# Patient Record
Sex: Female | Born: 1950
Health system: Southern US, Community
[De-identification: ages and names within clinical notes are randomized; demographics above are authoritative.]

## PROBLEM LIST (undated history)

## (undated) DIAGNOSIS — C801 Malignant (primary) neoplasm, unspecified: Secondary | ICD-10-CM

## (undated) DIAGNOSIS — E119 Type 2 diabetes mellitus without complications: Secondary | ICD-10-CM

## (undated) DIAGNOSIS — J45909 Unspecified asthma, uncomplicated: Secondary | ICD-10-CM

## (undated) DIAGNOSIS — R002 Palpitations: Secondary | ICD-10-CM

## (undated) DIAGNOSIS — L309 Dermatitis, unspecified: Secondary | ICD-10-CM

## (undated) DIAGNOSIS — R0602 Shortness of breath: Secondary | ICD-10-CM

## (undated) DIAGNOSIS — M1990 Unspecified osteoarthritis, unspecified site: Secondary | ICD-10-CM

## (undated) DIAGNOSIS — C50919 Malignant neoplasm of unspecified site of unspecified female breast: Secondary | ICD-10-CM

## (undated) DIAGNOSIS — K219 Gastro-esophageal reflux disease without esophagitis: Secondary | ICD-10-CM

## (undated) DIAGNOSIS — M8580 Other specified disorders of bone density and structure, unspecified site: Secondary | ICD-10-CM

## (undated) DIAGNOSIS — Z9221 Personal history of antineoplastic chemotherapy: Secondary | ICD-10-CM

## (undated) DIAGNOSIS — E7800 Pure hypercholesterolemia, unspecified: Secondary | ICD-10-CM

## (undated) DIAGNOSIS — C7800 Secondary malignant neoplasm of unspecified lung: Secondary | ICD-10-CM

## (undated) HISTORY — DX: Type 2 diabetes mellitus without complications: E11.9

## (undated) HISTORY — DX: Malignant (primary) neoplasm, unspecified: C80.1

## (undated) HISTORY — PX: MASTECTOMY: SHX3

## (undated) HISTORY — DX: Unspecified osteoarthritis, unspecified site: M19.90

## (undated) HISTORY — DX: Other specified disorders of bone density and structure, unspecified site: M85.80

---

## 1994-07-29 HISTORY — PX: SPINE SURGERY: SHX786

## 2003-07-30 DIAGNOSIS — C801 Malignant (primary) neoplasm, unspecified: Secondary | ICD-10-CM

## 2003-07-30 HISTORY — DX: Malignant (primary) neoplasm, unspecified: C80.1

## 2003-07-30 HISTORY — PX: BREAST SURGERY: SHX581

## 2004-03-01 ENCOUNTER — Encounter: Admission: RE | Admit: 2004-03-01 | Discharge: 2004-03-01 | Payer: Self-pay | Admitting: Family Medicine

## 2004-03-01 ENCOUNTER — Encounter (INDEPENDENT_AMBULATORY_CARE_PROVIDER_SITE_OTHER): Payer: Self-pay | Admitting: *Deleted

## 2004-03-12 ENCOUNTER — Encounter (HOSPITAL_COMMUNITY): Admission: RE | Admit: 2004-03-12 | Discharge: 2004-04-03 | Payer: Self-pay | Admitting: General Surgery

## 2004-03-20 ENCOUNTER — Encounter: Admission: RE | Admit: 2004-03-20 | Discharge: 2004-03-20 | Payer: Self-pay | Admitting: General Surgery

## 2004-03-22 ENCOUNTER — Ambulatory Visit (HOSPITAL_BASED_OUTPATIENT_CLINIC_OR_DEPARTMENT_OTHER): Admission: RE | Admit: 2004-03-22 | Discharge: 2004-03-22 | Payer: Self-pay | Admitting: General Surgery

## 2004-03-22 ENCOUNTER — Ambulatory Visit (HOSPITAL_COMMUNITY): Admission: RE | Admit: 2004-03-22 | Discharge: 2004-03-22 | Payer: Self-pay | Admitting: General Surgery

## 2004-03-22 ENCOUNTER — Encounter (INDEPENDENT_AMBULATORY_CARE_PROVIDER_SITE_OTHER): Payer: Self-pay | Admitting: *Deleted

## 2004-04-11 ENCOUNTER — Encounter: Payer: Self-pay | Admitting: Cardiovascular Disease

## 2004-04-11 ENCOUNTER — Ambulatory Visit: Admission: RE | Admit: 2004-04-11 | Discharge: 2004-04-11 | Payer: Self-pay | Admitting: Oncology

## 2004-04-16 ENCOUNTER — Ambulatory Visit (HOSPITAL_COMMUNITY): Admission: RE | Admit: 2004-04-16 | Discharge: 2004-04-16 | Payer: Self-pay | Admitting: Oncology

## 2004-04-16 ENCOUNTER — Ambulatory Visit: Admission: RE | Admit: 2004-04-16 | Discharge: 2004-06-05 | Payer: Self-pay | Admitting: Radiation Oncology

## 2004-05-02 ENCOUNTER — Ambulatory Visit (HOSPITAL_COMMUNITY): Admission: RE | Admit: 2004-05-02 | Discharge: 2004-05-02 | Payer: Self-pay | Admitting: Family Medicine

## 2004-05-02 ENCOUNTER — Ambulatory Visit (HOSPITAL_COMMUNITY): Admission: RE | Admit: 2004-05-02 | Discharge: 2004-05-02 | Payer: Self-pay | Admitting: Oncology

## 2004-05-07 ENCOUNTER — Ambulatory Visit (HOSPITAL_BASED_OUTPATIENT_CLINIC_OR_DEPARTMENT_OTHER): Admission: RE | Admit: 2004-05-07 | Discharge: 2004-05-07 | Payer: Self-pay | Admitting: General Surgery

## 2004-05-07 ENCOUNTER — Encounter (INDEPENDENT_AMBULATORY_CARE_PROVIDER_SITE_OTHER): Payer: Self-pay | Admitting: Specialist

## 2004-05-07 ENCOUNTER — Ambulatory Visit (HOSPITAL_COMMUNITY): Admission: RE | Admit: 2004-05-07 | Discharge: 2004-05-07 | Payer: Self-pay | Admitting: General Surgery

## 2004-05-10 ENCOUNTER — Encounter: Admission: RE | Admit: 2004-05-10 | Discharge: 2004-05-10 | Payer: Self-pay | Admitting: General Surgery

## 2004-05-22 ENCOUNTER — Encounter: Admission: RE | Admit: 2004-05-22 | Discharge: 2004-06-13 | Payer: Self-pay | Admitting: General Surgery

## 2004-05-31 ENCOUNTER — Ambulatory Visit: Payer: Self-pay | Admitting: Oncology

## 2004-07-16 ENCOUNTER — Ambulatory Visit: Payer: Self-pay | Admitting: Oncology

## 2004-09-03 ENCOUNTER — Ambulatory Visit: Payer: Self-pay | Admitting: Oncology

## 2004-10-23 ENCOUNTER — Ambulatory Visit (HOSPITAL_BASED_OUTPATIENT_CLINIC_OR_DEPARTMENT_OTHER): Admission: RE | Admit: 2004-10-23 | Discharge: 2004-10-23 | Payer: Self-pay | Admitting: General Surgery

## 2005-01-03 ENCOUNTER — Ambulatory Visit: Payer: Self-pay | Admitting: Oncology

## 2005-03-05 ENCOUNTER — Encounter: Admission: RE | Admit: 2005-03-05 | Discharge: 2005-03-05 | Payer: Self-pay | Admitting: Oncology

## 2005-04-04 ENCOUNTER — Ambulatory Visit: Payer: Self-pay | Admitting: Oncology

## 2005-06-21 ENCOUNTER — Ambulatory Visit: Payer: Self-pay | Admitting: Oncology

## 2005-08-01 ENCOUNTER — Encounter: Admission: RE | Admit: 2005-08-01 | Discharge: 2005-08-01 | Payer: Self-pay | Admitting: Family Medicine

## 2005-10-02 IMAGING — CR DG CHEST 2V
2 series · 2 of 2 positions shown · non-contrast
Comparison: none

CLINICAL DATA: Pre-op respiratory exam prior to right breast surgery for breast carcinoma. 
 CHEST X-RAY: 
 The heart size and mediastinal contours are unremarkable.  The lungs are clear.  The visualized skeleton is unremarkable.

[view not recorded (1 of 2)]
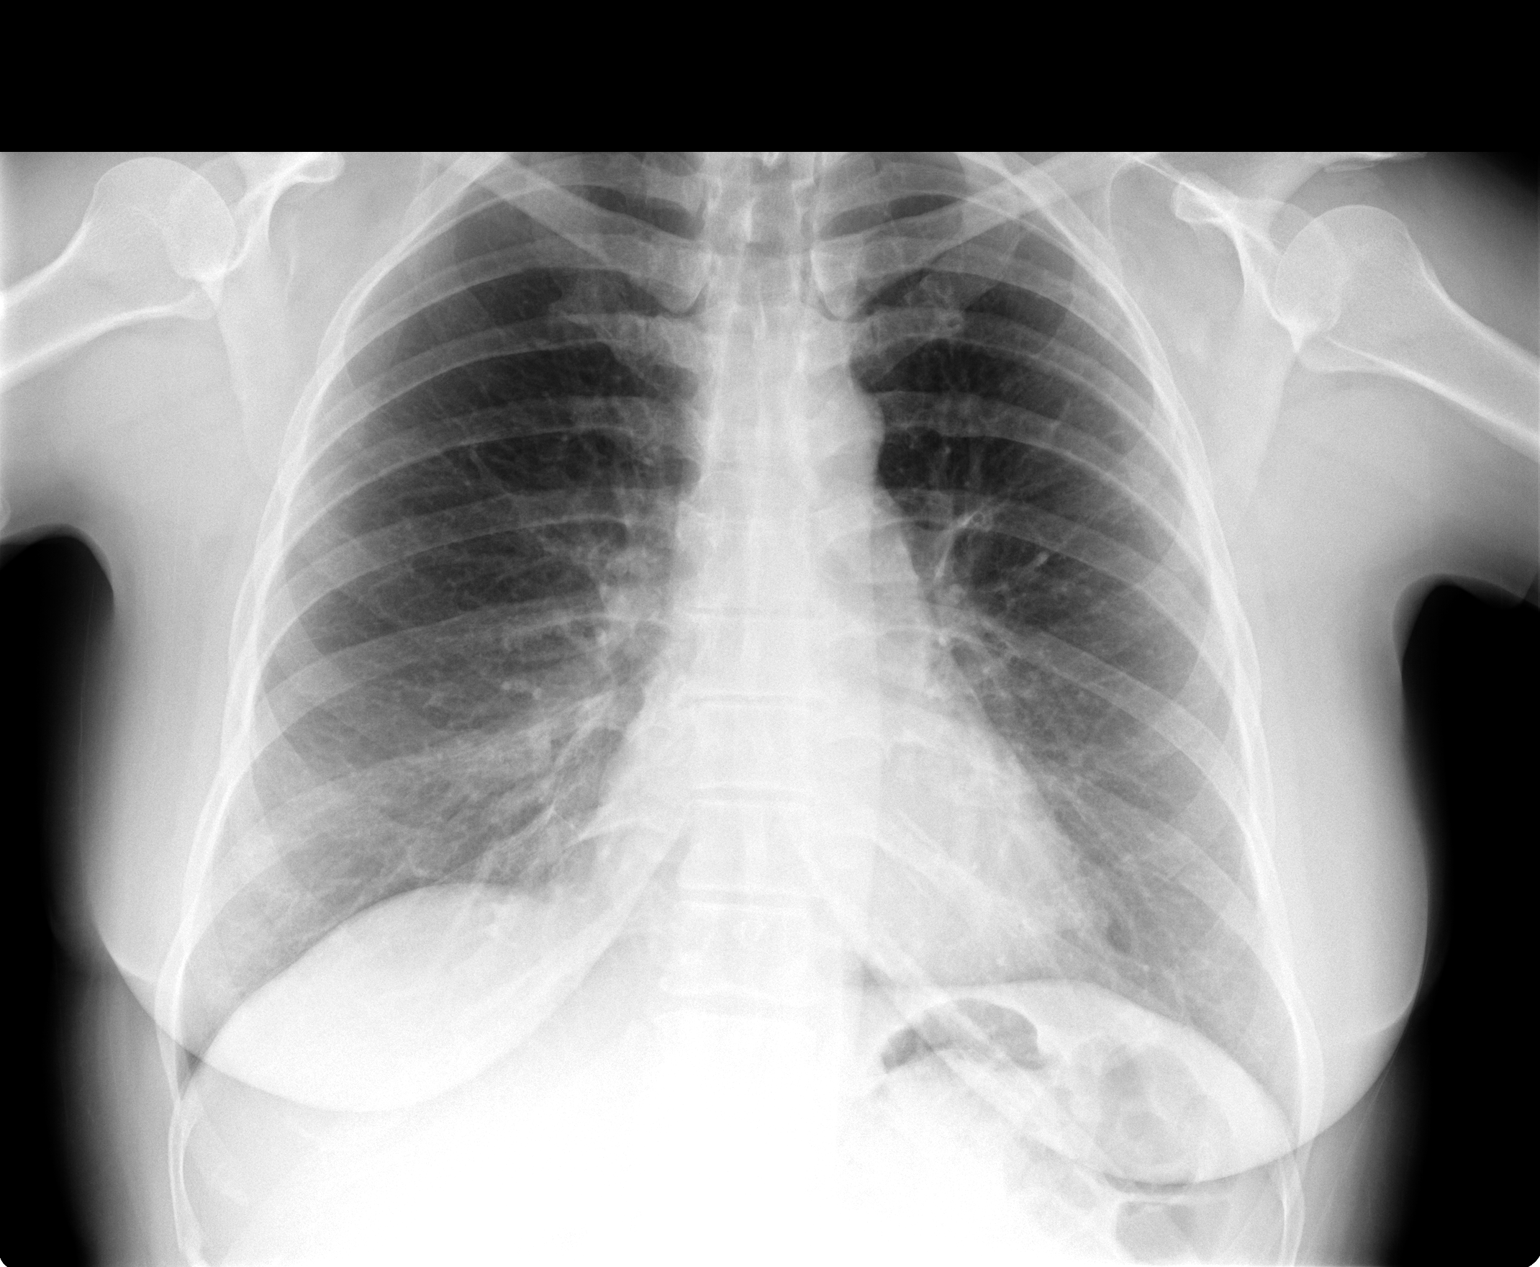

[view not recorded (2 of 2)]
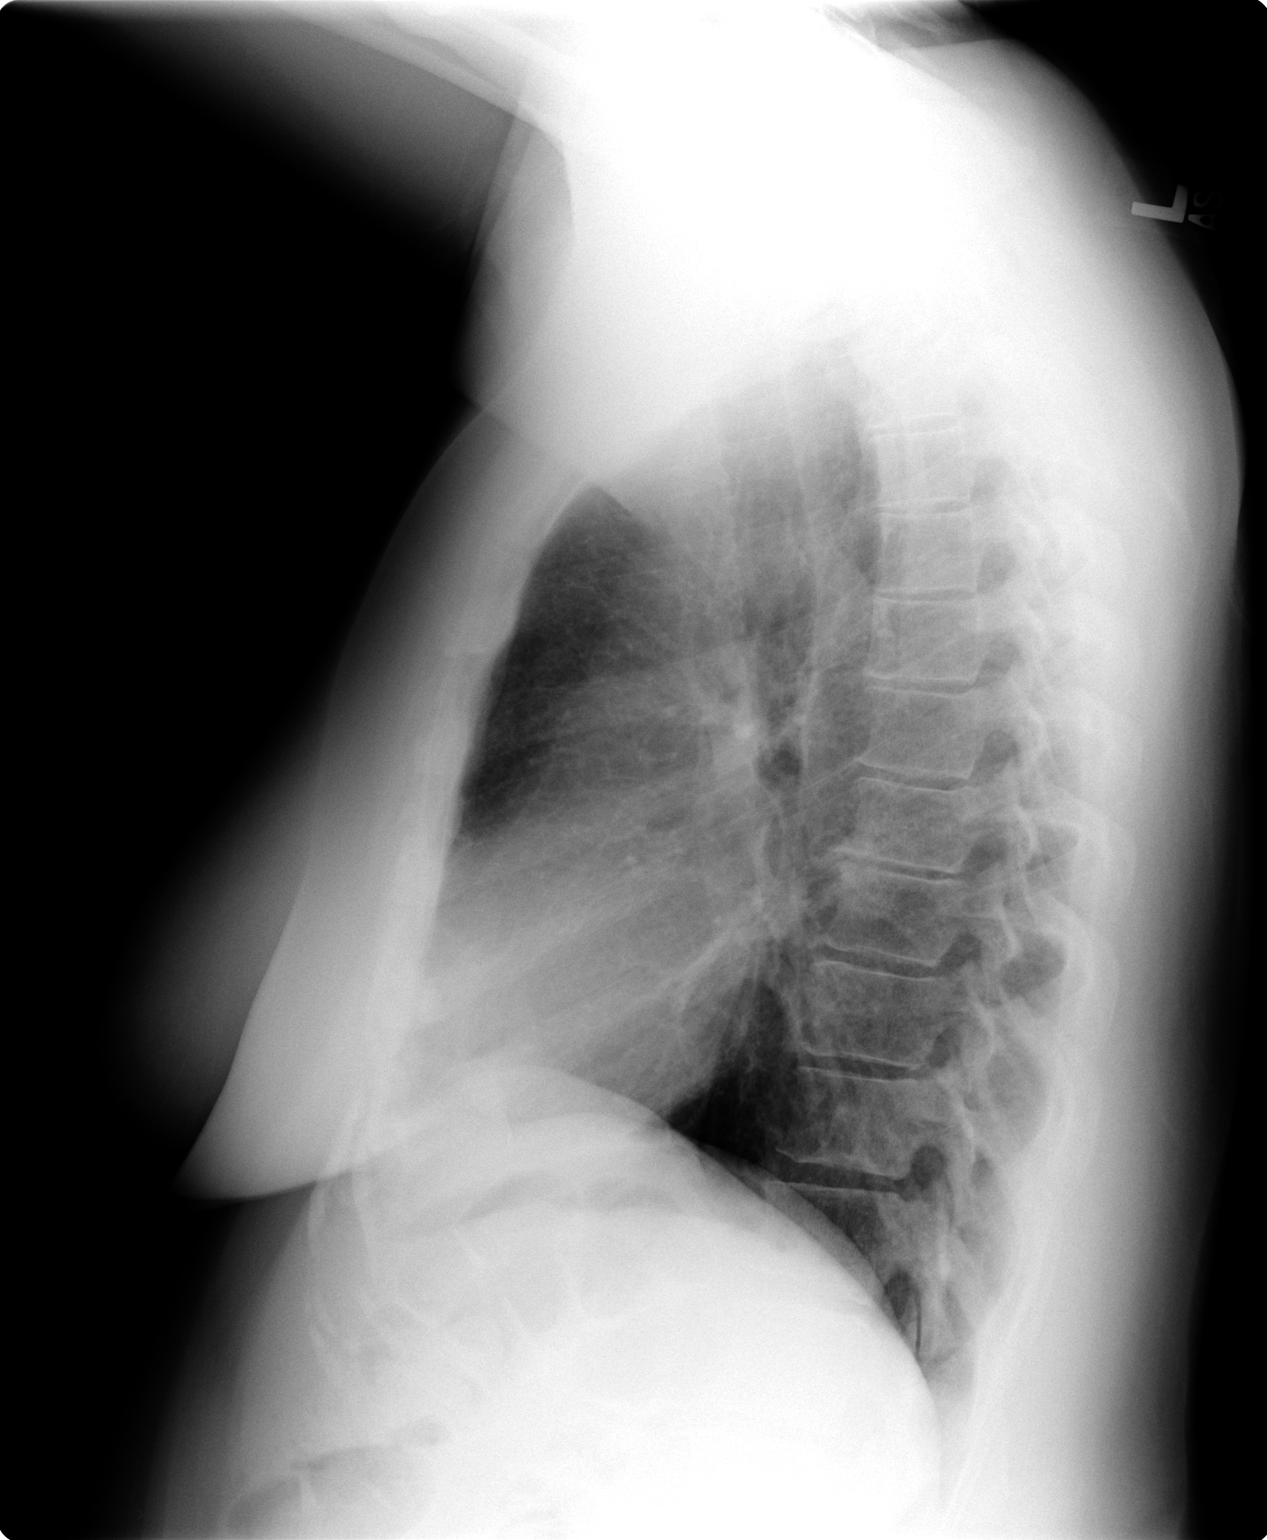

[2 of 2 positions shown; findings below may reference images not displayed]

IMPRESSION: No active lung disease.

## 2005-10-29 IMAGING — CT CT PELVIS W/ CM
1 of 4 series · 13 of 32 positions shown, 19 images · IV contrast (omnipaque)
Comparison: none

CLINICAL DATA: Breast carcinoma.  Staging. 
CT SCAN OF THE CHEST, ABDOMEN, AND PELVIS WITH CONTRAST
TECHNIQUE: Multidetector helical CT scanning obtained through the chest, abdomen, and pelvis following the administration of oral contrast and 150 cc of intravenous Omnipaque 300.  No immediate adverse reaction.

[Series 6: cap w/iv 5.0 b30f · axial · 0.74mm/px · z∈[-604,-84]mm · 13 of 122 slices shown, 19 images]
[im 9/122  soft-tissue]
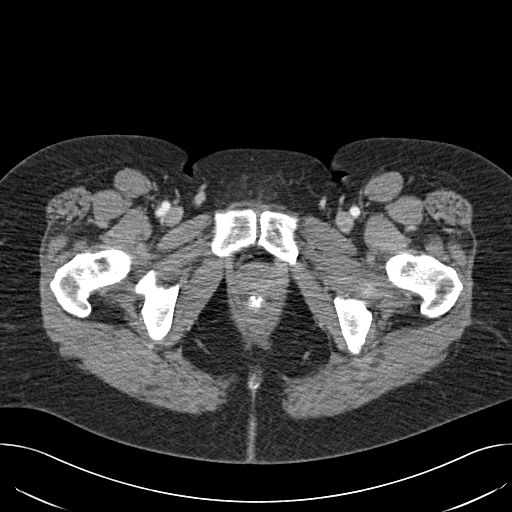
[im 9/122  bone]
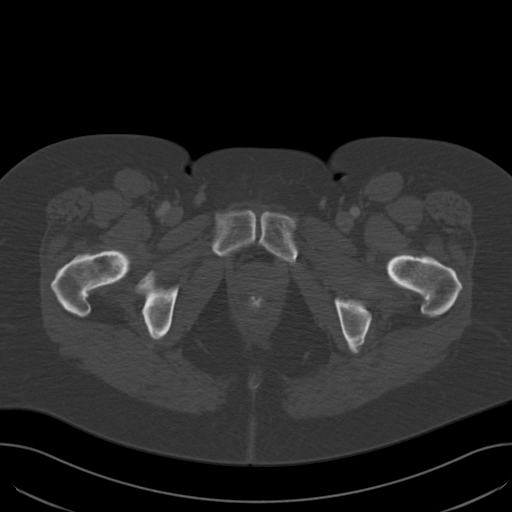
[im 17/122  soft-tissue]
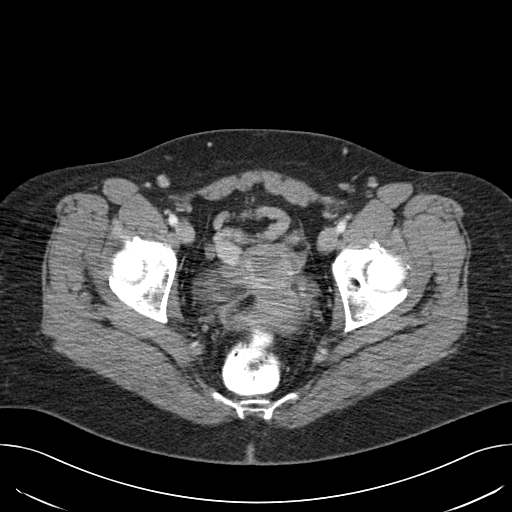
[im 25/122  soft-tissue]
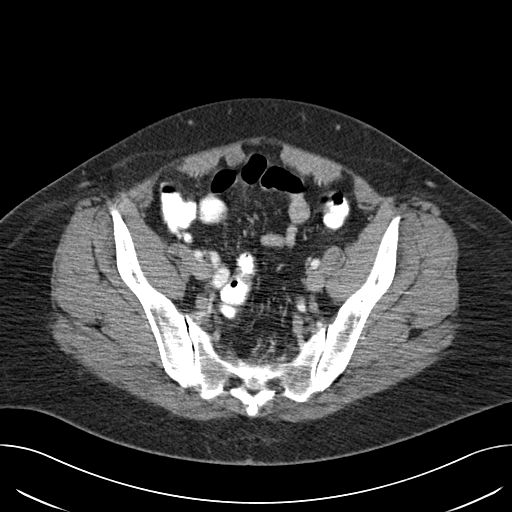
[im 33/122  soft-tissue]
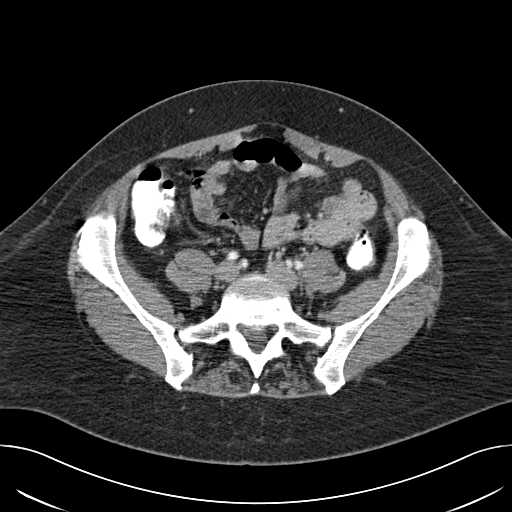
[im 41/122  soft-tissue]
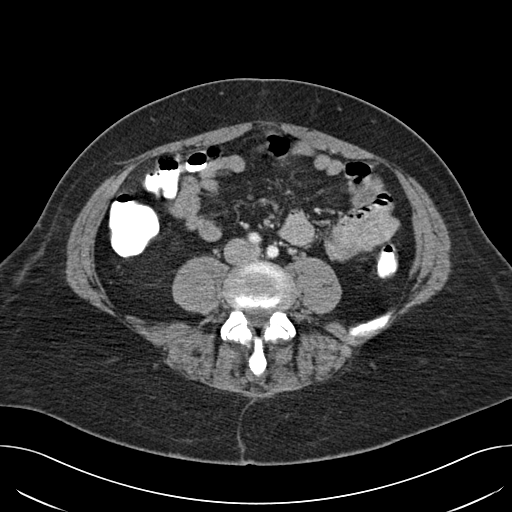
[im 49/122  soft-tissue]
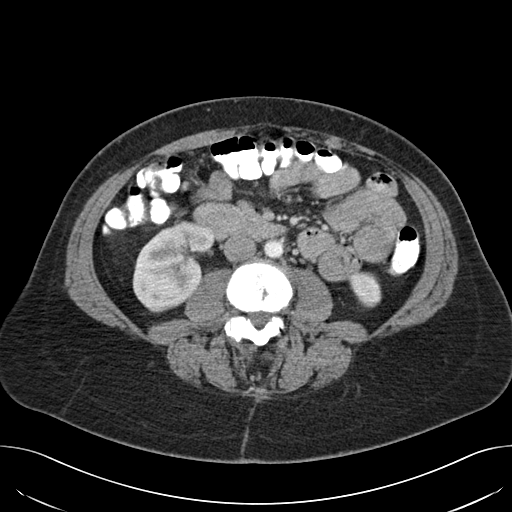
[im 65/122  soft-tissue]
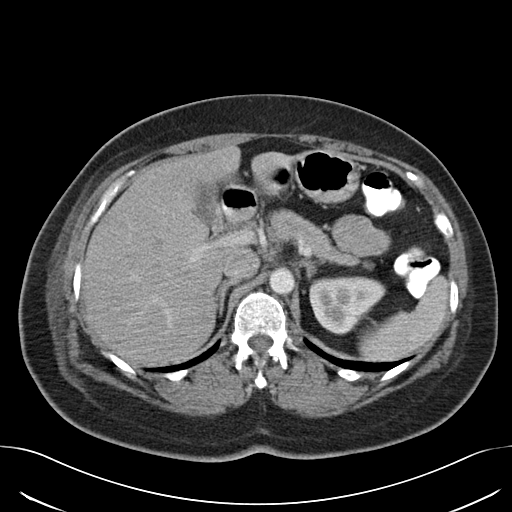
[im 73/122  soft-tissue]
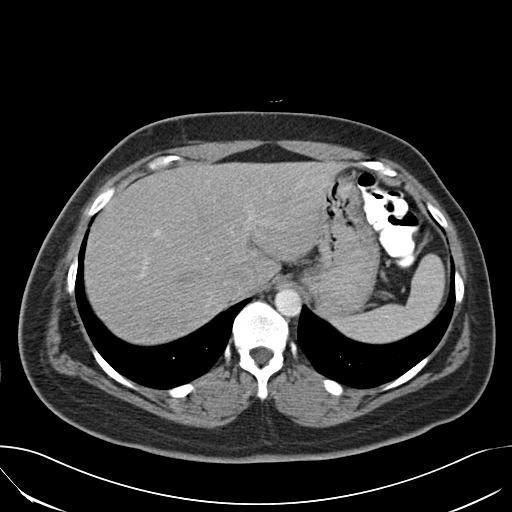
[im 81/122  soft-tissue]
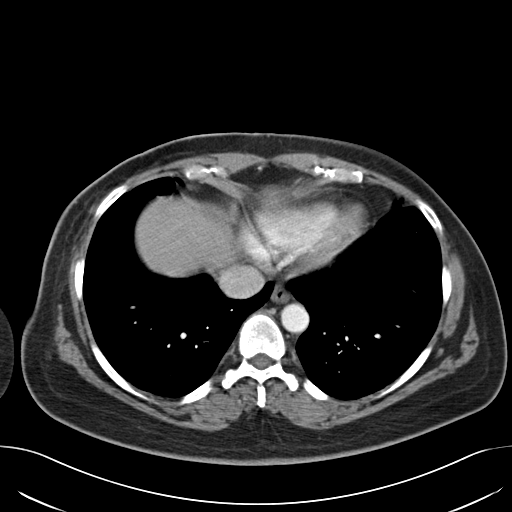
[im 81/122  bone]
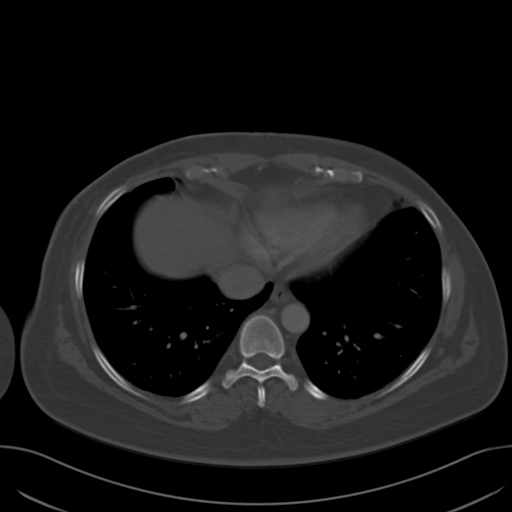
[im 89/122  soft-tissue]
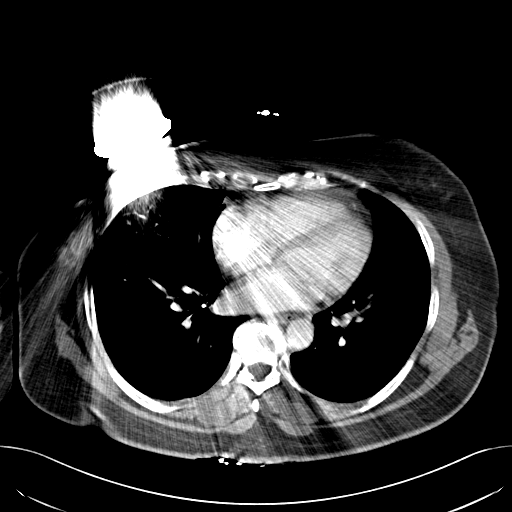
[im 89/122  lung]
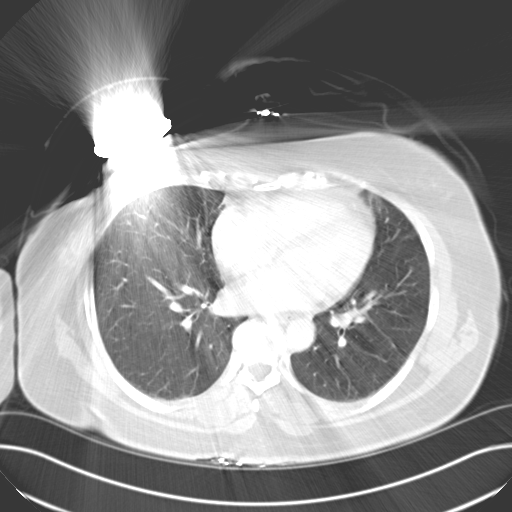
[im 97/122  soft-tissue]
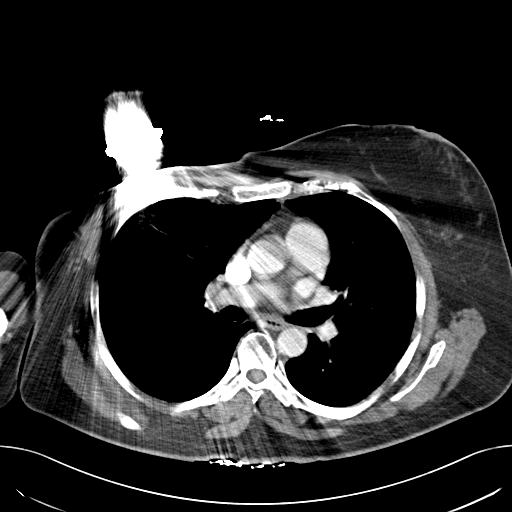
[im 97/122  lung]
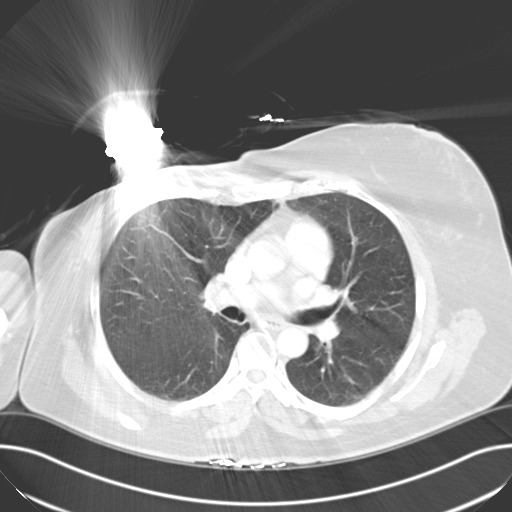
[im 105/122  soft-tissue]
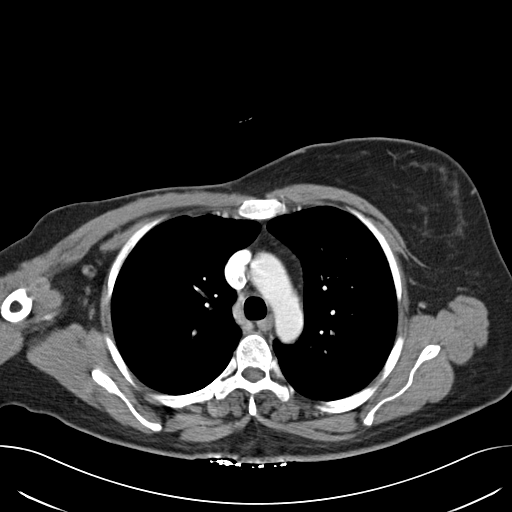
[im 105/122  lung]
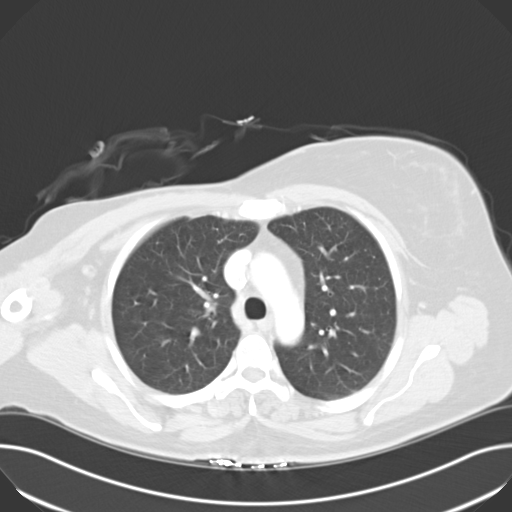
[im 113/122  soft-tissue]
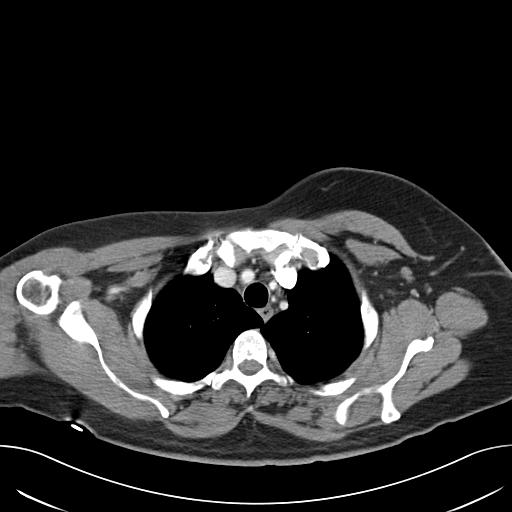
[im 113/122  lung]
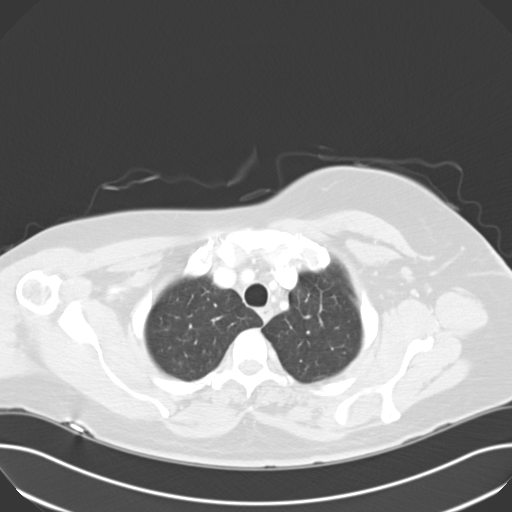

[13 of 32 positions shown; findings below may reference images not displayed]

FINDINGS: CT CHEST WITH CONTRAST 
The patient is status-post right mastectomy.  The heart and great vessels are unremarkable.  No evidence of pleural or pericardial effusions. No enlarged lymph nodes identified.  Postoperative changes in the right chest wall noted.  No focal bony lesion is identified.  The lungs are clear without evidence of pulmonary nodules or masses.  
IMPRESSION
Status-post right mastectomy.
No evidence of metastatic disease. 
CT ABDOMEN 
The liver, spleen, pancreas, kidneys, adrenal glands, gallbladder, and pancreas are unremarkable.  No evidence of free fluid, enlarged lymph nodes, or abdominal aortic aneurysm.  Visualized bowel is unremarkable.  
IMPRESSION
No acute abnormality.  Specifically, no evidence of metastatic disease. 
CT PELVIS 
The uterus is diminutive.   Adnexal regions are grossly unremarkable.  The visualized bowel and bladder are unremarkable as is the appendix.  No free fluid or enlarged lymph nodes.  Area of sclerosis within the anterior aspect of the left ileum noted.  
IMPRESSION
Nonspecific sclerosis within the anterior left iliac bone.  Consider follow up or further evaluation.
No acute abnomality.

## 2005-10-29 IMAGING — CT CT HEAD W/ CM
1 series · 15 of 26 positions shown, 19 images · IV contrast (omnipaque)
Comparison: none

CLINICAL DATA: Breast carcinoma.  Staging. 
CT SCAN OF THE CHEST, ABDOMEN, AND PELVIS WITH CONTRAST
TECHNIQUE: Multidetector helical CT scanning obtained through the chest, abdomen, and pelvis following the administration of oral contrast and 150 cc of intravenous Omnipaque 300.  No immediate adverse reaction.

[Series 2: head routi 5.0 h30s · axial · 0.43mm/px · z∈[-111,+4]mm · 15 of 26 slices shown, 19 images]
[im 2/26  brain]
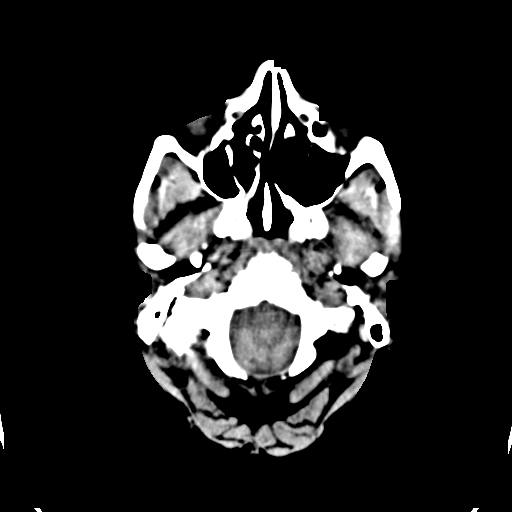
[im 2/26  bone]
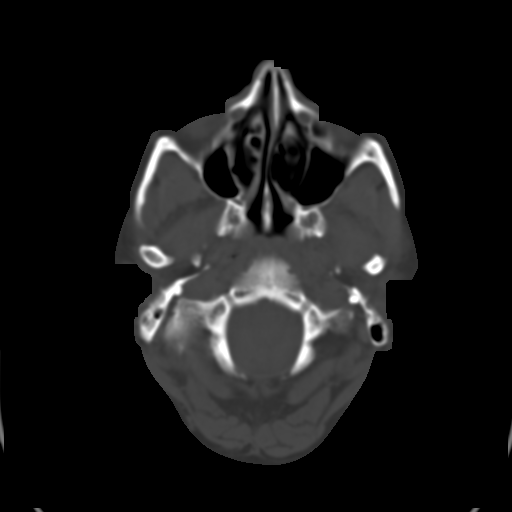
[im 4/26  brain]
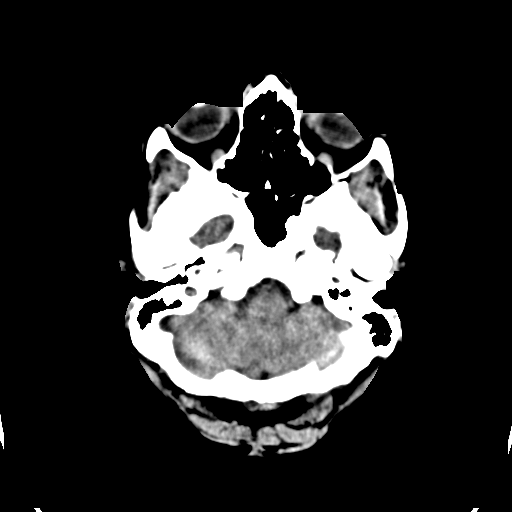
[im 6/26  brain]
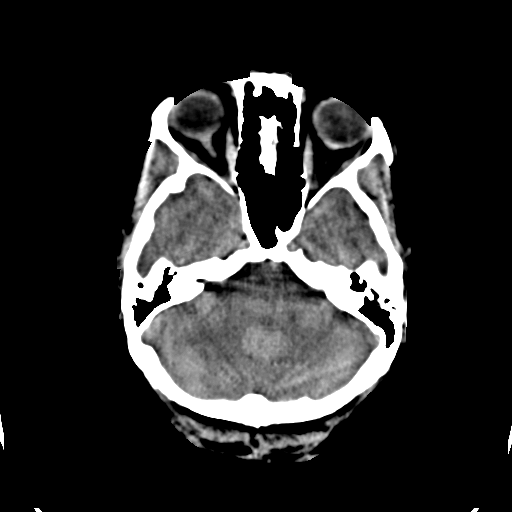
[im 7/26  brain]
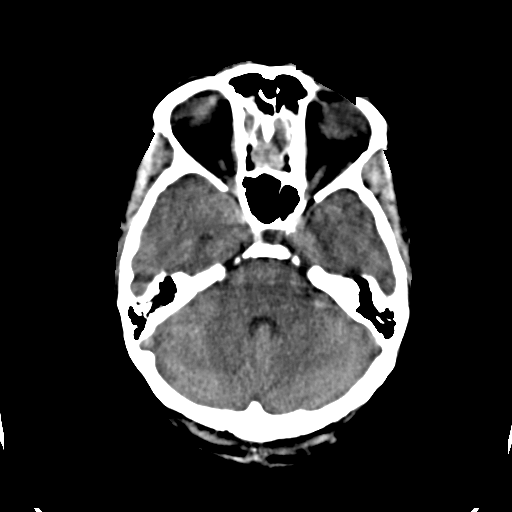
[im 9/26  brain]
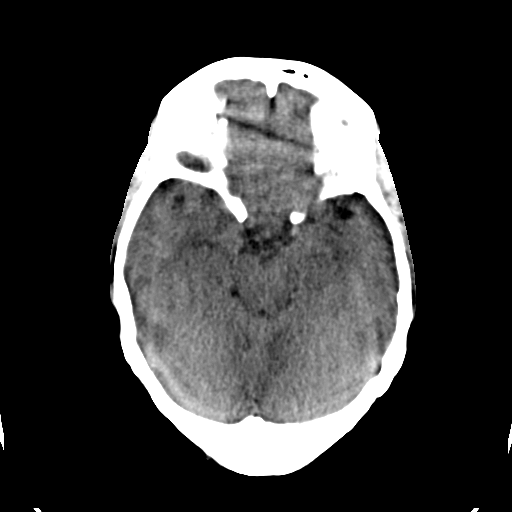
[im 9/26  bone]
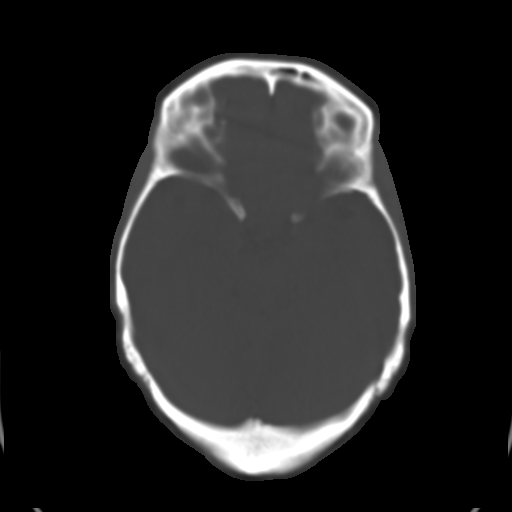
[im 10/26  brain]
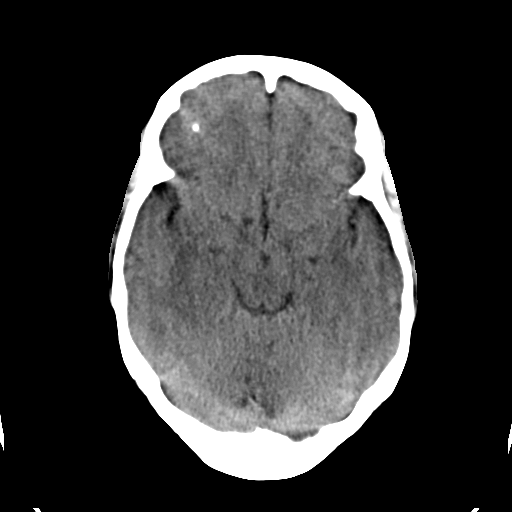
[im 12/26  brain]
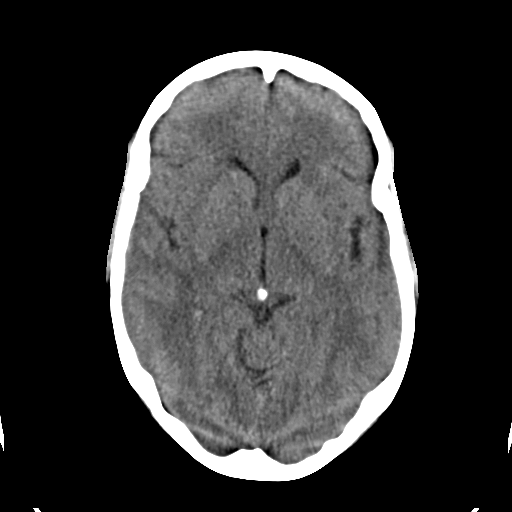
[im 14/26  brain]
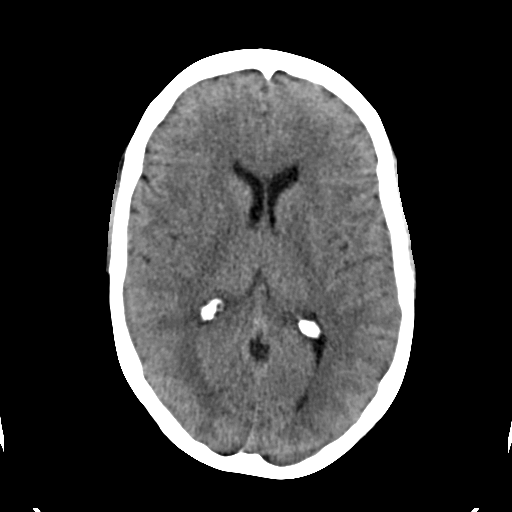
[im 15/26  brain]
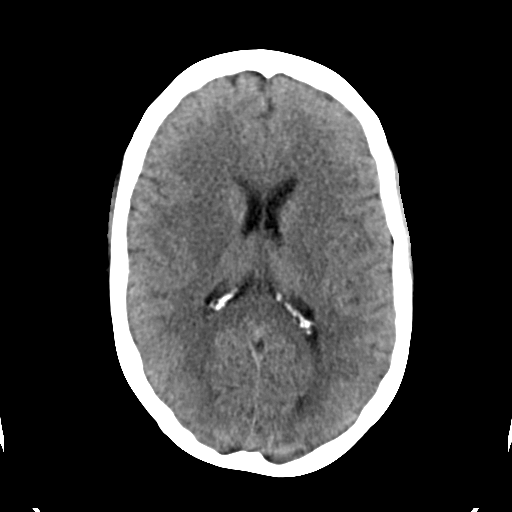
[im 15/26  bone]
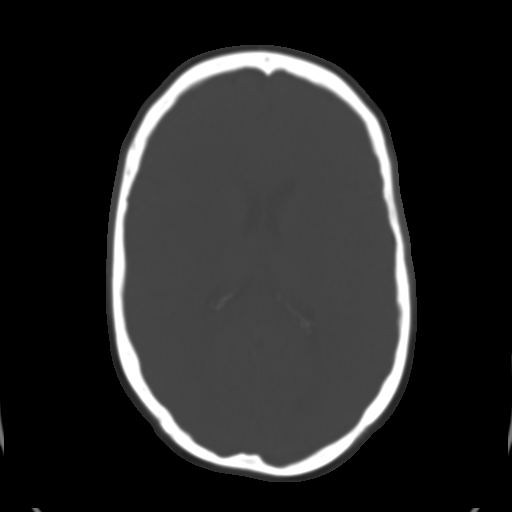
[im 17/26  brain]
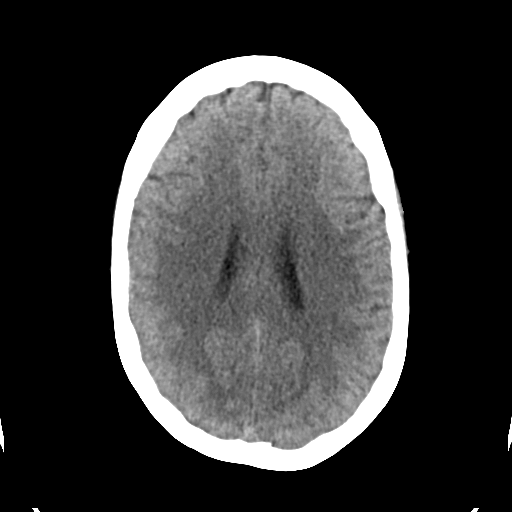
[im 18/26  brain]
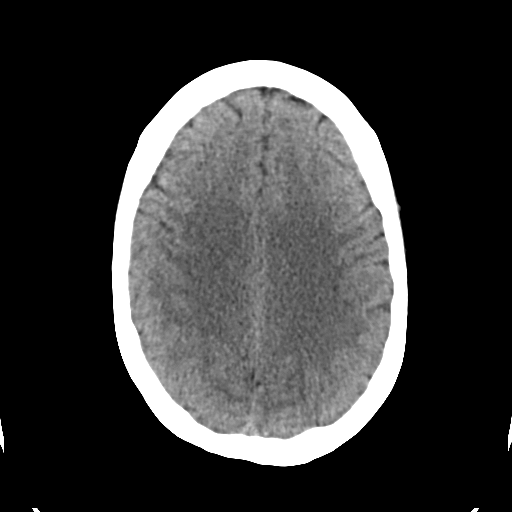
[im 20/26  brain]
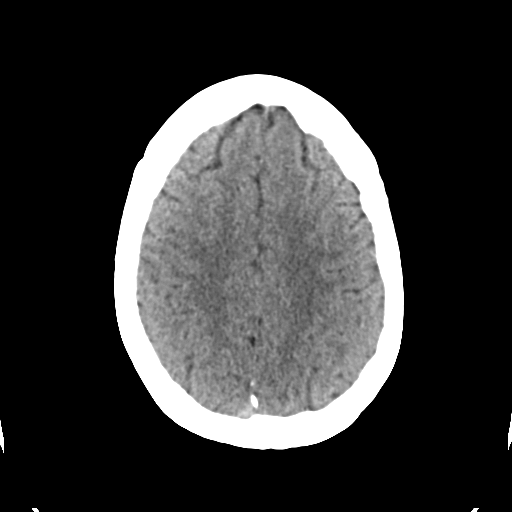
[im 22/26  brain]
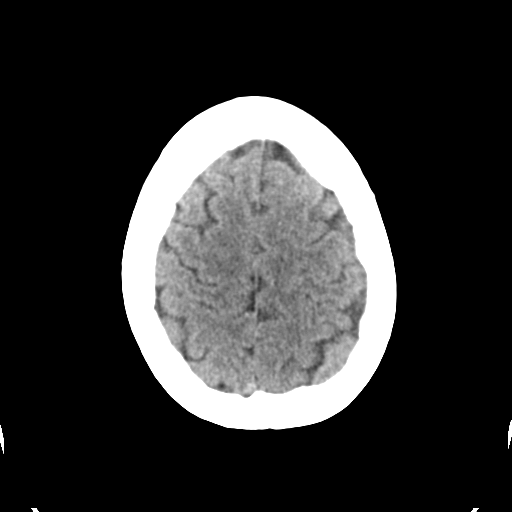
[im 22/26  bone]
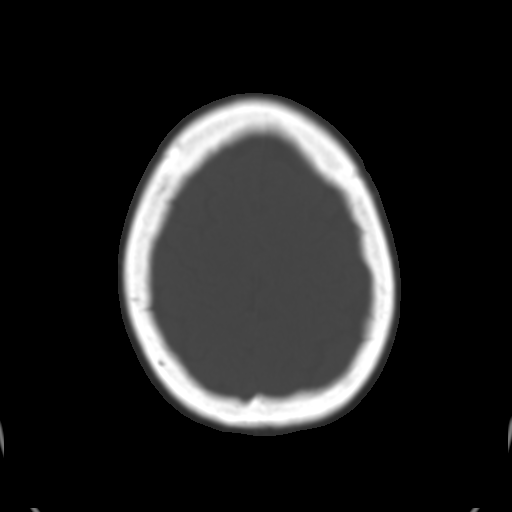
[im 23/26  brain]
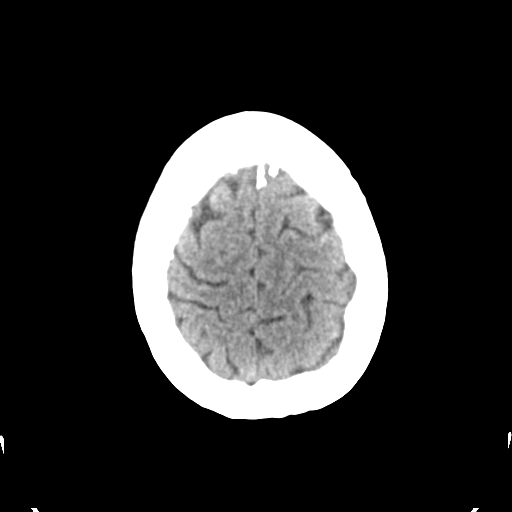
[im 25/26  brain]
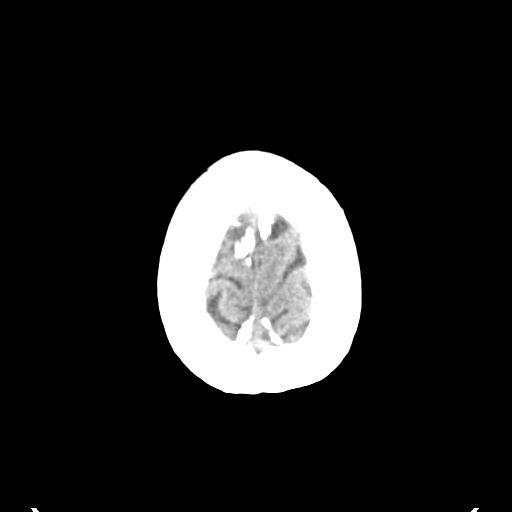

[15 of 26 positions shown; findings below may reference images not displayed]

FINDINGS: CT CHEST WITH CONTRAST 
The patient is status-post right mastectomy.  The heart and great vessels are unremarkable.  No evidence of pleural or pericardial effusions. No enlarged lymph nodes identified.  Postoperative changes in the right chest wall noted.  No focal bony lesion is identified.  The lungs are clear without evidence of pulmonary nodules or masses.  
IMPRESSION
Status-post right mastectomy.
No evidence of metastatic disease. 
CT ABDOMEN 
The liver, spleen, pancreas, kidneys, adrenal glands, gallbladder, and pancreas are unremarkable.  No evidence of free fluid, enlarged lymph nodes, or abdominal aortic aneurysm.  Visualized bowel is unremarkable.  
IMPRESSION
No acute abnormality.  Specifically, no evidence of metastatic disease. 
CT PELVIS 
The uterus is diminutive.   Adnexal regions are grossly unremarkable.  The visualized bowel and bladder are unremarkable as is the appendix.  No free fluid or enlarged lymph nodes.  Area of sclerosis within the anterior aspect of the left ileum noted.  
IMPRESSION
Nonspecific sclerosis within the anterior left iliac bone.  Consider follow up or further evaluation.
No acute abnomality.

## 2005-11-14 IMAGING — MR MR PELVIS W/O CM
4 of 5 series · 19 of 48 positions shown · non-contrast
Comparison: CT scan 04/16/2004, whole-body bone scan 04/16/2004.

CLINICAL DATA: Abnormal bone scan; history breast cancer

MRI PELVIS WITHOUT CONTRAST

[Series 2: T1 · axial · 4.0mm · 0.84mm/px · z∈[-114,+71]mm · 9 of 38 slices shown (1 of 2)]
[im 1/38]
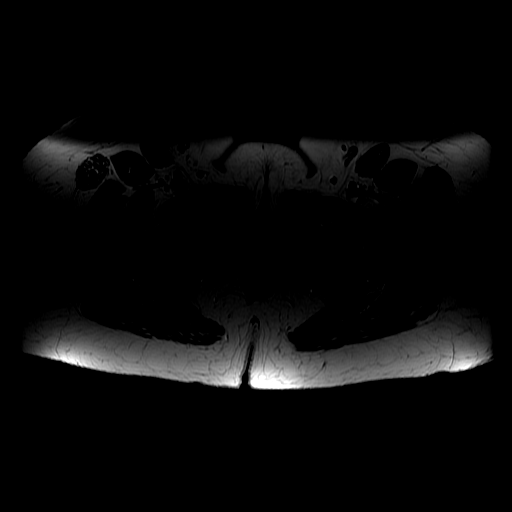
[im 7/38]
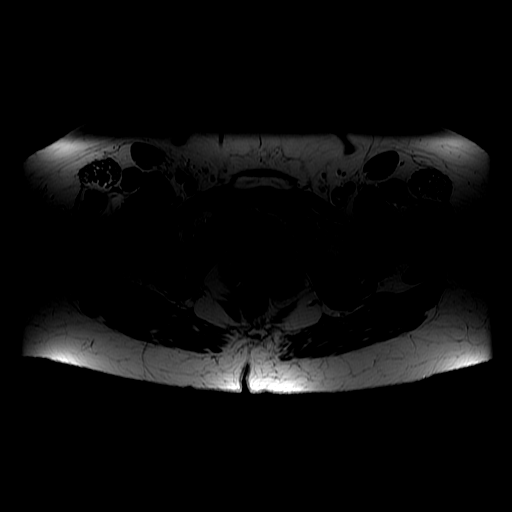
[im 11/38]
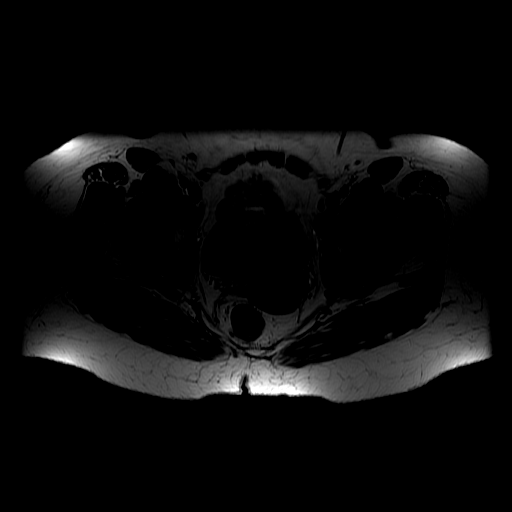
[im 17/38]
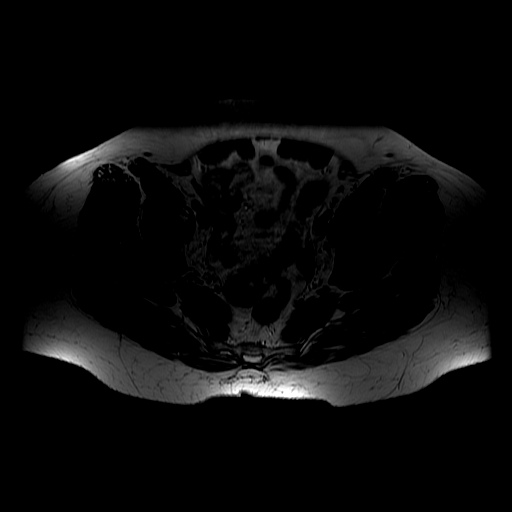
[im 21/38]
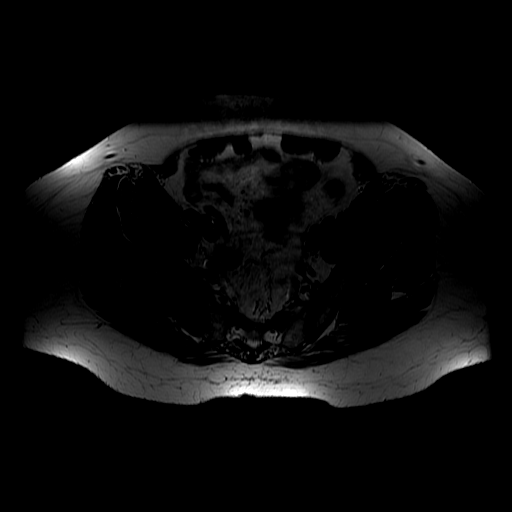
[im 27/38]
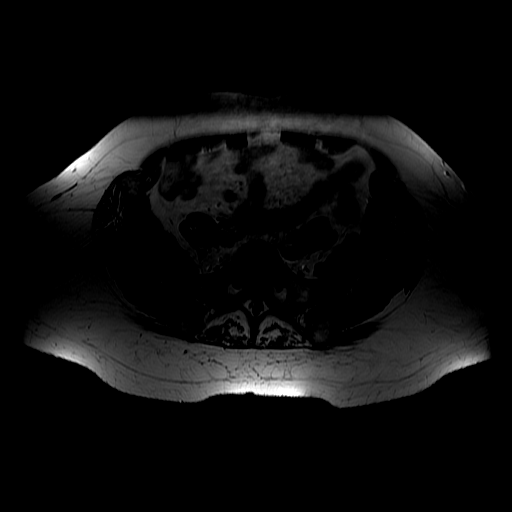
[im 31/38]
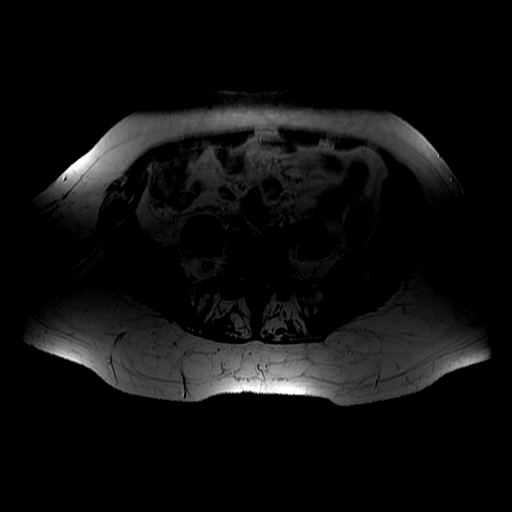
[im 34/38]
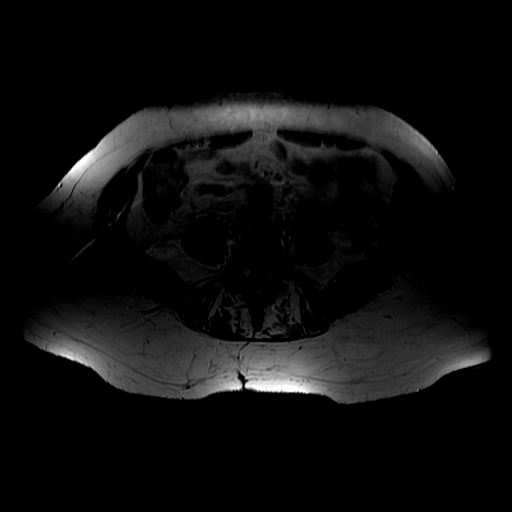
[im 38/38]
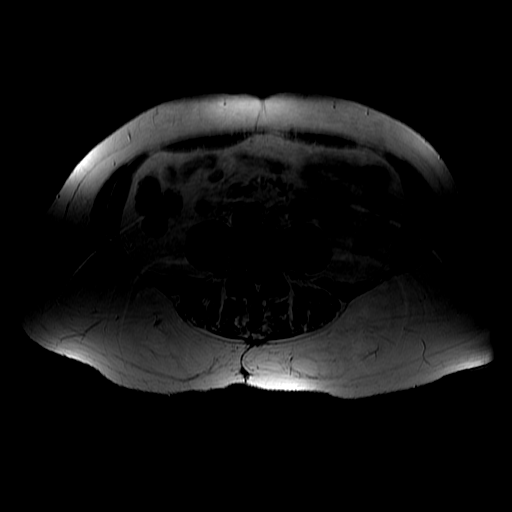

[Series 3: T1 · coronal · 4.5mm · 0.82mm/px · 4 of 30 slices shown (2 of 2)]
[im 1/30]
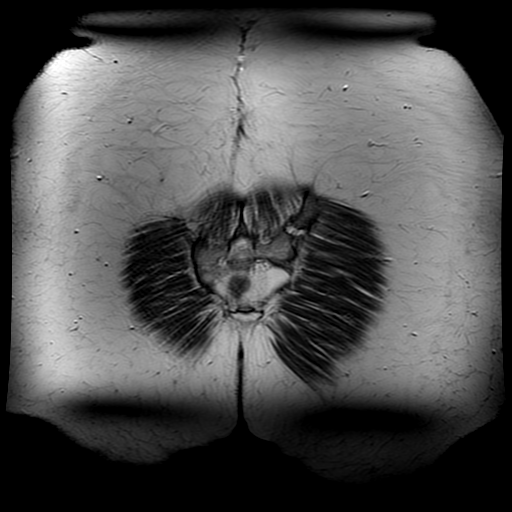
[im 4/30]
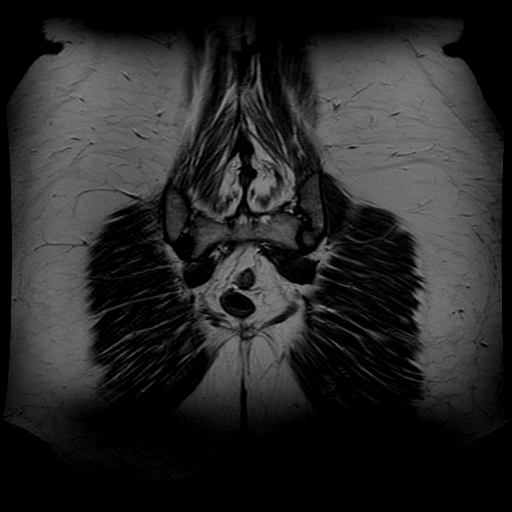
[im 15/30]
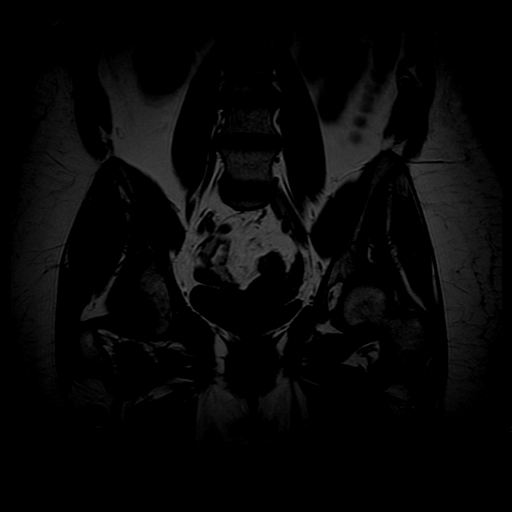
[im 26/30]
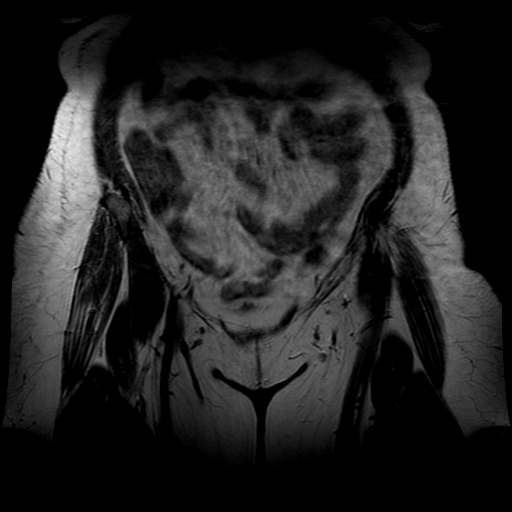

[Series 4: PD fat-sat · coronal · 4.0mm · 0.84mm/px · 3 of 30 slices shown]
[im 4/30]
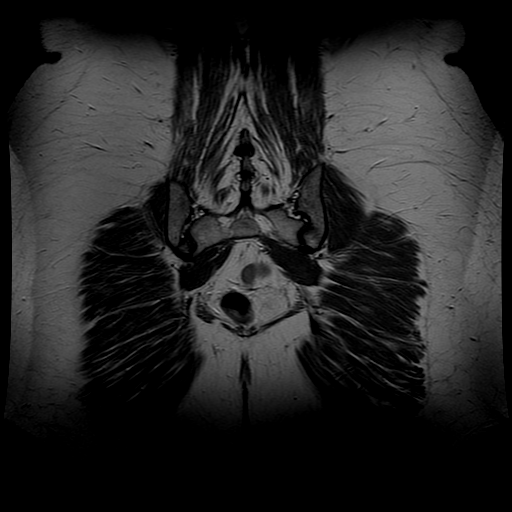
[im 15/30]
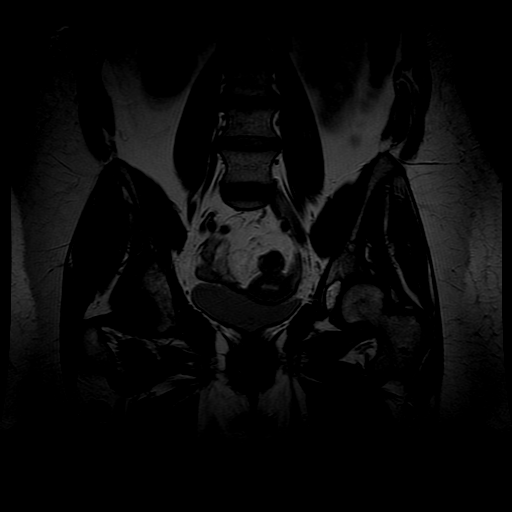
[im 26/30]
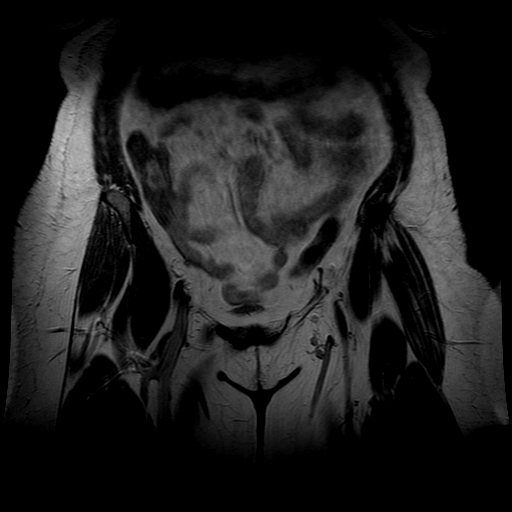

[Series 5: T2 fat-sat · coronal · 4.0mm · 0.84mm/px · 3 of 30 slices shown]
[im 4/30]
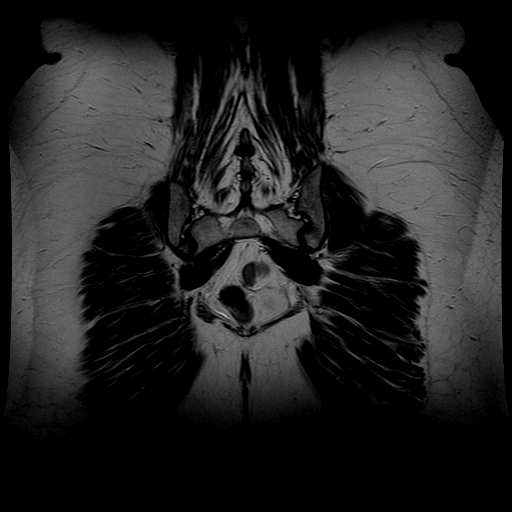
[im 15/30]
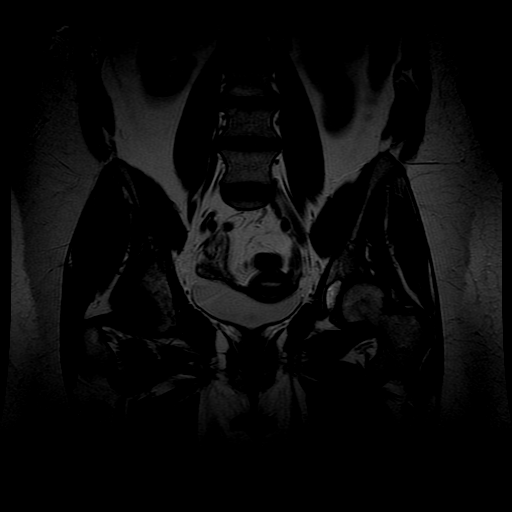
[im 26/30]
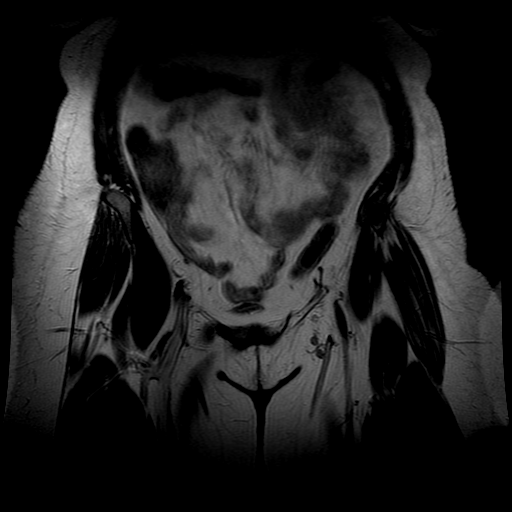

[19 of 48 positions shown; findings below may reference images not displayed]

FINDINGS: Unfortunately, the patient was in severe pain and could not finish the examination. She
did not receive contrast. T1, T2 and STIR imaging was performed. There is a focal area of low
signal intensity on all the pulse sequences in the left anterior-superior iliac spine area. This
corresponds to the lesion seen on the CT scan and bone scan. There is a central area of slight
lucency on the CT scan with surrounding sclerosis. I do not see any definite focus of increased
signal intensity on the STIR sequence. I think is it unlikely this is a metastatic focus but it is
possible. It may be an osteoid osteoma.

No significant intrapelvic findings are demonstrated. There is a rounded cyst associated with the
left ovary. There are degenerative type changes in the lumbar spine but no other bone lesions are
seen. 

IMPRESSION 

Limited MR examination of the pelvis because of the patient's pain. The lesion in the left
anterior/superior iliac spine area does not show increased STIR signal intensity. Is possible it
could be a sclerotic metastasis. Benign osteoid osteoma would be another possibility. Short-term
followup MR Soguel be helpful to document stability.

## 2005-11-19 IMAGING — CR DG CHEST 1V PORT
1 series · 1 of 1 positions shown · non-contrast
Comparison: none

CLINICAL DATA: Port-A-Cath placement.
 CHEST PORTABLE ONE VIEW
 AP view of the chest made with the portable apparatus on 05/07/04 compared with the prior study of 03/20/04.  Since the previous study, postoperative change is noted related to the right breast including axillary surgical clips.  No change is noted in the lung fields.  The Port-A-Cath has been inserted from the left subclavian area, with the tip in the superior vena cava, thought to be in satisfactory position.  No evidence of pneumothorax is noted.  
 IMPRESSION
 Port-A-Cath thought to be in satisfactory position and postoperative changes noted related to the right breast and without other significant change.

[view not recorded]
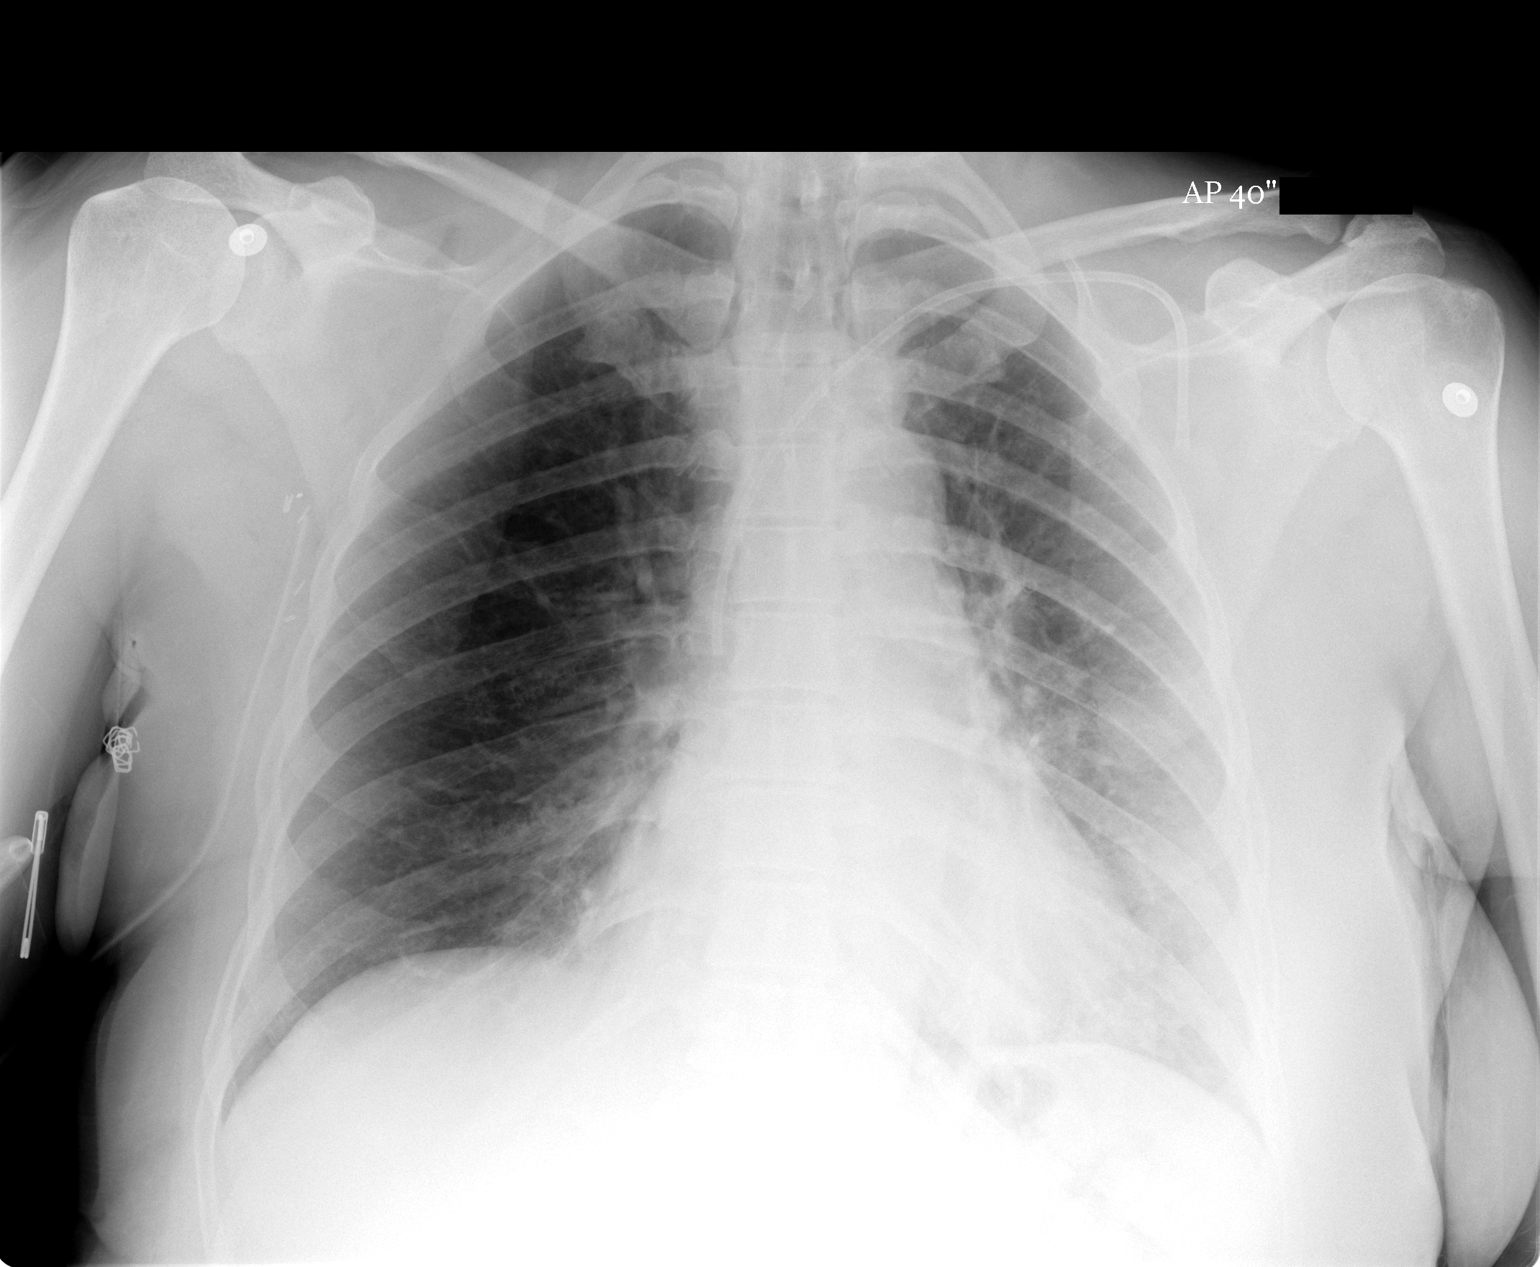

[1 of 1 positions shown; findings below may reference images not displayed]

## 2005-11-22 IMAGING — CR DG CHEST 2V
2 series · 2 of 2 positions shown · non-contrast
Comparison: none

CLINICAL DATA: Short of breath, 05/07/04 insertion Port-A-Cath, right mastectomy [DATE], former smoker. 
CHEST, TWO VIEWS: 
Comparison is made with prior [REDACTED] portable chest x-ray 05/07/04 with stable position of left subclavian Port-A-Cath ending in the superior vena cava.  Comparison is also made with [REDACTED] chest CT 04/16/04 with stable right post mastectomy and axillary lymph node resection.   prior [REDACTED] chest x-ray 03/20/04 lungs remain clear.  Heart size remains normal.  Mediastinum, hila, pleura, and osseous structures are otherwise stable and unremarkable.

[view not recorded (1 of 2)]
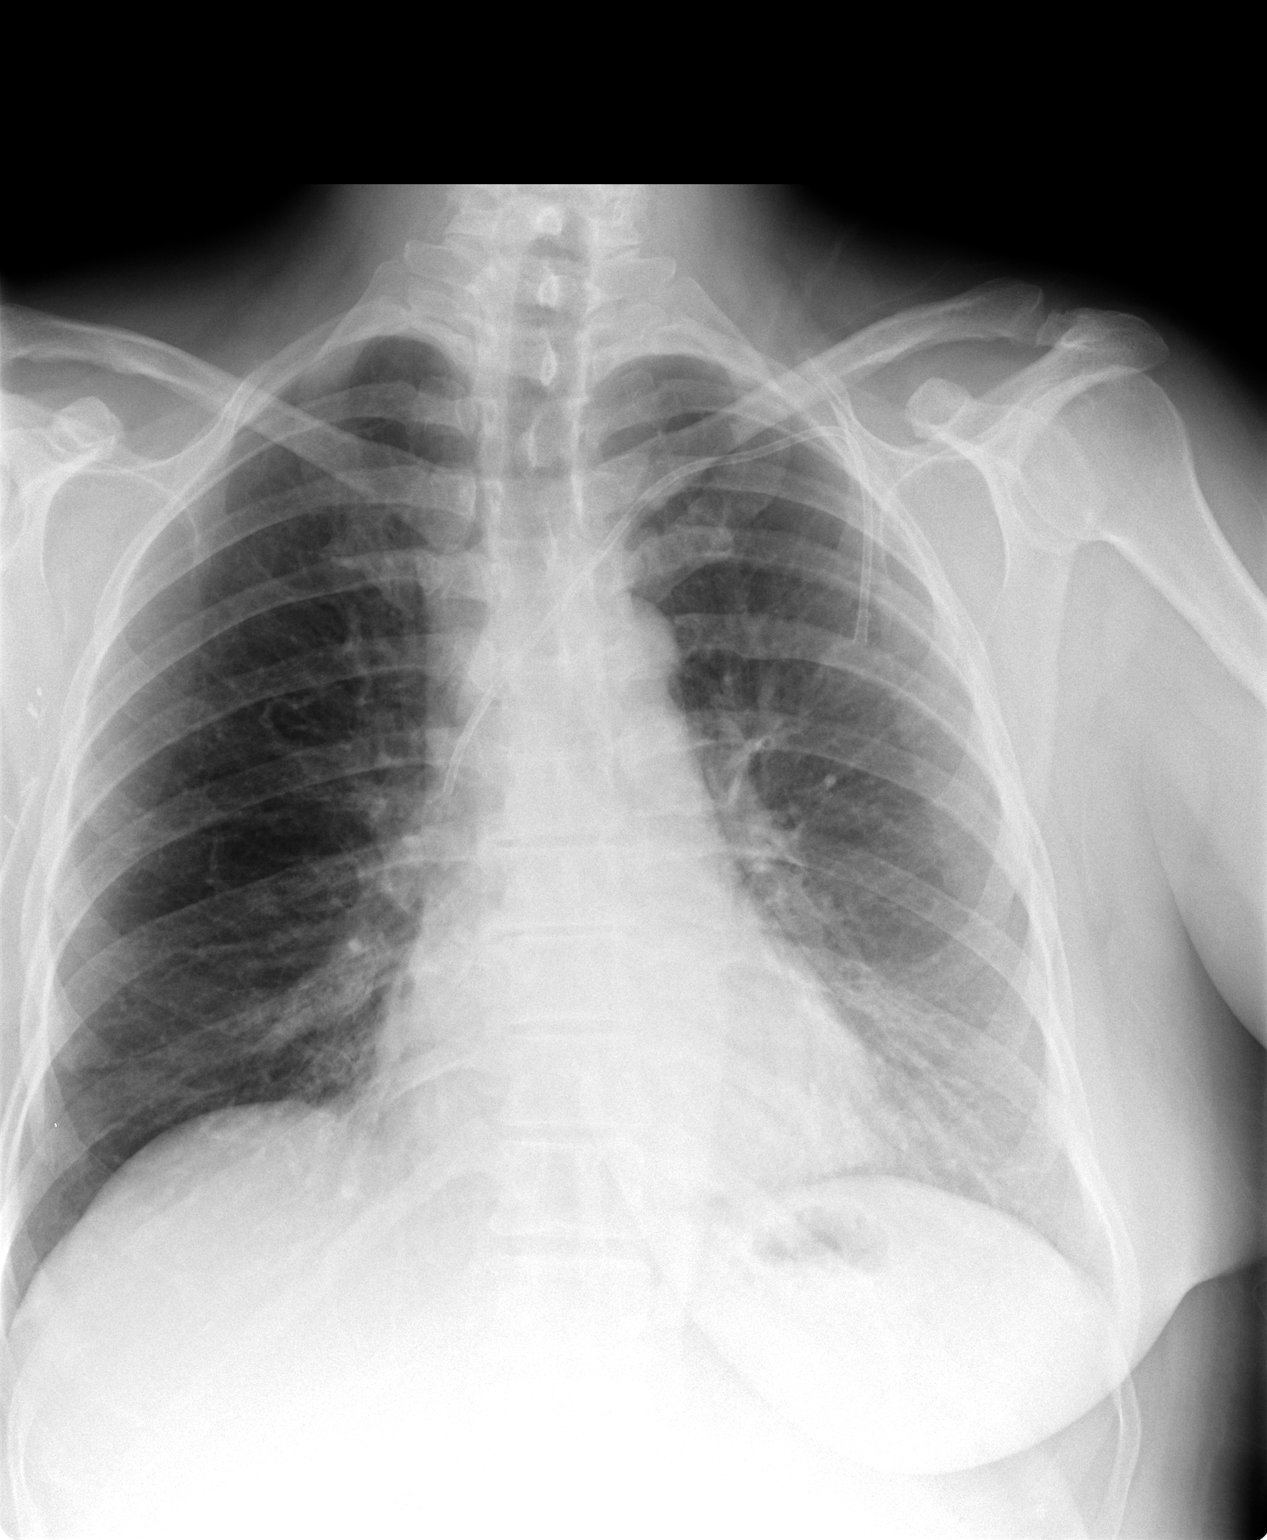

[view not recorded (2 of 2)]
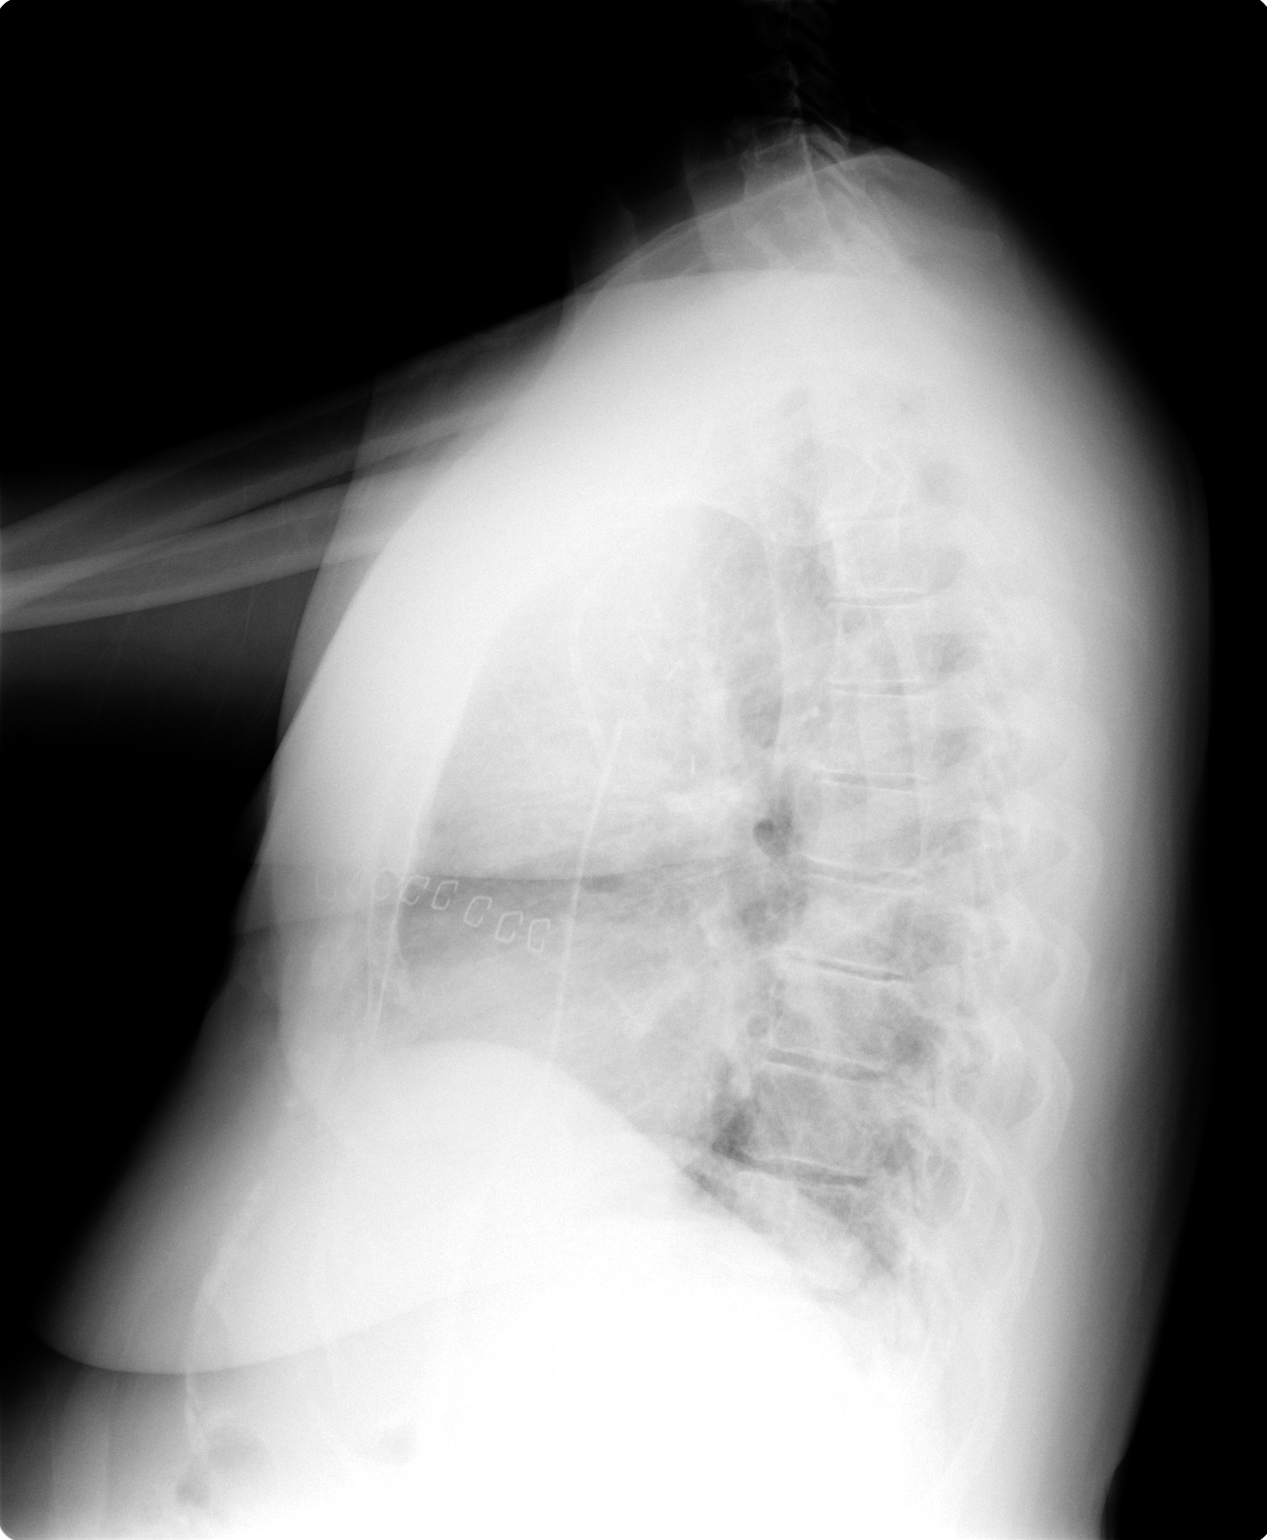

[2 of 2 positions shown; findings below may reference images not displayed]

IMPRESSION: Since prior studies: 
1.  Satisfactory position Port-A-Cath with right mastectomy. 
2.  No active disease.

## 2006-03-06 ENCOUNTER — Encounter: Admission: RE | Admit: 2006-03-06 | Discharge: 2006-03-06 | Payer: Self-pay | Admitting: Oncology

## 2007-02-13 IMAGING — CT CT ABDOMEN W/O CM
1 series · 15 of 32 positions shown, 19 images · non-contrast
Comparison: [REDACTED] abdominal CT on 04/16/04.
COMPARISON: [REDACTED] pelvic CT on 04/16/04.

CLINICAL DATA: Right mastectomy for breast cancer.  Left groin flank pain, low grade fever, hematuria. 
 ABDOMEN CT WITHOUT CONTRAST ? 08/01/05:
TECHNIQUE: Multidetector CT imaging of the abdomen was performed following the standard protocol without IV contrast.
TECHNIQUE: Multidetector CT imaging of the pelvis was performed following the standard protocol without IV contrast.

[Series 2: renal stone · axial · 0.78mm/px · z∈[-378,-68]mm · 15 of 70 slices shown, 19 images]
[im 5/70  soft-tissue]
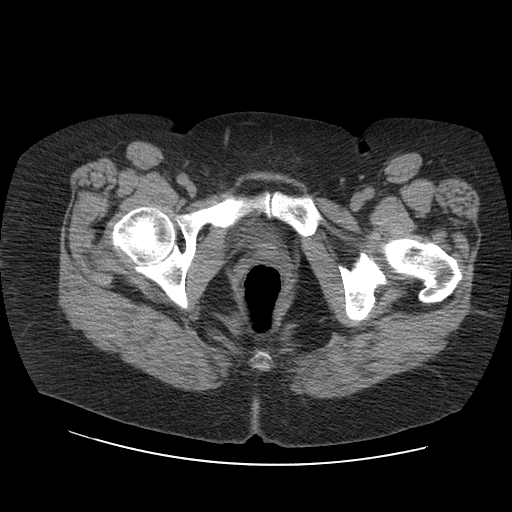
[im 5/70  bone]
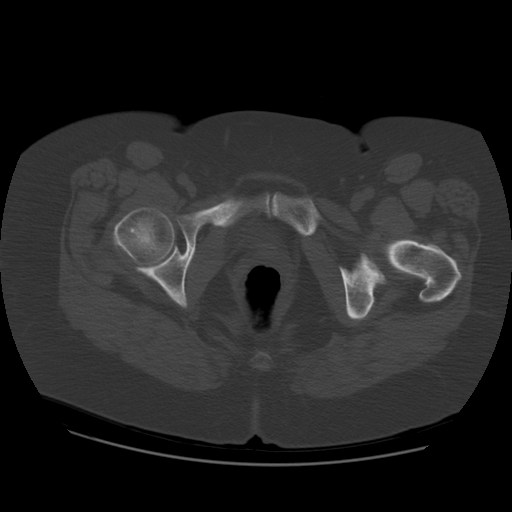
[im 9/70  soft-tissue]
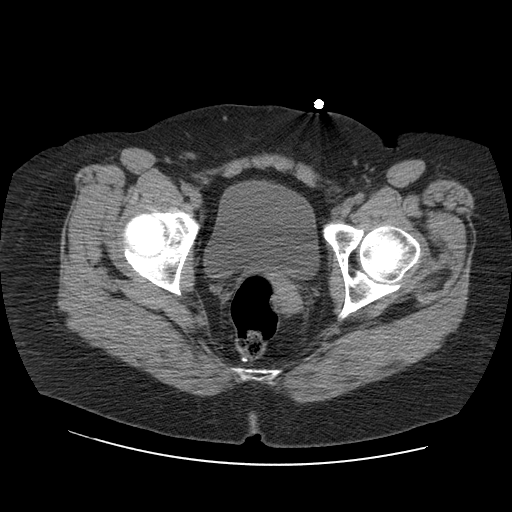
[im 14/70  soft-tissue]
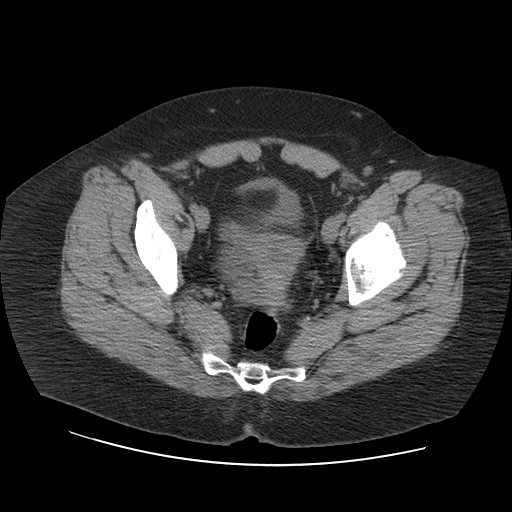
[im 21/70  soft-tissue]
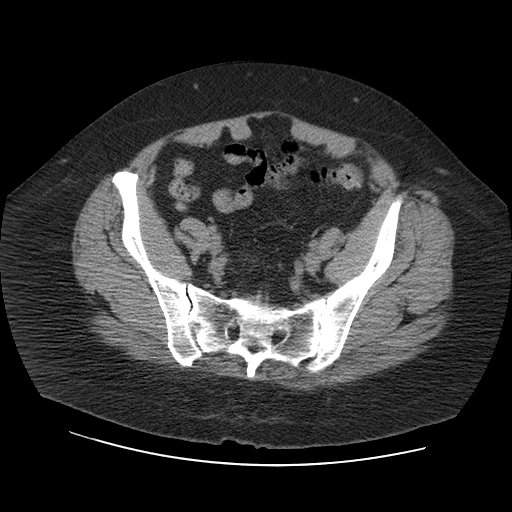
[im 25/70  soft-tissue]
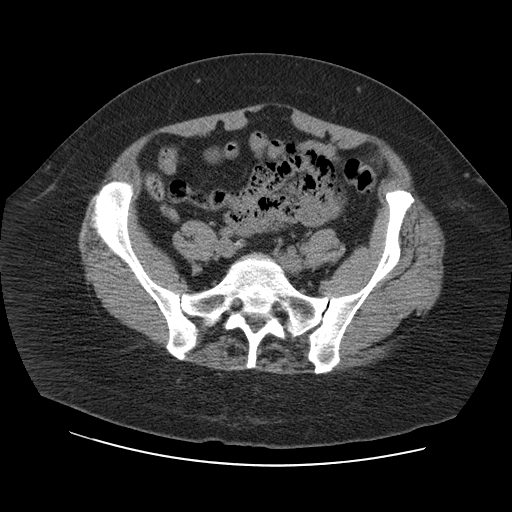
[im 29/70  soft-tissue]
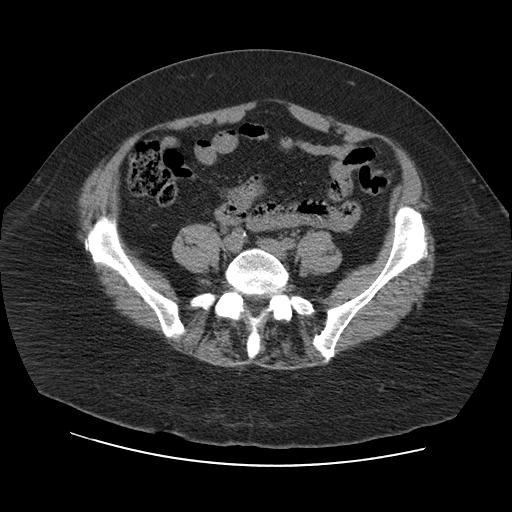
[im 36/70  soft-tissue]
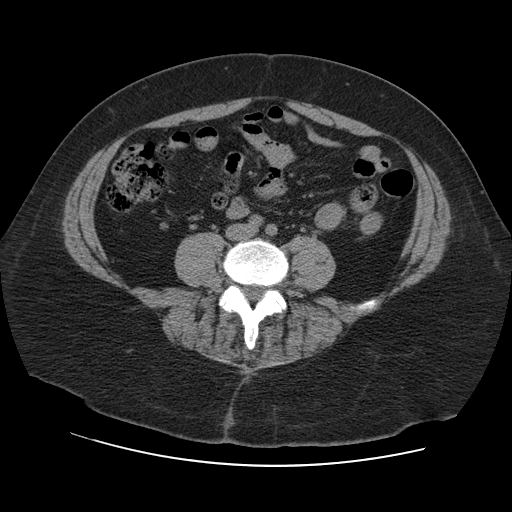
[im 41/70  soft-tissue]
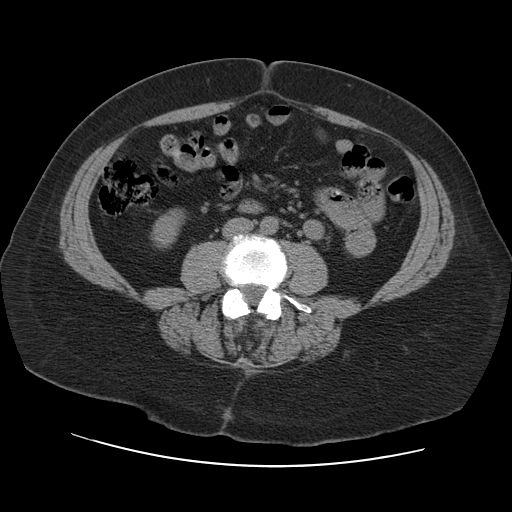
[im 45/70  soft-tissue]
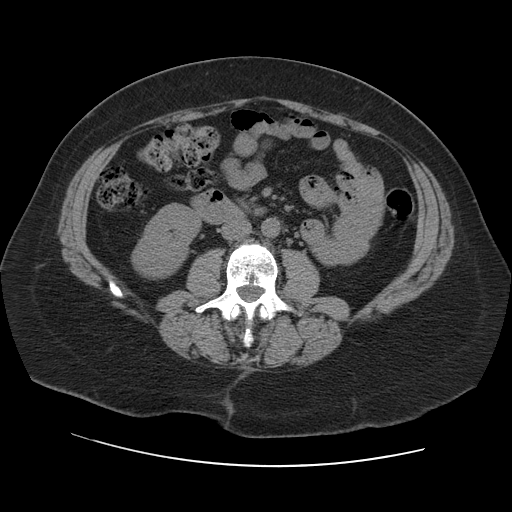
[im 45/70  bone]
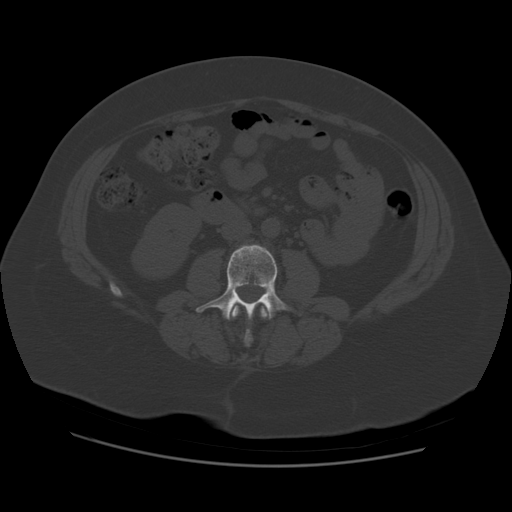
[im 49/70  soft-tissue]
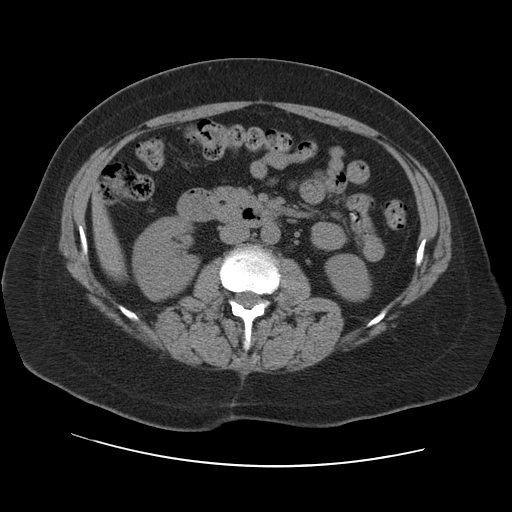
[im 56/70  soft-tissue]
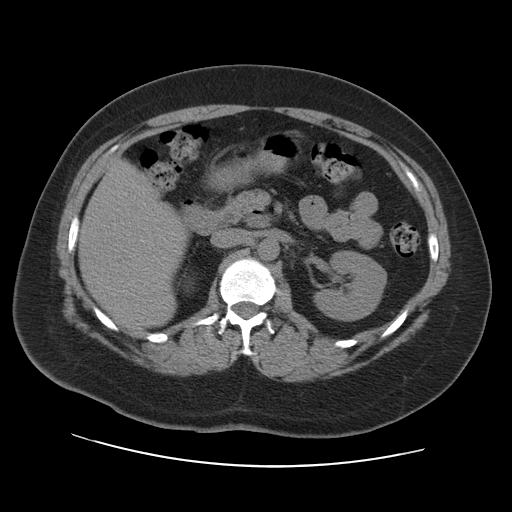
[im 61/70  soft-tissue]
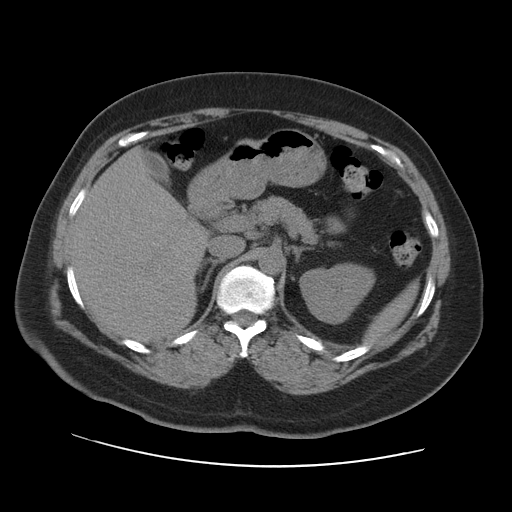
[im 61/70  lung]
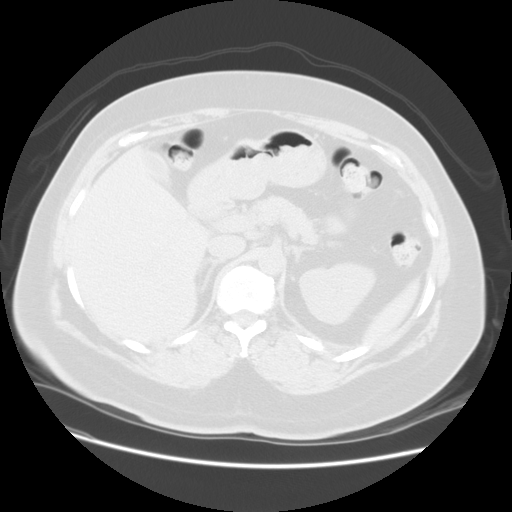
[im 63/70  lung]
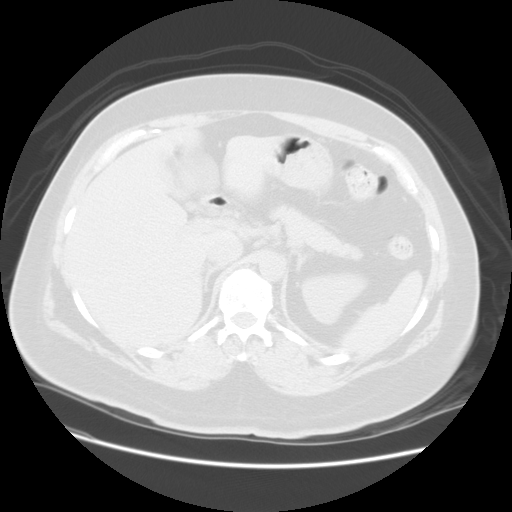
[im 65/70  soft-tissue]
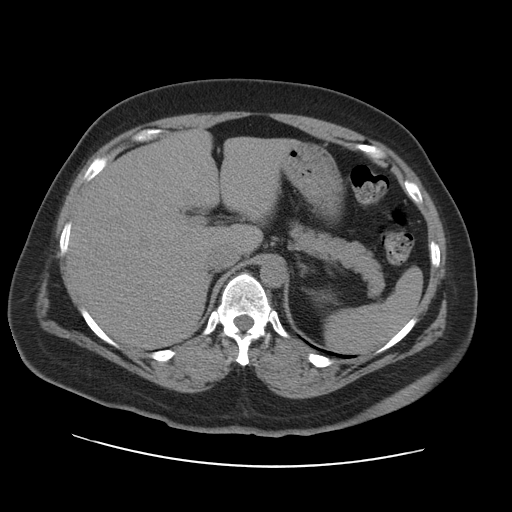
[im 65/70  lung]
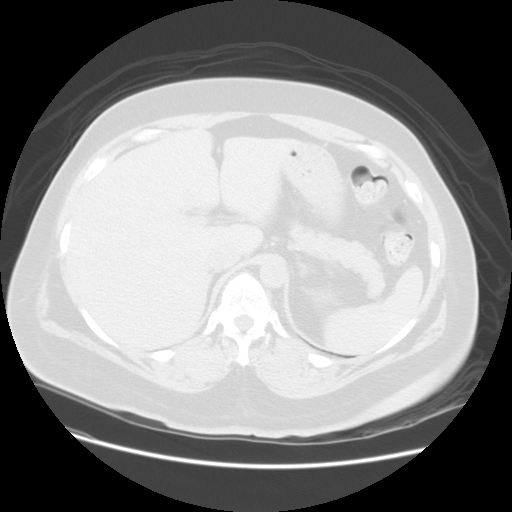
[im 67/70  lung]
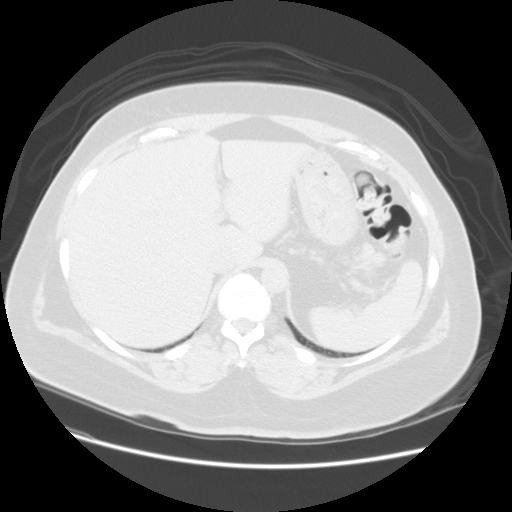

[15 of 32 positions shown; findings below may reference images not displayed]

FINDINGS: There is no evidence of renal calculi or hydronephrosis.  There is no evidence of perinephric fluid or acute inflammatory changes. 
 The other abdominal parenchymal organs are unremarkable in appearance on this non-contrast study.  No abnormal soft tissue masses are identified, and there is no evidence of inflammatory process or abnormal fluid collections. 
 Congenital malrotation right kidney is stable.  No change in likely benign 8 mm left iliac crest low density with sharp zone transition sclerotic margin is seen (image 45) with no progressive bone lesion noted.
IMPRESSION: 1.  Stable, likely benign focus superior left iliac bone. 
 2.  Otherwise negative. 
 PELVIS CT WITHOUT CONTRAST ? 08/01/05:
FINDINGS: There is no evidence of ureteral calculi or dilatation.  There is no evidence of pelvic mass.  No inflammatory process or abnormal fluid collections are identified.
IMPRESSION: Negative non-contrast pelvis CT.

## 2007-04-02 ENCOUNTER — Encounter: Admission: RE | Admit: 2007-04-02 | Discharge: 2007-04-02 | Payer: Self-pay | Admitting: Oncology

## 2007-09-18 IMAGING — MG MM SCREENING MAMMOGRAM LEFT
2 series · 2 of 2 positions shown · non-contrast
Comparison: none

DG SCREENING MAMMOGRAM LEFT
CC and MLO view(s) were taken of the left breast.

LEFT SCREENING MAMMOGRAM:
There is a fibroglandular pattern. Status post right mastectomy.  No masses or malignant type 
calcifications are identified.  Compared with prior studies.

[L CC]
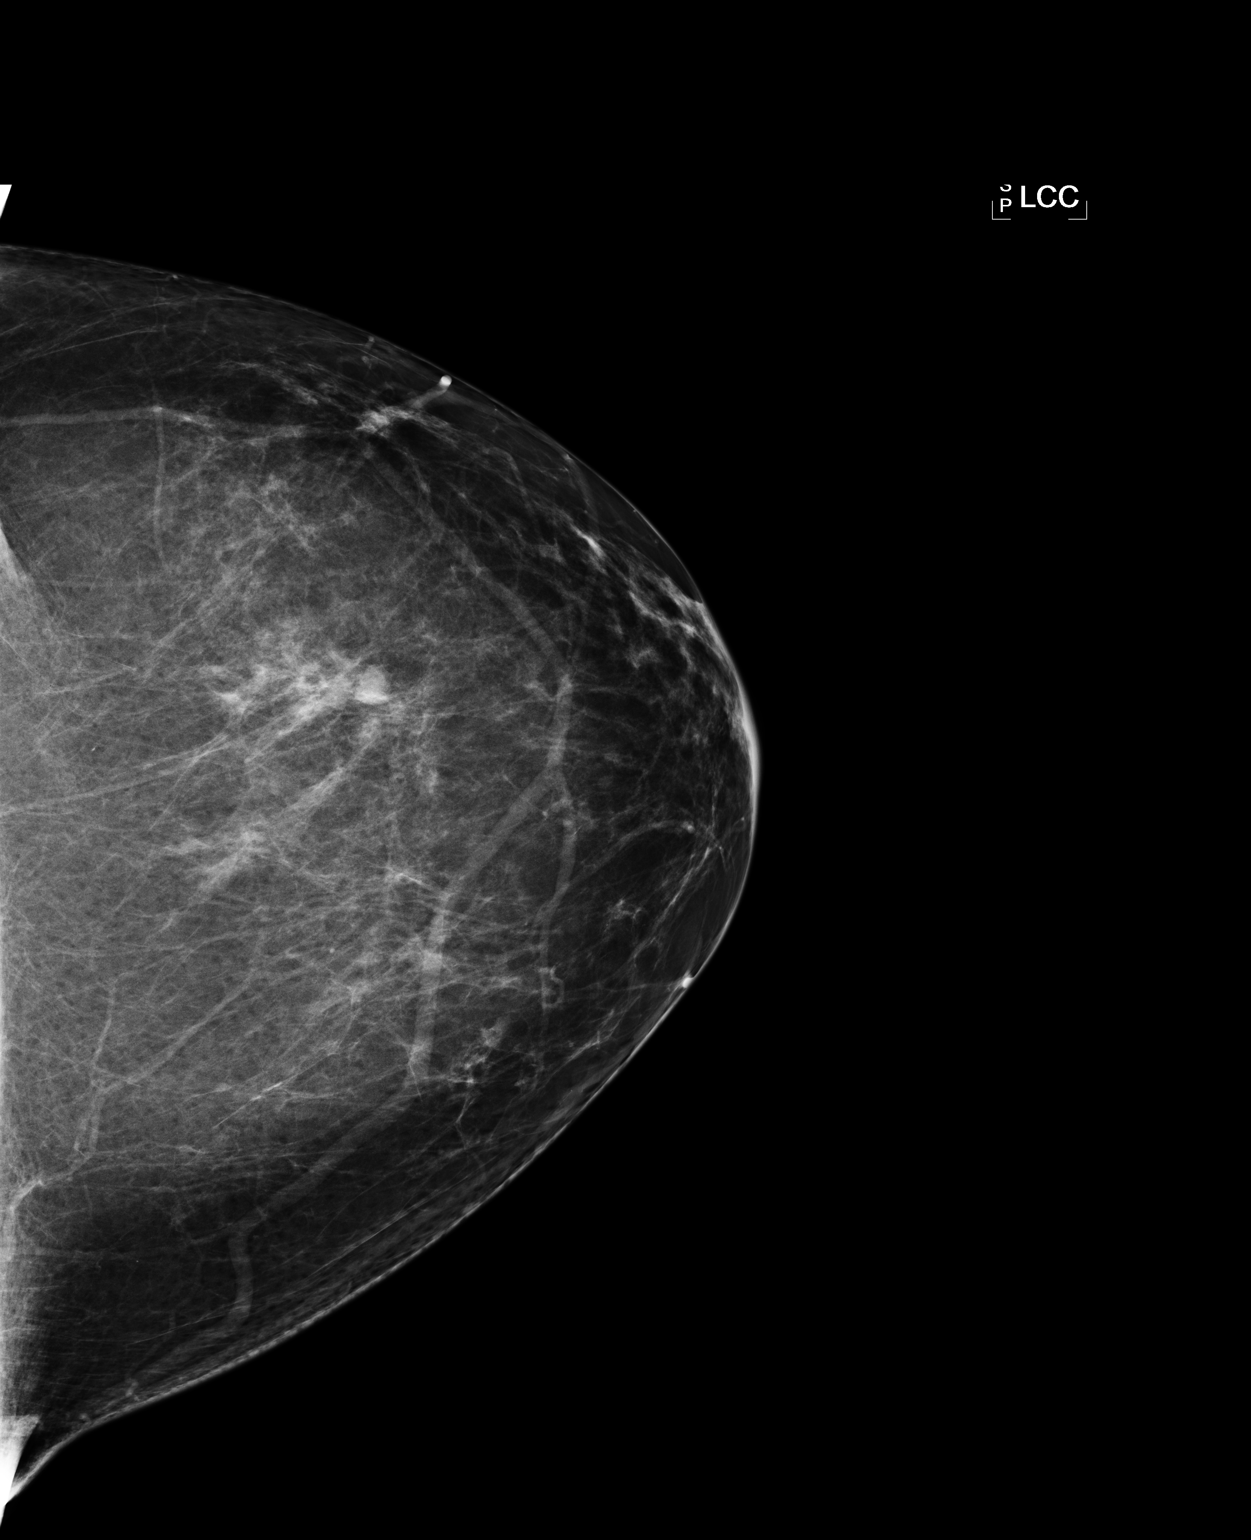

[L MLO]
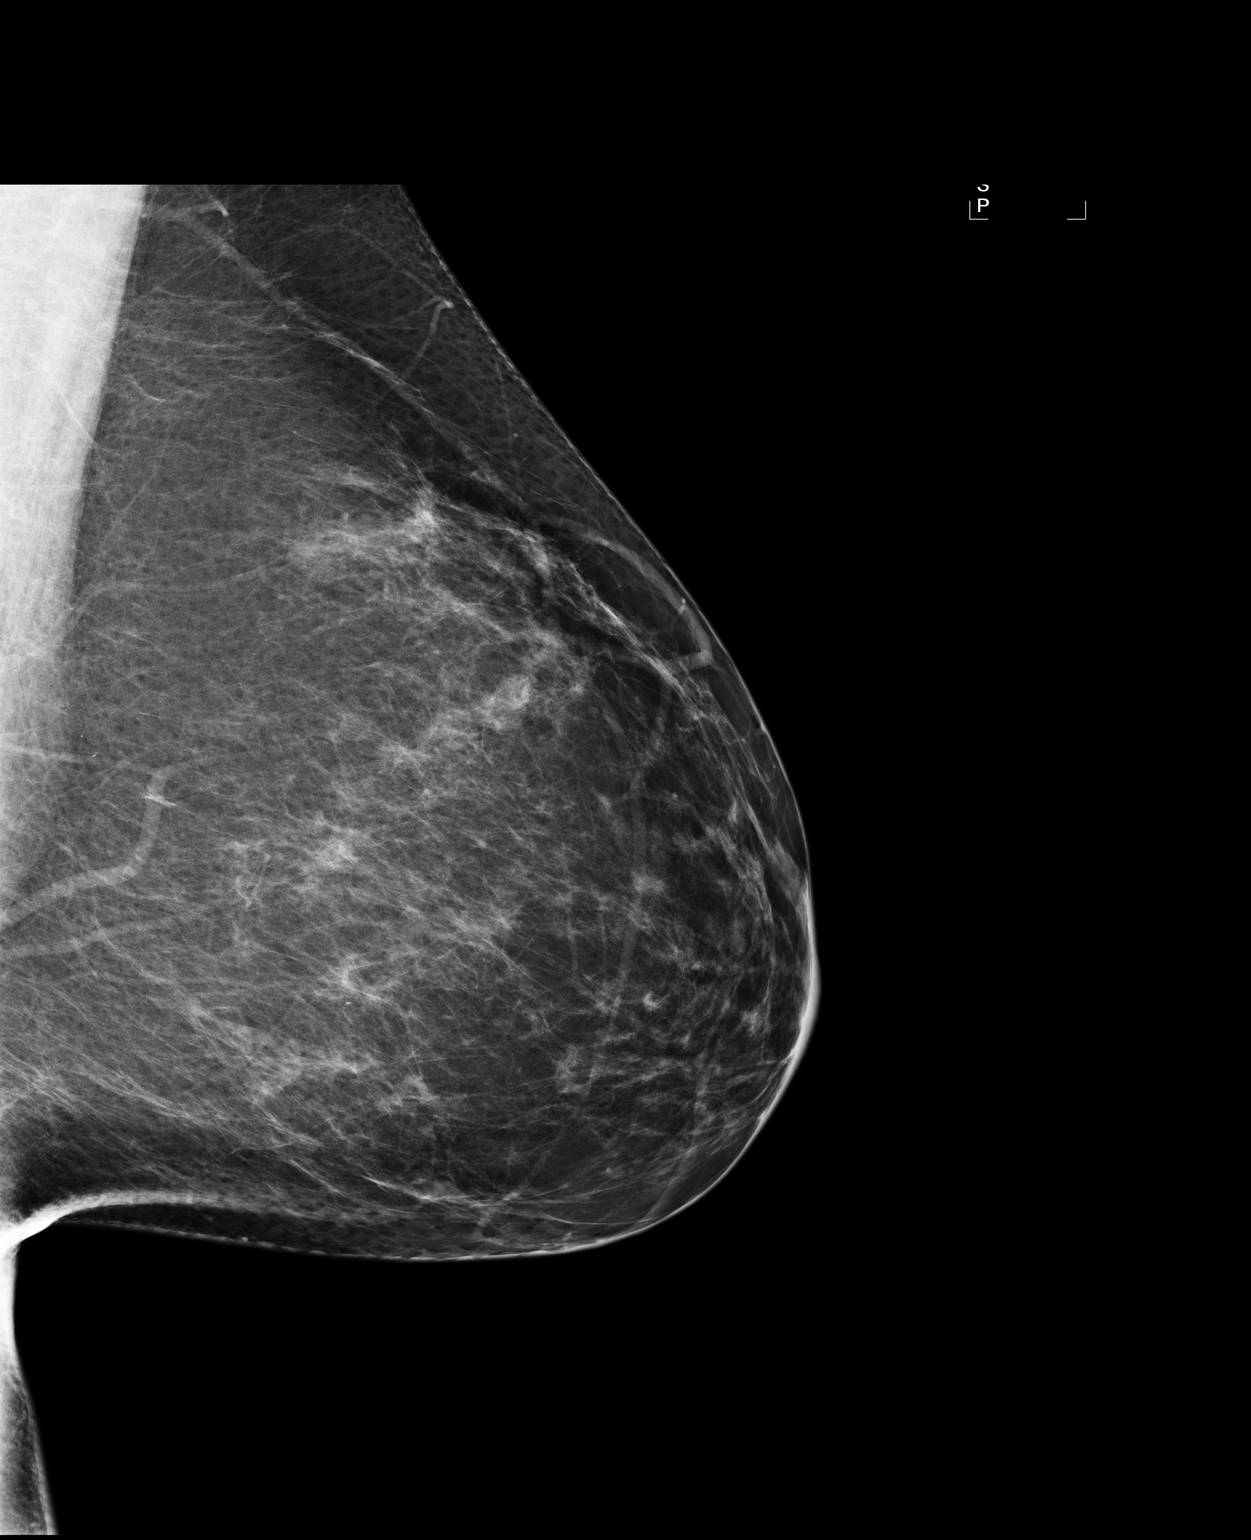

[2 of 2 positions shown; findings below may reference images not displayed]

IMPRESSION: No specific mammographic evidence of malignancy.  Next screening mammogram is recommended in one 
year.  Consider screening MRI every 1-2 years given the patient's high risk status for breast 
cancer.

ASSESSMENT: Negative - BI-RADS 1

Screening mammogram in 1 year.
ANALYZED BY COMPUTER AIDED DETECTION. , THIS PROCEDURE WAS A DIGITAL MAMMOGRAM.

## 2008-10-14 IMAGING — MG MM SCREENING MAMMOGRAM LEFT
2 series · 2 of 2 positions shown · non-contrast
Comparison: none

DG SCREENING MAMMOGRAM LEFT
CC and MLO view(s) were taken of the left breast.

DIGITAL LEFT SCREENING MAMMOGRAM WITH CAD:
There are scattered fibroglandular densities.  No masses or malignant type calcifications are 
identified.  Compared with prior studies.

[L CC]
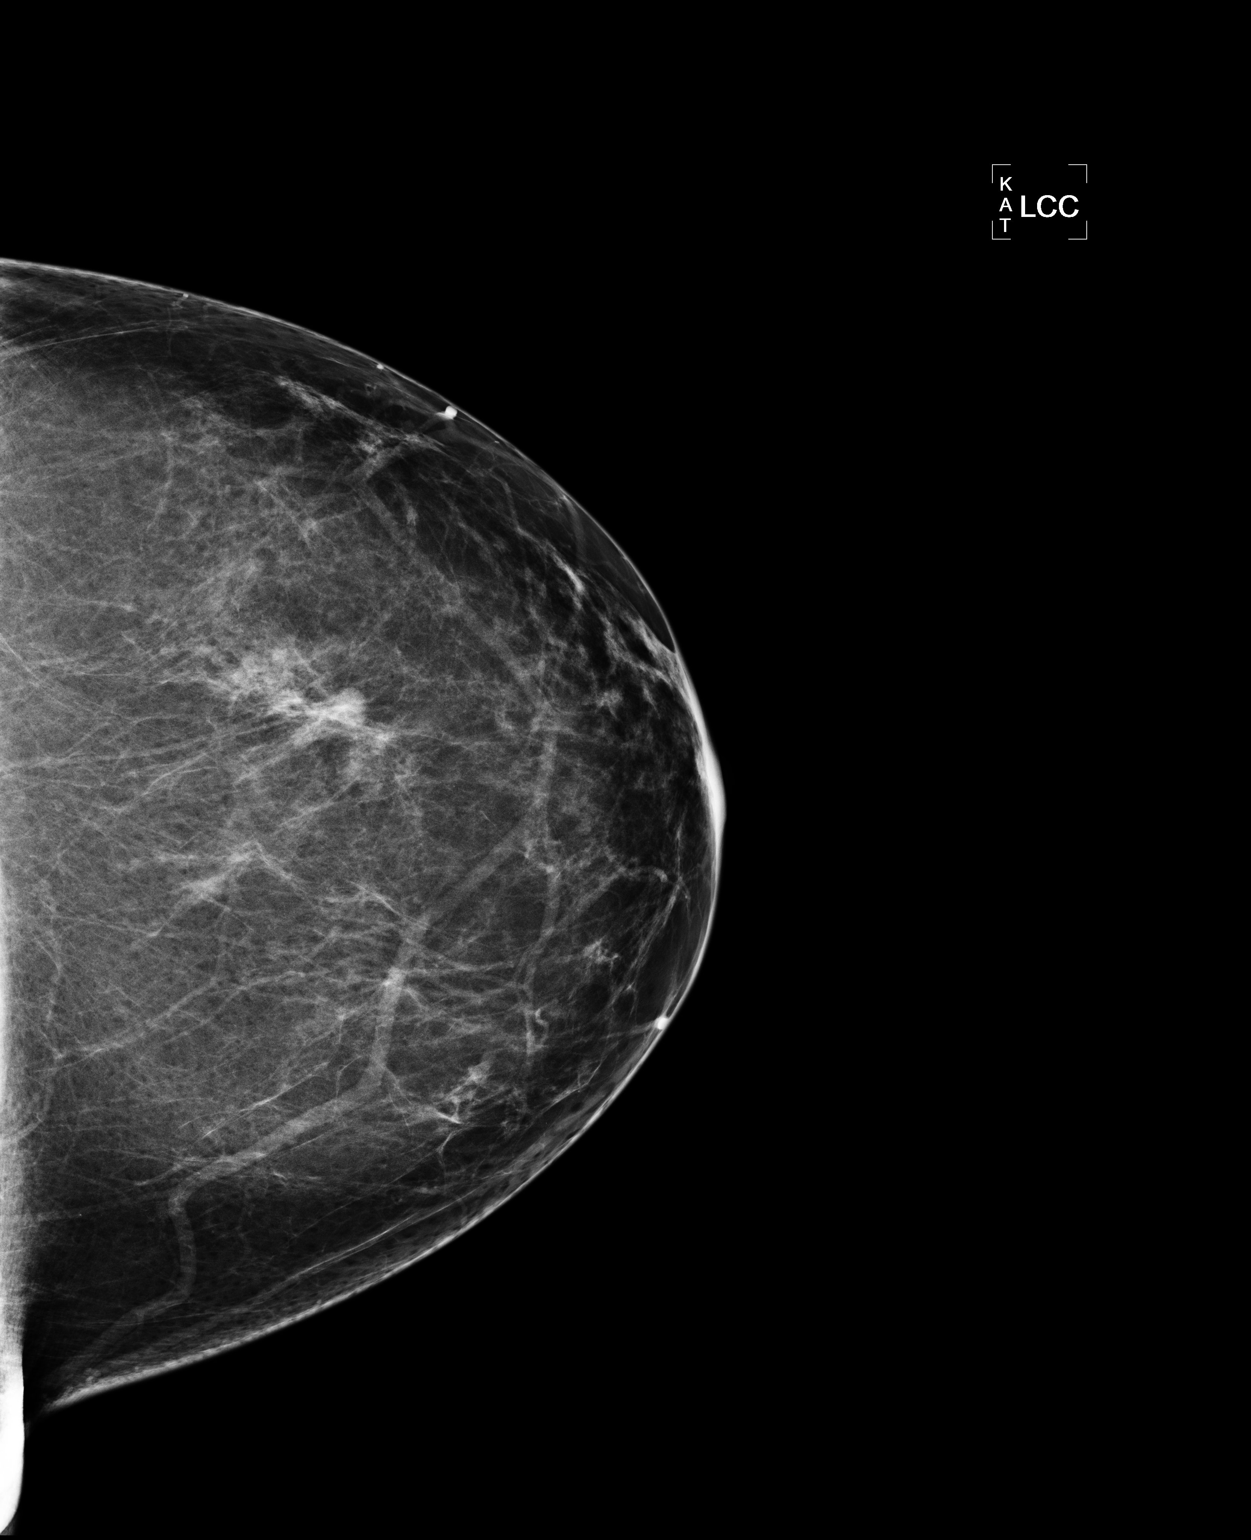

[L MLO]
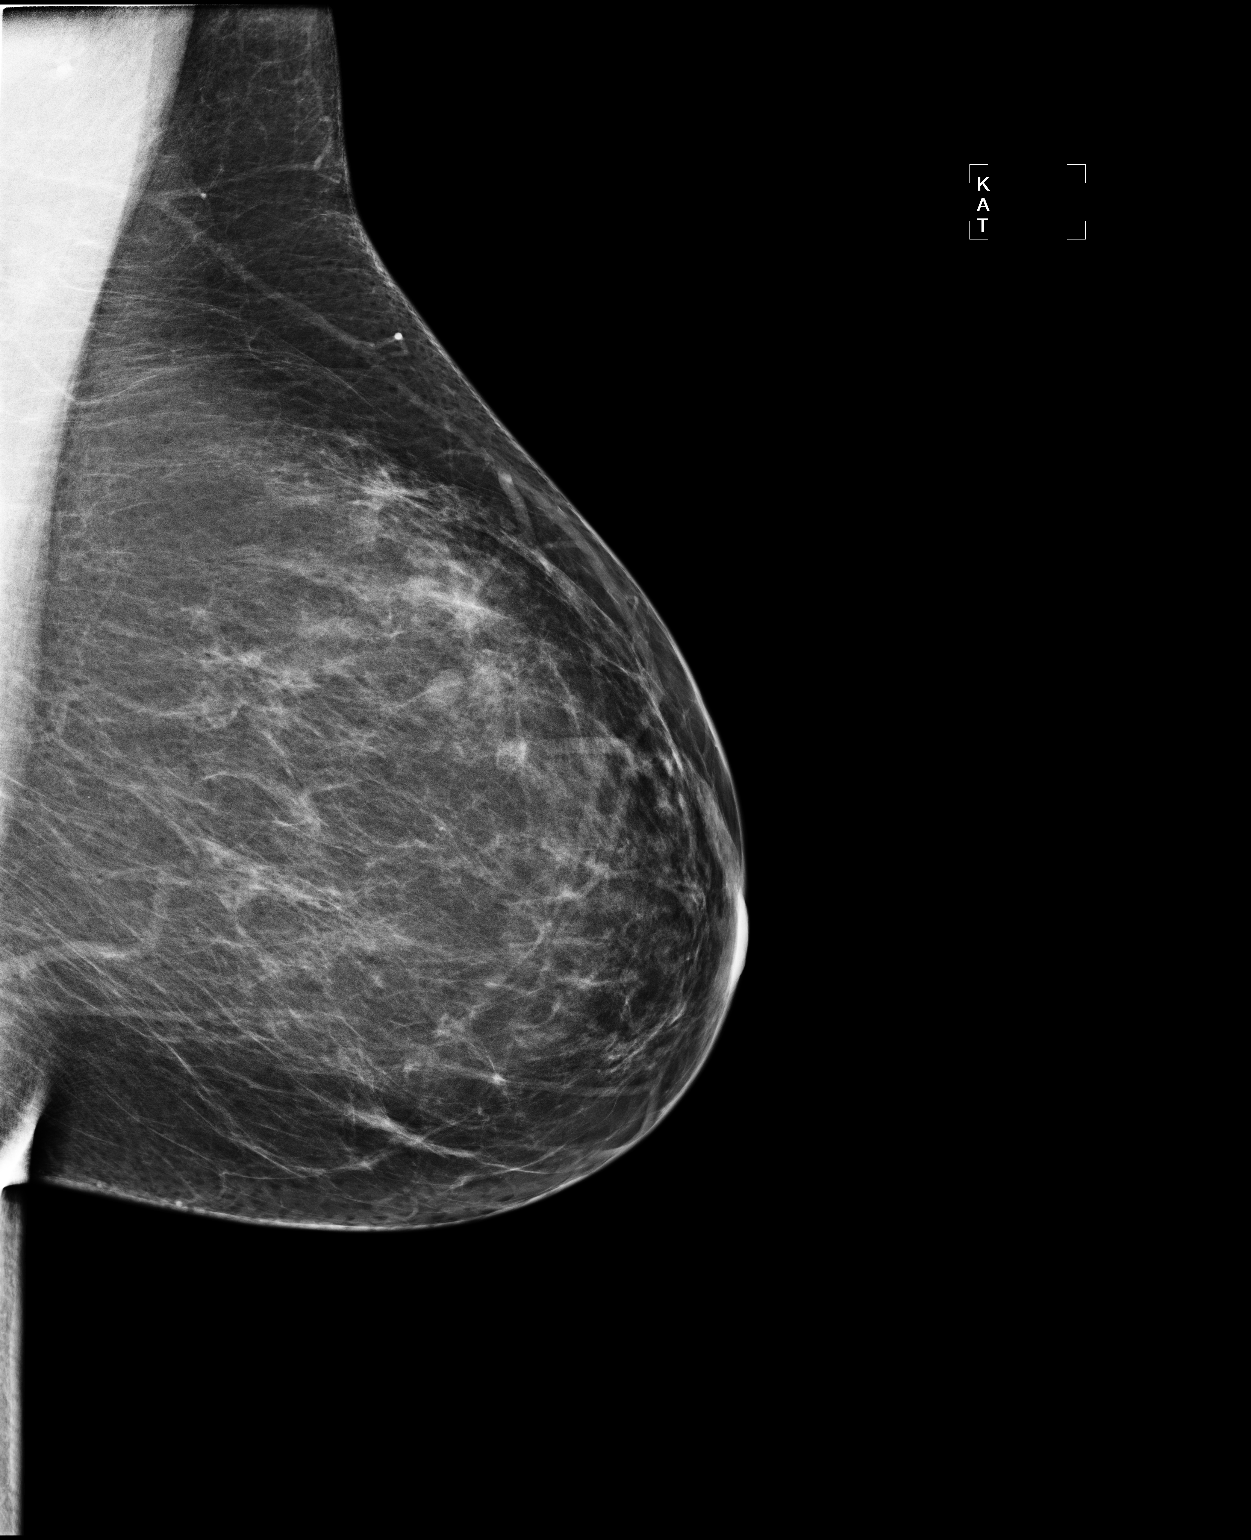

[2 of 2 positions shown; findings below may reference images not displayed]

IMPRESSION: No specific mammographic evidence of malignancy.  Next screening mammogram is recommended in one 
year.

ASSESSMENT: Negative - BI-RADS 1

Screening mammogram in 1 year.
ANALYZED BY COMPUTER AIDED DETECTION. , THIS PROCEDURE WAS A DIGITAL MAMMOGRAM.

## 2010-08-08 ENCOUNTER — Encounter
Admission: RE | Admit: 2010-08-08 | Discharge: 2010-08-08 | Payer: Self-pay | Source: Home / Self Care | Attending: Family Medicine | Admitting: Family Medicine

## 2010-08-09 ENCOUNTER — Encounter
Admission: RE | Admit: 2010-08-09 | Discharge: 2010-08-09 | Payer: Self-pay | Source: Home / Self Care | Attending: Family Medicine | Admitting: Family Medicine

## 2010-08-16 ENCOUNTER — Ambulatory Visit: Payer: Self-pay | Admitting: Oncology

## 2010-08-17 DIAGNOSIS — M1612 Unilateral primary osteoarthritis, left hip: Secondary | ICD-10-CM | POA: Insufficient documentation

## 2010-08-20 ENCOUNTER — Ambulatory Visit: Admit: 2010-08-20 | Payer: Self-pay | Admitting: Internal Medicine

## 2010-08-20 ENCOUNTER — Ambulatory Visit
Admission: RE | Admit: 2010-08-20 | Discharge: 2010-08-20 | Payer: Self-pay | Source: Home / Self Care | Attending: Internal Medicine | Admitting: Internal Medicine

## 2010-08-20 DIAGNOSIS — J45909 Unspecified asthma, uncomplicated: Secondary | ICD-10-CM | POA: Insufficient documentation

## 2010-08-20 DIAGNOSIS — J984 Other disorders of lung: Secondary | ICD-10-CM | POA: Insufficient documentation

## 2010-08-29 ENCOUNTER — Encounter: Payer: Self-pay | Admitting: Family Medicine

## 2010-08-30 NOTE — Assessment & Plan Note (Signed)
Summary: Pulmnary consultation/ chronic cough/ ? asthma, RLL SPN   Visit Type:  Initial Consult Copy to:  Dr. Shaune Pollack Primary Provider/Referring Provider:  Dr. Shaune Pollack  CC:  Cough and pulmonary nodule.  History of Present Illness: 60 yowf quit smoking 2010 with no resp symptoms at that cough but developed  intermittent  cough shortly thereafter and had it continuously since June 2011 with abn CT chest 08/09/10 therefore referred to pulmonary Clinic by Dr Shaune Pollack  August 20, 2010  1st pulmonary office eval recurrent episodes of cough since quit smoking but persistent since June 2011 cough is constant 24 hours per day to point of gagging, no vomiting and no excess mucus,  may be better after supper,better p ventolin,  assoc with occ sore throat, dysphagia,  but no itching, sneezing,  nasal congestion or excess secretions,  fever, chills, sweats, unintended wt loss, pleuritic or exertional cp, hempoptysis, change in activity tolerance  orthopnea pnd or leg swelling. Pt also denies any obvious fluctuation in symptoms with weather or environmental change or other alleviating or aggravating factors.       Allergies (verified): 1)  * Asa  Past History:  Past Medical History: OSTEOARTHRITIS, HIP, LEFT (ICD-715.95) BREAST CANCER, RIGHT (ICD-174.9)     - dx 2005 Stage 1 with Pos ER receptors, s/p chemo completed Feb 2006 and tamoxifen 2009 Asthma    - HFA 50% p coaching August 20, 2010  RLL Nodule       - CT chest 08/09/10 Post RLL x 11 mm with mildly enlarged med nodes      - PET scheduled for 08/22/10 > postponed   Past Surgical History: Back surgery 1996 Breast surgery 2005  Family History: Breast CA- Mother Heart dz- Father Asthma- Nephew  Social History: Former Smoker.  Quit in 2009.  1 ppd x 30 yrs [employer] Single No children  Review of Systems       The patient complains of shortness of breath with activity, non-productive cough, chest pain,  indigestion, difficulty swallowing, sore throat, hand/feet swelling, and joint stiffness or pain.  The patient denies shortness of breath at rest, productive cough, coughing up blood, irregular heartbeats, acid heartburn, loss of appetite, weight change, abdominal pain, tooth/dental problems, headaches, nasal congestion/difficulty breathing through nose, sneezing, itching, ear ache, anxiety, depression, rash, change in color of mucus, and fever.    Vital Signs:  Patient profile:   60 year old female Height:      65 inches Weight:      222.25 pounds BMI:     37.12 O2 Sat:      95 % on Room air Temp:     97.8 degrees F oral Pulse rate:   93 / minute BP sitting:   124 / 78  (left arm) Cuff size:   large  Vitals Entered By: Vernie Murders (August 20, 2010 12:10 PM)  O2 Flow:  Room air  Physical Exam  Additional Exam:  somber mod obese wf nad wt 222 August 20, 2010 HEENT: nl dentition, turbinates, and orophanx. Nl external ear canals without cough reflex NECK :  without JVD/Nodes/TM/ nl carotid upstrokes bilaterally LUNGS: no acc muscle use, clear to A and P bilaterally without cough on insp or exp maneuvers CV:  RRR  no s3 or murmur or increase in P2, no edema   ABD:  soft and nontender with nl excursion in the supine position. No bruits or organomegaly, bowel sounds nl MS:  warm without deformities,  calf tenderness, cyanosis or clubbing SKIN: warm and dry without lesions   NEURO:  alert, approp, no deficits     Impression & Recommendations:  Problem # 1:  ASTHMA (ICD-493.90) Cough may or may not be related to asthma or the abn ct but since she's convinced that ventolin helps worth trying dulera 100 2 puffs first thing  in am and 2 puffs again in pm about 12 hours later   I spent extra time with the patient today explaining optimal mdi  technique.  This improved from  25-50% p coaching  The most common causes of chronic cough in immunocompetent adults include: upper airway cough  syndrome (UACS), previously referred to as postnasal drip syndrome,  caused by variety of rhinosinus conditions; (2) asthma; (3) GERD; (4) chronic bronchitis from cigarette smoking or other inhaled environmental irritants; (5) nonasthmatic eosinophilic bronchitis; and (6) bronchiectasis. These conditions, singly or in combination, have accounted for up to 94% of the causes of chronic cough in prospective studies.   most pts with chronic cough reflux as a secondary event which can exac asthma and further coughing therefore rx reviewed. See instructions for specific recommendations   Problem # 2:  PULMONARY NODULE (ICD-518.89)  Barely above the radar screen for PET scanning and just because it wasn't detected in 2005 doesn't mean it wasn't there depending on the slices.  next step is PET then if strongly pos, T surgery eval, if neg, follow conservatively.  Discussed in detail all the  indications, usual  risks and alternatives  relative to the benefits with patient who agrees to proceed with f/u the cough first in a month, then regroup re limited ct at 3 months vs proceeding with PET as she has no medical insurance and the cost/benefit of early dx is not in favor of an aggressive surgical approach, even if this turns out to be a neoplasm.  Orders: Misc. Referral (Misc. Ref) New Patient Level IV (16109)  Patient Instructions: 1)  Try dulera 100 2 puffs first thing  in am and 2 puffs again in pm about 12 hours later  2)  Work on inhaler technique:  relax and blow all the way out then take a nice smooth deep breath back in, triggering the inhaler at same time you start breathing in  3)  Prilosec before bfast and pepcid 20 mg at bedtime as long as coughing ( reflux is to cough what oxygen is to fire)  4)  GERD (REFLUX)  is a common cause of respiratory symptoms. It commonly presents without heartburn and can be treated with medication, but also with lifestyle changes including avoidance of late meals,  excessive alcohol, smoking cessation, and avoid fatty foods, chocolate, peppermint, colas, red wine, and acidic juices such as orange juice. NO MINT OR MENTHOL PRODUCTS SO NO COUGH DROPS  5)  USE SUGARLESS CANDY INSTEAD (jolley ranchers)  6)  NO OIL BASED VITAMINS  7)  Please schedule a follow-up appointment in 4 weeks, sooner if needed and I will call in the meantime with recommendations re your nodule after I have a change to review all the scans 8)  late add: discussed findings re abn ct with pt, wants to wait and do f/u limited ct of chest

## 2010-10-30 ENCOUNTER — Encounter: Payer: Self-pay | Admitting: Oncology

## 2010-12-14 NOTE — Op Note (Signed)
[name], Barbara Parks                ACCOUNT NO.:  [id number]   MEDICAL RECORD NO.:  [id number]          PATIENT TYPE:  AMB   LOCATION:  DSC                          FACILITY:  [facility]   PHYSICIAN:  Rose Phi. Maple Hudson, M.D.   DATE OF BIRTH:  1951-06-07   DATE OF PROCEDURE:  10/23/2004  DATE OF DISCHARGE:                                 OPERATIVE REPORT   PREOPERATIVE DIAGNOSIS:  History of carcinoma of the breast.   POSTOPERATIVE DIAGNOSIS:  History of carcinoma of the breast.   OPERATION:  Removal of port-A-Cath.   SURGEON:  Rose Phi. Maple Hudson, M.D.   ANESTHESIA:  Local.   OPERATIVE PROCEDURE:  Patient placed on the operating table and the left  upper chest prepped and draped in the usual fashion.  After obtaining local  anesthesia with 1% Xylocaine with adrenaline, a transverse incision was made  and the port exposed.  I was able to grasp the catheter and remove it from  the vein and then lift up on it with some traction and identify the two  sutures holding it in place.  They were divided, and the catheter and the  port were removed.  There was no bleeding.  We then closed it with a  subcuticular 4-0 Monocryl and Steri-Strips.  Dressing applied.  The patient  was then allowed to go home.      PRY/MEDQ  D:  10/23/2004  T:  10/23/2004  Job:  [id number]

## 2010-12-14 NOTE — Op Note (Signed)
NAME:  Barbara Parks, Barbara Parks                          ACCOUNT NO.:  [id number]   MEDICAL RECORD NO.:  [id number]                   PATIENT TYPE:  AMB   LOCATION:  DSC                                  FACILITY:  [facility]   PHYSICIAN:  Rose Phi. Maple Hudson, M.D.                DATE OF BIRTH:  Sep 15, 1950   DATE OF PROCEDURE:  03/22/2004  DATE OF DISCHARGE:                                 OPERATIVE REPORT   PREOPERATIVE DIAGNOSIS:  Stage I carcinoma of the right breast.   POSTOPERATIVE DIAGNOSIS:  Stage I carcinoma of the right breast.   OPERATION:  Blue dye injection right total mastectomy with sentinel lymph  node biopsy.   SURGEON:  Dr. Francina Ames.   ASSISTANT:  Dr. Violeta Gelinas.   ANESTHESIA:  General.   OPERATIVE PROCEDURE:  Prior to coming to the operating room 1 mCi of  technetium sulfur colloid had been injected intradermally around the areola.   After suitable general anesthesia was induced, the patient was placed in the  supine position with the arms extended on the armboard.  Then 5 mL of a  mixture of 2 mL of methylene blue and 3 mL of injectable saline was injected  in the subareolar tissue and the breast gently massaged for 3 minutes.  We  then prepped and draped the breast and axilla.   Scanning the axilla there was clearly a hot spot and a short transverse  right axillary incision was made with dissection through the subcutaneous  tissue to the clavipectoral fascia.  Just deep to the fascia were two blue  and hot lymph nodes which we removed and a cold normal looking node adjacent  to it.  These were submitted to the pathologist for evaluation.   While that was being done, transverse elliptical incisions were outlined  above the mastectomy and incisions made and the flaps dissected in a  standard fashion using the cautery, going superiorly to near the clavicle  and medially to the sternum and inferiorly to the rectus fascia and  laterally to the latissimus dorsi muscle.  We  then removed the breast by  dissecting from medial to lateral, using the cautery, incorporating the  pectoralis fascia.  At the margin in the pectoralis major muscle we  retracted it and exposed a very low axilla which was excised.   At this time the pathologist informed us that the sentinel nodes were  negative for metastatic disease.  That then completed the procedure.  Two 19  Blake drains were inserted and brought out through separate stab wounds.  I  then closed both incisions with staples.  I then injected 30 mL of 0.25%  Marcaine into the mastectomy space and we are going to allow that to sit  there for 5 minutes before we charged the bulbs on the Simpson drains.   Dressings were applied and the patient transferred to recovery room in  satisfactory condition having tolerated the procedure well.                                               Rose Phi. Maple Hudson, M.D.    PRY/MEDQ  D:  03/22/2004  T:  03/22/2004  Job:  [id number]

## 2010-12-14 NOTE — Op Note (Signed)
NAMEWILLODEAN, LEVEN                ACCOUNT NO.:  [id number]   MEDICAL RECORD NO.:  [id number]          PATIENT TYPE:  AMB   LOCATION:  DSC                          FACILITY:  [facility]   PHYSICIAN:  Rose Phi. Maple Hudson, M.D.   DATE OF BIRTH:  27-Dec-1950   DATE OF PROCEDURE:  05/07/2004  DATE OF DISCHARGE:                                 OPERATIVE REPORT   PREOPERATIVE DIAGNOSIS:  Stage II carcinoma of the right breast.   POSTOPERATIVE DIAGNOSIS:  Stage II carcinoma of the right breast.   OPERATION:  1.  Right axillary lymph node dissection.  2.  Port-A-Cath insertion.   SURGEON:  Rose Phi. Maple Hudson, M.D.   ANESTHESIA:  General.   OPERATIVE PROCEDURE:  After suitable general endotracheal anesthesia was  induced, the patient was placed in a supine position with the arm extended  on the arm board.  The right chest and axilla were then prepped and draped  in usual fashion.  The old sentinel node incision was then incised and  extended medially and laterally and then we dissected down to where we could  identify the pectoralis major muscle.  With the proper exposure, we  dissected along the major muscle and exposed the minor, and then the  clavipectoral fascia overlying the vein.  We then dissected out all the  tissue inferior to the vein and from beneath the pectoralis minor muscle  with division of the cutaneous nerves and vessels and identification and  preservation of the long thoracic, and thoracodorsal nerves.  Following the  completion of this axillary dissection, we had good hemostasis.  This was a  level I and level II dissection.  A 19-French Blake drain was inserted and  brought out through a separate stab wound.  Subcutaneous tissue was closed  with 3-0 Vicryl and the skin was staples.   We then reprepped and re-draped the left upper chest with arms down by the  sides and a roll between the shoulders.   A left subclavian puncture was carried out without difficulty and the  guidewire inserted and proper positioning confirmed by fluoroscopy.  An  incision was made on the anterior chest wall and a pocket developed for the  implantable port.  I tunnel between the puncture site and the new pocket and  passed the catheter through there and connected to the [employer], which  was then anchored in the pocket with two 2-0 Prolene sutures.  The dilator  and peel-away sheath were then passed over the wire and the wire was removed  along with the dilator and the properly trimmed catheter to go to the 4th  interspace was passed through the peel-away sheath, and then it was removed.   Fluoroscopy was again used to confirm there was no kinking in the system and  that the catheter tip was in the superior vena cava.  I then aspirated and  fully heparinized the system.  The incision was closed with 3-0 Vicryl in  subcuticular 4-0 Monocryl and Steri-Strips.  Dressing was then applied and  the patient transferred to the recovery room in  satisfactory condition,  having tolerated the procedure well.      Pete   PRY/MEDQ  D:  05/07/2004  T:  05/08/2004  Job:  [id number]

## 2011-08-26 ENCOUNTER — Inpatient Hospital Stay (HOSPITAL_COMMUNITY)
Admission: EM | Admit: 2011-08-26 | Discharge: 2011-08-27 | DRG: 194 | Disposition: A | Discharge status: Home / Self Care | Payer: Self-pay | Attending: Internal Medicine | Admitting: Internal Medicine

## 2011-08-26 ENCOUNTER — Encounter (HOSPITAL_COMMUNITY): Payer: Self-pay

## 2011-08-26 ENCOUNTER — Emergency Department (HOSPITAL_COMMUNITY): Payer: Self-pay

## 2011-08-26 ENCOUNTER — Other Ambulatory Visit: Payer: Self-pay

## 2011-08-26 DIAGNOSIS — J984 Other disorders of lung: Secondary | ICD-10-CM

## 2011-08-26 DIAGNOSIS — Z79899 Other long term (current) drug therapy: Secondary | ICD-10-CM

## 2011-08-26 DIAGNOSIS — J45909 Unspecified asthma, uncomplicated: Secondary | ICD-10-CM

## 2011-08-26 DIAGNOSIS — K219 Gastro-esophageal reflux disease without esophagitis: Secondary | ICD-10-CM | POA: Diagnosis present

## 2011-08-26 DIAGNOSIS — J45901 Unspecified asthma with (acute) exacerbation: Secondary | ICD-10-CM | POA: Diagnosis present

## 2011-08-26 DIAGNOSIS — IMO0002 Reserved for concepts with insufficient information to code with codable children: Secondary | ICD-10-CM

## 2011-08-26 DIAGNOSIS — R0600 Dyspnea, unspecified: Secondary | ICD-10-CM

## 2011-08-26 DIAGNOSIS — J189 Pneumonia, unspecified organism: Principal | ICD-10-CM

## 2011-08-26 DIAGNOSIS — Z23 Encounter for immunization: Secondary | ICD-10-CM

## 2011-08-26 DIAGNOSIS — M1610 Unilateral primary osteoarthritis, unspecified hip: Secondary | ICD-10-CM

## 2011-08-26 DIAGNOSIS — M169 Osteoarthritis of hip, unspecified: Secondary | ICD-10-CM

## 2011-08-26 DIAGNOSIS — Z853 Personal history of malignant neoplasm of breast: Secondary | ICD-10-CM

## 2011-08-26 DIAGNOSIS — J159 Unspecified bacterial pneumonia: Secondary | ICD-10-CM

## 2011-08-26 LAB — BASIC METABOLIC PANEL
Calcium: 10.1 mg/dL (ref 8.4–10.5)
Creatinine, Ser: 0.77 mg/dL (ref 0.50–1.10)
GFR calc non Af Amer: 89 mL/min — ABNORMAL LOW (ref >90)
Sodium: 136 mEq/L (ref 135–145)

## 2011-08-26 LAB — CBC
HCT: 40.3 % (ref 36.0–46.0)
MCH: 29.3 pg (ref 26.0–34.0)
MCHC: 32.8 g/dL (ref 30.0–36.0)
MCV: 89.4 fL (ref 78.0–100.0)
Platelets: 281 10*3/uL (ref 150–400)
RDW: 13.6 % (ref 11.5–15.5)
WBC: 12.2 10*3/uL — ABNORMAL HIGH (ref 4.0–10.5)

## 2011-08-26 LAB — DIFFERENTIAL
Basophils Absolute: 0.1 10*3/uL (ref 0.0–0.1)
Basophils Relative: 0 % (ref 0–1)
Eosinophils Absolute: 0 10*3/uL (ref 0.0–0.7)
Eosinophils Relative: 0 % (ref 0–5)
Monocytes Absolute: 1.1 10*3/uL — ABNORMAL HIGH (ref 0.1–1.0)

## 2011-08-26 LAB — PRO B NATRIURETIC PEPTIDE: Pro B Natriuretic peptide (BNP): 74 pg/mL (ref 0–125)

## 2011-08-26 LAB — D-DIMER, QUANTITATIVE: D-Dimer, Quant: 2.3 ug/mL-FEU — ABNORMAL HIGH (ref 0.00–0.48)

## 2011-08-26 MED ORDER — ACETAMINOPHEN 650 MG RE SUPP: 650.0000 mg | Freq: Four times a day (QID) | RECTAL | Status: DC | PRN

## 2011-08-26 MED ORDER — BIOTENE DRY MOUTH MT LIQD
15.0000 mL | Freq: Two times a day (BID) | OROMUCOSAL | Status: DC
  Administered 2011-08-26 – 2011-08-27 (×2): 15 mL via OROMUCOSAL

## 2011-08-26 MED ORDER — ALBUTEROL SULFATE HFA 108 (90 BASE) MCG/ACT IN AERS: 2.0000 | INHALATION_SPRAY | Freq: Four times a day (QID) | RESPIRATORY_TRACT | Status: DC | PRN

## 2011-08-26 MED ORDER — HEPARIN SODIUM (PORCINE) 5000 UNIT/ML IJ SOLN
5000.0000 [IU] | Freq: Three times a day (TID) | INTRAMUSCULAR | Status: DC
  Administered 2011-08-26 – 2011-08-27 (×2): 5000 [IU] via SUBCUTANEOUS
  Filled 2011-08-26 (×7): qty 1

## 2011-08-26 MED ORDER — DEXTROSE 5 % IV SOLN
1.0000 g | Freq: Once | INTRAVENOUS | Status: AC
  Administered 2011-08-26: 1 g via INTRAVENOUS
  Filled 2011-08-26 (×3): qty 10

## 2011-08-26 MED ORDER — SODIUM CHLORIDE 0.9 % IV SOLN: INTRAVENOUS | Status: DC

## 2011-08-26 MED ORDER — ONDANSETRON HCL 4 MG/2ML IJ SOLN: 4.0000 mg | Freq: Three times a day (TID) | INTRAMUSCULAR | Status: AC | PRN

## 2011-08-26 MED ORDER — ALBUTEROL SULFATE (5 MG/ML) 0.5% IN NEBU
2.5000 mg | INHALATION_SOLUTION | RESPIRATORY_TRACT | Status: AC | PRN
  Administered 2011-08-27: 2.5 mg via RESPIRATORY_TRACT
  Filled 2011-08-26: qty 0.5

## 2011-08-26 MED ORDER — GUAIFENESIN ER 600 MG PO TB12
1200.0000 mg | ORAL_TABLET | Freq: Two times a day (BID) | ORAL | Status: DC
  Administered 2011-08-26 – 2011-08-27 (×2): 1200 mg via ORAL
  Filled 2011-08-26 (×2): qty 2

## 2011-08-26 MED ORDER — DEXTROSE 5 % IV SOLN
1.0000 g | INTRAVENOUS | Status: DC
  Filled 2011-08-26: qty 10

## 2011-08-26 MED ORDER — ACETAMINOPHEN 325 MG PO TABS
650.0000 mg | ORAL_TABLET | Freq: Four times a day (QID) | ORAL | Status: DC | PRN
  Administered 2011-08-26: 650 mg via ORAL
  Filled 2011-08-26: qty 2

## 2011-08-26 MED ORDER — PREDNISONE 20 MG PO TABS
40.0000 mg | ORAL_TABLET | Freq: Every day | ORAL | Status: DC
  Administered 2011-08-26 – 2011-08-27 (×2): 40 mg via ORAL
  Filled 2011-08-26 (×2): qty 2

## 2011-08-26 MED ORDER — ALBUTEROL SULFATE (5 MG/ML) 0.5% IN NEBU: 2.5000 mg | INHALATION_SOLUTION | RESPIRATORY_TRACT | Status: DC | PRN

## 2011-08-26 MED ORDER — AZITHROMYCIN 500 MG IV SOLR
500.0000 mg | INTRAVENOUS | Status: DC
  Administered 2011-08-26: 500 mg via INTRAVENOUS
  Filled 2011-08-26 (×2): qty 500

## 2011-08-26 MED ORDER — ALBUTEROL SULFATE (5 MG/ML) 0.5% IN NEBU
2.5000 mg | INHALATION_SOLUTION | Freq: Once | RESPIRATORY_TRACT | Status: AC
  Administered 2011-08-26: 2.5 mg via RESPIRATORY_TRACT
  Filled 2011-08-26: qty 0.5

## 2011-08-26 MED ORDER — INFLUENZA VIRUS VACC SPLIT PF IM SUSP
0.5000 mL | INTRAMUSCULAR | Status: DC
Start: 2011-08-27 — End: 2011-08-27
  Filled 2011-08-26: qty 0.5

## 2011-08-26 MED ORDER — BENZONATATE 100 MG PO CAPS
100.0000 mg | ORAL_CAPSULE | Freq: Three times a day (TID) | ORAL | Status: DC | PRN
  Administered 2011-08-27: 100 mg via ORAL
  Filled 2011-08-26: qty 1

## 2011-08-26 MED ORDER — IPRATROPIUM BROMIDE 0.02 % IN SOLN
0.5000 mg | Freq: Once | RESPIRATORY_TRACT | Status: AC
  Administered 2011-08-26: 0.5 mg via RESPIRATORY_TRACT
  Filled 2011-08-26: qty 2.5

## 2011-08-26 NOTE — ED Notes (Signed)
Complain of pressure in chest and sob since last night

## 2011-08-26 NOTE — H&P (Signed)
Barbara Parks MRN: [id number] DOB/AGE: 29-Jan-1951 61 y.o. Primary Care Physician:GATES,DONNA RUTH, MD, MD Admit date: 08/26/2011 Chief Complaint: Cough, wheezing. HPI: This very pleasant 61 year old lady presents with the above symptoms for the last 3-4 days. She is known to have asthma. She quit smoking several years ago. She has had no fever with these symptoms. The cough is largely nonproductive. She came to the emergency room and after series of nebulizers she feels somewhat better but not quite her normal self. She normally only takes a albuterol inhaler as needed.    Past medical history: 1. Asthma. 2. History of breast cancer status post breast surgery. 3. Gastroesophageal reflux disease.      Family history: Noncontributory.  Social history: She has never been married. She lives alone. She quit smoking several years ago. She does not drink alcohol. 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  Allergies:  Allergies  Allergen Reactions  . Aspirin     REACTION: upset stomach  . Latex Other (See Comments)    Blistering and skin peels off  . Nsaids Nausea And Vomiting    Extreme nausea and vomiting    Medications Prior to Admission  Medication Dose Route Frequency Provider Last Rate Last Dose  . acetaminophen (TYLENOL) tablet 650 mg  650 mg Oral Q6H PRN Nimish Normajean Glasgow, MD       Or  . acetaminophen (TYLENOL) suppository 650 mg  650 mg Rectal Q6H PRN Nimish C Gosrani, MD      . albuterol (PROVENTIL HFA;VENTOLIN HFA) 108 (90 BASE) MCG/ACT inhaler 2 puff  2 puff Inhalation Q6H PRN Nimish C Gosrani, MD      . albuterol (PROVENTIL) (5 MG/ML) 0.5% nebulizer solution 2.5 mg  2.5 mg Nebulization Once Dione Booze, MD   2.5 mg at 08/26/11 1331  . albuterol (PROVENTIL) (5 MG/ML) 0.5% nebulizer solution 2.5 mg  2.5 mg Nebulization Q2H PRN Nimish C Gosrani, MD      . azithromycin (ZITHROMAX) 500 mg in dextrose 5 % 250 mL IVPB  500 mg Intravenous Q24H Dione Booze, MD 250 mL/hr at  08/26/11 1657 500 mg at 08/26/11 1657  . cefTRIAXone (ROCEPHIN) 1 g in dextrose 5 % 50 mL IVPB  1 g Intravenous Once Dione Booze, MD   1 g at 08/26/11 1657  . cefTRIAXone (ROCEPHIN) 1 g in dextrose 5 % 50 mL IVPB  1 g Intravenous Q24H Nimish C Gosrani, MD      . heparin injection 5,000 Units  5,000 Units Subcutaneous Q8H Nimish C Gosrani, MD      . ipratropium (ATROVENT) nebulizer solution 0.5 mg  0.5 mg Nebulization Once Dione Booze, MD   0.5 mg at 08/26/11 1332  . predniSONE (DELTASONE) tablet 40 mg  40 mg Oral Q breakfast Nimish Normajean Glasgow, MD       No current outpatient prescriptions on file as of 08/26/2011.       YQM:VHQIO from the symptoms mentioned above,there are no other symptoms referable to all systems reviewed.  Physical Exam: Blood pressure 129/66, pulse 88, temperature 98.1 F (36.7 C), temperature source Oral, resp. rate 22, height 5\' 5"  (1.651 m), weight 92.987 kg (205 lb), SpO2 100.00%. She looks systemically well. There is no increased work of breathing. There is no peripheral or central cyanosis. Lung fields are relatively clear with a few scattered areas of wheezing. There is no crackles or bronchial breathing. Heart sounds are present and normal without murmurs. She does not have resting tachycardia. Abdomen  is soft and nontender with no evidence of hepatosplenomegaly. Neurologically,, she is alert and orientated without any focal signs.    Basename 08/26/11 1333  WBC 12.2*  NEUTROABS 9.9*  HGB 13.2  HCT 40.3  MCV 89.4  PLT 281    Basename 08/26/11 1333  NA 136  K 4.1  CL 101  CO2 23  GLUCOSE 101*  BUN 12  CREATININE 0.77  CALCIUM 10.1  MG --         Dg Chest 2 View  08/26/2011  *RADIOLOGY REPORT*  Clinical Data: 61 year old female with cough and shortness of breath.  CHEST - 2 VIEW  Comparison: 08/08/2010  Findings: New airspace disease in the right lower lung is identified likely representing pneumonia. Upper limits normal heart size again  identified. There may be a tiny right pleural effusion present. There is no evidence of pneumothorax or left pleural effusion. Right mastectomy and axillary surgery again noted.  IMPRESSION: New right lower lung airspace disease likely representing pneumonia.  Radiographic follow up to resolution is recommended to exclude other processes. Question tiny right pleural effusion.  Original Report Authenticated By: Rosendo Gros, M.D.   Impression: 1. Right lower lobe pneumonia. 2. Asthma exacerbation.     Plan: 1. Admit to regular medical floor. 2. Intravenous antibiotics. 3. Oral steroids. If she remains stable, consider discharge home tomorrow.      Wilson Singer Pager [phone]  08/26/2011, 5:20 PM

## 2011-08-26 NOTE — ED Notes (Signed)
Pt eating meal tray, awaiting bed assignment

## 2011-08-26 NOTE — ED Notes (Signed)
Pt resting in bed, voices no complaints

## 2011-08-26 NOTE — ED Notes (Signed)
Peanut butter crackers and water given to pt

## 2011-08-26 NOTE — ED Notes (Signed)
Breathing treatment completed, pt tolerated well

## 2011-08-26 NOTE — ED Notes (Signed)
Stated she is feeling much better, with o2 in place

## 2011-08-26 NOTE — ED Notes (Signed)
Pt brought to er for eval of cp/chest pressure, that started today.  Also sob, hob elevated.  o2 in place at 2l by .  resp even and unlabored.  Rates pain at 7/10 on scale.  Dr.glick to see pt.

## 2011-08-26 NOTE — ED Notes (Signed)
Pt returned from xray, stated she became winded after moving over to table in xray, o2 placed at 2l, sats 96%, vss.  Hob elevated.

## 2011-08-26 NOTE — ED Notes (Signed)
Entered room to transport pt to floor, c/o iv to left ac hurting, site d/c and restarted. To left wrist

## 2011-08-26 NOTE — ED Provider Notes (Signed)
History    Scribed for Barbara Booze, MD, the patient was seen in room APA04/APA04. This chart was scribed by [name].   CSN: [id number]  Arrival date & time 08/26/11  1243   First MD Initiated Contact with Patient 08/26/11 1309      No chief complaint on file.   (Consider location/radiation/quality/duration/timing/severity/associated sxs/prior treatment) Patient is a 61 y.o. female presenting with chest pain and shortness of breath. The history is provided by the patient. No language interpreter was used.  Chest Pain Episode onset: about 3 days ago. Chest pain occurs constantly. The pain is associated with breathing. At its most intense, the pain is at 8/10. The pain is currently at 8/10. The quality of the pain is described as pressure-like. Exacerbated by: lying flat. Primary symptoms include shortness of breath and cough. Pertinent negatives for primary symptoms include no fever, no nausea and no vomiting.  Pertinent negatives for associated symptoms include no diaphoresis. Associated symptoms comments: No diarrhea. Past medical history comments: asthma    Shortness of Breath  The problem has been gradually worsening. Exacerbated by: extertion  Associated symptoms include chest pain, cough and shortness of breath. Pertinent negatives include no fever. Past medical history comments: asthma.    Patient reports pain is somewhat like asthma episodes.  Shortness of breath is somewhat better Patient also with productive cough with clear sputum.  Patient states she experiences nausea with excessive coughing.  Patient used inhaler without relief.  Patient ran out of inhaler medication.  Patient also noticed swelling in her legs yesterday.    PCP [name], MD, MD   Past Medical History  Diagnosis Date  . Asthma     Past Surgical History  Procedure Date  . Breast surgery     No family history on file.  History  Substance Use Topics  . Smoking status: Former Games developer    . Smokeless tobacco: Not on file  . Alcohol Use: No    OB History    Grav Para Term Preterm Abortions TAB SAB Ect Mult Living                  Review of Systems  Constitutional: Negative for fever and diaphoresis.  Respiratory: Positive for cough and shortness of breath.   Cardiovascular: Positive for chest pain.  Gastrointestinal: Negative for nausea, vomiting and diarrhea.  All other systems reviewed and are negative.    Allergies  Aspirin; Latex; and Nsaids  Home Medications   Current Outpatient Rx  Name Route Sig Dispense Refill  . ALBUTEROL SULFATE HFA 108 (90 BASE) MCG/ACT IN AERS Inhalation Inhale 2 puffs into the lungs every 6 (six) hours as needed. For shortness of breath      BP 121/70  Pulse 98  Temp(Src) 98.1 F (36.7 C) (Oral)  Resp 22  Ht 5\' 5"  (1.651 m)  Wt 205 lb (92.987 kg)  BMI 34.11 kg/m2  SpO2 96%  Physical Exam  Nursing note and vitals reviewed. Constitutional: She is oriented to person, place, and time. She appears well-developed.  HENT:  Head: Normocephalic.  Eyes: Conjunctivae and EOM are normal.  Neck: Neck supple.  Cardiovascular: Normal rate, regular rhythm and normal heart sounds.   No murmur heard. Pulmonary/Chest: No respiratory distress.       Prolonged exhalation phase, slight wheeze with forced exhalation    Abdominal: Soft. There is no tenderness.  Musculoskeletal: Normal range of motion. She exhibits no edema.       1+ pitting  edema in bilateral lower extremities   Neurological: She is alert and oriented to person, place, and time.  Skin: Skin is warm and dry. No rash noted.  Psychiatric: She has a normal mood and affect. Her behavior is normal.    ED Course  Procedures (including critical care time)   DIAGNOSTIC STUDIES: Oxygen Saturation is 96% on room air, normal by my interpretation.    EKG  Date: 08/26/2011  Rate: 103  Rhythm: sinus tachycardia  QRS Axis: normal  Intervals: normal  ST/T Wave  abnormalities: normal  Conduction Disutrbances:none  Narrative Interpretation: Low QRS voltage ECG. When compared with ECG of 03/20/2004, QRS voltage has decreased.  Old EKG Reviewed: changes noted   COORDINATION OF CARE: 1:16 PM  Physical exam complete.  Will order CXR, labs, cardiac markers, and breathing treatment.   1:30 PM  Proventil and Atrovent.     LABS / RADIOLOGY:   Labs Reviewed  CBC - Abnormal; Notable for the following:    WBC 12.2 (*)    All other components within normal limits  DIFFERENTIAL - Abnormal; Notable for the following:    Neutrophils Relative 81 (*)    Neutro Abs 9.9 (*)    Lymphocytes Relative 9 (*)    Monocytes Absolute 1.1 (*)    All other components within normal limits  BASIC METABOLIC PANEL - Abnormal; Notable for the following:    Glucose, Bld 101 (*)    GFR calc non Af Amer 89 (*)    All other components within normal limits  D-DIMER, QUANTITATIVE - Abnormal; Notable for the following:    D-Dimer, Quant 2.30 (*)    All other components within normal limits  PRO B NATRIURETIC PEPTIDE   Results for orders placed during the hospital encounter of 08/26/11  CBC      Component Value Range   WBC 12.2 (*) 4.0 - 10.5 (K/uL)   RBC 4.51  3.87 - 5.11 (MIL/uL)   Hemoglobin 13.2  12.0 - 15.0 (g/dL)   HCT 16.1  09.6 - 04.5 (%)   MCV 89.4  78.0 - 100.0 (fL)   MCH 29.3  26.0 - 34.0 (pg)   MCHC 32.8  30.0 - 36.0 (g/dL)   RDW 40.9  81.1 - 91.4 (%)   Platelets 281  150 - 400 (K/uL)  DIFFERENTIAL      Component Value Range   Neutrophils Relative 81 (*) 43 - 77 (%)   Neutro Abs 9.9 (*) 1.7 - 7.7 (K/uL)   Lymphocytes Relative 9 (*) 12 - 46 (%)   Lymphs Abs 1.1  0.7 - 4.0 (K/uL)   Monocytes Relative 9  3 - 12 (%)   Monocytes Absolute 1.1 (*) 0.1 - 1.0 (K/uL)   Eosinophils Relative 0  0 - 5 (%)   Eosinophils Absolute 0.0  0.0 - 0.7 (K/uL)   Basophils Relative 0  0 - 1 (%)   Basophils Absolute 0.1  0.0 - 0.1 (K/uL)  BASIC METABOLIC PANEL      Component  Value Range   Sodium 136  135 - 145 (mEq/L)   Potassium 4.1  3.5 - 5.1 (mEq/L)   Chloride 101  96 - 112 (mEq/L)   CO2 23  19 - 32 (mEq/L)   Glucose, Bld 101 (*) 70 - 99 (mg/dL)   BUN 12  6 - 23 (mg/dL)   Creatinine, Ser 7.82  0.50 - 1.10 (mg/dL)   Calcium 95.6  8.4 - 10.5 (mg/dL)   GFR calc non Af  Amer 89 (*) >90 (mL/min)   GFR calc Af Amer >90  >90 (mL/min)  PRO B NATRIURETIC PEPTIDE      Component Value Range   Pro B Natriuretic peptide (BNP) 74.0  0 - 125 (pg/mL)  D-DIMER, QUANTITATIVE      Component Value Range   D-Dimer, Quant 2.30 (*) 0.00 - 0.48 (ug/mL-FEU)    Dg Chest 2 View  08/26/2011  *RADIOLOGY REPORT*  Clinical Data: 61 year old female with cough and shortness of breath.  CHEST - 2 VIEW  Comparison: 08/08/2010  Findings: New airspace disease in the right lower lung is identified likely representing pneumonia. Upper limits normal heart size again identified. There may be a tiny right pleural effusion present. There is no evidence of pneumothorax or left pleural effusion. Right mastectomy and axillary surgery again noted.  IMPRESSION: New right lower lung airspace disease likely representing pneumonia.  Radiographic follow up to resolution is recommended to exclude other processes. Question tiny right pleural effusion.  Original Report Authenticated By: Rosendo Gros, M.D.     She was given an albuterol nebulizer treatment with no subjective improvement. X-ray is consistent with pneumonia and she is given Rocephin and Zithromax intravenously. Case is discussed with Dr. Karilyn Cota who agrees to admit the patient.    MDM   Acute dyspnea with peripheral edema, need to consider congestive heart failure. Also need to consider pneumonia and pulmonary embolism.     MEDICATIONS GIVEN IN THE E.D. Scheduled Meds:    . albuterol  2.5 mg Nebulization Once  . ipratropium  0.5 mg Nebulization Once   Continuous Infusions:      IMPRESSION: 1. Community acquired bacterial  pneumonia   2. Dyspnea       I personally performed the services described in this documentation, which was scribed in my presence. The recorded information has been reviewed and considered.   Scribe             Barbara Booze, MD 08/26/11 [phone]

## 2011-08-26 NOTE — ED Notes (Signed)
Report called, pt eating dinner meal and awaiting meds from ac.

## 2011-08-26 NOTE — ED Provider Notes (Signed)
Date: 08/26/2011  Rate: 103  Rhythm: sinus tachycardia  QRS Axis: normal  Intervals: normal  ST/T Wave abnormalities: normal  Conduction Disutrbances:none  Narrative Interpretation: Low QRS voltage ECG. When compared with ECG of 03/20/2004, QRS voltage has decreased.  Old EKG Reviewed: changes noted    Dione Booze, MD 08/26/11 1318

## 2011-08-27 LAB — CBC
Hemoglobin: 12.2 g/dL (ref 12.0–15.0)
MCH: 28.8 pg (ref 26.0–34.0)
MCV: 88.7 fL (ref 78.0–100.0)
Platelets: 279 10*3/uL (ref 150–400)
RBC: 4.24 MIL/uL (ref 3.87–5.11)
WBC: 12.1 10*3/uL — ABNORMAL HIGH (ref 4.0–10.5)

## 2011-08-27 LAB — COMPREHENSIVE METABOLIC PANEL
AST: 28 U/L (ref 0–37)
Albumin: 3.4 g/dL — ABNORMAL LOW (ref 3.5–5.2)
BUN: 11 mg/dL (ref 6–23)
Calcium: 9.2 mg/dL (ref 8.4–10.5)
Creatinine, Ser: 0.67 mg/dL (ref 0.50–1.10)
GFR calc non Af Amer: >90 mL/min (ref >90)

## 2011-08-27 MED ORDER — PREDNISONE 20 MG PO TABS: ORAL_TABLET | ORAL | Status: DC

## 2011-08-27 MED ORDER — SODIUM CHLORIDE 0.9 % IJ SOLN
INTRAMUSCULAR | Status: AC
  Administered 2011-08-27: 3 mL
  Filled 2011-08-27: qty 3

## 2011-08-27 MED ORDER — AZITHROMYCIN 500 MG PO TABS: 500.0000 mg | ORAL_TABLET | Freq: Every day | ORAL | Status: AC

## 2011-08-27 MED ORDER — GUAIFENESIN ER 600 MG PO TB12: 1200.0000 mg | ORAL_TABLET | Freq: Two times a day (BID) | ORAL | Status: DC

## 2011-08-27 NOTE — Plan of Care (Signed)
Problem: Discharge Progression Outcomes Goal: Vaccine documented on D/C instructions Outcome: Completed/Met Date Met:  08/27/11 Patient did not receive flu vaccine allergic to latex.

## 2011-08-27 NOTE — Progress Notes (Signed)
Patient's room air sat 92% this am.

## 2011-08-27 NOTE — Progress Notes (Signed)
Patient discharged with instructions,given on medications,and follow up visits,patient verbalized understanding.Prescriptions,and care notes given on medications.No C/O pain or discomfort noted.Accompanied by staff to an awaiting vehicle.

## 2011-08-27 NOTE — Discharge Summary (Signed)
Physician Discharge Summary  Patient ID: OZETTA FLATLEY MRN: [id number] DOB/AGE: 1950/09/16 61 y.o. Primary Care Physician:GATES,DONNA RUTH, MD, MD Admit date: 08/26/2011 Discharge date: 08/27/2011    Discharge Diagnoses:  1. Right lower lobe community acquired pneumonia. 2. Asthma.   Medication List  As of 08/27/2011  9:57 AM   TAKE these medications         albuterol 108 (90 BASE) MCG/ACT inhaler   Commonly known as: PROVENTIL HFA;VENTOLIN HFA   Inhale 2 puffs into the lungs every 6 (six) hours as needed. For shortness of breath      azithromycin 500 MG tablet   Commonly known as: ZITHROMAX   Take 1 tablet (500 mg total) by mouth daily.      guaiFENesin 600 MG 12 hr tablet   Commonly known as: MUCINEX   Take 2 tablets (1,200 mg total) by mouth 2 (two) times daily.      predniSONE 20 MG tablet   Commonly known as: DELTASONE   Take 2 tablets daily for 3 days, then 1 tablet daily for 3 days, then half tablet daily for 3 days, then STOP.            Discharged Condition: Stable and improved.    Consults: None.  Significant Diagnostic Studies: Dg Chest 2 View  08/26/2011  *RADIOLOGY REPORT*  Clinical Data: 61 year old female with cough and shortness of breath.  CHEST - 2 VIEW  Comparison: 08/08/2010  Findings: New airspace disease in the right lower lung is identified likely representing pneumonia. Upper limits normal heart size again identified. There may be a tiny right pleural effusion present. There is no evidence of pneumothorax or left pleural effusion. Right mastectomy and axillary surgery again noted.  IMPRESSION: New right lower lung airspace disease likely representing pneumonia.  Radiographic follow up to resolution is recommended to exclude other processes. Question tiny right pleural effusion.  Original Report Authenticated By: Rosendo Gros, M.D.    Lab Results: Basic Metabolic Panel:  Basename 08/27/11 0536 08/26/11 1333  NA 136 136  K 3.9 4.1  CL 101  101  CO2 24 23  GLUCOSE 146* 101*  BUN 11 12  CREATININE 0.67 0.77  CALCIUM 9.2 10.1  MG -- --  PHOS -- --   Liver Function Tests:  [facility] 08/27/11 0536  AST 28  ALT 54*  ALKPHOS 157*  BILITOT 0.4  PROT 7.1  ALBUMIN 3.4*     CBC:  Basename 08/27/11 0536 08/26/11 1333  WBC 12.1* 12.2*  NEUTROABS -- 9.9*  HGB 12.2 13.2  HCT 37.6 40.3  MCV 88.7 89.4  PLT 279 281    No results found for this or any previous visit (from the past 240 hour(s)).   Hospital Course: This very pleasant 61 year old lady was admitted with symptoms of cough and wheezing. The cough was largely nonproductive. She had no fever. The symptoms have been present for 3-4 days prior to hospitalization. She is known to have asthma which was also exacerbated. She also has a previous history of breast cancer. She was admitted to the hospital and started on intravenous antibiotics and oral steroids. She has done well overnight and this morning feels better. She is saturating 92% on room air. She has gotten her appetite back. Chest x-ray showed right lower lobe pneumonia and  Discharge Exam: Blood pressure 105/72, pulse 95, temperature 97.9 F (36.6 C), temperature source Oral, resp. rate 20, height 5\' 5"  (1.651 m), weight 92.987 kg (205 lb), SpO2 92.00%. She  looks systemically well. She is not toxic or septic. There is no increased work of breathing. There is no peripheral or central cyanosis. Lung fields show scattered bilateral wheezing. There is no bronchial breathing. There are no crackles. Heart sounds are present and normal without murmurs. She is alert and orientated without any focal neurological signs.  Disposition: Home. She will have a tapering course of prednisone and a further 5 day course of Zithromax.  She will followup with her primary care physician and should have a repeat chest x-ray in 4-6 weeks' time to make sure the pneumonia has cleared radiologically.   Discharge Orders    Future Orders  Please Complete By Expires   Diet - low sodium heart healthy      Increase activity slowly         Follow-up Information    Follow up with [name], MD. Schedule an appointment as soon as possible for a visit in 1 week.         SignedWilson Singer Pager [phone]  08/27/2011, 9:57 AM

## 2011-09-03 ENCOUNTER — Other Ambulatory Visit: Payer: Self-pay | Admitting: Family Medicine

## 2011-09-03 ENCOUNTER — Other Ambulatory Visit: Payer: Self-pay

## 2011-09-03 DIAGNOSIS — R0602 Shortness of breath: Secondary | ICD-10-CM

## 2011-09-06 ENCOUNTER — Ambulatory Visit
Admission: RE | Admit: 2011-09-06 | Discharge: 2011-09-06 | Disposition: A | Discharge status: Home / Self Care | Payer: No Typology Code available for payment source | Source: Ambulatory Visit | Attending: Family Medicine | Admitting: Family Medicine

## 2011-09-06 DIAGNOSIS — R0602 Shortness of breath: Secondary | ICD-10-CM

## 2011-09-10 ENCOUNTER — Other Ambulatory Visit (HOSPITAL_COMMUNITY): Payer: Self-pay | Admitting: *Deleted

## 2011-09-10 ENCOUNTER — Encounter (HOSPITAL_COMMUNITY): Payer: Self-pay | Admitting: *Deleted

## 2011-09-10 ENCOUNTER — Institutional Professional Consult (permissible substitution) (INDEPENDENT_AMBULATORY_CARE_PROVIDER_SITE_OTHER): Payer: Self-pay | Admitting: Surgery

## 2011-09-10 ENCOUNTER — Other Ambulatory Visit: Payer: Self-pay

## 2011-09-10 ENCOUNTER — Encounter: Payer: Self-pay | Admitting: *Deleted

## 2011-09-10 ENCOUNTER — Encounter: Payer: Self-pay | Admitting: Surgery

## 2011-09-10 VITALS — BP 131/87 | HR 110 | Resp 20 | Ht 65.0 in | Wt 214.0 lb

## 2011-09-10 DIAGNOSIS — J90 Pleural effusion, not elsewhere classified: Secondary | ICD-10-CM

## 2011-09-10 DIAGNOSIS — I313 Pericardial effusion (noninflammatory): Secondary | ICD-10-CM

## 2011-09-10 DIAGNOSIS — I309 Acute pericarditis, unspecified: Secondary | ICD-10-CM

## 2011-09-10 DIAGNOSIS — I319 Disease of pericardium, unspecified: Secondary | ICD-10-CM

## 2011-09-10 DIAGNOSIS — C801 Malignant (primary) neoplasm, unspecified: Secondary | ICD-10-CM | POA: Insufficient documentation

## 2011-09-10 DIAGNOSIS — R918 Other nonspecific abnormal finding of lung field: Secondary | ICD-10-CM

## 2011-09-10 MED ORDER — DEXTROSE 5 % IV SOLN
1.5000 g | INTRAVENOUS | Status: DC
  Filled 2011-09-10: qty 1.5

## 2011-09-10 MED ORDER — DEXTROSE 5 % IV SOLN
1.5000 g | INTRAVENOUS | Status: AC
  Administered 2011-09-11: 1.5 g via INTRAVENOUS
  Filled 2011-09-10: qty 1.5

## 2011-09-10 NOTE — Progress Notes (Signed)
                  [address]            [place] [address]          [phone]      PCP is GATES,DONNA RUTH, MD, MD  Referring Provider is Gates, Donna Ruth, MD  Chief Complaint   Patient presents with   .  Lung Lesion     Referral from Dr Gates for surgical eval on Lung nodules, Large pericardial effusion and Right pleural effusion, Chest CT, 09/06/2011   HPI:  The patient is a 61-year-old prior smoker with a history of right breast cancer diagnosed in 2005 that was treated with right mastectomy, postoperative chemotherapy, and tamoxifen. She had been followed by Dr. Peter Rubin but has not seen him in a few years. She was seen by Dr. Michael Wert in January 2012 for recurrent episodes of cough. A CT scan the chest at that time showed an 11 mm right lower lobe pulmonary nodule as well as some mild mediastinal adenopathy. A PET scan was recommended by radiology by Dr. Wert did not feel it was indicated at that time. She has had no further followup until she recently presented with possible pneumonia and was hospitalized from 08/26/2011 to 08/27/2011. She was treated with antibiotics and prednisone without resolution of her cough or shortness of breath. She began having fluid retention in her legs and continued to have some pressure sensation in her upper abdomen and lower chest. She subsequently had a CT scan the chest done on 09/06/2011 which showed a large pericardial effusion that was relatively dense. There is also a large right pleural effusion with extensive volume loss and consolidation and atelectasis in the right middle and some atelectasis in the right lower lobe. There was a peripheral pulmonary nodule in the right lower lobe that has increased in size to 12 x 11 mm. There is a new 11 x 9 mm nodule slightly more superiorly in the right lower lobe adjacent to the major fissure. There was narrowing of the right middle lobe bronchus centrally with an abnormal soft tissue  density in the inferior aspect of the right hilum. There were calcified lymph nodes in the subcarinal and precarinal region there were unchanged from the prior exam.  Past Medical History   Diagnosis  Date   .  Asthma    .  Shortness of breath    .  Cancer  2005     breast/chemo R mastectomy   .  Headache      OTC Prilosec   .  Arthritis      left hip   .  Contact dermatitis      from a bandaid that had Latex    Past Surgical History   Procedure  Date   .  Spine surgery  1996   .  Breast surgery  2005     right    Family History   Problem  Relation  Age of Onset   .  Cancer  Mother       breast    .  Heart disease  Father    .  Anesthesia problems  Neg Hx    Social History  History   Substance Use Topics   .  Smoking status:  Former Smoker     Quit date:  09/10/2007   .  Smokeless tobacco:  Not on file   .    Alcohol Use:  No    Current Outpatient Prescriptions   Medication  Sig  Dispense  Refill   .  albuterol (PROVENTIL HFA;VENTOLIN HFA) 108 (90 BASE) MCG/ACT inhaler  Inhale 2 puffs into the lungs every 6 (six) hours as needed. For shortness of breath     .  omeprazole (PRILOSEC) 20 MG capsule  Take 20 mg by mouth daily.      No current facility-administered medications for this visit.    Facility-Administered Medications Ordered in Other Visits   Medication  Dose  Route  Frequency  Provider  Last Rate  Last Dose   .  cefUROXime (ZINACEF) 1.5 g in dextrose 5 % 50 mL IVPB  1.5 g  Intravenous  60 min Pre-Op  Kyrstal Monterrosa K Herndon Grill, MD     .  DISCONTD: cefUROXime (ZINACEF) 1.5 g in dextrose 5 % 50 mL IVPB  1.5 g  Intravenous  60 min Pre-Op  Enedina Pair K Kimble Delaurentis, MD      Allergies   Allergen  Reactions   .  Aspirin      REACTION: upset stomach   .  Latex  Other (See Comments)     Blistering and skin peels off   .  Nsaids  Nausea And Vomiting     Extreme nausea and vomiting   Review of Systems:  General: She denies fever and chills. She has had some weight gain. Appetite has  been stable. She does report fatigue.  Eyes: Negative  ENT: Negative  Endocrine: She denies diabetes and hypothyroidism.  Cardiac: She does report chest tightness and pressure as well as orthopnea and dyspnea with exertion. She has had bilateral lower extremity edema for the past 2-3 weeks. She denies palpitations.  Respiratory: She reports cough but no sputum production. She's had no hemoptysis.  GI: She reports significant reflux symptoms. She denies any melena or bright red blood per rectum.  GU: She denies dysuria and hematuria.  Vascular: She denies claudication and phlebitis.  Neurological: She does report some dizziness. She denies any focal weakness or numbness. She denies headaches.  Musculoskeletal: She does report arthralgias particularly involving her left hip which requires replacement. She reports lower back pain.  Psychiatric: Negative  Hematological: She denies any easy bleeding or bleeding history  BP 131/87  Pulse 110  Resp 20  Ht 5' 5" (1.651 m)  Wt 214 lb (97.07 kg)  BMI 35.61 kg/m2  SpO2 95%  Physical Exam:  She is a well-developed white female in no distress.  HEENT: Normocephalic and atraumatic. Pupils are equal reactive to light and accommodation. Extraocular muscles are intact. Oropharynx is clear.  Neck: Carotid pulses are palpable bilaterally. There are no bruits. There is no adenopathy or thyromegaly.  Lungs: Decreased breath sounds over the right lower lobe.  Heart: Regular rate and rhythm with normal S1 and S2. There is no murmur, rub, or gallop.  Chest: There is a well-healed right mastectomy scar. There are no palpable masses.  Abdomen: Bowel sounds are present. There are no palpable masses or organomegaly.  Extremities: There is moderate bilateral lower extremity edema to the thighs.  Neurological: Alert and oriented x3. Motor and sensory exams are grossly normal.  Diagnostic Tests:  CT CHEST WITHOUT CONTRAST  Technique: Multidetector CT imaging of the  chest was performed  following the standard protocol without IV contrast.  Comparison: Chest x-ray dated 08/25/1998 02/13 and chest CT dated  08/09/2010  Findings: The patient has a large pericardial effusion which is    relatively dense. This could represent hemorrhage or a  proteinaceous effusion. There is an enlarged peripheral pulmonary  nodule in the right lower lobe. This was present on the prior  study of 08/09/2010 but is now 12 x 11 x 11 millimeters. There is  a new 11 x 9 x 5 mm nodule slightly more superiorly in the right  lower lobe adjacent to the major fissure.  There is extensive volume loss and consolidation and atelectasis in  the right middle lobe. There is narrowing of the right middle lobe  bronchus centrally with abnormal soft tissue in the inferior aspect  of the right hilum which may represent infiltrating tumor.  The patient has calcified lymph nodes in the subcarinal and pre  carinal region which are essentially unchanged since the prior  exam.  Large right pleural effusion. The heart size is normal. No acute  osseous abnormality. Visualized portion of the upper abdomen is  normal. Left lung is clear. The patient has had a right mastectomy.  No adenopathy in the axilla.  IMPRESSION:  1. Two nodules in the right lower lobe worrisome for malignancy.  Abnormal soft tissue density in the inferior aspect of the right  hilum narrowing the right lower lobe and right middle lobe bronchi  worrisome for tumor.  2. Large pericardial effusion. I suspect this is a malignant  pericardial effusion.  3. Large right pleural effusion.  Report called to Dr. Westermann by Dr. Maxwell on the 09/06/2011.  Original Report Authenticated By: JAMES H. MAXWELL, M.D.  Impression:  She has 2 right pulmonary nodules and evidence of right middle lobe bronchial obstruction as well as large right pleural and pericardial effusions. There is also significant mediastinal adenopathy and soft  tissue density around the right hilum. This could be metastatic breast cancer or lung cancer. I think the pericardial effusion is significant given the development of lower extremity edema and worsening shortness of breath. I think the best treatment option is to drain the pericardial effusion by subxiphoid pericardial window which will allow cytologic examination of the fluid and pericardial biopsy as well as drainage of the large effusion. I would also plan to drain the right pleural effusion using a Pleurx catheter which will allow cytologic examination of the fluid and ongoing management of the right effusion if it turns out to be malignant. I will plan to perform bronchoscopy while she is under anesthesia to examine the right middle lobe bronchus and biopsy any abnormal areas. She may need a PET scan and MRI of the brain but these do not need to be done prior to the above procedure. I discussed the operative procedure with the patient and her friend who is a nurse including alternatives, benefits and risks; including but not limited to bleeding, infection, recurrence of the effusions, and the possible need for further diagnositic or therapeutic procedures depending on the results. Sharis I Sobczyk understands and agrees to proceed. We will schedule surgery for tomorrow.  Plan:  We will plan to perform flexible bronchoscopy, drainage of the right pleural effusion using a Pleurx catheter, and subxiphoid pericardial window tomorrow morning in the operating room.     

## 2011-09-10 NOTE — Progress Notes (Signed)
I called pt back to see where she had a 2 D Echo done 09/09/11- pt states it was at [facility] on [address]- no results found.  Pt does not have a current cardiologist.

## 2011-09-11 ENCOUNTER — Inpatient Hospital Stay (HOSPITAL_COMMUNITY)
Admission: RE | Admit: 2011-09-11 | Discharge: 2011-09-17 | DRG: 238 | Disposition: A | Discharge status: Home Health | Payer: Medicaid Other | Source: Ambulatory Visit | Attending: Surgery | Admitting: Surgery

## 2011-09-11 ENCOUNTER — Encounter (HOSPITAL_COMMUNITY)
Admission: RE | Disposition: A | Discharge status: Home Health | Payer: Self-pay | Source: Ambulatory Visit | Attending: Surgery

## 2011-09-11 ENCOUNTER — Ambulatory Visit (HOSPITAL_COMMUNITY): Payer: Medicaid Other | Admitting: *Deleted

## 2011-09-11 ENCOUNTER — Other Ambulatory Visit: Payer: Self-pay | Admitting: Surgery

## 2011-09-11 ENCOUNTER — Encounter (HOSPITAL_COMMUNITY): Payer: Self-pay | Admitting: *Deleted

## 2011-09-11 ENCOUNTER — Ambulatory Visit (HOSPITAL_COMMUNITY): Payer: Medicaid Other

## 2011-09-11 DIAGNOSIS — I309 Acute pericarditis, unspecified: Secondary | ICD-10-CM

## 2011-09-11 DIAGNOSIS — Z853 Personal history of malignant neoplasm of breast: Secondary | ICD-10-CM

## 2011-09-11 DIAGNOSIS — R000 Tachycardia, unspecified: Secondary | ICD-10-CM | POA: Diagnosis not present

## 2011-09-11 DIAGNOSIS — Z886 Allergy status to analgesic agent status: Secondary | ICD-10-CM

## 2011-09-11 DIAGNOSIS — R51 Headache: Secondary | ICD-10-CM | POA: Diagnosis present

## 2011-09-11 DIAGNOSIS — Z9010 Acquired absence of unspecified breast and nipple: Secondary | ICD-10-CM

## 2011-09-11 DIAGNOSIS — I318 Other specified diseases of pericardium: Principal | ICD-10-CM | POA: Diagnosis present

## 2011-09-11 DIAGNOSIS — J90 Pleural effusion, not elsewhere classified: Secondary | ICD-10-CM

## 2011-09-11 DIAGNOSIS — Z87891 Personal history of nicotine dependence: Secondary | ICD-10-CM

## 2011-09-11 DIAGNOSIS — J910 Malignant pleural effusion: Secondary | ICD-10-CM | POA: Diagnosis present

## 2011-09-11 DIAGNOSIS — Z91040 Latex allergy status: Secondary | ICD-10-CM

## 2011-09-11 DIAGNOSIS — I519 Heart disease, unspecified: Secondary | ICD-10-CM | POA: Diagnosis not present

## 2011-09-11 HISTORY — DX: Shortness of breath: R06.02

## 2011-09-11 HISTORY — PX: VIDEO BRONCHOSCOPY: SHX5072

## 2011-09-11 HISTORY — PX: PERICARDIAL WINDOW: SHX2213

## 2011-09-11 HISTORY — PX: CHEST TUBE INSERTION: SHX231

## 2011-09-11 LAB — CBC
HCT: 38.8 % (ref 36.0–46.0)
Hemoglobin: 12.3 g/dL (ref 12.0–15.0)
MCH: 28.4 pg (ref 26.0–34.0)
MCHC: 31.7 g/dL (ref 30.0–36.0)
MCV: 89.6 fL (ref 78.0–100.0)

## 2011-09-11 LAB — URINALYSIS, ROUTINE W REFLEX MICROSCOPIC
Glucose, UA: NEGATIVE mg/dL
Ketones, ur: 15 mg/dL — AB
Leukocytes, UA: NEGATIVE
pH: 6.5 (ref 5.0–8.0)

## 2011-09-11 LAB — URINE MICROSCOPIC-ADD ON

## 2011-09-11 LAB — BLOOD GAS, ARTERIAL
Bicarbonate: 26.1 mEq/L — ABNORMAL HIGH (ref 20.0–24.0)
O2 Saturation: 94.2 %
Patient temperature: 98.6
TCO2: 27.3 mmol/L (ref 0–100)

## 2011-09-11 LAB — COMPREHENSIVE METABOLIC PANEL
Alkaline Phosphatase: 136 U/L — ABNORMAL HIGH (ref 39–117)
BUN: 11 mg/dL (ref 6–23)
Calcium: 9.9 mg/dL (ref 8.4–10.5)
GFR calc Af Amer: 85 mL/min — ABNORMAL LOW (ref >90)
Glucose, Bld: 107 mg/dL — ABNORMAL HIGH (ref 70–99)
Total Protein: 7.1 g/dL (ref 6.0–8.3)

## 2011-09-11 LAB — PROTIME-INR: INR: 1.09 (ref 0.00–1.49)

## 2011-09-11 LAB — SURGICAL PCR SCREEN: MRSA, PCR: NEGATIVE

## 2011-09-11 SURGERY — BRONCHOSCOPY, VIDEO-ASSISTED
Anesthesia: General | Site: Chest | Laterality: Right | Wound class: Clean Contaminated

## 2011-09-11 MED ORDER — KCL IN DEXTROSE-NACL 20-5-0.45 MEQ/L-%-% IV SOLN
INTRAVENOUS | Status: DC
  Administered 2011-09-11 – 2011-09-13 (×3): via INTRAVENOUS
  Filled 2011-09-11 (×5): qty 1000

## 2011-09-11 MED ORDER — LACTATED RINGERS IV SOLN
INTRAVENOUS | Status: DC | PRN
  Administered 2011-09-11: 13:00:00 via INTRAVENOUS

## 2011-09-11 MED ORDER — 0.9 % SODIUM CHLORIDE (POUR BTL) OPTIME
TOPICAL | Status: DC | PRN
  Administered 2011-09-11: 2000 mL

## 2011-09-11 MED ORDER — PHENYLEPHRINE HCL 10 MG/ML IJ SOLN
INTRAMUSCULAR | Status: DC | PRN
  Administered 2011-09-11: 40 ug via INTRAVENOUS
  Administered 2011-09-11: 80 ug via INTRAVENOUS

## 2011-09-11 MED ORDER — VECURONIUM BROMIDE 10 MG IV SOLR
INTRAVENOUS | Status: DC | PRN
  Administered 2011-09-11 (×2): 1 mg via INTRAVENOUS
  Administered 2011-09-11: 2 mg via INTRAVENOUS

## 2011-09-11 MED ORDER — MORPHINE SULFATE (PF) 1 MG/ML IV SOLN
INTRAVENOUS | Status: AC
  Administered 2011-09-12: 11.25 mg via INTRAVENOUS
  Filled 2011-09-11: qty 25

## 2011-09-11 MED ORDER — OXYCODONE HCL 5 MG PO TABS
5.0000 mg | ORAL_TABLET | ORAL | Status: DC | PRN
  Administered 2011-09-12 – 2011-09-13 (×4): 5 mg via ORAL
  Administered 2011-09-15 – 2011-09-16 (×2): 10 mg via ORAL
  Administered 2011-09-16 (×2): 5 mg via ORAL
  Administered 2011-09-17: 10 mg via ORAL
  Filled 2011-09-11 (×2): qty 1
  Filled 2011-09-11: qty 2
  Filled 2011-09-11: qty 1
  Filled 2011-09-11 (×2): qty 2
  Filled 2011-09-11: qty 1
  Filled 2011-09-11: qty 2
  Filled 2011-09-11: qty 1

## 2011-09-11 MED ORDER — ALBUTEROL SULFATE HFA 108 (90 BASE) MCG/ACT IN AERS
2.0000 | INHALATION_SPRAY | Freq: Four times a day (QID) | RESPIRATORY_TRACT | Status: DC | PRN
  Filled 2011-09-11: qty 6.7

## 2011-09-11 MED ORDER — MORPHINE SULFATE 2 MG/ML IJ SOLN: 2.0000 mg | INTRAMUSCULAR | Status: DC | PRN

## 2011-09-11 MED ORDER — PROPOFOL 10 MG/ML IV EMUL
INTRAVENOUS | Status: DC | PRN
  Administered 2011-09-11: 200 mg via INTRAVENOUS

## 2011-09-11 MED ORDER — ONDANSETRON HCL 4 MG/2ML IJ SOLN: 4.0000 mg | Freq: Once | INTRAMUSCULAR | Status: DC | PRN

## 2011-09-11 MED ORDER — NEOSTIGMINE METHYLSULFATE 1 MG/ML IJ SOLN
INTRAMUSCULAR | Status: DC | PRN
  Administered 2011-09-11: 5 mg via INTRAVENOUS

## 2011-09-11 MED ORDER — MUPIROCIN 2 % EX OINT
TOPICAL_OINTMENT | CUTANEOUS | Status: AC
  Administered 2011-09-11: 1 via NASAL
  Filled 2011-09-11: qty 22

## 2011-09-11 MED ORDER — DIPHENHYDRAMINE HCL 12.5 MG/5ML PO ELIX
12.5000 mg | ORAL_SOLUTION | Freq: Four times a day (QID) | ORAL | Status: DC | PRN
  Filled 2011-09-11: qty 5

## 2011-09-11 MED ORDER — PANTOPRAZOLE SODIUM 40 MG PO TBEC
40.0000 mg | DELAYED_RELEASE_TABLET | Freq: Every day | ORAL | Status: DC
  Administered 2011-09-12 – 2011-09-17 (×6): 40 mg via ORAL
  Filled 2011-09-11 (×5): qty 1

## 2011-09-11 MED ORDER — ROCURONIUM BROMIDE 100 MG/10ML IV SOLN
INTRAVENOUS | Status: DC | PRN
  Administered 2011-09-11: 50 mg via INTRAVENOUS

## 2011-09-11 MED ORDER — LIDOCAINE HCL (CARDIAC) 20 MG/ML IV SOLN
INTRAVENOUS | Status: DC | PRN
  Administered 2011-09-11: 100 mg via INTRAVENOUS

## 2011-09-11 MED ORDER — ONDANSETRON HCL 4 MG/2ML IJ SOLN
4.0000 mg | Freq: Four times a day (QID) | INTRAMUSCULAR | Status: DC | PRN
  Administered 2011-09-11: 4 mg via INTRAVENOUS
  Filled 2011-09-11: qty 2

## 2011-09-11 MED ORDER — DIPHENHYDRAMINE HCL 50 MG/ML IJ SOLN: 12.5000 mg | Freq: Four times a day (QID) | INTRAMUSCULAR | Status: DC | PRN

## 2011-09-11 MED ORDER — SODIUM CHLORIDE 0.9 % IJ SOLN: 9.0000 mL | INTRAMUSCULAR | Status: DC | PRN

## 2011-09-11 MED ORDER — EPHEDRINE SULFATE 50 MG/ML IJ SOLN
INTRAMUSCULAR | Status: DC | PRN
  Administered 2011-09-11: 10 mg via INTRAVENOUS

## 2011-09-11 MED ORDER — PHENYLEPHRINE HCL 10 MG/ML IJ SOLN
10.0000 mg | INTRAVENOUS | Status: DC | PRN
  Administered 2011-09-11: 10 ug/min via INTRAVENOUS

## 2011-09-11 MED ORDER — LACTATED RINGERS IV SOLN
INTRAVENOUS | Status: DC | PRN
  Administered 2011-09-11: 11:00:00 via INTRAVENOUS

## 2011-09-11 MED ORDER — LACTATED RINGERS IV SOLN
INTRAVENOUS | Status: DC
  Administered 2011-09-11: 11:00:00 via INTRAVENOUS

## 2011-09-11 MED ORDER — ONDANSETRON HCL 4 MG/2ML IJ SOLN: 4.0000 mg | Freq: Four times a day (QID) | INTRAMUSCULAR | Status: DC | PRN

## 2011-09-11 MED ORDER — SODIUM CHLORIDE 0.9 % IJ SOLN
3.0000 mL | Freq: Two times a day (BID) | INTRAMUSCULAR | Status: DC
  Administered 2011-09-11 – 2011-09-13 (×5): 3 mL via INTRAVENOUS

## 2011-09-11 MED ORDER — PROMETHAZINE HCL 25 MG/ML IJ SOLN: 12.5000 mg | Freq: Four times a day (QID) | INTRAMUSCULAR | Status: DC | PRN

## 2011-09-11 MED ORDER — SODIUM CHLORIDE 0.9 % IV SOLN: 250.0000 mL | INTRAVENOUS | Status: DC | PRN

## 2011-09-11 MED ORDER — MORPHINE SULFATE (PF) 1 MG/ML IV SOLN
INTRAVENOUS | Status: DC
  Administered 2011-09-11: 21 mg via INTRAVENOUS
  Administered 2011-09-11 (×2): via INTRAVENOUS
  Administered 2011-09-12: 6 mg via INTRAVENOUS
  Administered 2011-09-12: 13.19 mg via INTRAVENOUS
  Administered 2011-09-12: 16.5 mg via INTRAVENOUS
  Administered 2011-09-12 (×2): via INTRAVENOUS
  Administered 2011-09-12: 16.26 mg via INTRAVENOUS
  Filled 2011-09-11 (×2): qty 25

## 2011-09-11 MED ORDER — 0.9 % SODIUM CHLORIDE (POUR BTL) OPTIME
TOPICAL | Status: DC | PRN
  Administered 2011-09-11: 1000 mL

## 2011-09-11 MED ORDER — HYDROMORPHONE HCL PF 1 MG/ML IJ SOLN
0.2500 mg | INTRAMUSCULAR | Status: DC | PRN
  Administered 2011-09-11 (×2): 0.5 mg via INTRAVENOUS

## 2011-09-11 MED ORDER — ACETAMINOPHEN 650 MG RE SUPP: 650.0000 mg | RECTAL | Status: DC | PRN

## 2011-09-11 MED ORDER — GLYCOPYRROLATE 0.2 MG/ML IJ SOLN
INTRAMUSCULAR | Status: DC | PRN
  Administered 2011-09-11: .8 mg via INTRAVENOUS

## 2011-09-11 MED ORDER — NALOXONE HCL 0.4 MG/ML IJ SOLN: 0.4000 mg | INTRAMUSCULAR | Status: DC | PRN

## 2011-09-11 MED ORDER — SODIUM CHLORIDE 0.9 % IJ SOLN: 3.0000 mL | INTRAMUSCULAR | Status: DC | PRN

## 2011-09-11 MED ORDER — ACETAMINOPHEN 325 MG PO TABS
650.0000 mg | ORAL_TABLET | ORAL | Status: DC | PRN
  Administered 2011-09-12: 650 mg via ORAL
  Filled 2011-09-11: qty 2

## 2011-09-11 MED ORDER — ONDANSETRON HCL 4 MG/2ML IJ SOLN
INTRAMUSCULAR | Status: DC | PRN
  Administered 2011-09-11 (×2): 4 mg via INTRAVENOUS

## 2011-09-11 MED ORDER — FENTANYL CITRATE 0.05 MG/ML IJ SOLN
INTRAMUSCULAR | Status: DC | PRN
  Administered 2011-09-11: 50 ug via INTRAVENOUS
  Administered 2011-09-11: 100 ug via INTRAVENOUS
  Administered 2011-09-11 (×2): 50 ug via INTRAVENOUS

## 2011-09-11 SURGICAL SUPPLY — 69 items
ATTRACTOMAT 16X20 MAGNETIC DRP (DRAPES) ×4 IMPLANT
BRUSH CYTOL CELLEBRITY 1.5X140 (MISCELLANEOUS) ×4 IMPLANT
CANISTER SUCTION 2500CC (MISCELLANEOUS) ×4 IMPLANT
CATH THORACIC 28FR (CATHETERS) IMPLANT
CATH THORACIC 28FR RT ANG (CATHETERS) IMPLANT
CATH THORACIC 36FR (CATHETERS) IMPLANT
CATH THORACIC 36FR RT ANG (CATHETERS) ×4 IMPLANT
CLOTH BEACON ORANGE TIMEOUT ST (SAFETY) ×4 IMPLANT
CONT SPEC 4OZ CLIKSEAL STRL BL (MISCELLANEOUS) ×12 IMPLANT
COTTONBALL LRG STERILE PKG (GAUZE/BANDAGES/DRESSINGS) IMPLANT
COVER SURGICAL LIGHT HANDLE (MISCELLANEOUS) ×8 IMPLANT
COVER TABLE BACK 60X90 (DRAPES) ×4 IMPLANT
DERMABOND ADVANCED (GAUZE/BANDAGES/DRESSINGS) ×2
DERMABOND ADVANCED .7 DNX12 (GAUZE/BANDAGES/DRESSINGS) ×6 IMPLANT
DRAIN CHANNEL 28F RND 3/8 FF (WOUND CARE) IMPLANT
DRAPE C-ARM 42X72 X-RAY (DRAPES) ×4 IMPLANT
DRAPE LAPAROSCOPIC ABDOMINAL (DRAPES) ×4 IMPLANT
DRAPE PROXIMA HALF (DRAPES) ×4 IMPLANT
ELECT REM PT RETURN 9FT ADLT (ELECTROSURGICAL) ×4
ELECTRODE REM PT RTRN 9FT ADLT (ELECTROSURGICAL) ×3 IMPLANT
FORCEPS BIOP RJ4 1.8 (CUTTING FORCEPS) ×4 IMPLANT
GLOVE BIOGEL PI IND STRL 6 (GLOVE) ×6 IMPLANT
GLOVE BIOGEL PI IND STRL 6.5 (GLOVE) ×6 IMPLANT
GLOVE BIOGEL PI INDICATOR 6 (GLOVE) ×2
GLOVE BIOGEL PI INDICATOR 6.5 (GLOVE) ×2
GLOVE EUDERMIC 7 POWDERFREE (GLOVE) IMPLANT
GLOVE SURG SS PI 7.0 STRL IVOR (GLOVE) ×8 IMPLANT
GOWN PREVENTION PLUS XLARGE (GOWN DISPOSABLE) ×4 IMPLANT
GOWN STRL NON-REIN LRG LVL3 (GOWN DISPOSABLE) ×8 IMPLANT
HEMOSTAT POWDER SURGIFOAM 1G (HEMOSTASIS) IMPLANT
KIT BASIN OR (CUSTOM PROCEDURE TRAY) ×4 IMPLANT
KIT PLEURX DRAIN CATH 15.5FR (DRAIN) ×4 IMPLANT
KIT ROOM TURNOVER OR (KITS) ×4 IMPLANT
MARKER SKIN DUAL TIP RULER LAB (MISCELLANEOUS) ×4 IMPLANT
NEEDLE 22X1 1/2 (OR ONLY) (NEEDLE) IMPLANT
NEEDLE BIOPSY TRANSBRONCH 21G (NEEDLE) IMPLANT
NS IRRIG 1000ML POUR BTL (IV SOLUTION) ×12 IMPLANT
OIL SILICONE PENTAX (PARTS (SERVICE/REPAIRS)) ×4 IMPLANT
PACK CHEST (CUSTOM PROCEDURE TRAY) ×4 IMPLANT
PACK GENERAL/GYN (CUSTOM PROCEDURE TRAY) IMPLANT
PAD ARMBOARD 7.5X6 YLW CONV (MISCELLANEOUS) ×8 IMPLANT
PAD ELECT DEFIB RADIOL ZOLL (MISCELLANEOUS) ×4 IMPLANT
SET DRAINAGE LINE (MISCELLANEOUS) IMPLANT
SPONGE GAUZE 4X4 12PLY (GAUZE/BANDAGES/DRESSINGS) ×4 IMPLANT
SUT ETHILON 3 0 FSL (SUTURE) ×4 IMPLANT
SUT SILK  1 MH (SUTURE) ×1
SUT SILK 1 MH (SUTURE) ×3 IMPLANT
SUT VIC AB 1 CTX 18 (SUTURE) ×4 IMPLANT
SUT VIC AB 2-0 CT1 27 (SUTURE) ×1
SUT VIC AB 2-0 CT1 TAPERPNT 27 (SUTURE) ×3 IMPLANT
SUT VIC AB 3-0 X1 27 (SUTURE) ×4 IMPLANT
SWAB COLLECTION DEVICE MRSA (MISCELLANEOUS) IMPLANT
SYR 20ML ECCENTRIC (SYRINGE) ×4 IMPLANT
SYR 50ML SLIP (SYRINGE) IMPLANT
SYR 5ML LUER SLIP (SYRINGE) IMPLANT
SYR CONTROL 10ML LL (SYRINGE) IMPLANT
SYRINGE 10CC LL (SYRINGE) IMPLANT
SYSTEM SAHARA CHEST DRAIN ATS (WOUND CARE) ×4 IMPLANT
TAPE CLOTH SURG 4X10 WHT LF (GAUZE/BANDAGES/DRESSINGS) ×4 IMPLANT
TOWEL OR 17X24 6PK STRL BLUE (TOWEL DISPOSABLE) ×4 IMPLANT
TOWEL OR 17X26 10 PK STRL BLUE (TOWEL DISPOSABLE) ×4 IMPLANT
TRAP SPECIMEN MUCOUS 40CC (MISCELLANEOUS) ×8 IMPLANT
TRAY FOLEY CATH 14FRSI W/METER (CATHETERS) ×4 IMPLANT
TUBE ANAEROBIC SPECIMEN COL (MISCELLANEOUS) IMPLANT
TUBE CONNECTING 12X1/4 (SUCTIONS) ×4 IMPLANT
VALVE BIOPSY  SINGLE USE (MISCELLANEOUS)
VALVE BIOPSY SINGLE USE (MISCELLANEOUS) IMPLANT
VALVE SUCTION BRONCHIO DISP (MISCELLANEOUS) IMPLANT
WATER STERILE IRR 1000ML POUR (IV SOLUTION) ×8 IMPLANT

## 2011-09-11 NOTE — Preoperative (Signed)
Beta Blockers   Reason not to administer Beta Blockers:Not Applicable 

## 2011-09-11 NOTE — Significant Event (Signed)
Received patient to 2305 from PACU. Pt with chest tube and Right pleural drainage catheter that is not in used. Chest tube marked and per report from PACU, output was 140, whereas output at admission to SICU is 20cc. Therefore total output thus far from chest tube is 160cc. Will monitor. Juwann Sherk, Charity fundraiser.

## 2011-09-11 NOTE — OR Nursing (Signed)
Start of bronchoscopy 1306; end at 1324. Start of pericardial window 1340. Start of insertion of pleurex catheter 1400; end at 1417.

## 2011-09-11 NOTE — Progress Notes (Signed)
Filed Vitals:   09/11/11 2000 09/11/11 2038 09/11/11 2100 09/11/11 2200  BP: 107/63  103/61 99/60  Pulse: 101  102 102  Temp:      TempSrc:      Resp: 9 12 8 9   Height:      Weight:      SpO2: 96% 93% 95% 97%   CT output low Urine output good

## 2011-09-11 NOTE — Interval H&P Note (Signed)
History and Physical Interval Note:  09/11/2011 11:16 AM  [name]  has presented today for surgery, with the diagnosis of LARGE PERICARDIAL EFFUSION & (R) PLEURAL EFFUSION  The various methods of treatment have been discussed with the patient and family. After consideration of risks, benefits and other options for treatment, the patient has consented to  Procedure(s) (LRB): VIDEO BRONCHOSCOPY (N/A) INSERTION PLEURAL DRAINAGE CATHETER (Right) PERICARDIAL WINDOW (N/A) as a surgical intervention .  The patients' history has been reviewed, patient examined, no change in status, stable for surgery.  I have reviewed the patients' chart and labs.  Questions were answered to the patient's satisfaction.     Barbara Parks

## 2011-09-11 NOTE — Anesthesia Preprocedure Evaluation (Addendum)
Anesthesia Evaluation  Patient identified by MRN, date of birth, ID band Patient awake    Reviewed: Allergy & Precautions, H&P , NPO status , Patient's Chart, lab work & pertinent test results  Airway Mallampati: II TM Distance: <3 FB Neck ROM: Full    Dental  (+) Teeth Intact and Dental Advisory Given   Pulmonary shortness of breath, at rest and lying, asthma ,          Cardiovascular regular Normal    Neuro/Psych  Headaches,    GI/Hepatic   Endo/Other    Renal/GU      Musculoskeletal  (+) Arthritis -,   Abdominal   Peds  Hematology   Anesthesia Other Findings   Reproductive/Obstetrics                         Anesthesia Physical Anesthesia Plan  ASA: III  Anesthesia Plan: General ETT   Post-op Pain Management:    Induction: Intravenous  Airway Management Planned: Oral ETT  Additional Equipment: Arterial line and CVP  Intra-op Plan:   Post-operative Plan: Extubation in OR  Informed Consent: I have reviewed the patients History and Physical, chart, labs and discussed the procedure including the risks, benefits and alternatives for the proposed anesthesia with the patient or authorized representative who has indicated his/her understanding and acceptance.     Plan Discussed with: Anesthesiologist, CRNA and Surgeon  Anesthesia Plan Comments:         Anesthesia Quick Evaluation

## 2011-09-11 NOTE — Brief Op Note (Addendum)
09/11/2011  2:11 PM  PATIENT:  Barbara Parks  61 y.o. female  PRE-OPERATIVE DIAGNOSIS:  Large pericardial and right pleural effusion, h/o breast cancer  POST-OPERATIVE DIAGNOSIS:  Large pericardial and right pleural effusion, h/o breast cancer  PROCEDURE:  Procedure(s): VIDEO BRONCHOSCOPY with biopsies, brushings of RML, bronchus intermedius DRAINAGE OF RIGHT PLEURAL EFFUSION, INSERTION RIGHT PLEURAL DRAINAGE CATHETER SUBXIPHOID PERICARDIAL WINDOW  SURGEON:  Surgeon(s): Alleen Borne, MD  ASSISTANT: Coral Ceo, PA-C  ANESTHESIA:   general  SPECIMEN:  Source of Specimen:  pericardium, pleural fluid, pericardial fluid  DISPOSITION OF SPECIMEN:  Pathology  DRAINS: 36 Fr CT  PATIENT CONDITION:  PACU - hemodynamically stable.  FINDINGS: 700 ml bloody pericardial fluid and 700 ml serous pleural fluid drained

## 2011-09-11 NOTE — Anesthesia Postprocedure Evaluation (Signed)
  Anesthesia Post-op Note  Patient: Barbara Parks  Procedure(s) Performed: Procedure(s) (LRB): VIDEO BRONCHOSCOPY (N/A) INSERTION PLEURAL DRAINAGE CATHETER (Right) PERICARDIAL WINDOW (N/A)  Patient Location: PACU  Anesthesia Type: General  Level of Consciousness: awake, oriented, sedated and patient cooperative  Airway and Oxygen Therapy: Patient Spontanous Breathing and Patient connected to face mask oxygen  Post-op Pain: moderate  Post-op Assessment: Post-op Vital signs reviewed, Patient's Cardiovascular Status Stable, Respiratory Function Stable, Patent Airway, No signs of Nausea or vomiting and Pain level controlled  Post-op Vital Signs: stable  Complications: No apparent anesthesia complications

## 2011-09-11 NOTE — OR Nursing (Signed)
Beige colored underwear removed prior to positioning and placed in biohazard bad with patient label. Will send with patient post-op.

## 2011-09-11 NOTE — H&P (Signed)
[address]            [name] [address]          [phone]      PCP is [name], MD, MD  Referring Provider is [name], MD  Chief Complaint   Patient presents with   .  Lung Lesion     Referral from Dr Kevan Ny for surgical eval on Lung nodules, Large pericardial effusion and Right pleural effusion, Chest CT, 09/06/2011   HPI:  The patient is a 61 year old prior smoker with a history of right breast cancer diagnosed in 2005 that was treated with right mastectomy, postoperative chemotherapy, and tamoxifen. She had been followed by Dr. Pierce Crane but has not seen him in a few years. She was seen by Dr. Sandrea Hughs in January 2012 for recurrent episodes of cough. A CT scan the chest at that time showed an 11 mm right lower lobe pulmonary nodule as well as some mild mediastinal adenopathy. A PET scan was recommended by radiology by Dr. Sherene Sires did not feel it was indicated at that time. She has had no further followup until she recently presented with possible pneumonia and was hospitalized from 08/26/2011 to 08/27/2011. She was treated with antibiotics and prednisone without resolution of her cough or shortness of breath. She began having fluid retention in her legs and continued to have some pressure sensation in her upper abdomen and lower chest. She subsequently had a CT scan the chest done on 09/06/2011 which showed a large pericardial effusion that was relatively dense. There is also a large right pleural effusion with extensive volume loss and consolidation and atelectasis in the right middle and some atelectasis in the right lower lobe. There was a peripheral pulmonary nodule in the right lower lobe that has increased in size to 12 x 11 mm. There is a new 11 x 9 mm nodule slightly more superiorly in the right lower lobe adjacent to the major fissure. There was narrowing of the right middle lobe bronchus centrally with an abnormal soft tissue  density in the inferior aspect of the right hilum. There were calcified lymph nodes in the subcarinal and precarinal region there were unchanged from the prior exam.  Past Medical History   Diagnosis  Date   .  Asthma    .  Shortness of breath    .  Cancer  2005     breast/chemo R mastectomy   .  Headache      OTC Prilosec   .  Arthritis      left hip   .  Contact dermatitis      from a bandaid that had Latex    Past Surgical History   Procedure  Date   .  Spine surgery  1996   .  Breast surgery  2005     right    Family History   Problem  Relation  Age of Onset   .  Cancer  Mother       breast    .  Heart disease  Father    .  Anesthesia problems  Neg Hx    Social History  History   Substance Use Topics   .  Smoking status:  Former Smoker     Quit date:  09/10/2007   .  Smokeless tobacco:  Not on file   .  Alcohol Use:  No    Current Outpatient Prescriptions   Medication  Sig  Dispense  Refill   .  albuterol (PROVENTIL HFA;VENTOLIN HFA) 108 (90 BASE) MCG/ACT inhaler  Inhale 2 puffs into the lungs every 6 (six) hours as needed. For shortness of breath     .  omeprazole (PRILOSEC) 20 MG capsule  Take 20 mg by mouth daily.      No current facility-administered medications for this visit.    Facility-Administered Medications Ordered in Other Visits   Medication  Dose  Route  Frequency  Provider  Last Rate  Last Dose   .  cefUROXime (ZINACEF) 1.5 g in dextrose 5 % 50 mL IVPB  1.5 g  Intravenous  60 min Pre-Op  Alleen Borne, MD     .  DISCONTD: cefUROXime (ZINACEF) 1.5 g in dextrose 5 % 50 mL IVPB  1.5 g  Intravenous  60 min Pre-Op  Alleen Borne, MD      Allergies   Allergen  Reactions   .  Aspirin      REACTION: upset stomach   .  Latex  Other (See Comments)     Blistering and skin peels off   .  Nsaids  Nausea And Vomiting     Extreme nausea and vomiting   Review of Systems:  General: She denies fever and chills. She has had some weight gain. Appetite has  been stable. She does report fatigue.  Eyes: Negative  ENT: Negative  Endocrine: She denies diabetes and hypothyroidism.  Cardiac: She does report chest tightness and pressure as well as orthopnea and dyspnea with exertion. She has had bilateral lower extremity edema for the past 2-3 weeks. She denies palpitations.  Respiratory: She reports cough but no sputum production. She's had no hemoptysis.  GI: She reports significant reflux symptoms. She denies any melena or bright red blood per rectum.  GU: She denies dysuria and hematuria.  Vascular: She denies claudication and phlebitis.  Neurological: She does report some dizziness. She denies any focal weakness or numbness. She denies headaches.  Musculoskeletal: She does report arthralgias particularly involving her left hip which requires replacement. She reports lower back pain.  Psychiatric: Negative  Hematological: She denies any easy bleeding or bleeding history  BP 131/87  Pulse 110  Resp 20  Ht 5\' 5"  (1.651 m)  Wt 214 lb (97.07 kg)  BMI 35.61 kg/m2  SpO2 95%  Physical Exam:  She is a well-developed white female in no distress.  HEENT: Normocephalic and atraumatic. Pupils are equal reactive to light and accommodation. Extraocular muscles are intact. Oropharynx is clear.  Neck: Carotid pulses are palpable bilaterally. There are no bruits. There is no adenopathy or thyromegaly.  Lungs: Decreased breath sounds over the right lower lobe.  Heart: Regular rate and rhythm with normal S1 and S2. There is no murmur, rub, or gallop.  Chest: There is a well-healed right mastectomy scar. There are no palpable masses.  Abdomen: Bowel sounds are present. There are no palpable masses or organomegaly.  Extremities: There is moderate bilateral lower extremity edema to the thighs.  Neurological: Alert and oriented x3. Motor and sensory exams are grossly normal.  Diagnostic Tests:  CT CHEST WITHOUT CONTRAST  Technique: Multidetector CT imaging of the  chest was performed  following the standard protocol without IV contrast.  Comparison: Chest x-ray dated 08/25/1998 02/13 and chest CT dated  08/09/2010  Findings: The patient has a large pericardial effusion which is  relatively dense. This could represent hemorrhage or a  proteinaceous effusion. There is an enlarged peripheral pulmonary  nodule in the right lower lobe. This was present on the prior  study of 08/09/2010 but is now 12 x 11 x 11 millimeters. There is  a new 11 x 9 x 5 mm nodule slightly more superiorly in the right  lower lobe adjacent to the major fissure.  There is extensive volume loss and consolidation and atelectasis in  the right middle lobe. There is narrowing of the right middle lobe  bronchus centrally with abnormal soft tissue in the inferior aspect  of the right hilum which may represent infiltrating tumor.  The patient has calcified lymph nodes in the subcarinal and pre  carinal region which are essentially unchanged since the prior  exam.  Large right pleural effusion. The heart size is normal. No acute  osseous abnormality. Visualized portion of the upper abdomen is  normal. Left lung is clear. The patient has had a right mastectomy.  No adenopathy in the axilla.  IMPRESSION:  1. Two nodules in the right lower lobe worrisome for malignancy.  Abnormal soft tissue density in the inferior aspect of the right  hilum narrowing the right lower lobe and right middle lobe bronchi  worrisome for tumor.  2. Large pericardial effusion. I suspect this is a malignant  pericardial effusion.  3. Large right pleural effusion.  Report called to Dr. Gerri Spore by Dr. Jena Gauss on the 09/06/2011.  Original Report Authenticated By: Gwynn Burly, M.D.  Impression:  She has 2 right pulmonary nodules and evidence of right middle lobe bronchial obstruction as well as large right pleural and pericardial effusions. There is also significant mediastinal adenopathy and soft  tissue density around the right hilum. This could be metastatic breast cancer or lung cancer. I think the pericardial effusion is significant given the development of lower extremity edema and worsening shortness of breath. I think the best treatment option is to drain the pericardial effusion by subxiphoid pericardial window which will allow cytologic examination of the fluid and pericardial biopsy as well as drainage of the large effusion. I would also plan to drain the right pleural effusion using a Pleurx catheter which will allow cytologic examination of the fluid and ongoing management of the right effusion if it turns out to be malignant. I will plan to perform bronchoscopy while she is under anesthesia to examine the right middle lobe bronchus and biopsy any abnormal areas. She may need a PET scan and MRI of the brain but these do not need to be done prior to the above procedure. I discussed the operative procedure with the patient and her friend who is a nurse including alternatives, benefits and risks; including but not limited to bleeding, infection, recurrence of the effusions, and the possible need for further diagnositic or therapeutic procedures depending on the results. Barbara Parks understands and agrees to proceed. We will schedule surgery for tomorrow.  Plan:  We will plan to perform flexible bronchoscopy, drainage of the right pleural effusion using a Pleurx catheter, and subxiphoid pericardial window tomorrow morning in the operating room.

## 2011-09-11 NOTE — Transfer of Care (Signed)
Immediate Anesthesia Transfer of Care Note  Patient: Barbara Parks  Procedure(s) Performed: Procedure(s) (LRB): VIDEO BRONCHOSCOPY (N/A) INSERTION PLEURAL DRAINAGE CATHETER (Right) PERICARDIAL WINDOW (N/A)  Patient Location: PACU  Anesthesia Type: General  Level of Consciousness: sedated  Airway & Oxygen Therapy: Patient Spontanous Breathing and Patient connected to face mask oxygen  Post-op Assessment: Report given to PACU RN and Post -op Vital signs reviewed and stable  Post vital signs: Reviewed and stable  Complications: No apparent anesthesia complications

## 2011-09-11 NOTE — Anesthesia Procedure Notes (Signed)
Procedure Name: Intubation Date/Time: 09/11/2011 11:52 AM Performed by: Malachi Pro Pre-anesthesia Checklist: Patient identified, Emergency Drugs available, Suction available, Patient being monitored and Timeout performed Patient Re-evaluated:Patient Re-evaluated prior to inductionOxygen Delivery Method: Circle System Utilized Preoxygenation: Pre-oxygenation with 100% oxygen Intubation Type: Combination inhalational/ intravenous induction Ventilation: Mask ventilation without difficulty Laryngoscope Size: Mac and 3 Grade View: Grade II Tube size: 8.5 mm Number of attempts: 1 Airway Equipment and Method: stylet Placement Confirmation: ETT inserted through vocal cords under direct vision,  CO2 detector and breath sounds checked- equal and bilateral Secured at: 23 cm Tube secured with: Tape Dental Injury: Teeth and Oropharynx as per pre-operative assessment

## 2011-09-12 ENCOUNTER — Inpatient Hospital Stay (HOSPITAL_COMMUNITY): Payer: Medicaid Other

## 2011-09-12 ENCOUNTER — Encounter: Payer: Self-pay | Admitting: Thoracic Surgery (Cardiothoracic Vascular Surgery)

## 2011-09-12 ENCOUNTER — Other Ambulatory Visit: Payer: Self-pay | Admitting: Surgery

## 2011-09-12 ENCOUNTER — Encounter (HOSPITAL_COMMUNITY): Payer: Self-pay | Admitting: Surgery

## 2011-09-12 LAB — BASIC METABOLIC PANEL
BUN: 8 mg/dL (ref 6–23)
Calcium: 8.5 mg/dL (ref 8.4–10.5)
Creatinine, Ser: 0.66 mg/dL (ref 0.50–1.10)
GFR calc Af Amer: >90 mL/min (ref >90)
GFR calc non Af Amer: >90 mL/min (ref >90)
Potassium: 4.4 mEq/L (ref 3.5–5.1)

## 2011-09-12 LAB — CBC
Hemoglobin: 12.3 g/dL (ref 12.0–15.0)
MCH: 29.1 pg (ref 26.0–34.0)
MCHC: 32.5 g/dL (ref 30.0–36.0)
Platelets: 245 10*3/uL (ref 150–400)
RDW: 13.9 % (ref 11.5–15.5)

## 2011-09-12 MED ORDER — MORPHINE SULFATE (PF) 1 MG/ML IV SOLN
INTRAVENOUS | Status: AC
  Filled 2011-09-12: qty 25

## 2011-09-12 MED ORDER — SODIUM CHLORIDE 0.9 % IJ SOLN: 9.0000 mL | INTRAMUSCULAR | Status: DC | PRN

## 2011-09-12 MED ORDER — DIPHENHYDRAMINE HCL 12.5 MG/5ML PO ELIX
12.5000 mg | ORAL_SOLUTION | Freq: Four times a day (QID) | ORAL | Status: DC | PRN
  Administered 2011-09-12 – 2011-09-13 (×2): 12.5 mg via ORAL
  Filled 2011-09-12: qty 5

## 2011-09-12 MED ORDER — MORPHINE SULFATE (PF) 1 MG/ML IV SOLN
INTRAVENOUS | Status: DC
  Administered 2011-09-12: 21:00:00 via INTRAVENOUS
  Administered 2011-09-12: 1.5 mg via INTRAVENOUS
  Administered 2011-09-12: 6.99 mg via INTRAVENOUS
  Administered 2011-09-13: 06:00:00 via INTRAVENOUS
  Administered 2011-09-13: 12 mg via INTRAVENOUS
  Administered 2011-09-13: 7 mg via INTRAVENOUS
  Administered 2011-09-13: 16:00:00 via INTRAVENOUS
  Administered 2011-09-13: 4 mg via INTRAVENOUS
  Administered 2011-09-13: 14 mg via INTRAVENOUS
  Administered 2011-09-13: 7 mg via INTRAVENOUS
  Administered 2011-09-13: 18.87 mg via INTRAVENOUS
  Administered 2011-09-14: 10 mg via INTRAVENOUS
  Administered 2011-09-14: 0.398 mg via INTRAVENOUS
  Administered 2011-09-14: 4.98 mg via INTRAVENOUS
  Administered 2011-09-14: 6 mg via INTRAVENOUS
  Filled 2011-09-12: qty 25

## 2011-09-12 MED ORDER — DIPHENHYDRAMINE HCL 50 MG/ML IJ SOLN
12.5000 mg | Freq: Four times a day (QID) | INTRAMUSCULAR | Status: DC | PRN
  Administered 2011-09-14: 12.5 mg via INTRAVENOUS
  Filled 2011-09-12: qty 1

## 2011-09-12 MED ORDER — ONDANSETRON HCL 4 MG/2ML IJ SOLN: 4.0000 mg | Freq: Four times a day (QID) | INTRAMUSCULAR | Status: DC | PRN

## 2011-09-12 MED ORDER — NALOXONE HCL 0.4 MG/ML IJ SOLN: 0.4000 mg | INTRAMUSCULAR | Status: DC | PRN

## 2011-09-12 NOTE — Progress Notes (Signed)
Patient ID: Barbara Parks, female   DOB: 1951/04/30, 61 y.o.   MRN: [id number] Patient examined and record reviewed.Hemodynamics stable,labs satisfactory.Patient had stable day.Continue current care.Urine output improving Barbara Parks,Barbara Parks 09/12/2011

## 2011-09-12 NOTE — Progress Notes (Signed)
1 Day Post-Op Procedure(s) (LRB): VIDEO BRONCHOSCOPY (N/A) INSERTION PLEURAL DRAINAGE CATHETER (Right) PERICARDIAL WINDOW (N/A) Subjective: Pain under better control  Objective: Vital signs in last 24 hours: Temp:  [97 F (36.1 C)-99.4 F (37.4 C)] 97.9 F (36.6 C) (02/14 0749) Pulse Rate:  [101-110] 109  (02/14 1000) Cardiac Rhythm:  [-] Sinus tachycardia (02/14 1000) Resp:  [7-24] 18  (02/14 1000) BP: (92-145)/(50-77) 113/66 mmHg (02/14 1000) SpO2:  [93 %-98 %] 94 % (02/14 1000) Arterial Line BP: (111-150)/(58-73) 121/58 mmHg (02/13 2200) Weight:  [92.08 kg (203 lb)] 92.08 kg (203 lb) (02/14 0600)  Hemodynamic parameters for last 24 hours:    Intake/Output from previous day: 02/13 0701 - 02/14 0700 In: 3854.8 [P.O.:600; I.V.:3254.8] Out: 3135 [Urine:2225; Chest Tube:260] Intake/Output this shift: Total I/O In: 556.5 [P.O.:240; I.V.:316.5] Out: 595 [Urine:95; Chest Tube:500]  General appearance: alert and cooperative Heart: regular rate and rhythm, S1, S2 normal, no murmur, click, rub or gallop Lungs: diminished breath sounds bibasilar  Lab Results:  Basename 09/12/11 0355 09/11/11 0838  WBC 19.3* 14.1*  HGB 12.3 12.3  HCT 37.8 38.8  PLT 245 259   BMET:  Basename 09/12/11 0355 09/11/11 0838  NA 133* 138  K 4.4 4.0  CL 99 101  CO2 29 28  GLUCOSE 169* 107*  BUN 8 11  CREATININE 0.66 0.85  CALCIUM 8.5 9.9    PT/INR:  Basename 09/11/11 0838  LABPROT 14.3  INR 1.09   ABG    Component Value Date/Time   PHART 7.445* 09/11/2011 0838   HCO3 26.1* 09/11/2011 0838   TCO2 27.3 09/11/2011 0838   O2SAT 94.2 09/11/2011 0838   CBG (last 3)  No results found for this basename: GLUCAP:3 in the last 72 hours  Assessment/Plan: S/P Procedure(s) (LRB): VIDEO BRONCHOSCOPY (N/A) INSERTION PLEURAL DRAINAGE CATHETER (Right) PERICARDIAL WINDOW (N/A) Continue pericardial tube to suction today.  Drainage is low so may be able to remove in next day or two. Drain right  pleurex daily.  500 cc today.  Followup on pathology and cytology Mobilize, IS   LOS: 1 day    Rain Friedt K 09/12/2011

## 2011-09-12 NOTE — Progress Notes (Signed)
UR Completed.  Ashwika Freels Jane [phone] 09/12/2011  

## 2011-09-13 ENCOUNTER — Inpatient Hospital Stay (HOSPITAL_COMMUNITY): Payer: Medicaid Other

## 2011-09-13 LAB — BASIC METABOLIC PANEL
CO2: 30 mEq/L (ref 19–32)
Chloride: 98 mEq/L (ref 96–112)
GFR calc Af Amer: >90 mL/min (ref >90)
Potassium: 4.2 mEq/L (ref 3.5–5.1)
Sodium: 133 mEq/L — ABNORMAL LOW (ref 135–145)

## 2011-09-13 LAB — CBC
Platelets: 227 10*3/uL (ref 150–400)
RBC: 4.18 MIL/uL (ref 3.87–5.11)
RDW: 13.9 % (ref 11.5–15.5)
WBC: 18.5 10*3/uL — ABNORMAL HIGH (ref 4.0–10.5)

## 2011-09-13 MED ORDER — MORPHINE SULFATE (PF) 1 MG/ML IV SOLN
INTRAVENOUS | Status: AC
  Filled 2011-09-13: qty 25

## 2011-09-13 NOTE — Progress Notes (Signed)
   CARDIOTHORACIC SURGERY PROGRESS NOTE   R2 Days Post-Op Procedure(s) (LRB): VIDEO BRONCHOSCOPY (N/A) INSERTION PLEURAL DRAINAGE CATHETER (Right) PERICARDIAL WINDOW (N/A)  Subjective: Feels okay. Mild soreness.  Objective: Vital signs: BP Readings from Last 1 Encounters:  09/13/11 104/60   Pulse Readings from Last 1 Encounters:  09/13/11 109   Resp Readings from Last 1 Encounters:  09/13/11 15   Temp Readings from Last 1 Encounters:  09/13/11 97.9 F (36.6 C) Oral    Hemodynamics:    Physical Exam:  Rhythm:   sinus  Breath sounds: clear  Heart sounds:  RRR  Incisions:  Dressings dry  Abdomen:  soft  Extremities:  warm   Intake/Output from previous day: 02/14 0701 - 02/15 0700 In: 2554.4 [P.O.:1080; I.V.:1474.4] Out: 3935 [Urine:3055; Chest Tube:880] Intake/Output this shift: Total I/O In: 752.9 [P.O.:380; I.V.:372.9] Out: 730 [Urine:710; Chest Tube:20]  Lab Results:  Basename 09/13/11 0440 09/12/11 0355  WBC 18.5* 19.3*  HGB 11.9* 12.3  HCT 37.7 37.8  PLT 227 245   BMET:  Basename 09/13/11 0440 09/12/11 0355  NA 133* 133*  K 4.2 4.4  CL 98 99  CO2 30 29  GLUCOSE 112* 169*  BUN 5* 8  CREATININE 0.68 0.66  CALCIUM 8.5 8.5    CBG (last 3)  No results found for this basename: GLUCAP:3 in the last 72 hours ABG    Component Value Date/Time   PHART 7.445* 09/11/2011 0838   HCO3 26.1* 09/11/2011 0838   TCO2 27.3 09/11/2011 0838   O2SAT 94.2 09/11/2011 0838   CXR: Somewhat improved  Assessment/Plan: S/P Procedure(s) (LRB): VIDEO BRONCHOSCOPY (N/A) INSERTION PLEURAL DRAINAGE CATHETER (Right) PERICARDIAL WINDOW (N/A)  Stable POD2 Minimal output from pericardial tube, will d/c Mobilize Transfer 2000 Continue to drain PleurX daily  OWEN,CLARENCE H 09/13/2011 3:13 PM

## 2011-09-13 NOTE — Significant Event (Signed)
Pt transferred to 2020. VS stable prior and during the transfer. Traveled via wheelchair, with portable telemetry. Receiving RN at bedside. All belongings taken to new room. Per patient, states she will call family regarding the transfer. Jontue Crumpacker, Charity fundraiser.

## 2011-09-13 NOTE — Op Note (Signed)
Barbara Parks, HANEY                ACCOUNT NO.:  [id number]  MEDICAL RECORD NO.:  [id number]  LOCATION:  2305                         FACILITY:  [facility]  PHYSICIAN:  Evelene Croon, M.D.     DATE OF BIRTH:  1951/01/31  DATE OF PROCEDURE:  09/11/2011 DATE OF DISCHARGE:                              OPERATIVE REPORT   PREOPERATIVE DIAGNOSES:  Large pericardial effusion, large right pleural effusion, and possible right middle lobe bronchial obstruction noted by CT scan.  POSTOPERATIVE DIAGNOSES:  Large pericardial effusion, large right pleural effusion, and possible right middle lobe bronchial obstruction noted by CT scan.  PROCEDURES: 1. Flexible fiberoptic bronchoscopy. 2. Subxiphoid pericardial window. 3. Insertion of right PleurX catheter.  ATTENDING SURGEON:  Evelene Croon, MD  ASSISTANT:  Coral Ceo, PA-C  ANESTHESIA:  General endotracheal.  CLINICAL HISTORY:  This patient is a 61 year old prior smoker, who also had history of breast cancer diagnosed in 2005 and was treated with right mastectomy, postoperative chemotherapy, and tamoxifen.  The patient had been diagnosed with an 11-mm right lower lobe pulmonary nodule about 1 year ago as well as some mild mediastinal adenopathy. She was seen by Dr. Sandrea Hughs at that time.  PET scan was recommended, but that was not performed because Dr. Sherene Sires did not feel it was indicated at that time.  She had no further followup until she recently presented with possible pneumonia requiring hospitalization briefly.  She did not have resolution of her cough and shortness of breath, and began having fluid retention in her legs and some pressure sensation in her chest, and subsequently had a CT scan of the chest done on September 06, 2011, which showed a large pericardial effusion that was relatively dense.  There was also a large right pleural effusion with extensive volume loss and consolidation of atelectasis in the right middle lobe, and  some atelectasis in the right lower lobe.  There was question of right middle lobe bronchial obstruction noted on the scan. There was also a peripheral pulmonary nodule noted in the right lower lobe that was increased in size to about 12 x 11 mm and a new 11 x 9 mm nodule slightly more superiorly in the right lower lobe adjacent to the major fissure.  After review of these findings and examination, the patient was felt that the above procedure was indicated for relief of her symptoms as well as for diagnosis of possible metastatic cancer.  I discussed the operative procedure with the patient and her friend.  We discussed alternatives, benefits, and risks including, but not limited to bleeding, blood transfusion, infection, injury to the heart and lungs, and she understood and agreed to proceed.  OPERATIVE PROCEDURE:  The patient was seen in the preoperative holding area and proper patient, proper operative side, proper operation were confirmed with the patient and the chart after reviewing her scans again.  The right side of the chest was signed by me.  The patient was taken back to the operating room, placed on the table supine position. After induction of general endotracheal anesthesia, Foley catheter was placed in bladder using sterile technique.  Lower extremity pneumatic compression devices were used.  Then, a  roll was placed longitudinally under the right side of the back to slightly raised right side of the chest.  First, we performed flexible fiberoptic bronchoscopy.  The distal trachea appeared normal.  The carina was sharp.  The left bronchial tree had normal segmental anatomy and no evidence of extrinsic compression or endobronchial lesions.  At the crux was advanced down the right mainstem bronchus, it was noted that the membranous portion of the right mainstem bronchus was displaced forward by some extrinsic compression.  The right upper lobe had normal segmental anatomy  and there were no endobronchial lesions seen.  Beginning in the bronchus intermedius, there appeared to be thickening and friability and irregularity of the bronchial mucosa.  This extended down into the beginning of the lower lobe bronchus.  The right middle lobe bronchial orifice was markedly compressed and I had difficulty getting into it. There were no endobronchial lesions seen, but again the bronchial mucosa appeared very friable and indurated.  Then I took several biopsies of the bronchus intermedius as well as the right middle lobe and this was followed by brushings of the right middle lobe.  There was complete hemostasis.  The bronchoscope was then withdrawn from the patient.  Then the chest and abdomen were prepped with Betadine soap and solution, draped in usual sterile manner.  We proceeded with subxiphoid pericardial window.  A vertical midline incision was made centered over the xiphoid process and carried down through subcutaneous tissues with electrocautery.  The midline fascia was divided to expose the subxiphoid space.  Dissection was continued down to the pericardium, which appeared somewhat thickened.  The pericardium was opened anteriorly and there was immediate release of 700 mL of frankly bloody pericardial fluid under some pressure.  This was completely evacuated.  A 3-cm round piece of the anterior pericardium was excised using electrocautery and this was sent to Pathology for examination.  Then a 36-French right-angled chest tube was brought through a separate stab incision and positioned in the posterior pericardium.  The midline fascia was then reapproximated with interrupted #1 Vicryl sutures.  The subcutaneous tissue was closed with continuous 2-0 Vicryl and the skin with 3-0 Vicryl subcuticular closure. Sponge, needle, and instrument counts were correct according to the scrub nurse at this point.  Then attention was turned to the right PleurX catheter.  A  small stab incision was made in the midaxillary line at approximately the eight intercostal space.  The right pleural space was then entered using the needle and catheter, and there was immediate return of yellow serous fluid.  Guidewire was advanced into the right pleural space and the catheter and needle were removed.  Then the PleurX catheter was cut to the appropriate length.  Another stab incision was made around the anterior axillary line about 10 cm anterior to the chest entry site. The PleurX catheter was then tunneled from the skin exit site to the chest entry site, so that the cuff was lined just inside the skin entry site.  Then, over the guidewire the introducer sheath was inserted.  The guidewire and introducer were removed and the PleurX catheter inserted through the peel-away sheath and it was removed.  The catheter was then sutured to the skin with a 3-0 nylon suture.  It was connected to vacuum bottle suction and 700 mL of yellow serous fluid was removed.  This was sent for cytologic examination as was the pericardial fluid.  Then the chest entry site was closed with a single 3-0 Vicryl  subcuticular skin stitch, and Dermabond was placed over this incision.  Sponge, needle, and instrument counts were again correct according to the scrub nurse. Sterile dressing was placed around the PleurX catheter and it was coiled beneath the dressing.  The patient tolerated the procedure well and was then awakened, extubated, and transferred to the post anesthesia care unit in a satisfactory and stable condition.     Evelene Croon, M.D.     BB/MEDQ  D:  09/12/2011  T:  09/13/2011  Job:  [id number]

## 2011-09-13 NOTE — Significant Event (Signed)
New Morphine sygrine hung in PCA, per policy. Wasted about 3.5cc morphine left over in sink and flushed. Waste with Tillie Fantasia, RN. Ayodele Sangalang, Charity fundraiser.

## 2011-09-13 NOTE — Progress Notes (Signed)
Pt arrived to 2020 and settled into room and call bell within reach. No complaints from pt. Will continue to monitor.

## 2011-09-14 MED ORDER — ALPRAZOLAM 0.5 MG PO TABS: 0.5000 mg | ORAL_TABLET | Freq: Three times a day (TID) | ORAL | Status: DC | PRN

## 2011-09-14 MED ORDER — OXYCODONE-ACETAMINOPHEN 5-325 MG PO TABS
2.0000 | ORAL_TABLET | ORAL | Status: DC | PRN
  Administered 2011-09-14: 2 via ORAL
  Administered 2011-09-15: 1 via ORAL
  Administered 2011-09-15: 2 via ORAL
  Filled 2011-09-14 (×2): qty 2
  Filled 2011-09-14: qty 1

## 2011-09-14 NOTE — Progress Notes (Signed)
Pt reported that she recently received the pathology report per MD and would like to speak to a chaplain. Chaplain on-call paged and will speak with pt in room. Emotional support given. Harlow Asa

## 2011-09-14 NOTE — Progress Notes (Signed)
Drained right pleurx catheter per order and per protocol. Pt experienced pain with end of procedure. Still on PCA, but will discontinue one hour after po prn pain med given. 250cc serosanguinous fluid returned. Pt with slight dry cough after procedure. Will continue to monitor. Harlow Asa

## 2011-09-14 NOTE — Progress Notes (Signed)
Chaplain Note:  Chaplain responded to page from pt nurse.  Chaplain introduced Barbara Parks to pt, who invited chaplain into room.  Pt was sitting up in a recliner next to bed.  Pt stated that she had just received a difficult diagnosis and wished to pray and to talk.  Chaplain provided spiritual comfort, support, scripture, and prayer for pt.  Pt expressed appreciation for chaplain support.  Chaplain will follow up if requested.   09/14/11 1500  Clinical Encounter Type  Visited With Patient  Visit Type Spiritual support;Initial  Referral From Nurse  Spiritual Encounters  Spiritual Needs Emotional;Prayer  Stress Factors  Patient Stress Factors Health changes;Major life changes  Family Stress Factors Major life changes     Verdie Shire, chaplain resident [phone]

## 2011-09-14 NOTE — Progress Notes (Addendum)
[address]            [place] [address]          [phone]     3 Days Post-Op Procedure(s) (LRB): VIDEO BRONCHOSCOPY (N/A) INSERTION PLEURAL DRAINAGE CATHETER (Right) PERICARDIAL WINDOW (N/A)  Subjective: Breathing stable.  Some cough, clear sputum. Still sore and using PCA.  Objective: Vital signs in last 24 hours: Patient Vitals for the past 24 hrs:  BP Temp Temp src Pulse Resp SpO2 Height Weight  09/14/11 0439 102/62 mmHg 98.4 F (36.9 C) Oral 113  17  95 % - -  09/13/11 2045 110/70 mmHg 98 F (36.7 C) Oral 118  18  93 % - -  09/13/11 1812 118/78 mmHg 98.8 F (37.1 C) Oral 120  18  94 % 5\' 5"  (1.651 m) 97.4 kg (214 lb 11.7 oz)  09/13/11 1754 - - - - 16  96 % - -  09/13/11 1700 114/62 mmHg - - 115  12  94 % - -  09/13/11 1600 119/73 mmHg - - 115  19  95 % - -  09/13/11 1549 - 98.1 F (36.7 C) Oral - - - - -  09/13/11 1537 - - - - 16  94 % - -  09/13/11 1500 114/65 mmHg - - 112  21  95 % - -  09/13/11 1400 106/57 mmHg - - 117  19  92 % - -  09/13/11 1300 104/60 mmHg - - 109  15  93 % - -  09/13/11 1220 - 97.9 F (36.6 C) Oral - - - - -  09/13/11 1200 107/76 mmHg - - 107  13  93 % - -  09/13/11 1159 - - - - 12  96 % - -  09/13/11 1110 105/64 mmHg - - 109  10  96 % - -  09/13/11 1056 - - - 110  14  95 % - -   Current Weight  09/13/11 97.4 kg (214 lb 11.7 oz)     Intake/Output from previous day: 02/15 0701 - 02/16 0700 In: 1190.4 [P.O.:700; I.V.:490.4] Out: 1900 [Urine:1860; Chest Tube:40]    PHYSICAL EXAM:  Heart: tachy 110s Lungs: crackle on R Wound: clean and dry   Lab Results: CBC: Basename 09/13/11 0440 09/12/11 0355  WBC 18.5* 19.3*  HGB 11.9* 12.3  HCT 37.7 37.8  PLT 227 245   BMET:  Basename 09/13/11 0440 09/12/11 0355  NA 133* 133*  K 4.2 4.4  CL 98 99  CO2 30 29  GLUCOSE 112* 169*  BUN 5* 8  CREATININE 0.68 0.66  CALCIUM 8.5 8.5    PT/INR: No results found for this basename: LABPROT,INR in  the last 72 hours  Path- bronchial/pericardial bx + metastatic breast Ca Pericardial and pleural fluid- atypical cells, suspicious for malignancy  Assessment/Plan: S/P Procedure(s) (LRB): VIDEO BRONCHOSCOPY (N/A) INSERTION PLEURAL DRAINAGE CATHETER (Right) PERICARDIAL WINDOW (N/A) Malignant pleural/pericardial effusions- will continue to drain PleuRx daily.   Followed by Dr. Donnie Coffin for h/o breast ca.  Will probably need to have oncology follow-up. Watch HR. May need to add low dose BB if BP allows. Leukocytosis, no fever.  Monitor.  WBC trending down.   LOS: 3 days    COLLINS,GINA H 09/14/2011   I have seen and examined the patient and agree with the assessment and plan as outlined.  I  discussed path results with Ms Dyches.  Will ask Dr Donnie Coffin to see her to discuss treatment options.  Rease Swinson H 09/14/2011 1:14 PM

## 2011-09-15 LAB — CBC
MCH: 28.7 pg (ref 26.0–34.0)
MCHC: 32.7 g/dL (ref 30.0–36.0)
MCV: 87.8 fL (ref 78.0–100.0)
Platelets: 291 10*3/uL (ref 150–400)
RBC: 4.35 MIL/uL (ref 3.87–5.11)
RDW: 13.9 % (ref 11.5–15.5)

## 2011-09-15 MED ORDER — OXYCODONE HCL 5 MG PO TABS: 5.0000 mg | ORAL_TABLET | ORAL | Status: AC | PRN

## 2011-09-15 MED ORDER — METOPROLOL TARTRATE 12.5 MG HALF TABLET
12.5000 mg | ORAL_TABLET | Freq: Two times a day (BID) | ORAL | Status: DC
  Administered 2011-09-15 (×2): 12.5 mg via ORAL
  Filled 2011-09-15 (×4): qty 1

## 2011-09-15 MED ORDER — METOPROLOL TARTRATE 12.5 MG HALF TABLET: 12.5000 mg | ORAL_TABLET | Freq: Two times a day (BID) | ORAL | Status: DC

## 2011-09-15 NOTE — Progress Notes (Signed)
Drained right pleurx catheter per order and protocol. Pre medicated prior to draining catheter. Pt. Seemed to tolerate better today as opposed to yesterday.  Obtained 250 cc serosanguinous fluid. Will continue to monitor patient. Linea Calles, Chrystine Oiler 09/15/2011

## 2011-09-15 NOTE — Discharge Summary (Addendum)
[address]            [name] [address]          [phone]         Discharge Summary  Name: Barbara Parks DOB: 1951/03/08 61 y.o. MRN: [id number]  Admission Date: 09/11/2011 Discharge Date:    Admitting Diagnosis: Large pericardial effusion Large right pleural effusion  Discharge Diagnosis:  Large metastatic pericardial effusion Large metastatic right pleural effusion History of right breast cancer, status post right mastectomy, chemotherapy, and tamoxifen therapy Arthritis Prior history of tobacco use   Procedures: Procedure(s): VIDEO BRONCHOSCOPY INSERTION RIGHT PLEURx  DRAINAGE CATHETER SUBXIPHOID PERICARDIAL WINDOW on 09/11/2011   HPI:  The patient is a 61 y.o. female with a history of breast cancer diagnosed in 2005 and was treated with right mastectomy, postoperative chemotherapy, and tamoxifen. The patient had been diagnosed with an 11-mm right lower lobe pulmonary nodule about 1 year ago as well as some mild mediastinal adenopathy. She was seen by Dr. Sandrea Hughs at that time. PET scan was recommended, but that was not performed because Dr. Sherene Sires did not feel it was indicated at that time. She had no further followup until she recently presented with possible pneumonia requiring hospitalization briefly. She did not have resolution of her cough and shortness of breath, and began having fluid retention in her legs and some pressure sensation in her chest, and subsequently had a CT scan of the chest done on September 06, 2011, which showed a large pericardial effusion that was relatively dense. There was also a large right pleural effusion with extensive volume loss and consolidation of atelectasis in the right middle lobe, and some atelectasis in the right lower lobe. There was question of right middle lobe bronchial obstruction noted on the scan. There was also a peripheral pulmonary nodule noted in the right lower lobe that was  increased in size to about 12 x 11 mm and a new 11 x 9 mm  nodule slightly more superiorly in the right lower lobe adjacent to the  major fissure. She was referred to Dr. Evelene Croon for surgical evaluation.  After review of these findings and examination, the  patient was felt that the above procedure was indicated for relief of  her symptoms as well as for diagnosis of possible metastatic cancer. All risks, benefits and alternatives of surgery were explained in detail, and the patient agreed to proceed.     Hospital Course:  The patient was admitted to [facility] on 09/11/2011. The patient was taken to the operating room and underwent the above procedure.    The postoperative course has been notable for tachycardia. Her heart rate is in the 120s and she's been started on low-dose of Lopressor. Her Pleurx catheter has been drained daily and is putting out around 250 mL's of serous fluid. Both her bronchial and pericardial biopsies were consistent with metastatic breast cancer, and pericardial and  pleural fluid both showed atypical cells suspicious for malignancy. Oncology has been consulted for discussion of treatment options. She continues to require one to 2 L of supplemental oxygen and we are monitoring for the need for home O2. We anticipate discharge home within the next 24-48 hours provided she remains stable.   Recent vital signs:  Filed Vitals:   09/15/11 0422  BP: 118/75  Pulse: 120  Temp: 98.8 F (37.1 C)  Resp: 19    Recent laboratory studies:  CBC: Basename 09/15/11 0525 09/13/11 0440  WBC 14.8* 18.5*  HGB 12.5 11.9*  HCT 38.2 37.7  PLT 291 227   BMET:  Basename 09/13/11 0440  NA 133*  K 4.2  CL 98  CO2 30  GLUCOSE 112*  BUN 5*  CREATININE 0.68  CALCIUM 8.5    PT/INR: No results found for this basename: LABPROT,INR in the last 72 hours  Discharge Medications:   Medication List  As of 09/17/2011  8:05 AM   TAKE these medications         albuterol 108 (90  BASE) MCG/ACT inhaler   Commonly known as: PROVENTIL HFA;VENTOLIN HFA   Inhale 2 puffs into the lungs every 6 (six) hours as needed. For shortness of breath      metoprolol tartrate 25 MG tablet   Commonly known as: LOPRESSOR   Take 1 tablet (25 mg total) by mouth 2 (two) times daily.      omeprazole 20 MG capsule   Commonly known as: PRILOSEC   Take 20 mg by mouth daily.      oxyCODONE 5 MG immediate release tablet   Commonly known as: Oxy IR/ROXICODONE   Take 1-2 tablets (5-10 mg total) by mouth every 4 (four) hours as needed for pain.           Discharge Instructions:  The patient is to refrain from driving, heavy lifting or strenuous activity.  May shower daily and clean incisions with soap and water.  May resume regular diet. PleuRx catheter will be drained daily by [facility].    Follow-up Information    Follow up with Pierce Crane, MD on 09/19/2011. (12:45 pm)    Contact information:   [address] [place] [address] [phone]       Follow up with Alleen Borne, MD. (Office will call you with an appointment)    Contact information:   [address] [place] [address] [phone]          Adella Hare 09/15/2011, 12:07 PM   Addendum- patient has appointment with Dr Pierce Crane on Thursday Feb 21st at 12:45pm

## 2011-09-15 NOTE — Progress Notes (Signed)
PCA morphine 1mg /ml syringe removed from PCA with 5 mg in syringe. Morphine wasted with RN witness, [name] RN. Barbara Parks, Barbara Parks

## 2011-09-15 NOTE — Progress Notes (Signed)
Morphine PCA 1mg /ml taken out of pump...was already off. 5mg  left in syringe, wasted in sharps box. Witnessed by [employer]. Harlow Asa

## 2011-09-15 NOTE — Progress Notes (Addendum)
                    [address]            [place] [address]          [phone]     4 Days Post-Op Procedure(s) (LRB): VIDEO BRONCHOSCOPY (N/A) INSERTION PLEURAL DRAINAGE CATHETER (Right) PERICARDIAL WINDOW (N/A)  Subjective: SOB with exertion, some nonproductive cough.  Pain controlled.  Objective: Vital signs in last 24 hours: Patient Vitals for the past 24 hrs:  BP Temp Temp src Pulse Resp SpO2  09/15/11 0422 118/75 mmHg 98.8 F (37.1 C) Oral 120  19  95 %  09/14/11 2011 127/79 mmHg 97.6 F (36.4 C) Oral 127  19  93 %  09/14/11 1334 118/68 mmHg 97.3 F (36.3 C) Oral 115  18  97 %   Current Weight  09/13/11 97.4 kg (214 lb 11.7 oz)     Intake/Output from previous day: 02/16 0701 - 02/17 0700 In: 720 [P.O.:720] Out: 550 [Urine:300; Chest Tube:250]    PHYSICAL EXAM:  Heart: RRR, tachy 110-120s Lungs: crackles, exp wheezes on R Wound: clean and dry   Lab Results: CBC: Basename 09/15/11 0525 09/13/11 0440  WBC 14.8* 18.5*  HGB 12.5 11.9*  HCT 38.2 37.7  PLT 291 227   BMET:  Basename 09/13/11 0440  NA 133*  K 4.2  CL 98  CO2 30  GLUCOSE 112*  BUN 5*  CREATININE 0.68  CALCIUM 8.5    PT/INR: No results found for this basename: LABPROT,INR in the last 72 hours   Assessment/Plan: S/P Procedure(s) (LRB): VIDEO BRONCHOSCOPY (N/A) INSERTION PLEURAL DRAINAGE CATHETER (Right) PERICARDIAL WINDOW (N/A) PleuRx output 250 cc yesterday.  Continue daily drainage. Tachycardic- start low dose BB Pulm- wean O2 as tolerated.  May need home O2. Leukocytosis- WBC down.  Monitor. Oncology-spoke with Dr. Darrold Span.  She requests that we contact Dr. Donnie Coffin on Monday for input on further treatment. ?home 1-2 days if remains stable.   LOS: 4 days    COLLINS,GINA H 09/15/2011   I have seen and examined the patient and agree with the assessment and plan as outlined.  Probably can go home tomorrow or Tuesday once arrangements in place for home  PleurX management and follow up arranged with Dr Lynford Citizen H 09/15/2011 2:05 PM

## 2011-09-15 NOTE — Discharge Instructions (Signed)
Clean incisions daily with soap and water. Resume regular diet. Increase activity as tolerated. No heavy lifting. Home health nurse will be arranged to assist with drainage of Pleurx catheter.

## 2011-09-16 MED ORDER — METOPROLOL TARTRATE 25 MG PO TABS
25.0000 mg | ORAL_TABLET | Freq: Two times a day (BID) | ORAL | Status: DC
  Administered 2011-09-16 – 2011-09-17 (×3): 25 mg via ORAL
  Filled 2011-09-16 (×4): qty 1

## 2011-09-16 NOTE — Progress Notes (Signed)
Pleurex catheter drained 150 cc's of sangiouness fluid after premedicating with 5 mg of oxycodone.  Patient tolerated fairly well, requested remaining dose of oyxcodone.  Would recommend premedicating with 10 mg prior to next drainage. Baird Lyons 2:13 PM

## 2011-09-16 NOTE — Progress Notes (Signed)
Patient's resting O2 Sat's, after > one hour on room air remained 94-5%.  Patient ambulated 'the circle' on room air and sat's continued at 95-96%.  Patient complained of feeling SOB with activity. Baird Lyons 4:59 PM

## 2011-09-16 NOTE — Progress Notes (Signed)
   CARE MANAGEMENT NOTE 09/16/2011  Patient:  Barbara Parks, Barbara Parks   Account Number:  [id number]  Date Initiated:  09/12/2011  Documentation initiated by:  [facility]  Subjective/Objective Assessment:   bronch, pericardial window,Pleurix.  Alone - has friends and neighbors.     Action/Plan:   PTA, PT INDEPENDENT, LIVES ALONE.   Anticipated DC Date:  09/17/2011   Anticipated DC Plan:  HOME W HOME HEALTH SERVICES      DC Planning Services  CM consult      [facility] Choice  HOME HEALTH   Choice offered to / List presented to:  C-1 Patient        HH arranged  HH-1 RN      [facility] agency  [facility]   Status of service:  In process, will continue to follow Medicare Important Message given?   (If response is "NO", the following Medicare IM given date fields will be blank) Date Medicare IM given:   Date Additional Medicare IM given:    Discharge Disposition:  HOME W HOME HEALTH SERVICES  Per UR Regulation:  Reviewed for med. necessity/level of care/duration of stay  Comments:  09/16/11 Barbara Speights,RN,BSN 1235 PT STATES FAMILY IS COMING IN FROM OUT OF STATE TO ASSIST AT HOME(WILL BE LATER IN THE WEEK.) REFERRAL TO [facility] FOR HHRN.  WILL HAVE BEDSIDE RN CHECK RA SAT, AS PT IS LIKELY TO NEED HOME O2 AT DC.  WILL COMPLETE PLEURX DRAINAGE KIT ORDER FORMS AND FAX TO [facility] PER PROTOCOL. Phone #[phone]   09/13/11 Barbara Alviar,RN,BSN 1545 MET WITH PT TO DISCUSS DC PLANS.  PT LIVES ALONE, BUT PLANS TO DC HOME WITH NEIGHBOR.  NEIGHBOR IS GOING OUT OF TOWN ON WED EVENING, BUT PT STATES FAMILY IS COMING IN FROM [place] LATER IN THE WEEK.  PT WILL NEED HHRN FOR PLEURX DRAINAGE, POSSIBLE HOME O2 AT DC.  WILL FOLLOW UP ON MONDAY. Phone #[phone]

## 2011-09-16 NOTE — Progress Notes (Signed)
                   [address]            [place] [address]          [phone]     5 Days Post-Op  Procedure(s) (LRB): VIDEO BRONCHOSCOPY (N/A) INSERTION PLEURAL DRAINAGE CATHETER (Right) PERICARDIAL WINDOW (N/A) Subjective: Appears comfortable on O2 at 1 liter  Objective  Telemetry sinus tach  Temp:  [97.3 F (36.3 C)-97.7 F (36.5 C)] 97.6 F (36.4 C) (02/18 0428) Pulse Rate:  [105-111] 105  (02/18 0428) Resp:  [18-19] 19  (02/18 0428) BP: (110-131)/(75-81) 110/75 mmHg (02/18 0428) SpO2:  [95 %-98 %] 95 % (02/18 0428)   Intake/Output Summary (Last 24 hours) at 09/16/11 0804 Last data filed at 09/15/11 1757  Gross per 24 hour  Intake    480 ml  Output      0 ml  Net    480 ml       General appearance: alert, cooperative and no distress Heart: regular rate and rhythm Lungs: clear to auscultation bilaterally Abdomen: soft, non-tender; bowel sounds normal; no masses,  no organomegaly Wound: incision healing well  Lab Results: No results found for this basename: NA:2,K:2,CL:2,CO2:2,GLUCOSE:2,BUN:2,CREATININE:2,CALCIUM:2,MG:2,PHOS:2 in the last 72 hours No results found for this basename: AST:2,ALT:2,ALKPHOS:2,BILITOT:2,PROT:2,ALBUMIN:2 in the last 72 hours No results found for this basename: LIPASE:2,AMYLASE:2 in the last 72 hours  Basename 09/15/11 0525  WBC 14.8*  NEUTROABS --  HGB 12.5  HCT 38.2  MCV 87.8  PLT 291   No results found for this basename: CKTOTAL:4,CKMB:4,TROPONINI:4 in the last 72 hours No components found with this basename: POCBNP:3 No results found for this basename: DDIMER in the last 72 hours No results found for this basename: HGBA1C in the last 72 hours No results found for this basename: CHOL,HDL,LDLCALC,TRIG,CHOLHDL in the last 72 hours No results found for this basename: TSH,T4TOTAL,FREET3,T3FREE,THYROIDAB in the last 72 hours No results found for this basename: VITAMINB12,FOLATE,FERRITIN,TIBC,IRON,RETICCTPCT in  the last 72 hours  Medications: Scheduled    . metoprolol tartrate  12.5 mg Oral BID  . pantoprazole  40 mg Oral Q1200     Radiology/Studies:  No results found.  INR: Will add last result for INR, ABG once components are confirmed Will add last 4 CBG results once components are confirmed  Assessment/Plan: S/P Procedure(s) (LRB): VIDEO BRONCHOSCOPY (N/A) INSERTION PLEURAL DRAINAGE CATHETER (Right) PERICARDIAL WINDOW (N/A) 1. Appears to  need home O2 2. Leukocytosis improved  3. Will need to contact Dr Donnie Coffin for f/u 4. Increase beta blocker 5. Home when all arrangements made   LOS: 5 days    Amparo Donalson E 2/18/20138:04 AM

## 2011-09-17 MED ORDER — METOPROLOL TARTRATE 25 MG PO TABS: 25.0000 mg | ORAL_TABLET | Freq: Two times a day (BID) | ORAL | Status: DC

## 2011-09-17 NOTE — Progress Notes (Signed)
Pleurex catheter drained 100 cc's of sangiouness fluid after premedicating with 10 mg of oxycodone. Pt tolerated procedure well. Will continue to monitor.  Martyn Malay, RN

## 2011-09-17 NOTE — Progress Notes (Signed)
                    [address]            [place] [address]          [phone]     6 Days Post-Op Procedure(s) (LRB): VIDEO BRONCHOSCOPY (N/A) INSERTION PLEURAL DRAINAGE CATHETER (Right) PERICARDIAL WINDOW (N/A)  Subjective: Breathing stable, no complaints this am. Sats stable off O2.  Objective: Vital signs in last 24 hours: Patient Vitals for the past 24 hrs:  BP Temp Temp src Pulse Resp SpO2  09/17/11 0451 102/71 mmHg 98.2 F (36.8 C) Oral 94  18  94 %  09/16/11 2052 123/86 mmHg 97.2 F (36.2 C) Oral 109  18  96 %  09/16/11 1329 103/71 mmHg 98.1 F (36.7 C) Oral 98  18  98 %   Current Weight  09/13/11 97.4 kg (214 lb 11.7 oz)     Intake/Output from previous day: 02/18 0701 - 02/19 0700 In: 600 [P.O.:600] Out: 300 [Urine:300]    PHYSICAL EXAM:  Heart: RRR Lungs: crackles in R base Wound: clean and dry PleuRx drainage= 150 ml yesterday  Lab Results: CBC: Basename 09/15/11 0525  WBC 14.8*  HGB 12.5  HCT 38.2  PLT 291   BMET: No results found for this basename: NA:2,K:2,CL:2,CO2:2,GLUCOSE:2,BUN:2,CREATININE:2,CALCIUM:2 in the last 72 hours  PT/INR: No results found for this basename: LABPROT,INR in the last 72 hours   Assessment/Plan: S/P Procedure(s) (LRB): VIDEO BRONCHOSCOPY (N/A) INSERTION PLEURAL DRAINAGE CATHETER (Right) PERICARDIAL WINDOW (N/A) PleuRx drainage stable, decreasing. Continue daily drainage for now. Sats stable off O2.  It does not appear she will need home O2.' HR improved after increase in bb. Plan discharge today.   LOS: 6 days    Liliahna Cudd H 09/17/2011

## 2011-09-17 NOTE — Progress Notes (Signed)
Pt. Discharged 09/17/2011  1:28 PM Discharge instructions reviewed with patient/family. Patient/family verbalized understanding. All Rx's given. Questions answered as needed. Pt. Discharged to home with family/self. Taken off unit via W/C. Barbara Parks

## 2011-09-20 NOTE — Progress Notes (Signed)
   CARE MANAGEMENT NOTE 09/20/2011  Patient:  Barbara Parks, Barbara Parks   Account Number:  [id number]  Date Initiated:  09/12/2011  Documentation initiated by:  [facility] : [facility]  Subjective/Objective Assessment:   bronch, pericardial window,Pleurix.  Alone - has friends and neighbors.     Action/Plan:   PTA, PT INDEPENDENT, LIVES ALONE.   Anticipated DC Date:  09/17/2011   Anticipated DC Plan:  HOME W HOME HEALTH SERVICES      DC Planning Services  CM consult      [facility] Choice  HOME HEALTH   Choice offered to / List presented to:  C-1 Patient   DME arranged  OXYGEN      DME agency  [facility]     [place] Day arranged  HH-1 RN      [facility] agency  [facility]   Status of service:  Completed, signed off Medicare Important Message given?   (If response is "NO", the following Medicare IM given date fields will be blank) Date Medicare IM given:   Date Additional Medicare IM given:    Discharge Disposition:  HOME W HOME HEALTH SERVICES  Per UR Regulation:  Reviewed for med. necessity/level of care/duration of stay  Comments:  09/17/11 Lacresha Fusilier,RN,BSN PT FOR DISCHARGE HOME TODAY.  SATS STABLE OFF OXYGEN, DOES NOT APPEAR SHE WILL NEED HOME O2.  .  [facility] NURSE TO FOLLOW UP WITH PT ON 09/18/11.  09/16/11 Budd Freiermuth,RN,BSN 1235 PT STATES FAMILY IS COMING IN FROM OUT OF STATE TO ASSIST AT HOME(WILL BE LATER IN THE WEEK.) REFERRAL TO [facility] FOR HHRN.  WILL HAVE BEDSIDE RN CHECK RA SAT, AS PT IS LIKELY TO NEED HOME O2 AT DC.  WILL COMPLETE PLEURX DRAINAGE KIT ORDER FORMS AND FAX TO [facility] PER PROTOCOL.  09/13/11 Shanette Tamargo,RN,BSN 1545 MET WITH PT TO DISCUSS DC PLANS.  PT LIVES ALONE, BUT PLANS TO DC HOME WITH NEIGHBOR.  NEIGHBOR IS GOING OUT OF TOWN ON WED EVENING, BUT PT STATES FAMILY IS COMING IN FROM [place] LATER IN THE WEEK.  PT WILL NEED HHRN FOR PLEURX DRAINAGE, POSSIBLE HOME O2 AT DC.  WILL FOLLOW UP ON MONDAY.

## 2011-09-23 ENCOUNTER — Other Ambulatory Visit: Payer: Self-pay | Admitting: *Deleted

## 2011-09-23 ENCOUNTER — Ambulatory Visit (HOSPITAL_BASED_OUTPATIENT_CLINIC_OR_DEPARTMENT_OTHER): Payer: No Typology Code available for payment source

## 2011-09-23 ENCOUNTER — Other Ambulatory Visit: Payer: Self-pay | Admitting: Oncology

## 2011-09-23 ENCOUNTER — Ambulatory Visit (HOSPITAL_BASED_OUTPATIENT_CLINIC_OR_DEPARTMENT_OTHER): Payer: No Typology Code available for payment source | Admitting: Oncology

## 2011-09-23 VITALS — BP 125/71 | HR 101 | Temp 98.8°F | Ht 65.0 in | Wt 193.7 lb

## 2011-09-23 DIAGNOSIS — C801 Malignant (primary) neoplasm, unspecified: Secondary | ICD-10-CM

## 2011-09-23 DIAGNOSIS — C50919 Malignant neoplasm of unspecified site of unspecified female breast: Secondary | ICD-10-CM

## 2011-09-23 LAB — CBC WITH DIFFERENTIAL/PLATELET
BASO%: 0.1 % (ref 0.0–2.0)
EOS%: 3 % (ref 0.0–7.0)
HCT: 43.6 % (ref 34.8–46.6)
MCH: 28 pg (ref 25.1–34.0)
MCHC: 32.6 g/dL (ref 31.5–36.0)
NEUT%: 71.9 % (ref 38.4–76.8)
RBC: 5.07 10*6/uL (ref 3.70–5.45)
WBC: 10.8 10*3/uL — ABNORMAL HIGH (ref 3.9–10.3)
lymph#: 1.8 10*3/uL (ref 0.9–3.3)

## 2011-09-23 LAB — COMPREHENSIVE METABOLIC PANEL
ALT: 32 U/L (ref 0–35)
AST: 26 U/L (ref 0–37)
Albumin: 3.4 g/dL — ABNORMAL LOW (ref 3.5–5.2)
Alkaline Phosphatase: 106 U/L (ref 39–117)
BUN: 8 mg/dL (ref 6–23)
Creatinine, Ser: 1.05 mg/dL (ref 0.50–1.10)
Potassium: 4.1 mEq/L (ref 3.5–5.3)

## 2011-09-23 LAB — LACTATE DEHYDROGENASE: LDH: 180 U/L (ref 94–250)

## 2011-09-23 NOTE — Progress Notes (Signed)
Referral MD Dr Lupita Leash gates  Reason for Referral: Recurrent breast cancer    No chief complaint on file. : This is a 61 year old woman who I previously followed for her earliest stage breast cancer in 2005. She was last seen in our office in 2006. Because of difficulties with insurance she has not had a recent mammogram. She doesn't have shortness of breath on exertion going back a year. She was seen by the pulmonary service and a CT scan showed some minor mediastinal adenopathy as well as a borderline low nodule. At this time the PET scan was not performed. At positive weeks ago the patient began to experience more shortness of breath. She was initially told she had pneumonia. When she started developing lower extremity edema she had a CT scan of the chest which enlarged pleural effusion on the right side as well as a pericardial effusion. She was has been referred to the hospital and seen by the cardiothoracic surgeons who placed Pleurx cath on the right side as well as performing a pericardial window. This was performed on 09/11/2011. The fluid that was hormone receptor positive HER-2 was not obtained as it was insufficient tissue and the findings were consistent with mammary primary. A CT scan performed did show 2 of nodules that were suspicious for metastatic cancer as well as collapse   Past Medical History  Diagnosis Date  . Asthma   . Shortness of breath   . Cancer 2005    breast/chemo R mastectomy, treated with tamoxifen for ~4 yrs  . Headache     OTC Prilosec  . Arthritis     left hip  . Contact dermatitis     from a bandaid that had Latex   :  Past Surgical History  Procedure Date  . Spine surgery 1996  . Breast surgery 2005    right  . Video bronchoscopy 09/11/2011    Procedure: VIDEO BRONCHOSCOPY;  Surgeon: Alleen Borne, MD;  Location: [facility] OR;  Service: Thoracic;  Laterality: N/A;  . Chest tube insertion 09/11/2011    Procedure: INSERTION PLEURAL DRAINAGE CATHETER;  Surgeon:  Alleen Borne, MD;  Location: [facility] OR;  Service: Thoracic;  Laterality: Right;  . Pericardial window 09/11/2011    Procedure: PERICARDIAL WINDOW;  Surgeon: Alleen Borne, MD;  Location: [facility] OR;  Service: Thoracic;  Laterality: N/A;  :  Current outpatient prescriptions:albuterol (PROVENTIL HFA;VENTOLIN HFA) 108 (90 BASE) MCG/ACT inhaler, Inhale 2 puffs into the lungs every 6 (six) hours as needed. For shortness of breath, Disp: , Rfl: ;  metoprolol tartrate (LOPRESSOR) 25 MG tablet, Take 12.5 mg by mouth 2 (two) times daily., Disp: , Rfl: ;  omeprazole (PRILOSEC) 20 MG capsule, Take 20 mg by mouth daily., Disp: , Rfl:  oxyCODONE (OXY IR/ROXICODONE) 5 MG immediate release tablet, Take 1-2 tablets (5-10 mg total) by mouth every 4 (four) hours as needed for pain., Disp: 40 tablet, Rfl: 0:    :  Allergies  Allergen Reactions  . Aspirin     REACTION: upset stomach  . Latex Other (See Comments)    Blistering and skin peels off  . Nsaids Nausea And Vomiting    Extreme nausea and vomiting  :  Family History  Problem Relation Age of Onset  . Cancer Mother, @ age 92, 2 maternal aunts with breast cancer; 1 cousin with breast cancer age51, sister age 35, recently diagnoswed with breast cancer./ apparently cousin has had genetics which were negative,.     breast  .  Heart disease Father   . Anesthesia problems Neg Hx   :  History   Social History  . Marital Status: Single    Spouse Name: N/A    Number of Children: 0  . Years of Education: N/A   Occupational History  . Not on file.   Social History Main Topics  . Smoking status: Former Smoker    Quit date: 09/10/2007  . Smokeless tobacco: Not on file  . Alcohol Use: No  . Drug Use: No  . Sexually Active: Not on file   Other Topics Concern  . Not on file   Social History Narrative  . 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  Exam:  @[facility]  General appearance: alert, cooperative and appears stated age Throat: lips, mucosa, and tongue normal; teeth and gums normal Resp: Decreased energy right base, increased dullness to percussion right base. Left lung relatively clear. Chest wall: no tenderness, Pleurx catheter exit site right lower base. Breasts: normal appearance, no masses or tenderness, Right chest wall exam unremarkable left breast is diffusely fibrocystic no discrete masses. Both axilla negative. Cardio: regular rate and rhythm, S1, S2 normal, no murmur, click, rub or gallop and normal apical impulse GI: soft, non-tender; bowel sounds normal; no masses,  no organomegaly Extremities: extremities normal, atraumatic, no cyanosis or edema Skin: Skin color, texture, turgor normal. No rashes or lesions Lymph nodes: Cervical, supraclavicular, and axillary nodes normal. Neurologic: Grossly normal   Basename 09/23/11 1658  WBC 10.8*  HGB 14.2  HCT 43.6  PLT 414*    Basename 09/23/11 1658  NA 133*  K 4.1  CL 96  CO2 28  GLUCOSE 109*  BUN 8  CREATININE 1.05  CALCIUM 10.8*    Blood smear review: n/a  Pathology:as above  Dg Chest 2 View  09/11/2011  *RADIOLOGY REPORT*  Clinical Data: preop surgery  CHEST - 2 VIEW  Comparison: 09/06/2011  Findings: Heart size is normal.  There is a moderate-sized right pleural effusion.  Atelectasis within the right base is identified and appears unchanged from previous study.  The left lung is clear.  IMPRESSION:  1.  No change in the right pleural effusion and right base atelectasis.  Original Report Authenticated By: Rosealee Albee, M.D.   Dg Chest 2 View  08/26/2011  *RADIOLOGY REPORT*  Clinical Data: 61 year old female with cough and shortness of breath.  CHEST - 2 VIEW  Comparison: 08/08/2010  Findings: New airspace disease in the right lower lung is identified likely representing pneumonia. Upper limits normal heart size again identified.  There may be a tiny right pleural effusion present. There is no evidence of pneumothorax or left pleural effusion. Right mastectomy and axillary surgery again noted.  IMPRESSION: New right lower lung airspace disease likely representing pneumonia.  Radiographic follow up to resolution is recommended to exclude other processes. Question tiny right pleural effusion.  Original Report Authenticated By: Rosendo Gros, M.D.   Ct Chest Wo Contrast  09/06/2011  *RADIOLOGY REPORT*  Clinical Data: Shortness of breath.  Recent pneumonia.  786.05.  CT CHEST WITHOUT CONTRAST  Technique:  Multidetector CT imaging of the chest was performed following the standard protocol without IV contrast.  Comparison: Chest x-ray dated 08/25/1998 02/13 and chest CT dated 08/09/2010  Findings: The patient has a large pericardial effusion which is relatively dense.  This could represent hemorrhage or  a proteinaceous effusion.  There is an enlarged peripheral pulmonary nodule in the right lower lobe.  This was present on the prior study of 08/09/2010 but is now 12 x 11 x 11 millimeters.  There is a new 11 x 9 x 5 mm nodule slightly more superiorly in the right lower lobe adjacent to the major fissure.  There is extensive volume loss and consolidation and atelectasis in the right middle lobe.  There is narrowing of the right middle lobe bronchus centrally with abnormal soft tissue in the inferior aspect of the right hilum which may represent infiltrating tumor.  The patient has calcified lymph nodes in the subcarinal and pre carinal region which are essentially unchanged since the prior exam.  Large right pleural effusion. The heart size is normal.  No acute osseous abnormality.  Visualized portion of the upper abdomen is normal. Left lung is clear. The patient has had a right mastectomy. No adenopathy in the axilla.  IMPRESSION: 1.  Two nodules in the right lower lobe worrisome for malignancy. Abnormal soft tissue density in the inferior aspect  of the right hilum narrowing the right lower lobe and right middle lobe bronchi worrisome for tumor. 2.  Large pericardial effusion.  I suspect this is a malignant pericardial effusion. 3.  Large right pleural effusion.  Report called to Dr. Gerri Spore by Dr. Jena Gauss on the 09/06/2011.  Original Report Authenticated By: Gwynn Burly, M.D.   Dg Chest Port 1 View  09/13/2011  *RADIOLOGY REPORT*  Clinical Data: Right pleurex catheter placed, pericardial window  PORTABLE CHEST - 1 VIEW  Comparison: Portable chest x-ray of 09/12/2011  Findings: Aeration of the lung bases has improved.  A right pleurex catheter is present.  There is basilar atelectasis remaining.  A left chest tube is present.  A venous sheath remains in the left innominate vein.  No pneumothorax is seen.  Cardiomegaly is stable.  IMPRESSION: Somewhat improved aeration.  Pleurex catheter remains in the right lung base.  Original Report Authenticated By: Juline Patch, M.D.   Dg Chest Port 1 View  09/12/2011  *RADIOLOGY REPORT*  Clinical Data: History of video bronchoscopy with insertion of pleural drainage catheter and pericardial window.  PORTABLE CHEST - 1 VIEW  Comparison: 09/11/2011  Findings: Stable position of the chest drains.  There is no evidence for a pneumothorax.  Stable hazy densities at the right lung base could represent areas of volume loss and residual pleural fluid/thickening.  There is a left jugular central venous catheter still in place.  The trachea is midline.  Heart size is grossly stable.  IMPRESSION: Stable position of the chest drains.  No evidence for a large pneumothorax.  Persistent pleural and parenchymal densities at the right lung base.  Original Report Authenticated By: Richarda Overlie, M.D.   Dg Chest Port 1 View  09/11/2011  *RADIOLOGY REPORT*  Clinical Data: Postop.  PORTABLE CHEST - 1 VIEW  Comparison: 09/11/2011 and CT chest 09/06/2011.  Findings: Trachea is midline. Cardiopericardial silhouette is slightly  more prominent after pericardial window procedure.  Left IJ catheter sheath is in place. Right chest tube is in place. Bibasilar air space disease with interval decrease in size of right pleural effusion.  No pneumothorax.  IMPRESSION:  1.  Interval pericardial window with drain in place. 2.  Decrease in size of right pleural effusion with right chest tube in place.  No pneumothorax. 3.  Bibasilar air space disease.  Original Report Authenticated By: Reyes Ivan, M.D.  Assessment and Plan:  Pleasant 21-year-old woman with history of ER positive breast cancer diagnosed 1995 status post mastectomy, status post tamoxifen therapy. She now returns with cytology and biopsy proven hormone receptor positive breast cancer involving pleura on the right side, and pericardium. She is here with his sister in followup. We spent time today discussing the natural history of breast cancer. I explained the significance of having stage IV disease. We also discussed potential therapies which would include hormonal therapy primarily and also chemotherapy. We discussed using a aromatase inhibitor therapy in an attempt to keep her fluid control accumulation under control. We'll also does use the possible enrollment in a study comparing hormonal therapy with hormonal therapy benefits were. Staging scans including PET and MRI the brain. Will have flu he have our research nurses be in touch with the patient and her staging scans are done hopefully get her on therapy fairly soon.    70 minutes was spent with this patient, at that time and patient-related counseling  Pierce Crane M.D. [facility]

## 2011-09-24 ENCOUNTER — Telehealth: Payer: Self-pay | Admitting: *Deleted

## 2011-09-24 ENCOUNTER — Other Ambulatory Visit: Payer: Self-pay | Admitting: *Deleted

## 2011-09-24 ENCOUNTER — Ambulatory Visit: Payer: Self-pay | Admitting: Oncology

## 2011-09-24 DIAGNOSIS — R05 Cough: Secondary | ICD-10-CM

## 2011-09-24 DIAGNOSIS — R059 Cough, unspecified: Secondary | ICD-10-CM

## 2011-09-24 MED ORDER — HYDROCOD POLST-CHLORPHEN POLST 10-8 MG/5ML PO LQCR: 5.0000 mL | Freq: Two times a day (BID) | ORAL | Status: DC

## 2011-09-24 NOTE — Telephone Encounter (Signed)
[facility] called to say Barbara Parks had a dry cough.  She had seen Dr. Donnie Coffin and c/o same , but was referred to [place] for management. Tussionex syrup was ordered.

## 2011-09-24 NOTE — Telephone Encounter (Signed)
gave patient appointment for mri brain and pet scan 09-2011 printed out calendar and gave to the patient

## 2011-09-25 ENCOUNTER — Encounter: Payer: Self-pay | Admitting: *Deleted

## 2011-09-25 ENCOUNTER — Other Ambulatory Visit: Payer: Self-pay | Admitting: Thoracic Surgery (Cardiothoracic Vascular Surgery)

## 2011-09-25 DIAGNOSIS — I309 Acute pericarditis, unspecified: Secondary | ICD-10-CM

## 2011-09-30 ENCOUNTER — Ambulatory Visit
Admission: RE | Admit: 2011-09-30 | Discharge: 2011-09-30 | Disposition: A | Discharge status: Home / Self Care | Payer: No Typology Code available for payment source | Source: Ambulatory Visit | Attending: Thoracic Surgery (Cardiothoracic Vascular Surgery) | Admitting: Thoracic Surgery (Cardiothoracic Vascular Surgery)

## 2011-09-30 ENCOUNTER — Ambulatory Visit (INDEPENDENT_AMBULATORY_CARE_PROVIDER_SITE_OTHER): Payer: Self-pay | Admitting: Surgical

## 2011-09-30 VITALS — BP 115/75 | HR 96 | Resp 18 | Ht 65.0 in | Wt 193.0 lb

## 2011-09-30 DIAGNOSIS — J90 Pleural effusion, not elsewhere classified: Secondary | ICD-10-CM

## 2011-09-30 DIAGNOSIS — Z9889 Other specified postprocedural states: Secondary | ICD-10-CM

## 2011-09-30 DIAGNOSIS — I313 Pericardial effusion (noninflammatory): Secondary | ICD-10-CM

## 2011-09-30 DIAGNOSIS — I319 Disease of pericardium, unspecified: Secondary | ICD-10-CM

## 2011-09-30 DIAGNOSIS — Z0279 Encounter for issue of other medical certificate: Secondary | ICD-10-CM

## 2011-09-30 DIAGNOSIS — I309 Acute pericarditis, unspecified: Secondary | ICD-10-CM

## 2011-09-30 NOTE — Patient Instructions (Signed)
Have home nurse drain Pleurx catheter twice weekly and record results.

## 2011-09-30 NOTE — Progress Notes (Signed)
  HPI: Patient returns for routine postoperative follow-up having undergone subxiphoid pericardial window and placement of a right Pleurx catheter on 09/11/2011. The patient's early postoperative recovery while in the hospital was notable for a diagnosis of malignant effusion secondary to breast carcinoma. Since hospital discharge the patient reports she has followed up with Dr. Pierce Crane and they are planning oncology followup including a new trial for therapy. Currently the drainage scheduled for the Pleurx is every other day and they have been getting less than 100 cc. She does feel symptomatic relief after the drainage.   Current Outpatient Prescriptions  Medication Sig Dispense Refill  . albuterol (PROVENTIL HFA;VENTOLIN HFA) 108 (90 BASE) MCG/ACT inhaler Inhale 2 puffs into the lungs every 6 (six) hours as needed. For shortness of breath      . chlorpheniramine-HYDROcodone (TUSSIONEX PENNKINETIC ER) 10-8 MG/5ML LQCR Take 5 mLs by mouth every 12 (twelve) hours.  140 mL  0  . metoprolol tartrate (LOPRESSOR) 25 MG tablet Take 12.5 mg by mouth 1 day or 1 dose.       Marland Kitchen omeprazole (PRILOSEC) 20 MG capsule Take 20 mg by mouth daily.      Marland Kitchen oxyCODONE-acetaminophen (PERCOCET) 5-325 MG per tablet Take 1 tablet by mouth every 6 (six) hours as needed.        Physical Exam: Physical Exam  Constitutional: She appears well-developed and well-nourished. No distress.  Cardiovascular: Normal rate, regular rhythm and normal heart sounds.  Exam reveals no friction rub.   No murmur heard. Pulmonary/Chest: Breath sounds normal.  Abdominal: Soft.  incisions well healed without signs of infection  Diagnostic Tests: Chest x-ray was obtained on today's date and shows no significant effusions or infiltrates. There is no evidence of congestive failure.  Impression: Malignant breast cancer status post subxiphoid pericardial window and placement of right Pleurx catheter.  Plan: Change the drainage scheduled  to twice weekly at this point and followup in 3 weeks with a chest x-ray.

## 2011-10-01 ENCOUNTER — Other Ambulatory Visit: Payer: Self-pay | Admitting: *Deleted

## 2011-10-01 DIAGNOSIS — C801 Malignant (primary) neoplasm, unspecified: Secondary | ICD-10-CM

## 2011-10-03 ENCOUNTER — Other Ambulatory Visit (HOSPITAL_COMMUNITY): Payer: Self-pay

## 2011-10-03 ENCOUNTER — Ambulatory Visit (HOSPITAL_COMMUNITY)
Admission: RE | Admit: 2011-10-03 | Discharge: 2011-10-03 | Disposition: A | Discharge status: Home / Self Care | Payer: Medicaid Other | Source: Ambulatory Visit | Attending: Oncology | Admitting: Oncology

## 2011-10-03 DIAGNOSIS — C50919 Malignant neoplasm of unspecified site of unspecified female breast: Secondary | ICD-10-CM | POA: Insufficient documentation

## 2011-10-03 DIAGNOSIS — G9389 Other specified disorders of brain: Secondary | ICD-10-CM | POA: Insufficient documentation

## 2011-10-03 MED ORDER — TECHNETIUM TC 99M MEDRONATE IV KIT
25.0000 | PACK | Freq: Once | INTRAVENOUS | Status: AC | PRN
  Administered 2011-10-03: 25 via INTRAVENOUS

## 2011-10-03 MED ORDER — GADOBENATE DIMEGLUMINE 529 MG/ML IV SOLN
17.0000 mL | Freq: Once | INTRAVENOUS | Status: AC | PRN
  Administered 2011-10-03: 17 mL via INTRAVENOUS

## 2011-10-03 MED ORDER — IOHEXOL 300 MG/ML  SOLN
100.0000 mL | Freq: Once | INTRAMUSCULAR | Status: AC | PRN
  Administered 2011-10-03: 100 mL via INTRAVENOUS

## 2011-10-07 ENCOUNTER — Other Ambulatory Visit (HOSPITAL_BASED_OUTPATIENT_CLINIC_OR_DEPARTMENT_OTHER): Payer: No Typology Code available for payment source | Admitting: Lab

## 2011-10-07 ENCOUNTER — Ambulatory Visit (HOSPITAL_BASED_OUTPATIENT_CLINIC_OR_DEPARTMENT_OTHER): Payer: Self-pay | Admitting: Oncology

## 2011-10-07 ENCOUNTER — Other Ambulatory Visit: Payer: Self-pay

## 2011-10-07 ENCOUNTER — Encounter: Payer: Self-pay | Admitting: *Deleted

## 2011-10-07 VITALS — BP 118/74 | HR 96 | Temp 98.2°F | Ht 65.0 in | Wt 192.4 lb

## 2011-10-07 DIAGNOSIS — J910 Malignant pleural effusion: Secondary | ICD-10-CM

## 2011-10-07 DIAGNOSIS — C801 Malignant (primary) neoplasm, unspecified: Secondary | ICD-10-CM

## 2011-10-07 DIAGNOSIS — C50919 Malignant neoplasm of unspecified site of unspecified female breast: Secondary | ICD-10-CM

## 2011-10-07 DIAGNOSIS — N390 Urinary tract infection, site not specified: Secondary | ICD-10-CM

## 2011-10-07 LAB — URINALYSIS, MICROSCOPIC - CHCC
Bilirubin (Urine): NEGATIVE
Glucose: NEGATIVE g/dL
Ketones: NEGATIVE mg/dL
Leukocyte Esterase: NEGATIVE
Nitrite: NEGATIVE
Protein: NEGATIVE mg/dL
Specific Gravity, Urine: 1.025 (ref 1.003–1.035)
pH: 5 (ref 4.6–8.0)

## 2011-10-07 LAB — CBC WITH DIFFERENTIAL/PLATELET
Basophils Absolute: 0 10*3/uL (ref 0.0–0.1)
Eosinophils Absolute: 0.2 10*3/uL (ref 0.0–0.5)
HGB: 14.5 g/dL (ref 11.6–15.9)
MCV: 85.4 fL (ref 79.5–101.0)
MONO#: 0.6 10*3/uL (ref 0.1–0.9)
MONO%: 6.4 % (ref 0.0–14.0)
NEUT#: 8.1 10*3/uL — ABNORMAL HIGH (ref 1.5–6.5)
RBC: 5.12 10*6/uL (ref 3.70–5.45)
RDW: 13.9 % (ref 11.2–14.5)
WBC: 10.1 10*3/uL (ref 3.9–10.3)
lymph#: 1.1 10*3/uL (ref 0.9–3.3)

## 2011-10-07 LAB — COMPREHENSIVE METABOLIC PANEL
Albumin: 3.6 g/dL (ref 3.5–5.2)
Alkaline Phosphatase: 107 U/L (ref 39–117)
BUN: 18 mg/dL (ref 6–23)
Calcium: 10.2 mg/dL (ref 8.4–10.5)
Chloride: 100 mEq/L (ref 96–112)
Glucose, Bld: 100 mg/dL — ABNORMAL HIGH (ref 70–99)
Potassium: 4.5 mEq/L (ref 3.5–5.3)
Sodium: 136 mEq/L (ref 135–145)
Total Protein: 7.6 g/dL (ref 6.0–8.3)

## 2011-10-07 LAB — PHOSPHORUS: Phosphorus: 3.5 mg/dL (ref 2.3–4.6)

## 2011-10-07 LAB — PROTIME-INR
INR: 1 — ABNORMAL LOW (ref 2.00–3.50)
Protime: 12 s (ref 10.6–13.4)

## 2011-10-07 LAB — CK: Total CK: 34 U/L (ref 7–177)

## 2011-10-07 LAB — URIC ACID: Uric Acid, Serum: 5.4 mg/dL (ref 2.4–7.0)

## 2011-10-07 LAB — BILIRUBIN, DIRECT: Bilirubin, Direct: 0.1 mg/dL (ref 0.0–0.3)

## 2011-10-07 LAB — APTT: aPTT: 33 s (ref 24–37)

## 2011-10-07 LAB — LIPID PANEL: LDL Cholesterol: 113 mg/dL — ABNORMAL HIGH (ref 0–99)

## 2011-10-08 ENCOUNTER — Other Ambulatory Visit: Payer: Self-pay | Admitting: *Deleted

## 2011-10-08 ENCOUNTER — Encounter: Payer: Self-pay | Admitting: Oncology

## 2011-10-08 ENCOUNTER — Telehealth: Payer: Self-pay | Admitting: Oncology

## 2011-10-08 DIAGNOSIS — C801 Malignant (primary) neoplasm, unspecified: Secondary | ICD-10-CM

## 2011-10-08 NOTE — Telephone Encounter (Signed)
Spoke with patient she will be coming by to see Barbara Parks counselor one day this week to go over financial application process.

## 2011-10-08 NOTE — Progress Notes (Signed)
Referral MD Dr Lupita Leash gates  Reason for Referral: Recurrent breast cancer    No chief complaint on file. : This is a 61 year old woman who I previously followed for her early  stage breast cancer in 2005. She was last seen in our office in 2006. Because of difficulties with insurance she has not had a recent mammogram. She  Has  shortness of breath on exertion going back a year. She was seen by the pulmonary service and a CT scan showed some minor mediastinal adenopathy as well as a borderline lower lobe nodule. At this time the PET scan was not performed. 6  weeks ago the patient began to experience more shortness of breath. She was initially told she had pneumonia. When she started developing lower extremity edema she had a CT scan of the chest which enlarged pleural effusion on the right side as well as a pericardial effusion. She was has been referred to the hospital and seen by the cardiothoracic surgeons who placed Pleurx cath on the right side as well as performing a pericardial window. This was performed on 09/11/2011. The fluid that was hormone receptor positive HER-2 was not obtained as it was insufficient tissue and the findings were consistent with mammary primary. A CT scan performed did show 2 of nodules that were suspicious for metastatic cancer as well as lt lower lobe collapse   Past Medical History  Diagnosis Date  . Asthma   . Shortness of breath   . Cancer 2005    breast/chemo R mastectomy, treated with tamoxifen for ~4 yrs  . Headache     OTC Prilosec  . Arthritis     left hip  . Contact dermatitis     from a bandaid that had Latex   :  Past Surgical History  Procedure Date  . Spine surgery 1996  . Breast surgery 2005    right  . Video bronchoscopy 09/11/2011    Procedure: VIDEO BRONCHOSCOPY;  Surgeon: Alleen Borne, MD;  Location[facility] OR;  Service: Thoracic;  Laterality: N/A;  . Chest tube insertion 09/11/2011    Procedure: INSERTION PLEURAL DRAINAGE CATHETER;   Surgeon: Alleen Borne, MD;  Location: [facility] OR;  Service: Thoracic;  Laterality: Right;  . Pericardial window 09/11/2011    Procedure: PERICARDIAL WINDOW;  Surgeon: Alleen Borne, MD;  Location: [facility] OR;  Service: Thoracic;  Laterality: N/A;  :  Current outpatient prescriptions:albuterol (PROVENTIL HFA;VENTOLIN HFA) 108 (90 BASE) MCG/ACT inhaler, Inhale 2 puffs into the lungs every 6 (six) hours as needed. For shortness of breath, Disp: , Rfl: ;  metoprolol tartrate (LOPRESSOR) 25 MG tablet, Take 12.5 mg by mouth 2 (two) times daily., Disp: , Rfl: ;  omeprazole (PRILOSEC) 20 MG capsule, Take 20 mg by mouth daily., Disp: , Rfl:  oxyCODONE (OXY IR/ROXICODONE) 5 MG immediate release tablet, Take 1-2 tablets (5-10 mg total) by mouth every 4 (four) hours as needed for pain., Disp: 40 tablet, Rfl: 0:    :  Allergies  Allergen Reactions  . Aspirin     REACTION: upset stomach  . Latex Other (See Comments)    Blistering and skin peels off  . Nsaids Nausea And Vomiting    Extreme nausea and vomiting  :  Family History  Problem Relation Age of Onset  . Cancer Mother, @ age 56, 2 maternal aunts with breast cancer; 1 cousin with breast cancer age51, sister age 44, recently diagnoswed with breast cancer./ apparently cousin has had genetics which were  negative,.     breast  . Heart disease Father   . Anesthesia problems Neg Hx   :  History   Social History  . Marital Status: Single    Spouse Name: N/A    Number of Children: 0  . Years of Education: N/A   Occupational History  . Not on file.   Social History Main Topics  . Smoking status: Former Smoker    Quit date: 09/10/2007  . Smokeless tobacco: Not on file  . Alcohol Use: No  . Drug Use: No  . Sexually Active: Not on file   Other Topics Concern  . Not on file   Social History Narrative  . 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 Her activities  are somewhat limited by this .   Exam:  bp 118/74, p-96; T-98.2;  ecog 1 General appearance: alert, cooperative and appears stated age Throat: lips, mucosa, and tongue normal; teeth and gums normal Resp: Decreased energy right base,rt lung- clear,  Left lung relatively clear. Chest wall: no tenderness, Pleurx catheter exit site right lower base. Breasts: normal appearance, no masses or tenderness, Right chest wall exam unremarkable left breast is diffusely fibrocystic no discrete masses. Both axilla negative. Cardio: regular rate and rhythm, S1, S2 normal, no murmur, click, rub or gallop and normal apical impulse GI: soft, non-tender; bowel sounds normal; no masses,  no organomegaly Extremities: extremities normal, atraumatic, no cyanosis or edema Skin: Skin color, texture, turgor normal. No rashes or lesions Lymph nodes: Cervical, supraclavicular, and axillary nodes normal. Neurologic: Grossly normal   Basename 10/07/11 0906  WBC 10.1  HGB 14.5  HCT 43.8  PLT 283    Basename 10/07/11 0906  NA 136  K 4.5  CL 100  CO2 29  GLUCOSE 100*  BUN 18  CREATININE 0.93  CALCIUM 10.2   EKG- no clinically significant changes Blood smear review: n/a  Pathology:as above  Dg Chest 2 View  09/11/2011  *RADIOLOGY REPORT*  Clinical Data: preop surgery  CHEST - 2 VIEW  Comparison: 09/06/2011  Findings: Heart size is normal.  There is a moderate-sized right pleural effusion.  Atelectasis within the right base is identified and appears unchanged from previous study.  The left lung is clear.  IMPRESSION:  1.  No change in the right pleural effusion and right base atelectasis.  Original Report Authenticated By: Rosealee Albee, M.D.   Dg Chest 2 View  08/26/2011  *RADIOLOGY REPORT*  Clinical Data: 61 year old female with cough and shortness of breath.  CHEST - 2 VIEW   Comparison: 08/08/2010  Findings: New airspace disease in the right lower lung is identified likely representing pneumonia. Upper limits normal heart size again identified. There may be a tiny right pleural effusion present. There is no evidence of pneumothorax or left pleural effusion. Right mastectomy and axillary surgery again noted.  IMPRESSION: New right lower lung airspace disease likely representing pneumonia.  Radiographic follow up to resolution is recommended to exclude other processes. Question tiny right pleural effusion.  Original Report Authenticated By: Rosendo Gros, M.D.   Ct Chest Wo Contrast  09/06/2011  *RADIOLOGY REPORT*  Clinical Data: Shortness of breath.  Recent pneumonia.  786.05.  CT CHEST WITHOUT CONTRAST  Technique:  Multidetector CT imaging of the chest was  performed following the standard protocol without IV contrast.  Comparison: Chest x-ray dated 08/25/1998 02/13 and chest CT dated 08/09/2010  Findings: The patient has a large pericardial effusion which is relatively dense.  This could represent hemorrhage or a proteinaceous effusion.  There is an enlarged peripheral pulmonary nodule in the right lower lobe.  This was present on the prior study of 08/09/2010 but is now 12 x 11 x 11 millimeters.  There is a new 11 x 9 x 5 mm nodule slightly more superiorly in the right lower lobe adjacent to the major fissure.  There is extensive volume loss and consolidation and atelectasis in the right middle lobe.  There is narrowing of the right middle lobe bronchus centrally with abnormal soft tissue in the inferior aspect of the right hilum which may represent infiltrating tumor.  The patient has calcified lymph nodes in the subcarinal and pre carinal region which are essentially unchanged since the prior exam.  Large right pleural effusion. The heart size is normal.  No acute osseous abnormality.  Visualized portion of the upper abdomen is normal. Left lung is clear. The patient has had a  right mastectomy. No adenopathy in the axilla.  IMPRESSION: 1.  Two nodules in the right lower lobe worrisome for malignancy. Abnormal soft tissue density in the inferior aspect of the right hilum narrowing the right lower lobe and right middle lobe bronchi worrisome for tumor. 2.  Large pericardial effusion.  I suspect this is a malignant pericardial effusion. 3.  Large right pleural effusion.  Report called to Dr. Gerri Spore by Dr. Jena Gauss on the 09/06/2011.  Original Report Authenticated By: Gwynn Burly, M.D.   Dg Chest Port 1 View  09/13/2011  *RADIOLOGY REPORT*  Clinical Data: Right pleurex catheter placed, pericardial window  PORTABLE CHEST - 1 VIEW  Comparison: Portable chest x-ray of 09/12/2011  Findings: Aeration of the lung bases has improved.  A right pleurex catheter is present.  There is basilar atelectasis remaining.  A left chest tube is present.  A venous sheath remains in the left innominate vein.  No pneumothorax is seen.  Cardiomegaly is stable.  IMPRESSION: Somewhat improved aeration.  Pleurex catheter remains in the right lung base.  Original Report Authenticated By: Juline Patch, M.D.   Dg Chest Port 1 View  09/12/2011  *RADIOLOGY REPORT*  Clinical Data: History of video bronchoscopy with insertion of pleural drainage catheter and pericardial window.  PORTABLE CHEST - 1 VIEW  Comparison: 09/11/2011  Findings: Stable position of the chest drains.  There is no evidence for a pneumothorax.  Stable hazy densities at the right lung base could represent areas of volume loss and residual pleural fluid/thickening.  There is a left jugular central venous catheter still in place.  The trachea is midline.  Heart size is grossly stable.  IMPRESSION: Stable position of the chest drains.  No evidence for a large pneumothorax.  Persistent pleural and parenchymal densities at the right lung base.  Original Report Authenticated By: Richarda Overlie, M.D.   Dg Chest Port 1 View  09/11/2011  *RADIOLOGY  REPORT*  Clinical Data: Postop.  PORTABLE CHEST - 1 VIEW  Comparison: 09/11/2011 and CT chest 09/06/2011.  Findings: Trachea is midline. Cardiopericardial silhouette is slightly more prominent after pericardial window procedure.  Left IJ catheter sheath is in place. Right chest tube is in place. Bibasilar air space disease with interval decrease in size of right pleural effusion.  No pneumothorax.  IMPRESSION:  1.  Interval  pericardial window with drain in place. 2.  Decrease in size of right pleural effusion with right chest tube in place.  No pneumothorax. 3.  Bibasilar air space disease.  Original Report Authenticated By: Reyes Ivan, M.D.    Assessment and Plan:  Pleasant 78-year-old woman with history of ER positive breast cancer diagnosed 1995 status post mastectomy, status post tamoxifen therapy. She now returns with cytology and biopsy proven hormone receptor positive breast cancer involving pleura on the right side, and pericardium. She is doing much better. She continues to have some cough on exertion as well some dyspnea on exertion. I have checked her O2 saturation at rest and it is 98% and drops to 95-97% with exertion. We reviewed various her scan results. An MRI of the brain shows 2 tiny areas of enhancement measuring about 3-4 mm which are indeterminate. The other scans show nodules measuring 1.1 cm a right lower lobe 1.2 cm in the subcarinal area. The scans also showed resolved pericardial effusion borderline enlarged mediastinal right hilar lymph nodes. The bone scan shows some sclerosis about the 11th rib. She has agreed to enrollment in the[place]olero 4 study and she will receive Femara with affinitor.We discussed side effects of affinitor.   70 minutes was spent with this patient, at that time and patient-related counseling  Pierce Crane M.D. [facility]

## 2011-10-08 NOTE — Progress Notes (Signed)
Put short term and long term disability papers on nurse' s desk.

## 2011-10-09 ENCOUNTER — Other Ambulatory Visit: Payer: Self-pay | Admitting: *Deleted

## 2011-10-09 ENCOUNTER — Encounter: Payer: Self-pay | Admitting: Oncology

## 2011-10-09 ENCOUNTER — Ambulatory Visit: Payer: Self-pay | Admitting: Oncology

## 2011-10-09 DIAGNOSIS — C50919 Malignant neoplasm of unspecified site of unspecified female breast: Secondary | ICD-10-CM

## 2011-10-09 NOTE — Progress Notes (Signed)
Faxed disability papers to [employer] @ [phone] and [facility] @ [phone], put originals in the registration desk.

## 2011-10-10 ENCOUNTER — Ambulatory Visit (HOSPITAL_BASED_OUTPATIENT_CLINIC_OR_DEPARTMENT_OTHER): Payer: No Typology Code available for payment source

## 2011-10-10 ENCOUNTER — Encounter: Payer: Self-pay | Admitting: *Deleted

## 2011-10-10 ENCOUNTER — Telehealth: Payer: Self-pay | Admitting: *Deleted

## 2011-10-10 ENCOUNTER — Ambulatory Visit (HOSPITAL_BASED_OUTPATIENT_CLINIC_OR_DEPARTMENT_OTHER): Payer: No Typology Code available for payment source | Admitting: Physician Assistant

## 2011-10-10 ENCOUNTER — Other Ambulatory Visit: Payer: Self-pay | Admitting: *Deleted

## 2011-10-10 ENCOUNTER — Other Ambulatory Visit: Payer: Self-pay | Admitting: Oncology

## 2011-10-10 VITALS — BP 107/69 | HR 94 | Temp 98.0°F | Ht 65.0 in | Wt 192.2 lb

## 2011-10-10 DIAGNOSIS — R300 Dysuria: Secondary | ICD-10-CM

## 2011-10-10 DIAGNOSIS — C50911 Malignant neoplasm of unspecified site of right female breast: Secondary | ICD-10-CM | POA: Insufficient documentation

## 2011-10-10 DIAGNOSIS — C801 Malignant (primary) neoplasm, unspecified: Secondary | ICD-10-CM

## 2011-10-10 DIAGNOSIS — C50919 Malignant neoplasm of unspecified site of unspecified female breast: Secondary | ICD-10-CM

## 2011-10-10 DIAGNOSIS — Z006 Encounter for examination for normal comparison and control in clinical research program: Secondary | ICD-10-CM

## 2011-10-10 DIAGNOSIS — C7800 Secondary malignant neoplasm of unspecified lung: Secondary | ICD-10-CM

## 2011-10-10 DIAGNOSIS — N390 Urinary tract infection, site not specified: Secondary | ICD-10-CM

## 2011-10-10 LAB — URINALYSIS, MICROSCOPIC - CHCC
Glucose: NEGATIVE g/dL
Ketones: NEGATIVE mg/dL
Specific Gravity, Urine: 1.025 (ref 1.003–1.035)

## 2011-10-10 MED ORDER — LETROZOLE 2.5 MG PO TABS: 2.5000 mg | ORAL_TABLET | Freq: Every day | ORAL | Status: DC

## 2011-10-10 MED ORDER — INV-EVEROLIMUS (RAD0001) 5MG TABLET NOVARTIS CRAD001Y24135: 2.0000 | ORAL_TABLET | Freq: Every day | ORAL | Status: DC

## 2011-10-10 NOTE — Progress Notes (Signed)
Per CS, Rx for septra DS called to pt pharmacy [facility] in [place]. Have notified pt

## 2011-10-10 NOTE — Progress Notes (Signed)
Hematology and Oncology Follow Up Visit  Barbara Parks [id number] May 22, 1951 61 y.o. 10/10/2011    HPI: Barbara Parks is a 61 year old [name] with newly diagnosed metastatic ER positive breast carcinoma with involvement of the right pleura and pericardium. She is due to enroll in the [place] for study, for which she will receive letrozole 2.5 mg per day and Afinitor 10 mg per day. Due for day 1 today.  Interim History:   Barbara Parks is seen today prior to initiating day 1 of the BOLERO-4  Study. She continues to have a dry nonproductive cough felt to be secondary to her pulmonary involvement. Pleurx catheter is continuing to put out pleural fluid, she is having a change/drained every 4-5 days. She utilizes Tussionex for cough suppression, but admits is not all that effective. She denies any fevers, chills or night sweats. She denies any nausea, emesis, diarrhea or constipation issues at this point in time. She denies any baseline mouth sensitivity or soreness. She does describe some urinary frequency and hesitancy. She denies any frank suprapubic pressure. She denies any new diffuse bone pain.  A detailed review of systems is otherwise noncontributory as noted below.  Review of Systems: Constitutional:  no weight loss, fever, night sweats and fatigued Eyes: uses glasses ENT: no complaints Cardiovascular: no chest pain or dyspnea on exertion Respiratory: positive for - cough Neurological: no TIA or stroke symptoms Dermatological: negative Gastrointestinal: no abdominal pain, change in bowel habits, or black or bloody stools Genito-Urinary: positive for - dysuria and urinary frequency/urgency Hematological and Lymphatic: negative Breast: negative Musculoskeletal: negative Remaining ROS negative.    Medications:   I have reviewed the patient's current medications.  Current Outpatient Prescriptions  Medication Sig Dispense Refill  . sulfamethoxazole-trimethoprim  (BACTRIM DS) 800-160 MG per tablet Take 1 tablet by mouth 2 (two) times daily.      Marland Kitchen albuterol (PROVENTIL HFA;VENTOLIN HFA) 108 (90 BASE) MCG/ACT inhaler Inhale 2 puffs into the lungs every 6 (six) hours as needed. For shortness of breath      . chlorpheniramine-HYDROcodone (TUSSIONEX PENNKINETIC ER) 10-8 MG/5ML LQCR Take 5 mLs by mouth every 12 (twelve) hours.  140 mL  0  . Investigational everolimus (RAD001) 5 MG tablet Novartis [id number] Take 2 tablets by mouth daily. Take with a glass of water.  70 tablet  0  . letrozole (FEMARA) 2.5 MG tablet Take 1 tablet (2.5 mg total) by mouth daily.  30 tablet  0  . metoprolol tartrate (LOPRESSOR) 25 MG tablet Take 12.5 mg by mouth 1 day or 1 dose.       Marland Kitchen omeprazole (PRILOSEC) 20 MG capsule Take 20 mg by mouth daily.      Marland Kitchen oxyCODONE-acetaminophen (PERCOCET) 5-325 MG per tablet Take 1 tablet by mouth every 6 (six) hours as needed.        Allergies:  Allergies  Allergen Reactions  . Aspirin     REACTION: upset stomach  . Latex Other (See Comments)    Blistering and skin peels off  . Nsaids Nausea And Vomiting    Extreme nausea and vomiting    Physical Exam: Filed Vitals:   10/10/11 1004  BP: 107/69  Pulse: 94  Temp: 98 F (36.7 C)    Body mass index is 31.98 kg/(m^2). Weight: 192 lbs. HEENT:  Sclerae anicteric, conjunctivae pink.  Oropharynx clear.  No mucositis or candidiasis.   Nodes:  No cervical, supraclavicular, or axillary lymphadenopathy palpated.  Breast Exam: Deferred.  Lungs:  Clear to auscultation bilaterally.  No crackles, rhonchi, or wheezes.   Heart:  Regular rate and rhythm.   Abdomen:  Soft, nontender.  Positive bowel sounds.  No organomegaly or masses palpated.   Musculoskeletal:  No focal spinal tenderness to palpation.  Extremities:  Benign.  No peripheral edema or cyanosis.   Skin:  Benign.   Neuro:  Nonfocal, alert and oriented x 3.  ECOG: 0  Lab Results: Lab Results  Component Value Date   WBC 10.1  10/07/2011   HGB 14.5 10/07/2011   HCT 43.8 10/07/2011   MCV 85.4 10/07/2011   PLT 283 10/07/2011   NEUTROABS 8.1* 10/07/2011     Chemistry      Component Value Date/Time   NA 136 10/07/2011 0906   K 4.5 10/07/2011 0906   CL 100 10/07/2011 0906   CO2 29 10/07/2011 0906   BUN 18 10/07/2011 0906   CREATININE 0.93 10/07/2011 0906      Component Value Date/Time   CALCIUM 10.2 10/07/2011 0906   ALKPHOS 107 10/07/2011 0906   AST 20 10/07/2011 0906   ALT 20 10/07/2011 0906   BILITOT 0.4 10/07/2011 0906      Lab Results  Component Value Date   LABCA2 106* 09/23/2011  Results for Barbara Parks, Barbara Parks I (MRN [id number]) as of 10/10/2011 16:24  Ref. Range 10/10/2011 11:49  Glucose Latest Range: 70-99 mg/dL Negative  RBC Count Latest Range: 0-2  3-6  Specific Gravity, Urine Latest Range: 1.005-1.030  1.025  pH Latest Range: 4.6-8.0  6.0  Bilirubin (Urine) Latest Range: Negative  Negative  Protein Latest Range: NEGATIVE mg/dL < 30  Nitrite Latest Range: NEGATIVE  Negative  Leukocyte Esterase Latest Range: Negative  Moderate  Blood Latest Range: Negative  Moderate  WBC, UA Latest Range: 0-2  21-50  Mucus, UA Latest Range: Negative- Small  Moderate  Bacteria, UA Latest Range: Negative- Trace  Many  Epithelial Cells Latest Range: Negative- Few  Few     Assessment:  Barbara Parks is a 61 year old [name] with newly diagnosed metastatic ER positive breast carcinoma with involvement of the right pleura and pericardium. She is due to enroll in the [place] for study, for which she will receive letrozole 2.5 mg per day and Afinitor 10 mg per day. Due for day 1 today. 2. Urinary tract infection.   Case has been reviewed with Dr. Pierce Crane, who also spoke with patient.   Plan:  Tamelia will initiate Femara 2.5 mg by mouth daily in combination with Afinitor 10 mg per day per study today as scheduled. Given her evidence of a UTI, we will also initiate Septra DS one by mouth twice a day for 7  days. I will see her in 2 weeks and 4 week intervals with CBCs being obtained. She knows to watch for any mucositis type symptoms that should occur in to contact us expediently if this should arise. She'll also contact us if for any reason her cough should worsen.   This plan was reviewed with the patient, who voices understanding and agreement.  She knows to call with any changes or problems.    William Laske T, PA-C 10/10/2011

## 2011-10-10 NOTE — Telephone Encounter (Signed)
gave patient appointment for 10-23-2011 and 11-07-2011 printed out calendar and gave to the patient sent patient back to the lab for urine specmin

## 2011-10-10 NOTE — Progress Notes (Signed)
10/10/11 at 11:57am- Cycle 1, day 1 study notes- The pt was in the clinic this am for her cycle 1, day 1 on the [employer] [id number] study.  The pt had her fasting biomarkers labs drawn predose.  She had 3 sets of sitting vital signs taken.  She was seen by Debbora Presto, Dr. Renelda Loma PA, today for day 1 physical exam.  The pt denied any bladder issues on 10/07/11 at her screening visit.  However, it was noted that her urinalysis revealed large amount of blood.  Thayer Ohm asked the pt if she had any bladder symptoms.  The pt said that she developed some hesistancy of urine yesterday.  Thayer Ohm had the pt go to the lab again for a repeat urinalysis.  The research nurse informed the PA that if she had an UTI then we would need to check for any drug interactions.  Thayer Ohm said she would Cipro for a documented UTI.  Cipro is considered a "moderate" inhibitor of CYP3A.  Therefore, no dose modification will be necessary.  The pt was examined and denied any other new problems.  She still has her ongoing intermittent cough.  The pt then took her everolimus (10mg ) and her letrozole (2.5mg ) at the same time in the clinic this am at 11:30am.  The pt ate a lite snack before she took her pills.  She was reminded that she needs to eat a lite snack consistently before taking her pills every morning.  The pt was also instructed to record any missed doses on her calendar.  The research nurse reviewed her current concomitant medications and their start dates.  The pt said that she was diagnosed with asthma in December 2009.  She said she was prescribed albuterol at that time as well.  She stated she started prilosec in January 2012.  She also stated that she has been on percocet since February 2013.  She said that she also has a history of osteoarthritis in the left hip which was diagnosed in July 2008.  She said that she uses a cane for stability due to the hip.  Dr. Donnie Coffin spoke to the patient and he encouraged her to call for any problems.   The pt will be seen in 2 weeks.    10/10/11 at 14:27- The PA stated the pt's urine revealed an UTI.  Dr. Donnie Coffin prescribed Septra 1 pill bid for 7 days.  The research nurse called our pharmacist and asked if there were any potential interactions with everolimus and Septra.  He confirmed that Septra does not use the CYP3A pathway.  Therefore, the pt will be treated with Septra.  The pt had symptoms prior to her first dose of study drugs.    10/11/11 at 11:00am- The research nurse called the pt to check on her status.  She said that she is tolerating the study drugs well without any problems.  She confirmed that she began her Septra yesterday, 10/10/11.  She said that today she has developed some mild lower back pain which she associates with her confirmed UTI.  Rn explained to the pt that the research was entering her data in the database.  Rn said that I needed an actual end date to her tamoxifen.  She said that it was on 07/28/08.  She confirmed that she began 20 mg of tamoxifen on 10/12/04.  She said she stopped her tamoxifen because she had taken it for over 3 years and due to cost reasons.  The pt stated that she  was prescribed Prilosec for a diagnosis of reflux on 08/20/10 by Dr. Sherene Sires.  She states her spine surgery was in Sep. 1996.  She said she had 3 ruptured discs that had to be repaired.  She also said her Latex allergy was diagnosed in June 2003.

## 2011-10-11 ENCOUNTER — Encounter: Payer: Self-pay | Admitting: *Deleted

## 2011-10-12 LAB — URINE CULTURE

## 2011-10-15 ENCOUNTER — Encounter: Payer: Self-pay | Admitting: Oncology

## 2011-10-15 ENCOUNTER — Other Ambulatory Visit: Payer: Self-pay | Admitting: Surgery

## 2011-10-15 ENCOUNTER — Telehealth: Payer: Self-pay | Admitting: *Deleted

## 2011-10-15 DIAGNOSIS — J90 Pleural effusion, not elsewhere classified: Secondary | ICD-10-CM

## 2011-10-15 NOTE — Progress Notes (Signed)
Faxed clinical information to [facility] attn: Deitra Mayo @ [phone].

## 2011-10-15 NOTE — Telephone Encounter (Signed)
Called pt. And let her know that Dr. Donnie Coffin would like her to use a baking soda (1/2 to 1 tsp) and salt (1/8 tsp) mouth rinse in one cup warm water 3-4 times a day.  Followed by a plain water rinse.  She understands and will try this.

## 2011-10-15 NOTE — Telephone Encounter (Signed)
10/15/11- at 2:11pm- Rn received a call from the pt stating she developed a mouth ulcer (under her tongue) in the evening of 10/14/11.  She called the research nurse on 10/15/11 to report the "stomatitis".  She said from a pain standpoint, her ulcer is a "2" on a score of 1-10.  She said that the ulcer has not affected her eating or drinking.  She knew that according to her consent she was supposed to notify Dr. Donnie Coffin.  Unfortunately, our site does not have the treatment allocation cards to randomize the pt to the stomatitis sub-study.  The study team was contacted, and Bubba Hales instructed our site to "treat the pt who developed mouth ulcers per the standard of care at your site".  Research nurse spoke directly with Dr. Donnie Coffin and his nurse, William Hamburger about this patient situation.  Dr. Donnie Coffin prescribed the pt a mouth rinse.  Jan will call the pt at home with the instructions.  Rn called the pt back and explained why she will not take part in the stomatitis study.  She verbalized understanding.  She said that she is overall tolerating her treatment well.  She reported that she developed some mild joint pain, hot flashes, and fatigue on Sunday, 10/13/11. She said that the joint pain and hot flashes resolved on 10/14/11.  She said the mild fatigue is ongoing.  She also reports that her coughing is much less since she started treatment.  She said that she still gets short of breath, but she thinks her pulmonary status has improved since starting her study drugs.  Dr. Donnie Coffin was notified of the pt's response to treatment.  The pt will be formally evaluated next week on 10/23/11.

## 2011-10-21 ENCOUNTER — Telehealth: Payer: Self-pay | Admitting: *Deleted

## 2011-10-21 NOTE — Telephone Encounter (Signed)
Message on collaborative voicemail from [facility] who is processing a claim for this patient.  Request a start and end date for Tamoxifen 10 mg bid for this patient.  Searched [facility] records.  Dr. Renelda Loma 10-12-1904 office note mentions will start Tamoxifen.  Prescription refill assessments show a script was given to pt. 10-15-2004 with a year supply of refills.  Called [facility] at [address] And they do not have access to refills this far back in their computer.  Patient believes she took tamoxifen through about 2009.  Asked who she received refills from and says possibly our office provided refills.  Last seen April 12, 2005 until Feb. 25, 2013 f/u.    Message left on voicemail for [facility] with Corinda Gubler of a start date in March 17th or March 20th 2006.  End date was perhaps march 2007 or 2009 but no certain end date as patient was a  No show for multiple appoinments due to insurance problems".

## 2011-10-22 ENCOUNTER — Encounter: Payer: Self-pay | Admitting: Surgery

## 2011-10-22 ENCOUNTER — Ambulatory Visit (INDEPENDENT_AMBULATORY_CARE_PROVIDER_SITE_OTHER): Payer: Self-pay | Admitting: Surgery

## 2011-10-22 ENCOUNTER — Other Ambulatory Visit: Payer: Self-pay | Admitting: *Deleted

## 2011-10-22 ENCOUNTER — Ambulatory Visit
Admission: RE | Admit: 2011-10-22 | Discharge: 2011-10-22 | Disposition: A | Discharge status: Home / Self Care | Payer: Self-pay | Source: Ambulatory Visit | Attending: Surgery | Admitting: Surgery

## 2011-10-22 VITALS — BP 136/84 | HR 100 | Resp 16 | Ht 65.0 in | Wt 214.0 lb

## 2011-10-22 DIAGNOSIS — J910 Malignant pleural effusion: Secondary | ICD-10-CM

## 2011-10-22 DIAGNOSIS — J90 Pleural effusion, not elsewhere classified: Secondary | ICD-10-CM

## 2011-10-22 DIAGNOSIS — C50919 Malignant neoplasm of unspecified site of unspecified female breast: Secondary | ICD-10-CM

## 2011-10-22 DIAGNOSIS — Z09 Encounter for follow-up examination after completed treatment for conditions other than malignant neoplasm: Secondary | ICD-10-CM

## 2011-10-22 DIAGNOSIS — I313 Pericardial effusion (noninflammatory): Secondary | ICD-10-CM

## 2011-10-22 DIAGNOSIS — I319 Disease of pericardium, unspecified: Secondary | ICD-10-CM

## 2011-10-22 NOTE — Progress Notes (Signed)
                   [address]            [place] Parks [address]          [phone]     HPI:  Patient returns for routine postoperative follow-up having undergone subxiphoid pericardial window and insertion of a right Pleurx catheter on 09/11/2011. Barbara Parks is a 61 year old [name] with newly diagnosed metastatic ER positive breast carcinoma with involvement of the right pleura and pericardium.  The patient's early postoperative recovery while in the hospital was notable for an uncomplicated postoperative course. Since hospital discharge the patient reports she has felt better than she has in a long time. She denies any cough or shortness of breath. She is on an oral chemotherapy trial. The Pleurx catheter is being drained every 4 days and has been putting out about 75 cc over that period.   Current Outpatient Prescriptions  Medication Sig Dispense Refill  . albuterol (PROVENTIL HFA;VENTOLIN HFA) 108 (90 BASE) MCG/ACT inhaler Inhale 2 puffs into the lungs every 6 (six) hours as needed. For shortness of breath      . chlorpheniramine-HYDROcodone (TUSSIONEX PENNKINETIC ER) 10-8 MG/5ML LQCR Take 5 mLs by mouth every 12 (twelve) hours.  140 mL  0  . Investigational everolimus (RAD001) 5 MG tablet [employer] [id number] Take 2 tablets by mouth daily. Take with a glass of water.  70 tablet  0  . letrozole (FEMARA) 2.5 MG tablet Take 1 tablet (2.5 mg total) by mouth daily.  30 tablet  0  . metoprolol tartrate (LOPRESSOR) 25 MG tablet Take 12.5 mg by mouth 1 day or 1 dose.       Marland Kitchen omeprazole (PRILOSEC) 20 MG capsule Take 20 mg by mouth daily.        Physical Exam: BP 136/84  Pulse 100  Resp 16  Ht 5\' 5"  (1.651 m)  Wt 214 lb (97.07 kg)  BMI 35.61 kg/m2  SpO2 97% She looks well. Lung exam is clear. Cardiac exam shows regular rate and rhythm with normal heart sounds. The subxiphoid incision is healing well.  Diagnostic Tests:  CHEST - 2 VIEW     Comparison: Chest x-ray 09/30/2011.   Findings: The Pleurx drainage catheter is in stable position.  No definite residual pleural effusion.  Minimal basilar atelectasis or scarring.  Stable surgical changes from right mastectomy.  The left lung is clear.   IMPRESSION: Stable position of the Pleurx drainage catheter without residual pleural effusion.     Impression:  Overall she is doing well following her surgery. I would recommend keeping the Pleurx catheter in for now since it is still draining some fluid, although only 75 cc over 4 days. I would expect this drainage to completely cease over the next few weeks.  Plan:  I plan to see her back in 3 weeks. I asked her to contact me if the Pleurx catheter stops draining any fluid in that period of time.

## 2011-10-23 ENCOUNTER — Encounter: Payer: Self-pay | Admitting: *Deleted

## 2011-10-23 ENCOUNTER — Encounter: Payer: Self-pay | Admitting: Physician Assistant

## 2011-10-23 ENCOUNTER — Other Ambulatory Visit (HOSPITAL_BASED_OUTPATIENT_CLINIC_OR_DEPARTMENT_OTHER): Payer: No Typology Code available for payment source | Admitting: Lab

## 2011-10-23 ENCOUNTER — Ambulatory Visit (HOSPITAL_BASED_OUTPATIENT_CLINIC_OR_DEPARTMENT_OTHER): Payer: No Typology Code available for payment source | Admitting: Physician Assistant

## 2011-10-23 VITALS — BP 121/76 | HR 94 | Temp 98.3°F | Ht 65.0 in | Wt 191.7 lb

## 2011-10-23 DIAGNOSIS — C50919 Malignant neoplasm of unspecified site of unspecified female breast: Secondary | ICD-10-CM

## 2011-10-23 DIAGNOSIS — N390 Urinary tract infection, site not specified: Secondary | ICD-10-CM

## 2011-10-23 DIAGNOSIS — C779 Secondary and unspecified malignant neoplasm of lymph node, unspecified: Secondary | ICD-10-CM

## 2011-10-23 DIAGNOSIS — C782 Secondary malignant neoplasm of pleura: Secondary | ICD-10-CM

## 2011-10-23 LAB — COMPREHENSIVE METABOLIC PANEL
ALT: 28 U/L (ref 0–35)
CO2: 26 mEq/L (ref 19–32)
Calcium: 9.1 mg/dL (ref 8.4–10.5)
Chloride: 106 mEq/L (ref 96–112)
Creatinine, Ser: 0.74 mg/dL (ref 0.50–1.10)
Glucose, Bld: 142 mg/dL — ABNORMAL HIGH (ref 70–99)
Sodium: 138 mEq/L (ref 135–145)
Total Bilirubin: 0.2 mg/dL — ABNORMAL LOW (ref 0.3–1.2)
Total Protein: 6.9 g/dL (ref 6.0–8.3)

## 2011-10-23 LAB — CBC WITH DIFFERENTIAL/PLATELET
BASO%: 1 % (ref 0.0–2.0)
Eosinophils Absolute: 0.2 10*3/uL (ref 0.0–0.5)
HGB: 14.5 g/dL (ref 11.6–15.9)
LYMPH%: 22.2 % (ref 14.0–49.7)
MCH: 27.6 pg (ref 25.1–34.0)
MCHC: 32.9 g/dL (ref 31.5–36.0)
MCV: 83.9 fL (ref 79.5–101.0)
MONO#: 0.4 10*3/uL (ref 0.1–0.9)
NEUT%: 66 % (ref 38.4–76.8)
RBC: 5.24 10*6/uL (ref 3.70–5.45)
lymph#: 1.1 10*3/uL (ref 0.9–3.3)

## 2011-10-23 LAB — URINALYSIS, MICROSCOPIC - CHCC
Ketones: NEGATIVE mg/dL
Leukocyte Esterase: NEGATIVE
Protein: <30 mg/dL
pH: 5 (ref 4.6–8.0)

## 2011-10-23 LAB — LACTATE DEHYDROGENASE: LDH: 177 U/L (ref 94–250)

## 2011-10-23 LAB — CANCER ANTIGEN 27.29: CA 27.29: 117 U/mL — ABNORMAL HIGH (ref 0–39)

## 2011-10-23 NOTE — Progress Notes (Signed)
Hematology and Oncology Follow Up Visit  Barbara Parks [id number] March 05, 1951 61 y.o. 10/23/2011    HPI: Barbara Parks is a 61 year old [name] with newly diagnosed metastatic ER positive breast carcinoma with involvement of the right pleura and pericardium. Pleurx catheter is still in place, followed by Dr. Laneta Simmers. She is participating in the [facility] study, for which she ia taking letrozole 2.5 mg per day and Afinitor 10 mg per day.  2. Urinary tract infection-recent completion Septra DS 1 by mouth twice a day x7 days.  Interim History:   Barbara Parks is seen today in in followup for tolerance assessment pertaining to letrozole 2.5 mg per day given in combination with Afinitor 10 mg per day per the BOLERO-4  study. She is actually feeling quite well at this time. Her cough has pretty much completely abated, she does note that of a cough though right before she needs to drain her Pleurx catheter which occurs about every 3-4 days, though the frequency seems to be extending. She did experience some myalgias and arthralgias correlating with development of a mild sore on 10/14/2011. The myalgias and arthralgias were self-limiting after a couple of days, they did seem to drain her bit pale. She is continuing to have hot flashes, though overall fatigue has improved significantly. The mucositis has also completely abated after utilizing a baking soda/salt/warm water mouth rinses. She denies any increasing shortness of breath or chest pain. She did have an episode of nausea occurring early in the morning on 10/20/2011, he did awaken her, she did not actually have emesis, and the symptoms were gone upon awakening the next morning. She has had no recurrence since. She denies any bleeding or bruising symptoms. She states "I feel better that I have been over a year".  A detailed review of systems is otherwise noncontributory as noted below.  Review of Systems: Constitutional: No fevers or  chills, fatigue has improved significantly, she does have hot flashes fairly consistently but are not affecting her ADLs. Eyes: uses glasses ENT: 1 episode of mucositis which has since abated. Cardiovascular: no chest pain or dyspnea on exertion Respiratory: Cough has pretty much abated except when her Pleurx catheter needs to be drained, occurring every 3-4 days. Neurological: no TIA or stroke symptoms Dermatological: negative Gastrointestinal: no abdominal pain, change in bowel habits, or black or bloody stools Genito-Urinary: positive for - dysuria and urinary frequency/urgency Hematological and Lymphatic: negative Breast: negative Musculoskeletal: negative Remaining ROS negative.    Medications:   I have reviewed the patient's current medications.  Current Outpatient Prescriptions  Medication Sig Dispense Refill  . albuterol (PROVENTIL HFA;VENTOLIN HFA) 108 (90 BASE) MCG/ACT inhaler Inhale 2 puffs into the lungs every 6 (six) hours as needed. For shortness of breath      . Investigational everolimus (RAD001) 5 MG tablet [employer]is [id number] Take 2 tablets by mouth daily. Take with a glass of water.  70 tablet  0  . letrozole (FEMARA) 2.5 MG tablet Take 1 tablet (2.5 mg total) by mouth daily.  30 tablet  0  . metoprolol tartrate (LOPRESSOR) 25 MG tablet Take 12.5 mg by mouth 1 day or 1 dose.       Marland Kitchen omeprazole (PRILOSEC) 20 MG capsule Take 20 mg by mouth daily.      . chlorpheniramine-HYDROcodone (TUSSIONEX PENNKINETIC ER) 10-8 MG/5ML LQCR Take 5 mLs by mouth every 12 (twelve) hours.  140 mL  0    Allergies:  Allergies  Allergen Reactions  .  Aspirin     REACTION: upset stomach  . Latex Other (See Comments)    Blistering and skin peels off  . Nsaids Nausea And Vomiting    Extreme nausea and vomiting    Physical Exam: Filed Vitals:   10/23/11 1308  BP: 121/76  Pulse: 94  Temp: 98.3 F (36.8 C)    Body mass index is 31.90 kg/(m^2). Weight: 191 lbs. HEENT:  Sclerae  anicteric, conjunctivae pink.  Oropharynx clear.  No mucositis or candidiasis.   Nodes:  No cervical, supraclavicular, or axillary lymphadenopathy palpated.  Breast Exam: Deferred.  Lungs:  Clear to auscultation bilaterally.  No crackles, rhonchi, or wheezes.   Heart:  Regular rate and rhythm.    ECOG: 0  Lab Results: Lab Results  Component Value Date   WBC 5.1 10/23/2011   HGB 14.5 10/23/2011   HCT 44.0 10/23/2011   MCV 83.9 10/23/2011   PLT 167 10/23/2011   NEUTROABS 3.4 10/23/2011     Chemistry      Component Value Date/Time   NA 136 10/07/2011 0906   K 4.5 10/07/2011 0906   CL 100 10/07/2011 0906   CO2 29 10/07/2011 0906   BUN 18 10/07/2011 0906   CREATININE 0.93 10/07/2011 0906      Component Value Date/Time   CALCIUM 10.2 10/07/2011 0906   ALKPHOS 107 10/07/2011 0906   AST 20 10/07/2011 0906   ALT 20 10/07/2011 0906   BILITOT 0.4 10/07/2011 0906      Lab Results  Component Value Date   LABCA2 106* 09/23/2011    Assessment:  Barbara Parks is a 61 year old [name] with newly diagnosed metastatic ER positive breast carcinoma with involvement of the right pleura and pericardium. Pleurx catheter is still in place, followed by Dr. Laneta Simmers. She is participating in the [place] study, for which she ia taking letrozole 2.5 mg per day and Afinitor 10 mg per day.  2. Urinary tract infection-recent completion Septra DS 1 by mouth twice a day x7 days.   Case has been reviewed with Dr. Pierce Crane.   Plan:  Barbara Parks will continue on Femara 2.5 mg by mouth daily in combination with Afinitor 10 mg per day per study today as scheduled. We will see her back in 2 weeks' time for reassessment to include repeat CBC serum chemistry and urinalysis. She'll continue to utilize the baking soda/salt warm water combination if any mucositis symptoms should recur, she was also instructed to contact us if the symptoms resurface.  This plan was reviewed with the patient, who voices  understanding and agreement.  She knows to call with any changes or problems.    Barbara Parks T, PA-C 10/23/2011

## 2011-10-23 NOTE — Progress Notes (Signed)
10/23/11- at 2:20pm - The pt was into the cancer center this afternoon for a follow-up visit after starting her study drugs on 10/10/11.  The pt reports that she feels the best that she has felt "in a year".  She is shopping and performing all of her usual activities without any difficulty.  ECOG=0.  She states that she still has an ongoing cough, but it is much improved over the past 2 weeks.  She states that she had some mild nausea on 10/20/11.  She denied any episodes of vomiting.  She said the nausea resolved on Monday, 10/21/11.  She specifically denies any diarrhea and constipation.  She states she stopped her Bactrim antibiotic on 10/17/11.  Her repeat urinalysis still shows some bacteria and blood.  The pt was seen and examined today by Dr. Renelda Loma PA, Debbora Presto.  The pt has not had any medication changes.  The pt brought her study drugs with her.  The pt went over how she is taking the study drugs with the research nurse.  The pt is taking her study drugs as prescribed.  The pt stated that her back pain resolved on 10/14/11.  She denies any stomatitis/oral mucositis problems at present.  She said her oral mucositis resolved on 10/18/11.  She said that she is having ongoing mild hot flashes.  She said that she does have some mild fatigue if she over-exerts herself.  The pt is aware of her cycle 2 appointment on 11/07/11.

## 2011-11-01 ENCOUNTER — Telehealth: Payer: Self-pay | Admitting: *Deleted

## 2011-11-01 DIAGNOSIS — C50919 Malignant neoplasm of unspecified site of unspecified female breast: Secondary | ICD-10-CM

## 2011-11-01 MED ORDER — PROCHLORPERAZINE MALEATE 10 MG PO TABS: 10.0000 mg | ORAL_TABLET | Freq: Four times a day (QID) | ORAL | Status: DC | PRN

## 2011-11-01 NOTE — Telephone Encounter (Signed)
Received call from pt reporting that she is having some nausea with the research pill she is taking & wants to know if there is anything OTC that she can take.  Reported that there probably is not anything that is really helpful OTC & told her we would discuss with her MD.  She also reports diarrhea; @ 3 stools/d & wanted to know if it was OK to take lomotil.  She was given approval for this.  She can be reached at [phone] & OK to leave a message.  Her pharmacy is [facility] in [place], [address]  Note to MD.

## 2011-11-05 ENCOUNTER — Telehealth: Payer: Self-pay | Admitting: *Deleted

## 2011-11-05 NOTE — Telephone Encounter (Signed)
11/05/11- at 11:06am - Rn called the pt to remind her to bring in all of her medications on Thursday.  The pt was reminded that she must fast for her labs.  She was asked how she was feeling with regards to her nausea and diarrhea which was reported on 11/01/11.  She said she began the compazine on 11/01/11 which relieved her nausea.  She said that she was only instructed to change her diet with regards to her diarrhea.  She said that her diarrhea worsened on 11/02/11 with between 4-6 stools - grade 2 diarrhea.  She said that it eventually stopped.  She reports that she is feeling fine this am.  She said that she drank Gatorade on Saturday to help replenish her lost volume related to her diarrhea.  The pt was told that she can take Immodium in the future at the earliest onset (e.g., 4mg  orally followed by 2mg  orally every 2 hours until resolution of diarrhea).  The pt verbalized understanding.  Rn spoke to the Triage nurse, Sabino Snipes, about the pt call.  The triage nurse said that she told the pt to take Immodium as needed.  Rn encouraged the pt to call for any problems or concerns.

## 2011-11-07 ENCOUNTER — Encounter: Payer: Self-pay | Admitting: Physician Assistant

## 2011-11-07 ENCOUNTER — Telehealth: Payer: Self-pay | Admitting: *Deleted

## 2011-11-07 ENCOUNTER — Other Ambulatory Visit (HOSPITAL_BASED_OUTPATIENT_CLINIC_OR_DEPARTMENT_OTHER): Payer: Self-pay | Admitting: Lab

## 2011-11-07 ENCOUNTER — Other Ambulatory Visit: Payer: Self-pay | Admitting: Physician Assistant

## 2011-11-07 ENCOUNTER — Ambulatory Visit (HOSPITAL_BASED_OUTPATIENT_CLINIC_OR_DEPARTMENT_OTHER): Payer: Self-pay | Admitting: Physician Assistant

## 2011-11-07 ENCOUNTER — Encounter: Payer: Self-pay | Admitting: *Deleted

## 2011-11-07 DIAGNOSIS — N390 Urinary tract infection, site not specified: Secondary | ICD-10-CM

## 2011-11-07 DIAGNOSIS — C50919 Malignant neoplasm of unspecified site of unspecified female breast: Secondary | ICD-10-CM

## 2011-11-07 DIAGNOSIS — C800 Disseminated malignant neoplasm, unspecified: Secondary | ICD-10-CM

## 2011-11-07 DIAGNOSIS — R319 Hematuria, unspecified: Secondary | ICD-10-CM

## 2011-11-07 LAB — URINALYSIS, MICROSCOPIC - CHCC
Nitrite: NEGATIVE
Protein: <30 mg/dL
Specific Gravity, Urine: 1.03 (ref 1.003–1.035)
pH: 6 (ref 4.6–8.0)

## 2011-11-07 LAB — COMPREHENSIVE METABOLIC PANEL
Alkaline Phosphatase: 157 U/L — ABNORMAL HIGH (ref 39–117)
Glucose, Bld: 113 mg/dL — ABNORMAL HIGH (ref 70–99)
Sodium: 139 mEq/L (ref 135–145)
Total Bilirubin: 0.2 mg/dL — ABNORMAL LOW (ref 0.3–1.2)
Total Protein: 7.8 g/dL (ref 6.0–8.3)

## 2011-11-07 LAB — CBC WITH DIFFERENTIAL/PLATELET
Basophils Absolute: 0.1 10*3/uL (ref 0.0–0.1)
EOS%: 1.4 % (ref 0.0–7.0)
HGB: 13.9 g/dL (ref 11.6–15.9)
MCH: 27.5 pg (ref 25.1–34.0)
MCHC: 33.8 g/dL (ref 31.5–36.0)
MCV: 81.5 fL (ref 79.5–101.0)
MONO%: 11.7 % (ref 0.0–14.0)
RBC: 5.04 10*6/uL (ref 3.70–5.45)
RDW: 13.7 % (ref 11.2–14.5)

## 2011-11-07 LAB — LIPID PANEL
Cholesterol: 276 mg/dL — ABNORMAL HIGH (ref 0–200)
Triglycerides: 305 mg/dL — ABNORMAL HIGH (ref <150)
VLDL: 61 mg/dL — ABNORMAL HIGH (ref 0–40)

## 2011-11-07 LAB — CK: Total CK: 76 U/L (ref 7–177)

## 2011-11-07 LAB — PHOSPHORUS: Phosphorus: 3.2 mg/dL (ref 2.3–4.6)

## 2011-11-07 NOTE — Progress Notes (Signed)
11/07/11 at 1:04pm- The pt was into the cancer center today for her cycle 2 assessments.  She confirmed that she had been fasting overnight upon arrival to the lab.  Her study required labs were drawn as well as her biomarker research labs.  The pt's cbc/diff was within normal limits.  However, her stat cmet revealed a grade 2 ALT value.  Dr. Donnie Coffin suspects either the everolimus or letrozole for the increase in her ALT value.  She also had a repeat, unscheduled urinalysis to follow up on her previously reported UTI.  Her urinalysis revealed a "moderate" amount of blood and "few" bacteria.  The physician has requested a culture to be performed on the urine sample.  The pt was seated for 5 minutes before her vitals were taken.  The pt then had 3 BP and pulse readings 1-2 minutes apart.  The pt was seen and examined today by both Dr. Donnie Coffin and Debbora Presto, PA.  The pt reports a moderate headache today - headache-grade 2.  The PA felt that the headache could be related to the pt being outside so much yesterday with the pollen counts.   She also reports a bilateral rash on her lower extremities which is suspected to be related to everolimus.  She denies any itching or pain associated with the rash.  Rash- grade 1.  She reports some muscular soreness around her chest that is probably related to her fishing and mowing her grass yesterday.  The pt also reported some mild palpitations yesterday evening (palpitations- grade 1).  The PA ordered an EKG, and it had no noted changes from her baseline EKG.  She also states her diarrhea resolved on 11/06/11 after she began Immodium on 11/05/11.  She returned her cycle 1 box of study drug.  She stated that she did not miss any doses of her everolimus and her letrozole.  The pt had taken 56 doses tablets of everolimus.  She also had 2 letrozole tablets left out of her 30 tablet bottle of letrozole.  She has been 100% compliant for cycle 1.  The pt states that she still has some mild  fatigue, but she felt good yesterday and was able to do many physical activities.  ECOG=0.  The protocol states that the dose of study drug should be "interrupted until resolution to grade <1 and to restart at the same dose".  Table 6-11.  The research nurse spoke to [employer] about the interruption.  The site was advised to follow the protocol.  Dr. Donnie Coffin was in agreement to hold for 1 week.  A repeat Cmet will be performed next week, and hopefully the pt will resume her treatment.  The pt is aware that scans will be performed at the end of this cycle.  The CRA advised the staff that only the cmet will need to be re-drawned next week.  The pt's cycle 2 study drug box of everolimus was not dispensed to the pt.  The pt was told to hold both the everolimus and letrozole.    11/08/11 at 1:18pm- Research nurse called the pt to check on her status.  She states her headache is resolved today.  She said that her chest soreness is much improved.  She states that she knows that she just "over did it on Wednesday".  She denies anymore episodes of palpitations.  She was informed that her cholesterol and her triglycerides were increased from baseline.  Rn said that Dr. Renelda Loma nurse may  call her in a new prescription medication to help lower these lipid values.  She verbalized understanding.  She said that she would also watch her diet and eat more fruits and vegetables.  She said that she did experience some insomnia last night.  Grade 1 insomnia.  She is aware of her appointment on 11/14/11.

## 2011-11-07 NOTE — Progress Notes (Signed)
Hematology and Oncology Follow Up Visit  Barbara Parks [id number] [birth year] 61 y.o. 11/07/2011    HPI: Barbara Parks is a 61 year old [name] with newly diagnosed metastatic ER positive breast carcinoma with involvement of the right pleura and pericardium. Pleurx catheter is still in place, followed by Dr. Laneta Simmers. She is participating in the [place] study, for which she ia taking letrozole 2.5 mg per day and Afinitor 10 mg per day.  2. Urinary tract infection-recent completion Septra DS 1 by mouth twice a day x7 days.  Interim History:   Barbara Parks is seen today in in followup for tolerance assessment pertaining to letrozole 2.5 mg per day given in combination with Afinitor 10 mg per day per the BOLERO-4  study. Since her last office visit on 10/23/2011, she's had a bit of a rough or time. She admits to episodes of nausea and grade 2 diarrhea symptoms which initiated on 10/31/2011 and progressed until yesterday. She is also appreciated a bit of a rash of her lower extremities which also started on 10/31/2011. It is not pruritic, she states they are not painful. Yesterday, she felt "great", mowed her lawn, and then went fishing. Yesterday evening, she was experiencing some episodes of "chest tightness" and notes that she can actually reproduce some soreness even today. She did not have any increasing cough. She did have what almost sounds like palpitations which academy awake at night". It lasted a couple of hours and has since dissipated. It has not recurred today. Upon awakening this morning, she also has described a fleeting but shooting left-sided headache, no nausea associated with no vision change. He is currently on present. No episodes of neuropathy. Her Pleurx catheter still in place, she straining about 50 cc every 4 days. She has had no recurrent mucositis. A detailed review of systems is otherwise noncontributory as noted below.  Review of Systems: Constitutional: No  fevers or chills, fatigue has improved significantly, she does have hot flashes fairly consistently but are not affecting her ADLs. Eyes: uses glasses ENT: 1 episode of mucositis which has since abated. Cardiovascular: As above. Respiratory: Cough has pretty much abated except when her Pleurx catheter needs to be drained, occurring every 3-4 days. Neurological: no TIA or stroke symptoms Dermatological: negative Gastrointestinal:As above. Genito-Urinary: No complaints. Hematological and Lymphatic: negative Breast: negative Musculoskeletal: negative Remaining ROS negative.    Medications:   I have reviewed the patient's current medications.  Current Outpatient Prescriptions  Medication Sig Dispense Refill  . albuterol (PROVENTIL HFA;VENTOLIN HFA) 108 (90 BASE) MCG/ACT inhaler Inhale 2 puffs into the lungs every 6 (six) hours as needed. For shortness of breath      . chlorpheniramine-HYDROcodone (TUSSIONEX PENNKINETIC ER) 10-8 MG/5ML LQCR Take 5 mLs by mouth every 12 (twelve) hours.  140 mL  0  . Investigational everolimus (RAD001) 5 MG tablet Novartis [id number] Take 2 tablets by mouth daily. Take with a glass of water.  70 tablet  0  . letrozole (FEMARA) 2.5 MG tablet Take 1 tablet (2.5 mg total) by mouth daily.  30 tablet  0  . metoprolol tartrate (LOPRESSOR) 25 MG tablet Take 12.5 mg by mouth 1 day or 1 dose.       Marland Kitchen omeprazole (PRILOSEC) 20 MG capsule Take 20 mg by mouth daily.      . prochlorperazine (COMPAZINE) 10 MG tablet Take 1 tablet (10 mg total) by mouth every 6 (six) hours as needed.  30 tablet  1    Allergies:  Allergies  Allergen Reactions  . Aspirin     REACTION: upset stomach  . Latex Other (See Comments)    Blistering and skin peels off  . Nsaids Nausea And Vomiting    Extreme nausea and vomiting    Physical Exam: Filed Vitals:   11/07/11 1105  BP: 121/84  Pulse: 93  Temp: 97.9 F (36.6 C)    Body mass index is 32.95 kg/(m^2). Weight: 187  lbs. HEENT:  Sclerae anicteric, conjunctivae pink.  Oropharynx clear.  No mucositis or candidiasis.  No evidence of thyromegaly. Nodes:  No cervical, supraclavicular, or axillary lymphadenopathy palpated. Breast Exam: Deferred.  Lungs:  Clear to auscultation bilaterally.  No crackles, rhonchi, or wheezes.   Heart:  Regular rate and rhythm, no murmurs rubs gallops or clicks appreciated. ABD: Soft, nontender without organomegaly. Normal to slightly hyperactive bowel sounds appreciated no distention. EXT: Free of pedal edema, cyanosis, or ecchymoses. NEURO: Alert and oriented x3 without any focal deficits.  ECOG: 0  Lab Results: Lab Results  Component Value Date   WBC 7.5 11/07/2011   HGB 13.9 11/07/2011   HCT 41.1 11/07/2011   MCV 81.5 11/07/2011   PLT 240 11/07/2011   NEUTROABS 5.3 11/07/2011     Chemistry      Component Value Date/Time   NA 139 11/07/2011 1026   K 3.7 11/07/2011 1026   CL 104 11/07/2011 1026   CO2 23 11/07/2011 1026   BUN 13 11/07/2011 1026   CREATININE 0.74 11/07/2011 1026      Component Value Date/Time   CALCIUM 9.6 11/07/2011 1026   ALKPHOS 157* 11/07/2011 1026   AST 54* 11/07/2011 1026   ALT 129* 11/07/2011 1026   BILITOT 0.2* 11/07/2011 1026      Lab Results  Component Value Date   LABCA2 117* 10/23/2011   12 lead EKG: 11/07/11- No change from baseline.  Assessment:  Barbara Parks is a 61 year old [name] with newly diagnosed metastatic ER positive breast carcinoma with involvement of the right pleura and pericardium. Pleurx catheter is still in place, followed by Dr. Laneta Simmers. She is participating in the [place] study, for which she ia taking letrozole 2.5 mg per day and Afinitor 10 mg per day. 2. History of prior urinary tract infection now with evidence to suggest recurrent UTI culture is pending. 3. Grade 1 nonblanching/nonpruritic rash of the lower extremities. 4. Grade 2 headache, though currently not symptomatic. 5. History of grade 2  diarrhea which has resolved after use of Imodium. 6. Grade 2 elevation of ALT  Case has been reviewed with Dr. Pierce Crane.   Plan:  We will hold letrozole/Afinitor per protocol guidelines. We'll plan on rechecking [place] in one week's time to include appropriate lab studies for assessment of her ALT. She does not does hesitate seeking medical attention if she should have difficulty suggesting a cardiac event. This plan was reviewed with the patient, who voices understanding and agreement.  She knows to call with any changes or problems.    Aleksey Newbern T, PA-C 11/07/2011

## 2011-11-07 NOTE — Telephone Encounter (Signed)
gave patient appointment for 11-14-2011 printed out calendar and gave to the patient

## 2011-11-08 ENCOUNTER — Encounter: Payer: Self-pay | Admitting: *Deleted

## 2011-11-08 LAB — URINE CULTURE

## 2011-11-08 NOTE — Progress Notes (Signed)
11/08/11 at 9:58am- The pt's lipid panel from 11/07/11 revealed a grade 1 cholesterol and a grade 2 hypertriglyceridemia.   Protocol section 6.2.2.2 states the following:  Grade 2 or higher hypercholesterolemia (>300 mg/dL or 2.13 mmol/L) or grade 2 hypertriglyceridemia or higher (>2.5x upper normal limit) should be treated with a 3-hyroxy- 3-methyl-glutaryl (HMG)-CoA reductase inhibitor (e.g., atorvastatin, pravastatin, fluvastatin) or appropriate triglyceride-lowering medication, in addition to diet.  Rn communicated the pt's grade 2 triglyceride result of 305 with Dr. Donnie Coffin and also reviewed with him the section 6.2.2.2 of the protocol regarding the management of hyperlipidemia.  He will consider initiating medication therapy.

## 2011-11-12 ENCOUNTER — Telehealth: Payer: Self-pay | Admitting: *Deleted

## 2011-11-12 ENCOUNTER — Other Ambulatory Visit: Payer: Self-pay | Admitting: *Deleted

## 2011-11-12 ENCOUNTER — Ambulatory Visit: Payer: Self-pay | Admitting: Surgery

## 2011-11-12 DIAGNOSIS — C50919 Malignant neoplasm of unspecified site of unspecified female breast: Secondary | ICD-10-CM

## 2011-11-12 NOTE — Telephone Encounter (Signed)
per the research nurse changed the patient lab time to 11:45am on 11-14-2011

## 2011-11-14 ENCOUNTER — Encounter: Payer: Self-pay | Admitting: Physician Assistant

## 2011-11-14 ENCOUNTER — Telehealth: Payer: Self-pay | Admitting: Oncology

## 2011-11-14 ENCOUNTER — Telehealth: Payer: Self-pay | Admitting: *Deleted

## 2011-11-14 ENCOUNTER — Encounter: Payer: Self-pay | Admitting: *Deleted

## 2011-11-14 ENCOUNTER — Other Ambulatory Visit: Payer: Self-pay | Admitting: Lab

## 2011-11-14 ENCOUNTER — Other Ambulatory Visit: Payer: Self-pay | Admitting: Oncology

## 2011-11-14 ENCOUNTER — Ambulatory Visit (HOSPITAL_BASED_OUTPATIENT_CLINIC_OR_DEPARTMENT_OTHER): Payer: Self-pay | Admitting: Physician Assistant

## 2011-11-14 VITALS — BP 104/69 | HR 108 | Temp 98.3°F | Ht 63.3 in | Wt 198.4 lb

## 2011-11-14 DIAGNOSIS — E785 Hyperlipidemia, unspecified: Secondary | ICD-10-CM

## 2011-11-14 DIAGNOSIS — R319 Hematuria, unspecified: Secondary | ICD-10-CM

## 2011-11-14 DIAGNOSIS — C50919 Malignant neoplasm of unspecified site of unspecified female breast: Secondary | ICD-10-CM

## 2011-11-14 LAB — COMPREHENSIVE METABOLIC PANEL
Albumin: 3.3 g/dL — ABNORMAL LOW (ref 3.5–5.2)
CO2: 27 mEq/L (ref 19–32)
Calcium: 9.8 mg/dL (ref 8.4–10.5)
Glucose, Bld: 95 mg/dL (ref 70–99)
Sodium: 140 mEq/L (ref 135–145)
Total Bilirubin: 0.2 mg/dL — ABNORMAL LOW (ref 0.3–1.2)
Total Protein: 7.1 g/dL (ref 6.0–8.3)

## 2011-11-14 LAB — URINALYSIS, MICROSCOPIC - CHCC
Bilirubin (Urine): NEGATIVE
Glucose: NEGATIVE g/dL
Leukocyte Esterase: NEGATIVE
WBC, UA: NEGATIVE (ref 0–2)

## 2011-11-14 LAB — CBC WITH DIFFERENTIAL/PLATELET
Basophils Absolute: 0.1 10*3/uL (ref 0.0–0.1)
Eosinophils Absolute: 0.1 10*3/uL (ref 0.0–0.5)
HGB: 12.7 g/dL (ref 11.6–15.9)
MCV: 81.9 fL (ref 79.5–101.0)
MONO%: 9.4 % (ref 0.0–14.0)
NEUT#: 6.9 10*3/uL — ABNORMAL HIGH (ref 1.5–6.5)
Platelets: 288 10*3/uL (ref 145–400)
RDW: 14.6 % — ABNORMAL HIGH (ref 11.2–14.5)

## 2011-11-14 LAB — LIPID PANEL
LDL Cholesterol: 160 mg/dL — ABNORMAL HIGH (ref 0–99)
VLDL: 62 mg/dL — ABNORMAL HIGH (ref 0–40)

## 2011-11-14 MED ORDER — INV-EVEROLIMUS (RAD0001) 5MG TABLET NOVARTIS CRAD001Y24135: 2.0000 | ORAL_TABLET | Freq: Every day | ORAL | Status: DC

## 2011-11-14 MED ORDER — ATORVASTATIN CALCIUM 10 MG PO TABS: 10.0000 mg | ORAL_TABLET | Freq: Every day | ORAL | Status: DC

## 2011-11-14 NOTE — Progress Notes (Signed)
11/14/11- at 2:03pm - Bolero 4, cycle 2, day 8 study notes- The pt was into the cancer center this afternoon for her repeat labs/physical exam to assess for toxicities.  The pt's cbc/diff was within normal limits.  Her chemistries were reviewed, and her ALT was back to a grade 1.  All of her other chemistry abnormal labs were all grade 1 toxicities.  The pt denies any headache today.  The pt does report some edema to lower legs.  Grade 1 The Debbora Presto, [place], reviewed her urinalysis.  She felt that the pt does not have a lingering UTI.  The pt does have some hematuria that is grade 1.  This has been present since baseline, but it was originally felt to be related to her UTI.  The PA stated the pt may be "passing some stones".  The pt denies any urinary symptoms at present.  The pt is performing all of her usual activities.  ECOG=0.  The pt was dispensed her cycle 2 kit of everolimus and was instructed to not take the everolimus or the letrozole now.  The pt was told that her lipid panel is pending, and we need to evaluate her triglycerides before resuming her study drugs.  The pt will be contacted tomorrow am about the status of her lipid panel.  The pt was given a prescription for Lipitor 10mg  by Debbora Presto, PA as suggested by the protocol.  Dr. Donnie Coffin is agreement with the pt continuing her treatment tomorrow if her triglycerides are down to grade 1.  The pt was given her post cycle 2 appt for her restaging scans.  She was also given her cycle 3 appts.  The pt knows to call for any concerns.  The pt also reported some mild forgetfulness- grade 1.  The pt continues to have some hot flashes, mild fatigue and occasional insomnia.    11/14/11- at 4:20pm- Research nurse reviewed the pt's lipid panel with Dr. Donnie Coffin.  The pt's triglycerides remain a grade 2 toxicity.  Per the protocol, the pt is to continue holding her study drugs until it is reduced to a grade 1 toxicity.  Dr. Donnie Coffin is not in agreement with the  protocol.  He feels the pt should be restarted on her study treatment so that her cancer can be treated.   Rn sent an email to Aretha Parrot, Expert Clinical Manager at [employer].  Thurston Hole replied that she would need to discuss this matter with the Medical Monitor.  Dr. Donnie Coffin said that if the study wants to continue to hold her treatment for her triglyceride elevation, then he may withdraw the pt from the study.    11/15/11 at 9:01am- Rn received email confirmation from Aretha Parrot from [employer] that the Wellsite geologist agrees with Dr. Donnie Coffin.  Thurston Hole directed our site to "allow this patient to restart treatment".   Rn informed both Dr. Donnie Coffin and Debbora Presto, PA that the pt will restart her treatment today.  The nurse called the pt and left a message asking the pt to return the nurse's call regarding her treatment.  Rn will await pt's return call.    11/15/11 at 11:00am- The pt returned the nurse's call.  The pt was instructed to begin taking her study drugs today.  The pt was advised to take two 5 mg tablets of the everolimus with one letrozole 2.5 mg tablet at the same time.  The pt was instructed to take the tablets with a full glass  of water and consistently with food or without food.  The pt said that she always takes her pills with food at the same time together.  The pt was urged to call the research nurse if she develops any mouth sores/irritation.  The pt verbalized understanding.  The pt said that she got her Lipitor filled and will start her dose today.

## 2011-11-14 NOTE — Progress Notes (Signed)
Hematology and Oncology Follow Up Visit  Barbara Parks [id number] 1951-07-28 61 y.o. 11/14/2011    HPI: Mrs. Ballew is a 61 year old [name] with newly diagnosed metastatic ER positive breast carcinoma with involvement of the right pleura and pericardium. Pleurx catheter is still in place, followed by Dr. Laneta Simmers. She is participating in the [place] study, for which she had been taking letrozole 2.5 mg per day and Afinitor 10 mg per day, on hold since 11/07/11  2. History of prior urinary tract infection, resolved,     3. Grade 1 nonblanching/nonpruritic rash of the lower Extremities.  4. Grade 2 headache, though currently not symptomatic.  5. History of grade 2 diarrhea which has resolved after use of Imodium.  6. Grade 2 elevation of ALT.  7. Grade 1 elevation of trigylerides.  Interim History:   Mrs. Emile is seen today in in followup for tolerance assessment after holding letrozole 2.5 mg per day given in combination with Afinitor 10 mg per day per the BOLERO-4  study. Since her last office visit on 4/11/3, she's feeling better.  She has headaches, she's had no nausea, emesis, no diarrhea whatsoever. She denies any bleeding or bruising symptoms. She has had no recurrent shortness of breath, cough, or chest pain. She does note though, that she's had some "edema" start of her lower extremities, and interestingly, her Pleurx catheter has put out more pleural fluid i.e. it used to put out between 50-55 cc every 4 days, but most recently, 130 cc.  A detailed review of systems is otherwise noncontributory as noted below.  Review of Systems: Constitutional: No fevers or chills, fatigue has improved significantly, she does have hot flashes fairly consistently but are not affecting her ADLs. Eyes: uses glasses ENT: 1 episode of mucositis which has since abated. Cardiovascular: As above. Respiratory: Cough has pretty much abated except when her Pleurx catheter needs to be  drained, occurring every 3-4 days. Neurological: no TIA or stroke symptoms Dermatological: negative Gastrointestinal:As above. Genito-Urinary: No complaints. Hematological and Lymphatic: negative Breast: negative Musculoskeletal: negative Remaining ROS negative.    Medications:   I have reviewed the patient's current medications.  Current Outpatient Prescriptions  Medication Sig Dispense Refill  . albuterol (PROVENTIL HFA;VENTOLIN HFA) 108 (90 BASE) MCG/ACT inhaler Inhale 2 puffs into the lungs every 6 (six) hours as needed. For shortness of breath      . chlorpheniramine-HYDROcodone (TUSSIONEX PENNKINETIC ER) 10-8 MG/5ML LQCR Take 5 mLs by mouth every 12 (twelve) hours.  140 mL  0  . Investigational everolimus (RAD001) 5 MG tablet Novartis [id number] Take 2 tablets by mouth daily. Take with a glass of water.  70 tablet  0  . letrozole (FEMARA) 2.5 MG tablet Take 1 tablet (2.5 mg total) by mouth daily.  30 tablet  0  . metoprolol tartrate (LOPRESSOR) 25 MG tablet Take 12.5 mg by mouth 1 day or 1 dose.       Marland Kitchen omeprazole (PRILOSEC) 20 MG capsule Take 20 mg by mouth daily.      . prochlorperazine (COMPAZINE) 10 MG tablet Take 1 tablet (10 mg total) by mouth every 6 (six) hours as needed.  30 tablet  1    Allergies:  Allergies  Allergen Reactions  . Aspirin     REACTION: upset stomach  . Latex Other (See Comments)    Blistering and skin peels off  . Nsaids Nausea And Vomiting    Extreme nausea and vomiting    Physical Exam: Ceasar Mons  Vitals:   11/14/11 1312  BP: 104/69  Pulse: 108  Temp: 98.3 F (36.8 C)    Body mass index is 34.81 kg/(m^2). Weight: 198 lbs. HEENT:  Sclerae anicteric, conjunctivae pink.  Oropharynx clear.  No mucositis or candidiasis.  No evidence of thyromegaly. Nodes:  No cervical, supraclavicular, or axillary lymphadenopathy palpated. Breast Exam: Deferred.  Lungs:  Clear to auscultation bilaterally.  No crackles, rhonchi, or wheezes.   Heart:   Regular rate and rhythm, no murmurs rubs gallops or clicks appreciated. ABD: Soft, nontender without organomegaly. Normal to slightly hyperactive bowel sounds appreciated no distention. EXT:  Evidence of grade 1 slightly pitting edema bilaterally lower extremities. No cyanosis, or ecchymoses. Rash is still present. NEURO: Alert and oriented x3 without any focal deficits.  ECOG: 0  Lab Results: Lab Results  Component Value Date   WBC 9.0 11/14/2011   HGB 12.7 11/14/2011   HCT 39.1 11/14/2011   MCV 81.9 11/14/2011   PLT 288 11/14/2011   NEUTROABS 6.9* 11/14/2011     Chemistry      Component Value Date/Time   NA 140 11/14/2011 1104   K 3.9 11/14/2011 1104   CL 104 11/14/2011 1104   CO2 27 11/14/2011 1104   BUN 13 11/14/2011 1104   CREATININE 0.74 11/14/2011 1104      Component Value Date/Time   CALCIUM 9.8 11/14/2011 1104   ALKPHOS 125* 11/14/2011 1104   AST 39* 11/14/2011 1104   ALT 70* 11/14/2011 1104   BILITOT 0.2* 11/14/2011 1104      Lab Results  Component Value Date   LABCA2 117* 10/23/2011   12 lead EKG: 11/07/11- No change from baseline.  Assessment:  Mrs. Venturini is a 61 year old [name] with newly diagnosed metastatic ER positive breast carcinoma with involvement of the right pleura and pericardium. Pleurx catheter is still in place, followed by Dr. Laneta Simmers. She is participating in the [place] study, for which she had been taking letrozole 2.5 mg per day and Afinitor 10 mg per day, on hold since 11/07/11  2. History of prior urinary tract infection, resolved,     2.a Grade 1 hematuria.  3. Grade 1 nonblanching/nonpruritic rash of the lower extremities, persistent.  4. Grade 2 headache, though currently not symptomatic.  5. History of grade 2 diarrhea which has resolved after use of Imodium.  6. Grade 2 elevation of ALT, now at grade 1  7. Grade 1 elevation of trigylerides, pending results today, will initiate Lipitor 10mg  po daily.  Case has been  reviewed with Dr. Pierce Crane.   Plan:  Janell will resume Afinitor at 10mg  po daily, and letrozole 2.5mg  po daily.  Prescription for Lipitor 10mg  po daily given.  Shareece is scheduled for repeat CT scans on 12/03/11, we will plan followup on 12/05/11 for test results, treatment decisions.  Appropriate labs per protocol to also be drawn. This plan was reviewed with the patient, who voices understanding and agreement.  She knows to call with any changes or problems.    Aubryn Spinola T, PA-C 11/14/2011

## 2011-11-14 NOTE — Telephone Encounter (Signed)
gve the pt her may 2013 appt calendar along with the ct scan appt with instructions. °

## 2011-11-14 NOTE — Telephone Encounter (Signed)
per orders from 11-14-2011 made patient appointment for 12-03-2011 arrival time 9:30am

## 2011-11-15 ENCOUNTER — Encounter: Payer: Self-pay | Admitting: Surgery

## 2011-11-15 ENCOUNTER — Ambulatory Visit (INDEPENDENT_AMBULATORY_CARE_PROVIDER_SITE_OTHER): Payer: Self-pay | Admitting: Surgery

## 2011-11-15 VITALS — BP 129/82 | HR 96 | Resp 16 | Ht 65.0 in | Wt 200.0 lb

## 2011-11-15 DIAGNOSIS — I319 Disease of pericardium, unspecified: Secondary | ICD-10-CM

## 2011-11-15 DIAGNOSIS — J910 Malignant pleural effusion: Secondary | ICD-10-CM

## 2011-11-15 DIAGNOSIS — Z9889 Other specified postprocedural states: Secondary | ICD-10-CM

## 2011-11-15 NOTE — Progress Notes (Signed)
HPI:  Patient returns for routine postoperative follow-up having undergone subxiphoid pericardial window and insertion of a right Pleurx catheter on 09/11/2011. Barbara Parks is a 61 year old Barbara Parks woman with newly diagnosed metastatic ER positive breast carcinoma with involvement of the right pleura and pericardium.  The pleurex  catheteri s being drained every fourth day by home health nursing and is draining about 50-70 cc every 4 days. Her next drainage is scheduled for Sunday. She said that she has a lot of pain with drainage.  Current Outpatient Prescriptions  Medication Sig Dispense Refill  . albuterol (PROVENTIL HFA;VENTOLIN HFA) 108 (90 BASE) MCG/ACT inhaler Inhale 2 puffs into the lungs every 6 (six) hours as needed. For shortness of breath      . atorvastatin (LIPITOR) 10 MG tablet Take 1 tablet (10 mg total) by mouth daily.  30 tablet  3  . chlorpheniramine-HYDROcodone (TUSSIONEX PENNKINETIC ER) 10-8 MG/5ML LQCR Take 5 mLs by mouth every 12 (twelve) hours.  140 mL  0  . Investigational everolimus (RAD001) 5 MG tablet Novartis [id number] Take 2 tablets by mouth daily. Take with a glass of water.  70 tablet  0  . Investigational everolimus (RAD001) 5 MG tablet Novartis [id number] Take 2 tablets by mouth daily. Take with a glass of water.  70 tablet  0  . letrozole (FEMARA) 2.5 MG tablet Take 1 tablet (2.5 mg total) by mouth daily.  30 tablet  0  . metoprolol tartrate (LOPRESSOR) 25 MG tablet Take 12.5 mg by mouth 1 day or 1 dose.       Marland Kitchen omeprazole (PRILOSEC) 20 MG capsule Take 20 mg by mouth daily.      . prochlorperazine (COMPAZINE) 10 MG tablet Take 10 mg by mouth every 6 (six) hours as needed.         Physical Exam: BP 129/82  Pulse 96  Resp 16  Ht 5\' 5"  (1.651 m)  Wt 200 lb (90.719 kg)  BMI 33.28 kg/m2  SpO2 98% She looks well. Lungs clear. Her epigastric incision is healing well.  Diagnostic Tests: None  Impression: She has  fairly low drainage  from the Pleurx catheter and it is just about time to remove it. She said that her oncologist felt that it should stay in until there is no drainage at all. The catheter is draining the same amount every 4 days for the past month and drainage may not decrease much more than this. I am concerned that if the catheter stays in too long she may develop a catheter related infection. I'll plan to see her back in about 3 weeks and we'll need to make a decision about removing the catheter or else consider instilling some talc solution to effect complete pleurodesis.  Plan: She will return to see me in 3 weeks. She is scheduled to have a CT scan of the chest and abdomen on May 7 and I will see her shortly after that.

## 2011-12-03 ENCOUNTER — Ambulatory Visit (HOSPITAL_COMMUNITY)
Admission: RE | Admit: 2011-12-03 | Discharge: 2011-12-03 | Disposition: A | Discharge status: Home / Self Care | Payer: Self-pay | Source: Ambulatory Visit | Attending: Oncology | Admitting: Oncology

## 2011-12-03 ENCOUNTER — Other Ambulatory Visit (HOSPITAL_COMMUNITY): Payer: Self-pay

## 2011-12-03 ENCOUNTER — Encounter (HOSPITAL_COMMUNITY): Payer: Self-pay

## 2011-12-03 DIAGNOSIS — Z9221 Personal history of antineoplastic chemotherapy: Secondary | ICD-10-CM | POA: Insufficient documentation

## 2011-12-03 DIAGNOSIS — C50919 Malignant neoplasm of unspecified site of unspecified female breast: Secondary | ICD-10-CM | POA: Insufficient documentation

## 2011-12-03 DIAGNOSIS — R05 Cough: Secondary | ICD-10-CM | POA: Insufficient documentation

## 2011-12-03 DIAGNOSIS — K7689 Other specified diseases of liver: Secondary | ICD-10-CM | POA: Insufficient documentation

## 2011-12-03 DIAGNOSIS — I517 Cardiomegaly: Secondary | ICD-10-CM | POA: Insufficient documentation

## 2011-12-03 DIAGNOSIS — C7800 Secondary malignant neoplasm of unspecified lung: Secondary | ICD-10-CM | POA: Insufficient documentation

## 2011-12-03 DIAGNOSIS — R0602 Shortness of breath: Secondary | ICD-10-CM | POA: Insufficient documentation

## 2011-12-03 DIAGNOSIS — K449 Diaphragmatic hernia without obstruction or gangrene: Secondary | ICD-10-CM | POA: Insufficient documentation

## 2011-12-03 DIAGNOSIS — J90 Pleural effusion, not elsewhere classified: Secondary | ICD-10-CM | POA: Insufficient documentation

## 2011-12-03 DIAGNOSIS — R059 Cough, unspecified: Secondary | ICD-10-CM | POA: Insufficient documentation

## 2011-12-03 DIAGNOSIS — M899 Disorder of bone, unspecified: Secondary | ICD-10-CM | POA: Insufficient documentation

## 2011-12-03 HISTORY — DX: Secondary malignant neoplasm of unspecified lung: C78.00

## 2011-12-03 MED ORDER — IOHEXOL 300 MG/ML  SOLN
100.0000 mL | Freq: Once | INTRAMUSCULAR | Status: AC | PRN
  Administered 2011-12-03: 100 mL via INTRAVENOUS

## 2011-12-05 ENCOUNTER — Other Ambulatory Visit (HOSPITAL_BASED_OUTPATIENT_CLINIC_OR_DEPARTMENT_OTHER): Payer: Self-pay | Admitting: Lab

## 2011-12-05 ENCOUNTER — Encounter: Payer: Self-pay | Admitting: *Deleted

## 2011-12-05 ENCOUNTER — Telehealth: Payer: Self-pay | Admitting: *Deleted

## 2011-12-05 ENCOUNTER — Encounter: Payer: Self-pay | Admitting: Physician Assistant

## 2011-12-05 ENCOUNTER — Ambulatory Visit (HOSPITAL_BASED_OUTPATIENT_CLINIC_OR_DEPARTMENT_OTHER): Payer: Self-pay | Admitting: Physician Assistant

## 2011-12-05 VITALS — BP 105/78 | HR 89 | Temp 97.7°F | Ht 65.0 in | Wt 193.6 lb

## 2011-12-05 DIAGNOSIS — C782 Secondary malignant neoplasm of pleura: Secondary | ICD-10-CM

## 2011-12-05 DIAGNOSIS — C50919 Malignant neoplasm of unspecified site of unspecified female breast: Secondary | ICD-10-CM

## 2011-12-05 DIAGNOSIS — C779 Secondary and unspecified malignant neoplasm of lymph node, unspecified: Secondary | ICD-10-CM

## 2011-12-05 DIAGNOSIS — R21 Rash and other nonspecific skin eruption: Secondary | ICD-10-CM

## 2011-12-05 LAB — LIPID PANEL
LDL Cholesterol: 95 mg/dL (ref 0–99)
Triglycerides: 394 mg/dL — ABNORMAL HIGH (ref <150)

## 2011-12-05 LAB — COMPREHENSIVE METABOLIC PANEL
ALT: 37 U/L — ABNORMAL HIGH (ref 0–35)
BUN: 12 mg/dL (ref 6–23)
CO2: 26 mEq/L (ref 19–32)
Calcium: 9.5 mg/dL (ref 8.4–10.5)
Chloride: 104 mEq/L (ref 96–112)
Creatinine, Ser: 0.85 mg/dL (ref 0.50–1.10)
Glucose, Bld: 121 mg/dL — ABNORMAL HIGH (ref 70–99)

## 2011-12-05 LAB — CBC WITH DIFFERENTIAL/PLATELET
Basophils Absolute: 0.1 10*3/uL (ref 0.0–0.1)
Eosinophils Absolute: 0.2 10*3/uL (ref 0.0–0.5)
HCT: 41.7 % (ref 34.8–46.6)
LYMPH%: 10.9 % — ABNORMAL LOW (ref 14.0–49.7)
MCV: 80 fL (ref 79.5–101.0)
MONO#: 0.6 10*3/uL (ref 0.1–0.9)
NEUT#: 7.8 10*3/uL — ABNORMAL HIGH (ref 1.5–6.5)
NEUT%: 80.9 % — ABNORMAL HIGH (ref 38.4–76.8)
Platelets: 242 10*3/uL (ref 145–400)
WBC: 9.7 10*3/uL (ref 3.9–10.3)

## 2011-12-05 LAB — BILIRUBIN, DIRECT: Bilirubin, Direct: 0.1 mg/dL (ref 0.0–0.3)

## 2011-12-05 LAB — CK: Total CK: 71 U/L (ref 7–177)

## 2011-12-05 LAB — RESEARCH LABS

## 2011-12-05 MED ORDER — INV-EVEROLIMUS (RAD0001) 5MG TABLET NOVARTIS CRAD001Y24135: 2.0000 | ORAL_TABLET | Freq: Every day | ORAL | Status: DC

## 2011-12-05 NOTE — Telephone Encounter (Signed)
per timing that the patient needs to be fasting for the testing per the research nurse moved patient to 01-03-2012 starting at 10:15am 

## 2011-12-05 NOTE — Telephone Encounter (Signed)
gave patient appointment for 01-02-2012 starting at 1:15pm with labs printed out  calendar and gave to the patient

## 2011-12-05 NOTE — Progress Notes (Signed)
12/05/11 at 1:25pm- The pt was into the cancer center this am for her cycle 3 assessments.  She confirmed that she was fasting all night and this morning before her study required labs were drawn.  Her biomarkers were also obtained for this timepoint.  She returned her study drug box for drug accountability purposes.  She was dispensed 70 tablets of everolimus on 11/14/11.  She was advised to resume her dosing on 11/15/11.  She confirmed that she took her 2 pills of everolimus everyday from 11/15/11 through 12/04/11 ( 20 days).  Her study drug box confirmed that she took 40 tablets (20 days x 2 doses/day).  She returned 30 unopened blister packs of everolimus.  The pharmacist and the research nurse confirmed that the pt has been 100% compliant in taking her study drug.  She also confirmed that she has taken her letrozole (1 tablet) everyday from 11/15/11 through 12/04/11.  The pt was seen and examined by Debbora Presto, PA today.  Dr. Donnie Coffin reviewed her post cycle 2 scans.  The pt was informed that she has had a response to her treatment.  She is agreement to proceed with her next cycle.  The pt denies any new adverse events.  She reports that she is tolerating her study drugs well.  The pt said that she is doing all of her normal activities.  She plans to mow her huge yard today.  ECOG=0.  She reports the following AE's as ongoing:  fatigue, hot flashes, rash to lower extremities, and mild memory impairment.   She reports that her insomnia lasted only 1 night, and it resolved by 11/08/11.  The PA examined her lower extremities and felt that the pt's edema-limbs is resolved.  She specifically denies any vomiting, diarrhea, numbness and tingling, and mouth sores.  She states that she still has some dyspnea on exertion (reported at baseline).  She states she had some mild nausea (no emesis) this am, but her nausea has resolved.  The pt's weight is stable and her vitals were obtained after she had been seated for 5 minutes.  The  pt then had 3 consecutive sets of vitals taken 1-2 minutes apart.  Her vitals were within normal limits.  The research nurse also reviewed the pt's current medication list with the pt for accuracy.  The pt denied any medication changes.  The pt confimed that she has been taking her Lipitor daily.  The pt's lipid panel is pending at this time.  The PA reviewed the pt's cbc/diff and her stat Cmet.  No dose modifications are indicated at this time.  The pt was given her cycle 3 study drug box for self administration.  She was instructed to continue her 10 mg of everolimus dosing per day (2 tablets).  The pt confirmed that she has enough letrozole for her next cycle.   The pt is aware of her next appointments on 01/02/11.    12/06/11 at 10:31am- The pt's lipid panel is back and the pt's hypertriglyceridemia is ongoing.  The research nurse called the pt to let her know about her results and to discuss her July delayed cycle 5 appointments.  Will await pt's return call.    12/06/11 at 2:27pm - The research nurse spoke to the pt regarding her cycle 5 appointments.  The pt said that she could not have the scans on the same day as her lab/MD appt (cycle 5, day 1 assessments).  She said the oral contrast gives her loose  stools, and she needs to have the scans and then go straight home.  She said that she willing to come in on the next day for her cycle 5, day 1 on 02/04/12.  The pt was informed of her lipid panel results also.  The pt denies any new adverse events.  She said that she weeded her garden this morning, and she will run her errands this  afternoon.   Rn reminded the pt to call for any concerns.  The pt is aware of her cycle 4 appointments on 01/02/12.

## 2011-12-05 NOTE — Progress Notes (Signed)
Hematology and Oncology Follow Up Visit  Barbara Parks [id number] 08/24/1950 61 y.o. 12/05/2011    HPI: Barbara Parks is a 61 year old Barbara Parks woman with newly diagnosed metastatic ER positive breast carcinoma with involvement of the right pleura and pericardium. Pleurx catheter is still in place, followed by Dr. Laneta Simmers. She is participating in the [place] study, for which she had been taking letrozole 2.5 mg per day and Afinitor 10 mg per day.  2. History of prior urinary tract infection, resolved,     3. Grade 1 nonblanching/nonpruritic rash of the lower Extremities.  4. Grade 2 headache, though currently not symptomatic.  5. History of grade 2 diarrhea which has resolved after use of Imodium.  6. Grade 2 elevation of ALT.  7. Grade 1 elevation of trigylerides.  Interim History:   Barbara Parks is seen today in in followup for tolerance assessment of letrozole 2.5 mg per day given in combination with Afinitor 10 mg per day per the [facility]-4  study. Since her last office visit on 11/14/11, she's has been feeling great, except that she overdid yesterday, he had some very mild low-grade nausea without emesis this morning that last about 2 hours.  She has headaches, she's had no nausea, emesis, no diarrhea whatsoever. She denies any bleeding or bruising symptoms.  She denies any neuropathy symptoms. She has had no recurrent shortness of breath, cough, or chest pain. Her pedal edema has resolved. Her Pleurx catheter is continuing to decrease in the amount of fluid volume, and she states that she may have it removed on 12/10/2011.  A detailed review of systems is otherwise noncontributory as noted below.  Review of Systems: Constitutional: No fevers or chills, fatigue has improved significantly, she does have hot flashes fairly consistently but are not affecting her ADLs. Eyes: uses glasses ENT: 1 episode of mucositis which has since abated. Cardiovascular: As above. Respiratory:  Cough has pretty much abated except when her Pleurx catheter needs to be drained, occurring every 3-4 days. Neurological: no TIA or stroke symptoms Dermatological: negative Gastrointestinal:As above. Genito-Urinary: No complaints. Hematological and Lymphatic: negative Breast: negative Musculoskeletal: negative Remaining ROS negative.    Medications:   I have reviewed the patient's current medications.  Current Outpatient Prescriptions  Medication Sig Dispense Refill  . albuterol (PROVENTIL HFA;VENTOLIN HFA) 108 (90 BASE) MCG/ACT inhaler Inhale 2 puffs into the lungs every 6 (six) hours as needed. For shortness of breath      . atorvastatin (LIPITOR) 10 MG tablet Take 1 tablet (10 mg total) by mouth daily.  30 tablet  3  . chlorpheniramine-HYDROcodone (TUSSIONEX PENNKINETIC ER) 10-8 MG/5ML LQCR Take 5 mLs by mouth every 12 (twelve) hours.  140 mL  0  . Investigational everolimus (RAD001) 5 MG tablet Novartis [id number] Take 2 tablets by mouth daily. Take with a glass of water.  70 tablet  0  . Investigational everolimus (RAD001) 5 MG tablet Novartis [id number] Take 2 tablets by mouth daily. Take with a glass of water.  70 tablet  0  . letrozole (FEMARA) 2.5 MG tablet Take 1 tablet (2.5 mg total) by mouth daily.  30 tablet  0  . metoprolol tartrate (LOPRESSOR) 25 MG tablet Take 12.5 mg by mouth 1 day or 1 dose.       Marland Kitchen omeprazole (PRILOSEC) 20 MG capsule Take 20 mg by mouth daily.      . prochlorperazine (COMPAZINE) 10 MG tablet Take 10 mg by mouth every 6 (six) hours as needed.  Allergies:  Allergies  Allergen Reactions  . Aspirin     REACTION: upset stomach  . Latex Other (See Comments)    Blistering and skin peels off  . Nsaids Nausea And Vomiting    Extreme nausea and vomiting    Physical Exam: Filed Vitals:   12/05/11 1110  BP: 105/78  Pulse: 89  Temp:     Body mass index is 32.22 kg/(m^2). Weight: 193 lbs. HEENT:  Sclerae anicteric, conjunctivae pink.   Oropharynx clear.  No mucositis or candidiasis.  No evidence of thyromegaly. Nodes:  No cervical, supraclavicular, or axillary lymphadenopathy palpated. Breast Exam: Deferred.  Lungs:  Clear to auscultation bilaterally.  No crackles, rhonchi, or wheezes.   Heart:  Regular rate and rhythm, no murmurs rubs gallops or clicks appreciated. ABD: Soft, nontender without organomegaly. Normal to slightly hyperactive bowel sounds appreciated no distention. EXT:  No cyanosis, or ecchymoses. Rash has pretty much resolved. No evidence of pedal edema. NEURO: Alert and oriented x3 without any focal deficits.  ECOG: 0  Lab Results: Lab Results  Component Value Date   WBC 9.7 12/05/2011   HGB 13.6 12/05/2011   HCT 41.7 12/05/2011   MCV 80.0 12/05/2011   PLT 242 12/05/2011   NEUTROABS 7.8* 12/05/2011     Chemistry      Component Value Date/Time   NA 139 12/05/2011 1053   K 3.9 12/05/2011 1053   CL 104 12/05/2011 1053   CO2 26 12/05/2011 1053   BUN 12 12/05/2011 1053   CREATININE 0.85 12/05/2011 1053      Component Value Date/Time   CALCIUM 9.5 12/05/2011 1053   ALKPHOS 148* 12/05/2011 1053   AST 27 12/05/2011 1053   ALT 37* 12/05/2011 1053   BILITOT 0.2* 12/05/2011 1053      Lab Results  Component Value Date   LABCA2 117* 10/23/2011   Radiological data: CT CHEST, ABDOMEN AND PELVIS WITH CONTRAST  Technique: Contiguous axial images of the chest abdomen and pelvis were obtained after IV contrast administration.  Contrast: 100 ml Omnipaque-300  Comparison: Plain film chest of 10/22/2011. Prior CTs of  10/03/2011.  CT CHEST RECIST 1.0 target lesions:  - 7 mm right lower lobe lung nodule on image 37. 1.1 cm on the  prior.  - 1.1 cm subcarinal lymph node on image 28. 1.2 cm on the prior.  - Right hilar lymph node at 1.0 cm on image 26. 1.2 cm on the  prior.  Non measurable lesion: Similar small right pleural effusion.  Findings: Lung windows demonstrate similar mild soft tissue  thickening along the bronchus  intermedius and right lower lobe  lobar bronchus. Mild mass effect upon the right lower lobe  bronchus on image 27, without collapse. This appearance is not  significantly changed. Volume loss in the right middle lobe and right lower lobe. A superior segment right lower lobe lung nodule measures 4 mm on image 28 versus 6 mm on the prior.  Soft tissue windows demonstrate no supraclavicular adenopathy.  Right mastectomy and axillary nodal dissection. No axillary  adenopathy. Mild cardiomegaly, without pericardial effusion. No  left pleural effusion. No central pulmonary embolism, on this non-  dedicated study. A right paratracheal node measures 4 mm on image 13 versus 6 mm on the prior. No left hilar adenopathy. Tiny  hiatal hernia. No internal mammary adenopathy. Right-sided  pleurex catheter remains in place. Subtle sclerosis and irregularity of the left posteromedial 11th rib on image 46 of series 2. This is similar  to on the prior exam, but new since 08/09/2010 study.  IMPRESSION:  1. Response to therapy of pulmonary nodules and decreased size of presumably metastatic thoracic nodes.  2. No new or progressive disease.  3. Similar small right pleural effusion.  4. Similar nonspecific mild bronchial wall thickening to the right  lung base.  5. Similar area of sclerosis and irregularity involving the left  posterior 11th rib. Correlate with prior trauma within this area.  Metastatic disease cannot be excluded.  CT ABDOMEN AND PELVIS  Findings: Mild hepatic steatosis. No focal liver lesion. Normal  spleen, stomach. A transverse duodenal diverticulum on image 65. Normal pancreas, gallbladder, biliary tract, right adrenal gland.  Minimal left adrenal nodularity is present on 08/09/2010. May be  slightly increased since the 2005 exam. Image 59 today.  Normal kidneys. No retroperitoneal or retrocrural adenopathy.  Normal colon, appendix, and terminal ileum. No evidence of omental or peritoneal  disease. Normal small bowel without abdominal ascites.  No pelvic adenopathy. Normal urinary bladder and uterus. No  adnexal mass. No significant free fluid. Degenerative changes of  the left hip.  IMPRESSION:  1. No acute process or evidence of metastatic disease in the  abdomen or pelvis.  2. Subtle left adrenal nodularity. This warrants followup  attention.  Original Report Authenticated By: Consuello Bossier, M.D.  Assessment:  Mrs. Belzer is a 61 year old Barbara Parks woman with newly diagnosed metastatic ER positive breast carcinoma with involvement of the right pleura and pericardium. Pleurx catheter is still in place, followed by Dr. Laneta Simmers. She is participating in the [place] study, for which she had been taking letrozole 2.5 mg per day and Afinitor 10 mg per day with evidence of a 20% decrease in tumor burden per recent restaging studies.  2. History of prior urinary tract infection, resolved,     2.a Grade 1 hematuria.  3. Grade 1 nonblanching/nonpruritic rash of the lower extremities, persistent.  4. Grade 2 headache, though currently not symptomatic.  5. History of grade 2 diarrhea which has resolved after use of Imodium.  6. Grade 2 elevation of ALT, resolved.  7. Grade 1 elevation of trigylerides, pending results today, will continue Lipitor 10mg  po daily.  Case to be reviewed with Dr. Pierce Crane.   Plan:  Almyra will continue Afinitor at 10mg  po daily, letrozole 2.5mg  po daily, and Lipitor 10mg  po daily.  Latecia will return on 01/02/2012 for followup, with appropriate protocol base labs prior.  This plan was reviewed with the patient, who voices understanding and agreement.  She knows to call with any changes or problems.    Osmond Steckman T, PA-C 12/05/2011

## 2011-12-05 NOTE — Telephone Encounter (Signed)
per timing that the patient needs to be fasting for the testing per the research nurse moved patient to 01-03-2012 starting at 10:15am

## 2011-12-06 ENCOUNTER — Other Ambulatory Visit: Payer: Self-pay | Admitting: *Deleted

## 2011-12-06 DIAGNOSIS — C50919 Malignant neoplasm of unspecified site of unspecified female breast: Secondary | ICD-10-CM

## 2011-12-10 ENCOUNTER — Encounter: Payer: Self-pay | Admitting: Surgery

## 2011-12-12 ENCOUNTER — Telehealth: Payer: Self-pay | Admitting: *Deleted

## 2011-12-12 NOTE — Telephone Encounter (Signed)
gave patient appointments for 02-03-2012 ct scan arrival time is 11:15am

## 2011-12-17 ENCOUNTER — Encounter: Payer: Self-pay | Admitting: Surgery

## 2011-12-17 ENCOUNTER — Ambulatory Visit (INDEPENDENT_AMBULATORY_CARE_PROVIDER_SITE_OTHER): Payer: Self-pay | Admitting: Surgery

## 2011-12-17 VITALS — BP 144/83 | HR 106 | Resp 20 | Ht 65.0 in | Wt 198.0 lb

## 2011-12-17 DIAGNOSIS — Z9889 Other specified postprocedural states: Secondary | ICD-10-CM

## 2011-12-17 DIAGNOSIS — I319 Disease of pericardium, unspecified: Secondary | ICD-10-CM

## 2011-12-17 DIAGNOSIS — J910 Malignant pleural effusion: Secondary | ICD-10-CM

## 2011-12-17 NOTE — Progress Notes (Signed)
                  [address]            [place] [address]          [phone]      HPI:  Patient returns for follow-up having undergone subxiphoid pericardial window and insertion of a right Pleurx catheter on 09/11/2011 for newly diagnosed metastatic ER positive breast carcinoma with involvement of the right pleura and pericardium. Since I last saw her the Pleurx catheter is draining about 50-75 cc of fluid every 4 days. A recent CT scan the chest showed minimal residual pleural effusion present. The metastases appear to have responded to the chemotherapy.  Current Outpatient Prescriptions   Medication  Sig  Dispense  Refill   .  albuterol (PROVENTIL HFA;VENTOLIN HFA) 108 (90 BASE) MCG/ACT inhaler  Inhale 2 puffs into the lungs every 6 (six) hours as needed. For shortness of breath     .  atorvastatin (LIPITOR) 10 MG tablet  Take 1 tablet (10 mg total) by mouth daily.  30 tablet  3   .  Investigational everolimus (RAD001) 5 MG tablet Novartis [id number]  Take 2 tablets by mouth daily. Take with a glass of water.  70 tablet  0   .  Investigational everolimus (RAD001) 5 MG tablet Novartis [id number]  Take 2 tablets by mouth daily. Take with a glass of water.  70 tablet  0   .  Investigational everolimus (RAD001) 5 MG tablet Novartis [id number]  Take 2 tablets by mouth daily. Take with a glass of water.  70 tablet  0   .  letrozole (FEMARA) 2.5 MG tablet  Take 1 tablet (2.5 mg total) by mouth daily.  30 tablet  0   .  metoprolol tartrate (LOPRESSOR) 25 MG tablet  Take 12.5 mg by mouth 1 day or 1 dose.     .  omeprazole (PRILOSEC) 20 MG capsule  Take 20 mg by mouth as needed.     .  prochlorperazine (COMPAZINE) 10 MG tablet  Take 10 mg by mouth every 6 (six) hours as needed.      Physical Exam:  BP 144/83  Pulse 106  Resp 20  Ht 5' 5" (1.651 m)  Wt 198 lb (89.812 kg)  BMI 32.95 kg/m2  SpO2 96%  She looks well.  Lung exam is clear.  The Pleurx catheter is in  place. There is mild erythema around the site.  Diagnostic Tests:  Clinical Data: Breast cancer with lung metastasis. Restaging  after chemotherapy. Cough. Short of breath with exertion.  CT CHEST, ABDOMEN AND PELVIS WITH CONTRAST  Technique: Contiguous axial images of the chest abdomen and pelvis  were obtained after IV contrast administration.  Contrast: 100 ml Omnipaque-300  Comparison: Plain film chest of 10/22/2011. Prior CTs of  10/03/2011.  CT CHEST  RECIST 1.0 target lesions:  - 7 mm right lower lobe lung nodule on image 37. 1.1 cm on the  prior.  - 1.1 cm subcarinal lymph node on image 28. 1.2 cm on the prior.  - Right hilar lymph node at 1.0 cm on image 26. 1.2 cm on the  prior.  Non measurable lesion: Similar small right pleural effusion.  Findings: Lung windows demonstrate similar mild soft tissue  thickening along the bronchus intermedius and right lower lobe  lobar bronchus. Mild mass effect upon the right lower lobe  bronchus on image 27, without collapse.   This appearance is not  significantly changed.  Volume loss in the right middle lobe and right lower lobe.  A superior segment right lower lobe lung nodule measures 4 mm on  image 28 versus 6 mm on the prior.  Soft tissue windows demonstrate no supraclavicular adenopathy.  Right mastectomy and axillary nodal dissection. No axillary  adenopathy. Mild cardiomegaly, without pericardial effusion. No  left pleural effusion. No central pulmonary embolism, on this non-  dedicated study. A right paratracheal node measures 4 mm on image  13 versus 6 mm on the prior. No left hilar adenopathy. Tiny  hiatal hernia. No internal mammary adenopathy. Right-sided  pleurex catheter remains in place.  Subtle sclerosis and irregularity of the left posteromedial 11th  rib on image 46 of series 2. This is similar to on the prior exam,  but new since 08/09/2010 study.  IMPRESSION:  1. Response to therapy of pulmonary nodules and  decreased size of  presumably metastatic thoracic nodes.  2. No new or progressive disease.  3. Similar small right pleural effusion.  4. Similar nonspecific mild bronchial wall thickening to the right  lung base.  5. Similar area of sclerosis and irregularity involving the left  posterior 11th rib. Correlate with prior trauma within this area.  Metastatic disease cannot be excluded.  CT ABDOMEN AND PELVIS  Findings: Mild hepatic steatosis. No focal liver lesion. Normal  spleen, stomach. A transverse duodenal diverticulum on image 65.  Normal pancreas, gallbladder, biliary tract, right adrenal gland.  Minimal left adrenal nodularity is present on 08/09/2010. May be  slightly increased since the 2005 exam. Image 59 today.  Normal kidneys. No retroperitoneal or retrocrural adenopathy.  Normal colon, appendix, and terminal ileum. No evidence of omental  or peritoneal disease. Normal small bowel without abdominal  ascites.  No pelvic adenopathy. Normal urinary bladder and uterus. No  adnexal mass. No significant free fluid. Degenerative changes of  the left hip.  IMPRESSION:  1. No acute process or evidence of metastatic disease in the  abdomen or pelvis.  2. Subtle left adrenal nodularity. This warrants followup  attention.  Original Report Authenticated By: KYLE D. TALBOT, M.D.  Impression:  I think the Pleurx catheter can be removed at this point.  Plan:  Will schedule her for removal in short stay this Thursday.   

## 2011-12-18 ENCOUNTER — Other Ambulatory Visit: Payer: Self-pay

## 2011-12-18 ENCOUNTER — Encounter (HOSPITAL_COMMUNITY): Payer: Self-pay | Admitting: Pharmacy Technician

## 2011-12-18 DIAGNOSIS — J90 Pleural effusion, not elsewhere classified: Secondary | ICD-10-CM

## 2011-12-19 ENCOUNTER — Encounter (HOSPITAL_COMMUNITY)
Admission: RE | Disposition: A | Discharge status: Home / Self Care | Payer: Self-pay | Source: Ambulatory Visit | Attending: Surgery

## 2011-12-19 ENCOUNTER — Ambulatory Visit (HOSPITAL_COMMUNITY)
Admission: RE | Admit: 2011-12-19 | Discharge: 2011-12-19 | Disposition: A | Discharge status: Home / Self Care | Payer: Self-pay | Source: Ambulatory Visit | Attending: Surgery | Admitting: Surgery

## 2011-12-19 ENCOUNTER — Encounter (HOSPITAL_COMMUNITY): Payer: Self-pay | Admitting: *Deleted

## 2011-12-19 DIAGNOSIS — J90 Pleural effusion, not elsewhere classified: Secondary | ICD-10-CM | POA: Insufficient documentation

## 2011-12-19 HISTORY — PX: REMOVAL OF PLEURAL DRAINAGE CATHETER: SHX5080

## 2011-12-19 SURGERY — REMOVAL, CLOSED DRAINAGE CATHETER SYSTEM, PLEURAL
Anesthesia: LOCAL | Site: Chest | Laterality: Right | Wound class: Clean Contaminated

## 2011-12-19 MED ORDER — LIDOCAINE HCL (PF) 1 % IJ SOLN
INTRAMUSCULAR | Status: AC
  Filled 2011-12-19: qty 5

## 2011-12-19 NOTE — Brief Op Note (Signed)
12/19/2011  6:44 PM  PATIENT:  Barbara Parks  61 y.o. female  PRE-OPERATIVE DIAGNOSIS:  Malignant PLEURAL EFFUSION  POST-OPERATIVE DIAGNOSIS:  Same  PROCEDURE:  Procedure(s) (LRB): REMOVAL OF PLEURAL DRAINAGE CATHETER (Right)  SURGEON:  Surgeon(s) and Role:    * Alleen Borne, MD - Primary  PHYSICIAN ASSISTANT: none  ASSISTANTS: none   ANESTHESIA:   local  EBL:     BLOOD ADMINISTERED:none  DRAINS: none   LOCAL MEDICATIONS USED:  XYLOCAINE   SPECIMEN:  No Specimen  DISPOSITION OF SPECIMEN:  N/A  COUNTS:  YES  Discharged to home.

## 2011-12-19 NOTE — Discharge Instructions (Signed)
To remove dsg later today.

## 2011-12-20 ENCOUNTER — Encounter (HOSPITAL_COMMUNITY): Payer: Self-pay

## 2011-12-20 NOTE — Op Note (Signed)
Barbara Parks, Barbara Parks                ACCOUNT NO.:  [id number]  MEDICAL RECORD NO.:  [id number]  LOCATION:  [facility]                         FACILITY:  [facility]  PHYSICIAN:  Evelene Croon, M.D.     DATE OF BIRTH:  10-Apr-1951  DATE OF PROCEDURE:  12/19/2011 DATE OF DISCHARGE:  12/19/2011                              OPERATIVE REPORT   PREOPERATIVE DIAGNOSIS:  Malignant right pleural effusion with PleurX catheter in place.  POSTOPERATIVE DIAGNOSIS:  Malignant right pleural effusion with PleurX catheter in place.  PROCEDURE:  Removal of right pleural catheter.  SURGEON:  Evelene Croon, MD  ANESTHESIA:  1% Xylocaine.  CLINICAL HISTORY:  This patient is a 61 year old woman who has metastatic breast cancer and underwent insertion of a right PleurX catheter from malignant right pleural effusion as well as subxiphoid pericardial window for malignant pericardial effusion by me several months ago.  She has been undergoing chemotherapy.  The drainage from the PleurX catheter has recently decreased to about 50-75 mL every 4 days.  She had a followup CT scan that showed response of her tumor to chemotherapy.  There was no significant right pleural effusion present with PleurX catheter in place.  It was felt that it was time to remove the catheter.  I discussed the procedure with the patient including alternatives, benefits, and risks including but not limited to bleeding, infection, and recurrence of the effusion.  She understood all this and agreed to proceed.  The patient was seen in short-stay section A.  Her consent form was signed.  Proper patient and proper operation were confirmed with the patient and after reviewing her chart.  The right-sided chest was prepped with Betadine solution and draped in usual sterile manner.  Time- out was taken.  Proper patient and proper operation were confirmed with nursing staff.  Then, 1% lidocaine local anesthesia was used to anesthetize around the catheter  exit site.  Then, with gentle traction on the catheter, a hemostat was used to separate the cuff from the subcutaneous tissue.  The catheter was very adherent to the subcutaneous tissue since that had been in for a long time.  I had to instill further 1% lidocaine.  It was somewhat uncomfortable for the patient.  After completely spreading the subcutaneous tissue away from the cuff, I was able to remove the entire catheter.  There was no bleeding.  A fasting gauze dressings followed by dry sterile gauze 4x4 dressing was applied over the exit site.  The patient tolerated the procedure well and was discharged home.    Evelene Croon, M.D.    BB/MEDQ  D:  12/19/2011  T:  12/20/2011  Job:  [id number]

## 2011-12-24 ENCOUNTER — Encounter (HOSPITAL_COMMUNITY): Payer: Self-pay | Admitting: Surgery

## 2011-12-31 NOTE — H&P (Signed)
[address]            Barbara Parks [address]          [phone]      HPI:  Patient returns for follow-up having undergone subxiphoid pericardial window and insertion of a right Pleurx catheter on 09/11/2011 for newly diagnosed metastatic ER positive breast carcinoma with involvement of the right pleura and pericardium. Since I last saw her the Pleurx catheter is draining about 50-75 cc of fluid every 4 days. A recent CT scan the chest showed minimal residual pleural effusion present. The metastases appear to have responded to the chemotherapy.  Current Outpatient Prescriptions   Medication  Sig  Dispense  Refill   .  albuterol (PROVENTIL HFA;VENTOLIN HFA) 108 (90 BASE) MCG/ACT inhaler  Inhale 2 puffs into the lungs every 6 (six) hours as needed. For shortness of breath     .  atorvastatin (LIPITOR) 10 MG tablet  Take 1 tablet (10 mg total) by mouth daily.  30 tablet  3   .  Investigational everolimus (RAD001) 5 MG tablet Novartis [id number]  Take 2 tablets by mouth daily. Take with a glass of water.  70 tablet  0   .  Investigational everolimus (RAD001) 5 MG tablet Novartis [id number]  Take 2 tablets by mouth daily. Take with a glass of water.  70 tablet  0   .  Investigational everolimus (RAD001) 5 MG tablet Novartis [id number]  Take 2 tablets by mouth daily. Take with a glass of water.  70 tablet  0   .  letrozole (FEMARA) 2.5 MG tablet  Take 1 tablet (2.5 mg total) by mouth daily.  30 tablet  0   .  metoprolol tartrate (LOPRESSOR) 25 MG tablet  Take 12.5 mg by mouth 1 day or 1 dose.     Marland Kitchen  omeprazole (PRILOSEC) 20 MG capsule  Take 20 mg by mouth as needed.     .  prochlorperazine (COMPAZINE) 10 MG tablet  Take 10 mg by mouth every 6 (six) hours as needed.      Physical Exam:  BP 144/83  Pulse 106  Resp 20  Ht 5\' 5"  (1.651 m)  Wt 198 lb (89.812 kg)  BMI 32.95 kg/m2  SpO2 96%  She looks well.  Lung exam is clear.  The Pleurx catheter is in  place. There is mild erythema around the site.  Diagnostic Tests:  Clinical Data: Breast cancer with lung metastasis. Restaging  after chemotherapy. Cough. Short of breath with exertion.  CT CHEST, ABDOMEN AND PELVIS WITH CONTRAST  Technique: Contiguous axial images of the chest abdomen and pelvis  were obtained after IV contrast administration.  Contrast: 100 ml Omnipaque-300  Comparison: Plain film chest of 10/22/2011. Prior CTs of  10/03/2011.  CT CHEST  RECIST 1.0 target lesions:  - 7 mm right lower lobe lung nodule on image 37. 1.1 cm on the  prior.  - 1.1 cm subcarinal lymph node on image 28. 1.2 cm on the prior.  - Right hilar lymph node at 1.0 cm on image 26. 1.2 cm on the  prior.  Non measurable lesion: Similar small right pleural effusion.  Findings: Lung windows demonstrate similar mild soft tissue  thickening along the bronchus intermedius and right lower lobe  lobar bronchus. Mild mass effect upon the right lower lobe  bronchus on image 27, without collapse.  This appearance is not  significantly changed.  Volume loss in the right middle lobe and right lower lobe.  A superior segment right lower lobe lung nodule measures 4 mm on  image 28 versus 6 mm on the prior.  Soft tissue windows demonstrate no supraclavicular adenopathy.  Right mastectomy and axillary nodal dissection. No axillary  adenopathy. Mild cardiomegaly, without pericardial effusion. No  left pleural effusion. No central pulmonary embolism, on this non-  dedicated study. A right paratracheal node measures 4 mm on image  13 versus 6 mm on the prior. No left hilar adenopathy. Tiny  hiatal hernia. No internal mammary adenopathy. Right-sided  pleurex catheter remains in place.  Subtle sclerosis and irregularity of the left posteromedial 11th  rib on image 46 of series 2. This is similar to on the prior exam,  but new since 08/09/2010 study.  IMPRESSION:  1. Response to therapy of pulmonary nodules and  decreased size of  presumably metastatic thoracic nodes.  2. No new or progressive disease.  3. Similar small right pleural effusion.  4. Similar nonspecific mild bronchial wall thickening to the right  lung base.  5. Similar area of sclerosis and irregularity involving the left  posterior 11th rib. Correlate with prior trauma within this area.  Metastatic disease cannot be excluded.  CT ABDOMEN AND PELVIS  Findings: Mild hepatic steatosis. No focal liver lesion. Normal  spleen, stomach. A transverse duodenal diverticulum on image 65.  Normal pancreas, gallbladder, biliary tract, right adrenal gland.  Minimal left adrenal nodularity is present on 08/09/2010. May be  slightly increased since the 2005 exam. Image 59 today.  Normal kidneys. No retroperitoneal or retrocrural adenopathy.  Normal colon, appendix, and terminal ileum. No evidence of omental  or peritoneal disease. Normal small bowel without abdominal  ascites.  No pelvic adenopathy. Normal urinary bladder and uterus. No  adnexal mass. No significant free fluid. Degenerative changes of  the left hip.  IMPRESSION:  1. No acute process or evidence of metastatic disease in the  abdomen or pelvis.  2. Subtle left adrenal nodularity. This warrants followup  attention.  Original Report Authenticated By: Consuello Bossier, M.D.  Impression:  I think the Pleurx catheter can be removed at this point.  Plan:  Will schedule her for removal in short stay this Thursday.

## 2012-01-01 ENCOUNTER — Telehealth: Payer: Self-pay | Admitting: *Deleted

## 2012-01-01 ENCOUNTER — Other Ambulatory Visit: Payer: Self-pay | Admitting: *Deleted

## 2012-01-01 DIAGNOSIS — C50919 Malignant neoplasm of unspecified site of unspecified female breast: Secondary | ICD-10-CM

## 2012-01-01 NOTE — Telephone Encounter (Signed)
spoke with from the research nurse and she confirmed the date and time according the [facility] the research nurse patient is aware of the date and times of her appointment on 01-02-2012 11:15am labs and 1:45pm with christine schere

## 2012-01-01 NOTE — Telephone Encounter (Signed)
spoke with dr.rubin on 01-01-2012 because he schedule is booked solid the research patient can be placed on Carmela Rima schedule for 01-02-2012 because patient Barbara Parks needs to be seen after her pet scan on 01-06-2012

## 2012-01-02 ENCOUNTER — Other Ambulatory Visit (HOSPITAL_BASED_OUTPATIENT_CLINIC_OR_DEPARTMENT_OTHER): Payer: 59 | Admitting: Lab

## 2012-01-02 ENCOUNTER — Other Ambulatory Visit: Payer: Self-pay | Admitting: Oncology

## 2012-01-02 ENCOUNTER — Encounter: Payer: Self-pay | Admitting: *Deleted

## 2012-01-02 ENCOUNTER — Other Ambulatory Visit: Payer: Self-pay | Admitting: *Deleted

## 2012-01-02 ENCOUNTER — Other Ambulatory Visit: Payer: Self-pay | Admitting: Lab

## 2012-01-02 ENCOUNTER — Ambulatory Visit: Payer: Self-pay | Admitting: Physician Assistant

## 2012-01-02 ENCOUNTER — Encounter: Payer: Self-pay | Admitting: Physician Assistant

## 2012-01-02 ENCOUNTER — Ambulatory Visit (HOSPITAL_BASED_OUTPATIENT_CLINIC_OR_DEPARTMENT_OTHER): Payer: 59 | Admitting: Physician Assistant

## 2012-01-02 VITALS — BP 110/77 | HR 99 | Temp 98.0°F | Ht 65.0 in | Wt 197.2 lb

## 2012-01-02 DIAGNOSIS — Z170 Estrogen receptor positive status [ER+]: Secondary | ICD-10-CM

## 2012-01-02 DIAGNOSIS — C50919 Malignant neoplasm of unspecified site of unspecified female breast: Secondary | ICD-10-CM

## 2012-01-02 DIAGNOSIS — C782 Secondary malignant neoplasm of pleura: Secondary | ICD-10-CM

## 2012-01-02 LAB — COMPREHENSIVE METABOLIC PANEL
ALT: 77 U/L — ABNORMAL HIGH (ref 0–35)
AST: 52 U/L — ABNORMAL HIGH (ref 0–37)
Albumin: 3.4 g/dL — ABNORMAL LOW (ref 3.5–5.2)
Calcium: 9.4 mg/dL (ref 8.4–10.5)
Chloride: 104 mEq/L (ref 96–112)
Potassium: 3.7 mEq/L (ref 3.5–5.3)

## 2012-01-02 LAB — CBC WITH DIFFERENTIAL/PLATELET
BASO%: 0.6 % (ref 0.0–2.0)
HCT: 38.2 % (ref 34.8–46.6)
MCHC: 32.8 g/dL (ref 31.5–36.0)
MONO#: 0.7 10*3/uL (ref 0.1–0.9)
NEUT#: 6 10*3/uL (ref 1.5–6.5)
NEUT%: 72.8 % (ref 38.4–76.8)
RBC: 4.94 10*6/uL (ref 3.70–5.45)
WBC: 8.3 10*3/uL (ref 3.9–10.3)
lymph#: 1.3 10*3/uL (ref 0.9–3.3)
nRBC: 0 % (ref 0–0)

## 2012-01-02 LAB — LIPID PANEL

## 2012-01-02 LAB — BILIRUBIN, DIRECT: Bilirubin, Direct: 0.1 mg/dL (ref 0.0–0.3)

## 2012-01-02 MED ORDER — LETROZOLE 2.5 MG PO TABS: 2.5000 mg | ORAL_TABLET | Freq: Every day | ORAL | Status: DC

## 2012-01-02 MED ORDER — INV-EVEROLIMUS (RAD0001) 5MG TABLET NOVARTIS CRAD001Y24135: 2.0000 | ORAL_TABLET | Freq: Every day | ORAL | Status: DC

## 2012-01-02 NOTE — Progress Notes (Signed)
01/02/12 at 11:51am- [place] 4/ Cycle 4, day 1 study notes-  The pt was into the cancer center this am for her fasting labs.  She confirmed that she has been fasting at least 9 hours before her lab draw this am.  The pt said that she forgot her study drug pills.  She said that she had them on the kitchen table "all ready" and just walked out the door without them.  The pt said that it is a 40 minute drive here, and she simply could not make an extra trip homr today.  She said that she would return the study drug tomorrow.  The research nurse agreed that the pt could return them later.  The pt was given her July appts.  The pt confirmed that she had her pleurix catheter removed recently.  The pt said that she wanted to eat after her labs were drawn, and then she will return to the cancer center for her 1:45pm physical exam with Debbora Presto, PA.    01/02/12 at 2:35pm - The pt was back to the clinic to see Dr. Renelda Loma PA for her history and physical exam.  The pt said she had a nice lunch.  The pt also reports a great appetite.  She states she has had intermittent mild nausea since Tuesday, 12/31/11.  She has take compazine as needed for this mild nausea.  She also reports 1 episode of grade 1 diarrhea on Monday, 12/30/11.  She said she took an Immodium and the diarrhea resolved by 12/31/11.  She states she still has the following adverse events:  fatigue, hot flashes,and memory impairment.  She states her headaches have resolved.  Her lower extremity rash has now resolved.  Her lipid panel is still pending at this time.  The pt reports that she is continuing to take Lipitor.  The pt states she is no longer taking the following medications:  albuterol and oxycodon -acetaminophen.  The pt reports she is continuing to take the following medications:  Tussionex, Lopressor, Prilosec, Compazine and Immodium.  She denies any mouth sores.  The pt states she is feeling great and doing a lot of bass fishing.  She is performing all of  her usual activities.  ECOG=0.  The pt was seen and examined by Debbora Presto, PA today.  Her BP was taken after the pt was seated for 5 minutes.  Her BP was taken at 3 times in 1-2 minute intervals.  The pt's weight is stable.  The pt will return her cycle 3 study drug everolimus drug kit tomorrow for drug accountability purposes.  The pt was given 3 sample bottles of letrozole for self administration.  She was also dispensed her cycle 4 everolimus drug kit.  The pt was instructed to keep taking 2 pills of everolimus and 1 letrozole daily.  The pt verbalized understanding.    01/03/12 at 9:38am- The pt returned her cycle 3 everolimus kit along with her letrozole bottle (13 pills remaining).  The pt reported that she took her study drugs everyday from 12/05/11 through 01/02/12 (29 days in total).  The pt returned 12 everolimus pills (unopened).  Therefore, the pt has been 100% compliant with her study drug, everolimus (70- (29x2)=12). The study drugs were all taken to the pharmacy for drug accountability and storage/disposal.  The pt confirmed that she will begin her cycle 4 everolimus kit today.  The pt's lipid panel is back, and the pt still has an elevated triglyceride level (ongoing  grade 2 toxicity).  The research nurse will discuss with Dr. Donnie Coffin.    01/03/12 at 10:43am- Rn spoke to Dr. Donnie Coffin, Trudie Buckler (monitor), and Isidoro Donning (study coordinator) regarding the pt's increasing triglycerides.  Dr. Donnie Coffin said that he wanted to add Lopid 600 mg bid for her.  The research nurse contacted Jill Side in the pharmacy to ensure that there is no drug interaction with everolimus and Lopid. The research nurse also left a message with the pt at home to call the nurse so that the nurse can explain why this new medication is being added.  Will await Colleen's and pt's return call.    01/03/12 at 2:23pm- The pharmacy and Dr. Donnie Coffin both stated the pt must stop taking her Lipitor when she starts the Lopid.  The pt was contacted,  and she understands about the increased risk of rhabdomyolosis if she takes the 2 drugs together.  She states that she has not taken any Lipitor today.  She states she will pick up the new prescription on Saturday and start taking it as prescribed.  The pt knows that her triglycerides are elevated and this new medication is to lower her triglycerides.    01/06/12 at 10:31am - The pt called the research nurse this am to let her that she started her Lopid on 01/05/12.  She said that she got the prescription late on Saturday and just wanted to start it on Sunday.  The pt also confirmed that she is not taking Lipitor.  She said that her actual last dose of Lipitor was on 01/02/12.  She denies any problems or concerns.  The pt is aware of her future appts.

## 2012-01-02 NOTE — Progress Notes (Signed)
Hematology and Oncology Follow Up Visit  Barbara Parks [id number] 10-30-1950 61 y.o. 01/02/2012    HPI: Barbara Parks is a 61 year old [name] with newly diagnosed metastatic ER positive breast carcinoma with involvement of the right pleura and pericardium. Pleurx catheter is still in place, followed by Dr. Laneta Simmers. She is participating in the [place] study, for which she had been taking letrozole 2.5 mg per day and Afinitor 10 mg per day.  Due to initiate day 1 cycle 4 today.  2. History of prior urinary tract infection, resolved,     3. Grade 1 nonblanching/nonpruritic rash of the lower extremities.  4. Grade 2 headache, though currently not symptomatic.  5. History of grade 2 diarrhea which has resolved after use of Imodium.  6. Grade 2 elevation of ALT.  7. Grade 1 elevation of trigylerides.  Interim History:   Barbara Parks is seen today in in followup for tolerance assessment of letrozole 2.5 mg per day given in combination with Afinitor 10 mg per day per the BOLERO-4  study. She is due to initiate day 1 cycle 4 today. Since her last office visit on 12/05/11, she's has been feeling great, and underwent her Pleurx catheter removal by Dr. Laneta Simmers on 12/19/2011.  She had some very mild low-grade nausea since 12/31/2011, without emesis.  She has no headaches recently.  She did experience diarrhea from 12/30/2011 into early 12/31/2011, and Imodium resolve the symptomatology. She denies any bleeding or bruising symptoms.  She denies any neuropathy symptoms. She has had no recurrent shortness of breath, cough, or chest pain. Her pedal edema has resolved.  A detailed review of systems is otherwise noncontributory as noted below.  Review of Systems: Constitutional: No fevers or chills, fatigue has improved significantly, she does have hot flashes fairly consistently but are not affecting her ADLs. Eyes: uses glasses ENT: 1 episode of mucositis which has since  abated. Cardiovascular: As above. Respiratory: Cough has pretty much abated except when her Pleurx catheter needs to be drained, occurring every 3-4 days. Neurological: no TIA or stroke symptoms Dermatological: negative Gastrointestinal:As above. Genito-Urinary: No complaints. Hematological and Lymphatic: negative Breast: negative Musculoskeletal: negative Remaining ROS negative.    Medications:   I have reviewed the patient's current medications.  Current Outpatient Prescriptions  Medication Sig Dispense Refill  . atorvastatin (LIPITOR) 10 MG tablet Take 10 mg by mouth daily.      . Hydrocortisone (CORTIZONE-10 EX) Apply 1 application topically 2 (two) times daily as needed. For eczema on hands      . ibuprofen (ADVIL,MOTRIN) 200 MG tablet Take 400 mg by mouth every 6 (six) hours as needed. For pain      . Investigational everolimus (RAD001) 5 MG tablet Novartis [id number] Take 2 tablets by mouth daily. Take with a glass of water.      Marland Kitchen letrozole (FEMARA) 2.5 MG tablet Take 2.5 mg by mouth daily.      . metoprolol tartrate (LOPRESSOR) 25 MG tablet Take 12.5 mg by mouth daily.       Marland Kitchen omeprazole (PRILOSEC) 20 MG capsule Take 20 mg by mouth daily as needed. For reflux      . prochlorperazine (COMPAZINE) 10 MG tablet Take 10 mg by mouth every 6 (six) hours as needed. For nausea        Allergies:  Allergies  Allergen Reactions  . Aspirin     REACTION: upset stomach  . Latex Other (See Comments)    Blistering and skin peels  off  . Nsaids Nausea And Vomiting    Extreme nausea and vomiting    Physical Exam: Filed Vitals:   01/02/12 1353  BP: 110/77  Pulse: 99  Temp:     Body mass index is 32.82 kg/(m^2). Weight: 197 lbs. HEENT:  Sclerae anicteric, conjunctivae pink.  Oropharynx clear.  No mucositis or candidiasis.  No evidence of thyromegaly. Nodes:  No cervical, supraclavicular, or axillary lymphadenopathy palpated. Breast Exam: Deferred.  Lungs:  Clear to  auscultation bilaterally.  No crackles, rhonchi, or wheezes.   Heart:  Regular rate and rhythm, no murmurs rubs gallops or clicks appreciated. ABD: Soft, nontender without organomegaly. Normal to slightly hyperactive bowel sounds appreciated no distention. EXT:  No cyanosis, or ecchymoses. No rash is evident. No evidence of pedal edema. NEURO: Alert and oriented x3 without any focal deficits.  ECOG: 0  Lab Results: Lab Results  Component Value Date   WBC 8.3 01/02/2012   HGB 12.5 01/02/2012   HCT 38.2 01/02/2012   MCV 77.2* 01/02/2012   PLT 249 01/02/2012   NEUTROABS 6.0 01/02/2012     Chemistry      Component Value Date/Time   NA 138 01/02/2012 1114   K 3.7 01/02/2012 1114   CL 104 01/02/2012 1114   CO2 23 01/02/2012 1114   BUN 12 01/02/2012 1114   CREATININE 0.74 01/02/2012 1114      Component Value Date/Time   CALCIUM 9.4 01/02/2012 1114   ALKPHOS 182* 01/02/2012 1114   AST 52* 01/02/2012 1114   ALT 77* 01/02/2012 1114   BILITOT 0.3 01/02/2012 1114      Lab Results  Component Value Date   LABCA2 117* 10/23/2011   Radiological data: CT CHEST, ABDOMEN AND PELVIS WITH CONTRAST  Technique: Contiguous axial images of the chest abdomen and pelvis were obtained after IV contrast administration.  Contrast: 100 ml Omnipaque-300  Comparison: Plain film chest of 10/22/2011. Prior CTs of  10/03/2011.  CT CHEST RECIST 1.0 target lesions:  - 7 mm right lower lobe lung nodule on image 37. 1.1 cm on the  prior.  - 1.1 cm subcarinal lymph node on image 28. 1.2 cm on the prior.  - Right hilar lymph node at 1.0 cm on image 26. 1.2 cm on the  prior.  Non measurable lesion: Similar small right pleural effusion.  Findings: Lung windows demonstrate similar mild soft tissue  thickening along the bronchus intermedius and right lower lobe lobar bronchus. Mild mass effect upon the right lower lobe bronchus on image 27, without collapse. This appearance is not significantly changed. Volume loss in the right middle lobe  and right lower lobe. A superior segment right lower lobe lung nodule measures 4 mm on image 28 versus 6 mm on the prior. Soft tissue windows demonstrate no supraclavicular adenopathy. Right mastectomy and axillary nodal dissection. No axillary adenopathy. Mild cardiomegaly, without pericardial effusion. No left pleural effusion. No central pulmonary embolism, on this non- dedicated study. A right paratracheal node measures 4 mm on image 13 versus 6 mm on the prior. No left hilar adenopathy. Tiny hiatal hernia. No internal mammary adenopathy. Right-sided pleurex catheter remains in place. Subtle sclerosis and irregularity of the left posteromedial 11th rib on image 46 of series 2. This is similar to on the prior exam, but new since 08/09/2010 study.  IMPRESSION:  1. Response to therapy of pulmonary nodules and decreased size of presumably metastatic thoracic nodes.  2. No new or progressive disease.  3. Similar small right  pleural effusion.  4. Similar nonspecific mild bronchial wall thickening to the right lung base.  5. Similar area of sclerosis and irregularity involving the left  posterior 11th rib. Correlate with prior trauma within this area.  Metastatic disease cannot be excluded.  CT ABDOMEN AND PELVIS  Findings: Mild hepatic steatosis. No focal liver lesion. Normal  spleen, stomach. A transverse duodenal diverticulum on image 65. Normal pancreas, gallbladder, biliary tract, right adrenal gland. Minimal left adrenal nodularity is present on 08/09/2010. May be slightly increased since the 2005 exam. Image 59 today. Normal kidneys. No retroperitoneal or retrocrural adenopathy. Normal colon, appendix, and terminal ileum. No evidence of omental or peritoneal disease. Normal small bowel without abdominal ascites. No pelvic adenopathy. Normal urinary bladder and uterus. No adnexal mass. No significant free fluid. Degenerative changes of the left hip.  IMPRESSION:  1. No acute process or evidence of  metastatic disease in the  abdomen or pelvis.  2. Subtle left adrenal nodularity. This warrants followup  attention.  Original Report Authenticated By: Consuello Bossier, M.D.  Assessment:  Barbara Parks is a 61 year old [name] with newly diagnosed metastatic ER positive breast carcinoma with involvement of the right pleura and pericardium. Pleurx catheter is still in place, followed by Dr. Laneta Simmers. She is participating in the [place] study, for which she had been taking letrozole 2.5 mg per day and Afinitor 10 mg per day with evidence of a 20% decrease in tumor burden per recent restaging studies.  Due to initiate day 1 cycle 4 today.  2. History of prior urinary tract infection, resolved,     2.a Grade 1 hematuria.  3. Grade 1 nonblanching/nonpruritic rash of the lower extremities, resolved.  4. History of grade 2 headache, though currently not symptomatic.  5. History of grade 1 diarrhea which has resolved after use of Imodium.  6. Grade 1 elevation of SGOT/SGPT  7. Grade 1 elevation of trigylerides, pending results today, will continue Lipitor 10mg  po daily.  Case reviewed with Dr. Pierce Crane.   Plan:  Shylah will continue Afinitor at 10mg  po daily, letrozole 2.5mg  po daily, and Lipitor 10mg  po daily.  Dore will return on 01/29/2012 for followup, with appropriate protocol base labs prior, and restaging studies to assess radiographic response. This plan was reviewed with the patient, who voices understanding and agreement.  She knows to call with any changes or problems.    Teron Blais T, PA-C 01/02/2012

## 2012-01-03 ENCOUNTER — Other Ambulatory Visit: Payer: Self-pay | Admitting: Lab

## 2012-01-03 ENCOUNTER — Other Ambulatory Visit: Payer: Self-pay | Admitting: Oncology

## 2012-01-03 ENCOUNTER — Ambulatory Visit: Payer: Self-pay | Admitting: Physician Assistant

## 2012-01-03 MED ORDER — GEMFIBROZIL 600 MG PO TABS: 600.0000 mg | ORAL_TABLET | Freq: Two times a day (BID) | ORAL | Status: DC

## 2012-01-03 NOTE — Progress Notes (Signed)
Addended by: Pierce Crane on: 01/03/2012 12:22 PM   Modules accepted: Orders

## 2012-01-28 ENCOUNTER — Ambulatory Visit (HOSPITAL_COMMUNITY)
Admission: RE | Admit: 2012-01-28 | Discharge: 2012-01-28 | Disposition: A | Discharge status: Home / Self Care | Payer: Self-pay | Source: Ambulatory Visit | Attending: Oncology | Admitting: Oncology

## 2012-01-28 DIAGNOSIS — R918 Other nonspecific abnormal finding of lung field: Secondary | ICD-10-CM | POA: Insufficient documentation

## 2012-01-28 DIAGNOSIS — I517 Cardiomegaly: Secondary | ICD-10-CM | POA: Insufficient documentation

## 2012-01-28 DIAGNOSIS — C50919 Malignant neoplasm of unspecified site of unspecified female breast: Secondary | ICD-10-CM | POA: Insufficient documentation

## 2012-01-28 DIAGNOSIS — J90 Pleural effusion, not elsewhere classified: Secondary | ICD-10-CM | POA: Insufficient documentation

## 2012-01-28 DIAGNOSIS — K7689 Other specified diseases of liver: Secondary | ICD-10-CM | POA: Insufficient documentation

## 2012-01-28 MED ORDER — IOHEXOL 300 MG/ML  SOLN
100.0000 mL | Freq: Once | INTRAMUSCULAR | Status: AC | PRN
  Administered 2012-01-28: 100 mL via INTRAVENOUS

## 2012-01-29 ENCOUNTER — Other Ambulatory Visit: Payer: Self-pay | Admitting: *Deleted

## 2012-01-29 ENCOUNTER — Ambulatory Visit (HOSPITAL_BASED_OUTPATIENT_CLINIC_OR_DEPARTMENT_OTHER): Payer: Self-pay | Admitting: Physician Assistant

## 2012-01-29 ENCOUNTER — Encounter: Payer: Self-pay | Admitting: *Deleted

## 2012-01-29 ENCOUNTER — Telehealth: Payer: Self-pay | Admitting: *Deleted

## 2012-01-29 ENCOUNTER — Other Ambulatory Visit (HOSPITAL_BASED_OUTPATIENT_CLINIC_OR_DEPARTMENT_OTHER): Payer: Self-pay | Admitting: Lab

## 2012-01-29 VITALS — BP 121/80 | HR 87 | Temp 97.9°F | Ht 65.0 in | Wt 191.7 lb

## 2012-01-29 DIAGNOSIS — C50919 Malignant neoplasm of unspecified site of unspecified female breast: Secondary | ICD-10-CM

## 2012-01-29 DIAGNOSIS — C782 Secondary malignant neoplasm of pleura: Secondary | ICD-10-CM

## 2012-01-29 DIAGNOSIS — Z170 Estrogen receptor positive status [ER+]: Secondary | ICD-10-CM

## 2012-01-29 DIAGNOSIS — C779 Secondary and unspecified malignant neoplasm of lymph node, unspecified: Secondary | ICD-10-CM

## 2012-01-29 LAB — LIPID PANEL
HDL: 55 mg/dL (ref >39)
LDL Cholesterol: 124 mg/dL — ABNORMAL HIGH (ref 0–99)
Triglycerides: 181 mg/dL — ABNORMAL HIGH (ref <150)

## 2012-01-29 LAB — CBC WITH DIFFERENTIAL/PLATELET
BASO%: 0.8 % (ref 0.0–2.0)
Basophils Absolute: 0.1 10*3/uL (ref 0.0–0.1)
Eosinophils Absolute: 0.2 10*3/uL (ref 0.0–0.5)
HCT: 35.9 % (ref 34.8–46.6)
HGB: 12 g/dL (ref 11.6–15.9)
LYMPH%: 18.2 % (ref 14.0–49.7)
MCHC: 33.3 g/dL (ref 31.5–36.0)
MONO#: 0.7 10*3/uL (ref 0.1–0.9)
NEUT%: 65.9 % (ref 38.4–76.8)
Platelets: 303 10*3/uL (ref 145–400)
WBC: 5.9 10*3/uL (ref 3.9–10.3)
lymph#: 1.1 10*3/uL (ref 0.9–3.3)

## 2012-01-29 LAB — COMPREHENSIVE METABOLIC PANEL
ALT: 47 U/L — ABNORMAL HIGH (ref 0–35)
BUN: 13 mg/dL (ref 6–23)
CO2: 19 mEq/L (ref 19–32)
Calcium: 10.2 mg/dL (ref 8.4–10.5)
Chloride: 107 mEq/L (ref 96–112)
Creatinine, Ser: 0.76 mg/dL (ref 0.50–1.10)
Glucose, Bld: 109 mg/dL — ABNORMAL HIGH (ref 70–99)
Total Bilirubin: 0.3 mg/dL (ref 0.3–1.2)

## 2012-01-29 LAB — CK: Total CK: 85 U/L (ref 7–177)

## 2012-01-29 LAB — BILIRUBIN, DIRECT: Bilirubin, Direct: 0.1 mg/dL (ref 0.0–0.3)

## 2012-01-29 MED ORDER — INV-EVEROLIMUS (RAD0001) 5MG TABLET NOVARTIS CRAD001Y24135: 2.0000 | ORAL_TABLET | Freq: Every day | ORAL | Status: DC

## 2012-01-29 NOTE — Progress Notes (Deleted)
Hematology and Oncology Follow Up Visit  Barbara Parks [id number] [birth year] 61 y.o. 01/29/2012    HPI: Barbara Parks is a 61 year old [name] with newly diagnosed metastatic ER positive breast carcinoma with involvement of the right pleura and pericardium. Pleurx catheter is still in place, followed by Dr. Laneta Simmers. She is participating in the [place] study, for which she had been taking letrozole 2.5 mg per day and Afinitor 10 mg per day.  Due to initiate day 1 cycle 5 today.  2. History of prior urinary tract infection, resolved,     3. Grade 1 nonblanching/nonpruritic rash of the lower extremities.  4. Grade 2 headache, though currently not symptomatic.  5. History of grade 2 diarrhea which has resolved after use of Imodium.  6. Grade 2 elevation of ALT.  7. Grade 2 elevation of trigylerides.  Interim History:   Barbara Parks is seen today with her sister in accompaniment for followup for tolerance assessment of letrozole 2.5 mg per day given in combination with Afinitor 10 mg per day per the BOLERO-4  study. She is due to initiate day 1 cycle 5 today.   She continues to experience some very mild low-grade nausea intermittently without emesis.  She has no headaches recently.  She does experience intermittent diarrhea that Imodium resolves. She denies any bleeding or bruising symptoms.  She denies any neuropathy symptoms. She has had no recurrent shortness of breath, cough, or chest pain. Her pedal edema has resolved.  A detailed review of systems is otherwise noncontributory as noted below.  Review of Systems: Constitutional: No fevers or chills, fatigue has improved significantly, she does have hot flashes fairly consistently but are not affecting her ADLs. Eyes: uses glasses ENT: 1 episode of mucositis which has since abated. Cardiovascular: As above. Respiratory: Cough has pretty much abated except when her Pleurx catheter needs to be drained, occurring every 3-4  days. Neurological: no TIA or stroke symptoms Dermatological: negative Gastrointestinal:As above. Genito-Urinary: No complaints. Hematological and Lymphatic: negative Breast: negative Musculoskeletal: negative Remaining ROS negative.    Medications:   I have reviewed the patient's current medications.  Current Outpatient Prescriptions  Medication Sig Dispense Refill  . atorvastatin (LIPITOR) 10 MG tablet Take 10 mg by mouth daily.      Marland Kitchen gemfibrozil (LOPID) 600 MG tablet Take 600 mg by mouth 2 (two) times daily before a meal.      . gemfibrozil (LOPID) 600 MG tablet Take 1 tablet (600 mg total) by mouth 2 (two) times daily before a meal.  60 tablet  4  . Hydrocortisone (CORTIZONE-10 EX) Apply 1 application topically 2 (two) times daily as needed. For eczema on hands      . ibuprofen (ADVIL,MOTRIN) 200 MG tablet Take 400 mg by mouth every 6 (six) hours as needed. For pain      . Investigational everolimus (RAD001) 5 MG tablet Novartis [id number] Take 2 tablets by mouth daily. Take with a glass of water.      . Investigational everolimus (RAD001) 5 MG tablet [employer]is [id number] Take 2 tablets by mouth daily. Take with a glass of water.  70 tablet  0  . letrozole (FEMARA) 2.5 MG tablet Take 2.5 mg by mouth daily.      Marland Kitchen letrozole (FEMARA) 2.5 MG tablet Take 1 tablet (2.5 mg total) by mouth daily.  30 tablet  0  . metoprolol tartrate (LOPRESSOR) 25 MG tablet Take 12.5 mg by mouth daily.       Marland Kitchen  omeprazole (PRILOSEC) 20 MG capsule Take 20 mg by mouth daily as needed. For reflux      . prochlorperazine (COMPAZINE) 10 MG tablet Take 10 mg by mouth every 6 (six) hours as needed. For nausea        Allergies:  Allergies  Allergen Reactions  . Aspirin     REACTION: upset stomach  . Latex Other (See Comments)    Blistering and skin peels off  . Nsaids Nausea And Vomiting    Extreme nausea and vomiting    Physical Exam: Filed Vitals:   01/29/12 1135  BP: 121/80  Pulse:   Temp:      Body mass index is 31.90 kg/(m^2). Weight: 191 lbs. HEENT:  Sclerae anicteric, conjunctivae pink.  Oropharynx clear.  No mucositis or candidiasis.  No evidence of thyromegaly. Nodes:  No cervical, supraclavicular, or axillary lymphadenopathy palpated. Breast Exam: Deferred.  Lungs:  Clear to auscultation bilaterally.  No crackles, rhonchi, or wheezes.   Heart:  Regular rate and rhythm, no murmurs rubs gallops or clicks appreciated. ABD: Soft, nontender without organomegaly. Normal to slightly hyperactive bowel sounds appreciated no distention. EXT:  No cyanosis, or ecchymoses. No rash is evident. No evidence of pedal edema. NEURO: Alert and oriented x3 without any focal deficits.  ECOG: 0  Lab Results: Lab Results  Component Value Date   WBC 5.9 01/29/2012   HGB 12.0 01/29/2012   HCT 35.9 01/29/2012   MCV 79.6 01/29/2012   PLT 303 01/29/2012   NEUTROABS 3.9 01/29/2012     Chemistry      Component Value Date/Time   NA 138 01/02/2012 1114   K 3.7 01/02/2012 1114   CL 104 01/02/2012 1114   CO2 23 01/02/2012 1114   BUN 12 01/02/2012 1114   CREATININE 0.74 01/02/2012 1114      Component Value Date/Time   CALCIUM 9.4 01/02/2012 1114   ALKPHOS 182* 01/02/2012 1114   AST 52* 01/02/2012 1114   ALT 77* 01/02/2012 1114   BILITOT 0.3 01/02/2012 1114      Lab Results  Component Value Date   LABCA2 117* 10/23/2011   Radiological data: 01/28/12 CT CHEST, ABDOMEN AND PELVIS WITH CONTRAST  Technique: Contiguous axial images of the chest abdomen and pelvis were obtained after IV contrast administration.  Contrast: 100 ml Omnipaque-300  Comparison: 12/03/2011  CT CHEST  Findings:  RECIST 1.0 target lesions:  - 6 mm right lower lobe lung nodule on image 39 versus 7 mm on the prior.  - 1.1 cm subcarinal lymph node on image 30, which is unchanged.  Non measurable lesion: Similar small right pleural effusion.  Lung windows demonstrate similar bronchial wall thickening to the right lower lobe, mild.  Mild volume  loss right lung base which is similar.  Right lower lobe lung nodule which measures 6 mm on image 39 versus 7 mm on the prior.  Perifissural nodule in superior segment right lower lobe is similar  at 4 mm on image 30. The left lung remains clear.  Soft tissue windows demonstrate no supraclavicular adenopathy.  Right mastectomy and axillary nodal dissection. No axillary  adenopathy.  Mild cardiomegaly. Small right pleural effusion which is similar.  No left pleural effusion. No central pulmonary embolism, on this  non-dedicated study. Subcarinal node which measures 1.1 cm on  image 30 and is unchanged. No hilar adenopathy. No internal  mammary adenopathy. Similar sclerosis and irregularity involving  the posterior 11th left rib on image 49 of series 2.  IMPRESSION:  1. Overall similar appearance of the chest since the prior exam.  2. Small right-sided pulmonary nodules and upper normal-size  subcarinal node are similar.  3. Similar small right pleural effusion.  4. Similar sclerosis and irregularity involving the posterior left  eleventh rib. Favor post-traumatic.  CT ABDOMEN AND PELVIS  Findings: Moderate hepatic steatosis. Suspect an area of mildly  altered perfusion in the right lobe of the liver on image 52.  Likely similar on the prior. Focal steatosis adjacent the falciform  ligament. No suspicious liver lesion. Normal spleen, stomach.  Transverse duodenal diverticulum. Normal pancreas. Minimal soft  tissue density at the gallbladder fundus which measures 7 mm on  image 65. Possibly present on the prior. No evidence of acute  cholecystitis. No biliary ductal dilatation.  Normal right adrenal gland with minimal left adrenal nodularity,  unchanged. Normal kidneys. No retroperitoneal or retrocrural  adenopathy. Normal colon, appendix, and terminal ileum. Normal  small bowel without abdominal ascites.  No evidence of omental or peritoneal disease. No pelvic  adenopathy. Normal  urinary bladder and uterus. No adnexal mass.  No significant free fluid. Left hip osteoarthritis.  Subtle cortical based sclerotic focus is identified within the  right iliac wing. This measures 8 mm on image 88 and is unchanged back to 10/03/2011. Not definitely present on 08/01/2005. Degenerative disc disease at L3-L4  IMPRESSION:  1. No extraosseous metastatic disease within the abdomen or  pelvis.  2. Hepatic steatosis. Probable area of altered perfusion in the  right lobe of the liver. This warrants followup attention.  3. 7 mm soft tissue density in the gallbladder fundus.  Considerations include a polyp or a mobile stone. Focal  adenomyomatosis could look similar.  4. Subtle 8 mm focus of sclerosis within the right iliac wing.  Cannot exclude osseous metastasis. Recommend attention on follow- up.  Original Report Authenticated By: Consuello Bossier, M.D.  Assessment:  Barbara Parks is a 61 year old [name] with newly diagnosed metastatic ER positive breast carcinoma with involvement of the right pleura and pericardium. Pleurx catheter is still in place, followed by Dr. Laneta Simmers. She is participating in the [place] study, for which she had been taking letrozole 2.5 mg per day and Afinitor 10 mg per day with evidence of a 23% decrease in tumor burden per recent restaging studies.  Due to initiate day 1 cycle 5 today.  2. History of prior urinary tract infection, resolved,     2.a Grade 1 hematuria.  3. Grade 1 nonblanching/nonpruritic rash of the lower extremities, resolved.  4. History of grade 2 headache, though currently not symptomatic.  5. History of grade 1 diarrhea which has resolved after use of Imodium.  6. Grade 1 elevation of SGOT/SGPT  7. Grade 2 elevation of trigylerides, pending results today, will continue Lopid daily.  Case reviewed with Dr. Pierce Crane.   Plan:  Barbara Parks will continue Afinitor at 10mg  po daily, letrozole 2.5mg  po daily, and Lipitor  10mg  po daily.  Barbara Parks will return on 02/27/2012 for followup, with appropriate protocol base labs prior. This plan was reviewed with the patient, who voices understanding and agreement.  She knows to call with any changes or problems.   Dasiah Hooley T, PA-C 01/29/2012

## 2012-01-29 NOTE — Progress Notes (Signed)
01/29/12 at 1:27pm- [facility] 4- cycle 5, day 1 study notes- The pt was into the cancer center this morning for her cycle 5, day 1 assessments.  The pt returned her cycle 4 everolimus drug kit for the drug accountability check.  The pt confirmed that she took the study drugs (everolimus and letrozole) from 01/03/12 through 01/28/12 (26 days in total).  The pt came in a day earlier due to the July 4th holiday.  The pt returned 18 unopened everolimus pills.  Therefore, the pt has been 100% compliant with the study drug, everolimus (70 (26x2)=18).  The drug accountability was also confirmed by [employer] , pharmacist.  She had her fasting labs drawn.  The pt's last set of biomarkers were drawn for research purposes.  The pt was seen and examined today by Debbora Presto, PA.  The PA and Dr. Donnie Coffin reviewed the labs (cbc/diff and stat cmet).  The pt only had grade 1 toxicities (glucose, alkaline phosphatase, and AST/ALT).  No dose modifications were required.  The pt's other labs are pending.  The pt states she has been feeling very good lately.  Her sister has been visiting  with her for the past 2 weeks.  She denies any fatigue, and she states she has been very busy with fishing and "mining for gems".  ECOG=0.  She denies any breathing problems, cough, and dyspnea.  She also denies any fevers and any pain.  She does report intermittent mild nausea which does not affect her eating.  She also reports some mild diarrhea (grade 1) which she proactively takes Immodium as needed.  Her lower extremity rash has not re-occurred. She states she is taking her Lopid as prescribed.  She also reports eating less "fatty foods" this month to help lower her triglycerides.  She also states she has been exercising more because she is no longer feeling tired.  The pt has had a noted 5 lb weight loss over the past month.  She denies report ongoing hot flashes and some mild memory impairment.   The only new adverse event reported by the pt is some  mild arthritic pain in her hands. The PA felt that this new "arthritis-like" pain in her hands may be related to her study drugs.  The pt was dispensed her cycle 5 everolimus drug kit for self administration.  The pt confirmed that she has enough letrozole on hand for the next 2 months.  The pt was told that she is to continue taking her study drugs as prescribed for this cycle.  The pt was informed that the research staff will contact her by cell phone if there needs to be any dose modifications made once all of her labs are resulted.  The pt is leaving after this office visit with her sister to travel to [place] for her brother's 50th wedding anniversary (7 hour drive).  The research nurse reviewed her current medications with the pt.  She denies any new medications or changes to her medications.  The pt was given her future appointments for 02/27/12.  The pt had no questions/concerns for the research nurse.       01/29/12 at 4:40pm - Rn received the pt's lipid panel and the rest of her labs this afternoon.  The pt's triglycerides are down to 181 (previously were 497).  Her cholesterol is stable at 215.  Dr. Donnie Coffin reviewed her labs and was pleased with her results.  The research nurse called the pt on her cell phone to  inform her that her Lopid was working and that her triglycerides were below 200.   The pt was thankful to hear that her medication was working.  The pt was advised to continue taking her study drugs as prescribed.  The pt verbalized understanding.

## 2012-01-29 NOTE — Telephone Encounter (Signed)
Gave patient appointment for 02-27-2012 starting at 11:30am with labs seeing the md at 1:30pm confirmed with the research nurse that the date and times would be ok

## 2012-02-03 ENCOUNTER — Other Ambulatory Visit (HOSPITAL_COMMUNITY): Payer: Self-pay

## 2012-02-04 ENCOUNTER — Other Ambulatory Visit: Payer: Self-pay | Admitting: Lab

## 2012-02-04 ENCOUNTER — Ambulatory Visit: Payer: Self-pay | Admitting: Physician Assistant

## 2012-02-04 NOTE — Progress Notes (Signed)
Hematology and Oncology Follow Up Visit  Barbara Parks [id number] [birth year] 61 y.o. 01/29/12    HPI: Barbara Parks is a 61 year old [name] with newly diagnosed metastatic ER positive breast carcinoma with involvement of the right pleura and pericardium. She is participating in the [place] study, for which she had been taking letrozole 2.5 mg per day and Afinitor 10 mg per day.  Due to initiate day 1 cycle 5 today.  2. History of prior urinary tract infection, resolved,     3. Grade 1 nonblanching/nonpruritic rash of the lower extremities.  4. Grade 2 headache, though currently not symptomatic.  5. History of grade 2 diarrhea which has resolved after use of Imodium.  6. Grade 2 elevation of ALT.  7. Grade 2 elevation of trigylerides.  Interim History:   Barbara Parks is seen today with her sister in accompaniment for followup for tolerance assessment of letrozole 2.5 mg per day given in combination with Afinitor 10 mg per day per the BOLERO-4  study. She is due to initiate day 1 cycle 5 today.   She continues to experience some very mild low-grade nausea intermittently without emesis.  She has no headaches recently.  She does experience intermittent diarrhea that Imodium resolves. She denies any bleeding or bruising symptoms.  She denies any neuropathy symptoms. She has had no recurrent shortness of breath, cough, or chest pain. Her pedal edema has resolved.  A detailed review of systems is otherwise noncontributory as noted below.  Review of Systems: Constitutional: No fevers or chills, fatigue has improved significantly, she does have hot flashes fairly consistently but are not affecting her ADLs. Eyes: uses glasses ENT: 1 episode of mucositis which has since abated. Cardiovascular: As above. Respiratory: Cough has abated. Neurological: no TIA or stroke symptoms Dermatological: negative Gastrointestinal:As above. Genito-Urinary: No complaints. Hematological and  Lymphatic: negative Breast: negative Musculoskeletal: negative Remaining ROS negative.    Medications:   I have reviewed the patient's current medications.  Current Outpatient Prescriptions  Medication Sig Dispense Refill  . atorvastatin (LIPITOR) 10 MG tablet Take 10 mg by mouth daily.      Marland Kitchen gemfibrozil (LOPID) 600 MG tablet Take 600 mg by mouth 2 (two) times daily before a meal.      . gemfibrozil (LOPID) 600 MG tablet Take 1 tablet (600 mg total) by mouth 2 (two) times daily before a meal.  60 tablet  4  . Hydrocortisone (CORTIZONE-10 EX) Apply 1 application topically 2 (two) times daily as needed. For eczema on hands      . ibuprofen (ADVIL,MOTRIN) 200 MG tablet Take 400 mg by mouth every 6 (six) hours as needed. For pain      . Investigational everolimus (RAD001) 5 MG tablet Novartis [id number] Take 2 tablets by mouth daily. Take with a glass of water.      . Investigational everolimus (RAD001) 5 MG tablet [employer]is [id number] Take 2 tablets by mouth daily. Take with a glass of water.  70 tablet  0  . Investigational everolimus (RAD001) 5 MG tablet Novartis [id number] Take 2 tablets by mouth daily. Take with a glass of water.  70 tablet  0  . letrozole (FEMARA) 2.5 MG tablet Take 2.5 mg by mouth daily.      Marland Kitchen letrozole (FEMARA) 2.5 MG tablet Take 1 tablet (2.5 mg total) by mouth daily.  30 tablet  0  . metoprolol tartrate (LOPRESSOR) 25 MG tablet Take 12.5 mg by mouth daily.       Marland Kitchen  omeprazole (PRILOSEC) 20 MG capsule Take 20 mg by mouth daily as needed. For reflux      . prochlorperazine (COMPAZINE) 10 MG tablet Take 10 mg by mouth every 6 (six) hours as needed. For nausea        Allergies:  Allergies  Allergen Reactions  . Aspirin     REACTION: upset stomach  . Latex Other (See Comments)    Blistering and skin peels off  . Nsaids Nausea And Vomiting    Extreme nausea and vomiting    Physical Exam: Filed Vitals:   01/29/12 1135  BP: 121/80  Pulse:   Temp:      Body mass index is 31.90 kg/(m^2). Weight: 191 lbs. HEENT:  Sclerae anicteric, conjunctivae pink.  Oropharynx clear.  No mucositis or candidiasis.  No evidence of thyromegaly. Nodes:  No cervical, supraclavicular, or axillary lymphadenopathy palpated. Breast Exam: Deferred.  Lungs:  Clear to auscultation bilaterally.  No crackles, rhonchi, or wheezes.   Heart:  Regular rate and rhythm, no murmurs rubs gallops or clicks appreciated. ABD: Soft, nontender without organomegaly. Normal to slightly hyperactive bowel sounds appreciated no distention. EXT:  No cyanosis, or ecchymoses. No rash is evident. No evidence of pedal edema. NEURO: Alert and oriented x3 without any focal deficits.  ECOG: 0  Lab Results: Lab Results  Component Value Date   WBC 5.9 01/29/2012   HGB 12.0 01/29/2012   HCT 35.9 01/29/2012   MCV 79.6 01/29/2012   PLT 303 01/29/2012   NEUTROABS 3.9 01/29/2012     Chemistry      Component Value Date/Time   NA 138 01/29/2012 1110   K 3.7 01/29/2012 1110   CL 107 01/29/2012 1110   CO2 19 01/29/2012 1110   BUN 13 01/29/2012 1110   CREATININE 0.76 01/29/2012 1110      Component Value Date/Time   CALCIUM 10.2 01/29/2012 1110   ALKPHOS 164* 01/29/2012 1110   AST 47* 01/29/2012 1110   ALT 47* 01/29/2012 1110   BILITOT 0.3 01/29/2012 1110      Lab Results  Component Value Date   LABCA2 117* 10/23/2011   Radiological data: 01/28/12 CT CHEST, ABDOMEN AND PELVIS WITH CONTRAST  Technique: Contiguous axial images of the chest abdomen and pelvis were obtained after IV contrast administration.  Contrast: 100 ml Omnipaque-300  Comparison: 12/03/2011  CT CHEST  Findings:  RECIST 1.0 target lesions:  - 6 mm right lower lobe lung nodule on image 39 versus 7 mm on the prior.  - 1.1 cm subcarinal lymph node on image 30, which is unchanged.  Non measurable lesion: Similar small right pleural effusion.  Lung windows demonstrate similar bronchial wall thickening to the right lower lobe, mild.  Mild volume  loss right lung base which is similar.  Right lower lobe lung nodule which measures 6 mm on image 39 versus 7 mm on the prior.  Perifissural nodule in superior segment right lower lobe is similar  at 4 mm on image 30. The left lung remains clear.  Soft tissue windows demonstrate no supraclavicular adenopathy.  Right mastectomy and axillary nodal dissection. No axillary  adenopathy.  Mild cardiomegaly. Small right pleural effusion which is similar.  No left pleural effusion. No central pulmonary embolism, on this  non-dedicated study. Subcarinal node which measures 1.1 cm on  image 30 and is unchanged. No hilar adenopathy. No internal  mammary adenopathy. Similar sclerosis and irregularity involving  the posterior 11th left rib on image 49 of series 2.  IMPRESSION:  1. Overall similar appearance of the chest since the prior exam.  2. Small right-sided pulmonary nodules and upper normal-size  subcarinal node are similar.  3. Similar small right pleural effusion.  4. Similar sclerosis and irregularity involving the posterior left  eleventh rib. Favor post-traumatic.  CT ABDOMEN AND PELVIS  Findings: Moderate hepatic steatosis. Suspect an area of mildly  altered perfusion in the right lobe of the liver on image 52.  Likely similar on the prior. Focal steatosis adjacent the falciform  ligament. No suspicious liver lesion. Normal spleen, stomach.  Transverse duodenal diverticulum. Normal pancreas. Minimal soft  tissue density at the gallbladder fundus which measures 7 mm on  image 65. Possibly present on the prior. No evidence of acute  cholecystitis. No biliary ductal dilatation.  Normal right adrenal gland with minimal left adrenal nodularity,  unchanged. Normal kidneys. No retroperitoneal or retrocrural  adenopathy. Normal colon, appendix, and terminal ileum. Normal  small bowel without abdominal ascites.  No evidence of omental or peritoneal disease. No pelvic  adenopathy. Normal  urinary bladder and uterus. No adnexal mass.  No significant free fluid. Left hip osteoarthritis.  Subtle cortical based sclerotic focus is identified within the  right iliac wing. This measures 8 mm on image 88 and is unchanged back to 10/03/2011. Not definitely present on 08/01/2005. Degenerative disc disease at L3-L4  IMPRESSION:  1. No extraosseous metastatic disease within the abdomen or  pelvis.  2. Hepatic steatosis. Probable area of altered perfusion in the  right lobe of the liver. This warrants followup attention.  3. 7 mm soft tissue density in the gallbladder fundus.  Considerations include a polyp or a mobile stone. Focal  adenomyomatosis could look similar.  4. Subtle 8 mm focus of sclerosis within the right iliac wing.  Cannot exclude osseous metastasis. Recommend attention on follow- up.  Original Report Authenticated By: Consuello Bossier, M.D.  Assessment:  Barbara Parks is a 61 year old [name] with newly diagnosed metastatic ER positive breast carcinoma with involvement of the right pleura and pericardium. She is participating in the [place] study, for which she had been taking letrozole 2.5 mg per day and Afinitor 10 mg per day with evidence of a 23% decrease in tumor burden per recent restaging studies.  Due to initiate day 1 cycle 5 today.  2. History of prior urinary tract infection, resolved,     2.a Grade 1 hematuria.  3. Grade 1 nonblanching/nonpruritic rash of the lower extremities, resolved.  4. History of grade 2 headache, though currently not symptomatic.  5. History of grade 1 diarrhea which has resolved after use of Imodium.  6. Grade 1 elevation of SGOT/SGPT  7. Grade 2 elevation of trigylerides, pending results today, will continue Lopid daily.  Case reviewed with Dr. Pierce Crane.   Plan:  Flornce will continue Afinitor at 10mg  po daily, letrozole 2.5mg  po daily, and Lopid daily.  Atianna will return on 02/27/2012 for followup, with  appropriate protocol base labs prior. This plan was reviewed with the patient, who voices understanding and agreement.  She knows to call with any changes or problems.   Amada Kingfisher, PA-C 01/29/12

## 2012-02-11 ENCOUNTER — Ambulatory Visit: Payer: Self-pay | Admitting: Physician Assistant

## 2012-02-11 ENCOUNTER — Other Ambulatory Visit: Payer: Self-pay | Admitting: Lab

## 2012-02-20 IMAGING — CR DG CHEST 2V
2 series · 2 of 2 positions shown · non-contrast
Comparison: 05/10/2004

CLINICAL DATA: Right chest pain.  History of breast cancer

CHEST - 2 VIEW

[view not recorded (1 of 2)]
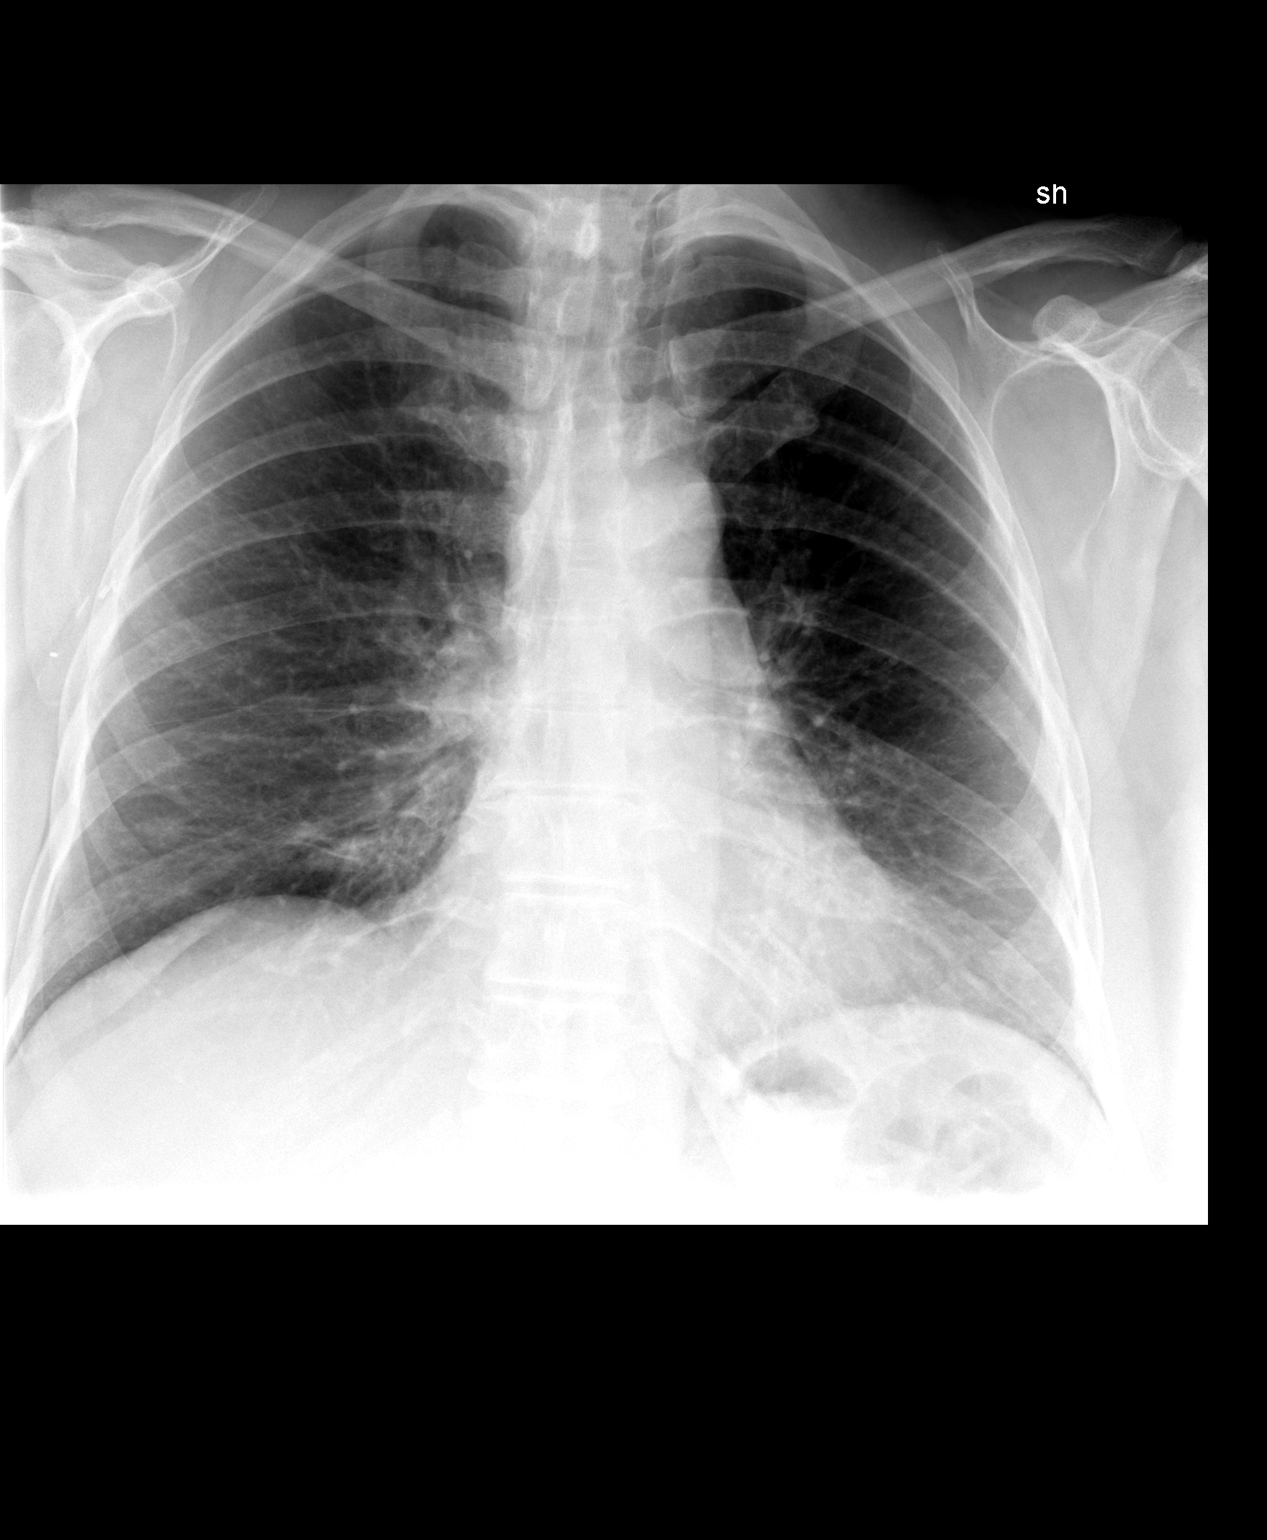

[view not recorded (2 of 2)]
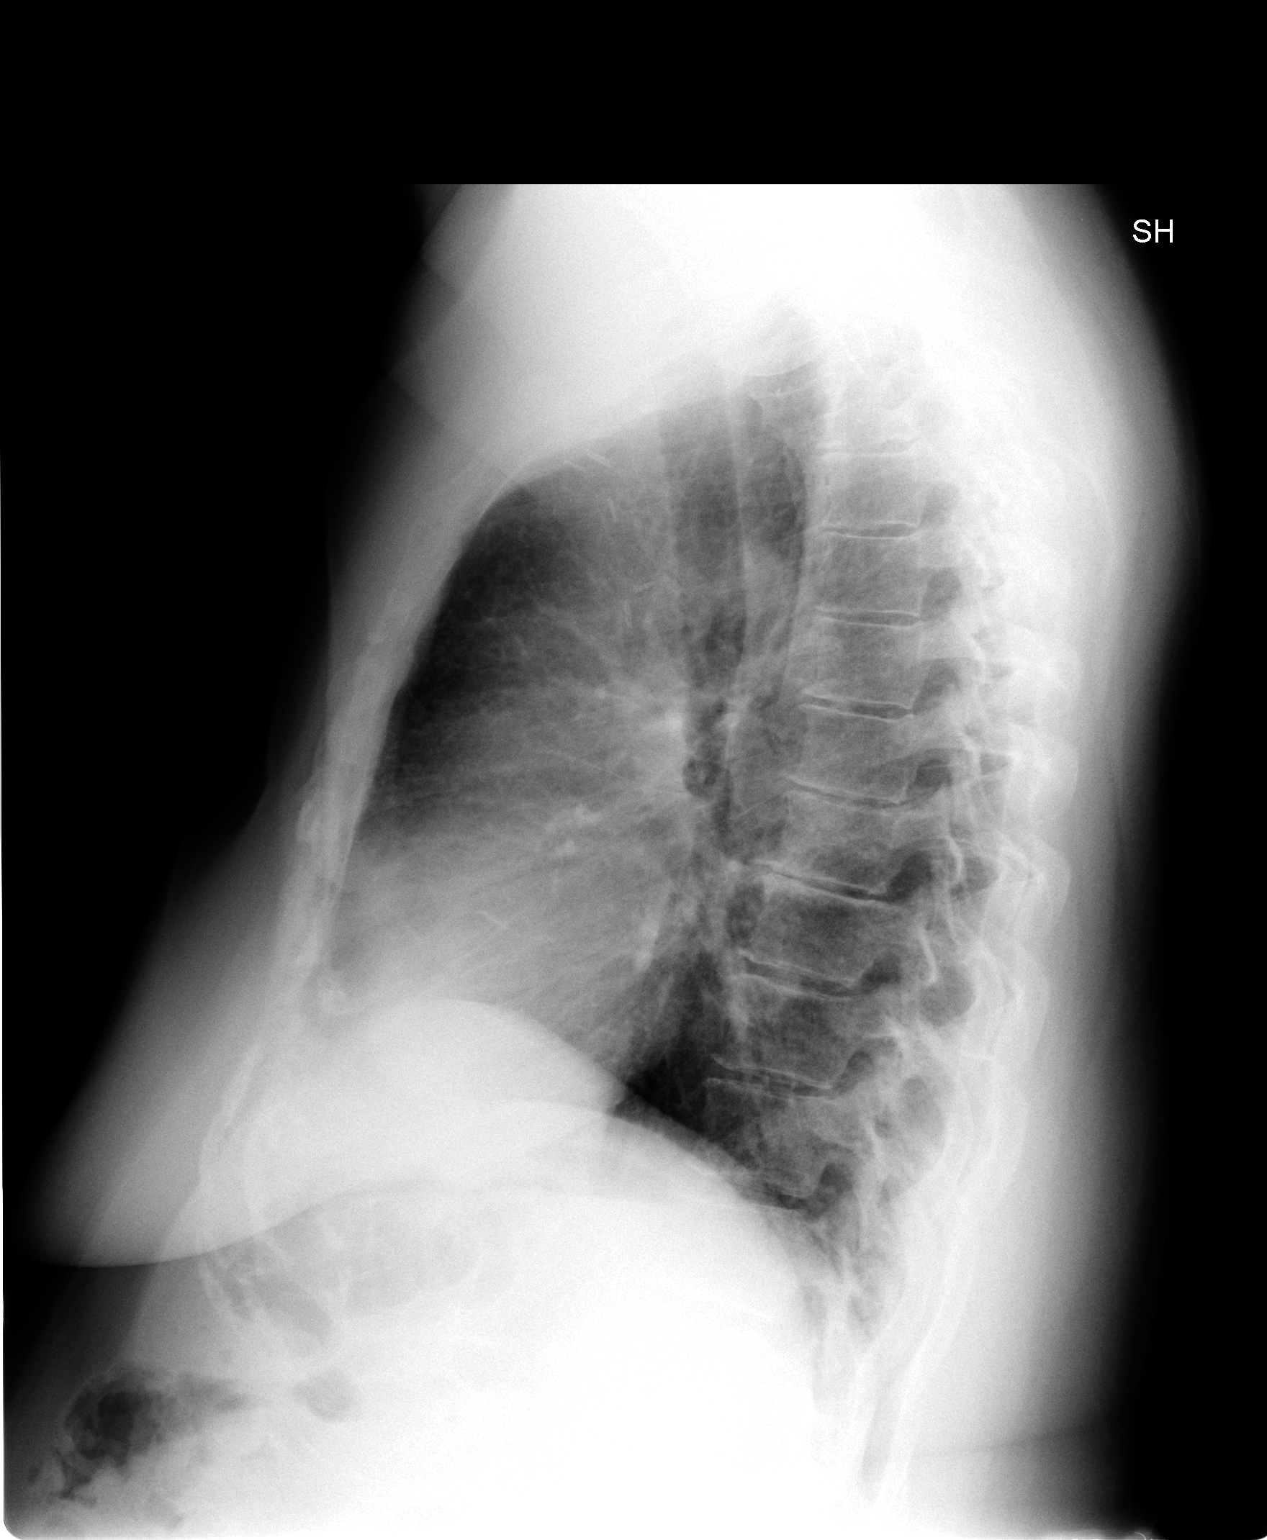

[2 of 2 positions shown; findings below may reference images not displayed]

FINDINGS: Heart and mediastinal contours normal.  Lungs clear
except for an ovoid nodule that projects over the right lung base,
measuring about 13.6 mm in long axis.  I cannot say this was
present on prior studies.  This could possibly be the right nipple
shadow, although it appears as if the patient has had a right
mastectomy with axillary node dissection.  Recommend repeat PA
chest x-ray with the right nipple marked, if there is still a right
nipple.  If not, recommend CT of the chest with contrast.  No
osseous lesions or pleural fluid.
IMPRESSION: 1.  Possible right lower lobe nodule - this could be a nipple
shadow, if there is still a right nipple clinically.  See above
discussion.
2.  Otherwise unremarkable.
3.  Prior right mastectomy.

## 2012-02-21 IMAGING — CT CT CHEST W/ CM
2 of 3 series · 15 of 36 positions shown, 18 images · IV contrast (omnipaque)
Comparison: Chest x-ray 08/08/2010chest CT 04/16/2004

CLINICAL DATA: Lung nodules seen on chest x-ray.Breast cancer.
Prior mastectomy and chemotherapy.

CT CHEST WITH CONTRAST
TECHNIQUE: Multidetector CT imaging of the chest was performed
following the standard protocol during bolus administration of
intravenous contrast.
Contrast: 75 ml Omnipaque 300 IV.

[Series 2: routine chest · axial · 0.70mm/px · z∈[-284,-24]mm · 12 of 62 slices shown, 15 images]
[im 5/62  mediastinal]
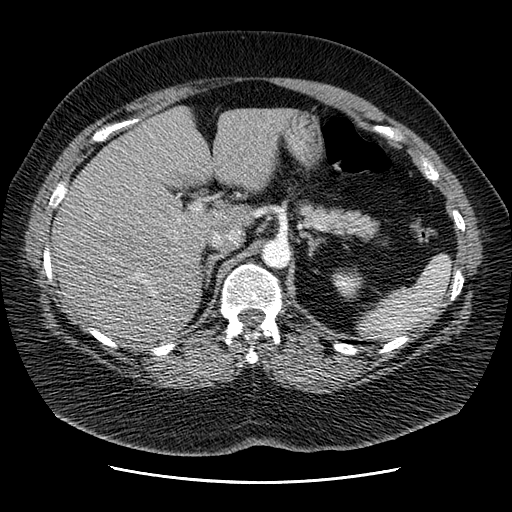
[im 5/62  lung]
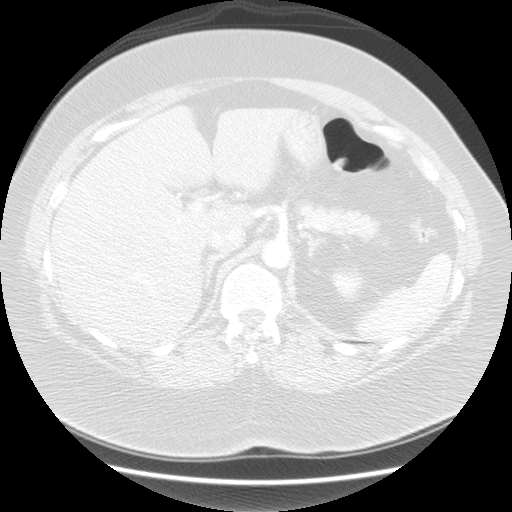
[im 10/62  lung]
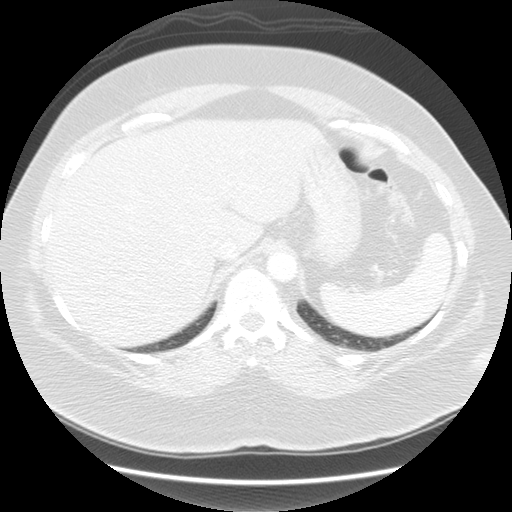
[im 14/62  lung]
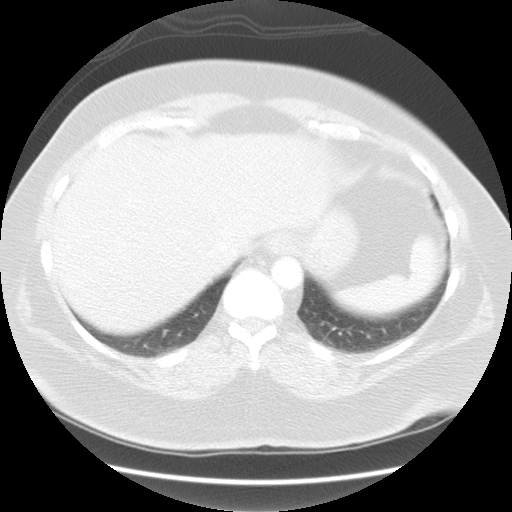
[im 19/62  lung]
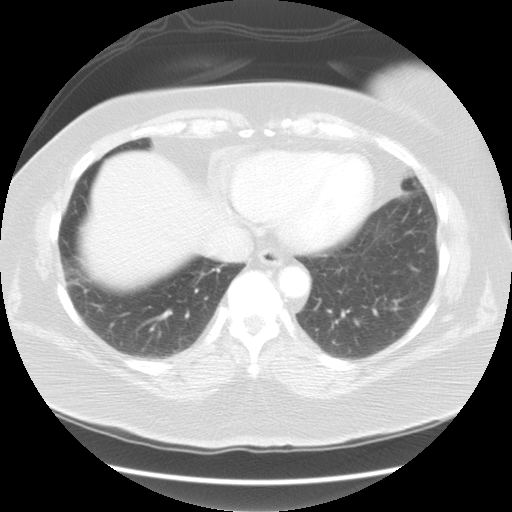
[im 23/62  mediastinal]
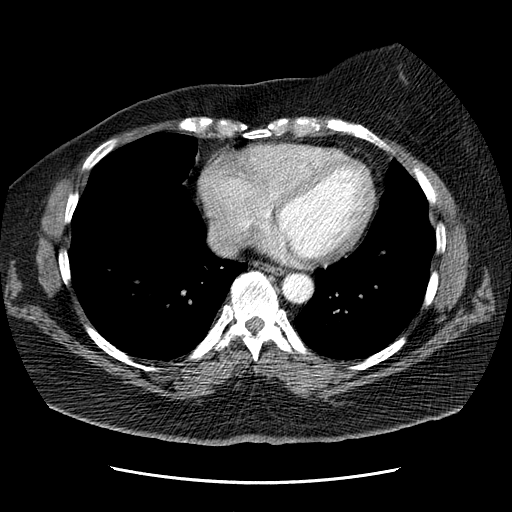
[im 23/62  lung]
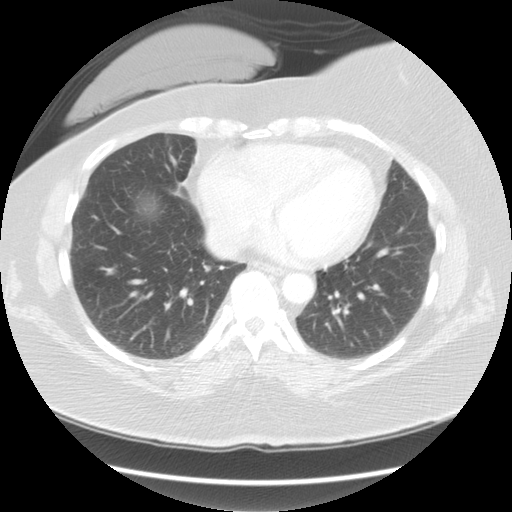
[im 28/62  lung]
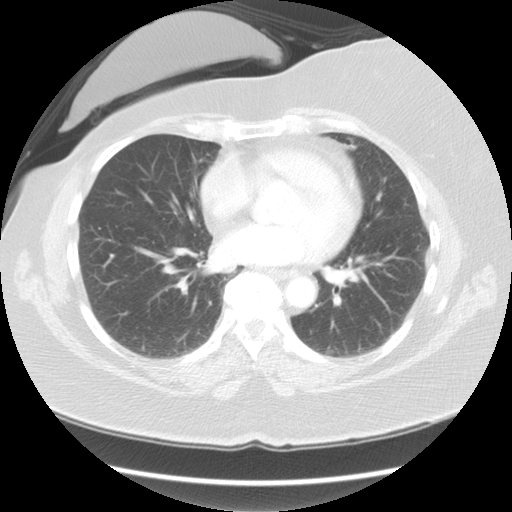
[im 34/62  lung]
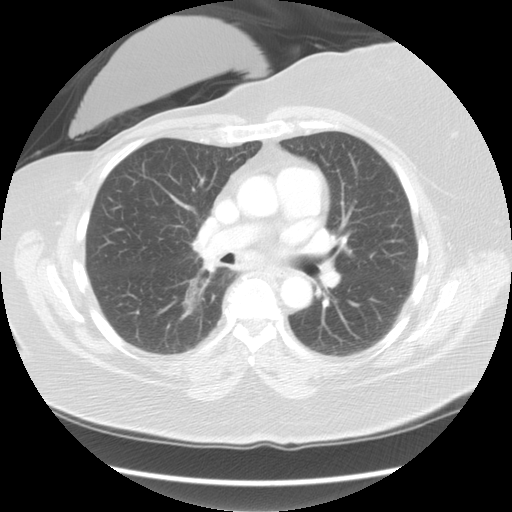
[im 39/62  lung]
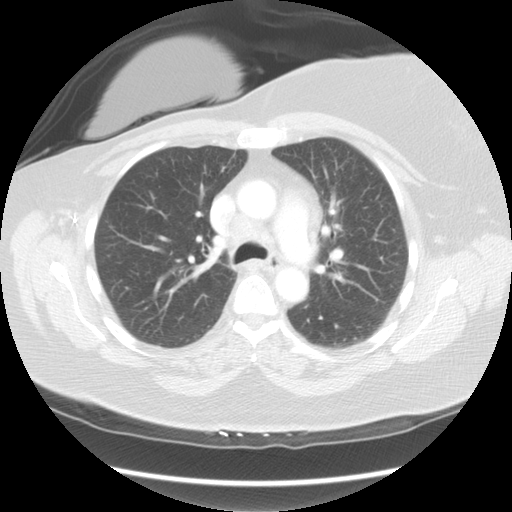
[im 43/62  mediastinal]
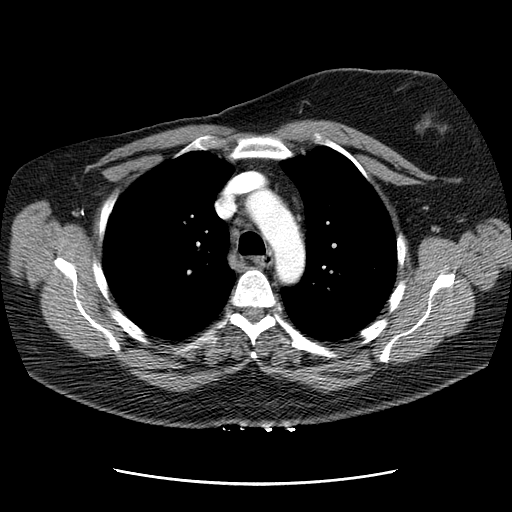
[im 43/62  lung]
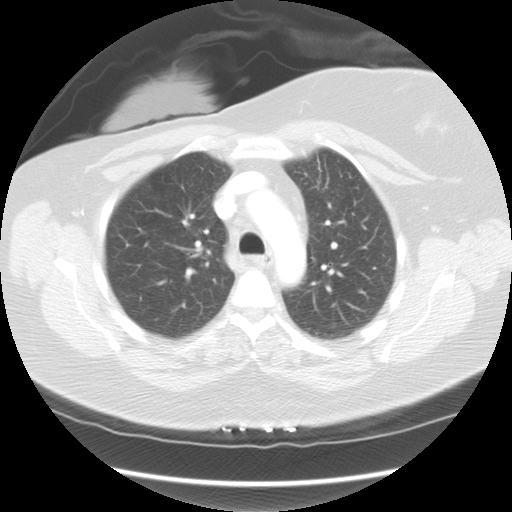
[im 48/62  lung]
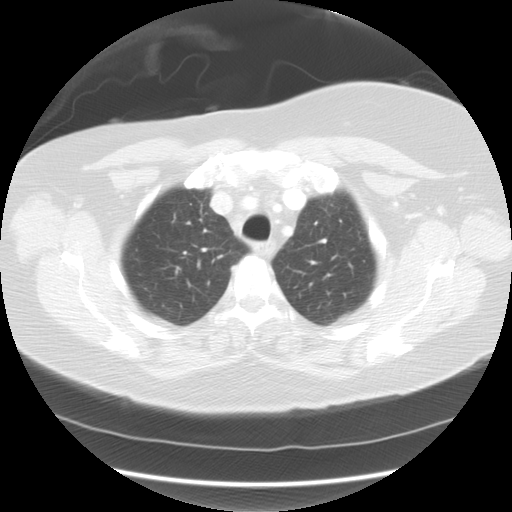
[im 52/62  lung]
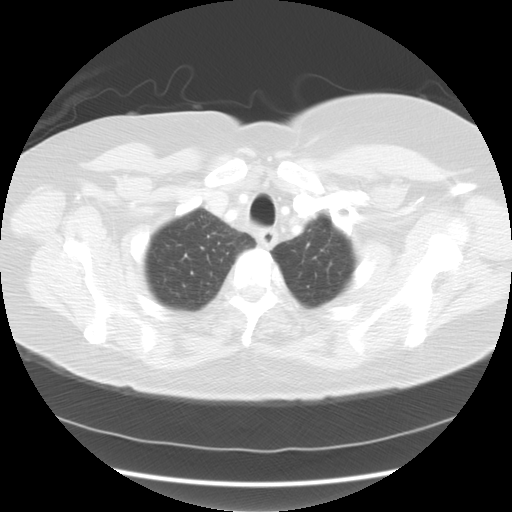
[im 57/62  lung]
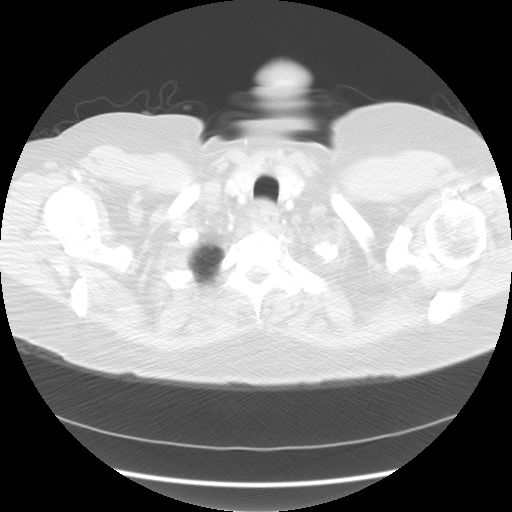

[Series 400: cor · coronal · 0.70mm/px · 3 of 109 slices shown]
[im 22/109  lung]
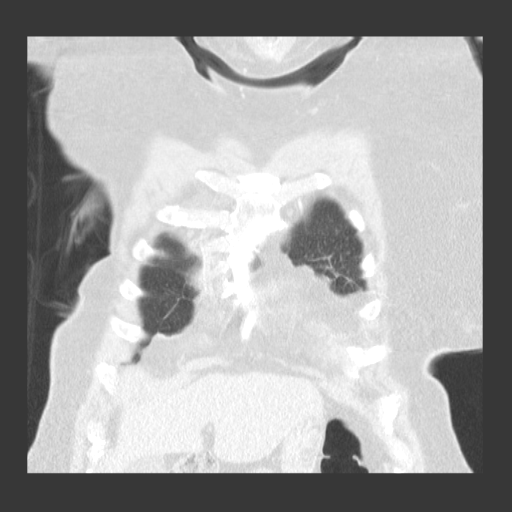
[im 44/109  lung]
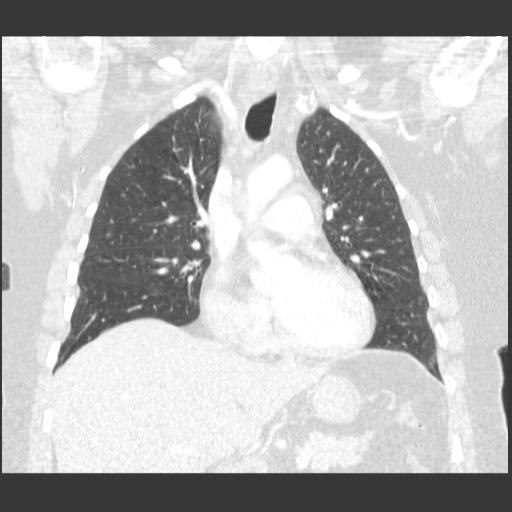
[im 65/109  lung]
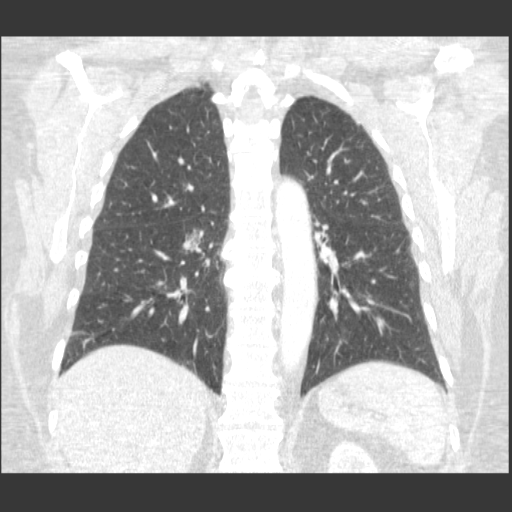

[15 of 36 positions shown; findings below may reference images not displayed]

FINDINGS: Changes of right mastectomy and right axillary lymph node
dissection noted.  11 mm nodule is noted in the right lower lobe
corresponding to the density seen on chest x-ray.  This is
noncalcified and well circumscribed.  There is also mild
mediastinal adenopathy, new since prior study
pretracheal/precarinal lymph node measures 20 x 14 mm.  No hilar
adenopathy or axillary adenopathy bilaterally.  Area of ill-defined
soft tissue is noted in the superior mediastinum to the right of
the upper esophagus.  This could reflect an enlarged lymph node
although this is not well defined.  This measures approximately 18
x 13 mm on image 7.  Other small pretracheal and paratracheal lymph
nodes are noted.

No additional pulmonary nodules or focal airspace opacities.  No
pleural effusions.

Heart is normal size. Aorta is normal caliber. Imaging into the
upper abdomen shows no acute findings.  No acute bony abnormality.
IMPRESSION: 11 mm well circumscribed rounded nodule in the posterior right
lower lobe corresponding to the abnormality on chest x-ray.  There
also mildly enlarged mediastinal lymph nodes.  These findings are
new since 6559.  Given the history of prior breast cancer,
metastases cannot be excluded.  I would recommend further
evaluation with PET CT.

## 2012-02-27 ENCOUNTER — Other Ambulatory Visit (HOSPITAL_BASED_OUTPATIENT_CLINIC_OR_DEPARTMENT_OTHER): Payer: Medicaid Other | Admitting: Lab

## 2012-02-27 ENCOUNTER — Telehealth: Payer: Self-pay | Admitting: *Deleted

## 2012-02-27 ENCOUNTER — Encounter: Payer: Self-pay | Admitting: *Deleted

## 2012-02-27 ENCOUNTER — Ambulatory Visit (HOSPITAL_BASED_OUTPATIENT_CLINIC_OR_DEPARTMENT_OTHER): Payer: Medicaid Other | Admitting: Family

## 2012-02-27 ENCOUNTER — Telehealth: Payer: Self-pay | Admitting: Oncology

## 2012-02-27 ENCOUNTER — Other Ambulatory Visit: Payer: Self-pay | Admitting: *Deleted

## 2012-02-27 ENCOUNTER — Encounter: Payer: Self-pay | Admitting: Family

## 2012-02-27 VITALS — BP 109/73 | HR 94 | Temp 98.0°F | Ht 65.0 in | Wt 189.7 lb

## 2012-02-27 DIAGNOSIS — C50919 Malignant neoplasm of unspecified site of unspecified female breast: Secondary | ICD-10-CM

## 2012-02-27 DIAGNOSIS — Z170 Estrogen receptor positive status [ER+]: Secondary | ICD-10-CM

## 2012-02-27 DIAGNOSIS — C782 Secondary malignant neoplasm of pleura: Secondary | ICD-10-CM

## 2012-02-27 LAB — CBC WITH DIFFERENTIAL/PLATELET
Basophils Absolute: 0.1 10*3/uL (ref 0.0–0.1)
Eosinophils Absolute: 0.2 10*3/uL (ref 0.0–0.5)
HGB: 12.9 g/dL (ref 11.6–15.9)
LYMPH%: 15.3 % (ref 14.0–49.7)
MCV: 78.9 fL — ABNORMAL LOW (ref 79.5–101.0)
MONO#: 0.6 10*3/uL (ref 0.1–0.9)
NEUT#: 4.4 10*3/uL (ref 1.5–6.5)
Platelets: 309 10*3/uL (ref 145–400)
RBC: 5.02 10*6/uL (ref 3.70–5.45)
RDW: 15.1 % — ABNORMAL HIGH (ref 11.2–14.5)
WBC: 6.2 10*3/uL (ref 3.9–10.3)

## 2012-02-27 LAB — COMPREHENSIVE METABOLIC PANEL
Albumin: 3.8 g/dL (ref 3.5–5.2)
BUN: 14 mg/dL (ref 6–23)
CO2: 22 mEq/L (ref 19–32)
Glucose, Bld: 106 mg/dL — ABNORMAL HIGH (ref 70–99)
Potassium: 4 mEq/L (ref 3.5–5.3)
Sodium: 138 mEq/L (ref 135–145)
Total Protein: 7.9 g/dL (ref 6.0–8.3)

## 2012-02-27 LAB — CK: Total CK: 71 U/L (ref 7–177)

## 2012-02-27 LAB — LIPID PANEL: Cholesterol: 210 mg/dL — ABNORMAL HIGH (ref 0–200)

## 2012-02-27 LAB — BILIRUBIN, DIRECT: Bilirubin, Direct: <0.1 mg/dL (ref 0.0–0.3)

## 2012-02-27 LAB — URIC ACID: Uric Acid, Serum: 3.5 mg/dL (ref 2.4–7.0)

## 2012-02-27 LAB — PHOSPHORUS: Phosphorus: 3.1 mg/dL (ref 2.3–4.6)

## 2012-02-27 MED ORDER — LETROZOLE 2.5 MG PO TABS: 2.5000 mg | ORAL_TABLET | Freq: Every day | ORAL | Status: DC

## 2012-02-27 MED ORDER — INV-EVEROLIMUS (RAD0001) 5MG TABLET NOVARTIS CRAD001Y24135: 2.0000 | ORAL_TABLET | Freq: Every day | ORAL | Status: DC

## 2012-02-27 NOTE — Progress Notes (Signed)
02/27/12 at 11:50am - The pt was into the cancer center this am for her fasting study-required labs.  The pt also returned her cycle 5 study drug kit (everolimus).  The pt confirmed that she took both the everolimus and the letrozole daily from 01/29/12 through 02/26/12.  The pt took 58 pills and returned 12 pills (29 daysx2 doses=58 pills taken / 70 pills dispensed - 58 taken pills = 12 remaining pills).  The pt is 100% compliant in her daily dosing of her study drugs.  The everolimus study drug kit was taken to the pharmacy for the drug accountability verification by the pharmacist.  The pt will return to the clinic in the afternoon for her physical exam with Dr. Donnie Coffin.    02/27/12 at 2:53pm- The pt was back into the clinic this afternoon for her physical exam and to receive her new everolimus box for cycle 6.  The pt was seen and examined by Colman Cater, NP.  Dr. Donnie Coffin had met the pt earlier in the morning.  He was tending to an emergency during her afternoon appointment.  The pt's labs were all reviewed with her.  Her labs were all within normal limits.  The pt's fasting lipid panel is still pending.  The pt confirmed that she is still taking her Lopid daily.  She denies any other medication changes.  The pt continues to have intermittent nausea which is not affecting her eating.  She specifically denies any mouth sores.  She is continuing to have some intermittent diarrhea (mainly in the am) which she is utilizing Immodium prn.  She has started taking the Lopid later in the day and her diarrhea has gotten some better.  She also complains of the following adverse events as ongoing:  Hot flashes, memory impairment, and arthritis in her hands.  She denies any headaches.  She states that she has noticed some vision changes mainly her left eye.  The pt describes her left eye vision as "cloudy".  The pt states she was told in the past that she has a cataract in the left eye, but it would not need surgery for "many  years".  The pt is wondering if the cataract has worsened.  The pt was advised to see her eye doctor for further evaluation.  The pt denies the vision changes are limiting any instrumental ADL's.  Blurred vision- grade 1.  The pt was dispensed her new cycle 6 everolimus drug kit for self administration.  The pt was instructed to continue taking her 2 pills (total dose of 10mg ) of everolimus daily along her 1 letrozole pill (dose of 2.5mg ).   The pt verbalized understanding.  The pt was given her appointments for cycle 7 and her CT appointment for August.  The pt will be called tomorrow with her lipid panel results.  02/27/12 at 4:30pm - The pt's lipid panel came back this afternoon.  The pt's results were stable.  The research nurse called and informed her that her lipid panel was stable.

## 2012-02-27 NOTE — Telephone Encounter (Signed)
gv pt appt schedule for August including ct for 8/27. PR out 8/29 and per [facility] in research ok to schedule pt to see NR.

## 2012-02-27 NOTE — Progress Notes (Signed)
Hematology and Oncology Follow Up Visit  Barbara Parks [id number] 1950-10-19 61 y.o. 01/29/12    HPI: 61 year old [name] with newly diagnosed metastatic ER positive breast carcinoma with involvement of the right pleura and pericardium. She is participating in the [place] study, for which she is taking letrozole 2.5 mg per day and Afinitor 10 mg per day.  Has completed cycle 5. Has had mild-moderate elevation of ALT and mild-moderate elevation of trigylerides.  Interim History:   Barbara Parks is seen today with her research nurse for followup and tolerance check of letrozole 2.5 mg per day given in combination with Afinitor 10 mg per day per the BOLERO-4  study. She is due to initiate day 1 cycle 6 today.   Chief complaint is arthralgias in the hands, left greater than right. Has left hip pain, chronic arthritis. No headache, report "cloudiness" in the left eye, has known cataract. She will see assessment by opthalmology. No blurred vision. No cough or shortness of breath. No abdominal pain or new bone pain. Bowel and bladder function are normal for the last 2 days. Had intermittent diarrhea which she has isolated as a result of the Lopid. A change in dose timing has alleviated the diarrhea. Appetite is good, with adequate fluid intake.  She denies any bleeding or bruising symptoms.  She denies any neuropathy symptoms. No upper or lower extremity diarrhea. Hot flashes have improved, having 1-2 daily. Remainder of the 10 point  review of systems is negative.   Medications:   I have reviewed the patient's current medications.    Allergies:  Allergies  Allergen Reactions  . Aspirin     REACTION: upset stomach  . Latex Other (See Comments)    Blistering and skin peels off  . Nsaids Nausea And Vomiting    Extreme nausea and vomiting    Physical Exam: Filed Vitals:   02/27/12 1326  BP: 109/73  Pulse: 94  Temp: 98 F (36.7 C)    Body mass index is 31.57  kg/(m^2). Weight: 191 lbs. HEENT:  Sclerae anicteric, conjunctivae pink.  Oropharynx clear.  No mucositis or candidiasis.  No evidence of thyromegaly. Nodes:  No cervical, supraclavicular, or axillary lymphadenopathy palpated. Breast Exam: BREAST EXAM: In the supine position, with the right arm over the head, the right breast is surgically absent. Scar is flat, no nodularity or skin nodules. No redness of the skin. No right axillary adenopathy. With the left arm over the head, the left nipple is everted. No periareolar edema or nipple discharge. No mass in any quadrant or subareolar region, prominent inframammary fold which extends to the nipple base. No redness of the skin. No left axillary adenopathy.   Lungs:  Clear to auscultation bilaterally, diminished in the right base. No crackles, rhonchi, or wheezes.   Heart:  Regular rate and rhythm, no murmurs rubs gallops or clicks appreciated. ABD: Soft, nontender without organomegaly. No distention. EXT:  No cyanosis, or ecchymoses. No rash is evident. No evidence of pedal edema. NEURO: Alert and oriented x3 without any focal deficits.  ECOG: 0  Lab Results: Lab Results  Component Value Date   WBC 6.2 02/27/2012   HGB 12.9 02/27/2012   HCT 39.6 02/27/2012   MCV 78.9* 02/27/2012   PLT 309 02/27/2012   NEUTROABS 4.4 02/27/2012     Chemistry      Component Value Date/Time   NA 138 02/27/2012 1047   K 4.0 02/27/2012 1047   CL 105 02/27/2012 1047  CO2 22 02/27/2012 1047   BUN 14 02/27/2012 1047   CREATININE 0.76 02/27/2012 1047      Component Value Date/Time   CALCIUM 10.0 02/27/2012 1047   ALKPHOS 149* 02/27/2012 1047   AST 42* 02/27/2012 1047   ALT 42* 02/27/2012 1047   BILITOT 0.2* 02/27/2012 1047       Assessment: 61 year old [name] with  1. Metastatic ER positive breast carcinoma with involvement of the right pleura and pericardium. On Afinitor and Letrozole with good tolerance.  2. Diarrhea, resolved.  3. Grade 1 elevation of  SGOT/SGPT 4. Grade 2 elevation of trigylerides 5. Stable disease. No recurrence of pleural effusion.   PLAN:  1. Continue Lopid daily. 2. CT chest abdomen pelvis with contrast per protocol before next visit.  3. Return 8/29 for visit with Barbara Parks with lab prior.  4. Continue Afinitor at 10mg  po daily, letrozole 2.5mg  po daily    Case reviewed with Dr. Pierce Crane and he will sign the treatment plan for protocol.

## 2012-02-27 NOTE — Patient Instructions (Signed)
Per protocol and research nurse.

## 2012-02-27 NOTE — Telephone Encounter (Signed)
Gave patient appointment for 03-26-2012 with the lab on the same day per [facility] research nurse patient needs to labs on the same day she see the NP per [facility] the research nurse she will contact the patient and let the patient know about the change in the patient lab appointment

## 2012-03-06 ENCOUNTER — Telehealth: Payer: Self-pay | Admitting: *Deleted

## 2012-03-06 NOTE — Telephone Encounter (Signed)
03/06/12 at 4:28pm - The pt called the research nurse late in the afternoon on 03/05/12.  The pt stated she just returned from the eye doctor.  She said that her left cataract has "progressed unusually rapid" per the physician. She also added that she now has a right cataract.  The nurse emailed the physician with the above information.  The nurse discussed the patient's status with Dr. Donnie Coffin in person this am.  He said that he was uncertain if the progression of her left cataract and the formation of her right eye cataract was related to her study drugs.  He advised the research nurse to contact Dr. Katherina Right, [employer] Medical Director of [employer], with this new pt information.  The research nurse discussed this pt situation with Isidoro Donning, study coordinator, and Marilu Favre, Teacher, English as a foreign language.  The research nurse sent emails to Trudie Buckler, study CRA, [place] [facility] [employer], Bubba Hales, expert clinical manager, and Dr. Niel Hummer Ringeisen about the situation. The research nurse also followed the emails with voice messages to [facility] and [employer] [place].   The research nurse also spoke to the pt on the phone today about her doctor's visit on 03/05/12.  The pt said that she was seen by Dr. Doretha Imus at the [facility] in [place], [place].  She said that he felt that most eye surgeons would recommend cataract surgery, but she did not want any surgical interventions now.  She was given a prescription to help with her distance vision in the meantime. The research nurse contacted the [facility] vision center and requested the pt's notes from this visit.  Rn was told that this information will be faxed to the [facility] on Monday, August 12th.  Rn will await further direction from the [employer] study team regarding.

## 2012-03-10 ENCOUNTER — Telehealth: Payer: Self-pay | Admitting: Oncology

## 2012-03-10 NOTE — Telephone Encounter (Signed)
Moved 8/29 appt to 8/28 due to NR out of office. D/t/NR ok per email from NR/Nikki. lmonvm for pt re change w/new d/t and mailed schedule.

## 2012-03-24 ENCOUNTER — Other Ambulatory Visit: Payer: Self-pay | Admitting: Lab

## 2012-03-24 ENCOUNTER — Ambulatory Visit (HOSPITAL_COMMUNITY)
Admission: RE | Admit: 2012-03-24 | Discharge: 2012-03-24 | Disposition: A | Discharge status: Home / Self Care | Payer: Medicaid Other | Source: Ambulatory Visit | Attending: Family | Admitting: Family

## 2012-03-24 DIAGNOSIS — C50919 Malignant neoplasm of unspecified site of unspecified female breast: Secondary | ICD-10-CM | POA: Insufficient documentation

## 2012-03-24 DIAGNOSIS — I700 Atherosclerosis of aorta: Secondary | ICD-10-CM | POA: Insufficient documentation

## 2012-03-24 DIAGNOSIS — R0602 Shortness of breath: Secondary | ICD-10-CM | POA: Insufficient documentation

## 2012-03-24 DIAGNOSIS — K7689 Other specified diseases of liver: Secondary | ICD-10-CM | POA: Insufficient documentation

## 2012-03-24 DIAGNOSIS — J984 Other disorders of lung: Secondary | ICD-10-CM | POA: Insufficient documentation

## 2012-03-24 DIAGNOSIS — J90 Pleural effusion, not elsewhere classified: Secondary | ICD-10-CM | POA: Insufficient documentation

## 2012-03-24 DIAGNOSIS — K5730 Diverticulosis of large intestine without perforation or abscess without bleeding: Secondary | ICD-10-CM | POA: Insufficient documentation

## 2012-03-24 DIAGNOSIS — M948X9 Other specified disorders of cartilage, unspecified sites: Secondary | ICD-10-CM | POA: Insufficient documentation

## 2012-03-24 MED ORDER — IOHEXOL 300 MG/ML  SOLN
100.0000 mL | Freq: Once | INTRAMUSCULAR | Status: AC | PRN
  Administered 2012-03-24: 100 mL via INTRAVENOUS

## 2012-03-25 ENCOUNTER — Encounter: Payer: Self-pay | Admitting: Family

## 2012-03-25 ENCOUNTER — Ambulatory Visit (HOSPITAL_BASED_OUTPATIENT_CLINIC_OR_DEPARTMENT_OTHER): Payer: Medicaid Other | Admitting: Family

## 2012-03-25 ENCOUNTER — Other Ambulatory Visit: Payer: Self-pay | Admitting: *Deleted

## 2012-03-25 ENCOUNTER — Other Ambulatory Visit (HOSPITAL_BASED_OUTPATIENT_CLINIC_OR_DEPARTMENT_OTHER): Payer: Medicaid Other | Admitting: Lab

## 2012-03-25 ENCOUNTER — Encounter: Payer: Self-pay | Admitting: *Deleted

## 2012-03-25 VITALS — BP 119/80 | HR 97 | Temp 98.0°F | Resp 20 | Ht 65.0 in | Wt 188.2 lb

## 2012-03-25 DIAGNOSIS — C50919 Malignant neoplasm of unspecified site of unspecified female breast: Secondary | ICD-10-CM

## 2012-03-25 DIAGNOSIS — C782 Secondary malignant neoplasm of pleura: Secondary | ICD-10-CM

## 2012-03-25 DIAGNOSIS — Z170 Estrogen receptor positive status [ER+]: Secondary | ICD-10-CM

## 2012-03-25 LAB — CBC WITH DIFFERENTIAL/PLATELET
Basophils Absolute: 0 10*3/uL (ref 0.0–0.1)
EOS%: 2.6 % (ref 0.0–7.0)
Eosinophils Absolute: 0.1 10*3/uL (ref 0.0–0.5)
HGB: 13.8 g/dL (ref 11.6–15.9)
MCH: 26.1 pg (ref 25.1–34.0)
MONO#: 0.6 10*3/uL (ref 0.1–0.9)
NEUT#: 3.8 10*3/uL (ref 1.5–6.5)
RDW: 14.4 % (ref 11.2–14.5)
WBC: 5.6 10*3/uL (ref 3.9–10.3)
lymph#: 1.1 10*3/uL (ref 0.9–3.3)

## 2012-03-25 LAB — COMPREHENSIVE METABOLIC PANEL (CC13)
ALT: 31 U/L (ref 0–55)
Albumin: 3.4 g/dL — ABNORMAL LOW (ref 3.5–5.0)
CO2: 21 mEq/L — ABNORMAL LOW (ref 22–29)
Glucose: 117 mg/dl — ABNORMAL HIGH (ref 70–99)
Sodium: 139 mEq/L (ref 136–145)
Total Bilirubin: 0.2 mg/dL (ref 0.20–1.20)

## 2012-03-25 LAB — LIPID PANEL
Cholesterol: 245 mg/dL — ABNORMAL HIGH (ref 0–200)
HDL: 45 mg/dL (ref >39)

## 2012-03-25 LAB — BILIRUBIN, DIRECT: Bilirubin, Direct: <0.1 mg/dL (ref 0.0–0.3)

## 2012-03-25 LAB — CK: Total CK: 74 U/L (ref 7–177)

## 2012-03-25 MED ORDER — INV-EVEROLIMUS (RAD0001) 5MG TABLET NOVARTIS CRAD001Y24135: 2.0000 | ORAL_TABLET | Freq: Every day | ORAL | Status: DC

## 2012-03-25 NOTE — Patient Instructions (Signed)
Return 9/26 to see Dr. Donnie Coffin with lab prior.

## 2012-03-25 NOTE — Progress Notes (Signed)
03/25/12 at 3:35pm- Bolero 4 - cycle 7, day 1 assessments- The pt was into the office this am for her cycle 7, day 1 assessments.  The pt confirmed that she had been fasting prior to her labs this am.  The pt returned her everolimus cycle 6 kit.  The pt confirmed that she took her pills from 02/27/12 through 03/24/12 (27 days).  She confirmed that she took 2 pills of everolimus daily (27x2=54 doses).  The pt returned 16 unopened blister packs today.  She was dispensed 70 pills on 02/27/12 (70-54=16 pills).  Therefore, the pt was 100% compliant.  The pt confirmed that she is also taking her letrozole daily along with the everolimus.  The pt was seen and examined this am by Colman Cater, NP.  The pt is performing all of her usual activities.  She is also planning to drive to 8 hours to visit her family on Friday.  ECOG =0.  The pt's weight was obtained (shoeless) and her 3 sets of BP's were taken after the pt was seated for 5 minutes.  The nurse reviewed the pt's current medications.  She states she has begun taking ibuprofen  (2 tabs =400mg ) prn)for her hand arthralgia on 02/28/12.  She also reports the following AE's as ongoing:  hot flashes, memory impairment, cataracts (both eyes), nausea, diarrhea, and arthritis in hands.  The pt stated she developed the following mild AE's on 03/15/12: fatigue, headache, and lightheadedness.  The pt was told to remain at her current doses of study drugs.  The pt will be seen on 04/21/12 for cycle 8.  The pt was dispensed a new everolimus kit for cycle 7 for self administration.    03/25/12 at 4:05pm- The pt's triglycerides were noted to be increased from a grade 1 to a grade 2 toxicity.  Dr. Donnie Coffin was made aware of the situation.  He asked our board-certified pharmacist to suggest other drug therapies to reduce her triglycerides.  The nurse spoke to the pt by phone.  The pt stated that she did not take her Lopid as prescribed last week.  She said she took only 1 dose a "couple of days  last week".  The pt was strongly encouraged to take her Lopid as prescribed.  She said that she will ensure that she takes it twice daily.  The pharmacist said most of the triglyceride-lowering medications cause some GI distress.  The pt takes Prilosec as suggested by Dr. Donnie Coffin.  The pt said that she took 2 doses of Lopid yesterday without difficulty.  The pt was given her appointment dates/times for 04/21/12.  The pt denied any new adverse events.

## 2012-03-25 NOTE — Progress Notes (Signed)
Hematology and Oncology Follow Up Visit  Barbara Parks [id number] [birth year] 61 y.o. 01/29/12    HPI: 61 year old Barbara Parks woman with newly diagnosed metastatic ER positive breast carcinoma with involvement of the right pleura and pericardium. She is participating in the [place] study, for which she is taking letrozole 2.5 mg per day and Afinitor 10 mg per day.  Has completed cycle 5. Has had mild-moderate elevation of ALT and mild-moderate elevation of trigylerides.  Interim History:   Barbara Parks is seen today with her research nurse for followup and tolerance check of letrozole 2.5 mg per day given in combination with Afinitor 10 mg per day per the BOLERO-4  study. She is due to initiate day 1 cycle 7 today.   Chief complaint is arthralgias in the hands, left greater than right. Takes ibuprofen daily. Has left hip pain, chronic arthritis. Occasional "nagging" headache, cataracts are progressing per opthalmology exam since last visit. No cough or shortness of breath. No abdominal pain or new bone pain. Bowel and bladder function are normal, uses Immodium for occasional loose stools . Appetite is good, with adequate fluid intake.  No bleeding or bruising symptoms.  No neuropathy symptoms. No upper or lower extremity edema. Hot flashes are stable, having 2-3 daily. Had a bout of lightheadedness and fatigue last week with nausea, she feels it was a "stomach virus".  Remainder of the 10 point  review of systems is negative.   Medications:   I have reviewed the patient's current medications.    Allergies:  Allergies  Allergen Reactions  . Aspirin     REACTION: upset stomach  . Latex Other (See Comments)    Blistering and skin peels off  . Nsaids Nausea And Vomiting    Extreme nausea and vomiting    Physical Exam: Filed Vitals:   03/25/12 0953  BP: 119/80  Pulse: 97  Temp:   Resp:     Body mass index is 31.32 kg/(m^2). Weight: 191 lbs. HEENT:  Sclerae anicteric,  conjunctivae pink.  Oropharynx clear.  No mucositis or candidiasis.   Nodes:  No cervica or supraclavicular lymphadenopathy. Breast Exam: Performed at last visit.  Lungs:  Clear to auscultation bilaterally, diminished in the right base. No crackles, rhonchi, or wheezes.   Heart:  Regular rate and rhythm, no murmurs rubs gallops or clicks appreciated. ABD: Soft, nontender without organomegaly. No distention. EXT:  No cyanosis, or ecchymoses. No rash is evident. No evidence of pedal edema. NEURO: Alert and oriented x 3 without any focal deficits.  ECOG: 0  Lab Results: Lab Results  Component Value Date   WBC 5.6 03/25/2012   HGB 13.8 03/25/2012   HCT 40.9 03/25/2012   MCV 77.4* 03/25/2012   PLT 273 03/25/2012   NEUTROABS 3.8 03/25/2012     Chemistry      Component Value Date/Time   NA 138 02/27/2012 1047   K 4.0 02/27/2012 1047   CL 105 02/27/2012 1047   CO2 22 02/27/2012 1047   BUN 14 02/27/2012 1047   CREATININE 0.76 02/27/2012 1047      Component Value Date/Time   CALCIUM 10.0 02/27/2012 1047   ALKPHOS 149* 02/27/2012 1047   AST 42* 02/27/2012 1047   ALT 42* 02/27/2012 1047   BILITOT 0.2* 02/27/2012 1047       Assessment: 61 year old [name] with  1. Metastatic ER positive breast carcinoma with involvement of the right pleura and pericardium. On Afinitor and Letrozole with good tolerance.  2. Arthralgias, bilateral hands. 3. Grade 1 elevation of SGOT/SGPT on previous CMET. Today's CMET pending.  4. Grade 2 elevation of trigylerides 5. Stable disease on CT done 8/27. Has now achieved 30% partial response. No progression of stable right pleural effusion.   PLAN:  1. Continue Lopid daily. 2. Return 9/26 for visit with Dr. Donnie Coffin with lab prior.  3. Will check with pharmacy about possible Voltaren gel for arthralgias.  4. Continue Afinitor at 10mg  po daily, letrozole 2.5mg  po daily  Dr. Pierce Crane will sign the treatment plan for protocol.

## 2012-03-26 ENCOUNTER — Other Ambulatory Visit: Payer: Self-pay | Admitting: Lab

## 2012-03-26 ENCOUNTER — Ambulatory Visit: Payer: Self-pay | Admitting: Family

## 2012-03-27 ENCOUNTER — Telehealth: Payer: Self-pay | Admitting: *Deleted

## 2012-03-27 NOTE — Telephone Encounter (Signed)
Per schedule the patient has been placed on md schedule for 04-21-2012 labs and md md will be on vacation on the 04-23-2012

## 2012-04-08 ENCOUNTER — Encounter: Payer: Self-pay | Admitting: *Deleted

## 2012-04-08 NOTE — Progress Notes (Signed)
04/08/12 @ 14:00, BOLERO 4, Letrozole Supply:  Barbara Parks called to says she only has 5 letrozole tablets left and her appointment is not until 04/21/12.  Obtained a 30 day supply of sample Femara from the pharmacy and gave it to the patient. Alerted her to the fact that this bottle of drug expires on 04/27/12, but it will be adequate for her to complete the cycle.  She says that she has a medication card that enables her to obtain the letrozole for $24.00 per month.  Will explore patient assistance for her, since the pharmacist communicated it is doubtful that we will receive more samples.

## 2012-04-21 ENCOUNTER — Other Ambulatory Visit (HOSPITAL_BASED_OUTPATIENT_CLINIC_OR_DEPARTMENT_OTHER): Payer: Medicaid Other | Admitting: Lab

## 2012-04-21 ENCOUNTER — Encounter: Payer: Self-pay | Admitting: *Deleted

## 2012-04-21 ENCOUNTER — Ambulatory Visit (HOSPITAL_BASED_OUTPATIENT_CLINIC_OR_DEPARTMENT_OTHER): Payer: Medicaid Other | Admitting: Oncology

## 2012-04-21 ENCOUNTER — Telehealth: Payer: Self-pay | Admitting: *Deleted

## 2012-04-21 VITALS — BP 122/81 | HR 109 | Temp 97.8°F | Resp 20 | Ht 65.0 in | Wt 186.4 lb

## 2012-04-21 DIAGNOSIS — C778 Secondary and unspecified malignant neoplasm of lymph nodes of multiple regions: Secondary | ICD-10-CM

## 2012-04-21 DIAGNOSIS — M2550 Pain in unspecified joint: Secondary | ICD-10-CM

## 2012-04-21 DIAGNOSIS — Z170 Estrogen receptor positive status [ER+]: Secondary | ICD-10-CM

## 2012-04-21 DIAGNOSIS — C50919 Malignant neoplasm of unspecified site of unspecified female breast: Secondary | ICD-10-CM

## 2012-04-21 DIAGNOSIS — C50419 Malignant neoplasm of upper-outer quadrant of unspecified female breast: Secondary | ICD-10-CM

## 2012-04-21 LAB — LIPID PANEL
HDL: 48 mg/dL (ref >39)
LDL Cholesterol: 140 mg/dL — ABNORMAL HIGH (ref 0–99)
Total CHOL/HDL Ratio: 4.9 Ratio

## 2012-04-21 LAB — CBC WITH DIFFERENTIAL/PLATELET
Basophils Absolute: 0 10*3/uL (ref 0.0–0.1)
EOS%: 2 % (ref 0.0–7.0)
Eosinophils Absolute: 0.1 10*3/uL (ref 0.0–0.5)
HCT: 41.7 % (ref 34.8–46.6)
HGB: 13.9 g/dL (ref 11.6–15.9)
LYMPH%: 14.2 % (ref 14.0–49.7)
MCH: 26.1 pg (ref 25.1–34.0)
MCV: 78.2 fL — ABNORMAL LOW (ref 79.5–101.0)
MONO%: 10.5 % (ref 0.0–14.0)
NEUT#: 5.1 10*3/uL (ref 1.5–6.5)
NEUT%: 72.6 % (ref 38.4–76.8)
Platelets: 300 10*3/uL (ref 145–400)
RDW: 14.1 % (ref 11.2–14.5)

## 2012-04-21 LAB — COMPREHENSIVE METABOLIC PANEL (CC13)
AST: 34 U/L (ref 5–34)
Albumin: 3.5 g/dL (ref 3.5–5.0)
Alkaline Phosphatase: 147 U/L (ref 40–150)
BUN: 17 mg/dL (ref 7.0–26.0)
Creatinine: 0.9 mg/dL (ref 0.6–1.1)
Glucose: 136 mg/dl — ABNORMAL HIGH (ref 70–99)
Potassium: 4.1 mEq/L (ref 3.5–5.1)

## 2012-04-21 LAB — CK: Total CK: 87 U/L (ref 7–177)

## 2012-04-21 LAB — PHOSPHORUS: Phosphorus: 3.5 mg/dL (ref 2.3–4.6)

## 2012-04-21 MED ORDER — LETROZOLE 2.5 MG PO TABS: 2.5000 mg | ORAL_TABLET | Freq: Every day | ORAL | Status: DC

## 2012-04-21 NOTE — Progress Notes (Signed)
04/21/12 at 10:12am- The pt was into the cancer center this am for her cycle 8 assessments.  She confirmed that she was fasting prior to her labs being drawn this am.  The pt's vitals and her weight (shoeless) were obtained today.  Her BP was taken 3 times after the pt was seated for 5 minutes.  The pt was seen and examined this am by Dr. Donnie Coffin. Dr. Donnie Coffin reviewed her labs, and he reassured her that her lab values were all within acceptable ranges.  He stated that all of her lab abnormals were "not clinically significant".  The pt states she still has some mild fatigue, but she is able to perform all of her usual activities.  She states she washed all of her windows yesterday and cleaned her car (inside and outside).  ECOG=0.  The research nurse reviewed all of the pt's current concomitant medications with the pt.  The pt states she is taking the following medications:  Lopressor, prilosec, compazine, immodium, Lopid, and ibuprofen.  The pt said that she is not taking the Tussinex anymore.  She also started slow iron fe-  1 tab daily on 03/25/12 (advised by Colman Cater, NP).  Dr. Donnie Coffin instructed the pt to stop taking this supplement today because she has no diagnosis/indication that she needs supplemental iron.  The pt reports the following adverse events as ongoing/intermittent;  hot flashes, memory impairment, nausea, diarrhea, arthritis in hands,and bilateral cataracts.  The pt said that her lightheadedness/dizziness has resolved.  The pt returned her cycle 7 everolimus study drug kit with 16 unopened blister packs.  She confirmed that she took 2 tabs of everolimus from 03/25/12 through 04/20/12 (27 days x 2 =54).  The pt returned 54 opened blister packs of everolimus.  Therefore, the pt has taken her everolimus as prescribed and is 100% compliant.  She was given a new cycle 8 everolimus kit.  The pt was instructed to continue taking her 2 tabs of everolimus everyday with her 1 tab of letrozole.  The pt verbalized  understanding.  Dr. Donnie Coffin was asked to call in the pt's letrozole to [facility] in [place].  The pt denies any mouth sores/stomatitis.  The pt's lipid panel is pending.  The pt confirmed that she took her Lopid as prescribed.  The pt gave Dr. Renelda Loma nurse some insurance papers that he needs to complete for her policy.  The pt is aware that she will need scans for disease assessment prior to cycle 9.  Her next appointments for October 2013 were given to the pt.    04/22/12 at 10:41am - The lipid panel results were available this am. The pt's triglycerides were down to 246 from 436.  The research nurse called the pt and informed her that taking her Lopid as prescribed definitely keeps her triglycerides down to an acceptable level.

## 2012-04-21 NOTE — Telephone Encounter (Signed)
GAVE PATIENT APPOINTMENT FOR 05-19-2012 CT SCAN (PATIENT REQUESTED TO DRINK THE WATER BASE CONTRAST)  05-21-2012 STARTING AT 10:30AM  PATIENT AWARE OF ALL THE APPOINTMENTS

## 2012-04-21 NOTE — Progress Notes (Signed)
Hematology and Oncology Follow Up Visit  Barbara Parks [id number] 09-Jul-1951 61 y.o. 01/29/12    HPI: 61 year old Barbara Parks woman with newly diagnosed metastatic ER positive breast carcinoma with involvement of the right pleura and pericardium. She is participating in the [place] study, for which she is taking letrozole 2.5 mg per day and Afinitor 10 mg per day.  Has completed cycle 7. Has had mild-moderate elevation of ALT and mild-moderate elevation of trigylerides.  Interim History:   Barbara Parks is seen today with her research nurse for followup and tolerance check of letrozole 2.5 mg per day given in combination with Afinitor 10 mg per day per the BOLERO-4  study. She is due to initiate day 1 cycle 8 today.   Chief complaint is arthralgias in the hands, left greater than right. Takes ibuprofen daily. Has left hip pain, chronic arthritis. Occasional "nagging" headache, cataracts are stable per opthalmology exam since last visit. No cough or shortness of breath. No abdominal pain or new bone pain. Bowel and bladder function are normal, uses Immodium for occasional loose stools . Appetite is good, with adequate fluid intake.  No bleeding or bruising symptoms.  No neuropathy symptoms. No upper or lower extremity edema. Hot flashes are stable, having 2-3 daily. She has been taking Lopid without major side effects. She is taking it with food the mornings, as well as the evening. Medications:   I have reviewed the patient's current medications.    Allergies:  Allergies  Allergen Reactions  . Aspirin     REACTION: upset stomach  . Latex Other (See Comments)    Blistering and skin peels off  . Nsaids Nausea And Vomiting    Extreme nausea and vomiting    Physical Exam:  Filed Vitals:   04/21/12 0848  BP: 122/81  Pulse:   Temp:   Resp:     Body mass index is 31.02 kg/(m^2). Weight: 191 lbs. HEENT:  Sclerae anicteric, conjunctivae pink.  Oropharynx clear.  No mucositis or  candidiasis.   Nodes:  No cervica or supraclavicular lymphadenopathy. Breast Exam: Performed at last visit.  Lungs:  Clear to auscultation bilaterally, diminished in the right base. No crackles, rhonchi, or wheezes.   Heart:  Regular rate and rhythm, no murmurs rubs gallops or clicks appreciated. ABD: Soft, nontender without organomegaly. No distention. EXT:  No cyanosis, or ecchymoses. No rash is evident. No evidence of pedal edema. NEURO: Alert and oriented x 3 without any focal deficits.  ECOG: 0  Lab Results: Lab Results  Component Value Date   WBC 7.0 04/21/2012   HGB 13.9 04/21/2012   HCT 41.7 04/21/2012   MCV 78.2* 04/21/2012   PLT 300 04/21/2012   NEUTROABS 5.1 04/21/2012     Chemistry      Component Value Date/Time   NA 140 04/21/2012 0826   NA 138 02/27/2012 1047   K 4.1 04/21/2012 0826   K 4.0 02/27/2012 1047   CL 108* 04/21/2012 0826   CL 105 02/27/2012 1047   CO2 20* 04/21/2012 0826   CO2 22 02/27/2012 1047   BUN 17.0 04/21/2012 0826   BUN 14 02/27/2012 1047   CREATININE 0.9 04/21/2012 0826   CREATININE 0.76 02/27/2012 1047      Component Value Date/Time   CALCIUM 10.5* 04/21/2012 0826   CALCIUM 10.0 02/27/2012 1047   ALKPHOS 147 04/21/2012 0826   ALKPHOS 149* 02/27/2012 1047   AST 34 04/21/2012 0826   AST 42* 02/27/2012 1047   ALT  39 04/21/2012 0826   ALT 42* 02/27/2012 1047   BILITOT 0.30 04/21/2012 0826   BILITOT 0.2* 02/27/2012 1047       Assessment: 61 year old [name] with  1. Metastatic ER positive breast carcinoma with involvement of the right pleura and pericardium. On Afinitor and Letrozole with good tolerance.  2. Arthralgias, bilateral hands.,  3. Grade 1 elevation of SGOT/SGPT on previous CMET. Today's CMET pending is improved 4. Grade 2 elevation of trigylerides 5. Stable disease on CT done 8/27. Has now achieved 30% partial response. No progression of stable right pleural effusion.   PLAN:  1. Continue Lopid bid 2. Return 10/24 Recommended  And  baths, chondroitin and glucosamine 4. Continue Afinitor at 10mg  po daily, letrozole 2.5mg  po daily 5. she'll have scans on 1022

## 2012-04-22 MED ORDER — LETROZOLE 2.5 MG PO TABS: 2.5000 mg | ORAL_TABLET | Freq: Every day | ORAL | Status: DC

## 2012-04-22 MED ORDER — INV-EVEROLIMUS (RAD0001) 5MG TABLET NOVARTIS CRAD001Y24135: 2.0000 | ORAL_TABLET | Freq: Every day | ORAL | Status: DC

## 2012-04-27 ENCOUNTER — Encounter: Payer: Self-pay | Admitting: Oncology

## 2012-04-27 NOTE — Progress Notes (Signed)
Faxed disability paper to [facility] @ [phone].

## 2012-05-19 ENCOUNTER — Ambulatory Visit (HOSPITAL_COMMUNITY)
Admission: RE | Admit: 2012-05-19 | Discharge: 2012-05-19 | Disposition: A | Discharge status: Home / Self Care | Payer: Self-pay | Source: Ambulatory Visit | Attending: Oncology | Admitting: Oncology

## 2012-05-19 DIAGNOSIS — R05 Cough: Secondary | ICD-10-CM | POA: Insufficient documentation

## 2012-05-19 DIAGNOSIS — R059 Cough, unspecified: Secondary | ICD-10-CM | POA: Insufficient documentation

## 2012-05-19 DIAGNOSIS — C50919 Malignant neoplasm of unspecified site of unspecified female breast: Secondary | ICD-10-CM | POA: Insufficient documentation

## 2012-05-19 DIAGNOSIS — C7800 Secondary malignant neoplasm of unspecified lung: Secondary | ICD-10-CM | POA: Insufficient documentation

## 2012-05-19 DIAGNOSIS — K7689 Other specified diseases of liver: Secondary | ICD-10-CM | POA: Insufficient documentation

## 2012-05-19 DIAGNOSIS — Z9221 Personal history of antineoplastic chemotherapy: Secondary | ICD-10-CM | POA: Insufficient documentation

## 2012-05-19 DIAGNOSIS — R0602 Shortness of breath: Secondary | ICD-10-CM | POA: Insufficient documentation

## 2012-05-19 MED ORDER — IOHEXOL 300 MG/ML  SOLN
100.0000 mL | Freq: Once | INTRAMUSCULAR | Status: AC | PRN
  Administered 2012-05-19: 100 mL via INTRAVENOUS

## 2012-05-21 ENCOUNTER — Other Ambulatory Visit (HOSPITAL_BASED_OUTPATIENT_CLINIC_OR_DEPARTMENT_OTHER): Payer: Medicaid Other | Admitting: Lab

## 2012-05-21 ENCOUNTER — Ambulatory Visit (HOSPITAL_BASED_OUTPATIENT_CLINIC_OR_DEPARTMENT_OTHER): Payer: Medicaid Other | Admitting: Oncology

## 2012-05-21 ENCOUNTER — Encounter: Payer: Self-pay | Admitting: *Deleted

## 2012-05-21 ENCOUNTER — Telehealth: Payer: Self-pay | Admitting: *Deleted

## 2012-05-21 ENCOUNTER — Other Ambulatory Visit: Payer: Self-pay | Admitting: *Deleted

## 2012-05-21 ENCOUNTER — Ambulatory Visit: Payer: Medicaid Other | Admitting: Oncology

## 2012-05-21 ENCOUNTER — Other Ambulatory Visit: Payer: Medicaid Other | Admitting: Lab

## 2012-05-21 VITALS — BP 118/83 | HR 108 | Temp 97.6°F | Resp 20 | Ht 65.0 in | Wt 186.6 lb

## 2012-05-21 DIAGNOSIS — Z170 Estrogen receptor positive status [ER+]: Secondary | ICD-10-CM

## 2012-05-21 DIAGNOSIS — C782 Secondary malignant neoplasm of pleura: Secondary | ICD-10-CM

## 2012-05-21 DIAGNOSIS — C50919 Malignant neoplasm of unspecified site of unspecified female breast: Secondary | ICD-10-CM

## 2012-05-21 DIAGNOSIS — R911 Solitary pulmonary nodule: Secondary | ICD-10-CM

## 2012-05-21 DIAGNOSIS — C50419 Malignant neoplasm of upper-outer quadrant of unspecified female breast: Secondary | ICD-10-CM

## 2012-05-21 LAB — COMPREHENSIVE METABOLIC PANEL (CC13)
ALT: 45 U/L (ref 0–55)
AST: 40 U/L — ABNORMAL HIGH (ref 5–34)
Albumin: 3.7 g/dL (ref 3.5–5.0)
Alkaline Phosphatase: 164 U/L — ABNORMAL HIGH (ref 40–150)
Potassium: 4 mEq/L (ref 3.5–5.1)
Sodium: 139 mEq/L (ref 136–145)
Total Protein: 8.2 g/dL (ref 6.4–8.3)

## 2012-05-21 LAB — CK: Total CK: 79 U/L (ref 7–177)

## 2012-05-21 LAB — CBC WITH DIFFERENTIAL/PLATELET
BASO%: 0.6 % (ref 0.0–2.0)
EOS%: 2.1 % (ref 0.0–7.0)
HCT: 40.9 % (ref 34.8–46.6)
LYMPH%: 14.3 % (ref 14.0–49.7)
MCH: 26.6 pg (ref 25.1–34.0)
MCHC: 34 g/dL (ref 31.5–36.0)
NEUT%: 70.5 % (ref 38.4–76.8)
Platelets: 288 10*3/uL (ref 145–400)
RBC: 5.23 10*6/uL (ref 3.70–5.45)

## 2012-05-21 LAB — LACTATE DEHYDROGENASE (CC13): LDH: 230 U/L — ABNORMAL HIGH (ref 125–220)

## 2012-05-21 MED ORDER — INV-EVEROLIMUS (RAD0001) 5MG TABLET NOVARTIS CRAD001Y24135: 2.0000 | ORAL_TABLET | Freq: Every day | ORAL | Status: DC

## 2012-05-21 NOTE — Progress Notes (Signed)
05/21/12 at 12:05pm - [employer] 4 cycle 9, day 1 study notes.  The pt was into the cancer center this am for her cycle 9, day 1 assessments.  She confirmed that she was fasting prior to her labs being drawn this am.  The pt also returned her cycle 8 everolimus study drug kit.  She confirmed that she took everolimus from 04/21/12 through 05/20/12 (30 days x 2 doses = 60 doses).  The patient returned 10 unopened blister packs (70 pills dispensed - 60 pills= 10 returned).  Therefore, the pt was 100% compliant with her study drug.  The pt also confirmed that she has taken letrozole 2.5mg  daily.  The pt's weight and vitals were obtained.  The pt's BP's were taken after the pt was seated for 5 minutes.  Dr. Renelda Loma nurse reviewed the pt's current medications.  The research nurse also had the pt review her [employer] concomitant medication drug log.  The pt confirmed that she is still taking the following medications:  Lopressor, Prilosec, compazine, immodium, Lopid, and ibuprofen.  The pt was seen and examined by Dr. Donnie Coffin today.  The pt states she is performing all of her usual activities.  ECOG=0.   She reports that she is tolerating her study drugs "reasonably well".  She does report the following AE's as ongoing today:  hot flashes, memory impairment, intermittent nausea and diarrhea, arthritis in hands, bilateral cataracts, intermittent headache, and fatigue. The pt complained of some mild knee pain today.  The pt states that her knee pain is probably related to her arthritis and the "cooler weather".  The pt has a history of osteoarthritis.   Dr. Donnie Coffin reviewed the pt's CT scans with her.  The pt was told that she is still responding to her treatment.  She remains at a partial response.  Dr. Donnie Coffin also reviewed her lab values and her lab abnormals and stated that there were no clinically significant lab values to report.  The pt was dispensed her cycle 9 everolimus study drug kit for self administration.  The pt  was instructed to continue taking her everolimus 10 mg (2 tabs) daily with her letrozole 2.5 mg daily.  The pt is aware of her cycle 10 appointments in November.  The research nurse took the pt's disability forms to [employer] in care management to complete.  The pt had no questions and concerns about continuing her treatment.  The pt's lipid panel results are pending.   05/22/12 at 9:09am The pt's lipid panel results revealed that her cholesterol and triglycerides are stable.  The pt confirmed that she takes her Lopid "religiously".

## 2012-05-21 NOTE — Telephone Encounter (Signed)
Gave patient appointment for 06-18-2012 starting at 9:15am

## 2012-05-21 NOTE — Progress Notes (Signed)
Hematology and Oncology Follow Up Visit  ELLAROSE BRANDI [id number] [birth year] 61 y.o. 01/29/12    HPI: 61 year old [name] with newly diagnosed metastatic ER positive breast carcinoma with involvement of the right pleura and pericardium. She is participating in the [place] study, for which she is taking letrozole 2.5 mg per day and Afinitor 10 mg per day.  Has completed cycle 8. Has had mild-moderate elevation of ALT and mild-moderate elevation of trigylerides. She's also developed cataracts in may need to have a left fracture.  Interim History:   Mrs. Gavilanes is seen today with her research nurse for followup and tolerance check of letrozole 2.5 mg per day given in combination with Afinitor 10 mg per day per the BOLERO-4  study. She is due to initiate day 1 cycle 9 today.   In Gen. she's doing well. She has no respiratory complaints. ECoG performance status 0. As noted above she does need some cataract surgery. She is complaining of some knee pain. Aside from that she is doing well. Medications:   I have reviewed the patient's current medications.    Allergies:  Allergies  Allergen Reactions  . Aspirin     REACTION: upset stomach  . Latex Other (See Comments)    Blistering and skin peels off  . Nsaids Nausea And Vomiting    Extreme nausea and vomiting    Physical Exam:  Filed Vitals:   05/21/12 1049  BP: 118/83  Pulse:   Temp:   Resp:     Body mass index is 31.05 kg/(m^2). Weight: 191 lbs. HEENT:  Sclerae anicteric, conjunctivae pink.  Oropharynx clear.  No mucositis or candidiasis.   Nodes:  No cervica or supraclavicular lymphadenopathy. Breast Exam: Right chest wall exam unremarkable left breast, both axilla normal Lungs:  Clear to auscultation bilaterally, diminished in the right base. No crackles, rhonchi, or wheezes.   Heart:  Regular rate and rhythm, no murmurs rubs gallops or clicks appreciated. ABD: Soft, nontender without organomegaly. No  distention. EXT:  No cyanosis, or ecchymoses. No rash is evident. No evidence of pedal edema. NEURO: Alert and oriented x 3 without any focal deficits.  ECOG: 0  Lab Results: Lab Results  Component Value Date   WBC 6.6 05/21/2012   HGB 13.9 05/21/2012   HCT 40.9 05/21/2012   MCV 78.3* 05/21/2012   PLT 288 05/21/2012   NEUTROABS 4.7 05/21/2012     Chemistry      Component Value Date/Time   NA 139 05/21/2012 1018   NA 138 02/27/2012 1047   K 4.0 05/21/2012 1018   K 4.0 02/27/2012 1047   CL 109* 05/21/2012 1018   CL 105 02/27/2012 1047   CO2 19* 05/21/2012 1018   CO2 22 02/27/2012 1047   BUN 17.0 05/21/2012 1018   BUN 14 02/27/2012 1047   CREATININE 0.7 05/21/2012 1018   CREATININE 0.76 02/27/2012 1047      Component Value Date/Time   CALCIUM 10.6* 05/21/2012 1018   CALCIUM 10.0 02/27/2012 1047   ALKPHOS 164* 05/21/2012 1018   ALKPHOS 149* 02/27/2012 1047   AST 40* 05/21/2012 1018   AST 42* 02/27/2012 1047   ALT 45 05/21/2012 1018   ALT 42* 02/27/2012 1047   BILITOT 0.30 05/21/2012 1018   BILITOT 0.2* 02/27/2012 1047       Assessment: 61 year old [name] with  1. Metastatic ER positive breast carcinoma with involvement of the right pleura and pericardium. On Afinitor and Letrozole with good tolerance.  2. Arthralgias, bilateral hands.,  3. mild elevation in AST 4. Grade 2 elevation of trigylerides 5. CT scan performed 1022 shows ongoing response in right lower lobe nodule, subcarinal and right hilar adenopathy. PLAN:  1. Continue Lopid bid 2. Return 06/18/2012   Pierce Crane M.D. FRCP C.

## 2012-05-22 LAB — LIPID PANEL
LDL Cholesterol: 130 mg/dL — ABNORMAL HIGH (ref 0–99)
Total CHOL/HDL Ratio: 4.9 Ratio
VLDL: 51 mg/dL — ABNORMAL HIGH (ref 0–40)

## 2012-05-25 ENCOUNTER — Encounter: Payer: Self-pay | Admitting: Oncology

## 2012-05-25 NOTE — Progress Notes (Signed)
Put disability form on nurse's desk. °

## 2012-05-27 ENCOUNTER — Encounter: Payer: Self-pay | Admitting: Oncology

## 2012-05-27 NOTE — Progress Notes (Signed)
Mailed disability paper to [facility] @ [address] [place] [address].

## 2012-06-18 ENCOUNTER — Ambulatory Visit (HOSPITAL_BASED_OUTPATIENT_CLINIC_OR_DEPARTMENT_OTHER): Payer: Medicaid Other | Admitting: Oncology

## 2012-06-18 ENCOUNTER — Other Ambulatory Visit: Payer: Self-pay | Admitting: *Deleted

## 2012-06-18 ENCOUNTER — Encounter: Payer: Self-pay | Admitting: *Deleted

## 2012-06-18 ENCOUNTER — Other Ambulatory Visit (HOSPITAL_BASED_OUTPATIENT_CLINIC_OR_DEPARTMENT_OTHER): Payer: Medicaid Other | Admitting: Lab

## 2012-06-18 VITALS — BP 109/73 | HR 93 | Temp 97.6°F | Resp 20 | Ht 65.0 in | Wt 187.9 lb

## 2012-06-18 DIAGNOSIS — C782 Secondary malignant neoplasm of pleura: Secondary | ICD-10-CM

## 2012-06-18 DIAGNOSIS — C50919 Malignant neoplasm of unspecified site of unspecified female breast: Secondary | ICD-10-CM

## 2012-06-18 DIAGNOSIS — C779 Secondary and unspecified malignant neoplasm of lymph node, unspecified: Secondary | ICD-10-CM

## 2012-06-18 LAB — LIPID PANEL: LDL Cholesterol: 116 mg/dL — ABNORMAL HIGH (ref 0–99)

## 2012-06-18 LAB — COMPREHENSIVE METABOLIC PANEL (CC13)
Albumin: 3.4 g/dL — ABNORMAL LOW (ref 3.5–5.0)
Alkaline Phosphatase: 146 U/L (ref 40–150)
BUN: 19 mg/dL (ref 7.0–26.0)
Calcium: 10.2 mg/dL (ref 8.4–10.4)
Chloride: 111 mEq/L — ABNORMAL HIGH (ref 98–107)
Glucose: 126 mg/dl — ABNORMAL HIGH (ref 70–99)
Potassium: 4.1 mEq/L (ref 3.5–5.1)

## 2012-06-18 LAB — CBC WITH DIFFERENTIAL/PLATELET
Basophils Absolute: 0.1 10*3/uL (ref 0.0–0.1)
Eosinophils Absolute: 0.2 10*3/uL (ref 0.0–0.5)
HCT: 39.6 % (ref 34.8–46.6)
HGB: 13.5 g/dL (ref 11.6–15.9)
MCV: 78.1 fL — ABNORMAL LOW (ref 79.5–101.0)
MONO%: 12.8 % (ref 0.0–14.0)
NEUT#: 4.1 10*3/uL (ref 1.5–6.5)
RDW: 14.4 % (ref 11.2–14.5)

## 2012-06-18 LAB — BILIRUBIN, DIRECT: Bilirubin, Direct: <0.1 mg/dL (ref 0.0–0.3)

## 2012-06-18 LAB — PHOSPHORUS: Phosphorus: 3.3 mg/dL (ref 2.3–4.6)

## 2012-06-18 MED ORDER — METOPROLOL TARTRATE 25 MG PO TABS: 12.5000 mg | ORAL_TABLET | Freq: Every day | ORAL | Status: DC

## 2012-06-18 NOTE — Patient Instructions (Addendum)
Vitamin d3 2000 units per day

## 2012-06-18 NOTE — Progress Notes (Signed)
06/18/12 at 12:29pm - [employer]/Bolero 4 - Cycle 10, day 1 study notes- The pt was into the cancer center this morning for her cycle 10 assessments.  She confirmed that she was fasting overnight and this morning prior to her arrival for her labs.  She also was seated for 5 minutes before her vitals (BP x 3) were taken.  The pt's weight was obtained, and her weight is stable.  The pt was seen and examined this morning by Dr. Donnie Coffin.  He said that her lungs are clear, and he did not find any new abnormal findings.  He said he was pleased with her overall status, and he wanted her to continue on the study.  The pt was in agreement to continue taking her study drugs as prescribed.  She returned her cycle 9 everolimus drug kit with 14 remaining, unopened blister packs.   She confirmed that she has taken her everolimus (2 tabs) and letrozole (1 tab) daily from 05/21/12 through 06/17/12 (28 days x 2 tablets=56 pills).  The pt was dispensed 70 pills, and she returned 14 unopened blister packs of everolimus (70-14 = 56 tablets taken).  Therefore, the pt was 100% compliant with her study drug.  Dr. Donnie Coffin reviewed her labs, and he felt that her lab abnormals were not clinically significant.  He did advise the pt to start taking vitamin D3 as a supplement since her vitamin D was noted to be low last month.  The research nurse reviewed the pt's ongoing AE's and concomitant medications.  The pt stated she still has the following ongoing AE's: hot flashes, memory impairment, nausea, diarrhea, arthritis in hands, bilateral cataracts, and fatigue. She denies any ongoing headaches.  She denies any new adverse events.  The pt and the research nurse reviewed her [employer] concomitant medication log.  The pt confirmed that she is taking the following medications:  Lopressor, Prilosec, compazine, immodium, Lopid, and ibuprofen.  The pt said that she would go by the store, pick up her vitamin D,  and begin dosing today.  The research nurse  will add this new supplement on her concomitant medication log.  The pt states she went shopping with a friend for 7 hours last week. She is also working in her yard and performing all of her usual activities.  ECOG=0.  She said that if she "over exerts herself" then she usually feels bad the following day with nausea, diarrhea and fatigue.  The pt is aware that her next set of scans will be done before cycle 11.  The pt was dispensed her cycle 10 everolimus study drug kit for self administration.  The pt confirmed that she has enough letrozole for cycle 10.  The pt was instructed to continue her everolimus dosing (2 tabs) along with letrozole (1 tab) daily.     06/18/12 at 1:53pm- The pt's lipid panel results came back, and the nurse called the pt to let her know that her values are stable.  The pt was also instructed to avoid [place] oranges, star fruit, and grapefruit and their juices due to possible drug interactions.   The pt is aware of her CT scans scheduled for 07/14/12.

## 2012-06-18 NOTE — Progress Notes (Signed)
Hematology and Oncology Follow Up Visit  Barbara Parks [id number] 1951-05-26 61 y.o. 01/29/12    HPI: 61 year old [name] with newly diagnosed metastatic ER positive breast carcinoma with involvement of the right pleura and pericardium. She is participating in the [place] study, for which she is taking letrozole 2.5 mg per day and Afinitor 10 mg per day.  Has completed cycle 9. Has had mild-moderate elevation of ALT and mild-moderate elevation of trigylerides, she is compliant with her lopid. She's also developed cataracts in may need to have a left lens replacement..  Interim History:   Barbara Parks is seen today with her research nurse for followup and tolerance check of letrozole 2.5 mg per day given in combination with Afinitor 10 mg per day per the BOLERO-4  study. She is due to initiate day 1 cycle 10 today.   In Gen. she's doing well. She has no respiratory complaints. ECoG performance status 0. As noted above she does need some cataract surgery. She is complaining of some knee pain. Aside from that she is doing well. Medications:   I have reviewed the patient's current medications.    Allergies:  Allergies  Allergen Reactions  . Aspirin     REACTION: upset stomach  . Latex Other (See Comments)    Blistering and skin peels off  . Nsaids Nausea And Vomiting    Extreme nausea and vomiting    Physical Exam:  Filed Vitals:   06/18/12 1006  BP: 109/73  Pulse: 93  Temp:   Resp:     Body mass index is 31.27 kg/(m^2). Weight: 191 lbs. HEENT:  Sclerae anicteric, conjunctivae pink.  Oropharynx clear.  No mucositis or candidiasis.   Nodes:  No cervica or supraclavicular lymphadenopathy. Breast Exam: Right chest wall exam unremarkable left breast, both axilla normal Lungs:  Clear to auscultation bilaterally, diminished in the right base. No crackles, rhonchi, or wheezes.   Heart:  Regular rate and rhythm, no murmurs rubs gallops or clicks appreciated. ABD:  Soft, nontender without organomegaly. No distention. EXT:  No cyanosis, or ecchymoses. No rash is evident. No evidence of pedal edema. NEURO: Alert and oriented x 3 without any focal deficits.  ECOG: 0  Lab Results: Lab Results  Component Value Date   WBC 6.2 06/18/2012   HGB 13.5 06/18/2012   HCT 39.6 06/18/2012   MCV 78.1* 06/18/2012   PLT 293 06/18/2012   NEUTROABS 4.1 06/18/2012     Chemistry      Component Value Date/Time   NA 140 06/18/2012 0920   NA 138 02/27/2012 1047   K 4.1 06/18/2012 0920   K 4.0 02/27/2012 1047   CL 111* 06/18/2012 0920   CL 105 02/27/2012 1047   CO2 21* 06/18/2012 0920   CO2 22 02/27/2012 1047   BUN 19.0 06/18/2012 0920   BUN 14 02/27/2012 1047   CREATININE 0.8 06/18/2012 0920   CREATININE 0.76 02/27/2012 1047      Component Value Date/Time   CALCIUM 10.2 06/18/2012 0920   CALCIUM 10.0 02/27/2012 1047   ALKPHOS 146 06/18/2012 0920   ALKPHOS 149* 02/27/2012 1047   AST 31 06/18/2012 0920   AST 42* 02/27/2012 1047   ALT 39 06/18/2012 0920   ALT 42* 02/27/2012 1047   BILITOT 0.29 06/18/2012 0920   BILITOT 0.2* 02/27/2012 1047       Assessment: 61 year old [name] with  1. Metastatic ER positive breast carcinoma with involvement of the right pleura and pericardium.  On Afinitor and Letrozole with good tolerance.  She will return in  46month and have scans bewfore she comes back.    Pierce Crane M.D. FRCP C.

## 2012-06-19 MED ORDER — INV-EVEROLIMUS (RAD0001) 5MG TABLET NOVARTIS CRAD001Y24135: 2.0000 | ORAL_TABLET | Freq: Every day | ORAL | Status: DC

## 2012-06-30 MED ORDER — GEMFIBROZIL 600 MG PO TABS: 600.0000 mg | ORAL_TABLET | Freq: Two times a day (BID) | ORAL | Status: DC

## 2012-06-30 NOTE — Addendum Note (Signed)
Addended by: Pierce Crane on: 06/30/2012 02:59 PM   Modules accepted: Orders

## 2012-07-09 ENCOUNTER — Telehealth: Payer: Self-pay | Admitting: Oncology

## 2012-07-09 NOTE — Telephone Encounter (Signed)
Error

## 2012-07-14 ENCOUNTER — Ambulatory Visit (HOSPITAL_COMMUNITY)
Admission: RE | Admit: 2012-07-14 | Discharge: 2012-07-14 | Disposition: A | Discharge status: Home / Self Care | Payer: Self-pay | Source: Ambulatory Visit | Attending: Oncology | Admitting: Oncology

## 2012-07-14 DIAGNOSIS — Z79899 Other long term (current) drug therapy: Secondary | ICD-10-CM | POA: Insufficient documentation

## 2012-07-14 DIAGNOSIS — M949 Disorder of cartilage, unspecified: Secondary | ICD-10-CM | POA: Insufficient documentation

## 2012-07-14 DIAGNOSIS — M169 Osteoarthritis of hip, unspecified: Secondary | ICD-10-CM | POA: Insufficient documentation

## 2012-07-14 DIAGNOSIS — M899 Disorder of bone, unspecified: Secondary | ICD-10-CM | POA: Insufficient documentation

## 2012-07-14 DIAGNOSIS — K5730 Diverticulosis of large intestine without perforation or abscess without bleeding: Secondary | ICD-10-CM | POA: Insufficient documentation

## 2012-07-14 DIAGNOSIS — M1610 Unilateral primary osteoarthritis, unspecified hip: Secondary | ICD-10-CM | POA: Insufficient documentation

## 2012-07-14 DIAGNOSIS — J984 Other disorders of lung: Secondary | ICD-10-CM | POA: Insufficient documentation

## 2012-07-14 DIAGNOSIS — I517 Cardiomegaly: Secondary | ICD-10-CM | POA: Insufficient documentation

## 2012-07-14 DIAGNOSIS — J438 Other emphysema: Secondary | ICD-10-CM | POA: Insufficient documentation

## 2012-07-14 DIAGNOSIS — C7800 Secondary malignant neoplasm of unspecified lung: Secondary | ICD-10-CM | POA: Insufficient documentation

## 2012-07-14 DIAGNOSIS — C50919 Malignant neoplasm of unspecified site of unspecified female breast: Secondary | ICD-10-CM | POA: Insufficient documentation

## 2012-07-14 DIAGNOSIS — K7689 Other specified diseases of liver: Secondary | ICD-10-CM | POA: Insufficient documentation

## 2012-07-14 DIAGNOSIS — Z9010 Acquired absence of unspecified breast and nipple: Secondary | ICD-10-CM | POA: Insufficient documentation

## 2012-07-14 MED ORDER — IOHEXOL 300 MG/ML  SOLN
100.0000 mL | Freq: Once | INTRAMUSCULAR | Status: AC | PRN
  Administered 2012-07-14: 100 mL via INTRAVENOUS

## 2012-07-16 ENCOUNTER — Other Ambulatory Visit (HOSPITAL_BASED_OUTPATIENT_CLINIC_OR_DEPARTMENT_OTHER): Payer: Self-pay | Admitting: Lab

## 2012-07-16 ENCOUNTER — Ambulatory Visit (HOSPITAL_BASED_OUTPATIENT_CLINIC_OR_DEPARTMENT_OTHER): Payer: Self-pay | Admitting: Oncology

## 2012-07-16 ENCOUNTER — Encounter: Payer: Self-pay | Admitting: *Deleted

## 2012-07-16 VITALS — BP 123/82 | HR 92 | Temp 97.9°F | Resp 20 | Ht 65.0 in | Wt 187.2 lb

## 2012-07-16 DIAGNOSIS — C50919 Malignant neoplasm of unspecified site of unspecified female breast: Secondary | ICD-10-CM

## 2012-07-16 DIAGNOSIS — C50419 Malignant neoplasm of upper-outer quadrant of unspecified female breast: Secondary | ICD-10-CM

## 2012-07-16 DIAGNOSIS — C779 Secondary and unspecified malignant neoplasm of lymph node, unspecified: Secondary | ICD-10-CM

## 2012-07-16 DIAGNOSIS — C782 Secondary malignant neoplasm of pleura: Secondary | ICD-10-CM

## 2012-07-16 DIAGNOSIS — J90 Pleural effusion, not elsewhere classified: Secondary | ICD-10-CM

## 2012-07-16 LAB — COMPREHENSIVE METABOLIC PANEL
ALT: 33 U/L (ref 0–35)
AST: 30 U/L (ref 0–37)
Albumin: 3.7 g/dL (ref 3.5–5.2)
Alkaline Phosphatase: 142 U/L — ABNORMAL HIGH (ref 39–117)
Potassium: 4 mEq/L (ref 3.5–5.3)
Sodium: 136 mEq/L (ref 135–145)
Total Bilirubin: 0.2 mg/dL — ABNORMAL LOW (ref 0.3–1.2)
Total Protein: 8 g/dL (ref 6.0–8.3)

## 2012-07-16 LAB — BILIRUBIN, DIRECT: Bilirubin, Direct: <0.1 mg/dL (ref 0.0–0.3)

## 2012-07-16 LAB — CBC WITH DIFFERENTIAL/PLATELET
Basophils Absolute: 0.1 10*3/uL (ref 0.0–0.1)
HCT: 41.1 % (ref 34.8–46.6)
HGB: 14.1 g/dL (ref 11.6–15.9)
MONO#: 0.9 10*3/uL (ref 0.1–0.9)
NEUT#: 3.7 10*3/uL (ref 1.5–6.5)
NEUT%: 61.8 % (ref 38.4–76.8)
WBC: 5.9 10*3/uL (ref 3.9–10.3)
lymph#: 1 10*3/uL (ref 0.9–3.3)

## 2012-07-16 LAB — LIPID PANEL
HDL: 50 mg/dL (ref >39)
LDL Cholesterol: 140 mg/dL — ABNORMAL HIGH (ref 0–99)
Total CHOL/HDL Ratio: 4.8 Ratio

## 2012-07-16 MED ORDER — INV-EVEROLIMUS (RAD0001) 5MG TABLET NOVARTIS CRAD001Y24135: 2.0000 | ORAL_TABLET | Freq: Every day | ORAL | Status: DC

## 2012-07-16 NOTE — Progress Notes (Signed)
07/16/12 at 11:49am - [employer] 4 - cycle 11, day 1 study notes.  The pt was into the cancer center this am for her cycle 11 assessments.  She confimed that she has been fasting since 9pm on 07/15/12.  She had her study required labs drawn today.  The pt returned her cycle 10 everolimus study drug kit.  She returned 14 unopened blister packs.  She confirmed that she took her pills (2 pills) everyday from 06/18/12 through 07/15/12 (28 days).  She is 100% compliant in her daily dosing of everolimus (70 dispensed - 56 doses = 14 pills).  The pt also confirmed that she has taken her letrozole 2.5 mg pill everyday along with her everolimus. The pt states she is tolerating her study drugs well.  She reports the following AE's as ongoing:  memory impairment, hot flashes, intermittent nausea and diarrhea, bilateral cataracts, mild fatigue, and arthritis in her hands.  She states she has developed some new mild sinus congestion symptoms (sinus pain/pressure between eyes and post-nasal drip).  Dr. Donnie Coffin does not feel that her sinus problems are related to her study treatment.  The pt said that she may need a humidifier for better air quaility.  The pt was seen and examined today by Dr. Donnie Coffin.  He reviewed her scans from Tuesday 07/14/12.  He states that he does not see any definitive signs of progressive disease.  He states that she is stable in his opinion, and she should continue on with her study drugs with no adjustments.  She denies any worsening respiratory issues.  The pt is able to do all of her normal activities, but she states she has to rest and "pace herself".  ECOG=0.  The pt was informed that Dr. Donnie Coffin will no longer be her physician as of 07/28/12.  She will be assigned a new physician at the [facility].  The pt verbalized understanding.  The research nurse also reviewed her current concomitant medications.  The pt reports the following medications as ongoing:  Lopressor, Prilosec, compazine, immodium, Lopid, ibuprofen,  and vitamin D.  The pt's next appt for cycle 12 will be due around 08/13/12.  The research nurse will ensure that the pt is assigned a new physician.  Her labs were reviewed by Dr. Donnie Coffin and no abnormal labs were clinically significant requiring intervention.

## 2012-07-16 NOTE — Progress Notes (Signed)
Hematology and Oncology Follow Up Visit  ARNETIA BRONK [id number] 03-22-1951 61 y.o. 01/29/12    HPI: 61 year old [name] with newly diagnosed metastatic ER positive breast carcinoma with involvement of the right pleura and pericardium. She is participating in the [place] study, for which she is taking letrozole 2.5 mg per day and Afinitor 10 mg per day.  Has completed cycle 10 . Has had mild-moderate elevation of ALT and mild-moderate elevation of trigylerides, she is compliant with her lopid. She's also developed cataracts in may need to have a left lens replacement..  Interim History:   Mrs. Gatti is seen today with her research nurse for followup and tolerance check of letrozole 2.5 mg per day given in combination with Afinitor 10 mg per day per the BOLERO-4  study. She is due to initiate day 1 cycle 11 today.   In Gen. she's doing well. She has no respiratory complaints, apart from no productive cough. She has some mild soboe, which she relates to cold. No mouth sores.  ECoG performance status 0. As noted above she does need some cataract surgery. She is complaining of some knee pain. Aside from that she is doing well. Medications:   I have reviewed the patient's current medications.    Allergies:  Allergies  Allergen Reactions  . Aspirin     REACTION: upset stomach  . Latex Other (See Comments)    Blistering and skin peels off  . Nsaids Nausea And Vomiting    Extreme nausea and vomiting    Physical Exam:  Filed Vitals:   07/16/12 0956  BP: 123/82  Pulse:   Temp:   Resp:     Body mass index is 31.15 kg/(m^2). Weight: 191 lbs. HEENT:  Sclerae anicteric, conjunctivae pink.  Oropharynx clear.  No mucositis or candidiasis.   Nodes:  No cervica or supraclavicular lymphadenopathy. Breast Exam: Right chest wall exam unremarkable left breast, both axilla normal Lungs:  Clear to auscultation bilaterally, diminished in the right base. No crackles, rhonchi, or  wheezes.   Heart:  Regular rate and rhythm, no murmurs rubs gallops or clicks appreciated. ABD: Soft, nontender without organomegaly. No distention. EXT:  No cyanosis, or ecchymoses. No rash is evident. No evidence of pedal edema. NEURO: Alert and oriented x 3 without any focal deficits.  ECOG: 0  Lab Results: Lab Results  Component Value Date   WBC 5.9 07/16/2012   HGB 14.1 07/16/2012   HCT 41.1 07/16/2012   MCV 78.2* 07/16/2012   PLT 260 07/16/2012   NEUTROABS 3.7 07/16/2012     Chemistry      Component Value Date/Time   NA 136 07/16/2012 0910   NA 140 06/18/2012 0920   K 4.0 07/16/2012 0910   K 4.1 06/18/2012 0920   CL 103 07/16/2012 0910   CL 111* 06/18/2012 0920   CO2 21 07/16/2012 0910   CO2 21* 06/18/2012 0920   BUN 16 07/16/2012 0910   BUN 19.0 06/18/2012 0920   CREATININE 0.75 07/16/2012 0910   CREATININE 0.8 06/18/2012 0920      Component Value Date/Time   CALCIUM 10.1 07/16/2012 0910   CALCIUM 10.2 06/18/2012 0920   ALKPHOS 142* 07/16/2012 0910   ALKPHOS 146 06/18/2012 0920   AST 30 07/16/2012 0910   AST 31 06/18/2012 0920   ALT 33 07/16/2012 0910   ALT 39 06/18/2012 0920   BILITOT 0.2* 07/16/2012 0910   BILITOT 0.29 06/18/2012 0920     SCANS Target Lesions:  1. right lower lobe lung nodule - 6 mm (image 40/series 5)  unchanged.  2. Subcarinal nodal tissue at 9 mm on image 30/series 2. 8 mm on  the prior.  3. Right hilar nodal tissue at 8 mm on image 28/series 2. 6 mm on  the prior.  Non Target lesions:  1. right-sided pleural effusion - trace and similar.  2. Superior segment right lower lobe subpleural nodularity.  Similar on image 30/series 5.     Assessment: 61 year old [name] with  1. Metastatic ER positive breast carcinoma with involvement of the right pleura and pericardium. On Afinitor and Letrozole with good tolerance. There is an insignificant amount of progression , possibl, on the recent scan. This is not  sufficient to change treatment. She will return in  95month and have scans in 2 months co mes back.    Pierce Crane M.D. FRCP C.

## 2012-07-18 ENCOUNTER — Telehealth: Payer: Self-pay | Admitting: *Deleted

## 2012-07-18 NOTE — Telephone Encounter (Signed)
Left message for pt to return my call on Monday about her upcoming appt. 

## 2012-07-24 ENCOUNTER — Telehealth: Payer: Self-pay | Admitting: *Deleted

## 2012-07-24 ENCOUNTER — Telehealth: Payer: Self-pay | Admitting: Oncology

## 2012-07-24 NOTE — Telephone Encounter (Signed)
Barbara Parks called stating that this pt is a fasting lab study pt and need to be seen in the am.  Gave 8am appt to [facility] for same day.

## 2012-07-24 NOTE — Telephone Encounter (Signed)
I left a message to give her a new appt with Dr. Welton Flakes.

## 2012-08-10 ENCOUNTER — Telehealth: Payer: Self-pay | Admitting: Emergency Medicine

## 2012-08-10 ENCOUNTER — Other Ambulatory Visit: Payer: Self-pay | Admitting: *Deleted

## 2012-08-10 DIAGNOSIS — C50919 Malignant neoplasm of unspecified site of unspecified female breast: Secondary | ICD-10-CM

## 2012-08-10 NOTE — Telephone Encounter (Signed)
Left message on voicemail with new appointment date and times for 1/17 at 8:00 and 8:30 for labs and Dr Welton Flakes.

## 2012-08-14 ENCOUNTER — Telehealth: Payer: Self-pay | Admitting: *Deleted

## 2012-08-14 ENCOUNTER — Encounter: Payer: Self-pay | Admitting: Oncology

## 2012-08-14 ENCOUNTER — Ambulatory Visit: Payer: Self-pay | Admitting: Oncology

## 2012-08-14 ENCOUNTER — Other Ambulatory Visit: Payer: Self-pay | Admitting: Lab

## 2012-08-14 ENCOUNTER — Other Ambulatory Visit (HOSPITAL_BASED_OUTPATIENT_CLINIC_OR_DEPARTMENT_OTHER): Payer: Medicaid Other | Admitting: Lab

## 2012-08-14 ENCOUNTER — Ambulatory Visit (HOSPITAL_BASED_OUTPATIENT_CLINIC_OR_DEPARTMENT_OTHER): Payer: Medicaid Other | Admitting: Oncology

## 2012-08-14 ENCOUNTER — Encounter: Payer: Self-pay | Admitting: *Deleted

## 2012-08-14 VITALS — BP 118/79 | HR 97 | Temp 97.7°F | Resp 20 | Ht 65.0 in | Wt 187.0 lb

## 2012-08-14 DIAGNOSIS — J910 Malignant pleural effusion: Secondary | ICD-10-CM

## 2012-08-14 DIAGNOSIS — C7800 Secondary malignant neoplasm of unspecified lung: Secondary | ICD-10-CM

## 2012-08-14 DIAGNOSIS — I313 Pericardial effusion (noninflammatory): Secondary | ICD-10-CM

## 2012-08-14 DIAGNOSIS — C50419 Malignant neoplasm of upper-outer quadrant of unspecified female breast: Secondary | ICD-10-CM

## 2012-08-14 DIAGNOSIS — C782 Secondary malignant neoplasm of pleura: Secondary | ICD-10-CM

## 2012-08-14 DIAGNOSIS — C50919 Malignant neoplasm of unspecified site of unspecified female breast: Secondary | ICD-10-CM

## 2012-08-14 DIAGNOSIS — J90 Pleural effusion, not elsewhere classified: Secondary | ICD-10-CM

## 2012-08-14 LAB — COMPREHENSIVE METABOLIC PANEL (CC13)
ALT: 38 U/L (ref 0–55)
AST: 37 U/L — ABNORMAL HIGH (ref 5–34)
Albumin: 3.5 g/dL (ref 3.5–5.0)
Alkaline Phosphatase: 155 U/L — ABNORMAL HIGH (ref 40–150)
Glucose: 134 mg/dl — ABNORMAL HIGH (ref 70–99)
Potassium: 4.2 mEq/L (ref 3.5–5.1)
Sodium: 139 mEq/L (ref 136–145)
Total Protein: 8.5 g/dL — ABNORMAL HIGH (ref 6.4–8.3)

## 2012-08-14 LAB — CBC WITH DIFFERENTIAL/PLATELET
BASO%: 1.1 % (ref 0.0–2.0)
Basophils Absolute: 0.1 10*3/uL (ref 0.0–0.1)
HCT: 42.8 % (ref 34.8–46.6)
LYMPH%: 16 % (ref 14.0–49.7)
MCH: 26.1 pg (ref 25.1–34.0)
MCHC: 33.6 g/dL (ref 31.5–36.0)
MONO#: 0.9 10*3/uL (ref 0.1–0.9)
NEUT%: 66.9 % (ref 38.4–76.8)
Platelets: 270 10*3/uL (ref 145–400)

## 2012-08-14 LAB — LIPID PANEL
HDL: 50 mg/dL (ref >39)
LDL Cholesterol: 107 mg/dL — ABNORMAL HIGH (ref 0–99)
VLDL: 68 mg/dL — ABNORMAL HIGH (ref 0–40)

## 2012-08-14 LAB — BILIRUBIN, DIRECT: Bilirubin, Direct: <0.1 mg/dL (ref 0.0–0.3)

## 2012-08-14 MED ORDER — INV-EVEROLIMUS (RAD0001) 5MG TABLET NOVARTIS CRAD001Y24135: 2.0000 | ORAL_TABLET | Freq: Every day | ORAL | Status: DC

## 2012-08-14 NOTE — Telephone Encounter (Signed)
Printed out calendar and informed the patient that the date and time is 09-11-2012 starting at 9:00am

## 2012-08-14 NOTE — Patient Instructions (Addendum)
Continue cycle 12 of letrozole/affinitor   MRI of brain for headaches  CT scans on 09/08/12 for evauation of response to therapy  I will see you back on 09/10/12

## 2012-08-14 NOTE — Progress Notes (Signed)
08/14/12 at 9:36am - [employer], Bolero 4 cycle 12, day 1 study notes- The pt was into the cancer center this am for her cycle 12 assessments.  She confirmed that she has been fasting overnight and this am for her study required labs.  She returned her cycle 11 study drug kit.  She confirmed that she took 2 pills of everolimus from 07/16/12 through 08/13/12 (29 days x 2 doses = 58 doses).  The pt returned 12 opened blister packs of everolimus and 58 blister packs were opened.  Therefore, the pt has been 100% compliant in her everolimus dosing (70-58 =12).  The pt also confirmed that she has taken her letrozole 2.5 mg pill everyday along with her everolimus.  The pt's vitals were taken after the pt was seated for 5 minutes today (3 sets of BP and pulse were obtained).  The pt's weight is stable, and she reports a good appetite.  The pt states she is "moderately" fatigue lately (grade 2).  She states she is still able to run her errands and cook.  ECOG=1.  She also reports the following AE's as ongoing:  hot flashes, memory impairment, intermittent nausea and diarrhea, arthritis in her hands, bilateral cataracts, sinus pain, and post-nasal drip.  She states she developed a "sharp, shocking pain in her head" on Sunday, 08/09/12 (headache-grade 2).  Dr. Welton Flakes was made aware of this new pain, and the MD ordered a MRI of the brain for follow up.  The pt also reports some stable hip pain (reported at baseline).  The nurse reviewed the pt's concomitant medications log with the pt.  The pt states she is no longer taking her Prilosec anymore.  She reports the following medications as ongoing:  lopressor, compazine, immodium, lopid, ibuprofen, and vitamin D3.  The pt was seen and examined today by Dr. Welton Flakes for the first time since Dr. Donnie Coffin is no longer at the [facility] as of 07/28/12.  The pt's labs were reviewed by Dr. Welton Flakes and no significant clinical labs were noted that required intervention.  The MD was in agreement with continuing  the pt's current treatment plan with no modifications.  The pt was dispensed her new cycle 12 everolimus study drug kit for self administration.  The pt is aware of her cycle 13 appointments.    08/19/12 at 2:00pm - The pt was notified today of her fasting lipid panel results.  The pt's cholesterol value is stable, but her triglycerides have increased from 240 to 341.  The pt said she would watch her diet closely.  The pt confirmed that she is taking her Lopid daily.  The pt also reported to the research nurse that her brain MRI will be done on 08/21/12.   The pt was encouraged to call Dr. Welton Flakes if she develops any problems.  The pt verbalized understanding.

## 2012-08-18 ENCOUNTER — Other Ambulatory Visit: Payer: Self-pay | Admitting: *Deleted

## 2012-08-18 DIAGNOSIS — C50919 Malignant neoplasm of unspecified site of unspecified female breast: Secondary | ICD-10-CM

## 2012-08-19 ENCOUNTER — Encounter: Payer: Self-pay | Admitting: Oncology

## 2012-08-19 NOTE — Progress Notes (Signed)
Left message to call me back. I need to verify if she has medicaid. The last card she showed had 02/2012 date. Darlena verified none since Oct 2013? We need to know what type of insurance does she have??? If not possible self pay.

## 2012-08-20 ENCOUNTER — Encounter: Payer: Self-pay | Admitting: Oncology

## 2012-08-21 ENCOUNTER — Ambulatory Visit (HOSPITAL_COMMUNITY)
Admission: RE | Admit: 2012-08-21 | Discharge: 2012-08-21 | Disposition: A | Discharge status: Home / Self Care | Payer: Medicaid Other | Source: Ambulatory Visit | Attending: Oncology | Admitting: Oncology

## 2012-08-21 ENCOUNTER — Other Ambulatory Visit: Payer: Self-pay | Admitting: Oncology

## 2012-08-21 DIAGNOSIS — R51 Headache: Secondary | ICD-10-CM | POA: Insufficient documentation

## 2012-08-21 DIAGNOSIS — J90 Pleural effusion, not elsewhere classified: Secondary | ICD-10-CM

## 2012-08-21 DIAGNOSIS — I313 Pericardial effusion (noninflammatory): Secondary | ICD-10-CM

## 2012-08-21 DIAGNOSIS — C50919 Malignant neoplasm of unspecified site of unspecified female breast: Secondary | ICD-10-CM

## 2012-08-21 DIAGNOSIS — Q283 Other malformations of cerebral vessels: Secondary | ICD-10-CM | POA: Insufficient documentation

## 2012-08-21 MED ORDER — GADOBENATE DIMEGLUMINE 529 MG/ML IV SOLN
17.0000 mL | Freq: Once | INTRAVENOUS | Status: AC | PRN
  Administered 2012-08-21: 17 mL via INTRAVENOUS

## 2012-08-23 NOTE — Progress Notes (Signed)
OFFICE PROGRESS NOTE  CC  [name], MD [address] [place] [address]  DIAGNOSIS: 62 year old female with metastatic breast cancer with bone metastasis and lung metastasis.  PRIOR THERAPY:  #1 patient originally presented when she was 62 years old with early stage breast cancer in 2005. At that time she underwent a lumpectomy with axillary lymph node dissection. She was followed by Dr. Pierce Crane and Theron Arista young. Her last visit to Dr. Renelda Loma office was in 2006. Because of difficulties with insurance she had not been receiving mammograms.  #2 in 2012 patient began having shortness of breath on exertion. She was seen by pulmonary service and a CT scan showed some minor mediastinal adenopathy as well as borderline lower lobe nodule. PET scan was not performed. October 2012 patient began to experience more shortness of breath. Initially she was told that she had pneumonia. However subsequently she developed lower extremity edema. CT of the chest was obtained that showed a large pleural effusion in the right side as well as pericardial effusion. She was seen by cardiothoracic surgery placed in 09/11/2011. The cytology revealed that patient indeed have recurrent breast cancer that was hormone receptor positive HER-2/neu was not obtained as it was insufficient tissue. She subsequently had a CT scan performed that showed 2 nodules in the lung suspicious for metastatic disease as well as left lower lobe collapse. She had a biopsy of the lungs performed on 09/11/2011 the tumor was estrogen receptor +91% progesterone receptor +100% proliferation marker Ki-67 35%.  #3 patient was seen by Dr. Pierce Crane and he recommended that she be enrolled on the BOLERO-4 clinical trial utilizing letrozole 2.5 mg per day and affinitor10 mg per day. She initiated this on 10/10/2011.she has had a significant response to this lower extremity edema has reduced significantly in size she is no longer short  of breath.  CURRENT THERAPY:BOLERO -4 with everolimus 10 mg and letrozole 2.5 mg daily. Patient will begin cycle 12 day 1 today. ( 28 day cycles)  INTERVAL HISTORY: Barbara Parks 62 y.o. female returns for followup visit prior to cycle 12. Overall she is doing well she is without any significant complaints she denies any fevers chills night sweats headaches shortness of breath chest pains palpitations no myalgias and arthralgias. She has not had any nausea or vomiting no dominant pain she'll patient does get sores in her mouth. Remainder of the 10 point review of systems is negative care  MEDICAL HISTORY: Past Medical History  Diagnosis Date  . Asthma   . Shortness of breath   . Headache     OTC Prilosec  . Arthritis     left hip  . Contact dermatitis     from a bandaid that had Latex   . breast ca 2005    breast/chemo R mastectomy  . Metastasis to lung dx'd 08/2011    ALLERGIES:  is allergic to aspirin; latex; and nsaids.  MEDICATIONS:  Current Outpatient Prescriptions  Medication Sig Dispense Refill  . gemfibrozil (LOPID) 600 MG tablet Take 1 tablet (600 mg total) by mouth 2 (two) times daily before a meal.  90 tablet  3  . ibuprofen (ADVIL,MOTRIN) 200 MG tablet Take 400 mg by mouth every 6 (six) hours as needed. For pain      . Investigational everolimus (RAD001) 5 MG tablet [employer] [id number] Take 2 tablets by mouth daily. Take with a glass of water.      Marland Kitchen letrozole (FEMARA) 2.5 MG tablet Take 1  tablet (2.5 mg total) by mouth daily.  30 tablet  0  . metoprolol tartrate (LOPRESSOR) 25 MG tablet Take 0.5 tablets (12.5 mg total) by mouth daily.  30 tablet  4  . prochlorperazine (COMPAZINE) 10 MG tablet Take 10 mg by mouth every 6 (six) hours as needed. For nausea      . Investigational everolimus (RAD001) 5 MG tablet [employer] [id number] Take 2 tablets by mouth daily. Take with a glass of water.  70 tablet  0    SURGICAL HISTORY:  Past Surgical History  Procedure Date    . Spine surgery 1996  . Breast surgery 2005    right  . Video bronchoscopy 09/11/2011    Procedure: VIDEO BRONCHOSCOPY;  Surgeon: Alleen Borne, MD;  Location: [facility] OR;  Service: Thoracic;  Laterality: N/A;  . Chest tube insertion 09/11/2011    Procedure: INSERTION PLEURAL DRAINAGE CATHETER;  Surgeon: Alleen Borne, MD;  Location: [facility] OR;  Service: Thoracic;  Laterality: Right;  . Pericardial window 09/11/2011    Procedure: PERICARDIAL WINDOW;  Surgeon: Alleen Borne, MD;  Location: [facility] OR;  Service: Thoracic;  Laterality: N/A;  . Removal of pleural drainage catheter 12/19/2011    Procedure: REMOVAL OF PLEURAL DRAINAGE CATHETER;  Surgeon: Alleen Borne, MD;  Location: [facility] OR;  Service: Thoracic;  Laterality: Right;  TO BE DONE IN MINOR ROOM, SHORT STAY    REVIEW OF SYSTEMS:  Pertinent items are noted in HPI.   HEALTH MAINTENANCE:  PHYSICAL EXAMINATION: Blood pressure 118/79, pulse 97, temperature 97.7 F (36.5 C), resp. rate 20, height 5\' 5"  (1.651 m), weight 187 lb (84.823 kg). Body mass index is 31.12 kg/(m^2). ECOG PERFORMANCE STATUS: 1 - Symptomatic but completely ambulatory   General appearance: alert, cooperative and appears stated age Lymph nodes: Cervical, supraclavicular, and axillary nodes normal. Resp: clear to auscultation bilaterally Back: symmetric, no curvature. ROM normal. No CVA tenderness. Cardio: regular rate and rhythm GI: soft, non-tender; bowel sounds normal; no masses,  no organomegaly Extremities: extremities normal, atraumatic, no cyanosis or edema Neurologic: Grossly normal   LABORATORY DATA: Lab Results  Component Value Date   WBC 6.7 08/14/2012   HGB 14.4 08/14/2012   HCT 42.8 08/14/2012   MCV 77.7* 08/14/2012   PLT 270 08/14/2012      Chemistry      Component Value Date/Time   NA 139 08/14/2012 0803   NA 136 07/16/2012 0910   K 4.2 08/14/2012 0803   K 4.0 07/16/2012 0910   CL 108* 08/14/2012 0803   CL 103 07/16/2012 0910   CO2 19* 08/14/2012 0803    CO2 21 07/16/2012 0910   BUN 19.0 08/14/2012 0803   BUN 16 07/16/2012 0910   CREATININE 0.9 08/14/2012 0803   CREATININE 0.75 07/16/2012 0910      Component Value Date/Time   CALCIUM 10.4 08/14/2012 0803   CALCIUM 10.1 07/16/2012 0910   ALKPHOS 155* 08/14/2012 0803   ALKPHOS 142* 07/16/2012 0910   AST 37* 08/14/2012 0803   AST 30 07/16/2012 0910   ALT 38 08/14/2012 0803   ALT 33 07/16/2012 0910   BILITOT 0.31 08/14/2012 0803   BILITOT 0.2* 07/16/2012 0910       RADIOGRAPHIC STUDIES:  Mr Laqueta Jean HY Contrast  08/21/2012  *RADIOLOGY REPORT*  Clinical Data: Stage IV breast cancer.  Headaches.  MRI HEAD WITHOUT AND WITH CONTRAST  Technique:  Multiplanar, multiecho pulse sequences of the brain and surrounding structures were obtained according to standard protocol  without and with intravenous contrast  Contrast: 17mL MULTIHANCE GADOBENATE DIMEGLUMINE 529 MG/ML IV SOLN  Comparison: MRI 10/03/2011  Findings: Previously identified areas of  enhancement in the left cerebellum have resolved.  This may be due to treated metastatic disease.  No enhancing metastatic deposits are seen on today's study.  Developmental venous anomaly left parietal lobe over the convexity is unchanged.  Ventricle size is normal.  No mass effect or edema is present in the brain.  Cerebellar tonsils remain slightly low lying without definite Chiari malformation.  Paranasal sinuses show mild mucosal thickening.  IMPRESSION: Enhancing lesions  left cerebellum have resolved and may be treated metastatic disease.  Currently no metastatic deposits are identified in the brain.  Developmental venous anomaly left frontal lobe, unchanged.   Original Report Authenticated By: Janeece Riggers, M.D.     ASSESSMENT: 62 year old female with  #1 original diagnosis of breast cancer in 2005. She was treated with a lumpectomy followed by radiation. She apparently did go on antiestrogen therapy. Thereafter she was lost to follow up due to lack of  finances and loss of insurance.    2 patient was seen by Dr. Donnie Coffin in March 2013 with metastatic recurrent breast cancer that was ER positive PR positive with lung metastasis malignant pleural effusion and malignant pericardial effusion.she underwent pericardial window as well as Pleurx catheter placement.  #3 she was then begun on BOLERO 4 clinical trial consisting of everolimus 10 mg and letrozole 2.5 mg given on a daily basis on a 28 day cycle. So far she has completed 11 cycles. She is here for cycle number 12.   PLAN:   #1 patient will proceed with cycle 12 of her chemotherapy consisting of everolimus and letrozole.  #2 she will have restaging scans performed on 09/09/2011 consisting of CT of the chest abdomen and pelvis prior to start of cycle 3 of her treatment.   All questions were answered. The patient knows to call the clinic with any problems, questions or concerns. We can certainly see the patient much sooner if necessary.  I spent 40 minutes counseling the patient face to face. The total time spent in the appointment was 60 minutes.    Drue Second, MD Medical/Oncology [facility] [phone] (beeper) [phone] (Office)  08/23/2012, 2:07 PM

## 2012-08-26 NOTE — Progress Notes (Signed)
[facility] today.  Letter mailed to patient.  

## 2012-08-28 ENCOUNTER — Encounter: Payer: Self-pay | Admitting: Oncology

## 2012-08-28 NOTE — Progress Notes (Signed)
Called patient and left message. I need to know if she has insurance and if not he needs to do a new application per medicaid.Or if she has gotten any new insurance when she comes we need that new information.

## 2012-08-28 NOTE — Progress Notes (Signed)
Called [employer] Social services to verify if patient has medicaid. Brook Dulin called me back to advise this patient has been inactive since 10/30/2011 and she will need to apply again. I will make sure the patient is aware.

## 2012-09-08 ENCOUNTER — Ambulatory Visit (HOSPITAL_COMMUNITY)
Admission: RE | Admit: 2012-09-08 | Discharge: 2012-09-08 | Disposition: A | Discharge status: Home / Self Care | Payer: Self-pay | Source: Ambulatory Visit | Attending: Oncology | Admitting: Oncology

## 2012-09-08 ENCOUNTER — Encounter (HOSPITAL_COMMUNITY): Payer: Self-pay

## 2012-09-08 DIAGNOSIS — I313 Pericardial effusion (noninflammatory): Secondary | ICD-10-CM

## 2012-09-08 DIAGNOSIS — M899 Disorder of bone, unspecified: Secondary | ICD-10-CM | POA: Insufficient documentation

## 2012-09-08 DIAGNOSIS — K7689 Other specified diseases of liver: Secondary | ICD-10-CM | POA: Insufficient documentation

## 2012-09-08 DIAGNOSIS — Z79899 Other long term (current) drug therapy: Secondary | ICD-10-CM | POA: Insufficient documentation

## 2012-09-08 DIAGNOSIS — C50919 Malignant neoplasm of unspecified site of unspecified female breast: Secondary | ICD-10-CM | POA: Insufficient documentation

## 2012-09-08 DIAGNOSIS — I700 Atherosclerosis of aorta: Secondary | ICD-10-CM | POA: Insufficient documentation

## 2012-09-08 DIAGNOSIS — Z9010 Acquired absence of unspecified breast and nipple: Secondary | ICD-10-CM | POA: Insufficient documentation

## 2012-09-08 DIAGNOSIS — J90 Pleural effusion, not elsewhere classified: Secondary | ICD-10-CM | POA: Insufficient documentation

## 2012-09-08 MED ORDER — IOHEXOL 300 MG/ML  SOLN
100.0000 mL | Freq: Once | INTRAMUSCULAR | Status: AC | PRN
  Administered 2012-09-08: 100 mL via INTRAVENOUS

## 2012-09-08 MED ORDER — IOHEXOL 300 MG/ML  SOLN
50.0000 mL | Freq: Once | INTRAMUSCULAR | Status: AC | PRN
  Administered 2012-09-08: 50 mL via ORAL

## 2012-09-10 ENCOUNTER — Other Ambulatory Visit: Payer: Self-pay | Admitting: *Deleted

## 2012-09-10 ENCOUNTER — Telehealth: Payer: Self-pay | Admitting: *Deleted

## 2012-09-10 NOTE — Telephone Encounter (Signed)
Called pt at home and left message on voice mail re:  Lab and f/u appts will be rescheduled from 09/11/12 to either Mon or Tues of next week  Since pt is on active research study.

## 2012-09-11 ENCOUNTER — Ambulatory Visit: Payer: Medicaid Other | Admitting: Oncology

## 2012-09-11 ENCOUNTER — Other Ambulatory Visit: Payer: Medicaid Other | Admitting: Lab

## 2012-09-14 ENCOUNTER — Ambulatory Visit: Payer: Medicaid Other | Admitting: Oncology

## 2012-09-14 ENCOUNTER — Other Ambulatory Visit: Payer: Medicaid Other | Admitting: Lab

## 2012-09-14 ENCOUNTER — Encounter: Payer: Self-pay | Admitting: Adult Health

## 2012-09-14 ENCOUNTER — Ambulatory Visit (HOSPITAL_BASED_OUTPATIENT_CLINIC_OR_DEPARTMENT_OTHER): Payer: Medicaid Other | Admitting: Adult Health

## 2012-09-14 ENCOUNTER — Other Ambulatory Visit (HOSPITAL_BASED_OUTPATIENT_CLINIC_OR_DEPARTMENT_OTHER): Payer: Medicaid Other | Admitting: Lab

## 2012-09-14 ENCOUNTER — Encounter: Payer: Self-pay | Admitting: *Deleted

## 2012-09-14 VITALS — BP 121/83 | HR 106 | Temp 97.5°F | Resp 20 | Ht 65.0 in | Wt 190.9 lb

## 2012-09-14 DIAGNOSIS — C7951 Secondary malignant neoplasm of bone: Secondary | ICD-10-CM

## 2012-09-14 DIAGNOSIS — C50419 Malignant neoplasm of upper-outer quadrant of unspecified female breast: Secondary | ICD-10-CM

## 2012-09-14 DIAGNOSIS — J90 Pleural effusion, not elsewhere classified: Secondary | ICD-10-CM

## 2012-09-14 DIAGNOSIS — C50919 Malignant neoplasm of unspecified site of unspecified female breast: Secondary | ICD-10-CM

## 2012-09-14 DIAGNOSIS — I313 Pericardial effusion (noninflammatory): Secondary | ICD-10-CM

## 2012-09-14 DIAGNOSIS — C7800 Secondary malignant neoplasm of unspecified lung: Secondary | ICD-10-CM

## 2012-09-14 DIAGNOSIS — J910 Malignant pleural effusion: Secondary | ICD-10-CM

## 2012-09-14 DIAGNOSIS — C7952 Secondary malignant neoplasm of bone marrow: Secondary | ICD-10-CM

## 2012-09-14 LAB — CANCER ANTIGEN 27.29: CA 27.29: 58 U/mL — ABNORMAL HIGH (ref 0–39)

## 2012-09-14 LAB — CBC WITH DIFFERENTIAL/PLATELET
BASO%: 0.9 % (ref 0.0–2.0)
Eosinophils Absolute: 0.2 10*3/uL (ref 0.0–0.5)
MCHC: 34.6 g/dL (ref 31.5–36.0)
MONO#: 0.7 10*3/uL (ref 0.1–0.9)
NEUT#: 4.6 10*3/uL (ref 1.5–6.5)
RBC: 5.33 10*6/uL (ref 3.70–5.45)
WBC: 6.4 10*3/uL (ref 3.9–10.3)
lymph#: 0.9 10*3/uL (ref 0.9–3.3)

## 2012-09-14 LAB — COMPREHENSIVE METABOLIC PANEL (CC13)
Albumin: 3.4 g/dL — ABNORMAL LOW (ref 3.5–5.0)
Alkaline Phosphatase: 162 U/L — ABNORMAL HIGH (ref 40–150)
CO2: 22 mEq/L (ref 22–29)
Calcium: 10.3 mg/dL (ref 8.4–10.4)
Chloride: 108 mEq/L — ABNORMAL HIGH (ref 98–107)
Glucose: 129 mg/dl — ABNORMAL HIGH (ref 70–99)
Potassium: 4.1 mEq/L (ref 3.5–5.1)
Sodium: 140 mEq/L (ref 136–145)
Total Protein: 8 g/dL (ref 6.4–8.3)

## 2012-09-14 LAB — LIPID PANEL: Total CHOL/HDL Ratio: 4.9 Ratio

## 2012-09-14 LAB — BILIRUBIN, DIRECT: Bilirubin, Direct: <0.1 mg/dL (ref 0.0–0.3)

## 2012-09-14 LAB — CK: Total CK: 82 U/L (ref 7–177)

## 2012-09-14 LAB — PHOSPHORUS: Phosphorus: 2.9 mg/dL (ref 2.3–4.6)

## 2012-09-14 MED ORDER — INV-EVEROLIMUS (RAD0001) 5MG TABLET NOVARTIS CRAD001Y24135: 2.0000 | ORAL_TABLET | Freq: Every day | ORAL | Status: DC
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
## 2012-09-14 NOTE — Progress Notes (Signed)
OFFICE PROGRESS NOTE  CC  Barbara Espy, MD [address] [place] [address]  DIAGNOSIS: 62 year old female with metastatic breast cancer with bone metastasis and lung metastasis.  PRIOR THERAPY:  #1 patient originally presented when she was 62 years old with early stage breast cancer in 2005. At that time she underwent a lumpectomy with axillary lymph node dissection. She was followed by Dr. Pierce Crane and Theron Arista young. Her last visit to Dr. Renelda Loma office was in 2006. Because of difficulties with insurance she had not been receiving mammograms.  #2 in 2012 patient began having shortness of breath on exertion. She was seen by pulmonary service and a CT scan showed some minor mediastinal adenopathy as well as borderline lower lobe nodule. PET scan was not performed. October 2012 patient began to experience more shortness of breath. Initially she was told that she had pneumonia. However subsequently she developed lower extremity edema. CT of the chest was obtained that showed a large pleural effusion in the right side as well as pericardial effusion. She was seen by cardiothoracic surgery placed in 09/11/2011. The cytology revealed that patient indeed have recurrent breast cancer that was hormone receptor positive HER-2/neu was not obtained as it was insufficient tissue. She subsequently had a CT scan performed that showed 2 nodules in the lung suspicious for metastatic disease as well as left lower lobe collapse. She had a biopsy of the lungs performed on 09/11/2011 the tumor was estrogen receptor +91% progesterone receptor +100% proliferation marker Ki-67 35%.  #3 patient was seen by Dr. Pierce Crane and he recommended that she be enrolled on the BOLERO-4 clinical trial utilizing letrozole 2.5 mg per day and affinitor10 mg per day. She initiated this on 10/10/2011.she has had a significant response to this lower extremity edema has reduced significantly in size she is no longer short  of breath.  CURRENT THERAPY:BOLERO -4 with everolimus 10 mg and letrozole 2.5 mg daily. Patient will begin cycle 13 day 1 today. ( 28 day cycles)  INTERVAL HISTORY: Barbara KIRKS 62 y.o. female returns for followup visit prior to cycle 13.  Her main complaints include fatigue, and the arthritis in her fingers.  The hot flashes, forgetfulness, nausea, diarrhea, and cataracts are stable.  Her headaches have resolved.  She is taking the Everolimus and Letrozole daily and doing well.  Otherwise a 10 point ROS is neg.   MEDICAL HISTORY: Past Medical History  Diagnosis Date  . Asthma   . Shortness of breath   . Headache     OTC Prilosec  . Arthritis     left hip  . Contact dermatitis     from a bandaid that had Latex   . breast ca 2005    breast/chemo R mastectomy  . Metastasis to lung dx'd 08/2011    ALLERGIES:  is allergic to aspirin; latex; and nsaids.  MEDICATIONS:  Current Outpatient Prescriptions  Medication Sig Dispense Refill  . cholecalciferol (VITAMIN D) 1000 UNITS tablet Take 2,000 Units by mouth daily.       Marland Kitchen gemfibrozil (LOPID) 600 MG tablet Take 1 tablet (600 mg total) by mouth 2 (two) times daily before a meal.  90 tablet  3  . ibuprofen (ADVIL,MOTRIN) 200 MG tablet Take 400 mg by mouth every 6 (six) hours as needed. For pain      . Investigational everolimus (RAD001) 5 MG tablet [employer]art[employer] [id number] Take 2 tablets by mouth daily. Take with a glass of water.      Marland Kitchen  Investigational everolimus (RAD001) 5 MG tablet Novartis [id number] Take 2 tablets by mouth daily. Take with a glass of water.  70 tablet  0  . letrozole (FEMARA) 2.5 MG tablet Take 1 tablet (2.5 mg total) by mouth daily.  30 tablet  0  . loperamide (IMODIUM) 2 MG capsule Take 2 mg by mouth 4 (four) times daily as needed for diarrhea or loose stools.      . metoprolol tartrate (LOPRESSOR) 25 MG tablet Take 0.5 tablets (12.5 mg total) by mouth daily.  30 tablet  4  . prochlorperazine (COMPAZINE) 10 MG  tablet Take 10 mg by mouth every 6 (six) hours as needed. For nausea       No current facility-administered medications for this visit.    SURGICAL HISTORY:  Past Surgical History  Procedure Laterality Date  . Spine surgery  1996  . Breast surgery  2005    right  . Video bronchoscopy  09/11/2011    Procedure: VIDEO BRONCHOSCOPY;  Surgeon: Alleen Borne, MD;  Location: [facility] OR;  Service: Thoracic;  Laterality: N/A;  . Chest tube insertion  09/11/2011    Procedure: INSERTION PLEURAL DRAINAGE CATHETER;  Surgeon: Alleen Borne, MD;  Location: [facility] OR;  Service: Thoracic;  Laterality: Right;  . Pericardial window  09/11/2011    Procedure: PERICARDIAL WINDOW;  Surgeon: Alleen Borne, MD;  Location: [facility] OR;  Service: Thoracic;  Laterality: N/A;  . Removal of pleural drainage catheter  12/19/2011    Procedure: REMOVAL OF PLEURAL DRAINAGE CATHETER;  Surgeon: Alleen Borne, MD;  Location: [facility] OR;  Service: Thoracic;  Laterality: Right;  TO BE DONE IN MINOR ROOM, SHORT STAY    REVIEW OF SYSTEMS:  General: fatigue (+), night sweats (-), fever (-), pain (-) Lymph: palpable nodes (-) HEENT: vision changes (-), mucositis (-), gum bleeding (-), epistaxis (-) Cardiovascular: chest pain (-), palpitations (-) Pulmonary: shortness of breath (-), dyspnea on exertion (-), cough (-), hemoptysis (-) GI:  Early satiety (-), melena (-), dysphagia (-), nausea/vomiting (-), diarrhea (-) GU: dysuria (-), hematuria (-), incontinence (-) Musculoskeletal: joint swelling (-), joint pain (+), back pain (-) Neuro: weakness (-), numbness (-), headache (-), confusion (-) Skin: Rash (-), lesions (-), dryness (-) Psych: depression (-), suicidal/homicidal ideation (-), feeling of hopelessness (-)   PHYSICAL EXAMINATION: Blood pressure 121/83, pulse 106, temperature 97.5 F (36.4 C), temperature source Oral, resp. rate 20, height 5\' 5"  (1.651 m), weight 190 lb 14.4 oz (86.592 kg). Body mass index is 31.77 kg/(m^2). General:  Patient is a well appearing female in no acute distress HEENT: PERRLA, sclerae anicteric no conjunctival pallor, MMM Neck: supple, no palpable adenopathy Lungs: clear to auscultation bilaterally, no wheezes, rhonchi, or rales Cardiovascular: regular rate rhythm, S1, S2, no murmurs, rubs or gallops Abdomen: Soft, non-tender, non-distended, normoactive bowel sounds, no HSM Extremities: warm and well perfused, no clubbing, cyanosis, or edema Skin: No rashes or lesions Neuro: Non-focal ECOG PERFORMANCE STATUS: 1 - Symptomatic but completely ambulatory    LABORATORY DATA: Lab Results  Component Value Date   WBC 6.4 09/14/2012   HGB 14.2 09/14/2012   HCT 41.2 09/14/2012   MCV 77.3* 09/14/2012   PLT 307 09/14/2012      Chemistry      Component Value Date/Time   NA 140 09/14/2012 0945   NA 136 07/16/2012 0910   K 4.1 09/14/2012 0945   K 4.0 07/16/2012 0910   CL 108* 09/14/2012 0945   CL 103 07/16/2012 0910  CO2 22 09/14/2012 0945   CO2 21 07/16/2012 0910   BUN 18.2 09/14/2012 0945   BUN 16 07/16/2012 0910   CREATININE 1.0 09/14/2012 0945   CREATININE 0.75 07/16/2012 0910      Component Value Date/Time   CALCIUM 10.3 09/14/2012 0945   CALCIUM 10.1 07/16/2012 0910   ALKPHOS 162* 09/14/2012 0945   ALKPHOS 142* 07/16/2012 0910   AST 29 09/14/2012 0945   AST 30 07/16/2012 0910   ALT 37 09/14/2012 0945   ALT 33 07/16/2012 0910   BILITOT 0.28 09/14/2012 0945   BILITOT 0.2* 07/16/2012 0910       RADIOGRAPHIC STUDIES:  Mr Laqueta Jean ZO Contrast  08/21/2012  *RADIOLOGY REPORT*  Clinical Data: Stage IV breast cancer.  Headaches.  MRI HEAD WITHOUT AND WITH CONTRAST  Technique:  Multiplanar, multiecho pulse sequences of the brain and surrounding structures were obtained according to standard protocol without and with intravenous contrast  Contrast: 17mL MULTIHANCE GADOBENATE DIMEGLUMINE 529 MG/ML IV SOLN  Comparison: MRI 10/03/2011  Findings: Previously identified areas of  enhancement in the left  cerebellum have resolved.  This may be due to treated metastatic disease.  No enhancing metastatic deposits are seen on today's study.  Developmental venous anomaly left parietal lobe over the convexity is unchanged.  Ventricle size is normal.  No mass effect or edema is present in the brain.  Cerebellar tonsils remain slightly low lying without definite Chiari malformation.  Paranasal sinuses show mild mucosal thickening.  IMPRESSION: Enhancing lesions  left cerebellum have resolved and may be treated metastatic disease.  Currently no metastatic deposits are identified in the brain.  Developmental venous anomaly left frontal lobe, unchanged.   Original Report Authenticated By: Janeece Riggers, M.D.     ASSESSMENT: 62 year old female with  #1 original diagnosis of breast cancer in 2005. She was treated with a lumpectomy followed by radiation. She apparently did go on antiestrogen therapy. Thereafter she was lost to follow up due to lack of finances and loss of insurance.    2 patient was seen by Dr. Donnie Coffin in March 2013 with metastatic recurrent breast cancer that was ER positive PR positive with lung metastasis malignant pleural effusion and malignant pericardial effusion.she underwent pericardial window as well as Pleurx catheter placement.  #3 she was then begun on BOLERO 4 clinical trial consisting of everolimus 10 mg and letrozole 2.5 mg given on a daily basis on a 28 day cycle. So far she has completed 12 cycles. She is here for cycle number 13.   PLAN:   #1 patient will proceed with cycle 13 of her chemotherapy consisting of everolimus and letrozole.  #2 I reviewed her scans with her, which are again consistent with a partial response.    All questions were answered. The patient knows to call the clinic with any problems, questions or concerns. We can certainly see the patient much sooner if necessary.  I spent 25 minutes counseling the patient face to face. The total time spent in the  appointment was 25 minutes.  Cherie Ouch Lyn Hollingshead, NP Medical Oncology [facility] Phone: [phone]   09/14/2012, 3:51 PM

## 2012-09-14 NOTE — Patient Instructions (Addendum)
Doing well.  See you back in one month.  Please call us if you have any questions or concerns.

## 2012-09-15 ENCOUNTER — Other Ambulatory Visit: Payer: Self-pay | Admitting: *Deleted

## 2012-09-15 ENCOUNTER — Telehealth: Payer: Self-pay | Admitting: Oncology

## 2012-09-15 DIAGNOSIS — C50919 Malignant neoplasm of unspecified site of unspecified female breast: Secondary | ICD-10-CM

## 2012-09-15 NOTE — Progress Notes (Signed)
u

## 2012-09-15 NOTE — Telephone Encounter (Signed)
Lvm advising next appt 3/10 @ 10.15am. Asked pt in vm to pick up a new calendar when she comes in then.

## 2012-09-18 ENCOUNTER — Encounter: Payer: Self-pay | Admitting: Adult Health

## 2012-09-18 NOTE — Progress Notes (Signed)
This encounter was created in error - please disregard.

## 2012-10-05 ENCOUNTER — Other Ambulatory Visit: Payer: Self-pay | Admitting: *Deleted

## 2012-10-05 ENCOUNTER — Encounter: Payer: Self-pay | Admitting: Oncology

## 2012-10-05 ENCOUNTER — Other Ambulatory Visit (HOSPITAL_BASED_OUTPATIENT_CLINIC_OR_DEPARTMENT_OTHER): Payer: Medicaid Other | Admitting: Lab

## 2012-10-05 ENCOUNTER — Encounter: Payer: Self-pay | Admitting: *Deleted

## 2012-10-05 ENCOUNTER — Telehealth: Payer: Self-pay | Admitting: Oncology

## 2012-10-05 ENCOUNTER — Ambulatory Visit (HOSPITAL_BASED_OUTPATIENT_CLINIC_OR_DEPARTMENT_OTHER): Payer: Medicaid Other | Admitting: Oncology

## 2012-10-05 VITALS — BP 116/79 | HR 91 | Temp 97.5°F | Resp 18 | Wt 187.1 lb

## 2012-10-05 DIAGNOSIS — C7800 Secondary malignant neoplasm of unspecified lung: Secondary | ICD-10-CM

## 2012-10-05 DIAGNOSIS — C50919 Malignant neoplasm of unspecified site of unspecified female breast: Secondary | ICD-10-CM

## 2012-10-05 DIAGNOSIS — I313 Pericardial effusion (noninflammatory): Secondary | ICD-10-CM

## 2012-10-05 DIAGNOSIS — J90 Pleural effusion, not elsewhere classified: Secondary | ICD-10-CM

## 2012-10-05 DIAGNOSIS — C50419 Malignant neoplasm of upper-outer quadrant of unspecified female breast: Secondary | ICD-10-CM

## 2012-10-05 DIAGNOSIS — I319 Disease of pericardium, unspecified: Secondary | ICD-10-CM

## 2012-10-05 DIAGNOSIS — J910 Malignant pleural effusion: Secondary | ICD-10-CM

## 2012-10-05 LAB — BILIRUBIN, DIRECT: Bilirubin, Direct: <0.1 mg/dL (ref 0.0–0.3)

## 2012-10-05 LAB — PHOSPHORUS: Phosphorus: 3 mg/dL (ref 2.3–4.6)

## 2012-10-05 LAB — LIPID PANEL: HDL: 47 mg/dL (ref >39)

## 2012-10-05 LAB — COMPREHENSIVE METABOLIC PANEL (CC13)
ALT: 47 U/L (ref 0–55)
AST: 35 U/L — ABNORMAL HIGH (ref 5–34)
Albumin: 3.4 g/dL — ABNORMAL LOW (ref 3.5–5.0)
Calcium: 10.2 mg/dL (ref 8.4–10.4)
Chloride: 110 mEq/L — ABNORMAL HIGH (ref 98–107)
Potassium: 4.2 mEq/L (ref 3.5–5.1)

## 2012-10-05 LAB — CBC WITH DIFFERENTIAL/PLATELET
BASO%: 1.2 % (ref 0.0–2.0)
LYMPH%: 18.2 % (ref 14.0–49.7)
MCHC: 34.1 g/dL (ref 31.5–36.0)
MONO#: 0.8 10*3/uL (ref 0.1–0.9)
MONO%: 12.3 % (ref 0.0–14.0)
Platelets: 252 10*3/uL (ref 145–400)
RBC: 5.31 10*6/uL (ref 3.70–5.45)
WBC: 6.6 10*3/uL (ref 3.9–10.3)

## 2012-10-05 LAB — CK: Total CK: 85 U/L (ref 7–177)

## 2012-10-05 MED ORDER — INV-EVEROLIMUS (RAD0001) 5MG TABLET NOVARTIS CRAD001Y24135: 2.0000 | ORAL_TABLET | Freq: Every day | ORAL | Status: DC

## 2012-10-05 NOTE — Progress Notes (Signed)
10/05/12 at 12:03pm [employer] 4 study notes for cycle 14, day 1- The pt was into the cancer center this am for her cycle 14 assessments.  She confirmed that she was fasting on arrival to the cancer center.  She had her required labs drawn, and Dr. Welton Flakes reviewed them.  Dr. Welton Flakes said that her labs were "good", and her lab abnormals were not clinically significant.  The pt's vitals were taken along with her weight.  The pt's 3 BP's were taken, and the mean BP of 120/80 (rounded value) will be entered in the [facility].  The pt returned her cycle 13 everolimus study drug box.  The pt confirmed that she took her everolimus as prescribed from 09/15/12 through 10/04/12 (20 days).  She also verified that she has taken her letrozole 2.5 mg daily along with her everolimus.  The pt returned 30 unopened blister packs of everolumus (70 dispensed- 40 everolimus pills taken=30 remaining everolimus pills).  Therefore, the pt was 100% compliant in her study drug administration.  The pt reports that she still has fatigue, but she is still able to do most of her usual activities.  She said that she recently bought a golf cart so that she and her dog can ride all around her big yard.  She still buys her groceries and mows her own yard.   ECOG=1. The pt denies any medication changes since her last office visit.  The research nurse reviewed the pt's concomitant medication log with the patient.  No changes were made to the log today.  The pt still reports the following adverse events as ongoing:  hot flashes, memory impairment, nausea, diarrhea, arthritis in hands, post-nasal drip, and bilateral cataracts.  Dr. Welton Flakes examined the pt today and reviewed with the patient the status of her disease by going over her most recent 2014 scans.  The pt was given a copy of her next appointments in April 2014.  The pt was dispensed a new box of everolimus for self administration.  The pt had no question/concerns for the physician and the research nurse.    10/06/12 at 9:12am - The pt's lipid panel was resulted this am.  The pt's triglycerides are finally down to a grade 1 toxicity.  The pt was notified that her lipid panel was improved.    The pt is aware of her new appointments in April 2014.  The pt knows to call the research nurse or Dr. Welton Flakes for any new adverse events or concerns.

## 2012-10-05 NOTE — Progress Notes (Signed)
OFFICE PROGRESS NOTE  CC  Barbara Espy, MD [address] [place] [address]  DIAGNOSIS: 62 year old female with metastatic breast cancer with bone metastasis and lung metastasis.  PRIOR THERAPY:  #1 patient originally presented when she was 62 years old with early stage breast cancer in 2005. At that time she underwent a lumpectomy with axillary lymph node dissection. She was followed by Dr. Pierce Crane and Theron Arista young. Her last visit to Dr. Renelda Loma office was in 2006. Because of difficulties with insurance she had not been receiving mammograms.  #2 in 2012 patient began having shortness of breath on exertion. She was seen by pulmonary service and a CT scan showed some minor mediastinal adenopathy as well as borderline lower lobe nodule. PET scan was not performed. October 2012 patient began to experience more shortness of breath. Initially she was told that she had pneumonia. However subsequently she developed lower extremity edema. CT of the chest was obtained that showed a large pleural effusion in the right side as well as pericardial effusion. She was seen by cardiothoracic surgery placed in 09/11/2011. The cytology revealed that patient indeed have recurrent breast cancer that was hormone receptor positive HER-2/neu was not obtained as it was insufficient tissue. She subsequently had a CT scan performed that showed 2 nodules in the lung suspicious for metastatic disease as well as left lower lobe collapse. She had a biopsy of the lungs performed on 09/11/2011 the tumor was estrogen receptor +91% progesterone receptor +100% proliferation marker Ki-67 35%.  #3 patient was seen by Dr. Pierce Crane and he recommended that she be enrolled on the BOLERO-4 clinical trial utilizing letrozole 2.5 mg per day and affinitor10 mg per day. She initiated this on 10/10/2011.she has had a significant response to this lower extremity edema has reduced significantly in size she is no longer short  of breath.  CURRENT THERAPY:BOLERO -4 with everolimus 10 mg and letrozole 2.5 mg daily. Patient will begin cycle 14 day 1 today. ( 28 day cycles)  INTERVAL HISTORY: Barbara Parks 62 y.o. female returns for followup visit prior to cycle 14.  Her main complaints include fatigue, and the arthritis in her fingers.  The hot flashes, forgetfulness, nausea, diarrhea, and cataracts are stable.  Her headaches have resolved.  She is taking the Everolimus and Letrozole daily and doing well.  Otherwise a 10 point ROS is neg.   MEDICAL HISTORY: Past Medical History  Diagnosis Date  . Asthma   . Shortness of breath   . Headache     OTC Prilosec  . Arthritis     left hip  . Contact dermatitis     from a bandaid that had Latex   . breast ca 2005    breast/chemo R mastectomy  . Metastasis to lung dx'd 08/2011    ALLERGIES:  is allergic to aspirin; latex; and nsaids.  MEDICATIONS:  Current Outpatient Prescriptions  Medication Sig Dispense Refill  . cholecalciferol (VITAMIN D) 1000 UNITS tablet Take 2,000 Units by mouth daily.       Marland Kitchen gemfibrozil (LOPID) 600 MG tablet Take 1 tablet (600 mg total) by mouth 2 (two) times daily before a meal.  90 tablet  3  . Investigational everolimus (RAD001) 5 MG tablet Novartis [id number] Take 2 tablets by mouth daily. Take with a glass of water.  70 tablet  0  . letrozole (FEMARA) 2.5 MG tablet Take 1 tablet (2.5 mg total) by mouth daily.  30 tablet  0  .  metoprolol tartrate (LOPRESSOR) 25 MG tablet Take 0.5 tablets (12.5 mg total) by mouth daily.  30 tablet  4  . ibuprofen (ADVIL,MOTRIN) 200 MG tablet Take 400 mg by mouth every 6 (six) hours as needed. For pain      . loperamide (IMODIUM) 2 MG capsule Take 2 mg by mouth 4 (four) times daily as needed for diarrhea or loose stools.      . prochlorperazine (COMPAZINE) 10 MG tablet Take 10 mg by mouth every 6 (six) hours as needed. For nausea       No current facility-administered medications for this visit.     SURGICAL HISTORY:  Past Surgical History  Procedure Laterality Date  . Spine surgery  1996  . Breast surgery  2005    right  . Video bronchoscopy  09/11/2011    Procedure: VIDEO BRONCHOSCOPY;  Surgeon: Alleen Borne, MD;  Location: [facility] OR;  Service: Thoracic;  Laterality: N/A;  . Chest tube insertion  09/11/2011    Procedure: INSERTION PLEURAL DRAINAGE CATHETER;  Surgeon: Alleen Borne, MD;  Location: [facility] OR;  Service: Thoracic;  Laterality: Right;  . Pericardial window  09/11/2011    Procedure: PERICARDIAL WINDOW;  Surgeon: Alleen Borne, MD;  Location: [facility] OR;  Service: Thoracic;  Laterality: N/A;  . Removal of pleural drainage catheter  12/19/2011    Procedure: REMOVAL OF PLEURAL DRAINAGE CATHETER;  Surgeon: Alleen Borne, MD;  Location: [facility] OR;  Service: Thoracic;  Laterality: Right;  TO BE DONE IN MINOR ROOM, SHORT STAY    REVIEW OF SYSTEMS:  General: fatigue (+), night sweats (-), fever (-), pain (-) Lymph: palpable nodes (-) HEENT: vision changes (-), mucositis (-), gum bleeding (-), epistaxis (-) Cardiovascular: chest pain (-), palpitations (-) Pulmonary: shortness of breath (-), dyspnea on exertion (-), cough (-), hemoptysis (-) GI:  Early satiety (-), melena (-), dysphagia (-), nausea/vomiting (-), diarrhea (-) GU: dysuria (-), hematuria (-), incontinence (-) Musculoskeletal: joint swelling (-), joint pain (+), back pain (-) Neuro: weakness (-), numbness (-), headache (-), confusion (-) Skin: Rash (-), lesions (-), dryness (-) Psych: depression (-), suicidal/homicidal ideation (-), feeling of hopelessness (-)   PHYSICAL EXAMINATION: Blood pressure 116/79, pulse 91, temperature 97.5 F (36.4 C), temperature source Oral, resp. rate 18, weight 187 lb 1 oz (84.851 kg). Body mass index is 31.13 kg/(m^2). General: Patient is a well appearing female in no acute distress HEENT: PERRLA, sclerae anicteric no conjunctival pallor, MMM Neck: supple, no palpable adenopathy Lungs:  clear to auscultation bilaterally, no wheezes, rhonchi, or rales Cardiovascular: regular rate rhythm, S1, S2, no murmurs, rubs or gallops Abdomen: Soft, non-tender, non-distended, normoactive bowel sounds, no HSM Extremities: warm and well perfused, no clubbing, cyanosis, or edema Skin: No rashes or lesions Neuro: Non-focal ECOG PERFORMANCE STATUS: 1 - Symptomatic but completely ambulatory    LABORATORY DATA: Lab Results  Component Value Date   WBC 6.6 10/05/2012   HGB 13.9 10/05/2012   HCT 40.8 10/05/2012   MCV 76.9* 10/05/2012   PLT 252 10/05/2012      Chemistry      Component Value Date/Time   NA 140 09/14/2012 0945   NA 136 07/16/2012 0910   K 4.1 09/14/2012 0945   K 4.0 07/16/2012 0910   CL 108* 09/14/2012 0945   CL 103 07/16/2012 0910   CO2 22 09/14/2012 0945   CO2 21 07/16/2012 0910   BUN 18.2 09/14/2012 0945   BUN 16 07/16/2012 0910   CREATININE 1.0 09/14/2012  0945   CREATININE 0.75 07/16/2012 0910      Component Value Date/Time   CALCIUM 10.3 09/14/2012 0945   CALCIUM 10.1 07/16/2012 0910   ALKPHOS 162* 09/14/2012 0945   ALKPHOS 142* 07/16/2012 0910   AST 29 09/14/2012 0945   AST 30 07/16/2012 0910   ALT 37 09/14/2012 0945   ALT 33 07/16/2012 0910   BILITOT 0.28 09/14/2012 0945   BILITOT 0.2* 07/16/2012 0910       RADIOGRAPHIC STUDIES:  Mr Laqueta Jean YN Contrast  08/21/2012  *RADIOLOGY REPORT*  Clinical Data: Stage IV breast cancer.  Headaches.  MRI HEAD WITHOUT AND WITH CONTRAST  Technique:  Multiplanar, multiecho pulse sequences of the brain and surrounding structures were obtained according to standard protocol without and with intravenous contrast  Contrast: 17mL MULTIHANCE GADOBENATE DIMEGLUMINE 529 MG/ML IV SOLN  Comparison: MRI 10/03/2011  Findings: Previously identified areas of  enhancement in the left cerebellum have resolved.  This may be due to treated metastatic disease.  No enhancing metastatic deposits are seen on today's study.  Developmental venous anomaly  left parietal lobe over the convexity is unchanged.  Ventricle size is normal.  No mass effect or edema is present in the brain.  Cerebellar tonsils remain slightly low lying without definite Chiari malformation.  Paranasal sinuses show mild mucosal thickening.  IMPRESSION: Enhancing lesions  left cerebellum have resolved and may be treated metastatic disease.  Currently no metastatic deposits are identified in the brain.  Developmental venous anomaly left frontal lobe, unchanged.   Original Report Authenticated By: Janeece Riggers, M.D.     ASSESSMENT: 62 year old female with  #1 original diagnosis of breast cancer in 2005. She was treated with a lumpectomy followed by radiation. She apparently did go on antiestrogen therapy. Thereafter she was lost to follow up due to lack of finances and loss of insurance.    2 patient was seen by Dr. Donnie Coffin in March 2013 with metastatic recurrent breast cancer that was ER positive PR positive with lung metastasis malignant pleural effusion and malignant pericardial effusion.she underwent pericardial window as well as Pleurx catheter placement.  #3 she was then begun on BOLERO 4 clinical trial consisting of everolimus 10 mg and letrozole 2.5 mg given on a daily basis on a 28 day cycle. So far she has completed 12 cycles. She is here for cycle number 13.  #4 I have discussed the patient's scans with her in detail. We discussed CT of the chest abdomen pelvis as well as brain MRI. She understands and concurs with our plan of care.   PLAN:   #1 patient will proceed with cycle 14 of her chemotherapy consisting of everolimus and letrozole.  #2 patient will have her next set of scans on 11/03/2012 and then see [place] in followup.  All questions were answered. The patient knows to call the clinic with any problems, questions or concerns. We can certainly see the patient much sooner if necessary.  I spent 25 minutes counseling the patient face to face. The total time spent in  the appointment was 25 minutes.  Drue Second, MD Medical/Oncology [facility] [phone] (beeper) [phone] (Office)  10/05/2012, 10:53 AM

## 2012-10-05 NOTE — Telephone Encounter (Signed)
gv pt appt schedule for April. Per 3/10 pof f/u as scheduled w/ML. Pt scans already on schedule for 4/8. Per pt she will go 2hrs early to drink water based prep.

## 2012-10-05 NOTE — Patient Instructions (Addendum)
Continue cycle 14 of everolimus/letrozole  CT scans on 11/03/12  I will see you back on 11/05/12

## 2012-10-06 ENCOUNTER — Encounter: Payer: Self-pay | Admitting: Oncology

## 2012-10-06 NOTE — Progress Notes (Signed)
Put disability form on nurse's desk. °

## 2012-10-12 ENCOUNTER — Encounter: Payer: Self-pay | Admitting: Oncology

## 2012-10-12 NOTE — Progress Notes (Signed)
Faxed disability form to [employer] @ [phone].

## 2012-10-26 ENCOUNTER — Telehealth: Payer: Self-pay | Admitting: Medical Oncology

## 2012-10-26 NOTE — Telephone Encounter (Signed)
Pt called reporting to have "oozing sores in and between nose and upper lip," states painful and upper lip swollen. Reviewed with MD and Per Dr Welton Flakes, patient to go to urgent care for treatment. Patient states she isn't sure where an local urgent care is. I did a Programmer, multimedia for pt and informed her of one near [employer], pt states she does not feel comfortable going in to an urgent care. Patient encouraged to get evaluated soon so as to start treatment for sores. Patient stated she will call her PCP for an appt in morn. Patient encouraged to call office with further questions or concerns.

## 2012-10-26 NOTE — Telephone Encounter (Signed)
10/26/12 at 5:00pm - The pt called today with complaints of nasal sores and lip sores.  The pt said that they started on 10/24/12 and got more numerous on Sunday 10/25/12.  She said that she did not think they were related to her study drugs.  The research nurse forwarded the pt's call to Dr. Milta Deiters nurse, Hale Drone.  The research nurse and Hale Drone discussed the pt's complaints.  Dr. Milta Deiters nurse called the pt to discuss her symptoms further.  The pt is able to do all of her usual activities.  The nurse was going to discuss with Dr. Welton Flakes.

## 2012-10-29 ENCOUNTER — Encounter: Payer: Self-pay | Admitting: *Deleted

## 2012-10-29 NOTE — Progress Notes (Signed)
10/29/12 at 11:55am - [employer] Bolero 4 - The pt was advised to seek medical treatment on 10/26/12 by Dr. Milta Deiters nurse.  Per telephone note from 10/26/12, the pt was supposed to contact PCP and get an appointment for evaluation of her nasal/lip sores.  The research nurse followed up with the pt on 10/27/12 to check on her status.  The pt was not available by phone, and the research nurse left the pt a voice mail inquiring about her sores.  The patient did not return the nurse's call on 10/27/12.  On Wednesday, 10/28/12, the research nurse called the patient again to inquire if she had been evaluated for her sores.  Rn received a voice mail from the patient late on Wednesday afternoon.  The pt said that her brother and his wife arrived early from [place], and she had not sought medical treatment for her sores due to their arrival.  She said the "sores were not oozing now", and she planned to make an appointment to see her PCP on Friday when her family left.  The research nurse will continue to monitor the pt.    10/29/12 at 3:44pm - The research nurse called pt to discuss her nasal/lip sores.  The pt was not at home.  The research nurse left a message for the pt asking her to return the nurse's call.    10/29/12 at 3:58pm- The pt called back and stated the "sores" have greatly improved.  She said that she used vaseline on the sores with relief.  She said that since the sores are healing then she will not call for appointment with her PCP.  She said that she was going outside to mow her grass now.  The nurse strongly encouraged the pt to wear a hat and protect her face from the sun.  She stated she always wears a hat.  The nurse encouraged the pt to call the cancer center if her sores persist and worsen, and she will be seen by one of our providers.  The pt verbalized understanding.  The pt was aware of her scheduled scans for next week.    11/03/12 at 12pm- The research nurse went to the radiology department to see the pt.   The pt's nose/lip lesions have resolved.  The pt states she is doing well.  She states her mother and youngest brother is coming for a visit soon.  The pt is aware of her office visit on 11/05/12.

## 2012-11-03 ENCOUNTER — Ambulatory Visit (HOSPITAL_COMMUNITY)
Admission: RE | Admit: 2012-11-03 | Discharge: 2012-11-03 | Disposition: A | Discharge status: Home / Self Care | Payer: Self-pay | Source: Ambulatory Visit | Attending: Oncology | Admitting: Oncology

## 2012-11-03 ENCOUNTER — Ambulatory Visit (HOSPITAL_COMMUNITY): Payer: Self-pay

## 2012-11-03 DIAGNOSIS — M5137 Other intervertebral disc degeneration, lumbosacral region: Secondary | ICD-10-CM | POA: Insufficient documentation

## 2012-11-03 DIAGNOSIS — M899 Disorder of bone, unspecified: Secondary | ICD-10-CM | POA: Insufficient documentation

## 2012-11-03 DIAGNOSIS — M51379 Other intervertebral disc degeneration, lumbosacral region without mention of lumbar back pain or lower extremity pain: Secondary | ICD-10-CM | POA: Insufficient documentation

## 2012-11-03 DIAGNOSIS — I700 Atherosclerosis of aorta: Secondary | ICD-10-CM | POA: Insufficient documentation

## 2012-11-03 DIAGNOSIS — C50919 Malignant neoplasm of unspecified site of unspecified female breast: Secondary | ICD-10-CM | POA: Insufficient documentation

## 2012-11-03 DIAGNOSIS — I708 Atherosclerosis of other arteries: Secondary | ICD-10-CM | POA: Insufficient documentation

## 2012-11-03 DIAGNOSIS — Z9010 Acquired absence of unspecified breast and nipple: Secondary | ICD-10-CM | POA: Insufficient documentation

## 2012-11-03 DIAGNOSIS — J984 Other disorders of lung: Secondary | ICD-10-CM | POA: Insufficient documentation

## 2012-11-03 DIAGNOSIS — M949 Disorder of cartilage, unspecified: Secondary | ICD-10-CM | POA: Insufficient documentation

## 2012-11-03 DIAGNOSIS — R911 Solitary pulmonary nodule: Secondary | ICD-10-CM | POA: Insufficient documentation

## 2012-11-03 DIAGNOSIS — K824 Cholesterolosis of gallbladder: Secondary | ICD-10-CM | POA: Insufficient documentation

## 2012-11-03 DIAGNOSIS — K7689 Other specified diseases of liver: Secondary | ICD-10-CM | POA: Insufficient documentation

## 2012-11-03 DIAGNOSIS — J90 Pleural effusion, not elsewhere classified: Secondary | ICD-10-CM | POA: Insufficient documentation

## 2012-11-03 MED ORDER — IOHEXOL 300 MG/ML  SOLN
50.0000 mL | Freq: Once | INTRAMUSCULAR | Status: AC | PRN
  Administered 2012-11-03: 50 mL via ORAL

## 2012-11-03 MED ORDER — IOHEXOL 300 MG/ML  SOLN
100.0000 mL | Freq: Once | INTRAMUSCULAR | Status: AC | PRN
  Administered 2012-11-03: 100 mL via INTRAVENOUS

## 2012-11-05 ENCOUNTER — Encounter: Payer: Self-pay | Admitting: Oncology

## 2012-11-05 ENCOUNTER — Ambulatory Visit (HOSPITAL_BASED_OUTPATIENT_CLINIC_OR_DEPARTMENT_OTHER): Payer: Self-pay | Admitting: Oncology

## 2012-11-05 ENCOUNTER — Other Ambulatory Visit: Payer: Self-pay | Admitting: *Deleted

## 2012-11-05 ENCOUNTER — Other Ambulatory Visit: Payer: Self-pay

## 2012-11-05 ENCOUNTER — Encounter: Payer: Self-pay | Admitting: *Deleted

## 2012-11-05 ENCOUNTER — Telehealth: Payer: Self-pay | Admitting: Oncology

## 2012-11-05 VITALS — BP 127/82 | HR 94 | Temp 97.8°F | Resp 20 | Ht 65.0 in | Wt 186.5 lb

## 2012-11-05 DIAGNOSIS — C50919 Malignant neoplasm of unspecified site of unspecified female breast: Secondary | ICD-10-CM

## 2012-11-05 LAB — COMPREHENSIVE METABOLIC PANEL (CC13)
ALT: 33 U/L (ref 0–55)
AST: 32 U/L (ref 5–34)
Alkaline Phosphatase: 177 U/L — ABNORMAL HIGH (ref 40–150)
BUN: 16.5 mg/dL (ref 7.0–26.0)
Calcium: 10.4 mg/dL (ref 8.4–10.4)
Creatinine: 0.9 mg/dL (ref 0.6–1.1)
Potassium: 4.1 mEq/L (ref 3.5–5.1)
Sodium: 140 mEq/L (ref 136–145)
Total Bilirubin: 0.31 mg/dL (ref 0.20–1.20)

## 2012-11-05 LAB — CBC WITH DIFFERENTIAL/PLATELET
BASO%: 0.7 % (ref 0.0–2.0)
HCT: 42.5 % (ref 34.8–46.6)
MCHC: 32.5 g/dL (ref 31.5–36.0)
MONO#: 0.5 10*3/uL (ref 0.1–0.9)
NEUT#: 4.9 10*3/uL (ref 1.5–6.5)
RBC: 5.34 10*6/uL (ref 3.70–5.45)
WBC: 6.7 10*3/uL (ref 3.9–10.3)
lymph#: 1 10*3/uL (ref 0.9–3.3)

## 2012-11-05 LAB — LIPID PANEL
HDL: 48 mg/dL (ref >39)
Triglycerides: 308 mg/dL — ABNORMAL HIGH (ref <150)

## 2012-11-05 LAB — PHOSPHORUS: Phosphorus: 3.2 mg/dL (ref 2.3–4.6)

## 2012-11-05 MED ORDER — INV-EVEROLIMUS (RAD0001) 5MG TABLET NOVARTIS CRAD001Y24135: 2.0000 | ORAL_TABLET | Freq: Every day | ORAL | Status: DC

## 2012-11-05 NOTE — Telephone Encounter (Signed)
, °

## 2012-11-05 NOTE — Patient Instructions (Signed)
Proceed with cycle 15 of treatment  Doppler of left leg to rule out blood clot

## 2012-11-05 NOTE — Progress Notes (Signed)
OFFICE PROGRESS NOTE  CC  [name], MD [address] [place] [address]  DIAGNOSIS: 62 year old female with metastatic breast cancer with bone metastasis and lung metastasis.  PRIOR THERAPY:  #1 patient originally presented when she was 62 years old with early stage breast cancer in 2005. At that time she underwent a lumpectomy with axillary lymph node dissection. She was followed by Dr. Pierce Crane and Theron Arista young. Her last visit to Dr. Renelda Loma office was in 2006. Because of difficulties with insurance she had not been receiving mammograms.  #2 in 2012 patient began having shortness of breath on exertion. She was seen by pulmonary service and a CT scan showed some minor mediastinal adenopathy as well as borderline lower lobe nodule. PET scan was not performed. October 2012 patient began to experience more shortness of breath. Initially she was told that she had pneumonia. However subsequently she developed lower extremity edema. CT of the chest was obtained that showed a large pleural effusion in the right side as well as pericardial effusion. She was seen by cardiothoracic surgery placed in 09/11/2011. The cytology revealed that patient indeed have recurrent breast cancer that was hormone receptor positive HER-2/neu was not obtained as it was insufficient tissue. She subsequently had a CT scan performed that showed 2 nodules in the lung suspicious for metastatic disease as well as left lower lobe collapse. She had a biopsy of the lungs performed on 09/11/2011 the tumor was estrogen receptor +91% progesterone receptor +100% proliferation marker Ki-67 35%.  #3 patient was seen by Dr. Pierce Crane and he recommended that she be enrolled on the BOLERO-4 clinical trial utilizing letrozole 2.5 mg per day and affinitor10 mg per day. She initiated this on 10/10/2011.she has had a significant response to this lower extremity edema has reduced significantly in size she is no longer short  of breath.  CURRENT THERAPY:BOLERO -4 with everolimus 10 mg and letrozole 2.5 mg daily. Patient will begin cycle 15 day 1 today. ( 28 day cycles)  INTERVAL HISTORY: Barbara Parks 62 y.o. female returns for followup visit prior to cycle 15.  Her main complaints include fatigue, and the arthritis in her fingers.  The hot flashes, forgetfulness, nausea, diarrhea, and cataracts are stable.  Her headaches have resolved.  She is taking the Everolimus and Letrozole daily and doing well. Patient is complaining of swelling in the left ankle and hands this may be due to the letrozole. However especially in the lower extremity I would like to do a Doppler ultrasound I will order this. Otherwise a 10 point ROS is neg.   MEDICAL HISTORY: Past Medical History  Diagnosis Date  . Asthma   . Shortness of breath   . Headache     OTC Prilosec  . Arthritis     left hip  . Contact dermatitis     from a bandaid that had Latex   . breast ca 2005    breast/chemo R mastectomy  . Metastasis to lung dx'd 08/2011    ALLERGIES:  is allergic to aspirin; latex; and nsaids.  MEDICATIONS:  Current Outpatient Prescriptions  Medication Sig Dispense Refill  . cholecalciferol (VITAMIN D) 1000 UNITS tablet Take 2,000 Units by mouth daily.       Marland Kitchen gemfibrozil (LOPID) 600 MG tablet Take 1 tablet (600 mg total) by mouth 2 (two) times daily before a meal.  90 tablet  3  . ibuprofen (ADVIL,MOTRIN) 200 MG tablet Take 400 mg by mouth every 6 (six)  hours as needed. For pain      . Investigational everolimus (RAD001) 5 MG tablet [employer] [id number] Take 2 tablets by mouth daily. Take with a glass of water.  70 tablet  0  . Investigational everolimus (RAD001) 5 MG tablet [employer] [id number] Take 2 tablets by mouth daily. Take with a glass of water.  70 tablet  0  . Investigational everolimus (RAD001) 5 MG tablet [employer] [id number] Take 2 tablets by mouth daily. Take with a glass of water.  70 tablet  0  . letrozole  (FEMARA) 2.5 MG tablet Take 1 tablet (2.5 mg total) by mouth daily.  30 tablet  0  . loperamide (IMODIUM) 2 MG capsule Take 2 mg by mouth 4 (four) times daily as needed for diarrhea or loose stools.      . metoprolol tartrate (LOPRESSOR) 25 MG tablet Take 0.5 tablets (12.5 mg total) by mouth daily.  30 tablet  4  . prochlorperazine (COMPAZINE) 10 MG tablet Take 10 mg by mouth every 6 (six) hours as needed. For nausea       No current facility-administered medications for this visit.    SURGICAL HISTORY:  Past Surgical History  Procedure Laterality Date  . Spine surgery  1996  . Breast surgery  2005    right  . Video bronchoscopy  09/11/2011    Procedure: VIDEO BRONCHOSCOPY;  Surgeon: Alleen Borne, MD;  Location: [facility] OR;  Service: Thoracic;  Laterality: N/A;  . Chest tube insertion  09/11/2011    Procedure: INSERTION PLEURAL DRAINAGE CATHETER;  Surgeon: Alleen Borne, MD;  Location: [facility] OR;  Service: Thoracic;  Laterality: Right;  . Pericardial window  09/11/2011    Procedure: PERICARDIAL WINDOW;  Surgeon: Alleen Borne, MD;  Location: [facility] OR;  Service: Thoracic;  Laterality: N/A;  . Removal of pleural drainage catheter  12/19/2011    Procedure: REMOVAL OF PLEURAL DRAINAGE CATHETER;  Surgeon: Alleen Borne, MD;  Location: [facility] OR;  Service: Thoracic;  Laterality: Right;  TO BE DONE IN MINOR ROOM, SHORT STAY    REVIEW OF SYSTEMS:  General: fatigue (+), night sweats (-), fever (-), pain (-) Lymph: palpable nodes (-) HEENT: vision changes (-), mucositis (-), gum bleeding (-), epistaxis (-) Cardiovascular: chest pain (-), palpitations (-) Pulmonary: shortness of breath (-), dyspnea on exertion (-), cough (-), hemoptysis (-) GI:  Early satiety (-), melena (-), dysphagia (-), nausea/vomiting (-), diarrhea (-) GU: dysuria (-), hematuria (-), incontinence (-) Musculoskeletal: joint swelling (-), joint pain (+), back pain (-) Neuro: weakness (-), numbness (-), headache (-), confusion (-) Skin:  Rash (-), lesions (-), dryness (-) Psych: depression (-), suicidal/homicidal ideation (-), feeling of hopelessness (-)   PHYSICAL EXAMINATION: Blood pressure 127/82, pulse 94, temperature 97.8 F (36.6 C), temperature source Oral, resp. rate 20, height 5\' 5"  (1.651 m), weight 186 lb 8 oz (84.596 kg). Body mass index is 31.04 kg/(m^2). General: Patient is a well appearing female in no acute distress HEENT: PERRLA, sclerae anicteric no conjunctival pallor, MMM Neck: supple, no palpable adenopathy Lungs: clear to auscultation bilaterally, no wheezes, rhonchi, or rales Cardiovascular: regular rate rhythm, S1, S2, no murmurs, rubs or gallops Abdomen: Soft, non-tender, non-distended, normoactive bowel sounds, no HSM Extremities: warm and well perfused, no clubbing, cyanosis, or edema Skin: No rashes or lesions Neuro: Non-focal ECOG PERFORMANCE STATUS: 1 - Symptomatic but completely ambulatory    LABORATORY DATA: Lab Results  Component Value Date   WBC 6.7 11/05/2012   HGB 13.8 11/05/2012  HCT 42.5 11/05/2012   MCV 79.6 11/05/2012   PLT 332 11/05/2012      Chemistry      Component Value Date/Time   NA 140 11/05/2012 1024   NA 136 07/16/2012 0910   K 4.1 11/05/2012 1024   K 4.0 07/16/2012 0910   CL 108* 11/05/2012 1024   CL 103 07/16/2012 0910   CO2 22 11/05/2012 1024   CO2 21 07/16/2012 0910   BUN 16.5 11/05/2012 1024   BUN 16 07/16/2012 0910   CREATININE 0.9 11/05/2012 1024   CREATININE 0.75 07/16/2012 0910      Component Value Date/Time   CALCIUM 10.4 11/05/2012 1024   CALCIUM 10.1 07/16/2012 0910   ALKPHOS 177* 11/05/2012 1024   ALKPHOS 142* 07/16/2012 0910   AST 32 11/05/2012 1024   AST 30 07/16/2012 0910   ALT 33 11/05/2012 1024   ALT 33 07/16/2012 0910   BILITOT 0.31 11/05/2012 1024   BILITOT 0.2* 07/16/2012 0910       RADIOGRAPHIC STUDIES:  Mr Laqueta Jean ZO Contrast  08/21/2012  *RADIOLOGY REPORT*  Clinical Data: Stage IV breast cancer.  Headaches.  MRI HEAD WITHOUT AND  WITH CONTRAST  Technique:  Multiplanar, multiecho pulse sequences of the brain and surrounding structures were obtained according to standard protocol without and with intravenous contrast  Contrast: 17mL MULTIHANCE GADOBENATE DIMEGLUMINE 529 MG/ML IV SOLN  Comparison: MRI 10/03/2011  Findings: Previously identified areas of  enhancement in the left cerebellum have resolved.  This may be due to treated metastatic disease.  No enhancing metastatic deposits are seen on today's study.  Developmental venous anomaly left parietal lobe over the convexity is unchanged.  Ventricle size is normal.  No mass effect or edema is present in the brain.  Cerebellar tonsils remain slightly low lying without definite Chiari malformation.  Paranasal sinuses show mild mucosal thickening.  IMPRESSION: Enhancing lesions  left cerebellum have resolved and may be treated metastatic disease.  Currently no metastatic deposits are identified in the brain.  Developmental venous anomaly left frontal lobe, unchanged.   Original Report Authenticated By: Janeece Riggers, M.D.     ASSESSMENT: 61 year old female with  #1 original diagnosis of breast cancer in 2005. She was treated with a lumpectomy followed by radiation. She apparently did go on antiestrogen therapy. Thereafter she was lost to follow up due to lack of finances and loss of insurance.    2 patient was seen by Dr. Donnie Coffin in March 2013 with metastatic recurrent breast cancer that was ER positive PR positive with lung metastasis malignant pleural effusion and malignant pericardial effusion.she underwent pericardial window as well as Pleurx catheter placement.  #3 she was then begun on BOLERO 4 clinical trial consisting of everolimus 10 mg and letrozole 2.5 mg given on a daily basis on a 28 day cycle. So far she has completed 12 cycles. She is here for cycle number 13.  #4 I have discussed the patient's scans with her in detail. We discussed CT of the chest abdomen pelvis as well as  brain MRI. She understands and concurs with our plan of care.  #5 patient had CT scans performed on 11/03/2012. This showed a subtle but partially sclerotic lesion of the left sacrum possibly just healing bone. Per reading. Also there is a stable density in the right iliac crest. The scan to me does not show progressive disease  #6 possible ankle swelling on the left side DVT versus to a side effect from letrozole. I will order  a Doppler ultrasound.   PLAN:   #1 patient will proceed with cycle 15 of her chemotherapy consisting of everolimus and letrozole.  #2 She will return in 4 weeks' time for followup  All questions were answered. The patient knows to call the clinic with any problems, questions or concerns. We can certainly see the patient much sooner if necessary.  I spent 25 minutes counseling the patient face to face. The total time spent in the appointment was 25 minutes.  Drue Second, MD [facility] [phone] (beeper) [phone] (Office)  11/05/2012, 6:44 PM

## 2012-11-05 NOTE — Progress Notes (Signed)
11/05/12 at 4:21pm - [employer] 4 cycle 15, day 1 study notes.  The pt was into the cancer center this am.  She confirmed that she has been fasting overnight and this morning for her blood draw.  The pt returned her cycle 14 study drug kit with 8 unopened blister packs of everolimus.  She confirmed that she took 2 doses of everolimus (10mg  total daily dose) from 10/05/12 through 11/04/12 (31 days x 2 doses= 62).  The pt was dispensed 70 blister packs of everolimus on 10/05/12 and she took 62 pills.  Therefore, she is 100% compliant with her study drug.  The pt's labs were reviewed by Dr. Welton Flakes, and her MD did not feel her lab abnormals were clinically significant.  The pt's weight and vitals were taken this am.  Her 3 BP's were taken, and the mean BP will be reported in the [facility].  The pt reports that she has ongoing fatigue, but she is still able to do all of her usual errands.  She said that she works in her 1 acre yard, but she has to take frequent breaks.  ECOG =1.  The pt denies any new medications changes.  She does report using psoriasin on her nasal/lip sores.  The research nurse will update the pt's medication log with this new medication.  She said that she started the medicine on 10/26/12 and stopped it on 10/29/12(1 application BID).  The pt states that her left foot began to swell about 2 weeks ago on 10/22/12  (lower extremity edema-grade 2).  Dr. Welton Flakes saw and examined the pt.  Dr. Welton Flakes wanted the pt to have a doppler.  The pt reports the following adverse events as ongoing:  hot flashes, memory impairment, intermittent nausea, intermittent diarrhea, arthritis in her hands, bilateral cataracts, and fatigue.  The pt's restaging scans were reviewed by Dr. Welton Flakes.  Dr. Welton Flakes signed the pt's RECIST worksheet.  Dr. Welton Flakes felt the pt was doing well on her current treatment.  She said that the scans show no definite evidence of progression. Dr. Welton Flakes advised the pt to continue on her current regimen of treatment with no  dose modifications.  Dr. Ova Freshwater completed an addendum on her CT report regarding the "sacrum sclerotic lesion" that was mentioned in the report.  He stated that the "increased conspicuity of the lesion could simply be a function of interval effective therapy causing sclerosis, rather than necessarily being indicative of true progression since 10/03/2011".  The pt is aware of her future appointments.    11/09/12 at 9:47am - The pt did not have a DVT.

## 2012-11-06 ENCOUNTER — Ambulatory Visit (HOSPITAL_COMMUNITY)
Admission: RE | Admit: 2012-11-06 | Discharge: 2012-11-06 | Disposition: A | Discharge status: Home / Self Care | Payer: Self-pay | Source: Ambulatory Visit | Attending: Oncology | Admitting: Oncology

## 2012-11-06 ENCOUNTER — Telehealth: Payer: Self-pay | Admitting: Oncology

## 2012-11-06 DIAGNOSIS — M7989 Other specified soft tissue disorders: Secondary | ICD-10-CM | POA: Insufficient documentation

## 2012-11-06 DIAGNOSIS — Z79899 Other long term (current) drug therapy: Secondary | ICD-10-CM | POA: Insufficient documentation

## 2012-11-06 DIAGNOSIS — C50919 Malignant neoplasm of unspecified site of unspecified female breast: Secondary | ICD-10-CM

## 2012-11-06 NOTE — Telephone Encounter (Signed)
, °

## 2012-11-06 NOTE — Progress Notes (Signed)
VASCULAR LAB PRELIMINARY  PRELIMINARY  PRELIMINARY  PRELIMINARY  Left lower extremity venous duplex completed.    Preliminary report:  Left:  No evidence of DVT, superficial thrombosis, or Baker's cyst.  Barbara Parks, RVT 11/06/2012, 4:23 PM

## 2012-12-03 ENCOUNTER — Other Ambulatory Visit (HOSPITAL_BASED_OUTPATIENT_CLINIC_OR_DEPARTMENT_OTHER): Payer: Self-pay | Admitting: Lab

## 2012-12-03 ENCOUNTER — Telehealth: Payer: Self-pay | Admitting: *Deleted

## 2012-12-03 ENCOUNTER — Other Ambulatory Visit: Payer: Self-pay | Admitting: *Deleted

## 2012-12-03 ENCOUNTER — Ambulatory Visit (HOSPITAL_BASED_OUTPATIENT_CLINIC_OR_DEPARTMENT_OTHER): Payer: Self-pay | Admitting: Oncology

## 2012-12-03 ENCOUNTER — Encounter: Payer: Self-pay | Admitting: Oncology

## 2012-12-03 ENCOUNTER — Encounter: Payer: Self-pay | Admitting: *Deleted

## 2012-12-03 VITALS — BP 108/75 | HR 109 | Temp 97.8°F | Resp 20 | Ht 65.0 in | Wt 183.6 lb

## 2012-12-03 DIAGNOSIS — C50919 Malignant neoplasm of unspecified site of unspecified female breast: Secondary | ICD-10-CM

## 2012-12-03 DIAGNOSIS — C50419 Malignant neoplasm of upper-outer quadrant of unspecified female breast: Secondary | ICD-10-CM

## 2012-12-03 DIAGNOSIS — C7800 Secondary malignant neoplasm of unspecified lung: Secondary | ICD-10-CM

## 2012-12-03 DIAGNOSIS — B353 Tinea pedis: Secondary | ICD-10-CM

## 2012-12-03 DIAGNOSIS — C7951 Secondary malignant neoplasm of bone: Secondary | ICD-10-CM

## 2012-12-03 LAB — COMPREHENSIVE METABOLIC PANEL (CC13)
Alkaline Phosphatase: 164 U/L — ABNORMAL HIGH (ref 40–150)
CO2: 18 mEq/L — ABNORMAL LOW (ref 22–29)
Creatinine: 0.9 mg/dL (ref 0.6–1.1)
Glucose: 130 mg/dl — ABNORMAL HIGH (ref 70–99)
Total Bilirubin: 0.32 mg/dL (ref 0.20–1.20)

## 2012-12-03 LAB — CBC WITH DIFFERENTIAL/PLATELET
BASO%: 0.6 % (ref 0.0–2.0)
Basophils Absolute: 0 10*3/uL (ref 0.0–0.1)
EOS%: 2.5 % (ref 0.0–7.0)
HCT: 42.6 % (ref 34.8–46.6)
HGB: 13.8 g/dL (ref 11.6–15.9)
LYMPH%: 16.3 % (ref 14.0–49.7)
MCH: 26.2 pg (ref 25.1–34.0)
MCHC: 32.4 g/dL (ref 31.5–36.0)
MONO#: 0.7 10*3/uL (ref 0.1–0.9)
NEUT%: 70.2 % (ref 38.4–76.8)
Platelets: 253 10*3/uL (ref 145–400)
lymph#: 1.1 10*3/uL (ref 0.9–3.3)

## 2012-12-03 LAB — URIC ACID (CC13): Uric Acid, Serum: 3.5 mg/dl (ref 2.6–7.4)

## 2012-12-03 LAB — LIPID PANEL
HDL: 46 mg/dL (ref >39)
Total CHOL/HDL Ratio: 4.4 Ratio
Triglycerides: 266 mg/dL — ABNORMAL HIGH (ref <150)

## 2012-12-03 LAB — BILIRUBIN, DIRECT: Bilirubin, Direct: 0.1 mg/dL (ref 0.0–0.3)

## 2012-12-03 LAB — CK: Total CK: 76 U/L (ref 7–177)

## 2012-12-03 MED ORDER — GEMFIBROZIL 600 MG PO TABS: 600.0000 mg | ORAL_TABLET | Freq: Two times a day (BID) | ORAL | Status: DC

## 2012-12-03 MED ORDER — LETROZOLE 2.5 MG PO TABS: 2.5000 mg | ORAL_TABLET | Freq: Every day | ORAL | Status: DC

## 2012-12-03 MED ORDER — INV-EVEROLIMUS (RAD0001) 5MG TABLET NOVARTIS CRAD001Y24135: 2.0000 | ORAL_TABLET | Freq: Every day | ORAL | Status: DC

## 2012-12-03 NOTE — Patient Instructions (Signed)
Doing well , continue present therapy  Refer to podiatry  I will see you back in June

## 2012-12-03 NOTE — Progress Notes (Signed)
12/03/12 at 11:04am - [employer], Bolero 4 cycle 16, day 1 study notes- The pt was into the cancer center this morning for her cycle 16, day 1 assessments.  The pt confirmed that she has been fasting overnight and this morning for her required labs.  The pt returned her cycle 15 everolimus study drug box.  The pt verified that she took her everolimus 5mg  twice daily from 11/05/12 through 12/02/12 (28 days).  The pt also verified that she has taken letrozole daily with the everolimus.  The pt's drug box had 14 unopened blister packs of everolimus.  There was 56 opened blister packs of everolimus.  Therefore, the pt was 100% complaint with her study drug, everolimus.  The pt's weight and vitals were taken prior to her md physical examination. The pt's BP was taken 3 times, and the mean BP (118/78) will be recorded on the [facility] page for study purposes.  The pt's current medications were reviewed with the pt.  The pt states she has recently started using Lotrimin cream for her bilateral athlete's foot on 11/25/12.  She reports she is still taking the following medications:  lopressor daily, compazine as needed, immodium as needed, lopid daily, ibuprofen as needed, and vitamin D3 daily.  The pt's concomitant medication log will be updated, and Lotrimin will be added as a new concomitant medication.  The pt was seen and examined today by Dr. Welton Flakes.  The pt states she is able to do most of her normal activities, but she still can't do strenuous activities.  ECOG =1.  Dr. Welton Flakes reviewed her lab work, and she felt the pt should continue on her current course of treatment without any dose modifications.   Dr. Welton Flakes also felt that her lab abnormals were not clinically significant.  The pt's lipid panel is still pending.  Dr. Welton Flakes examined the pt's feet.  The pt stated her athlete's foot began on 11/24/12 after a day of fishing with her family.  The physician made a referral to a podiatrist for further evaluation.  Dr. Welton Flakes did not feel that  her athlete's foot was related to the pt's study drugs.  The pt reports the following adverse events as ongoing:  hot flashes, memory impairment, intermittent nausea and diarrhea, arthritis in her hands (felt to be related to letrozole), stable bilateral cataracts, lower extremity edema and fatigue.  The pt denies any post-nasal drip.  The pt was given her cycle 17 appointments for 12/31/12.  The pt will have scans performed on 12/29/12.  The pt knows to call the research nurse for any concerns.    12/03/12 at 2:40pm - The pt's lipid panel was finalized and the values are all stable.

## 2012-12-03 NOTE — Telephone Encounter (Signed)
appts made and printed. Pt is aware that cs will call her w/ appt for her CT...td

## 2012-12-03 NOTE — Progress Notes (Signed)
OFFICE PROGRESS NOTE  CC  [name], MD [address] [place] [address]  DIAGNOSIS: 62 year old female with metastatic breast cancer with bone metastasis and lung metastasis.  PRIOR THERAPY:  #1 patient originally presented when she was 62 years old with early stage breast cancer in 2005. At that time she underwent a lumpectomy with axillary lymph node dissection. She was followed by Dr. Pierce Crane and Theron Arista young. Her last visit to Dr. Renelda Loma office was in 2006. Because of difficulties with insurance she had not been receiving mammograms.  #2 in 2012 patient began having shortness of breath on exertion. She was seen by pulmonary service and a CT scan showed some minor mediastinal adenopathy as well as borderline lower lobe nodule. PET scan was not performed. October 2012 patient began to experience more shortness of breath. Initially she was told that she had pneumonia. However subsequently she developed lower extremity edema. CT of the chest was obtained that showed a large pleural effusion in the right side as well as pericardial effusion. She was seen by cardiothoracic surgery placed in 09/11/2011. The cytology revealed that patient indeed have recurrent breast cancer that was hormone receptor positive HER-2/neu was not obtained as it was insufficient tissue. She subsequently had a CT scan performed that showed 2 nodules in the lung suspicious for metastatic disease as well as left lower lobe collapse. She had a biopsy of the lungs performed on 09/11/2011 the tumor was estrogen receptor +91% progesterone receptor +100% proliferation marker Ki-67 35%.  #3 patient was seen by Dr. Pierce Crane and he recommended that she be enrolled on the BOLERO-4 clinical trial utilizing letrozole 2.5 mg per day and affinitor10 mg per day. She initiated this on 10/10/2011.she has had a significant response to this lower extremity edema has reduced significantly in size she is no longer short  of breath.  CURRENT THERAPY:BOLERO -4 with everolimus 10 mg and letrozole 2.5 mg daily. Patient will begin cycle 15 day 1 today. ( 28 day cycles)  INTERVAL HISTORY: Barbara Parks 62 y.o. female returns for followup visit prior to cycle 15.  Her main complaints include fatigue, and the arthritis in her fingers.  The hot flashes, forgetfulness, nausea, diarrhea, and cataracts are stable.  Her headaches have resolved.  She is taking the Everolimus and Letrozole daily and doing well. Patient is complaining of swelling in the left ankle and hands this may be due to the letrozole. However especially in the lower extremity I would like to do a Doppler ultrasound I will order this. Otherwise a 10 point ROS is neg.   MEDICAL HISTORY: Past Medical History  Diagnosis Date  . Asthma   . Shortness of breath   . Headache     OTC Prilosec  . Arthritis     left hip  . Contact dermatitis     from a bandaid that had Latex   . breast ca 2005    breast/chemo R mastectomy  . Metastasis to lung dx'd 08/2011    ALLERGIES:  is allergic to aspirin; latex; and nsaids.  MEDICATIONS:  Current Outpatient Prescriptions  Medication Sig Dispense Refill  . cholecalciferol (VITAMIN D) 1000 UNITS tablet Take 2,000 Units by mouth daily.       . clotrimazole (LOTRIMIN) 1 % cream Apply topically 3 (three) times daily. To both feet      . gemfibrozil (LOPID) 600 MG tablet Take 1 tablet (600 mg total) by mouth 2 (two) times daily before a meal.  90 tablet  3  . ibuprofen (ADVIL,MOTRIN) 200 MG tablet Take 400 mg by mouth every 6 (six) hours as needed. For pain      . Investigational everolimus (RAD001) 5 MG tablet [employer] [id number] Take 2 tablets by mouth daily. Take with a glass of water.  70 tablet  0  . letrozole (FEMARA) 2.5 MG tablet Take 1 tablet (2.5 mg total) by mouth daily.  30 tablet  0  . loperamide (IMODIUM) 2 MG capsule Take 2 mg by mouth 4 (four) times daily as needed for diarrhea or loose stools.       . metoprolol tartrate (LOPRESSOR) 25 MG tablet Take 0.5 tablets (12.5 mg total) by mouth daily.  30 tablet  4  . prochlorperazine (COMPAZINE) 10 MG tablet Take 10 mg by mouth every 6 (six) hours as needed. For nausea       No current facility-administered medications for this visit.    SURGICAL HISTORY:  Past Surgical History  Procedure Laterality Date  . Spine surgery  1996  . Breast surgery  2005    right  . Video bronchoscopy  09/11/2011    Procedure: VIDEO BRONCHOSCOPY;  Surgeon: Alleen Borne, MD;  Location: [facility] OR;  Service: Thoracic;  Laterality: N/A;  . Chest tube insertion  09/11/2011    Procedure: INSERTION PLEURAL DRAINAGE CATHETER;  Surgeon: Alleen Borne, MD;  Location: [facility] OR;  Service: Thoracic;  Laterality: Right;  . Pericardial window  09/11/2011    Procedure: PERICARDIAL WINDOW;  Surgeon: Alleen Borne, MD;  Location: [facility] OR;  Service: Thoracic;  Laterality: N/A;  . Removal of pleural drainage catheter  12/19/2011    Procedure: REMOVAL OF PLEURAL DRAINAGE CATHETER;  Surgeon: Alleen Borne, MD;  Location: [facility] OR;  Service: Thoracic;  Laterality: Right;  TO BE DONE IN MINOR ROOM, SHORT STAY    REVIEW OF SYSTEMS:  General: fatigue (+), night sweats (-), fever (-), pain (-) Lymph: palpable nodes (-) HEENT: vision changes (-), mucositis (-), gum bleeding (-), epistaxis (-) Cardiovascular: chest pain (-), palpitations (-) Pulmonary: shortness of breath (-), dyspnea on exertion (-), cough (-), hemoptysis (-) GI:  Early satiety (-), melena (-), dysphagia (-), nausea/vomiting (-), diarrhea (-) GU: dysuria (-), hematuria (-), incontinence (-) Musculoskeletal: joint swelling (-), joint pain (+), back pain (-) Neuro: weakness (-), numbness (-), headache (-), confusion (-) Skin: Rash (-), lesions (-), dryness (-) Psych: depression (-), suicidal/homicidal ideation (-), feeling of hopelessness (-)   PHYSICAL EXAMINATION: Blood pressure 108/75, pulse 109, temperature 97.8 F (36.6  C), temperature source Oral, resp. rate 20, height 5\' 5"  (1.651 m), weight 183 lb 9.6 oz (83.28 kg). Body mass index is 30.55 kg/(m^2). General: Patient is a well appearing female in no acute distress HEENT: PERRLA, sclerae anicteric no conjunctival pallor, MMM Neck: supple, no palpable adenopathy Lungs: clear to auscultation bilaterally, no wheezes, rhonchi, or rales Cardiovascular: regular rate rhythm, S1, S2, no murmurs, rubs or gallops Abdomen: Soft, non-tender, non-distended, normoactive bowel sounds, no HSM Extremities: warm and well perfused, no clubbing, cyanosis, or edema Skin: No rashes or lesions Neuro: Non-focal ECOG PERFORMANCE STATUS: 1 - Symptomatic but completely ambulatory    LABORATORY DATA: Lab Results  Component Value Date   WBC 6.5 12/03/2012   HGB 13.8 12/03/2012   HCT 42.6 12/03/2012   MCV 80.8 12/03/2012   PLT 253 12/03/2012      Chemistry      Component Value Date/Time   NA 142 12/03/2012 0854  NA 136 07/16/2012 0910   K 3.8 12/03/2012 0854   K 4.0 07/16/2012 0910   CL 109* 12/03/2012 0854   CL 103 07/16/2012 0910   CO2 18* 12/03/2012 0854   CO2 21 07/16/2012 0910   BUN 21.0 12/03/2012 0854   BUN 16 07/16/2012 0910   CREATININE 0.9 12/03/2012 0854   CREATININE 0.75 07/16/2012 0910      Component Value Date/Time   CALCIUM 10.1 12/03/2012 0854   CALCIUM 10.1 07/16/2012 0910   ALKPHOS 164* 12/03/2012 0854   ALKPHOS 142* 07/16/2012 0910   AST 28 12/03/2012 0854   AST 30 07/16/2012 0910   ALT 27 12/03/2012 0854   ALT 33 07/16/2012 0910   BILITOT 0.32 12/03/2012 0854   BILITOT 0.2* 07/16/2012 0910       RADIOGRAPHIC STUDIES:  Mr Laqueta Jean ZO Contrast  08/21/2012  *RADIOLOGY REPORT*  Clinical Data: Stage IV breast cancer.  Headaches.  MRI HEAD WITHOUT AND WITH CONTRAST  Technique:  Multiplanar, multiecho pulse sequences of the brain and surrounding structures were obtained according to standard protocol without and with intravenous contrast  Contrast: 17mL MULTIHANCE  GADOBENATE DIMEGLUMINE 529 MG/ML IV SOLN  Comparison: MRI 10/03/2011  Findings: Previously identified areas of  enhancement in the left cerebellum have resolved.  This may be due to treated metastatic disease.  No enhancing metastatic deposits are seen on today's study.  Developmental venous anomaly left parietal lobe over the convexity is unchanged.  Ventricle size is normal.  No mass effect or edema is present in the brain.  Cerebellar tonsils remain slightly low lying without definite Chiari malformation.  Paranasal sinuses show mild mucosal thickening.  IMPRESSION: Enhancing lesions  left cerebellum have resolved and may be treated metastatic disease.  Currently no metastatic deposits are identified in the brain.  Developmental venous anomaly left frontal lobe, unchanged.   Original Report Authenticated By: Janeece Riggers, M.D.     ASSESSMENT: 62 year old female with  #1 original diagnosis of breast cancer in 2005. She was treated with a lumpectomy followed by radiation. She apparently did go on antiestrogen therapy. Thereafter she was lost to follow up due to lack of finances and loss of insurance.    2 patient was seen by Dr. Donnie Coffin in March 2013 with metastatic recurrent breast cancer that was ER positive PR positive with lung metastasis malignant pleural effusion and malignant pericardial effusion.she underwent pericardial window as well as Pleurx catheter placement.  #3 she was then begun on BOLERO 4 clinical trial consisting of everolimus 10 mg and letrozole 2.5 mg given on a daily basis on a 28 day cycle. So far she has completed 12 cycles. She is here for cycle number 13.  #4 I have discussed the patient's scans with her in detail. We discussed CT of the chest abdomen pelvis as well as brain MRI. She understands and concurs with our plan of care.  #5 patient had CT scans performed on 11/03/2012. This showed a subtle but partially sclerotic lesion of the left sacrum possibly just healing bone.  Per reading. Also there is a stable density in the right iliac crest. The scan to me does not show progressive disease  #6 Athlete's foot     PLAN:   #1 patient will proceed with cycle 16 of her chemotherapy consisting of everolimus and letrozole.  #2 refer to podiatry  #2 She will return in 4 weeks' time for followup  All questions were answered. The patient knows to call the clinic with  any problems, questions or concerns. We can certainly see the patient much sooner if necessary.  I spent 25 minutes counseling the patient face to face. The total time spent in the appointment was 25 minutes.  Drue Second, MD Medical/Oncology [facility] [phone] (beeper) [phone] (Office)  12/03/2012, 9:46 AM

## 2012-12-10 ENCOUNTER — Encounter: Payer: Self-pay | Admitting: *Deleted

## 2012-12-10 ENCOUNTER — Telehealth: Payer: Self-pay | Admitting: Oncology

## 2012-12-10 NOTE — Progress Notes (Signed)
12/10/12.  The pt was referred by Dr. Welton Flakes to a podiatrist for her athlete's foot.  The pt has medicaid, and she did not have a referral from her PCP to see a podiatrist.  Therefore, the pt decided to see her primary care physician, Dr. Shaune Pollack. The research nurse received a call from Dr. Shaune Pollack regarding the pt's new prescribed medications.  The MD stated the pt had informed her that she is on a research study and that any new prescriptions need to be reviewed for possible drug interactions with everolimus.  Dr. Kevan Ny said the pt has a confirmed diagnosis of athlete's foot (grade 2 skin infection).  She wants the pt to take Lamisil orally for 30 days along with econazole (topically)  The research nurse contacted our pharmacist, Anola Gurney, to have her check for any possible drug interactions with everolimus and lamisil.  The pharmacist stated that lamisil was a weak/moderate inducer of CYP3A.  The protocol was searched for lamisil (terbinafine), and it was not listed as a prohibited medication.  The research nurse contacted the study monitor, Reine Just, about any known drug interaction with Lamisil.  He forwarded the research nurse's email to his team, and Angus Palms responded that it was okay to use Lamisil with caution.  She also confirmed that no dose modifications were needed at this time.  The study team was relieved that her foot rash was not related to her study drug.  Dr. Kevan Ny was called and told that it was okay for her prescribe this medication.  Dr. Kevan Ny also faxed her dictated note.  The research nurse took the note to Dr. Welton Flakes for review.  Dr. Kevan Ny requested Dr. Milta Deiters latest office notes.  The MD said that she has not been receiving any office notes from the [facility] since we went on [facility].  The research nurse contacted Selena Batten in medical records about Dr. Judithann Sauger lack of office notes.  Selena Batten said that she would fax Dr. Kevan Ny the most recent office notes for her review.

## 2012-12-10 NOTE — Telephone Encounter (Signed)
Fax office notes to Dr.Donna [facility] office.

## 2012-12-29 ENCOUNTER — Encounter (HOSPITAL_COMMUNITY): Payer: Self-pay

## 2012-12-29 ENCOUNTER — Ambulatory Visit (HOSPITAL_COMMUNITY)
Admission: RE | Admit: 2012-12-29 | Discharge: 2012-12-29 | Disposition: A | Discharge status: Home / Self Care | Payer: Self-pay | Source: Ambulatory Visit | Attending: Oncology | Admitting: Oncology

## 2012-12-29 DIAGNOSIS — K5710 Diverticulosis of small intestine without perforation or abscess without bleeding: Secondary | ICD-10-CM | POA: Insufficient documentation

## 2012-12-29 DIAGNOSIS — K5900 Constipation, unspecified: Secondary | ICD-10-CM | POA: Insufficient documentation

## 2012-12-29 DIAGNOSIS — K7689 Other specified diseases of liver: Secondary | ICD-10-CM | POA: Insufficient documentation

## 2012-12-29 DIAGNOSIS — M899 Disorder of bone, unspecified: Secondary | ICD-10-CM | POA: Insufficient documentation

## 2012-12-29 DIAGNOSIS — C50919 Malignant neoplasm of unspecified site of unspecified female breast: Secondary | ICD-10-CM | POA: Insufficient documentation

## 2012-12-29 MED ORDER — IOHEXOL 300 MG/ML  SOLN
50.0000 mL | Freq: Once | INTRAMUSCULAR | Status: AC | PRN
  Administered 2012-12-29: 50 mL via ORAL

## 2012-12-29 MED ORDER — IOHEXOL 300 MG/ML  SOLN
100.0000 mL | Freq: Once | INTRAMUSCULAR | Status: AC | PRN
  Administered 2012-12-29: 100 mL via INTRAVENOUS

## 2012-12-31 ENCOUNTER — Encounter: Payer: Self-pay | Admitting: Oncology

## 2012-12-31 ENCOUNTER — Other Ambulatory Visit (HOSPITAL_BASED_OUTPATIENT_CLINIC_OR_DEPARTMENT_OTHER): Payer: Self-pay | Admitting: Lab

## 2012-12-31 ENCOUNTER — Encounter: Payer: Self-pay | Admitting: *Deleted

## 2012-12-31 ENCOUNTER — Ambulatory Visit (HOSPITAL_BASED_OUTPATIENT_CLINIC_OR_DEPARTMENT_OTHER): Payer: Self-pay | Admitting: Oncology

## 2012-12-31 ENCOUNTER — Telehealth: Payer: Self-pay | Admitting: Oncology

## 2012-12-31 ENCOUNTER — Other Ambulatory Visit: Payer: Self-pay | Admitting: *Deleted

## 2012-12-31 VITALS — BP 114/78 | HR 116 | Temp 97.5°F | Resp 20 | Ht 65.0 in | Wt 186.0 lb

## 2012-12-31 DIAGNOSIS — J90 Pleural effusion, not elsewhere classified: Secondary | ICD-10-CM

## 2012-12-31 DIAGNOSIS — C7951 Secondary malignant neoplasm of bone: Secondary | ICD-10-CM

## 2012-12-31 DIAGNOSIS — C7952 Secondary malignant neoplasm of bone marrow: Secondary | ICD-10-CM

## 2012-12-31 DIAGNOSIS — C50919 Malignant neoplasm of unspecified site of unspecified female breast: Secondary | ICD-10-CM

## 2012-12-31 DIAGNOSIS — C7800 Secondary malignant neoplasm of unspecified lung: Secondary | ICD-10-CM

## 2012-12-31 DIAGNOSIS — C50419 Malignant neoplasm of upper-outer quadrant of unspecified female breast: Secondary | ICD-10-CM

## 2012-12-31 DIAGNOSIS — I313 Pericardial effusion (noninflammatory): Secondary | ICD-10-CM

## 2012-12-31 DIAGNOSIS — R5381 Other malaise: Secondary | ICD-10-CM

## 2012-12-31 LAB — CBC WITH DIFFERENTIAL/PLATELET
Basophils Absolute: 0 10*3/uL (ref 0.0–0.1)
Eosinophils Absolute: 0.2 10*3/uL (ref 0.0–0.5)
HCT: 41.3 % (ref 34.8–46.6)
LYMPH%: 15.5 % (ref 14.0–49.7)
MCV: 80 fL (ref 79.5–101.0)
MONO#: 0.8 10*3/uL (ref 0.1–0.9)
NEUT#: 6.4 10*3/uL (ref 1.5–6.5)
NEUT%: 72.3 % (ref 38.4–76.8)
Platelets: 325 10*3/uL (ref 145–400)
WBC: 8.8 10*3/uL (ref 3.9–10.3)

## 2012-12-31 LAB — COMPREHENSIVE METABOLIC PANEL (CC13)
Albumin: 3.4 g/dL — ABNORMAL LOW (ref 3.5–5.0)
Alkaline Phosphatase: 187 U/L — ABNORMAL HIGH (ref 40–150)
BUN: 17.3 mg/dL (ref 7.0–26.0)
Creatinine: 0.9 mg/dL (ref 0.6–1.1)
Glucose: 127 mg/dl — ABNORMAL HIGH (ref 70–99)
Potassium: 4.5 mEq/L (ref 3.5–5.1)
Total Bilirubin: 0.32 mg/dL (ref 0.20–1.20)

## 2012-12-31 LAB — BILIRUBIN, DIRECT: Bilirubin, Direct: <0.1 mg/dL (ref 0.0–0.3)

## 2012-12-31 LAB — LIPID PANEL
Cholesterol: 201 mg/dL — ABNORMAL HIGH (ref 0–200)
HDL: 57 mg/dL (ref >39)
Total CHOL/HDL Ratio: 3.5 Ratio
Triglycerides: 212 mg/dL — ABNORMAL HIGH (ref <150)

## 2012-12-31 MED ORDER — INV-EVEROLIMUS (RAD0001) 5MG TABLET NOVARTIS CRAD001Y24135: 2.0000 | ORAL_TABLET | Freq: Every day | ORAL | Status: DC

## 2012-12-31 NOTE — Progress Notes (Signed)
12/31/12 at 1:39pm - Cycle 17, day 1 study notes - The pt was into cancer center this am for her cycle 17, day 1 assessments.  She confirmed that she was fasting overnight and this morning for her research labs.  The pt verified that she took her study drugs (everolimus and letrozole) everyday from 12/03/12  through 12/30/12 (28 days).  She returned her cycle 16 everolimus drug box with 14 remaining unopened blister packs of everolimus.  The pt took 2 doses of everolimus daily for 28 days.  Therefore, she took 56 pills.  She was dispensed 70 pills on 12/03/12 (70-56=14).  Therefore, the pt was 100% compliant with her study drug, everolimus.  The pt's vitals were taken.  Her BP was taken 3 times, and the mean will be recorded in the [facility] for study purposes.   The pt's labs were reviewed by Dr. Welton Flakes, and the MD did not feel that her lab abnormals were clinically significant.  The pt reports that she has been doing well with no new adverse events.  She states that she has been very worried about her dog who was recently diagnosed with cancer.  The pt was given emotional support.  The pt reports that she is able to do most of her usual activities.  ECOG=1.  She states that her "athlete's foot" has almost resolved.  She said that she is still using her prescribed medication for the athletes foot.  She reports that she started her oral lamisil and the topical econazole on 12/13/12.  She said that she stopped the lotrimin on 12/09/12.  She denies any other medication changes.  She reports the following adverse events as ongoing:  hot flashes, memory impairment, nausea, diarrhea, arthritis in hands, bilateral cataracts, fatigue, and ankle edema.  The pt's restaging scans were reviewed by Dr. Welton Flakes.  She felt that the pt's scans revealed "stable" disease.  The [employer] team was notified on 12/30/12 that the pt actually met "progression" based solely on the RECIST numbers.  They felt that the investigator needed to make the decision based  on the clinical assessment and radiological assessment.  Dr. Welton Flakes wanted the pt to continue on her current treatment until there is a definite progression.  The pt was dispensed her cycle 17 everolimus drug box for self administration.  The pt was instructed to continue taking her everolimus (2 pills - 10 mg daily dose) and her letrozole daily.  The pt verbalized understanding.  She is aware of her cycle 18 appointments.

## 2013-01-07 ENCOUNTER — Other Ambulatory Visit: Payer: Self-pay | Admitting: Emergency Medicine

## 2013-01-07 MED ORDER — GEMFIBROZIL 600 MG PO TABS: 600.0000 mg | ORAL_TABLET | Freq: Two times a day (BID) | ORAL | Status: DC

## 2013-01-24 NOTE — Progress Notes (Signed)
OFFICE PROGRESS NOTE  CC  Barbara Espy, MD [address] [place] [address]  DIAGNOSIS: 62 year old female with metastatic breast cancer with bone metastasis and lung metastasis.  PRIOR THERAPY:  #1 patient originally presented when she was 62 years old with early stage breast cancer in 2005. At that time she underwent a lumpectomy with axillary lymph node dissection. She was followed by Dr. Pierce Crane and Theron Arista young. Her last visit to Dr. Renelda Loma office was in 2006. Because of difficulties with insurance she had not been receiving mammograms.  #2 in 2012 patient began having shortness of breath on exertion. She was seen by pulmonary service and a CT scan showed some minor mediastinal adenopathy as well as borderline lower lobe nodule. PET scan was not performed. October 2012 patient began to experience more shortness of breath. Initially she was told that she had pneumonia. However subsequently she developed lower extremity edema. CT of the chest was obtained that showed a large pleural effusion in the right side as well as pericardial effusion. She was seen by cardiothoracic surgery placed in 09/11/2011. The cytology revealed that patient indeed have recurrent breast cancer that was hormone receptor positive HER-2/neu was not obtained as it was insufficient tissue. She subsequently had a CT scan performed that showed 2 nodules in the lung suspicious for metastatic disease as well as left lower lobe collapse. She had a biopsy of the lungs performed on 09/11/2011 the tumor was estrogen receptor +91% progesterone receptor +100% proliferation marker Ki-67 35%.  #3 patient was seen by Dr. Pierce Crane and he recommended that she be enrolled on the BOLERO-4 clinical trial utilizing letrozole 2.5 mg per day and affinitor10 mg per day. She initiated this on 10/10/2011.she has had a significant response to this lower extremity edema has reduced significantly in size she is no longer short  of breath.  CURRENT THERAPY:BOLERO -4 with everolimus 10 mg and letrozole 2.5 mg daily. Patient will begin cycle 17 day 1 today. ( 28 day cycles)  INTERVAL HISTORY: Barbara Parks 62 y.o. female returns for followup visit prior to cycle 17.  Her main complaints include fatigue, and the arthritis in her fingers.  The hot flashes, forgetfulness, nausea, diarrhea, and cataracts are stable.  Her headaches have resolved.  She is taking the Everolimus and Letrozole daily and doing well. Patient is complaining of swelling in the left ankle and hands this may be due to the letrozole. However especially in the lower extremity I would like to do a Doppler ultrasound I will order this. Otherwise a 10 point ROS is neg.   MEDICAL HISTORY: Past Medical History  Diagnosis Date  . Asthma   . Shortness of breath   . Headache(784.0)     OTC Prilosec  . Arthritis     left hip  . Contact dermatitis     from a bandaid that had Latex   . breast ca 2005    breast/chemo R mastectomy  . Metastasis to lung dx'd 08/2011    ALLERGIES:  is allergic to aspirin; latex; and nsaids.  MEDICATIONS:  Current Outpatient Prescriptions  Medication Sig Dispense Refill  . cholecalciferol (VITAMIN D) 1000 UNITS tablet Take 2,000 Units by mouth daily.       Marland Kitchen econazole nitrate 1 % cream Apply 1 application topically 2 (two) times daily.      Marland Kitchen ibuprofen (ADVIL,MOTRIN) 200 MG tablet Take 400 mg by mouth every 6 (six) hours as needed. For pain      .  Investigational everolimus (RAD001) 5 MG tablet [employer] [id number] Take 2 tablets by mouth daily. Take with a glass of water.  70 tablet  0  . letrozole (FEMARA) 2.5 MG tablet Take 1 tablet (2.5 mg total) by mouth daily.  90 tablet  6  . loperamide (IMODIUM) 2 MG capsule Take 2 mg by mouth 4 (four) times daily as needed for diarrhea or loose stools.      . metoprolol tartrate (LOPRESSOR) 25 MG tablet Take 0.5 tablets (12.5 mg total) by mouth daily.  30 tablet  4  .  prochlorperazine (COMPAZINE) 10 MG tablet Take 10 mg by mouth every 6 (six) hours as needed. For nausea      . terbinafine (LAMISIL) 250 MG tablet Take 250 mg by mouth daily. Patient to take for 30 days      . gemfibrozil (LOPID) 600 MG tablet Take 1 tablet (600 mg total) by mouth 2 (two) times daily before a meal.  60 tablet  1  . Investigational everolimus (RAD001) 5 MG tablet [employer] [id number] Take 2 tablets by mouth daily. Take with a glass of water.  70 tablet  0   No current facility-administered medications for this visit.    SURGICAL HISTORY:  Past Surgical History  Procedure Laterality Date  . Spine surgery  1996  . Breast surgery  2005    right  . Video bronchoscopy  09/11/2011    Procedure: VIDEO BRONCHOSCOPY;  Surgeon: Alleen Borne, MD;  Location: [facility] OR;  Service: Thoracic;  Laterality: N/A;  . Chest tube insertion  09/11/2011    Procedure: INSERTION PLEURAL DRAINAGE CATHETER;  Surgeon: Alleen Borne, MD;  Location: [facility] OR;  Service: Thoracic;  Laterality: Right;  . Pericardial window  09/11/2011    Procedure: PERICARDIAL WINDOW;  Surgeon: Alleen Borne, MD;  Location: [facility] OR;  Service: Thoracic;  Laterality: N/A;  . Removal of pleural drainage catheter  12/19/2011    Procedure: REMOVAL OF PLEURAL DRAINAGE CATHETER;  Surgeon: Alleen Borne, MD;  Location: [facility] OR;  Service: Thoracic;  Laterality: Right;  TO BE DONE IN MINOR ROOM, SHORT STAY    REVIEW OF SYSTEMS:  General: fatigue (+), night sweats (-), fever (-), pain (-) Lymph: palpable nodes (-) HEENT: vision changes (-), mucositis (-), gum bleeding (-), epistaxis (-) Cardiovascular: chest pain (-), palpitations (-) Pulmonary: shortness of breath (-), dyspnea on exertion (-), cough (-), hemoptysis (-) GI:  Early satiety (-), melena (-), dysphagia (-), nausea/vomiting (-), diarrhea (-) GU: dysuria (-), hematuria (-), incontinence (-) Musculoskeletal: joint swelling (-), joint pain (+), back pain (-) Neuro: weakness (-),  numbness (-), headache (-), confusion (-) Skin: Rash (-), lesions (-), dryness (-) Psych: depression (-), suicidal/homicidal ideation (-), feeling of hopelessness (-)   PHYSICAL EXAMINATION: Blood pressure 114/78, pulse 116, temperature 97.5 F (36.4 C), temperature source Oral, resp. rate 20, height 5\' 5"  (1.651 m), weight 186 lb (84.369 kg). Body mass index is 30.95 kg/(m^2). General: Patient is a well appearing female in no acute distress HEENT: PERRLA, sclerae anicteric no conjunctival pallor, MMM Neck: supple, no palpable adenopathy Lungs: clear to auscultation bilaterally, no wheezes, rhonchi, or rales Cardiovascular: regular rate rhythm, S1, S2, no murmurs, rubs or gallops Abdomen: Soft, non-tender, non-distended, normoactive bowel sounds, no HSM Extremities: warm and well perfused, no clubbing, cyanosis, or edema Skin: No rashes or lesions Neuro: Non-focal ECOG PERFORMANCE STATUS: 1 - Symptomatic but completely ambulatory    LABORATORY DATA: Lab Results  Component Value Date  WBC 8.8 12/31/2012   HGB 13.5 12/31/2012   HCT 41.3 12/31/2012   MCV 80.0 12/31/2012   PLT 325 12/31/2012      Chemistry      Component Value Date/Time   NA 144 12/31/2012 0940   NA 136 07/16/2012 0910   K 4.5 12/31/2012 0940   K 4.0 07/16/2012 0910   CL 109* 12/31/2012 0940   CL 103 07/16/2012 0910   CO2 24 12/31/2012 0940   CO2 21 07/16/2012 0910   BUN 17.3 12/31/2012 0940   BUN 16 07/16/2012 0910   CREATININE 0.9 12/31/2012 0940   CREATININE 0.75 07/16/2012 0910      Component Value Date/Time   CALCIUM 10.3 12/31/2012 0940   CALCIUM 10.1 07/16/2012 0910   ALKPHOS 187* 12/31/2012 0940   ALKPHOS 142* 07/16/2012 0910   AST 29 12/31/2012 0940   AST 30 07/16/2012 0910   ALT 29 12/31/2012 0940   ALT 33 07/16/2012 0910   BILITOT 0.32 12/31/2012 0940   BILITOT 0.2* 07/16/2012 0910       RADIOGRAPHIC STUDIES: CT CHEST, ABDOMEN AND PELVIS WITH CONTRAST  Technique: Contiguous axial images of the chest abdomen  and pelvis  were obtained after IV contrast administration.  Contrast: 100 ml Omnipaque-300  Comparison: 11/03/2012  CT CHEST  RECIST protocol 1.0  Target Lesions:  1.Right lower lobe lung "nodule" 5 mm on image 28/series 5 versus 6  mm on the prior.  2. Subcarinal lymph node 9 mm short axis on image 28/series 2  versus 8 mm on the prior.  3. Right hilar node 9 mm on image 26/series 2 versus similar on  the prior exam (when remeasured).  Non Target lesions:  1. Findings suspicious for osseous metastasis. Similar.  Findings: Lung windows demonstrate mild scarring at the right lung  base. Clear left lung.  Soft tissue windows demonstrate no supraclavicular adenopathy.  Right mastectomy and axillary nodal dissection. No axillary  adenopathy. Tortuous descending thoracic aorta. Heart size upper  normal, without pericardial effusion. Trace right-sided pleural  thickening is unchanged. No central pulmonary embolism, on this non-  dedicated study. No mediastinal or hilar adenopathy. No internal  mammary adenopathy.  A sclerotic lesion involving the left posterior 11th rib is similar  on image 45/series 2. This is absent on 08/09/2010.  IMPRESSION:  1. Similar eleventh posterior left rib sclerosis, suspicious for  osseous metastasis.  2. No evidence of extra osseous metastasis within the chest.  CT ABDOMEN AND PELVIS  Findings: Moderate to marked hepatic steatosis. Similar vague  hyperenhancement the right lobe liver on image 52/series 2, likely  perfusion anomaly. There is sparing of steatosis adjacent the  gallbladder. Normal spleen, stomach. Duodenal diverticulum  involves the transverse segment. Normal pancreas, gallbladder,  biliary tract, right gland, kidneys. Minimal left adrenal  nodularity is unchanged. No retroperitoneal or retrocrural  adenopathy.  Normal colon, appendix, and terminal ileum. Normal small bowel  without abdominal ascites.  No evidence of omental or  peritoneal disease.  No pelvic adenopathy. Normal urinary bladder and uterus. No  adnexal mass. No significant free fluid.  Advanced left hip osteoarthritis. Vague sclerosis involving the  left iliac crest is unchanged. A well-defined sclerotic lesion in  the right iliac crest is similar. Subtle sclerosis in the anterior  left sacrum on image 94 is similar.  IMPRESSION:  1. Similar subtle osseous sclerosis, for which metastatic disease  cannot be excluded.  2. No extra osseous metastasis within the abdomen/pelvis.  3. Moderate to  marked hepatic steatosis.      ASSESSMENT: 62 year old female with  #1 original diagnosis of breast cancer in 2005. She was treated with a lumpectomy followed by radiation. She apparently did go on antiestrogen therapy. Thereafter she was lost to follow up due to lack of finances and loss of insurance.    2 patient was seen by Dr. Donnie Coffin in March 2013 with metastatic recurrent breast cancer that was ER positive PR positive with lung metastasis malignant pleural effusion and malignant pericardial effusion.she underwent pericardial window as well as Pleurx catheter placement.  #3 she was then begun on BOLERO 4 clinical trial consisting of everolimus 10 mg and letrozole 2.5 mg given on a daily basis on a 28 day cycle. So far she has completed 12 cycles. She is here for cycle number 13.   #4patient had CT scans performed on 12/29/2012.the scan does not show any progressive disease and clinically patient seems to be doing quite nicely.    PLAN:   #1 patient will proceed with cycle 17 of her chemotherapy consisting of everolimus and letrozole.  #2 She will return in 4 weeks' time for followup  All questions were answered. The patient knows to call the clinic with any problems, questions or concerns. We can certainly see the patient much sooner if necessary.  I spent 25 minutes counseling the patient face to face. The total time spent in the appointment was 25  minutes.  Drue Second, MD Medical/Oncology [facility] [phone] (beeper) [phone] (Office)

## 2013-01-28 ENCOUNTER — Other Ambulatory Visit: Payer: Self-pay | Admitting: *Deleted

## 2013-01-28 ENCOUNTER — Encounter: Payer: Self-pay | Admitting: Oncology

## 2013-01-28 ENCOUNTER — Encounter: Payer: Self-pay | Admitting: *Deleted

## 2013-01-28 ENCOUNTER — Telehealth: Payer: Self-pay | Admitting: Oncology

## 2013-01-28 ENCOUNTER — Other Ambulatory Visit (HOSPITAL_BASED_OUTPATIENT_CLINIC_OR_DEPARTMENT_OTHER): Payer: Self-pay | Admitting: Lab

## 2013-01-28 ENCOUNTER — Ambulatory Visit (HOSPITAL_BASED_OUTPATIENT_CLINIC_OR_DEPARTMENT_OTHER): Payer: Self-pay | Admitting: Oncology

## 2013-01-28 VITALS — BP 122/84 | HR 106 | Temp 97.6°F | Resp 20 | Ht 65.0 in | Wt 183.8 lb

## 2013-01-28 DIAGNOSIS — C7952 Secondary malignant neoplasm of bone marrow: Secondary | ICD-10-CM

## 2013-01-28 DIAGNOSIS — I313 Pericardial effusion (noninflammatory): Secondary | ICD-10-CM

## 2013-01-28 DIAGNOSIS — C50919 Malignant neoplasm of unspecified site of unspecified female breast: Secondary | ICD-10-CM

## 2013-01-28 DIAGNOSIS — J90 Pleural effusion, not elsewhere classified: Secondary | ICD-10-CM

## 2013-01-28 DIAGNOSIS — C7800 Secondary malignant neoplasm of unspecified lung: Secondary | ICD-10-CM

## 2013-01-28 DIAGNOSIS — C50419 Malignant neoplasm of upper-outer quadrant of unspecified female breast: Secondary | ICD-10-CM

## 2013-01-28 DIAGNOSIS — C7951 Secondary malignant neoplasm of bone: Secondary | ICD-10-CM

## 2013-01-28 LAB — COMPREHENSIVE METABOLIC PANEL (CC13)
Albumin: 3.6 g/dL (ref 3.5–5.0)
BUN: 18.4 mg/dL (ref 7.0–26.0)
Calcium: 10.4 mg/dL (ref 8.4–10.4)
Chloride: 106 mEq/L (ref 98–109)
Glucose: 119 mg/dl (ref 70–140)
Potassium: 4.1 mEq/L (ref 3.5–5.1)

## 2013-01-28 LAB — CBC WITH DIFFERENTIAL/PLATELET
Eosinophils Absolute: 0.2 10*3/uL (ref 0.0–0.5)
LYMPH%: 15.4 % (ref 14.0–49.7)
MONO#: 0.7 10*3/uL (ref 0.1–0.9)
NEUT#: 5.8 10*3/uL (ref 1.5–6.5)
Platelets: 325 10*3/uL (ref 145–400)
RBC: 5.46 10*6/uL — ABNORMAL HIGH (ref 3.70–5.45)
WBC: 7.9 10*3/uL (ref 3.9–10.3)
nRBC: 0 % (ref 0–0)

## 2013-01-28 LAB — CK: Total CK: 69 U/L (ref 7–177)

## 2013-01-28 LAB — LIPID PANEL
Cholesterol: 234 mg/dL — ABNORMAL HIGH (ref 0–200)
Triglycerides: 248 mg/dL — ABNORMAL HIGH (ref <150)

## 2013-01-28 MED ORDER — INV-EVEROLIMUS (RAD0001) 5MG TABLET NOVARTIS CRAD001Y24135: 2.0000 | ORAL_TABLET | Freq: Every day | ORAL | Status: DC

## 2013-01-28 NOTE — Progress Notes (Signed)
01/28/13 at 11:43am - [employer] Bolero 4- cycle 18, day 1 study notes- The pt is into the clinic this morning for her cycle 18 study assessments.  The pt confirmed that she was fasting overnight upon arrival to the cancer center this am for her blood work.  She returned her cycle 17 everolimus drug box.  The pt had taken 56 pills and 14 pills were returned.  The pt confirmed that she has taken her everolimus along with her letrozole daily from 12/31/12 through 01/27/13 (28 days).  Therefore, the pt has been 100% compliant with her study drug (70 dispensed - 56 taken = 14 returned ).  The pt's vitals were taken today.  Her mean BP was calculated to be 124/81.  Her weight was stable.  The pt was seen and examined today by Dr. Welton Flakes.  Dr. Welton Flakes reviewed her lab values, and the MD felt that her lab abnormals were not "clinically significant".  The pt denied any new adverse events.  The pt specifically denies any mouth sores.  The pt reports the following AE's as ongoing:  hot flashes, memory impairment, intermittent nausea and diarrhea, arthritis in hands, bilateral cataracts, fatigue, intermittent lower extremity edema (no edema noted today), and athlete's foot (bilateral).  The MD was pleased with the pt's overall status today, and the MD wanted the pt to remain on her current regimen without any modifications.  The pt was dispensed her next everolimus study drug box for cycle 18.  The pt was instructed to continue taking 2 pills of everolimus daily along with her letrozole 2.5mg  pill.  The pt confirmed that she has enough letrozole for the next 3 months.  The pt's concomitant medication log was reviewed with the pt.  The pt confirmed that the following medications are ongoing;  Lopressor, compazine prn, immodium prn, lopid, ibuprofen, vitamin D3, and econazole cream.  The pt reported that she discontinued her Lamisil on 01/15/13.  The pt reports some limitations in her activities,but she is able to perform most of her usual  activities.  ECOG=1.  The pt is aware of her cycle 19 appointments.  She is also aware that restaging scans will be done prior to her next cycle.

## 2013-01-28 NOTE — Progress Notes (Signed)
OFFICE PROGRESS NOTE  CC  [name], MD [address] [place] [address]  DIAGNOSIS: 62 year old female with metastatic breast cancer with bone metastasis and lung metastasis.  PRIOR THERAPY:  #1 patient originally presented when she was 62 years old with early stage breast cancer in 2005. At that time she underwent a lumpectomy with axillary lymph node dissection. She was followed by Dr. Pierce Crane and Theron Arista young. Her last visit to Dr. Renelda Loma office was in 2006. Because of difficulties with insurance she had not been receiving mammograms.  #2 in 2012 patient began having shortness of breath on exertion. She was seen by pulmonary service and a CT scan showed some minor mediastinal adenopathy as well as borderline lower lobe nodule. PET scan was not performed. October 2012 patient began to experience more shortness of breath. Initially she was told that she had pneumonia. However subsequently she developed lower extremity edema. CT of the chest was obtained that showed a large pleural effusion in the right side as well as pericardial effusion. She was seen by cardiothoracic surgery placed in 09/11/2011. The cytology revealed that patient indeed have recurrent breast cancer that was hormone receptor positive HER-2/neu was not obtained as it was insufficient tissue. She subsequently had a CT scan performed that showed 2 nodules in the lung suspicious for metastatic disease as well as left lower lobe collapse. She had a biopsy of the lungs performed on 09/11/2011 the tumor was estrogen receptor +91% progesterone receptor +100% proliferation marker Ki-67 35%.  #3 patient was seen by Dr. Pierce Crane and he recommended that she be enrolled on the BOLERO-4 clinical trial utilizing letrozole 2.5 mg per day and affinitor10 mg per day. She initiated this on 10/10/2011.she has had a significant response to this lower extremity edema has reduced significantly in size she is no longer short  of breath.  CURRENT THERAPY:BOLERO -4 with everolimus 10 mg and letrozole 2.5 mg daily. Patient will begin cycle 18 day 1 today. ( 28 day cycles)  INTERVAL HISTORY: Barbara Parks 62 y.o. female returns for followup visit prior to cycle 18.  Her main complaints include fatigue, and the arthritis in her fingers.  The hot flashes, forgetfulness, nausea, diarrhea, and cataracts are stable.  Her headaches have resolved.  She is taking the Everolimus and Letrozole daily and doing well. Patient is complaining of swelling in the left ankle and hands this may be due to the letrozole. However especially in the lower extremity I would like to do a Doppler ultrasound I will order this. Otherwise a 10 point ROS is neg.   MEDICAL HISTORY: Past Medical History  Diagnosis Date  . Asthma   . Shortness of breath   . Headache(784.0)     OTC Prilosec  . Arthritis     left hip  . Contact dermatitis     from a bandaid that had Latex   . breast ca 2005    breast/chemo R mastectomy  . Metastasis to lung dx'd 08/2011    ALLERGIES:  is allergic to aspirin; latex; and nsaids.  MEDICATIONS:  Current Outpatient Prescriptions  Medication Sig Dispense Refill  . cholecalciferol (VITAMIN D) 1000 UNITS tablet Take 2,000 Units by mouth daily.       Marland Kitchen econazole nitrate 1 % cream Apply 1 application topically 2 (two) times daily.      Marland Kitchen gemfibrozil (LOPID) 600 MG tablet Take 1 tablet (600 mg total) by mouth 2 (two) times daily before a meal.  60 tablet  1  . ibuprofen (ADVIL,MOTRIN) 200 MG tablet Take 400 mg by mouth every 6 (six) hours as needed. For pain      . Investigational everolimus (RAD001) 5 MG tablet [employer] [id number] Take 2 tablets by mouth daily. Take with a glass of water.  70 tablet  0  . letrozole (FEMARA) 2.5 MG tablet Take 1 tablet (2.5 mg total) by mouth daily.  90 tablet  6  . loperamide (IMODIUM) 2 MG capsule Take 2 mg by mouth 4 (four) times daily as needed for diarrhea or loose stools.       . metoprolol tartrate (LOPRESSOR) 25 MG tablet Take 0.5 tablets (12.5 mg total) by mouth daily.  30 tablet  4  . prochlorperazine (COMPAZINE) 10 MG tablet Take 10 mg by mouth every 6 (six) hours as needed. For nausea       No current facility-administered medications for this visit.    SURGICAL HISTORY:  Past Surgical History  Procedure Laterality Date  . Spine surgery  1996  . Breast surgery  2005    right  . Video bronchoscopy  09/11/2011    Procedure: VIDEO BRONCHOSCOPY;  Surgeon: Alleen Borne, MD;  Location: [facility] OR;  Service: Thoracic;  Laterality: N/A;  . Chest tube insertion  09/11/2011    Procedure: INSERTION PLEURAL DRAINAGE CATHETER;  Surgeon: Alleen Borne, MD;  Location: [facility] OR;  Service: Thoracic;  Laterality: Right;  . Pericardial window  09/11/2011    Procedure: PERICARDIAL WINDOW;  Surgeon: Alleen Borne, MD;  Location: [facility] OR;  Service: Thoracic;  Laterality: N/A;  . Removal of pleural drainage catheter  12/19/2011    Procedure: REMOVAL OF PLEURAL DRAINAGE CATHETER;  Surgeon: Alleen Borne, MD;  Location: [facility] OR;  Service: Thoracic;  Laterality: Right;  TO BE DONE IN MINOR ROOM, SHORT STAY    REVIEW OF SYSTEMS:  General: fatigue (+), night sweats (-), fever (-), pain (-) Lymph: palpable nodes (-) HEENT: vision changes (-), mucositis (-), gum bleeding (-), epistaxis (-) Cardiovascular: chest pain (-), palpitations (-) Pulmonary: shortness of breath (-), dyspnea on exertion (-), cough (-), hemoptysis (-) GI:  Early satiety (-), melena (-), dysphagia (-), nausea/vomiting (-), diarrhea (-) GU: dysuria (-), hematuria (-), incontinence (-) Musculoskeletal: joint swelling (-), joint pain (+), back pain (-) Neuro: weakness (-), numbness (-), headache (-), confusion (-) Skin: Rash (-), lesions (-), dryness (-) Psych: depression (-), suicidal/homicidal ideation (-), feeling of hopelessness (-)   PHYSICAL EXAMINATION: Blood pressure 122/84, pulse 106, temperature 97.6 F (36.4  C), temperature source Oral, resp. rate 20, height 5\' 5"  (1.651 m), weight 183 lb 12.8 oz (83.371 kg). Body mass index is 30.59 kg/(m^2). General: Patient is a well appearing female in no acute distress HEENT: PERRLA, sclerae anicteric no conjunctival pallor, MMM Neck: supple, no palpable adenopathy Lungs: clear to auscultation bilaterally, no wheezes, rhonchi, or rales Cardiovascular: regular rate rhythm, S1, S2, no murmurs, rubs or gallops Abdomen: Soft, non-tender, non-distended, normoactive bowel sounds, no HSM Extremities: warm and well perfused, no clubbing, cyanosis, or edema Skin: No rashes or lesions Neuro: Non-focal ECOG PERFORMANCE STATUS: 1 - Symptomatic but completely ambulatory    LABORATORY DATA: Lab Results  Component Value Date   WBC 7.9 01/28/2013   HGB 14.4 01/28/2013   HCT 43.6 01/28/2013   MCV 79.9 01/28/2013   PLT 325 01/28/2013      Chemistry      Component Value Date/Time   NA 139 01/28/2013 1015  NA 136 07/16/2012 0910   K 4.1 01/28/2013 1015   K 4.0 07/16/2012 0910   CL 109* 12/31/2012 0940   CL 103 07/16/2012 0910   CO2 23 01/28/2013 1015   CO2 21 07/16/2012 0910   BUN 18.4 01/28/2013 1015   BUN 16 07/16/2012 0910   CREATININE 0.8 01/28/2013 1015   CREATININE 0.75 07/16/2012 0910      Component Value Date/Time   CALCIUM 10.4 01/28/2013 1015   CALCIUM 10.1 07/16/2012 0910   ALKPHOS 191* 01/28/2013 1015   ALKPHOS 142* 07/16/2012 0910   AST 34 01/28/2013 1015   AST 30 07/16/2012 0910   ALT 43 01/28/2013 1015   ALT 33 07/16/2012 0910   BILITOT 0.28 01/28/2013 1015   BILITOT 0.2* 07/16/2012 0910       RADIOGRAPHIC STUDIES: CT CHEST, ABDOMEN AND PELVIS WITH CONTRAST  Technique: Contiguous axial images of the chest abdomen and pelvis  were obtained after IV contrast administration.  Contrast: 100 ml Omnipaque-300  Comparison: 11/03/2012  CT CHEST  RECIST protocol 1.0  Target Lesions:  1.Right lower lobe lung "nodule" 5 mm on image 28/series 5 versus 6  mm on the  prior.  2. Subcarinal lymph node 9 mm short axis on image 28/series 2  versus 8 mm on the prior.  3. Right hilar node 9 mm on image 26/series 2 versus similar on  the prior exam (when remeasured).  Non Target lesions:  1. Findings suspicious for osseous metastasis. Similar.  Findings: Lung windows demonstrate mild scarring at the right lung  base. Clear left lung.  Soft tissue windows demonstrate no supraclavicular adenopathy.  Right mastectomy and axillary nodal dissection. No axillary  adenopathy. Tortuous descending thoracic aorta. Heart size upper  normal, without pericardial effusion. Trace right-sided pleural  thickening is unchanged. No central pulmonary embolism, on this non-  dedicated study. No mediastinal or hilar adenopathy. No internal  mammary adenopathy.  A sclerotic lesion involving the left posterior 11th rib is similar  on image 45/series 2. This is absent on 08/09/2010.  IMPRESSION:  1. Similar eleventh posterior left rib sclerosis, suspicious for  osseous metastasis.  2. No evidence of extra osseous metastasis within the chest.  CT ABDOMEN AND PELVIS  Findings: Moderate to marked hepatic steatosis. Similar vague  hyperenhancement the right lobe liver on image 52/series 2, likely  perfusion anomaly. There is sparing of steatosis adjacent the  gallbladder. Normal spleen, stomach. Duodenal diverticulum  involves the transverse segment. Normal pancreas, gallbladder,  biliary tract, right gland, kidneys. Minimal left adrenal  nodularity is unchanged. No retroperitoneal or retrocrural  adenopathy.  Normal colon, appendix, and terminal ileum. Normal small bowel  without abdominal ascites.  No evidence of omental or peritoneal disease.  No pelvic adenopathy. Normal urinary bladder and uterus. No  adnexal mass. No significant free fluid.  Advanced left hip osteoarthritis. Vague sclerosis involving the  left iliac crest is unchanged. A well-defined sclerotic lesion in   the right iliac crest is similar. Subtle sclerosis in the anterior  left sacrum on image 94 is similar.  IMPRESSION:  1. Similar subtle osseous sclerosis, for which metastatic disease  cannot be excluded.  2. No extra osseous metastasis within the abdomen/pelvis.  3. Moderate to marked hepatic steatosis.      ASSESSMENT: 62 year old female with  #1 original diagnosis of breast cancer in 2005. She was treated with a lumpectomy followed by radiation. She apparently did go on antiestrogen therapy. Thereafter she was lost to follow up due  to lack of finances and loss of insurance.    2 patient was seen by Dr. Donnie Coffin in March 2013 with metastatic recurrent breast cancer that was ER positive PR positive with lung metastasis malignant pleural effusion and malignant pericardial effusion.she underwent pericardial window as well as Pleurx catheter placement.  #3 she was then begun on BOLERO 4 clinical trial consisting of everolimus 10 mg and letrozole 2.5 mg given on a daily basis on a 28 day cycle. So far she has completed 17 cycles. She is here for cycle number 18.   #4patient had CT scans performed on 12/29/2012.the scan does not show any progressive disease and clinically patient seems to be doing quite nicely. She will have her next scan on 02/23/2013.    PLAN:   #1 patient will proceed with cycle 18 of her chemotherapy consisting of everolimus and letrozole.  #2 She will return in 4 weeks' time for followup  All questions were answered. The patient knows to call the clinic with any problems, questions or concerns. We can certainly see the patient much sooner if necessary.  I spent 25 minutes counseling the patient face to face. The total time spent in the appointment was 25 minutes.  Drue Second, MD Medical/Oncology [facility] [phone] (beeper) [phone] (Office)

## 2013-01-28 NOTE — Telephone Encounter (Signed)
, °

## 2013-01-28 NOTE — Patient Instructions (Addendum)
#  1 you are doing extremely well clinically you have no evidence of progressive disease.  #2 we will proceed with cycle #18 of letrozole 2.5 mg daily with everolimus 10 mg daily.  #3 prior to your next visit on 02/23/2013 we will order CT scans of the chest abdomen and pelvis along with labs per protocol for BOLERO-4  clinical trial  #4 I will plan on seeing you back on 02/25/2013.

## 2013-02-23 ENCOUNTER — Encounter (HOSPITAL_COMMUNITY): Payer: Self-pay

## 2013-02-23 ENCOUNTER — Ambulatory Visit (HOSPITAL_COMMUNITY): Payer: Self-pay

## 2013-02-23 ENCOUNTER — Ambulatory Visit (HOSPITAL_COMMUNITY)
Admission: RE | Admit: 2013-02-23 | Discharge: 2013-02-23 | Disposition: A | Discharge status: Home / Self Care | Payer: Self-pay | Source: Ambulatory Visit | Attending: Oncology | Admitting: Oncology

## 2013-02-23 DIAGNOSIS — R911 Solitary pulmonary nodule: Secondary | ICD-10-CM | POA: Insufficient documentation

## 2013-02-23 DIAGNOSIS — Z9010 Acquired absence of unspecified breast and nipple: Secondary | ICD-10-CM | POA: Insufficient documentation

## 2013-02-23 DIAGNOSIS — C50919 Malignant neoplasm of unspecified site of unspecified female breast: Secondary | ICD-10-CM | POA: Insufficient documentation

## 2013-02-23 DIAGNOSIS — I700 Atherosclerosis of aorta: Secondary | ICD-10-CM | POA: Insufficient documentation

## 2013-02-23 DIAGNOSIS — M1610 Unilateral primary osteoarthritis, unspecified hip: Secondary | ICD-10-CM | POA: Insufficient documentation

## 2013-02-23 DIAGNOSIS — K7689 Other specified diseases of liver: Secondary | ICD-10-CM | POA: Insufficient documentation

## 2013-02-23 DIAGNOSIS — C7952 Secondary malignant neoplasm of bone marrow: Secondary | ICD-10-CM | POA: Insufficient documentation

## 2013-02-23 DIAGNOSIS — C7951 Secondary malignant neoplasm of bone: Secondary | ICD-10-CM | POA: Insufficient documentation

## 2013-02-23 DIAGNOSIS — K5730 Diverticulosis of large intestine without perforation or abscess without bleeding: Secondary | ICD-10-CM | POA: Insufficient documentation

## 2013-02-23 DIAGNOSIS — M169 Osteoarthritis of hip, unspecified: Secondary | ICD-10-CM | POA: Insufficient documentation

## 2013-02-23 MED ORDER — IOHEXOL 300 MG/ML  SOLN
50.0000 mL | Freq: Once | INTRAMUSCULAR | Status: AC | PRN
  Administered 2013-02-23: 50 mL via ORAL

## 2013-02-23 MED ORDER — IOHEXOL 300 MG/ML  SOLN
100.0000 mL | Freq: Once | INTRAMUSCULAR | Status: AC | PRN
  Administered 2013-02-23: 100 mL via INTRAVENOUS

## 2013-02-25 ENCOUNTER — Other Ambulatory Visit (HOSPITAL_BASED_OUTPATIENT_CLINIC_OR_DEPARTMENT_OTHER): Payer: Self-pay | Admitting: Lab

## 2013-02-25 ENCOUNTER — Ambulatory Visit: Payer: Self-pay | Admitting: Oncology

## 2013-02-25 ENCOUNTER — Other Ambulatory Visit: Payer: Self-pay | Admitting: Lab

## 2013-02-25 ENCOUNTER — Telehealth: Payer: Self-pay | Admitting: *Deleted

## 2013-02-25 ENCOUNTER — Telehealth: Payer: Self-pay | Admitting: Oncology

## 2013-02-25 ENCOUNTER — Encounter: Payer: Self-pay | Admitting: *Deleted

## 2013-02-25 DIAGNOSIS — C50919 Malignant neoplasm of unspecified site of unspecified female breast: Secondary | ICD-10-CM

## 2013-02-25 LAB — CBC WITH DIFFERENTIAL/PLATELET
Eosinophils Absolute: 0.2 10*3/uL (ref 0.0–0.5)
MONO#: 0.8 10*3/uL (ref 0.1–0.9)
MONO%: 11.3 % (ref 0.0–14.0)
NEUT#: 4.8 10*3/uL (ref 1.5–6.5)
RBC: 5.32 10*6/uL (ref 3.70–5.45)
RDW: 14.1 % (ref 11.2–14.5)
WBC: 6.8 10*3/uL (ref 3.9–10.3)

## 2013-02-25 LAB — COMPREHENSIVE METABOLIC PANEL (CC13)
ALT: 23 U/L (ref 0–55)
Albumin: 3.4 g/dL — ABNORMAL LOW (ref 3.5–5.0)
Alkaline Phosphatase: 182 U/L — ABNORMAL HIGH (ref 40–150)
CO2: 22 mEq/L (ref 22–29)
Glucose: 124 mg/dl (ref 70–140)
Potassium: 4.2 mEq/L (ref 3.5–5.1)
Sodium: 141 mEq/L (ref 136–145)
Total Protein: 8.1 g/dL (ref 6.4–8.3)

## 2013-02-25 LAB — LIPID PANEL
Cholesterol: 233 mg/dL — ABNORMAL HIGH (ref 0–200)
LDL Cholesterol: 128 mg/dL — ABNORMAL HIGH (ref 0–99)
VLDL: 58 mg/dL — ABNORMAL HIGH (ref 0–40)

## 2013-02-25 LAB — PHOSPHORUS: Phosphorus: 3.2 mg/dL (ref 2.3–4.6)

## 2013-02-25 LAB — CK: Total CK: 79 U/L (ref 7–177)

## 2013-02-25 NOTE — Progress Notes (Unsigned)
02/25/13 @ 4:45 pm, [employer] [id number], Cycle 19:  Barbara Parks came into the [facility] this morning to have fasting labs drawn per protocol.  Around 11:00am her car broke down and she had to have it towed back home to [place].  She called the scheduler, Barbara Parks, to let her know that she would not be here for her 12:45 appointment.  The appointment was rescheduled for Monday March 01, 2013.  I spoke with Barbara Parks and Dr. Milta Deiters nurse to explain that Monday would be out of window for the visit per the protocol.  Dr. Welton Flakes agreed that the patient could be seen by Barbara Bras, NP tomorrow.  I attempted to call [name] multiple times to see if she could come tomorrow and left two voicemail messages.  As of this writing I have not heard from her.  Will continue to attempt to reach her tomorrow morning. Spoke with Barbara Parks, the study monitor, and he said that if she is unable to come in tomorrow due to lack of transportation, the only thing out of window would be the physical examination, and that the visit date would be today, February 25, 2013.

## 2013-02-25 NOTE — Telephone Encounter (Signed)
, °

## 2013-02-25 NOTE — Telephone Encounter (Signed)
Pt called to rs her appt. gv appt for 03/01/13 @ 2:30pm. Pt is aware...td

## 2013-03-01 ENCOUNTER — Encounter: Payer: Self-pay | Admitting: Oncology

## 2013-03-01 ENCOUNTER — Ambulatory Visit (HOSPITAL_BASED_OUTPATIENT_CLINIC_OR_DEPARTMENT_OTHER): Payer: Self-pay | Admitting: Oncology

## 2013-03-01 ENCOUNTER — Telehealth: Payer: Self-pay | Admitting: Oncology

## 2013-03-01 ENCOUNTER — Encounter: Payer: Self-pay | Admitting: *Deleted

## 2013-03-01 ENCOUNTER — Other Ambulatory Visit: Payer: Self-pay | Admitting: *Deleted

## 2013-03-01 VITALS — BP 119/75 | HR 108 | Temp 97.9°F | Resp 20 | Ht 65.0 in | Wt 185.1 lb

## 2013-03-01 DIAGNOSIS — M1610 Unilateral primary osteoarthritis, unspecified hip: Secondary | ICD-10-CM

## 2013-03-01 DIAGNOSIS — R609 Edema, unspecified: Secondary | ICD-10-CM

## 2013-03-01 DIAGNOSIS — C50919 Malignant neoplasm of unspecified site of unspecified female breast: Secondary | ICD-10-CM

## 2013-03-01 DIAGNOSIS — M169 Osteoarthritis of hip, unspecified: Secondary | ICD-10-CM

## 2013-03-01 DIAGNOSIS — C7800 Secondary malignant neoplasm of unspecified lung: Secondary | ICD-10-CM

## 2013-03-01 DIAGNOSIS — C50419 Malignant neoplasm of upper-outer quadrant of unspecified female breast: Secondary | ICD-10-CM

## 2013-03-01 DIAGNOSIS — C7951 Secondary malignant neoplasm of bone: Secondary | ICD-10-CM

## 2013-03-01 DIAGNOSIS — M13849 Other specified arthritis, unspecified hand: Secondary | ICD-10-CM

## 2013-03-01 MED ORDER — INV-EVEROLIMUS (RAD0001) 5MG TABLET NOVARTIS CRAD001Y24135: 2.0000 | ORAL_TABLET | Freq: Every day | ORAL | Status: DC

## 2013-03-01 NOTE — Patient Instructions (Addendum)
Continue affinitor and letrozole  I will see you back in 8/28

## 2013-03-01 NOTE — Progress Notes (Signed)
03/01/13 at 4:10pm- [employer] 4, cycle 19, day 1 study notes- The pt's labs were obtained on 02/25/13. The pt's car broke down after her lab appointment on 02/25/13, and she missed her physical exam appointment on 02/25/13.  The pt was into the cancer center this afternoon for her physical exam and her drug exchange visit.  The pt returned her cycle 18 everolimus drug box with 4 remaining pills inside.  The pt confirmed that she took her everolimus (2 pills) daily from 01/28/13 through today, 03/01/13 (33 days).  The pt's drug box revealed 66 opened blister packs of everolimus.  Therefore, the pt has been 100% compliant in her study drug administration.  She also confirmed that she has taken her letrozole 2.5mg  dose daily along with her everolimus.  The pt was seen and examined today by Dr. Welton Flakes.  Dr. Welton Flakes reviewed her recent CT scans from 02/23/13, and the MD felt that her disease status is stable.  Dr. Welton Flakes signed the RECIST worksheet.  She stated that the scans show no evidence of progressive disease.  Dr. Welton Flakes wanted the pt to continue on her current dosing with no dose modifications.  The pt was very happy with her scan results, and she said that this treatment is "controlling my cancer".  The pt stated the only new adverse event is some mild bilateral knee pain that she feels is related to her letrozole.  The pt reports the following adverse events as ongoing:  hot flashes, memory impairment, intermittent nausea, intermittent diarrhea, arthritis in her hands, bilateral cataracts, and fatigue.  The pt states that her lower extremity edema and her bilateral athlete's foot has resolved.  The pt also denies any new medications.  She reports that she stopped using her econazole cream on 02/09/13.  She reports the following medications as ongoing:  Lopressor, compazine prn, immodium prn, lopid, ibuprofen prn, and vitamin D3.  The pt was dispensed her cycle 19 everolimus drug box today.  The pt was also given her new  appointments for cycle 20.  The pt states she is not able to do strenuous work, but she states she is able to perform most of ADL's.  ECOG=1  The pt had no questions/concerns about her treatment.

## 2013-03-01 NOTE — Telephone Encounter (Signed)
, °

## 2013-03-07 NOTE — Progress Notes (Signed)
OFFICE PROGRESS NOTE  CC  Barbara Espy, MD [address] [place] [address]  DIAGNOSIS: 62 year old female with metastatic breast cancer with bone metastasis and lung metastasis.  PRIOR THERAPY:  #1 patient originally presented when she was 62 years old with early stage breast cancer in 2005. At that time she underwent a lumpectomy with axillary lymph node dissection. She was followed by Dr. Pierce Crane and Theron Arista young. Her last visit to Dr. Renelda Loma office was in 2006. Because of difficulties with insurance she had not been receiving mammograms.  #2 in 2012 patient began having shortness of breath on exertion. She was seen by pulmonary service and a CT scan showed some minor mediastinal adenopathy as well as borderline lower lobe nodule. PET scan was not performed. October 2012 patient began to experience more shortness of breath. Initially she was told that she had pneumonia. However subsequently she developed lower extremity edema. CT of the chest was obtained that showed a large pleural effusion in the right side as well as pericardial effusion. She was seen by cardiothoracic surgery placed in 09/11/2011. The cytology revealed that patient indeed have recurrent breast cancer that was hormone receptor positive HER-2/neu was not obtained as it was insufficient tissue. She subsequently had a CT scan performed that showed 2 nodules in the lung suspicious for metastatic disease as well as left lower lobe collapse. She had a biopsy of the lungs performed on 09/11/2011 the tumor was estrogen receptor +91% progesterone receptor +100% proliferation marker Ki-67 35%.  #3 patient was seen by Dr. Pierce Crane and he recommended that she be enrolled on the BOLERO-4 clinical trial utilizing letrozole 2.5 mg per day and affinitor10 mg per day. She initiated this on 10/10/2011.she has had a significant response to this lower extremity edema has reduced significantly in size she is no longer short  of breath.  CURRENT THERAPY:BOLERO -4 with everolimus 10 mg and letrozole 2.5 mg daily. Patient will begin cycle 19 day 1 today. ( 28 day cycles)  INTERVAL HISTORY: Barbara Parks 62 y.o. female returns for followup visit prior to cycle 19.  Her main complaints include fatigue, and the arthritis in her fingers.  The hot flashes, forgetfulness, nausea, diarrhea, and cataracts are stable.  Her headaches have resolved.  She is taking the Everolimus and Letrozole daily and doing well. Patient is complaining of swelling in the left ankle and hands this may be due to the letrozole. However especially in the lower extremity I would like to do a Doppler ultrasound I will order this. Otherwise a 10 point ROS is neg.   MEDICAL HISTORY: Past Medical History  Diagnosis Date  . Asthma   . Shortness of breath   . Headache(784.0)     OTC Prilosec  . Arthritis     left hip  . Contact dermatitis     from a bandaid that had Latex   . breast ca 2005    breast/chemo R mastectomy  . Metastasis to lung dx'd 08/2011    ALLERGIES:  is allergic to aspirin; latex; and nsaids.  MEDICATIONS:  Current Outpatient Prescriptions  Medication Sig Dispense Refill  . cholecalciferol (VITAMIN D) 1000 UNITS tablet Take 2,000 Units by mouth daily.       Marland Kitchen gemfibrozil (LOPID) 600 MG tablet Take 1 tablet (600 mg total) by mouth 2 (two) times daily before a meal.  60 tablet  1  . ibuprofen (ADVIL,MOTRIN) 200 MG tablet Take 400 mg by mouth every 6 (six)  hours as needed. For pain      . Investigational everolimus (RAD001) 5 MG tablet [employer] [id number] Take 2 tablets by mouth daily. Take with a glass of water.  70 tablet  0  . Investigational everolimus (RAD001) 5 MG tablet [employer] [id number] Take 2 tablets by mouth daily. Take with a glass of water.  70 tablet  0  . letrozole (FEMARA) 2.5 MG tablet Take 1 tablet (2.5 mg total) by mouth daily.  90 tablet  6  . loperamide (IMODIUM) 2 MG capsule Take 2 mg by mouth 4  (four) times daily as needed for diarrhea or loose stools.      . metoprolol tartrate (LOPRESSOR) 25 MG tablet Take 0.5 tablets (12.5 mg total) by mouth daily.  30 tablet  4  . prochlorperazine (COMPAZINE) 10 MG tablet Take 10 mg by mouth every 6 (six) hours as needed. For nausea      . econazole nitrate 1 % cream Apply 1 application topically 2 (two) times daily.      . Investigational everolimus (RAD001) 5 MG tablet [employer] [id number] Take 2 tablets by mouth daily. Take with a glass of water.  70 tablet  0   No current facility-administered medications for this visit.    SURGICAL HISTORY:  Past Surgical History  Procedure Laterality Date  . Spine surgery  1996  . Breast surgery  2005    right  . Video bronchoscopy  09/11/2011    Procedure: VIDEO BRONCHOSCOPY;  Surgeon: Alleen Borne, MD;  Location: [facility] OR;  Service: Thoracic;  Laterality: N/A;  . Chest tube insertion  09/11/2011    Procedure: INSERTION PLEURAL DRAINAGE CATHETER;  Surgeon: Alleen Borne, MD;  Location: [facility] OR;  Service: Thoracic;  Laterality: Right;  . Pericardial window  09/11/2011    Procedure: PERICARDIAL WINDOW;  Surgeon: Alleen Borne, MD;  Location: [facility] OR;  Service: Thoracic;  Laterality: N/A;  . Removal of pleural drainage catheter  12/19/2011    Procedure: REMOVAL OF PLEURAL DRAINAGE CATHETER;  Surgeon: Alleen Borne, MD;  Location: [facility] OR;  Service: Thoracic;  Laterality: Right;  TO BE DONE IN MINOR ROOM, SHORT STAY    REVIEW OF SYSTEMS:  General: fatigue (+), night sweats (-), fever (-), pain (-) Lymph: palpable nodes (-) HEENT: vision changes (-), mucositis (-), gum bleeding (-), epistaxis (-) Cardiovascular: chest pain (-), palpitations (-) Pulmonary: shortness of breath (-), dyspnea on exertion (-), cough (-), hemoptysis (-) GI:  Early satiety (-), melena (-), dysphagia (-), nausea/vomiting (-), diarrhea (-) GU: dysuria (-), hematuria (-), incontinence (-) Musculoskeletal: joint swelling (-), joint pain  (+), back pain (-) Neuro: weakness (-), numbness (-), headache (-), confusion (-) Skin: Rash (-), lesions (-), dryness (-) Psych: depression (-), suicidal/homicidal ideation (-), feeling of hopelessness (-)   PHYSICAL EXAMINATION: Blood pressure 119/75, pulse 108, temperature 97.9 F (36.6 C), temperature source Oral, resp. rate 20, height 5\' 5"  (1.651 m), weight 185 lb 1.6 oz (83.961 kg). Body mass index is 30.8 kg/(m^2). General: Patient is a well appearing female in no acute distress HEENT: PERRLA, sclerae anicteric no conjunctival pallor, MMM Neck: supple, no palpable adenopathy Lungs: clear to auscultation bilaterally, no wheezes, rhonchi, or rales Cardiovascular: regular rate rhythm, S1, S2, no murmurs, rubs or gallops Abdomen: Soft, non-tender, non-distended, normoactive bowel sounds, no HSM Extremities: warm and well perfused, no clubbing, cyanosis, or edema Skin: No rashes or lesions Neuro: Non-focal ECOG PERFORMANCE STATUS: 1 - Symptomatic but completely ambulatory  LABORATORY DATA: Lab Results  Component Value Date   WBC 6.8 02/25/2013   HGB 13.8 02/25/2013   HCT 41.7 02/25/2013   MCV 78.5* 02/25/2013   PLT 280 02/25/2013      Chemistry      Component Value Date/Time   NA 141 02/25/2013 0936   NA 136 07/16/2012 0910   K 4.2 02/25/2013 0936   K 4.0 07/16/2012 0910   CL 109* 12/31/2012 0940   CL 103 07/16/2012 0910   CO2 22 02/25/2013 0936   CO2 21 07/16/2012 0910   BUN 15.8 02/25/2013 0936   BUN 16 07/16/2012 0910   CREATININE 0.9 02/25/2013 0936   CREATININE 0.75 07/16/2012 0910      Component Value Date/Time   CALCIUM 10.3 02/25/2013 0936   CALCIUM 10.1 07/16/2012 0910   ALKPHOS 182* 02/25/2013 0936   ALKPHOS 142* 07/16/2012 0910   AST 25 02/25/2013 0936   AST 30 07/16/2012 0910   ALT 23 02/25/2013 0936   ALT 33 07/16/2012 0910   BILITOT 0.23 02/25/2013 0936   BILITOT 0.2* 07/16/2012 0910       RADIOGRAPHIC STUDIES: CT CHEST, ABDOMEN AND PELVIS WITH  CONTRAST  Technique: Contiguous axial images of the chest abdomen and pelvis  were obtained after IV contrast administration.  Contrast: 100 ml Omnipaque-300  Comparison: 11/03/2012  CT CHEST  RECIST protocol 1.0  Target Lesions:  1.Right lower lobe lung "nodule" 5 mm on image 28/series 5 versus 6  mm on the prior.  2. Subcarinal lymph node 9 mm short axis on image 28/series 2  versus 8 mm on the prior.  3. Right hilar node 9 mm on image 26/series 2 versus similar on  the prior exam (when remeasured).  Non Target lesions:  1. Findings suspicious for osseous metastasis. Similar.  Findings: Lung windows demonstrate mild scarring at the right lung  base. Clear left lung.  Soft tissue windows demonstrate no supraclavicular adenopathy.  Right mastectomy and axillary nodal dissection. No axillary  adenopathy. Tortuous descending thoracic aorta. Heart size upper  normal, without pericardial effusion. Trace right-sided pleural  thickening is unchanged. No central pulmonary embolism, on this non-  dedicated study. No mediastinal or hilar adenopathy. No internal  mammary adenopathy.  A sclerotic lesion involving the left posterior 11th rib is similar  on image 45/series 2. This is absent on 08/09/2010.  IMPRESSION:  1. Similar eleventh posterior left rib sclerosis, suspicious for  osseous metastasis.  2. No evidence of extra osseous metastasis within the chest.  CT ABDOMEN AND PELVIS  Findings: Moderate to marked hepatic steatosis. Similar vague  hyperenhancement the right lobe liver on image 52/series 2, likely  perfusion anomaly. There is sparing of steatosis adjacent the  gallbladder. Normal spleen, stomach. Duodenal diverticulum  involves the transverse segment. Normal pancreas, gallbladder,  biliary tract, right gland, kidneys. Minimal left adrenal  nodularity is unchanged. No retroperitoneal or retrocrural  adenopathy.  Normal colon, appendix, and terminal ileum. Normal small  bowel  without abdominal ascites.  No evidence of omental or peritoneal disease.  No pelvic adenopathy. Normal urinary bladder and uterus. No  adnexal mass. No significant free fluid.  Advanced left hip osteoarthritis. Vague sclerosis involving the  left iliac crest is unchanged. A well-defined sclerotic lesion in  the right iliac crest is similar. Subtle sclerosis in the anterior  left sacrum on image 94 is similar.  IMPRESSION:  1. Similar subtle osseous sclerosis, for which metastatic disease  cannot be excluded.  2. No  extra osseous metastasis within the abdomen/pelvis.  3. Moderate to marked hepatic steatosis.      ASSESSMENT: 62 year old female with  #1 original diagnosis of breast cancer in 2005. She was treated with a lumpectomy followed by radiation. She apparently did go on antiestrogen therapy. Thereafter she was lost to follow up due to lack of finances and loss of insurance.    2 patient was seen by Dr. Donnie Coffin in March 2013 with metastatic recurrent breast cancer that was ER positive PR positive with lung metastasis malignant pleural effusion and malignant pericardial effusion.she underwent pericardial window as well as Pleurx catheter placement.  #3 she was then begun on BOLERO 4 clinical trial consisting of everolimus 10 mg and letrozole 2.5 mg given on a daily basis on a 28 day cycle. So far she has completed 18 cycles. She is here for cycle number 19.   #4patient had CT scans performed on 12/29/2012.the scan does not show any progressive disease and clinically patient seems to be doing quite nicely.   #5 CT scan scan on 02/23/2013 reveal stable disease.    PLAN:   #1 patient will proceed with cycle 19 of her chemotherapy consisting of everolimus and letrozole.  #2 She will return in 4 weeks' time for followup (03/25/13)  All questions were answered. The patient knows to call the clinic with any problems, questions or concerns. We can certainly see the patient  much sooner if necessary.  I spent 25 minutes counseling the patient face to face. The total time spent in the appointment was 25 minutes.  Drue Second, MD Medical/[facility] [phone] (beeper) [phone] (Office)

## 2013-03-09 IMAGING — CR DG CHEST 2V
2 series · 2 of 2 positions shown · non-contrast
Comparison: 08/08/2010

CLINICAL DATA: 60-year-old female with cough and shortness of
breath.

CHEST - 2 VIEW

[view not recorded (1 of 2)]
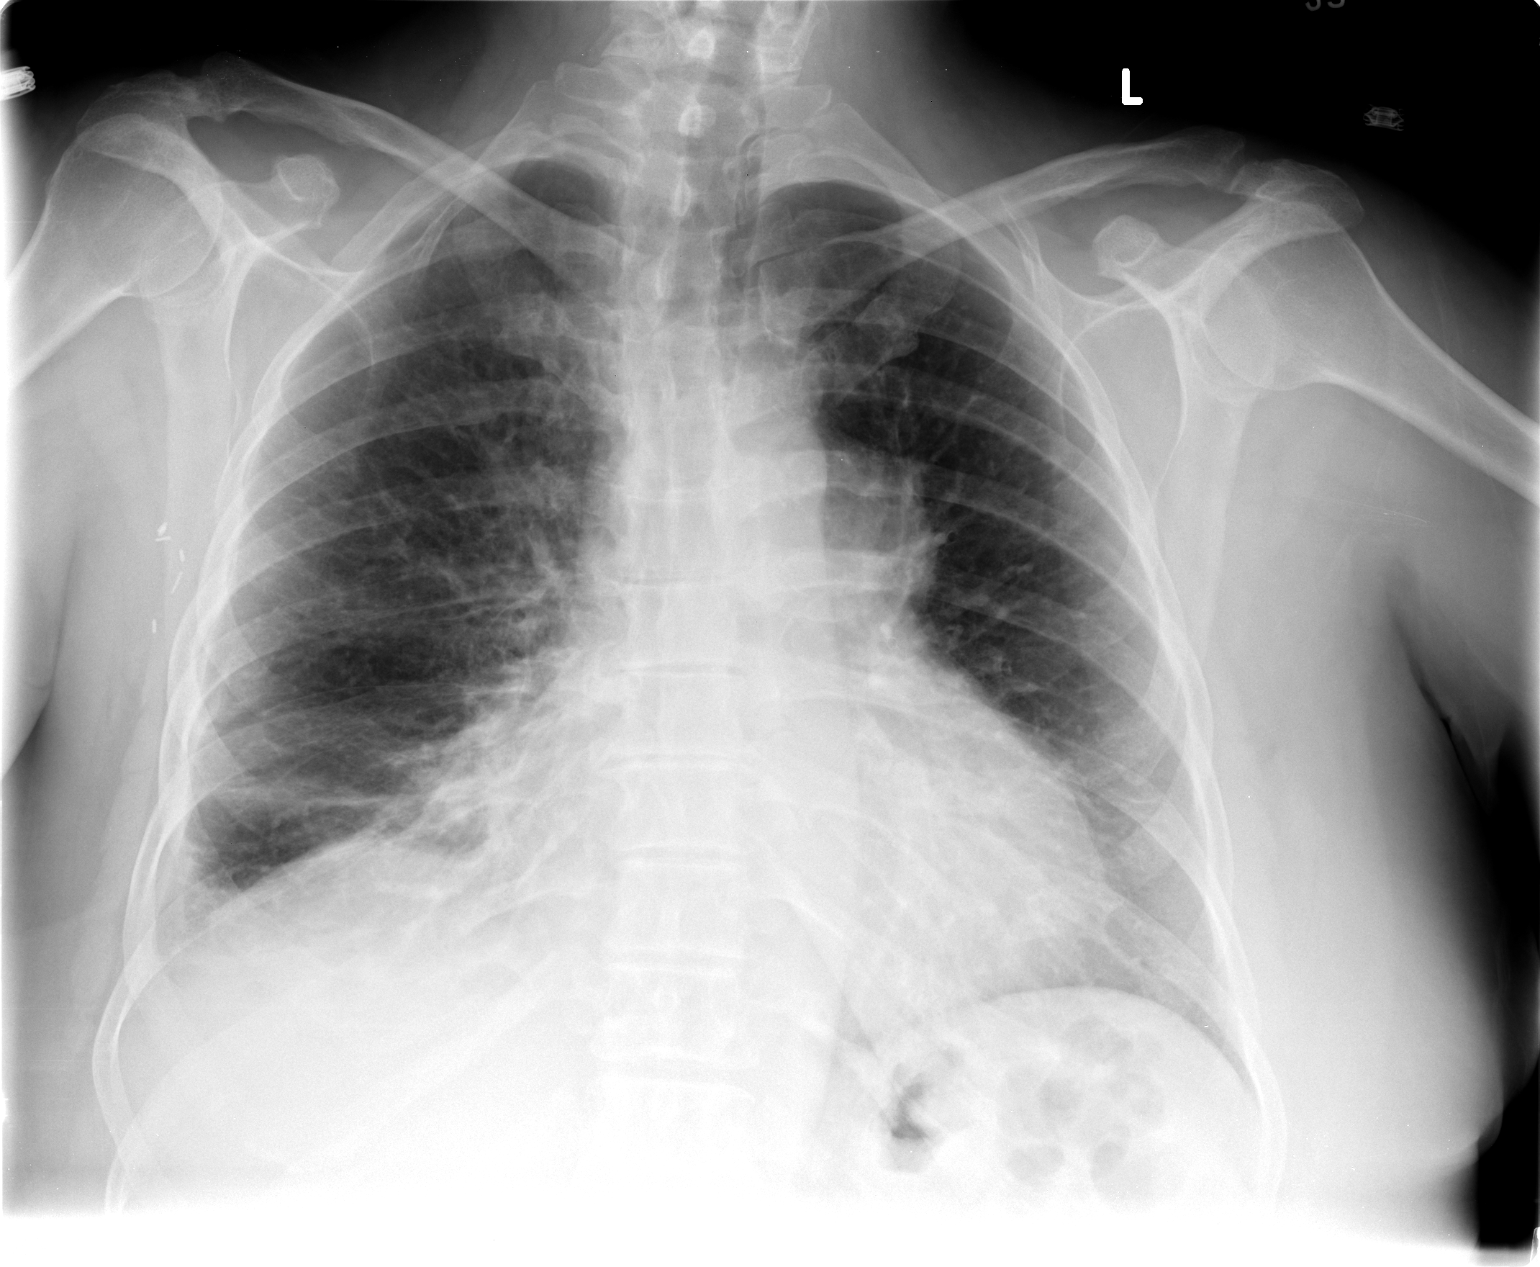

[view not recorded (2 of 2)]
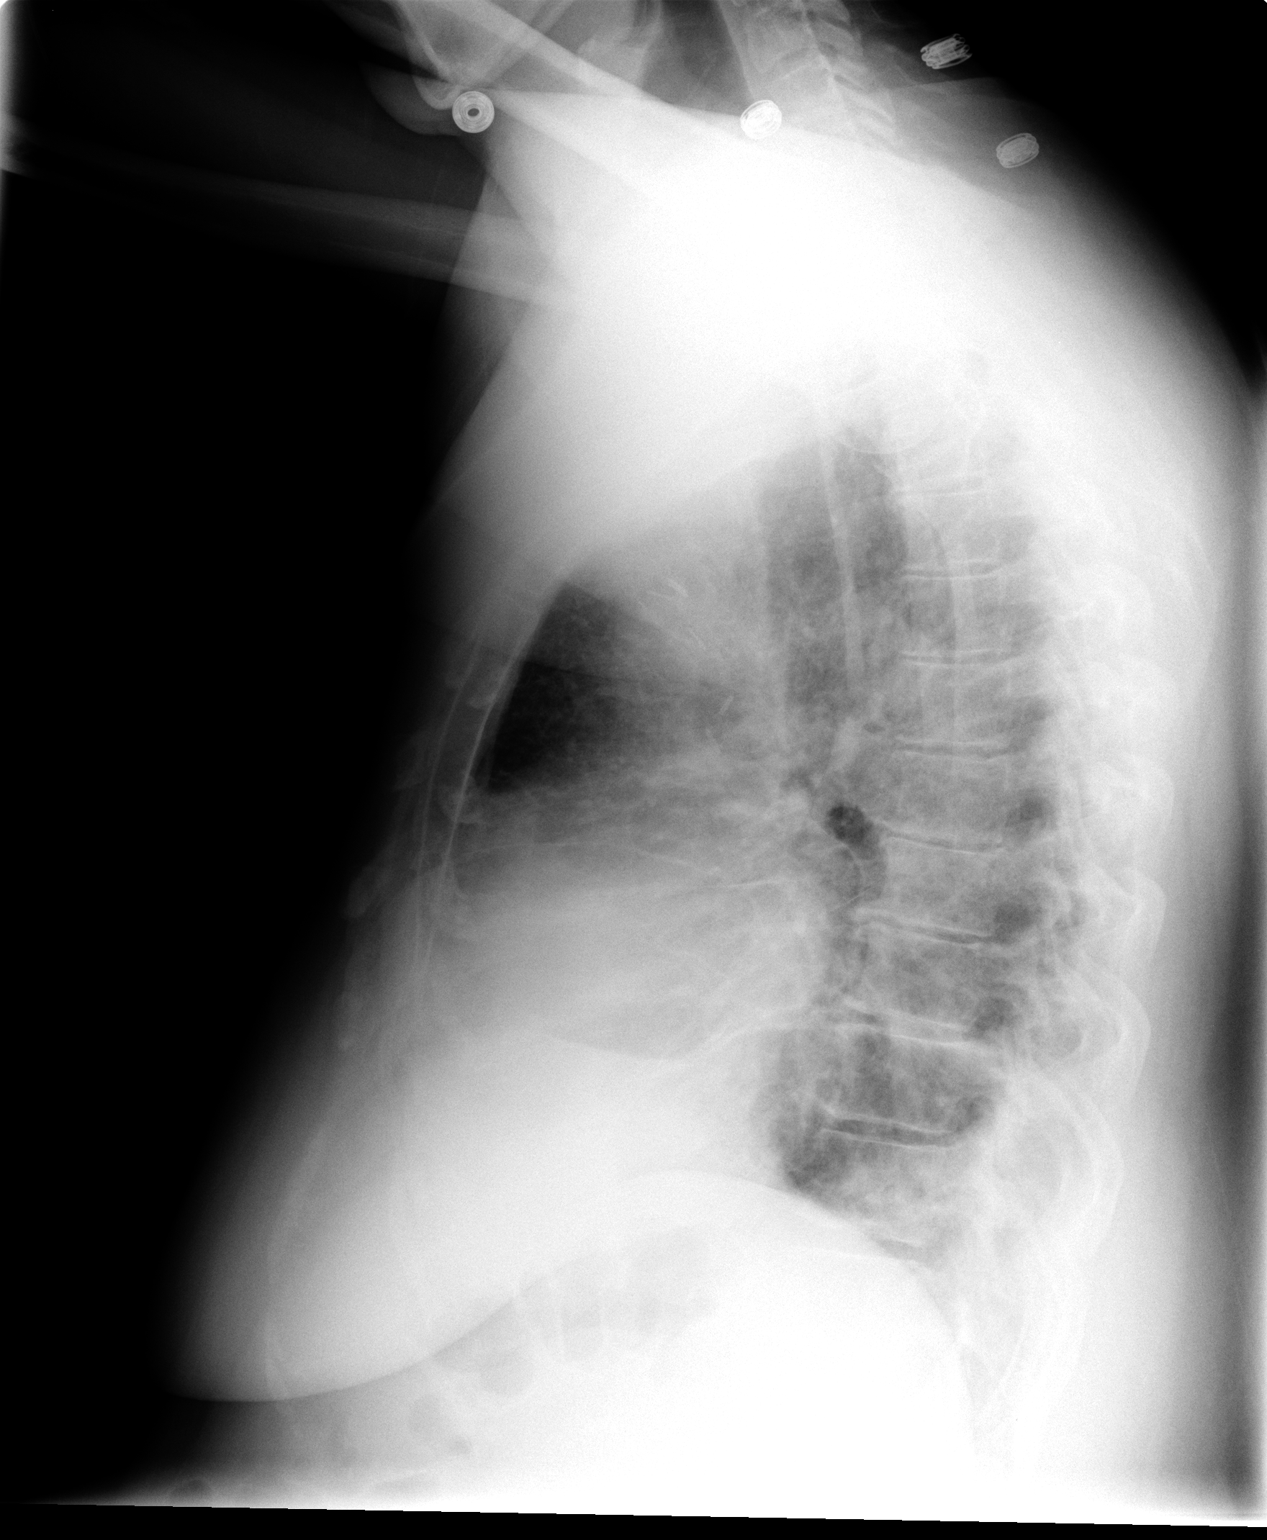

[2 of 2 positions shown; findings below may reference images not displayed]

FINDINGS: New airspace disease in the right lower lung is
identified likely representing pneumonia.
Upper limits normal heart size again identified.
There may be a tiny right pleural effusion present.
There is no evidence of pneumothorax or left pleural effusion.
Right mastectomy and axillary surgery again noted.
IMPRESSION: New right lower lung airspace disease likely representing
pneumonia.  Radiographic follow up to resolution is recommended to
exclude other processes. Question tiny right pleural effusion.

## 2013-03-20 IMAGING — CT CT CHEST W/O CM
4 series · 18 of 30 positions shown, 19 images · non-contrast
Comparison: Chest x-ray dated 08/25/1998 [DATE] and chest CT dated
08/09/2010

CLINICAL DATA: Shortness of breath.  Recent pneumonia.  786.05.

CT CHEST WITHOUT CONTRAST
TECHNIQUE: Multidetector CT imaging of the chest was performed
following the standard protocol without IV contrast.

[Series 2: chest w/o · axial · non-contrast · 0.70mm/px · z∈[-204,-104]mm · 3 of 62 slices shown]
[im 21/62  lung]
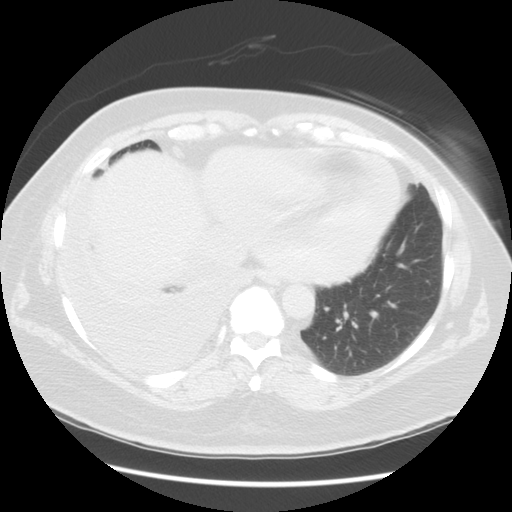
[im 33/62  lung]
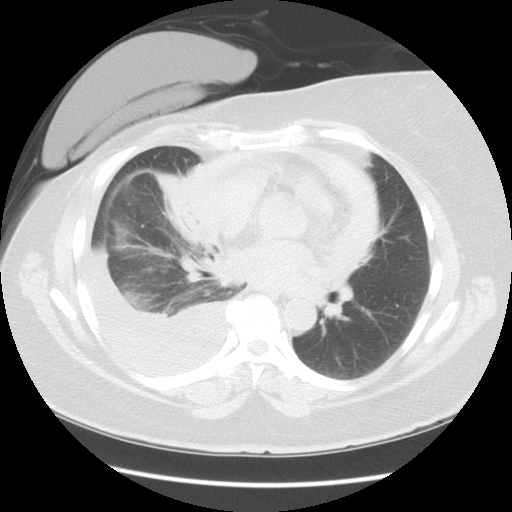
[im 41/62  lung]
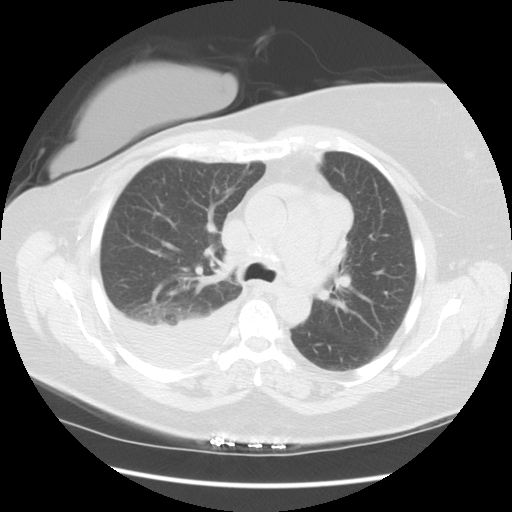

[Series 3: lung windows · axial · 0.70mm/px · z∈[-229,-79]mm · 4 of 62 slices shown, 5 images]
[im 16/62  mediastinal]
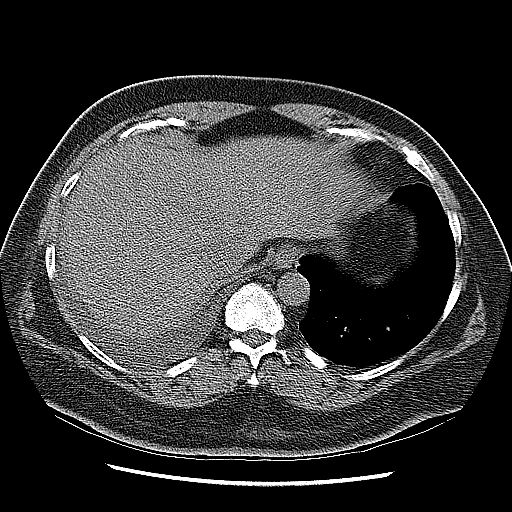
[im 16/62  lung]
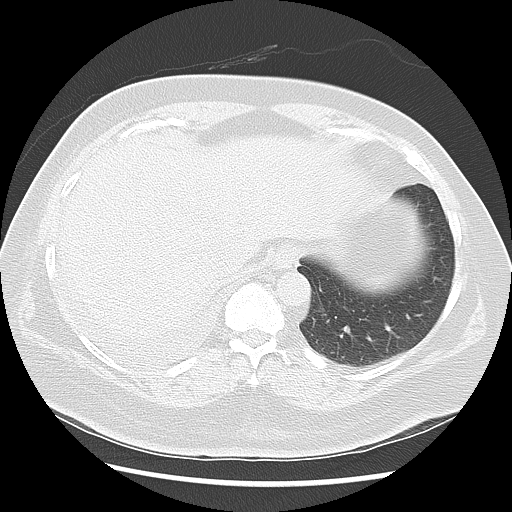
[im 31/62  lung]
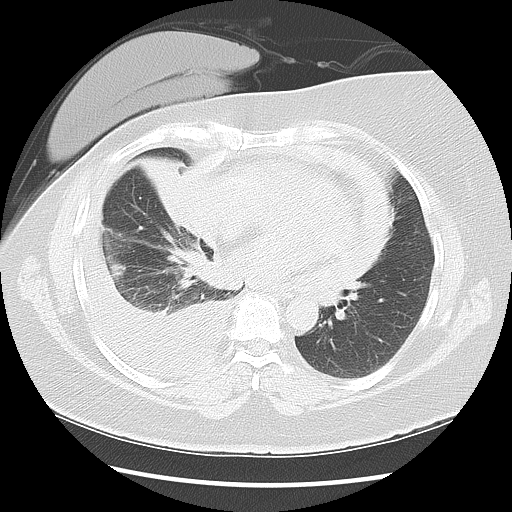
[im 33/62  lung]
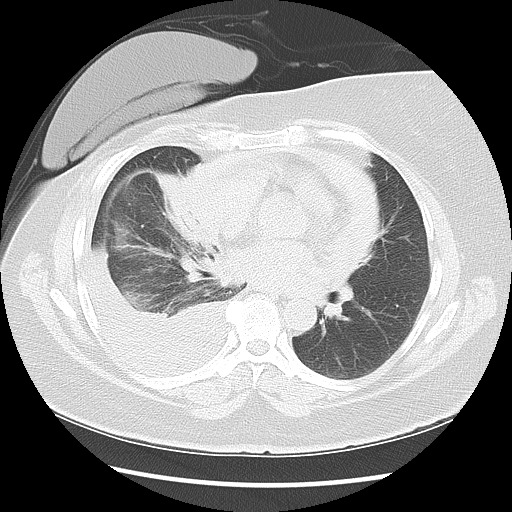
[im 46/62  lung]
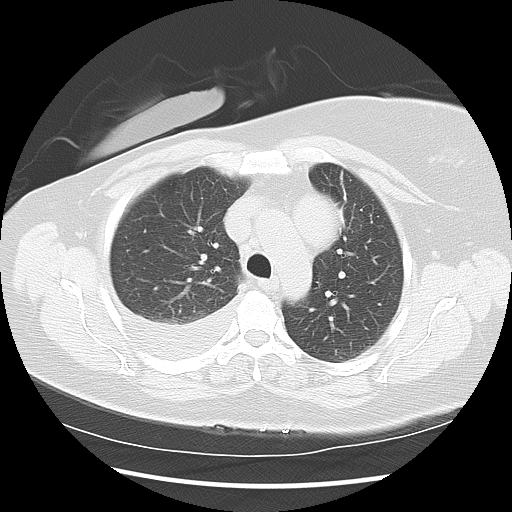

[Series 400: sag · sagittal · 0.70mm/px · 8 of 143 slices shown]
[im 15/143  lung]
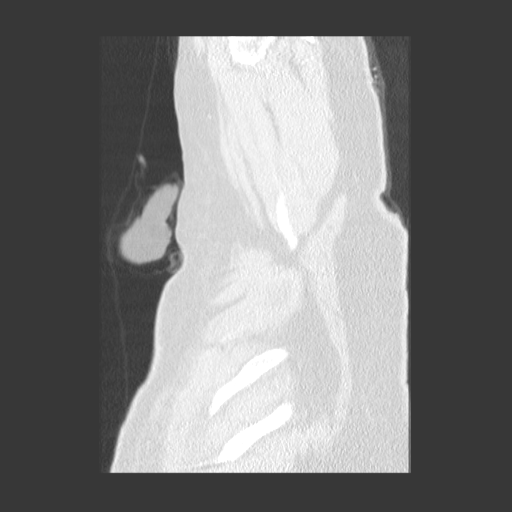
[im 29/143  lung]
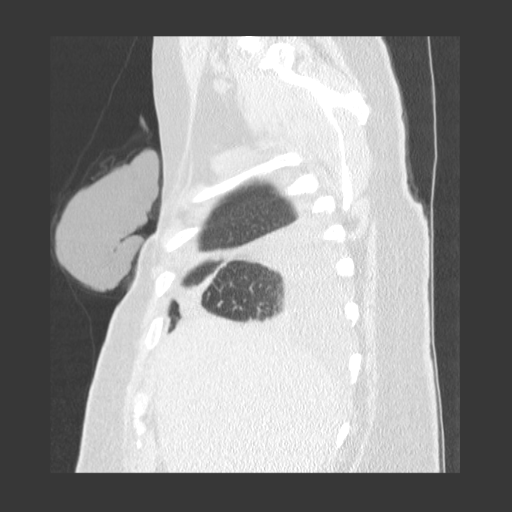
[im 43/143  lung]
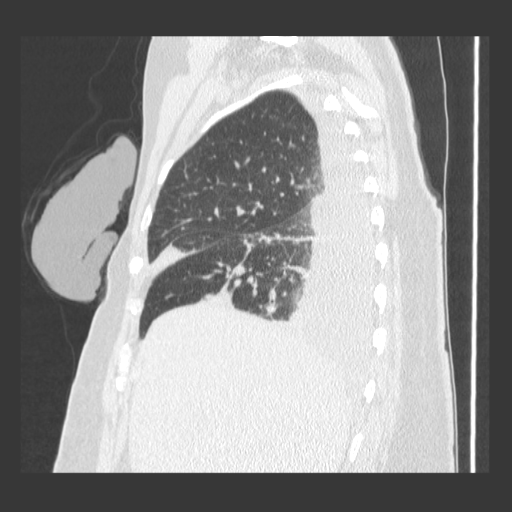
[im 57/143  lung]
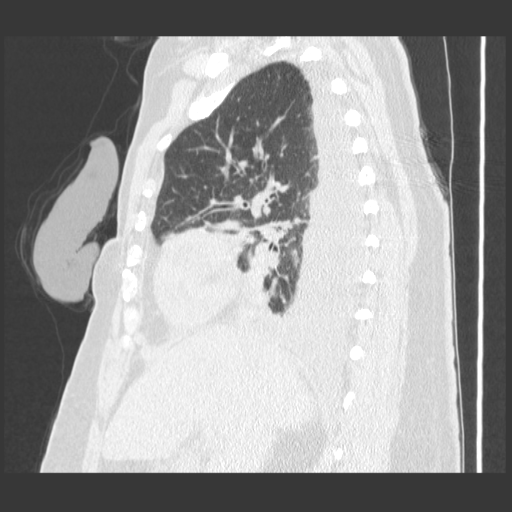
[im 86/143  lung]
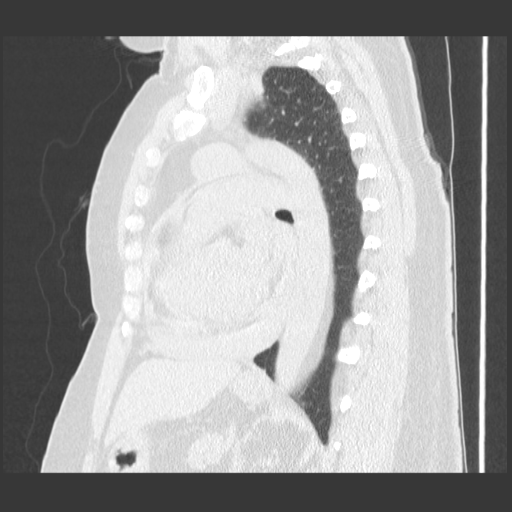
[im 100/143  lung]
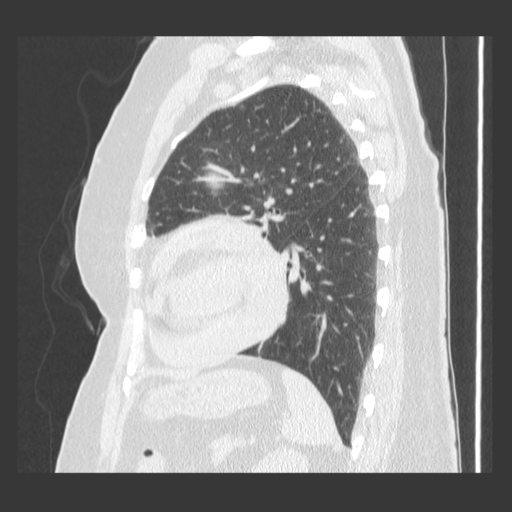
[im 114/143  lung]
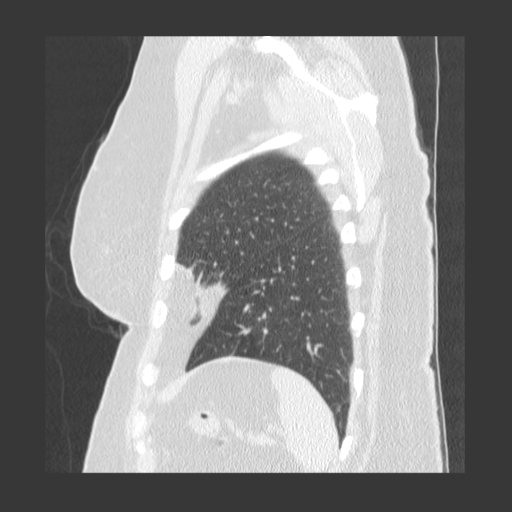
[im 128/143  lung]
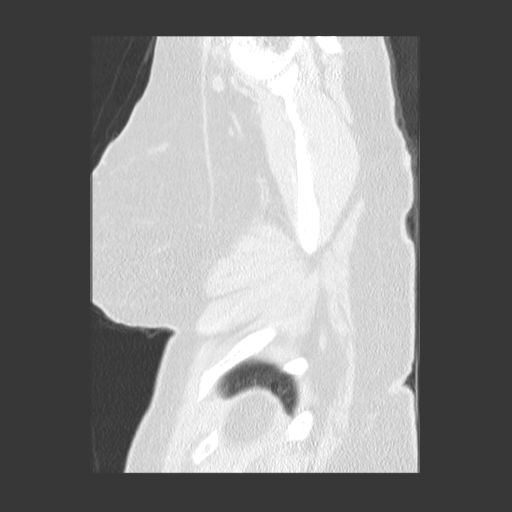

[Series 401: cor · coronal · 0.70mm/px · 3 of 108 slices shown]
[im 16/108  lung]
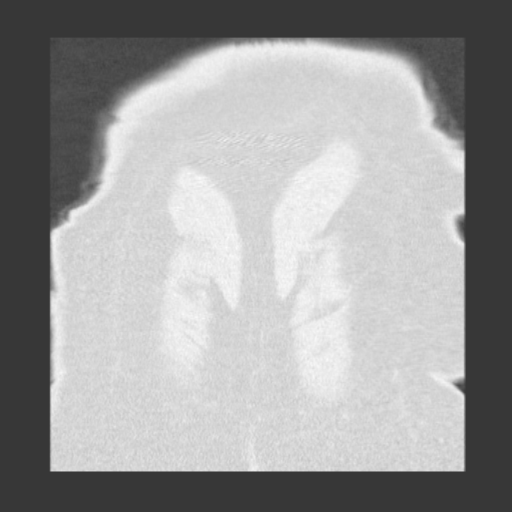
[im 31/108  lung]
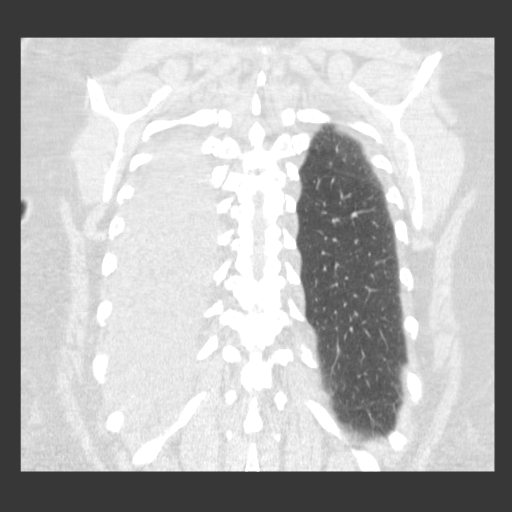
[im 46/108  lung]
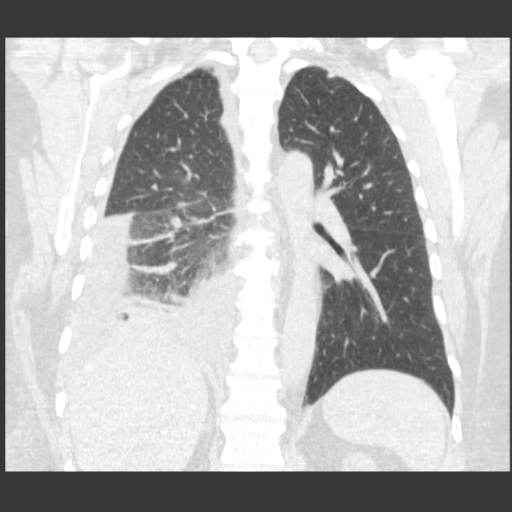

[18 of 30 positions shown; findings below may reference images not displayed]

FINDINGS: The patient has a large pericardial effusion which is
relatively dense.  This could represent hemorrhage or a
proteinaceous effusion.  There is an enlarged peripheral pulmonary
nodule in the right lower lobe.  This was present on the prior
study of 08/09/2010 but is now 12 x 11 x 11 millimeters.  There is
a new 11 x 9 x 5 mm nodule slightly more superiorly in the right
lower lobe adjacent to the major fissure.

There is extensive volume loss and consolidation and atelectasis in
the right middle lobe.  There is narrowing of the right middle lobe
bronchus centrally with abnormal soft tissue in the inferior aspect
of the right hilum which may represent infiltrating tumor.

The patient has calcified lymph nodes in the subcarinal and pre
carinal region which are essentially unchanged since the prior
exam.

Large right pleural effusion. The heart size is normal.  No acute
osseous abnormality.  Visualized portion of the upper abdomen is
normal. Left lung is clear. The patient has had a right mastectomy.
No adenopathy in the axilla.
IMPRESSION: 1.  Two nodules in the right lower lobe worrisome for malignancy.
Abnormal soft tissue density in the inferior aspect of the right
hilum narrowing the right lower lobe and right middle lobe bronchi
worrisome for tumor.
2.  Large pericardial effusion.  I suspect this is a malignant
pericardial effusion.
3.  Large right pleural effusion.

Report called to Dr. Santos Nicolas by Dr. Jinwei on the 09/06/2011.

## 2013-03-25 ENCOUNTER — Encounter: Payer: Self-pay | Admitting: Oncology

## 2013-03-25 ENCOUNTER — Other Ambulatory Visit (HOSPITAL_BASED_OUTPATIENT_CLINIC_OR_DEPARTMENT_OTHER): Payer: Self-pay | Admitting: Lab

## 2013-03-25 ENCOUNTER — Telehealth: Payer: Self-pay | Admitting: *Deleted

## 2013-03-25 ENCOUNTER — Ambulatory Visit: Payer: Self-pay | Admitting: Adult Health

## 2013-03-25 ENCOUNTER — Other Ambulatory Visit: Payer: Self-pay | Admitting: *Deleted

## 2013-03-25 ENCOUNTER — Encounter: Payer: Self-pay | Admitting: *Deleted

## 2013-03-25 ENCOUNTER — Ambulatory Visit (HOSPITAL_BASED_OUTPATIENT_CLINIC_OR_DEPARTMENT_OTHER): Payer: Self-pay | Admitting: Oncology

## 2013-03-25 VITALS — BP 124/83 | HR 105 | Temp 98.0°F | Resp 20 | Ht 65.0 in | Wt 183.4 lb

## 2013-03-25 DIAGNOSIS — C50419 Malignant neoplasm of upper-outer quadrant of unspecified female breast: Secondary | ICD-10-CM

## 2013-03-25 DIAGNOSIS — C50919 Malignant neoplasm of unspecified site of unspecified female breast: Secondary | ICD-10-CM

## 2013-03-25 DIAGNOSIS — C7800 Secondary malignant neoplasm of unspecified lung: Secondary | ICD-10-CM

## 2013-03-25 LAB — CBC WITH DIFFERENTIAL/PLATELET
BASO%: 0.6 % (ref 0.0–2.0)
Eosinophils Absolute: 0.1 10*3/uL (ref 0.0–0.5)
HCT: 44.5 % (ref 34.8–46.6)
LYMPH%: 15.1 % (ref 14.0–49.7)
MCHC: 33 g/dL (ref 31.5–36.0)
MONO#: 0.8 10*3/uL (ref 0.1–0.9)
NEUT#: 4.4 10*3/uL (ref 1.5–6.5)
NEUT%: 69.6 % (ref 38.4–76.8)
Platelets: 235 10*3/uL (ref 145–400)
WBC: 6.3 10*3/uL (ref 3.9–10.3)
lymph#: 1 10*3/uL (ref 0.9–3.3)
nRBC: 0 % (ref 0–0)

## 2013-03-25 LAB — COMPREHENSIVE METABOLIC PANEL (CC13)
Alkaline Phosphatase: 195 U/L — ABNORMAL HIGH (ref 40–150)
CO2: 19 mEq/L — ABNORMAL LOW (ref 22–29)
Chloride: 110 mEq/L — ABNORMAL HIGH (ref 98–109)
Creatinine: 1 mg/dL (ref 0.6–1.1)
Potassium: 3.9 mEq/L (ref 3.5–5.1)
Total Bilirubin: 0.34 mg/dL (ref 0.20–1.20)
Total Protein: 8.1 g/dL (ref 6.4–8.3)

## 2013-03-25 LAB — BILIRUBIN, DIRECT: Bilirubin, Direct: <0.1 mg/dL (ref 0.0–0.3)

## 2013-03-25 LAB — LIPID PANEL
HDL: 51 mg/dL (ref >39)
LDL Cholesterol: 117 mg/dL — ABNORMAL HIGH (ref 0–99)
Total CHOL/HDL Ratio: 4.1 Ratio
Triglycerides: 204 mg/dL — ABNORMAL HIGH (ref <150)
VLDL: 41 mg/dL — ABNORMAL HIGH (ref 0–40)

## 2013-03-25 IMAGING — CR DG CHEST 2V
2 series · 2 of 2 positions shown · non-contrast
Comparison: 09/06/2011

CLINICAL DATA: preop surgery

CHEST - 2 VIEW

[view not recorded (1 of 2)]
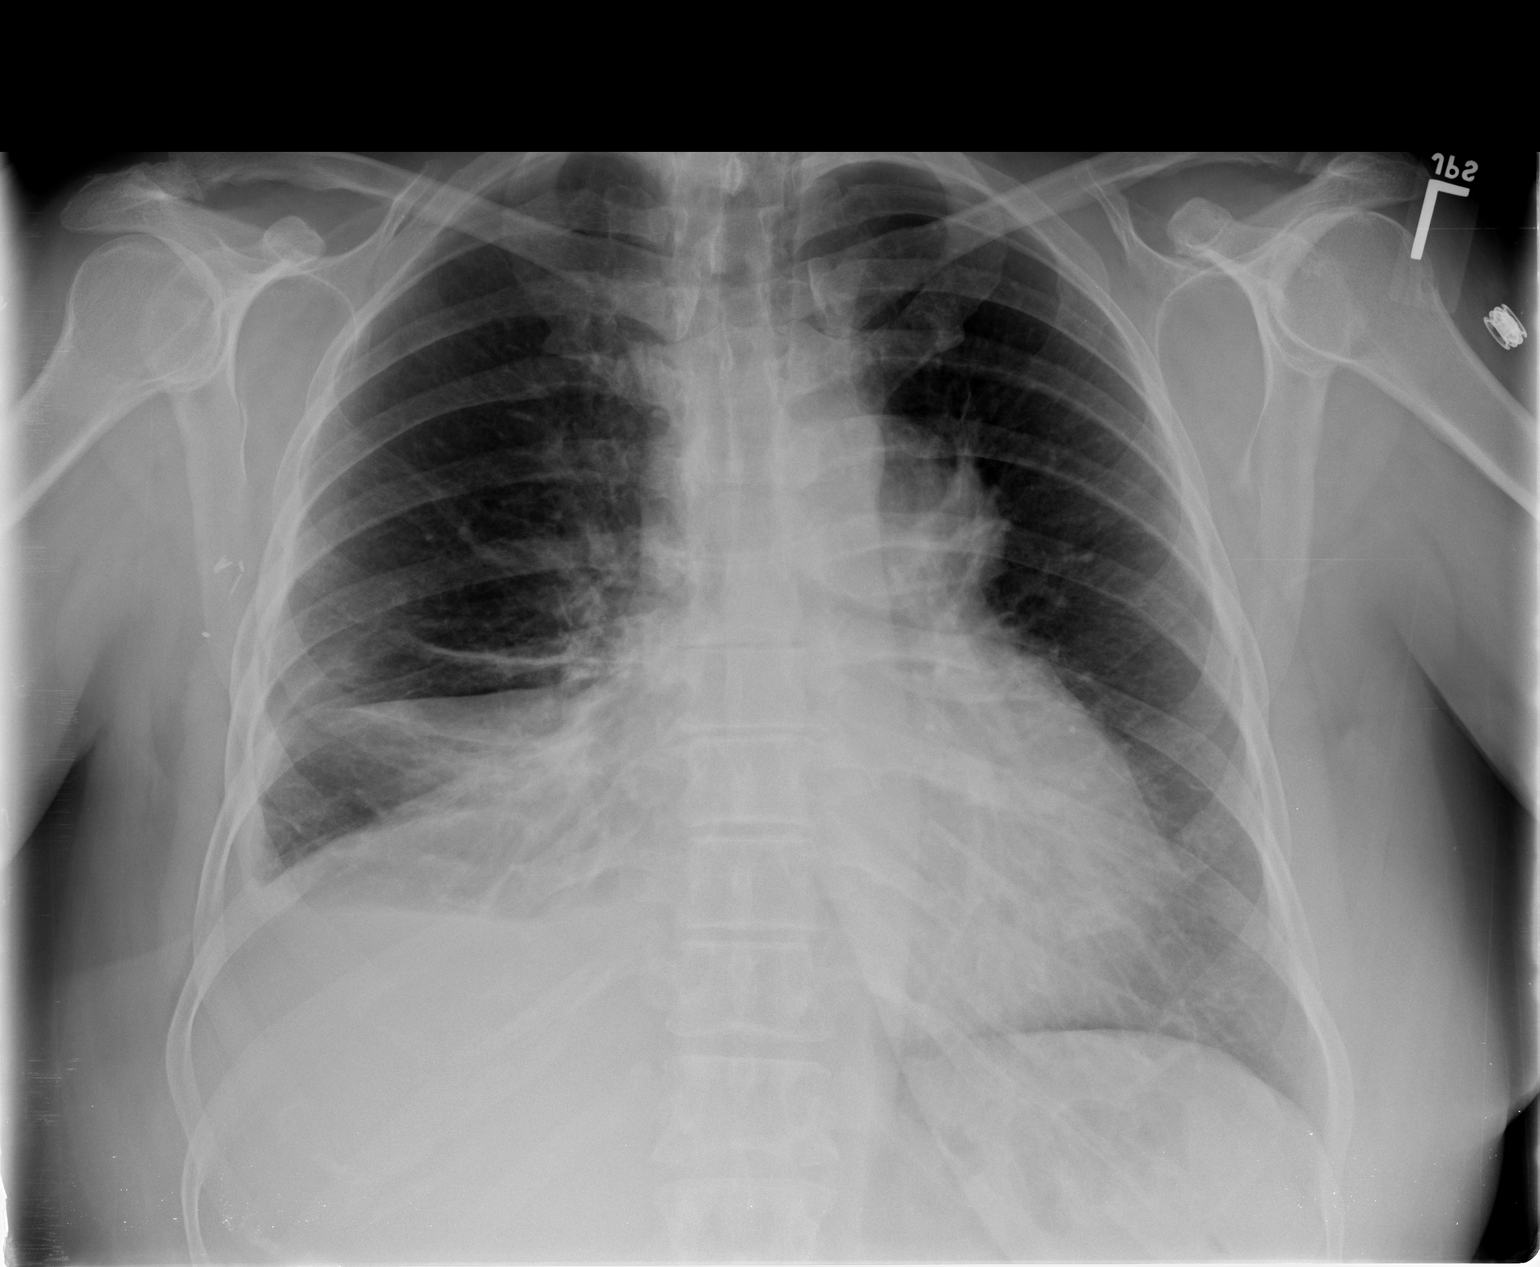

[view not recorded (2 of 2)]
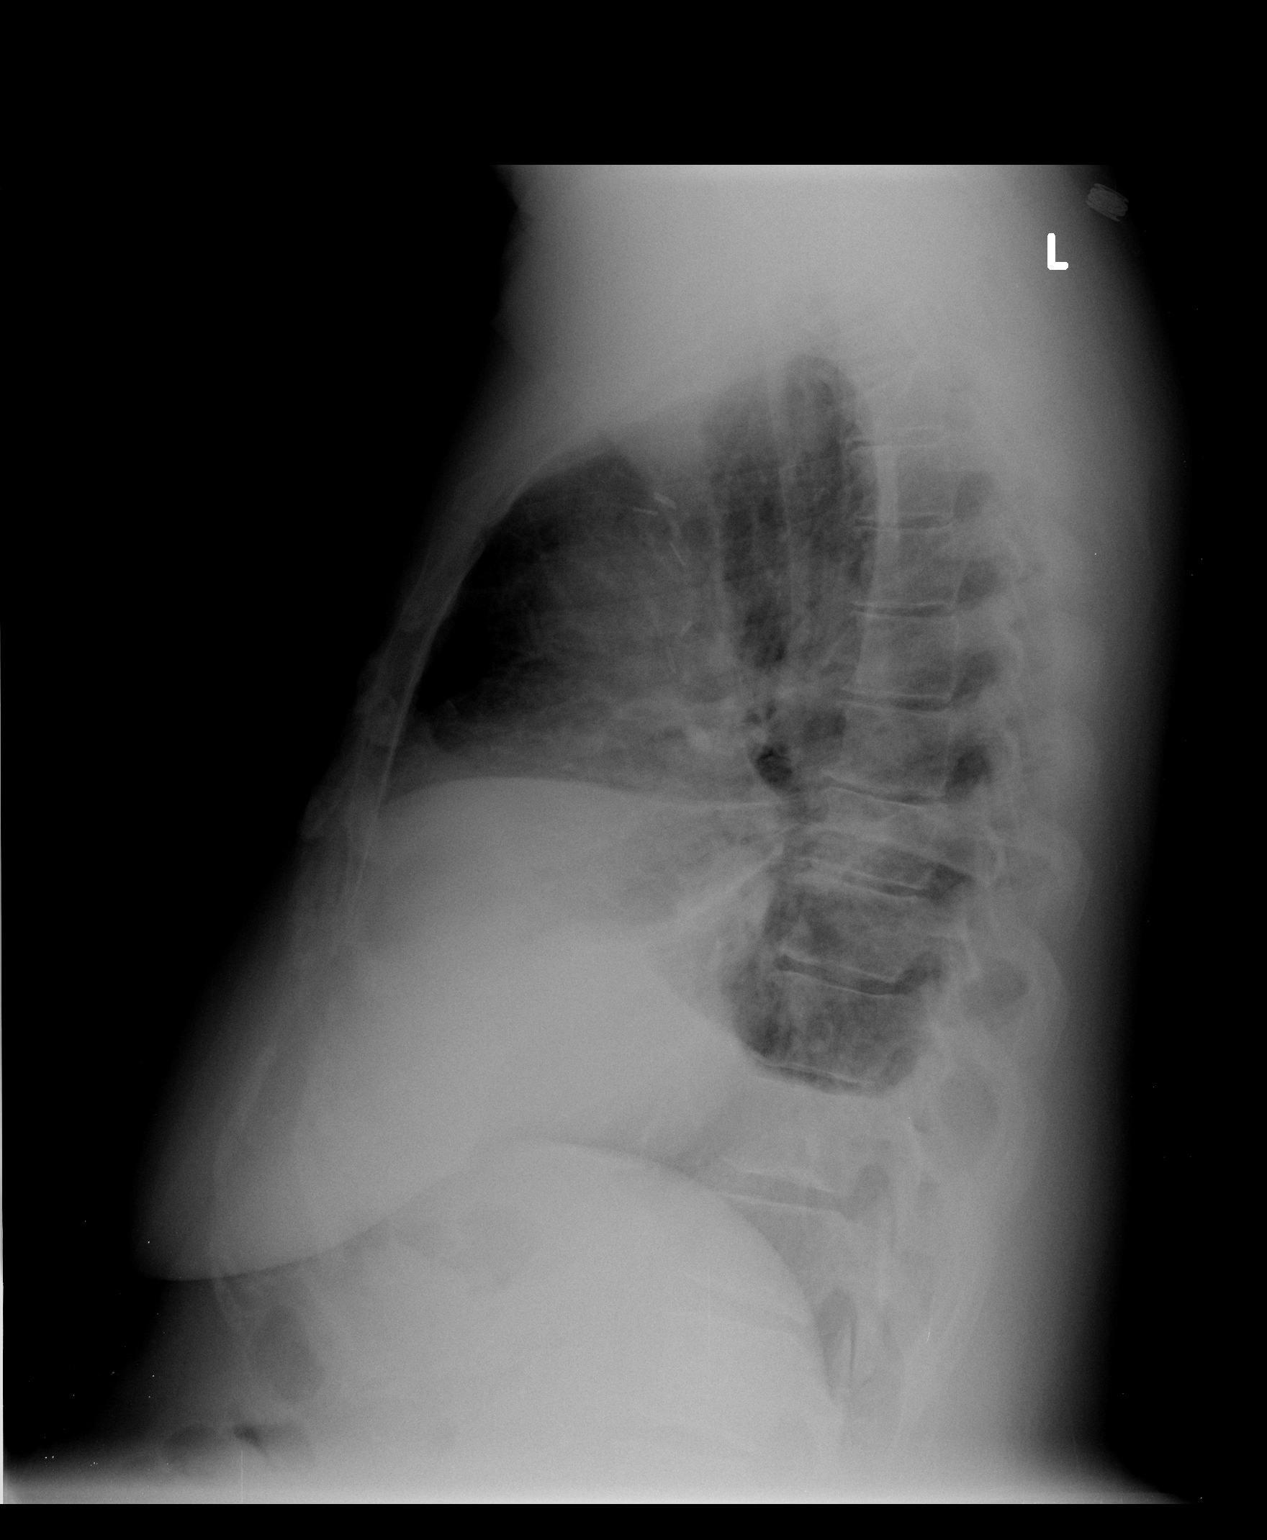

[2 of 2 positions shown; findings below may reference images not displayed]

FINDINGS: Heart size is normal.

There is a moderate-sized right pleural effusion.

Atelectasis within the right base is identified and appears
unchanged from previous study.  The left lung is clear.
IMPRESSION: 1.  No change in the right pleural effusion and right base
atelectasis.

## 2013-03-25 IMAGING — CR DG CHEST 1V PORT
1 series · 1 of 1 positions shown · non-contrast
Comparison: 09/11/2011 and CT chest 09/06/2011.

CLINICAL DATA: Postop.

PORTABLE CHEST - 1 VIEW

[AP]
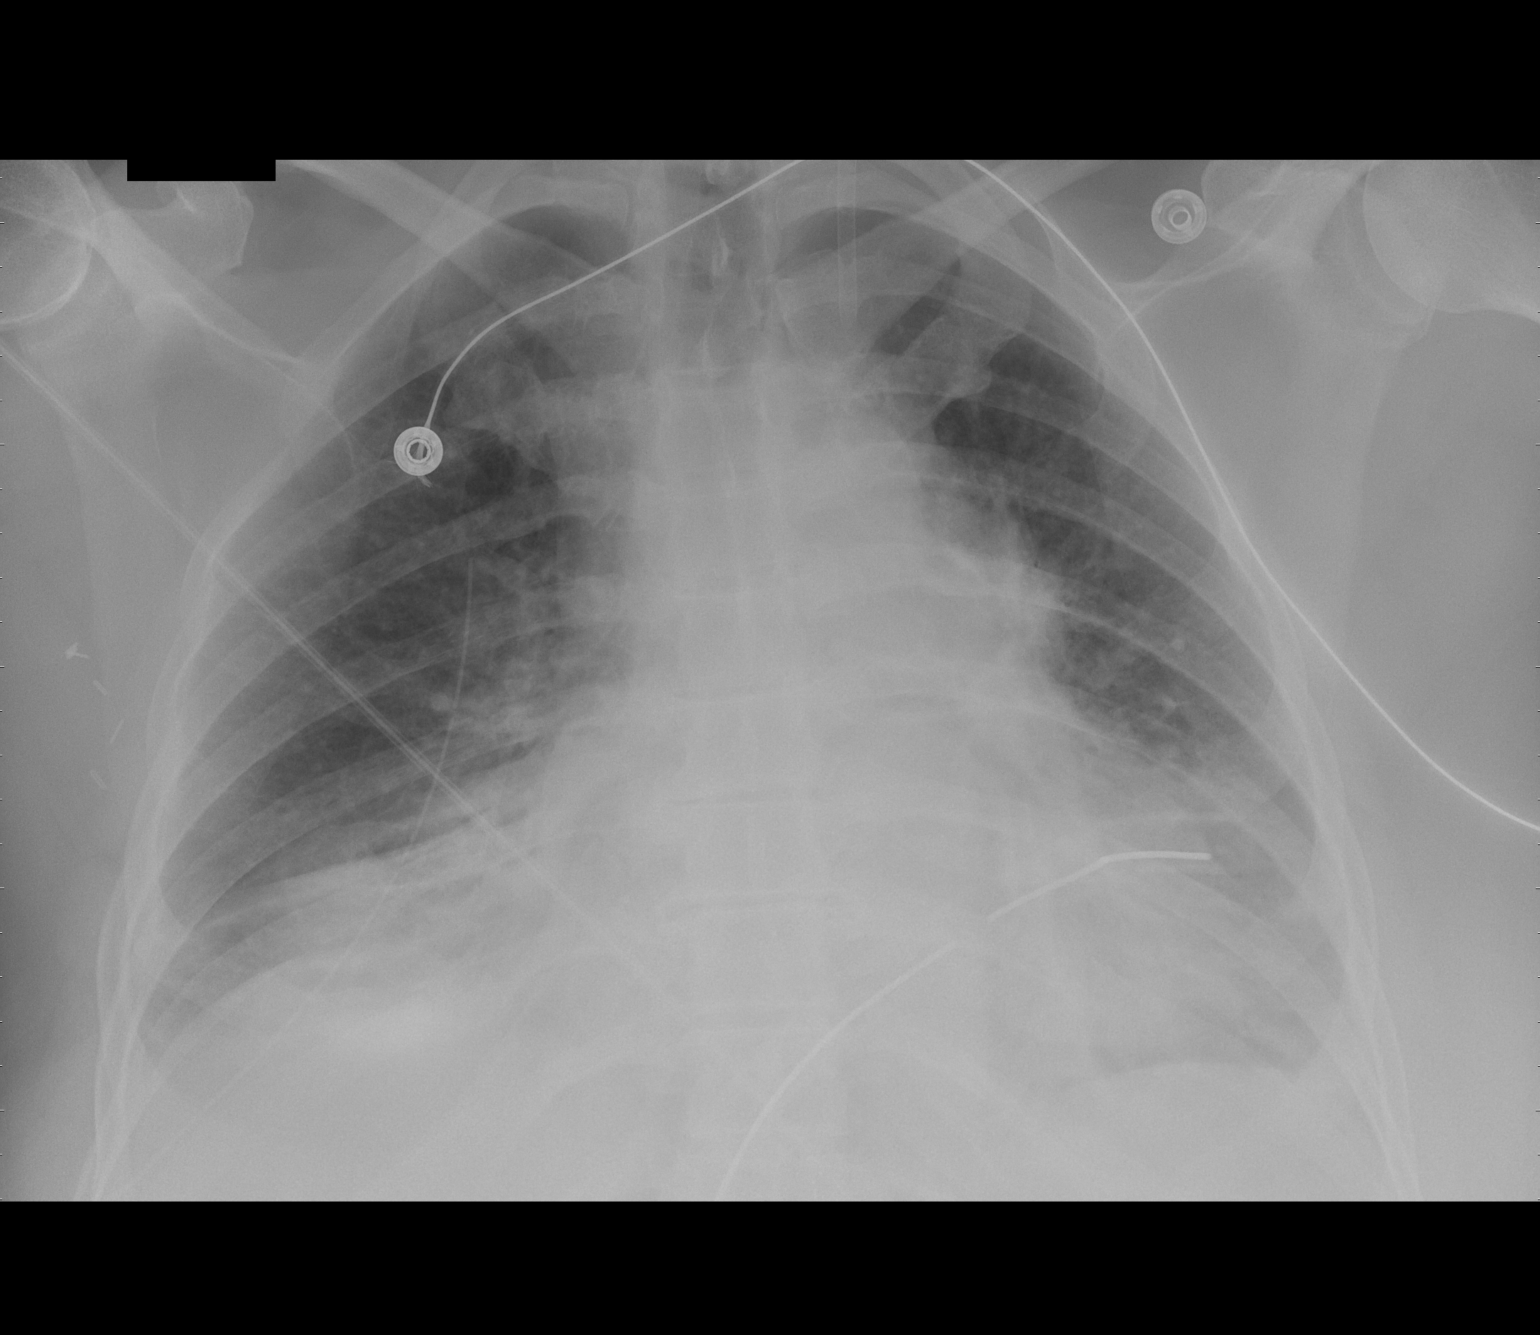

[1 of 1 positions shown; findings below may reference images not displayed]

FINDINGS: Trachea is midline. Cardiopericardial silhouette is
slightly more prominent after pericardial window procedure.  Left
IJ catheter sheath is in place. Right chest tube is in place.
Bibasilar air space disease with interval decrease in size of right
pleural effusion.  No pneumothorax.
IMPRESSION: 1.  Interval pericardial window with drain in place.
2.  Decrease in size of right pleural effusion with right chest
tube in place.  No pneumothorax.
3.  Bibasilar air space disease.

## 2013-03-25 MED ORDER — INV-EVEROLIMUS (RAD0001) 5MG TABLET NOVARTIS CRAD001Y24135: 2.0000 | ORAL_TABLET | Freq: Every day | ORAL | Status: DC

## 2013-03-25 NOTE — Telephone Encounter (Signed)
Pt is aware that i emailed KK for a time slot on 9/25. Pt is also aware that cs will call her w/ appts for her CT's. Pt know that i will call her when kk responds...td

## 2013-03-25 NOTE — Progress Notes (Signed)
OFFICE PROGRESS NOTE  CC  [name], MD [address] [place] [address]  DIAGNOSIS: 62 year old female with metastatic breast cancer with bone metastasis and lung metastasis.  PRIOR THERAPY:  #1 patient originally presented when she was 62 years old with early stage breast cancer in 2005. At that time she underwent a lumpectomy with axillary lymph node dissection. She was followed by Dr. Pierce Crane and Theron Arista young. Her last visit to Dr. Renelda Loma office was in 2006. Because of difficulties with insurance she had not been receiving mammograms.  #2 in 2012 patient began having shortness of breath on exertion. She was seen by pulmonary service and a CT scan showed some minor mediastinal adenopathy as well as borderline lower lobe nodule. PET scan was not performed. October 2012 patient began to experience more shortness of breath. Initially she was told that she had pneumonia. However subsequently she developed lower extremity edema. CT of the chest was obtained that showed a large pleural effusion in the right side as well as pericardial effusion. She was seen by cardiothoracic surgery placed in 09/11/2011. The cytology revealed that patient indeed have recurrent breast cancer that was hormone receptor positive HER-2/neu was not obtained as it was insufficient tissue. She subsequently had a CT scan performed that showed 2 nodules in the lung suspicious for metastatic disease as well as left lower lobe collapse. She had a biopsy of the lungs performed on 09/11/2011 the tumor was estrogen receptor +91% progesterone receptor +100% proliferation marker Ki-67 35%.  #3 patient was seen by Dr. Pierce Crane and he recommended that she be enrolled on the BOLERO-4 clinical trial utilizing letrozole 2.5 mg per day and affinitor10 mg per day. She initiated this on 10/10/2011.she has had a significant response to this lower extremity edema has reduced significantly in size she is no longer short  of breath.  CURRENT THERAPY:BOLERO -4 with everolimus 10 mg and letrozole 2.5 mg daily. Patient will begin cycle 20 day 1 today. ( 28 day cycles)  INTERVAL HISTORY: Barbara Parks 62 y.o. female returns for followup visit prior to cycle 20.  Her main complaints include fatigue, and the arthritis in her fingers.  The hot flashes, forgetfulness, nausea, diarrhea, and cataracts are stable.  Her headaches have resolved.  She is taking the Everolimus and Letrozole daily and doing well. Patient is complaining of swelling in the left ankle and hands this may be due to the letrozole. However especially in the lower extremity I would like to do a Doppler ultrasound I will order this. Otherwise a 10 point ROS is neg.   MEDICAL HISTORY: Past Medical History  Diagnosis Date  . Asthma   . Shortness of breath   . Headache(784.0)     OTC Prilosec  . Arthritis     left hip  . Contact dermatitis     from a bandaid that had Latex   . breast ca 2005    breast/chemo R mastectomy  . Metastasis to lung dx'd 08/2011    ALLERGIES:  is allergic to aspirin; latex; and nsaids.  MEDICATIONS:  Current Outpatient Prescriptions  Medication Sig Dispense Refill  . cholecalciferol (VITAMIN D) 1000 UNITS tablet Take 2,000 Units by mouth daily.       Marland Kitchen gemfibrozil (LOPID) 600 MG tablet Take 1 tablet (600 mg total) by mouth 2 (two) times daily before a meal.  60 tablet  1  . ibuprofen (ADVIL,MOTRIN) 200 MG tablet Take 400 mg by mouth every 6 (six)  hours as needed. For pain      . Investigational everolimus (RAD001) 5 MG tablet Nov[employer]is [id number] Take 2 tablets by mouth daily. Take with a glass of water.  70 tablet  0  . letrozole (FEMARA) 2.5 MG tablet Take 1 tablet (2.5 mg total) by mouth daily.  90 tablet  6  . loperamide (IMODIUM) 2 MG capsule Take 2 mg by mouth 4 (four) times daily as needed for diarrhea or loose stools.      . metoprolol tartrate (LOPRESSOR) 25 MG tablet Take 0.5 tablets (12.5 mg total) by  mouth daily.  30 tablet  4  . prochlorperazine (COMPAZINE) 10 MG tablet Take 10 mg by mouth every 6 (six) hours as needed. For nausea       No current facility-administered medications for this visit.    SURGICAL HISTORY:  Past Surgical History  Procedure Laterality Date  . Spine surgery  1996  . Breast surgery  2005    right  . Video bronchoscopy  09/11/2011    Procedure: VIDEO BRONCHOSCOPY;  Surgeon: Alleen Borne, MD;  Location: [facility] OR;  Service: Thoracic;  Laterality: N/A;  . Chest tube insertion  09/11/2011    Procedure: INSERTION PLEURAL DRAINAGE CATHETER;  Surgeon: Alleen Borne, MD;  Location: [facility] OR;  Service: Thoracic;  Laterality: Right;  . Pericardial window  09/11/2011    Procedure: PERICARDIAL WINDOW;  Surgeon: Alleen Borne, MD;  Location: [facility] OR;  Service: Thoracic;  Laterality: N/A;  . Removal of pleural drainage catheter  12/19/2011    Procedure: REMOVAL OF PLEURAL DRAINAGE CATHETER;  Surgeon: Alleen Borne, MD;  Location: [facility] OR;  Service: Thoracic;  Laterality: Right;  TO BE DONE IN MINOR ROOM, SHORT STAY    REVIEW OF SYSTEMS:  General: fatigue (+), night sweats (-), fever (-), pain (-) Lymph: palpable nodes (-) HEENT: vision changes (-), mucositis (-), gum bleeding (-), epistaxis (-) Cardiovascular: chest pain (-), palpitations (-) Pulmonary: shortness of breath (-), dyspnea on exertion (-), cough (-), hemoptysis (-) GI:  Early satiety (-), melena (-), dysphagia (-), nausea/vomiting (-), diarrhea (-) GU: dysuria (-), hematuria (-), incontinence (-) Musculoskeletal: joint swelling (-), joint pain (+), back pain (-) Neuro: weakness (-), numbness (-), headache (-), confusion (-) Skin: Rash (-), lesions (-), dryness (-) Psych: depression (-), suicidal/homicidal ideation (-), feeling of hopelessness (-)   PHYSICAL EXAMINATION: Blood pressure 124/83, pulse 105, temperature 98 F (36.7 C), temperature source Oral, resp. rate 20, height 5\' 5"  (1.651 m), weight 183 lb 6.4  oz (83.19 kg). Body mass index is 30.52 kg/(m^2). General: Patient is a well appearing female in no acute distress HEENT: PERRLA, sclerae anicteric no conjunctival pallor, MMM Neck: supple, no palpable adenopathy Lungs: clear to auscultation bilaterally, no wheezes, rhonchi, or rales Cardiovascular: regular rate rhythm, S1, S2, no murmurs, rubs or gallops Abdomen: Soft, non-tender, non-distended, normoactive bowel sounds, no HSM Extremities: warm and well perfused, no clubbing, cyanosis, or edema Skin: No rashes or lesions Neuro: Non-focal ECOG PERFORMANCE STATUS: 1 - Symptomatic but completely ambulatory    LABORATORY DATA: Lab Results  Component Value Date   WBC 6.3 03/25/2013   HGB 14.7 03/25/2013   HCT 44.5 03/25/2013   MCV 79.5 03/25/2013   PLT 235 03/25/2013      Chemistry      Component Value Date/Time   NA 142 03/25/2013 0933   NA 136 07/16/2012 0910   K 3.9 03/25/2013 0933   K 4.0 07/16/2012 0910  CL 109* 12/31/2012 0940   CL 103 07/16/2012 0910   CO2 19* 03/25/2013 0933   CO2 21 07/16/2012 0910   BUN 17.2 03/25/2013 0933   BUN 16 07/16/2012 0910   CREATININE 1.0 03/25/2013 0933   CREATININE 0.75 07/16/2012 0910      Component Value Date/Time   CALCIUM 10.2 03/25/2013 0933   CALCIUM 10.1 07/16/2012 0910   ALKPHOS 195* 03/25/2013 0933   ALKPHOS 142* 07/16/2012 0910   AST 31 03/25/2013 0933   AST 30 07/16/2012 0910   ALT 36 03/25/2013 0933   ALT 33 07/16/2012 0910   BILITOT 0.34 03/25/2013 0933   BILITOT 0.2* 07/16/2012 0910       RADIOGRAPHIC STUDIES: CT CHEST, ABDOMEN AND PELVIS WITH CONTRAST  Technique: Contiguous axial images of the chest abdomen and pelvis  were obtained after IV contrast administration.  Contrast: 100 ml Omnipaque-300  Comparison: 11/03/2012  CT CHEST  RECIST protocol 1.0  Target Lesions:  1.Right lower lobe lung "nodule" 5 mm on image 28/series 5 versus 6  mm on the prior.  2. Subcarinal lymph node 9 mm short axis on image 28/series 2   versus 8 mm on the prior.  3. Right hilar node 9 mm on image 26/series 2 versus similar on  the prior exam (when remeasured).  Non Target lesions:  1. Findings suspicious for osseous metastasis. Similar.  Findings: Lung windows demonstrate mild scarring at the right lung  base. Clear left lung.  Soft tissue windows demonstrate no supraclavicular adenopathy.  Right mastectomy and axillary nodal dissection. No axillary  adenopathy. Tortuous descending thoracic aorta. Heart size upper  normal, without pericardial effusion. Trace right-sided pleural  thickening is unchanged. No central pulmonary embolism, on this non-  dedicated study. No mediastinal or hilar adenopathy. No internal  mammary adenopathy.  A sclerotic lesion involving the left posterior 11th rib is similar  on image 45/series 2. This is absent on 08/09/2010.  IMPRESSION:  1. Similar eleventh posterior left rib sclerosis, suspicious for  osseous metastasis.  2. No evidence of extra osseous metastasis within the chest.  CT ABDOMEN AND PELVIS  Findings: Moderate to marked hepatic steatosis. Similar vague  hyperenhancement the right lobe liver on image 52/series 2, likely  perfusion anomaly. There is sparing of steatosis adjacent the  gallbladder. Normal spleen, stomach. Duodenal diverticulum  involves the transverse segment. Normal pancreas, gallbladder,  biliary tract, right gland, kidneys. Minimal left adrenal  nodularity is unchanged. No retroperitoneal or retrocrural  adenopathy.  Normal colon, appendix, and terminal ileum. Normal small bowel  without abdominal ascites.  No evidence of omental or peritoneal disease.  No pelvic adenopathy. Normal urinary bladder and uterus. No  adnexal mass. No significant free fluid.  Advanced left hip osteoarthritis. Vague sclerosis involving the  left iliac crest is unchanged. A well-defined sclerotic lesion in  the right iliac crest is similar. Subtle sclerosis in the anterior   left sacrum on image 94 is similar.  IMPRESSION:  1. Similar subtle osseous sclerosis, for which metastatic disease  cannot be excluded.  2. No extra osseous metastasis within the abdomen/pelvis.  3. Moderate to marked hepatic steatosis.      ASSESSMENT: 62 year old female with  #1 original diagnosis of breast cancer in 2005. She was treated with a lumpectomy followed by radiation. She apparently did go on antiestrogen therapy. Thereafter she was lost to follow up due to lack of finances and loss of insurance.    2 patient was seen by Dr. Donnie Coffin  in March 2013 with metastatic recurrent breast cancer that was ER positive PR positive with lung metastasis malignant pleural effusion and malignant pericardial effusion.she underwent pericardial window as well as Pleurx catheter placement.  #3 she was then begun on BOLERO 4 clinical trial consisting of everolimus 10 mg and letrozole 2.5 mg given on a daily basis on a 28 day cycle. So far she has completed 18 cycles. She is here for cycle number 19.   #4patient had CT scans performed on 12/29/2012.the scan does not show any progressive disease and clinically patient seems to be doing quite nicely.   #5 CT scan scan on 02/23/2013 reveal stable disease.    PLAN:   #1 patient will proceed with cycle 20 of her chemotherapy consisting of everolimus and letrozole.  #2 She will return in 4 weeks' time for followup (04/17/13  #3 follow up scans on 04/17/13  All questions were answered. The patient knows to call the clinic with any problems, questions or concerns. We can certainly see the patient much sooner if necessary.  I spent 25 minutes counseling the patient face to face. The total time spent in the appointment was 25 minutes.  Drue Second, MD Medical/Oncology [facility] [phone] (beeper) [phone] (Office)

## 2013-03-25 NOTE — Progress Notes (Signed)
03/25/13 at 2:28pm- [employer] 4- cycle 20, day 1 study notes- The pt was into the cancer center today for her study assessments.  She confirmed that she was fasting upon arrival to the cancer center.  She stated that she last ate at 6:30pm on 03/24/13.  She had her labs drawn for the study.  Dr. Welton Flakes reviewed the pt's lab values with her and she stated that her lab abnormals were not clinically significant.  The pt returned her cycle 19 everolimus drug kit for the drug accountability check.  The pt reported that she took her everolimus 10mg  ( 2 pills daily) from 03/02/13 through 03/24/13 (23 days).  The pt was dispensed 70 pills and she returned 24 unopened blister packs of everolimus.  Therefore, the pt took 46 pills (46 opened blister packs).  The pt is 100% compliant with her study drug (70-46=24).  The pt was thanked for her compliance with her study drug.  The pt also confirmed that she has taken her letrozole 2.5mg  daily along with everolimus from 03/02/13 through 03/24/13.  The pt reports a good activity level.  She works in her yard.  ECOG=1.  The pt was seen and examined by Dr. Welton Flakes.  Dr. Welton Flakes felt that the pt was tolerating her study drug well with no new adverse events.  The pt reports the following adverse events as ongoing:   Hot flashes, memory impairment, intermittent nausea and diarrhea, bilateral joint pain (hands, knees), bilateral cataracts, and fatigue.  She specifically denies any new medicines or changes to her medication dosages.  The research nurse reviewed each medicine with the pt for confirmation.  She reports the following medications as ongoing:  Lopressor, compazine prn, immodium prn, Lopid, ibuprofen prn, and vitamin D3.  The pt was dispensed her cycle 20 everolimus drug kit and instructed to keep taking her everolimus 2 pills daily along with her letrozole.  The pt verbalized understanding.  The pt was informed that she will need CT scans prior to her next cycle.

## 2013-03-26 ENCOUNTER — Telehealth: Payer: Self-pay | Admitting: *Deleted

## 2013-03-26 IMAGING — CR DG CHEST 1V PORT
1 series · 1 of 1 positions shown · non-contrast
Comparison: 09/11/2011

CLINICAL DATA: History of video bronchoscopy with insertion of
pleural drainage catheter and pericardial window.

PORTABLE CHEST - 1 VIEW

[AP]
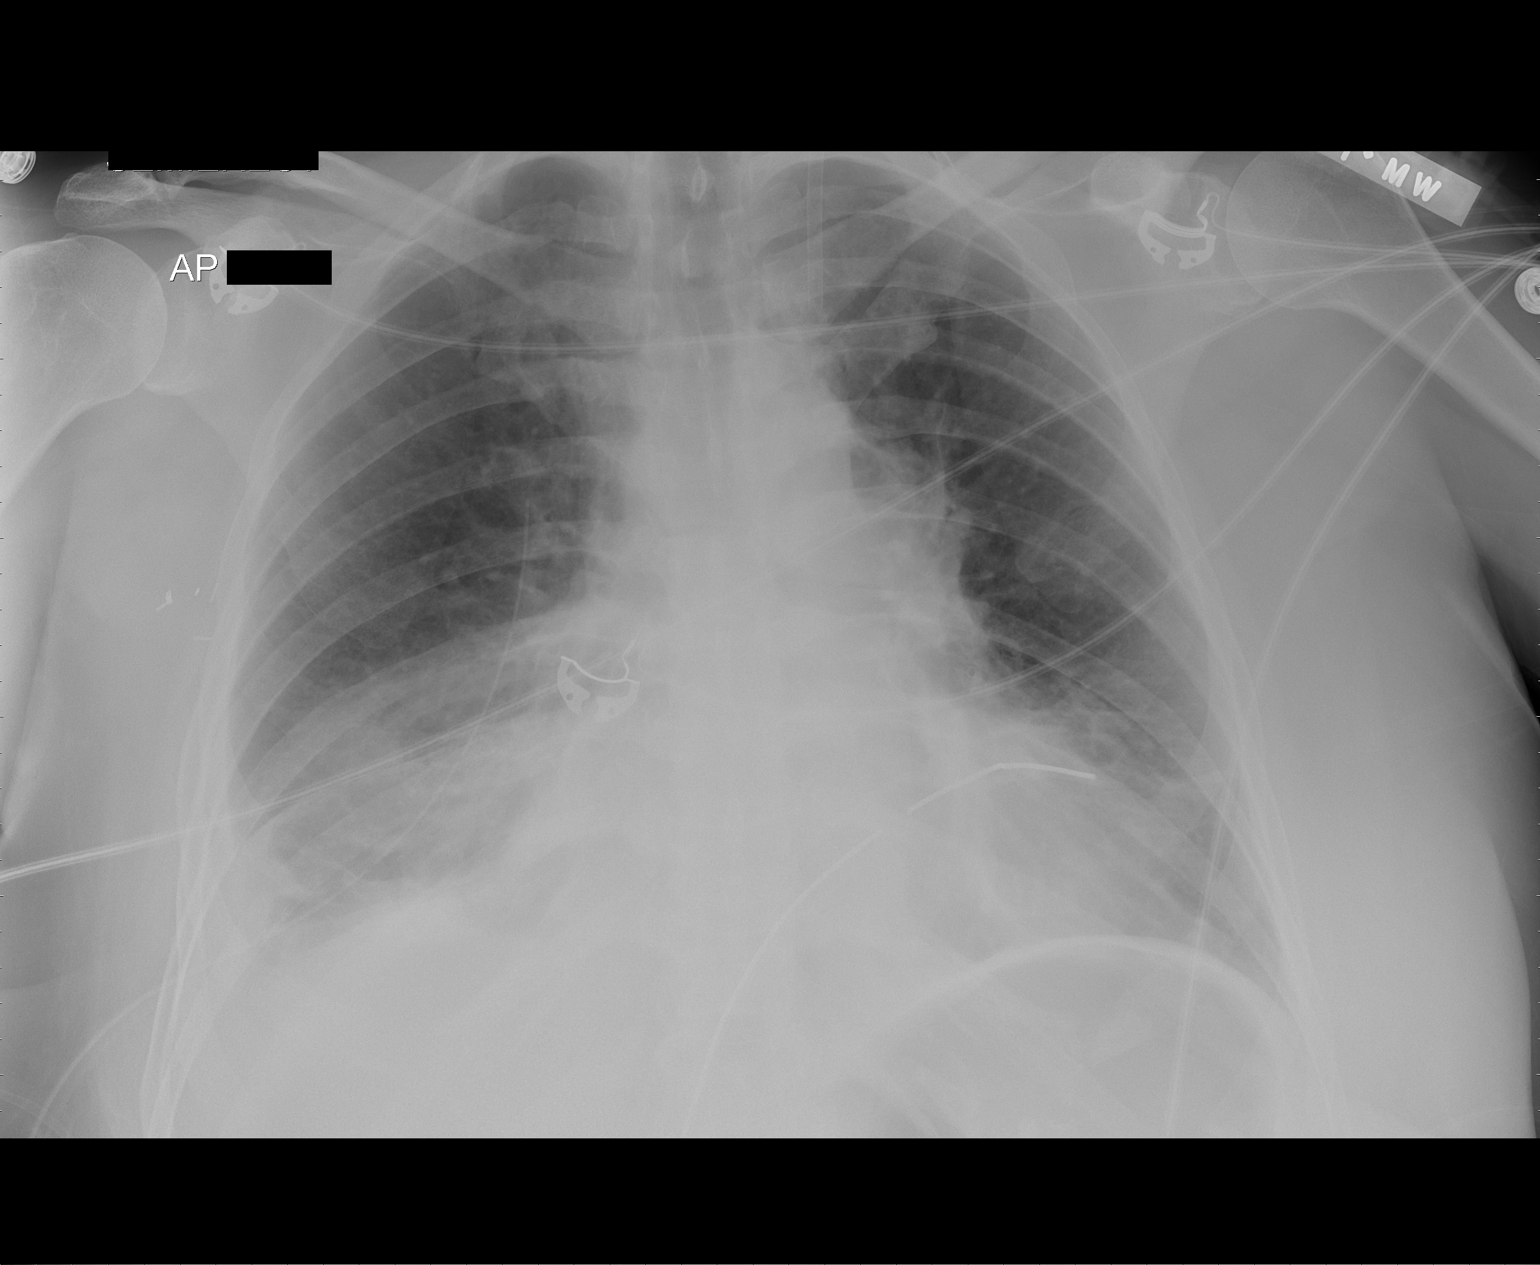

[1 of 1 positions shown; findings below may reference images not displayed]

FINDINGS: Stable position of the chest drains.  There is no
evidence for a pneumothorax.  Stable hazy densities at the right
lung base could represent areas of volume loss and residual pleural
fluid/thickening.  There is a left jugular central venous catheter
still in place.  The trachea is midline.  Heart size is grossly
stable.
IMPRESSION: Stable position of the chest drains.  No evidence for a large
pneumothorax.

Persistent pleural and parenchymal densities at the right lung
base.

## 2013-03-26 NOTE — Telephone Encounter (Signed)
Lm gv appt for 9/25 w/ labs@ 8am and ov @8 :30am....td

## 2013-03-27 IMAGING — CR DG CHEST 1V PORT
1 series · 1 of 1 positions shown · non-contrast
Comparison: Portable chest x-ray of 09/12/2011

CLINICAL DATA: Right pleurex catheter placed, pericardial window

PORTABLE CHEST - 1 VIEW

[view not recorded]
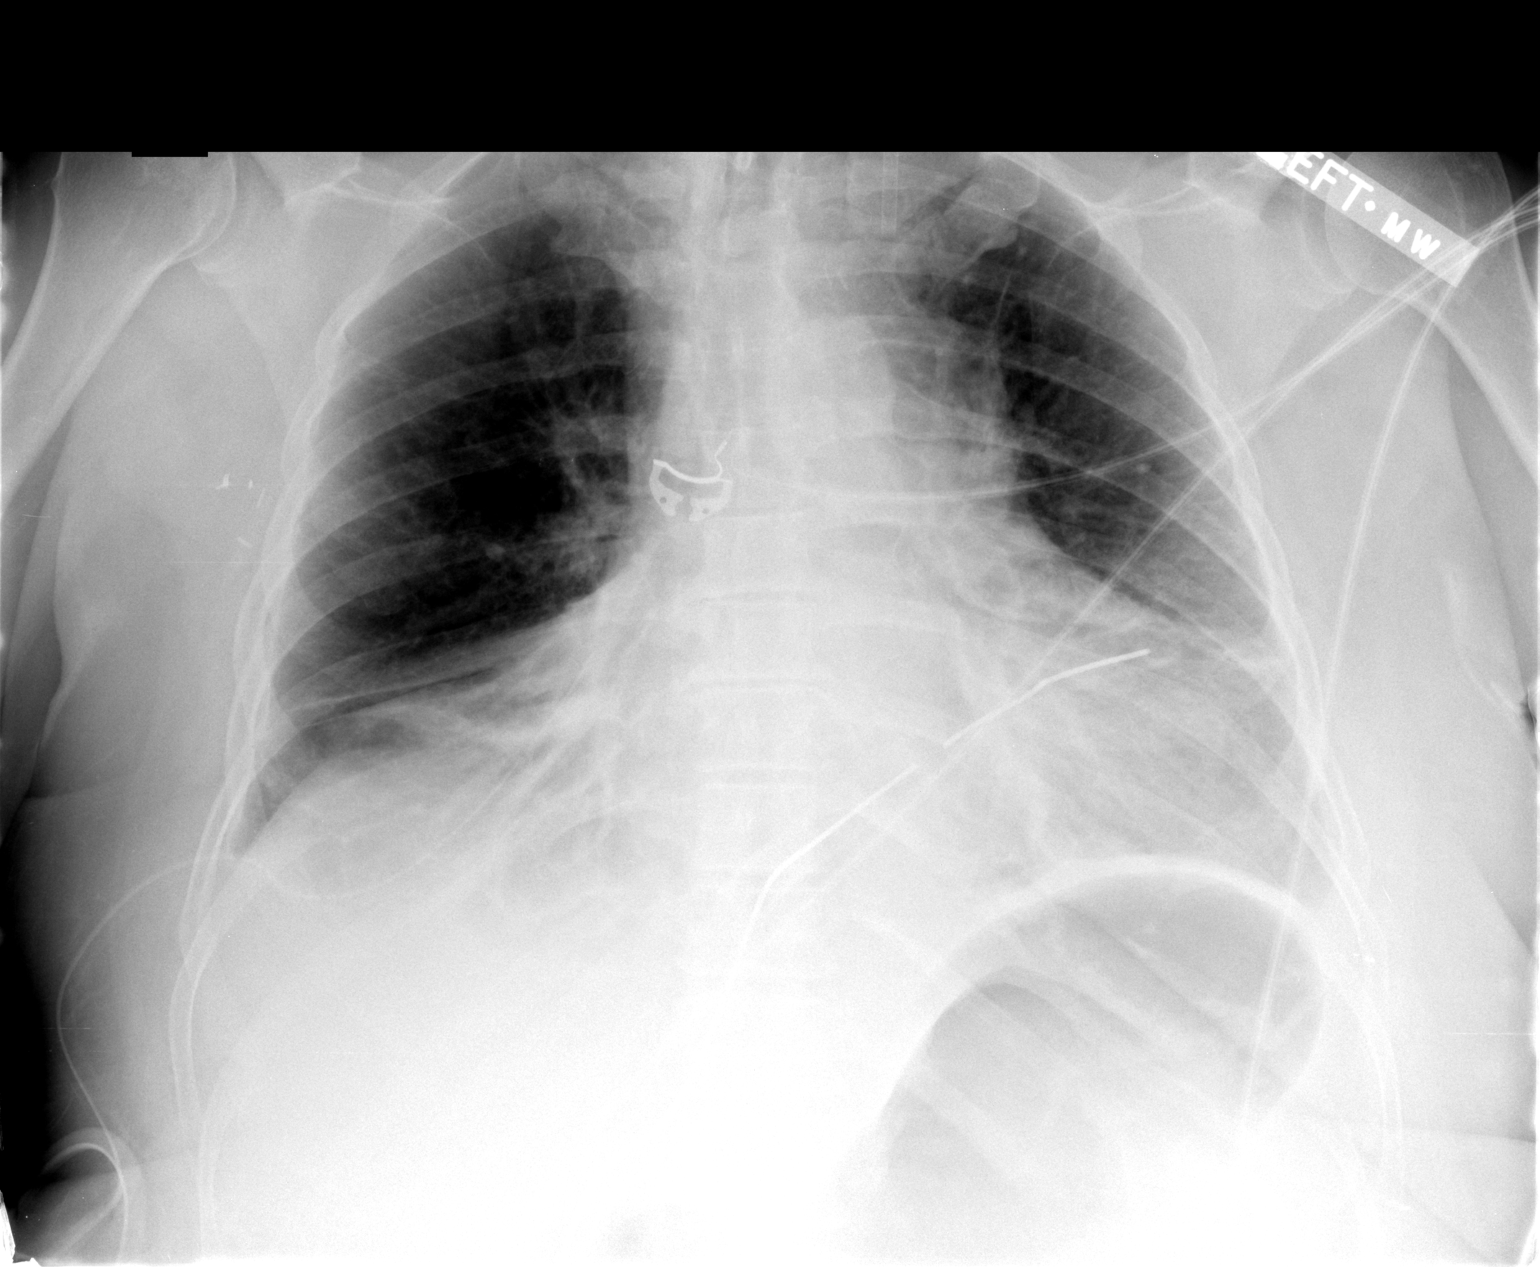

[1 of 1 positions shown; findings below may reference images not displayed]

FINDINGS: Aeration of the lung bases has improved.  A right pleurex
catheter is present.  There is basilar atelectasis remaining.  A
left chest tube is present.  A venous sheath remains in the left
innominate vein.  No pneumothorax is seen.  Cardiomegaly is stable.
IMPRESSION: Somewhat improved aeration.  Pleurex catheter remains in the right
lung base.

## 2013-04-13 IMAGING — CR DG CHEST 2V
2 series · 2 of 2 positions shown · non-contrast
Comparison: 09/13/2011 and previous

CLINICAL DATA: Acute pericarditis.

CHEST - 2 VIEW

[w chest pa]
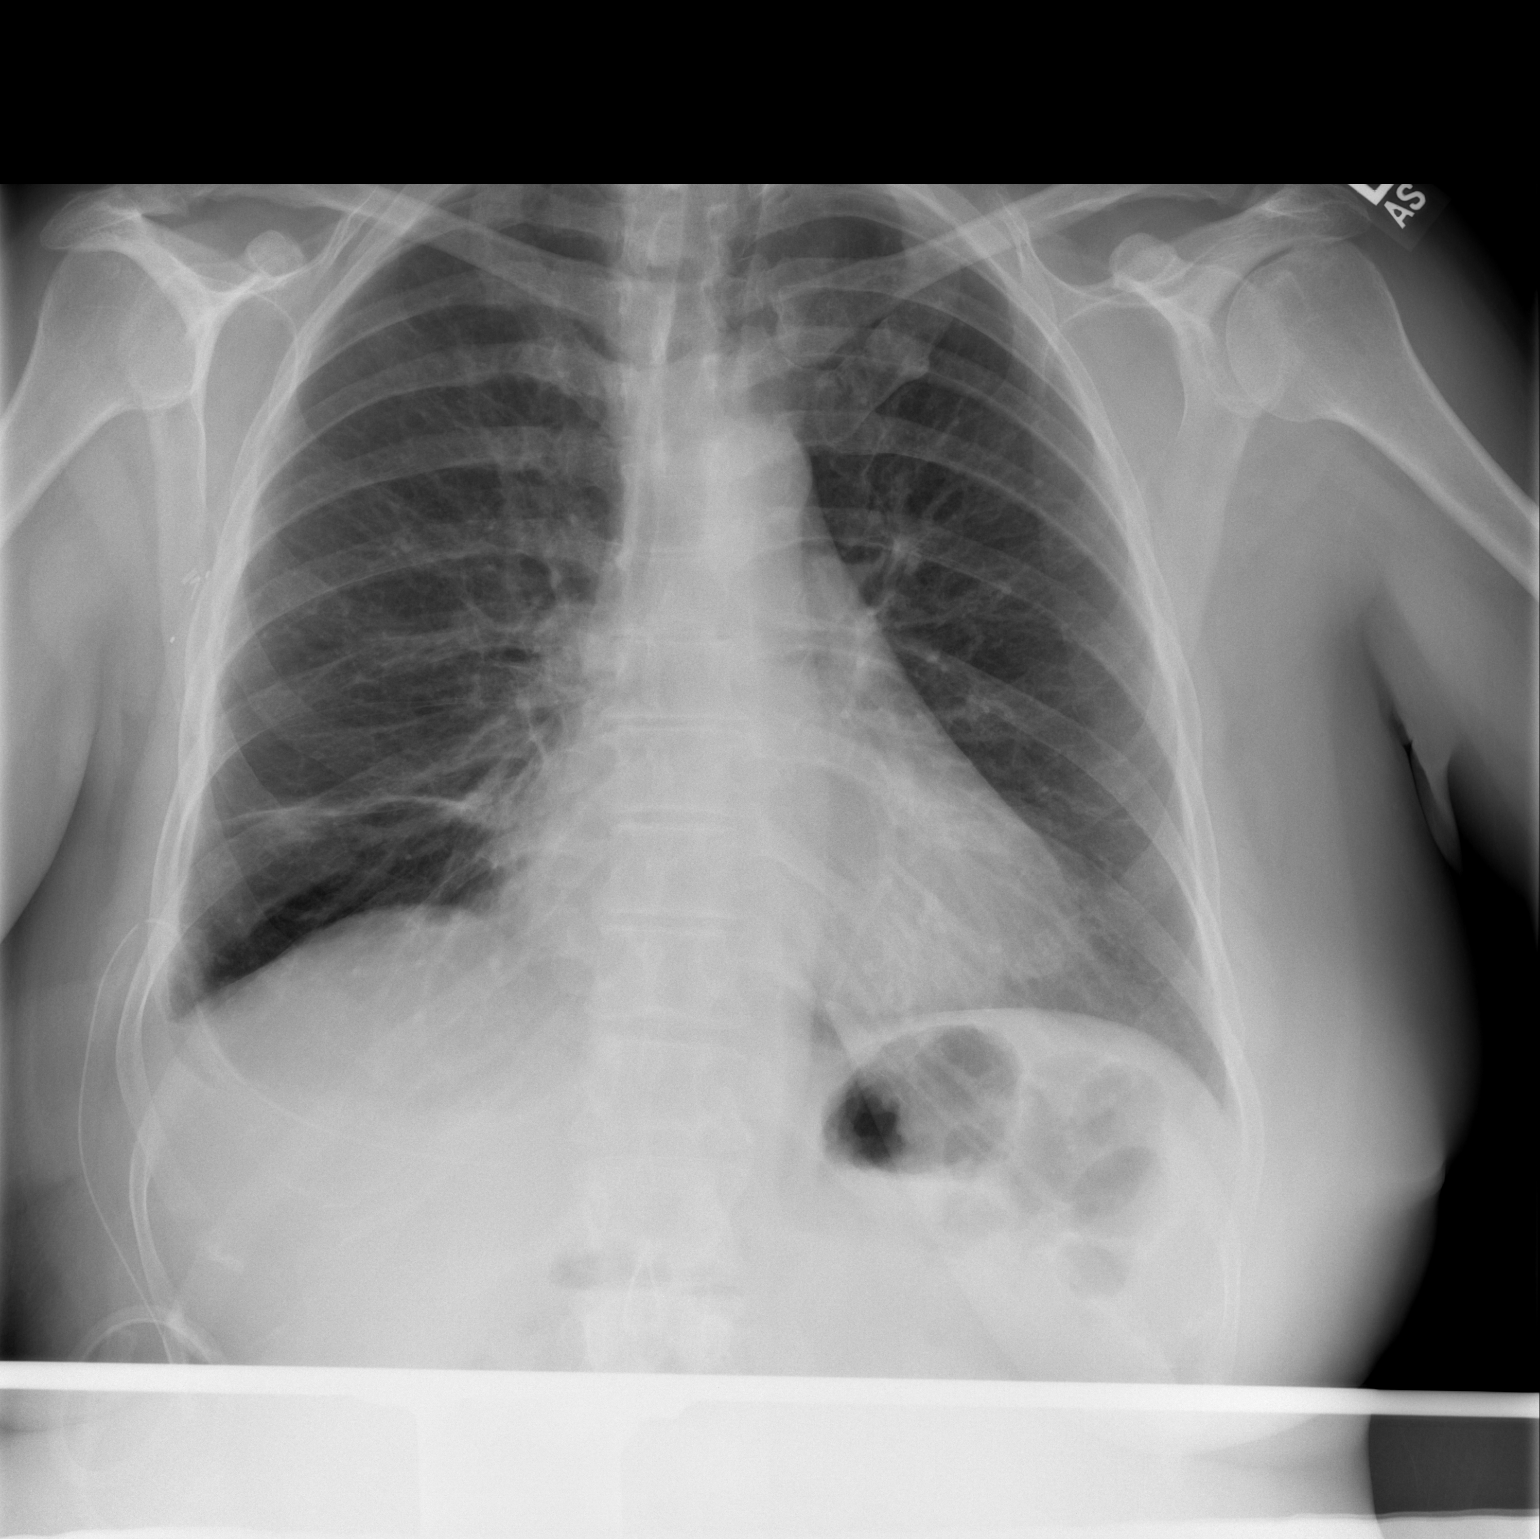

[w chest lat]
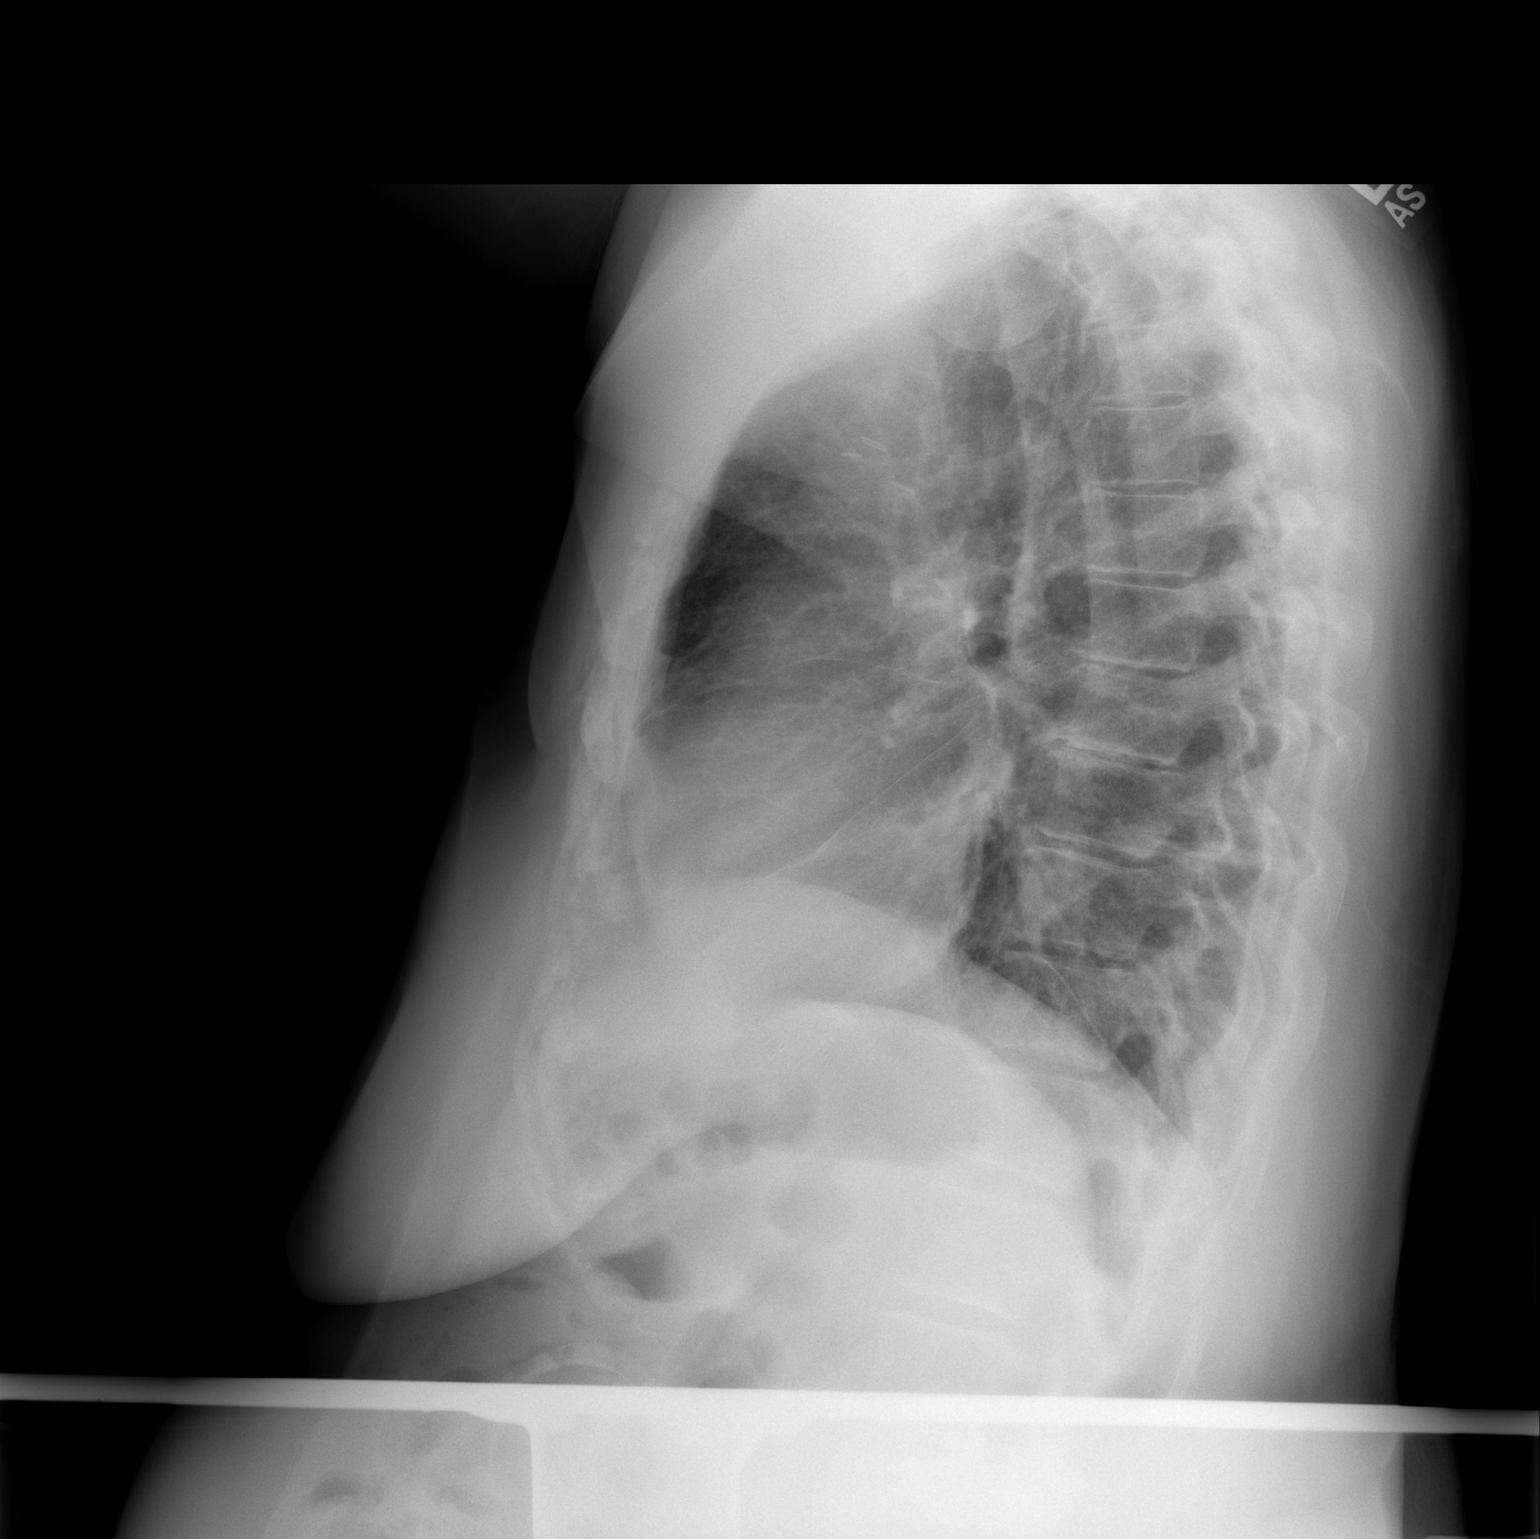

[2 of 2 positions shown; findings below may reference images not displayed]

FINDINGS: As chest tube in place on the right.  No pneumothorax.
Minor pleural blunting.  Minor linear atelectasis at the right
base.  Left lung is clear.  Heart size is normal.
IMPRESSION: Chest tube in place at the right base.  Minimal pleural blunting
and minimal right base atelectasis.  No pneumothorax.

## 2013-04-16 IMAGING — MR MR HEAD WO/W CM
7 of 11 series · 27 of 48 positions shown · IV contrast (multihance)
Comparison: None.

CLINICAL DATA: Malignant neoplasm of the breast.  174.9.  Staging
protocol.

MRI HEAD WITHOUT AND WITH CONTRAST
TECHNIQUE: Multiplanar, multiecho pulse sequences of the brain and
surrounding structures were obtained according to standard protocol
without and with intravenous contrast
Contrast: 17mL MULTIHANCE GADOBENATE DIMEGLUMINE 529 MG/ML IV SOLN

[Series 4: T2 · axial · 5.0mm · 0.43mm/px · z∈[-44,+105]mm · 3 of 24 slices shown]
[im 1/24]
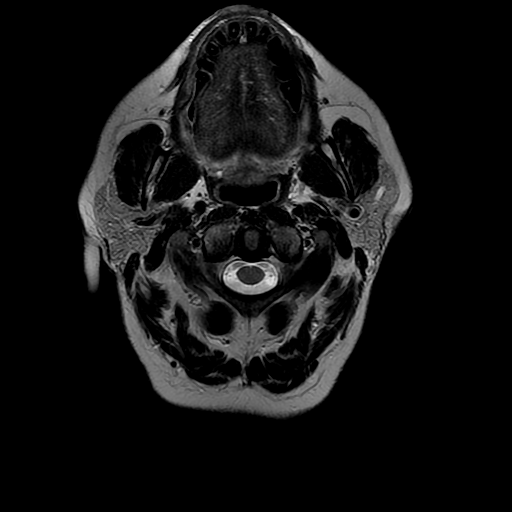
[im 12/24]
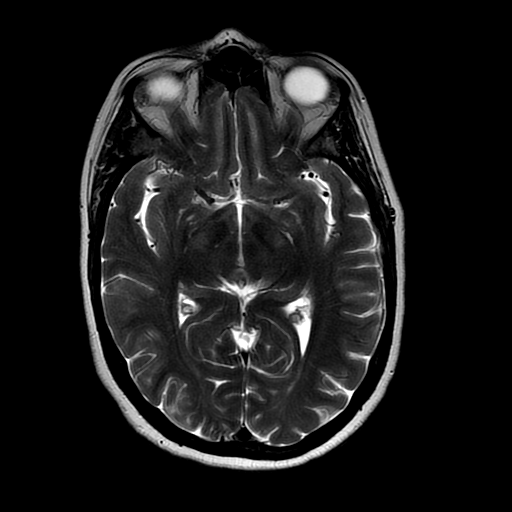
[im 24/24]
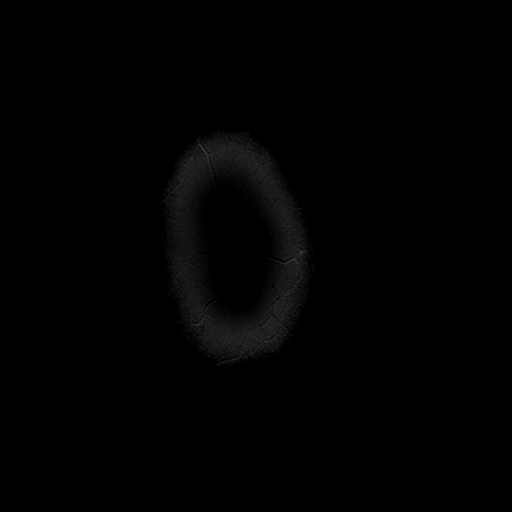

[Series 5: DWI · axial · 5.0mm · 1.09mm/px · z∈[-36,+109]mm · 8 of 60 slices shown (1 of 2)]
[im 1/60]
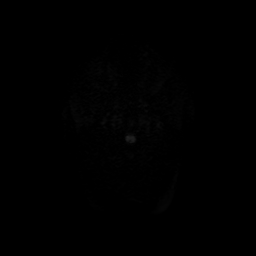
[im 9/60]
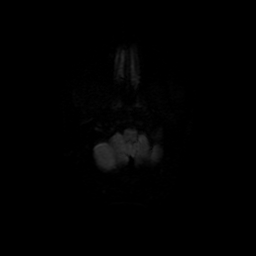
[im 17/60]
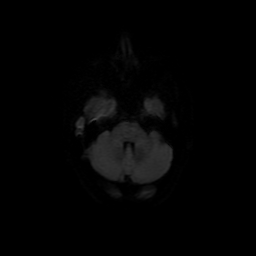
[im 26/60]
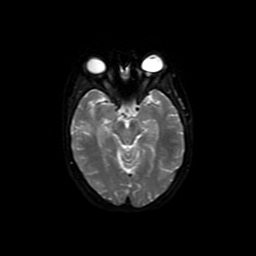
[im 34/60]
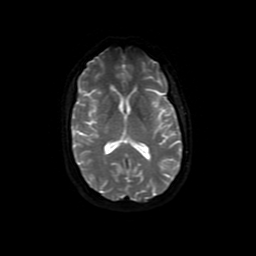
[im 43/60]
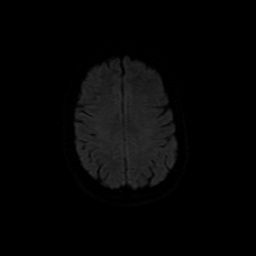
[im 51/60]
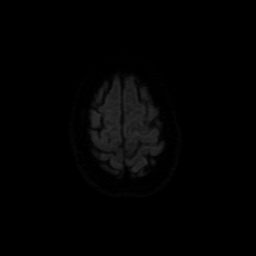
[im 60/60]
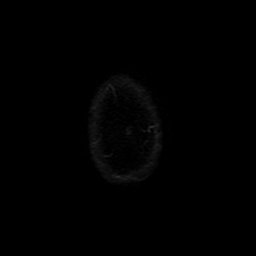

[Series 6: FLAIR · axial · 5.0mm · 0.43mm/px · z∈[-42,+104]mm · 3 of 22 slices shown]
[im 1/22]
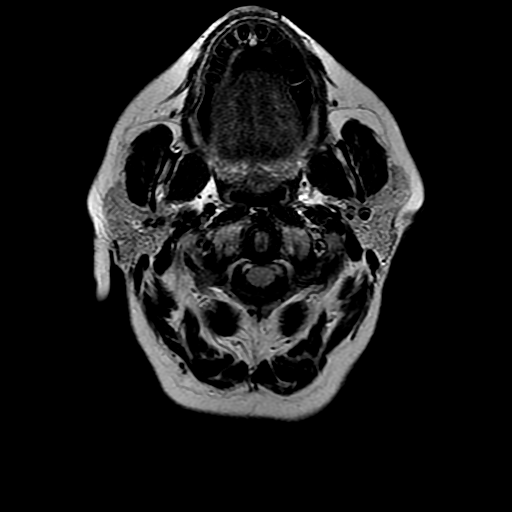
[im 11/22]
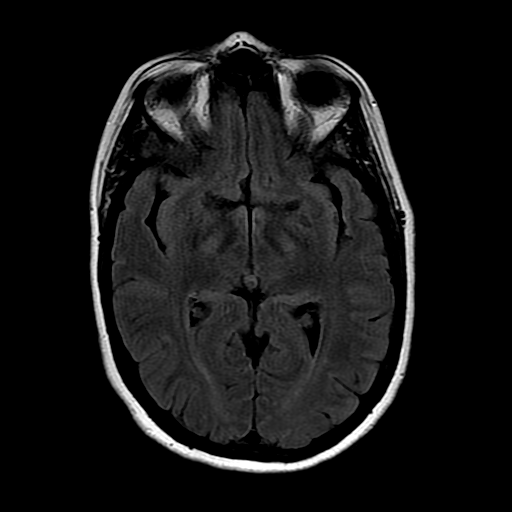
[im 22/22]
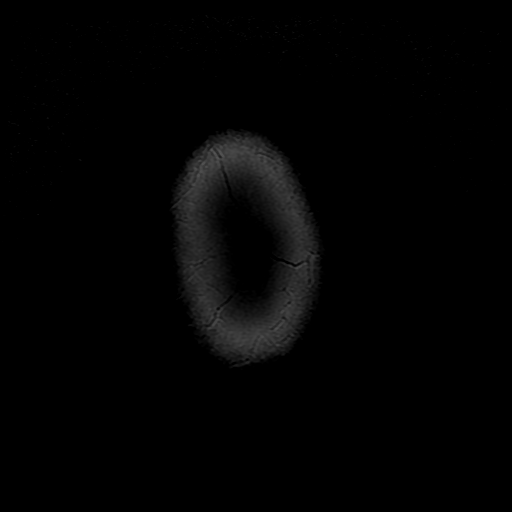

[Series 9: T2 post-contrast · coronal · 5.0mm · 0.45mm/px · 3 of 26 slices shown]
[im 1/26]
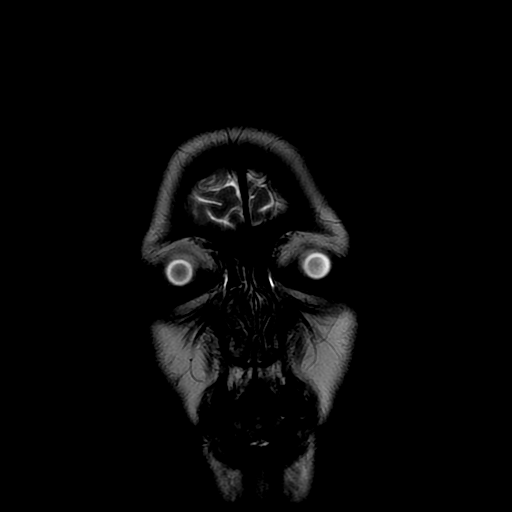
[im 13/26]
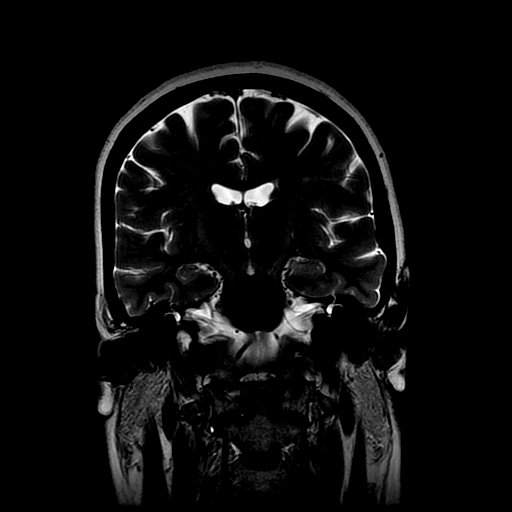
[im 26/26]
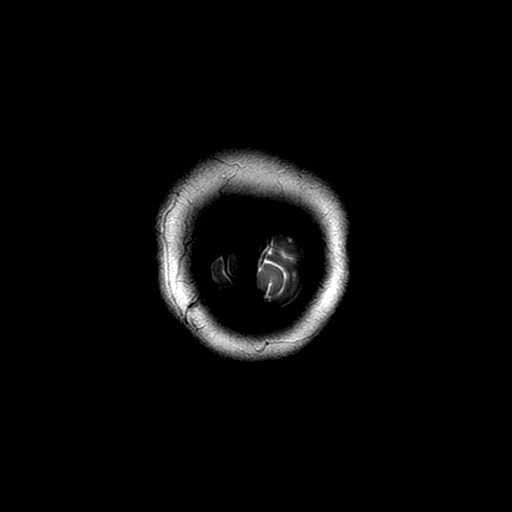

[Series 11: T1 post-contrast · coronal · 5.0mm · 0.45mm/px · 3 of 26 slices shown (1 of 2)]
[im 1/26]
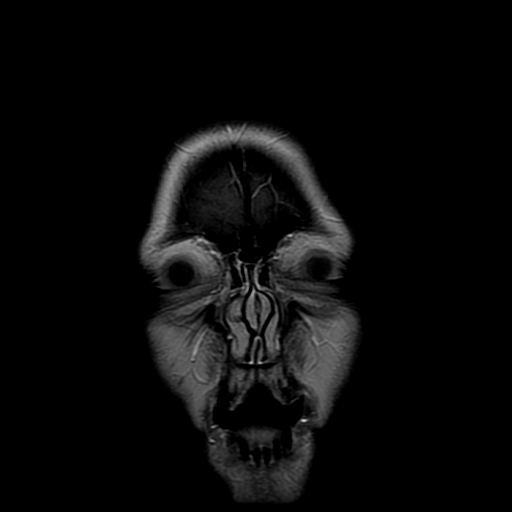
[im 13/26]
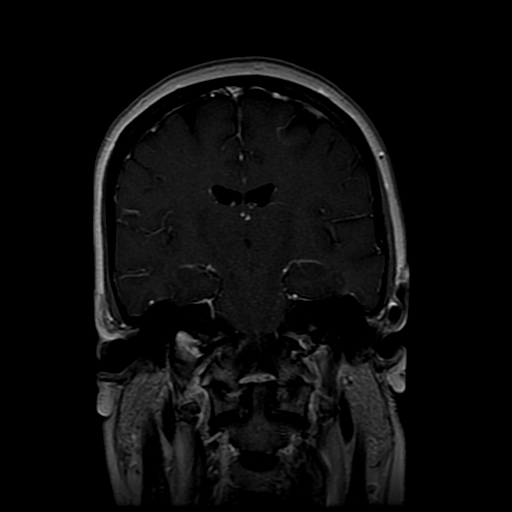
[im 26/26]
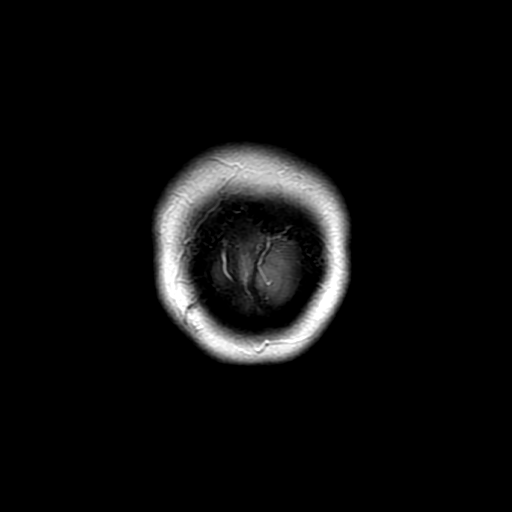

[Series 12: T1 post-contrast · sagittal · 5.0mm · 0.47mm/px · 3 of 22 slices shown (2 of 2)]
[im 1/22]
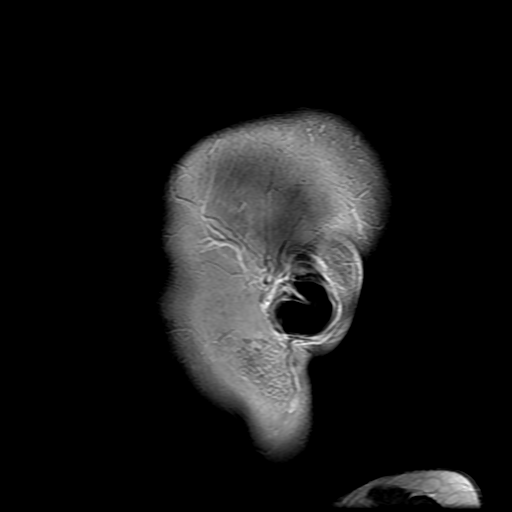
[im 11/22]
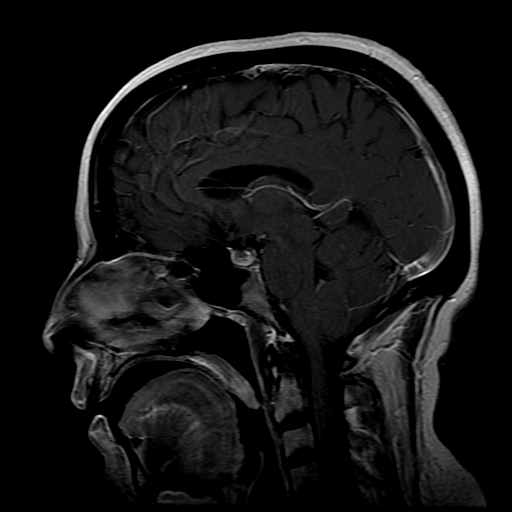
[im 22/22]
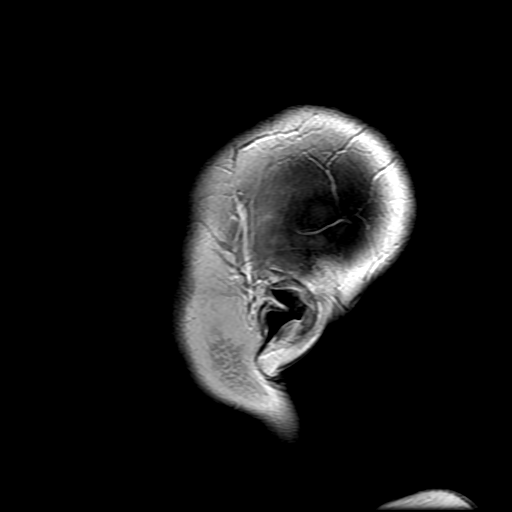

[Series 500: DWI · axial · 5.0mm · 1.09mm/px · z∈[-36,+109]mm · 4 of 30 slices shown (2 of 2)]
[im 1/30]
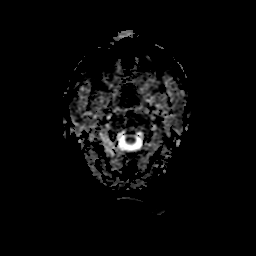
[im 10/30]
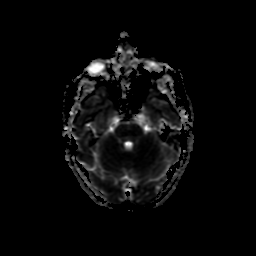
[im 20/30]
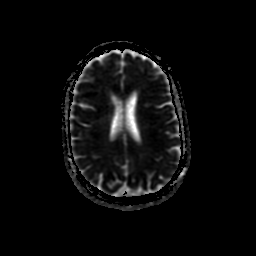
[im 30/30]
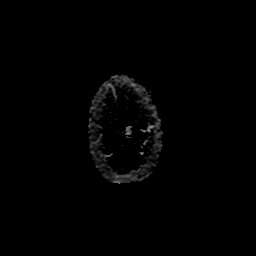

[27 of 48 positions shown; findings below may reference images not displayed]

FINDINGS: The postcontrast images demonstrate a 3.5 mm lesion in
the superior vermis as measured on an image 11 of series 12, in the
sagittal plane.  An additional area of enhancement is present in
the left cerebellum measuring approximately 5 mm on image 14 of
series 10 in the axial plane.

A linear area of enhancement in the anterior left frontal lobe
superiorly is compatible with a benign venous angioma.  No other
focal parenchymal enhancement is present.

No acute infarct or hemorrhage is present.  The ventricles are
normal size.  No significant extra-axial fluid collection is
evident.  Scattered subcortical white matter T2 and FLAIR
hyperintensities are slightly advanced for age.

The cerebellar tonsils extend slightly below the foramen magnum.
There remain rounded.  This does not meet criteria for a Chiari
malformation.

Flow is present in the major intracranial arteries.  The globes and
orbits are intact.  The paranasal sinuses and mastoid air cells are
clear.
IMPRESSION: 1.  Two small areas of enhancement within the cerebellum, one on
the left and one in the superior vermis raise concern for possible
metastatic disease.  No comparison MRI is available.  Recommend
close attention to both areas.
2.  Mild cerebellar tonsillar ectopia without a significant Chiari
malformation.
3.  Subcortical white matter disease is slightly advanced for age.
The finding is nonspecific but can be seen in the setting of
chronic microvascular ischemia, a demyelinating process such as
multiple sclerosis, vasculitis, complicated migraine headaches, or
as the sequelae of a prior infectious or inflammatory process.

## 2013-04-16 IMAGING — CT CT CHEST W/ CM
2 of 5 series · 15 of 46 positions shown, 17 images · IV contrast ([ID] [id number])
Comparison: Plain film chest of 09/30/2011.

CLINICAL DATA: Staging of breast cancer.  Diagnosed in 3551 and
9009.  Chemotherapy completed in 1338.  Right mastectomy.  Cough.
Shortness of breath.  No abdominal pelvic complaints.  Ex-smoker.
Right-sided pleural fluid demonstrating malignancy.  Recent
pericardial window and pleurex catheter placement.

CT CHEST, ABDOMEN AND PELVIS WITH CONTRAST
TECHNIQUE: Contiguous axial images of the chest abdomen and pelvis
were obtained after IV contrast administration.
Contrast: 100  ml Vmnipaque-XZZ

[Series 2: cap with st · axial · 0.79mm/px · z∈[-572,-37]mm · 12 of 123 slices shown, 14 images]
[im 8/123  soft-tissue]
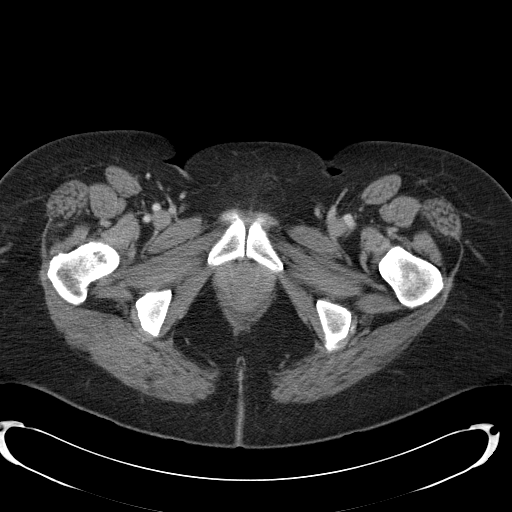
[im 8/123  bone]
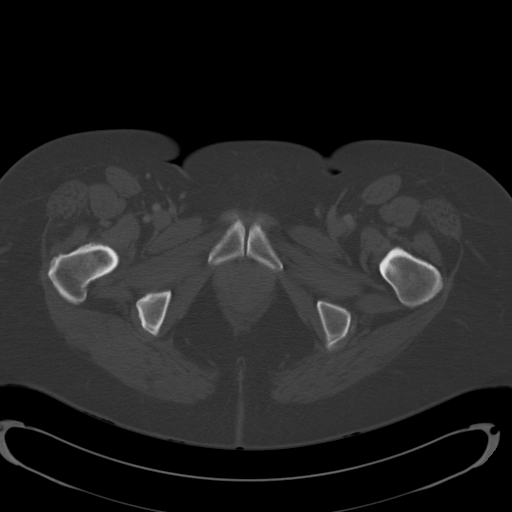
[im 22/123  soft-tissue]
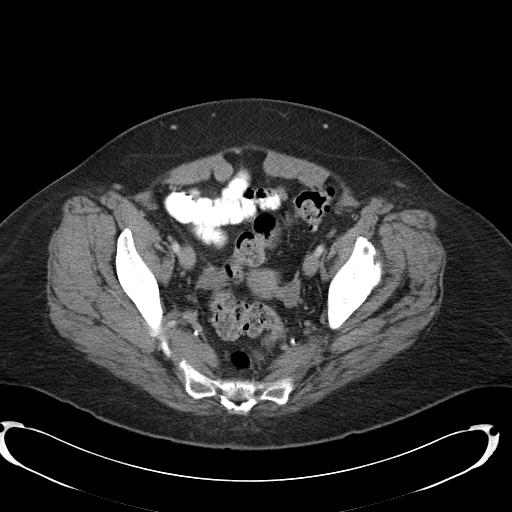
[im 29/123  soft-tissue]
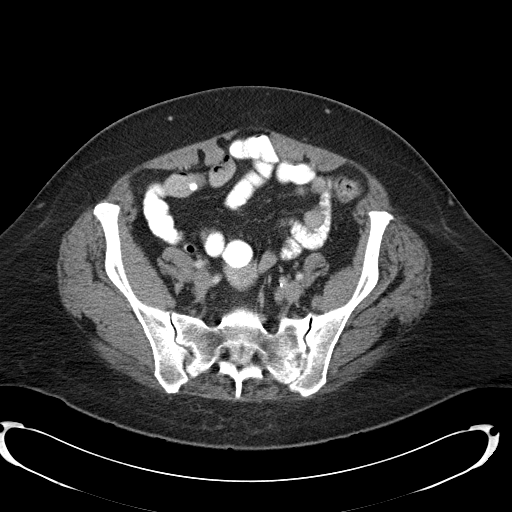
[im 36/123  soft-tissue]
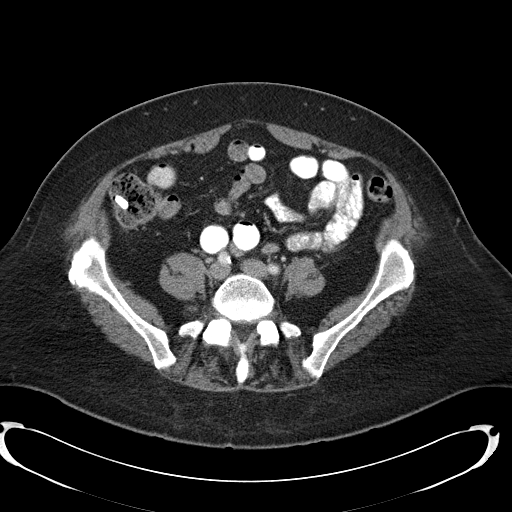
[im 51/123  soft-tissue]
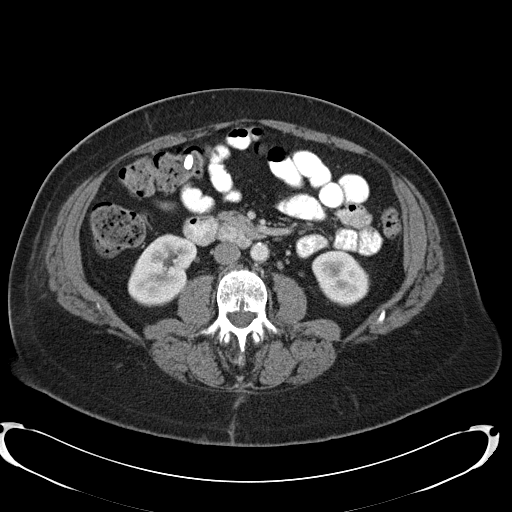
[im 58/123  soft-tissue]
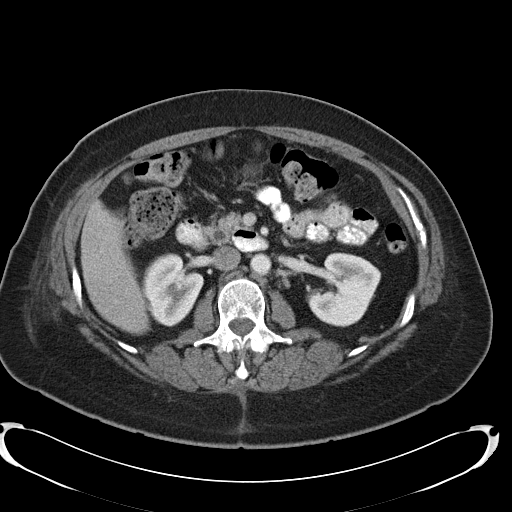
[im 65/123  soft-tissue]
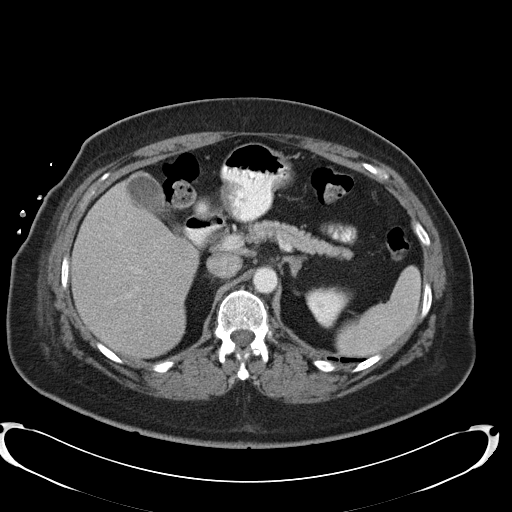
[im 79/123  soft-tissue]
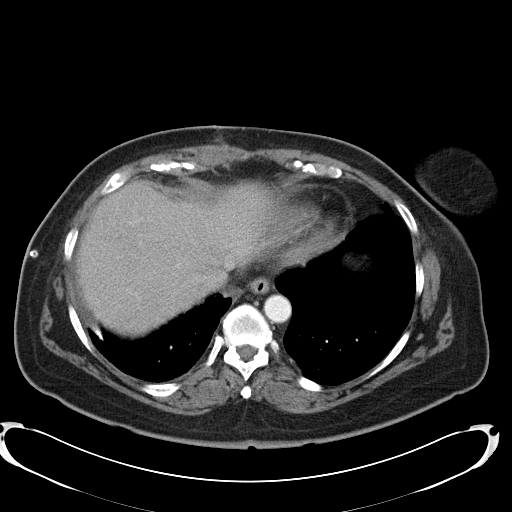
[im 87/123  soft-tissue]
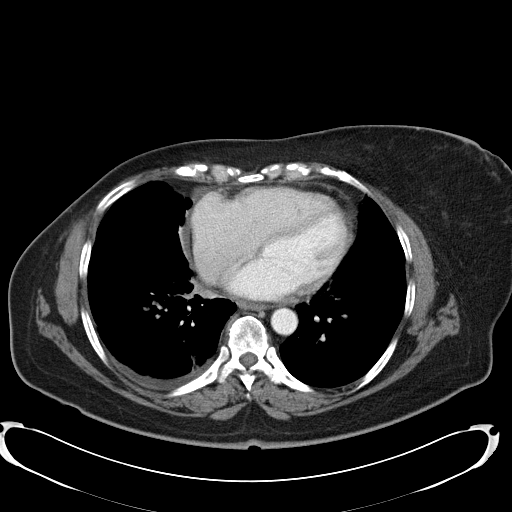
[im 87/123  bone]
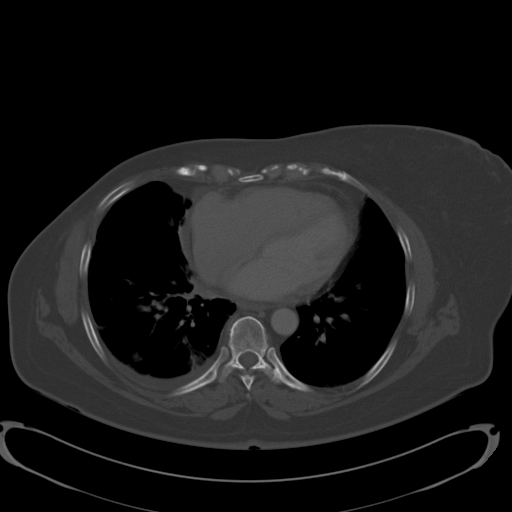
[im 94/123  soft-tissue]
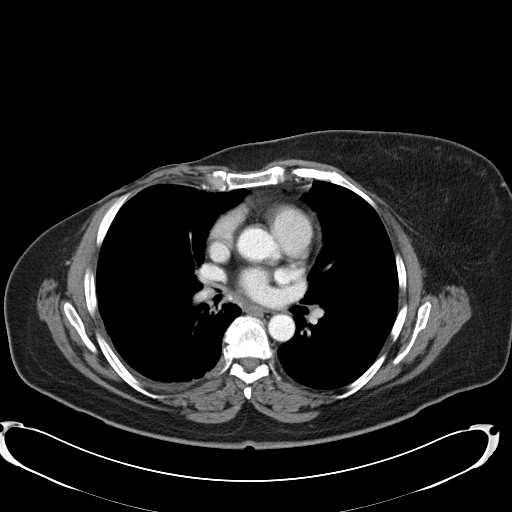
[im 108/123  soft-tissue]
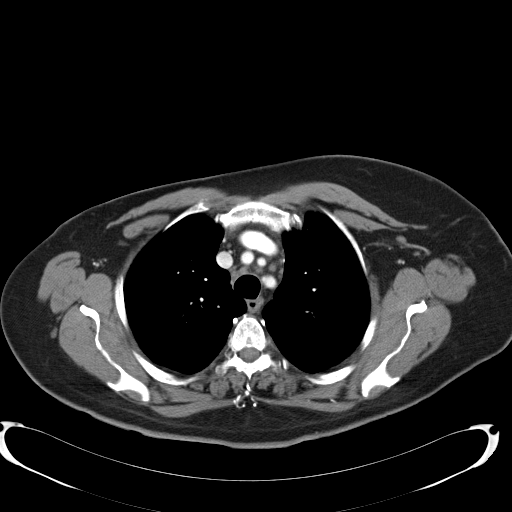
[im 115/123  soft-tissue]
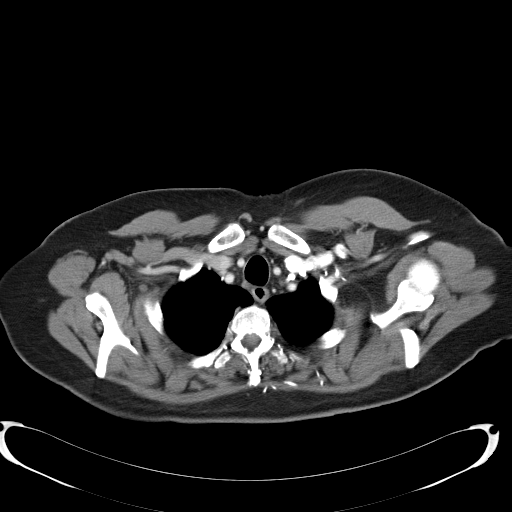

[Series 602: <mpr thick range> · coronal · 1.20mm/px · 3 of 110 slices shown]
[im 37/110  soft-tissue]
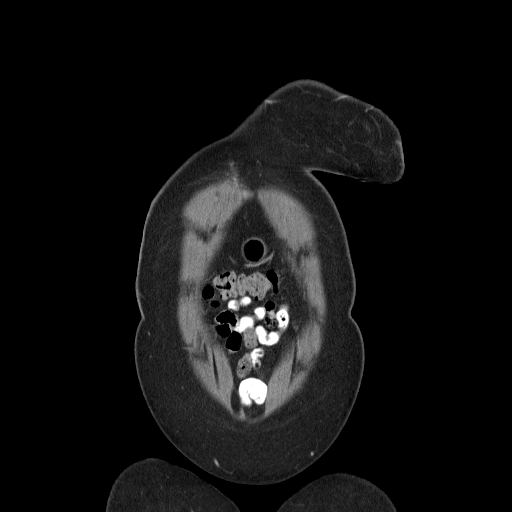
[im 49/110  soft-tissue]
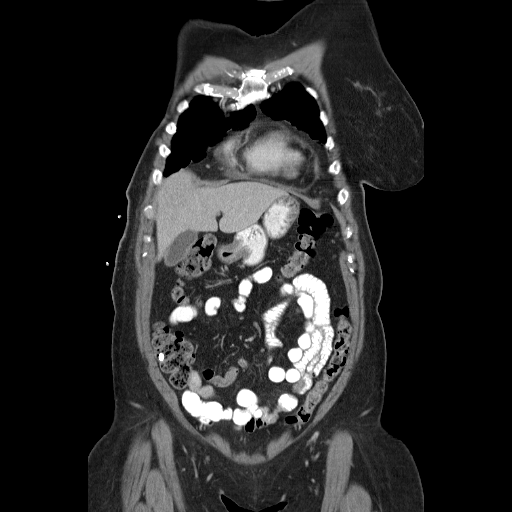
[im 61/110  soft-tissue]
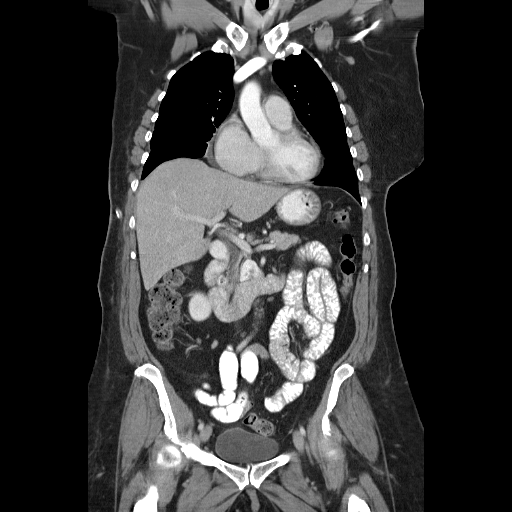

[15 of 46 positions shown; findings below may reference images not displayed]

Chest CT of 09/06/2011
and 04/16/2004.  Prior abdominal pelvic CT of 04/16/2004.

CT CHEST

RECIST 1.0 target lesions:
- 1.1 cm right lower lobe lung nodule, image 36.
- 1.2 cm subcarinal lymph node on image 29.
- 1.2 cm right hilar lymph node on image 27.

Non measurable lesions:  Small right pleural effusion.
FINDINGS: Lung windows demonstrate subtle mass effect upon the
bronchus intermedius and right lower lobe bronchus posteriorly,
including on images 26 and 28.  Concurrent soft tissue thickening
along the right lower lobe segmental bronchi, including on image
34.  This is similar to slightly improved since the prior exam.
Airway is narrowed but remains patent.

Re-expansion of the right lower lobe.
Index right lower lobe lung nodule measures 1.1 cm on image 36 of
series 5.  This is likely the same nodule measured 1.3 cm on the
prior exam (displaced by pericardial fluid on that exam).
Superior segment right lower lobe 6 mm nodule on image 28 is
unchanged. Clear left lung. Both nodules are new since 3551.

Soft tissue windows demonstrate right axillary nodal dissection and
mastectomy.  Stable small left axillary node.  No axillary or
subpectoral adenopathy.  Small left supraclavicular nodes.  Mild
cardiomegaly with resolved pericardial effusion.  Minimal
pericardial thickening remains.

The right-sided pleural effusion is markedly decreased, status post
pleurex catheter placement.  No well-defined pleural nodularity or
mass is seen.

Numerous small middle and anterior mediastinal lymph nodes are
identified.  None meets CT size criteria for pathologic
enlargement. These are enlarged from 3551.  Subcarinal nodal tissue
measures 1.2 cm, upper normal. New since 3551.  Calcification just
cephalad this subcarinal nodal tissue, suggesting old granulomatous
disease.

Right hilar node measures 1.2 cm, upper normal.  No left hilar
adenopathy.  No internal mammary adenopathy.
IMPRESSION: 1.  New right-sided lung nodules, consistent with metastatic
disease.
2.  Decreased right-sided pleural effusion, status post pleurex
catheter placement.
3.  Resolved pericardial effusion.
4.  New borderline enlarged mediastinal and right hilar lymph nodes
since 3551.  Cannot exclude nodal metastasis.
5.  Redemonstration of soft tissue thickening along the right
endobronchial tree.  Suspicious for infiltrative metastasis.
Recommend attention on follow-up.

CT ABDOMEN AND PELVIS
FINDINGS: RECIST 1.0 target lesions:  None within the abdomen or
pelvis.  An area of hyperenhancement within the liver is likely a
perfusion anomaly.

Wedge shaped area of hyperenhancement within the right lobe of the
liver on image 48, measuring 1.3 cm.  Favored to represent a
perfusion anomaly.  Focal steatosis adjacent the portal vein in the
posterior aspect of segment 4.

Normal spleen, stomach, pancreas, gallbladder, biliary tract,
adrenal glands, kidneys.  Small retroperitoneal nodes. No
retroperitoneal or retrocrural adenopathy.

Normal colon, appendix, and terminal ileum.  Normal small bowel
without abdominal ascites.

No evidence of omental or peritoneal disease.  No pelvic
adenopathy.    Normal urinary bladder and uterus.  No adnexal mass.
No significant free fluid.  Advanced left hip osteoarthritis. No
acute osseous abnormality.  Degenerative disc disease at L3-L4.
Vague.  left iliac sclerosis is similar to 3551 and benign.
IMPRESSION: 1.  Wedge-shaped area of hyperenhancement in the right lobe of the
liver, favored to represent a perfusion anomaly.
2.  Otherwise, no evidence of metastatic disease within the abdomen
or pelvis.
3.  Advanced left hip osteoarthritis.

## 2013-04-20 ENCOUNTER — Ambulatory Visit (HOSPITAL_COMMUNITY)
Admission: RE | Admit: 2013-04-20 | Discharge: 2013-04-20 | Disposition: A | Discharge status: Home / Self Care | Payer: Medicare Other | Source: Ambulatory Visit | Attending: Oncology | Admitting: Oncology

## 2013-04-20 ENCOUNTER — Encounter (HOSPITAL_COMMUNITY): Payer: Self-pay

## 2013-04-20 DIAGNOSIS — C50919 Malignant neoplasm of unspecified site of unspecified female breast: Secondary | ICD-10-CM | POA: Insufficient documentation

## 2013-04-20 DIAGNOSIS — C7951 Secondary malignant neoplasm of bone: Secondary | ICD-10-CM | POA: Insufficient documentation

## 2013-04-20 DIAGNOSIS — R911 Solitary pulmonary nodule: Secondary | ICD-10-CM | POA: Insufficient documentation

## 2013-04-20 DIAGNOSIS — Z79899 Other long term (current) drug therapy: Secondary | ICD-10-CM | POA: Insufficient documentation

## 2013-04-20 DIAGNOSIS — Z9010 Acquired absence of unspecified breast and nipple: Secondary | ICD-10-CM | POA: Insufficient documentation

## 2013-04-20 DIAGNOSIS — K5710 Diverticulosis of small intestine without perforation or abscess without bleeding: Secondary | ICD-10-CM | POA: Insufficient documentation

## 2013-04-20 DIAGNOSIS — M1610 Unilateral primary osteoarthritis, unspecified hip: Secondary | ICD-10-CM | POA: Insufficient documentation

## 2013-04-20 DIAGNOSIS — M169 Osteoarthritis of hip, unspecified: Secondary | ICD-10-CM | POA: Insufficient documentation

## 2013-04-20 DIAGNOSIS — K5730 Diverticulosis of large intestine without perforation or abscess without bleeding: Secondary | ICD-10-CM | POA: Insufficient documentation

## 2013-04-20 DIAGNOSIS — K7689 Other specified diseases of liver: Secondary | ICD-10-CM | POA: Insufficient documentation

## 2013-04-20 MED ORDER — IOHEXOL 300 MG/ML  SOLN
100.0000 mL | Freq: Once | INTRAMUSCULAR | Status: AC | PRN
  Administered 2013-04-20: 100 mL via INTRAVENOUS

## 2013-04-20 MED ORDER — IOHEXOL 300 MG/ML  SOLN
50.0000 mL | Freq: Once | INTRAMUSCULAR | Status: AC | PRN
  Administered 2013-04-20: 50 mL via ORAL

## 2013-04-22 ENCOUNTER — Telehealth: Payer: Self-pay | Admitting: Oncology

## 2013-04-22 ENCOUNTER — Encounter: Payer: Self-pay | Admitting: *Deleted

## 2013-04-22 ENCOUNTER — Inpatient Hospital Stay (HOSPITAL_COMMUNITY)
Admission: RE | Admit: 2013-04-22 | Discharge: 2013-04-22 | Disposition: A | Discharge status: Home / Self Care | Payer: Self-pay | Source: Ambulatory Visit | Attending: Oncology | Admitting: Oncology

## 2013-04-22 ENCOUNTER — Other Ambulatory Visit: Payer: Self-pay | Admitting: *Deleted

## 2013-04-22 ENCOUNTER — Ambulatory Visit (HOSPITAL_BASED_OUTPATIENT_CLINIC_OR_DEPARTMENT_OTHER): Payer: Self-pay | Admitting: Oncology

## 2013-04-22 ENCOUNTER — Other Ambulatory Visit: Payer: Self-pay | Admitting: Lab

## 2013-04-22 ENCOUNTER — Other Ambulatory Visit (HOSPITAL_BASED_OUTPATIENT_CLINIC_OR_DEPARTMENT_OTHER): Payer: Self-pay | Admitting: Lab

## 2013-04-22 VITALS — BP 124/83 | HR 91 | Temp 97.8°F | Resp 18 | Ht 65.0 in | Wt 183.0 lb

## 2013-04-22 DIAGNOSIS — C7951 Secondary malignant neoplasm of bone: Secondary | ICD-10-CM

## 2013-04-22 DIAGNOSIS — C7800 Secondary malignant neoplasm of unspecified lung: Secondary | ICD-10-CM

## 2013-04-22 DIAGNOSIS — C50919 Malignant neoplasm of unspecified site of unspecified female breast: Secondary | ICD-10-CM

## 2013-04-22 DIAGNOSIS — C50419 Malignant neoplasm of upper-outer quadrant of unspecified female breast: Secondary | ICD-10-CM

## 2013-04-22 DIAGNOSIS — J910 Malignant pleural effusion: Secondary | ICD-10-CM

## 2013-04-22 DIAGNOSIS — C50911 Malignant neoplasm of unspecified site of right female breast: Secondary | ICD-10-CM

## 2013-04-22 LAB — CBC WITH DIFFERENTIAL/PLATELET
Basophils Absolute: 0.1 10*3/uL (ref 0.0–0.1)
Eosinophils Absolute: 0.2 10*3/uL (ref 0.0–0.5)
HCT: 39.7 % (ref 34.8–46.6)
HGB: 13.3 g/dL (ref 11.6–15.9)
LYMPH%: 13.1 % — ABNORMAL LOW (ref 14.0–49.7)
MONO#: 0.9 10*3/uL (ref 0.1–0.9)
NEUT#: 5.4 10*3/uL (ref 1.5–6.5)
NEUT%: 71.3 % (ref 38.4–76.8)
Platelets: 324 10*3/uL (ref 145–400)
WBC: 7.6 10*3/uL (ref 3.9–10.3)

## 2013-04-22 LAB — PHOSPHORUS: Phosphorus: 3.6 mg/dL (ref 2.3–4.6)

## 2013-04-22 LAB — COMPREHENSIVE METABOLIC PANEL (CC13)
Alkaline Phosphatase: 186 U/L — ABNORMAL HIGH (ref 40–150)
BUN: 18 mg/dL (ref 7.0–26.0)
CO2: 23 mEq/L (ref 22–29)
Creatinine: 0.9 mg/dL (ref 0.6–1.1)
Glucose: 139 mg/dl (ref 70–140)
Total Bilirubin: 0.3 mg/dL (ref 0.20–1.20)
Total Protein: 8.1 g/dL (ref 6.4–8.3)

## 2013-04-22 LAB — BILIRUBIN, DIRECT: Bilirubin, Direct: 0.1 mg/dL (ref 0.0–0.3)

## 2013-04-22 LAB — LIPID PANEL
LDL Cholesterol: 137 mg/dL — ABNORMAL HIGH (ref 0–99)
Triglycerides: 296 mg/dL — ABNORMAL HIGH (ref <150)
VLDL: 59 mg/dL — ABNORMAL HIGH (ref 0–40)

## 2013-04-22 MED ORDER — INV-EVEROLIMUS (RAD0001) 5MG TABLET NOVARTIS CRAD001Y24135: 2.0000 | ORAL_TABLET | Freq: Every day | ORAL | Status: DC

## 2013-04-22 NOTE — Progress Notes (Signed)
04/22/13 at 9:48am - Bolero 4 cycle 21, day 1 study notes- The pt was into the cancer center this am for her cycle 21 assessments.  The pt confirmed that she was fasting upon arrival to the cancer center for her am labs.  She states that she has not had anything to eat or drink since last pm.  The pt returned her cycle 20 study drug everolimus box.  The pt confirmed that she took her study drug from 03/25/13 through 04/21/13 ( 28 days) along with her letrozole daily.  The pt's study drug box revealed that she took 56 pills and there were 14 unopened blister packs of everolimus.  The pt had a 100% compliance which was confirmed also by our pharmacy department.  The pt was seen and examined today by Dr. Welton Flakes.  Dr. Welton Flakes reviewed her CT scans from 04/20/13, and she states the pt has no evidence of progression. Dr. Welton Flakes also reviewed the pt's labs and states that the pt's lab abnormals are not clinically significant.  The pt's concomitant medications were reviewed with the pt and the pt denies any new medications and any changes to her current medications.  The pt states she is still experiencing the following adverse events:  Bilateral knee pain, moderate fatigue, stable bilateral cataracts, intermittent nausea and diarrhea, arthritis in hands, memory impairment, and hot flashes.  The pt recently went home to visit her mother and sister in [place].  She drove there and back home.  ECOG =1.  The pt was given her cycle 21 everolimus drug box.  The pt reports she has enough letrozole at home. Dr. Welton Flakes advised the pt to remain on her current treatment with no modifications.  The pt is aware of her cycle 22 appointments.

## 2013-04-22 NOTE — Progress Notes (Signed)
OFFICE PROGRESS NOTE  CC  Barbara Espy, MD [address] [place] [address]  DIAGNOSIS: 62 year old female with metastatic breast cancer with bone metastasis and lung metastasis.  PRIOR THERAPY:  #1 patient originally presented when she was 62 years old with early stage breast cancer in 2005. At that time she underwent a lumpectomy with axillary lymph node dissection. She was followed by Dr. Pierce Crane and Theron Arista young. Her last visit to Dr. Renelda Loma office was in 2006. Because of difficulties with insurance she had not been receiving mammograms.  #2 in 2012 patient began having shortness of breath on exertion. She was seen by pulmonary service and a CT scan showed some minor mediastinal adenopathy as well as borderline lower lobe nodule. PET scan was not performed. October 2012 patient began to experience more shortness of breath. Initially she was told that she had pneumonia. However subsequently she developed lower extremity edema. CT of the chest was obtained that showed a large pleural effusion in the right side as well as pericardial effusion. She was seen by cardiothoracic surgery placed in 09/11/2011. The cytology revealed that patient indeed have recurrent breast cancer that was hormone receptor positive HER-2/neu was not obtained as it was insufficient tissue. She subsequently had a CT scan performed that showed 2 nodules in the lung suspicious for metastatic disease as well as left lower lobe collapse. She had a biopsy of the lungs performed on 09/11/2011 the tumor was estrogen receptor +91% progesterone receptor +100% proliferation marker Ki-67 35%.  #3 patient was seen by Dr. Pierce Crane and he recommended that she be enrolled on the BOLERO-4 clinical trial utilizing letrozole 2.5 mg per day and affinitor10 mg per day. She initiated this on 10/10/2011.she has had a significant response to this lower extremity edema has reduced significantly in size she is no longer short  of breath.  CURRENT THERAPY:BOLERO -4 with everolimus 10 mg and letrozole 2.5 mg daily. Patient will begin cycle 21 day 1 today. ( 28 day cycles)  INTERVAL HISTORY: QUIN MCPHERSON 62 y.o. female returns for followup visit prior to cycle 21  Her main complaints include fatigue, and the arthritis in her fingers.  The hot flashes, forgetfulness, nausea, diarrhea, and cataracts are stable.  Her headaches have resolved.  She is taking the Everolimus and Letrozole daily and doing well. Patient is complaining of swelling in the left ankle and hands this may be due to the letrozole. However especially in the lower extremity I would like to do a Doppler ultrasound I will order this. Otherwise a 10 point ROS is neg.   MEDICAL HISTORY: Past Medical History  Diagnosis Date  . Asthma   . Shortness of breath   . Headache(784.0)     OTC Prilosec  . Arthritis     left hip  . Contact dermatitis     from a bandaid that had Latex   . breast ca 2005    breast/chemo R mastectomy  . Metastasis to lung dx'd 08/2011    ALLERGIES:  is allergic to aspirin; latex; and nsaids.  MEDICATIONS:  Current Outpatient Prescriptions  Medication Sig Dispense Refill  . cholecalciferol (VITAMIN D) 1000 UNITS tablet Take 2,000 Units by mouth daily.       Marland Kitchen gemfibrozil (LOPID) 600 MG tablet Take 1 tablet (600 mg total) by mouth 2 (two) times daily before a meal.  60 tablet  1  . ibuprofen (ADVIL,MOTRIN) 200 MG tablet Take 400 mg by mouth every 6 (six)  hours as needed. For pain      . Investigational everolimus (RAD001) 5 MG tablet Nov[employer]is [id number] Take 2 tablets by mouth daily. Take with a glass of water.  70 tablet  0  . Investigational everolimus (RAD001) 5 MG tablet Novartis [id number] Take 2 tablets by mouth daily. Take with a glass of water.  70 tablet  0  . letrozole (FEMARA) 2.5 MG tablet Take 1 tablet (2.5 mg total) by mouth daily.  90 tablet  6  . loperamide (IMODIUM) 2 MG capsule Take 2 mg by mouth 4  (four) times daily as needed for diarrhea or loose stools.      . metoprolol tartrate (LOPRESSOR) 25 MG tablet Take 0.5 tablets (12.5 mg total) by mouth daily.  30 tablet  4  . prochlorperazine (COMPAZINE) 10 MG tablet Take 10 mg by mouth every 6 (six) hours as needed. For nausea       No current facility-administered medications for this visit.    SURGICAL HISTORY:  Past Surgical History  Procedure Laterality Date  . Spine surgery  1996  . Breast surgery  2005    right  . Video bronchoscopy  09/11/2011    Procedure: VIDEO BRONCHOSCOPY;  Surgeon: Alleen Borne, MD;  Location: [facility] OR;  Service: Thoracic;  Laterality: N/A;  . Chest tube insertion  09/11/2011    Procedure: INSERTION PLEURAL DRAINAGE CATHETER;  Surgeon: Alleen Borne, MD;  Location: [facility] OR;  Service: Thoracic;  Laterality: Right;  . Pericardial window  09/11/2011    Procedure: PERICARDIAL WINDOW;  Surgeon: Alleen Borne, MD;  Location: [facility] OR;  Service: Thoracic;  Laterality: N/A;  . Removal of pleural drainage catheter  12/19/2011    Procedure: REMOVAL OF PLEURAL DRAINAGE CATHETER;  Surgeon: Alleen Borne, MD;  Location: [facility] OR;  Service: Thoracic;  Laterality: Right;  TO BE DONE IN MINOR ROOM, SHORT STAY    REVIEW OF SYSTEMS:  General: fatigue (+), night sweats (-), fever (-), pain (-) Lymph: palpable nodes (-) HEENT: vision changes (-), mucositis (-), gum bleeding (-), epistaxis (-) Cardiovascular: chest pain (-), palpitations (-) Pulmonary: shortness of breath (-), dyspnea on exertion (-), cough (-), hemoptysis (-) GI:  Early satiety (-), melena (-), dysphagia (-), nausea/vomiting (-), diarrhea (-) GU: dysuria (-), hematuria (-), incontinence (-) Musculoskeletal: joint swelling (-), joint pain (+), back pain (-) Neuro: weakness (-), numbness (-), headache (-), confusion (-) Skin: Rash (-), lesions (-), dryness (-) Psych: depression (-), suicidal/homicidal ideation (-), feeling of hopelessness (-)   PHYSICAL  EXAMINATION: Blood pressure 124/83, pulse 91, temperature 97.8 F (36.6 C), temperature source Oral, resp. rate 18, height 5\' 5"  (1.651 m), weight 183 lb (83.008 kg). Body mass index is 30.45 kg/(m^2). General: Patient is a well appearing female in no acute distress HEENT: PERRLA, sclerae anicteric no conjunctival pallor, MMM Neck: supple, no palpable adenopathy Lungs: clear to auscultation bilaterally, no wheezes, rhonchi, or rales Cardiovascular: regular rate rhythm, S1, S2, no murmurs, rubs or gallops Abdomen: Soft, non-tender, non-distended, normoactive bowel sounds, no HSM Extremities: warm and well perfused, no clubbing, cyanosis, or edema Skin: No rashes or lesions Neuro: Non-focal ECOG PERFORMANCE STATUS: 1 - Symptomatic but completely ambulatory    LABORATORY DATA: Lab Results  Component Value Date   WBC 7.6 04/22/2013   HGB 13.3 04/22/2013   HCT 39.7 04/22/2013   MCV 78.1* 04/22/2013   PLT 324 04/22/2013      Chemistry      Component Value Date/Time  NA 141 04/22/2013 0817   NA 136 07/16/2012 0910   K 4.4 04/22/2013 0817   K 4.0 07/16/2012 0910   CL 109* 12/31/2012 0940   CL 103 07/16/2012 0910   CO2 23 04/22/2013 0817   CO2 21 07/16/2012 0910   BUN 18.0 04/22/2013 0817   BUN 16 07/16/2012 0910   CREATININE 0.9 04/22/2013 0817   CREATININE 0.75 07/16/2012 0910      Component Value Date/Time   CALCIUM 10.6* 04/22/2013 0817   CALCIUM 10.1 07/16/2012 0910   ALKPHOS 186* 04/22/2013 0817   ALKPHOS 142* 07/16/2012 0910   AST 25 04/22/2013 0817   AST 30 07/16/2012 0910   ALT 22 04/22/2013 0817   ALT 33 07/16/2012 0910   BILITOT 0.30 04/22/2013 0817   BILITOT 0.2* 07/16/2012 0910       RADIOGRAPHIC STUDIES: CT CHEST, ABDOMEN AND PELVIS WITH CONTRAST  Technique: Contiguous axial images of the chest abdomen and pelvis  were obtained after IV contrast administration.  Contrast: 100 ml Omnipaque-300  Comparison: 11/03/2012  CT CHEST  RECIST protocol 1.0  Target Lesions:   1.Right lower lobe lung "nodule" 5 mm on image 28/series 5 versus 6  mm on the prior.  2. Subcarinal lymph node 9 mm short axis on image 28/series 2  versus 8 mm on the prior.  3. Right hilar node 9 mm on image 26/series 2 versus similar on  the prior exam (when remeasured).  Non Target lesions:  1. Findings suspicious for osseous metastasis. Similar.  Findings: Lung windows demonstrate mild scarring at the right lung  base. Clear left lung.  Soft tissue windows demonstrate no supraclavicular adenopathy.  Right mastectomy and axillary nodal dissection. No axillary  adenopathy. Tortuous descending thoracic aorta. Heart size upper  normal, without pericardial effusion. Trace right-sided pleural  thickening is unchanged. No central pulmonary embolism, on this non-  dedicated study. No mediastinal or hilar adenopathy. No internal  mammary adenopathy.  A sclerotic lesion involving the left posterior 11th rib is similar  on image 45/series 2. This is absent on 08/09/2010.  IMPRESSION:  1. Similar eleventh posterior left rib sclerosis, suspicious for  osseous metastasis.  2. No evidence of extra osseous metastasis within the chest.  CT ABDOMEN AND PELVIS  Findings: Moderate to marked hepatic steatosis. Similar vague  hyperenhancement the right lobe liver on image 52/series 2, likely  perfusion anomaly. There is sparing of steatosis adjacent the  gallbladder. Normal spleen, stomach. Duodenal diverticulum  involves the transverse segment. Normal pancreas, gallbladder,  biliary tract, right gland, kidneys. Minimal left adrenal  nodularity is unchanged. No retroperitoneal or retrocrural  adenopathy.  Normal colon, appendix, and terminal ileum. Normal small bowel  without abdominal ascites.  No evidence of omental or peritoneal disease.  No pelvic adenopathy. Normal urinary bladder and uterus. No  adnexal mass. No significant free fluid.  Advanced left hip osteoarthritis. Vague sclerosis  involving the  left iliac crest is unchanged. A well-defined sclerotic lesion in  the right iliac crest is similar. Subtle sclerosis in the anterior  left sacrum on image 94 is similar.  IMPRESSION:  1. Similar subtle osseous sclerosis, for which metastatic disease  cannot be excluded.  2. No extra osseous metastasis within the abdomen/pelvis.  3. Moderate to marked hepatic steatosis.      ASSESSMENT: 62 year old female with  #1 original diagnosis of breast cancer in 2005. She was treated with a lumpectomy followed by radiation. She apparently did go on antiestrogen therapy. Thereafter she  was lost to follow up due to lack of finances and loss of insurance.    2 patient was seen by Dr. Donnie Coffin in March 2013 with metastatic recurrent breast cancer that was ER positive PR positive with lung metastasis malignant pleural effusion and malignant pericardial effusion.she underwent pericardial window as well as Pleurx catheter placement.  #3 she was then begun on BOLERO 4 clinical trial consisting of everolimus 10 mg and letrozole 2.5 mg given on a daily basis on a 28 day cycle. So far she has completed 18 cycles. She is here for cycle number 19.   #4patient had CT scans performed on 12/29/2012.the scan does not show any progressive disease and clinically patient seems to be doing quite nicely.   #5 CT scan scan on 02/23/2013 reveal stable disease.    PLAN:   #1 patient will proceed with cycle 21 of her chemotherapy consisting of everolimus and letrozole.  #2 patient will return in 4 weeks' time for followup and she will have scans.  All questions were answered. The patient knows to call the clinic with any problems, questions or concerns. We can certainly see the patient much sooner if necessary.  I spent 25 minutes counseling the patient face to face. The total time spent in the appointment was 25 minutes.  Drue Second, MD Medical/Oncology [facility] [phone]  (beeper) [phone] (Office)

## 2013-05-05 IMAGING — CR DG CHEST 2V
2 series · 2 of 2 positions shown · non-contrast
Comparison: Chest x-ray 09/30/2011.

CLINICAL DATA: Follow up pleural effusion.

CHEST - 2 VIEW

[w chest pa]
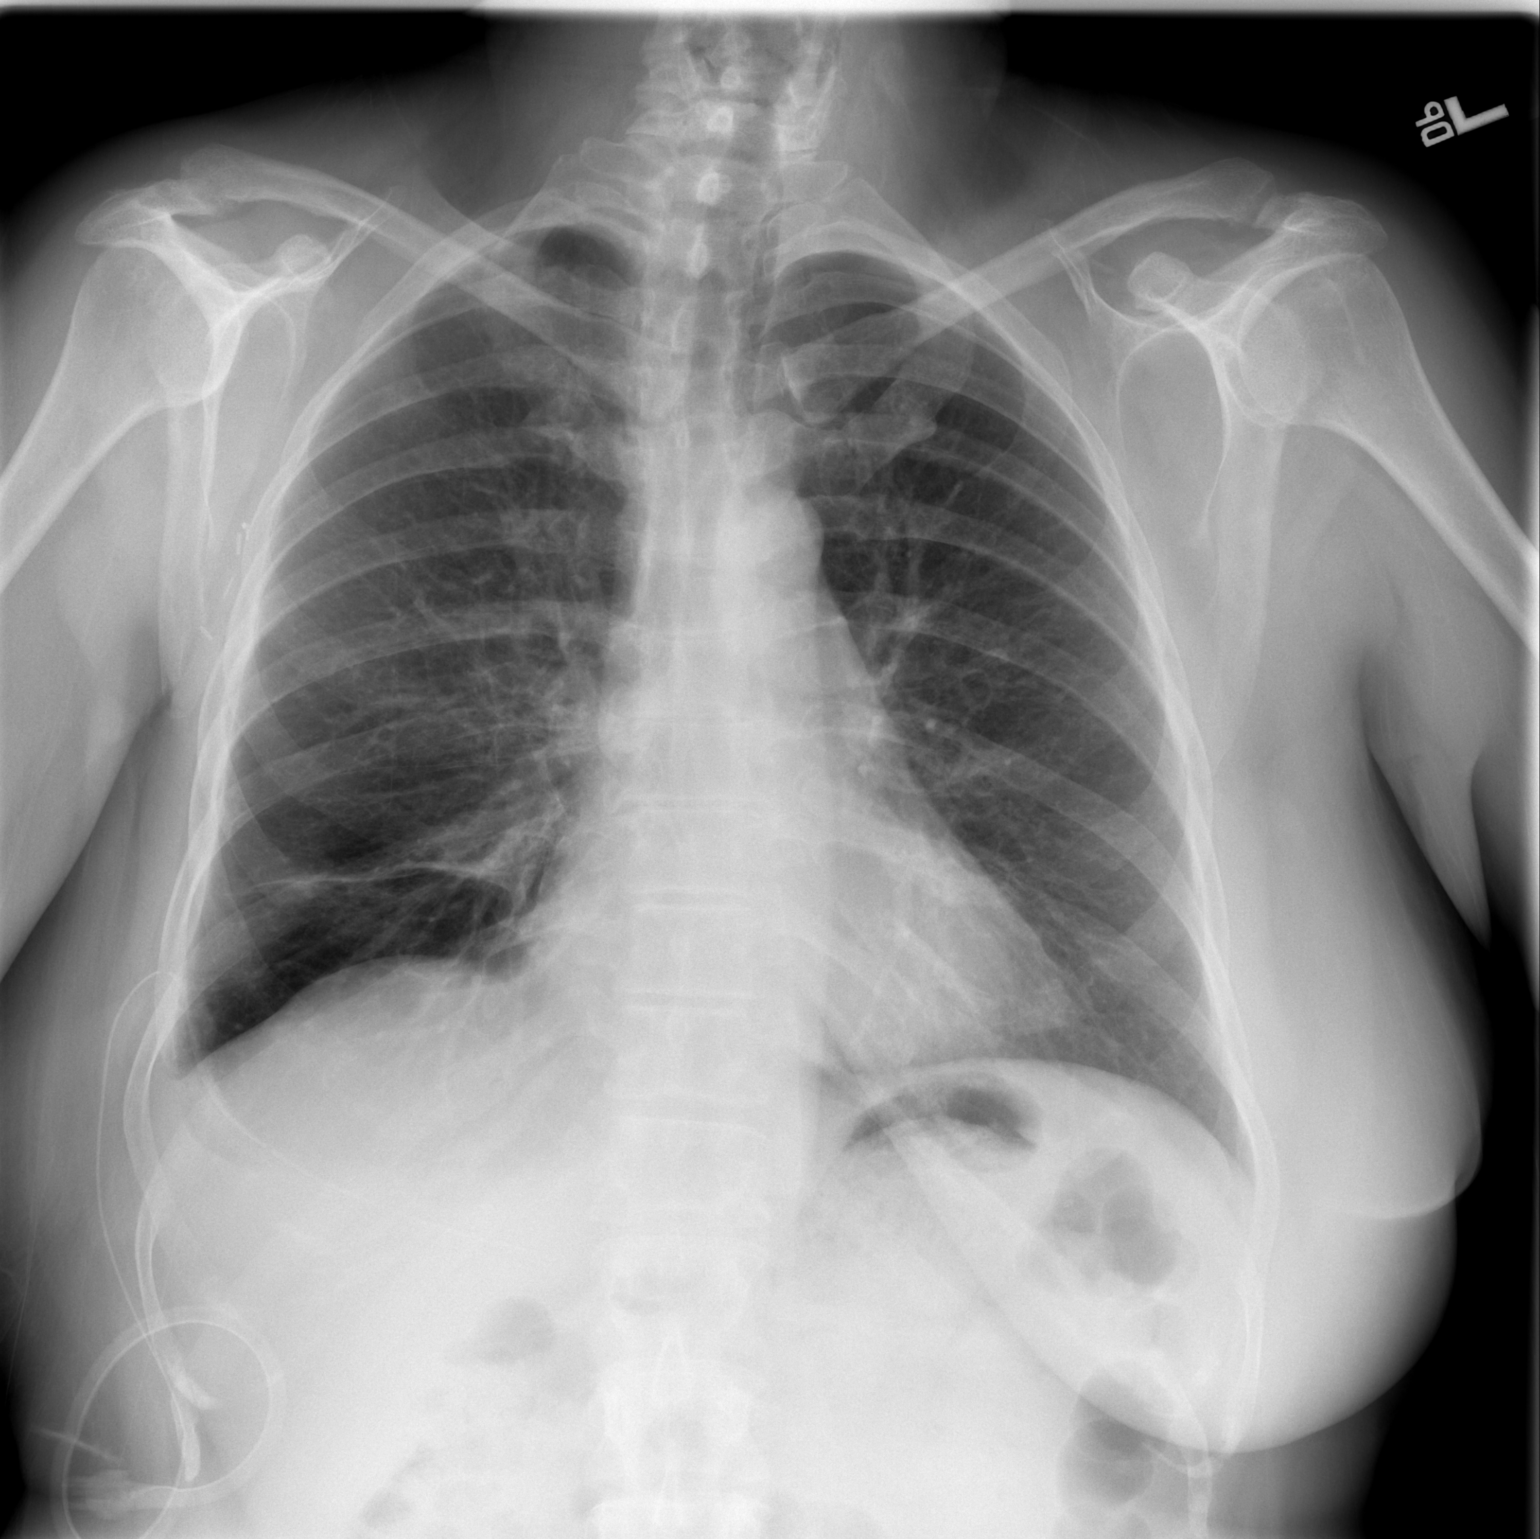

[w chest lat]
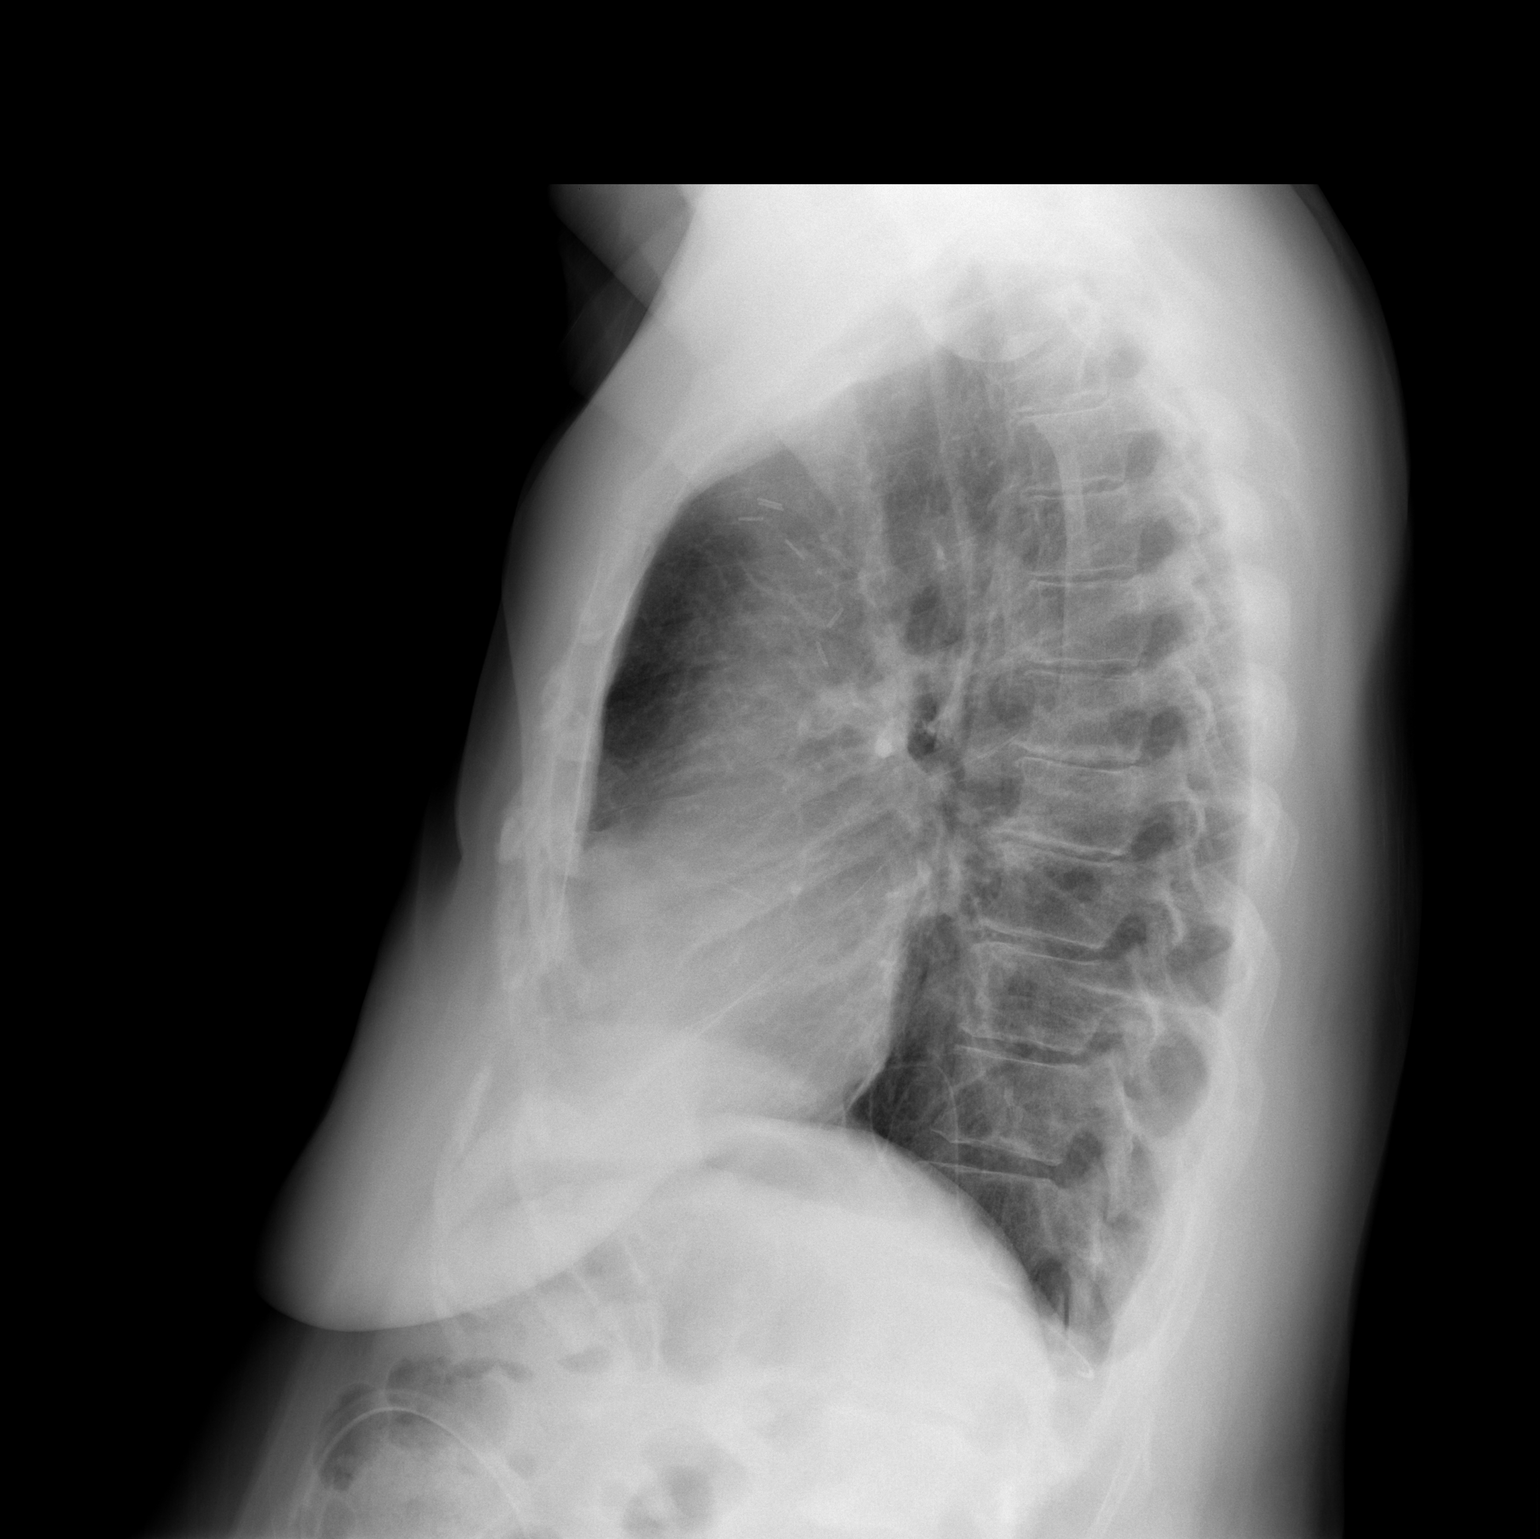

[2 of 2 positions shown; findings below may reference images not displayed]

FINDINGS: The Pleurx drainage catheter is in stable position.  No
definite residual pleural effusion.  Minimal basilar atelectasis or
scarring.  Stable surgical changes from right mastectomy.  The left
lung is clear.
IMPRESSION: Stable position of the Pleurx drainage catheter without residual
pleural effusion.

## 2013-05-19 ENCOUNTER — Other Ambulatory Visit: Payer: Self-pay | Admitting: *Deleted

## 2013-05-19 DIAGNOSIS — C50919 Malignant neoplasm of unspecified site of unspecified female breast: Secondary | ICD-10-CM

## 2013-05-20 ENCOUNTER — Telehealth: Payer: Self-pay | Admitting: Oncology

## 2013-05-20 ENCOUNTER — Ambulatory Visit (HOSPITAL_COMMUNITY)
Admission: RE | Admit: 2013-05-20 | Discharge: 2013-05-20 | Disposition: A | Discharge status: Home / Self Care | Payer: Medicare Other | Source: Ambulatory Visit | Attending: Oncology | Admitting: Oncology

## 2013-05-20 ENCOUNTER — Other Ambulatory Visit (HOSPITAL_BASED_OUTPATIENT_CLINIC_OR_DEPARTMENT_OTHER): Payer: Self-pay | Admitting: Lab

## 2013-05-20 ENCOUNTER — Encounter: Payer: Self-pay | Admitting: Oncology

## 2013-05-20 ENCOUNTER — Ambulatory Visit (HOSPITAL_BASED_OUTPATIENT_CLINIC_OR_DEPARTMENT_OTHER): Payer: Self-pay | Admitting: Oncology

## 2013-05-20 ENCOUNTER — Encounter: Payer: Self-pay | Admitting: *Deleted

## 2013-05-20 VITALS — BP 119/82 | HR 103 | Temp 98.0°F | Resp 18 | Ht 65.0 in | Wt 184.0 lb

## 2013-05-20 DIAGNOSIS — C50919 Malignant neoplasm of unspecified site of unspecified female breast: Secondary | ICD-10-CM

## 2013-05-20 DIAGNOSIS — C7800 Secondary malignant neoplasm of unspecified lung: Secondary | ICD-10-CM

## 2013-05-20 DIAGNOSIS — R062 Wheezing: Secondary | ICD-10-CM | POA: Insufficient documentation

## 2013-05-20 DIAGNOSIS — C7951 Secondary malignant neoplasm of bone: Secondary | ICD-10-CM

## 2013-05-20 DIAGNOSIS — J189 Pneumonia, unspecified organism: Secondary | ICD-10-CM

## 2013-05-20 DIAGNOSIS — C50419 Malignant neoplasm of upper-outer quadrant of unspecified female breast: Secondary | ICD-10-CM

## 2013-05-20 DIAGNOSIS — I313 Pericardial effusion (noninflammatory): Secondary | ICD-10-CM

## 2013-05-20 DIAGNOSIS — R05 Cough: Secondary | ICD-10-CM | POA: Insufficient documentation

## 2013-05-20 DIAGNOSIS — J90 Pleural effusion, not elsewhere classified: Secondary | ICD-10-CM

## 2013-05-20 DIAGNOSIS — J910 Malignant pleural effusion: Secondary | ICD-10-CM

## 2013-05-20 DIAGNOSIS — R0602 Shortness of breath: Secondary | ICD-10-CM

## 2013-05-20 DIAGNOSIS — R059 Cough, unspecified: Secondary | ICD-10-CM | POA: Insufficient documentation

## 2013-05-20 LAB — CBC WITH DIFFERENTIAL/PLATELET
BASO%: 0.8 % (ref 0.0–2.0)
EOS%: 1.8 % (ref 0.0–7.0)
HCT: 38.9 % (ref 34.8–46.6)
MCH: 26.2 pg (ref 25.1–34.0)
MCHC: 34.2 g/dL (ref 31.5–36.0)
MCV: 76.6 fL — ABNORMAL LOW (ref 79.5–101.0)
MONO%: 10 % (ref 0.0–14.0)
NEUT#: 7.3 10*3/uL — ABNORMAL HIGH (ref 1.5–6.5)
RBC: 5.08 10*6/uL (ref 3.70–5.45)
RDW: 14.3 % (ref 11.2–14.5)
lymph#: 1.1 10*3/uL (ref 0.9–3.3)

## 2013-05-20 LAB — COMPREHENSIVE METABOLIC PANEL (CC13)
ALT: 23 U/L (ref 0–55)
AST: 26 U/L (ref 5–34)
Albumin: 3.3 g/dL — ABNORMAL LOW (ref 3.5–5.0)
Alkaline Phosphatase: 171 U/L — ABNORMAL HIGH (ref 40–150)
BUN: 12 mg/dL (ref 7.0–26.0)
Calcium: 10.3 mg/dL (ref 8.4–10.4)
Chloride: 109 mEq/L (ref 98–109)
Potassium: 4.1 mEq/L (ref 3.5–5.1)
Sodium: 142 mEq/L (ref 136–145)
Total Bilirubin: 0.25 mg/dL (ref 0.20–1.20)
Total Protein: 8.1 g/dL (ref 6.4–8.3)

## 2013-05-20 LAB — LIPID PANEL
Cholesterol: 241 mg/dL — ABNORMAL HIGH (ref 0–200)
HDL: 49 mg/dL (ref >39)
LDL Cholesterol: 121 mg/dL — ABNORMAL HIGH (ref 0–99)
Total CHOL/HDL Ratio: 4.9 Ratio
VLDL: 71 mg/dL — ABNORMAL HIGH (ref 0–40)

## 2013-05-20 LAB — PHOSPHORUS: Phosphorus: 3 mg/dL (ref 2.3–4.6)

## 2013-05-20 LAB — CK: Total CK: 70 U/L (ref 7–177)

## 2013-05-20 LAB — URIC ACID (CC13): Uric Acid, Serum: 3.9 mg/dl (ref 2.6–7.4)

## 2013-05-20 MED ORDER — INV-EVEROLIMUS (RAD0001) 5MG TABLET NOVARTIS CRAD001Y24135: 2.0000 | ORAL_TABLET | Freq: Every day | ORAL | Status: DC

## 2013-05-20 MED ORDER — FLUTICASONE PROPIONATE HFA 110 MCG/ACT IN AERO: 1.0000 | INHALATION_SPRAY | Freq: Two times a day (BID) | RESPIRATORY_TRACT | Status: DC

## 2013-05-20 MED ORDER — AZITHROMYCIN 250 MG PO TABS: ORAL_TABLET | ORAL | Status: DC

## 2013-05-20 NOTE — Progress Notes (Signed)
OFFICE PROGRESS NOTE  CC  Barbara Espy, MD [address] [place] [address]  DIAGNOSIS: 62 year old female with metastatic breast cancer with bone metastasis and lung metastasis.  PRIOR THERAPY:  #1 patient originally presented when she was 62 years old with early stage breast cancer in 2005. At that time she underwent a lumpectomy with axillary lymph node dissection. She was followed by Dr. Pierce Crane and Theron Arista young. Her last visit to Dr. Renelda Loma office was in 2006. Because of difficulties with insurance she had not been receiving mammograms.  #2 in 2012 patient began having shortness of breath on exertion. She was seen by pulmonary service and a CT scan showed some minor mediastinal adenopathy as well as borderline lower lobe nodule. PET scan was not performed. October 2012 patient began to experience more shortness of breath. Initially she was told that she had pneumonia. However subsequently she developed lower extremity edema. CT of the chest was obtained that showed a large pleural effusion in the right side as well as pericardial effusion. She was seen by cardiothoracic surgery placed in 09/11/2011. The cytology revealed that patient indeed have recurrent breast cancer that was hormone receptor positive HER-2/neu was not obtained as it was insufficient tissue. She subsequently had a CT scan performed that showed 2 nodules in the lung suspicious for metastatic disease as well as left lower lobe collapse. She had a biopsy of the lungs performed on 09/11/2011 the tumor was estrogen receptor +91% progesterone receptor +100% proliferation marker Ki-67 35%.  #3 patient was seen by Dr. Pierce Crane and he recommended that she be enrolled on the BOLERO-4 clinical trial utilizing letrozole 2.5 mg per day and affinitor10 mg per day. She initiated this on 10/10/2011.she has had a significant response to this lower extremity edema has reduced significantly in size she is no  longer short of breath.  CURRENT THERAPY:BOLERO -4 with everolimus 10 mg and letrozole 2.5 mg daily. Patient will begin cycle 22 day 1 today. ( 28 day cycles)  INTERVAL HISTORY: MIANNA Parks 62 y.o. female returns for followup visit prior to cycle 22  Her main complaints include fatigue, and the arthritis in her fingers.  The hot flashes, forgetfulness, nausea, diarrhea, and cataracts are stable.  Her headaches have resolved.  She is taking the Everolimus and Letrozole daily and doing well. She has had upper respiratory tract infection initially starting as a head cold with sinus congestion now she states that it has settled i nto her lungs. On exam she does have wheezing. She is coughing yellow sputum. She has currently no fevers or chills. She did experience them earlier in the week. Remainder of the 10 point review of systems is negative. MEDICAL HISTORY: Past Medical History  Diagnosis Date  . Asthma   . Shortness of breath   . Headache(784.0)     OTC Prilosec  . Arthritis     left hip  . Contact dermatitis     from a bandaid that had Latex   . breast ca 2005    breast/chemo R mastectomy  . Metastasis to lung dx'd 08/2011    ALLERGIES:  is allergic to aspirin; latex; and nsaids.  MEDICATIONS:  Current Outpatient Prescriptions  Medication Sig Dispense Refill  . cholecalciferol (VITAMIN D) 1000 UNITS tablet Take 2,000 Units by mouth daily.       Marland Kitchen gemfibrozil (LOPID) 600 MG tablet Take 1 tablet (600 mg total) by mouth 2 (two) times daily before a meal.  60 tablet  1  . ibuprofen (ADVIL,MOTRIN) 200 MG tablet Take 400 mg by mouth every 6 (six) hours as needed. For pain      . Investigational everolimus (RAD001) 5 MG tablet Novartis [id number] Take 2 tablets by mouth daily. Take with a glass of water.  70 tablet  0  . letrozole (FEMARA) 2.5 MG tablet Take 1 tablet (2.5 mg total) by mouth daily.  90 tablet  6  . loperamide (IMODIUM) 2 MG capsule Take 2 mg by mouth 4 (four) times  daily as needed for diarrhea or loose stools.      . metoprolol tartrate (LOPRESSOR) 25 MG tablet Take 0.5 tablets (12.5 mg total) by mouth daily.  30 tablet  4  . prochlorperazine (COMPAZINE) 10 MG tablet Take 10 mg by mouth every 6 (six) hours as needed. For nausea       No current facility-administered medications for this visit.    SURGICAL HISTORY:  Past Surgical History  Procedure Laterality Date  . Spine surgery  1996  . Breast surgery  2005    right  . Video bronchoscopy  09/11/2011    Procedure: VIDEO BRONCHOSCOPY;  Surgeon: Alleen Borne, MD;  Location: [facility] OR;  Service: Thoracic;  Laterality: N/A;  . Chest tube insertion  09/11/2011    Procedure: INSERTION PLEURAL DRAINAGE CATHETER;  Surgeon: Alleen Borne, MD;  Location: [facility] OR;  Service: Thoracic;  Laterality: Right;  . Pericardial window  09/11/2011    Procedure: PERICARDIAL WINDOW;  Surgeon: Alleen Borne, MD;  Location: [facility] OR;  Service: Thoracic;  Laterality: N/A;  . Removal of pleural drainage catheter  12/19/2011    Procedure: REMOVAL OF PLEURAL DRAINAGE CATHETER;  Surgeon: Alleen Borne, MD;  Location: [facility] OR;  Service: Thoracic;  Laterality: Right;  TO BE DONE IN MINOR ROOM, SHORT STAY    REVIEW OF SYSTEMS:  General: fatigue (+), night sweats (-), fever (-), pain (-) Lymph: palpable nodes (-) HEENT: vision changes (-), mucositis (-), gum bleeding (-), epistaxis (-) Cardiovascular: chest pain (-), palpitations (-) Pulmonary: shortness of breath (-), dyspnea on exertion (-), cough (-), hemoptysis (-) GI:  Early satiety (-), melena (-), dysphagia (-), nausea/vomiting (-), diarrhea (-) GU: dysuria (-), hematuria (-), incontinence (-) Musculoskeletal: joint swelling (-), joint pain (+), back pain (-) Neuro: weakness (-), numbness (-), headache (-), confusion (-) Skin: Rash (-), lesions (-), dryness (-) Psych: depression (-), suicidal/homicidal ideation (-), feeling of hopelessness (-)   PHYSICAL EXAMINATION: Blood  pressure 119/82, pulse 103, temperature 98 F (36.7 C), temperature source Oral, resp. rate 18, height 5\' 5"  (1.651 m), weight 184 lb (83.462 kg), SpO2 96.00%. Body mass index is 30.62 kg/(m^2). General: Patient is a well appearing female in no acute distress HEENT: PERRLA, sclerae anicteric no conjunctival pallor, MMM Neck: supple, no palpable adenopathy Lungs: Bilateral wheezes no rales or rhonchi Cardiovascular: regular rate rhythm, S1, S2, no murmurs, rubs or gallops Abdomen: Soft, non-tender, non-distended, normoactive bowel sounds, no HSM Extremities: warm and well perfused, no clubbing, cyanosis, or edema Skin: No rashes or lesions Neuro: Non-focal ECOG PERFORMANCE STATUS: 1 - Symptomatic but completely ambulatory    LABORATORY DATA: Lab Results  Component Value Date   WBC 9.7 05/20/2013   HGB 13.3 05/20/2013   HCT 38.9 05/20/2013   MCV 76.6* 05/20/2013   PLT 361 05/20/2013      Chemistry      Component Value Date/Time   NA 142 05/20/2013 1041   NA  136 07/16/2012 0910   K 4.1 05/20/2013 1041   K 4.0 07/16/2012 0910   CL 109* 12/31/2012 0940   CL 103 07/16/2012 0910   CO2 23 05/20/2013 1041   CO2 21 07/16/2012 0910   BUN 12.0 05/20/2013 1041   BUN 16 07/16/2012 0910   CREATININE 0.9 05/20/2013 1041   CREATININE 0.75 07/16/2012 0910      Component Value Date/Time   CALCIUM 10.3 05/20/2013 1041   CALCIUM 10.1 07/16/2012 0910   ALKPHOS 171* 05/20/2013 1041   ALKPHOS 142* 07/16/2012 0910   AST 26 05/20/2013 1041   AST 30 07/16/2012 0910   ALT 23 05/20/2013 1041   ALT 33 07/16/2012 0910   BILITOT 0.25 05/20/2013 1041   BILITOT 0.2* 07/16/2012 0910       RADIOGRAPHIC STUDIES: CT CHEST, ABDOMEN AND PELVIS WITH CONTRAST  Technique: Contiguous axial images of the chest abdomen and pelvis  were obtained after IV contrast administration.  Contrast: 100 ml Omnipaque-300  Comparison: 11/03/2012  CT CHEST  RECIST protocol 1.0  Target Lesions:  1.Right lower lobe  lung "nodule" 5 mm on image 28/series 5 versus 6  mm on the prior.  2. Subcarinal lymph node 9 mm short axis on image 28/series 2  versus 8 mm on the prior.  3. Right hilar node 9 mm on image 26/series 2 versus similar on  the prior exam (when remeasured).  Non Target lesions:  1. Findings suspicious for osseous metastasis. Similar.  Findings: Lung windows demonstrate mild scarring at the right lung  base. Clear left lung.  Soft tissue windows demonstrate no supraclavicular adenopathy.  Right mastectomy and axillary nodal dissection. No axillary  adenopathy. Tortuous descending thoracic aorta. Heart size upper  normal, without pericardial effusion. Trace right-sided pleural  thickening is unchanged. No central pulmonary embolism, on this non-  dedicated study. No mediastinal or hilar adenopathy. No internal  mammary adenopathy.  A sclerotic lesion involving the left posterior 11th rib is similar  on image 45/series 2. This is absent on 08/09/2010.  IMPRESSION:  1. Similar eleventh posterior left rib sclerosis, suspicious for  osseous metastasis.  2. No evidence of extra osseous metastasis within the chest.  CT ABDOMEN AND PELVIS  Findings: Moderate to marked hepatic steatosis. Similar vague  hyperenhancement the right lobe liver on image 52/series 2, likely  perfusion anomaly. There is sparing of steatosis adjacent the  gallbladder. Normal spleen, stomach. Duodenal diverticulum  involves the transverse segment. Normal pancreas, gallbladder,  biliary tract, right gland, kidneys. Minimal left adrenal  nodularity is unchanged. No retroperitoneal or retrocrural  adenopathy.  Normal colon, appendix, and terminal ileum. Normal small bowel  without abdominal ascites.  No evidence of omental or peritoneal disease.  No pelvic adenopathy. Normal urinary bladder and uterus. No  adnexal mass. No significant free fluid.  Advanced left hip osteoarthritis. Vague sclerosis involving the  left  iliac crest is unchanged. A well-defined sclerotic lesion in  the right iliac crest is similar. Subtle sclerosis in the anterior  left sacrum on image 94 is similar.  IMPRESSION:  1. Similar subtle osseous sclerosis, for which metastatic disease  cannot be excluded.  2. No extra osseous metastasis within the abdomen/pelvis.  3. Moderate to marked hepatic steatosis.      ASSESSMENT: 62 year old female with  #1 original diagnosis of breast cancer in 2005. She was treated with a lumpectomy followed by radiation. She apparently did go on antiestrogen therapy. Thereafter she was lost to follow up due to  lack of finances and loss of insurance.    2 patient was seen by Dr. Donnie Coffin in March 2013 with metastatic recurrent breast cancer that was ER positive PR positive with lung metastasis malignant pleural effusion and malignant pericardial effusion.she underwent pericardial window as well as Pleurx catheter placement.  #3 she was then begun on BOLERO 4 clinical trial consisting of everolimus 10 mg and letrozole 2.5 mg given on a daily basis on a 28 day cycle. So far she has completed 18 cycles. She is here for cycle number 19.   #4patient had CT scans performed on 12/29/2012.the scan does not show any progressive disease and clinically patient seems to be doing quite nicely.   #5 CT scan scan on 02/23/2013 reveal stable disease.   #6 patient went cold/bronchitis   PLAN:   #1 patient will proceed with cycle 22 of her chemotherapy consisting of everolimus and letrozole.  #2 Symptomatic management for shortness of breath and cough  #3We will obtain a chest x-ray today  #4Use saline nose drops for congestion  #5Flovent inhaler for wheezing use as directed  #6 Begin azithromycin as directed  #7 patient will return in 4 weeks' time for followup and she will have scans.  All questions were answered. The patient knows to call the clinic with any problems, questions or concerns. We can  certainly see the patient much sooner if necessary.  I spent 25 minutes counseling the patient face to face. The total time spent in the appointment was 25 minutes.  Drue Second, MD Medical/Oncology [facility] [phone] (beeper) [phone] (Office)

## 2013-05-20 NOTE — Telephone Encounter (Signed)
, °

## 2013-05-20 NOTE — Progress Notes (Signed)
05/20/13 at 2:58pm - Cycle 22, day 1 study notes - The pt was into the cancer center this am for her cycle 22, day 1 assessments.  The pt confirmed that she was fasting upon arrival to the cancer center for her required labs.  The pt returned her study drug kit for the compliance check.  The pt confirmed that she took her study drugs (everolimus and letrozole) from 04/22/13 through 05/19/13 (28 days).  The pt's everolimus kit revealed the pt took 56 pills, and she returned 14 pills.  The pt was dispensed 70 pills - 56 taken = 14returned.  The pt is 100% compliant with her study drug.   The pt reports symptoms of a  respiratory infection.  The pt stated that she developed some coughing on Tuesday, 05/18/13.  The pt said that she feels like it is "bronchitis".  Dr. Welton Flakes examined the pt, and she felt the pt had some "wheezing".  The research nurse and Dr. Welton Flakes reviewed the protocol about pneumonitis.  Dr. Welton Flakes ordered a chest x-ray to rule out any cardio-pulmonary process.  The pt's chest x-ray was negative.  The MD prescribed a 5 day course of antibiotics for the pt.  The pt's O2 saturation was normal 96%.  The pt denies that her coughing is affecting her ADL's.  She specifically denies any chest pain, and the pt is afebrile.  The pt was prescribed an antibiotic, azithromycin.  This antibiotic is not on the list of prohibited inducers and or inhibitors.   The pt was called after her appointment and told to continue taking her 2 everolimus pills daily since her prescribed antibiotic does not interact with her everolimus.  The pt stated that she was unable to afford the inhaler that Dr. Welton Flakes prescribed. Dr. Welton Flakes was notified that she could not fill this prescription.  The pt said that she will start her antibiotic tomorrow.  The pt was strongly advised to call Dr. Welton Flakes if her respiratory symptoms worsen.  The pt verbalized understanding.  The pt's labs were reviewed by Dr. Welton Flakes, and the pt's lab abnormals were not  clinically significant.  The pt's concomitant medications were reviewed with the pt.  The pt reports that she is still taking the following medications:  Lopressor, compazine, immodium, lopid, ibuprofen, vitamin D3.  She reports that she began Robutussin DM and Mucinex on 05/18/13.   She also reports the following AE's as ongoing:  Hot flashes, memory impairment, intermittent nausea and diarrhea, arthritis in hands (bilateral), cataracts (bilateral), fatigue, and knee pain (bilateral).  The pt's ECOG =1.  The pt was dispensed her everolimus drug kit for self administration.  The pt was given her cycle 23 appointments and CT appointments.

## 2013-05-20 NOTE — Patient Instructions (Signed)
Symptomatic management for shortness of breath and cough  We will obtain a chest x-ray today  Use saline nose drops for congestion  Flovent inhaler for wheezing use as directed  Begin azithromycin as directed  We'll see you back in one month's time for followup

## 2013-05-27 ENCOUNTER — Other Ambulatory Visit: Payer: Self-pay | Admitting: *Deleted

## 2013-05-27 DIAGNOSIS — C50919 Malignant neoplasm of unspecified site of unspecified female breast: Secondary | ICD-10-CM

## 2013-05-31 ENCOUNTER — Telehealth: Payer: Self-pay | Admitting: Oncology

## 2013-05-31 NOTE — Telephone Encounter (Signed)
LVMM adv pt Dec apts made and cal mailed shh

## 2013-06-03 ENCOUNTER — Other Ambulatory Visit: Payer: Self-pay

## 2013-06-15 ENCOUNTER — Ambulatory Visit (HOSPITAL_COMMUNITY): Admission: RE | Admit: 2013-06-15 | Payer: Self-pay | Source: Ambulatory Visit

## 2013-06-15 ENCOUNTER — Ambulatory Visit (HOSPITAL_COMMUNITY)
Admission: RE | Admit: 2013-06-15 | Discharge: 2013-06-15 | Disposition: A | Discharge status: Home / Self Care | Payer: Medicare Other | Source: Ambulatory Visit | Attending: Oncology | Admitting: Oncology

## 2013-06-15 ENCOUNTER — Encounter (HOSPITAL_COMMUNITY): Payer: Self-pay

## 2013-06-15 DIAGNOSIS — C7951 Secondary malignant neoplasm of bone: Secondary | ICD-10-CM | POA: Insufficient documentation

## 2013-06-15 DIAGNOSIS — C50919 Malignant neoplasm of unspecified site of unspecified female breast: Secondary | ICD-10-CM | POA: Insufficient documentation

## 2013-06-15 DIAGNOSIS — I700 Atherosclerosis of aorta: Secondary | ICD-10-CM | POA: Insufficient documentation

## 2013-06-15 DIAGNOSIS — R911 Solitary pulmonary nodule: Secondary | ICD-10-CM | POA: Insufficient documentation

## 2013-06-15 DIAGNOSIS — M47814 Spondylosis without myelopathy or radiculopathy, thoracic region: Secondary | ICD-10-CM | POA: Insufficient documentation

## 2013-06-15 DIAGNOSIS — K7689 Other specified diseases of liver: Secondary | ICD-10-CM | POA: Insufficient documentation

## 2013-06-15 DIAGNOSIS — Z9010 Acquired absence of unspecified breast and nipple: Secondary | ICD-10-CM | POA: Insufficient documentation

## 2013-06-15 MED ORDER — IOHEXOL 300 MG/ML  SOLN
50.0000 mL | Freq: Once | INTRAMUSCULAR | Status: AC | PRN
  Administered 2013-06-15: 50 mL via ORAL

## 2013-06-15 MED ORDER — IOHEXOL 300 MG/ML  SOLN
100.0000 mL | Freq: Once | INTRAMUSCULAR | Status: AC | PRN
  Administered 2013-06-15: 100 mL via INTRAVENOUS

## 2013-06-16 IMAGING — CT CT CHEST W/ CM
2 of 4 series · 16 of 46 positions shown, 18 images · IV contrast (agent unspecified)
Comparison: Plain film chest of 10/22/2011.

CLINICAL DATA: Breast cancer with lung metastasis.  Restaging
after chemotherapy.  Cough.  Short of breath with exertion.

CT CHEST, ABDOMEN AND PELVIS WITH CONTRAST
TECHNIQUE: Contiguous axial images of the chest abdomen and pelvis
were obtained after IV contrast administration.
Contrast: 100  ml Dmnipaque-X99

[Series 2: cap with st · axial · 0.73mm/px · z∈[-756,-206]mm · 13 of 122 slices shown, 15 images]
[im 6/122  soft-tissue]
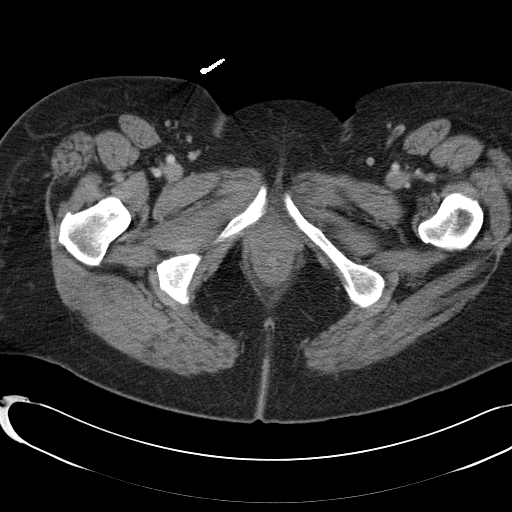
[im 6/122  bone]
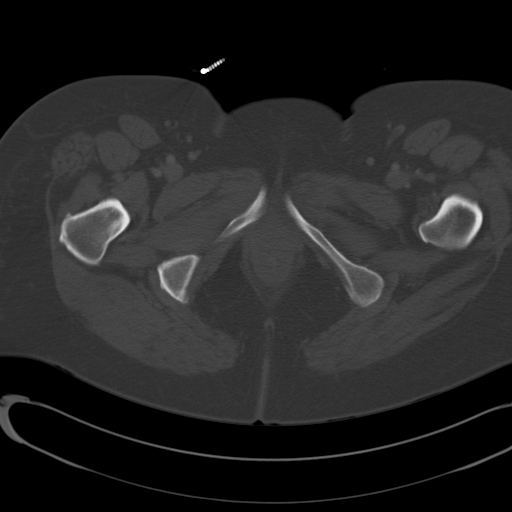
[im 16/122  soft-tissue]
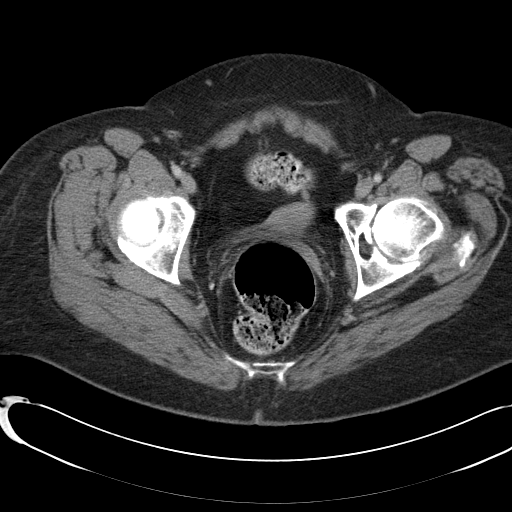
[im 26/122  soft-tissue]
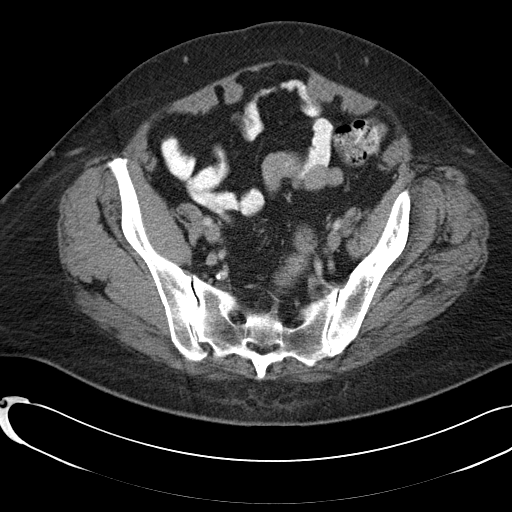
[im 36/122  soft-tissue]
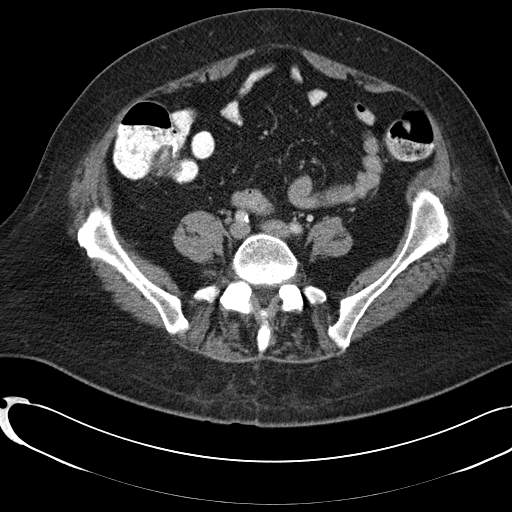
[im 41/122  soft-tissue]
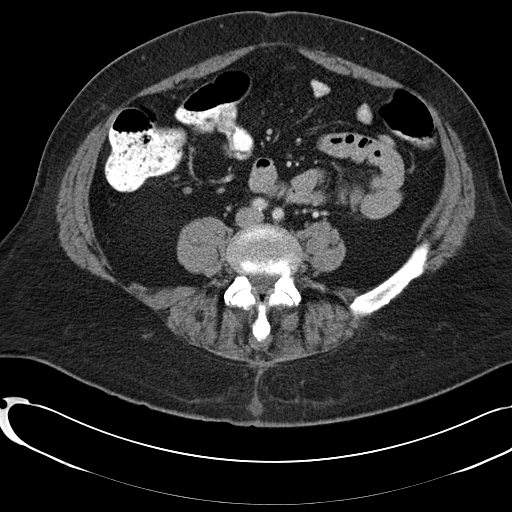
[im 51/122  soft-tissue]
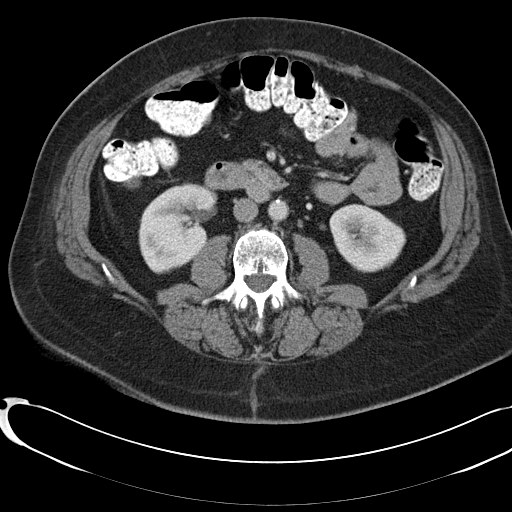
[im 61/122  soft-tissue]
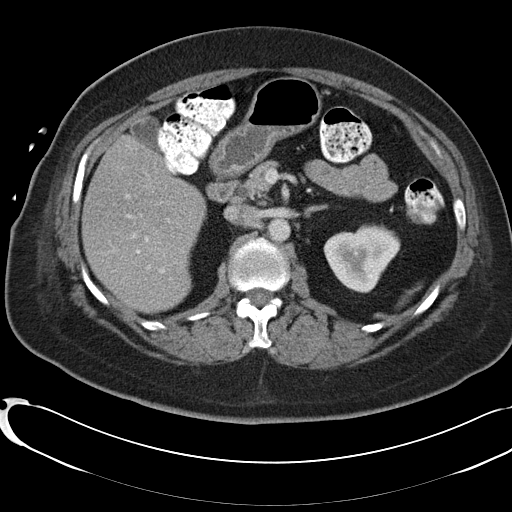
[im 71/122  soft-tissue]
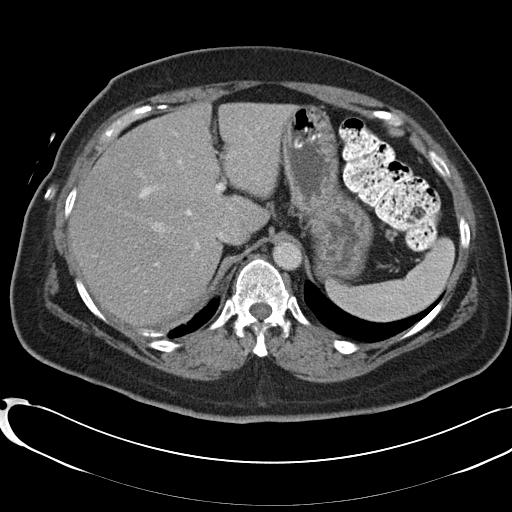
[im 81/122  soft-tissue]
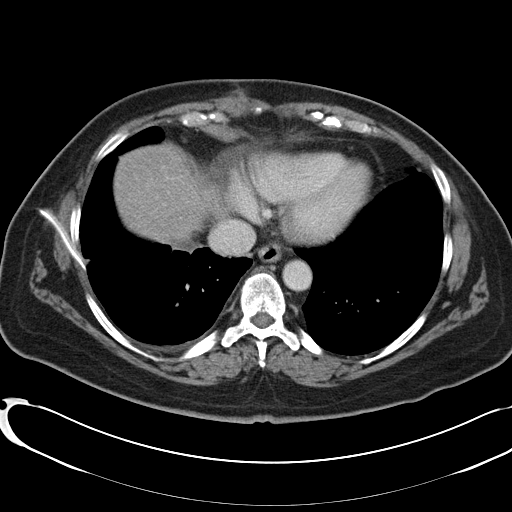
[im 81/122  bone]
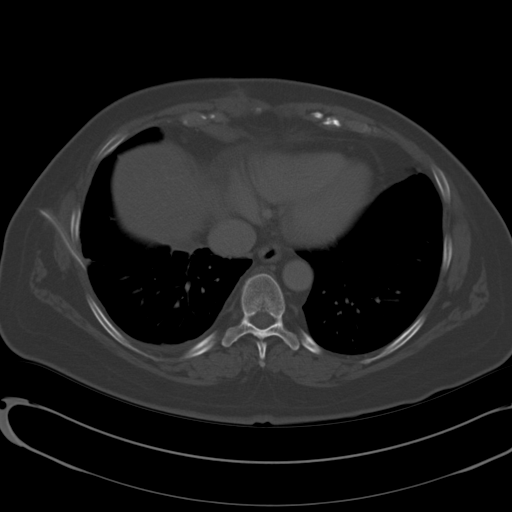
[im 86/122  soft-tissue]
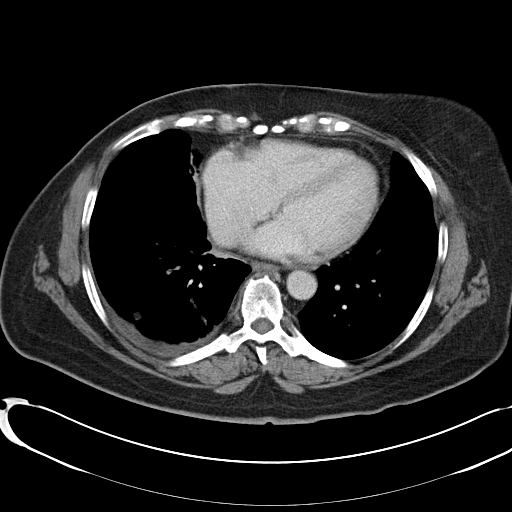
[im 96/122  soft-tissue]
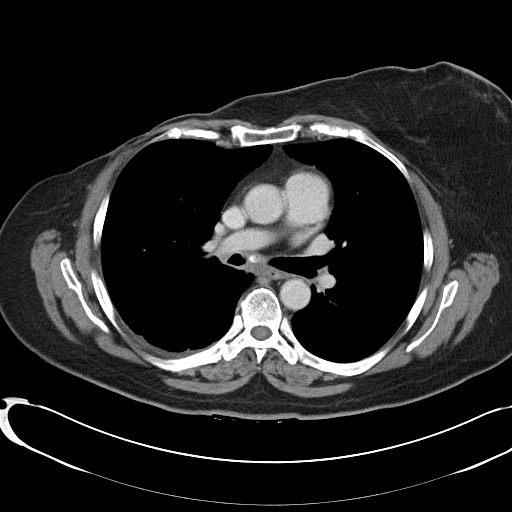
[im 106/122  soft-tissue]
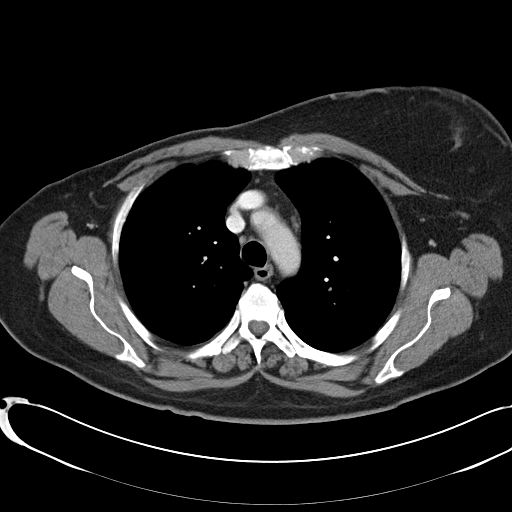
[im 116/122  soft-tissue]
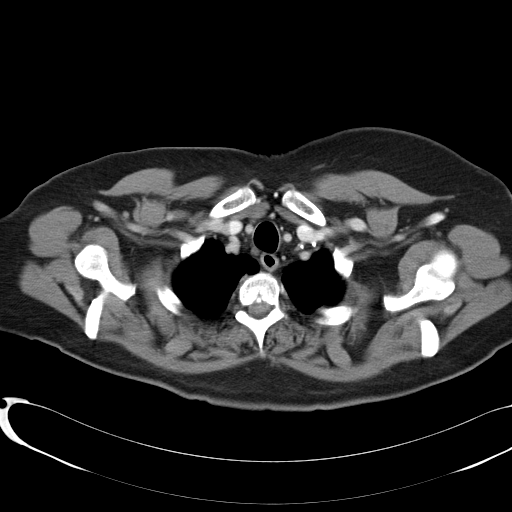

[Series 602: <mpr thick range> · coronal · 1.19mm/px · 3 of 87 slices shown]
[im 29/87  soft-tissue]
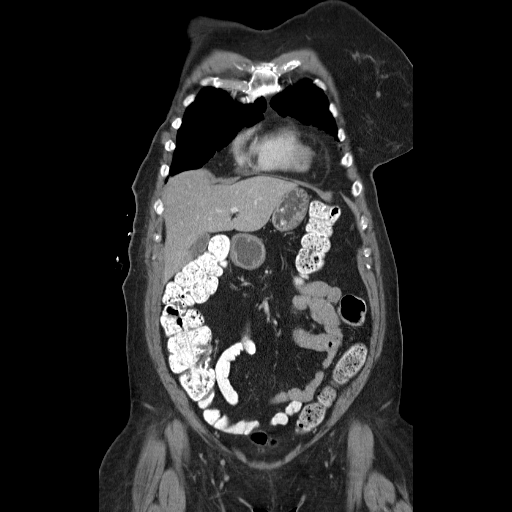
[im 39/87  soft-tissue]
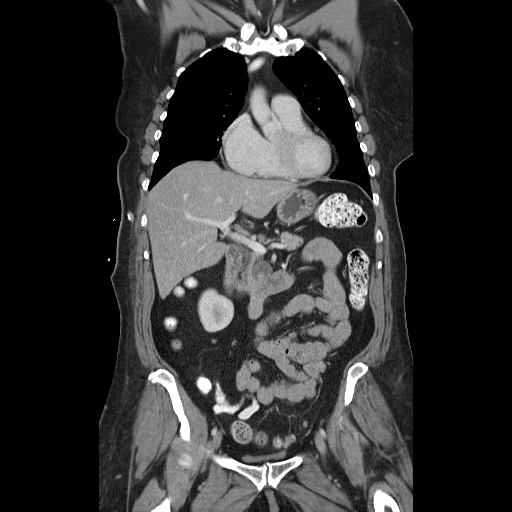
[im 48/87  soft-tissue]
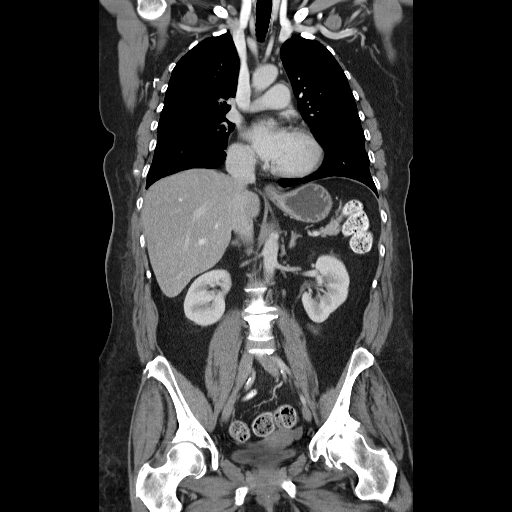

[16 of 46 positions shown; findings below may reference images not displayed]

Prior CTs of
10/03/2011.

CT CHEST

RECIST 1.0 target lesions:
- 7 mm right lower lobe lung nodule on image 37.  1.1 cm on the
prior.
- 1.1 cm subcarinal lymph node on image 28.  1.2 cm on the prior.
- Right hilar lymph node at 1.0 cm on image 26.  1.2 cm on the
prior.

Non measurable lesion:  Similar small right pleural effusion.
FINDINGS: Lung windows demonstrate similar mild soft tissue
thickening along the bronchus intermedius and right lower lobe
lobar bronchus.  Mild mass effect upon the right lower lobe
bronchus on image 27, without collapse.  This appearance is not
significantly changed.

Volume loss in the right middle lobe and right lower lobe.
A superior segment right lower lobe lung nodule measures 4 mm on
image 28 versus 6 mm on the prior.

Soft tissue windows demonstrate no supraclavicular adenopathy.
Right mastectomy and axillary nodal dissection.  No axillary
adenopathy.  Mild cardiomegaly, without pericardial effusion.  No
left pleural effusion. No central pulmonary embolism, on this non-
dedicated study.  A right paratracheal node measures 4 mm on image
13 versus 6 mm on the prior.  No left hilar adenopathy.  Tiny
hiatal hernia.  No internal mammary adenopathy.  Right-sided
pleurex catheter remains in place.

Subtle sclerosis and irregularity of the left posteromedial 11th
rib on image 46 of series 2.  This is similar to on the prior exam,
but new since 08/09/2010 study.
IMPRESSION: 1.  Response to therapy of pulmonary nodules and decreased size of
presumably metastatic thoracic nodes.
2.  No new or progressive disease.
3.  Similar small right pleural effusion.

4.  Similar nonspecific mild bronchial wall thickening to the right
lung base.
5. Similar area of sclerosis and irregularity involving the left
posterior 11th rib.  Correlate with prior trauma within this area.
Metastatic disease cannot be excluded.

CT ABDOMEN AND PELVIS
FINDINGS: Mild hepatic steatosis.  No focal liver lesion.  Normal
spleen, stomach.  A transverse duodenal diverticulum on image 65.
Normal pancreas, gallbladder, biliary tract, right adrenal gland.
Minimal left adrenal nodularity is present on 08/09/2010.  May be
slightly increased since the 6668 exam.  Image 59 today.

Normal kidneys. No retroperitoneal or retrocrural adenopathy.

Normal colon, appendix, and terminal ileum.  No evidence of omental
or peritoneal disease.  Normal small bowel without abdominal
ascites.

  No pelvic adenopathy.    Normal urinary bladder and uterus.  No
adnexal mass.  No significant free fluid.  Degenerative changes of
the left hip.
IMPRESSION: 1. No acute process or evidence of metastatic disease in the
abdomen or pelvis.
2.  Subtle left adrenal nodularity.  This warrants followup
attention.

## 2013-06-17 ENCOUNTER — Encounter: Payer: Self-pay | Admitting: Adult Health

## 2013-06-17 ENCOUNTER — Telehealth: Payer: Self-pay | Admitting: *Deleted

## 2013-06-17 ENCOUNTER — Ambulatory Visit (HOSPITAL_BASED_OUTPATIENT_CLINIC_OR_DEPARTMENT_OTHER): Payer: Self-pay | Admitting: Adult Health

## 2013-06-17 ENCOUNTER — Encounter: Payer: Self-pay | Admitting: *Deleted

## 2013-06-17 ENCOUNTER — Other Ambulatory Visit (HOSPITAL_BASED_OUTPATIENT_CLINIC_OR_DEPARTMENT_OTHER): Payer: Self-pay | Admitting: Lab

## 2013-06-17 VITALS — BP 112/77 | HR 99 | Temp 98.2°F | Resp 18 | Ht 65.0 in | Wt 184.0 lb

## 2013-06-17 DIAGNOSIS — C50919 Malignant neoplasm of unspecified site of unspecified female breast: Secondary | ICD-10-CM

## 2013-06-17 DIAGNOSIS — C7951 Secondary malignant neoplasm of bone: Secondary | ICD-10-CM

## 2013-06-17 DIAGNOSIS — C50419 Malignant neoplasm of upper-outer quadrant of unspecified female breast: Secondary | ICD-10-CM

## 2013-06-17 DIAGNOSIS — C7800 Secondary malignant neoplasm of unspecified lung: Secondary | ICD-10-CM

## 2013-06-17 LAB — COMPREHENSIVE METABOLIC PANEL (CC13)
ALT: 27 U/L (ref 0–55)
Alkaline Phosphatase: 175 U/L — ABNORMAL HIGH (ref 40–150)
Anion Gap: 12 mEq/L — ABNORMAL HIGH (ref 3–11)
BUN: 18.2 mg/dL (ref 7.0–26.0)
CO2: 21 mEq/L — ABNORMAL LOW (ref 22–29)
Calcium: 10.3 mg/dL (ref 8.4–10.4)
Chloride: 110 mEq/L — ABNORMAL HIGH (ref 98–109)
Creatinine: 0.9 mg/dL (ref 0.6–1.1)
Glucose: 153 mg/dl — ABNORMAL HIGH (ref 70–140)
Total Bilirubin: 0.23 mg/dL (ref 0.20–1.20)

## 2013-06-17 LAB — CK: Total CK: 79 U/L (ref 7–177)

## 2013-06-17 LAB — CBC WITH DIFFERENTIAL/PLATELET
Basophils Absolute: 0.1 10*3/uL (ref 0.0–0.1)
Eosinophils Absolute: 0.1 10*3/uL (ref 0.0–0.5)
HCT: 40.3 % (ref 34.8–46.6)
HGB: 13.4 g/dL (ref 11.6–15.9)
LYMPH%: 12.9 % — ABNORMAL LOW (ref 14.0–49.7)
MONO#: 0.7 10*3/uL (ref 0.1–0.9)
NEUT#: 4.8 10*3/uL (ref 1.5–6.5)
NEUT%: 72.8 % (ref 38.4–76.8)
Platelets: 294 10*3/uL (ref 145–400)
WBC: 6.6 10*3/uL (ref 3.9–10.3)
lymph#: 0.8 10*3/uL — ABNORMAL LOW (ref 0.9–3.3)

## 2013-06-17 LAB — LIPID PANEL
Cholesterol: 240 mg/dL — ABNORMAL HIGH (ref 0–200)
HDL: 47 mg/dL (ref >39)
LDL Cholesterol: 138 mg/dL — ABNORMAL HIGH (ref 0–99)
Total CHOL/HDL Ratio: 5.1 Ratio
Triglycerides: 277 mg/dL — ABNORMAL HIGH (ref <150)

## 2013-06-17 LAB — BILIRUBIN, DIRECT: Bilirubin, Direct: <0.1 mg/dL (ref 0.0–0.3)

## 2013-06-17 LAB — URIC ACID (CC13): Uric Acid, Serum: 3.3 mg/dl (ref 2.6–7.4)

## 2013-06-17 MED ORDER — INV-EVEROLIMUS (RAD0001) 5MG TABLET NOVARTIS CRAD001Y24135: 2.0000 | ORAL_TABLET | Freq: Every day | ORAL | Status: DC

## 2013-06-17 NOTE — Progress Notes (Addendum)
OFFICE PROGRESS NOTE  CC  Barbara Espy, MD [address] [place] [address]  DIAGNOSIS: 62 year old female with metastatic breast cancer with bone metastasis and lung metastasis.  PRIOR THERAPY:  #1 patient originally presented when she was 62 years old with early stage breast cancer in 2005. At that time she underwent a lumpectomy with axillary lymph node dissection. She was followed by Dr. Pierce Crane and Theron Arista young. Her last visit to Dr. Renelda Loma office was in 2006. Because of difficulties with insurance she had not been receiving mammograms.  #2 in 2012 patient began having shortness of breath on exertion. She was seen by pulmonary service and a CT scan showed some minor mediastinal adenopathy as well as borderline lower lobe nodule. PET scan was not performed. October 2012 patient began to experience more shortness of breath. Initially she was told that she had pneumonia. However subsequently she developed lower extremity edema. CT of the chest was obtained that showed a large pleural effusion in the right side as well as pericardial effusion. She was seen by cardiothoracic surgery placed in 09/11/2011. The cytology revealed that patient indeed have recurrent breast cancer that was hormone receptor positive HER-2/neu was not obtained as it was insufficient tissue. She subsequently had a CT scan performed that showed 2 nodules in the lung suspicious for metastatic disease as well as left lower lobe collapse. She had a biopsy of the lungs performed on 09/11/2011 the tumor was estrogen receptor +91% progesterone receptor +100% proliferation marker Ki-67 35%.  #3 patient was seen by Dr. Pierce Crane and he recommended that she be enrolled on the BOLERO-4 clinical trial utilizing letrozole 2.5 mg per day and affinitor10 mg per day. She initiated this on 10/10/2011.she has had a significant response to this lower extremity edema has reduced significantly in size she is no  longer short of breath.  CURRENT THERAPY:BOLERO -4 with everolimus 10 mg and letrozole 2.5 mg daily. Patient will begin cycle 23 day 1 today. ( 28 day cycles)  INTERVAL HISTORY: Barbara Parks 62 y.o. female returns for followup visit prior to cycle 23.  She is stable.  Her fatigue, arthritis, hot flashes, forgetfulness, nausea, diarrhea, and cataracts remain stable.  At her last appointment she had a mild cough and nasal drainage.  This has resolved since then.  She had CT scans performed on 06/15/13 which are stable.  Otherwise, a 10 point ROS is negative.    MEDICAL HISTORY: Past Medical History  Diagnosis Date  . Asthma   . Shortness of breath   . Headache(784.0)     OTC Prilosec  . Arthritis     left hip  . Contact dermatitis     from a bandaid that had Latex   . breast ca 2005    breast/chemo R mastectomy  . Metastasis to lung dx'd 08/2011    ALLERGIES:  is allergic to aspirin; latex; and nsaids.  MEDICATIONS:  Current Outpatient Prescriptions  Medication Sig Dispense Refill  . cholecalciferol (VITAMIN D) 1000 UNITS tablet Take 2,000 Units by mouth daily.       Marland Kitchen gemfibrozil (LOPID) 600 MG tablet Take 1 tablet (600 mg total) by mouth 2 (two) times daily before a meal.  60 tablet  1  . ibuprofen (ADVIL,MOTRIN) 200 MG tablet Take 400 mg by mouth every 6 (six) hours as needed. For pain      . Investigational everolimus (RAD001) 5 MG tablet Novartis [id number] Take 2 tablets by mouth daily.  Take with a glass of water.  70 tablet  0  . letrozole (FEMARA) 2.5 MG tablet Take 1 tablet (2.5 mg total) by mouth daily.  90 tablet  6  . loperamide (IMODIUM) 2 MG capsule Take 2 mg by mouth 4 (four) times daily as needed for diarrhea or loose stools.      . metoprolol tartrate (LOPRESSOR) 25 MG tablet Take 0.5 tablets (12.5 mg total) by mouth daily.  30 tablet  4  . prochlorperazine (COMPAZINE) 10 MG tablet Take 10 mg by mouth every 6 (six) hours as needed. For nausea      .  Investigational everolimus (RAD001) 5 MG tablet Novartis [id number] Take 2 tablets by mouth daily. Take with a glass of water.  70 tablet  0   No current facility-administered medications for this visit.    SURGICAL HISTORY:  Past Surgical History  Procedure Laterality Date  . Spine surgery  1996  . Breast surgery  2005    right  . Video bronchoscopy  09/11/2011    Procedure: VIDEO BRONCHOSCOPY;  Surgeon: Alleen Borne, MD;  Location: [facility] OR;  Service: Thoracic;  Laterality: N/A;  . Chest tube insertion  09/11/2011    Procedure: INSERTION PLEURAL DRAINAGE CATHETER;  Surgeon: Alleen Borne, MD;  Location: [facility] OR;  Service: Thoracic;  Laterality: Right;  . Pericardial window  09/11/2011    Procedure: PERICARDIAL WINDOW;  Surgeon: Alleen Borne, MD;  Location: [facility] OR;  Service: Thoracic;  Laterality: N/A;  . Removal of pleural drainage catheter  12/19/2011    Procedure: REMOVAL OF PLEURAL DRAINAGE CATHETER;  Surgeon: Alleen Borne, MD;  Location: [facility] OR;  Service: Thoracic;  Laterality: Right;  TO BE DONE IN MINOR ROOM, SHORT STAY    REVIEW OF SYSTEMS:  A 10 point review of systems was conducted and is otherwise negative except for what is noted above.      PHYSICAL EXAMINATION: Blood pressure 112/77, pulse 99, temperature 98.2 F (36.8 C), temperature source Oral, resp. rate 18, height 5\' 5"  (1.651 m), weight 184 lb (83.462 kg), SpO2 97.00%. Body mass index is 30.62 kg/(m^2). General: Patient is a well appearing female in no acute distress HEENT: PERRLA, sclerae anicteric no conjunctival pallor, MMM Neck: supple, no palpable adenopathy Lungs: Bilateral wheezes no rales or rhonchi Cardiovascular: regular rate rhythm, S1, S2, no murmurs, rubs or gallops Abdomen: Soft, non-tender, non-distended, normoactive bowel sounds, no HSM Extremities: warm and well perfused, no clubbing, cyanosis, or edema Skin: No rashes or lesions Neuro: Non-focal Breasts: bilateral breasts no masses or  lesions, no nodularity, benign exam ECOG PERFORMANCE STATUS: 1 - Symptomatic but completely ambulatory    LABORATORY DATA: Lab Results  Component Value Date   WBC 6.6 06/17/2013   HGB 13.4 06/17/2013   HCT 40.3 06/17/2013   MCV 78.3* 06/17/2013   PLT 294 06/17/2013      Chemistry      Component Value Date/Time   NA 143 06/17/2013 0854   NA 136 07/16/2012 0910   K 4.1 06/17/2013 0854   K 4.0 07/16/2012 0910   CL 109* 12/31/2012 0940   CL 103 07/16/2012 0910   CO2 21* 06/17/2013 0854   CO2 21 07/16/2012 0910   BUN 18.2 06/17/2013 0854   BUN 16 07/16/2012 0910   CREATININE 0.9 06/17/2013 0854   CREATININE 0.75 07/16/2012 0910      Component Value Date/Time   CALCIUM 10.3 06/17/2013 0854   CALCIUM 10.1 07/16/2012 0910  ALKPHOS 175* 06/17/2013 0854   ALKPHOS 142* 07/16/2012 0910   AST 25 06/17/2013 0854   AST 30 07/16/2012 0910   ALT 27 06/17/2013 0854   ALT 33 07/16/2012 0910   BILITOT 0.23 06/17/2013 0854   BILITOT 0.2* 07/16/2012 0910       RADIOGRAPHIC STUDIES: CT CHEST, ABDOMEN AND PELVIS WITH CONTRAST  Technique: Contiguous axial images of the chest abdomen and pelvis  were obtained after IV contrast administration.  Contrast: 100 ml Omnipaque-300  Comparison: 11/03/2012  CT CHEST  RECIST protocol 1.0  Target Lesions:  1.Right lower lobe lung "nodule" 5 mm on image 28/series 5 versus 6  mm on the prior.  2. Subcarinal lymph node 9 mm short axis on image 28/series 2  versus 8 mm on the prior.  3. Right hilar node 9 mm on image 26/series 2 versus similar on  the prior exam (when remeasured).  Non Target lesions:  1. Findings suspicious for osseous metastasis. Similar.  Findings: Lung windows demonstrate mild scarring at the right lung  base. Clear left lung.  Soft tissue windows demonstrate no supraclavicular adenopathy.  Right mastectomy and axillary nodal dissection. No axillary  adenopathy. Tortuous descending thoracic aorta. Heart size upper   normal, without pericardial effusion. Trace right-sided pleural  thickening is unchanged. No central pulmonary embolism, on this non-  dedicated study. No mediastinal or hilar adenopathy. No internal  mammary adenopathy.  A sclerotic lesion involving the left posterior 11th rib is similar  on image 45/series 2. This is absent on 08/09/2010.  IMPRESSION:  1. Similar eleventh posterior left rib sclerosis, suspicious for  osseous metastasis.  2. No evidence of extra osseous metastasis within the chest.  CT ABDOMEN AND PELVIS  Findings: Moderate to marked hepatic steatosis. Similar vague  hyperenhancement the right lobe liver on image 52/series 2, likely  perfusion anomaly. There is sparing of steatosis adjacent the  gallbladder. Normal spleen, stomach. Duodenal diverticulum  involves the transverse segment. Normal pancreas, gallbladder,  biliary tract, right gland, kidneys. Minimal left adrenal  nodularity is unchanged. No retroperitoneal or retrocrural  adenopathy.  Normal colon, appendix, and terminal ileum. Normal small bowel  without abdominal ascites.  No evidence of omental or peritoneal disease.  No pelvic adenopathy. Normal urinary bladder and uterus. No  adnexal mass. No significant free fluid.  Advanced left hip osteoarthritis. Vague sclerosis involving the  left iliac crest is unchanged. A well-defined sclerotic lesion in  the right iliac crest is similar. Subtle sclerosis in the anterior  left sacrum on image 94 is similar.  IMPRESSION:  1. Similar subtle osseous sclerosis, for which metastatic disease  cannot be excluded.  2. No extra osseous metastasis within the abdomen/pelvis.  3. Moderate to marked hepatic steatosis.      ASSESSMENT: 61 year old female with  #1 original diagnosis of breast cancer in 2005. She was treated with a lumpectomy followed by radiation. She apparently did go on antiestrogen therapy. Thereafter she was lost to follow up due to lack of  finances and loss of insurance.    2 patient was seen by Dr. Donnie Coffin in March 2013 with metastatic recurrent breast cancer that was ER positive PR positive with lung metastasis malignant pleural effusion and malignant pericardial effusion.she underwent pericardial window as well as Pleurx catheter placement.  #3 she was then begun on BOLERO 4 clinical trial consisting of everolimus 10 mg and letrozole 2.5 mg given on a daily basis on a 28 day cycle. So far she has completed  18 cycles. She is here for cycle number 19.   #4patient had CT scans performed on 12/29/2012.the scan does not show any progressive disease and clinically patient seems to be doing quite nicely.   #5 CT scan scan on 02/23/2013, and 06/15/13 reveals stable disease.   PLAN:   #1 patient will proceed with cycle 23 of her chemotherapy consisting of everolimus and letrozole.  She is tolerating this therapy well.  I reviewed her CT scans revealing stable disease and this was communicated with the patient.  I also reviewed ehr labs with her in detail.    #2 patient will return on 07/15/13  for lab monitoring and an appointment.    All questions were answered. The patient knows to call the clinic with any problems, questions or concerns. We can certainly see the patient much sooner if necessary.  I spent 25 minutes counseling the patient face to face. The total time spent in the appointment was 30 minutes.  Illa Level, NP Medical Oncology [facility] [phone]  ATTENDING'S ATTESTATION:  I personally reviewed patient's chart, examined patient myself, formulated the treatment plan as followed.    Overall patient is doing fantastic. She's been tolerating the affiniotor and Aromasin on clinical trauma very well. She has stable disease.patient will proceed with next cycle of treatment.  Drue Second, MD Medical/Oncology [facility] [phone] (beeper) [phone]  (Office)  07/04/2013, 10:37 PM

## 2013-06-17 NOTE — Telephone Encounter (Signed)
Pt requested to change her labs on 07/15/13 to 11am. She will come back on that day to see kk @ 3:15pm...td

## 2013-06-17 NOTE — Progress Notes (Signed)
06/17/13 at 9:50am - Cycle 23, day 1 study notes - The pt was into the cancer center this morning for her cycle 23 assessments.  The pt confirmed that she was fasting upon arrival to the cancer center for her required labs.  The pt returned her study drug (everolimus) kit for the compliance check.  The pt confirmed that she took her study drugs (everolimus and letrozole) from 05/20/13 through 06/16/13 (28 days).  The pt's everolimus kit revealed the pt took 56 pills, and she returned 14 pills.  The pt was dispensed 70 pills - 56 taken= 14 returned.  The pt is 100% compliant with her study drug.  The pt states that her upper respiratory infection has completed resolved.  The pt denies any new or worsening adverse events.  The pt was seen and examined today by Rexanne Mano, NP and Dr. Welton Flakes.  The pt's scans were reviewed by Dr. Welton Flakes, and Dr. Welton Flakes states the pt's disease is "stable" and she has no signs of progressive disease.  Dr. Welton Flakes signed the RECIST 1.0 worksheet and confirmed her PR status.   The pt reports the following AE's as ongoing:  hot flashes, memory impairment, intermittent diarrhea and nausea, bilateral cataracts, fatigue, and bilateral knee pain.  The pt's labs were reviewed with the pt.  The pt's lab abnormals were not felt to be "clinically significant" by Rexanne Mano, NP.  The pt was told that her glucose was 153 today which is higher that normal.  She was told by Dr. Welton Flakes to watch her "sugar and carb intake".  Dr. Welton Flakes was told that she might need to initiate medication if her glucose increases to greater than 160 fasting.  The pt was dispensed her next everolimus study drug kit today.  The pt was informed to continue taking her study drugs as prescribed.  The pt verbalized understanding.  The pt's lipid panel is pending.  The pt is aware of her next appointment times.  The pt's concomitant medications were reviewed with the pt.  She confirmed that she stopped her antibiotic on 05/26/13.     06/17/13 at 3:10pm - The pt's lipid panel is stable.  The research nurse informed Dr. Welton Flakes that she is able to order a bone scan anytime during the study. Dr. Welton Flakes said that she did not want a bone scan now. The physician states the pt is doing well with her current treatment, and her disease is very stable at present.

## 2013-06-21 ENCOUNTER — Telehealth: Payer: Self-pay | Admitting: *Deleted

## 2013-06-21 NOTE — Telephone Encounter (Signed)
06/21/13 at 3:53pm - The pt called and stated that she broke her tooth and was seen by a dentist today.  She said her dentist wanted her to take an antibiotic for 10 days before she completed any work on the tooth.  The pt wanted to notify the research nurse about the antibiotic for any potential drug reaction with her everolimus.  The research nurse checked with the pharmacy, and they verified that there are no interactions with everolimus and amoxicillin.  The pt was informed that she is free to begin her antibiotic as prescribed.  The pt stated that she was prescribed 500 mg TID x 10 days.  The pt started her antibiotic today.  The pt was thanked for notifying the nurse of her new medication.

## 2013-07-14 ENCOUNTER — Other Ambulatory Visit: Payer: Self-pay | Admitting: *Deleted

## 2013-07-14 DIAGNOSIS — C50919 Malignant neoplasm of unspecified site of unspecified female breast: Secondary | ICD-10-CM

## 2013-07-15 ENCOUNTER — Telehealth: Payer: Self-pay | Admitting: *Deleted

## 2013-07-15 ENCOUNTER — Encounter: Payer: Self-pay | Admitting: *Deleted

## 2013-07-15 ENCOUNTER — Other Ambulatory Visit: Payer: Self-pay | Admitting: Lab

## 2013-07-15 ENCOUNTER — Ambulatory Visit (HOSPITAL_BASED_OUTPATIENT_CLINIC_OR_DEPARTMENT_OTHER): Payer: Self-pay | Admitting: Oncology

## 2013-07-15 ENCOUNTER — Other Ambulatory Visit (HOSPITAL_BASED_OUTPATIENT_CLINIC_OR_DEPARTMENT_OTHER): Payer: Self-pay

## 2013-07-15 VITALS — BP 126/76 | HR 102 | Temp 97.6°F | Resp 20 | Ht 65.0 in | Wt 184.8 lb

## 2013-07-15 DIAGNOSIS — C50919 Malignant neoplasm of unspecified site of unspecified female breast: Secondary | ICD-10-CM

## 2013-07-15 DIAGNOSIS — C7951 Secondary malignant neoplasm of bone: Secondary | ICD-10-CM

## 2013-07-15 DIAGNOSIS — C7800 Secondary malignant neoplasm of unspecified lung: Secondary | ICD-10-CM

## 2013-07-15 DIAGNOSIS — M8580 Other specified disorders of bone density and structure, unspecified site: Secondary | ICD-10-CM

## 2013-07-15 LAB — COMPREHENSIVE METABOLIC PANEL (CC13)
ALT: 33 U/L (ref 0–55)
AST: 35 U/L — ABNORMAL HIGH (ref 5–34)
Albumin: 3.7 g/dL (ref 3.5–5.0)
Alkaline Phosphatase: 195 U/L — ABNORMAL HIGH (ref 40–150)
BUN: 20.5 mg/dL (ref 7.0–26.0)
CO2: 23 mEq/L (ref 22–29)
Calcium: 10.5 mg/dL — ABNORMAL HIGH (ref 8.4–10.4)
Creatinine: 1 mg/dL (ref 0.6–1.1)
Potassium: 4.1 mEq/L (ref 3.5–5.1)
Sodium: 142 mEq/L (ref 136–145)
Total Protein: 8.4 g/dL — ABNORMAL HIGH (ref 6.4–8.3)

## 2013-07-15 LAB — LIPID PANEL
Cholesterol: 247 mg/dL — ABNORMAL HIGH (ref 0–200)
Total CHOL/HDL Ratio: 5.4 Ratio
Triglycerides: 255 mg/dL — ABNORMAL HIGH (ref <150)
VLDL: 51 mg/dL — ABNORMAL HIGH (ref 0–40)

## 2013-07-15 LAB — CBC WITH DIFFERENTIAL/PLATELET
BASO%: 0.4 % (ref 0.0–2.0)
Basophils Absolute: 0 10*3/uL (ref 0.0–0.1)
EOS%: 1.8 % (ref 0.0–7.0)
HGB: 13.9 g/dL (ref 11.6–15.9)
MCH: 26 pg (ref 25.1–34.0)
MCHC: 32.9 g/dL (ref 31.5–36.0)
MCV: 79 fL — ABNORMAL LOW (ref 79.5–101.0)
MONO%: 9.4 % (ref 0.0–14.0)
NEUT#: 5.2 10*3/uL (ref 1.5–6.5)
RBC: 5.34 10*6/uL (ref 3.70–5.45)
RDW: 14.2 % (ref 11.2–14.5)

## 2013-07-15 LAB — CK: Total CK: 88 U/L (ref 7–177)

## 2013-07-15 LAB — URIC ACID (CC13): Uric Acid, Serum: 3.4 mg/dl (ref 2.6–7.4)

## 2013-07-15 MED ORDER — INV-EVEROLIMUS (RAD0001) 5MG TABLET NOVARTIS CRAD001Y24135: 2.0000 | ORAL_TABLET | Freq: Every day | ORAL | Status: DC

## 2013-07-15 NOTE — Progress Notes (Signed)
07/15/13 at 4:53pm - Bolero 4 - cycle 24, day 1 study notes- The pt was into the cancer center this am for her fasting labs and cycle 24 assessments.  The pt confirmed that she was fasting overnight and this am for her required study labs.  The pt returned her cycle 23 everolimus study box.  The pt confirmed that she took her everolimus as prescribed with her letrozole daily from 06/17/13 through 07/14/13 (28 days). The pt's everolimus kit revealed the pt took 56 pills, and she returned 14 pills.  The pt was dispensed 70 pills -56 taken =14 returned.   The pt is 100% compliant with her study drug.  The pt's labs were reviewed by Dr. Welton Flakes, and there were no clinically significant abnormal lab valules noted.  The pt denies any new adverse events or any worsening adverse events.  The pt was seen and examined today by Dr. Welton Flakes.  The pt reports the following AE's as ongoing:  hot flashes, memory impairment, intermittent diarrhea and nausea, bilateral cataracts, fatigue, and bilateral knee pain.  The pt's glucose was lower today.  Dr. Welton Flakes stated the pt should continue on her same medication regimen of everolimus and letrozole.  The pt's lipid panel is pending.  The pt's concomitant medications were reviewed with the patient.  She denies any new medications.  The pt is aware of her future appointments.  The pt's ECOG=1.  The pt is driving 9 hours to see her family next week.    07/15/13 at 5:15pm - The pt's lipid panel is stable.

## 2013-07-15 NOTE — Progress Notes (Signed)
OFFICE PROGRESS NOTE  CC  Barbara Espy, MD [address] [place] [address]  DIAGNOSIS: 62 year old female with metastatic breast cancer with bone metastasis and lung metastasis.  PRIOR THERAPY:  #1 patient originally presented when she was 62 years old with early stage breast cancer in 2005. At that time she underwent a lumpectomy with axillary lymph node dissection. She was followed by Dr. Pierce Crane and Theron Arista young. Her last visit to Dr. Renelda Loma office was in 2006. Because of difficulties with insurance she had not been receiving mammograms.  #2 in 2012 patient began having shortness of breath on exertion. She was seen by pulmonary service and a CT scan showed some minor mediastinal adenopathy as well as borderline lower lobe nodule. PET scan was not performed. October 2012 patient began to experience more shortness of breath. Initially she was told that she had pneumonia. However subsequently she developed lower extremity edema. CT of the chest was obtained that showed a large pleural effusion in the right side as well as pericardial effusion. She was seen by cardiothoracic surgery placed in 09/11/2011. The cytology revealed that patient indeed have recurrent breast cancer that was hormone receptor positive HER-2/neu was not obtained as it was insufficient tissue. She subsequently had a CT scan performed that showed 2 nodules in the lung suspicious for metastatic disease as well as left lower lobe collapse. She had a biopsy of the lungs performed on 09/11/2011 the tumor was estrogen receptor +91% progesterone receptor +100% proliferation marker Ki-67 35%.  #3 patient was seen by Dr. Pierce Crane and he recommended that she be enrolled on the BOLERO-4 clinical trial utilizing letrozole 2.5 mg per day and affinitor10 mg per day. She initiated this on 10/10/2011.she has had a significant response to this lower extremity edema has reduced significantly in size she is no  longer short of breath.  CURRENT THERAPY:BOLERO -4 with everolimus 10 mg and letrozole 2.5 mg daily. Patient will begin cycle 23 day 1 today. ( 28 day cycles)  INTERVAL HISTORY: Barbara Parks 62 y.o. female returns for followup visit prior to cycle 24.  She is stable.  Her fatigue, arthritis, hot flashes, forgetfulness, nausea, diarrhea, and cataracts remain stable.  At her last appointment she had a mild cough and nasal drainage.  This has resolved since then.  She had CT scans performed on 06/15/13 which are stable.  Otherwise, a 10 point ROS is negative.    MEDICAL HISTORY: Past Medical History  Diagnosis Date  . Asthma   . Shortness of breath   . Headache(784.0)     OTC Prilosec  . Arthritis     left hip  . Contact dermatitis     from a bandaid that had Latex   . breast ca 2005    breast/chemo R mastectomy  . Metastasis to lung dx'd 08/2011    ALLERGIES:  is allergic to aspirin; latex; and nsaids.  MEDICATIONS:  Current Outpatient Prescriptions  Medication Sig Dispense Refill  . cholecalciferol (VITAMIN D) 1000 UNITS tablet Take 2,000 Units by mouth daily.       Marland Kitchen gemfibrozil (LOPID) 600 MG tablet Take 1 tablet (600 mg total) by mouth 2 (two) times daily before a meal.  60 tablet  1  . ibuprofen (ADVIL,MOTRIN) 200 MG tablet Take 400 mg by mouth every 6 (six) hours as needed. For pain      . Investigational everolimus (RAD001) 5 MG tablet [employer] [id number] Take 2 tablets by mouth daily.  Take with a glass of water.  70 tablet  0  . letrozole (FEMARA) 2.5 MG tablet Take 1 tablet (2.5 mg total) by mouth daily.  90 tablet  6  . metoprolol tartrate (LOPRESSOR) 25 MG tablet Take 0.5 tablets (12.5 mg total) by mouth daily.  30 tablet  4  . Investigational everolimus (RAD001) 5 MG tablet [employer] [id number] Take 2 tablets by mouth daily. Take with a glass of water.  70 tablet  0  . loperamide (IMODIUM) 2 MG capsule Take 2 mg by mouth 4 (four) times daily as needed for diarrhea  or loose stools.      . prochlorperazine (COMPAZINE) 10 MG tablet Take 10 mg by mouth every 6 (six) hours as needed. For nausea       No current facility-administered medications for this visit.    SURGICAL HISTORY:  Past Surgical History  Procedure Laterality Date  . Spine surgery  1996  . Breast surgery  2005    right  . Video bronchoscopy  09/11/2011    Procedure: VIDEO BRONCHOSCOPY;  Surgeon: Alleen Borne, MD;  Location: [facility] OR;  Service: Thoracic;  Laterality: N/A;  . Chest tube insertion  09/11/2011    Procedure: INSERTION PLEURAL DRAINAGE CATHETER;  Surgeon: Alleen Borne, MD;  Location: [facility] OR;  Service: Thoracic;  Laterality: Right;  . Pericardial window  09/11/2011    Procedure: PERICARDIAL WINDOW;  Surgeon: Alleen Borne, MD;  Location: [facility] OR;  Service: Thoracic;  Laterality: N/A;  . Removal of pleural drainage catheter  12/19/2011    Procedure: REMOVAL OF PLEURAL DRAINAGE CATHETER;  Surgeon: Alleen Borne, MD;  Location: [facility] OR;  Service: Thoracic;  Laterality: Right;  TO BE DONE IN MINOR ROOM, SHORT STAY    REVIEW OF SYSTEMS:  A 10 point review of systems was conducted and is otherwise negative except for what is noted above.      PHYSICAL EXAMINATION: Blood pressure 126/76, pulse 102, temperature 97.6 F (36.4 C), temperature source Oral, resp. rate 20, height 5\' 5"  (1.651 m), weight 184 lb 12.8 oz (83.825 kg). Body mass index is 30.75 kg/(m^2). General: Patient is a well appearing female in no acute distress HEENT: PERRLA, sclerae anicteric no conjunctival pallor, MMM Neck: supple, no palpable adenopathy Lungs: Bilateral wheezes no rales or rhonchi Cardiovascular: regular rate rhythm, S1, S2, no murmurs, rubs or gallops Abdomen: Soft, non-tender, non-distended, normoactive bowel sounds, no HSM Extremities: warm and well perfused, no clubbing, cyanosis, or edema Skin: No rashes or lesions Neuro: Non-focal Breasts: bilateral breasts no masses or lesions, no  nodularity, benign exam ECOG PERFORMANCE STATUS: 1 - Symptomatic but completely ambulatory    LABORATORY DATA: Lab Results  Component Value Date   WBC 7.1 07/15/2013   HGB 13.9 07/15/2013   HCT 42.2 07/15/2013   MCV 79.0* 07/15/2013   PLT 277 07/15/2013      Chemistry      Component Value Date/Time   NA 142 07/15/2013 1109   NA 136 07/16/2012 0910   K 4.1 07/15/2013 1109   K 4.0 07/16/2012 0910   CL 109* 12/31/2012 0940   CL 103 07/16/2012 0910   CO2 23 07/15/2013 1109   CO2 21 07/16/2012 0910   BUN 20.5 07/15/2013 1109   BUN 16 07/16/2012 0910   CREATININE 1.0 07/15/2013 1109   CREATININE 0.75 07/16/2012 0910      Component Value Date/Time   CALCIUM 10.5* 07/15/2013 1109   CALCIUM 10.1 07/16/2012 0910  ALKPHOS 195* 07/15/2013 1109   ALKPHOS 142* 07/16/2012 0910   AST 35* 07/15/2013 1109   AST 30 07/16/2012 0910   ALT 33 07/15/2013 1109   ALT 33 07/16/2012 0910   BILITOT 0.29 07/15/2013 1109   BILITOT 0.2* 07/16/2012 0910       RADIOGRAPHIC STUDIES: CT CHEST, ABDOMEN AND PELVIS WITH CONTRAST  Technique: Contiguous axial images of the chest abdomen and pelvis  were obtained after IV contrast administration.  Contrast: 100 ml Omnipaque-300  Comparison: 11/03/2012  CT CHEST  RECIST protocol 1.0  Target Lesions:  1.Right lower lobe lung "nodule" 5 mm on image 28/series 5 versus 6  mm on the prior.  2. Subcarinal lymph node 9 mm short axis on image 28/series 2  versus 8 mm on the prior.  3. Right hilar node 9 mm on image 26/series 2 versus similar on  the prior exam (when remeasured).  Non Target lesions:  1. Findings suspicious for osseous metastasis. Similar.  Findings: Lung windows demonstrate mild scarring at the right lung  base. Clear left lung.  Soft tissue windows demonstrate no supraclavicular adenopathy.  Right mastectomy and axillary nodal dissection. No axillary  adenopathy. Tortuous descending thoracic aorta. Heart size upper  normal, without  pericardial effusion. Trace right-sided pleural  thickening is unchanged. No central pulmonary embolism, on this non-  dedicated study. No mediastinal or hilar adenopathy. No internal  mammary adenopathy.  A sclerotic lesion involving the left posterior 11th rib is similar  on image 45/series 2. This is absent on 08/09/2010.  IMPRESSION:  1. Similar eleventh posterior left rib sclerosis, suspicious for  osseous metastasis.  2. No evidence of extra osseous metastasis within the chest.  CT ABDOMEN AND PELVIS  Findings: Moderate to marked hepatic steatosis. Similar vague  hyperenhancement the right lobe liver on image 52/series 2, likely  perfusion anomaly. There is sparing of steatosis adjacent the  gallbladder. Normal spleen, stomach. Duodenal diverticulum  involves the transverse segment. Normal pancreas, gallbladder,  biliary tract, right gland, kidneys. Minimal left adrenal  nodularity is unchanged. No retroperitoneal or retrocrural  adenopathy.  Normal colon, appendix, and terminal ileum. Normal small bowel  without abdominal ascites.  No evidence of omental or peritoneal disease.  No pelvic adenopathy. Normal urinary bladder and uterus. No  adnexal mass. No significant free fluid.  Advanced left hip osteoarthritis. Vague sclerosis involving the  left iliac crest is unchanged. A well-defined sclerotic lesion in  the right iliac crest is similar. Subtle sclerosis in the anterior  left sacrum on image 94 is similar.  IMPRESSION:  1. Similar subtle osseous sclerosis, for which metastatic disease  cannot be excluded.  2. No extra osseous metastasis within the abdomen/pelvis.  3. Moderate to marked hepatic steatosis.      ASSESSMENT: 62 year old female with  #1 original diagnosis of breast cancer in 2005. She was treated with a lumpectomy followed by radiation. She apparently did go on antiestrogen therapy. Thereafter she was lost to follow up due to lack of finances and loss of  insurance.    2 patient was seen by Dr. Donnie Coffin in March 2013 with metastatic recurrent breast cancer that was ER positive PR positive with lung metastasis malignant pleural effusion and malignant pericardial effusion.she underwent pericardial window as well as Pleurx catheter placement.  #3 she was then begun on BOLERO 4 clinical trial consisting of everolimus 10 mg and letrozole 2.5 mg given on a daily basis on a 28 day cycle. So far she has completed  18 cycles. She is here for cycle number 19.   #4patient had CT scans performed on 12/29/2012.the scan does not show any progressive disease and clinically patient seems to be doing quite nicely.   #5 CT scan scan on 02/23/2013, and 06/15/13 reveals stable disease.   PLAN:   #1 patient will proceed with cycle 24 of her chemotherapy consisting of everolimus and letrozole.  She is tolerating this therapy well.  I reviewed her CT scans revealing stable disease and this was communicated with the patient.  I also reviewed ehr labs with her in detail.    #2 patient will return in 4 weeks' time for followup as well CAT scans and blood work.  #3 patient is on letrozole. We discussed getting a bone density scan and this will be ordered.  All questions were answered. The patient knows to call the clinic with any problems, questions or concerns. We can certainly see the patient much sooner if necessary.  I spent 25 minutes counseling the patient face to face. The total time spent in the appointment was 30 minutes.  Drue Second, MD Medical/Oncology [facility] [phone] (beeper) [phone] (Office)  07/15/2013, 6:02 PM

## 2013-07-15 NOTE — Telephone Encounter (Signed)
appts made and printed. Pt request to due her labs early due to fasting on 08/12/13 and will come back for her appts at a later time....td

## 2013-07-20 ENCOUNTER — Telehealth: Payer: Self-pay | Admitting: Oncology

## 2013-07-20 NOTE — Telephone Encounter (Signed)
, °

## 2013-08-09 ENCOUNTER — Telehealth: Payer: Self-pay | Admitting: *Deleted

## 2013-08-09 NOTE — Telephone Encounter (Signed)
Rec'd voicemail forwarded from research nurse, Lexine Baton, that patient is sick and needs to re-schedule her appts. Called and spoke to patient, she feels she is having sinus/nasal infection. Low grade temp at 99.0 yesterday, but none today.  Instructed patient to call primary care for further treatment, she states she is ok and will be taking care of this at home with OTC meds.

## 2013-08-10 ENCOUNTER — Ambulatory Visit (HOSPITAL_COMMUNITY): Admission: RE | Admit: 2013-08-10 | Payer: Self-pay | Source: Ambulatory Visit

## 2013-08-10 ENCOUNTER — Ambulatory Visit (HOSPITAL_COMMUNITY): Payer: Self-pay

## 2013-08-11 ENCOUNTER — Ambulatory Visit (HOSPITAL_COMMUNITY)
Admission: RE | Admit: 2013-08-11 | Discharge: 2013-08-11 | Disposition: A | Discharge status: Home / Self Care | Payer: Medicare Other | Source: Ambulatory Visit | Attending: Oncology | Admitting: Oncology

## 2013-08-11 ENCOUNTER — Encounter (HOSPITAL_COMMUNITY): Payer: Self-pay

## 2013-08-11 DIAGNOSIS — K7689 Other specified diseases of liver: Secondary | ICD-10-CM | POA: Insufficient documentation

## 2013-08-11 DIAGNOSIS — Z9010 Acquired absence of unspecified breast and nipple: Secondary | ICD-10-CM | POA: Insufficient documentation

## 2013-08-11 DIAGNOSIS — M169 Osteoarthritis of hip, unspecified: Secondary | ICD-10-CM | POA: Insufficient documentation

## 2013-08-11 DIAGNOSIS — C7952 Secondary malignant neoplasm of bone marrow: Secondary | ICD-10-CM

## 2013-08-11 DIAGNOSIS — M8580 Other specified disorders of bone density and structure, unspecified site: Secondary | ICD-10-CM

## 2013-08-11 DIAGNOSIS — C50919 Malignant neoplasm of unspecified site of unspecified female breast: Secondary | ICD-10-CM | POA: Insufficient documentation

## 2013-08-11 DIAGNOSIS — J984 Other disorders of lung: Secondary | ICD-10-CM | POA: Insufficient documentation

## 2013-08-11 DIAGNOSIS — C7951 Secondary malignant neoplasm of bone: Secondary | ICD-10-CM | POA: Insufficient documentation

## 2013-08-11 DIAGNOSIS — K829 Disease of gallbladder, unspecified: Secondary | ICD-10-CM | POA: Insufficient documentation

## 2013-08-11 DIAGNOSIS — M1610 Unilateral primary osteoarthritis, unspecified hip: Secondary | ICD-10-CM | POA: Insufficient documentation

## 2013-08-11 IMAGING — CT CT ABD-PELV W/ CM
2 of 4 series · 15 of 46 positions shown, 17 images · IV contrast (OMNIPAQUE)
Comparison: 12/03/2011

CT CHEST

***ADDENDUM*** CREATED: 01/28/2012 [DATE]

Additional RECIST 1.0 measurement:
-Right hilar lymph node of 1.0 cm on image 28.  1.0 cm on the
prior.
CLINICAL DATA: Breast cancer diagnosed in 9666 and 1097.  Oral
chemotherapy in progress.  Right mastectomy.  Cough.  Shortness of
breath.
CT CHEST, ABDOMEN AND PELVIS WITH CONTRAST
TECHNIQUE: Contiguous axial images of the chest abdomen and pelvis
were obtained after IV contrast administration.
Contrast: 100  ml Lmnipaque-E44

[Series 2: cap with st · axial · 0.73mm/px · z∈[-537,+13]mm · 12 of 125 slices shown, 14 images]
[im 10/125  soft-tissue]
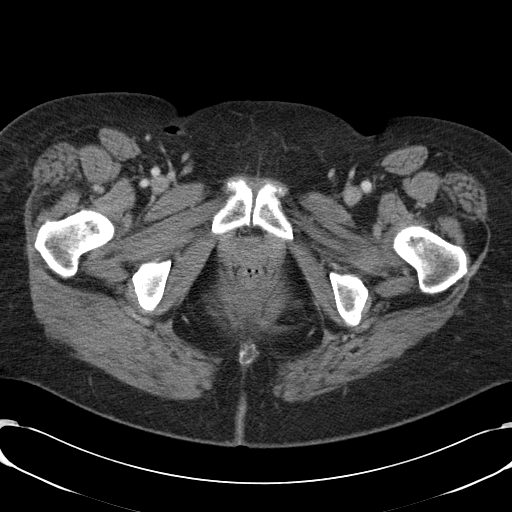
[im 10/125  bone]
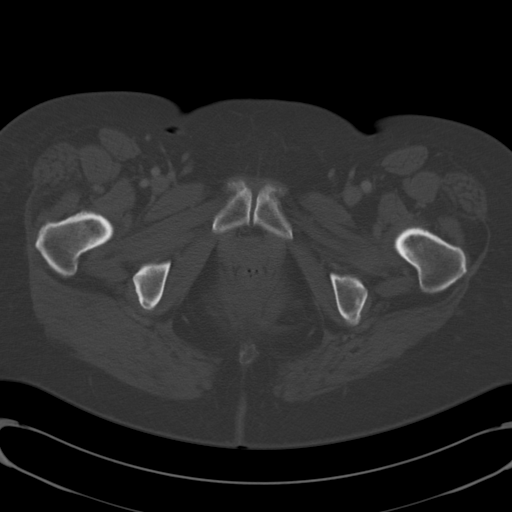
[im 20/125  soft-tissue]
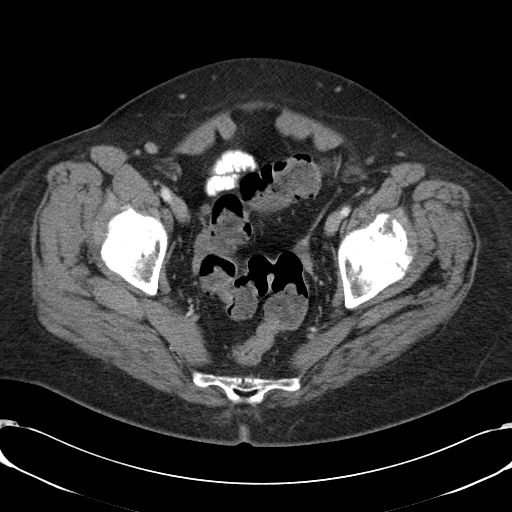
[im 30/125  soft-tissue]
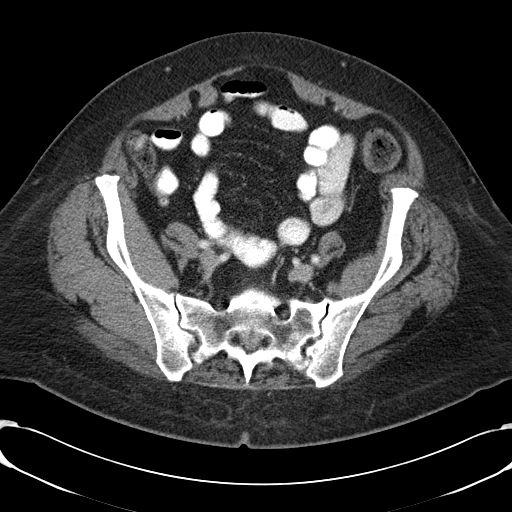
[im 40/125  soft-tissue]
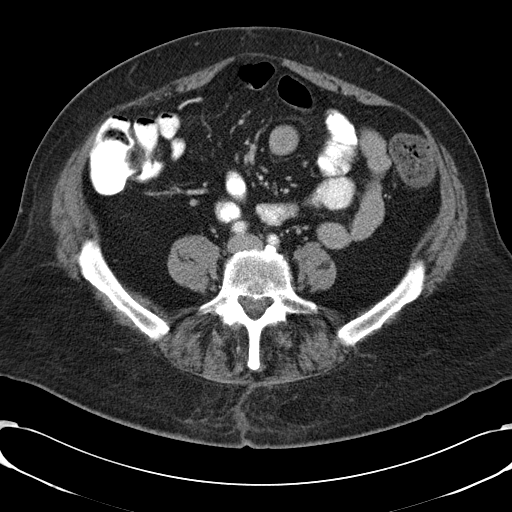
[im 50/125  soft-tissue]
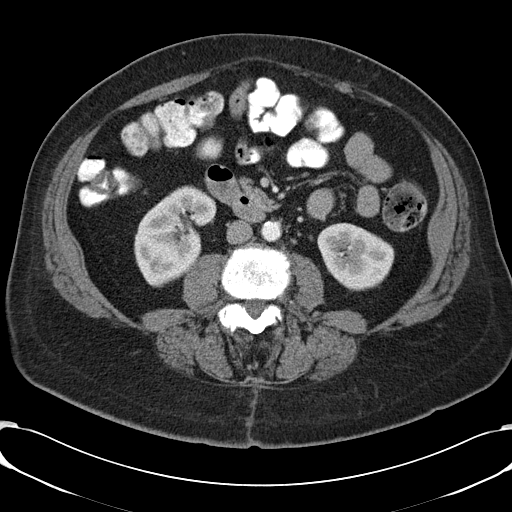
[im 60/125  soft-tissue]
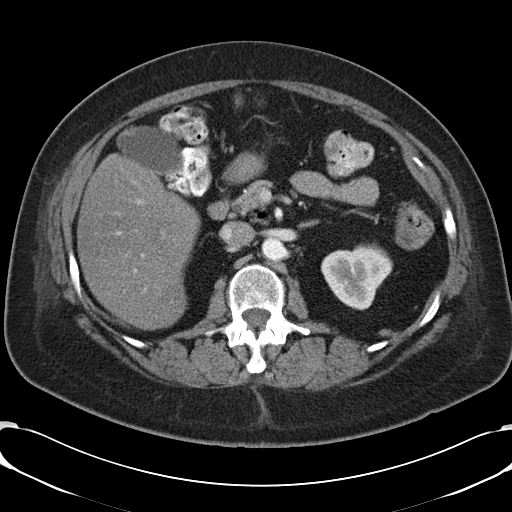
[im 70/125  soft-tissue]
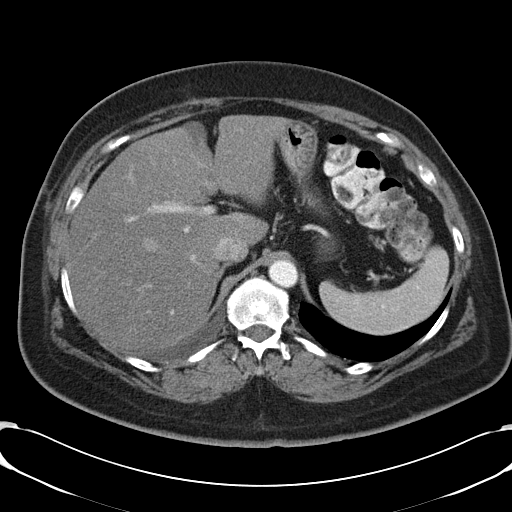
[im 80/125  soft-tissue]
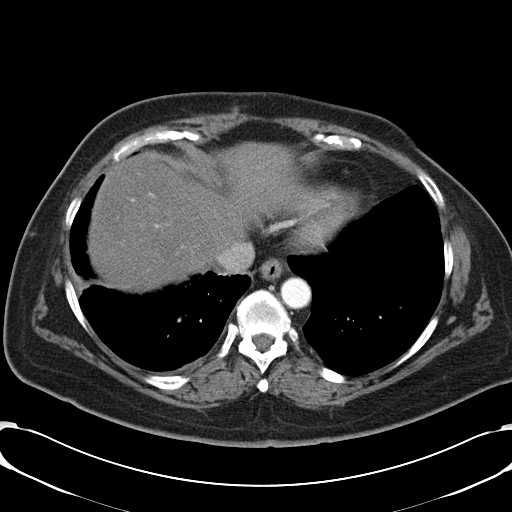
[im 90/125  soft-tissue]
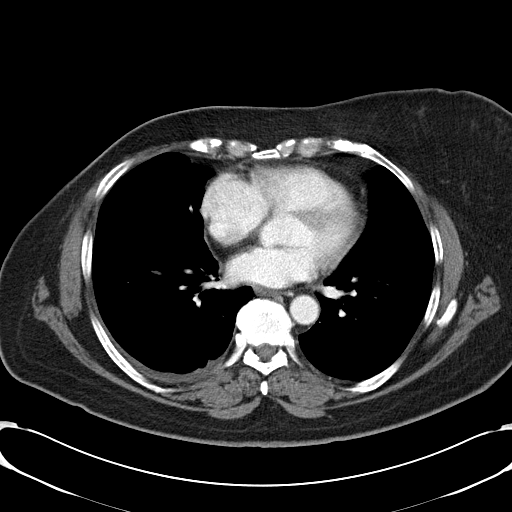
[im 90/125  bone]
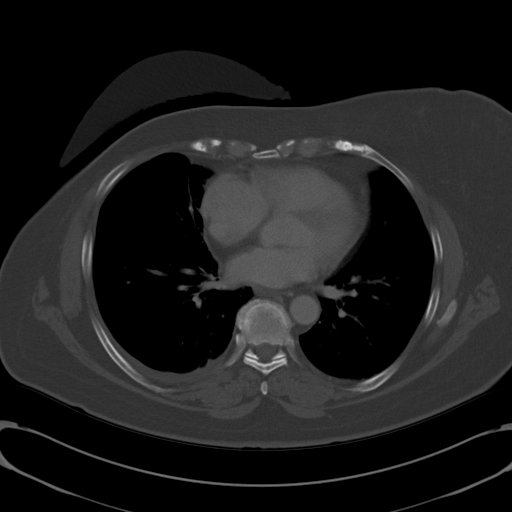
[im 100/125  soft-tissue]
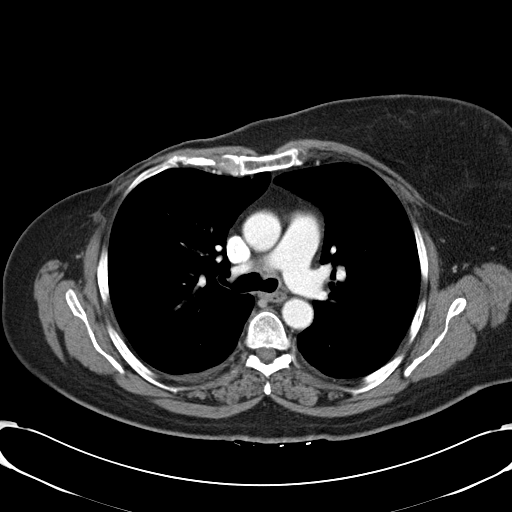
[im 110/125  soft-tissue]
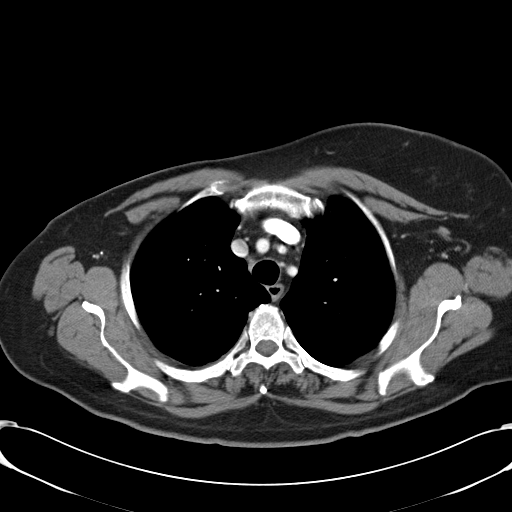
[im 120/125  soft-tissue]
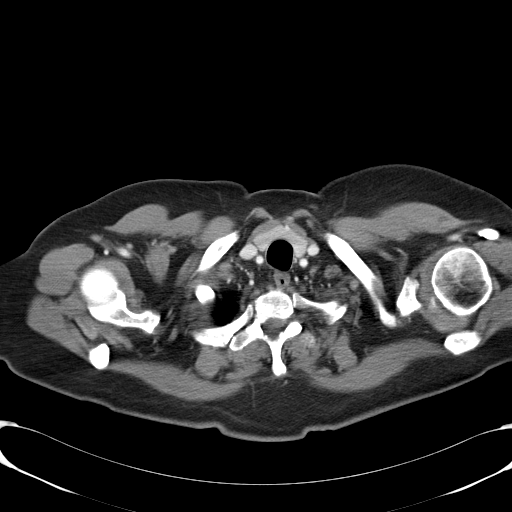

[Series 602: <mpr thick range> · coronal · 1.22mm/px · 3 of 98 slices shown]
[im 33/98  soft-tissue]
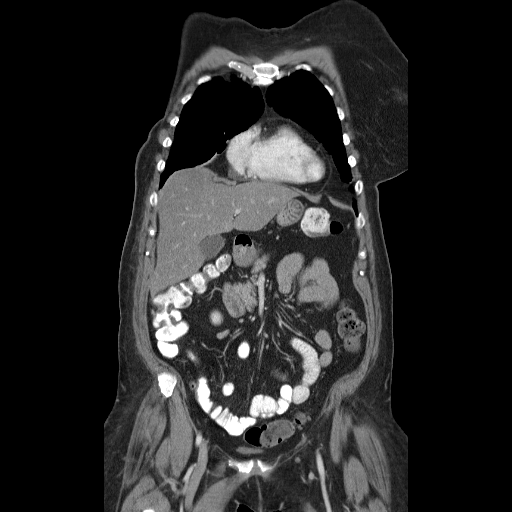
[im 44/98  soft-tissue]
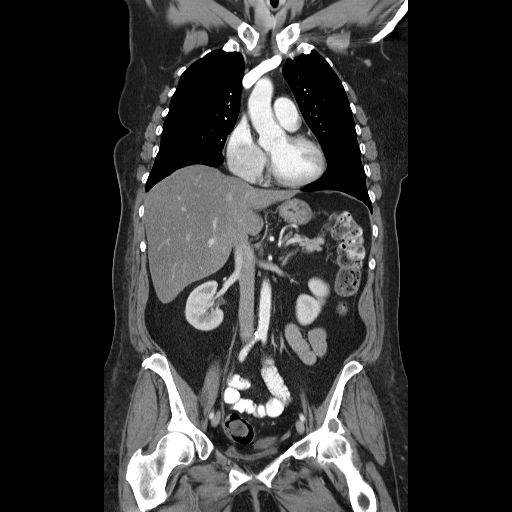
[im 54/98  soft-tissue]
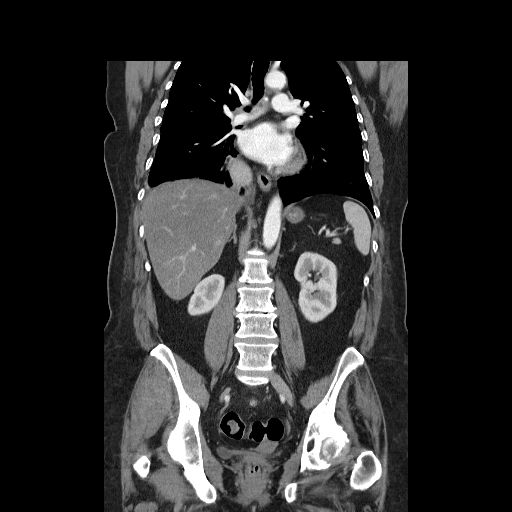

[15 of 46 positions shown; findings below may reference images not displayed]

FINDINGS: RECIST 1.0 target lesions:
- 6 mm right lower lobe lung nodule on image 39 versus 7 mm on the
prior.
- 1.1 cm subcarinal lymph node on image 30, which is unchanged.

Non measurable lesion:  Similar small right pleural effusion.

Lung windows demonstrate similar bronchial wall thickening to the
right lower lobe, mild.

Mild volume loss right lung base which is similar.

Right lower lobe lung nodule which measures 6 mm on image 39 versus
7 mm on the prior.
Perifissural nodule in superior segment right lower lobe is similar
at 4 mm on image 30.  The left lung remains clear.

Soft tissue windows demonstrate no supraclavicular adenopathy.
Right mastectomy and axillary nodal dissection.  No axillary
adenopathy.

Mild cardiomegaly.  Small right pleural effusion which is similar.
No left pleural effusion. No central pulmonary embolism, on this
non-dedicated study.  Subcarinal node which measures 1.1 cm on
image 30 and is unchanged.  No hilar adenopathy.  No internal
mammary adenopathy.   Similar sclerosis and irregularity involving
the posterior 11th left rib on image 49 of series 2.
IMPRESSION: 1.  Overall similar appearance of the chest since the prior exam.
2.  Small right-sided pulmonary nodules and upper normal-size
subcarinal node are similar.
3.  Similar small right pleural effusion.

4.  Similar sclerosis and irregularity involving the posterior left
eleventh rib.  Favor post-traumatic.

CT ABDOMEN AND PELVIS
FINDINGS: Moderate hepatic steatosis.  Suspect an area of mildly
altered perfusion in the right lobe of the liver on image 52.
Likely similar on the prior. Focal steatosis adjacent the falciform
ligament.  No suspicious liver lesion.  Normal spleen, stomach.
Transverse duodenal diverticulum.  Normal pancreas.  Minimal soft
tissue density at the gallbladder fundus which measures 7 mm on
image 65.  Possibly present on the prior.  No evidence of acute
cholecystitis.  No biliary ductal dilatation.

Normal right adrenal gland with minimal left adrenal nodularity,
unchanged.  Normal kidneys. No retroperitoneal or retrocrural
adenopathy. Normal colon, appendix, and terminal ileum.  Normal
small bowel without abdominal ascites.

  No evidence of omental or peritoneal disease.  No pelvic
adenopathy.    Normal urinary bladder and uterus.  No adnexal mass.
No significant free fluid.  Left hip osteoarthritis.

Subtle cortical based sclerotic focus is identified within the
right iliac wing.  This measures 8 mm on image 88 and is unchanged
back to 10/03/2011.  Not definitely present on 08/01/2005.

Degenerative disc disease at L3-L4
IMPRESSION: 1.  No extraosseous metastatic disease within the abdomen or
pelvis.
2.  Hepatic steatosis.  Probable area of altered perfusion in the
right lobe of the liver.  This warrants followup attention.
3.  7 mm soft tissue density in the gallbladder fundus.
Considerations include a polyp or a mobile stone.  Focal
adenomyomatosis could look similar.
4.  Subtle 8 mm focus of sclerosis within the right iliac wing.
Cannot exclude osseous metastasis. Recommend attention on follow-
up.

## 2013-08-11 MED ORDER — IOHEXOL 300 MG/ML  SOLN
50.0000 mL | Freq: Once | INTRAMUSCULAR | Status: AC | PRN
  Administered 2013-08-11: 50 mL via ORAL

## 2013-08-11 MED ORDER — IOHEXOL 300 MG/ML  SOLN
100.0000 mL | Freq: Once | INTRAMUSCULAR | Status: AC | PRN
  Administered 2013-08-11: 100 mL via INTRAVENOUS

## 2013-08-12 ENCOUNTER — Ambulatory Visit (HOSPITAL_BASED_OUTPATIENT_CLINIC_OR_DEPARTMENT_OTHER): Payer: Self-pay | Admitting: Adult Health

## 2013-08-12 ENCOUNTER — Encounter: Payer: Self-pay | Admitting: *Deleted

## 2013-08-12 ENCOUNTER — Telehealth: Payer: Self-pay | Admitting: *Deleted

## 2013-08-12 ENCOUNTER — Other Ambulatory Visit (HOSPITAL_BASED_OUTPATIENT_CLINIC_OR_DEPARTMENT_OTHER): Payer: Self-pay

## 2013-08-12 ENCOUNTER — Other Ambulatory Visit: Payer: Self-pay | Admitting: *Deleted

## 2013-08-12 ENCOUNTER — Encounter: Payer: Self-pay | Admitting: Adult Health

## 2013-08-12 VITALS — BP 115/79 | HR 96 | Temp 97.8°F | Resp 18 | Ht 65.0 in | Wt 185.0 lb

## 2013-08-12 DIAGNOSIS — R5383 Other fatigue: Secondary | ICD-10-CM

## 2013-08-12 DIAGNOSIS — C7951 Secondary malignant neoplasm of bone: Secondary | ICD-10-CM

## 2013-08-12 DIAGNOSIS — C7952 Secondary malignant neoplasm of bone marrow: Secondary | ICD-10-CM

## 2013-08-12 DIAGNOSIS — C50919 Malignant neoplasm of unspecified site of unspecified female breast: Secondary | ICD-10-CM

## 2013-08-12 DIAGNOSIS — J910 Malignant pleural effusion: Secondary | ICD-10-CM

## 2013-08-12 DIAGNOSIS — R5381 Other malaise: Secondary | ICD-10-CM

## 2013-08-12 DIAGNOSIS — M899 Disorder of bone, unspecified: Secondary | ICD-10-CM

## 2013-08-12 DIAGNOSIS — M949 Disorder of cartilage, unspecified: Secondary | ICD-10-CM

## 2013-08-12 DIAGNOSIS — C7800 Secondary malignant neoplasm of unspecified lung: Secondary | ICD-10-CM

## 2013-08-12 LAB — CBC WITH DIFFERENTIAL/PLATELET
BASO%: 0.9 % (ref 0.0–2.0)
Basophils Absolute: 0.1 10*3/uL (ref 0.0–0.1)
EOS ABS: 0.2 10*3/uL (ref 0.0–0.5)
EOS%: 1.7 % (ref 0.0–7.0)
HEMATOCRIT: 40.5 % (ref 34.8–46.6)
HGB: 13.5 g/dL (ref 11.6–15.9)
LYMPH#: 1.1 10*3/uL (ref 0.9–3.3)
LYMPH%: 11.5 % — AB (ref 14.0–49.7)
MCH: 25.9 pg (ref 25.1–34.0)
MCHC: 33.4 g/dL (ref 31.5–36.0)
MCV: 77.8 fL — ABNORMAL LOW (ref 79.5–101.0)
MONO#: 0.8 10*3/uL (ref 0.1–0.9)
MONO%: 8.7 % (ref 0.0–14.0)
NEUT%: 77.2 % — AB (ref 38.4–76.8)
NEUTROS ABS: 7.3 10*3/uL — AB (ref 1.5–6.5)
PLATELETS: 329 10*3/uL (ref 145–400)
RBC: 5.21 10*6/uL (ref 3.70–5.45)
RDW: 13.9 % (ref 11.2–14.5)
WBC: 9.5 10*3/uL (ref 3.9–10.3)

## 2013-08-12 LAB — COMPREHENSIVE METABOLIC PANEL (CC13)
ALT: 32 U/L (ref 0–55)
ANION GAP: 11 meq/L (ref 3–11)
AST: 26 U/L (ref 5–34)
Albumin: 3.3 g/dL — ABNORMAL LOW (ref 3.5–5.0)
Alkaline Phosphatase: 218 U/L — ABNORMAL HIGH (ref 40–150)
BILIRUBIN TOTAL: 0.26 mg/dL (ref 0.20–1.20)
BUN: 13.3 mg/dL (ref 7.0–26.0)
CALCIUM: 10 mg/dL (ref 8.4–10.4)
CHLORIDE: 107 meq/L (ref 98–109)
CO2: 22 meq/L (ref 22–29)
Creatinine: 0.9 mg/dL (ref 0.6–1.1)
GLUCOSE: 146 mg/dL — AB (ref 70–140)
Potassium: 4.2 mEq/L (ref 3.5–5.1)
Sodium: 140 mEq/L (ref 136–145)
Total Protein: 8 g/dL (ref 6.4–8.3)

## 2013-08-12 LAB — URIC ACID (CC13): Uric Acid, Serum: 3.3 mg/dl (ref 2.6–7.4)

## 2013-08-12 LAB — LIPID PANEL
Cholesterol: 210 mg/dL — ABNORMAL HIGH (ref 0–200)
HDL: 47 mg/dL (ref >39)
LDL Cholesterol: 101 mg/dL — ABNORMAL HIGH (ref 0–99)
Total CHOL/HDL Ratio: 4.5 Ratio
Triglycerides: 311 mg/dL — ABNORMAL HIGH (ref <150)
VLDL: 62 mg/dL — ABNORMAL HIGH (ref 0–40)

## 2013-08-12 LAB — BILIRUBIN, DIRECT

## 2013-08-12 LAB — CK: CK TOTAL: 67 U/L (ref 7–177)

## 2013-08-12 LAB — PHOSPHORUS: PHOSPHORUS: 3 mg/dL (ref 2.3–4.6)

## 2013-08-12 MED ORDER — INV-EVEROLIMUS (RAD0001) 5MG TABLET NOVARTIS CRAD001Y24135: 2.0000 | ORAL_TABLET | Freq: Every day | ORAL | Status: DC

## 2013-08-12 NOTE — Progress Notes (Signed)
08/12/13 at 3:00pm - [employer], Bolero 4 - cycle 25, day 1 study notes.  The pt was into the cancer center this morning for her fasting labs and her cycle 25 assessments.  The pt confirmed that she had been fasting overnight and this am.  She returned her cycle 24 everolimus drug box.  The pt stated that she took her everolimus and her letrozole daily as prescribed from 07/15/13 through 08/11/13 ( 28 days).  The pt's drug box revealed 56 taken pills and 14 remaining pills.  The pt was dispensed 70 - 56 taken= 14 remaining pills.  Therefore, the pt was 100% compliant with her study drug.  The research nurse took the everolimus box to the pharmacy for the drug accountability check.  The pt was seen and examined today by [name] Alar, NP.  The pt denies any new adverse events.  She reports developing a viral infection/flu-like symptoms (grade 2) on 08/06/13.  She started taking OTC Robitusson took 10 ml (BID prn) and Dayquil cold and flu medicine 1 caplet (TID prn) on 08/06/13.  The pt's concomitant medications were reviewed with the patient.  The OTC Robitussin and Dayquil were her only new medications.  She reports a great appetite.  She reports the following AE's as ongoing:  hot flashes, memory impairment, nausea, diarrhea, arthritis in hands, bilateral cataracts, fatigue, and knee pain. The pt's most recent CT scans were discussed with the pt.  The pt was informed that her scans do not reveal any progressive disease.  The pt is maintaining her "partial response".  The pt's labs were reviewed by the NP,  and the pt's lab abnormals were considered "not clinically significant".   The pt is able to perform most of usual activities.  She is limited in strenuous activity,  ECOG =1.  Dr. Humphrey Rolls reviewed the pt's scans and stated that she wanted the pt to continue on her current treatment.  The pt was dispensed her new everolimus drug box for cycle 25.  The pt is aware of her cycle 26 appointments with Dr. Humphrey Rolls.

## 2013-08-12 NOTE — Progress Notes (Signed)
OFFICE PROGRESS NOTE  CC  Barbara Smolder, MD [address] [place] [address]  DIAGNOSIS: 63 year old female with metastatic breast cancer with bone metastasis and lung metastasis.  PRIOR THERAPY:  #1 patient originally presented when she was 63 years old with early stage breast cancer in 2005. At that time she underwent a lumpectomy with axillary lymph node dissection. She was followed by Dr. Eston Esters and Collier Salina young. Her last visit to Dr. Julien Girt office was in 2006. Because of difficulties with insurance she had not been receiving mammograms.  #2 in 2012 patient began having shortness of breath on exertion. She was seen by pulmonary service and a CT scan showed some minor mediastinal adenopathy as well as borderline lower lobe nodule. PET scan was not performed. October 2012 patient began to experience more shortness of breath. Initially she was told that she had pneumonia. However subsequently she developed lower extremity edema. CT of the chest was obtained that showed a large pleural effusion in the right side as well as pericardial effusion. She was seen by cardiothoracic surgery placed in 09/11/2011. The cytology revealed that patient indeed have recurrent breast cancer that was hormone receptor positive HER-2/neu was not obtained as it was insufficient tissue. She subsequently had a CT scan performed that showed 2 nodules in the lung suspicious for metastatic disease as well as left lower lobe collapse. She had a biopsy of the lungs performed on 09/11/2011 the tumor was estrogen receptor +91% progesterone receptor +100% proliferation marker Ki-67 35%.  #3 patient was seen by Dr. Eston Esters and he recommended that she be enrolled on the BOLERO-4 clinical trial utilizing letrozole 2.5 mg per day and affinitor10 mg per day. She initiated this on 10/10/2011.she has had a significant response to this lower extremity edema has reduced significantly in size she is no  longer short of breath.  CURRENT THERAPY:BOLERO -4 with everolimus 10 mg and letrozole 2.5 mg daily. Patient will begin cycle 25 day 1 today. ( 28 day cycles)  INTERVAL HISTORY: Barbara Parks 63 y.o. female returns for followup visit prior to cycle 25.  She is doing well today. She has had a recent upper respiratory infection that is resolving, and it was worst on Thursday through Monday.  She is doing better today.  She is fatigued.  She does have some joint aches, hot flashes at night, that are toelrable.  She denies hot flashes, skin changes, headaches, new pain, or any further concerns.  Otherwise, a 10 point ROS is neg.   MEDICAL HISTORY: Past Medical History  Diagnosis Date  . Asthma   . Shortness of breath   . Headache(784.0)     OTC Prilosec  . Arthritis     left hip  . Contact dermatitis     from a bandaid that had Latex   . breast ca 2005    breast/chemo R mastectomy  . Metastasis to lung dx'd 08/2011    ALLERGIES:  is allergic to aspirin; latex; and nsaids.  MEDICATIONS:  Current Outpatient Prescriptions  Medication Sig Dispense Refill  . cholecalciferol (VITAMIN D) 1000 UNITS tablet Take 2,000 Units by mouth daily.       Marland Kitchen DM-Phenylephrine-Acetaminophen (VICKS DAYQUIL COLD & FLU PO) Take by mouth. Started on Aug 06, 2013. Taking as directed      . gemfibrozil (LOPID) 600 MG tablet Take 1 tablet (600 mg total) by mouth 2 (two) times daily before a meal.  60 tablet  1  .  ibuprofen (ADVIL,MOTRIN) 200 MG tablet Take 400 mg by mouth every 6 (six) hours as needed. For pain      . Investigational everolimus (RAD001) 5 MG tablet Novartis [id number] Take 2 tablets by mouth daily. Take with a glass of water.  70 tablet  0  . letrozole (FEMARA) 2.5 MG tablet Take 1 tablet (2.5 mg total) by mouth daily.  90 tablet  6  . loperamide (IMODIUM) 2 MG capsule Take 2 mg by mouth 4 (four) times daily as needed for diarrhea or loose stools.      . metoprolol tartrate (LOPRESSOR) 25 MG  tablet Take 0.5 tablets (12.5 mg total) by mouth daily.  30 tablet  4  . prochlorperazine (COMPAZINE) 10 MG tablet Take 10 mg by mouth every 6 (six) hours as needed. For nausea       No current facility-administered medications for this visit.    SURGICAL HISTORY:  Past Surgical History  Procedure Laterality Date  . Spine surgery  1996  . Breast surgery  2005    right  . Video bronchoscopy  09/11/2011    Procedure: VIDEO BRONCHOSCOPY;  Surgeon: Gaye Pollack, MD;  Location: [facility];  Service: Thoracic;  Laterality: N/A;  . Chest tube insertion  09/11/2011    Procedure: INSERTION PLEURAL DRAINAGE CATHETER;  Surgeon: Gaye Pollack, MD;  Location: [facility];  Service: Thoracic;  Laterality: Right;  . Pericardial window  09/11/2011    Procedure: PERICARDIAL WINDOW;  Surgeon: Gaye Pollack, MD;  Location: [facility];  Service: Thoracic;  Laterality: N/A;  . Removal of pleural drainage catheter  12/19/2011    Procedure: REMOVAL OF PLEURAL DRAINAGE CATHETER;  Surgeon: Gaye Pollack, MD;  Location: [facility];  Service: Thoracic;  Laterality: Right;  TO BE DONE IN MINOR ROOM, SHORT STAY    REVIEW OF SYSTEMS:  A 10 point review of systems was conducted and is otherwise negative except for what is noted above.    PHYSICAL EXAMINATION: Blood pressure 115/79, pulse 96, temperature 97.8 F (36.6 C), temperature source Oral, resp. rate 18, height 5' 5"  (1.651 m), weight 185 lb (83.915 kg). Body mass index is 30.79 kg/(m^2). GENERAL: Patient is a well appearing female in no acute distress HEENT:  Sclerae anicteric.  Oropharynx clear and moist. No ulcerations or evidence of oropharyngeal candidiasis. Neck is supple.  NODES:  No cervical, supraclavicular, or axillary lymphadenopathy palpated.  BREAST EXAM:  Deferred. LUNGS:  Clear to auscultation bilaterally.  No wheezes or rhonchi. HEART:  Regular rate and rhythm. No murmur appreciated. ABDOMEN:  Soft, nontender.  Positive, normoactive bowel sounds. No organomegaly  palpated. MSK:  No focal spinal tenderness to palpation. Full range of motion bilaterally in the upper extremities. EXTREMITIES:  No peripheral edema.   SKIN:  Clear with no obvious rashes or skin changes. No nail dyscrasia. NEURO:  Nonfocal. Well oriented.  Appropriate affect. ECOG PERFORMANCE STATUS: 1 - Symptomatic but completely ambulatory    LABORATORY DATA: Lab Results  Component Value Date   WBC 9.5 08/12/2013   HGB 13.5 08/12/2013   HCT 40.5 08/12/2013   MCV 77.8* 08/12/2013   PLT 329 08/12/2013      Chemistry      Component Value Date/Time   NA 140 08/12/2013 1057   NA 136 07/16/2012 0910   K 4.2 08/12/2013 1057   K 4.0 07/16/2012 0910   CL 109* 12/31/2012 0940   CL 103 07/16/2012 0910   CO2 22 08/12/2013 1057  CO2 21 07/16/2012 0910   BUN 13.3 08/12/2013 1057   BUN 16 07/16/2012 0910   CREATININE 0.9 08/12/2013 1057   CREATININE 0.75 07/16/2012 0910      Component Value Date/Time   CALCIUM 10.0 08/12/2013 1057   CALCIUM 10.1 07/16/2012 0910   ALKPHOS 218* 08/12/2013 1057   ALKPHOS 142* 07/16/2012 0910   AST 26 08/12/2013 1057   AST 30 07/16/2012 0910   ALT 32 08/12/2013 1057   ALT 33 07/16/2012 0910   BILITOT 0.26 08/12/2013 1057   BILITOT 0.2* 07/16/2012 0910       RADIOGRAPHIC STUDIES: CT CHEST, ABDOMEN AND PELVIS WITH CONTRAST  Technique: Contiguous axial images of the chest abdomen and pelvis  were obtained after IV contrast administration.  Contrast: 100 ml Omnipaque-300  Comparison: 11/03/2012  CT CHEST  RECIST protocol 1.0  Target Lesions:  1.Right lower lobe lung "nodule" 5 mm on image 28/series 5 versus 6  mm on the prior.  2. Subcarinal lymph node 9 mm short axis on image 28/series 2  versus 8 mm on the prior.  3. Right hilar node 9 mm on image 26/series 2 versus similar on  the prior exam (when remeasured).  Non Target lesions:  1. Findings suspicious for osseous metastasis. Similar.  Findings: Lung windows demonstrate mild scarring at the right  lung  base. Clear left lung.  Soft tissue windows demonstrate no supraclavicular adenopathy.  Right mastectomy and axillary nodal dissection. No axillary  adenopathy. Tortuous descending thoracic aorta. Heart size upper  normal, without pericardial effusion. Trace right-sided pleural  thickening is unchanged. No central pulmonary embolism, on this non-  dedicated study. No mediastinal or hilar adenopathy. No internal  mammary adenopathy.  A sclerotic lesion involving the left posterior 11th rib is similar  on image 45/series 2. This is absent on 08/09/2010.  IMPRESSION:  1. Similar eleventh posterior left rib sclerosis, suspicious for  osseous metastasis.  2. No evidence of extra osseous metastasis within the chest.  CT ABDOMEN AND PELVIS  Findings: Moderate to marked hepatic steatosis. Similar vague  hyperenhancement the right lobe liver on image 52/series 2, likely  perfusion anomaly. There is sparing of steatosis adjacent the  gallbladder. Normal spleen, stomach. Duodenal diverticulum  involves the transverse segment. Normal pancreas, gallbladder,  biliary tract, right gland, kidneys. Minimal left adrenal  nodularity is unchanged. No retroperitoneal or retrocrural  adenopathy.  Normal colon, appendix, and terminal ileum. Normal small bowel  without abdominal ascites.  No evidence of omental or peritoneal disease.  No pelvic adenopathy. Normal urinary bladder and uterus. No  adnexal mass. No significant free fluid.  Advanced left hip osteoarthritis. Vague sclerosis involving the  left iliac crest is unchanged. A well-defined sclerotic lesion in  the right iliac crest is similar. Subtle sclerosis in the anterior  left sacrum on image 94 is similar.  IMPRESSION:  1. Similar subtle osseous sclerosis, for which metastatic disease  cannot be excluded.  2. No extra osseous metastasis within the abdomen/pelvis.  3. Moderate to marked hepatic steatosis.   ASSESSMENT: 63 year old  female with  #1 original diagnosis of breast cancer in 2005. She was treated with a lumpectomy followed by radiation. She apparently did go on antiestrogen therapy. Thereafter she was lost to follow up due to lack of finances and loss of insurance.    2 patient was seen by Dr. Truddie Coco in March 2013 with metastatic recurrent breast cancer that was ER positive PR positive with lung metastasis malignant pleural effusion and  malignant pericardial effusion.  She underwent pericardial window as well as Pleurx catheter placement.  #3 she was then begun on BOLERO 4 clinical trial consisting of everolimus 10 mg and letrozole 2.5 mg given on a daily basis on a 28 day cycle. So far she has completed 24 cycles. She is here for cycle number 25.  #4patient had CT scans performed on 08/11/13, one lung  Nodule was stable, the other ones were decreased.  PLAN:   #1 Patient is doing well today.  I reviewed her labs with her.  She will proceed with cycle 25 of the BOLERO 4 clinical trial consisting of Everolimus and Letrozole daily.    #2 I reviewed her CT scans with her in detail.  Her disease has shrank even more.  She was relieved and excited with this news.    #3  I reviewed her bone density results with her.  She will take Calcium, Vitamin D and perform weight bearing exercises for mild osteopenia in one hip.    #4 She will return in one month for labs and evaluation.    All questions were answered. The patient knows to call the clinic with any problems, questions or concerns. We can certainly see the patient much sooner if necessary.  I spent 25 minutes counseling the patient face to face. The total time spent in the appointment was 30 minutes.  Minette Headland, [place] [phone] 08/12/2013, 1:55 PM

## 2013-08-12 NOTE — Telephone Encounter (Signed)
appts made and printed...td 

## 2013-08-17 ENCOUNTER — Other Ambulatory Visit: Payer: Self-pay

## 2013-09-03 ENCOUNTER — Other Ambulatory Visit: Payer: Self-pay | Admitting: *Deleted

## 2013-09-03 DIAGNOSIS — C50919 Malignant neoplasm of unspecified site of unspecified female breast: Secondary | ICD-10-CM

## 2013-09-09 ENCOUNTER — Other Ambulatory Visit: Payer: Self-pay

## 2013-09-09 ENCOUNTER — Other Ambulatory Visit (HOSPITAL_BASED_OUTPATIENT_CLINIC_OR_DEPARTMENT_OTHER): Payer: Self-pay

## 2013-09-09 ENCOUNTER — Encounter: Payer: Self-pay | Admitting: *Deleted

## 2013-09-09 ENCOUNTER — Ambulatory Visit (HOSPITAL_BASED_OUTPATIENT_CLINIC_OR_DEPARTMENT_OTHER): Payer: Self-pay | Admitting: Oncology

## 2013-09-09 ENCOUNTER — Encounter: Payer: Self-pay | Admitting: Oncology

## 2013-09-09 ENCOUNTER — Ambulatory Visit: Payer: Self-pay | Admitting: Adult Health

## 2013-09-09 VITALS — BP 120/85 | HR 98 | Temp 97.7°F | Resp 20 | Ht 65.0 in | Wt 184.7 lb

## 2013-09-09 DIAGNOSIS — C7951 Secondary malignant neoplasm of bone: Secondary | ICD-10-CM

## 2013-09-09 DIAGNOSIS — C7800 Secondary malignant neoplasm of unspecified lung: Secondary | ICD-10-CM

## 2013-09-09 DIAGNOSIS — C50919 Malignant neoplasm of unspecified site of unspecified female breast: Secondary | ICD-10-CM

## 2013-09-09 DIAGNOSIS — Z170 Estrogen receptor positive status [ER+]: Secondary | ICD-10-CM

## 2013-09-09 DIAGNOSIS — C7952 Secondary malignant neoplasm of bone marrow: Secondary | ICD-10-CM

## 2013-09-09 DIAGNOSIS — M949 Disorder of cartilage, unspecified: Secondary | ICD-10-CM

## 2013-09-09 DIAGNOSIS — M899 Disorder of bone, unspecified: Secondary | ICD-10-CM

## 2013-09-09 DIAGNOSIS — M8580 Other specified disorders of bone density and structure, unspecified site: Secondary | ICD-10-CM

## 2013-09-09 DIAGNOSIS — C50419 Malignant neoplasm of upper-outer quadrant of unspecified female breast: Secondary | ICD-10-CM

## 2013-09-09 HISTORY — DX: Other specified disorders of bone density and structure, unspecified site: M85.80

## 2013-09-09 LAB — LIPID PANEL
Cholesterol: 230 mg/dL — ABNORMAL HIGH (ref 0–200)
HDL: 49 mg/dL (ref >39)
LDL Cholesterol: 134 mg/dL — ABNORMAL HIGH (ref 0–99)
Total CHOL/HDL Ratio: 4.7 Ratio
Triglycerides: 234 mg/dL — ABNORMAL HIGH (ref <150)
VLDL: 47 mg/dL — ABNORMAL HIGH (ref 0–40)

## 2013-09-09 LAB — CBC WITH DIFFERENTIAL/PLATELET
BASO%: 0.6 % (ref 0.0–2.0)
BASOS ABS: 0 10*3/uL (ref 0.0–0.1)
EOS%: 1.8 % (ref 0.0–7.0)
Eosinophils Absolute: 0.1 10*3/uL (ref 0.0–0.5)
HEMATOCRIT: 42.5 % (ref 34.8–46.6)
HEMOGLOBIN: 13.9 g/dL (ref 11.6–15.9)
LYMPH#: 0.8 10*3/uL — AB (ref 0.9–3.3)
LYMPH%: 11.8 % — ABNORMAL LOW (ref 14.0–49.7)
MCH: 25.9 pg (ref 25.1–34.0)
MCHC: 32.7 g/dL (ref 31.5–36.0)
MCV: 79.1 fL — ABNORMAL LOW (ref 79.5–101.0)
MONO#: 0.8 10*3/uL (ref 0.1–0.9)
MONO%: 11.2 % (ref 0.0–14.0)
NEUT#: 5 10*3/uL (ref 1.5–6.5)
NEUT%: 74.6 % (ref 38.4–76.8)
Platelets: 288 10*3/uL (ref 145–400)
RBC: 5.37 10*6/uL (ref 3.70–5.45)
RDW: 14.4 % (ref 11.2–14.5)
WBC: 6.7 10*3/uL (ref 3.9–10.3)
nRBC: 0 % (ref 0–0)

## 2013-09-09 LAB — CK: Total CK: 83 U/L (ref 7–177)

## 2013-09-09 LAB — COMPREHENSIVE METABOLIC PANEL (CC13)
ALBUMIN: 3.7 g/dL (ref 3.5–5.0)
ALK PHOS: 194 U/L — AB (ref 40–150)
ALT: 34 U/L (ref 0–55)
AST: 33 U/L (ref 5–34)
Anion Gap: 11 mEq/L (ref 3–11)
BUN: 17.9 mg/dL (ref 7.0–26.0)
CALCIUM: 10.3 mg/dL (ref 8.4–10.4)
CHLORIDE: 108 meq/L (ref 98–109)
CO2: 20 meq/L — AB (ref 22–29)
Creatinine: 0.9 mg/dL (ref 0.6–1.1)
GLUCOSE: 148 mg/dL — AB (ref 70–140)
POTASSIUM: 3.9 meq/L (ref 3.5–5.1)
Sodium: 140 mEq/L (ref 136–145)
Total Bilirubin: 0.29 mg/dL (ref 0.20–1.20)
Total Protein: 8 g/dL (ref 6.4–8.3)

## 2013-09-09 LAB — URIC ACID (CC13): URIC ACID, SERUM: 3.6 mg/dL (ref 2.6–7.4)

## 2013-09-09 LAB — PHOSPHORUS: PHOSPHORUS: 3.2 mg/dL (ref 2.3–4.6)

## 2013-09-09 LAB — BILIRUBIN, DIRECT

## 2013-09-09 MED ORDER — INV-EVEROLIMUS (RAD0001) 5MG TABLET NOVARTIS CRAD001Y24135: 2.0000 | ORAL_TABLET | Freq: Every day | ORAL | Status: DC

## 2013-09-09 NOTE — Progress Notes (Signed)
OFFICE PROGRESS NOTE  CC  Barbara Smolder, MD [address] [place] [address]  DIAGNOSIS: 63 year old female with metastatic breast cancer with bone metastasis and lung metastasis.  PRIOR THERAPY:  #1 patient originally presented when she was 63 years old with early stage breast cancer in 2005. At that time she underwent a lumpectomy with axillary lymph node dissection. She was followed by Dr. Eston Esters and Collier Salina young. Her last visit to Dr. Julien Girt office was in 2006. Because of difficulties with insurance she had not been receiving mammograms.  #2 in 2012 patient began having shortness of breath on exertion. She was seen by pulmonary service and a CT scan showed some minor mediastinal adenopathy as well as borderline lower lobe nodule. PET scan was not performed. October 2012 patient began to experience more shortness of breath. Initially she was told that she had pneumonia. However subsequently she developed lower extremity edema. CT of the chest was obtained that showed a large pleural effusion in the right side as well as pericardial effusion. She was seen by cardiothoracic surgery placed in 09/11/2011. The cytology revealed that patient indeed have recurrent breast cancer that was hormone receptor positive HER-2/neu was not obtained as it was insufficient tissue. She subsequently had a CT scan performed that showed 2 nodules in the lung suspicious for metastatic disease as well as left lower lobe collapse. She had a biopsy of the lungs performed on 09/11/2011 the tumor was estrogen receptor +91% progesterone receptor +100% proliferation marker Ki-67 35%.  #3 patient was seen by Dr. Eston Esters and he recommended that she be enrolled on the BOLERO-4 clinical trial utilizing letrozole 2.5 mg per day and affinitor10 mg per day. She initiated this on 10/10/2011.she has had a significant response to this lower extremity edema has reduced significantly in size she is no  longer short of breath.  CURRENT THERAPY:BOLERO -4 with everolimus 10 mg and letrozole 2.5 mg daily. Patient will begin cycle 26 day 1 today. ( 28 day cycles)  INTERVAL HISTORY: Barbara Parks 63 y.o. female returns for followup visit prior to cycle 26.  She is doing well today.   She is doing better today.  She is fatigued.  She does have some joint aches, hot flashes at night, that are toelrable.  She denies hot flashes, skin changes, headaches, new pain, or any further concerns. I reviewed patient's bone density scan with her. She does have some osteopenia. Her CT scan also did reveal sclerotic lesions in the ribs as well as the thorax. Consistent with healing metastasis. I do think she would be a good candidate for either xgeva or zometa. However she is gong to have some dental work done and I have recommended to hold off on these agents until after. This is primarily tdue to  possibility of developing 01J.  Otherwise, a 10 point ROS is neg.   MEDICAL HISTORY: Past Medical History  Diagnosis Date  . Asthma   . Shortness of breath   . Headache(784.0)     OTC Prilosec  . Arthritis     left hip  . Contact dermatitis     from a bandaid that had Latex   . breast ca 2005    breast/chemo R mastectomy  . Metastasis to lung dx'd 08/2011    ALLERGIES:  is allergic to aspirin; latex; and nsaids.  MEDICATIONS:  Current Outpatient Prescriptions  Medication Sig Dispense Refill  . cholecalciferol (VITAMIN D) 1000 UNITS tablet Take 2,000  Units by mouth daily.       Marland Kitchen gemfibrozil (LOPID) 600 MG tablet Take 1 tablet (600 mg total) by mouth 2 (two) times daily before a meal.  60 tablet  1  . ibuprofen (ADVIL,MOTRIN) 200 MG tablet Take 400 mg by mouth every 6 (six) hours as needed. For pain      . Investigational everolimus (RAD001) 5 MG tablet Novartis [id number] Take 2 tablets by mouth daily. Take with a glass of water.  70 tablet  0  . Investigational everolimus (RAD001) 5 MG tablet Novartis  [id number] Take 2 tablets by mouth daily. Take with a glass of water.  70 tablet  0  . letrozole (FEMARA) 2.5 MG tablet Take 1 tablet (2.5 mg total) by mouth daily.  90 tablet  6  . loperamide (IMODIUM) 2 MG capsule Take 2 mg by mouth 4 (four) times daily as needed for diarrhea or loose stools.      . metoprolol tartrate (LOPRESSOR) 25 MG tablet Take 0.5 tablets (12.5 mg total) by mouth daily.  30 tablet  4  . prochlorperazine (COMPAZINE) 10 MG tablet Take 10 mg by mouth every 6 (six) hours as needed. For nausea      . DM-Phenylephrine-Acetaminophen (VICKS DAYQUIL COLD & FLU PO) Take by mouth. Started on Aug 06, 2013. Taking as directed       No current facility-administered medications for this visit.    SURGICAL HISTORY:  Past Surgical History  Procedure Laterality Date  . Spine surgery  1996  . Breast surgery  2005    right  . Video bronchoscopy  09/11/2011    Procedure: VIDEO BRONCHOSCOPY;  Surgeon: Gaye Pollack, MD;  Location: [facility];  Service: Thoracic;  Laterality: N/A;  . Chest tube insertion  09/11/2011    Procedure: INSERTION PLEURAL DRAINAGE CATHETER;  Surgeon: Gaye Pollack, MD;  Location: [facility];  Service: Thoracic;  Laterality: Right;  . Pericardial window  09/11/2011    Procedure: PERICARDIAL WINDOW;  Surgeon: Gaye Pollack, MD;  Location: [facility];  Service: Thoracic;  Laterality: N/A;  . Removal of pleural drainage catheter  12/19/2011    Procedure: REMOVAL OF PLEURAL DRAINAGE CATHETER;  Surgeon: Gaye Pollack, MD;  Location: [facility];  Service: Thoracic;  Laterality: Right;  TO BE DONE IN MINOR ROOM, SHORT STAY    REVIEW OF SYSTEMS:  A 10 point review of systems was conducted and is otherwise negative except for what is noted above.    PHYSICAL EXAMINATION: Blood pressure 120/85, pulse 98, temperature 97.7 F (36.5 C), temperature source Oral, resp. rate 20, height 5' 5"  (1.651 m), weight 184 lb 11.2 oz (83.779 kg). Body mass index is 30.74 kg/(m^2). GENERAL: Patient is a  well appearing female in no acute distress HEENT:  Sclerae anicteric.  Oropharynx clear and moist. No ulcerations or evidence of oropharyngeal candidiasis. Neck is supple.  NODES:  No cervical, supraclavicular, or axillary lymphadenopathy palpated.  BREAST EXAM:  Deferred. LUNGS:  Clear to auscultation bilaterally.  No wheezes or rhonchi. HEART:  Regular rate and rhythm. No murmur appreciated. ABDOMEN:  Soft, nontender.  Positive, normoactive bowel sounds. No organomegaly palpated. MSK:  No focal spinal tenderness to palpation. Full range of motion bilaterally in the upper extremities. EXTREMITIES:  No peripheral edema.   SKIN:  Clear with no obvious rashes or skin changes. No nail dyscrasia. NEURO:  Nonfocal. Well oriented.  Appropriate affect. ECOG PERFORMANCE STATUS: 1 - Symptomatic but completely ambulatory  LABORATORY DATA: Lab Results  Component Value Date   WBC 6.7 09/09/2013   HGB 13.9 09/09/2013   HCT 42.5 09/09/2013   MCV 79.1* 09/09/2013   PLT 288 09/09/2013      Chemistry      Component Value Date/Time   NA 140 09/09/2013 0857   NA 136 07/16/2012 0910   K 3.9 09/09/2013 0857   K 4.0 07/16/2012 0910   CL 109* 12/31/2012 0940   CL 103 07/16/2012 0910   CO2 20* 09/09/2013 0857   CO2 21 07/16/2012 0910   BUN 17.9 09/09/2013 0857   BUN 16 07/16/2012 0910   CREATININE 0.9 09/09/2013 0857   CREATININE 0.75 07/16/2012 0910      Component Value Date/Time   CALCIUM 10.3 09/09/2013 0857   CALCIUM 10.1 07/16/2012 0910   ALKPHOS 194* 09/09/2013 0857   ALKPHOS 142* 07/16/2012 0910   AST 33 09/09/2013 0857   AST 30 07/16/2012 0910   ALT 34 09/09/2013 0857   ALT 33 07/16/2012 0910   BILITOT 0.29 09/09/2013 0857   BILITOT 0.2* 07/16/2012 0910       RADIOGRAPHIC STUDIES: CT CHEST, ABDOMEN AND PELVIS WITH CONTRAST  Technique: Contiguous axial images of the chest abdomen and pelvis  were obtained after IV contrast administration.  Contrast: 100 ml Omnipaque-300  Comparison:  11/03/2012  CT CHEST  RECIST protocol 1.0  Target Lesions:  1.Right lower lobe lung "nodule" 5 mm on image 28/series 5 versus 6  mm on the prior.  2. Subcarinal lymph node 9 mm short axis on image 28/series 2  versus 8 mm on the prior.  3. Right hilar node 9 mm on image 26/series 2 versus similar on  the prior exam (when remeasured).  Non Target lesions:  1. Findings suspicious for osseous metastasis. Similar.  Findings: Lung windows demonstrate mild scarring at the right lung  base. Clear left lung.  Soft tissue windows demonstrate no supraclavicular adenopathy.  Right mastectomy and axillary nodal dissection. No axillary  adenopathy. Tortuous descending thoracic aorta. Heart size upper  normal, without pericardial effusion. Trace right-sided pleural  thickening is unchanged. No central pulmonary embolism, on this non-  dedicated study. No mediastinal or hilar adenopathy. No internal  mammary adenopathy.  A sclerotic lesion involving the left posterior 11th rib is similar  on image 45/series 2. This is absent on 08/09/2010.  IMPRESSION:  1. Similar eleventh posterior left rib sclerosis, suspicious for  osseous metastasis.  2. No evidence of extra osseous metastasis within the chest.  CT ABDOMEN AND PELVIS  Findings: Moderate to marked hepatic steatosis. Similar vague  hyperenhancement the right lobe liver on image 52/series 2, likely  perfusion anomaly. There is sparing of steatosis adjacent the  gallbladder. Normal spleen, stomach. Duodenal diverticulum  involves the transverse segment. Normal pancreas, gallbladder,  biliary tract, right gland, kidneys. Minimal left adrenal  nodularity is unchanged. No retroperitoneal or retrocrural  adenopathy.  Normal colon, appendix, and terminal ileum. Normal small bowel  without abdominal ascites.  No evidence of omental or peritoneal disease.  No pelvic adenopathy. Normal urinary bladder and uterus. No  adnexal mass. No significant  free fluid.  Advanced left hip osteoarthritis. Vague sclerosis involving the  left iliac crest is unchanged. A well-defined sclerotic lesion in  the right iliac crest is similar. Subtle sclerosis in the anterior  left sacrum on image 94 is similar.  IMPRESSION:  1. Similar subtle osseous sclerosis, for which metastatic disease  cannot be excluded.  2. No  extra osseous metastasis within the abdomen/pelvis.  3. Moderate to marked hepatic steatosis.   ASSESSMENT/PLAN: 63 year old female with  #1 original diagnosis of breast cancer in 2005. She was treated with a lumpectomy followed by radiation. She apparently did go on antiestrogen therapy. Thereafter she was lost to follow up due to lack of finances and loss of insurance.    2 patient was seen by Dr. Truddie Coco in March 2013 with metastatic recurrent breast cancer that was ER positive PR positive with lung metastasis malignant pleural effusion and malignant pericardial effusion.  She underwent pericardial window as well as Pleurx catheter placement.  #3 she was then begun on BOLERO 4 clinical trial consisting of everolimus 10 mg and letrozole 2.5 mg given on a daily basis on a 28 day cycle. So far she has completed 24 cycles. She is here for cycle number 25.  #4patient had CT scans performed on 08/11/13, one lung  Nodule was stable, the other ones were decreased.  #5 Osteopenia/sclerotic bone lesions: we discussed the use of either Xgeva or zometa. However she is having dental work performed and we will wait to initiate this until she has had a dental clearance  All questions were answered. The patient knows to call the clinic with any problems, questions or concerns. We can certainly see the patient much sooner if necessary.  I spent 25 minutes counseling the patient face to face. The total time spent in the appointment was 30 minutes.  Marcy Panning, MD Medical/Oncology [facility] [phone] (beeper) [phone]  (Office)  09/09/2013, 9:46 AM

## 2013-09-09 NOTE — Progress Notes (Signed)
09/09/13 at 10:07am - Bolero 4 - cycle 26, day 1 study notes- The pt was into the cancer center this morning for her cycle 26 assessments.  The pt returned her cycle 25 everolimus drug box with 14 remaining pills inside.  The pt confirmed that she took her everolimus (10 mg) and letrozole (2.5mg )  as prescribed everyday from 08/12/13 through 09/08/13 (28 days).  The pt was dispensed 70 pills - 56 (pills taken) =14 remaining.  Therefore, the pt is 100 % complaint in her study drug administration for cycle 25.  The pt's everolimus was taken to the pharmacy for the drug accountability check and storage.  The pt confirmed that she had fasted overnight and this morning upon arrival to the cancer center. The pt's labs were drawn via venipuncture.  Dr. Humphrey Rolls reviewed the pt's labs, and she felt that the pt's lab abnormals were "not clinically significant".  The pt was seen and examined today by Dr. Humphrey Rolls.  The pt stated that her "flu symptoms" resolved on 08/30/13.  She said that she stopped taking her Robitussin and Dayquil on 08/30/13.  The research nurse reviewed the pt's concomitant medications.  The pt confirmed that she is taking the following medications:  Lopressor, compazine, Immodium, Lopid, ibuprofen, and Vitamin D3.  The pt said that she will have some dental work in mid-March.  She said that she would notify the research staff if she has to take any antibiotics for her dental procedure.  The pt stated that she took antibiotics in the fall for a dental procedure for 7 days.  The pt denies any new adverse events.  She reports the following AE's as ongoing:  hot flashes, memory impairment, intermittent diarrhea and nausea, arthritis in her hands, bilateral cataracts, moderate fatigue, and bilateral knee pain.  The pt was given her CT scan appointment in March along with her March and April appointments.  The pt is performing most of her usual activities.  The pt said that she has to take breaks often is she is doing  errands.  ECOG=1.  The pt's vitals and weight are stable.  Dr. Humphrey Rolls advised the pt to continue taking her study drugs (everolimus and letrozole) as prescribed for cycle 26.  The research nurse dispensed the pt's cycle 26 everolimus drug box to the pt for self administration.  The pt confirmed that she has enough letrozole at home for cycle 26.

## 2013-09-13 ENCOUNTER — Encounter: Payer: Self-pay | Admitting: Oncology

## 2013-09-13 NOTE — Progress Notes (Signed)
Faxed disability form to [facility] @ [phone]

## 2013-09-16 ENCOUNTER — Telehealth: Payer: Self-pay | Admitting: *Deleted

## 2013-09-16 NOTE — Telephone Encounter (Signed)
09/16/13 at 2:43pm - The research nurse called the pt and confirmed for study purposes that the pt took Robitussin DM (combination of guaifenesin and dextromethorphan) from 08/06/13 to 08/30/13 for her flu-like symptoms.

## 2013-10-05 ENCOUNTER — Encounter (HOSPITAL_COMMUNITY): Payer: Self-pay

## 2013-10-05 ENCOUNTER — Other Ambulatory Visit: Payer: Self-pay

## 2013-10-05 ENCOUNTER — Encounter: Payer: Self-pay | Admitting: *Deleted

## 2013-10-05 ENCOUNTER — Ambulatory Visit (HOSPITAL_COMMUNITY)
Admission: RE | Admit: 2013-10-05 | Discharge: 2013-10-05 | Disposition: A | Discharge status: Home / Self Care | Payer: Medicaid Other | Source: Ambulatory Visit | Attending: Oncology | Admitting: Oncology

## 2013-10-05 ENCOUNTER — Ambulatory Visit: Payer: Self-pay | Admitting: Adult Health

## 2013-10-05 DIAGNOSIS — C7949 Secondary malignant neoplasm of other parts of nervous system: Secondary | ICD-10-CM

## 2013-10-05 DIAGNOSIS — C7952 Secondary malignant neoplasm of bone marrow: Secondary | ICD-10-CM

## 2013-10-05 DIAGNOSIS — C7800 Secondary malignant neoplasm of unspecified lung: Secondary | ICD-10-CM | POA: Insufficient documentation

## 2013-10-05 DIAGNOSIS — C50919 Malignant neoplasm of unspecified site of unspecified female breast: Secondary | ICD-10-CM | POA: Diagnosis present

## 2013-10-05 DIAGNOSIS — C7931 Secondary malignant neoplasm of brain: Secondary | ICD-10-CM | POA: Diagnosis not present

## 2013-10-05 DIAGNOSIS — C7951 Secondary malignant neoplasm of bone: Secondary | ICD-10-CM | POA: Diagnosis not present

## 2013-10-05 DIAGNOSIS — Z9010 Acquired absence of unspecified breast and nipple: Secondary | ICD-10-CM | POA: Insufficient documentation

## 2013-10-05 MED ORDER — IOHEXOL 300 MG/ML  SOLN
50.0000 mL | Freq: Once | INTRAMUSCULAR | Status: AC | PRN
  Administered 2013-10-05: 50 mL via ORAL

## 2013-10-05 MED ORDER — IOHEXOL 300 MG/ML  SOLN
100.0000 mL | Freq: Once | INTRAMUSCULAR | Status: AC | PRN
  Administered 2013-10-05: 100 mL via INTRAVENOUS

## 2013-10-05 NOTE — Progress Notes (Signed)
10/05/13 at 3:48am - RECIST 1.0 Discussion with Dr. Maryland Pink.   Research nurse reviewed the pt's CT scans today.  The research nurse noted that Dr. Maryland Pink stated that the "right lower lobe lung nodule measured 6 mm and was unchanged".  The pt's January 2015 scan stated the "right lower lobe nodule no longer visualized on today's exam" as read by Dr. Kris Hartmann.  The research nurse called and spoke to Dr. Vernell Barrier about the pt's scans done today.  He stated that he did go back to the January 2015 CT scan as well as the pt's prior 7 CT scans to fully evaluate this "right lower lobe lung nodule".  He said that this "nodule" was in fact present on the January 2015 scan with a measurement of 6 mm.  He stated that this pt has no evidence of disease progression radiographically.   Therefore, the research nurse will amend the RECIST table and add the "right lower lobe lung nodule" with a measurement of 6 mm on the pt's 08/11/13 CT scan.  The pt remains at a partial response.  The pt will be seen and evaluated clinically by Dr. Humphrey Rolls on 10/07/13.

## 2013-10-07 ENCOUNTER — Other Ambulatory Visit: Payer: Self-pay | Admitting: *Deleted

## 2013-10-07 ENCOUNTER — Telehealth: Payer: Self-pay | Admitting: Oncology

## 2013-10-07 ENCOUNTER — Ambulatory Visit (HOSPITAL_BASED_OUTPATIENT_CLINIC_OR_DEPARTMENT_OTHER): Payer: Self-pay | Admitting: Oncology

## 2013-10-07 ENCOUNTER — Encounter: Payer: Self-pay | Admitting: *Deleted

## 2013-10-07 ENCOUNTER — Encounter: Payer: Self-pay | Admitting: Oncology

## 2013-10-07 ENCOUNTER — Other Ambulatory Visit (HOSPITAL_BASED_OUTPATIENT_CLINIC_OR_DEPARTMENT_OTHER): Payer: Self-pay

## 2013-10-07 VITALS — BP 112/83 | HR 97 | Temp 97.9°F | Resp 18 | Ht 65.0 in | Wt 183.0 lb

## 2013-10-07 DIAGNOSIS — C50919 Malignant neoplasm of unspecified site of unspecified female breast: Secondary | ICD-10-CM

## 2013-10-07 DIAGNOSIS — C7951 Secondary malignant neoplasm of bone: Secondary | ICD-10-CM

## 2013-10-07 DIAGNOSIS — C7800 Secondary malignant neoplasm of unspecified lung: Secondary | ICD-10-CM

## 2013-10-07 DIAGNOSIS — R5381 Other malaise: Secondary | ICD-10-CM

## 2013-10-07 DIAGNOSIS — R5383 Other fatigue: Secondary | ICD-10-CM

## 2013-10-07 DIAGNOSIS — C7952 Secondary malignant neoplasm of bone marrow: Secondary | ICD-10-CM

## 2013-10-07 DIAGNOSIS — M8580 Other specified disorders of bone density and structure, unspecified site: Secondary | ICD-10-CM

## 2013-10-07 LAB — COMPREHENSIVE METABOLIC PANEL (CC13)
ALBUMIN: 3.7 g/dL (ref 3.5–5.0)
ALK PHOS: 228 U/L — AB (ref 40–150)
ALT: 41 U/L (ref 0–55)
AST: 38 U/L — ABNORMAL HIGH (ref 5–34)
Anion Gap: 13 mEq/L — ABNORMAL HIGH (ref 3–11)
BUN: 16.6 mg/dL (ref 7.0–26.0)
CALCIUM: 10.6 mg/dL — AB (ref 8.4–10.4)
CO2: 20 mEq/L — ABNORMAL LOW (ref 22–29)
Chloride: 107 mEq/L (ref 98–109)
Creatinine: 1 mg/dL (ref 0.6–1.1)
GLUCOSE: 159 mg/dL — AB (ref 70–140)
POTASSIUM: 4.1 meq/L (ref 3.5–5.1)
Sodium: 141 mEq/L (ref 136–145)
Total Bilirubin: 0.36 mg/dL (ref 0.20–1.20)
Total Protein: 8.4 g/dL — ABNORMAL HIGH (ref 6.4–8.3)

## 2013-10-07 LAB — LIPID PANEL
Cholesterol: 247 mg/dL — ABNORMAL HIGH (ref 0–200)
HDL: 52 mg/dL (ref >39)
LDL CALC: 131 mg/dL — AB (ref 0–99)
TRIGLYCERIDES: 318 mg/dL — AB (ref <150)
Total CHOL/HDL Ratio: 4.8 Ratio
VLDL: 64 mg/dL — ABNORMAL HIGH (ref 0–40)

## 2013-10-07 LAB — CBC WITH DIFFERENTIAL/PLATELET
BASO%: 1 % (ref 0.0–2.0)
BASOS ABS: 0.1 10*3/uL (ref 0.0–0.1)
EOS%: 2 % (ref 0.0–7.0)
Eosinophils Absolute: 0.1 10*3/uL (ref 0.0–0.5)
HEMATOCRIT: 42.1 % (ref 34.8–46.6)
HEMOGLOBIN: 14.3 g/dL (ref 11.6–15.9)
LYMPH%: 13.3 % — ABNORMAL LOW (ref 14.0–49.7)
MCH: 26.3 pg (ref 25.1–34.0)
MCHC: 33.9 g/dL (ref 31.5–36.0)
MCV: 77.4 fL — AB (ref 79.5–101.0)
MONO#: 0.9 10*3/uL (ref 0.1–0.9)
MONO%: 12.2 % (ref 0.0–14.0)
NEUT%: 71.5 % (ref 38.4–76.8)
NEUTROS ABS: 5.1 10*3/uL (ref 1.5–6.5)
PLATELETS: 284 10*3/uL (ref 145–400)
RBC: 5.44 10*6/uL (ref 3.70–5.45)
RDW: 14.2 % (ref 11.2–14.5)
WBC: 7.1 10*3/uL (ref 3.9–10.3)
lymph#: 0.9 10*3/uL (ref 0.9–3.3)

## 2013-10-07 LAB — PHOSPHORUS: PHOSPHORUS: 3.3 mg/dL (ref 2.3–4.6)

## 2013-10-07 LAB — BILIRUBIN, DIRECT

## 2013-10-07 LAB — URIC ACID (CC13): URIC ACID, SERUM: 3.8 mg/dL (ref 2.6–7.4)

## 2013-10-07 LAB — CK: Total CK: 77 U/L (ref 7–177)

## 2013-10-07 MED ORDER — INV-EVEROLIMUS (RAD0001) 5MG TABLET NOVARTIS CRAD001Y24135: 2.0000 | ORAL_TABLET | Freq: Every day | ORAL | Status: DC

## 2013-10-07 NOTE — Telephone Encounter (Signed)
, °

## 2013-10-07 NOTE — Progress Notes (Signed)
10/07/13 at 10:54am - [employer] Bolero 4 - cycle 27, day 1 study notes - The pt was into the cancer center this morning for her cycle 27 assessments.  She confirmed that she had been fasting overnight and this morning prior to her lab draw.  The pt returned her cycle 26 everolimus drug kit.  The pt stated that she took her everolimus (10 mg) and letrozole (2.5 mg) daily as prescribed from 09/09/13 through 10/06/13 (28 days).  The pt's everolimus drug box had 14 remaining pills inside.  The pt was dispensed 70 pills - 56 taken (28 days x 2 doses = 56 pills) = 14 returned.  Therefore, the pt was 100% compliant with her study drug, everolimus for cycle 26.  The pt's everolimus was taken to the pharmacy for the drug accountability check and storage.  The pt was seen and examined this morning by Dr. Humphrey Rolls.  Dr. Humphrey Rolls reviewed her labs and felt that her "lab abnormals" were "not clinically significant".  Dr. Humphrey Rolls reviewed the pt's CT scans and she stated that the pt's scans show no evidence of disease progression.  She stated that the pt needs to continue with her current treatment.  The research nurse discussed the pt's target lesions with Dr. Humphrey Rolls.  It was explained that on the 08/11/13 scans, the radiologist could not provide a measurement for the target lesion #1 ( RLL lung nodule).  However, Dr. Maryland Pink verified on 10/05/13 that the lesion was indeed present on the previous scan.  The lesion appears stable with no change in measurement.  Dr. Humphrey Rolls stated that agrees with Dr. Maryland Pink that the lesion was present in January 2015.  She confirmed that the pt has maintained her partial response.  The MD signed the pt's RECIST tables for verification.  The pt reports that her only new symptom is some "lightheadedness/dizziness" that started last week on 09/27/13.  The pt said that she found out last week that her friend of over 9 years have been diagnosed with "stage 4 breast cancer".  The pt said that she took the news very hard  and believes that this contributed to her lightheadedness last week.  Dr. Humphrey Rolls agreed with the pt that it was "stress" related to her friend's diagnosis.  The pt reports the following AE's as ongoing:  Hot flashes, memory impairment, nausea, diarrhea, arthritis in her hands, bilateral cataracts, fatigue, and bilateral knee pain.  The pt denies any new concomitant medications.  She confirmed that she remains on the following medications:  Lopressor, compazine, immodium, lopid, ibuprofen, and vitamin D3. The pt's weight and vitals were stable.  She is performing all of her usual activities with some limitations.  ECOG=1.   The pt was delighted to hear that her scans were "good".  The pt stated that she cannot believe that it has been 2 years since starting her treatment.  The pt was thanked for her continued support of this clinical trial.  The pt is aware of her future appointments in April.  The pt was dispensed her everolimus drug box for self administration for cycle 27.  The pt confirmed that she has enough letrozole at home for this cycle.

## 2013-10-08 NOTE — Progress Notes (Signed)
OFFICE PROGRESS NOTE  CC  Barbara Smolder, MD [address] [place] [address]  DIAGNOSIS: 63 year old female with metastatic breast cancer with bone metastasis and lung metastasis.  PRIOR THERAPY:  #1 patient originally presented when she was 63 years old with early stage breast cancer in 2005. At that time she underwent a lumpectomy with axillary lymph node dissection. She was followed by Dr. Eston Esters and Collier Salina young. Her last visit to Dr. Julien Girt office was in 2006. Because of difficulties with insurance she had not been receiving mammograms.  #2 in 2012 patient began having shortness of breath on exertion. She was seen by pulmonary service and a CT scan showed some minor mediastinal adenopathy as well as borderline lower lobe nodule. PET scan was not performed. October 2012 patient began to experience more shortness of breath. Initially she was told that she had pneumonia. However subsequently she developed lower extremity edema. CT of the chest was obtained that showed a large pleural effusion in the right side as well as pericardial effusion. She was seen by cardiothoracic surgery placed in 09/11/2011. The cytology revealed that patient indeed have recurrent breast cancer that was hormone receptor positive HER-2/neu was not obtained as it was insufficient tissue. She subsequently had a CT scan performed that showed 2 nodules in the lung suspicious for metastatic disease as well as left lower lobe collapse. She had a biopsy of the lungs performed on 09/11/2011 the tumor was estrogen receptor +91% progesterone receptor +100% proliferation marker Ki-67 35%.  #3 patient was seen by Dr. Eston Esters and he recommended that she be enrolled on the BOLERO-4 clinical trial utilizing letrozole 2.5 mg per day and affinitor10 mg per day. She initiated this on 10/10/2011.she has had a significant response to this lower extremity edema has reduced significantly in size she is no  longer short of breath.  CURRENT THERAPY:BOLERO -4 with everolimus 10 mg and letrozole 2.5 mg daily. Patient will begin cycle 27 day 1 today. ( 28 day cycles)  INTERVAL HISTORY: Barbara Parks 63 y.o. female returns for followup visit prior to cycle 27.  She is doing well today.   She is doing better today.  She is fatigued.  She does have some joint aches, hot flashes at night, that are toelrable.  She denies hot flashes, skin changes, headaches, new pain, or any further concerns. I reviewed patient's bone density scan with her. She does have some osteopenia. Her CT scan also did reveal sclerotic lesions in the ribs as well as the thorax. Consistent with healing metastasis. I do think she would be a good candidate for either xgeva or zometa. However she is gong to have some dental work done and I have recommended to hold off on these agents until after. This is primarily tdue to  possibility of developing 01J.  Otherwise, a 10 point ROS is neg.   MEDICAL HISTORY: Past Medical History  Diagnosis Date  . Asthma   . Shortness of breath   . Headache(784.0)     OTC Prilosec  . Arthritis     left hip  . Contact dermatitis     from a bandaid that had Latex   . breast ca 2005    breast/chemo R mastectomy  . Metastasis to lung dx'd 08/2011  . Osteopenia due to cancer therapy 09/09/2013    ALLERGIES:  is allergic to aspirin; latex; and nsaids.  MEDICATIONS:  Current Outpatient Prescriptions  Medication Sig Dispense Refill  .  cholecalciferol (VITAMIN D) 1000 UNITS tablet Take 2,000 Units by mouth daily.       Marland Kitchen gemfibrozil (LOPID) 600 MG tablet Take 1 tablet (600 mg total) by mouth 2 (two) times daily before a meal.  60 tablet  1  . ibuprofen (ADVIL,MOTRIN) 200 MG tablet Take 400 mg by mouth every 6 (six) hours as needed. For pain      . Investigational everolimus (RAD001) 5 MG tablet [employer] [id number] Take 2 tablets by mouth daily. Take with a glass of water.  70 tablet  0  . letrozole  (FEMARA) 2.5 MG tablet Take 1 tablet (2.5 mg total) by mouth daily.  90 tablet  6  . loperamide (IMODIUM) 2 MG capsule Take 2 mg by mouth 4 (four) times daily as needed for diarrhea or loose stools.      . metoprolol tartrate (LOPRESSOR) 25 MG tablet Take 0.5 tablets (12.5 mg total) by mouth daily.  30 tablet  4  . prochlorperazine (COMPAZINE) 10 MG tablet Take 10 mg by mouth every 6 (six) hours as needed. For nausea      . DM-Phenylephrine-Acetaminophen (VICKS DAYQUIL COLD & FLU PO) Take by mouth. Started on Aug 06, 2013. Taking as directed      . Investigational everolimus (RAD001) 5 MG tablet [employer] [id number] Take 2 tablets by mouth daily. Take with a glass of water.  70 tablet  0  . Investigational everolimus (RAD001) 5 MG tablet [employer] [id number] Take 2 tablets by mouth daily. Take with a glass of water.  70 tablet  0  . Investigational everolimus (RAD001) 5 MG tablet [employer] [id number] Take 2 tablets by mouth daily. Take with a glass of water.  70 tablet  0   No current facility-administered medications for this visit.    SURGICAL HISTORY:  Past Surgical History  Procedure Laterality Date  . Spine surgery  1996  . Breast surgery  2005    right  . Video bronchoscopy  09/11/2011    Procedure: VIDEO BRONCHOSCOPY;  Surgeon: Gaye Pollack, MD;  Location: [place];  Service: Thoracic;  Laterality: N/A;  . Chest tube insertion  09/11/2011    Procedure: INSERTION PLEURAL DRAINAGE CATHETER;  Surgeon: Gaye Pollack, MD;  Location: [place];  Service: Thoracic;  Laterality: Right;  . Pericardial window  09/11/2011    Procedure: PERICARDIAL WINDOW;  Surgeon: Gaye Pollack, MD;  Location: [place];  Service: Thoracic;  Laterality: N/A;  . Removal of pleural drainage catheter  12/19/2011    Procedure: REMOVAL OF PLEURAL DRAINAGE CATHETER;  Surgeon: Gaye Pollack, MD;  Location: [place];  Service: Thoracic;  Laterality: Right;  TO BE DONE IN MINOR ROOM, SHORT STAY    REVIEW OF SYSTEMS:  A 10  point review of systems was conducted and is otherwise negative except for what is noted above.    PHYSICAL EXAMINATION: Blood pressure 112/83, pulse 97, temperature 97.9 F (36.6 C), temperature source Oral, resp. rate 18, height 5' 5"  (1.651 m), weight 183 lb (83.008 kg). Body mass index is 30.45 kg/(m^2). GENERAL: Patient is a well appearing female in no acute distress HEENT:  Sclerae anicteric.  Oropharynx clear and moist. No ulcerations or evidence of oropharyngeal candidiasis. Neck is supple.  NODES:  No cervical, supraclavicular, or axillary lymphadenopathy palpated.  BREAST EXAM:  Deferred. LUNGS:  Clear to auscultation bilaterally.  No wheezes or rhonchi. HEART:  Regular rate and rhythm. No murmur appreciated. ABDOMEN:  Soft, nontender.  Positive, normoactive bowel sounds.  No organomegaly palpated. MSK:  No focal spinal tenderness to palpation. Full range of motion bilaterally in the upper extremities. EXTREMITIES:  No peripheral edema.   SKIN:  Clear with no obvious rashes or skin changes. No nail dyscrasia. NEURO:  Nonfocal. Well oriented.  Appropriate affect. ECOG PERFORMANCE STATUS: 1 - Symptomatic but completely ambulatory    LABORATORY DATA: Lab Results  Component Value Date   WBC 7.1 10/07/2013   HGB 14.3 10/07/2013   HCT 42.1 10/07/2013   MCV 77.4* 10/07/2013   PLT 284 10/07/2013      Chemistry      Component Value Date/Time   NA 141 10/07/2013 0849   NA 136 07/16/2012 0910   K 4.1 10/07/2013 0849   K 4.0 07/16/2012 0910   CL 109* 12/31/2012 0940   CL 103 07/16/2012 0910   CO2 20* 10/07/2013 0849   CO2 21 07/16/2012 0910   BUN 16.6 10/07/2013 0849   BUN 16 07/16/2012 0910   CREATININE 1.0 10/07/2013 0849   CREATININE 0.75 07/16/2012 0910      Component Value Date/Time   CALCIUM 10.6* 10/07/2013 0849   CALCIUM 10.1 07/16/2012 0910   ALKPHOS 228* 10/07/2013 0849   ALKPHOS 142* 07/16/2012 0910   AST 38* 10/07/2013 0849   AST 30 07/16/2012 0910   ALT 41 10/07/2013  0849   ALT 33 07/16/2012 0910   BILITOT 0.36 10/07/2013 0849   BILITOT 0.2* 07/16/2012 0910       RADIOGRAPHIC STUDIES: CT CHEST, ABDOMEN AND PELVIS WITH CONTRAST  Technique: Contiguous axial images of the chest abdomen and pelvis  were obtained after IV contrast administration.  Contrast: 100 ml Omnipaque-300  Comparison: 11/03/2012  CT CHEST  RECIST protocol 1.0  Target Lesions:  1.Right lower lobe lung "nodule" 5 mm on image 28/series 5 versus 6  mm on the prior.  2. Subcarinal lymph node 9 mm short axis on image 28/series 2  versus 8 mm on the prior.  3. Right hilar node 9 mm on image 26/series 2 versus similar on  the prior exam (when remeasured).  Non Target lesions:  1. Findings suspicious for osseous metastasis. Similar.  Findings: Lung windows demonstrate mild scarring at the right lung  base. Clear left lung.  Soft tissue windows demonstrate no supraclavicular adenopathy.  Right mastectomy and axillary nodal dissection. No axillary  adenopathy. Tortuous descending thoracic aorta. Heart size upper  normal, without pericardial effusion. Trace right-sided pleural  thickening is unchanged. No central pulmonary embolism, on this non-  dedicated study. No mediastinal or hilar adenopathy. No internal  mammary adenopathy.  A sclerotic lesion involving the left posterior 11th rib is similar  on image 45/series 2. This is absent on 08/09/2010.  IMPRESSION:  1. Similar eleventh posterior left rib sclerosis, suspicious for  osseous metastasis.  2. No evidence of extra osseous metastasis within the chest.  CT ABDOMEN AND PELVIS  Findings: Moderate to marked hepatic steatosis. Similar vague  hyperenhancement the right lobe liver on image 52/series 2, likely  perfusion anomaly. There is sparing of steatosis adjacent the  gallbladder. Normal spleen, stomach. Duodenal diverticulum  involves the transverse segment. Normal pancreas, gallbladder,  biliary tract, right gland,  kidneys. Minimal left adrenal  nodularity is unchanged. No retroperitoneal or retrocrural  adenopathy.  Normal colon, appendix, and terminal ileum. Normal small bowel  without abdominal ascites.  No evidence of omental or peritoneal disease.  No pelvic adenopathy. Normal urinary bladder and uterus. No  adnexal mass. No significant free  fluid.  Advanced left hip osteoarthritis. Vague sclerosis involving the  left iliac crest is unchanged. A well-defined sclerotic lesion in  the right iliac crest is similar. Subtle sclerosis in the anterior  left sacrum on image 94 is similar.  IMPRESSION:  1. Similar subtle osseous sclerosis, for which metastatic disease  cannot be excluded.  2. No extra osseous metastasis within the abdomen/pelvis.  3. Moderate to marked hepatic steatosis.   ASSESSMENT/PLAN: 63 year old female with  #1 original diagnosis of breast cancer in 2005. She was treated with a lumpectomy followed by radiation. She apparently did go on antiestrogen therapy. Thereafter she was lost to follow up due to lack of finances and loss of insurance.    2 patient was seen by Dr. Truddie Coco in March 2013 with metastatic recurrent breast cancer that was ER positive PR positive with lung metastasis malignant pleural effusion and malignant pericardial effusion.  She underwent pericardial window as well as Pleurx catheter placement.  #3 she was then begun on BOLERO 4 clinical trial consisting of everolimus 10 mg and letrozole 2.5 mg given on a daily basis on a 28 day cycle. So far she has completed 26 cycles. She is here for cycle number 27.  #4patient had CT scans performed on 08/11/13, one lung  Nodule was stable, the other ones were decreased.  #5 Osteopenia/sclerotic bone lesions: we discussed the use of either Xgeva or zometa. However she is having dental work performed and we will wait to initiate this until she has had a dental clearance  All questions were answered. The patient knows to call  the clinic with any problems, questions or concerns. We can certainly see the patient much sooner if necessary.  I spent 25 minutes counseling the patient face to face. The total time spent in the appointment was 30 minutes.  Marcy Panning, MD Medical/Oncology [facility] [phone] (beeper) [phone] (Office)  10/08/2013, 7:07 AM

## 2013-11-04 ENCOUNTER — Telehealth: Payer: Self-pay | Admitting: Adult Health

## 2013-11-04 ENCOUNTER — Other Ambulatory Visit (HOSPITAL_BASED_OUTPATIENT_CLINIC_OR_DEPARTMENT_OTHER): Payer: Self-pay

## 2013-11-04 ENCOUNTER — Encounter: Payer: Self-pay | Admitting: *Deleted

## 2013-11-04 ENCOUNTER — Other Ambulatory Visit: Payer: Self-pay | Admitting: *Deleted

## 2013-11-04 ENCOUNTER — Ambulatory Visit (HOSPITAL_BASED_OUTPATIENT_CLINIC_OR_DEPARTMENT_OTHER): Payer: Self-pay | Admitting: Adult Health

## 2013-11-04 VITALS — BP 124/84 | HR 98 | Temp 97.9°F | Resp 20 | Ht 65.0 in | Wt 180.8 lb

## 2013-11-04 DIAGNOSIS — R7309 Other abnormal glucose: Secondary | ICD-10-CM

## 2013-11-04 DIAGNOSIS — C7952 Secondary malignant neoplasm of bone marrow: Secondary | ICD-10-CM

## 2013-11-04 DIAGNOSIS — C50919 Malignant neoplasm of unspecified site of unspecified female breast: Secondary | ICD-10-CM

## 2013-11-04 DIAGNOSIS — C50419 Malignant neoplasm of upper-outer quadrant of unspecified female breast: Secondary | ICD-10-CM

## 2013-11-04 DIAGNOSIS — C7800 Secondary malignant neoplasm of unspecified lung: Secondary | ICD-10-CM

## 2013-11-04 DIAGNOSIS — C7951 Secondary malignant neoplasm of bone: Secondary | ICD-10-CM

## 2013-11-04 DIAGNOSIS — R739 Hyperglycemia, unspecified: Secondary | ICD-10-CM

## 2013-11-04 LAB — COMPREHENSIVE METABOLIC PANEL (CC13)
ALBUMIN: 3.6 g/dL (ref 3.5–5.0)
ALT: 31 U/L (ref 0–55)
ANION GAP: 10 meq/L (ref 3–11)
AST: 35 U/L — AB (ref 5–34)
Alkaline Phosphatase: 225 U/L — ABNORMAL HIGH (ref 40–150)
BUN: 20.1 mg/dL (ref 7.0–26.0)
CALCIUM: 10.3 mg/dL (ref 8.4–10.4)
CO2: 21 meq/L — AB (ref 22–29)
Chloride: 109 mEq/L (ref 98–109)
Creatinine: 0.9 mg/dL (ref 0.6–1.1)
Glucose: 161 mg/dl — ABNORMAL HIGH (ref 70–140)
POTASSIUM: 4 meq/L (ref 3.5–5.1)
Sodium: 141 mEq/L (ref 136–145)
Total Bilirubin: 0.3 mg/dL (ref 0.20–1.20)
Total Protein: 8.1 g/dL (ref 6.4–8.3)

## 2013-11-04 LAB — CBC WITH DIFFERENTIAL/PLATELET
BASO%: 1 % (ref 0.0–2.0)
Basophils Absolute: 0.1 10*3/uL (ref 0.0–0.1)
EOS ABS: 0.2 10*3/uL (ref 0.0–0.5)
EOS%: 2.3 % (ref 0.0–7.0)
HEMATOCRIT: 41.6 % (ref 34.8–46.6)
HGB: 13.8 g/dL (ref 11.6–15.9)
LYMPH%: 12.6 % — ABNORMAL LOW (ref 14.0–49.7)
MCH: 25.8 pg (ref 25.1–34.0)
MCHC: 33.2 g/dL (ref 31.5–36.0)
MCV: 77.7 fL — ABNORMAL LOW (ref 79.5–101.0)
MONO#: 0.9 10*3/uL (ref 0.1–0.9)
MONO%: 11.1 % (ref 0.0–14.0)
NEUT%: 73 % (ref 38.4–76.8)
NEUTROS ABS: 5.7 10*3/uL (ref 1.5–6.5)
Platelets: 297 10*3/uL (ref 145–400)
RBC: 5.36 10*6/uL (ref 3.70–5.45)
RDW: 14.3 % (ref 11.2–14.5)
WBC: 7.8 10*3/uL (ref 3.9–10.3)
lymph#: 1 10*3/uL (ref 0.9–3.3)

## 2013-11-04 LAB — URIC ACID (CC13): URIC ACID, SERUM: 3.4 mg/dL (ref 2.6–7.4)

## 2013-11-04 LAB — CK: CK TOTAL: 65 U/L (ref 7–177)

## 2013-11-04 LAB — LIPID PANEL
CHOLESTEROL: 238 mg/dL — AB (ref 0–200)
HDL: 50 mg/dL (ref >39)
LDL Cholesterol: 132 mg/dL — ABNORMAL HIGH (ref 0–99)
Total CHOL/HDL Ratio: 4.8 Ratio
Triglycerides: 281 mg/dL — ABNORMAL HIGH (ref <150)
VLDL: 56 mg/dL — AB (ref 0–40)

## 2013-11-04 LAB — PHOSPHORUS: Phosphorus: 3.1 mg/dL (ref 2.3–4.6)

## 2013-11-04 LAB — BILIRUBIN, DIRECT

## 2013-11-04 MED ORDER — INV-EVEROLIMUS (RAD0001) 5MG TABLET NOVARTIS CRAD001Y24135: 2.0000 | ORAL_TABLET | Freq: Every day | ORAL | Status: DC

## 2013-11-04 NOTE — Telephone Encounter (Signed)
, °

## 2013-11-04 NOTE — Progress Notes (Signed)
11/04/13 at 10:36am - [employer], Bolero 4 - cycle 28, day 1 study notes- The pt was into the cancer center this am for her fasting labs and cycle 28 assessments.  The pt confirmed that she was fasting upon arrival to the cancer center for her labs.  The pt returned her cycle 27 everolimus study drug box.  She confirmed that she took her everolimus as prescribed (10 mg) everyday along with her letrozole (2.5mg ) from 10/07/13 through 11/03/13 (28 days).  The pt's everolimus drug box had 14 remaining unopened blister packs.  The pt was dispensed 70 pills -56 taken (28 days x 2 doses=56 pills= 14 returned.  Therefore, the pt is 100% compliant with her study drug, everolimus for cycle 27.  The pt's everolimus was taken to the pharmacy for the drug accountability check and storage.  The pt was seen and examined this morning by Charlestine Massed, NP (Dr. Laurelyn Sickle advanced practice provider).  The NP reviewed her labs.  She noted that her "fasting glucose" is increasing (hyperglycemia -grade 2).  The pt was advised to modify her diet.  The pt was given some dietary guidelines to follow.  The NP also ordered a hemoglobin A1c for the pt to monitor her glucose levels.  No dose reductions are required for a grade 2 hyperglycemia per stated in the protocol.  The NP felt that the pt's hyperglycemia was related to her study drug, everolimus.  The NP felt that the pt's other lab abnormals were "not clinically significant".  The pt states that her left hip is "acting up".  The pt reports that her left hip is always painful due to her advanced osteoarthritis, but she denies any worsening grade from baseline.  The pt latest CT results confirmed the "degenerative changes of the left hip and lower lumbar spine".   The pt denies any dizziness.  She denies any new medications.  She confirmed that she is still taking the following medications:  Lopressor, compazine, immodium, lopid, ibuprofen, and vitamin D3.  Her medication list was reviewed with  the pt and no changes were reported.  The pt states the following AE's as ongoing:  Hot flashes, memory impairment, intermittent nausea and diarrhea, arthritis in hands, bilateral cataracts, fatigue, and bilateral knee pain.  The pt's weight and vitals were stable.  She is performing all of her usual activities with some limitations.  The pt mows and weeds her yard, but she stops frequently for "rest breaks".  The pt is aware of her appointments in May and June.  The pt was dispensed her everolimus drug box for self administrationfor cycle 28.

## 2013-11-04 NOTE — Patient Instructions (Signed)
Diabetes Meal Planning Guide The diabetes meal planning guide is a tool to help you plan your meals and snacks. It is important for people with diabetes to manage their blood glucose (sugar) levels. Choosing the right foods and the right amounts throughout your day will help control your blood glucose. Eating right can even help you improve your blood pressure and reach or maintain a healthy weight. CARBOHYDRATE COUNTING MADE EASY When you eat carbohydrates, they turn to sugar. This raises your blood glucose level. Counting carbohydrates can help you control this level so you feel better. When you plan your meals by counting carbohydrates, you can have more flexibility in what you eat and balance your medicine with your food intake. Carbohydrate counting simply means adding up the total amount of carbohydrate grams in your meals and snacks. Try to eat about the same amount at each meal. Foods with carbohydrates are listed below. Each portion below is 1 carbohydrate serving or 15 grams of carbohydrates. Ask your dietician how many grams of carbohydrates you should eat at each meal or snack. Grains and Starches  1 slice bread.   English muffin or hotdog/hamburger bun.   cup cold cereal (unsweetened).   cup cooked pasta or rice.   cup starchy vegetables (corn, potatoes, peas, beans, winter squash).  1 tortilla (6 inches).   bagel.  1 waffle or pancake (size of a CD).   cup cooked cereal.  4 to 6 small crackers. *Whole grain is recommended. Fruit  1 cup fresh unsweetened berries, melon, papaya, pineapple.  1 small fresh fruit.   banana or mango.   cup fruit juice (4 oz unsweetened).   cup canned fruit in natural juice or water.  2 tbs dried fruit.  12 to 15 grapes or cherries. Milk and Yogurt  1 cup fat-free or 1% milk.  1 cup soy milk.  6 oz light yogurt with sugar-free sweetener.  6 oz low-fat soy yogurt.  6 oz plain yogurt. Vegetables  1 cup raw or  cup  cooked is counted as 0 carbohydrates or a "free" food.  If you eat 3 or more servings at 1 meal, count them as 1 carbohydrate serving. Other Carbohydrates   oz chips or pretzels.   cup ice cream or frozen yogurt.   cup sherbet or sorbet.  2 inch square cake, no frosting.  1 tbs honey, sugar, jam, jelly, or syrup.  2 small cookies.  3 squares of graham crackers.  3 cups popcorn.  6 crackers.  1 cup broth-based soup.  Count 1 cup casserole or other mixed foods as 2 carbohydrate servings.  Foods with less than 20 calories in a serving may be counted as 0 carbohydrates or a "free" food. You may want to purchase a book or computer software that lists the carbohydrate gram counts of different foods. In addition, the nutrition facts panel on the labels of the foods you eat are a good source of this information. The label will tell you how big the serving size is and the total number of carbohydrate grams you will be eating per serving. Divide this number by 15 to obtain the number of carbohydrate servings in a portion. Remember, 1 carbohydrate serving equals 15 grams of carbohydrate. SERVING SIZES Measuring foods and serving sizes helps you make sure you are getting the right amount of food. The list below tells how big or small some common serving sizes are.  1 oz.........4 stacked dice.  3 oz.........Deck of cards.  1 tsp........Tip   of little finger.  1 tbs......Marland KitchenMarland KitchenThumb.  2 tbs.......Marland KitchenGolf ball.   cup......Marland KitchenHalf of a fist.  1 cup.......Marland KitchenA fist. SAMPLE DIABETES MEAL PLAN Below is a sample meal plan that includes foods from the grain and starches, dairy, vegetable, fruit, and meat groups. A dietician can individualize a meal plan to fit your calorie needs and tell you the number of servings needed from each food group. However, controlling the total amount of carbohydrates in your meal or snack is more important than making sure you include all of the food groups at every  meal. You may interchange carbohydrate containing foods (dairy, starches, and fruits). The meal plan below is an example of a 2000 calorie diet using carbohydrate counting. This meal plan has 17 carbohydrate servings. Breakfast  1 cup oatmeal (2 carb servings).   cup light yogurt (1 carb serving).  1 cup blueberries (1 carb serving).   cup almonds. Snack  1 large apple (2 carb servings).  1 low-fat string cheese stick. Lunch  Chicken breast salad.  1 cup spinach.   cup chopped tomatoes.  2 oz chicken breast, sliced.  2 tbs low-fat [place] dressing.  12 whole-wheat crackers (2 carb servings).  12 to 15 grapes (1 carb serving).  1 cup low-fat milk (1 carb serving). Snack  1 cup carrots.   cup hummus (1 carb serving). Dinner  3 oz broiled salmon.  1 cup brown rice (3 carb servings). Snack  1  cups steamed broccoli (1 carb serving) drizzled with 1 tsp olive oil and lemon juice.  1 cup light pudding (2 carb servings). DIABETES MEAL PLANNING WORKSHEET Your dietician can use this worksheet to help you decide how many servings of foods and what types of foods are right for you.  BREAKFAST Food Group and Servings / Carb Servings Grain/Starches __________________________________ Dairy __________________________________________ Vegetable ______________________________________ Fruit ___________________________________________ Meat __________________________________________ Fat ____________________________________________ LUNCH Food Group and Servings / Carb Servings Grain/Starches ___________________________________ Dairy ___________________________________________ Fruit ____________________________________________ Meat ___________________________________________ Fat _____________________________________________ [place] Food Group and Servings / Carb Servings Grain/Starches ___________________________________ Dairy  ___________________________________________ Fruit ____________________________________________ Meat ___________________________________________ Fat _____________________________________________ SNACKS Food Group and Servings / Carb Servings Grain/Starches ___________________________________ Dairy ___________________________________________ Vegetable _______________________________________ Fruit ____________________________________________ Meat ___________________________________________ Fat _____________________________________________ DAILY TOTALS Starches _________________________ Vegetable ________________________ Fruit ____________________________ Dairy ____________________________ Meat ____________________________ Fat ______________________________ Document Released: 04/11/2005 Document Revised: 10/07/2011 Document Reviewed: 02/20/2009 [employer] Patient Information 2014 [employer] Diets for Diabetes, Food Labeling Look at food labels to help you decide how much of a product you can eat. You will want to check the amount of total carbohydrate in a serving to see how the food fits into your meal plan. In the list of ingredients, the ingredient present in the largest amount by weight must be listed first, followed by the other ingredients in descending order. STANDARD OF IDENTITY Most products have a list of ingredients. However, foods that the Food and Drug Administration (FDA) has given a standard of identity do not need a list of ingredients. A standard of identity means that a food must contain certain ingredients if it is called a particular name. Examples are mayonnaise, peanut butter, ketchup, jelly, and cheese. LABELING TERMS There are many terms found on food labels. Some of these terms have specific definitions. Some terms are regulated by the FDA, and the FDA has clearly specified how they can be used. Others are not regulated or well-defined and can be misleading and  confusing. SPECIFICALLY DEFINED TERMS Nutritive Sweetener.  A sweetener that contains calories,such as table sugar or  honey. Nonnutritive Sweetener.  A sweetener with few or no calories,such as saccharin, aspartame, sucralose, and cyclamate. LABELING TERMS REGULATED BY THE FDA Free.  The product contains only a tiny or small amount of fat, cholesterol, sodium, sugar, or calories. For example, a "fat-free" product will contain less than 0.5 g of fat per serving. Low.  A food described as "low" in fat, saturated fat, cholesterol, sodium, or calories could be eaten fairly often without exceeding dietary guidelines. For example, "low in fat" means no more than 3 g of fat per serving. Lean.  "Lean" and "extra lean" are U.[place]. Department of Agriculture Scientist, research (physical sciences)) terms for use on meat and poultry products. "Lean" means the product contains less than 10 g of fat, 4 g of saturated fat, and 95 mg of cholesterol per serving. "Lean" is not as low in fat as a product labeled "low." Extra Lean.  "Extra lean" means the product contains less than 5 g of fat, 2 g of saturated fat, and 95 mg of cholesterol per serving. While "extra lean" has less fat than "lean," it is still higher in fat than a product labeled "low." Reduced, Less, Fewer.  A diet product that contains 25% less of a nutrient or calories than the regular version. For example, hot dogs might be labeled "25% less fat than our regular hot dogs." Light/Lite.  A diet product that contains  fewer calories or  the fat of the original. For example, "light in sodium" means a product with  the usual sodium. More.  One serving contains at least 10% more of the daily value of a vitamin, mineral, or fiber than usual. Good Source Of.  One serving contains 10% to 19% of the daily value for a particular vitamin, mineral, or fiber. Excellent Source Of.  One serving contains 20% or more of the daily value for a particular nutrient. Other terms used might  be "high in" or "rich in." Enriched or Fortified.  The product contains added vitamins, minerals, or protein. Nutrition labeling must be used on enriched or fortified foods. Imitation.  The product has been altered so that it is lower in protein, vitamins, or minerals than the usual food,such as imitation peanut butter. Total Fat.  The number listed is the total of all fat found in a serving of the product. Under total fat, food labels must list saturated fat and trans fat, which are associated with raising bad cholesterol and an increased risk of heart blood vessel disease. Saturated Fat.  Mainly fats from animal-based sources. Some examples are red meat, cheese, cream, whole milk, and coconut oil. Trans Fat.  Found in some fried snack foods, packaged foods, and fried restaurant foods. It is recommended you eat as close to 0 g of trans fat as possible, since it raises bad cholesterol and lowers good cholesterol. Polyunsaturated and Monounsaturated Fats.  More healthful fats. These fats are from plant sources. Total Carbohydrate.  The number of carbohydrate grams in a serving of the product. Under total carbohydrate are listed the other carbohydrate sources, such as dietary fiber and sugars. Dietary Fiber.  A carbohydrate from plant sources. Sugars.  Sugars listed on the label contain all naturally occurring sugars as well as added sugars. LABELING TERMS NOT REGULATED BY THE FDA Sugarless.  Table sugar (sucrose) has not been added. However, the manufacturer may use another form of sugar in place of sucrose to sweeten the product. For example, sugar alcohols are used to sweeten foods. Sugar alcohols are a form  of sugar but are not table sugar. If a product contains sugar alcohols in place of sucrose, it can still be labeled "sugarless." Low Salt, Salt-Free, Unsalted, No Salt, No Salt Added, Without Added Salt.  Food that is usually processed with salt has been made without salt.  However, the food may contain sodium-containing additives, such as preservatives, leavening agents, or flavorings. Natural.  This term has no legal meaning. Organic.  Foods that are certified as organic have been inspected and approved by the USDA to ensure they are produced without pesticides, fertilizers containing synthetic ingredients, bioengineering, or ionizing radiation. Document Released: 07/18/2003 Document Revised: 10/07/2011 Document Reviewed: 02/02/2009 [facility] Patient Information 2014 [place], [place].

## 2013-11-04 NOTE — Progress Notes (Signed)
Hematology and Oncology Follow Up Visit  Barbara Parks [id number] April 15, 1951 63 y.o. 11/05/2013 9:01 AM     Principle Diagnosis:Barbara Parks 63 y.o. female with metastatic breast cancer to the lung and bone.   Prior Therapy:  #1 patient originally presented when she was 63 years old with early stage breast cancer in 2005. At that time she underwent a lumpectomy with axillary lymph node dissection. She was followed by Dr. Eston Esters and Collier Salina young. Her last visit to Dr. Julien Girt office was in 2006. Because of difficulties with insurance she had not been receiving mammograms.   #2 in 2012 patient began having shortness of breath on exertion. She was seen by pulmonary service and a CT scan showed some minor mediastinal adenopathy as well as borderline lower lobe nodule. PET scan was not performed. October 2012 patient began to experience more shortness of breath. Initially she was told that she had pneumonia. However subsequently she developed lower extremity edema. CT of the chest was obtained that showed a large pleural effusion in the right side as well as pericardial effusion. She was seen by cardiothoracic surgery placed in 09/11/2011. The cytology revealed that patient indeed have recurrent breast cancer that was hormone receptor positive HER-2/neu was not obtained as it was insufficient tissue. She subsequently had a CT scan performed that showed 2 nodules in the lung suspicious for metastatic disease as well as left lower lobe collapse. She had a biopsy of the lungs performed on 09/11/2011 the tumor was estrogen receptor +91% progesterone receptor +100% proliferation marker Ki-67 35%.   #3 patient was seen by Dr. Eston Esters and he recommended that she be enrolled on the BOLERO-4 clinical trial utilizing letrozole 2.5 mg per day and affinitor10 mg per day. She initiated this on 10/10/2011.she has had a significant response to this lower extremity edema has reduced significantly in size she  is no longer short of breath.  Current therapy:  BOLERO -4 with everolimus 10 mg and letrozole 2.5 mg daily. Patient will begin cycle 28 day 1 today. ( 28 day cycles)  Interim History: Barbara Parks 62 y.o. female with metastatic breast cancer here for her monthly evaluation on the BOLERO-4 clinical trial.  She is doing moderately well today.  She is having pain in her left hip that goes down her leg and extends into the knee.  She has had previous lower back surgery and degenerative arthritis of the left hip.  She is otherwise doing well today and denies fevers, chills, nausea, vomiting, constipation, diarrhea, numbness, or any further cocnerns.  At her last appointment Dr. Humphrey Rolls had recommended bisphosphanate therapy. She has an appointment with the dentist tomorrow and will likely have dental work before her next appointment with Dr. Humphrey Rolls in May.  She last had scans in March, 2015 and is scheduled for repeat scans in May, 2015.    Medications:  Current Outpatient Prescriptions  Medication Sig Dispense Refill  . Investigational everolimus (RAD001) 5 MG tablet [employer] [id number] Take 2 tablets by mouth daily. Take with a glass of water.  70 tablet  0  . letrozole (FEMARA) 2.5 MG tablet Take 1 tablet (2.5 mg total) by mouth daily.  90 tablet  6  . loperamide (IMODIUM) 2 MG capsule Take 2 mg by mouth 4 (four) times daily as needed for diarrhea or loose stools.      . metoprolol tartrate (LOPRESSOR) 25 MG tablet Take 0.5 tablets (12.5 mg total) by mouth daily.  30 tablet  4  . prochlorperazine (COMPAZINE) 10 MG tablet Take 10 mg by mouth every 6 (six) hours as needed. For nausea      . cholecalciferol (VITAMIN D) 1000 UNITS tablet Take 2,000 Units by mouth daily.       Marland Kitchen gemfibrozil (LOPID) 600 MG tablet Take 1 tablet (600 mg total) by mouth 2 (two) times daily before a meal.  60 tablet  1  . ibuprofen (ADVIL,MOTRIN) 200 MG tablet Take 400 mg by mouth every 6 (six) hours as needed. For pain      .  Investigational everolimus (RAD001) 5 MG tablet [employer] [id number] Take 2 tablets by mouth daily. Take with a glass of water.  70 tablet  0  . Investigational everolimus (RAD001) 5 MG tablet [employer] [id number] Take 2 tablets by mouth daily. Take with a glass of water.  70 tablet  0   No current facility-administered medications for this visit.     Allergies:  Allergies  Allergen Reactions  . Aspirin     REACTION: upset stomach  . Latex Other (See Comments)    Blistering and skin peels off  . Nsaids Nausea And Vomiting    Extreme nausea and vomiting    Medical History: Past Medical History  Diagnosis Date  . Asthma   . Shortness of breath   . Headache(784.0)     OTC Prilosec  . Arthritis     left hip  . Contact dermatitis     from a bandaid that had Latex   . breast ca 2005    breast/chemo R mastectomy  . Metastasis to lung dx'd 08/2011  . Osteopenia due to cancer therapy 09/09/2013    Surgical History:  Past Surgical History  Procedure Laterality Date  . Spine surgery  1996  . Breast surgery  2005    right  . Video bronchoscopy  09/11/2011    Procedure: VIDEO BRONCHOSCOPY;  Surgeon: Gaye Pollack, MD;  Location: [facility];  Service: Thoracic;  Laterality: N/A;  . Chest tube insertion  09/11/2011    Procedure: INSERTION PLEURAL DRAINAGE CATHETER;  Surgeon: Gaye Pollack, MD;  Location: [facility];  Service: Thoracic;  Laterality: Right;  . Pericardial window  09/11/2011    Procedure: PERICARDIAL WINDOW;  Surgeon: Gaye Pollack, MD;  Location: [facility];  Service: Thoracic;  Laterality: N/A;  . Removal of pleural drainage catheter  12/19/2011    Procedure: REMOVAL OF PLEURAL DRAINAGE CATHETER;  Surgeon: Gaye Pollack, MD;  Location: [facility];  Service: Thoracic;  Laterality: Right;  TO BE DONE IN MINOR ROOM, SHORT STAY     Review of Systems: A 10 point review of systems was conducted and is otherwise negative except for what is noted above.     Physical Exam: Blood  pressure 124/84, pulse 98, temperature 97.9 F (36.6 C), temperature source Oral, resp. rate 20, height 5' 5"  (1.651 m), weight 180 lb 12.8 oz (82.01 kg). GENERAL: Patient is a well appearing female in no acute distress HEENT:  Sclerae anicteric.  Oropharynx clear and moist. No ulcerations or evidence of oropharyngeal candidiasis. Neck is supple.  NODES:  No cervical, supraclavicular, or axillary lymphadenopathy palpated.  BREAST EXAM:  Deferred. LUNGS:  Clear to auscultation bilaterally.  No wheezes or rhonchi. HEART:  Regular rate and rhythm. No murmur appreciated. ABDOMEN:  Soft, nontender.  Positive, normoactive bowel sounds. No organomegaly palpated. MSK:  No focal spinal tenderness to palpation. Full range of motion bilaterally in the upper extremities. EXTREMITIES:  No peripheral edema.   SKIN:  Clear with no obvious rashes or skin changes. No nail dyscrasia. NEURO:  Nonfocal. Well oriented.  Appropriate affect. ECOG PERFORMANCE STATUS: 1 - Symptomatic but completely ambulatory   Lab Results: Lab Results  Component Value Date   WBC 7.8 11/04/2013   HGB 13.8 11/04/2013   HCT 41.6 11/04/2013   MCV 77.7* 11/04/2013   PLT 297 11/04/2013     Chemistry      Component Value Date/Time   NA 141 11/04/2013 0850   NA 136 07/16/2012 0910   K 4.0 11/04/2013 0850   K 4.0 07/16/2012 0910   CL 109* 12/31/2012 0940   CL 103 07/16/2012 0910   CO2 21* 11/04/2013 0850   CO2 21 07/16/2012 0910   BUN 20.1 11/04/2013 0850   BUN 16 07/16/2012 0910   CREATININE 0.9 11/04/2013 0850   CREATININE 0.75 07/16/2012 0910      Component Value Date/Time   CALCIUM 10.3 11/04/2013 0850   CALCIUM 10.1 07/16/2012 0910   ALKPHOS 225* 11/04/2013 0850   ALKPHOS 142* 07/16/2012 0910   AST 35* 11/04/2013 0850   AST 30 07/16/2012 0910   ALT 31 11/04/2013 0850   ALT 33 07/16/2012 0910   BILITOT 0.30 11/04/2013 0850   BILITOT 0.2* 07/16/2012 0910       Radiological Studies: CLINICAL DATA  Right breast cancer with brain, lung,  and rib metastases. Oral  chemotherapy ongoing.  EXAM  CT CHEST, ABDOMEN, AND PELVIS WITH CONTRAST  TECHNIQUE  Multidetector CT imaging of the chest, abdomen and pelvis was  performed following the standard protocol during bolus  administration of intravenous contrast.  CONTRAST  189m OMNIPAQUE IOHEXOL 300 MG/ML SOLN  COMPARISON  08/11/2013  RECIST 1.0  Target Lesions:  1. Right lower lobe lung nodule measures 6 mm (series 5/image 40),  unchanged  2. Subcarinal lymph node measures 7 mm short axis (series 2/image  20), previously 9 mm  3. Right hilar lymph node measures 7 mm short axis (series 2/image  28), unchanged  Non-target Lesions:  1. Small right pleural effusion has resolved  2. Superior segment right lower lobe lung nodule is not visualized  FINDINGS  CT CHEST FINDINGS  Faint subpleural nodularity measuring 2-3 mm in the lateral right  upper lobe (series 5/ image 11), likely post  infectious/inflammatory. Right lower lobe scarring with associated 6  mm subpleural nodule (series 5/ image 40), unchanged.  No new/suspicious pulmonary nodules. No pleural effusion or  pneumothorax.  Visualized thyroid is unremarkable.  Mild cardiomegaly. No pericardial effusion.  6 mm short axis right supraclavicular node (series 2/image 5),  unchanged. No suspicious mediastinal, hilar, or axillary  lymphadenopathy.  Status post right mastectomy with right axillary lymph node  dissection.  Mild degenerative changes of the thoracic spine.  CT ABDOMEN AND PELVIS FINDINGS  Liver is notable for hepatic steatosis. Linear hypervascular lesion  along the posterior right hepatic dome (series 2/image 54),  unchanged, possibly reflecting a perfusion anomaly or focal fatty  sparing. Additional geographic hyperenhancement in the medial  segment left hepatic lobe, adjacent to the gallbladder fossa (series  2/image 59), also favored to reflect altered perfusion and/or focal  fatty sparing.   Spleen, pancreas, and adrenal glands are within normal limits.  Stable nodularity at the gallbladder fundus (series 2/image 66),  favored to reflect gallbladder adenomyomatosis.  Kidneys are within normal limits. No hydronephrosis.  No evidence of bowel obstruction. Normal appendix.  Atherosclerotic calcifications of the abdominal aorta  and branch  vessels.  No abdominopelvic ascites.  No suspicious abdominopelvic lymphadenopathy.  Uterus and bilateral ovaries are unremarkable.  Bladder is within normal limits.  Scattered faint sclerotic lesions in the pelvis, most prominently in  the left iliac wing (series 2/ image 94) and left sacrum (series 2/  image 97), unchanged. Degenerative changes of the left hip and lower  lumbar spine.  IMPRESSION  Status post right mastectomy and right axillary lymph node  dissection.  Stable sclerotic osseous metastases in the pelvis, as described  above.  Otherwise, no evidence of metastatic disease in the chest, abdomen,  and pelvis.  Stable 6 mm short axis right supraclavicular node. Attention on  follow-up suggested.  RECIST 1.0 measurements as above.  SIGNATURE  Electronically Signed  By: Julian Hy M.D.  On: 10/05/2013 15:20   Assessment and Plan: LIELLE VANDERVORT 63 y.o. female with  1. Metastatic breast cancer to the lung and bone.  See previous history above.  The patient is on the BOLERO-4 clinical trial with Everolimus 88m daily and Letrozole 2.556mdaily on a 28 day cycle.  She continues to tolerate this medication very well and is doing well.  Her last scans in March, 2015 were stable to slightly improved.  She will proceed with cycle 28 day 1 today.  She has CT scans due on 11/30/13 and f/u with Dr. KhHumphrey Rollscheduled on 12/02/13.  I reviewed her labs in detail.  She does have some mild hyperglycemia, discussed below.    2. Osteopenia/sclerotic bone lesions.  Patient has discussed bisphosphanate therapy with Dr. KhHumphrey Rolls She will undergo  dental work this month and therapy will be re-evaluated at her next appointment with Dr. KhHumphrey Rollsn 12/02/13.  3. Hyperglycemia.  The patient has a fasting blood sugar of 161 today.  In looking back at her previous blood sugars, they have been slowly creeping up over the past few months from 146 to now 161.  We discussed lifestyle modifications today including a diabetic diet and exercise.  I will order a hemoglobin A1C to be drawn at her next lab appointment.    The patient will return on 11/30/13 for CT scans and on 12/02/13 for labs and evaluation by Dr. KhHumphrey Rolls She knows to call [facility] the interim for any questions or concerns.  We can certainly see her sooner if needed.    I spent 25 minutes counseling the patient face to face.  The total time spent in the appointment was 30 minutes.  LiMinette HeadlandNPLa Port[place]3([phone]/04/2014 9:01 AM

## 2013-11-05 ENCOUNTER — Encounter: Payer: Self-pay | Admitting: Adult Health

## 2013-11-24 ENCOUNTER — Telehealth: Payer: Self-pay | Admitting: Oncology

## 2013-11-24 NOTE — Telephone Encounter (Signed)
kk out pt to see lc 5/7. lmonvm for pt and mailed schedule.

## 2013-11-30 ENCOUNTER — Other Ambulatory Visit: Payer: Self-pay

## 2013-11-30 ENCOUNTER — Ambulatory Visit (HOSPITAL_COMMUNITY)
Admission: RE | Admit: 2013-11-30 | Discharge: 2013-11-30 | Disposition: A | Discharge status: Home / Self Care | Payer: Medicaid Other | Source: Ambulatory Visit | Attending: Oncology | Admitting: Oncology

## 2013-11-30 ENCOUNTER — Encounter (HOSPITAL_COMMUNITY): Payer: Self-pay

## 2013-11-30 DIAGNOSIS — Z79899 Other long term (current) drug therapy: Secondary | ICD-10-CM | POA: Diagnosis not present

## 2013-11-30 DIAGNOSIS — Z9010 Acquired absence of unspecified breast and nipple: Secondary | ICD-10-CM | POA: Insufficient documentation

## 2013-11-30 DIAGNOSIS — C50919 Malignant neoplasm of unspecified site of unspecified female breast: Secondary | ICD-10-CM | POA: Insufficient documentation

## 2013-11-30 MED ORDER — IOHEXOL 300 MG/ML  SOLN
100.0000 mL | Freq: Once | INTRAMUSCULAR | Status: AC | PRN
  Administered 2013-11-30: 100 mL via INTRAVENOUS

## 2013-11-30 MED ORDER — IOHEXOL 300 MG/ML  SOLN
50.0000 mL | Freq: Once | INTRAMUSCULAR | Status: AC | PRN
  Administered 2013-11-30: 50 mL via ORAL

## 2013-12-01 IMAGING — CT CT ABD-PELV W/ CM
2 of 5 series · 15 of 46 positions shown, 17 images · IV contrast (OMNIPAQUE)
Comparison: 03/24/2012

CLINICAL DATA: Breast cancer, lung metastases, status post cycle 8
of chemotherapy, shortness of breath, cough

CT CHEST, ABDOMEN AND PELVIS WITH CONTRAST
TECHNIQUE: Multidetector CT imaging of the chest, abdomen and
pelvis was performed following the standard protocol during bolus
administration of intravenous contrast.
Contrast: 100mL OMNIPAQUE IOHEXOL 300 MG/ML  SOLN

[Series 2: cap with st · axial · 0.91mm/px · z∈[-590,-50]mm · 12 of 124 slices shown, 14 images]
[im 8/124  soft-tissue]
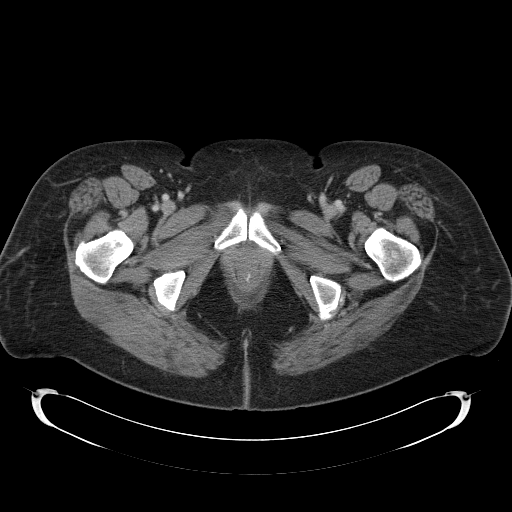
[im 8/124  bone]
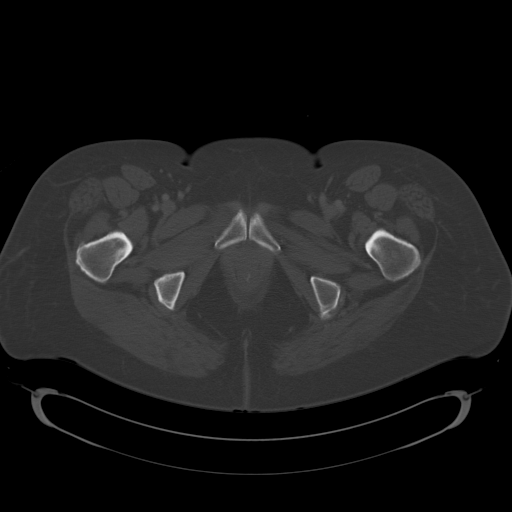
[im 22/124  soft-tissue]
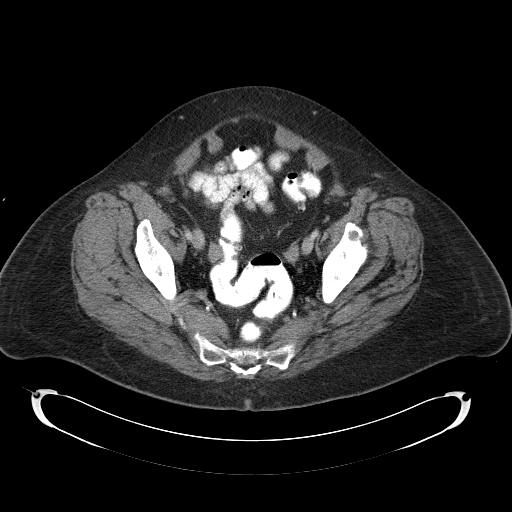
[im 29/124  soft-tissue]
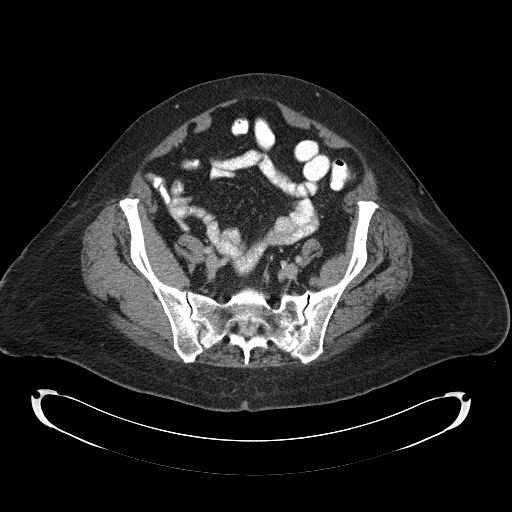
[im 37/124  soft-tissue]
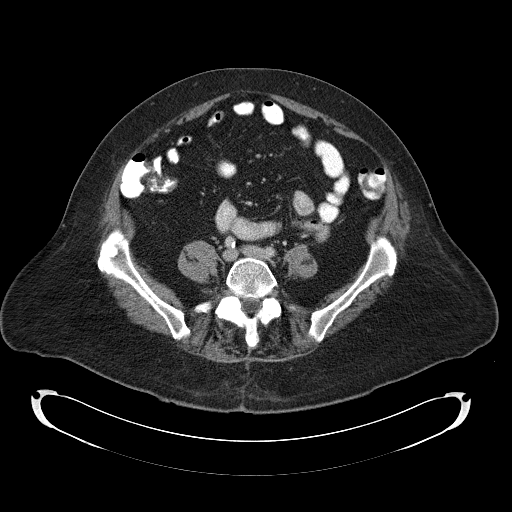
[im 51/124  soft-tissue]
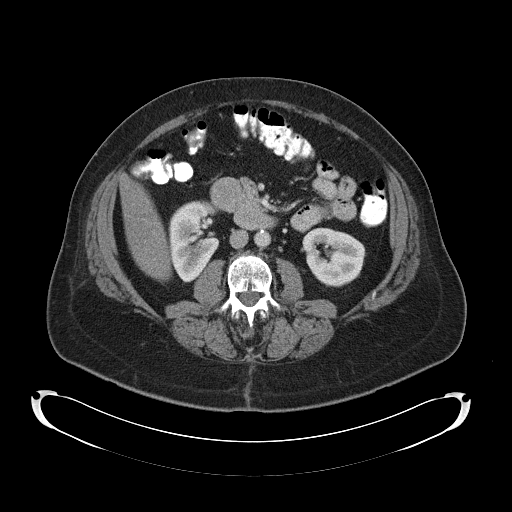
[im 58/124  soft-tissue]
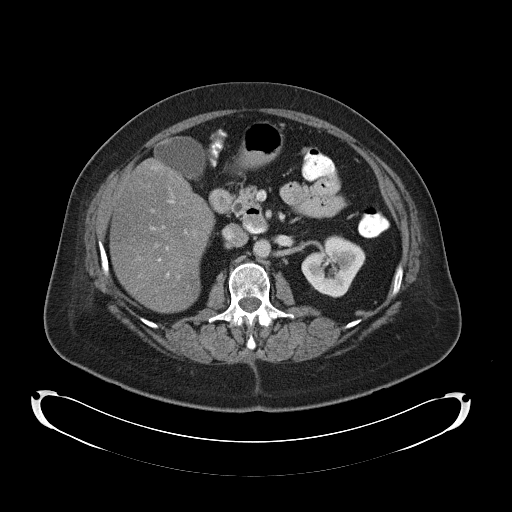
[im 66/124  soft-tissue]
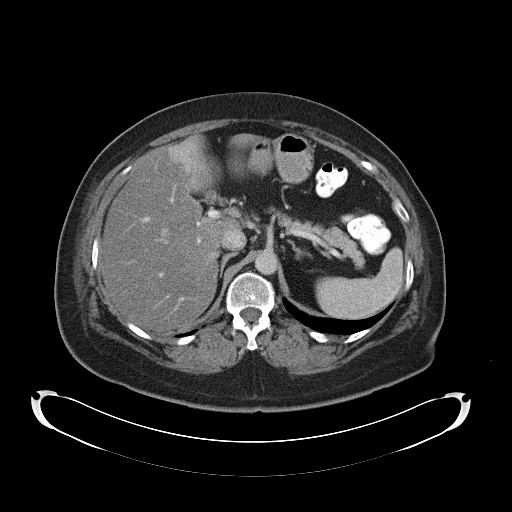
[im 80/124  soft-tissue]
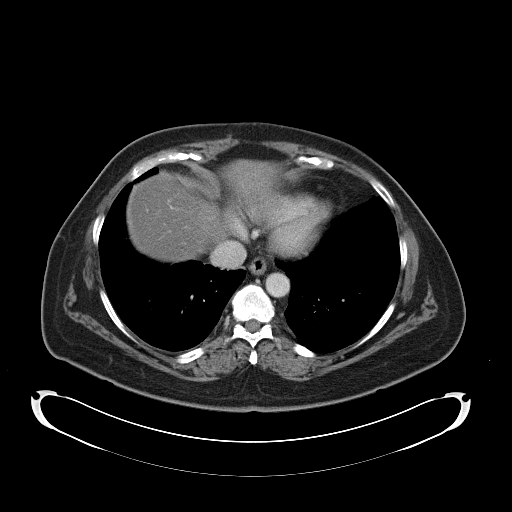
[im 87/124  soft-tissue]
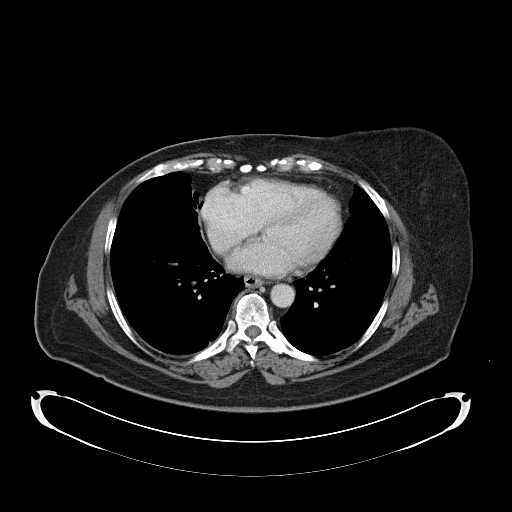
[im 87/124  bone]
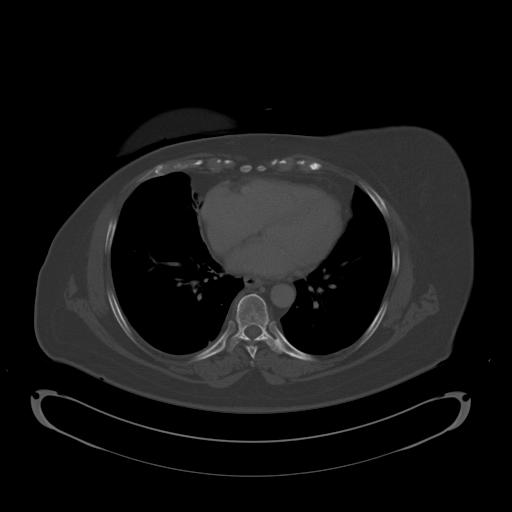
[im 95/124  soft-tissue]
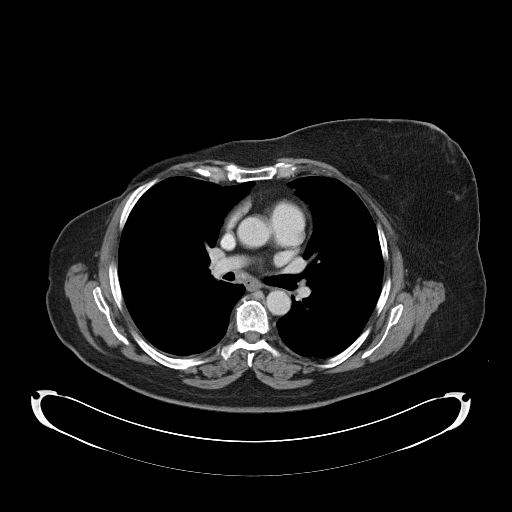
[im 109/124  soft-tissue]
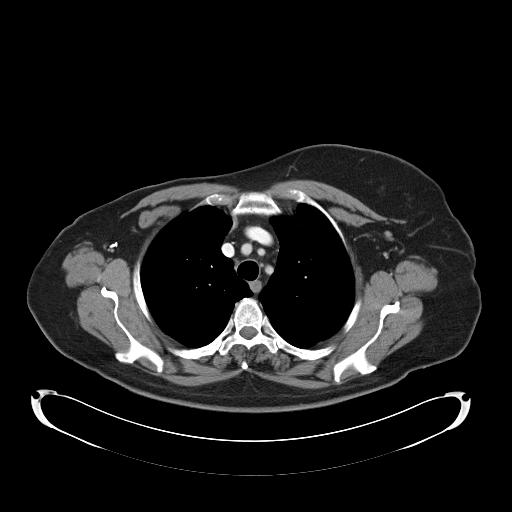
[im 116/124  soft-tissue]
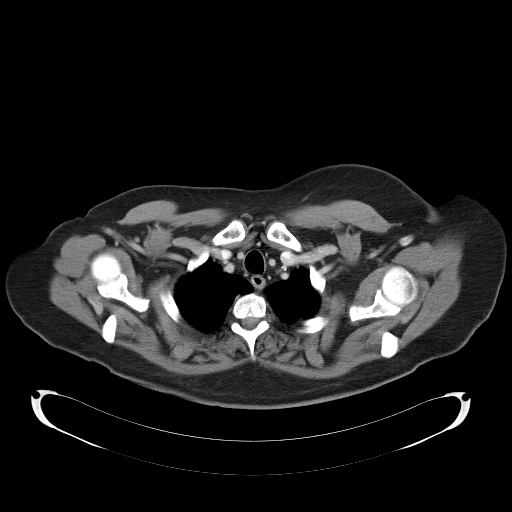

[Series 602: <mpr thick range> · coronal · 1.21mm/px · 3 of 94 slices shown]
[im 32/94  soft-tissue]
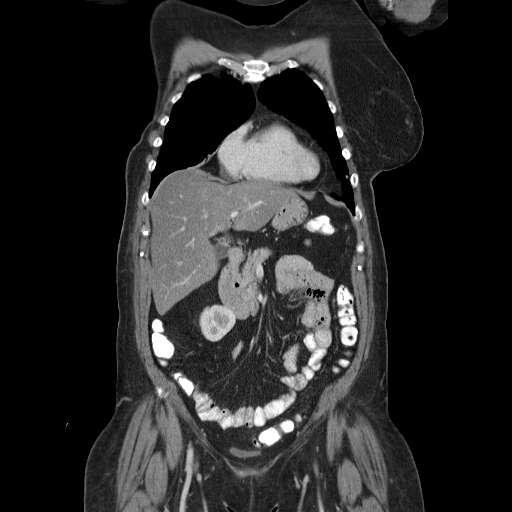
[im 42/94  soft-tissue]
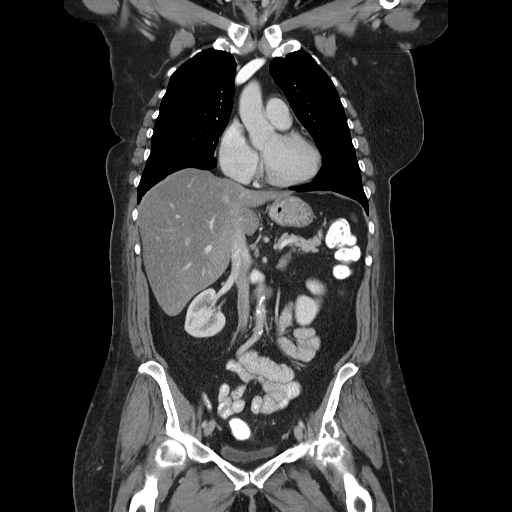
[im 52/94  soft-tissue]
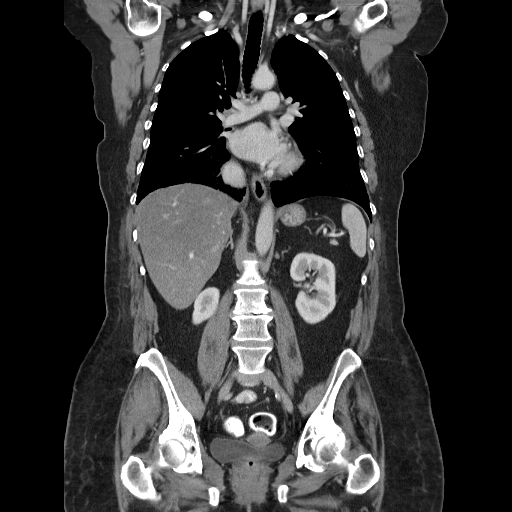

[15 of 46 positions shown; findings below may reference images not displayed]

---

RECIST

Target Lesions:

1. RLL lung nodule - 6 mm (series 4/image 40), unchanged

2. Subcarinal lymph node - 8 mm (series 2/image 30), unchanged

3. Right hilar lymph node - 6 mm (series 2/image 28), previously 10
mm

Non-target Lesions:

1. Small right pleural effusion - trace pleural fluid, improved

2. Superior segment RLL lung nodule - present/stable (series
4/image 30)

---

CT CHEST
FINDINGS: 6 mm right lower lobe nodule (series 4/image 40).
Additional mild nodularity in the right lung is unchanged (series
4/images 29, 30, and 39).  Linear scarring/atelectasis in the right
lower lobe. No pleural effusion or pneumothorax.

Visualized thyroid is unremarkable.

The heart is normal in size.  No pericardial effusion.
Atherosclerotic calcifications of the aortic arch.

Small calcified mediastinal/subcarinal nodes.  Previously described
right hilar node now measures approximately 6 mm (series 2/image
28).  Partially calcified subcarinal node now measures 8 mm (series
2/image 30).  No suspicious axillary lymphadenopathy.

Status post right mastectomy with right axillary lymph node
dissection.

Stable sclerotic lesion involving the left posterior medial 11th
rib (series 2/image 48).  Degenerative changes of the thoracic
spine.
IMPRESSION: No evidence of new/progressive metastatic disease in the chest.

Stable right lung nodularity measuring up to 6 mm, unchanged.
Stable small mediastinal/right hilar nodes, some of which are
partially calcified.  Stable sclerotic left 11th rib lesion.

RECIST 1.0 as above.

CT ABDOMEN AND PELVIS
FINDINGS: Severe hepatic steatosis.  Increased perfusion in the
gallbladder fossa is favored to reflect fatty sparing/perfusion
anomaly (series 2/image 59).  Additional increase perfusion in the
posterior segment right hepatic lobe (series 2/image 54),
unchanged.

Spleen, pancreas, and adrenal gland are within normal limits.  Mild
stable nodularity of the left adrenal gland (series 2/image 61).

Gallbladder is notable for probable fundal adenomyosis (series
2/image 9).  Common duct measures up to 6 mm.

Kidneys are within normal limits.  No hydronephrosis.

No evidence of bowel obstruction.  Normal appendix.

Atherosclerotic calcifications of the abdominal aorta and branch
vessels.

No abdominopelvic ascites.

No suspicious abdominopelvic lymphadenopathy.  Uterus and bilateral
ovaries are unremarkable.

Bladder is within normal limits.

Stable 7 mm sclerotic lesion in the right iliac crest (series
2/image 88).  Degenerative changes of the left hip and lumbar
spine.
IMPRESSION: No evidence of new/progressive metastatic disease in the
abdomen/pelvis.

Stable sclerotic right iliac lesion.

Hepatic steatosis with suspected perfusion anomalies/fatty sparing
in the gallbladder fossa and right hepatic lobe.

RECIST 1.0 as above.

## 2013-12-02 ENCOUNTER — Ambulatory Visit (HOSPITAL_BASED_OUTPATIENT_CLINIC_OR_DEPARTMENT_OTHER): Payer: Self-pay | Admitting: Adult Health

## 2013-12-02 ENCOUNTER — Encounter: Payer: Self-pay | Admitting: *Deleted

## 2013-12-02 ENCOUNTER — Telehealth: Payer: Self-pay | Admitting: Adult Health

## 2013-12-02 ENCOUNTER — Other Ambulatory Visit (HOSPITAL_BASED_OUTPATIENT_CLINIC_OR_DEPARTMENT_OTHER): Payer: Self-pay

## 2013-12-02 ENCOUNTER — Other Ambulatory Visit: Payer: Self-pay | Admitting: *Deleted

## 2013-12-02 ENCOUNTER — Encounter: Payer: Self-pay | Admitting: Adult Health

## 2013-12-02 VITALS — BP 120/78 | HR 94 | Temp 97.8°F | Resp 20 | Ht 65.0 in | Wt 181.2 lb

## 2013-12-02 DIAGNOSIS — M949 Disorder of cartilage, unspecified: Secondary | ICD-10-CM

## 2013-12-02 DIAGNOSIS — C50919 Malignant neoplasm of unspecified site of unspecified female breast: Secondary | ICD-10-CM

## 2013-12-02 DIAGNOSIS — C7800 Secondary malignant neoplasm of unspecified lung: Secondary | ICD-10-CM

## 2013-12-02 DIAGNOSIS — C50419 Malignant neoplasm of upper-outer quadrant of unspecified female breast: Secondary | ICD-10-CM

## 2013-12-02 DIAGNOSIS — R739 Hyperglycemia, unspecified: Secondary | ICD-10-CM

## 2013-12-02 DIAGNOSIS — C7952 Secondary malignant neoplasm of bone marrow: Secondary | ICD-10-CM

## 2013-12-02 DIAGNOSIS — C7951 Secondary malignant neoplasm of bone: Secondary | ICD-10-CM

## 2013-12-02 DIAGNOSIS — M899 Disorder of bone, unspecified: Secondary | ICD-10-CM

## 2013-12-02 LAB — BILIRUBIN, DIRECT

## 2013-12-02 LAB — CK: CK TOTAL: 67 U/L (ref 7–177)

## 2013-12-02 LAB — COMPREHENSIVE METABOLIC PANEL (CC13)
ALT: 46 U/L (ref 0–55)
AST: 42 U/L — AB (ref 5–34)
Albumin: 3.3 g/dL — ABNORMAL LOW (ref 3.5–5.0)
Alkaline Phosphatase: 221 U/L — ABNORMAL HIGH (ref 40–150)
Anion Gap: 11 mEq/L (ref 3–11)
BUN: 15.1 mg/dL (ref 7.0–26.0)
CO2: 19 mEq/L — ABNORMAL LOW (ref 22–29)
Calcium: 10.2 mg/dL (ref 8.4–10.4)
Chloride: 112 mEq/L — ABNORMAL HIGH (ref 98–109)
Creatinine: 0.9 mg/dL (ref 0.6–1.1)
Glucose: 154 mg/dl — ABNORMAL HIGH (ref 70–140)
POTASSIUM: 4 meq/L (ref 3.5–5.1)
Sodium: 142 mEq/L (ref 136–145)
TOTAL PROTEIN: 7.6 g/dL (ref 6.4–8.3)
Total Bilirubin: 0.25 mg/dL (ref 0.20–1.20)

## 2013-12-02 LAB — CBC WITH DIFFERENTIAL/PLATELET
BASO%: 0.8 % (ref 0.0–2.0)
Basophils Absolute: 0.1 10*3/uL (ref 0.0–0.1)
EOS%: 2.8 % (ref 0.0–7.0)
Eosinophils Absolute: 0.2 10*3/uL (ref 0.0–0.5)
HCT: 40.1 % (ref 34.8–46.6)
HEMOGLOBIN: 13.4 g/dL (ref 11.6–15.9)
LYMPH#: 1 10*3/uL (ref 0.9–3.3)
LYMPH%: 14.7 % (ref 14.0–49.7)
MCH: 25.9 pg (ref 25.1–34.0)
MCHC: 33.4 g/dL (ref 31.5–36.0)
MCV: 77.5 fL — ABNORMAL LOW (ref 79.5–101.0)
MONO#: 0.8 10*3/uL (ref 0.1–0.9)
MONO%: 11.9 % (ref 0.0–14.0)
NEUT#: 4.8 10*3/uL (ref 1.5–6.5)
NEUT%: 69.8 % (ref 38.4–76.8)
Platelets: 293 10*3/uL (ref 145–400)
RBC: 5.18 10*6/uL (ref 3.70–5.45)
RDW: 14.3 % (ref 11.2–14.5)
WBC: 6.8 10*3/uL (ref 3.9–10.3)

## 2013-12-02 LAB — LIPID PANEL
CHOL/HDL RATIO: 5.5 ratio
Cholesterol: 226 mg/dL — ABNORMAL HIGH (ref 0–200)
HDL: 41 mg/dL (ref >39)
LDL CALC: 124 mg/dL — AB (ref 0–99)
Triglycerides: 303 mg/dL — ABNORMAL HIGH (ref <150)
VLDL: 61 mg/dL — ABNORMAL HIGH (ref 0–40)

## 2013-12-02 LAB — HEMOGLOBIN A1C
HEMOGLOBIN A1C: 8.1 % — AB (ref <5.7)
Mean Plasma Glucose: 186 mg/dL — ABNORMAL HIGH (ref <117)

## 2013-12-02 LAB — PHOSPHORUS: Phosphorus: 2.9 mg/dL (ref 2.3–4.6)

## 2013-12-02 LAB — URIC ACID (CC13): Uric Acid, Serum: 3.2 mg/dl (ref 2.6–7.4)

## 2013-12-02 MED ORDER — INV-EVEROLIMUS (RAD0001) 5MG TABLET NOVARTIS CRAD001Y24135: 2.0000 | ORAL_TABLET | Freq: Every day | ORAL | Status: DC

## 2013-12-02 NOTE — Telephone Encounter (Signed)
per pof to sch appt/gave copy of sch to pt

## 2013-12-02 NOTE — Progress Notes (Signed)
12/02/13 at 10:47am - [employer], Bolero 4 - cycle 29, day 1 study notes- The pt was into the cancer center this am for her fasting labs and cycle 29 assessments.  The pt confirmed that she was fasting upon arrival to the cancer center for her labs.  The pt returned her cycle 28 everolimus study drug box.  She confirmed that she took her everolimus as prescribed (10 mg) everyday along with her letrozole (2.5 mg) from 11/04/13 through 12/01/13 (28 days).  The pt's everolimus drug box had 16 remaining, unopened blister packs. The pt was dispensed 70 pills - 54 pills (taken, opened blister packs) = 16 returned.   The research nurse asked the pt that she usually returns 14 pills, but today she returned 16.  The nurse asked the pt is she had missed a day.  The pt said that she thought she took her pills everyday, but she must have "missed a day sometime".   The pt was unsure as to the date of her missed pills.  The pt was very concerned about her drug accountability.  The research nurse reassured the pt that she is very compliant and missing her pills 1 day will not affect her participation.  The pt's everolimus drug box was taken to the pharmacy for the final drug accountability check and storage.  The pt was seen and examined this morning by Charlestine Massed, NP (Dr. Laurelyn Sickle advanced practice provider).  The NP reviewed the pt's labs, and she felt the pt's lab abnormals were "not clinically significant".  The pt's glucose value was down to 154 (161 last visit).  The pt said that she has been exercising more and eating a more balanced diet.  The pt will continue to work on her diet and exercise.  The pt said that she has realized that if her dog sits on her lap then her hip hurts more.  The pt denies any new or worsening AE's.  She confirmed the following AE's as ongoing:  hot flashes, memory impairment, intermittent nausea and diarrhea, arthritis in her hands, bilateral cataracts, fatigue, and knee pain.  The pt's concomitant  medications were reviewed with the pt.  The pt denied any new medications or changes to her current medications.  The pt vitals and weight are stable.  She is performing all of her usual activities with some limitations.  ECOG =1.  The pt is aware of her June appointments.  The pt was dispensed her everolimus drug box for self administration for cycle 29.

## 2013-12-02 NOTE — Progress Notes (Signed)
Hematology and Oncology Follow Up Visit  Barbara Parks [id number] [birth year] 63 y.o. 12/02/2013 8:46 AM     Principle Diagnosis:Barbara Parks 63 y.o. female with metastatic breast cancer to the lung and bone.   Prior Therapy:  #1 patient originally presented when she was 63 years old with early stage breast cancer in 2005. At that time she underwent a lumpectomy with axillary lymph node dissection. She was followed by Dr. Eston Esters and Collier Salina young. Her last visit to Dr. Julien Girt office was in 2006. Because of difficulties with insurance she had not been receiving mammograms.   #2 in 2012 patient began having shortness of breath on exertion. She was seen by pulmonary service and a CT scan showed some minor mediastinal adenopathy as well as borderline lower lobe nodule. PET scan was not performed. October 2012 patient began to experience more shortness of breath. Initially she was told that she had pneumonia. However subsequently she developed lower extremity edema. CT of the chest was obtained that showed a large pleural effusion in the right side as well as pericardial effusion. She was seen by cardiothoracic surgery placed in 09/11/2011. The cytology revealed that patient indeed have recurrent breast cancer that was hormone receptor positive HER-2/neu was not obtained as it was insufficient tissue. She subsequently had a CT scan performed that showed 2 nodules in the lung suspicious for metastatic disease as well as left lower lobe collapse. She had a biopsy of the lungs performed on 09/11/2011 the tumor was estrogen receptor +91% progesterone receptor +100% proliferation marker Ki-67 35%.   #3 patient was seen by Dr. Eston Esters and he recommended that she be enrolled on the BOLERO-4 clinical trial utilizing letrozole 2.5 mg per day and affinitor10 mg per day. She initiated this on 10/10/2011.  she has had a significant response to this.  Current therapy:  BOLERO -4 with everolimus 10 mg and  letrozole 2.5 mg daily. Patient will begin cycle 29 day 1 today. ( 28 day cycles)  Interim History: Barbara Parks 63 y.o. female with metastatic breast cancer here for her monthly evaluation on the BOLERO-4 clinical trial.  She is doing moderately well today. The pain in her left hip that goes down her knee is much improved.  She has figured out that it has come from her dog lying on her lap.  He hasn't done it since and it is resolved.  She is otherwise doing well today.  She has about two hot flashes per day and these are tolerable.  She has had a mildly elevated blood glucose and has started the lifestyle modifications we discussed.  She is following a diabetic diet and tells me that she only had to make a few changes in the way she eats.  She denies fevers, chills, nausea, vomiting, constipation, diarrhea, any new pain, joint aches, dryness, or any further concerns.     Medications:  Current Outpatient Prescriptions  Medication Sig Dispense Refill  . cholecalciferol (VITAMIN D) 1000 UNITS tablet Take 2,000 Units by mouth daily.       Marland Kitchen gemfibrozil (LOPID) 600 MG tablet Take 1 tablet (600 mg total) by mouth 2 (two) times daily before a meal.  60 tablet  1  . ibuprofen (ADVIL,MOTRIN) 200 MG tablet Take 400 mg by mouth every 6 (six) hours as needed. For pain      . Investigational everolimus (RAD001) 5 MG tablet [employer] [id number] Take 2 tablets by mouth daily. Take with a glass of water.  70 tablet  0  . Investigational everolimus (RAD001) 5 MG tablet [employer] [id number] Take 2 tablets by mouth daily. Take with a glass of water.  70 tablet  0  . Investigational everolimus (RAD001) 5 MG tablet [employer] [id number] Take 2 tablets by mouth daily. Take with a glass of water.  70 tablet  0  . letrozole (FEMARA) 2.5 MG tablet Take 1 tablet (2.5 mg total) by mouth daily.  90 tablet  6  . loperamide (IMODIUM) 2 MG capsule Take 2 mg by mouth 4 (four) times daily as needed for diarrhea or loose  stools.      . metoprolol tartrate (LOPRESSOR) 25 MG tablet Take 0.5 tablets (12.5 mg total) by mouth daily.  30 tablet  4  . prochlorperazine (COMPAZINE) 10 MG tablet Take 10 mg by mouth every 6 (six) hours as needed. For nausea       No current facility-administered medications for this visit.     Allergies:  Allergies  Allergen Reactions  . Aspirin     REACTION: upset stomach  . Latex Other (See Comments)    Blistering and skin peels off  . Nsaids Nausea And Vomiting    Extreme nausea and vomiting    Medical History: Past Medical History  Diagnosis Date  . Asthma   . Shortness of breath   . Headache(784.0)     OTC Prilosec  . Arthritis     left hip  . Contact dermatitis     from a bandaid that had Latex   . breast ca 2005    breast/chemo R mastectomy  . Metastasis to lung dx'd 08/2011  . Osteopenia due to cancer therapy 09/09/2013    Surgical History:  Past Surgical History  Procedure Laterality Date  . Spine surgery  1996  . Breast surgery  2005    right  . Video bronchoscopy  09/11/2011    Procedure: VIDEO BRONCHOSCOPY;  Surgeon: Gaye Pollack, MD;  Location: [facility];  Service: Thoracic;  Laterality: N/A;  . Chest tube insertion  09/11/2011    Procedure: INSERTION PLEURAL DRAINAGE CATHETER;  Surgeon: Gaye Pollack, MD;  Location: [facility];  Service: Thoracic;  Laterality: Right;  . Pericardial window  09/11/2011    Procedure: PERICARDIAL WINDOW;  Surgeon: Gaye Pollack, MD;  Location: [facility];  Service: Thoracic;  Laterality: N/A;  . Removal of pleural drainage catheter  12/19/2011    Procedure: REMOVAL OF PLEURAL DRAINAGE CATHETER;  Surgeon: Gaye Pollack, MD;  Location: [facility];  Service: Thoracic;  Laterality: Right;  TO BE DONE IN MINOR ROOM, SHORT STAY     Review of Systems: A 10 point review of systems was conducted and is otherwise negative except for what is noted above.     Physical Exam: Blood pressure 120/78, pulse 94, temperature 97.8 F (36.6 C),  temperature source Oral, resp. rate 20, height 5' 5"  (1.651 m), weight 181 lb 3.2 oz (82.192 kg). GENERAL: Patient is a well appearing female in no acute distress HEENT:  Sclerae anicteric.  Oropharynx clear and moist. No ulcerations or evidence of oropharyngeal candidiasis. Neck is supple.  NODES:  No cervical, supraclavicular, or axillary lymphadenopathy palpated.  BREAST EXAM:  Deferred. LUNGS:  Clear to auscultation bilaterally.  No wheezes or rhonchi. HEART:  Regular rate and rhythm. No murmur appreciated. ABDOMEN:  Soft, nontender.  Positive, normoactive bowel sounds. No organomegaly palpated. MSK:  No focal spinal tenderness to palpation. Full range of motion bilaterally in the  upper extremities. EXTREMITIES:  No peripheral edema.   SKIN:  Clear with no obvious rashes or skin changes. No nail dyscrasia. NEURO:  Nonfocal. Well oriented.  Appropriate affect. ECOG PERFORMANCE STATUS: 1 - Symptomatic but completely ambulatory   Lab Results: Lab Results  Component Value Date   WBC 6.8 12/02/2013   HGB 13.4 12/02/2013   HCT 40.1 12/02/2013   MCV 77.5* 12/02/2013   PLT 293 12/02/2013     Chemistry      Component Value Date/Time   NA 141 11/04/2013 0850   NA 136 07/16/2012 0910   K 4.0 11/04/2013 0850   K 4.0 07/16/2012 0910   CL 109* 12/31/2012 0940   CL 103 07/16/2012 0910   CO2 21* 11/04/2013 0850   CO2 21 07/16/2012 0910   BUN 20.1 11/04/2013 0850   BUN 16 07/16/2012 0910   CREATININE 0.9 11/04/2013 0850   CREATININE 0.75 07/16/2012 0910      Component Value Date/Time   CALCIUM 10.3 11/04/2013 0850   CALCIUM 10.1 07/16/2012 0910   ALKPHOS 225* 11/04/2013 0850   ALKPHOS 142* 07/16/2012 0910   AST 35* 11/04/2013 0850   AST 30 07/16/2012 0910   ALT 31 11/04/2013 0850   ALT 33 07/16/2012 0910   BILITOT 0.30 11/04/2013 0850   BILITOT 0.2* 07/16/2012 0910       Radiological Studies: CLINICAL DATA: Recurrent right breast cancer, status post right  mastectomy, intravenous chemotherapy complete,  oral chemotherapy  ongoing  EXAM:  CT CHEST, ABDOMEN, AND PELVIS WITH CONTRAST  TECHNIQUE:  Multidetector CT imaging of the chest, abdomen and pelvis was  performed following the standard protocol during bolus  administration of intravenous contrast.  CONTRAST: 138m OMNIPAQUE IOHEXOL 300 MG/ML SOLN  COMPARISON: 10/05/2013  RECIST 1.0  Target Lesions:  1. Right lower lobe lung nodule measures 6 mm (series 5/ image 40),  unchanged  2. Subcarinal lymph node measures 7 mm short axis (series 2/image  29), unchanged  3. Right hilar lymph node measures 5 mm short axis (series 2/ image  27), previously 7 mm  Non-target Lesions:  1. Small right pleural effusion has resolved  2. Superior segment right lower lobe lung nodule is not visualized  FINDINGS:  CT CHEST FINDINGS  Mild right lower lobe scarring with associated 6 mm subpleural  nodule (series 5/ image 40), unchanged. Faint subpleural nodularity  in the lateral right lung apex (series 5/image 8), likely post  infectious/inflammatory. No new/suspicious pulmonary nodules. No  pleural effusion or pneumothorax.  Visualized thyroid is unremarkable.  The heart is top-normal in size. No pericardial effusion. Mild  atherosclerotic calcifications of the aortic arch.  Small thoracic lymph nodes, within normal limits, including:  --6 mm short axis right supraclavicular node (series 2/ image 3),  unchanged  --7 mm short axis subcarinal node (series 2/ image 29), partially  calcified, unchanged  --5 mm short axis right hilar node (series 2/ image 27), previously  7 mm  Status post right mastectomy with right axillary lymph node  dissection.  Degenerative changes of the thoracic spine.  CT ABDOMEN AND PELVIS FINDINGS  Liver is notable for moderate hepatic steatosis. Ovoid hypervascular  lesion in the posterior segment right hepatic dome (series 2/image  24), possibly reflecting a perfusion anomaly or focal fatty sparing,  unchanged. Focal  fatty sparing along the gallbladder fossa.  Spleen, pancreas,, and adrenal glands are within normal limits.  Suspected gallbladder adenomyomatosis. No intrahepatic or  extrahepatic ductal dilatation.  Kidneys are within  normal limits. No hydronephrosis.  No evidence of bowel obstruction. Normal appendix. Colonic  diverticulosis, without associated inflammatory changes.  Atherosclerotic calcifications of the abdominal aorta and branch  vessels.  No abdominopelvic ascites.  No suspicious abdominopelvic lymphadenopathy.  Uterus and bilateral ovaries are unremarkable.  Bladder is within normal limits.  Degenerative changes involving the lumbar spine, most prominent at  L3-4. Degenerative changes involving the left hip.  Suspected sclerotic metastases involving the left iliac wing (series  2/image 22), left iliac bone/ acetabulum (series 2/image 98), and  left sacrum (series 2/image 25), unchanged.  IMPRESSION:  Status post right mastectomy and right axillary lymph node  dissection.  Stable sclerotic osseous metastases in the pelvis, as described  above.  No evidence of new/progressive metastatic disease in the chest,  abdomen, or pelvis.  RECIST 1.0 measurements as above.  Electronically Signed  By: Julian Hy M.D.  On: 11/30/2013 16:06  Assessment and Plan: NICOLA HEINEMANN 64 y.o. female with  1. Metastatic breast cancer to the lung and bone.  See previous history above.  The patient is on the BOLERO-4 clinical trial with Everolimus 29m daily and Letrozole 2.565mdaily on a 28 day cycle.  She continues to tolerate this medication very well and is doing well.  I reviewed her scans above with her along with NiDoristine JohnsRN from research.  They are stable to slightly improved.  She will proceed with cycle 29 of the BOLERO-4 trial.    2. Osteopenia/sclerotic bone lesions.  Patient has discussed bisphosphanate therapy.  She has been evaluated by a dentist and has been evaluated by an  oral surgeon for a tooth that may need to be removed.  We will await this before initiating therapy.    3. Hyperglycemia.  The patient has a fasting blood sugar of 154 today. We discussed her hyperglycemia.  She has changed to a diabetic diet.  She does have longstanding problems with her hip, so we discussed water aerobics and swimming as an exercise that she would be able to do that would be easier on her joints.    The patient will return on 12/30/13 for labs and evaluation.  She knows to call usKorean the interim for any questions or concerns.  We can certainly see her sooner if needed.    I spent 25 minutes counseling the patient face to face.  The total time spent in the appointment was 30 minutes.  LiMinette [phone]/01/2014 8:46 AM

## 2013-12-14 ENCOUNTER — Other Ambulatory Visit: Payer: Self-pay | Admitting: Oncology

## 2013-12-29 ENCOUNTER — Telehealth: Payer: Self-pay | Admitting: Oncology

## 2013-12-29 ENCOUNTER — Other Ambulatory Visit: Payer: Self-pay | Admitting: *Deleted

## 2013-12-29 DIAGNOSIS — C50919 Malignant neoplasm of unspecified site of unspecified female breast: Secondary | ICD-10-CM

## 2013-12-29 NOTE — Telephone Encounter (Signed)
Pt to pick up her schedule tomorrow when she comes in for her appt.

## 2013-12-30 ENCOUNTER — Encounter: Payer: Self-pay | Admitting: Adult Health

## 2013-12-30 ENCOUNTER — Encounter: Payer: Self-pay | Admitting: Oncology

## 2013-12-30 ENCOUNTER — Other Ambulatory Visit (HOSPITAL_BASED_OUTPATIENT_CLINIC_OR_DEPARTMENT_OTHER): Payer: Self-pay

## 2013-12-30 ENCOUNTER — Encounter: Payer: Self-pay | Admitting: *Deleted

## 2013-12-30 ENCOUNTER — Other Ambulatory Visit: Payer: Self-pay | Admitting: Oncology

## 2013-12-30 ENCOUNTER — Ambulatory Visit (HOSPITAL_BASED_OUTPATIENT_CLINIC_OR_DEPARTMENT_OTHER): Payer: Self-pay | Admitting: Adult Health

## 2013-12-30 VITALS — BP 117/78 | HR 96 | Temp 98.1°F | Resp 18 | Ht 65.0 in | Wt 177.1 lb

## 2013-12-30 DIAGNOSIS — C50919 Malignant neoplasm of unspecified site of unspecified female breast: Secondary | ICD-10-CM

## 2013-12-30 DIAGNOSIS — M25559 Pain in unspecified hip: Secondary | ICD-10-CM

## 2013-12-30 DIAGNOSIS — C7800 Secondary malignant neoplasm of unspecified lung: Secondary | ICD-10-CM

## 2013-12-30 DIAGNOSIS — C7952 Secondary malignant neoplasm of bone marrow: Secondary | ICD-10-CM

## 2013-12-30 DIAGNOSIS — C7951 Secondary malignant neoplasm of bone: Secondary | ICD-10-CM

## 2013-12-30 DIAGNOSIS — M949 Disorder of cartilage, unspecified: Secondary | ICD-10-CM

## 2013-12-30 DIAGNOSIS — C50419 Malignant neoplasm of upper-outer quadrant of unspecified female breast: Secondary | ICD-10-CM

## 2013-12-30 DIAGNOSIS — M899 Disorder of bone, unspecified: Secondary | ICD-10-CM

## 2013-12-30 LAB — CBC WITH DIFFERENTIAL/PLATELET
BASO%: 0.7 % (ref 0.0–2.0)
Basophils Absolute: 0.1 10*3/uL (ref 0.0–0.1)
EOS%: 2.2 % (ref 0.0–7.0)
Eosinophils Absolute: 0.2 10*3/uL (ref 0.0–0.5)
HCT: 42.3 % (ref 34.8–46.6)
HGB: 14.2 g/dL (ref 11.6–15.9)
LYMPH#: 0.9 10*3/uL (ref 0.9–3.3)
LYMPH%: 11.3 % — ABNORMAL LOW (ref 14.0–49.7)
MCH: 26.3 pg (ref 25.1–34.0)
MCHC: 33.5 g/dL (ref 31.5–36.0)
MCV: 78.7 fL — ABNORMAL LOW (ref 79.5–101.0)
MONO#: 0.8 10*3/uL (ref 0.1–0.9)
MONO%: 9.9 % (ref 0.0–14.0)
NEUT#: 6.1 10*3/uL (ref 1.5–6.5)
NEUT%: 75.9 % (ref 38.4–76.8)
Platelets: 278 10*3/uL (ref 145–400)
RBC: 5.38 10*6/uL (ref 3.70–5.45)
RDW: 13.9 % (ref 11.2–14.5)
WBC: 8 10*3/uL (ref 3.9–10.3)

## 2013-12-30 LAB — COMPREHENSIVE METABOLIC PANEL (CC13)
ALK PHOS: 243 U/L — AB (ref 40–150)
ALT: 44 U/L (ref 0–55)
AST: 40 U/L — AB (ref 5–34)
Albumin: 3.6 g/dL (ref 3.5–5.0)
Anion Gap: 15 mEq/L — ABNORMAL HIGH (ref 3–11)
BUN: 18 mg/dL (ref 7.0–26.0)
CALCIUM: 10.2 mg/dL (ref 8.4–10.4)
CHLORIDE: 110 meq/L — AB (ref 98–109)
CO2: 17 mEq/L — ABNORMAL LOW (ref 22–29)
Creatinine: 0.9 mg/dL (ref 0.6–1.1)
Glucose: 144 mg/dl — ABNORMAL HIGH (ref 70–140)
POTASSIUM: 3.7 meq/L (ref 3.5–5.1)
Sodium: 142 mEq/L (ref 136–145)
Total Bilirubin: 0.34 mg/dL (ref 0.20–1.20)
Total Protein: 8.2 g/dL (ref 6.4–8.3)

## 2013-12-30 LAB — LIPID PANEL
Cholesterol: 244 mg/dL — ABNORMAL HIGH (ref 0–200)
HDL: 49 mg/dL (ref >39)
LDL CALC: 145 mg/dL — AB (ref 0–99)
TRIGLYCERIDES: 249 mg/dL — AB (ref <150)
Total CHOL/HDL Ratio: 5 Ratio
VLDL: 50 mg/dL — ABNORMAL HIGH (ref 0–40)

## 2013-12-30 LAB — URIC ACID (CC13): Uric Acid, Serum: 3.2 mg/dl (ref 2.6–7.4)

## 2013-12-30 LAB — CK: Total CK: 65 U/L (ref 7–177)

## 2013-12-30 LAB — BILIRUBIN, DIRECT

## 2013-12-30 LAB — PHOSPHORUS: Phosphorus: 3.1 mg/dL (ref 2.3–4.6)

## 2013-12-30 MED ORDER — INV-EVEROLIMUS (RAD0001) 5MG TABLET NOVARTIS CRAD001Y24135: 2.0000 | ORAL_TABLET | Freq: Every day | ORAL | Status: DC

## 2013-12-30 NOTE — Progress Notes (Signed)
12/30/13 at 11:41am - [employer], Bolero 4 cycle 30, day 1 study notes- The pt was into the cancer center this am for her fasting labs and cycle 30 assessments.  The pt confirmed that she was fasting upon arrival to the cancer center for her labs.  The pt returned her cycle 29 everolimus study drug box. She confirmed that she took her everolimus (10mg ) everyday along with her letrozole (2.5mg ) from 12/02/13 through 12/29/13 (28 days).  The pt's everolimus drug box had 14 remaining (unopened) blister packs.  The pt was dispensed 70 pills - 56 taken (28 days x 2 doses= 56 opened blister packs) = 14 returned.  Therefore, the pt is 100% compliant with her study drug, everolimus, for cycle 29.  The pt's everolimus was taken to the pharmacy for the drug accountability check and storage.  The pt was seen and examined today by Charlestine Massed, NP.  The pt was informed that Dr. Humphrey Rolls will not returned to her practice.  The pt was told that Dr. Jana Hakim will be her physician.  The pt was saddened that she will have a new physician, but she was grateful that her research nurse has remained constant during her care.  The NP reviewed the pt's labs, and she felt that the pt's "lab abnormals" were not clinically significant.  The NP congratulated the pt for losing weight and making some dietary changes.  However, the pt's fasting glucose still remains elevated (hyperglycemia ongoing).  The pt was encouraged to keep trying to modify her diet and lose weight.  The pt was told that Dr. Jana Hakim might add a new medication, metformin, to help lower her fasting glucose in the near future.  The pt said that she did not want to take anymore medications.  The pt was informed that her hemoglobin A1C was elevated.  The pt stated that she would take any medication that was prescribed.  The pt said that her hip pain is stable.  She said that she is discouraging her dog from lying on her legs which increases her hip pain.  The pt's hip pain is related  to her advanced osteoarthritis which was reported at baseline.  The pt states the following AE's are ongoing:  hot flashes, memory impairment, intermittent nausea and diarrhea, arthritis in hands, bilateral cataracts, fatigue, and bilateral knee pain. The pt said that yogurt has lessened her intermittent nausea and diarrhea.  The pt's vitals are stable.  The pt's current concomitant medications were reviewed with the pt.  The pt denies any new medications.  She states the following medications as ongoing: lopressor, compazine prn, immodium prn, lopid, ibuprofen prn, and Vitamin D3.  She is performing all of her usual activities.  She reports that she has to take frequent rest breaks.  ECOG=1.  The pt is aware of her appointments for June and July.  The pt was dispensed her everolimus drug box for self administration for cycle 30.

## 2013-12-30 NOTE — Progress Notes (Signed)
Checked in pt with no insurance.  Pt has applied for Medicaid is awaiting approval.  I gave pt a financial application to apply for assistance thru the hospital if Medicaid denied her.  She also stated she can apply for Medicare on January 26, 2014.

## 2013-12-30 NOTE — Patient Instructions (Signed)
Metformin tablets What is this medicine? METFORMIN (met FOR min) is used to treat type 2 diabetes. It helps to control blood sugar. Treatment is combined with diet and exercise. This medicine can be used alone or with other medicines for diabetes. This medicine may be used for other purposes; ask your health care provider or pharmacist if you have questions. COMMON BRAND NAME(S): Glucophage What should I tell my health care provider before I take this medicine? They need to know if you have any of these conditions: -anemia -frequently drink alcohol-containing beverages -become easily dehydrated -heart attack -heart failure that is treated with medications -kidney disease -liver disease -polycystic ovary syndrome -serious infection or injury -vomiting -an unusual or allergic reaction to metformin, other medicines, foods, dyes, or preservatives -pregnant or trying to get pregnant -breast-feeding How should I use this medicine? Take this medicine by mouth. Take it with meals. Swallow the tablets with a drink of water. Follow the directions on the prescription label. Take your medicine at regular intervals. Do not take your medicine more often than directed. Talk to your pediatrician regarding the use of this medicine in children. While this drug may be prescribed for children as young as 10 years of age for selected conditions, precautions do apply. Overdosage: If you think you have taken too much of this medicine contact a poison control center or emergency room at once. NOTE: This medicine is only for you. Do not share this medicine with others. What if I miss a dose? If you miss a dose, take it as soon as you can. If it is almost time for your next dose, take only that dose. Do not take double or extra doses. What may interact with this medicine? Do not take this medicine with any of the following medications: -dofetilide -gatifloxacin -certain contrast medicines given before X-rays,  CT scans, MRI, or other procedures This medicine may also interact with the following medications: -digoxin -diuretics -female hormones, like estrogens or progestins and birth control pills -isoniazid -medicines for blood pressure, heart disease, irregular heart beat -morphine -nicotinic acid -phenothiazines like chlorpromazine, mesoridazine, prochlorperazine, thioridazine -phenytoin -procainamide -quinidine -quinine -ranitidine -steroid medicines like prednisone or cortisone -stimulant medicines for attention disorders, weight loss, or to stay awake -thyroid medicines -trimethoprim -vancomycin This list may not describe all possible interactions. Give your health care provider a list of all the medicines, herbs, non-prescription drugs, or dietary supplements you use. Also tell them if you smoke, drink alcohol, or use illegal drugs. Some items may interact with your medicine. What should I watch for while using this medicine? Visit your doctor or health care professional for regular checks on your progress. A test called the HbA1C (A1C) will be monitored. This is a simple blood test. It measures your blood sugar control over the last 2 to 3 months. You will receive this test every 3 to 6 months. Learn how to check your blood sugar. Learn the symptoms of low and high blood sugar and how to manage them. Always carry a quick-source of sugar with you in case you have symptoms of low blood sugar. Examples include hard sugar candy or glucose tablets. Make sure others know that you can choke if you eat or drink when you develop serious symptoms of low blood sugar, such as seizures or unconsciousness. They must get medical help at once. Tell your doctor or health care professional if you have high blood sugar. You might need to change the dose of your medicine. If you are   sick or exercising more than usual, you might need to change the dose of your medicine. Do not skip meals. Ask your doctor or  health care professional if you should avoid alcohol. Many nonprescription cough and cold products contain sugar or alcohol. These can affect blood sugar. This medicine may cause ovulation in premenopausal women who do not have regular monthly periods. This may increase your chances of becoming pregnant. You should not take this medicine if you become pregnant or think you may be pregnant. Talk with your doctor or health care professional about your birth control options while taking this medicine. Contact your doctor or health care professional right away if think you are pregnant. If you are going to need surgery, a MRI, CT scan, or other procedure, tell your doctor that you are taking this medicine. You may need to stop taking this medicine before the procedure. Wear a medical ID bracelet or chain, and carry a card that describes your disease and details of your medicine and dosage times. What side effects may I notice from receiving this medicine? Side effects that you should report to your doctor or health care professional as soon as possible: -allergic reactions like skin rash, itching or hives, swelling of the face, lips, or tongue -breathing problems -feeling faint or lightheaded, falls -muscle aches or pains -signs and symptoms of low blood sugar such as feeling anxious, confusion, dizziness, increased hunger, unusually weak or tired, sweating, shakiness, cold, irritable, headache, blurred vision, fast heartbeat, loss of consciousness -slow or irregular heartbeat -unusual stomach pain or discomfort -unusually tired or weak Side effects that usually do not require medical attention (report to your doctor or health care professional if they continue or are bothersome): -diarrhea -headache -heartburn -metallic taste in mouth -nausea -stomach gas, upset This list may not describe all possible side effects. Call your doctor for medical advice about side effects. You may report side effects  to FDA at 1-800-FDA-1088. Where should I keep my medicine? Keep out of the reach of children. Store at room temperature between 15 and 30 degrees C (59 and 86 degrees F). Protect from moisture and light. Throw away any unused medicine after the expiration date. NOTE: This sheet is a summary. It may not cover all possible information. If you have questions about this medicine, talk to your doctor, pharmacist, or health care provider.  2014, [employer] Standard. (2012-10-27 16:03:44)  

## 2013-12-30 NOTE — Progress Notes (Signed)
Hematology and Oncology Follow Up Visit  Barbara Parks [id number] February 15, 1951 63 y.o. 01/03/2014 2:22 PM     Principal Diagnosis:Barbara Parks 63 y.o. female with metastatic breast cancer to the lung and bone.   Prior Therapy:  #1 patient originally presented when she was 63 years old with early stage breast cancer in 2005. At that time she underwent a lumpectomy with axillary lymph node dissection. She was followed by Dr. Eston Esters and Collier Salina young. Her last visit to Dr. Julien Girt office was in 2006. Because of difficulties with insurance she had not been receiving mammograms.   #2 in 2012 patient began having shortness of breath on exertion. She was seen by pulmonary service and a CT scan showed some minor mediastinal adenopathy as well as borderline lower lobe nodule. PET scan was not performed. October 2012 patient began to experience more shortness of breath. Initially she was told that she had pneumonia. However subsequently she developed lower extremity edema. CT of the chest was obtained that showed a large pleural effusion in the right side as well as pericardial effusion. She was seen by cardiothoracic surgery placed in 09/11/2011. The cytology revealed that patient indeed have recurrent breast cancer that was hormone receptor positive HER-2/neu was not obtained as it was insufficient tissue. She subsequently had a CT scan performed that showed 2 nodules in the lung suspicious for metastatic disease as well as left lower lobe collapse. She had a biopsy of the lungs performed on 09/11/2011 the tumor was estrogen receptor +91% progesterone receptor +100% proliferation marker Ki-67 35%.   #3 patient was seen by Dr. Eston Esters and he recommended that she be enrolled on the BOLERO-4 clinical trial utilizing letrozole 2.5 mg per day and affinitor10 mg per day. She initiated this on 10/10/2011.  she has had a significant response to this.  Current therapy:  BOLERO -4 with everolimus 10 mg and  letrozole 2.5 mg daily. Patient will begin cycle 30 day 1 today. ( 28 day cycles)  Interim History: Barbara Parks 64 y.o. female with metastatic breast cancer here for her monthly evaluation on the BOLERO-4 clinical trial.  She is doing moderately well today. She continues to have mild pain in her hip that is controlled.  Her dog is not lying in her lap and her pain has improved slightly.  She does have mild diarrhea, and she has been eating increased amounts of yogurt and the diarrhea has improved.  She has been doing her lifestyle modifications, and she is exercising when possible.  She has lost 4 pounds since her last appointment.    Medications:  Current Outpatient Prescriptions  Medication Sig Dispense Refill  . cholecalciferol (VITAMIN D) 1000 UNITS tablet Take 2,000 Units by mouth daily.       Marland Kitchen gemfibrozil (LOPID) 600 MG tablet TAKE ONE TABLET BY MOUTH TWICE DAILY BEFORE  A  MEAL  90 tablet  0  . Investigational everolimus (RAD001) 5 MG tablet Novartis [id number] Take 2 tablets by mouth daily. Take with a glass of water.  70 tablet  0  . letrozole (FEMARA) 2.5 MG tablet TAKE ONE TABLET BY MOUTH ONCE DAILY  90 tablet  0  . metoprolol tartrate (LOPRESSOR) 25 MG tablet Take 0.5 tablets (12.5 mg total) by mouth daily.  30 tablet  4  . ibuprofen (ADVIL,MOTRIN) 200 MG tablet Take 400 mg by mouth every 6 (six) hours as needed. For pain      . Investigational everolimus (RAD001) 5 MG  tablet Novartis [id number] Take 2 tablets by mouth daily. Take with a glass of water.  70 tablet  0  . loperamide (IMODIUM) 2 MG capsule Take 2 mg by mouth 4 (four) times daily as needed for diarrhea or loose stools.      . prochlorperazine (COMPAZINE) 10 MG tablet Take 10 mg by mouth every 6 (six) hours as needed. For nausea       No current facility-administered medications for this visit.     Allergies:  Allergies  Allergen Reactions  . Aspirin     REACTION: upset stomach  . Latex Other (See Comments)     Blistering and skin peels off  . Nsaids Nausea And Vomiting    Extreme nausea and vomiting    Medical History: Past Medical History  Diagnosis Date  . Asthma   . Shortness of breath   . Headache(784.0)     OTC Prilosec  . Arthritis     left hip  . Contact dermatitis     from a bandaid that had Latex   . breast ca 2005    breast/chemo R mastectomy  . Metastasis to lung dx'd 08/2011  . Osteopenia due to cancer therapy 09/09/2013    Surgical History:  Past Surgical History  Procedure Laterality Date  . Spine surgery  1996  . Breast surgery  2005    right  . Video bronchoscopy  09/11/2011    Procedure: VIDEO BRONCHOSCOPY;  Surgeon: Gaye Pollack, MD;  Location: [place];  Service: Thoracic;  Laterality: N/A;  . Chest tube insertion  09/11/2011    Procedure: INSERTION PLEURAL DRAINAGE CATHETER;  Surgeon: Gaye Pollack, MD;  Location: [place];  Service: Thoracic;  Laterality: Right;  . Pericardial window  09/11/2011    Procedure: PERICARDIAL WINDOW;  Surgeon: Gaye Pollack, MD;  Location: [place];  Service: Thoracic;  Laterality: N/A;  . Removal of pleural drainage catheter  12/19/2011    Procedure: REMOVAL OF PLEURAL DRAINAGE CATHETER;  Surgeon: Gaye Pollack, MD;  Location: [place];  Service: Thoracic;  Laterality: Right;  TO BE DONE IN MINOR ROOM, SHORT STAY     Review of Systems: A 10 point review of systems was conducted and is otherwise negative except for what is noted above.     Physical Exam: Blood pressure 117/78, pulse 96, temperature 98.1 F (36.7 C), temperature source Oral, resp. rate 18, height 5' 5"  (1.651 m), weight 177 lb 1.6 oz (80.332 kg). GENERAL: Patient is a well appearing female in no acute distress HEENT:  Sclerae anicteric.  Oropharynx clear and moist. No ulcerations or evidence of oropharyngeal candidiasis. Neck is supple.  NODES:  No cervical, supraclavicular, or axillary lymphadenopathy palpated.  BREAST EXAM:  Deferred. LUNGS:  Clear to auscultation  bilaterally.  No wheezes or rhonchi. HEART:  Regular rate and rhythm. No murmur appreciated. ABDOMEN:  Soft, nontender.  Positive, normoactive bowel sounds. No organomegaly palpated. MSK:  No focal spinal tenderness to palpation. Full range of motion bilaterally in the upper extremities. EXTREMITIES:  No peripheral edema.   SKIN:  Clear with no obvious rashes or skin changes. No nail dyscrasia. NEURO:  Nonfocal. Well oriented.  Appropriate affect. ECOG PERFORMANCE STATUS: 1 - Symptomatic but completely ambulatory   Lab Results: Lab Results  Component Value Date   WBC 8.0 12/30/2013   HGB 14.2 12/30/2013   HCT 42.3 12/30/2013   MCV 78.7* 12/30/2013   PLT 278 12/30/2013     Chemistry  Component Value Date/Time   NA 142 12/30/2013 0903   NA 136 07/16/2012 0910   K 3.7 12/30/2013 0903   K 4.0 07/16/2012 0910   CL 109* 12/31/2012 0940   CL 103 07/16/2012 0910   CO2 17* 12/30/2013 0903   CO2 21 07/16/2012 0910   BUN 18.0 12/30/2013 0903   BUN 16 07/16/2012 0910   CREATININE 0.9 12/30/2013 0903   CREATININE 0.75 07/16/2012 0910      Component Value Date/Time   CALCIUM 10.2 12/30/2013 0903   CALCIUM 10.1 07/16/2012 0910   ALKPHOS 243* 12/30/2013 0903   ALKPHOS 142* 07/16/2012 0910   AST 40* 12/30/2013 0903   AST 30 07/16/2012 0910   ALT 44 12/30/2013 0903   ALT 33 07/16/2012 0910   BILITOT 0.34 12/30/2013 0903   BILITOT 0.2* 07/16/2012 0910       Radiological Studies: CLINICAL DATA: Recurrent right breast cancer, status post right  mastectomy, intravenous chemotherapy complete, oral chemotherapy  ongoing  EXAM:  CT CHEST, ABDOMEN, AND PELVIS WITH CONTRAST  TECHNIQUE:  Multidetector CT imaging of the chest, abdomen and pelvis was  performed following the standard protocol during bolus  administration of intravenous contrast.  CONTRAST: 172m OMNIPAQUE IOHEXOL 300 MG/ML SOLN  COMPARISON: 10/05/2013  RECIST 1.0  Target Lesions:  1. Right lower lobe lung nodule measures 6 mm (series 5/ image  40),  unchanged  2. Subcarinal lymph node measures 7 mm short axis (series 2/image  29), unchanged  3. Right hilar lymph node measures 5 mm short axis (series 2/ image  27), previously 7 mm  Non-target Lesions:  1. Small right pleural effusion has resolved  2. Superior segment right lower lobe lung nodule is not visualized  FINDINGS:  CT CHEST FINDINGS  Mild right lower lobe scarring with associated 6 mm subpleural  nodule (series 5/ image 40), unchanged. Faint subpleural nodularity  in the lateral right lung apex (series 5/image 8), likely post  infectious/inflammatory. No new/suspicious pulmonary nodules. No  pleural effusion or pneumothorax.  Visualized thyroid is unremarkable.  The heart is top-normal in size. No pericardial effusion. Mild  atherosclerotic calcifications of the aortic arch.  Small thoracic lymph nodes, within normal limits, including:  --6 mm short axis right supraclavicular node (series 2/ image 3),  unchanged  --7 mm short axis subcarinal node (series 2/ image 29), partially  calcified, unchanged  --5 mm short axis right hilar node (series 2/ image 27), previously  7 mm  Status post right mastectomy with right axillary lymph node  dissection.  Degenerative changes of the thoracic spine.  CT ABDOMEN AND PELVIS FINDINGS  Liver is notable for moderate hepatic steatosis. Ovoid hypervascular  lesion in the posterior segment right hepatic dome (series 2/image  24), possibly reflecting a perfusion anomaly or focal fatty sparing,  unchanged. Focal fatty sparing along the gallbladder fossa.  Spleen, pancreas,, and adrenal glands are within normal limits.  Suspected gallbladder adenomyomatosis. No intrahepatic or  extrahepatic ductal dilatation.  Kidneys are within normal limits. No hydronephrosis.  No evidence of bowel obstruction. Normal appendix. Colonic  diverticulosis, without associated inflammatory changes.  Atherosclerotic calcifications of the abdominal  aorta and branch  vessels.  No abdominopelvic ascites.  No suspicious abdominopelvic lymphadenopathy.  Uterus and bilateral ovaries are unremarkable.  Bladder is within normal limits.  Degenerative changes involving the lumbar spine, most prominent at  L3-4. Degenerative changes involving the left hip.  Suspected sclerotic metastases involving the left iliac wing (series  2/image 22), left  iliac bone/ acetabulum (series 2/image 98), and  left sacrum (series 2/image 25), unchanged.  IMPRESSION:  Status post right mastectomy and right axillary lymph node  dissection.  Stable sclerotic osseous metastases in the pelvis, as described  above.  No evidence of new/progressive metastatic disease in the chest,  abdomen, or pelvis.  RECIST 1.0 measurements as above.  Electronically Signed  By: Julian Hy M.D.  On: 11/30/2013 16:06  Assessment and Plan: Barbara Parks 63 y.o. female with  1. Metastatic breast cancer to the lung and bone.  See previous history above.  The patient is on the BOLERO-4 clinical trial with Everolimus 71m daily and Letrozole 2.547mdaily on a 28 day cycle.  She continues to tolerate this medication very well and is doing well.  I reviewed her scans above with her along with NiDoristine JohnsRN from research.  They are stable to slightly improved.  She will proceed with cycle 30 of the BOLERO-4 trial.    2. Osteopenia/sclerotic bone lesions.  Patient has discussed bisphosphanate therapy.  She has been evaluated by a dentist and has been evaluated by an oral surgeon for a tooth that may need to be removed.  We will await this before initiating therapy.    3. Hyperglycemia.  The patient has a fasting blood sugar of 144 today. We discussed her hyperglycemia.  She has changed to a diabetic diet.  She does have longstanding problems with her hip, so we discussed water aerobics and swimming as an exercise that she would be able to do that would be easier on her joints.  She  will continue lifestyle modifications.  A hemoglobin A1C was 8.1, after 3 months of lifestyle modifications if her A1C is not controlled any better, I will start Metformin.    The patient will return on 01/27/14 for labs and evaluation.  She knows to call usKorean the interim for any questions or concerns.  We can certainly see her sooner if needed.    I spent 25 minutes counseling the patient face to face.  The total time spent in the appointment was 30 minutes.  LiMinette [phone]/02/2014 2:22 PM

## 2014-01-11 ENCOUNTER — Encounter (HOSPITAL_COMMUNITY): Payer: Self-pay | Admitting: Pharmacy Technician

## 2014-01-13 NOTE — Patient Instructions (Signed)
Your procedure is scheduled on:  01/18/14  Report to [facility] at 10:00 AM.  Call this number if you have problems the morning of surgery: [phone]   Remember:   Do not eat food or drink liquids after midnight.   Take these medicines the morning of surgery with A SIP OF WATER: Metoprolol   Do not wear jewelry, make-up or nail polish.  Do not wear lotions, powders, or perfumes. You may wear deodorant.  Do not bring valuables to the hospital.  [facility] is not responsible for any belongings or valuables.               Contacts, dentures or bridgework may not be worn into surgery.               Patients discharged the day of surgery will not be allowed to drive home.   Special Instructions: Start using your eye drops prior to surgery as directed by your eye doctor.   Please read over the following fact sheets that you were given: Anesthesia Post-op Instructions and Care and Recovery After Surgery     Cataract Surgery  A cataract is a clouding of the lens of the eye. When a lens becomes cloudy, vision is reduced based on the degree and nature of the clouding. Surgery may be needed to improve vision. Surgery removes the cloudy lens and usually replaces it with a substitute lens (intraocular lens, IOL). LET YOUR EYE DOCTOR KNOW ABOUT:  Allergies to food or medicine.  Medicines taken including herbs, eyedrops, over-the-counter medicines, and creams.  Use of steroids (by mouth or creams).  Previous problems with anesthetics or numbing medicine.  History of bleeding problems or blood clots.  Previous surgery.  Other health problems, including diabetes and kidney problems.  Possibility of pregnancy, if this applies. RISKS AND COMPLICATIONS  Infection.  Inflammation of the eyeball (endophthalmitis) that can spread to both eyes (sympathetic ophthalmia).  Poor wound healing.  If an IOL is inserted, it can later fall out of proper position. This is very uncommon.  Clouding  of the part of your eye that holds an IOL in place. This is called an "after-cataract." These are uncommon, but easily treated. BEFORE THE PROCEDURE  Do not eat or drink anything except small amounts of water for 8 to 12 before your surgery, or as directed by your caregiver.  Unless you are told otherwise, continue any eyedrops you have been prescribed.  Talk to your primary caregiver about all other medicines that you take (both prescription and non-prescription). In some cases, you may need to stop or change medicines near the time of your surgery. This is most important if you are taking blood-thinning medicine.Do not stop medicines unless you are told to do so.  Arrange for someone to drive you to and from the procedure.  Do not put contact lenses in either eye on the day of your surgery. PROCEDURE There is more than one method for safely removing a cataract. Your doctor can explain the differences and help determine which is best for you. Phacoemulsification surgery is the most common form of cataract surgery.  An injection is given behind the eye or eyedrops are given to make this a painless procedure.  A small cut (incision) is made on the edge of the clear, dome-shaped surface that covers the front of the eye (cornea).  A tiny probe is painlessly inserted into the eye. This device gives off ultrasound waves that soften and break up the  cloudy center of the lens. This makes it easier for the cloudy lens to be removed by suction.  An IOL may be implanted.  The normal lens of the eye is covered by a clear capsule. Part of that capsule is intentionally left in the eye to support the IOL.  Your surgeon may or may not use stitches to close the incision. There are other forms of cataract surgery that require a larger incision and stiches to close the eye. This approach is taken in cases where the doctor feels that the cataract cannot be easily removed using phacoemulsification. AFTER  THE PROCEDURE  When an IOL is implanted, it does not need care. It becomes a permanent part of your eye and cannot be seen or felt.  Your doctor will schedule follow-up exams to check on your progress.  Review your other medicines with your doctor to see which can be resumed after surgery.  Use eyedrops or take medicine as prescribed by your doctor. Document Released: 07/04/2011 Document Revised: 10/07/2011 Document Reviewed: 07/04/2011 [facility] Patient Information 2013 [place].    PATIENT INSTRUCTIONS POST-ANESTHESIA  IMMEDIATELY FOLLOWING SURGERY:  Do not drive or operate machinery for the first twenty four hours after surgery.  Do not make any important decisions for twenty four hours after surgery or while taking narcotic pain medications or sedatives.  If you develop intractable nausea and vomiting or a severe headache please notify your doctor immediately.  FOLLOW-UP:  Please make an appointment with your surgeon as instructed. You do not need to follow up with anesthesia unless specifically instructed to do so.  WOUND CARE INSTRUCTIONS (if applicable):  Keep a dry clean dressing on the anesthesia/puncture wound site if there is drainage.  Once the wound has quit draining you may leave it open to air.  Generally you should leave the bandage intact for twenty four hours unless there is drainage.  If the epidural site drains for more than 36-48 hours please call the anesthesia department.  QUESTIONS?:  Please feel free to call your physician or the hospital operator if you have any questions, and they will be happy to assist you.

## 2014-01-14 ENCOUNTER — Encounter (HOSPITAL_COMMUNITY)
Admission: RE | Admit: 2014-01-14 | Discharge: 2014-01-14 | Disposition: A | Discharge status: Home / Self Care | Payer: Medicaid Other | Source: Ambulatory Visit | Attending: Ophthalmology | Admitting: Ophthalmology

## 2014-01-14 ENCOUNTER — Encounter (HOSPITAL_COMMUNITY): Payer: Self-pay

## 2014-01-14 ENCOUNTER — Other Ambulatory Visit: Payer: Self-pay

## 2014-01-14 DIAGNOSIS — Z01810 Encounter for preprocedural cardiovascular examination: Secondary | ICD-10-CM | POA: Insufficient documentation

## 2014-01-14 HISTORY — DX: Pure hypercholesterolemia, unspecified: E78.00

## 2014-01-17 ENCOUNTER — Encounter: Payer: Self-pay | Admitting: *Deleted

## 2014-01-17 DIAGNOSIS — C50919 Malignant neoplasm of unspecified site of unspecified female breast: Secondary | ICD-10-CM

## 2014-01-17 NOTE — Progress Notes (Signed)
01/17/14 at 4:47pm - The pt called the research nurse to inform her of her scheduled cataract surgery for 01/18/14.  The pt gave the nurse 2 new concomitant medications that she started on 01/16/14 in preparation for her surgery.  The pt's 2 new medications are the following:  prolensa 1 drop left eye qd and ofloxacin 1 drop BID for left eye.  The research nurse consulted the pharmacy department for any drug interactions with her everolimus.  Lattie Haw, pharmacist, stated there were no interactions between her new eyedrops and her everolimus.  The research nurse then contacted the site monitor, Deniece Ree, regarding the pt's surgery.  Since he was traveling, he advised site to contact [facility].  Dr. Cecile Hearing Ringeisen responded late in the afternoon.  The research nurse has asked our PI to decide if her everolimus needs to be held.  Awaiting response.   01/18/14 at 9:29am - Dr. Beryle Beams read Dr. Cecile Hearing Ringeisen email.  Dr. Beryle Beams stated that he feels that there is no reason to interrupt her drug.  The research nurse called the pt and left a message telling her to remain on her study drugs with no interruption.

## 2014-01-18 ENCOUNTER — Ambulatory Visit (HOSPITAL_COMMUNITY)
Admission: RE | Admit: 2014-01-18 | Discharge: 2014-01-18 | Disposition: A | Discharge status: Home / Self Care | Payer: Medicaid Other | Source: Ambulatory Visit | Attending: Ophthalmology | Admitting: Ophthalmology

## 2014-01-18 ENCOUNTER — Encounter (HOSPITAL_COMMUNITY): Payer: Medicaid Other | Admitting: Anesthesiology

## 2014-01-18 ENCOUNTER — Ambulatory Visit (HOSPITAL_COMMUNITY): Payer: Medicaid Other | Admitting: Anesthesiology

## 2014-01-18 ENCOUNTER — Encounter (HOSPITAL_COMMUNITY): Payer: Self-pay | Admitting: *Deleted

## 2014-01-18 ENCOUNTER — Encounter (HOSPITAL_COMMUNITY)
Admission: RE | Disposition: A | Discharge status: Home / Self Care | Payer: Self-pay | Source: Ambulatory Visit | Attending: Ophthalmology

## 2014-01-18 DIAGNOSIS — Z7982 Long term (current) use of aspirin: Secondary | ICD-10-CM | POA: Insufficient documentation

## 2014-01-18 DIAGNOSIS — H2510 Age-related nuclear cataract, unspecified eye: Secondary | ICD-10-CM | POA: Insufficient documentation

## 2014-01-18 DIAGNOSIS — Z87891 Personal history of nicotine dependence: Secondary | ICD-10-CM | POA: Insufficient documentation

## 2014-01-18 HISTORY — PX: CATARACT EXTRACTION W/PHACO: SHX586

## 2014-01-18 SURGERY — PHACOEMULSIFICATION, CATARACT, WITH IOL INSERTION
Anesthesia: Monitor Anesthesia Care | Site: Eye | Laterality: Left

## 2014-01-18 MED ORDER — FENTANYL CITRATE 0.05 MG/ML IJ SOLN
25.0000 ug | INTRAMUSCULAR | Status: AC
  Administered 2014-01-18: 25 ug via INTRAVENOUS

## 2014-01-18 MED ORDER — TETRACAINE 0.5 % OP SOLN OPTIME - NO CHARGE
OPHTHALMIC | Status: DC | PRN
  Administered 2014-01-18: 2 [drp] via OPHTHALMIC

## 2014-01-18 MED ORDER — TETRACAINE HCL 0.5 % OP SOLN
OPHTHALMIC | Status: AC
  Filled 2014-01-18: qty 2

## 2014-01-18 MED ORDER — EPINEPHRINE HCL 1 MG/ML IJ SOLN
INTRAMUSCULAR | Status: AC
  Filled 2014-01-18: qty 1

## 2014-01-18 MED ORDER — FENTANYL CITRATE 0.05 MG/ML IJ SOLN
INTRAMUSCULAR | Status: AC
  Filled 2014-01-18: qty 2

## 2014-01-18 MED ORDER — TETRACAINE HCL 0.5 % OP SOLN
1.0000 [drp] | OPHTHALMIC | Status: AC
  Administered 2014-01-18 (×3): 1 [drp] via OPHTHALMIC

## 2014-01-18 MED ORDER — CYCLOPENTOLATE-PHENYLEPHRINE OP SOLN OPTIME - NO CHARGE
OPHTHALMIC | Status: AC
  Filled 2014-01-18: qty 2

## 2014-01-18 MED ORDER — KETOROLAC TROMETHAMINE 0.5 % OP SOLN
OPHTHALMIC | Status: AC
  Filled 2014-01-18: qty 5

## 2014-01-18 MED ORDER — CYCLOPENTOLATE-PHENYLEPHRINE 0.2-1 % OP SOLN
1.0000 [drp] | OPHTHALMIC | Status: AC
  Administered 2014-01-18 (×3): 1 [drp] via OPHTHALMIC

## 2014-01-18 MED ORDER — TRYPAN BLUE 0.06 % OP SOLN
OPHTHALMIC | Status: AC
  Filled 2014-01-18: qty 0.5

## 2014-01-18 MED ORDER — PROVISC 10 MG/ML IO SOLN
INTRAOCULAR | Status: DC | PRN
  Administered 2014-01-18: 0.85 mL via INTRAOCULAR

## 2014-01-18 MED ORDER — MIDAZOLAM HCL 2 MG/2ML IJ SOLN
INTRAMUSCULAR | Status: AC
  Filled 2014-01-18: qty 2

## 2014-01-18 MED ORDER — PHENYLEPHRINE HCL 2.5 % OP SOLN
1.0000 [drp] | OPHTHALMIC | Status: AC
  Administered 2014-01-18 (×3): 1 [drp] via OPHTHALMIC

## 2014-01-18 MED ORDER — KETOROLAC TROMETHAMINE 0.5 % OP SOLN
1.0000 [drp] | OPHTHALMIC | Status: AC
  Administered 2014-01-18 (×3): 1 [drp] via OPHTHALMIC

## 2014-01-18 MED ORDER — MIDAZOLAM HCL 2 MG/2ML IJ SOLN
1.0000 mg | INTRAMUSCULAR | Status: DC | PRN
  Administered 2014-01-18: 2 mg via INTRAVENOUS

## 2014-01-18 MED ORDER — EPINEPHRINE HCL 1 MG/ML IJ SOLN
INTRAOCULAR | Status: DC | PRN
  Administered 2014-01-18: 12:00:00

## 2014-01-18 MED ORDER — LACTATED RINGERS IV SOLN
INTRAVENOUS | Status: DC
  Administered 2014-01-18: 11:00:00 via INTRAVENOUS

## 2014-01-18 MED ORDER — PHENYLEPHRINE HCL 2.5 % OP SOLN
OPHTHALMIC | Status: AC
  Filled 2014-01-18: qty 15

## 2014-01-18 MED ORDER — TRYPAN BLUE 0.06 % OP SOLN
OPHTHALMIC | Status: DC | PRN
  Administered 2014-01-18: .25 mL via INTRAOCULAR

## 2014-01-18 MED ORDER — BSS IO SOLN
INTRAOCULAR | Status: DC | PRN
  Administered 2014-01-18: 15 mL via INTRAOCULAR

## 2014-01-18 SURGICAL SUPPLY — 28 items
CAPSULAR TENSION RING-AMO (OPHTHALMIC RELATED) IMPLANT
CLOTH BEACON ORANGE TIMEOUT ST (SAFETY) ×3 IMPLANT
EYE SHIELD UNIVERSAL CLEAR (GAUZE/BANDAGES/DRESSINGS) ×3 IMPLANT
GLOVE BIO SURGEON STRL SZ 6.5 (GLOVE) IMPLANT
GLOVE BIO SURGEONS STRL SZ 6.5 (GLOVE)
GLOVE BIOGEL PI IND STRL 6.5 (GLOVE) ×1 IMPLANT
GLOVE BIOGEL PI IND STRL 7.5 (GLOVE) ×2 IMPLANT
GLOVE BIOGEL PI INDICATOR 6.5 (GLOVE) ×2
GLOVE BIOGEL PI INDICATOR 7.5 (GLOVE) ×4
GLOVE ECLIPSE 6.5 STRL STRAW (GLOVE) IMPLANT
GLOVE ECLIPSE 7.0 STRL STRAW (GLOVE) IMPLANT
GLOVE EXAM NITRILE LRG STRL (GLOVE) IMPLANT
GLOVE EXAM NITRILE MD LF STRL (GLOVE) IMPLANT
GLOVE SKINSENSE NS SZ6.5 (GLOVE)
GLOVE SKINSENSE STRL SZ6.5 (GLOVE) IMPLANT
HEALON 5 0.6 ML (INTRAOCULAR LENS) IMPLANT
KIT VITRECTOMY (OPHTHALMIC RELATED) IMPLANT
PAD ARMBOARD 7.5X6 YLW CONV (MISCELLANEOUS) ×3 IMPLANT
PROC W NO LENS (INTRAOCULAR LENS)
PROC W SPEC LENS (INTRAOCULAR LENS)
PROCESS W NO LENS (INTRAOCULAR LENS) IMPLANT
PROCESS W SPEC LENS (INTRAOCULAR LENS) IMPLANT
RING MALYGIN (MISCELLANEOUS) IMPLANT
SIGHTPATH CAT PROC W REG LENS (Ophthalmic Related) ×3 IMPLANT
TAPE SURG TRANSPORE 1 IN (GAUZE/BANDAGES/DRESSINGS) ×1 IMPLANT
TAPE SURGICAL TRANSPORE 1 IN (GAUZE/BANDAGES/DRESSINGS) ×2
VISCOELASTIC ADDITIONAL (OPHTHALMIC RELATED) IMPLANT
WATER STERILE IRR 250ML POUR (IV SOLUTION) ×3 IMPLANT

## 2014-01-18 NOTE — Anesthesia Preprocedure Evaluation (Signed)
Anesthesia Evaluation  Patient identified by MRN, date of birth, ID band Patient awake    Reviewed: Allergy & Precautions, H&P , NPO status , Patient's Chart, lab work & pertinent test results  Airway Mallampati: II TM Distance: <3 FB Neck ROM: Full    Dental  (+) Teeth Intact, Dental Advisory Given   Pulmonary shortness of breath, at rest and lying, asthma , former smoker,  Lung mets  breath sounds clear to auscultation        Cardiovascular negative cardio ROS  Rhythm:regular Rate:Normal     Neuro/Psych  Headaches,    GI/Hepatic   Endo/Other    Renal/GU      Musculoskeletal  (+) Arthritis -,   Abdominal   Peds  Hematology   Anesthesia Other Findings   Reproductive/Obstetrics                           Anesthesia Physical Anesthesia Plan  ASA: IV  Anesthesia Plan: MAC   Post-op Pain Management:    Induction: Intravenous  Airway Management Planned: Nasal Cannula  Additional Equipment:   Intra-op Plan:   Post-operative Plan:   Informed Consent: I have reviewed the patients History and Physical, chart, labs and discussed the procedure including the risks, benefits and alternatives for the proposed anesthesia with the patient or authorized representative who has indicated his/her understanding and acceptance.     Plan Discussed with:   Anesthesia Plan Comments:         Anesthesia Quick Evaluation

## 2014-01-18 NOTE — Discharge Instructions (Signed)
Cataract Surgery °Care After °Refer to this sheet in the next few weeks. These instructions provide you with information on caring for yourself after your procedure. Your caregiver may also give you more specific instructions. Your treatment has been planned according to current medical practices, but problems sometimes occur. Call your caregiver if you have any problems or questions after your procedure.  °HOME CARE INSTRUCTIONS  °· Avoid strenuous activities as directed by your caregiver. °· Ask your caregiver when you can resume driving. °· Use eyedrops or other medicines to help healing and control pressure inside your eye as directed by your caregiver. °· Only take over-the-counter or prescription medicines for pain, discomfort, or fever as directed by your caregiver. °· Do not to touch or rub your eyes. °· You may be instructed to use a protective shield during the first few days and nights after surgery. If not, wear sunglasses to protect your eyes. This is to protect the eye from pressure or from being accidentally bumped. °· Keep the area around your eye clean and dry. Avoid swimming or allowing water to hit you directly in the face while showering. Keep soap and shampoo out of your eyes. °· Do not bend or lift heavy objects. Bending increases pressure in the eye. You can walk, climb stairs, and do light household chores. °· Do not put a contact lens into the eye that had surgery until your caregiver says it is okay to do so. °· Ask your doctor when you can return to work. This will depend on the kind of work that you do. If you work in a dusty environment, you may be advised to wear protective eyewear for a period of time. °· Ask your caregiver when it will be safe to engage in sexual activity. °· Continue with your regular eye exams as directed by your caregiver. °What to expect: °· It is normal to feel itching and mild discomfort for a few days after cataract surgery. Some fluid discharge is also common,  and your eye may be sensitive to light and touch. °· After 1 to 2 days, even moderate discomfort should disappear. In most cases, healing will take about 6 weeks. °· If you received an intraocular lens (IOL), you may notice that colors are very bright or have a blue tinge. Also, if you have been in bright sunlight, everything may appear reddish for a few hours. If you see these color tinges, it is because your lens is clear and no longer cloudy. Within a few months after receiving an IOL, these extra colors should go away. When you have healed, you will probably need new glasses. °SEEK MEDICAL CARE IF:  °· You have increased bruising around your eye. °· You have discomfort not helped by medicine. °SEEK IMMEDIATE MEDICAL CARE IF:  °· You have a  fever. °· You have a worsening or sudden vision loss. °· You have redness, swelling, or increasing pain in the eye. °· You have a thick discharge from the eye that had surgery. °MAKE SURE YOU: °· Understand these instructions. °· Will watch your condition. °· Will get help right away if you are not doing well or get worse. °Document Released: 02/01/2005 Document Revised: 10/07/2011 Document Reviewed: 03/08/2011 °[employer]® Patient Information ©2015 [employer]. This information is not intended to replace advice given to you by your health care provider. Make sure you discuss any questions you have with your health care provider. ° °

## 2014-01-18 NOTE — Transfer of Care (Signed)
Immediate Anesthesia Transfer of Care Note  Patient: Barbara Parks  Procedure(s) Performed: Procedure(s) with comments: CATARACT EXTRACTION PHACO AND INTRAOCULAR LENS PLACEMENT (IOC) (Left) - CDE 15.79  Patient Location: Short Stay  Anesthesia Type:MAC  Level of Consciousness: awake, alert , oriented and patient cooperative  Airway & Oxygen Therapy: Patient Spontanous Breathing  Post-op Assessment: Report given to PACU RN, Post -op Vital signs reviewed and stable and Patient moving all extremities  Post vital signs: Reviewed and stable  Complications: No apparent anesthesia complications

## 2014-01-18 NOTE — H&P (Signed)
The patient was re examined and there is no change in the patients condition since the original H and P. 

## 2014-01-18 NOTE — Op Note (Signed)
Patient brought to the operating room and prepped and draped in the usual manner.  Lid speculum inserted in left eye.  Stab incision made at the twelve o'clock position. Trypan Blue instilled and removed.  Provisc instilled in the anterior chamber.   A 2.4 mm. Stab incision was made temporally.  An anterior capsulotomy was done with a bent 25 gauge needle.  The nucleus was hydrodissected.  The Phaco tip was inserted in the anterior chamber and the nucleus was emulsified.  CDE was 15.79.  The cortical material was then removed with the I and A tip.  Posterior capsule was the polished.  The anterior chamber was deepened with Provisc.  A 18.5 Diopter Rayner 570C IOL was then inserted in the capsular bag.  Provisc was then removed with the I and A tip.  The wound was then hydrated.  Patient sent to the Recovery Room in good condition with follow up in my office.  Preoperative Diagnosis:  Nuclear Cataract OS Postoperative Diagnosis:  Same Procedure name: Kelman Phacoemulsification OS with IOL

## 2014-01-18 NOTE — Anesthesia Postprocedure Evaluation (Signed)
  Anesthesia Post-op Note  Patient: Barbara Parks  Procedure(s) Performed: Procedure(s) with comments: CATARACT EXTRACTION PHACO AND INTRAOCULAR LENS PLACEMENT (IOC) (Left) - CDE 15.79  Patient Location: Short Stay  Anesthesia Type:MAC  Level of Consciousness: awake, alert , oriented and patient cooperative  Airway and Oxygen Therapy: Patient Spontanous Breathing  Post-op Pain: none  Post-op Assessment: Post-op Vital signs reviewed, Patient's Cardiovascular Status Stable, Respiratory Function Stable, Patent Airway and Pain level controlled  Post-op Vital Signs: Reviewed and stable  Last Vitals:  Filed Vitals:   01/18/14 1120  BP: 123/65  Temp:   Resp: 16    Complications: No apparent anesthesia complications

## 2014-01-19 ENCOUNTER — Encounter (HOSPITAL_COMMUNITY): Payer: Self-pay | Admitting: Ophthalmology

## 2014-01-24 ENCOUNTER — Telehealth: Payer: Self-pay | Admitting: Oncology

## 2014-01-24 ENCOUNTER — Other Ambulatory Visit: Payer: Self-pay | Admitting: *Deleted

## 2014-01-24 DIAGNOSIS — C50919 Malignant neoplasm of unspecified site of unspecified female breast: Secondary | ICD-10-CM

## 2014-01-24 NOTE — Telephone Encounter (Signed)
, °

## 2014-01-25 ENCOUNTER — Encounter (HOSPITAL_COMMUNITY): Payer: Self-pay

## 2014-01-25 ENCOUNTER — Ambulatory Visit (HOSPITAL_COMMUNITY)
Admission: RE | Admit: 2014-01-25 | Discharge: 2014-01-25 | Disposition: A | Discharge status: Home / Self Care | Payer: Medicaid Other | Source: Ambulatory Visit | Attending: Adult Health | Admitting: Adult Health

## 2014-01-25 DIAGNOSIS — C7800 Secondary malignant neoplasm of unspecified lung: Secondary | ICD-10-CM | POA: Diagnosis not present

## 2014-01-25 DIAGNOSIS — C7931 Secondary malignant neoplasm of brain: Secondary | ICD-10-CM | POA: Diagnosis not present

## 2014-01-25 DIAGNOSIS — I700 Atherosclerosis of aorta: Secondary | ICD-10-CM | POA: Insufficient documentation

## 2014-01-25 DIAGNOSIS — I889 Nonspecific lymphadenitis, unspecified: Secondary | ICD-10-CM | POA: Diagnosis not present

## 2014-01-25 DIAGNOSIS — C50919 Malignant neoplasm of unspecified site of unspecified female breast: Secondary | ICD-10-CM | POA: Insufficient documentation

## 2014-01-25 DIAGNOSIS — Z9010 Acquired absence of unspecified breast and nipple: Secondary | ICD-10-CM | POA: Diagnosis not present

## 2014-01-25 DIAGNOSIS — C7952 Secondary malignant neoplasm of bone marrow: Secondary | ICD-10-CM

## 2014-01-25 DIAGNOSIS — J984 Other disorders of lung: Secondary | ICD-10-CM | POA: Diagnosis not present

## 2014-01-25 DIAGNOSIS — Z9221 Personal history of antineoplastic chemotherapy: Secondary | ICD-10-CM | POA: Insufficient documentation

## 2014-01-25 DIAGNOSIS — M1610 Unilateral primary osteoarthritis, unspecified hip: Secondary | ICD-10-CM | POA: Diagnosis not present

## 2014-01-25 DIAGNOSIS — C7949 Secondary malignant neoplasm of other parts of nervous system: Secondary | ICD-10-CM

## 2014-01-25 DIAGNOSIS — C7951 Secondary malignant neoplasm of bone: Secondary | ICD-10-CM | POA: Diagnosis not present

## 2014-01-25 DIAGNOSIS — M169 Osteoarthritis of hip, unspecified: Secondary | ICD-10-CM | POA: Insufficient documentation

## 2014-01-25 MED ORDER — IOHEXOL 300 MG/ML  SOLN
50.0000 mL | Freq: Once | INTRAMUSCULAR | Status: AC | PRN
  Administered 2014-01-25: 50 mL via ORAL

## 2014-01-25 MED ORDER — IOHEXOL 300 MG/ML  SOLN
100.0000 mL | Freq: Once | INTRAMUSCULAR | Status: AC | PRN
  Administered 2014-01-25: 100 mL via INTRAVENOUS

## 2014-01-26 ENCOUNTER — Encounter (HOSPITAL_COMMUNITY): Payer: Self-pay | Admitting: Pharmacy Technician

## 2014-01-27 ENCOUNTER — Other Ambulatory Visit: Payer: Self-pay | Admitting: *Deleted

## 2014-01-27 ENCOUNTER — Encounter: Payer: Self-pay | Admitting: Adult Health

## 2014-01-27 ENCOUNTER — Ambulatory Visit (HOSPITAL_BASED_OUTPATIENT_CLINIC_OR_DEPARTMENT_OTHER): Payer: Medicare Other | Admitting: Adult Health

## 2014-01-27 ENCOUNTER — Other Ambulatory Visit (HOSPITAL_BASED_OUTPATIENT_CLINIC_OR_DEPARTMENT_OTHER): Payer: Medicare Other

## 2014-01-27 ENCOUNTER — Encounter: Payer: Self-pay | Admitting: *Deleted

## 2014-01-27 VITALS — BP 125/82 | HR 93 | Temp 97.8°F | Resp 18 | Ht 65.0 in | Wt 174.3 lb

## 2014-01-27 DIAGNOSIS — C7800 Secondary malignant neoplasm of unspecified lung: Secondary | ICD-10-CM

## 2014-01-27 DIAGNOSIS — C7951 Secondary malignant neoplasm of bone: Secondary | ICD-10-CM | POA: Diagnosis not present

## 2014-01-27 DIAGNOSIS — E7800 Pure hypercholesterolemia, unspecified: Secondary | ICD-10-CM | POA: Diagnosis not present

## 2014-01-27 DIAGNOSIS — C50419 Malignant neoplasm of upper-outer quadrant of unspecified female breast: Secondary | ICD-10-CM

## 2014-01-27 DIAGNOSIS — C7952 Secondary malignant neoplasm of bone marrow: Secondary | ICD-10-CM

## 2014-01-27 DIAGNOSIS — C50919 Malignant neoplasm of unspecified site of unspecified female breast: Secondary | ICD-10-CM | POA: Diagnosis not present

## 2014-01-27 LAB — CBC WITH DIFFERENTIAL/PLATELET
BASO%: 0.6 % (ref 0.0–2.0)
BASOS ABS: 0 10*3/uL (ref 0.0–0.1)
EOS ABS: 0.2 10*3/uL (ref 0.0–0.5)
EOS%: 2.6 % (ref 0.0–7.0)
HEMATOCRIT: 41.7 % (ref 34.8–46.6)
HGB: 13.7 g/dL (ref 11.6–15.9)
LYMPH%: 15 % (ref 14.0–49.7)
MCH: 26.3 pg (ref 25.1–34.0)
MCHC: 32.9 g/dL (ref 31.5–36.0)
MCV: 80 fL (ref 79.5–101.0)
MONO#: 0.6 10*3/uL (ref 0.1–0.9)
MONO%: 9.4 % (ref 0.0–14.0)
NEUT%: 72.4 % (ref 38.4–76.8)
NEUTROS ABS: 4.5 10*3/uL (ref 1.5–6.5)
Platelets: 294 10*3/uL (ref 145–400)
RBC: 5.21 10*6/uL (ref 3.70–5.45)
RDW: 14 % (ref 11.2–14.5)
WBC: 6.2 10*3/uL (ref 3.9–10.3)
lymph#: 0.9 10*3/uL (ref 0.9–3.3)

## 2014-01-27 LAB — COMPREHENSIVE METABOLIC PANEL (CC13)
ALK PHOS: 265 U/L — AB (ref 40–150)
ALT: 44 U/L (ref 0–55)
ANION GAP: 12 meq/L — AB (ref 3–11)
AST: 44 U/L — ABNORMAL HIGH (ref 5–34)
Albumin: 3.5 g/dL (ref 3.5–5.0)
BILIRUBIN TOTAL: 0.33 mg/dL (ref 0.20–1.20)
BUN: 17 mg/dL (ref 7.0–26.0)
CO2: 19 meq/L — AB (ref 22–29)
CREATININE: 0.9 mg/dL (ref 0.6–1.1)
Calcium: 10.3 mg/dL (ref 8.4–10.4)
Chloride: 109 mEq/L (ref 98–109)
GLUCOSE: 150 mg/dL — AB (ref 70–140)
Potassium: 4 mEq/L (ref 3.5–5.1)
Sodium: 140 mEq/L (ref 136–145)
TOTAL PROTEIN: 7.9 g/dL (ref 6.4–8.3)

## 2014-01-27 LAB — LIPID PANEL
CHOLESTEROL: 246 mg/dL — AB (ref 0–200)
HDL: 48 mg/dL (ref >39)
LDL Cholesterol: 140 mg/dL — ABNORMAL HIGH (ref 0–99)
TRIGLYCERIDES: 289 mg/dL — AB (ref <150)
Total CHOL/HDL Ratio: 5.1 Ratio
VLDL: 58 mg/dL — ABNORMAL HIGH (ref 0–40)

## 2014-01-27 LAB — PHOSPHORUS: PHOSPHORUS: 3.3 mg/dL (ref 2.3–4.6)

## 2014-01-27 LAB — URIC ACID (CC13): Uric Acid, Serum: 3.5 mg/dl (ref 2.6–7.4)

## 2014-01-27 LAB — BILIRUBIN, DIRECT: Bilirubin, Direct: 0.2 mg/dL (ref 0.0–0.3)

## 2014-01-27 LAB — CK: Total CK: 72 U/L (ref 7–177)

## 2014-01-27 NOTE — Progress Notes (Signed)
01/27/14 at 10:16am - [employer] Bolero 4 - cycle 31, day 1 study notes - The pt was into the cancer center this am for her fasting labs and cycle 31 assessments.  The pt confirmed that she was fasting upon arrival to the cancer center for her labs.  The pt returned her cycle 30 everolimus study drug box.  She confirmed that she took her everolimus (10 mg) everyday along with her letrozole (2.5 mg) from 12/30/13 to 01/26/14 (28 days).  The pt's everolimus drug box had 14 remaining (unopened) blister packs.  The pt was dispensed 70 pills - 56 taken (28 days x 2 doses= 56 opened blister packs) = 14 returned.  Therefore, the pt is 100% compliant with her study drug, everolimus, for cycle 30.  The pt's everolimus was taken to the pharmacy for the drug accountability check and storage.  The pt was seen and examined today by Charlestine Massed, NP.  The NP reviewed her labs, and she felt that the pt's "lab abnormals" were not clinically significant.  The pt has lost more weight and reports she is trying to make the right choices with her diet.  However, the pt's glucose is still elevated (hyperglycemia ongoing).  The pt states she doesn't want to take any medications to lower her glucose.  The pt's glucose level today is stable.  Dr. Jana Hakim will see the pt at her next visit and will review her most recent hemoglobin A1C.  The pt was encouraged to keep trying to lose weight and watch her diet.  The pt states her hip pain is stable.  She reports "some days it hurts more than others" depending on her daily activities.  The radiologist noted that she has "advanced left hip osteoarthritis".  The pt states that she has done well recovering from her left cataract surgery.  She states her vision is much improved.  She is continuing to use her left eyedrops (Prolensa and Ofloxacin).  The pt is scheduled to have her right eye cataract surgery on 02/08/14.  She states she will discontinue her left eyedrops on 02/01/14 and start her right  eyedrops on 02/06/14 in preparation for her surgery on 02/08/14.  The pt states that she has experienced some mild "bloating" since 01/18/14 (date of her cataract surgery).  She states she has some mild arthralgia on 01/25/14 that quickly resolved on 01/26/14.  She states that the following AE's are ongoing:  hot flashes, memory impairment, intermittent nausea and diarrhea, arthritis in hands, right eye cataract, fatigue, and bilateral knee pain.  The pt states she is continuing to eat her yogurt daily to help her digestion.  The pt's vitals are stable.  The pt's current medications were reviewed with the pt.  She reports the following medications as ongoing:  Lopressor, compazine prn, immodium prn, lopid, ibuprofen prn, and vitamin D3.  She is performing all of her usual activities with some limitations.  ECOG =1.  The pt was given her appts for July and August.  The pt was dispensed her everolimus drug box for self administration for cycle 31.  The pt's lipid panel is pending.    01/27/14 at 3:54pm - The pt's lipid panel is stable.

## 2014-01-27 NOTE — Progress Notes (Signed)
Hematology and Oncology Follow Up Visit  Barbara Parks [id number] July 05, 1951 63 y.o. 01/29/2014 9:00 AM     Principal Diagnosis:Barbara Parks 63 y.o. female with metastatic breast cancer to the lung and bone.   Prior Therapy:  #1 patient originally presented when she was 63 years old with early stage breast cancer in 2005. At that time she underwent a lumpectomy with axillary lymph node dissection. She was followed by Dr. Eston Parks and Barbara Parks. Her last visit to Dr. Julien Parks office was in 2006. Because of difficulties with insurance she had not been receiving mammograms.   #2 in 2012 patient began having shortness of breath on exertion. She was seen by pulmonary service and a CT scan showed some minor mediastinal adenopathy as well as borderline lower lobe nodule. PET scan was not performed. October 2012 patient began to experience more shortness of breath. Initially she was told that she had pneumonia. However subsequently she developed lower extremity edema. CT of the chest was obtained that showed a large pleural effusion in the right side as well as pericardial effusion. She was seen by cardiothoracic surgery placed in 09/11/2011. The cytology revealed that patient indeed have recurrent breast cancer that was hormone receptor positive HER-2/neu was not obtained as it was insufficient tissue. She subsequently had a CT scan performed that showed 2 nodules in the lung suspicious for metastatic disease as well as left lower lobe collapse. She had a biopsy of the lungs performed on 09/11/2011 the tumor was estrogen receptor +91% progesterone receptor +100% proliferation marker Ki-67 35%.   #3 patient was seen by Dr. Eston Parks and he recommended that she be enrolled on the BOLERO-4 clinical trial utilizing letrozole 2.5 mg per day and affinitor10 mg per day. She initiated this on 10/10/2011.  she has had a significant response to this.  Current therapy:  BOLERO -4 with everolimus 10 mg and  letrozole 2.5 mg daily. Patient will begin cycle 31 day 1 today. ( 28 day cycles)  Interim History: Barbara Parks 63 y.o. female with metastatic breast cancer here for her monthly evaluation on the BOLERO-4 clinical trial. Barbara Parks is doing moderately well today.  She does have some bloating and did have recent left eye cataract surgery.  Her right eye will be done on 02/01/14.  She tolerated this procedure well.  She is now able to drive because her vision has improved since surgery.  She has been bloated since cataract surgery and undergoing anesthesia.  She did have pain in her left hip that was severe last night.  She says her hamstring was tightened and she does have a h/o osteoarthritis in that hip that is a chronic problem for her.  She did have scans done on 01/25/14 and would like to review those results today as well.  She is taking the Everolimus and Letrozole as prescribed and continues to tolerate this well.  She did have some joint aches this past Tuesday night, that were relieved the next morning when she woke up after sleeping, and have since resolved.  She denies fevers, chills, nausea, vomiting, constipation, numbness/tingling, mouth ulcers, diarrhea, skin changes, or any further concerns.   Medications:  Current Outpatient Prescriptions  Medication Sig Dispense Refill  . Bromfenac Sodium (PROLENSA) 0.07 % SOLN Apply 1 drop to eye every morning. Pt instills 1 drop to left eye daily.      . cholecalciferol (VITAMIN D) 1000 UNITS tablet Take 2,000 Units by mouth daily.       Marland Kitchen  gemfibrozil (LOPID) 600 MG tablet TAKE ONE TABLET BY MOUTH TWICE DAILY BEFORE  A  MEAL  90 tablet  0  . ibuprofen (ADVIL,MOTRIN) 200 MG tablet Take 400 mg by mouth every 6 (six) hours as needed. For pain      . Investigational everolimus (RAD001) 5 MG tablet Novartis [id number] Take 2 tablets by mouth daily. Take with a glass of water.  70 tablet  0  . letrozole (FEMARA) 2.5 MG tablet TAKE ONE TABLET BY MOUTH  ONCE DAILY  90 tablet  0  . metoprolol tartrate (LOPRESSOR) 25 MG tablet Take 0.5 tablets (12.5 mg total) by mouth daily.  30 tablet  4  . loperamide (IMODIUM) 2 MG capsule Take 2 mg by mouth 4 (four) times daily as needed for diarrhea or loose stools.       No current facility-administered medications for this visit.     Allergies:  Allergies  Allergen Reactions  . Aspirin     REACTION: upset stomach  . Latex Other (See Comments)    Blistering and skin peels off  . Nsaids Nausea And Vomiting    Extreme nausea and vomiting    Medical History: Past Medical History  Diagnosis Date  . Asthma   . Shortness of breath   . Arthritis     left hip  . Contact dermatitis     from a bandaid that had Latex   . breast ca 2005    breast/chemo R mastectomy  . Metastasis to lung dx'd 08/2011  . Osteopenia due to cancer therapy 09/09/2013  . Hypercholesteremia     Surgical History:  Past Surgical History  Procedure Laterality Date  . Spine surgery  1996  . Breast surgery  2005    right  . Video bronchoscopy  09/11/2011    Procedure: VIDEO BRONCHOSCOPY;  Surgeon: Gaye Pollack, MD;  Location: [place];  Service: Thoracic;  Laterality: N/A;  . Chest tube insertion  09/11/2011    Procedure: INSERTION PLEURAL DRAINAGE CATHETER;  Surgeon: Gaye Pollack, MD;  Location: [facility];  Service: Thoracic;  Laterality: Right;  . Pericardial window  09/11/2011    Procedure: PERICARDIAL WINDOW;  Surgeon: Gaye Pollack, MD;  Location: [facility];  Service: Thoracic;  Laterality: N/A;  . Removal of pleural drainage catheter  12/19/2011    Procedure: REMOVAL OF PLEURAL DRAINAGE CATHETER;  Surgeon: Gaye Pollack, MD;  Location: [facility];  Service: Thoracic;  Laterality: Right;  TO BE DONE IN MINOR ROOM, SHORT STAY  . Cataract extraction w/phaco Left 01/18/2014    Procedure: CATARACT EXTRACTION PHACO AND INTRAOCULAR LENS PLACEMENT (IOC);  Surgeon: Elta Guadeloupe T. Gershon Crane, MD;  Location: [facility];  Service: Ophthalmology;   Laterality: Left;  CDE 15.79     Review of Systems: A 10 point review of systems was conducted and is otherwise negative except for what is noted above.     Physical Exam: Blood pressure 125/82, pulse 93, temperature 97.8 F (36.6 C), temperature source Oral, resp. rate 18, height _0  (1.651 m), weight 174 lb 4.8 oz (79.062 kg). GENERAL: Patient is a well appearing female in no acute distress HEENT:  Sclerae anicteric.  Oropharynx clear and moist. No ulcerations or evidence of oropharyngeal candidiasis. Neck is supple.  NODES:  No cervical, supraclavicular, or axillary lymphadenopathy palpated.  BREAST EXAM:  Deferred. LUNGS:  Clear to auscultation bilaterally.  No wheezes or rhonchi. HEART:  Regular rate and rhythm. No murmur appreciated. ABDOMEN:  Soft, nontender.  Positive, normoactive bowel sounds. No organomegaly palpated. MSK:  No focal spinal tenderness to palpation. Full range of motion bilaterally in the upper extremities. EXTREMITIES:  No peripheral edema.   SKIN:  Clear with no obvious rashes or skin changes. No nail dyscrasia. NEURO:  Nonfocal. Well oriented.  Appropriate affect. ECOG PERFORMANCE STATUS: 1 - Symptomatic but completely ambulatory   Lab Results: Lab Results  Component Value Date   WBC 6.2 01/27/2014   HGB 13.7 01/27/2014   HCT 41.7 01/27/2014   MCV 80.0 01/27/2014   PLT 294 01/27/2014     Chemistry      Component Value Date/Time   NA 140 01/27/2014 0837   NA 136 07/16/2012 0910   K 4.0 01/27/2014 0837   K 4.0 07/16/2012 0910   CL 109* 12/31/2012 0940   CL 103 07/16/2012 0910   CO2 19* 01/27/2014 0837   CO2 21 07/16/2012 0910   BUN 17.0 01/27/2014 0837   BUN 16 07/16/2012 0910   CREATININE 0.9 01/27/2014 0837   CREATININE 0.75 07/16/2012 0910      Component Value Date/Time   CALCIUM 10.3 01/27/2014 0837   CALCIUM 10.1 07/16/2012 0910   ALKPHOS 265* 01/27/2014 0837   ALKPHOS 142* 07/16/2012 0910   AST 44* 01/27/2014 0837   AST 30 07/16/2012 0910   ALT 44  01/27/2014 0837   ALT 33 07/16/2012 0910   BILITOT 0.33 01/27/2014 0837   BILITOT 0.2* 07/16/2012 0910       Radiological Studies: CLINICAL DATA: Right breast cancer with metastatic disease to the  lung, brain and bone. Restaging post oral chemotherapy.  EXAM:  CT CHEST, ABDOMEN, AND PELVIS WITH CONTRAST  TECHNIQUE:  Multidetector CT imaging of the chest, abdomen and pelvis was  performed following the standard protocol during bolus  administration of intravenous contrast.  CONTRAST: 50m OMNIPAQUE IOHEXOL 300 MG/ML SOLN, 104mOMNIPAQUE  IOHEXOL 300 MG/ML SOLN  COMPARISON: Prior examinations 11/30/2013 and 10/05/2013.  FINDINGS:  RECIST 1.1  Target Lesions:  1. Right lower lobe pulmonary nodule measures 6 mm on image 38,  unchanged.  2. Partially calcified subcarinal lymph node measures 7 mm on image  28, unchanged.  3. Right hilar lymph node measures 4 mm on image 26, previously 5  mm.  Non-target Lesions:  1. Non measurable.  2.  CT CHEST FINDINGS  There are stable postsurgical changes status post right mastectomy  and axillary node dissection. There are no enlarged axillary,  internal mammary, mediastinal or hilar lymph nodes.  The heart size is stable. There is stable mild atherosclerosis. No  pleural or pericardial effusion is present.  The lungs appear stable with linear right lower lobe scarring and a  6 mm subpleural nodule on image 38 (noted above). No new or  enlarging nodules are demonstrated.  CT ABDOMEN AND PELVIS FINDINGS  The liver has a stable appearance with steatosis and probable focal  sparing around the gallbladder and posteriorly in the right hepatic  lobe (image 53). No new or enlarging liver lesions are identified.  Nodularity gallbladder wall appears stable.  The spleen, pancreas, adrenal glands and kidneys appear normal.  The stomach, small bowel, appendix and colon demonstrate no  significant findings. There is stable aortoiliac atherosclerosis.   There is no adenopathy or ascites. The bladder, uterus and ovaries  appear unchanged.  There are stable sclerotic lesions within the left iliac bone and  sacrum, probably partially treated metastases. Advanced left hip  osteoarthritis again noted.  IMPRESSION:  1.  No residual measurable extra osseous metastatic disease within  the chest, abdomen or pelvis.  2. Stable sclerotic lesions within the left iliac bone and sacrum.  3. No evidence of local recurrence.  4. Stable heterogeneous hepatic steatosis.  Electronically Signed  By: [name] M.D.  On: 01/25/2014 14:05   Assessment and Plan: BINDI KLOMP 63 y.o. female with  1. Metastatic breast cancer to the lung and bone.  See previous history above.  The patient is on the BOLERO-4 clinical trial with Everolimus 51m daily and Letrozole 2.5109mdaily on a 28 day cycle.  She is tolerating the medication well and is pleased with her results. I reviewed her scans above with her along with NiDoristine JohnsRN from research.  They are stable to slightly improved.  She will proceed with cycle 31 of the BOLERO-4 trial.    2. Osteopenia/sclerotic bone lesions.  Patient has discussed bisphosphanate therapy.  She has been evaluated by a dentist and has been evaluated by an oral surgeon for a tooth that may need to be removed.  We will await this before initiating therapy.  She is planning on following up with the oral surgeon after her cataracts are fixed.    3. Hyperglycemia.  The patient has a fasting blood sugar of 150 today. We discussed her hyperglycemia.  She has changed to a diabetic diet and is exercising. She will continue lifestyle modifications and she has lost some weight.  A hemoglobin A1C was 8.1 in May, 2015, after 3 months of lifestyle modifications if her A1C is not controlled any better, we will start Metformin.  We will check the hemoglobin A1C at her next appointment.      The patient will return on 02/24/14 for labs and evaluation.   She knows to call usKorean the interim for any questions or concerns.  We can certainly see her sooner if needed.    I spent 25 minutes counseling the patient face to face.  The total time spent in the appointment was 30 minutes.  LiMinette [phone]/10/2013 9:00 AM

## 2014-01-31 ENCOUNTER — Other Ambulatory Visit: Payer: Self-pay

## 2014-01-31 ENCOUNTER — Other Ambulatory Visit: Payer: Self-pay | Admitting: *Deleted

## 2014-01-31 ENCOUNTER — Other Ambulatory Visit: Payer: Self-pay | Admitting: Adult Health

## 2014-02-01 ENCOUNTER — Encounter (HOSPITAL_COMMUNITY)
Admission: RE | Admit: 2014-02-01 | Discharge: 2014-02-01 | Disposition: A | Discharge status: Home / Self Care | Payer: Medicaid Other | Source: Ambulatory Visit | Attending: Ophthalmology | Admitting: Ophthalmology

## 2014-02-01 MED ORDER — FENTANYL CITRATE 0.05 MG/ML IJ SOLN: 25.0000 ug | INTRAMUSCULAR | Status: DC | PRN

## 2014-02-01 MED ORDER — ONDANSETRON HCL 4 MG/2ML IJ SOLN: 4.0000 mg | Freq: Once | INTRAMUSCULAR | Status: AC | PRN

## 2014-02-02 ENCOUNTER — Encounter: Payer: Self-pay | Admitting: Oncology

## 2014-02-03 ENCOUNTER — Encounter: Payer: Self-pay | Admitting: Cardiology

## 2014-02-03 ENCOUNTER — Encounter (HOSPITAL_COMMUNITY): Payer: Self-pay

## 2014-02-03 MED ORDER — INV-EVEROLIMUS (RAD0001) 5MG TABLET NOVARTIS CRAD001Y24135: 2.0000 | ORAL_TABLET | Freq: Every day | ORAL | Status: DC

## 2014-02-07 MED ORDER — PHENYLEPHRINE HCL 2.5 % OP SOLN
OPHTHALMIC | Status: AC
  Filled 2014-02-07: qty 15

## 2014-02-07 MED ORDER — CYCLOPENTOLATE-PHENYLEPHRINE OP SOLN OPTIME - NO CHARGE
OPHTHALMIC | Status: AC
  Filled 2014-02-07: qty 2

## 2014-02-07 MED ORDER — KETOROLAC TROMETHAMINE 0.5 % OP SOLN
OPHTHALMIC | Status: AC
  Filled 2014-02-07: qty 5

## 2014-02-07 MED ORDER — TETRACAINE HCL 0.5 % OP SOLN
OPHTHALMIC | Status: AC
  Filled 2014-02-07: qty 2

## 2014-02-08 ENCOUNTER — Encounter (HOSPITAL_COMMUNITY): Payer: Medicare Other | Admitting: Anesthesiology

## 2014-02-08 ENCOUNTER — Encounter (HOSPITAL_COMMUNITY)
Admission: RE | Disposition: A | Discharge status: Home / Self Care | Payer: Self-pay | Source: Ambulatory Visit | Attending: Ophthalmology

## 2014-02-08 ENCOUNTER — Encounter (HOSPITAL_COMMUNITY): Payer: Self-pay | Admitting: *Deleted

## 2014-02-08 ENCOUNTER — Ambulatory Visit (HOSPITAL_COMMUNITY)
Admission: RE | Admit: 2014-02-08 | Discharge: 2014-02-08 | Disposition: A | Discharge status: Home / Self Care | Payer: Medicare Other | Source: Ambulatory Visit | Attending: Ophthalmology | Admitting: Ophthalmology

## 2014-02-08 ENCOUNTER — Ambulatory Visit (HOSPITAL_COMMUNITY): Payer: Medicare Other | Admitting: Anesthesiology

## 2014-02-08 DIAGNOSIS — Z7982 Long term (current) use of aspirin: Secondary | ICD-10-CM | POA: Diagnosis not present

## 2014-02-08 DIAGNOSIS — E7800 Pure hypercholesterolemia, unspecified: Secondary | ICD-10-CM | POA: Diagnosis not present

## 2014-02-08 DIAGNOSIS — Z87891 Personal history of nicotine dependence: Secondary | ICD-10-CM | POA: Diagnosis not present

## 2014-02-08 DIAGNOSIS — H2510 Age-related nuclear cataract, unspecified eye: Secondary | ICD-10-CM | POA: Diagnosis not present

## 2014-02-08 DIAGNOSIS — H26019 Infantile and juvenile cortical, lamellar, or zonular cataract, unspecified eye: Secondary | ICD-10-CM | POA: Diagnosis not present

## 2014-02-08 DIAGNOSIS — IMO0002 Reserved for concepts with insufficient information to code with codable children: Secondary | ICD-10-CM | POA: Diagnosis not present

## 2014-02-08 DIAGNOSIS — H269 Unspecified cataract: Secondary | ICD-10-CM | POA: Diagnosis not present

## 2014-02-08 HISTORY — PX: CATARACT EXTRACTION W/PHACO: SHX586

## 2014-02-08 SURGERY — PHACOEMULSIFICATION, CATARACT, WITH IOL INSERTION
Anesthesia: Monitor Anesthesia Care | Site: Eye | Laterality: Right

## 2014-02-08 MED ORDER — KETOROLAC TROMETHAMINE 0.5 % OP SOLN
1.0000 [drp] | OPHTHALMIC | Status: AC
  Administered 2014-02-08 (×3): 1 [drp] via OPHTHALMIC

## 2014-02-08 MED ORDER — MIDAZOLAM HCL 2 MG/2ML IJ SOLN
1.0000 mg | INTRAMUSCULAR | Status: DC | PRN
  Administered 2014-02-08: 2 mg via INTRAVENOUS

## 2014-02-08 MED ORDER — PROVISC 10 MG/ML IO SOLN
INTRAOCULAR | Status: DC | PRN
  Administered 2014-02-08: 0.85 mL via INTRAOCULAR

## 2014-02-08 MED ORDER — MIDAZOLAM HCL 2 MG/2ML IJ SOLN
INTRAMUSCULAR | Status: AC
  Filled 2014-02-08: qty 2

## 2014-02-08 MED ORDER — FENTANYL CITRATE 0.05 MG/ML IJ SOLN: 25.0000 ug | INTRAMUSCULAR | Status: DC | PRN

## 2014-02-08 MED ORDER — BSS IO SOLN
INTRAOCULAR | Status: DC | PRN
  Administered 2014-02-08: 15 mL via INTRAOCULAR

## 2014-02-08 MED ORDER — CYCLOPENTOLATE-PHENYLEPHRINE 0.2-1 % OP SOLN
1.0000 [drp] | OPHTHALMIC | Status: AC
  Administered 2014-02-08 (×3): 1 [drp] via OPHTHALMIC

## 2014-02-08 MED ORDER — EPINEPHRINE HCL 1 MG/ML IJ SOLN
INTRAOCULAR | Status: DC | PRN
  Administered 2014-02-08: 09:00:00

## 2014-02-08 MED ORDER — FENTANYL CITRATE 0.05 MG/ML IJ SOLN
25.0000 ug | INTRAMUSCULAR | Status: AC
  Administered 2014-02-08 (×2): 25 ug via INTRAVENOUS

## 2014-02-08 MED ORDER — LACTATED RINGERS IV SOLN
INTRAVENOUS | Status: DC
  Administered 2014-02-08: 1000 mL via INTRAVENOUS

## 2014-02-08 MED ORDER — TETRACAINE HCL 0.5 % OP SOLN
1.0000 [drp] | OPHTHALMIC | Status: AC
  Administered 2014-02-08 (×3): 1 [drp] via OPHTHALMIC

## 2014-02-08 MED ORDER — TETRACAINE 0.5 % OP SOLN OPTIME - NO CHARGE
OPHTHALMIC | Status: DC | PRN
  Administered 2014-02-08: 2 [drp] via OPHTHALMIC

## 2014-02-08 MED ORDER — FENTANYL CITRATE 0.05 MG/ML IJ SOLN
INTRAMUSCULAR | Status: AC
  Filled 2014-02-08: qty 2

## 2014-02-08 MED ORDER — ONDANSETRON HCL 4 MG/2ML IJ SOLN: 4.0000 mg | Freq: Once | INTRAMUSCULAR | Status: AC | PRN

## 2014-02-08 MED ORDER — EPINEPHRINE HCL 1 MG/ML IJ SOLN
INTRAMUSCULAR | Status: AC
Start: 2014-02-08 — End: 2014-02-08
  Filled 2014-02-08: qty 1

## 2014-02-08 MED ORDER — PHENYLEPHRINE HCL 2.5 % OP SOLN
1.0000 [drp] | OPHTHALMIC | Status: AC
  Administered 2014-02-08 (×3): 1 [drp] via OPHTHALMIC

## 2014-02-08 SURGICAL SUPPLY — 28 items
CAPSULAR TENSION RING-AMO (OPHTHALMIC RELATED) IMPLANT
CLOTH BEACON ORANGE TIMEOUT ST (SAFETY) ×3 IMPLANT
EYE SHIELD UNIVERSAL CLEAR (GAUZE/BANDAGES/DRESSINGS) ×3 IMPLANT
GLOVE BIO SURGEON STRL SZ 6.5 (GLOVE) IMPLANT
GLOVE BIO SURGEONS STRL SZ 6.5 (GLOVE)
GLOVE BIOGEL PI IND STRL 7.0 (GLOVE) ×2 IMPLANT
GLOVE BIOGEL PI IND STRL 7.5 (GLOVE) ×1 IMPLANT
GLOVE BIOGEL PI INDICATOR 7.0 (GLOVE) ×4
GLOVE BIOGEL PI INDICATOR 7.5 (GLOVE) ×2
GLOVE ECLIPSE 6.5 STRL STRAW (GLOVE) ×3 IMPLANT
GLOVE ECLIPSE 7.0 STRL STRAW (GLOVE) IMPLANT
GLOVE EXAM NITRILE LRG STRL (GLOVE) IMPLANT
GLOVE EXAM NITRILE MD LF STRL (GLOVE) IMPLANT
GLOVE SKINSENSE NS SZ6.5 (GLOVE)
GLOVE SKINSENSE STRL SZ6.5 (GLOVE) IMPLANT
HEALON 5 0.6 ML (INTRAOCULAR LENS) IMPLANT
KIT VITRECTOMY (OPHTHALMIC RELATED) IMPLANT
PAD ARMBOARD 7.5X6 YLW CONV (MISCELLANEOUS) ×3 IMPLANT
PROC W NO LENS (INTRAOCULAR LENS)
PROC W SPEC LENS (INTRAOCULAR LENS)
PROCESS W NO LENS (INTRAOCULAR LENS) IMPLANT
PROCESS W SPEC LENS (INTRAOCULAR LENS) IMPLANT
RING MALYGIN (MISCELLANEOUS) IMPLANT
SIGHTPATH CAT PROC W REG LENS (Ophthalmic Related) ×3 IMPLANT
TAPE SURG TRANSPORE 1 IN (GAUZE/BANDAGES/DRESSINGS) ×1 IMPLANT
TAPE SURGICAL TRANSPORE 1 IN (GAUZE/BANDAGES/DRESSINGS) ×2
VISCOELASTIC ADDITIONAL (OPHTHALMIC RELATED) IMPLANT
WATER STERILE IRR 250ML POUR (IV SOLUTION) ×3 IMPLANT

## 2014-02-08 NOTE — Anesthesia Procedure Notes (Signed)
Procedure Name: MAC Date/Time: 02/08/2014 8:38 AM Performed by: Vista Deck Pre-anesthesia Checklist: Patient identified, Emergency Drugs available, Suction available, Timeout performed and Patient being monitored Patient Re-evaluated:Patient Re-evaluated prior to inductionOxygen Delivery Method: Nasal Cannula

## 2014-02-08 NOTE — H&P (Signed)
The patient was re examined and there is no change in the patients condition since the original H and P. 

## 2014-02-08 NOTE — Transfer of Care (Signed)
Immediate Anesthesia Transfer of Care Note  Patient: Barbara Parks  Procedure(s) Performed: Procedure(s) (LRB): CATARACT EXTRACTION PHACO AND INTRAOCULAR LENS PLACEMENT (IOC) (Right)  Patient Location: Shortstay  Anesthesia Type: MAC  Level of Consciousness: awake  Airway & Oxygen Therapy: Patient Spontanous Breathing   Post-op Assessment: Report given to PACU RN, Post -op Vital signs reviewed and stable and Patient moving all extremities  Post vital signs: Reviewed and stable  Complications: No apparent anesthesia complications

## 2014-02-08 NOTE — Discharge Instructions (Signed)
BRUCHY MIKEL  02/08/2014           [facility] Instructions [facility] [address]-      1. Avoid closing eyes tightly. One often closes the eye tightly when laughing, talking, sneezing, coughing or if they feel irritated. At these times, you should be careful not to close your eyes tightly.  2. Instill eye drops as instructed. To instill drops in your eye, open it, look up and have someone gently pull the lower lid down and instill a couple of drops inside the lower lid.  3. Do not touch upper lid.  4. Take Advil or Tylenol for pain.  5. You may use either eye for near work, such as reading or sewing and you may watch television.  6. You may have your hair done at the beauty parlor at any time.  7. Wear dark glasses with or without your own glasses if you are in bright light.  8. Call our office at [phone] or [phone] if you have sharp pain in your eye or unusual symptoms.  9. Do not be concerned because vision in the operative eye is not good. It will not be good, no matter how successful the operation, until you get a special lens for it. Your old glasses will not be suited to the new eye that was operated on and you will not be ready for a new lens for about a month.  10. Follow up at the [facility] office.    I have received a copy of the above instructions and will follow them.

## 2014-02-08 NOTE — Anesthesia Postprocedure Evaluation (Signed)
  Anesthesia Post-op Note  Patient: Barbara Parks  Procedure(s) Performed: Procedure(s) (LRB): CATARACT EXTRACTION PHACO AND INTRAOCULAR LENS PLACEMENT (IOC) (Right)  Patient Location:  Short Stay  Anesthesia Type: MAC  Level of Consciousness: awake  Airway and Oxygen Therapy: Patient Spontanous Breathing  Post-op Pain: none  Post-op Assessment: Post-op Vital signs reviewed, Patient's Cardiovascular Status Stable, Respiratory Function Stable, Patent Airway, No signs of Nausea or vomiting and Pain level controlled  Post-op Vital Signs: Reviewed and stable  Complications: No apparent anesthesia complications

## 2014-02-08 NOTE — Op Note (Signed)
Patient brought to the operating room and prepped and draped in the usual manner.  Lid speculum inserted in right eye.  Stab incision made at the twelve o'clock position.  Provisc instilled in the anterior chamber.   A 2.4 mm. Stab incision was made temporally.  An anterior capsulotomy was done with a bent 25 gauge needle.  The nucleus was hydrodissected.  The Phaco tip was inserted in the anterior chamber and the nucleus was emulsified.  CDE was 4.41.  The cortical material was then removed with the I and A tip.  Posterior capsule was the polished.  The anterior chamber was deepened with Provisc.  A 21.5 Alcon [id number]  IOL was then inserted in the capsular bag.  Provisc was then removed with the I and A tip.  The wound was then hydrated.  Patient sent to the Recovery Room in good condition with follow up in my office.  Preoperative Diagnosis:  Nuclear Cataract OD Postoperative Diagnosis:  Same Procedure name: Kelman Phacoemulsification OD with IOL

## 2014-02-08 NOTE — Anesthesia Preprocedure Evaluation (Signed)
Anesthesia Evaluation  Patient identified by MRN, date of birth, ID band Patient awake    Reviewed: Allergy & Precautions, H&P , NPO status , Patient's Chart, lab work & pertinent test results  Airway Mallampati: II TM Distance: <3 FB Neck ROM: Full    Dental  (+) Teeth Intact, Dental Advisory Given   Pulmonary shortness of breath, at rest and lying, asthma , former smoker,  Lung mets  breath sounds clear to auscultation        Cardiovascular negative cardio ROS  Rhythm:regular Rate:Normal     Neuro/Psych  Headaches,    GI/Hepatic   Endo/Other    Renal/GU      Musculoskeletal  (+) Arthritis -,   Abdominal   Peds  Hematology   Anesthesia Other Findings   Reproductive/Obstetrics                           Anesthesia Physical Anesthesia Plan  ASA: IV  Anesthesia Plan: MAC   Post-op Pain Management:    Induction: Intravenous  Airway Management Planned: Nasal Cannula  Additional Equipment:   Intra-op Plan:   Post-operative Plan:   Informed Consent: I have reviewed the patients History and Physical, chart, labs and discussed the procedure including the risks, benefits and alternatives for the proposed anesthesia with the patient or authorized representative who has indicated his/her understanding and acceptance.     Plan Discussed with:   Anesthesia Plan Comments:         Anesthesia Quick Evaluation

## 2014-02-09 ENCOUNTER — Encounter (HOSPITAL_COMMUNITY): Payer: Self-pay | Admitting: Ophthalmology

## 2014-02-22 ENCOUNTER — Other Ambulatory Visit: Payer: Self-pay

## 2014-02-22 ENCOUNTER — Telehealth: Payer: Self-pay | Admitting: Oncology

## 2014-02-22 ENCOUNTER — Other Ambulatory Visit: Payer: Self-pay | Admitting: *Deleted

## 2014-02-22 DIAGNOSIS — C50919 Malignant neoplasm of unspecified site of unspecified female breast: Secondary | ICD-10-CM

## 2014-02-22 NOTE — Telephone Encounter (Signed)
per pof & Terri-8:30 am 7/29 cleared w/GM-sch appts per pof CX 7/30 appts-have note on file for 7/29-pt aware of appts & labs

## 2014-02-22 NOTE — Progress Notes (Signed)
Through Barbara Parks - pt mother actively dying - pt need to travel ASAP and would like to change her appt to 7/29.  Appt changed - pt advised new d/t and voiced understanding.  POF entered and note given to scheduling.

## 2014-02-23 ENCOUNTER — Telehealth: Payer: Self-pay | Admitting: Oncology

## 2014-02-23 ENCOUNTER — Other Ambulatory Visit (HOSPITAL_BASED_OUTPATIENT_CLINIC_OR_DEPARTMENT_OTHER): Payer: Medicare Other

## 2014-02-23 ENCOUNTER — Other Ambulatory Visit: Payer: Self-pay | Admitting: Oncology

## 2014-02-23 ENCOUNTER — Other Ambulatory Visit: Payer: Self-pay | Admitting: *Deleted

## 2014-02-23 ENCOUNTER — Encounter: Payer: Self-pay | Admitting: Oncology

## 2014-02-23 ENCOUNTER — Encounter: Payer: Self-pay | Admitting: *Deleted

## 2014-02-23 ENCOUNTER — Ambulatory Visit (HOSPITAL_BASED_OUTPATIENT_CLINIC_OR_DEPARTMENT_OTHER): Payer: Medicare Other | Admitting: Oncology

## 2014-02-23 VITALS — BP 112/77 | HR 94 | Temp 98.1°F | Resp 18 | Ht 65.0 in | Wt 173.9 lb

## 2014-02-23 DIAGNOSIS — C50919 Malignant neoplasm of unspecified site of unspecified female breast: Secondary | ICD-10-CM

## 2014-02-23 DIAGNOSIS — Z1231 Encounter for screening mammogram for malignant neoplasm of breast: Secondary | ICD-10-CM

## 2014-02-23 DIAGNOSIS — Z9011 Acquired absence of right breast and nipple: Secondary | ICD-10-CM

## 2014-02-23 DIAGNOSIS — C50419 Malignant neoplasm of upper-outer quadrant of unspecified female breast: Secondary | ICD-10-CM

## 2014-02-23 DIAGNOSIS — Z170 Estrogen receptor positive status [ER+]: Secondary | ICD-10-CM

## 2014-02-23 DIAGNOSIS — C7951 Secondary malignant neoplasm of bone: Secondary | ICD-10-CM

## 2014-02-23 DIAGNOSIS — C787 Secondary malignant neoplasm of liver and intrahepatic bile duct: Secondary | ICD-10-CM

## 2014-02-23 DIAGNOSIS — C7952 Secondary malignant neoplasm of bone marrow: Secondary | ICD-10-CM

## 2014-02-23 DIAGNOSIS — F909 Attention-deficit hyperactivity disorder, unspecified type: Secondary | ICD-10-CM | POA: Insufficient documentation

## 2014-02-23 LAB — CBC WITH DIFFERENTIAL/PLATELET
BASO%: 1.5 % (ref 0.0–2.0)
Basophils Absolute: 0.1 10*3/uL (ref 0.0–0.1)
EOS ABS: 0.1 10*3/uL (ref 0.0–0.5)
EOS%: 1.7 % (ref 0.0–7.0)
HCT: 40.6 % (ref 34.8–46.6)
HGB: 13.6 g/dL (ref 11.6–15.9)
LYMPH%: 11.8 % — AB (ref 14.0–49.7)
MCH: 26.2 pg (ref 25.1–34.0)
MCHC: 33.4 g/dL (ref 31.5–36.0)
MCV: 78.3 fL — ABNORMAL LOW (ref 79.5–101.0)
MONO#: 0.8 10*3/uL (ref 0.1–0.9)
MONO%: 9.8 % (ref 0.0–14.0)
NEUT%: 75.2 % (ref 38.4–76.8)
NEUTROS ABS: 6.4 10*3/uL (ref 1.5–6.5)
PLATELETS: 309 10*3/uL (ref 145–400)
RBC: 5.19 10*6/uL (ref 3.70–5.45)
RDW: 13.8 % (ref 11.2–14.5)
WBC: 8.5 10*3/uL (ref 3.9–10.3)
lymph#: 1 10*3/uL (ref 0.9–3.3)

## 2014-02-23 LAB — LIPID PANEL
Cholesterol: 240 mg/dL — ABNORMAL HIGH (ref 0–200)
HDL: 48 mg/dL (ref >39)
LDL CALC: 139 mg/dL — AB (ref 0–99)
TRIGLYCERIDES: 265 mg/dL — AB (ref <150)
Total CHOL/HDL Ratio: 5 Ratio
VLDL: 53 mg/dL — ABNORMAL HIGH (ref 0–40)

## 2014-02-23 LAB — COMPREHENSIVE METABOLIC PANEL (CC13)
ALK PHOS: 237 U/L — AB (ref 40–150)
ALT: 33 U/L (ref 0–55)
AST: 32 U/L (ref 5–34)
Albumin: 3.5 g/dL (ref 3.5–5.0)
Anion Gap: 13 mEq/L — ABNORMAL HIGH (ref 3–11)
BUN: 19.3 mg/dL (ref 7.0–26.0)
CO2: 21 mEq/L — ABNORMAL LOW (ref 22–29)
Calcium: 10.1 mg/dL (ref 8.4–10.4)
Chloride: 108 mEq/L (ref 98–109)
Creatinine: 0.9 mg/dL (ref 0.6–1.1)
GLUCOSE: 150 mg/dL — AB (ref 70–140)
Potassium: 3.9 mEq/L (ref 3.5–5.1)
Sodium: 142 mEq/L (ref 136–145)
TOTAL PROTEIN: 7.9 g/dL (ref 6.4–8.3)
Total Bilirubin: 0.33 mg/dL (ref 0.20–1.20)

## 2014-02-23 LAB — PHOSPHORUS: Phosphorus: 3.2 mg/dL (ref 2.3–4.6)

## 2014-02-23 LAB — HEMOGLOBIN A1C
Hgb A1c MFr Bld: 8.1 % — ABNORMAL HIGH (ref <5.7)
Mean Plasma Glucose: 186 mg/dL — ABNORMAL HIGH (ref <117)

## 2014-02-23 LAB — CK: CK TOTAL: 72 U/L (ref 7–177)

## 2014-02-23 LAB — URIC ACID (CC13): Uric Acid, Serum: 3.5 mg/dl (ref 2.6–7.4)

## 2014-02-23 LAB — BILIRUBIN, DIRECT

## 2014-02-23 MED ORDER — LETROZOLE 2.5 MG PO TABS: 2.5000 mg | ORAL_TABLET | Freq: Every day | ORAL | Status: DC

## 2014-02-23 MED ORDER — INV-EVEROLIMUS (RAD0001) 5MG TABLET NOVARTIS CRAD001Y24135: 2.0000 | ORAL_TABLET | Freq: Every day | ORAL | Status: DC

## 2014-02-23 MED ORDER — GEMFIBROZIL 600 MG PO TABS: 600.0000 mg | ORAL_TABLET | Freq: Two times a day (BID) | ORAL | Status: DC

## 2014-02-23 NOTE — Telephone Encounter (Signed)
Medical records faxed to Dr. Mitzie Na @ [phone].

## 2014-02-23 NOTE — Progress Notes (Signed)
[place]  [phone] [phone]     ID: Barbara Parks DOB: June 11, 1951  MR#: [id number]  SJG#:[id number]  Patient Care Team: Marjorie Smolder, MD as PCP - General (Family Medicine) Candee Furbish, MD (Cardiology) Eston Esters, MD (Hematology and Oncology) Gaye Pollack, MD (Cardiothoracic Surgery)  OTHER MD: Dr Erroll Luna- Merlene Laughter, DDS  CHIEF COMPLAINT: metastatic breast cancer, on BOLERO study  CURRENT TREATMENT: letrozole + everolimus  BREAST CANCER HISTORY: From Dr. Collier [name]'s original intake note 04/13/2004:  "This woman has been in good health all of her life. She recently moved from [place] to work here.  She palpated a mass at about the 12 o'clock position in mid-July. She has not noticed any nipple retraction or skin changes.  She was seen by her primary care doctor who subsequently referred her for a mammogram.  Mammogram was performed on 03/01/04. This demonstrated a spiculated 2 cm mass at the 12 o'clock position in the right breast.  Upper outer quadrant of the left breast shows some distortion.   Physical exam at that time showed a firm, nontender nodule at the 12 o'clock position in the right breast, 5 cm from the nipple.  Ultrasound of this area showed a hypoechoic ill-defined mass, measuring 2.2 x 1.3 x 1.4 cm.  Physical exam of the left breast showed general vague thickening, upper outer quadrant of the left breast with a discrete palpable mass. The ultrasound performed showed a single hypoechoic ill-defined nodule at the 1 o'clock position, measuring 7 x 5 x 6 mm.  She had biopsies of both lesions on 03/02/03.  Needle core biopsy of the lesion on the right breast revealed invasive mammary carcinoma.  Needle core biopsy of the left breast showed a complex fibroadenoma.  Prognostic panel of the lesion on the right breast showed it to be ER positive at 73%, PR positive at 90% and proliferative index 9%, HER-2 was 1+.  Patient was  referred to Dr. Annamaria Boots, who performed a simple mastectomy with sentinel lymph node evaluation on 03/22/04.  Final pathology showed this to be an invasive ductal carcinoma  with lobular features, measuring 2.2 cm, grade 2 of 3.  Margins negative for carcinoma.  Invasive ductal carcinoma was extended to involve deep dermis of the nipple.  Lymphovascular invasion was identified.  Total of 4 sentinel lymph nodes were evaluated.  Touch imprints at the time of the OR was felt to be negative.  Subsequent evaluation showed a 5 mm focus of metastatic carcinoma in one of the four lymph nodes on microscopic after sectioning.  There was extracapsular extension  of one lymph node as well. The remaining three lymph nodes were all negative."  The patient's subsequent history is detailed below.  INTERVAL HISTORY: Barbara Parks returns today for followup of her metastatic breast cancer. Our research nurse Raoul Pitch was also present. The interval history is generally very stable. She continues on the [place] trial. Remarkably, her mother died just yesterday. The patient will be going to the funeral later today in [place]. Also since her last visit here Barbara Parks underwent bilateral cataract surgery, with very good results. Tajae is establishing herself on my service  REVIEW OF SYSTEMS: Barbara Parks is tolerating the Effexor or with no obvious side effects. She was having some stomach upset but that has resolved with the addition of daily your work. Because she had originally resolved within a week of starting the medication and she had some mouth sores originally but those also resolved  after a couple of months and have not recurred. She is tolerating the letrozole well, except for mild arthralgias involving both hands, the right hand more than the left. She has some hot flashes, vaginal dryness is not a major issue. Aside from these problems, she continues to have significant left hip pain, unchanged from baseline. She is working with her  dentist, Dr Merlene Laughter, in [place] and is being referred to an oral surgeon to get clearance before starting bisphosphonates. Aside from these issues, a detailed review of systems today was entirely stable  PAST MEDICAL HISTORY: Past Medical History  Diagnosis Date  . Asthma   . Shortness of breath   . Arthritis     left hip  . Contact dermatitis     from a bandaid that had Latex   . breast ca 2005    breast/chemo R mastectomy  . Metastasis to lung dx'd 08/2011  . Osteopenia due to cancer therapy 09/09/2013  . Hypercholesteremia   . ADHD (attention deficit hyperactivity disorder)     PAST SURGICAL HISTORY: Past Surgical History  Procedure Laterality Date  . Spine surgery  1996  . Breast surgery  2005    right  . Video bronchoscopy  09/11/2011    Procedure: VIDEO BRONCHOSCOPY;  Surgeon: Gaye Pollack, MD;  Location: [facility];  Service: Thoracic;  Laterality: N/A;  . Chest tube insertion  09/11/2011    Procedure: INSERTION PLEURAL DRAINAGE CATHETER;  Surgeon: Gaye Pollack, MD;  Location: [facility];  Service: Thoracic;  Laterality: Right;  . Pericardial window  09/11/2011    Procedure: PERICARDIAL WINDOW;  Surgeon: Gaye Pollack, MD;  Location: [facility];  Service: Thoracic;  Laterality: N/A;  . Removal of pleural drainage catheter  12/19/2011    Procedure: REMOVAL OF PLEURAL DRAINAGE CATHETER;  Surgeon: Gaye Pollack, MD;  Location: [facility];  Service: Thoracic;  Laterality: Right;  TO BE DONE IN MINOR ROOM, SHORT STAY  . Cataract extraction w/phaco Left 01/18/2014    Procedure: CATARACT EXTRACTION PHACO AND INTRAOCULAR LENS PLACEMENT (IOC);  Surgeon: Elta Guadeloupe T. Gershon Crane, MD;  Location: [facility];  Service: Ophthalmology;  Laterality: Left;  CDE 15.79  . Cataract extraction w/phaco Right 02/08/2014    Procedure: CATARACT EXTRACTION PHACO AND INTRAOCULAR LENS PLACEMENT (IOC);  Surgeon: Elta Guadeloupe T. Gershon Crane, MD;  Location: [facility];  Service: Ophthalmology;  Laterality: Right;  CDE 4.41    FAMILY  HISTORY Family History  Problem Relation Age of Onset  . Cancer Mother     breast  . Heart disease Father   . Anesthesia problems Neg Hx    the patient's mother was diagnosed with breast cancer at age 69, she died age 11 from congestive heart failure..The patient's father died from heart disease at age 24.  She has one sister alive & well.  Two brothers alive & well, one with diabetes. The patient's sister was also diagnosed with breast cancer, and was tested for the BRCA gene, and was negative. The patient herself has not been tested. There is no history of ovarian cancer in the family  GYNECOLOGIC HISTORY:  No LMP recorded. Patient is postmenopausal. Menarche age 60, the patient is GX P0. She stopped having periods with chemotherapy in 2005. 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 She was a Archivist. She is single, lives alone with her Shitzu-poodle Sammie.. Family is  all in the [place] area.     ADVANCED DIRECTIVES: Not in place. At the 02/23/2014 visit the patient was given the appropriate documents to complete and notarize at her discretion. She tells me she is planning to name her sister, Barbara Parks, as healthcare power of attorney. [name] can be reached at [phone]   HEALTH MAINTENANCE: History  Substance Use Topics  . Smoking status: Former Smoker -- 1.50 packs/day for 30 years    Types: Cigarettes    Quit date: 09/10/2007  . Smokeless tobacco: Not on file  . Alcohol Use: No     Colonoscopy:  PAP:  Bone density: 08/11/2013, was normal  Lipid panel:  Allergies  Allergen Reactions  . Aspirin     REACTION: upset stomach  . Latex Other (See Comments)    Blistering and skin peels off  . Nsaids Nausea And Vomiting    Extreme nausea and vomiting    Current Outpatient Prescriptions  Medication Sig Dispense Refill  . Bromfenac Sodium  (PROLENSA) 0.07 % SOLN Apply 1 drop to eye every morning. Pt instills 1 drop to left eye daily.      . cholecalciferol (VITAMIN D) 1000 UNITS tablet Take 2,000 Units by mouth daily.       Marland Kitchen gemfibrozil (LOPID) 600 MG tablet Take 1 tablet (600 mg total) by mouth 2 (two) times daily before a meal.  90 tablet  0  . ibuprofen (ADVIL,MOTRIN) 200 MG tablet Take 400 mg by mouth every 6 (six) hours as needed. For pain      . Investigational everolimus (RAD001) 5 MG tablet Novartis [id number] Take 2 tablets by mouth daily. Take with a glass of water.  70 tablet  0  . Investigational everolimus (RAD001) 5 MG tablet Novartis [id number] Take 2 tablets by mouth daily. Take with a glass of water.  70 tablet  0  . letrozole (FEMARA) 2.5 MG tablet Take 1 tablet (2.5 mg total) by mouth daily.  90 tablet  4  . loperamide (IMODIUM) 2 MG capsule Take 2 mg by mouth 4 (four) times daily as needed for diarrhea or loose stools.      . metoprolol tartrate (LOPRESSOR) 25 MG tablet Take 0.5 tablets (12.5 mg total) by mouth daily.  30 tablet  4   No current facility-administered medications for this visit.    OBJECTIVE: Middle-aged white woman in no acute distress Filed Vitals:   02/23/14 0901  BP: 112/77  Pulse:   Temp:   Resp:      Body mass index is 28.94 kg/(m^2).    ECOG FS:1 - Symptomatic but completely ambulatory  Ocular: Sclerae unicteric, EOMs intact Ear-nose-throat: Oropharynx clear and moist Lymphatic: No cervical or supraclavicular adenopathy Lungs no rales or rhonchi, good excursion bilaterally Heart regular rate and rhythm, no murmur appreciated Abd soft, nontender, positive bowel sounds MSK no focal spinal tenderness, no joint edema Neuro: non-focal, well-oriented, appropriate affect Breasts: The right breast is status post mastectomy. There is no evidence of local recurrence. The right axilla is benign. Left breast is unremarkable   LAB RESULTS:  CMP     Component Value Date/Time   NA  140 01/27/2014 0837   NA 136 07/16/2012 0910   K 4.0 01/27/2014 0837   K 4.0 07/16/2012 0910   CL 109* 12/31/2012 0940   CL 103 07/16/2012 0910   CO2 19* 01/27/2014 0837   CO2 21 07/16/2012 0910   GLUCOSE 150* 01/27/2014 0837   GLUCOSE 127* 12/31/2012 0940  GLUCOSE 140* 07/16/2012 0910   BUN 17.0 01/27/2014 0837   BUN 16 07/16/2012 0910   CREATININE 0.9 01/27/2014 0837   CREATININE 0.75 07/16/2012 0910   CALCIUM 10.3 01/27/2014 0837   CALCIUM 10.1 07/16/2012 0910   PROT 7.9 01/27/2014 0837   PROT 8.0 07/16/2012 0910   ALBUMIN 3.5 01/27/2014 0837   ALBUMIN 3.7 07/16/2012 0910   AST 44* 01/27/2014 0837   AST 30 07/16/2012 0910   ALT 44 01/27/2014 0837   ALT 33 07/16/2012 0910   ALKPHOS 265* 01/27/2014 0837   ALKPHOS 142* 07/16/2012 0910   BILITOT 0.33 01/27/2014 0837   BILITOT 0.2* 07/16/2012 0910   GFRNONAA >90 09/13/2011 0440   GFRAA >90 09/13/2011 0440    I No results found for this basename: SPEP,  UPEP,   kappa and lambda light chains    Lab Results  Component Value Date   WBC 6.2 01/27/2014   NEUTROABS 4.5 01/27/2014   HGB 13.7 01/27/2014   HCT 41.7 01/27/2014   MCV 80.0 01/27/2014   PLT 294 01/27/2014      Chemistry      Component Value Date/Time   NA 140 01/27/2014 0837   NA 136 07/16/2012 0910   K 4.0 01/27/2014 0837   K 4.0 07/16/2012 0910   CL 109* 12/31/2012 0940   CL 103 07/16/2012 0910   CO2 19* 01/27/2014 0837   CO2 21 07/16/2012 0910   BUN 17.0 01/27/2014 0837   BUN 16 07/16/2012 0910   CREATININE 0.9 01/27/2014 0837   CREATININE 0.75 07/16/2012 0910      Component Value Date/Time   CALCIUM 10.3 01/27/2014 0837   CALCIUM 10.1 07/16/2012 0910   ALKPHOS 265* 01/27/2014 0837   ALKPHOS 142* 07/16/2012 0910   AST 44* 01/27/2014 0837   AST 30 07/16/2012 0910   ALT 44 01/27/2014 0837   ALT 33 07/16/2012 0910   BILITOT 0.33 01/27/2014 0837   BILITOT 0.2* 07/16/2012 0910       Lab Results  Component Value Date   LABCA2 58* 09/14/2012    No components found with this basename: [id number]    No  results found for this basename: INR,  in the last 168 hours  Urinalysis    Component Value Date/Time   COLORURINE YELLOW 09/11/2011 [place] 09/11/2011 0819   LABSPEC 1.030 11/14/2011 1101   LABSPEC 1.013 09/11/2011 0819   PHURINE 6.5 09/11/2011 0819   GLUCOSEU NEGATIVE 09/11/2011 0819   HGBUR SMALL* 09/11/2011 0819   BILIRUBINUR NEGATIVE 09/11/2011 0819   KETONESUR 15* 09/11/2011 0819   PROTEINUR NEGATIVE 09/11/2011 0819   UROBILINOGEN 0.2 09/11/2011 0819   NITRITE NEGATIVE 09/11/2011 0819   LEUKOCYTESUR NEGATIVE 09/11/2011 0819    STUDIES: Ct Chest W Contrast  01/25/2014   CLINICAL DATA:  Right breast cancer with metastatic disease to the lung, brain and bone. Restaging post oral chemotherapy.  EXAM: CT CHEST, ABDOMEN, AND PELVIS WITH CONTRAST  TECHNIQUE: Multidetector CT imaging of the chest, abdomen and pelvis was performed following the standard protocol during bolus administration of intravenous contrast.  CONTRAST:  75m OMNIPAQUE IOHEXOL 300 MG/ML SOLN, 1081mOMNIPAQUE IOHEXOL 300 MG/ML SOLN  COMPARISON:  Prior examinations 11/30/2013 and 10/05/2013.  FINDINGS: RECIST 1.1  Target Lesions:  1. Right lower lobe pulmonary nodule measures 6 mm on image 38, unchanged. 2. Partially calcified subcarinal lymph node measures 7 mm on image 28, unchanged.  3. Right hilar lymph node measures 4 mm on image 26, previously  5 mm. Non-target Lesions:  1. Non measurable. 2.  CT CHEST FINDINGS  There are stable postsurgical changes status post right mastectomy and axillary node dissection. There are no enlarged axillary, internal mammary, mediastinal or hilar lymph nodes.  The heart size is stable. There is stable mild atherosclerosis. No pleural or pericardial effusion is present.  The lungs appear stable with linear right lower lobe scarring and a 6 mm subpleural nodule on image 38 (noted above). No new or enlarging nodules are demonstrated.  CT ABDOMEN AND PELVIS FINDINGS  The liver has a stable  appearance with steatosis and probable focal sparing around the gallbladder and posteriorly in the right hepatic lobe (image 53). No new or enlarging liver lesions are identified. Nodularity gallbladder wall appears stable.  The spleen, pancreas, adrenal glands and kidneys appear normal.  The stomach, small bowel, appendix and colon demonstrate no significant findings. There is stable aortoiliac atherosclerosis. There is no adenopathy or ascites. The bladder, uterus and ovaries appear unchanged.  There are stable sclerotic lesions within the left iliac bone and sacrum, probably partially treated metastases. Advanced left hip osteoarthritis again noted.  IMPRESSION: 1. No residual measurable extra osseous metastatic disease within the chest, abdomen or pelvis. 2. Stable sclerotic lesions within the left iliac bone and sacrum. 3. No evidence of local recurrence. 4. Stable heterogeneous hepatic steatosis.   Electronically Signed   By: [name] M.D.   On: 01/25/2014 14:05   Ct Abdomen Pelvis W Contrast  01/25/2014   CLINICAL DATA:  Right breast cancer with metastatic disease to the lung, brain and bone. Restaging post oral chemotherapy.  EXAM: CT CHEST, ABDOMEN, AND PELVIS WITH CONTRAST  TECHNIQUE: Multidetector CT imaging of the chest, abdomen and pelvis was performed following the standard protocol during bolus administration of intravenous contrast.  CONTRAST:  35m OMNIPAQUE IOHEXOL 300 MG/ML SOLN, 1054mOMNIPAQUE IOHEXOL 300 MG/ML SOLN  COMPARISON:  Prior examinations 11/30/2013 and 10/05/2013.  FINDINGS: RECIST 1.1  Target Lesions:  1. Right lower lobe pulmonary nodule measures 6 mm on image 38, unchanged. 2. Partially calcified subcarinal lymph node measures 7 mm on image 28, unchanged.  3. Right hilar lymph node measures 4 mm on image 26, previously 5 mm. Non-target Lesions:  1. Non measurable. 2.  CT CHEST FINDINGS  There are stable postsurgical changes status post right mastectomy and axillary node  dissection. There are no enlarged axillary, internal mammary, mediastinal or hilar lymph nodes.  The heart size is stable. There is stable mild atherosclerosis. No pleural or pericardial effusion is present.  The lungs appear stable with linear right lower lobe scarring and a 6 mm subpleural nodule on image 38 (noted above). No new or enlarging nodules are demonstrated.  CT ABDOMEN AND PELVIS FINDINGS  The liver has a stable appearance with steatosis and probable focal sparing around the gallbladder and posteriorly in the right hepatic lobe (image 53). No new or enlarging liver lesions are identified. Nodularity gallbladder wall appears stable.  The spleen, pancreas, adrenal glands and kidneys appear normal.  The stomach, small bowel, appendix and colon demonstrate no significant findings. There is stable aortoiliac atherosclerosis. There is no adenopathy or ascites. The bladder, uterus and ovaries appear unchanged.  There are stable sclerotic lesions within the left iliac bone and sacrum, probably partially treated metastases. Advanced left hip osteoarthritis again noted.  IMPRESSION: 1. No residual measurable extra osseous metastatic disease within the chest, abdomen or pelvis. 2. Stable sclerotic lesions within the left iliac bone and  sacrum. 3. No evidence of local recurrence. 4. Stable heterogeneous hepatic steatosis.   Electronically Signed   By: [name] M.D.   On: 01/25/2014 14:05    ASSESSMENT: 63 y.o. [place], [place] woman with stage IV breast cancer, on BOLERO-4 trial  (1) status post right mastectomy and sentinel lymph node sampling 03/22/2004 for a pT2 pN1, stage IIB invasive ductal carcinoma with lobular features, grade 2, estrogen receptor and progesterone receptor positive, HER-2 negative, with an MIB-1 of 9% ([id number] and [id number])  (2) addition all right axillary lymph node sampling 05/07/2004 showed 2 benign lymph nodes (4 lymph nodes previously removed, so total was one positive lymph node  out of 6; [id number])  (3) the patient was evaluated by radiation oncology; no postmastectomy radiation recommended  (4) adjuvant chemotherapy with dose dense doxorubicin and cyclophosphamide x4 cycles (first cycle delayed one week) followed by dose dense paclitaxel x4 was completed 09/18/2004  (5) tamoxifen started March 2006, discontinued 2009  METASTATIC DISEASE: (6) presenting with a large pericardial effusion, large right pleural effusion and possible right middle lobe bronchial obstruction February 2013, status post pericardial window placement, fiberoptic bronchoscopy and right Pleurx placement 09/11/2011,, with biopsy of the bronchus intermedius and pericardial positive for metastatic breast cancer, estrogen receptor 91% positive with moderate staining intensity, progesterone receptor 100% positive with strong staining intensity, with an MIB-1 of 35%, and no HER-2 amplification, the signals ratio being 1.37 ([id number])  (7) enrolled in BOLERO-4 trial 10/10/2011, receiving letrozole and everolimus  (a) two small areas of enhancement in the cerebellum noted by brain MRI 10/03/2011 were no longer apparent on repeat MRI 08/21/2012  (b) sclerotic lesions in left iliac bone and sacrum have not been biopsied; stable  (c) RLL lung nodule, R hilar and subcarinal lymph nodes: stable  (9) additional problems:  (a) hepatic steatosis  (b) COPD/ emphysema  (c) advanced L hip osteoarthritis  (d) aortoiliac atherosclerosis  (e) dental evaluation pending w possible dental extractions    PLAN: We spent approximately one hour going over Ronetta's situation in detail. The patient's was diagnosed with metastatic disease almost 2-1/2 years ago, and is having a very good continuing response to her antiestrogen plus targeted therapy. She is tolerating the treatment quite well.  Accordingly, as far as breast cancer is concerned, the plan is to continue letrozole and everolimus indefinitely, so long as there  is no evidence of disease response. We are continuing scans and visits as per the study requirements.  Barbara Parks has not had a mammogram in some time. I am scheduling her for that. I think she up to consider hip replacement. I am referring her to Dr. Gaynelle Arabian to discuss that further. She tells me her dentist from [place] he has referred her to an Chief Financial Officer. As soon as she has any teeth pulled Dabney pulling only get clearance from the oral surgeon we will be able to start bisphosphonates, which currently would be zolendronate given every 3 months.  The patient has a good understanding of the overall plan. She agrees with it. She knows the goal of treatment in her case is control. She will call with any problems that may develop before her next visit here.  Chauncey Cruel, MD   02/23/2014 9:06 AM

## 2014-02-23 NOTE — Progress Notes (Signed)
02/23/14 @ 9:00 am, Novartis [id number], BOLERO 4, Cycle 32:  Barbara Parks into the [facility] for labs, to see Dr. Jana Hakim and to obtain everolimus per protocol. She confirmed that she has been fasting for today's labs. She returned the Cycle 31 kit with 16 remaining tabs in the kit.  She reported taking both the everolimus 10 mg and letrozole 2.5 mg every day of the cycle, and the count confirms no missed doses.  Dr. Jana Hakim decided he wanted see Barbara Parks before she had labs drawn, so he did.  The CNA assessed the patient's vital  signs seated with her feet together after she had been seated for five minutes.  She took the BP three times, 1 minute between, as requested. Spoke with patient prior to MD assessment about her ongoing adverse events.  She continues to have hot flashes (no change of grade), some memory impairment (no change in grade), nausea off and on but none in the last month or so, she says she has not had any diarrhea in months since she started eating yogurt every day.  She reported no change in the arthritis in her hands (no change in grade), she continues to have fatigue (no change in grade) and says that her left hip pain is worse.  She denies having any bloating since starting the yogurt daily.  She completed the Prolensa and Oflaxcin eye drops in the left eye on 02/08/14.  She started the same drugs in her right eye on 02/06/14 and completed them on 02/22/14. Her dental work is still pending due to having bilateral cataract surgery.  She has a referral to an oral surgeon from her dentist. The pharmacy dispensed a new kit of everolimus today containing 70 tablets 5 mg each.  Barbara Parks agreed to continue to take two tabs (53m) per day by mouth as well as commercial letrezole 2.5 mg by mouth daily.   Provided her with calendars of her appointments for August and September.  She had her blood collected for laboratory studies after her MD appointment. 02/23/14 @ 3:30pm:  All lab results back.   Reviewed and found no worsening of any values.

## 2014-02-24 ENCOUNTER — Encounter: Payer: Self-pay | Admitting: *Deleted

## 2014-02-24 ENCOUNTER — Ambulatory Visit: Payer: Self-pay | Admitting: Oncology

## 2014-02-24 ENCOUNTER — Other Ambulatory Visit: Payer: Self-pay

## 2014-03-04 ENCOUNTER — Telehealth: Payer: Self-pay | Admitting: Oncology

## 2014-03-04 NOTE — Telephone Encounter (Signed)
, °

## 2014-03-05 IMAGING — MR MR HEAD WO/W CM
7 of 11 series · 27 of 48 positions shown · IV contrast (Yes)
Comparison: MRI 10/03/2011

CLINICAL DATA: Stage IV breast cancer.  Headaches.

MRI HEAD WITHOUT AND WITH CONTRAST
TECHNIQUE: Multiplanar, multiecho pulse sequences of the brain and
surrounding structures were obtained according to standard protocol
without and with intravenous contrast
Contrast: 17mL MULTIHANCE GADOBENATE DIMEGLUMINE 529 MG/ML IV SOLN

[Series 4: DWI · axial · 5.0mm · 1.09mm/px · z∈[-85,+59]mm · 8 of 60 slices shown (1 of 2)]
[im 1/60]
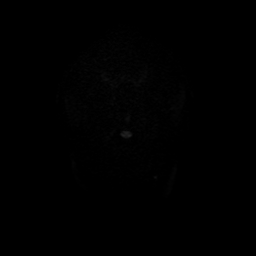
[im 9/60]
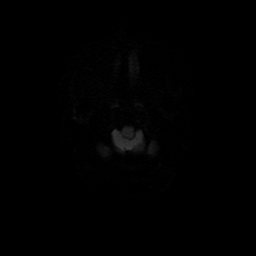
[im 17/60]
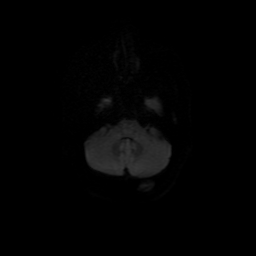
[im 26/60]
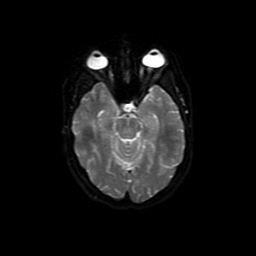
[im 34/60]
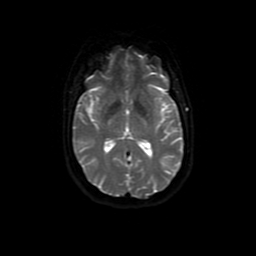
[im 43/60]
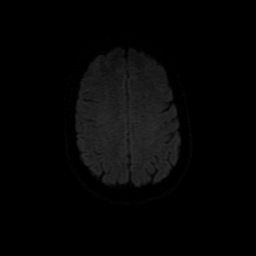
[im 51/60]
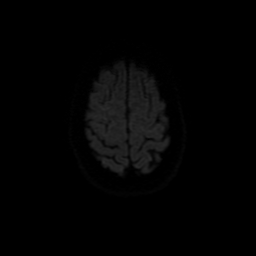
[im 60/60]
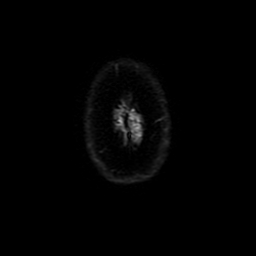

[Series 5: T2 · axial · 5.0mm · 0.43mm/px · z∈[-82,+67]mm · 3 of 24 slices shown]
[im 1/24]
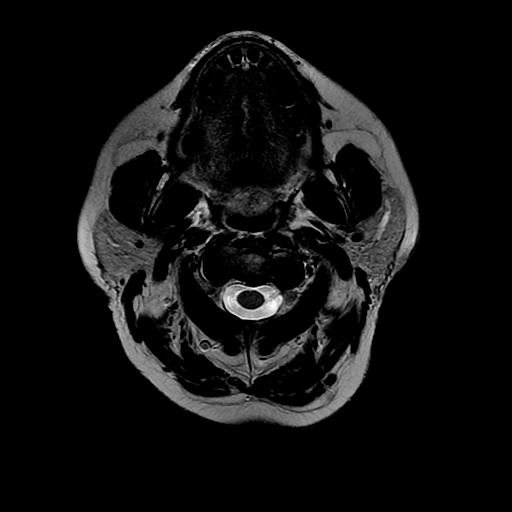
[im 12/24]
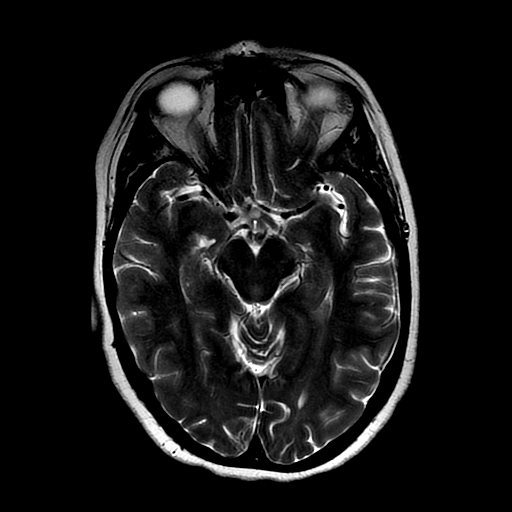
[im 24/24]
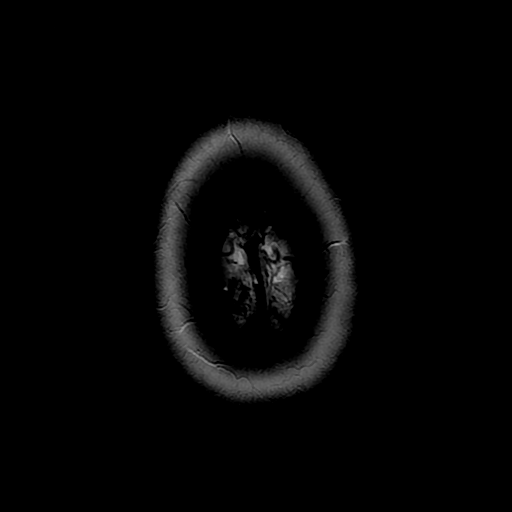

[Series 6: FLAIR · axial · 5.0mm · 0.43mm/px · z∈[-87,+73]mm · 3 of 24 slices shown]
[im 1/24]
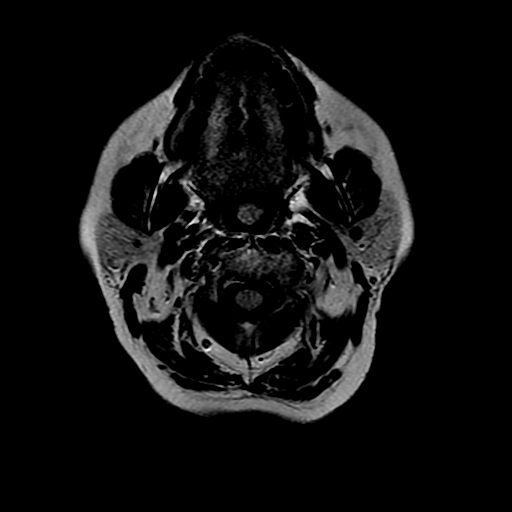
[im 12/24]
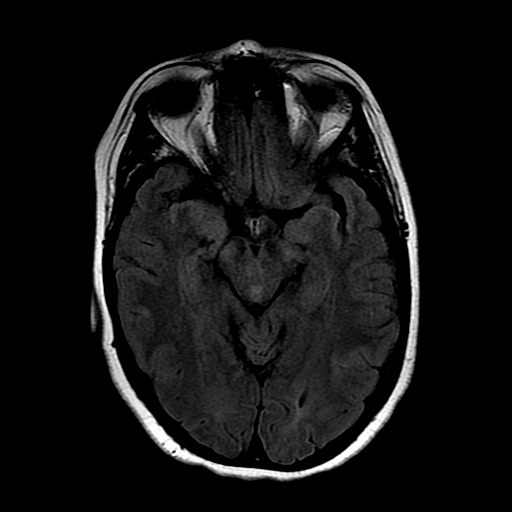
[im 24/24]
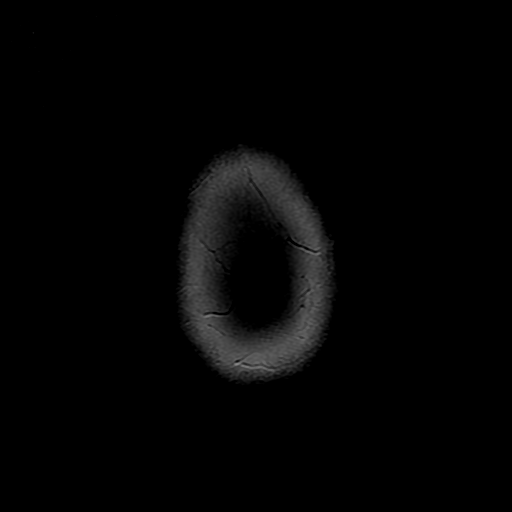

[Series 9: T2 post-contrast · coronal · 5.0mm · 0.45mm/px · 3 of 24 slices shown]
[im 1/24]
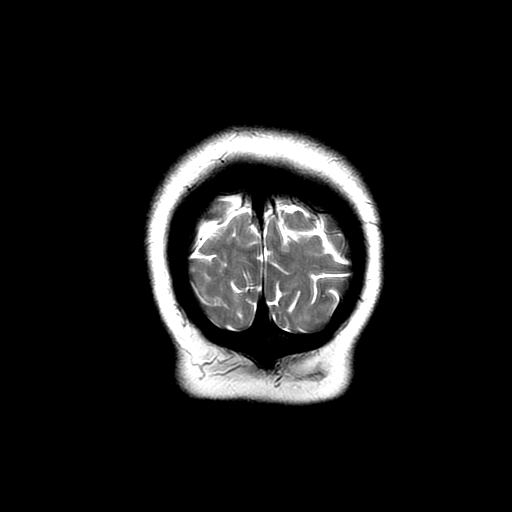
[im 12/24]
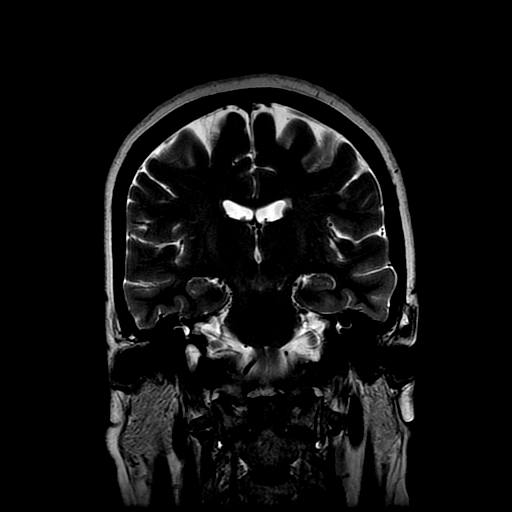
[im 24/24]
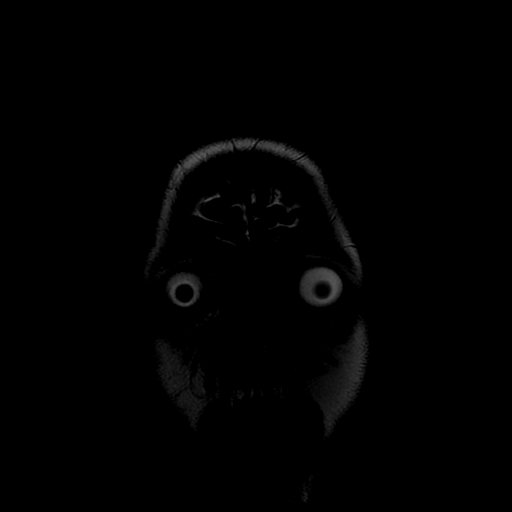

[Series 11: T1 post-contrast · coronal · 5.0mm · 0.45mm/px · 3 of 24 slices shown (1 of 2)]
[im 1/24]
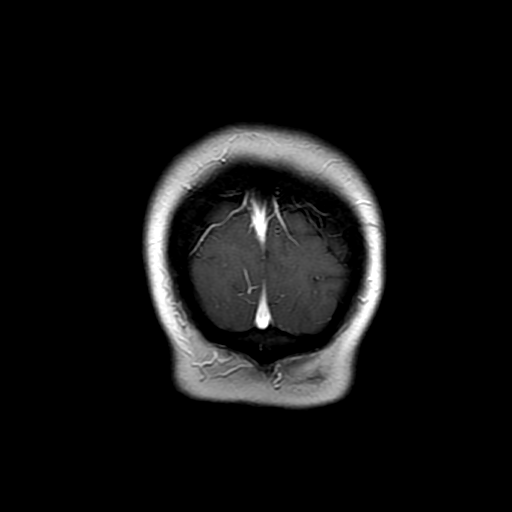
[im 12/24]
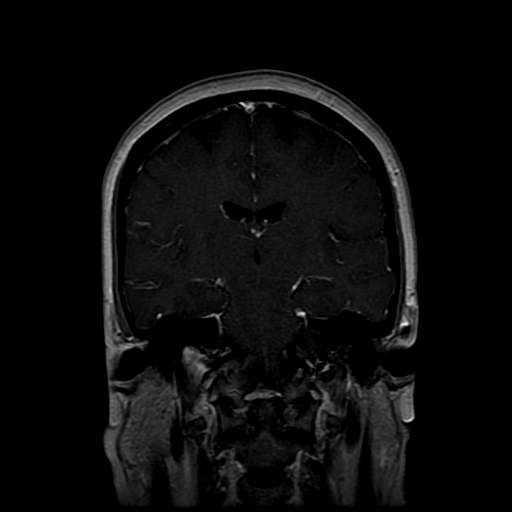
[im 24/24]
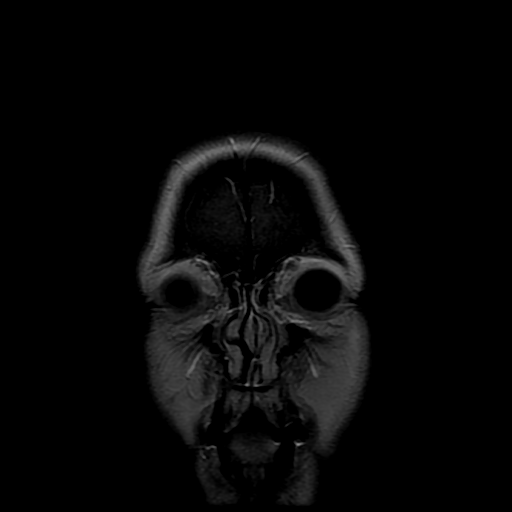

[Series 12: T1 post-contrast · sagittal · 5.0mm · 0.47mm/px · 3 of 24 slices shown (2 of 2)]
[im 1/24]
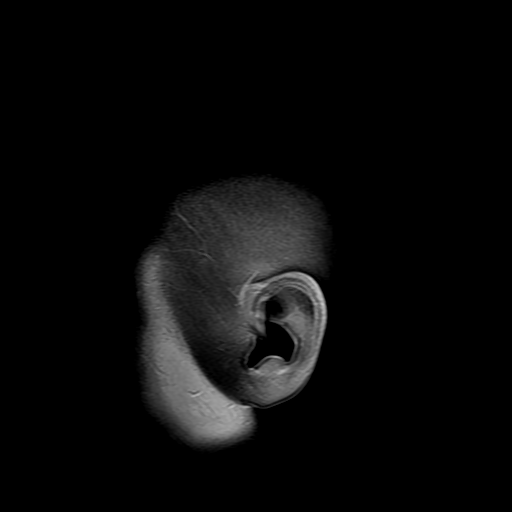
[im 12/24]
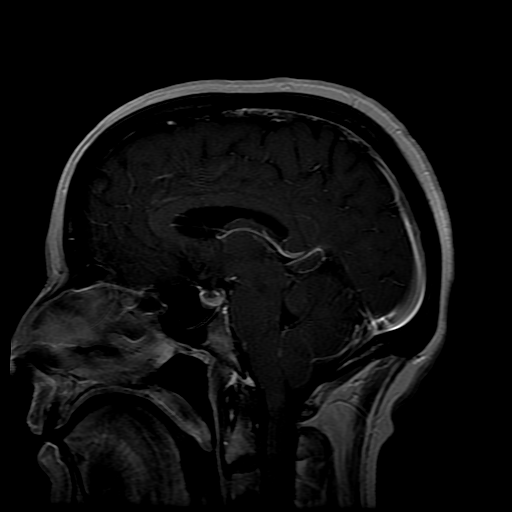
[im 24/24]
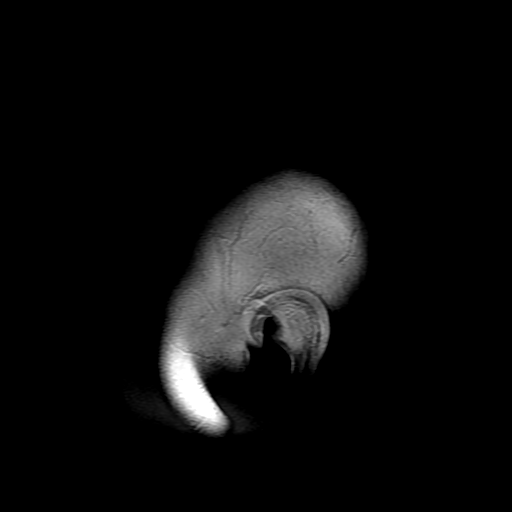

[Series 400: DWI · axial · 5.0mm · 1.09mm/px · z∈[-85,+59]mm · 4 of 30 slices shown (2 of 2)]
[im 1/30]
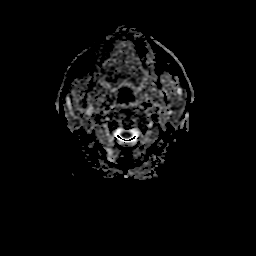
[im 10/30]
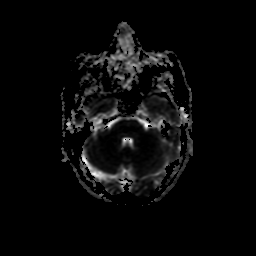
[im 20/30]
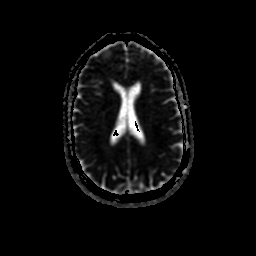
[im 30/30]
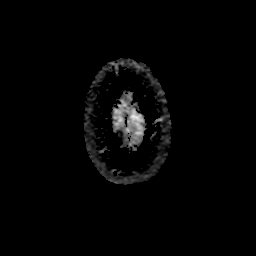

[27 of 48 positions shown; findings below may reference images not displayed]

FINDINGS: Previously identified areas of  enhancement in the left
cerebellum have resolved.  This may be due to treated metastatic
disease.

No enhancing metastatic deposits are seen on today's study.

Developmental venous anomaly left parietal lobe over the convexity
is unchanged.

Ventricle size is normal.  No mass effect or edema is present in
the brain.  Cerebellar tonsils remain slightly low lying without
definite Chiari malformation.  Paranasal sinuses show mild mucosal
thickening.
IMPRESSION: Enhancing lesions  left cerebellum have resolved and may be treated
metastatic disease.  Currently no metastatic deposits are
identified in the brain.

Developmental venous anomaly left frontal lobe, unchanged.

## 2014-03-17 ENCOUNTER — Ambulatory Visit
Admission: RE | Admit: 2014-03-17 | Discharge: 2014-03-17 | Disposition: A | Discharge status: Home / Self Care | Payer: Medicare Other | Source: Ambulatory Visit | Attending: Oncology | Admitting: Oncology

## 2014-03-17 DIAGNOSIS — Z9011 Acquired absence of right breast and nipple: Secondary | ICD-10-CM

## 2014-03-17 DIAGNOSIS — Z1231 Encounter for screening mammogram for malignant neoplasm of breast: Secondary | ICD-10-CM | POA: Diagnosis not present

## 2014-03-22 ENCOUNTER — Encounter (HOSPITAL_COMMUNITY): Payer: Self-pay

## 2014-03-22 ENCOUNTER — Ambulatory Visit (HOSPITAL_COMMUNITY)
Admission: RE | Admit: 2014-03-22 | Discharge: 2014-03-22 | Disposition: A | Discharge status: Home / Self Care | Payer: Medicare Other | Source: Ambulatory Visit | Attending: Oncology | Admitting: Oncology

## 2014-03-22 DIAGNOSIS — C50919 Malignant neoplasm of unspecified site of unspecified female breast: Secondary | ICD-10-CM

## 2014-03-22 MED ORDER — IOHEXOL 300 MG/ML  SOLN
50.0000 mL | Freq: Once | INTRAMUSCULAR | Status: AC | PRN
  Administered 2014-03-22: 50 mL via ORAL

## 2014-03-22 MED ORDER — IOHEXOL 300 MG/ML  SOLN
100.0000 mL | Freq: Once | INTRAMUSCULAR | Status: AC | PRN
  Administered 2014-03-22: 100 mL via INTRAVENOUS

## 2014-03-23 IMAGING — CT CT ABD-PELV W/ CM
2 of 5 series · 15 of 46 positions shown, 17 images · IV contrast (OMNIPAQUE)
Comparison: 07/14/2012

CLINICAL DATA: Stage IV breast cancer on chemotherapy, status post
right mastectomy, evaluate for response

CT CHEST, ABDOMEN AND PELVIS WITH CONTRAST
TECHNIQUE: Multidetector CT imaging of the chest, abdomen and
pelvis was performed following the standard protocol during bolus
administration of intravenous contrast.
Contrast: 100mL OMNIPAQUE IOHEXOL 300 MG/ML  SOLN

[Series 2: cap with st · axial · 0.77mm/px · z∈[-580,-60]mm · 12 of 120 slices shown, 14 images]
[im 8/120  soft-tissue]
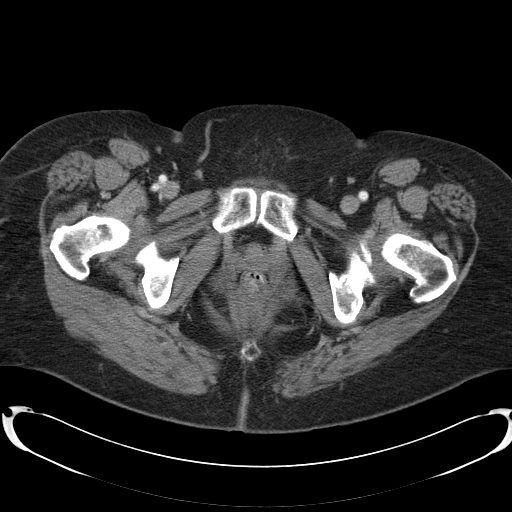
[im 8/120  bone]
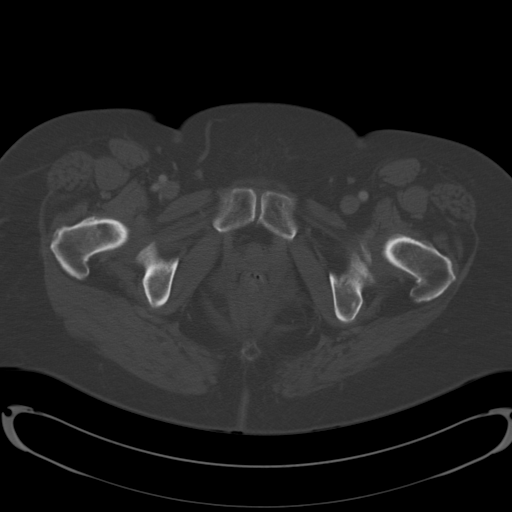
[im 15/120  soft-tissue]
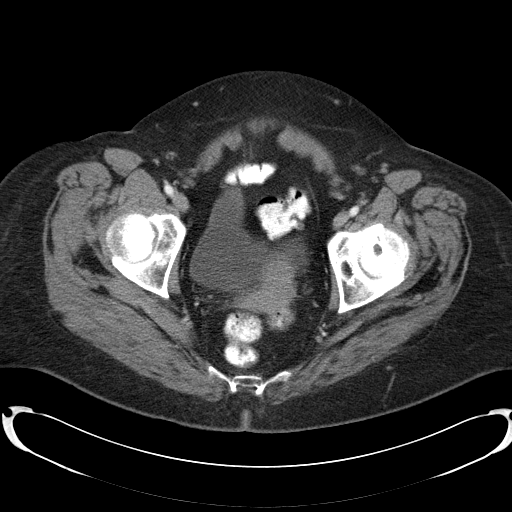
[im 30/120  soft-tissue]
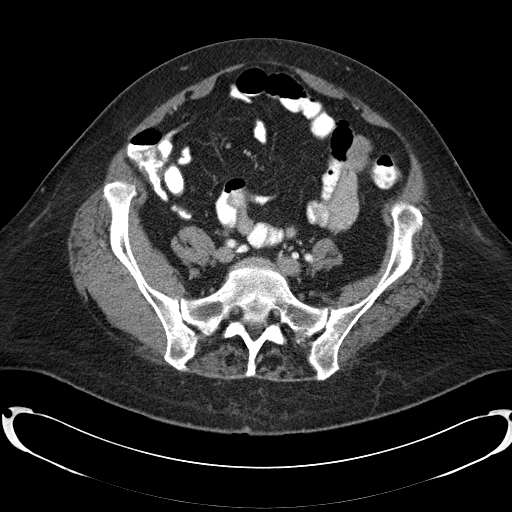
[im 38/120  soft-tissue]
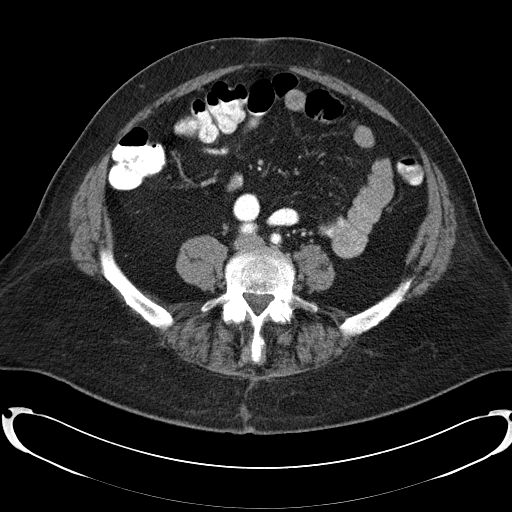
[im 45/120  soft-tissue]
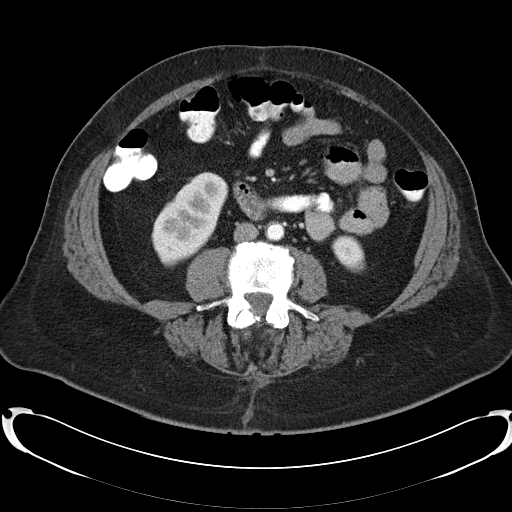
[im 53/120  soft-tissue]
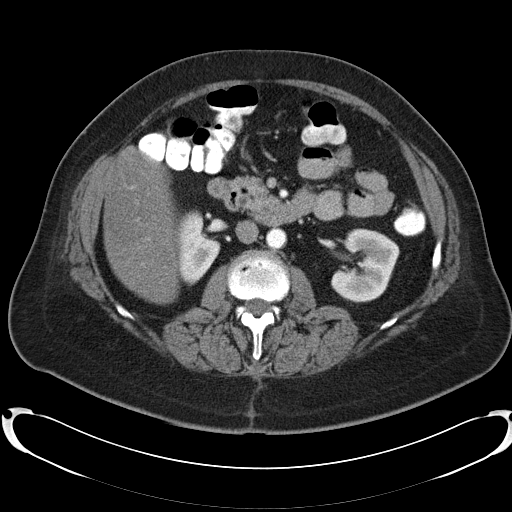
[im 67/120  soft-tissue]
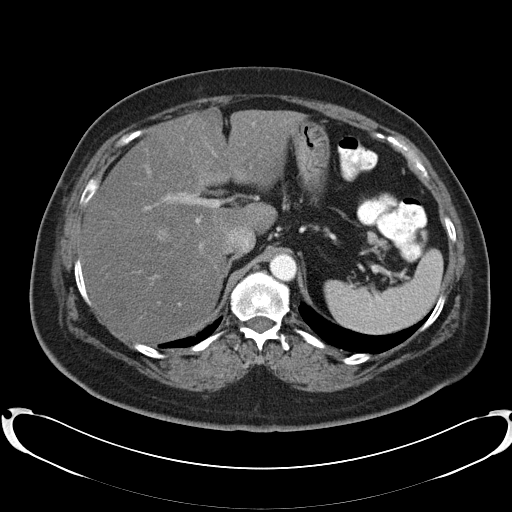
[im 75/120  soft-tissue]
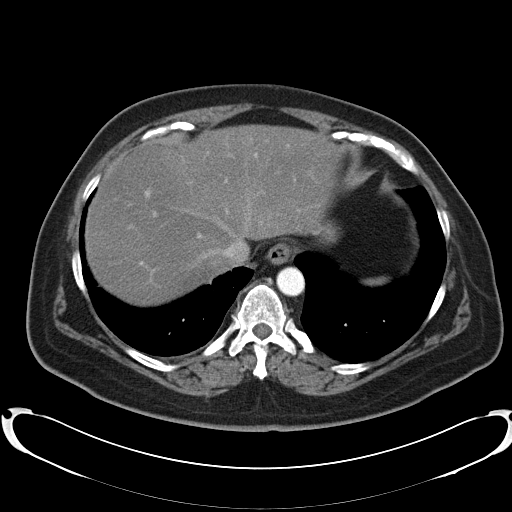
[im 82/120  soft-tissue]
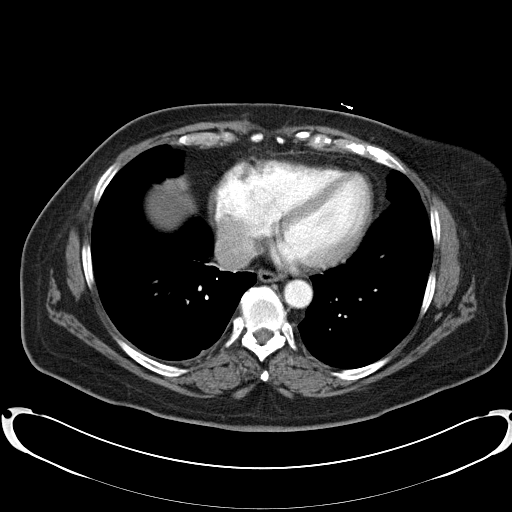
[im 82/120  bone]
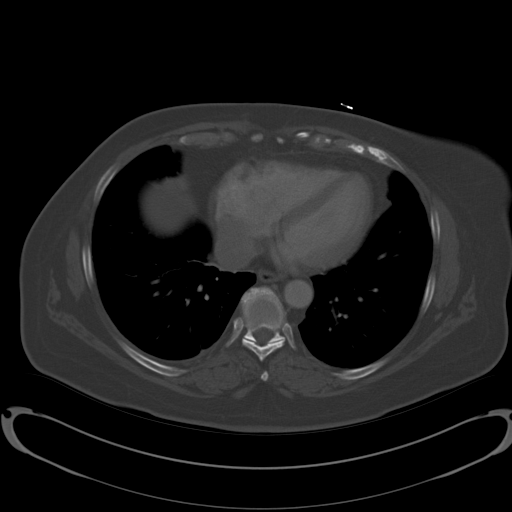
[im 90/120  soft-tissue]
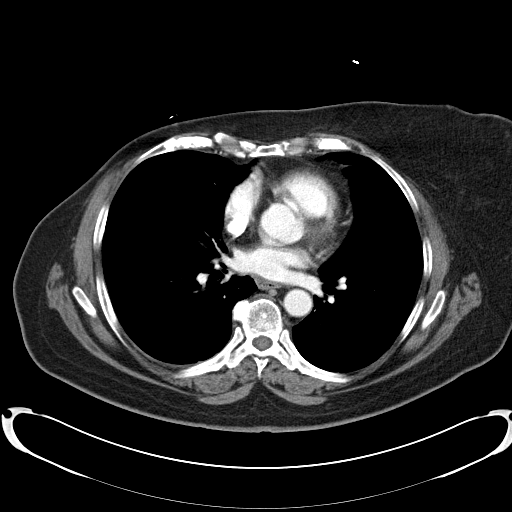
[im 105/120  soft-tissue]
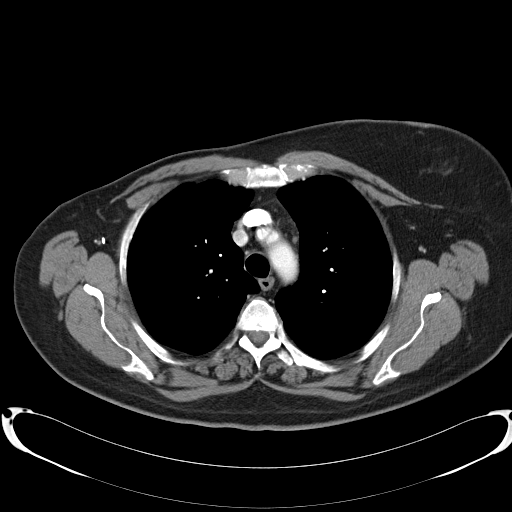
[im 112/120  soft-tissue]
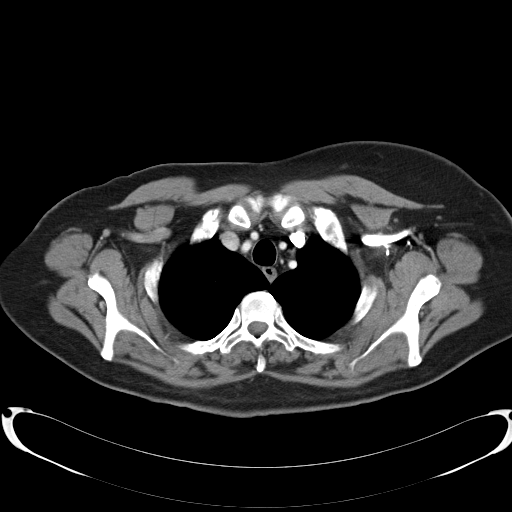

[Series 602: <mpr thick range> · coronal · 1.17mm/px · 3 of 98 slices shown]
[im 33/98  soft-tissue]
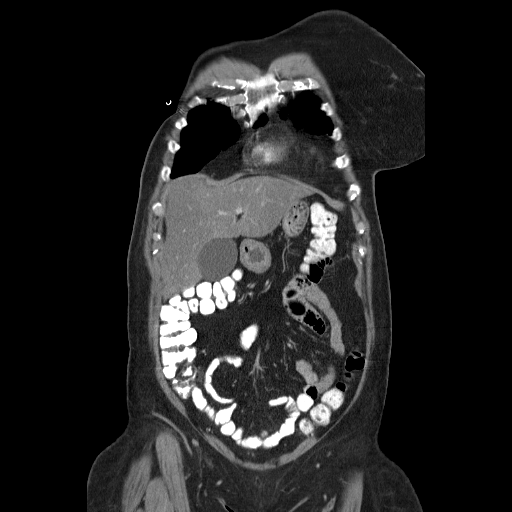
[im 44/98  soft-tissue]
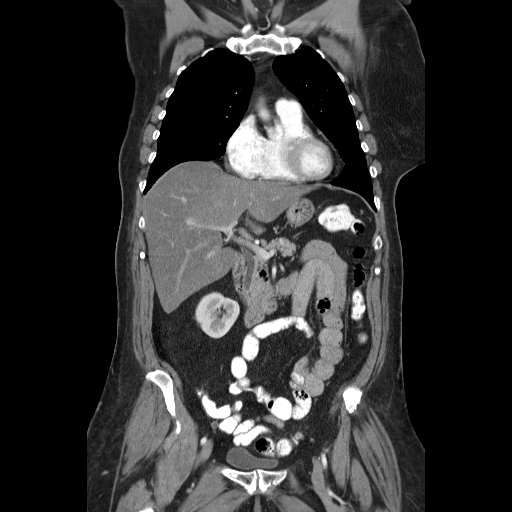
[im 54/98  soft-tissue]
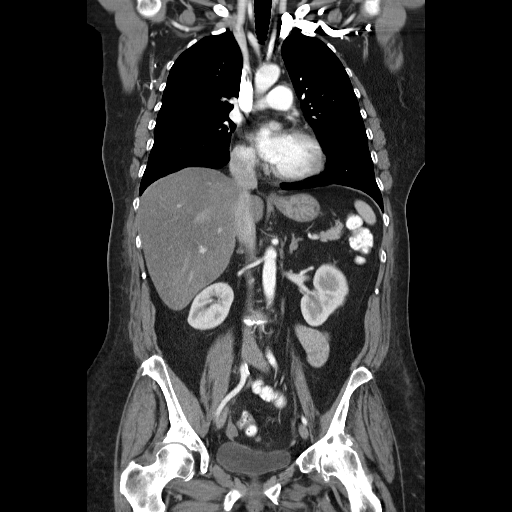

[15 of 46 positions shown; findings below may reference images not displayed]

---

RECIST

Target Lesions:

1. Right lower lobe nodule measures 6 mm (series 4/image 37),
unchanged

2. Subcarinal node tissue 7 mm (series 2/image 28), previously 9 mm

3. Right hilar nodal tissue measures 6 mm (series 2/image 25),
previously 8 mm

Non-target Lesions:

1. Right pleural effusion, minimal/trace, unchanged

2. Superior segment right lower lobe nodularity is unchanged
(series 4/image 27)

---

CT CHEST
FINDINGS: Status post right mastectomy.

Minimal residual nodularity in the right lower lobe (series
4/images 27 and 37), unchanged.  No new/suspicious pulmonary
nodules.  Minimal/trace residual right pleural fluid/thickening
(series 2/image 36).  No pneumothorax.

Visualized thyroid is unremarkable.

The heart is normal in size.  No pericardial effusion.

Partially calcified subcarinal node (series 2/image 26).  Minimal
residual soft tissue in the right hilar and subcarinal regions
(series 2/images 25 and 28), grossly unchanged. No suspicious
mediastinal, hilar, or axillary lymphadenopathy.

Status post right mastectomy with right axillary lymph node
dissection.

Degenerative changes of the thoracic spine.  Stable sclerotic
lesion in the left posteromedial 11th rib (series 2/image 46).
IMPRESSION: Status post right mastectomy and right axillary lymph node
dissection.

No evidence of new or progressive disease in the chest.

Stable sclerotic lesion in the left posteromedial 11th rib.

RECIST 1.0 measurements as above.

CT ABDOMEN AND PELVIS
FINDINGS: Geographic hepatic steatosis. Suspected perfusion
anomaly along the right hepatic dome (series 2/image 50).  No
suspicious hepatic lesions.

Spleen, pancreas, and adrenal glands are within normal limits.

Suspected 9 x 11 mm polyp in the gallbladder fundus (series 2/image
66), less likely focal adenomyomatosis, previously 6 x 9 mm in
8001.

Kidneys are within normal limits

No evidence of bowel obstruction.  Normal appendix.

Atherosclerotic calcifications of the abdominal aorta and branch
vessels.

No abdominopelvic ascites.

No suspicious abdominopelvic lymphadenopathy.

Uterus and bilateral ovaries are unremarkable.

Bladder is within normal limits.

Mild degenerative changes of the lumbar spine.  Stable sclerotic
lesion in the right iliac wing (series 2/image 86).  Stable benign
sclerosis in the left iliac wing (series 2/image 94).
IMPRESSION: No evidence of new or progress disease in the abdomen or pelvis.

Stable sclerotic lesion in the right iliac wing.

RECIST 1.0 measurements as above.

Suspected 9 x 11 mm polyp in the gallbladder fundus, less likely
focal adenomyomatosis, previously 6 x 9 mm in 8001.  Given very
slow growth, attention on follow-up is likely sufficient, although
at this size elective surgical consultation could be considered.

## 2014-03-24 ENCOUNTER — Other Ambulatory Visit (HOSPITAL_BASED_OUTPATIENT_CLINIC_OR_DEPARTMENT_OTHER): Payer: Medicare Other

## 2014-03-24 ENCOUNTER — Telehealth: Payer: Self-pay | Admitting: Oncology

## 2014-03-24 ENCOUNTER — Other Ambulatory Visit: Payer: Self-pay | Admitting: *Deleted

## 2014-03-24 ENCOUNTER — Ambulatory Visit (HOSPITAL_BASED_OUTPATIENT_CLINIC_OR_DEPARTMENT_OTHER): Payer: Medicare Other | Admitting: Oncology

## 2014-03-24 ENCOUNTER — Encounter: Payer: Self-pay | Admitting: *Deleted

## 2014-03-24 VITALS — BP 119/70 | HR 92 | Temp 98.2°F | Resp 18 | Ht 65.0 in | Wt 170.9 lb

## 2014-03-24 DIAGNOSIS — C7951 Secondary malignant neoplasm of bone: Secondary | ICD-10-CM | POA: Diagnosis not present

## 2014-03-24 DIAGNOSIS — C7952 Secondary malignant neoplasm of bone marrow: Secondary | ICD-10-CM

## 2014-03-24 DIAGNOSIS — C7800 Secondary malignant neoplasm of unspecified lung: Secondary | ICD-10-CM

## 2014-03-24 DIAGNOSIS — M949 Disorder of cartilage, unspecified: Secondary | ICD-10-CM

## 2014-03-24 DIAGNOSIS — M8580 Other specified disorders of bone density and structure, unspecified site: Secondary | ICD-10-CM

## 2014-03-24 DIAGNOSIS — R5381 Other malaise: Secondary | ICD-10-CM

## 2014-03-24 DIAGNOSIS — M899 Disorder of bone, unspecified: Secondary | ICD-10-CM

## 2014-03-24 DIAGNOSIS — C50919 Malignant neoplasm of unspecified site of unspecified female breast: Secondary | ICD-10-CM

## 2014-03-24 DIAGNOSIS — K7689 Other specified diseases of liver: Secondary | ICD-10-CM

## 2014-03-24 DIAGNOSIS — C50419 Malignant neoplasm of upper-outer quadrant of unspecified female breast: Secondary | ICD-10-CM

## 2014-03-24 DIAGNOSIS — J984 Other disorders of lung: Secondary | ICD-10-CM

## 2014-03-24 DIAGNOSIS — C801 Malignant (primary) neoplasm, unspecified: Secondary | ICD-10-CM | POA: Diagnosis not present

## 2014-03-24 DIAGNOSIS — E7800 Pure hypercholesterolemia, unspecified: Secondary | ICD-10-CM | POA: Diagnosis not present

## 2014-03-24 DIAGNOSIS — J449 Chronic obstructive pulmonary disease, unspecified: Secondary | ICD-10-CM

## 2014-03-24 DIAGNOSIS — M25559 Pain in unspecified hip: Secondary | ICD-10-CM

## 2014-03-24 DIAGNOSIS — R5383 Other fatigue: Secondary | ICD-10-CM

## 2014-03-24 LAB — COMPREHENSIVE METABOLIC PANEL (CC13)
ALT: 31 U/L (ref 0–55)
ANION GAP: 11 meq/L (ref 3–11)
AST: 34 U/L (ref 5–34)
Albumin: 3.5 g/dL (ref 3.5–5.0)
Alkaline Phosphatase: 223 U/L — ABNORMAL HIGH (ref 40–150)
BILIRUBIN TOTAL: 0.35 mg/dL (ref 0.20–1.20)
BUN: 15.1 mg/dL (ref 7.0–26.0)
CO2: 19 meq/L — AB (ref 22–29)
CREATININE: 0.9 mg/dL (ref 0.6–1.1)
Calcium: 10.1 mg/dL (ref 8.4–10.4)
Chloride: 111 mEq/L — ABNORMAL HIGH (ref 98–109)
Glucose: 134 mg/dl (ref 70–140)
Potassium: 4 mEq/L (ref 3.5–5.1)
Sodium: 141 mEq/L (ref 136–145)
Total Protein: 8 g/dL (ref 6.4–8.3)

## 2014-03-24 LAB — CBC WITH DIFFERENTIAL/PLATELET
BASO%: 0.8 % (ref 0.0–2.0)
BASOS ABS: 0.1 10*3/uL (ref 0.0–0.1)
EOS ABS: 0.2 10*3/uL (ref 0.0–0.5)
EOS%: 1.9 % (ref 0.0–7.0)
HEMATOCRIT: 42.7 % (ref 34.8–46.6)
HEMOGLOBIN: 14 g/dL (ref 11.6–15.9)
LYMPH%: 10.3 % — AB (ref 14.0–49.7)
MCH: 25.8 pg (ref 25.1–34.0)
MCHC: 32.7 g/dL (ref 31.5–36.0)
MCV: 78.7 fL — ABNORMAL LOW (ref 79.5–101.0)
MONO#: 1 10*3/uL — AB (ref 0.1–0.9)
MONO%: 10.3 % (ref 0.0–14.0)
NEUT%: 76.7 % (ref 38.4–76.8)
NEUTROS ABS: 7.2 10*3/uL — AB (ref 1.5–6.5)
PLATELETS: 285 10*3/uL (ref 145–400)
RBC: 5.42 10*6/uL (ref 3.70–5.45)
RDW: 14.2 % (ref 11.2–14.5)
WBC: 9.3 10*3/uL (ref 3.9–10.3)
lymph#: 1 10*3/uL (ref 0.9–3.3)

## 2014-03-24 LAB — PHOSPHORUS: PHOSPHORUS: 3.2 mg/dL (ref 2.3–4.6)

## 2014-03-24 LAB — CK: Total CK: 77 U/L (ref 7–177)

## 2014-03-24 LAB — URIC ACID (CC13): URIC ACID, SERUM: 3.6 mg/dL (ref 2.6–7.4)

## 2014-03-24 LAB — LIPID PANEL
Cholesterol: 224 mg/dL — ABNORMAL HIGH (ref 0–200)
HDL: 48 mg/dL (ref >39)
LDL CALC: 138 mg/dL — AB (ref 0–99)
TRIGLYCERIDES: 191 mg/dL — AB (ref <150)
Total CHOL/HDL Ratio: 4.7 Ratio
VLDL: 38 mg/dL (ref 0–40)

## 2014-03-24 LAB — BILIRUBIN, DIRECT

## 2014-03-24 MED ORDER — OMEPRAZOLE 40 MG PO CPDR: 40.0000 mg | DELAYED_RELEASE_CAPSULE | Freq: Every day | ORAL | Status: DC

## 2014-03-24 MED ORDER — INV-EVEROLIMUS (RAD0001) 5MG TABLET NOVARTIS CRAD001Y24135: 2.0000 | ORAL_TABLET | Freq: Every day | ORAL | Status: DC

## 2014-03-24 NOTE — Progress Notes (Signed)
[place]  [phone] [phone]     ID: Barbara Parks DOB: 06/11/1951  MR#: [id number]  JJK#:[id number]  Patient Care Team: Marjorie Smolder, MD as PCP - General (Family Medicine) Candee Furbish, MD (Cardiology) Eston Esters, MD (Hematology and Oncology) Gaye Pollack, MD (Cardiothoracic Surgery)  OTHER MD: Dr Erroll Luna- Merlene Laughter, DDS  CHIEF COMPLAINT: metastatic breast cancer, on BOLERO study  CURRENT TREATMENT: letrozole + everolimus  BREAST CANCER HISTORY: From Dr. Collier Salina Parks's original intake note 04/13/2004:  "This woman has been in good health all of her life. She recently moved from [place] to work here.  She palpated a mass at about the 12 o'clock position in mid-July. She has not noticed any nipple retraction or skin changes.  She was seen by her primary care doctor who subsequently referred her for a mammogram.  Mammogram was performed on 03/01/04. This demonstrated a spiculated 2 cm mass at the 12 o'clock position in the right breast.  Upper outer quadrant of the left breast shows some distortion.   Physical exam at that time showed a firm, nontender nodule at the 12 o'clock position in the right breast, 5 cm from the nipple.  Ultrasound of this area showed a hypoechoic ill-defined mass, measuring 2.2 x 1.3 x 1.4 cm.  Physical exam of the left breast showed general vague thickening, upper outer quadrant of the left breast with a discrete palpable mass. The ultrasound performed showed a single hypoechoic ill-defined nodule at the 1 o'clock position, measuring 7 x 5 x 6 mm.  She had biopsies of both lesions on 03/02/03.  Needle core biopsy of the lesion on the right breast revealed invasive mammary carcinoma.  Needle core biopsy of the left breast showed a complex fibroadenoma.  Prognostic panel of the lesion on the right breast showed it to be ER positive at 73%, PR positive at 90% and proliferative index 9%, HER-2 was 1+.  Patient was  referred to Dr. Annamaria Boots, who performed a simple mastectomy with sentinel lymph node evaluation on 03/22/04.  Final pathology showed this to be an invasive ductal carcinoma  with lobular features, measuring 2.2 cm, grade 2 of 3.  Margins negative for carcinoma.  Invasive ductal carcinoma was extended to involve deep dermis of the nipple.  Lymphovascular invasion was identified.  Total of 4 sentinel lymph nodes were evaluated.  Touch imprints at the time of the OR was felt to be negative.  Subsequent evaluation showed a 5 mm focus of metastatic carcinoma in one of the four lymph nodes on microscopic after sectioning.  There was extracapsular extension  of one lymph node as well. The remaining three lymph nodes were all negative."  The patient's subsequent history is detailed below.  INTERVAL HISTORY: Barbara Parks returns today for followup of her metastatic breast cancer. Our research nurse Francene Castle was also present.  She continues on the [place] trial, currently cycle 33 (!). Barbara Parks receives everolimus and letrozole. She is tolerating these well, and specifically has had no mouth lesions or cough. She denies significant hot flashes, vaginal dryness, or arthralgias myalgias secondary to the letrozole. The only side effect she attributes to her everolimus is fatigued, which can be significant, but is very variable. She knows "when I wake up" whether she is going to have a difficult day or not. Despite that she is doing all her activities of daily living including currently handling the complicated problems following her mother's death recently.  REVIEW OF SYSTEMS: Barbara Parks has  developed increasing pain in her left hip area radiating down her left leg. This is not a new problem, but it is a bit worse. She tells me she had surgery to her lower back in [place] many years ago. She has not seen a neurosurgeon orthopedist here for that problem. She denies headaches, visual changes, nausea, vomiting, dizziness, or gait  imbalance except for the fact that because of her left hip issues she is very careful about falls. Because she is taking ibuprofen she has a little bit of urinary frequency, which he says "is what always happens" when she takes that medication. She denies gastritis symptoms. There has been no black or tarry stools and no blood in her stool. A detailed review of systems today was otherwise noncontributory  PAST MEDICAL HISTORY: Past Medical History  Diagnosis Date  . Asthma   . Shortness of breath   . Arthritis     left hip  . Contact dermatitis     from a bandaid that had Latex   . breast ca 2005    breast/chemo R mastectomy  . Metastasis to lung dx'd 08/2011  . Osteopenia due to cancer therapy 09/09/2013  . Hypercholesteremia   . ADHD (attention deficit hyperactivity disorder)     PAST SURGICAL HISTORY: Past Surgical History  Procedure Laterality Date  . Spine surgery  1996  . Breast surgery  2005    right  . Video bronchoscopy  09/11/2011    Procedure: VIDEO BRONCHOSCOPY;  Surgeon: Gaye Pollack, MD;  Location: [facility];  Service: Thoracic;  Laterality: N/A;  . Chest tube insertion  09/11/2011    Procedure: INSERTION PLEURAL DRAINAGE CATHETER;  Surgeon: Gaye Pollack, MD;  Location: [facility];  Service: Thoracic;  Laterality: Right;  . Pericardial window  09/11/2011    Procedure: PERICARDIAL WINDOW;  Surgeon: Gaye Pollack, MD;  Location: [facility];  Service: Thoracic;  Laterality: N/A;  . Removal of pleural drainage catheter  12/19/2011    Procedure: REMOVAL OF PLEURAL DRAINAGE CATHETER;  Surgeon: Gaye Pollack, MD;  Location: [facility];  Service: Thoracic;  Laterality: Right;  TO BE DONE IN MINOR ROOM, SHORT STAY  . Cataract extraction w/phaco Left 01/18/2014    Procedure: CATARACT EXTRACTION PHACO AND INTRAOCULAR LENS PLACEMENT (IOC);  Surgeon: Elta Guadeloupe T. Gershon Crane, MD;  Location: [facility];  Service: Ophthalmology;  Laterality: Left;  CDE 15.79  . Cataract extraction w/phaco Right 02/08/2014     Procedure: CATARACT EXTRACTION PHACO AND INTRAOCULAR LENS PLACEMENT (IOC);  Surgeon: Elta Guadeloupe T. Gershon Crane, MD;  Location: [facility];  Service: Ophthalmology;  Laterality: Right;  CDE 4.41    FAMILY HISTORY Family History  Problem Relation Age of Onset  . Cancer Mother     breast  . Heart disease Father   . Anesthesia problems Neg Hx    the patient's mother was diagnosed with breast cancer at age 68, she died age 13 from congestive heart failure..The patient's father died from heart disease at age 15.  She has one sister alive & well.  Two brothers alive & well, one with diabetes. The patient's sister was also diagnosed with breast cancer, and was tested for the BRCA gene, and was negative. The patient herself has not been tested. There is no history of ovarian cancer in the family  GYNECOLOGIC HISTORY:  No LMP recorded. Patient is postmenopausal. Menarche age 75, the patient is GX P0. She stopped having periods with chemotherapy in 2005. 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 She was a Archivist. She is single, lives alone with her Shitzu-poodle Sammie.. Family is all in the [place] area.     ADVANCED DIRECTIVES: Not in place. At the 02/23/2014 visit the patient was given the appropriate documents to complete and notarize at her discretion. She tells me she is planning to name her sister, Billie Ruddy, as healthcare power of attorney. [name] can be reached at [phone]   HEALTH MAINTENANCE: History  Substance Use Topics  . Smoking status: Former Smoker -- 1.50 packs/day for 30 years    Types: Cigarettes    Quit date: 09/10/2007  . Smokeless tobacco: Not on file  . Alcohol Use: No     Colonoscopy:  PAP:  Bone density: 08/11/2013, showed osteopenia  Lipid panel:  Allergies  Allergen Reactions  . Aspirin     REACTION: upset stomach  . Latex Other (See Comments)     Blistering and skin peels off  . Nsaids Nausea And Vomiting    Extreme nausea and vomiting    Current Outpatient Prescriptions  Medication Sig Dispense Refill  . Bromfenac Sodium (PROLENSA) 0.07 % SOLN Apply 1 drop to eye every morning. Pt instills 1 drop to left eye daily.      . cholecalciferol (VITAMIN D) 1000 UNITS tablet Take 2,000 Units by mouth daily.       Marland Kitchen gemfibrozil (LOPID) 600 MG tablet Take 1 tablet (600 mg total) by mouth 2 (two) times daily before a meal.  90 tablet  0  . ibuprofen (ADVIL,MOTRIN) 200 MG tablet Take 400 mg by mouth every 6 (six) hours as needed. For pain      . Investigational everolimus (RAD001) 5 MG tablet Novartis [id number] Take 2 tablets by mouth daily. Take with a glass of water.  70 tablet  0  . letrozole (FEMARA) 2.5 MG tablet Take 1 tablet (2.5 mg total) by mouth daily.  90 tablet  4  . loperamide (IMODIUM) 2 MG capsule Take 2 mg by mouth 4 (four) times daily as needed for diarrhea or loose stools.      . metoprolol tartrate (LOPRESSOR) 25 MG tablet Take 0.5 tablets (12.5 mg total) by mouth daily.  30 tablet  4   No current facility-administered medications for this visit.    OBJECTIVE: Middle-aged white woman who appears stated age 30 Vitals:   03/24/14 0934  BP: 146/67  Pulse: 92  Temp: 98.2 F (36.8 C)  Resp: 18     Body mass index is 28.44 kg/(m^2).    ECOG FS:1 - Symptomatic but completely ambulatory  Ocular: Sclerae unicteric, pupils round and equal Ear-nose-throat: Oropharynx clear and moist, no lesions noted Lymphatic: No cervical or supraclavicular adenopathy Lungs no rales or rhonchi, good excursion bilaterally Heart regular rate and rhythm, no murmur appreciated Abd soft, nontender, positive bowel sounds, no masses palpated MSK no focal spinal tenderness, no upper extremity lymphedema Neuro: non-focal, well-oriented, appropriate affect Breasts: The right breast is status post mastectomy. There is no evidence of chest  wall recurrence. The right axilla is benign. Left breast is unremarkable   LAB RESULTS:  CMP     Component Value Date/Time   NA 142 02/23/2014 0901   NA 136 07/16/2012 0910   K 3.9 02/23/2014 0901   K 4.0 07/16/2012 0910   CL 109* 12/31/2012 0940   CL 103 07/16/2012 0910  CO2 21* 02/23/2014 0901   CO2 21 07/16/2012 0910   GLUCOSE 150* 02/23/2014 0901   GLUCOSE 127* 12/31/2012 0940   GLUCOSE 140* 07/16/2012 0910   BUN 19.3 02/23/2014 0901   BUN 16 07/16/2012 0910   CREATININE 0.9 02/23/2014 0901   CREATININE 0.75 07/16/2012 0910   CALCIUM 10.1 02/23/2014 0901   CALCIUM 10.1 07/16/2012 0910   PROT 7.9 02/23/2014 0901   PROT 8.0 07/16/2012 0910   ALBUMIN 3.5 02/23/2014 0901   ALBUMIN 3.7 07/16/2012 0910   AST 32 02/23/2014 0901   AST 30 07/16/2012 0910   ALT 33 02/23/2014 0901   ALT 33 07/16/2012 0910   ALKPHOS 237* 02/23/2014 0901   ALKPHOS 142* 07/16/2012 0910   BILITOT 0.33 02/23/2014 0901   BILITOT 0.2* 07/16/2012 0910   GFRNONAA >90 09/13/2011 0440   GFRAA >90 09/13/2011 0440    I No results found for this basename: SPEP,  UPEP,   kappa and lambda light chains    Lab Results  Component Value Date   WBC 9.3 03/24/2014   NEUTROABS 7.2* 03/24/2014   HGB 14.0 03/24/2014   HCT 42.7 03/24/2014   MCV 78.7* 03/24/2014   PLT 285 03/24/2014      Chemistry      Component Value Date/Time   NA 142 02/23/2014 0901   NA 136 07/16/2012 0910   K 3.9 02/23/2014 0901   K 4.0 07/16/2012 0910   CL 109* 12/31/2012 0940   CL 103 07/16/2012 0910   CO2 21* 02/23/2014 0901   CO2 21 07/16/2012 0910   BUN 19.3 02/23/2014 0901   BUN 16 07/16/2012 0910   CREATININE 0.9 02/23/2014 0901   CREATININE 0.75 07/16/2012 0910      Component Value Date/Time   CALCIUM 10.1 02/23/2014 0901   CALCIUM 10.1 07/16/2012 0910   ALKPHOS 237* 02/23/2014 0901   ALKPHOS 142* 07/16/2012 0910   AST 32 02/23/2014 0901   AST 30 07/16/2012 0910   ALT 33 02/23/2014 0901   ALT 33 07/16/2012 0910   BILITOT 0.33 02/23/2014 0901     BILITOT 0.2* 07/16/2012 0910       Lab Results  Component Value Date   LABCA2 58* 09/14/2012    No components found with this basename: [id number]    No results found for this basename: INR,  in the last 168 hours  Urinalysis    Component Value Date/Time   COLORURINE YELLOW 09/11/2011 [place] 09/11/2011 0819   LABSPEC 1.030 11/14/2011 1101   LABSPEC 1.013 09/11/2011 0819   PHURINE 6.5 09/11/2011 0819   GLUCOSEU NEGATIVE 09/11/2011 0819   HGBUR SMALL* 09/11/2011 [place] 09/11/2011 0819   KETONESUR 15* 09/11/2011 0819   PROTEINUR NEGATIVE 09/11/2011 0819   UROBILINOGEN 0.2 09/11/2011 0819   NITRITE NEGATIVE 09/11/2011 0819   LEUKOCYTESUR NEGATIVE 09/11/2011 0819    STUDIES: Ct Chest W Contrast  03/22/2014   ADDENDUM REPORT: 03/22/2014 14:29  ADDENDUM: Target lesions:  1. Right lower lobe pulmonary nodule measures 6 mm on image number 41. It measures 6 mm on the prior study.  2. Partially calcified subcarinal lymph node measures 6 mm on image number 28. This is unchanged.  3. Right hilar lymph node on image number 27 measures 5 mm. It previously measured 4 mm.   Electronically Signed   By: Kalman Jewels M.D.   On: 03/22/2014 14:29   03/22/2014   CLINICAL DATA:  Breast cancer.  EXAM: CT CHEST, ABDOMEN,  AND PELVIS WITH CONTRAST  TECHNIQUE: Multidetector CT imaging of the chest, abdomen and pelvis was performed following the standard protocol during bolus administration of intravenous contrast.  CONTRAST:  144m OMNIPAQUE IOHEXOL 300 MG/ML  SOLN  COMPARISON:  01/25/2014  FINDINGS: Recist 1.0  Target lesions:  1. Right lower lobe pulmonary nodule measures 6.5 mm on image number 41. It measured 6.3 mm on the prior study. 2. Partially calcified subcarinal lymph node measures 6.5 mm on image number 28. This is unchanged. 3. Right hilar lymph node on image number 27 measures 5 mm. It previously measured 4.5 mm. Non target lesions:  Non measurable.  CT CHEST  FINDINGS  Stable surgical changes from a right mastectomy. No chest wall masses, supraclavicular or axillary lymphadenopathy. The left breasts is grossly normal. A small nodule in the mid breast is unchanged. The bony thorax is intact. No destructive bone lesions or spinal canal compromise. Stable degenerative changes involving the spine.  The heart is normal and stable in size. No pericardial effusion. No mediastinal or hilar mass or adenopathy. Small scattered lymph nodes are unchanged. The esophagus is grossly normal. The aorta is normal in caliber. No dissection.  Examination of the lung parenchyma demonstrates a stable 6.5 mm nodule in the right lower lobe. No new or worrisome pulmonary lesions. No acute pulmonary findings. No pleural effusion.  CT ABDOMEN AND PELVIS FINDINGS  Diffuse fatty infiltration of the liver but no focal hepatic lesions or intrahepatic biliary dilatation. Stable areas of focal fatty sparing. The gallbladder is stable. There is a small nodule along the anterior wall. This is most likely a polyp or focal adenomyoma. No common bile duct dilatation. The pancreas is normal and stable. The spleen is normal in size. No focal lesions. There is a stable left adrenal gland nodule. The right adrenal gland is normal. Both kidneys are unremarkable.  The stomach, duodenum, small bowel and colon are unremarkable. No inflammatory changes, mass lesions or obstructive findings. No mesenteric or retroperitoneal mass or adenopathy. The aorta and branch vessels are patent. The major venous structures are patent.  The uterus and ovaries are normal. The bladder is normal. No pelvic mass, adenopathy or free pelvic fluid collections. No inguinal mass or adenopathy.  Examination the bony structures demonstrates advanced degenerative changes involving the left hip. No acute or worrisome bone findings. Stable faintly sclerotic lesion involving the left sacrum.  IMPRESSION: 1. Stable CT appearance of the chest,  abdomen and pelvis. No findings for recurrent or metastatic disease. 2. Stable sclerotic lesion in the left sacrum. 3. Stable heterogeneous hepatic steatosis.  Electronically Signed: By: MKalman JewelsM.D. On: 03/22/2014 12:10   Ct Abdomen Pelvis W Contrast  03/22/2014   ADDENDUM REPORT: 03/22/2014 14:29  ADDENDUM: Target lesions:  1. Right lower lobe pulmonary nodule measures 6 mm on image number 41. It measures 6 mm on the prior study.  2. Partially calcified subcarinal lymph node measures 6 mm on image number 28. This is unchanged.  3. Right hilar lymph node on image number 27 measures 5 mm. It previously measured 4 mm.   Electronically Signed   By: MKalman JewelsM.D.   On: 03/22/2014 14:29   03/22/2014   CLINICAL DATA:  Breast cancer.  EXAM: CT CHEST, ABDOMEN, AND PELVIS WITH CONTRAST  TECHNIQUE: Multidetector CT imaging of the chest, abdomen and pelvis was performed following the standard protocol during bolus administration of intravenous contrast.  CONTRAST:  1027mOMNIPAQUE IOHEXOL 300 MG/ML  SOLN  COMPARISON:  01/25/2014  FINDINGS: Recist 1.0  Target lesions:  1. Right lower lobe pulmonary nodule measures 6.5 mm on image number 41. It measured 6.3 mm on the prior study. 2. Partially calcified subcarinal lymph node measures 6.5 mm on image number 28. This is unchanged. 3. Right hilar lymph node on image number 27 measures 5 mm. It previously measured 4.5 mm. Non target lesions:  Non measurable.  CT CHEST FINDINGS  Stable surgical changes from a right mastectomy. No chest wall masses, supraclavicular or axillary lymphadenopathy. The left breasts is grossly normal. A small nodule in the mid breast is unchanged. The bony thorax is intact. No destructive bone lesions or spinal canal compromise. Stable degenerative changes involving the spine.  The heart is normal and stable in size. No pericardial effusion. No mediastinal or hilar mass or adenopathy. Small scattered lymph nodes are unchanged. The  esophagus is grossly normal. The aorta is normal in caliber. No dissection.  Examination of the lung parenchyma demonstrates a stable 6.5 mm nodule in the right lower lobe. No new or worrisome pulmonary lesions. No acute pulmonary findings. No pleural effusion.  CT ABDOMEN AND PELVIS FINDINGS  Diffuse fatty infiltration of the liver but no focal hepatic lesions or intrahepatic biliary dilatation. Stable areas of focal fatty sparing. The gallbladder is stable. There is a small nodule along the anterior wall. This is most likely a polyp or focal adenomyoma. No common bile duct dilatation. The pancreas is normal and stable. The spleen is normal in size. No focal lesions. There is a stable left adrenal gland nodule. The right adrenal gland is normal. Both kidneys are unremarkable.  The stomach, duodenum, small bowel and colon are unremarkable. No inflammatory changes, mass lesions or obstructive findings. No mesenteric or retroperitoneal mass or adenopathy. The aorta and branch vessels are patent. The major venous structures are patent.  The uterus and ovaries are normal. The bladder is normal. No pelvic mass, adenopathy or free pelvic fluid collections. No inguinal mass or adenopathy.  Examination the bony structures demonstrates advanced degenerative changes involving the left hip. No acute or worrisome bone findings. Stable faintly sclerotic lesion involving the left sacrum.  IMPRESSION: 1. Stable CT appearance of the chest, abdomen and pelvis. No findings for recurrent or metastatic disease. 2. Stable sclerotic lesion in the left sacrum. 3. Stable heterogeneous hepatic steatosis.  Electronically Signed: By: Kalman Jewels M.D. On: 03/22/2014 12:10   Mm Digital Screening Unilat L  03/17/2014   CLINICAL DATA:  Screening.  EXAM: DIGITAL SCREENING UNILATERAL LEFT MAMMOGRAM WITH CAD  COMPARISON:  Previous exam(s)  ACR Breast Density Category b: There are scattered areas of fibroglandular density.  FINDINGS: The  patient has had a right mastectomy. There are no findings suspicious for malignancy. Images were processed with CAD.  IMPRESSION: No mammographic evidence of malignancy. A result letter of this screening mammogram will be mailed directly to the patient.  RECOMMENDATION: Screening mammogram in one year. (Code:SM-B-01Y)  BI-RADS CATEGORY  1: Negative.   Electronically Signed   By: Hassan Rowan M.D.   On: 03/17/2014 16:40   ASSESSMENT: 63 y.o. [place], [place] woman with stage IV breast cancer, on BOLERO-4 trial  (1) status post right mastectomy and sentinel lymph node sampling 03/22/2004 for a pT2 pN1, stage IIB invasive ductal carcinoma with lobular features, grade 2, estrogen receptor and progesterone receptor positive, HER-2 negative, with an MIB-1 of 9% ([id number] and [id number])  (2) addition all right axillary lymph node sampling 05/07/2004 showed 2 benign lymph  nodes (4 lymph nodes previously removed, so total was one positive lymph node out of 6; [phone])  (3) the patient was evaluated by radiation oncology; no postmastectomy radiation recommended  (4) adjuvant chemotherapy with dose dense doxorubicin and cyclophosphamide x4 cycles (first cycle delayed one week) followed by dose dense paclitaxel x4 was completed 09/18/2004  (5) tamoxifen started March 2006, discontinued 2009  METASTATIC DISEASE: (6) presenting with a large pericardial effusion, large right pleural effusion and possible right middle lobe bronchial obstruction February 2013, status post pericardial window placement, fiberoptic bronchoscopy and right Pleurx placement 09/11/2011,, with biopsy of the bronchus intermedius and pericardial positive for metastatic breast cancer, estrogen receptor 91% positive with moderate staining intensity, progesterone receptor 100% positive with strong staining intensity, with an MIB-1 of 35%, and no HER-2 amplification, the signals ratio being 1.37 ([id number])  (7) enrolled in BOLERO-4 trial 10/10/2011,  receiving letrozole and everolimus  (a) two small areas of enhancement in the cerebellum noted by brain MRI 10/03/2011 were no longer apparent on repeat MRI 08/21/2012  (b) sclerotic lesions in left iliac bone and sacrum have not been biopsied; stable; plan is to start zolendronate after patient updates her dental care (extraction planned)  (c) RLL lung nodule, R hilar and subcarinal lymph nodes: stable  (9) additional problems:  (a) hepatic steatosis  (b) COPD/ emphysema  (c) advanced L hip osteoarthritis  (d) aortoiliac atherosclerosis  (e) dental evaluation pending w possible dental extractions  (f) possible thalassemia trait    PLAN: Rowene has a long history of left hip pain and left sciatica, status post remote surgery in [place]. However she is having more symptoms in that area. She does have sclerotic bone lesions, which have never been biopsied. I think it would be prudent to obtain an MRI of the lumbar spine and this is being arranged.  In the interim she is treating herself with ibuprofen. I have put in an order for Prilosec 40 mg for her to take daily so she does not developed gastritis or Frank ulcers from the nonsteroidals. Her creatinine is holding up nicely.  She has not had a brain MRI since January 2014. We are following up on that also before her next visit here. She will call us for results of both the brain and the lumbar spine MRIs, so she does not have to wait until the next visit to get results.  We again discussed zolendronate. She is so busy working on her mother's S. date that she doesn't have time to get the tooth pulled that she needs to pull. It isn't hurting her, of course. I urged her to put the dental care further up in her list so that we can get zolendronate started. I would like to give her at least 2 years worth of treatment, with dosing every 6 months.  Otherwise she will return in September for her next followup visit. I am delighted that she remains  stable and is tolerating her treatments so well. She will call with any problems that may develop before her next visit here. The patient has a good understanding of the overall plan. She agrees with it. She knows the goal of treatment in her case is control. She will call with any problems that may develop before her next visit here.  Chauncey Cruel, MD   03/24/2014 9:37 AM

## 2014-03-24 NOTE — Progress Notes (Signed)
03/24/14 at 11:35am - cycle 33, day 1 study notes- The pt was into the cancer center this am for her fasting labs and cycle 33 assessments.  The pt confirmed that she was fasting upon arrival to the cancer center for her labs.  The pt returned her cycle 32 everolimus study drug box.  She confirmed that she took her everolimus (10 mg ) everyday along with letrozole (2.5 mg) from 02/23/14 - 03/23/14 (29 days).  The pt's everolimus drug box had 12 remaining (unopened) blister packs.  The pt was dispensed 70 pills - 58 doses (29 days x 2 doses) = 12 returned.  Therefore, the pt is 100% compliant with her study drug, everolimus, for cycle 32.  The pt's everolimus was taken to the pharmacy for the drug accountability check and storage.  The pt was seen and examined today by Dr. Jana Hakim.  Dr. Jana Hakim reviewed her labs, and he felt that her abnormal lab values were "not clinically significant".  The pt continues to lose some weight and watch her sugar intake.  Her glucose was WNL today.  The vitals were stable, and her mean BP was obtained for study purposes.  The pt's main complaint is her worsening back pain.  She had discs problems at baseline, but she states it is bothering her more now.  Dr. Jana Hakim felt that it was prudent to obtain a MRI of the spine as well as a MRI of her brain.  The pt was in agreement with these scans.  The pt states her eyes are "great" following her bilateral cataract surgeries.  She reports the following AE's as ongoing:  hot flashes, memory impairment, intermittent nausea, arthritis in hands, fatigue, and bilateral knee pain.  The pt denies any new medications.  She reports the following concomitant medications as ongoing:  Lopressor, compazine prn, immodium prn, lopid, ibuprofen prn, and vitamin D3.  She is performing all of her usual activities with some limitations.  ECOG=1.  The pt was dispensed her everolimus drug box for self administration for cycle 33.  The pt's lipid panel is  pending.  The pt is aware of her September appointments.    03/24/14 at 4pm - The pt was called and notified that her lipid panel is stable.  The pt was given her October appointments.

## 2014-03-31 ENCOUNTER — Other Ambulatory Visit: Payer: Self-pay | Admitting: Oncology

## 2014-04-01 ENCOUNTER — Ambulatory Visit (HOSPITAL_COMMUNITY)
Admission: RE | Admit: 2014-04-01 | Discharge: 2014-04-01 | Disposition: A | Discharge status: Home / Self Care | Payer: Medicare Other | Source: Ambulatory Visit | Attending: Diagnostic Radiology | Admitting: Diagnostic Radiology

## 2014-04-01 ENCOUNTER — Other Ambulatory Visit: Payer: Self-pay | Admitting: Oncology

## 2014-04-01 ENCOUNTER — Ambulatory Visit (HOSPITAL_COMMUNITY)
Admission: RE | Admit: 2014-04-01 | Discharge: 2014-04-01 | Disposition: A | Discharge status: Home / Self Care | Payer: Medicare Other | Source: Ambulatory Visit | Attending: Oncology | Admitting: Oncology

## 2014-04-01 DIAGNOSIS — Z853 Personal history of malignant neoplasm of breast: Secondary | ICD-10-CM | POA: Diagnosis not present

## 2014-04-01 DIAGNOSIS — M47817 Spondylosis without myelopathy or radiculopathy, lumbosacral region: Secondary | ICD-10-CM | POA: Insufficient documentation

## 2014-04-01 DIAGNOSIS — M5137 Other intervertebral disc degeneration, lumbosacral region: Secondary | ICD-10-CM | POA: Insufficient documentation

## 2014-04-01 DIAGNOSIS — C7951 Secondary malignant neoplasm of bone: Secondary | ICD-10-CM | POA: Diagnosis not present

## 2014-04-01 DIAGNOSIS — M545 Low back pain, unspecified: Secondary | ICD-10-CM | POA: Diagnosis present

## 2014-04-01 DIAGNOSIS — M51379 Other intervertebral disc degeneration, lumbosacral region without mention of lumbar back pain or lower extremity pain: Secondary | ICD-10-CM | POA: Insufficient documentation

## 2014-04-01 DIAGNOSIS — J984 Other disorders of lung: Secondary | ICD-10-CM

## 2014-04-01 DIAGNOSIS — C7952 Secondary malignant neoplasm of bone marrow: Secondary | ICD-10-CM

## 2014-04-01 DIAGNOSIS — M8580 Other specified disorders of bone density and structure, unspecified site: Secondary | ICD-10-CM

## 2014-04-01 DIAGNOSIS — C50919 Malignant neoplasm of unspecified site of unspecified female breast: Secondary | ICD-10-CM | POA: Diagnosis not present

## 2014-04-01 DIAGNOSIS — M899 Disorder of bone, unspecified: Secondary | ICD-10-CM | POA: Diagnosis not present

## 2014-04-01 DIAGNOSIS — M949 Disorder of cartilage, unspecified: Secondary | ICD-10-CM

## 2014-04-01 DIAGNOSIS — I6789 Other cerebrovascular disease: Secondary | ICD-10-CM | POA: Diagnosis not present

## 2014-04-01 MED ORDER — GADOBENATE DIMEGLUMINE 529 MG/ML IV SOLN
16.0000 mL | Freq: Once | INTRAVENOUS | Status: AC | PRN
  Administered 2014-04-01: 16 mL via INTRAVENOUS

## 2014-04-14 ENCOUNTER — Other Ambulatory Visit: Payer: Self-pay | Admitting: Oncology

## 2014-04-21 ENCOUNTER — Encounter: Payer: Self-pay | Admitting: *Deleted

## 2014-04-21 ENCOUNTER — Telehealth: Payer: Self-pay | Admitting: Oncology

## 2014-04-21 ENCOUNTER — Other Ambulatory Visit: Payer: Self-pay | Admitting: *Deleted

## 2014-04-21 ENCOUNTER — Encounter: Payer: Self-pay | Admitting: Nurse Practitioner

## 2014-04-21 ENCOUNTER — Other Ambulatory Visit: Payer: Self-pay | Admitting: Oncology

## 2014-04-21 ENCOUNTER — Other Ambulatory Visit (HOSPITAL_BASED_OUTPATIENT_CLINIC_OR_DEPARTMENT_OTHER): Payer: Medicare Other

## 2014-04-21 ENCOUNTER — Ambulatory Visit (HOSPITAL_BASED_OUTPATIENT_CLINIC_OR_DEPARTMENT_OTHER): Payer: Medicare Other | Admitting: Nurse Practitioner

## 2014-04-21 VITALS — BP 115/72 | HR 76 | Temp 98.3°F | Resp 18 | Ht 65.0 in | Wt 171.4 lb

## 2014-04-21 DIAGNOSIS — C50919 Malignant neoplasm of unspecified site of unspecified female breast: Secondary | ICD-10-CM

## 2014-04-21 DIAGNOSIS — R5383 Other fatigue: Secondary | ICD-10-CM | POA: Diagnosis not present

## 2014-04-21 DIAGNOSIS — R7309 Other abnormal glucose: Secondary | ICD-10-CM | POA: Diagnosis not present

## 2014-04-21 DIAGNOSIS — M1610 Unilateral primary osteoarthritis, unspecified hip: Secondary | ICD-10-CM | POA: Diagnosis not present

## 2014-04-21 DIAGNOSIS — C50419 Malignant neoplasm of upper-outer quadrant of unspecified female breast: Secondary | ICD-10-CM

## 2014-04-21 DIAGNOSIS — M8580 Other specified disorders of bone density and structure, unspecified site: Secondary | ICD-10-CM

## 2014-04-21 DIAGNOSIS — M899 Disorder of bone, unspecified: Secondary | ICD-10-CM

## 2014-04-21 DIAGNOSIS — M169 Osteoarthritis of hip, unspecified: Secondary | ICD-10-CM

## 2014-04-21 DIAGNOSIS — R5381 Other malaise: Secondary | ICD-10-CM | POA: Diagnosis not present

## 2014-04-21 DIAGNOSIS — M949 Disorder of cartilage, unspecified: Secondary | ICD-10-CM | POA: Diagnosis not present

## 2014-04-21 DIAGNOSIS — R319 Hematuria, unspecified: Secondary | ICD-10-CM | POA: Diagnosis not present

## 2014-04-21 LAB — CBC WITH DIFFERENTIAL/PLATELET
BASO%: 0.9 % (ref 0.0–2.0)
Basophils Absolute: 0.1 10*3/uL (ref 0.0–0.1)
EOS%: 1.9 % (ref 0.0–7.0)
Eosinophils Absolute: 0.1 10*3/uL (ref 0.0–0.5)
HEMATOCRIT: 40.1 % (ref 34.8–46.6)
HGB: 13.3 g/dL (ref 11.6–15.9)
LYMPH#: 1 10*3/uL (ref 0.9–3.3)
LYMPH%: 14.2 % (ref 14.0–49.7)
MCH: 26.2 pg (ref 25.1–34.0)
MCHC: 33.2 g/dL (ref 31.5–36.0)
MCV: 79.1 fL — ABNORMAL LOW (ref 79.5–101.0)
MONO#: 0.8 10*3/uL (ref 0.1–0.9)
MONO%: 11.5 % (ref 0.0–14.0)
NEUT#: 5 10*3/uL (ref 1.5–6.5)
NEUT%: 71.5 % (ref 38.4–76.8)
NRBC: 0 % (ref 0–0)
Platelets: 300 10*3/uL (ref 145–400)
RBC: 5.07 10*6/uL (ref 3.70–5.45)
RDW: 14.4 % (ref 11.2–14.5)
WBC: 7 10*3/uL (ref 3.9–10.3)

## 2014-04-21 LAB — BILIRUBIN, DIRECT

## 2014-04-21 LAB — COMPREHENSIVE METABOLIC PANEL (CC13)
ALBUMIN: 3.4 g/dL — AB (ref 3.5–5.0)
ALT: 23 U/L (ref 0–55)
AST: 28 U/L (ref 5–34)
Alkaline Phosphatase: 237 U/L — ABNORMAL HIGH (ref 40–150)
Anion Gap: 11 mEq/L (ref 3–11)
BUN: 19.6 mg/dL (ref 7.0–26.0)
CHLORIDE: 111 meq/L — AB (ref 98–109)
CO2: 20 meq/L — AB (ref 22–29)
CREATININE: 1 mg/dL (ref 0.6–1.1)
Calcium: 10.1 mg/dL (ref 8.4–10.4)
Glucose: 145 mg/dl — ABNORMAL HIGH (ref 70–140)
POTASSIUM: 3.8 meq/L (ref 3.5–5.1)
Sodium: 141 mEq/L (ref 136–145)
TOTAL PROTEIN: 7.9 g/dL (ref 6.4–8.3)
Total Bilirubin: 0.34 mg/dL (ref 0.20–1.20)

## 2014-04-21 LAB — LIPID PANEL
Cholesterol: 220 mg/dL — ABNORMAL HIGH (ref 0–200)
HDL: 47 mg/dL (ref >39)
LDL CALC: 128 mg/dL — AB (ref 0–99)
Total CHOL/HDL Ratio: 4.7 Ratio
Triglycerides: 223 mg/dL — ABNORMAL HIGH (ref <150)
VLDL: 45 mg/dL — AB (ref 0–40)

## 2014-04-21 LAB — CK: Total CK: 80 U/L (ref 7–177)

## 2014-04-21 LAB — URIC ACID (CC13): URIC ACID, SERUM: 3.7 mg/dL (ref 2.6–7.4)

## 2014-04-21 LAB — PHOSPHORUS: Phosphorus: 3.3 mg/dL (ref 2.3–4.6)

## 2014-04-21 NOTE — Telephone Encounter (Signed)
, °

## 2014-04-21 NOTE — Progress Notes (Signed)
04/21/14 at 10:35am - [employer] - [facility], Cycle 34, day 1 study notes - The pt was into the cancer center this am for her fasting labs and cycle 34 assessments. The pt confirmed that she was fasting upon arrival to the cancer center for her labs. The pt returned her cycle 33 everolimus study drug box. She confirmed that she took her everolimus (10 mg) every day (except on 04/15/14) along with letrozole (2.5 mg) from 03/24/14 - 04/20/14 (28 days). The pt said that she went to go take her study drugs on 04/15/14, but she simply got side-tracked every time she attempted to take them on that day.  The pt was very apologetic for missing a day of her study drugs.  The pt's everolimus drug box had 14 remaining (unopened) blister packs. The pt was dispensed 70 pills - 56 doses (27 days x 2 doses) = 14 returned. Therefore, the pt is 96% compliant with her study drug, everolimus, for cycle 33. The pt's everolimus was taken to the pharmacy for the drug accountability check and storage. The pt was seen and examined today by Dr. Virgie Dad advanced practice provider, Susanne Borders.   Susanne Borders, NP reviewed the pt's labs, and she felt that her abnormal lab values were "not clinically significant". The pt continues to lose some weight and watches her sugar intake. Her glucose was elevated at 145 today. It was decided to obtain another hemoglobin A1C at her next visit to monitor her hyperglycemia.  The pt's vitals were stable, and her mean BP was obtained for study purposes. The pt denies any new adverse events.  She reports the following AE's as ongoing: hot flashes, memory impairment, intermittent nausea, arthritis in hands, fatigue, back pain, and bilateral knee pain. The pt denies any new medications. She reports the following concomitant medications as ongoing: Lopressor, compazine prn, immodium prn, lopid, ibuprofen prn, and vitamin D3. She is performing all of her usual activities with some limitations. ECOG=1. The pt  said that she washed her car inside and outside yesterday.  The pt was dispensed her everolimus drug boxes (2 boxes now) for self administration for cycle 34. The pt's lipid panel is pending. The pt is aware of her October appointments.  T  04/21/14 at 3:00p  - The pt's lipid panel is stable.

## 2014-04-21 NOTE — Progress Notes (Signed)
[place]  [phone] [phone]     ID: Barbara Parks DOB: [birth year]  MR#: [id number]  FIE#:[id number]  Patient Care Team: Marjorie Smolder, MD as PCP - General (Family Medicine) Candee Furbish, MD (Cardiology) Eston Esters, MD (Hematology and Oncology) Gaye Pollack, MD (Cardiothoracic Surgery)  OTHER MD: Dr Erroll Luna- Merlene Laughter, DDS  CHIEF COMPLAINT: metastatic breast cancer, on BOLERO study  CURRENT TREATMENT: letrozole + everolimus  BREAST CANCER HISTORY: From Dr. Collier [name]'s original intake note 04/13/2004:  "This woman has been in good health all of her life. She recently moved from [place] to work here.  She palpated a mass at about the 12 o'clock position in mid-July. She has not noticed any nipple retraction or skin changes.  She was seen by her primary care doctor who subsequently referred her for a mammogram.  Mammogram was performed on 03/01/04. This demonstrated a spiculated 2 cm mass at the 12 o'clock position in the right breast.  Upper outer quadrant of the left breast shows some distortion.   Physical exam at that time showed a firm, nontender nodule at the 12 o'clock position in the right breast, 5 cm from the nipple.  Ultrasound of this area showed a hypoechoic ill-defined mass, measuring 2.2 x 1.3 x 1.4 cm.  Physical exam of the left breast showed general vague thickening, upper outer quadrant of the left breast with a discrete palpable mass. The ultrasound performed showed a single hypoechoic ill-defined nodule at the 1 o'clock position, measuring 7 x 5 x 6 mm.  She had biopsies of both lesions on 03/02/03.  Needle core biopsy of the lesion on the right breast revealed invasive mammary carcinoma.  Needle core biopsy of the left breast showed a complex fibroadenoma.  Prognostic panel of the lesion on the right breast showed it to be ER positive at 73%, PR positive at 90% and proliferative index 9%, HER-2 was 1+.  Patient was  referred to Dr. Annamaria Boots, who performed a simple mastectomy with sentinel lymph node evaluation on 03/22/04.  Final pathology showed this to be an invasive ductal carcinoma  with lobular features, measuring 2.2 cm, grade 2 of 3.  Margins negative for carcinoma.  Invasive ductal carcinoma was extended to involve deep dermis of the nipple.  Lymphovascular invasion was identified.  Total of 4 sentinel lymph nodes were evaluated.  Touch imprints at the time of the OR was felt to be negative.  Subsequent evaluation showed a 5 mm focus of metastatic carcinoma in one of the four lymph nodes on microscopic after sectioning.  There was extracapsular extension  of one lymph node as well. The remaining three lymph nodes were all negative."  The patient's subsequent history is detailed below.  INTERVAL HISTORY: Barbara Parks returns to clinic today for follow up of her metastatic breast cancer, accompanied by our research nurse [name]. She continues on the Duncan trial, currently on cycle 34. This consists of everolimus and letrozole. She is tolerating both drugs very well. She frequent hot flashes, but she manages these on her own with cooling techniques such as a fan. She is also often fatigued. She denies mouth sores, rash, cough, vaginal dryness or arthralgias.   REVIEW OF SYSTEMS: Barbara Parks denies fevers, chills, or changes in bowel or bladder habits. She is occasionally nauseous, but this is managed well by compazine PRN. She has not shortness of breath, cough, or palpitations. Her left hip pain continues. She is taking ibuprofen PRN for this pain,  but not daily. While she denies gastritis symptoms, I reminded her to take Prilosec daily to protect her stomach. A detailed review of systems today is otherwise noncontributory.  PAST MEDICAL HISTORY: Past Medical History  Diagnosis Date  . Asthma   . Shortness of breath   . Arthritis     left hip  . Contact dermatitis     from a bandaid that had Latex   . breast  ca 2005    breast/chemo R mastectomy  . Metastasis to lung dx'd 08/2011  . Osteopenia due to cancer therapy 09/09/2013  . Hypercholesteremia   . ADHD (attention deficit hyperactivity disorder)     PAST SURGICAL HISTORY: Past Surgical History  Procedure Laterality Date  . Spine surgery  1996  . Breast surgery  2005    right  . Video bronchoscopy  09/11/2011    Procedure: VIDEO BRONCHOSCOPY;  Surgeon: Gaye Pollack, MD;  Location: [facility];  Service: Thoracic;  Laterality: N/A;  . Chest tube insertion  09/11/2011    Procedure: INSERTION PLEURAL DRAINAGE CATHETER;  Surgeon: Gaye Pollack, MD;  Location: [place];  Service: Thoracic;  Laterality: Right;  . Pericardial window  09/11/2011    Procedure: PERICARDIAL WINDOW;  Surgeon: Gaye Pollack, MD;  Location: [place] [facility];  Service: Thoracic;  Laterality: N/A;  . Removal of pleural drainage catheter  12/19/2011    Procedure: REMOVAL OF PLEURAL DRAINAGE CATHETER;  Surgeon: Gaye Pollack, MD;  Location: [facility];  Service: Thoracic;  Laterality: Right;  TO BE DONE IN MINOR ROOM, SHORT STAY  . Cataract extraction w/phaco Left 01/18/2014    Procedure: CATARACT EXTRACTION PHACO AND INTRAOCULAR LENS PLACEMENT (IOC);  Surgeon: Elta Guadeloupe T. Gershon Crane, MD;  Location: [facility];  Service: Ophthalmology;  Laterality: Left;  CDE 15.79  . Cataract extraction w/phaco Right 02/08/2014    Procedure: CATARACT EXTRACTION PHACO AND INTRAOCULAR LENS PLACEMENT (IOC);  Surgeon: Elta Guadeloupe T. Gershon Crane, MD;  Location: [facility];  Service: Ophthalmology;  Laterality: Right;  CDE 4.41    FAMILY HISTORY Family History  Problem Relation Age of Onset  . Cancer Mother     breast  . Heart disease Father   . Anesthesia problems Neg Hx    the patient's mother was diagnosed with breast cancer at age 18, she died age 64 from congestive heart failure..The patient's father died from heart disease at age 38.  She has one sister alive & well.  Two brothers alive & well, one with diabetes. The patient's sister was  also diagnosed with breast cancer, and was tested for the BRCA gene, and was negative. The patient herself has not been tested. There is no history of ovarian cancer in the family  GYNECOLOGIC HISTORY:  No LMP recorded. Patient is postmenopausal. Menarche age 81, the patient is GX P0. She stopped having periods with chemotherapy in 2005. 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 She was a Archivist. She is single, lives alone with her Shitzu-poodle Sammie.. Family is all in the [place] area.     ADVANCED DIRECTIVES: Not in place. At the 02/23/2014 visit the patient was given the appropriate documents to complete and notarize at her discretion. She tells me she is planning to name her sister, Billie Ruddy, as healthcare power of attorney. [name] can be reached at [phone]   HEALTH MAINTENANCE: History  Substance Use Topics  .  Smoking status: Former Smoker -- 1.50 packs/day for 30 years    Types: Cigarettes    Quit date: 09/10/2007  . Smokeless tobacco: Not on file  . Alcohol Use: No     Colonoscopy:  PAP:  Bone density: 08/11/2013, showed osteopenia  Lipid panel:  Allergies  Allergen Reactions  . Aspirin     REACTION: upset stomach  . Latex Other (See Comments)    Blistering and skin peels off  . Nsaids Nausea And Vomiting    Extreme nausea and vomiting    Current Outpatient Prescriptions  Medication Sig Dispense Refill  . cholecalciferol (VITAMIN D) 1000 UNITS tablet Take 2,000 Units by mouth daily.       Marland Kitchen gemfibrozil (LOPID) 600 MG tablet Take 1 tablet (600 mg total) by mouth 2 (two) times daily before a meal.  90 tablet  0  . ibuprofen (ADVIL,MOTRIN) 200 MG tablet Take 400 mg by mouth every 6 (six) hours as needed. For pain      . Investigational everolimus (RAD001) 5 MG tablet Novartis [id number] Take 2 tablets by mouth daily. Take with a glass  of water.  70 tablet  0  . letrozole (FEMARA) 2.5 MG tablet Take 1 tablet (2.5 mg total) by mouth daily.  90 tablet  4  . loperamide (IMODIUM) 2 MG capsule Take 2 mg by mouth 4 (four) times daily as needed for diarrhea or loose stools.      . metoprolol tartrate (LOPRESSOR) 25 MG tablet Take 0.5 tablets (12.5 mg total) by mouth daily.  30 tablet  4  . omeprazole (PRILOSEC) 40 MG capsule Take 1 capsule (40 mg total) by mouth daily.  90 capsule  4  . Bromfenac Sodium (PROLENSA) 0.07 % SOLN Apply 1 drop to eye every morning. Pt instills 1 drop to left eye daily.       No current facility-administered medications for this visit.    OBJECTIVE: Middle-aged white woman who appears stated age 59 Vitals:   04/21/14 0906  BP: 115/72  Pulse:   Temp:   Resp:      Body mass index is 28.52 kg/(m^2).    ECOG FS:1 - Symptomatic but completely ambulatory  Skin: warm, dry  HEENT: sclerae anicteric, conjunctivae pink, oropharynx clear. No thrush or mucositis.  Lymph Nodes: No cervical or supraclavicular lymphadenopathy  Lungs: clear to auscultation bilaterally, no rales, wheezes, or rhonci  Heart: regular rate and rhythm  Abdomen: round, soft, non tender, positive bowel sounds  Musculoskeletal: No focal spinal tenderness, no peripheral edema  Neuro: non focal, well oriented, positive affect  Breasts: deferred  LAB RESULTS:  CMP     Component Value Date/Time   NA 141 04/21/2014 0815   NA 136 07/16/2012 0910   K 3.8 04/21/2014 0815   K 4.0 07/16/2012 0910   CL 109* 12/31/2012 0940   CL 103 07/16/2012 0910   CO2 20* 04/21/2014 0815   CO2 21 07/16/2012 0910   GLUCOSE 145* 04/21/2014 0815   GLUCOSE 127* 12/31/2012 0940   GLUCOSE 140* 07/16/2012 0910   BUN 19.6 04/21/2014 0815   BUN 16 07/16/2012 0910   CREATININE 1.0 04/21/2014 0815   CREATININE 0.75 07/16/2012 0910   CALCIUM 10.1 04/21/2014 0815   CALCIUM 10.1 07/16/2012 0910   PROT 7.9 04/21/2014 0815   PROT 8.0 07/16/2012 0910   ALBUMIN 3.4*  04/21/2014 0815   ALBUMIN 3.7 07/16/2012 0910   AST 28 04/21/2014 0815   AST 30 07/16/2012 0910   ALT  23 04/21/2014 0815   ALT 33 07/16/2012 0910   ALKPHOS 237* 04/21/2014 0815   ALKPHOS 142* 07/16/2012 0910   BILITOT 0.34 04/21/2014 0815   BILITOT 0.2* 07/16/2012 0910   GFRNONAA >90 09/13/2011 0440   GFRAA >90 09/13/2011 0440    I No results found for this basename: SPEP,  UPEP,   kappa and lambda light chains    Lab Results  Component Value Date   WBC 7.0 04/21/2014   NEUTROABS 5.0 04/21/2014   HGB 13.3 04/21/2014   HCT 40.1 04/21/2014   MCV 79.1* 04/21/2014   PLT 300 04/21/2014      Chemistry      Component Value Date/Time   NA 141 04/21/2014 0815   NA 136 07/16/2012 0910   K 3.8 04/21/2014 0815   K 4.0 07/16/2012 0910   CL 109* 12/31/2012 0940   CL 103 07/16/2012 0910   CO2 20* 04/21/2014 0815   CO2 21 07/16/2012 0910   BUN 19.6 04/21/2014 0815   BUN 16 07/16/2012 0910   CREATININE 1.0 04/21/2014 0815   CREATININE 0.75 07/16/2012 0910      Component Value Date/Time   CALCIUM 10.1 04/21/2014 0815   CALCIUM 10.1 07/16/2012 0910   ALKPHOS 237* 04/21/2014 0815   ALKPHOS 142* 07/16/2012 0910   AST 28 04/21/2014 0815   AST 30 07/16/2012 0910   ALT 23 04/21/2014 0815   ALT 33 07/16/2012 0910   BILITOT 0.34 04/21/2014 0815   BILITOT 0.2* 07/16/2012 0910       Lab Results  Component Value Date   LABCA2 58* 09/14/2012    No components found with this basename: [id number]    No results found for this basename: INR,  in the last 168 hours  Urinalysis    Component Value Date/Time   COLORURINE YELLOW 09/11/2011 [place] 09/11/2011 0819   LABSPEC 1.030 11/14/2011 1101   LABSPEC 1.013 09/11/2011 0819   PHURINE 6.5 09/11/2011 0819   GLUCOSEU NEGATIVE 09/11/2011 0819   HGBUR SMALL* 09/11/2011 [place] 09/11/2011 0819   KETONESUR 15* 09/11/2011 0819   PROTEINUR NEGATIVE 09/11/2011 0819   UROBILINOGEN 0.2 09/11/2011 0819   NITRITE NEGATIVE 09/11/2011  0819   LEUKOCYTESUR NEGATIVE 09/11/2011 0819    STUDIES: .Ct Chest W Contrast  03/22/2014   ADDENDUM REPORT: 03/22/2014 14:29  ADDENDUM: Target lesions:  1. Right lower lobe pulmonary nodule measures 6 mm on image number 41. It measures 6 mm on the prior study.  2. Partially calcified subcarinal lymph node measures 6 mm on image number 28. This is unchanged.  3. Right hilar lymph node on image number 27 measures 5 mm. It previously measured 4 mm.   Electronically Signed   By: Kalman Jewels M.D.   On: 03/22/2014 14:29   03/22/2014   CLINICAL DATA:  Breast cancer.  EXAM: CT CHEST, ABDOMEN, AND PELVIS WITH CONTRAST  TECHNIQUE: Multidetector CT imaging of the chest, abdomen and pelvis was performed following the standard protocol during bolus administration of intravenous contrast.  CONTRAST:  125m OMNIPAQUE IOHEXOL 300 MG/ML  SOLN  COMPARISON:  01/25/2014  FINDINGS: Recist 1.0  Target lesions:  1. Right lower lobe pulmonary nodule measures 6.5 mm on image number 41. It measured 6.3 mm on the prior study. 2. Partially calcified subcarinal lymph node measures 6.5 mm on image number 28. This is unchanged. 3. Right hilar lymph node on image number 27 measures 5 mm. It previously measured 4.5 mm. Non  target lesions:  Non measurable.  CT CHEST FINDINGS  Stable surgical changes from a right mastectomy. No chest wall masses, supraclavicular or axillary lymphadenopathy. The left breasts is grossly normal. A small nodule in the mid breast is unchanged. The bony thorax is intact. No destructive bone lesions or spinal canal compromise. Stable degenerative changes involving the spine.  The heart is normal and stable in size. No pericardial effusion. No mediastinal or hilar mass or adenopathy. Small scattered lymph nodes are unchanged. The esophagus is grossly normal. The aorta is normal in caliber. No dissection.  Examination of the lung parenchyma demonstrates a stable 6.5 mm nodule in the right lower lobe. No new or  worrisome pulmonary lesions. No acute pulmonary findings. No pleural effusion.  CT ABDOMEN AND PELVIS FINDINGS  Diffuse fatty infiltration of the liver but no focal hepatic lesions or intrahepatic biliary dilatation. Stable areas of focal fatty sparing. The gallbladder is stable. There is a small nodule along the anterior wall. This is most likely a polyp or focal adenomyoma. No common bile duct dilatation. The pancreas is normal and stable. The spleen is normal in size. No focal lesions. There is a stable left adrenal gland nodule. The right adrenal gland is normal. Both kidneys are unremarkable.  The stomach, duodenum, small bowel and colon are unremarkable. No inflammatory changes, mass lesions or obstructive findings. No mesenteric or retroperitoneal mass or adenopathy. The aorta and branch vessels are patent. The major venous structures are patent.  The uterus and ovaries are normal. The bladder is normal. No pelvic mass, adenopathy or free pelvic fluid collections. No inguinal mass or adenopathy.  Examination the bony structures demonstrates advanced degenerative changes involving the left hip. No acute or worrisome bone findings. Stable faintly sclerotic lesion involving the left sacrum.  IMPRESSION: 1. Stable CT appearance of the chest, abdomen and pelvis. No findings for recurrent or metastatic disease. 2. Stable sclerotic lesion in the left sacrum. 3. Stable heterogeneous hepatic steatosis.  Electronically Signed: By: Loralie Champagne M.D. On: 03/22/2014 12:10   Mr Laqueta Jean WH Contrast  04/02/2014   CLINICAL DATA:  History of breast cancer. Follow-up. No new symptoms.  EXAM: MRI HEAD WITHOUT AND WITH CONTRAST  TECHNIQUE: Multiplanar, multiecho pulse sequences of the brain and surrounding structures were obtained without and with intravenous contrast.  CONTRAST:  107mL MULTIHANCE GADOBENATE DIMEGLUMINE 529 MG/ML IV SOLN  COMPARISON:  08/21/2012 and 10/03/2011 brain MR.  FINDINGS: No acute infarct.  No  intracranial hemorrhage.  No intracranial mass or bony destructive lesion to suggest presence of intracranial metastatic disease.  Developmental venous anomaly left frontal lobe incidentally noted and unchanged.  Minimal change in scattered punctate and patchy white matter type changes which most likely are related to result of small vessel disease. Other causes of white matter type changes felt to be secondary less likely considerations.  Mild cerebellar tonsil ectopia unchanged.  No hydrocephalus.  Major intracranial vascular structures are patent.  Post lens replacement otherwise orbital structures unremarkable.  IMPRESSION: No evidence of intracranial metastatic disease.  Left frontal developmental venous anomaly incidentally noted and unchanged.  Slight progression of small vessel disease type changes.  Mild cerebellar tonsil ectopia stable in appearance.   Electronically Signed   By: Bridgett Larsson M.D.   On: 04/02/2014 10:02   Mr Lumbar Spine W Wo Contrast  04/02/2014   CLINICAL DATA:  Left leg pain. History of disc protrusion. History of breast cancer. History of back surgery.  EXAM: MRI LUMBAR SPINE  WITHOUT AND WITH CONTRAST  TECHNIQUE: Multiplanar and multiecho pulse sequences of the lumbar spine were obtained without and with intravenous contrast.  CONTRAST:  16 cc MultiHance  COMPARISON:  CT scan of 03/22/2014  FINDINGS: The lowest lumbar type non-rib-bearing vertebra is labeled as L5. The conus medullaris appears normal. Conus level: Upper L2 level.  Enhancing T2 hyperintense left-sided lesion at the S2 level, 2.0 cm, favors bony metastatic lesion.  Type 2 degenerative endplate findings noted at L3-4 with associated loss of intervertebral disc height.  No vertebral subluxation is observed.  Additional findings at individual levels are as follows:  L1-2:  Unremarkable.  L2-3: No impingement. Mild disc bulge. Shallow left lateral recess disc protrusion.  L3-4: No impingement. Posterior decompression. Disc  bulge with intervertebral and facet spurring.  L4-5: Borderline left subarticular lateral recess stenosis due to disc bulge and facet arthropathy. Borderline right foraminal stenosis due to same.  L5-S1: Mild bilateral subarticular lateral recess stenosis along with mild right and borderline left foraminal stenosis due to facet arthropathy, intervertebral spurring, and right inferior foraminal and lateral recess disc protrusion.  IMPRESSION: 1. Lumbar spondylosis and degenerative disc disease, causing mild impingement at L5-S1 and borderline impingement at L4-5, as noted above. 2. Stable appearance of the 2 cm metastatic lesion in the left side of the sacrum at the S2 level.   Electronically Signed   By: Sherryl Barters M.D.   On: 04/02/2014 10:18   Ct Abdomen Pelvis W Contrast  03/22/2014   ADDENDUM REPORT: 03/22/2014 14:29  ADDENDUM: Target lesions:  1. Right lower lobe pulmonary nodule measures 6 mm on image number 41. It measures 6 mm on the prior study.  2. Partially calcified subcarinal lymph node measures 6 mm on image number 28. This is unchanged.  3. Right hilar lymph node on image number 27 measures 5 mm. It previously measured 4 mm.   Electronically Signed   By: Kalman Jewels M.D.   On: 03/22/2014 14:29   03/22/2014   CLINICAL DATA:  Breast cancer.  EXAM: CT CHEST, ABDOMEN, AND PELVIS WITH CONTRAST  TECHNIQUE: Multidetector CT imaging of the chest, abdomen and pelvis was performed following the standard protocol during bolus administration of intravenous contrast.  CONTRAST:  165m OMNIPAQUE IOHEXOL 300 MG/ML  SOLN  COMPARISON:  01/25/2014  FINDINGS: Recist 1.0  Target lesions:  1. Right lower lobe pulmonary nodule measures 6.5 mm on image number 41. It measured 6.3 mm on the prior study. 2. Partially calcified subcarinal lymph node measures 6.5 mm on image number 28. This is unchanged. 3. Right hilar lymph node on image number 27 measures 5 mm. It previously measured 4.5 mm. Non target lesions:   Non measurable.  CT CHEST FINDINGS  Stable surgical changes from a right mastectomy. No chest wall masses, supraclavicular or axillary lymphadenopathy. The left breasts is grossly normal. A small nodule in the mid breast is unchanged. The bony thorax is intact. No destructive bone lesions or spinal canal compromise. Stable degenerative changes involving the spine.  The heart is normal and stable in size. No pericardial effusion. No mediastinal or hilar mass or adenopathy. Small scattered lymph nodes are unchanged. The esophagus is grossly normal. The aorta is normal in caliber. No dissection.  Examination of the lung parenchyma demonstrates a stable 6.5 mm nodule in the right lower lobe. No new or worrisome pulmonary lesions. No acute pulmonary findings. No pleural effusion.  CT ABDOMEN AND PELVIS FINDINGS  Diffuse fatty infiltration of the liver but  no focal hepatic lesions or intrahepatic biliary dilatation. Stable areas of focal fatty sparing. The gallbladder is stable. There is a small nodule along the anterior wall. This is most likely a polyp or focal adenomyoma. No common bile duct dilatation. The pancreas is normal and stable. The spleen is normal in size. No focal lesions. There is a stable left adrenal gland nodule. The right adrenal gland is normal. Both kidneys are unremarkable.  The stomach, duodenum, small bowel and colon are unremarkable. No inflammatory changes, mass lesions or obstructive findings. No mesenteric or retroperitoneal mass or adenopathy. The aorta and branch vessels are patent. The major venous structures are patent.  The uterus and ovaries are normal. The bladder is normal. No pelvic mass, adenopathy or free pelvic fluid collections. No inguinal mass or adenopathy.  Examination the bony structures demonstrates advanced degenerative changes involving the left hip. No acute or worrisome bone findings. Stable faintly sclerotic lesion involving the left sacrum.  IMPRESSION: 1. Stable CT  appearance of the chest, abdomen and pelvis. No findings for recurrent or metastatic disease. 2. Stable sclerotic lesion in the left sacrum. 3. Stable heterogeneous hepatic steatosis.  Electronically Signed: By: Kalman Jewels M.D. On: 03/22/2014 12:10     ASSESSMENT: 63 y.o. Barbara Parks, Barbara Parks woman with stage IV breast cancer, on BOLERO-4 trial  (1) status post right mastectomy and sentinel lymph node sampling 03/22/2004 for a pT2 pN1, stage IIB invasive ductal carcinoma with lobular features, grade 2, estrogen receptor and progesterone receptor positive, HER-2 negative, with an MIB-1 of 9% ([id number] and [id number])  (2) addition all right axillary lymph node sampling 05/07/2004 showed 2 benign lymph nodes (4 lymph nodes previously removed, so total was one positive lymph node out of 6; [id number])  (3) the patient was evaluated by radiation oncology; no postmastectomy radiation recommended  (4) adjuvant chemotherapy with dose dense doxorubicin and cyclophosphamide x4 cycles (first cycle delayed one week) followed by dose dense paclitaxel x4 was completed 09/18/2004  (5) tamoxifen started March 2006, discontinued 2009  METASTATIC DISEASE: (6) presenting with a large pericardial effusion, large right pleural effusion and possible right middle lobe bronchial obstruction February 2013, status post pericardial window placement, fiberoptic bronchoscopy and right Pleurx placement 09/11/2011,, with biopsy of the bronchus intermedius and pericardial positive for metastatic breast cancer, estrogen receptor 91% positive with moderate staining intensity, progesterone receptor 100% positive with strong staining intensity, with an MIB-1 of 35%, and no HER-2 amplification, the signals ratio being 1.37 ([id number])  (7) enrolled in BOLERO-4 trial 10/10/2011, receiving letrozole and everolimus  (a) two small areas of enhancement in the cerebellum noted by brain MRI 10/03/2011 were no longer apparent on repeat MRI  08/21/2012  (b) sclerotic lesions in left iliac bone and sacrum have not been biopsied; stable; plan is to start zolendronate after patient updates her dental care (extraction planned)  (c) RLL lung nodule, R hilar and subcarinal lymph nodes: stable  (9) additional problems:  (a) hepatic steatosis  (b) COPD/ emphysema  (c) advanced L hip osteoarthritis  (d) aortoiliac atherosclerosis  (e) dental evaluation pending w possible dental extractions  (f) possible thalassemia trait   PLAN: Barbara Parks's recent lumbar MRI showed that the lesion on her left sacrum is stable. The MRI of the brain showed neo evidence of metastatic disease. She is tolerating the letrozole and everolimus well, and will continue this regimen. The uric acid, CMET, and CBC were reviewed in detail and were stable. The rest of the labs were unavailable to view  at this time. Her glucose today was 145, and she states that she has lost weight and has been diligent with her diet. Her [place] in July was 8.1. We will recheck this level next month. If there is no improvement, we will consider starting her on metformin.   Again we discussed her need for dental care. She knows she needs at least one tooth pulled, but that her dentist recommends she has full dental surgery for this procedure. She is unable to afford that at this time, but knows she needs it done before we start zolendronate. I will refer her to social work so that they might refer her to one of the low cost surgical clinics at [facility].  Barbara Parks will return next month for more labs and a follow up visit with Dr. Jana Hakim. She will have repeat CT scans of the abdomen, pelvis, and chest performed 2 days before that visit. She understands and agrees with this plan. She knows the goal of treatment in her case is control. She has been encouraged to call with any issues that might arise before her next visit here.    Marcelino Duster, NP   04/21/2014 9:13 AM

## 2014-04-22 ENCOUNTER — Other Ambulatory Visit: Payer: Self-pay | Admitting: Oncology

## 2014-04-25 ENCOUNTER — Other Ambulatory Visit: Payer: Self-pay | Admitting: Oncology

## 2014-04-25 MED ORDER — INV-EVEROLIMUS (RAD0001) 5MG TABLET NOVARTIS CRAD001Y24135: 2.0000 | ORAL_TABLET | Freq: Every day | ORAL | Status: DC

## 2014-05-03 ENCOUNTER — Other Ambulatory Visit: Payer: Self-pay | Admitting: Oncology

## 2014-05-06 ENCOUNTER — Other Ambulatory Visit: Payer: Self-pay | Admitting: *Deleted

## 2014-05-17 ENCOUNTER — Other Ambulatory Visit: Payer: Self-pay | Admitting: *Deleted

## 2014-05-17 ENCOUNTER — Encounter: Payer: Self-pay | Admitting: *Deleted

## 2014-05-17 ENCOUNTER — Telehealth: Payer: Self-pay | Admitting: Oncology

## 2014-05-17 ENCOUNTER — Other Ambulatory Visit: Payer: Medicare Other

## 2014-05-17 ENCOUNTER — Ambulatory Visit (HOSPITAL_COMMUNITY)
Admission: RE | Admit: 2014-05-17 | Discharge: 2014-05-17 | Disposition: A | Discharge status: Home / Self Care | Payer: Medicare Other | Source: Ambulatory Visit | Attending: Oncology | Admitting: Oncology

## 2014-05-17 ENCOUNTER — Encounter (HOSPITAL_COMMUNITY): Payer: Self-pay

## 2014-05-17 DIAGNOSIS — C7931 Secondary malignant neoplasm of brain: Secondary | ICD-10-CM | POA: Diagnosis present

## 2014-05-17 DIAGNOSIS — C50919 Malignant neoplasm of unspecified site of unspecified female breast: Secondary | ICD-10-CM

## 2014-05-17 DIAGNOSIS — C50911 Malignant neoplasm of unspecified site of right female breast: Secondary | ICD-10-CM | POA: Diagnosis not present

## 2014-05-17 DIAGNOSIS — Z853 Personal history of malignant neoplasm of breast: Secondary | ICD-10-CM | POA: Diagnosis not present

## 2014-05-17 DIAGNOSIS — C7800 Secondary malignant neoplasm of unspecified lung: Principal | ICD-10-CM

## 2014-05-17 DIAGNOSIS — K760 Fatty (change of) liver, not elsewhere classified: Secondary | ICD-10-CM | POA: Diagnosis not present

## 2014-05-17 MED ORDER — IOHEXOL 300 MG/ML  SOLN
100.0000 mL | Freq: Once | INTRAMUSCULAR | Status: AC | PRN
  Administered 2014-05-17: 100 mL via INTRAVENOUS

## 2014-05-17 MED ORDER — IOHEXOL 300 MG/ML  SOLN
50.0000 mL | Freq: Once | INTRAMUSCULAR | Status: AC | PRN
  Administered 2014-05-17: 50 mL via ORAL

## 2014-05-17 NOTE — Progress Notes (Unsigned)
05/17/14 at 10:31am - The pt was into the radiology department today for her CT scans.  The research nurse met with the pt and discussed the new informed consent form (Version 4, dated Augus 10, 2015).  The pt was given a copy of the consent form and told to read over the consent form before her next scheduled visit on 05/19/14.  The research nurse will meet again with the pt and answer any questions about the new consent form at this visit.  The pt thanked the nurse for giving her the consent form in advance.  The pt was thanked for her continued support of this clinical trial.

## 2014-05-18 IMAGING — CT CT CHEST W/ CM
2 of 5 series · 15 of 46 positions shown, 17 images · IV contrast (omnipaque)
Comparison: None.

***ADDENDUM*** CREATED: 11/05/2012 [DATE]

A request by the clinical research nurse was made with regard to
the chronicity of the left sacral lesion.  In my opinion, this
lesion is highly subtle and only readily recognizable in retrospect
and using non-standard targeted re-windowing, on prior exams
including 10/03/2011.  On the exam from 11/03/2012, this lesion is
more conspicuous than on prior exams, appearing more sclerotic with
respect to the background marrow density, although similar overall
size compared back through 10/03/2011.  This lesion is not visible
on the CT pelvis exam from 08/01/2005. I continue to recommend bone
scan in order to determine whether this may represent a metastatic
lesion versus a benign lesion.
The increased conspicuity of the lesion could simply be a function
of interval effective therapy causing sclerosis, rather than
necessarily being indicative of true "progression" since
10/03/2011.
CLINICAL DATA: Breast cancer.  RECIST 1.0 protocol.
CT CHEST, ABDOMEN AND PELVIS WITH CONTRAST
TECHNIQUE: Multidetector CT imaging of the chest, abdomen and
pelvis was performed following the standard protocol during bolus
administration of intravenous contrast.
Contrast: 50mL OMNIPAQUE IOHEXOL 300 MG/ML  SOLN, 100mL OMNIPAQUE
IOHEXOL 300 MG/ML  SOLN

[Series 2: cap with st · axial · 0.73mm/px · z∈[-578,-42]mm · 12 of 123 slices shown, 14 images]
[im 8/123  soft-tissue]
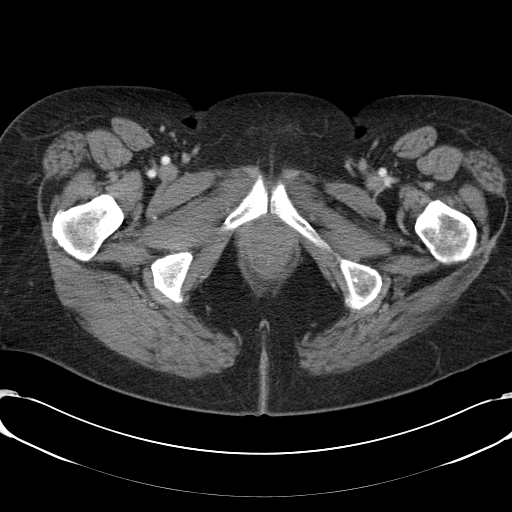
[im 8/123  bone]
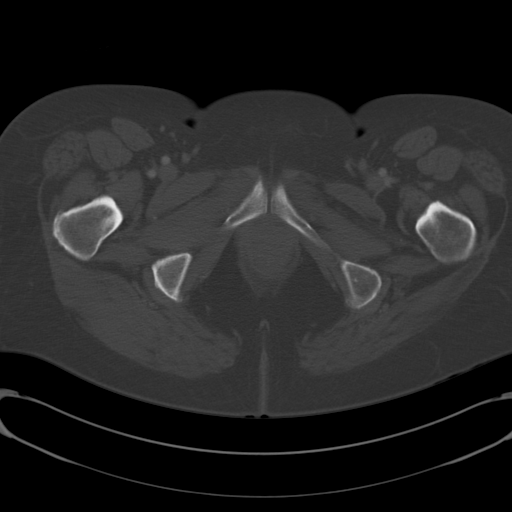
[im 22/123  soft-tissue]
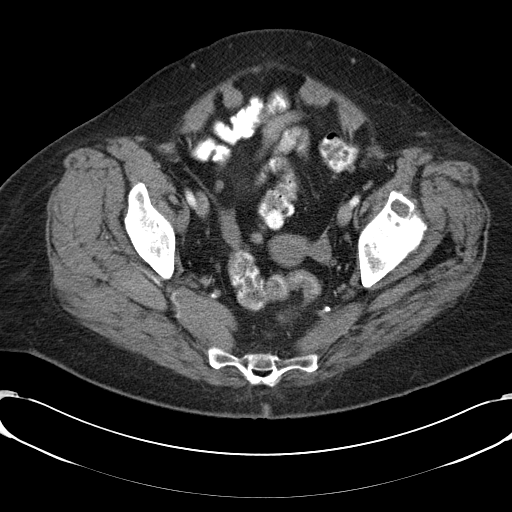
[im 29/123  soft-tissue]
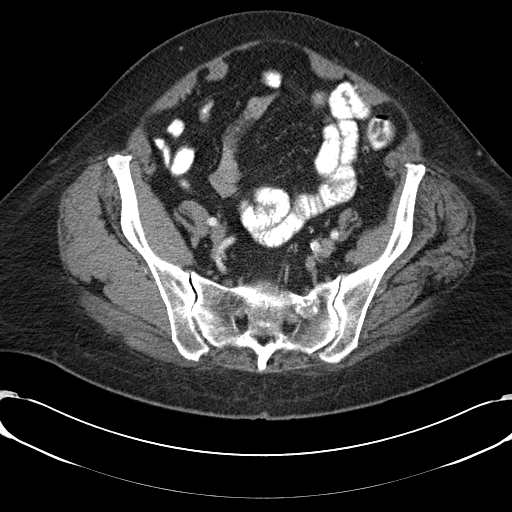
[im 36/123  soft-tissue]
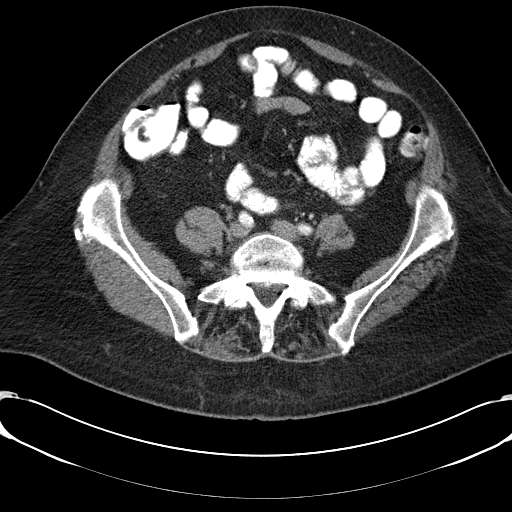
[im 51/123  soft-tissue]
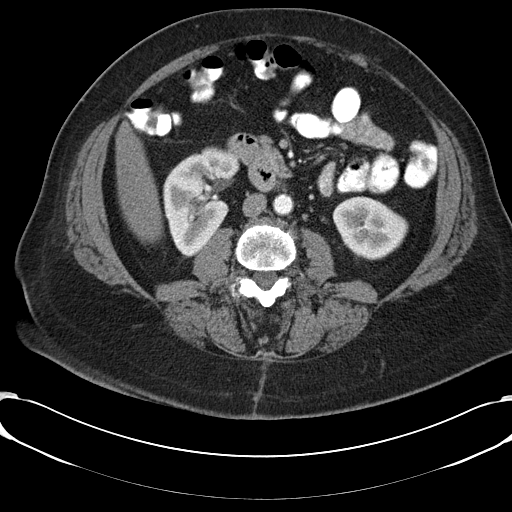
[im 58/123  soft-tissue]
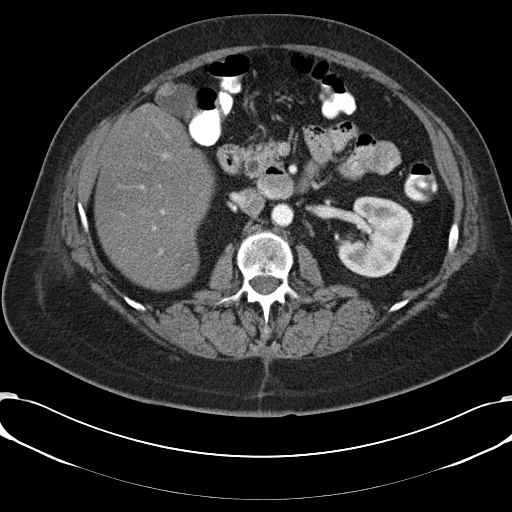
[im 65/123  soft-tissue]
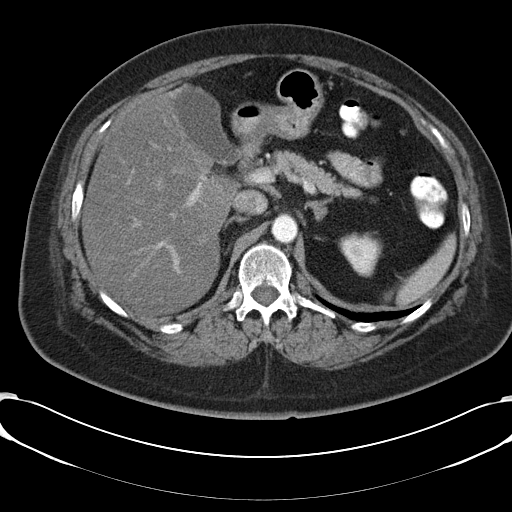
[im 79/123  soft-tissue]
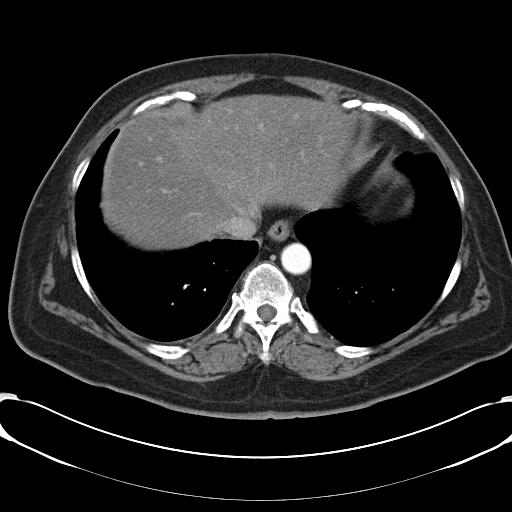
[im 87/123  soft-tissue]
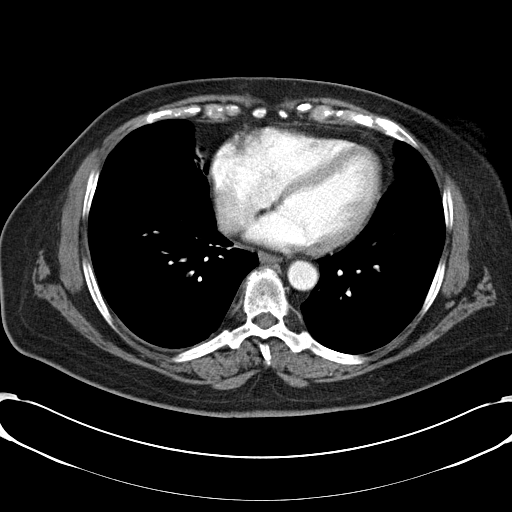
[im 87/123  bone]
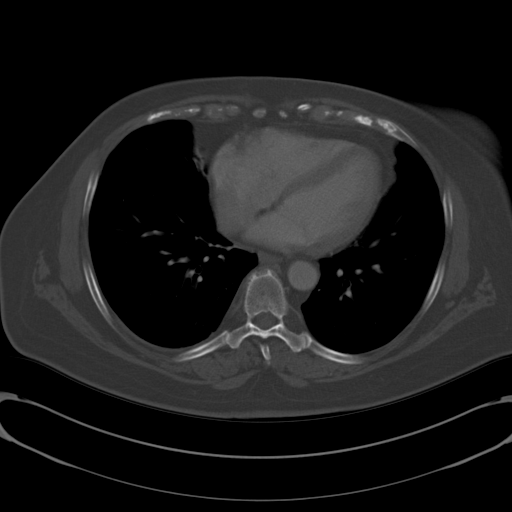
[im 94/123  soft-tissue]
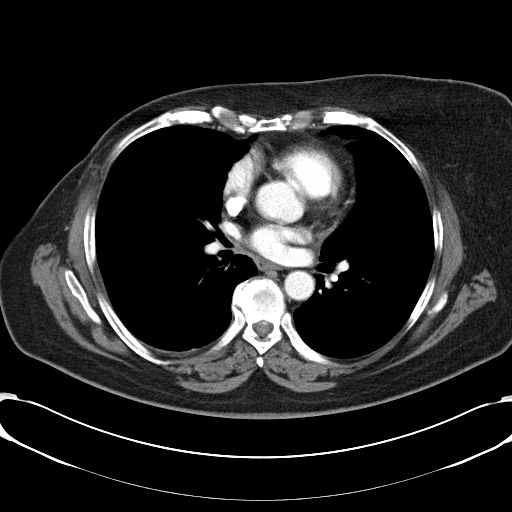
[im 108/123  soft-tissue]
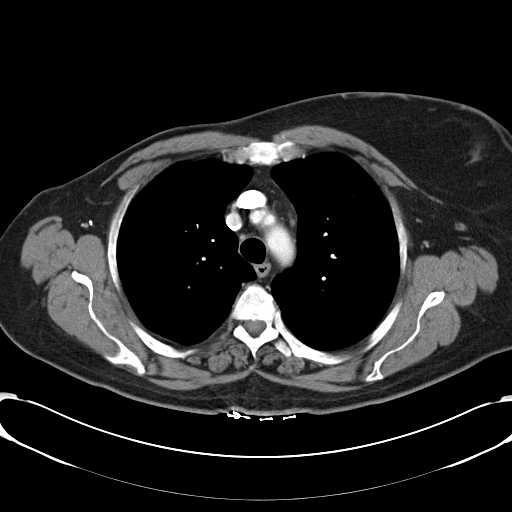
[im 115/123  soft-tissue]
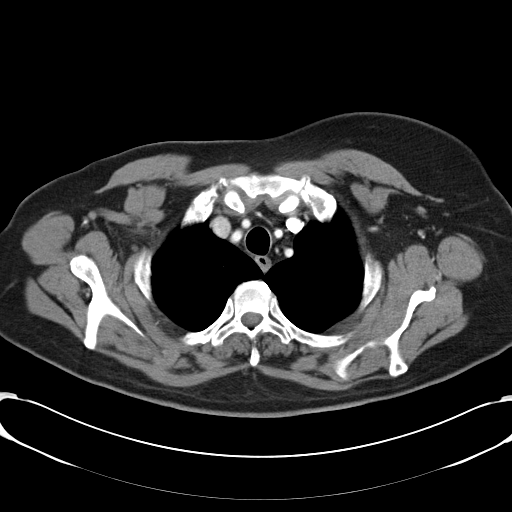

[Series 602: <mpr thick range> · coronal · 1.20mm/px · 3 of 89 slices shown]
[im 30/89  soft-tissue]
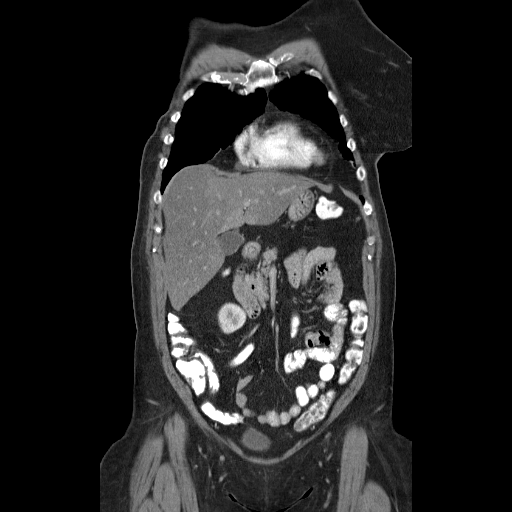
[im 40/89  soft-tissue]
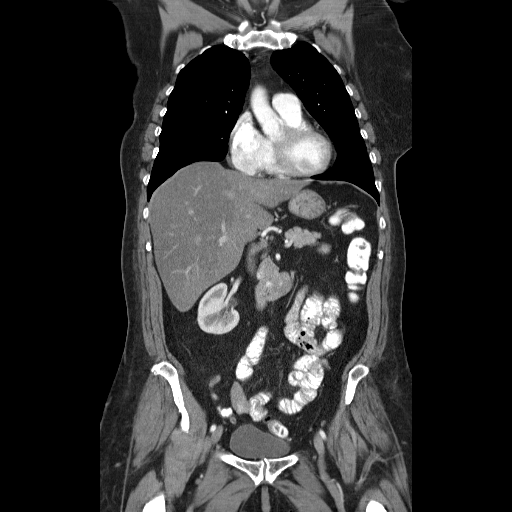
[im 49/89  soft-tissue]
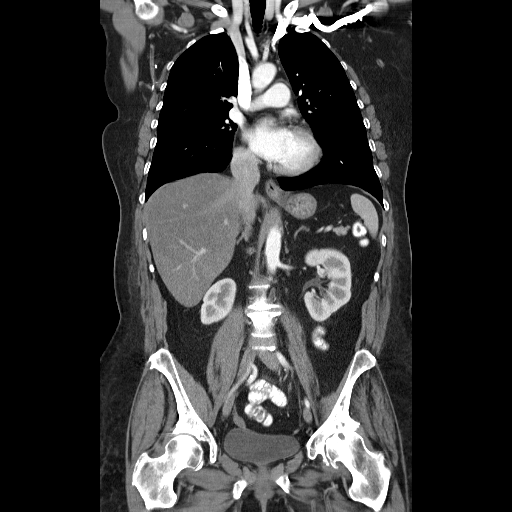

[15 of 46 positions shown; findings below may reference images not displayed]

RECIST protocol

Target Lesions:

1. Right lower lobe lung nodule, image 29 of series [DATE] cm.
2.  Subcarinal lymph node, image 28 of series [DATE] cm short axis.
3.  Right hilar lymph node, image 26 of series [DATE] cm short
axis.

Non Target lesions:

1. Right pleural effusion:  Resolved
2.  Superior segment left lower lobe nodularity:  Resolved

CT CHEST
FINDINGS: Right mastectomy noted.  No pathologic adenopathy in the
chest.  Mild scarring in the superior segment right lower lobe.  No
new nodule.  Sclerosis in the left posteromedial eleventh rib.
IMPRESSION: 1. Right pleural effusion and prior nodularity in the superior
segment left lower lobe are no longer present.  No specific
findings of recurrent malignancy.  Stable appearance of sclerosis
in the left posterior 11th rib and stable appearance of 0.6 cm
nodule along a band of scarring in the right lower lobe as reported
in the Recist 1.0 table above.

CT ABDOMEN AND PELVIS
FINDINGS: Diffuse hepatic steatosis noted with possible linear
fatty sparing in the right hepatic lobe and along the gallbladder
fossa, similar to prior.  Stable fundal gallbladder polyp.  The
spleen, pancreas, and adrenal glands appear unremarkable. The
kidneys appear unremarkable, as do the proximal ureters.

No pathologic retroperitoneal or porta hepatis adenopathy is
identified.

Mild aortoiliac atherosclerotic calcification is present.

No pathologic pelvic adenopathy is identified.

The uterus and adnexa appear unremarkable.

Urinary bladder unremarkable.  Arthropathy of the left hip likely
has a significant erosive component.  This appears stable.

Faint sclerotic lesion in the left iliac wing appears to be
increasing over the past year.  Focal sharply defined sclerotic
lesion of the right iliac crest, no change.

Degenerative disc disease noted at L3-4.
IMPRESSION: 1.  There is a subtle but partially sclerotic lesion of the left
sacrum shown on image 95 of series 2 which is increased over the
past year since the patient's prior bone scan.  Consider whole-body
bone scan for further characterization.
2.  Mixed erosive and sclerotic lesion as in the left acetabulum
and left femoral head, likely due to the erosive arthropathy.
3.  Stable densely sclerotic lesion of the right iliac crests.
4.  Atherosclerosis.
5.  Diffuse hepatic steatosis, likely with some small areas of
fatty sparing.
6.  Stable gallbladder fundus polyp.

## 2014-05-19 ENCOUNTER — Other Ambulatory Visit: Payer: Self-pay | Admitting: Oncology

## 2014-05-19 ENCOUNTER — Encounter: Payer: Self-pay | Admitting: Nurse Practitioner

## 2014-05-19 ENCOUNTER — Ambulatory Visit (HOSPITAL_BASED_OUTPATIENT_CLINIC_OR_DEPARTMENT_OTHER): Payer: Medicare Other | Admitting: Nurse Practitioner

## 2014-05-19 ENCOUNTER — Other Ambulatory Visit (HOSPITAL_BASED_OUTPATIENT_CLINIC_OR_DEPARTMENT_OTHER): Payer: Medicare Other

## 2014-05-19 ENCOUNTER — Encounter: Payer: Self-pay | Admitting: *Deleted

## 2014-05-19 VITALS — BP 123/70 | HR 82 | Temp 97.7°F | Resp 18 | Ht 65.0 in | Wt 172.3 lb

## 2014-05-19 DIAGNOSIS — R739 Hyperglycemia, unspecified: Secondary | ICD-10-CM | POA: Diagnosis not present

## 2014-05-19 DIAGNOSIS — C7951 Secondary malignant neoplasm of bone: Secondary | ICD-10-CM

## 2014-05-19 DIAGNOSIS — I700 Atherosclerosis of aorta: Secondary | ICD-10-CM | POA: Diagnosis not present

## 2014-05-19 DIAGNOSIS — C50412 Malignant neoplasm of upper-outer quadrant of left female breast: Secondary | ICD-10-CM

## 2014-05-19 DIAGNOSIS — C50911 Malignant neoplasm of unspecified site of right female breast: Secondary | ICD-10-CM

## 2014-05-19 DIAGNOSIS — K760 Fatty (change of) liver, not elsewhere classified: Secondary | ICD-10-CM | POA: Diagnosis not present

## 2014-05-19 DIAGNOSIS — C7800 Secondary malignant neoplasm of unspecified lung: Secondary | ICD-10-CM | POA: Diagnosis not present

## 2014-05-19 DIAGNOSIS — C7931 Secondary malignant neoplasm of brain: Secondary | ICD-10-CM

## 2014-05-19 DIAGNOSIS — C7801 Secondary malignant neoplasm of right lung: Secondary | ICD-10-CM | POA: Diagnosis not present

## 2014-05-19 DIAGNOSIS — Z170 Estrogen receptor positive status [ER+]: Secondary | ICD-10-CM

## 2014-05-19 DIAGNOSIS — C50919 Malignant neoplasm of unspecified site of unspecified female breast: Secondary | ICD-10-CM | POA: Diagnosis not present

## 2014-05-19 DIAGNOSIS — R7309 Other abnormal glucose: Secondary | ICD-10-CM | POA: Diagnosis not present

## 2014-05-19 LAB — CBC WITH DIFFERENTIAL/PLATELET
BASO%: 0.9 % (ref 0.0–2.0)
BASOS ABS: 0.1 10*3/uL (ref 0.0–0.1)
EOS ABS: 0.1 10*3/uL (ref 0.0–0.5)
EOS%: 1.7 % (ref 0.0–7.0)
HEMATOCRIT: 41.3 % (ref 34.8–46.6)
HEMOGLOBIN: 13.6 g/dL (ref 11.6–15.9)
LYMPH#: 1 10*3/uL (ref 0.9–3.3)
LYMPH%: 14.9 % (ref 14.0–49.7)
MCH: 26.2 pg (ref 25.1–34.0)
MCHC: 32.9 g/dL (ref 31.5–36.0)
MCV: 79.4 fL — ABNORMAL LOW (ref 79.5–101.0)
MONO#: 0.8 10*3/uL (ref 0.1–0.9)
MONO%: 11.4 % (ref 0.0–14.0)
NEUT#: 4.7 10*3/uL (ref 1.5–6.5)
NEUT%: 71.1 % (ref 38.4–76.8)
Platelets: 290 10*3/uL (ref 145–400)
RBC: 5.2 10*6/uL (ref 3.70–5.45)
RDW: 14.1 % (ref 11.2–14.5)
WBC: 6.6 10*3/uL (ref 3.9–10.3)
nRBC: 0 % (ref 0–0)

## 2014-05-19 LAB — COMPREHENSIVE METABOLIC PANEL (CC13)
ALT: 28 U/L (ref 0–55)
AST: 26 U/L (ref 5–34)
Albumin: 3.4 g/dL — ABNORMAL LOW (ref 3.5–5.0)
Alkaline Phosphatase: 204 U/L — ABNORMAL HIGH (ref 40–150)
Anion Gap: 11 mEq/L (ref 3–11)
BUN: 19.2 mg/dL (ref 7.0–26.0)
CALCIUM: 10.3 mg/dL (ref 8.4–10.4)
CHLORIDE: 108 meq/L (ref 98–109)
CO2: 20 meq/L — AB (ref 22–29)
Creatinine: 1 mg/dL (ref 0.6–1.1)
GLUCOSE: 141 mg/dL — AB (ref 70–140)
Potassium: 4 mEq/L (ref 3.5–5.1)
SODIUM: 140 meq/L (ref 136–145)
Total Bilirubin: 0.3 mg/dL (ref 0.20–1.20)
Total Protein: 7.6 g/dL (ref 6.4–8.3)

## 2014-05-19 LAB — PHOSPHORUS: Phosphorus: 3.3 mg/dL (ref 2.3–4.6)

## 2014-05-19 LAB — CK: CK TOTAL: 71 U/L (ref 7–177)

## 2014-05-19 LAB — BILIRUBIN, DIRECT

## 2014-05-19 LAB — URIC ACID (CC13): URIC ACID, SERUM: 3.9 mg/dL (ref 2.6–7.4)

## 2014-05-19 MED ORDER — ONDANSETRON HCL 8 MG PO TABS: 8.0000 mg | ORAL_TABLET | Freq: Two times a day (BID) | ORAL | Status: DC | PRN

## 2014-05-19 MED ORDER — PROCHLORPERAZINE MALEATE 10 MG PO TABS: 10.0000 mg | ORAL_TABLET | Freq: Four times a day (QID) | ORAL | Status: DC | PRN

## 2014-05-19 MED ORDER — LORAZEPAM 0.5 MG PO TABS: 0.5000 mg | ORAL_TABLET | Freq: Four times a day (QID) | ORAL | Status: DC | PRN

## 2014-05-19 MED ORDER — INV-EVEROLIMUS (RAD0001) 5MG TABLET NOVARTIS CRAD001Y24135: 2.0000 | ORAL_TABLET | Freq: Every day | ORAL | Status: DC

## 2014-05-19 MED ORDER — PROCHLORPERAZINE 25 MG RE SUPP: 25.0000 mg | Freq: Two times a day (BID) | RECTAL | Status: DC | PRN

## 2014-05-19 MED ORDER — LETROZOLE 2.5 MG PO TABS: 2.5000 mg | ORAL_TABLET | Freq: Every day | ORAL | Status: DC

## 2014-05-19 NOTE — Progress Notes (Signed)
05/19/14 at 10:45am - [employer] - Bolero 4, Cycle 35, day 1 study notes - The pt was into the cancer center this am for her fasting labs and cycle 35 assessments. The pt confirmed that she was fasting upon arrival to the cancer center for her labs. The pt stated that she read over the new consent form that she was given on 05/17/14 by the research nurse.  The pt said that she had no questions or concerns about continuing her study participation.  The pt then signed the consent form.  The pt was given a copy of her signed consent form for her records.  The pt returned her cycle 34 everolimus study drug boxes. She confirmed that she took her everolimus (10 mg) every day along with letrozole (2.5 mg) from 04/21/14 - 05/18/14 (28 days). The pt's everolimus drug boxes had 0 remaining (unopened) blister packs. The pt was dispensed 56 pills - 56 doses taken (28 days x 2 doses) = 0 returned. Therefore, the pt is 100% compliant with her study drug, everolimus, for cycle 34. The pt's everolimus was taken to the pharmacy for the drug accountability check and storage. The pt was seen and examined today by Dr. Virgie Dad advanced practice provider, Susanne Borders. Susanne Borders, NP reviewed the pt's labs, and she felt that her abnormal lab values were "not clinically significant". The pt continues to watch her sugar intake. Her glucose was elevated at 141 today. It was decided to obtain another hemoglobin A1C at her next visit to monitor her hyperglycemia. The pt's vitals were stable, and her mean BP was obtained for study purposes.  The NP reviewed the pt's scans and she stated that her scans were "stable".  The pt's disease response is a "partial response" for study reporting purposes. The pt was in agreement to continue her current treatment plan.  The pt denies any new adverse events. She reports the following AE's as ongoing: hot flashes, memory impairment, intermittent nausea, arthritis in hands, fatigue, back pain, and  bilateral knee pain. The pt denies any new medications. She reports the following concomitant medications as ongoing: Lopressor, compazine prn, immodium prn, lopid, ibuprofen prn, and vitamin D3. She is performing all of her usual activities with some limitations. ECOG=1. The pt said that she is going home today and work in her yard all afternoon. The pt was dispensed her everolimus drug boxes (2 boxes now) for self administration for cycle 35. The pt's lipid panel is pending. The pt is aware of her November appointments.   05/20/14 at 11:00am - The pt's lipid panel is stable.

## 2014-05-19 NOTE — Progress Notes (Signed)
[facility]  [phone] [phone]) [phone]     ID: Barbara Parks DOB: Dec 06, 1950  MR#: [id number]  URK#:[id number]  Patient Care Team: Barbara Smolder, MD as PCP - General (Family Medicine) Candee Furbish, MD (Cardiology) Eston Esters, MD (Hematology and Oncology) Gaye Pollack, MD (Cardiothoracic Surgery)  OTHER MD: Dr Erroll Luna- Merlene Laughter, DDS  CHIEF COMPLAINT: metastatic breast cancer, on BOLERO study  CURRENT TREATMENT: letrozole + everolimus  BREAST CANCER HISTORY: From Dr. Collier Salina Parks's original intake note 04/13/2004:  "This woman has been in good health all of her life. She recently moved from [place] to work here.  She palpated a mass at about the 12 o'clock position in mid-July. She has not noticed any nipple retraction or skin changes.  She was seen by her primary care doctor who subsequently referred her for a mammogram.  Mammogram was performed on 03/01/04. This demonstrated a spiculated 2 cm mass at the 12 o'clock position in the right breast.  Upper outer quadrant of the left breast shows some distortion.   Physical exam at that time showed a firm, nontender nodule at the 12 o'clock position in the right breast, 5 cm from the nipple.  Ultrasound of this area showed a hypoechoic ill-defined mass, measuring 2.2 x 1.3 x 1.4 cm.  Physical exam of the left breast showed general vague thickening, upper outer quadrant of the left breast with a discrete palpable mass. The ultrasound performed showed a single hypoechoic ill-defined nodule at the 1 o'clock position, measuring 7 x 5 x 6 mm.  She had biopsies of both lesions on 03/02/03.  Needle core biopsy of the lesion on the right breast revealed invasive mammary carcinoma.  Needle core biopsy of the left breast showed a complex fibroadenoma.  Prognostic panel of the lesion on the right breast showed it to be ER positive at 73%, PR positive at 90% and proliferative index 9%, HER-2 was 1+.  Patient was  referred to Dr. Annamaria Boots, who performed a simple mastectomy with sentinel lymph node evaluation on 03/22/04.  Final pathology showed this to be an invasive ductal carcinoma  with lobular features, measuring 2.2 cm, grade 2 of 3.  Margins negative for carcinoma.  Invasive ductal carcinoma was extended to involve deep dermis of the nipple.  Lymphovascular invasion was identified.  Total of 4 sentinel lymph nodes were evaluated.  Touch imprints at the time of the OR was felt to be negative.  Subsequent evaluation showed a 5 mm focus of metastatic carcinoma in one of the four lymph nodes on microscopic after sectioning.  There was extracapsular extension  of one lymph node as well. The remaining three lymph nodes were all negative."  The patient's subsequent history is detailed below.  INTERVAL HISTORY: Barbara Parks returns to clinic today for follow up of her metastatic breast cancer, accompanied by our research nurse [name]. She continues on the [place] trial, currently on cycle 35. This consists of everolimus and letrozole. She continues to tolerate both drugs well. She is managing the hot flashes on her own and continues to have bilateral knee pain in addition to the left hip and lower back pains that seem to be worsening. Her bilateral hands also ache daily. She takes ibuprofen 41m PRN for these bony pains, but not every day. She denies mouth sores, rash, cough, or vaginal dryness.   REVIEW OF SYSTEMS: LKaaliyahdenies fevers, chills, or changes in bowel or bladder habits. She takes compazine PRN for intermittent nausea when  she feels it necessary, which is not often. She is short of breath with exertion, but denies chest pain, cough, or palpitations. She has had some memory impairment since the start of this study, per the patient. Often times she will walk into a room and forget why she was headed there a few times in a row. These symptoms are no worse than they were on the onsets. She has been watching her  diet and walking frequently and has seen nearly a 15lb weight loss since January of this year. A detailed review of systems is otherwise noncontributory.  PAST MEDICAL HISTORY: Past Medical History  Diagnosis Date  . Asthma   . Shortness of breath   . Arthritis     left hip  . Contact dermatitis     from a bandaid that had Latex   . Osteopenia due to cancer therapy 09/09/2013  . Hypercholesteremia   . ADHD (attention deficit hyperactivity disorder)   . breast ca 2005    breast/chemo R mastectomy  . Metastasis to lung dx'd 08/2011    PAST SURGICAL HISTORY: Past Surgical History  Procedure Laterality Date  . Spine surgery  1996  . Breast surgery  2005    right  . Video bronchoscopy  09/11/2011    Procedure: VIDEO BRONCHOSCOPY;  Surgeon: Gaye Pollack, MD;  Location: [facility];  Service: Thoracic;  Laterality: N/A;  . Chest tube insertion  09/11/2011    Procedure: INSERTION PLEURAL DRAINAGE CATHETER;  Surgeon: Gaye Pollack, MD;  Location: [facility];  Service: Thoracic;  Laterality: Right;  . Pericardial window  09/11/2011    Procedure: PERICARDIAL WINDOW;  Surgeon: Gaye Pollack, MD;  Location: [facility];  Service: Thoracic;  Laterality: N/A;  . Removal of pleural drainage catheter  12/19/2011    Procedure: REMOVAL OF PLEURAL DRAINAGE CATHETER;  Surgeon: Gaye Pollack, MD;  Location: [facility];  Service: Thoracic;  Laterality: Right;  TO BE DONE IN MINOR ROOM, SHORT STAY  . Cataract extraction w/phaco Left 01/18/2014    Procedure: CATARACT EXTRACTION PHACO AND INTRAOCULAR LENS PLACEMENT (IOC);  Surgeon: Elta Guadeloupe T. Gershon Crane, MD;  Location: [facility];  Service: Ophthalmology;  Laterality: Left;  CDE 15.79  . Cataract extraction w/phaco Right 02/08/2014    Procedure: CATARACT EXTRACTION PHACO AND INTRAOCULAR LENS PLACEMENT (IOC);  Surgeon: Elta Guadeloupe T. Gershon Crane, MD;  Location: [facility];  Service: Ophthalmology;  Laterality: Right;  CDE 4.41    FAMILY HISTORY Family History  Problem Relation Age of Onset  . Cancer  Mother     breast  . Heart disease Father   . Anesthesia problems Neg Hx    the patient's mother was diagnosed with breast cancer at age 43, she died age 61 from congestive heart failure..The patient's father died from heart disease at age 63.  She has one sister alive & well.  Two brothers alive & well, one with diabetes. The patient's sister was also diagnosed with breast cancer, and was tested for the BRCA gene, and was negative. The patient herself has not been tested. There is no history of ovarian cancer in the family  GYNECOLOGIC HISTORY:  No LMP recorded. Patient is postmenopausal. Menarche age 65, the patient is GX P0. She stopped having periods with chemotherapy in 2005. 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 She was a Archivist. She is single, lives alone  with her Shitzu-poodle Sammie.. Family is all in the [place] area.     ADVANCED DIRECTIVES: Not in place. At the 02/23/2014 visit the patient was given the appropriate documents to complete and notarize at her discretion. She tells me she is planning to name her sister, Billie Ruddy, as healthcare power of attorney. [name] can be reached at [phone]   HEALTH MAINTENANCE: History  Substance Use Topics  . Smoking status: Former Smoker -- 1.50 packs/day for 30 years    Types: Cigarettes    Quit date: 09/10/2007  . Smokeless tobacco: Not on file  . Alcohol Use: No     Colonoscopy:  PAP:  Bone density: 08/11/2013, showed osteopenia  Lipid panel:  Allergies  Allergen Reactions  . Aspirin     REACTION: upset stomach  . Latex Other (See Comments)    Blistering and skin peels off  . Nsaids Nausea And Vomiting    Extreme nausea and vomiting    Current Outpatient Prescriptions  Medication Sig Dispense Refill  . Bromfenac Sodium (PROLENSA) 0.07 % SOLN Apply 1 drop to eye every morning. Pt instills  1 drop to left eye daily.      . cholecalciferol (VITAMIN D) 1000 UNITS tablet Take 2,000 Units by mouth daily.       Marland Kitchen gemfibrozil (LOPID) 600 MG tablet TAKE ONE TABLET BY MOUTH TWICE DAILY BEFORE MEAL  90 tablet  0  . ibuprofen (ADVIL,MOTRIN) 200 MG tablet Take 400 mg by mouth every 6 (six) hours as needed. For pain      . Investigational everolimus (RAD001) 5 MG tablet Novartis [id number] Take 2 tablets by mouth daily. Take with a glass of water.  70 tablet  0  . letrozole (FEMARA) 2.5 MG tablet Take 1 tablet (2.5 mg total) by mouth daily.  90 tablet  4  . loperamide (IMODIUM) 2 MG capsule Take 2 mg by mouth 4 (four) times daily as needed for diarrhea or loose stools.      . metoprolol tartrate (LOPRESSOR) 25 MG tablet Take 0.5 tablets (12.5 mg total) by mouth daily.  30 tablet  4  . omeprazole (PRILOSEC) 40 MG capsule Take 1 capsule (40 mg total) by mouth daily.  90 capsule  4   No current facility-administered medications for this visit.    OBJECTIVE: Middle-aged white woman who appears stated age 57 Vitals:   05/19/14 0932  BP: 167/87  Temp:      Body mass index is 28.67 kg/(m^2).    ECOG FS:1 - Symptomatic but completely ambulatory  Skin: warm, dry  HEENT: sclerae anicteric, conjunctivae pink, oropharynx clear. No thrush or mucositis.  Lymph Nodes: No cervical or supraclavicular lymphadenopathy  Lungs: clear to auscultation bilaterally, no rales, wheezes, or rhonci  Heart: regular rate and rhythm  Abdomen: round, soft, non tender, positive bowel sounds  Musculoskeletal: No focal spinal tenderness, no peripheral edema  Neuro: non focal, well oriented, positive affect  Breasts: deferred  LAB RESULTS:  CMP     Component Value Date/Time   NA 141 04/21/2014 0815   NA 136 07/16/2012 0910   K 3.8 04/21/2014 0815   K 4.0 07/16/2012 0910   CL 109* 12/31/2012 0940   CL 103 07/16/2012 0910   CO2 20* 04/21/2014 0815   CO2 21 07/16/2012 0910   GLUCOSE 145* 04/21/2014 0815    GLUCOSE 127* 12/31/2012 0940   GLUCOSE 140* 07/16/2012 0910   BUN 19.6 04/21/2014 0815   BUN 16 07/16/2012 0910   CREATININE  1.0 04/21/2014 0815   CREATININE 0.75 07/16/2012 0910   CALCIUM 10.1 04/21/2014 0815   CALCIUM 10.1 07/16/2012 0910   PROT 7.9 04/21/2014 0815   PROT 8.0 07/16/2012 0910   ALBUMIN 3.4* 04/21/2014 0815   ALBUMIN 3.7 07/16/2012 0910   AST 28 04/21/2014 0815   AST 30 07/16/2012 0910   ALT 23 04/21/2014 0815   ALT 33 07/16/2012 0910   ALKPHOS 237* 04/21/2014 0815   ALKPHOS 142* 07/16/2012 0910   BILITOT 0.34 04/21/2014 0815   BILITOT 0.2* 07/16/2012 0910   GFRNONAA >90 09/13/2011 0440   GFRAA >90 09/13/2011 0440    I No results found for this basename: SPEP,  UPEP,   kappa and lambda light chains    Lab Results  Component Value Date   WBC 7.0 04/21/2014   NEUTROABS 5.0 04/21/2014   HGB 13.3 04/21/2014   HCT 40.1 04/21/2014   MCV 79.1* 04/21/2014   PLT 300 04/21/2014      Chemistry      Component Value Date/Time   NA 141 04/21/2014 0815   NA 136 07/16/2012 0910   K 3.8 04/21/2014 0815   K 4.0 07/16/2012 0910   CL 109* 12/31/2012 0940   CL 103 07/16/2012 0910   CO2 20* 04/21/2014 0815   CO2 21 07/16/2012 0910   BUN 19.6 04/21/2014 0815   BUN 16 07/16/2012 0910   CREATININE 1.0 04/21/2014 0815   CREATININE 0.75 07/16/2012 0910      Component Value Date/Time   CALCIUM 10.1 04/21/2014 0815   CALCIUM 10.1 07/16/2012 0910   ALKPHOS 237* 04/21/2014 0815   ALKPHOS 142* 07/16/2012 0910   AST 28 04/21/2014 0815   AST 30 07/16/2012 0910   ALT 23 04/21/2014 0815   ALT 33 07/16/2012 0910   BILITOT 0.34 04/21/2014 0815   BILITOT 0.2* 07/16/2012 0910       Lab Results  Component Value Date   LABCA2 58* 09/14/2012    No components found with this basename: [id number]    No results found for this basename: INR,  in the last 168 hours  Urinalysis    Component Value Date/Time   COLORURINE YELLOW 09/11/2011 [place] 09/11/2011 0819   LABSPEC 1.030  11/14/2011 1101   LABSPEC 1.013 09/11/2011 0819   PHURINE 6.5 09/11/2011 0819   GLUCOSEU NEGATIVE 09/11/2011 0819   HGBUR SMALL* 09/11/2011 [place] 09/11/2011 0819   KETONESUR 15* 09/11/2011 0819   PROTEINUR NEGATIVE 09/11/2011 0819   UROBILINOGEN 0.2 09/11/2011 0819   NITRITE NEGATIVE 09/11/2011 0819   LEUKOCYTESUR NEGATIVE 09/11/2011 0819    STUDIES: .Ct Chest W Contrast  03/22/2014   ADDENDUM REPORT: 03/22/2014 14:29  ADDENDUM: Target lesions:  1. Right lower lobe pulmonary nodule measures 6 mm on image number 41. It measures 6 mm on the prior study.  2. Partially calcified subcarinal lymph node measures 6 mm on image number 28. This is unchanged.  3. Right hilar lymph node on image number 27 measures 5 mm. It previously measured 4 mm.   Electronically Signed   By: Kalman Jewels M.D.   On: 03/22/2014 14:29   03/22/2014   CLINICAL DATA:  Breast cancer.  EXAM: CT CHEST, ABDOMEN, AND PELVIS WITH CONTRAST  TECHNIQUE: Multidetector CT imaging of the chest, abdomen and pelvis was performed following the standard protocol during bolus administration of intravenous contrast.  CONTRAST:  176m OMNIPAQUE IOHEXOL 300 MG/ML  SOLN  COMPARISON:  01/25/2014  FINDINGS: Recist  1.0  Target lesions:  1. Right lower lobe pulmonary nodule measures 6.5 mm on image number 41. It measured 6.3 mm on the prior study. 2. Partially calcified subcarinal lymph node measures 6.5 mm on image number 28. This is unchanged. 3. Right hilar lymph node on image number 27 measures 5 mm. It previously measured 4.5 mm. Non target lesions:  Non measurable.  CT CHEST FINDINGS  Stable surgical changes from a right mastectomy. No chest wall masses, supraclavicular or axillary lymphadenopathy. The left breasts is grossly normal. A small nodule in the mid breast is unchanged. The bony thorax is intact. No destructive bone lesions or spinal canal compromise. Stable degenerative changes involving the spine.  The heart is normal and  stable in size. No pericardial effusion. No mediastinal or hilar mass or adenopathy. Small scattered lymph nodes are unchanged. The esophagus is grossly normal. The aorta is normal in caliber. No dissection.  Examination of the lung parenchyma demonstrates a stable 6.5 mm nodule in the right lower lobe. No new or worrisome pulmonary lesions. No acute pulmonary findings. No pleural effusion.  CT ABDOMEN AND PELVIS FINDINGS  Diffuse fatty infiltration of the liver but no focal hepatic lesions or intrahepatic biliary dilatation. Stable areas of focal fatty sparing. The gallbladder is stable. There is a small nodule along the anterior wall. This is most likely a polyp or focal adenomyoma. No common bile duct dilatation. The pancreas is normal and stable. The spleen is normal in size. No focal lesions. There is a stable left adrenal gland nodule. The right adrenal gland is normal. Both kidneys are unremarkable.  The stomach, duodenum, small bowel and colon are unremarkable. No inflammatory changes, mass lesions or obstructive findings. No mesenteric or retroperitoneal mass or adenopathy. The aorta and branch vessels are patent. The major venous structures are patent.  The uterus and ovaries are normal. The bladder is normal. No pelvic mass, adenopathy or free pelvic fluid collections. No inguinal mass or adenopathy.  Examination the bony structures demonstrates advanced degenerative changes involving the left hip. No acute or worrisome bone findings. Stable faintly sclerotic lesion involving the left sacrum.  IMPRESSION: 1. Stable CT appearance of the chest, abdomen and pelvis. No findings for recurrent or metastatic disease. 2. Stable sclerotic lesion in the left sacrum. 3. Stable heterogeneous hepatic steatosis.  Electronically Signed: By: Kalman Jewels M.D. On: 03/22/2014 12:10   Mr Jeri Cos TD Contrast  04/02/2014   CLINICAL DATA:  History of breast cancer. Follow-up. No new symptoms.  EXAM: MRI HEAD WITHOUT AND  WITH CONTRAST  TECHNIQUE: Multiplanar, multiecho pulse sequences of the brain and surrounding structures were obtained without and with intravenous contrast.  CONTRAST:  42m MULTIHANCE GADOBENATE DIMEGLUMINE 529 MG/ML IV SOLN  COMPARISON:  08/21/2012 and 10/03/2011 brain MR.  FINDINGS: No acute infarct.  No intracranial hemorrhage.  No intracranial mass or bony destructive lesion to suggest presence of intracranial metastatic disease.  Developmental venous anomaly left frontal lobe incidentally noted and unchanged.  Minimal change in scattered punctate and patchy white matter type changes which most likely are related to result of small vessel disease. Other causes of white matter type changes felt to be secondary less likely considerations.  Mild cerebellar tonsil ectopia unchanged.  No hydrocephalus.  Major intracranial vascular structures are patent.  Post lens replacement otherwise orbital structures unremarkable.  IMPRESSION: No evidence of intracranial metastatic disease.  Left frontal developmental venous anomaly incidentally noted and unchanged.  Slight progression of small vessel disease type  changes.  Mild cerebellar tonsil ectopia stable in appearance.   Electronically Signed   By: Chauncey Cruel M.D.   On: 04/02/2014 10:02   Mr Lumbar Spine W Wo Contrast  04/02/2014   CLINICAL DATA:  Left leg pain. History of disc protrusion. History of breast cancer. History of back surgery.  EXAM: MRI LUMBAR SPINE WITHOUT AND WITH CONTRAST  TECHNIQUE: Multiplanar and multiecho pulse sequences of the lumbar spine were obtained without and with intravenous contrast.  CONTRAST:  16 cc MultiHance  COMPARISON:  CT scan of 03/22/2014  FINDINGS: The lowest lumbar type non-rib-bearing vertebra is labeled as L5. The conus medullaris appears normal. Conus level: Upper L2 level.  Enhancing T2 hyperintense left-sided lesion at the S2 level, 2.0 cm, favors bony metastatic lesion.  Type 2 degenerative endplate findings noted at  L3-4 with associated loss of intervertebral disc height.  No vertebral subluxation is observed.  Additional findings at individual levels are as follows:  L1-2:  Unremarkable.  L2-3: No impingement. Mild disc bulge. Shallow left lateral recess disc protrusion.  L3-4: No impingement. Posterior decompression. Disc bulge with intervertebral and facet spurring.  L4-5: Borderline left subarticular lateral recess stenosis due to disc bulge and facet arthropathy. Borderline right foraminal stenosis due to same.  L5-S1: Mild bilateral subarticular lateral recess stenosis along with mild right and borderline left foraminal stenosis due to facet arthropathy, intervertebral spurring, and right inferior foraminal and lateral recess disc protrusion.  IMPRESSION: 1. Lumbar spondylosis and degenerative disc disease, causing mild impingement at L5-S1 and borderline impingement at L4-5, as noted above. 2. Stable appearance of the 2 cm metastatic lesion in the left side of the sacrum at the S2 level.   Electronically Signed   By: Sherryl Barters M.D.   On: 04/02/2014 10:18   Ct Abdomen Pelvis W Contrast  03/22/2014   ADDENDUM REPORT: 03/22/2014 14:29  ADDENDUM: Target lesions:  1. Right lower lobe pulmonary nodule measures 6 mm on image number 41. It measures 6 mm on the prior study.  2. Partially calcified subcarinal lymph node measures 6 mm on image number 28. This is unchanged.  3. Right hilar lymph node on image number 27 measures 5 mm. It previously measured 4 mm.   Electronically Signed   By: Kalman Jewels M.D.   On: 03/22/2014 14:29   03/22/2014   CLINICAL DATA:  Breast cancer.  EXAM: CT CHEST, ABDOMEN, AND PELVIS WITH CONTRAST  TECHNIQUE: Multidetector CT imaging of the chest, abdomen and pelvis was performed following the standard protocol during bolus administration of intravenous contrast.  CONTRAST:  117m OMNIPAQUE IOHEXOL 300 MG/ML  SOLN  COMPARISON:  01/25/2014  FINDINGS: Recist 1.0  Target lesions:  1. Right  lower lobe pulmonary nodule measures 6.5 mm on image number 41. It measured 6.3 mm on the prior study. 2. Partially calcified subcarinal lymph node measures 6.5 mm on image number 28. This is unchanged. 3. Right hilar lymph node on image number 27 measures 5 mm. It previously measured 4.5 mm. Non target lesions:  Non measurable.  CT CHEST FINDINGS  Stable surgical changes from a right mastectomy. No chest wall masses, supraclavicular or axillary lymphadenopathy. The left breasts is grossly normal. A small nodule in the mid breast is unchanged. The bony thorax is intact. No destructive bone lesions or spinal canal compromise. Stable degenerative changes involving the spine.  The heart is normal and stable in size. No pericardial effusion. No mediastinal or hilar mass or adenopathy. Small scattered lymph  nodes are unchanged. The esophagus is grossly normal. The aorta is normal in caliber. No dissection.  Examination of the lung parenchyma demonstrates a stable 6.5 mm nodule in the right lower lobe. No new or worrisome pulmonary lesions. No acute pulmonary findings. No pleural effusion.  CT ABDOMEN AND PELVIS FINDINGS  Diffuse fatty infiltration of the liver but no focal hepatic lesions or intrahepatic biliary dilatation. Stable areas of focal fatty sparing. The gallbladder is stable. There is a small nodule along the anterior wall. This is most likely a polyp or focal adenomyoma. No common bile duct dilatation. The pancreas is normal and stable. The spleen is normal in size. No focal lesions. There is a stable left adrenal gland nodule. The right adrenal gland is normal. Both kidneys are unremarkable.  The stomach, duodenum, small bowel and colon are unremarkable. No inflammatory changes, mass lesions or obstructive findings. No mesenteric or retroperitoneal mass or adenopathy. The aorta and branch vessels are patent. The major venous structures are patent.  The uterus and ovaries are normal. The bladder is normal. No  pelvic mass, adenopathy or free pelvic fluid collections. No inguinal mass or adenopathy.  Examination the bony structures demonstrates advanced degenerative changes involving the left hip. No acute or worrisome bone findings. Stable faintly sclerotic lesion involving the left sacrum.  IMPRESSION: 1. Stable CT appearance of the chest, abdomen and pelvis. No findings for recurrent or metastatic disease. 2. Stable sclerotic lesion in the left sacrum. 3. Stable heterogeneous hepatic steatosis.  Electronically Signed: By: Kalman Jewels M.D. On: 03/22/2014 12:10     ASSESSMENT: 63 y.o. [place], [place] woman with stage IV breast cancer, on BOLERO-4 trial  (1) status post right mastectomy and sentinel lymph node sampling 03/22/2004 for a pT2 pN1, stage IIB invasive ductal carcinoma with lobular features, grade 2, estrogen receptor and progesterone receptor positive, HER-2 negative, with an MIB-1 of 9% ([id number] and [id number])  (2) addition all right axillary lymph node sampling 05/07/2004 showed 2 benign lymph nodes (4 lymph nodes previously removed, so total was one positive lymph node out of 6; [id number])  (3) the patient was evaluated by radiation oncology; no postmastectomy radiation recommended  (4) adjuvant chemotherapy with dose dense doxorubicin and cyclophosphamide x4 cycles (first cycle delayed one week) followed by dose dense paclitaxel x4 was completed 09/18/2004  (5) tamoxifen started March 2006, discontinued 2009  METASTATIC DISEASE: (6) presenting with a large pericardial effusion, large right pleural effusion and possible right middle lobe bronchial obstruction February 2013, status post pericardial window placement, fiberoptic bronchoscopy and right Pleurx placement 09/11/2011,, with biopsy of the bronchus intermedius and pericardial positive for metastatic breast cancer, estrogen receptor 91% positive with moderate staining intensity, progesterone receptor 100% positive with strong staining  intensity, with an MIB-1 of 35%, and no HER-2 amplification, the signals ratio being 1.37 ([id number])  (7) enrolled in BOLERO-4 trial 10/10/2011, receiving letrozole and everolimus  (a) two small areas of enhancement in the cerebellum noted by brain MRI 10/03/2011 were no longer apparent on repeat MRI 08/21/2012  (b) sclerotic lesions in left iliac bone and sacrum have not been biopsied; stable; plan is to start zolendronate after patient updates her dental care (extraction planned)  (c) RLL lung nodule, R hilar and subcarinal lymph nodes: stable  (9) additional problems:  (a) hepatic steatosis  (b) COPD/ emphysema  (c) advanced L hip osteoarthritis  (d) aortoiliac atherosclerosis  (e) dental evaluation pending w possible dental extractions  (f) possible thalassemia trait   PLAN:  Ladeidra continues to do well. The CBC, CMET, and Uric Acid labs were reviewed in detail and were completely stable. The other labs were not yet available to view. I am having a hemoglobin A1c added on to the vials that were drawn today. On 02/23/14 her A1c was 8.1. The CT scans of her chest, abdomen, and pelvis were stable and showed no new progression of disease. She will continue with cycle 35 of the BOLERO-4 trial as ordered. Betty's next scans will be performed on 07/12/14.  Deyja will return next month for labs and an office visit and the start of cycle 36. She understands and agrees with this plan. She knows the goal of treatment in her case is control. She has been encouraged to call with any issues that might arise before her next visit here.   Marcelino Duster, NP   05/19/2014 9:33 AM

## 2014-05-20 ENCOUNTER — Other Ambulatory Visit: Payer: Self-pay | Admitting: *Deleted

## 2014-05-20 LAB — LIPID PANEL
Cholesterol: 219 mg/dL — ABNORMAL HIGH (ref 0–200)
HDL: 48 mg/dL (ref >39)
LDL Cholesterol: 125 mg/dL — ABNORMAL HIGH (ref 0–99)
Total CHOL/HDL Ratio: 4.6 Ratio
Triglycerides: 229 mg/dL — ABNORMAL HIGH (ref <150)
VLDL: 46 mg/dL — ABNORMAL HIGH (ref 0–40)

## 2014-05-20 LAB — HEMOGLOBIN A1C
Hgb A1c MFr Bld: 7.6 % — ABNORMAL HIGH (ref <5.7)
MEAN PLASMA GLUCOSE: 171 mg/dL — AB (ref <117)

## 2014-06-13 ENCOUNTER — Other Ambulatory Visit: Payer: Self-pay | Admitting: Oncology

## 2014-06-16 ENCOUNTER — Other Ambulatory Visit (HOSPITAL_BASED_OUTPATIENT_CLINIC_OR_DEPARTMENT_OTHER): Payer: Medicare Other

## 2014-06-16 ENCOUNTER — Ambulatory Visit (HOSPITAL_BASED_OUTPATIENT_CLINIC_OR_DEPARTMENT_OTHER): Payer: Medicare Other | Admitting: Nurse Practitioner

## 2014-06-16 ENCOUNTER — Telehealth: Payer: Self-pay | Admitting: Oncology

## 2014-06-16 ENCOUNTER — Encounter: Payer: Self-pay | Admitting: *Deleted

## 2014-06-16 ENCOUNTER — Other Ambulatory Visit: Payer: Self-pay | Admitting: Oncology

## 2014-06-16 ENCOUNTER — Other Ambulatory Visit: Payer: Self-pay | Admitting: *Deleted

## 2014-06-16 ENCOUNTER — Encounter: Payer: Self-pay | Admitting: Nurse Practitioner

## 2014-06-16 VITALS — BP 126/65 | HR 87 | Temp 97.7°F | Resp 18 | Ht 65.0 in | Wt 169.8 lb

## 2014-06-16 DIAGNOSIS — J449 Chronic obstructive pulmonary disease, unspecified: Secondary | ICD-10-CM | POA: Diagnosis not present

## 2014-06-16 DIAGNOSIS — C7800 Secondary malignant neoplasm of unspecified lung: Principal | ICD-10-CM

## 2014-06-16 DIAGNOSIS — C7951 Secondary malignant neoplasm of bone: Secondary | ICD-10-CM | POA: Diagnosis not present

## 2014-06-16 DIAGNOSIS — C50919 Malignant neoplasm of unspecified site of unspecified female breast: Secondary | ICD-10-CM

## 2014-06-16 DIAGNOSIS — C50412 Malignant neoplasm of upper-outer quadrant of left female breast: Secondary | ICD-10-CM

## 2014-06-16 DIAGNOSIS — R809 Proteinuria, unspecified: Secondary | ICD-10-CM

## 2014-06-16 DIAGNOSIS — N200 Calculus of kidney: Secondary | ICD-10-CM

## 2014-06-16 DIAGNOSIS — C50911 Malignant neoplasm of unspecified site of right female breast: Secondary | ICD-10-CM

## 2014-06-16 DIAGNOSIS — K760 Fatty (change of) liver, not elsewhere classified: Secondary | ICD-10-CM | POA: Diagnosis not present

## 2014-06-16 DIAGNOSIS — R319 Hematuria, unspecified: Secondary | ICD-10-CM

## 2014-06-16 DIAGNOSIS — N269 Renal sclerosis, unspecified: Secondary | ICD-10-CM

## 2014-06-16 DIAGNOSIS — C7931 Secondary malignant neoplasm of brain: Secondary | ICD-10-CM | POA: Diagnosis not present

## 2014-06-16 DIAGNOSIS — R300 Dysuria: Secondary | ICD-10-CM

## 2014-06-16 DIAGNOSIS — I700 Atherosclerosis of aorta: Secondary | ICD-10-CM | POA: Diagnosis not present

## 2014-06-16 LAB — PHOSPHORUS: Phosphorus: 3.9 mg/dL (ref 2.3–4.6)

## 2014-06-16 LAB — CBC WITH DIFFERENTIAL/PLATELET
BASO%: 0.5 % (ref 0.0–2.0)
BASOS ABS: 0 10*3/uL (ref 0.0–0.1)
EOS ABS: 0.1 10*3/uL (ref 0.0–0.5)
EOS%: 2.1 % (ref 0.0–7.0)
HCT: 40.2 % (ref 34.8–46.6)
HEMOGLOBIN: 13.2 g/dL (ref 11.6–15.9)
LYMPH#: 1.1 10*3/uL (ref 0.9–3.3)
LYMPH%: 17.6 % (ref 14.0–49.7)
MCH: 26 pg (ref 25.1–34.0)
MCHC: 32.8 g/dL (ref 31.5–36.0)
MCV: 79.1 fL — ABNORMAL LOW (ref 79.5–101.0)
MONO#: 0.7 10*3/uL (ref 0.1–0.9)
MONO%: 10.7 % (ref 0.0–14.0)
NEUT#: 4.2 10*3/uL (ref 1.5–6.5)
NEUT%: 69.1 % (ref 38.4–76.8)
PLATELETS: 229 10*3/uL (ref 145–400)
RBC: 5.08 10*6/uL (ref 3.70–5.45)
RDW: 13.8 % (ref 11.2–14.5)
WBC: 6.1 10*3/uL (ref 3.9–10.3)

## 2014-06-16 LAB — LIPID PANEL
CHOLESTEROL: 243 mg/dL — AB (ref 0–200)
HDL: 50 mg/dL (ref >39)
LDL Cholesterol: 139 mg/dL — ABNORMAL HIGH (ref 0–99)
Total CHOL/HDL Ratio: 4.9 Ratio
Triglycerides: 272 mg/dL — ABNORMAL HIGH (ref <150)
VLDL: 54 mg/dL — AB (ref 0–40)

## 2014-06-16 LAB — URINALYSIS, MICROSCOPIC - CHCC
BILIRUBIN (URINE): NEGATIVE
GLUCOSE UR CHCC: NEGATIVE mg/dL
Ketones: NEGATIVE mg/dL
LEUKOCYTE ESTERASE: NEGATIVE
NITRITE: NEGATIVE
Protein: 30 mg/dL
Specific Gravity, Urine: 1.03 (ref 1.003–1.035)
Urobilinogen, UR: 0.2 mg/dL (ref 0.2–1)
pH: 6 (ref 4.6–8.0)

## 2014-06-16 LAB — COMPREHENSIVE METABOLIC PANEL (CC13)
ALT: 23 U/L (ref 0–55)
ANION GAP: 12 meq/L — AB (ref 3–11)
AST: 26 U/L (ref 5–34)
Albumin: 3.6 g/dL (ref 3.5–5.0)
Alkaline Phosphatase: 226 U/L — ABNORMAL HIGH (ref 40–150)
BUN: 25.9 mg/dL (ref 7.0–26.0)
CALCIUM: 10.5 mg/dL — AB (ref 8.4–10.4)
CHLORIDE: 108 meq/L (ref 98–109)
CO2: 20 meq/L — AB (ref 22–29)
Creatinine: 0.9 mg/dL (ref 0.6–1.1)
GLUCOSE: 135 mg/dL (ref 70–140)
Potassium: 4.1 mEq/L (ref 3.5–5.1)
Sodium: 139 mEq/L (ref 136–145)
Total Bilirubin: 0.32 mg/dL (ref 0.20–1.20)
Total Protein: 7.8 g/dL (ref 6.4–8.3)

## 2014-06-16 LAB — URIC ACID (CC13): Uric Acid, Serum: 4 mg/dl (ref 2.6–7.4)

## 2014-06-16 LAB — BILIRUBIN, DIRECT

## 2014-06-16 LAB — CK: CK TOTAL: 58 U/L (ref 7–177)

## 2014-06-16 MED ORDER — OMEPRAZOLE 40 MG PO CPDR: 40.0000 mg | DELAYED_RELEASE_CAPSULE | Freq: Every day | ORAL | Status: DC

## 2014-06-16 MED ORDER — INV-EVEROLIMUS (RAD0001) 5MG TABLET NOVARTIS CRAD001Y24135: 2.0000 | ORAL_TABLET | Freq: Every day | ORAL | Status: DC

## 2014-06-16 NOTE — Progress Notes (Signed)
06/16/14 at 10:20am - [employer] - Bolero 4, Cycle 36, day 1 study notes - The pt was into the cancer center this am for her fasting labs and cycle 36 assessments. The pt confirmed that she was fasting upon arrival to the cancer center for her labs. The pt returned her cycle 35 everolimus study drug boxes. She confirmed that she took her everolimus (10 mg) every day along with letrozole (2.5 mg) from 05/19/14 - 06/15/14 (28 days). The pt's everolimus drug boxes had 0 remaining (unopened) blister packs. The pt was dispensed 56 pills - 56 doses taken (28 days x 2 doses) = 0 returned. Therefore, the pt is 100% compliant with her study drug, everolimus, for cycle 35. The pt's everolimus was taken to the pharmacy for the drug accountability check and storage. The pt was seen and examined today by Dr. Virgie Dad advanced practice provider, Susanne Borders. Susanne Borders, NP reviewed the pt's labs, and she felt that her abnormal lab values were "not clinically significant". The pt continues to watch her sugar intake. Her glucose was normal at 135 today. The pt's hemoglobin A1C was down to 7.6 from 8.1.  The pt was encouraged to continue to lose weight and make dietary modifications.  The pt's vitals were stable, and her mean BP was obtained for study purposes. The pt reports some increased urinary symptoms (dysuria, urgency, etc)- grade 2 that started 3 days ago on 06/13/14 . She reports the following AE's as ongoing: hot flashes, memory impairment, intermittent nausea, arthritis in hands, fatigue, back pain, and bilateral knee pain. The pt denies any new medications. She reports the following concomitant medications as ongoing: Lopressor, compazine prn, immodium prn, lopid, ibuprofen prn, and vitamin D3. She is performing all of her usual activities with some limitations. ECOG=1. The pt was dispensed her everolimus drug boxes (2 boxes now) for self administration for cycle 36. The pt's lipid panel is pending. The pt is  aware of her December appointments.   06/17/14 at 12pm - The pt's lipid panel was stable.  The research nurse discussed with the pt about her upcoming CT's next week to rule out any kidney stones.  The pt verbalized understanding.

## 2014-06-16 NOTE — Telephone Encounter (Signed)
lvm for pt regarding to [facility] adn Jan appts....mailed pt appt sched.avs adn letter

## 2014-06-16 NOTE — Progress Notes (Signed)
[facility]  Telephone:(336) [phone] Fax:(336) [phone]     ID: Barbara Parks DOB: 09/20/1950  MR#: [id number]  MHD#:[id number]  Patient Care Team: Marjorie Smolder, MD as PCP - General (Family Medicine) Candee Furbish, MD (Cardiology) Eston Esters, MD (Hematology and Oncology) Gaye Pollack, MD (Cardiothoracic Surgery)  OTHER MD: Dr Erroll Luna- Merlene Laughter, DDS  CHIEF COMPLAINT: metastatic breast cancer, on BOLERO study  CURRENT TREATMENT: letrozole + everolimus  BREAST CANCER HISTORY: From Dr. Collier Salina Parks's original intake note 04/13/2004:  "This woman has been in good health all of her life. She recently moved from [place] to work here.  She palpated a mass at about the 12 o'clock position in mid-July. She has not noticed any nipple retraction or skin changes.  She was seen by her primary care doctor who subsequently referred her for a mammogram.  Mammogram was performed on 03/01/04. This demonstrated a spiculated 2 cm mass at the 12 o'clock position in the right breast.  Upper outer quadrant of the left breast shows some distortion.   Physical exam at that time showed a firm, nontender nodule at the 12 o'clock position in the right breast, 5 cm from the nipple.  Ultrasound of this area showed a hypoechoic ill-defined mass, measuring 2.2 x 1.3 x 1.4 cm.  Physical exam of the left breast showed general vague thickening, upper outer quadrant of the left breast with a discrete palpable mass. The ultrasound performed showed a single hypoechoic ill-defined nodule at the 1 o'clock position, measuring 7 x 5 x 6 mm.  She had biopsies of both lesions on 03/02/03.  Needle core biopsy of the lesion on the right breast revealed invasive mammary carcinoma.  Needle core biopsy of the left breast showed a complex fibroadenoma.  Prognostic panel of the lesion on the right breast showed it to be ER positive at 73%, PR positive at 90% and proliferative index 9%, HER-2 was 1+.  Patient was  referred to Dr. Annamaria Parks, who performed a simple mastectomy with sentinel lymph node evaluation on 03/22/04.  Final pathology showed this to be an invasive ductal carcinoma  with lobular features, measuring 2.2 cm, grade 2 of 3.  Margins negative for carcinoma.  Invasive ductal carcinoma was extended to involve deep dermis of the nipple.  Lymphovascular invasion was identified.  Total of 4 sentinel lymph nodes were evaluated.  Touch imprints at the time of the OR was felt to be negative.  Subsequent evaluation showed a 5 mm focus of metastatic carcinoma in one of the four lymph nodes on microscopic after sectioning.  There was extracapsular extension  of one lymph node as well. The remaining three lymph nodes were all negative."  The patient's subsequent history is detailed below.  INTERVAL HISTORY: Barbara Parks returns to clinic today for follow up of her metastatic breast cancer, accompanied by our research nurse [name]. She continues on the [place] trial, currently on cycle 36. This consists of everolimus and letrozole. She continues to tolerate both drugs well. She has regular hot flashes, but she manages these on her own. She has chronic knee, left hip, and lower back pain that worsens at times. She also has bilateral hand cramps and aches daily. She takes ibuprofen 456m PRN, but tries not to use this drug often because of gastric irritation. She takes tums frequently to reduce the stomach acid. Otherwise she denies mouth sores, rashes, cough, or vaginal changes. She continues to watch her diet and has lost 15lbs. Her only  new complaint today is dysuria, frequency, and spasm like pain to her left groin x 3 days.   REVIEW OF SYSTEMS: Barbara Parks denies fevers, chills, or changes in bowel habits. She takes compazine PRN for intermittent nausea when she feels it necessary, which is not often. She is short of breath with exertion, but denies chest pain, cough, or palpitations. She has had some memory impairment  since the start of this study, per the patient. Often times she will walk into a room and forget why she was headed there a few times in a row. These symptoms are no worse than they were at the onset. A detailed review of systems is otherwise noncontributory.  PAST MEDICAL HISTORY: Past Medical History  Diagnosis Date  . Asthma   . Shortness of breath   . Arthritis     left hip  . Contact dermatitis     from a bandaid that had Latex   . Osteopenia due to cancer therapy 09/09/2013  . Hypercholesteremia   . ADHD (attention deficit hyperactivity disorder)   . breast ca 2005    breast/chemo R mastectomy  . Metastasis to lung dx'd 08/2011    PAST SURGICAL HISTORY: Past Surgical History  Procedure Laterality Date  . Spine surgery  1996  . Breast surgery  2005    right  . Video bronchoscopy  09/11/2011    Procedure: VIDEO BRONCHOSCOPY;  Surgeon: Gaye Pollack, MD;  Location: [facility];  Service: Thoracic;  Laterality: N/A;  . Chest tube insertion  09/11/2011    Procedure: INSERTION PLEURAL DRAINAGE CATHETER;  Surgeon: Gaye Pollack, MD;  Location: [facility];  Service: Thoracic;  Laterality: Right;  . Pericardial window  09/11/2011    Procedure: PERICARDIAL WINDOW;  Surgeon: Gaye Pollack, MD;  Location: [facility];  Service: Thoracic;  Laterality: N/A;  . Removal of pleural drainage catheter  12/19/2011    Procedure: REMOVAL OF PLEURAL DRAINAGE CATHETER;  Surgeon: Gaye Pollack, MD;  Location: [facility];  Service: Thoracic;  Laterality: Right;  TO BE DONE IN MINOR ROOM, SHORT STAY  . Cataract extraction w/phaco Left 01/18/2014    Procedure: CATARACT EXTRACTION PHACO AND INTRAOCULAR LENS PLACEMENT (IOC);  Surgeon: Elta Guadeloupe T. Gershon Crane, MD;  Location: [facility];  Service: Ophthalmology;  Laterality: Left;  CDE 15.79  . Cataract extraction w/phaco Right 02/08/2014    Procedure: CATARACT EXTRACTION PHACO AND INTRAOCULAR LENS PLACEMENT (IOC);  Surgeon: Elta Guadeloupe T. Gershon Crane, MD;  Location: [facility];  Service: Ophthalmology;   Laterality: Right;  CDE 4.41    FAMILY HISTORY Family History  Problem Relation Age of Onset  . Cancer Mother     breast  . Heart disease Father   . Anesthesia problems Neg Hx    the patient's mother was diagnosed with breast cancer at age 60, she died age 61 from congestive heart failure..The patient's father died from heart disease at age 58.  She has one sister alive & well.  Two brothers alive & well, one with diabetes. The patient's sister was also diagnosed with breast cancer, and was tested for the BRCA gene, and was negative. The patient herself has not been tested. There is no history of ovarian cancer in the family  GYNECOLOGIC HISTORY:  No LMP recorded. Patient is postmenopausal. Menarche age 59, the patient is GX P0. She stopped having periods with chemotherapy in 2005. 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 She was a Archivist. She is single, lives alone with her Shitzu-poodle Barbara Parks.. Family is all in the [place] area.     ADVANCED DIRECTIVES: Not in place. At the 02/23/2014 visit the patient was given the appropriate documents to complete and notarize at her discretion. She tells me she is planning to name her sister, Billie Ruddy, as healthcare power of attorney. [name] can be reached at [phone]   HEALTH MAINTENANCE: History  Substance Use Topics  . Smoking status: Former Smoker -- 1.50 packs/day for 30 years    Types: Cigarettes    Quit date: 09/10/2007  . Smokeless tobacco: Not on file  . Alcohol Use: No     Colonoscopy:  PAP:  Bone density: 08/11/2013, showed osteopenia  Lipid panel:  Allergies  Allergen Reactions  . Aspirin     REACTION: upset stomach  . Latex Other (See Comments)    Blistering and skin peels off  . Nsaids Nausea And Vomiting    Extreme nausea and vomiting    Current Outpatient Prescriptions  Medication  Sig Dispense Refill  . cholecalciferol (VITAMIN D) 1000 UNITS tablet Take 2,000 Units by mouth daily.     Marland Kitchen gemfibrozil (LOPID) 600 MG tablet TAKE ONE TABLET BY MOUTH TWICE DAILY BEFORE A  MEAL 90 tablet 0  . ibuprofen (ADVIL,MOTRIN) 200 MG tablet Take 400 mg by mouth every 6 (six) hours as needed. For pain    . Investigational everolimus (RAD001) 5 MG tablet [employer] [id number] Take 2 tablets by mouth daily. Take with a glass of water. 70 tablet 0  . Investigational everolimus (RAD001) 5 MG tablet [employer] [id number] Take 2 tablets by mouth daily. Take with a glass of water. 70 tablet 0  . Investigational everolimus (RAD001) 5 MG tablet [employer] [id number] Take 2 tablets by mouth daily. Take with a glass of water. 70 tablet 0  . letrozole (FEMARA) 2.5 MG tablet Take 1 tablet (2.5 mg total) by mouth daily. 90 tablet 4  . letrozole (FEMARA) 2.5 MG tablet Take 1 tablet (2.5 mg total) by mouth daily. 30 tablet 0  . loperamide (IMODIUM) 2 MG capsule Take 2 mg by mouth 4 (four) times daily as needed for diarrhea or loose stools.    Marland Kitchen LORazepam (ATIVAN) 0.5 MG tablet Take 1 tablet (0.5 mg total) by mouth every 6 (six) hours as needed (Nausea or vomiting). 30 tablet 0  . metoprolol tartrate (LOPRESSOR) 25 MG tablet Take 0.5 tablets (12.5 mg total) by mouth daily. 30 tablet 4  . omeprazole (PRILOSEC) 40 MG capsule Take 1 capsule (40 mg total) by mouth daily. 90 capsule 4  . ondansetron (ZOFRAN) 8 MG tablet Take 1 tablet (8 mg total) by mouth 2 (two) times daily as needed (Nausea or vomiting). 30 tablet 1  . prochlorperazine (COMPAZINE) 10 MG tablet Take 1 tablet (10 mg total) by mouth every 6 (six) hours as needed (Nausea or vomiting). 30 tablet 1  . prochlorperazine (COMPAZINE) 25 MG suppository Place 1 suppository (25 mg total) rectally every 12 (twelve) hours as needed for nausea. 12 suppository 3   No current facility-administered medications for this visit.    OBJECTIVE: Middle-aged white  woman who appears stated age 22 Vitals:   06/16/14 0922  BP: 126/65  Pulse:   Temp:   Resp:      Body mass index is 28.26 kg/(m^2).    ECOG FS:1 - Symptomatic but completely ambulatory   Sclerae unicteric, pupils equal  and reactive Oropharynx clear and moist-- no thrush No cervical or supraclavicular adenopathy Lungs no rales or rhonchi Heart regular rate and rhythm Abd soft, nontender, positive bowel sounds MSK no focal spinal tenderness, no upper extremity lymphedema Neuro: nonfocal, well oriented, appropriate affect Breasts: right breast status post mastectomy. No evidence of local recurrence. Right axilla benign. Left breast unremarkable.   LAB RESULTS:  CMP     Component Value Date/Time   NA 139 06/16/2014 0858   NA 136 07/16/2012 0910   K 4.1 06/16/2014 0858   K 4.0 07/16/2012 0910   CL 109* 12/31/2012 0940   CL 103 07/16/2012 0910   CO2 20* 06/16/2014 0858   CO2 21 07/16/2012 0910   GLUCOSE 135 06/16/2014 0858   GLUCOSE 127* 12/31/2012 0940   GLUCOSE 140* 07/16/2012 0910   BUN 25.9 06/16/2014 0858   BUN 16 07/16/2012 0910   CREATININE 0.9 06/16/2014 0858   CREATININE 0.75 07/16/2012 0910   CALCIUM 10.5* 06/16/2014 0858   CALCIUM 10.1 07/16/2012 0910   PROT 7.8 06/16/2014 0858   PROT 8.0 07/16/2012 0910   ALBUMIN 3.6 06/16/2014 0858   ALBUMIN 3.7 07/16/2012 0910   AST 26 06/16/2014 0858   AST 30 07/16/2012 0910   ALT 23 06/16/2014 0858   ALT 33 07/16/2012 0910   ALKPHOS 226* 06/16/2014 0858   ALKPHOS 142* 07/16/2012 0910   BILITOT 0.32 06/16/2014 0858   BILITOT 0.2* 07/16/2012 0910   GFRNONAA >90 09/13/2011 0440   GFRAA >90 09/13/2011 0440    I No results found for: SPEP  Lab Results  Component Value Date   WBC 6.1 06/16/2014   NEUTROABS 4.2 06/16/2014   HGB 13.2 06/16/2014   HCT 40.2 06/16/2014   MCV 79.1* 06/16/2014   PLT 229 06/16/2014      Chemistry      Component Value Date/Time   NA 139 06/16/2014 0858   NA 136 07/16/2012 0910     K 4.1 06/16/2014 0858   K 4.0 07/16/2012 0910   CL 109* 12/31/2012 0940   CL 103 07/16/2012 0910   CO2 20* 06/16/2014 0858   CO2 21 07/16/2012 0910   BUN 25.9 06/16/2014 0858   BUN 16 07/16/2012 0910   CREATININE 0.9 06/16/2014 0858   CREATININE 0.75 07/16/2012 0910      Component Value Date/Time   CALCIUM 10.5* 06/16/2014 0858   CALCIUM 10.1 07/16/2012 0910   ALKPHOS 226* 06/16/2014 0858   ALKPHOS 142* 07/16/2012 0910   AST 26 06/16/2014 0858   AST 30 07/16/2012 0910   ALT 23 06/16/2014 0858   ALT 33 07/16/2012 0910   BILITOT 0.32 06/16/2014 0858   BILITOT 0.2* 07/16/2012 0910       Lab Results  Component Value Date   LABCA2 58* 09/14/2012    No components found for: VAPOL410  No results for input(s): INR in the last 168 hours.  Urinalysis    Component Value Date/Time   COLORURINE YELLOW 09/11/2011 0819   APPEARANCEUR CLEAR 09/11/2011 0819   LABSPEC 1.030 11/14/2011 1101   LABSPEC 1.013 09/11/2011 0819   PHURINE 6.5 09/11/2011 0819   GLUCOSEU NEGATIVE 09/11/2011 0819   HGBUR SMALL* 09/11/2011 0819   BILIRUBINUR NEGATIVE 09/11/2011 0819   KETONESUR 15* 09/11/2011 0819   PROTEINUR NEGATIVE 09/11/2011 0819   UROBILINOGEN 0.2 09/11/2011 0819   NITRITE NEGATIVE 09/11/2011 0819   LEUKOCYTESUR NEGATIVE 09/11/2011 0819    STUDIES: Ct Chest W Contrast  05/17/2014   CLINICAL DATA:  Right  breast cancer with lung metastasis and brain metastasis. Oral chemotherapy ongoing. Subsequent treatment evaluation. RECIST protocol.  EXAM: CT CHEST, ABDOMEN, AND PELVIS WITH CONTRAST  TECHNIQUE: Multidetector CT imaging of the chest, abdomen and pelvis was performed following the standard protocol during bolus administration of intravenous contrast.  CONTRAST:  59m OMNIPAQUE IOHEXOL 300 MG/ML SOLN, 1068mOMNIPAQUE IOHEXOL 300 MG/ML SOLN  COMPARISON:  CT 03/22/2014  RECIST 1.1  Target Lesions:  1. Right lower lobe pulmonary nodule measures 6 mm (image 40, series 4) unchanged from 6  mm. 2. Partially calcified subcarinal lymph node measures 6 mm (image 28 series 2) unchanged from 6 mm. 3. Right hilar lymph node measures 8 mm (image 28, series 2) compared to 5 mm  Non-target Lesions  1. None  FINDINGS: CT CHEST FINDINGS  Right mastectomy anatomy. Right lymphadenectomy clips noted. No axillary or supraclavicular lymphadenopathy. No mediastinal or hilar lymphadenopathy. No pericardial fluid.  No new or suspicious pulmonary nodules.  CT ABDOMEN AND PELVIS FINDINGS  Hepatobiliary: Diffuse low-attenuation of the liver. No enhancing hepatic lesion. No biliary duct dilatation.  Pancreas: The discretion at Pancreas is normal. No duct dilatation. No pancreatic inflammation.  Spleen: Normal spleen.  Adrenals/Urinary Tract: Adrenal glands are normal. Kidneys are normal.  Stomach/Bowel: Stomach, small bowel, appendix, cecum normal. The colon and rectosigmoid colon are normal.  Vascular/Lymphatic: Insert clear retro  Reproductive: Uterus ovaries are normal.  No free fluid the pelvis.  Other: No omental or peritoneal disease.  Musculoskeletal: Stable sclerotic lesion in the left sacral ala on image 96. Degenerative changes of the hips are noted.  IMPRESSION: Chest Impression:  1. No evidence of breast cancer recurrence or metastasis within the thorax. 2. Post right mastectomy.  Abdomen / Pelvis Impression:  1. Stable exam of the abdomen and pelvis 2. Hepatic steatosis. 3. Stable sclerotic lesion in the sacrum   Electronically Signed   By: StSuzy Bouchard.D.   On: 05/17/2014 15:15   Ct Abdomen Pelvis W Contrast  05/17/2014   CLINICAL DATA:  Right breast cancer with lung metastasis and brain metastasis. Oral chemotherapy ongoing. Subsequent treatment evaluation. RECIST protocol.  EXAM: CT CHEST, ABDOMEN, AND PELVIS WITH CONTRAST  TECHNIQUE: Multidetector CT imaging of the chest, abdomen and pelvis was performed following the standard protocol during bolus administration of intravenous contrast.  CONTRAST:   5072mMNIPAQUE IOHEXOL 300 MG/ML SOLN, 100m1mNIPAQUE IOHEXOL 300 MG/ML SOLN  COMPARISON:  CT 03/22/2014  RECIST 1.1  Target Lesions:  1. Right lower lobe pulmonary nodule measures 6 mm (image 40, series 4) unchanged from 6 mm. 2. Partially calcified subcarinal lymph node measures 6 mm (image 28 series 2) unchanged from 6 mm. 3. Right hilar lymph node measures 8 mm (image 28, series 2) compared to 5 mm  Non-target Lesions  1. None  FINDINGS: CT CHEST FINDINGS  Right mastectomy anatomy. Right lymphadenectomy clips noted. No axillary or supraclavicular lymphadenopathy. No mediastinal or hilar lymphadenopathy. No pericardial fluid.  No new or suspicious pulmonary nodules.  CT ABDOMEN AND PELVIS FINDINGS  Hepatobiliary: Diffuse low-attenuation of the liver. No enhancing hepatic lesion. No biliary duct dilatation.  Pancreas: The discretion at Pancreas is normal. No duct dilatation. No pancreatic inflammation.  Spleen: Normal spleen.  Adrenals/Urinary Tract: Adrenal glands are normal. Kidneys are normal.  Stomach/Bowel: Stomach, small bowel, appendix, cecum normal. The colon and rectosigmoid colon are normal.  Vascular/Lymphatic: Insert clear retro  Reproductive: Uterus ovaries are normal.  No free fluid the pelvis.  Other: No omental or  peritoneal disease.  Musculoskeletal: Stable sclerotic lesion in the left sacral ala on image 96. Degenerative changes of the hips are noted.  IMPRESSION: Chest Impression:  1. No evidence of breast cancer recurrence or metastasis within the thorax. 2. Post right mastectomy.  Abdomen / Pelvis Impression:  1. Stable exam of the abdomen and pelvis 2. Hepatic steatosis. 3. Stable sclerotic lesion in the sacrum   Electronically Signed   By: Suzy Bouchard M.D.   On: 05/17/2014 15:15      ASSESSMENT: 63 y.o. Barbara Parks, Barbara Parks woman with stage IV breast cancer, on BOLERO-4 trial  (1) status post right mastectomy and sentinel lymph node sampling 03/22/2004 for a pT2 pN1, stage IIB invasive  ductal carcinoma with lobular features, grade 2, estrogen receptor and progesterone receptor positive, HER-2 negative, with an MIB-1 of 9% ([id number] and [id number])  (2) addition all right axillary lymph node sampling 05/07/2004 showed 2 benign lymph nodes (4 lymph nodes previously removed, so total was one positive lymph node out of 6; [id number])  (3) the patient was evaluated by radiation oncology; no postmastectomy radiation recommended  (4) adjuvant chemotherapy with dose dense doxorubicin and cyclophosphamide x4 cycles (first cycle delayed one week) followed by dose dense paclitaxel x4 was completed 09/18/2004  (5) tamoxifen started March 2006, discontinued 2009  METASTATIC DISEASE: (6) presenting with a large pericardial effusion, large right pleural effusion and possible right middle lobe bronchial obstruction February 2013, status post pericardial window placement, fiberoptic bronchoscopy and right Pleurx placement 09/11/2011,, with biopsy of the bronchus intermedius and pericardial positive for metastatic breast cancer, estrogen receptor 91% positive with moderate staining intensity, progesterone receptor 100% positive with strong staining intensity, with an MIB-1 of 35%, and no HER-2 amplification, the signals ratio being 1.37 ([id number])  (7) enrolled in BOLERO-4 trial 10/10/2011, receiving letrozole and everolimus  (a) two small areas of enhancement in the cerebellum noted by brain MRI 10/03/2011 were no longer apparent on repeat MRI 08/21/2012  (b) sclerotic lesions in left iliac bone and sacrum have not been biopsied; stable; plan is to start zolendronate after patient updates her dental care (extraction planned)  (c) RLL lung nodule, R hilar and subcarinal lymph nodes: stable  (9) additional problems:  (a) hepatic steatosis  (b) COPD/ emphysema  (c) advanced L hip osteoarthritis  (d) aortoiliac atherosclerosis  (e) dental evaluation pending w possible dental extractions  (f)  possible thalassemia trait   PLAN: Trinitee is doing well today. The labs were reviewed in detail and her calcium level is elevated. I asked her to discontinue any calcium supplements, including her use of TUMS for acid reflux/indigestion. I am starting her on omeprazole 59m daily which is cleared for use while on the BOLERO protocol per pharmacy. She will continue with cycle 36 as scheduled.   Ashlyn left a sample for a urinalysis. These results show proteinuria and hematuria as well as granular casts. This suggests an acute kidney injury and possible kidney stones. I am sending her for a CT of the abdomen/pelvis to check for stones and referring her to a urologist. She should follow up with her PCP regarding the proteinnuria and elevated A1c.   LBettyjaneis scheduled for her routine chest CT on 12/15 and will follow up with Dr. MJana Hakimon 12/17. She understands and agrees with this plan. She knows the goal of treatment in her case is control. She has been encouraged to call with any issues that might arise before her next visit here.   FFrutoso Chase  Easton Fetty L, NP   06/16/2014 10:57 AM

## 2014-06-17 ENCOUNTER — Telehealth: Payer: Self-pay | Admitting: Oncology

## 2014-06-17 LAB — URINE CULTURE

## 2014-06-17 NOTE — Telephone Encounter (Signed)
Nyra Capes at [facility] urology and she will contact pt with appt once records recieved.Marland KitchenMarland Kitchen

## 2014-06-17 NOTE — Telephone Encounter (Signed)
Faxed pt medical records to [facility] [phone] °

## 2014-06-22 ENCOUNTER — Ambulatory Visit (HOSPITAL_COMMUNITY)
Admission: RE | Admit: 2014-06-22 | Discharge: 2014-06-22 | Disposition: A | Discharge status: Home / Self Care | Payer: Medicare Other | Source: Ambulatory Visit | Attending: Nurse Practitioner | Admitting: Nurse Practitioner

## 2014-06-22 DIAGNOSIS — J984 Other disorders of lung: Secondary | ICD-10-CM | POA: Diagnosis not present

## 2014-06-22 DIAGNOSIS — R809 Proteinuria, unspecified: Secondary | ICD-10-CM | POA: Diagnosis present

## 2014-06-22 DIAGNOSIS — I700 Atherosclerosis of aorta: Secondary | ICD-10-CM | POA: Diagnosis not present

## 2014-06-22 DIAGNOSIS — R319 Hematuria, unspecified: Secondary | ICD-10-CM | POA: Diagnosis not present

## 2014-06-22 DIAGNOSIS — K5730 Diverticulosis of large intestine without perforation or abscess without bleeding: Secondary | ICD-10-CM | POA: Diagnosis not present

## 2014-06-22 DIAGNOSIS — M5136 Other intervertebral disc degeneration, lumbar region: Secondary | ICD-10-CM | POA: Insufficient documentation

## 2014-06-22 DIAGNOSIS — K7689 Other specified diseases of liver: Secondary | ICD-10-CM | POA: Diagnosis not present

## 2014-06-22 DIAGNOSIS — M12852 Other specific arthropathies, not elsewhere classified, left hip: Secondary | ICD-10-CM | POA: Diagnosis not present

## 2014-06-22 DIAGNOSIS — N8184 Pelvic muscle wasting: Secondary | ICD-10-CM | POA: Insufficient documentation

## 2014-06-22 DIAGNOSIS — K760 Fatty (change of) liver, not elsewhere classified: Secondary | ICD-10-CM | POA: Diagnosis not present

## 2014-06-22 DIAGNOSIS — R109 Unspecified abdominal pain: Secondary | ICD-10-CM | POA: Insufficient documentation

## 2014-06-22 DIAGNOSIS — M899 Disorder of bone, unspecified: Secondary | ICD-10-CM | POA: Insufficient documentation

## 2014-06-22 DIAGNOSIS — R808 Other proteinuria: Secondary | ICD-10-CM | POA: Diagnosis not present

## 2014-06-22 DIAGNOSIS — N200 Calculus of kidney: Secondary | ICD-10-CM

## 2014-06-28 DIAGNOSIS — Z961 Presence of intraocular lens: Secondary | ICD-10-CM | POA: Diagnosis not present

## 2014-07-05 ENCOUNTER — Other Ambulatory Visit: Payer: Self-pay | Admitting: Nurse Practitioner

## 2014-07-05 DIAGNOSIS — R809 Proteinuria, unspecified: Secondary | ICD-10-CM

## 2014-07-12 ENCOUNTER — Ambulatory Visit (HOSPITAL_COMMUNITY)
Admission: RE | Admit: 2014-07-12 | Discharge: 2014-07-12 | Disposition: A | Discharge status: Home / Self Care | Payer: Medicare Other | Source: Ambulatory Visit | Attending: Oncology | Admitting: Oncology

## 2014-07-12 ENCOUNTER — Encounter (HOSPITAL_COMMUNITY): Payer: Self-pay

## 2014-07-12 DIAGNOSIS — C7801 Secondary malignant neoplasm of right lung: Secondary | ICD-10-CM | POA: Insufficient documentation

## 2014-07-12 DIAGNOSIS — C7981 Secondary malignant neoplasm of breast: Secondary | ICD-10-CM | POA: Diagnosis not present

## 2014-07-12 DIAGNOSIS — C50911 Malignant neoplasm of unspecified site of right female breast: Secondary | ICD-10-CM

## 2014-07-12 DIAGNOSIS — C7800 Secondary malignant neoplasm of unspecified lung: Secondary | ICD-10-CM

## 2014-07-12 DIAGNOSIS — K760 Fatty (change of) liver, not elsewhere classified: Secondary | ICD-10-CM | POA: Diagnosis not present

## 2014-07-12 MED ORDER — IOHEXOL 300 MG/ML  SOLN
100.0000 mL | Freq: Once | INTRAMUSCULAR | Status: AC | PRN
  Administered 2014-07-12: 100 mL via INTRAVENOUS

## 2014-07-12 MED ORDER — IOHEXOL 300 MG/ML  SOLN
50.0000 mL | Freq: Once | INTRAMUSCULAR | Status: AC | PRN
  Administered 2014-07-12: 50 mL via ORAL

## 2014-07-13 IMAGING — CT CT CHEST W/ CM
2 of 5 series · 16 of 46 positions shown, 18 images · IV contrast (OMNIPAQUE)
Comparison: 11/03/2012

CLINICAL DATA: Breast cancer diagnosed in 1889 and [address]39.  Stage
IV.  Chemotherapy in progress.  Right mastectomy.  Constipation.

CT CHEST, ABDOMEN AND PELVIS WITH CONTRAST
TECHNIQUE: Contiguous axial images of the chest abdomen and pelvis
were obtained after IV contrast administration.
Contrast: 100  ml [id number]

[Series 2: cap with st · axial · 0.79mm/px · z∈[-578,-53]mm · 13 of 121 slices shown, 15 images]
[im 8/121  soft-tissue]
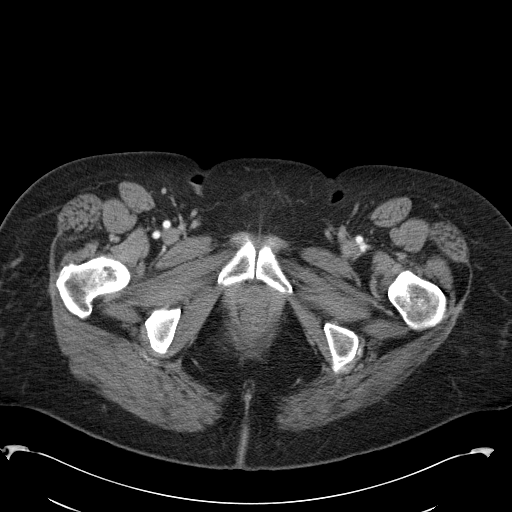
[im 8/121  bone]
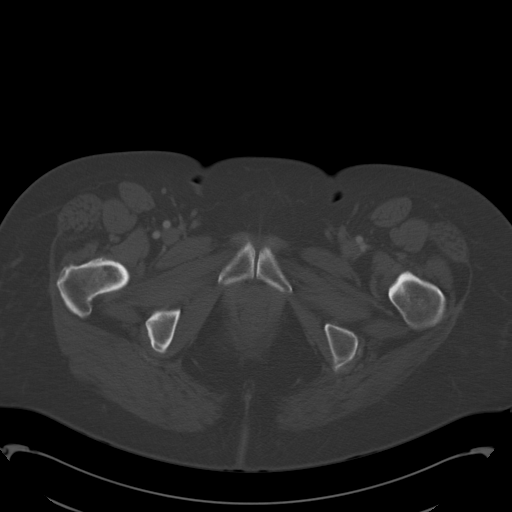
[im 15/121  soft-tissue]
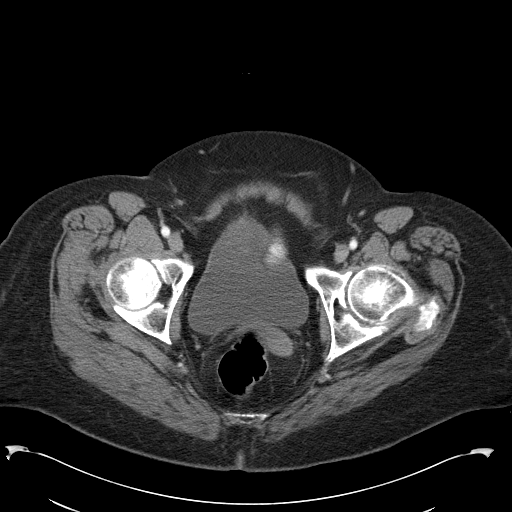
[im 29/121  soft-tissue]
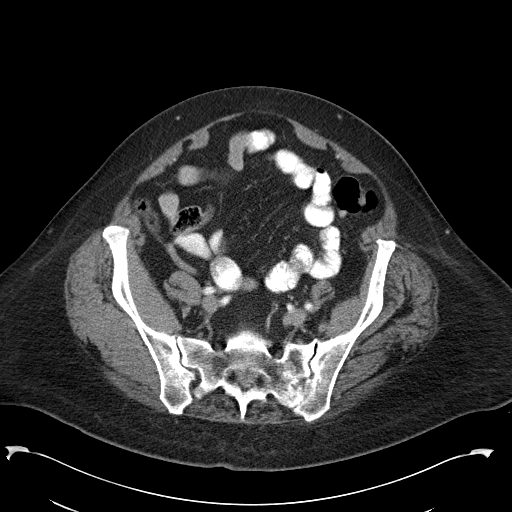
[im 36/121  soft-tissue]
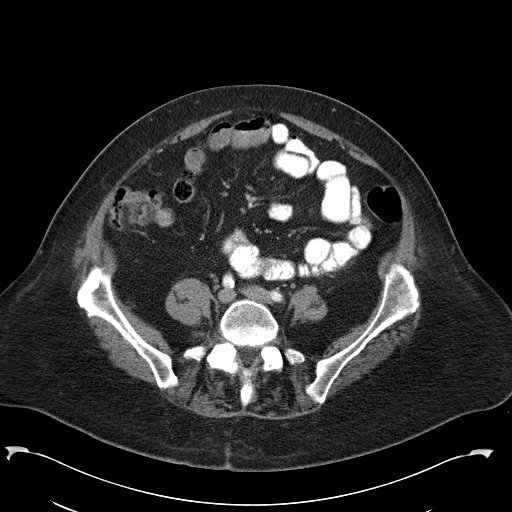
[im 43/121  soft-tissue]
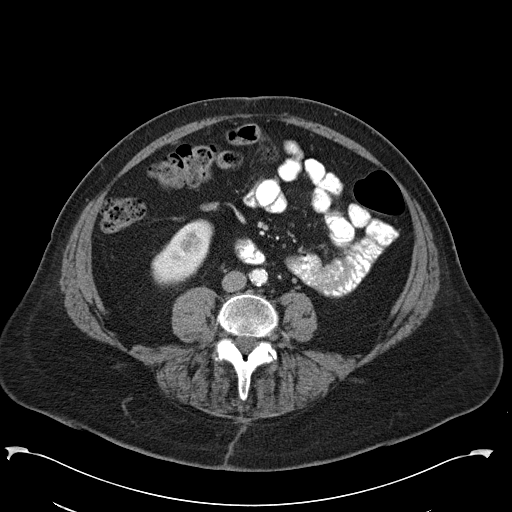
[im 50/121  soft-tissue]
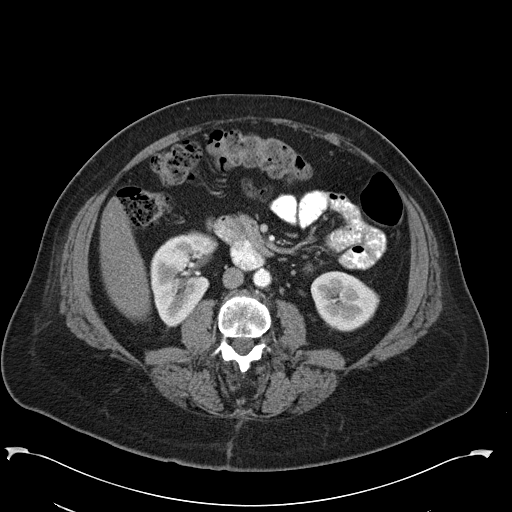
[im 64/121  soft-tissue]
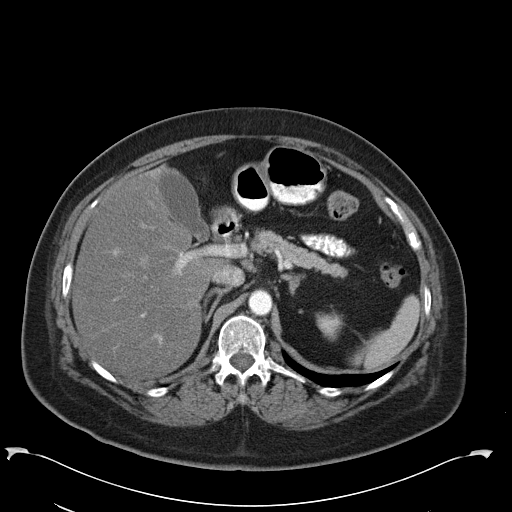
[im 71/121  soft-tissue]
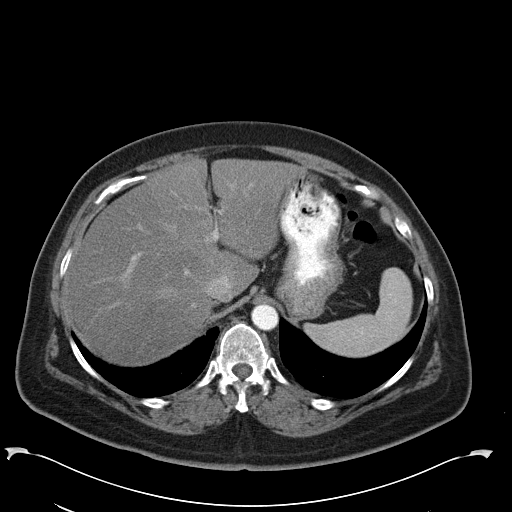
[im 78/121  soft-tissue]
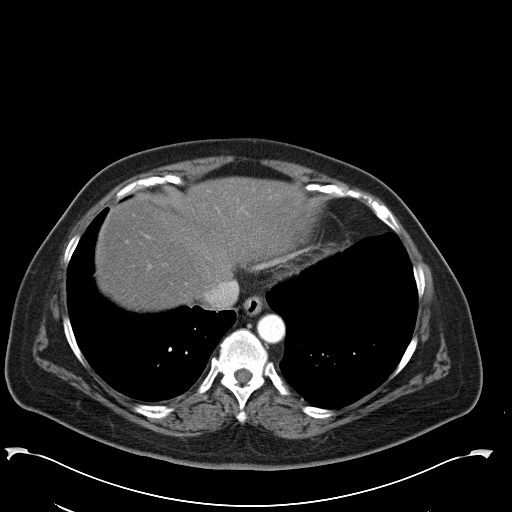
[im 78/121  bone]
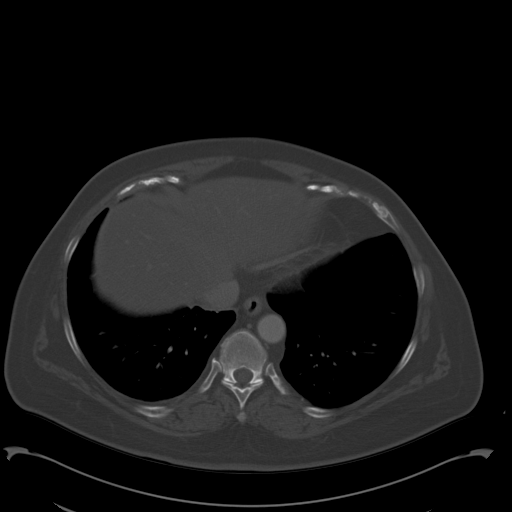
[im 85/121  soft-tissue]
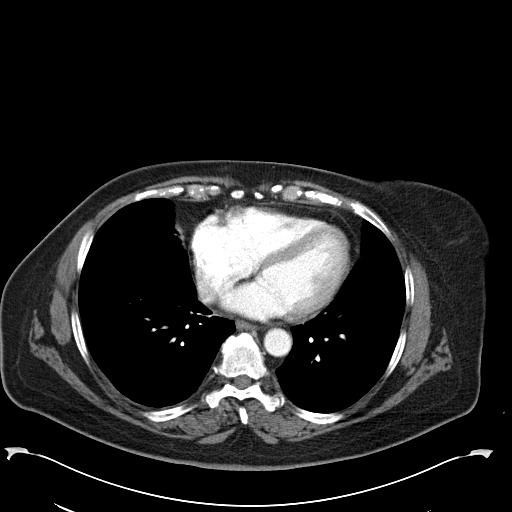
[im 92/121  soft-tissue]
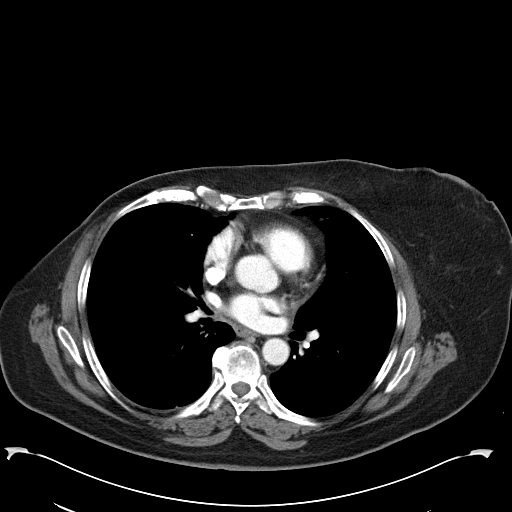
[im 106/121  soft-tissue]
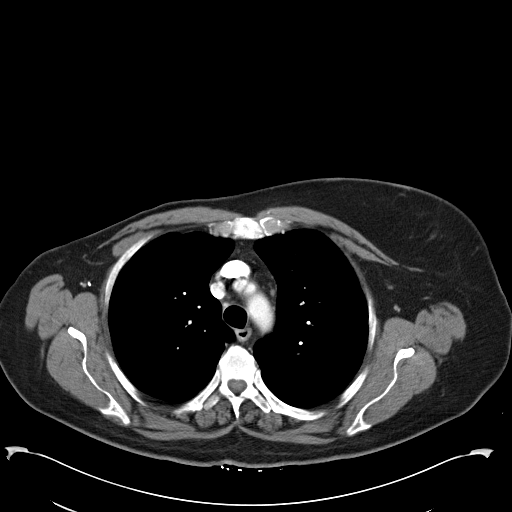
[im 113/121  soft-tissue]
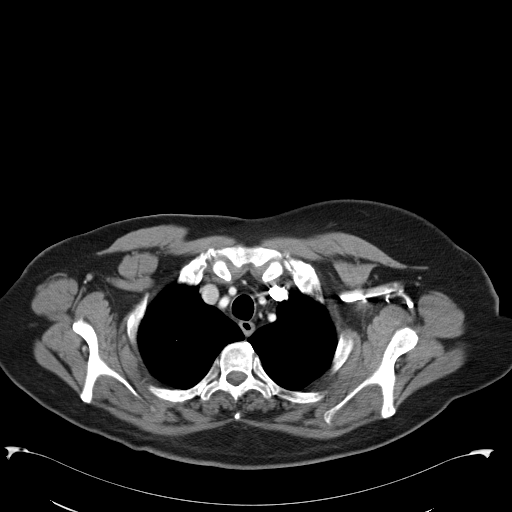

[Series 602: <mpr thick range> · coronal · 1.17mm/px · 3 of 100 slices shown]
[im 34/100  soft-tissue]
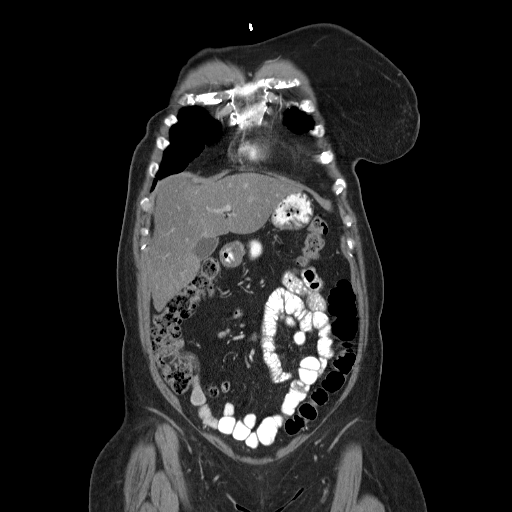
[im 45/100  soft-tissue]
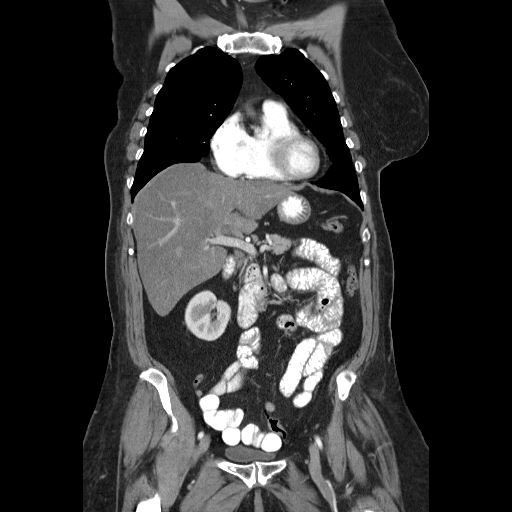
[im 56/100  soft-tissue]
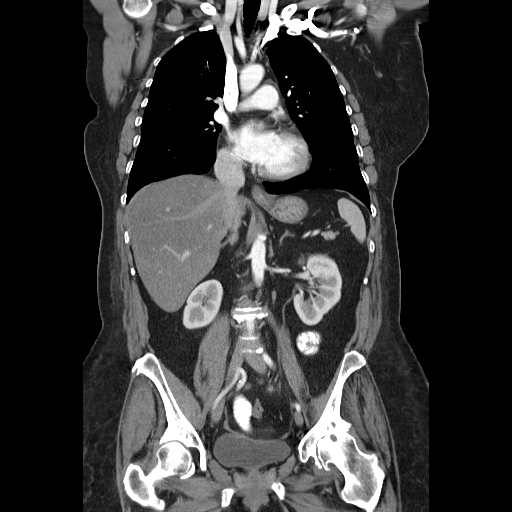

[16 of 46 positions shown; findings below may reference images not displayed]

CT CHEST

RECIST protocol

Target Lesions:

1.Right lower lobe lung "nodule" 5 mm on image 28/series 5 versus 6
mm on the prior.
2.  Subcarinal lymph node 9 mm short axis on image 28/series 2
versus 8 mm on the prior.
3.  Right hilar node 9 mm on image 26/series 2 versus similar on
the prior exam (when remeasured).

Non Target lesions:

1. Findings suspicious for osseous metastasis.  Similar.
FINDINGS: Lung windows demonstrate mild scarring at the right lung
base.  Clear left lung.

Soft tissue windows demonstrate no supraclavicular adenopathy.
Right mastectomy and axillary nodal dissection. No axillary
adenopathy.  Tortuous descending thoracic aorta.  Heart size upper
normal, without pericardial effusion.  Trace right-sided pleural
thickening is unchanged. No central pulmonary embolism, on this non-
dedicated study.  No mediastinal or hilar adenopathy.  No internal
mammary adenopathy.

A sclerotic lesion involving the left posterior 11th rib is similar
on image 45/series 2.  This is absent on 08/09/2010.
IMPRESSION: 1.  Similar eleventh posterior left rib sclerosis, suspicious for
osseous metastasis.
2.  No evidence of extra osseous metastasis within the chest.

CT ABDOMEN AND PELVIS
FINDINGS: Moderate to marked hepatic steatosis.  Similar vague
hyperenhancement the right lobe liver on image 52/series 2, likely
perfusion anomaly.  There is sparing of steatosis adjacent the
gallbladder.  Normal spleen, stomach.  Duodenal diverticulum
involves the transverse segment.  Normal pancreas, gallbladder,
biliary tract, right gland, kidneys.  Minimal left adrenal
nodularity is unchanged. No retroperitoneal or retrocrural
adenopathy.

Normal colon, appendix, and terminal ileum.  Normal small bowel
without abdominal ascites.

  No evidence of omental or peritoneal disease.

No pelvic adenopathy.    Normal urinary bladder and uterus.  No
adnexal mass.  No significant free fluid.

Advanced left hip osteoarthritis.  Vague sclerosis involving the
left iliac crest is unchanged.  A well-defined sclerotic lesion in
the right iliac crest is similar. Subtle sclerosis in the anterior
left sacrum on image 94 is similar.
IMPRESSION: 1.  Similar subtle osseous sclerosis, for which metastatic disease
cannot be excluded.
2.  No extra osseous metastasis within the abdomen/pelvis.
3.  Moderate to marked hepatic steatosis.

## 2014-07-14 ENCOUNTER — Ambulatory Visit (HOSPITAL_BASED_OUTPATIENT_CLINIC_OR_DEPARTMENT_OTHER): Payer: Medicare Other | Admitting: Oncology

## 2014-07-14 ENCOUNTER — Encounter: Payer: Self-pay | Admitting: *Deleted

## 2014-07-14 ENCOUNTER — Telehealth: Payer: Self-pay | Admitting: Oncology

## 2014-07-14 ENCOUNTER — Other Ambulatory Visit: Payer: Self-pay | Admitting: *Deleted

## 2014-07-14 ENCOUNTER — Other Ambulatory Visit (HOSPITAL_BASED_OUTPATIENT_CLINIC_OR_DEPARTMENT_OTHER): Payer: Medicare Other

## 2014-07-14 VITALS — BP 116/70 | HR 86 | Temp 97.5°F | Resp 18 | Ht 65.0 in | Wt 171.9 lb

## 2014-07-14 DIAGNOSIS — C50919 Malignant neoplasm of unspecified site of unspecified female breast: Secondary | ICD-10-CM

## 2014-07-14 DIAGNOSIS — C7800 Secondary malignant neoplasm of unspecified lung: Secondary | ICD-10-CM | POA: Diagnosis not present

## 2014-07-14 DIAGNOSIS — C7931 Secondary malignant neoplasm of brain: Secondary | ICD-10-CM

## 2014-07-14 DIAGNOSIS — C50412 Malignant neoplasm of upper-outer quadrant of left female breast: Secondary | ICD-10-CM | POA: Diagnosis not present

## 2014-07-14 DIAGNOSIS — R809 Proteinuria, unspecified: Secondary | ICD-10-CM | POA: Diagnosis not present

## 2014-07-14 DIAGNOSIS — C7951 Secondary malignant neoplasm of bone: Secondary | ICD-10-CM

## 2014-07-14 DIAGNOSIS — Z006 Encounter for examination for normal comparison and control in clinical research program: Secondary | ICD-10-CM

## 2014-07-14 DIAGNOSIS — M899 Disorder of bone, unspecified: Secondary | ICD-10-CM

## 2014-07-14 DIAGNOSIS — M8580 Other specified disorders of bone density and structure, unspecified site: Secondary | ICD-10-CM

## 2014-07-14 DIAGNOSIS — C50911 Malignant neoplasm of unspecified site of right female breast: Secondary | ICD-10-CM

## 2014-07-14 DIAGNOSIS — I700 Atherosclerosis of aorta: Secondary | ICD-10-CM | POA: Diagnosis not present

## 2014-07-14 LAB — CBC WITH DIFFERENTIAL/PLATELET
BASO%: 0.8 % (ref 0.0–2.0)
Basophils Absolute: 0.1 10e3/uL (ref 0.0–0.1)
EOS%: 1.7 % (ref 0.0–7.0)
Eosinophils Absolute: 0.1 10e3/uL (ref 0.0–0.5)
HCT: 41.7 % (ref 34.8–46.6)
HGB: 13.8 g/dL (ref 11.6–15.9)
LYMPH%: 15.6 % (ref 14.0–49.7)
MCH: 25.9 pg (ref 25.1–34.0)
MCHC: 33.1 g/dL (ref 31.5–36.0)
MCV: 78.2 fL — ABNORMAL LOW (ref 79.5–101.0)
MONO#: 0.7 10e3/uL (ref 0.1–0.9)
MONO%: 11.2 % (ref 0.0–14.0)
NEUT#: 4.6 10e3/uL (ref 1.5–6.5)
NEUT%: 70.7 % (ref 38.4–76.8)
Platelets: 287 10e3/uL (ref 145–400)
RBC: 5.33 10e6/uL (ref 3.70–5.45)
RDW: 13.8 % (ref 11.2–14.5)
WBC: 6.5 10e3/uL (ref 3.9–10.3)
lymph#: 1 10e3/uL (ref 0.9–3.3)

## 2014-07-14 LAB — PHOSPHORUS: Phosphorus: 3.8 mg/dL (ref 2.3–4.6)

## 2014-07-14 LAB — BILIRUBIN, DIRECT

## 2014-07-14 LAB — URINALYSIS, MICROSCOPIC - CHCC
Bilirubin (Urine): NEGATIVE
Glucose: NEGATIVE mg/dL
Ketones: NEGATIVE mg/dL
Leukocyte Esterase: NEGATIVE
Nitrite: NEGATIVE
Protein: 30 mg/dL
Specific Gravity, Urine: 1.025 (ref 1.003–1.035)
Urobilinogen, UR: 0.2 mg/dL (ref 0.2–1)
pH: 6 (ref 4.6–8.0)

## 2014-07-14 LAB — COMPREHENSIVE METABOLIC PANEL (CC13)
ALT: 24 U/L (ref 0–55)
AST: 24 U/L (ref 5–34)
Albumin: 3.6 g/dL (ref 3.5–5.0)
Alkaline Phosphatase: 202 U/L — ABNORMAL HIGH (ref 40–150)
Anion Gap: 13 meq/L — ABNORMAL HIGH (ref 3–11)
BUN: 24.2 mg/dL (ref 7.0–26.0)
CO2: 21 meq/L — ABNORMAL LOW (ref 22–29)
Calcium: 10.2 mg/dL (ref 8.4–10.4)
Chloride: 106 meq/L (ref 98–109)
Creatinine: 1.1 mg/dL (ref 0.6–1.1)
EGFR: 54 ml/min/1.73 m2 — ABNORMAL LOW
Glucose: 143 mg/dL — ABNORMAL HIGH (ref 70–140)
Potassium: 4.2 meq/L (ref 3.5–5.1)
Sodium: 139 meq/L (ref 136–145)
Total Bilirubin: 0.43 mg/dL (ref 0.20–1.20)
Total Protein: 8 g/dL (ref 6.4–8.3)

## 2014-07-14 LAB — LIPID PANEL
Cholesterol: 281 mg/dL — ABNORMAL HIGH (ref 0–200)
HDL: 52 mg/dL
LDL Cholesterol: 167 mg/dL — ABNORMAL HIGH (ref 0–99)
Total CHOL/HDL Ratio: 5.4 ratio
Triglycerides: 310 mg/dL — ABNORMAL HIGH
VLDL: 62 mg/dL — ABNORMAL HIGH (ref 0–40)

## 2014-07-14 LAB — CK: Total CK: 77 U/L (ref 7–177)

## 2014-07-14 LAB — URIC ACID (CC13): Uric Acid, Serum: 3.6 mg/dL (ref 2.6–7.4)

## 2014-07-14 MED ORDER — INV-EVEROLIMUS (RAD0001) 5MG TABLET NOVARTIS CRAD001Y24135: 2.0000 | ORAL_TABLET | Freq: Every day | ORAL | Status: DC

## 2014-07-14 NOTE — Progress Notes (Signed)
07/14/14 at 10:59am - [employer] Bolero 4 - cycle 37, day 1 study notes - The pt was into the cancer center this am for her fasting labs and cycle 37, day 1  assessments. The pt confirmed that she was fasting upon arrival to the cancer center for her labs. The pt returned her cycle 36 everolimus study drug boxes. She confirmed that she took her everolimus (10 mg) every day along with letrozole (2.5 mg) from 06/16/14 - 07/13/14 (28 days). The pt's everolimus drug boxes had 0 remaining (unopened) blister packs. The pt was dispensed 56 pills - 56 doses taken (28 days x 2 doses) = 0 returned. Therefore, the pt is 100% compliant with her study drug, everolimus, for cycle 36. The pt's everolimus was taken to the pharmacy for the drug accountability check and storage. The pt was seen and examined today by Dr. Jana Hakim.  Dr. Jana Hakim reviewed the pt's labs today, and he felt that her abnormal lab values were "not clinically significant". The pt continues to watch her sugar intake. Her glucose was elevated at 143 today.  The pt's vitals were stable, and her mean BP was obtained for study purposes. Dr. Jana Hakim reviewed the pt's scans, and he stated that her scans are "good," and there is no definitive evidence to suggest "progression" . Dr. Jana Hakim is aware that the pt's RECIST measurements point to "progressive disease" because there is a 20% increase in her measurements from her nadir in June 2015.  The [employer] team was notified that Dr. Jana Hakim wants to continue treating the pt on her current treatment.  Dr. Valeta Harms, Medical Monitor, agreed to allow the pt to remain on her current treatment and get a scan in 4 weeks for confirmation.  Dr. Jana Hakim ordered the scan in 4 weeks, and has asked the Radiologist to use smaller cuts to precisely measure the "right hilar lymph node" ( the target lesion in question of progression).   The pt reviewed her scans with Dr. Jana Hakim, and she was in agreement to continue her current  treatment plan. The pt denies any new adverse events. She reports her dysuria has resolved.  She reports the following AE's as ongoing: hot flashes, memory impairment, intermittent nausea, arthritis in hands, fatigue, back pain, and bilateral knee pain. The pt denies any new medications. She reports the following concomitant medications as ongoing: Lopressor, compazine prn, immodium prn, lopid, ibuprofen prn, and vitamin D3. She is performing all of her usual activities with some limitations. ECOG=1. The pt said she is traveling to see her family in [place]. The pt was dispensed her everolimus drug boxes (2 boxes now) for self administration for cycle 37. The pt's lipid panel is pending. The pt is aware of her January appointments.   07/14/14 at 1:54pm - The pt's lipid panel is stable.

## 2014-07-14 NOTE — Telephone Encounter (Signed)
, °

## 2014-07-14 NOTE — Progress Notes (Signed)
[facility]  [phone] [phone]     ID: Barbara Parks DOB: 1950-10-08  MR#: [id number]  NTZ#:[id number]  Patient Care Team: Barbara Smolder, MD as PCP - General (Family Medicine) Barbara Furbish, MD (Cardiology) Barbara Esters, MD (Hematology and Oncology) Barbara Pollack, MD (Cardiothoracic Surgery)  OTHER MD: Dr Barbara Parks- Barbara Parks, DDS  CHIEF COMPLAINT: metastatic breast cancer, on BOLERO study  CURRENT TREATMENT: letrozole + everolimus  BREAST CANCER HISTORY: From Dr. Collier Salina Parks's original intake note 04/13/2004:  "This woman has been in good health all of her life. She recently moved from [place] to work here.  She palpated a mass at about the 12 o'clock position in mid-July. She has not noticed any nipple retraction or skin changes.  She was seen by her primary care doctor who subsequently referred her for a mammogram.  Mammogram was performed on 03/01/04. This demonstrated a spiculated 2 cm mass at the 12 o'clock position in the right breast.  Upper outer quadrant of the left breast shows some distortion.   Physical exam at that time showed a firm, nontender nodule at the 12 o'clock position in the right breast, 5 cm from the nipple.  Ultrasound of this area showed a hypoechoic ill-defined mass, measuring 2.2 x 1.3 x 1.4 cm.  Physical exam of the left breast showed general vague thickening, upper outer quadrant of the left breast with a discrete palpable mass. The ultrasound performed showed a single hypoechoic ill-defined nodule at the 1 o'clock position, measuring 7 x 5 x 6 mm.  She had biopsies of both lesions on 03/02/03.  Needle core biopsy of the lesion on the right breast revealed invasive mammary carcinoma.  Needle core biopsy of the left breast showed a complex fibroadenoma.  Prognostic panel of the lesion on the right breast showed it to be ER positive at 73%, PR positive at 90% and proliferative index 9%, HER-2 was 1+.  Patient was  referred to Dr. Annamaria Boots, who performed a simple mastectomy with sentinel lymph node evaluation on 03/22/04.  Final pathology showed this to be an invasive ductal carcinoma  with lobular features, measuring 2.2 cm, grade 2 of 3.  Margins negative for carcinoma.  Invasive ductal carcinoma was extended to involve deep dermis of the nipple.  Lymphovascular invasion was identified.  Total of 4 sentinel lymph nodes were evaluated.  Touch imprints at the time of the OR was felt to be negative.  Subsequent evaluation showed a 5 mm focus of metastatic carcinoma in one of the four lymph nodes on microscopic after sectioning.  There was extracapsular extension  of one lymph node as well. The remaining three lymph nodes were all negative."  The patient's subsequent history is detailed below.  INTERVAL HISTORY: Barbara Parks returns to clinic today for follow up of her metastatic breast cancer, accompanied by our research nurse Barbara Parks. Barbara Parks on the BOLERO-4 trial, currently on cycle 37.she is tolerating the everolimus and letrozole well, with no significant hot flashes, vaginal dryness, or arthralgia/myalgia problems. She had a bone density earlier this year which was unremarkable. Since her last visit here, she had a CT scan which is essentially stable. At high right hilar lymph node was measured at 9 mm, versus 8 mm previously.  REVIEW OF SYSTEMS: A detailed review of systems today was otherwise entirely stable.  PAST MEDICAL HISTORY: Past Medical History  Diagnosis Date  . Asthma   . Shortness of breath   . Arthritis  left hip  . Contact dermatitis     from a bandaid that had Latex   . Osteopenia due to cancer therapy 09/09/2013  . Hypercholesteremia   . ADHD (attention deficit hyperactivity disorder)   . breast ca 2005    breast/chemo R mastectomy  . Metastasis to lung dx'd 08/2011    PAST SURGICAL HISTORY: Past Surgical History  Procedure Laterality Date  . Spine surgery  1996  . Breast surgery   2005    right  . Video bronchoscopy  09/11/2011    Procedure: VIDEO BRONCHOSCOPY;  Surgeon: Barbara Pollack, MD;  Location: [facility];  Service: Thoracic;  Laterality: N/A;  . Chest tube insertion  09/11/2011    Procedure: INSERTION PLEURAL DRAINAGE CATHETER;  Surgeon: Barbara Pollack, MD;  Location: [facility];  Service: Thoracic;  Laterality: Right;  . Pericardial window  09/11/2011    Procedure: PERICARDIAL WINDOW;  Surgeon: Barbara Pollack, MD;  Location: [facility];  Service: Thoracic;  Laterality: N/A;  . Removal of pleural drainage catheter  12/19/2011    Procedure: REMOVAL OF PLEURAL DRAINAGE CATHETER;  Surgeon: Barbara Pollack, MD;  Location: [facility];  Service: Thoracic;  Laterality: Right;  TO BE DONE IN MINOR ROOM, SHORT STAY  . Cataract extraction w/phaco Left 01/18/2014    Procedure: CATARACT EXTRACTION PHACO AND INTRAOCULAR LENS PLACEMENT (IOC);  Surgeon: Barbara Guadeloupe T. Gershon Crane, MD;  Location: [facility];  Service: Ophthalmology;  Laterality: Left;  CDE 15.79  . Cataract extraction w/phaco Right 02/08/2014    Procedure: CATARACT EXTRACTION PHACO AND INTRAOCULAR LENS PLACEMENT (IOC);  Surgeon: Barbara Guadeloupe T. Gershon Crane, MD;  Location: [facility];  Service: Ophthalmology;  Laterality: Right;  CDE 4.41    FAMILY HISTORY Family History  Problem Relation Age of Onset  . Cancer Mother     breast  . Heart disease Father   . Anesthesia problems Neg Hx    the patient's mother was diagnosed with breast cancer at age 20, she died age 85 from congestive heart failure..The patient's father died from heart disease at age 58.  She has one sister alive & well.  Two brothers alive & well, one with diabetes. The patient's sister was also diagnosed with breast cancer, and was tested for the BRCA gene, and was negative. The patient herself has not been tested. There is no history of ovarian cancer in the family  GYNECOLOGIC HISTORY:  No LMP recorded. Patient is postmenopausal. Menarche age 10, the patient is GX P0. She stopped having periods  with chemotherapy in 2005. 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 She was a Archivist. She is single, lives alone with her Shitzu-poodle Sammie.. Family is all in the [place] area.     ADVANCED DIRECTIVES: Not in place. At the 02/23/2014 visit the patient was given the appropriate documents to complete and notarize at her discretion. She tells me she is planning to name her sister, Billie Ruddy, as healthcare power of attorney. [name] can be reached at [phone]   HEALTH MAINTENANCE: History  Substance Use Topics  . Smoking status: Former Smoker -- 1.50 packs/day for 30 years    Types: Cigarettes    Quit date: 09/10/2007  . Smokeless tobacco: Not on file  . Alcohol Use: No     Colonoscopy:  PAP:  Bone density: 08/11/2013, showed osteopenia  Lipid panel:  Allergies  Allergen Reactions  .  Aspirin     REACTION: upset stomach  . Latex Other (See Comments)    Blistering and skin peels off  . Nsaids Nausea And Vomiting    Extreme nausea and vomiting    Current Outpatient Prescriptions  Medication Sig Dispense Refill  . cholecalciferol (VITAMIN D) 1000 UNITS tablet Take 2,000 Units by mouth daily.     Marland Kitchen gemfibrozil (LOPID) 600 MG tablet TAKE ONE TABLET BY MOUTH TWICE DAILY BEFORE A  MEAL 90 tablet 0  . ibuprofen (ADVIL,MOTRIN) 200 MG tablet Take 400 mg by mouth every 6 (six) hours as needed. For pain    . Investigational everolimus (RAD001) 5 MG tablet Novartis [id number] Take 2 tablets by mouth daily. Take with a glass of water. 70 tablet 0  . Investigational everolimus (RAD001) 5 MG tablet Novartis [id number] Take 2 tablets by mouth daily. Take with a glass of water. 70 tablet 0  . Investigational everolimus (RAD001) 5 MG tablet Novartis [id number] Take 2 tablets by mouth daily. Take with a glass of water. 70 tablet 0  . Investigational  everolimus (RAD001) 5 MG tablet Novartis [id number] Take 2 tablets by mouth daily. Take with a glass of water. 70 tablet 0  . letrozole (FEMARA) 2.5 MG tablet Take 1 tablet (2.5 mg total) by mouth daily. 90 tablet 4  . letrozole (FEMARA) 2.5 MG tablet Take 1 tablet (2.5 mg total) by mouth daily. 30 tablet 0  . loperamide (IMODIUM) 2 MG capsule Take 2 mg by mouth 4 (four) times daily as needed for diarrhea or loose stools.    Marland Kitchen LORazepam (ATIVAN) 0.5 MG tablet Take 1 tablet (0.5 mg total) by mouth every 6 (six) hours as needed (Nausea or vomiting). 30 tablet 0  . metoprolol tartrate (LOPRESSOR) 25 MG tablet Take 0.5 tablets (12.5 mg total) by mouth daily. 30 tablet 4  . omeprazole (PRILOSEC) 40 MG capsule Take 1 capsule (40 mg total) by mouth daily. 90 capsule 3  . ondansetron (ZOFRAN) 8 MG tablet Take 1 tablet (8 mg total) by mouth 2 (two) times daily as needed (Nausea or vomiting). 30 tablet 1  . prochlorperazine (COMPAZINE) 10 MG tablet Take 1 tablet (10 mg total) by mouth every 6 (six) hours as needed (Nausea or vomiting). 30 tablet 1  . prochlorperazine (COMPAZINE) 25 MG suppository Place 1 suppository (25 mg total) rectally every 12 (twelve) hours as needed for nausea. 12 suppository 3   No current facility-administered medications for this visit.    OBJECTIVE: Middle-aged white woman who walks with a cane  Filed Vitals:   07/14/14 0942  BP: 116/70  Pulse:   Temp:   Resp:      Body mass index is 28.61 kg/(m^2).    ECOG FS:1 - Symptomatic but completely ambulatory   Sclerae unicteric, EOMs intact Oropharynx clear, slightly dry No cervical or supraclavicular adenopathy Lungs no rales or rhonchi Heart regular rate and rhythm Abd soft, nontender, positive bowel sounds MSK no focal spinal tenderness, no upper extremity lymphedema Neuro: nonfocal, well oriented, appropriate affect Breasts: the right breast is status post mastectomy. There is no evidence of chest wall recurrence. The  right axilla is benign per the left breast is unremarkable.  LAB RESULTS:  CMP     Component Value Date/Time   NA 139 07/14/2014 0917   NA 136 07/16/2012 0910   K 4.2 07/14/2014 0917   K 4.0 07/16/2012 0910   CL 109* 12/31/2012 0940   CL 103 07/16/2012 0910   CO2  21* 07/14/2014 0917   CO2 21 07/16/2012 0910   GLUCOSE 143* 07/14/2014 0917   GLUCOSE 127* 12/31/2012 0940   GLUCOSE 140* 07/16/2012 0910   BUN 24.2 07/14/2014 0917   BUN 16 07/16/2012 0910   CREATININE 1.1 07/14/2014 0917   CREATININE 0.75 07/16/2012 0910   CALCIUM 10.2 07/14/2014 0917   CALCIUM 10.1 07/16/2012 0910   PROT 8.0 07/14/2014 0917   PROT 8.0 07/16/2012 0910   ALBUMIN 3.6 07/14/2014 0917   ALBUMIN 3.7 07/16/2012 0910   AST 24 07/14/2014 0917   AST 30 07/16/2012 0910   ALT 24 07/14/2014 0917   ALT 33 07/16/2012 0910   ALKPHOS 202* 07/14/2014 0917   ALKPHOS 142* 07/16/2012 0910   BILITOT 0.43 07/14/2014 0917   BILITOT 0.2* 07/16/2012 0910   GFRNONAA >90 09/13/2011 0440   GFRAA >90 09/13/2011 0440    I No results found for: SPEP  Lab Results  Component Value Date   WBC 6.5 07/14/2014   NEUTROABS 4.6 07/14/2014   HGB 13.8 07/14/2014   HCT 41.7 07/14/2014   MCV 78.2* 07/14/2014   PLT 287 07/14/2014      Chemistry      Component Value Date/Time   NA 139 07/14/2014 0917   NA 136 07/16/2012 0910   K 4.2 07/14/2014 0917   K 4.0 07/16/2012 0910   CL 109* 12/31/2012 0940   CL 103 07/16/2012 0910   CO2 21* 07/14/2014 0917   CO2 21 07/16/2012 0910   BUN 24.2 07/14/2014 0917   BUN 16 07/16/2012 0910   CREATININE 1.1 07/14/2014 0917   CREATININE 0.75 07/16/2012 0910      Component Value Date/Time   CALCIUM 10.2 07/14/2014 0917   CALCIUM 10.1 07/16/2012 0910   ALKPHOS 202* 07/14/2014 0917   ALKPHOS 142* 07/16/2012 0910   AST 24 07/14/2014 0917   AST 30 07/16/2012 0910   ALT 24 07/14/2014 0917   ALT 33 07/16/2012 0910   BILITOT 0.43 07/14/2014 0917   BILITOT 0.2* 07/16/2012 0910        Lab Results  Component Value Date   LABCA2 58* 09/14/2012    No components found for: [id number]  No results for input(s): INR in the last 168 hours.  Urinalysis    Component Value Date/Time   COLORURINE YELLOW 09/11/2011 [place] 09/11/2011 0819   LABSPEC 1.025 07/14/2014 0913   LABSPEC 1.013 09/11/2011 0819   PHURINE 6.5 09/11/2011 0819   GLUCOSEU Negative 07/14/2014 0913   GLUCOSEU NEGATIVE 09/11/2011 0819   HGBUR SMALL* 09/11/2011 0819   BILIRUBINUR NEGATIVE 09/11/2011 0819   KETONESUR 15* 09/11/2011 0819   PROTEINUR NEGATIVE 09/11/2011 0819   UROBILINOGEN 0.2 07/14/2014 0913   UROBILINOGEN 0.2 09/11/2011 0819   NITRITE NEGATIVE 09/11/2011 0819   LEUKOCYTESUR NEGATIVE 09/11/2011 0819    STUDIES: Ct Abdomen Pelvis Wo Contrast  06/22/2014   CLINICAL DATA:  Left flank pain with hematuria and proteinuria. History of breast cancer, chemotherapy ongoing. Initial encounter.  EXAM: CT ABDOMEN AND PELVIS WITHOUT CONTRAST  TECHNIQUE: Multidetector CT imaging of the abdomen and pelvis was performed following the standard protocol without IV contrast.  COMPARISON:  Abdominal pelvic CT 05/17/2014.  FINDINGS: Lower chest: There is stable mild subpleural scarring in the right middle lobe. No suspicious pulmonary nodules are demonstrated. There is no pleural effusion. Prior right mastectomy with breast prosthesis noted.  Hepatobiliary: Severe mildly heterogeneous hepatic steatosis is again noted. No focal lesions are apparent on non contrast imaging. No  evidence of gallstones or gallbladder wall thickening. There is mild biliary dilatation to 8 mm. This appears similar to the prior examination.  Pancreas: The pancreas is atrophied. There is no evidence of pancreatic mass or pancreatic ductal dilatation.  Spleen: Normal in size without focal abnormality.  Adrenals/Urinary Tract: Both adrenal glands appear normal.The kidneys appear normal without evidence of urinary tract  calculus or hydronephrosis. No bladder abnormalities are seen.  Stomach/Bowel: No evidence of bowel wall thickening, distention or surrounding inflammatory change.There is prominent ingested material within the stomach. Proximal duodenal diverticuli appear stable. The appendix appears normal. There is mild colonic diverticulosis.  Vascular/Lymphatic: There are no enlarged abdominal or pelvic lymph nodes. Mild aortoiliac atherosclerosis appears stable.  Reproductive: No evidence of pelvic mass or inflammatory process. The uterus and ovaries appear unchanged.  Other: No evidence of abdominal wall mass or hernia.  Musculoskeletal: No acute or significant osseous findings. Sclerotic lesion in the left sacral ala is unchanged. There is stable degenerative disc disease at L3-4 and advanced asymmetric left hip arthropathy. There is associated left pelvic muscular atrophy.  IMPRESSION: 1. No evidence of urinary tract calculus or hydronephrosis. 2. No evidence of metastatic disease. 3. Stable heterogeneous hepatic steatosis, mild extrahepatic biliary dilatation, proximal duodenal diverticula and degenerative changes.   Electronically Signed   By: [name] M.D.   On: 06/22/2014 13:43   Ct Chest W Contrast  07/12/2014   CLINICAL DATA:  Metastatic right breast cancer. Diagnosed 2005 with recurrence in 2013.  EXAM: CT CHEST, ABDOMEN, AND PELVIS WITH CONTRAST  TECHNIQUE: Multidetector CT imaging of the chest, abdomen and pelvis was performed following the standard protocol during bolus administration of intravenous contrast.  CONTRAST:  154m OMNIPAQUE IOHEXOL 300 MG/ML  SOLN  COMPARISON:  04/2014.  [facility] study dated 06/22/2014.  FINDINGS: RECIST 1.0  Target Lesions:  1. Right lower lobe pulmonary nodule which is unchanged on image 40. 6 mm today versus 6 mm on the prior. 2. Partially calcified subcarinal node which is unchanged at 6 mm on image 29. 3. Right hilar node which measures 9 mm on image 29 versus 8 mm on the  prior. Non-target Lesions:  None  CT CHEST FINDINGS  Lungs/Pleura: Subpleural right lower lobe pulmonary nodule which measures 6 mm on image 40 of series 4. Similar to on the prior. Clear left lung. Trace right pleural fluid or thickening, similar.  Heart/Mediastinum: No supraclavicular adenopathy. Right mastectomy and axillary node dissection. No axillary adenopathy. Normal heart size, without pericardial effusion. No central pulmonary embolism, on this non-dedicated study. No mediastinal or hilar adenopathy.  CT ABDOMEN AND PELVIS FINDINGS  Hepatobiliary: Moderate hepatic steatosis. Non masslike relative hyperattenuation in the right lobe on image 55 is similar and may represent sparing of steatosis. There is also sparing adjacent the gallbladder. Normal gallbladder, without biliary ductal dilatation.  Pancreas: Mild pancreatic atrophy  Spleen: Normal  Adrenals/Urinary tract: Normal adrenal glands. Normal kidneys, without hydronephrosis. Normal urinary bladder.  Stomach/Bowel: Normal stomach, without wall thickening. Transverse duodenal diverticulum. Normal colon, appendix, and terminal ileum. Otherwise normal small bowel.  Vascular/Lymphatic: Aortic and branch vessel atherosclerosis. No abdominopelvic adenopathy.  Reproductive: Normal uterus and adnexa.  Other:  No significant free fluid.  Musculoskeletal: Left hip osteoarthritis which is advanced. Left sacral sclerotic lesion is not significantly changed. Vague area of left iliac wing sclerosis is also similar.  Eleventh left rib posterior medial sclerosis is not significantly changed. This may be posttraumatic.  IMPRESSION: CT CHEST IMPRESSION  1. No extraosseous  metastasis within the chest. 2. Right mastectomy and axillary node dissection.  CT ABDOMEN AND PELVIS IMPRESSION  1. No extraosseous metastasis within the abdomen or pelvis. 2. Sclerotic lesions within the left sacrum and iliac wing are similar and remains suspicious for osseous metastasis. 3.  Heterogeneous hepatic steatosis.   Electronically Signed   By: Abigail Miyamoto M.D.   On: 07/12/2014 14:19   Ct Abdomen Pelvis W Contrast  07/12/2014   CLINICAL DATA:  Metastatic right breast cancer. Diagnosed 2005 with recurrence in 2013.  EXAM: CT CHEST, ABDOMEN, AND PELVIS WITH CONTRAST  TECHNIQUE: Multidetector CT imaging of the chest, abdomen and pelvis was performed following the standard protocol during bolus administration of intravenous contrast.  CONTRAST:  162m OMNIPAQUE IOHEXOL 300 MG/ML  SOLN  COMPARISON:  04/2014.  [facility] study dated 06/22/2014.  FINDINGS: RECIST 1.0  Target Lesions:  1. Right lower lobe pulmonary nodule which is unchanged on image 40. 6 mm today versus 6 mm on the prior. 2. Partially calcified subcarinal node which is unchanged at 6 mm on image 29. 3. Right hilar node which measures 9 mm on image 29 versus 8 mm on the prior. Non-target Lesions:  None  CT CHEST FINDINGS  Lungs/Pleura: Subpleural right lower lobe pulmonary nodule which measures 6 mm on image 40 of series 4. Similar to on the prior. Clear left lung. Trace right pleural fluid or thickening, similar.  Heart/Mediastinum: No supraclavicular adenopathy. Right mastectomy and axillary node dissection. No axillary adenopathy. Normal heart size, without pericardial effusion. No central pulmonary embolism, on this non-dedicated study. No mediastinal or hilar adenopathy.  CT ABDOMEN AND PELVIS FINDINGS  Hepatobiliary: Moderate hepatic steatosis. Non masslike relative hyperattenuation in the right lobe on image 55 is similar and may represent sparing of steatosis. There is also sparing adjacent the gallbladder. Normal gallbladder, without biliary ductal dilatation.  Pancreas: Mild pancreatic atrophy  Spleen: Normal  Adrenals/Urinary tract: Normal adrenal glands. Normal kidneys, without hydronephrosis. Normal urinary bladder.  Stomach/Bowel: Normal stomach, without wall thickening. Transverse duodenal diverticulum. Normal colon,  appendix, and terminal ileum. Otherwise normal small bowel.  Vascular/Lymphatic: Aortic and branch vessel atherosclerosis. No abdominopelvic adenopathy.  Reproductive: Normal uterus and adnexa.  Other:  No significant free fluid.  Musculoskeletal: Left hip osteoarthritis which is advanced. Left sacral sclerotic lesion is not significantly changed. Vague area of left iliac wing sclerosis is also similar.  Eleventh left rib posterior medial sclerosis is not significantly changed. This may be posttraumatic.  IMPRESSION: CT CHEST IMPRESSION  1. No extraosseous metastasis within the chest. 2. Right mastectomy and axillary node dissection.  CT ABDOMEN AND PELVIS IMPRESSION  1. No extraosseous metastasis within the abdomen or pelvis. 2. Sclerotic lesions within the left sacrum and iliac wing are similar and remains suspicious for osseous metastasis. 3. Heterogeneous hepatic steatosis.   Electronically Signed   By: KAbigail MiyamotoM.D.   On: 07/12/2014 14:19      ASSESSMENT: 63y.o. [place], [place] woman with stage IV breast cancer, on BOLERO-4 trial  (1) status post right mastectomy and sentinel lymph node sampling 03/22/2004 for a pT2 pN1, stage IIB invasive ductal carcinoma with lobular features, grade 2, estrogen receptor and progesterone receptor positive, HER-2 negative, with an MIB-1 of 9% (([id number] [id number]  (2) addition all right axillary lymph node sampling 05/07/2004 showed 2 benign lymph nodes (4 lymph nodes previously removed, so total was one positive lymph node out of 6; [id number]  (3) the patient was evaluated by radiation oncology; no  postmastectomy radiation recommended  (4) adjuvant chemotherapy with dose dense doxorubicin and cyclophosphamide x4 cycles (first cycle delayed one week) followed by dose dense paclitaxel x4 was completed 09/18/2004  (5) tamoxifen started March 2006, discontinued 2009  METASTATIC DISEASE: (6) presenting with a large pericardial effusion, large right pleural  effusion and possible right middle lobe bronchial obstruction February 2013, status post pericardial window placement, fiberoptic bronchoscopy and right Pleurx placement 09/11/2011,, with biopsy of the bronchus intermedius and pericardial positive for metastatic breast cancer, estrogen receptor 91% positive with moderate staining intensity, progesterone receptor 100% positive with strong staining intensity, with an MIB-1 of 35%, and no HER-2 amplification, the signals ratio being 1.37 ([id number])  (7) enrolled in BOLERO-4 trial 10/10/2011, receiving letrozole and everolimus  (a) two small areas of enhancement in the cerebellum noted by brain MRI 10/03/2011 were no longer apparent on repeat MRI 08/21/2012-- most recent brain MRI 04/01/2014 showed no evidence of intracranial metastatic disease  (b) sclerotic lesions in left iliac bone and sacrum have not been biopsied; stable; plan is to start zolendronate after patient updates her dental care (extraction planned)  (c) RLL lung nodule, stable (rescanned 07/12/2014) R hilar and subcarinal lymph nodes: Essentially stable  (9) additional problems:  (a) hepatic steatosis  (b) COPD/ emphysema  (c) advanced L hip osteoarthritis  (d) aortoiliac atherosclerosis  (e) dental evaluation pending w possible dental extractions  (f) possible thalassemia trait  (10) Bone density concerns: DEXA scan 08/11/2013 was normal   PLAN: Sondra is tolerating her treatment well and my reading of her recent scan is that she is stable. There is sufficient "Judd Lien" in CT scan readings that a change of a millimeter here and there cannot be read as progression. Instead we would be wanting to see a significant change in size or a steady progression in volume. We have discussed this with the study chair and they are in agreement but did suggest that we repeat a CT scan in a month. I am going to request fine cuts through the area in question to see we can get a more definitive  determination.  Otherwise we are continuing the letrozole and everolimus as before. She will see mein mid January, right after the CT scan, to discuss results. In the meantime, she will be driving up [place] to visit family. She knows to call for any problems that may develop before her next visit here.  Chauncey Cruel, MD   07/15/2014 4:49 PM

## 2014-07-15 NOTE — Addendum Note (Signed)
Addended by: Laureen Abrahams on: 07/15/2014 05:19 PM   Modules accepted: Orders, Medications

## 2014-08-08 ENCOUNTER — Other Ambulatory Visit: Payer: Self-pay | Admitting: Oncology

## 2014-08-08 DIAGNOSIS — R312 Other microscopic hematuria: Secondary | ICD-10-CM | POA: Diagnosis not present

## 2014-08-10 ENCOUNTER — Other Ambulatory Visit: Payer: Self-pay | Admitting: Oncology

## 2014-08-11 ENCOUNTER — Other Ambulatory Visit: Payer: Medicare Other

## 2014-08-11 ENCOUNTER — Encounter: Payer: Self-pay | Admitting: *Deleted

## 2014-08-11 ENCOUNTER — Encounter (HOSPITAL_COMMUNITY): Payer: Self-pay

## 2014-08-11 ENCOUNTER — Other Ambulatory Visit: Payer: Self-pay | Admitting: *Deleted

## 2014-08-11 ENCOUNTER — Ambulatory Visit: Payer: Medicare Other | Admitting: Nurse Practitioner

## 2014-08-11 ENCOUNTER — Other Ambulatory Visit: Payer: Self-pay | Admitting: Oncology

## 2014-08-11 ENCOUNTER — Ambulatory Visit (HOSPITAL_COMMUNITY)
Admission: RE | Admit: 2014-08-11 | Discharge: 2014-08-11 | Disposition: A | Discharge status: Home / Self Care | Payer: Medicare Other | Source: Ambulatory Visit | Attending: Oncology | Admitting: Oncology

## 2014-08-11 DIAGNOSIS — M899 Disorder of bone, unspecified: Secondary | ICD-10-CM | POA: Insufficient documentation

## 2014-08-11 DIAGNOSIS — R911 Solitary pulmonary nodule: Secondary | ICD-10-CM | POA: Diagnosis not present

## 2014-08-11 DIAGNOSIS — C50919 Malignant neoplasm of unspecified site of unspecified female breast: Secondary | ICD-10-CM

## 2014-08-11 DIAGNOSIS — C7800 Secondary malignant neoplasm of unspecified lung: Secondary | ICD-10-CM | POA: Diagnosis not present

## 2014-08-11 DIAGNOSIS — C50911 Malignant neoplasm of unspecified site of right female breast: Secondary | ICD-10-CM

## 2014-08-11 DIAGNOSIS — M8580 Other specified disorders of bone density and structure, unspecified site: Secondary | ICD-10-CM

## 2014-08-11 MED ORDER — IOHEXOL 300 MG/ML  SOLN
80.0000 mL | Freq: Once | INTRAMUSCULAR | Status: AC | PRN
  Administered 2014-08-11: 80 mL via INTRAVENOUS

## 2014-08-11 MED ORDER — INV-EVEROLIMUS (RAD0001) 5MG TABLET NOVARTIS CRAD001Y24135: 2.0000 | ORAL_TABLET | Freq: Every day | ORAL | Status: DC

## 2014-08-11 MED ORDER — GEMFIBROZIL 600 MG PO TABS: ORAL_TABLET | ORAL | Status: DC

## 2014-08-11 NOTE — Progress Notes (Signed)
08/11/14 at 2:05pm - [employer], cycle 38 day 0 study notes - The pt was into the clinic today for her CT chest (confirmation CT required by the study to assess for evidence of progression) and for her study drug. The pt is usually seen every 4 weeks for her H/P.  Dr. Jana Hakim was not available to see the pt today.  Therefore, the pt would have been 1 day short of her study drug.  It was decided to dispense her study drug today for cycle 38 1 day early.  The pt will be seen by Dr. Jana Hakim tomorrow for her labs and H/P.  Dr. Jana Hakim will also review her CT chest for evidence of progression.  The pt returned her cycle 37 everolimus study drug boxes. She confirmed that she took her everolimus (10 mg) every day along with letrozole (2.5 mg) from 07/14/14 - 08/10/14 (28 days). The pt's everolimus drug boxes had 0 remaining (unopened) blister packs. The pt was dispensed 56 pills - 56 doses taken (28 days x 2 doses) = 0 returned. Therefore, the pt is 100% compliant with her study drug, everolimus, for cycle 37. The pt's everolimus was taken to the pharmacy for the drug accountability check and storage.  The pt was dispensed her next 2 everolimus study drug boxes for self administration.  The pt requested that her lab appt be moved to 10am since she has to fast prior to her labs.    08/12/14 at 2:46pm - [employer], Bolero 4, cycle 38, day 1 study notes- The pt was into the cancer center this am for her fasting labs and cycle 38, day 1 assessments. The pt confirmed that she was fasting upon arrival to the cancer center for her labs. The pt was seen and examined today by Dr. Jana Hakim. Dr. Jana Hakim reviewed the pt's labs today, and he felt that her abnormal lab values were "not clinically significant".  Her glucose was within normal limits today. The pt's vitals were stable, and her mean BP was obtained for study purposes. Dr. Jana Hakim reviewed the pt's scans, and he stated that her scans are " still good," and there  is no definitive evidence to suggest "progression" . Dr. Jana Hakim is aware that the pt's RECIST measurements point to "progressive disease" because there is a 20% increase in her measurements from her nadir in June 2015. The [employer] team was notified that Dr. Jana Hakim wants to continue treating the pt on her current treatment. Dr. Valeta Harms, Medical Monitor, agreed to allow the pt to remain on her current treatment and re-evaluate her disease response at her next scheduled scan in 4 weeks.   The pt was in agreement to get her scans done in 4 weeks and then get back on her regular every 8 week scan schedule. The pt reviewed her scans with Dr. Jana Hakim, and she was in agreement to continue her current treatment plan. The pt denies any new adverse events. She reports the following AE's as ongoing: hot flashes, memory impairment, intermittent nausea, arthritis in hands, fatigue, back pain, and bilateral knee pain. The pt denies any new medications. She reports the following concomitant medications as ongoing: Lopressor, compazine prn, immodium prn, lopid, ibuprofen prn, and vitamin D3. She is performing all of her usual activities with some limitations. ECOG=1. The pt said she walks around [employer] for exercise because the temperatures as so cold to walk outdoors now. The pt's lipid panel is pending. The pt is aware of her February and March appointments.  08/15/14 at 9:31am - The pt's lipid panel is stable.

## 2014-08-12 ENCOUNTER — Other Ambulatory Visit (HOSPITAL_BASED_OUTPATIENT_CLINIC_OR_DEPARTMENT_OTHER): Payer: Medicare Other

## 2014-08-12 ENCOUNTER — Telehealth: Payer: Self-pay | Admitting: Oncology

## 2014-08-12 ENCOUNTER — Other Ambulatory Visit: Payer: Self-pay | Admitting: *Deleted

## 2014-08-12 ENCOUNTER — Other Ambulatory Visit: Payer: Medicare Other

## 2014-08-12 ENCOUNTER — Ambulatory Visit (HOSPITAL_BASED_OUTPATIENT_CLINIC_OR_DEPARTMENT_OTHER): Payer: Medicare Other | Admitting: Oncology

## 2014-08-12 ENCOUNTER — Other Ambulatory Visit (HOSPITAL_COMMUNITY)
Admission: RE | Admit: 2014-08-12 | Discharge: 2014-08-12 | Disposition: A | Discharge status: Home / Self Care | Payer: Medicare Other | Source: Ambulatory Visit | Attending: Oncology | Admitting: Oncology

## 2014-08-12 VITALS — BP 108/58 | HR 86 | Temp 98.3°F | Resp 18 | Ht 65.0 in | Wt 170.2 lb

## 2014-08-12 DIAGNOSIS — C7951 Secondary malignant neoplasm of bone: Secondary | ICD-10-CM | POA: Diagnosis not present

## 2014-08-12 DIAGNOSIS — C50919 Malignant neoplasm of unspecified site of unspecified female breast: Secondary | ICD-10-CM

## 2014-08-12 DIAGNOSIS — C7800 Secondary malignant neoplasm of unspecified lung: Principal | ICD-10-CM

## 2014-08-12 DIAGNOSIS — C7931 Secondary malignant neoplasm of brain: Secondary | ICD-10-CM

## 2014-08-12 DIAGNOSIS — C7801 Secondary malignant neoplasm of right lung: Secondary | ICD-10-CM

## 2014-08-12 DIAGNOSIS — C50412 Malignant neoplasm of upper-outer quadrant of left female breast: Secondary | ICD-10-CM

## 2014-08-12 DIAGNOSIS — Z170 Estrogen receptor positive status [ER+]: Secondary | ICD-10-CM | POA: Diagnosis not present

## 2014-08-12 DIAGNOSIS — C50911 Malignant neoplasm of unspecified site of right female breast: Secondary | ICD-10-CM

## 2014-08-12 LAB — CK: CK TOTAL: 63 U/L (ref 7–177)

## 2014-08-12 LAB — COMPREHENSIVE METABOLIC PANEL (CC13)
ALBUMIN: 3.6 g/dL (ref 3.5–5.0)
ALT: 20 U/L (ref 0–55)
AST: 22 U/L (ref 5–34)
Alkaline Phosphatase: 199 U/L — ABNORMAL HIGH (ref 40–150)
Anion Gap: 11 mEq/L (ref 3–11)
BUN: 21.8 mg/dL (ref 7.0–26.0)
CO2: 17 mEq/L — ABNORMAL LOW (ref 22–29)
Calcium: 9.8 mg/dL (ref 8.4–10.4)
Chloride: 110 mEq/L — ABNORMAL HIGH (ref 98–109)
Creatinine: 1 mg/dL (ref 0.6–1.1)
EGFR: 63 mL/min/{1.73_m2} — AB (ref >90)
Glucose: 132 mg/dl (ref 70–140)
Potassium: 3.9 mEq/L (ref 3.5–5.1)
Sodium: 138 mEq/L (ref 136–145)
TOTAL PROTEIN: 7.9 g/dL (ref 6.4–8.3)
Total Bilirubin: 0.27 mg/dL (ref 0.20–1.20)

## 2014-08-12 LAB — CBC WITH DIFFERENTIAL/PLATELET
BASO%: 0.6 % (ref 0.0–2.0)
BASOS ABS: 0 10*3/uL (ref 0.0–0.1)
EOS ABS: 0.1 10*3/uL (ref 0.0–0.5)
EOS%: 1.9 % (ref 0.0–7.0)
HCT: 41.2 % (ref 34.8–46.6)
HGB: 13.5 g/dL (ref 11.6–15.9)
LYMPH%: 18.5 % (ref 14.0–49.7)
MCH: 25.7 pg (ref 25.1–34.0)
MCHC: 32.8 g/dL (ref 31.5–36.0)
MCV: 78.5 fL — ABNORMAL LOW (ref 79.5–101.0)
MONO#: 0.6 10*3/uL (ref 0.1–0.9)
MONO%: 10.4 % (ref 0.0–14.0)
NEUT#: 4.2 10*3/uL (ref 1.5–6.5)
NEUT%: 68.6 % (ref 38.4–76.8)
NRBC: 0 % (ref 0–0)
Platelets: 360 10*3/uL (ref 145–400)
RBC: 5.25 10*6/uL (ref 3.70–5.45)
RDW: 14.2 % (ref 11.2–14.5)
WBC: 6.2 10*3/uL (ref 3.9–10.3)
lymph#: 1.1 10*3/uL (ref 0.9–3.3)

## 2014-08-12 LAB — PHOSPHORUS: PHOSPHORUS: 3 mg/dL (ref 2.3–4.6)

## 2014-08-12 LAB — LIPID PANEL
Cholesterol: 229 mg/dL — ABNORMAL HIGH (ref 0–200)
HDL: 43 mg/dL (ref >39)
LDL CALC: 127 mg/dL — AB (ref 0–99)
TRIGLYCERIDES: 294 mg/dL — AB (ref <150)
Total CHOL/HDL Ratio: 5.3 Ratio
VLDL: 59 mg/dL — ABNORMAL HIGH (ref 0–40)

## 2014-08-12 LAB — BILIRUBIN, DIRECT: BILIRUBIN DIRECT: 0.1 mg/dL (ref 0.0–0.3)

## 2014-08-12 LAB — URIC ACID (CC13): Uric Acid, Serum: 3.7 mg/dl (ref 2.6–7.4)

## 2014-08-12 NOTE — Progress Notes (Signed)
[facility]  [phone] Fax:([phone]) [phone]     ID: Barbara Parks DOB: [birth year]  MR#: [id number]  MBE#:[id number]  Patient Care Team: Marjorie Smolder, MD as PCP - General (Family Medicine) Candee Furbish, MD (Cardiology) Eston Esters, MD (Hematology and Oncology) Gaye Pollack, MD (Cardiothoracic Surgery)  OTHER MD: Dr Erroll Luna- Merlene Laughter, DDS  CHIEF COMPLAINT: metastatic breast cancer, on BOLERO study  CURRENT TREATMENT: letrozole + everolimus  BREAST CANCER HISTORY: From Dr. Collier Barbara Parks's original intake note 04/13/2004:  "This woman has been in good health all of her life. She recently moved from [place] to work here.  She palpated a mass at about the 12 o'clock position in mid-July. She has not noticed any nipple retraction or skin changes.  She was seen by her primary care doctor who subsequently referred her for a mammogram.  Mammogram was performed on 03/01/04. This demonstrated a spiculated 2 cm mass at the 12 o'clock position in the right breast.  Upper outer quadrant of the left breast shows some distortion.   Physical exam at that time showed a firm, nontender nodule at the 12 o'clock position in the right breast, 5 cm from the nipple.  Ultrasound of this area showed a hypoechoic ill-defined mass, measuring 2.2 x 1.3 x 1.4 cm.  Physical exam of the left breast showed general vague thickening, upper outer quadrant of the left breast with a discrete palpable mass. The ultrasound performed showed a single hypoechoic ill-defined nodule at the 1 o'clock position, measuring 7 x 5 x 6 mm.  She had biopsies of both lesions on 03/02/03.  Needle core biopsy of the lesion on the right breast revealed invasive mammary carcinoma.  Needle core biopsy of the left breast showed a complex fibroadenoma.  Prognostic panel of the lesion on the right breast showed it to be ER positive at 73%, PR positive at 90% and proliferative index 9%, HER-2 was 1+.  Patient was  referred to Dr. Annamaria Boots, who performed a simple mastectomy with sentinel lymph node evaluation on 03/22/04.  Final pathology showed this to be an invasive ductal carcinoma  with lobular features, measuring 2.2 cm, grade 2 of 3.  Margins negative for carcinoma.  Invasive ductal carcinoma was extended to involve deep dermis of the nipple.  Lymphovascular invasion was identified.  Total of 4 sentinel lymph nodes were evaluated.  Touch imprints at the time of the OR was felt to be negative.  Subsequent evaluation showed a 5 mm focus of metastatic carcinoma in one of the four lymph nodes on microscopic after sectioning.  There was extracapsular extension  of one lymph node as well. The remaining three lymph nodes were all negative."  The patient's subsequent history is detailed below.  INTERVAL HISTORY: [name] returns today for follow up of her metastatic breast cancer her research nurse Ms. Lilla Shook is also present. Vester enjoyed the holidays, which is spent with family up [place]. She gained 3 pounds and then managed to work them off. She continues on exemestane and letrozole. She denies significant problems with hot flashes, vaginal dryness, arthralgias, or myalgias. She has not had any mouth sores or sore throat. There is no pleurisy or worsening shortness of breath. She is enrolled in the [place] trial. because her last CT scan suggested disease progression, although very minimally, a repeat CT scan was just obtained. This shows absolutely no change as compared to a month ago.  REVIEW OF SYSTEMS: Alencia continues to have left hip pain. This  is aggravated by cold weather. It is not more intense or persistent than before. She denies chest pain or pressure. She has a minimal dry cough, which is unchanged. A detailed review of systems today was otherwise noncontributory A detailed review of systems today was otherwise entirely stable.  PAST MEDICAL HISTORY: Past Medical History  Diagnosis Date  . Asthma   .  Shortness of breath   . Arthritis     left hip  . Contact dermatitis     from a bandaid that had Latex   . Osteopenia due to cancer therapy 09/09/2013  . Hypercholesteremia   . ADHD (attention deficit hyperactivity disorder)   . breast ca 2005    breast/chemo R mastectomy  . Metastasis to lung dx'd 08/2011    PAST SURGICAL HISTORY: Past Surgical History  Procedure Laterality Date  . Spine surgery  1996  . Breast surgery  2005    right  . Video bronchoscopy  09/11/2011    Procedure: VIDEO BRONCHOSCOPY;  Surgeon: Gaye Pollack, MD;  Location: [facility];  Service: Thoracic;  Laterality: N/A;  . Chest tube insertion  09/11/2011    Procedure: INSERTION PLEURAL DRAINAGE CATHETER;  Surgeon: Gaye Pollack, MD;  Location: [facility];  Service: Thoracic;  Laterality: Right;  . Pericardial window  09/11/2011    Procedure: PERICARDIAL WINDOW;  Surgeon: Gaye Pollack, MD;  Location: [facility];  Service: Thoracic;  Laterality: N/A;  . Removal of pleural drainage catheter  12/19/2011    Procedure: REMOVAL OF PLEURAL DRAINAGE CATHETER;  Surgeon: Gaye Pollack, MD;  Location: [facility];  Service: Thoracic;  Laterality: Right;  TO BE DONE IN MINOR ROOM, SHORT STAY  . Cataract extraction w/phaco Left 01/18/2014    Procedure: CATARACT EXTRACTION PHACO AND INTRAOCULAR LENS PLACEMENT (IOC);  Surgeon: Elta Guadeloupe T. Gershon Crane, MD;  Location: [facility];  Service: Ophthalmology;  Laterality: Left;  CDE 15.79  . Cataract extraction w/phaco Right 02/08/2014    Procedure: CATARACT EXTRACTION PHACO AND INTRAOCULAR LENS PLACEMENT (IOC);  Surgeon: Elta Guadeloupe T. Gershon Crane, MD;  Location: [facility];  Service: Ophthalmology;  Laterality: Right;  CDE 4.41    FAMILY HISTORY Family History  Problem Relation Age of Onset  . Cancer Mother     breast  . Heart disease Father   . Anesthesia problems Neg Hx    the patient's mother was diagnosed with breast cancer at age 64, she died age 64 from congestive heart failure..The patient's father died from heart  disease at age 87.  She has one sister alive & well.  Two brothers alive & well, one with diabetes. The patient's sister was also diagnosed with breast cancer, and was tested for the BRCA gene, and was negative. The patient herself has not been tested. There is no history of ovarian cancer in the family  GYNECOLOGIC HISTORY:  No LMP recorded. Patient is postmenopausal. Menarche age 16, the patient is GX P0. She stopped having periods with chemotherapy in 2005. 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 She was a Archivist. She is single, lives alone with her Shitzu-poodle Sammie.. Family is all in the [place] area.     ADVANCED DIRECTIVES: Not in place. At the 02/23/2014 visit the patient was given the appropriate documents to complete and notarize at her discretion. She tells me she is planning to name her sister, Billie Ruddy, as  healthcare power of attorney. [name] can be reached at [phone]   HEALTH MAINTENANCE: History  Substance Use Topics  . Smoking status: Former Smoker -- 1.50 packs/day for 30 years    Types: Cigarettes    Quit date: 09/10/2007  . Smokeless tobacco: Not on file  . Alcohol Use: No     Colonoscopy:  PAP:  Bone density: 08/11/2013, showed osteopenia  Lipid panel:  Allergies  Allergen Reactions  . Aspirin     REACTION: upset stomach  . Latex Other (See Comments)    Blistering and skin peels off  . Nsaids Nausea And Vomiting    Extreme nausea and vomiting    Current Outpatient Prescriptions  Medication Sig Dispense Refill  . cholecalciferol (VITAMIN D) 1000 UNITS tablet Take 2,000 Units by mouth daily.     Marland Kitchen gemfibrozil (LOPID) 600 MG tablet TAKE ONE TABLET BY MOUTH TWICE DAILY BEFORE A  MEAL 60 tablet 2  . ibuprofen (ADVIL,MOTRIN) 200 MG tablet Take 400 mg by mouth every 6 (six) hours as needed. For pain    . Investigational  everolimus (RAD001) 5 MG tablet [employer] [id number] Take 2 tablets by mouth daily. Take with a glass of water. 70 tablet 0  . Investigational everolimus (RAD001) 5 MG tablet [employer] [id number] Take 2 tablets by mouth daily. Take with a glass of water. 70 tablet 0  . Investigational everolimus (RAD001) 5 MG tablet [employer] [id number] Take 2 tablets by mouth daily. Take with a glass of water. 70 tablet 0  . Investigational everolimus (RAD001) 5 MG tablet [employer] [id number] Take 2 tablets by mouth daily. Take with a glass of water. 70 tablet 0  . Investigational everolimus (RAD001) 5 MG tablet [employer] [id number] Take 2 tablets by mouth daily. Take with a glass of water. 70 tablet 0  . letrozole (FEMARA) 2.5 MG tablet Take 1 tablet (2.5 mg total) by mouth daily. 30 tablet 0  . loperamide (IMODIUM) 2 MG capsule Take 2 mg by mouth 4 (four) times daily as needed for diarrhea or loose stools.    Marland Kitchen LORazepam (ATIVAN) 0.5 MG tablet Take 1 tablet (0.5 mg total) by mouth every 6 (six) hours as needed (Nausea or vomiting). 30 tablet 0  . metoprolol tartrate (LOPRESSOR) 25 MG tablet Take 0.5 tablets (12.5 mg total) by mouth daily. 30 tablet 4  . omeprazole (PRILOSEC) 40 MG capsule Take 1 capsule (40 mg total) by mouth daily. 90 capsule 3  . prochlorperazine (COMPAZINE) 10 MG tablet Take 1 tablet (10 mg total) by mouth every 6 (six) hours as needed (Nausea or vomiting). 30 tablet 1   No current facility-administered medications for this visit.    OBJECTIVE: Middle-aged white woman in no acute distress Filed Vitals:   08/12/14 1146  BP: 108/58  Pulse: 86  Temp:   Resp:      Body mass index is 28.32 kg/(m^2).    ECOG FS:1 - Symptomatic but completely ambulatory   Sclerae unicteric, pupils round and equal Oropharynx clear, dentition in good repair No cervical or supraclavicular adenopathy Lungs no rales or rhonchi, no wheezes appreciated Heart regular rate and rhythm Abd soft,  nontender, positive bowel sounds MSK no focal spinal tenderness, no upper extremity lymphedema Neuro: nonfocal, well oriented, positive affect Breasts: Deferred  LAB RESULTS:  CMP     Component Value Date/Time   NA 138 08/12/2014 1038   NA 136 07/16/2012 0910   K 3.9 08/12/2014 1038   K 4.0 07/16/2012 0910   CL 109*  12/31/2012 0940   CL 103 07/16/2012 0910   CO2 17* 08/12/2014 1038   CO2 21 07/16/2012 0910   GLUCOSE 132 08/12/2014 1038   GLUCOSE 127* 12/31/2012 0940   GLUCOSE 140* 07/16/2012 0910   BUN 21.8 08/12/2014 1038   BUN 16 07/16/2012 0910   CREATININE 1.0 08/12/2014 1038   CREATININE 0.75 07/16/2012 0910   CALCIUM 9.8 08/12/2014 1038   CALCIUM 10.1 07/16/2012 0910   PROT 7.9 08/12/2014 1038   PROT 8.0 07/16/2012 0910   ALBUMIN 3.6 08/12/2014 1038   ALBUMIN 3.7 07/16/2012 0910   AST 22 08/12/2014 1038   AST 30 07/16/2012 0910   ALT 20 08/12/2014 1038   ALT 33 07/16/2012 0910   ALKPHOS 199* 08/12/2014 1038   ALKPHOS 142* 07/16/2012 0910   BILITOT 0.27 08/12/2014 1038   BILITOT 0.2* 07/16/2012 0910   GFRNONAA >90 09/13/2011 0440   GFRAA >90 09/13/2011 0440    I No results found for: SPEP  Lab Results  Component Value Date   WBC 6.2 08/12/2014   NEUTROABS 4.2 08/12/2014   HGB 13.5 08/12/2014   HCT 41.2 08/12/2014   MCV 78.5* 08/12/2014   PLT 360 08/12/2014      Chemistry      Component Value Date/Time   NA 138 08/12/2014 1038   NA 136 07/16/2012 0910   K 3.9 08/12/2014 1038   K 4.0 07/16/2012 0910   CL 109* 12/31/2012 0940   CL 103 07/16/2012 0910   CO2 17* 08/12/2014 1038   CO2 21 07/16/2012 0910   BUN 21.8 08/12/2014 1038   BUN 16 07/16/2012 0910   CREATININE 1.0 08/12/2014 1038   CREATININE 0.75 07/16/2012 0910      Component Value Date/Time   CALCIUM 9.8 08/12/2014 1038   CALCIUM 10.1 07/16/2012 0910   ALKPHOS 199* 08/12/2014 1038   ALKPHOS 142* 07/16/2012 0910   AST 22 08/12/2014 1038   AST 30 07/16/2012 0910   ALT 20 08/12/2014  1038   ALT 33 07/16/2012 0910   BILITOT 0.27 08/12/2014 1038   BILITOT 0.2* 07/16/2012 0910       Lab Results  Component Value Date   LABCA2 58* 09/14/2012    No components found for: [id number]  No results for input(s): INR in the last 168 hours.  Urinalysis    Component Value Date/Time   COLORURINE YELLOW 09/11/2011 0819   APPEARANCEUR CLEAR 09/11/2011 0819   LABSPEC 1.025 07/14/2014 0913   LABSPEC 1.013 09/11/2011 0819   PHURINE 6.5 09/11/2011 0819   GLUCOSEU Negative 07/14/2014 0913   GLUCOSEU NEGATIVE 09/11/2011 0819   HGBUR SMALL* 09/11/2011 0819   BILIRUBINUR NEGATIVE 09/11/2011 0819   KETONESUR 15* 09/11/2011 0819   PROTEINUR NEGATIVE 09/11/2011 0819   UROBILINOGEN 0.2 07/14/2014 0913   UROBILINOGEN 0.2 09/11/2011 0819   NITRITE NEGATIVE 09/11/2011 0819   LEUKOCYTESUR NEGATIVE 09/11/2011 0819    STUDIES: Ct Chest W Contrast  08/11/2014   CLINICAL DATA:  Patient with history of metastatic breast cancer.  EXAM: CT CHEST WITH CONTRAST  TECHNIQUE: Multidetector CT imaging of the chest was performed during intravenous contrast administration.  CONTRAST:  36mL OMNIPAQUE IOHEXOL 300 MG/ML  SOLN  COMPARISON:  CT 07/12/2014  FINDINGS: RECIST 1.1  Target Lesions:  1. Right lower lobe pulmonary nodule measuring 6 mm (image 43; series 5), previously 6 mm. 2. Partially calcified subcarinal lymph node measuring 6 mm (image 31; series 2, previously 6 mm. 3. Right hilar lymph node measuring 9 mm (image 212;  series 3), previously 9 mm. Non-target Lesions:  1. None CT chest:  Visualized thyroid is unremarkable. Postsurgical change compatible with right mastectomy. Normal heart size. No pericardial effusion. Aorta and main pulmonary artery normal in caliber. Unchanged 9 mm right hilar lymph node. Additionally 6 mm partially calcified subcarinal lymph node is unchanged.  Central airways are patent. Stable subpleural 6 mm right lower lobe pulmonary nodule (image 43; series 5). Left lung is  clear. Trace right pleural fluid versus thickening.  Visualization of the upper abdomen demonstrates unchanged non mass like hyper attenuation within the right hepatic lobe, this is stable from prior. Unchanged minimal nodularity left adrenal gland. No aggressive or acute appearing osseous lesions.  IMPRESSION: Right mastectomy and axillary lymph node dissection.  Stable right lower lobe pulmonary nodule.   Electronically Signed   By: Annia Belt M.D.   On: 08/11/2014 15:18      ASSESSMENT: 64 y.o. Barbara Parks, [place] woman with stage IV breast cancer, on BOLERO-4 trial  (1) status post right mastectomy and sentinel lymph node sampling 03/22/2004 for a pT2 pN1, stage IIB invasive ductal carcinoma with lobular features, grade 2, estrogen receptor and progesterone receptor positive, HER-2 negative, with an MIB-1 of 9% ([id number] and [id number])  (2) addition all right axillary lymph node sampling 05/07/2004 showed 2 benign lymph nodes (4 lymph nodes previously removed, so total was one positive lymph node out of 6; [id number])  (3) the patient was evaluated by radiation oncology; no postmastectomy radiation recommended  (4) adjuvant chemotherapy with dose dense doxorubicin and cyclophosphamide x4 cycles (first cycle delayed one week) followed by dose dense paclitaxel x4 was completed 09/18/2004  (5) tamoxifen started March 2006, discontinued 2009  METASTATIC DISEASE: (6) presenting with a large pericardial effusion, large right pleural effusion and possible right middle lobe bronchial obstruction February 2013, status post pericardial window placement, fiberoptic bronchoscopy and right Pleurx placement 09/11/2011, with biopsy of the bronchus intermedius and pericardium positive for metastatic breast cancer, estrogen receptor 91% positive with moderate staining intensity, progesterone receptor 100% positive with strong staining intensity, with an MIB-1 of 35%, and no HER-2 amplification, the signals ratio being 1.37  ([id number])  (7) enrolled in BOLERO-4 trial 10/10/2011, receiving letrozole and everolimus  (a) two small areas of enhancement in the cerebellum noted by brain MRI 10/03/2011 were no longer apparent on repeat MRI 08/21/2012-- most recent brain MRI 04/01/2014 showed no evidence of intracranial metastatic disease  (b) sclerotic lesions in left iliac bone and sacrum have not been biopsied; stable; plan is to start zolendronate after patient updates her dental care (extraction planned)  (c) RLL lung nodule, stable (rescanned 07/12/2014 and 08/11/2014) R hilar and subcarinal lymph nodes: stable  (9) additional problems:  (a) hepatic steatosis  (b) COPD/ emphysema/ asthma  (c) advanced L hip osteoarthritis  (d) aortoiliac atherosclerosis  (e) dental evaluation pending w possible dental extractions  (f) possible thalassemia trait  (10) Bone density concerns: DEXA scan 08/11/2013 was normal   PLAN: Sylvan is is clinically very stable and the repeat CT scan shows no progression. The concern, of course, is that when one is following such small volume disease, a 1 or 2 mm change can be interpreted as a 20% change. We will need to continue to follow this very closely. Per protocol we are going to repeat the scan in another 4 weeks.  For now, however, we are continuing letrozole and everolimus, which she is tolerating well. Tamsin has a good understanding of the overall plan.  She agrees with it. She will call with any problems that may develop before her next visit here.  Chauncey Cruel, MD   08/12/2014 12:48 PM

## 2014-08-12 NOTE — Telephone Encounter (Signed)
Refill approved by Dr. Jana Hakim. Sent by collaborative nurse

## 2014-08-12 NOTE — Telephone Encounter (Signed)
, °

## 2014-08-13 DIAGNOSIS — Z170 Estrogen receptor positive status [ER+]: Secondary | ICD-10-CM

## 2014-08-13 DIAGNOSIS — C50411 Malignant neoplasm of upper-outer quadrant of right female breast: Secondary | ICD-10-CM | POA: Insufficient documentation

## 2014-08-13 DIAGNOSIS — I700 Atherosclerosis of aorta: Secondary | ICD-10-CM | POA: Insufficient documentation

## 2014-08-14 ENCOUNTER — Encounter: Payer: Self-pay | Admitting: Oncology

## 2014-09-07 ENCOUNTER — Encounter (HOSPITAL_COMMUNITY): Payer: Self-pay

## 2014-09-07 ENCOUNTER — Other Ambulatory Visit: Payer: Self-pay | Admitting: *Deleted

## 2014-09-07 ENCOUNTER — Ambulatory Visit (HOSPITAL_COMMUNITY)
Admission: RE | Admit: 2014-09-07 | Discharge: 2014-09-07 | Disposition: A | Discharge status: Home / Self Care | Payer: Medicare Other | Source: Ambulatory Visit | Attending: Oncology | Admitting: Oncology

## 2014-09-07 DIAGNOSIS — Z79899 Other long term (current) drug therapy: Secondary | ICD-10-CM | POA: Insufficient documentation

## 2014-09-07 DIAGNOSIS — C50911 Malignant neoplasm of unspecified site of right female breast: Secondary | ICD-10-CM | POA: Insufficient documentation

## 2014-09-07 DIAGNOSIS — Z08 Encounter for follow-up examination after completed treatment for malignant neoplasm: Secondary | ICD-10-CM | POA: Insufficient documentation

## 2014-09-07 DIAGNOSIS — C7931 Secondary malignant neoplasm of brain: Secondary | ICD-10-CM | POA: Insufficient documentation

## 2014-09-07 DIAGNOSIS — C7951 Secondary malignant neoplasm of bone: Secondary | ICD-10-CM | POA: Insufficient documentation

## 2014-09-07 DIAGNOSIS — K760 Fatty (change of) liver, not elsewhere classified: Secondary | ICD-10-CM | POA: Insufficient documentation

## 2014-09-07 DIAGNOSIS — C50919 Malignant neoplasm of unspecified site of unspecified female breast: Secondary | ICD-10-CM

## 2014-09-07 DIAGNOSIS — Z9011 Acquired absence of right breast and nipple: Secondary | ICD-10-CM | POA: Insufficient documentation

## 2014-09-07 DIAGNOSIS — C7800 Secondary malignant neoplasm of unspecified lung: Secondary | ICD-10-CM | POA: Insufficient documentation

## 2014-09-07 IMAGING — CT CT CHEST W/ CM
2 of 4 series · 16 of 46 positions shown, 18 images · IV contrast (OMNIPAQUE)
Comparison: 12/29/12.

CLINICAL DATA: Breast cancer

CT CHEST, ABDOMEN AND PELVIS WITH CONTRAST
TECHNIQUE: Multidetector CT imaging of the chest, abdomen and
pelvis was performed following the standard protocol during bolus
administration of intravenous contrast.
Contrast: 50mL OMNIPAQUE IOHEXOL 300 MG/ML  SOLN, 100mL OMNIPAQUE
IOHEXOL 300 MG/ML  SOLN

[Series 2: cap with st · axial · 0.73mm/px · z∈[-663,-88]mm · 13 of 125 slices shown, 15 images]
[im 5/125  soft-tissue]
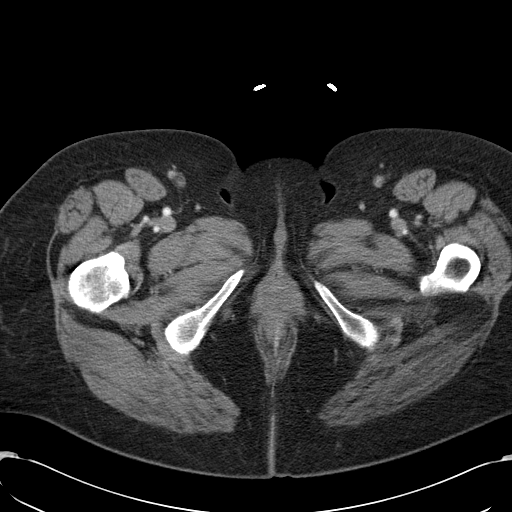
[im 5/125  bone]
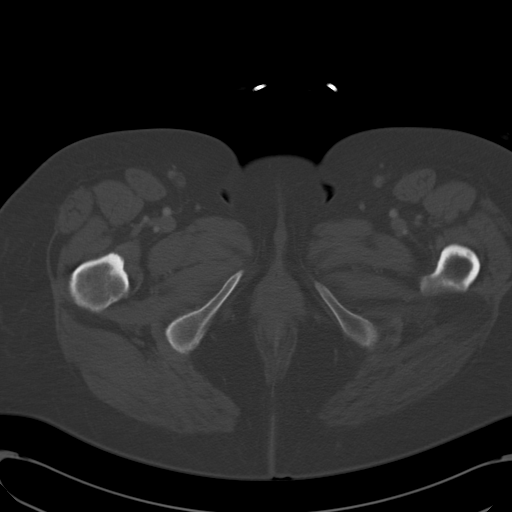
[im 15/125  soft-tissue]
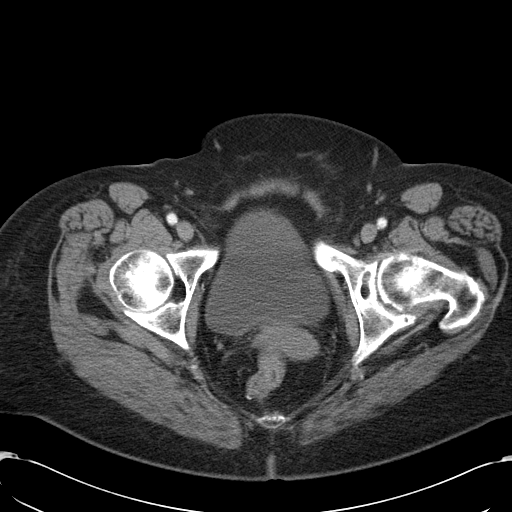
[im 25/125  soft-tissue]
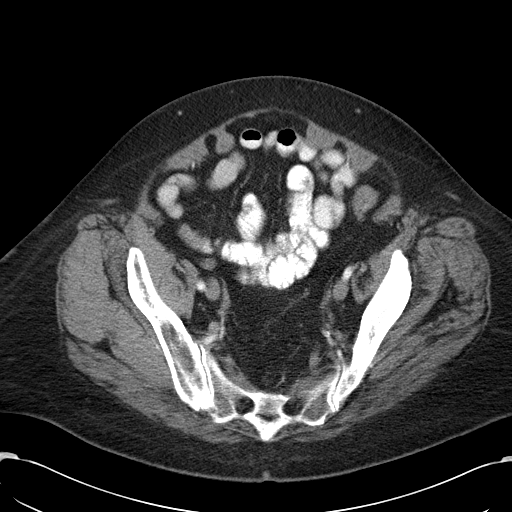
[im 35/125  soft-tissue]
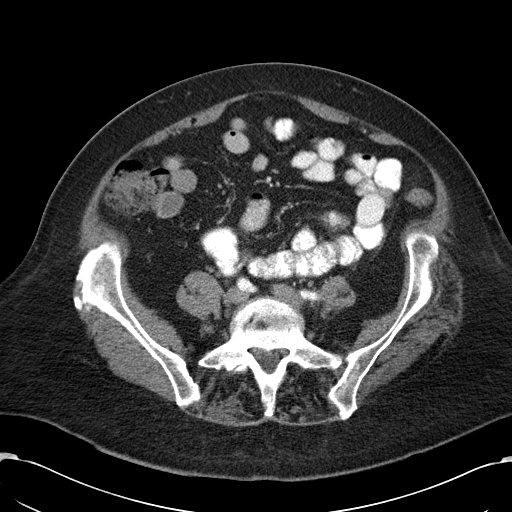
[im 45/125  soft-tissue]
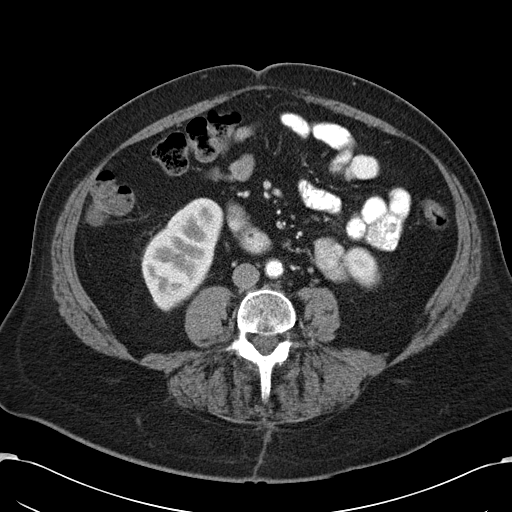
[im 55/125  soft-tissue]
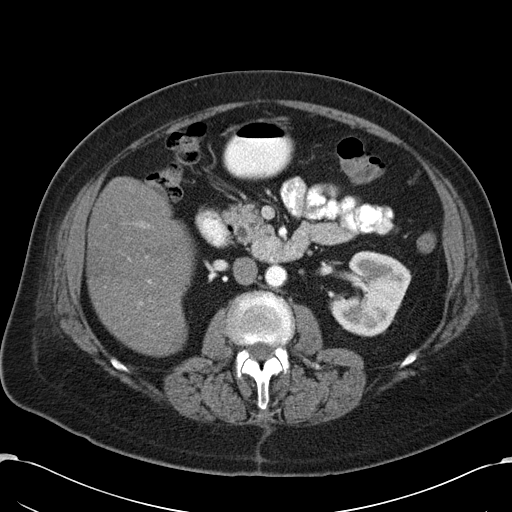
[im 65/125  soft-tissue]
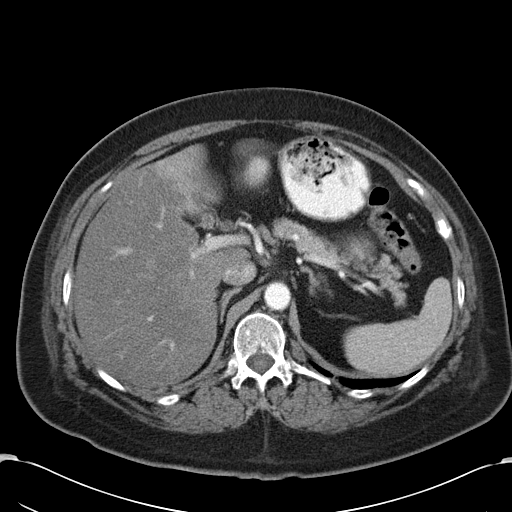
[im 70/125  soft-tissue]
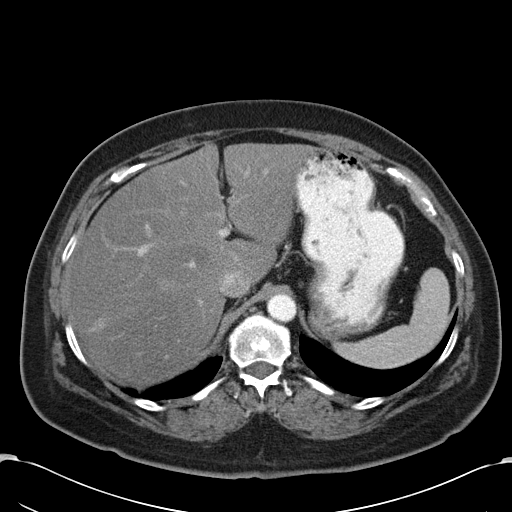
[im 80/125  soft-tissue]
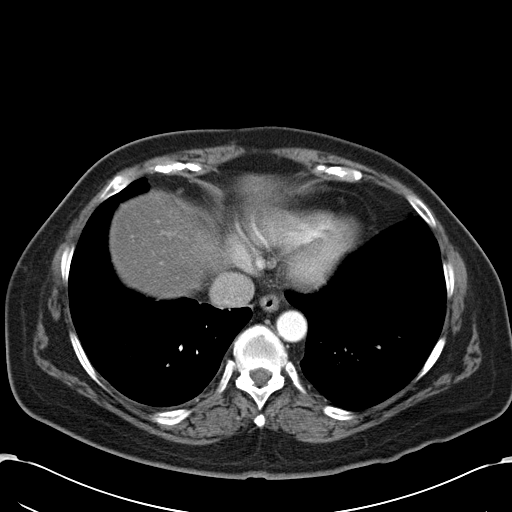
[im 80/125  bone]
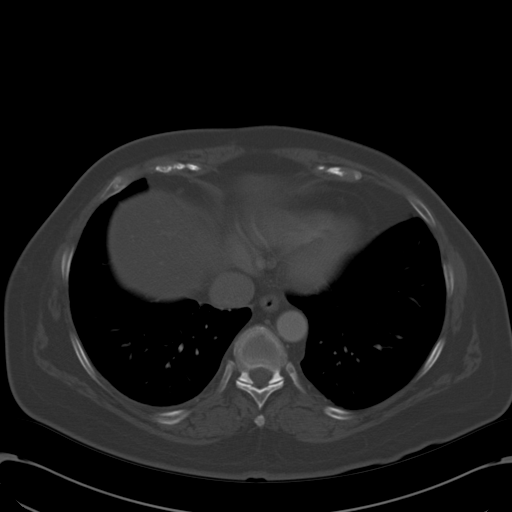
[im 90/125  soft-tissue]
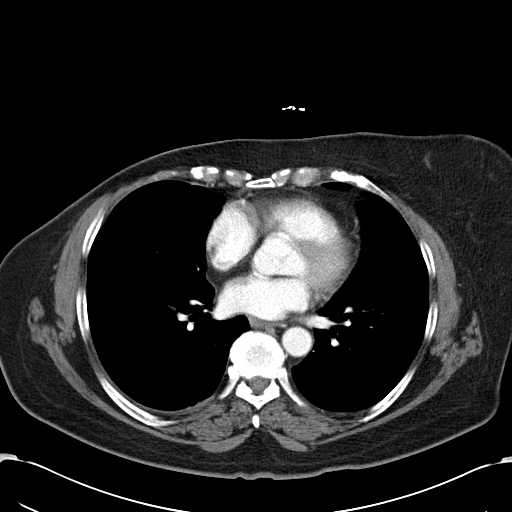
[im 100/125  soft-tissue]
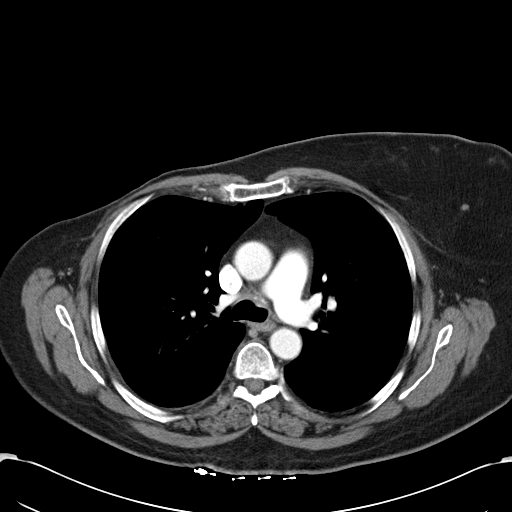
[im 110/125  soft-tissue]
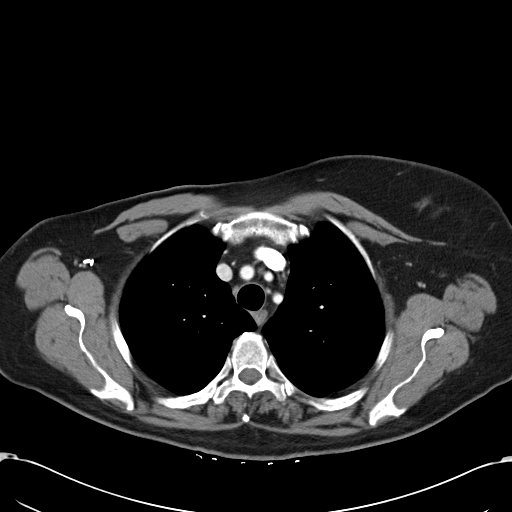
[im 120/125  soft-tissue]
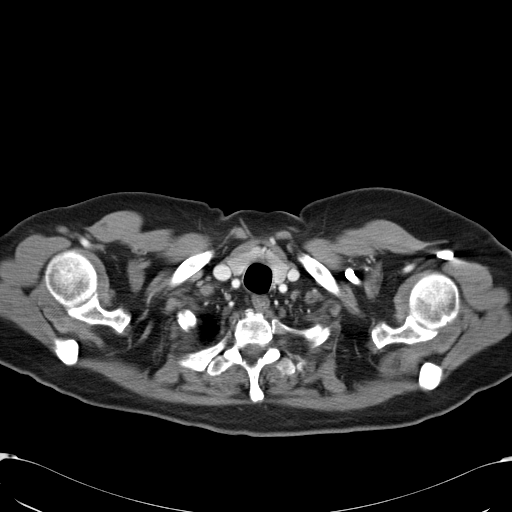

[Series 602: <mpr thick range> · coronal · 1.22mm/px · 3 of 106 slices shown]
[im 36/106  soft-tissue]
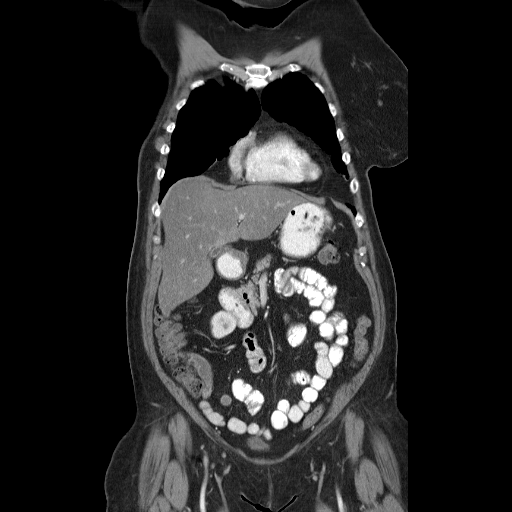
[im 47/106  soft-tissue]
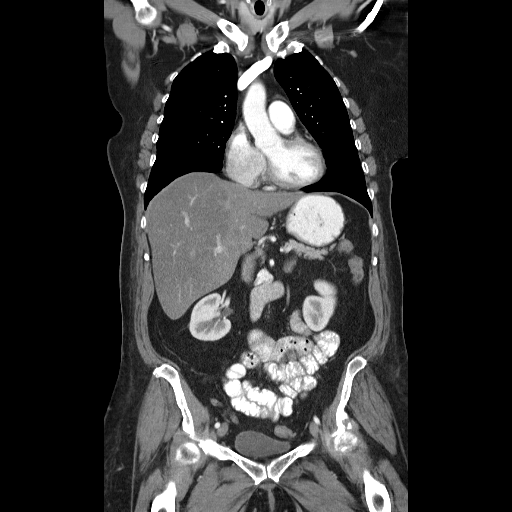
[im 59/106  soft-tissue]
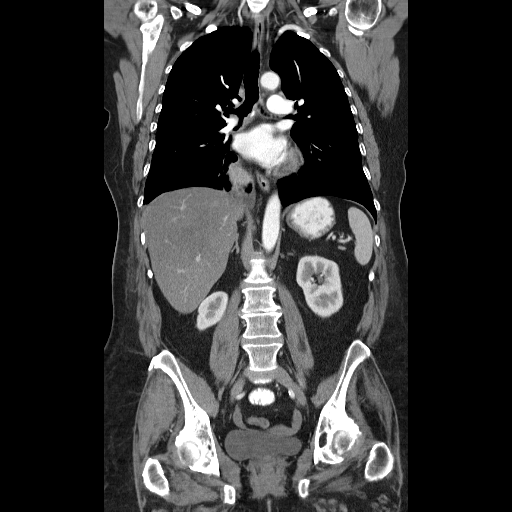

[16 of 46 positions shown; findings below may reference images not displayed]

RECIST protocol

Target Lesions:

1.Right lower lobe lung "nodule" 5 mm on image 30/series 5 versus
5mm on the prior.
2. Subcarinal lymph node 8 mm short axis on image 31/series 2
versus 9 mm on the prior.
3. Right hilar node 8mm on image 29/series 2 versus 9mm on
the prior exam.

Non Target lesions:  Findings suspicious for osseous metastasis.
Similar to previous exam.

CT CHEST
FINDINGS: There is no pleural effusion identified.  Stable 5 mm
nodule within the superior segment of the right lower lobe, image
30/series 5.  This no new or enlarging pulmonary nodules or masses
noted.

The heart size appears normal.  No pericardial effusion.  No
mediastinal or hilar adenopathy identified.

There is no axillary or supraclavicular adenopathy.  Previous right
mastectomy.

Review of the visualized osseous structures shows a focal area of
sclerotic metastasis involving the left posterior 11th rib, image
48/series 2.  This is similar to previous exam.  No new or
enlarging bone lesions identified.
IMPRESSION: 1.  Stable exam.
2.  Similar appearance of 1th posterior left rib metastasis.

CT ABDOMEN AND PELVIS
FINDINGS: Mild diffuse fatty infiltration throughout the liver is
identified.  Focal area of hyperattenuation within the lateral
right hepatic lobe is unchanged from previous exam, image 56/series
2.  The gallbladder is normal.  No biliary dilatation.  Normal
appearance of the pancreas.  The spleen is unremarkable.  The
adrenal glands are both normal.  Normal appearance of the right
kidney.  The left kidney is normal.  Urinary bladder is
unremarkable.  Uterus and adnexal structures are negative.

Calcified atherosclerotic disease affects the abdominal aorta and
its branches.  There is no pelvic or inguinal adenopathy
identified.

No free fluid or fluid collections within the abdomen or pelvis.
The stomach is normal.  The small bowel loops are unremarkable.
The appendix is visualized and appears normal.  Normal appearance
of the proximal colon.  There is multiple distal colonic
diverticula without acute inflammation.

Review of the visualized osseous structures is significant for
advanced changes of osteoarthritis involving the left hip. Subtle
area of asymmetric sclerosis involving the left sacral wing appears
similar to previous exam, image number 96/series 2.  Asymmetric
sclerosis involving the left iliac wing is also noted and is
unchanged from previous exam.
IMPRESSION: 1.  Similar appearance of bone metastasis involving the left side
of pelvis.
2.  Hepatic steatosis

## 2014-09-07 MED ORDER — IOHEXOL 300 MG/ML  SOLN
50.0000 mL | Freq: Once | INTRAMUSCULAR | Status: AC | PRN
  Administered 2014-09-07: 50 mL via ORAL

## 2014-09-07 MED ORDER — IOHEXOL 300 MG/ML  SOLN
100.0000 mL | Freq: Once | INTRAMUSCULAR | Status: AC | PRN
  Administered 2014-09-07: 100 mL via INTRAVENOUS

## 2014-09-08 ENCOUNTER — Other Ambulatory Visit: Payer: Self-pay | Admitting: *Deleted

## 2014-09-08 ENCOUNTER — Other Ambulatory Visit (HOSPITAL_BASED_OUTPATIENT_CLINIC_OR_DEPARTMENT_OTHER): Payer: Medicare Other

## 2014-09-08 ENCOUNTER — Other Ambulatory Visit: Payer: Self-pay | Admitting: Oncology

## 2014-09-08 ENCOUNTER — Other Ambulatory Visit (HOSPITAL_COMMUNITY)
Admission: RE | Admit: 2014-09-08 | Discharge: 2014-09-08 | Disposition: A | Discharge status: Home / Self Care | Payer: Medicare Other | Source: Ambulatory Visit | Attending: Oncology | Admitting: Oncology

## 2014-09-08 ENCOUNTER — Ambulatory Visit (HOSPITAL_BASED_OUTPATIENT_CLINIC_OR_DEPARTMENT_OTHER): Payer: Medicare Other | Admitting: Nurse Practitioner

## 2014-09-08 ENCOUNTER — Other Ambulatory Visit (HOSPITAL_BASED_OUTPATIENT_CLINIC_OR_DEPARTMENT_OTHER): Payer: Medicare Other | Admitting: *Deleted

## 2014-09-08 ENCOUNTER — Encounter: Payer: Self-pay | Admitting: Nurse Practitioner

## 2014-09-08 ENCOUNTER — Encounter: Payer: Self-pay | Admitting: *Deleted

## 2014-09-08 VITALS — BP 118/71 | HR 85 | Temp 97.3°F | Resp 18 | Ht 65.0 in | Wt 172.8 lb

## 2014-09-08 DIAGNOSIS — C7800 Secondary malignant neoplasm of unspecified lung: Principal | ICD-10-CM

## 2014-09-08 DIAGNOSIS — R300 Dysuria: Secondary | ICD-10-CM

## 2014-09-08 DIAGNOSIS — C50911 Malignant neoplasm of unspecified site of right female breast: Secondary | ICD-10-CM

## 2014-09-08 DIAGNOSIS — R319 Hematuria, unspecified: Secondary | ICD-10-CM

## 2014-09-08 DIAGNOSIS — Z006 Encounter for examination for normal comparison and control in clinical research program: Secondary | ICD-10-CM | POA: Diagnosis not present

## 2014-09-08 DIAGNOSIS — C50919 Malignant neoplasm of unspecified site of unspecified female breast: Secondary | ICD-10-CM | POA: Insufficient documentation

## 2014-09-08 DIAGNOSIS — R310 Gross hematuria: Secondary | ICD-10-CM | POA: Diagnosis not present

## 2014-09-08 DIAGNOSIS — C7801 Secondary malignant neoplasm of right lung: Secondary | ICD-10-CM

## 2014-09-08 LAB — COMPREHENSIVE METABOLIC PANEL (CC13)
ALBUMIN: 3.6 g/dL (ref 3.5–5.0)
ALK PHOS: 208 U/L — AB (ref 40–150)
ALT: 23 U/L (ref 0–55)
ANION GAP: 12 meq/L — AB (ref 3–11)
AST: 28 U/L (ref 5–34)
BUN: 17.1 mg/dL (ref 7.0–26.0)
CO2: 18 meq/L — AB (ref 22–29)
Calcium: 9.9 mg/dL (ref 8.4–10.4)
Chloride: 109 mEq/L (ref 98–109)
Creatinine: 0.9 mg/dL (ref 0.6–1.1)
EGFR: 67 mL/min/{1.73_m2} — AB (ref >90)
GLUCOSE: 151 mg/dL — AB (ref 70–140)
POTASSIUM: 3.9 meq/L (ref 3.5–5.1)
Sodium: 139 mEq/L (ref 136–145)
Total Bilirubin: 0.29 mg/dL (ref 0.20–1.20)
Total Protein: 7.9 g/dL (ref 6.4–8.3)

## 2014-09-08 LAB — CBC WITH DIFFERENTIAL/PLATELET
BASO%: 1 % (ref 0.0–2.0)
BASOS ABS: 0.1 10*3/uL (ref 0.0–0.1)
EOS%: 1.6 % (ref 0.0–7.0)
Eosinophils Absolute: 0.1 10*3/uL (ref 0.0–0.5)
HEMATOCRIT: 41.1 % (ref 34.8–46.6)
HGB: 13.8 g/dL (ref 11.6–15.9)
LYMPH%: 16.8 % (ref 14.0–49.7)
MCH: 26.4 pg (ref 25.1–34.0)
MCHC: 33.6 g/dL (ref 31.5–36.0)
MCV: 78.6 fL — ABNORMAL LOW (ref 79.5–101.0)
MONO#: 0.8 10*3/uL (ref 0.1–0.9)
MONO%: 13.2 % (ref 0.0–14.0)
NEUT#: 4.1 10*3/uL (ref 1.5–6.5)
NEUT%: 67.4 % (ref 38.4–76.8)
Platelets: 269 10*3/uL (ref 145–400)
RBC: 5.23 10*6/uL (ref 3.70–5.45)
RDW: 14.2 % (ref 11.2–14.5)
WBC: 6.1 10*3/uL (ref 3.9–10.3)
lymph#: 1 10*3/uL (ref 0.9–3.3)
nRBC: 0 % (ref 0–0)

## 2014-09-08 LAB — URINALYSIS, MICROSCOPIC - CHCC
Bilirubin (Urine): NEGATIVE
Glucose: NEGATIVE mg/dL
KETONES: NEGATIVE mg/dL
Leukocyte Esterase: NEGATIVE
Nitrite: NEGATIVE
PH: 6 (ref 4.6–8.0)
PROTEIN: 30 mg/dL
SPECIFIC GRAVITY, URINE: 1.02 (ref 1.003–1.035)
Urobilinogen, UR: 0.2 mg/dL (ref 0.2–1)

## 2014-09-08 LAB — LIPID PANEL
Cholesterol: 254 mg/dL — ABNORMAL HIGH (ref 0–200)
HDL: 51 mg/dL (ref >39)
LDL Cholesterol: 128 mg/dL — ABNORMAL HIGH (ref 0–99)
Total CHOL/HDL Ratio: 5 Ratio
Triglycerides: 375 mg/dL — ABNORMAL HIGH (ref <150)
VLDL: 75 mg/dL — ABNORMAL HIGH (ref 0–40)

## 2014-09-08 LAB — PHOSPHORUS: Phosphorus: 3.2 mg/dL (ref 2.3–4.6)

## 2014-09-08 LAB — URIC ACID (CC13): Uric Acid, Serum: 3.2 mg/dl (ref 2.6–7.4)

## 2014-09-08 LAB — CK: CK TOTAL: 63 U/L (ref 7–177)

## 2014-09-08 LAB — BILIRUBIN, DIRECT

## 2014-09-08 MED ORDER — INV-EVEROLIMUS (RAD0001) 5MG TABLET NOVARTIS CRAD001Y24135: 2.0000 | ORAL_TABLET | Freq: Every day | ORAL | Status: DC

## 2014-09-08 NOTE — Progress Notes (Signed)
09/08/14 at 10:04am- [employer]/ [facility] - cycle 39, day 1 study notes- The pt was into the cancer center this am for her fasting labs and cycle 39, day 1assessments. The pt confirmed that she was fasting upon arrival to the cancer center for her labs. The pt was seen and examined today by Dr. Virgie Dad NP, Susanne Borders. Heather reviewed the pt's labs today, and she felt that her abnormal lab values were "not clinically significant". Her glucose was slightly elevated today at 151. The pt's vitals and weight were stable, and her mean BP was obtained for study purposes. The NP reviewed the pt's scans, and the NP stated that her scans are " unchanged".Dr. Jana Hakim reviewed the RECIST table with the research nurse. The pt was pleased to hear that she can remain on her current treatment.  The pt reports seeing some blood in her urine on 09/07/14 with some dysuria.  The pt asked if she could have her urine checked for an infection. The pt said that she is seeing her urologist again on 09/15/14.  She reports the following AE's as ongoing: hot flashes, memory impairment, intermittent nausea, arthritis in hands, fatigue, back pain, and bilateral knee pain. She states she is getting over a "upper respiratory cold".  She said her "cold symptoms" started on 09/01/14, and she did not take anything for the cold.  The pt denies any new medications. She reports the following concomitant medications as ongoing: Lopressor, compazine prn, immodium prn, lopid, ibuprofen prn, and vitamin D3. She is performing all of her usual activities with some limitations. ECOG=1. The pt said that she overall feels good, but she is "heart-broken" that the [place] lost the [employer].  The pt's lipid panel is pending.  The pt returned her cycle 38 everolimus study drug boxes. She confirmed that she took her everolimus (10 mg) every day along with letrozole (2.5 mg) from 08/11/14 - 09/07/14 (28 days). The pt's everolimus drug boxes had 0  remaining (unopened) blister packs. The pt was dispensed 56 pills - 56 doses taken (28 days x 2 doses) = 0 returned. Therefore, the pt is 100% compliant with her study drug, everolimus, for cycle 38. The pt's everolimus was taken to the pharmacy for the drug accountability check and storage. The pt was dispensed her cycle 39 everolimus study drug boxes for self administration. The pt is aware of her March and April appointments.   Brion Aliment RN, BSN Clinical Research Nurse 09/08/2014 11:47 AM   09/08/14 at 2:29pm- The pt's lipid panel is stable.   Brion Aliment RN, BSN Clinical Research Nurse 09/08/2014 2:29 PM

## 2014-09-08 NOTE — Progress Notes (Signed)
[place]  Telephone:(336) [phone] Fax:(336) [phone]     ID: OVELLA MANYGOATS DOB: 13-Nov-1950  MR#: [id number]  HDQ#:[id number]  Patient Care Team: Marjorie Smolder, MD as PCP - General (Family Medicine) Candee Furbish, MD (Cardiology) Eston Esters, MD (Hematology and Oncology) Gaye Pollack, MD (Cardiothoracic Surgery)  OTHER MD: Dr Erroll Luna- Merlene Laughter, DDS  CHIEF COMPLAINT: metastatic breast cancer, on BOLERO study  CURRENT TREATMENT: letrozole + everolimus  BREAST CANCER HISTORY: From Dr. Collier [name]'s original intake note 04/13/2004:  "This woman has been in good health all of her life. She recently moved from [place] to work here.  She palpated a mass at about the 12 o'clock position in mid-July. She has not noticed any nipple retraction or skin changes.  She was seen by her primary care doctor who subsequently referred her for a mammogram.  Mammogram was performed on 03/01/04. This demonstrated a spiculated 2 cm mass at the 12 o'clock position in the right breast.  Upper outer quadrant of the left breast shows some distortion.   Physical exam at that time showed a firm, nontender nodule at the 12 o'clock position in the right breast, 5 cm from the nipple.  Ultrasound of this area showed a hypoechoic ill-defined mass, measuring 2.2 x 1.3 x 1.4 cm.  Physical exam of the left breast showed general vague thickening, upper outer quadrant of the left breast with a discrete palpable mass. The ultrasound performed showed a single hypoechoic ill-defined nodule at the 1 o'clock position, measuring 7 x 5 x 6 mm.  She had biopsies of both lesions on 03/02/03.  Needle core biopsy of the lesion on the right breast revealed invasive mammary carcinoma.  Needle core biopsy of the left breast showed a complex fibroadenoma.  Prognostic panel of the lesion on the right breast showed it to be ER positive at 73%, PR positive at 90% and proliferative index 9%, HER-2 was 1+.  Patient was  referred to Dr. Annamaria Boots, who performed a simple mastectomy with sentinel lymph node evaluation on 03/22/04.  Final pathology showed this to be an invasive ductal carcinoma  with lobular features, measuring 2.2 cm, grade 2 of 3.  Margins negative for carcinoma.  Invasive ductal carcinoma was extended to involve deep dermis of the nipple.  Lymphovascular invasion was identified.  Total of 4 sentinel lymph nodes were evaluated.  Touch imprints at the time of the OR was felt to be negative.  Subsequent evaluation showed a 5 mm focus of metastatic carcinoma in one of the four lymph nodes on microscopic after sectioning.  There was extracapsular extension  of one lymph node as well. The remaining three lymph nodes were all negative."  The patient's subsequent history is detailed below.  INTERVAL HISTORY: Zaide returns today for follow up of her metastatic breast cancer, accompanied by research nurse [name]. She continues on the [place] trial, currently on cycle 39. This consists of everolimus and letrozole. She tolerates both drugs well. She has hot flashes mainly at night, but denies vaginal changes. She also has chronic knee and left hip pain. The interval history is remarkable for gross hematuria for the first time yesterday as well as increase back pain and dysuria. A urinalysis in November showed proteinuria and microscopic hematuria and she has been followed up by [facility]. She claims a cystoscopy was suggested at the time.  She had a return visit with them scheduled 2 weeks ago, but inclement weather pushed this visit out to  next week.   REVIEW OF SYSTEMS: Kassidee denies fevers, chills, or changes in bowel habit. She has some mild nausea associated with the occurrence of this dysuria and back pain. She is getting over an upper respiratory infection and still has some sniffles and a slight cough. She is eating and drinking well. She denies headaches or dizziness. A detailed review of systems is  otherwise stable.  PAST MEDICAL HISTORY: Past Medical History  Diagnosis Date  . Asthma   . Shortness of breath   . Arthritis     left hip  . Contact dermatitis     from a bandaid that had Latex   . Osteopenia due to cancer therapy 09/09/2013  . Hypercholesteremia   . ADHD (attention deficit hyperactivity disorder)   . breast ca 2005    breast/chemo R mastectomy  . Metastasis to lung dx'd 08/2011    PAST SURGICAL HISTORY: Past Surgical History  Procedure Laterality Date  . Spine surgery  1996  . Breast surgery  2005    right  . Video bronchoscopy  09/11/2011    Procedure: VIDEO BRONCHOSCOPY;  Surgeon: Gaye Pollack, MD;  Location: [place];  Service: Thoracic;  Laterality: N/A;  . Chest tube insertion  09/11/2011    Procedure: INSERTION PLEURAL DRAINAGE CATHETER;  Surgeon: Gaye Pollack, MD;  Location: [place];  Service: Thoracic;  Laterality: Right;  . Pericardial window  09/11/2011    Procedure: PERICARDIAL WINDOW;  Surgeon: Gaye Pollack, MD;  Location: [facility];  Service: Thoracic;  Laterality: N/A;  . Removal of pleural drainage catheter  12/19/2011    Procedure: REMOVAL OF PLEURAL DRAINAGE CATHETER;  Surgeon: Gaye Pollack, MD;  Location: [place];  Service: Thoracic;  Laterality: Right;  TO BE DONE IN MINOR ROOM, SHORT STAY  . Cataract extraction w/phaco Left 01/18/2014    Procedure: CATARACT EXTRACTION PHACO AND INTRAOCULAR LENS PLACEMENT (IOC);  Surgeon: Elta Guadeloupe T. Gershon Crane, MD;  Location: [facility];  Service: Ophthalmology;  Laterality: Left;  CDE 15.79  . Cataract extraction w/phaco Right 02/08/2014    Procedure: CATARACT EXTRACTION PHACO AND INTRAOCULAR LENS PLACEMENT (IOC);  Surgeon: Elta Guadeloupe T. Gershon Crane, MD;  Location: [facility];  Service: Ophthalmology;  Laterality: Right;  CDE 4.41    FAMILY HISTORY Family History  Problem Relation Age of Onset  . Cancer Mother     breast  . Heart disease Father   . Anesthesia problems Neg Hx    the patient's mother was diagnosed with breast cancer at  age 75, she died age 58 from congestive heart failure..The patient's father died from heart disease at age 24.  She has one sister alive & well.  Two brothers alive & well, one with diabetes. The patient's sister was also diagnosed with breast cancer, and was tested for the BRCA gene, and was negative. The patient herself has not been tested. There is no history of ovarian cancer in the family  GYNECOLOGIC HISTORY:  No LMP recorded. Patient is postmenopausal. Menarche age 57, the patient is GX P0. She stopped having periods with chemotherapy in 2005. 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 She was a Archivist. She is single, lives alone with her Shitzu-poodle Sammie.. Family is all in the [place] area.     ADVANCED DIRECTIVES: Not in place. At the 02/23/2014 visit the patient was given the appropriate documents to  complete and notarize at her discretion. She tells me she is planning to name her sister, Billie Ruddy, as healthcare power of attorney. [name] can be reached at [phone]   HEALTH MAINTENANCE: History  Substance Use Topics  . Smoking status: Former Smoker -- 1.50 packs/day for 30 years    Types: Cigarettes    Quit date: 09/10/2007  . Smokeless tobacco: Not on file  . Alcohol Use: No     Colonoscopy:  PAP:  Bone density: 08/11/2013, showed osteopenia  Lipid panel:  Allergies  Allergen Reactions  . Aspirin     REACTION: upset stomach  . Latex Other (See Comments)    Blistering and skin peels off  . Nsaids Nausea And Vomiting    Extreme nausea and vomiting    Current Outpatient Prescriptions  Medication Sig Dispense Refill  . cholecalciferol (VITAMIN D) 1000 UNITS tablet Take 2,000 Units by mouth daily.     Marland Kitchen gemfibrozil (LOPID) 600 MG tablet TAKE ONE TABLET BY MOUTH TWICE DAILY BEFORE A  MEAL 60 tablet 2  . ibuprofen (ADVIL,MOTRIN) 200 MG  tablet Take 400 mg by mouth every 6 (six) hours as needed. For pain    . Investigational everolimus (RAD001) 5 MG tablet Novartis [id number] Take 2 tablets by mouth daily. Take with a glass of water. 70 tablet 0  . letrozole (FEMARA) 2.5 MG tablet Take 1 tablet (2.5 mg total) by mouth daily. 30 tablet 0  . loperamide (IMODIUM) 2 MG capsule Take 2 mg by mouth 4 (four) times daily as needed for diarrhea or loose stools.    Marland Kitchen LORazepam (ATIVAN) 0.5 MG tablet Take 1 tablet (0.5 mg total) by mouth every 6 (six) hours as needed (Nausea or vomiting). 30 tablet 0  . metoprolol tartrate (LOPRESSOR) 25 MG tablet Take 0.5 tablets (12.5 mg total) by mouth daily. 30 tablet 4  . omeprazole (PRILOSEC) 40 MG capsule Take 1 capsule (40 mg total) by mouth daily. 90 capsule 3  . prochlorperazine (COMPAZINE) 10 MG tablet Take 1 tablet (10 mg total) by mouth every 6 (six) hours as needed (Nausea or vomiting). 30 tablet 1   No current facility-administered medications for this visit.    OBJECTIVE: Middle-aged white woman in no acute distress. Ambulated into office with cane Filed Vitals:   09/08/14 0909  BP: 118/71  Pulse: 85  Temp:   Resp: 18     Body mass index is 28.76 kg/(m^2).    ECOG FS:1 - Symptomatic but completely ambulatory  Skin: warm, dry  HEENT: sclerae anicteric, conjunctivae pink, oropharynx clear. No thrush or mucositis.  Lymph Nodes: No cervical or supraclavicular lymphadenopathy  Lungs: clear to auscultation bilaterally, no rales, wheezes, or rhonci  Heart: regular rate and rhythm  Abdomen: round, soft, non tender, positive bowel sounds  Musculoskeletal: No focal spinal tenderness, no peripheral edema  Neuro: non focal, well oriented, positive affect  Breasts: deferred  LAB RESULTS:  CMP     Component Value Date/Time   NA 138 08/12/2014 1038   NA 136 07/16/2012 0910   K 3.9 08/12/2014 1038   K 4.0 07/16/2012 0910   CL 109* 12/31/2012 0940   CL 103 07/16/2012 0910   CO2 17*  08/12/2014 1038   CO2 21 07/16/2012 0910   GLUCOSE 132 08/12/2014 1038   GLUCOSE 127* 12/31/2012 0940   GLUCOSE 140* 07/16/2012 0910   BUN 21.8 08/12/2014 1038   BUN 16 07/16/2012 0910   CREATININE 1.0 08/12/2014 1038   CREATININE 0.75 07/16/2012  0910   CALCIUM 9.8 08/12/2014 1038   CALCIUM 10.1 07/16/2012 0910   PROT 7.9 08/12/2014 1038   PROT 8.0 07/16/2012 0910   ALBUMIN 3.6 08/12/2014 1038   ALBUMIN 3.7 07/16/2012 0910   AST 22 08/12/2014 1038   AST 30 07/16/2012 0910   ALT 20 08/12/2014 1038   ALT 33 07/16/2012 0910   ALKPHOS 199* 08/12/2014 1038   ALKPHOS 142* 07/16/2012 0910   BILITOT 0.27 08/12/2014 1038   BILITOT 0.2* 07/16/2012 0910   GFRNONAA >90 09/13/2011 0440   GFRAA >90 09/13/2011 0440    I No results found for: SPEP  Lab Results  Component Value Date   WBC 6.1 09/08/2014   NEUTROABS 4.1 09/08/2014   HGB 13.8 09/08/2014   HCT 41.1 09/08/2014   MCV 78.6* 09/08/2014   PLT 269 09/08/2014      Chemistry      Component Value Date/Time   NA 138 08/12/2014 1038   NA 136 07/16/2012 0910   K 3.9 08/12/2014 1038   K 4.0 07/16/2012 0910   CL 109* 12/31/2012 0940   CL 103 07/16/2012 0910   CO2 17* 08/12/2014 1038   CO2 21 07/16/2012 0910   BUN 21.8 08/12/2014 1038   BUN 16 07/16/2012 0910   CREATININE 1.0 08/12/2014 1038   CREATININE 0.75 07/16/2012 0910      Component Value Date/Time   CALCIUM 9.8 08/12/2014 1038   CALCIUM 10.1 07/16/2012 0910   ALKPHOS 199* 08/12/2014 1038   ALKPHOS 142* 07/16/2012 0910   AST 22 08/12/2014 1038   AST 30 07/16/2012 0910   ALT 20 08/12/2014 1038   ALT 33 07/16/2012 0910   BILITOT 0.27 08/12/2014 1038   BILITOT 0.2* 07/16/2012 0910       Lab Results  Component Value Date   LABCA2 58* 09/14/2012    No components found for: [id number]  No results for input(s): INR in the last 168 hours.  Urinalysis    Component Value Date/Time   COLORURINE YELLOW 09/11/2011 [place] 09/11/2011 0819    LABSPEC 1.025 07/14/2014 0913   LABSPEC 1.013 09/11/2011 0819   PHURINE 6.5 09/11/2011 0819   GLUCOSEU Negative 07/14/2014 0913   GLUCOSEU NEGATIVE 09/11/2011 0819   HGBUR SMALL* 09/11/2011 0819   BILIRUBINUR NEGATIVE 09/11/2011 0819   KETONESUR 15* 09/11/2011 0819   PROTEINUR NEGATIVE 09/11/2011 0819   UROBILINOGEN 0.2 07/14/2014 0913   UROBILINOGEN 0.2 09/11/2011 0819   NITRITE NEGATIVE 09/11/2011 0819   LEUKOCYTESUR NEGATIVE 09/11/2011 0819    STUDIES: Ct Chest W Contrast  09/07/2014   CLINICAL DATA:  Restaging of right breast cancer diagnosed in 2005 in 2013. Right mastectomy. Ongoing oral chemotherapy. Left spine surgery. Metastasis to brain and ribs.  EXAM: CT CHEST, ABDOMEN, AND PELVIS WITH CONTRAST  TECHNIQUE: Multidetector CT imaging of the chest, abdomen and pelvis was performed following the standard protocol during bolus administration of intravenous contrast.  CONTRAST:  15m OMNIPAQUE IOHEXOL 300 MG/ML SOLN, 1049mOMNIPAQUE IOHEXOL 300 MG/ML SOLN  COMPARISON:  Chest CT of 08/11/2014. Chest, abdomen, and pelvic CTs of 07/12/2014.  FINDINGS: RECIST 1.0  Target Lesions:  1. Right lower lobe pulmonary nodule which measures 6 mm on image 43, unchanged from 6 mm on the prior. 2. Normal size subcarinal node at 6 mm on image 31, unchanged. 3. Normal size right hilar node which measures 8 mm on image 30 and is unchanged. Non-target Lesions:  None  CT CHEST FINDINGS  Mediastinum/Nodes: No supraclavicular adenopathy.  right mastectomy and axillary node dissection. No axillary adenopathy. Mild cardiomegaly, without pericardial effusion. No central pulmonary embolism, on this non-dedicated study. No mediastinal or hilar adenopathy. No internal mammary adenopathy.  Lungs/Pleura: Probable secretions in the right-sided the trachea on image 13 and more inferiorly within the dependent trachea.  Mild volume loss and probable scarring at the right lung base. 6 mm right lower lobe nodule is unchanged on  image 43, as above.  Trace right pleural fluid or thickening, similar.  Musculoskeletal: No acute osseous abnormality.  CT ABDOMEN PELVIS FINDINGS  Hepatobiliary: Moderate hepatic steatosis. Vague right liver lobe subcapsular hyperenhancement image 58 is similar and likely related to altered perfusion. Fat sparing is identified adjacent to the gallbladder. Normal gallbladder, without biliary ductal dilatation. There is a periampullary duodenal diverticulum.  Pancreas: Mild pancreatic atrophy.  No duct dilatation.  Spleen: Normal  Adrenals/Urinary Tract: Normal adrenal glands. Normal kidneys, without hydronephrosis. Normal urinary bladder.  Stomach/Bowel: Normal stomach, without wall thickening. Scattered colonic diverticula. High Normal terminal ileum and appendix. Normal small bowel.  Vascular/Lymphatic: Aortic and branch vessel atherosclerosis. No abdominopelvic adenopathy.  Reproductive: Normal uterus and adnexa.  Other: No evidence of omental or peritoneal disease. No abdominal pelvic fluid.  Musculoskeletal: Advanced left hip osteoarthritis. Vague sclerosis involving the left iliac wing is unchanged, including on image 96 of series 2. There is also similar sclerosis involving the left side of the sacrum. Degenerative disc disease involves L3-4 and L5-S1. Similar medial eleventh left rib sclerosis which may be posttraumatic.  IMPRESSION: CT CHEST IMPRESSION  1. Right mastectomy and axillary node dissection. No evidence of metastatic disease or acute process. 2. Trace right pleural thickening or fluid, similar.  CT ABDOMEN AND PELVIS IMPRESSION  1. Similar osseous metastasis involving the left hemipelvis. 2. No extraosseous metastasis within the abdomen or pelvis. 3. Hepatic steatosis.   Electronically Signed   By: Abigail Miyamoto M.D.   On: 09/07/2014 12:11   Ct Chest W Contrast  08/11/2014   CLINICAL DATA:  Patient with history of metastatic breast cancer.  EXAM: CT CHEST WITH CONTRAST  TECHNIQUE: Multidetector  CT imaging of the chest was performed during intravenous contrast administration.  CONTRAST:  82m OMNIPAQUE IOHEXOL 300 MG/ML  SOLN  COMPARISON:  CT 07/12/2014  FINDINGS: RECIST 1.1  Target Lesions:  1. Right lower lobe pulmonary nodule measuring 6 mm (image 43; series 5), previously 6 mm. 2. Partially calcified subcarinal lymph node measuring 6 mm (image 31; series 2, previously 6 mm. 3. Right hilar lymph node measuring 9 mm (image 212; series 3), previously 9 mm. Non-target Lesions:  1. None CT chest:  Visualized thyroid is unremarkable. Postsurgical change compatible with right mastectomy. Normal heart size. No pericardial effusion. Aorta and main pulmonary artery normal in caliber. Unchanged 9 mm right hilar lymph node. Additionally 6 mm partially calcified subcarinal lymph node is unchanged.  Central airways are patent. Stable subpleural 6 mm right lower lobe pulmonary nodule (image 43; series 5). Left lung is clear. Trace right pleural fluid versus thickening.  Visualization of the upper abdomen demonstrates unchanged non mass like hyper attenuation within the right hepatic lobe, this is stable from prior. Unchanged minimal nodularity left adrenal gland. No aggressive or acute appearing osseous lesions.  IMPRESSION: Right mastectomy and axillary lymph node dissection.  Stable right lower lobe pulmonary nodule.   Electronically Signed   By: DLovey NewcomerM.D.   On: 08/11/2014 15:18   Ct Abdomen Pelvis W Contrast  09/07/2014   CLINICAL  DATA:  Restaging of right breast cancer diagnosed in 2005 in 2013. Right mastectomy. Ongoing oral chemotherapy. Left spine surgery. Metastasis to brain and ribs.  EXAM: CT CHEST, ABDOMEN, AND PELVIS WITH CONTRAST  TECHNIQUE: Multidetector CT imaging of the chest, abdomen and pelvis was performed following the standard protocol during bolus administration of intravenous contrast.  CONTRAST:  48m OMNIPAQUE IOHEXOL 300 MG/ML SOLN, 1090mOMNIPAQUE IOHEXOL 300 MG/ML SOLN   COMPARISON:  Chest CT of 08/11/2014. Chest, abdomen, and pelvic CTs of 07/12/2014.  FINDINGS: RECIST 1.0  Target Lesions:  1. Right lower lobe pulmonary nodule which measures 6 mm on image 43, unchanged from 6 mm on the prior. 2. Normal size subcarinal node at 6 mm on image 31, unchanged. 3. Normal size right hilar node which measures 8 mm on image 30 and is unchanged. Non-target Lesions:  None  CT CHEST FINDINGS  Mediastinum/Nodes: No supraclavicular adenopathy. right mastectomy and axillary node dissection. No axillary adenopathy. Mild cardiomegaly, without pericardial effusion. No central pulmonary embolism, on this non-dedicated study. No mediastinal or hilar adenopathy. No internal mammary adenopathy.  Lungs/Pleura: Probable secretions in the right-sided the trachea on image 13 and more inferiorly within the dependent trachea.  Mild volume loss and probable scarring at the right lung base. 6 mm right lower lobe nodule is unchanged on image 43, as above.  Trace right pleural fluid or thickening, similar.  Musculoskeletal: No acute osseous abnormality.  CT ABDOMEN PELVIS FINDINGS  Hepatobiliary: Moderate hepatic steatosis. Vague right liver lobe subcapsular hyperenhancement image 58 is similar and likely related to altered perfusion. Fat sparing is identified adjacent to the gallbladder. Normal gallbladder, without biliary ductal dilatation. There is a periampullary duodenal diverticulum.  Pancreas: Mild pancreatic atrophy.  No duct dilatation.  Spleen: Normal  Adrenals/Urinary Tract: Normal adrenal glands. Normal kidneys, without hydronephrosis. Normal urinary bladder.  Stomach/Bowel: Normal stomach, without wall thickening. Scattered colonic diverticula. High Normal terminal ileum and appendix. Normal small bowel.  Vascular/Lymphatic: Aortic and branch vessel atherosclerosis. No abdominopelvic adenopathy.  Reproductive: Normal uterus and adnexa.  Other: No evidence of omental or peritoneal disease. No abdominal  pelvic fluid.  Musculoskeletal: Advanced left hip osteoarthritis. Vague sclerosis involving the left iliac wing is unchanged, including on image 96 of series 2. There is also similar sclerosis involving the left side of the sacrum. Degenerative disc disease involves L3-4 and L5-S1. Similar medial eleventh left rib sclerosis which may be posttraumatic.  IMPRESSION: CT CHEST IMPRESSION  1. Right mastectomy and axillary node dissection. No evidence of metastatic disease or acute process. 2. Trace right pleural thickening or fluid, similar.  CT ABDOMEN AND PELVIS IMPRESSION  1. Similar osseous metastasis involving the left hemipelvis. 2. No extraosseous metastasis within the abdomen or pelvis. 3. Hepatic steatosis.   Electronically Signed   By: KyAbigail Miyamoto.D.   On: 09/07/2014 12:11      ASSESSMENT: [age].o. [place], [place] woman with stage IV breast cancer, on BOLERO-4 trial  (1) status post right mastectomy and sentinel lymph node sampling 03/22/2004 for a pT2 pN1, stage IIB invasive ductal carcinoma with lobular features, grade 2, estrogen receptor and progesterone receptor positive, HER-2 negative, with an MIB-1 of 9% ([id number] (2) addition all right axillary lymph node sampling 05/07/2004 showed 2 benign lymph nodes (4 lymph nodes previously removed, so total was one positive lymph node out of 6; [id number] (3) the patient was evaluated by radiation oncology; no postmastectomy radiation recommended  (4) adjuvant chemotherapy with dose dense  doxorubicin and cyclophosphamide x4 cycles (first cycle delayed one week) followed by dose dense paclitaxel x4 was completed 09/18/2004  (5) tamoxifen started March 2006, discontinued 2009  METASTATIC DISEASE: (6) presenting with a large pericardial effusion, large right pleural effusion and possible right middle lobe bronchial obstruction February 2013, status post pericardial window placement, fiberoptic bronchoscopy and right Pleurx placement  09/11/2011, with biopsy of the bronchus intermedius and pericardium positive for metastatic breast cancer, estrogen receptor 91% positive with moderate staining intensity, progesterone receptor 100% positive with strong staining intensity, with an MIB-1 of 35%, and no HER-2 amplification, the signals ratio being 1.37 ([id number])  (7) enrolled in BOLERO-4 trial 10/10/2011, receiving letrozole and everolimus  (a) two small areas of enhancement in the cerebellum noted by brain MRI 10/03/2011 were no longer apparent on repeat MRI 08/21/2012-- most recent brain MRI 04/01/2014 showed no evidence of intracranial metastatic disease  (b) sclerotic lesions in left iliac bone and sacrum have not been biopsied; stable; plan is to start zolendronate after patient updates her dental care (extraction planned)  (c) RLL lung nodule, stable (rescanned 07/12/2014 and 08/11/2014) R hilar and subcarinal lymph nodes: stable  (9) additional problems:  (a) hepatic steatosis  (b) COPD/ emphysema/ asthma  (c) advanced L hip osteoarthritis  (d) aortoiliac atherosclerosis  (e) dental evaluation pending w possible dental extractions  (f) possible thalassemia trait  (10) Bone density concerns: DEXA scan 08/11/2013 was normal   PLAN: Rilynne is doing well today. The CBC, CMET, and Uric Acid were reviewed and were entirely stable. The rest of the labs have not resulted yet. I have placed orders for a urinalysis to be performed today, given the patient's complaint of dysuria and gross hematuria. She will follow up with [facility] next Thursday.   Honor will continue on letrozole and everolimus. She had repeat scans performed yesterday and the results were "unchanged" from the scans performed in January.   Lavender will return in 4 weeks for labs and a follow up visit. She understands and agrees with this plan. She knows the goal of treatment in her case is control. She has been encouraged to call with any issues that might  arise before her next visit here.   Marcelino Duster, NP   09/08/2014 9:27 AM

## 2014-09-09 ENCOUNTER — Telehealth: Payer: Self-pay | Admitting: *Deleted

## 2014-09-09 NOTE — Telephone Encounter (Signed)
09/09/14 at 2:15pm - The research nurse received a call from the pt wanting to know about her urinalysis.  The research nurse called Barbara Borders, NP about the pt's urine test.  Barbara Parks said to notify the pt that there was a "large" amount of blood in the urine sample, but everything else including the protein was stable.  Barbara Parks said to let the pt know that she should follow up with her urologist next week at her scheduled appt on 09/15/14.  The research nurse discussed this with the pt.  The pt verbalized understanding.  She confirmed that she would see her urologist, Barbara Parks on 09/15/14.  The pt denied any worsening urinary symptoms.  The research nurse will follow up with the pt next week.

## 2014-09-11 LAB — URINE CULTURE

## 2014-09-13 ENCOUNTER — Telehealth: Payer: Self-pay | Admitting: *Deleted

## 2014-09-13 ENCOUNTER — Other Ambulatory Visit: Payer: Self-pay | Admitting: Oncology

## 2014-09-13 MED ORDER — NITROFURANTOIN MONOHYD MACRO 100 MG PO CAPS: 100.0000 mg | ORAL_CAPSULE | Freq: Two times a day (BID) | ORAL | Status: DC

## 2014-09-13 NOTE — Telephone Encounter (Signed)
This RN attempted to contact pt per MD request regarding need to initiate nitrofurantoin for urine culture positive for E-Coli.  Prescription sent to pt's documented pharmacy as well as this RN left message on identified VM of patient per above.  Requested return call to verify pt received message and understands to start on ABX therapy.

## 2014-09-14 ENCOUNTER — Telehealth: Payer: Self-pay | Admitting: *Deleted

## 2014-09-14 NOTE — Telephone Encounter (Signed)
09/14/14 at 9:44am - [facility] follow up note- The pt called the research nurse to inform her that Dr. Virgie Dad nurse, Marlon Pel, called her and told her to start taking the antibiotic, nitrofurantoin, for her urine culture positive for E-coli.  The pt wanted to verify that this antibiotic was acceptable to take with her everolimus.  The research nurse thanked the pt for contacting her before initiating the new medication.  Nitrofurantoin was not listed as a prohibited medication in the protocol.  The research nurse also spoke to [facility], pharmacist, about any interaction with nitrofurantoin and everolimus.  He checked and confirmed that there is "no interaction" between the 2 drugs.  The research nurse called the pt and told her that it was okay for her to start taking the nitrofurantoin today.  The pt said that she will begin the new medication today.  The pt also stated that she is scheduled to see her urologist tomorrow.   Brion Aliment RN, BSN, CCRP Clinical Research Nurse 09/14/2014 9:51 AM

## 2014-09-15 DIAGNOSIS — R312 Other microscopic hematuria: Secondary | ICD-10-CM | POA: Diagnosis not present

## 2014-10-05 ENCOUNTER — Other Ambulatory Visit: Payer: Self-pay | Admitting: *Deleted

## 2014-10-05 DIAGNOSIS — C50911 Malignant neoplasm of unspecified site of right female breast: Secondary | ICD-10-CM

## 2014-10-05 DIAGNOSIS — C7800 Secondary malignant neoplasm of unspecified lung: Principal | ICD-10-CM

## 2014-10-06 ENCOUNTER — Encounter: Payer: Self-pay | Admitting: *Deleted

## 2014-10-06 ENCOUNTER — Other Ambulatory Visit: Payer: Self-pay | Admitting: *Deleted

## 2014-10-06 ENCOUNTER — Encounter: Payer: Self-pay | Admitting: Nurse Practitioner

## 2014-10-06 ENCOUNTER — Other Ambulatory Visit (HOSPITAL_COMMUNITY)
Admission: RE | Admit: 2014-10-06 | Discharge: 2014-10-06 | Disposition: A | Discharge status: Home / Self Care | Payer: Medicare Other | Source: Ambulatory Visit | Attending: Oncology | Admitting: Oncology

## 2014-10-06 ENCOUNTER — Other Ambulatory Visit (HOSPITAL_BASED_OUTPATIENT_CLINIC_OR_DEPARTMENT_OTHER): Payer: Medicare Other

## 2014-10-06 ENCOUNTER — Ambulatory Visit (HOSPITAL_BASED_OUTPATIENT_CLINIC_OR_DEPARTMENT_OTHER): Payer: Medicare Other | Admitting: Nurse Practitioner

## 2014-10-06 ENCOUNTER — Other Ambulatory Visit: Payer: Self-pay | Admitting: Hematology and Oncology

## 2014-10-06 VITALS — BP 129/71 | HR 84 | Temp 97.5°F | Resp 18 | Ht 65.0 in | Wt 173.5 lb

## 2014-10-06 DIAGNOSIS — C7801 Secondary malignant neoplasm of right lung: Secondary | ICD-10-CM

## 2014-10-06 DIAGNOSIS — Z006 Encounter for examination for normal comparison and control in clinical research program: Secondary | ICD-10-CM

## 2014-10-06 DIAGNOSIS — C50911 Malignant neoplasm of unspecified site of right female breast: Secondary | ICD-10-CM

## 2014-10-06 DIAGNOSIS — C50919 Malignant neoplasm of unspecified site of unspecified female breast: Secondary | ICD-10-CM

## 2014-10-06 DIAGNOSIS — C7800 Secondary malignant neoplasm of unspecified lung: Principal | ICD-10-CM

## 2014-10-06 LAB — CBC WITH DIFFERENTIAL/PLATELET
BASO%: 0.5 % (ref 0.0–2.0)
Basophils Absolute: 0 10*3/uL (ref 0.0–0.1)
EOS ABS: 0.1 10*3/uL (ref 0.0–0.5)
EOS%: 1.8 % (ref 0.0–7.0)
HEMATOCRIT: 43.2 % (ref 34.8–46.6)
HGB: 14.2 g/dL (ref 11.6–15.9)
LYMPH#: 1.1 10*3/uL (ref 0.9–3.3)
LYMPH%: 13.7 % — ABNORMAL LOW (ref 14.0–49.7)
MCH: 26.4 pg (ref 25.1–34.0)
MCHC: 32.9 g/dL (ref 31.5–36.0)
MCV: 80.4 fL (ref 79.5–101.0)
MONO#: 0.8 10*3/uL (ref 0.1–0.9)
MONO%: 10.1 % (ref 0.0–14.0)
NEUT#: 5.7 10*3/uL (ref 1.5–6.5)
NEUT%: 73.9 % (ref 38.4–76.8)
Platelets: 288 10*3/uL (ref 145–400)
RBC: 5.37 10*6/uL (ref 3.70–5.45)
RDW: 14.6 % — AB (ref 11.2–14.5)
WBC: 7.7 10*3/uL (ref 3.9–10.3)

## 2014-10-06 LAB — BILIRUBIN, DIRECT: Bilirubin, Direct: <0.1 mg/dL (ref 0.0–0.5)

## 2014-10-06 LAB — COMPREHENSIVE METABOLIC PANEL (CC13)
ALBUMIN: 3.6 g/dL (ref 3.5–5.0)
ALT: 29 U/L (ref 0–55)
ANION GAP: 11 meq/L (ref 3–11)
AST: 30 U/L (ref 5–34)
Alkaline Phosphatase: 222 U/L — ABNORMAL HIGH (ref 40–150)
BILIRUBIN TOTAL: 0.3 mg/dL (ref 0.20–1.20)
BUN: 18.6 mg/dL (ref 7.0–26.0)
CO2: 21 mEq/L — ABNORMAL LOW (ref 22–29)
CREATININE: 0.9 mg/dL (ref 0.6–1.1)
Calcium: 10.1 mg/dL (ref 8.4–10.4)
Chloride: 107 mEq/L (ref 98–109)
EGFR: 66 mL/min/{1.73_m2} — AB (ref >90)
Glucose: 145 mg/dl — ABNORMAL HIGH (ref 70–140)
POTASSIUM: 4.4 meq/L (ref 3.5–5.1)
SODIUM: 140 meq/L (ref 136–145)
Total Protein: 8 g/dL (ref 6.4–8.3)

## 2014-10-06 LAB — LIPID PANEL
CHOL/HDL RATIO: 5.7 ratio
Cholesterol: 260 mg/dL — ABNORMAL HIGH (ref 0–200)
HDL: 46 mg/dL (ref >=46)
LDL CALC: 135 mg/dL — AB (ref 0–99)
Triglycerides: 393 mg/dL — ABNORMAL HIGH (ref <150)
VLDL: 79 mg/dL — ABNORMAL HIGH (ref 0–40)

## 2014-10-06 LAB — URIC ACID (CC13): Uric Acid, Serum: 3.3 mg/dl (ref 2.6–7.4)

## 2014-10-06 LAB — PHOSPHORUS: PHOSPHORUS: 3.1 mg/dL (ref 2.3–4.6)

## 2014-10-06 LAB — CK: CK TOTAL: 89 U/L (ref 7–177)

## 2014-10-06 MED ORDER — INV-EVEROLIMUS (RAD0001) 5MG TABLET NOVARTIS CRAD001Y24135: 2.0000 | ORAL_TABLET | Freq: Every day | ORAL | Status: DC

## 2014-10-06 NOTE — Progress Notes (Signed)
10/06/14 at 3:05pm- [employer]/ [facility] - cycle 40 day 1 study notes- The pt was into the cancer center this am for her fasting labs and cycle 40, day 1assessments. The pt confirmed that she was fasting upon arrival to the cancer center for her labs. The pt was seen and examined today by Dr. Virgie Dad NP, Gentry Fitz. Heather reviewed the pt's labs today, and she felt that her abnormal lab values were "not clinically significant". Her glucose was slightly elevated today at 145. The pt's vitals and weight were stable, and her mean BP was obtained for study purposes. The pt denied any urinary symptoms today.  She specifically denied dysuria and gross hematuria.  The pt said that she was seen by her urologist on 09/15/14.She said that her urologist told her that her bladder was "beautiful".  He also could not determine the source of her past hematuria. She states that she stopped her antibiotic on 09/21/14.   She reports the following AE's as ongoing: hot flashes, memory impairment, intermittent nausea, arthritis in hands, fatigue, back pain, and bilateral knee pain. She states that her "upper respiratory cold" resolved on 09/15/14. The pt denies any new medications. She reports the following concomitant medications as ongoing: Lopressor, compazine prn, immodium prn, lopid, ibuprofen prn, and vitamin D3. She is performing all of her usual activities with some limitations. ECOG=1. The pt said that she overall feels good, and she is really enjoying the great weather and being outside. The pt's lipid panel is pending. The pt returned her cycle 39 everolimus study drug boxes. She confirmed that she took her everolimus (10 mg) every day along with letrozole (2.5 mg) from 09/08/14 - 10/05/14 (28 days). The pt's everolimus drug boxes had 0 remaining (unopened) blister packs. The pt was dispensed 56 pills - 56 doses taken (28 days x 2 doses) = 0 returned. Therefore, the pt is 100% compliant with her study drug,  everolimus, for cycle 39. The pt's everolimus was taken to the pharmacy for the drug accountability check and storage. The pt was dispensed her cycle 40 everolimus study drug boxes for self administration. The pt is aware of her April and May appointments.   10/06/14 at 3:19pm - The pt's lipid panel is stable.

## 2014-10-06 NOTE — Progress Notes (Signed)
[facility]  Telephone:([phone] Fax:([phone]) [phone]     ID: TONIETTE DEVERA DOB: 1963-05-10  MR#: [id number]  KMM#:[id number]  Patient Care Team: Darcus Austin, MD as PCP - General (Family Medicine) Jerline Pain, MD (Cardiology) Eston Esters, MD (Hematology and Oncology) Gaye Pollack, MD (Cardiothoracic Surgery)  OTHER MD: Dr Erroll Luna- Merlene Laughter, DDS  CHIEF COMPLAINT: metastatic breast cancer, on BOLERO study  CURRENT TREATMENT: letrozole + everolimus  BREAST CANCER HISTORY: From Dr. Collier [name]'s original intake note 04/13/2004:  "This woman has been in good health all of her life. She recently moved from [place] to work here.  She palpated a mass at about the 12 o'clock position in mid-July. She has not noticed any nipple retraction or skin changes.  She was seen by her primary care doctor who subsequently referred her for a mammogram.  Mammogram was performed on 03/01/04. This demonstrated a spiculated 2 cm mass at the 12 o'clock position in the right breast.  Upper outer quadrant of the left breast shows some distortion.   Physical exam at that time showed a firm, nontender nodule at the 12 o'clock position in the right breast, 5 cm from the nipple.  Ultrasound of this area showed a hypoechoic ill-defined mass, measuring 2.2 x 1.3 x 1.4 cm.  Physical exam of the left breast showed general vague thickening, upper outer quadrant of the left breast with a discrete palpable mass. The ultrasound performed showed a single hypoechoic ill-defined nodule at the 1 o'clock position, measuring 7 x 5 x 6 mm.  She had biopsies of both lesions on 03/02/03.  Needle core biopsy of the lesion on the right breast revealed invasive mammary carcinoma.  Needle core biopsy of the left breast showed a complex fibroadenoma.  Prognostic panel of the lesion on the right breast showed it to be ER positive at 73%, PR positive at 90% and proliferative index 9%, HER-2 was 1+.  Patient was referred  to Dr. Annamaria Boots, who performed a simple mastectomy with sentinel lymph node evaluation on 03/22/04.  Final pathology showed this to be an invasive ductal carcinoma  with lobular features, measuring 2.2 cm, grade 2 of 3.  Margins negative for carcinoma.  Invasive ductal carcinoma was extended to involve deep dermis of the nipple.  Lymphovascular invasion was identified.  Total of 4 sentinel lymph nodes were evaluated.  Touch imprints at the time of the OR was felt to be negative.  Subsequent evaluation showed a 5 mm focus of metastatic carcinoma in one of the four lymph nodes on microscopic after sectioning.  There was extracapsular extension  of one lymph node as well. The remaining three lymph nodes were all negative."  The patient's subsequent history is detailed below.  INTERVAL HISTORY: Shawnell returns today for follow up of her metastatic breast cancer , accompanied by her research nurse [name]. She continues on the [place] trial, currently on cycle 40. This consists of everolimus and letrozole. She tolerates both drugs well. She has hot flashes mainly at night, but denies vaginal changes. Since her last visit, she was treated with nitrofurantoin for a mild e.coli colonization that turned symptomatic. She was evaluated by [facility] and was told she had a "beautiful bladder" and that the microhematuria could not be traced to malignant causes. She has since had no urinary changes or symptoms.  REVIEW OF SYSTEMS: Enolia denies fevers, chills, or changes in bowel habits. She was queezy a bit this morning, but she thinks this  is because she had to fast for her labs. She denies shortness of breath, chest pain, cough, or palpitations. Her energy level is good during the day and she sleeps well at night. She hasn't been as strict with her diet over the course of the winter, but states that "salad weather" is approaching and plans to do better. She denies headaches, dizziness, or vision changes. A  detailed review of systems is otherwise stable.  PAST MEDICAL HISTORY: Past Medical History  Diagnosis Date  . Asthma   . Shortness of breath   . Arthritis     left hip  . Contact dermatitis     from a bandaid that had Latex   . Osteopenia due to cancer therapy 09/09/2013  . Hypercholesteremia   . ADHD (attention deficit hyperactivity disorder)   . breast ca 2005    breast/chemo R mastectomy  . Metastasis to lung dx'd 08/2011    PAST SURGICAL HISTORY: Past Surgical History  Procedure Laterality Date  . Spine surgery  1996  . Breast surgery  2005    right  . Video bronchoscopy  09/11/2011    Procedure: VIDEO BRONCHOSCOPY;  Surgeon: Gaye Pollack, MD;  Location: [facility];  Service: Thoracic;  Laterality: N/A;  . Chest tube insertion  09/11/2011    Procedure: INSERTION PLEURAL DRAINAGE CATHETER;  Surgeon: Gaye Pollack, MD;  Location: [facility];  Service: Thoracic;  Laterality: Right;  . Pericardial window  09/11/2011    Procedure: PERICARDIAL WINDOW;  Surgeon: Gaye Pollack, MD;  Location: [facility];  Service: Thoracic;  Laterality: N/A;  . Removal of pleural drainage catheter  12/19/2011    Procedure: REMOVAL OF PLEURAL DRAINAGE CATHETER;  Surgeon: Gaye Pollack, MD;  Location: [facility];  Service: Thoracic;  Laterality: Right;  TO BE DONE IN MINOR ROOM, SHORT STAY  . Cataract extraction w/phaco Left 01/18/2014    Procedure: CATARACT EXTRACTION PHACO AND INTRAOCULAR LENS PLACEMENT (IOC);  Surgeon: Elta Guadeloupe T. Gershon Crane, MD;  Location: [facility];  Service: Ophthalmology;  Laterality: Left;  CDE 15.79  . Cataract extraction w/phaco Right 02/08/2014    Procedure: CATARACT EXTRACTION PHACO AND INTRAOCULAR LENS PLACEMENT (IOC);  Surgeon: Elta Guadeloupe T. Gershon Crane, MD;  Location: [facility];  Service: Ophthalmology;  Laterality: Right;  CDE 4.41    FAMILY HISTORY Family History  Problem Relation Age of Onset  . Cancer Mother     breast  . Heart disease Father   . Anesthesia problems Neg Hx    the patient's mother was  diagnosed with breast cancer at age 86, she died age 53 from congestive heart failure..The patient's father died from heart disease at age 56.  She has one sister alive & well.  Two brothers alive & well, one with diabetes. The patient's sister was also diagnosed with breast cancer, and was tested for the BRCA gene, and was negative. The patient herself has not been tested. There is no history of ovarian cancer in the family  GYNECOLOGIC HISTORY:  No LMP recorded. Patient is postmenopausal. Menarche age 60, the patient is GX P0. She stopped having periods with chemotherapy in 2005. 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 She was a Archivist. She is single, lives alone with her Shitzu-poodle Sammie.. Family is all in the [place] area.     ADVANCED DIRECTIVES: Not in place. At the 02/23/2014 visit the  patient was given the appropriate documents to complete and notarize at her discretion. She tells me she is planning to name her sister, Billie Ruddy, as healthcare power of attorney. [name] can be reached at [phone]   HEALTH MAINTENANCE: History  Substance Use Topics  . Smoking status: Former Smoker -- 1.50 packs/day for 30 years    Types: Cigarettes    Quit date: 09/10/2007  . Smokeless tobacco: Not on file  . Alcohol Use: No     Colonoscopy:  PAP:  Bone density: 08/11/2013, showed osteopenia  Lipid panel:  Allergies  Allergen Reactions  . Aspirin     REACTION: upset stomach  . Latex Other (See Comments)    Blistering and skin peels off  . Nsaids Nausea And Vomiting    Extreme nausea and vomiting    Current Outpatient Prescriptions  Medication Sig Dispense Refill  . cholecalciferol (VITAMIN D) 1000 UNITS tablet Take 2,000 Units by mouth daily.     Marland Kitchen gemfibrozil (LOPID) 600 MG tablet TAKE ONE TABLET BY MOUTH TWICE DAILY BEFORE A  MEAL 60 tablet 2  .  Investigational everolimus (RAD001) 5 MG tablet Novartis [id number] Take 2 tablets by mouth daily. Take with a glass of water. 70 tablet 0  . Investigational everolimus (RAD001) 5 MG tablet Novartis [id number] Take 2 tablets by mouth daily. Take with a glass of water. 70 tablet 0  . letrozole (FEMARA) 2.5 MG tablet Take 1 tablet (2.5 mg total) by mouth daily. 30 tablet 0  . loperamide (IMODIUM) 2 MG capsule Take 2 mg by mouth 4 (four) times daily as needed for diarrhea or loose stools.    Marland Kitchen LORazepam (ATIVAN) 0.5 MG tablet Take 1 tablet (0.5 mg total) by mouth every 6 (six) hours as needed (Nausea or vomiting). 30 tablet 0  . metoprolol tartrate (LOPRESSOR) 25 MG tablet Take 0.5 tablets (12.5 mg total) by mouth daily. 30 tablet 4  . omeprazole (PRILOSEC) 40 MG capsule Take 1 capsule (40 mg total) by mouth daily. 90 capsule 3  . prochlorperazine (COMPAZINE) 10 MG tablet Take 1 tablet (10 mg total) by mouth every 6 (six) hours as needed (Nausea or vomiting). 30 tablet 1   No current facility-administered medications for this visit.    OBJECTIVE: Middle-aged white woman in no acute distress using cane to ambulate Filed Vitals:   10/06/14 0937  BP: 129/71  Pulse:   Temp:   Resp:      Body mass index is 28.87 kg/(m^2).    ECOG FS:1 - Symptomatic but completely ambulatory  Skin: warm, dry  HEENT: sclerae anicteric, conjunctivae pink, oropharynx clear. No thrush or mucositis.  Lymph Nodes: No cervical or supraclavicular lymphadenopathy  Lungs: clear to auscultation bilaterally, no rales, wheezes, or rhonci  Heart: regular rate and rhythm  Abdomen: round, soft, non tender, positive bowel sounds  Musculoskeletal: No focal spinal tenderness, no peripheral edema  Neuro: non focal, well oriented, positive affect  Breasts: deferred  LAB RESULTS:  CMP     Component Value Date/Time   NA 140 10/06/2014 0906   NA 136 07/16/2012 0910   K 4.4 10/06/2014 0906   K 4.0 07/16/2012 0910   CL  109* 12/31/2012 0940   CL 103 07/16/2012 0910   CO2 21* 10/06/2014 0906   CO2 21 07/16/2012 0910   GLUCOSE 145* 10/06/2014 0906   GLUCOSE 127* 12/31/2012 0940   GLUCOSE 140* 07/16/2012 0910   BUN 18.6 10/06/2014 0906   BUN 16 07/16/2012 0910  CREATININE 0.9 10/06/2014 0906   CREATININE 0.75 07/16/2012 0910   CALCIUM 10.1 10/06/2014 0906   CALCIUM 10.1 07/16/2012 0910   PROT 8.0 10/06/2014 0906   PROT 8.0 07/16/2012 0910   ALBUMIN 3.6 10/06/2014 0906   ALBUMIN 3.7 07/16/2012 0910   AST 30 10/06/2014 0906   AST 30 07/16/2012 0910   ALT 29 10/06/2014 0906   ALT 33 07/16/2012 0910   ALKPHOS 222* 10/06/2014 0906   ALKPHOS 142* 07/16/2012 0910   BILITOT 0.30 10/06/2014 0906   BILITOT 0.2* 07/16/2012 0910   GFRNONAA >90 09/13/2011 0440   GFRAA >90 09/13/2011 0440    I No results found for: SPEP  Lab Results  Component Value Date   WBC 7.7 10/06/2014   NEUTROABS 5.7 10/06/2014   HGB 14.2 10/06/2014   HCT 43.2 10/06/2014   MCV 80.4 10/06/2014   PLT 288 10/06/2014      Chemistry      Component Value Date/Time   NA 140 10/06/2014 0906   NA 136 07/16/2012 0910   K 4.4 10/06/2014 0906   K 4.0 07/16/2012 0910   CL 109* 12/31/2012 0940   CL 103 07/16/2012 0910   CO2 21* 10/06/2014 0906   CO2 21 07/16/2012 0910   BUN 18.6 10/06/2014 0906   BUN 16 07/16/2012 0910   CREATININE 0.9 10/06/2014 0906   CREATININE 0.75 07/16/2012 0910      Component Value Date/Time   CALCIUM 10.1 10/06/2014 0906   CALCIUM 10.1 07/16/2012 0910   ALKPHOS 222* 10/06/2014 0906   ALKPHOS 142* 07/16/2012 0910   AST 30 10/06/2014 0906   AST 30 07/16/2012 0910   ALT 29 10/06/2014 0906   ALT 33 07/16/2012 0910   BILITOT 0.30 10/06/2014 0906   BILITOT 0.2* 07/16/2012 0910       Lab Results  Component Value Date   LABCA2 58* 09/14/2012    No components found for: [id number]  No results for input(s): INR in the last 168 hours.  Urinalysis    Component Value Date/Time   COLORURINE  YELLOW 09/11/2011 [place] 09/11/2011 0819   LABSPEC 1.020 09/08/2014 0956   LABSPEC 1.013 09/11/2011 0819   PHURINE 6.5 09/11/2011 0819   GLUCOSEU Negative 09/08/2014 0956   GLUCOSEU NEGATIVE 09/11/2011 0819   HGBUR SMALL* 09/11/2011 0819   BILIRUBINUR NEGATIVE 09/11/2011 0819   KETONESUR 15* 09/11/2011 0819   PROTEINUR NEGATIVE 09/11/2011 0819   UROBILINOGEN 0.2 09/08/2014 0956   UROBILINOGEN 0.2 09/11/2011 0819   NITRITE Negative 09/08/2014 0956   NITRITE NEGATIVE 09/11/2011 0819   LEUKOCYTESUR NEGATIVE 09/11/2011 0819    STUDIES: Ct Chest W Contrast  09/07/2014   CLINICAL DATA:  Restaging of right breast cancer diagnosed in 2005 in 2013. Right mastectomy. Ongoing oral chemotherapy. Left spine surgery. Metastasis to brain and ribs.  EXAM: CT CHEST, ABDOMEN, AND PELVIS WITH CONTRAST  TECHNIQUE: Multidetector CT imaging of the chest, abdomen and pelvis was performed following the standard protocol during bolus administration of intravenous contrast.  CONTRAST:  28m OMNIPAQUE IOHEXOL 300 MG/ML SOLN, 1045mOMNIPAQUE IOHEXOL 300 MG/ML SOLN  COMPARISON:  Chest CT of 08/11/2014. Chest, abdomen, and pelvic CTs of 07/12/2014.  FINDINGS: RECIST 1.0  Target Lesions:  1. Right lower lobe pulmonary nodule which measures 6 mm on image 43, unchanged from 6 mm on the prior. 2. Normal size subcarinal node at 6 mm on image 31, unchanged. 3. Normal size right hilar node which measures 8 mm on image 30 and  is unchanged. Non-target Lesions:  None  CT CHEST FINDINGS  Mediastinum/Nodes: No supraclavicular adenopathy. right mastectomy and axillary node dissection. No axillary adenopathy. Mild cardiomegaly, without pericardial effusion. No central pulmonary embolism, on this non-dedicated study. No mediastinal or hilar adenopathy. No internal mammary adenopathy.  Lungs/Pleura: Probable secretions in the right-sided the trachea on image 13 and more inferiorly within the dependent trachea.  Mild volume  loss and probable scarring at the right lung base. 6 mm right lower lobe nodule is unchanged on image 43, as above.  Trace right pleural fluid or thickening, similar.  Musculoskeletal: No acute osseous abnormality.  CT ABDOMEN PELVIS FINDINGS  Hepatobiliary: Moderate hepatic steatosis. Vague right liver lobe subcapsular hyperenhancement image 58 is similar and likely related to altered perfusion. Fat sparing is identified adjacent to the gallbladder. Normal gallbladder, without biliary ductal dilatation. There is a periampullary duodenal diverticulum.  Pancreas: Mild pancreatic atrophy.  No duct dilatation.  Spleen: Normal  Adrenals/Urinary Tract: Normal adrenal glands. Normal kidneys, without hydronephrosis. Normal urinary bladder.  Stomach/Bowel: Normal stomach, without wall thickening. Scattered colonic diverticula. High Normal terminal ileum and appendix. Normal small bowel.  Vascular/Lymphatic: Aortic and branch vessel atherosclerosis. No abdominopelvic adenopathy.  Reproductive: Normal uterus and adnexa.  Other: No evidence of omental or peritoneal disease. No abdominal pelvic fluid.  Musculoskeletal: Advanced left hip osteoarthritis. Vague sclerosis involving the left iliac wing is unchanged, including on image 96 of series 2. There is also similar sclerosis involving the left side of the sacrum. Degenerative disc disease involves L3-4 and L5-S1. Similar medial eleventh left rib sclerosis which may be posttraumatic.  IMPRESSION: CT CHEST IMPRESSION  1. Right mastectomy and axillary node dissection. No evidence of metastatic disease or acute process. 2. Trace right pleural thickening or fluid, similar.  CT ABDOMEN AND PELVIS IMPRESSION  1. Similar osseous metastasis involving the left hemipelvis. 2. No extraosseous metastasis within the abdomen or pelvis. 3. Hepatic steatosis.   Electronically Signed   By: Abigail Miyamoto M.D.   On: 09/07/2014 12:11   Ct Abdomen Pelvis W Contrast  09/07/2014   CLINICAL DATA:   Restaging of right breast cancer diagnosed in 2005 in 2013. Right mastectomy. Ongoing oral chemotherapy. Left spine surgery. Metastasis to brain and ribs.  EXAM: CT CHEST, ABDOMEN, AND PELVIS WITH CONTRAST  TECHNIQUE: Multidetector CT imaging of the chest, abdomen and pelvis was performed following the standard protocol during bolus administration of intravenous contrast.  CONTRAST:  29m OMNIPAQUE IOHEXOL 300 MG/ML SOLN, 1062mOMNIPAQUE IOHEXOL 300 MG/ML SOLN  COMPARISON:  Chest CT of 08/11/2014. Chest, abdomen, and pelvic CTs of 07/12/2014.  FINDINGS: RECIST 1.0  Target Lesions:  1. Right lower lobe pulmonary nodule which measures 6 mm on image 43, unchanged from 6 mm on the prior. 2. Normal size subcarinal node at 6 mm on image 31, unchanged. 3. Normal size right hilar node which measures 8 mm on image 30 and is unchanged. Non-target Lesions:  None  CT CHEST FINDINGS  Mediastinum/Nodes: No supraclavicular adenopathy. right mastectomy and axillary node dissection. No axillary adenopathy. Mild cardiomegaly, without pericardial effusion. No central pulmonary embolism, on this non-dedicated study. No mediastinal or hilar adenopathy. No internal mammary adenopathy.  Lungs/Pleura: Probable secretions in the right-sided the trachea on image 13 and more inferiorly within the dependent trachea.  Mild volume loss and probable scarring at the right lung base. 6 mm right lower lobe nodule is unchanged on image 43, as above.  Trace right pleural fluid or thickening, similar.  Musculoskeletal:  No acute osseous abnormality.  CT ABDOMEN PELVIS FINDINGS  Hepatobiliary: Moderate hepatic steatosis. Vague right liver lobe subcapsular hyperenhancement image 58 is similar and likely related to altered perfusion. Fat sparing is identified adjacent to the gallbladder. Normal gallbladder, without biliary ductal dilatation. There is a periampullary duodenal diverticulum.  Pancreas: Mild pancreatic atrophy.  No duct dilatation.  Spleen:  Normal  Adrenals/Urinary Tract: Normal adrenal glands. Normal kidneys, without hydronephrosis. Normal urinary bladder.  Stomach/Bowel: Normal stomach, without wall thickening. Scattered colonic diverticula. High Normal terminal ileum and appendix. Normal small bowel.  Vascular/Lymphatic: Aortic and branch vessel atherosclerosis. No abdominopelvic adenopathy.  Reproductive: Normal uterus and adnexa.  Other: No evidence of omental or peritoneal disease. No abdominal pelvic fluid.  Musculoskeletal: Advanced left hip osteoarthritis. Vague sclerosis involving the left iliac wing is unchanged, including on image 96 of series 2. There is also similar sclerosis involving the left side of the sacrum. Degenerative disc disease involves L3-4 and L5-S1. Similar medial eleventh left rib sclerosis which may be posttraumatic.  IMPRESSION: CT CHEST IMPRESSION  1. Right mastectomy and axillary node dissection. No evidence of metastatic disease or acute process. 2. Trace right pleural thickening or fluid, similar.  CT ABDOMEN AND PELVIS IMPRESSION  1. Similar osseous metastasis involving the left hemipelvis. 2. No extraosseous metastasis within the abdomen or pelvis. 3. Hepatic steatosis.   Electronically Signed   By: Abigail Miyamoto M.D.   On: 09/07/2014 12:11      ASSESSMENT: 64 y.o. [place], [place] woman with stage IV breast cancer, on BOLERO-4 trial  (1) status post right mastectomy and sentinel lymph node sampling 03/22/2004 for a pT2 pN1, stage IIB invasive ductal carcinoma with lobular features, grade 2, estrogen receptor and progesterone receptor positive, HER-2 negative, with an MIB-1 of 9% ([id number] and [id number])  (2) addition all right axillary lymph node sampling 05/07/2004 showed 2 benign lymph nodes (4 lymph nodes previously removed, so total was one positive lymph node out of 6; [id number])  (3) the patient was evaluated by radiation oncology; no postmastectomy radiation recommended  (4) adjuvant chemotherapy with  dose dense doxorubicin and cyclophosphamide x4 cycles (first cycle delayed one week) followed by dose dense paclitaxel x4 was completed 09/18/2004  (5) tamoxifen started March 2006, discontinued 2009  METASTATIC DISEASE: (6) presenting with a large pericardial effusion, large right pleural effusion and possible right middle lobe bronchial obstruction February 2013, status post pericardial window placement, fiberoptic bronchoscopy and right Pleurx placement 09/11/2011, with biopsy of the bronchus intermedius and pericardium positive for metastatic breast cancer, estrogen receptor 91% positive with moderate staining intensity, progesterone receptor 100% positive with strong staining intensity, with an MIB-1 of 35%, and no HER-2 amplification, the signals ratio being 1.37 ([id number])  (7) enrolled in BOLERO-4 trial 10/10/2011, receiving letrozole and everolimus  (a) two small areas of enhancement in the cerebellum noted by brain MRI 10/03/2011 were no longer apparent on repeat MRI 08/21/2012-- most recent brain MRI 04/01/2014 showed no evidence of intracranial metastatic disease  (b) sclerotic lesions in left iliac bone and sacrum have not been biopsied; stable; plan is to start zolendronate after patient updates her dental care (extraction planned)  (c) RLL lung nodule, stable (rescanned 07/12/2014 and 08/11/2014) R hilar and subcarinal lymph nodes: stable  (9) additional problems:  (a) hepatic steatosis  (b) COPD/ emphysema/ asthma  (c) advanced L hip osteoarthritis  (d) aortoiliac atherosclerosis  (e) dental evaluation pending w possible dental extractions  (f) possible thalassemia trait  (10) Bone density concerns:  DEXA scan 08/11/2013 was normal   PLAN: Karrigan continues to manage treatment well. Besides the hot flashes she is completely asymptomatic. She declines offers for pharmaceutical intervention, such as gabapentin, for this complaint. The CBC, CMET, and uric acid were reviewed in  detail and were entirely stable. The rest of the labs were not yet available for review.  Sephira will continue on the letrozole and everolimus daily as ordered. She will return in 4 weeks for labs and a follow up visit. Prior to this visit, she will have a repeat CT of the chest, abdomen, and pelvis performed. She understands and agrees with this plan. She knows the goal of treatment in her case is control. She has been encouraged to call with any issues that might arise before her next visit here.    Laurie Panda, NP   10/06/2014 9:51 AM

## 2014-11-02 ENCOUNTER — Ambulatory Visit (HOSPITAL_COMMUNITY)
Admission: RE | Admit: 2014-11-02 | Discharge: 2014-11-02 | Disposition: A | Discharge status: Home / Self Care | Payer: Medicare Other | Source: Ambulatory Visit | Attending: Oncology | Admitting: Oncology

## 2014-11-02 ENCOUNTER — Ambulatory Visit (HOSPITAL_COMMUNITY): Payer: Medicare Other

## 2014-11-02 ENCOUNTER — Encounter (HOSPITAL_COMMUNITY): Payer: Self-pay

## 2014-11-02 ENCOUNTER — Other Ambulatory Visit: Payer: Medicare Other

## 2014-11-02 DIAGNOSIS — R0602 Shortness of breath: Secondary | ICD-10-CM | POA: Diagnosis not present

## 2014-11-02 DIAGNOSIS — R911 Solitary pulmonary nodule: Secondary | ICD-10-CM | POA: Diagnosis not present

## 2014-11-02 DIAGNOSIS — K760 Fatty (change of) liver, not elsewhere classified: Secondary | ICD-10-CM | POA: Insufficient documentation

## 2014-11-02 DIAGNOSIS — C50911 Malignant neoplasm of unspecified site of right female breast: Secondary | ICD-10-CM | POA: Diagnosis not present

## 2014-11-02 DIAGNOSIS — R079 Chest pain, unspecified: Secondary | ICD-10-CM | POA: Diagnosis not present

## 2014-11-02 DIAGNOSIS — C7800 Secondary malignant neoplasm of unspecified lung: Secondary | ICD-10-CM

## 2014-11-02 DIAGNOSIS — C7951 Secondary malignant neoplasm of bone: Secondary | ICD-10-CM | POA: Diagnosis not present

## 2014-11-02 DIAGNOSIS — Z9011 Acquired absence of right breast and nipple: Secondary | ICD-10-CM | POA: Insufficient documentation

## 2014-11-02 DIAGNOSIS — Z853 Personal history of malignant neoplasm of breast: Secondary | ICD-10-CM | POA: Insufficient documentation

## 2014-11-02 DIAGNOSIS — C7801 Secondary malignant neoplasm of right lung: Secondary | ICD-10-CM | POA: Insufficient documentation

## 2014-11-02 IMAGING — CT CT CHEST W/ CM
2 of 5 series · 16 of 46 positions shown, 18 images · IV contrast (OMNIPAQUE)
Comparison: 02/23/2013

CLINICAL DATA: Followup of breast cancer. Ongoing oral
chemotherapy.

EXAM:
CT CHEST, ABDOMEN, AND PELVIS WITH CONTRAST
TECHNIQUE: Multidetector CT imaging of the chest, abdomen and pelvis was
performed following the standard protocol during bolus
administration of intravenous contrast.
CONTRAST:  50mL OMNIPAQUE IOHEXOL 300 MG/ML SOLN, 100mL OMNIPAQUE
IOHEXOL 300 MG/ML SOLN

[Series 2: cap with st · axial · 0.86mm/px · z∈[-602,-56]mm · 13 of 125 slices shown, 15 images]
[im 8/125  soft-tissue]
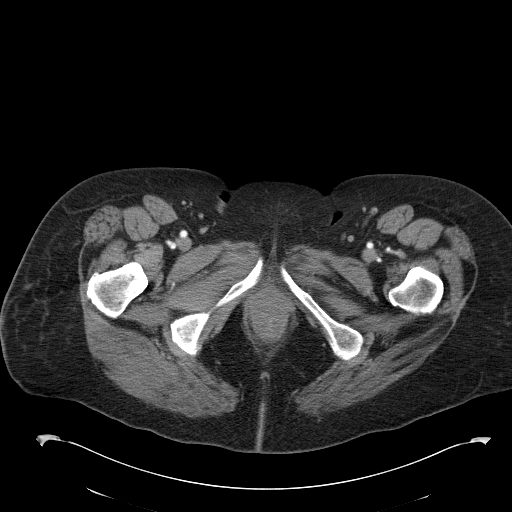
[im 8/125  bone]
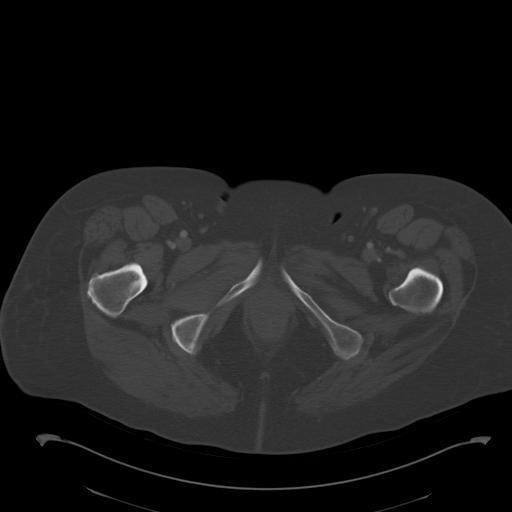
[im 15/125  soft-tissue]
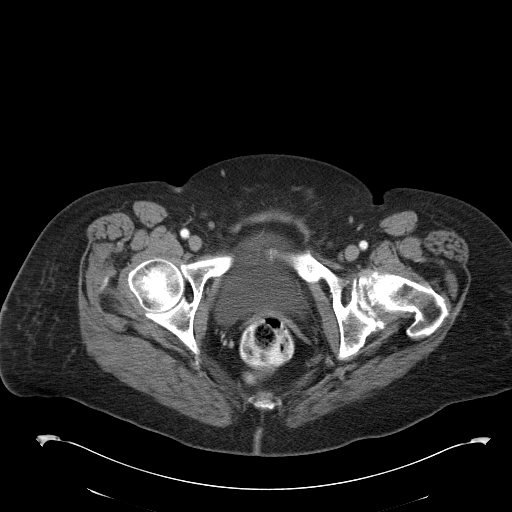
[im 30/125  soft-tissue]
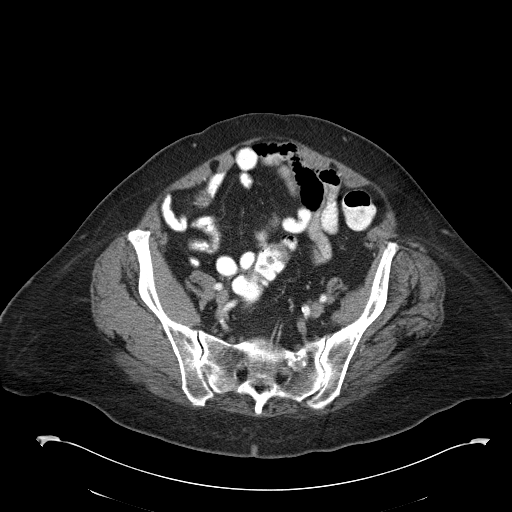
[im 37/125  soft-tissue]
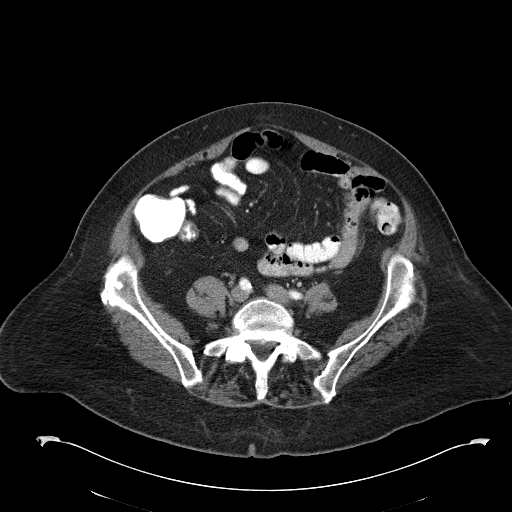
[im 44/125  soft-tissue]
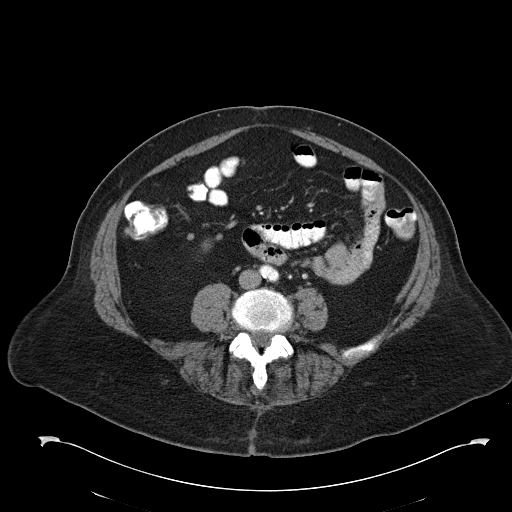
[im 52/125  soft-tissue]
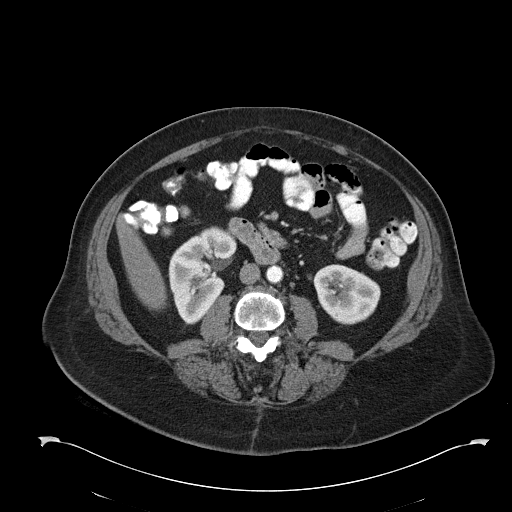
[im 66/125  soft-tissue]
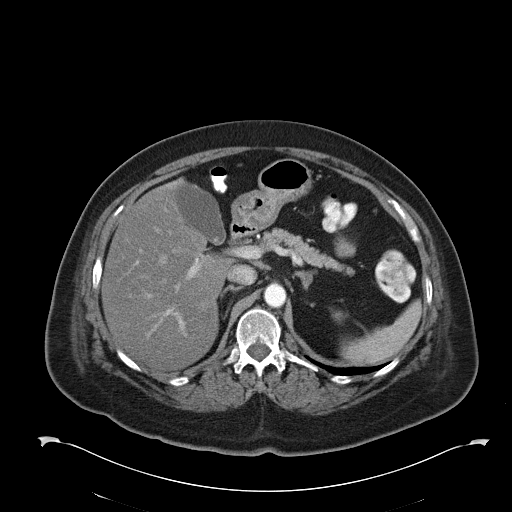
[im 73/125  soft-tissue]
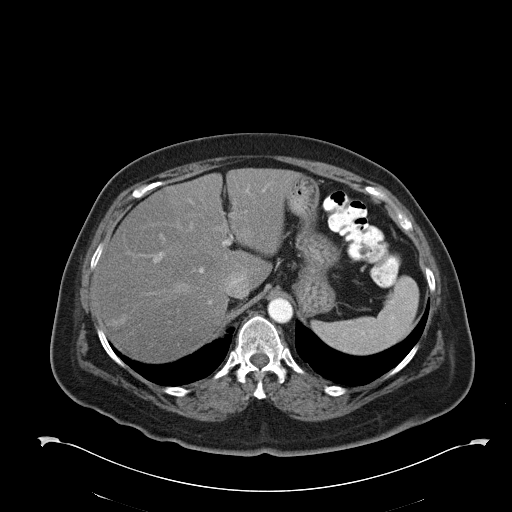
[im 81/125  soft-tissue]
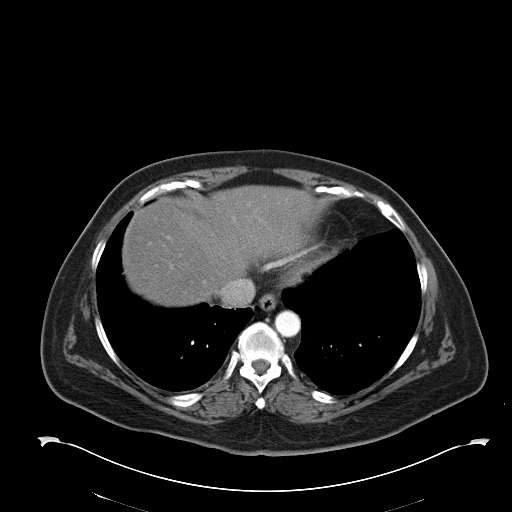
[im 81/125  bone]
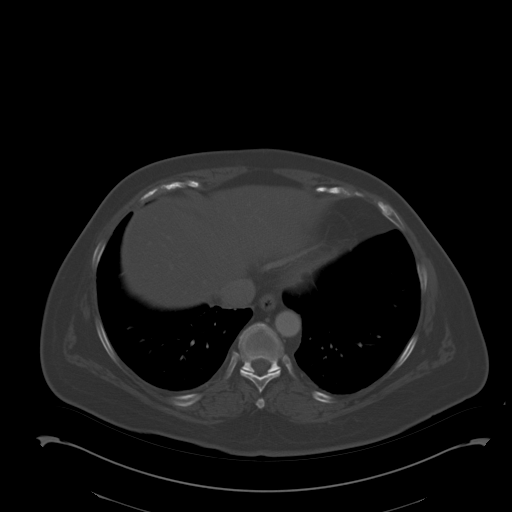
[im 88/125  soft-tissue]
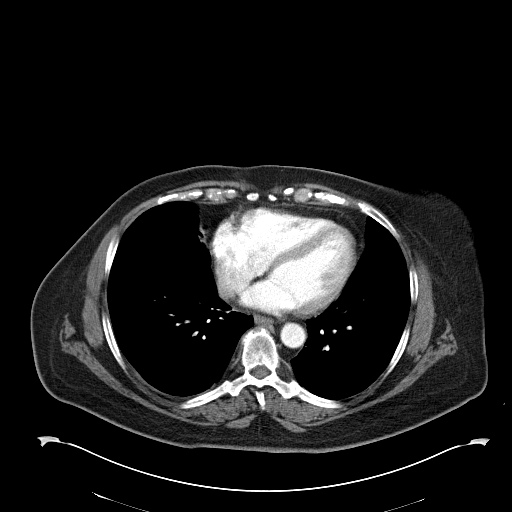
[im 95/125  soft-tissue]
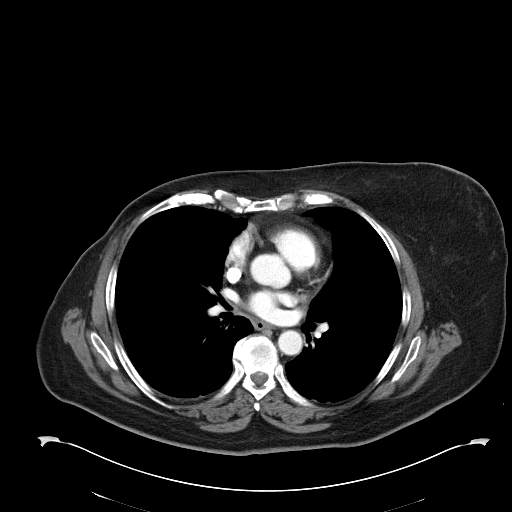
[im 110/125  soft-tissue]
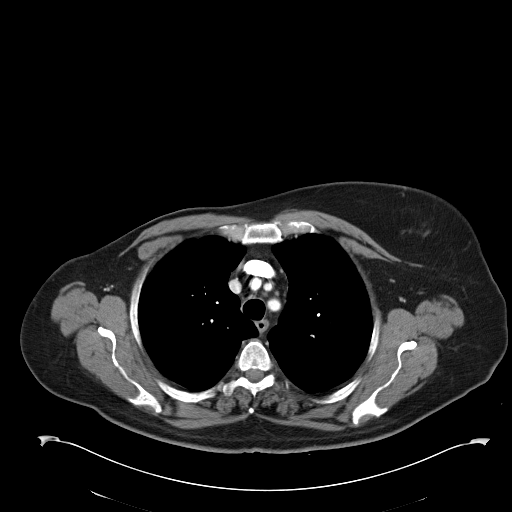
[im 117/125  soft-tissue]
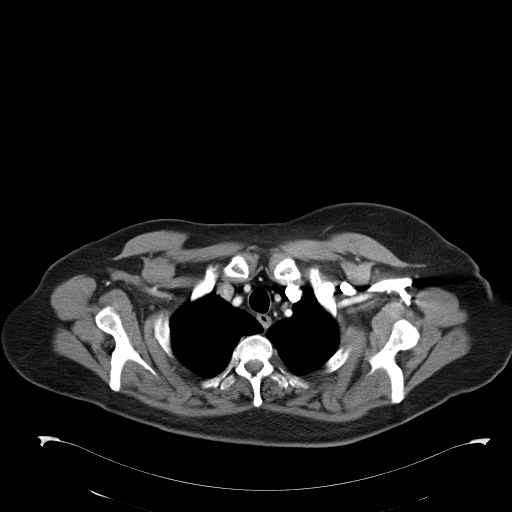

[Series 602: <mpr thick range> · coronal · 1.22mm/px · 3 of 87 slices shown]
[im 29/87  soft-tissue]
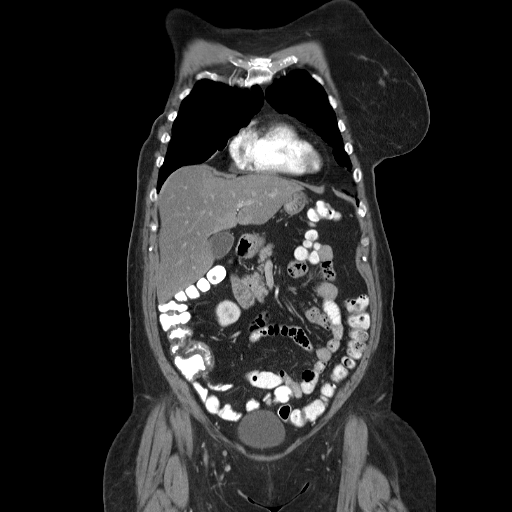
[im 39/87  soft-tissue]
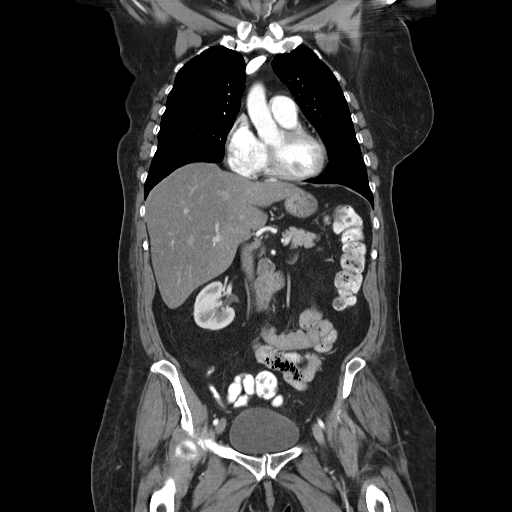
[im 48/87  soft-tissue]
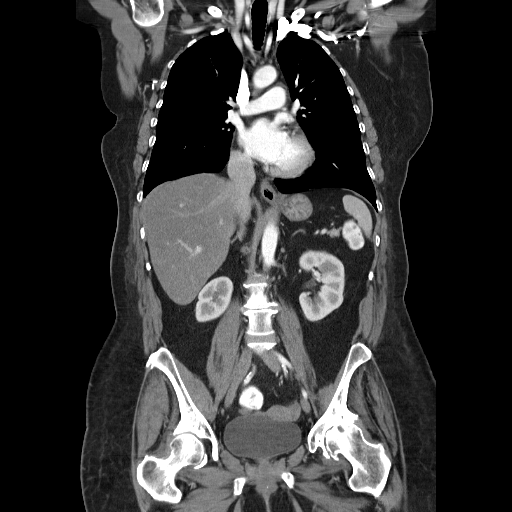

[16 of 46 positions shown; findings below may reference images not displayed]

FINDINGS: ---

RECIST

Target Lesions:

1. Right lower lobe "nodule" at 5 mm on image 29/ series 4 versus 5
mm on the prior.
2. Subcarinal node of 9 mm on image 29/ series 2 versus 8 mm on the
prior exam.
3. 8 mm right hilar node on image 27/ series 2 unchanged.
Non-target Lesions:

Similar osseous metastasis.

---

CT CHEST FINDINGS

Lungs/Pleura: Mild scarring at the right lung base. No pleural
fluid.

Heart/Mediastinum: No supraclavicular adenopathy. Right axillary
dissection and mastectomy. No axillary adenopathy. Heart size upper
normal, without pericardial effusion. Mild lipomatous hypertrophy of
the interatrial septum. No central pulmonary embolism, on this
non-dedicated study. No mediastinal or hilar adenopathy. Similar
osseous metastasis.

CT ABDOMEN AND PELVIS FINDINGS

Moderate hepatic steatosis. Vague hyperenhancement in the right lobe
of the liver which is unchanged on image 53/series 2 and
nonspecific.

Normal spleen, stomach. Transverse duodenal diverticulum. Normal
pancreas. Suspect a gallstone on image 60/ series 2. Soft tissue
fullness at the gallbladder fundus measures 1.1 cm on image 66 and
is favored to be similar. No evidence of acute cholecystitis or
biliary ductal dilatation.

Similar mild left adrenal nodularity. Normal right adrenal gland.
Normal kidneys. No retroperitoneal or retrocrural adenopathy.
Scattered colonic diverticula. Normal terminal ileum and appendix.
Normal small bowel without abdominal ascites. No evidence of omental
or peritoneal disease. No pelvic adenopathy. Normal urinary bladder
and uterus. No adnexal mass. No significant free fluid.

Bone windows demonstrate osteoarthritis of the left hip. Sclerosis
involving the medial left 11th rib on image 47/series 2. Subtle
sclerosis in the anterior right 2nd rib. Both similar. Subtle
sclerosis involving the left side of the sacrum on image 95/ series
2 and left iliac wing on image 94/series 2.
IMPRESSION: CT CHEST IMPRESSION

1. No evidence of extraosseous metastasis within the chest.
2. Similar osseous metastasis.

CT ABDOMEN AND PELVIS IMPRESSION

1. No evidence of extraosseous metastatic disease within the abdomen
or pelvis.
2. Hepatic steatosis. Similar vague hyper enhancement in the right
lobe of the liver. Favored to represent a perfusion anomaly.
3. Similar subtle left pelvic sclerosis, suspicious for osseous
metastasis.
4. Probable cholelithiasis and gallbladder adenomyomatosis.

## 2014-11-02 MED ORDER — IOHEXOL 300 MG/ML  SOLN
100.0000 mL | Freq: Once | INTRAMUSCULAR | Status: AC | PRN
  Administered 2014-11-02: 100 mL via INTRAVENOUS

## 2014-11-02 MED ORDER — IOHEXOL 300 MG/ML  SOLN
50.0000 mL | Freq: Once | INTRAMUSCULAR | Status: AC | PRN
  Administered 2014-11-02: 50 mL via ORAL

## 2014-11-03 ENCOUNTER — Other Ambulatory Visit: Payer: Self-pay | Admitting: *Deleted

## 2014-11-03 ENCOUNTER — Other Ambulatory Visit: Payer: Medicare Other

## 2014-11-03 ENCOUNTER — Other Ambulatory Visit (HOSPITAL_COMMUNITY)
Admission: RE | Admit: 2014-11-03 | Discharge: 2014-11-03 | Disposition: A | Discharge status: Home / Self Care | Payer: Medicare Other | Source: Ambulatory Visit | Attending: Oncology | Admitting: Oncology

## 2014-11-03 ENCOUNTER — Ambulatory Visit (HOSPITAL_BASED_OUTPATIENT_CLINIC_OR_DEPARTMENT_OTHER): Payer: Medicare Other | Admitting: Oncology

## 2014-11-03 ENCOUNTER — Other Ambulatory Visit (HOSPITAL_BASED_OUTPATIENT_CLINIC_OR_DEPARTMENT_OTHER): Payer: Medicare Other

## 2014-11-03 ENCOUNTER — Encounter: Payer: Self-pay | Admitting: Oncology

## 2014-11-03 ENCOUNTER — Encounter: Payer: Self-pay | Admitting: *Deleted

## 2014-11-03 VITALS — BP 121/78 | HR 71 | Temp 97.4°F | Resp 18 | Ht 65.0 in | Wt 173.2 lb

## 2014-11-03 DIAGNOSIS — C7800 Secondary malignant neoplasm of unspecified lung: Principal | ICD-10-CM

## 2014-11-03 DIAGNOSIS — C50919 Malignant neoplasm of unspecified site of unspecified female breast: Secondary | ICD-10-CM | POA: Insufficient documentation

## 2014-11-03 DIAGNOSIS — C50911 Malignant neoplasm of unspecified site of right female breast: Secondary | ICD-10-CM | POA: Diagnosis not present

## 2014-11-03 DIAGNOSIS — C7801 Secondary malignant neoplasm of right lung: Secondary | ICD-10-CM

## 2014-11-03 DIAGNOSIS — Z006 Encounter for examination for normal comparison and control in clinical research program: Secondary | ICD-10-CM | POA: Diagnosis not present

## 2014-11-03 DIAGNOSIS — J45909 Unspecified asthma, uncomplicated: Secondary | ICD-10-CM

## 2014-11-03 DIAGNOSIS — I3131 Malignant pericardial effusion in diseases classified elsewhere: Secondary | ICD-10-CM

## 2014-11-03 DIAGNOSIS — Z79811 Long term (current) use of aromatase inhibitors: Secondary | ICD-10-CM

## 2014-11-03 DIAGNOSIS — I313 Pericardial effusion (noninflammatory): Secondary | ICD-10-CM

## 2014-11-03 DIAGNOSIS — C801 Malignant (primary) neoplasm, unspecified: Secondary | ICD-10-CM

## 2014-11-03 DIAGNOSIS — M8580 Other specified disorders of bone density and structure, unspecified site: Secondary | ICD-10-CM

## 2014-11-03 LAB — LIPID PANEL
Cholesterol: 223 mg/dL — ABNORMAL HIGH (ref 0–200)
HDL: 42 mg/dL — ABNORMAL LOW (ref >=46)
LDL Cholesterol: 129 mg/dL — ABNORMAL HIGH (ref 0–99)
TRIGLYCERIDES: 258 mg/dL — AB (ref <150)
Total CHOL/HDL Ratio: 5.3 Ratio
VLDL: 52 mg/dL — AB (ref 0–40)

## 2014-11-03 LAB — PHOSPHORUS: Phosphorus: 3.2 mg/dL (ref 2.3–4.6)

## 2014-11-03 LAB — CBC WITH DIFFERENTIAL/PLATELET
BASO%: 0.7 % (ref 0.0–2.0)
BASOS ABS: 0 10*3/uL (ref 0.0–0.1)
EOS ABS: 0.1 10*3/uL (ref 0.0–0.5)
EOS%: 2 % (ref 0.0–7.0)
HEMATOCRIT: 41.3 % (ref 34.8–46.6)
HGB: 13.3 g/dL (ref 11.6–15.9)
LYMPH%: 21 % (ref 14.0–49.7)
MCH: 26 pg (ref 25.1–34.0)
MCHC: 32.2 g/dL (ref 31.5–36.0)
MCV: 80.8 fL (ref 79.5–101.0)
MONO#: 0.7 10*3/uL (ref 0.1–0.9)
MONO%: 12.8 % (ref 0.0–14.0)
NEUT%: 63.5 % (ref 38.4–76.8)
NEUTROS ABS: 3.4 10*3/uL (ref 1.5–6.5)
NRBC: 0 % (ref 0–0)
Platelets: 200 10*3/uL (ref 145–400)
RBC: 5.11 10*6/uL (ref 3.70–5.45)
RDW: 14.3 % (ref 11.2–14.5)
WBC: 5.4 10*3/uL (ref 3.9–10.3)
lymph#: 1.1 10*3/uL (ref 0.9–3.3)

## 2014-11-03 LAB — COMPREHENSIVE METABOLIC PANEL (CC13)
ALT: 28 U/L (ref 0–55)
ANION GAP: 11 meq/L (ref 3–11)
AST: 29 U/L (ref 5–34)
Albumin: 3.4 g/dL — ABNORMAL LOW (ref 3.5–5.0)
Alkaline Phosphatase: 237 U/L — ABNORMAL HIGH (ref 40–150)
BILIRUBIN TOTAL: 0.25 mg/dL (ref 0.20–1.20)
BUN: 18.2 mg/dL (ref 7.0–26.0)
CO2: 18 mEq/L — ABNORMAL LOW (ref 22–29)
CREATININE: 0.9 mg/dL (ref 0.6–1.1)
Calcium: 9.5 mg/dL (ref 8.4–10.4)
Chloride: 110 mEq/L — ABNORMAL HIGH (ref 98–109)
EGFR: 71 mL/min/{1.73_m2} — ABNORMAL LOW (ref >90)
Glucose: 141 mg/dl — ABNORMAL HIGH (ref 70–140)
Potassium: 4.1 mEq/L (ref 3.5–5.1)
SODIUM: 139 meq/L (ref 136–145)
Total Protein: 7.6 g/dL (ref 6.4–8.3)

## 2014-11-03 LAB — BILIRUBIN, DIRECT: Bilirubin, Direct: <0.1 mg/dL (ref 0.0–0.5)

## 2014-11-03 LAB — CK: Total CK: 60 U/L (ref 7–177)

## 2014-11-03 LAB — URIC ACID (CC13): URIC ACID, SERUM: 3.1 mg/dL (ref 2.6–7.4)

## 2014-11-03 NOTE — Progress Notes (Signed)
11/03/14 at 2:04pm- [employer]/ [facility] - cycle 41, day 1 study notes- The pt was into the cancer center this am for her fasting labs and cycle 41, day 1assessments. The pt confirmed that she was fasting upon arrival to the cancer center for her labs. The pt was seen and examined today by Dr. Jana Hakim. He reviewed the pt's labs today, and he felt that her abnormal lab values were "not clinically significant". Her glucose was slightly elevated today at 141. The pt's vitals and weight were stable, and her mean BP was obtained for study purposes. Dr. Jana Hakim reviewed the pt's scans, and he stated that her scans looked "great". He reviewed and signed the RECIST table with the research nurse. The pt was pleased to hear that her scan shows no evidence of progression. The pt said that she developed viral symptoms (aches/pain, nausea, diarrhea, and decreased appetite) on 10/29/14.   Dr. Jana Hakim said that all of her symptoms are related to a "viral illness", not her study drug.  She reports the following AE's as ongoing: hot flashes, memory impairment, intermittent nausea, arthritis in hands, fatigue, back pain, and bilateral knee pain. The pt denies any new medications. She reports the following concomitant medications as ongoing: Lopressor, compazine prn, immodium prn, lopid, ibuprofen prn, vitamin D3, and prilosec. She is performing all of her usual activities with some limitations. ECOG=1. The pt said that she overall feels good, but she has decided to have a hip replacement on her left hip.  The pt said that she is finally "mentally and physically prepared for the surgery". The pt has had osteoarthritis involving this hip since screening.  The research nurse will contact the study about the pt's upcoming, elective surgery.  The pt's lipid panel is pending. The pt returned her cycle 40 everolimus study drug boxes. She confirmed that she took her everolimus (10 mg) every day along with letrozole (2.5 mg) from  10/06/14 - 11/02/14 (28 days). The pt's everolimus drug boxes had 0 remaining (unopened) blister packs. The pt was dispensed 56 pills - 56 doses taken (28 days x 2 doses) = 0 returned. Therefore, the pt is 100% compliant with her study drug, everolimus, for cycle 40. The pt's everolimus was taken to the pharmacy for the drug accountability check and storage. The pt was dispensed her cycle 41 everolimus study drug boxes for self administration. The pt is aware of her May appointments.   11/03/14 at 3:44pm - The pt's lipid panel is stable.   Brion Aliment RN, BSN, CCRP  Clinical Research Nurse 11/03/2014 3:44 PM

## 2014-11-03 NOTE — Progress Notes (Signed)
[facility]  Telephone:([phone]) [phone] Fax:(336) [phone]     ID: Barbara Parks DOB: 02-27-1951  MR#: [id number]  CZY#:[id number]  Patient Care Team: Darcus Austin, MD as PCP - General (Family Medicine) Jerline Pain, MD (Cardiology) Gaye Pollack, MD (Cardiothoracic Surgery) Chauncey Cruel, MD as Consulting Physician (Oncology)  OTHER MD: Dr Erroll Luna- Merlene Laughter, DDS  CHIEF COMPLAINT: metastatic breast cancer, on BOLERO study  CURRENT TREATMENT: letrozole + everolimus  BREAST CANCER HISTORY: From Dr. Collier [name]'s original intake note 04/13/2004:  "This woman has been in good health all of her life. She recently moved from [place] to work here.  She palpated a mass at about the 12 o'clock position in mid-July. She has not noticed any nipple retraction or skin changes.  She was seen by her primary care doctor who subsequently referred her for a mammogram.  Mammogram was performed on 03/01/04. This demonstrated a spiculated 2 cm mass at the 12 o'clock position in the right breast.  Upper outer quadrant of the left breast shows some distortion.   Physical exam at that time showed a firm, nontender nodule at the 12 o'clock position in the right breast, 5 cm from the nipple.  Ultrasound of this area showed a hypoechoic ill-defined mass, measuring 2.2 x 1.3 x 1.4 cm.  Physical exam of the left breast showed general vague thickening, upper outer quadrant of the left breast with a discrete palpable mass. The ultrasound performed showed a single hypoechoic ill-defined nodule at the 1 o'clock position, measuring 7 x 5 x 6 mm.  She had biopsies of both lesions on 03/02/03.  Needle core biopsy of the lesion on the right breast revealed invasive mammary carcinoma.  Needle core biopsy of the left breast showed a complex fibroadenoma.  Prognostic panel of the lesion on the right breast showed it to be ER positive at 73%, PR positive at 90% and proliferative index 9%, HER-2 was 1+.  Patient  was referred to Dr. Annamaria Boots, who performed a simple mastectomy with sentinel lymph node evaluation on 03/22/04.  Final pathology showed this to be an invasive ductal carcinoma  with lobular features, measuring 2.2 cm, grade 2 of 3.  Margins negative for carcinoma.  Invasive ductal carcinoma was extended to involve deep dermis of the nipple.  Lymphovascular invasion was identified.  Total of 4 sentinel lymph nodes were evaluated.  Touch imprints at the time of the OR was felt to be negative.  Subsequent evaluation showed a 5 mm focus of metastatic carcinoma in one of the four lymph nodes on microscopic after sectioning.  There was extracapsular extension  of one lymph node as well. The remaining three lymph nodes were all negative."  The patient's subsequent history is detailed below.  INTERVAL HISTORY: Barbara Parks returns today for follow up of her metastatic breast cancer , accompanied by her research nurse [name]. [name] continues to participate in the [place] trial, currently on day 1 cycle 41. She continues on everolimus and letrozole. She has had no mucositis or pneumonitis symptoms. She has not had the arthralgias or myalgias that many patients can experience on letrozole. As far as the treatment is concerned she really has had no adverse events.  REVIEW OF SYSTEMS: She started feeling "bad" about 5 days ago. She did not have fever, but did have some aches and pains and then some diarrhea. This is most likely a viral illness. She does have fairly chronic back pain and significant pain involving the left  hip. She is considering having a left hip replacement sometime this summer. Her sister will be coming to town to help out. She is able to walk up stairs without stopping but does get short of breath. Her appetite is poor and she has had some nausea in the last few days but this is unusual for her and again it's likely due to the intercurrent viral illness. She does have some hot flashes. A detailed review  of systems today was otherwise stable  PAST MEDICAL HISTORY: Past Medical History  Diagnosis Date  . Asthma   . Shortness of breath   . Arthritis     left hip  . Contact dermatitis     from a bandaid that had Latex   . Osteopenia due to cancer therapy 09/09/2013  . Hypercholesteremia   . ADHD (attention deficit hyperactivity disorder)   . breast ca 2005    breast/chemo R mastectomy  . Metastasis to lung dx'd 08/2011    PAST SURGICAL HISTORY: Past Surgical History  Procedure Laterality Date  . Spine surgery  1996  . Breast surgery  2005    right  . Video bronchoscopy  09/11/2011    Procedure: VIDEO BRONCHOSCOPY;  Surgeon: Gaye Pollack, MD;  Location: [facility];  Service: Thoracic;  Laterality: N/A;  . Chest tube insertion  09/11/2011    Procedure: INSERTION PLEURAL DRAINAGE CATHETER;  Surgeon: Gaye Pollack, MD;  Location: [facility];  Service: Thoracic;  Laterality: Right;  . Pericardial window  09/11/2011    Procedure: PERICARDIAL WINDOW;  Surgeon: Gaye Pollack, MD;  Location: [facility];  Service: Thoracic;  Laterality: N/A;  . Removal of pleural drainage catheter  12/19/2011    Procedure: REMOVAL OF PLEURAL DRAINAGE CATHETER;  Surgeon: Gaye Pollack, MD;  Location: [facility];  Service: Thoracic;  Laterality: Right;  TO BE DONE IN MINOR ROOM, SHORT STAY  . Cataract extraction w/phaco Left 01/18/2014    Procedure: CATARACT EXTRACTION PHACO AND INTRAOCULAR LENS PLACEMENT (IOC);  Surgeon: Elta Guadeloupe T. Gershon Crane, MD;  Location: [facility];  Service: Ophthalmology;  Laterality: Left;  CDE 15.79  . Cataract extraction w/phaco Right 02/08/2014    Procedure: CATARACT EXTRACTION PHACO AND INTRAOCULAR LENS PLACEMENT (IOC);  Surgeon: Elta Guadeloupe T. Gershon Crane, MD;  Location: [facility];  Service: Ophthalmology;  Laterality: Right;  CDE 4.41    FAMILY HISTORY Family History  Problem Relation Age of Onset  . Cancer Mother     breast  . Heart disease Father   . Anesthesia problems Neg Hx    the patient's mother was diagnosed  with breast cancer at age 60, she died age 77 from congestive heart failure..The patient's father died from heart disease at age 73.  She has one sister alive & well.  Two brothers alive & well, one with diabetes. The patient's sister was also diagnosed with breast cancer, and was tested for the BRCA gene, and was negative. The patient herself has not been tested. There is no history of ovarian cancer in the family  GYNECOLOGIC HISTORY:  No LMP recorded. Patient is postmenopausal. Menarche age 76, the patient is GX P0. She stopped having periods with chemotherapy in 2005. 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 She was a Archivist. She is single, lives alone with her Shitzu-poodle Sammie.. Family is all in the [place] area.     ADVANCED  DIRECTIVES: Not in place. At the 02/23/2014 visit the patient was given the appropriate documents to complete and notarize at her discretion. She tells me she is planning to name her sister, Billie Ruddy, as healthcare power of attorney. [name] can be reached at [phone]   HEALTH MAINTENANCE: History  Substance Use Topics  . Smoking status: Former Smoker -- 1.50 packs/day for 30 years    Types: Cigarettes    Quit date: 09/10/2007  . Smokeless tobacco: Not on file  . Alcohol Use: No     Colonoscopy:  PAP:  Bone density: 08/11/2013, showed osteopenia  Lipid panel:  Allergies  Allergen Reactions  . Aspirin     REACTION: upset stomach  . Latex Other (See Comments)    Blistering and skin peels off  . Nsaids Nausea And Vomiting    Extreme nausea and vomiting    Current Outpatient Prescriptions  Medication Sig Dispense Refill  . cholecalciferol (VITAMIN D) 1000 UNITS tablet Take 2,000 Units by mouth daily.     Marland Kitchen gemfibrozil (LOPID) 600 MG tablet TAKE ONE TABLET BY MOUTH TWICE DAILY BEFORE A  MEAL 60 tablet 2  .  Investigational everolimus (RAD001) 5 MG tablet Novartis [id number] Take 2 tablets by mouth daily. Take with a glass of water. 70 tablet 0  . letrozole (FEMARA) 2.5 MG tablet Take 1 tablet (2.5 mg total) by mouth daily. 30 tablet 0  . loperamide (IMODIUM) 2 MG capsule Take 2 mg by mouth 4 (four) times daily as needed for diarrhea or loose stools.    Marland Kitchen LORazepam (ATIVAN) 0.5 MG tablet Take 1 tablet (0.5 mg total) by mouth every 6 (six) hours as needed (Nausea or vomiting). 30 tablet 0  . metoprolol tartrate (LOPRESSOR) 25 MG tablet Take 0.5 tablets (12.5 mg total) by mouth daily. 30 tablet 4  . omeprazole (PRILOSEC) 40 MG capsule Take 1 capsule (40 mg total) by mouth daily. 90 capsule 3  . prochlorperazine (COMPAZINE) 10 MG tablet Take 1 tablet (10 mg total) by mouth every 6 (six) hours as needed (Nausea or vomiting). 30 tablet 1   No current facility-administered medications for this visit.    OBJECTIVE: Middle-aged white woman who appears stated age 47 Vitals:   11/03/14 1006  BP: 121/78  Pulse:   Temp:   Resp:      Body mass index is 28.82 kg/(m^2).    ECOG FS:1 - Symptomatic but completely ambulatory  Sclerae unicteric, pupils equal and reactive Oropharynx clear and moist-- no thrush or other lesions No cervical or supraclavicular adenopathy Lungs no rales or rhonchi Heart regular rate and rhythm Abd soft, nontender, positive bowel sounds MSK no focal spinal tenderness, normal straight leg raising on the right, but only to 30 on the left secondary to pain. Neuro: nonfocal, well oriented, appropriate affect Breasts: The right breast is status post mastectomy. There is no evidence of local recurrence. The right axilla is benign. The left breast is unremarkable.    LAB RESULTS:  CMP     Component Value Date/Time   NA 139 11/03/2014 0938   NA 136 07/16/2012 0910   K 4.1 11/03/2014 0938   K 4.0 07/16/2012 0910   CL 109* 12/31/2012 0940   CL 103 07/16/2012 0910   CO2  18* 11/03/2014 0938   CO2 21 07/16/2012 0910   GLUCOSE 141* 11/03/2014 0938   GLUCOSE 127* 12/31/2012 0940   GLUCOSE 140* 07/16/2012 0910   BUN 18.2 11/03/2014 0938   BUN 16 07/16/2012 0910  CREATININE 0.9 11/03/2014 0938   CREATININE 0.75 07/16/2012 0910   CALCIUM 9.5 11/03/2014 0938   CALCIUM 10.1 07/16/2012 0910   PROT 7.6 11/03/2014 0938   PROT 8.0 07/16/2012 0910   ALBUMIN 3.4* 11/03/2014 0938   ALBUMIN 3.7 07/16/2012 0910   AST 29 11/03/2014 0938   AST 30 07/16/2012 0910   ALT 28 11/03/2014 0938   ALT 33 07/16/2012 0910   ALKPHOS 237* 11/03/2014 0938   ALKPHOS 142* 07/16/2012 0910   BILITOT 0.25 11/03/2014 0938   BILITOT 0.2* 07/16/2012 0910   GFRNONAA >90 09/13/2011 0440   GFRAA >90 09/13/2011 0440    I No results found for: SPEP  Lab Results  Component Value Date   WBC 5.4 11/03/2014   NEUTROABS 3.4 11/03/2014   HGB 13.3 11/03/2014   HCT 41.3 11/03/2014   MCV 80.8 11/03/2014   PLT 200 11/03/2014      Chemistry      Component Value Date/Time   NA 139 11/03/2014 0938   NA 136 07/16/2012 0910   K 4.1 11/03/2014 0938   K 4.0 07/16/2012 0910   CL 109* 12/31/2012 0940   CL 103 07/16/2012 0910   CO2 18* 11/03/2014 0938   CO2 21 07/16/2012 0910   BUN 18.2 11/03/2014 0938   BUN 16 07/16/2012 0910   CREATININE 0.9 11/03/2014 0938   CREATININE 0.75 07/16/2012 0910      Component Value Date/Time   CALCIUM 9.5 11/03/2014 0938   CALCIUM 10.1 07/16/2012 0910   ALKPHOS 237* 11/03/2014 0938   ALKPHOS 142* 07/16/2012 0910   AST 29 11/03/2014 0938   AST 30 07/16/2012 0910   ALT 28 11/03/2014 0938   ALT 33 07/16/2012 0910   BILITOT 0.25 11/03/2014 0938   BILITOT 0.2* 07/16/2012 0910       Lab Results  Component Value Date   LABCA2 58* 09/14/2012    No components found for: [id number]  No results for input(s): INR in the last 168 hours.  Urinalysis    Component Value Date/Time   COLORURINE YELLOW 09/11/2011 0819   APPEARANCEUR CLEAR 09/11/2011  0819   LABSPEC 1.020 09/08/2014 0956   LABSPEC 1.013 09/11/2011 0819   PHURINE 6.5 09/11/2011 0819   GLUCOSEU Negative 09/08/2014 0956   GLUCOSEU NEGATIVE 09/11/2011 0819   HGBUR SMALL* 09/11/2011 0819   BILIRUBINUR NEGATIVE 09/11/2011 0819   KETONESUR 15* 09/11/2011 0819   PROTEINUR NEGATIVE 09/11/2011 0819   UROBILINOGEN 0.2 09/08/2014 0956   UROBILINOGEN 0.2 09/11/2011 0819   NITRITE Negative 09/08/2014 0956   NITRITE NEGATIVE 09/11/2011 0819   LEUKOCYTESUR NEGATIVE 09/11/2011 0819    STUDIES: Ct Chest W Contrast  11/02/2014   CLINICAL DATA:  History of right mastectomy for breast cancer. Mild chest pain and chronic shortness of breath, subsequent encounter.  EXAM: CT CHEST, ABDOMEN, AND PELVIS WITH CONTRAST  TECHNIQUE: Multidetector CT imaging of the chest, abdomen and pelvis was performed following the standard protocol during bolus administration of intravenous contrast.  CONTRAST:  161m OMNIPAQUE IOHEXOL 300 MG/ML  SOLN  COMPARISON:  09/07/2014.  FINDINGS: RECIST 1.1  Target Lesions:  1. Right lower lobe pulmonary nodule, 6 mm (series 4, image 41), stable. 2. Normal size subcarinal lymph node, 6 mm (series 2, image 28), stable. 3. Normal size right hilar lymph node, 8 mm (series 2, image 28). Non-target Lesions:  None.  CT CHEST FINDINGS  Mediastinum/Nodes: No pathologically enlarged mediastinal, hilar or axillary lymph nodes. Surgical clips in the right axilla. Right mastectomy. Heart  size normal. No pericardial effusion.  Lungs/Pleura: Pleural parenchymal scarring at the posterior base of the right hemi thorax. Linear scarring in the left lower lobe. Lungs are otherwise clear. No pleural fluid. Airway is unremarkable.  Musculoskeletal: No worrisome lytic or sclerotic lesions. Degenerative changes are seen in the spine.  CT ABDOMEN AND PELVIS FINDINGS  Hepatobiliary: Liver is decreased in attenuation diffusely. Blush of contrast in the right hepatic lobe is again seen and may represent a  flash fill hemangioma. Liver and gallbladder are otherwise unremarkable. No biliary ductal dilatation.  Pancreas: Negative.  Spleen: Negative.  Adrenals/Urinary Tract: Right adrenal gland is unremarkable. Slight nodular thickening of the left adrenal gland, unchanged. Adrenal glands and kidneys are unremarkable. Ureters are decompressed. Bladder is unremarkable.  Stomach/Bowel: Stomach, small bowel, appendix and colon are unremarkable.  Vascular/Lymphatic: Atherosclerotic calcification of the arterial vasculature without abdominal aortic aneurysm. No pathologically enlarged lymph nodes.  Reproductive: Uterus and ovaries are visualized.  Other: No free fluid.  Mesenteries and peritoneum are unremarkable.  Musculoskeletal: Sclerosis in the left sacrum and left iliac wing (images 95 and 93, respectively), as before. Advanced degenerative changes in the left hip.  IMPRESSION: 1. No evidence of disease progression. Osseous metastatic disease is stable. 2. Fatty liver.   Electronically Signed   By: Lorin Picket M.D.   On: 11/02/2014 17:07   Ct Abdomen Pelvis W Contrast  11/02/2014   CLINICAL DATA:  History of right mastectomy for breast cancer. Mild chest pain and chronic shortness of breath, subsequent encounter.  EXAM: CT CHEST, ABDOMEN, AND PELVIS WITH CONTRAST  TECHNIQUE: Multidetector CT imaging of the chest, abdomen and pelvis was performed following the standard protocol during bolus administration of intravenous contrast.  CONTRAST:  128m OMNIPAQUE IOHEXOL 300 MG/ML  SOLN  COMPARISON:  09/07/2014.  FINDINGS: RECIST 1.1  Target Lesions:  1. Right lower lobe pulmonary nodule, 6 mm (series 4, image 41), stable. 2. Normal size subcarinal lymph node, 6 mm (series 2, image 28), stable. 3. Normal size right hilar lymph node, 8 mm (series 2, image 28). Non-target Lesions:  None.  CT CHEST FINDINGS  Mediastinum/Nodes: No pathologically enlarged mediastinal, hilar or axillary lymph nodes. Surgical clips in the right  axilla. Right mastectomy. Heart size normal. No pericardial effusion.  Lungs/Pleura: Pleural parenchymal scarring at the posterior base of the right hemi thorax. Linear scarring in the left lower lobe. Lungs are otherwise clear. No pleural fluid. Airway is unremarkable.  Musculoskeletal: No worrisome lytic or sclerotic lesions. Degenerative changes are seen in the spine.  CT ABDOMEN AND PELVIS FINDINGS  Hepatobiliary: Liver is decreased in attenuation diffusely. Blush of contrast in the right hepatic lobe is again seen and may represent a flash fill hemangioma. Liver and gallbladder are otherwise unremarkable. No biliary ductal dilatation.  Pancreas: Negative.  Spleen: Negative.  Adrenals/Urinary Tract: Right adrenal gland is unremarkable. Slight nodular thickening of the left adrenal gland, unchanged. Adrenal glands and kidneys are unremarkable. Ureters are decompressed. Bladder is unremarkable.  Stomach/Bowel: Stomach, small bowel, appendix and colon are unremarkable.  Vascular/Lymphatic: Atherosclerotic calcification of the arterial vasculature without abdominal aortic aneurysm. No pathologically enlarged lymph nodes.  Reproductive: Uterus and ovaries are visualized.  Other: No free fluid.  Mesenteries and peritoneum are unremarkable.  Musculoskeletal: Sclerosis in the left sacrum and left iliac wing (images 95 and 93, respectively), as before. Advanced degenerative changes in the left hip.  IMPRESSION: 1. No evidence of disease progression. Osseous metastatic disease is stable. 2. Fatty liver.  Electronically Signed   By: Lorin Picket M.D.   On: 11/02/2014 17:07      ASSESSMENT: 64 y.o. [place], Barbara Parks woman with stage IV breast cancer, on BOLERO-4 trial  (1) status post right mastectomy and sentinel lymph node sampling 03/22/2004 for a pT2 pN1, stage IIB invasive ductal carcinoma with lobular features, grade 2, estrogen receptor and progesterone receptor positive, HER-2 negative, with an MIB-1 of 9%  ([id number] and [id number])  (2) addition all right axillary lymph node sampling 05/07/2004 showed 2 benign lymph nodes (4 lymph nodes previously removed, so total was one positive lymph node out of 6; [id number])  (3) the patient was evaluated by radiation oncology; no postmastectomy radiation recommended  (4) adjuvant chemotherapy with dose dense doxorubicin and cyclophosphamide x4 cycles (first cycle delayed one week) followed by dose dense paclitaxel x4 was completed 09/18/2004  (5) tamoxifen started March 2006, discontinued 2009  METASTATIC DISEASE: (6) presenting with a large pericardial effusion, large right pleural effusion and possible right middle lobe bronchial obstruction February 2013, status post pericardial window placement, fiberoptic bronchoscopy and right Pleurx placement 09/11/2011, with biopsy of the bronchus intermedius and pericardium positive for metastatic breast cancer, estrogen receptor 91% positive with moderate staining intensity, progesterone receptor 100% positive with strong staining intensity, with an MIB-1 of 35%, and no HER-2 amplification, the signals ratio being 1.37 ([id number])  (7) enrolled in BOLERO-4 trial 10/10/2011, receiving letrozole and everolimus  (a) two small areas of enhancement in the cerebellum noted by brain MRI 10/03/2011 were no longer apparent on repeat MRI 08/21/2012-- most recent brain MRI 04/01/2014 showed no evidence of intracranial metastatic disease  (b) sclerotic lesions in left iliac bone and sacrum have not been biopsied; stable; plan is to start zolendronate after patient updates her dental care (extraction planned)  (c) RLL lung nodule, stable (rescanned 07/12/2014 and 08/11/2014) R hilar and subcarinal lymph nodes: stable  (9) additional problems:  (a) hepatic steatosis  (b) COPD/ emphysema/ asthma  (c) advanced L hip osteoarthritis  (d) aortoiliac atherosclerosis  (e) dental evaluation pending w possible dental extractions  (f)  possible thalassemia trait  (10) Bone density concerns: DEXA scan 08/11/2013 was normal   PLAN: Jennyfer is very stable as far as her breast cancer is concerned. The CT scans are very encouraging. She is tolerating the everolimus and letrozole essentially symptom free. In particular we do not see any evidence of mucositis or pneumonitis. She has not had the significant problems with arthralgias or myalgias that some patients can experience on the letrozole area  She does have some viral symptoms. Hopefully these will blow over over the next few days. In the meantime she will hydrate herself well  She is thinking about having her left hip replaced. We are placing a referral to Dr. Reynaldo Minium now so she can planned that sometime this summer at his and her discretion. Her sister will be coming to town to give her hand during recovery  We will continue to see Kalee on a monthly basis as per the [place] study. She knows to call for any problems that may develop before her next visit here.   Chauncey Cruel, MD   11/03/2014 10:47 AM

## 2014-11-04 ENCOUNTER — Encounter: Payer: Self-pay | Admitting: *Deleted

## 2014-11-04 ENCOUNTER — Telehealth: Payer: Self-pay | Admitting: Oncology

## 2014-11-04 DIAGNOSIS — C50911 Malignant neoplasm of unspecified site of right female breast: Secondary | ICD-10-CM

## 2014-11-04 DIAGNOSIS — C7800 Secondary malignant neoplasm of unspecified lung: Principal | ICD-10-CM

## 2014-11-04 MED ORDER — INV-EVEROLIMUS (RAD0001) 5MG TABLET NOVARTIS CRAD001Y24135: 2.0000 | ORAL_TABLET | Freq: Every day | ORAL | Status: DC

## 2014-11-04 NOTE — Telephone Encounter (Signed)
cld pt and gave pt info to contact orth office for appt

## 2014-11-08 ENCOUNTER — Other Ambulatory Visit: Payer: Self-pay | Admitting: Oncology

## 2014-11-21 ENCOUNTER — Ambulatory Visit
Admission: RE | Admit: 2014-11-21 | Discharge: 2014-11-21 | Disposition: A | Discharge status: Home / Self Care | Payer: Medicare Other | Source: Ambulatory Visit | Attending: Family Medicine | Admitting: Family Medicine

## 2014-11-21 ENCOUNTER — Other Ambulatory Visit: Payer: Self-pay | Admitting: Family Medicine

## 2014-11-21 DIAGNOSIS — Z853 Personal history of malignant neoplasm of breast: Secondary | ICD-10-CM

## 2014-11-21 DIAGNOSIS — M5137 Other intervertebral disc degeneration, lumbosacral region: Secondary | ICD-10-CM | POA: Diagnosis not present

## 2014-11-21 DIAGNOSIS — R52 Pain, unspecified: Secondary | ICD-10-CM

## 2014-11-21 DIAGNOSIS — M549 Dorsalgia, unspecified: Secondary | ICD-10-CM | POA: Diagnosis not present

## 2014-11-21 DIAGNOSIS — M5134 Other intervertebral disc degeneration, thoracic region: Secondary | ICD-10-CM | POA: Diagnosis not present

## 2014-11-21 DIAGNOSIS — M47816 Spondylosis without myelopathy or radiculopathy, lumbar region: Secondary | ICD-10-CM | POA: Diagnosis not present

## 2014-11-21 DIAGNOSIS — R110 Nausea: Secondary | ICD-10-CM | POA: Diagnosis not present

## 2014-11-21 DIAGNOSIS — R0781 Pleurodynia: Secondary | ICD-10-CM | POA: Diagnosis not present

## 2014-11-21 DIAGNOSIS — M4183 Other forms of scoliosis, cervicothoracic region: Secondary | ICD-10-CM | POA: Diagnosis not present

## 2014-12-01 ENCOUNTER — Ambulatory Visit: Payer: Medicare Other | Admitting: Oncology

## 2014-12-01 ENCOUNTER — Other Ambulatory Visit (HOSPITAL_COMMUNITY)
Admission: RE | Admit: 2014-12-01 | Discharge: 2014-12-01 | Disposition: A | Discharge status: Home / Self Care | Payer: Medicare Other | Source: Ambulatory Visit | Attending: Oncology | Admitting: Oncology

## 2014-12-01 ENCOUNTER — Other Ambulatory Visit: Payer: Self-pay | Admitting: *Deleted

## 2014-12-01 ENCOUNTER — Telehealth: Payer: Self-pay | Admitting: Nurse Practitioner

## 2014-12-01 ENCOUNTER — Other Ambulatory Visit: Payer: Medicare Other

## 2014-12-01 ENCOUNTER — Other Ambulatory Visit: Payer: Self-pay | Admitting: Oncology

## 2014-12-01 ENCOUNTER — Ambulatory Visit (HOSPITAL_BASED_OUTPATIENT_CLINIC_OR_DEPARTMENT_OTHER): Payer: Medicare Other | Admitting: Nurse Practitioner

## 2014-12-01 ENCOUNTER — Encounter: Payer: Self-pay | Admitting: Nurse Practitioner

## 2014-12-01 ENCOUNTER — Encounter: Payer: Self-pay | Admitting: *Deleted

## 2014-12-01 ENCOUNTER — Other Ambulatory Visit (HOSPITAL_BASED_OUTPATIENT_CLINIC_OR_DEPARTMENT_OTHER): Payer: Medicare Other

## 2014-12-01 VITALS — BP 123/60 | HR 90 | Temp 97.9°F | Resp 18 | Ht 65.0 in | Wt 171.5 lb

## 2014-12-01 DIAGNOSIS — C50911 Malignant neoplasm of unspecified site of right female breast: Secondary | ICD-10-CM

## 2014-12-01 DIAGNOSIS — C7800 Secondary malignant neoplasm of unspecified lung: Principal | ICD-10-CM

## 2014-12-01 DIAGNOSIS — C7801 Secondary malignant neoplasm of right lung: Secondary | ICD-10-CM

## 2014-12-01 DIAGNOSIS — Z006 Encounter for examination for normal comparison and control in clinical research program: Secondary | ICD-10-CM | POA: Diagnosis not present

## 2014-12-01 LAB — COMPREHENSIVE METABOLIC PANEL (CC13)
ALBUMIN: 3.5 g/dL (ref 3.5–5.0)
ALT: 36 U/L (ref 0–55)
AST: 33 U/L (ref 5–34)
Alkaline Phosphatase: 228 U/L — ABNORMAL HIGH (ref 40–150)
Anion Gap: 13 mEq/L — ABNORMAL HIGH (ref 3–11)
BUN: 18.9 mg/dL (ref 7.0–26.0)
CHLORIDE: 109 meq/L (ref 98–109)
CO2: 19 mEq/L — ABNORMAL LOW (ref 22–29)
Calcium: 10.1 mg/dL (ref 8.4–10.4)
Creatinine: 0.9 mg/dL (ref 0.6–1.1)
EGFR: 64 mL/min/{1.73_m2} — ABNORMAL LOW (ref >90)
Glucose: 153 mg/dl — ABNORMAL HIGH (ref 70–140)
POTASSIUM: 4.4 meq/L (ref 3.5–5.1)
SODIUM: 141 meq/L (ref 136–145)
Total Bilirubin: 0.29 mg/dL (ref 0.20–1.20)
Total Protein: 7.8 g/dL (ref 6.4–8.3)

## 2014-12-01 LAB — CBC WITH DIFFERENTIAL/PLATELET
BASO%: 0.5 % (ref 0.0–2.0)
Basophils Absolute: 0 10*3/uL (ref 0.0–0.1)
EOS%: 1.6 % (ref 0.0–7.0)
Eosinophils Absolute: 0.1 10*3/uL (ref 0.0–0.5)
HCT: 39.5 % (ref 34.8–46.6)
HGB: 13.3 g/dL (ref 11.6–15.9)
LYMPH#: 1.1 10*3/uL (ref 0.9–3.3)
LYMPH%: 14.2 % (ref 14.0–49.7)
MCH: 26.8 pg (ref 25.1–34.0)
MCHC: 33.7 g/dL (ref 31.5–36.0)
MCV: 79.5 fL (ref 79.5–101.0)
MONO#: 0.8 10*3/uL (ref 0.1–0.9)
MONO%: 10.7 % (ref 0.0–14.0)
NEUT#: 5.4 10*3/uL (ref 1.5–6.5)
NEUT%: 73 % (ref 38.4–76.8)
Platelets: 259 10*3/uL (ref 145–400)
RBC: 4.97 10*6/uL (ref 3.70–5.45)
RDW: 14.1 % (ref 11.2–14.5)
WBC: 7.4 10*3/uL (ref 3.9–10.3)

## 2014-12-01 LAB — LIPID PANEL
CHOLESTEROL: 244 mg/dL — AB (ref 0–200)
HDL: 48 mg/dL (ref >=46)
LDL Cholesterol: 139 mg/dL — ABNORMAL HIGH (ref 0–99)
Total CHOL/HDL Ratio: 5.1 Ratio
Triglycerides: 284 mg/dL — ABNORMAL HIGH (ref <150)
VLDL: 57 mg/dL — ABNORMAL HIGH (ref 0–40)

## 2014-12-01 LAB — PHOSPHORUS: PHOSPHORUS: 3.1 mg/dL (ref 2.5–4.6)

## 2014-12-01 LAB — CK: CK TOTAL: 75 U/L (ref 38–234)

## 2014-12-01 LAB — URIC ACID (CC13): URIC ACID, SERUM: 3.4 mg/dL (ref 2.6–7.4)

## 2014-12-01 LAB — BILIRUBIN, DIRECT: Bilirubin, Direct: <0.1 mg/dL — ABNORMAL LOW (ref 0.1–0.5)

## 2014-12-01 MED ORDER — INV-EVEROLIMUS (RAD0001) 5MG TABLET NOVARTIS CRAD001Y24135: 2.0000 | ORAL_TABLET | Freq: Every day | ORAL | Status: DC

## 2014-12-01 NOTE — Progress Notes (Signed)
Barbara Parks  [phone]) [phone] Fax:(336) [phone]     ID: Barbara Parks DOB: [birth year]  MR#: [id number]  NID#:[id number]  Patient Care Team: Darcus Austin, MD as PCP - General (Family Medicine) Jerline Pain, MD (Cardiology) Gaye Pollack, MD (Cardiothoracic Surgery) Chauncey Cruel, MD as Consulting Physician (Oncology)  OTHER MD: Dr Erroll Luna- Merlene Laughter, DDS  CHIEF COMPLAINT: metastatic breast cancer, on BOLERO study  CURRENT TREATMENT: letrozole + everolimus  BREAST CANCER HISTORY: From Dr. Collier [name]'s original intake note 04/13/2004:  "This woman has been in good health all of her life. She recently moved from [place] to work here.  She palpated a mass at about the 12 o'clock position in mid-July. She has not noticed any nipple retraction or skin changes.  She was seen by her primary care doctor who subsequently referred her for a mammogram.  Mammogram was performed on 03/01/04. This demonstrated a spiculated 2 cm mass at the 12 o'clock position in the right breast.  Upper outer quadrant of the left breast shows some distortion.   Physical exam at that time showed a firm, nontender nodule at the 12 o'clock position in the right breast, 5 cm from the nipple.  Ultrasound of this area showed a hypoechoic ill-defined mass, measuring 2.2 x 1.3 x 1.4 cm.  Physical exam of the left breast showed general vague thickening, upper outer quadrant of the left breast with a discrete palpable mass. The ultrasound performed showed a single hypoechoic ill-defined nodule at the 1 o'clock position, measuring 7 x 5 x 6 mm.  She had biopsies of both lesions on 03/02/03.  Needle core biopsy of the lesion on the right breast revealed invasive mammary carcinoma.  Needle core biopsy of the left breast showed a complex fibroadenoma.  Prognostic panel of the lesion on the right breast showed it to be ER positive at 73%, PR positive at 90% and proliferative index 9%, HER-2 was 1+.  Patient  was referred to Dr. Annamaria Boots, who performed a simple mastectomy with sentinel lymph node evaluation on 03/22/04.  Final pathology showed this to be an invasive ductal carcinoma  with lobular features, measuring 2.2 cm, grade 2 of 3.  Margins negative for carcinoma.  Invasive ductal carcinoma was extended to involve deep dermis of the nipple.  Lymphovascular invasion was identified.  Total of 4 sentinel lymph nodes were evaluated.  Touch imprints at the time of the OR was felt to be negative.  Subsequent evaluation showed a 5 mm focus of metastatic carcinoma in one of the four lymph nodes on microscopic after sectioning.  There was extracapsular extension  of one lymph node as well. The remaining three lymph nodes were all negative."  The patient's subsequent history is detailed below.  INTERVAL HISTORY: Agape returns today for follow up of her metastatic breast cancer , accompanied by her research nurse [name]. Barbara Parks continues to participate in the [place] trial, currently on cycle 42. She continues on everolimus and letrozole. She tolerates both drug well with few complaints. She has mild hot flashes at night, but no vaginal changes. She also denies mucositis or pneumonitis symptoms. Zyliah did have exacerbated right lower back and right rib pain. Her PCP ordered plain spine and right rib films, that showed degenerative changes to her thoracic spine, and mild spondylosis of her lumbar spine. She started on tylenol arthritis daily and this has not been a problem since.   REVIEW OF SYSTEMS: [name] denies fevers, chills, or changes in  bowel or bladder habits. She had some mild nausea after taking ibuprofen on an empty stomach once. She continue to have left hip discomfort and wants a hip replacement this summer, but she is having difficulty getting into Dr. Peri Maris schedule. Her appetite is improved. She is sleeping well. She denies shortness of breath, chest pain, cough, or palpitations. She has no  headaches, dizziness, or vision changes, but does have some mild hearing loss. A detailed review of systems is otherwise stable.  PAST MEDICAL HISTORY: Past Medical History  Diagnosis Date  . Asthma   . Shortness of breath   . Arthritis     left hip  . Contact dermatitis     from a bandaid that had Latex   . Osteopenia due to cancer therapy 09/09/2013  . Hypercholesteremia   . ADHD (attention deficit hyperactivity disorder)   . breast ca 2005    breast/chemo R mastectomy  . Metastasis to lung dx'd 08/2011    PAST SURGICAL HISTORY: Past Surgical History  Procedure Laterality Date  . Spine surgery  1996  . Breast surgery  2005    right  . Video bronchoscopy  09/11/2011    Procedure: VIDEO BRONCHOSCOPY;  Surgeon: Gaye Pollack, MD;  Location: [facility];  Service: Thoracic;  Laterality: N/A;  . Chest tube insertion  09/11/2011    Procedure: INSERTION PLEURAL DRAINAGE CATHETER;  Surgeon: Gaye Pollack, MD;  Location: [facility];  Service: Thoracic;  Laterality: Right;  . Pericardial window  09/11/2011    Procedure: PERICARDIAL WINDOW;  Surgeon: Gaye Pollack, MD;  Location: [facility];  Service: Thoracic;  Laterality: N/A;  . Removal of pleural drainage catheter  12/19/2011    Procedure: REMOVAL OF PLEURAL DRAINAGE CATHETER;  Surgeon: Gaye Pollack, MD;  Location: [facility];  Service: Thoracic;  Laterality: Right;  TO BE DONE IN MINOR ROOM, SHORT STAY  . Cataract extraction w/phaco Left 01/18/2014    Procedure: CATARACT EXTRACTION PHACO AND INTRAOCULAR LENS PLACEMENT (IOC);  Surgeon: Elta Guadeloupe T. Gershon Crane, MD;  Location: [facility];  Service: Ophthalmology;  Laterality: Left;  CDE 15.79  . Cataract extraction w/phaco Right 02/08/2014    Procedure: CATARACT EXTRACTION PHACO AND INTRAOCULAR LENS PLACEMENT (IOC);  Surgeon: Elta Guadeloupe T. Gershon Crane, MD;  Location: [facility];  Service: Ophthalmology;  Laterality: Right;  CDE 4.41    FAMILY HISTORY Family History  Problem Relation Age of Onset  . Cancer Mother     breast  .  Heart disease Father   . Anesthesia problems Neg Hx    the patient's mother was diagnosed with breast cancer at age 109, she died age 26 from congestive heart failure..The patient's father died from heart disease at age 35.  She has one sister alive & well.  Two brothers alive & well, one with diabetes. The patient's sister was also diagnosed with breast cancer, and was tested for the BRCA gene, and was negative. The patient herself has not been tested. There is no history of ovarian cancer in the family  GYNECOLOGIC HISTORY:  No LMP recorded. Patient is postmenopausal. Menarche age 55, the patient is GX P0. She stopped having periods with chemotherapy in 2005. 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 She was a Archivist. She is single, lives alone with her Shitzu-poodle Sammie.. Family is all in the [place] area.     ADVANCED DIRECTIVES:  Not in place. At the 02/23/2014 visit the patient was given the appropriate documents to complete and notarize at her discretion. She tells me she is planning to name her sister, Billie Ruddy, as healthcare power of attorney. [name] can be reached at [phone]   HEALTH MAINTENANCE: History  Substance Use Topics  . Smoking status: Former Smoker -- 1.50 packs/day for 30 years    Types: Cigarettes    Quit date: 09/10/2007  . Smokeless tobacco: Not on file  . Alcohol Use: No     Colonoscopy:  PAP:  Bone density: 08/11/2013, showed osteopenia  Lipid panel:  Allergies  Allergen Reactions  . Aspirin     REACTION: upset stomach  . Latex Other (See Comments)    Blistering and skin peels off  . Nsaids Nausea And Vomiting    Extreme nausea and vomiting    Current Outpatient Prescriptions  Medication Sig Dispense Refill  . cholecalciferol (VITAMIN D) 1000 UNITS tablet Take 2,000 Units by mouth daily.     Marland Kitchen gemfibrozil (LOPID) 600  MG tablet TAKE ONE TABLET BY MOUTH TWICE DAILY BEFORE A MEAL 60 tablet 0  . Investigational everolimus (RAD001) 5 MG tablet Novartis [id number] Take 2 tablets by mouth daily. Take with a glass of water. 70 tablet 0  . Investigational everolimus (RAD001) 5 MG tablet Novartis [id number] Take 2 tablets by mouth daily. Take with a glass of water. 70 tablet 0  . letrozole (FEMARA) 2.5 MG tablet Take 1 tablet (2.5 mg total) by mouth daily. 30 tablet 0  . loperamide (IMODIUM) 2 MG capsule Take 2 mg by mouth 4 (four) times daily as needed for diarrhea or loose stools.    . metoprolol tartrate (LOPRESSOR) 25 MG tablet Take 0.5 tablets (12.5 mg total) by mouth daily. 30 tablet 4  . omeprazole (PRILOSEC) 40 MG capsule Take 1 capsule (40 mg total) by mouth daily. 90 capsule 3  . prochlorperazine (COMPAZINE) 10 MG tablet Take 1 tablet (10 mg total) by mouth every 6 (six) hours as needed (Nausea or vomiting). 30 tablet 1  . LORazepam (ATIVAN) 0.5 MG tablet Take 1 tablet (0.5 mg total) by mouth every 6 (six) hours as needed (Nausea or vomiting). (Patient not taking: Reported on 12/01/2014) 30 tablet 0   No current facility-administered medications for this visit.    OBJECTIVE: Middle-aged white woman who appears stated age 7 Vitals:   12/01/14 0906  BP: 123/60  Pulse:   Temp:   Resp:      Body mass index is 28.54 kg/(m^2).    ECOG FS:1 - Symptomatic but completely ambulatory  Skin: warm, dry  HEENT: sclerae anicteric, conjunctivae pink, oropharynx clear. No thrush or mucositis.  Lymph Nodes: No cervical or supraclavicular lymphadenopathy  Lungs: clear to auscultation bilaterally, no rales, wheezes, or rhonci  Heart: regular rate and rhythm  Abdomen: round, soft, non tender, positive bowel sounds  Musculoskeletal: No focal spinal tenderness, no peripheral edema  Neuro: non focal, well oriented, positive affect  Breasts: deferred  LAB RESULTS:  CMP     Component Value Date/Time   NA 141  12/01/2014 0840   NA 136 07/16/2012 0910   K 4.4 12/01/2014 0840   K 4.0 07/16/2012 0910   CL 109* 12/31/2012 0940   CL 103 07/16/2012 0910   CO2 19* 12/01/2014 0840   CO2 21 07/16/2012 0910   GLUCOSE 153* 12/01/2014 0840   GLUCOSE 127* 12/31/2012 0940   GLUCOSE 140* 07/16/2012 0910   BUN 18.9 12/01/2014  0840   BUN 16 07/16/2012 0910   CREATININE 0.9 12/01/2014 0840   CREATININE 0.75 07/16/2012 0910   CALCIUM 10.1 12/01/2014 0840   CALCIUM 10.1 07/16/2012 0910   PROT 7.8 12/01/2014 0840   PROT 8.0 07/16/2012 0910   ALBUMIN 3.5 12/01/2014 0840   ALBUMIN 3.7 07/16/2012 0910   AST 33 12/01/2014 0840   AST 30 07/16/2012 0910   ALT 36 12/01/2014 0840   ALT 33 07/16/2012 0910   ALKPHOS 228* 12/01/2014 0840   ALKPHOS 142* 07/16/2012 0910   BILITOT 0.29 12/01/2014 0840   BILITOT 0.2* 07/16/2012 0910   GFRNONAA >90 09/13/2011 0440   GFRAA >90 09/13/2011 0440    I No results found for: SPEP  Lab Results  Component Value Date   WBC 7.4 12/01/2014   NEUTROABS 5.4 12/01/2014   HGB 13.3 12/01/2014   HCT 39.5 12/01/2014   MCV 79.5 12/01/2014   PLT 259 12/01/2014      Chemistry      Component Value Date/Time   NA 141 12/01/2014 0840   NA 136 07/16/2012 0910   K 4.4 12/01/2014 0840   K 4.0 07/16/2012 0910   CL 109* 12/31/2012 0940   CL 103 07/16/2012 0910   CO2 19* 12/01/2014 0840   CO2 21 07/16/2012 0910   BUN 18.9 12/01/2014 0840   BUN 16 07/16/2012 0910   CREATININE 0.9 12/01/2014 0840   CREATININE 0.75 07/16/2012 0910      Component Value Date/Time   CALCIUM 10.1 12/01/2014 0840   CALCIUM 10.1 07/16/2012 0910   ALKPHOS 228* 12/01/2014 0840   ALKPHOS 142* 07/16/2012 0910   AST 33 12/01/2014 0840   AST 30 07/16/2012 0910   ALT 36 12/01/2014 0840   ALT 33 07/16/2012 0910   BILITOT 0.29 12/01/2014 0840   BILITOT 0.2* 07/16/2012 0910       Lab Results  Component Value Date   LABCA2 58* 09/14/2012    No components found for: [id number]  No results for  input(s): INR in the last 168 hours.  Urinalysis    Component Value Date/Time   COLORURINE YELLOW 09/11/2011 [place] 09/11/2011 0819   LABSPEC 1.020 09/08/2014 0956   LABSPEC 1.013 09/11/2011 0819   PHURINE 6.0 09/08/2014 0956   PHURINE 6.5 09/11/2011 0819   GLUCOSEU Negative 09/08/2014 0956   GLUCOSEU NEGATIVE 09/11/2011 0819   HGBUR Large 09/08/2014 0956   HGBUR SMALL* 09/11/2011 0819   BILIRUBINUR Negative 09/08/2014 0956   BILIRUBINUR NEGATIVE 09/11/2011 0819   KETONESUR Negative 09/08/2014 0956   KETONESUR 15* 09/11/2011 0819   PROTEINUR 30 09/08/2014 0956   PROTEINUR NEGATIVE 09/11/2011 0819   UROBILINOGEN 0.2 09/08/2014 0956   UROBILINOGEN 0.2 09/11/2011 0819   NITRITE Negative 09/08/2014 0956   NITRITE NEGATIVE 09/11/2011 0819   LEUKOCYTESUR Negative 09/08/2014 0956   LEUKOCYTESUR NEGATIVE 09/11/2011 0819    STUDIES: Dg Ribs Unilateral W/chest Right  11/22/2014   CLINICAL DATA:  Low back and right lower rib pain for 1 week. No known injury. Initial encounter.  EXAM: RIGHT RIBS AND CHEST - 3+ VIEW  COMPARISON:  CT chest 11/01/2004  FINDINGS: The patient is status post right mastectomy and axillary dissection. The lungs are clear. No pneumothorax pleural effusion. Heart size is normal. No fracture or focal bony lesion is identified.  IMPRESSION: No acute abnormality or finding to explain the patient's symptoms.  Status post right mastectomy and axillary dissection.   Electronically Signed   By: Inge Rise  M.D.   On: 11/22/2014 08:33   Dg Thoracic Spine W/swimmers  11/22/2014   CLINICAL DATA:  Right-sided low back pain and right rib pain for the past week without history of trauma, history of breast malignancy status post mastectomy.  EXAM: THORACIC SPINE - 2 VIEW + SWIMMERS  COMPARISON:  Coronal and sagittal reconstructed images from a chest CT scan dated November 02, 2014  FINDINGS: The thoracic vertebral bodies are preserved in height. There is very  gentle S-shaped cervico thoracic scoliosis. There is mild degenerative disc space narrowing in the mid and lower thoracic spine. There are large right lateral endplate osteophytes from T7 through T10. The pedicles are intact. There are no abnormal paravertebral soft tissue densities.  IMPRESSION: There are mild degenerative changes of the thoracic spine. There is no acute bony abnormality.   Electronically Signed   By: David  Martinique M.D.   On: 11/22/2014 08:34   Dg Lumbar Spine Complete  11/22/2014   CLINICAL DATA:  Right-sided lumbar pain 1 week.  No trauma.  EXAM: LUMBAR SPINE - COMPLETE 4+ VIEW  COMPARISON:  CT 11/02/2014 and MRI 04/01/2014  FINDINGS: Vertebral body alignment and heights are within normal. There is mild spondylosis throughout the lumbar spine to include moderate facet arthropathy throughout the lumbar spine. There is significant disc space narrowing at the L3-4 level with minimal disc space narrowing at the L1-2, L2-3 and L5-S1 levels. No compression fracture or subluxation.  IMPRESSION: Mild spondylosis of the lumbar spine to include moderate facet arthropathy. Multilevel disc disease worse at the L3-4 level.   Electronically Signed   By: Marin Olp M.D.   On: 11/22/2014 08:34   Ct Chest W Contrast  11/02/2014   CLINICAL DATA:  History of right mastectomy for breast cancer. Mild chest pain and chronic shortness of breath, subsequent encounter.  EXAM: CT CHEST, ABDOMEN, AND PELVIS WITH CONTRAST  TECHNIQUE: Multidetector CT imaging of the chest, abdomen and pelvis was performed following the standard protocol during bolus administration of intravenous contrast.  CONTRAST:  158m OMNIPAQUE IOHEXOL 300 MG/ML  SOLN  COMPARISON:  09/07/2014.  FINDINGS: RECIST 1.1  Target Lesions:  1. Right lower lobe pulmonary nodule, 6 mm (series 4, image 41), stable. 2. Normal size subcarinal lymph node, 6 mm (series 2, image 28), stable. 3. Normal size right hilar lymph node, 8 mm (series 2, image 28).  Non-target Lesions:  None.  CT CHEST FINDINGS  Mediastinum/Nodes: No pathologically enlarged mediastinal, hilar or axillary lymph nodes. Surgical clips in the right axilla. Right mastectomy. Heart size normal. No pericardial effusion.  Lungs/Pleura: Pleural parenchymal scarring at the posterior base of the right hemi thorax. Linear scarring in the left lower lobe. Lungs are otherwise clear. No pleural fluid. Airway is unremarkable.  Musculoskeletal: No worrisome lytic or sclerotic lesions. Degenerative changes are seen in the spine.  CT ABDOMEN AND PELVIS FINDINGS  Hepatobiliary: Liver is decreased in attenuation diffusely. Blush of contrast in the right hepatic lobe is again seen and may represent a flash fill hemangioma. Liver and gallbladder are otherwise unremarkable. No biliary ductal dilatation.  Pancreas: Negative.  Spleen: Negative.  Adrenals/Urinary Tract: Right adrenal gland is unremarkable. Slight nodular thickening of the left adrenal gland, unchanged. Adrenal glands and kidneys are unremarkable. Ureters are decompressed. Bladder is unremarkable.  Stomach/Bowel: Stomach, small bowel, appendix and colon are unremarkable.  Vascular/Lymphatic: Atherosclerotic calcification of the arterial vasculature without abdominal aortic aneurysm. No pathologically enlarged lymph nodes.  Reproductive: Uterus and ovaries are visualized.  Other: No free fluid.  Mesenteries and peritoneum are unremarkable.  Musculoskeletal: Sclerosis in the left sacrum and left iliac wing (images 95 and 93, respectively), as before. Advanced degenerative changes in the left hip.  IMPRESSION: 1. No evidence of disease progression. Osseous metastatic disease is stable. 2. Fatty liver.   Electronically Signed   By: Lorin Picket M.D.   On: 11/02/2014 17:07   Ct Abdomen Pelvis W Contrast  11/02/2014   CLINICAL DATA:  History of right mastectomy for breast cancer. Mild chest pain and chronic shortness of breath, subsequent encounter.  EXAM:  CT CHEST, ABDOMEN, AND PELVIS WITH CONTRAST  TECHNIQUE: Multidetector CT imaging of the chest, abdomen and pelvis was performed following the standard protocol during bolus administration of intravenous contrast.  CONTRAST:  164mL OMNIPAQUE IOHEXOL 300 MG/ML  SOLN  COMPARISON:  09/07/2014.  FINDINGS: RECIST 1.1  Target Lesions:  1. Right lower lobe pulmonary nodule, 6 mm (series 4, image 41), stable. 2. Normal size subcarinal lymph node, 6 mm (series 2, image 28), stable. 3. Normal size right hilar lymph node, 8 mm (series 2, image 28). Non-target Lesions:  None.  CT CHEST FINDINGS  Mediastinum/Nodes: No pathologically enlarged mediastinal, hilar or axillary lymph nodes. Surgical clips in the right axilla. Right mastectomy. Heart size normal. No pericardial effusion.  Lungs/Pleura: Pleural parenchymal scarring at the posterior base of the right hemi thorax. Linear scarring in the left lower lobe. Lungs are otherwise clear. No pleural fluid. Airway is unremarkable.  Musculoskeletal: No worrisome lytic or sclerotic lesions. Degenerative changes are seen in the spine.  CT ABDOMEN AND PELVIS FINDINGS  Hepatobiliary: Liver is decreased in attenuation diffusely. Blush of contrast in the right hepatic lobe is again seen and may represent a flash fill hemangioma. Liver and gallbladder are otherwise unremarkable. No biliary ductal dilatation.  Pancreas: Negative.  Spleen: Negative.  Adrenals/Urinary Tract: Right adrenal gland is unremarkable. Slight nodular thickening of the left adrenal gland, unchanged. Adrenal glands and kidneys are unremarkable. Ureters are decompressed. Bladder is unremarkable.  Stomach/Bowel: Stomach, small bowel, appendix and colon are unremarkable.  Vascular/Lymphatic: Atherosclerotic calcification of the arterial vasculature without abdominal aortic aneurysm. No pathologically enlarged lymph nodes.  Reproductive: Uterus and ovaries are visualized.  Other: No free fluid.  Mesenteries and peritoneum  are unremarkable.  Musculoskeletal: Sclerosis in the left sacrum and left iliac wing (images 95 and 93, respectively), as before. Advanced degenerative changes in the left hip.  IMPRESSION: 1. No evidence of disease progression. Osseous metastatic disease is stable. 2. Fatty liver.   Electronically Signed   By: Lorin Picket M.D.   On: 11/02/2014 17:07      ASSESSMENT: 64 y.o. [place],  woman with stage IV breast cancer, on BOLERO-4 trial  (1) status post right mastectomy and sentinel lymph node sampling 03/22/2004 for a pT2 pN1, stage IIB invasive ductal carcinoma with lobular features, grade 2, estrogen receptor and progesterone receptor positive, HER-2 negative, with an MIB-1 of 9% ([id number] and [id number])  (2) addition all right axillary lymph node sampling 05/07/2004 showed 2 benign lymph nodes (4 lymph nodes previously removed, so total was one positive lymph node out of 6; [id number])  (3) the patient was evaluated by radiation oncology; no postmastectomy radiation recommended  (4) adjuvant chemotherapy with dose dense doxorubicin and cyclophosphamide x4 cycles (first cycle delayed one week) followed by dose dense paclitaxel x4 was completed 09/18/2004  (5) tamoxifen started March 2006, discontinued 2009  METASTATIC DISEASE: (6) presenting with a large pericardial effusion,  large right pleural effusion and possible right middle lobe bronchial obstruction February 2013, status post pericardial window placement, fiberoptic bronchoscopy and right Pleurx placement 09/11/2011, with biopsy of the bronchus intermedius and pericardium positive for metastatic breast cancer, estrogen receptor 91% positive with moderate staining intensity, progesterone receptor 100% positive with strong staining intensity, with an MIB-1 of 35%, and no HER-2 amplification, the signals ratio being 1.37 ([id number])  (7) enrolled in BOLERO-4 trial 10/10/2011, receiving letrozole and everolimus  (a) two small areas of  enhancement in the cerebellum noted by brain MRI 10/03/2011 were no longer apparent on repeat MRI 08/21/2012-- most recent brain MRI 04/01/2014 showed no evidence of intracranial metastatic disease  (b) sclerotic lesions in left iliac bone and sacrum have not been biopsied; stable; plan is to start zolendronate after patient updates her dental care (extraction planned)  (c) RLL lung nodule, stable (rescanned 07/12/2014 and 08/11/2014) R hilar and subcarinal lymph nodes: stable  (9) additional problems:  (a) hepatic steatosis  (b) COPD/ emphysema/ asthma  (c) advanced L hip osteoarthritis  (d) aortoiliac atherosclerosis  (e) dental evaluation pending w possible dental extractions  (f) possible thalassemia trait  (10) Bone density concerns: DEXA scan 08/11/2013 was normal   PLAN: Harshita is doing well today. The CBC, CMET, and uric acid were reviewed in detail and were entirely stable. She will continue with cycle 42 of letrozole and everolimus per the BOLERO-4 protocol.   She will continue the tylenol arthritis PRN for her right back pain.  Aymar will return in 4 weeks for labs and a follow up visit. Prior to this visit she will have a repeat chest CT. She understands and agrees with this plan. She knows the goal of treatment in her case is control. She has been encouraged to call with any issues that might arise before her next visit here.    Laurie Panda, NP   12/01/2014 9:30 AM

## 2014-12-01 NOTE — Progress Notes (Signed)
12/01/14 at 9:48am - [employer] - cycle 42, day 1 study notes- The pt was into the cancer center this am for her fasting labs and cycle 42, day 1assessments. The pt confirmed that she was fasting upon arrival to the cancer center for her labs. The pt was seen and examined today by Dr. Virgie Dad NP, Gentry Fitz. She reviewed the pt's labs today, and she felt that her abnormal lab values were "not clinically significant". Her glucose was slightly elevated today at 153. The pt's vitals and weight were stable, and her mean BP was obtained for study purposes. The pt said that her viral symptoms (aches/pain, nausea, diarrhea, and decreased appetite) resolved on 11/04/14. She was seen last week by her PCP for back/ rib pain.  She was told that her scans were negative, but she does have arthritis in her back.  She reports that she started Tylenol Arthritis on 11/21/14 with good relief.  The pt said that her rib pain has resolved.   She reports the following AE's as ongoing: hot flashes, memory impairment, intermittent nausea, arthritis in hands, fatigue, back pain, and bilateral knee pain. The pt stated that she took Amoxicillin 500 mg TID starting 11/07/14 for 10 days for dental prophylaxis reasons.   She reports the following concomitant medications as ongoing: Lopressor, compazine prn, immodium prn, lopid, ibuprofen prn, vitamin D3, and prilosec. She is performing all of her usual activities with some limitations. ECOG=1. The pt said that she overall feels good today.   She has not definite date for her hip surgery yet. The pt said that she has attempted several times to set up an appt with the orthopedic doctor.  The pt has had osteoarthritis involving this hip since screening. The pt's lipid panel is pending. The pt returned her cycle 41 everolimus study drug boxes. She confirmed that she took her everolimus (10 mg) every day along with letrozole (2.5 mg) from 11/03/14 - 11/30/14 (28 days). The pt's  everolimus drug boxes had 0 remaining (unopened) blister packs. The pt was dispensed 56 pills - 56 doses taken (28 days x 2 doses) = 0 returned. Therefore, the pt is 100% compliant with her study drug, everolimus, for cycle 41. The pt's everolimus was taken to the pharmacy for the drug accountability check and storage. The pt was dispensed her cycle 42 everolimus study drug boxes for self administration. The pt is aware of her May and June appointments.   12/01/14 at 3:28pm - The pt's lipid panel is stable.

## 2014-12-01 NOTE — Telephone Encounter (Signed)
Gave avs & calendar for May & June °

## 2014-12-02 IMAGING — CR DG CHEST 2V
2 series · 2 of 2 positions shown · non-contrast
Comparison: October 22, 2011.

CLINICAL DATA: Cough and wheezing.

EXAM:
CHEST  2 VIEW

[w chest pa]
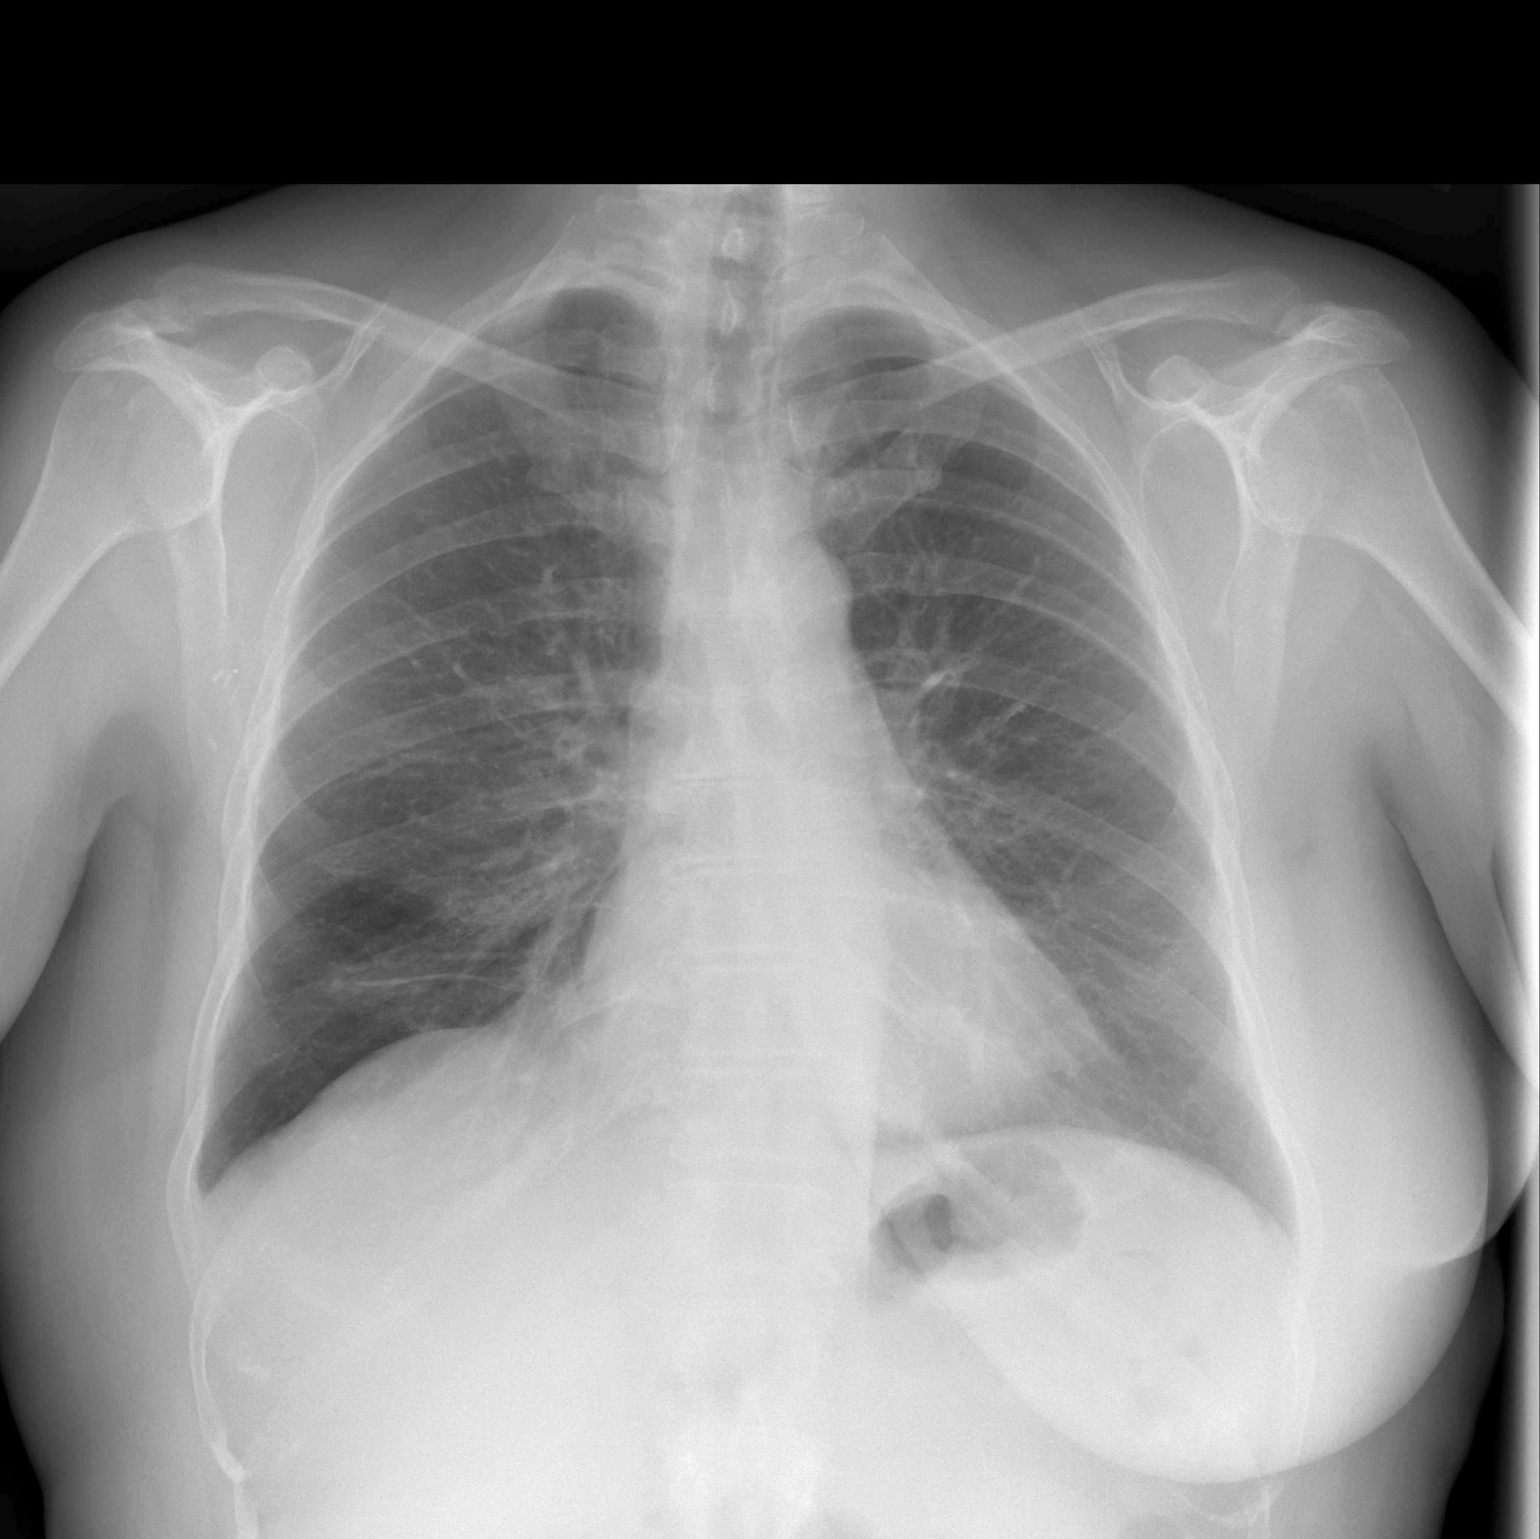

[w chest lat]
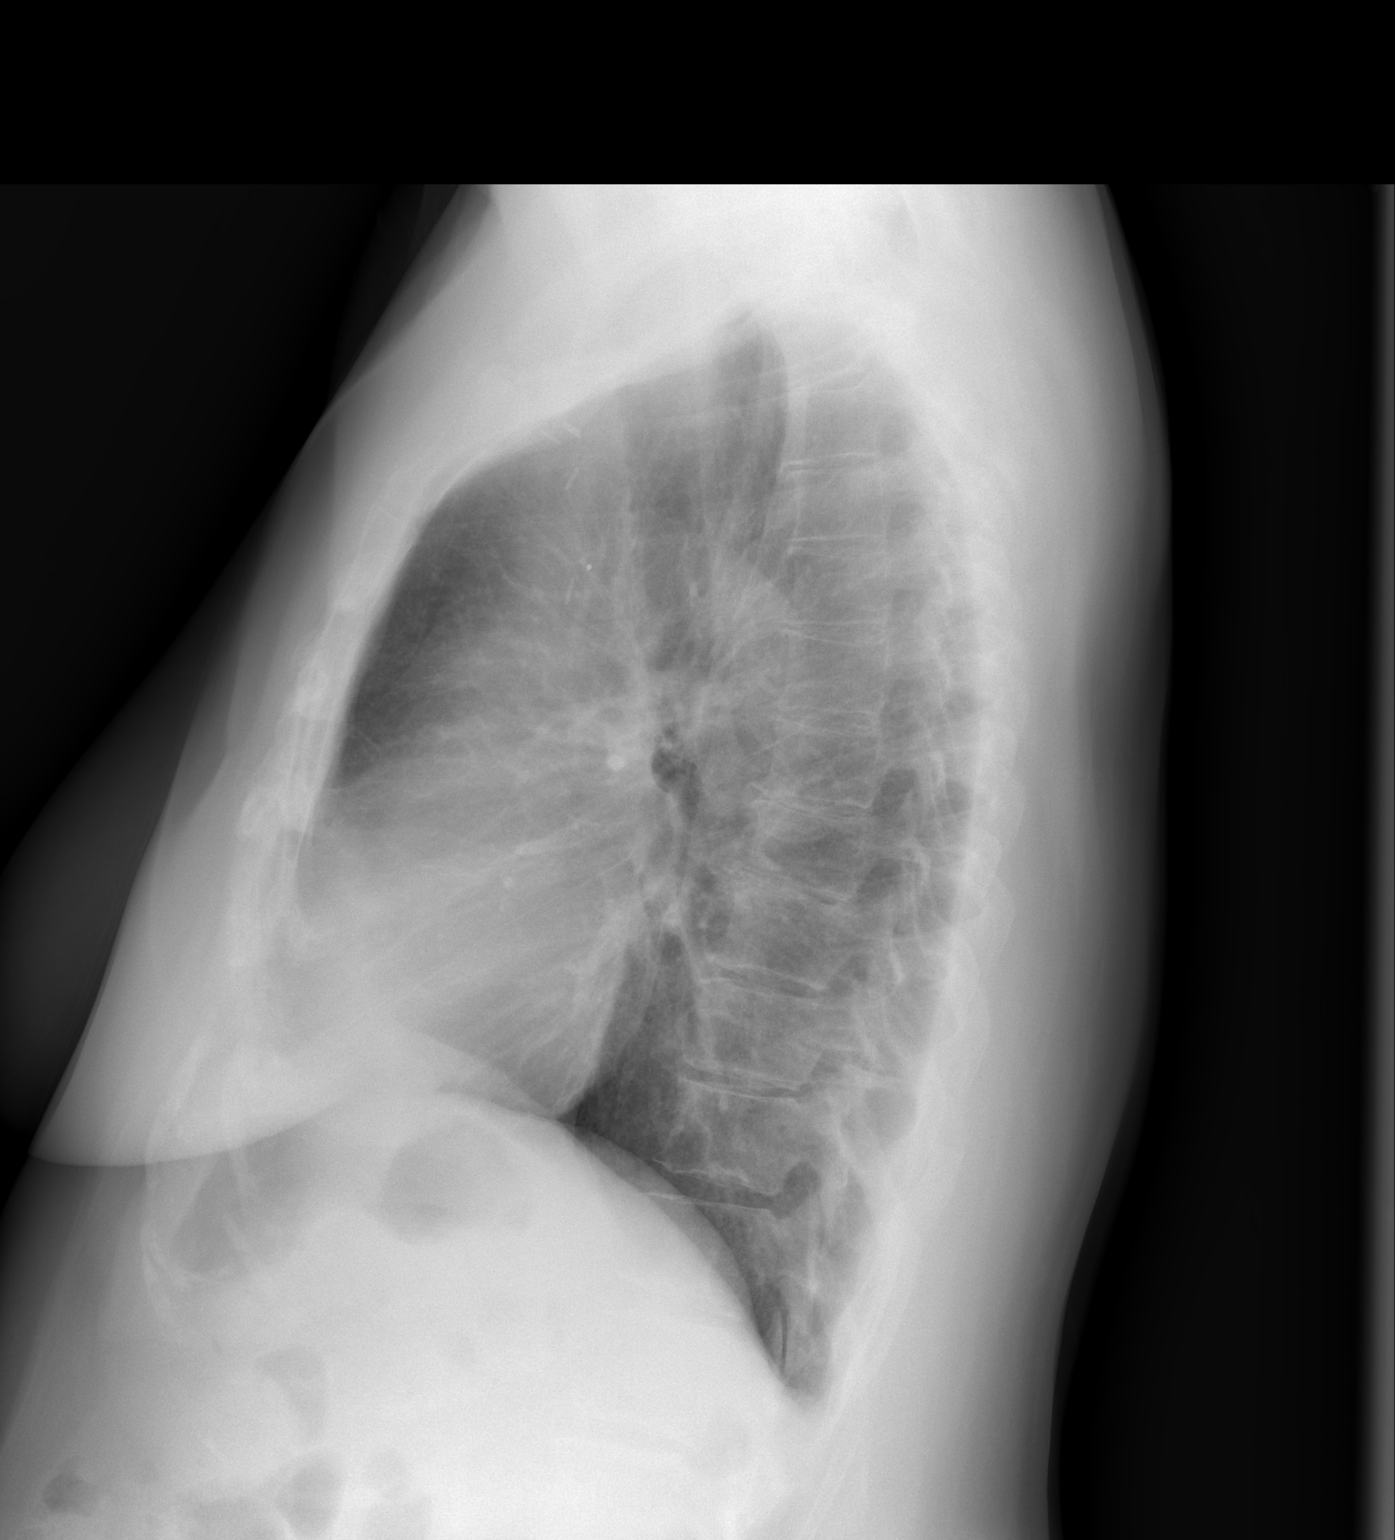

[2 of 2 positions shown; findings below may reference images not displayed]

FINDINGS: The heart size and mediastinal contours are within normal limits.
Left lung is clear. Stable linear scarring seen in right lung base.
No acute pulmonary disease is noted. The visualized skeletal
structures are unremarkable.
IMPRESSION: No active cardiopulmonary disease.

## 2014-12-10 ENCOUNTER — Other Ambulatory Visit: Payer: Self-pay | Admitting: Oncology

## 2014-12-27 ENCOUNTER — Ambulatory Visit (HOSPITAL_COMMUNITY)
Admission: RE | Admit: 2014-12-27 | Discharge: 2014-12-27 | Disposition: A | Discharge status: Home / Self Care | Payer: Medicare Other | Source: Ambulatory Visit | Attending: Oncology | Admitting: Oncology

## 2014-12-27 ENCOUNTER — Encounter: Payer: Self-pay | Admitting: *Deleted

## 2014-12-27 ENCOUNTER — Encounter (HOSPITAL_COMMUNITY): Payer: Self-pay

## 2014-12-27 ENCOUNTER — Encounter: Payer: Self-pay | Admitting: Oncology

## 2014-12-27 DIAGNOSIS — Z853 Personal history of malignant neoplasm of breast: Secondary | ICD-10-CM | POA: Insufficient documentation

## 2014-12-27 DIAGNOSIS — C7801 Secondary malignant neoplasm of right lung: Secondary | ICD-10-CM | POA: Diagnosis not present

## 2014-12-27 DIAGNOSIS — C7800 Secondary malignant neoplasm of unspecified lung: Principal | ICD-10-CM

## 2014-12-27 DIAGNOSIS — Z9011 Acquired absence of right breast and nipple: Secondary | ICD-10-CM | POA: Insufficient documentation

## 2014-12-27 DIAGNOSIS — C50911 Malignant neoplasm of unspecified site of right female breast: Secondary | ICD-10-CM

## 2014-12-27 DIAGNOSIS — K760 Fatty (change of) liver, not elsewhere classified: Secondary | ICD-10-CM | POA: Diagnosis not present

## 2014-12-27 DIAGNOSIS — R911 Solitary pulmonary nodule: Secondary | ICD-10-CM | POA: Diagnosis not present

## 2014-12-27 MED ORDER — IOHEXOL 300 MG/ML  SOLN
100.0000 mL | Freq: Once | INTRAMUSCULAR | Status: AC | PRN
  Administered 2014-12-27: 100 mL via INTRAVENOUS

## 2014-12-27 MED ORDER — IOHEXOL 300 MG/ML  SOLN
50.0000 mL | Freq: Once | INTRAMUSCULAR | Status: AC | PRN
  Administered 2014-12-27: 50 mL via ORAL

## 2014-12-28 ENCOUNTER — Telehealth: Payer: Self-pay

## 2014-12-28 ENCOUNTER — Other Ambulatory Visit: Payer: Self-pay | Admitting: *Deleted

## 2014-12-28 ENCOUNTER — Telehealth: Payer: Self-pay | Admitting: Oncology

## 2014-12-28 DIAGNOSIS — C7800 Secondary malignant neoplasm of unspecified lung: Principal | ICD-10-CM

## 2014-12-28 DIAGNOSIS — C50911 Malignant neoplasm of unspecified site of right female breast: Secondary | ICD-10-CM

## 2014-12-28 IMAGING — CT CT ABD-PELV W/ CM
1 of 2 series · 14 of 33 positions shown, 18 images · IV contrast (OMNIPAQUE)
Comparison: None.

CLINICAL DATA: Breast cancer followup

EXAM:
CT CHEST, ABDOMEN, AND PELVIS WITH CONTRAST
TECHNIQUE: Multidetector CT imaging of the chest, abdomen and pelvis was
performed following the standard protocol during bolus
administration of intravenous contrast.
CONTRAST:  50mL OMNIPAQUE IOHEXOL 300 MG/ML SOLN, 100mL OMNIPAQUE
IOHEXOL 300 MG/ML SOLN

[Series 2: cap with st · axial · 0.68mm/px · z∈[-573,-13]mm · 14 of 124 slices shown, 18 images]
[im 6/124  mediastinal]
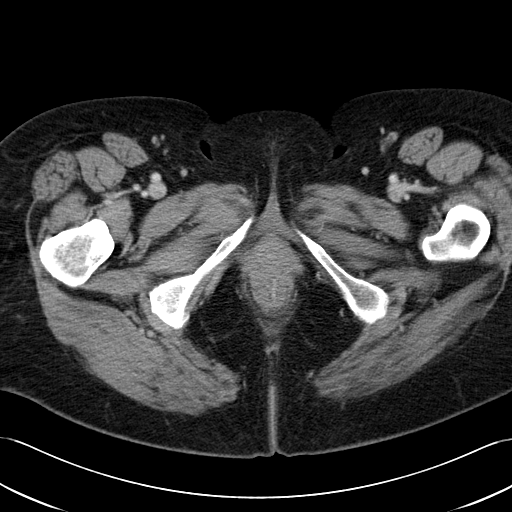
[im 6/124  lung]
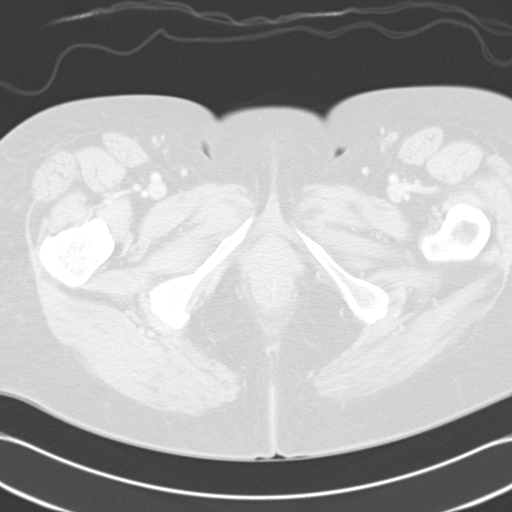
[im 18/124  lung]
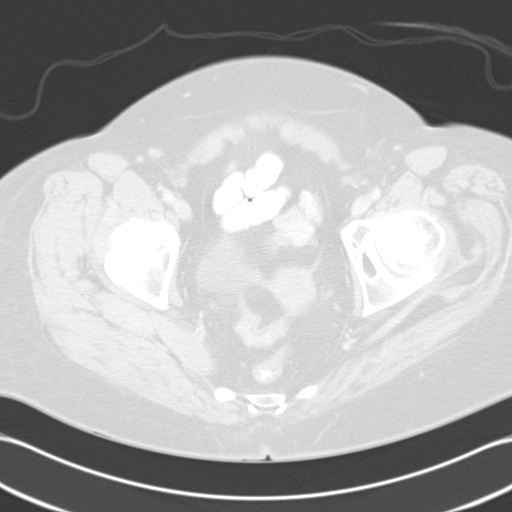
[im 30/124  lung]
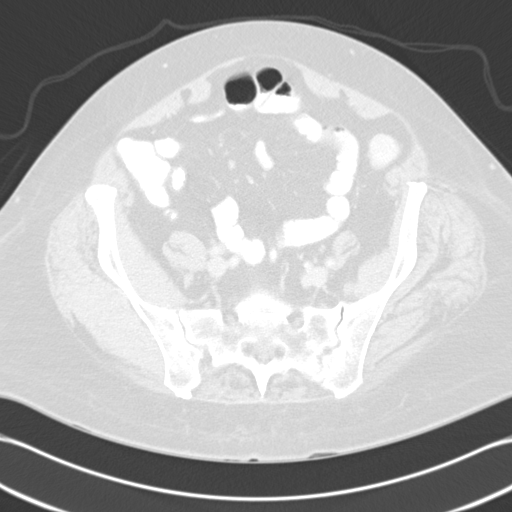
[im 36/124  lung]
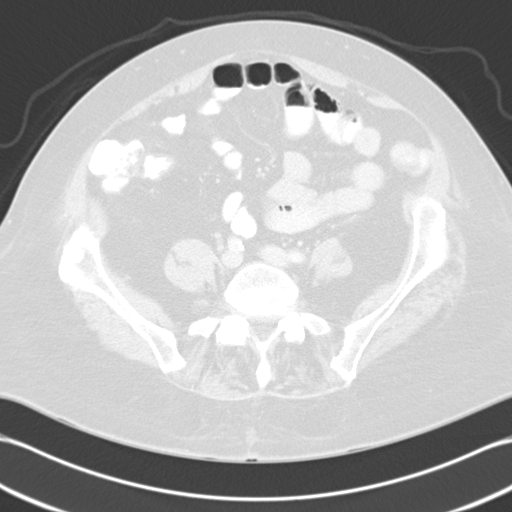
[im 42/124  mediastinal]
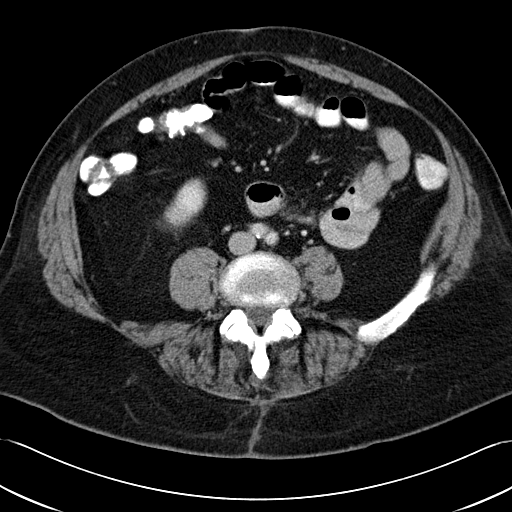
[im 42/124  lung]
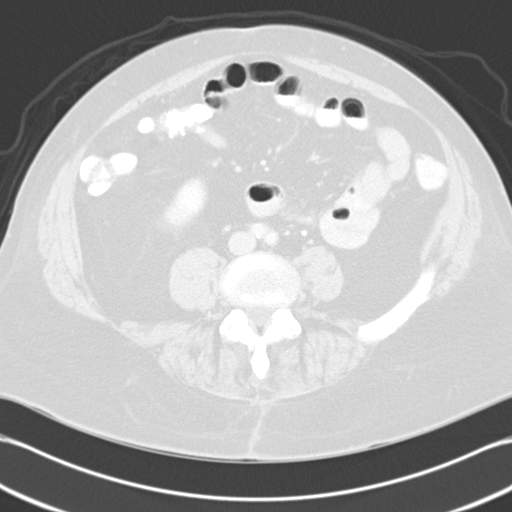
[im 53/124  lung]
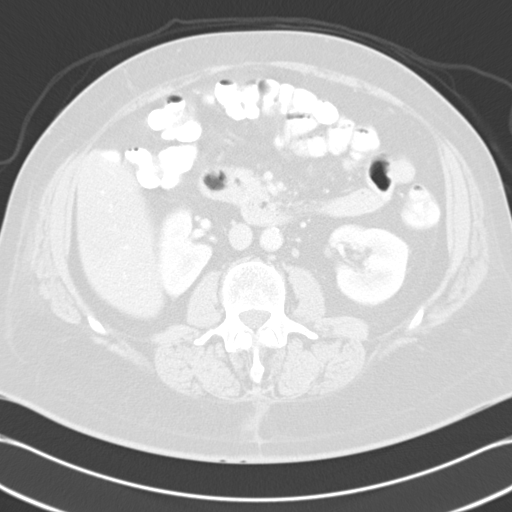
[im 61/124  lung]
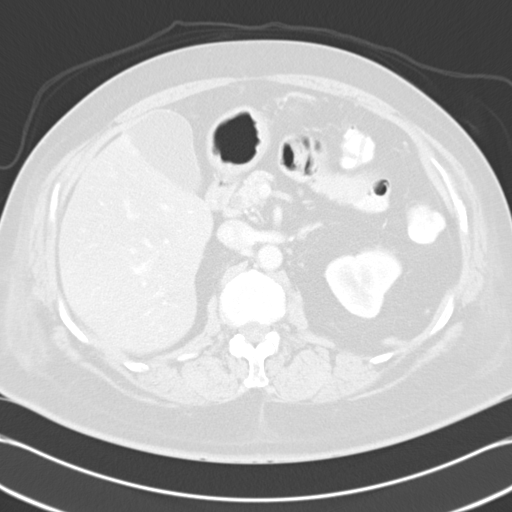
[im 62/124  lung]
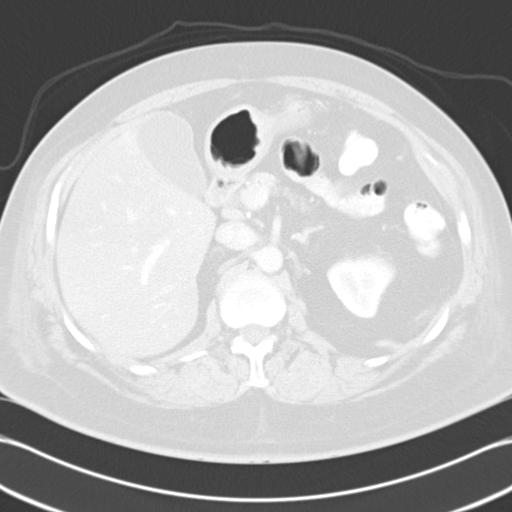
[im 71/124  mediastinal]
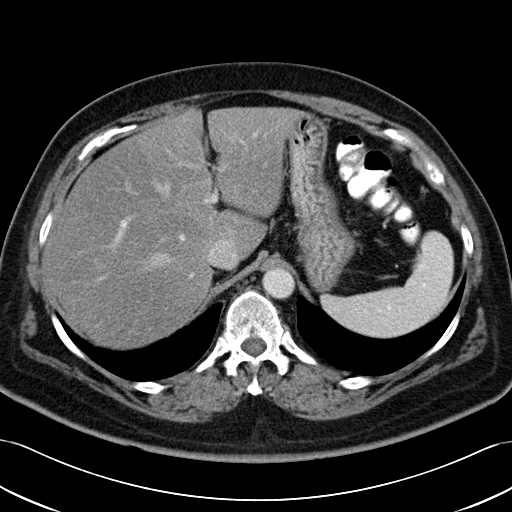
[im 71/124  lung]
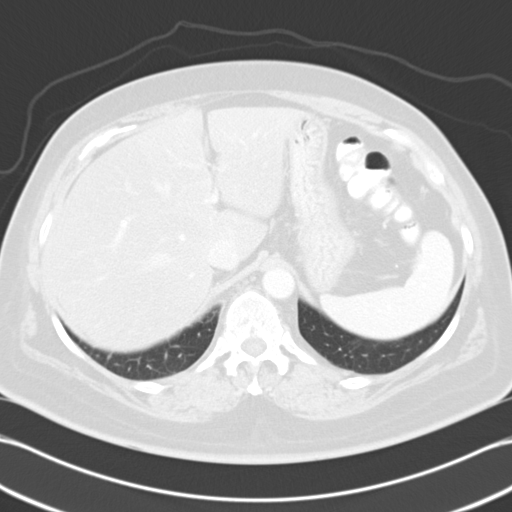
[im 83/124  lung]
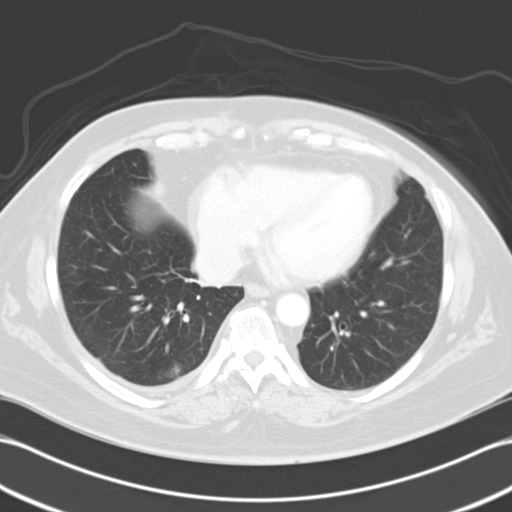
[im 93/124  lung]
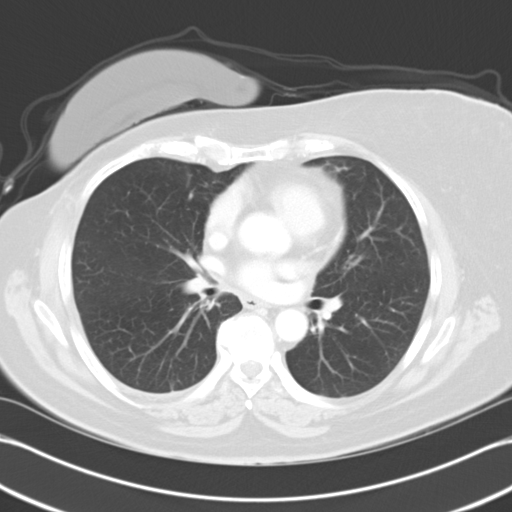
[im 94/124  lung]
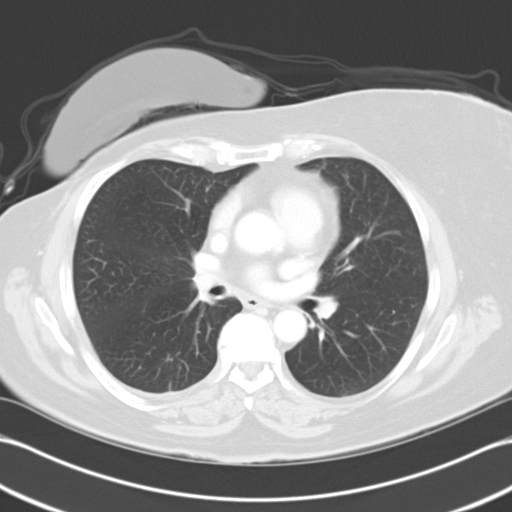
[im 106/124  mediastinal]
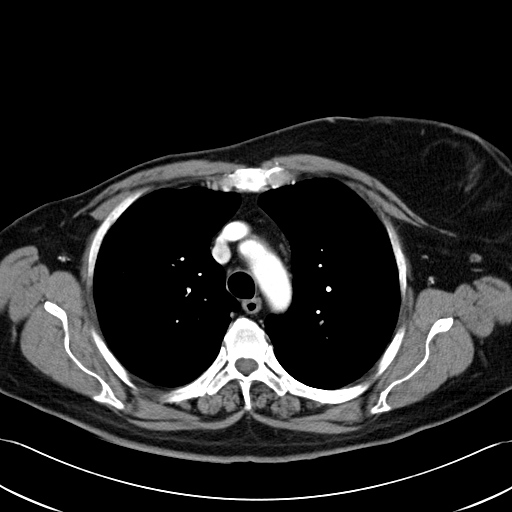
[im 106/124  lung]
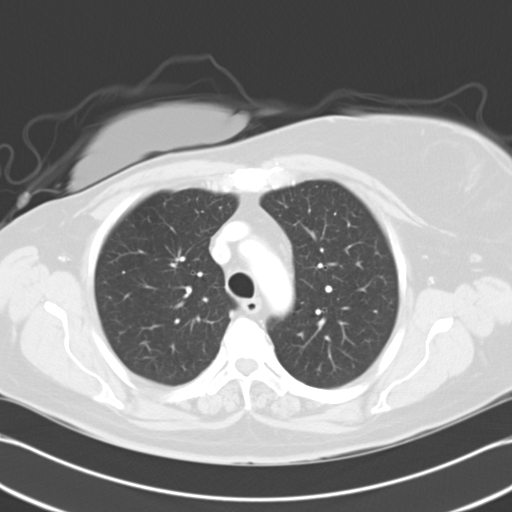
[im 118/124  lung]
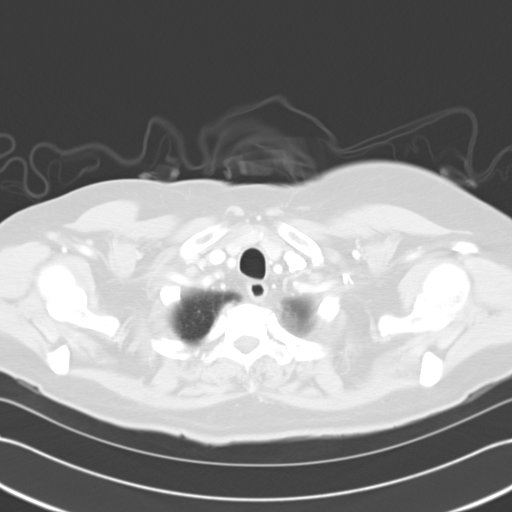

[14 of 33 positions shown; findings below may reference images not displayed]

FINDINGS: RECIST

Target Lesions:

1. Right lower lobe "nodule" at 5 mm on image 30/ series 5 versus 5
mm on the prior.
2. Subcarinal node of 10 mm on image 30/ series 2 versus 9 mm on the
prior exam.
3. 7 mm right hilar node on image 28/ series 2 versus 8 mm on the
prior exam.

Non-target Lesions:

CT CHEST FINDINGS

No pleural effusion identified. No airspace consolidation or
atelectasis noted. 5 mm right lower lobe nodule is stable from
previous examination, image 30/ series 5. No new or enlarging
pulmonary nodules or mass is identified.

The heart size appears normal. There is no pericardial effusion.
Sub- carinal lymph node measures 1 cm, image 30/ series 2.
Previously 9 mm. Right hilar lymph node measures 0.7 cm, image 28/
series 2. Previously 0.8 cm. No new or enlarging mediastinal or
hilar lymph nodes identified.

No axillary or supraclavicular lymph nodes. Previous right
mastectomy.

Review of the visualized osseous structures shows mild thoracic
spondylosis. Stable metastasis stress set stable sclerotic
metastasis involving the left 11th rib, image 48/series 2. Sclerotic
metastasis is involving the right 2nd rib is also stable, image
12/series 2.

CT ABDOMEN AND PELVIS FINDINGS

There is mild diffuse low attenuation throughout the liver
parenchyma consistent with fatty infiltration. Stable, vague area of
hyper enhancement within the right hepatic lobe, image 55/series 2.
Enhancing area of soft tissue fullness involving the gallbladder
fundus measures 1.1 cm, image 67/ series 2. This is stable from
previous exam. Small gallstone is stable. No secondary signs of
acute cholecystitis. There is no significant biliary dilatation. The
pancreas is normal. Normal appearance of the spleen.

The adrenal glands are both normal. Normal appearance of the right
kidney. The left kidney is normal. Normal appearance of the urinary
bladder. The uterus and adnexal structures are unremarkable.

Calcified atherosclerotic disease affects the abdominal aorta. No
aneurysm. No pelvic or inguinal adenopathy identified.

The stomach is normal. The small bowel loops have a normal course
and caliber. The appendix is visualized. Normal appearance of the
colon.

Review of the bone windows again demonstrates multi focal bone
metastases. Sclerotic lesion within the left sacral follow of
measures 1.9 cm, image 96/series 2. Previously 1.6 cm. Asymmetric
sclerosis involving the left iliac wing is similar to previous exam,
image 93/series 2.
IMPRESSION: 1. No acute findings. No evidence for extra osseous metastasis
within the chest.
2. Similar osseous metastasis.
3. No evidence of extraosseous metastasis within the abdomen or
pelvis.
4. Hepatic steatosis.
5. Probable gallstone and gallbladder adenomyomatosis.

## 2014-12-28 NOTE — Telephone Encounter (Signed)
Lft msg for pt confirming labs/ov per 05/31 POF, pt is to p/u schedule at next visit 06/02 and advised pt that Radiology will call her to set up CT scan's and that she will need to be fasting the day she see's Dr. Jana Hakim on 07/27.... KJ

## 2014-12-28 NOTE — Telephone Encounter (Signed)
[facility] - Calling with scan results.  "They were good".  Patient to call clinic if she has questions or needs further detail.

## 2014-12-29 ENCOUNTER — Encounter: Payer: Self-pay | Admitting: Nurse Practitioner

## 2014-12-29 ENCOUNTER — Other Ambulatory Visit (HOSPITAL_COMMUNITY)
Admission: RE | Admit: 2014-12-29 | Discharge: 2014-12-29 | Disposition: A | Discharge status: Home / Self Care | Payer: Medicare Other | Source: Ambulatory Visit | Attending: Oncology | Admitting: Oncology

## 2014-12-29 ENCOUNTER — Ambulatory Visit (HOSPITAL_BASED_OUTPATIENT_CLINIC_OR_DEPARTMENT_OTHER): Payer: Medicare Other | Admitting: Nurse Practitioner

## 2014-12-29 ENCOUNTER — Other Ambulatory Visit (HOSPITAL_BASED_OUTPATIENT_CLINIC_OR_DEPARTMENT_OTHER): Payer: Medicare Other

## 2014-12-29 ENCOUNTER — Other Ambulatory Visit: Payer: Self-pay | Admitting: *Deleted

## 2014-12-29 VITALS — BP 124/62 | HR 86 | Temp 97.7°F | Resp 18 | Ht 65.0 in | Wt 169.7 lb

## 2014-12-29 DIAGNOSIS — C7801 Secondary malignant neoplasm of right lung: Secondary | ICD-10-CM

## 2014-12-29 DIAGNOSIS — Z006 Encounter for examination for normal comparison and control in clinical research program: Secondary | ICD-10-CM

## 2014-12-29 DIAGNOSIS — Z79811 Long term (current) use of aromatase inhibitors: Secondary | ICD-10-CM

## 2014-12-29 DIAGNOSIS — C801 Malignant (primary) neoplasm, unspecified: Secondary | ICD-10-CM | POA: Diagnosis not present

## 2014-12-29 DIAGNOSIS — C50911 Malignant neoplasm of unspecified site of right female breast: Secondary | ICD-10-CM

## 2014-12-29 DIAGNOSIS — C7800 Secondary malignant neoplasm of unspecified lung: Principal | ICD-10-CM

## 2014-12-29 LAB — LIPID PANEL
CHOL/HDL RATIO: 5.1 ratio
Cholesterol: 239 mg/dL — ABNORMAL HIGH (ref 0–200)
HDL: 47 mg/dL (ref >=46)
LDL CALC: 138 mg/dL — AB (ref 0–99)
TRIGLYCERIDES: 270 mg/dL — AB (ref <150)
VLDL: 54 mg/dL — ABNORMAL HIGH (ref 0–40)

## 2014-12-29 LAB — COMPREHENSIVE METABOLIC PANEL (CC13)
ALK PHOS: 239 U/L — AB (ref 40–150)
ALT: 29 U/L (ref 0–55)
AST: 32 U/L (ref 5–34)
Albumin: 3.3 g/dL — ABNORMAL LOW (ref 3.5–5.0)
Anion Gap: 10 mEq/L (ref 3–11)
BUN: 16.1 mg/dL (ref 7.0–26.0)
CHLORIDE: 109 meq/L (ref 98–109)
CO2: 20 meq/L — AB (ref 22–29)
CREATININE: 0.9 mg/dL (ref 0.6–1.1)
Calcium: 10 mg/dL (ref 8.4–10.4)
EGFR: 69 mL/min/{1.73_m2} — ABNORMAL LOW (ref >90)
Glucose: 139 mg/dl (ref 70–140)
POTASSIUM: 3.7 meq/L (ref 3.5–5.1)
Sodium: 139 mEq/L (ref 136–145)
Total Bilirubin: 0.38 mg/dL (ref 0.20–1.20)
Total Protein: 7.7 g/dL (ref 6.4–8.3)

## 2014-12-29 LAB — URIC ACID (CC13): Uric Acid, Serum: 3.3 mg/dl (ref 2.6–7.4)

## 2014-12-29 LAB — CBC WITH DIFFERENTIAL/PLATELET
BASO%: 0.6 % (ref 0.0–2.0)
Basophils Absolute: 0 10*3/uL (ref 0.0–0.1)
EOS%: 2.3 % (ref 0.0–7.0)
Eosinophils Absolute: 0.2 10*3/uL (ref 0.0–0.5)
HCT: 39.8 % (ref 34.8–46.6)
HGB: 13.3 g/dL (ref 11.6–15.9)
LYMPH#: 1 10*3/uL (ref 0.9–3.3)
LYMPH%: 13 % — ABNORMAL LOW (ref 14.0–49.7)
MCH: 26 pg (ref 25.1–34.0)
MCHC: 33.5 g/dL (ref 31.5–36.0)
MCV: 77.7 fL — AB (ref 79.5–101.0)
MONO#: 0.9 10*3/uL (ref 0.1–0.9)
MONO%: 12 % (ref 0.0–14.0)
NEUT#: 5.5 10*3/uL (ref 1.5–6.5)
NEUT%: 72.1 % (ref 38.4–76.8)
Platelets: 283 10*3/uL (ref 145–400)
RBC: 5.12 10*6/uL (ref 3.70–5.45)
RDW: 14.1 % (ref 11.2–14.5)
WBC: 7.7 10*3/uL (ref 3.9–10.3)

## 2014-12-29 LAB — CK: CK TOTAL: 57 U/L (ref 38–234)

## 2014-12-29 LAB — PHOSPHORUS: PHOSPHORUS: 2.9 mg/dL (ref 2.5–4.6)

## 2014-12-29 LAB — BILIRUBIN, DIRECT

## 2014-12-29 MED ORDER — PROCHLORPERAZINE MALEATE 10 MG PO TABS: 10.0000 mg | ORAL_TABLET | Freq: Four times a day (QID) | ORAL | Status: DC | PRN

## 2014-12-29 MED ORDER — INV-EVEROLIMUS (RAD0001) 5MG TABLET NOVARTIS CRAD001Y24135: 2.0000 | ORAL_TABLET | Freq: Every day | ORAL | Status: DC

## 2014-12-29 NOTE — Progress Notes (Signed)
12/29/14 at 10:45am - [employer] 4, cycle 43 study notes - The pt was into the cancer center this am for her fasting labs and cycle 43, day 1assessments. The pt confirmed that she was fasting upon arrival to the cancer center for her labs. The pt was seen and examined today by Dr. Virgie Dad NP, Gentry Fitz. She reviewed the pt's labs today, and she felt that her abnormal lab values were "not clinically significant". Her glucose was normal at 139. The pt's vitals and weight were stable, and her mean BP was obtained for study purposes. The pt reports that she is trying to lose weight by eating less and moving more. She reports that she is continuing to take Tylenol Arthritis twice a week with good relief for her back pain. She reports the following AE's as ongoing: hot flashes, memory impairment, intermittent nausea, fatigue, back pain, and bilateral knee pain. The pt stated that her arthritis in her hands have resolved. She reports the following concomitant medications as ongoing: Lopressor, compazine prn, immodium prn, lopid, ibuprofen prn, vitamin D3, and prilosec. She is performing all of her usual activities with some limitations. ECOG=1. The pt said that she overall feels good today, and she states that she has been mowing and weeding her big yard. The pt also said that she looks forward to playing golf in the near future.  She has no definite date for her hip surgery yet. The pt said that she has attempted several times to set up an appt with the orthopedic doctor, but she has had a difficult time with the call center. The pt has had osteoarthritis involving the left hip since screening. The pt's lipid panel is pending. The pt returned her cycle 42 everolimus study drug boxes. She confirmed that she took her everolimus (10 mg) every day along with letrozole (2.5 mg) from 12/01/14 - 12/28/14 (28 days). The pt said that she must have missed a dose since her box had 2 pills left.  The pt  said that she cannot remember missing any doses.  The pt's everolimus drug boxes had 2 remaining (unopened) blister packs. The pt was dispensed 56 pills - 54 doses taken = 2 returned. Therefore, the pt is 96% compliant with her study drug, everolimus, for cycle 42. The pt's everolimus was taken to the pharmacy for the drug accountability check and storage. The pt was dispensed her cycle 43 everolimus study drug boxes for self administration. The pt is aware of her June and July appointments. Of note, the pt has a history of mild hearing loss since screening.  The pt does not wear a hearing aid.  The pt attributes her hearing loss to her years in the [employer] especially in "combat".    12/30/14 at 9:00am  - The pt's lipid panel is stable.

## 2014-12-29 NOTE — Progress Notes (Signed)
[facility]  [phone] Fax:([phone]) [phone]     ID: KIELEE CARE DOB: December 29, 1950  MR#: [id number]  XTG#:[id number]  Patient Care Team: Darcus Austin, MD as PCP - General (Family Medicine) Jerline Pain, MD (Cardiology) Gaye Pollack, MD (Cardiothoracic Surgery) Chauncey Cruel, MD as Consulting Physician (Oncology)  OTHER MD: Dr Erroll Luna- Merlene Laughter, DDS  CHIEF COMPLAINT: metastatic breast cancer, on BOLERO study  CURRENT TREATMENT: letrozole + everolimus  BREAST CANCER HISTORY: From Dr. Collier [name]'s original intake note 04/13/2004:  "This woman has been in good health all of her life. She recently moved from [place] to work here.  She palpated a mass at about the 12 o'clock position in mid-July. She has not noticed any nipple retraction or skin changes.  She was seen by her primary care doctor who subsequently referred her for a mammogram.  Mammogram was performed on 03/01/04. This demonstrated a spiculated 2 cm mass at the 12 o'clock position in the right breast.  Upper outer quadrant of the left breast shows some distortion.   Physical exam at that time showed a firm, nontender nodule at the 12 o'clock position in the right breast, 5 cm from the nipple.  Ultrasound of this area showed a hypoechoic ill-defined mass, measuring 2.2 x 1.3 x 1.4 cm.  Physical exam of the left breast showed general vague thickening, upper outer quadrant of the left breast with a discrete palpable mass. The ultrasound performed showed a single hypoechoic ill-defined nodule at the 1 o'clock position, measuring 7 x 5 x 6 mm.  She had biopsies of both lesions on 03/02/03.  Needle core biopsy of the lesion on the right breast revealed invasive mammary carcinoma.  Needle core biopsy of the left breast showed a complex fibroadenoma.  Prognostic panel of the lesion on the right breast showed it to be ER positive at 73%, PR positive at 90% and proliferative index 9%, HER-2 was 1+.  Patient  was referred to Dr. Annamaria Boots, who performed a simple mastectomy with sentinel lymph node evaluation on 03/22/04.  Final pathology showed this to be an invasive ductal carcinoma  with lobular features, measuring 2.2 cm, grade 2 of 3.  Margins negative for carcinoma.  Invasive ductal carcinoma was extended to involve deep dermis of the nipple.  Lymphovascular invasion was identified.  Total of 4 sentinel lymph nodes were evaluated.  Touch imprints at the time of the OR was felt to be negative.  Subsequent evaluation showed a 5 mm focus of metastatic carcinoma in one of the four lymph nodes on microscopic after sectioning.  There was extracapsular extension  of one lymph node as well. The remaining three lymph nodes were all negative."  The patient's subsequent history is detailed below.  INTERVAL HISTORY: Meerab returns today for follow up of her metastatic breast cancer , accompanied by her research nurse [name]. Birtie continues to participate in the [place] trial, currently on cycle 43. She continues on everolimus and letrozole. She tolerates both drug well with few complaints. She has mild hot flashes at night, but no vaginal changes. She also denies mucositis or pneumonitis symptoms.  REVIEW OF SYSTEMS: Mardelle denies fevers or chills. She has been moving her bowels well lately. She has mild nausea with big meals, but otherwise eats well. She has been able to be more active in her yard and garden and has lost a few more pounds. She is rightfully proud of herself. She continues to have difficulty getting into  Dr. Peri Maris schedule to discuss a left hip replacement. Fortunately the movement has helped her hip some. She is rarely taking tylenol arthritis now. She denies shortness of breath, chest pain, cough, or palpitations. She has no headaches, dizziness, or vision changes, but does have some mild hearing loss. A detailed review of systems is otherwise stable.  PAST MEDICAL HISTORY: Past Medical  History  Diagnosis Date  . Asthma   . Shortness of breath   . Arthritis     left hip  . Contact dermatitis     from a bandaid that had Latex   . Osteopenia due to cancer therapy 09/09/2013  . Hypercholesteremia   . ADHD (attention deficit hyperactivity disorder)   . breast ca 2005    breast/chemo R mastectomy  . Metastasis to lung dx'd 08/2011    PAST SURGICAL HISTORY: Past Surgical History  Procedure Laterality Date  . Spine surgery  1996  . Breast surgery  2005    right  . Video bronchoscopy  09/11/2011    Procedure: VIDEO BRONCHOSCOPY;  Surgeon: Gaye Pollack, MD;  Location: [facility];  Service: Thoracic;  Laterality: N/A;  . Chest tube insertion  09/11/2011    Procedure: INSERTION PLEURAL DRAINAGE CATHETER;  Surgeon: Gaye Pollack, MD;  Location: [facility];  Service: Thoracic;  Laterality: Right;  . Pericardial window  09/11/2011    Procedure: PERICARDIAL WINDOW;  Surgeon: Gaye Pollack, MD;  Location: [facility];  Service: Thoracic;  Laterality: N/A;  . Removal of pleural drainage catheter  12/19/2011    Procedure: REMOVAL OF PLEURAL DRAINAGE CATHETER;  Surgeon: Gaye Pollack, MD;  Location: [facility];  Service: Thoracic;  Laterality: Right;  TO BE DONE IN MINOR ROOM, SHORT STAY  . Cataract extraction w/phaco Left 01/18/2014    Procedure: CATARACT EXTRACTION PHACO AND INTRAOCULAR LENS PLACEMENT (IOC);  Surgeon: Elta Guadeloupe T. Gershon Crane, MD;  Location: [facility];  Service: Ophthalmology;  Laterality: Left;  CDE 15.79  . Cataract extraction w/phaco Right 02/08/2014    Procedure: CATARACT EXTRACTION PHACO AND INTRAOCULAR LENS PLACEMENT (IOC);  Surgeon: Elta Guadeloupe T. Gershon Crane, MD;  Location: [facility];  Service: Ophthalmology;  Laterality: Right;  CDE 4.41    FAMILY HISTORY Family History  Problem Relation Age of Onset  . Cancer Mother     breast  . Heart disease Father   . Anesthesia problems Neg Hx    the patient's mother was diagnosed with breast cancer at age 39, she died age 12 from congestive heart  failure..The patient's father died from heart disease at age 85.  She has one sister alive & well.  Two brothers alive & well, one with diabetes. The patient's sister was also diagnosed with breast cancer, and was tested for the BRCA gene, and was negative. The patient herself has not been tested. There is no history of ovarian cancer in the family  GYNECOLOGIC HISTORY:  No LMP recorded. Patient is postmenopausal. Menarche age 72, the patient is GX P0. She stopped having periods with chemotherapy in 2005. 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 She was a Archivist. She is single, lives alone with her Shitzu-poodle Sammie.. Family is all in the [place] area.     ADVANCED DIRECTIVES: Not in place. At the 02/23/2014 visit the patient was given the appropriate documents to complete and notarize at her discretion. She tells me she  is planning to name her sister, Billie Ruddy, as healthcare power of attorney. [name] can be reached at [phone]   HEALTH MAINTENANCE: History  Substance Use Topics  . Smoking status: Former Smoker -- 1.50 packs/day for 30 years    Types: Cigarettes    Quit date: 09/10/2007  . Smokeless tobacco: Not on file  . Alcohol Use: No     Colonoscopy:  PAP:  Bone density: 08/11/2013, showed osteopenia  Lipid panel:  Allergies  Allergen Reactions  . Aspirin     REACTION: upset stomach  . Latex Other (See Comments)    Blistering and skin peels off  . Nsaids Nausea And Vomiting    Extreme nausea and vomiting    Current Outpatient Prescriptions  Medication Sig Dispense Refill  . cholecalciferol (VITAMIN D) 1000 UNITS tablet Take 2,000 Units by mouth daily.     Marland Kitchen gemfibrozil (LOPID) 600 MG tablet TAKE ONE TABLET BY MOUTH TWICE DAILY BEFORE A MEAL 60 tablet 3  . Investigational everolimus (RAD001) 5 MG tablet Novartis [id number] Take 2  tablets by mouth daily. Take with a glass of water. 70 tablet 0  . letrozole (FEMARA) 2.5 MG tablet Take 1 tablet (2.5 mg total) by mouth daily. 30 tablet 0  . metoprolol tartrate (LOPRESSOR) 25 MG tablet Take 0.5 tablets (12.5 mg total) by mouth daily. 30 tablet 4  . omeprazole (PRILOSEC) 40 MG capsule Take 1 capsule (40 mg total) by mouth daily. 90 capsule 3  . loperamide (IMODIUM) 2 MG capsule Take 2 mg by mouth 4 (four) times daily as needed for diarrhea or loose stools.    . prochlorperazine (COMPAZINE) 10 MG tablet Take 1 tablet (10 mg total) by mouth every 6 (six) hours as needed (Nausea or vomiting). (Patient not taking: Reported on 12/29/2014) 30 tablet 1   No current facility-administered medications for this visit.    OBJECTIVE: Middle-aged white woman who appears stated age 64 Vitals:   12/29/14 0851  BP: 124/62  Pulse:   Temp:   Resp:      Body mass index is 28.24 kg/(m^2).    ECOG FS:1 - Symptomatic but completely ambulatory  Skin: warm, dry  HEENT: sclerae anicteric, conjunctivae pink, oropharynx clear. No thrush or mucositis.  Lymph Nodes: No cervical or supraclavicular lymphadenopathy  Lungs: clear to auscultation bilaterally, no rales, wheezes, or rhonci  Heart: regular rate and rhythm  Abdomen: round, soft, non tender, positive bowel sounds  Musculoskeletal: No focal spinal tenderness, no peripheral edema  Neuro: non focal, well oriented, positive affect  Breasts: deferred  LAB RESULTS:  CMP     Component Value Date/Time   NA 139 12/29/2014 0828   NA 136 07/16/2012 0910   K 3.7 12/29/2014 0828   K 4.0 07/16/2012 0910   CL 109* 12/31/2012 0940   CL 103 07/16/2012 0910   CO2 20* 12/29/2014 0828   CO2 21 07/16/2012 0910   GLUCOSE 139 12/29/2014 0828   GLUCOSE 127* 12/31/2012 0940   GLUCOSE 140* 07/16/2012 0910   BUN 16.1 12/29/2014 0828   BUN 16 07/16/2012 0910   CREATININE 0.9 12/29/2014 0828   CREATININE 0.75 07/16/2012 0910   CALCIUM 10.0  12/29/2014 0828   CALCIUM 10.1 07/16/2012 0910   PROT 7.7 12/29/2014 0828   PROT 8.0 07/16/2012 0910   ALBUMIN 3.3* 12/29/2014 0828   ALBUMIN 3.7 07/16/2012 0910   AST 32 12/29/2014 0828   AST 30 07/16/2012 0910   ALT 29 12/29/2014 0828   ALT 33  07/16/2012 0910   ALKPHOS 239* 12/29/2014 0828   ALKPHOS 142* 07/16/2012 0910   BILITOT 0.38 12/29/2014 0828   BILITOT 0.2* 07/16/2012 0910   GFRNONAA >90 09/13/2011 0440   GFRAA >90 09/13/2011 0440    I No results found for: SPEP  Lab Results  Component Value Date   WBC 7.7 12/29/2014   NEUTROABS 5.5 12/29/2014   HGB 13.3 12/29/2014   HCT 39.8 12/29/2014   MCV 77.7* 12/29/2014   PLT 283 12/29/2014      Chemistry      Component Value Date/Time   NA 139 12/29/2014 0828   NA 136 07/16/2012 0910   K 3.7 12/29/2014 0828   K 4.0 07/16/2012 0910   CL 109* 12/31/2012 0940   CL 103 07/16/2012 0910   CO2 20* 12/29/2014 0828   CO2 21 07/16/2012 0910   BUN 16.1 12/29/2014 0828   BUN 16 07/16/2012 0910   CREATININE 0.9 12/29/2014 0828   CREATININE 0.75 07/16/2012 0910      Component Value Date/Time   CALCIUM 10.0 12/29/2014 0828   CALCIUM 10.1 07/16/2012 0910   ALKPHOS 239* 12/29/2014 0828   ALKPHOS 142* 07/16/2012 0910   AST 32 12/29/2014 0828   AST 30 07/16/2012 0910   ALT 29 12/29/2014 0828   ALT 33 07/16/2012 0910   BILITOT 0.38 12/29/2014 0828   BILITOT 0.2* 07/16/2012 0910       Lab Results  Component Value Date   LABCA2 58* 09/14/2012    No components found for: [id number]  No results for input(s): INR in the last 168 hours.  Urinalysis    Component Value Date/Time   COLORURINE YELLOW 09/11/2011 0819   APPEARANCEUR CLEAR 09/11/2011 0819   LABSPEC 1.020 09/08/2014 0956   LABSPEC 1.013 09/11/2011 0819   PHURINE 6.0 09/08/2014 0956   PHURINE 6.5 09/11/2011 0819   GLUCOSEU Negative 09/08/2014 0956   GLUCOSEU NEGATIVE 09/11/2011 0819   HGBUR Large 09/08/2014 0956   HGBUR SMALL* 09/11/2011 0819    BILIRUBINUR Negative 09/08/2014 0956   BILIRUBINUR NEGATIVE 09/11/2011 0819   KETONESUR Negative 09/08/2014 0956   KETONESUR 15* 09/11/2011 0819   PROTEINUR 30 09/08/2014 0956   PROTEINUR NEGATIVE 09/11/2011 0819   UROBILINOGEN 0.2 09/08/2014 0956   UROBILINOGEN 0.2 09/11/2011 0819   NITRITE Negative 09/08/2014 0956   NITRITE NEGATIVE 09/11/2011 0819   LEUKOCYTESUR Negative 09/08/2014 0956   LEUKOCYTESUR NEGATIVE 09/11/2011 0819    STUDIES: Ct Chest W Contrast  12/27/2014   CLINICAL DATA:  History of right mastectomy for breast cancer. Recist protocol.  EXAM: CT CHEST, ABDOMEN AND PELVIS WITH CONTRAST  TECHNIQUE: Multidetector CT imaging of the chest, abdomen and pelvis was performed using the standard protocol following bolus administration of intravenous contrast.  CONTRAST:  175m OMNIPAQUE IOHEXOL 300 MG/ML  SOLN  COMPARISON:  11/21/2014  FINDINGS: Recist 1.1  Target lesions:  1. Vague semi-solid pulmonary nodule in the right lower lobe on image number 38 measures 6 mm. No change. 2. Subcarinal calcified lymph node on image number 27 measures 6 mm. No change. 3. Right hilar lymph node on image number 27 measures 8 mm. No change. Non target lesions:  None.  Chest:  Stable surgical changes from a right mastectomy. The left breast appears normal. No mass or adenopathy. No supraclavicular or axillary adenopathy.  The heart is normal in size. No pericardial effusion. Small mediastinal and hilar lymph nodes as above. No new adenopathy. The aorta is normal and stable. The esophagus is normal.  Stable vague 6 mm right lower lobe pulmonary nodule as above. No new pulmonary lesions. No acute pulmonary findings. No pleural effusion.  Abdomen/pelvis:  Diffuse fatty infiltration of the liver is again demonstrated with a probable small vascular shunt noted in segment 8. No worrisome hepatic lesions to suggest metastatic disease. Focal fatty sparing near the gallbladder fossa is noted. The gallbladder  demonstrates a focal area of slight nodularity which could represent focal adenoma I Oma. Possible gallstones. No common bile duct dilatation.  The pancreas demonstrates chronic atrophy but no acute findings.  The spleen is normal in size.  No focal lesions.  The adrenal glands and kidneys are stable. There is a stable small adenoma associated with the left adrenal gland unchanged since 2014. No worrisome renal lesions or hydronephrosis.  The stomach, duodenum, small bowel and colon are unremarkable. No inflammatory changes, mass lesions or obstructive findings. The appendix is slightly prominent but this is a stable finding. No mass or inflammation. The terminal ileum is normal.  The aorta demonstrates stable atherosclerotic calcifications. The branch vessels are patent. The major venous structures are patent. No mesenteric or retroperitoneal mass or adenopathy. Stable periportal and celiac axis lymph nodes.  The uterus and ovaries are unremarkable. No pelvic mass or adenopathy. The bladder appears normal. No inguinal mass or adenopathy.  Overall stable CT appearance of the osseous structures. Advanced degenerative changes involving the left hip are again noted. There are few stable small scattered sclerotic lesions (left iliac bone, left sacrum) likely treated metastatic disease.  IMPRESSION: Stable CT appearance of the chest, abdomen and pelvis. No findings for progressive metastatic disease.   Electronically Signed   By: Marijo Sanes M.D.   On: 12/27/2014 16:44   Ct Abdomen Pelvis W Contrast  12/27/2014   CLINICAL DATA:  History of right mastectomy for breast cancer. Recist protocol.  EXAM: CT CHEST, ABDOMEN AND PELVIS WITH CONTRAST  TECHNIQUE: Multidetector CT imaging of the chest, abdomen and pelvis was performed using the standard protocol following bolus administration of intravenous contrast.  CONTRAST:  14m OMNIPAQUE IOHEXOL 300 MG/ML  SOLN  COMPARISON:  11/21/2014  FINDINGS: Recist 1.1  Target  lesions:  1. Vague semi-solid pulmonary nodule in the right lower lobe on image number 38 measures 6 mm. No change. 2. Subcarinal calcified lymph node on image number 27 measures 6 mm. No change. 3. Right hilar lymph node on image number 27 measures 8 mm. No change. Non target lesions:  None.  Chest:  Stable surgical changes from a right mastectomy. The left breast appears normal. No mass or adenopathy. No supraclavicular or axillary adenopathy.  The heart is normal in size. No pericardial effusion. Small mediastinal and hilar lymph nodes as above. No new adenopathy. The aorta is normal and stable. The esophagus is normal.  Stable vague 6 mm right lower lobe pulmonary nodule as above. No new pulmonary lesions. No acute pulmonary findings. No pleural effusion.  Abdomen/pelvis:  Diffuse fatty infiltration of the liver is again demonstrated with a probable small vascular shunt noted in segment 8. No worrisome hepatic lesions to suggest metastatic disease. Focal fatty sparing near the gallbladder fossa is noted. The gallbladder demonstrates a focal area of slight nodularity which could represent focal adenoma I Oma. Possible gallstones. No common bile duct dilatation.  The pancreas demonstrates chronic atrophy but no acute findings.  The spleen is normal in size.  No focal lesions.  The adrenal glands and kidneys are stable. There is a stable small adenoma  associated with the left adrenal gland unchanged since 2014. No worrisome renal lesions or hydronephrosis.  The stomach, duodenum, small bowel and colon are unremarkable. No inflammatory changes, mass lesions or obstructive findings. The appendix is slightly prominent but this is a stable finding. No mass or inflammation. The terminal ileum is normal.  The aorta demonstrates stable atherosclerotic calcifications. The branch vessels are patent. The major venous structures are patent. No mesenteric or retroperitoneal mass or adenopathy. Stable periportal and celiac axis  lymph nodes.  The uterus and ovaries are unremarkable. No pelvic mass or adenopathy. The bladder appears normal. No inguinal mass or adenopathy.  Overall stable CT appearance of the osseous structures. Advanced degenerative changes involving the left hip are again noted. There are few stable small scattered sclerotic lesions (left iliac bone, left sacrum) likely treated metastatic disease.  IMPRESSION: Stable CT appearance of the chest, abdomen and pelvis. No findings for progressive metastatic disease.   Electronically Signed   By: Marijo Sanes M.D.   On: 12/27/2014 16:44      ASSESSMENT: 64 y.o. [place], [place] woman with stage IV breast cancer, on BOLERO-4 trial  (1) status post right mastectomy and sentinel lymph node sampling 03/22/2004 for a pT2 pN1, stage IIB invasive ductal carcinoma with lobular features, grade 2, estrogen receptor and progesterone receptor positive, HER-2 negative, with an MIB-1 of 9% ([id number])  (2) addition all right axillary lymph node sampling 05/07/2004 showed 2 benign lymph nodes (4 lymph nodes previously removed, so total was one positive lymph node out of 6; [id number])  (3) the patient was evaluated by radiation oncology; no postmastectomy radiation recommended  (4) adjuvant chemotherapy with dose dense doxorubicin and cyclophosphamide x4 cycles (first cycle delayed one week) followed by dose dense paclitaxel x4 was completed 09/18/2004  (5) tamoxifen started March 2006, discontinued 2009  METASTATIC DISEASE: (6) presenting with a large pericardial effusion, large right pleural effusion and possible right middle lobe bronchial obstruction February 2013, status post pericardial window placement, fiberoptic bronchoscopy and right Pleurx placement 09/11/2011, with biopsy of the bronchus intermedius and pericardium positive for metastatic breast cancer, estrogen receptor 91% positive with moderate staining intensity, progesterone receptor 100% positive with  strong staining intensity, with an MIB-1 of 35%, and no HER-2 amplification, the signals ratio being 1.37 ([id number])  (7) enrolled in BOLERO-4 trial 10/10/2011, receiving letrozole and everolimus  (a) two small areas of enhancement in the cerebellum noted by brain MRI 10/03/2011 were no longer apparent on repeat MRI 08/21/2012-- most recent brain MRI 04/01/2014 showed no evidence of intracranial metastatic disease  (b) sclerotic lesions in left iliac bone and sacrum have not been biopsied; stable; plan is to start zolendronate after patient updates her dental care (extraction planned)  (c) RLL lung nodule, stable (rescanned 07/12/2014 and 08/11/2014) R hilar and subcarinal lymph nodes: stable  (9) additional problems:  (a) hepatic steatosis  (b) COPD/ emphysema/ asthma  (c) advanced L hip osteoarthritis  (d) aortoiliac atherosclerosis  (e) dental evaluation pending w possible dental extractions  (f) possible thalassemia trait  (10) Bone density concerns: DEXA scan 08/11/2013 was normal   PLAN: I reviewed the scans with [name], and they again showed completely stable disease with no evidence of progression. She is tolerating the letrozole and everolimus well and will continue on the study as planned. The labs were reviewed in detail and were stable as well.  Manda will return in 4 weeks for labs and a follow up visit. She understands and agrees  with this plan. She knows the goal of treatment in her case is control. She has been encouraged to call with any issues that might arise before her next visit here.    Laurie Panda, NP   12/29/2014 9:13 AM

## 2015-01-26 ENCOUNTER — Telehealth: Payer: Self-pay

## 2015-01-26 ENCOUNTER — Other Ambulatory Visit: Payer: Self-pay | Admitting: *Deleted

## 2015-01-26 ENCOUNTER — Encounter: Payer: Self-pay | Admitting: *Deleted

## 2015-01-26 ENCOUNTER — Ambulatory Visit (HOSPITAL_BASED_OUTPATIENT_CLINIC_OR_DEPARTMENT_OTHER): Payer: Medicare Other | Admitting: Oncology

## 2015-01-26 ENCOUNTER — Other Ambulatory Visit (HOSPITAL_BASED_OUTPATIENT_CLINIC_OR_DEPARTMENT_OTHER): Payer: Medicare Other

## 2015-01-26 ENCOUNTER — Telehealth: Payer: Self-pay | Admitting: Oncology

## 2015-01-26 ENCOUNTER — Other Ambulatory Visit (HOSPITAL_COMMUNITY)
Admission: RE | Admit: 2015-01-26 | Discharge: 2015-01-26 | Disposition: A | Discharge status: Home / Self Care | Payer: Medicare Other | Source: Ambulatory Visit | Attending: Oncology | Admitting: Oncology

## 2015-01-26 VITALS — BP 115/65 | HR 81 | Temp 98.0°F | Resp 18 | Ht 65.0 in | Wt 173.0 lb

## 2015-01-26 DIAGNOSIS — C7801 Secondary malignant neoplasm of right lung: Secondary | ICD-10-CM

## 2015-01-26 DIAGNOSIS — C7800 Secondary malignant neoplasm of unspecified lung: Principal | ICD-10-CM

## 2015-01-26 DIAGNOSIS — C50911 Malignant neoplasm of unspecified site of right female breast: Secondary | ICD-10-CM

## 2015-01-26 DIAGNOSIS — I313 Pericardial effusion (noninflammatory): Secondary | ICD-10-CM

## 2015-01-26 DIAGNOSIS — Z006 Encounter for examination for normal comparison and control in clinical research program: Secondary | ICD-10-CM

## 2015-01-26 DIAGNOSIS — I3131 Malignant pericardial effusion in diseases classified elsewhere: Secondary | ICD-10-CM

## 2015-01-26 DIAGNOSIS — C801 Malignant (primary) neoplasm, unspecified: Secondary | ICD-10-CM

## 2015-01-26 LAB — CBC WITH DIFFERENTIAL/PLATELET
BASO%: 0.6 % (ref 0.0–2.0)
Basophils Absolute: 0 10*3/uL (ref 0.0–0.1)
EOS%: 2.7 % (ref 0.0–7.0)
Eosinophils Absolute: 0.2 10*3/uL (ref 0.0–0.5)
HCT: 41 % (ref 34.8–46.6)
HGB: 13.5 g/dL (ref 11.6–15.9)
LYMPH#: 1.1 10*3/uL (ref 0.9–3.3)
LYMPH%: 15.6 % (ref 14.0–49.7)
MCH: 26.7 pg (ref 25.1–34.0)
MCHC: 32.9 g/dL (ref 31.5–36.0)
MCV: 81 fL (ref 79.5–101.0)
MONO#: 0.7 10*3/uL (ref 0.1–0.9)
MONO%: 10.5 % (ref 0.0–14.0)
NEUT#: 4.8 10*3/uL (ref 1.5–6.5)
NEUT%: 70.6 % (ref 38.4–76.8)
PLATELETS: 268 10*3/uL (ref 145–400)
RBC: 5.06 10*6/uL (ref 3.70–5.45)
RDW: 14.4 % (ref 11.2–14.5)
WBC: 6.8 10*3/uL (ref 3.9–10.3)

## 2015-01-26 LAB — BILIRUBIN, DIRECT

## 2015-01-26 LAB — LIPID PANEL
Cholesterol: 234 mg/dL — ABNORMAL HIGH (ref 0–200)
HDL: 49 mg/dL (ref >=46)
LDL Cholesterol: 130 mg/dL — ABNORMAL HIGH (ref 0–99)
TRIGLYCERIDES: 277 mg/dL — AB (ref <150)
Total CHOL/HDL Ratio: 4.8 Ratio
VLDL: 55 mg/dL — AB (ref 0–40)

## 2015-01-26 LAB — COMPREHENSIVE METABOLIC PANEL (CC13)
ALK PHOS: 205 U/L — AB (ref 40–150)
ALT: 27 U/L (ref 0–55)
ANION GAP: 11 meq/L (ref 3–11)
AST: 28 U/L (ref 5–34)
Albumin: 3.5 g/dL (ref 3.5–5.0)
BUN: 16.4 mg/dL (ref 7.0–26.0)
CO2: 19 mEq/L — ABNORMAL LOW (ref 22–29)
Calcium: 10.1 mg/dL (ref 8.4–10.4)
Chloride: 111 mEq/L — ABNORMAL HIGH (ref 98–109)
Creatinine: 0.9 mg/dL (ref 0.6–1.1)
EGFR: 68 mL/min/{1.73_m2} — AB (ref >90)
Glucose: 139 mg/dl (ref 70–140)
Potassium: 4 mEq/L (ref 3.5–5.1)
Sodium: 140 mEq/L (ref 136–145)
TOTAL PROTEIN: 7.8 g/dL (ref 6.4–8.3)
Total Bilirubin: 0.35 mg/dL (ref 0.20–1.20)

## 2015-01-26 LAB — CK: Total CK: 63 U/L (ref 38–234)

## 2015-01-26 LAB — URIC ACID (CC13): Uric Acid, Serum: 3.2 mg/dl (ref 2.6–7.4)

## 2015-01-26 LAB — PHOSPHORUS: PHOSPHORUS: 3.1 mg/dL (ref 2.5–4.6)

## 2015-01-26 MED ORDER — INV-EVEROLIMUS (RAD0001) 5MG TABLET NOVARTIS CRAD001Y24135: 2.0000 | ORAL_TABLET | Freq: Every day | ORAL | Status: DC

## 2015-01-26 MED ORDER — OMEPRAZOLE 40 MG PO CPDR: 40.0000 mg | DELAYED_RELEASE_CAPSULE | Freq: Every day | ORAL | Status: DC | PRN

## 2015-01-26 NOTE — Progress Notes (Signed)
[place]  [phone]) [phone] [phone]) [phone]     ID: Barbara Parks DOB: 09/07/1950  MR#: [id number]  UQJ#:[id number]  Patient Care Team: Darcus Austin, MD as PCP - General (Family Medicine) Jerline Pain, MD (Cardiology) Gaye Pollack, MD (Cardiothoracic Surgery) Chauncey Cruel, MD as Consulting Physician (Oncology)  OTHER MD: Dr Erroll Luna- Merlene Laughter, DDS; Gaynelle Arabian MD  CHIEF COMPLAINT: metastatic breast cancer, on BOLERO study  CURRENT TREATMENT: letrozole + everolimus  BREAST CANCER HISTORY: From Dr. Collier [name]'s original intake note 04/13/2004:  "This woman has been in good health all of her life. She recently moved from [place] to work here.  She palpated a mass at about the 12 o'clock position in mid-July. She has not noticed any nipple retraction or skin changes.  She was seen by her primary care doctor who subsequently referred her for a mammogram.  Mammogram was performed on 03/01/04. This demonstrated a spiculated 2 cm mass at the 12 o'clock position in the right breast.  Upper outer quadrant of the left breast shows some distortion.   Physical exam at that time showed a firm, nontender nodule at the 12 o'clock position in the right breast, 5 cm from the nipple.  Ultrasound of this area showed a hypoechoic ill-defined mass, measuring 2.2 x 1.3 x 1.4 cm.  Physical exam of the left breast showed general vague thickening, upper outer quadrant of the left breast with a discrete palpable mass. The ultrasound performed showed a single hypoechoic ill-defined nodule at the 1 o'clock position, measuring 7 x 5 x 6 mm.  She had biopsies of both lesions on 03/02/03.  Needle core biopsy of the lesion on the right breast revealed invasive mammary carcinoma.  Needle core biopsy of the left breast showed a complex fibroadenoma.  Prognostic panel of the lesion on the right breast showed it to be ER positive at 73%, PR positive at 90% and proliferative index 9%,  HER-2 was 1+.  Patient was referred to Dr. Annamaria Boots, who performed a simple mastectomy with sentinel lymph node evaluation on 03/22/04.  Final pathology showed this to be an invasive ductal carcinoma  with lobular features, measuring 2.2 cm, grade 2 of 3.  Margins negative for carcinoma.  Invasive ductal carcinoma was extended to involve deep dermis of the nipple.  Lymphovascular invasion was identified.  Total of 4 sentinel lymph nodes were evaluated.  Touch imprints at the time of the OR was felt to be negative.  Subsequent evaluation showed a 5 mm focus of metastatic carcinoma in one of the four lymph nodes on microscopic after sectioning.  There was extracapsular extension  of one lymph node as well. The remaining three lymph nodes were all negative."  The patient's subsequent history is detailed below.  INTERVAL HISTORY: Barbara Parks returns today for follow up of her metastatic breast cancer , accompanied by her research nurse [name]. Barbara Parks continues to participate in the [place] trial, currently on cycle 44--she was patient 0001 on this trial. She tolerates the letrozole well. She does some some hot flashes, especially at night. She does not one a try gabapentin because "is not a real problem". She has some vaginal dryness, and again she is not interested in any interventions. She used to have some stiffness in her joints and was unable to fully close her grip, but that has improved and now her joints feel fine. As far as the everolimus is concerned she had some mouth sores the first 2 or  4 months but those have resolved. She doesn't have a cough. She is obtaining that medication through the study, which is essentially closed at this point, but continuing to provide everolimus to patient's like her.  REVIEW OF SYSTEMS: She just returned from a trip to [place] and [place], which was very active. She is hoping to get her left hip done so she can go back to playing golf. A detailed review of systems today  was otherwise unremarkable  PAST MEDICAL HISTORY: Past Medical History  Diagnosis Date  . Asthma   . Shortness of breath   . Arthritis     left hip  . Contact dermatitis     from a bandaid that had Latex   . Osteopenia due to cancer therapy 09/09/2013  . Hypercholesteremia   . ADHD (attention deficit hyperactivity disorder)   . breast ca 2005    breast/chemo R mastectomy  . Metastasis to lung dx'd 08/2011    PAST SURGICAL HISTORY: Past Surgical History  Procedure Laterality Date  . Spine surgery  1996  . Breast surgery  2005    right  . Video bronchoscopy  09/11/2011    Procedure: VIDEO BRONCHOSCOPY;  Surgeon: Gaye Pollack, MD;  Location: [facility];  Service: Thoracic;  Laterality: N/A;  . Chest tube insertion  09/11/2011    Procedure: INSERTION PLEURAL DRAINAGE CATHETER;  Surgeon: Gaye Pollack, MD;  Location: [facility];  Service: Thoracic;  Laterality: Right;  . Pericardial window  09/11/2011    Procedure: PERICARDIAL WINDOW;  Surgeon: Gaye Pollack, MD;  Location: [facility];  Service: Thoracic;  Laterality: N/A;  . Removal of pleural drainage catheter  12/19/2011    Procedure: REMOVAL OF PLEURAL DRAINAGE CATHETER;  Surgeon: Gaye Pollack, MD;  Location: [place];  Service: Thoracic;  Laterality: Right;  TO BE DONE IN MINOR ROOM, SHORT STAY  . Cataract extraction w/phaco Left 01/18/2014    Procedure: CATARACT EXTRACTION PHACO AND INTRAOCULAR LENS PLACEMENT (IOC);  Surgeon: Elta Guadeloupe T. Gershon Crane, MD;  Location: [facility];  Service: Ophthalmology;  Laterality: Left;  CDE 15.79  . Cataract extraction w/phaco Right 02/08/2014    Procedure: CATARACT EXTRACTION PHACO AND INTRAOCULAR LENS PLACEMENT (IOC);  Surgeon: Elta Guadeloupe T. Gershon Crane, MD;  Location: [facility];  Service: Ophthalmology;  Laterality: Right;  CDE 4.41    FAMILY HISTORY Family History  Problem Relation Age of Onset  . Cancer Mother     breast  . Heart disease Father   . Anesthesia problems Neg Hx    the patient's mother was diagnosed with breast  cancer at age 64, she died age 70 from congestive heart failure..The patient's father died from heart disease at age 52.  She has one sister alive & well.  Two brothers alive & well, one with diabetes. The patient's sister was also diagnosed with breast cancer, and was tested for the BRCA gene, and was negative. The patient herself has not been tested. There is no history of ovarian cancer in the family  GYNECOLOGIC HISTORY:  No LMP recorded. Patient is postmenopausal. Menarche age 22, the patient is GX P0. She stopped having periods with chemotherapy in 2005. 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 She was a Archivist. She is single, lives alone with her Shitzu-poodle Sammie.. Family is all in the [place] area.     ADVANCED DIRECTIVES: Not in place.  At the 02/23/2014 visit the patient was given the appropriate documents to complete and notarize at her discretion. She tells me she is planning to name her sister, Billie Ruddy, as healthcare power of attorney. [name] can be reached at [phone]   HEALTH MAINTENANCE: History  Substance Use Topics  . Smoking status: Former Smoker -- 1.50 packs/day for 30 years    Types: Cigarettes    Quit date: 09/10/2007  . Smokeless tobacco: Not on file  . Alcohol Use: No     Colonoscopy:  PAP:  Bone density: 08/11/2013, showed osteopenia  Lipid panel:  Allergies  Allergen Reactions  . Aspirin     REACTION: upset stomach  . Latex Other (See Comments)    Blistering and skin peels off  . Nsaids Nausea And Vomiting    Extreme nausea and vomiting    Current Outpatient Prescriptions  Medication Sig Dispense Refill  . cholecalciferol (VITAMIN D) 1000 UNITS tablet Take 2,000 Units by mouth daily.     Marland Kitchen gemfibrozil (LOPID) 600 MG tablet TAKE ONE TABLET BY MOUTH TWICE DAILY BEFORE A MEAL 60 tablet 3  . Investigational everolimus  (RAD001) 5 MG tablet Novartis [id number] Take 2 tablets by mouth daily. Take with a glass of water. 70 tablet 0  . Investigational everolimus (RAD001) 5 MG tablet Novartis [id number] Take 2 tablets by mouth daily. Take with a glass of water. 70 tablet 0  . letrozole (FEMARA) 2.5 MG tablet Take 1 tablet (2.5 mg total) by mouth daily. 30 tablet 0  . loperamide (IMODIUM) 2 MG capsule Take 2 mg by mouth 4 (four) times daily as needed for diarrhea or loose stools.    . metoprolol tartrate (LOPRESSOR) 25 MG tablet Take 0.5 tablets (12.5 mg total) by mouth daily. 30 tablet 4  . omeprazole (PRILOSEC) 40 MG capsule Take 1 capsule (40 mg total) by mouth daily. 90 capsule 3  . prochlorperazine (COMPAZINE) 10 MG tablet Take 1 tablet (10 mg total) by mouth every 6 (six) hours as needed (Nausea or vomiting). 30 tablet 1   No current facility-administered medications for this visit.    OBJECTIVE: Middle-aged white woman in no acute distress Filed Vitals:   01/26/15 0818  BP: 124/63  Pulse: 81  Temp:   Resp:      Body mass index is 28.79 kg/(m^2).    ECOG FS:1 - Symptomatic but completely ambulatory  Sclerae unicteric, pupils round and equal Oropharynx clear and moist-- no thrush or other lesions No cervical or supraclavicular adenopathy Lungs no rales or rhonchi Heart regular rate and rhythm Abd soft, nontender, positive bowel sounds MSK no focal spinal tenderness, no upper extremity lymphedema; walks with a limp because of the left hip discomfort Neuro: nonfocal, well oriented, appropriate affect Breasts: The right breast is status post mastectomy. Is no evidence of chest wall recurrence. The right axilla is benign. The left breast is unremarkable.    LAB RESULTS:  CMP     Component Value Date/Time   NA 139 12/29/2014 0828   NA 136 07/16/2012 0910   K 3.7 12/29/2014 0828   K 4.0 07/16/2012 0910   CL 109* 12/31/2012 0940   CL 103 07/16/2012 0910   CO2 20* 12/29/2014 0828   CO2 21  07/16/2012 0910   GLUCOSE 139 12/29/2014 0828   GLUCOSE 127* 12/31/2012 0940   GLUCOSE 140* 07/16/2012 0910   BUN 16.1 12/29/2014 0828   BUN 16 07/16/2012 0910   CREATININE 0.9 12/29/2014 0828   CREATININE 0.75  07/16/2012 0910   CALCIUM 10.0 12/29/2014 0828   CALCIUM 10.1 07/16/2012 0910   PROT 7.7 12/29/2014 0828   PROT 8.0 07/16/2012 0910   ALBUMIN 3.3* 12/29/2014 0828   ALBUMIN 3.7 07/16/2012 0910   AST 32 12/29/2014 0828   AST 30 07/16/2012 0910   ALT 29 12/29/2014 0828   ALT 33 07/16/2012 0910   ALKPHOS 239* 12/29/2014 0828   ALKPHOS 142* 07/16/2012 0910   BILITOT 0.38 12/29/2014 0828   BILITOT 0.2* 07/16/2012 0910   GFRNONAA >90 09/13/2011 0440   GFRAA >90 09/13/2011 0440    I No results found for: SPEP  Lab Results  Component Value Date   WBC 6.8 01/26/2015   NEUTROABS 4.8 01/26/2015   HGB 13.5 01/26/2015   HCT 41.0 01/26/2015   MCV 81.0 01/26/2015   PLT 268 01/26/2015      Chemistry      Component Value Date/Time   NA 139 12/29/2014 0828   NA 136 07/16/2012 0910   K 3.7 12/29/2014 0828   K 4.0 07/16/2012 0910   CL 109* 12/31/2012 0940   CL 103 07/16/2012 0910   CO2 20* 12/29/2014 0828   CO2 21 07/16/2012 0910   BUN 16.1 12/29/2014 0828   BUN 16 07/16/2012 0910   CREATININE 0.9 12/29/2014 0828   CREATININE 0.75 07/16/2012 0910      Component Value Date/Time   CALCIUM 10.0 12/29/2014 0828   CALCIUM 10.1 07/16/2012 0910   ALKPHOS 239* 12/29/2014 0828   ALKPHOS 142* 07/16/2012 0910   AST 32 12/29/2014 0828   AST 30 07/16/2012 0910   ALT 29 12/29/2014 0828   ALT 33 07/16/2012 0910   BILITOT 0.38 12/29/2014 0828   BILITOT 0.2* 07/16/2012 0910       Lab Results  Component Value Date   LABCA2 58* 09/14/2012    No components found for: [id number]  No results for input(s): INR in the last 168 hours.  Urinalysis    Component Value Date/Time   COLORURINE YELLOW 09/11/2011 0819   APPEARANCEUR CLEAR 09/11/2011 0819   LABSPEC 1.020 09/08/2014  0956   LABSPEC 1.013 09/11/2011 0819   PHURINE 6.0 09/08/2014 0956   PHURINE 6.5 09/11/2011 0819   GLUCOSEU Negative 09/08/2014 0956   GLUCOSEU NEGATIVE 09/11/2011 0819   HGBUR Large 09/08/2014 0956   HGBUR SMALL* 09/11/2011 0819   BILIRUBINUR Negative 09/08/2014 0956   BILIRUBINUR NEGATIVE 09/11/2011 0819   KETONESUR Negative 09/08/2014 0956   KETONESUR 15* 09/11/2011 0819   PROTEINUR 30 09/08/2014 0956   PROTEINUR NEGATIVE 09/11/2011 0819   UROBILINOGEN 0.2 09/08/2014 0956   UROBILINOGEN 0.2 09/11/2011 0819   NITRITE Negative 09/08/2014 0956   NITRITE NEGATIVE 09/11/2011 0819   LEUKOCYTESUR Negative 09/08/2014 0956   LEUKOCYTESUR NEGATIVE 09/11/2011 0819    STUDIES: Ct Chest W Contrast  12/27/2014   CLINICAL DATA:  History of right mastectomy for breast cancer. Recist protocol.  EXAM: CT CHEST, ABDOMEN AND PELVIS WITH CONTRAST  TECHNIQUE: Multidetector CT imaging of the chest, abdomen and pelvis was performed using the standard protocol following bolus administration of intravenous contrast.  CONTRAST:  131m OMNIPAQUE IOHEXOL 300 MG/ML  SOLN  COMPARISON:  11/21/2014  FINDINGS: Recist 1.1  Target lesions:  1. Vague semi-solid pulmonary nodule in the right lower lobe on image number 38 measures 6 mm. No change. 2. Subcarinal calcified lymph node on image number 27 measures 6 mm. No change. 3. Right hilar lymph node on image number 27 measures 8 mm. No change.  Non target lesions:  None.  Chest:  Stable surgical changes from a right mastectomy. The left breast appears normal. No mass or adenopathy. No supraclavicular or axillary adenopathy.  The heart is normal in size. No pericardial effusion. Small mediastinal and hilar lymph nodes as above. No new adenopathy. The aorta is normal and stable. The esophagus is normal.  Stable vague 6 mm right lower lobe pulmonary nodule as above. No new pulmonary lesions. No acute pulmonary findings. No pleural effusion.  Abdomen/pelvis:  Diffuse fatty  infiltration of the liver is again demonstrated with a probable small vascular shunt noted in segment 8. No worrisome hepatic lesions to suggest metastatic disease. Focal fatty sparing near the gallbladder fossa is noted. The gallbladder demonstrates a focal area of slight nodularity which could represent focal adenoma I Oma. Possible gallstones. No common bile duct dilatation.  The pancreas demonstrates chronic atrophy but no acute findings.  The spleen is normal in size.  No focal lesions.  The adrenal glands and kidneys are stable. There is a stable small adenoma associated with the left adrenal gland unchanged since 2014. No worrisome renal lesions or hydronephrosis.  The stomach, duodenum, small bowel and colon are unremarkable. No inflammatory changes, mass lesions or obstructive findings. The appendix is slightly prominent but this is a stable finding. No mass or inflammation. The terminal ileum is normal.  The aorta demonstrates stable atherosclerotic calcifications. The branch vessels are patent. The major venous structures are patent. No mesenteric or retroperitoneal mass or adenopathy. Stable periportal and celiac axis lymph nodes.  The uterus and ovaries are unremarkable. No pelvic mass or adenopathy. The bladder appears normal. No inguinal mass or adenopathy.  Overall stable CT appearance of the osseous structures. Advanced degenerative changes involving the left hip are again noted. There are few stable small scattered sclerotic lesions (left iliac bone, left sacrum) likely treated metastatic disease.  IMPRESSION: Stable CT appearance of the chest, abdomen and pelvis. No findings for progressive metastatic disease.   Electronically Signed   By: Marijo Sanes M.D.   On: 12/27/2014 16:44   Ct Abdomen Pelvis W Contrast  12/27/2014   CLINICAL DATA:  History of right mastectomy for breast cancer. Recist protocol.  EXAM: CT CHEST, ABDOMEN AND PELVIS WITH CONTRAST  TECHNIQUE: Multidetector CT imaging of  the chest, abdomen and pelvis was performed using the standard protocol following bolus administration of intravenous contrast.  CONTRAST:  136m OMNIPAQUE IOHEXOL 300 MG/ML  SOLN  COMPARISON:  11/21/2014  FINDINGS: Recist 1.1  Target lesions:  1. Vague semi-solid pulmonary nodule in the right lower lobe on image number 38 measures 6 mm. No change. 2. Subcarinal calcified lymph node on image number 27 measures 6 mm. No change. 3. Right hilar lymph node on image number 27 measures 8 mm. No change. Non target lesions:  None.  Chest:  Stable surgical changes from a right mastectomy. The left breast appears normal. No mass or adenopathy. No supraclavicular or axillary adenopathy.  The heart is normal in size. No pericardial effusion. Small mediastinal and hilar lymph nodes as above. No new adenopathy. The aorta is normal and stable. The esophagus is normal.  Stable vague 6 mm right lower lobe pulmonary nodule as above. No new pulmonary lesions. No acute pulmonary findings. No pleural effusion.  Abdomen/pelvis:  Diffuse fatty infiltration of the liver is again demonstrated with a probable small vascular shunt noted in segment 8. No worrisome hepatic lesions to suggest metastatic disease. Focal fatty sparing near the gallbladder  fossa is noted. The gallbladder demonstrates a focal area of slight nodularity which could represent focal adenoma I Oma. Possible gallstones. No common bile duct dilatation.  The pancreas demonstrates chronic atrophy but no acute findings.  The spleen is normal in size.  No focal lesions.  The adrenal glands and kidneys are stable. There is a stable small adenoma associated with the left adrenal gland unchanged since 2014. No worrisome renal lesions or hydronephrosis.  The stomach, duodenum, small bowel and colon are unremarkable. No inflammatory changes, mass lesions or obstructive findings. The appendix is slightly prominent but this is a stable finding. No mass or inflammation. The terminal  ileum is normal.  The aorta demonstrates stable atherosclerotic calcifications. The branch vessels are patent. The major venous structures are patent. No mesenteric or retroperitoneal mass or adenopathy. Stable periportal and celiac axis lymph nodes.  The uterus and ovaries are unremarkable. No pelvic mass or adenopathy. The bladder appears normal. No inguinal mass or adenopathy.  Overall stable CT appearance of the osseous structures. Advanced degenerative changes involving the left hip are again noted. There are few stable small scattered sclerotic lesions (left iliac bone, left sacrum) likely treated metastatic disease.  IMPRESSION: Stable CT appearance of the chest, abdomen and pelvis. No findings for progressive metastatic disease.   Electronically Signed   By: Marijo Sanes M.D.   On: 12/27/2014 16:44      ASSESSMENT: 64 y.o. Barbara Parks, Barbara Parks woman with stage IV breast cancer, on BOLERO-4 trial  (1) status post right mastectomy and sentinel lymph node sampling 03/22/2004 for a pT2 pN1, stage IIB invasive ductal carcinoma with lobular features, grade 2, estrogen receptor and progesterone receptor positive, HER-2 negative, with an MIB-1 of 9% ([id number] and [id number])  (2) addition all right axillary lymph node sampling 05/07/2004 showed 2 benign lymph nodes (4 lymph nodes previously removed, so total was one positive lymph node out of 6; [id number])  (3) the patient was evaluated by radiation oncology; no postmastectomy radiation recommended  (4) adjuvant chemotherapy with dose dense doxorubicin and cyclophosphamide x4 cycles (first cycle delayed one week) followed by dose dense paclitaxel x4 was completed 09/18/2004  (5) tamoxifen started March 2006, discontinued 2009  METASTATIC DISEASE: (6) presenting with a large pericardial effusion, large right pleural effusion and possible right middle lobe bronchial obstruction February 2013, status post pericardial window placement, fiberoptic bronchoscopy and  right Pleurx placement 09/11/2011, with biopsy of the bronchus intermedius and pericardium positive for metastatic breast cancer, estrogen receptor 91% positive with moderate staining intensity, progesterone receptor 100% positive with strong staining intensity, with an MIB-1 of 35%, and no HER-2 amplification, the signals ratio being 1.37 ([id number])  (7) enrolled in BOLERO-4 trial 10/10/2011, receiving letrozole and everolimus  (a) two small areas of enhancement in the cerebellum noted by brain MRI 10/03/2011 were no longer apparent on repeat MRI 08/21/2012-- most recent brain MRI 04/01/2014 showed no evidence of intracranial metastatic disease  (b) sclerotic lesions in left iliac bone and sacrum have not been biopsied; stable; plan is to start zolendronate after patient updates her dental care (extraction planned)  (c) RLL lung nodule, stable (rescanned 07/12/2014 and 08/11/2014) R hilar and subcarinal lymph nodes: stable  (9) additional problems:  (a) hepatic steatosis  (b) COPD/ emphysema/ asthma  (c) advanced L hip osteoarthritis  (d) aortoiliac atherosclerosis  (e) dental evaluation pending w possible dental extractions  (f) possible thalassemia trait  (10) Bone density concerns: DEXA scan 08/11/2013 was normal   PLAN: Barbara Parks is  doing terrific on her current medications, with no evidence of disease progression. She is already scheduled for a repeat CT scan in late July, and she will see me to start cycle 45 shortly after that.   She has been unable to arrange for an orthopedic visit. I am sending Dr.Aluisio a note asking for his help. Even if the appointment can't be in the immediate future, just so she has a date to start discussing hip replacement, she would be satisfied.   She is also working with her dentist. There is some extractions and implants pending. Accordingly we are continuing to hold the zolendronate for now.   Barbara Parks knows to call for any problems that may develop before  the next visit here.   Chauncey Cruel, MD   01/26/2015 9:02 AM

## 2015-01-26 NOTE — Addendum Note (Signed)
Addended by: Prentiss Bells on: 01/26/2015 01:15 PM   Modules accepted: Orders

## 2015-01-26 NOTE — Telephone Encounter (Signed)
Labs/ov added per 06/30 POF, pt is to pick up schedule at next visit... KJ, will also mail out schedule

## 2015-01-26 NOTE — Telephone Encounter (Signed)
Office notes dtd 01/26/15 faxed to Dr. Mitzie Na office.  Sent to scan.

## 2015-01-26 NOTE — Addendum Note (Signed)
Addended by: Prentiss Bells on: 01/26/2015 03:02 PM   Modules accepted: Orders

## 2015-01-26 NOTE — Progress Notes (Signed)
01/26/15 at 11:05am - [employer], Bolero 4, cycle 44, day 1 study notes- The pt was into the cancer center this am for her fasting labs and cycle 44, day 1assessments. The pt confirmed that she was fasting upon arrival to the cancer center for her labs. The pt was seen and examined today by Dr. Jana Hakim. He reviewed the pt's labs today, and he felt that her abnormal lab values were "not clinically significant". Her glucose was normal at 139. The pt's vitals and weight were stable, and her mean BP was obtained for study purposes. The pt reports that she gained some weight because her family "made her eat too much" during her recent visit home. She reports that she is continuing to take Tylenol Arthritis twice a week with good relief for her back pain. She reports the following AE's as ongoing: hot flashes, memory impairment, intermittent nausea, fatigue, back pain, and bilateral knee pain. She reports the following concomitant medications as ongoing: Lopressor, compazine prn, immodium prn, lopid, ibuprofen prn, vitamin D3, and prilosec. She is performing all of her usual activities with some limitations. ECOG=1. The pt said that she overall feels good today, and she states that she has been mowing and weeding her big yard. The pt said that she has attempted to contact Dr. Anne Fu office for a consult.  She has no definite date for her hip surgery yet. Dr. Jana Hakim said that he would send Dr. Raphael Gibney a message requesting a consult visit.  The pt has had osteoarthritis involving the left hip since screening. The pt said that she is still having ongoing dental work done, and her dentist was against the pt taking zolendronate.  The pt's lipid panel is pending. The pt returned her cycle 43 everolimus study drug boxes. She confirmed that she took her everolimus (10 mg) every day along with letrozole (2.5 mg) from 12/01/14 - 12/28/14 (28 days). The pt said that she must have missed a dose since her box had 2  pills left. The pt confirmed that she did not miss any doses of everolimus or letrozole. The pt's everolimus drug boxes had 0 remaining (unopened) blister packs. The pt was dispensed 56 pills - 56 doses taken = 0 returned. Therefore, the pt is 100% compliant with her study drug, everolimus, for cycle 43. The pt's everolimus was taken to the pharmacy for the drug accountability check and storage. The pt was dispensed her cycle 44 everolimus study drug boxes for self administration. The pt is aware of her July appointments. Brion Aliment RN, BSN, CCRP Clinical Research Nurse 01/26/2015 11:15 AM   01/26/15 at 3:02pm- The pt's lipid panel is stable.   Brion Aliment RN, BSN, CCRP Clinical Research Nurse 01/26/2015 3:02 PM

## 2015-02-03 ENCOUNTER — Encounter: Payer: Self-pay | Admitting: *Deleted

## 2015-02-03 DIAGNOSIS — M1612 Unilateral primary osteoarthritis, left hip: Secondary | ICD-10-CM | POA: Diagnosis not present

## 2015-02-03 NOTE — Progress Notes (Signed)
02/03/15 at 9:44am - Upon closer review of the progress note dated 01/26/15, it was determined that the date range of study drug was incorrect.  According to the pt, the pt took her study drug from 12/29/14 - 01/25/15 (28 days).  The pt did not miss any doses of study drug for cycle 43.  The pt was 100% compliant with her study drug for cycle 43.   Brion Aliment RN, BSN, CCRP Clinical Research Nurse 02/03/2015 9:54 AM

## 2015-02-17 ENCOUNTER — Other Ambulatory Visit: Payer: Self-pay

## 2015-02-17 DIAGNOSIS — Z1231 Encounter for screening mammogram for malignant neoplasm of breast: Secondary | ICD-10-CM

## 2015-02-21 ENCOUNTER — Ambulatory Visit (HOSPITAL_COMMUNITY)
Admission: RE | Admit: 2015-02-21 | Discharge: 2015-02-21 | Disposition: A | Discharge status: Home / Self Care | Payer: Medicare Other | Source: Ambulatory Visit | Attending: Oncology | Admitting: Oncology

## 2015-02-21 ENCOUNTER — Encounter (HOSPITAL_COMMUNITY): Payer: Self-pay

## 2015-02-21 DIAGNOSIS — R918 Other nonspecific abnormal finding of lung field: Secondary | ICD-10-CM | POA: Insufficient documentation

## 2015-02-21 DIAGNOSIS — Z79899 Other long term (current) drug therapy: Secondary | ICD-10-CM | POA: Diagnosis not present

## 2015-02-21 DIAGNOSIS — Z08 Encounter for follow-up examination after completed treatment for malignant neoplasm: Secondary | ICD-10-CM | POA: Insufficient documentation

## 2015-02-21 DIAGNOSIS — C50911 Malignant neoplasm of unspecified site of right female breast: Secondary | ICD-10-CM | POA: Insufficient documentation

## 2015-02-21 DIAGNOSIS — Z9011 Acquired absence of right breast and nipple: Secondary | ICD-10-CM | POA: Insufficient documentation

## 2015-02-21 DIAGNOSIS — C7801 Secondary malignant neoplasm of right lung: Secondary | ICD-10-CM | POA: Diagnosis not present

## 2015-02-21 DIAGNOSIS — C7951 Secondary malignant neoplasm of bone: Secondary | ICD-10-CM | POA: Insufficient documentation

## 2015-02-21 DIAGNOSIS — C7800 Secondary malignant neoplasm of unspecified lung: Secondary | ICD-10-CM | POA: Diagnosis not present

## 2015-02-21 MED ORDER — IOHEXOL 300 MG/ML  SOLN
100.0000 mL | Freq: Once | INTRAMUSCULAR | Status: AC | PRN
  Administered 2015-02-21: 100 mL via INTRAVENOUS

## 2015-02-21 MED ORDER — IOHEXOL 300 MG/ML  SOLN
50.0000 mL | Freq: Once | INTRAMUSCULAR | Status: AC | PRN
  Administered 2015-02-21: 50 mL via ORAL

## 2015-02-22 ENCOUNTER — Ambulatory Visit (HOSPITAL_BASED_OUTPATIENT_CLINIC_OR_DEPARTMENT_OTHER): Payer: Medicare Other | Admitting: Oncology

## 2015-02-22 ENCOUNTER — Encounter: Payer: Self-pay | Admitting: *Deleted

## 2015-02-22 ENCOUNTER — Other Ambulatory Visit (HOSPITAL_BASED_OUTPATIENT_CLINIC_OR_DEPARTMENT_OTHER): Payer: Medicare Other

## 2015-02-22 ENCOUNTER — Other Ambulatory Visit (HOSPITAL_COMMUNITY)
Admission: RE | Admit: 2015-02-22 | Discharge: 2015-02-22 | Disposition: A | Discharge status: Home / Self Care | Payer: Medicare Other | Source: Ambulatory Visit | Attending: Oncology | Admitting: Oncology

## 2015-02-22 ENCOUNTER — Other Ambulatory Visit: Payer: Self-pay | Admitting: *Deleted

## 2015-02-22 ENCOUNTER — Encounter: Payer: Self-pay | Admitting: Oncology

## 2015-02-22 VITALS — BP 117/63 | HR 91 | Temp 97.9°F | Resp 18 | Ht 65.0 in | Wt 168.0 lb

## 2015-02-22 DIAGNOSIS — C50911 Malignant neoplasm of unspecified site of right female breast: Secondary | ICD-10-CM

## 2015-02-22 DIAGNOSIS — C7801 Secondary malignant neoplasm of right lung: Secondary | ICD-10-CM | POA: Diagnosis not present

## 2015-02-22 DIAGNOSIS — Z006 Encounter for examination for normal comparison and control in clinical research program: Secondary | ICD-10-CM | POA: Diagnosis not present

## 2015-02-22 DIAGNOSIS — I3131 Malignant pericardial effusion in diseases classified elsewhere: Secondary | ICD-10-CM

## 2015-02-22 DIAGNOSIS — C7800 Secondary malignant neoplasm of unspecified lung: Secondary | ICD-10-CM

## 2015-02-22 DIAGNOSIS — C801 Malignant (primary) neoplasm, unspecified: Secondary | ICD-10-CM

## 2015-02-22 DIAGNOSIS — I313 Pericardial effusion (noninflammatory): Secondary | ICD-10-CM

## 2015-02-22 LAB — LIPID PANEL
CHOL/HDL RATIO: 5.3 ratio — AB (ref <=5.0)
CHOLESTEROL: 270 mg/dL — AB (ref 125–200)
HDL: 51 mg/dL (ref >=46)
LDL CALC: 157 mg/dL — AB (ref <130)
Triglycerides: 309 mg/dL — ABNORMAL HIGH (ref <150)
VLDL: 62 mg/dL — ABNORMAL HIGH (ref <30)

## 2015-02-22 LAB — CBC WITH DIFFERENTIAL/PLATELET
BASO%: 1.4 % (ref 0.0–2.0)
Basophils Absolute: 0.1 10*3/uL (ref 0.0–0.1)
EOS%: 2.1 % (ref 0.0–7.0)
Eosinophils Absolute: 0.2 10*3/uL (ref 0.0–0.5)
HCT: 41.6 % (ref 34.8–46.6)
HGB: 14.2 g/dL (ref 11.6–15.9)
LYMPH#: 0.9 10*3/uL (ref 0.9–3.3)
LYMPH%: 12.4 % — AB (ref 14.0–49.7)
MCH: 26.7 pg (ref 25.1–34.0)
MCHC: 34.2 g/dL (ref 31.5–36.0)
MCV: 78.1 fL — ABNORMAL LOW (ref 79.5–101.0)
MONO#: 0.8 10*3/uL (ref 0.1–0.9)
MONO%: 10.8 % (ref 0.0–14.0)
NEUT#: 5.5 10*3/uL (ref 1.5–6.5)
NEUT%: 73.3 % (ref 38.4–76.8)
PLATELETS: 309 10*3/uL (ref 145–400)
RBC: 5.32 10*6/uL (ref 3.70–5.45)
RDW: 14.1 % (ref 11.2–14.5)
WBC: 7.4 10*3/uL (ref 3.9–10.3)

## 2015-02-22 LAB — COMPREHENSIVE METABOLIC PANEL (CC13)
ALBUMIN: 3.7 g/dL (ref 3.5–5.0)
ALT: 35 U/L (ref 0–55)
AST: 41 U/L — ABNORMAL HIGH (ref 5–34)
Alkaline Phosphatase: 266 U/L — ABNORMAL HIGH (ref 40–150)
Anion Gap: 10 mEq/L (ref 3–11)
BUN: 15.2 mg/dL (ref 7.0–26.0)
CO2: 22 meq/L (ref 22–29)
CREATININE: 1 mg/dL (ref 0.6–1.1)
Calcium: 10.7 mg/dL — ABNORMAL HIGH (ref 8.4–10.4)
Chloride: 107 mEq/L (ref 98–109)
EGFR: 59 mL/min/{1.73_m2} — ABNORMAL LOW (ref >90)
Glucose: 151 mg/dl — ABNORMAL HIGH (ref 70–140)
POTASSIUM: 4.3 meq/L (ref 3.5–5.1)
SODIUM: 140 meq/L (ref 136–145)
Total Bilirubin: 0.31 mg/dL (ref 0.20–1.20)
Total Protein: 8.4 g/dL — ABNORMAL HIGH (ref 6.4–8.3)

## 2015-02-22 LAB — BILIRUBIN, DIRECT

## 2015-02-22 LAB — URIC ACID (CC13): Uric Acid, Serum: 3.4 mg/dl (ref 2.6–7.4)

## 2015-02-22 LAB — PHOSPHORUS: Phosphorus: 3.1 mg/dL (ref 2.5–4.6)

## 2015-02-22 LAB — CK: Total CK: 51 U/L (ref 38–234)

## 2015-02-22 NOTE — Progress Notes (Signed)
02/22/15 at 11:39am -[employer] Bolero 4 - cycle 45, day 1 study notes- The pt was into the cancer center this am for her fasting labs and cycle 45, day 1assessments. The pt confirmed that she was fasting upon arrival to the cancer center for her labs. The pt was seen and examined today by Dr. Jana Hakim. He reviewed the pt's labs today, and he felt that her abnormal lab values were "not clinically significant". Her glucose was slightly elevated at 151. The pt's vitals and weight were stable, and her mean BP was obtained for study purposes. The pt reports that she is trying to lose some weight before her upcoming hip replacement surgery in November. Dr. Jana Hakim reviewed her CT scans from 02/21/15.  He confirmed that she has maintained a "partial response".  He said that he is very pleased with her status, and he wishes to continue her study treatment without any modifications.  She reports the following AE's as ongoing: hot flashes, memory impairment, intermittent nausea, fatigue, back pain, and bilateral knee pain. She reports the following concomitant medications as ongoing: Lopressor, compazine prn, immodium prn, lopid, ibuprofen prn, vitamin D, and prilosec. She is performing all of her usual activities with some limitations. ECOG=1. The pt said that she overall feels good today, and she states that she has been mowing and weeding her big yard.The pt said that she finally saw Dr. Wynelle Link, and her surgery is scheduled for 06/07/15.  The pt has had osteoarthritis involving the left hip since screening. The research nurse notified the study monitor regarding her upcoming surgery.  Dr. Jana Hakim said that he wants the pt to remain on her study drug without any interruption before, during, and after her surgery.  The pt was also in agreement to remain on her eveolimus and letrozole continuously.    The pt said that she is still having ongoing dental work done on 03/08/15.   The pt's lipid panel is pending. The  pt returned her cycle 44 everolimus study drug boxes. She confirmed that she took her everolimus (10 mg) every day along with letrozole (2.5 mg) from 01/26/15 - 02/21/15 (27 days). The pt's everolimus drug boxes had 2 remaining (unopened) blister packs. The pt was dispensed 56 pills - 54 (27 days x 2 pills= 54). Therefore, the pt is 100% compliant with her study drug, everolimus, for cycle 44. The pt's everolimus was taken to the pharmacy for the drug accountability check and storage. The pt was dispensed her cycle 45 everolimus study drug boxes for self administration. The pt is aware of her August appointments. Brion Aliment RN, BSN Clinical Research Nurse 02/22/2015 11:53 AM   02/27/15 at 11:14am - The pt's lipid panel is stable.   Brion Aliment RN, BSN Clinical Research Nurse 02/27/2015 11:15 AM

## 2015-02-22 NOTE — Progress Notes (Signed)
[facility]  [phone] [phone]     ID: Barbara Parks DOB: 08/17/1950  MR#: [id number]  CSN#:[id number]  Patient Care Team: Donna Gates, MD as PCP - General (Family Medicine) Mark C Skains, MD (Cardiology) Bryan K Bartle, MD (Cardiothoracic Surgery) Gustav C Magrinat, MD as Consulting Physician (Oncology) OTHER MD: Dr Sandra Barlow- [place], DDS; Frank Aluisio MD  CHIEF COMPLAINT: metastatic breast cancer, on BOLERO study  CURRENT TREATMENT: letrozole + everolimus  BREAST CANCER HISTORY: From Dr. Peter Rubin's original intake note 04/13/2004:  "This woman has been in good health all of her life. She recently moved from [place] to work here.  She palpated a mass at about the 12 o'clock position in mid-July. She has not noticed any nipple retraction or skin changes.  She was seen by her primary care doctor who subsequently referred her for a mammogram.  Mammogram was performed on 03/01/04. This demonstrated a spiculated 2 cm mass at the 12 o'clock position in the right breast.  Upper outer quadrant of the left breast shows some distortion.   Physical exam at that time showed a firm, nontender nodule at the 12 o'clock position in the right breast, 5 cm from the nipple.  Ultrasound of this area showed a hypoechoic ill-defined mass, measuring 2.2 x 1.3 x 1.4 cm.  Physical exam of the left breast showed general vague thickening, upper outer quadrant of the left breast with a discrete palpable mass. The ultrasound performed showed a single hypoechoic ill-defined nodule at the 1 o'clock position, measuring 7 x 5 x 6 mm.  She had biopsies of both lesions on 03/02/03.  Needle core biopsy of the lesion on the right breast revealed invasive mammary carcinoma.  Needle core biopsy of the left breast showed a complex fibroadenoma.  Prognostic panel of the lesion on the right breast showed it to be ER positive at 73%, PR positive at 90% and proliferative index 9%, HER-2  was 1+.  Patient was referred to Dr. Young, who performed a simple mastectomy with sentinel lymph node evaluation on 03/22/04.  Final pathology showed this to be an invasive ductal carcinoma  with lobular features, measuring 2.2 cm, grade 2 of 3.  Margins negative for carcinoma.  Invasive ductal carcinoma was extended to involve deep dermis of the nipple.  Lymphovascular invasion was identified.  Total of 4 sentinel lymph nodes were evaluated.  Touch imprints at the time of the OR was felt to be negative.  Subsequent evaluation showed a 5 mm focus of metastatic carcinoma in one of the four lymph nodes on microscopic after sectioning.  There was extracapsular extension  of one lymph node as well. The remaining three lymph nodes were all negative."  The patient's subsequent history is detailed below.  INTERVAL HISTORY: Barbara Parks returns today for follow up of her metastatic breast cancer, accompanied by her research nurse Nikki Eldreth. Today is day 1 cycle 45 in the BOLERO-4 trial4--she was patient 0001 on this trial. She is on letrozole with mild nighttime hot flashes, no problems with vaginal dryness, and no arthralgias/myalgias other than of course her significant hip problem which finally will be taking care of 06/07/2015, with hip replacement planned under Frank Aluisio. As far as the everolimus goes the only side effect she reports is diarrhea. This occurs once or twice a week; she will have 2 or 3 loose bowel movements those mornings and at the third one she will take an Imodium which resolves the issue. Specifically she   denies mouth sores, cough, or unusual fatigue. There has been no bleeding or bruising.  REVIEW OF SYSTEMS: A detailed review of systems today was otherwise noncontributory  PAST MEDICAL HISTORY: Past Medical History  Diagnosis Date  . Asthma   . Shortness of breath   . Arthritis     left hip  . Contact dermatitis     from a bandaid that had Latex   . Osteopenia due to cancer  therapy 09/09/2013  . Hypercholesteremia   . ADHD (attention deficit hyperactivity disorder)   . breast ca 2005    breast/chemo R mastectomy  . Metastasis to lung dx'd 08/2011    PAST SURGICAL HISTORY: Past Surgical History  Procedure Laterality Date  . Spine surgery  1996  . Breast surgery  2005    right  . Video bronchoscopy  09/11/2011    Procedure: VIDEO BRONCHOSCOPY;  Surgeon: Gaye Pollack, MD;  Location: [place];  Service: Thoracic;  Laterality: N/A;  . Chest tube insertion  09/11/2011    Procedure: INSERTION PLEURAL DRAINAGE CATHETER;  Surgeon: Gaye Pollack, MD;  Location: [place];  Service: Thoracic;  Laterality: Right;  . Pericardial window  09/11/2011    Procedure: PERICARDIAL WINDOW;  Surgeon: Gaye Pollack, MD;  Location: [place];  Service: Thoracic;  Laterality: N/A;  . Removal of pleural drainage catheter  12/19/2011    Procedure: REMOVAL OF PLEURAL DRAINAGE CATHETER;  Surgeon: Gaye Pollack, MD;  Location: [place];  Service: Thoracic;  Laterality: Right;  TO BE DONE IN MINOR ROOM, SHORT STAY  . Cataract extraction w/phaco Left 01/18/2014    Procedure: CATARACT EXTRACTION PHACO AND INTRAOCULAR LENS PLACEMENT (IOC);  Surgeon: Elta Guadeloupe T. Gershon Crane, MD;  Location: [facility];  Service: Ophthalmology;  Laterality: Left;  CDE 15.79  . Cataract extraction w/phaco Right 02/08/2014    Procedure: CATARACT EXTRACTION PHACO AND INTRAOCULAR LENS PLACEMENT (IOC);  Surgeon: Elta Guadeloupe T. Gershon Crane, MD;  Location: [facility];  Service: Ophthalmology;  Laterality: Right;  CDE 4.41    FAMILY HISTORY Family History  Problem Relation Age of Onset  . Cancer Mother     breast  . Heart disease Father   . Anesthesia problems Neg Hx    the patient's mother was diagnosed with breast cancer at age 37, she died age 78 from congestive heart failure..The patient's father died from heart disease at age 63.  She has one sister alive & well.  Two brothers alive & well, one with diabetes. The patient's sister was also diagnosed  with breast cancer, and was tested for the BRCA gene, and was negative. The patient herself has not been tested. There is no history of ovarian cancer in the family  GYNECOLOGIC HISTORY:  No LMP recorded. Patient is postmenopausal. Menarche age 37, the patient is GX P0. She stopped having periods with chemotherapy in 2005. 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 She was a Archivist. She is single, lives alone with her Shitzu-poodle Sammie.. Family is all in the [place] area.     ADVANCED DIRECTIVES: Not in place. At the 02/23/2014 visit the patient was given the appropriate documents to complete and notarize at her discretion. She tells me she is planning to name her sister, Barbara Parks, as healthcare power of attorney. [name] can be reached at [phone]   HEALTH MAINTENANCE: History  Substance Use Topics  .  Smoking status: Former Smoker -- 1.50 packs/day for 30 years    Types: Cigarettes    Quit date: 09/10/2007  . Smokeless tobacco: Not on file  . Alcohol Use: No     Colonoscopy:  PAP:  Bone density: 08/11/2013, showed osteopenia  Lipid panel:  Allergies  Allergen Reactions  . Aspirin     REACTION: upset stomach  . Latex Other (See Comments)    Blistering and skin peels off  . Nsaids Nausea And Vomiting    Extreme nausea and vomiting    Current Outpatient Prescriptions  Medication Sig Dispense Refill  . cholecalciferol (VITAMIN D) 1000 UNITS tablet Take 2,000 Units by mouth daily.     . gemfibrozil (LOPID) 600 MG tablet TAKE ONE TABLET BY MOUTH TWICE DAILY BEFORE A MEAL 60 tablet 3  . Investigational everolimus (RAD001) 5 MG tablet Novartis [id number] Take 2 tablets by mouth daily. Take with a glass of water. 70 tablet 0  . Investigational everolimus (RAD001) 5 MG tablet Novartis [id number] Take 2 tablets by mouth daily. Take with a glass of  water. 70 tablet 0  . letrozole (FEMARA) 2.5 MG tablet Take 1 tablet (2.5 mg total) by mouth daily. 30 tablet 0  . loperamide (IMODIUM) 2 MG capsule Take 2 mg by mouth 4 (four) times daily as needed for diarrhea or loose stools.    . metoprolol tartrate (LOPRESSOR) 25 MG tablet Take 0.5 tablets (12.5 mg total) by mouth daily. 30 tablet 4  . omeprazole (PRILOSEC) 40 MG capsule Take 1 capsule (40 mg total) by mouth daily as needed. 90 capsule 3  . prochlorperazine (COMPAZINE) 10 MG tablet Take 1 tablet (10 mg total) by mouth every 6 (six) hours as needed (Nausea or vomiting). 30 tablet 1   No current facility-administered medications for this visit.    OBJECTIVE: Middle-aged white woman who appears stated age Filed Vitals:   02/22/15 0917  BP: 117/63  Pulse:   Temp:   Resp:      Body mass index is 27.96 kg/(m^2).    ECOG FS:1 - Symptomatic but completely ambulatory  Sclerae unicteric, EOMs intact Oropharynx clear, dentition in good repair No cervical or supraclavicular adenopathy Lungs no rales or rhonchi Heart regular rate and rhythm Abd soft, nontender, positive bowel sounds MSK no focal spinal tenderness, no upper extremity lymphedema, walks with a cane with slight limp Neuro: nonfocal, well oriented, appropriate affect Breasts: The right breast is status post mastectomy; there is no evidence of chest wall recurrence. The right axilla is benign. Left breast is unremarkable.  LAB RESULTS:  CMP     Component Value Date/Time   NA 140 01/26/2015 0839   NA 136 07/16/2012 0910   K 4.0 01/26/2015 0839   K 4.0 07/16/2012 0910   CL 109* 12/31/2012 0940   CL 103 07/16/2012 0910   CO2 19* 01/26/2015 0839   CO2 21 07/16/2012 0910   GLUCOSE 139 01/26/2015 0839   GLUCOSE 127* 12/31/2012 0940   GLUCOSE 140* 07/16/2012 0910   BUN 16.4 01/26/2015 0839   BUN 16 07/16/2012 0910   CREATININE 0.9 01/26/2015 0839   CREATININE 0.75 07/16/2012 0910   CALCIUM 10.1 01/26/2015 0839   CALCIUM  10.1 07/16/2012 0910   PROT 7.8 01/26/2015 0839   PROT 8.0 07/16/2012 0910   ALBUMIN 3.5 01/26/2015 0839   ALBUMIN 3.7 07/16/2012 0910   AST 28 01/26/2015 0839   AST 30 07/16/2012 0910   ALT 27 01/26/2015 0839   ALT 33   07/16/2012 0910   ALKPHOS 205* 01/26/2015 0839   ALKPHOS 142* 07/16/2012 0910   BILITOT 0.35 01/26/2015 0839   BILITOT 0.2* 07/16/2012 0910   GFRNONAA >90 09/13/2011 0440   GFRAA >90 09/13/2011 0440    I No results found for: SPEP  Lab Results  Component Value Date   WBC 7.4 02/22/2015   NEUTROABS 5.5 02/22/2015   HGB 14.2 02/22/2015   HCT 41.6 02/22/2015   MCV 78.1* 02/22/2015   PLT 309 02/22/2015      Chemistry      Component Value Date/Time   NA 140 01/26/2015 0839   NA 136 07/16/2012 0910   K 4.0 01/26/2015 0839   K 4.0 07/16/2012 0910   CL 109* 12/31/2012 0940   CL 103 07/16/2012 0910   CO2 19* 01/26/2015 0839   CO2 21 07/16/2012 0910   BUN 16.4 01/26/2015 0839   BUN 16 07/16/2012 0910   CREATININE 0.9 01/26/2015 0839   CREATININE 0.75 07/16/2012 0910      Component Value Date/Time   CALCIUM 10.1 01/26/2015 0839   CALCIUM 10.1 07/16/2012 0910   ALKPHOS 205* 01/26/2015 0839   ALKPHOS 142* 07/16/2012 0910   AST 28 01/26/2015 0839   AST 30 07/16/2012 0910   ALT 27 01/26/2015 0839   ALT 33 07/16/2012 0910   BILITOT 0.35 01/26/2015 0839   BILITOT 0.2* 07/16/2012 0910       Lab Results  Component Value Date   LABCA2 58* 09/14/2012    No components found for: LABCA125  No results for input(s): INR in the last 168 hours.  Urinalysis    Component Value Date/Time   COLORURINE YELLOW 09/11/2011 0819   APPEARANCEUR CLEAR 09/11/2011 0819   LABSPEC 1.020 09/08/2014 0956   LABSPEC 1.013 09/11/2011 0819   PHURINE 6.0 09/08/2014 0956   PHURINE 6.5 09/11/2011 0819   GLUCOSEU Negative 09/08/2014 0956   GLUCOSEU NEGATIVE 09/11/2011 0819   HGBUR Large 09/08/2014 0956   HGBUR SMALL* 09/11/2011 0819   BILIRUBINUR Negative 09/08/2014 0956    BILIRUBINUR NEGATIVE 09/11/2011 0819   KETONESUR Negative 09/08/2014 0956   KETONESUR 15* 09/11/2011 0819   PROTEINUR 30 09/08/2014 0956   PROTEINUR NEGATIVE 09/11/2011 0819   UROBILINOGEN 0.2 09/08/2014 0956   UROBILINOGEN 0.2 09/11/2011 0819   NITRITE Negative 09/08/2014 0956   NITRITE NEGATIVE 09/11/2011 0819   LEUKOCYTESUR Negative 09/08/2014 0956   LEUKOCYTESUR NEGATIVE 09/11/2011 0819    STUDIES: Ct Chest W Contrast  02/21/2015   CLINICAL DATA:  64-year-old female presenting for restaging of right breast cancer diagnosed in 2005 status post right mastectomy, with lung metastases diagnosed in 2013, with ongoing oral chemotherapy.  EXAM: CT CHEST, ABDOMEN, AND PELVIS WITH CONTRAST  TECHNIQUE: Multidetector CT imaging of the chest, abdomen and pelvis was performed following the standard protocol during bolus administration of intravenous contrast.  CONTRAST:  100mL OMNIPAQUE IOHEXOL 300 MG/ML  SOLN  COMPARISON:  Most recent CT of the chest, abdomen and pelvis from 12/27/2014.  FINDINGS: RECIST  Target Lesions:  1. Right lower lobe lung nodule:  0.6 cm, previously 0.6 cm 2. Subcarinal lymph node: 0.6 cm, previously 0.6 cm, with stable coarse internal calcifications 3. Right hilar lymph node:  0.7 cm, previously 0.8 cm Non-target Lesions:  1. Small right pleural effusion: Resolved (no current right pleural effusion) 2. Superior segment right lower lobe lung nodule: Resolved (the superior segment right lower lobe lung nodule described on the baseline 10/03/2011 chest CT study is not apparent on the   current scan, as on the prior chest CT)  CT CHEST FINDINGS  Mediastinum/Nodes: Normal heart size, with no pericardial fluid or thickening. Great vessels are normal in course and calibre. No central pulmonary emboli. Normal visualized thyroid. Normal esophagus. Right axillary surgical clips. No axillary lymphadenopathy. No mediastinal or hilar lymphadenopathy by size criteria. Coarse calcifications within  the subcentimeter subcarinal node, unchanged, likely from prior granulomatous disease.  Lungs/Pleura: No pleural effusion. No pneumothorax. Peripheral right upper lobe 2 mm pulmonary nodule (series 4/image 22), not appreciably changed back to 10/03/2011, probably benign. Stable 0.6 cm right lower lobe pulmonary nodule (4/41). Stable mild scarring in the dependent right lower lobe. No new significant pulmonary nodules. No new focal lung consolidation.  Musculoskeletal: Status post right mastectomy. Mild degenerative changes in the thoracic spine, with no suspicious focal osseous lesions in the chest.  CT ABDOMEN AND PELVIS FINDINGS  Hepatobiliary: There is mildly heterogeneous diffuse hepatic steatosis. Re- demonstrated is an ill defined 1.3 cm hyperenhancing focus in the right liver lobe (series 2/image 55), which is nearly imperceptible on the delayed sequence, and is unchanged back to 10/03/2011, most in keeping with a benign transient perfusional phenomenon. Otherwise normal liver, with no liver mass. Focal thickening at the gallbladder fundus is unchanged since 02/18/2012, and is in keeping with focal adenomyomatosis. Otherwise normal gallbladder, with no radiopaque cholelithiasis. No biliary ductal dilatation.  Pancreas: Normal.  Spleen: Normal.  Adrenals/Urinary Tract: Normal right adrenal. Left adrenal 1.0 cm nodule (2/61) with indeterminate CT density, unchanged in size since 10/03/2011, probably benign. Normal kidneys with no hydronephrosis and no renal mass. Minimally distended and grossly normal urinary bladder.  Stomach/Bowel: Grossly normal stomach. Normal caliber small bowel with no small bowel wall thickening. Normal appendix. Mild sigmoid diverticulosis.  Vascular/Lymphatic: No abdominal or pelvic lymphadenopathy. Atherosclerotic nonaneurysmal thoracic aorta.  Reproductive: Grossly normal uterus.  No adnexal abnormality.  Other: No pneumoperitoneum, ascites or focal fluid collection.   Musculoskeletal: Moderate degenerative changes in the lumbar spine. Asymmetric prominent left hip osteoarthritis. An indistinct sclerotic focus in the anterior left iliac bone (2/93) is unchanged back to 04/16/2004 and likely benign. Non expansile sclerotic osseous lesion in the left sacrum (2/96), which is unchanged in the interval and is increased in density compared to 10/03/11, in keeping with a treated metastasis. No new suspicious focal osseous lesions.  IMPRESSION: 1. Two stable subcentimeter right pulmonary nodules. 2. Stable sclerotic left sacral osseous metastasis. 3. Otherwise no evidence of metastatic disease in the chest, abdomen or pelvis. 4. RECIST findings as above.   Electronically Signed   By: Ilona Sorrel M.D.   On: 02/21/2015 15:10   Ct Abdomen Pelvis W Contrast  02/21/2015   CLINICAL DATA:  64 year old female presenting for restaging of right breast cancer diagnosed in 2005 status post right mastectomy, with lung metastases diagnosed in 2013, with ongoing oral chemotherapy.  EXAM: CT CHEST, ABDOMEN, AND PELVIS WITH CONTRAST  TECHNIQUE: Multidetector CT imaging of the chest, abdomen and pelvis was performed following the standard protocol during bolus administration of intravenous contrast.  CONTRAST:  145m OMNIPAQUE IOHEXOL 300 MG/ML  SOLN  COMPARISON:  Most recent CT of the chest, abdomen and pelvis from 12/27/2014.  FINDINGS: RECIST  Target Lesions:  1. Right lower lobe lung nodule:  0.6 cm, previously 0.6 cm 2. Subcarinal lymph node: 0.6 cm, previously 0.6 cm, with stable coarse internal calcifications 3. Right hilar lymph node:  0.7 cm, previously 0.8 cm Non-target Lesions:  1. Small right pleural effusion: Resolved (no  current right pleural effusion) 2. Superior segment right lower lobe lung nodule: Resolved (the superior segment right lower lobe lung nodule described on the baseline 10/03/2011 chest CT study is not apparent on the current scan, as on the prior chest CT)  CT CHEST FINDINGS   Mediastinum/Nodes: Normal heart size, with no pericardial fluid or thickening. Great vessels are normal in course and calibre. No central pulmonary emboli. Normal visualized thyroid. Normal esophagus. Right axillary surgical clips. No axillary lymphadenopathy. No mediastinal or hilar lymphadenopathy by size criteria. Coarse calcifications within the subcentimeter subcarinal node, unchanged, likely from prior granulomatous disease.  Lungs/Pleura: No pleural effusion. No pneumothorax. Peripheral right upper lobe 2 mm pulmonary nodule (series 4/image 22), not appreciably changed back to 10/03/2011, probably benign. Stable 0.6 cm right lower lobe pulmonary nodule (4/41). Stable mild scarring in the dependent right lower lobe. No new significant pulmonary nodules. No new focal lung consolidation.  Musculoskeletal: Status post right mastectomy. Mild degenerative changes in the thoracic spine, with no suspicious focal osseous lesions in the chest.  CT ABDOMEN AND PELVIS FINDINGS  Hepatobiliary: There is mildly heterogeneous diffuse hepatic steatosis. Re- demonstrated is an ill defined 1.3 cm hyperenhancing focus in the right liver lobe (series 2/image 55), which is nearly imperceptible on the delayed sequence, and is unchanged back to 10/03/2011, most in keeping with a benign transient perfusional phenomenon. Otherwise normal liver, with no liver mass. Focal thickening at the gallbladder fundus is unchanged since 02/18/2012, and is in keeping with focal adenomyomatosis. Otherwise normal gallbladder, with no radiopaque cholelithiasis. No biliary ductal dilatation.  Pancreas: Normal.  Spleen: Normal.  Adrenals/Urinary Tract: Normal right adrenal. Left adrenal 1.0 cm nodule (2/61) with indeterminate CT density, unchanged in size since 10/03/2011, probably benign. Normal kidneys with no hydronephrosis and no renal mass. Minimally distended and grossly normal urinary bladder.  Stomach/Bowel: Grossly normal stomach. Normal  caliber small bowel with no small bowel wall thickening. Normal appendix. Mild sigmoid diverticulosis.  Vascular/Lymphatic: No abdominal or pelvic lymphadenopathy. Atherosclerotic nonaneurysmal thoracic aorta.  Reproductive: Grossly normal uterus.  No adnexal abnormality.  Other: No pneumoperitoneum, ascites or focal fluid collection.  Musculoskeletal: Moderate degenerative changes in the lumbar spine. Asymmetric prominent left hip osteoarthritis. An indistinct sclerotic focus in the anterior left iliac bone (2/93) is unchanged back to 04/16/2004 and likely benign. Non expansile sclerotic osseous lesion in the left sacrum (2/96), which is unchanged in the interval and is increased in density compared to 10/03/11, in keeping with a treated metastasis. No new suspicious focal osseous lesions.  IMPRESSION: 1. Two stable subcentimeter right pulmonary nodules. 2. Stable sclerotic left sacral osseous metastasis. 3. Otherwise no evidence of metastatic disease in the chest, abdomen or pelvis. 4. RECIST findings as above.   Electronically Signed   By: Jason A Poff M.D.   On: 02/21/2015 15:10      ASSESSMENT: 64 y.o. Parks, Barbara woman with stage IV breast cancer, on BOLERO-4 trial  (1) status post right mastectomy and sentinel lymph node sampling 03/22/2004 for a pT2 pN1, stage IIB invasive ductal carcinoma with lobular features, grade 2, estrogen receptor and progesterone receptor positive, HER-2 negative, with an MIB-1 of 9% ([id number] and [id number])  (2) addition all right axillary lymph node sampling 05/07/2004 showed 2 benign lymph nodes (4 lymph nodes previously removed, so total was one positive lymph node out of 6; [id number])  (3) the patient was evaluated by radiation oncology; no postmastectomy radiation recommended  (4) adjuvant chemotherapy with dose dense doxorubicin and cyclophosphamide x4   cycles (first cycle delayed one week) followed by dose dense paclitaxel x4 was completed 09/18/2004  (5) tamoxifen  started March 2006, discontinued 2009  METASTATIC DISEASE: (6) presenting with a large pericardial effusion, large right pleural effusion and possible right middle lobe bronchial obstruction February 2013, status post pericardial window placement, fiberoptic bronchoscopy and right Pleurx placement 09/11/2011, with biopsy of the bronchus intermedius and pericardium positive for metastatic breast cancer, estrogen receptor 91% positive with moderate staining intensity, progesterone receptor 100% positive with strong staining intensity, with an MIB-1 of 35%, and no HER-2 amplification, the signals ratio being 1.37 ([id number])  (7) enrolled in BOLERO-4 trial 10/10/2011, receiving letrozole and everolimus  (a) two small areas of enhancement in the cerebellum noted by brain MRI 10/03/2011 were no longer apparent on repeat MRI 08/21/2012-- most recent brain MRI 04/01/2014 showed no evidence of intracranial metastatic disease  (b) sclerotic lesions in left iliac bone and sacrum have not been biopsied; stable; plan is to start zolendronate after patient updates her dental care (extraction planned)  (c) RLL lung nodule, stable (rescanned 07/12/2014 and 08/11/2014) R hilar and subcarinal lymph nodes: stable  (9) additional problems:  (a) hepatic steatosis  (b) COPD/ emphysema/ asthma  (c) advanced L hip osteoarthritis  (d) aortoiliac atherosclerosis  (e) dental evaluation pending w possible dental extractions  (f) possible thalassemia trait  (10) Bone density concerns: DEXA scan 08/11/2013 was normal   PLAN: Jay Schlichter to do remarkably well and we are making no changes in her treatment. More specifically she is going to continue the everolimus right through her surgery, which is scheduled for November 9.  As far as the diarrhea she has once or twice a week, when there is due to diet or the everolimus is unclear to me but she manages it very well taking an occasional Imodium.  She will see her  dentist again 03/08/2015. We are waiting until her dental work completely finish this before considering zolendronate. She is also due for repeat mammography late August of this year.  She will see [place] again as per protocol. She knows to call for any problems that may develop before that visit. Chauncey Cruel, MD   02/22/2015 9:24 AM

## 2015-02-23 ENCOUNTER — Ambulatory Visit: Payer: Medicare Other | Admitting: Nurse Practitioner

## 2015-02-23 ENCOUNTER — Other Ambulatory Visit: Payer: Medicare Other

## 2015-02-23 IMAGING — CT CT CHEST W/ CM
2 of 4 series · 16 of 46 positions shown, 18 images · IV contrast (OMNIPAQUE)
Comparison: 06/15/2013

CLINICAL DATA: Followup metastatic breast carcinoma. Restaging.
Recist 1.0 protocol.

EXAM:
CT CHEST, ABDOMEN, AND PELVIS WITH CONTRAST
TECHNIQUE: Multidetector CT imaging of the chest, abdomen and pelvis was
performed following the standard protocol during bolus
administration of intravenous contrast.
CONTRAST:  100mL OMNIPAQUE IOHEXOL 300 MG/ML  SOLN

[Series 2: cap with st · axial · 0.73mm/px · z∈[-508,+62]mm · 13 of 124 slices shown, 15 images]
[im 5/124  soft-tissue]
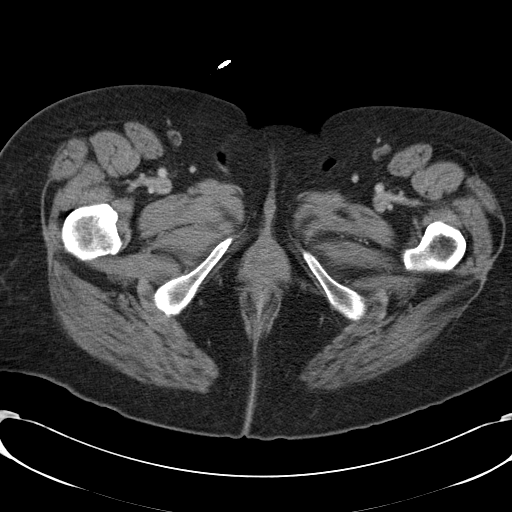
[im 5/124  bone]
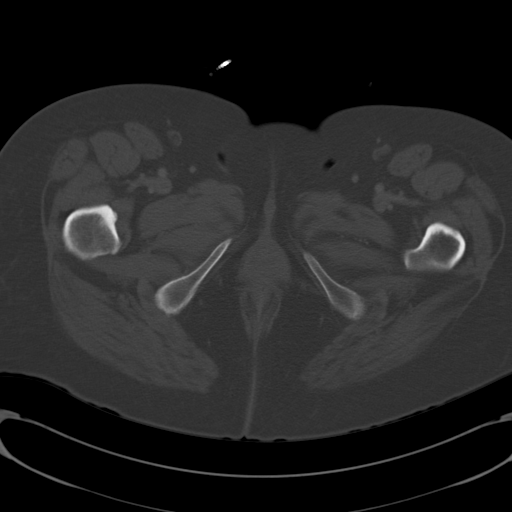
[im 15/124  soft-tissue]
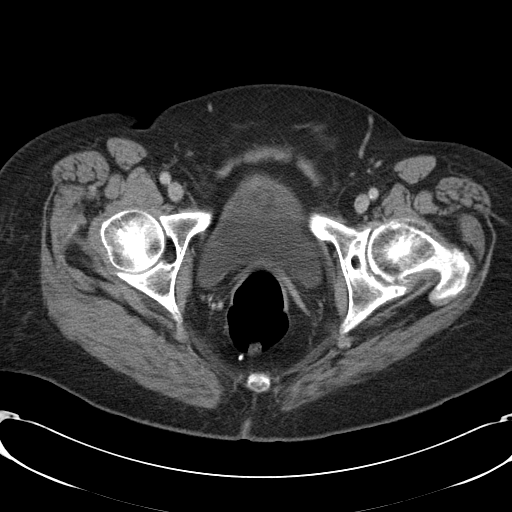
[im 25/124  soft-tissue]
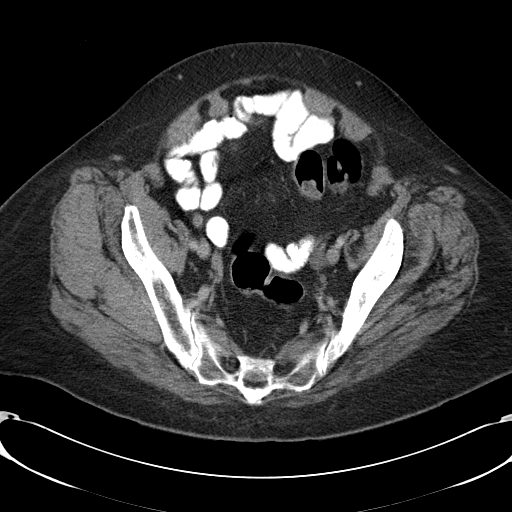
[im 35/124  soft-tissue]
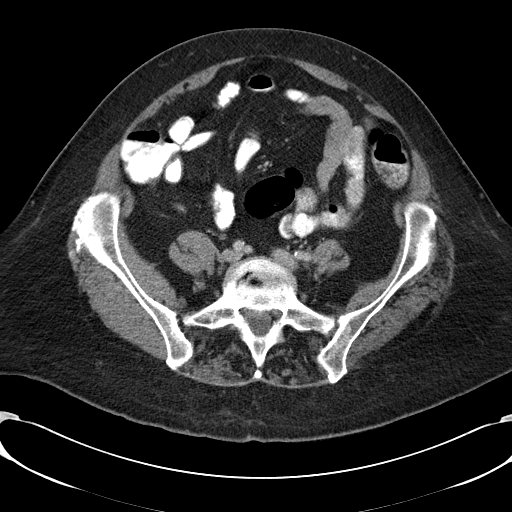
[im 45/124  soft-tissue]
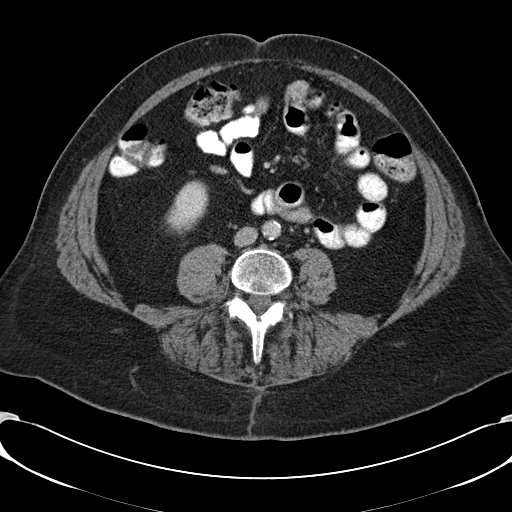
[im 55/124  soft-tissue]
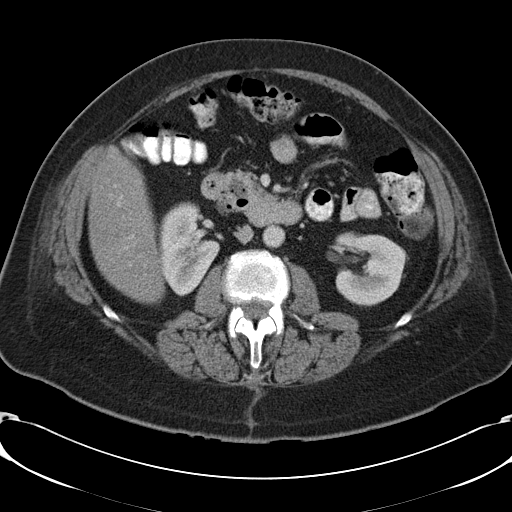
[im 64/124  soft-tissue]
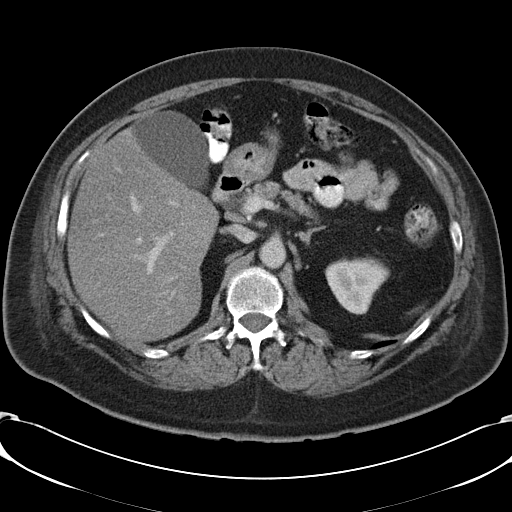
[im 69/124  soft-tissue]
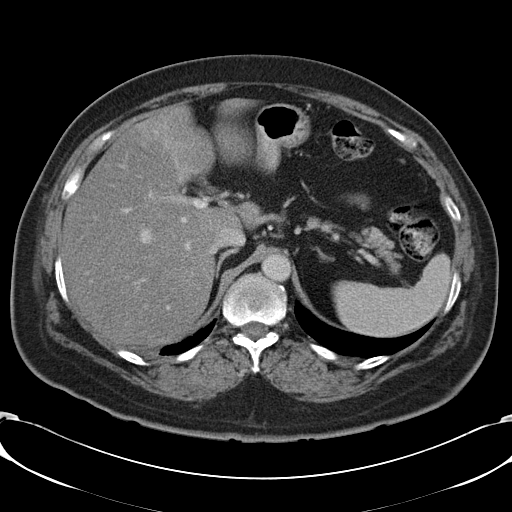
[im 79/124  soft-tissue]
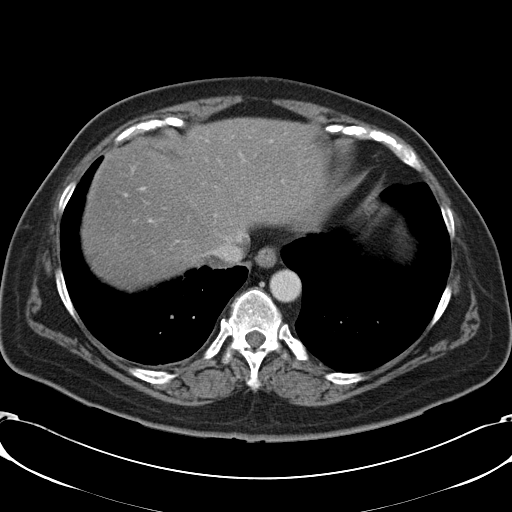
[im 79/124  bone]
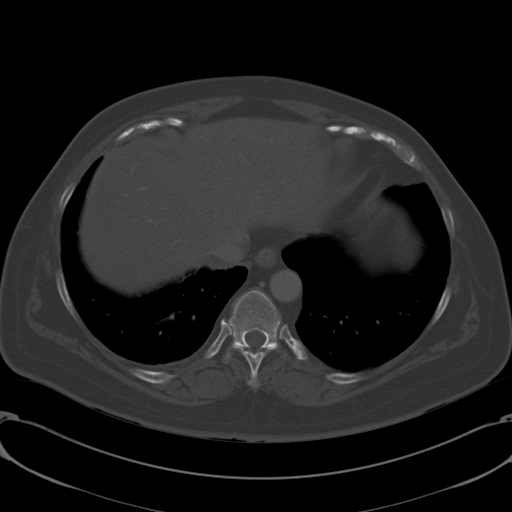
[im 89/124  soft-tissue]
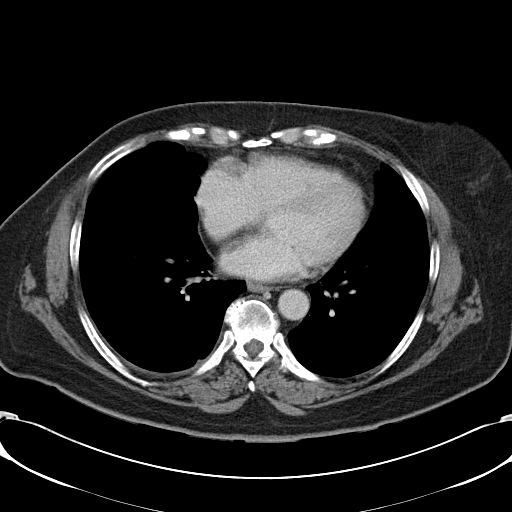
[im 99/124  soft-tissue]
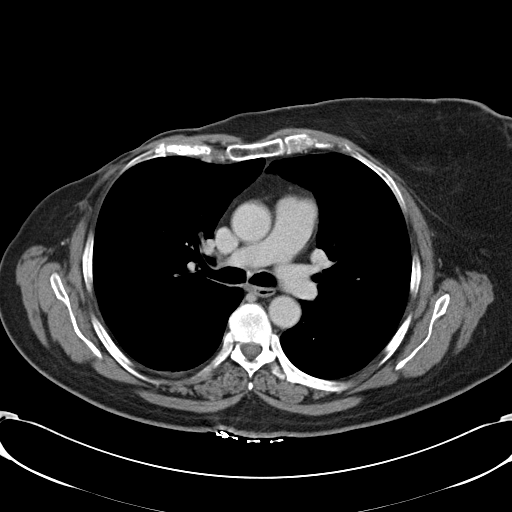
[im 109/124  soft-tissue]
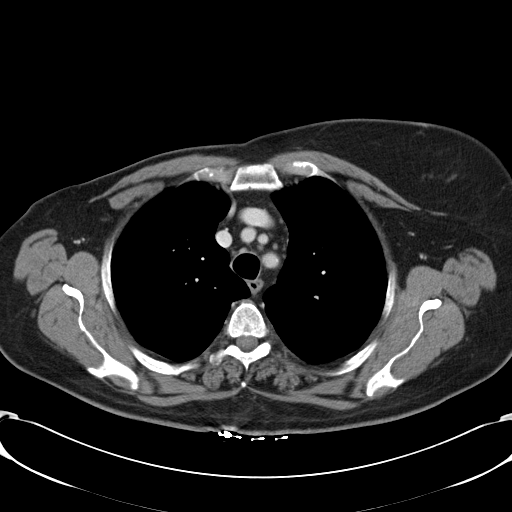
[im 119/124  soft-tissue]
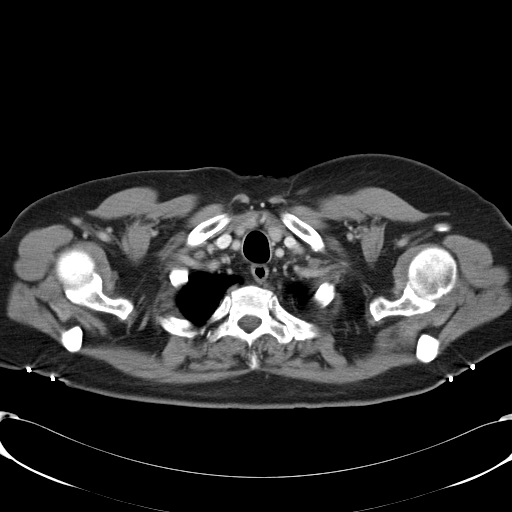

[Series 602: <mpr thick range> · coronal · 1.20mm/px · 3 of 116 slices shown]
[im 39/116  soft-tissue]
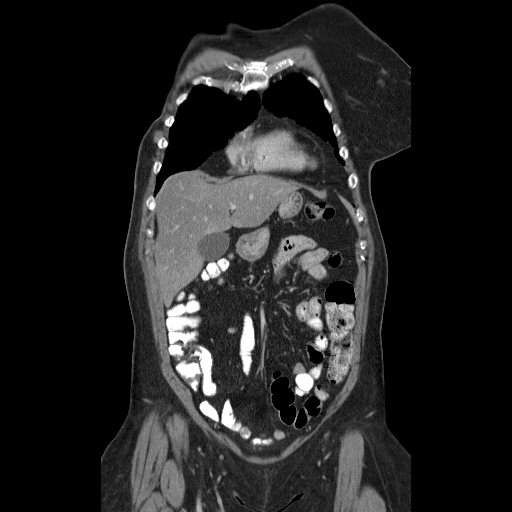
[im 52/116  soft-tissue]
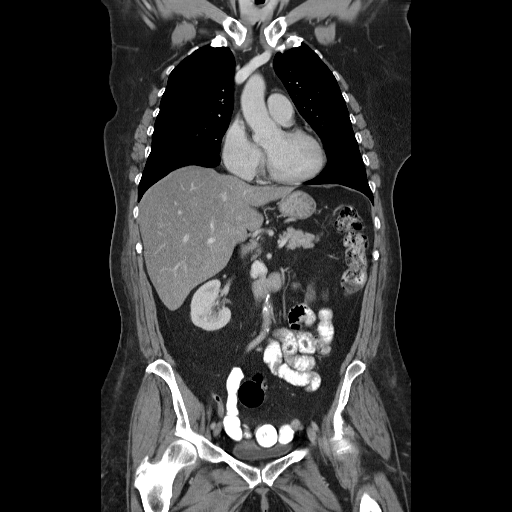
[im 64/116  soft-tissue]
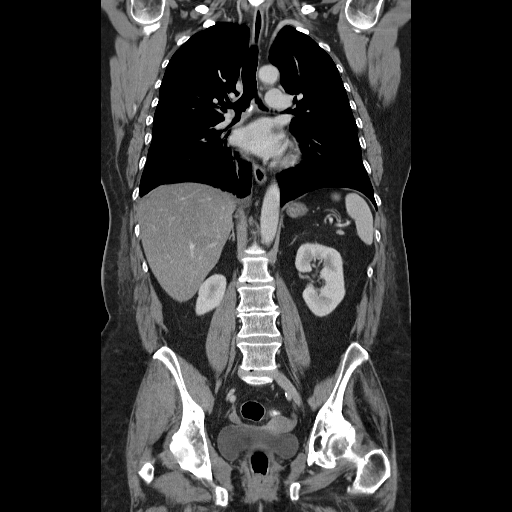

[16 of 46 positions shown; findings below may reference images not displayed]

FINDINGS: RECIST

Target Lesions:

1. Right lower lobe nodule no longer visualized on today's exam
2. 9 mm subcarinal lymph node on image 29 compared to 10 mm on prior
exam
3. 7 mm right hilar lymph node on image 27 which is unchanged
compared to prior exam
Non-target Lesions:    None

CT CHEST FINDINGS

Previous right mastectomy again noted. No evidence of axillary
lymphadenopathy. No evidence of hilar or mediastinal
lymphadenopathy. A partially calcified subcarinal mediastinal lymph
node is seen measuring 9 mm on image 29 compared to 10 mm
previously. A 7 mm right hilar lymph node is seen on image 27 which
is unchanged compared to 7 mm on previous study.

Mild scarring and posterior right lower lobe is unchanged.
Previously noted 5 mm pulmonary nodule in the right lower lobe is no
longer visualized. No other suspicious pulmonary nodules or masses
are identified.

No evidence of pleural or pericardial effusion. Sclerotic metastases
in the lower thoracic spine, left posterior tenth rib, and right
lateral second rib remains stable.

CT ABDOMEN AND PELVIS FINDINGS

Hepatic steatosis again demonstrated, however no liver masses are
identified. 10 mm polypoid lesion in the fundus of the gallbladder
on image 64 is unchanged. Gallbladder is otherwise unremarkable in
appearance there is no evidence of biliary ductal dilatation.

The pancreas, adrenal glands, spleen, and kidneys are normal in
appearance. No evidence of hydronephrosis.

No soft tissue masses or lymphadenopathy identified within the
abdomen or pelvis. Uterus and adnexal regions are unremarkable in
appearance. No evidence of inflammatory process or abnormal fluid
collections. No evidence of bowel wall thickening or dilatation.

Small sclerotic bone lesions in right iliac crest, left iliac wing,
right pubis, and left ischial tuberosity remain stable. Severe left
hip joint osteoarthritis again noted.
IMPRESSION: Stable bone metastases. No evidence of soft tissue metastatic
disease within the chest, abdomen, or pelvis.

Stable hepatic steatosis and probable 10 mm gallbladder polyp in the
fundus. Continued attention recommended on followup CTs.

## 2015-02-27 MED ORDER — INV-EVEROLIMUS (RAD0001) 5MG TABLET NOVARTIS CRAD001Y24135: 2.0000 | ORAL_TABLET | Freq: Every day | ORAL | Status: DC

## 2015-03-22 ENCOUNTER — Ambulatory Visit
Admission: RE | Admit: 2015-03-22 | Discharge: 2015-03-22 | Disposition: A | Discharge status: Home / Self Care | Payer: Medicare Other | Source: Ambulatory Visit

## 2015-03-22 ENCOUNTER — Other Ambulatory Visit: Payer: Self-pay | Admitting: Oncology

## 2015-03-22 ENCOUNTER — Other Ambulatory Visit: Payer: Self-pay | Admitting: *Deleted

## 2015-03-22 ENCOUNTER — Ambulatory Visit (HOSPITAL_BASED_OUTPATIENT_CLINIC_OR_DEPARTMENT_OTHER): Payer: Medicare Other | Admitting: Nurse Practitioner

## 2015-03-22 ENCOUNTER — Other Ambulatory Visit (HOSPITAL_BASED_OUTPATIENT_CLINIC_OR_DEPARTMENT_OTHER): Payer: Medicare Other

## 2015-03-22 ENCOUNTER — Encounter: Payer: Self-pay | Admitting: Nurse Practitioner

## 2015-03-22 ENCOUNTER — Other Ambulatory Visit (HOSPITAL_COMMUNITY)
Admission: RE | Admit: 2015-03-22 | Discharge: 2015-03-22 | Disposition: A | Discharge status: Home / Self Care | Payer: Medicare Other | Source: Ambulatory Visit | Attending: Oncology | Admitting: Oncology

## 2015-03-22 ENCOUNTER — Encounter: Payer: Self-pay | Admitting: *Deleted

## 2015-03-22 VITALS — BP 125/67 | HR 85 | Temp 97.5°F | Resp 18 | Ht 65.0 in | Wt 171.7 lb

## 2015-03-22 DIAGNOSIS — C7801 Secondary malignant neoplasm of right lung: Secondary | ICD-10-CM | POA: Diagnosis not present

## 2015-03-22 DIAGNOSIS — C50911 Malignant neoplasm of unspecified site of right female breast: Secondary | ICD-10-CM

## 2015-03-22 DIAGNOSIS — Z1231 Encounter for screening mammogram for malignant neoplasm of breast: Secondary | ICD-10-CM | POA: Diagnosis not present

## 2015-03-22 DIAGNOSIS — C7800 Secondary malignant neoplasm of unspecified lung: Principal | ICD-10-CM

## 2015-03-22 DIAGNOSIS — C50919 Malignant neoplasm of unspecified site of unspecified female breast: Secondary | ICD-10-CM | POA: Insufficient documentation

## 2015-03-22 DIAGNOSIS — Z006 Encounter for examination for normal comparison and control in clinical research program: Secondary | ICD-10-CM

## 2015-03-22 DIAGNOSIS — E782 Mixed hyperlipidemia: Secondary | ICD-10-CM | POA: Diagnosis not present

## 2015-03-22 LAB — COMPREHENSIVE METABOLIC PANEL (CC13)
ALT: 32 U/L (ref 0–55)
ANION GAP: 11 meq/L (ref 3–11)
AST: 37 U/L — ABNORMAL HIGH (ref 5–34)
Albumin: 3.3 g/dL — ABNORMAL LOW (ref 3.5–5.0)
Alkaline Phosphatase: 251 U/L — ABNORMAL HIGH (ref 40–150)
BILIRUBIN TOTAL: 0.27 mg/dL (ref 0.20–1.20)
BUN: 18.6 mg/dL (ref 7.0–26.0)
CO2: 18 mEq/L — ABNORMAL LOW (ref 22–29)
CREATININE: 0.9 mg/dL (ref 0.6–1.1)
Calcium: 9.7 mg/dL (ref 8.4–10.4)
Chloride: 110 mEq/L — ABNORMAL HIGH (ref 98–109)
EGFR: 67 mL/min/{1.73_m2} — ABNORMAL LOW (ref >90)
Glucose: 133 mg/dl (ref 70–140)
Potassium: 3.8 mEq/L (ref 3.5–5.1)
Sodium: 139 mEq/L (ref 136–145)
TOTAL PROTEIN: 7.2 g/dL (ref 6.4–8.3)

## 2015-03-22 LAB — URIC ACID (CC13): Uric Acid, Serum: 2.9 mg/dl (ref 2.6–7.4)

## 2015-03-22 LAB — CK: Total CK: 80 U/L (ref 38–234)

## 2015-03-22 LAB — LIPID PANEL
Cholesterol: 234 mg/dL — ABNORMAL HIGH (ref 125–200)
HDL: 46 mg/dL (ref >=46)
LDL CALC: 148 mg/dL — AB (ref <130)
Total CHOL/HDL Ratio: 5.1 Ratio — ABNORMAL HIGH (ref <=5.0)
Triglycerides: 200 mg/dL — ABNORMAL HIGH (ref <150)
VLDL: 40 mg/dL — ABNORMAL HIGH (ref <30)

## 2015-03-22 LAB — CBC WITH DIFFERENTIAL/PLATELET
BASO%: 0.6 % (ref 0.0–2.0)
Basophils Absolute: 0 10*3/uL (ref 0.0–0.1)
EOS%: 1.6 % (ref 0.0–7.0)
Eosinophils Absolute: 0.1 10*3/uL (ref 0.0–0.5)
HEMATOCRIT: 38.1 % (ref 34.8–46.6)
HEMOGLOBIN: 12.8 g/dL (ref 11.6–15.9)
LYMPH%: 17.5 % (ref 14.0–49.7)
MCH: 26.7 pg (ref 25.1–34.0)
MCHC: 33.6 g/dL (ref 31.5–36.0)
MCV: 79.5 fL (ref 79.5–101.0)
MONO#: 0.8 10*3/uL (ref 0.1–0.9)
MONO%: 11.9 % (ref 0.0–14.0)
NEUT%: 68.4 % (ref 38.4–76.8)
NEUTROS ABS: 4.6 10*3/uL (ref 1.5–6.5)
PLATELETS: 280 10*3/uL (ref 145–400)
RBC: 4.79 10*6/uL (ref 3.70–5.45)
RDW: 14.2 % (ref 11.2–14.5)
WBC: 6.8 10*3/uL (ref 3.9–10.3)
lymph#: 1.2 10*3/uL (ref 0.9–3.3)

## 2015-03-22 LAB — PHOSPHORUS: PHOSPHORUS: 2.7 mg/dL (ref 2.5–4.6)

## 2015-03-22 LAB — BILIRUBIN, DIRECT

## 2015-03-22 MED ORDER — INV-EVEROLIMUS (RAD0001) 5MG TABLET NOVARTIS CRAD001Y24135: 2.0000 | ORAL_TABLET | Freq: Every day | ORAL | Status: DC

## 2015-03-22 NOTE — Progress Notes (Signed)
[facility]  Telephone:([phone]) [phone] Fax:(336) [phone]     ID: Barbara Parks DOB: 07/04/1951  MR#: [id number]  ZWC#:[id number]  Patient Care Team: Darcus Austin, MD as PCP - General (Family Medicine) Jerline Pain, MD (Cardiology) Gaye Pollack, MD (Cardiothoracic Surgery) Chauncey Cruel, MD as Consulting Physician (Oncology) OTHER MD: Dr Erroll Luna- Merlene Laughter, DDS; Gaynelle Arabian MD  CHIEF COMPLAINT: metastatic breast cancer, on BOLERO study  CURRENT TREATMENT: letrozole + everolimus  BREAST CANCER HISTORY: From Dr. Collier [name]'s original intake note 04/13/2004:  "This woman has been in good health all of her life. She recently moved from [place] to work here.  She palpated a mass at about the 12 o'clock position in mid-July. She has not noticed any nipple retraction or skin changes.  She was seen by her primary care doctor who subsequently referred her for a mammogram.  Mammogram was performed on 03/01/04. This demonstrated a spiculated 2 cm mass at the 12 o'clock position in the right breast.  Upper outer quadrant of the left breast shows some distortion.   Physical exam at that time showed a firm, nontender nodule at the 12 o'clock position in the right breast, 5 cm from the nipple.  Ultrasound of this area showed a hypoechoic ill-defined mass, measuring 2.2 x 1.3 x 1.4 cm.  Physical exam of the left breast showed general vague thickening, upper outer quadrant of the left breast with a discrete palpable mass. The ultrasound performed showed a single hypoechoic ill-defined nodule at the 1 o'clock position, measuring 7 x 5 x 6 mm.  She had biopsies of both lesions on 03/02/03.  Needle core biopsy of the lesion on the right breast revealed invasive mammary carcinoma.  Needle core biopsy of the left breast showed a complex fibroadenoma.  Prognostic panel of the lesion on the right breast showed it to be ER positive at 73%, PR positive at 90% and proliferative index 9%, HER-2  was 1+.  Patient was referred to Dr. Annamaria Boots, who performed a simple mastectomy with sentinel lymph node evaluation on 03/22/04.  Final pathology showed this to be an invasive ductal carcinoma  with lobular features, measuring 2.2 cm, grade 2 of 3.  Margins negative for carcinoma.  Invasive ductal carcinoma was extended to involve deep dermis of the nipple.  Lymphovascular invasion was identified.  Total of 4 sentinel lymph nodes were evaluated.  Touch imprints at the time of the OR was felt to be negative.  Subsequent evaluation showed a 5 mm focus of metastatic carcinoma in one of the four lymph nodes on microscopic after sectioning.  There was extracapsular extension  of one lymph node as well. The remaining three lymph nodes were all negative."  The patient's subsequent history is detailed below.  INTERVAL HISTORY: Barbara Parks returns today for follow up of her metastatic breast cancer , accompanied by her research nurse [name]. Barbara Parks continues to participate in the [place] trial, currently on cycle 46. She continues on everolimus and letrozole. She tolerates both drug well with few complaints. She has mild hot flashes at night, but no vaginal changes. She also denies mucositis or pneumonitis symptoms.  REVIEW OF SYSTEMS: Last week Barbara Parks thinks she caught a "stomach bug" she had tremendous nausea for 6 days, that was finally relieved when she started taking her compazine. She never actually vomited. She denies fevers or chills. The diarrhea she described at her last visit has resolved itself, and she is no longer having to take imodium.  She has recently been to the dentist for 2 fillings and returns in October for a follow up visit. She has continued chronic lower back and left hip pain. She anticipates a hip replacement by Dr. Maureen Ralphs in early November. She denies shortness of breath, chest pain, cough, or palpitations. She has no headaches, dizziness, or vision changes, but does have some mild hearing  loss. A detailed review of systems is otherwise stable. A detailed review of systems today was otherwise noncontributory  PAST MEDICAL HISTORY: Past Medical History  Diagnosis Date  . Asthma   . Shortness of breath   . Arthritis     left hip  . Contact dermatitis     from a bandaid that had Latex   . Osteopenia due to cancer therapy 09/09/2013  . Hypercholesteremia   . ADHD (attention deficit hyperactivity disorder)   . breast ca 2005    breast/chemo R mastectomy  . Metastasis to lung dx'd 08/2011    PAST SURGICAL HISTORY: Past Surgical History  Procedure Laterality Date  . Spine surgery  1996  . Breast surgery  2005    right  . Video bronchoscopy  09/11/2011    Procedure: VIDEO BRONCHOSCOPY;  Surgeon: Gaye Pollack, MD;  Location: [facility];  Service: Thoracic;  Laterality: N/A;  . Chest tube insertion  09/11/2011    Procedure: INSERTION PLEURAL DRAINAGE CATHETER;  Surgeon: Gaye Pollack, MD;  Location: [facility];  Service: Thoracic;  Laterality: Right;  . Pericardial window  09/11/2011    Procedure: PERICARDIAL WINDOW;  Surgeon: Gaye Pollack, MD;  Location: [facility];  Service: Thoracic;  Laterality: N/A;  . Removal of pleural drainage catheter  12/19/2011    Procedure: REMOVAL OF PLEURAL DRAINAGE CATHETER;  Surgeon: Gaye Pollack, MD;  Location: [facility];  Service: Thoracic;  Laterality: Right;  TO BE DONE IN MINOR ROOM, SHORT STAY  . Cataract extraction w/phaco Left 01/18/2014    Procedure: CATARACT EXTRACTION PHACO AND INTRAOCULAR LENS PLACEMENT (IOC);  Surgeon: Elta Guadeloupe T. Gershon Crane, MD;  Location: [facility];  Service: Ophthalmology;  Laterality: Left;  CDE 15.79  . Cataract extraction w/phaco Right 02/08/2014    Procedure: CATARACT EXTRACTION PHACO AND INTRAOCULAR LENS PLACEMENT (IOC);  Surgeon: Elta Guadeloupe T. Gershon Crane, MD;  Location: [facility];  Service: Ophthalmology;  Laterality: Right;  CDE 4.41    FAMILY HISTORY Family History  Problem Relation Age of Onset  . Cancer Mother     breast  . Heart  disease Father   . Anesthesia problems Neg Hx    the patient's mother was diagnosed with breast cancer at age 108, she died age 23 from congestive heart failure..The patient's father died from heart disease at age 18.  She has one sister alive & well.  Two brothers alive & well, one with diabetes. The patient's sister was also diagnosed with breast cancer, and was tested for the BRCA gene, and was negative. The patient herself has not been tested. There is no history of ovarian cancer in the family  GYNECOLOGIC HISTORY:  No LMP recorded. Patient is postmenopausal. Menarche age 8, the patient is GX P0. She stopped having periods with chemotherapy in 2005. 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 She was a Archivist. She is single, lives alone with her Shitzu-poodle Sammie.. Family is all in the [place] area.     ADVANCED DIRECTIVES:  Not in place. At the 02/23/2014 visit the patient was given the appropriate documents to complete and notarize at her discretion. She tells me she is planning to name her sister, Billie Ruddy, as healthcare power of attorney. [name] can be reached at [phone]   HEALTH MAINTENANCE: Social History  Substance Use Topics  . Smoking status: Former Smoker -- 1.50 packs/day for 30 years    Types: Cigarettes    Quit date: 09/10/2007  . Smokeless tobacco: Not on file  . Alcohol Use: No     Colonoscopy:  PAP:  Bone density: 08/11/2013, showed osteopenia  Lipid panel:  Allergies  Allergen Reactions  . Aspirin     REACTION: upset stomach  . Latex Other (See Comments)    Blistering and skin peels off  . Nsaids Nausea And Vomiting    Extreme nausea and vomiting    Current Outpatient Prescriptions  Medication Sig Dispense Refill  . gemfibrozil (LOPID) 600 MG tablet TAKE ONE TABLET BY MOUTH TWICE DAILY BEFORE A MEAL 60 tablet 3  .  Investigational everolimus (RAD001) 5 MG tablet Novartis [id number] Take 2 tablets by mouth daily. Take with a glass of water. 70 tablet 0  . letrozole (FEMARA) 2.5 MG tablet Take 1 tablet (2.5 mg total) by mouth daily. 30 tablet 0  . metoprolol tartrate (LOPRESSOR) 25 MG tablet Take 0.5 tablets (12.5 mg total) by mouth daily. 30 tablet 4  . omeprazole (PRILOSEC) 40 MG capsule Take 1 capsule (40 mg total) by mouth daily as needed. 90 capsule 3  . prochlorperazine (COMPAZINE) 10 MG tablet Take 1 tablet (10 mg total) by mouth every 6 (six) hours as needed (Nausea or vomiting). 30 tablet 1  . cholecalciferol (VITAMIN D) 1000 UNITS tablet Take 2,000 Units by mouth daily.     . Investigational everolimus (RAD001) 5 MG tablet Novartis [id number] Take 2 tablets by mouth daily. Take with a glass of water. 70 tablet 0  . loperamide (IMODIUM) 2 MG capsule Take 2 mg by mouth 4 (four) times daily as needed for diarrhea or loose stools.     No current facility-administered medications for this visit.    OBJECTIVE: Middle-aged white woman who appears stated age 2 Vitals:   03/22/15 0851  BP: 125/67  Pulse:   Temp:   Resp:      Body mass index is 28.57 kg/(m^2).    ECOG FS:1 - Symptomatic but completely ambulatory Skin: warm, dry  HEENT: sclerae anicteric, conjunctivae pink, oropharynx clear. No thrush or mucositis.  Lymph Nodes: No cervical or supraclavicular lymphadenopathy  Lungs: clear to auscultation bilaterally, no rales, wheezes, or rhonci  Heart: regular rate and rhythm  Abdomen: round, soft, non tender, positive bowel sounds  Musculoskeletal: No focal spinal tenderness, no peripheral edema, ambulates with cane Neuro: non focal, well oriented, positive affect  Breasts: deferred  LAB RESULTS:  CMP     Component Value Date/Time   NA 139 03/22/2015 0831   NA 136 07/16/2012 0910   K 3.8 03/22/2015 0831   K 4.0 07/16/2012 0910   CL 109* 12/31/2012 0940   CL 103 07/16/2012 0910     CO2 18* 03/22/2015 0831   CO2 21 07/16/2012 0910   GLUCOSE 133 03/22/2015 0831   GLUCOSE 127* 12/31/2012 0940   GLUCOSE 140* 07/16/2012 0910   BUN 18.6 03/22/2015 0831   BUN 16 07/16/2012 0910   CREATININE 0.9 03/22/2015 0831   CREATININE 0.75 07/16/2012 0910   CALCIUM 9.7 03/22/2015 0831   CALCIUM  10.1 07/16/2012 0910   PROT 7.2 03/22/2015 0831   PROT 8.0 07/16/2012 0910   ALBUMIN 3.3* 03/22/2015 0831   ALBUMIN 3.7 07/16/2012 0910   AST 37* 03/22/2015 0831   AST 30 07/16/2012 0910   ALT 32 03/22/2015 0831   ALT 33 07/16/2012 0910   ALKPHOS 251* 03/22/2015 0831   ALKPHOS 142* 07/16/2012 0910   BILITOT 0.27 03/22/2015 0831   BILITOT 0.2* 07/16/2012 0910   GFRNONAA >90 09/13/2011 0440   GFRAA >90 09/13/2011 0440    I No results found for: SPEP  Lab Results  Component Value Date   WBC 6.8 03/22/2015   NEUTROABS 4.6 03/22/2015   HGB 12.8 03/22/2015   HCT 38.1 03/22/2015   MCV 79.5 03/22/2015   PLT 280 03/22/2015      Chemistry      Component Value Date/Time   NA 139 03/22/2015 0831   NA 136 07/16/2012 0910   K 3.8 03/22/2015 0831   K 4.0 07/16/2012 0910   CL 109* 12/31/2012 0940   CL 103 07/16/2012 0910   CO2 18* 03/22/2015 0831   CO2 21 07/16/2012 0910   BUN 18.6 03/22/2015 0831   BUN 16 07/16/2012 0910   CREATININE 0.9 03/22/2015 0831   CREATININE 0.75 07/16/2012 0910      Component Value Date/Time   CALCIUM 9.7 03/22/2015 0831   CALCIUM 10.1 07/16/2012 0910   ALKPHOS 251* 03/22/2015 0831   ALKPHOS 142* 07/16/2012 0910   AST 37* 03/22/2015 0831   AST 30 07/16/2012 0910   ALT 32 03/22/2015 0831   ALT 33 07/16/2012 0910   BILITOT 0.27 03/22/2015 0831   BILITOT 0.2* 07/16/2012 0910       Lab Results  Component Value Date   LABCA2 58* 09/14/2012    No components found for: [id number]  No results for input(s): INR in the last 168 hours.  Urinalysis    Component Value Date/Time   COLORURINE YELLOW 09/11/2011 [place]  09/11/2011 0819   LABSPEC 1.020 09/08/2014 0956   LABSPEC 1.013 09/11/2011 0819   PHURINE 6.0 09/08/2014 0956   PHURINE 6.5 09/11/2011 0819   GLUCOSEU Negative 09/08/2014 0956   GLUCOSEU NEGATIVE 09/11/2011 0819   HGBUR Large 09/08/2014 0956   HGBUR SMALL* 09/11/2011 0819   BILIRUBINUR Negative 09/08/2014 0956   BILIRUBINUR NEGATIVE 09/11/2011 0819   KETONESUR Negative 09/08/2014 0956   KETONESUR 15* 09/11/2011 0819   PROTEINUR 30 09/08/2014 0956   PROTEINUR NEGATIVE 09/11/2011 0819   UROBILINOGEN 0.2 09/08/2014 0956   UROBILINOGEN 0.2 09/11/2011 0819   NITRITE Negative 09/08/2014 0956   NITRITE NEGATIVE 09/11/2011 0819   LEUKOCYTESUR Negative 09/08/2014 0956   LEUKOCYTESUR NEGATIVE 09/11/2011 0819    STUDIES: Ct Chest W Contrast  02/21/2015   CLINICAL DATA:  64 year old female presenting for restaging of right breast cancer diagnosed in 2005 status post right mastectomy, with lung metastases diagnosed in 2013, with ongoing oral chemotherapy.  EXAM: CT CHEST, ABDOMEN, AND PELVIS WITH CONTRAST  TECHNIQUE: Multidetector CT imaging of the chest, abdomen and pelvis was performed following the standard protocol during bolus administration of intravenous contrast.  CONTRAST:  191m OMNIPAQUE IOHEXOL 300 MG/ML  SOLN  COMPARISON:  Most recent CT of the chest, abdomen and pelvis from 12/27/2014.  FINDINGS: RECIST  Target Lesions:  1. Right lower lobe lung nodule:  0.6 cm, previously 0.6 cm 2. Subcarinal lymph node: 0.6 cm, previously 0.6 cm, with stable coarse internal calcifications 3. Right hilar lymph node:  0.7 cm, previously 0.8 cm Non-target Lesions:  1. Small right pleural effusion: Resolved (no current right pleural effusion) 2. Superior segment right lower lobe lung nodule: Resolved (the superior segment right lower lobe lung nodule described on the baseline 10/03/2011 chest CT study is not apparent on the current scan, as on the prior chest CT)  CT CHEST FINDINGS  Mediastinum/Nodes: Normal  heart size, with no pericardial fluid or thickening. Great vessels are normal in course and calibre. No central pulmonary emboli. Normal visualized thyroid. Normal esophagus. Right axillary surgical clips. No axillary lymphadenopathy. No mediastinal or hilar lymphadenopathy by size criteria. Coarse calcifications within the subcentimeter subcarinal node, unchanged, likely from prior granulomatous disease.  Lungs/Pleura: No pleural effusion. No pneumothorax. Peripheral right upper lobe 2 mm pulmonary nodule (series 4/image 22), not appreciably changed back to 10/03/2011, probably benign. Stable 0.6 cm right lower lobe pulmonary nodule (4/41). Stable mild scarring in the dependent right lower lobe. No new significant pulmonary nodules. No new focal lung consolidation.  Musculoskeletal: Status post right mastectomy. Mild degenerative changes in the thoracic spine, with no suspicious focal osseous lesions in the chest.  CT ABDOMEN AND PELVIS FINDINGS  Hepatobiliary: There is mildly heterogeneous diffuse hepatic steatosis. Re- demonstrated is an ill defined 1.3 cm hyperenhancing focus in the right liver lobe (series 2/image 55), which is nearly imperceptible on the delayed sequence, and is unchanged back to 10/03/2011, most in keeping with a benign transient perfusional phenomenon. Otherwise normal liver, with no liver mass. Focal thickening at the gallbladder fundus is unchanged since 02/18/2012, and is in keeping with focal adenomyomatosis. Otherwise normal gallbladder, with no radiopaque cholelithiasis. No biliary ductal dilatation.  Pancreas: Normal.  Spleen: Normal.  Adrenals/Urinary Tract: Normal right adrenal. Left adrenal 1.0 cm nodule (2/61) with indeterminate CT density, unchanged in size since 10/03/2011, probably benign. Normal kidneys with no hydronephrosis and no renal mass. Minimally distended and grossly normal urinary bladder.  Stomach/Bowel: Grossly normal stomach. Normal caliber small bowel with no  small bowel wall thickening. Normal appendix. Mild sigmoid diverticulosis.  Vascular/Lymphatic: No abdominal or pelvic lymphadenopathy. Atherosclerotic nonaneurysmal thoracic aorta.  Reproductive: Grossly normal uterus.  No adnexal abnormality.  Other: No pneumoperitoneum, ascites or focal fluid collection.  Musculoskeletal: Moderate degenerative changes in the lumbar spine. Asymmetric prominent left hip osteoarthritis. An indistinct sclerotic focus in the anterior left iliac bone (2/93) is unchanged back to 04/16/2004 and likely benign. Non expansile sclerotic osseous lesion in the left sacrum (2/96), which is unchanged in the interval and is increased in density compared to 10/03/11, in keeping with a treated metastasis. No new suspicious focal osseous lesions.  IMPRESSION: 1. Two stable subcentimeter right pulmonary nodules. 2. Stable sclerotic left sacral osseous metastasis. 3. Otherwise no evidence of metastatic disease in the chest, abdomen or pelvis. 4. RECIST findings as above.   Electronically Signed   By: Ilona Sorrel M.D.   On: 02/21/2015 15:10   Ct Abdomen Pelvis W Contrast  02/21/2015   CLINICAL DATA:  64 year old female presenting for restaging of right breast cancer diagnosed in 2005 status post right mastectomy, with lung metastases diagnosed in 2013, with ongoing oral chemotherapy.  EXAM: CT CHEST, ABDOMEN, AND PELVIS WITH CONTRAST  TECHNIQUE: Multidetector CT imaging of the chest, abdomen and pelvis was performed following the standard protocol during bolus administration of intravenous contrast.  CONTRAST:  11m OMNIPAQUE IOHEXOL 300 MG/ML  SOLN  COMPARISON:  Most recent CT of the chest, abdomen and pelvis from 12/27/2014.  FINDINGS: RECIST  Target  Lesions:  1. Right lower lobe lung nodule:  0.6 cm, previously 0.6 cm 2. Subcarinal lymph node: 0.6 cm, previously 0.6 cm, with stable coarse internal calcifications 3. Right hilar lymph node:  0.7 cm, previously 0.8 cm Non-target Lesions:  1. Small  right pleural effusion: Resolved (no current right pleural effusion) 2. Superior segment right lower lobe lung nodule: Resolved (the superior segment right lower lobe lung nodule described on the baseline 10/03/2011 chest CT study is not apparent on the current scan, as on the prior chest CT)  CT CHEST FINDINGS  Mediastinum/Nodes: Normal heart size, with no pericardial fluid or thickening. Great vessels are normal in course and calibre. No central pulmonary emboli. Normal visualized thyroid. Normal esophagus. Right axillary surgical clips. No axillary lymphadenopathy. No mediastinal or hilar lymphadenopathy by size criteria. Coarse calcifications within the subcentimeter subcarinal node, unchanged, likely from prior granulomatous disease.  Lungs/Pleura: No pleural effusion. No pneumothorax. Peripheral right upper lobe 2 mm pulmonary nodule (series 4/image 22), not appreciably changed back to 10/03/2011, probably benign. Stable 0.6 cm right lower lobe pulmonary nodule (4/41). Stable mild scarring in the dependent right lower lobe. No new significant pulmonary nodules. No new focal lung consolidation.  Musculoskeletal: Status post right mastectomy. Mild degenerative changes in the thoracic spine, with no suspicious focal osseous lesions in the chest.  CT ABDOMEN AND PELVIS FINDINGS  Hepatobiliary: There is mildly heterogeneous diffuse hepatic steatosis. Re- demonstrated is an ill defined 1.3 cm hyperenhancing focus in the right liver lobe (series 2/image 55), which is nearly imperceptible on the delayed sequence, and is unchanged back to 10/03/2011, most in keeping with a benign transient perfusional phenomenon. Otherwise normal liver, with no liver mass. Focal thickening at the gallbladder fundus is unchanged since 02/18/2012, and is in keeping with focal adenomyomatosis. Otherwise normal gallbladder, with no radiopaque cholelithiasis. No biliary ductal dilatation.  Pancreas: Normal.  Spleen: Normal.   Adrenals/Urinary Tract: Normal right adrenal. Left adrenal 1.0 cm nodule (2/61) with indeterminate CT density, unchanged in size since 10/03/2011, probably benign. Normal kidneys with no hydronephrosis and no renal mass. Minimally distended and grossly normal urinary bladder.  Stomach/Bowel: Grossly normal stomach. Normal caliber small bowel with no small bowel wall thickening. Normal appendix. Mild sigmoid diverticulosis.  Vascular/Lymphatic: No abdominal or pelvic lymphadenopathy. Atherosclerotic nonaneurysmal thoracic aorta.  Reproductive: Grossly normal uterus.  No adnexal abnormality.  Other: No pneumoperitoneum, ascites or focal fluid collection.  Musculoskeletal: Moderate degenerative changes in the lumbar spine. Asymmetric prominent left hip osteoarthritis. An indistinct sclerotic focus in the anterior left iliac bone (2/93) is unchanged back to 04/16/2004 and likely benign. Non expansile sclerotic osseous lesion in the left sacrum (2/96), which is unchanged in the interval and is increased in density compared to 10/03/11, in keeping with a treated metastasis. No new suspicious focal osseous lesions.  IMPRESSION: 1. Two stable subcentimeter right pulmonary nodules. 2. Stable sclerotic left sacral osseous metastasis. 3. Otherwise no evidence of metastatic disease in the chest, abdomen or pelvis. 4. RECIST findings as above.   Electronically Signed   By: Ilona Sorrel M.D.   On: 02/21/2015 15:10      ASSESSMENT: 64 y.o. [place], [place] woman with stage IV breast cancer, on BOLERO-4 trial  (1) status post right mastectomy and sentinel lymph node sampling 03/22/2004 for a pT2 pN1, stage IIB invasive ductal carcinoma with lobular features, grade 2, estrogen receptor and progesterone receptor positive, HER-2 negative, with an MIB-1 of 9% ([id number] and [id number])  (2) addition all right axillary  lymph node sampling 05/07/2004 showed 2 benign lymph nodes (4 lymph nodes previously removed, so total was one positive  lymph node out of 6; [id number])  (3) the patient was evaluated by radiation oncology; no postmastectomy radiation recommended  (4) adjuvant chemotherapy with dose dense doxorubicin and cyclophosphamide x4 cycles (first cycle delayed one week) followed by dose dense paclitaxel x4 was completed 09/18/2004  (5) tamoxifen started March 2006, discontinued 2009  METASTATIC DISEASE: (6) presenting with a large pericardial effusion, large right pleural effusion and possible right middle lobe bronchial obstruction February 2013, status post pericardial window placement, fiberoptic bronchoscopy and right Pleurx placement 09/11/2011, with biopsy of the bronchus intermedius and pericardium positive for metastatic breast cancer, estrogen receptor 91% positive with moderate staining intensity, progesterone receptor 100% positive with strong staining intensity, with an MIB-1 of 35%, and no HER-2 amplification, the signals ratio being 1.37 ([id number])  (7) enrolled in BOLERO-4 trial 10/10/2011, receiving letrozole and everolimus  (a) two small areas of enhancement in the cerebellum noted by brain MRI 10/03/2011 were no longer apparent on repeat MRI 08/21/2012-- most recent brain MRI 04/01/2014 showed no evidence of intracranial metastatic disease  (b) sclerotic lesions in left iliac bone and sacrum have not been biopsied; stable; plan is to start zolendronate after patient updates her dental care (extraction planned)  (c) RLL lung nodule, stable (rescanned 07/12/2014 and 08/11/2014) R hilar and subcarinal lymph nodes: stable  (9) additional problems:  (a) hepatic steatosis  (b) COPD/ emphysema/ asthma  (c) advanced L hip osteoarthritis  (d) aortoiliac atherosclerosis  (e) dental evaluation pending w possible dental extractions  (f) possible thalassemia trait  (10) Bone density concerns: DEXA scan 08/11/2013 was normal   PLAN: Alaya continues to tolerate treatment well. The CBC and CMET were reviewed and  showed stable results. Her alk phos and AST continue to be elevated but are mildly decreased since last month. We await the results of her lipid panel as her triglycerides has been trending upwards. If it elevated more so today, I will consult Dr. Jana Hakim to see if he recommends any intervention. She is currently on 685m lopid daily. And the patient is taking this drug as directed before meals.   LTesneemwill return in 4 weeks for labs and a follow up visit. Prior to this visit she will have repeat scans. She understands and agrees with this plan. She knows the goal of treatment in her case is control. She has been encouraged to call with any issues that might arise before her next visit here.    HLaurie Panda NP   03/22/2015 9:56 AM

## 2015-03-22 NOTE — Progress Notes (Signed)
03/22/15 at 10:44am- [employer]/ Bolero 4- cycle 46, day 1 study notes- The pt was into the cancer center this am for her fasting labs and cycle 46, day 1assessments. The pt confirmed that she was fasting upon arrival to the cancer center for her labs. The pt was seen and examined today by Dr. Virgie Dad NP, Gentry Fitz. She reviewed the pt's labs today, and she felt that her abnormal lab values were "not clinically significant". The pt's vitals and weight were stable, and her mean BP was obtained for study purposes.The pt reports the following AE's as ongoing: hot flashes, memory impairment, intermittent nausea, fatigue, back pain, and bilateral knee pain. The pt said that she had a "GI virus" last week that began on 03/15/15 and is now resolved.  She reports the following concomitant medications as ongoing: Lopressor, compazine prn, immodium prn, lopid, ibuprofen prn, vitamin D, and prilosec. She is performing all of her usual activities with some limitations. ECOG=1. The pt said that she overall feels good today, and she is looking forward to traveling to her niece's wedding this weekend.  Her planned hip replacement surgery is scheduled for 06/07/15. The pt has had osteoarthritis involving the left hip since screening. The pt's planned surgery is not considered a SAE per the study team. The pt said that her next dental work is scheduled for October.  The pt's lipid panel is pending. The pt returned her cycle 45 everolimus study drug boxes. She confirmed that she took her everolimus (10 mg) every day along with letrozole (2.5 mg) from 02/22/15 - 03/21/15 (28 days). The pt's everolimus drug boxes had 0 remaining (unopened) blister packs. The pt was dispensed 56 pills - 56 (28 days x 2 pills= 56). Therefore, the pt is 100% compliant with her study drug, everolimus, for cycle 45. The pt's everolimus was taken to the pharmacy for the drug accountability check and storage. The pt was dispensed her cycle 46  everolimus study drug boxes for self administration. The pt is aware of her September appointments.The pt is having her mammogram today.  Brion Aliment RN, BSN Clinical Research Nurse 03/22/2015 10:52 AM   03/22/15 at 2:07pm- The pt's lipid results are back, and the results are stable.  The pt's triglycerides have decreased down to 200 from 309.   Brion Aliment RN, BSN, CCRP Clinical Research Nurse 03/22/2015 2:08 PM

## 2015-03-23 ENCOUNTER — Ambulatory Visit: Payer: Medicare Other | Admitting: Nurse Practitioner

## 2015-03-23 ENCOUNTER — Other Ambulatory Visit: Payer: Medicare Other

## 2015-04-10 ENCOUNTER — Other Ambulatory Visit: Payer: Self-pay | Admitting: Oncology

## 2015-04-10 ENCOUNTER — Other Ambulatory Visit: Payer: Self-pay | Admitting: *Deleted

## 2015-04-10 ENCOUNTER — Encounter: Payer: Self-pay | Admitting: Oncology

## 2015-04-10 DIAGNOSIS — C50911 Malignant neoplasm of unspecified site of right female breast: Secondary | ICD-10-CM

## 2015-04-10 DIAGNOSIS — C7800 Secondary malignant neoplasm of unspecified lung: Principal | ICD-10-CM

## 2015-04-19 ENCOUNTER — Encounter (HOSPITAL_COMMUNITY): Payer: Self-pay

## 2015-04-19 ENCOUNTER — Encounter: Payer: Self-pay | Admitting: *Deleted

## 2015-04-19 ENCOUNTER — Ambulatory Visit (HOSPITAL_COMMUNITY)
Admission: RE | Admit: 2015-04-19 | Discharge: 2015-04-19 | Disposition: A | Discharge status: Home / Self Care | Payer: Medicare Other | Source: Ambulatory Visit | Attending: Oncology | Admitting: Oncology

## 2015-04-19 DIAGNOSIS — M1612 Unilateral primary osteoarthritis, left hip: Secondary | ICD-10-CM | POA: Diagnosis not present

## 2015-04-19 DIAGNOSIS — C7800 Secondary malignant neoplasm of unspecified lung: Principal | ICD-10-CM

## 2015-04-19 DIAGNOSIS — R918 Other nonspecific abnormal finding of lung field: Secondary | ICD-10-CM | POA: Insufficient documentation

## 2015-04-19 DIAGNOSIS — J90 Pleural effusion, not elsewhere classified: Secondary | ICD-10-CM | POA: Insufficient documentation

## 2015-04-19 DIAGNOSIS — C7951 Secondary malignant neoplasm of bone: Secondary | ICD-10-CM | POA: Insufficient documentation

## 2015-04-19 DIAGNOSIS — K760 Fatty (change of) liver, not elsewhere classified: Secondary | ICD-10-CM | POA: Diagnosis not present

## 2015-04-19 DIAGNOSIS — C50911 Malignant neoplasm of unspecified site of right female breast: Secondary | ICD-10-CM

## 2015-04-19 IMAGING — CT CT ABD-PELV W/ CM
2 of 5 series · 16 of 46 positions shown, 18 images · IV contrast (OMNIPAQUE)
Comparison: none

[Series 2: cap with st · axial · 0.71mm/px · z∈[-597,-62]mm · 13 of 123 slices shown, 15 images]
[im 8/123  soft-tissue]
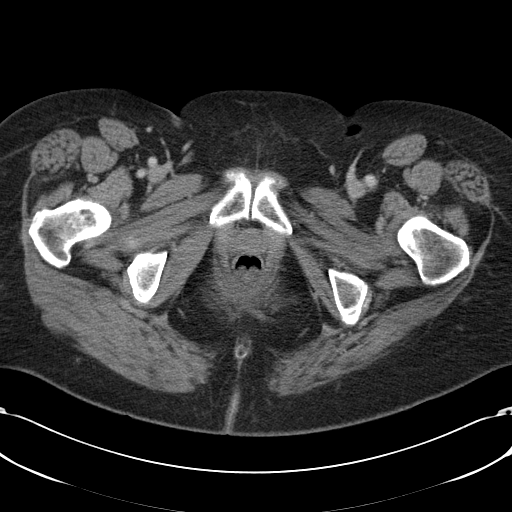
[im 8/123  bone]
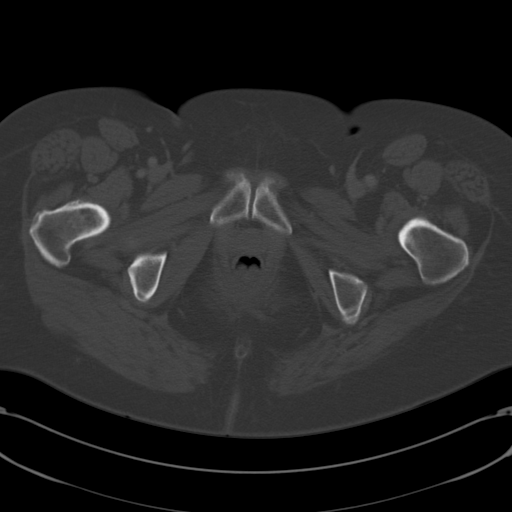
[im 15/123  soft-tissue]
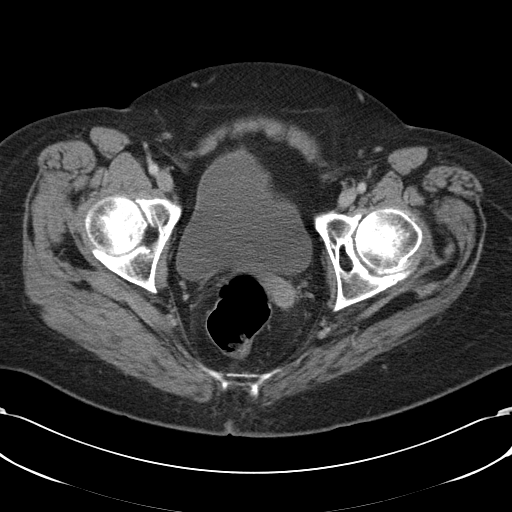
[im 29/123  soft-tissue]
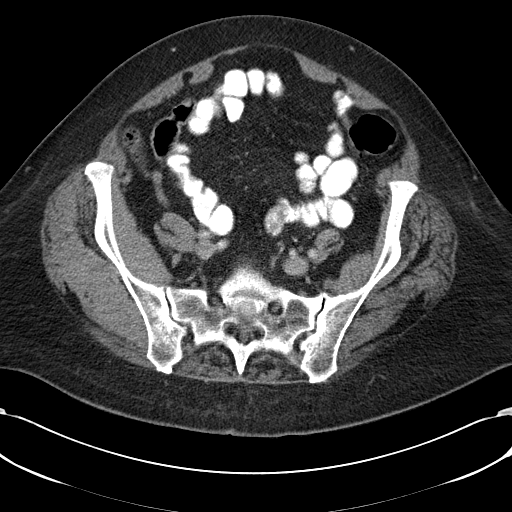
[im 36/123  soft-tissue]
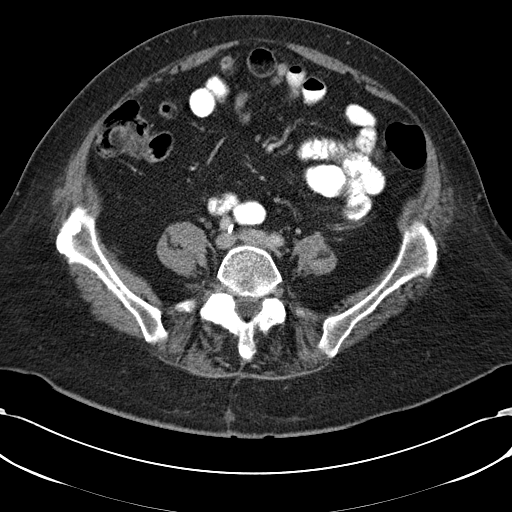
[im 44/123  soft-tissue]
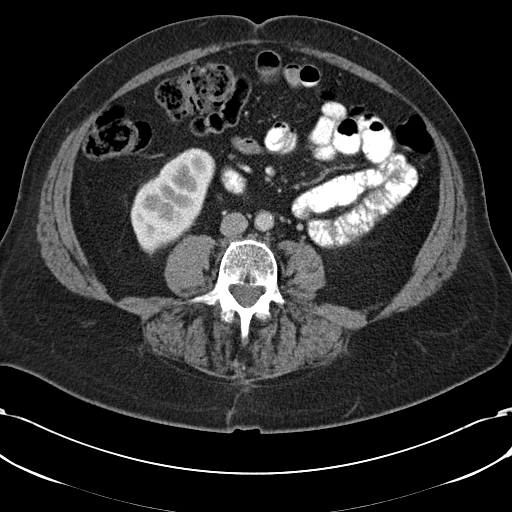
[im 51/123  soft-tissue]
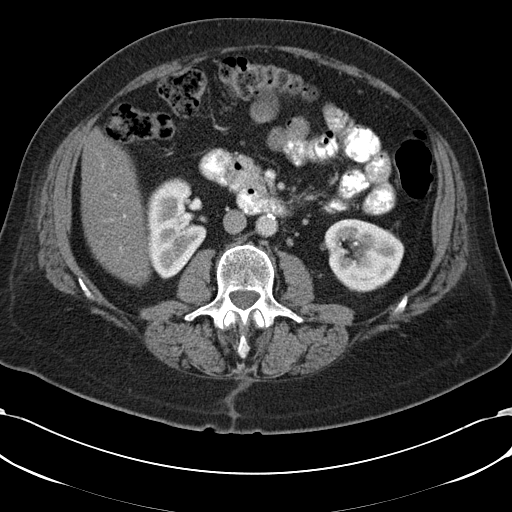
[im 65/123  soft-tissue]
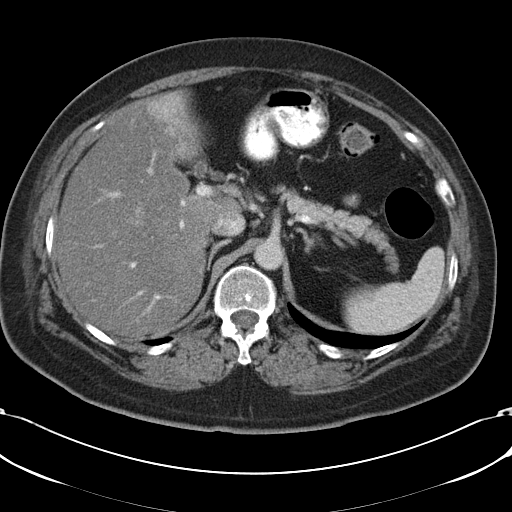
[im 72/123  soft-tissue]
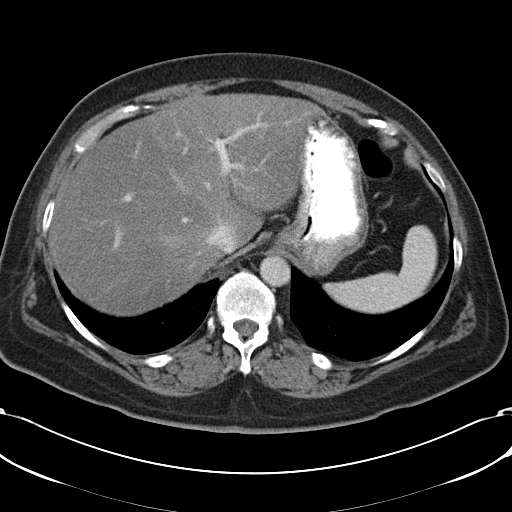
[im 79/123  soft-tissue]
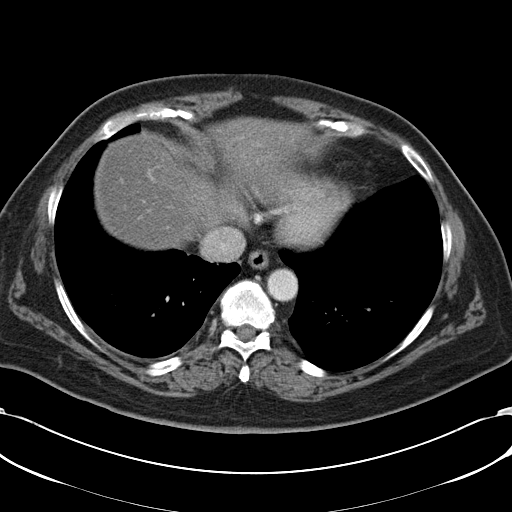
[im 79/123  bone]
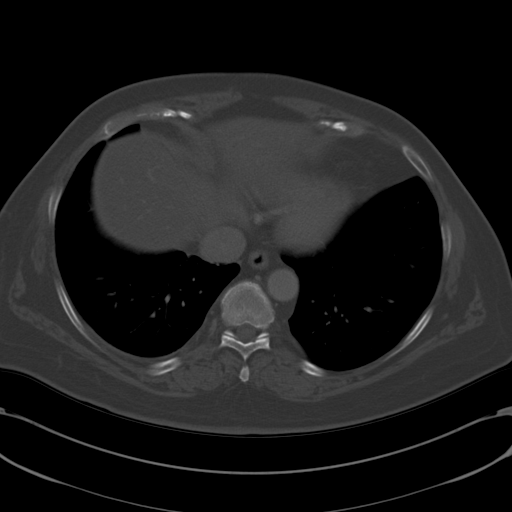
[im 87/123  soft-tissue]
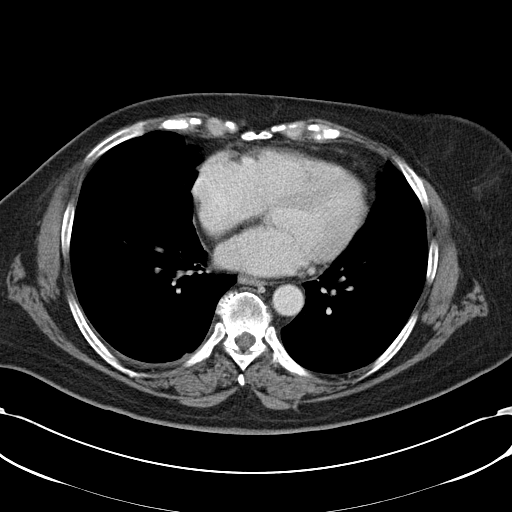
[im 94/123  soft-tissue]
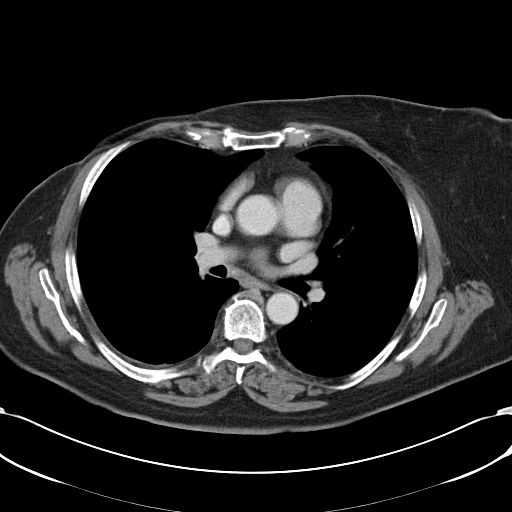
[im 108/123  soft-tissue]
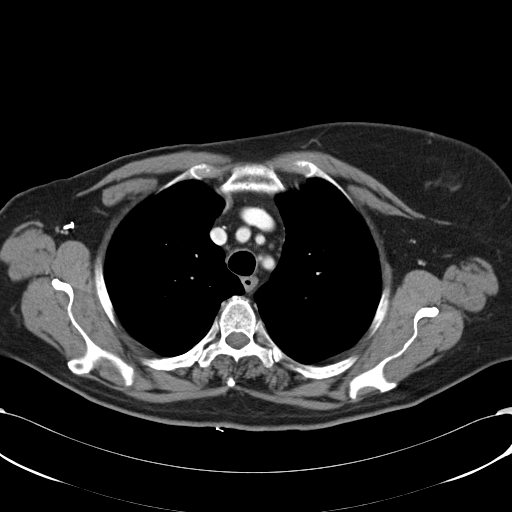
[im 115/123  soft-tissue]
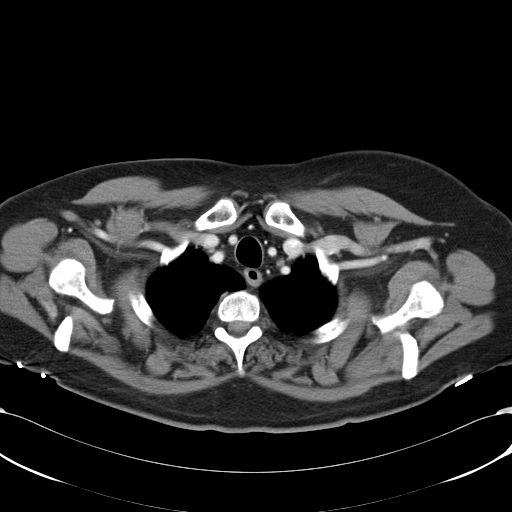

[Series 602: <mpr thick range> · coronal · 1.20mm/px · 3 of 103 slices shown]
[im 35/103  soft-tissue]
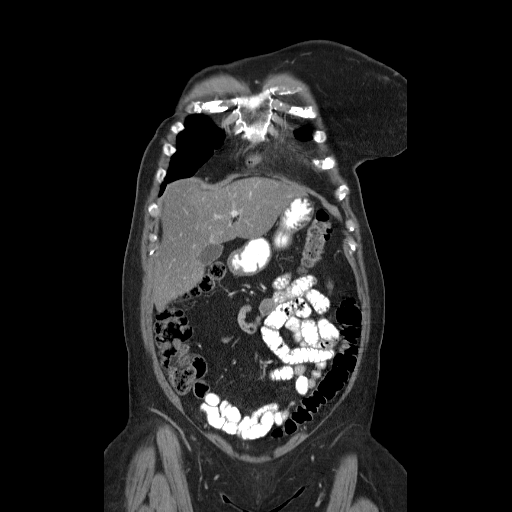
[im 46/103  soft-tissue]
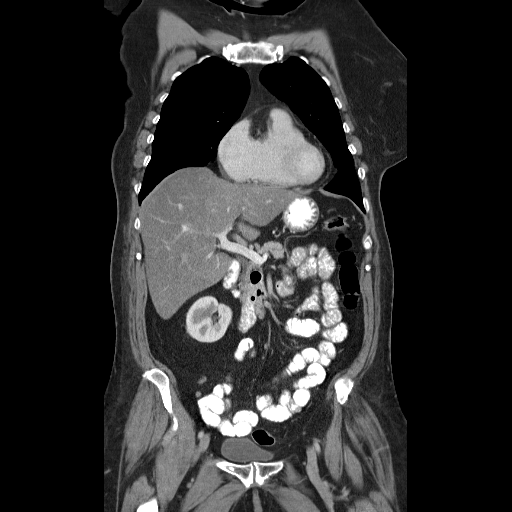
[im 57/103  soft-tissue]
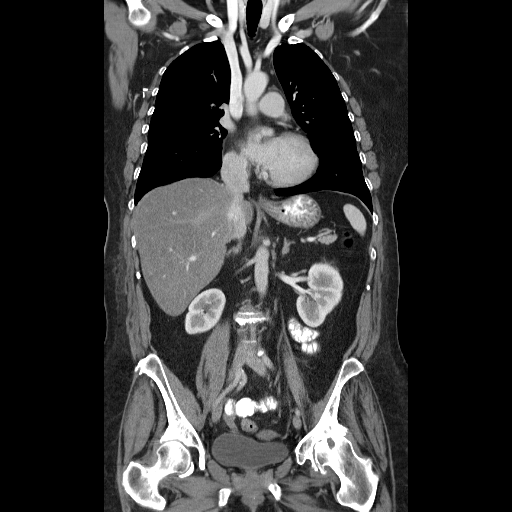

[16 of 46 positions shown; findings below may reference images not displayed]

CLINICAL DATA
Right breast cancer with brain, lung, and rib metastases. Oral
chemotherapy ongoing.

EXAM
CT CHEST, ABDOMEN, AND PELVIS WITH CONTRAST

TECHNIQUE
Multidetector CT imaging of the chest, abdomen and pelvis was
performed following the standard protocol during bolus
administration of intravenous contrast.

CONTRAST
100mL OMNIPAQUE IOHEXOL 300 MG/ML  SOLN

COMPARISON
08/11/2013

RECIST

Target Lesions:

1. Right lower lobe lung nodule measures 6 mm (series 5/image 40),
unchanged
2. Subcarinal lymph node measures 7 mm short axis (series 2/image
20), previously 9 mm
3. Right hilar lymph node measures 7 mm short axis (series 2/image
28), unchanged
Non-target Lesions:

1. Small right pleural effusion has resolved
2. Superior segment right lower lobe lung nodule is not visualized

FINDINGS
CT CHEST FINDINGS

Faint subpleural nodularity measuring 2-3 mm in the lateral right
upper lobe (series 5/ image 11), likely post
infectious/inflammatory. Right lower lobe scarring with associated 6
mm subpleural nodule (series 5/ image 40), unchanged.

No new/suspicious pulmonary nodules. No pleural effusion or
pneumothorax.

Visualized thyroid is unremarkable.

Mild cardiomegaly.  No pericardial effusion.

6 mm short axis right supraclavicular node (series 2/image 5),
unchanged. No suspicious mediastinal, hilar, or axillary
lymphadenopathy.

Status post right mastectomy with right axillary lymph node
dissection.

Mild degenerative changes of the thoracic spine.

CT ABDOMEN AND PELVIS FINDINGS

Liver is notable for hepatic steatosis. Linear hypervascular lesion
along the posterior right hepatic dome (series 2/image 54),
unchanged, possibly reflecting a perfusion anomaly or focal fatty
sparing. Additional geographic hyperenhancement in the medial
segment left hepatic lobe, adjacent to the gallbladder fossa (series
2/image 59), also favored to reflect altered perfusion and/or focal
fatty sparing.

Spleen, pancreas, and adrenal glands are within normal limits.

Stable nodularity at the gallbladder fundus (series 2/image 66),
favored to reflect gallbladder adenomyomatosis.

Kidneys are within normal limits.  No hydronephrosis.

No evidence of bowel obstruction.  Normal appendix.

Atherosclerotic calcifications of the abdominal aorta and branch
vessels.

No abdominopelvic ascites.

No suspicious abdominopelvic lymphadenopathy.

Uterus and bilateral ovaries are unremarkable.

Bladder is within normal limits.

Scattered faint sclerotic lesions in the pelvis, most prominently in
the left iliac wing (series 2/ image 94) and left sacrum (series 2/
image 97), unchanged. Degenerative changes of the left hip and lower
lumbar spine.

IMPRESSION
Status post right mastectomy and right axillary lymph node
dissection.

Stable sclerotic osseous metastases in the pelvis, as described
above.

Otherwise, no evidence of metastatic disease in the chest, abdomen,
and pelvis.

Stable 6 mm short axis right supraclavicular node. Attention on
follow-up suggested.

RECIST 1.0 measurements as above.

SIGNATURE

## 2015-04-19 MED ORDER — IOHEXOL 300 MG/ML  SOLN
100.0000 mL | Freq: Once | INTRAMUSCULAR | Status: AC | PRN
  Administered 2015-04-19: 100 mL via INTRAVENOUS

## 2015-04-19 MED ORDER — IOHEXOL 300 MG/ML  SOLN
50.0000 mL | Freq: Once | INTRAMUSCULAR | Status: AC | PRN
  Administered 2015-04-19: 50 mL via ORAL

## 2015-04-19 MED ORDER — INV-EVEROLIMUS (RAD0001) 5MG TABLET NOVARTIS CRAD001Y24135: 2.0000 | ORAL_TABLET | Freq: Every day | ORAL | Status: DC

## 2015-04-19 NOTE — Progress Notes (Signed)
04/19/15 at 2:07pm - [employer]/[facility] 4 - cycle 47, day 0 study notes.- The pt was in radiology this am for her restaging scans.  The research nurse met with the pt briefly in the radiology lobby to obtain her cycle 46 everolimus drug box kits.  The pt confirmed that she took her everolimus pills (10 mg) everyday along with her letrozole pill (2.5 mg) from 03/22/15 through 04/18/15 (28 days).  The pt returned 0 pills.  She was 100% compliant with her study drug for cycle 46.  The pt was dispensed her new everolimus drug boxes (3) for self administration.  The pt was instructed to continue taking them as prescribed.  The pt's cycle 47 will be 29 days. Therefore, the study team advised the site to dispense 3 boxes so that she does not run out of study drug before her next visit.  The pt was informed why she is receiving 3 boxes today.  The pt's cycle 75 labs and MD appointment is scheduled for tomorrow.   The pt was thanked for her compliance.  The pt states she is still tolerating her study drug well with no new adverse events to report today.  The pt and nurse discussed the pt's upcoming surgery on 06/07/15 and her scheduled cycle 49 visit which is duefon 06/15/15 (a week following her surgery).   Brion Aliment RN, BSN, CCRP Clinical Research Nurse 04/19/2015 2:16 PM   04/20/15 at 10:47am - [employer] Bolero 4 - cycle 47, day 1 study notes- The pt was into the cancer center this am for her fasting labs and cycle 47, day 1assessments. The pt confirmed that she was fasting upon arrival to the cancer center for her labs. The pt was seen and examined today by Dr. Virgie Dad NP, Gentry Fitz. She reviewed the pt's labs, and she felt that her abnormal lab values were "not clinically significant". The pt's vitals and weight were stable, and her mean BP was obtained for study purposes. Dr. Jana Hakim and his NP reviewed the pt's CT scans from 04/19/15. He confirmed that her target and non-target disease is stable.  He  acknowledged the "new 3 mm indeterminate right upper lobe pulmonary nodule'.  He feels that this is not definitive evidence of progression.   He said that overall he is very pleased with her status, and he wishes to continue her study treatment without any modifications. He wants to monitor her new lesion in 8 weeks.  The research nurse has forwarded this information to the study team for review.  She reports the following AE's as ongoing: hot flashes, memory impairment, intermittent nausea/diarrhea, fatigue, back pain, and bilateral knee pain. She denies any new medications.  She reports the following concomitant medications as ongoing: Lopressor, compazine prn, immodium prn, lopid, ibuprofen prn, vitamin D, and prilosec. She is performing all of her usual activities with some limitations. ECOG=1.  The pt's lipid panel is pending. Brion Aliment RN, BSN, CCRP Clinical Research Nurse 04/20/2015 4:24 PM   04/20/15 at 4:25pm - The pt's lipid panel is stable.  Awaiting confirmation from study team about pt's status on the trial with regards to her latest scans.    Brion Aliment RN, BSN, CCRP Clinical Research Nurse 04/20/2015 4:26 PM   04/21/15 at 1:20pm- The clinical team from [employer] responded and was in agreement with Dr. Jana Hakim to allow the pt to continue on her current treatment.  It was suggested that this "new nodule" be watched closely for evidence of  progression.  Dr. Jana Hakim was forwarded the email from Evon Slack, clinical research scientist from [employer], stating the pt could remain on the study if the investigator does not believe this is evidence of progression.  The pt's next scan is due in mid-November.   Brion Aliment RN, BSN, CCRP Clinical Research Nurse 04/21/2015 1:23 PM

## 2015-04-20 ENCOUNTER — Other Ambulatory Visit (HOSPITAL_BASED_OUTPATIENT_CLINIC_OR_DEPARTMENT_OTHER): Payer: Medicare Other

## 2015-04-20 ENCOUNTER — Encounter: Payer: Self-pay | Admitting: Nurse Practitioner

## 2015-04-20 ENCOUNTER — Telehealth: Payer: Self-pay | Admitting: Nurse Practitioner

## 2015-04-20 ENCOUNTER — Other Ambulatory Visit (HOSPITAL_COMMUNITY)
Admission: RE | Admit: 2015-04-20 | Discharge: 2015-04-20 | Disposition: A | Discharge status: Home / Self Care | Payer: Medicare Other | Source: Ambulatory Visit | Attending: Oncology | Admitting: Oncology

## 2015-04-20 ENCOUNTER — Ambulatory Visit (HOSPITAL_BASED_OUTPATIENT_CLINIC_OR_DEPARTMENT_OTHER): Payer: Medicare Other | Admitting: Nurse Practitioner

## 2015-04-20 VITALS — BP 122/63 | HR 86 | Temp 98.0°F | Resp 18 | Ht 65.0 in | Wt 169.3 lb

## 2015-04-20 DIAGNOSIS — Z006 Encounter for examination for normal comparison and control in clinical research program: Secondary | ICD-10-CM

## 2015-04-20 DIAGNOSIS — C50911 Malignant neoplasm of unspecified site of right female breast: Secondary | ICD-10-CM | POA: Diagnosis not present

## 2015-04-20 DIAGNOSIS — M8580 Other specified disorders of bone density and structure, unspecified site: Secondary | ICD-10-CM

## 2015-04-20 DIAGNOSIS — C7800 Secondary malignant neoplasm of unspecified lung: Principal | ICD-10-CM

## 2015-04-20 DIAGNOSIS — C7801 Secondary malignant neoplasm of right lung: Secondary | ICD-10-CM | POA: Diagnosis not present

## 2015-04-20 DIAGNOSIS — E2839 Other primary ovarian failure: Secondary | ICD-10-CM | POA: Diagnosis not present

## 2015-04-20 DIAGNOSIS — C50919 Malignant neoplasm of unspecified site of unspecified female breast: Secondary | ICD-10-CM | POA: Diagnosis not present

## 2015-04-20 LAB — COMPREHENSIVE METABOLIC PANEL (CC13)
ALBUMIN: 3.5 g/dL (ref 3.5–5.0)
ALK PHOS: 276 U/L — AB (ref 40–150)
ALT: 36 U/L (ref 0–55)
AST: 39 U/L — ABNORMAL HIGH (ref 5–34)
Anion Gap: 10 mEq/L (ref 3–11)
BILIRUBIN TOTAL: 0.31 mg/dL (ref 0.20–1.20)
BUN: 14.3 mg/dL (ref 7.0–26.0)
CALCIUM: 10.1 mg/dL (ref 8.4–10.4)
CO2: 23 mEq/L (ref 22–29)
CREATININE: 1 mg/dL (ref 0.6–1.1)
Chloride: 108 mEq/L (ref 98–109)
EGFR: 61 mL/min/{1.73_m2} — ABNORMAL LOW (ref >90)
GLUCOSE: 134 mg/dL (ref 70–140)
POTASSIUM: 4 meq/L (ref 3.5–5.1)
SODIUM: 141 meq/L (ref 136–145)
Total Protein: 8.2 g/dL (ref 6.4–8.3)

## 2015-04-20 LAB — CBC WITH DIFFERENTIAL/PLATELET
BASO%: 0.3 % (ref 0.0–2.0)
BASOS ABS: 0 10*3/uL (ref 0.0–0.1)
EOS ABS: 0.2 10*3/uL (ref 0.0–0.5)
EOS%: 2.1 % (ref 0.0–7.0)
HCT: 41.7 % (ref 34.8–46.6)
HEMOGLOBIN: 13.9 g/dL (ref 11.6–15.9)
LYMPH#: 1 10*3/uL (ref 0.9–3.3)
LYMPH%: 11.8 % — ABNORMAL LOW (ref 14.0–49.7)
MCH: 26.2 pg (ref 25.1–34.0)
MCHC: 33.4 g/dL (ref 31.5–36.0)
MCV: 78.5 fL — ABNORMAL LOW (ref 79.5–101.0)
MONO#: 0.8 10*3/uL (ref 0.1–0.9)
MONO%: 9.7 % (ref 0.0–14.0)
NEUT#: 6.3 10*3/uL (ref 1.5–6.5)
NEUT%: 76.1 % (ref 38.4–76.8)
Platelets: 325 10*3/uL (ref 145–400)
RBC: 5.31 10*6/uL (ref 3.70–5.45)
RDW: 13.9 % (ref 11.2–14.5)
WBC: 8.3 10*3/uL (ref 3.9–10.3)

## 2015-04-20 LAB — PHOSPHORUS: PHOSPHORUS: 3 mg/dL (ref 2.5–4.6)

## 2015-04-20 LAB — BILIRUBIN, DIRECT: Bilirubin, Direct: <0.1 mg/dL — ABNORMAL LOW (ref 0.1–0.5)

## 2015-04-20 LAB — LIPID PANEL
CHOL/HDL RATIO: 4.8 ratio (ref <=5.0)
CHOLESTEROL: 227 mg/dL — AB (ref 125–200)
HDL: 47 mg/dL (ref >=46)
LDL Cholesterol: 133 mg/dL — ABNORMAL HIGH (ref <130)
Triglycerides: 237 mg/dL — ABNORMAL HIGH (ref <150)
VLDL: 47 mg/dL — ABNORMAL HIGH (ref <30)

## 2015-04-20 LAB — URIC ACID (CC13): Uric Acid, Serum: 3.1 mg/dL (ref 2.6–7.4)

## 2015-04-20 LAB — CK: Total CK: 58 U/L (ref 38–234)

## 2015-04-20 NOTE — Progress Notes (Signed)
[place]  [phone] [phone]     ID: Barbara Parks DOB: 06-03-1951  MR#: [id number]  MLJ#:[id number]  Patient Care Team: Darcus Austin, MD as PCP - General (Family Medicine) Jerline Pain, MD (Cardiology) Gaye Pollack, MD (Cardiothoracic Surgery) Chauncey Cruel, MD as Consulting Physician (Oncology) OTHER MD: Dr Erroll Luna- Merlene Laughter, DDS; Gaynelle Arabian MD  CHIEF COMPLAINT: metastatic breast cancer, on BOLERO study  CURRENT TREATMENT: letrozole + everolimus  BREAST CANCER HISTORY: From Dr. Collier [name]'s original intake note 04/13/2004:  "This woman has been in good health all of her life. She recently moved from [place] to work here.  She palpated a mass at about the 12 o'clock position in mid-July. She has not noticed any nipple retraction or skin changes.  She was seen by her primary care doctor who subsequently referred her for a mammogram.  Mammogram was performed on 03/01/04. This demonstrated a spiculated 2 cm mass at the 12 o'clock position in the right breast.  Upper outer quadrant of the left breast shows some distortion.   Physical exam at that time showed a firm, nontender nodule at the 12 o'clock position in the right breast, 5 cm from the nipple.  Ultrasound of this area showed a hypoechoic ill-defined mass, measuring 2.2 x 1.3 x 1.4 cm.  Physical exam of the left breast showed general vague thickening, upper outer quadrant of the left breast with a discrete palpable mass. The ultrasound performed showed a single hypoechoic ill-defined nodule at the 1 o'clock position, measuring 7 x 5 x 6 mm.  She had biopsies of both lesions on 03/02/03.  Needle core biopsy of the lesion on the right breast revealed invasive mammary carcinoma.  Needle core biopsy of the left breast showed a complex fibroadenoma.  Prognostic panel of the lesion on the right breast showed it to be ER positive at 73%, PR positive at 90% and proliferative index 9%, HER-2  was 1+.  Patient was referred to Dr. Annamaria Boots, who performed a simple mastectomy with sentinel lymph node evaluation on 03/22/04.  Final pathology showed this to be an invasive ductal carcinoma  with lobular features, measuring 2.2 cm, grade 2 of 3.  Margins negative for carcinoma.  Invasive ductal carcinoma was extended to involve deep dermis of the nipple.  Lymphovascular invasion was identified.  Total of 4 sentinel lymph nodes were evaluated.  Touch imprints at the time of the OR was felt to be negative.  Subsequent evaluation showed a 5 mm focus of metastatic carcinoma in one of the four lymph nodes on microscopic after sectioning.  There was extracapsular extension  of one lymph node as well. The remaining three lymph nodes were all negative."  The patient's subsequent history is detailed below.  INTERVAL HISTORY: Barbara Parks returns today for follow up of her metastatic breast cancer , accompanied by her research nurse [name]. Barbara Parks continues to participate in the [place] trial, currently on cycle 47. She continues on everolimus and letrozole. She tolerates both drugs well with few complaints. She has mild hot flashes at night, but no vaginal changes. She also denies mucositis or pneumonitis symptoms.  REVIEW OF SYSTEMS: Barbara Parks denies fevers or chills. She is noticing as time goes on that some foods tend not to "agree" with the everolimus she believes. She had to use 2 doses of compazine during our last visit, and had 1 episode of diarrhea due to something she ate. Otherwise her appetite is good and she is  maintaining her weight +/- 3lb. She has shortness of breath with exertion, but denies chest pain, cough, or palpitations. She has no headaches, dizziness, or vision changes, but does have some mild hearing loss. She has continued chronic lower back and left hip pain, but takes very little pain meds for this. She anticipates a hip replacement by Dr. Maureen Ralphs in early November. A detailed review of systems  is otherwise stable.  PAST MEDICAL HISTORY: Past Medical History  Diagnosis Date  . Asthma   . Shortness of breath   . Arthritis     left hip  . Contact dermatitis     from a bandaid that had Latex   . Osteopenia due to cancer therapy 09/09/2013  . Hypercholesteremia   . ADHD (attention deficit hyperactivity disorder)   . breast ca 2005    breast/chemo R mastectomy  . Metastasis to lung dx'd 08/2011    PAST SURGICAL HISTORY: Past Surgical History  Procedure Laterality Date  . Spine surgery  1996  . Breast surgery  2005    right  . Video bronchoscopy  09/11/2011    Procedure: VIDEO BRONCHOSCOPY;  Surgeon: Gaye Pollack, MD;  Location: [facility];  Service: Thoracic;  Laterality: N/A;  . Chest tube insertion  09/11/2011    Procedure: INSERTION PLEURAL DRAINAGE CATHETER;  Surgeon: Gaye Pollack, MD;  Location: [facility];  Service: Thoracic;  Laterality: Right;  . Pericardial window  09/11/2011    Procedure: PERICARDIAL WINDOW;  Surgeon: Gaye Pollack, MD;  Location: [facility];  Service: Thoracic;  Laterality: N/A;  . Removal of pleural drainage catheter  12/19/2011    Procedure: REMOVAL OF PLEURAL DRAINAGE CATHETER;  Surgeon: Gaye Pollack, MD;  Location: [facility];  Service: Thoracic;  Laterality: Right;  TO BE DONE IN MINOR ROOM, SHORT STAY  . Cataract extraction w/phaco Left 01/18/2014    Procedure: CATARACT EXTRACTION PHACO AND INTRAOCULAR LENS PLACEMENT (IOC);  Surgeon: Elta Guadeloupe T. Gershon Crane, MD;  Location: [facility];  Service: Ophthalmology;  Laterality: Left;  CDE 15.79  . Cataract extraction w/phaco Right 02/08/2014    Procedure: CATARACT EXTRACTION PHACO AND INTRAOCULAR LENS PLACEMENT (IOC);  Surgeon: Elta Guadeloupe T. Gershon Crane, MD;  Location: [facility];  Service: Ophthalmology;  Laterality: Right;  CDE 4.41    FAMILY HISTORY Family History  Problem Relation Age of Onset  . Cancer Mother     breast  . Heart disease Father   . Anesthesia problems Neg Hx    the patient's mother was diagnosed with breast cancer  at age 48, she died age 67 from congestive heart failure..The patient's father died from heart disease at age 46.  She has one sister alive & well.  Two brothers alive & well, one with diabetes. The patient's sister was also diagnosed with breast cancer, and was tested for the BRCA gene, and was negative. The patient herself has not been tested. There is no history of ovarian cancer in the family  GYNECOLOGIC HISTORY:  No LMP recorded. Patient is postmenopausal. Menarche age 13, the patient is GX P0. She stopped having periods with chemotherapy in 2005. 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 She was a Archivist. She is single, lives alone with her Shitzu-poodle Sammie.. Family is all in the [place] area.     ADVANCED DIRECTIVES: Not in place. At the 02/23/2014 visit the patient was given  the appropriate documents to complete and notarize at her discretion. She tells me she is planning to name her sister, Billie Ruddy, as healthcare power of attorney. [name] can be reached at [phone]   HEALTH MAINTENANCE: Social History  Substance Use Topics  . Smoking status: Former Smoker -- 1.50 packs/day for 30 years    Types: Cigarettes    Quit date: 09/10/2007  . Smokeless tobacco: Not on file  . Alcohol Use: No     Colonoscopy:  PAP:  Bone density: 08/11/2013, showed osteopenia  Lipid panel:  Allergies  Allergen Reactions  . Aspirin     REACTION: upset stomach  . Latex Other (See Comments)    Blistering and skin peels off  . Nsaids Nausea And Vomiting    Extreme nausea and vomiting    Current Outpatient Prescriptions  Medication Sig Dispense Refill  . gemfibrozil (LOPID) 600 MG tablet TAKE ONE TABLET BY MOUTH TWICE DAILY BEFORE A MEAL 60 tablet 0  . Investigational everolimus (RAD001) 5 MG tablet Novartis [id number] Take 2 tablets by mouth daily. Take with  a glass of water. 70 tablet 0  . letrozole (FEMARA) 2.5 MG tablet TAKE ONE TABLET BY MOUTH ONCE DAILY 90 tablet 0  . loperamide (IMODIUM) 2 MG capsule Take 2 mg by mouth 4 (four) times daily as needed for diarrhea or loose stools.    . metoprolol tartrate (LOPRESSOR) 25 MG tablet Take 0.5 tablets (12.5 mg total) by mouth daily. 30 tablet 4  . omeprazole (PRILOSEC) 40 MG capsule Take 1 capsule (40 mg total) by mouth daily as needed. 90 capsule 3  . prochlorperazine (COMPAZINE) 10 MG tablet Take 1 tablet (10 mg total) by mouth every 6 (six) hours as needed (Nausea or vomiting). 30 tablet 1  . cholecalciferol (VITAMIN D) 1000 UNITS tablet Take 2,000 Units by mouth daily.      No current facility-administered medications for this visit.    OBJECTIVE: Middle-aged white woman who appears stated age 64 Vitals:   04/20/15 0855  BP: 122/63  Pulse:   Temp:   Resp:      Body mass index is 28.17 kg/(m^2).    ECOG FS:1 - Symptomatic but completely ambulatory  Sclerae unicteric, pupils round and equal Oropharynx clear and moist-- no thrush or other lesions No cervical or supraclavicular adenopathy Lungs no rales or rhonchi Heart regular rate and rhythm Abd soft, nontender, positive bowel sounds MSK no focal spinal tenderness, no upper extremity lymphedema, ambulates with cane Neuro: nonfocal, well oriented, appropriate affect Breasts: deferred  LAB RESULTS:  CMP     Component Value Date/Time   NA 141 04/20/2015 0830   NA 136 07/16/2012 0910   K 4.0 04/20/2015 0830   K 4.0 07/16/2012 0910   CL 109* 12/31/2012 0940   CL 103 07/16/2012 0910   CO2 23 04/20/2015 0830   CO2 21 07/16/2012 0910   GLUCOSE 134 04/20/2015 0830   GLUCOSE 127* 12/31/2012 0940   GLUCOSE 140* 07/16/2012 0910   BUN 14.3 04/20/2015 0830   BUN 16 07/16/2012 0910   CREATININE 1.0 04/20/2015 0830   CREATININE 0.75 07/16/2012 0910   CALCIUM 10.1 04/20/2015 0830   CALCIUM 10.1 07/16/2012 0910   PROT 8.2 04/20/2015  0830   PROT 8.0 07/16/2012 0910   ALBUMIN 3.5 04/20/2015 0830   ALBUMIN 3.7 07/16/2012 0910   AST 39* 04/20/2015 0830   AST 30 07/16/2012 0910   ALT 36 04/20/2015 0830   ALT 33 07/16/2012 0910  ALKPHOS 276* 04/20/2015 0830   ALKPHOS 142* 07/16/2012 0910   BILITOT 0.31 04/20/2015 0830   BILITOT 0.2* 07/16/2012 0910   GFRNONAA >90 09/13/2011 0440   GFRAA >90 09/13/2011 0440    I No results found for: SPEP  Lab Results  Component Value Date   WBC 8.3 04/20/2015   NEUTROABS 6.3 04/20/2015   HGB 13.9 04/20/2015   HCT 41.7 04/20/2015   MCV 78.5* 04/20/2015   PLT 325 04/20/2015      Chemistry      Component Value Date/Time   NA 141 04/20/2015 0830   NA 136 07/16/2012 0910   K 4.0 04/20/2015 0830   K 4.0 07/16/2012 0910   CL 109* 12/31/2012 0940   CL 103 07/16/2012 0910   CO2 23 04/20/2015 0830   CO2 21 07/16/2012 0910   BUN 14.3 04/20/2015 0830   BUN 16 07/16/2012 0910   CREATININE 1.0 04/20/2015 0830   CREATININE 0.75 07/16/2012 0910      Component Value Date/Time   CALCIUM 10.1 04/20/2015 0830   CALCIUM 10.1 07/16/2012 0910   ALKPHOS 276* 04/20/2015 0830   ALKPHOS 142* 07/16/2012 0910   AST 39* 04/20/2015 0830   AST 30 07/16/2012 0910   ALT 36 04/20/2015 0830   ALT 33 07/16/2012 0910   BILITOT 0.31 04/20/2015 0830   BILITOT 0.2* 07/16/2012 0910       Lab Results  Component Value Date   LABCA2 58* 09/14/2012    No components found for: [id number]  No results for input(s): INR in the last 168 hours.  Urinalysis    Component Value Date/Time   COLORURINE YELLOW 09/11/2011 0819   APPEARANCEUR CLEAR 09/11/2011 0819   LABSPEC 1.020 09/08/2014 0956   LABSPEC 1.013 09/11/2011 0819   PHURINE 6.0 09/08/2014 0956   PHURINE 6.5 09/11/2011 0819   GLUCOSEU Negative 09/08/2014 0956   GLUCOSEU NEGATIVE 09/11/2011 0819   HGBUR Large 09/08/2014 0956   HGBUR SMALL* 09/11/2011 0819   BILIRUBINUR Negative 09/08/2014 0956   BILIRUBINUR NEGATIVE 09/11/2011 0819    KETONESUR Negative 09/08/2014 0956   KETONESUR 15* 09/11/2011 0819   PROTEINUR 30 09/08/2014 0956   PROTEINUR NEGATIVE 09/11/2011 0819   UROBILINOGEN 0.2 09/08/2014 0956   UROBILINOGEN 0.2 09/11/2011 0819   NITRITE Negative 09/08/2014 0956   NITRITE NEGATIVE 09/11/2011 0819   LEUKOCYTESUR Negative 09/08/2014 0956   LEUKOCYTESUR NEGATIVE 09/11/2011 0819    STUDIES: Ct Chest W Contrast  04/19/2015   CLINICAL DATA:  64 year old female with right breast cancer diagnosed in 2005 metastatic to the lungs diagnosed in 2013 on investigational drug chemotherapy trial. Restaging.  EXAM: CT CHEST, ABDOMEN, AND PELVIS WITH CONTRAST  TECHNIQUE: Multidetector CT imaging of the chest, abdomen and pelvis was performed following the standard protocol during bolus administration of intravenous contrast.  CONTRAST:  57m OMNIPAQUE IOHEXOL 300 MG/ML SOLN, 1072mOMNIPAQUE IOHEXOL 300 MG/ML SOLN  COMPARISON:  02/21/2015 CT of the chest, abdomen and pelvis.  FINDINGS: RECIST 1.0  Target Lesions:  1. Right lower lobe lung nodule (series 5/image 38): 0.6 cm, previously 0.6 cm 2. Subcarinal lymph node: 0.6 cm, previously 0.6 cm with stable coarse internal calcifications 3. Right hilar lymph node:  0.8 cm, previously 0.7 cm Non-target Lesions:  1. Small right pleural effusion: New trace layering right pleural effusion, previously resolved 2. Superior segment right lower lobe lung nodule: Resolved (the superior segment right lower lobe lung nodule described on the baseline 10/03/2011 chest CT study is not apparent on the current  scan, as on the prior chest CT)  CT CHEST FINDINGS  Mediastinum/Nodes: Normal heart size. No pericardial fluid/thickening. Atherosclerotic nonaneurysmal thoracic aorta. No central pulmonary emboli. Subcentimeter hypodense nodule in the right thyroid lobe, likely unchanged, better visualized on today's scan. Normal esophagus. Right axillary surgical clips are again noted. No pathologically enlarged  axillary, mediastinal or hilar lymph nodes. Stable coarsely calcified 0.6 cm subcarinal node. Right hilar 0.8 cm top normal size node (2/27), previously 0.7 cm, not appreciably changed.  Lungs/Pleura: New trace layering right pleural effusion. No left pleural effusion. No pneumothorax. There is a new 3 mm right upper lobe solid pulmonary nodule (series 5/ image 21). There is a peripheral right upper lobe 2 mm pulmonary nodule (5/20), stable since 10/03/2011 and probably benign. There is a 0.6 cm peripheral right lower lobe pulmonary nodule (5/30), unchanged, with associated stable surrounding bandlike peripheral reticulation in the right lower lobe. No additional new significant pulmonary nodules or acute consolidative airspace disease. Subsegmental atelectasis in the basilar left lower lobe.  Musculoskeletal: Mild degenerative changes in the thoracic spine. No aggressive appearing focal osseous lesions. Status post right mastectomy.  CT ABDOMEN AND PELVIS FINDINGS  Hepatobiliary: Diffuse hepatic steatosis. Stable 1.3 cm hyperenhancing right liver lobe focus (series 2/ image 52), which is occult on the delayed sequence and stable since 10/03/2011, most in keeping with a benign transient perfusional phenomena. No new liver lesions. Stable focal fundal adenomyomatosis in the otherwise normal gallbladder, with no radiopaque cholelithiasis. No biliary ductal dilatation.  Pancreas: Normal, with no mass or duct dilation.  Spleen: Normal size spleen with no splenic mass.  Adrenals/Urinary Tract: Normal right adrenal. Left adrenal 1.0 cm nodule (2/61), unchanged since 10/03/2011, in keeping with a benign adenoma. Normal kidneys, with no hydronephrosis. Normal caliber ureters. Normal urinary bladder.  Stomach/Bowel: Collapsed and grossly normal stomach. Normal caliber small bowel, with no small bowel wall thickening. Normal appendix. Normal large bowel.  Vascular/Lymphatic: Atherosclerotic nonaneurysmal abdominal aorta.  Patent portal, splenic and renal veins. No abdominopelvic lymphadenopathy.  Reproductive: Normal anteverted uterus.  No adnexal mass.  Other: No pneumoperitoneum, ascites or focal fluid collection.  Musculoskeletal: Asymmetric prominent osteoarthritis in the left hip joint, unchanged. Moderate degenerative changes in the lumbar spine. Stable small sclerotic lesion in the left sacrum (2/94). Stable small sclerotic anterior left iliac bone focus (2/91), stable since 2005 and likely benign. No new aggressive appearing focal osseous lesions.  IMPRESSION: 1. New indeterminate 3 mm right upper lobe pulmonary nodule, metastasis not excluded. Two additional subcentimeter right pulmonary nodules are stable. Recommend attention on follow-up chest CT in 3 months. 2. New trace layering right pleural effusion. 3. Stable sclerotic left sacral osseous metastasis. 4. Otherwise no potential sites of metastatic disease in the chest, abdomen or pelvis. 5. Diffuse hepatic steatosis. 6. RECIST findings as above.   Electronically Signed   By: Ilona Sorrel M.D.   On: 04/19/2015 17:19   Ct Abdomen Pelvis W Contrast  04/19/2015   CLINICAL DATA:  64 year old female with right breast cancer diagnosed in 2005 metastatic to the lungs diagnosed in 2013 on investigational drug chemotherapy trial. Restaging.  EXAM: CT CHEST, ABDOMEN, AND PELVIS WITH CONTRAST  TECHNIQUE: Multidetector CT imaging of the chest, abdomen and pelvis was performed following the standard protocol during bolus administration of intravenous contrast.  CONTRAST:  60m OMNIPAQUE IOHEXOL 300 MG/ML SOLN, 1091mOMNIPAQUE IOHEXOL 300 MG/ML SOLN  COMPARISON:  02/21/2015 CT of the chest, abdomen and pelvis.  FINDINGS: RECIST 1.0  Target Lesions:  1. Right lower lobe lung nodule (series 5/image 38): 0.6 cm, previously 0.6 cm 2. Subcarinal lymph node: 0.6 cm, previously 0.6 cm with stable coarse internal calcifications 3. Right hilar lymph node:  0.8 cm, previously 0.7 cm  Non-target Lesions:  1. Small right pleural effusion: New trace layering right pleural effusion, previously resolved 2. Superior segment right lower lobe lung nodule: Resolved (the superior segment right lower lobe lung nodule described on the baseline 10/03/2011 chest CT study is not apparent on the current scan, as on the prior chest CT)  CT CHEST FINDINGS  Mediastinum/Nodes: Normal heart size. No pericardial fluid/thickening. Atherosclerotic nonaneurysmal thoracic aorta. No central pulmonary emboli. Subcentimeter hypodense nodule in the right thyroid lobe, likely unchanged, better visualized on today's scan. Normal esophagus. Right axillary surgical clips are again noted. No pathologically enlarged axillary, mediastinal or hilar lymph nodes. Stable coarsely calcified 0.6 cm subcarinal node. Right hilar 0.8 cm top normal size node (2/27), previously 0.7 cm, not appreciably changed.  Lungs/Pleura: New trace layering right pleural effusion. No left pleural effusion. No pneumothorax. There is a new 3 mm right upper lobe solid pulmonary nodule (series 5/ image 21). There is a peripheral right upper lobe 2 mm pulmonary nodule (5/20), stable since 10/03/2011 and probably benign. There is a 0.6 cm peripheral right lower lobe pulmonary nodule (5/30), unchanged, with associated stable surrounding bandlike peripheral reticulation in the right lower lobe. No additional new significant pulmonary nodules or acute consolidative airspace disease. Subsegmental atelectasis in the basilar left lower lobe.  Musculoskeletal: Mild degenerative changes in the thoracic spine. No aggressive appearing focal osseous lesions. Status post right mastectomy.  CT ABDOMEN AND PELVIS FINDINGS  Hepatobiliary: Diffuse hepatic steatosis. Stable 1.3 cm hyperenhancing right liver lobe focus (series 2/ image 52), which is occult on the delayed sequence and stable since 10/03/2011, most in keeping with a benign transient perfusional phenomena. No new  liver lesions. Stable focal fundal adenomyomatosis in the otherwise normal gallbladder, with no radiopaque cholelithiasis. No biliary ductal dilatation.  Pancreas: Normal, with no mass or duct dilation.  Spleen: Normal size spleen with no splenic mass.  Adrenals/Urinary Tract: Normal right adrenal. Left adrenal 1.0 cm nodule (2/61), unchanged since 10/03/2011, in keeping with a benign adenoma. Normal kidneys, with no hydronephrosis. Normal caliber ureters. Normal urinary bladder.  Stomach/Bowel: Collapsed and grossly normal stomach. Normal caliber small bowel, with no small bowel wall thickening. Normal appendix. Normal large bowel.  Vascular/Lymphatic: Atherosclerotic nonaneurysmal abdominal aorta. Patent portal, splenic and renal veins. No abdominopelvic lymphadenopathy.  Reproductive: Normal anteverted uterus.  No adnexal mass.  Other: No pneumoperitoneum, ascites or focal fluid collection.  Musculoskeletal: Asymmetric prominent osteoarthritis in the left hip joint, unchanged. Moderate degenerative changes in the lumbar spine. Stable small sclerotic lesion in the left sacrum (2/94). Stable small sclerotic anterior left iliac bone focus (2/91), stable since 2005 and likely benign. No new aggressive appearing focal osseous lesions.  IMPRESSION: 1. New indeterminate 3 mm right upper lobe pulmonary nodule, metastasis not excluded. Two additional subcentimeter right pulmonary nodules are stable. Recommend attention on follow-up chest CT in 3 months. 2. New trace layering right pleural effusion. 3. Stable sclerotic left sacral osseous metastasis. 4. Otherwise no potential sites of metastatic disease in the chest, abdomen or pelvis. 5. Diffuse hepatic steatosis. 6. RECIST findings as above.   Electronically Signed   By: Ilona Sorrel M.D.   On: 04/19/2015 17:19   Mm Digital Screening Unilat L  03/22/2015   CLINICAL DATA:  Screening.  EXAM: DIGITAL SCREENING UNILATERAL LEFT MAMMOGRAM WITH CAD  COMPARISON:  Previous  exam(s).  ACR Breast Density Category b: There are scattered areas of fibroglandular density.  FINDINGS: There are no findings suspicious for malignancy. Images were processed with CAD.  IMPRESSION: No mammographic evidence of malignancy. A result letter of this screening mammogram will be mailed directly to the patient.  RECOMMENDATION: Screening mammogram in one year. (Code:[id number]-B-01Y)  BI-RADS CATEGORY  1: Negative.   Electronically Signed   By: Lajean Manes M.D.   On: 03/22/2015 13:28      ASSESSMENT: 64 y.o. Barbara Parks,  woman with stage IV breast cancer, on BOLERO-4 trial  (1) status post right mastectomy and sentinel lymph node sampling 03/22/2004 for a pT2 pN1, stage IIB invasive ductal carcinoma with lobular features, grade 2, estrogen receptor and progesterone receptor positive, HER-2 negative, with an MIB-1 of 9% ([id number] and [id number])  (2) addition all right axillary lymph node sampling 05/07/2004 showed 2 benign lymph nodes (4 lymph nodes previously removed, so total was one positive lymph node out of 6; [id number])  (3) the patient was evaluated by radiation oncology; no postmastectomy radiation recommended  (4) adjuvant chemotherapy with dose dense doxorubicin and cyclophosphamide x4 cycles (first cycle delayed one week) followed by dose dense paclitaxel x4 was completed 09/18/2004  (5) tamoxifen started March 2006, discontinued 2009  METASTATIC DISEASE: (6) presenting with a large pericardial effusion, large right pleural effusion and possible right middle lobe bronchial obstruction February 2013, status post pericardial window placement, fiberoptic bronchoscopy and right Pleurx placement 09/11/2011, with biopsy of the bronchus intermedius and pericardium positive for metastatic breast cancer, estrogen receptor 91% positive with moderate staining intensity, progesterone receptor 100% positive with strong staining intensity, with an MIB-1 of 35%, and no HER-2 amplification, the signals  ratio being 1.37 ([id number])  (7) enrolled in BOLERO-4 trial 10/10/2011, receiving letrozole and everolimus  (a) two small areas of enhancement in the cerebellum noted by brain MRI 10/03/2011 were no longer apparent on repeat MRI 08/21/2012-- most recent brain MRI 04/01/2014 showed no evidence of intracranial metastatic disease  (b) sclerotic lesions in left iliac bone and sacrum have not been biopsied; stable; plan is to start zolendronate after patient updates her dental care (extraction planned)  (c) RLL lung nodule, stable (rescanned 07/12/2014 and 08/11/2014) R hilar and subcarinal lymph nodes: stable  (9) additional problems:  (a) hepatic steatosis  (b) COPD/ emphysema/ asthma  (c) advanced L hip osteoarthritis  (d) aortoiliac atherosclerosis  (e) dental evaluation pending w possible dental extractions  (f) possible thalassemia trait  (10) Bone density concerns: DEXA scan 08/11/2013 was normal   PLAN: I reviewed the CT scans with Barbara Parks, and again they show stable disease and hepatic steatosis. A new finding included an "indeterminate" 74m right upper lobe pulmonary nodule. Her other 2 pulmonary nodules were unchanged. A repeat chest CT in 3 months was recommended, but of course because of the BOLERO-4 trial, she will have scans every 8 weeks anyway. She will continue on the letrozole and everolimus as prescribed.  We briefly discussed her vitamin D supplement, which she has not taken all summer because she has been fairly active outside. She will resume this supplement now that we are heading into Fall due to her history of osteopenia. She will be due for a repeat bone density scan this upcoming January, and I have placed orders for this during our visit.   [place] return in 4 weeks for labs and a follow up  visit. She understands and agrees with this plan. She knows the goal of treatment in her case is control. She has been encouraged to call with any issues that might arise before  her next visit here.    Laurie Panda, NP   04/20/2015 9:23 AM

## 2015-04-20 NOTE — Telephone Encounter (Signed)
Appointments made and avs printed for patient °

## 2015-04-21 ENCOUNTER — Other Ambulatory Visit: Payer: Self-pay | Admitting: *Deleted

## 2015-04-21 DIAGNOSIS — C7800 Secondary malignant neoplasm of unspecified lung: Principal | ICD-10-CM

## 2015-04-21 DIAGNOSIS — C50911 Malignant neoplasm of unspecified site of right female breast: Secondary | ICD-10-CM

## 2015-05-03 DIAGNOSIS — Z01810 Encounter for preprocedural cardiovascular examination: Secondary | ICD-10-CM | POA: Diagnosis not present

## 2015-05-13 ENCOUNTER — Other Ambulatory Visit: Payer: Self-pay | Admitting: Oncology

## 2015-05-18 ENCOUNTER — Encounter: Payer: Self-pay | Admitting: Nurse Practitioner

## 2015-05-18 ENCOUNTER — Ambulatory Visit (HOSPITAL_BASED_OUTPATIENT_CLINIC_OR_DEPARTMENT_OTHER): Payer: Medicare Other | Admitting: Nurse Practitioner

## 2015-05-18 ENCOUNTER — Other Ambulatory Visit: Payer: Self-pay | Admitting: *Deleted

## 2015-05-18 ENCOUNTER — Other Ambulatory Visit (HOSPITAL_BASED_OUTPATIENT_CLINIC_OR_DEPARTMENT_OTHER): Payer: Medicare Other

## 2015-05-18 ENCOUNTER — Encounter: Payer: Self-pay | Admitting: *Deleted

## 2015-05-18 ENCOUNTER — Other Ambulatory Visit: Payer: Self-pay | Admitting: Oncology

## 2015-05-18 ENCOUNTER — Other Ambulatory Visit (HOSPITAL_COMMUNITY)
Admission: RE | Admit: 2015-05-18 | Discharge: 2015-05-18 | Disposition: A | Discharge status: Home / Self Care | Payer: Medicare Other | Source: Ambulatory Visit | Attending: Oncology | Admitting: Oncology

## 2015-05-18 ENCOUNTER — Telehealth: Payer: Self-pay | Admitting: Oncology

## 2015-05-18 VITALS — BP 119/57 | HR 95 | Temp 97.8°F | Resp 18 | Ht 65.0 in | Wt 168.2 lb

## 2015-05-18 DIAGNOSIS — Z9221 Personal history of antineoplastic chemotherapy: Secondary | ICD-10-CM | POA: Diagnosis not present

## 2015-05-18 DIAGNOSIS — C50911 Malignant neoplasm of unspecified site of right female breast: Secondary | ICD-10-CM

## 2015-05-18 DIAGNOSIS — C7989 Secondary malignant neoplasm of other specified sites: Secondary | ICD-10-CM

## 2015-05-18 DIAGNOSIS — C7800 Secondary malignant neoplasm of unspecified lung: Secondary | ICD-10-CM

## 2015-05-18 DIAGNOSIS — C773 Secondary and unspecified malignant neoplasm of axilla and upper limb lymph nodes: Secondary | ICD-10-CM | POA: Diagnosis not present

## 2015-05-18 DIAGNOSIS — C50811 Malignant neoplasm of overlapping sites of right female breast: Secondary | ICD-10-CM

## 2015-05-18 DIAGNOSIS — Z79811 Long term (current) use of aromatase inhibitors: Secondary | ICD-10-CM | POA: Diagnosis not present

## 2015-05-18 DIAGNOSIS — Z006 Encounter for examination for normal comparison and control in clinical research program: Secondary | ICD-10-CM

## 2015-05-18 DIAGNOSIS — M1612 Unilateral primary osteoarthritis, left hip: Secondary | ICD-10-CM

## 2015-05-18 LAB — CBC WITH DIFFERENTIAL/PLATELET
BASO%: 0.9 % (ref 0.0–2.0)
BASOS ABS: 0.1 10*3/uL (ref 0.0–0.1)
EOS ABS: 0.1 10*3/uL (ref 0.0–0.5)
EOS%: 1.7 % (ref 0.0–7.0)
HCT: 40.7 % (ref 34.8–46.6)
HGB: 13.4 g/dL (ref 11.6–15.9)
LYMPH%: 10.3 % — ABNORMAL LOW (ref 14.0–49.7)
MCH: 25.9 pg (ref 25.1–34.0)
MCHC: 32.9 g/dL (ref 31.5–36.0)
MCV: 78.6 fL — AB (ref 79.5–101.0)
MONO#: 0.8 10*3/uL (ref 0.1–0.9)
MONO%: 10.1 % (ref 0.0–14.0)
NEUT#: 6.3 10*3/uL (ref 1.5–6.5)
NEUT%: 77 % — AB (ref 38.4–76.8)
Platelets: 264 10*3/uL (ref 145–400)
RBC: 5.18 10*6/uL (ref 3.70–5.45)
RDW: 13.7 % (ref 11.2–14.5)
WBC: 8.1 10*3/uL (ref 3.9–10.3)
lymph#: 0.8 10*3/uL — ABNORMAL LOW (ref 0.9–3.3)

## 2015-05-18 LAB — COMPREHENSIVE METABOLIC PANEL (CC13)
ALK PHOS: 244 U/L — AB (ref 40–150)
ALT: 32 U/L (ref 0–55)
AST: 40 U/L — AB (ref 5–34)
Albumin: 3.4 g/dL — ABNORMAL LOW (ref 3.5–5.0)
Anion Gap: 8 mEq/L (ref 3–11)
BUN: 18.3 mg/dL (ref 7.0–26.0)
CALCIUM: 10.1 mg/dL (ref 8.4–10.4)
CHLORIDE: 110 meq/L — AB (ref 98–109)
CO2: 22 mEq/L (ref 22–29)
Creatinine: 1 mg/dL (ref 0.6–1.1)
EGFR: 62 mL/min/{1.73_m2} — AB (ref >90)
GLUCOSE: 128 mg/dL (ref 70–140)
POTASSIUM: 3.8 meq/L (ref 3.5–5.1)
SODIUM: 140 meq/L (ref 136–145)
Total Bilirubin: <0.3 mg/dL (ref 0.20–1.20)
Total Protein: 7.7 g/dL (ref 6.4–8.3)

## 2015-05-18 LAB — LIPID PANEL
CHOL/HDL RATIO: 6 ratio — AB (ref <=5.0)
CHOLESTEROL: 256 mg/dL — AB (ref 125–200)
HDL: 43 mg/dL — AB (ref >=46)
LDL Cholesterol: 170 mg/dL — ABNORMAL HIGH (ref <130)
Triglycerides: 217 mg/dL — ABNORMAL HIGH (ref <150)
VLDL: 43 mg/dL — ABNORMAL HIGH (ref <30)

## 2015-05-18 LAB — URIC ACID (CC13): Uric Acid, Serum: 3.5 mg/dl (ref 2.6–7.4)

## 2015-05-18 LAB — BILIRUBIN, DIRECT

## 2015-05-18 LAB — CK: Total CK: 87 U/L (ref 38–234)

## 2015-05-18 LAB — PHOSPHORUS: PHOSPHORUS: 2.6 mg/dL (ref 2.5–4.6)

## 2015-05-18 MED ORDER — INV-EVEROLIMUS (RAD0001) 5MG TABLET NOVARTIS CRAD001Y24135: 2.0000 | ORAL_TABLET | Freq: Every day | ORAL | Status: DC

## 2015-05-18 MED ORDER — METOPROLOL TARTRATE 25 MG PO TABS: 12.5000 mg | ORAL_TABLET | Freq: Every day | ORAL | Status: DC

## 2015-05-18 NOTE — Progress Notes (Signed)
05/18/15 at 3:18pm- [employer]/ Bolero 4- cycle 48, day 1 study notes- The pt was into the cancer center this am for her fasting labs and cycle 48, day 1assessments. The pt confirmed that she was fasting upon arrival to the cancer center for her labs. The pt was seen and examined today by Dr. Virgie Dad NP, Gentry Fitz. She reviewed the pt's labs today, and she felt that her abnormal lab values were "not clinically significant". The pt's vitals and weight were stable, and her mean BP was obtained for study purposes.The pt reports the following AE's as ongoing: hot flashes, memory impairment, intermittent nausea, fatigue, back pain, and bilateral knee pain. The pt denies any new AE's. She reports the following concomitant medications as ongoing: Lopressor, compazine prn, immodium prn, lopid, ibuprofen prn, and prilosec. She stopped her vitamin D on 05/13/15 due to her upcoming surgery.  She is performing all of her usual activities with some limitations. ECOG=1. The pt said that she overall feels great and is looking forward to her surgery next month.  Her planned hip replacement surgery is scheduled for 06/07/15. The pt was instructed to remain on her study drug without interruptions except for the day of her surgery due to her NPO status on the day of her surgery.  Dr. Jana Hakim stated that he does not want the pt to stop her study drug prior to her surgery.  The pt's surgery is scheduled for a late afternoon case.  The pt was informed to resume her everolimus and letrozole the day after her surgery.  The pt verbalized understanding.  The research nurse will notify the pharmacy staff at [facility] that the pt is enrolled on a clinical trial, and the nurse will ask the pharmacy team to be aware of any potential interactions with her everolimus.  The pt has had osteoarthritis involving the left hip since screening. The pt's planned surgery is not considered a SAE per the study team (per email from Evon Slack,  [employer], on 02/22/15).  The pt's lipid panel is pending. The pt returned her cycle 47 everolimus study drug boxes. She confirmed that she took her everolimus (10 mg) every day along with letrozole (2.5 mg) from 04/19/15 - 05/17/15 (29 days). The pt's everolimus drug boxes had 26 remaining (unopened) blister packs. The pt was dispensed 84 pills - 58 taken (29 days x 2 pills= 58) =26 pills. Therefore, the pt is 100% compliant with her study drug, everolimus, for cycle 47. The pt's everolimus was taken to the pharmacy for the drug accountability check and storage. The pt was dispensed her cycle 48 everolimus study drug boxes for self administration. The pt is aware of her November appointments.   Brion Aliment RN, BSN, CCRP Clinical Research Nurse 05/18/2015 3:28 PM   05/19/15 at 10:35am - The pt's lipid panel was stable.   Brion Aliment RN, BSN, CCRP  Clinical Research Nurse 05/19/2015 10:35 AM

## 2015-05-18 NOTE — Telephone Encounter (Signed)
Called and left a message with 12/15 appointment

## 2015-05-18 NOTE — Progress Notes (Signed)
[place]  [phone] [phone]     ID: KYMORAH KORF DOB: 05/08/1951  MR#: [id number]  AJO#:[id number]  Patient Care Team: Darcus Austin, MD as PCP - General (Family Medicine) Jerline Pain, MD (Cardiology) Gaye Pollack, MD (Cardiothoracic Surgery) Chauncey Cruel, MD as Consulting Physician (Oncology) OTHER MD: Dr Erroll Luna- Merlene Laughter, DDS; Gaynelle Arabian MD  CHIEF COMPLAINT: metastatic breast cancer, on BOLERO study  CURRENT TREATMENT: letrozole + everolimus  BREAST CANCER HISTORY: From Dr. Collier [name]'s original intake note 04/13/2004:  "This woman has been in good health all of her life. She recently moved from [place] to work here.  She palpated a mass at about the 12 o'clock position in mid-July. She has not noticed any nipple retraction or skin changes.  She was seen by her primary care doctor who subsequently referred her for a mammogram.  Mammogram was performed on 03/01/04. This demonstrated a spiculated 2 cm mass at the 12 o'clock position in the right breast.  Upper outer quadrant of the left breast shows some distortion.   Physical exam at that time showed a firm, nontender nodule at the 12 o'clock position in the right breast, 5 cm from the nipple.  Ultrasound of this area showed a hypoechoic ill-defined mass, measuring 2.2 x 1.3 x 1.4 cm.  Physical exam of the left breast showed general vague thickening, upper outer quadrant of the left breast with a discrete palpable mass. The ultrasound performed showed a single hypoechoic ill-defined nodule at the 1 o'clock position, measuring 7 x 5 x 6 mm.  She had biopsies of both lesions on 03/02/03.  Needle core biopsy of the lesion on the right breast revealed invasive mammary carcinoma.  Needle core biopsy of the left breast showed a complex fibroadenoma.  Prognostic panel of the lesion on the right breast showed it to be ER positive at 73%, PR positive at 90% and proliferative index 9%, HER-2  was 1+.  Patient was referred to Dr. Annamaria Boots, who performed a simple mastectomy with sentinel lymph node evaluation on 03/22/04.  Final pathology showed this to be an invasive ductal carcinoma  with lobular features, measuring 2.2 cm, grade 2 of 3.  Margins negative for carcinoma.  Invasive ductal carcinoma was extended to involve deep dermis of the nipple.  Lymphovascular invasion was identified.  Total of 4 sentinel lymph nodes were evaluated.  Touch imprints at the time of the OR was felt to be negative.  Subsequent evaluation showed a 5 mm focus of metastatic carcinoma in one of the four lymph nodes on microscopic after sectioning.  There was extracapsular extension  of one lymph node as well. The remaining three lymph nodes were all negative."  The patient's subsequent history is detailed below.  INTERVAL HISTORY: Pia returns today for follow up of her metastatic breast cancer , accompanied by her research nurse [name]. Onalee continues to participate in the [employer] trial, currently on cycle 48. She continues on everolimus and letrozole. She offers no new complaints today. She has no mucositis or pneumonitis. She has mild hot flashes, but denies vaginal changes. She has a long standing history of left hip and knee pain, but anticipates a left ceramic hip placement by Dr. Maureen Ralphs early next month.  REVIEW OF SYSTEMS: Sherrice denies fevers, chills, nausea, vomiting or changes in bowel habits. She has lost 3lbs in the past 2 months. She denies shortness of breath beyond moments of exertion, chest pain, cough, or palpitations. She  has no headaches, dizziness, or vision changes. Her hearing loss is stable.    PAST MEDICAL HISTORY: Past Medical History  Diagnosis Date  . Asthma   . Shortness of breath   . Arthritis     left hip  . Contact dermatitis     from a bandaid that had Latex   . Osteopenia due to cancer therapy 09/09/2013  . Hypercholesteremia   . ADHD (attention deficit hyperactivity  disorder)   . breast ca 2005    breast/chemo R mastectomy  . Metastasis to lung dx'd 08/2011    PAST SURGICAL HISTORY: Past Surgical History  Procedure Laterality Date  . Spine surgery  1996  . Breast surgery  2005    right  . Video bronchoscopy  09/11/2011    Procedure: VIDEO BRONCHOSCOPY;  Surgeon: Gaye Pollack, MD;  Location: [facility];  Service: Thoracic;  Laterality: N/A;  . Chest tube insertion  09/11/2011    Procedure: INSERTION PLEURAL DRAINAGE CATHETER;  Surgeon: Gaye Pollack, MD;  Location: [facility];  Service: Thoracic;  Laterality: Right;  . Pericardial window  09/11/2011    Procedure: PERICARDIAL WINDOW;  Surgeon: Gaye Pollack, MD;  Location: [facility];  Service: Thoracic;  Laterality: N/A;  . Removal of pleural drainage catheter  12/19/2011    Procedure: REMOVAL OF PLEURAL DRAINAGE CATHETER;  Surgeon: Gaye Pollack, MD;  Location: [facility];  Service: Thoracic;  Laterality: Right;  TO BE DONE IN MINOR ROOM, SHORT STAY  . Cataract extraction w/phaco Left 01/18/2014    Procedure: CATARACT EXTRACTION PHACO AND INTRAOCULAR LENS PLACEMENT (IOC);  Surgeon: Elta Guadeloupe T. Gershon Crane, MD;  Location: [facility];  Service: Ophthalmology;  Laterality: Left;  CDE 15.79  . Cataract extraction w/phaco Right 02/08/2014    Procedure: CATARACT EXTRACTION PHACO AND INTRAOCULAR LENS PLACEMENT (IOC);  Surgeon: Elta Guadeloupe T. Gershon Crane, MD;  Location: [facility];  Service: Ophthalmology;  Laterality: Right;  CDE 4.41    FAMILY HISTORY Family History  Problem Relation Age of Onset  . Cancer Mother     breast  . Heart disease Father   . Anesthesia problems Neg Hx    the patient's mother was diagnosed with breast cancer at age 62, she died age 72 from congestive heart failure..The patient's father died from heart disease at age 55.  She has one sister alive & well.  Two brothers alive & well, one with diabetes. The patient's sister was also diagnosed with breast cancer, and was tested for the BRCA gene, and was negative. The patient  herself has not been tested. There is no history of ovarian cancer in the family  GYNECOLOGIC HISTORY:  No LMP recorded. Patient is postmenopausal. Menarche age 63, the patient is GX P0. She stopped having periods with chemotherapy in 2005. 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 She was a Archivist. She is single, lives alone with her Shitzu-poodle Sammie.. Family is all in the [place] area.     ADVANCED DIRECTIVES: Not in place. At the 02/23/2014 visit the patient was given the appropriate documents to complete and notarize at her discretion. She tells me she is planning to name her sister, Billie Ruddy, as healthcare power of attorney. [name] can be reached at [phone]   HEALTH MAINTENANCE: Social History  Substance Use Topics  . Smoking status: Former Smoker -- 1.50 packs/day for 30 years  Types: Cigarettes    Quit date: 09/10/2007  . Smokeless tobacco: Not on file  . Alcohol Use: No     Colonoscopy:  PAP:  Bone density: 08/11/2013, showed osteopenia  Lipid panel:  Allergies  Allergen Reactions  . Aspirin     REACTION: upset stomach  . Latex Other (See Comments)    Blistering and skin peels off  . Nsaids Nausea And Vomiting    Extreme nausea and vomiting    Current Outpatient Prescriptions  Medication Sig Dispense Refill  . gemfibrozil (LOPID) 600 MG tablet TAKE ONE TABLET BY MOUTH TWICE DAILY BEFORE A MEAL 60 tablet 0  . Investigational everolimus (RAD001) 5 MG tablet Novartis [id number] Take 2 tablets by mouth daily. Take with a glass of water. 70 tablet 0  . letrozole (FEMARA) 2.5 MG tablet TAKE ONE TABLET BY MOUTH ONCE DAILY 90 tablet 0  . omeprazole (PRILOSEC) 40 MG capsule Take 1 capsule (40 mg total) by mouth daily as needed. 90 capsule 3  . cholecalciferol (VITAMIN D) 1000 UNITS tablet Take 2,000 Units by mouth daily. Pt stopped  this supplement on Oct 15th, b/c of upcoming surgery    . Investigational everolimus (RAD001) 5 MG tablet Novartis [id number] Take 2 tablets by mouth daily. Take with a glass of water. 70 tablet 0  . loperamide (IMODIUM) 2 MG capsule Take 2 mg by mouth 4 (four) times daily as needed for diarrhea or loose stools.    . metoprolol tartrate (LOPRESSOR) 25 MG tablet Take 0.5 tablets (12.5 mg total) by mouth daily. 30 tablet 4  . prochlorperazine (COMPAZINE) 10 MG tablet Take 1 tablet (10 mg total) by mouth every 6 (six) hours as needed (Nausea or vomiting). (Patient not taking: Reported on 05/18/2015) 30 tablet 1   No current facility-administered medications for this visit.    OBJECTIVE: Middle-aged white woman who appears stated age 83 Vitals:   05/18/15 1120  BP: 119/57  Pulse:   Temp:   Resp:      Body mass index is 27.99 kg/(m^2).    ECOG FS:1 - Symptomatic but completely ambulatory  Skin: warm, dry  HEENT: sclerae anicteric, conjunctivae pink, oropharynx clear. No thrush or mucositis.  Lymph Nodes: No cervical or supraclavicular lymphadenopathy  Lungs: clear to auscultation bilaterally, no rales, wheezes, or rhonci  Heart: regular rate and rhythm  Abdomen: round, soft, non tender, positive bowel sounds  Musculoskeletal: No focal spinal tenderness, no peripheral edema, gait altered limited by left hip pain, ambulating with cane  Neuro: non focal, well oriented, positive affect  Breasts: right breast status post mastectomy. No evidence of recurrent disease. Right axilla benign. Left breast unremarkable.   LAB RESULTS:  CMP     Component Value Date/Time   NA 140 05/18/2015 1100   NA 136 07/16/2012 0910   K 3.8 05/18/2015 1100   K 4.0 07/16/2012 0910   CL 109* 12/31/2012 0940   CL 103 07/16/2012 0910   CO2 22 05/18/2015 1100   CO2 21 07/16/2012 0910   GLUCOSE 128 05/18/2015 1100   GLUCOSE 127* 12/31/2012 0940   GLUCOSE 140* 07/16/2012 0910   BUN 18.3 05/18/2015 1100    BUN 16 07/16/2012 0910   CREATININE 1.0 05/18/2015 1100   CREATININE 0.75 07/16/2012 0910   CALCIUM 10.1 05/18/2015 1100   CALCIUM 10.1 07/16/2012 0910   PROT 7.7 05/18/2015 1100   PROT 8.0 07/16/2012 0910   ALBUMIN 3.4* 05/18/2015 1100   ALBUMIN 3.7 07/16/2012 0910   AST 40*  05/18/2015 1100   AST 30 07/16/2012 0910   ALT 32 05/18/2015 1100   ALT 33 07/16/2012 0910   ALKPHOS 244* 05/18/2015 1100   ALKPHOS 142* 07/16/2012 0910   BILITOT <0.30 05/18/2015 1100   BILITOT 0.2* 07/16/2012 0910   GFRNONAA >90 09/13/2011 0440   GFRAA >90 09/13/2011 0440    I No results found for: SPEP  Lab Results  Component Value Date   WBC 8.1 05/18/2015   NEUTROABS 6.3 05/18/2015   HGB 13.4 05/18/2015   HCT 40.7 05/18/2015   MCV 78.6* 05/18/2015   PLT 264 05/18/2015      Chemistry      Component Value Date/Time   NA 140 05/18/2015 1100   NA 136 07/16/2012 0910   K 3.8 05/18/2015 1100   K 4.0 07/16/2012 0910   CL 109* 12/31/2012 0940   CL 103 07/16/2012 0910   CO2 22 05/18/2015 1100   CO2 21 07/16/2012 0910   BUN 18.3 05/18/2015 1100   BUN 16 07/16/2012 0910   CREATININE 1.0 05/18/2015 1100   CREATININE 0.75 07/16/2012 0910      Component Value Date/Time   CALCIUM 10.1 05/18/2015 1100   CALCIUM 10.1 07/16/2012 0910   ALKPHOS 244* 05/18/2015 1100   ALKPHOS 142* 07/16/2012 0910   AST 40* 05/18/2015 1100   AST 30 07/16/2012 0910   ALT 32 05/18/2015 1100   ALT 33 07/16/2012 0910   BILITOT <0.30 05/18/2015 1100   BILITOT 0.2* 07/16/2012 0910       Lab Results  Component Value Date   LABCA2 58* 09/14/2012    No components found for: [id number]  No results for input(s): INR in the last 168 hours.  Urinalysis    Component Value Date/Time   COLORURINE YELLOW 09/11/2011 0819   APPEARANCEUR CLEAR 09/11/2011 0819   LABSPEC 1.020 09/08/2014 0956   LABSPEC 1.013 09/11/2011 0819   PHURINE 6.0 09/08/2014 0956   PHURINE 6.5 09/11/2011 0819   GLUCOSEU Negative 09/08/2014 0956     GLUCOSEU NEGATIVE 09/11/2011 0819   HGBUR Large 09/08/2014 0956   HGBUR SMALL* 09/11/2011 0819   BILIRUBINUR Negative 09/08/2014 0956   BILIRUBINUR NEGATIVE 09/11/2011 0819   KETONESUR Negative 09/08/2014 0956   KETONESUR 15* 09/11/2011 0819   PROTEINUR 30 09/08/2014 0956   PROTEINUR NEGATIVE 09/11/2011 0819   UROBILINOGEN 0.2 09/08/2014 0956   UROBILINOGEN 0.2 09/11/2011 0819   NITRITE Negative 09/08/2014 0956   NITRITE NEGATIVE 09/11/2011 0819   LEUKOCYTESUR Negative 09/08/2014 0956   LEUKOCYTESUR NEGATIVE 09/11/2011 0819    STUDIES: Ct Chest W Contrast  04/19/2015  CLINICAL DATA:  64 year old female with right breast cancer diagnosed in 2005 metastatic to the lungs diagnosed in 2013 on investigational drug chemotherapy trial. Restaging. EXAM: CT CHEST, ABDOMEN, AND PELVIS WITH CONTRAST TECHNIQUE: Multidetector CT imaging of the chest, abdomen and pelvis was performed following the standard protocol during bolus administration of intravenous contrast. CONTRAST:  19m OMNIPAQUE IOHEXOL 300 MG/ML SOLN, 1017mOMNIPAQUE IOHEXOL 300 MG/ML SOLN COMPARISON:  02/21/2015 CT of the chest, abdomen and pelvis. FINDINGS: RECIST 1.0 Target Lesions: 1. Right lower lobe lung nodule (series 5/image 38): 0.6 cm, previously 0.6 cm 2. Subcarinal lymph node: 0.6 cm, previously 0.6 cm with stable coarse internal calcifications 3. Right hilar lymph node:  0.8 cm, previously 0.7 cm Non-target Lesions: 1. Small right pleural effusion: New trace layering right pleural effusion, previously resolved 2. Superior segment right lower lobe lung nodule: Resolved (the superior segment right lower lobe lung nodule  described on the baseline 10/03/2011 chest CT study is not apparent on the current scan, as on the prior chest CT) CT CHEST FINDINGS Mediastinum/Nodes: Normal heart size. No pericardial fluid/thickening. Atherosclerotic nonaneurysmal thoracic aorta. No central pulmonary emboli. Subcentimeter hypodense nodule in the  right thyroid lobe, likely unchanged, better visualized on today's scan. Normal esophagus. Right axillary surgical clips are again noted. No pathologically enlarged axillary, mediastinal or hilar lymph nodes. Stable coarsely calcified 0.6 cm subcarinal node. Right hilar 0.8 cm top normal size node (2/27), previously 0.7 cm, not appreciably changed. Lungs/Pleura: New trace layering right pleural effusion. No left pleural effusion. No pneumothorax. There is a new 3 mm right upper lobe solid pulmonary nodule (series 5/ image 21). There is a peripheral right upper lobe 2 mm pulmonary nodule (5/20), stable since 10/03/2011 and probably benign. There is a 0.6 cm peripheral right lower lobe pulmonary nodule (5/30), unchanged, with associated stable surrounding bandlike peripheral reticulation in the right lower lobe. No additional new significant pulmonary nodules or acute consolidative airspace disease. Subsegmental atelectasis in the basilar left lower lobe. Musculoskeletal: Mild degenerative changes in the thoracic spine. No aggressive appearing focal osseous lesions. Status post right mastectomy. CT ABDOMEN AND PELVIS FINDINGS Hepatobiliary: Diffuse hepatic steatosis. Stable 1.3 cm hyperenhancing right liver lobe focus (series 2/ image 52), which is occult on the delayed sequence and stable since 10/03/2011, most in keeping with a benign transient perfusional phenomena. No new liver lesions. Stable focal fundal adenomyomatosis in the otherwise normal gallbladder, with no radiopaque cholelithiasis. No biliary ductal dilatation. Pancreas: Normal, with no mass or duct dilation. Spleen: Normal size spleen with no splenic mass. Adrenals/Urinary Tract: Normal right adrenal. Left adrenal 1.0 cm nodule (2/61), unchanged since 10/03/2011, in keeping with a benign adenoma. Normal kidneys, with no hydronephrosis. Normal caliber ureters. Normal urinary bladder. Stomach/Bowel: Collapsed and grossly normal stomach. Normal caliber  small bowel, with no small bowel wall thickening. Normal appendix. Normal large bowel. Vascular/Lymphatic: Atherosclerotic nonaneurysmal abdominal aorta. Patent portal, splenic and renal veins. No abdominopelvic lymphadenopathy. Reproductive: Normal anteverted uterus.  No adnexal mass. Other: No pneumoperitoneum, ascites or focal fluid collection. Musculoskeletal: Asymmetric prominent osteoarthritis in the left hip joint, unchanged. Moderate degenerative changes in the lumbar spine. Stable small sclerotic lesion in the left sacrum (2/94). Stable small sclerotic anterior left iliac bone focus (2/91), stable since 2005 and likely benign. No new aggressive appearing focal osseous lesions. IMPRESSION: 1. New indeterminate 3 mm right upper lobe pulmonary nodule, metastasis not excluded. Two additional subcentimeter right pulmonary nodules are stable. Recommend attention on follow-up chest CT in 3 months. 2. New trace layering right pleural effusion. 3. Stable sclerotic left sacral osseous metastasis. 4. Otherwise no potential sites of metastatic disease in the chest, abdomen or pelvis. 5. Diffuse hepatic steatosis. 6. RECIST findings as above. Electronically Signed   By: Ilona Sorrel M.D.   On: 04/19/2015 17:19   Ct Abdomen Pelvis W Contrast  04/19/2015  CLINICAL DATA:  64 year old female with right breast cancer diagnosed in 2005 metastatic to the lungs diagnosed in 2013 on investigational drug chemotherapy trial. Restaging. EXAM: CT CHEST, ABDOMEN, AND PELVIS WITH CONTRAST TECHNIQUE: Multidetector CT imaging of the chest, abdomen and pelvis was performed following the standard protocol during bolus administration of intravenous contrast. CONTRAST:  36m OMNIPAQUE IOHEXOL 300 MG/ML SOLN, 1059mOMNIPAQUE IOHEXOL 300 MG/ML SOLN COMPARISON:  02/21/2015 CT of the chest, abdomen and pelvis. FINDINGS: RECIST 1.0 Target Lesions: 1. Right lower lobe lung nodule (series 5/image 38): 0.6 cm,  previously 0.6 cm 2. Subcarinal  lymph node: 0.6 cm, previously 0.6 cm with stable coarse internal calcifications 3. Right hilar lymph node:  0.8 cm, previously 0.7 cm Non-target Lesions: 1. Small right pleural effusion: New trace layering right pleural effusion, previously resolved 2. Superior segment right lower lobe lung nodule: Resolved (the superior segment right lower lobe lung nodule described on the baseline 10/03/2011 chest CT study is not apparent on the current scan, as on the prior chest CT) CT CHEST FINDINGS Mediastinum/Nodes: Normal heart size. No pericardial fluid/thickening. Atherosclerotic nonaneurysmal thoracic aorta. No central pulmonary emboli. Subcentimeter hypodense nodule in the right thyroid lobe, likely unchanged, better visualized on today's scan. Normal esophagus. Right axillary surgical clips are again noted. No pathologically enlarged axillary, mediastinal or hilar lymph nodes. Stable coarsely calcified 0.6 cm subcarinal node. Right hilar 0.8 cm top normal size node (2/27), previously 0.7 cm, not appreciably changed. Lungs/Pleura: New trace layering right pleural effusion. No left pleural effusion. No pneumothorax. There is a new 3 mm right upper lobe solid pulmonary nodule (series 5/ image 21). There is a peripheral right upper lobe 2 mm pulmonary nodule (5/20), stable since 10/03/2011 and probably benign. There is a 0.6 cm peripheral right lower lobe pulmonary nodule (5/30), unchanged, with associated stable surrounding bandlike peripheral reticulation in the right lower lobe. No additional new significant pulmonary nodules or acute consolidative airspace disease. Subsegmental atelectasis in the basilar left lower lobe. Musculoskeletal: Mild degenerative changes in the thoracic spine. No aggressive appearing focal osseous lesions. Status post right mastectomy. CT ABDOMEN AND PELVIS FINDINGS Hepatobiliary: Diffuse hepatic steatosis. Stable 1.3 cm hyperenhancing right liver lobe focus (series 2/ image 52), which is  occult on the delayed sequence and stable since 10/03/2011, most in keeping with a benign transient perfusional phenomena. No new liver lesions. Stable focal fundal adenomyomatosis in the otherwise normal gallbladder, with no radiopaque cholelithiasis. No biliary ductal dilatation. Pancreas: Normal, with no mass or duct dilation. Spleen: Normal size spleen with no splenic mass. Adrenals/Urinary Tract: Normal right adrenal. Left adrenal 1.0 cm nodule (2/61), unchanged since 10/03/2011, in keeping with a benign adenoma. Normal kidneys, with no hydronephrosis. Normal caliber ureters. Normal urinary bladder. Stomach/Bowel: Collapsed and grossly normal stomach. Normal caliber small bowel, with no small bowel wall thickening. Normal appendix. Normal large bowel. Vascular/Lymphatic: Atherosclerotic nonaneurysmal abdominal aorta. Patent portal, splenic and renal veins. No abdominopelvic lymphadenopathy. Reproductive: Normal anteverted uterus.  No adnexal mass. Other: No pneumoperitoneum, ascites or focal fluid collection. Musculoskeletal: Asymmetric prominent osteoarthritis in the left hip joint, unchanged. Moderate degenerative changes in the lumbar spine. Stable small sclerotic lesion in the left sacrum (2/94). Stable small sclerotic anterior left iliac bone focus (2/91), stable since 2005 and likely benign. No new aggressive appearing focal osseous lesions. IMPRESSION: 1. New indeterminate 3 mm right upper lobe pulmonary nodule, metastasis not excluded. Two additional subcentimeter right pulmonary nodules are stable. Recommend attention on follow-up chest CT in 3 months. 2. New trace layering right pleural effusion. 3. Stable sclerotic left sacral osseous metastasis. 4. Otherwise no potential sites of metastatic disease in the chest, abdomen or pelvis. 5. Diffuse hepatic steatosis. 6. RECIST findings as above. Electronically Signed   By: Ilona Sorrel M.D.   On: 04/19/2015 17:19      ASSESSMENT: 64 y.o. [place], [place]  woman with stage IV breast cancer, on BOLERO-4 trial  (1) status post right mastectomy and sentinel lymph node sampling 03/22/2004 for a pT2 pN1, stage IIB invasive ductal carcinoma with lobular features, grade  2, estrogen receptor and progesterone receptor positive, HER-2 negative, with an MIB-1 of 9% ([id number] and [id number])  (2) addition all right axillary lymph node sampling 05/07/2004 showed 2 benign lymph nodes (4 lymph nodes previously removed, so total was one positive lymph node out of 6; [phone])  (3) the patient was evaluated by radiation oncology; no postmastectomy radiation recommended  (4) adjuvant chemotherapy with dose dense doxorubicin and cyclophosphamide x4 cycles (first cycle delayed one week) followed by dose dense paclitaxel x4 was completed 09/18/2004  (5) tamoxifen started March 2006, discontinued 2009  METASTATIC DISEASE: (6) presenting with a large pericardial effusion, large right pleural effusion and possible right middle lobe bronchial obstruction February 2013, status post pericardial window placement, fiberoptic bronchoscopy and right Pleurx placement 09/11/2011, with biopsy of the bronchus intermedius and pericardium positive for metastatic breast cancer, estrogen receptor 91% positive with moderate staining intensity, progesterone receptor 100% positive with strong staining intensity, with an MIB-1 of 35%, and no HER-2 amplification, the signals ratio being 1.37 ([id number])  (7) enrolled in BOLERO-4 trial 10/10/2011, receiving letrozole and everolimus  (a) two small areas of enhancement in the cerebellum noted by brain MRI 10/03/2011 were no longer apparent on repeat MRI 08/21/2012-- most recent brain MRI 04/01/2014 showed no evidence of intracranial metastatic disease  (b) sclerotic lesions in left iliac bone and sacrum have not been biopsied; stable; plan is to start zolendronate after patient updates her dental care (extraction planned)  (c) RLL lung nodule,  stable (rescanned 07/12/2014 and 08/11/2014) R hilar and subcarinal lymph nodes: stable  (9) additional problems:  (a) hepatic steatosis  (b) COPD/ emphysema/ asthma  (c) advanced L hip osteoarthritis  (d) aortoiliac atherosclerosis  (e) dental evaluation pending w possible dental extractions  (f) possible thalassemia trait  (10) Bone density concerns: DEXA scan 08/11/2013 was normal   PLAN: Ocie is looks and feels well today. The labs were reviewed in detail and were stable. She will continue on the letrozole and everolimus as prescribed by the BOLERO-4 trial.   We discussed the hip surgery, and she believes the deterioration is in no way related to the research study. Prior to being diagnosed with breast cancer, she was aware that her left hip would need to be replaced, but this got put on the "back burner" as news of her cancer arose. She does not feel that either drug has caused an acceleration of her hip pain or condition. This is an elective surgery and she is quite frankly looking forward to it.  [place] is to have repeat CT scans just 6 days after surgery, but she says Dr. Maureen Ralphs believes that she will be fairly mobile and able to complete this exam by this time.  Lakayla will return in 4 weeks for labs and a follow up visit to review the scans. She understands and agrees with this plan. She knows the goal of treatment in her case is control. She has been encouraged to call with any issues that might arise before her next visit here.    Laurie Panda, NP   05/18/2015 1:32 PM

## 2015-05-23 ENCOUNTER — Ambulatory Visit: Payer: Self-pay | Admitting: Orthopedic Surgery

## 2015-05-23 NOTE — Progress Notes (Signed)
Preoperative surgical orders have been place into the Epic hospital system for Barbara Parks on 05/23/2015, 5:19 PM  by Mickel Crow for surgery on 06-07-15.  Preop Total Hip - Anterior Approach orders including IV Tylenol, and IV Decadron as long as there are no contraindications to the above medications. Arlee Muslim, PA-C

## 2015-05-26 ENCOUNTER — Encounter (HOSPITAL_COMMUNITY): Payer: Self-pay

## 2015-05-26 NOTE — Patient Instructions (Addendum)
YOUR PROCEDURE IS SCHEDULED ON :  06/07/15  REPORT TO [facility] MAIN ENTRANCE FOLLOW SIGNS TO EAST ELEVATOR - GO TO 3rd FLOOR CHECK IN AT 3 EAST NURSES STATION (SHORT STAY) AT:  1:15 PM  CALL THIS NUMBER IF YOU HAVE PROBLEMS THE MORNING OF SURGERY [phone]  REMEMBER:ONLY 1 PER PERSON MAY GO TO SHORT STAY WITH YOU TO GET READY THE MORNING OF YOUR SURGERY  DO NOT EAT FOOD  AFTER MIDNIGHT  MAY HAVE CLEAR LIQUIDS UNTIL 9:15 AM  TAKE THESE MEDICINES THE MORNING OF SURGERY: METOPROLOL / [name] Allowed                                                                     Foods Excluded  Coffee and tea, regular and decaf                             liquids that you cannot  Plain Jell-O in any flavor                                             see through such as: Fruit ices (not with fruit pulp)                                     milk, soups, orange juice  Iced Popsicles                                                 All solid food Carbonated beverages, regular and diet                                    Cranberry, grape and apple juices Sports drinks like Gatorade Lightly seasoned clear broth or consume(fat free) Sugar, honey syrup _____________________________________________________________________    YOU MAY NOT HAVE ANY METAL ON YOUR BODY INCLUDING HAIR PINS AND PIERCING'S. DO NOT WEAR JEWELRY, MAKEUP, LOTIONS, POWDERS OR PERFUMES. DO NOT WEAR NAIL POLISH. DO NOT SHAVE 48 HRS PRIOR TO SURGERY. MEN MAY SHAVE FACE AND NECK.  DO NOT [name]. [name] IS NOT RESPONSIBLE FOR VALUABLES.  CONTACTS, DENTURES OR PARTIALS MAY NOT BE WORN TO SURGERY. LEAVE SUITCASE IN CAR. CAN BE BROUGHT TO ROOM AFTER SURGERY.  PATIENTS DISCHARGED THE DAY OF SURGERY WILL NOT BE ALLOWED TO DRIVE HOME.  PLEASE READ OVER THE FOLLOWING INSTRUCTION  SHEETS _________________________________________________________________________________                                          [name] - PREPARING FOR SURGERY  Before surgery, you can play an important role.  Because skin is not sterile, your  skin needs to be as free of germs as possible.  You can reduce the number of germs on your skin by washing with CHG (chlorahexidine gluconate) soap before surgery.  CHG is an antiseptic cleaner which kills germs and bonds with the skin to continue killing germs even after washing. Please DO NOT use if you have an allergy to CHG or antibacterial soaps.  If your skin becomes reddened/irritated stop using the CHG and inform your nurse when you arrive at Short Stay. Do not shave (including legs and underarms) for at least 48 hours prior to the first CHG shower.  You may shave your face. Please follow these instructions carefully:   1.  Shower with CHG Soap the night before surgery and the  morning of Surgery.   2.  If you choose to wash your hair, wash your hair first as usual with your  normal  Shampoo.   3.  After you shampoo, rinse your hair and body thoroughly to remove the  shampoo.                                         4.  Use CHG as you would any other liquid soap.  You can apply chg directly  to the skin and wash . Gently wash with scrungie or clean wascloth    5.  Apply the CHG Soap to your body ONLY FROM THE NECK DOWN.   Do not use on open                           Wound or open sores. Avoid contact with eyes, ears mouth and genitals (private parts).                        Genitals (private parts) with your normal soap.              6.  Wash thoroughly, paying special attention to the area where your surgery  will be performed.   7.  Thoroughly rinse your body with warm water from the neck down.   8.  DO NOT shower/wash with your normal soap after using and rinsing off  the CHG Soap .                9.  Pat yourself dry with a clean  towel.             10.  Wear clean night clothes to bed after shower             11.  Place clean sheets on your bed the night of your first shower and do not  sleep with pets.  Day of Surgery : Do not apply any lotions/deodorants the morning of surgery.  Please wear clean clothes to the hospital/surgery center.  FAILURE TO FOLLOW THESE INSTRUCTIONS MAY RESULT IN THE CANCELLATION OF YOUR SURGERY    PATIENT SIGNATURE_________________________________  ______________________________________________________________________     Barbara Parks  An incentive spirometer is a tool that can help keep your lungs clear and active. This tool measures how well you are filling your lungs with each breath. Taking long deep breaths may help reverse or decrease the chance of developing breathing (pulmonary) problems (especially infection) following:  A long period of time when you are unable to move or be active. BEFORE THE PROCEDURE  If the spirometer includes an indicator to show your best effort, your nurse or respiratory therapist will set it to a desired goal.  If possible, sit up straight or lean slightly forward. Try not to slouch.  Hold the incentive spirometer in an upright position. INSTRUCTIONS FOR USE   Sit on the edge of your bed if possible, or sit up as far as you can in bed or on a chair.  Hold the incentive spirometer in an upright position.  Breathe out normally.  Place the mouthpiece in your mouth and seal your lips tightly around it.  Breathe in slowly and as deeply as possible, raising the piston or the ball toward the top of the column.  Hold your breath for 3-5 seconds or for as long as possible. Allow the piston or ball to fall to the bottom of the column.  Remove the mouthpiece from your mouth and breathe out normally.  Rest for a few seconds and repeat Steps 1 through 7 at least 10 times every 1-2 hours when you are awake. Take your time and take a few  normal breaths between deep breaths.  The spirometer may include an indicator to show your best effort. Use the indicator as a goal to work toward during each repetition.  After each set of 10 deep breaths, practice coughing to be sure your lungs are clear. If you have an incision (the cut made at the time of surgery), support your incision when coughing by placing a pillow or rolled up towels firmly against it. Once you are able to get out of bed, walk around indoors and cough well. You may stop using the incentive spirometer when instructed by your caregiver.  RISKS AND COMPLICATIONS  Take your time so you do not get dizzy or light-headed.  If you are in pain, you may need to take or ask for pain medication before doing incentive spirometry. It is harder to take a deep breath if you are having pain. AFTER USE  Rest and breathe slowly and easily.  It can be helpful to keep track of a log of your progress. Your caregiver can provide you with a simple table to help with this. If you are using the spirometer at home, follow these instructions: [place] IF:   You are having difficultly using the spirometer.  You have trouble using the spirometer as often as instructed.  Your pain medication is not giving enough relief while using the spirometer.  You develop fever of 100.5 F (38.1 C) or higher. SEEK IMMEDIATE MEDICAL CARE IF:   You cough up bloody sputum that had not been present before.  You develop fever of 102 F (38.9 C) or greater.  You develop worsening pain at or near the incision site. MAKE SURE YOU:   Understand these instructions.  Will watch your condition.  Will get help right away if you are not doing well or get worse. Document Released: 11/25/2006 Document Revised: 10/07/2011 Document Reviewed: 01/26/2007 ExitCare Patient Information 2014 ExitCare, [place].   ________________________________________________________________________  WHAT IS A BLOOD  TRANSFUSION? Blood Transfusion Information  A transfusion is the replacement of blood or some of its parts. Blood is made up of multiple cells which provide different functions.  Red blood cells carry oxygen and are used for blood loss replacement.  White blood cells fight against infection.  Platelets control bleeding.  Plasma helps clot blood.  Other blood products are available for specialized needs, such as hemophilia or other clotting  disorders. BEFORE THE TRANSFUSION  Who gives blood for transfusions?   Healthy volunteers who are fully evaluated to make sure their blood is safe. This is blood bank blood. Transfusion therapy is the safest it has ever been in the practice of medicine. Before blood is taken from a donor, a complete history is taken to make sure that person has no history of diseases nor engages in risky social behavior (examples are intravenous drug use or sexual activity with multiple partners). The donor's travel history is screened to minimize risk of transmitting infections, such as malaria. The donated blood is tested for signs of infectious diseases, such as HIV and hepatitis. The blood is then tested to be sure it is compatible with you in order to minimize the chance of a transfusion reaction. If you or a relative donates blood, this is often done in anticipation of surgery and is not appropriate for emergency situations. It takes many days to process the donated blood. RISKS AND COMPLICATIONS Although transfusion therapy is very safe and saves many lives, the main dangers of transfusion include:   Getting an infectious disease.  Developing a transfusion reaction. This is an allergic reaction to something in the blood you were given. Every precaution is taken to prevent this. The decision to have a blood transfusion has been considered carefully by your caregiver before blood is given. Blood is not given unless the benefits outweigh the risks. AFTER THE  TRANSFUSION  Right after receiving a blood transfusion, you will usually feel much better and more energetic. This is especially true if your red blood cells have gotten low (anemic). The transfusion raises the level of the red blood cells which carry oxygen, and this usually causes an energy increase.  The nurse administering the transfusion will monitor you carefully for complications. HOME CARE INSTRUCTIONS  No special instructions are needed after a transfusion. You may find your energy is better. Speak with your caregiver about any limitations on activity for underlying diseases you may have. SEEK MEDICAL CARE IF:   Your condition is not improving after your transfusion.  You develop redness or irritation at the intravenous (IV) site. SEEK IMMEDIATE MEDICAL CARE IF:  Any of the following symptoms occur over the next 12 hours:  Shaking chills.  You have a temperature by mouth above 102 F (38.9 C), not controlled by medicine.  Chest, back, or muscle pain.  People around you feel you are not acting correctly or are confused.  Shortness of breath or difficulty breathing.  Dizziness and fainting.  You get a rash or develop hives.  You have a decrease in urine output.  Your urine turns a dark color or changes to pink, red, or brown. Any of the following symptoms occur over the next 10 days:  You have a temperature by mouth above 102 F (38.9 C), not controlled by medicine.  Shortness of breath.  Weakness after normal activity.  The white part of the eye turns yellow (jaundice).  You have a decrease in the amount of urine or are urinating less often.  Your urine turns a dark color or changes to pink, red, or brown. Document Released: 07/12/2000 Document Revised: 10/07/2011 Document Reviewed: 02/29/2008 [facility] Patient Information 2014 [place], [place].  _______________________________________________________________________

## 2015-05-29 ENCOUNTER — Encounter (HOSPITAL_COMMUNITY): Payer: Self-pay

## 2015-05-29 ENCOUNTER — Encounter (HOSPITAL_COMMUNITY)
Admission: RE | Admit: 2015-05-29 | Discharge: 2015-05-29 | Disposition: A | Discharge status: Home / Self Care | Payer: Medicare Other | Source: Ambulatory Visit | Attending: Orthopedic Surgery | Admitting: Orthopedic Surgery

## 2015-05-29 DIAGNOSIS — Z7901 Long term (current) use of anticoagulants: Secondary | ICD-10-CM | POA: Diagnosis not present

## 2015-05-29 DIAGNOSIS — Z01818 Encounter for other preprocedural examination: Secondary | ICD-10-CM | POA: Insufficient documentation

## 2015-05-29 DIAGNOSIS — M1612 Unilateral primary osteoarthritis, left hip: Secondary | ICD-10-CM | POA: Insufficient documentation

## 2015-05-29 HISTORY — DX: Gastro-esophageal reflux disease without esophagitis: K21.9

## 2015-05-29 LAB — COMPREHENSIVE METABOLIC PANEL
ALT: 39 U/L (ref 14–54)
ANION GAP: 8 (ref 5–15)
AST: 51 U/L — ABNORMAL HIGH (ref 15–41)
Albumin: 3.6 g/dL (ref 3.5–5.0)
Alkaline Phosphatase: 203 U/L — ABNORMAL HIGH (ref 38–126)
BUN: 17 mg/dL (ref 6–20)
CALCIUM: 9.3 mg/dL (ref 8.9–10.3)
CHLORIDE: 109 mmol/L (ref 101–111)
CO2: 22 mmol/L (ref 22–32)
CREATININE: 1.01 mg/dL — AB (ref 0.44–1.00)
GFR, EST NON AFRICAN AMERICAN: 58 mL/min — AB (ref >60)
Glucose, Bld: 177 mg/dL — ABNORMAL HIGH (ref 65–99)
Potassium: 4.2 mmol/L (ref 3.5–5.1)
SODIUM: 139 mmol/L (ref 135–145)
Total Bilirubin: 0.3 mg/dL (ref 0.3–1.2)
Total Protein: 7.4 g/dL (ref 6.5–8.1)

## 2015-05-29 LAB — ABO/RH: ABO/RH(D): A POS

## 2015-05-29 LAB — URINALYSIS, ROUTINE W REFLEX MICROSCOPIC
Bilirubin Urine: NEGATIVE
GLUCOSE, UA: NEGATIVE mg/dL
KETONES UR: NEGATIVE mg/dL
LEUKOCYTES UA: NEGATIVE
NITRITE: NEGATIVE
PH: 5.5 (ref 5.0–8.0)
Protein, ur: 30 mg/dL — AB
SPECIFIC GRAVITY, URINE: 1.019 (ref 1.005–1.030)
Urobilinogen, UA: 1 mg/dL (ref 0.0–1.0)

## 2015-05-29 LAB — SURGICAL PCR SCREEN
MRSA, PCR: NEGATIVE
STAPHYLOCOCCUS AUREUS: POSITIVE — AB

## 2015-05-29 LAB — CBC
HCT: 38.5 % (ref 36.0–46.0)
Hemoglobin: 12.6 g/dL (ref 12.0–15.0)
MCH: 26.3 pg (ref 26.0–34.0)
MCHC: 32.7 g/dL (ref 30.0–36.0)
MCV: 80.4 fL (ref 78.0–100.0)
PLATELETS: 305 10*3/uL (ref 150–400)
RBC: 4.79 MIL/uL (ref 3.87–5.11)
RDW: 13.8 % (ref 11.5–15.5)
WBC: 8.7 10*3/uL (ref 4.0–10.5)

## 2015-05-29 LAB — URINE MICROSCOPIC-ADD ON

## 2015-05-29 LAB — PROTIME-INR
INR: 1.14 (ref 0.00–1.49)
PROTHROMBIN TIME: 14.8 s (ref 11.6–15.2)

## 2015-05-29 LAB — APTT: aPTT: 39 seconds — ABNORMAL HIGH (ref 24–37)

## 2015-05-30 NOTE — Progress Notes (Signed)
Ptt / UA / PCR faxed to [place] called to [facility] notified

## 2015-06-06 ENCOUNTER — Ambulatory Visit: Payer: Self-pay | Admitting: Orthopedic Surgery

## 2015-06-06 NOTE — H&P (Signed)
Barbara Parks DOB: July 01, 1951 Single / Language: Barbara Parks / Race: White Female Date of Admission: 06/07/2015 CC:  Left Hip Pain History of Present Illness The patient is a 65 year old female who comes in  for a preoperative History and Physical. The patient is scheduled for a left total hip arthroplasty (anterior) to be performed by Dr. Dione Plover. Aluisio, MD at [facility] on 06-07-2015. The patient is a 64 year old female who presents today for follow up of their hip. The patient is being followed for their left hip pain, osteoarthritis and labral injury (labral tear dx 2008). They are 8 year(s) out. Symptoms reported include: pain, pain at night, aching, stiffness, catching, popping, grinding, giving way, pain with weightbearing, difficulty ambulating and difficulty arising from chair. and report their pain level to be 5 / 10 (to 7). The following medication has been used for pain control: Tylenol (prn). The patient presents today following CT scan (from Dr. Jana Hakim (Oncologist)- (L) hip). The patient has reported improvement of their symptoms with: rest. Barbara Parks comes in for evaluation of the left hip. She has had problems dating back to 2005 when she first noticed a problem while undergoing cheomtherapy for breast cancer. The hip has gotten progressively worse since 2008 with pain and decreasing mobility. The pain has gotten worse and she has required a cane for the past 4 years. She has noticed more difficulty with transferring in and out of her car, going up and down steps, and kneeling. She has developed some balance issues due to the hip now and finds that she is stumbling more and more. Pain is now radiating down into the knee. Pain is in the groing, back through into the buttock and down the thigh. She also has back pain with a prior history of a discetomy but no fusion. She has not tried any I-A hip injections and is not interested in this treatment. She has experienced  decrease ROM of her hip with more difficulty getting shoes and socks on. She uses Tylenol Arthrits sicne she could not tolerate the Advil. She has known arthitis in the hip and comes in today to dicuss surgical intervention. Unfortunately, her left hip has gotten progressively worse over time. It is at a point now where it is hurting her at all times. It is limiting what she can and cannot do. She has pain in her groin, radiating down her anterior thigh to her knee. She has lost a lot of motion. Right hip does not bother her. She is ready to proceed with surgery on the left hip. They have been treated conservatively in the past for the above stated problem and despite conservative measures, they continue to have progressive pain and severe functional limitations and dysfunction. They have failed non-operative management including home exercise, medications, and injections. It is felt that they would benefit from undergoing total joint replacement. Risks and benefits of the procedure have been discussed with the patient and they elect to proceed with surgery. There are no active contraindications to surgery such as ongoing infection or rapidly progressive neurological disease.  Problem List/Past Medical Primary osteoarthritis of left hip (M16.12) Chronic Pain Asthma Osteoarthritis Gastroesophageal Reflux Disease Breast Cancer Chondromalacia, patella (M22.40)10/29/2005 Degeneration, lumbar/lumbosacral disc (722.52)01/05/2007 Menopause Measles Mumps Scarlet Fever Childhood  Allergies Latex Exam Gloves *MEDICAL DEVICES AND SUPPLIES* ANTI-INFLAMMATORIES04/04/2006 GI buring and upset Aspirin (Salicylates) GI burning and upset  Family History Diabetes Mellitus mother and brother Cancer mother and grandfather fathers side Heart Disease  father Osteoarthritis sister Heart disease in female family member before age 67  Social History Drug/Alcohol Rehab (Currently)  no Alcohol use never consumed alcohol Children 0 Illicit drug use no Drug/Alcohol Rehab (Previously) no Exercise Exercises rarely Current work status working full time Marital status single Number of flights of stairs before winded less than 1 Pain Contract no Tobacco / smoke exposure no Living situation live alone Tobacco use former smoker; smoke(d) 1 pack(s) per day Advance Directives Living Will, [place] with Sister's help  Medication History Lopid (600MG  Tablet, Oral) Active. Letrozole (2.5MG  Tablet, Oral) Active. PriLOSEC OTC (20MG  Tablet DR, Oral) Active. Lopressor (Oral) Specific dose unknown - Active. Everlimous Active. (Cancer Study Drug)  Past Surgical History Mastectomy Date: 02/2004. right Breast Biopsy bilateral Spinal Surgery Date: 1997. Tonsillectomy Lymph Node Resection Date: 04/2004.   Review of Systems General Not Present- Chills, Fatigue, Fever, Memory Loss, Night Sweats, Weight Gain and Weight Loss. Skin Not Present- Eczema, Hives, Itching, Lesions and Rash. HEENT Present- Hearing Loss. Not Present- Dentures, Double Vision, Headache, Tinnitus and Visual Loss. Respiratory Present- Shortness of breath with exertion. Not Present- Allergies, Chronic Cough, Coughing up blood and Shortness of breath at rest. Cardiovascular Not Present- Chest Pain, Difficulty Breathing Lying Down, Murmur, Palpitations, Racing/skipping heartbeats and Swelling. Gastrointestinal Present- Diarrhea. Not Present- Abdominal Pain, Bloody Stool, Constipation, Difficulty Swallowing, Heartburn, Jaundice, Loss of appetitie, Nausea and Vomiting. Female Genitourinary Present- Urinating at Night. Not Present- Blood in Urine, Discharge, Flank Pain, Incontinence, Painful Urination, Urgency, Urinary frequency, Urinary Retention and Weak urinary stream. Musculoskeletal Present- Back Pain, Joint Pain and Morning Stiffness. Not Present- Joint  Swelling, Muscle Pain, Muscle Weakness and Spasms. Neurological Not Present- Blackout spells, Difficulty with balance, Dizziness, Paralysis, Tremor and Weakness. Psychiatric Not Present- Insomnia.  Vitals Weight: 169 lb Height: 65in Weight was reported by patient. Height was reported by patient. Body Surface Area: 1.84 m Body Mass Index: 28.12 kg/m  BP: 134/68 (Sitting, Right Arm, Standard)  Physical Exam General Mental Status -Alert, cooperative and good historian. General Appearance-pleasant, Not in acute distress. Orientation-Oriented X3. Build & Nutrition-Well nourished and Well developed.  Head and Neck Head-normocephalic, atraumatic . Neck Global Assessment - supple, no bruit auscultated on the right, no bruit auscultated on the left.  Eye Pupil - Bilateral-Regular and Round. Motion - Bilateral-EOMI.  Chest and Lung Exam Auscultation Breath sounds - clear at anterior chest wall and clear at posterior chest wall. Adventitious sounds - No Adventitious sounds.  Cardiovascular Auscultation Rhythm - Regular rate and rhythm. Heart Sounds - S1 WNL and S2 WNL. Murmurs & Other Heart Sounds - Auscultation of the heart reveals - No Murmurs.  Abdomen Palpation/Percussion Tenderness - Abdomen is non-tender to palpation. Rigidity (guarding) - Abdomen is soft. Auscultation Auscultation of the abdomen reveals - Bowel sounds normal.  Female Genitourinary Note: Not done, not pertinent to present illness   Musculoskeletal Note: Very pleasant, well developed female, alert and oriented, in no apparent distress. Her right hip has normal range of motion without discomfort. The left hip can be flexed to about 100, minimal internal rotation, no external rotation, and about 20 degrees of abduction. Knee exam is unremarkable. Pulse, sensation, and motor are intact.  RADIOGRAPHS Her radiographs AP pelvis and lateral of the left hip show that she has bone on bone  arthritis of the hip with subchondral cystic formation.  Assessment & Plan Primary osteoarthritis of left hip (M16.12) Note:Surgical Plans: Left Total Hip Replacement - Anterior Approach  Disposition: Home  PCP: Dr. Darcus Austin  Topical TXA - Breast Cancer  Anesthesia Issues: None  Comments: AVOID RIGHT ARM FOR IV'S AND BP'S  Signed electronically by Joelene Millin, III PA-C

## 2015-06-07 ENCOUNTER — Inpatient Hospital Stay (HOSPITAL_COMMUNITY): Payer: Medicare Other

## 2015-06-07 ENCOUNTER — Inpatient Hospital Stay (HOSPITAL_COMMUNITY): Payer: Medicare Other | Admitting: Certified Registered Nurse Anesthetist

## 2015-06-07 ENCOUNTER — Encounter (HOSPITAL_COMMUNITY): Payer: Self-pay | Admitting: *Deleted

## 2015-06-07 ENCOUNTER — Inpatient Hospital Stay (HOSPITAL_COMMUNITY)
Admission: RE | Admit: 2015-06-07 | Discharge: 2015-06-08 | DRG: 470 | Disposition: A | Discharge status: Home Health | Payer: Medicare Other | Source: Ambulatory Visit | Attending: Orthopedic Surgery | Admitting: Orthopedic Surgery

## 2015-06-07 ENCOUNTER — Encounter (HOSPITAL_COMMUNITY)
Admission: RE | Disposition: A | Discharge status: Home Health | Payer: Self-pay | Source: Ambulatory Visit | Attending: Orthopedic Surgery

## 2015-06-07 DIAGNOSIS — M25552 Pain in left hip: Secondary | ICD-10-CM

## 2015-06-07 DIAGNOSIS — K219 Gastro-esophageal reflux disease without esophagitis: Secondary | ICD-10-CM | POA: Diagnosis not present

## 2015-06-07 DIAGNOSIS — Z886 Allergy status to analgesic agent status: Secondary | ICD-10-CM | POA: Diagnosis not present

## 2015-06-07 DIAGNOSIS — I739 Peripheral vascular disease, unspecified: Secondary | ICD-10-CM | POA: Diagnosis not present

## 2015-06-07 DIAGNOSIS — Z01812 Encounter for preprocedural laboratory examination: Secondary | ICD-10-CM

## 2015-06-07 DIAGNOSIS — Z79899 Other long term (current) drug therapy: Secondary | ICD-10-CM

## 2015-06-07 DIAGNOSIS — Z96649 Presence of unspecified artificial hip joint: Secondary | ICD-10-CM

## 2015-06-07 DIAGNOSIS — Z471 Aftercare following joint replacement surgery: Secondary | ICD-10-CM | POA: Diagnosis not present

## 2015-06-07 DIAGNOSIS — M8580 Other specified disorders of bone density and structure, unspecified site: Secondary | ICD-10-CM | POA: Diagnosis present

## 2015-06-07 DIAGNOSIS — M169 Osteoarthritis of hip, unspecified: Secondary | ICD-10-CM | POA: Diagnosis not present

## 2015-06-07 DIAGNOSIS — Z87891 Personal history of nicotine dependence: Secondary | ICD-10-CM

## 2015-06-07 DIAGNOSIS — Z96641 Presence of right artificial hip joint: Secondary | ICD-10-CM | POA: Diagnosis not present

## 2015-06-07 DIAGNOSIS — E7800 Pure hypercholesterolemia, unspecified: Secondary | ICD-10-CM | POA: Diagnosis present

## 2015-06-07 DIAGNOSIS — Z853 Personal history of malignant neoplasm of breast: Secondary | ICD-10-CM

## 2015-06-07 DIAGNOSIS — I10 Essential (primary) hypertension: Secondary | ICD-10-CM | POA: Diagnosis not present

## 2015-06-07 DIAGNOSIS — M1612 Unilateral primary osteoarthritis, left hip: Principal | ICD-10-CM | POA: Diagnosis present

## 2015-06-07 HISTORY — PX: TOTAL HIP ARTHROPLASTY: SHX124

## 2015-06-07 LAB — TYPE AND SCREEN
ABO/RH(D): A POS
Antibody Screen: NEGATIVE

## 2015-06-07 SURGERY — ARTHROPLASTY, HIP, TOTAL, ANTERIOR APPROACH
Anesthesia: General | Site: Hip | Laterality: Left

## 2015-06-07 MED ORDER — BUPIVACAINE HCL (PF) 0.25 % IJ SOLN
INTRAMUSCULAR | Status: AC
  Filled 2015-06-07: qty 30

## 2015-06-07 MED ORDER — ONDANSETRON HCL 4 MG/2ML IJ SOLN
INTRAMUSCULAR | Status: AC
  Filled 2015-06-07: qty 2

## 2015-06-07 MED ORDER — CEFAZOLIN SODIUM-DEXTROSE 2-3 GM-% IV SOLR
2.0000 g | INTRAVENOUS | Status: AC
  Administered 2015-06-07: 2 g via INTRAVENOUS

## 2015-06-07 MED ORDER — DOCUSATE SODIUM 100 MG PO CAPS
100.0000 mg | ORAL_CAPSULE | Freq: Two times a day (BID) | ORAL | Status: DC
  Administered 2015-06-07 – 2015-06-08 (×2): 100 mg via ORAL

## 2015-06-07 MED ORDER — PROCHLORPERAZINE MALEATE 10 MG PO TABS
10.0000 mg | ORAL_TABLET | Freq: Four times a day (QID) | ORAL | Status: DC | PRN
  Filled 2015-06-07: qty 1

## 2015-06-07 MED ORDER — ACETAMINOPHEN 10 MG/ML IV SOLN
1000.0000 mg | Freq: Once | INTRAVENOUS | Status: AC
  Administered 2015-06-07: 1000 mg via INTRAVENOUS

## 2015-06-07 MED ORDER — PHENYLEPHRINE 40 MCG/ML (10ML) SYRINGE FOR IV PUSH (FOR BLOOD PRESSURE SUPPORT)
PREFILLED_SYRINGE | INTRAVENOUS | Status: AC
  Filled 2015-06-07: qty 10

## 2015-06-07 MED ORDER — SODIUM CHLORIDE 0.9 % IV SOLN: INTRAVENOUS | Status: DC

## 2015-06-07 MED ORDER — FENTANYL CITRATE (PF) 100 MCG/2ML IJ SOLN
INTRAMUSCULAR | Status: DC | PRN
  Administered 2015-06-07 (×3): 100 ug via INTRAVENOUS
  Administered 2015-06-07: 50 ug via INTRAVENOUS

## 2015-06-07 MED ORDER — BISACODYL 10 MG RE SUPP: 10.0000 mg | Freq: Every day | RECTAL | Status: DC | PRN

## 2015-06-07 MED ORDER — DEXAMETHASONE SODIUM PHOSPHATE 10 MG/ML IJ SOLN
INTRAMUSCULAR | Status: AC
Start: 2015-06-07 — End: 2015-06-07
  Filled 2015-06-07: qty 1

## 2015-06-07 MED ORDER — PHENOL 1.4 % MT LIQD: 1.0000 | OROMUCOSAL | Status: DC | PRN

## 2015-06-07 MED ORDER — MIDAZOLAM HCL 2 MG/2ML IJ SOLN
INTRAMUSCULAR | Status: AC
  Filled 2015-06-07: qty 4

## 2015-06-07 MED ORDER — PROPOFOL 10 MG/ML IV BOLUS
INTRAVENOUS | Status: AC
  Filled 2015-06-07: qty 20

## 2015-06-07 MED ORDER — LACTATED RINGERS IV SOLN
INTRAVENOUS | Status: DC
  Administered 2015-06-07: 17:00:00 via INTRAVENOUS
  Administered 2015-06-07: 1000 mL via INTRAVENOUS

## 2015-06-07 MED ORDER — HYDROMORPHONE HCL 1 MG/ML IJ SOLN: 0.2500 mg | INTRAMUSCULAR | Status: DC | PRN

## 2015-06-07 MED ORDER — DIPHENHYDRAMINE HCL 12.5 MG/5ML PO ELIX: 12.5000 mg | ORAL_SOLUTION | ORAL | Status: DC | PRN

## 2015-06-07 MED ORDER — ONDANSETRON HCL 4 MG/2ML IJ SOLN
4.0000 mg | Freq: Four times a day (QID) | INTRAMUSCULAR | Status: DC | PRN
  Administered 2015-06-07: 4 mg via INTRAVENOUS

## 2015-06-07 MED ORDER — TRANEXAMIC ACID 1000 MG/10ML IV SOLN
2000.0000 mg | Freq: Once | INTRAVENOUS | Status: DC
  Filled 2015-06-07: qty 20

## 2015-06-07 MED ORDER — HYDROMORPHONE HCL 2 MG/ML IJ SOLN
INTRAMUSCULAR | Status: AC
  Filled 2015-06-07: qty 1

## 2015-06-07 MED ORDER — BUPIVACAINE HCL (PF) 0.25 % IJ SOLN
INTRAMUSCULAR | Status: DC | PRN
Start: 2015-06-07 — End: 2015-06-07
  Administered 2015-06-07: 30 mL

## 2015-06-07 MED ORDER — LABETALOL HCL 5 MG/ML IV SOLN
INTRAVENOUS | Status: AC
  Filled 2015-06-07: qty 4

## 2015-06-07 MED ORDER — LOPERAMIDE HCL 2 MG PO CAPS: 2.0000 mg | ORAL_CAPSULE | Freq: Four times a day (QID) | ORAL | Status: DC | PRN

## 2015-06-07 MED ORDER — OXYCODONE HCL 5 MG PO TABS: 5.0000 mg | ORAL_TABLET | Freq: Once | ORAL | Status: DC | PRN

## 2015-06-07 MED ORDER — GEMFIBROZIL 600 MG PO TABS
600.0000 mg | ORAL_TABLET | Freq: Two times a day (BID) | ORAL | Status: DC
  Administered 2015-06-08: 600 mg via ORAL
  Filled 2015-06-07 (×5): qty 1

## 2015-06-07 MED ORDER — PHENYLEPHRINE HCL 10 MG/ML IJ SOLN
INTRAMUSCULAR | Status: DC | PRN
  Administered 2015-06-07: 40 ug via INTRAVENOUS

## 2015-06-07 MED ORDER — OXYCODONE HCL 5 MG PO TABS
5.0000 mg | ORAL_TABLET | ORAL | Status: DC | PRN
  Administered 2015-06-07 – 2015-06-08 (×6): 5 mg via ORAL
  Filled 2015-06-07 (×3): qty 1
  Filled 2015-06-07: qty 2
  Filled 2015-06-07 (×2): qty 1

## 2015-06-07 MED ORDER — ACETAMINOPHEN 650 MG RE SUPP: 650.0000 mg | Freq: Four times a day (QID) | RECTAL | Status: DC | PRN

## 2015-06-07 MED ORDER — RIVAROXABAN 10 MG PO TABS
10.0000 mg | ORAL_TABLET | Freq: Every day | ORAL | Status: DC
  Administered 2015-06-08: 10 mg via ORAL
  Filled 2015-06-07 (×3): qty 1

## 2015-06-07 MED ORDER — CHLORHEXIDINE GLUCONATE 4 % EX LIQD: 60.0000 mL | Freq: Once | CUTANEOUS | Status: DC

## 2015-06-07 MED ORDER — BUPIVACAINE HCL (PF) 0.5 % IJ SOLN
INTRAMUSCULAR | Status: AC
  Filled 2015-06-07: qty 30

## 2015-06-07 MED ORDER — INV-EVEROLIMUS (RAD0001) 5MG TABLET NOVARTIS CRAD001Y24135: 5.0000 mg | ORAL_TABLET | Freq: Every day | ORAL | Status: DC

## 2015-06-07 MED ORDER — POLYETHYLENE GLYCOL 3350 17 G PO PACK: 17.0000 g | PACK | Freq: Every day | ORAL | Status: DC | PRN

## 2015-06-07 MED ORDER — ACETAMINOPHEN 500 MG PO TABS
1000.0000 mg | ORAL_TABLET | Freq: Four times a day (QID) | ORAL | Status: DC
  Administered 2015-06-07 – 2015-06-08 (×3): 1000 mg via ORAL
  Filled 2015-06-07 (×4): qty 2

## 2015-06-07 MED ORDER — METHOCARBAMOL 500 MG PO TABS
500.0000 mg | ORAL_TABLET | Freq: Four times a day (QID) | ORAL | Status: DC | PRN
  Administered 2015-06-08: 500 mg via ORAL
  Filled 2015-06-07 (×2): qty 1

## 2015-06-07 MED ORDER — ROCURONIUM BROMIDE 100 MG/10ML IV SOLN
INTRAVENOUS | Status: AC
Start: 2015-06-07 — End: 2015-06-07
  Filled 2015-06-07: qty 1

## 2015-06-07 MED ORDER — METOPROLOL TARTRATE 12.5 MG HALF TABLET
12.5000 mg | ORAL_TABLET | Freq: Every day | ORAL | Status: DC
  Administered 2015-06-08: 12.5 mg via ORAL
  Filled 2015-06-07 (×2): qty 1

## 2015-06-07 MED ORDER — HYDROMORPHONE HCL 1 MG/ML IJ SOLN
INTRAMUSCULAR | Status: DC | PRN
  Administered 2015-06-07 (×2): 1 mg via INTRAVENOUS

## 2015-06-07 MED ORDER — CEFAZOLIN SODIUM-DEXTROSE 2-3 GM-% IV SOLR
INTRAVENOUS | Status: AC
  Filled 2015-06-07: qty 50

## 2015-06-07 MED ORDER — PROMETHAZINE HCL 25 MG/ML IJ SOLN: 6.2500 mg | INTRAMUSCULAR | Status: DC | PRN

## 2015-06-07 MED ORDER — ONDANSETRON HCL 4 MG PO TABS: 4.0000 mg | ORAL_TABLET | Freq: Four times a day (QID) | ORAL | Status: DC | PRN

## 2015-06-07 MED ORDER — LIDOCAINE HCL (CARDIAC) 20 MG/ML IV SOLN
INTRAVENOUS | Status: AC
  Filled 2015-06-07: qty 5

## 2015-06-07 MED ORDER — METOCLOPRAMIDE HCL 10 MG PO TABS: 5.0000 mg | ORAL_TABLET | Freq: Three times a day (TID) | ORAL | Status: DC | PRN

## 2015-06-07 MED ORDER — ACETAMINOPHEN 325 MG PO TABS: 650.0000 mg | ORAL_TABLET | Freq: Four times a day (QID) | ORAL | Status: DC | PRN

## 2015-06-07 MED ORDER — LIDOCAINE HCL (CARDIAC) 20 MG/ML IV SOLN
INTRAVENOUS | Status: DC | PRN
  Administered 2015-06-07: 100 mg via INTRAVENOUS

## 2015-06-07 MED ORDER — SUGAMMADEX SODIUM 200 MG/2ML IV SOLN
INTRAVENOUS | Status: DC | PRN
  Administered 2015-06-07: 200 mg via INTRAVENOUS

## 2015-06-07 MED ORDER — MENTHOL 3 MG MT LOZG: 1.0000 | LOZENGE | OROMUCOSAL | Status: DC | PRN

## 2015-06-07 MED ORDER — SODIUM CHLORIDE 0.9 % IV SOLN
INTRAVENOUS | Status: DC
  Administered 2015-06-07: 21:00:00 via INTRAVENOUS

## 2015-06-07 MED ORDER — LETROZOLE 2.5 MG PO TABS
2.5000 mg | ORAL_TABLET | Freq: Every day | ORAL | Status: DC
  Filled 2015-06-07 (×3): qty 1

## 2015-06-07 MED ORDER — FLEET ENEMA 7-19 GM/118ML RE ENEM: 1.0000 | ENEMA | Freq: Once | RECTAL | Status: DC | PRN

## 2015-06-07 MED ORDER — TRANEXAMIC ACID 1000 MG/10ML IV SOLN
2000.0000 mg | INTRAVENOUS | Status: DC | PRN
  Administered 2015-06-07: 2000 mg via TOPICAL

## 2015-06-07 MED ORDER — PANTOPRAZOLE SODIUM 40 MG PO TBEC
80.0000 mg | DELAYED_RELEASE_TABLET | Freq: Every day | ORAL | Status: DC
  Administered 2015-06-08: 80 mg via ORAL
  Filled 2015-06-07 (×2): qty 2

## 2015-06-07 MED ORDER — ROCURONIUM BROMIDE 100 MG/10ML IV SOLN
INTRAVENOUS | Status: DC | PRN
  Administered 2015-06-07: 50 mg via INTRAVENOUS

## 2015-06-07 MED ORDER — PROPOFOL 10 MG/ML IV BOLUS
INTRAVENOUS | Status: DC | PRN
  Administered 2015-06-07: 200 mg via INTRAVENOUS

## 2015-06-07 MED ORDER — MIDAZOLAM HCL 5 MG/5ML IJ SOLN
INTRAMUSCULAR | Status: DC | PRN
  Administered 2015-06-07: 2 mg via INTRAVENOUS

## 2015-06-07 MED ORDER — CEFAZOLIN SODIUM-DEXTROSE 2-3 GM-% IV SOLR
2.0000 g | Freq: Four times a day (QID) | INTRAVENOUS | Status: AC
  Administered 2015-06-07 – 2015-06-08 (×2): 2 g via INTRAVENOUS
  Filled 2015-06-07 (×2): qty 50

## 2015-06-07 MED ORDER — LABETALOL HCL 5 MG/ML IV SOLN
INTRAVENOUS | Status: DC | PRN
  Administered 2015-06-07 (×2): 2.5 mg via INTRAVENOUS

## 2015-06-07 MED ORDER — SUGAMMADEX SODIUM 200 MG/2ML IV SOLN
INTRAVENOUS | Status: AC
  Filled 2015-06-07: qty 2

## 2015-06-07 MED ORDER — ONDANSETRON HCL 4 MG/2ML IJ SOLN
INTRAMUSCULAR | Status: DC | PRN
  Administered 2015-06-07: 4 mg via INTRAVENOUS

## 2015-06-07 MED ORDER — DEXAMETHASONE SODIUM PHOSPHATE 10 MG/ML IJ SOLN
10.0000 mg | Freq: Once | INTRAMUSCULAR | Status: AC
  Administered 2015-06-07: 10 mg via INTRAVENOUS

## 2015-06-07 MED ORDER — TRAMADOL HCL 50 MG PO TABS
50.0000 mg | ORAL_TABLET | Freq: Four times a day (QID) | ORAL | Status: DC | PRN
Start: 2015-06-07 — End: 2015-06-08

## 2015-06-07 MED ORDER — DEXAMETHASONE SODIUM PHOSPHATE 10 MG/ML IJ SOLN
10.0000 mg | Freq: Once | INTRAMUSCULAR | Status: AC
  Administered 2015-06-08: 10 mg via INTRAVENOUS
  Filled 2015-06-07: qty 1

## 2015-06-07 MED ORDER — MORPHINE SULFATE (PF) 2 MG/ML IV SOLN
1.0000 mg | INTRAVENOUS | Status: DC | PRN
Start: 2015-06-07 — End: 2015-06-08
  Administered 2015-06-07: 1 mg via INTRAVENOUS
  Filled 2015-06-07: qty 1

## 2015-06-07 MED ORDER — OXYCODONE HCL 5 MG/5ML PO SOLN
5.0000 mg | Freq: Once | ORAL | Status: DC | PRN
  Filled 2015-06-07: qty 5

## 2015-06-07 MED ORDER — INV-EVEROLIMUS (RAD0001) 5MG TABLET NOVARTIS CRAD001Y24135: 10.0000 mg | ORAL_TABLET | Freq: Every day | ORAL | Status: DC

## 2015-06-07 MED ORDER — ONDANSETRON HCL 4 MG/2ML IJ SOLN
INTRAMUSCULAR | Status: AC
Start: 2015-06-07 — End: 2015-06-07
  Filled 2015-06-07: qty 2

## 2015-06-07 MED ORDER — METHOCARBAMOL 1000 MG/10ML IJ SOLN
500.0000 mg | Freq: Four times a day (QID) | INTRAVENOUS | Status: DC | PRN
  Filled 2015-06-07: qty 5

## 2015-06-07 MED ORDER — FENTANYL CITRATE (PF) 100 MCG/2ML IJ SOLN
INTRAMUSCULAR | Status: AC
  Filled 2015-06-07: qty 4

## 2015-06-07 MED ORDER — ACETAMINOPHEN 10 MG/ML IV SOLN
INTRAVENOUS | Status: AC
  Filled 2015-06-07: qty 100

## 2015-06-07 MED ORDER — FENTANYL CITRATE (PF) 250 MCG/5ML IJ SOLN
INTRAMUSCULAR | Status: AC
  Filled 2015-06-07: qty 25

## 2015-06-07 MED ORDER — METOCLOPRAMIDE HCL 5 MG/ML IJ SOLN
5.0000 mg | Freq: Three times a day (TID) | INTRAMUSCULAR | Status: DC | PRN
Start: 2015-06-07 — End: 2015-06-08

## 2015-06-07 SURGICAL SUPPLY — 38 items
BAG DECANTER FOR FLEXI CONT (MISCELLANEOUS) ×3 IMPLANT
BAG ZIPLOCK 12X15 (MISCELLANEOUS) IMPLANT
BLADE SAG 18X100X1.27 (BLADE) ×3 IMPLANT
CAPT HIP TOTAL 2 ×3 IMPLANT
CLOSURE WOUND 1/2 X4 (GAUZE/BANDAGES/DRESSINGS) ×1
CLOTH BEACON ORANGE TIMEOUT ST (SAFETY) ×3 IMPLANT
COVER PERINEAL POST (MISCELLANEOUS) ×3 IMPLANT
DECANTER SPIKE VIAL GLASS SM (MISCELLANEOUS) ×3 IMPLANT
DRAPE STERI IOBAN 125X83 (DRAPES) ×3 IMPLANT
DRAPE U-SHAPE 47X51 STRL (DRAPES) ×6 IMPLANT
DRSG ADAPTIC 3X8 NADH LF (GAUZE/BANDAGES/DRESSINGS) ×3 IMPLANT
DRSG MEPILEX BORDER 4X4 (GAUZE/BANDAGES/DRESSINGS) ×3 IMPLANT
DRSG MEPILEX BORDER 4X8 (GAUZE/BANDAGES/DRESSINGS) ×3 IMPLANT
DURAPREP 26ML APPLICATOR (WOUND CARE) ×3 IMPLANT
ELECT REM PT RETURN 9FT ADLT (ELECTROSURGICAL) ×3
ELECTRODE REM PT RTRN 9FT ADLT (ELECTROSURGICAL) ×1 IMPLANT
EVACUATOR 1/8 PVC DRAIN (DRAIN) ×3 IMPLANT
GLOVE BIO SURGEON STRL SZ7.5 (GLOVE) IMPLANT
GLOVE BIO SURGEON STRL SZ8 (GLOVE) IMPLANT
GLOVE BIOGEL PI IND STRL 8 (GLOVE) ×2 IMPLANT
GLOVE BIOGEL PI INDICATOR 8 (GLOVE) ×4
GLOVE SURG SS PI 8.0 STRL IVOR (GLOVE) ×6 IMPLANT
GOWN STRL REUS W/TWL LRG LVL3 (GOWN DISPOSABLE) ×3 IMPLANT
GOWN STRL REUS W/TWL XL LVL3 (GOWN DISPOSABLE) ×3 IMPLANT
NDL SAFETY ECLIPSE 18X1.5 (NEEDLE) ×1 IMPLANT
NEEDLE HYPO 18GX1.5 SHARP (NEEDLE) ×2
PACK ANTERIOR HIP CUSTOM (KITS) ×3 IMPLANT
STRIP CLOSURE SKIN 1/2X4 (GAUZE/BANDAGES/DRESSINGS) ×2 IMPLANT
SUT ETHIBOND NAB CT1 #1 30IN (SUTURE) ×3 IMPLANT
SUT MNCRL AB 4-0 PS2 18 (SUTURE) ×3 IMPLANT
SUT VIC AB 2-0 CT1 27 (SUTURE) ×6
SUT VIC AB 2-0 CT1 TAPERPNT 27 (SUTURE) ×3 IMPLANT
SUT VLOC 180 0 24IN GS25 (SUTURE) ×3 IMPLANT
SYR 30ML LL (SYRINGE) ×3 IMPLANT
SYR 50ML LL SCALE MARK (SYRINGE) ×3 IMPLANT
TRAY FOLEY W/METER SILVER 14FR (SET/KITS/TRAYS/PACK) IMPLANT
TRAY FOLEY W/METER SILVER 16FR (SET/KITS/TRAYS/PACK) IMPLANT
YANKAUER SUCT BULB TIP 10FT TU (MISCELLANEOUS) ×3 IMPLANT

## 2015-06-07 NOTE — Transfer of Care (Signed)
Immediate Anesthesia Transfer of Care Note  Patient: Barbara Parks  Procedure(s) Performed: Procedure(s): LEFT TOTAL HIP ARTHROPLASTY ANTERIOR APPROACH (Left)  Patient Location: PACU  Anesthesia Type:General  Level of Consciousness: awake, alert  and oriented  Airway & Oxygen Therapy: Patient Spontanous Breathing and Patient connected to face mask oxygen  Post-op Assessment: Report given to RN and Post -op Vital signs reviewed and stable  Post vital signs: Reviewed and stable  Last Vitals:  Filed Vitals:   06/07/15 1319  BP: 145/76  Pulse: 92  Temp: 36.7 C  Resp: 18    Complications: No apparent anesthesia complications

## 2015-06-07 NOTE — Op Note (Signed)
OPERATIVE REPORT  PREOPERATIVE DIAGNOSIS: Osteoarthritis of the Left hip.   POSTOPERATIVE DIAGNOSIS: Osteoarthritis of the Left  hip.   PROCEDURE: Left total hip arthroplasty, anterior approach.   SURGEON: Gaynelle Arabian, MD   ASSISTANT: Arlee Muslim, PA-C  ANESTHESIA:  General  ESTIMATED BLOOD LOSS:-300 ml   DRAINS: Hemovac x1.   COMPLICATIONS: None   CONDITION: PACU - hemodynamically stable.   BRIEF CLINICAL NOTE: Barbara Parks is a 64 y.o. female who has advanced end-  stage arthritis of her Left  hip with progressively worsening pain and  dysfunction.The patient has failed nonoperative management and presents for  total hip arthroplasty.   PROCEDURE IN DETAIL: After successful administration of spinal  anesthetic, the traction boots for the [facility] bed were placed on both  feet and the patient was placed onto the [facility] bed, boots placed into the leg  holders. The Left hip was then isolated from the perineum with plastic  drapes and prepped and draped in the usual sterile fashion. ASIS and  greater trochanter were marked and a oblique incision was made, starting  at about 1 cm lateral and 2 cm distal to the ASIS and coursing towards  the anterior cortex of the femur. The skin was cut with a 10 blade  through subcutaneous tissue to the level of the fascia overlying the  tensor fascia lata muscle. The fascia was then incised in line with the  incision at the junction of the anterior third and posterior 2/3rd. The  muscle was teased off the fascia and then the interval between the TFL  and the rectus was developed. The Hohmann retractor was then placed at  the top of the femoral neck over the capsule. The vessels overlying the  capsule were cauterized and the fat on top of the capsule was removed.  A Hohmann retractor was then placed anterior underneath the rectus  femoris to give exposure to the entire anterior capsule. A T-shaped  capsulotomy was performed. The  edges were tagged and the femoral head  was identified.       Osteophytes are removed off the superior acetabulum.  The femoral neck was then cut in situ with an oscillating saw. Traction  was then applied to the left lower extremity utilizing the [facility]  traction. The femoral head was then removed. Retractors were placed  around the acetabulum and then circumferential removal of the labrum was  performed. Osteophytes were also removed. Reaming starts at 45 mm to  medialize and  Increased in 2 mm increments to 49 mm. We reamed in  approximately 40 degrees of abduction, 20 degrees anteversion. A 50 mm  pinnacle acetabular shell was then impacted in anatomic position under  fluoroscopic guidance with excellent purchase. We did not need to place  any additional dome screws. A 32 mm neutral + 4 marathon liner was then  placed into the acetabular shell.       The femoral lift was then placed along the lateral aspect of the femur  just distal to the vastus ridge. The leg was  externally rotated and capsule  was stripped off the inferior aspect of the femoral neck down to the  level of the lesser trochanter, this was done with electrocautery. The femur was lifted after this was performed. The  leg was then placed and extended in adducted position to essentially delivering the femur. We also removed the capsule superiorly and the  piriformis from the piriformis  fossa to gain excellent exposure of the  proximal femur. Rongeur was used to remove some cancellous bone to get  into the lateral portion of the proximal femur for placement of the  initial starter reamer. The starter broaches was placed  the starter broach  and was shown to go down the center of the canal. Broaching  with the  Corail system was then performed starting at size 8, coursing  Up to size 11. A size 11 had excellent torsional and rotational  and axial stability. The trial standard offset neck was then placed  with a 32 + 1 trial  head. The hip was then reduced. We confirmed that  the stem was in the canal both on AP and lateral x-rays. It also has excellent sizing. The hip was reduced with outstanding stability through full extension, full external rotation,  and then flexion in adduction internal rotation. AP pelvis was taken  and the leg lengths were measured and found to be exactly equal. Hip  was then dislocated again and the femoral head and neck removed. The  femoral broach was removed. Size 11 Corail stem with a standard offset  neck was then impacted into the femur following native anteversion. Has  excellent purchase in the canal. Excellent torsional and rotational and  axial stability. It is confirmed to be in the canal on AP and lateral  fluoroscopic views. The 32 + 1 ceramic head was placed and the hip  reduced with outstanding stability. Again AP pelvis was taken and it  confirmed that the leg lengths were equal. The wound was then copiously  irrigated with saline solution and the capsule reattached and repaired  with Ethibond suture. 30 ml of .25% Bupivicaine injected into the capsule and into the edge of the tensor fascia lata as well as subcutaneous tissue. The fascia overlying the tensor fascia lata was  then closed with a running #1 V-Loc. Subcu was closed with interrupted  2-0 Vicryl and subcuticular running 4-0 Monocryl. Incision was cleaned  and dried. Steri-Strips and a bulky sterile dressing applied. Hemovac  drain was hooked to suction and then he was awakened and transported to  recovery in stable condition.        Please note that a surgical assistant was a medical necessity for this procedure to perform it in a safe and expeditious manner. Assistant was necessary to provide appropriate retraction of vital neurovascular structures and to prevent femoral fracture and allow for anatomic placement of the prosthesis.  Gaynelle Arabian, M.D.

## 2015-06-07 NOTE — Interval H&P Note (Signed)
History and Physical Interval Note:  06/07/2015 3:47 PM  Barbara Parks  has presented today for surgery, with the diagnosis of OA LEFT HIP  The various methods of treatment have been discussed with the patient and family. After consideration of risks, benefits and other options for treatment, the patient has consented to  Procedure(s): LEFT TOTAL HIP ARTHROPLASTY ANTERIOR APPROACH (Left) as a surgical intervention .  The patient's history has been reviewed, patient examined, no change in status, stable for surgery.  I have reviewed the patient's chart and labs.  Questions were answered to the patient's satisfaction.     Gearlean Alf

## 2015-06-07 NOTE — H&P (View-Only) (Signed)
Barbara Parks DOB: Aug 30, 1950 Single / Language: Cleophus Molt / Race: White Female Date of Admission: 06/07/2015 CC:  Left Hip Pain History of Present Illness The patient is a 64 year old female who comes in  for a preoperative History and Physical. The patient is scheduled for a left total hip arthroplasty (anterior) to be performed by Dr. Dione Plover. Aluisio, MD at [facility] on 06-07-2015. The patient is a 64 year old female who presents today for follow up of their hip. The patient is being followed for their left hip pain, osteoarthritis and labral injury (labral tear dx 2008). They are 8 year(s) out. Symptoms reported include: pain, pain at night, aching, stiffness, catching, popping, grinding, giving way, pain with weightbearing, difficulty ambulating and difficulty arising from chair. and report their pain level to be 5 / 10 (to 7). The following medication has been used for pain control: Tylenol (prn). The patient presents today following CT scan (from Dr. Jana Hakim (Oncologist)- (L) hip). The patient has reported improvement of their symptoms with: rest. Barbara Parks comes in for evaluation of the left hip. She has had problems dating back to 2005 when she first noticed a problem while undergoing cheomtherapy for breast cancer. The hip has gotten progressively worse since 2008 with pain and decreasing mobility. The pain has gotten worse and she has required a cane for the past 4 years. She has noticed more difficulty with transferring in and out of her car, going up and down steps, and kneeling. She has developed some balance issues due to the hip now and finds that she is stumbling more and more. Pain is now radiating down into the knee. Pain is in the groing, back through into the buttock and down the thigh. She also has back pain with a prior history of a discetomy but no fusion. She has not tried any I-A hip injections and is not interested in this treatment. She has experienced  decrease ROM of her hip with more difficulty getting shoes and socks on. She uses Tylenol Arthrits sicne she could not tolerate the Advil. She has known arthitis in the hip and comes in today to dicuss surgical intervention. Unfortunately, her left hip has gotten progressively worse over time. It is at a point now where it is hurting her at all times. It is limiting what she can and cannot do. She has pain in her groin, radiating down her anterior thigh to her knee. She has lost a lot of motion. Right hip does not bother her. She is ready to proceed with surgery on the left hip. They have been treated conservatively in the past for the above stated problem and despite conservative measures, they continue to have progressive pain and severe functional limitations and dysfunction. They have failed non-operative management including home exercise, medications, and injections. It is felt that they would benefit from undergoing total joint replacement. Risks and benefits of the procedure have been discussed with the patient and they elect to proceed with surgery. There are no active contraindications to surgery such as ongoing infection or rapidly progressive neurological disease.  Problem List/Past Medical Primary osteoarthritis of left hip (M16.12) Chronic Pain Asthma Osteoarthritis Gastroesophageal Reflux Disease Breast Cancer Chondromalacia, patella (M22.40)10/29/2005 Degeneration, lumbar/lumbosacral disc (722.52)01/05/2007 Menopause Measles Mumps Scarlet Fever Childhood  Allergies Latex Exam Gloves *MEDICAL DEVICES AND SUPPLIES* ANTI-INFLAMMATORIES04/04/2006 GI buring and upset Aspirin (Salicylates) GI burning and upset  Family History Diabetes Mellitus mother and brother Cancer mother and grandfather fathers side Heart Disease  father Osteoarthritis sister Heart disease in female family member before age 39  Social History Drug/Alcohol Rehab (Currently)  no Alcohol use never consumed alcohol Children 0 Illicit drug use no Drug/Alcohol Rehab (Previously) no Exercise Exercises rarely Current work status working full time Marital status single Number of flights of stairs before winded less than 1 Pain Contract no Tobacco / smoke exposure no Living situation live alone Tobacco use former smoker; smoke(d) 1 pack(s) per day Advance Directives Living Will, [place] with Sister's help  Medication History Lopid (600MG  Tablet, Oral) Active. Letrozole (2.5MG  Tablet, Oral) Active. PriLOSEC OTC (20MG  Tablet DR, Oral) Active. Lopressor (Oral) Specific dose unknown - Active. Everlimous Active. (Cancer Study Drug)  Past Surgical History Mastectomy Date: 02/2004. right Breast Biopsy bilateral Spinal Surgery Date: 1997. Tonsillectomy Lymph Node Resection Date: 04/2004.   Review of Systems General Not Present- Chills, Fatigue, Fever, Memory Loss, Night Sweats, Weight Gain and Weight Loss. Skin Not Present- Eczema, Hives, Itching, Lesions and Rash. HEENT Present- Hearing Loss. Not Present- Dentures, Double Vision, Headache, Tinnitus and Visual Loss. Respiratory Present- Shortness of breath with exertion. Not Present- Allergies, Chronic Cough, Coughing up blood and Shortness of breath at rest. Cardiovascular Not Present- Chest Pain, Difficulty Breathing Lying Down, Murmur, Palpitations, Racing/skipping heartbeats and Swelling. Gastrointestinal Present- Diarrhea. Not Present- Abdominal Pain, Bloody Stool, Constipation, Difficulty Swallowing, Heartburn, Jaundice, Loss of appetitie, Nausea and Vomiting. Female Genitourinary Present- Urinating at Night. Not Present- Blood in Urine, Discharge, Flank Pain, Incontinence, Painful Urination, Urgency, Urinary frequency, Urinary Retention and Weak urinary stream. Musculoskeletal Present- Back Pain, Joint Pain and Morning Stiffness. Not Present- Joint  Swelling, Muscle Pain, Muscle Weakness and Spasms. Neurological Not Present- Blackout spells, Difficulty with balance, Dizziness, Paralysis, Tremor and Weakness. Psychiatric Not Present- Insomnia.  Vitals Weight: 169 lb Height: 65in Weight was reported by patient. Height was reported by patient. Body Surface Area: 1.84 m Body Mass Index: 28.12 kg/m  BP: 134/68 (Sitting, Right Arm, Standard)  Physical Exam General Mental Status -Alert, cooperative and good historian. General Appearance-pleasant, Not in acute distress. Orientation-Oriented X3. Build & Nutrition-Well nourished and Well developed.  Head and Neck Head-normocephalic, atraumatic . Neck Global Assessment - supple, no bruit auscultated on the right, no bruit auscultated on the left.  Eye Pupil - Bilateral-Regular and Round. Motion - Bilateral-EOMI.  Chest and Lung Exam Auscultation Breath sounds - clear at anterior chest wall and clear at posterior chest wall. Adventitious sounds - No Adventitious sounds.  Cardiovascular Auscultation Rhythm - Regular rate and rhythm. Heart Sounds - S1 WNL and S2 WNL. Murmurs & Other Heart Sounds - Auscultation of the heart reveals - No Murmurs.  Abdomen Palpation/Percussion Tenderness - Abdomen is non-tender to palpation. Rigidity (guarding) - Abdomen is soft. Auscultation Auscultation of the abdomen reveals - Bowel sounds normal.  Female Genitourinary Note: Not done, not pertinent to present illness   Musculoskeletal Note: Very pleasant, well developed female, alert and oriented, in no apparent distress. Her right hip has normal range of motion without discomfort. The left hip can be flexed to about 100, minimal internal rotation, no external rotation, and about 20 degrees of abduction. Knee exam is unremarkable. Pulse, sensation, and motor are intact.  RADIOGRAPHS Her radiographs AP pelvis and lateral of the left hip show that she has bone on bone  arthritis of the hip with subchondral cystic formation.  Assessment & Plan Primary osteoarthritis of left hip (M16.12) Note:Surgical Plans: Left Total Hip Replacement - Anterior Approach  Disposition: Home  PCP: Dr. Darcus Austin  Topical TXA - Breast Cancer  Anesthesia Issues: None  Comments: AVOID RIGHT ARM FOR IV'S AND BP'S  Signed electronically by Joelene Millin, III PA-C

## 2015-06-07 NOTE — Anesthesia Postprocedure Evaluation (Signed)
  Anesthesia Post-op Note  Patient: Barbara Parks  Procedure(s) Performed: Procedure(s): LEFT TOTAL HIP ARTHROPLASTY ANTERIOR APPROACH (Left)  Patient Location: PACU  Anesthesia Type:General  Level of Consciousness: awake and alert   Airway and Oxygen Therapy: Patient Spontanous Breathing  Post-op Pain: mild  Post-op Assessment: Patient's Cardiovascular Status Stable              Post-op Vital Signs: Reviewed  Last Vitals:  Filed Vitals:   06/07/15 1833  BP: 151/71  Pulse: 90  Temp: 36.3 C  Resp: 16    Complications: No apparent anesthesia complications

## 2015-06-07 NOTE — Anesthesia Preprocedure Evaluation (Addendum)
Anesthesia Evaluation  Patient identified by MRN, date of birth, ID band Patient awake    Reviewed: Allergy & Precautions, NPO status , Patient's Chart, lab work & pertinent test results, reviewed documented beta blocker date and time   Airway Mallampati: II  TM Distance: >3 FB Neck ROM: Full    Dental   Pulmonary shortness of breath, asthma , former smoker,  Metastatic breast CA to lung   breath sounds clear to auscultation       Cardiovascular hypertension, Pt. on home beta blockers + Peripheral Vascular Disease   Rhythm:Regular Rate:Normal     Neuro/Psych negative neurological ROS     GI/Hepatic Neg liver ROS, GERD  ,  Endo/Other  negative endocrine ROS  Renal/GU negative Renal ROS     Musculoskeletal  (+) Arthritis ,   Abdominal   Peds  Hematology negative hematology ROS (+)   Anesthesia Other Findings   Reproductive/Obstetrics                            Lab Results  Component Value Date   WBC 8.7 05/29/2015   HGB 12.6 05/29/2015   HCT 38.5 05/29/2015   MCV 80.4 05/29/2015   PLT 305 05/29/2015   Lab Results  Component Value Date   CREATININE 1.01* 05/29/2015   BUN 17 05/29/2015   NA 139 05/29/2015   K 4.2 05/29/2015   CL 109 05/29/2015   CO2 22 05/29/2015   Lab Results  Component Value Date   INR 1.14 05/29/2015   INR 1.00* 10/07/2011   INR 1.09 09/11/2011   PROTIME 12.0 10/07/2011    Anesthesia Physical Anesthesia Plan  ASA: III  Anesthesia Plan: General   Post-op Pain Management:    Induction: Intravenous  Airway Management Planned: Oral ETT  Additional Equipment:   Intra-op Plan:   Post-operative Plan: Extubation in OR  Informed Consent: I have reviewed the patients History and Physical, chart, labs and discussed the procedure including the risks, benefits and alternatives for the proposed anesthesia with the patient or authorized representative who  has indicated his/her understanding and acceptance.   Dental advisory given  Plan Discussed with: CRNA  Anesthesia Plan Comments:        Anesthesia Quick Evaluation

## 2015-06-07 NOTE — Anesthesia Procedure Notes (Signed)
Procedure Name: Intubation Date/Time: 06/07/2015 3:53 PM Performed by: Maxwell Caul Pre-anesthesia Checklist: Patient identified, Emergency Drugs available, Suction available and Patient being monitored Patient Re-evaluated:Patient Re-evaluated prior to inductionOxygen Delivery Method: Circle System Utilized Preoxygenation: Pre-oxygenation with 100% oxygen Intubation Type: IV induction Ventilation: Mask ventilation without difficulty Laryngoscope Size: Mac and 4 Grade View: Grade I Tube type: Oral Tube size: 7.5 mm Number of attempts: 1 Airway Equipment and Method: Stylet Placement Confirmation: ETT inserted through vocal cords under direct vision,  positive ETCO2 and breath sounds checked- equal and bilateral Secured at: 21 cm Tube secured with: Tape Dental Injury: Teeth and Oropharynx as per pre-operative assessment

## 2015-06-08 ENCOUNTER — Encounter (HOSPITAL_COMMUNITY): Payer: Self-pay | Admitting: Orthopedic Surgery

## 2015-06-08 ENCOUNTER — Other Ambulatory Visit: Payer: Self-pay | Admitting: *Deleted

## 2015-06-08 LAB — CBC
HEMATOCRIT: 33 % — AB (ref 36.0–46.0)
HEMOGLOBIN: 10.6 g/dL — AB (ref 12.0–15.0)
MCH: 25.3 pg — AB (ref 26.0–34.0)
MCHC: 32.1 g/dL (ref 30.0–36.0)
MCV: 78.8 fL (ref 78.0–100.0)
Platelets: 285 10*3/uL (ref 150–400)
RBC: 4.19 MIL/uL (ref 3.87–5.11)
RDW: 13.6 % (ref 11.5–15.5)
WBC: 10.2 10*3/uL (ref 4.0–10.5)

## 2015-06-08 LAB — BASIC METABOLIC PANEL
Anion gap: 10 (ref 5–15)
BUN: 17 mg/dL (ref 6–20)
CALCIUM: 8.7 mg/dL — AB (ref 8.9–10.3)
CHLORIDE: 100 mmol/L — AB (ref 101–111)
CO2: 21 mmol/L — ABNORMAL LOW (ref 22–32)
CREATININE: 1.07 mg/dL — AB (ref 0.44–1.00)
GFR calc Af Amer: >60 mL/min (ref >60)
GFR calc non Af Amer: 54 mL/min — ABNORMAL LOW (ref >60)
GLUCOSE: 339 mg/dL — AB (ref 65–99)
Potassium: 4 mmol/L (ref 3.5–5.1)
Sodium: 131 mmol/L — ABNORMAL LOW (ref 135–145)

## 2015-06-08 MED ORDER — RIVAROXABAN 10 MG PO TABS: 10.0000 mg | ORAL_TABLET | Freq: Every day | ORAL | Status: DC

## 2015-06-08 MED ORDER — METHOCARBAMOL 500 MG PO TABS: 500.0000 mg | ORAL_TABLET | Freq: Four times a day (QID) | ORAL | Status: DC | PRN

## 2015-06-08 MED ORDER — OXYCODONE HCL 5 MG PO TABS: 5.0000 mg | ORAL_TABLET | ORAL | Status: DC | PRN

## 2015-06-08 MED ORDER — TRAMADOL HCL 50 MG PO TABS: 50.0000 mg | ORAL_TABLET | Freq: Four times a day (QID) | ORAL | Status: DC | PRN

## 2015-06-08 NOTE — Progress Notes (Signed)
Patient has allergy to Aspirin  DPerkins PA paged and orders received for discharge teaching. Patient is not to take Aspirin after xarelto is finished.  Bethann Punches RN

## 2015-06-08 NOTE — Discharge Instructions (Addendum)
° °Dr. Frank Aluisio °Total Joint Specialist °[facility] °[address] °[place], [place] [address] °[phone] ° °ANTERIOR APPROACH TOTAL HIP REPLACEMENT POSTOPERATIVE DIRECTIONS ° ° °Hip Rehabilitation, Guidelines Following Surgery  °The results of a hip operation are greatly improved after range of motion and muscle strengthening exercises. Follow all safety measures which are given to protect your hip. If any of these exercises cause increased pain or swelling in your joint, decrease the amount until you are comfortable again. Then slowly increase the exercises. Call your caregiver if you have problems or questions.  ° °HOME CARE INSTRUCTIONS  °Remove items at home which could result in a fall. This includes throw rugs or furniture in walking pathways.  °· ICE to the affected hip every three hours for 30 minutes at a time and then as needed for pain and swelling.  Continue to use ice on the hip for pain and swelling from surgery. You may notice swelling that will progress down to the foot and ankle.  This is normal after surgery.  Elevate the leg when you are not up walking on it.   °· Continue to use the breathing machine which will help keep your temperature down.  It is common for your temperature to cycle up and down following surgery, especially at night when you are not up moving around and exerting yourself.  The breathing machine keeps your lungs expanded and your temperature down. ° ° °DIET °You may resume your previous home diet once your are discharged from the hospital. ° °DRESSING / WOUND CARE / SHOWERING °You may shower 3 days after surgery, but keep the wounds dry during showering.  You may use an occlusive plastic wrap (Press'n Seal for example), NO SOAKING/SUBMERGING IN THE BATHTUB.  If the bandage gets wet, change with a clean dry gauze.  If the incision gets wet, pat the wound dry with a clean towel. °You may start showering once you are discharged home but do not  submerge the incision under water. Just pat the incision dry and apply a dry gauze dressing on daily. °Change the surgical dressing daily and reapply a dry dressing each time. ° °ACTIVITY °Walk with your walker as instructed. °Use walker as long as suggested by your caregivers. °Avoid periods of inactivity such as sitting longer than an hour when not asleep. This helps prevent blood clots.  °You may resume a sexual relationship in one month or when given the OK by your doctor.  °You may return to work once you are cleared by your doctor.  °Do not drive a car for 6 weeks or until released by you surgeon.  °Do not drive while taking narcotics. ° °WEIGHT BEARING °Weight bearing as tolerated with assist device (walker, cane, etc) as directed, use it as long as suggested by your surgeon or therapist, typically at least 4-6 weeks. ° °POSTOPERATIVE CONSTIPATION PROTOCOL °Constipation - defined medically as fewer than three stools per week and severe constipation as less than one stool per week. ° °One of the most common issues patients have following surgery is constipation.  Even if you have a regular bowel pattern at home, your normal regimen is likely to be disrupted due to multiple reasons following surgery.  Combination of anesthesia, postoperative narcotics, change in appetite and fluid intake all can affect your bowels.  In order to avoid complications following surgery, here are some recommendations in order to help you during your recovery period. ° °Colace (docusate) - Pick up an over-the-counter   form of Colace or another stool softener and take twice a day as long as you are requiring postoperative pain medications.  Take with a full glass of water daily.  If you experience loose stools or diarrhea, hold the colace until you stool forms back up.  If your symptoms do not get better within 1 week or if they get worse, check with your doctor. ° °Dulcolax (bisacodyl) - Pick up over-the-counter and take as directed  by the product packaging as needed to assist with the movement of your bowels.  Take with a full glass of water.  Use this product as needed if not relieved by Colace only.  ° °MiraLax (polyethylene glycol) - Pick up over-the-counter to have on hand.  MiraLax is a solution that will increase the amount of water in your bowels to assist with bowel movements.  Take as directed and can mix with a glass of water, juice, soda, coffee, or tea.  Take if you go more than two days without a movement. °Do not use MiraLax more than once per day. Call your doctor if you are still constipated or irregular after using this medication for 7 days in a row. ° °If you continue to have problems with postoperative constipation, please contact the office for further assistance and recommendations.  If you experience "the worst abdominal pain ever" or develop nausea or vomiting, please contact the office immediatly for further recommendations for treatment. ° °ITCHING ° If you experience itching with your medications, try taking only a single pain pill, or even half a pain pill at a time.  You can also use Benadryl over the counter for itching or also to help with sleep.  ° °TED HOSE STOCKINGS °Wear the elastic stockings on both legs for three weeks following surgery during the day but you may remove then at night for sleeping. ° °MEDICATIONS °See your medication summary on the “After Visit Summary” that the nursing staff will review with you prior to discharge.  You may have some home medications which will be placed on hold until you complete the course of blood thinner medication.  It is important for you to complete the blood thinner medication as prescribed by your surgeon.  Continue your approved medications as instructed at time of discharge. ° °PRECAUTIONS °If you experience chest pain or shortness of breath - call 911 immediately for transfer to the hospital emergency department.  °If you develop a fever greater that 101 F,  purulent drainage from wound, increased redness or drainage from wound, foul odor from the wound/dressing, or calf pain - CONTACT YOUR SURGEON.   °                                                °FOLLOW-UP APPOINTMENTS °Make sure you keep all of your appointments after your operation with your surgeon and caregivers. You should call the office at the above phone number and make an appointment for approximately two weeks after the date of your surgery or on the date instructed by your surgeon outlined in the "After Visit Summary". ° °RANGE OF MOTION AND STRENGTHENING EXERCISES  °These exercises are designed to help you keep full movement of your hip joint. Follow your caregiver's or physical therapist's instructions. Perform all exercises about fifteen times, three times per day or as directed. Exercise both hips, even if you   have had only one joint replacement. These exercises can be done on a training (exercise) mat, on the floor, on a table or on a bed. Use whatever works the best and is most comfortable for you. Use music or television while you are exercising so that the exercises are a pleasant break in your day. This will make your life better with the exercises acting as a break in routine you can look forward to.  Lying on your back, slowly slide your foot toward your buttocks, raising your knee up off the floor. Then slowly slide your foot back down until your leg is straight again.  Lying on your back spread your legs as far apart as you can without causing discomfort.  Lying on your side, raise your upper leg and foot straight up from the floor as far as is comfortable. Slowly lower the leg and repeat.  Lying on your back, tighten up the muscle in the front of your thigh (quadriceps muscles). You can do this by keeping your leg straight and trying to raise your heel off the floor. This helps strengthen the largest muscle supporting your knee.  Lying on your back, tighten up the muscles of your  buttocks both with the legs straight and with the knee bent at a comfortable angle while keeping your heel on the floor.   IF YOU ARE TRANSFERRED TO A SKILLED REHAB FACILITY If the patient is transferred to a skilled rehab facility following release from the hospital, a list of the current medications will be sent to the facility for the patient to continue.  When discharged from the skilled rehab facility, please have the facility set up the patient's [place] prior to being released. Also, the skilled facility will be responsible for providing the patient with their medications at time of release from the facility to include their pain medication, the muscle relaxants, and their blood thinner medication. If the patient is still at the rehab facility at time of the two week follow up appointment, the skilled rehab facility will also need to assist the patient in arranging follow up appointment in our office and any transportation needs.  MAKE SURE YOU:  Understand these instructions.  Get help right away if you are not doing well or get worse.    Pick up stool softner and laxative for home use following surgery while on pain medications. Do not submerge incision under water. Please use good hand washing techniques while changing dressing each day. May shower starting three days after surgery. Please use a clean towel to pat the incision dry following showers. Continue to use ice for pain and swelling after surgery. Do not use any lotions or creams on the incision until instructed by your surgeon.   Information on my medicine - XARELTO (Rivaroxaban)  This medication education was reviewed with me or my healthcare representative as part of my discharge preparation.    Why was Xarelto prescribed for you? Xarelto was prescribed for you to reduce the risk of blood clots forming after orthopedic surgery. The medical term for these abnormal blood clots is venous  thromboembolism (VTE).  What do you need to know about xarelto ? Take your Xarelto ONCE DAILY at the same time every day. You may take it either with or without food.  If you have difficulty swallowing the tablet whole, you may crush it and mix in applesauce just prior to taking your dose.  Take Xarelto exactly as prescribed by your doctor  and DO NOT stop taking Xarelto without talking to the doctor who prescribed the medication.  Stopping without other VTE prevention medication to take the place of Xarelto may increase your risk of developing a clot.  After discharge, you should have regular check-up appointments with your healthcare provider that is prescribing your Xarelto.    What do you do if you miss a dose? If you miss a dose, take it as soon as you remember on the same day then continue your regularly scheduled once daily regimen the next day. Do not take two doses of Xarelto on the same day.   Important Safety Information A possible side effect of Xarelto is bleeding. You should call your healthcare provider right away if you experience any of the following: ? Bleeding from an injury or your nose that does not stop. ? Unusual colored urine (red or dark brown) or unusual colored stools (red or black). ? Unusual bruising for unknown reasons. ? A serious fall or if you hit your head (even if there is no bleeding).  Some medicines may interact with Xarelto and might increase your risk of bleeding while on Xarelto. To help avoid this, consult your healthcare provider or pharmacist prior to using any new prescription or non-prescription medications, including herbals, vitamins, non-steroidal anti-inflammatory drugs (NSAIDs) and supplements.  This website has more information on Xarelto: [email or web address]     Take Xarelto for two and a half more weeks, then discontinue Xarelto.

## 2015-06-08 NOTE — Care Management Note (Signed)
Case Management Note  Patient Details  Name: Barbara Parks MRN: [id number] Date of Birth: Sep 26, 1950  Subjective/Objective:                  LEFT TOTAL HIP ARTHROPLASTY ANTERIOR APPROACH (Left) Action/Plan: Discharge planning Expected Discharge Date:  06/08/15               Expected Discharge Plan:  [place]  In-House Referral:     Discharge planning Services  CM Consult  Post Acute Care Choice:  Home Health Choice offered to:  Patient  DME Arranged:  Walker rolling DME Agency:  [facility] Arranged:  PT [facility] Agency:  [facility]  Status of Service:  Completed, signed off  Medicare Important Message Given:    Date Medicare IM Given:    Medicare IM give by:    Date Additional Medicare IM Given:    Additional Medicare Important Message give by:     If discussed at [place] of Stay Meetings, dates discussed:    Additional Comments: CM met with pt in room to offer choice of home health agency.  Pt chooses [facility] to render HHPT.  Referral given to [employer], Tim.  CM called [facility] DME rep, Lecretia to please deliver the rolling walker to room prior to discharge.  No other CM needs were communicated. Dellie Catholic, RN 06/08/2015, 1:15 PM

## 2015-06-08 NOTE — Progress Notes (Signed)
   Subjective: 1 Day Post-Op Procedure(s) (LRB): LEFT TOTAL HIP ARTHROPLASTY ANTERIOR APPROACH (Left) Patient reports pain as mild.   Patient seen in rounds with Dr. Wynelle Link. Patient is well, and has had no acute complaints or problems Patient is ready to go home following therapy today.  Objective: Vital signs in last 24 hours: Temp:  [97.4 F (36.3 C)-98.2 F (36.8 C)] 97.8 F (36.6 C) (11/10 0443) Pulse Rate:  [82-98] 98 (11/10 0443) Resp:  [10-18] 16 (11/10 0443) BP: (111-169)/(54-110) 118/54 mmHg (11/10 0443) SpO2:  [97 %-100 %] 99 % (11/10 0443) Weight:  [76.658 kg (169 lb)] 76.658 kg (169 lb) (11/09 1354)  Intake/Output from previous day:  Intake/Output Summary (Last 24 hours) at 06/08/15 1002 Last data filed at 06/08/15 0800  Gross per 24 hour  Intake   2675 ml  Output   1592 ml  Net   1083 ml    Intake/Output this shift: Total I/O In: 360 [P.O.:360] Out: -   Labs:  Recent Labs  06/08/15 0526  HGB 10.6*    Recent Labs  06/08/15 0526  WBC 10.2  RBC 4.19  HCT 33.0*  PLT 285    Recent Labs  06/08/15 0526  NA 131*  K 4.0  CL 100*  CO2 21*  BUN 17  CREATININE 1.07*  GLUCOSE 339*  CALCIUM 8.7*   No results for input(s): LABPT, INR in the last 72 hours.  EXAM: General - Patient is Alert, Appropriate and Oriented Extremity - Neurovascular intact Sensation intact distally Dorsiflexion/Plantar flexion intact Incision - clean, dry, no drainage Motor Function - intact, moving foot and toes well on exam.   Assessment/Plan: 1 Day Post-Op Procedure(s) (LRB): LEFT TOTAL HIP ARTHROPLASTY ANTERIOR APPROACH (Left) Procedure(s) (LRB): LEFT TOTAL HIP ARTHROPLASTY ANTERIOR APPROACH (Left) Past Medical History  Diagnosis Date  . Shortness of breath   . Arthritis     left hip  . Osteopenia due to cancer therapy 09/09/2013  . Hypercholesteremia   . breast ca 2005    breast/chemo R mastectomy  . Metastasis to lung ([place]) dx'd 08/2011  . GERD  (gastroesophageal reflux disease)    Principal Problem:   Osteoarthritis of left hip Active Problems:   OA (osteoarthritis) of hip  Estimated body mass index is 28.99 kg/(m^2) as calculated from the following:   Height as of this encounter: 5\' 4"  (1.626 m).   Weight as of this encounter: 76.658 kg (169 lb). Up with therapy Discharge home with home health Diet - Cardiac diet Follow up - in 2 weeks Activity - WBAT Disposition - Home Condition Upon Discharge - Good D/C Meds - See DC Summary DVT Prophylaxis - Xarelto  Barbara Muslim, PA-C Orthopaedic Surgery 06/08/2015, 10:02 AM

## 2015-06-08 NOTE — Progress Notes (Signed)
Physical Therapy Treatment Patient Details Name: Barbara Parks MRN: [id number] DOB: 1950/09/27 Today's Date: 06/08/2015    History of Present Illness 64 yo female s/p LEFT TOTAL HIP ARTHROPLASTY ANTERIOR APPROACH (no precautions)     PT Comments    Pt progressing well with mobility and eager for dc home.  Reviewed stairs and car transfers.  Follow Up Recommendations  Home health PT     Equipment Recommendations  Rolling walker with 5" wheels    Recommendations for Other Services OT consult     Precautions / Restrictions Precautions Precautions: Fall Restrictions Weight Bearing Restrictions: No LLE Weight Bearing: Weight bearing as tolerated    Mobility  Bed Mobility Overal bed mobility: Needs Assistance Bed Mobility: Sit to Supine     Supine to sit: Min guard Sit to supine: Min guard   General bed mobility comments: Pt OOB with nursing  Transfers Overall transfer level: Needs assistance Equipment used: Rolling walker (2 wheeled) Transfers: Sit to/from Stand Sit to Stand: Min guard         General transfer comment: min cues for LE management and use of UEs to self assist  Ambulation/Gait Ambulation/Gait assistance: Min guard;Supervision Ambulation Distance (Feet): 300 Feet Assistive device: Rolling walker (2 wheeled) Gait Pattern/deviations: Step-to pattern;Step-through pattern;Decreased step length - right;Decreased step length - left;Shuffle;Trunk flexed     General Gait Details: cues for sequence, posture and position from RW   Stairs Stairs: Yes Stairs assistance: Min assist Stair Management: One rail Left;Step to pattern;Forwards;With cane Number of Stairs: 4 General stair comments: min cues for sequence and foot/cane placement  Wheelchair Mobility    Modified Rankin (Stroke Patients Only)       Balance Overall balance assessment: Modified Independent;No apparent balance deficits (not formally assessed)                                   Cognition Arousal/Alertness: Awake/alert Behavior During Therapy: WFL for tasks assessed/performed Overall Cognitive Status: Within Functional Limits for tasks assessed                      Exercises Total Joint Exercises Ankle Circles/Pumps: AROM;Both;15 reps;Supine Quad Sets: AROM;Both;10 reps;Supine Heel Slides: AAROM;Left;20 reps;Supine Hip ABduction/ADduction: AAROM;Left;15 reps;Supine    General Comments        Pertinent Vitals/Pain Pain Assessment: 0-10 Pain Score: 2  Pain Location: L hip Pain Descriptors / Indicators: Aching Pain Intervention(s): Limited activity within patient's tolerance;Monitored during session;Premedicated before session;Ice applied    Home Living Family/patient expects to be discharged to:: Private residence Living Arrangements: Alone Available Help at Discharge: Family;Available 24 hours/day Type of Home: House Home Access: Stairs to enter Entrance Stairs-Rails: Left Home Layout: One level Home Equipment: Cane - single point      Prior Function Level of Independence: Independent;Independent with assistive device(s)          PT Goals (current goals can now be found in the care plan section) Acute Rehab PT Goals Patient Stated Goal: go home today PT Goal Formulation: With patient Potential to Achieve Goals: Good Progress towards PT goals: Progressing toward goals    Frequency  7X/week    PT Plan Current plan remains appropriate    Co-evaluation             End of Session Equipment Utilized During Treatment: Gait belt Activity Tolerance: Patient tolerated treatment well Patient left: in bed;with call bell/phone within  reach     Time: 1346-1413 PT Time Calculation (min) (ACUTE ONLY): 27 min  Charges:  $Gait Training: 8-22 mins $Therapeutic Exercise: 8-22 mins $Therapeutic Activity: 8-22 mins                    G Codes:      Jerrett Baldinger 06-14-15, 3:38 PM

## 2015-06-08 NOTE — Progress Notes (Signed)
Discharge instructions given to patient. Instructed patient not to take Aspirin after finishing Xarelto as instructed per [email or web address] [email or web address]    D [employer]

## 2015-06-08 NOTE — Evaluation (Signed)
Physical Therapy Evaluation Patient Details Name: Barbara Parks MRN: [id number] DOB: 1951/06/19 Today's Date: 06/08/2015   History of Present Illness  64 yo female s/p LEFT TOTAL HIP ARTHROPLASTY ANTERIOR APPROACH (no precautions)   Clinical Impression  Pt s/p L THR presents with decreased L LE strength/ROM and post op pain limiting functional mobility.  Pt should progress to dc home with family assist and HHPT follow up.    Follow Up Recommendations Home health PT    Equipment Recommendations  Rolling walker with 5" wheels    Recommendations for Other Services OT consult     Precautions / Restrictions Precautions Precautions: Fall Restrictions Weight Bearing Restrictions: No LLE Weight Bearing: Weight bearing as tolerated      Mobility  Bed Mobility               General bed mobility comments: Pt OOB with nursing  Transfers Overall transfer level: Needs assistance Equipment used: Rolling walker (2 wheeled) Transfers: Sit to/from Stand Sit to Stand: Min guard         General transfer comment: cues for LE management and use of UEs to self assist  Ambulation/Gait Ambulation/Gait assistance: Min assist;Min guard Ambulation Distance (Feet): 123 Feet Assistive device: Rolling walker (2 wheeled) Gait Pattern/deviations: Step-to pattern;Step-through pattern;Decreased step length - right;Decreased step length - left;Shuffle;Trunk flexed     General Gait Details: cues for sequence, posture and position from [employer]            Wheelchair Mobility    Modified Rankin (Stroke Patients Only)       Balance Overall balance assessment: Modified Independent;No apparent balance deficits (not formally assessed)                                           Pertinent Vitals/Pain Pain Assessment: 0-10 Pain Score: 2  Pain Location: L hip Pain Descriptors / Indicators: Aching;Sore Pain Intervention(s): Limited activity within patient's  tolerance;Monitored during session;Premedicated before session;Ice applied    Home Living Family/patient expects to be discharged to:: Private residence Living Arrangements: Alone Available Help at Discharge: Family;Available 24 hours/day Type of Home: House Home Access: Stairs to enter Entrance Stairs-Rails: Left Entrance Stairs-Number of Steps: front-3; side-4 (wider and not as steep as front steps) Home Layout: One level Home Equipment: Cane - single point      Prior Function Level of Independence: Independent;Independent with assistive device(s)               Hand Dominance   Dominant Hand: Right    Extremity/Trunk Assessment   Upper Extremity Assessment: Overall WFL for tasks assessed           Lower Extremity Assessment: LLE deficits/detail   LLE Deficits / Details: strength at hip 3-/5 with AAROM at hip to 85 flex and 15 abd  Cervical / Trunk Assessment: Normal  Communication   Communication: No difficulties  Cognition Arousal/Alertness: Awake/alert Behavior During Therapy: WFL for tasks assessed/performed Overall Cognitive Status: Within Functional Limits for tasks assessed                      General Comments      Exercises Total Joint Exercises Ankle Circles/Pumps: AROM;Both;15 reps;Supine Quad Sets: AROM;Both;10 reps;Supine Heel Slides: AAROM;Left;20 reps;Supine Hip ABduction/ADduction: AAROM;Left;15 reps;Supine      Assessment/Plan    PT Assessment Patient needs continued PT services  PT Diagnosis Difficulty walking   PT Problem List Decreased strength;Decreased range of motion;Decreased activity tolerance;Decreased mobility;Decreased knowledge of use of DME;Pain  PT Treatment Interventions DME instruction;Gait training;Stair training;Functional mobility training;Therapeutic activities;Therapeutic exercise;Patient/family education   PT Goals (Current goals can be found in the Care Plan section) Acute Rehab PT Goals Patient  Stated Goal: go home today PT Goal Formulation: With patient Potential to Achieve Goals: Good    Frequency 7X/week   Barriers to discharge        Co-evaluation               End of Session Equipment Utilized During Treatment: Gait belt Activity Tolerance: Patient tolerated treatment well Patient left: in chair;with call bell/phone within reach Nurse Communication: Mobility status         Time: [id number] PT Time Calculation (min) (ACUTE ONLY): 28 min   Charges:   PT Evaluation $Initial PT Evaluation Tier I: 1 Procedure PT Treatments $Therapeutic Exercise: 8-22 mins   PT G Codes:        [name] Jun 15, 2015, 3:33 PM

## 2015-06-08 NOTE — Progress Notes (Signed)
Utilization review completed.  

## 2015-06-08 NOTE — Discharge Summary (Signed)
Physician Discharge Summary   Patient ID: Barbara Parks MRN: [id number] DOB/AGE: 64-Jul-1952 64 y.o.  Admit date: 06/07/2015 Discharge date: 06-08-2015  Primary Diagnosis:  Osteoarthritis of the Left hip.   Admission Diagnoses:  Past Medical History  Diagnosis Date  . Shortness of breath   . Arthritis     left hip  . Osteopenia due to cancer therapy 09/09/2013  . Hypercholesteremia   . breast ca 2005    breast/chemo R mastectomy  . Metastasis to lung ([place]) dx'd 08/2011  . GERD (gastroesophageal reflux disease)    Discharge Diagnoses:   Principal Problem:   Osteoarthritis of left hip Active Problems:   OA (osteoarthritis) of hip  Estimated body mass index is 28.99 kg/(m^2) as calculated from the following:   Height as of this encounter: 5' 4"  (1.626 m).   Weight as of this encounter: 76.658 kg (169 lb).  Procedure(s) (LRB): LEFT TOTAL HIP ARTHROPLASTY ANTERIOR APPROACH (Left)   Consults: None  HPI: Barbara Parks is a 64 y.o. female who has advanced end-  stage arthritis of her Left hip with progressively worsening pain and  dysfunction.The patient has failed nonoperative management and presents for  total hip arthroplasty.   Laboratory Data: Admission on 06/07/2015  Component Date Value Ref Range Status  . WBC 06/08/2015 10.2  4.0 - 10.5 K/uL Final  . RBC 06/08/2015 4.19  3.87 - 5.11 MIL/uL Final  . Hemoglobin 06/08/2015 10.6* 12.0 - 15.0 g/dL Final  . HCT 06/08/2015 33.0* 36.0 - 46.0 % Final  . MCV 06/08/2015 78.8  78.0 - 100.0 fL Final  . MCH 06/08/2015 25.3* 26.0 - 34.0 pg Final  . MCHC 06/08/2015 32.1  30.0 - 36.0 g/dL Final  . RDW 06/08/2015 13.6  11.5 - 15.5 % Final  . Platelets 06/08/2015 285  150 - 400 K/uL Final  . Sodium 06/08/2015 131* 135 - 145 mmol/L Final  . Potassium 06/08/2015 4.0  3.5 - 5.1 mmol/L Final  . Chloride 06/08/2015 100* 101 - 111 mmol/L Final  . CO2 06/08/2015 21* 22 - 32 mmol/L Final  . Glucose, Bld 06/08/2015 339* 65 - 99 mg/dL  Final  . BUN 06/08/2015 17  6 - 20 mg/dL Final  . Creatinine, Ser 06/08/2015 1.07* 0.44 - 1.00 mg/dL Final  . Calcium 06/08/2015 8.7* 8.9 - 10.3 mg/dL Final  . GFR calc non Af Amer 06/08/2015 54* >60 mL/min Final  . GFR calc Af Amer 06/08/2015 >60  >60 mL/min Final   Comment: (NOTE) The eGFR has been calculated using the CKD EPI equation. This calculation has not been validated in all clinical situations. eGFR's persistently <60 mL/min signify possible Chronic Kidney Disease.   Georgiann Hahn gap 06/08/2015 10  5 - 15 Final  Hospital Outpatient Visit on 05/29/2015  Component Date Value Ref Range Status  . aPTT 05/29/2015 39* 24 - 37 seconds Final   Comment:        IF BASELINE aPTT IS ELEVATED, SUGGEST PATIENT RISK ASSESSMENT BE USED TO DETERMINE APPROPRIATE ANTICOAGULANT THERAPY.   . WBC 05/29/2015 8.7  4.0 - 10.5 K/uL Final  . RBC 05/29/2015 4.79  3.87 - 5.11 MIL/uL Final  . Hemoglobin 05/29/2015 12.6  12.0 - 15.0 g/dL Final  . HCT 05/29/2015 38.5  36.0 - 46.0 % Final  . MCV 05/29/2015 80.4  78.0 - 100.0 fL Final  . MCH 05/29/2015 26.3  26.0 - 34.0 pg Final  . MCHC 05/29/2015 32.7  30.0 - 36.0 g/dL Final  .  RDW 05/29/2015 13.8  11.5 - 15.5 % Final  . Platelets 05/29/2015 305  150 - 400 K/uL Final  . Sodium 05/29/2015 139  135 - 145 mmol/L Final  . Potassium 05/29/2015 4.2  3.5 - 5.1 mmol/L Final  . Chloride 05/29/2015 109  101 - 111 mmol/L Final  . CO2 05/29/2015 22  22 - 32 mmol/L Final  . Glucose, Bld 05/29/2015 177* 65 - 99 mg/dL Final  . BUN 05/29/2015 17  6 - 20 mg/dL Final  . Creatinine, Ser 05/29/2015 1.01* 0.44 - 1.00 mg/dL Final  . Calcium 05/29/2015 9.3  8.9 - 10.3 mg/dL Final  . Total Protein 05/29/2015 7.4  6.5 - 8.1 g/dL Final  . Albumin 05/29/2015 3.6  3.5 - 5.0 g/dL Final  . AST 05/29/2015 51* 15 - 41 U/L Final  . ALT 05/29/2015 39  14 - 54 U/L Final  . Alkaline Phosphatase 05/29/2015 203* 38 - 126 U/L Final  . Total Bilirubin 05/29/2015 0.3  0.3 - 1.2 mg/dL Final   . GFR calc non Af Amer 05/29/2015 58* >60 mL/min Final  . GFR calc Af Amer 05/29/2015 >60  >60 mL/min Final   Comment: (NOTE) The eGFR has been calculated using the CKD EPI equation. This calculation has not been validated in all clinical situations. eGFR's persistently <60 mL/min signify possible Chronic Kidney Disease.   . Anion gap 05/29/2015 8  5 - 15 Final  . Prothrombin Time 05/29/2015 14.8  11.6 - 15.2 seconds Final  . INR 05/29/2015 1.14  0.00 - 1.49 Final  . ABO/RH(D) 05/29/2015 A POS   Final  . Antibody Screen 05/29/2015 NEG   Final  . Sample Expiration 05/29/2015 06/10/2015   Final  . Extend sample reason 05/29/2015 NO TRANSFUSIONS OR PREGNANCY IN THE PAST 3 MONTHS   Final  . Color, Urine 05/29/2015 YELLOW  YELLOW Final  . APPearance 05/29/2015 CLEAR  CLEAR Final  . Specific Gravity, Urine 05/29/2015 1.019  1.005 - 1.030 Final  . pH 05/29/2015 5.5  5.0 - 8.0 Final  . Glucose, UA 05/29/2015 NEGATIVE  NEGATIVE mg/dL Final  . Hgb urine dipstick 05/29/2015 MODERATE* NEGATIVE Final  . Bilirubin Urine 05/29/2015 NEGATIVE  NEGATIVE Final  . Ketones, ur 05/29/2015 NEGATIVE  NEGATIVE mg/dL Final  . Protein, ur 05/29/2015 30* NEGATIVE mg/dL Final  . Urobilinogen, UA 05/29/2015 1.0  0.0 - 1.0 mg/dL Final  . Nitrite 05/29/2015 NEGATIVE  NEGATIVE Final  . Leukocytes, UA 05/29/2015 NEGATIVE  NEGATIVE Final  . MRSA, PCR 05/29/2015 NEGATIVE  NEGATIVE Final  . Staphylococcus aureus 05/29/2015 POSITIVE* NEGATIVE Final   Comment:        The Xpert SA Assay (FDA approved for NASAL specimens in patients over 3 years of age), is one component of a comprehensive surveillance program.  Test performance has been validated by [facility] for patients greater than or equal to 24 year old. It is not intended to diagnose infection nor to guide or monitor treatment.   . Squamous Epithelial / LPF 05/29/2015 RARE  RARE Final  . WBC, UA 05/29/2015 0-2  <3 WBC/hpf Final  . RBC / HPF 05/29/2015  11-20  <3 RBC/hpf Final  . ABO/RH(D) 05/29/2015 A POS   Final  Appointment on 05/18/2015  Component Date Value Ref Range Status  . WBC 05/18/2015 8.1  3.9 - 10.3 10e3/uL Final  . NEUT# 05/18/2015 6.3  1.5 - 6.5 10e3/uL Final  . HGB 05/18/2015 13.4  11.6 - 15.9 g/dL Final  . HCT  05/18/2015 40.7  34.8 - 46.6 % Final  . Platelets 05/18/2015 264  145 - 400 10e3/uL Final  . MCV 05/18/2015 78.6* 79.5 - 101.0 fL Final  . MCH 05/18/2015 25.9  25.1 - 34.0 pg Final  . MCHC 05/18/2015 32.9  31.5 - 36.0 g/dL Final  . RBC 05/18/2015 5.18  3.70 - 5.45 10e6/uL Final  . RDW 05/18/2015 13.7  11.2 - 14.5 % Final  . lymph# 05/18/2015 0.8* 0.9 - 3.3 10e3/uL Final  . MONO# 05/18/2015 0.8  0.1 - 0.9 10e3/uL Final  . Eosinophils Absolute 05/18/2015 0.1  0.0 - 0.5 10e3/uL Final  . Basophils Absolute 05/18/2015 0.1  0.0 - 0.1 10e3/uL Final  . NEUT% 05/18/2015 77.0* 38.4 - 76.8 % Final  . LYMPH% 05/18/2015 10.3* 14.0 - 49.7 % Final  . MONO% 05/18/2015 10.1  0.0 - 14.0 % Final  . EOS% 05/18/2015 1.7  0.0 - 7.0 % Final  . BASO% 05/18/2015 0.9  0.0 - 2.0 % Final  . Sodium 05/18/2015 140  136 - 145 mEq/L Final  . Potassium 05/18/2015 3.8  3.5 - 5.1 mEq/L Final  . Chloride 05/18/2015 110* 98 - 109 mEq/L Final  . CO2 05/18/2015 22  22 - 29 mEq/L Final  . Glucose 05/18/2015 128  70 - 140 mg/dl Final   Glucose reference range is for nonfasting patients. Fasting glucose reference range is 70- 100.  Marland Kitchen BUN 05/18/2015 18.3  7.0 - 26.0 mg/dL Final  . Creatinine 05/18/2015 1.0  0.6 - 1.1 mg/dL Final  . Total Bilirubin 05/18/2015 <0.30  0.20 - 1.20 mg/dL Final  . Alkaline Phosphatase 05/18/2015 244* 40 - 150 U/L Final  . AST 05/18/2015 40* 5 - 34 U/L Final  . ALT 05/18/2015 32  0 - 55 U/L Final  . Total Protein 05/18/2015 7.7  6.4 - 8.3 g/dL Final  . Albumin 05/18/2015 3.4* 3.5 - 5.0 g/dL Final  . Calcium 05/18/2015 10.1  8.4 - 10.4 mg/dL Final  . Anion Gap 05/18/2015 8  3 - 11 mEq/L Final  . EGFR 05/18/2015 62* >90  ml/min/1.73 m2 Final   eGFR is calculated using the CKD-EPI Creatinine Equation (2009)  . Cholesterol 05/18/2015 256* 125 - 200 mg/dL Final  . Triglycerides 05/18/2015 217* <150 mg/dL Final  . HDL 05/18/2015 43* >=46 mg/dL Final  . Total CHOL/HDL Ratio 05/18/2015 6.0* <=5.0 Ratio Final  . VLDL 05/18/2015 43* <30 mg/dL Final  . LDL Cholesterol 05/18/2015 170* <130 mg/dL Final   Comment:  Total Cholesterol/HDL Ratio:CHD Risk                       Coronary Heart Disease Risk Table                                       Men       Women         1/2 Average Risk              3.4        3.3             Average Risk              5.0        4.4           2X Average Risk  9.6        7.1          3X Average Risk             23.4       11.0Use the calculated Patient Ratio above and the CHD Risk table to determine the patient's CHD Risk.   . Uric Acid, Serum 05/18/2015 3.5  2.6 - 7.4 mg/dl Final  Hospital Outpatient Visit on 05/18/2015  Component Date Value Ref Range Status  . Bilirubin, Direct 05/18/2015 <0.1* 0.1 - 0.5 mg/dL Final  . Total CK 05/18/2015 87  38 - 234 U/L Final  . Phosphorus 05/18/2015 2.6  2.5 - 4.6 mg/dL Final  Hospital Outpatient Visit on 04/20/2015  Component Date Value Ref Range Status  . Phosphorus 04/20/2015 3.0  2.5 - 4.6 mg/dL Final  . Total CK 04/20/2015 58  38 - 234 U/L Final  . Bilirubin, Direct 04/20/2015 <0.1* 0.1 - 0.5 mg/dL Final  Appointment on 04/20/2015  Component Date Value Ref Range Status  . WBC 04/20/2015 8.3  3.9 - 10.3 10e3/uL Final  . NEUT# 04/20/2015 6.3  1.5 - 6.5 10e3/uL Final  . HGB 04/20/2015 13.9  11.6 - 15.9 g/dL Final  . HCT 04/20/2015 41.7  34.8 - 46.6 % Final  . Platelets 04/20/2015 325  145 - 400 10e3/uL Final  . MCV 04/20/2015 78.5* 79.5 - 101.0 fL Final  . MCH 04/20/2015 26.2  25.1 - 34.0 pg Final  . MCHC 04/20/2015 33.4  31.5 - 36.0 g/dL Final  . RBC 04/20/2015 5.31  3.70 - 5.45 10e6/uL Final  . RDW 04/20/2015 13.9  11.2 - 14.5 %  Final  . lymph# 04/20/2015 1.0  0.9 - 3.3 10e3/uL Final  . MONO# 04/20/2015 0.8  0.1 - 0.9 10e3/uL Final  . Eosinophils Absolute 04/20/2015 0.2  0.0 - 0.5 10e3/uL Final  . Basophils Absolute 04/20/2015 0.0  0.0 - 0.1 10e3/uL Final  . NEUT% 04/20/2015 76.1  38.4 - 76.8 % Final  . LYMPH% 04/20/2015 11.8* 14.0 - 49.7 % Final  . MONO% 04/20/2015 9.7  0.0 - 14.0 % Final  . EOS% 04/20/2015 2.1  0.0 - 7.0 % Final  . BASO% 04/20/2015 0.3  0.0 - 2.0 % Final  . Sodium 04/20/2015 141  136 - 145 mEq/L Final  . Potassium 04/20/2015 4.0  3.5 - 5.1 mEq/L Final  . Chloride 04/20/2015 108  98 - 109 mEq/L Final  . CO2 04/20/2015 23  22 - 29 mEq/L Final  . Glucose 04/20/2015 134  70 - 140 mg/dl Final   Glucose reference range is for nonfasting patients. Fasting glucose reference range is 70- 100.  Marland Kitchen BUN 04/20/2015 14.3  7.0 - 26.0 mg/dL Final  . Creatinine 04/20/2015 1.0  0.6 - 1.1 mg/dL Final  . Total Bilirubin 04/20/2015 0.31  0.20 - 1.20 mg/dL Final  . Alkaline Phosphatase 04/20/2015 276* 40 - 150 U/L Final  . AST 04/20/2015 39* 5 - 34 U/L Final  . ALT 04/20/2015 36  0 - 55 U/L Final  . Total Protein 04/20/2015 8.2  6.4 - 8.3 g/dL Final  . Albumin 04/20/2015 3.5  3.5 - 5.0 g/dL Final  . Calcium 04/20/2015 10.1  8.4 - 10.4 mg/dL Final  . Anion Gap 04/20/2015 10  3 - 11 mEq/L Final  . EGFR 04/20/2015 61* >90 ml/min/1.73 m2 Final   eGFR is calculated using the CKD-EPI Creatinine Equation (2009)  . Cholesterol 04/20/2015 227* 125 - 200 mg/dL Final  .  Triglycerides 04/20/2015 237* <150 mg/dL Final  . HDL 04/20/2015 47  >=46 mg/dL Final  . Total CHOL/HDL Ratio 04/20/2015 4.8  <=5.0 Ratio Final  . VLDL 04/20/2015 47* <30 mg/dL Final  . LDL Cholesterol 04/20/2015 133* <130 mg/dL Final   Comment:  Total Cholesterol/HDL Ratio:CHD Risk                       Coronary Heart Disease Risk Table                                       Men       Women         1/2 Average Risk              3.4        3.3              Average Risk              5.0        4.4           2X Average Risk              9.6        7.1          3X Average Risk             23.4       11.0Use the calculated Patient Ratio above and the CHD Risk table to determine the patient's CHD Risk.   . Uric Acid, Serum 04/20/2015 3.1  2.6 - 7.4 mg/dl Final     X-Rays:Dg Pelvis Portable  06/07/2015  CLINICAL DATA:  Status post left hip replacement EXAM: PORTABLE PELVIS 1-2 VIEWS COMPARISON:  None. FINDINGS: A left hip prosthesis seen. No acute bony abnormality is noted. No dislocation is noted. A surgical drain is noted. No gross soft tissue abnormality is seen. IMPRESSION: Status post left hip replacement Electronically Signed   By: Inez Catalina M.D.   On: 06/07/2015 17:46   Dg C-arm 1-60 Min-no Report  06/07/2015  CLINICAL DATA: hip C-ARM 1-60 MINUTES Fluoroscopy was utilized by the requesting physician.  No radiographic interpretation.    EKG: Orders placed or performed in visit on 01/14/14  . EKG 12-Lead     Hospital Course: Patient was admitted to [facility] and taken to the OR and underwent the above state procedure without complications.  Patient tolerated the procedure well and was later transferred to the recovery room and then to the orthopaedic floor for postoperative care.  They were given PO and IV analgesics for pain control following their surgery.  They were given 24 hours of postoperative antibiotics of  Anti-infectives    Start     Dose/Rate Route Frequency Ordered Stop   06/08/15 0600  ceFAZolin (ANCEF) IVPB 2 g/50 mL premix     2 g 100 mL/hr over 30 Minutes Intravenous On call to O.R. 06/07/15 1319 06/07/15 1558   06/07/15 2200  ceFAZolin (ANCEF) IVPB 2 g/50 mL premix     2 g 100 mL/hr over 30 Minutes Intravenous Every 6 hours 06/07/15 1841 06/08/15 0522     and started on DVT prophylaxis in the form of Xarelto.   PT and OT were ordered for total hip protocol.  The patient was allowed to be WBAT with therapy.  Discharge planning was consulted to  help with postop disposition and equipment needs.  Patient had a decent night on the evening of surgery.  They started to get up OOB with therapy on day one.  Hemovac drain was pulled without difficulty. Patient was seen in rounds on day one by Dr. Wynelle Link and was ready to go home.  Discharge home with home health Diet - Cardiac diet Follow up - in 2 weeks Activity - WBAT Disposition - Home Condition Upon Discharge - Good D/C Meds - See DC Summary DVT Prophylaxis - Xarelto  Discharge Instructions    Call MD / Call 911    Complete by:  As directed   If you experience chest pain or shortness of breath, CALL 911 and be transported to the hospital emergency room.  If you develope a fever above 101 F, pus (white drainage) or increased drainage or redness at the wound, or calf pain, call your surgeon's office.     Change dressing    Complete by:  As directed   You may change your dressing dressing daily with sterile 4 x 4 inch gauze dressing and paper tape.  Do not submerge the incision under water.     Constipation Prevention    Complete by:  As directed   Drink plenty of fluids.  Prune juice may be helpful.  You may use a stool softener, such as Colace (over the counter) 100 mg twice a day.  Use MiraLax (over the counter) for constipation as needed.     Diet - low sodium heart healthy    Complete by:  As directed      Diet Carb Modified    Complete by:  As directed      Discharge instructions    Complete by:  As directed   Pick up stool softner and laxative for home use following surgery while on pain medications. Do not submerge incision under water. Please use good hand washing techniques while changing dressing each day. May shower starting three days after surgery. Please use a clean towel to pat the incision dry following showers. Continue to use ice for pain and swelling after surgery. Do not use any lotions or creams on the incision until  instructed by your surgeon.  Total Hip Protocol.  Take Xarelto for two and a half more weeks, then discontinue Xarelto. Once the patient has completed the blood thinner regimen, then take a Baby 81 mg Aspirin daily for three more weeks.  Postoperative Constipation Protocol  Constipation - defined medically as fewer than three stools per week and severe constipation as less than one stool per week.  One of the most common issues patients have following surgery is constipation.  Even if you have a regular bowel pattern at home, your normal regimen is likely to be disrupted due to multiple reasons following surgery.  Combination of anesthesia, postoperative narcotics, change in appetite and fluid intake all can affect your bowels.  In order to avoid complications following surgery, here are some recommendations in order to help you during your recovery period.  Colace (docusate) - Pick up an over-the-counter form of Colace or another stool softener and take twice a day as long as you are requiring postoperative pain medications.  Take with a full glass of water daily.  If you experience loose stools or diarrhea, hold the colace until you stool forms back up.  If your symptoms do not get better within 1 week or if they get worse, check with your doctor.  Dulcolax (bisacodyl) -  Pick up over-the-counter and take as directed by the product packaging as needed to assist with the movement of your bowels.  Take with a full glass of water.  Use this product as needed if not relieved by Colace only.   MiraLax (polyethylene glycol) - Pick up over-the-counter to have on hand.  MiraLax is a solution that will increase the amount of water in your bowels to assist with bowel movements.  Take as directed and can mix with a glass of water, juice, soda, coffee, or tea.  Take if you go more than two days without a movement. Do not use MiraLax more than once per day. Call your doctor if you are still constipated or  irregular after using this medication for 7 days in a row.  If you continue to have problems with postoperative constipation, please contact the office for further assistance and recommendations.  If you experience "the worst abdominal pain ever" or develop nausea or vomiting, please contact the office immediatly for further recommendations for treatment.     Do not sit on low chairs, stoools or toilet seats, as it may be difficult to get up from low surfaces    Complete by:  As directed      Driving restrictions    Complete by:  As directed   No driving until released by the physician.     Increase activity slowly as tolerated    Complete by:  As directed      Lifting restrictions    Complete by:  As directed   No lifting until released by the physician.     Patient may shower    Complete by:  As directed   You may shower without a dressing once there is no drainage.  Do not wash over the wound.  If drainage remains, do not shower until drainage stops.     TED hose    Complete by:  As directed   Use stockings (TED hose) for 3 weeks on both leg(s).  You may remove them at night for sleeping.     Weight bearing as tolerated    Complete by:  As directed   Laterality:  left  Extremity:  Lower            Medication List    STOP taking these medications        cholecalciferol 1000 UNITS tablet  Commonly known as:  VITAMIN D      TAKE these medications        gemfibrozil 600 MG tablet  Commonly known as:  LOPID  TAKE ONE TABLET BY MOUTH TWICE DAILY BEFORE A MEAL     Investigational everolimus 5 MG tablet Novartis [id number]  Commonly known as:  RAD001  Take 2 tablets by mouth daily. Take with a glass of water.     Investigational everolimus 5 MG tablet Novartis [id number]  Commonly known as:  RAD001  Take 2 tablets by mouth daily. Take with a glass of water.     letrozole 2.5 MG tablet  Commonly known as:  FEMARA  TAKE ONE TABLET BY MOUTH ONCE DAILY      loperamide 2 MG capsule  Commonly known as:  IMODIUM  Take 2 mg by mouth 4 (four) times daily as needed for diarrhea or loose stools.     methocarbamol 500 MG tablet  Commonly known as:  ROBAXIN  Take 1 tablet (500 mg total) by mouth every 6 (six) hours as needed for muscle spasms.  metoprolol tartrate 25 MG tablet  Commonly known as:  LOPRESSOR  Take 0.5 tablets (12.5 mg total) by mouth daily.     omeprazole 40 MG capsule  Commonly known as:  PRILOSEC  Take 1 capsule (40 mg total) by mouth daily as needed.     oxyCODONE 5 MG immediate release tablet  Commonly known as:  Oxy IR/ROXICODONE  Take 1-2 tablets (5-10 mg total) by mouth every 3 (three) hours as needed for moderate pain or severe pain.     prochlorperazine 10 MG tablet  Commonly known as:  COMPAZINE  Take 10 mg by mouth every 6 (six) hours as needed for nausea or vomiting.     rivaroxaban 10 MG Tabs tablet  Commonly known as:  XARELTO  Take 1 tablet (10 mg total) by mouth daily with breakfast. Take Xarelto for two and a half more weeks, then discontinue Xarelto. Once the patient has completed the blood thinner regimen, then take a Baby 81 mg Aspirin daily for three more weeks.     traMADol 50 MG tablet  Commonly known as:  ULTRAM  Take 1-2 tablets (50-100 mg total) by mouth every 6 (six) hours as needed (mild pain).           Follow-up Information    Follow up with [facility].   Why:  home health physical therapy   Contact information:   [place] [address] [phone]       Follow up with [facility].   Why:  rolling walker   Contact information:   [address] [place] [address] [phone]       Follow up with Gearlean Alf, MD. Schedule an appointment as soon as possible for a visit on 06/20/2015.   Specialty:  Orthopedic Surgery   Why:  Call office at [phone] to setup appointment on Tuesday 06/20/2015 with Dr. Wynelle Link.    Contact information:   [address] [place] [address] [phone]       Signed: Arlee Muslim, PA-C Orthopaedic Surgery 06/08/2015, 10:15 AM

## 2015-06-08 NOTE — Evaluation (Signed)
Occupational Therapy Evaluation Patient Details Name: Barbara Parks MRN: [id number] DOB: 03/24/1951 Today's Date: 06/08/2015    History of Present Illness 64 yo female s/p LEFT TOTAL HIP ARTHROPLASTY ANTERIOR APPROACH (no precautions)    Clinical Impression   Patient admitted with above. Patient independent PTA. Patient currently functioning at an overall mod I level.  No additional OT needs identified, D/C from acute OT services and no additional follow-up OT needs at this time. All appropriate education provided to patient. Please re-order OT if needed.       Follow Up Recommendations  No OT follow up;Supervision - Intermittent    Equipment Recommendations  None recommended by OT    Recommendations for Other Services  None at this time    Precautions / Restrictions Precautions Precautions: Fall Restrictions Weight Bearing Restrictions: Yes LLE Weight Bearing: Weight bearing as tolerated    Mobility Bed Mobility  General bed mobility comments: Pt found seated in recliner upon OT entering/exiting room  Transfers Overall transfer level: Modified independent Equipment used: Rolling walker (2 wheeled)    Balance Overall balance assessment: Modified Independent;No apparent balance deficits (not formally assessed)    ADL Overall ADL's : Modified independent   General ADL Comments: Pt overall mod I with functional mobility, transfers and ADL. Pt states she has AE (reacher, sock aid, LH sponge, and LH shoe horn at home). Pt ambulated to and from BR with no cues needed for safety with RW. Educated pt on safe & effective tub/shower transfers. Pt states she plans to sponge bath for awhile before attempting showers.     Pertinent Vitals/Pain Pain Assessment: 0-10 ("pretty good") Pain Score: 2  ("less than a 2") Pain Descriptors / Indicators: Aching;Discomfort Pain Intervention(s): Monitored during session     Hand Dominance Right   Extremity/Trunk Assessment Upper  Extremity Assessment Upper Extremity Assessment: Overall WFL for tasks assessed   Lower Extremity Assessment Lower Extremity Assessment: Defer to PT evaluation   Cervical / Trunk Assessment Cervical / Trunk Assessment: Normal   Communication Communication Communication: No difficulties   Cognition Arousal/Alertness: Awake/alert Behavior During Therapy: WFL for tasks assessed/performed Overall Cognitive Status: Within Functional Limits for tasks assessed              Home Living Family/patient expects to be discharged to:: Private residence Living Arrangements: Alone Available Help at Discharge: Family;Available 24 hours/day (sister in from out of town) Type of Home: House Home Access: Stairs to enter [employer] of Steps: front-3; side-4 (wider and not as steep as front steps) Entrance Stairs-Rails: Left Home Layout: One level     Bathroom Shower/Tub: Tub/shower unit;Door   [employer] Toilet: Handicapped height     Home Equipment: Toilet riser    Prior Functioning/Environment Level of Independence: Independent      OT Diagnosis: Generalized weakness   OT Problem List:  n/a, no acute OT needs identified    OT Treatment/Interventions:   n/a, no acute OT needs identified    OT Goals(Current goals can be found in the care plan section) Acute Rehab OT Goals Patient Stated Goal: go home today OT Goal Formulation: All assessment and education complete, DC therapy  OT Frequency:  n/a, no acute OT needs identified    Barriers to D/C:  None known at this time    End of Session Equipment Utilized During Treatment: Rolling walker  Activity Tolerance: Patient tolerated treatment well Patient left: in chair;with call bell/phone within reach;with chair alarm set   Time: 1134-1150 OT Time Calculation (  min): 16 min Charges:  OT General Charges $OT Visit: 1 Procedure OT Evaluation $Initial OT Evaluation Tier I: 1 Procedure  Snyder Colavito , MS, OTR/L,  CLT Pager: [phone]  06/08/2015, 11:58 AM

## 2015-06-10 DIAGNOSIS — M5136 Other intervertebral disc degeneration, lumbar region: Secondary | ICD-10-CM | POA: Diagnosis not present

## 2015-06-10 DIAGNOSIS — Z471 Aftercare following joint replacement surgery: Secondary | ICD-10-CM | POA: Diagnosis not present

## 2015-06-10 DIAGNOSIS — M1990 Unspecified osteoarthritis, unspecified site: Secondary | ICD-10-CM | POA: Diagnosis not present

## 2015-06-10 DIAGNOSIS — C7800 Secondary malignant neoplasm of unspecified lung: Secondary | ICD-10-CM | POA: Diagnosis not present

## 2015-06-10 DIAGNOSIS — G8929 Other chronic pain: Secondary | ICD-10-CM | POA: Diagnosis not present

## 2015-06-10 DIAGNOSIS — C50911 Malignant neoplasm of unspecified site of right female breast: Secondary | ICD-10-CM | POA: Diagnosis not present

## 2015-06-12 ENCOUNTER — Encounter: Payer: Self-pay | Admitting: Oncology

## 2015-06-12 DIAGNOSIS — Z471 Aftercare following joint replacement surgery: Secondary | ICD-10-CM | POA: Diagnosis not present

## 2015-06-12 DIAGNOSIS — C50911 Malignant neoplasm of unspecified site of right female breast: Secondary | ICD-10-CM | POA: Diagnosis not present

## 2015-06-12 DIAGNOSIS — M5136 Other intervertebral disc degeneration, lumbar region: Secondary | ICD-10-CM | POA: Diagnosis not present

## 2015-06-12 DIAGNOSIS — M1990 Unspecified osteoarthritis, unspecified site: Secondary | ICD-10-CM | POA: Diagnosis not present

## 2015-06-12 DIAGNOSIS — G8929 Other chronic pain: Secondary | ICD-10-CM | POA: Diagnosis not present

## 2015-06-12 DIAGNOSIS — C7800 Secondary malignant neoplasm of unspecified lung: Secondary | ICD-10-CM | POA: Diagnosis not present

## 2015-06-13 ENCOUNTER — Encounter (HOSPITAL_COMMUNITY): Payer: Self-pay

## 2015-06-13 ENCOUNTER — Ambulatory Visit (HOSPITAL_COMMUNITY)
Admission: RE | Admit: 2015-06-13 | Discharge: 2015-06-13 | Disposition: A | Discharge status: Home / Self Care | Payer: Medicare Other | Source: Ambulatory Visit | Attending: Oncology | Admitting: Oncology

## 2015-06-13 DIAGNOSIS — M899 Disorder of bone, unspecified: Secondary | ICD-10-CM | POA: Insufficient documentation

## 2015-06-13 DIAGNOSIS — C7800 Secondary malignant neoplasm of unspecified lung: Secondary | ICD-10-CM | POA: Insufficient documentation

## 2015-06-13 DIAGNOSIS — C50911 Malignant neoplasm of unspecified site of right female breast: Secondary | ICD-10-CM

## 2015-06-13 DIAGNOSIS — R911 Solitary pulmonary nodule: Secondary | ICD-10-CM | POA: Diagnosis not present

## 2015-06-13 DIAGNOSIS — C50919 Malignant neoplasm of unspecified site of unspecified female breast: Secondary | ICD-10-CM | POA: Diagnosis not present

## 2015-06-13 MED ORDER — IOHEXOL 300 MG/ML  SOLN
100.0000 mL | Freq: Once | INTRAMUSCULAR | Status: AC | PRN
  Administered 2015-06-13: 100 mL via INTRAVENOUS

## 2015-06-13 MED ORDER — IOHEXOL 300 MG/ML  SOLN
50.0000 mL | Freq: Once | INTRAMUSCULAR | Status: AC | PRN
  Administered 2015-06-13: 50 mL via ORAL

## 2015-06-14 DIAGNOSIS — M1990 Unspecified osteoarthritis, unspecified site: Secondary | ICD-10-CM | POA: Diagnosis not present

## 2015-06-14 DIAGNOSIS — C7800 Secondary malignant neoplasm of unspecified lung: Secondary | ICD-10-CM | POA: Diagnosis not present

## 2015-06-14 DIAGNOSIS — M5136 Other intervertebral disc degeneration, lumbar region: Secondary | ICD-10-CM | POA: Diagnosis not present

## 2015-06-14 DIAGNOSIS — G8929 Other chronic pain: Secondary | ICD-10-CM | POA: Diagnosis not present

## 2015-06-14 DIAGNOSIS — Z471 Aftercare following joint replacement surgery: Secondary | ICD-10-CM | POA: Diagnosis not present

## 2015-06-14 DIAGNOSIS — C50911 Malignant neoplasm of unspecified site of right female breast: Secondary | ICD-10-CM | POA: Diagnosis not present

## 2015-06-14 IMAGING — CT CT CHEST W/ CM
2 of 4 series · 15 of 46 positions shown, 17 images · IV contrast (OMNIPAQUE)
Comparison: 10/05/2013

CLINICAL DATA: Recurrent right breast cancer, status post right
mastectomy, intravenous chemotherapy complete, oral chemotherapy
ongoing

EXAM:
CT CHEST, ABDOMEN, AND PELVIS WITH CONTRAST
TECHNIQUE: Multidetector CT imaging of the chest, abdomen and pelvis was
performed following the standard protocol during bolus
administration of intravenous contrast.
CONTRAST:  100mL OMNIPAQUE IOHEXOL 300 MG/ML  SOLN

[Series 2: cap with st · axial · 0.73mm/px · z∈[-545,+10]mm · 12 of 123 slices shown, 14 images]
[im 6/123  soft-tissue]
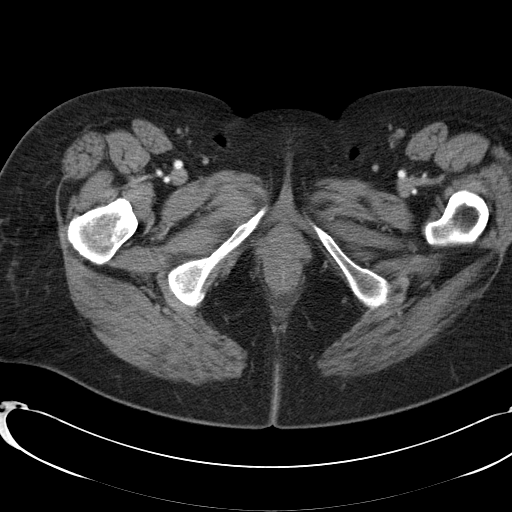
[im 6/123  bone]
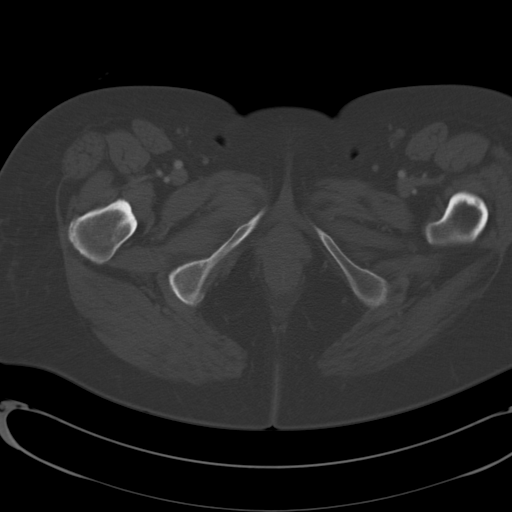
[im 16/123  soft-tissue]
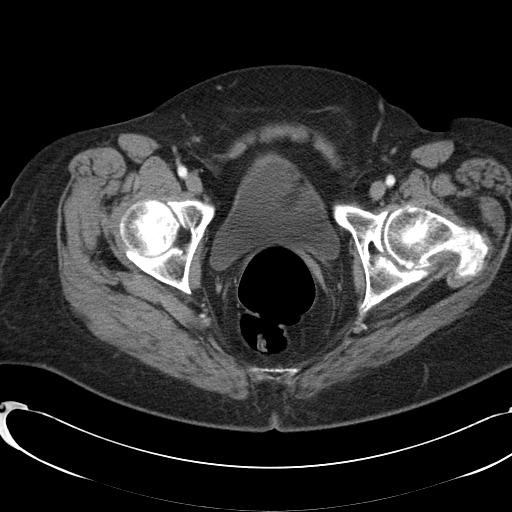
[im 26/123  soft-tissue]
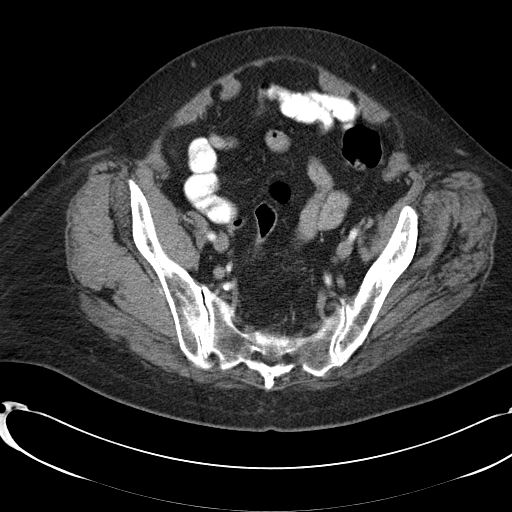
[im 36/123  soft-tissue]
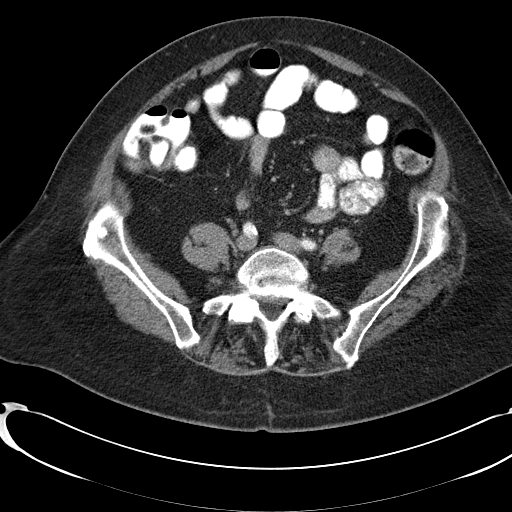
[im 46/123  soft-tissue]
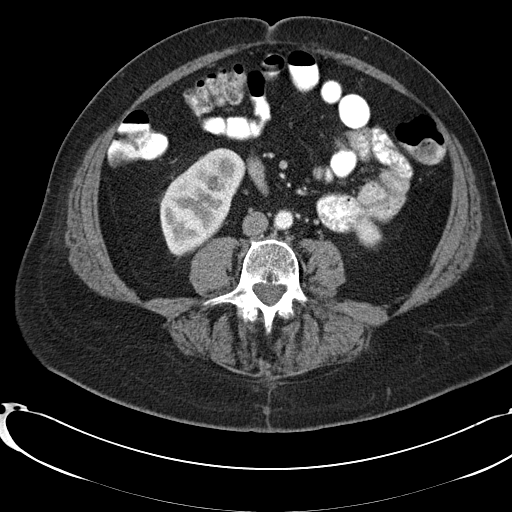
[im 56/123  soft-tissue]
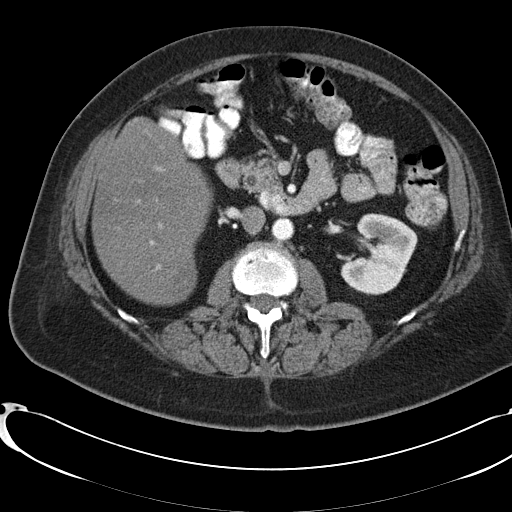
[im 67/123  soft-tissue]
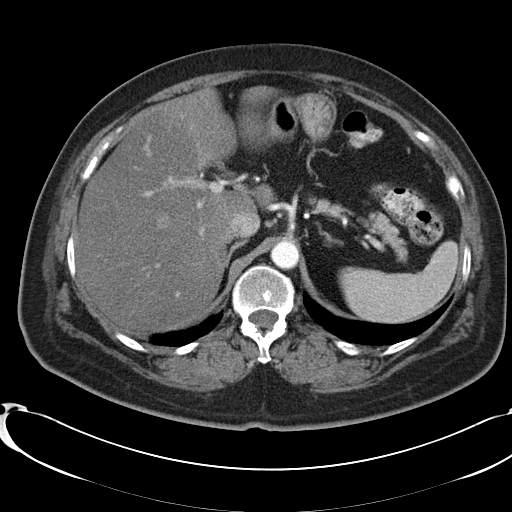
[im 77/123  soft-tissue]
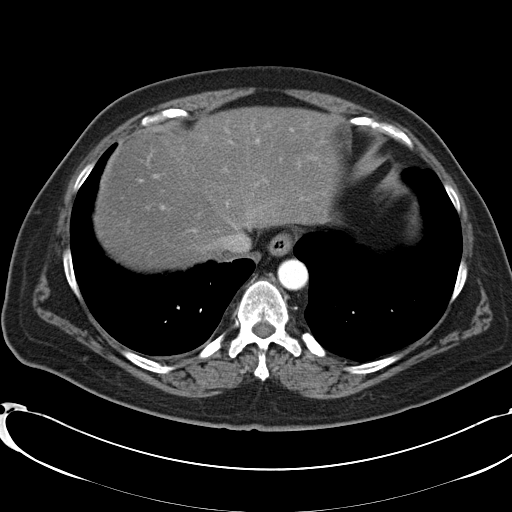
[im 87/123  soft-tissue]
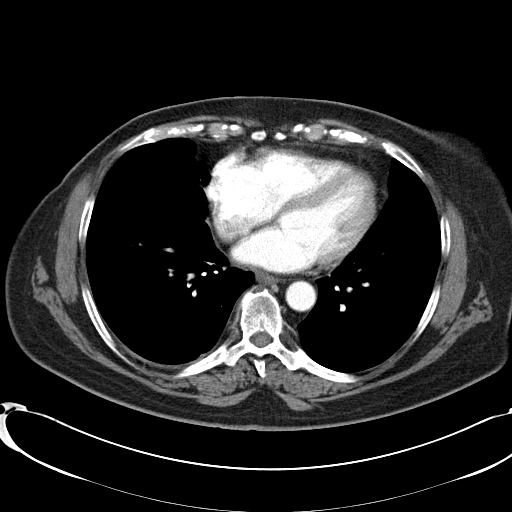
[im 87/123  bone]
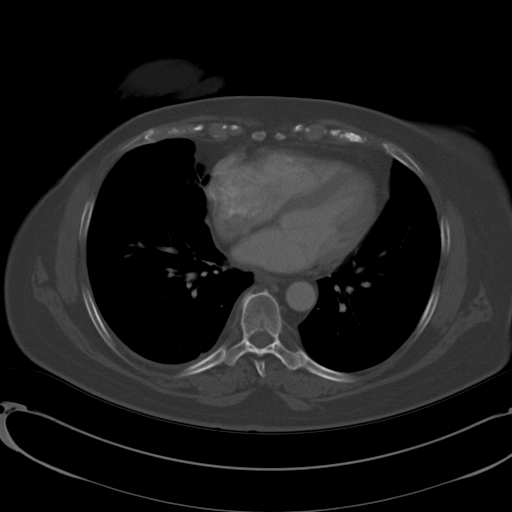
[im 97/123  soft-tissue]
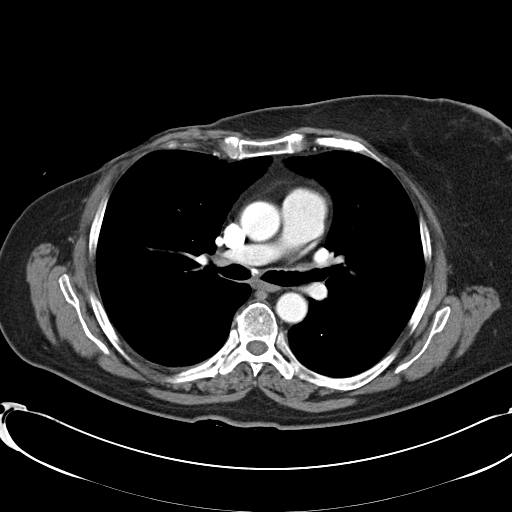
[im 107/123  soft-tissue]
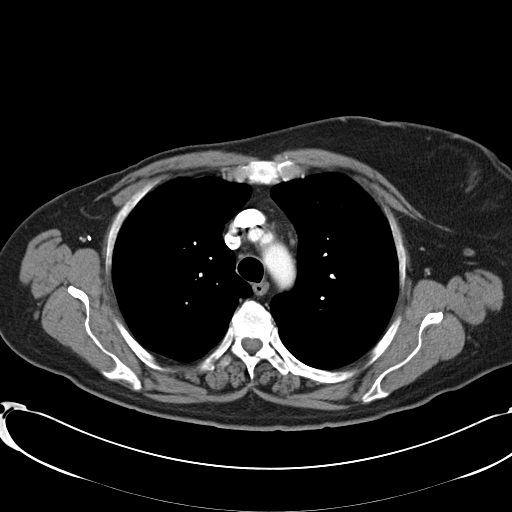
[im 117/123  soft-tissue]
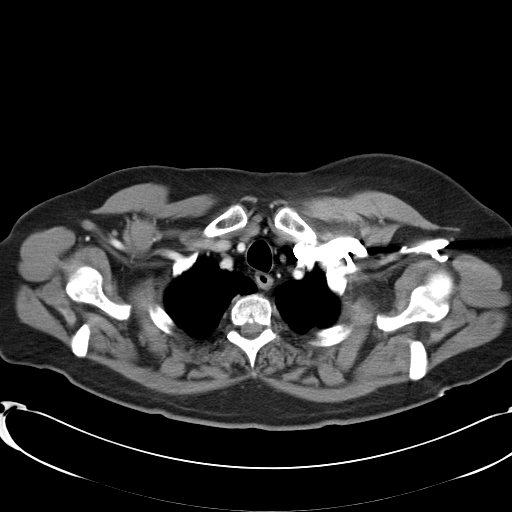

[Series 602: <mpr thick range> · coronal · 1.20mm/px · 3 of 115 slices shown]
[im 39/115  soft-tissue]
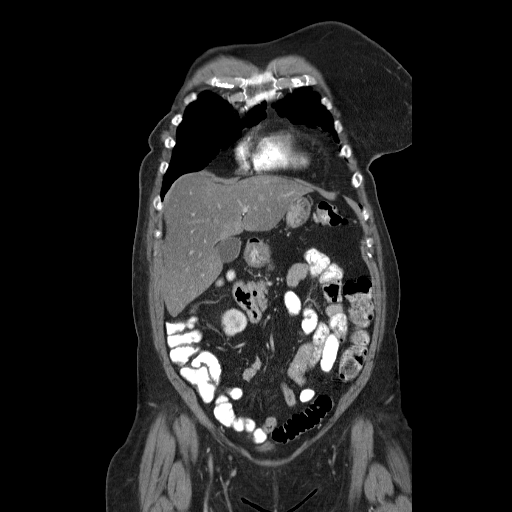
[im 51/115  soft-tissue]
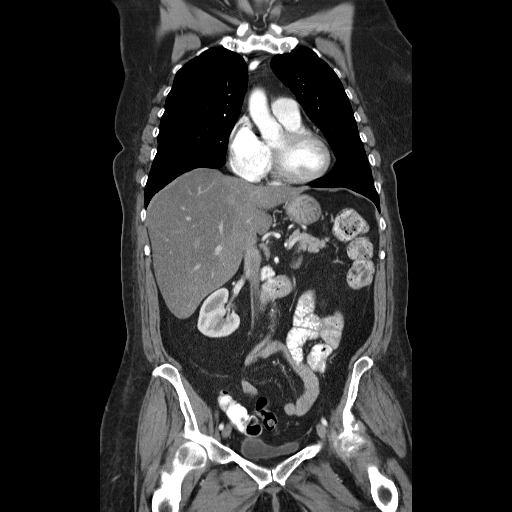
[im 64/115  soft-tissue]
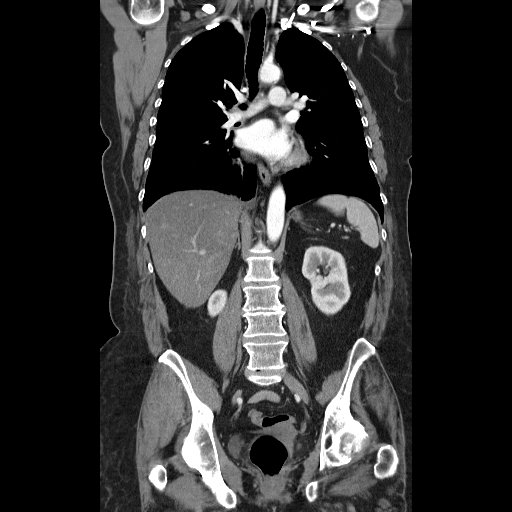

[15 of 46 positions shown; findings below may reference images not displayed]

RECIST

Target Lesions:

1. Right lower lobe lung nodule measures 6 mm (series 5/ image 40),
unchanged
2. Subcarinal lymph node measures 7 mm short axis (series 2/image
29), unchanged
3. Right hilar lymph node measures 5 mm short axis (series 2/ image
27), previously 7 mm
Non-target Lesions:

1. Small right pleural effusion has resolved
2. Superior segment right lower lobe lung nodule is not visualized
FINDINGS: CT CHEST FINDINGS

Mild right lower lobe scarring with associated 6 mm subpleural
nodule (series 5/ image 40), unchanged. Faint subpleural nodularity
in the lateral right lung apex (series 5/image 8), likely post
infectious/inflammatory. No new/suspicious pulmonary nodules. No
pleural effusion or pneumothorax.

Visualized thyroid is unremarkable.

The heart is top-normal in size. No pericardial effusion. Mild
atherosclerotic calcifications of the aortic arch.

Small thoracic lymph nodes, within normal limits, including:

--6 mm short axis right supraclavicular node (series 2/ image 3),
unchanged

--7 mm short axis subcarinal node (series 2/ image 29), partially
calcified, unchanged

--5 mm short axis right hilar node (series 2/ image 27), previously
7 mm

Status post right mastectomy with right axillary lymph node
dissection.

Degenerative changes of the thoracic spine.

CT ABDOMEN AND PELVIS FINDINGS

Liver is notable for moderate hepatic steatosis. Ovoid hypervascular
lesion in the posterior segment right hepatic dome (series 2/image
24), possibly reflecting a perfusion anomaly or focal fatty sparing,
unchanged. Focal fatty sparing along the gallbladder fossa.

Spleen, pancreas,, and adrenal glands are within normal limits.

Suspected gallbladder adenomyomatosis. No intrahepatic or
extrahepatic ductal dilatation.

Kidneys are within normal limits.  No hydronephrosis.

No evidence of bowel obstruction. Normal appendix. Colonic
diverticulosis, without associated inflammatory changes.

Atherosclerotic calcifications of the abdominal aorta and branch
vessels.

No abdominopelvic ascites.

No suspicious abdominopelvic lymphadenopathy.

Uterus and bilateral ovaries are unremarkable.

Bladder is within normal limits.

Degenerative changes involving the lumbar spine, most prominent at
L3-4. Degenerative changes involving the left hip.

Suspected sclerotic metastases involving the left iliac wing (series
2/image 22), left iliac bone/ acetabulum (series 2/image 98), and
left sacrum (series 2/image 25), unchanged.
IMPRESSION: Status post right mastectomy and right axillary lymph node
dissection.

Stable sclerotic osseous metastases in the pelvis, as described
above.

No evidence of new/progressive metastatic disease in the chest,
abdomen, or pelvis.

RECIST 1.0 measurements as above.

## 2015-06-15 ENCOUNTER — Other Ambulatory Visit: Payer: Self-pay | Admitting: *Deleted

## 2015-06-15 ENCOUNTER — Other Ambulatory Visit (HOSPITAL_COMMUNITY)
Admission: AD | Admit: 2015-06-15 | Discharge: 2015-06-15 | Disposition: A | Discharge status: Home / Self Care | Payer: Medicare Other | Source: Ambulatory Visit | Attending: Oncology | Admitting: Oncology

## 2015-06-15 ENCOUNTER — Ambulatory Visit (HOSPITAL_BASED_OUTPATIENT_CLINIC_OR_DEPARTMENT_OTHER): Payer: Medicare Other | Admitting: Oncology

## 2015-06-15 ENCOUNTER — Encounter: Payer: Self-pay | Admitting: *Deleted

## 2015-06-15 ENCOUNTER — Other Ambulatory Visit (HOSPITAL_BASED_OUTPATIENT_CLINIC_OR_DEPARTMENT_OTHER): Payer: Medicare Other

## 2015-06-15 VITALS — BP 132/68 | HR 115 | Temp 97.6°F | Resp 18 | Ht 64.0 in | Wt 176.0 lb

## 2015-06-15 DIAGNOSIS — C7989 Secondary malignant neoplasm of other specified sites: Secondary | ICD-10-CM | POA: Diagnosis not present

## 2015-06-15 DIAGNOSIS — C7800 Secondary malignant neoplasm of unspecified lung: Secondary | ICD-10-CM

## 2015-06-15 DIAGNOSIS — Z006 Encounter for examination for normal comparison and control in clinical research program: Secondary | ICD-10-CM

## 2015-06-15 DIAGNOSIS — C50911 Malignant neoplasm of unspecified site of right female breast: Secondary | ICD-10-CM

## 2015-06-15 DIAGNOSIS — C50411 Malignant neoplasm of upper-outer quadrant of right female breast: Secondary | ICD-10-CM | POA: Insufficient documentation

## 2015-06-15 LAB — LIPID PANEL
CHOL/HDL RATIO: 3.9 ratio (ref <=5.0)
CHOLESTEROL: 223 mg/dL — AB (ref 125–200)
HDL: 57 mg/dL (ref >=46)
LDL Cholesterol: 131 mg/dL — ABNORMAL HIGH (ref <130)
Triglycerides: 175 mg/dL — ABNORMAL HIGH (ref <150)
VLDL: 35 mg/dL — ABNORMAL HIGH (ref <30)

## 2015-06-15 LAB — COMPREHENSIVE METABOLIC PANEL (CC13)
ALBUMIN: 2.8 g/dL — AB (ref 3.5–5.0)
ALK PHOS: 250 U/L — AB (ref 40–150)
ALT: 35 U/L (ref 0–55)
AST: 48 U/L — ABNORMAL HIGH (ref 5–34)
Anion Gap: 12 mEq/L — ABNORMAL HIGH (ref 3–11)
BUN: 13.9 mg/dL (ref 7.0–26.0)
CALCIUM: 10.3 mg/dL (ref 8.4–10.4)
CO2: 25 mEq/L (ref 22–29)
Chloride: 97 mEq/L — ABNORMAL LOW (ref 98–109)
Creatinine: 1 mg/dL (ref 0.6–1.1)
EGFR: 59 mL/min/{1.73_m2} — AB (ref >90)
Glucose: 146 mg/dl — ABNORMAL HIGH (ref 70–140)
POTASSIUM: 3.9 meq/L (ref 3.5–5.1)
Sodium: 134 mEq/L — ABNORMAL LOW (ref 136–145)
Total Bilirubin: 0.43 mg/dL (ref 0.20–1.20)
Total Protein: 7.7 g/dL (ref 6.4–8.3)

## 2015-06-15 LAB — CBC WITH DIFFERENTIAL/PLATELET
BASO%: 0.2 % (ref 0.0–2.0)
BASOS ABS: 0 10*3/uL (ref 0.0–0.1)
EOS ABS: 0.2 10*3/uL (ref 0.0–0.5)
EOS%: 1.2 % (ref 0.0–7.0)
HEMATOCRIT: 33.9 % — AB (ref 34.8–46.6)
HEMOGLOBIN: 11.1 g/dL — AB (ref 11.6–15.9)
LYMPH#: 0.9 10*3/uL (ref 0.9–3.3)
LYMPH%: 6.2 % — ABNORMAL LOW (ref 14.0–49.7)
MCH: 24.9 pg — AB (ref 25.1–34.0)
MCHC: 32.9 g/dL (ref 31.5–36.0)
MCV: 75.6 fL — AB (ref 79.5–101.0)
MONO#: 1.5 10*3/uL — ABNORMAL HIGH (ref 0.1–0.9)
MONO%: 10.1 % (ref 0.0–14.0)
NEUT#: 12.4 10*3/uL — ABNORMAL HIGH (ref 1.5–6.5)
NEUT%: 82.3 % — ABNORMAL HIGH (ref 38.4–76.8)
PLATELETS: 542 10*3/uL — AB (ref 145–400)
RBC: 4.48 10*6/uL (ref 3.70–5.45)
RDW: 13.4 % (ref 11.2–14.5)
WBC: 15.1 10*3/uL — ABNORMAL HIGH (ref 3.9–10.3)

## 2015-06-15 LAB — BILIRUBIN, DIRECT: Bilirubin, Direct: 0.2 mg/dL (ref 0.1–0.5)

## 2015-06-15 LAB — PHOSPHORUS: PHOSPHORUS: 2.5 mg/dL (ref 2.5–4.6)

## 2015-06-15 LAB — CK: Total CK: 250 U/L — ABNORMAL HIGH (ref 38–234)

## 2015-06-15 LAB — URIC ACID (CC13): Uric Acid, Serum: 3.6 mg/dl (ref 2.6–7.4)

## 2015-06-15 MED ORDER — INV-EVEROLIMUS (RAD0001) 5MG TABLET NOVARTIS CRAD001Y24135: 2.0000 | ORAL_TABLET | Freq: Every day | ORAL | Status: DC

## 2015-06-15 NOTE — Progress Notes (Signed)
[facility]  [phone] [phone]     ID: Barbara Parks DOB: [birth year]  MR#: [id number]  KVQ#:[id number]  Patient Care Team: Darcus Austin, MD as PCP - General (Family Medicine) Jerline Pain, MD (Cardiology) Gaye Pollack, MD (Cardiothoracic Surgery) Chauncey Cruel, MD as Consulting Physician (Oncology) OTHER MD: Dr Erroll Luna- Merlene Laughter, DDS; Gaynelle Arabian MD  CHIEF COMPLAINT: metastatic breast cancer, on BOLERO study  CURRENT TREATMENT: letrozole + everolimus  BREAST CANCER HISTORY: From Dr. Collier Salina Parks's original intake note 04/13/2004:  "[name] has been in good health all of her life. She recently moved from [place] to work here.  She palpated a mass at about the 12 o'clock position in mid-July. She has not noticed any nipple retraction or skin changes.  She was seen by her primary care doctor who subsequently referred her for a mammogram.  Mammogram was performed on 03/01/04. This demonstrated a spiculated 2 cm mass at the 12 o'clock position in the right breast.  Upper outer quadrant of the left breast shows some distortion.   Physical exam at that time showed a firm, nontender nodule at the 12 o'clock position in the right breast, 5 cm from the nipple.  Ultrasound of this area showed a hypoechoic ill-defined mass, measuring 2.2 x 1.3 x 1.4 cm.  Physical exam of the left breast showed general vague thickening, upper outer quadrant of the left breast with a discrete palpable mass. The ultrasound performed showed a single hypoechoic ill-defined nodule at the 1 o'clock position, measuring 7 x 5 x 6 mm.  She had biopsies of both lesions on 03/02/03.  Needle core biopsy of the lesion on the right breast revealed invasive mammary carcinoma.  Needle core biopsy of the left breast showed a complex fibroadenoma.  Prognostic panel of the lesion on the right breast showed it to be ER positive at 73%, PR positive at 90% and proliferative index 9%, HER-2  was 1+.  Patient was referred to Dr. Annamaria Boots, who performed a simple mastectomy with sentinel lymph node evaluation on 03/22/04.  Final pathology showed this to be an invasive ductal carcinoma  with lobular features, measuring 2.2 cm, grade 2 of 3.  Margins negative for carcinoma.  Invasive ductal carcinoma was extended to involve deep dermis of the nipple.  Lymphovascular invasion was identified.  Total of 4 sentinel lymph nodes were evaluated.  Touch imprints at the time of the OR was felt to be negative.  Subsequent evaluation showed a 5 mm focus of metastatic carcinoma in one of the four lymph nodes on microscopic after sectioning.  There was extracapsular extension  of one lymph node as well. The remaining three lymph nodes were all negative."  The patient's subsequent history is detailed below.  INTERVAL HISTORY: Barbara Parks returns today for follow up of her metastatic estrogen receptor positive breast cancer. Her research nurse Barbara Parks was also present, as was the patient's sister Barbara Parks. Since the last visit here Barbara Parks underwent left hip replacement. She did well with the surgery, but is having problems with the physical therapy portion. She tells me the physical therapist, and at, is very rough and on sympathetic. She intends to discuss this in detail with her surgeon Dr. Christian Mate trial and she is currently cycle 49 of letrozole and ever Lyme [place]. We are following her closely with her most recent scan 06/13/2015 showing stable disease. More specifically we are following some lung nodules the largest of which  measures 0.6 cm. There is some stable sclerotic lesions in the left sacrum and left iliac bone, consistent with treated metastases. In short, there is no evidence of disease progression.  She is tolerating the letrozole and ever Lyme [place] without any significant side effects.  REVIEW OF SYSTEMS: Barbara Parks is using a walker at present. She is on prophylactic Rivaroxaban without any  bleeding complications. Aside from postoperative concerns, just noted, a detailed review of systems today was entirely negative.   PAST MEDICAL HISTORY: Past Medical History  Diagnosis Date  . Shortness of breath   . Arthritis     left hip  . Osteopenia due to cancer therapy 09/09/2013  . Hypercholesteremia   . breast ca 2005    breast/chemo R mastectomy  . Metastasis to lung ([place]) dx'd 08/2011  . GERD (gastroesophageal reflux disease)     PAST SURGICAL HISTORY: Past Surgical History  Procedure Laterality Date  . Spine surgery  1996  . Breast surgery  2005    right  . Video bronchoscopy  09/11/2011    Procedure: VIDEO BRONCHOSCOPY;  Surgeon: Gaye Pollack, MD;  Location: [facility];  Service: Thoracic;  Laterality: N/A;  . Chest tube insertion  09/11/2011    Procedure: INSERTION PLEURAL DRAINAGE CATHETER;  Surgeon: Gaye Pollack, MD;  Location: [facility];  Service: Thoracic;  Laterality: Right;  . Pericardial window  09/11/2011    Procedure: PERICARDIAL WINDOW;  Surgeon: Gaye Pollack, MD;  Location: [facility];  Service: Thoracic;  Laterality: N/A;  . Removal of pleural drainage catheter  12/19/2011    Procedure: REMOVAL OF PLEURAL DRAINAGE CATHETER;  Surgeon: Gaye Pollack, MD;  Location: [facility];  Service: Thoracic;  Laterality: Right;  TO BE DONE IN MINOR ROOM, SHORT STAY  . Cataract extraction w/phaco Left 01/18/2014    Procedure: CATARACT EXTRACTION PHACO AND INTRAOCULAR LENS PLACEMENT (IOC);  Surgeon: Elta Guadeloupe T. Gershon Crane, MD;  Location: [facility];  Service: Ophthalmology;  Laterality: Left;  CDE 15.79  . Cataract extraction w/phaco Right 02/08/2014    Procedure: CATARACT EXTRACTION PHACO AND INTRAOCULAR LENS PLACEMENT (IOC);  Surgeon: Elta Guadeloupe T. Gershon Crane, MD;  Location: [facility];  Service: Ophthalmology;  Laterality: Right;  CDE 4.41  . Total hip arthroplasty Left 06/07/2015    Procedure: LEFT TOTAL HIP ARTHROPLASTY ANTERIOR APPROACH;  Surgeon: Gaynelle Arabian, MD;  Location: [facility];  Service: Orthopedics;   Laterality: Left;    FAMILY HISTORY Family History  Problem Relation Age of Onset  . Cancer Mother     breast  . Heart disease Father   . Anesthesia problems Neg Hx    the patient's mother was diagnosed with breast cancer at age 57, she died age 31 from congestive heart failure..The patient's father died from heart disease at age 71.  She has one sister alive & well.  Two brothers alive & well, one with diabetes. The patient's sister was also diagnosed with breast cancer, and was tested for the BRCA gene, and was negative. The patient herself has not been tested. There is no history of ovarian cancer in the family  GYNECOLOGIC HISTORY:  No LMP recorded. Patient is postmenopausal. Menarche age 9, the patient is GX P0. She stopped having periods with chemotherapy in 2005. 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 She was a Archivist. She is single, lives alone with her Shitzu-poodle Sammie.. Family  is all in the [place] area.     ADVANCED DIRECTIVES: Not in place. At the 02/23/2014 visit the patient was given the appropriate documents to complete and notarize at her discretion. She tells me she is planning to name her sister, Billie Ruddy, as healthcare power of attorney. Barbara Parks can be reached at [phone]   HEALTH MAINTENANCE: Social History  Substance Use Topics  . Smoking status: Former Smoker -- 1.50 packs/day for 30 years    Types: Cigarettes    Quit date: 09/10/2007  . Smokeless tobacco: Not on file  . Alcohol Use: No     Colonoscopy:  PAP:  Bone density: 08/11/2013, showed osteopenia  Lipid panel:  Allergies  Allergen Reactions  . Aspirin     REACTION: upset stomach  . Latex Other (See Comments)    Blistering and skin peels off  . Nsaids Nausea And Vomiting    Extreme nausea and vomiting    Current Outpatient Prescriptions  Medication Sig  Dispense Refill  . gemfibrozil (LOPID) 600 MG tablet TAKE ONE TABLET BY MOUTH TWICE DAILY BEFORE A MEAL 60 tablet 0  . Investigational everolimus (RAD001) 5 MG tablet Novartis [id number] Take 2 tablets by mouth daily. Take with a glass of water. 70 tablet 0  . Investigational everolimus (RAD001) 5 MG tablet Novartis [id number] Take 2 tablets by mouth daily. Take with a glass of water. 70 tablet 0  . Investigational everolimus (RAD001) 5 MG tablet Novartis [id number] Take 2 tablets by mouth daily. Take with a glass of water. 70 tablet 0  . letrozole (FEMARA) 2.5 MG tablet TAKE ONE TABLET BY MOUTH ONCE DAILY 90 tablet 0  . loperamide (IMODIUM) 2 MG capsule Take 2 mg by mouth 4 (four) times daily as needed for diarrhea or loose stools.    . methocarbamol (ROBAXIN) 500 MG tablet Take 1 tablet (500 mg total) by mouth every 6 (six) hours as needed for muscle spasms. 90 tablet 0  . metoprolol tartrate (LOPRESSOR) 25 MG tablet Take 0.5 tablets (12.5 mg total) by mouth daily. 30 tablet 4  . omeprazole (PRILOSEC) 40 MG capsule Take 1 capsule (40 mg total) by mouth daily as needed. (Patient taking differently: Take 40 mg by mouth daily. ) 90 capsule 3  . oxyCODONE (OXY IR/ROXICODONE) 5 MG immediate release tablet Take 1-2 tablets (5-10 mg total) by mouth every 3 (three) hours as needed for moderate pain or severe pain. 90 tablet 0  . prochlorperazine (COMPAZINE) 10 MG tablet Take 10 mg by mouth every 6 (six) hours as needed for nausea or vomiting.    . rivaroxaban (XARELTO) 10 MG TABS tablet Take 1 tablet (10 mg total) by mouth daily with breakfast. Take Xarelto for two and a half more weeks, then discontinue Xarelto. Once the patient has completed the blood thinner regimen, then take a Baby 81 mg Aspirin daily for three more weeks. 20 tablet 0  . traMADol (ULTRAM) 50 MG tablet Take 1-2 tablets (50-100 mg total) by mouth every 6 (six) hours as needed (mild pain). 80 tablet 1   No current  facility-administered medications for this visit.    OBJECTIVE: Middle-aged white Parks using a walker Filed Vitals:   06/15/15 1310  BP: 132/68  Pulse:   Temp:   Resp:      Body mass index is 30.2 kg/(m^2).    ECOG FS:1 - Symptomatic but completely ambulatory  Sclerae unicteric, pupils round and equal Oropharynx clear and moist-- no thrush or other lesions No cervical  or supraclavicular adenopathy Lungs no rales or rhonchi Heart regular rate and rhythm Abd soft, nontender, positive bowel sounds MSK status post recent left total hip replacement as noted Neuro: nonfocal, well oriented, positive affect Breasts: The right breast is status post mastectomy. There is no evidence of chest wall recurrence. The right axilla is benign. The left breast is unremarkable.  LAB RESULTS:  CMP     Component Value Date/Time   NA 134* 06/15/2015 1213   NA 131* 06/08/2015 0526   K 3.9 06/15/2015 1213   K 4.0 06/08/2015 0526   CL 100* 06/08/2015 0526   CL 109* 12/31/2012 0940   CO2 25 06/15/2015 1213   CO2 21* 06/08/2015 0526   GLUCOSE 146* 06/15/2015 1213   GLUCOSE 339* 06/08/2015 0526   GLUCOSE 127* 12/31/2012 0940   BUN 13.9 06/15/2015 1213   BUN 17 06/08/2015 0526   CREATININE 1.0 06/15/2015 1213   CREATININE 1.07* 06/08/2015 0526   CALCIUM 10.3 06/15/2015 1213   CALCIUM 8.7* 06/08/2015 0526   PROT 7.7 06/15/2015 1213   PROT 7.4 05/29/2015 1415   ALBUMIN 2.8* 06/15/2015 1213   ALBUMIN 3.6 05/29/2015 1415   AST 48* 06/15/2015 1213   AST 51* 05/29/2015 1415   ALT 35 06/15/2015 1213   ALT 39 05/29/2015 1415   ALKPHOS 250* 06/15/2015 1213   ALKPHOS 203* 05/29/2015 1415   BILITOT 0.43 06/15/2015 1213   BILITOT 0.3 05/29/2015 1415   GFRNONAA 54* 06/08/2015 0526   GFRAA >60 06/08/2015 0526    I No results found for: SPEP  Lab Results  Component Value Date   WBC 15.1* 06/15/2015   NEUTROABS 12.4* 06/15/2015   HGB 11.1* 06/15/2015   HCT 33.9* 06/15/2015   MCV 75.6*  06/15/2015   PLT 542* 06/15/2015      Chemistry      Component Value Date/Time   NA 134* 06/15/2015 1213   NA 131* 06/08/2015 0526   K 3.9 06/15/2015 1213   K 4.0 06/08/2015 0526   CL 100* 06/08/2015 0526   CL 109* 12/31/2012 0940   CO2 25 06/15/2015 1213   CO2 21* 06/08/2015 0526   BUN 13.9 06/15/2015 1213   BUN 17 06/08/2015 0526   CREATININE 1.0 06/15/2015 1213   CREATININE 1.07* 06/08/2015 0526      Component Value Date/Time   CALCIUM 10.3 06/15/2015 1213   CALCIUM 8.7* 06/08/2015 0526   ALKPHOS 250* 06/15/2015 1213   ALKPHOS 203* 05/29/2015 1415   AST 48* 06/15/2015 1213   AST 51* 05/29/2015 1415   ALT 35 06/15/2015 1213   ALT 39 05/29/2015 1415   BILITOT 0.43 06/15/2015 1213   BILITOT 0.3 05/29/2015 1415       Lab Results  Component Value Date   LABCA2 58* 09/14/2012    No components found for: [id number]  No results for input(s): INR in the last 168 hours.  Urinalysis    Component Value Date/Time   COLORURINE YELLOW 05/29/2015 1420   APPEARANCEUR CLEAR 05/29/2015 1420   LABSPEC 1.019 05/29/2015 1420   LABSPEC 1.020 09/08/2014 0956   PHURINE 5.5 05/29/2015 1420   PHURINE 6.0 09/08/2014 0956   GLUCOSEU NEGATIVE 05/29/2015 1420   GLUCOSEU Negative 09/08/2014 0956   HGBUR MODERATE* 05/29/2015 1420   HGBUR Large 09/08/2014 0956   BILIRUBINUR NEGATIVE 05/29/2015 1420   BILIRUBINUR Negative 09/08/2014 [place] 05/29/2015 [place] Negative 09/08/2014 0956   PROTEINUR 30* 05/29/2015 1420   PROTEINUR 30 09/08/2014 0956  UROBILINOGEN 1.0 05/29/2015 1420   UROBILINOGEN 0.2 09/08/2014 0956   NITRITE NEGATIVE 05/29/2015 1420   NITRITE Negative 09/08/2014 0956   LEUKOCYTESUR NEGATIVE 05/29/2015 1420   LEUKOCYTESUR Negative 09/08/2014 0956    STUDIES: Ct Chest W Contrast  06/13/2015  CLINICAL DATA:  Restaging metastatic breast cancer. Oral chemotherapy ongoing. Recist protocol. EXAM: CT CHEST, ABDOMEN, AND PELVIS WITH CONTRAST  TECHNIQUE: Multidetector CT imaging of the chest, abdomen and pelvis was performed following the standard protocol during bolus administration of intravenous contrast. CONTRAST:  184m OMNIPAQUE IOHEXOL 300 MG/ML  SOLN COMPARISON:  CTs 04/19/2015 and 02/21/2015. FINDINGS: RECIST 1.1 Target Lesions: 1. Subpleural right lower lobe nodule measures 5 mm on image 38 of series 4, previously 6 mm. 2. Partially calcified subcarinal lymph node measures 6 mm on image 28, unchanged. 3. Previously demonstrated right hilar lymph node, no longer measurable. Non-target Lesions: 1. Minimal pleural thickening on the right. No significant pleural effusion. CT CHEST FINDINGS Mediastinum/Nodes: There are no enlarged mediastinal, hilar, internal mammary or axillary lymph nodes. Calcified subcarinal node is unchanged, measuring 6 mm on image 28. Tiny low-density lesions within the thyroid gland are stable. The heart size is normal. There is no pericardial effusion. Mild atherosclerosis appears unchanged. Lungs/Pleura: There is no pleural effusion.Mild pleural thickening is present on the right. There is stable linear scarring in the right lower lobe, most obvious on the reformatted images. The small nodular component along the lateral aspect of this scarring is stable, measuring 5 mm on image 30. The small right upper lobe nodule seen on the most recent study is no longer visualized. No new or enlarging pulmonary nodules. Musculoskeletal/Chest wall: Stable postsurgical changes status post right mastectomy. No chest wall lesion or suspicious osseous findings. CT ABDOMEN AND PELVIS FINDINGS Hepatobiliary: Diffuse hepatic steatosis is again noted. Hypervascular lesion in the right lobe on image 51 of series 2 is unchanged, consistent with a transient perfusion phenomenon. No suspicious hepatic findings. The gallbladder demonstrates stable mild wall thickening in the its fundus. No evidence of gallstones or surrounding inflammation. There is  mild extrahepatic biliary dilatation with the common bile duct measuring up to 8 mm in diameter. Pancreas: Unremarkable. No pancreatic ductal dilatation or surrounding inflammatory changes. Spleen: Normal in size without focal abnormality. Adrenals/Urinary Tract: There is a stable 12 mm left adrenal nodule consistent with an adenoma. The right adrenal gland appears normal. The kidneys appear normal without evidence of urinary tract calculus, suspicious lesion or hydronephrosis. No bladder abnormalities are seen. Stomach/Bowel: No evidence of bowel wall thickening, distention or surrounding inflammatory change. Moderate stool throughout the colon. The appendix appears normal. Vascular/Lymphatic: There are no enlarged abdominal or pelvic lymph nodes. Stable mild aortoiliac atherosclerosis. Reproductive: Unremarkable. Other: No ascites or peritoneal nodularity. Musculoskeletal: Interval left total hip arthroplasty with beam hardening artifact contributing to limited evaluation of the inferior pelvis. There are stable sclerotic lesions in the left sacrum and left iliac bone. No lytic lesion or pathologic fracture identified. Stable postsurgical changes at L3-4. IMPRESSION: 1. Stable examinations without findings suspicious for progressive disease. 2. Small right lower lobe nodule is unchanged, likely nodular scarring. 3. Stable sclerotic lesions within the left sacrum and left iliac bone, likely treated metastases. Electronically Signed   By: WRichardean SaleM.D.   On: 06/13/2015 15:18   Ct Abdomen Pelvis W Contrast  06/13/2015  CLINICAL DATA:  Restaging metastatic breast cancer. Oral chemotherapy ongoing. Recist protocol. EXAM: CT CHEST, ABDOMEN, AND PELVIS WITH CONTRAST TECHNIQUE: Multidetector CT imaging of  the chest, abdomen and pelvis was performed following the standard protocol during bolus administration of intravenous contrast. CONTRAST:  177mL OMNIPAQUE IOHEXOL 300 MG/ML  SOLN COMPARISON:  CTs 04/19/2015  and 02/21/2015. FINDINGS: RECIST 1.1 Target Lesions: 1. Subpleural right lower lobe nodule measures 5 mm on image 38 of series 4, previously 6 mm. 2. Partially calcified subcarinal lymph node measures 6 mm on image 28, unchanged. 3. Previously demonstrated right hilar lymph node, no longer measurable. Non-target Lesions: 1. Minimal pleural thickening on the right. No significant pleural effusion. CT CHEST FINDINGS Mediastinum/Nodes: There are no enlarged mediastinal, hilar, internal mammary or axillary lymph nodes. Calcified subcarinal node is unchanged, measuring 6 mm on image 28. Tiny low-density lesions within the thyroid gland are stable. The heart size is normal. There is no pericardial effusion. Mild atherosclerosis appears unchanged. Lungs/Pleura: There is no pleural effusion.Mild pleural thickening is present on the right. There is stable linear scarring in the right lower lobe, most obvious on the reformatted images. The small nodular component along the lateral aspect of this scarring is stable, measuring 5 mm on image 30. The small right upper lobe nodule seen on the most recent study is no longer visualized. No new or enlarging pulmonary nodules. Musculoskeletal/Chest wall: Stable postsurgical changes status post right mastectomy. No chest wall lesion or suspicious osseous findings. CT ABDOMEN AND PELVIS FINDINGS Hepatobiliary: Diffuse hepatic steatosis is again noted. Hypervascular lesion in the right lobe on image 51 of series 2 is unchanged, consistent with a transient perfusion phenomenon. No suspicious hepatic findings. The gallbladder demonstrates stable mild wall thickening in the its fundus. No evidence of gallstones or surrounding inflammation. There is mild extrahepatic biliary dilatation with the common bile duct measuring up to 8 mm in diameter. Pancreas: Unremarkable. No pancreatic ductal dilatation or surrounding inflammatory changes. Spleen: Normal in size without focal abnormality.  Adrenals/Urinary Tract: There is a stable 12 mm left adrenal nodule consistent with an adenoma. The right adrenal gland appears normal. The kidneys appear normal without evidence of urinary tract calculus, suspicious lesion or hydronephrosis. No bladder abnormalities are seen. Stomach/Bowel: No evidence of bowel wall thickening, distention or surrounding inflammatory change. Moderate stool throughout the colon. The appendix appears normal. Vascular/Lymphatic: There are no enlarged abdominal or pelvic lymph nodes. Stable mild aortoiliac atherosclerosis. Reproductive: Unremarkable. Other: No ascites or peritoneal nodularity. Musculoskeletal: Interval left total hip arthroplasty with beam hardening artifact contributing to limited evaluation of the inferior pelvis. There are stable sclerotic lesions in the left sacrum and left iliac bone. No lytic lesion or pathologic fracture identified. Stable postsurgical changes at L3-4. IMPRESSION: 1. Stable examinations without findings suspicious for progressive disease. 2. Small right lower lobe nodule is unchanged, likely nodular scarring. 3. Stable sclerotic lesions within the left sacrum and left iliac bone, likely treated metastases. Electronically Signed   By: Richardean Sale M.D.   On: 06/13/2015 15:18   Dg Pelvis Portable  06/07/2015  CLINICAL DATA:  Status post left hip replacement EXAM: PORTABLE PELVIS 1-2 VIEWS COMPARISON:  None. FINDINGS: A left hip prosthesis seen. No acute bony abnormality is noted. No dislocation is noted. A surgical drain is noted. No gross soft tissue abnormality is seen. IMPRESSION: Status post left hip replacement Electronically Signed   By: Inez Catalina M.D.   On: 06/07/2015 17:46   Dg C-arm 1-60 Min-no Report  06/07/2015  CLINICAL DATA: hip C-ARM 1-60 MINUTES Fluoroscopy was utilized by the requesting physician.  No radiographic interpretation.      ASSESSMENT:  64 y.o. Barbara Parks, Barbara Parks with stage IV breast cancer, on BOLERO-4  trial  (1) status post right mastectomy and sentinel lymph node sampling 03/22/2004 for a pT2 pN1, stage IIB invasive ductal carcinoma with lobular features, grade 2, estrogen receptor and progesterone receptor positive, HER-2 negative, with an MIB-1 of 9% ([id number] and [id number])  (2) addition all right axillary lymph node sampling 05/07/2004 showed 2 benign lymph nodes (4 lymph nodes previously removed, so total was one positive lymph node out of 6; [id number])  (3) the patient was evaluated by radiation oncology; no postmastectomy radiation recommended  (4) adjuvant chemotherapy with dose dense doxorubicin and cyclophosphamide x4 cycles (first cycle delayed one week) followed by dose dense paclitaxel x4 was completed 09/18/2004  (5) tamoxifen started March 2006, discontinued 2009  METASTATIC DISEASE: (6) presenting with a large pericardial effusion, large right pleural effusion and possible right middle lobe bronchial obstruction February 2013, status post pericardial window placement, fiberoptic bronchoscopy and right Pleurx placement 09/11/2011, with biopsy of the bronchus intermedius and pericardium positive for metastatic breast cancer, estrogen receptor 91% positive with moderate staining intensity, progesterone receptor 100% positive with strong staining intensity, with an MIB-1 of 35%, and no HER-2 amplification, the signals ratio being 1.37 ([id number])  (7) enrolled in BOLERO-4 trial 10/10/2011, receiving letrozole and everolimus  (a) two small areas of enhancement in the cerebellum noted by brain MRI 10/03/2011 were no longer apparent on repeat MRI 08/21/2012-- most recent brain MRI 04/01/2014 showed no evidence of intracranial metastatic disease  (b) sclerotic lesions in left iliac bone and sacrum have not been biopsied; stable; plan is to start zolendronate after patient updates her dental care (extraction planned)  (c) RLL lung nodule, stable (rescanned 07/12/2014 and 08/11/2014) R  hilar and subcarinal lymph nodes: stable  (9) additional problems:  (a) hepatic steatosis  (b) COPD/ emphysema/ asthma  (c) advanced L hip osteoarthritis, status post left total hip replacement 06/07/2015  (d) aortoiliac atherosclerosis  (e) dental evaluation pending w possible dental extractions  (f) possible thalassemia trait  (10) Bone density concerns: DEXA scan 08/11/2013 was normal   PLAN: Loryn is now nearly 4 years out from her diagnosis of metastatic disease and note only is she alive but she is essentially symptom free as far as her breast cancer is concerned. She is also tolerating her letrozole and ever Lyme as well.  The changes were seeing in her lab work are due to her recent surgery. I would expect them to normalize with a little bit more time.  She is having a rough time with physical therapy. This is not uncommon. I urged her to continue to do her best but she will be seeing Dr. Reynaldo Minium next week and he may wish to assign her to a different therapist since there seems to be some tension with the one she currently has been assigned to.  I asked Millenia if she would be willing to speak to the local paper regarding her very remarkable course and she is very much willing to do that. I have contacted our out reach folks to operationalized that  Otherwise she will see [place] again for evaluation at her 50th cycle of treatment. She knows to call for any problems that may develop before then.   Chauncey Cruel, MD   06/17/2015 12:31 PM

## 2015-06-15 NOTE — Progress Notes (Signed)
06/15/15 at 1:39pm - [employer]/ Bolero 4 cycle 49, day 1 study notes- The pt was into the cancer center this am for her fasting labs and cycle 49, day 1assessments. The pt is 8 days post-op from her left hip replacement surgery.  The pt is using a walker, but she is able to get up and down without assistance. The pt reports that she is feeling fine, but she is not happy with her physical therapist.  The pt said that her PT is rude and very rough.  Dr. Jana Hakim encouraged the pt to call her case manager and have her PT replaced with another therapist.  The pt confirmed that she was fasting upon arrival to the cancer center for her labs. The pt was seen and examined today by Dr. Jana Hakim. He reviewed the pt's labs, and he felt that her abnormal lab values were "not clinically significant". He felt that some of her CBC abnormalities were "reactive from her recent surgery".  The pt's vitals and weight were stable, and her mean BP was obtained for study purposes. Dr. Jana Hakim reviewed the pt's CT scans from 06/13/15. He confirmed that her target and non-target disease is stable. The pt's overall disease response is a "partial response". The radiologist stated "the small right upper lobe nodule seen on the most recent study is no longer visualized".   He said that overall he is very pleased with her status, and he wishes to continue her study treatment without any modifications. She reports the following AE's as ongoing: hot flashes, memory impairment, intermittent nausea/diarrhea, fatigue, back pain, and bilateral knee pain. She reports taking coumadin since 06/12/15, but the pt is unable to remember her current dose.  The pt said that her blood work has been stable.  The pt was instructed to call the research nurse with her current dosage of coumadin for reporting purposes.  She reports the following concomitant medications as ongoing: Lopressor, compazine prn, immodium prn, lopid, ibuprofen prn, vitamin D,  and prilosec. She is performing all of her usual activities with some limitations. ECOG=1. The pt returned her cycle 48 everolimus study drug boxes. She confirmed that she took her everolimus (10 mg) every day along with letrozole (2.5 mg) from 05/18/15 - 06/14/15 (28 days) except for the day of her surgery on 06/07/15. The pt's everolimus drug boxes had 2 remaining (unopened) blister packs. The pt was dispensed 56 pills - 54 taken =2 pills. Therefore, the pt is 96.4% compliant with her study drug, everolimus, for cycle 48. The pt's everolimus was taken to the pharmacy for the drug accountability check and storage. The pt was dispensed her cycle 49 everolimus study drug boxes for self administration. The pt is aware of her December appointments.The pt's lipid panel is pending. Brion Aliment RN, BSN, [place] Clinical Research Nurse 06/15/2015 2:00 PM   06/16/15 at 10:28 am- The research nurse called the pt to inform her of her lipid panel results.  The pt said that she will no longer have physical therapy coming to her house.  The pt is scheduled to see her orthopedic surgeon next week for follow up and to assess the need for further physical therapy services.  The pt confirmed that she is taking coumadin 2.5mg  BID for prophylaxis for blood clots following her hip surgery.  The pt said that she took [place] through 06/11/15 and then she began the coumadin on 06/12/15.  Dr. Jana Hakim asked the pt yesterday if she would be willing to share her  story to the newspaper.  The pt said that she would love to share her experiences about her clinical trial.  The research nurse informed the pt that someone from [employer] may contact her in the future.  The [employer] study team has been notified of the pt's interest to share her experiences about her study involvement.   Brion Aliment RN, BSN, CCRP Clinical Research Nurse 06/16/2015 10:38 AM

## 2015-06-18 ENCOUNTER — Other Ambulatory Visit: Payer: Self-pay | Admitting: Oncology

## 2015-06-20 DIAGNOSIS — Z471 Aftercare following joint replacement surgery: Secondary | ICD-10-CM | POA: Diagnosis not present

## 2015-06-20 DIAGNOSIS — Z96642 Presence of left artificial hip joint: Secondary | ICD-10-CM | POA: Diagnosis not present

## 2015-06-21 ENCOUNTER — Other Ambulatory Visit: Payer: Self-pay | Admitting: Oncology

## 2015-06-21 ENCOUNTER — Other Ambulatory Visit: Payer: Self-pay | Admitting: *Deleted

## 2015-06-23 DIAGNOSIS — M5136 Other intervertebral disc degeneration, lumbar region: Secondary | ICD-10-CM | POA: Diagnosis not present

## 2015-06-23 DIAGNOSIS — C7800 Secondary malignant neoplasm of unspecified lung: Secondary | ICD-10-CM | POA: Diagnosis not present

## 2015-06-23 DIAGNOSIS — G8929 Other chronic pain: Secondary | ICD-10-CM | POA: Diagnosis not present

## 2015-06-23 DIAGNOSIS — M1990 Unspecified osteoarthritis, unspecified site: Secondary | ICD-10-CM | POA: Diagnosis not present

## 2015-06-23 DIAGNOSIS — Z471 Aftercare following joint replacement surgery: Secondary | ICD-10-CM | POA: Diagnosis not present

## 2015-06-23 DIAGNOSIS — C50911 Malignant neoplasm of unspecified site of right female breast: Secondary | ICD-10-CM | POA: Diagnosis not present

## 2015-06-29 DIAGNOSIS — C50911 Malignant neoplasm of unspecified site of right female breast: Secondary | ICD-10-CM | POA: Diagnosis not present

## 2015-06-29 DIAGNOSIS — M1990 Unspecified osteoarthritis, unspecified site: Secondary | ICD-10-CM | POA: Diagnosis not present

## 2015-06-29 DIAGNOSIS — G8929 Other chronic pain: Secondary | ICD-10-CM | POA: Diagnosis not present

## 2015-06-29 DIAGNOSIS — Z471 Aftercare following joint replacement surgery: Secondary | ICD-10-CM | POA: Diagnosis not present

## 2015-06-29 DIAGNOSIS — C7800 Secondary malignant neoplasm of unspecified lung: Secondary | ICD-10-CM | POA: Diagnosis not present

## 2015-06-29 DIAGNOSIS — M5136 Other intervertebral disc degeneration, lumbar region: Secondary | ICD-10-CM | POA: Diagnosis not present

## 2015-07-07 ENCOUNTER — Other Ambulatory Visit: Payer: Self-pay | Admitting: Oncology

## 2015-07-11 ENCOUNTER — Other Ambulatory Visit: Payer: Self-pay | Admitting: *Deleted

## 2015-07-11 DIAGNOSIS — Z471 Aftercare following joint replacement surgery: Secondary | ICD-10-CM | POA: Diagnosis not present

## 2015-07-11 DIAGNOSIS — Z96642 Presence of left artificial hip joint: Secondary | ICD-10-CM | POA: Diagnosis not present

## 2015-07-11 DIAGNOSIS — C50911 Malignant neoplasm of unspecified site of right female breast: Secondary | ICD-10-CM

## 2015-07-11 DIAGNOSIS — C7800 Secondary malignant neoplasm of unspecified lung: Principal | ICD-10-CM

## 2015-07-12 ENCOUNTER — Other Ambulatory Visit: Payer: Self-pay | Admitting: *Deleted

## 2015-07-12 DIAGNOSIS — C50911 Malignant neoplasm of unspecified site of right female breast: Secondary | ICD-10-CM

## 2015-07-12 DIAGNOSIS — C801 Malignant (primary) neoplasm, unspecified: Secondary | ICD-10-CM

## 2015-07-12 DIAGNOSIS — C50411 Malignant neoplasm of upper-outer quadrant of right female breast: Secondary | ICD-10-CM

## 2015-07-12 DIAGNOSIS — C7800 Secondary malignant neoplasm of unspecified lung: Secondary | ICD-10-CM

## 2015-07-12 DIAGNOSIS — I3131 Malignant pericardial effusion in diseases classified elsewhere: Secondary | ICD-10-CM

## 2015-07-12 DIAGNOSIS — I313 Pericardial effusion (noninflammatory): Principal | ICD-10-CM

## 2015-07-13 ENCOUNTER — Other Ambulatory Visit: Payer: Self-pay | Admitting: *Deleted

## 2015-07-13 ENCOUNTER — Other Ambulatory Visit (HOSPITAL_COMMUNITY)
Admission: RE | Admit: 2015-07-13 | Discharge: 2015-07-13 | Disposition: A | Discharge status: Home / Self Care | Payer: Medicare Other | Source: Ambulatory Visit | Attending: Oncology | Admitting: Oncology

## 2015-07-13 ENCOUNTER — Encounter: Payer: Self-pay | Admitting: *Deleted

## 2015-07-13 ENCOUNTER — Encounter: Payer: Self-pay | Admitting: Nurse Practitioner

## 2015-07-13 ENCOUNTER — Other Ambulatory Visit (HOSPITAL_BASED_OUTPATIENT_CLINIC_OR_DEPARTMENT_OTHER): Payer: Medicare Other

## 2015-07-13 ENCOUNTER — Ambulatory Visit (HOSPITAL_BASED_OUTPATIENT_CLINIC_OR_DEPARTMENT_OTHER): Payer: Medicare Other | Admitting: Nurse Practitioner

## 2015-07-13 ENCOUNTER — Telehealth: Payer: Self-pay | Admitting: Oncology

## 2015-07-13 VITALS — BP 117/78 | HR 111 | Temp 98.1°F | Resp 19 | Ht 64.0 in | Wt 163.5 lb

## 2015-07-13 DIAGNOSIS — R300 Dysuria: Secondary | ICD-10-CM

## 2015-07-13 DIAGNOSIS — R319 Hematuria, unspecified: Secondary | ICD-10-CM

## 2015-07-13 DIAGNOSIS — Z006 Encounter for examination for normal comparison and control in clinical research program: Secondary | ICD-10-CM

## 2015-07-13 DIAGNOSIS — C50911 Malignant neoplasm of unspecified site of right female breast: Secondary | ICD-10-CM | POA: Insufficient documentation

## 2015-07-13 DIAGNOSIS — C7800 Secondary malignant neoplasm of unspecified lung: Principal | ICD-10-CM

## 2015-07-13 DIAGNOSIS — C7801 Secondary malignant neoplasm of right lung: Secondary | ICD-10-CM | POA: Diagnosis not present

## 2015-07-13 DIAGNOSIS — Z170 Estrogen receptor positive status [ER+]: Secondary | ICD-10-CM

## 2015-07-13 LAB — LIPID PANEL
Cholesterol: 242 mg/dL — ABNORMAL HIGH (ref 125–200)
HDL: 54 mg/dL (ref >=46)
LDL CALC: 136 mg/dL — AB (ref <130)
TRIGLYCERIDES: 260 mg/dL — AB (ref <150)
Total CHOL/HDL Ratio: 4.5 Ratio (ref <=5.0)
VLDL: 52 mg/dL — ABNORMAL HIGH (ref <30)

## 2015-07-13 LAB — COMPREHENSIVE METABOLIC PANEL
ALT: 26 U/L (ref 0–55)
ANION GAP: 12 meq/L — AB (ref 3–11)
AST: 34 U/L (ref 5–34)
Albumin: 3.4 g/dL — ABNORMAL LOW (ref 3.5–5.0)
Alkaline Phosphatase: 223 U/L — ABNORMAL HIGH (ref 40–150)
BILIRUBIN TOTAL: 0.36 mg/dL (ref 0.20–1.20)
BUN: 18.4 mg/dL (ref 7.0–26.0)
CO2: 18 meq/L — AB (ref 22–29)
CREATININE: 0.9 mg/dL (ref 0.6–1.1)
Calcium: 10 mg/dL (ref 8.4–10.4)
Chloride: 106 mEq/L (ref 98–109)
EGFR: 66 mL/min/{1.73_m2} — ABNORMAL LOW (ref >90)
GLUCOSE: 146 mg/dL — AB (ref 70–140)
Potassium: 4.1 mEq/L (ref 3.5–5.1)
Sodium: 136 mEq/L (ref 136–145)
TOTAL PROTEIN: 8.1 g/dL (ref 6.4–8.3)

## 2015-07-13 LAB — CBC WITH DIFFERENTIAL/PLATELET
BASO%: 0.9 % (ref 0.0–2.0)
Basophils Absolute: 0.1 10*3/uL (ref 0.0–0.1)
EOS%: 3.7 % (ref 0.0–7.0)
Eosinophils Absolute: 0.3 10*3/uL (ref 0.0–0.5)
HCT: 39.2 % (ref 34.8–46.6)
HGB: 12.3 g/dL (ref 11.6–15.9)
LYMPH%: 10.9 % — AB (ref 14.0–49.7)
MCH: 25.2 pg (ref 25.1–34.0)
MCHC: 31.4 g/dL — AB (ref 31.5–36.0)
MCV: 80.2 fL (ref 79.5–101.0)
MONO#: 1 10*3/uL — AB (ref 0.1–0.9)
MONO%: 11 % (ref 0.0–14.0)
NEUT#: 6.5 10*3/uL (ref 1.5–6.5)
NEUT%: 73.5 % (ref 38.4–76.8)
PLATELETS: 356 10*3/uL (ref 145–400)
RBC: 4.89 10*6/uL (ref 3.70–5.45)
RDW: 15.1 % — ABNORMAL HIGH (ref 11.2–14.5)
WBC: 8.8 10*3/uL (ref 3.9–10.3)
lymph#: 1 10*3/uL (ref 0.9–3.3)

## 2015-07-13 LAB — URINALYSIS, MICROSCOPIC - CHCC
Bilirubin (Urine): NEGATIVE
Glucose: NEGATIVE mg/dL
KETONES: NEGATIVE mg/dL
Leukocyte Esterase: NEGATIVE
NITRITE: NEGATIVE
PH: 6 (ref 4.6–8.0)
Protein: 300 mg/dL
SPECIFIC GRAVITY, URINE: 1.03 (ref 1.003–1.035)
UROBILINOGEN UR: 0.2 mg/dL (ref 0.2–1)

## 2015-07-13 LAB — PHOSPHORUS: Phosphorus: 3.1 mg/dL (ref 2.5–4.6)

## 2015-07-13 LAB — URIC ACID: Uric Acid, Serum: 3.2 mg/dl (ref 2.6–7.4)

## 2015-07-13 LAB — BILIRUBIN, DIRECT

## 2015-07-13 LAB — CK: Total CK: 42 U/L (ref 38–234)

## 2015-07-13 MED ORDER — INV-EVEROLIMUS (RAD0001) 5MG TABLET NOVARTIS CRAD001Y24135: 2.0000 | ORAL_TABLET | Freq: Every day | ORAL | Status: DC

## 2015-07-13 NOTE — Telephone Encounter (Signed)
Called and left a message with 09/07/15 appointment

## 2015-07-13 NOTE — Progress Notes (Signed)
07/13/15 at 9:39am- [employer]/ Bolero 4 cycle 50, day 1 study notes- The pt was into the cancer center this am for her fasting labs and cycle 50, day 1assessments. The pt is 5 weeks post-op from her left hip replacement surgery. The pt is now using only a cane, and she is able to get up and down without assistance.  The pt said her orthopedist thinks she will only "need a cane for one more week'.The pt reports that she is feeling fine, but the cold weather is making her hurt some. The pt confirmed that she was fasting upon arrival to the cancer center for her labs. The pt was seen and examined today by Dr. Virgie Dad NP, Gentry Fitz. She reviewed the pt's labs, and she felt that her abnormal lab values were "not clinically significant". She informed the pt that her CBC abnormalities have resolved.  The pt's vitals were stable, and her mean BP was obtained for study purposes The pt has some additional weight loss following surgery (weight loss-grade 2).  The pt said that she "lost her appetite for awhile".  She said that her appetite is "back to normal now".  She reports the following AE's as ongoing: hot flashes, memory impairment, fatigue, back pain, and bilateral knee pain. The pt states that her post-surgery nausea has resolved.  She reports some ongoing post-surgery pain.  The pt said that she did discontinue her oxycodone on 06/21/15.  She said that she is using Robaxin and Ultram for pain relief.  She denies any diarrhea.  The pt had a routine urinalysis obtained.  The pt has ongoing proteinuria and hematuria.  The pt was evaluated for these urinary issues in the past, and the urologist released the pt after he found no abnormal findings at his office visit.  The pt denies any urinary symptoms.  She reports stopping her Coumadin 2.5 mg on 06/28/15.  She reports the following concomitant medications as ongoing: Lopressor, compazine prn, immodium prn, lopid, ibuprofen prn, and prilosec. She is  performing all of her usual activities with some limitations. ECOG=1.  The pt returned her cycle 49 everolimus study drug boxes. She confirmed that she took her everolimus (10 mg) every day along with letrozole (2.5 mg) from 06/15/15 - 07/12/15 (28 days). The pt's everolimus drug boxes had 0 remaining (unopened) blister packs. The pt was dispensed 56 pills - 56 taken =0 pills. Therefore, the pt is 100% compliant with her study drug, everolimus, for cycle 49. The pt's everolimus was taken to the pharmacy for the drug accountability check and storage. The pt was dispensed her cycle 50 everolimus study drug boxes for self administration. The pt is aware of her January appointments.The pt's lipid panel is pending. Brion Aliment RN, BSN, CCRP Clinical Research Nurse 07/13/2015 9:46 AM   07/14/15 at 10:47am- The pt's lipid panel is stable.   Brion Aliment RN, BSN Clinical Research Nurse 07/14/2015 10:48 AM

## 2015-07-13 NOTE — Progress Notes (Signed)
[place]  [phone] Fax:([phone]     ID: UNKNOWN FLANNIGAN DOB: Mar 09, 1951  MR#: [id number]  SAY#:[id number]  Patient Care Team: Darcus Austin, MD as PCP - General (Family Medicine) Jerline Pain, MD (Cardiology) Gaye Pollack, MD (Cardiothoracic Surgery) Chauncey Cruel, MD as Consulting Physician (Oncology) OTHER MD: Dr Erroll Luna- Merlene Laughter, DDS; Gaynelle Arabian MD  CHIEF COMPLAINT: metastatic breast cancer, on BOLERO study  CURRENT TREATMENT: letrozole + everolimus  BREAST CANCER HISTORY: From Dr. Collier [name]'s original intake note 04/13/2004:  "This woman has been in good health all of her life. She recently moved from [place] to work here.  She palpated a mass at about the 12 o'clock position in mid-July. She has not noticed any nipple retraction or skin changes.  She was seen by her primary care doctor who subsequently referred her for a mammogram.  Mammogram was performed on 03/01/04. This demonstrated a spiculated 2 cm mass at the 12 o'clock position in the right breast.  Upper outer quadrant of the left breast shows some distortion.   Physical exam at that time showed a firm, nontender nodule at the 12 o'clock position in the right breast, 5 cm from the nipple.  Ultrasound of this area showed a hypoechoic ill-defined mass, measuring 2.2 x 1.3 x 1.4 cm.  Physical exam of the left breast showed general vague thickening, upper outer quadrant of the left breast with a discrete palpable mass. The ultrasound performed showed a single hypoechoic ill-defined nodule at the 1 o'clock position, measuring 7 x 5 x 6 mm.  She had biopsies of both lesions on 03/02/03.  Needle core biopsy of the lesion on the right breast revealed invasive mammary carcinoma.  Needle core biopsy of the left breast showed a complex fibroadenoma.  Prognostic panel of the lesion on the right breast showed it to be ER positive at 73%, PR positive at 90% and proliferative index 9%, HER-2  was 1+.  Patient was referred to Dr. Annamaria Boots, who performed a simple mastectomy with sentinel lymph node evaluation on 03/22/04.  Final pathology showed this to be an invasive ductal carcinoma  with lobular features, measuring 2.2 cm, grade 2 of 3.  Margins negative for carcinoma.  Invasive ductal carcinoma was extended to involve deep dermis of the nipple.  Lymphovascular invasion was identified.  Total of 4 sentinel lymph nodes were evaluated.  Touch imprints at the time of the OR was felt to be negative.  Subsequent evaluation showed a 5 mm focus of metastatic carcinoma in one of the four lymph nodes on microscopic after sectioning.  There was extracapsular extension  of one lymph node as well. The remaining three lymph nodes were all negative."  The patient's subsequent history is detailed below.  INTERVAL HISTORY: Nasreen returns today for follow up of her metastatic estrogen receptor positive breast cancer, accompanied by her research nurse, [name]. Alyiah is currently on cycle 49 of the BOLERO-4 trial with letrozole and everolimus. She has tolerated this well with few side effects. She has some hot flashes, but denies vaginal changes, mucositis, or pneumonitis.   REVIEW OF SYSTEMS: Faria is now off of coumadin and has finished physical therapy for her left total hip replacement. She is ambulating better than ever, and hopes to be able to walk without her cane in a few weeks. Her appetite is slowly climbing back since using an Ensure supplement. She had lost 13lb since surgery and felt weak due to the lack  of appetite. She is using tramadol and advil alone for pain, and no longer needs the oxycodone. She believes positioning on the surgical table has irritated some discs in her back that were previously bulging. She has some numbness off and on to her left arm because of this. A detailed review of system is otherwise stable.  PAST MEDICAL HISTORY: Past Medical History  Diagnosis Date  . Shortness  of breath   . Arthritis     left hip  . Osteopenia due to cancer therapy 09/09/2013  . Hypercholesteremia   . breast ca 2005    breast/chemo R mastectomy  . Metastasis to lung ([place]) dx'd 08/2011  . GERD (gastroesophageal reflux disease)     PAST SURGICAL HISTORY: Past Surgical History  Procedure Laterality Date  . Spine surgery  1996  . Breast surgery  2005    right  . Video bronchoscopy  09/11/2011    Procedure: VIDEO BRONCHOSCOPY;  Surgeon: Gaye Pollack, MD;  Location: [facility];  Service: Thoracic;  Laterality: N/A;  . Chest tube insertion  09/11/2011    Procedure: INSERTION PLEURAL DRAINAGE CATHETER;  Surgeon: Gaye Pollack, MD;  Location: [facility];  Service: Thoracic;  Laterality: Right;  . Pericardial window  09/11/2011    Procedure: PERICARDIAL WINDOW;  Surgeon: Gaye Pollack, MD;  Location: [facility];  Service: Thoracic;  Laterality: N/A;  . Removal of pleural drainage catheter  12/19/2011    Procedure: REMOVAL OF PLEURAL DRAINAGE CATHETER;  Surgeon: Gaye Pollack, MD;  Location: [facility];  Service: Thoracic;  Laterality: Right;  TO BE DONE IN MINOR ROOM, SHORT STAY  . Cataract extraction w/phaco Left 01/18/2014    Procedure: CATARACT EXTRACTION PHACO AND INTRAOCULAR LENS PLACEMENT (IOC);  Surgeon: Elta Guadeloupe T. Gershon Crane, MD;  Location: [facility];  Service: Ophthalmology;  Laterality: Left;  CDE 15.79  . Cataract extraction w/phaco Right 02/08/2014    Procedure: CATARACT EXTRACTION PHACO AND INTRAOCULAR LENS PLACEMENT (IOC);  Surgeon: Elta Guadeloupe T. Gershon Crane, MD;  Location: [facility];  Service: Ophthalmology;  Laterality: Right;  CDE 4.41  . Total hip arthroplasty Left 06/07/2015    Procedure: LEFT TOTAL HIP ARTHROPLASTY ANTERIOR APPROACH;  Surgeon: Gaynelle Arabian, MD;  Location: [facility];  Service: Orthopedics;  Laterality: Left;    FAMILY HISTORY Family History  Problem Relation Age of Onset  . Cancer Mother     breast  . Heart disease Father   . Anesthesia problems Neg Hx    the patient's mother was  diagnosed with breast cancer at age 48, she died age 54 from congestive heart failure..The patient's father died from heart disease at age 26.  She has one sister alive & well.  Two brothers alive & well, one with diabetes. The patient's sister was also diagnosed with breast cancer, and was tested for the BRCA gene, and was negative. The patient herself has not been tested. There is no history of ovarian cancer in the family  GYNECOLOGIC HISTORY:  No LMP recorded. Patient is postmenopausal. Menarche age 61, the patient is GX P0. She stopped having periods with chemotherapy in 2005. 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 She was a Archivist. She is single, lives alone with her Shitzu-poodle Sammie.. Family is all in the [place] area.     ADVANCED DIRECTIVES: Not in place. At the 02/23/2014 visit the patient was given the  appropriate documents to complete and notarize at her discretion. She tells me she is planning to name her sister, Billie Ruddy, as healthcare power of attorney. [name] can be reached at [phone]   HEALTH MAINTENANCE: Social History  Substance Use Topics  . Smoking status: Former Smoker -- 1.50 packs/day for 30 years    Types: Cigarettes    Quit date: 09/10/2007  . Smokeless tobacco: Not on file  . Alcohol Use: No     Colonoscopy:  PAP:  Bone density: 08/11/2013, showed osteopenia  Lipid panel:  Allergies  Allergen Reactions  . Aspirin     REACTION: upset stomach  . Latex Other (See Comments)    Blistering and skin peels off  . Nsaids Nausea And Vomiting    Extreme nausea and vomiting    Current Outpatient Prescriptions  Medication Sig Dispense Refill  . gemfibrozil (LOPID) 600 MG tablet TAKE ONE TABLET BY MOUTH TWICE DAILY BEFORE  A  MEAL 60 tablet 0  . Investigational everolimus (RAD001) 5 MG tablet Novartis [id number] Take 2  tablets by mouth daily. Take with a glass of water. 70 tablet 0  . Investigational everolimus (RAD001) 5 MG tablet Novartis [id number] Take 2 tablets by mouth daily. Take with a glass of water. 70 tablet 0  . letrozole (FEMARA) 2.5 MG tablet TAKE ONE TABLET BY MOUTH ONCE DAILY 90 tablet 0  . methocarbamol (ROBAXIN) 500 MG tablet Take 1 tablet (500 mg total) by mouth every 6 (six) hours as needed for muscle spasms. 90 tablet 0  . metoprolol tartrate (LOPRESSOR) 25 MG tablet Take 0.5 tablets (12.5 mg total) by mouth daily. 30 tablet 4  . omeprazole (PRILOSEC) 40 MG capsule Take 1 capsule (40 mg total) by mouth daily as needed. (Patient taking differently: Take 40 mg by mouth daily. ) 90 capsule 3  . traMADol (ULTRAM) 50 MG tablet Take 1-2 tablets (50-100 mg total) by mouth every 6 (six) hours as needed (mild pain). (Patient taking differently: Take 50-100 mg by mouth every 6 (six) hours as needed (mild pain). Pt taking 2 tablets at bedtime only) 80 tablet 1  . loperamide (IMODIUM) 2 MG capsule Take 2 mg by mouth 4 (four) times daily as needed for diarrhea or loose stools. Reported on 07/13/2015    . prochlorperazine (COMPAZINE) 10 MG tablet Take 10 mg by mouth every 6 (six) hours as needed for nausea or vomiting. Reported on 07/13/2015     No current facility-administered medications for this visit.    OBJECTIVE: Middle-aged white woman using a walker Filed Vitals:   07/13/15 0905 07/13/15 0906  BP: 109/81 117/78  Pulse: 120 111  Temp:    Resp:       Body mass index is 28.05 kg/(m^2).    ECOG FS:1 - Symptomatic but completely ambulatory  Skin: warm, dry  HEENT: sclerae anicteric, conjunctivae pink, oropharynx clear. No thrush or mucositis.  Lymph Nodes: No cervical or supraclavicular lymphadenopathy  Lungs: clear to auscultation bilaterally, no rales, wheezes, or rhonci  Heart: regular rate and rhythm  Abdomen: round, soft, non tender, positive bowel sounds  Musculoskeletal: No focal  spinal tenderness, slight limp to left hip status post recent left total hip replacement Neuro: non focal, well oriented, positive affect  Breasts: deferred  LAB RESULTS:  CMP     Component Value Date/Time   NA 136 07/13/2015 0842   NA 131* 06/08/2015 0526   K 4.1 07/13/2015 0842   K 4.0 06/08/2015 0526   CL 100*  06/08/2015 0526   CL 109* 12/31/2012 0940   CO2 18* 07/13/2015 0842   CO2 21* 06/08/2015 0526   GLUCOSE 146* 07/13/2015 0842   GLUCOSE 339* 06/08/2015 0526   GLUCOSE 127* 12/31/2012 0940   BUN 18.4 07/13/2015 0842   BUN 17 06/08/2015 0526   CREATININE 0.9 07/13/2015 0842   CREATININE 1.07* 06/08/2015 0526   CALCIUM 10.0 07/13/2015 0842   CALCIUM 8.7* 06/08/2015 0526   PROT 8.1 07/13/2015 0842   PROT 7.4 05/29/2015 1415   ALBUMIN 3.4* 07/13/2015 0842   ALBUMIN 3.6 05/29/2015 1415   AST 34 07/13/2015 0842   AST 51* 05/29/2015 1415   ALT 26 07/13/2015 0842   ALT 39 05/29/2015 1415   ALKPHOS 223* 07/13/2015 0842   ALKPHOS 203* 05/29/2015 1415   BILITOT 0.36 07/13/2015 0842   BILITOT 0.3 05/29/2015 1415   GFRNONAA 54* 06/08/2015 0526   GFRAA >60 06/08/2015 0526    I No results found for: SPEP  Lab Results  Component Value Date   WBC 8.8 07/13/2015   NEUTROABS 6.5 07/13/2015   HGB 12.3 07/13/2015   HCT 39.2 07/13/2015   MCV 80.2 07/13/2015   PLT 356 07/13/2015      Chemistry      Component Value Date/Time   NA 136 07/13/2015 0842   NA 131* 06/08/2015 0526   K 4.1 07/13/2015 0842   K 4.0 06/08/2015 0526   CL 100* 06/08/2015 0526   CL 109* 12/31/2012 0940   CO2 18* 07/13/2015 0842   CO2 21* 06/08/2015 0526   BUN 18.4 07/13/2015 0842   BUN 17 06/08/2015 0526   CREATININE 0.9 07/13/2015 0842   CREATININE 1.07* 06/08/2015 0526      Component Value Date/Time   CALCIUM 10.0 07/13/2015 0842   CALCIUM 8.7* 06/08/2015 0526   ALKPHOS 223* 07/13/2015 0842   ALKPHOS 203* 05/29/2015 1415   AST 34 07/13/2015 0842   AST 51* 05/29/2015 1415   ALT 26  07/13/2015 0842   ALT 39 05/29/2015 1415   BILITOT 0.36 07/13/2015 0842   BILITOT 0.3 05/29/2015 1415       Lab Results  Component Value Date   LABCA2 58* 09/14/2012    No components found for: [id number]  No results for input(s): INR in the last 168 hours.  Urinalysis    Component Value Date/Time   COLORURINE YELLOW 05/29/2015 1420   APPEARANCEUR CLEAR 05/29/2015 1420   LABSPEC 1.030 07/13/2015 0841   LABSPEC 1.019 05/29/2015 1420   PHURINE 6.0 07/13/2015 0841   PHURINE 5.5 05/29/2015 1420   GLUCOSEU Negative 07/13/2015 0841   GLUCOSEU NEGATIVE 05/29/2015 1420   HGBUR Large 07/13/2015 0841   HGBUR MODERATE* 05/29/2015 1420   BILIRUBINUR Negative 07/13/2015 0841   BILIRUBINUR NEGATIVE 05/29/2015 1420   KETONESUR Negative 07/13/2015 [place] 05/29/2015 1420   PROTEINUR 300 07/13/2015 0841   PROTEINUR 30* 05/29/2015 1420   UROBILINOGEN 0.2 07/13/2015 0841   UROBILINOGEN 1.0 05/29/2015 1420   NITRITE Negative 07/13/2015 0841   NITRITE NEGATIVE 05/29/2015 1420   LEUKOCYTESUR Negative 07/13/2015 0841   LEUKOCYTESUR NEGATIVE 05/29/2015 1420    STUDIES: Ct Chest W Contrast  06/21/2015  ADDENDUM REPORT: 06/21/2015 15:56 ADDENDUM: Clinical research values below were measured using RECIST protocol 1.0 criteria. Electronically Signed   By: Suzy Bouchard M.D.   On: 06/21/2015 15:56  06/21/2015  CLINICAL DATA:  Restaging metastatic breast cancer. Oral chemotherapy ongoing. Recist protocol. EXAM: CT CHEST, ABDOMEN, AND PELVIS WITH CONTRAST TECHNIQUE:  Multidetector CT imaging of the chest, abdomen and pelvis was performed following the standard protocol during bolus administration of intravenous contrast. CONTRAST:  149m OMNIPAQUE IOHEXOL 300 MG/ML  SOLN COMPARISON:  CTs 04/19/2015 and 02/21/2015. FINDINGS: RECIST 1.1 Target Lesions: 1. Subpleural right lower lobe nodule measures 5 mm on image 38 of series 4, previously 6 mm. 2. Partially calcified subcarinal  lymph node measures 6 mm on image 28, unchanged. 3. Previously demonstrated right hilar lymph node, no longer measurable. Non-target Lesions: 1. Minimal pleural thickening on the right. No significant pleural effusion. CT CHEST FINDINGS Mediastinum/Nodes: There are no enlarged mediastinal, hilar, internal mammary or axillary lymph nodes. Calcified subcarinal node is unchanged, measuring 6 mm on image 28. Tiny low-density lesions within the thyroid gland are stable. The heart size is normal. There is no pericardial effusion. Mild atherosclerosis appears unchanged. Lungs/Pleura: There is no pleural effusion.Mild pleural thickening is present on the right. There is stable linear scarring in the right lower lobe, most obvious on the reformatted images. The small nodular component along the lateral aspect of this scarring is stable, measuring 5 mm on image 30. The small right upper lobe nodule seen on the most recent study is no longer visualized. No new or enlarging pulmonary nodules. Musculoskeletal/Chest wall: Stable postsurgical changes status post right mastectomy. No chest wall lesion or suspicious osseous findings. CT ABDOMEN AND PELVIS FINDINGS Hepatobiliary: Diffuse hepatic steatosis is again noted. Hypervascular lesion in the right lobe on image 51 of series 2 is unchanged, consistent with a transient perfusion phenomenon. No suspicious hepatic findings. The gallbladder demonstrates stable mild wall thickening in the its fundus. No evidence of gallstones or surrounding inflammation. There is mild extrahepatic biliary dilatation with the common bile duct measuring up to 8 mm in diameter. Pancreas: Unremarkable. No pancreatic ductal dilatation or surrounding inflammatory changes. Spleen: Normal in size without focal abnormality. Adrenals/Urinary Tract: There is a stable 12 mm left adrenal nodule consistent with an adenoma. The right adrenal gland appears normal. The kidneys appear normal without evidence of  urinary tract calculus, suspicious lesion or hydronephrosis. No bladder abnormalities are seen. Stomach/Bowel: No evidence of bowel wall thickening, distention or surrounding inflammatory change. Moderate stool throughout the colon. The appendix appears normal. Vascular/Lymphatic: There are no enlarged abdominal or pelvic lymph nodes. Stable mild aortoiliac atherosclerosis. Reproductive: Unremarkable. Other: No ascites or peritoneal nodularity. Musculoskeletal: Interval left total hip arthroplasty with beam hardening artifact contributing to limited evaluation of the inferior pelvis. There are stable sclerotic lesions in the left sacrum and left iliac bone. No lytic lesion or pathologic fracture identified. Stable postsurgical changes at L3-4. IMPRESSION: 1. Stable examinations without findings suspicious for progressive disease. 2. Small right lower lobe nodule is unchanged, likely nodular scarring. 3. Stable sclerotic lesions within the left sacrum and left iliac bone, likely treated metastases. Electronically Signed: By: WRichardean SaleM.D. On: 06/13/2015 15:18   Ct Abdomen Pelvis W Contrast  06/21/2015  ADDENDUM REPORT: 06/21/2015 15:56 ADDENDUM: Clinical research values below were measured using RECIST protocol 1.0 criteria. Electronically Signed   By: SSuzy BouchardM.D.   On: 06/21/2015 15:56  06/21/2015  CLINICAL DATA:  Restaging metastatic breast cancer. Oral chemotherapy ongoing. Recist protocol. EXAM: CT CHEST, ABDOMEN, AND PELVIS WITH CONTRAST TECHNIQUE: Multidetector CT imaging of the chest, abdomen and pelvis was performed following the standard protocol during bolus administration of intravenous contrast. CONTRAST:  1062mOMNIPAQUE IOHEXOL 300 MG/ML  SOLN COMPARISON:  CTs 04/19/2015 and 02/21/2015. FINDINGS: RECIST 1.1 Target Lesions:  1. Subpleural right lower lobe nodule measures 5 mm on image 38 of series 4, previously 6 mm. 2. Partially calcified subcarinal lymph node measures 6 mm on  image 28, unchanged. 3. Previously demonstrated right hilar lymph node, no longer measurable. Non-target Lesions: 1. Minimal pleural thickening on the right. No significant pleural effusion. CT CHEST FINDINGS Mediastinum/Nodes: There are no enlarged mediastinal, hilar, internal mammary or axillary lymph nodes. Calcified subcarinal node is unchanged, measuring 6 mm on image 28. Tiny low-density lesions within the thyroid gland are stable. The heart size is normal. There is no pericardial effusion. Mild atherosclerosis appears unchanged. Lungs/Pleura: There is no pleural effusion.Mild pleural thickening is present on the right. There is stable linear scarring in the right lower lobe, most obvious on the reformatted images. The small nodular component along the lateral aspect of this scarring is stable, measuring 5 mm on image 30. The small right upper lobe nodule seen on the most recent study is no longer visualized. No new or enlarging pulmonary nodules. Musculoskeletal/Chest wall: Stable postsurgical changes status post right mastectomy. No chest wall lesion or suspicious osseous findings. CT ABDOMEN AND PELVIS FINDINGS Hepatobiliary: Diffuse hepatic steatosis is again noted. Hypervascular lesion in the right lobe on image 51 of series 2 is unchanged, consistent with a transient perfusion phenomenon. No suspicious hepatic findings. The gallbladder demonstrates stable mild wall thickening in the its fundus. No evidence of gallstones or surrounding inflammation. There is mild extrahepatic biliary dilatation with the common bile duct measuring up to 8 mm in diameter. Pancreas: Unremarkable. No pancreatic ductal dilatation or surrounding inflammatory changes. Spleen: Normal in size without focal abnormality. Adrenals/Urinary Tract: There is a stable 12 mm left adrenal nodule consistent with an adenoma. The right adrenal gland appears normal. The kidneys appear normal without evidence of urinary tract calculus,  suspicious lesion or hydronephrosis. No bladder abnormalities are seen. Stomach/Bowel: No evidence of bowel wall thickening, distention or surrounding inflammatory change. Moderate stool throughout the colon. The appendix appears normal. Vascular/Lymphatic: There are no enlarged abdominal or pelvic lymph nodes. Stable mild aortoiliac atherosclerosis. Reproductive: Unremarkable. Other: No ascites or peritoneal nodularity. Musculoskeletal: Interval left total hip arthroplasty with beam hardening artifact contributing to limited evaluation of the inferior pelvis. There are stable sclerotic lesions in the left sacrum and left iliac bone. No lytic lesion or pathologic fracture identified. Stable postsurgical changes at L3-4. IMPRESSION: 1. Stable examinations without findings suspicious for progressive disease. 2. Small right lower lobe nodule is unchanged, likely nodular scarring. 3. Stable sclerotic lesions within the left sacrum and left iliac bone, likely treated metastases. Electronically Signed: By: Richardean Sale M.D. On: 06/13/2015 15:18      ASSESSMENT: 64 y.o. [place], [place] woman with stage IV breast cancer, on BOLERO-4 trial  (1) status post right mastectomy and sentinel lymph node sampling 03/22/2004 for a pT2 pN1, stage IIB invasive ductal carcinoma with lobular features, grade 2, estrogen receptor and progesterone receptor positive, HER-2 negative, with an MIB-1 of 9% ([id number] and [id number])  (2) addition all right axillary lymph node sampling 05/07/2004 showed 2 benign lymph nodes (4 lymph nodes previously removed, so total was one positive lymph node out of 6; [id number])  (3) the patient was evaluated by radiation oncology; no postmastectomy radiation recommended  (4) adjuvant chemotherapy with dose dense doxorubicin and cyclophosphamide x4 cycles (first cycle delayed one week) followed by dose dense paclitaxel x4 was completed 09/18/2004  (5) tamoxifen started March 2006, discontinued  2009  METASTATIC DISEASE: (6) presenting with  a large pericardial effusion, large right pleural effusion and possible right middle lobe bronchial obstruction February 2013, status post pericardial window placement, fiberoptic bronchoscopy and right Pleurx placement 09/11/2011, with biopsy of the bronchus intermedius and pericardium positive for metastatic breast cancer, estrogen receptor 91% positive with moderate staining intensity, progesterone receptor 100% positive with strong staining intensity, with an MIB-1 of 35%, and no HER-2 amplification, the signals ratio being 1.37 ([id number])  (7) enrolled in BOLERO-4 trial 10/10/2011, receiving letrozole and everolimus  (a) two small areas of enhancement in the cerebellum noted by brain MRI 10/03/2011 were no longer apparent on repeat MRI 08/21/2012-- most recent brain MRI 04/01/2014 showed no evidence of intracranial metastatic disease  (b) sclerotic lesions in left iliac bone and sacrum have not been biopsied; stable; plan is to start zolendronate after patient updates her dental care (extraction planned)  (c) RLL lung nodule, stable (rescanned 07/12/2014 and 08/11/2014) R hilar and subcarinal lymph nodes: stable  (9) additional problems:  (a) hepatic steatosis  (b) COPD/ emphysema/ asthma  (c) advanced L hip osteoarthritis, status post left total hip replacement 06/07/2015  (d) aortoiliac atherosclerosis  (e) dental evaluation pending w possible dental extractions  (f) possible thalassemia trait  (10) Bone density concerns: DEXA scan 08/11/2013 was normal   PLAN: Amarachukwu looks and feels remarkably well today. She is moving well with her new hip. The labs were reviewed in detail and are starting to normalize since her surgery. She will continue on the protein supplements to gain the weight back that she has lost. She will continue on the BOLERO-4 trial with letrozole and everolimus as planned.   Breland will return in 4 weeks for labs and a  follow up visit. Prior to that she will have repeat CT scans. She understands and agrees with this plan. She knows the goal of treatment in her case is control. She has been encouraged to call with any issues that might arise before her next visit here.    Laurie Panda, NP   07/13/2015 9:28 AM

## 2015-07-19 ENCOUNTER — Other Ambulatory Visit: Payer: Self-pay | Admitting: Oncology

## 2015-07-21 ENCOUNTER — Telehealth: Payer: Self-pay | Admitting: *Deleted

## 2015-07-21 NOTE — Telephone Encounter (Signed)
Call received from patient asking why no response has been sent to [facility] in [place] in reference to Lopid refill request.  Advised the order was sent eRx yesterday.  She will call pharmacy.

## 2015-08-08 ENCOUNTER — Ambulatory Visit (HOSPITAL_COMMUNITY)
Admission: RE | Admit: 2015-08-08 | Discharge: 2015-08-08 | Disposition: A | Discharge status: Home / Self Care | Payer: Medicare Other | Source: Ambulatory Visit | Attending: Oncology | Admitting: Oncology

## 2015-08-08 ENCOUNTER — Encounter (HOSPITAL_COMMUNITY): Payer: Self-pay

## 2015-08-08 DIAGNOSIS — Z9011 Acquired absence of right breast and nipple: Secondary | ICD-10-CM | POA: Diagnosis not present

## 2015-08-08 DIAGNOSIS — M899 Disorder of bone, unspecified: Secondary | ICD-10-CM | POA: Diagnosis not present

## 2015-08-08 DIAGNOSIS — K760 Fatty (change of) liver, not elsewhere classified: Secondary | ICD-10-CM | POA: Diagnosis not present

## 2015-08-08 DIAGNOSIS — E278 Other specified disorders of adrenal gland: Secondary | ICD-10-CM | POA: Insufficient documentation

## 2015-08-08 DIAGNOSIS — C7800 Secondary malignant neoplasm of unspecified lung: Secondary | ICD-10-CM | POA: Diagnosis not present

## 2015-08-08 DIAGNOSIS — C50911 Malignant neoplasm of unspecified site of right female breast: Secondary | ICD-10-CM

## 2015-08-08 DIAGNOSIS — R911 Solitary pulmonary nodule: Secondary | ICD-10-CM | POA: Diagnosis not present

## 2015-08-08 MED ORDER — IOHEXOL 300 MG/ML  SOLN
100.0000 mL | Freq: Once | INTRAMUSCULAR | Status: AC | PRN
  Administered 2015-08-08: 100 mL via INTRAVENOUS

## 2015-08-08 MED ORDER — IOHEXOL 300 MG/ML  SOLN
50.0000 mL | Freq: Once | INTRAMUSCULAR | Status: DC | PRN
  Administered 2015-08-08: 50 mL via ORAL
  Filled 2015-08-08: qty 50

## 2015-08-09 ENCOUNTER — Other Ambulatory Visit: Payer: Self-pay | Admitting: *Deleted

## 2015-08-09 DIAGNOSIS — C50911 Malignant neoplasm of unspecified site of right female breast: Secondary | ICD-10-CM

## 2015-08-09 DIAGNOSIS — C7800 Secondary malignant neoplasm of unspecified lung: Principal | ICD-10-CM

## 2015-08-09 IMAGING — CT CT CHEST W/ CM
2 of 5 series · 16 of 46 positions shown, 18 images · IV contrast (omnipaque)
Comparison: Prior examinations 11/30/2013 and 10/05/2013.

ADDENDUM:
Clinical research values were measured using RECIST protocol 1.0.
CLINICAL DATA: Right breast cancer with metastatic disease to the
lung, brain and bone. Restaging post oral chemotherapy.

EXAM:
CT CHEST, ABDOMEN, AND PELVIS WITH CONTRAST
TECHNIQUE: Multidetector CT imaging of the chest, abdomen and pelvis was
performed following the standard protocol during bolus
administration of intravenous contrast.
CONTRAST:  50mL OMNIPAQUE IOHEXOL 300 MG/ML SOLN, 100mL OMNIPAQUE
IOHEXOL 300 MG/ML SOLN

[Series 2: cap with st · axial · 0.73mm/px · z∈[-501,+44]mm · 13 of 125 slices shown, 15 images]
[im 8/125  soft-tissue]
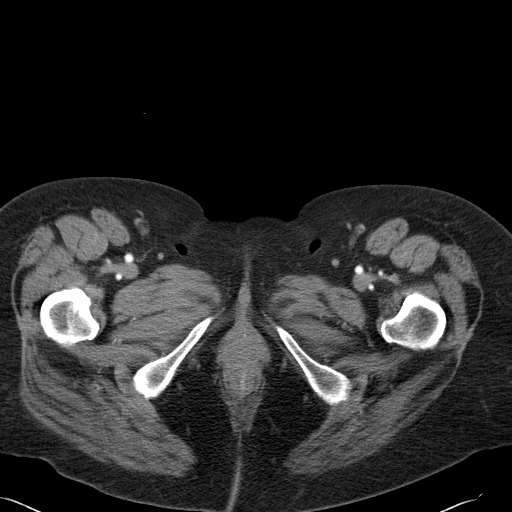
[im 8/125  bone]
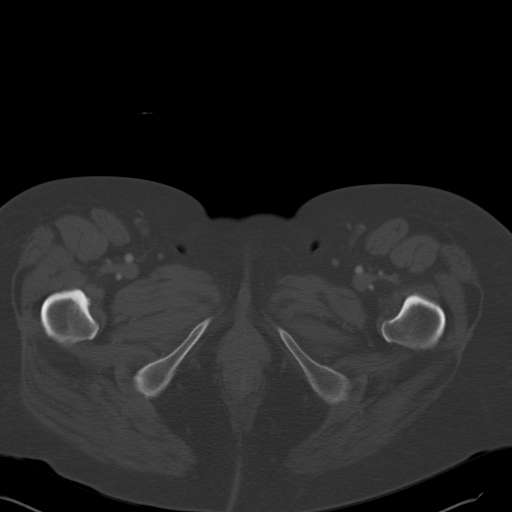
[im 15/125  soft-tissue]
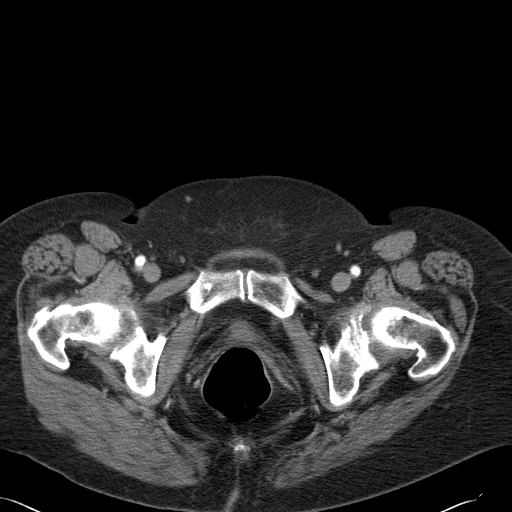
[im 30/125  soft-tissue]
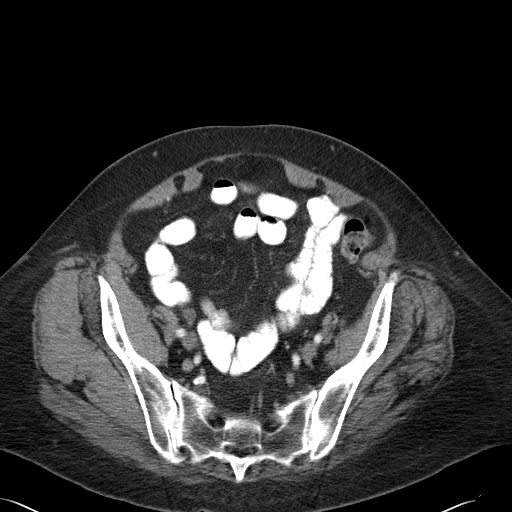
[im 37/125  soft-tissue]
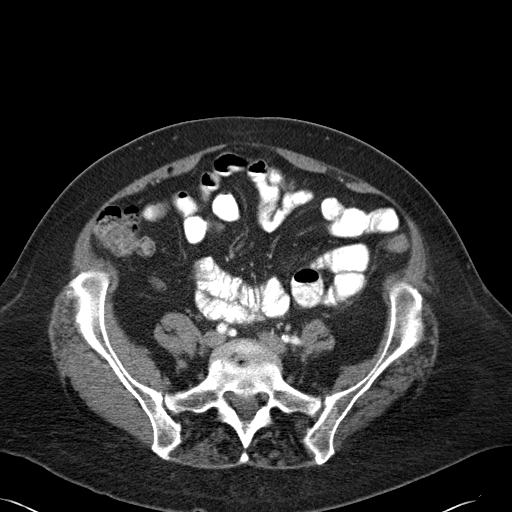
[im 44/125  soft-tissue]
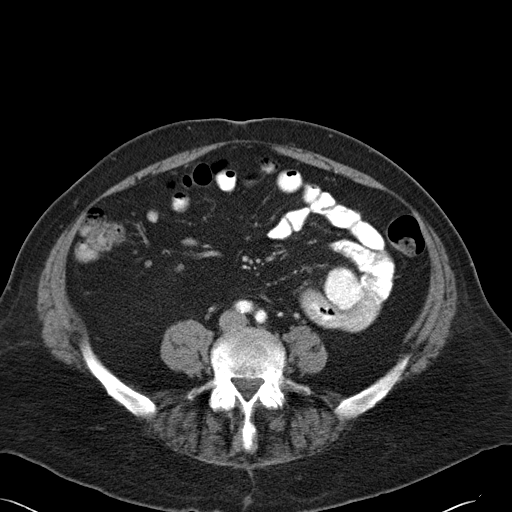
[im 52/125  soft-tissue]
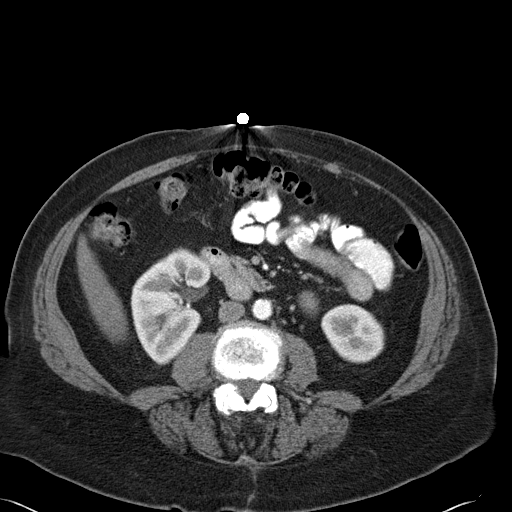
[im 66/125  soft-tissue]
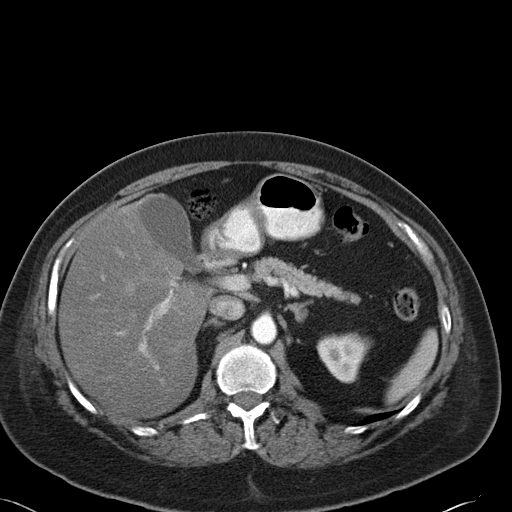
[im 73/125  soft-tissue]
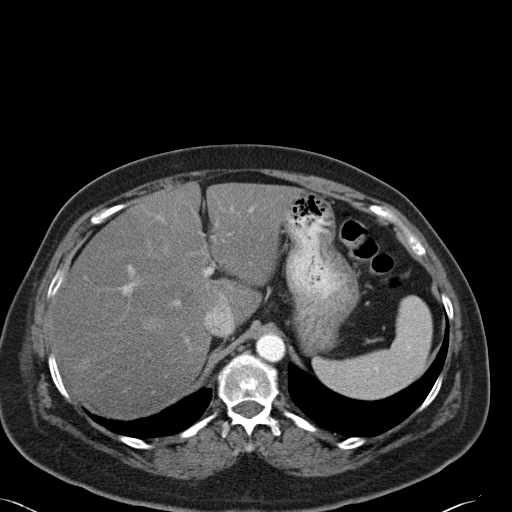
[im 81/125  soft-tissue]
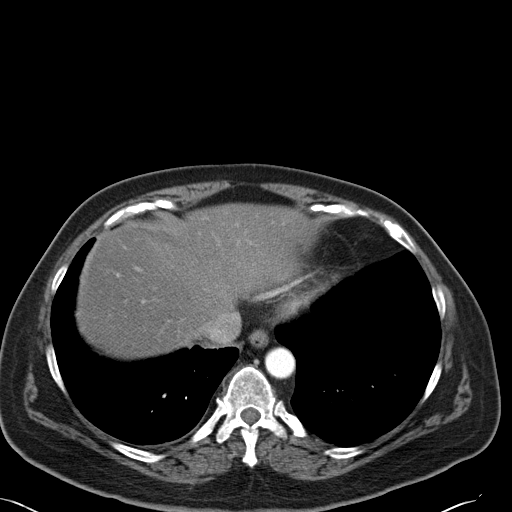
[im 81/125  bone]
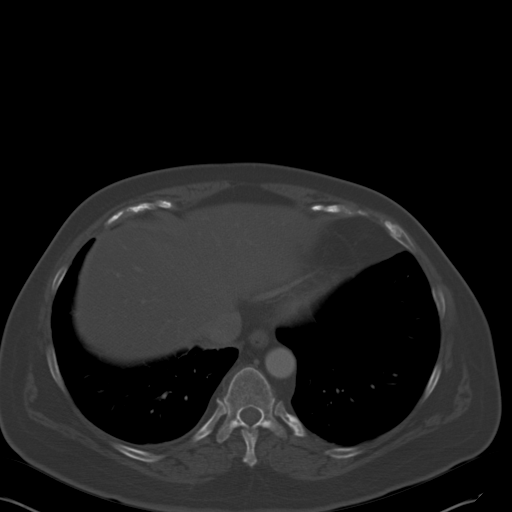
[im 88/125  soft-tissue]
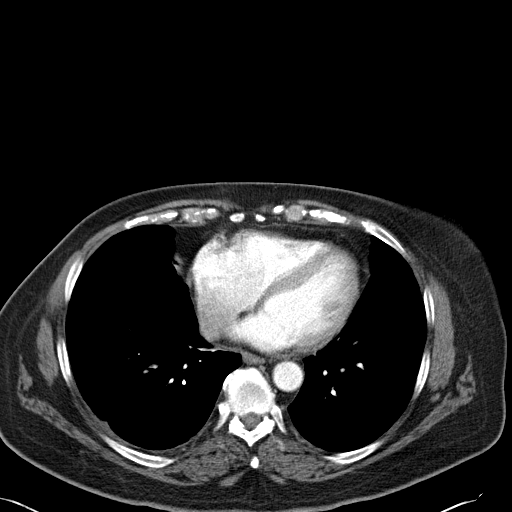
[im 95/125  soft-tissue]
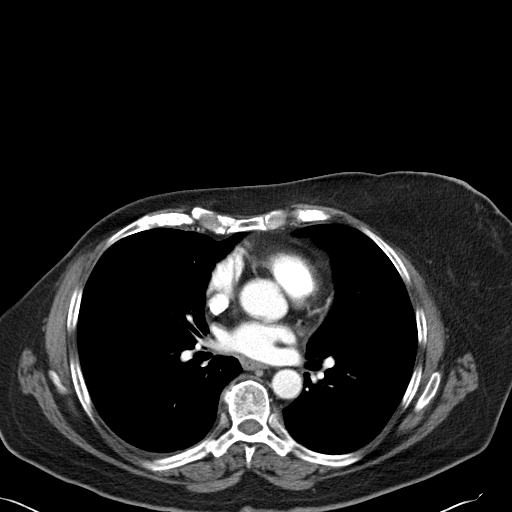
[im 110/125  soft-tissue]
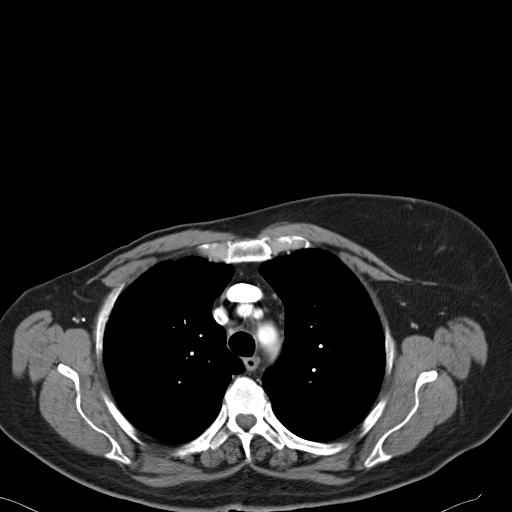
[im 117/125  soft-tissue]
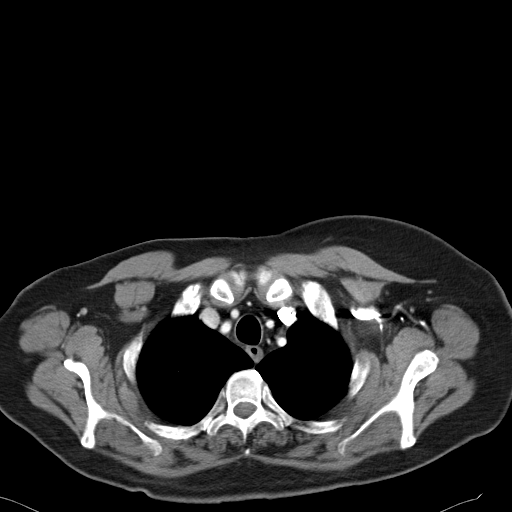

[Series 602: <mpr thick range> · coronal · 1.22mm/px · 3 of 95 slices shown]
[im 32/95  soft-tissue]
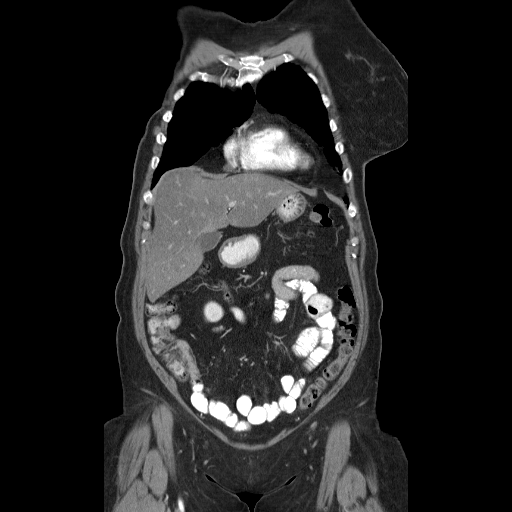
[im 42/95  soft-tissue]
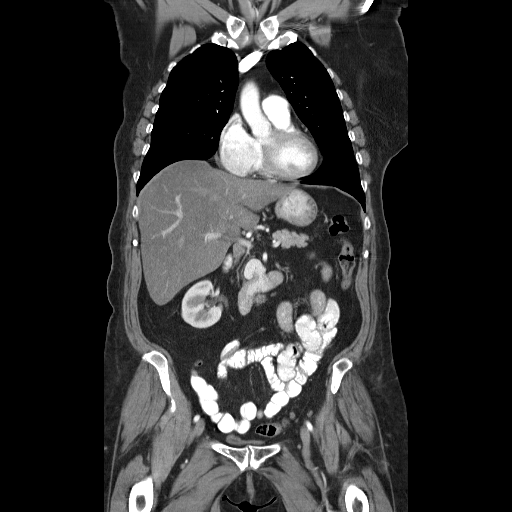
[im 53/95  soft-tissue]
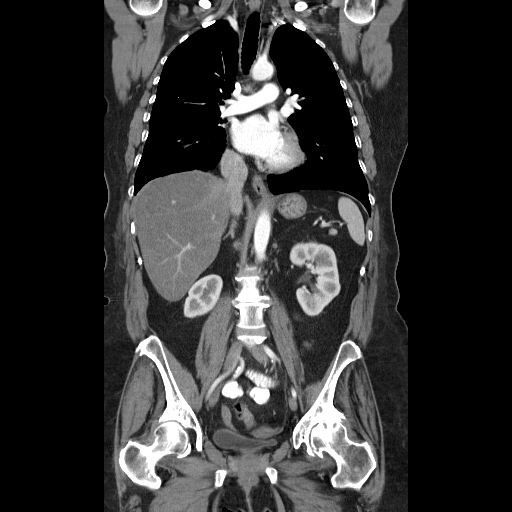

[16 of 46 positions shown; findings below may reference images not displayed]

FINDINGS: RECIST

Target Lesions:

1. Right lower lobe pulmonary nodule measures 6 mm on image 38,
unchanged.
2. Partially calcified subcarinal lymph node measures 7 mm on image
28, unchanged.

3. Right hilar lymph node measures 4 mm on image 26, previously 5
mm.
Non-target Lesions:

1. Non measurable.
2.

CT CHEST FINDINGS

There are stable postsurgical changes status post right mastectomy
and axillary node dissection. There are no enlarged axillary,
internal mammary, mediastinal or hilar lymph nodes.

The heart size is stable. There is stable mild atherosclerosis. No
pleural or pericardial effusion is present.

The lungs appear stable with linear right lower lobe scarring and a
6 mm subpleural nodule on image 38 (noted above). No new or
enlarging nodules are demonstrated.

CT ABDOMEN AND PELVIS FINDINGS

The liver has a stable appearance with steatosis and probable focal
sparing around the gallbladder and posteriorly in the right hepatic
lobe (image 53). No new or enlarging liver lesions are identified.
Nodularity gallbladder wall appears stable.

The spleen, pancreas, adrenal glands and kidneys appear normal.

The stomach, small bowel, appendix and colon demonstrate no
significant findings. There is stable aortoiliac atherosclerosis.
There is no adenopathy or ascites. The bladder, uterus and ovaries
appear unchanged.

There are stable sclerotic lesions within the left iliac bone and
sacrum, probably partially treated metastases. Advanced left hip
osteoarthritis again noted.
IMPRESSION: 1. No residual measurable extra osseous metastatic disease within
the chest, abdomen or pelvis.
2. Stable sclerotic lesions within the left iliac bone and sacrum.
3. No evidence of local recurrence.
4. Stable heterogeneous hepatic steatosis.

## 2015-08-10 ENCOUNTER — Other Ambulatory Visit (HOSPITAL_COMMUNITY)
Admission: AD | Admit: 2015-08-10 | Discharge: 2015-08-10 | Disposition: A | Discharge status: Home / Self Care | Payer: Medicare Other | Source: Ambulatory Visit | Attending: Oncology | Admitting: Oncology

## 2015-08-10 ENCOUNTER — Other Ambulatory Visit: Payer: Self-pay | Admitting: *Deleted

## 2015-08-10 ENCOUNTER — Encounter: Payer: Self-pay | Admitting: Oncology

## 2015-08-10 ENCOUNTER — Encounter: Payer: Self-pay | Admitting: *Deleted

## 2015-08-10 ENCOUNTER — Encounter: Payer: Self-pay | Admitting: Nurse Practitioner

## 2015-08-10 ENCOUNTER — Other Ambulatory Visit (HOSPITAL_BASED_OUTPATIENT_CLINIC_OR_DEPARTMENT_OTHER): Payer: Medicare Other

## 2015-08-10 ENCOUNTER — Ambulatory Visit (HOSPITAL_BASED_OUTPATIENT_CLINIC_OR_DEPARTMENT_OTHER): Payer: Medicare Other | Admitting: Nurse Practitioner

## 2015-08-10 VITALS — BP 128/66 | HR 93 | Temp 97.8°F | Resp 18 | Ht 64.0 in | Wt 167.2 lb

## 2015-08-10 DIAGNOSIS — Z79811 Long term (current) use of aromatase inhibitors: Secondary | ICD-10-CM | POA: Diagnosis not present

## 2015-08-10 DIAGNOSIS — C7800 Secondary malignant neoplasm of unspecified lung: Principal | ICD-10-CM

## 2015-08-10 DIAGNOSIS — C50911 Malignant neoplasm of unspecified site of right female breast: Secondary | ICD-10-CM

## 2015-08-10 DIAGNOSIS — C50411 Malignant neoplasm of upper-outer quadrant of right female breast: Secondary | ICD-10-CM

## 2015-08-10 DIAGNOSIS — Z006 Encounter for examination for normal comparison and control in clinical research program: Secondary | ICD-10-CM

## 2015-08-10 DIAGNOSIS — M779 Enthesopathy, unspecified: Secondary | ICD-10-CM

## 2015-08-10 LAB — CBC WITH DIFFERENTIAL/PLATELET
BASO%: 1 % (ref 0.0–2.0)
BASOS ABS: 0.1 10*3/uL (ref 0.0–0.1)
EOS ABS: 0.2 10*3/uL (ref 0.0–0.5)
EOS%: 2.5 % (ref 0.0–7.0)
HCT: 38.5 % (ref 34.8–46.6)
HEMOGLOBIN: 12.6 g/dL (ref 11.6–15.9)
LYMPH%: 10.1 % — AB (ref 14.0–49.7)
MCH: 24.7 pg — AB (ref 25.1–34.0)
MCHC: 32.9 g/dL (ref 31.5–36.0)
MCV: 75.3 fL — AB (ref 79.5–101.0)
MONO#: 0.9 10*3/uL (ref 0.1–0.9)
MONO%: 10.5 % (ref 0.0–14.0)
NEUT#: 6.8 10*3/uL — ABNORMAL HIGH (ref 1.5–6.5)
NEUT%: 75.9 % (ref 38.4–76.8)
PLATELETS: 332 10*3/uL (ref 145–400)
RBC: 5.11 10*6/uL (ref 3.70–5.45)
RDW: 15.6 % — ABNORMAL HIGH (ref 11.2–14.5)
WBC: 9 10*3/uL (ref 3.9–10.3)
lymph#: 0.9 10*3/uL (ref 0.9–3.3)

## 2015-08-10 LAB — COMPREHENSIVE METABOLIC PANEL
ALK PHOS: 187 U/L — AB (ref 40–150)
ALT: 25 U/L (ref 0–55)
AST: 34 U/L (ref 5–34)
Albumin: 3.3 g/dL — ABNORMAL LOW (ref 3.5–5.0)
Anion Gap: 12 mEq/L — ABNORMAL HIGH (ref 3–11)
BUN: 11.2 mg/dL (ref 7.0–26.0)
CHLORIDE: 109 meq/L (ref 98–109)
CO2: 19 meq/L — AB (ref 22–29)
Calcium: 9.9 mg/dL (ref 8.4–10.4)
Creatinine: 0.9 mg/dL (ref 0.6–1.1)
EGFR: 68 mL/min/{1.73_m2} — AB (ref >90)
GLUCOSE: 135 mg/dL (ref 70–140)
POTASSIUM: 3.9 meq/L (ref 3.5–5.1)
SODIUM: 140 meq/L (ref 136–145)
Total Bilirubin: 0.32 mg/dL (ref 0.20–1.20)
Total Protein: 7.6 g/dL (ref 6.4–8.3)

## 2015-08-10 LAB — URIC ACID: Uric Acid, Serum: 3.1 mg/dl (ref 2.6–7.4)

## 2015-08-10 LAB — PHOSPHORUS: Phosphorus: 2.8 mg/dL (ref 2.5–4.6)

## 2015-08-10 LAB — BILIRUBIN, DIRECT: Bilirubin, Direct: <0.1 mg/dL — ABNORMAL LOW (ref 0.1–0.5)

## 2015-08-10 MED ORDER — TRAMADOL HCL 50 MG PO TABS: 50.0000 mg | ORAL_TABLET | Freq: Four times a day (QID) | ORAL | Status: DC | PRN

## 2015-08-10 MED ORDER — INV-EVEROLIMUS (RAD0001) 5MG TABLET NOVARTIS CRAD001Y24135: 2.0000 | ORAL_TABLET | Freq: Every day | ORAL | Status: DC

## 2015-08-10 NOTE — Progress Notes (Signed)
08/10/15 at 9:40am- [employer]/ Bolero 4 cycle 51, day 1 study notes- The pt was into the cancer center this am for her fasting labs and cycle 51, day 1assessments.  The pt confirmed that she was fasting upon arrival to the cancer center for her labs. The pt was seen and examined today by Dr. Virgie Dad NP, Gentry Fitz.She reviewed the pt's labs, and she felt that her abnormal lab values were "not clinically significant". The pt's vitals and weight were stable, and her mean BP was obtained for study purposes. Dr. Jana Hakim and the NP reviewed the pt's CT scans from 08/08/15. He confirmed that her target and non-target disease is stable. The pt's overall disease response is a "partial response".  He said that overall he is very pleased with her status, and he wishes to continue her study treatment without any modifications.She states she has been doing well.  She just returned from visiting her family in [place].  The pt is walking without a cane now.  She reports that she has developed achillis tendinitis.  The NP felt that the achilles tendonitis is related to the pt's gait since she is no longer using her cane.  She reports the following AE's as ongoing: hot flashes, memory impairment, fatigue, back pain, and bilateral knee pain.  She denies any left arm numbness today.  She reports the following concomitant medications as ongoing: Lopressor, compazine prn, immodium prn, lopid, ibuprofen prn, and vitamin D. She reports that she is no longer taking robaxin and Prilosec.  She reported restarting Vitamin D 1000 mg BID on 08/09/15.  She is performing all of her usual activities with some limitations. ECOG=1. The pt returned her cycle 50 everolimus study drug boxes. She confirmed that she took her everolimus (10 mg) every day along with letrozole (2.5 mg) from 07/13/15 - 08/09/15 (28 days). The pt's everolimus drug boxes had 0 remaining (unopened) blister packs. The pt was dispensed 56 pills - 56 taken =0  pills. Therefore, the pt is 100% compliant with her study drug, everolimus, for cycle 50. The pt's everolimus was taken to the pharmacy for the drug accountability check and storage. The pt was dispensed her cycle 51 everolimus study drug boxes for self administration. The pt was also given a free sample of letrozole today.  The pt is aware of her February and March appointments.The pt's lipid panel is pending. Brion Aliment RN, BSN, CCRP Clinical Research Nurse 08/10/2015 9:47 AM   08/11/15 - at 10:01am- The pt's lipid panel is stable.   Brion Aliment RN, BSN, CCRP Clinical Research Nurse 08/11/2015 10:05 AM

## 2015-08-10 NOTE — Progress Notes (Signed)
[place]  [phone] [phone]     ID: ROSALYND MCWRIGHT DOB: 1960/03/30  MR#: [id number]  UTM#:[id number]  Patient Care Team: Darcus Austin, MD as PCP - General (Family Medicine) Jerline Pain, MD (Cardiology) Gaye Pollack, MD (Cardiothoracic Surgery) Chauncey Cruel, MD as Consulting Physician (Oncology) OTHER MD: Dr Erroll Luna- Merlene Laughter, DDS; Gaynelle Arabian MD  CHIEF COMPLAINT: metastatic breast cancer, on BOLERO study  CURRENT TREATMENT: letrozole + everolimus  BREAST CANCER HISTORY: From Dr. Collier [name]'s original intake note 04/13/2004:  "This woman has been in good health all of her life. She recently moved from [place] to work here.  She palpated a mass at about the 12 o'clock position in mid-July. She has not noticed any nipple retraction or skin changes.  She was seen by her primary care doctor who subsequently referred her for a mammogram.  Mammogram was performed on 03/01/04. This demonstrated a spiculated 2 cm mass at the 12 o'clock position in the right breast.  Upper outer quadrant of the left breast shows some distortion.   Physical exam at that time showed a firm, nontender nodule at the 12 o'clock position in the right breast, 5 cm from the nipple.  Ultrasound of this area showed a hypoechoic ill-defined mass, measuring 2.2 x 1.3 x 1.4 cm.  Physical exam of the left breast showed general vague thickening, upper outer quadrant of the left breast with a discrete palpable mass. The ultrasound performed showed a single hypoechoic ill-defined nodule at the 1 o'clock position, measuring 7 x 5 x 6 mm.  She had biopsies of both lesions on 03/02/03.  Needle core biopsy of the lesion on the right breast revealed invasive mammary carcinoma.  Needle core biopsy of the left breast showed a complex fibroadenoma.  Prognostic panel of the lesion on the right breast showed it to be ER positive at 73%, PR positive at 90% and proliferative index 9%, HER-2  was 1+.  Patient was referred to Dr. Annamaria Boots, who performed a simple mastectomy with sentinel lymph node evaluation on 03/22/04.  Final pathology showed this to be an invasive ductal carcinoma  with lobular features, measuring 2.2 cm, grade 2 of 3.  Margins negative for carcinoma.  Invasive ductal carcinoma was extended to involve deep dermis of the nipple.  Lymphovascular invasion was identified.  Total of 4 sentinel lymph nodes were evaluated.  Touch imprints at the time of the OR was felt to be negative.  Subsequent evaluation showed a 5 mm focus of metastatic carcinoma in one of the four lymph nodes on microscopic after sectioning.  There was extracapsular extension  of one lymph node as well. The remaining three lymph nodes were all negative."  The patient's subsequent history is detailed below.  INTERVAL HISTORY: Yentl returns today for follow up of her metastatic estrogen receptor positive breast cancer, accompanied by her research nurse, [name]. Adelaida is currently on cycle 65 of the BOLERO-4 trial with letrozole and everolimus. She has always tolerated this well with few complaints. She has some hot flashes, but denies vaginal changes, mucositis, or pneumonitis.   REVIEW OF SYSTEMS: Kolleen stopped using her cane last month and was ambulating well as far as her hip is concerned. She is now back to limping however because of left foot tendonitis. She is using 2 tramadol tablets at night to quiet the pain. Her hip and the rest of her joints are fine. She recently returned from [place] after attending the funeral  of a family member. She ate well and is gaining back some of the weight she lost after surgery. She denies fevers, chills, nausea, vomiting, or changes in bowel or bladder habits. She has no shortness of breath, chest pain, cough, or palpitations. A detailed review of systems is otherwise stable.  PAST MEDICAL HISTORY: Past Medical History  Diagnosis Date  . Shortness of breath   .  Arthritis     left hip  . Osteopenia due to cancer therapy 09/09/2013  . Hypercholesteremia   . breast ca 2005    breast/chemo R mastectomy  . Metastasis to lung ([place]) dx'd 08/2011  . GERD (gastroesophageal reflux disease)     PAST SURGICAL HISTORY: Past Surgical History  Procedure Laterality Date  . Spine surgery  1996  . Breast surgery  2005    right  . Video bronchoscopy  09/11/2011    Procedure: VIDEO BRONCHOSCOPY;  Surgeon: Gaye Pollack, MD;  Location: [facility] [facility];  Service: Thoracic;  Laterality: N/A;  . Chest tube insertion  09/11/2011    Procedure: INSERTION PLEURAL DRAINAGE CATHETER;  Surgeon: Gaye Pollack, MD;  Location: [facility];  Service: Thoracic;  Laterality: Right;  . Pericardial window  09/11/2011    Procedure: PERICARDIAL WINDOW;  Surgeon: Gaye Pollack, MD;  Location: [facility];  Service: Thoracic;  Laterality: N/A;  . Removal of pleural drainage catheter  12/19/2011    Procedure: REMOVAL OF PLEURAL DRAINAGE CATHETER;  Surgeon: Gaye Pollack, MD;  Location: [place];  Service: Thoracic;  Laterality: Right;  TO BE DONE IN MINOR ROOM, SHORT STAY  . Cataract extraction w/phaco Left 01/18/2014    Procedure: CATARACT EXTRACTION PHACO AND INTRAOCULAR LENS PLACEMENT (IOC);  Surgeon: Elta Guadeloupe T. Gershon Crane, MD;  Location: [facility];  Service: Ophthalmology;  Laterality: Left;  CDE 15.79  . Cataract extraction w/phaco Right 02/08/2014    Procedure: CATARACT EXTRACTION PHACO AND INTRAOCULAR LENS PLACEMENT (IOC);  Surgeon: Elta Guadeloupe T. Gershon Crane, MD;  Location: [facility];  Service: Ophthalmology;  Laterality: Right;  CDE 4.41  . Total hip arthroplasty Left 06/07/2015    Procedure: LEFT TOTAL HIP ARTHROPLASTY ANTERIOR APPROACH;  Surgeon: Gaynelle Arabian, MD;  Location: [facility];  Service: Orthopedics;  Laterality: Left;    FAMILY HISTORY Family History  Problem Relation Age of Onset  . Cancer Mother     breast  . Heart disease Father   . Anesthesia problems Neg Hx    the patient's mother was diagnosed with breast  cancer at age 34, she died age 22 from congestive heart failure..The patient's father died from heart disease at age 80.  She has one sister alive & well.  Two brothers alive & well, one with diabetes. The patient's sister was also diagnosed with breast cancer, and was tested for the BRCA gene, and was negative. The patient herself has not been tested. There is no history of ovarian cancer in the family  GYNECOLOGIC HISTORY:  No LMP recorded. Patient is postmenopausal. Menarche age 54, the patient is GX P0. She stopped having periods with chemotherapy in 2005. 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 She was a Archivist. She is single, lives alone with her Shitzu-poodle Sammie.. Family is all in the [place] area.     ADVANCED DIRECTIVES: Not in place. At the 02/23/2014 visit the patient was given the appropriate documents to complete and notarize  at her discretion. She tells me she is planning to name her sister, Billie Ruddy, as healthcare power of attorney. [name] can be reached at [phone]   HEALTH MAINTENANCE: Social History  Substance Use Topics  . Smoking status: Former Smoker -- 1.50 packs/day for 30 years    Types: Cigarettes    Quit date: 09/10/2007  . Smokeless tobacco: Not on file  . Alcohol Use: No     Colonoscopy:  PAP:  Bone density: 08/11/2013, showed osteopenia  Lipid panel:  Allergies  Allergen Reactions  . Aspirin     REACTION: upset stomach  . Latex Other (See Comments)    Blistering and skin peels off  . Nsaids Nausea And Vomiting    Extreme nausea and vomiting    Current Outpatient Prescriptions  Medication Sig Dispense Refill  . cholecalciferol (VITAMIN D) 1000 units tablet Take 1,000 Units by mouth 2 (two) times daily.    Marland Kitchen gemfibrozil (LOPID) 600 MG tablet TAKE ONE TABLET BY MOUTH TWICE DAILY BEFORE  A  MEAL 60 tablet 0  .  Investigational everolimus (RAD001) 5 MG tablet Novartis [id number] Take 2 tablets by mouth daily. Take with a glass of water. 70 tablet 0  . letrozole (FEMARA) 2.5 MG tablet TAKE ONE TABLET BY MOUTH ONCE DAILY 90 tablet 0  . loperamide (IMODIUM) 2 MG capsule Take 2 mg by mouth 4 (four) times daily as needed for diarrhea or loose stools. Reported on 07/13/2015    . metoprolol tartrate (LOPRESSOR) 25 MG tablet Take 0.5 tablets (12.5 mg total) by mouth daily. 30 tablet 4  . prochlorperazine (COMPAZINE) 10 MG tablet Take 10 mg by mouth every 6 (six) hours as needed for nausea or vomiting. Reported on 07/13/2015    . methocarbamol (ROBAXIN) 500 MG tablet Take 1 tablet (500 mg total) by mouth every 6 (six) hours as needed for muscle spasms. (Patient not taking: Reported on 08/10/2015) 90 tablet 0  . omeprazole (PRILOSEC) 40 MG capsule Take 1 capsule (40 mg total) by mouth daily as needed. (Patient not taking: Reported on 08/10/2015) 90 capsule 3  . traMADol (ULTRAM) 50 MG tablet Take 1-2 tablets (50-100 mg total) by mouth every 6 (six) hours as needed (mild pain). Pt taking 2 tablets at bedtime only 10 tablet 0   No current facility-administered medications for this visit.    OBJECTIVE: Middle-aged white woman using a walker Filed Vitals:   08/10/15 0840 08/10/15 0841  BP: 123/70 128/66  Pulse: 95 93  Temp:    Resp:       Body mass index is 28.69 kg/(m^2).    ECOG FS:1 - Symptomatic but completely ambulatory  Skin: warm, dry  HEENT: sclerae anicteric, conjunctivae pink, oropharynx clear. No thrush or mucositis.  Lymph Nodes: No cervical or supraclavicular lymphadenopathy  Lungs: clear to auscultation bilaterally, no rales, wheezes, or rhonci  Heart: regular rate and rhythm  Abdomen: round, soft, non tender, positive bowel sounds  Musculoskeletal: No focal spinal tenderness, gait disturbance due to left foot pain Neuro: non focal, well oriented, positive affect  Breasts: deferred  LAB  RESULTS:  CMP     Component Value Date/Time   NA 140 08/10/2015 0825   NA 131* 06/08/2015 0526   K 3.9 08/10/2015 0825   K 4.0 06/08/2015 0526   CL 100* 06/08/2015 0526   CL 109* 12/31/2012 0940   CO2 19* 08/10/2015 0825   CO2 21* 06/08/2015 0526   GLUCOSE 135 08/10/2015 0825   GLUCOSE 339* 06/08/2015  0526   GLUCOSE 127* 12/31/2012 0940   BUN 11.2 08/10/2015 0825   BUN 17 06/08/2015 0526   CREATININE 0.9 08/10/2015 0825   CREATININE 1.07* 06/08/2015 0526   CALCIUM 9.9 08/10/2015 0825   CALCIUM 8.7* 06/08/2015 0526   PROT 7.6 08/10/2015 0825   PROT 7.4 05/29/2015 1415   ALBUMIN 3.3* 08/10/2015 0825   ALBUMIN 3.6 05/29/2015 1415   AST 34 08/10/2015 0825   AST 51* 05/29/2015 1415   ALT 25 08/10/2015 0825   ALT 39 05/29/2015 1415   ALKPHOS 187* 08/10/2015 0825   ALKPHOS 203* 05/29/2015 1415   BILITOT 0.32 08/10/2015 0825   BILITOT 0.3 05/29/2015 1415   GFRNONAA 54* 06/08/2015 0526   GFRAA >60 06/08/2015 0526    I No results found for: SPEP  Lab Results  Component Value Date   WBC 9.0 08/10/2015   NEUTROABS 6.8* 08/10/2015   HGB 12.6 08/10/2015   HCT 38.5 08/10/2015   MCV 75.3* 08/10/2015   PLT 332 08/10/2015      Chemistry      Component Value Date/Time   NA 140 08/10/2015 0825   NA 131* 06/08/2015 0526   K 3.9 08/10/2015 0825   K 4.0 06/08/2015 0526   CL 100* 06/08/2015 0526   CL 109* 12/31/2012 0940   CO2 19* 08/10/2015 0825   CO2 21* 06/08/2015 0526   BUN 11.2 08/10/2015 0825   BUN 17 06/08/2015 0526   CREATININE 0.9 08/10/2015 0825   CREATININE 1.07* 06/08/2015 0526      Component Value Date/Time   CALCIUM 9.9 08/10/2015 0825   CALCIUM 8.7* 06/08/2015 0526   ALKPHOS 187* 08/10/2015 0825   ALKPHOS 203* 05/29/2015 1415   AST 34 08/10/2015 0825   AST 51* 05/29/2015 1415   ALT 25 08/10/2015 0825   ALT 39 05/29/2015 1415   BILITOT 0.32 08/10/2015 0825   BILITOT 0.3 05/29/2015 1415       Lab Results  Component Value Date   LABCA2 58*  09/14/2012    No components found for: [id number]  No results for input(s): INR in the last 168 hours.  Urinalysis    Component Value Date/Time   COLORURINE YELLOW 05/29/2015 1420   APPEARANCEUR CLEAR 05/29/2015 1420   LABSPEC 1.030 07/13/2015 0841   LABSPEC 1.019 05/29/2015 1420   PHURINE 6.0 07/13/2015 0841   PHURINE 5.5 05/29/2015 1420   GLUCOSEU Negative 07/13/2015 0841   GLUCOSEU NEGATIVE 05/29/2015 1420   HGBUR Large 07/13/2015 0841   HGBUR MODERATE* 05/29/2015 1420   BILIRUBINUR Negative 07/13/2015 0841   BILIRUBINUR NEGATIVE 05/29/2015 1420   KETONESUR Negative 07/13/2015 [place] 05/29/2015 1420   PROTEINUR 300 07/13/2015 0841   PROTEINUR 30* 05/29/2015 1420   UROBILINOGEN 0.2 07/13/2015 0841   UROBILINOGEN 1.0 05/29/2015 1420   NITRITE Negative 07/13/2015 0841   NITRITE NEGATIVE 05/29/2015 1420   LEUKOCYTESUR Negative 07/13/2015 0841   LEUKOCYTESUR NEGATIVE 05/29/2015 1420    STUDIES: Ct Chest W Contrast  08/08/2015  CLINICAL DATA:  65 year old female with history breast cancer diagnosed in 2005, with recurrence in 2013, status post right mastectomy. Oral chemotherapy in progress. No current complaints. RECIST protocol. EXAM: CT CHEST, ABDOMEN, AND PELVIS WITH CONTRAST TECHNIQUE: Multidetector CT imaging of the chest, abdomen and pelvis was performed following the standard protocol during bolus administration of intravenous contrast. CONTRAST:  138m OMNIPAQUE IOHEXOL 300 MG/ML SOLN, 531mOMNIPAQUE IOHEXOL 300 MG/ML SOLN COMPARISON:  CT the chest, abdomen and pelvis 06/13/2015. FINDINGS: RECIST 1.1  Target Lesions: 1. No target lesions under RECIST 1.1 criteria. Non-target Lesions: 1. Persistent small amount of right-sided pleural thickening and trace right pleural effusion medially, unchanged. CT CHEST FINDINGS Mediastinum/Lymph Nodes: Heart size is normal. There is no significant pericardial fluid, thickening or pericardial calcification. No  pathologically enlarged mediastinal or hilar lymph nodes. Esophagus is unremarkable in appearance. No axillary lymphadenopathy. Status post right axillary lymph node dissection. Lungs/Pleura: Ill-defined 5 mm ground-glass attenuation nodule in the periphery of the right lower lobe (image 40 of series 4), slightly less apparent than the prior study, presumably benign. Trace amount of chronic pleural fluid in the medial aspect of the lower right hemithorax, unchanged. No other suspicious appearing pulmonary nodules or masses. No acute consolidative airspace disease. No left pleural effusion. Musculoskeletal/Soft Tissues: Status post right modified radical mastectomy and right axillary lymph node dissection. No evidence of soft tissue mass along the right chest wall to suggest local recurrence of disease. There are no aggressive appearing lytic or blastic lesions noted in the visualized portions of the skeleton. CT ABDOMEN AND PELVIS FINDINGS Hepatobiliary: Diffuse low attenuation throughout the hepatic parenchyma, compatible with hepatic steatosis. No suspicious cystic or solid hepatic lesions. No intra or extrahepatic biliary ductal dilatation. Gallbladder is normal in appearance. Pancreas: No pancreatic mass. No pancreatic ductal dilatation. No pancreatic or peripancreatic fluid or inflammatory changes. Spleen: Unremarkable. Adrenals/Urinary Tract: 1 cm nodule in the lateral limb of the left adrenal gland is unchanged compared to prior studies, and although incompletely characterized on today's CT examination, stability compared to numerous prior examinations strongly favors a benign adenoma. Right adrenal gland and bilateral kidneys are normal in appearance. No hydroureteronephrosis. Urinary bladder is normal in appearance. Stomach/Bowel: Normal appearance of the stomach. No pathologic dilatation of small bowel or colon. Normal appendix. Vascular/Lymphatic: Atherosclerosis throughout the abdominal and pelvic  vasculature, without evidence of aneurysm or dissection. No lymphadenopathy noted in the abdomen or pelvis. Reproductive: Uterus and ovaries are atrophic. Other: No significant volume of ascites.  No pneumoperitoneum. Musculoskeletal: Status post left hip arthroplasty. Sclerotic lesions in the anterior aspect of the left ilium (image 94 of series 2) and in the left sacrum (image 96 of series 2) unchanged over numerous prior examinations, most likely to represent treated metastatic lesions. IMPRESSION: 1. Status post right modified radical mastectomy and right axillary lymph node dissection, without evidence to suggest local recurrence of disease or metastatic disease in the chest, abdomen or pelvis. 2. 5 mm nodule in the right lower lobe is slightly less apparent and more ground-glass attenuation in appearance than the prior examination, presumably benign. 3. Unchanged sclerotic lesions in the left hemipelvis, similar to numerous prior examinations, most likely to represent treated metastases. 4. Hepatic steatosis. 5. Stable 1 cm left adrenal nodule, presumably a tiny adenoma. Electronically Signed   By: Vinnie Langton M.D.   On: 08/08/2015 14:17   Ct Abdomen Pelvis W Contrast  08/08/2015  CLINICAL DATA:  65 year old female with history breast cancer diagnosed in 2005, with recurrence in 2013, status post right mastectomy. Oral chemotherapy in progress. No current complaints. RECIST protocol. EXAM: CT CHEST, ABDOMEN, AND PELVIS WITH CONTRAST TECHNIQUE: Multidetector CT imaging of the chest, abdomen and pelvis was performed following the standard protocol during bolus administration of intravenous contrast. CONTRAST:  132m OMNIPAQUE IOHEXOL 300 MG/ML SOLN, 536mOMNIPAQUE IOHEXOL 300 MG/ML SOLN COMPARISON:  CT the chest, abdomen and pelvis 06/13/2015. FINDINGS: RECIST 1.1 Target Lesions: 1. No target lesions under RECIST 1.1 criteria. Non-target Lesions: 1. Persistent  small amount of right-sided pleural  thickening and trace right pleural effusion medially, unchanged. CT CHEST FINDINGS Mediastinum/Lymph Nodes: Heart size is normal. There is no significant pericardial fluid, thickening or pericardial calcification. No pathologically enlarged mediastinal or hilar lymph nodes. Esophagus is unremarkable in appearance. No axillary lymphadenopathy. Status post right axillary lymph node dissection. Lungs/Pleura: Ill-defined 5 mm ground-glass attenuation nodule in the periphery of the right lower lobe (image 40 of series 4), slightly less apparent than the prior study, presumably benign. Trace amount of chronic pleural fluid in the medial aspect of the lower right hemithorax, unchanged. No other suspicious appearing pulmonary nodules or masses. No acute consolidative airspace disease. No left pleural effusion. Musculoskeletal/Soft Tissues: Status post right modified radical mastectomy and right axillary lymph node dissection. No evidence of soft tissue mass along the right chest wall to suggest local recurrence of disease. There are no aggressive appearing lytic or blastic lesions noted in the visualized portions of the skeleton. CT ABDOMEN AND PELVIS FINDINGS Hepatobiliary: Diffuse low attenuation throughout the hepatic parenchyma, compatible with hepatic steatosis. No suspicious cystic or solid hepatic lesions. No intra or extrahepatic biliary ductal dilatation. Gallbladder is normal in appearance. Pancreas: No pancreatic mass. No pancreatic ductal dilatation. No pancreatic or peripancreatic fluid or inflammatory changes. Spleen: Unremarkable. Adrenals/Urinary Tract: 1 cm nodule in the lateral limb of the left adrenal gland is unchanged compared to prior studies, and although incompletely characterized on today's CT examination, stability compared to numerous prior examinations strongly favors a benign adenoma. Right adrenal gland and bilateral kidneys are normal in appearance. No hydroureteronephrosis. Urinary bladder is  normal in appearance. Stomach/Bowel: Normal appearance of the stomach. No pathologic dilatation of small bowel or colon. Normal appendix. Vascular/Lymphatic: Atherosclerosis throughout the abdominal and pelvic vasculature, without evidence of aneurysm or dissection. No lymphadenopathy noted in the abdomen or pelvis. Reproductive: Uterus and ovaries are atrophic. Other: No significant volume of ascites.  No pneumoperitoneum. Musculoskeletal: Status post left hip arthroplasty. Sclerotic lesions in the anterior aspect of the left ilium (image 94 of series 2) and in the left sacrum (image 96 of series 2) unchanged over numerous prior examinations, most likely to represent treated metastatic lesions. IMPRESSION: 1. Status post right modified radical mastectomy and right axillary lymph node dissection, without evidence to suggest local recurrence of disease or metastatic disease in the chest, abdomen or pelvis. 2. 5 mm nodule in the right lower lobe is slightly less apparent and more ground-glass attenuation in appearance than the prior examination, presumably benign. 3. Unchanged sclerotic lesions in the left hemipelvis, similar to numerous prior examinations, most likely to represent treated metastases. 4. Hepatic steatosis. 5. Stable 1 cm left adrenal nodule, presumably a tiny adenoma. Electronically Signed   By: Vinnie Langton M.D.   On: 08/08/2015 14:17      ASSESSMENT: 65 y.o. [place], [place] woman with stage IV breast cancer, on BOLERO-4 trial  (1) status post right mastectomy and sentinel lymph node sampling 03/22/2004 for a pT2 pN1, stage IIB invasive ductal carcinoma with lobular features, grade 2, estrogen receptor and progesterone receptor positive, HER-2 negative, with an MIB-1 of 9% ([id number] and [id number])  (2) addition all right axillary lymph node sampling 05/07/2004 showed 2 benign lymph nodes (4 lymph nodes previously removed, so total was one positive lymph node out of 6; [id number])  (3) the  patient was evaluated by radiation oncology; no postmastectomy radiation recommended  (4) adjuvant chemotherapy with dose dense doxorubicin and cyclophosphamide x4 cycles (first cycle delayed one week)  followed by dose dense paclitaxel x4 was completed 09/18/2004  (5) tamoxifen started March 2006, discontinued 2009  METASTATIC DISEASE: (6) presenting with a large pericardial effusion, large right pleural effusion and possible right middle lobe bronchial obstruction February 2013, status post pericardial window placement, fiberoptic bronchoscopy and right Pleurx placement 09/11/2011, with biopsy of the bronchus intermedius and pericardium positive for metastatic breast cancer, estrogen receptor 91% positive with moderate staining intensity, progesterone receptor 100% positive with strong staining intensity, with an MIB-1 of 35%, and no HER-2 amplification, the signals ratio being 1.37 ([id number])  (7) enrolled in BOLERO-4 trial 10/10/2011, receiving letrozole and everolimus  (a) two small areas of enhancement in the cerebellum noted by brain MRI 10/03/2011 were no longer apparent on repeat MRI 08/21/2012-- most recent brain MRI 04/01/2014 showed no evidence of intracranial metastatic disease  (b) sclerotic lesions in left iliac bone and sacrum have not been biopsied; stable; plan is to start zolendronate after patient updates her dental care (extraction planned)  (c) RLL lung nodule, stable (rescanned 07/12/2014 and 08/11/2014) R hilar and subcarinal lymph nodes: stable  (9) additional problems:  (a) hepatic steatosis  (b) COPD/ emphysema/ asthma  (c) advanced L hip osteoarthritis, status post left total hip replacement 06/07/2015  (d) aortoiliac atherosclerosis  (e) dental evaluation pending w possible dental extractions  (f) possible thalassemia trait  (10) Bone density concerns: DEXA scan 08/11/2013 was normal   PLAN: Channah's tendonitis is going to be related to deconditioning of her foot  muscles, since she has applied very little pressure to this side because of her hip troubles in the past. She follows up with Dr. Maureen Ralphs this upcoming Tuesday. I have given her a small quantity of tramadol to help her make it to this visit.   We reviewed the results of her recent CT scans and they show no evidence of a local recurrence or metastatic disease. The CBC and CMET were reviewed in detail and were stable. She will continue on the BOLERO-4 trial with letrozole and everolimus as planned.   She will have a bone density scan on Monday. She has restarted her vitamin D 1000 unit BID supplement.  Analyce will return in 4 weeks for labs and a follow up visit. She understands and agrees with this plan. She knows the goal of treatment in her case is control. She has been encouraged to call with any issues that might arise before her next visit here.    Laurie Panda, NP   08/10/2015 9:05 AM

## 2015-08-11 LAB — LIPID PANEL
CHOL/HDL RATIO: 4.2 ratio (ref 0.0–4.4)
Cholesterol, Total: 208 mg/dL — ABNORMAL HIGH (ref 100–199)
HDL: 50 mg/dL (ref >39)
LDL Calculated: 115 mg/dL — ABNORMAL HIGH (ref 0–99)
TRIGLYCERIDES: 217 mg/dL — AB (ref 0–149)
VLDL CHOLESTEROL CAL: 43 mg/dL — AB (ref 5–40)

## 2015-08-11 LAB — CK: Creatine Kinase Total: 47 U/L (ref 24–173)

## 2015-08-14 ENCOUNTER — Ambulatory Visit
Admission: RE | Admit: 2015-08-14 | Discharge: 2015-08-14 | Disposition: A | Discharge status: Home / Self Care | Payer: Medicare Other | Source: Ambulatory Visit | Attending: Nurse Practitioner | Admitting: Nurse Practitioner

## 2015-08-14 DIAGNOSIS — M85851 Other specified disorders of bone density and structure, right thigh: Secondary | ICD-10-CM | POA: Diagnosis not present

## 2015-08-14 DIAGNOSIS — M8580 Other specified disorders of bone density and structure, unspecified site: Secondary | ICD-10-CM

## 2015-08-14 DIAGNOSIS — E2839 Other primary ovarian failure: Secondary | ICD-10-CM

## 2015-08-15 DIAGNOSIS — Z471 Aftercare following joint replacement surgery: Secondary | ICD-10-CM | POA: Diagnosis not present

## 2015-08-15 DIAGNOSIS — Z96642 Presence of left artificial hip joint: Secondary | ICD-10-CM | POA: Diagnosis not present

## 2015-08-23 ENCOUNTER — Other Ambulatory Visit: Payer: Self-pay | Admitting: Oncology

## 2015-09-05 ENCOUNTER — Telehealth: Payer: Self-pay

## 2015-09-05 ENCOUNTER — Telehealth: Payer: Self-pay | Admitting: Oncology

## 2015-09-05 ENCOUNTER — Other Ambulatory Visit: Payer: Self-pay | Admitting: *Deleted

## 2015-09-05 DIAGNOSIS — C7800 Secondary malignant neoplasm of unspecified lung: Principal | ICD-10-CM

## 2015-09-05 DIAGNOSIS — C50911 Malignant neoplasm of unspecified site of right female breast: Secondary | ICD-10-CM

## 2015-09-05 NOTE — Telephone Encounter (Signed)
Patient is presently at the dentist for a cleaning and Kelly from the dental office called to see if patient was ok to go ahead for a cleaning.  Per Dr. Jana Hakim patient can go ahead and have her teeth cleaned.

## 2015-09-05 NOTE — Telephone Encounter (Signed)
LFt msg for pt confirming updated schedule and mailed out schedule... KJ

## 2015-09-07 ENCOUNTER — Ambulatory Visit (HOSPITAL_BASED_OUTPATIENT_CLINIC_OR_DEPARTMENT_OTHER): Payer: Medicare Other | Admitting: Oncology

## 2015-09-07 ENCOUNTER — Other Ambulatory Visit (HOSPITAL_COMMUNITY)
Admission: AD | Admit: 2015-09-07 | Discharge: 2015-09-07 | Disposition: A | Discharge status: Home / Self Care | Payer: Medicare Other | Source: Ambulatory Visit | Attending: Oncology | Admitting: Oncology

## 2015-09-07 ENCOUNTER — Other Ambulatory Visit: Payer: Self-pay | Admitting: *Deleted

## 2015-09-07 ENCOUNTER — Encounter: Payer: Self-pay | Admitting: *Deleted

## 2015-09-07 ENCOUNTER — Other Ambulatory Visit (HOSPITAL_BASED_OUTPATIENT_CLINIC_OR_DEPARTMENT_OTHER): Payer: Medicare Other

## 2015-09-07 VITALS — BP 121/66 | HR 93 | Temp 97.8°F | Resp 18 | Ht 64.0 in | Wt 163.0 lb

## 2015-09-07 DIAGNOSIS — C50911 Malignant neoplasm of unspecified site of right female breast: Secondary | ICD-10-CM

## 2015-09-07 DIAGNOSIS — M8580 Other specified disorders of bone density and structure, unspecified site: Secondary | ICD-10-CM

## 2015-09-07 DIAGNOSIS — M25569 Pain in unspecified knee: Secondary | ICD-10-CM | POA: Diagnosis not present

## 2015-09-07 DIAGNOSIS — J45909 Unspecified asthma, uncomplicated: Secondary | ICD-10-CM

## 2015-09-07 DIAGNOSIS — Z006 Encounter for examination for normal comparison and control in clinical research program: Secondary | ICD-10-CM | POA: Diagnosis not present

## 2015-09-07 DIAGNOSIS — I313 Pericardial effusion (noninflammatory): Secondary | ICD-10-CM

## 2015-09-07 DIAGNOSIS — I3131 Malignant pericardial effusion in diseases classified elsewhere: Secondary | ICD-10-CM

## 2015-09-07 DIAGNOSIS — Z79811 Long term (current) use of aromatase inhibitors: Secondary | ICD-10-CM | POA: Diagnosis not present

## 2015-09-07 DIAGNOSIS — C7800 Secondary malignant neoplasm of unspecified lung: Secondary | ICD-10-CM

## 2015-09-07 DIAGNOSIS — N951 Menopausal and female climacteric states: Secondary | ICD-10-CM

## 2015-09-07 DIAGNOSIS — C50811 Malignant neoplasm of overlapping sites of right female breast: Secondary | ICD-10-CM | POA: Diagnosis not present

## 2015-09-07 DIAGNOSIS — Z9221 Personal history of antineoplastic chemotherapy: Secondary | ICD-10-CM | POA: Diagnosis not present

## 2015-09-07 DIAGNOSIS — C50919 Malignant neoplasm of unspecified site of unspecified female breast: Secondary | ICD-10-CM | POA: Insufficient documentation

## 2015-09-07 DIAGNOSIS — C773 Secondary and unspecified malignant neoplasm of axilla and upper limb lymph nodes: Secondary | ICD-10-CM | POA: Diagnosis not present

## 2015-09-07 DIAGNOSIS — M899 Disorder of bone, unspecified: Secondary | ICD-10-CM | POA: Diagnosis not present

## 2015-09-07 DIAGNOSIS — C50411 Malignant neoplasm of upper-outer quadrant of right female breast: Secondary | ICD-10-CM

## 2015-09-07 DIAGNOSIS — C801 Malignant (primary) neoplasm, unspecified: Secondary | ICD-10-CM

## 2015-09-07 DIAGNOSIS — C7989 Secondary malignant neoplasm of other specified sites: Secondary | ICD-10-CM

## 2015-09-07 LAB — COMPREHENSIVE METABOLIC PANEL
ALT: 24 U/L (ref 0–55)
ANION GAP: 12 meq/L — AB (ref 3–11)
AST: 33 U/L (ref 5–34)
Albumin: 3.6 g/dL (ref 3.5–5.0)
Alkaline Phosphatase: 234 U/L — ABNORMAL HIGH (ref 40–150)
BUN: 20.9 mg/dL (ref 7.0–26.0)
CO2: 16 meq/L — AB (ref 22–29)
Calcium: 10.2 mg/dL (ref 8.4–10.4)
Chloride: 108 mEq/L (ref 98–109)
Creatinine: 0.9 mg/dL (ref 0.6–1.1)
EGFR: 68 mL/min/{1.73_m2} — AB (ref >90)
GLUCOSE: 149 mg/dL — AB (ref 70–140)
POTASSIUM: 4.2 meq/L (ref 3.5–5.1)
SODIUM: 137 meq/L (ref 136–145)
Total Protein: 8.3 g/dL (ref 6.4–8.3)

## 2015-09-07 LAB — LIPID PANEL
CHOL/HDL RATIO: 4.7 ratio — AB (ref 0.0–4.4)
CHOLESTEROL TOTAL: 244 mg/dL — AB (ref 100–199)
HDL: 52 mg/dL (ref >39)
LDL Calculated: 130 mg/dL — ABNORMAL HIGH (ref 0–99)
TRIGLYCERIDES: 309 mg/dL — AB (ref 0–149)
VLDL CHOLESTEROL CAL: 62 mg/dL — AB (ref 5–40)

## 2015-09-07 LAB — CBC WITH DIFFERENTIAL/PLATELET
BASO%: 0.6 % (ref 0.0–2.0)
BASOS ABS: 0 10*3/uL (ref 0.0–0.1)
EOS ABS: 0.2 10*3/uL (ref 0.0–0.5)
EOS%: 2.9 % (ref 0.0–7.0)
HCT: 40.9 % (ref 34.8–46.6)
HGB: 13.5 g/dL (ref 11.6–15.9)
LYMPH%: 13.9 % — AB (ref 14.0–49.7)
MCH: 25.3 pg (ref 25.1–34.0)
MCHC: 33 g/dL (ref 31.5–36.0)
MCV: 76.6 fL — AB (ref 79.5–101.0)
MONO#: 0.9 10*3/uL (ref 0.1–0.9)
MONO%: 13.6 % (ref 0.0–14.0)
NEUT#: 4.3 10*3/uL (ref 1.5–6.5)
NEUT%: 69 % (ref 38.4–76.8)
PLATELETS: 324 10*3/uL (ref 145–400)
RBC: 5.34 10*6/uL (ref 3.70–5.45)
RDW: 15.4 % — ABNORMAL HIGH (ref 11.2–14.5)
WBC: 6.3 10*3/uL (ref 3.9–10.3)
lymph#: 0.9 10*3/uL (ref 0.9–3.3)

## 2015-09-07 LAB — URIC ACID: Uric Acid, Serum: 2.9 mg/dl (ref 2.6–7.4)

## 2015-09-07 LAB — BILIRUBIN, DIRECT

## 2015-09-07 LAB — CK: CK TOTAL: 40 U/L (ref 38–234)

## 2015-09-07 LAB — PHOSPHORUS: Phosphorus: 3 mg/dL (ref 2.5–4.6)

## 2015-09-07 MED ORDER — PROCHLORPERAZINE MALEATE 10 MG PO TABS
10.0000 mg | ORAL_TABLET | Freq: Four times a day (QID) | ORAL | Status: DC | PRN
Start: 2015-09-07 — End: 2015-10-05

## 2015-09-07 MED ORDER — PROCHLORPERAZINE 25 MG RE SUPP: 25.0000 mg | Freq: Two times a day (BID) | RECTAL | Status: DC | PRN

## 2015-09-07 MED ORDER — INV-EVEROLIMUS (RAD0001) 5MG TABLET NOVARTIS CRAD001Y24135: 2.0000 | ORAL_TABLET | Freq: Every day | ORAL | Status: DC

## 2015-09-07 MED ORDER — LETROZOLE 2.5 MG PO TABS: 2.5000 mg | ORAL_TABLET | Freq: Every day | ORAL | Status: DC

## 2015-09-07 MED ORDER — LORAZEPAM 0.5 MG PO TABS: 0.5000 mg | ORAL_TABLET | Freq: Four times a day (QID) | ORAL | Status: DC | PRN

## 2015-09-07 MED ORDER — ONDANSETRON HCL 8 MG PO TABS: 8.0000 mg | ORAL_TABLET | Freq: Two times a day (BID) | ORAL | Status: DC | PRN

## 2015-09-07 NOTE — Progress Notes (Signed)
[place]  [phone] [phone]     ID: Barbara Parks DOB: [birth year]  MR#: [id number]  HUD#:[id number]  Patient Care Team: Darcus Austin, MD as PCP - General (Family Medicine) Jerline Pain, MD (Cardiology) Gaye Pollack, MD (Cardiothoracic Surgery) Chauncey Cruel, MD as Consulting Physician (Oncology) OTHER MD: Dr Erroll Luna- Merlene Laughter, DDS; Gaynelle Arabian MD  CHIEF COMPLAINT: metastatic breast cancer, on BOLERO study  CURRENT TREATMENT: letrozole + everolimus  BREAST CANCER HISTORY: From Dr. Collier Salina Parks's original intake note 04/13/2004:  "This woman has been in good health all of her life. She recently moved from [place] to work here.  She palpated a mass at about the 12 o'clock position in mid-July. She has not noticed any nipple retraction or skin changes.  She was seen by her primary care doctor who subsequently referred her for a mammogram.  Mammogram was performed on 03/01/04. This demonstrated a spiculated 2 cm mass at the 12 o'clock position in the right breast.  Upper outer quadrant of the left breast shows some distortion.   Physical exam at that time showed a firm, nontender nodule at the 12 o'clock position in the right breast, 5 cm from the nipple.  Ultrasound of this area showed a hypoechoic ill-defined mass, measuring 2.2 x 1.3 x 1.4 cm.  Physical exam of the left breast showed general vague thickening, upper outer quadrant of the left breast with a discrete palpable mass. The ultrasound performed showed a single hypoechoic ill-defined nodule at the 1 o'clock position, measuring 7 x 5 x 6 mm.  She had biopsies of both lesions on 03/02/03.  Needle core biopsy of the lesion on the right breast revealed invasive mammary carcinoma.  Needle core biopsy of the left breast showed a complex fibroadenoma.  Prognostic panel of the lesion on the right breast showed it to be ER positive at 73%, PR positive at 90% and proliferative index 9%, HER-2  was 1+.  Patient was referred to Dr. Annamaria Boots, who performed a simple mastectomy with sentinel lymph node evaluation on 03/22/04.  Final pathology showed this to be an invasive ductal carcinoma  with lobular features, measuring 2.2 cm, grade 2 of 3.  Margins negative for carcinoma.  Invasive ductal carcinoma was extended to involve deep dermis of the nipple.  Lymphovascular invasion was identified.  Total of 4 sentinel lymph nodes were evaluated.  Touch imprints at the time of the OR was felt to be negative.  Subsequent evaluation showed a 5 mm focus of metastatic carcinoma in one of the four lymph nodes on microscopic after sectioning.  There was extracapsular extension  of one lymph node as well. The remaining three lymph nodes were all negative."  The patient's subsequent history is detailed below.  INTERVAL HISTORY: Barbara Parks returns today for follow up of her stage IV estrogen receptor positive breast cancer. research nurse, Barbara Parks, was also present.Barbara Parks is currently on cycle 52 of the BOLERO-4 trial, receiving letrozole and everolimus. She has a little bit of skin dryness and some nail changes which she attributes to the letrozole. More importantly she's had no mucositis or pneumonitis evidence and no significant fatigue associated with the everolimus.  REVIEW OF SYSTEMS: Barbara Parks just had her left hip done area she did quite well with the surgery. She had some problems with rehabilitation. Now however she is having significant knee problems. This is affecting her walk. She was also having some foot issues but that is already improved. Since  her last visit here she also had a bone density scan, which shows mild osteopenia. Aside from this problem, a detailed review of systems today was entirely benign.  PAST MEDICAL HISTORY: Past Medical History  Diagnosis Date  . Shortness of breath   . Arthritis     left hip  . Osteopenia due to cancer therapy 09/09/2013  . Hypercholesteremia   . breast ca  2005    breast/chemo R mastectomy  . Metastasis to lung ([place]) dx'd 08/2011  . GERD (gastroesophageal reflux disease)     PAST SURGICAL HISTORY: Past Surgical History  Procedure Laterality Date  . Spine surgery  1996  . Breast surgery  2005    right  . Video bronchoscopy  09/11/2011    Procedure: VIDEO BRONCHOSCOPY;  Surgeon: Gaye Pollack, MD;  Location: [facility];  Service: Thoracic;  Laterality: N/A;  . Chest tube insertion  09/11/2011    Procedure: INSERTION PLEURAL DRAINAGE CATHETER;  Surgeon: Gaye Pollack, MD;  Location: [facility];  Service: Thoracic;  Laterality: Right;  . Pericardial window  09/11/2011    Procedure: PERICARDIAL WINDOW;  Surgeon: Gaye Pollack, MD;  Location: [facility];  Service: Thoracic;  Laterality: N/A;  . Removal of pleural drainage catheter  12/19/2011    Procedure: REMOVAL OF PLEURAL DRAINAGE CATHETER;  Surgeon: Gaye Pollack, MD;  Location: [facility];  Service: Thoracic;  Laterality: Right;  TO BE DONE IN MINOR ROOM, SHORT STAY  . Cataract extraction w/phaco Left 01/18/2014    Procedure: CATARACT EXTRACTION PHACO AND INTRAOCULAR LENS PLACEMENT (IOC);  Surgeon: Elta Guadeloupe T. Gershon Crane, MD;  Location: [facility];  Service: Ophthalmology;  Laterality: Left;  CDE 15.79  . Cataract extraction w/phaco Right 02/08/2014    Procedure: CATARACT EXTRACTION PHACO AND INTRAOCULAR LENS PLACEMENT (IOC);  Surgeon: Elta Guadeloupe T. Gershon Crane, MD;  Location: [facility];  Service: Ophthalmology;  Laterality: Right;  CDE 4.41  . Total hip arthroplasty Left 06/07/2015    Procedure: LEFT TOTAL HIP ARTHROPLASTY ANTERIOR APPROACH;  Surgeon: Gaynelle Arabian, MD;  Location: [facility];  Service: Orthopedics;  Laterality: Left;    FAMILY HISTORY Family History  Problem Relation Age of Onset  . Cancer Mother     breast  . Heart disease Father   . Anesthesia problems Neg Hx    the patient's mother was diagnosed with breast cancer at age 34, she died age 22 from congestive heart failure..The patient's father died from heart disease  at age 69.  She has one sister alive & well.  Two brothers alive & well, one with diabetes. The patient's sister was also diagnosed with breast cancer, and was tested for the BRCA gene, and was negative. The patient herself has not been tested. There is no history of ovarian cancer in the family  GYNECOLOGIC HISTORY:  No LMP recorded. Patient is postmenopausal. Menarche age 4, the patient is GX P0. She stopped having periods with chemotherapy in 2005. 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 She was a Archivist. She is single, lives alone with her Shitzu-poodle Sammie.. Family is all in the [place] area.     ADVANCED DIRECTIVES: Not in place. At the 02/23/2014 visit the patient was given the appropriate documents to complete and notarize at her discretion. She tells me she is planning to name her sister, Billie Ruddy, as healthcare power of attorney. [name] can be  reached at [phone]   HEALTH MAINTENANCE: Social History  Substance Use Topics  . Smoking status: Former Smoker -- 1.50 packs/day for 30 years    Types: Cigarettes    Quit date: 09/10/2007  . Smokeless tobacco: Not on file  . Alcohol Use: No     Colonoscopy:  PAP:  Bone density: 08/14/2015, T score -1.6  Lipid panel:  Allergies  Allergen Reactions  . Aspirin     REACTION: upset stomach  . Latex Other (See Comments)    Blistering and skin peels off  . Nsaids Nausea And Vomiting    Extreme nausea and vomiting    Current Outpatient Prescriptions  Medication Sig Dispense Refill  . cholecalciferol (VITAMIN D) 1000 units tablet Take 1,000 Units by mouth 2 (two) times daily.    Marland Kitchen gemfibrozil (LOPID) 600 MG tablet TAKE ONE TABLET BY MOUTH TWICE DAILY BEFORE A MEAL 60 tablet 0  . Investigational everolimus (RAD001) 5 MG tablet Novartis [id number] Take 2 tablets by mouth daily. Take with a glass  of water. 70 tablet 0  . Investigational everolimus (RAD001) 5 MG tablet Novartis [id number] Take 2 tablets by mouth daily. Take with a glass of water. 70 tablet 0  . letrozole (FEMARA) 2.5 MG tablet TAKE ONE TABLET BY MOUTH ONCE DAILY 90 tablet 0  . loperamide (IMODIUM) 2 MG capsule Take 2 mg by mouth 4 (four) times daily as needed for diarrhea or loose stools. Reported on 07/13/2015    . methocarbamol (ROBAXIN) 500 MG tablet Take 1 tablet (500 mg total) by mouth every 6 (six) hours as needed for muscle spasms. (Patient not taking: Reported on 08/10/2015) 90 tablet 0  . metoprolol tartrate (LOPRESSOR) 25 MG tablet Take 0.5 tablets (12.5 mg total) by mouth daily. 30 tablet 4  . omeprazole (PRILOSEC) 40 MG capsule Take 1 capsule (40 mg total) by mouth daily as needed. (Patient not taking: Reported on 08/10/2015) 90 capsule 3  . prochlorperazine (COMPAZINE) 10 MG tablet Take 10 mg by mouth every 6 (six) hours as needed for nausea or vomiting. Reported on 07/13/2015    . traMADol (ULTRAM) 50 MG tablet Take 1-2 tablets (50-100 mg total) by mouth every 6 (six) hours as needed (mild pain). Pt taking 2 tablets at bedtime only 10 tablet 0   No current facility-administered medications for this visit.    OBJECTIVE: Middle-aged white woman who appears stated age 64 Vitals:   09/07/15 0908 09/07/15 0909  BP: 114/69 121/66  Pulse: 95 93  Temp:    Resp:       Body mass index is 27.97 kg/(m^2).    ECOG FS:1 - Symptomatic but completely ambulatory  Sclerae unicteric, pupils round and equal Oropharynx clear and moist-- no thrush or other lesions No cervical or supraclavicular adenopathy Lungs no rales or rhonchi Heart regular rate and rhythm Abd soft, nontender, positive bowel sounds MSK no focal spinal tenderness, no upper extremity lymphedema; there is focal tenderness in the knee medially, without significant swelling or erythema. There is good range of motion Neuro: nonfocal, well oriented,  appropriate affect Breasts: The right breast is status post mastectomy. There is no evidence of local recurrence. The right axilla is benign. Left breast is unremarkable.   LAB RESULTS:  CMP     Component Value Date/Time   NA 137 09/07/2015 0847   NA 131* 06/08/2015 0526   K 4.2 09/07/2015 0847   K 4.0 06/08/2015 0526   CL 100* 06/08/2015 0526   CL 109*  12/31/2012 0940   CO2 16* 09/07/2015 0847   CO2 21* 06/08/2015 0526   GLUCOSE 149* 09/07/2015 0847   GLUCOSE 339* 06/08/2015 0526   GLUCOSE 127* 12/31/2012 0940   BUN 20.9 09/07/2015 0847   BUN 17 06/08/2015 0526   CREATININE 0.9 09/07/2015 0847   CREATININE 1.07* 06/08/2015 0526   CALCIUM 10.2 09/07/2015 0847   CALCIUM 8.7* 06/08/2015 0526   PROT 8.3 09/07/2015 0847   PROT 7.4 05/29/2015 1415   ALBUMIN 3.6 09/07/2015 0847   ALBUMIN 3.6 05/29/2015 1415   AST 33 09/07/2015 0847   AST 51* 05/29/2015 1415   ALT 24 09/07/2015 0847   ALT 39 05/29/2015 1415   ALKPHOS 234* 09/07/2015 0847   ALKPHOS 203* 05/29/2015 1415   BILITOT <0.30 09/07/2015 0847   BILITOT 0.3 05/29/2015 1415   GFRNONAA 54* 06/08/2015 0526   GFRAA >60 06/08/2015 0526    I No results found for: SPEP  Lab Results  Component Value Date   WBC 6.3 09/07/2015   NEUTROABS 4.3 09/07/2015   HGB 13.5 09/07/2015   HCT 40.9 09/07/2015   MCV 76.6* 09/07/2015   PLT 324 09/07/2015      Chemistry      Component Value Date/Time   NA 137 09/07/2015 0847   NA 131* 06/08/2015 0526   K 4.2 09/07/2015 0847   K 4.0 06/08/2015 0526   CL 100* 06/08/2015 0526   CL 109* 12/31/2012 0940   CO2 16* 09/07/2015 0847   CO2 21* 06/08/2015 0526   BUN 20.9 09/07/2015 0847   BUN 17 06/08/2015 0526   CREATININE 0.9 09/07/2015 0847   CREATININE 1.07* 06/08/2015 0526      Component Value Date/Time   CALCIUM 10.2 09/07/2015 0847   CALCIUM 8.7* 06/08/2015 0526   ALKPHOS 234* 09/07/2015 0847   ALKPHOS 203* 05/29/2015 1415   AST 33 09/07/2015 0847   AST 51* 05/29/2015  1415   ALT 24 09/07/2015 0847   ALT 39 05/29/2015 1415   BILITOT <0.30 09/07/2015 0847   BILITOT 0.3 05/29/2015 1415       Lab Results  Component Value Date   LABCA2 58* 09/14/2012    No components found for: [id number]  No results for input(s): INR in the last 168 hours.  Urinalysis    Component Value Date/Time   COLORURINE YELLOW 05/29/2015 1420   APPEARANCEUR CLEAR 05/29/2015 1420   LABSPEC 1.030 07/13/2015 0841   LABSPEC 1.019 05/29/2015 1420   PHURINE 6.0 07/13/2015 0841   PHURINE 5.5 05/29/2015 1420   GLUCOSEU Negative 07/13/2015 0841   GLUCOSEU NEGATIVE 05/29/2015 1420   HGBUR Large 07/13/2015 0841   HGBUR MODERATE* 05/29/2015 1420   BILIRUBINUR Negative 07/13/2015 [place] 05/29/2015 1420   KETONESUR Negative 07/13/2015 [place] 05/29/2015 1420   PROTEINUR 300 07/13/2015 0841   PROTEINUR 30* 05/29/2015 1420   UROBILINOGEN 0.2 07/13/2015 0841   UROBILINOGEN 1.0 05/29/2015 1420   NITRITE Negative 07/13/2015 0841   NITRITE NEGATIVE 05/29/2015 1420   LEUKOCYTESUR Negative 07/13/2015 0841   LEUKOCYTESUR NEGATIVE 05/29/2015 1420    STUDIES: Ct Chest W Contrast  08/10/2015  ADDENDUM REPORT: 08/10/2015 09:57 ADDENDUM: Per request of the primary clinical team, RECIST 1.0 measurements are as below. RECIST 1.0 Target Lesions: 1. Subpleural right lower lobe nodule measuring 5 mm (image 40 of series 4), unchanged in size, but slightly less solid in appearance than the prior study. 2. 6 mm subcarinal lymph node (image 28 of series 2),  unchanged. Non-target Lesions: 1. Minimal right-sided pleural thickening, and trace amount of right-sided pleural fluid (image 51 of series 2), unchanged compared to the prior examination. Electronically Signed   By: Vinnie Langton M.D.   On: 08/10/2015 09:57  08/10/2015  CLINICAL DATA:  65 year old female with history breast cancer diagnosed in 2005, with recurrence in 2013, status post right mastectomy.  Oral chemotherapy in progress. No current complaints. RECIST protocol. EXAM: CT CHEST, ABDOMEN, AND PELVIS WITH CONTRAST TECHNIQUE: Multidetector CT imaging of the chest, abdomen and pelvis was performed following the standard protocol during bolus administration of intravenous contrast. CONTRAST:  176m OMNIPAQUE IOHEXOL 300 MG/ML SOLN, 555mOMNIPAQUE IOHEXOL 300 MG/ML SOLN COMPARISON:  CT the chest, abdomen and pelvis 06/13/2015. FINDINGS: RECIST 1.1 Target Lesions: 1. No target lesions under RECIST 1.1 criteria. Non-target Lesions: 1. Persistent small amount of right-sided pleural thickening and trace right pleural effusion medially, unchanged. CT CHEST FINDINGS Mediastinum/Lymph Nodes: Heart size is normal. There is no significant pericardial fluid, thickening or pericardial calcification. No pathologically enlarged mediastinal or hilar lymph nodes. Esophagus is unremarkable in appearance. No axillary lymphadenopathy. Status post right axillary lymph node dissection. Lungs/Pleura: Ill-defined 5 mm ground-glass attenuation nodule in the periphery of the right lower lobe (image 40 of series 4), slightly less apparent than the prior study, presumably benign. Trace amount of chronic pleural fluid in the medial aspect of the lower right hemithorax, unchanged. No other suspicious appearing pulmonary nodules or masses. No acute consolidative airspace disease. No left pleural effusion. Musculoskeletal/Soft Tissues: Status post right modified radical mastectomy and right axillary lymph node dissection. No evidence of soft tissue mass along the right chest wall to suggest local recurrence of disease. There are no aggressive appearing lytic or blastic lesions noted in the visualized portions of the skeleton. CT ABDOMEN AND PELVIS FINDINGS Hepatobiliary: Diffuse low attenuation throughout the hepatic parenchyma, compatible with hepatic steatosis. No suspicious cystic or solid hepatic lesions. No intra or extrahepatic  biliary ductal dilatation. Gallbladder is normal in appearance. Pancreas: No pancreatic mass. No pancreatic ductal dilatation. No pancreatic or peripancreatic fluid or inflammatory changes. Spleen: Unremarkable. Adrenals/Urinary Tract: 1 cm nodule in the lateral limb of the left adrenal gland is unchanged compared to prior studies, and although incompletely characterized on today's CT examination, stability compared to numerous prior examinations strongly favors a benign adenoma. Right adrenal gland and bilateral kidneys are normal in appearance. No hydroureteronephrosis. Urinary bladder is normal in appearance. Stomach/Bowel: Normal appearance of the stomach. No pathologic dilatation of small bowel or colon. Normal appendix. Vascular/Lymphatic: Atherosclerosis throughout the abdominal and pelvic vasculature, without evidence of aneurysm or dissection. No lymphadenopathy noted in the abdomen or pelvis. Reproductive: Uterus and ovaries are atrophic. Other: No significant volume of ascites.  No pneumoperitoneum. Musculoskeletal: Status post left hip arthroplasty. Sclerotic lesions in the anterior aspect of the left ilium (image 94 of series 2) and in the left sacrum (image 96 of series 2) unchanged over numerous prior examinations, most likely to represent treated metastatic lesions. IMPRESSION: 1. Status post right modified radical mastectomy and right axillary lymph node dissection, without evidence to suggest local recurrence of disease or metastatic disease in the chest, abdomen or pelvis. 2. 5 mm nodule in the right lower lobe is slightly less apparent and more ground-glass attenuation in appearance than the prior examination, presumably benign. 3. Unchanged sclerotic lesions in the left hemipelvis, similar to numerous prior examinations, most likely to represent treated metastases. 4. Hepatic steatosis. 5. Stable 1 cm left adrenal  nodule, presumably a tiny adenoma. Electronically Signed: By: Vinnie Langton  M.D. On: 08/08/2015 14:17   Ct Abdomen Pelvis W Contrast  08/10/2015  ADDENDUM REPORT: 08/10/2015 09:57 ADDENDUM: Per request of the primary clinical team, RECIST 1.0 measurements are as below. RECIST 1.0 Target Lesions: 1. Subpleural right lower lobe nodule measuring 5 mm (image 40 of series 4), unchanged in size, but slightly less solid in appearance than the prior study. 2. 6 mm subcarinal lymph node (image 28 of series 2), unchanged. Non-target Lesions: 1. Minimal right-sided pleural thickening, and trace amount of right-sided pleural fluid (image 51 of series 2), unchanged compared to the prior examination. Electronically Signed   By: Vinnie Langton M.D.   On: 08/10/2015 09:57  08/10/2015  CLINICAL DATA:  65 year old female with history breast cancer diagnosed in 2005, with recurrence in 2013, status post right mastectomy. Oral chemotherapy in progress. No current complaints. RECIST protocol. EXAM: CT CHEST, ABDOMEN, AND PELVIS WITH CONTRAST TECHNIQUE: Multidetector CT imaging of the chest, abdomen and pelvis was performed following the standard protocol during bolus administration of intravenous contrast. CONTRAST:  14m OMNIPAQUE IOHEXOL 300 MG/ML SOLN, 568mOMNIPAQUE IOHEXOL 300 MG/ML SOLN COMPARISON:  CT the chest, abdomen and pelvis 06/13/2015. FINDINGS: RECIST 1.1 Target Lesions: 1. No target lesions under RECIST 1.1 criteria. Non-target Lesions: 1. Persistent small amount of right-sided pleural thickening and trace right pleural effusion medially, unchanged. CT CHEST FINDINGS Mediastinum/Lymph Nodes: Heart size is normal. There is no significant pericardial fluid, thickening or pericardial calcification. No pathologically enlarged mediastinal or hilar lymph nodes. Esophagus is unremarkable in appearance. No axillary lymphadenopathy. Status post right axillary lymph node dissection. Lungs/Pleura: Ill-defined 5 mm ground-glass attenuation nodule in the periphery of the right lower lobe (image 40 of  series 4), slightly less apparent than the prior study, presumably benign. Trace amount of chronic pleural fluid in the medial aspect of the lower right hemithorax, unchanged. No other suspicious appearing pulmonary nodules or masses. No acute consolidative airspace disease. No left pleural effusion. Musculoskeletal/Soft Tissues: Status post right modified radical mastectomy and right axillary lymph node dissection. No evidence of soft tissue mass along the right chest wall to suggest local recurrence of disease. There are no aggressive appearing lytic or blastic lesions noted in the visualized portions of the skeleton. CT ABDOMEN AND PELVIS FINDINGS Hepatobiliary: Diffuse low attenuation throughout the hepatic parenchyma, compatible with hepatic steatosis. No suspicious cystic or solid hepatic lesions. No intra or extrahepatic biliary ductal dilatation. Gallbladder is normal in appearance. Pancreas: No pancreatic mass. No pancreatic ductal dilatation. No pancreatic or peripancreatic fluid or inflammatory changes. Spleen: Unremarkable. Adrenals/Urinary Tract: 1 cm nodule in the lateral limb of the left adrenal gland is unchanged compared to prior studies, and although incompletely characterized on today's CT examination, stability compared to numerous prior examinations strongly favors a benign adenoma. Right adrenal gland and bilateral kidneys are normal in appearance. No hydroureteronephrosis. Urinary bladder is normal in appearance. Stomach/Bowel: Normal appearance of the stomach. No pathologic dilatation of small bowel or colon. Normal appendix. Vascular/Lymphatic: Atherosclerosis throughout the abdominal and pelvic vasculature, without evidence of aneurysm or dissection. No lymphadenopathy noted in the abdomen or pelvis. Reproductive: Uterus and ovaries are atrophic. Other: No significant volume of ascites.  No pneumoperitoneum. Musculoskeletal: Status post left hip arthroplasty. Sclerotic lesions in the  anterior aspect of the left ilium (image 94 of series 2) and in the left sacrum (image 96 of series 2) unchanged over numerous prior examinations, most likely to represent treated  metastatic lesions. IMPRESSION: 1. Status post right modified radical mastectomy and right axillary lymph node dissection, without evidence to suggest local recurrence of disease or metastatic disease in the chest, abdomen or pelvis. 2. 5 mm nodule in the right lower lobe is slightly less apparent and more ground-glass attenuation in appearance than the prior examination, presumably benign. 3. Unchanged sclerotic lesions in the left hemipelvis, similar to numerous prior examinations, most likely to represent treated metastases. 4. Hepatic steatosis. 5. Stable 1 cm left adrenal nodule, presumably a tiny adenoma. Electronically Signed: By: Vinnie Langton M.D. On: 08/08/2015 14:17   Dg Bone Density  08/14/2015  EXAM: DUAL X-RAY ABSORPTIOMETRY (DXA) FOR BONE MINERAL DENSITY IMPRESSION: Referring Physician:  Laurie Panda PATIENT: Name: Evalynn, Hankins I Patient ID: [id number] Birth Date: May 13, 1951 Height: 63.7 in. Sex: Female Measured: 08/14/2015 Weight: 166.0 lbs. Indications: Breast Cancer History, Caucasian, Estrogen Deficient, Family History of Osteoporosis, Height Loss (781.91), Left hip replaced, Omeprazole, Postmenopausal Fractures: Treatments: Vitamin D (E933.5) ASSESSMENT: The BMD measured at Femur Neck is 0.809 g/cm2 with a T-score of -1.6. This patient is considered to be osteopenic according to [facility]) criteria. Lumbar spine was not utilized due to surgical changes. The left femur was excluded due to surgical hardware. Site Region Measured Date Measured Age YA BMD Significant CHANGE T-score Right Femur Neck 08/14/2015 64.5 -1.6 0.809 g/cm2 Left Forearm Radius 33% 08/14/2015 64.5 -0.1 0.883 g/cm2 World Health Organization [facility]) criteria for post-menopausal, Caucasian Women: Normal       T-score at or  above -1 SD Osteopenia   T-score between -1 and -2.5 SD Osteoporosis T-score at or below -2.5 SD RECOMMENDATION: [place] recommends that FDA-approved medical therapies be considered in postmenopausal women and men age 83 or older with a: 1. Hip or vertebral (clinical or morphometric) fracture. 2. T-score of <-2.5 at the spine or hip. 3. Ten-year fracture probability by FRAX of 3% or greater for hip fracture or 20% or greater for major osteoporotic fracture. All treatment decisions require clinical judgment and consideration of individual patient factors, including patient preferences, co-morbidities, previous drug use, risk factors not captured in the FRAX model (e.g. falls, vitamin D deficiency, increased bone turnover, interval significant decline in bone density) and possible under - or over-estimation of fracture risk by FRAX. All patients should ensure an adequate intake of dietary calcium (1200 mg/d) and vitamin D (800 IU daily) unless contraindicated. FOLLOW-UP: People with diagnosed cases of osteoporosis or at high risk for fracture should have regular bone mineral density tests. For patients eligible for Medicare, routine testing is allowed once every 2 years. The testing frequency can be increased to one year for patients who have rapidly progressing disease, those who are receiving or discontinuing medical therapy to restore bone mass, or have additional risk factors. FRAX* 10-year Probability of Fracture Based on femoral neck BMD: Femur (Right) Major Osteoporotic Fracture: 9.1% Hip Fracture:                1.0% Population:                  [place] (Caucasian) Risk Factors:                None *FRAX is a Materials engineer of the [employer] for Metabolic Bone Disease, a World Pharmacologist (WHO) [facility]. ASSESSMENT: The probability of a major osteoporotic fracture is 9.1% within the next ten years. The probability of a hip fracture is  1.0% within the next ten years. Electronically Signed   By: Earle Gell M.D.   On: 08/14/2015 18:02      ASSESSMENT: 65 y.o. Barbara Parks, [place] woman with stage IV breast cancer, on BOLERO-4 trial  (1) status post right mastectomy and sentinel lymph node sampling 03/22/2004 for a pT2 pN1, stage IIB invasive ductal carcinoma with lobular features, grade 2, estrogen receptor and progesterone receptor positive, HER-2 negative, with an MIB-1 of 9% ([id number] and [id number])  (2) addition all right axillary lymph node sampling 05/07/2004 showed 2 benign lymph nodes (4 lymph nodes previously removed, so total was one positive lymph node out of 6; [id number])  (3) the patient was evaluated by radiation oncology; no postmastectomy radiation recommended  (4) adjuvant chemotherapy with dose dense doxorubicin and cyclophosphamide x4 cycles (first cycle delayed one week) followed by dose dense paclitaxel x4 was completed 09/18/2004  (5) tamoxifen started March 2006, discontinued 2009  METASTATIC DISEASE: (6) presenting with a large pericardial effusion, large right pleural effusion and possible right middle lobe bronchial obstruction February 2013, status post pericardial window placement, fiberoptic bronchoscopy and right Pleurx placement 09/11/2011, with biopsy of the bronchus intermedius and pericardium positive for metastatic breast cancer, estrogen receptor 91% positive with moderate staining intensity, progesterone receptor 100% positive with strong staining intensity, with an MIB-1 of 35%, and no HER-2 amplification, the signals ratio being 1.37 ([id number])  (7) enrolled in BOLERO-4 trial 10/10/2011, receiving letrozole and everolimus  (a) two small areas of enhancement in the cerebellum noted by brain MRI 10/03/2011 were no longer apparent on repeat MRI 08/21/2012-- most recent brain MRI 04/01/2014 showed no evidence of intracranial metastatic disease  (b) sclerotic lesions in left iliac bone and sacrum have  not been biopsied; stable; plan is to start zolendronate after patient updates her dental care (extraction planned)  (c) RLL lung nodule, stable (rescanned 07/12/2014 and 08/11/2014) R hilar and subcarinal lymph nodes: stable  (9) additional problems:  (a) hepatic steatosis  (b) COPD/ emphysema/ asthma  (c) advanced L hip osteoarthritis, status post left total hip replacement 06/07/2015  (d) aortoiliac atherosclerosis  (e) dental evaluation pending w possible dental extractions  (f) possible thalassemia trait  (10) Bone density concerns: DEXA scan 08/11/2013 was normal   PLAN: Roxie continues to tolerate the letrozole and everolimus without any significant side effects. We are continuing with the current cycle and she is already scheduled for her 53d cycle visit.  Of course we are concerned that she is having to have so many CT scans. The study is likely to close soon and we can start a more reasonable follow-up process.  I am concerned about her left knee. I think if she does not let it rest a little bit and recuperate she is can have long-term problems. We discussed I Scott, Aleve, and a little bit of rest for about a week. She was also offered participation in our rehabilitation program here but she declines at this point.  We also discussed hot flashes. This certainly could be related to the letrozole. For daytime hot flashes we use the TTS 1 patch and venlafaxine. For nighttime hot flashes we used gabapentin. We discussed those in detail today. At this point she just will wants to "live with it" .Marland Kitchen She knows this is available if she wishes.  Otherwise we are continuing follow-up as before-- she knows to call for any problems that may develop before the next visit here  Chauncey Cruel, MD   09/07/2015 9:31 AM

## 2015-09-07 NOTE — Progress Notes (Signed)
09/07/15 at 10:15am- [employer]/ Bolero 4 cycle 52, day 1 study notes- The pt was into the cancer center this am for her fasting labs and cycle 52, day 1assessments. The pt is 13 weeks post-op from her left hip replacement surgery. The pt is walking 2.5 miles a days.  The pt reports that she is feeling fine, but her left knee has caused her some discomfort lately.  She said that her tendonitis has resolved.  The pt said that her orthopedic doctor said that her knee pain is related to arthritis.  Dr. Jana Hakim felt that the pt could benefit from physical therapy.  The pt did not want to pursue any physical therapy now.  The pt said that she wanted "to wait it out" and hope that it improves.  The pt confirmed that she was fasting upon arrival to the cancer center for her labs. The pt was seen and examined today by Dr. Jana Hakim. He reviewed the pt's labs, and he felt that her abnormal lab values were "not clinically significant". The pt's vitals were stable, and her mean BP was obtained for study purposes The pt's weight is down 4 lbs from her last visit (weight loss ongoing). The pt's recent bone density scan on 08/14/15 revealed grade 1 osteopenia.  She reports the following AE's as ongoing: hot flashes, memory impairment, fatigue, and back pain.  The pt denies bilateral knee pain. The left knee pain will be reported as grade 2 AE since it is affecting her gait.  The pt also mentioned 2 new, mild AE's: nail changes and dry skin.  The pt feels that they are related to her letrozole, not her everolimus.  She reports the following concomitant medications as ongoing: Lopressor, compazine prn, immodium prn, lopid, vitamin D, Tylenol arthritis prn, and ibuprofen prn. She said that she has restarted her Prilosec for prophylaxis.  She is performing all of her usual activities with some limitations. ECOG=1.  The pt returned her cycle 51 everolimus study drug boxes. She confirmed that she took her everolimus (10 mg) every  day along with letrozole (2.5 mg) from 08/10/15 - 09/06/15 (28 days). The pt verified that she did take her study drugs on 08/24/15.  The pt's everolimus drug boxes had 0 remaining (unopened) blister packs. The pt was dispensed 56 pills - 56 taken =0 pills. Therefore, the pt is 100% compliant with her study drug, everolimus, for cycle 51. The pt's everolimus was taken to the pharmacy for the drug accountability check and storage. The pt was dispensed her cycle 52 everolimus study drug boxes for self administration. The pt is aware of her March appointments.The pt's lipid panel is pending. Brion Aliment RN, BSN, CCRP Clinical Research Nurse 09/07/2015 12:00 PM   09/08/15 at 8:36am - The pt's lipid panel is relatively stable. Will notify pt that her cholesterol and triglycerides have increased and advise pt to modify her diet.   Brion Aliment RN, BSN, CCRP Clinical Research Nurse 09/08/2015 8:36 AM

## 2015-09-20 ENCOUNTER — Emergency Department (HOSPITAL_COMMUNITY): Payer: Medicare Other

## 2015-09-20 ENCOUNTER — Encounter (HOSPITAL_COMMUNITY): Payer: Self-pay | Admitting: Emergency Medicine

## 2015-09-20 ENCOUNTER — Emergency Department (HOSPITAL_COMMUNITY)
Admission: EM | Admit: 2015-09-20 | Discharge: 2015-09-20 | Disposition: A | Discharge status: Home / Self Care | Payer: Medicare Other | Attending: Emergency Medicine | Admitting: Emergency Medicine

## 2015-09-20 ENCOUNTER — Other Ambulatory Visit: Payer: Self-pay | Admitting: Oncology

## 2015-09-20 DIAGNOSIS — R0602 Shortness of breath: Secondary | ICD-10-CM | POA: Diagnosis not present

## 2015-09-20 DIAGNOSIS — R0600 Dyspnea, unspecified: Secondary | ICD-10-CM | POA: Insufficient documentation

## 2015-09-20 DIAGNOSIS — F17210 Nicotine dependence, cigarettes, uncomplicated: Secondary | ICD-10-CM | POA: Insufficient documentation

## 2015-09-20 DIAGNOSIS — Z853 Personal history of malignant neoplasm of breast: Secondary | ICD-10-CM | POA: Diagnosis not present

## 2015-09-20 DIAGNOSIS — Z79899 Other long term (current) drug therapy: Secondary | ICD-10-CM | POA: Insufficient documentation

## 2015-09-20 DIAGNOSIS — E7800 Pure hypercholesterolemia, unspecified: Secondary | ICD-10-CM | POA: Insufficient documentation

## 2015-09-20 DIAGNOSIS — R002 Palpitations: Secondary | ICD-10-CM | POA: Insufficient documentation

## 2015-09-20 DIAGNOSIS — Z87891 Personal history of nicotine dependence: Secondary | ICD-10-CM | POA: Diagnosis not present

## 2015-09-20 DIAGNOSIS — Z85118 Personal history of other malignant neoplasm of bronchus and lung: Secondary | ICD-10-CM | POA: Diagnosis not present

## 2015-09-20 HISTORY — DX: Palpitations: R00.2

## 2015-09-20 HISTORY — DX: Unspecified asthma, uncomplicated: J45.909

## 2015-09-20 LAB — BASIC METABOLIC PANEL
Anion gap: 8 (ref 5–15)
BUN: 20 mg/dL (ref 6–20)
CALCIUM: 9.3 mg/dL (ref 8.9–10.3)
CO2: 23 mmol/L (ref 22–32)
Chloride: 106 mmol/L (ref 101–111)
Creatinine, Ser: 0.8 mg/dL (ref 0.44–1.00)
GFR calc Af Amer: >60 mL/min (ref >60)
GLUCOSE: 265 mg/dL — AB (ref 65–99)
POTASSIUM: 4.3 mmol/L (ref 3.5–5.1)
SODIUM: 137 mmol/L (ref 135–145)

## 2015-09-20 LAB — URINALYSIS, ROUTINE W REFLEX MICROSCOPIC
BILIRUBIN URINE: NEGATIVE
GLUCOSE, UA: 500 mg/dL — AB
KETONES UR: NEGATIVE mg/dL
LEUKOCYTES UA: NEGATIVE
Nitrite: NEGATIVE
PH: 5.5 (ref 5.0–8.0)
PROTEIN: 30 mg/dL — AB
Specific Gravity, Urine: >1.03 — ABNORMAL HIGH (ref 1.005–1.030)

## 2015-09-20 LAB — URINE MICROSCOPIC-ADD ON

## 2015-09-20 LAB — TROPONIN I: Troponin I: <0.03 ng/mL (ref <0.031)

## 2015-09-20 LAB — CBC
HCT: 42.2 % (ref 36.0–46.0)
Hemoglobin: 13.9 g/dL (ref 12.0–15.0)
MCH: 25.6 pg — AB (ref 26.0–34.0)
MCHC: 32.9 g/dL (ref 30.0–36.0)
MCV: 77.7 fL — AB (ref 78.0–100.0)
PLATELETS: 301 10*3/uL (ref 150–400)
RBC: 5.43 MIL/uL — AB (ref 3.87–5.11)
RDW: 15.5 % (ref 11.5–15.5)
WBC: 8.4 10*3/uL (ref 4.0–10.5)

## 2015-09-20 MED ORDER — DOXYCYCLINE HYCLATE 100 MG PO TABS: 100.0000 mg | ORAL_TABLET | Freq: Two times a day (BID) | ORAL | Status: DC

## 2015-09-20 MED ORDER — SODIUM CHLORIDE 0.9 % IV SOLN
INTRAVENOUS | Status: DC
  Administered 2015-09-20: 14:00:00 via INTRAVENOUS

## 2015-09-20 MED ORDER — IOHEXOL 350 MG/ML SOLN
100.0000 mL | Freq: Once | INTRAVENOUS | Status: AC | PRN
  Administered 2015-09-20: 100 mL via INTRAVENOUS

## 2015-09-20 NOTE — ED Notes (Signed)
Pt ambulated with no difficulty and kept 02 sats at 100%.

## 2015-09-20 NOTE — ED Notes (Signed)
Patient with no complaints at this time. Respirations even and unlabored. Skin warm/dry. Discharge instructions reviewed with patient at this time. Patient given opportunity to voice concerns/ask questions. IV removed per policy and band-aid applied to site. Patient discharged at this time and left Emergency Department with steady gait.  

## 2015-09-20 NOTE — ED Provider Notes (Signed)
CSN: [id number]     Arrival date & time 09/20/15  1150 History   First MD Initiated Contact with Patient 09/20/15 1339     Chief Complaint  Patient presents with  . Palpitations  . Shortness of Breath  . Dizziness    lightheadedness      HPI  Pt was seen at 1345. Per pt, c/o gradual onset and persistence of intermittent symptoms for the past 5 days. Pt states her symptoms include: palpitations, SOB, generalized weakness and lightheadedness. Endorses hx of palpitations, which do not concern her (states HR at home during palpitation episodes is "100"). Pt is concerned regarding her other symptoms, as they do not usually accompany her palpitations. Pt states she has hx of breast CA with lung mets, and previously "needed fluid drained off my lung" when she had these other symptoms. Pt states she is "currently in a study at [facility] for my cancer" and takes daily oral chemo. Denies any change in her usual palpitations, no cough, no back pain, no abd pain, no vomiting/diarrhea, no fevers, no rash, no calf/LE pain or unilateral swelling.     Past Medical History  Diagnosis Date  . Shortness of breath   . Arthritis     left hip  . Osteopenia due to cancer therapy 09/09/2013  . Hypercholesteremia   . breast ca 2005    breast/chemo R mastectomy  . Metastasis to lung ([place]) dx'd 08/2011  . GERD (gastroesophageal reflux disease)   . Palpitations    Past Surgical History  Procedure Laterality Date  . Spine surgery  1996  . Breast surgery  2005    right  . Video bronchoscopy  09/11/2011    Procedure: VIDEO BRONCHOSCOPY;  Surgeon: Gaye Pollack, MD;  Location: [facility];  Service: Thoracic;  Laterality: N/A;  . Chest tube insertion  09/11/2011    Procedure: INSERTION PLEURAL DRAINAGE CATHETER;  Surgeon: Gaye Pollack, MD;  Location: [place];  Service: Thoracic;  Laterality: Right;  . Pericardial window  09/11/2011    Procedure: PERICARDIAL WINDOW;  Surgeon: Gaye Pollack, MD;  Location: [place];  Service:  Thoracic;  Laterality: N/A;  . Removal of pleural drainage catheter  12/19/2011    Procedure: REMOVAL OF PLEURAL DRAINAGE CATHETER;  Surgeon: Gaye Pollack, MD;  Location: [place];  Service: Thoracic;  Laterality: Right;  TO BE DONE IN MINOR ROOM, SHORT STAY  . Cataract extraction w/phaco Left 01/18/2014    Procedure: CATARACT EXTRACTION PHACO AND INTRAOCULAR LENS PLACEMENT (IOC);  Surgeon: Elta Guadeloupe T. Gershon Crane, MD;  Location: [facility];  Service: Ophthalmology;  Laterality: Left;  CDE 15.79  . Cataract extraction w/phaco Right 02/08/2014    Procedure: CATARACT EXTRACTION PHACO AND INTRAOCULAR LENS PLACEMENT (IOC);  Surgeon: Elta Guadeloupe T. Gershon Crane, MD;  Location: [facility];  Service: Ophthalmology;  Laterality: Right;  CDE 4.41  . Total hip arthroplasty Left 06/07/2015    Procedure: LEFT TOTAL HIP ARTHROPLASTY ANTERIOR APPROACH;  Surgeon: Gaynelle Arabian, MD;  Location: [facility];  Service: Orthopedics;  Laterality: Left;   Family History  Problem Relation Age of Onset  . Cancer Mother     breast  . Heart disease Father   . Anesthesia problems Neg Hx    Social History  Substance Use Topics  . Smoking status: Former Smoker -- 1.50 packs/day for 30 years    Types: Cigarettes    Quit date: 09/10/2007  . Smokeless tobacco: None  . Alcohol Use: No    Review of Systems  ROS: Statement: All systems negative except as marked or noted in the HPI; Constitutional: Negative for fever and chills. ; ; Eyes: Negative for eye pain, redness and discharge. ; ; ENMT: Negative for ear pain, hoarseness, nasal congestion, sinus pressure and sore throat. ; ; Cardiovascular: +paliptations, SOB. Negative for chest pain, diaphoresis, and peripheral edema. ; ; Respiratory: Negative for cough, wheezing and stridor. ; ; Gastrointestinal: Negative for nausea, vomiting, diarrhea, abdominal pain, blood in stool, hematemesis, jaundice and rectal bleeding. . ; ; Genitourinary: Negative for dysuria, flank pain and hematuria. ; ; Musculoskeletal:  Negative for back pain and neck pain. Negative for swelling and trauma.; ; Skin: Negative for pruritus, rash, abrasions, blisters, bruising and skin lesion.; ; Neuro: +generalized weakness, lightheadedness. Negative for headache and neck stiffness. Negative for altered level of consciousness , altered mental status, extremity weakness, paresthesias, involuntary movement, seizure and syncope.      Allergies  Aspirin; Latex; and Nsaids  Home Medications   Prior to Admission medications   Medication Sig Start Date End Date Taking? Authorizing Provider  cholecalciferol (VITAMIN D) 1000 units tablet Take 1,000 Units by mouth 2 (two) times daily.   Yes Historical Provider, MD  gemfibrozil (LOPID) 600 MG tablet TAKE ONE TABLET BY MOUTH TWICE DAILY BEFORE A MEAL 08/23/15  Yes Chauncey Cruel, MD  Investigational everolimus (RAD001) 5 MG tablet [employer] [id number] Take 2 tablets by mouth daily. Take with a glass of water. 09/07/15  Yes Chauncey Cruel, MD  letrozole [facility]) 2.5 MG tablet Take 1 tablet (2.5 mg total) by mouth daily. 09/07/15  Yes Chauncey Cruel, MD  metoprolol tartrate (LOPRESSOR) 25 MG tablet Take 0.5 tablets (12.5 mg total) by mouth daily. 05/18/15  Yes Laurie Panda, NP  omeprazole (PRILOSEC) 40 MG capsule Take 1 capsule (40 mg total) by mouth daily as needed. 01/26/15  Yes Chauncey Cruel, MD  prochlorperazine (COMPAZINE) 10 MG tablet Take 1 tablet (10 mg total) by mouth every 6 (six) hours as needed (Nausea or vomiting). 09/07/15  Yes Chauncey Cruel, MD  loperamide (IMODIUM) 2 MG capsule Take 2 mg by mouth 4 (four) times daily as needed for diarrhea or loose stools. Reported on 07/13/2015    Historical Provider, MD  LORazepam (ATIVAN) 0.5 MG tablet Take 1 tablet (0.5 mg total) by mouth every 6 (six) hours as needed (Nausea or vomiting). Patient not taking: Reported on 09/20/2015 09/07/15   Chauncey Cruel, MD  ondansetron (ZOFRAN) 8 MG tablet Take 1 tablet (8 mg total) by  mouth 2 (two) times daily as needed (Nausea or vomiting). Patient not taking: Reported on 09/20/2015 09/07/15   Chauncey Cruel, MD  prochlorperazine (COMPAZINE) 25 MG suppository Place 1 suppository (25 mg total) rectally every 12 (twelve) hours as needed for nausea. Patient not taking: Reported on 09/20/2015 09/07/15   Chauncey Cruel, MD   BP 142/81 mmHg  Pulse 85  Temp(Src) 98.9 F (37.2 C) (Oral)  Resp 18  Ht 5\' 4"  (1.626 m)  Wt 159 lb (72.122 kg)  BMI 27.28 kg/m2  SpO2 100%   14:23:04 Orthostatic Vital Signs MM  Orthostatic Lying  - BP- Lying: 133/67 mmHg ; Pulse- Lying: 91  Orthostatic Sitting - BP- Sitting: 150/73 mmHg ; Pulse- Sitting: 89  Orthostatic Standing at 0 minutes - BP- Standing at 0 minutes: 136/70 mmHg ; Pulse- Standing at 0 minutes: 88      Physical Exam 1350: Physical examination:  Nursing notes reviewed; Vital signs and  O2 SAT reviewed;  Constitutional: Well developed, Well nourished, Well hydrated, In no acute distress; Head:  Normocephalic, atraumatic; Eyes: EOMI, PERRL, No scleral icterus; ENMT: Mouth and pharynx normal, Mucous membranes moist; Neck: Supple, Full range of motion, No lymphadenopathy; Cardiovascular: Regular rate and rhythm, No gallop; Respiratory: Breath sounds clear & equal bilaterally, No wheezes.  Speaking full sentences with ease, Normal respiratory effort/excursion; Chest: Nontender, Movement normal; Abdomen: Soft, Nontender, Nondistended, Normal bowel sounds; Genitourinary: No CVA tenderness; Extremities: Pulses normal, No tenderness, No edema, No calf edema or asymmetry.; Neuro: AA&Ox3, Major CN grossly intact.  Speech clear. No gross focal motor or sensory deficits in extremities. Climbs on and off stretcher easily by herself. Gait steady.; Skin: Color normal, Warm, Dry.   ED Course  Procedures (including critical care time) Labs Review  Imaging Review  I have personally reviewed and evaluated these images and lab results as part of my  medical decision-making.   EKG Interpretation   Date/Time:  Wednesday September 20 2015 12:03:40 EST Ventricular Rate:  82 PR Interval:  112 QRS Duration: 78 QT Interval:  370 QTC Calculation: 432 R Axis:   44 Text Interpretation:  Normal sinus rhythm Normal ECG When compared with  ECG of 01/14/2014 No significant change was found Confirmed by [facility]  MD,  Nunzio Cory [phone]) on 09/20/2015 2:00:21 PM      MDM  MDM Reviewed: previous chart, nursing note and vitals Reviewed previous: labs and ECG Interpretation: labs, ECG, x-ray and CT scan      Results for orders placed or performed during the hospital encounter of 09/20/15  CBC  Result Value Ref Range   WBC 8.4 4.0 - 10.5 K/uL   RBC 5.43 (H) 3.87 - 5.11 MIL/uL   Hemoglobin 13.9 12.0 - 15.0 g/dL   HCT 42.2 36.0 - 46.0 %   MCV 77.7 (L) 78.0 - 100.0 fL   MCH 25.6 (L) 26.0 - 34.0 pg   MCHC 32.9 30.0 - 36.0 g/dL   RDW 15.5 11.5 - 15.5 %   Platelets 301 150 - 400 K/uL  Basic metabolic panel  Result Value Ref Range   Sodium 137 135 - 145 mmol/L   Potassium 4.3 3.5 - 5.1 mmol/L   Chloride 106 101 - 111 mmol/L   CO2 23 22 - 32 mmol/L   Glucose, Bld 265 (H) 65 - 99 mg/dL   BUN 20 6 - 20 mg/dL   Creatinine, Ser 0.80 0.44 - 1.00 mg/dL   Calcium 9.3 8.9 - 10.3 mg/dL   GFR calc non Af Amer >60 >60 mL/min   GFR calc Af Amer >60 >60 mL/min   Anion gap 8 5 - 15  Troponin I  Result Value Ref Range   Troponin I <0.03 <0.031 ng/mL  Urinalysis, Routine w reflex microscopic  Result Value Ref Range   Color, Urine YELLOW YELLOW   APPearance HAZY (A) CLEAR   Specific Gravity, Urine >1.030 (H) 1.005 - 1.030   pH 5.5 5.0 - 8.0   Glucose, UA 500 (A) NEGATIVE mg/dL   Hgb urine dipstick MODERATE (A) NEGATIVE   Bilirubin Urine NEGATIVE NEGATIVE   Ketones, ur NEGATIVE NEGATIVE mg/dL   Protein, ur 30 (A) NEGATIVE mg/dL   Nitrite NEGATIVE NEGATIVE   Leukocytes, UA NEGATIVE NEGATIVE  Urine microscopic-add on  Result Value Ref Range    Squamous Epithelial / LPF 0-5 (A) NONE SEEN   WBC, UA 0-5 0 - 5 WBC/hpf   RBC / HPF 0-5 0 - 5 RBC/hpf  Bacteria, UA FEW (A) NONE SEEN   Casts HYALINE CASTS (A) NEGATIVE   Dg Chest 2 View 09/20/2015  CLINICAL DATA:  Chest pain and shortness of breath. EXAM: CHEST  2 VIEW COMPARISON:  Chest CT 08/08/2015 FINDINGS: Normal heart size and mediastinal contours. No acute infiltrate or edema. No effusion or pneumothorax. Right axillary dissection and mastectomy. No acute osseous findings. IMPRESSION: No acute finding. Electronically Signed   By: Monte Fantasia M.D.   On: 09/20/2015 12:30   Ct Angio Chest Pe W/cm &/or Wo Cm 09/20/2015  CLINICAL DATA:  65 year old female with palpitations for 5 days. Shortness of breath for 3 days. Breast cancer taking oral chemotherapy. Subsequent encounter. EXAM: CT ANGIOGRAPHY CHEST WITH CONTRAST TECHNIQUE: Multidetector CT imaging of the chest was performed using the standard protocol during bolus administration of intravenous contrast. Multiplanar CT image reconstructions and MIPs were obtained to evaluate the vascular anatomy. CONTRAST:  175mL OMNIPAQUE IOHEXOL 350 MG/ML SOLN COMPARISON:  Chest radiographs 1205 hours today. Restaging CTs 08/08/2015 FINDINGS: Good contrast bolus timing in the pulmonary arterial tree. No focal filling defect identified in the pulmonary arterial tree to suggest the presence of acute pulmonary embolism. Lower lung volumes with bilateral crowding which may explain new bilateral pulmonary ground-glass opacity. Major airways are patent aside from atelectatic changes. Trace right pleural effusion or pleural thickening is unchanged. Stable 5 mm subpleural right lower lobe nodule (series 5, image 50). Stable mild scarring or atelectasis in the left costophrenic angle. No pericardial effusion. Negative thoracic aorta aside from mild atherosclerosis. Sequelae of right mastectomy. No axillary or internal mammary lymphadenopathy. No mediastinal or hilar  lymphadenopathy. Mild cardiomegaly. Hepatic steatosis Re demonstrated. Stable visualized liver, spleen, pancreas, adrenal glands, and bowel in the upper abdomen. Stable sclerotic appearance of the posterior left eleventh and right twelfth ribs. No acute or other suspicious osseous lesion identified in the chest. Review of the MIP images confirms the above findings. IMPRESSION: 1.  No evidence of acute pulmonary embolus. 2. Lower lung volumes with new nonspecific pulmonary ground-glass opacity. This might simply reflect atelectasis but consider also atypical infection, less likely developing edema. 3. Otherwise stable CT appearance of the chest since January. Electronically Signed   By: Genevie Ann M.D.   On: 09/20/2015 14:59    1600:  Pt's monitor remains NSR, rate 80-90's. Pt is not orthostatic on VS. Has ambulated without distress, resps easy, Sats 100% R/A. Pt wants to go home. T/C to Onc Dr. Jana Hakim, case discussed, including:  HPI, pertinent PM/SHx, VS/PE, dx testing, ED course and treatment: he knows pt well, requests to tx abx for CT finding of possible atypical infection (zithromax or doxycycline), call ofc to f/u before her next scheduled appt 10/05/15. Dx and testing, as well as d/w Onc MD, d/w pt.  Questions answered.  Verb understanding, agreeable to d/c home with outpt f/u.   Francine Graven, DO 09/23/15 2038

## 2015-09-20 NOTE — Discharge Instructions (Signed)
°Emergency Department Resource Guide °1) Find a Doctor and Pay Out of Pocket °Although you won't have to find out who is covered by your insurance plan, it is a good idea to ask around and get recommendations. You will then need to call the office and see if the doctor you have chosen will accept you as a new patient and what types of options they offer for patients who are self-pay. Some doctors offer discounts or will set up payment plans for their patients who do not have insurance, but you will need to ask so you aren't surprised when you get to your appointment. ° °2) Contact Your Local Health Department °Not all health departments have doctors that can see patients for sick visits, but many do, so it is worth a call to see if yours does. If you don't know where your local health department is, you can check in your phone book. The CDC also has a tool to help you locate your state's health department, and many state websites also have listings of all of their local health departments. ° °3) Find a Walk-in Clinic °If your illness is not likely to be very severe or complicated, you may want to try a walk in clinic. These are popping up all over the country in pharmacies, drugstores, and shopping centers. They're usually staffed by nurse practitioners or physician assistants that have been trained to treat common illnesses and complaints. They're usually fairly quick and inexpensive. However, if you have serious medical issues or chronic medical problems, these are probably not your best option. ° °No Primary Care Doctor: °- Call [facility] at  832-8000 - they can help you locate a primary care doctor that  accepts your insurance, provides certain services, etc. °- Physician Referral Service- 1-800-533-3463 ° °Chronic Pain Problems: °Organization         Address  Phone   Notes  °[facility]  (336) 297-2271 Patients need to be referred by their primary care doctor.  ° °Medication  Assistance: °Organization         Address  Phone   Notes  °[place] [facility] [address] °[place], [place] [address] (336) 641-8030 --Must be a resident of [place] °-- Must have NO insurance coverage whatsoever (no Medicaid/ Medicare, etc.) °-- The pt. MUST have a primary care doctor that directs their care regularly and follows them in the community °  °[facility]  (866) 331-1348   °[employer]  (888) 892-1162   ° °Agencies that provide inexpensive medical care: °Organization         Address  Phone   Notes  °[facility]  (336) 832-8035   °[facility]    (336) 832-7272   °[facility] [address] °[place], [place] [address] (336) 832-4777   °[facility] [address], °[place] (336) 271-4999   °[facility]    (336) 373-0678   °[facility]    (336) 272-1050   °[facility] ° [address], [place] Phone:  (336) 832-4444, Fax:  (336) 832-4440 Hours of Operation:  9 am - 6 pm, M-F.  Also accepts Medicaid/Medicare and self-pay.  °[facility] ° [address], [place] Phone: (336) [phone], Fax: (336) [phone]. Hours of Operation:  8:30 am - 5:30 pm, M-F.  Also accepts Medicaid and self-pay.  °[facility] [place] [address], [place] Phone: (336) 878-6027   °[facility] [address], [place], [place] (336)723-1848, Ext. 123 Mondays & Thursdays: 7-9 AM.  First 15 patients are seen on a first come, first serve basis. °  ° °Medicaid-accepting [place] Providers: ° °Organization         Address  Phone   Notes  °[facility] [address], [place] (336) 641-2100 Also accepts self-pay patients.  °[facility] [address], [place] ° (336) 856-9996   °[facility] [address], [place]  (336) 288-8857   °[facility] [address] [place] [address], [place] (336) 299-7000   °Veita Bland [address], [place]  ° (336) 373-1557 Only accepts [place] Access Medicaid patients after they have their name applied to their card.  ° °Self-Pay (no insurance) in [place]: ° °Organization         Address  Phone   Notes  °Sickle Cell Patients, [facility] [address], [place] (336) 832-1970   °[facility] [address], [place] (336) 832-4400   °[facility] ° [address], [place] (336) 992-4800   °[facility]/Dr. Osei-Bonsu ° [address] [place] [address], [place] or [address], [place] (336) [phone] Phone number for both [place] and [place] locations is the same.  °[facility] [address], [place] (336) 299-0000   °[facility] [address] [place] [address], [place] or [address] (336) 852-7530 °(336) 878-2260   °[facility] [address], [place] (336) 350-1642, phone; (336) 294-5005, fax Sees patients 1st and 3rd Saturday of every month.  Must not qualify for public or private insurance (i.e. Medicaid, Medicare, [place] Health Choice, Veterans' Benefits) • Household income should be no more than 200% of the poverty level •The clinic cannot treat you if you are pregnant or think you are pregnant • Sexually transmitted diseases are not treated at the clinic.  ° ° °Dental Care: °Organization         Address  Phone  Notes  °[place] [facility] [address], [place] (336) 641-6152 Accepts children up to age 21 who are enrolled in Medicaid or [place] Health Choice; pregnant women with a Medicaid card; and children who have applied for Medicaid or [place] Health Choice, but were declined, whose parents can pay a reduced fee at time of service.  °[place]  [facility] [place]  [address], [place] (336) 641-7733 Accepts children up to age 21 who are enrolled in Medicaid or [place] Health Choice; pregnant women with a Medicaid card; and children who have applied for Medicaid or [place] Health Choice, but were declined, whose parents can pay a reduced fee at time of service.  °[facility] ° [address], [place] (336) 641-4533 Patients are seen by appointment only. Walk-ins are not accepted. [facility] will see patients 18 years of age and older. °Monday - Tuesday (8am-5pm) °Most Wednesdays (8:30-5pm) °$30 per visit, cash only  °[facility] ° [address], [place] (336) 641-4533 Patients are seen by appointment only. Walk-ins are not accepted. [facility] will see patients 18 years of age and older. °One   Wednesday Evening (Monthly: Volunteer Based).  $30 per visit, cash only  °[facility]  (919) [phone] for adults; Children under age 4, call Graduate Pediatric Dentistry at ([phone]) [phone]. Children aged 4-14, please call ([phone]) [phone] to request a pediatric application. ° Dental services are provided in all areas of dental care including fillings, crowns and bridges, complete and partial dentures, implants, gum treatment, root canals, and extractions. Preventive care is also provided. Treatment is provided to both adults and children. °Patients are selected via a lottery and there is often a waiting list. °  °[facility] [address], °[place] ° (336) 763-8833 [email or web address] °  °[facility] [address], [place], [place] (336)723-1848, Ext. 123 Second and Fourth Thursday of each month, opens at 6:30 AM; Clinic ends at 9 AM.  Patients are seen on a first-come first-served basis, and a limited number are seen during each clinic.  ° °[facility] ° [address], [place], [place] (336) 723-7904    Eligibility Requirements °You must have lived in [place], [place], or [place] counties for at least the last three months. °  You cannot be eligible for state or federal sponsored healthcare insurance, including Veterans Administration, Medicaid, or Medicare. °  You generally cannot be eligible for healthcare insurance through your employer.  °  How to apply: °Eligibility screenings are held every Tuesday and Wednesday afternoon from 1:00 pm until 4:00 pm. You do not need an appointment for the interview!  °[facility] [address], [place], [place] 336-631-2330   °[facility]  336-342-8273   °[facility]  336-703-3100   °[facility]  336-570-6415   ° °Behavioral Health Resources in the Community: °Intensive Outpatient Programs °Organization         Address  Phone  Notes  °[facility] [address], [place], [place] 336-878-6098   °[facility] [address], [place], [place] 336-832-9800   °[facility] [address], [place], [place] ° 336-882-2125   °[facility] [address],  °[place], [place] 1-800-853-5163 or 336-641-4981   °Substance Abuse Resources °Organization         Address  Phone  Notes  °[facility]  336-882-2125   °[facility]  336-784-9470   °[facility]  336-285-9073   °[facility]  336-845-3988   °Residential & Outpatient Substance Abuse Program  1-800-659-3381   °Psychological Services °Organization         Address  Phone  Notes  °[facility]  336- 832-9600   °[facility]  336- 378-7881   °[facility] [address], [place] 1-800-853-5163 or 336-641-4981   ° °Mobile Crisis Teams °Organization         Address  Phone  Notes  °[facility], Mobile Crisis Care Unit  1-877-626-1772   °Assertive °[facility] ° [address]  [place], [place] 336-834-9664   °Sharon DeEsch [address] °[place] 336-554-5454   ° °Self-Help/Support Groups °Organization         Address  Phone             Notes  °[facility] - variety of support groups  336- 373-1402 Call for more information  °Narcotics Anonymous (NA), [facility] [address], °[place]   2 meetings at this location  ° °  Residential Treatment Programs Organization         Address  Phone  Notes  [facility] [address],    [place]  1-(458)058-2117   [facility]  [address] [place] [id number], [place], [place]   [place], [place] 214-689-6465 Admissions: 8am-3pm M-F  [facility] [place] [address],    [place], [place] [id number]   [facility] [address] [place], [place], [place]   [facility] [address],  [employer], [place]   [facility] - Intensive Outpatient [address], [place], [place]   [facility] [place]  [place], [place] 1-250-103-1759 or (365)664-2168   [facility] ([facility] [address] [place], [place] Accepts Medicaid  [facility] [address],  [place] [phone] Substance Abuse/Addiction Treatment   [facility]         Address  Phone  Notes  [facility]  5750602845   Domenic Schwab, PhD [address] [place], [place]   (657) 827-5942 or 814-797-9607   [facility] [place], [place] [phone]   [facility] [address], [place], [place] 629-493-1393 Insurance/Medicaid/sponsorship through [facility] [address]                                    [place], [place] 210-492-0330 [facility] [address], [place] [phone]    Dr. Adele Schilder  787-784-5021   [facility] Dept. 1) [address], [place] 2) [place] 3)  [address], [place] [phone] [phone]  [phone]   [place] (412)470-0102 or 305-545-0124 (After Hours)      Avoid avoid caffinated products, such as teas, colas, coffee, chocolate. Avoid over the counter cold medicines, herbal or "natural vitamin" products, and illicit drugs because they can contain stimulants. Take the prescription as directed. Call your regular Oncologist tomorrow to schedule a follow up appointment in the next 2 to 3 days.  Return to the Emergency Department immediately if worsening.

## 2015-09-20 NOTE — ED Notes (Signed)
Pt c/o heart palpitations since Friday night with weakness/lightheadedness/sob x 3 days. Pt has stage 4 breast cancer and is currently taking oral chemotherapy medication.

## 2015-09-21 ENCOUNTER — Other Ambulatory Visit: Payer: Self-pay | Admitting: Oncology

## 2015-09-25 ENCOUNTER — Other Ambulatory Visit: Payer: Self-pay | Admitting: Oncology

## 2015-10-03 ENCOUNTER — Ambulatory Visit (HOSPITAL_COMMUNITY)
Admission: RE | Admit: 2015-10-03 | Discharge: 2015-10-03 | Disposition: A | Discharge status: Home / Self Care | Payer: Medicare Other | Source: Ambulatory Visit | Attending: Oncology | Admitting: Oncology

## 2015-10-03 ENCOUNTER — Encounter (HOSPITAL_COMMUNITY): Payer: Self-pay

## 2015-10-03 DIAGNOSIS — C7800 Secondary malignant neoplasm of unspecified lung: Secondary | ICD-10-CM

## 2015-10-03 DIAGNOSIS — C50911 Malignant neoplasm of unspecified site of right female breast: Secondary | ICD-10-CM | POA: Insufficient documentation

## 2015-10-03 DIAGNOSIS — M899 Disorder of bone, unspecified: Secondary | ICD-10-CM | POA: Diagnosis not present

## 2015-10-03 DIAGNOSIS — K760 Fatty (change of) liver, not elsewhere classified: Secondary | ICD-10-CM | POA: Diagnosis not present

## 2015-10-03 DIAGNOSIS — R911 Solitary pulmonary nodule: Secondary | ICD-10-CM | POA: Diagnosis not present

## 2015-10-03 DIAGNOSIS — R935 Abnormal findings on diagnostic imaging of other abdominal regions, including retroperitoneum: Secondary | ICD-10-CM | POA: Insufficient documentation

## 2015-10-03 MED ORDER — IOHEXOL 300 MG/ML  SOLN
100.0000 mL | Freq: Once | INTRAMUSCULAR | Status: AC | PRN
  Administered 2015-10-03: 100 mL via INTRAVENOUS

## 2015-10-03 MED ORDER — IOHEXOL 300 MG/ML  SOLN
50.0000 mL | Freq: Once | INTRAMUSCULAR | Status: AC | PRN
  Administered 2015-10-03: 50 mL via ORAL

## 2015-10-04 IMAGING — CT CT CHEST W/ CM
2 of 5 series · 15 of 46 positions shown, 17 images · IV contrast (OMNIPAQUE)
Comparison: 01/25/2014

ADDENDUM:
Target lesions:

1. Right lower lobe pulmonary nodule measures 6 mm on image number
41. It measures 6 mm on the prior study.
2. Partially calcified subcarinal lymph node measures 6 mm on image
number 28. This is unchanged.
3. Right hilar lymph node on image number 27 measures 5 mm. It
previously measured 4 mm.
CLINICAL DATA: Breast cancer.
EXAM:
CT CHEST, ABDOMEN, AND PELVIS WITH CONTRAST
TECHNIQUE: Multidetector CT imaging of the chest, abdomen and pelvis was
performed following the standard protocol during bolus
administration of intravenous contrast.
CONTRAST:  100mL OMNIPAQUE IOHEXOL 300 MG/ML  SOLN

[Series 2: cap with st · axial · 0.72mm/px · z∈[-571,-36]mm · 12 of 123 slices shown, 14 images]
[im 8/123  soft-tissue]
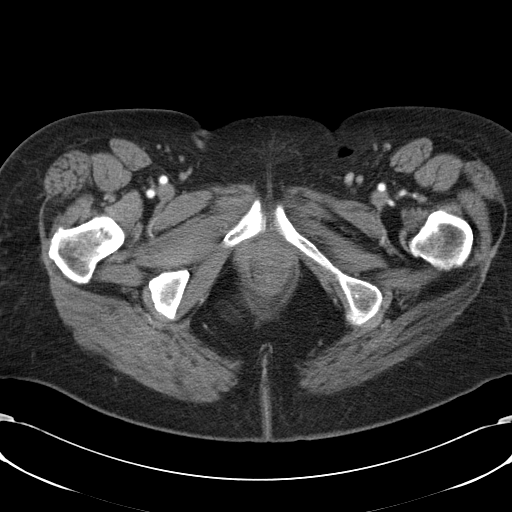
[im 8/123  bone]
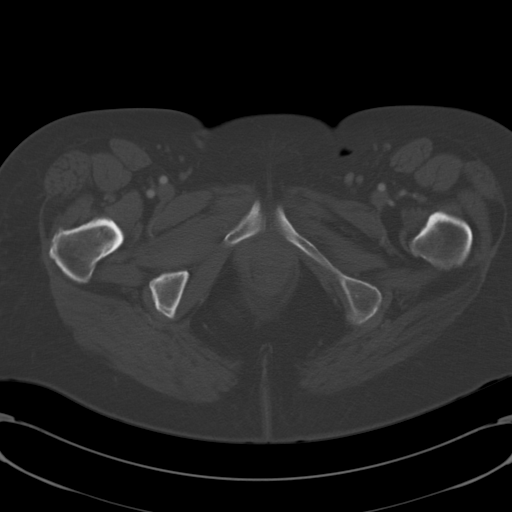
[im 22/123  soft-tissue]
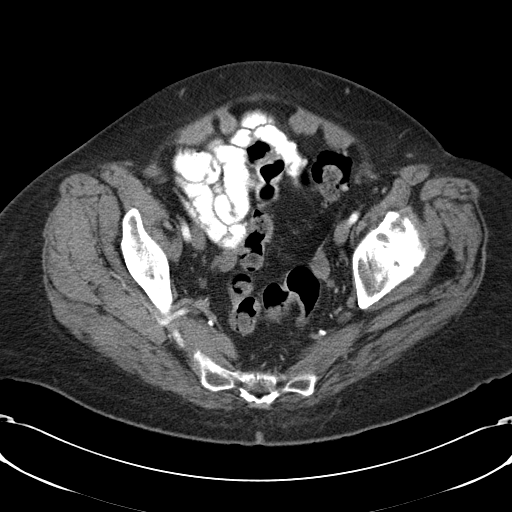
[im 29/123  soft-tissue]
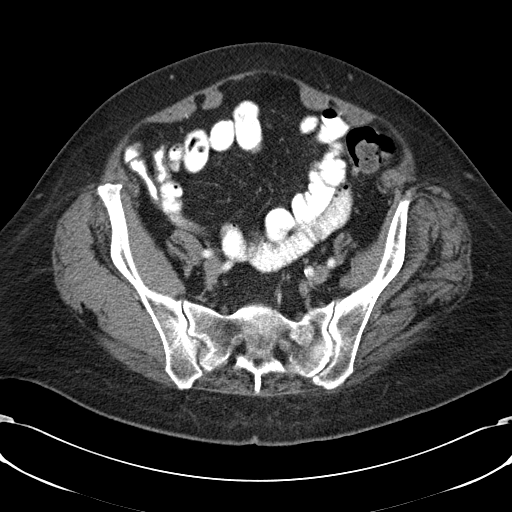
[im 36/123  soft-tissue]
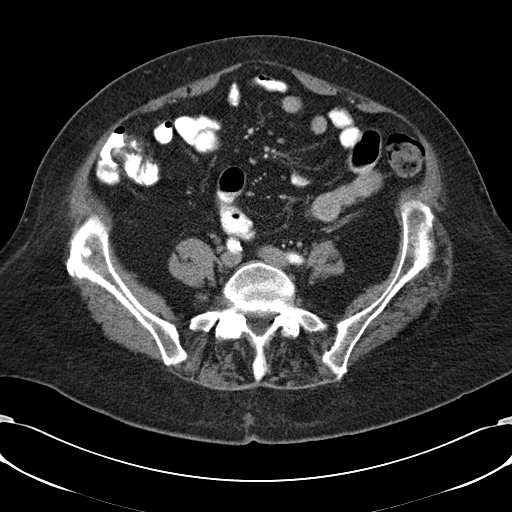
[im 51/123  soft-tissue]
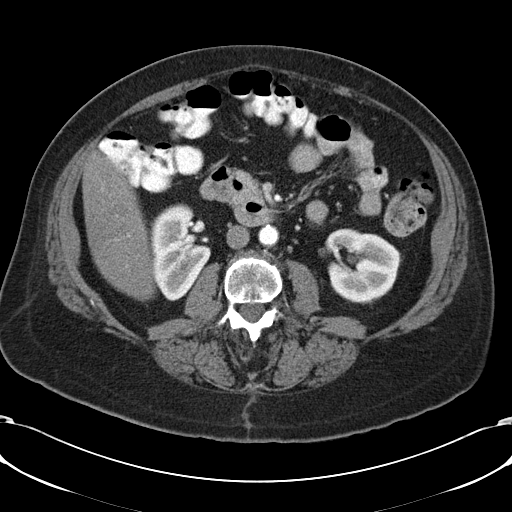
[im 58/123  soft-tissue]
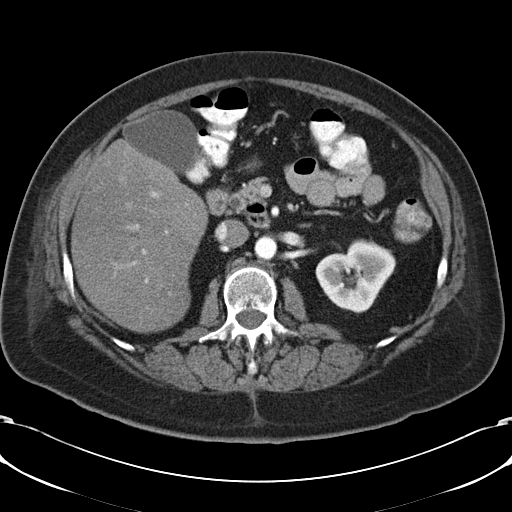
[im 65/123  soft-tissue]
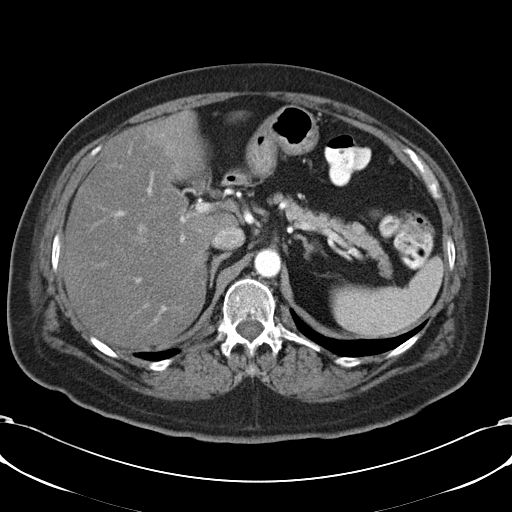
[im 79/123  soft-tissue]
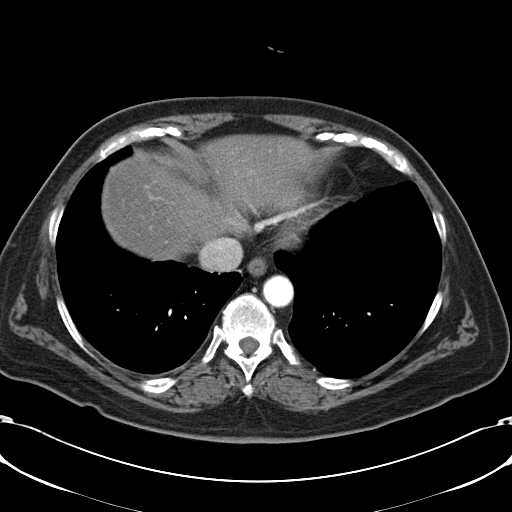
[im 87/123  soft-tissue]
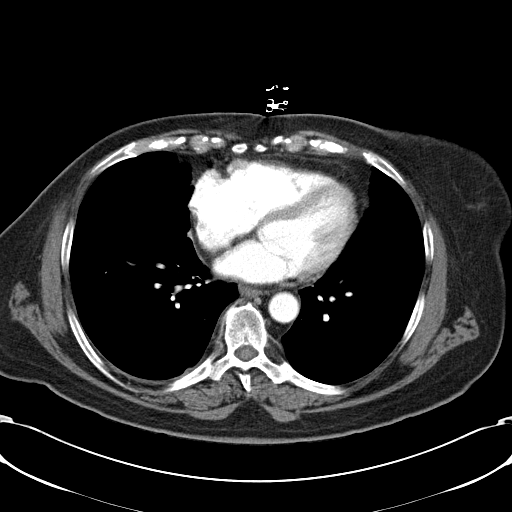
[im 87/123  bone]
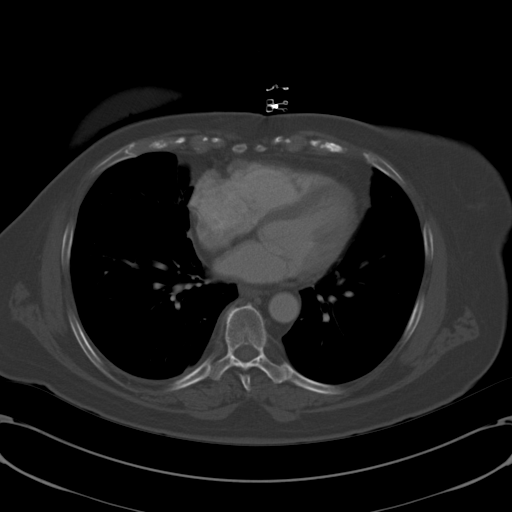
[im 94/123  soft-tissue]
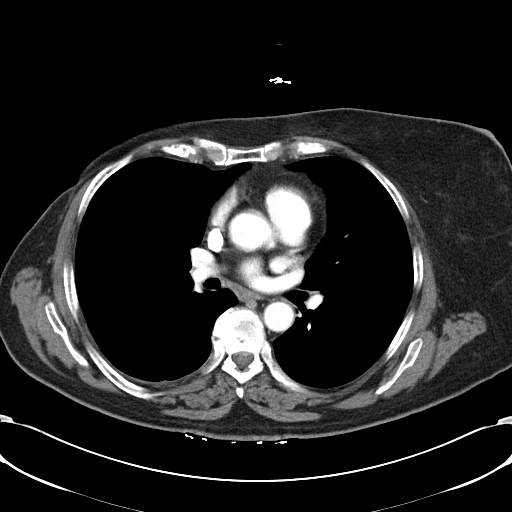
[im 108/123  soft-tissue]
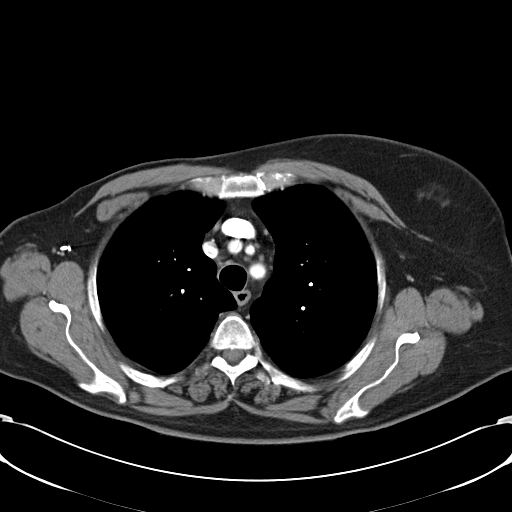
[im 115/123  soft-tissue]
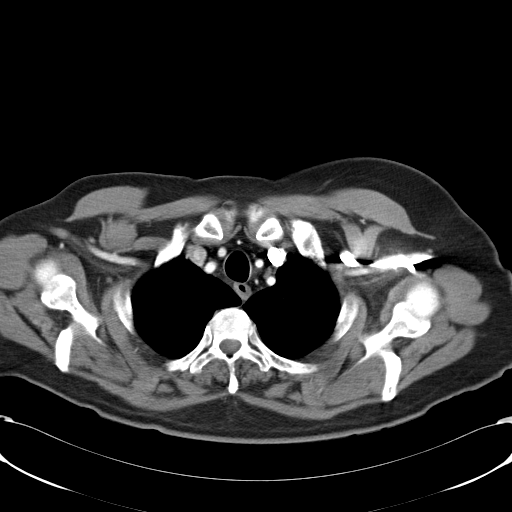

[Series 602: <mpr thick range> · coronal · 1.20mm/px · 3 of 91 slices shown]
[im 31/91  soft-tissue]
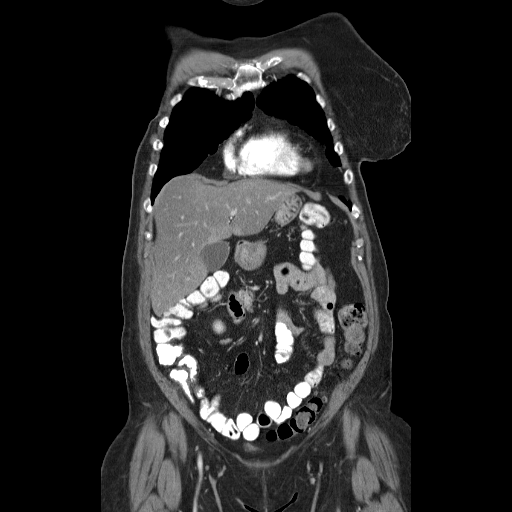
[im 41/91  soft-tissue]
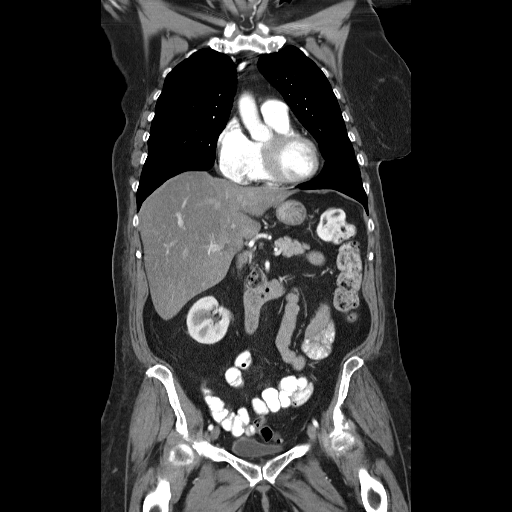
[im 51/91  soft-tissue]
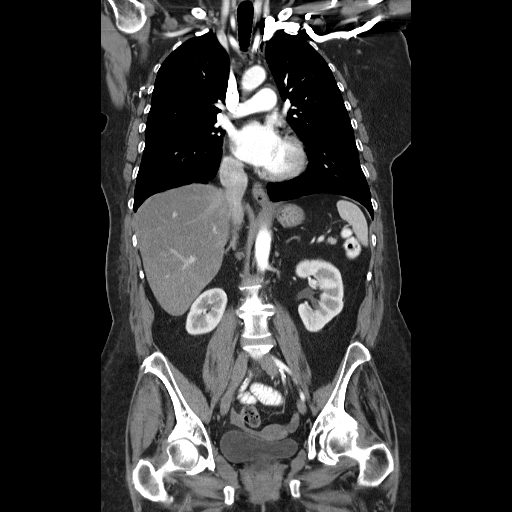

[15 of 46 positions shown; findings below may reference images not displayed]

FINDINGS: Recist

Target lesions:

1. Right lower lobe pulmonary nodule measures 6.5 mm on image number
41. It measured 6.3 mm on the prior study.
2. Partially calcified subcarinal lymph node measures 6.5 mm on
image number 28. This is unchanged.
3. Right hilar lymph node on image number 27 measures 5 mm. It
previously measured 4.5 mm.
Non target lesions:

Non measurable.

CT CHEST FINDINGS

Stable surgical changes from a right mastectomy. No chest wall
masses, supraclavicular or axillary lymphadenopathy. The left
breasts is grossly normal. A small nodule in the mid breast is
unchanged. The bony thorax is intact. No destructive bone lesions or
spinal canal compromise. Stable degenerative changes involving the
spine.

The heart is normal and stable in size. No pericardial effusion. No
mediastinal or hilar mass or adenopathy. Small scattered lymph nodes
are unchanged. The esophagus is grossly normal. The aorta is normal
in caliber. No dissection.

Examination of the lung parenchyma demonstrates a stable 6.5 mm
nodule in the right lower lobe. No new or worrisome pulmonary
lesions. No acute pulmonary findings. No pleural effusion.

CT ABDOMEN AND PELVIS FINDINGS

Diffuse fatty infiltration of the liver but no focal hepatic lesions
or intrahepatic biliary dilatation. Stable areas of focal fatty
sparing. The gallbladder is stable. There is a small nodule along
the anterior wall. This is most likely a polyp or focal adenomyoma.
No common bile duct dilatation. The pancreas is normal and stable.
The spleen is normal in size. No focal lesions. There is a stable
left adrenal gland nodule. The right adrenal gland is normal. Both
kidneys are unremarkable.

The stomach, duodenum, small bowel and colon are unremarkable. No
inflammatory changes, mass lesions or obstructive findings. No
mesenteric or retroperitoneal mass or adenopathy. The aorta and
branch vessels are patent. The major venous structures are patent.

The uterus and ovaries are normal. The bladder is normal. No pelvic
mass, adenopathy or free pelvic fluid collections. No inguinal mass
or adenopathy.

Examination the bony structures demonstrates advanced degenerative
changes involving the left hip. No acute or worrisome bone findings.
Stable faintly sclerotic lesion involving the left sacrum.
IMPRESSION: 1. Stable CT appearance of the chest, abdomen and pelvis. No
findings for recurrent or metastatic disease.
2. Stable sclerotic lesion in the left sacrum.
3. Stable heterogeneous hepatic steatosis.

## 2015-10-05 ENCOUNTER — Other Ambulatory Visit: Payer: Self-pay | Admitting: *Deleted

## 2015-10-05 ENCOUNTER — Other Ambulatory Visit (HOSPITAL_BASED_OUTPATIENT_CLINIC_OR_DEPARTMENT_OTHER): Payer: Medicare Other

## 2015-10-05 ENCOUNTER — Other Ambulatory Visit (HOSPITAL_COMMUNITY)
Admission: AD | Admit: 2015-10-05 | Discharge: 2015-10-05 | Disposition: A | Discharge status: Home / Self Care | Payer: Medicare Other | Source: Ambulatory Visit | Attending: Oncology | Admitting: Oncology

## 2015-10-05 ENCOUNTER — Ambulatory Visit (HOSPITAL_BASED_OUTPATIENT_CLINIC_OR_DEPARTMENT_OTHER): Payer: Medicare Other | Admitting: Oncology

## 2015-10-05 ENCOUNTER — Encounter: Payer: Self-pay | Admitting: *Deleted

## 2015-10-05 VITALS — BP 123/67 | HR 87 | Temp 97.8°F | Resp 18 | Ht 64.0 in | Wt 164.8 lb

## 2015-10-05 DIAGNOSIS — C50911 Malignant neoplasm of unspecified site of right female breast: Secondary | ICD-10-CM

## 2015-10-05 DIAGNOSIS — Z006 Encounter for examination for normal comparison and control in clinical research program: Secondary | ICD-10-CM

## 2015-10-05 DIAGNOSIS — C773 Secondary and unspecified malignant neoplasm of axilla and upper limb lymph nodes: Secondary | ICD-10-CM

## 2015-10-05 DIAGNOSIS — C7800 Secondary malignant neoplasm of unspecified lung: Principal | ICD-10-CM

## 2015-10-05 DIAGNOSIS — C7951 Secondary malignant neoplasm of bone: Secondary | ICD-10-CM | POA: Diagnosis not present

## 2015-10-05 DIAGNOSIS — Z79811 Long term (current) use of aromatase inhibitors: Secondary | ICD-10-CM | POA: Diagnosis not present

## 2015-10-05 DIAGNOSIS — C50811 Malignant neoplasm of overlapping sites of right female breast: Secondary | ICD-10-CM

## 2015-10-05 DIAGNOSIS — C50411 Malignant neoplasm of upper-outer quadrant of right female breast: Secondary | ICD-10-CM | POA: Diagnosis not present

## 2015-10-05 DIAGNOSIS — Z171 Estrogen receptor negative status [ER-]: Secondary | ICD-10-CM | POA: Diagnosis not present

## 2015-10-05 LAB — CBC WITH DIFFERENTIAL/PLATELET
BASO%: 0.8 % (ref 0.0–2.0)
BASOS ABS: 0.1 10*3/uL (ref 0.0–0.1)
EOS ABS: 0.2 10*3/uL (ref 0.0–0.5)
EOS%: 2.5 % (ref 0.0–7.0)
HCT: 41 % (ref 34.8–46.6)
HGB: 13.4 g/dL (ref 11.6–15.9)
LYMPH%: 19.1 % (ref 14.0–49.7)
MCH: 25.3 pg (ref 25.1–34.0)
MCHC: 32.7 g/dL (ref 31.5–36.0)
MCV: 77.5 fL — AB (ref 79.5–101.0)
MONO#: 0.8 10*3/uL (ref 0.1–0.9)
MONO%: 11.9 % (ref 0.0–14.0)
NEUT#: 4.1 10*3/uL (ref 1.5–6.5)
NEUT%: 65.7 % (ref 38.4–76.8)
Platelets: 323 10*3/uL (ref 145–400)
RBC: 5.29 10*6/uL (ref 3.70–5.45)
RDW: 15.9 % — ABNORMAL HIGH (ref 11.2–14.5)
WBC: 6.3 10*3/uL (ref 3.9–10.3)
lymph#: 1.2 10*3/uL (ref 0.9–3.3)

## 2015-10-05 LAB — COMPREHENSIVE METABOLIC PANEL
ALBUMIN: 3.5 g/dL (ref 3.5–5.0)
ALK PHOS: 237 U/L — AB (ref 40–150)
ALT: 29 U/L (ref 0–55)
AST: 39 U/L — ABNORMAL HIGH (ref 5–34)
Anion Gap: 11 mEq/L (ref 3–11)
BUN: 22.4 mg/dL (ref 7.0–26.0)
CO2: 19 meq/L — AB (ref 22–29)
Calcium: 10.1 mg/dL (ref 8.4–10.4)
Chloride: 108 mEq/L (ref 98–109)
Creatinine: 1 mg/dL (ref 0.6–1.1)
EGFR: 56 mL/min/{1.73_m2} — AB (ref >90)
GLUCOSE: 139 mg/dL (ref 70–140)
POTASSIUM: 4.2 meq/L (ref 3.5–5.1)
SODIUM: 139 meq/L (ref 136–145)
Total Bilirubin: 0.31 mg/dL (ref 0.20–1.20)
Total Protein: 8 g/dL (ref 6.4–8.3)

## 2015-10-05 LAB — URIC ACID: URIC ACID, SERUM: 3.5 mg/dL (ref 2.6–7.4)

## 2015-10-05 LAB — IRON AND TIBC
%SAT: 20 % — ABNORMAL LOW (ref 21–57)
IRON: 79 ug/dL (ref 41–142)
TIBC: 396 ug/dL (ref 236–444)
UIBC: 317 ug/dL (ref 120–384)

## 2015-10-05 LAB — BILIRUBIN, DIRECT: Bilirubin, Direct: <0.1 mg/dL — ABNORMAL LOW (ref 0.1–0.5)

## 2015-10-05 LAB — FERRITIN: FERRITIN: 161 ng/mL (ref 9–269)

## 2015-10-05 LAB — CK: CK TOTAL: 55 U/L (ref 38–234)

## 2015-10-05 LAB — PHOSPHORUS: Phosphorus: 3.4 mg/dL (ref 2.5–4.6)

## 2015-10-05 MED ORDER — GEMFIBROZIL 600 MG PO TABS: 600.0000 mg | ORAL_TABLET | Freq: Two times a day (BID) | ORAL | Status: DC

## 2015-10-05 MED ORDER — INV-EVEROLIMUS (RAD0001) 5MG TABLET NOVARTIS CRAD001Y24135: 2.0000 | ORAL_TABLET | Freq: Every day | ORAL | Status: DC

## 2015-10-05 NOTE — Progress Notes (Signed)
10/05/15 at 11:22am - [employer] 4/ cycle 53, day 1 study notes- The pt was into the cancer center this am for her fasting labs and cycle 53, day 1 assessments. The pt confirmed that she was fasting upon arrival to the cancer center for her labs. The pt was seen and examined today by Dr. Jana Hakim. He reviewed the pt's labs, and he felt that her abnormal lab values were "not clinically significant". The pt's vitals and weight were stable, and her mean BP was obtained for study purposes. Dr. Jana Hakim reviewed the pt's CT scans from 10/03/15. He confirmed that her target and non-target disease is stable. The pt's overall disease response is a "partial response".  He said that overall he is very pleased with her status, and he wishes to continue her study treatment without any modifications.He is concerned over the frequency of her CT scans.  He asked the research nurse to contact the study on his behalf about this issue.  The pt states she has been doing well. She recently golfed 18 holes and had a score of 85. She is performing all of her usual activities with some limitations. ECOG=1.  She reports the following AE's as ongoing: hot flashes, memory impairment, fatigue, nail changes, dry skin, and back pain. The left knee pain has improved, but it hasn't completely resolved.   She reports the following concomitant medications as ongoing: Lopressor, compazine prn, immodium prn, lopid, vitamin D, Tylenol arthritis prn, Prilosec, and ibuprofen prn. The pt reports that she is no longer taking her Ultram.  The pt also reports completing her doxycycline for her atypical lung infection.  The pt's current scan does not mention any signs of an "atypical lung infection".   The pt returned her cycle 52 everolimus study drug boxes. She confirmed that she took her everolimus (10 mg) every day along with letrozole (2.5 mg) from 09/07/15 - 10/04/15 (28 days). The pt's everolimus drug boxes had 0 remaining (unopened)  blister packs. The pt was dispensed 56 pills - 56 taken =0 pills. Therefore, the pt is 100% compliant with her study drug, everolimus, for cycle 52. The pt's everolimus was taken to the pharmacy for the drug accountability check and storage. The pt was dispensed her cycle 53 everolimus study drug boxes for self administration.   The pt is aware of her April appointments.The pt's lipid panel is pending. Brion Aliment RN, BSN, CCRP Clinical Research Nurse 10/05/2015 11:32 AM   10/06/15 at 9:13am- The pt's lipid panel is stable.   Brion Aliment RN, BSN, CCRP Clinical Research Nurse 10/06/2015 9:14 AM

## 2015-10-05 NOTE — Progress Notes (Signed)
[facility]  [phone] [phone]     ID: Barbara Parks DOB: 04-04-1951  MR#: [id number]  MWU#:[id number]  Patient Care Team: Darcus Austin, MD as PCP - General (Family Medicine) Jerline Pain, MD (Cardiology) Gaye Pollack, MD (Cardiothoracic Surgery) Chauncey Cruel, MD as Consulting Physician (Oncology) OTHER MD: Dr Erroll Luna- Merlene Laughter, DDS; Gaynelle Arabian MD  CHIEF COMPLAINT: metastatic breast cancer, on BOLERO study  CURRENT TREATMENT: letrozole + everolimus  BREAST CANCER HISTORY: From Dr. Collier [name]'s original intake note 04/13/2004:  "This woman has been in good health all of her life. She recently moved from [place] to work here.  She palpated a mass at about the 12 o'clock position in mid-July. She has not noticed any nipple retraction or skin changes.  She was seen by her primary care doctor who subsequently referred her for a mammogram.  Mammogram was performed on 03/01/04. This demonstrated a spiculated 2 cm mass at the 12 o'clock position in the right breast.  Upper outer quadrant of the left breast shows some distortion.   Physical exam at that time showed a firm, nontender nodule at the 12 o'clock position in the right breast, 5 cm from the nipple.  Ultrasound of this area showed a hypoechoic ill-defined mass, measuring 2.2 x 1.3 x 1.4 cm.  Physical exam of the left breast showed general vague thickening, upper outer quadrant of the left breast with a discrete palpable mass. The ultrasound performed showed a single hypoechoic ill-defined nodule at the 1 o'clock position, measuring 7 x 5 x 6 mm.  She had biopsies of both lesions on 03/02/03.  Needle core biopsy of the lesion on the right breast revealed invasive mammary carcinoma.  Needle core biopsy of the left breast showed a complex fibroadenoma.  Prognostic panel of the lesion on the right breast showed it to be ER positive at 73%, PR positive at 90% and proliferative index 9%, HER-2  was 1+.  Patient was referred to Dr. Annamaria Boots, who performed a simple mastectomy with sentinel lymph node evaluation on 03/22/04.  Final pathology showed this to be an invasive ductal carcinoma  with lobular features, measuring 2.2 cm, grade 2 of 3.  Margins negative for carcinoma.  Invasive ductal carcinoma was extended to involve deep dermis of the nipple.  Lymphovascular invasion was identified.  Total of 4 sentinel lymph nodes were evaluated.  Touch imprints at the time of the OR was felt to be negative.  Subsequent evaluation showed a 5 mm focus of metastatic carcinoma in one of the four lymph nodes on microscopic after sectioning.  There was extracapsular extension  of one lymph node as well. The remaining three lymph nodes were all negative."  The patient's subsequent history is detailed below.  INTERVAL HISTORY: Barbara Parks returns today for follow up of her metastatic breast cancer. Our research nurse, [name], was also present.Barbara Parks is currently on cycle 53 of the BOLERO-4 trial, receiving letrozole and everolimus. She has had no cough or mouth sores or any other side effects that she is aware of related to the everolimus. She does not have problems with hot flashes or vaginal dryness secondary to the letrozole.  REVIEW OF SYSTEMS: Barbara Parks was evaluated in the emergency room 09/20/2015 because she was feeling a little dizzy, weak, and like her mid chest was "fluttering". She was given fluids, they obtained an electrocardiogram which showed normal sinus rhythm and no evidence of ischemia, and a chest CT which could possibly  have of suggested an atypical infection so she was started on doxycycline, a course she just completed. None of those symptoms have recurred. Yesterday she walked through 18 holes on the course and scored an 85. Today she is having back pain and a little bit of discomfort down her left leg as a result. A detailed review of systems was otherwise noncontributory  PAST MEDICAL  HISTORY: Past Medical History  Diagnosis Date  . Shortness of breath   . Arthritis     left hip  . Osteopenia due to cancer therapy 09/09/2013  . Hypercholesteremia   . breast ca 2005    breast/chemo R mastectomy  . Metastasis to lung ([facility]) dx'd 08/2011  . GERD (gastroesophageal reflux disease)   . Palpitations   . Asthma     PAST SURGICAL HISTORY: Past Surgical History  Procedure Laterality Date  . Spine surgery  1996  . Breast surgery  2005    right  . Video bronchoscopy  09/11/2011    Procedure: VIDEO BRONCHOSCOPY;  Surgeon: Gaye Pollack, MD;  Location: [facility];  Service: Thoracic;  Laterality: N/A;  . Chest tube insertion  09/11/2011    Procedure: INSERTION PLEURAL DRAINAGE CATHETER;  Surgeon: Gaye Pollack, MD;  Location: [facility];  Service: Thoracic;  Laterality: Right;  . Pericardial window  09/11/2011    Procedure: PERICARDIAL WINDOW;  Surgeon: Gaye Pollack, MD;  Location: [facility];  Service: Thoracic;  Laterality: N/A;  . Removal of pleural drainage catheter  12/19/2011    Procedure: REMOVAL OF PLEURAL DRAINAGE CATHETER;  Surgeon: Gaye Pollack, MD;  Location: [facility];  Service: Thoracic;  Laterality: Right;  TO BE DONE IN MINOR ROOM, SHORT STAY  . Cataract extraction w/phaco Left 01/18/2014    Procedure: CATARACT EXTRACTION PHACO AND INTRAOCULAR LENS PLACEMENT (IOC);  Surgeon: Elta Guadeloupe T. Gershon Crane, MD;  Location: [facility];  Service: Ophthalmology;  Laterality: Left;  CDE 15.79  . Cataract extraction w/phaco Right 02/08/2014    Procedure: CATARACT EXTRACTION PHACO AND INTRAOCULAR LENS PLACEMENT (IOC);  Surgeon: Elta Guadeloupe T. Gershon Crane, MD;  Location: [facility];  Service: Ophthalmology;  Laterality: Right;  CDE 4.41  . Total hip arthroplasty Left 06/07/2015    Procedure: LEFT TOTAL HIP ARTHROPLASTY ANTERIOR APPROACH;  Surgeon: Gaynelle Arabian, MD;  Location: [facility];  Service: Orthopedics;  Laterality: Left;    FAMILY HISTORY Family History  Problem Relation Age of Onset  . Cancer Mother     breast    . Heart disease Father   . Anesthesia problems Neg Hx    the patient's mother was diagnosed with breast cancer at age 4, she died age 61 from congestive heart failure..The patient's father died from heart disease at age 56.  She has one sister alive & well.  Two brothers alive & well, one with diabetes. The patient's sister was also diagnosed with breast cancer, and was tested for the BRCA gene, and was negative. The patient herself has not been tested. There is no history of ovarian cancer in the family  GYNECOLOGIC HISTORY:  No LMP recorded. Patient is postmenopausal. Menarche age 52, the patient is GX P0. She stopped having periods with chemotherapy in 2005. 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 She was a Archivist. She is single, lives alone with her Shitzu-poodle Sammie.. Family is all in the [place] area.  ADVANCED DIRECTIVES: Not in place. At the 02/23/2014 visit the patient was given the appropriate documents to complete and notarize at her discretion. She tells me she is planning to name her sister, Barbara Parks, as healthcare power of attorney. Barbara Parks can be reached at [phone]   HEALTH MAINTENANCE: Social History  Substance Use Topics  . Smoking status: Former Smoker -- 1.50 packs/day for 30 years    Types: Cigarettes    Quit date: 09/10/2007  . Smokeless tobacco: Not on file  . Alcohol Use: No     Colonoscopy:  PAP:  Bone density: 08/14/2015, T score -1.6  Lipid panel:  Allergies  Allergen Reactions  . Aspirin     REACTION: upset stomach  . Latex Other (See Comments)    Blistering and skin peels off  . Nsaids Nausea And Vomiting    Extreme nausea and vomiting    Current Outpatient Prescriptions  Medication Sig Dispense Refill  . cholecalciferol (VITAMIN D) 1000 units tablet Take 1,000 Units by mouth 2 (two) times daily.    Marland Kitchen  doxycycline (VIBRA-TABS) 100 MG tablet Take 1 tablet (100 mg total) by mouth 2 (two) times daily. 14 tablet 0  . gemfibrozil (LOPID) 600 MG tablet TAKE ONE TABLET BY MOUTH TWICE DAILY BEFORE A MEAL 60 tablet 0  . Investigational everolimus (RAD001) 5 MG tablet Novartis [id number] Take 2 tablets by mouth daily. Take with a glass of water. 70 tablet 0  . letrozole (FEMARA) 2.5 MG tablet Take 1 tablet (2.5 mg total) by mouth daily. 30 tablet 0  . loperamide (IMODIUM) 2 MG capsule Take 2 mg by mouth 4 (four) times daily as needed for diarrhea or loose stools. Reported on 07/13/2015    . LORazepam (ATIVAN) 0.5 MG tablet Take 1 tablet (0.5 mg total) by mouth every 6 (six) hours as needed (Nausea or vomiting). (Patient not taking: Reported on 09/20/2015) 30 tablet 0  . metoprolol tartrate (LOPRESSOR) 25 MG tablet Take 0.5 tablets (12.5 mg total) by mouth daily. 30 tablet 4  . omeprazole (PRILOSEC) 40 MG capsule Take 1 capsule (40 mg total) by mouth daily as needed. 90 capsule 3  . ondansetron (ZOFRAN) 8 MG tablet Take 1 tablet (8 mg total) by mouth 2 (two) times daily as needed (Nausea or vomiting). (Patient not taking: Reported on 09/20/2015) 30 tablet 1  . prochlorperazine (COMPAZINE) 10 MG tablet Take 1 tablet (10 mg total) by mouth every 6 (six) hours as needed (Nausea or vomiting). 30 tablet 1  . prochlorperazine (COMPAZINE) 25 MG suppository Place 1 suppository (25 mg total) rectally every 12 (twelve) hours as needed for nausea. (Patient not taking: Reported on 09/20/2015) 12 suppository 3   No current facility-administered medications for this visit.    OBJECTIVE: Middle-aged white woman In no acute distress Filed Vitals:   10/05/15 0926  BP: 134/70  Pulse: 87  Temp: 97.8 F (36.6 C)  Resp: 18     Body mass index is 28.27 kg/(m^2).    ECOG FS:1 - Symptomatic but completely ambulatory  Sclerae unicteric, EOMs intact Oropharynx clear, dentition in good repair No cervical or supraclavicular  adenopathy Lungs no rales or rhonchi Heart regular rate and rhythm Abd soft, nontender, positive bowel sounds MSK no focal spinal tenderness, no upper extremity lymphedema, normal straight leg raising bilaterally Neuro: nonfocal, well oriented, appropriate affect Breasts: Deferred    LAB RESULTS:  CMP     Component Value Date/Time   NA 137 09/20/2015 1257   NA 137  09/07/2015 0847   K 4.3 09/20/2015 1257   K 4.2 09/07/2015 0847   CL 106 09/20/2015 1257   CL 109* 12/31/2012 0940   CO2 23 09/20/2015 1257   CO2 16* 09/07/2015 0847   GLUCOSE 265* 09/20/2015 1257   GLUCOSE 149* 09/07/2015 0847   GLUCOSE 127* 12/31/2012 0940   BUN 20 09/20/2015 1257   BUN 20.9 09/07/2015 0847   CREATININE 0.80 09/20/2015 1257   CREATININE 0.9 09/07/2015 0847   CALCIUM 9.3 09/20/2015 1257   CALCIUM 10.2 09/07/2015 0847   PROT 8.3 09/07/2015 0847   PROT 7.4 05/29/2015 1415   ALBUMIN 3.6 09/07/2015 0847   ALBUMIN 3.6 05/29/2015 1415   AST 33 09/07/2015 0847   AST 51* 05/29/2015 1415   ALT 24 09/07/2015 0847   ALT 39 05/29/2015 1415   ALKPHOS 234* 09/07/2015 0847   ALKPHOS 203* 05/29/2015 1415   BILITOT <0.30 09/07/2015 0847   BILITOT 0.3 05/29/2015 1415   GFRNONAA >60 09/20/2015 1257   GFRAA >60 09/20/2015 1257    I No results found for: SPEP  Lab Results  Component Value Date   WBC 6.3 10/05/2015   NEUTROABS 4.1 10/05/2015   HGB 13.4 10/05/2015   HCT 41.0 10/05/2015   MCV 77.5* 10/05/2015   PLT 323 10/05/2015      Chemistry      Component Value Date/Time   NA 137 09/20/2015 1257   NA 137 09/07/2015 0847   K 4.3 09/20/2015 1257   K 4.2 09/07/2015 0847   CL 106 09/20/2015 1257   CL 109* 12/31/2012 0940   CO2 23 09/20/2015 1257   CO2 16* 09/07/2015 0847   BUN 20 09/20/2015 1257   BUN 20.9 09/07/2015 0847   CREATININE 0.80 09/20/2015 1257   CREATININE 0.9 09/07/2015 0847      Component Value Date/Time   CALCIUM 9.3 09/20/2015 1257   CALCIUM 10.2 09/07/2015 0847    ALKPHOS 234* 09/07/2015 0847   ALKPHOS 203* 05/29/2015 1415   AST 33 09/07/2015 0847   AST 51* 05/29/2015 1415   ALT 24 09/07/2015 0847   ALT 39 05/29/2015 1415   BILITOT <0.30 09/07/2015 0847   BILITOT 0.3 05/29/2015 1415       Lab Results  Component Value Date   LABCA2 58* 09/14/2012    No components found for: [id number]  No results for input(s): INR in the last 168 hours.  Urinalysis    Component Value Date/Time   COLORURINE YELLOW 09/20/2015 1432   APPEARANCEUR HAZY* 09/20/2015 1432   LABSPEC >1.030* 09/20/2015 1432   LABSPEC 1.030 07/13/2015 0841   PHURINE 5.5 09/20/2015 1432   PHURINE 6.0 07/13/2015 0841   GLUCOSEU 500* 09/20/2015 1432   GLUCOSEU Negative 07/13/2015 0841   HGBUR MODERATE* 09/20/2015 1432   HGBUR Large 07/13/2015 0841   BILIRUBINUR NEGATIVE 09/20/2015 1432   BILIRUBINUR Negative 07/13/2015 0841   KETONESUR NEGATIVE 09/20/2015 1432   KETONESUR Negative 07/13/2015 0841   PROTEINUR 30* 09/20/2015 1432   PROTEINUR 300 07/13/2015 0841   UROBILINOGEN 0.2 07/13/2015 0841   UROBILINOGEN 1.0 05/29/2015 1420   NITRITE NEGATIVE 09/20/2015 1432   NITRITE Negative 07/13/2015 0841   LEUKOCYTESUR NEGATIVE 09/20/2015 1432   LEUKOCYTESUR Negative 07/13/2015 0841    STUDIES: Dg Chest 2 View  09/20/2015  CLINICAL DATA:  Chest pain and shortness of breath. EXAM: CHEST  2 VIEW COMPARISON:  Chest CT 08/08/2015 FINDINGS: Normal heart size and mediastinal contours. No acute infiltrate or edema. No effusion or pneumothorax. Right  axillary dissection and mastectomy. No acute osseous findings. IMPRESSION: No acute finding. Electronically Signed   By: Monte Fantasia M.D.   On: 09/20/2015 12:30   Ct Chest W Contrast  10/03/2015  CLINICAL DATA:  Metastatic breast cancer diagnosed 08/2003 and 08/2011. EXAM: CT CHEST, ABDOMEN, AND PELVIS WITH CONTRAST TECHNIQUE: Multidetector CT imaging of the chest, abdomen and pelvis was performed following the standard protocol during  bolus administration of intravenous contrast. CONTRAST:  163m OMNIPAQUE IOHEXOL 300 MG/ML  SOLN COMPARISON:  Chest CT of 09/20/2015. Chest item pelvic CTs of 08/08/2015. FINDINGS: RECIST 1.0 Target Lesions: 1. Right lower lobe 5 mm pulmonary nodule on image 37/series 4, similar. 2. 6 mm subcarinal node on image 28/series 2, similar. Non-target Lesions: 1. Right-sided pleural thickening and trace fluid, similar. CT CHEST FINDINGS Mediastinum/Nodes: No supraclavicular adenopathy. right axillary node dissection with mastectomy. No axillary adenopathy. Normal heart size, without pericardial effusion. Mild lipomatous hypertrophy of the interatrial septum. No central pulmonary embolism, on this non-dedicated study. No mediastinal or hilar adenopathy. No internal mammary adenopathy. Lungs/Pleura: Trace right-sided pleural thickening, similar. Minimal right pleural fluid inferiorly and medially, similar. Probable secretions in the dependent trachea. Subpleural right lower lobe 5 mm nodule is unchanged on image 37/ series 4. Clear left lung. Musculoskeletal: Sclerosis involving the posterior medial left tenth rib is unchanged. There is also subtle sclerosis involving the lateral aspect of the right second rib. CT ABDOMEN PELVIS FINDINGS Hepatobiliary: Moderate hepatic steatosis with sparing adjacent the gallbladder. Vague enhancement in the right lobe of the liver measures 11 mm on image 52/series 2 and is similar, favoring a perfusion anomaly. This is present back to 07/12/2014 at least. Soft tissue density at the gallbladder fundus measures 8 mm on image 68/series 2 and is not readily apparent on the prior. upper normal sized common duct, 7 mm. Pancreas: Mild pancreatic atrophy, without duct dilatation. Periampullary duodenal diverticula noted. Spleen: Normal in size, without focal abnormality. Adrenals/Urinary Tract: Normal right adrenal gland. A left adrenal nodule is similar at 10 mm. Degraded evaluation of the pelvis,  secondary to beam hardening artifact from left hip arthroplasty. Grossly normal urinary bladder. Stomach/Bowel: Normal stomach, without wall thickening. Normal colon, appendix, and terminal ileum. Otherwise normal small bowel. Vascular/Lymphatic: Aortic and branch vessel atherosclerosis. No abdominal and no gross pelvic adenopathy. Reproductive: Normal uterus and adnexa. Other: No significant free fluid. No evidence of omental or peritoneal disease. Musculoskeletal: Left hip arthroplasty. Similar sclerotic lesions involving the left iliac wing and left side of the sacrum. IMPRESSION: 1. Similar sclerotic lesions, likely related to treated osseous metastasis. 2. No findings to suggest extra osseous metastatic disease. 3. Similar tiny right lower lobe pulmonary nodule. 4. Hepatic steatosis. 5. Similar left adrenal nodule, likely an adenoma. 6. Right hepatic lobe focus of hyper enhancement is similar back to 2015 and likely a perfusion anomaly. 7. Suspicion of gallbladder adenomyomatosis. Recommend attention on follow-up. Electronically Signed   By: KAbigail MiyamotoM.D.   On: 10/03/2015 14:00   Ct Angio Chest Pe W/cm &/or Wo Cm  09/20/2015  CLINICAL DATA:  65year old female with palpitations for 5 days. Shortness of breath for 3 days. Breast cancer taking oral chemotherapy. Subsequent encounter. EXAM: CT ANGIOGRAPHY CHEST WITH CONTRAST TECHNIQUE: Multidetector CT imaging of the chest was performed using the standard protocol during bolus administration of intravenous contrast. Multiplanar CT image reconstructions and MIPs were obtained to evaluate the vascular anatomy. CONTRAST:  1075mOMNIPAQUE IOHEXOL 350 MG/ML SOLN COMPARISON:  Chest radiographs 1205 hours  today. Restaging CTs 08/08/2015 FINDINGS: Good contrast bolus timing in the pulmonary arterial tree. No focal filling defect identified in the pulmonary arterial tree to suggest the presence of acute pulmonary embolism. Lower lung volumes with bilateral crowding  which may explain new bilateral pulmonary ground-glass opacity. Major airways are patent aside from atelectatic changes. Trace right pleural effusion or pleural thickening is unchanged. Stable 5 mm subpleural right lower lobe nodule (series 5, image 50). Stable mild scarring or atelectasis in the left costophrenic angle. No pericardial effusion. Negative thoracic aorta aside from mild atherosclerosis. Sequelae of right mastectomy. No axillary or internal mammary lymphadenopathy. No mediastinal or hilar lymphadenopathy. Mild cardiomegaly. Hepatic steatosis Re demonstrated. Stable visualized liver, spleen, pancreas, adrenal glands, and bowel in the upper abdomen. Stable sclerotic appearance of the posterior left eleventh and right twelfth ribs. No acute or other suspicious osseous lesion identified in the chest. Review of the MIP images confirms the above findings. IMPRESSION: 1.  No evidence of acute pulmonary embolus. 2. Lower lung volumes with new nonspecific pulmonary ground-glass opacity. This might simply reflect atelectasis but consider also atypical infection, less likely developing edema. 3. Otherwise stable CT appearance of the chest since January. Electronically Signed   By: Odessa Fleming M.D.   On: 09/20/2015 14:59   Ct Abdomen Pelvis W Contrast  10/03/2015  CLINICAL DATA:  Metastatic breast cancer diagnosed 08/2003 and 08/2011. EXAM: CT CHEST, ABDOMEN, AND PELVIS WITH CONTRAST TECHNIQUE: Multidetector CT imaging of the chest, abdomen and pelvis was performed following the standard protocol during bolus administration of intravenous contrast. CONTRAST:  OMNIPAQUE IOHEXOL 300 MG/ML  SOLN COMPARISON:  Chest CT of 09/20/2015. Chest item pelvic CTs of 08/08/2015. FINDINGS: RECIST 1.0 Target Lesions: 1. Right lower lobe 5 mm pulmonary nodule on image 37/series 4, similar. 2. 6 mm subcarinal node on image 28/series 2, similar. Non-target Lesions: 1. Right-sided pleural thickening and trace fluid, similar. CT  CHEST FINDINGS Mediastinum/Nodes: No supraclavicular adenopathy. right axillary node dissection with mastectomy. No axillary adenopathy. Normal heart size, without pericardial effusion. Mild lipomatous hypertrophy of the interatrial septum. No central pulmonary embolism, on this non-dedicated study. No mediastinal or hilar adenopathy. No internal mammary adenopathy. Lungs/Pleura: Trace right-sided pleural thickening, similar. Minimal right pleural fluid inferiorly and medially, similar. Probable secretions in the dependent trachea. Subpleural right lower lobe 5 mm nodule is unchanged on image 37/ series 4. Clear left lung. Musculoskeletal: Sclerosis involving the posterior medial left tenth rib is unchanged. There is also subtle sclerosis involving the lateral aspect of the right second rib. CT ABDOMEN PELVIS FINDINGS Hepatobiliary: Moderate hepatic steatosis with sparing adjacent the gallbladder. Vague enhancement in the right lobe of the liver measures 11 mm on image 52/series 2 and is similar, favoring a perfusion anomaly. This is present back to 07/12/2014 at least. Soft tissue density at the gallbladder fundus measures 8 mm on image 68/series 2 and is not readily apparent on the prior. upper normal sized common duct, 7 mm. Pancreas: Mild pancreatic atrophy, without duct dilatation. Periampullary duodenal diverticula noted. Spleen: Normal in size, without focal abnormality. Adrenals/Urinary Tract: Normal right adrenal gland. A left adrenal nodule is similar at 10 mm. Degraded evaluation of the pelvis, secondary to beam hardening artifact from left hip arthroplasty. Grossly normal urinary bladder. Stomach/Bowel: Normal stomach, without wall thickening. Normal colon, appendix, and terminal ileum. Otherwise normal small bowel. Vascular/Lymphatic: Aortic and branch vessel atherosclerosis. No abdominal and no gross pelvic adenopathy. Reproductive: Normal uterus and adnexa. Other: No significant free fluid.  No  evidence of omental or peritoneal disease. Musculoskeletal: Left hip arthroplasty. Similar sclerotic lesions involving the left iliac wing and left side of the sacrum. IMPRESSION: 1. Similar sclerotic lesions, likely related to treated osseous metastasis. 2. No findings to suggest extra osseous metastatic disease. 3. Similar tiny right lower lobe pulmonary nodule. 4. Hepatic steatosis. 5. Similar left adrenal nodule, likely an adenoma. 6. Right hepatic lobe focus of hyper enhancement is similar back to 2015 and likely a perfusion anomaly. 7. Suspicion of gallbladder adenomyomatosis. Recommend attention on follow-up. Electronically Signed   By: Abigail Miyamoto M.D.   On: 10/03/2015 14:00      ASSESSMENT: 65 y.o. Barbara Parks, Barbara Parks woman with stage IV breast cancer, on BOLERO-4 trial  (1) status post right mastectomy and sentinel lymph node sampling 03/22/2004 for a pT2 pN1, stage IIB invasive ductal carcinoma with lobular features, grade 2, estrogen receptor and progesterone receptor positive, HER-2 negative, with an MIB-1 of 9% ([id number] and [id number])  (2) addition all right axillary lymph node sampling 05/07/2004 showed 2 benign lymph nodes (4 lymph nodes previously removed, so total was one positive lymph node out of 6; [id number])  (3) the patient was evaluated by radiation oncology; no postmastectomy radiation recommended  (4) adjuvant chemotherapy with dose dense doxorubicin and cyclophosphamide x4 cycles (first cycle delayed one week) followed by dose dense paclitaxel x4 was completed 09/18/2004  (5) tamoxifen started March 2006, discontinued 2009  METASTATIC DISEASE: (6) presenting with a large pericardial effusion, large right pleural effusion and possible right middle lobe bronchial obstruction February 2013, status post pericardial window placement, fiberoptic bronchoscopy and right Pleurx placement 09/11/2011, with biopsy of the bronchus intermedius and pericardium positive for metastatic breast  cancer, estrogen receptor 91% positive with moderate staining intensity, progesterone receptor 100% positive with strong staining intensity, with an MIB-1 of 35%, and no HER-2 amplification, the signals ratio being 1.37 ([id number])  (7) enrolled in BOLERO-4 trial 10/10/2011, receiving letrozole and everolimus  (a) two small areas of enhancement in the cerebellum noted by brain MRI 10/03/2011 were no longer apparent on repeat MRI 08/21/2012-- most recent brain MRI 04/01/2014 showed no evidence of intracranial metastatic disease  (b) sclerotic lesions in left iliac bone and sacrum have not been biopsied; stable; plan is to start zolendronate after patient updates her dental care (extraction planned)  (c) RLL lung nodule, stable (rescanned 07/12/2014 and 08/11/2014) R hilar and subcarinal lymph nodes: stable  (9) additional problems:  (a) hepatic steatosis  (b) COPD/ emphysema/ asthma  (c) advanced L hip osteoarthritis, status post left total hip replacement 06/07/2015  (d) aortoiliac atherosclerosis  (e) dental evaluation pending w possible dental extractions  (f) possible thalassemia trait  (10) Bone density concerns: DEXA scan 08/11/2013 was normal   PLAN: Alantis continues to do remarkably well. In particular she is tolerating the letrozole and everolimus without any obvious side effects. There has not been any evidence of progression.  She has had quite a few CT scans and I am concerned regarding the cumulative radiation dose. We are going to ask the study chair if we can start broadening that the scan intervals. This will also make her life a little bit easier.  If she continues to have the back pain over the next 3 or 4 days I asked her to call Dr. Wynelle Link, but I expect she just overdid it a little bit yesterday and things will settle down.  I think the "flutter" she had late February was related to reflux. I  encouraged her to take her Prilosec as prescribed. She should cut back on coffee,  and similar drugs of May make that problem worse.  Otherwise she is already scheduled to see me again in early April for her next appointment. She knows to call for any other issues that may develop before then. Chauncey Cruel, MD   10/05/2015 9:28 AM

## 2015-10-06 LAB — LIPID PANEL
CHOL/HDL RATIO: 4.8 ratio — AB (ref 0.0–4.4)
Cholesterol, Total: 249 mg/dL — ABNORMAL HIGH (ref 100–199)
HDL: 52 mg/dL (ref >39)
LDL CALC: 141 mg/dL — AB (ref 0–99)
TRIGLYCERIDES: 280 mg/dL — AB (ref 0–149)
VLDL Cholesterol Cal: 56 mg/dL — ABNORMAL HIGH (ref 5–40)

## 2015-10-14 IMAGING — MR MR HEAD WO/W CM
12 of 15 series · 36 of 48 positions shown · IV contrast (16    MULTIHANCE)
Comparison: 08/21/2012 and 10/03/2011 brain MR.

CLINICAL DATA: History of breast cancer. Follow-up. No new
symptoms.

EXAM:
MRI HEAD WITHOUT AND WITH CONTRAST
TECHNIQUE: Multiplanar, multiecho pulse sequences of the brain and surrounding
structures were obtained without and with intravenous contrast.
CONTRAST:  16mL MULTIHANCE GADOBENATE DIMEGLUMINE 529 MG/ML IV SOLN

[Series 3: DWI · coronal · 5.0mm · 1.09mm/px · 7 of 72 slices shown (1 of 6)]
[im 1/72]
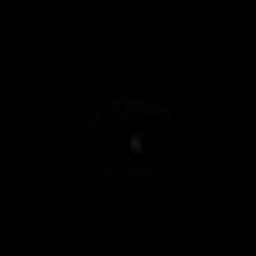
[im 12/72]
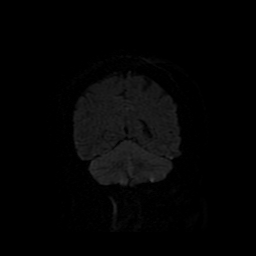
[im 24/72]
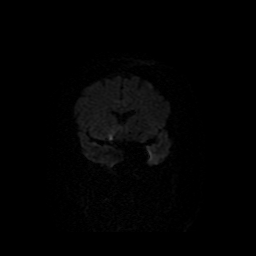
[im 36/72]
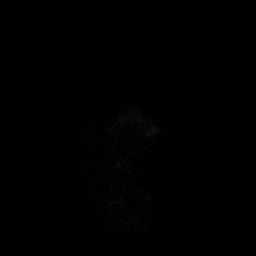
[im 48/72]
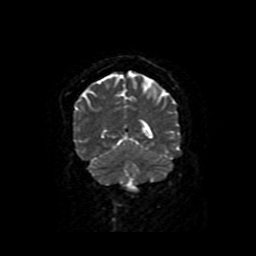
[im 60/72]
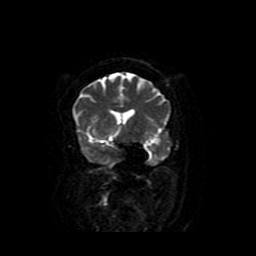
[im 72/72]
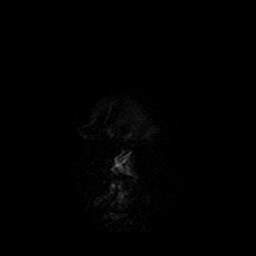

[Series 4: T2 · axial · 5.0mm · 0.43mm/px · z∈[-65,+84]mm · 2 of 24 slices shown]
[im 1/24]
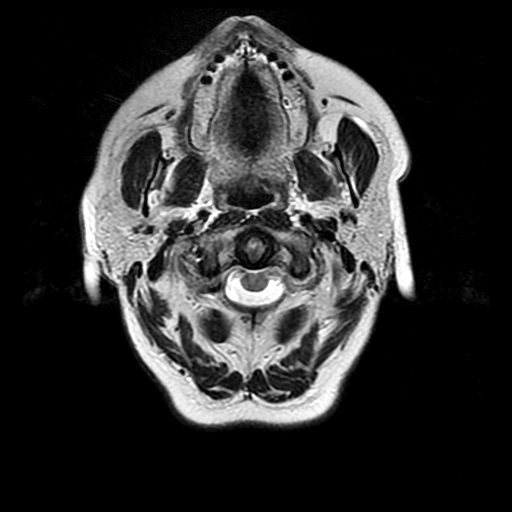
[im 24/24]
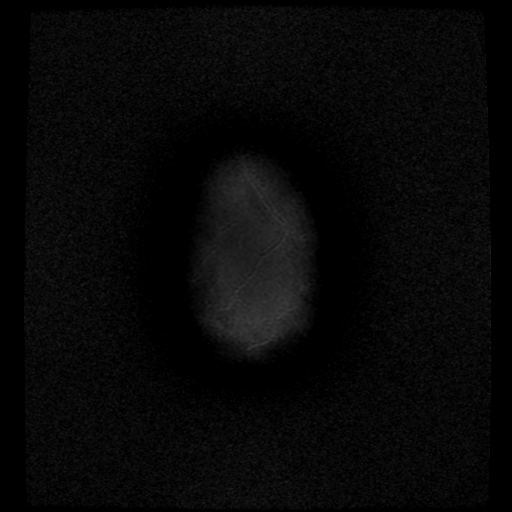

[Series 4: T1 · sagittal · 5.0mm · 0.47mm/px · 2 of 24 slices shown]
[im 1/24]
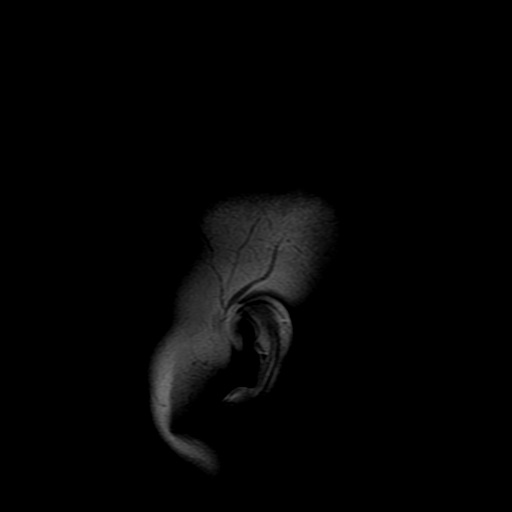
[im 24/24]
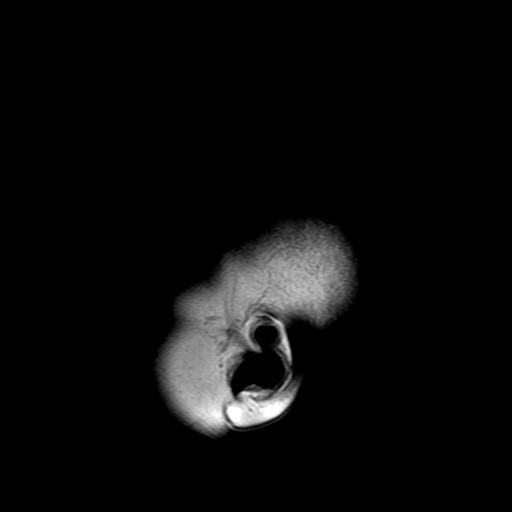

[Series 5: FLAIR · axial · 5.0mm · 0.43mm/px · z∈[-71,+90]mm · 2 of 24 slices shown]
[im 1/24]
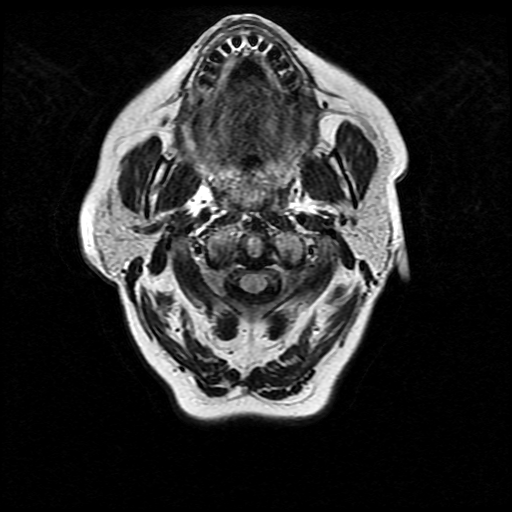
[im 24/24]
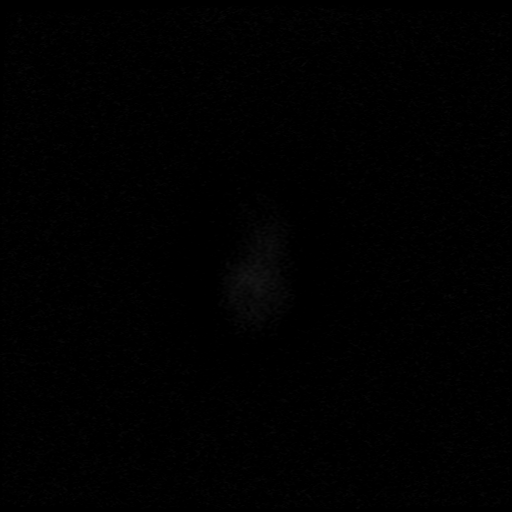

[Series 7: DWI · axial · 5.0mm · 1.09mm/px · z∈[-68,+82]mm · 5 of 62 slices shown (2 of 6)]
[im 1/62]
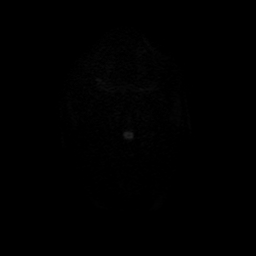
[im 16/62]
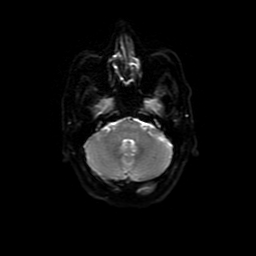
[im 31/62]
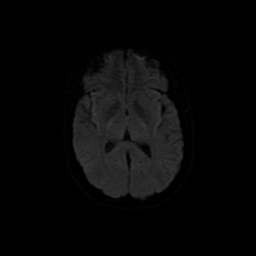
[im 46/62]
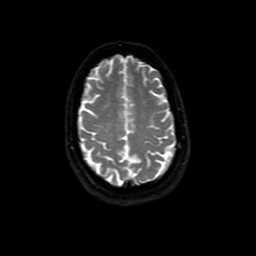
[im 62/62]
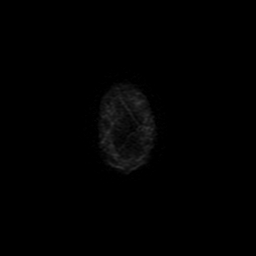

[Series 9: T2 post-contrast · coronal · 5.0mm · 0.45mm/px · 2 of 29 slices shown]
[im 1/29]
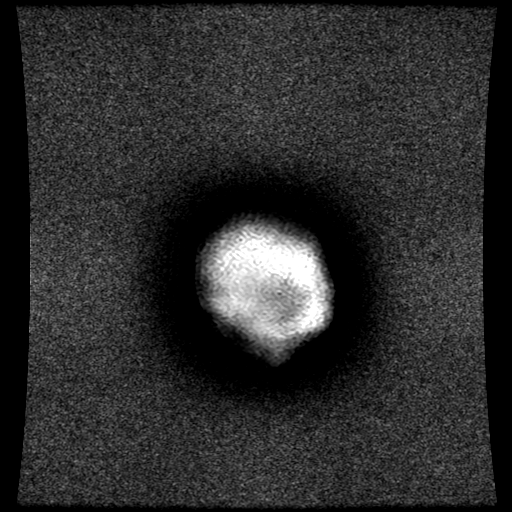
[im 29/29]
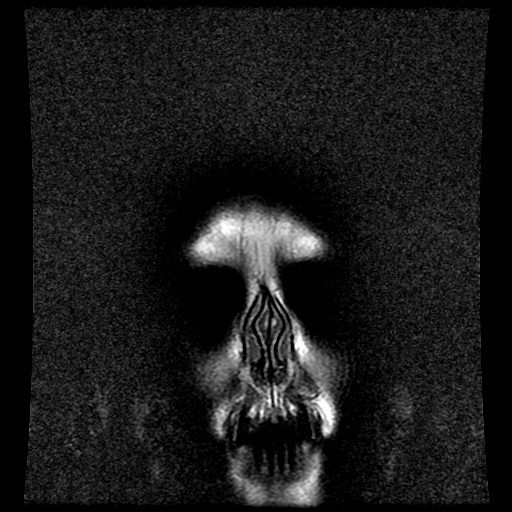

[Series 11: T1 post-contrast · coronal · 5.0mm · 0.45mm/px · 2 of 29 slices shown (1 of 2)]
[im 1/29]
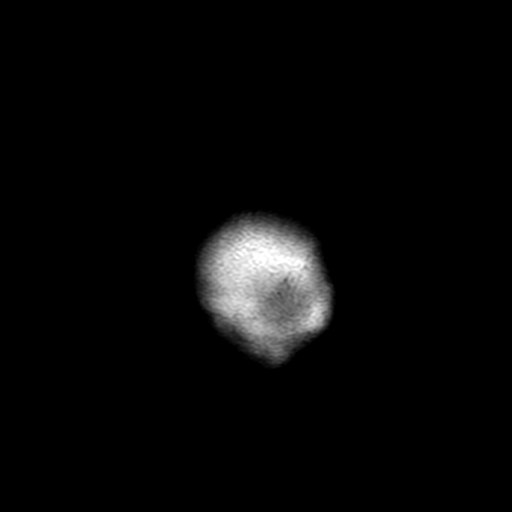
[im 29/29]
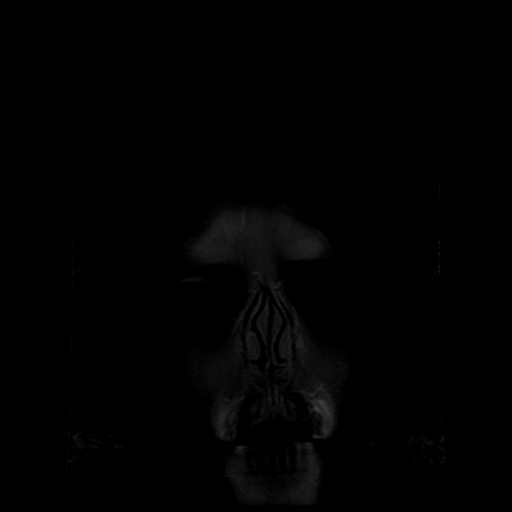

[Series 12: T1 post-contrast · sagittal · 5.0mm · 0.47mm/px · 2 of 24 slices shown (2 of 2)]
[im 1/24]
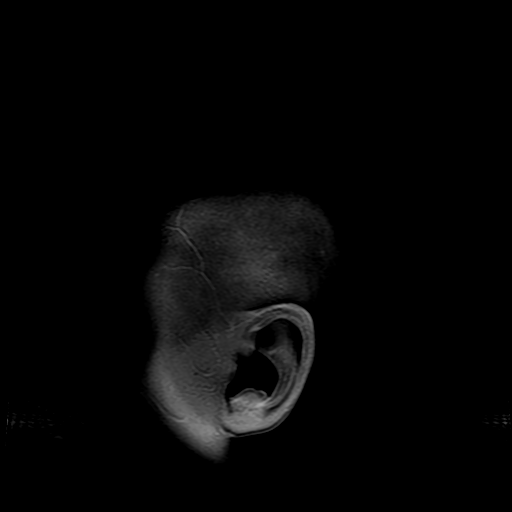
[im 24/24]
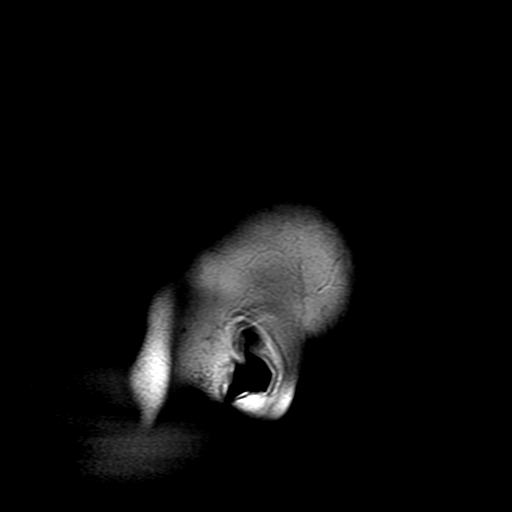

[Series 300: DWI · coronal · 5.0mm · 1.09mm/px · 3 of 36 slices shown (3 of 6)]
[im 1/36]
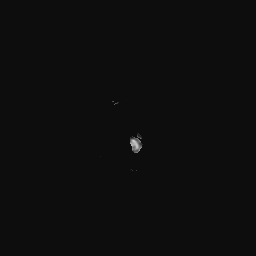
[im 18/36]
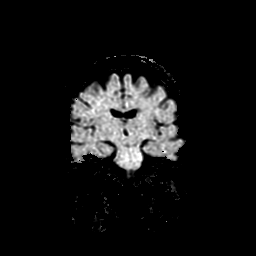
[im 36/36]
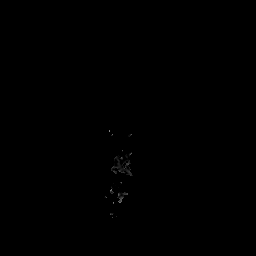

[Series 301: DWI · coronal · 5.0mm · 1.09mm/px · 3 of 36 slices shown (4 of 6)]
[im 1/36]
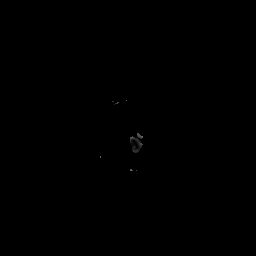
[im 18/36]
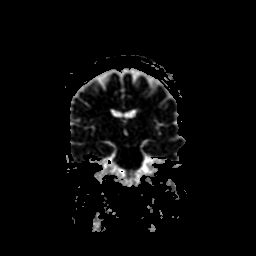
[im 36/36]
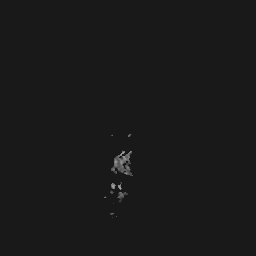

[Series 700: DWI · axial · 5.0mm · 1.09mm/px · z∈[-68,+82]mm · 3 of 31 slices shown (5 of 6)]
[im 1/31]
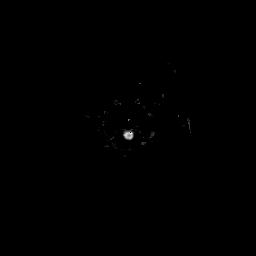
[im 16/31]
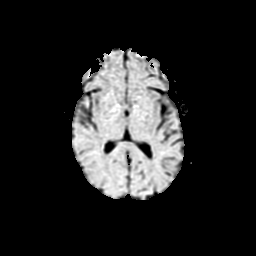
[im 31/31]
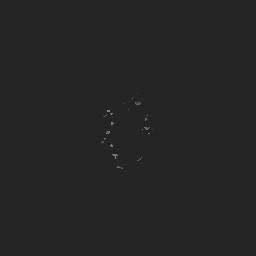

[Series 701: DWI · axial · 5.0mm · 1.09mm/px · z∈[-68,+82]mm · 3 of 31 slices shown (6 of 6)]
[im 1/31]
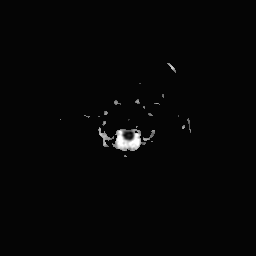
[im 16/31]
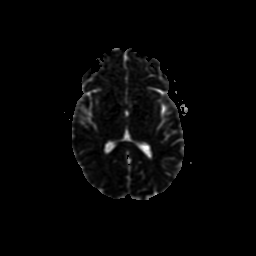
[im 31/31]
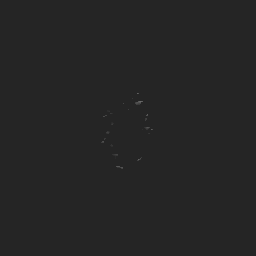

[36 of 48 positions shown; findings below may reference images not displayed]

FINDINGS: No acute infarct.

No intracranial hemorrhage.

No intracranial mass or bony destructive lesion to suggest presence
of intracranial metastatic disease.

Developmental venous anomaly left frontal lobe incidentally noted
and unchanged.

Minimal change in scattered punctate and patchy white matter type
changes which most likely are related to result of small vessel
disease. Other causes of white matter type changes felt to be
secondary less likely considerations.

Mild cerebellar tonsil ectopia unchanged.

No hydrocephalus.

Major intracranial vascular structures are patent.

Post lens replacement otherwise orbital structures unremarkable.
IMPRESSION: No evidence of intracranial metastatic disease.

Left frontal developmental venous anomaly incidentally noted and
unchanged.

Slight progression of small vessel disease type changes.

Mild cerebellar tonsil ectopia stable in appearance.

## 2015-10-14 IMAGING — MR MR LUMBAR SPINE WO/W CM
4 of 9 series · 19 of 48 positions shown · IV contrast (Yes)
Comparison: CT scan of 03/22/2014

CLINICAL DATA: Left leg pain. History of disc protrusion. History
of breast cancer. History of back surgery.

EXAM:
MRI LUMBAR SPINE WITHOUT AND WITH CONTRAST
TECHNIQUE: Multiplanar and multiecho pulse sequences of the lumbar spine were
obtained without and with intravenous contrast.
CONTRAST:  16 cc MultiHance

[Series 3: T1 · sagittal · 4.0mm · 0.55mm/px · 2 of 12 slices shown]
[im 1/12]
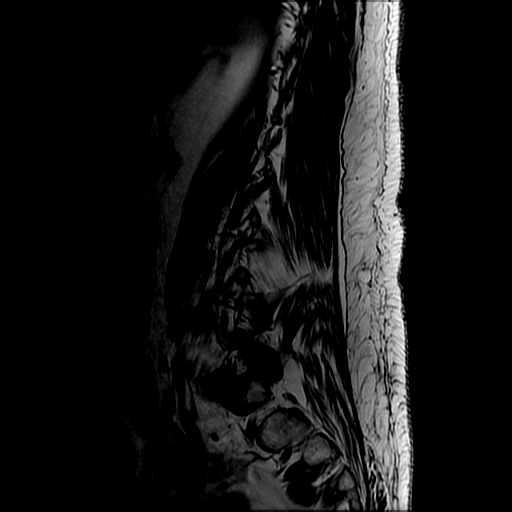
[im 12/12]
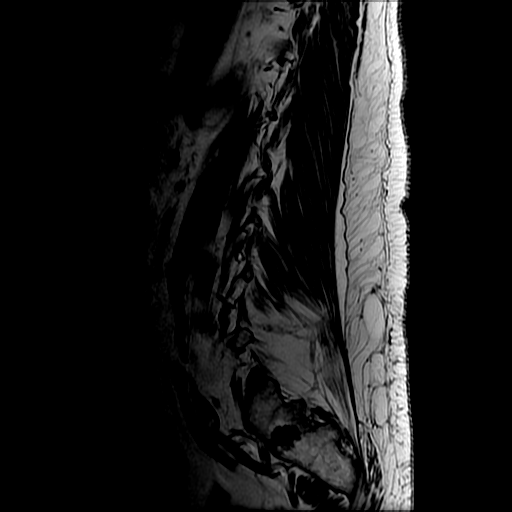

[Series 3: T2 post-contrast · sagittal · 4.0mm · 0.55mm/px · 2 of 12 slices shown]
[im 1/12]
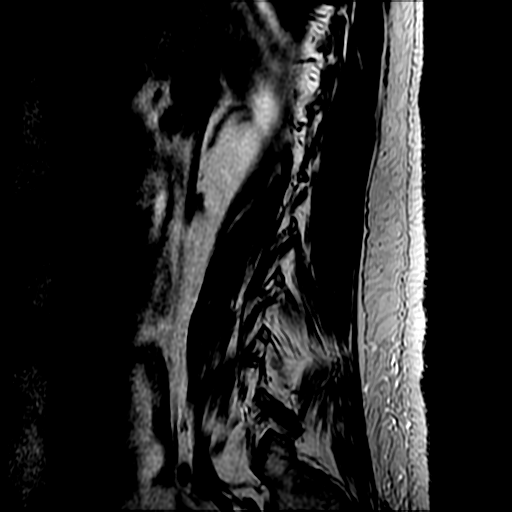
[im 12/12]
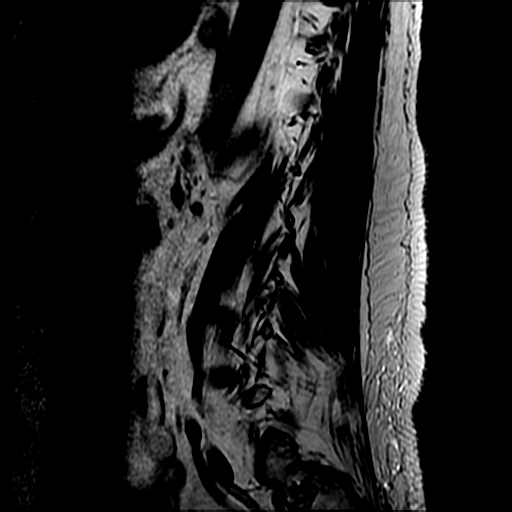

[Series 5: T2 · axial · 4.0mm · 0.39mm/px · z∈[-46,+170]mm · 8 of 40 slices shown (1 of 2)]
[im 1/40]
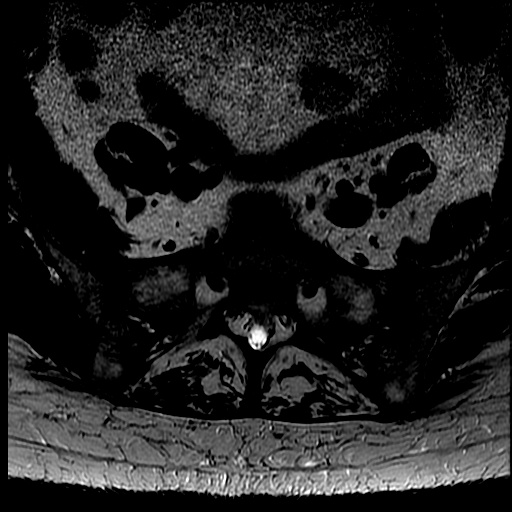
[im 6/40]
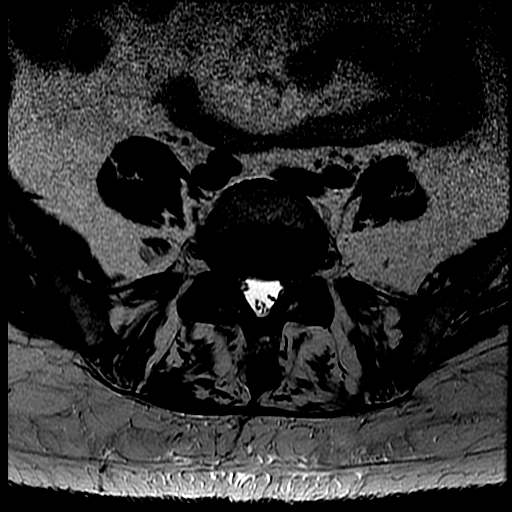
[im 12/40]
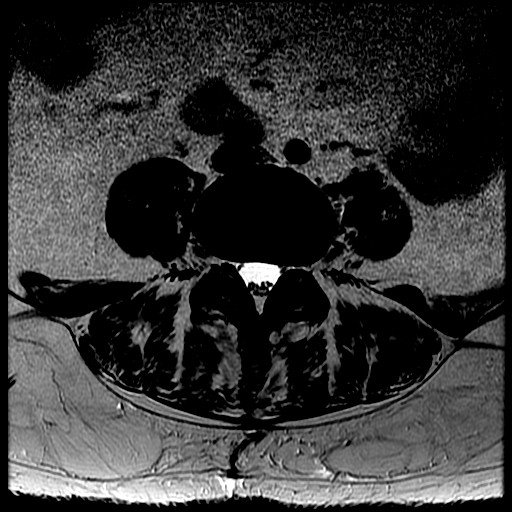
[im 17/40]
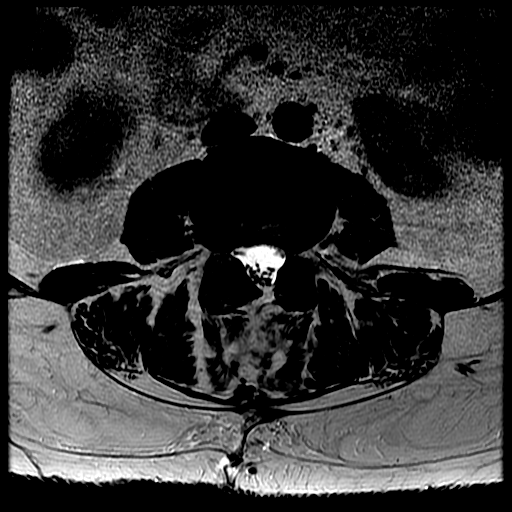
[im 23/40]
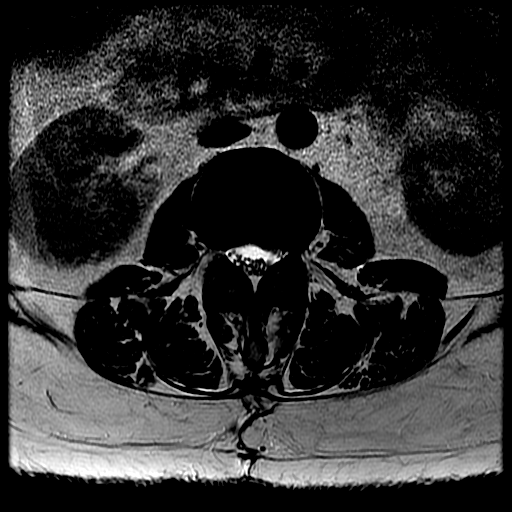
[im 28/40]
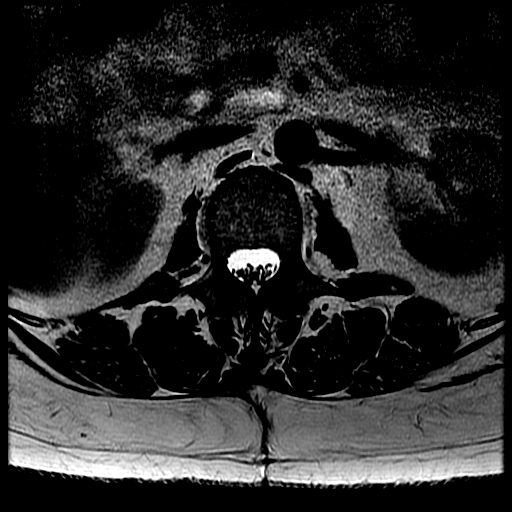
[im 34/40]
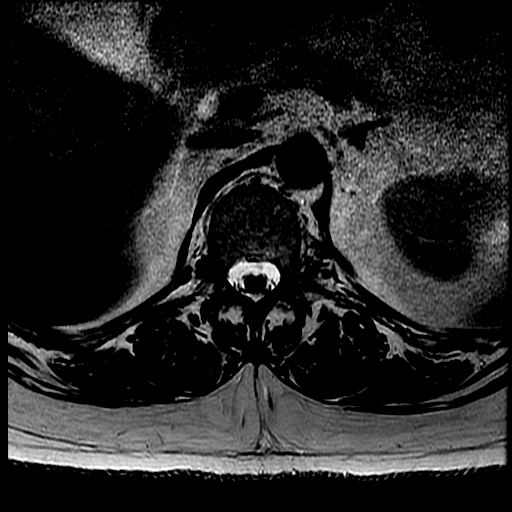
[im 40/40]
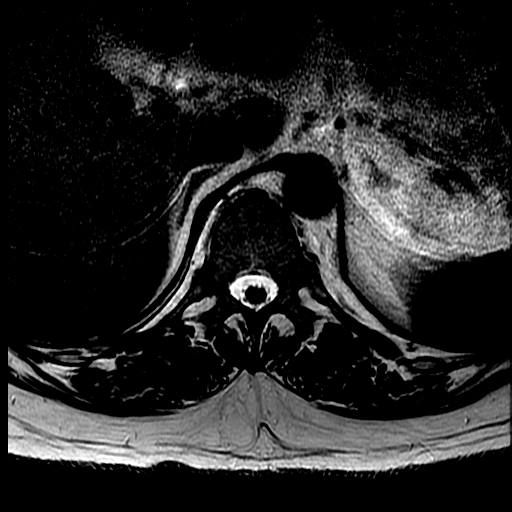

[Series 8: T2 · axial · 4.0mm · 0.39mm/px · z∈[-71,+100]mm · 7 of 37 slices shown (2 of 2)]
[im 1/37]
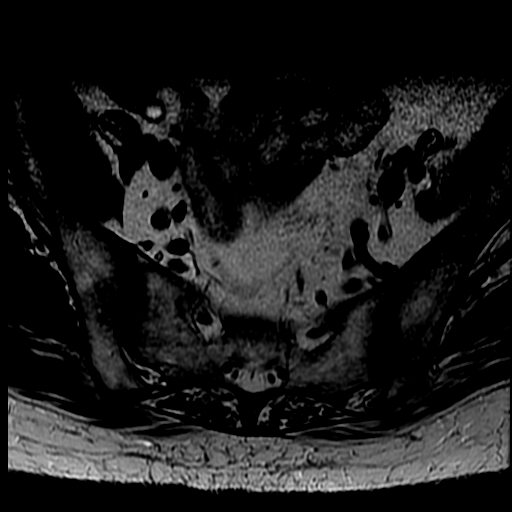
[im 6/37]
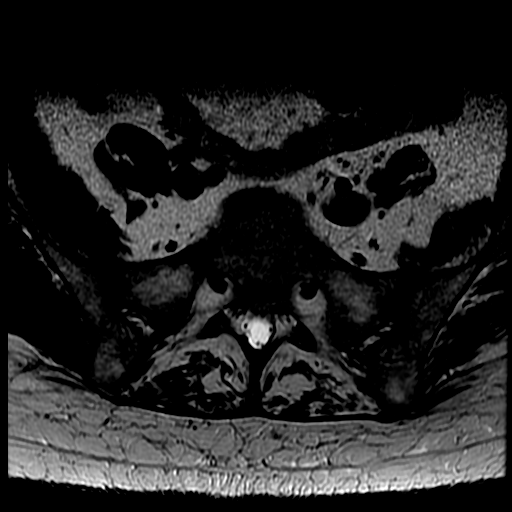
[im 11/37]
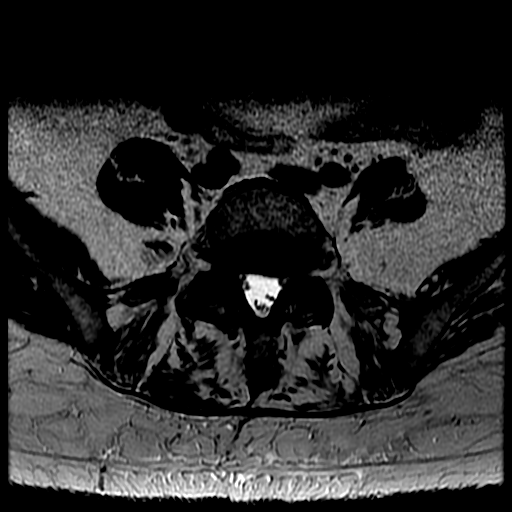
[im 16/37]
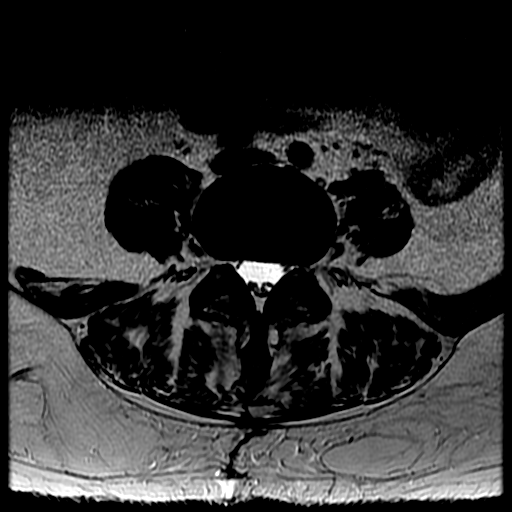
[im 21/37]
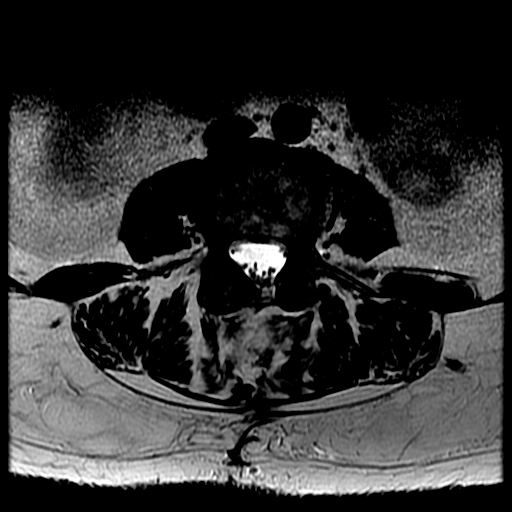
[im 26/37]
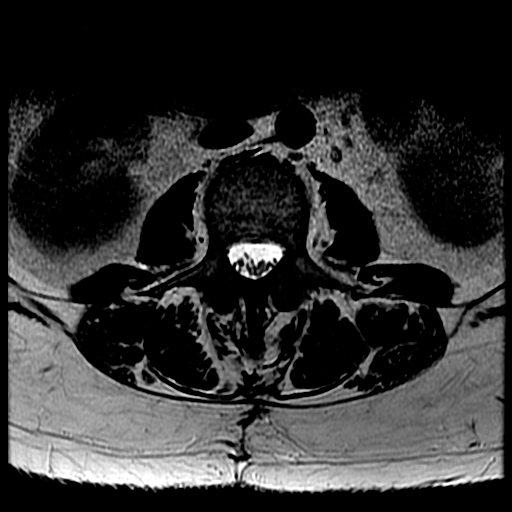
[im 31/37]
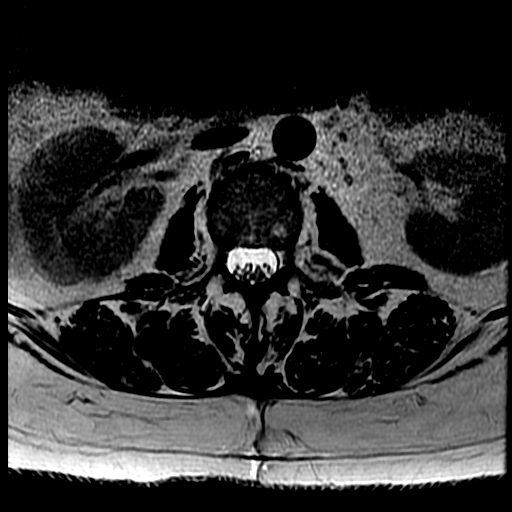

[19 of 48 positions shown; findings below may reference images not displayed]

FINDINGS: The lowest lumbar type non-rib-bearing vertebra is labeled as L5.
The conus medullaris appears normal. Conus level: Upper L2 level.

Enhancing T2 hyperintense left-sided lesion at the S2 level, 2.0 cm,
favors bony metastatic lesion.

Type 2 degenerative endplate findings noted at L3-4 with associated
loss of intervertebral disc height.

No vertebral subluxation is observed.

Additional findings at individual levels are as follows:

L1-2:  Unremarkable.

L2-3: No impingement. Mild disc bulge. Shallow left lateral recess
disc protrusion.

L3-4: No impingement. Posterior decompression. Disc bulge with
intervertebral and facet spurring.

L4-5: Borderline left subarticular lateral recess stenosis due to
disc bulge and facet arthropathy. Borderline right foraminal
stenosis due to same.

L5-S1: Mild bilateral subarticular lateral recess stenosis along
with mild right and borderline left foraminal stenosis due to facet
arthropathy, intervertebral spurring, and right inferior foraminal
and lateral recess disc protrusion.
IMPRESSION: 1. Lumbar spondylosis and degenerative disc disease, causing mild
impingement at L5-S1 and borderline impingement at L4-5, as noted
above.
2. Stable appearance of the 2 cm metastatic lesion in the left side
of the sacrum at the S2 level.

## 2015-10-27 ENCOUNTER — Telehealth: Payer: Self-pay | Admitting: Nurse Practitioner

## 2015-10-27 ENCOUNTER — Other Ambulatory Visit: Payer: Self-pay | Admitting: *Deleted

## 2015-10-27 DIAGNOSIS — C50411 Malignant neoplasm of upper-outer quadrant of right female breast: Secondary | ICD-10-CM

## 2015-10-27 NOTE — Telephone Encounter (Signed)
sch appt for June per 3/31 pof. Per nurse pt will get updated sch on 4/6 visit

## 2015-11-02 ENCOUNTER — Encounter: Payer: Self-pay | Admitting: Oncology

## 2015-11-02 ENCOUNTER — Other Ambulatory Visit (HOSPITAL_BASED_OUTPATIENT_CLINIC_OR_DEPARTMENT_OTHER): Payer: Medicare Other

## 2015-11-02 ENCOUNTER — Encounter: Payer: Self-pay | Admitting: *Deleted

## 2015-11-02 ENCOUNTER — Other Ambulatory Visit (HOSPITAL_COMMUNITY)
Admission: RE | Admit: 2015-11-02 | Discharge: 2015-11-02 | Disposition: A | Discharge status: Home / Self Care | Payer: Medicare Other | Source: Ambulatory Visit | Attending: Oncology | Admitting: Oncology

## 2015-11-02 ENCOUNTER — Ambulatory Visit (HOSPITAL_BASED_OUTPATIENT_CLINIC_OR_DEPARTMENT_OTHER): Payer: Medicare Other | Admitting: Oncology

## 2015-11-02 VITALS — BP 126/66 | HR 88 | Temp 97.7°F | Resp 18 | Ht 64.0 in | Wt 165.6 lb

## 2015-11-02 DIAGNOSIS — Z171 Estrogen receptor negative status [ER-]: Secondary | ICD-10-CM | POA: Diagnosis not present

## 2015-11-02 DIAGNOSIS — C50911 Malignant neoplasm of unspecified site of right female breast: Secondary | ICD-10-CM

## 2015-11-02 DIAGNOSIS — C773 Secondary and unspecified malignant neoplasm of axilla and upper limb lymph nodes: Secondary | ICD-10-CM | POA: Diagnosis not present

## 2015-11-02 DIAGNOSIS — Z006 Encounter for examination for normal comparison and control in clinical research program: Secondary | ICD-10-CM

## 2015-11-02 DIAGNOSIS — C7800 Secondary malignant neoplasm of unspecified lung: Principal | ICD-10-CM

## 2015-11-02 DIAGNOSIS — Z79811 Long term (current) use of aromatase inhibitors: Secondary | ICD-10-CM

## 2015-11-02 DIAGNOSIS — C7951 Secondary malignant neoplasm of bone: Secondary | ICD-10-CM

## 2015-11-02 DIAGNOSIS — C50411 Malignant neoplasm of upper-outer quadrant of right female breast: Secondary | ICD-10-CM

## 2015-11-02 LAB — CBC WITH DIFFERENTIAL/PLATELET
BASO%: 1.1 % (ref 0.0–2.0)
Basophils Absolute: 0.1 10*3/uL (ref 0.0–0.1)
EOS%: 1.7 % (ref 0.0–7.0)
Eosinophils Absolute: 0.1 10*3/uL (ref 0.0–0.5)
HEMATOCRIT: 40.9 % (ref 34.8–46.6)
HEMOGLOBIN: 13.5 g/dL (ref 11.6–15.9)
LYMPH%: 11.9 % — ABNORMAL LOW (ref 14.0–49.7)
MCH: 25.3 pg (ref 25.1–34.0)
MCHC: 32.9 g/dL (ref 31.5–36.0)
MCV: 76.7 fL — AB (ref 79.5–101.0)
MONO#: 0.8 10*3/uL (ref 0.1–0.9)
MONO%: 9.5 % (ref 0.0–14.0)
NEUT%: 75.8 % (ref 38.4–76.8)
NEUTROS ABS: 6.5 10*3/uL (ref 1.5–6.5)
PLATELETS: 298 10*3/uL (ref 145–400)
RBC: 5.34 10*6/uL (ref 3.70–5.45)
RDW: 15.4 % — ABNORMAL HIGH (ref 11.2–14.5)
WBC: 8.5 10*3/uL (ref 3.9–10.3)
lymph#: 1 10*3/uL (ref 0.9–3.3)

## 2015-11-02 LAB — COMPREHENSIVE METABOLIC PANEL
ALT: 27 U/L (ref 0–55)
ANION GAP: 11 meq/L (ref 3–11)
AST: 34 U/L (ref 5–34)
Albumin: 3.4 g/dL — ABNORMAL LOW (ref 3.5–5.0)
Alkaline Phosphatase: 213 U/L — ABNORMAL HIGH (ref 40–150)
BUN: 19.4 mg/dL (ref 7.0–26.0)
CALCIUM: 9.8 mg/dL (ref 8.4–10.4)
CHLORIDE: 110 meq/L — AB (ref 98–109)
CO2: 19 mEq/L — ABNORMAL LOW (ref 22–29)
Creatinine: 0.9 mg/dL (ref 0.6–1.1)
EGFR: 65 mL/min/{1.73_m2} — AB (ref >90)
Glucose: 142 mg/dl — ABNORMAL HIGH (ref 70–140)
POTASSIUM: 4 meq/L (ref 3.5–5.1)
SODIUM: 140 meq/L (ref 136–145)
Total Bilirubin: <0.3 mg/dL (ref 0.20–1.20)
Total Protein: 8 g/dL (ref 6.4–8.3)

## 2015-11-02 LAB — CK: Total CK: 49 U/L (ref 38–234)

## 2015-11-02 LAB — URIC ACID: URIC ACID, SERUM: 3.5 mg/dL (ref 2.6–7.4)

## 2015-11-02 LAB — PHOSPHORUS: PHOSPHORUS: 2.8 mg/dL (ref 2.5–4.6)

## 2015-11-02 LAB — BILIRUBIN, DIRECT

## 2015-11-02 MED ORDER — INV-EVEROLIMUS (RAD0001) 5MG TABLET NOVARTIS CRAD001Y24135: 2.0000 | ORAL_TABLET | Freq: Every day | ORAL | Status: DC

## 2015-11-02 NOTE — Progress Notes (Signed)
form recd from [facility]

## 2015-11-02 NOTE — Progress Notes (Signed)
[facility]  [phone] [phone]     ID: MYKAL KIRCHMAN DOB: February 28, 1951  MR#: [id number]  PJA#:[id number]  Patient Care Team: Darcus Austin, MD as PCP - General (Family Medicine) Jerline Pain, MD (Cardiology) Gaye Pollack, MD (Cardiothoracic Surgery) Chauncey Cruel, MD as Consulting Physician (Oncology) OTHER MD: Dr Erroll Luna- Merlene Laughter, DDS; Gaynelle Arabian MD  CHIEF COMPLAINT: metastatic breast cancer, on BOLERO study  CURRENT TREATMENT: letrozole + everolimus  BREAST CANCER HISTORY: From Dr. Collier [name]'s original intake note 04/13/2004:  "This woman has been in good health all of her life. She recently moved from [place] to work here.  She palpated a mass at about the 12 o'clock position in mid-July. She has not noticed any nipple retraction or skin changes.  She was seen by her primary care doctor who subsequently referred her for a mammogram.  Mammogram was performed on 03/01/04. This demonstrated a spiculated 2 cm mass at the 12 o'clock position in the right breast.  Upper outer quadrant of the left breast shows some distortion.   Physical exam at that time showed a firm, nontender nodule at the 12 o'clock position in the right breast, 5 cm from the nipple.  Ultrasound of this area showed a hypoechoic ill-defined mass, measuring 2.2 x 1.3 x 1.4 cm.  Physical exam of the left breast showed general vague thickening, upper outer quadrant of the left breast with a discrete palpable mass. The ultrasound performed showed a single hypoechoic ill-defined nodule at the 1 o'clock position, measuring 7 x 5 x 6 mm.  She had biopsies of both lesions on 03/02/03.  Needle core biopsy of the lesion on the right breast revealed invasive mammary carcinoma.  Needle core biopsy of the left breast showed a complex fibroadenoma.  Prognostic panel of the lesion on the right breast showed it to be ER positive at 73%, PR positive at 90% and proliferative index 9%, HER-2  was 1+.  Patient was referred to Dr. Annamaria Boots, who performed a simple mastectomy with sentinel lymph node evaluation on 03/22/04.  Final pathology showed this to be an invasive ductal carcinoma  with lobular features, measuring 2.2 cm, grade 2 of 3.  Margins negative for carcinoma.  Invasive ductal carcinoma was extended to involve deep dermis of the nipple.  Lymphovascular invasion was identified.  Total of 4 sentinel lymph nodes were evaluated.  Touch imprints at the time of the OR was felt to be negative.  Subsequent evaluation showed a 5 mm focus of metastatic carcinoma in one of the four lymph nodes on microscopic after sectioning.  There was extracapsular extension  of one lymph node as well. The remaining three lymph nodes were all negative."  The patient's subsequent history is detailed below.  INTERVAL HISTORY: Sadia returns today for follow up of her estrogen receptor positive breast cancer. Our research nurse, [name], facilitated the visit.[name] is currently on cycle 54 of the BOLERO-4 trial, receiving letrozole and everolimus. She had a mouth sore, possibly related to the everolimus, but since she has not had these for 3 years, perhaps is simply a viral sore. In any case it is resolving without any intervention. She has mild hot flashes. She complains of brittleness and dry skin. She has no other symptoms referrable either to her cancer or to its treatment.  REVIEW OF SYSTEMS: Maddux is doing fine as far as her hip is concerned and she is doing a lot more walking now. Her dog  who was 61-1/2 years old died from congestive heart failure and she has a new dog, a mixture of chihuahua an dashund called captain (goes by "Cappy"). She is looking for walks to take that. ASIDE FROM THIS ISSUE A DETAILED REVIEW OF SYSTEMS TODAY WAS NONCONTRIBUTORY  PAST MEDICAL HISTORY: Past Medical History  Diagnosis Date  . Shortness of breath   . Arthritis     left hip  . Osteopenia due to cancer therapy  09/09/2013  . Hypercholesteremia   . breast ca 2005    breast/chemo R mastectomy  . Metastasis to lung ([place]) dx'd 08/2011  . GERD (gastroesophageal reflux disease)   . Palpitations   . Asthma     PAST SURGICAL HISTORY: Past Surgical History  Procedure Laterality Date  . Spine surgery  1996  . Breast surgery  2005    right  . Video bronchoscopy  09/11/2011    Procedure: VIDEO BRONCHOSCOPY;  Surgeon: Gaye Pollack, MD;  Location: [facility];  Service: Thoracic;  Laterality: N/A;  . Chest tube insertion  09/11/2011    Procedure: INSERTION PLEURAL DRAINAGE CATHETER;  Surgeon: Gaye Pollack, MD;  Location: [facility];  Service: Thoracic;  Laterality: Right;  . Pericardial window  09/11/2011    Procedure: PERICARDIAL WINDOW;  Surgeon: Gaye Pollack, MD;  Location: [facility];  Service: Thoracic;  Laterality: N/A;  . Removal of pleural drainage catheter  12/19/2011    Procedure: REMOVAL OF PLEURAL DRAINAGE CATHETER;  Surgeon: Gaye Pollack, MD;  Location: [facility];  Service: Thoracic;  Laterality: Right;  TO BE DONE IN MINOR ROOM, SHORT STAY  . Cataract extraction w/phaco Left 01/18/2014    Procedure: CATARACT EXTRACTION PHACO AND INTRAOCULAR LENS PLACEMENT (IOC);  Surgeon: Elta Guadeloupe T. Gershon Crane, MD;  Location: [facility];  Service: Ophthalmology;  Laterality: Left;  CDE 15.79  . Cataract extraction w/phaco Right 02/08/2014    Procedure: CATARACT EXTRACTION PHACO AND INTRAOCULAR LENS PLACEMENT (IOC);  Surgeon: Elta Guadeloupe T. Gershon Crane, MD;  Location: [facility];  Service: Ophthalmology;  Laterality: Right;  CDE 4.41  . Total hip arthroplasty Left 06/07/2015    Procedure: LEFT TOTAL HIP ARTHROPLASTY ANTERIOR APPROACH;  Surgeon: Gaynelle Arabian, MD;  Location: [facility];  Service: Orthopedics;  Laterality: Left;    FAMILY HISTORY Family History  Problem Relation Age of Onset  . Cancer Mother     breast  . Heart disease Father   . Anesthesia problems Neg Hx    the patient's mother was diagnosed with breast cancer at age 65, she died age  6 from congestive heart failure..The patient's father died from heart disease at age 35.  She has one sister alive & well.  Two brothers alive & well, one with diabetes. The patient's sister was also diagnosed with breast cancer, and was tested for the BRCA gene, and was negative. The patient herself has not been tested. There is no history of ovarian cancer in the family  GYNECOLOGIC HISTORY:  No LMP recorded. Patient is postmenopausal. Menarche age 49, the patient is GX P0. She stopped having periods with chemotherapy in 2005. 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 She was a Archivist. She is single, lives alone with her Shitzu-poodle Sammie.. Family is all in the [place] area.     ADVANCED DIRECTIVES: Not in place. At the 02/23/2014 visit the patient was given the appropriate documents  to complete and notarize at her discretion. She tells me she is planning to name her sister, Billie Ruddy, as healthcare power of attorney. [name] can be reached at [phone]   HEALTH MAINTENANCE: Social History  Substance Use Topics  . Smoking status: Former Smoker -- 1.50 packs/day for 30 years    Types: Cigarettes    Quit date: 09/10/2007  . Smokeless tobacco: Not on file  . Alcohol Use: No     Colonoscopy:  PAP:  Bone density: 08/14/2015, T score -1.6  Lipid panel:  Allergies  Allergen Reactions  . Aspirin     REACTION: upset stomach  . Latex Other (See Comments)    Blistering and skin peels off  . Nsaids Nausea And Vomiting    Extreme nausea and vomiting    Current Outpatient Prescriptions  Medication Sig Dispense Refill  . cholecalciferol (VITAMIN D) 1000 units tablet Take 1,000 Units by mouth 2 (two) times daily.    Marland Kitchen gemfibrozil (LOPID) 600 MG tablet Take 1 tablet (600 mg total) by mouth 2 (two) times daily before a meal. 180 tablet 12  . Investigational  everolimus (RAD001) 5 MG tablet Novartis [id number] Take 2 tablets by mouth daily. Take with a glass of water. 70 tablet 0  . Investigational everolimus (RAD001) 5 MG tablet Novartis [id number] Take 2 tablets by mouth daily. Take with a glass of water. 70 tablet 0  . letrozole (FEMARA) 2.5 MG tablet Take 1 tablet (2.5 mg total) by mouth daily. 30 tablet 0  . loperamide (IMODIUM) 2 MG capsule Take 2 mg by mouth 4 (four) times daily as needed for diarrhea or loose stools. Reported on 07/13/2015    . metoprolol tartrate (LOPRESSOR) 25 MG tablet Take 0.5 tablets (12.5 mg total) by mouth daily. 30 tablet 4  . omeprazole (PRILOSEC) 40 MG capsule Take 1 capsule (40 mg total) by mouth daily as needed. 90 capsule 3  . ondansetron (ZOFRAN) 8 MG tablet Take 1 tablet (8 mg total) by mouth 2 (two) times daily as needed (Nausea or vomiting). (Patient not taking: Reported on 09/20/2015) 30 tablet 1  . prochlorperazine (COMPAZINE) 25 MG suppository Place 1 suppository (25 mg total) rectally every 12 (twelve) hours as needed for nausea. (Patient not taking: Reported on 09/20/2015) 12 suppository 3   No current facility-administered medications for this visit.    OBJECTIVE: Middle-aged white woman Who appears well Filed Vitals:   11/02/15 0908 11/02/15 0909  BP: 121/69 126/66  Pulse: 89 88  Temp:    Resp:       Body mass index is 28.41 kg/(m^2).    ECOG FS:0 - Asymptomatic  Sclerae unicteric, pupils round and equal Oropharynx clear and moist-- no active sores ntoed No cervical or supraclavicular adenopathy Lungs no rales or rhonchi Heart regular rate and rhythm Abd soft, nontender, positive bowel sounds MSK no focal spinal tenderness, no upper extremity lymphedema Neuro: nonfocal, well oriented, appropriate affect Breasts: Deferred     LAB RESULTS:  CMP     Component Value Date/Time   NA 139 10/05/2015 0910   NA 137 09/20/2015 1257   K 4.2 10/05/2015 0910   K 4.3 09/20/2015 1257   CL 106  09/20/2015 1257   CL 109* 12/31/2012 0940   CO2 19* 10/05/2015 0910   CO2 23 09/20/2015 1257   GLUCOSE 139 10/05/2015 0910   GLUCOSE 265* 09/20/2015 1257   GLUCOSE 127* 12/31/2012 0940   BUN 22.4 10/05/2015 0910   BUN 20 09/20/2015 1257  CREATININE 1.0 10/05/2015 0910   CREATININE 0.80 09/20/2015 1257   CALCIUM 10.1 10/05/2015 0910   CALCIUM 9.3 09/20/2015 1257   PROT 8.0 10/05/2015 0910   PROT 7.4 05/29/2015 1415   ALBUMIN 3.5 10/05/2015 0910   ALBUMIN 3.6 05/29/2015 1415   AST 39* 10/05/2015 0910   AST 51* 05/29/2015 1415   ALT 29 10/05/2015 0910   ALT 39 05/29/2015 1415   ALKPHOS 237* 10/05/2015 0910   ALKPHOS 203* 05/29/2015 1415   BILITOT 0.31 10/05/2015 0910   BILITOT 0.3 05/29/2015 1415   GFRNONAA >60 09/20/2015 1257   GFRAA >60 09/20/2015 1257    I No results found for: SPEP  Lab Results  Component Value Date   WBC 8.5 11/02/2015   NEUTROABS 6.5 11/02/2015   HGB 13.5 11/02/2015   HCT 40.9 11/02/2015   MCV 76.7* 11/02/2015   PLT 298 11/02/2015      Chemistry      Component Value Date/Time   NA 139 10/05/2015 0910   NA 137 09/20/2015 1257   K 4.2 10/05/2015 0910   K 4.3 09/20/2015 1257   CL 106 09/20/2015 1257   CL 109* 12/31/2012 0940   CO2 19* 10/05/2015 0910   CO2 23 09/20/2015 1257   BUN 22.4 10/05/2015 0910   BUN 20 09/20/2015 1257   CREATININE 1.0 10/05/2015 0910   CREATININE 0.80 09/20/2015 1257      Component Value Date/Time   CALCIUM 10.1 10/05/2015 0910   CALCIUM 9.3 09/20/2015 1257   ALKPHOS 237* 10/05/2015 0910   ALKPHOS 203* 05/29/2015 1415   AST 39* 10/05/2015 0910   AST 51* 05/29/2015 1415   ALT 29 10/05/2015 0910   ALT 39 05/29/2015 1415   BILITOT 0.31 10/05/2015 0910   BILITOT 0.3 05/29/2015 1415       Lab Results  Component Value Date   LABCA2 58* 09/14/2012    No components found for: [id number]  No results for input(s): INR in the last 168 hours.  Urinalysis    Component Value Date/Time   COLORURINE YELLOW  09/20/2015 1432   APPEARANCEUR HAZY* 09/20/2015 1432   LABSPEC >1.030* 09/20/2015 1432   LABSPEC 1.030 07/13/2015 0841   PHURINE 5.5 09/20/2015 1432   PHURINE 6.0 07/13/2015 0841   GLUCOSEU 500* 09/20/2015 1432   GLUCOSEU Negative 07/13/2015 0841   HGBUR MODERATE* 09/20/2015 1432   HGBUR Large 07/13/2015 0841   BILIRUBINUR NEGATIVE 09/20/2015 1432   BILIRUBINUR Negative 07/13/2015 0841   KETONESUR NEGATIVE 09/20/2015 1432   KETONESUR Negative 07/13/2015 0841   PROTEINUR 30* 09/20/2015 1432   PROTEINUR 300 07/13/2015 0841   UROBILINOGEN 0.2 07/13/2015 0841   UROBILINOGEN 1.0 05/29/2015 1420   NITRITE NEGATIVE 09/20/2015 1432   NITRITE Negative 07/13/2015 0841   LEUKOCYTESUR NEGATIVE 09/20/2015 1432   LEUKOCYTESUR Negative 07/13/2015 0841    STUDIES: Ct Chest W Contrast  10/03/2015  CLINICAL DATA:  Metastatic breast cancer diagnosed 08/2003 and 08/2011. EXAM: CT CHEST, ABDOMEN, AND PELVIS WITH CONTRAST TECHNIQUE: Multidetector CT imaging of the chest, abdomen and pelvis was performed following the standard protocol during bolus administration of intravenous contrast. CONTRAST:  153m OMNIPAQUE IOHEXOL 300 MG/ML  SOLN COMPARISON:  Chest CT of 09/20/2015. Chest item pelvic CTs of 08/08/2015. FINDINGS: RECIST 1.0 Target Lesions: 1. Right lower lobe 5 mm pulmonary nodule on image 37/series 4, similar. 2. 6 mm subcarinal node on image 28/series 2, similar. Non-target Lesions: 1. Right-sided pleural thickening and trace fluid, similar. CT CHEST FINDINGS Mediastinum/Nodes: No supraclavicular adenopathy.  right axillary node dissection with mastectomy. No axillary adenopathy. Normal heart size, without pericardial effusion. Mild lipomatous hypertrophy of the interatrial septum. No central pulmonary embolism, on this non-dedicated study. No mediastinal or hilar adenopathy. No internal mammary adenopathy. Lungs/Pleura: Trace right-sided pleural thickening, similar. Minimal right pleural fluid inferiorly  and medially, similar. Probable secretions in the dependent trachea. Subpleural right lower lobe 5 mm nodule is unchanged on image 37/ series 4. Clear left lung. Musculoskeletal: Sclerosis involving the posterior medial left tenth rib is unchanged. There is also subtle sclerosis involving the lateral aspect of the right second rib. CT ABDOMEN PELVIS FINDINGS Hepatobiliary: Moderate hepatic steatosis with sparing adjacent the gallbladder. Vague enhancement in the right lobe of the liver measures 11 mm on image 52/series 2 and is similar, favoring a perfusion anomaly. This is present back to 07/12/2014 at least. Soft tissue density at the gallbladder fundus measures 8 mm on image 68/series 2 and is not readily apparent on the prior. upper normal sized common duct, 7 mm. Pancreas: Mild pancreatic atrophy, without duct dilatation. Periampullary duodenal diverticula noted. Spleen: Normal in size, without focal abnormality. Adrenals/Urinary Tract: Normal right adrenal gland. A left adrenal nodule is similar at 10 mm. Degraded evaluation of the pelvis, secondary to beam hardening artifact from left hip arthroplasty. Grossly normal urinary bladder. Stomach/Bowel: Normal stomach, without wall thickening. Normal colon, appendix, and terminal ileum. Otherwise normal small bowel. Vascular/Lymphatic: Aortic and branch vessel atherosclerosis. No abdominal and no gross pelvic adenopathy. Reproductive: Normal uterus and adnexa. Other: No significant free fluid. No evidence of omental or peritoneal disease. Musculoskeletal: Left hip arthroplasty. Similar sclerotic lesions involving the left iliac wing and left side of the sacrum. IMPRESSION: 1. Similar sclerotic lesions, likely related to treated osseous metastasis. 2. No findings to suggest extra osseous metastatic disease. 3. Similar tiny right lower lobe pulmonary nodule. 4. Hepatic steatosis. 5. Similar left adrenal nodule, likely an adenoma. 6. Right hepatic lobe focus of hyper  enhancement is similar back to 2015 and likely a perfusion anomaly. 7. Suspicion of gallbladder adenomyomatosis. Recommend attention on follow-up. Electronically Signed   By: Abigail Miyamoto M.D.   On: 10/03/2015 14:00   Ct Abdomen Pelvis W Contrast  10/03/2015  CLINICAL DATA:  Metastatic breast cancer diagnosed 08/2003 and 08/2011. EXAM: CT CHEST, ABDOMEN, AND PELVIS WITH CONTRAST TECHNIQUE: Multidetector CT imaging of the chest, abdomen and pelvis was performed following the standard protocol during bolus administration of intravenous contrast. CONTRAST:  148m OMNIPAQUE IOHEXOL 300 MG/ML  SOLN COMPARISON:  Chest CT of 09/20/2015. Chest item pelvic CTs of 08/08/2015. FINDINGS: RECIST 1.0 Target Lesions: 1. Right lower lobe 5 mm pulmonary nodule on image 37/series 4, similar. 2. 6 mm subcarinal node on image 28/series 2, similar. Non-target Lesions: 1. Right-sided pleural thickening and trace fluid, similar. CT CHEST FINDINGS Mediastinum/Nodes: No supraclavicular adenopathy. right axillary node dissection with mastectomy. No axillary adenopathy. Normal heart size, without pericardial effusion. Mild lipomatous hypertrophy of the interatrial septum. No central pulmonary embolism, on this non-dedicated study. No mediastinal or hilar adenopathy. No internal mammary adenopathy. Lungs/Pleura: Trace right-sided pleural thickening, similar. Minimal right pleural fluid inferiorly and medially, similar. Probable secretions in the dependent trachea. Subpleural right lower lobe 5 mm nodule is unchanged on image 37/ series 4. Clear left lung. Musculoskeletal: Sclerosis involving the posterior medial left tenth rib is unchanged. There is also subtle sclerosis involving the lateral aspect of the right second rib. CT ABDOMEN PELVIS FINDINGS Hepatobiliary: Moderate hepatic steatosis with sparing adjacent the  gallbladder. Vague enhancement in the right lobe of the liver measures 11 mm on image 52/series 2 and is similar, favoring a  perfusion anomaly. This is present back to 07/12/2014 at least. Soft tissue density at the gallbladder fundus measures 8 mm on image 68/series 2 and is not readily apparent on the prior. upper normal sized common duct, 7 mm. Pancreas: Mild pancreatic atrophy, without duct dilatation. Periampullary duodenal diverticula noted. Spleen: Normal in size, without focal abnormality. Adrenals/Urinary Tract: Normal right adrenal gland. A left adrenal nodule is similar at 10 mm. Degraded evaluation of the pelvis, secondary to beam hardening artifact from left hip arthroplasty. Grossly normal urinary bladder. Stomach/Bowel: Normal stomach, without wall thickening. Normal colon, appendix, and terminal ileum. Otherwise normal small bowel. Vascular/Lymphatic: Aortic and branch vessel atherosclerosis. No abdominal and no gross pelvic adenopathy. Reproductive: Normal uterus and adnexa. Other: No significant free fluid. No evidence of omental or peritoneal disease. Musculoskeletal: Left hip arthroplasty. Similar sclerotic lesions involving the left iliac wing and left side of the sacrum. IMPRESSION: 1. Similar sclerotic lesions, likely related to treated osseous metastasis. 2. No findings to suggest extra osseous metastatic disease. 3. Similar tiny right lower lobe pulmonary nodule. 4. Hepatic steatosis. 5. Similar left adrenal nodule, likely an adenoma. 6. Right hepatic lobe focus of hyper enhancement is similar back to 2015 and likely a perfusion anomaly. 7. Suspicion of gallbladder adenomyomatosis. Recommend attention on follow-up. Electronically Signed   By: Abigail Miyamoto M.D.   On: 10/03/2015 14:00      ASSESSMENT: 65 y.o. [place], [place] woman with stage IV breast cancer, on BOLERO-4 trial  (1) status post right mastectomy and sentinel lymph node sampling 03/22/2004 for a pT2 pN1, stage IIB invasive ductal carcinoma with lobular features, grade 2, estrogen receptor and progesterone receptor positive, HER-2 negative, with an  MIB-1 of 9% ([id number] and [id number])  (2) addition all right axillary lymph node sampling 05/07/2004 showed 2 benign lymph nodes (4 lymph nodes previously removed, so total was one positive lymph node out of 6; [id number])  (3) the patient was evaluated by radiation oncology; no postmastectomy radiation recommended  (4) adjuvant chemotherapy with dose dense doxorubicin and cyclophosphamide x4 cycles (first cycle delayed one week) followed by dose dense paclitaxel x4 was completed 09/18/2004  (5) tamoxifen started March 2006, discontinued 2009  METASTATIC DISEASE: (6) presenting with a large pericardial effusion, large right pleural effusion and possible right middle lobe bronchial obstruction February 2013, status post pericardial window placement, fiberoptic bronchoscopy and right Pleurx placement 09/11/2011, with biopsy of the bronchus intermedius and pericardium positive for metastatic breast cancer, estrogen receptor 91% positive with moderate staining intensity, progesterone receptor 100% positive with strong staining intensity, with an MIB-1 of 35%, and no HER-2 amplification, the signals ratio being 1.37 ([id number])  (7) enrolled in BOLERO-4 trial 10/10/2011, receiving letrozole and everolimus  (a) two small areas of enhancement in the cerebellum noted by brain MRI 10/03/2011 were no longer apparent on repeat MRI 08/21/2012-- most recent brain MRI 04/01/2014 showed no evidence of intracranial metastatic disease  (b) sclerotic lesions in left iliac bone and sacrum have not been biopsied; stable; plan is to start zolendronate after patient updates her dental care (extraction planned)  (c) RLL lung nodule, stable (rescanned 07/12/2014 and 08/11/2014) R hilar and subcarinal lymph nodes: stable  (9) additional problems:  (a) hepatic steatosis  (b) COPD/ emphysema/ asthma  (c) advanced L hip osteoarthritis, status post left total hip replacement 06/07/2015  (d) aortoiliac atherosclerosis  (e)  dental evaluation pending w possible dental extractions  (f) possible thalassemia trait  (10) Bone density concerns: DEXA scan 08/11/2013 was normal   PLAN: Aletha continues to do quite well as far as her metastatic breast cancer is concerned. She is tolerating her medications with the same side effects as before, namely dry skin, some hot flashes, and brittle nails. She really doesn't want any intervention for any of those problems.  She is taking more walks now that she has a puppy and I gave her information on the [facility] program. I think she would be to refill for that.   Otherwise she will return to see [place] in May. We have obtained permission from the study chair to lengthen her follow-up interval and stopped getting as many CT scans. After her May visit hopefully we can loosen her leash and she can return to see me in August with a CT scan at that time.  Camreigh has a good understanding of this plan. She knows to call for any problems that may develop before then. Chauncey Cruel, MD   11/02/2015 9:15 AM

## 2015-11-02 NOTE — Progress Notes (Signed)
11/02/15 at 10:01am -[employer] [facility]/ cycle 54, day 1 study notes- The pt was into the cancer center this am for her fasting labs and cycle 54, day 1 assessments. The pt confirmed that she was fasting upon arrival to the cancer center for her labs. The pt was seen and examined today by Dr. Jana Hakim. He reviewed the pt's labs, and he felt that her abnormal lab values were "not clinically significant". The pt's vitals and weight were stable, and her mean BP was obtained for study purposes.  Dr. Jana Hakim informed the pt that there will be a forthcoming amendment that will allow her to be seen less frequently with fewer scans since the pt is so stable.  The pt was relieved to hear the study news.  The pt was told that it may take a month or longer to receive the amendment and new consent.  Dr. Jana Hakim and the pt were in agreement to proceed with her next scans in May.  The pt states she has had a "rough" month because she recently lost her dog, Sammie.  She had him for 14 years, and he died in her lap. She said that she was simply "lost" without a pet.  She recently adopted a puppy from the shelter.  She said that Cappie is keeping her quite busy now.  She is taking her new dog on long walks.  She is performing all of her usual activities.  ECOG=0. She reports the following AE's as ongoing: hot flashes, memory impairment, fatigue, nail changes, dry skin, and back pain. The left knee pain has resolved. She said that she developed a lip ulcer on 10/30/15 from all of the stress.  She said that it has resolved.  She reports the following concomitant medications as ongoing: Lopressor, compazine prn, immodium prn, lopid, vitamin D, Tylenol arthritis prn, Prilosec, and ibuprofen. She denied any new medications.  The pt returned her cycle 53 everolimus study drug boxes. She confirmed that she took her everolimus (10 mg) every day along with letrozole (2.5 mg) from 10/05/15 - 11/01/15 (28 days). The pt's everolimus drug  boxes had 0 remaining (unopened) blister packs. The pt was dispensed 56 pills - 56 taken =0 pills. Therefore, the pt is 100% compliant with her study drug, everolimus, for cycle 53. The pt's everolimus was taken to the pharmacy for the drug accountability check and storage. The pt was dispensed her cycle 54 everolimus study drug boxes for self administration. The pt is aware of her May and June appointments.The pt's lipid panel is pending. Brion Aliment RN, BSN, CCRP Clinical Research Nurse 11/02/2015 10:08 AM   11/13/15 at 11:00am - The pt's lipid panel is stable. Brion Aliment RN, BSN, CCRP Clinical Research Nurse 11/13/2015 5:29 PM

## 2015-11-03 LAB — LIPID PANEL
CHOLESTEROL TOTAL: 252 mg/dL — AB (ref 100–199)
Chol/HDL Ratio: 4.9 ratio units — ABNORMAL HIGH (ref 0.0–4.4)
HDL: 51 mg/dL (ref >39)
LDL Calculated: 151 mg/dL — ABNORMAL HIGH (ref 0–99)
Triglycerides: 249 mg/dL — ABNORMAL HIGH (ref 0–149)
VLDL CHOLESTEROL CAL: 50 mg/dL — AB (ref 5–40)

## 2015-11-06 ENCOUNTER — Encounter: Payer: Self-pay | Admitting: Oncology

## 2015-11-06 NOTE — Progress Notes (Signed)
form recd from [facility].left for dr Jana Hakim to sign

## 2015-11-07 ENCOUNTER — Encounter: Payer: Self-pay | Admitting: Oncology

## 2015-11-07 NOTE — Progress Notes (Signed)
form recd from [facility].left for dr Jana Hakim to sign--faxed and mailed copy to patient for her recrds-copy to medical records

## 2015-11-09 DIAGNOSIS — Z96642 Presence of left artificial hip joint: Secondary | ICD-10-CM | POA: Diagnosis not present

## 2015-11-09 DIAGNOSIS — Z471 Aftercare following joint replacement surgery: Secondary | ICD-10-CM | POA: Diagnosis not present

## 2015-11-28 ENCOUNTER — Encounter: Payer: Self-pay | Admitting: Oncology

## 2015-11-28 ENCOUNTER — Encounter: Payer: Self-pay | Admitting: *Deleted

## 2015-11-28 ENCOUNTER — Encounter (HOSPITAL_COMMUNITY): Payer: Self-pay

## 2015-11-28 ENCOUNTER — Ambulatory Visit (HOSPITAL_COMMUNITY)
Admission: RE | Admit: 2015-11-28 | Discharge: 2015-11-28 | Disposition: A | Discharge status: Home / Self Care | Payer: Medicare Other | Source: Ambulatory Visit | Attending: Oncology | Admitting: Oncology

## 2015-11-28 DIAGNOSIS — E278 Other specified disorders of adrenal gland: Secondary | ICD-10-CM | POA: Insufficient documentation

## 2015-11-28 DIAGNOSIS — C50911 Malignant neoplasm of unspecified site of right female breast: Secondary | ICD-10-CM | POA: Insufficient documentation

## 2015-11-28 DIAGNOSIS — C7800 Secondary malignant neoplasm of unspecified lung: Principal | ICD-10-CM

## 2015-11-28 DIAGNOSIS — C7951 Secondary malignant neoplasm of bone: Secondary | ICD-10-CM | POA: Insufficient documentation

## 2015-11-28 DIAGNOSIS — K760 Fatty (change of) liver, not elsewhere classified: Secondary | ICD-10-CM | POA: Diagnosis not present

## 2015-11-28 DIAGNOSIS — K829 Disease of gallbladder, unspecified: Secondary | ICD-10-CM | POA: Diagnosis not present

## 2015-11-28 MED ORDER — IOPAMIDOL (ISOVUE-300) INJECTION 61%
100.0000 mL | Freq: Once | INTRAVENOUS | Status: AC | PRN
  Administered 2015-11-28: 100 mL via INTRAVENOUS

## 2015-11-28 MED ORDER — DIATRIZOATE MEGLUMINE & SODIUM 66-10 % PO SOLN
30.0000 mL | Freq: Once | ORAL | Status: AC
  Administered 2015-11-28: 30 mL via ORAL

## 2015-11-28 NOTE — Progress Notes (Signed)
11/28/15 at 11:51am- [employer] - reconsent discussion with the pt.  The research nurse met with the pt today while the pt was in the radiology department to discuss the new consent form. The research nurse informed the pt about the new consent and the Amendment 5 changes to the protocol.  The pt was also informed that Dr. Lindi Adie will be transitioning to be the PI of the study from Dr. Beryle Beams.  The pt was reassured that Dr. Jana Hakim will remain her treating physician.  The pt was explained that there is a new 3 year extension phase of the trial.  The pt was told that her physician will see her every 3 months (12 weeks) and that her imaging and labs will be at her physician's discretion.  The pt was explained that the study does not offer the chance to be treated with everolimus and exemestane anymore if she were to progress on everolimus and letrozole.  The pt knows that if she is no longer benefitting from her treatment (everlimus and letrozole) then she will need to exit the study.  The pt was given a copy of the new consent form and the research nurse went over page by page with the pt all of the new changes to the consent.  The pt agreed to take the consent home and read over it again.  The pt will return on Thursday, 5/4 to see Dr. Jana Hakim for her cycle 55 assessments.  The pt was thanked for her continued support of this trial.   Brion Aliment RN, BSN, CCRP Clinical Research Nurse 11/28/2015 12:04 PM   11/30/15 at 10:19am - Cycle 55 study notes/ reconsent note- The pt was into the cancer center this am for her fasting labs and cycle 55, day 1 assessments. The pt confirmed that she was fasting upon arrival to the cancer center for her labs.  The pt said that she had read over the new consent form, and she had no questions/concerns.  The pt signed the new consent form at 8:50am.  The pt was given a copy of her signed consent form for her records.  The pt was seen and examined today by Dr.  Virgie Dad NP, Gentry Fitz. She reviewed the pt's labs, and she felt that her abnormal lab values were "not clinically significant". The pt's vitals and weight were stable, and her mean BP was obtained for study purposes. Dr. Jana Hakim and Gentry Fitz, NP reviewed the pt's CT scans from 11/28/15. He confirmed that her target and non-target disease is stable. The pt's overall disease response is a "partial response".  He said that overall he is very pleased with her status, and he wishes to continue her study treatment without any modifications.Dr. Jana Hakim is very happy that the pt's imaging frequency will be at his discretion now.  The pt states she has been doing well. She golfed 18 holes yesterday and had a score of 85 again. She is performing all of her usual activities and taking her new puppy for long walks. ECOG=0  She reports the following AE's as ongoing: hot flashes, memory impairment, fatigue, nail changes, dry skin, and back pain.  She reports the following concomitant medications as ongoing: Lopressor, compazine prn, immodium prn, lopid, vitamin D, Tylenol arthritis prn, Prilosec, and ibuprofen prn. The pt returned her cycle 54 everolimus study drug boxes. She confirmed that she took her everolimus (10 mg) every day along with letrozole (2.5 mg) from 11/02/15 - 11/29/15 (28 days) except for 1  day on 11/08/15. The pt's everolimus drug boxes had 2 remaining (unopened) blister packs. The pt was dispensed 56 pills - 54 taken =2 pills returned. Therefore, the pt is 96.4 % compliant with her study drug, everolimus, for cycle 54. The pt's everolimus was taken to the pharmacy for the drug accountability check and storage. The pt was dispensed her cycle 55 everolimus study drug boxes for self administration. Since the pt is now in the extension phase, the pt was dispensed 6 boxes of study drug (168 pills were dispensed to the pt).  The pt is aware of her July appointments.The pt's lipid panel  is pending.  The research nurse will verify with Dr. Jana Hakim what assessments (labs and imaging) will be needed for her next visit.   Brion Aliment RN, BSN, [place] Clinical Research Nurse 11/30/2015 10:28 AM   12/01/15 at 8:54am- The pt's lipid panel results are back today, and her lab values are stable.   Brion Aliment RN, BSN, [place] Clinical Research Nurse 12/01/2015 8:55 AM   12/01/15 at 11:26am- Dr. Jana Hakim confirmed that he only wants the pt to have a cbc/diff and cmet at her next office visit.  He also stated that he only needs a CT chest every 3 months.  The research nurse called to inform the pt that she will not need to drink the oral contrast anymore since she is only having a CT chest performed for her "formal assessment of efficacy".  The pt was delighted to hear the good news.  The research nurse will confirm that her CT-chest is scheduled prior to her next visit in July.  Brion Aliment RN, BSN, CCRP Clinical Research Nurse 12/01/2015 11:31 AM

## 2015-11-29 IMAGING — CT CT CHEST W/ CM
2 of 5 series · 16 of 46 positions shown, 18 images · IV contrast (omnipaque)
Comparison: CT 03/22/2014

ADDENDUM:
Clinical research values were measured using RECIST protocol 1.0.
CLINICAL DATA: Right breast cancer with lung metastasis and brain
metastasis. Oral chemotherapy ongoing. Subsequent treatment
evaluation. RECIST protocol.

EXAM:
CT CHEST, ABDOMEN, AND PELVIS WITH CONTRAST
TECHNIQUE: Multidetector CT imaging of the chest, abdomen and pelvis was
performed following the standard protocol during bolus
administration of intravenous contrast.
CONTRAST:  50mL OMNIPAQUE IOHEXOL 300 MG/ML SOLN, 100mL OMNIPAQUE
IOHEXOL 300 MG/ML SOLN

[Series 2: cap with st · axial · 0.85mm/px · z∈[-493,+47]mm · 13 of 124 slices shown, 15 images]
[im 8/124  soft-tissue]
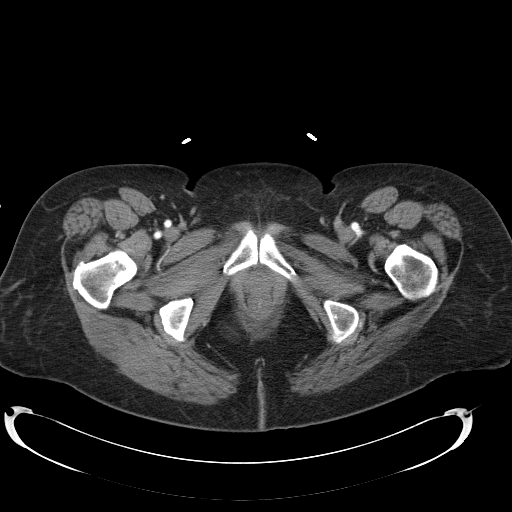
[im 8/124  bone]
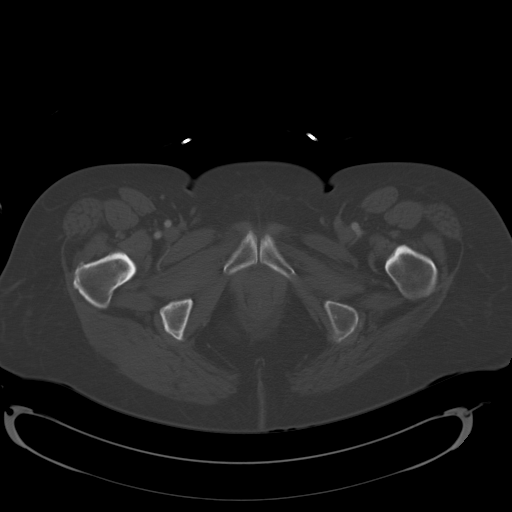
[im 15/124  soft-tissue]
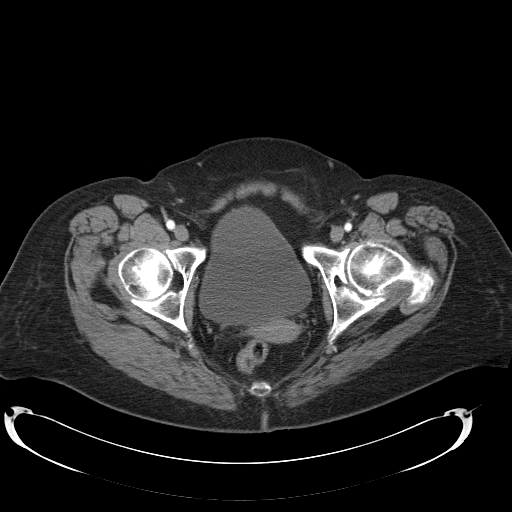
[im 29/124  soft-tissue]
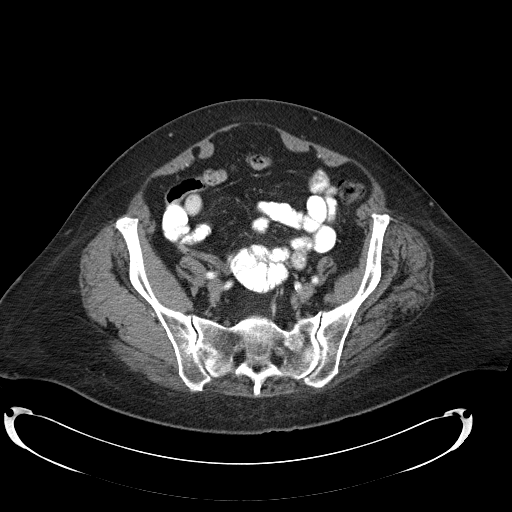
[im 37/124  soft-tissue]
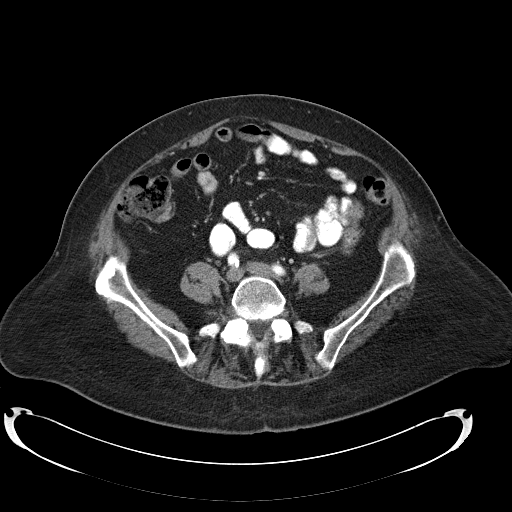
[im 44/124  soft-tissue]
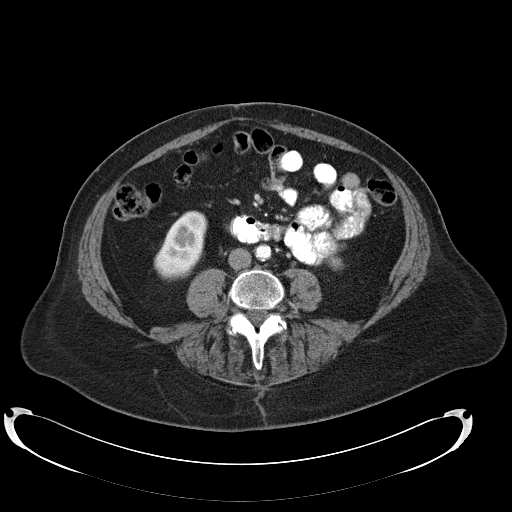
[im 51/124  soft-tissue]
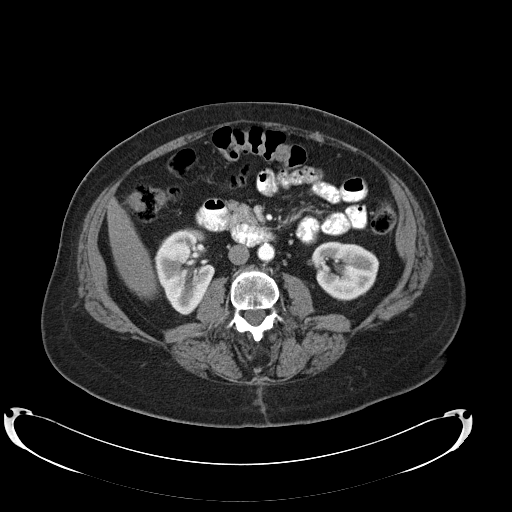
[im 66/124  soft-tissue]
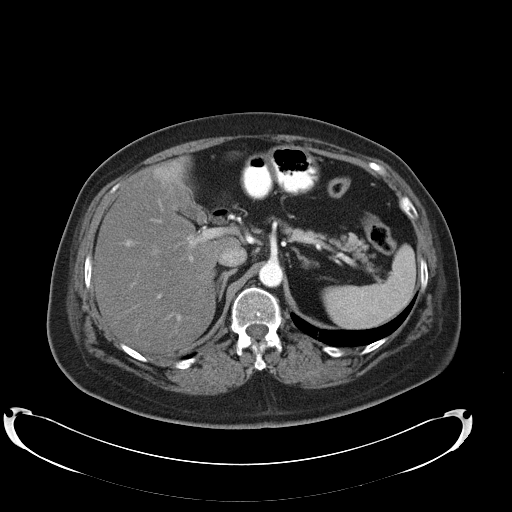
[im 73/124  soft-tissue]
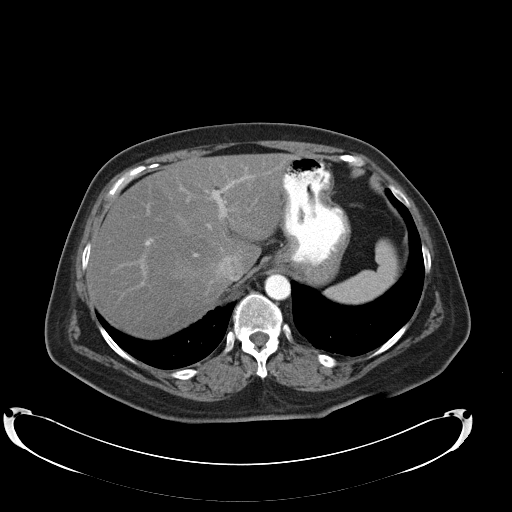
[im 80/124  soft-tissue]
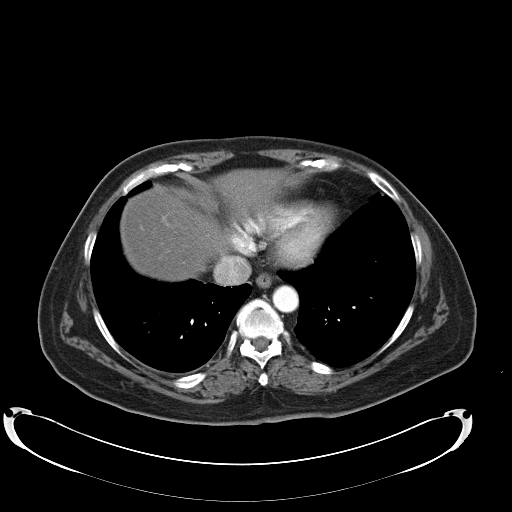
[im 80/124  bone]
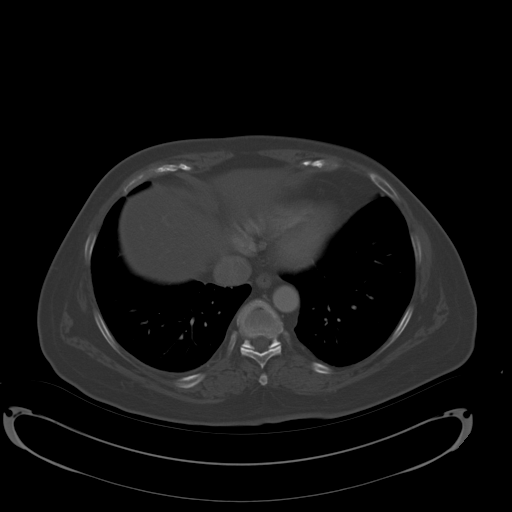
[im 87/124  soft-tissue]
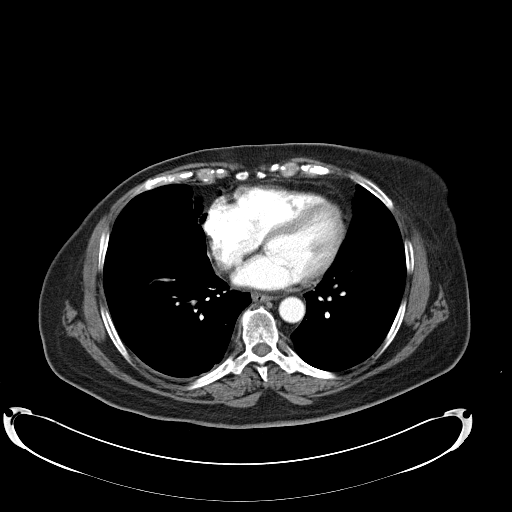
[im 95/124  soft-tissue]
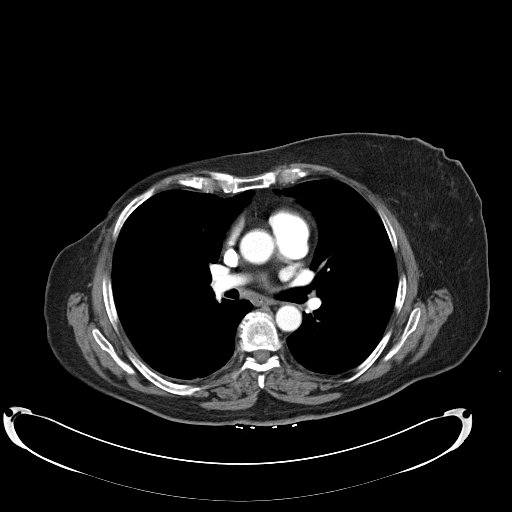
[im 109/124  soft-tissue]
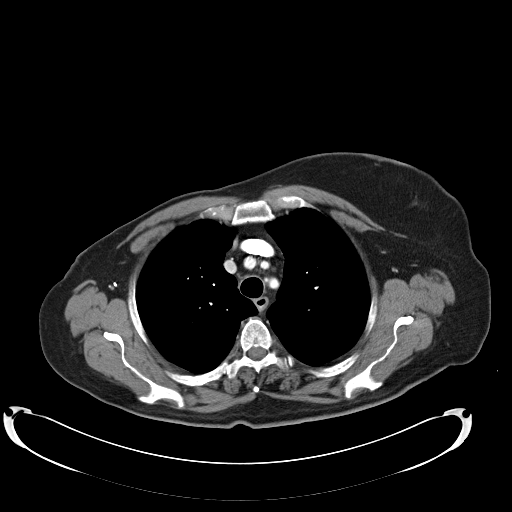
[im 116/124  soft-tissue]
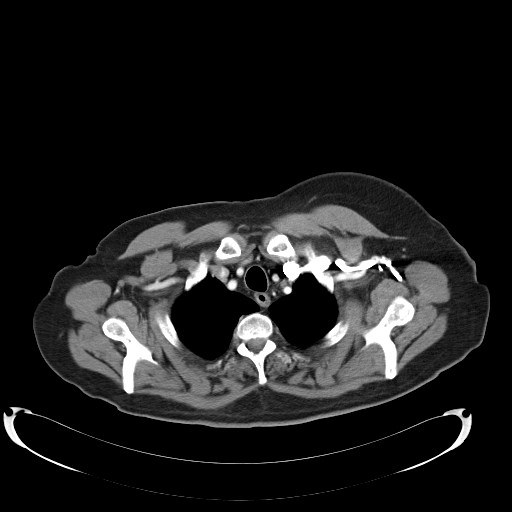

[Series 602: <mpr thick range> · coronal · 1.21mm/px · 3 of 94 slices shown]
[im 32/94  soft-tissue]
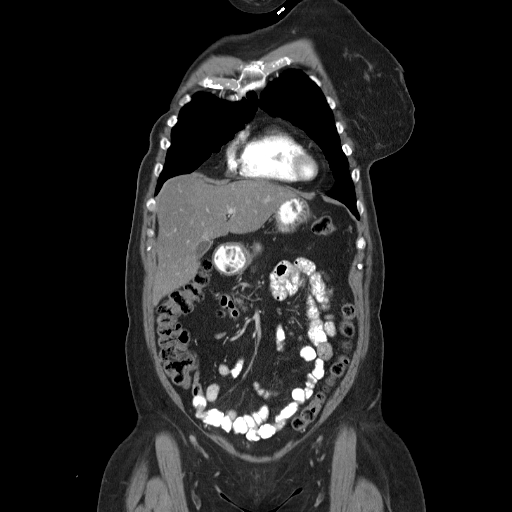
[im 42/94  soft-tissue]
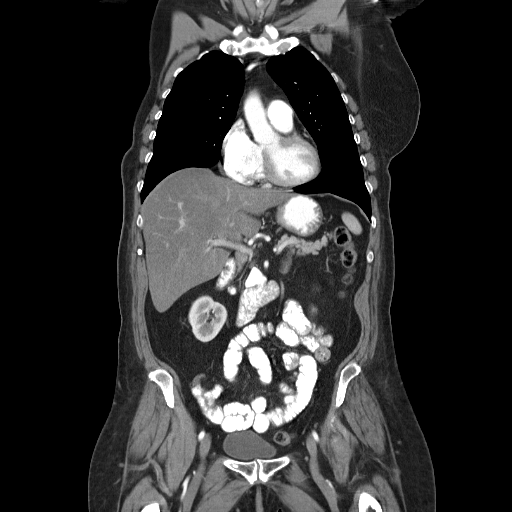
[im 52/94  soft-tissue]
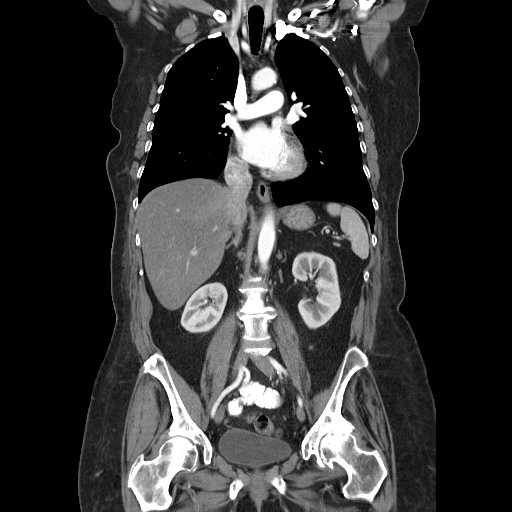

[16 of 46 positions shown; findings below may reference images not displayed]

RECIST

Target Lesions:

1. Right lower lobe pulmonary nodule measures 6 mm (image 40, series
4) unchanged from 6 mm.
2. Partially calcified subcarinal lymph node measures 6 mm (image 28
series 2) unchanged from 6 mm.
3. Right hilar lymph node measures 8 mm (image 28, series 2)
compared to 5 mm

Non-target Lesions

1. None
FINDINGS: CT CHEST FINDINGS

Right mastectomy anatomy. Right lymphadenectomy clips noted. No
axillary or supraclavicular lymphadenopathy. No mediastinal or hilar
lymphadenopathy. No pericardial fluid.

No new or suspicious pulmonary nodules.

CT ABDOMEN AND PELVIS FINDINGS

Hepatobiliary: Diffuse low-attenuation of the liver. No enhancing
hepatic lesion. No biliary duct dilatation.

Pancreas: The discretion at Pancreas is normal. No duct dilatation.
No pancreatic inflammation.

Spleen: Normal spleen.

Adrenals/Urinary Tract: Adrenal glands are normal. Kidneys are
normal.

Stomach/Bowel: Stomach, small bowel, appendix, cecum normal. The
colon and rectosigmoid colon are normal.

Vascular/Lymphatic: Insert clear retro

Reproductive: Uterus ovaries are normal.  No free fluid the pelvis.

Other: No omental or peritoneal disease.

Musculoskeletal: Stable sclerotic lesion in the left sacral ala on
image 96. Degenerative changes of the hips are noted.
IMPRESSION: Chest Impression:

1. No evidence of breast cancer recurrence or metastasis within the
thorax.
2. Post right mastectomy.

Abdomen / Pelvis Impression:

1. Stable exam of the abdomen and pelvis
2. Hepatic steatosis.
3. Stable sclerotic lesion in the sacrum

## 2015-11-30 ENCOUNTER — Other Ambulatory Visit: Payer: Self-pay | Admitting: *Deleted

## 2015-11-30 ENCOUNTER — Other Ambulatory Visit (HOSPITAL_BASED_OUTPATIENT_CLINIC_OR_DEPARTMENT_OTHER): Payer: Medicare Other

## 2015-11-30 ENCOUNTER — Ambulatory Visit (HOSPITAL_BASED_OUTPATIENT_CLINIC_OR_DEPARTMENT_OTHER): Payer: Medicare Other | Admitting: Nurse Practitioner

## 2015-11-30 ENCOUNTER — Telehealth: Payer: Self-pay | Admitting: Nurse Practitioner

## 2015-11-30 ENCOUNTER — Other Ambulatory Visit (HOSPITAL_COMMUNITY)
Admission: AD | Admit: 2015-11-30 | Discharge: 2015-11-30 | Disposition: A | Discharge status: Home / Self Care | Payer: Medicare Other | Source: Ambulatory Visit | Attending: Oncology | Admitting: Oncology

## 2015-11-30 VITALS — BP 109/63 | HR 87 | Temp 97.5°F | Resp 18 | Ht 64.0 in | Wt 167.0 lb

## 2015-11-30 DIAGNOSIS — C7800 Secondary malignant neoplasm of unspecified lung: Principal | ICD-10-CM

## 2015-11-30 DIAGNOSIS — C50911 Malignant neoplasm of unspecified site of right female breast: Secondary | ICD-10-CM

## 2015-11-30 DIAGNOSIS — C773 Secondary and unspecified malignant neoplasm of axilla and upper limb lymph nodes: Secondary | ICD-10-CM

## 2015-11-30 DIAGNOSIS — C7951 Secondary malignant neoplasm of bone: Secondary | ICD-10-CM | POA: Diagnosis not present

## 2015-11-30 DIAGNOSIS — Z171 Estrogen receptor negative status [ER-]: Secondary | ICD-10-CM

## 2015-11-30 DIAGNOSIS — Z006 Encounter for examination for normal comparison and control in clinical research program: Secondary | ICD-10-CM

## 2015-11-30 DIAGNOSIS — Z79811 Long term (current) use of aromatase inhibitors: Secondary | ICD-10-CM

## 2015-11-30 DIAGNOSIS — C50411 Malignant neoplasm of upper-outer quadrant of right female breast: Secondary | ICD-10-CM | POA: Insufficient documentation

## 2015-11-30 LAB — CBC WITH DIFFERENTIAL/PLATELET
BASO%: 0.8 % (ref 0.0–2.0)
BASOS ABS: 0.1 10*3/uL (ref 0.0–0.1)
EOS%: 2.6 % (ref 0.0–7.0)
Eosinophils Absolute: 0.2 10*3/uL (ref 0.0–0.5)
HEMATOCRIT: 41.7 % (ref 34.8–46.6)
HEMOGLOBIN: 13.4 g/dL (ref 11.6–15.9)
LYMPH#: 0.9 10*3/uL (ref 0.9–3.3)
LYMPH%: 14.3 % (ref 14.0–49.7)
MCH: 25.7 pg (ref 25.1–34.0)
MCHC: 32.1 g/dL (ref 31.5–36.0)
MCV: 80 fL (ref 79.5–101.0)
MONO#: 0.7 10*3/uL (ref 0.1–0.9)
MONO%: 11.4 % (ref 0.0–14.0)
NEUT%: 70.9 % (ref 38.4–76.8)
NEUTROS ABS: 4.4 10*3/uL (ref 1.5–6.5)
Platelets: 266 10*3/uL (ref 145–400)
RBC: 5.21 10*6/uL (ref 3.70–5.45)
RDW: 14.7 % — AB (ref 11.2–14.5)
WBC: 6.2 10*3/uL (ref 3.9–10.3)

## 2015-11-30 LAB — COMPREHENSIVE METABOLIC PANEL
ALT: 28 U/L (ref 0–55)
ANION GAP: 13 meq/L — AB (ref 3–11)
AST: 35 U/L — ABNORMAL HIGH (ref 5–34)
Albumin: 3.5 g/dL (ref 3.5–5.0)
Alkaline Phosphatase: 240 U/L — ABNORMAL HIGH (ref 40–150)
BILIRUBIN TOTAL: 0.37 mg/dL (ref 0.20–1.20)
BUN: 23.1 mg/dL (ref 7.0–26.0)
CALCIUM: 9.9 mg/dL (ref 8.4–10.4)
CHLORIDE: 109 meq/L (ref 98–109)
CO2: 18 meq/L — AB (ref 22–29)
Creatinine: 1.1 mg/dL (ref 0.6–1.1)
EGFR: 54 mL/min/{1.73_m2} — AB (ref >90)
Glucose: 148 mg/dl — ABNORMAL HIGH (ref 70–140)
Potassium: 4.1 mEq/L (ref 3.5–5.1)
Sodium: 140 mEq/L (ref 136–145)
TOTAL PROTEIN: 8 g/dL (ref 6.4–8.3)

## 2015-11-30 LAB — CK: Total CK: 67 U/L (ref 38–234)

## 2015-11-30 LAB — URIC ACID: URIC ACID, SERUM: 3.6 mg/dL (ref 2.6–7.4)

## 2015-11-30 LAB — PHOSPHORUS: PHOSPHORUS: 3 mg/dL (ref 2.5–4.6)

## 2015-11-30 LAB — BILIRUBIN, DIRECT

## 2015-11-30 MED ORDER — LETROZOLE 2.5 MG PO TABS: 2.5000 mg | ORAL_TABLET | Freq: Every day | ORAL | Status: DC

## 2015-11-30 MED ORDER — INV-EVEROLIMUS (RAD0001) 5MG TABLET NOVARTIS CRAD001Y24135: 2.0000 | ORAL_TABLET | Freq: Every day | ORAL | Status: DC

## 2015-11-30 NOTE — Progress Notes (Signed)
[facility]  [phone] [phone]) [phone]     ID: ANAVI BRANSCUM DOB: 27-Mar-1951  MR#: [id number]  TDV#:[id number]  Patient Care Team: Darcus Austin, MD as PCP - General (Family Medicine) Jerline Pain, MD (Cardiology) Gaye Pollack, MD (Cardiothoracic Surgery) Chauncey Cruel, MD as Consulting Physician (Oncology) OTHER MD: Dr Erroll Luna- Merlene Laughter, DDS; Gaynelle Arabian MD  CHIEF COMPLAINT: metastatic breast cancer, on BOLERO study  CURRENT TREATMENT: letrozole + everolimus  BREAST CANCER HISTORY: From Dr. Collier [name]'s original intake note 04/13/2004:  "This woman has been in good health all of her life. She recently moved from [place] to work here.  She palpated a mass at about the 12 o'clock position in mid-July. She has not noticed any nipple retraction or skin changes.  She was seen by her primary care doctor who subsequently referred her for a mammogram.  Mammogram was performed on 03/01/04. This demonstrated a spiculated 2 cm mass at the 12 o'clock position in the right breast.  Upper outer quadrant of the left breast shows some distortion.   Physical exam at that time showed a firm, nontender nodule at the 12 o'clock position in the right breast, 5 cm from the nipple.  Ultrasound of this area showed a hypoechoic ill-defined mass, measuring 2.2 x 1.3 x 1.4 cm.  Physical exam of the left breast showed general vague thickening, upper outer quadrant of the left breast with a discrete palpable mass. The ultrasound performed showed a single hypoechoic ill-defined nodule at the 1 o'clock position, measuring 7 x 5 x 6 mm.  She had biopsies of both lesions on 03/02/03.  Needle core biopsy of the lesion on the right breast revealed invasive mammary carcinoma.  Needle core biopsy of the left breast showed a complex fibroadenoma.  Prognostic panel of the lesion on the right breast showed it to be ER positive at 73%, PR positive at 90% and proliferative index 9%, HER-2  was 1+.  Patient was referred to Dr. Annamaria Boots, who performed a simple mastectomy with sentinel lymph node evaluation on 03/22/04.  Final pathology showed this to be an invasive ductal carcinoma  with lobular features, measuring 2.2 cm, grade 2 of 3.  Margins negative for carcinoma.  Invasive ductal carcinoma was extended to involve deep dermis of the nipple.  Lymphovascular invasion was identified.  Total of 4 sentinel lymph nodes were evaluated.  Touch imprints at the time of the OR was felt to be negative.  Subsequent evaluation showed a 5 mm focus of metastatic carcinoma in one of the four lymph nodes on microscopic after sectioning.  There was extracapsular extension  of one lymph node as well. The remaining three lymph nodes were all negative."  The patient's subsequent history is detailed below.  INTERVAL HISTORY: Salle returns today for follow up of her metastatic estrogen receptor positive breast cancer, accompanied by her research nurse, [name]. Neomi is currently on cycle 55 of the BOLERO-4 trial with letrozole and everolimus. She has always tolerated this well with few complaints. She has some hot flashes, but denies vaginal changes, mucositis, or pneumonitis.   REVIEW OF SYSTEMS: Leaner is doing well. She is golfing at least weekly and her hip is holding well. She is able to get through all 18 holes with no issues. Her game is improving. She is able to take her dogs for long walks. She has few complaints, just the usual dry skin, back pain, and mild fatigue. A detailed review of systems  is otherwise stable.  PAST MEDICAL HISTORY: Past Medical History  Diagnosis Date  . Shortness of breath   . Arthritis     left hip  . Osteopenia due to cancer therapy 09/09/2013  . Hypercholesteremia   . breast ca 2005    breast/chemo R mastectomy  . Metastasis to lung ([facility]) dx'd 08/2011  . GERD (gastroesophageal reflux disease)   . Palpitations   . Asthma     PAST SURGICAL HISTORY: Past  Surgical History  Procedure Laterality Date  . Spine surgery  1996  . Breast surgery  2005    right  . Video bronchoscopy  09/11/2011    Procedure: VIDEO BRONCHOSCOPY;  Surgeon: Gaye Pollack, MD;  Location: [facility];  Service: Thoracic;  Laterality: N/A;  . Chest tube insertion  09/11/2011    Procedure: INSERTION PLEURAL DRAINAGE CATHETER;  Surgeon: Gaye Pollack, MD;  Location: [facility];  Service: Thoracic;  Laterality: Right;  . Pericardial window  09/11/2011    Procedure: PERICARDIAL WINDOW;  Surgeon: Gaye Pollack, MD;  Location: [facility];  Service: Thoracic;  Laterality: N/A;  . Removal of pleural drainage catheter  12/19/2011    Procedure: REMOVAL OF PLEURAL DRAINAGE CATHETER;  Surgeon: Gaye Pollack, MD;  Location: [facility];  Service: Thoracic;  Laterality: Right;  TO BE DONE IN MINOR ROOM, SHORT STAY  . Cataract extraction w/phaco Left 01/18/2014    Procedure: CATARACT EXTRACTION PHACO AND INTRAOCULAR LENS PLACEMENT (IOC);  Surgeon: Elta Guadeloupe T. Gershon Crane, MD;  Location: [facility];  Service: Ophthalmology;  Laterality: Left;  CDE 15.79  . Cataract extraction w/phaco Right 02/08/2014    Procedure: CATARACT EXTRACTION PHACO AND INTRAOCULAR LENS PLACEMENT (IOC);  Surgeon: Elta Guadeloupe T. Gershon Crane, MD;  Location: [facility];  Service: Ophthalmology;  Laterality: Right;  CDE 4.41  . Total hip arthroplasty Left 06/07/2015    Procedure: LEFT TOTAL HIP ARTHROPLASTY ANTERIOR APPROACH;  Surgeon: Gaynelle Arabian, MD;  Location: [facility];  Service: Orthopedics;  Laterality: Left;    FAMILY HISTORY Family History  Problem Relation Age of Onset  . Cancer Mother     breast  . Heart disease Father   . Anesthesia problems Neg Hx    the patient's mother was diagnosed with breast cancer at age 20, she died age 86 from congestive heart failure..The patient's father died from heart disease at age 69.  She has one sister alive & well.  Two brothers alive & well, one with diabetes. The patient's sister was also diagnosed with breast cancer,  and was tested for the BRCA gene, and was negative. The patient herself has not been tested. There is no history of ovarian cancer in the family  GYNECOLOGIC HISTORY:  No LMP recorded. Patient is postmenopausal. Menarche age 43, the patient is GX P0. She stopped having periods with chemotherapy in 2005. 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 She was a Archivist. She is single, lives alone with her Shitzu-poodle Sammie.. Family is all in the [place] area.     ADVANCED DIRECTIVES: Not in place. At the 02/23/2014 visit the patient was given the appropriate documents to complete and notarize at her discretion. She tells me she is planning to name her sister, Billie Ruddy, as healthcare power of attorney. [name] can be reached at [phone]   HEALTH MAINTENANCE: Social History  Substance Use Topics  . Smoking status:  Former Smoker -- 1.50 packs/day for 30 years    Types: Cigarettes    Quit date: 09/10/2007  . Smokeless tobacco: Not on file  . Alcohol Use: No     Colonoscopy:  PAP:  Bone density: 08/14/2015, T score -1.6  Lipid panel:  Allergies  Allergen Reactions  . Aspirin     REACTION: upset stomach  . Latex Other (See Comments)    Blistering and skin peels off  . Nsaids Nausea And Vomiting    Extreme nausea and vomiting    Current Outpatient Prescriptions  Medication Sig Dispense Refill  . cholecalciferol (VITAMIN D) 1000 units tablet Take 1,000 Units by mouth 2 (two) times daily.    Marland Kitchen gemfibrozil (LOPID) 600 MG tablet Take 1 tablet (600 mg total) by mouth 2 (two) times daily before a meal. 180 tablet 12  . Investigational everolimus (RAD001) 5 MG tablet Novartis [id number] Take 2 tablets by mouth daily. Take with a glass of water. 70 tablet 0  . metoprolol tartrate (LOPRESSOR) 25 MG tablet Take 0.5 tablets (12.5 mg total) by mouth daily. 30 tablet  4  . omeprazole (PRILOSEC) 40 MG capsule Take 1 capsule (40 mg total) by mouth daily as needed. 90 capsule 3  . Investigational everolimus (RAD001) 5 MG tablet Novartis [id number] Take 2 tablets by mouth daily. Take with a glass of water. 168 tablet 0  . letrozole (FEMARA) 2.5 MG tablet Take 1 tablet (2.5 mg total) by mouth daily. 30 tablet 2  . loperamide (IMODIUM) 2 MG capsule Take 2 mg by mouth 4 (four) times daily as needed for diarrhea or loose stools. Reported on 11/30/2015    . ondansetron (ZOFRAN) 8 MG tablet Take 1 tablet (8 mg total) by mouth 2 (two) times daily as needed (Nausea or vomiting). (Patient not taking: Reported on 09/20/2015) 30 tablet 1  . prochlorperazine (COMPAZINE) 25 MG suppository Place 1 suppository (25 mg total) rectally every 12 (twelve) hours as needed for nausea. (Patient not taking: Reported on 09/20/2015) 12 suppository 3   No current facility-administered medications for this visit.    OBJECTIVE: Middle-aged white woman Who appears well Filed Vitals:   11/30/15 0904 11/30/15 0905  BP: 117/60 109/63  Pulse:    Temp:    Resp:       Body mass index is 28.65 kg/(m^2).    ECOG FS:0 - Asymptomatic  Skin: warm, dry  HEENT: sclerae anicteric, conjunctivae pink, oropharynx clear. No thrush or mucositis.  Lymph Nodes: No cervical or supraclavicular lymphadenopathy  Lungs: clear to auscultation bilaterally, no rales, wheezes, or rhonci  Heart: regular rate and rhythm  Abdomen: round, soft, non tender, positive bowel sounds  Musculoskeletal: No focal spinal tenderness, no peripheral edema  Neuro: non focal, well oriented, positive affect  Breasts: deferred. Bilateral axillae benign.  LAB RESULTS:  CMP     Component Value Date/Time   NA 140 11/30/2015 0849   NA 137 09/20/2015 1257   K 4.1 11/30/2015 0849   K 4.3 09/20/2015 1257   CL 106 09/20/2015 1257   CL 109* 12/31/2012 0940   CO2 18* 11/30/2015 0849   CO2 23 09/20/2015 1257   GLUCOSE 148*  11/30/2015 0849   GLUCOSE 265* 09/20/2015 1257   GLUCOSE 127* 12/31/2012 0940   BUN 23.1 11/30/2015 0849   BUN 20 09/20/2015 1257   CREATININE 1.1 11/30/2015 0849   CREATININE 0.80 09/20/2015 1257   CALCIUM 9.9 11/30/2015 0849   CALCIUM 9.3 09/20/2015 1257   PROT 8.0  11/30/2015 0849   PROT 7.4 05/29/2015 1415   ALBUMIN 3.5 11/30/2015 0849   ALBUMIN 3.6 05/29/2015 1415   AST 35* 11/30/2015 0849   AST 51* 05/29/2015 1415   ALT 28 11/30/2015 0849   ALT 39 05/29/2015 1415   ALKPHOS 240* 11/30/2015 0849   ALKPHOS 203* 05/29/2015 1415   BILITOT 0.37 11/30/2015 0849   BILITOT 0.3 05/29/2015 1415   GFRNONAA >60 09/20/2015 1257   GFRAA >60 09/20/2015 1257    I No results found for: SPEP  Lab Results  Component Value Date   WBC 6.2 11/30/2015   NEUTROABS 4.4 11/30/2015   HGB 13.4 11/30/2015   HCT 41.7 11/30/2015   MCV 80.0 11/30/2015   PLT 266 11/30/2015      Chemistry      Component Value Date/Time   NA 140 11/30/2015 0849   NA 137 09/20/2015 1257   K 4.1 11/30/2015 0849   K 4.3 09/20/2015 1257   CL 106 09/20/2015 1257   CL 109* 12/31/2012 0940   CO2 18* 11/30/2015 0849   CO2 23 09/20/2015 1257   BUN 23.1 11/30/2015 0849   BUN 20 09/20/2015 1257   CREATININE 1.1 11/30/2015 0849   CREATININE 0.80 09/20/2015 1257      Component Value Date/Time   CALCIUM 9.9 11/30/2015 0849   CALCIUM 9.3 09/20/2015 1257   ALKPHOS 240* 11/30/2015 0849   ALKPHOS 203* 05/29/2015 1415   AST 35* 11/30/2015 0849   AST 51* 05/29/2015 1415   ALT 28 11/30/2015 0849   ALT 39 05/29/2015 1415   BILITOT 0.37 11/30/2015 0849   BILITOT 0.3 05/29/2015 1415       Lab Results  Component Value Date   LABCA2 58* 09/14/2012    No components found for: [id number]  No results for input(s): INR in the last 168 hours.  Urinalysis    Component Value Date/Time   COLORURINE YELLOW 09/20/2015 1432   APPEARANCEUR HAZY* 09/20/2015 1432   LABSPEC >1.030* 09/20/2015 1432   LABSPEC 1.030  07/13/2015 0841   PHURINE 5.5 09/20/2015 1432   PHURINE 6.0 07/13/2015 0841   GLUCOSEU 500* 09/20/2015 1432   GLUCOSEU Negative 07/13/2015 0841   HGBUR MODERATE* 09/20/2015 1432   HGBUR Large 07/13/2015 0841   BILIRUBINUR NEGATIVE 09/20/2015 1432   BILIRUBINUR Negative 07/13/2015 0841   KETONESUR NEGATIVE 09/20/2015 1432   KETONESUR Negative 07/13/2015 0841   PROTEINUR 30* 09/20/2015 1432   PROTEINUR 300 07/13/2015 0841   UROBILINOGEN 0.2 07/13/2015 0841   UROBILINOGEN 1.0 05/29/2015 1420   NITRITE NEGATIVE 09/20/2015 1432   NITRITE Negative 07/13/2015 0841   LEUKOCYTESUR NEGATIVE 09/20/2015 1432   LEUKOCYTESUR Negative 07/13/2015 0841    STUDIES: Ct Chest W Contrast  11/28/2015  CLINICAL DATA:  Right-sided breast cancer diagnosed in 2005 in 2013. Right mastectomy. On oral chemotherapy. Lung metastasis. EXAM: CT CHEST, ABDOMEN, AND PELVIS WITH CONTRAST TECHNIQUE: Multidetector CT imaging of the chest, abdomen and pelvis was performed following the standard protocol during bolus administration of intravenous contrast. CONTRAST:  163m ISOVUE-300 IOPAMIDOL (ISOVUE-300) INJECTION 61% COMPARISON:  10/03/2015 FINDINGS: RECIST 1.0 Target Lesions: 1. Similar right lower lobe 5 mm pulmonary nodule, including on image 95/series 4. 2. Subcarinal node which measures 6 mm on image 28/ series 2 and is not significantly changed. Non-target Lesions: 1. Similar right-sided pleural thickening and trace fluid. CT CHEST FINDINGS Mediastinum/Lymph Nodes: No supraclavicular adenopathy. Right axillary node dissection and mastectomy. Minimal left breast nodularity is unchanged. No left axillary adenopathy. Tortuous thoracic aorta.  Borderline cardiomegaly. No pericardial effusion. No central pulmonary embolism, on this non-dedicated study. No mediastinal or hilar adenopathy. Lungs/Pleura: Trace right-sided pleural thickening and fluid, similar. No pleural fluid. Mild centrilobular emphysema. 2 mm right upper lobe  pulmonary nodule on image 49/series 4, similar given differences in slice thickness. 5 mm right upper lobe pulmonary nodule on image 95/series 4. Musculoskeletal: Similar sclerosis involving the posterior medial left tenth rib on image 48/series 2. Subtle right anterior second rib sclerosis is also similar. CT ABDOMEN PELVIS FINDINGS Hepatobiliary: Moderate hepatic steatosis. Vague right hepatic lobe area of hyper enhancement is similar, on the order of 11 mm on image 52/series 2. Soft tissue density within the gallbladder fundus measures 8 mm on image 69/series 2. This is similar. Common duct upper normal for patient age, including at 6 mm. Pancreas: Normal, without mass or ductal dilatation. Periampullary duodenal diverticulum. Spleen: Normal in size, without focal abnormality. Adrenals/Urinary Tract: Normal right adrenal gland. Left adrenal nodule measures on the order of 12 mm on image 60/series 2. This is similar (when remeasured). Smaller nodule was present back in 2014, suggesting a benign etiology. Normal kidneys, without hydronephrosis. Degraded evaluation of the pelvis, secondary to beam hardening artifact from left hip arthroplasty. Grossly normal urinary bladder. Stomach/Bowel: Normal stomach, without wall thickening. Normal colon, appendix, and terminal ileum. Normal small bowel. Vascular/Lymphatic: Aortic and branch vessel atherosclerosis. No abdominal and no gross pelvic adenopathy. Reproductive: similar eccentric left position of the uterus. No adnexal mass. Other: No significant free fluid. No evidence of omental or peritoneal disease. Musculoskeletal: Left hip arthroplasty. Vague sclerosis involving the left iliac wing and left side of the sacrum are similar. IMPRESSION: 1. Similar sclerotic osseous metastasis. 2. No extraosseous metastasis identified within the chest abdomen or pelvis. 3. Hepatic steatosis. 4. Similar left adrenal nodule, likely an adenoma. 5. Gallbladder soft tissue density  within the fundus is suspicious for polyp or adenomyomatosis. 6. Right hepatic lobe hyperenhancement likely represents a perfusion anomaly and is unchanged. Electronically Signed   By: Abigail Miyamoto M.D.   On: 11/28/2015 14:35   Ct Abdomen Pelvis W Contrast  11/28/2015  CLINICAL DATA:  Right-sided breast cancer diagnosed in 2005 in 2013. Right mastectomy. On oral chemotherapy. Lung metastasis. EXAM: CT CHEST, ABDOMEN, AND PELVIS WITH CONTRAST TECHNIQUE: Multidetector CT imaging of the chest, abdomen and pelvis was performed following the standard protocol during bolus administration of intravenous contrast. CONTRAST:  171m ISOVUE-300 IOPAMIDOL (ISOVUE-300) INJECTION 61% COMPARISON:  10/03/2015 FINDINGS: RECIST 1.0 Target Lesions: 1. Similar right lower lobe 5 mm pulmonary nodule, including on image 95/series 4. 2. Subcarinal node which measures 6 mm on image 28/ series 2 and is not significantly changed. Non-target Lesions: 1. Similar right-sided pleural thickening and trace fluid. CT CHEST FINDINGS Mediastinum/Lymph Nodes: No supraclavicular adenopathy. Right axillary node dissection and mastectomy. Minimal left breast nodularity is unchanged. No left axillary adenopathy. Tortuous thoracic aorta. Borderline cardiomegaly. No pericardial effusion. No central pulmonary embolism, on this non-dedicated study. No mediastinal or hilar adenopathy. Lungs/Pleura: Trace right-sided pleural thickening and fluid, similar. No pleural fluid. Mild centrilobular emphysema. 2 mm right upper lobe pulmonary nodule on image 49/series 4, similar given differences in slice thickness. 5 mm right upper lobe pulmonary nodule on image 95/series 4. Musculoskeletal: Similar sclerosis involving the posterior medial left tenth rib on image 48/series 2. Subtle right anterior second rib sclerosis is also similar. CT ABDOMEN PELVIS FINDINGS Hepatobiliary: Moderate hepatic steatosis. Vague right hepatic lobe area of hyper enhancement is similar,  on  the order of 11 mm on image 52/series 2. Soft tissue density within the gallbladder fundus measures 8 mm on image 69/series 2. This is similar. Common duct upper normal for patient age, including at 6 mm. Pancreas: Normal, without mass or ductal dilatation. Periampullary duodenal diverticulum. Spleen: Normal in size, without focal abnormality. Adrenals/Urinary Tract: Normal right adrenal gland. Left adrenal nodule measures on the order of 12 mm on image 60/series 2. This is similar (when remeasured). Smaller nodule was present back in 2014, suggesting a benign etiology. Normal kidneys, without hydronephrosis. Degraded evaluation of the pelvis, secondary to beam hardening artifact from left hip arthroplasty. Grossly normal urinary bladder. Stomach/Bowel: Normal stomach, without wall thickening. Normal colon, appendix, and terminal ileum. Normal small bowel. Vascular/Lymphatic: Aortic and branch vessel atherosclerosis. No abdominal and no gross pelvic adenopathy. Reproductive: similar eccentric left position of the uterus. No adnexal mass. Other: No significant free fluid. No evidence of omental or peritoneal disease. Musculoskeletal: Left hip arthroplasty. Vague sclerosis involving the left iliac wing and left side of the sacrum are similar. IMPRESSION: 1. Similar sclerotic osseous metastasis. 2. No extraosseous metastasis identified within the chest abdomen or pelvis. 3. Hepatic steatosis. 4. Similar left adrenal nodule, likely an adenoma. 5. Gallbladder soft tissue density within the fundus is suspicious for polyp or adenomyomatosis. 6. Right hepatic lobe hyperenhancement likely represents a perfusion anomaly and is unchanged. Electronically Signed   By: Abigail Miyamoto M.D.   On: 11/28/2015 14:35      ASSESSMENT: 65 y.o. [place], [place] woman with stage IV breast cancer, on BOLERO-4 trial  (1) status post right mastectomy and sentinel lymph node sampling 03/22/2004 for a pT2 pN1, stage IIB invasive ductal  carcinoma with lobular features, grade 2, estrogen receptor and progesterone receptor positive, HER-2 negative, with an MIB-1 of 9% ([id number] and [id number])  (2) addition all right axillary lymph node sampling 05/07/2004 showed 2 benign lymph nodes (4 lymph nodes previously removed, so total was one positive lymph node out of 6; [id number])  (3) the patient was evaluated by radiation oncology; no postmastectomy radiation recommended  (4) adjuvant chemotherapy with dose dense doxorubicin and cyclophosphamide x4 cycles (first cycle delayed one week) followed by dose dense paclitaxel x4 was completed 09/18/2004  (5) tamoxifen started March 2006, discontinued 2009  METASTATIC DISEASE: (6) presenting with a large pericardial effusion, large right pleural effusion and possible right middle lobe bronchial obstruction February 2013, status post pericardial window placement, fiberoptic bronchoscopy and right Pleurx placement 09/11/2011, with biopsy of the bronchus intermedius and pericardium positive for metastatic breast cancer, estrogen receptor 91% positive with moderate staining intensity, progesterone receptor 100% positive with strong staining intensity, with an MIB-1 of 35%, and no HER-2 amplification, the signals ratio being 1.37 ([id number])  (7) enrolled in BOLERO-4 trial 10/10/2011, receiving letrozole and everolimus  (a) two small areas of enhancement in the cerebellum noted by brain MRI 10/03/2011 were no longer apparent on repeat MRI 08/21/2012-- most recent brain MRI 04/01/2014 showed no evidence of intracranial metastatic disease  (b) sclerotic lesions in left iliac bone and sacrum have not been biopsied; stable; plan is to start zolendronate after patient updates her dental care (extraction planned)  (c) RLL lung nodule, stable (rescanned 07/12/2014 and 08/11/2014) R hilar and subcarinal lymph nodes: stable  (9) additional problems:  (a) hepatic steatosis  (b) COPD/ emphysema/ asthma  (c)  advanced L hip osteoarthritis, status post left total hip replacement 06/07/2015  (d) aortoiliac atherosclerosis  (e) dental evaluation pending w possible  dental extractions  (f) possible thalassemia trait  (10) Bone density concerns: DEXA scan 08/11/2013 was normal   PLAN: [name] and I reviewed the results of her CT scan. Again, there is no evidence of disease progression. She will continue on letrozole and everolimus indefinitely until there is. We are going to spread out the frequency of her scans at this point to every 3 months instead of every 8 weeks.   I have printed a prescription for letrozole for her to shop around, as the price for a 30 supply has jumped to $120. She also thinks she might have found a card online to apply for a discount.  Kerrianne will return in the last week in July. She understands and agrees with this plan. She knows the goal of treatment in her case is control. She has been encouraged to call with any issues that might arise before her next visit here.   Total time spent in appointment was 25 minutes, with greater than 50% of the time spent face to face with the patient.   Laurie Panda, NP   11/30/2015 4:29 PM

## 2015-11-30 NOTE — Telephone Encounter (Signed)
appt made and avs printed °

## 2015-12-01 ENCOUNTER — Telehealth: Payer: Self-pay | Admitting: Oncology

## 2015-12-01 ENCOUNTER — Encounter: Payer: Self-pay | Admitting: Nurse Practitioner

## 2015-12-01 LAB — LIPID PANEL
CHOL/HDL RATIO: 4.9 ratio — AB (ref 0.0–4.4)
Cholesterol, Total: 261 mg/dL — ABNORMAL HIGH (ref 100–199)
HDL: 53 mg/dL (ref >39)
LDL Calculated: 157 mg/dL — ABNORMAL HIGH (ref 0–99)
Triglycerides: 253 mg/dL — ABNORMAL HIGH (ref 0–149)
VLDL Cholesterol Cal: 51 mg/dL — ABNORMAL HIGH (ref 5–40)

## 2015-12-01 NOTE — Telephone Encounter (Signed)
per pof to sch pt CT 11-pt will need to [facility] scheduling of time for CT scan

## 2015-12-28 ENCOUNTER — Ambulatory Visit: Payer: Medicare Other | Admitting: Nurse Practitioner

## 2015-12-28 ENCOUNTER — Other Ambulatory Visit: Payer: Medicare Other

## 2016-01-04 ENCOUNTER — Other Ambulatory Visit: Payer: Self-pay | Admitting: *Deleted

## 2016-01-04 DIAGNOSIS — C50911 Malignant neoplasm of unspecified site of right female breast: Secondary | ICD-10-CM

## 2016-01-04 DIAGNOSIS — C7800 Secondary malignant neoplasm of unspecified lung: Principal | ICD-10-CM

## 2016-01-04 IMAGING — CT CT ABD-PELV W/O CM
2 of 3 series · 16 of 46 positions shown, 18 images · non-contrast
Comparison: Abdominal pelvic CT 05/17/2014.

CLINICAL DATA: Left flank pain with hematuria and proteinuria.
History of breast cancer, chemotherapy ongoing. Initial encounter.

EXAM:
CT ABDOMEN AND PELVIS WITHOUT CONTRAST
TECHNIQUE: Multidetector CT imaging of the abdomen and pelvis was performed
following the standard protocol without IV contrast.

[Series 2: stone under (id) w prev · axial · 0.81mm/px · z∈[-422,-72]mm · 13 of 82 slices shown, 15 images]
[im 6/82  soft-tissue]
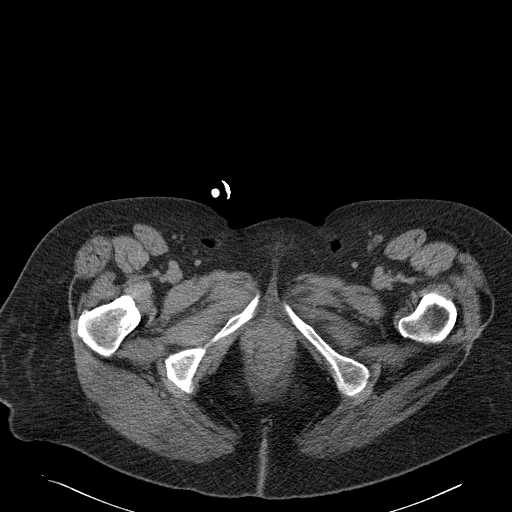
[im 6/82  bone]
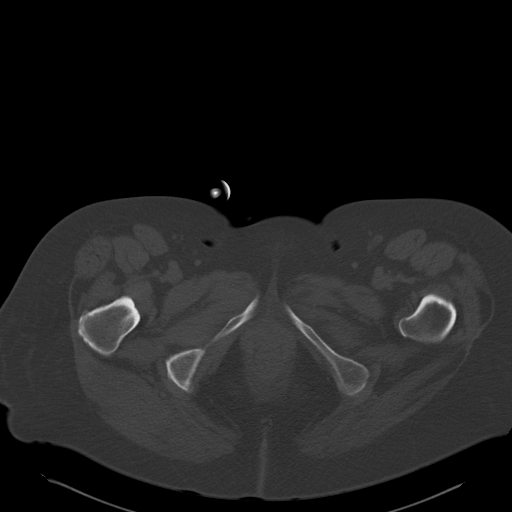
[im 11/82  soft-tissue]
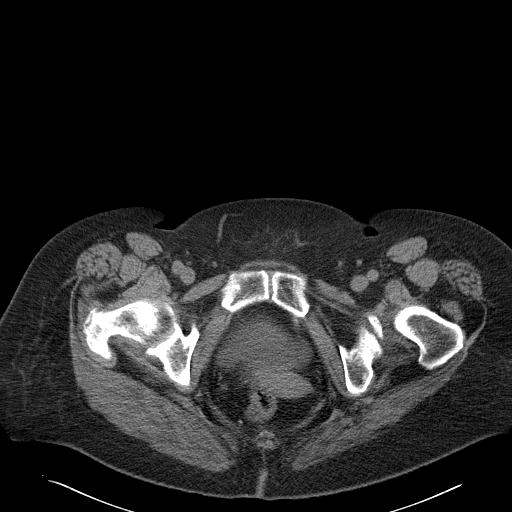
[im 16/82  soft-tissue]
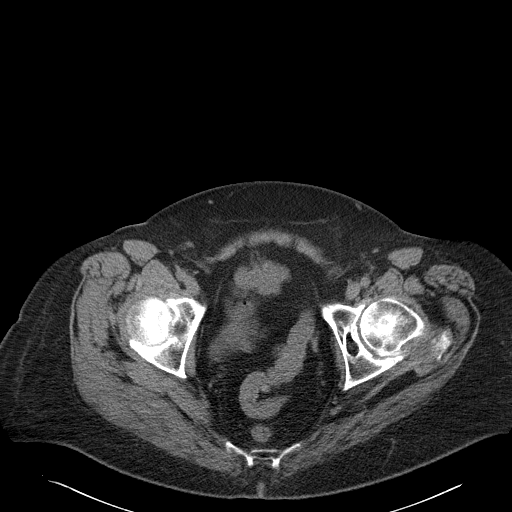
[im 24/82  soft-tissue]
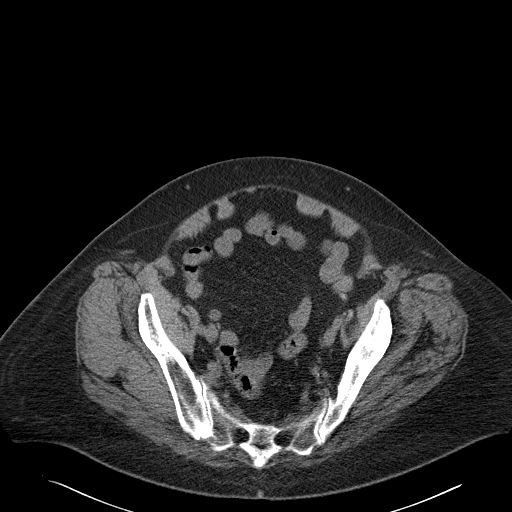
[im 29/82  soft-tissue]
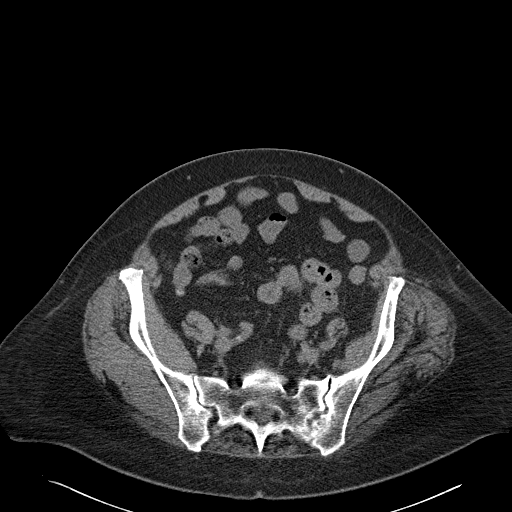
[im 34/82  soft-tissue]
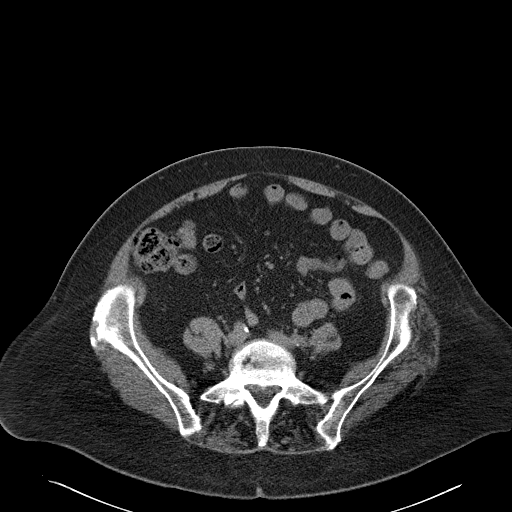
[im 42/82  soft-tissue]
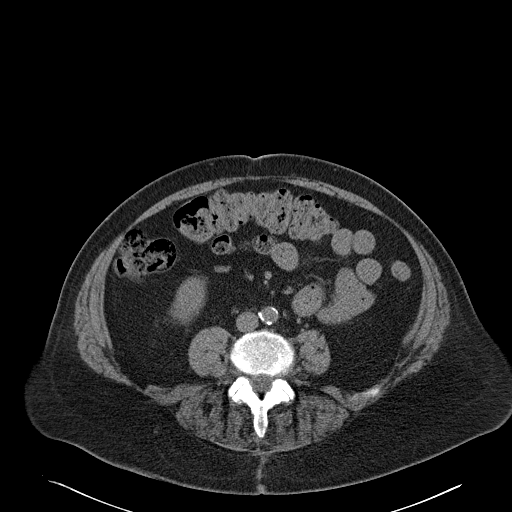
[im 48/82  soft-tissue]
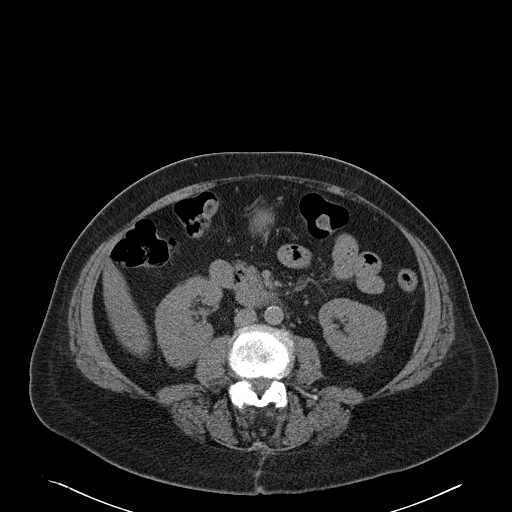
[im 53/82  soft-tissue]
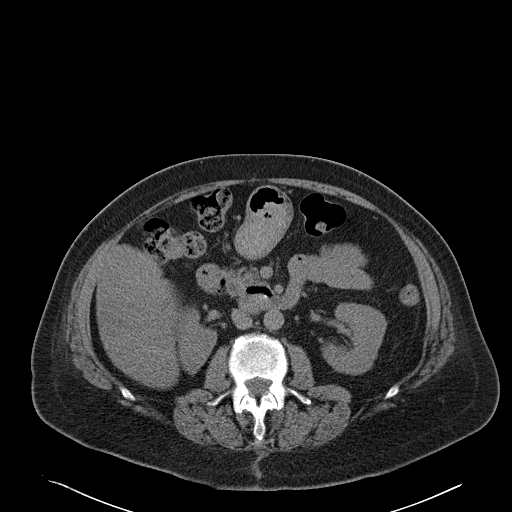
[im 53/82  bone]
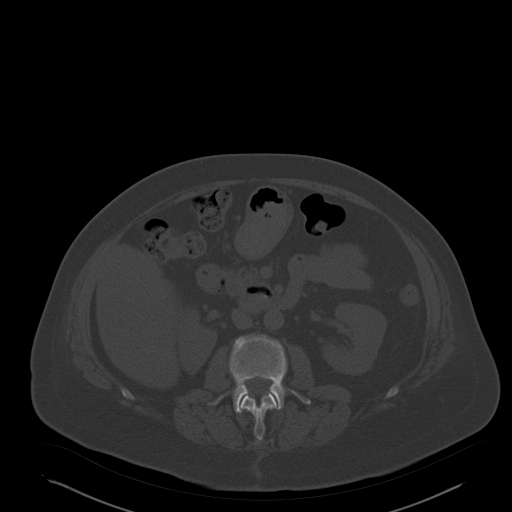
[im 58/82  soft-tissue]
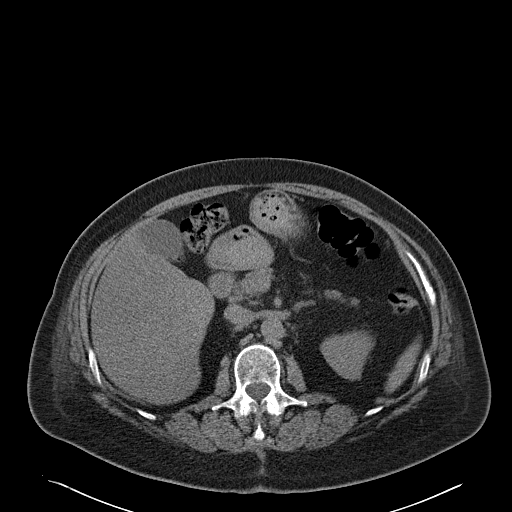
[im 66/82  soft-tissue]
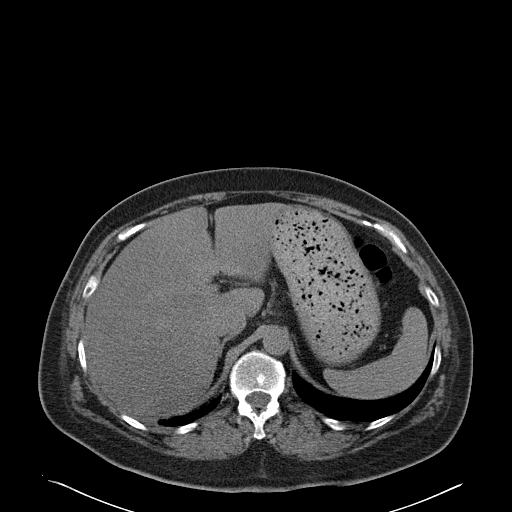
[im 71/82  soft-tissue]
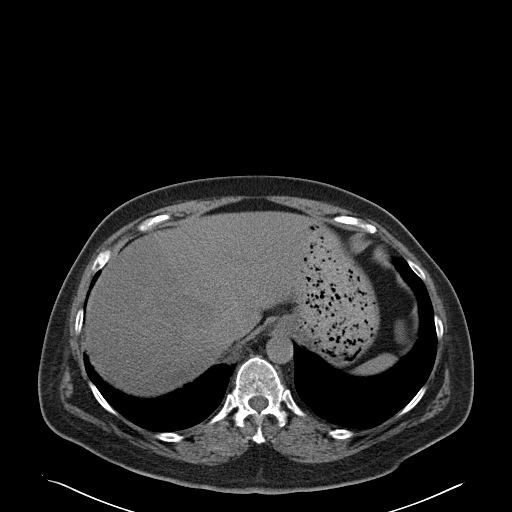
[im 76/82  soft-tissue]
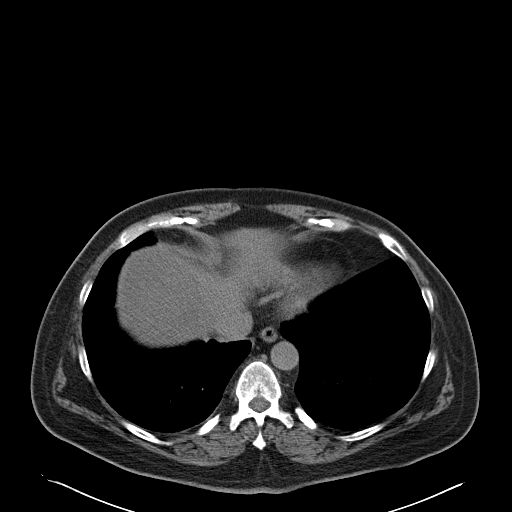

[Series 602: <mpr thick range> · coronal · 0.81mm/px · 3 of 97 slices shown]
[im 33/97  soft-tissue]
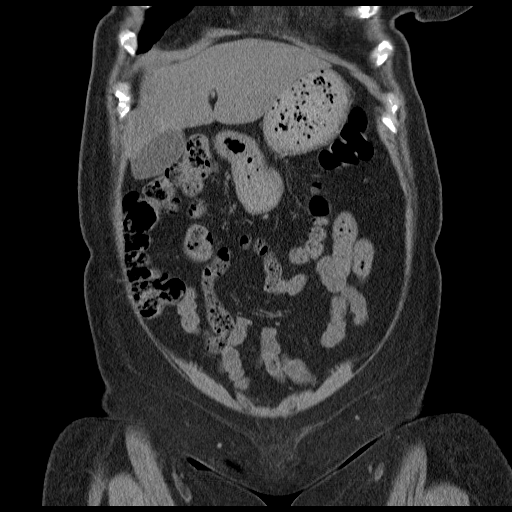
[im 43/97  soft-tissue]
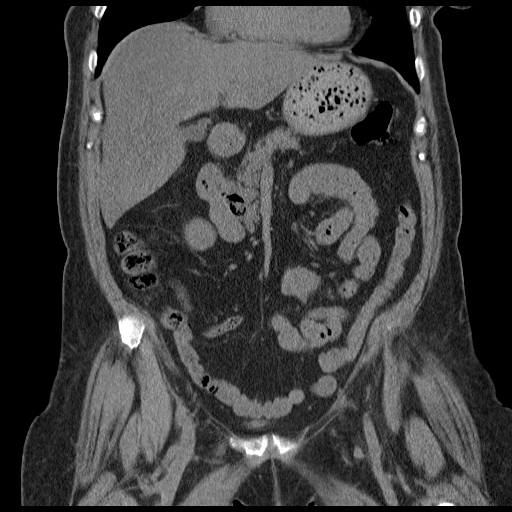
[im 54/97  soft-tissue]
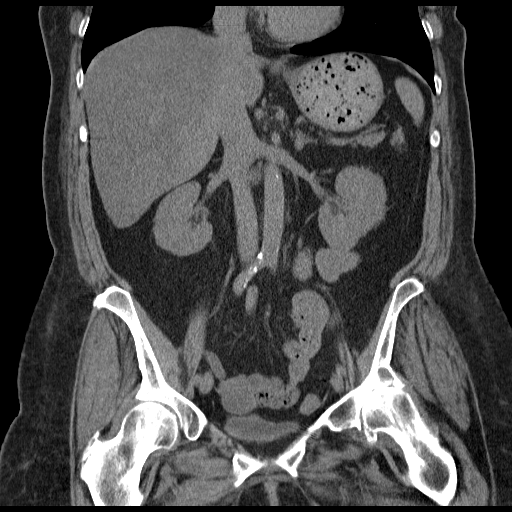

[16 of 46 positions shown; findings below may reference images not displayed]

FINDINGS: Lower chest: There is stable mild subpleural scarring in the right
middle lobe. No suspicious pulmonary nodules are demonstrated. There
is no pleural effusion. Prior right mastectomy with breast
prosthesis noted.

Hepatobiliary: Severe mildly heterogeneous hepatic steatosis is
again noted. No focal lesions are apparent on non contrast imaging.
No evidence of gallstones or gallbladder wall thickening. There is
mild biliary dilatation to 8 mm. This appears similar to the prior
examination.

Pancreas: The pancreas is atrophied. There is no evidence of
pancreatic mass or pancreatic ductal dilatation.

Spleen: Normal in size without focal abnormality.

Adrenals/Urinary Tract: Both adrenal glands appear normal.The
kidneys appear normal without evidence of urinary tract calculus or
hydronephrosis. No bladder abnormalities are seen.

Stomach/Bowel: No evidence of bowel wall thickening, distention or
surrounding inflammatory change.There is prominent ingested material
within the stomach. Proximal duodenal diverticuli appear stable. The
appendix appears normal. There is mild colonic diverticulosis.

Vascular/Lymphatic: There are no enlarged abdominal or pelvic lymph
nodes. Mild aortoiliac atherosclerosis appears stable.

Reproductive: No evidence of pelvic mass or inflammatory process.
The uterus and ovaries appear unchanged.

Other: No evidence of abdominal wall mass or hernia.

Musculoskeletal: No acute or significant osseous findings. Sclerotic
lesion in the left sacral ala is unchanged. There is stable
degenerative disc disease at L3-4 and advanced asymmetric left hip
arthropathy. There is associated left pelvic muscular atrophy.
IMPRESSION: 1. No evidence of urinary tract calculus or hydronephrosis.
2. No evidence of metastatic disease.
3. Stable heterogeneous hepatic steatosis, mild extrahepatic biliary
dilatation, proximal duodenal diverticula and degenerative changes.

## 2016-01-04 MED ORDER — LETROZOLE 2.5 MG PO TABS: 2.5000 mg | ORAL_TABLET | Freq: Every day | ORAL | Status: DC

## 2016-01-08 ENCOUNTER — Other Ambulatory Visit: Payer: Self-pay | Admitting: *Deleted

## 2016-01-08 ENCOUNTER — Telehealth: Payer: Self-pay | Admitting: Oncology

## 2016-01-08 NOTE — Telephone Encounter (Signed)
spoke with pt confirmed added lab on 7/25

## 2016-01-24 IMAGING — CT CT ABD-PELV W/ CM
2 of 5 series · 16 of 46 positions shown, 18 images · IV contrast (OMNIPAQUE)
Comparison: [DATE].  Stone study dated 06/22/2014.

CLINICAL DATA: Metastatic right breast cancer. Diagnosed 6557 with
recurrence in 8406.

EXAM:
CT CHEST, ABDOMEN, AND PELVIS WITH CONTRAST
TECHNIQUE: Multidetector CT imaging of the chest, abdomen and pelvis was
performed following the standard protocol during bolus
administration of intravenous contrast.
CONTRAST:  100mL OMNIPAQUE IOHEXOL 300 MG/ML  SOLN

[Series 2: cap with st · axial · 0.79mm/px · z∈[-640,-86]mm · 13 of 127 slices shown, 15 images]
[im 8/127  soft-tissue]
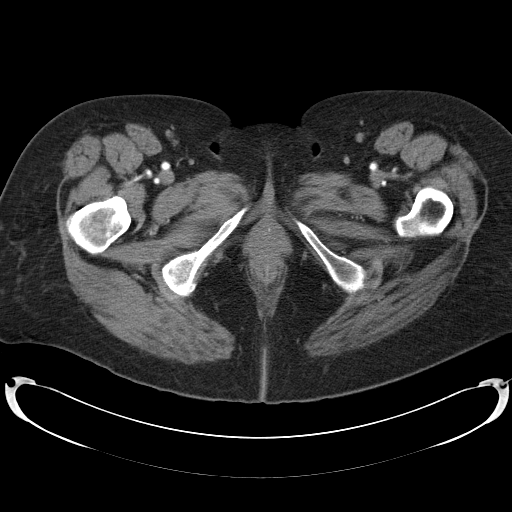
[im 8/127  bone]
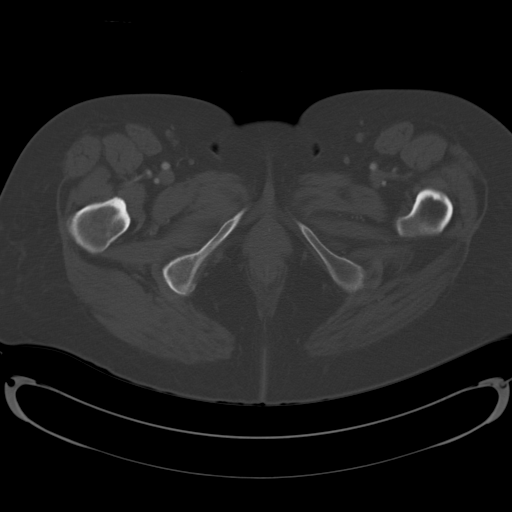
[im 15/127  soft-tissue]
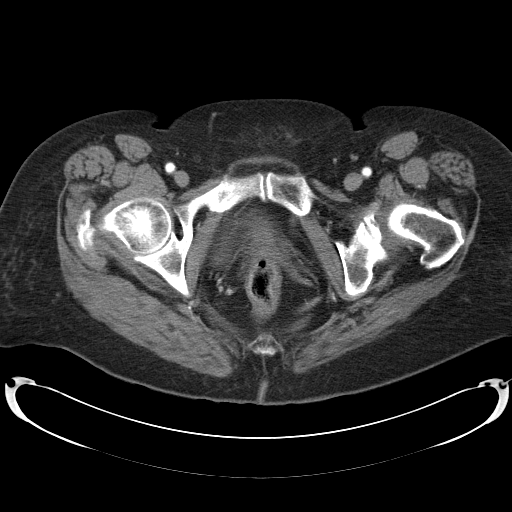
[im 30/127  soft-tissue]
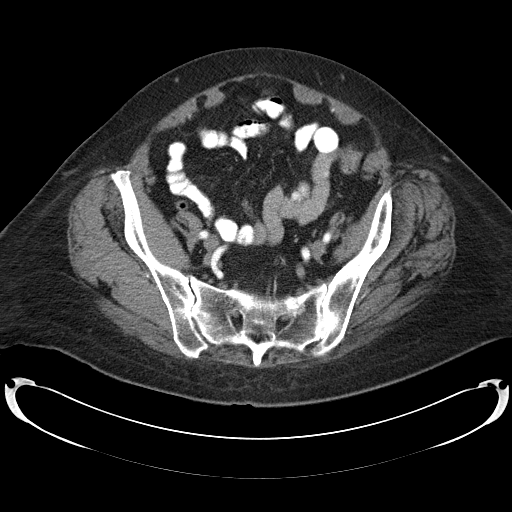
[im 38/127  soft-tissue]
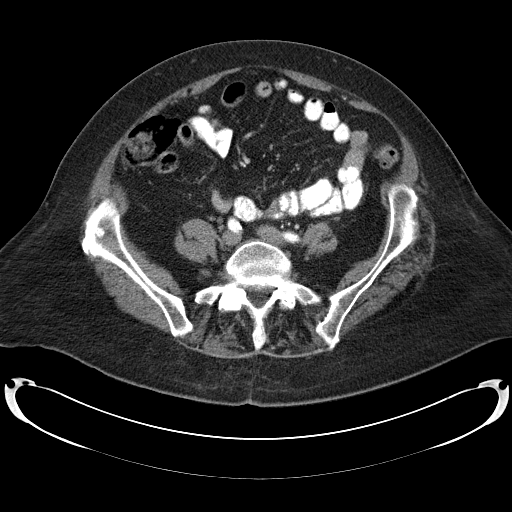
[im 45/127  soft-tissue]
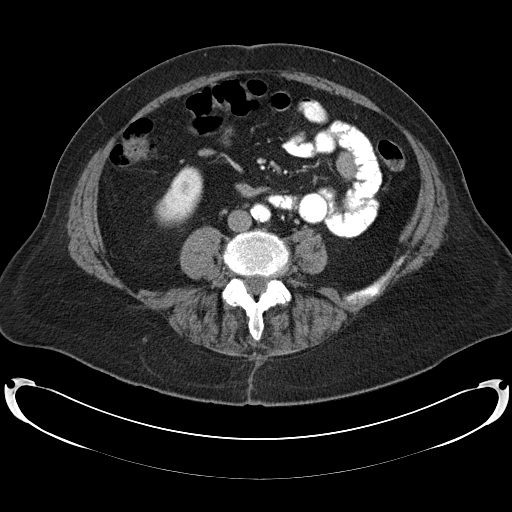
[im 52/127  soft-tissue]
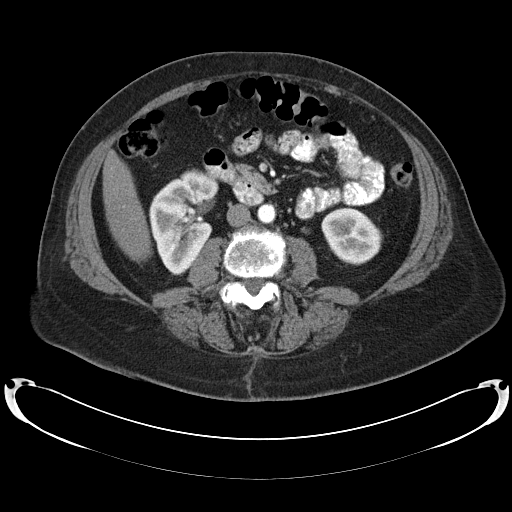
[im 67/127  soft-tissue]
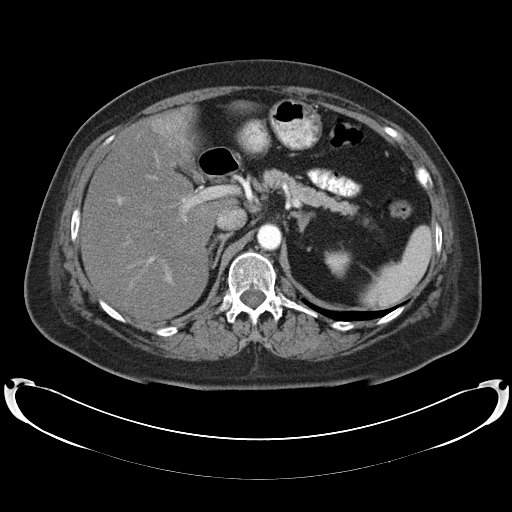
[im 75/127  soft-tissue]
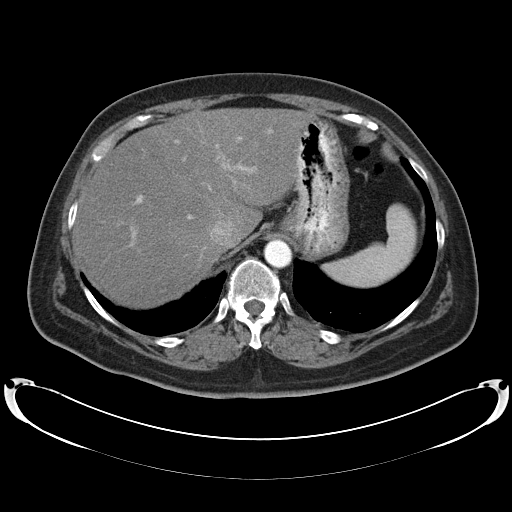
[im 82/127  soft-tissue]
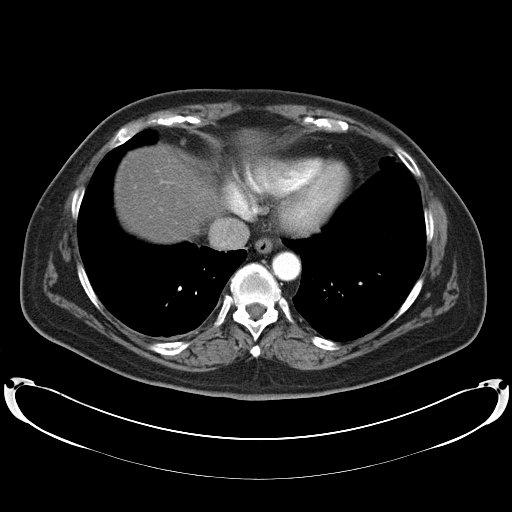
[im 82/127  bone]
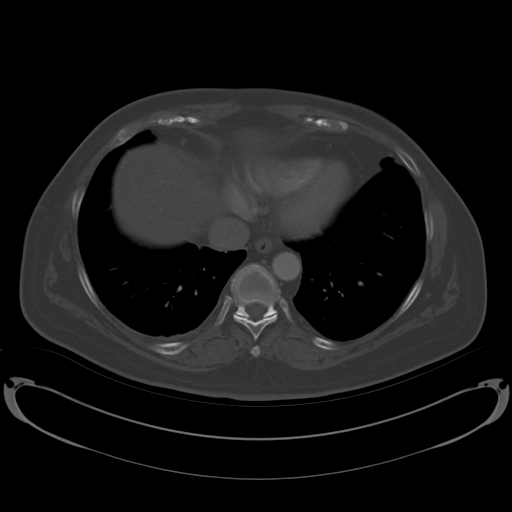
[im 89/127  soft-tissue]
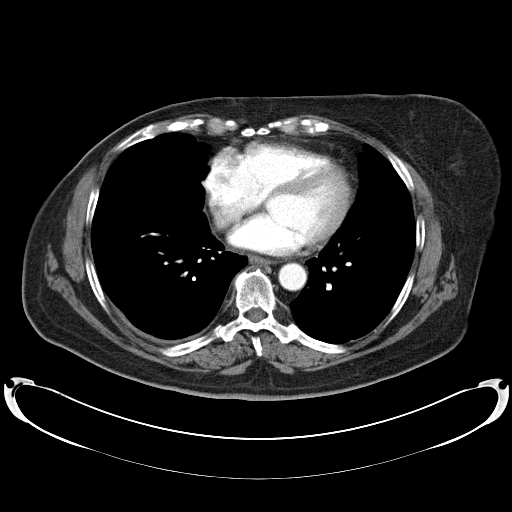
[im 97/127  soft-tissue]
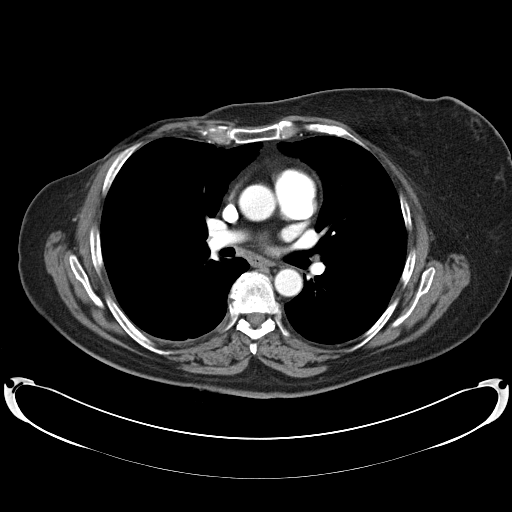
[im 112/127  soft-tissue]
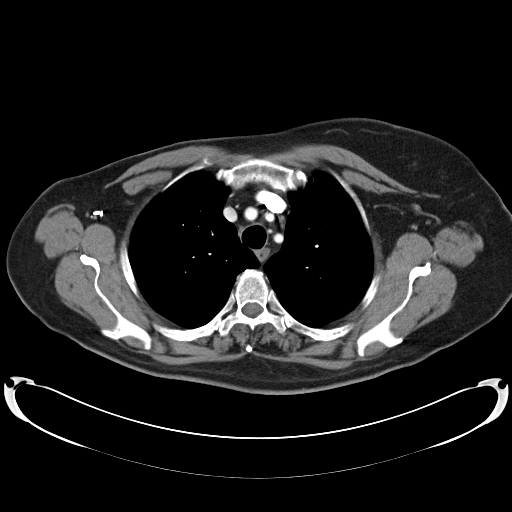
[im 119/127  soft-tissue]
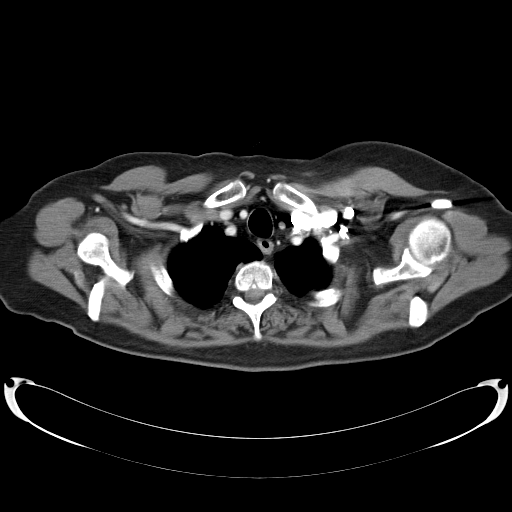

[Series 602: <mpr thick range> · coronal · 1.24mm/px · 3 of 91 slices shown]
[im 31/91  soft-tissue]
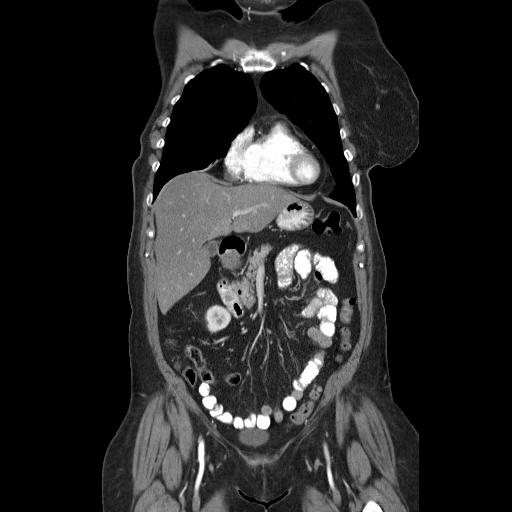
[im 41/91  soft-tissue]
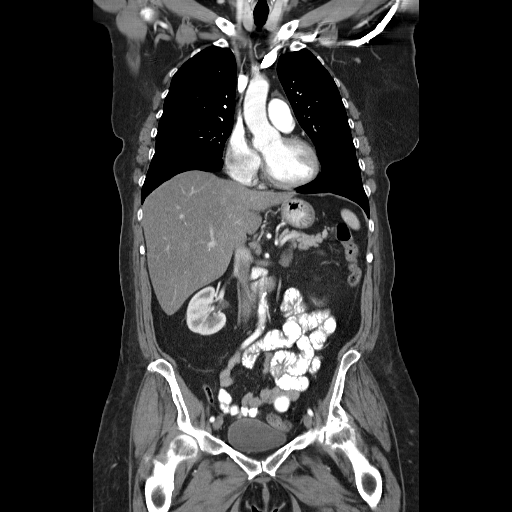
[im 51/91  soft-tissue]
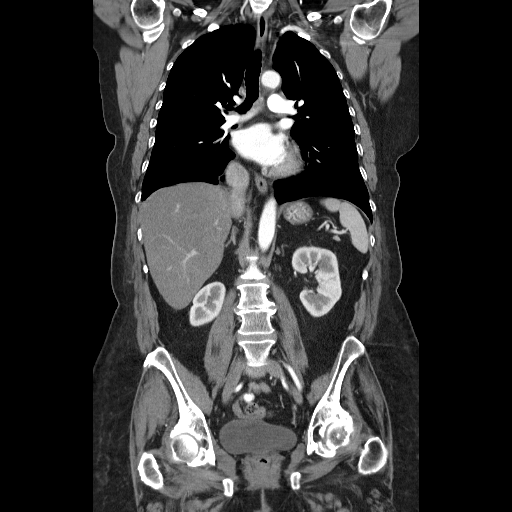

[16 of 46 positions shown; findings below may reference images not displayed]

FINDINGS: RECIST

Target Lesions:

1. Right lower lobe pulmonary nodule which is unchanged on image 40.
6 mm today versus 6 mm on the prior.
2. Partially calcified subcarinal node which is unchanged at 6 mm on
image 29.
3. Right hilar node which measures 9 mm on image 29 versus 8 mm on
the prior.
Non-target Lesions:

None

CT CHEST FINDINGS

Lungs/Pleura: Subpleural right lower lobe pulmonary nodule which
measures 6 mm on image 40 of series 4. Similar to on the prior.
Clear left lung. Trace right pleural fluid or thickening, similar.

Heart/Mediastinum: No supraclavicular adenopathy. Right mastectomy
and axillary node dissection. No axillary adenopathy. Normal heart
size, without pericardial effusion. No central pulmonary embolism,
on this non-dedicated study. No mediastinal or hilar adenopathy.

CT ABDOMEN AND PELVIS FINDINGS

Hepatobiliary: Moderate hepatic steatosis. Non masslike relative
hyperattenuation in the right lobe on image 55 is similar and may
represent sparing of steatosis. There is also sparing adjacent the
gallbladder. Normal gallbladder, without biliary ductal dilatation.

Pancreas: Mild pancreatic atrophy

Spleen: Normal

Adrenals/Urinary tract: Normal adrenal glands. Normal kidneys,
without hydronephrosis. Normal urinary bladder.

Stomach/Bowel: Normal stomach, without wall thickening. Transverse
duodenal diverticulum. Normal colon, appendix, and terminal ileum.
Otherwise normal small bowel.

Vascular/Lymphatic: Aortic and branch vessel atherosclerosis. No
abdominopelvic adenopathy.

Reproductive: Normal uterus and adnexa.

Other:  No significant free fluid.

Musculoskeletal: Left hip osteoarthritis which is advanced. Left
sacral sclerotic lesion is not significantly changed. Vague area of
left iliac wing sclerosis is also similar.

Eleventh left rib posterior medial sclerosis is not significantly
changed. This may be posttraumatic.
IMPRESSION: CT CHEST IMPRESSION

1. No extraosseous metastasis within the chest.
2. Right mastectomy and axillary node dissection.

CT ABDOMEN AND PELVIS IMPRESSION

1. No extraosseous metastasis within the abdomen or pelvis.
2. Sclerotic lesions within the left sacrum and iliac wing are
similar and remains suspicious for osseous metastasis.
3. Heterogeneous hepatic steatosis.

## 2016-01-30 ENCOUNTER — Other Ambulatory Visit: Payer: Self-pay | Admitting: Nurse Practitioner

## 2016-02-07 DIAGNOSIS — Z471 Aftercare following joint replacement surgery: Secondary | ICD-10-CM | POA: Diagnosis not present

## 2016-02-07 DIAGNOSIS — Z96642 Presence of left artificial hip joint: Secondary | ICD-10-CM | POA: Diagnosis not present

## 2016-02-08 ENCOUNTER — Other Ambulatory Visit: Payer: Self-pay | Admitting: *Deleted

## 2016-02-08 DIAGNOSIS — C7800 Secondary malignant neoplasm of unspecified lung: Principal | ICD-10-CM

## 2016-02-08 DIAGNOSIS — C50911 Malignant neoplasm of unspecified site of right female breast: Secondary | ICD-10-CM

## 2016-02-08 MED ORDER — LETROZOLE 2.5 MG PO TABS: 2.5000 mg | ORAL_TABLET | Freq: Every day | ORAL | Status: DC

## 2016-02-20 ENCOUNTER — Encounter (HOSPITAL_COMMUNITY): Payer: Self-pay

## 2016-02-20 ENCOUNTER — Other Ambulatory Visit (HOSPITAL_BASED_OUTPATIENT_CLINIC_OR_DEPARTMENT_OTHER): Payer: Medicare Other

## 2016-02-20 ENCOUNTER — Ambulatory Visit (HOSPITAL_COMMUNITY)
Admission: RE | Admit: 2016-02-20 | Discharge: 2016-02-20 | Disposition: A | Discharge status: Home / Self Care | Payer: Medicare Other | Source: Ambulatory Visit | Attending: Oncology | Admitting: Oncology

## 2016-02-20 DIAGNOSIS — C50911 Malignant neoplasm of unspecified site of right female breast: Secondary | ICD-10-CM | POA: Insufficient documentation

## 2016-02-20 DIAGNOSIS — Z006 Encounter for examination for normal comparison and control in clinical research program: Secondary | ICD-10-CM

## 2016-02-20 DIAGNOSIS — C50411 Malignant neoplasm of upper-outer quadrant of right female breast: Secondary | ICD-10-CM | POA: Diagnosis present

## 2016-02-20 DIAGNOSIS — R918 Other nonspecific abnormal finding of lung field: Secondary | ICD-10-CM | POA: Insufficient documentation

## 2016-02-20 DIAGNOSIS — C7800 Secondary malignant neoplasm of unspecified lung: Secondary | ICD-10-CM | POA: Insufficient documentation

## 2016-02-20 LAB — CBC WITH DIFFERENTIAL/PLATELET
BASO%: 1.1 % (ref 0.0–2.0)
BASOS ABS: 0.1 10*3/uL (ref 0.0–0.1)
EOS ABS: 0.2 10*3/uL (ref 0.0–0.5)
EOS%: 2.3 % (ref 0.0–7.0)
HEMATOCRIT: 43.7 % (ref 34.8–46.6)
HGB: 14.3 g/dL (ref 11.6–15.9)
LYMPH%: 16.3 % (ref 14.0–49.7)
MCH: 25.7 pg (ref 25.1–34.0)
MCHC: 32.8 g/dL (ref 31.5–36.0)
MCV: 78.3 fL — ABNORMAL LOW (ref 79.5–101.0)
MONO#: 0.7 10*3/uL (ref 0.1–0.9)
MONO%: 10.2 % (ref 0.0–14.0)
NEUT#: 4.8 10*3/uL (ref 1.5–6.5)
NEUT%: 70.1 % (ref 38.4–76.8)
PLATELETS: 273 10*3/uL (ref 145–400)
RBC: 5.58 10*6/uL — ABNORMAL HIGH (ref 3.70–5.45)
RDW: 14.2 % (ref 11.2–14.5)
WBC: 6.9 10*3/uL (ref 3.9–10.3)
lymph#: 1.1 10*3/uL (ref 0.9–3.3)

## 2016-02-20 LAB — COMPREHENSIVE METABOLIC PANEL
ALBUMIN: 3.5 g/dL (ref 3.5–5.0)
ALK PHOS: 261 U/L — AB (ref 40–150)
ALT: 37 U/L (ref 0–55)
ANION GAP: 10 meq/L (ref 3–11)
AST: 47 U/L — ABNORMAL HIGH (ref 5–34)
BUN: 22.2 mg/dL (ref 7.0–26.0)
CALCIUM: 10.1 mg/dL (ref 8.4–10.4)
CHLORIDE: 107 meq/L (ref 98–109)
CO2: 22 mEq/L (ref 22–29)
CREATININE: 1 mg/dL (ref 0.6–1.1)
EGFR: 60 mL/min/{1.73_m2} — ABNORMAL LOW (ref >90)
Glucose: 139 mg/dl (ref 70–140)
POTASSIUM: 4 meq/L (ref 3.5–5.1)
Sodium: 139 mEq/L (ref 136–145)
Total Bilirubin: 0.34 mg/dL (ref 0.20–1.20)
Total Protein: 8.2 g/dL (ref 6.4–8.3)

## 2016-02-20 MED ORDER — IOPAMIDOL (ISOVUE-300) INJECTION 61%
75.0000 mL | Freq: Once | INTRAVENOUS | Status: AC | PRN
  Administered 2016-02-20: 75 mL via INTRAVENOUS

## 2016-02-21 ENCOUNTER — Other Ambulatory Visit: Payer: Self-pay | Admitting: Oncology

## 2016-02-21 DIAGNOSIS — Z1231 Encounter for screening mammogram for malignant neoplasm of breast: Secondary | ICD-10-CM

## 2016-02-22 ENCOUNTER — Telehealth: Payer: Self-pay | Admitting: Oncology

## 2016-02-22 ENCOUNTER — Encounter: Payer: Self-pay | Admitting: *Deleted

## 2016-02-22 ENCOUNTER — Ambulatory Visit (HOSPITAL_BASED_OUTPATIENT_CLINIC_OR_DEPARTMENT_OTHER): Payer: Medicare Other | Admitting: Oncology

## 2016-02-22 ENCOUNTER — Other Ambulatory Visit: Payer: Self-pay | Admitting: *Deleted

## 2016-02-22 ENCOUNTER — Other Ambulatory Visit: Payer: Medicare Other

## 2016-02-22 VITALS — BP 109/70 | HR 84 | Temp 98.1°F | Resp 18 | Ht 64.0 in | Wt 161.9 lb

## 2016-02-22 DIAGNOSIS — I313 Pericardial effusion (noninflammatory): Secondary | ICD-10-CM

## 2016-02-22 DIAGNOSIS — C7800 Secondary malignant neoplasm of unspecified lung: Secondary | ICD-10-CM

## 2016-02-22 DIAGNOSIS — I3131 Malignant pericardial effusion in diseases classified elsewhere: Secondary | ICD-10-CM

## 2016-02-22 DIAGNOSIS — R748 Abnormal levels of other serum enzymes: Secondary | ICD-10-CM | POA: Diagnosis not present

## 2016-02-22 DIAGNOSIS — C773 Secondary and unspecified malignant neoplasm of axilla and upper limb lymph nodes: Secondary | ICD-10-CM

## 2016-02-22 DIAGNOSIS — C7951 Secondary malignant neoplasm of bone: Secondary | ICD-10-CM

## 2016-02-22 DIAGNOSIS — C50911 Malignant neoplasm of unspecified site of right female breast: Secondary | ICD-10-CM

## 2016-02-22 DIAGNOSIS — C50411 Malignant neoplasm of upper-outer quadrant of right female breast: Secondary | ICD-10-CM | POA: Diagnosis not present

## 2016-02-22 DIAGNOSIS — Z79811 Long term (current) use of aromatase inhibitors: Secondary | ICD-10-CM

## 2016-02-22 DIAGNOSIS — Z006 Encounter for examination for normal comparison and control in clinical research program: Secondary | ICD-10-CM

## 2016-02-22 DIAGNOSIS — C801 Malignant (primary) neoplasm, unspecified: Secondary | ICD-10-CM

## 2016-02-22 NOTE — Progress Notes (Signed)
02/22/16 at 11:03am - [employer]/[facility] 4 extension visit #2.  The pt was into the cancer center this morning for her visit #2 in the extension phase of the trial.  The pt reports that she is feeling great and denies any new complaints.  She reports the following AE's as ongoing:  Hot flashes, memory impairment, fatigue, nail changes, dry skin, and back pain.  She reports that she is playing golf on a regular basis.  She is also enjoying taking long walks with her new puppy, Cappie.  She was seen and examined by Dr. Jana Hakim.  He reviewed her labs and imaging.  He stated that the pt's CT chest is "unremarkable".  He stated that in his opinion she is still receiving benefit from her study treatment.  He stated that he did not wished to make any changes to her current treatment plan.  The pt returned her 6 kits of study drug, everolimus, to the study nurse today. The pt only returned 2 unopened pills of everolimus.  The pt should have taken 168 pills but she returned 2 pills.  The pt said that she must have marked her calendar, but then forgot to take her pills 1 day.  The pt is 98.8% compliant with her study drug.  The pt was dispensed 6 kits of study drug today to last her for 12 weeks.  THe pt confirmed that she is taking her letrozole 2.5 mg daily with her everolimus.  The pt is aware of her new appointment on 05/16/16.  Dr. Jana Hakim stated that since her scans are so stable, he is comfortable re-scanning her the end of December 2017.  The pt was in agreement with this plan.  The pt's ECOG =1.  The pt is able to do all of her normal activities with some limitations.   The pt's concomitant medications were reviewed with her, and she denies any new medications.   Brion Aliment RN, BSN. CCRP Clinical Research Nurse 02/22/2016 11:15 AM

## 2016-02-22 NOTE — Telephone Encounter (Signed)
appt made and avs printed °

## 2016-02-22 NOTE — Progress Notes (Signed)
[place]  [phone] Fax:(336) [phone]     ID: Barbara Parks DOB: 1950/09/15  MR#: [id number]  KGU#:[id number]  Patient Care Team: Darcus Austin, MD as PCP - General (Family Medicine) Jerline Pain, MD (Cardiology) Gaye Pollack, MD (Cardiothoracic Surgery) Chauncey Cruel, MD as Consulting Physician (Oncology) OTHER MD: Dr Erroll Luna- Merlene Laughter, DDS; Gaynelle Arabian MD  CHIEF COMPLAINT: metastatic breast cancer, on BOLERO study  CURRENT TREATMENT: letrozole + everolimus  BREAST CANCER HISTORY: From Dr. Collier [name]'s original intake note 04/13/2004:  "This woman has been in good health all of her life. She recently moved from [place] to work here.  She palpated a mass at about the 12 o'clock position in mid-July. She has not noticed any nipple retraction or skin changes.  She was seen by her primary care doctor who subsequently referred her for a mammogram.  Mammogram was performed on 03/01/04. This demonstrated a spiculated 2 cm mass at the 12 o'clock position in the right breast.  Upper outer quadrant of the left breast shows some distortion.   Physical exam at that time showed a firm, nontender nodule at the 12 o'clock position in the right breast, 5 cm from the nipple.  Ultrasound of this area showed a hypoechoic ill-defined mass, measuring 2.2 x 1.3 x 1.4 cm.  Physical exam of the left breast showed general vague thickening, upper outer quadrant of the left breast with a discrete palpable mass. The ultrasound performed showed a single hypoechoic ill-defined nodule at the 1 o'clock position, measuring 7 x 5 x 6 mm.  She had biopsies of both lesions on 03/02/03.  Needle core biopsy of the lesion on the right breast revealed invasive mammary carcinoma.  Needle core biopsy of the left breast showed a complex fibroadenoma.  Prognostic panel of the lesion on the right breast showed it to be ER positive at 73%, PR positive at 90% and proliferative index 9%, HER-2  was 1+.  Patient was referred to Dr. Annamaria Boots, who performed a simple mastectomy with sentinel lymph node evaluation on 03/22/04.  Final pathology showed this to be an invasive ductal carcinoma  with lobular features, measuring 2.2 cm, grade 2 of 3.  Margins negative for carcinoma.  Invasive ductal carcinoma was extended to involve deep dermis of the nipple.  Lymphovascular invasion was identified.  Total of 4 sentinel lymph nodes were evaluated.  Touch imprints at the time of the OR was felt to be negative.  Subsequent evaluation showed a 5 mm focus of metastatic carcinoma in one of the four lymph nodes on microscopic after sectioning.  There was extracapsular extension  of one lymph node as well. The remaining three lymph nodes were all negative."  The patient's subsequent history is detailed below.  INTERVAL HISTORY: Barbara Parks returns today for follow up of her stage IV breast cancer, accompanied by her research nurse, [name]. Miesha is currently on cycle 56 of the BOLERO-4 trial she continues on letrozole, which she tolerates well. She says her skin and nail surgery. That is her only side effect from that. She is also on everolimus, and she is not having any problems with pneumonitis, mucositis, or other side effects from this drug that she is aware of.  REVIEW OF SYSTEMS: Tifini continues to make progress after her left hip replacement. She does have some soreness in that area and she is doing stretching exercises. She says golf [place] and she shot a 40/9 holes last week. A detailed review  of systems today was otherwise noncontributory  PAST MEDICAL HISTORY: Past Medical History:  Diagnosis Date  . Arthritis    left hip  . Asthma   . breast ca 2005   breast/chemo R mastectomy  . GERD (gastroesophageal reflux disease)   . Hypercholesteremia   . Metastasis to lung ([place]) dx'd 08/2011  . Osteopenia due to cancer therapy 09/09/2013  . Palpitations   . Shortness of breath     PAST SURGICAL  HISTORY: Past Surgical History:  Procedure Laterality Date  . BREAST SURGERY  2005   right  . CATARACT EXTRACTION W/PHACO Left 01/18/2014   Procedure: CATARACT EXTRACTION PHACO AND INTRAOCULAR LENS PLACEMENT (IOC);  Surgeon: Elta Guadeloupe T. Gershon Crane, MD;  Location: [facility];  Service: Ophthalmology;  Laterality: Left;  CDE 15.79  . CATARACT EXTRACTION W/PHACO Right 02/08/2014   Procedure: CATARACT EXTRACTION PHACO AND INTRAOCULAR LENS PLACEMENT (IOC);  Surgeon: Elta Guadeloupe T. Gershon Crane, MD;  Location: [facility];  Service: Ophthalmology;  Laterality: Right;  CDE 4.41  . CHEST TUBE INSERTION  09/11/2011   Procedure: INSERTION PLEURAL DRAINAGE CATHETER;  Surgeon: Gaye Pollack, MD;  Location: [facility];  Service: Thoracic;  Laterality: Right;  . PERICARDIAL WINDOW  09/11/2011   Procedure: PERICARDIAL WINDOW;  Surgeon: Gaye Pollack, MD;  Location: [facility] OR;  Service: Thoracic;  Laterality: N/A;  . REMOVAL OF PLEURAL DRAINAGE CATHETER  12/19/2011   Procedure: REMOVAL OF PLEURAL DRAINAGE CATHETER;  Surgeon: Gaye Pollack, MD;  Location: [facility];  Service: Thoracic;  Laterality: Right;  TO BE DONE IN MINOR ROOM, SHORT STAY  . [place]  . TOTAL HIP ARTHROPLASTY Left 06/07/2015   Procedure: LEFT TOTAL HIP ARTHROPLASTY ANTERIOR APPROACH;  Surgeon: Gaynelle Arabian, MD;  Location: [facility];  Service: Orthopedics;  Laterality: Left;  Marland Kitchen VIDEO BRONCHOSCOPY  09/11/2011   Procedure: VIDEO BRONCHOSCOPY;  Surgeon: Gaye Pollack, MD;  Location: [facility] OR;  Service: Thoracic;  Laterality: N/A;    FAMILY HISTORY Family History  Problem Relation Age of Onset  . Cancer Mother     breast  . Heart disease Father   . Anesthesia problems Neg Hx    the patient's mother was diagnosed with breast cancer at age 17, she died age 71 from congestive heart failure..The patient's father died from heart disease at age 61.  She has one sister alive & well.  Two brothers alive & well, one with diabetes. The patient's sister was also diagnosed with breast  cancer, and was tested for the BRCA gene, and was negative. The patient herself has not been tested. There is no history of ovarian cancer in the family  GYNECOLOGIC HISTORY:  No LMP recorded. Patient is postmenopausal. Menarche age 50, the patient is GX P0. She stopped having periods with chemotherapy in 2005. 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 She was a Archivist. She is single, lives alone with her Shitzu-poodle Sammie.. Family is all in the [place] area.     ADVANCED DIRECTIVES: Not in place. At the 02/23/2014 visit the patient was given the appropriate documents to complete and notarize at her discretion. She tells me she is planning to name her sister, Barbara Parks, as healthcare power of attorney. [name] can be reached at [phone]   HEALTH MAINTENANCE: Social History  Substance Use Topics  . Smoking status: Former Smoker    Packs/day: 1.50  Years: 30.00    Types: Cigarettes    Quit date: 09/10/2007  . Smokeless tobacco: Not on file  . Alcohol use No     Colonoscopy:  PAP:  Bone density: 08/14/2015, T score -1.6  Lipid panel:  Allergies  Allergen Reactions  . Aspirin     REACTION: upset stomach  . Latex Other (See Comments)    Blistering and skin peels off  . Nsaids Nausea And Vomiting    Extreme nausea and vomiting    Current Outpatient Prescriptions  Medication Sig Dispense Refill  . cholecalciferol (VITAMIN D) 1000 units tablet Take 1,000 Units by mouth 2 (two) times daily.    Marland Kitchen gemfibrozil (LOPID) 600 MG tablet Take 1 tablet (600 mg total) by mouth 2 (two) times daily before a meal. 180 tablet 12  . Investigational everolimus (RAD001) 5 MG tablet Novartis [id number] Take 2 tablets by mouth daily. Take with a glass of water. 168 tablet 0  . letrozole (FEMARA) 2.5 MG tablet Take 1 tablet (2.5 mg total) by mouth daily. 90 tablet 3    . loperamide (IMODIUM) 2 MG capsule Take 2 mg by mouth 4 (four) times daily as needed for diarrhea or loose stools. Reported on 11/30/2015    . metoprolol tartrate (LOPRESSOR) 25 MG tablet Take 0.5 tablets (12.5 mg total) by mouth daily. 30 tablet 4  . omeprazole (PRILOSEC) 40 MG capsule Take 1 capsule (40 mg total) by mouth daily as needed. 90 capsule 3  . ondansetron (ZOFRAN) 8 MG tablet Take 1 tablet (8 mg total) by mouth 2 (two) times daily as needed (Nausea or vomiting). (Patient not taking: Reported on 09/20/2015) 30 tablet 1  . prochlorperazine (COMPAZINE) 25 MG suppository Place 1 suppository (25 mg total) rectally every 12 (twelve) hours as needed for nausea. (Patient not taking: Reported on 09/20/2015) 12 suppository 3   No current facility-administered medications for this visit.     OBJECTIVE: Middle-aged white woman In no acute distress Vitals:   02/22/16 1016 02/22/16 1017  BP: 130/70 109/70  Pulse:    Resp:    Temp:       Body mass index is 27.79 kg/m.    ECOG FS:1 - Symptomatic but completely ambulatory  Sclerae unicteric, pupils round and equal Oropharynx clear and moist-- no thrush or other lesions No cervical or supraclavicular adenopathy Lungs no rales or rhonchi Heart regular rate and rhythm Abd soft, nontender, positive bowel sounds MSK no focal spinal tenderness, no upper extremity lymphedema Neuro: nonfocal, well oriented, appropriate affect Breasts: Deferred   LAB RESULTS:  CMP     Component Value Date/Time   NA 139 02/20/2016 1057   K 4.0 02/20/2016 1057   CL 106 09/20/2015 1257   CL 109 (H) 12/31/2012 0940   CO2 22 02/20/2016 1057   GLUCOSE 139 02/20/2016 1057   GLUCOSE 127 (H) 12/31/2012 0940   BUN 22.2 02/20/2016 1057   CREATININE 1.0 02/20/2016 1057   CALCIUM 10.1 02/20/2016 1057   PROT 8.2 02/20/2016 1057   ALBUMIN 3.5 02/20/2016 1057   AST 47 (H) 02/20/2016 1057   ALT 37 02/20/2016 1057   ALKPHOS 261 (H) 02/20/2016 1057   BILITOT 0.34  02/20/2016 1057   GFRNONAA >60 09/20/2015 1257   GFRAA >60 09/20/2015 1257    I No results found for: SPEP  Lab Results  Component Value Date   WBC 6.9 02/20/2016   NEUTROABS 4.8 02/20/2016   HGB 14.3 02/20/2016   HCT 43.7 02/20/2016  MCV 78.3 (L) 02/20/2016   PLT 273 02/20/2016      Chemistry      Component Value Date/Time   NA 139 02/20/2016 1057   K 4.0 02/20/2016 1057   CL 106 09/20/2015 1257   CL 109 (H) 12/31/2012 0940   CO2 22 02/20/2016 1057   BUN 22.2 02/20/2016 1057   CREATININE 1.0 02/20/2016 1057      Component Value Date/Time   CALCIUM 10.1 02/20/2016 1057   ALKPHOS 261 (H) 02/20/2016 1057   AST 47 (H) 02/20/2016 1057   ALT 37 02/20/2016 1057   BILITOT 0.34 02/20/2016 1057       Lab Results  Component Value Date   LABCA2 58 (H) 09/14/2012    No components found for: [id number]  No results for input(s): INR in the last 168 hours.  Urinalysis    Component Value Date/Time   COLORURINE YELLOW 09/20/2015 1432   APPEARANCEUR HAZY (A) 09/20/2015 1432   LABSPEC >1.030 (H) 09/20/2015 1432   LABSPEC 1.030 07/13/2015 0841   PHURINE 5.5 09/20/2015 1432   GLUCOSEU 500 (A) 09/20/2015 1432   GLUCOSEU Negative 07/13/2015 0841   HGBUR MODERATE (A) 09/20/2015 1432   BILIRUBINUR NEGATIVE 09/20/2015 1432   BILIRUBINUR Negative 07/13/2015 0841   KETONESUR NEGATIVE 09/20/2015 1432   PROTEINUR 30 (A) 09/20/2015 1432   UROBILINOGEN 0.2 07/13/2015 0841   NITRITE NEGATIVE 09/20/2015 1432   LEUKOCYTESUR NEGATIVE 09/20/2015 1432   LEUKOCYTESUR Negative 07/13/2015 0841    STUDIES: Ct Chest W Contrast  Result Date: 02/20/2016 CLINICAL DATA:  RIGHT-sided breast cancer diagnosed 2005 and 2013. RIGHT mastectomy. Oral chemotherapy. Lung metastasis. Recist 1.0 protocol EXAM: CT CHEST WITH CONTRAST TECHNIQUE: Multidetector CT imaging of the chest, was performed following the standard protocol during bolus administration of intravenous contrast. CONTRAST:  43m  ISOVUE-300 IOPAMIDOL (ISOVUE-300) INJECTION 61% COMPARISON:  CT 11/28/2015 RECIST 1.0 Target Lesions: 1. RIGHT lower lobe nodule pulmonary nodule measures 5 mm (image 97, series 7). 2. Subcarinal lymph node measures 6 mm (image 69, series 2). 3. RIGHT hilar lymph node measures 0 mm Non-target Lesions: 1. Small RIGHT pleural effusion stable. 2. RIGHT upper lobe pulmonary nodule stable. FINDINGS: FINDINGS: CT CHEST FINDINGS Mediastinum/Nodes: No axillary or supraclavicular lymphadenopathy. Post RIGHT lymphadenectomy. No mediastinal or hilar lymphadenopathy. No pericardial fluid. Esophagus normal. Lungs/Pleura: Trace pleural thickening and fluid in the RIGHT hemi thorax. Stable RIGHT lower lobe pulmonary nodule measures 5 mm (image 97, series 7). Tiny subpleural 3 mm nodule in the RIGHT upper lobe (image 22, series 7) unchanged. No new pulmonary nodules. Musculoskeletal: No aggressive osseous lesion. Upper abdomen: : No focal hepatic lesion limited view of liver. There is thickening in the gallbladder fundus to 10 mm not changed from prior. IMPRESSION: 1. No evidence of breast cancer recurrence or progression. 2. Stable small RIGHT pulmonary nodules. 3. Stable thickening of the gallbladder fundus. Electronically Signed   By: SSuzy BouchardM.D.   On: 02/20/2016 16:04     ASSESSMENT: 65y.o. [place] woman with stage IV breast cancer, on BOLERO-4 trial  (1) status post right mastectomy and sentinel lymph node sampling 03/22/2004 for a right upper-outer quadrant pT2 pN1, stage IIB invasive ductal carcinoma with lobular features, grade 2, estrogen receptor and progesterone receptor positive, HER-2 negative, with an MIB-1 of 9% (([id number] [id number]  (2) addition all right axillary lymph node sampling 05/07/2004 showed 2 benign lymph nodes (4 lymph nodes previously removed, so total was one positive lymph node out of 6; [id number]  (  3) the patient was evaluated by radiation oncology; no postmastectomy  radiation recommended  (4) adjuvant chemotherapy with dose dense doxorubicin and cyclophosphamide x4 cycles (first cycle delayed one week) followed by dose dense paclitaxel x4 was completed 09/18/2004  (5) tamoxifen started March 2006, discontinued 2009  METASTATIC DISEASE: (6) presenting with a large pericardial effusion, large right pleural effusion and possible right middle lobe bronchial obstruction February 2013, status post pericardial window placement, fiberoptic bronchoscopy and right Pleurx placement 09/11/2011, with biopsy of the bronchus intermedius and pericardium positive for metastatic breast cancer, estrogen receptor 91% positive with moderate staining intensity, progesterone receptor 100% positive with strong staining intensity, with an MIB-1 of 35%, and no HER-2 amplification, the signals ratio being 1.37 ([id number])  (7) enrolled in BOLERO-4 trial 10/10/2011, receiving letrozole and everolimus  (a) two small areas of enhancement in the cerebellum noted by brain MRI 10/03/2011 were no longer apparent on repeat MRI 08/21/2012-- most recent brain MRI 04/01/2014 showed no evidence of intracranial metastatic disease  (b) sclerotic lesions in left iliac bone and sacrum have not been biopsied; stable; plan is to start zolendronate after patient updates her dental care (extraction planned)  (c) RLL lung nodule, stable (rescanned 07/12/2014 and 08/11/2014) R hilar and subcarinal lymph nodes: stable  (9) additional problems:  (a) hepatic steatosis  (b) COPD/ emphysema/ asthma  (c) advanced L hip osteoarthritis, status post left total hip replacement 06/07/2015  (d) aortoiliac atherosclerosis  (e) dental evaluation pending w possible dental extractions  (f) possible thalassemia trait  (10) Bone density concerns: DEXA scan 08/11/2013 was normal  (11) likely thalassemia trait  PLAN: Nicci continues to do remarkably well, clinically with no evidence of disease progression or  activity.  She has had an excellent recovery from her left hip surgery. She is tolerating the letrozole with minimal side effects, and she is having no symptoms that I can refer to her everolimus.  Her alkaline phosphatase has been trending up. This bears watching. I'm going to add a CA-27-29 to her next set of labs. We will start following that on an every three-month basis.  She will see me again in October. I will plan to repeat a CT scan of her chest in December.  She knows to call for any problems that may develop before her next visit here.  Chauncey Cruel, MD   02/22/2016 10:20 AM

## 2016-02-23 IMAGING — CT CT CHEST W/ CM
2 of 3 series · 15 of 36 positions shown, 18 images · IV contrast (OMNIPAQUE)
Comparison: CT 07/12/2014

CLINICAL DATA: Patient with history of metastatic breast cancer.

EXAM:
CT CHEST WITH CONTRAST
TECHNIQUE: Multidetector CT imaging of the chest was performed during
intravenous contrast administration.
CONTRAST:  80mL OMNIPAQUE IOHEXOL 300 MG/ML  SOLN

[Series 2: chest with st · axial · 0.67mm/px · z∈[-366,-70]mm · 12 of 71 slices shown, 15 images]
[im 6/71  mediastinal]
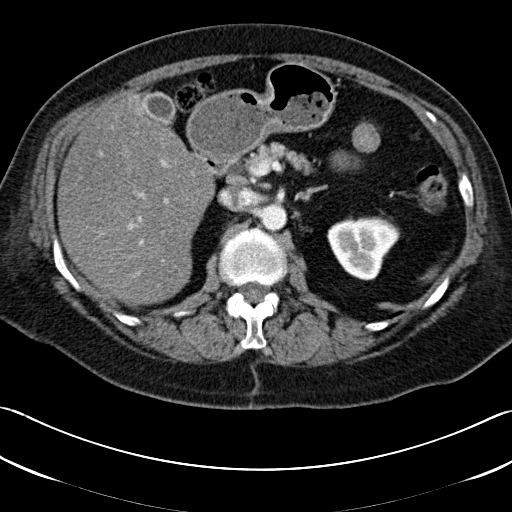
[im 6/71  lung]
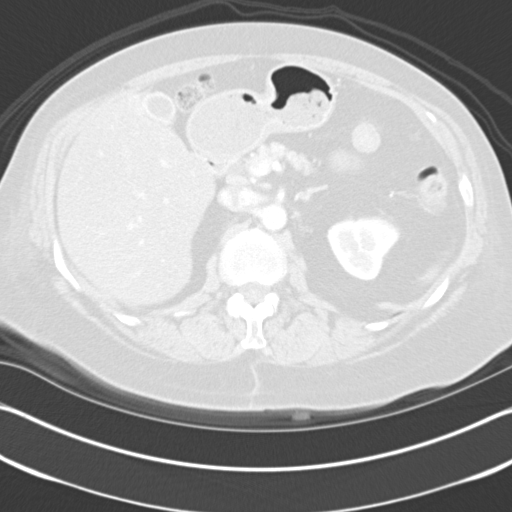
[im 11/71  lung]
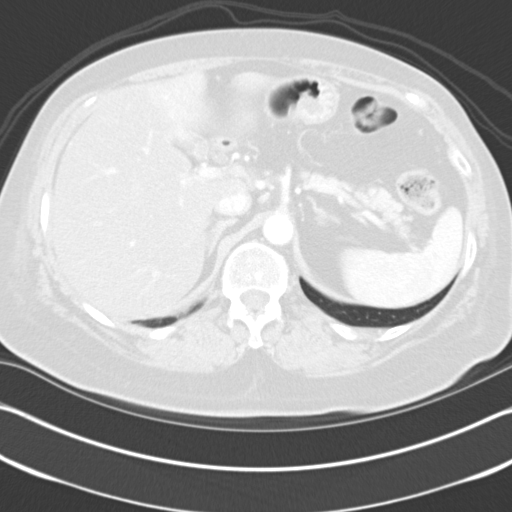
[im 16/71  lung]
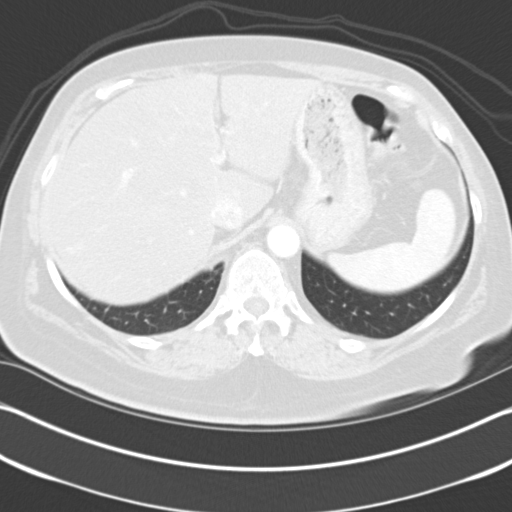
[im 21/71  lung]
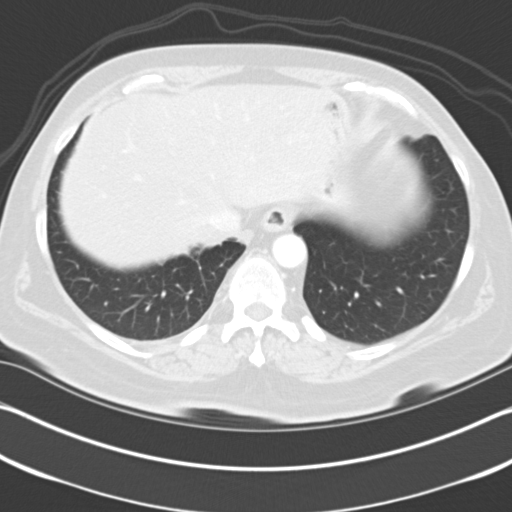
[im 26/71  mediastinal]
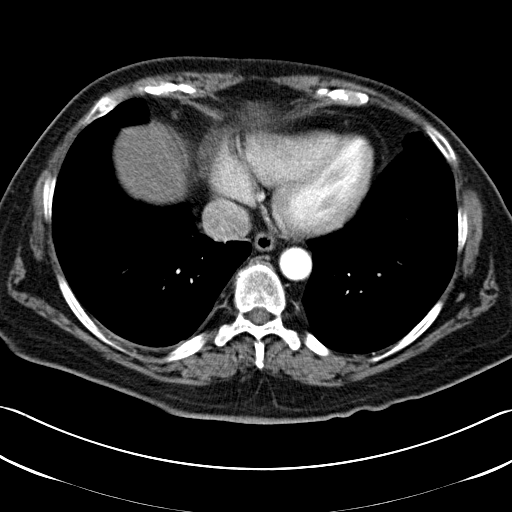
[im 26/71  lung]
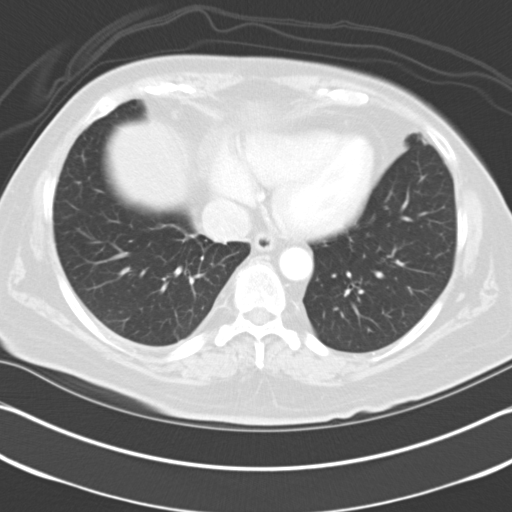
[im 32/71  lung]
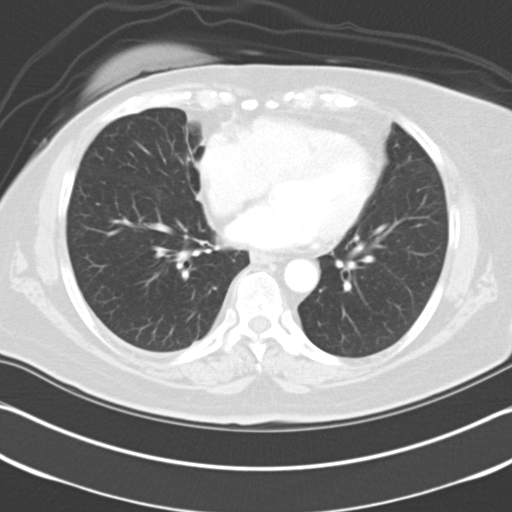
[im 39/71  lung]
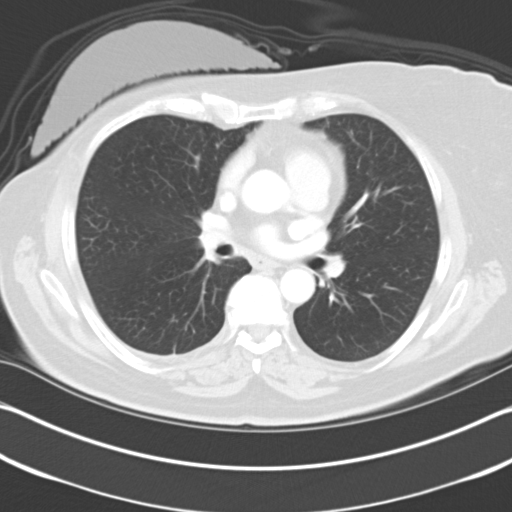
[im 45/71  lung]
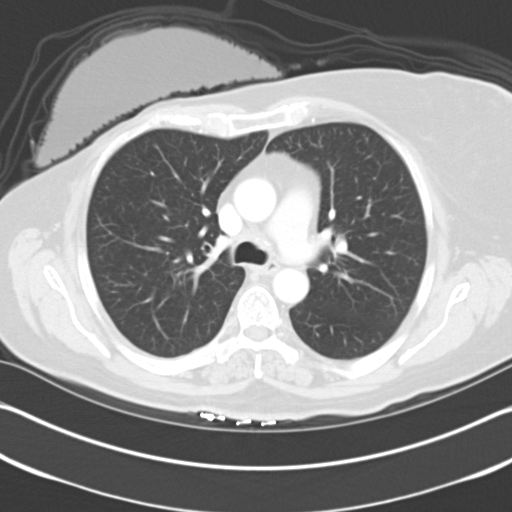
[im 50/71  mediastinal]
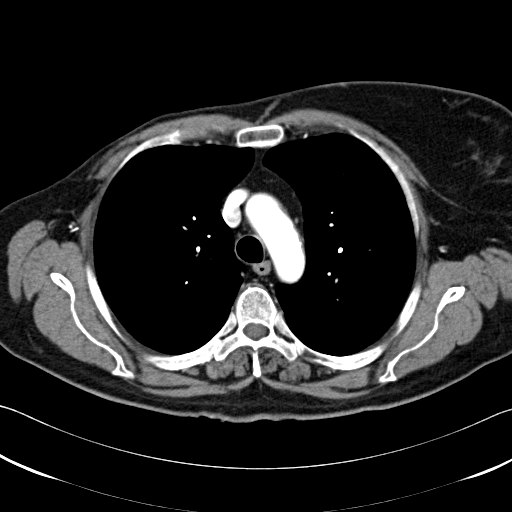
[im 50/71  lung]
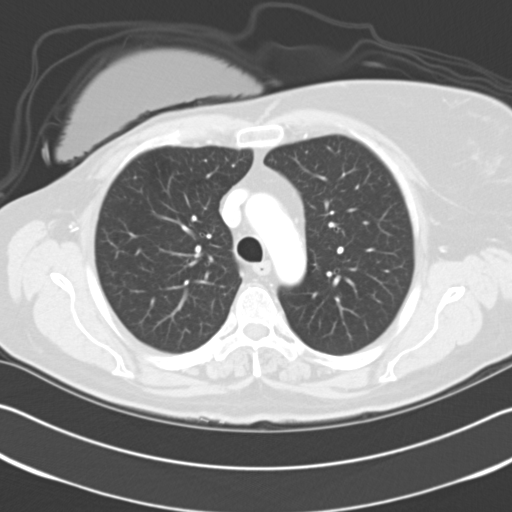
[im 55/71  lung]
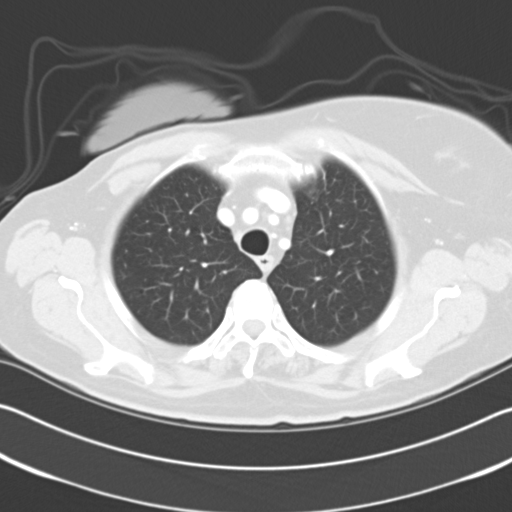
[im 60/71  lung]
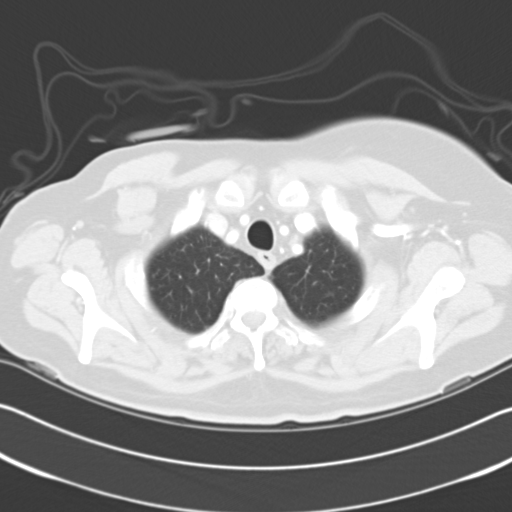
[im 65/71  lung]
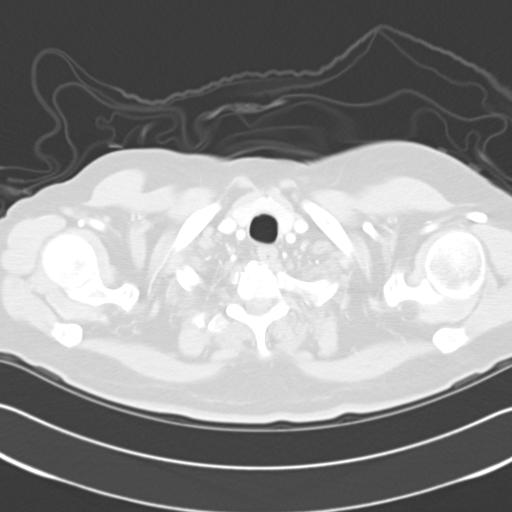

[Series 602: <mpr thick range> · coronal · 0.69mm/px · 3 of 80 slices shown]
[im 16/80  lung]
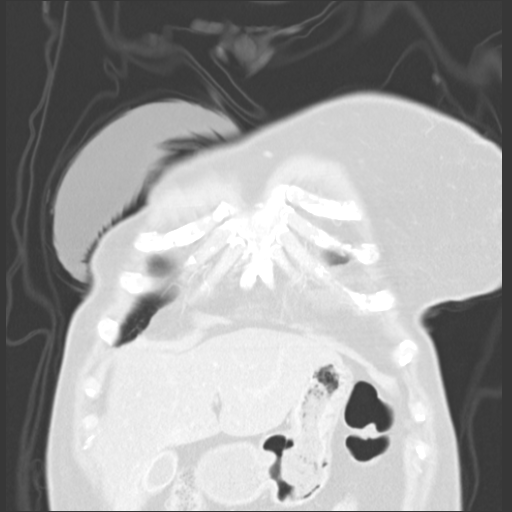
[im 32/80  lung]
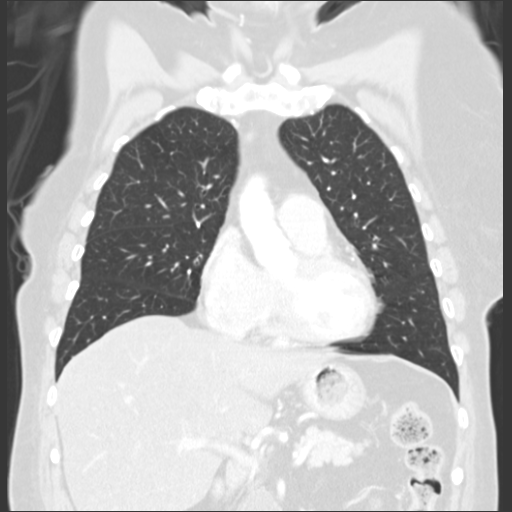
[im 48/80  lung]
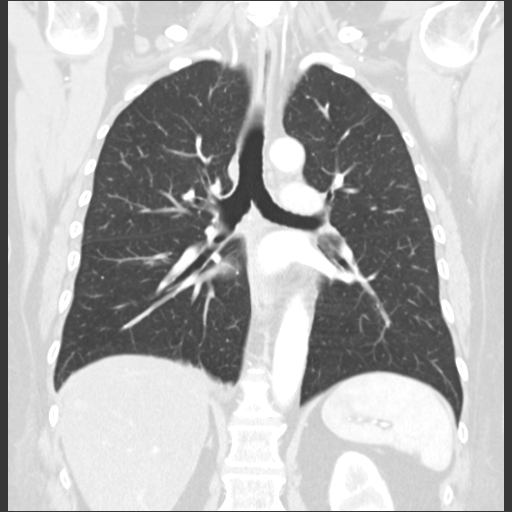

[15 of 36 positions shown; findings below may reference images not displayed]

FINDINGS: RECIST

Target Lesions:

1. Right lower lobe pulmonary nodule measuring 6 mm (image 43;
series 5), previously 6 mm.
2. Partially calcified subcarinal lymph node measuring 6 mm (image
31; series 2, previously 6 mm.
3. Right hilar lymph node measuring 9 mm (image 212; series 3),
previously 9 mm.
Non-target Lesions:

1. None
CT chest:

Visualized thyroid is unremarkable. Postsurgical change compatible
with right mastectomy. Normal heart size. No pericardial effusion.
Aorta and main pulmonary artery normal in caliber. Unchanged 9 mm
right hilar lymph node. Additionally 6 mm partially calcified
subcarinal lymph node is unchanged.

Central airways are patent. Stable subpleural 6 mm right lower lobe
pulmonary nodule (image 43; series 5). Left lung is clear. Trace
right pleural fluid versus thickening.

Visualization of the upper abdomen demonstrates unchanged non mass
like hyper attenuation within the right hepatic lobe, this is stable
from prior. Unchanged minimal nodularity left adrenal gland. No
aggressive or acute appearing osseous lesions.
IMPRESSION: Right mastectomy and axillary lymph node dissection.

Stable right lower lobe pulmonary nodule.

## 2016-02-26 ENCOUNTER — Encounter: Payer: Self-pay | Admitting: Hematology and Oncology

## 2016-02-26 MED ORDER — INV-EVEROLIMUS (RAD0001) 5MG TABLET NOVARTIS CRAD001Y24135: 2.0000 | ORAL_TABLET | Freq: Every day | ORAL | 0 refills | Status: DC

## 2016-03-21 IMAGING — CT CT CHEST W/ CM
2 of 5 series · 15 of 46 positions shown, 17 images · IV contrast (omnipaque)
Comparison: Chest CT of 08/11/2014. Chest, abdomen, and pelvic CTs
of 07/12/2014.

CLINICAL DATA: Restaging of right breast cancer diagnosed in 0661
in 3579. Right mastectomy. Ongoing oral chemotherapy. Left spine
surgery. Metastasis to brain and ribs.

EXAM:
CT CHEST, ABDOMEN, AND PELVIS WITH CONTRAST
TECHNIQUE: Multidetector CT imaging of the chest, abdomen and pelvis was
performed following the standard protocol during bolus
administration of intravenous contrast.
CONTRAST:  50mL OMNIPAQUE IOHEXOL 300 MG/ML SOLN, 100mL OMNIPAQUE
IOHEXOL 300 MG/ML SOLN

[Series 2: cap with st · axial · 0.81mm/px · z∈[-620,-60]mm · 12 of 128 slices shown, 14 images]
[im 8/128  soft-tissue]
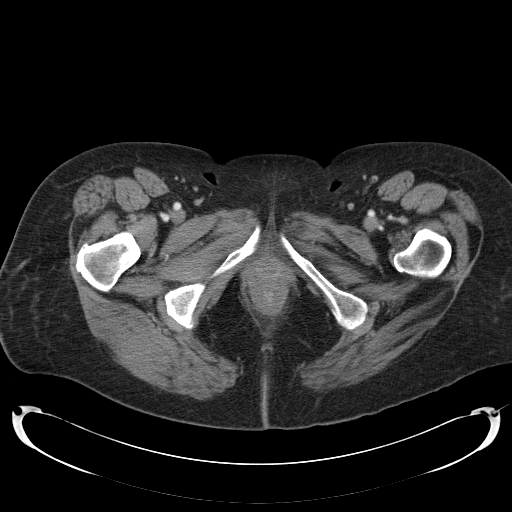
[im 8/128  bone]
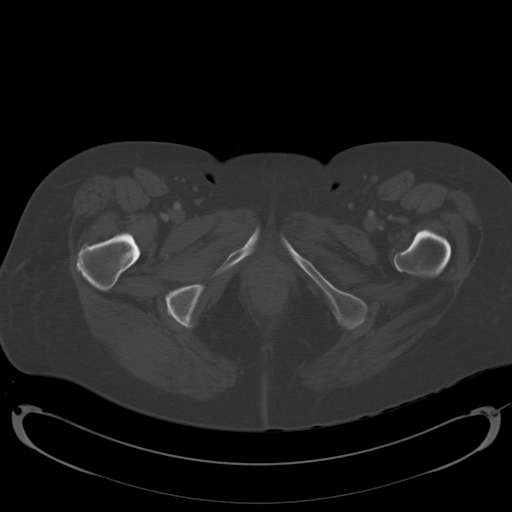
[im 23/128  soft-tissue]
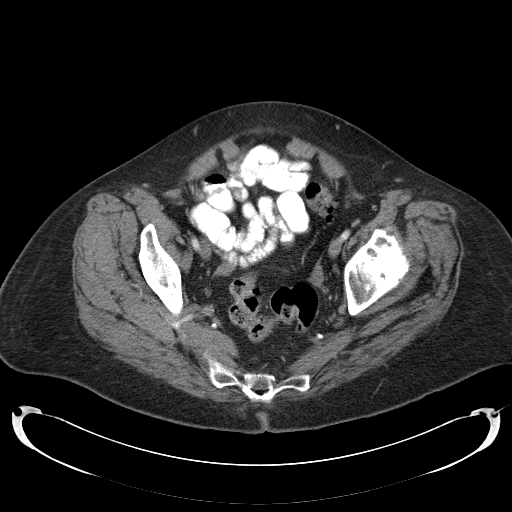
[im 30/128  soft-tissue]
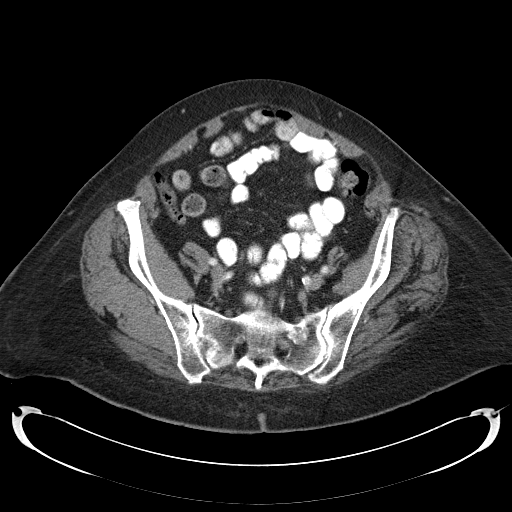
[im 38/128  soft-tissue]
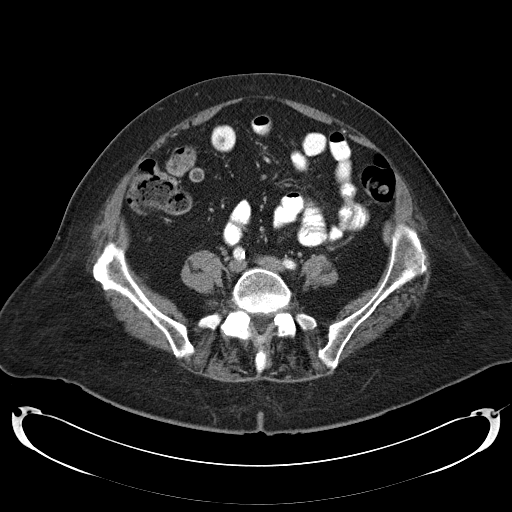
[im 53/128  soft-tissue]
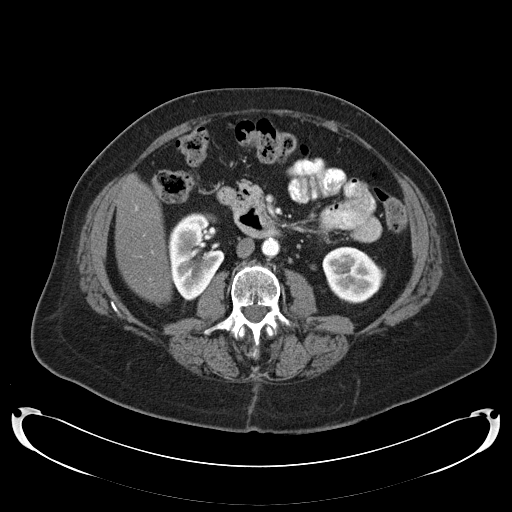
[im 60/128  soft-tissue]
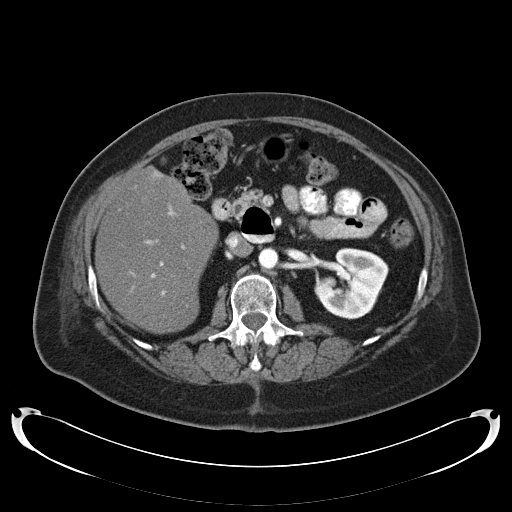
[im 68/128  soft-tissue]
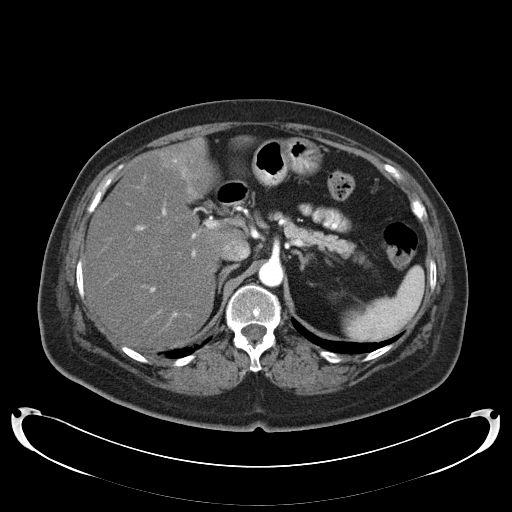
[im 83/128  soft-tissue]
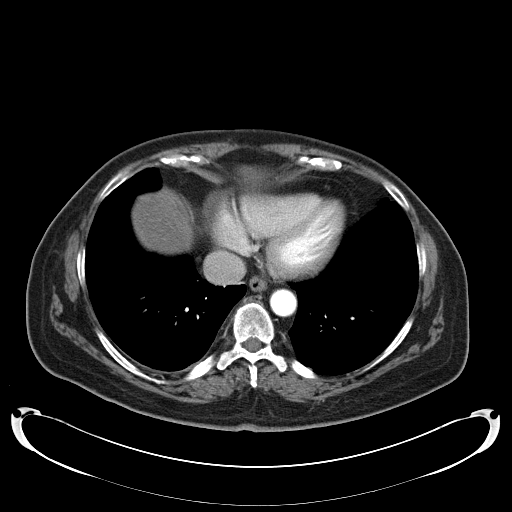
[im 90/128  soft-tissue]
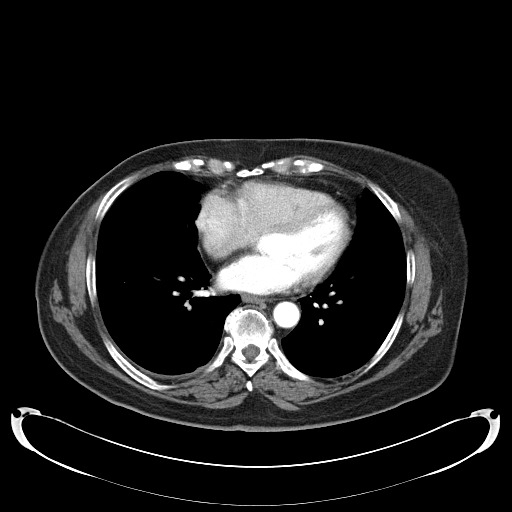
[im 90/128  bone]
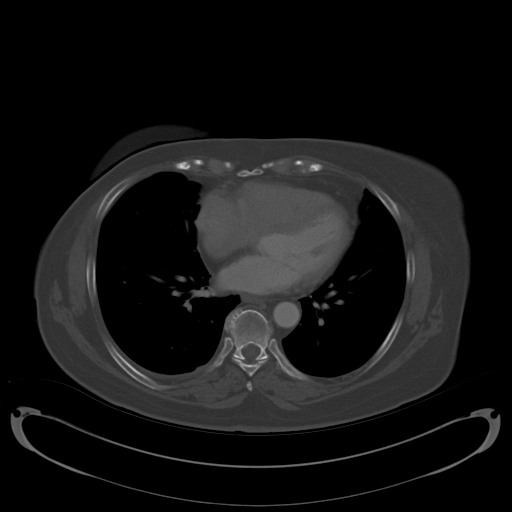
[im 98/128  soft-tissue]
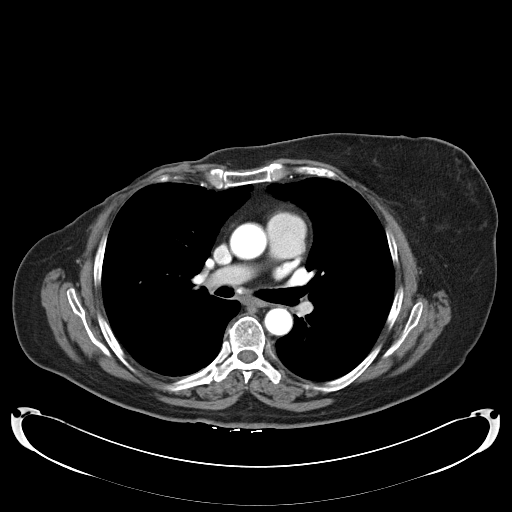
[im 113/128  soft-tissue]
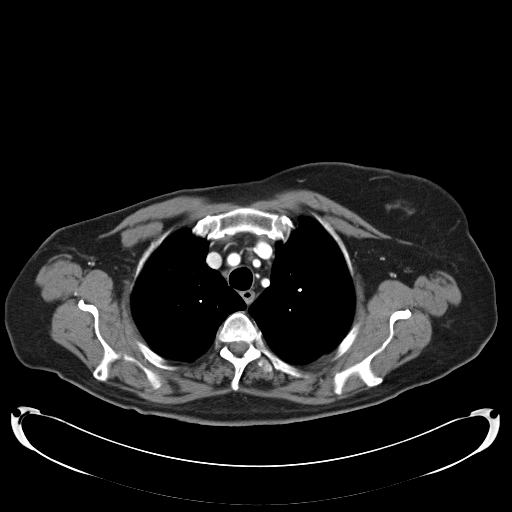
[im 120/128  soft-tissue]
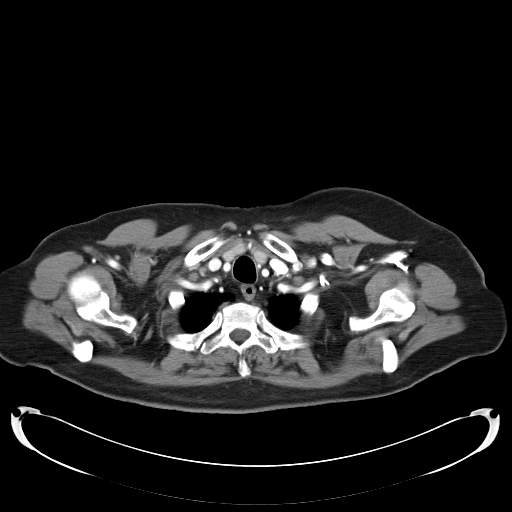

[Series 602: <mpr thick range> · coronal · 1.24mm/px · 3 of 88 slices shown]
[im 30/88  soft-tissue]
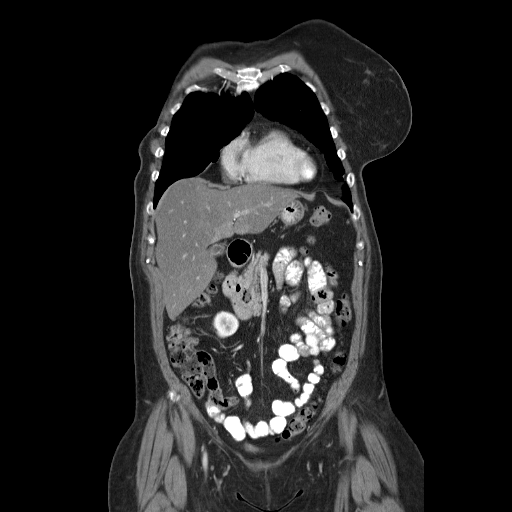
[im 39/88  soft-tissue]
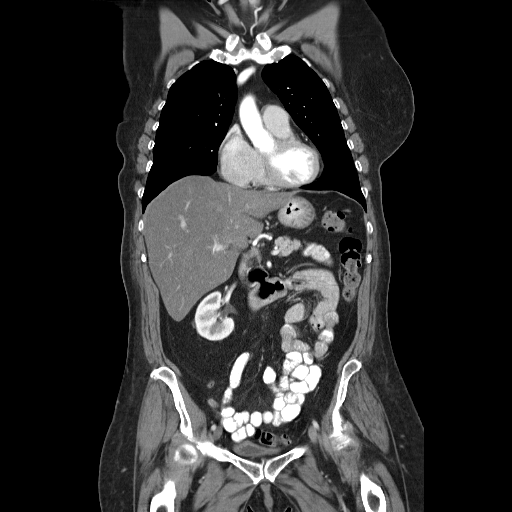
[im 49/88  soft-tissue]
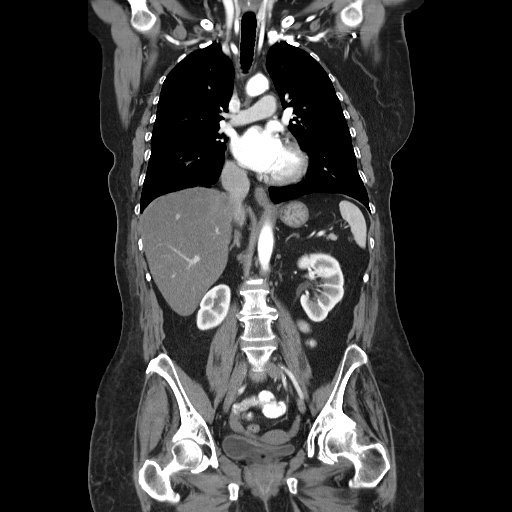

[15 of 46 positions shown; findings below may reference images not displayed]

FINDINGS: RECIST

Target Lesions:

1. Right lower lobe pulmonary nodule which measures 6 mm on image
43, unchanged from 6 mm on the prior.
2. Normal size subcarinal node at 6 mm on image 31, unchanged.
3. Normal size right hilar node which measures 8 mm on image 30 and
is unchanged.
Non-target Lesions:

None

CT CHEST FINDINGS

Mediastinum/Nodes: No supraclavicular adenopathy. right mastectomy
and axillary node dissection. No axillary adenopathy. Mild
cardiomegaly, without pericardial effusion. No central pulmonary
embolism, on this non-dedicated study. No mediastinal or hilar
adenopathy. No internal mammary adenopathy.

Lungs/Pleura: Probable secretions in the right-sided the trachea on
image 13 and more inferiorly within the dependent trachea.

Mild volume loss and probable scarring at the right lung base. 6 mm
right lower lobe nodule is unchanged on image 43, as above.

Trace right pleural fluid or thickening, similar.

Musculoskeletal: No acute osseous abnormality.

CT ABDOMEN PELVIS FINDINGS

Hepatobiliary: Moderate hepatic steatosis. Vague right liver lobe
subcapsular hyperenhancement image 58 is similar and likely related
to altered perfusion. Fat sparing is identified adjacent to the
gallbladder. Normal gallbladder, without biliary ductal dilatation.
There is a periampullary duodenal diverticulum.

Pancreas: Mild pancreatic atrophy.  No duct dilatation.

Spleen: Normal

Adrenals/Urinary Tract: Normal adrenal glands. Normal kidneys,
without hydronephrosis. Normal urinary bladder.

Stomach/Bowel: Normal stomach, without wall thickening. Scattered
colonic diverticula. High Normal terminal ileum and appendix. Normal
small bowel.

Vascular/Lymphatic: Aortic and branch vessel atherosclerosis. No
abdominopelvic adenopathy.

Reproductive: Normal uterus and adnexa.

Other: No evidence of omental or peritoneal disease. No abdominal
pelvic fluid.

Musculoskeletal: Advanced left hip osteoarthritis. Vague sclerosis
involving the left iliac wing is unchanged, including on image 96 of
series 2. There is also similar sclerosis involving the left side of
the sacrum. Degenerative disc disease involves L3-4 and L5-S1.
Similar medial eleventh left rib sclerosis which may be
posttraumatic.
IMPRESSION: CT CHEST IMPRESSION

1. Right mastectomy and axillary node dissection. No evidence of
metastatic disease or acute process.
2. Trace right pleural thickening or fluid, similar.

CT ABDOMEN AND PELVIS IMPRESSION

1. Similar osseous metastasis involving the left hemipelvis.
2. No extraosseous metastasis within the abdomen or pelvis.
3. Hepatic steatosis.

## 2016-03-22 ENCOUNTER — Ambulatory Visit
Admission: RE | Admit: 2016-03-22 | Discharge: 2016-03-22 | Disposition: A | Discharge status: Home / Self Care | Payer: Medicare Other | Source: Ambulatory Visit | Attending: Oncology | Admitting: Oncology

## 2016-03-22 DIAGNOSIS — Z1231 Encounter for screening mammogram for malignant neoplasm of breast: Secondary | ICD-10-CM

## 2016-04-25 ENCOUNTER — Encounter: Payer: Self-pay | Admitting: *Deleted

## 2016-04-25 NOTE — Progress Notes (Signed)
04/25/16 at 4:18pm - The research nurse noted 2 queries regarding the pt's reported "broken tooth" on 06/21/13.  The pt said that her dentist prescribed an antibiotic for 10 days for prophylaxis for infection prevention.  She said then her dentist removed the tooth on 06/29/13. The research nurse entered the broken tooth as the AE with start date of 06/21/13 and end date with tooth removal on 06/29/13.  The research nurse also added the tooth removal as a non-drug therapy for the AE. Brion Aliment RN, BSN, CCRP  Clinical Research Nurse 04/25/2016 4:22 PM

## 2016-05-09 DIAGNOSIS — Z96642 Presence of left artificial hip joint: Secondary | ICD-10-CM | POA: Diagnosis not present

## 2016-05-09 DIAGNOSIS — Z471 Aftercare following joint replacement surgery: Secondary | ICD-10-CM | POA: Diagnosis not present

## 2016-05-16 ENCOUNTER — Ambulatory Visit (HOSPITAL_BASED_OUTPATIENT_CLINIC_OR_DEPARTMENT_OTHER): Payer: Medicare Other | Admitting: Oncology

## 2016-05-16 ENCOUNTER — Encounter: Payer: Self-pay | Admitting: *Deleted

## 2016-05-16 ENCOUNTER — Other Ambulatory Visit (HOSPITAL_BASED_OUTPATIENT_CLINIC_OR_DEPARTMENT_OTHER): Payer: Medicare Other

## 2016-05-16 VITALS — BP 119/62 | HR 89 | Temp 97.7°F | Resp 18 | Ht 64.0 in | Wt 165.5 lb

## 2016-05-16 DIAGNOSIS — Z170 Estrogen receptor positive status [ER+]: Secondary | ICD-10-CM

## 2016-05-16 DIAGNOSIS — C773 Secondary and unspecified malignant neoplasm of axilla and upper limb lymph nodes: Secondary | ICD-10-CM

## 2016-05-16 DIAGNOSIS — C801 Malignant (primary) neoplasm, unspecified: Secondary | ICD-10-CM | POA: Diagnosis not present

## 2016-05-16 DIAGNOSIS — C50411 Malignant neoplasm of upper-outer quadrant of right female breast: Secondary | ICD-10-CM

## 2016-05-16 DIAGNOSIS — I313 Pericardial effusion (noninflammatory): Secondary | ICD-10-CM

## 2016-05-16 DIAGNOSIS — Z006 Encounter for examination for normal comparison and control in clinical research program: Secondary | ICD-10-CM | POA: Diagnosis not present

## 2016-05-16 DIAGNOSIS — C7800 Secondary malignant neoplasm of unspecified lung: Secondary | ICD-10-CM

## 2016-05-16 DIAGNOSIS — C50911 Malignant neoplasm of unspecified site of right female breast: Secondary | ICD-10-CM

## 2016-05-16 DIAGNOSIS — I3131 Malignant pericardial effusion in diseases classified elsewhere: Secondary | ICD-10-CM

## 2016-05-16 DIAGNOSIS — I318 Other specified diseases of pericardium: Secondary | ICD-10-CM

## 2016-05-16 DIAGNOSIS — Z79811 Long term (current) use of aromatase inhibitors: Secondary | ICD-10-CM

## 2016-05-16 LAB — COMPREHENSIVE METABOLIC PANEL
ALT: 34 U/L (ref 0–55)
ANION GAP: 12 meq/L — AB (ref 3–11)
AST: 54 U/L — ABNORMAL HIGH (ref 5–34)
Albumin: 3.2 g/dL — ABNORMAL LOW (ref 3.5–5.0)
Alkaline Phosphatase: 223 U/L — ABNORMAL HIGH (ref 40–150)
BUN: 17.8 mg/dL (ref 7.0–26.0)
CALCIUM: 10 mg/dL (ref 8.4–10.4)
CHLORIDE: 109 meq/L (ref 98–109)
CO2: 20 mEq/L — ABNORMAL LOW (ref 22–29)
Creatinine: 1 mg/dL (ref 0.6–1.1)
EGFR: 62 mL/min/{1.73_m2} — ABNORMAL LOW (ref >90)
Glucose: 164 mg/dl — ABNORMAL HIGH (ref 70–140)
POTASSIUM: 3.6 meq/L (ref 3.5–5.1)
Sodium: 141 mEq/L (ref 136–145)
Total Bilirubin: 0.26 mg/dL (ref 0.20–1.20)
Total Protein: 8.1 g/dL (ref 6.4–8.3)

## 2016-05-16 LAB — CBC WITH DIFFERENTIAL/PLATELET
BASO%: 1 % (ref 0.0–2.0)
BASOS ABS: 0.1 10*3/uL (ref 0.0–0.1)
EOS%: 2.1 % (ref 0.0–7.0)
Eosinophils Absolute: 0.2 10*3/uL (ref 0.0–0.5)
HEMATOCRIT: 42.8 % (ref 34.8–46.6)
HGB: 14.2 g/dL (ref 11.6–15.9)
LYMPH#: 0.9 10*3/uL (ref 0.9–3.3)
LYMPH%: 12.7 % — AB (ref 14.0–49.7)
MCH: 26.1 pg (ref 25.1–34.0)
MCHC: 33.2 g/dL (ref 31.5–36.0)
MCV: 78.7 fL — ABNORMAL LOW (ref 79.5–101.0)
MONO#: 0.7 10*3/uL (ref 0.1–0.9)
MONO%: 9.4 % (ref 0.0–14.0)
NEUT#: 5.5 10*3/uL (ref 1.5–6.5)
NEUT%: 74.8 % (ref 38.4–76.8)
Platelets: 314 10*3/uL (ref 145–400)
RBC: 5.44 10*6/uL (ref 3.70–5.45)
RDW: 13.8 % (ref 11.2–14.5)
WBC: 7.4 10*3/uL (ref 3.9–10.3)

## 2016-05-16 LAB — FERRITIN: FERRITIN: 123 ng/mL (ref 9–269)

## 2016-05-16 MED ORDER — INV-EVEROLIMUS (RAD0001) 5MG TABLET NOVARTIS CRAD001Y24135: 2.0000 | ORAL_TABLET | Freq: Every day | ORAL | 0 refills | Status: DC

## 2016-05-16 NOTE — Progress Notes (Signed)
05/16/16 at 11:18am -[employer]/Bolero 4 extension visit #3.  The pt was into the cancer center this morning for her visit #3 in the extension phase of the trial.  The pt reports that she is feeling great and denies any new complaints.  She reports the following AE's as ongoing:  hot flashes, memory impairment, fatigue, dry skin, and back pain.  She said that she no longer has any nail issues.  She said that in mid September she felt "run down" for 3 weeks.  She said that she starting drinking Ensure, and she began to feel normal.  Dr. Jana Hakim said that it might have been a "virus" or maybe "some type of deficiency".  He did not feel that this 3 week episode was related to her study treatment (everolimus and letrozole).  The pt has been on the study treatment for over 4 years now.   She is still enjoying playing golf on a regular basis.  She recently traveled to [place] to visit with her family.  She was seen and examined by Dr. Jana Hakim.  He reviewed her labs.  He stated that in his opinion she is still receiving benefit from her study treatment.  He stated that he does not want to make any changes to her current treatment plan.  The pt returned her 6 kits of study drug, everolimus, to the study nurse today. The pt only returned 2 unopened pills of everolimus.  The pt should have taken 168 pills but she returned 2 pills.  The pt said that she missed taking her everolimus on 03/18/16 (day of the eclipse).  The pt said that she was so busy on that day taking pictures of the eclipse that she forgot.  The pt is 98.8% compliant with her study drug.  The pt was dispensed 6 kits of study drug today to last her for 12 weeks.  The pt's dispensed study drug has a lot number that is set to expire in January 2018 (08/28/16).  The pt's last dose of this study drug will be on 08/07/16 prior to the expiration date.  The pt confirmed that she is taking her letrozole 2.5 mg daily with her everolimus.  The pt is aware of her new  appointment on 08/08/16.  Dr. Jana Hakim stated that he will re-scan her with a CT/chest the end of December 2017.  The pt was in agreement with this plan.  The pt's ECOG =0.  The pt is able to do all of her normal activities.   The pt's concomitant medications were reviewed with her, and she denies any new medications.  Dr. Jana Hakim agreed to check a lipid panel in January 2018.  The pt's mammogram in August did not reveal any evidence of malignancy.  The pt was thanked for her continued support and compliance on the study.   Brion Aliment RN, BSN, CCRP Clinical Research Nurse 05/16/2016 11:25 AM

## 2016-05-16 NOTE — Progress Notes (Signed)
[facility]  [phone] [phone]     ID: Barbara Parks DOB: 10/18/1950  MR#: [id number]  CSN#:[id number]  Patient Care Team: Donna Gates, MD as PCP - General (Family Medicine) Mark C Skains, MD (Cardiology) Bryan K Bartle, MD (Cardiothoracic Surgery) Gustav C Magrinat, MD as Consulting Physician (Oncology) OTHER MD: Dr Sandra Barlow- [place], DDS; Frank Aluisio MD  CHIEF COMPLAINT: metastatic breast cancer, on BOLERO study  CURRENT TREATMENT: letrozole + everolimus  BREAST CANCER HISTORY: From Dr. Peter Rubin's original intake note 04/13/2004:  "This woman has been in good health all of her life. She recently moved from [place] to work here.  She palpated a mass at about the 12 o'clock position in mid-July. She has not noticed any nipple retraction or skin changes.  She was seen by her primary care doctor who subsequently referred her for a mammogram.  Mammogram was performed on 03/01/04. This demonstrated a spiculated 2 cm mass at the 12 o'clock position in the right breast.  Upper outer quadrant of the left breast shows some distortion.   Physical exam at that time showed a firm, nontender nodule at the 12 o'clock position in the right breast, 5 cm from the nipple.  Ultrasound of this area showed a hypoechoic ill-defined mass, measuring 2.2 x 1.3 x 1.4 cm.  Physical exam of the left breast showed general vague thickening, upper outer quadrant of the left breast with a discrete palpable mass. The ultrasound performed showed a single hypoechoic ill-defined nodule at the 1 o'clock position, measuring 7 x 5 x 6 mm.  She had biopsies of both lesions on 03/02/03.  Needle core biopsy of the lesion on the right breast revealed invasive mammary carcinoma.  Needle core biopsy of the left breast showed a complex fibroadenoma.  Prognostic panel of the lesion on the right breast showed it to be ER positive at 73%, PR positive at 90% and proliferative index 9%, HER-2  was 1+.  Patient was referred to Dr. Young, who performed a simple mastectomy with sentinel lymph node evaluation on 03/22/04.  Final pathology showed this to be an invasive ductal carcinoma  with lobular features, measuring 2.2 cm, grade 2 of 3.  Margins negative for carcinoma.  Invasive ductal carcinoma was extended to involve deep dermis of the nipple.  Lymphovascular invasion was identified.  Total of 4 sentinel lymph nodes were evaluated.  Touch imprints at the time of the OR was felt to be negative.  Subsequent evaluation showed a 5 mm focus of metastatic carcinoma in one of the four lymph nodes on microscopic after sectioning.  There was extracapsular extension  of one lymph node as well. The remaining three lymph nodes were all negative."  The patient's subsequent history is detailed below.  INTERVAL HISTORY: Barbara Parks returns today for follow up of her estrogen receptor positive breast cancer, her research nurse was also present during today's visit. Barbara Parks continues on letrozole, with good tolerance.  Hot flashes and vaginal dryness are not a major issue. She never developed the arthralgias or myalgias that many patients can experience on this medication. She obtains it at a good price. She is also on exemestane. She has no side effects related to it that she is aware of.   REVIEW OF SYSTEMS: Barbara Parks is doing much better now that she is a year out from her hip surgery. She just when golfing in [place] and even though he was very cold she greatly enjoyed she had an episode of   2 or 3 weeks which she was feeling a little bit puny. She took some Ensure and it took care of the problem aside from that a detailed review of systems today was stable, with no symptoms related to either her disease or its treatment at this point.   PAST MEDICAL HISTORY: Past Medical History:  Diagnosis Date  . Arthritis    left hip  . Asthma   . breast ca 2005   breast/chemo R mastectomy  . GERD (gastroesophageal  reflux disease)   . Hypercholesteremia   . Metastasis to lung ([place]) dx'd 08/2011  . Osteopenia due to cancer therapy 09/09/2013  . Palpitations   . Shortness of breath     PAST SURGICAL HISTORY: Past Surgical History:  Procedure Laterality Date  . BREAST SURGERY  2005   right  . CATARACT EXTRACTION W/PHACO Left 01/18/2014   Procedure: CATARACT EXTRACTION PHACO AND INTRAOCULAR LENS PLACEMENT (IOC);  Surgeon: Elta Guadeloupe T. Gershon Crane, MD;  Location: [facility];  Service: Ophthalmology;  Laterality: Left;  CDE 15.79  . CATARACT EXTRACTION W/PHACO Right 02/08/2014   Procedure: CATARACT EXTRACTION PHACO AND INTRAOCULAR LENS PLACEMENT (IOC);  Surgeon: Elta Guadeloupe T. Gershon Crane, MD;  Location: [facility];  Service: Ophthalmology;  Laterality: Right;  CDE 4.41  . CHEST TUBE INSERTION  09/11/2011   Procedure: INSERTION PLEURAL DRAINAGE CATHETER;  Surgeon: Gaye Pollack, MD;  Location: [place];  Service: Thoracic;  Laterality: Right;  . PERICARDIAL WINDOW  09/11/2011   Procedure: PERICARDIAL WINDOW;  Surgeon: Gaye Pollack, MD;  Location: [facility] OR;  Service: Thoracic;  Laterality: N/A;  . REMOVAL OF PLEURAL DRAINAGE CATHETER  12/19/2011   Procedure: REMOVAL OF PLEURAL DRAINAGE CATHETER;  Surgeon: Gaye Pollack, MD;  Location: [place];  Service: Thoracic;  Laterality: Right;  TO BE DONE IN MINOR ROOM, SHORT STAY  . [place]  . TOTAL HIP ARTHROPLASTY Left 06/07/2015   Procedure: LEFT TOTAL HIP ARTHROPLASTY ANTERIOR APPROACH;  Surgeon: Gaynelle Arabian, MD;  Location: [facility];  Service: Orthopedics;  Laterality: Left;  Marland Kitchen VIDEO BRONCHOSCOPY  09/11/2011   Procedure: VIDEO BRONCHOSCOPY;  Surgeon: Gaye Pollack, MD;  Location: [facility] OR;  Service: Thoracic;  Laterality: N/A;    FAMILY HISTORY Family History  Problem Relation Age of Onset  . Cancer Mother     breast  . Heart disease Father   . Anesthesia problems Neg Hx    the patient's mother was diagnosed with breast cancer at age 40, she died age 34 from congestive heart  failure..The patient's father died from heart disease at age 59.  She has one sister alive & well.  Two brothers alive & well, one with diabetes. The patient's sister was also diagnosed with breast cancer, and was tested for the BRCA gene, and was negative. The patient herself has not been tested. There is no history of ovarian cancer in the family  GYNECOLOGIC HISTORY:  No LMP recorded. Patient is postmenopausal. Menarche age 60, the patient is GX P0. She stopped having periods with chemotherapy in 2005. 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 She was a Archivist. She is single, lives alone with her Shitzu-poodle Sammie.. Family is all in the [place] area.     ADVANCED DIRECTIVES: Not in place. At the 02/23/2014 visit the patient was given the appropriate documents to complete and notarize at her discretion. She  tells me she is planning to name her sister, Gloria Bowzer, as healthcare power of attorney. Gloria can be reached at [phone]   HEALTH MAINTENANCE: Social History  Substance Use Topics  . Smoking status: Former Smoker    Packs/day: 1.50    Years: 30.00    Types: Cigarettes    Quit date: 09/10/2007  . Smokeless tobacco: Not on file  . Alcohol use No     Colonoscopy:  PAP:  Bone density: 08/14/2015, T score -1.6  Lipid panel:  Allergies  Allergen Reactions  . Aspirin     REACTION: upset stomach  . Latex Other (See Comments)    Blistering and skin peels off  . Nsaids Nausea And Vomiting    Extreme nausea and vomiting    Current Outpatient Prescriptions  Medication Sig Dispense Refill  . cholecalciferol (VITAMIN D) 1000 units tablet Take 1,000 Units by mouth 2 (two) times daily.    . gemfibrozil (LOPID) 600 MG tablet Take 1 tablet (600 mg total) by mouth 2 (two) times daily before a meal. 180 tablet 12  . letrozole (FEMARA) 2.5 MG tablet Take 1  tablet (2.5 mg total) by mouth daily. 90 tablet 3  . loperamide (IMODIUM) 2 MG capsule Take 2 mg by mouth 4 (four) times daily as needed for diarrhea or loose stools. Reported on 11/30/2015    . metoprolol tartrate (LOPRESSOR) 25 MG tablet Take 0.5 tablets (12.5 mg total) by mouth daily. 30 tablet 4  . omeprazole (PRILOSEC) 40 MG capsule Take 1 capsule (40 mg total) by mouth daily as needed. 90 capsule 3  . ondansetron (ZOFRAN) 8 MG tablet Take 1 tablet (8 mg total) by mouth 2 (two) times daily as needed (Nausea or vomiting). (Patient not taking: Reported on 09/20/2015) 30 tablet 1  . prochlorperazine (COMPAZINE) 25 MG suppository Place 1 suppository (25 mg total) rectally every 12 (twelve) hours as needed for nausea. (Patient not taking: Reported on 09/20/2015) 12 suppository 3   No current facility-administered medications for this visit.     OBJECTIVE: Middle-aged white woman Who appears well Vitals:   05/16/16 1016 05/16/16 1017  BP: (!) 114/59 119/62  Pulse:    Resp:    Temp:       Body mass index is 28.41 kg/m.    ECOG FS:0 - Asymptomatic  Sclerae unicteric, pupils round and equal Oropharynx clear and moist-- no thrush or other lesions No cervical or supraclavicular adenopathy Lungs no rales or rhonchi Heart regular rate and rhythm Abd soft, nontender, positive bowel sounds MSK no focal spinal tenderness, no upper extremity lymphedema Neuro: nonfocal, well oriented, appropriate affect Breasts: The right breast is status post mastectomy. There is no evidence of local recurrence. The right axilla is benign. The left breast is unremarkable.    LAB RESULTS:  CMP     Component Value Date/Time   NA 139 02/20/2016 1057   K 4.0 02/20/2016 1057   CL 106 09/20/2015 1257   CL 109 (H) 12/31/2012 0940   CO2 22 02/20/2016 1057   GLUCOSE 139 02/20/2016 1057   GLUCOSE 127 (H) 12/31/2012 0940   BUN 22.2 02/20/2016 1057   CREATININE 1.0 02/20/2016 1057   CALCIUM 10.1 02/20/2016 1057    PROT 8.2 02/20/2016 1057   ALBUMIN 3.5 02/20/2016 1057   AST 47 (H) 02/20/2016 1057   ALT 37 02/20/2016 1057   ALKPHOS 261 (H) 02/20/2016 1057   BILITOT 0.34 02/20/2016 1057   GFRNONAA >60 09/20/2015 1257   GFRAA >  60 09/20/2015 1257    I No results found for: SPEP  Lab Results  Component Value Date   WBC 7.4 05/16/2016   NEUTROABS 5.5 05/16/2016   HGB 14.2 05/16/2016   HCT 42.8 05/16/2016   MCV 78.7 (L) 05/16/2016   PLT 314 05/16/2016      Chemistry      Component Value Date/Time   NA 139 02/20/2016 1057   K 4.0 02/20/2016 1057   CL 106 09/20/2015 1257   CL 109 (H) 12/31/2012 0940   CO2 22 02/20/2016 1057   BUN 22.2 02/20/2016 1057   CREATININE 1.0 02/20/2016 1057      Component Value Date/Time   CALCIUM 10.1 02/20/2016 1057   ALKPHOS 261 (H) 02/20/2016 1057   AST 47 (H) 02/20/2016 1057   ALT 37 02/20/2016 1057   BILITOT 0.34 02/20/2016 1057       Lab Results  Component Value Date   LABCA2 58 (H) 09/14/2012    No components found for: LABCA125  No results for input(s): INR in the last 168 hours.  Urinalysis    Component Value Date/Time   COLORURINE YELLOW 09/20/2015 1432   APPEARANCEUR HAZY (A) 09/20/2015 1432   LABSPEC >1.030 (H) 09/20/2015 1432   LABSPEC 1.030 07/13/2015 0841   PHURINE 5.5 09/20/2015 1432   GLUCOSEU 500 (A) 09/20/2015 1432   GLUCOSEU Negative 07/13/2015 0841   HGBUR MODERATE (A) 09/20/2015 1432   BILIRUBINUR NEGATIVE 09/20/2015 1432   BILIRUBINUR Negative 07/13/2015 0841   KETONESUR NEGATIVE 09/20/2015 1432   PROTEINUR 30 (A) 09/20/2015 1432   UROBILINOGEN 0.2 07/13/2015 0841   NITRITE NEGATIVE 09/20/2015 1432   LEUKOCYTESUR NEGATIVE 09/20/2015 1432   LEUKOCYTESUR Negative 07/13/2015 0841    STUDIES:  Left mammography 03/25/2016 was negative  ASSESSMENT: 65 y.o. [place] woman with stage IV breast cancer, on BOLERO-4 trial  (1) status post right mastectomy and sentinel lymph node sampling 03/22/2004 for a right  upper-outer quadrant pT2 pN1, stage IIB invasive ductal carcinoma with lobular features, grade 2, estrogen receptor and progesterone receptor positive, HER-2 negative, with an MIB-1 of 9% ([id number] and [id number])  (2) addition all right axillary lymph node sampling 05/07/2004 showed 2 benign lymph nodes (4 lymph nodes previously removed, so total was one positive lymph node out of 6; [id number])  (3) the patient was evaluated by radiation oncology; no postmastectomy radiation recommended  (4) adjuvant chemotherapy with dose dense doxorubicin and cyclophosphamide x4 cycles (first cycle delayed one week) followed by dose dense paclitaxel x4 was completed 09/18/2004  (5) tamoxifen started March 2006, discontinued 2009  METASTATIC DISEASE: (6) presenting with a large pericardial effusion, large right pleural effusion and possible right middle lobe bronchial obstruction February 2013, status post pericardial window placement, fiberoptic bronchoscopy and right Pleurx placement 09/11/2011, with biopsy of the bronchus intermedius and pericardium positive for metastatic breast cancer, estrogen receptor 91% positive with moderate staining intensity, progesterone receptor 100% positive with strong staining intensity, with an MIB-1 of 35%, and no HER-2 amplification, the signals ratio being 1.37 (SZA [id number])  (7) enrolled in BOLERO-4 trial 10/10/2011, receiving letrozole and everolimus  (a) two small areas of enhancement in the cerebellum noted by brain MRI 10/03/2011 were no longer apparent on repeat MRI 08/21/2012-- most recent brain MRI 04/01/2014 showed no evidence of intracranial metastatic disease  (b) sclerotic lesions in left iliac bone and sacrum have not been biopsied; stable; plan is to start zolendronate after patient updates her dental care (extraction planned)  (c) RLL lung   nodule, stable (rescanned 07/12/2014 and 08/11/2014) R hilar and subcarinal lymph nodes: stable  (9) additional problems:  (a)  hepatic steatosis  (b) COPD/ emphysema/ asthma  (c) advanced L hip osteoarthritis, status post left total hip replacement 06/07/2015  (d) aortoiliac atherosclerosis  (e) dental evaluation pending w possible dental extractions  (f) possible thalassemia trait  (10) Bone density concerns: DEXA scan 08/11/2013 was normal  (11) likely thalassemia trait  PLAN: Wajiha is now nearly 6 years out from definitive diagnosis of metastatic disease with no clinical evidence of active cancer. This is very favorable.  She is tolerating the letrozole and everolimus quite well and the plan will be to continue that indefinitely, until we see evidence of disease progression.  Her research nurse suggested we obtain a lipid panel since everolimus can affect the dose parameters and I have added that for her next set of labs.  She will have a restaging CT scan in December. She will see me again in January. She knows to call for any problems that may develop before that visit.  MAGRINAT,GUSTAV C, MD   05/16/2016 10:36 AM 

## 2016-05-17 LAB — CANCER ANTIGEN 27.29: CAN 27.29: 54.4 U/mL — AB (ref 0.0–38.6)

## 2016-05-20 ENCOUNTER — Other Ambulatory Visit: Payer: Self-pay | Admitting: Oncology

## 2016-06-04 IMAGING — CR DG THORACIC SPINE 3V
3 series · 3 of 3 positions shown · non-contrast
Comparison: Coronal and sagittal reconstructed images from a chest
CT scan dated November 02, 2014

CLINICAL DATA: Right-sided low back pain and right rib pain for the
past week without history of trauma, history of breast malignancy
status post mastectomy.

EXAM:
THORACIC SPINE - 2 VIEW + SWIMMERS

[w thoracic spine ap]
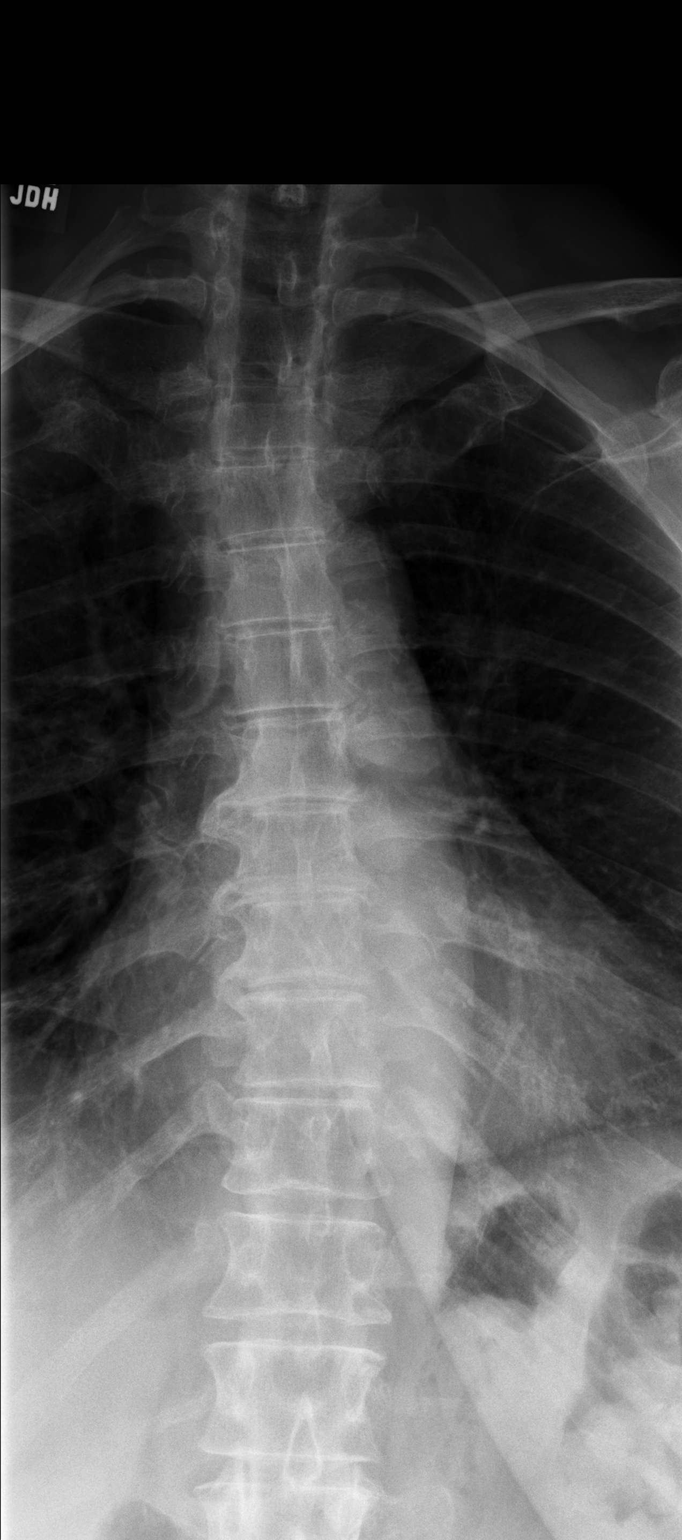

[w thoracic spine lat]
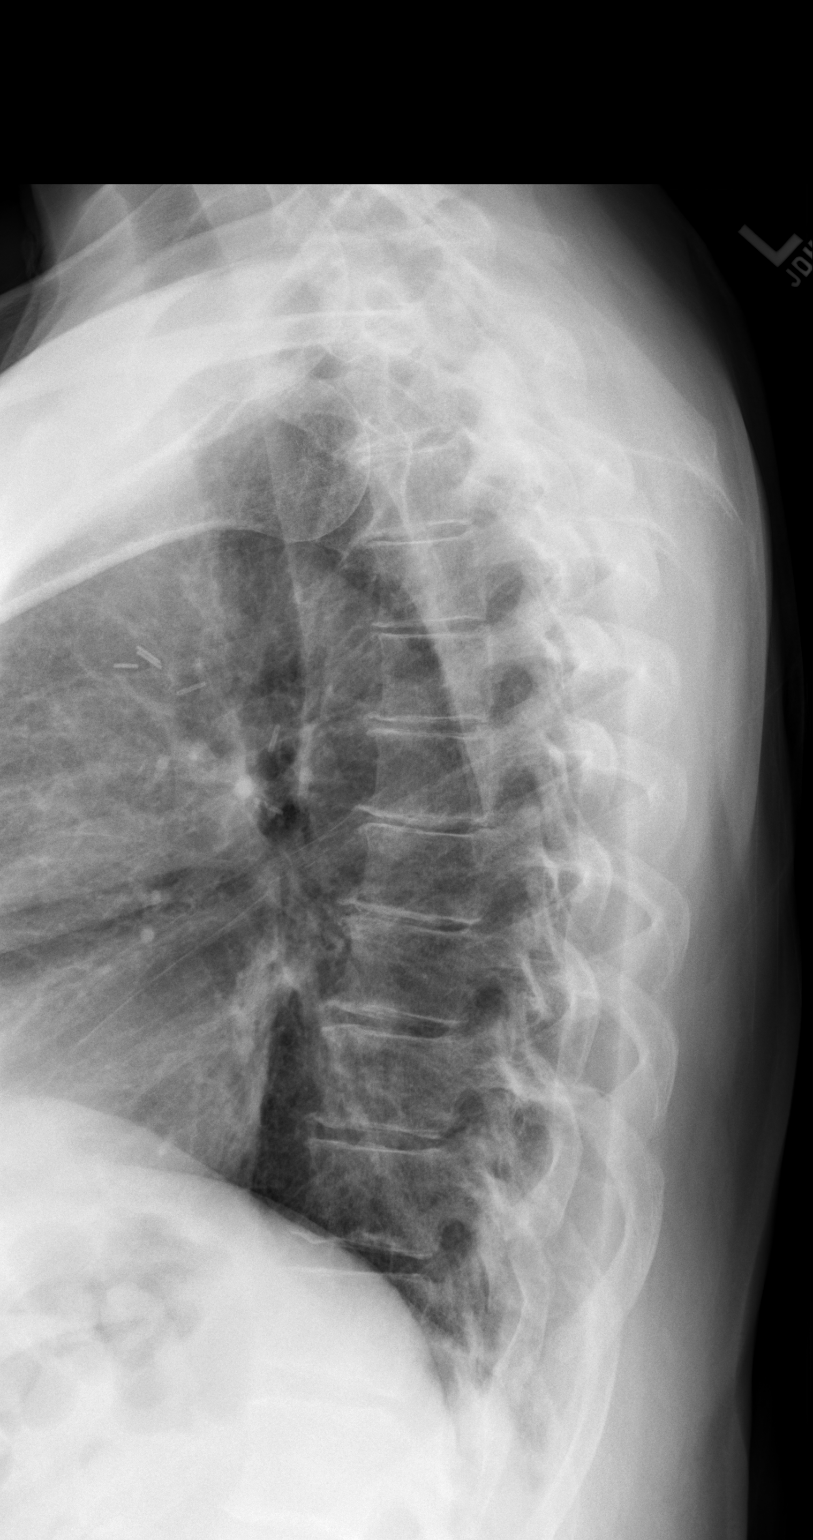

[w thoracic swimmers]
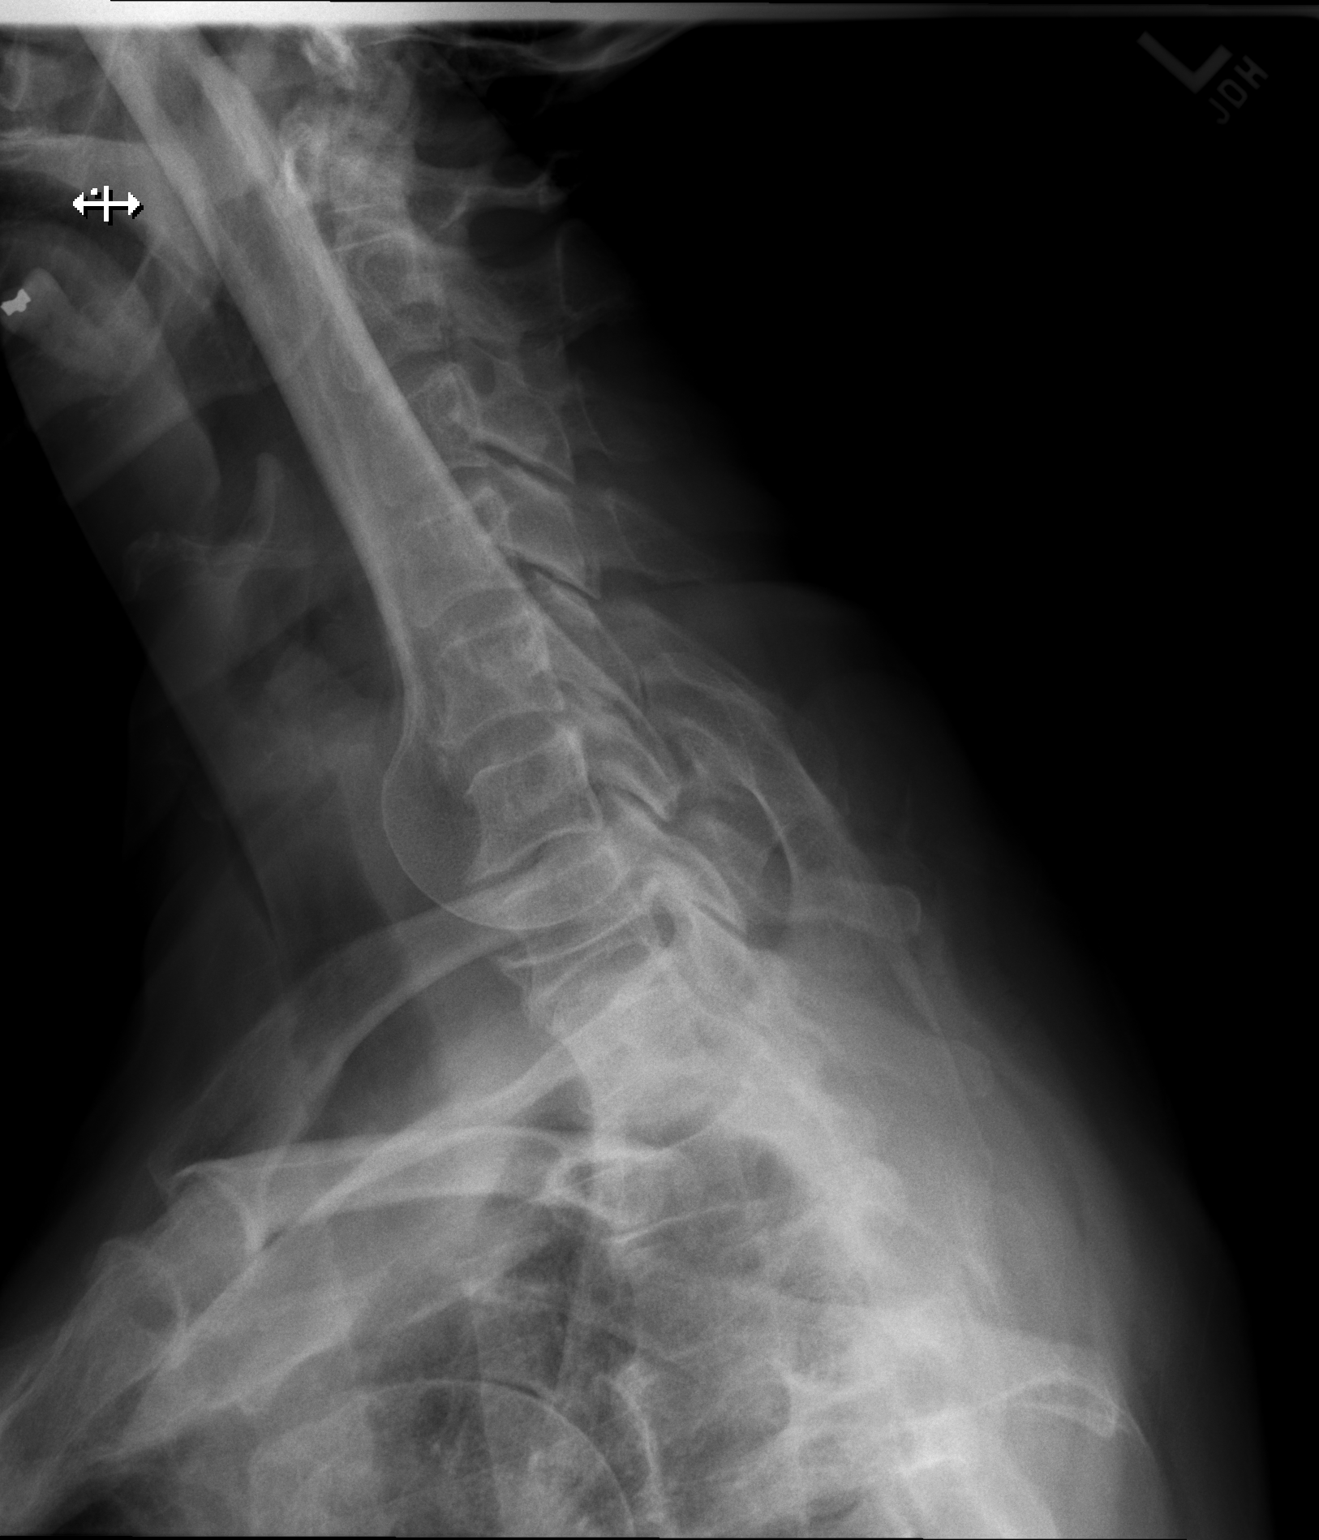

[3 of 3 positions shown; findings below may reference images not displayed]

FINDINGS: The thoracic vertebral bodies are preserved in height. There is very
gentle S-shaped cervico thoracic scoliosis. There is mild
degenerative disc space narrowing in the mid and lower thoracic
spine. There are large right lateral endplate osteophytes from T7
through T10. The pedicles are intact. There are no abnormal
paravertebral soft tissue densities.
IMPRESSION: There are mild degenerative changes of the thoracic spine. There is
no acute bony abnormality.

## 2016-06-04 IMAGING — CR DG LUMBAR SPINE COMPLETE 4+V
5 series · 5 of 5 positions shown · non-contrast
Comparison: CT 11/02/2014 and MRI 04/01/2014

CLINICAL DATA: Right-sided lumbar pain 1 week.  No trauma.

EXAM:
LUMBAR SPINE - COMPLETE 4+ VIEW

[w lumbar spine ap]
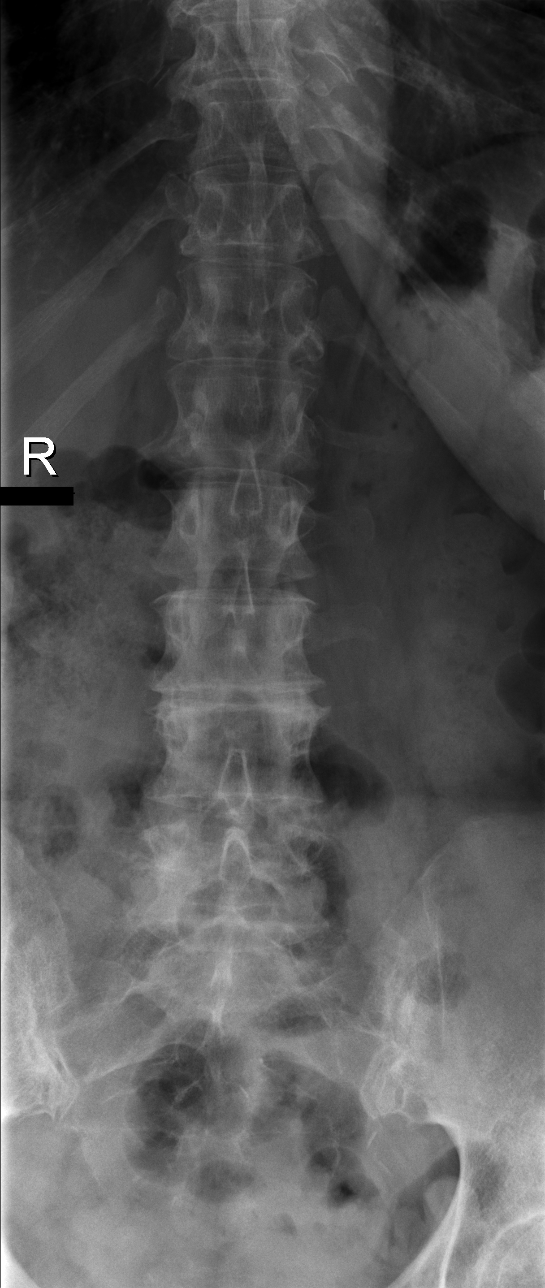

[w lumbar spine obl (1 of 2)]
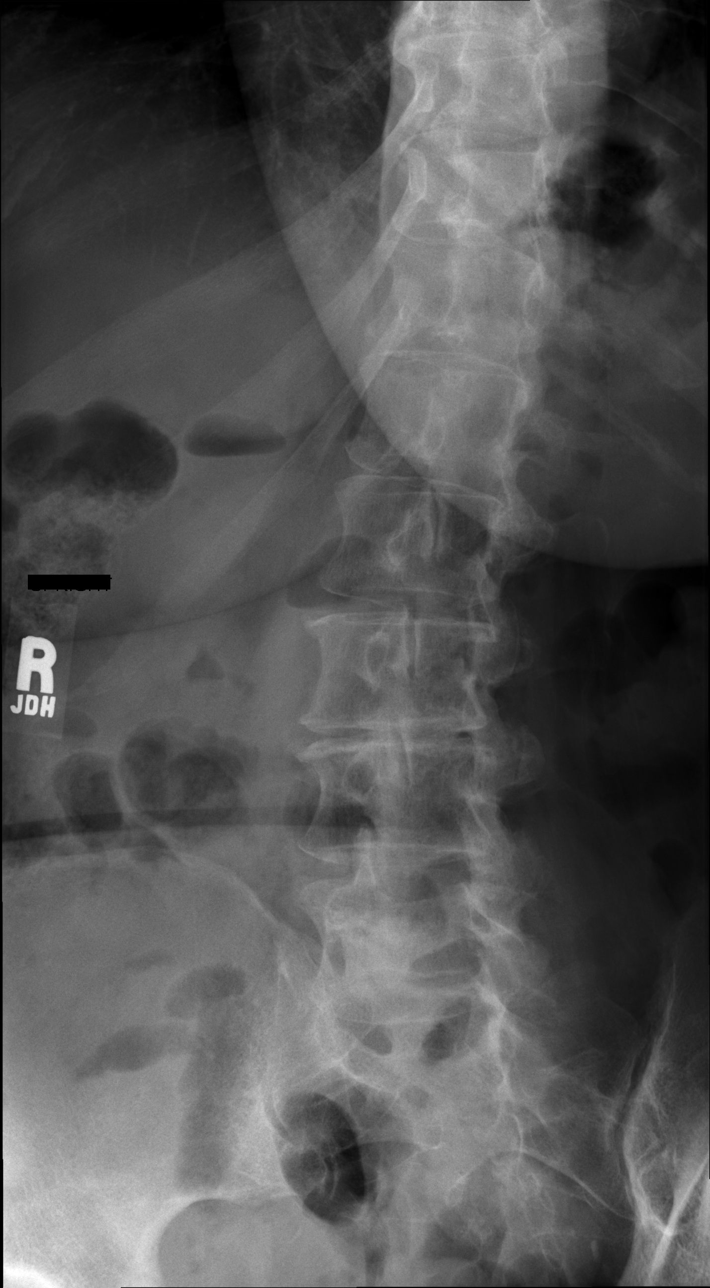

[w lumbar spine obl (2 of 2)]
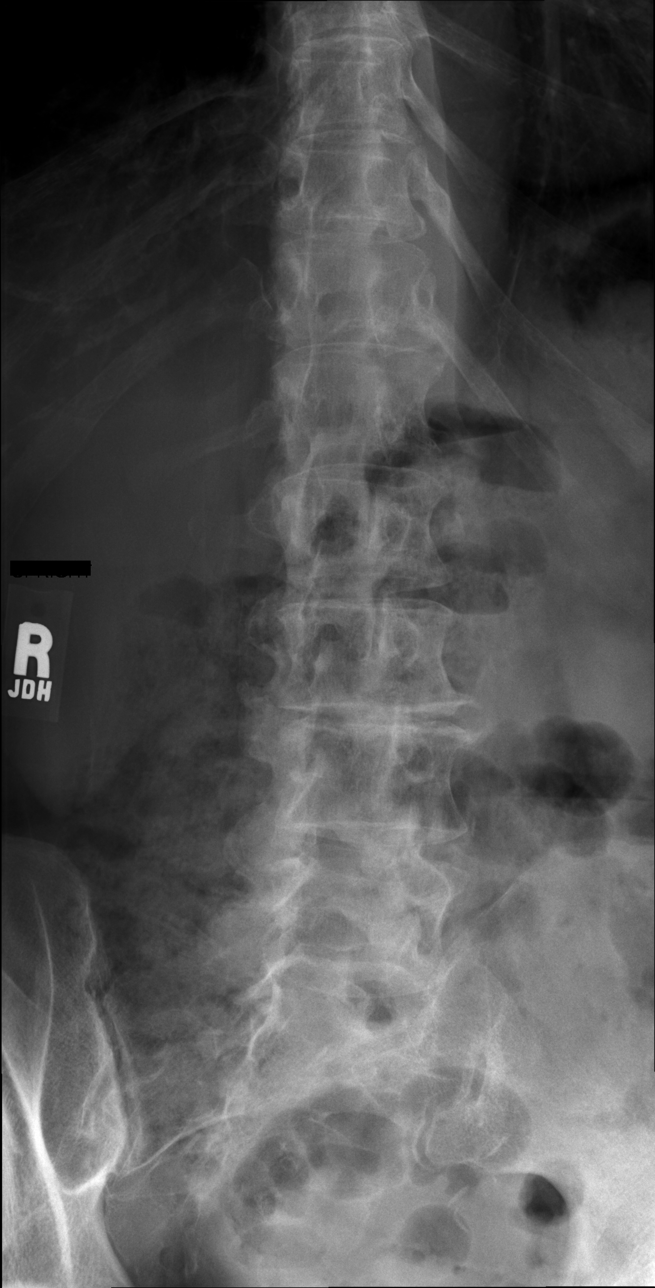

[w lumbar spine lat]
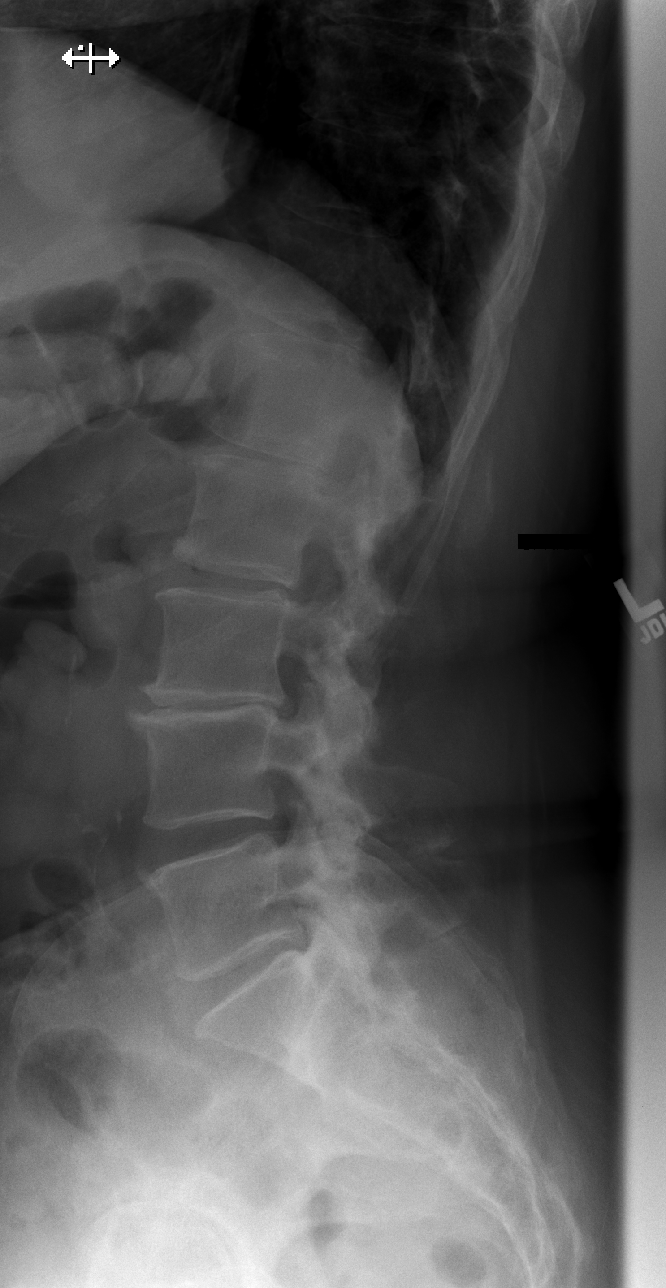

[w lumbar l-5 s-1 spot]
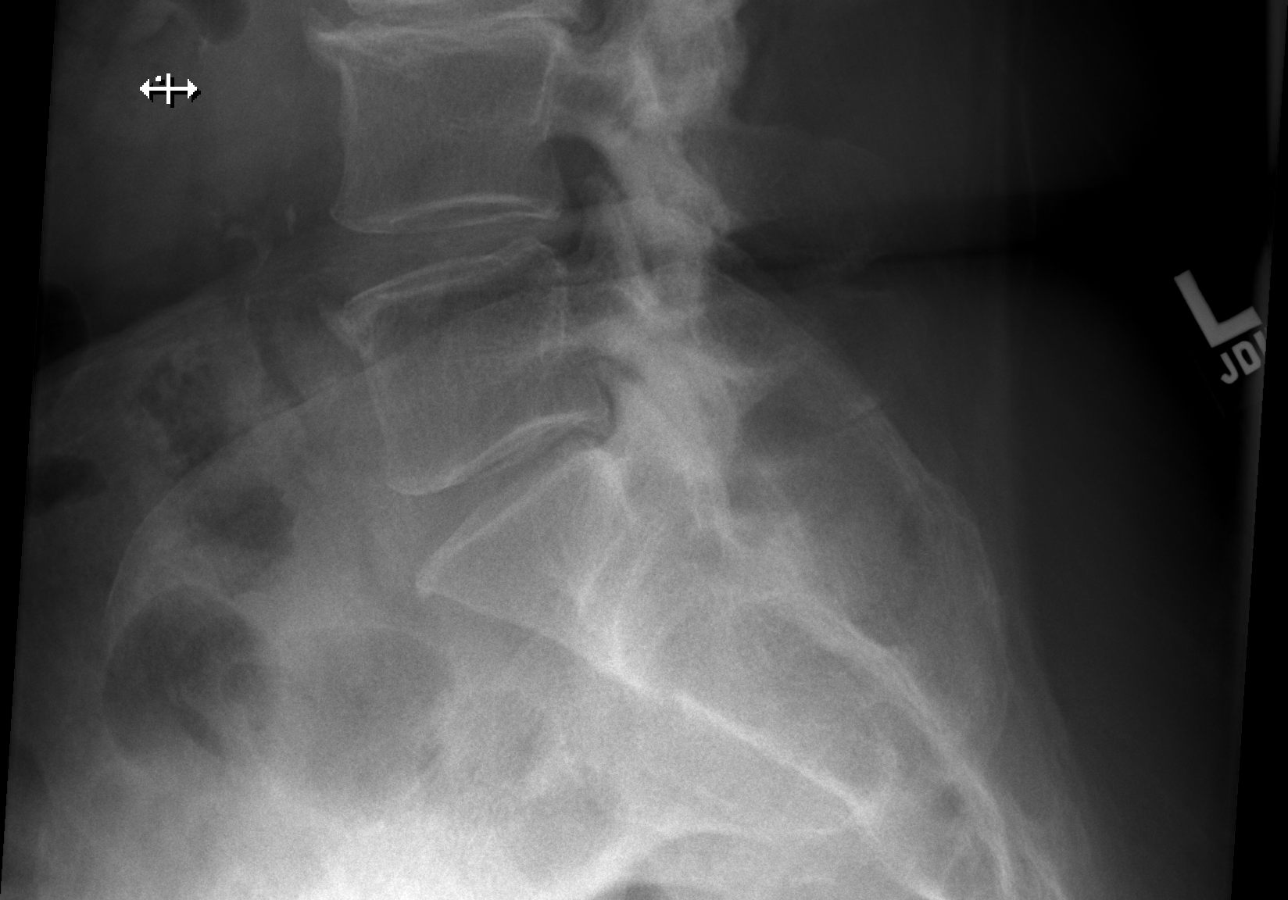

[5 of 5 positions shown; findings below may reference images not displayed]

FINDINGS: Vertebral body alignment and heights are within normal. There is
mild spondylosis throughout the lumbar spine to include moderate
facet arthropathy throughout the lumbar spine. There is significant
disc space narrowing at the L3-4 level with minimal disc space
narrowing at the L1-2, L2-3 and L5-S1 levels. No compression
fracture or subluxation.
IMPRESSION: Mild spondylosis of the lumbar spine to include moderate facet
arthropathy. Multilevel disc disease worse at the L3-4 level.

## 2016-06-04 IMAGING — CR DG RIBS W/ CHEST 3+V*R*
3 series · 3 of 3 positions shown · non-contrast
Comparison: CT chest 11/01/2004

CLINICAL DATA: Low back and right lower rib pain for 1 week. No
known injury. Initial encounter.

EXAM:
RIGHT RIBS AND CHEST - 3+ VIEW

[w chest pa]
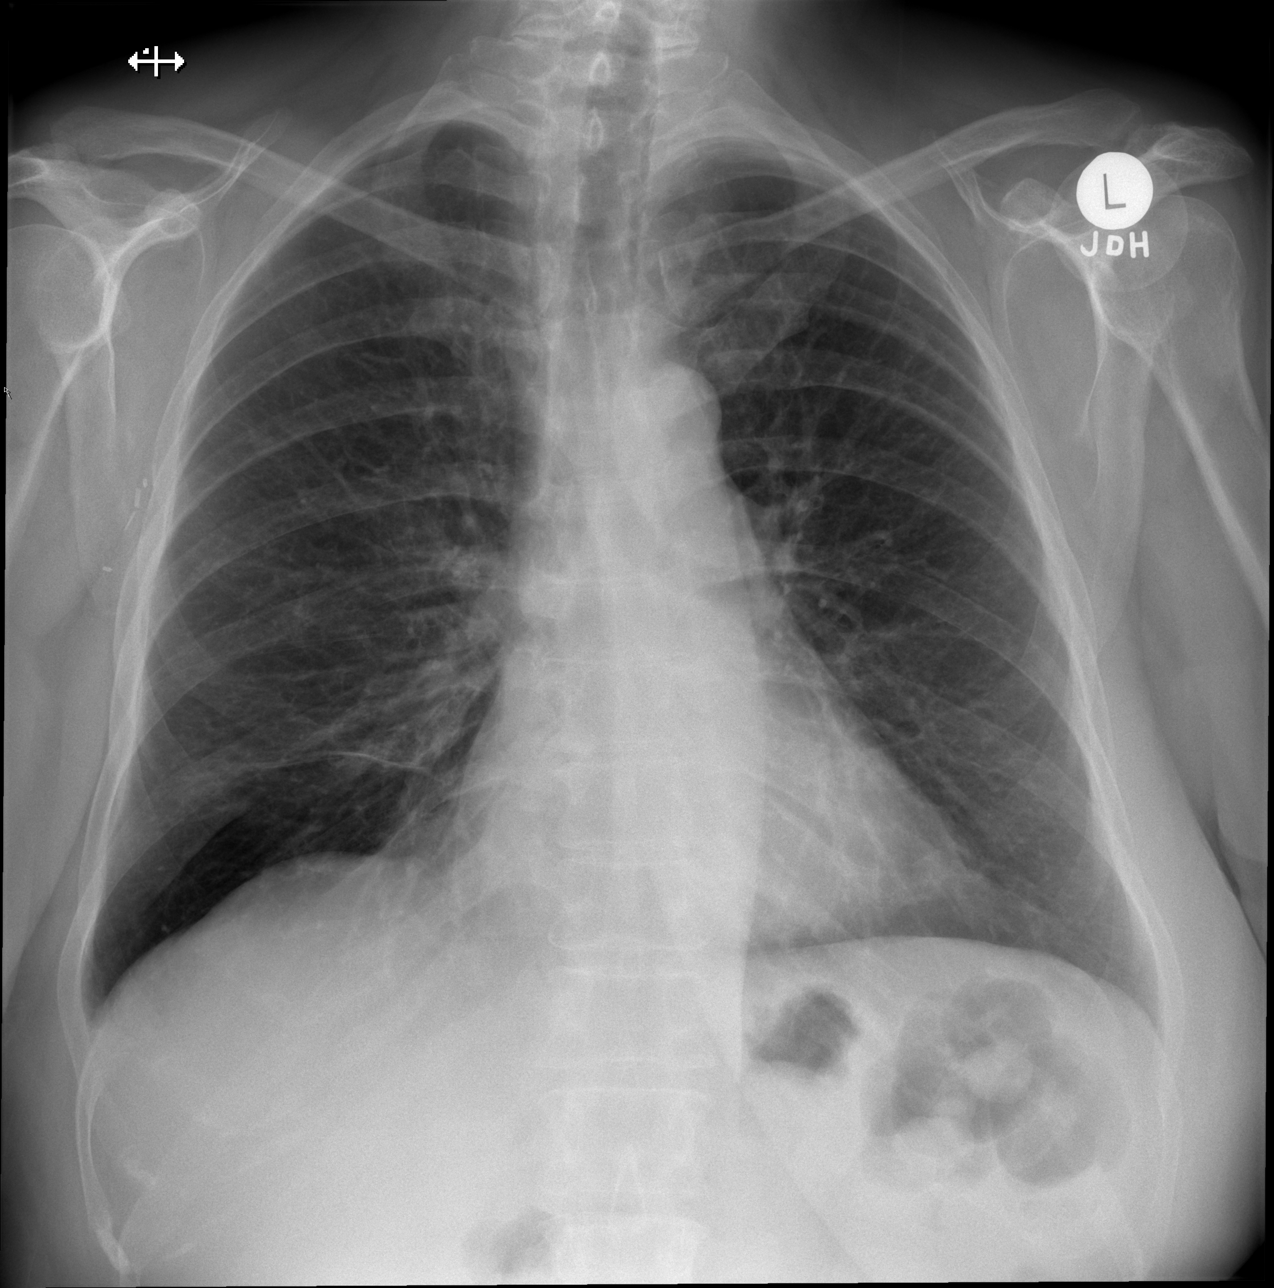

[w ribs ap lower right]
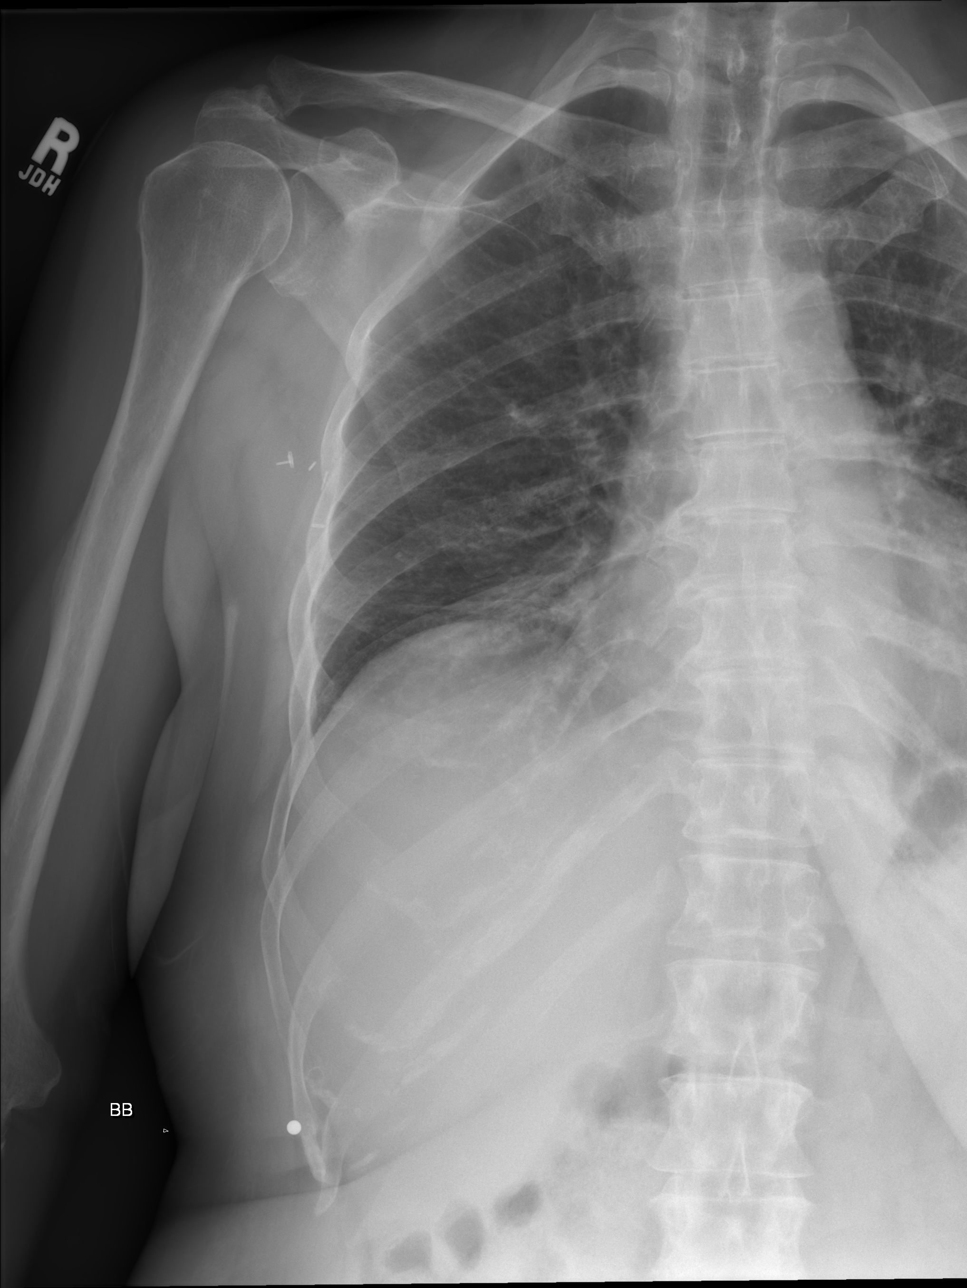

[w ribs obl right]
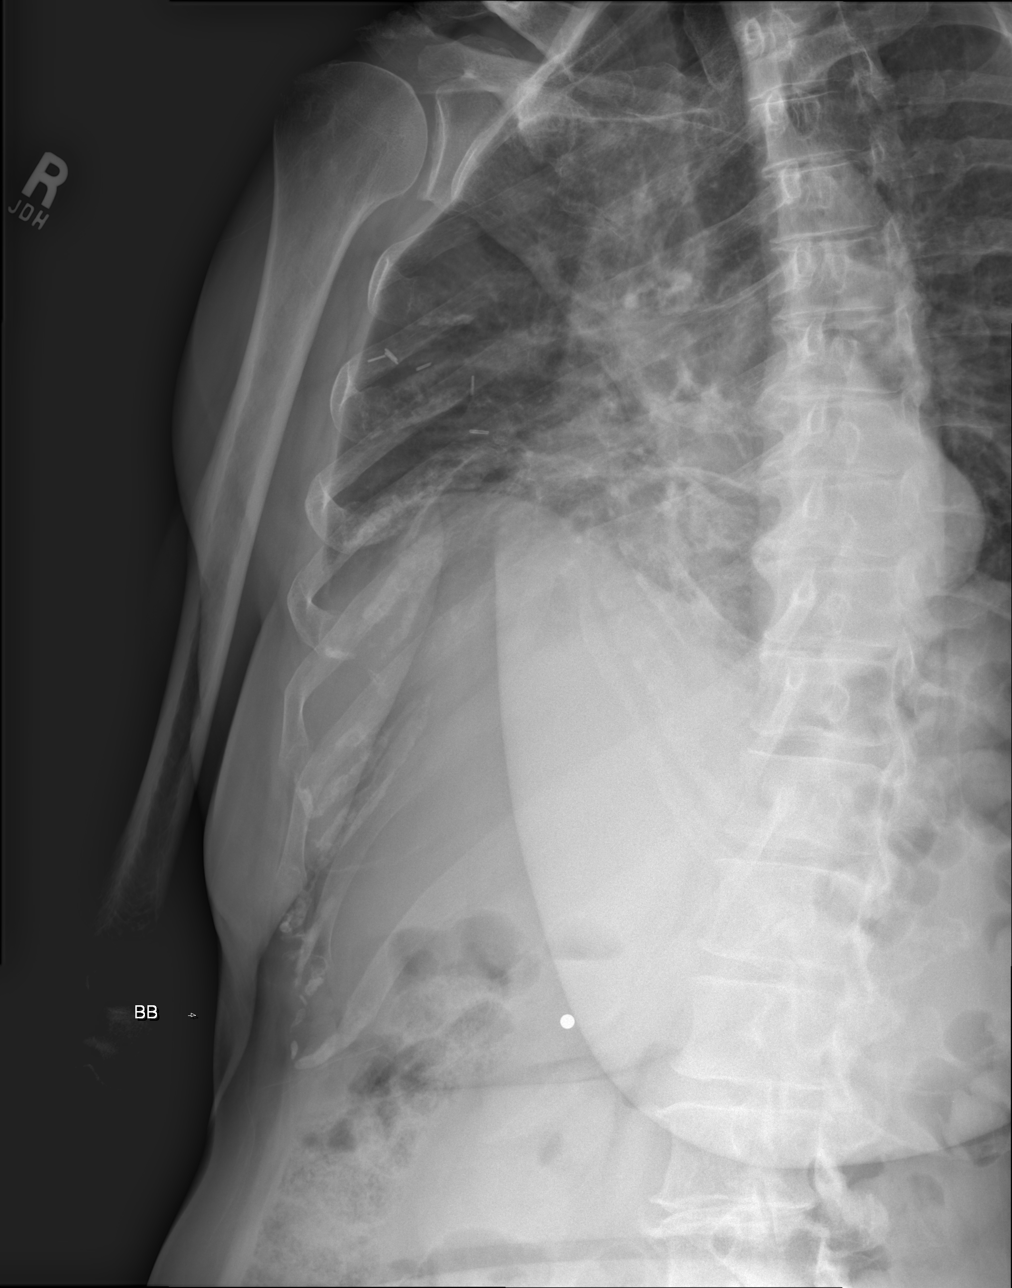

[3 of 3 positions shown; findings below may reference images not displayed]

FINDINGS: The patient is status post right mastectomy and axillary dissection.
The lungs are clear. No pneumothorax pleural effusion. Heart size is
normal. No fracture or focal bony lesion is identified.
IMPRESSION: No acute abnormality or finding to explain the patient's symptoms.

Status post right mastectomy and axillary dissection.

## 2016-06-28 ENCOUNTER — Other Ambulatory Visit: Payer: Self-pay | Admitting: *Deleted

## 2016-06-28 DIAGNOSIS — C50411 Malignant neoplasm of upper-outer quadrant of right female breast: Secondary | ICD-10-CM

## 2016-06-28 DIAGNOSIS — Z170 Estrogen receptor positive status [ER+]: Principal | ICD-10-CM

## 2016-07-10 IMAGING — CT CT CHEST W/ CM
2 of 5 series · 16 of 46 positions shown, 18 images · IV contrast (OMNIPAQUE)
Comparison: 11/21/2014

ADDENDUM:
Clinical research values were measured using RECIST protocol 1.0.
CLINICAL DATA: History of right mastectomy for breast cancer.
Recist protocol.

EXAM:
CT CHEST, ABDOMEN AND PELVIS WITH CONTRAST
TECHNIQUE: Multidetector CT imaging of the chest, abdomen and pelvis was
performed using the standard protocol following bolus administration
of intravenous contrast.
CONTRAST:  100mL OMNIPAQUE IOHEXOL 300 MG/ML  SOLN

[Series 2: cap with st · axial · 0.71mm/px · z∈[-599,-49]mm · 13 of 124 slices shown, 15 images]
[im 7/124  soft-tissue]
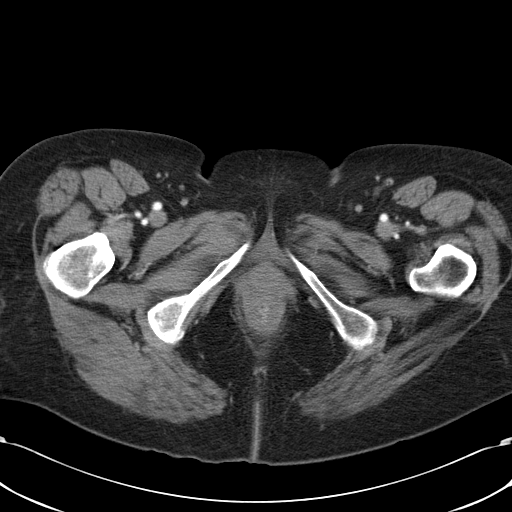
[im 7/124  bone]
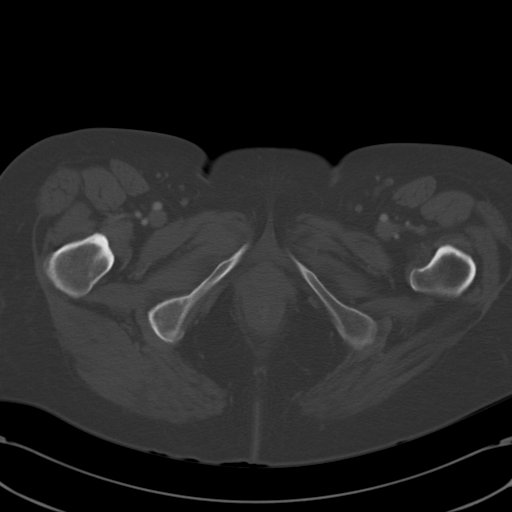
[im 14/124  soft-tissue]
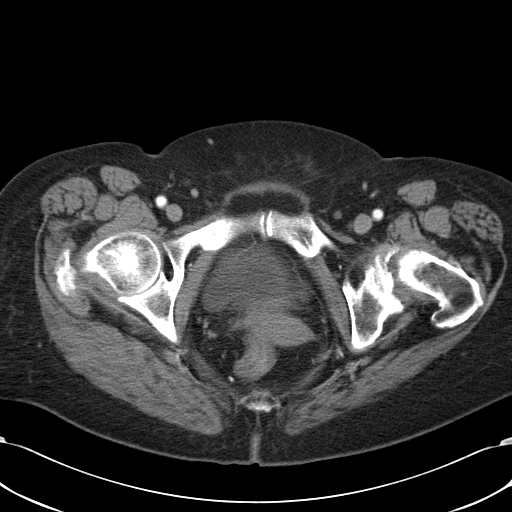
[im 28/124  soft-tissue]
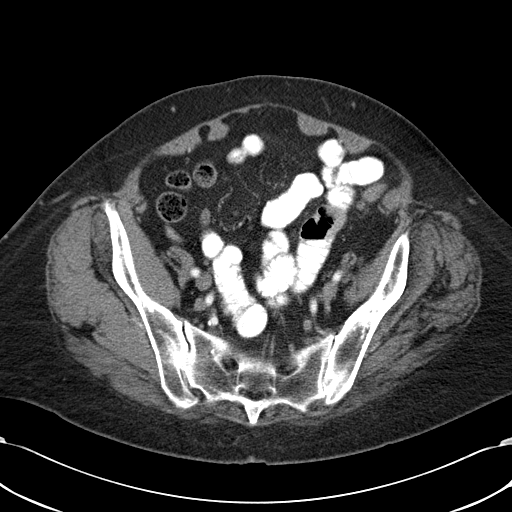
[im 35/124  soft-tissue]
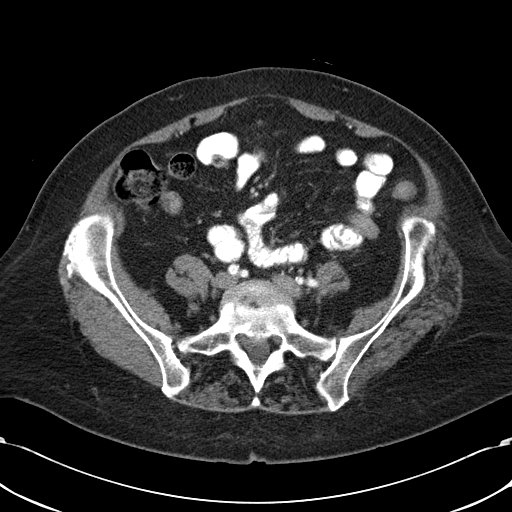
[im 42/124  soft-tissue]
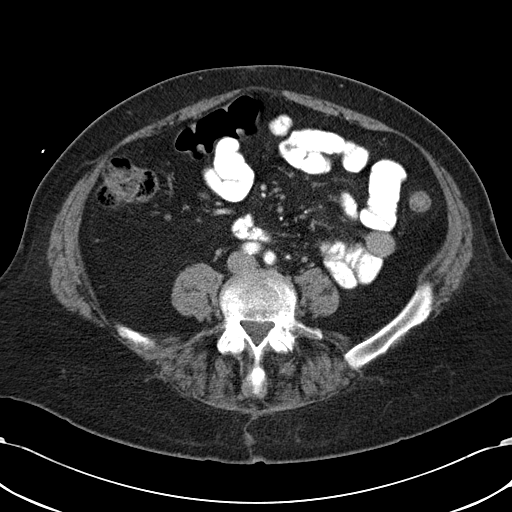
[im 55/124  soft-tissue]
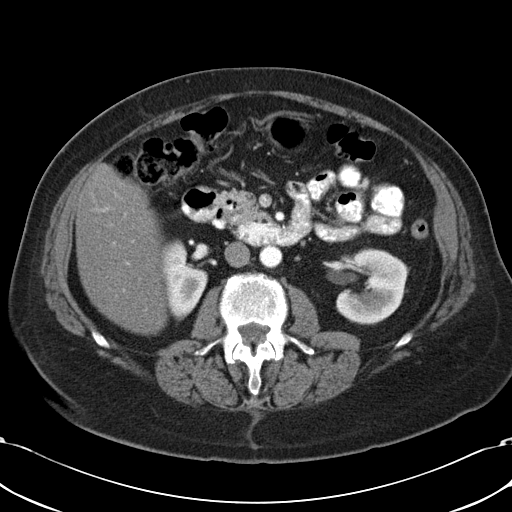
[im 62/124  soft-tissue]
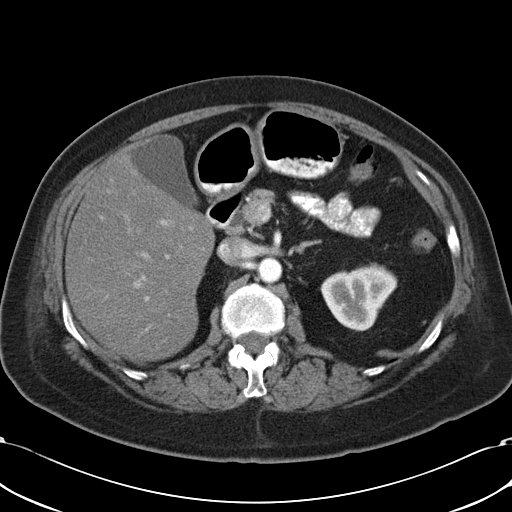
[im 69/124  soft-tissue]
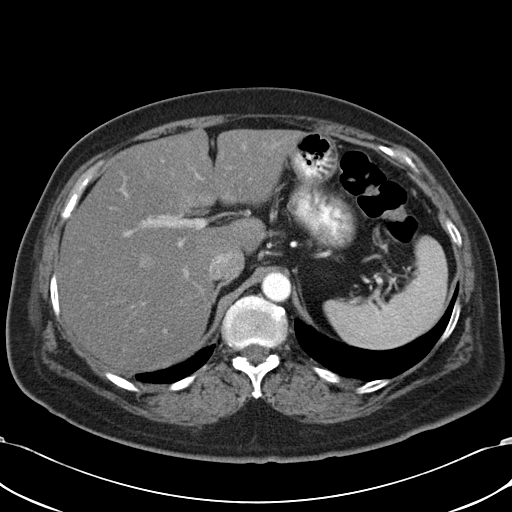
[im 83/124  soft-tissue]
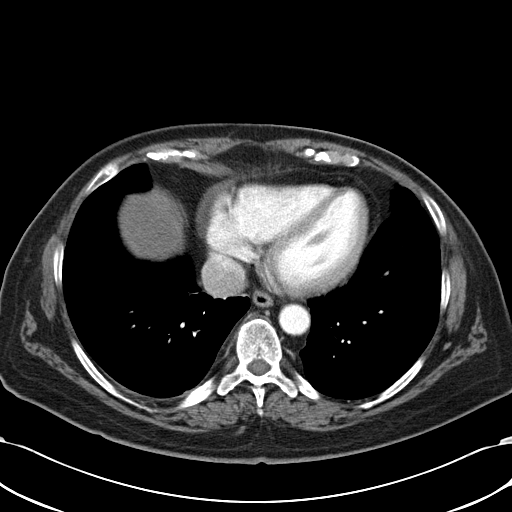
[im 83/124  bone]
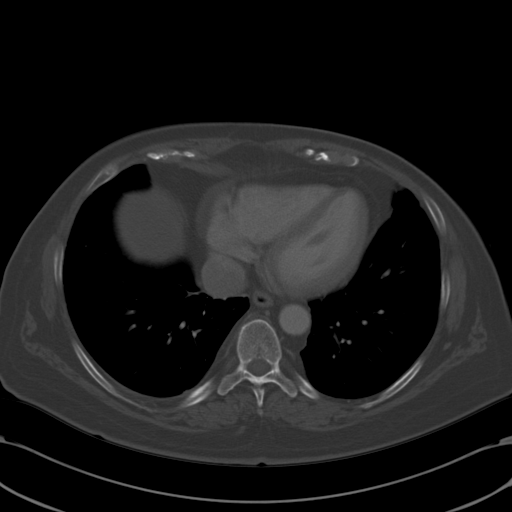
[im 89/124  soft-tissue]
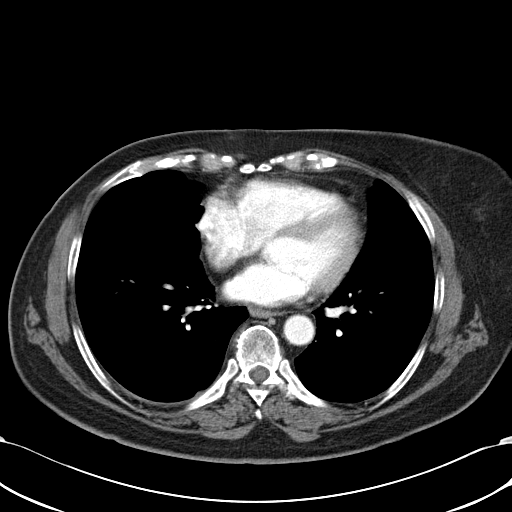
[im 96/124  soft-tissue]
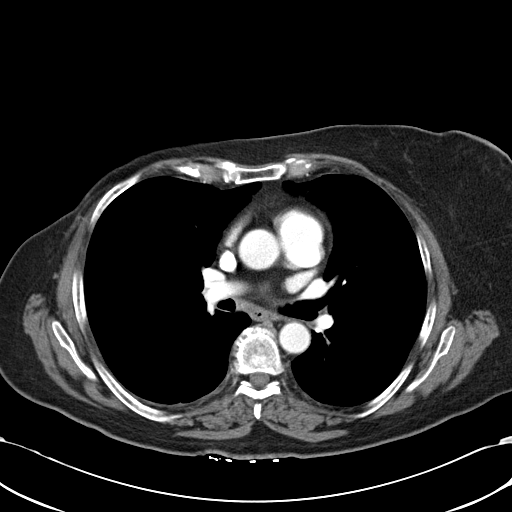
[im 110/124  soft-tissue]
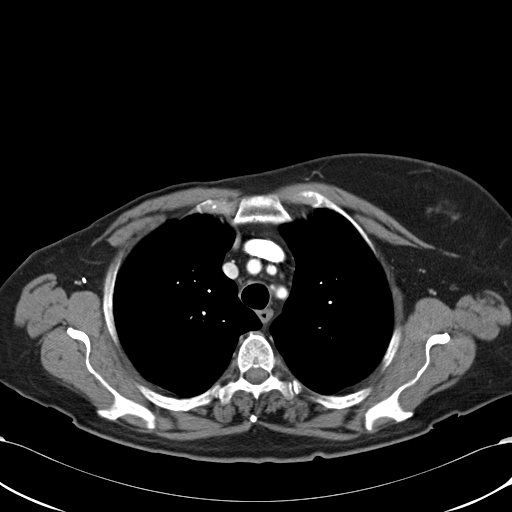
[im 117/124  soft-tissue]
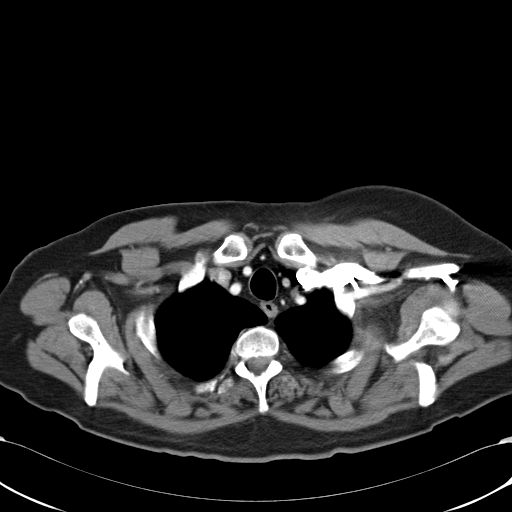

[Series 603: coronals · coronal · 1.20mm/px · 3 of 88 slices shown]
[im 30/88  soft-tissue]
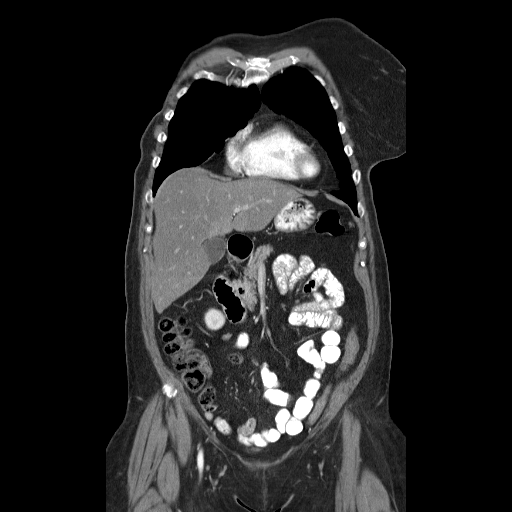
[im 39/88  soft-tissue]
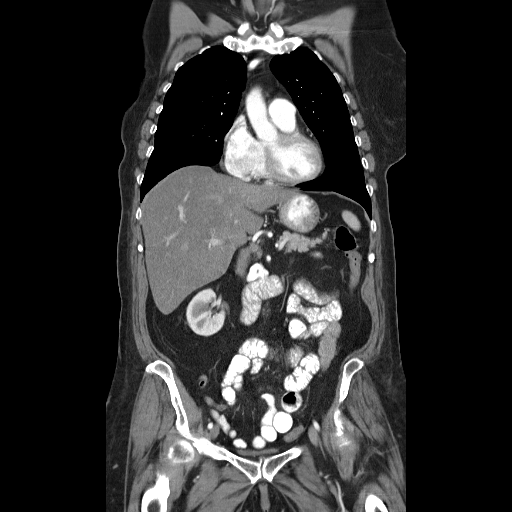
[im 49/88  soft-tissue]
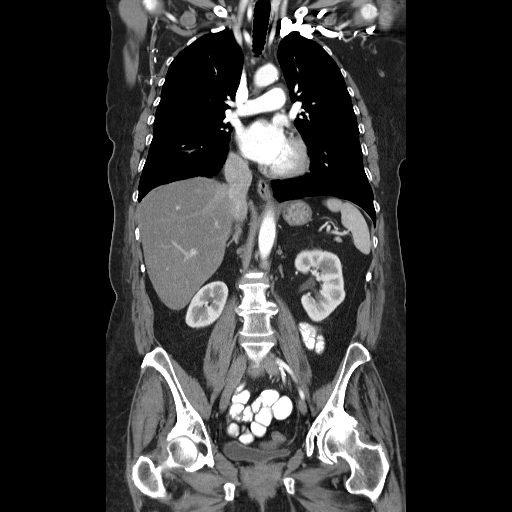

[16 of 46 positions shown; findings below may reference images not displayed]

FINDINGS: Recist

Target lesions:

1. Vague semi-solid pulmonary nodule in the right lower lobe on
image number 38 measures 6 mm. No change.
2. Subcarinal calcified lymph node on image number 27 measures 6 mm.
No change.
3. Right hilar lymph node on image number 27 measures 8 mm. No
change.
Non target lesions:

None.

Chest:

Stable surgical changes from a right mastectomy. The left breast
appears normal. No mass or adenopathy. No supraclavicular or
axillary adenopathy.

The heart is normal in size. No pericardial effusion. Small
mediastinal and hilar lymph nodes as above. No new adenopathy. The
aorta is normal and stable. The esophagus is normal.

Stable vague 6 mm right lower lobe pulmonary nodule as above. No new
pulmonary lesions. No acute pulmonary findings. No pleural effusion.

Abdomen/pelvis:

Diffuse fatty infiltration of the liver is again demonstrated with a
probable small vascular shunt noted in segment 8. No worrisome
hepatic lesions to suggest metastatic disease. Focal fatty sparing
near the gallbladder fossa is noted. The gallbladder demonstrates a
focal area of slight nodularity which could represent focal adenoma
Felner Ceola. Possible gallstones. No common bile duct dilatation.

The pancreas demonstrates chronic atrophy but no acute findings.

The spleen is normal in size.  No focal lesions.

The adrenal glands and kidneys are stable. There is a stable small
adenoma associated with the left adrenal gland unchanged since 0580.
No worrisome renal lesions or hydronephrosis.

The stomach, duodenum, small bowel and colon are unremarkable. No
inflammatory changes, mass lesions or obstructive findings. The
appendix is slightly prominent but this is a stable finding. No mass
or inflammation. The terminal ileum is normal.

The aorta demonstrates stable atherosclerotic calcifications. The
branch vessels are patent. The major venous structures are patent.
No mesenteric or retroperitoneal mass or adenopathy. Stable
periportal and celiac axis lymph nodes.

The uterus and ovaries are unremarkable. No pelvic mass or
adenopathy. The bladder appears normal. No inguinal mass or
adenopathy.

Overall stable CT appearance of the osseous structures. Advanced
degenerative changes involving the left hip are again noted. There
are few stable small scattered sclerotic lesions (left iliac bone,
left sacrum) likely treated metastatic disease.
IMPRESSION: Stable CT appearance of the chest, abdomen and pelvis. No findings
for progressive metastatic disease.

## 2016-07-16 ENCOUNTER — Ambulatory Visit (HOSPITAL_COMMUNITY)
Admission: RE | Admit: 2016-07-16 | Discharge: 2016-07-16 | Disposition: A | Discharge status: Home / Self Care | Payer: Medicare Other | Source: Ambulatory Visit | Attending: Oncology | Admitting: Oncology

## 2016-07-16 ENCOUNTER — Other Ambulatory Visit (HOSPITAL_BASED_OUTPATIENT_CLINIC_OR_DEPARTMENT_OTHER): Payer: Medicare Other

## 2016-07-16 ENCOUNTER — Encounter: Payer: Self-pay | Admitting: *Deleted

## 2016-07-16 ENCOUNTER — Encounter (HOSPITAL_COMMUNITY): Payer: Self-pay

## 2016-07-16 DIAGNOSIS — I318 Other specified diseases of pericardium: Secondary | ICD-10-CM | POA: Insufficient documentation

## 2016-07-16 DIAGNOSIS — C7800 Secondary malignant neoplasm of unspecified lung: Principal | ICD-10-CM

## 2016-07-16 DIAGNOSIS — I3131 Malignant pericardial effusion in diseases classified elsewhere: Secondary | ICD-10-CM

## 2016-07-16 DIAGNOSIS — C50911 Malignant neoplasm of unspecified site of right female breast: Secondary | ICD-10-CM

## 2016-07-16 DIAGNOSIS — I313 Pericardial effusion (noninflammatory): Secondary | ICD-10-CM

## 2016-07-16 DIAGNOSIS — E782 Mixed hyperlipidemia: Secondary | ICD-10-CM | POA: Diagnosis not present

## 2016-07-16 DIAGNOSIS — C801 Malignant (primary) neoplasm, unspecified: Secondary | ICD-10-CM | POA: Insufficient documentation

## 2016-07-16 DIAGNOSIS — K760 Fatty (change of) liver, not elsewhere classified: Secondary | ICD-10-CM | POA: Diagnosis not present

## 2016-07-16 DIAGNOSIS — Z170 Estrogen receptor positive status [ER+]: Secondary | ICD-10-CM | POA: Diagnosis not present

## 2016-07-16 DIAGNOSIS — C50411 Malignant neoplasm of upper-outer quadrant of right female breast: Secondary | ICD-10-CM | POA: Insufficient documentation

## 2016-07-16 LAB — LIPID PANEL
CHOLESTEROL TOTAL: 281 mg/dL — AB (ref 100–199)
Chol/HDL Ratio: 5.4 ratio units — ABNORMAL HIGH (ref 0.0–4.4)
HDL: 52 mg/dL (ref >39)
LDL Calculated: 176 mg/dL — ABNORMAL HIGH (ref 0–99)
TRIGLYCERIDES: 267 mg/dL — AB (ref 0–149)
VLDL Cholesterol Cal: 53 mg/dL — ABNORMAL HIGH (ref 5–40)

## 2016-07-16 LAB — CBC WITH DIFFERENTIAL/PLATELET
BASO%: 0.6 % (ref 0.0–2.0)
Basophils Absolute: 0 10*3/uL (ref 0.0–0.1)
EOS%: 2.2 % (ref 0.0–7.0)
Eosinophils Absolute: 0.1 10*3/uL (ref 0.0–0.5)
HEMATOCRIT: 40 % (ref 34.8–46.6)
HEMOGLOBIN: 13.3 g/dL (ref 11.6–15.9)
LYMPH#: 1 10*3/uL (ref 0.9–3.3)
LYMPH%: 16.4 % (ref 14.0–49.7)
MCH: 26.3 pg (ref 25.1–34.0)
MCHC: 33.3 g/dL (ref 31.5–36.0)
MCV: 79.2 fL — ABNORMAL LOW (ref 79.5–101.0)
MONO#: 0.8 10*3/uL (ref 0.1–0.9)
MONO%: 12.4 % (ref 0.0–14.0)
NEUT#: 4.4 10*3/uL (ref 1.5–6.5)
NEUT%: 68.4 % (ref 38.4–76.8)
PLATELETS: 260 10*3/uL (ref 145–400)
RBC: 5.05 10*6/uL (ref 3.70–5.45)
RDW: 14.3 % (ref 11.2–14.5)
WBC: 6.4 10*3/uL (ref 3.9–10.3)

## 2016-07-16 LAB — COMPREHENSIVE METABOLIC PANEL
ALBUMIN: 3.2 g/dL — AB (ref 3.5–5.0)
ALK PHOS: 271 U/L — AB (ref 40–150)
ALT: 44 U/L (ref 0–55)
ANION GAP: 11 meq/L (ref 3–11)
AST: 48 U/L — ABNORMAL HIGH (ref 5–34)
BILIRUBIN TOTAL: 0.35 mg/dL (ref 0.20–1.20)
BUN: 21.3 mg/dL (ref 7.0–26.0)
CALCIUM: 9.8 mg/dL (ref 8.4–10.4)
CHLORIDE: 107 meq/L (ref 98–109)
CO2: 21 mEq/L — ABNORMAL LOW (ref 22–29)
CREATININE: 0.9 mg/dL (ref 0.6–1.1)
EGFR: 70 mL/min/{1.73_m2} — ABNORMAL LOW (ref >90)
Glucose: 184 mg/dl — ABNORMAL HIGH (ref 70–140)
Potassium: 4 mEq/L (ref 3.5–5.1)
Sodium: 138 mEq/L (ref 136–145)
TOTAL PROTEIN: 8 g/dL (ref 6.4–8.3)

## 2016-07-16 MED ORDER — IOPAMIDOL (ISOVUE-300) INJECTION 61%
100.0000 mL | Freq: Once | INTRAVENOUS | Status: AC | PRN
  Administered 2016-07-16: 75 mL via INTRAVENOUS

## 2016-07-16 MED ORDER — IOPAMIDOL (ISOVUE-300) INJECTION 61%
INTRAVENOUS | Status: AC
  Filled 2016-07-16: qty 100

## 2016-07-16 NOTE — Progress Notes (Signed)
07/16/16 at 11:02am - The pt was into the cancer center this morning for her labs.  Dr. Jana Hakim wanted the pt to have her CT chest done today.  Dr. Jana Hakim will see the pt in January 2018.  The pt stated that she has been doing great.  She will be traveling to visit with her sister in [place] for 2 weeks.  Dr. Jana Hakim wanted to check the pt's lipid panel since she remains on study drug. The pt's CT scan will be read today using RECIST 1.0.   Brion Aliment RN, BSN, CCRP  Clinical Research Nurse 07/16/2016 11:07 AM   08/08/16 at 9:48am- [employer]/Bolero 4 Extension phase #4- The pt was into the cancer center this morning for her MD appt and to return/dispense her study drug, everolimus.  The pt said that she had a great time visiting family in [place] over the holidays.  She said she was very active.  She returned her 6 kits of everolimus.  She said that she missed one day of study drug (date unknown per pt).  The pt was dispensed 168 pills on 05/16/16, and she returned only 2 unopened pills today.  Therefore, the pt was 98.8% compliant with her study drug. The pt reports that she is feeling great and denies any new complaints. She reports the following AE's as ongoing: hot flashes, memory impairment, fatigue, dry skin, and back pain. She was seen and examined by Dr. Jana Hakim. He reviewed her labs from December 2017 and her CT chest from December.  He stated that in his opinion she is still receiving benefit from her study treatment. He stated that he does not want to make any changes to her current treatment plan. The pt was dispensed 6 kits of study drug today to last her for 12 weeks.  The pt confirmed that she is taking her letrozole 2.5 mg daily with her everolimus. The pt is aware of her new appointment on 10/31/16. Dr. Jana Hakim stated that he will re-scan her with a CT/chest in June  2018. The pt was in agreement with this plan. The pt's ECOG =0. The pt is able to do all of her  normal activities. The pt's concomitant medications were reviewed with her, and she denies any new medications.   The pt was thanked for her continued support and compliance on the study.  The pt is very excited to celebrate her 5 year anniversary on the study in March 2018.   Brion Aliment RN, BSN, CCRP Clinical Research Nurse 08/08/2016 11:04 AM

## 2016-07-17 LAB — CANCER ANTIGEN 27.29: CAN 27.29: 59.2 U/mL — AB (ref 0.0–38.6)

## 2016-08-08 ENCOUNTER — Other Ambulatory Visit: Payer: Medicare Other

## 2016-08-08 ENCOUNTER — Ambulatory Visit (HOSPITAL_BASED_OUTPATIENT_CLINIC_OR_DEPARTMENT_OTHER): Payer: Medicare Other | Admitting: Oncology

## 2016-08-08 VITALS — BP 195/59 | HR 94 | Temp 98.2°F | Ht 64.0 in | Wt 163.6 lb

## 2016-08-08 DIAGNOSIS — C801 Malignant (primary) neoplasm, unspecified: Secondary | ICD-10-CM

## 2016-08-08 DIAGNOSIS — C50411 Malignant neoplasm of upper-outer quadrant of right female breast: Secondary | ICD-10-CM | POA: Diagnosis not present

## 2016-08-08 DIAGNOSIS — C7800 Secondary malignant neoplasm of unspecified lung: Secondary | ICD-10-CM

## 2016-08-08 DIAGNOSIS — Z170 Estrogen receptor positive status [ER+]: Secondary | ICD-10-CM

## 2016-08-08 DIAGNOSIS — Z006 Encounter for examination for normal comparison and control in clinical research program: Secondary | ICD-10-CM

## 2016-08-08 DIAGNOSIS — I3131 Malignant pericardial effusion in diseases classified elsewhere: Secondary | ICD-10-CM

## 2016-08-08 DIAGNOSIS — I318 Other specified diseases of pericardium: Secondary | ICD-10-CM | POA: Diagnosis not present

## 2016-08-08 DIAGNOSIS — C773 Secondary and unspecified malignant neoplasm of axilla and upper limb lymph nodes: Secondary | ICD-10-CM

## 2016-08-08 DIAGNOSIS — Z79811 Long term (current) use of aromatase inhibitors: Secondary | ICD-10-CM | POA: Diagnosis not present

## 2016-08-08 DIAGNOSIS — I313 Pericardial effusion (noninflammatory): Secondary | ICD-10-CM

## 2016-08-08 DIAGNOSIS — C50911 Malignant neoplasm of unspecified site of right female breast: Secondary | ICD-10-CM

## 2016-08-08 MED ORDER — INV-EVEROLIMUS (RAD0001) 5MG TABLET NOVARTIS CRAD001Y24135: 2.0000 | ORAL_TABLET | Freq: Every day | ORAL | 0 refills | Status: DC

## 2016-08-08 NOTE — Progress Notes (Signed)
[place]  Telephone:(336) [phone] Fax:(336) [phone]     ID: ELADIA FRAME DOB: [birth year]  MR#: [id number]  TFT#:[id number]  Patient Care Team: Darcus Austin, MD as PCP - General (Family Medicine) Jerline Pain, MD (Cardiology) Gaye Pollack, MD (Cardiothoracic Surgery) Chauncey Cruel, MD as Consulting Physician (Oncology) OTHER MD: Dr Erroll Luna- Merlene Laughter, DDS; Gaynelle Arabian MD  CHIEF COMPLAINT: metastatic breast cancer, on BOLERO study  CURRENT TREATMENT: letrozole + everolimus  BREAST CANCER HISTORY: From Dr. Collier [name]'s original intake note 04/13/2004:  "This woman has been in good health all of her life. She recently moved from [place] to work here.  She palpated a mass at about the 12 o'clock position in mid-July. She has not noticed any nipple retraction or skin changes.  She was seen by her primary care doctor who subsequently referred her for a mammogram.  Mammogram was performed on 03/01/04. This demonstrated a spiculated 2 cm mass at the 12 o'clock position in the right breast.  Upper outer quadrant of the left breast shows some distortion.   Physical exam at that time showed a firm, nontender nodule at the 12 o'clock position in the right breast, 5 cm from the nipple.  Ultrasound of this area showed a hypoechoic ill-defined mass, measuring 2.2 x 1.3 x 1.4 cm.  Physical exam of the left breast showed general vague thickening, upper outer quadrant of the left breast with a discrete palpable mass. The ultrasound performed showed a single hypoechoic ill-defined nodule at the 1 o'clock position, measuring 7 x 5 x 6 mm.  She had biopsies of both lesions on 03/02/03.  Needle core biopsy of the lesion on the right breast revealed invasive mammary carcinoma.  Needle core biopsy of the left breast showed a complex fibroadenoma.  Prognostic panel of the lesion on the right breast showed it to be ER positive at 73%, PR positive at 90% and proliferative index 9%, HER-2  was 1+.  Patient was referred to Dr. Annamaria Boots, who performed a simple mastectomy with sentinel lymph node evaluation on 03/22/04.  Final pathology showed this to be an invasive ductal carcinoma  with lobular features, measuring 2.2 cm, grade 2 of 3.  Margins negative for carcinoma.  Invasive ductal carcinoma was extended to involve deep dermis of the nipple.  Lymphovascular invasion was identified.  Total of 4 sentinel lymph nodes were evaluated.  Touch imprints at the time of the OR was felt to be negative.  Subsequent evaluation showed a 5 mm focus of metastatic carcinoma in one of the four lymph nodes on microscopic after sectioning.  There was extracapsular extension  of one lymph node as well. The remaining three lymph nodes were all negative."  The patient's subsequent history is detailed below.  INTERVAL HISTORY: Averianna returns today for follow-up of her metastatic estrogen receptor positive breast cancer. Research nurse [name] was also present during today's visit.  Intisar is doing remarkably well, especially now that she has recovered from her hip surgery. She tells me she is planning to paint her bathroom. She recently played simulated golf with her brother and enjoyed it quite a bit.  She is tolerating the letrozole with no side effects that she is aware of.  Hot flashes and vaginal dryness are not a major issue. She never developed the arthralgias or myalgias that many patients can experience on this medication. She obtains it at a good price.  She is doing equally well with the everolimus. In particular  she denies mouth sores, cough, or significant fatigue. She does admit to some fatigue for example after she played 11 holes of simulated golf she felt any to sit down for a little bit   REVIEW OF SYSTEMS: She denies pain, fever, rash, bleeding, or unexplained weight loss. There have been no unusual headaches, visual changes, nausea, vomiting, phlegm production, pleurisy, or change in  bowel or bladder habits. A detailed review of systems today was stable  PAST MEDICAL HISTORY: Past Medical History:  Diagnosis Date  . Arthritis    left hip  . Asthma   . breast ca 2005   breast/chemo R mastectomy  . GERD (gastroesophageal reflux disease)   . Hypercholesteremia   . Metastasis to lung ([place]) dx'd 08/2011  . Osteopenia due to cancer therapy 09/09/2013  . Palpitations   . Shortness of breath     PAST SURGICAL HISTORY: Past Surgical History:  Procedure Laterality Date  . BREAST SURGERY  2005   right  . CATARACT EXTRACTION W/PHACO Left 01/18/2014   Procedure: CATARACT EXTRACTION PHACO AND INTRAOCULAR LENS PLACEMENT (IOC);  Surgeon: Elta Guadeloupe T. Gershon Crane, MD;  Location: [facility];  Service: Ophthalmology;  Laterality: Left;  CDE 15.79  . CATARACT EXTRACTION W/PHACO Right 02/08/2014   Procedure: CATARACT EXTRACTION PHACO AND INTRAOCULAR LENS PLACEMENT (IOC);  Surgeon: Elta Guadeloupe T. Gershon Crane, MD;  Location: [facility];  Service: Ophthalmology;  Laterality: Right;  CDE 4.41  . CHEST TUBE INSERTION  09/11/2011   Procedure: INSERTION PLEURAL DRAINAGE CATHETER;  Surgeon: Gaye Pollack, MD;  Location: [facility];  Service: Thoracic;  Laterality: Right;  . PERICARDIAL WINDOW  09/11/2011   Procedure: PERICARDIAL WINDOW;  Surgeon: Gaye Pollack, MD;  Location: [facility] OR;  Service: Thoracic;  Laterality: N/A;  . REMOVAL OF PLEURAL DRAINAGE CATHETER  12/19/2011   Procedure: REMOVAL OF PLEURAL DRAINAGE CATHETER;  Surgeon: Gaye Pollack, MD;  Location: [facility];  Service: Thoracic;  Laterality: Right;  TO BE DONE IN MINOR ROOM, SHORT STAY  . [place]  . TOTAL HIP ARTHROPLASTY Left 06/07/2015   Procedure: LEFT TOTAL HIP ARTHROPLASTY ANTERIOR APPROACH;  Surgeon: Gaynelle Arabian, MD;  Location: [facility];  Service: Orthopedics;  Laterality: Left;  Marland Kitchen VIDEO BRONCHOSCOPY  09/11/2011   Procedure: VIDEO BRONCHOSCOPY;  Surgeon: Gaye Pollack, MD;  Location: [facility] OR;  Service: Thoracic;  Laterality: N/A;    FAMILY  HISTORY Family History  Problem Relation Age of Onset  . Cancer Mother     breast  . Heart disease Father   . Anesthesia problems Neg Hx    the patient's mother was diagnosed with breast cancer at age 75, she died age 21 from congestive heart failure..The patient's father died from heart disease at age 82.  She has one sister alive & well.  Two brothers alive & well, one with diabetes. The patient's sister was also diagnosed with breast cancer, and was tested for the BRCA gene, and was negative. The patient herself has not been tested. There is no history of ovarian cancer in the family  GYNECOLOGIC HISTORY:  No LMP recorded. Patient is postmenopausal. Menarche age 24, the patient is GX P0. She stopped having periods with chemotherapy in 2005. 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 She was a Archivist. She is single, lives alone with her Shitzu-poodle Sammie.. Family is all in the [place] area.  ADVANCED DIRECTIVES: Not in place. At the 02/23/2014 visit the patient was given the appropriate documents to complete and notarize at her discretion. She tells me she is planning to name her sister, Billie Ruddy, as healthcare power of attorney. [name] can be reached at [phone]   HEALTH MAINTENANCE: Social History  Substance Use Topics  . Smoking status: Former Smoker    Packs/day: 1.50    Years: 30.00    Types: Cigarettes    Quit date: 09/10/2007  . Smokeless tobacco: Not on file  . Alcohol use No     Colonoscopy:  PAP:  Bone density: 08/14/2015, T score -1.6  Lipid panel:  Allergies  Allergen Reactions  . Aspirin     REACTION: upset stomach  . Latex Other (See Comments)    Blistering and skin peels off  . Nsaids Nausea And Vomiting    Extreme nausea and vomiting    Current Outpatient Prescriptions  Medication Sig Dispense Refill  .  cholecalciferol (VITAMIN D) 1000 units tablet Take 1,000 Units by mouth 2 (two) times daily.    Marland Kitchen gemfibrozil (LOPID) 600 MG tablet Take 1 tablet (600 mg total) by mouth 2 (two) times daily before a meal. 180 tablet 12  . letrozole (FEMARA) 2.5 MG tablet Take 1 tablet (2.5 mg total) by mouth daily. 90 tablet 3  . loperamide (IMODIUM) 2 MG capsule Take 2 mg by mouth 4 (four) times daily as needed for diarrhea or loose stools. Reported on 11/30/2015    . metoprolol tartrate (LOPRESSOR) 25 MG tablet Take 0.5 tablets (12.5 mg total) by mouth daily. 30 tablet 4  . omeprazole (PRILOSEC) 40 MG capsule Take 1 capsule (40 mg total) by mouth daily as needed. 90 capsule 3  . ondansetron (ZOFRAN) 8 MG tablet Take 1 tablet (8 mg total) by mouth 2 (two) times daily as needed (Nausea or vomiting). (Patient not taking: Reported on 09/20/2015) 30 tablet 1  . prochlorperazine (COMPAZINE) 25 MG suppository Place 1 suppository (25 mg total) rectally every 12 (twelve) hours as needed for nausea. (Patient not taking: Reported on 09/20/2015) 12 suppository 3   No current facility-administered medications for this visit.     OBJECTIVE: Middle-aged white woman in no acute distress Vitals:   08/08/16 1003 08/08/16 1008  BP: 119/60 (!) 195/59  Pulse:    Temp:       Body mass index is 28.08 kg/m.    ECOG FS:0 - Asymptomatic  Sclerae unicteric, EOMs intact Oropharynx clear and moist No cervical or supraclavicular adenopathy Lungs no rales or rhonchi Heart regular rate and rhythm Abd soft, nontender, positive bowel sounds MSK no focal spinal tenderness, no upper extremity lymphedema Neuro: nonfocal, well oriented, appropriate affect Breasts: The right breast is status post mastectomy. There is no evidence of local recurrence. The left breast is unremarkable.   LAB  RESULTS:  CMP     Component Value Date/Time   NA 138 07/16/2016 0933   K 4.0 07/16/2016 0933   CL 106 09/20/2015 1257   CL 109 (H) 12/31/2012 0940    CO2 21 (L) 07/16/2016 0933   GLUCOSE 184 (H) 07/16/2016 0933   GLUCOSE 127 (H) 12/31/2012 0940   BUN 21.3 07/16/2016 0933   CREATININE 0.9 07/16/2016 0933   CALCIUM 9.8 07/16/2016 0933   PROT 8.0 07/16/2016 0933   ALBUMIN 3.2 (L) 07/16/2016 0933   AST 48 (H) 07/16/2016 0933   ALT 44 07/16/2016 0933   ALKPHOS 271 (H) 07/16/2016 0933   BILITOT  0.35 07/16/2016 0933   GFRNONAA >60 09/20/2015 1257   GFRAA >60 09/20/2015 1257    I No results found for: SPEP  Lab Results  Component Value Date   WBC 6.4 07/16/2016   NEUTROABS 4.4 07/16/2016   HGB 13.3 07/16/2016   HCT 40.0 07/16/2016   MCV 79.2 (L) 07/16/2016   PLT 260 07/16/2016      Chemistry      Component Value Date/Time   NA 138 07/16/2016 0933   K 4.0 07/16/2016 0933   CL 106 09/20/2015 1257   CL 109 (H) 12/31/2012 0940   CO2 21 (L) 07/16/2016 0933   BUN 21.3 07/16/2016 0933   CREATININE 0.9 07/16/2016 0933      Component Value Date/Time   CALCIUM 9.8 07/16/2016 0933   ALKPHOS 271 (H) 07/16/2016 0933   AST 48 (H) 07/16/2016 0933   ALT 44 07/16/2016 0933   BILITOT 0.35 07/16/2016 0933       Lab Results  Component Value Date   LABCA2 58 (H) 09/14/2012    No components found for: [id number]  No results for input(s): INR in the last 168 hours.  Urinalysis    Component Value Date/Time   COLORURINE YELLOW 09/20/2015 1432   APPEARANCEUR HAZY (A) 09/20/2015 1432   LABSPEC >1.030 (H) 09/20/2015 1432   LABSPEC 1.030 07/13/2015 0841   PHURINE 5.5 09/20/2015 1432   GLUCOSEU 500 (A) 09/20/2015 1432   GLUCOSEU Negative 07/13/2015 0841   HGBUR MODERATE (A) 09/20/2015 1432   BILIRUBINUR NEGATIVE 09/20/2015 1432   BILIRUBINUR Negative 07/13/2015 0841   KETONESUR NEGATIVE 09/20/2015 1432   PROTEINUR 30 (A) 09/20/2015 1432   UROBILINOGEN 0.2 07/13/2015 0841   NITRITE NEGATIVE 09/20/2015 1432   LEUKOCYTESUR NEGATIVE 09/20/2015 1432   LEUKOCYTESUR Negative 07/13/2015 0841    STUDIES: Ct Chest W  Contrast  Result Date: 07/16/2016 CLINICAL DATA:  Followup metastatic right breast carcinoma. Oral chemotherapy in progress. Restaging. EXAM: CT CHEST WITH CONTRAST TECHNIQUE: Multidetector CT imaging of the chest was performed during intravenous contrast administration. CONTRAST:  21m ISOVUE-300 IOPAMIDOL (ISOVUE-300) INJECTION 61% COMPARISON:  02/20/2016 FINDINGS: RECIST 1.0 Target Lesions: 1. Right lower lobe pulmonary nodule measuring 5 mm on image 102/5, stable since prior exam 2. Subcarinal lymph node measuring 6 mm on image 74/2, stable since prior exam Non-target Lesions: 1. Stable mild right pleural thickening. 2. Stable clustered right upper lobe pulmonary nodules, largest measuring 3 mm. Cardiovascular:  No acute findings. Mediastinum/Nodes: Previous right mastectomy and axillary lymph node dissection. No masses or pathologically enlarged lymph nodes identified. Lungs/Pleura: Mild scarring again seen within the right middle and lower lobes. 5 mm pulmonary nodule in posterior right lower lobe on image 102/5 is stable. Cluster of tiny nodules in peripheral right upper lobe are also stable, with largest measuring 3 mm on image 28/5. No new or enlarging pulmonary nodules or masses are identified. No evidence pulmonary infiltrate or pleural effusion. Mild right posterior and basilar pleural thickening remains stable. Upper Abdomen: Stable 10 mm left adrenal nodule compared to previous studies dating back to 11/02/2014, consistent with benign adrenal adenoma. Stable diffuse hepatic steatosis. Musculoskeletal:  No suspicious bone lesions. IMPRESSION: Stable exam. No definite evidence of recurrent or metastatic carcinoma within the thorax. Stable tiny sub-cm right lung nodules. Recommend continued attention on follow-up CT. Stable hepatic steatosis and small benign left adrenal adenoma. Electronically Signed   By: JEarle GellM.D.   On: 07/16/2016 15:13     ASSESSMENT: 66y.o. [place]  with stage  IV breast cancer, on BOLERO-4 trial  (1) status post right mastectomy and sentinel lymph node sampling 03/22/2004 for a right upper-outer quadrant pT2 pN1, stage IIB invasive ductal carcinoma with lobular features, grade 2, estrogen receptor and progesterone receptor positive, HER-2 negative, with an MIB-1 of 9% ([id number] and [id number])  (2) addition all right axillary lymph node sampling 05/07/2004 showed 2 benign lymph nodes (4 lymph nodes previously removed, so total was one positive lymph node out of 6; [id number])  (3) the patient was evaluated by radiation oncology; no postmastectomy radiation recommended  (4) adjuvant chemotherapy with dose dense doxorubicin and cyclophosphamide x4 cycles (first cycle delayed one week) followed by dose dense paclitaxel x4 was completed 09/18/2004  (5) tamoxifen started March 2006, discontinued 2009  METASTATIC DISEASE: (6) presenting with a large pericardial effusion, large right pleural effusion and possible right middle lobe bronchial obstruction February 2013, status post pericardial window placement, fiberoptic bronchoscopy and right Pleurx placement 09/11/2011, with biopsy of the bronchus intermedius and pericardium positive for metastatic breast cancer, estrogen receptor 91% positive with moderate staining intensity, progesterone receptor 100% positive with strong staining intensity, with an MIB-1 of 35%, and no HER-2 amplification, the signals ratio being 1.37 ([id number])  (7) enrolled in BOLERO-4 trial 10/10/2011, receiving letrozole and everolimus  (a) two small areas of enhancement in the cerebellum noted by brain MRI 10/03/2011 were no longer apparent on repeat MRI 08/21/2012-- most recent brain MRI 04/01/2014 showed no evidence of intracranial metastatic disease  (b) sclerotic lesions in left iliac bone and sacrum have not been biopsied; stable; plan is to start zolendronate after patient updates her dental care (extraction planned)  (c) RLL lung  nodule, stable (rescanned 07/12/2014 and 08/11/2014) R hilar and subcarinal lymph nodes: stable  (9) additional problems:  (a) hepatic steatosis  (b) COPD/ emphysema/ asthma  (c) advanced L hip osteoarthritis, status post left total hip replacement 06/07/2015  (d) aortoiliac atherosclerosis  (e) dental evaluation pending w possible dental extractions  (f) possible thalassemia trait  (10) Bone density concerns: DEXA scan 08/11/2013 was normal  (11) likely thalassemia trait  PLAN: Deneisha is now 5 years out from definitive diagnosis of metastatic breast cancer, with no evidence of disease activity. This is very favorable.  We reviewed the results of her repeat CT scan, which are entirely stable   She has tolerating the everolimus in particular quite well and I have reminded her that if she develops a persistent cough she needs to cause immediately  Otherwise she is going to return to see me in April. I will likely repeat a chest CT scan sometime in late June or July.  She knows to call for any problems that may develop before her next visit here. Chauncey Cruel, MD   08/08/2016 10:33 AM

## 2016-09-04 IMAGING — CT CT CHEST W/ CM
2 of 5 series · 15 of 46 positions shown, 17 images · IV contrast (OMNIPAQUE)
Comparison: Most recent CT of the chest, abdomen and pelvis from
12/27/2014.

CLINICAL DATA: 64-year-old female presenting for restaging of right
breast cancer diagnosed in 9331 status post right mastectomy, with
lung metastases diagnosed in 1563, with ongoing oral chemotherapy.

EXAM:
CT CHEST, ABDOMEN, AND PELVIS WITH CONTRAST
TECHNIQUE: Multidetector CT imaging of the chest, abdomen and pelvis was
performed following the standard protocol during bolus
administration of intravenous contrast.
CONTRAST:  100mL OMNIPAQUE IOHEXOL 300 MG/ML  SOLN

[Series 2: cap with st · axial · 0.73mm/px · z∈[-590,-44]mm · 12 of 125 slices shown, 14 images]
[im 8/125  soft-tissue]
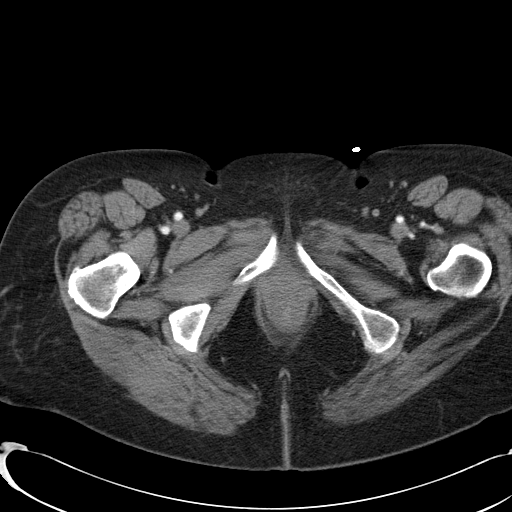
[im 8/125  bone]
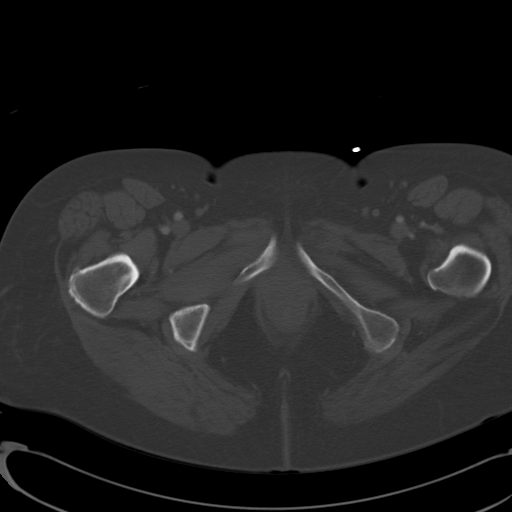
[im 22/125  soft-tissue]
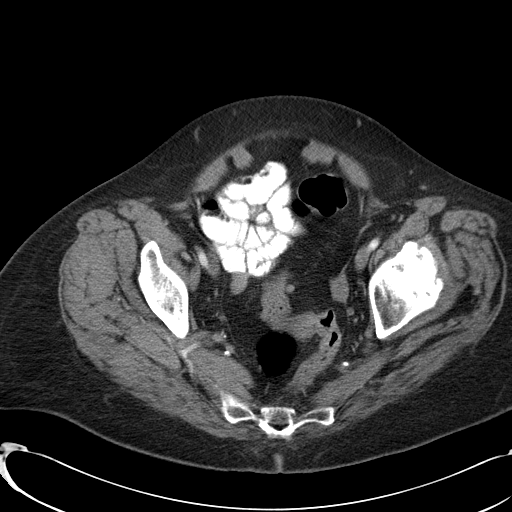
[im 30/125  soft-tissue]
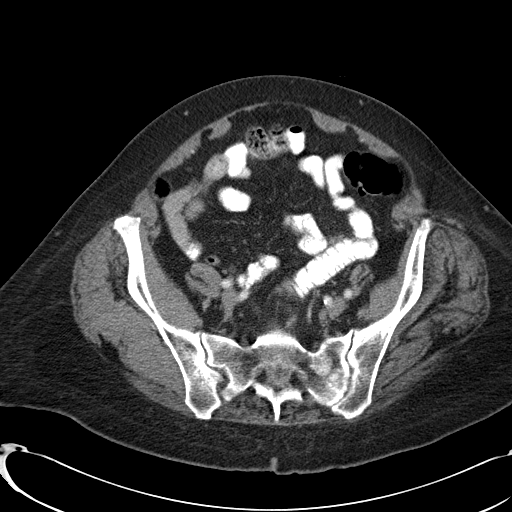
[im 37/125  soft-tissue]
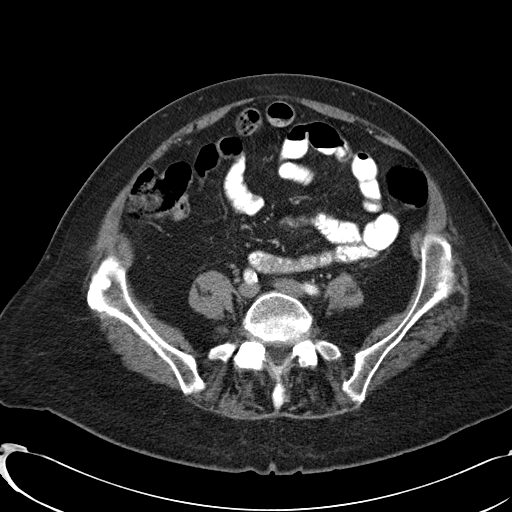
[im 52/125  soft-tissue]
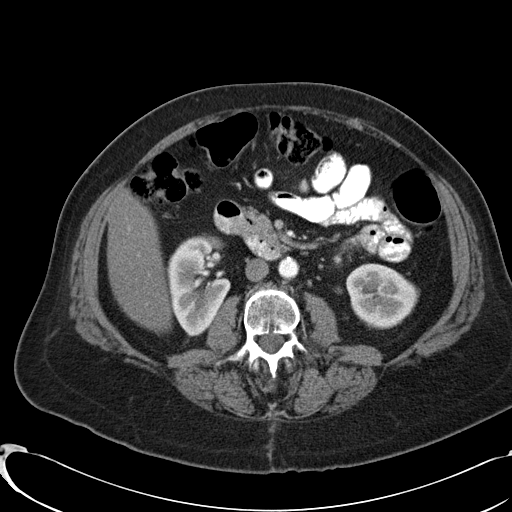
[im 59/125  soft-tissue]
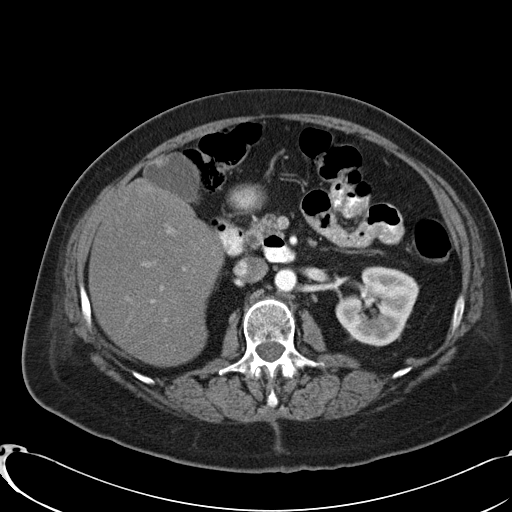
[im 66/125  soft-tissue]
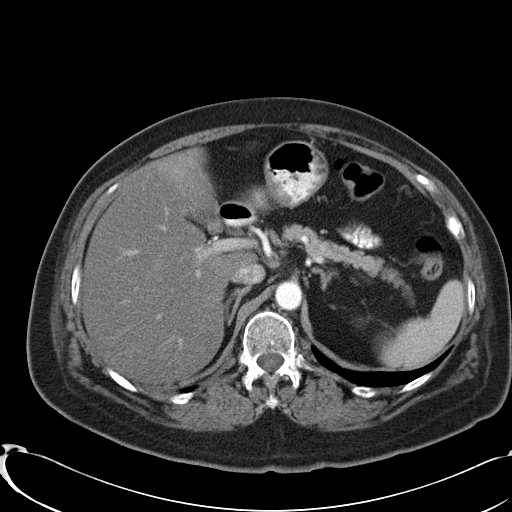
[im 81/125  soft-tissue]
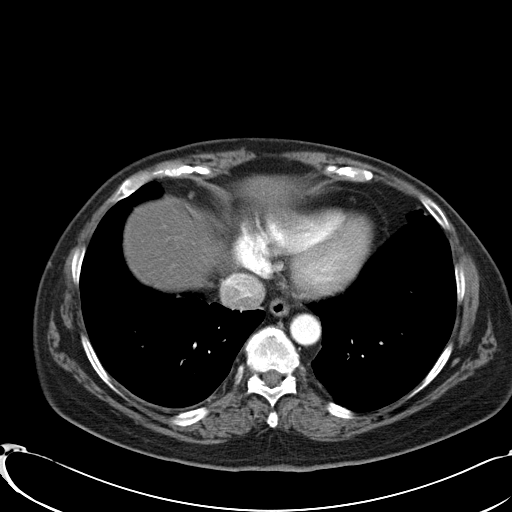
[im 88/125  soft-tissue]
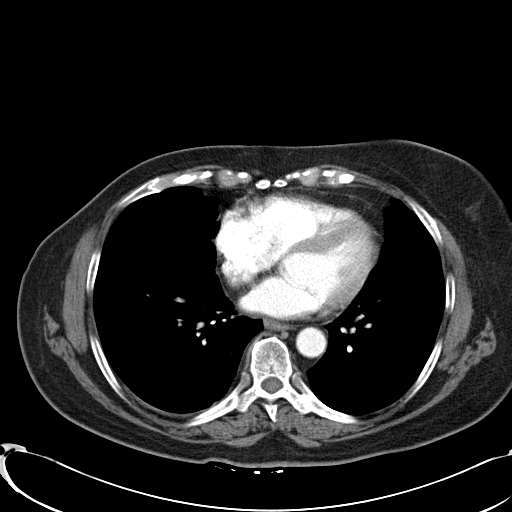
[im 88/125  bone]
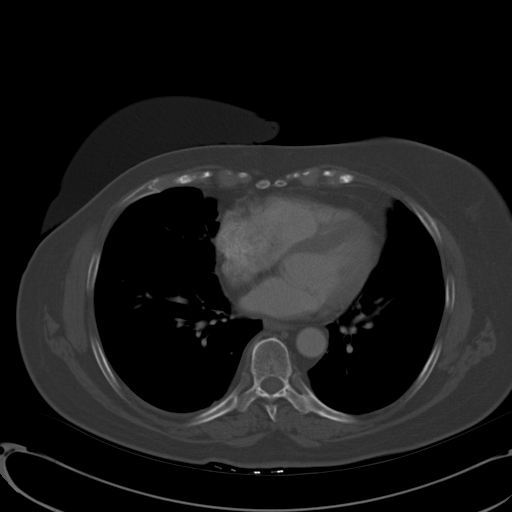
[im 95/125  soft-tissue]
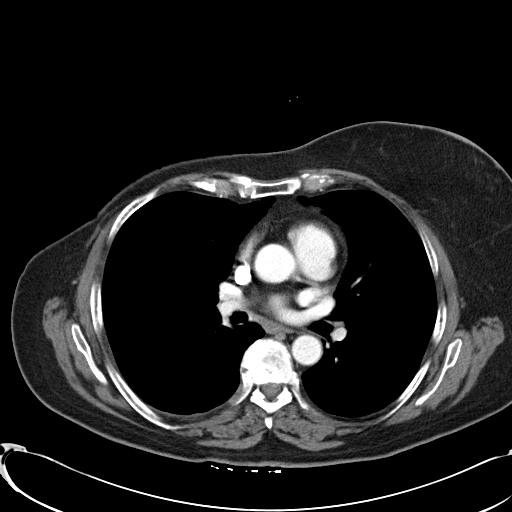
[im 110/125  soft-tissue]
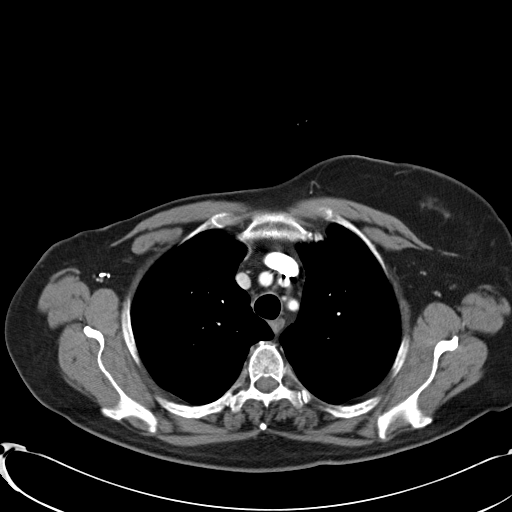
[im 117/125  soft-tissue]
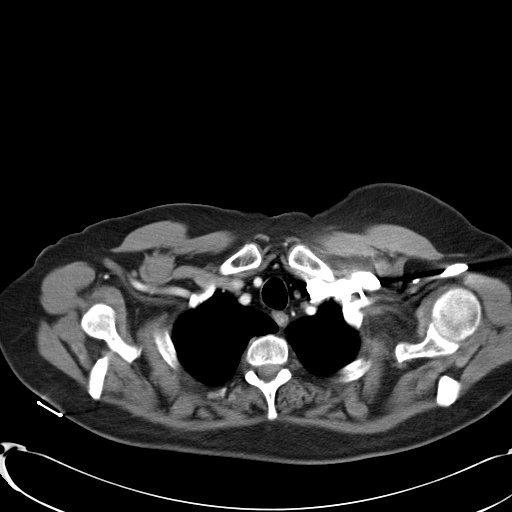

[Series 602: <mpr thick range> · coronal · 1.22mm/px · 3 of 95 slices shown]
[im 32/95  soft-tissue]
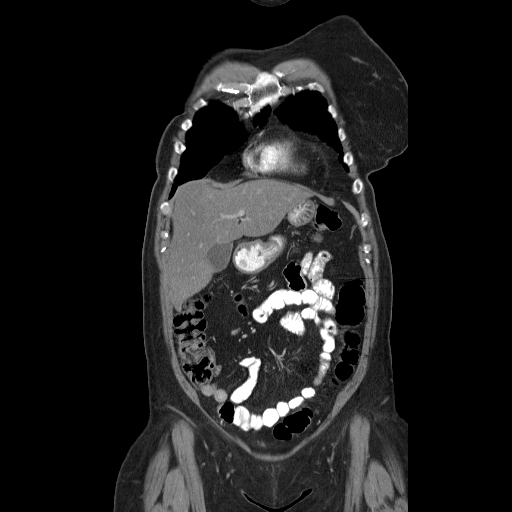
[im 42/95  soft-tissue]
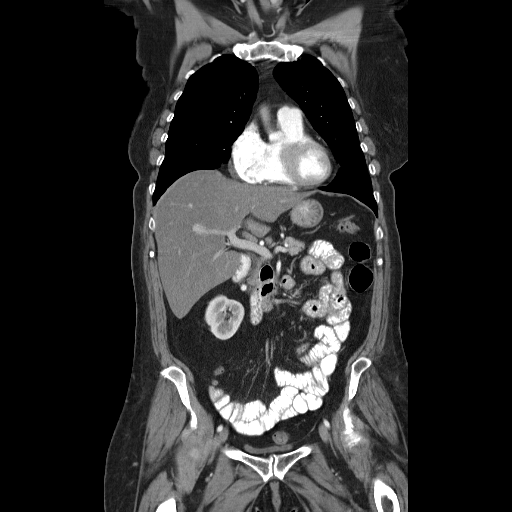
[im 53/95  soft-tissue]
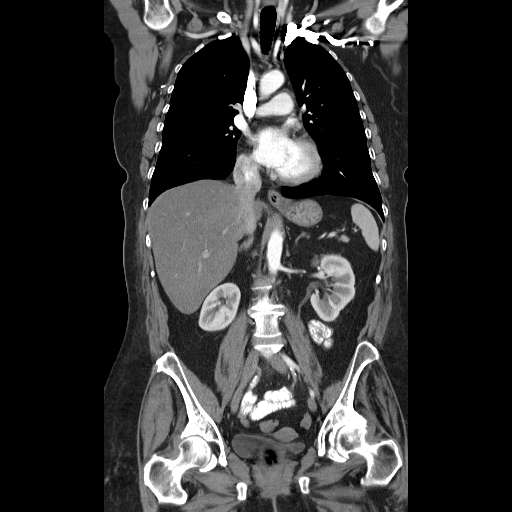

[15 of 46 positions shown; findings below may reference images not displayed]

FINDINGS: RECIST

Target Lesions:

1. Right lower lobe lung nodule:  0.6 cm, previously 0.6 cm
2. Subcarinal lymph node: 0.6 cm, previously 0.6 cm, with stable
coarse internal calcifications
3. Right hilar lymph node:  0.7 cm, previously 0.8 cm
Non-target Lesions:

1. Small right pleural effusion: Resolved (no current right pleural
effusion)
2. Superior segment right lower lobe lung nodule: Resolved (the
superior segment right lower lobe lung nodule described on the
baseline 10/03/2011 chest CT study is not apparent on the current
scan, as on the prior chest CT)

CT CHEST FINDINGS

Mediastinum/Nodes: Normal heart size, with no pericardial fluid or
thickening. Great vessels are normal in course and calibre. No
central pulmonary emboli. Normal visualized thyroid. Normal
esophagus. Right axillary surgical clips. No axillary
lymphadenopathy. No mediastinal or hilar lymphadenopathy by size
criteria. Coarse calcifications within the subcentimeter subcarinal
node, unchanged, likely from prior granulomatous disease.

Lungs/Pleura: No pleural effusion. No pneumothorax. Peripheral right
upper lobe 2 mm pulmonary nodule (series 4/image 22), not
appreciably changed back to 10/03/2011, probably benign. Stable
cm right lower lobe pulmonary nodule (4/41). Stable mild scarring in
the dependent right lower lobe. No new significant pulmonary
nodules. No new focal lung consolidation.

Musculoskeletal: Status post right mastectomy. Mild degenerative
changes in the thoracic spine, with no suspicious focal osseous
lesions in the chest.

CT ABDOMEN AND PELVIS FINDINGS

Hepatobiliary: There is mildly heterogeneous diffuse hepatic
steatosis. Re- demonstrated is an ill defined 1.3 cm hyperenhancing
focus in the right liver lobe (series 2/image 55), which is nearly
imperceptible on the delayed sequence, and is unchanged back to
10/03/2011, most in keeping with a benign transient perfusional
phenomenon. Otherwise normal liver, with no liver mass. Focal
thickening at the gallbladder fundus is unchanged since 02/18/2012,
and is in keeping with focal adenomyomatosis. Otherwise normal
gallbladder, with no radiopaque cholelithiasis. No biliary ductal
dilatation.

Pancreas: Normal.

Spleen: Normal.

Adrenals/Urinary Tract: Normal right adrenal. Left adrenal 1.0 cm
nodule (2/61) with indeterminate CT density, unchanged in size since
10/03/2011, probably benign. Normal kidneys with no hydronephrosis
and no renal mass. Minimally distended and grossly normal urinary
bladder.

Stomach/Bowel: Grossly normal stomach. Normal caliber small bowel
with no small bowel wall thickening. Normal appendix. Mild sigmoid
diverticulosis.

Vascular/Lymphatic: No abdominal or pelvic lymphadenopathy.
Atherosclerotic nonaneurysmal thoracic aorta.

Reproductive: Grossly normal uterus.  No adnexal abnormality.

Other: No pneumoperitoneum, ascites or focal fluid collection.

Musculoskeletal: Moderate degenerative changes in the lumbar spine.
Asymmetric prominent left hip osteoarthritis. An indistinct
sclerotic focus in the anterior left iliac bone (2/93) is unchanged
back to 04/16/2004 and likely benign. Non expansile sclerotic osseous
lesion in the left sacrum (2/96), which is unchanged in the interval
and is increased in density compared to 10/03/11, in keeping with a
treated metastasis. No new suspicious focal osseous lesions.
IMPRESSION: 1. Two stable subcentimeter right pulmonary nodules.
2. Stable sclerotic left sacral osseous metastasis.
3. Otherwise no evidence of metastatic disease in the chest, abdomen
or pelvis.
4. RECIST findings as above.

## 2016-10-31 ENCOUNTER — Telehealth: Payer: Self-pay | Admitting: Oncology

## 2016-10-31 ENCOUNTER — Ambulatory Visit (HOSPITAL_BASED_OUTPATIENT_CLINIC_OR_DEPARTMENT_OTHER): Payer: Medicare Other | Admitting: Oncology

## 2016-10-31 ENCOUNTER — Other Ambulatory Visit (HOSPITAL_COMMUNITY)
Admission: AD | Admit: 2016-10-31 | Discharge: 2016-10-31 | Disposition: A | Discharge status: Home / Self Care | Payer: Medicare Other | Source: Ambulatory Visit | Attending: Oncology | Admitting: Oncology

## 2016-10-31 ENCOUNTER — Encounter: Payer: Medicare Other | Admitting: *Deleted

## 2016-10-31 ENCOUNTER — Other Ambulatory Visit (HOSPITAL_BASED_OUTPATIENT_CLINIC_OR_DEPARTMENT_OTHER): Payer: Medicare Other

## 2016-10-31 VITALS — BP 107/59 | HR 101 | Temp 97.8°F | Ht 64.0 in | Wt 160.6 lb

## 2016-10-31 DIAGNOSIS — C50411 Malignant neoplasm of upper-outer quadrant of right female breast: Secondary | ICD-10-CM | POA: Insufficient documentation

## 2016-10-31 DIAGNOSIS — C801 Malignant (primary) neoplasm, unspecified: Secondary | ICD-10-CM

## 2016-10-31 DIAGNOSIS — Z006 Encounter for examination for normal comparison and control in clinical research program: Secondary | ICD-10-CM

## 2016-10-31 DIAGNOSIS — Z170 Estrogen receptor positive status [ER+]: Secondary | ICD-10-CM

## 2016-10-31 DIAGNOSIS — C773 Secondary and unspecified malignant neoplasm of axilla and upper limb lymph nodes: Secondary | ICD-10-CM | POA: Diagnosis not present

## 2016-10-31 DIAGNOSIS — I3131 Malignant pericardial effusion in diseases classified elsewhere: Secondary | ICD-10-CM

## 2016-10-31 DIAGNOSIS — I318 Other specified diseases of pericardium: Secondary | ICD-10-CM | POA: Diagnosis not present

## 2016-10-31 DIAGNOSIS — C50911 Malignant neoplasm of unspecified site of right female breast: Secondary | ICD-10-CM

## 2016-10-31 DIAGNOSIS — C7800 Secondary malignant neoplasm of unspecified lung: Secondary | ICD-10-CM

## 2016-10-31 DIAGNOSIS — I313 Pericardial effusion (noninflammatory): Secondary | ICD-10-CM

## 2016-10-31 DIAGNOSIS — Z79811 Long term (current) use of aromatase inhibitors: Secondary | ICD-10-CM | POA: Diagnosis not present

## 2016-10-31 LAB — COMPREHENSIVE METABOLIC PANEL
ALT: 67 U/L — AB (ref 0–55)
ANION GAP: 11 meq/L (ref 3–11)
AST: 83 U/L — ABNORMAL HIGH (ref 5–34)
Albumin: 3.3 g/dL — ABNORMAL LOW (ref 3.5–5.0)
Alkaline Phosphatase: 281 U/L — ABNORMAL HIGH (ref 40–150)
BILIRUBIN TOTAL: 0.29 mg/dL (ref 0.20–1.20)
BUN: 23.3 mg/dL (ref 7.0–26.0)
CO2: 16 mEq/L — ABNORMAL LOW (ref 22–29)
CREATININE: 1 mg/dL (ref 0.6–1.1)
Calcium: 10.3 mg/dL (ref 8.4–10.4)
Chloride: 109 mEq/L (ref 98–109)
EGFR: 56 mL/min/{1.73_m2} — ABNORMAL LOW (ref >90)
GLUCOSE: 254 mg/dL — AB (ref 70–140)
Potassium: 4.2 mEq/L (ref 3.5–5.1)
SODIUM: 136 meq/L (ref 136–145)
Total Protein: 8 g/dL (ref 6.4–8.3)

## 2016-10-31 LAB — CBC WITH DIFFERENTIAL/PLATELET
BASO%: 0.2 % (ref 0.0–2.0)
Basophils Absolute: 0 10*3/uL (ref 0.0–0.1)
EOS%: 1.1 % (ref 0.0–7.0)
Eosinophils Absolute: 0.1 10*3/uL (ref 0.0–0.5)
HCT: 41 % (ref 34.8–46.6)
HGB: 13.6 g/dL (ref 11.6–15.9)
LYMPH%: 10.9 % — AB (ref 14.0–49.7)
MCH: 26 pg (ref 25.1–34.0)
MCHC: 33.2 g/dL (ref 31.5–36.0)
MCV: 78.4 fL — AB (ref 79.5–101.0)
MONO#: 0.7 10*3/uL (ref 0.1–0.9)
MONO%: 7.7 % (ref 0.0–14.0)
NEUT#: 7 10*3/uL — ABNORMAL HIGH (ref 1.5–6.5)
NEUT%: 80.1 % — AB (ref 38.4–76.8)
PLATELETS: 288 10*3/uL (ref 145–400)
RBC: 5.23 10*6/uL (ref 3.70–5.45)
RDW: 14.7 % — ABNORMAL HIGH (ref 11.2–14.5)
WBC: 8.8 10*3/uL (ref 3.9–10.3)
lymph#: 1 10*3/uL (ref 0.9–3.3)

## 2016-10-31 IMAGING — CT CT ABD-PELV W/ CM
2 of 5 series · 14 of 46 positions shown, 16 images · IV contrast (OMNIPAQUE)
Comparison: 02/21/2015 CT of the chest, abdomen and pelvis.

CLINICAL DATA: 64-year-old female with right breast cancer
diagnosed in 7669 metastatic to the lungs diagnosed in 1743 on
investigational drug chemotherapy trial. Restaging.

EXAM:
CT CHEST, ABDOMEN, AND PELVIS WITH CONTRAST
TECHNIQUE: Multidetector CT imaging of the chest, abdomen and pelvis was
performed following the standard protocol during bolus
administration of intravenous contrast.
CONTRAST:  50mL OMNIPAQUE IOHEXOL 300 MG/ML SOLN, 100mL OMNIPAQUE
IOHEXOL 300 MG/ML SOLN

[Series 2: cap with st · axial · 0.73mm/px · z∈[-569,-44]mm · 11 of 121 slices shown, 13 images]
[im 8/121  soft-tissue]
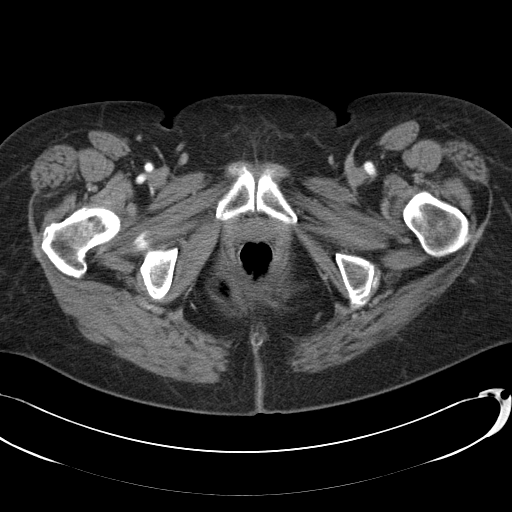
[im 8/121  bone]
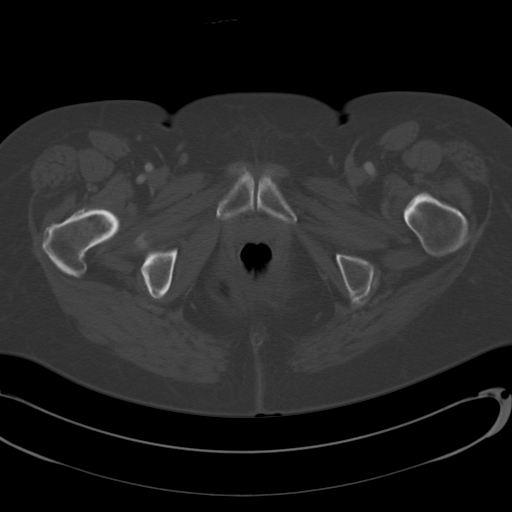
[im 22/121  soft-tissue]
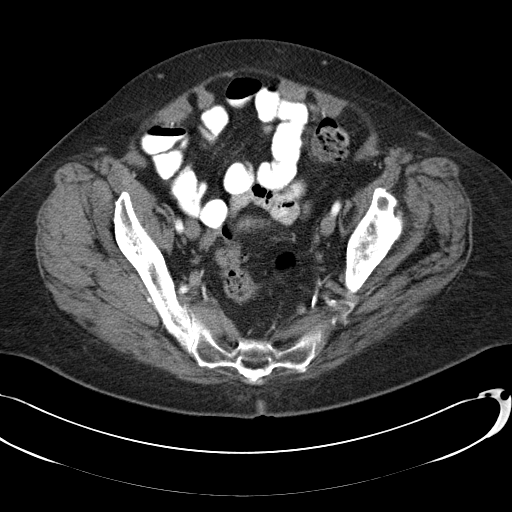
[im 29/121  soft-tissue]
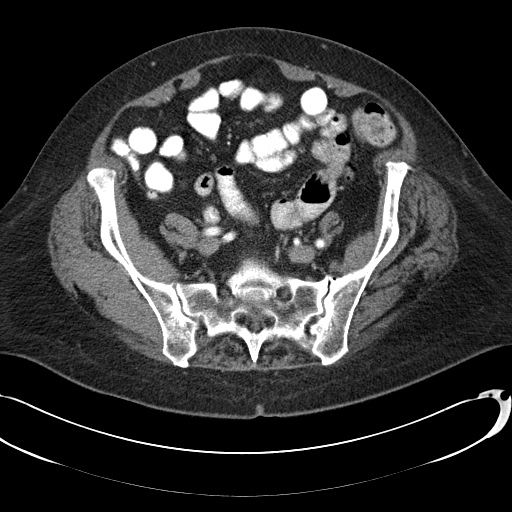
[im 43/121  soft-tissue]
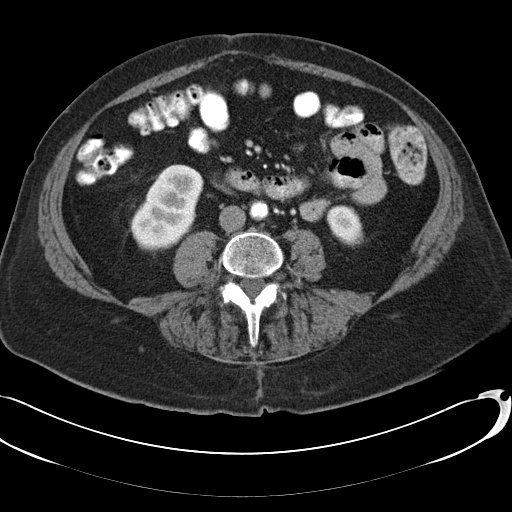
[im 50/121  soft-tissue]
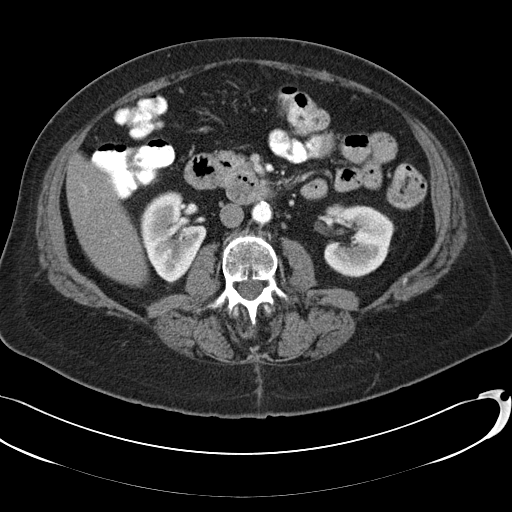
[im 64/121  soft-tissue]
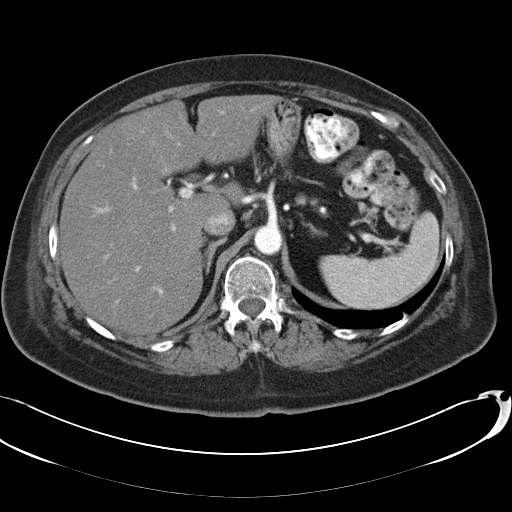
[im 71/121  soft-tissue]
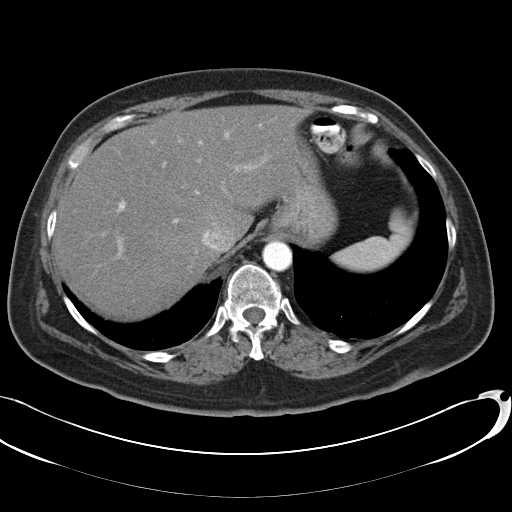
[im 78/121  soft-tissue]
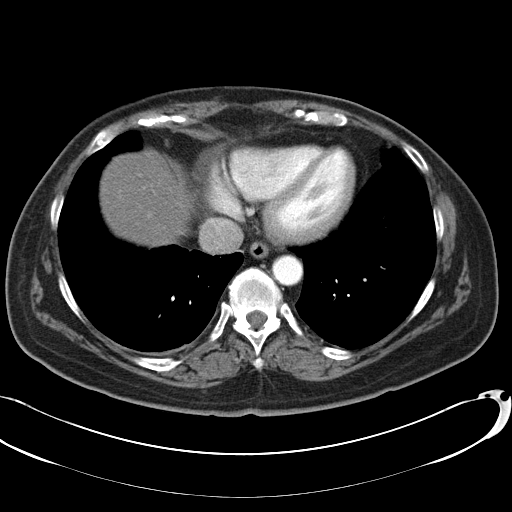
[im 92/121  soft-tissue]
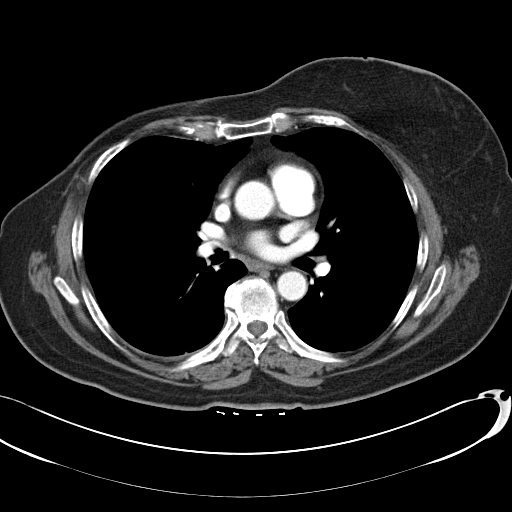
[im 92/121  bone]
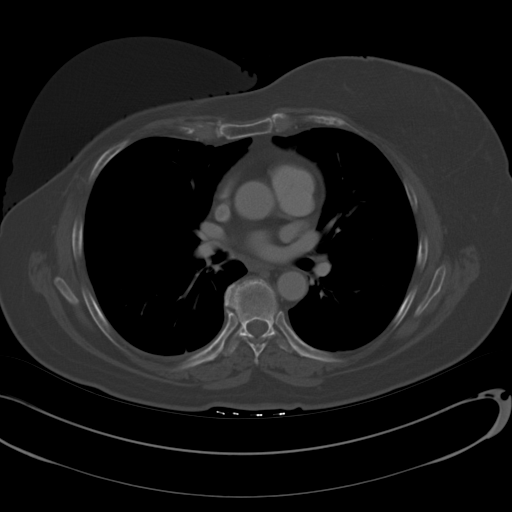
[im 99/121  soft-tissue]
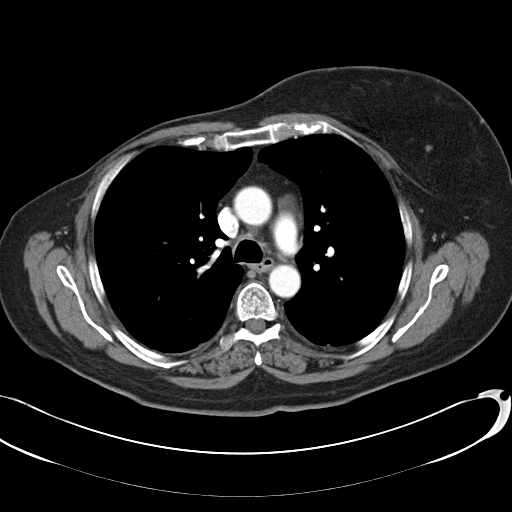
[im 113/121  soft-tissue]
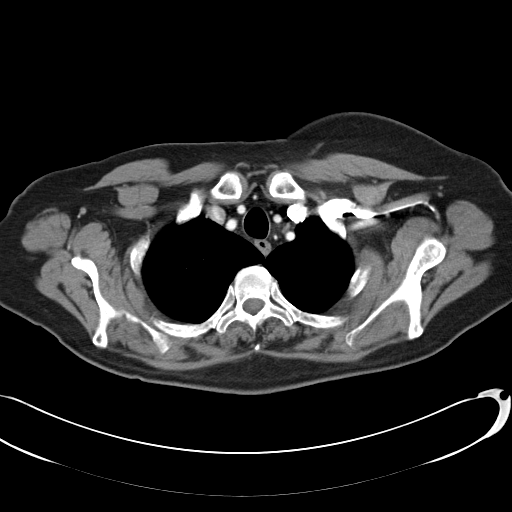

[Series 602: <mpr thick range> · coronal · 1.18mm/px · 3 of 93 slices shown]
[im 31/93  soft-tissue]
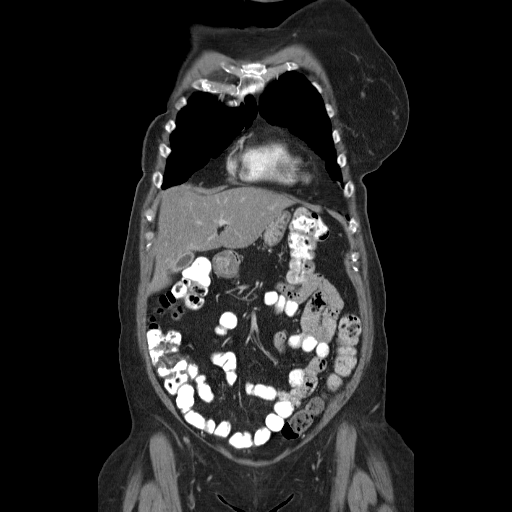
[im 41/93  soft-tissue]
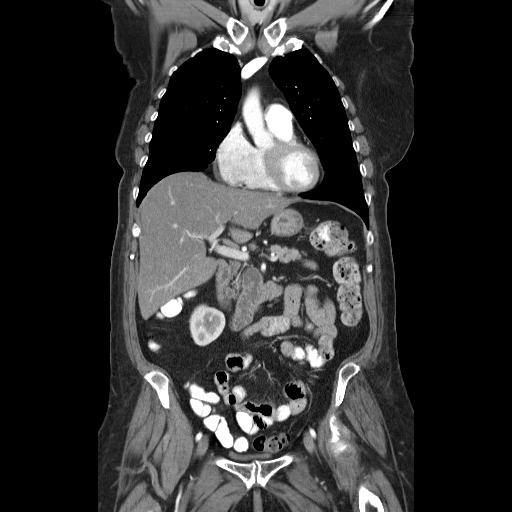
[im 52/93  soft-tissue]
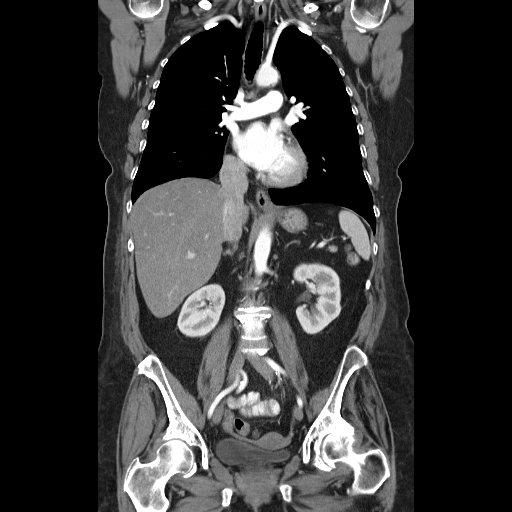

[14 of 46 positions shown; findings below may reference images not displayed]

FINDINGS: RECIST

Target Lesions:

1. Right lower lobe lung nodule (series 5/image 38): 0.6 cm,
previously 0.6 cm
2. Subcarinal lymph node: 0.6 cm, previously 0.6 cm with stable
coarse internal calcifications
3. Right hilar lymph node:  0.8 cm, previously 0.7 cm
Non-target Lesions:

1. Small right pleural effusion: New trace layering right pleural
effusion, previously resolved
2. Superior segment right lower lobe lung nodule: Resolved (the
superior segment right lower lobe lung nodule described on the
baseline 10/03/2011 chest CT study is not apparent on the current
scan, as on the prior chest CT)

CT CHEST FINDINGS

Mediastinum/Nodes: Normal heart size. No pericardial
fluid/thickening. Atherosclerotic nonaneurysmal thoracic aorta. No
central pulmonary emboli. Subcentimeter hypodense nodule in the
right thyroid lobe, likely unchanged, better visualized on today's
scan. Normal esophagus. Right axillary surgical clips are again
noted. No pathologically enlarged axillary, mediastinal or hilar
lymph nodes. Stable coarsely calcified 0.6 cm subcarinal node. Right
hilar 0.8 cm top normal size node ([DATE]), previously 0.7 cm, not
appreciably changed.

Lungs/Pleura: New trace layering right pleural effusion. No left
pleural effusion. No pneumothorax. There is a new 3 mm right upper
lobe solid pulmonary nodule (series 5/ image 21). There is a
peripheral right upper lobe 2 mm pulmonary nodule ([DATE]), stable
since 10/03/2011 and probably benign. There is a 0.6 cm peripheral
right lower lobe pulmonary nodule ([DATE]), unchanged, with associated
stable surrounding bandlike peripheral reticulation in the right
lower lobe. No additional new significant pulmonary nodules or acute
consolidative airspace disease. Subsegmental atelectasis in the
basilar left lower lobe.

Musculoskeletal: Mild degenerative changes in the thoracic spine. No
aggressive appearing focal osseous lesions. Status post right
mastectomy.

CT ABDOMEN AND PELVIS FINDINGS

Hepatobiliary: Diffuse hepatic steatosis. Stable 1.3 cm
hyperenhancing right liver lobe focus (series 2/ image 52), which is
occult on the delayed sequence and stable since 10/03/2011, most in
keeping with a benign transient perfusional phenomena. No new liver
lesions. Stable focal fundal adenomyomatosis in the otherwise normal
gallbladder, with no radiopaque cholelithiasis. No biliary ductal
dilatation.

Pancreas: Normal, with no mass or duct dilation.

Spleen: Normal size spleen with no splenic mass.

Adrenals/Urinary Tract: Normal right adrenal. Left adrenal 1.0 cm
nodule (2/61), unchanged since 10/03/2011, in keeping with a benign
adenoma. Normal kidneys, with no hydronephrosis. Normal caliber
ureters. Normal urinary bladder.

Stomach/Bowel: Collapsed and grossly normal stomach. Normal caliber
small bowel, with no small bowel wall thickening. Normal appendix.
Normal large bowel.

Vascular/Lymphatic: Atherosclerotic nonaneurysmal abdominal aorta.
Patent portal, splenic and renal veins. No abdominopelvic
lymphadenopathy.

Reproductive: Normal anteverted uterus.  No adnexal mass.

Other: No pneumoperitoneum, ascites or focal fluid collection.

Musculoskeletal: Asymmetric prominent osteoarthritis in the left hip
joint, unchanged. Moderate degenerative changes in the lumbar spine.
Stable small sclerotic lesion in the left sacrum (2/94). Stable
small sclerotic anterior left iliac bone focus (2/91), stable since
7669 and likely benign. No new aggressive appearing focal osseous
lesions.
IMPRESSION: 1. New indeterminate 3 mm right upper lobe pulmonary nodule,
metastasis not excluded. Two additional subcentimeter right
pulmonary nodules are stable. Recommend attention on follow-up chest
CT in 3 months.
2. New trace layering right pleural effusion.
3. Stable sclerotic left sacral osseous metastasis.
4. Otherwise no potential sites of metastatic disease in the chest,
abdomen or pelvis.
5. Diffuse hepatic steatosis.
6. RECIST findings as above.

## 2016-10-31 MED ORDER — INV-EVEROLIMUS (RAD0001) 5MG TABLET NOVARTIS CRAD001Y24135: 2.0000 | ORAL_TABLET | Freq: Every day | ORAL | 0 refills | Status: DC

## 2016-10-31 NOTE — Telephone Encounter (Signed)
Gave patient avs report and appointments for June. Central radiology will call re scan.  °

## 2016-10-31 NOTE — Progress Notes (Signed)
[place]  [phone] [phone]     ID: Barbara Parks DOB: [birth year]  MR#: [id number]  JSR#:[id number]  Patient Care Team: Darcus Austin, MD as PCP - General (Family Medicine) Jerline Pain, MD (Cardiology) Gaye Pollack, MD (Cardiothoracic Surgery) Chauncey Cruel, MD as Consulting Physician (Oncology) OTHER MD: Dr Erroll Luna- Merlene Laughter, DDS; Gaynelle Arabian MD  CHIEF COMPLAINT: metastatic breast cancer, on BOLERO study  CURRENT TREATMENT: letrozole + everolimus  BREAST CANCER HISTORY: From Dr. Collier [name]'s original intake note 04/13/2004:  "This woman has been in good health all of her life. She recently moved from [place] to work here.  She palpated a mass at about the 12 o'clock position in mid-July. She has not noticed any nipple retraction or skin changes.  She was seen by her primary care doctor who subsequently referred her for a mammogram.  Mammogram was performed on 03/01/04. This demonstrated a spiculated 2 cm mass at the 12 o'clock position in the right breast.  Upper outer quadrant of the left breast shows some distortion.   Physical exam at that time showed a firm, nontender nodule at the 12 o'clock position in the right breast, 5 cm from the nipple.  Ultrasound of this area showed a hypoechoic ill-defined mass, measuring 2.2 x 1.3 x 1.4 cm.  Physical exam of the left breast showed general vague thickening, upper outer quadrant of the left breast with a discrete palpable mass. The ultrasound performed showed a single hypoechoic ill-defined nodule at the 1 o'clock position, measuring 7 x 5 x 6 mm.  She had biopsies of both lesions on 03/02/03.  Needle core biopsy of the lesion on the right breast revealed invasive mammary carcinoma.  Needle core biopsy of the left breast showed a complex fibroadenoma.  Prognostic panel of the lesion on the right breast showed it to be ER positive at 73%, PR positive at 90% and proliferative index 9%, HER-2  was 1+.  Patient was referred to Dr. Annamaria Boots, who performed a simple mastectomy with sentinel lymph node evaluation on 03/22/04.  Final pathology showed this to be an invasive ductal carcinoma  with lobular features, measuring 2.2 cm, grade 2 of 3.  Margins negative for carcinoma.  Invasive ductal carcinoma was extended to involve deep dermis of the nipple.  Lymphovascular invasion was identified.  Total of 4 sentinel lymph nodes were evaluated.  Touch imprints at the time of the OR was felt to be negative.  Subsequent evaluation showed a 5 mm focus of metastatic carcinoma in one of the four lymph nodes on microscopic after sectioning.  There was extracapsular extension  of one lymph node as well. The remaining three lymph nodes were all negative."  The patient's subsequent history is detailed below.  INTERVAL HISTORY: Barbara Parks returns today for follow-up of her estrogen receptor positive metastatic breast cancer. She continues under treatment through the [facility] for trial, receiving letrozole and everolimus. This is the extension phase visit #5. She tolerates the letrozole remarkably well.Hot flashes and vaginal dryness are not a major issue. She never developed the arthralgias or myalgias that many patients can experience on this medication. She obtains it at a good price.  She also does well with the everolimus. She does not have significant problems with rash, fatigue, diarrhea, mouth sores, mucositis, or cough.  REVIEW OF SYSTEMS: Overall she is doing "pretty well". Since having her hip done she picked up golf and her best 18 whole score was 82 last year.  When she doesn't exercise she feels tired. Now that she started exercising again she has more energy. In particular she enjoys yard work, walking, and has some exercise equipment at home, quite aside from the golf. I am not picking up or eliciting any adverse events from her treatment. A detailed review of systems today was otherwise stable.   PAST  MEDICAL HISTORY: Past Medical History:  Diagnosis Date  . Arthritis    left hip  . Asthma   . breast ca 2005   breast/chemo R mastectomy  . GERD (gastroesophageal reflux disease)   . Hypercholesteremia   . Metastasis to lung ([place]) dx'd 08/2011  . Osteopenia due to cancer therapy 09/09/2013  . Palpitations   . Shortness of breath     PAST SURGICAL HISTORY: Past Surgical History:  Procedure Laterality Date  . BREAST SURGERY  2005   right  . CATARACT EXTRACTION W/PHACO Left 01/18/2014   Procedure: CATARACT EXTRACTION PHACO AND INTRAOCULAR LENS PLACEMENT (IOC);  Surgeon: Elta Guadeloupe T. Gershon Crane, MD;  Location: [facility];  Service: Ophthalmology;  Laterality: Left;  CDE 15.79  . CATARACT EXTRACTION W/PHACO Right 02/08/2014   Procedure: CATARACT EXTRACTION PHACO AND INTRAOCULAR LENS PLACEMENT (IOC);  Surgeon: Elta Guadeloupe T. Gershon Crane, MD;  Location: [facility];  Service: Ophthalmology;  Laterality: Right;  CDE 4.41  . CHEST TUBE INSERTION  09/11/2011   Procedure: INSERTION PLEURAL DRAINAGE CATHETER;  Surgeon: Gaye Pollack, MD;  Location: [facility] [place];  Service: Thoracic;  Laterality: Right;  . PERICARDIAL WINDOW  09/11/2011   Procedure: PERICARDIAL WINDOW;  Surgeon: Gaye Pollack, MD;  Location: [facility] OR;  Service: Thoracic;  Laterality: N/A;  . REMOVAL OF PLEURAL DRAINAGE CATHETER  12/19/2011   Procedure: REMOVAL OF PLEURAL DRAINAGE CATHETER;  Surgeon: Gaye Pollack, MD;  Location: [place];  Service: Thoracic;  Laterality: Right;  TO BE DONE IN MINOR ROOM, SHORT STAY  . [place]  . TOTAL HIP ARTHROPLASTY Left 06/07/2015   Procedure: LEFT TOTAL HIP ARTHROPLASTY ANTERIOR APPROACH;  Surgeon: Gaynelle Arabian, MD;  Location: [facility];  Service: Orthopedics;  Laterality: Left;  Marland Kitchen VIDEO BRONCHOSCOPY  09/11/2011   Procedure: VIDEO BRONCHOSCOPY;  Surgeon: Gaye Pollack, MD;  Location[facility];  Service: Thoracic;  Laterality: N/A;    FAMILY HISTORY Family History  Problem Relation Age of Onset  . Cancer Mother     breast    . Heart disease Father   . Anesthesia problems Neg Hx    the patient's mother was diagnosed with breast cancer at age 20, she died age 85 from congestive heart failure..The patient's father died from heart disease at age 43.  She has one sister alive & well.  Two brothers alive & well, one with diabetes. The patient's sister was also diagnosed with breast cancer, and was tested for the BRCA gene, and was negative. The patient herself has not been tested. There is no history of ovarian cancer in the family  GYNECOLOGIC HISTORY:  No LMP recorded. Patient is postmenopausal. Menarche age 59, the patient is GX P0. She stopped having periods with chemotherapy in 2005. 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 She was a Archivist. She is single, lives alone with her Shitzu-poodle Sammie.. Family is all in the [place] area.     ADVANCED DIRECTIVES: Not in place. At the 02/23/2014 visit the patient was given  the appropriate documents to complete and notarize at her discretion. She tells me she is planning to name her sister, Billie Ruddy, as healthcare power of attorney. [name] can be reached at [phone]   HEALTH MAINTENANCE: Social History  Substance Use Topics  . Smoking status: Former Smoker    Packs/day: 1.50    Years: 30.00    Types: Cigarettes    Quit date: 09/10/2007  . Smokeless tobacco: Not on file  . Alcohol use No     Colonoscopy:  PAP:  Bone density: 08/14/2015, T score -1.6  Lipid panel:  Allergies  Allergen Reactions  . Aspirin     REACTION: upset stomach  . Latex Other (See Comments)    Blistering and skin peels off  . Nsaids Nausea And Vomiting    Extreme nausea and vomiting    Current Outpatient Prescriptions  Medication Sig Dispense Refill  . cholecalciferol (VITAMIN D) 1000 units tablet Take 1,000 Units by mouth 2 (two) times daily.    Marland Kitchen  gemfibrozil (LOPID) 600 MG tablet Take 1 tablet (600 mg total) by mouth 2 (two) times daily before a meal. 180 tablet 12  . Investigational everolimus (RAD001) 5 MG tablet Novartis [id number] Take 2 tablets by mouth daily. Take with a glass of water. 168 tablet 0  . letrozole (FEMARA) 2.5 MG tablet Take 1 tablet (2.5 mg total) by mouth daily. 90 tablet 3  . loperamide (IMODIUM) 2 MG capsule Take 2 mg by mouth 4 (four) times daily as needed for diarrhea or loose stools. Reported on 11/30/2015    . metoprolol tartrate (LOPRESSOR) 25 MG tablet Take 0.5 tablets (12.5 mg total) by mouth daily. 30 tablet 4  . omeprazole (PRILOSEC) 40 MG capsule Take 1 capsule (40 mg total) by mouth daily as needed. 90 capsule 3  . ondansetron (ZOFRAN) 8 MG tablet Take 1 tablet (8 mg total) by mouth 2 (two) times daily as needed (Nausea or vomiting). (Patient not taking: Reported on 09/20/2015) 30 tablet 1  . prochlorperazine (COMPAZINE) 25 MG suppository Place 1 suppository (25 mg total) rectally every 12 (twelve) hours as needed for nausea. (Patient not taking: Reported on 09/20/2015) 12 suppository 3   No current facility-administered medications for this visit.     OBJECTIVE: Middle-aged white woman Who appears well Vitals:   10/31/16 1518 10/31/16 1519  BP: (!) 117/58 (!) 107/59  Pulse:    Temp:       Body mass index is 27.57 kg/m.    ECOG FS:0 - Asymptomatic  Sclerae unicteric, pupils round and equal Oropharynx clear and moist No cervical or supraclavicular adenopathy Lungs no rales or rhonchi Heart regular rate and rhythm Abd soft, nontender, positive bowel sounds MSK no focal spinal tenderness, no upper extremity lymphedema Neuro: nonfocal, well oriented, appropriate affect Breasts: The right breast is status post mastectomy with no evidence of chest wall recurrence. The left breast is unremarkable. Both axillae are benign.  LAB  RESULTS:  CMP     Component Value Date/Time   NA 136 10/31/2016 1456    K 4.2 10/31/2016 1456   CL 106 09/20/2015 1257   CL 109 (H) 12/31/2012 0940   CO2 16 (L) 10/31/2016 1456   GLUCOSE 254 (H) 10/31/2016 1456   GLUCOSE 127 (H) 12/31/2012 0940   BUN 23.3 10/31/2016 1456   CREATININE 1.0 10/31/2016 1456   CALCIUM 10.3 10/31/2016 1456   PROT 8.0 10/31/2016 1456   ALBUMIN 3.3 (L) 10/31/2016 1456   AST 83 (H) 10/31/2016  1456   ALT 67 (H) 10/31/2016 1456   ALKPHOS 281 (H) 10/31/2016 1456   BILITOT 0.29 10/31/2016 1456   GFRNONAA >60 09/20/2015 1257   GFRAA >60 09/20/2015 1257    I No results found for: SPEP  Lab Results  Component Value Date   WBC 8.8 10/31/2016   NEUTROABS 7.0 (H) 10/31/2016   HGB 13.6 10/31/2016   HCT 41.0 10/31/2016   MCV 78.4 (L) 10/31/2016   PLT 288 10/31/2016      Chemistry      Component Value Date/Time   NA 136 10/31/2016 1456   K 4.2 10/31/2016 1456   CL 106 09/20/2015 1257   CL 109 (H) 12/31/2012 0940   CO2 16 (L) 10/31/2016 1456   BUN 23.3 10/31/2016 1456   CREATININE 1.0 10/31/2016 1456      Component Value Date/Time   CALCIUM 10.3 10/31/2016 1456   ALKPHOS 281 (H) 10/31/2016 1456   AST 83 (H) 10/31/2016 1456   ALT 67 (H) 10/31/2016 1456   BILITOT 0.29 10/31/2016 1456       Lab Results  Component Value Date   LABCA2 58 (H) 09/14/2012    No components found for: [id number]  No results for input(s): INR in the last 168 hours.  Urinalysis    Component Value Date/Time   COLORURINE YELLOW 09/20/2015 1432   APPEARANCEUR HAZY (A) 09/20/2015 1432   LABSPEC >1.030 (H) 09/20/2015 1432   LABSPEC 1.030 07/13/2015 0841   PHURINE 5.5 09/20/2015 1432   GLUCOSEU 500 (A) 09/20/2015 1432   GLUCOSEU Negative 07/13/2015 0841   HGBUR MODERATE (A) 09/20/2015 1432   BILIRUBINUR NEGATIVE 09/20/2015 1432   BILIRUBINUR Negative 07/13/2015 0841   KETONESUR NEGATIVE 09/20/2015 1432   PROTEINUR 30 (A) 09/20/2015 1432   UROBILINOGEN 0.2 07/13/2015 0841   NITRITE NEGATIVE 09/20/2015 1432   LEUKOCYTESUR NEGATIVE  09/20/2015 1432   LEUKOCYTESUR Negative 07/13/2015 0841    STUDIES: Left mammogram due in August  ASSESSMENT: 66 y.o. Barbara Parks, Barbara Parks woman with stage IV breast cancer, on BOLERO-4 trial  (1) status post right mastectomy and sentinel lymph node sampling 03/22/2004 for a right upper-outer quadrant pT2 pN1, stage IIB invasive ductal carcinoma with lobular features, grade 2, estrogen receptor and progesterone receptor positive, HER-2 negative, with an MIB-1 of 9% ([id number] and [id number])  (2) addition all right axillary lymph node sampling 05/07/2004 showed 2 benign lymph nodes (4 lymph nodes previously removed, so total was one positive lymph node out of 6; [id number])  (3) the patient was evaluated by radiation oncology; no postmastectomy radiation recommended  (4) adjuvant chemotherapy with dose dense doxorubicin and cyclophosphamide x4 cycles (first cycle delayed one week) followed by dose dense paclitaxel x4 was completed 09/18/2004  (5) tamoxifen started March 2006, discontinued 2009  METASTATIC DISEASE: February 2013 (6) presenting with a large pericardial effusion, large right pleural effusion and possible right middle lobe bronchial obstruction February 2013, status post pericardial window placement, fiberoptic bronchoscopy and right Pleurx placement 09/11/2011, with biopsy of the bronchus intermedius and pericardium positive for metastatic breast cancer, estrogen receptor 91% positive with moderate staining intensity, progesterone receptor 100% positive with strong staining intensity, with an MIB-1 of 35%, and no HER-2 amplification, the signals ratio being 1.37 ([id number] [id number])  (7) enrolled in BOLERO-4 trial 10/10/2011, receiving letrozole and everolimus  (a) two small areas of enhancement in the cerebellum noted by brain MRI 10/03/2011 were no longer apparent on repeat MRI 08/21/2012-- most recent brain MRI 04/01/2014 showed no evidence of intracranial  metastatic disease  (b) sclerotic lesions  in left iliac bone and sacrum have not been biopsied; stable; plan is to start zolendronate after patient updates her dental care (extraction planned)  (c) RLL lung nodule, stable (rescanned 07/12/2014 and 08/11/2014) R hilar and subcarinal lymph nodes: stable  (9) additional problems:  (a) hepatic steatosis  (b) COPD/ emphysema/ asthma  (c) advanced L hip osteoarthritis, status post left total hip replacement 06/07/2015  (d) aortoiliac atherosclerosis  (e) dental evaluation pending w possible dental extractions  (f) possible thalassemia trait  (10) Bone density concerns: DEXA scan 08/11/2013 was normal  (11) likely thalassemia trait  PLAN: Monalisa is now 5 years out from definitive diagnosis of metastatic breast cancer with no evidence of clinical disease activity. This is very favorable.  She is tolerating her anti-estrogen and the everolimus without any significant side effects. We have not had any dose modifications for several years.  Accordingly we are continuing as before. She will see me again at the end of June. She will have a CT scan of the chest shortly before that visit.  She knows to call for any other problems develop before then. Chauncey Cruel, MD   10/31/2016 4:41 PM you to take care

## 2016-10-31 NOTE — Progress Notes (Signed)
10/31/2016 Patient in to clinic unaccompanied today for routine 12-week follow-up visit and study drug dispensing. Patient returned 6 kits of everolimus with no doses remaining, showing 100% compliance for the time period 08/08/2016 through 10/30/2016. Patient returned paper calendar documenting daily doses of letrozole and everolimus with no doses missed. Medication was returned to pharmacy for drug accountability by pharmacist Raul Del. Patient reports the following AE's as ongoing: fatigue, hot flashes (mild), memory impairment, dry skin, and back pain unchanged in severity. Patient reports that she has noticed blurry vision in her left eye, similar to what she experienced prior to her cataract surgery. This does not interfere with her ADLs. She plans to follow-up with Dr. Gershon Crane, or with her ophthalmologist for an eye exam. Based on lab results review and history and physical by Dr. Jana Hakim, patient condition is acceptable for continued treatment, and in his opinion she is still receiving benefit from her study treatment.   Six kits of study drug everolimus were dispensed today for the next 12 weeks supply. In addition, patient will continue commercial supply of letrozole 2.5mg  daily. The next study visit is expected to occur on 01/23/2017, with CT scans to be performed in advance, per MD discretion. Patient is in agreement with this plan. Thanked patient for her participation in this trial. Cindy S. Brigitte Pulse BSN, RN, [place] 10/31/2016 4:49 PM  Adverse Event Log Protocol: Novartis [id number] Cycle: Extension Phase #4 - 08/08/16 - 10/31/16 Ongoing and unchanged in severity: fatigue, hot flashes (mild), memory impairment, dry skin, and back pain  Event Grade Onset Date Resolved Date Attribution to letrozole Attribution to everolimus  Blurred vision, left eye 1 06/28/2016 Ongoing Not suspected Not suspected  Alkaline phosphatase increased 1 08/14/2012 Ongoing Not suspected Suspected  AST increased 1 11/30/2015  Ongoing Not suspected Suspected  ALT increased 1 10/31/2016 Ongoing Not suspected Suspected  Albumin decreased 1 05/16/2016 Ongoing Not suspected Suspected  Cindy S. Brigitte Pulse BSN, RN, CCRP 11/13/2016 11:01 AM

## 2016-11-01 LAB — CANCER ANTIGEN 27.29: CAN 27.29: 57.1 U/mL — AB (ref 0.0–38.6)

## 2016-11-03 ENCOUNTER — Other Ambulatory Visit: Payer: Self-pay | Admitting: Oncology

## 2016-12-19 IMAGING — CR DG PORTABLE PELVIS
1 series · 1 of 1 positions shown · non-contrast
Comparison: None.

CLINICAL DATA: Status post left hip replacement

EXAM:
PORTABLE PELVIS 1-2 VIEWS

[AP]
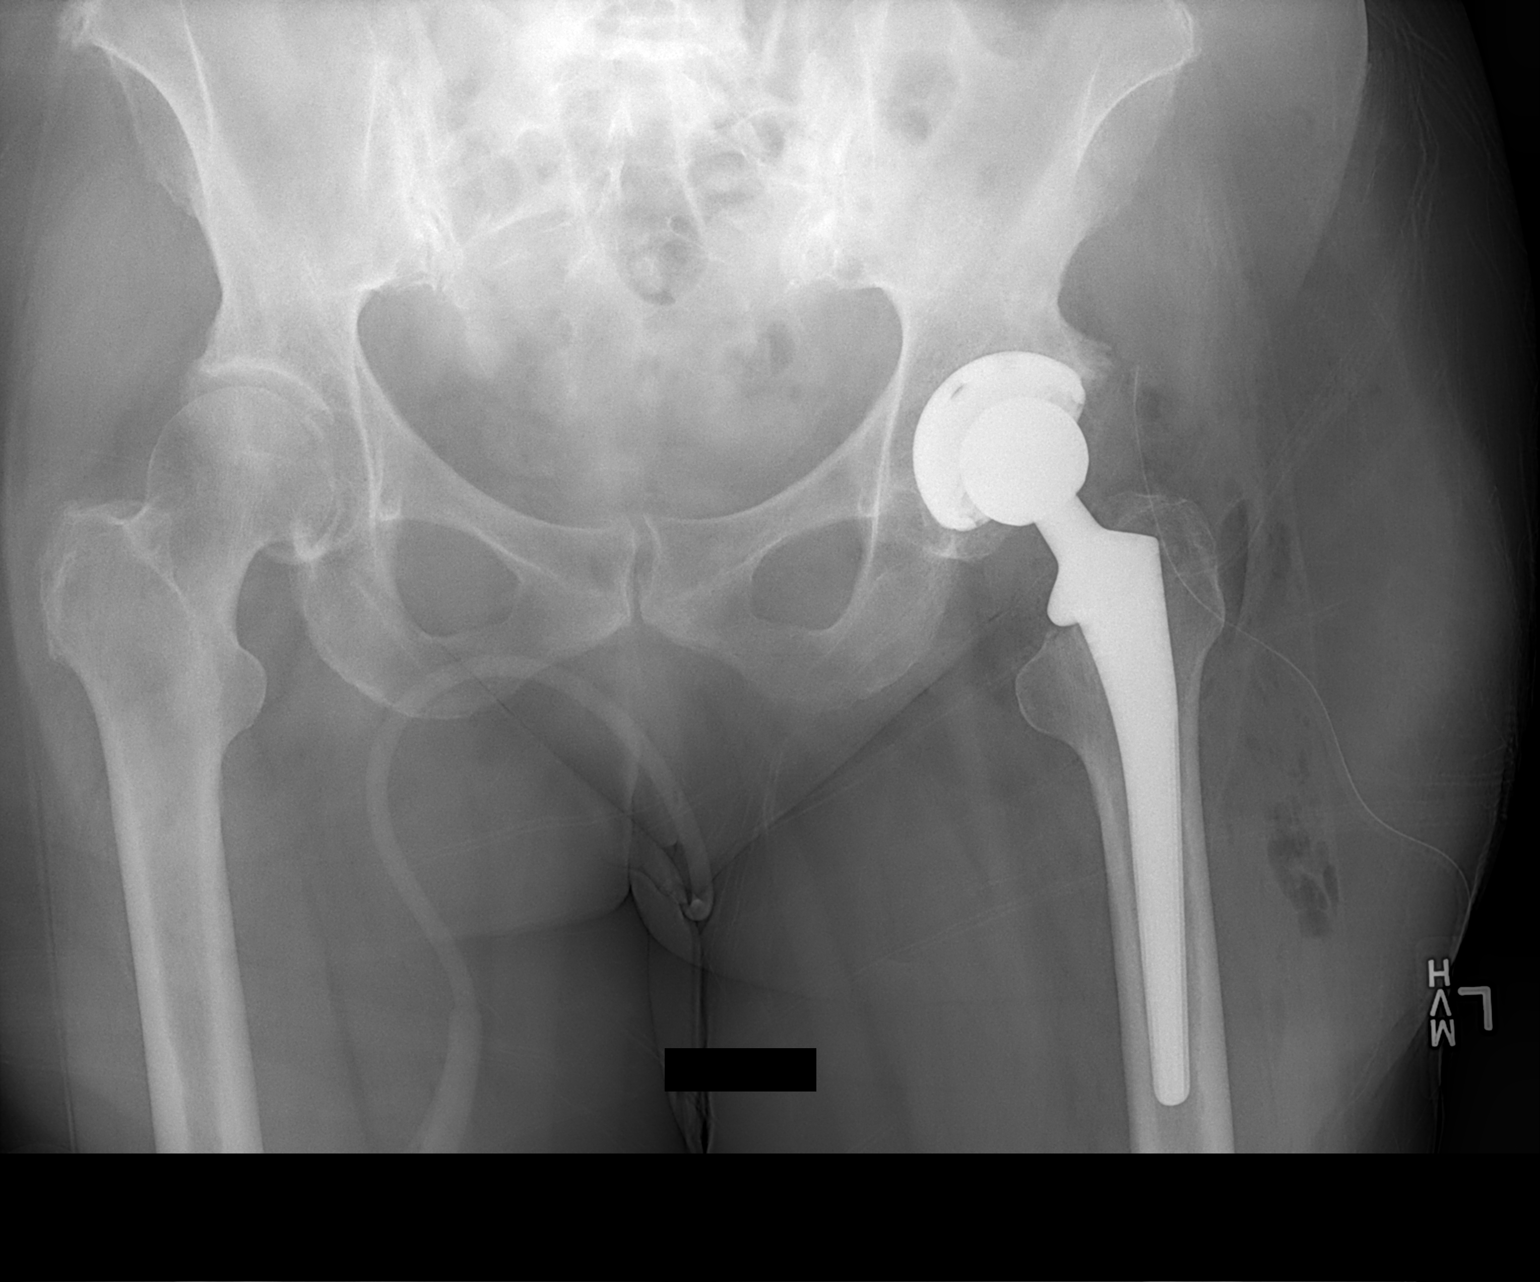

[1 of 1 positions shown; findings below may reference images not displayed]

FINDINGS: A left hip prosthesis seen. No acute bony abnormality is noted. No
dislocation is noted. A surgical drain is noted. No gross soft
tissue abnormality is seen.
IMPRESSION: Status post left hip replacement

## 2016-12-25 IMAGING — CT CT ABD-PELV W/ CM
2 of 5 series · 14 of 36 positions shown, 17 images · IV contrast (OMNIPAQUE)
Comparison: CTs 04/19/2015 and 02/21/2015.

ADDENDUM:
Clinical research values below were measured using RECIST protocol
1.0 criteria.
CLINICAL DATA: Restaging metastatic breast cancer. Oral
chemotherapy ongoing. Recist protocol.

EXAM:
CT CHEST, ABDOMEN, AND PELVIS WITH CONTRAST
TECHNIQUE: Multidetector CT imaging of the chest, abdomen and pelvis was
performed following the standard protocol during bolus
administration of intravenous contrast.
CONTRAST:  100mL OMNIPAQUE IOHEXOL 300 MG/ML  SOLN

[Series 2: cap with st · axial · 0.83mm/px · z∈[-586,-46]mm · 11 of 124 slices shown, 14 images]
[im 8/124  mediastinal]
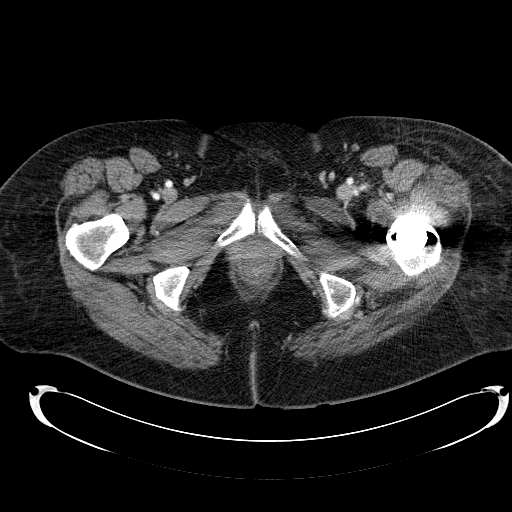
[im 8/124  lung]
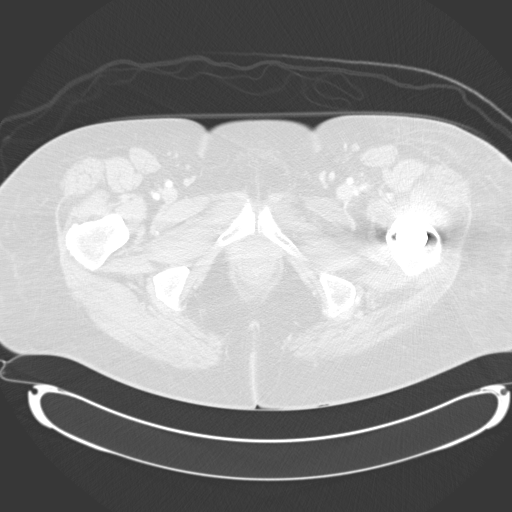
[im 24/124  lung]
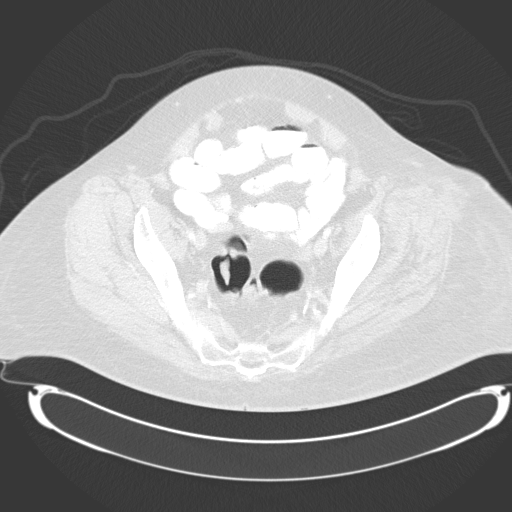
[im 31/124  lung]
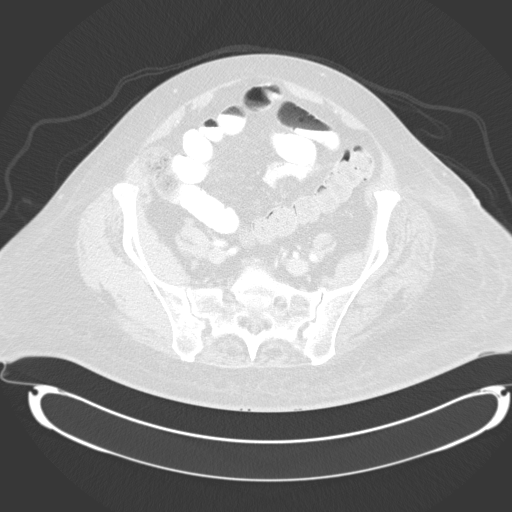
[im 39/124  lung]
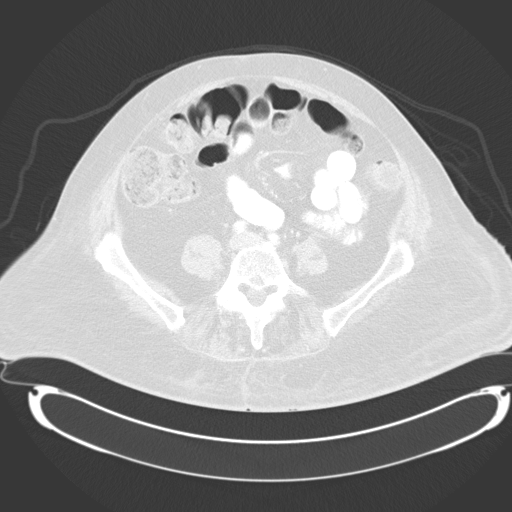
[im 54/124  mediastinal]
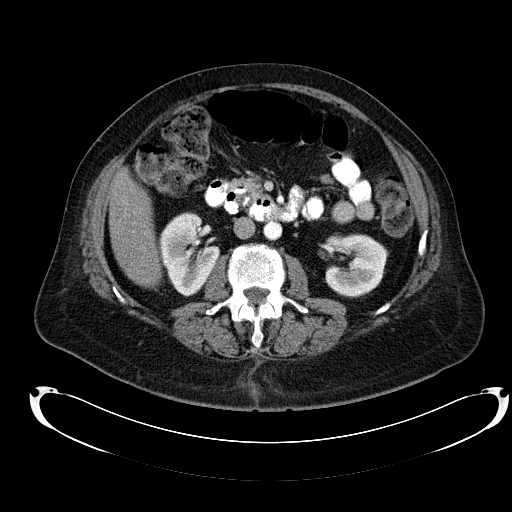
[im 54/124  lung]
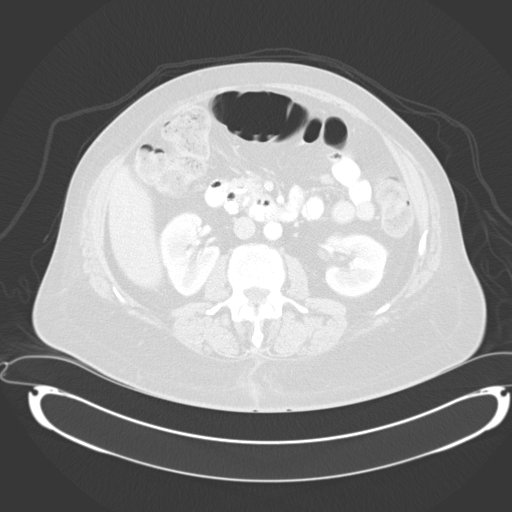
[im 62/124  lung]
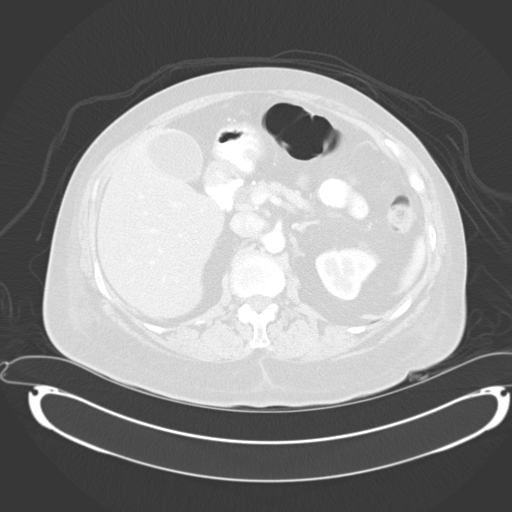
[im 70/124  lung]
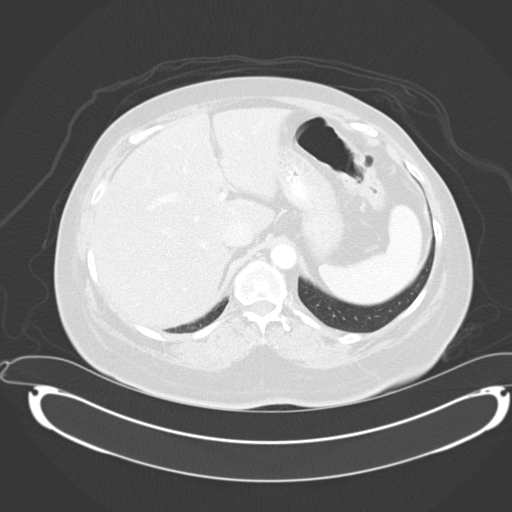
[im 85/124  lung]
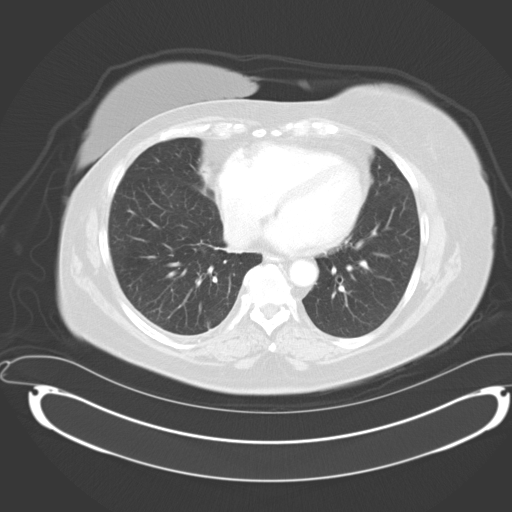
[im 93/124  mediastinal]
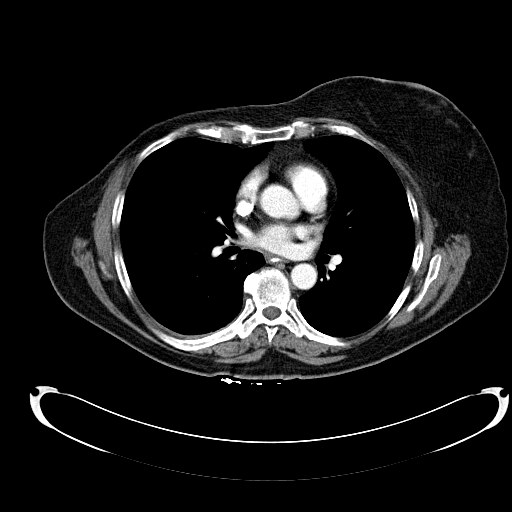
[im 93/124  lung]
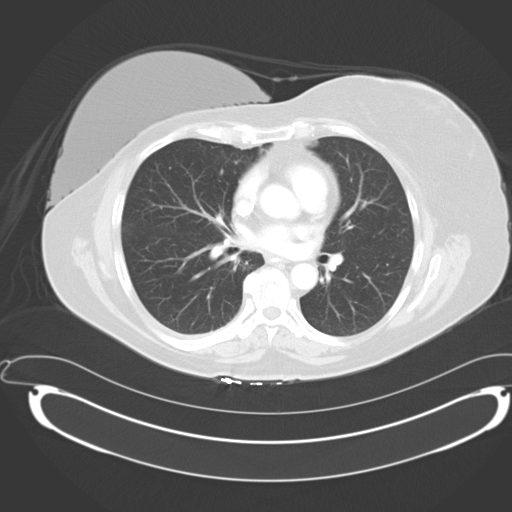
[im 100/124  lung]
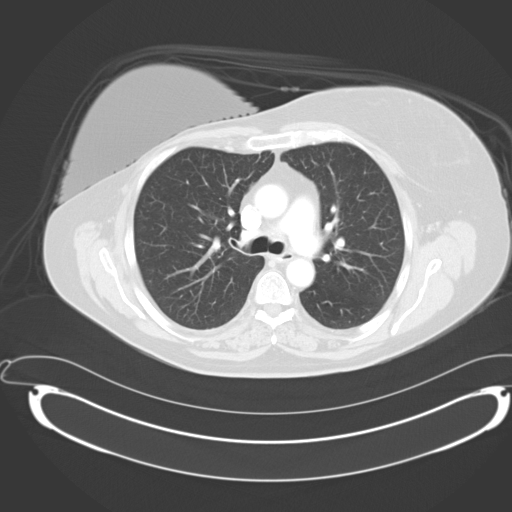
[im 116/124  lung]
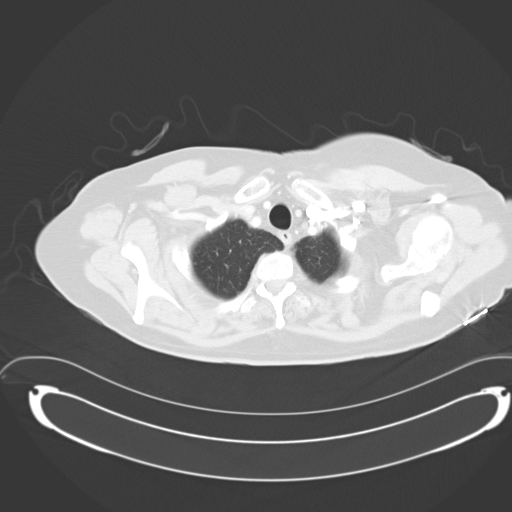

[Series 602: coronal images · coronal · 1.24mm/px · 3 of 93 slices shown]
[im 19/93  lung]
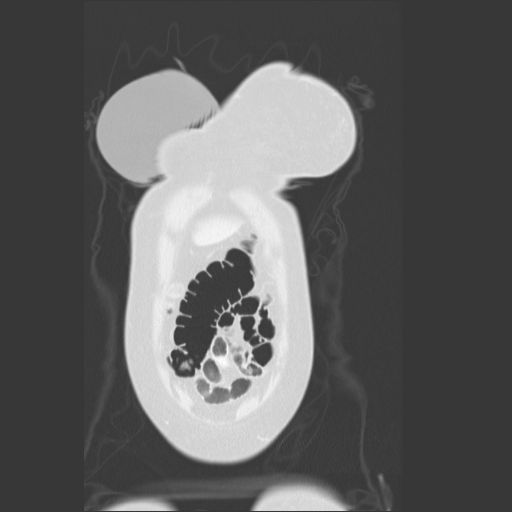
[im 37/93  lung]
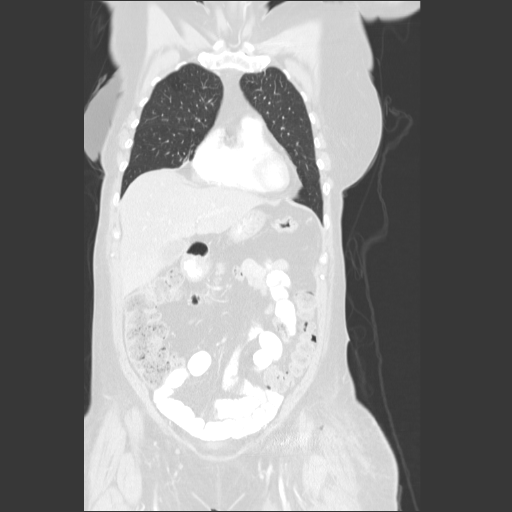
[im 56/93  lung]
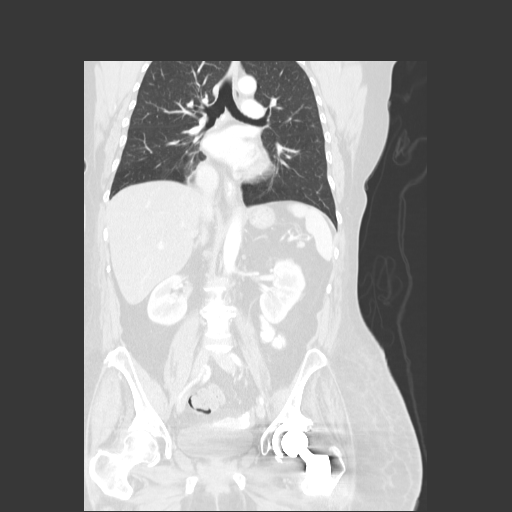

[14 of 36 positions shown; findings below may reference images not displayed]

FINDINGS: RECIST

Target Lesions:

1. Subpleural right lower lobe nodule measures 5 mm on image 38 of
series 4, previously 6 mm.
2. Partially calcified subcarinal lymph node measures 6 mm on image
28, unchanged.
3. Previously demonstrated right hilar lymph node, no longer
measurable.
Non-target Lesions:

1. Minimal pleural thickening on the right. No significant pleural
effusion.

CT CHEST FINDINGS

Mediastinum/Nodes: There are no enlarged mediastinal, hilar,
internal mammary or axillary lymph nodes. Calcified subcarinal node
is unchanged, measuring 6 mm on image 28. Tiny low-density lesions
within the thyroid gland are stable. The heart size is normal. There
is no pericardial effusion. Mild atherosclerosis appears unchanged.

Lungs/Pleura: There is no pleural effusion.Mild pleural thickening
is present on the right. There is stable linear scarring in the
right lower lobe, most obvious on the reformatted images. The small
nodular component along the lateral aspect of this scarring is
stable, measuring 5 mm on image 30. The small right upper lobe
nodule seen on the most recent study is no longer visualized. No new
or enlarging pulmonary nodules.

Musculoskeletal/Chest wall: Stable postsurgical changes status post
right mastectomy. No chest wall lesion or suspicious osseous
findings.

CT ABDOMEN AND PELVIS FINDINGS

Hepatobiliary: Diffuse hepatic steatosis is again noted.
Hypervascular lesion in the right lobe on image 51 of series 2 is
unchanged, consistent with a transient perfusion phenomenon. No
suspicious hepatic findings. The gallbladder demonstrates stable
mild wall thickening in the its fundus. No evidence of gallstones or
surrounding inflammation. There is mild extrahepatic biliary
dilatation with the common bile duct measuring up to 8 mm in
diameter.

Pancreas: Unremarkable. No pancreatic ductal dilatation or
surrounding inflammatory changes.

Spleen: Normal in size without focal abnormality.

Adrenals/Urinary Tract: There is a stable 12 mm left adrenal nodule
consistent with an adenoma. The right adrenal gland appears normal.
The kidneys appear normal without evidence of urinary tract
calculus, suspicious lesion or hydronephrosis. No bladder
abnormalities are seen.

Stomach/Bowel: No evidence of bowel wall thickening, distention or
surrounding inflammatory change. Moderate stool throughout the
colon. The appendix appears normal.

Vascular/Lymphatic: There are no enlarged abdominal or pelvic lymph
nodes. Stable mild aortoiliac atherosclerosis.

Reproductive: Unremarkable.

Other: No ascites or peritoneal nodularity.

Musculoskeletal: Interval left total hip arthroplasty with beam
hardening artifact contributing to limited evaluation of the
inferior pelvis. There are stable sclerotic lesions in the left
sacrum and left iliac bone. No lytic lesion or pathologic fracture
identified. Stable postsurgical changes at L3-4.
IMPRESSION: 1. Stable examinations without findings suspicious for progressive
disease.
2. Small right lower lobe nodule is unchanged, likely nodular
scarring.
3. Stable sclerotic lesions within the left sacrum and left iliac
bone, likely treated metastases.

## 2017-01-21 ENCOUNTER — Encounter (HOSPITAL_COMMUNITY): Payer: Self-pay

## 2017-01-21 ENCOUNTER — Ambulatory Visit (HOSPITAL_COMMUNITY)
Admission: RE | Admit: 2017-01-21 | Discharge: 2017-01-21 | Disposition: A | Discharge status: Home / Self Care | Payer: Medicare Other | Source: Ambulatory Visit | Attending: Oncology | Admitting: Oncology

## 2017-01-21 ENCOUNTER — Other Ambulatory Visit (HOSPITAL_BASED_OUTPATIENT_CLINIC_OR_DEPARTMENT_OTHER): Payer: Medicare Other

## 2017-01-21 DIAGNOSIS — C50411 Malignant neoplasm of upper-outer quadrant of right female breast: Secondary | ICD-10-CM

## 2017-01-21 DIAGNOSIS — C7800 Secondary malignant neoplasm of unspecified lung: Secondary | ICD-10-CM | POA: Diagnosis not present

## 2017-01-21 DIAGNOSIS — C50911 Malignant neoplasm of unspecified site of right female breast: Secondary | ICD-10-CM

## 2017-01-21 DIAGNOSIS — K760 Fatty (change of) liver, not elsewhere classified: Secondary | ICD-10-CM | POA: Diagnosis not present

## 2017-01-21 DIAGNOSIS — R918 Other nonspecific abnormal finding of lung field: Secondary | ICD-10-CM | POA: Insufficient documentation

## 2017-01-21 DIAGNOSIS — J439 Emphysema, unspecified: Secondary | ICD-10-CM | POA: Diagnosis not present

## 2017-01-21 DIAGNOSIS — I3131 Malignant pericardial effusion in diseases classified elsewhere: Secondary | ICD-10-CM

## 2017-01-21 DIAGNOSIS — C801 Malignant (primary) neoplasm, unspecified: Secondary | ICD-10-CM

## 2017-01-21 DIAGNOSIS — C50919 Malignant neoplasm of unspecified site of unspecified female breast: Secondary | ICD-10-CM | POA: Diagnosis not present

## 2017-01-21 DIAGNOSIS — Z170 Estrogen receptor positive status [ER+]: Secondary | ICD-10-CM

## 2017-01-21 DIAGNOSIS — I700 Atherosclerosis of aorta: Secondary | ICD-10-CM | POA: Insufficient documentation

## 2017-01-21 DIAGNOSIS — I313 Pericardial effusion (noninflammatory): Secondary | ICD-10-CM

## 2017-01-21 DIAGNOSIS — I318 Other specified diseases of pericardium: Secondary | ICD-10-CM | POA: Diagnosis not present

## 2017-01-21 LAB — COMPREHENSIVE METABOLIC PANEL
ALBUMIN: 3.5 g/dL (ref 3.5–5.0)
ALK PHOS: 266 U/L — AB (ref 40–150)
ALT: 45 U/L (ref 0–55)
AST: 67 U/L — AB (ref 5–34)
Anion Gap: 13 mEq/L — ABNORMAL HIGH (ref 3–11)
BUN: 19.2 mg/dL (ref 7.0–26.0)
CO2: 21 meq/L — AB (ref 22–29)
Calcium: 10.7 mg/dL — ABNORMAL HIGH (ref 8.4–10.4)
Chloride: 106 mEq/L (ref 98–109)
Creatinine: 1 mg/dL (ref 0.6–1.1)
EGFR: 57 mL/min/{1.73_m2} — ABNORMAL LOW (ref >90)
Glucose: 185 mg/dl — ABNORMAL HIGH (ref 70–140)
Potassium: 4.3 mEq/L (ref 3.5–5.1)
SODIUM: 140 meq/L (ref 136–145)
TOTAL PROTEIN: 8.4 g/dL — AB (ref 6.4–8.3)
Total Bilirubin: 0.36 mg/dL (ref 0.20–1.20)

## 2017-01-21 LAB — CBC WITH DIFFERENTIAL/PLATELET
BASO%: 0.4 % (ref 0.0–2.0)
Basophils Absolute: 0 10*3/uL (ref 0.0–0.1)
EOS%: 1.9 % (ref 0.0–7.0)
Eosinophils Absolute: 0.1 10*3/uL (ref 0.0–0.5)
HCT: 44.7 % (ref 34.8–46.6)
HEMOGLOBIN: 14.8 g/dL (ref 11.6–15.9)
LYMPH%: 18.6 % (ref 14.0–49.7)
MCH: 26.8 pg (ref 25.1–34.0)
MCHC: 33.1 g/dL (ref 31.5–36.0)
MCV: 80.8 fL (ref 79.5–101.0)
MONO#: 0.5 10*3/uL (ref 0.1–0.9)
MONO%: 7.4 % (ref 0.0–14.0)
NEUT%: 71.7 % (ref 38.4–76.8)
NEUTROS ABS: 5 10*3/uL (ref 1.5–6.5)
Platelets: 265 10*3/uL (ref 145–400)
RBC: 5.53 10*6/uL — AB (ref 3.70–5.45)
RDW: 14.6 % — AB (ref 11.2–14.5)
WBC: 6.9 10*3/uL (ref 3.9–10.3)
lymph#: 1.3 10*3/uL (ref 0.9–3.3)

## 2017-01-21 MED ORDER — IOPAMIDOL (ISOVUE-300) INJECTION 61%
INTRAVENOUS | Status: AC
  Filled 2017-01-21: qty 75

## 2017-01-21 MED ORDER — IOPAMIDOL (ISOVUE-300) INJECTION 61%
75.0000 mL | Freq: Once | INTRAVENOUS | Status: AC | PRN
  Administered 2017-01-21: 75 mL via INTRAVENOUS

## 2017-01-22 LAB — CANCER ANTIGEN 27.29: CA 27.29: 68.1 U/mL — ABNORMAL HIGH (ref 0.0–38.6)

## 2017-01-23 ENCOUNTER — Encounter: Payer: Medicare Other | Admitting: *Deleted

## 2017-01-23 ENCOUNTER — Ambulatory Visit (HOSPITAL_BASED_OUTPATIENT_CLINIC_OR_DEPARTMENT_OTHER): Payer: Medicare Other | Admitting: Oncology

## 2017-01-23 VITALS — BP 129/58 | HR 90 | Temp 98.4°F | Ht 64.0 in | Wt 159.2 lb

## 2017-01-23 DIAGNOSIS — C7800 Secondary malignant neoplasm of unspecified lung: Secondary | ICD-10-CM

## 2017-01-23 DIAGNOSIS — Z170 Estrogen receptor positive status [ER+]: Secondary | ICD-10-CM | POA: Diagnosis not present

## 2017-01-23 DIAGNOSIS — C50411 Malignant neoplasm of upper-outer quadrant of right female breast: Secondary | ICD-10-CM

## 2017-01-23 DIAGNOSIS — Z79811 Long term (current) use of aromatase inhibitors: Secondary | ICD-10-CM

## 2017-01-23 DIAGNOSIS — C50911 Malignant neoplasm of unspecified site of right female breast: Secondary | ICD-10-CM

## 2017-01-23 DIAGNOSIS — R7989 Other specified abnormal findings of blood chemistry: Secondary | ICD-10-CM | POA: Diagnosis not present

## 2017-01-23 DIAGNOSIS — I313 Pericardial effusion (noninflammatory): Secondary | ICD-10-CM

## 2017-01-23 DIAGNOSIS — Z006 Encounter for examination for normal comparison and control in clinical research program: Secondary | ICD-10-CM | POA: Diagnosis not present

## 2017-01-23 DIAGNOSIS — K760 Fatty (change of) liver, not elsewhere classified: Secondary | ICD-10-CM | POA: Insufficient documentation

## 2017-01-23 DIAGNOSIS — C773 Secondary and unspecified malignant neoplasm of axilla and upper limb lymph nodes: Secondary | ICD-10-CM

## 2017-01-23 DIAGNOSIS — C801 Malignant (primary) neoplasm, unspecified: Secondary | ICD-10-CM

## 2017-01-23 DIAGNOSIS — I3131 Malignant pericardial effusion in diseases classified elsewhere: Secondary | ICD-10-CM

## 2017-01-23 MED ORDER — INV-EVEROLIMUS (RAD0001) 5MG TABLET NOVARTIS CRAD001Y24135: 2.0000 | ORAL_TABLET | Freq: Every day | ORAL | 0 refills | Status: DC

## 2017-01-23 NOTE — Progress Notes (Signed)
01/23/2017 Patient in to clinic unaccompanied today for routine 12-week follow-up visit and study drug dispensing. Patient returned 6 kits of everolimus with 4 tablets remaining (2 daily doses of 2 tablets each), showing 97.6% compliance for the time period 10/31/2016 - 01/22/2017. Patient returned paper calendar documenting daily doses of letrozole and everolimus with one daily dose missed on 11/30/16 and another daily dose missed on 01/10/17. Medication was returned to pharmacy for drug accountability by pharmacist Henreitta Leber. Patient reports the previously noted AE's as ongoing and unchanged in severity. Patient reports that she has noticed ongoing occasional blurry vision in her left eye, unchanged from previous report. She has not yet scheduled an appointment with her ophthalmologist for additional follow up. Based on lab results review and history and physical by Dr. Jana Hakim, patient condition is acceptable for continued treatment, and in his opinion she is still receiving benefit from her study treatment.   Six kits of study drug everolimus were dispensed today for the next 12 weeks supply. In addition, patient will continue commercial supply of letrozole 2.5mg  daily. The next study visit is expected to occur on 04/17/2017, however patient reports that she will be on vacation in [place] from 04/09/2017 through 04/23/2017. Told patient that I would notify [name] of the vacation plans, so that the next visit can be coordinated per protocol requirements. Patient is in agreement with this plan. Thanked patient for her participation in this trial. Cindy S. Brigitte Pulse BSN, RN, [place] 01/23/2017 3:14 PM  Adverse Event Log Protocol: [employer] [id number] Cycle: Extension Phase #5 - 10/31/16 - 01/23/17 Ongoing and unchanged in severity: fatigue, hot flashes (mild), memory impairment, dry skin, and back pain  Event Grade Onset Date Resolved Date Attribution to letrozole Attribution to everolimus  Blurred  vision, left eye 1 06/28/2016 Ongoing Not suspected Not suspected  Alkaline phosphatase increased 1 08/14/2012 Ongoing Not suspected Suspected  AST increased 1 11/30/2015 Ongoing Not suspected Suspected  ALT increased 1 10/31/2016 01/21/2017 Not suspected Suspected  Albumin decreased 1 05/16/2016 01/21/2017 Not suspected Suspected  Hypercalcemia 1 01/21/2017 Ongoing Not suspected Not suspected  Cindy S. Brigitte Pulse BSN, RN, CCRP 02/18/2017 1:16 PM

## 2017-01-23 NOTE — Progress Notes (Signed)
Barbara Parks  [phone] Fax:(336) [phone]     ID: Barbara Parks DOB: 06-16-1951  MR#: [id number]  ATF#:[id number]  Patient Care Team: Darcus Austin, MD as PCP - General (Family Medicine) Jerline Pain, MD (Cardiology) Gaye Pollack, MD (Cardiothoracic Surgery) Kyan Yurkovich, Virgie Dad, MD as Consulting Physician (Oncology) OTHER MD: Dr Mitzie Na, DDS; Gaynelle Arabian MD  CHIEF COMPLAINT: metastatic breast cancer, on BOLERO study  CURRENT TREATMENT: letrozole + everolimus  BREAST CANCER HISTORY: From Dr. Collier [name]'s original intake note 04/13/2004:  "This woman has been in good health all of her life. She recently moved from [place] to work here.  She palpated a mass at about the 12 o'clock position in mid-July. She has not noticed any nipple retraction or skin changes.  She was seen by her primary care doctor who subsequently referred her for a mammogram.  Mammogram was performed on 03/01/04. This demonstrated a spiculated 2 cm mass at the 12 o'clock position in the right breast.  Upper outer quadrant of the left breast shows some distortion.   Physical exam at that time showed a firm, nontender nodule at the 12 o'clock position in the right breast, 5 cm from the nipple.  Ultrasound of this area showed a hypoechoic ill-defined mass, measuring 2.2 x 1.3 x 1.4 cm.  Physical exam of the left breast showed general vague thickening, upper outer quadrant of the left breast with a discrete palpable mass. The ultrasound performed showed a single hypoechoic ill-defined nodule at the 1 o'clock position, measuring 7 x 5 x 6 mm.  She had biopsies of both lesions on 03/02/03.  Needle core biopsy of the lesion on the right breast revealed invasive mammary carcinoma.  Needle core biopsy of the left breast showed a complex fibroadenoma.  Prognostic panel of the lesion on the right breast showed it to be ER positive at 73%, PR positive at 90% and proliferative index 9%,  HER-2 was 1+.  Patient was referred to Dr. Annamaria Boots, who performed a simple mastectomy with sentinel lymph node evaluation on 03/22/04.  Final pathology showed this to be an invasive ductal carcinoma  with lobular features, measuring 2.2 cm, grade 2 of 3.  Margins negative for carcinoma.  Invasive ductal carcinoma was extended to involve deep dermis of the nipple.  Lymphovascular invasion was identified.  Total of 4 sentinel lymph nodes were evaluated.  Touch imprints at the time of the OR was felt to be negative.  Subsequent evaluation showed a 5 mm focus of metastatic carcinoma in one of the four lymph nodes on microscopic after sectioning.  There was extracapsular extension  of one lymph node as well. The remaining three lymph nodes were all negative."  The patient's subsequent history is detailed below.  INTERVAL HISTORY: Barbara Parks returns today for follow-up and treatment of her estrogen receptor positive metastatic breast cancer. She continues on the [facility] trial this being her extension face visit #6. This means that in addition to letrozole, she takes everolimus, 5 mg daily.  She does not have problems with hot flashes and vaginal dryness or aches and pains from the letrozole. She tolerates the everolimus without any mouth sores, cough or other evidence of pneumonitis, rash, or other symptoms. She continues in clinical remission, which is a major benefit from the treatment she is receiving.  She is now able to play the full 18 holes of golf although she will not tell me the score for the last 9. The score for  the first 9 was 38.  REVIEW OF SYSTEMS: She continues to heal as far as her hip replacement is concerned though she still limps a little and when she overdoes it as she has been doing recently, she can have some discomfort there. She is traveling a lot with family who were coming to visit and she is planning a trip to [place] with a relative. She denies any nausea or vomiting problems and  there has been no change in her weight as compared to 2 months ago. He detailed review of systems today was otherwise stable   PAST MEDICAL HISTORY: Past Medical History:  Diagnosis Date  . Arthritis    left hip  . Asthma   . breast ca 2005   breast/chemo R mastectomy  . GERD (gastroesophageal reflux disease)   . Hypercholesteremia   . Metastasis to lung ([place]) dx'd 08/2011  . Osteopenia due to cancer therapy 09/09/2013  . Palpitations   . Shortness of breath     PAST SURGICAL HISTORY: Past Surgical History:  Procedure Laterality Date  . BREAST SURGERY  2005   right  . CATARACT EXTRACTION W/PHACO Left 01/18/2014   Procedure: CATARACT EXTRACTION PHACO AND INTRAOCULAR LENS PLACEMENT (IOC);  Surgeon: Elta Guadeloupe T. Gershon Crane, MD;  Location: [facility];  Service: Ophthalmology;  Laterality: Left;  CDE 15.79  . CATARACT EXTRACTION W/PHACO Right 02/08/2014   Procedure: CATARACT EXTRACTION PHACO AND INTRAOCULAR LENS PLACEMENT (IOC);  Surgeon: Elta Guadeloupe T. Gershon Crane, MD;  Location: [facility];  Service: Ophthalmology;  Laterality: Right;  CDE 4.41  . CHEST TUBE INSERTION  09/11/2011   Procedure: INSERTION PLEURAL DRAINAGE CATHETER;  Surgeon: Gaye Pollack, MD;  Location: [place];  Service: Thoracic;  Laterality: Right;  . PERICARDIAL WINDOW  09/11/2011   Procedure: PERICARDIAL WINDOW;  Surgeon: Gaye Pollack, MD;  Location: [facility] OR;  Service: Thoracic;  Laterality: N/A;  . REMOVAL OF PLEURAL DRAINAGE CATHETER  12/19/2011   Procedure: REMOVAL OF PLEURAL DRAINAGE CATHETER;  Surgeon: Gaye Pollack, MD;  Location: [place];  Service: Thoracic;  Laterality: Right;  TO BE DONE IN MINOR ROOM, SHORT STAY  . [place]  . TOTAL HIP ARTHROPLASTY Left 06/07/2015   Procedure: LEFT TOTAL HIP ARTHROPLASTY ANTERIOR APPROACH;  Surgeon: Gaynelle Arabian, MD;  Location: [facility];  Service: Orthopedics;  Laterality: Left;  Marland Kitchen VIDEO BRONCHOSCOPY  09/11/2011   Procedure: VIDEO BRONCHOSCOPY;  Surgeon: Gaye Pollack, MD;  Location: [facility] OR;   Service: Thoracic;  Laterality: N/A;    FAMILY HISTORY Family History  Problem Relation Age of Onset  . Cancer Mother        breast  . Heart disease Father   . Anesthesia problems Neg Hx    the patient's mother was diagnosed with breast cancer at age 9, she died age 4 from congestive heart failure..The patient's father died from heart disease at age 77.  She has one sister alive & well.  Two brothers alive & well, one with diabetes. The patient's sister was also diagnosed with breast cancer, and was tested for the BRCA gene, and was negative. The patient herself has not been tested. There is no history of ovarian cancer in the family  GYNECOLOGIC HISTORY:  No LMP recorded. Patient is postmenopausal. Menarche age 79, the patient is GX P0. She stopped having periods with chemotherapy in 2005. 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 She was a Archivist. She is single, lives alone with her Shitzu-poodle Sammie.. Family is all in the [place] area.     ADVANCED DIRECTIVES: Not in place. At the 02/23/2014 visit the patient was given the appropriate documents to complete and notarize at her discretion. She tells me she is planning to name her sister, Billie Ruddy, as healthcare power of attorney. [name] can be reached at [phone]   HEALTH MAINTENANCE: Social History  Substance Use Topics  . Smoking status: Former Smoker    Packs/day: 1.50    Years: 30.00    Types: Cigarettes    Quit date: 09/10/2007  . Smokeless tobacco: Not on file  . Alcohol use No     Colonoscopy:  PAP:  Bone density: 08/14/2015, T score -1.6  Lipid panel:  Allergies  Allergen Reactions  . Aspirin     REACTION: upset stomach  . Latex Other (See Comments)    Blistering and skin peels off  . Nsaids Nausea And Vomiting    Extreme nausea and vomiting    Current Outpatient  Prescriptions  Medication Sig Dispense Refill  . cholecalciferol (VITAMIN D) 1000 units tablet Take 1,000 Units by mouth 2 (two) times daily.    Marland Kitchen gemfibrozil (LOPID) 600 MG tablet TAKE ONE TABLET BY MOUTH TWICE DAILY BEFORE A MEAL 180 tablet 0  . Investigational everolimus (RAD001) 5 MG tablet Nov[employer]is [id number] Take 2 tablets by mouth daily. Take with a glass of water. 168 tablet 0  . letrozole (FEMARA) 2.5 MG tablet Take 1 tablet (2.5 mg total) by mouth daily. 90 tablet 3  . loperamide (IMODIUM) 2 MG capsule Take 2 mg by mouth 4 (four) times daily as needed for diarrhea or loose stools. Reported on 11/30/2015    . metoprolol tartrate (LOPRESSOR) 25 MG tablet Take 0.5 tablets (12.5 mg total) by mouth daily. 30 tablet 4  . omeprazole (PRILOSEC) 40 MG capsule Take 1 capsule (40 mg total) by mouth daily as needed. 90 capsule 3  . ondansetron (ZOFRAN) 8 MG tablet Take 1 tablet (8 mg total) by mouth 2 (two) times daily as needed (Nausea or vomiting). (Patient not taking: Reported on 09/20/2015) 30 tablet 1  . prochlorperazine (COMPAZINE) 25 MG suppository Place 1 suppository (25 mg total) rectally every 12 (twelve) hours as needed for nausea. (Patient not taking: Reported on 09/20/2015) 12 suppository 3   No current facility-administered medications for this visit.     OBJECTIVE: Middle-aged white woman In no acute distress  Vitals:   01/23/17 1102 01/23/17 1103  BP: 130/63 (!) 129/58  Pulse:    Temp:       Body mass index is 27.33 kg/m.    ECOG FS:1 - Symptomatic but completely ambulatory  Sclerae unicteric, EOMs intact Oropharynx clear and moist No cervical or supraclavicular adenopathy Lungs no rales or rhonchi Heart regular rate and rhythm Abd soft, nontender, positive bowel sounds MSK no focal spinal tenderness, no upper extremity lymphedema, slight right leg limp Neuro: nonfocal, well oriented, appropriate affect Breasts: She status post right mastectomy with no evidence of chest  wall recurrence. Left breast is benign. Both axillae are benign.   LAB  RESULTS:  CMP     Component Value Date/Time   NA 140 01/21/2017 1029   K 4.3 01/21/2017 1029   CL 106 09/20/2015 1257   CL 109 (H) 12/31/2012 0940   CO2 21 (L) 01/21/2017 1029   GLUCOSE 185 (H) 01/21/2017 1029  GLUCOSE 127 (H) 12/31/2012 0940   BUN 19.2 01/21/2017 1029   CREATININE 1.0 01/21/2017 1029   CALCIUM 10.7 (H) 01/21/2017 1029   PROT 8.4 (H) 01/21/2017 1029   ALBUMIN 3.5 01/21/2017 1029   AST 67 (H) 01/21/2017 1029   ALT 45 01/21/2017 1029   ALKPHOS 266 (H) 01/21/2017 1029   BILITOT 0.36 01/21/2017 1029   GFRNONAA >60 09/20/2015 1257   GFRAA >60 09/20/2015 1257    I No results found for: SPEP  Lab Results  Component Value Date   WBC 6.9 01/21/2017   NEUTROABS 5.0 01/21/2017   HGB 14.8 01/21/2017   HCT 44.7 01/21/2017   MCV 80.8 01/21/2017   PLT 265 01/21/2017      Chemistry      Component Value Date/Time   NA 140 01/21/2017 1029   K 4.3 01/21/2017 1029   CL 106 09/20/2015 1257   CL 109 (H) 12/31/2012 0940   CO2 21 (L) 01/21/2017 1029   BUN 19.2 01/21/2017 1029   CREATININE 1.0 01/21/2017 1029      Component Value Date/Time   CALCIUM 10.7 (H) 01/21/2017 1029   ALKPHOS 266 (H) 01/21/2017 1029   AST 67 (H) 01/21/2017 1029   ALT 45 01/21/2017 1029   BILITOT 0.36 01/21/2017 1029       Lab Results  Component Value Date   LABCA2 58 (H) 09/14/2012    No components found for: [id number]  No results for input(s): INR in the last 168 hours.  Urinalysis    Component Value Date/Time   COLORURINE YELLOW 09/20/2015 1432   APPEARANCEUR HAZY (A) 09/20/2015 1432   LABSPEC >1.030 (H) 09/20/2015 1432   LABSPEC 1.030 07/13/2015 0841   PHURINE 5.5 09/20/2015 1432   GLUCOSEU 500 (A) 09/20/2015 1432   GLUCOSEU Negative 07/13/2015 0841   HGBUR MODERATE (A) 09/20/2015 1432   BILIRUBINUR NEGATIVE 09/20/2015 1432   BILIRUBINUR Negative 07/13/2015 0841   KETONESUR NEGATIVE 09/20/2015  1432   PROTEINUR 30 (A) 09/20/2015 1432   UROBILINOGEN 0.2 07/13/2015 0841   NITRITE NEGATIVE 09/20/2015 1432   LEUKOCYTESUR NEGATIVE 09/20/2015 1432   LEUKOCYTESUR Negative 07/13/2015 0841    STUDIES: Ct Chest W Contrast  Result Date: 01/21/2017 CLINICAL DATA:  Breast cancer. EXAM: CT CHEST WITH CONTRAST TECHNIQUE: Multidetector CT imaging of the chest was performed during intravenous contrast administration. CONTRAST:  12m ISOVUE-300 IOPAMIDOL (ISOVUE-300) INJECTION 61% COMPARISON:  07/16/2016 FINDINGS: RECIST 1.1 Target Lesions: 1. 5 mm right lower lobe pulmonary nodule (image 109 series 5) is unchanged since prior study. 2. 6 mm short axis subcarinal lymph node (image 80 series 2) is unchanged in the interval since prior study. Non-target Lesions: 1. Stable appearance mild right pleural thickening. 2. Small cluster of tiny nodules in the right apex for stable in the interval, with largest lesion measuring 3 mm. Cardiovascular: The heart size is normal. No pericardial effusion. Atherosclerotic calcification is noted in the wall of the thoracic aorta. Mediastinum/Nodes: No mediastinal lymphadenopathy. There is no hilar lymphadenopathy. The esophagus has normal imaging features. There is no axillary lymphadenopathy. Lungs/Pleura: Centrilobular emphysema. Stable right middle lobe scarring. Subsegmental atelectasis noted posterior lower lobes bilaterally. No suspicious pulmonary nodule or mass. No pleural effusion. Upper Abdomen: The liver shows diffusely decreased attenuation suggesting steatosis. Left adrenal nodule measured previously at 10 mm is stable. Musculoskeletal: Deformity and sclerosis of the posterior left eleventh rib is unchanged. Bone windows reveal no worrisome lytic or sclerotic osseous lesions. Right mastectomy. IMPRESSION: 1. Stable exam. No findings to  suggest recurrent or metastatic disease. 2. No change tiny right lung nodules. 3. Hepatic steatosis Emphysema. ([id number]) Aortic  Atherosclerois (ICD10-170.0) Electronically Signed   By: Misty Stanley M.D.   On: 01/21/2017 15:49     ASSESSMENT: 66 y.o. [place],  woman with stage IV breast cancer, on BOLERO-4 trial  (1) status post right mastectomy and sentinel lymph node sampling 03/22/2004 for a right upper-outer quadrant pT2 pN1, stage IIB invasive ductal carcinoma with lobular features, grade 2, estrogen receptor and progesterone receptor positive, HER-2 negative, with an MIB-1 of 9% ([id number] and [id number])  (2) addition all right axillary lymph node sampling 05/07/2004 showed 2 benign lymph nodes (4 lymph nodes previously removed, so total was one positive lymph node out of 6; [id number])  (3) the patient was evaluated by radiation oncology; no postmastectomy radiation recommended  (4) adjuvant chemotherapy with dose dense doxorubicin and cyclophosphamide x4 cycles (first cycle delayed one week) followed by dose dense paclitaxel x4 was completed 09/18/2004  (5) tamoxifen started March 2006, discontinued 2009  METASTATIC DISEASE: February 2013 (6) presenting with a large pericardial effusion, large right pleural effusion and possible right middle lobe bronchial obstruction February 2013, status post pericardial window placement, fiberoptic bronchoscopy and right Pleurx placement 09/11/2011, with biopsy of the bronchus intermedius and pericardium positive for metastatic breast cancer, estrogen receptor 91% positive with moderate staining intensity, progesterone receptor 100% positive with strong staining intensity, with an MIB-1 of 35%, and no HER-2 amplification, the signals ratio being 1.37 ([id number])  (7) enrolled in BOLERO-4 trial 10/10/2011, receiving letrozole and everolimus  (a) two small areas of enhancement in the cerebellum noted by brain MRI 10/03/2011 were no longer apparent on repeat MRI 08/21/2012-- most recent brain MRI 04/01/2014 showed no evidence of intracranial metastatic disease  (b) sclerotic  lesions in left iliac bone and sacrum have not been biopsied; stable; plan is to start zolendronate after patient updates her dental care (extraction planned)  (c) RLL lung nodule, stable (rescanned 07/12/2014 and 08/11/2014) R hilar and subcarinal lymph nodes: stable  (9) additional problems:  (a) hepatic steatosis  (b) COPD/ emphysema/ asthma  (c) advanced L hip osteoarthritis, status post left total hip replacement 06/07/2015  (d) aortoiliac atherosclerosis  (e) dental evaluation pending w possible dental extractions  (f) possible thalassemia trait  (10) Bone density concerns: DEXA scan 08/11/2013 was normal  (11) likely thalassemia trait  (12) hepatic steatosis.  PLAN: Sanoe is now a little over 5 years out from definitive diagnosis of metastatic breast cancer with no evidence of disease activity. This is very favorable.  She continues to tolerate the letrozole and everolimus well. This is clearly can continuing to control her incurable disease. The plan is to continue son as there is no evidence of disease progression.  We reviewed her lab work which shows an slightly elevated alkaline phosphatase, with no trend. This is going to be due to hepatic steatosis which has been repeatedly noted on her CT scans. We also reviewed her CT scan today which is entirely stable  She will return to see me in September. She knows to call for any problems that may develop before her next visit here. Chauncey Cruel, MD   01/23/2017 8:19 PM you to take care

## 2017-01-28 ENCOUNTER — Other Ambulatory Visit: Payer: Self-pay | Admitting: Oncology

## 2017-01-28 DIAGNOSIS — C7800 Secondary malignant neoplasm of unspecified lung: Principal | ICD-10-CM

## 2017-01-28 DIAGNOSIS — C50911 Malignant neoplasm of unspecified site of right female breast: Secondary | ICD-10-CM

## 2017-02-14 ENCOUNTER — Encounter: Payer: Self-pay | Admitting: *Deleted

## 2017-02-17 ENCOUNTER — Other Ambulatory Visit: Payer: Self-pay | Admitting: Oncology

## 2017-02-17 DIAGNOSIS — Z1231 Encounter for screening mammogram for malignant neoplasm of breast: Secondary | ICD-10-CM

## 2017-02-19 IMAGING — CT CT ABD-PELV W/ CM
2 of 5 series · 14 of 46 positions shown, 16 images · IV contrast (OMNIPAQUE)
Comparison: CT the chest, abdomen and pelvis 06/13/2015.

ADDENDUM:
Per request of the primary clinical team, RECIST 1.0 measurements
are as below.

RECIST
Target Lesions:
1. Subpleural right lower lobe nodule measuring 5 mm (image 40 of
series 4), unchanged in size, but slightly less solid in appearance
than the prior study.
2. 6 mm subcarinal lymph node (image 28 of series 2), unchanged.
Non-target Lesions:
1. Minimal right-sided pleural thickening, and trace amount of
right-sided pleural fluid (image 51 of series 2), unchanged compared
to the prior examination.
CLINICAL DATA: 64-year-old female with history breast cancer
diagnosed in 0445, with recurrence in 3562, status post right
mastectomy. Oral chemotherapy in progress. No current complaints.
RECIST protocol.
EXAM:
CT CHEST, ABDOMEN, AND PELVIS WITH CONTRAST
TECHNIQUE: Multidetector CT imaging of the chest, abdomen and pelvis was
performed following the standard protocol during bolus
administration of intravenous contrast.
CONTRAST:  100mL OMNIPAQUE IOHEXOL 300 MG/ML SOLN, 50mL OMNIPAQUE
IOHEXOL 300 MG/ML SOLN

[Series 2: cap with st · axial · 0.70mm/px · z∈[-576,-46]mm · 11 of 122 slices shown, 13 images]
[im 8/122  soft-tissue]
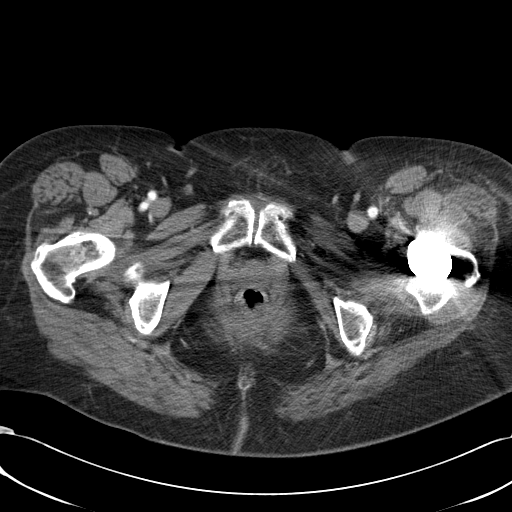
[im 8/122  bone]
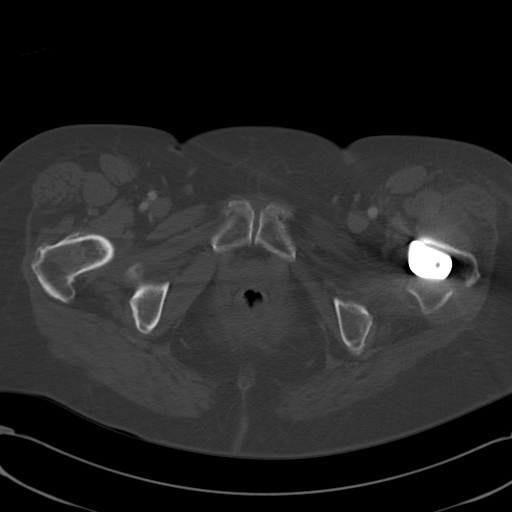
[im 22/122  soft-tissue]
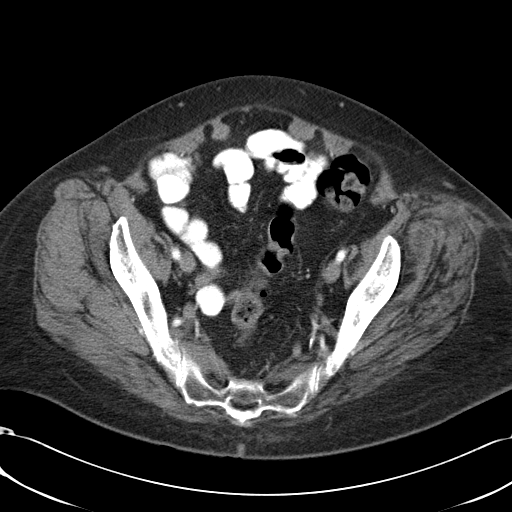
[im 29/122  soft-tissue]
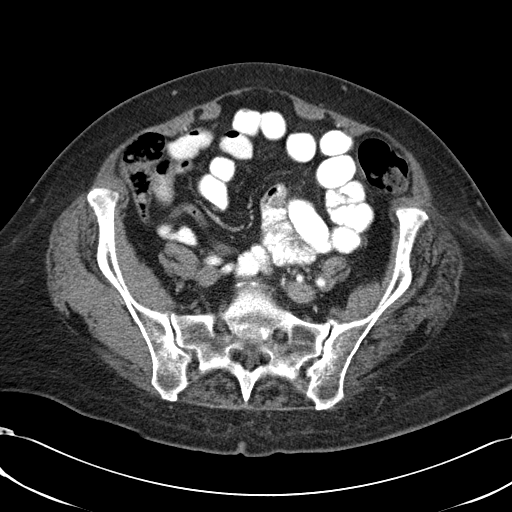
[im 43/122  soft-tissue]
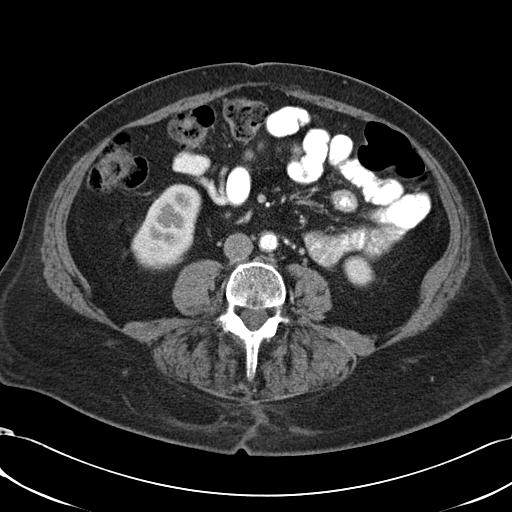
[im 50/122  soft-tissue]
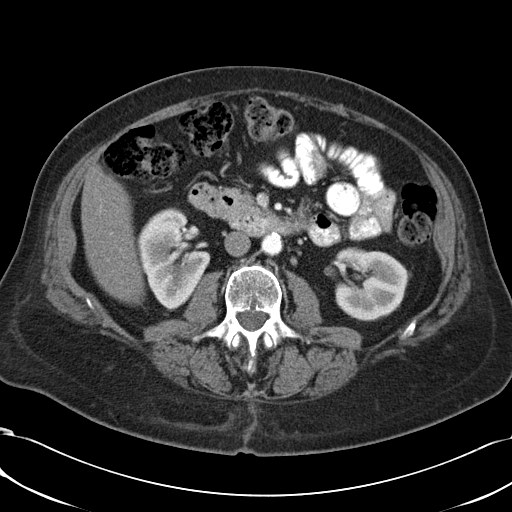
[im 65/122  soft-tissue]
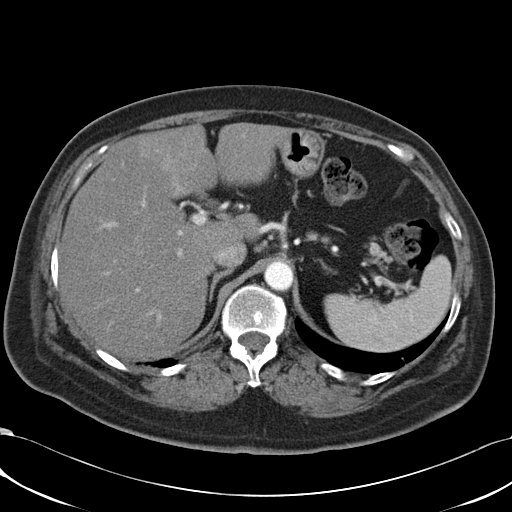
[im 72/122  soft-tissue]
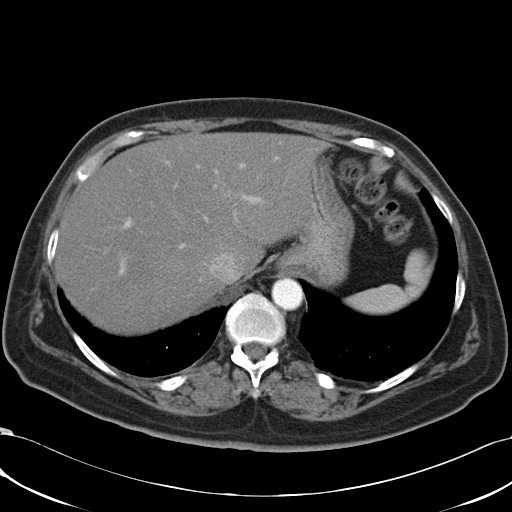
[im 79/122  soft-tissue]
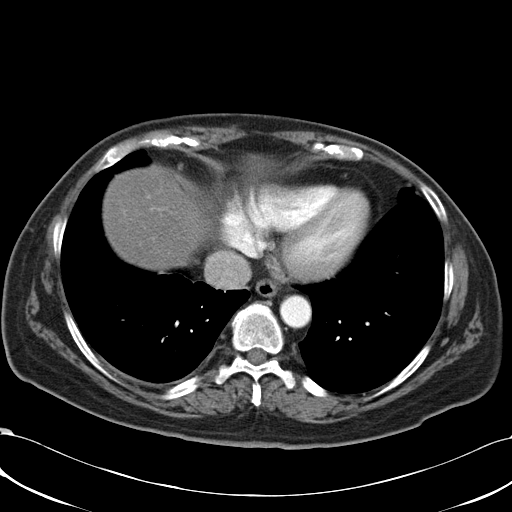
[im 93/122  soft-tissue]
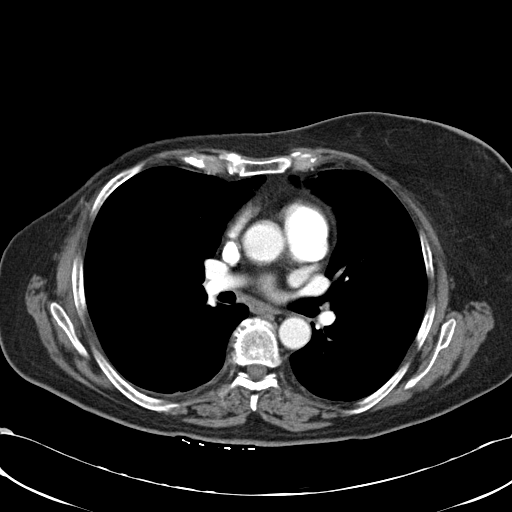
[im 93/122  bone]
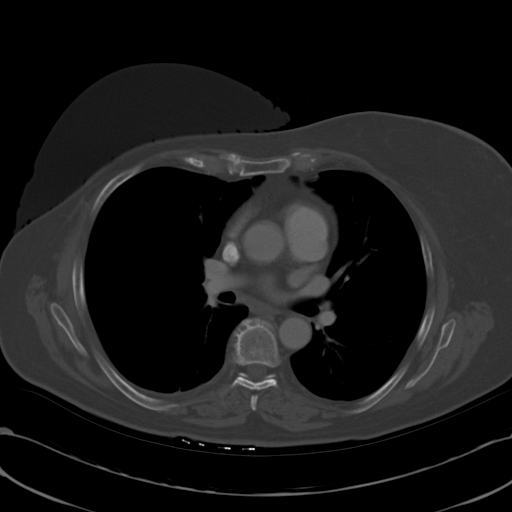
[im 100/122  soft-tissue]
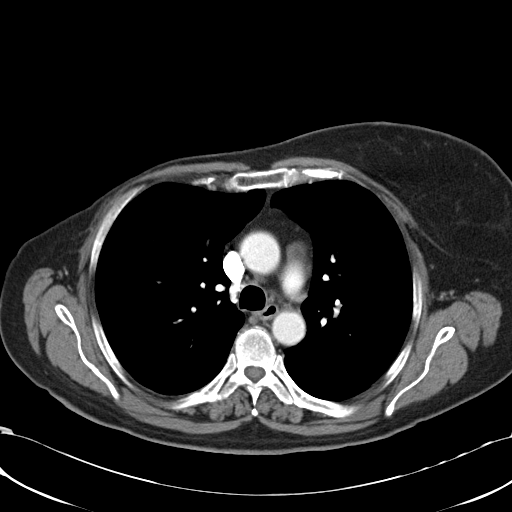
[im 114/122  soft-tissue]
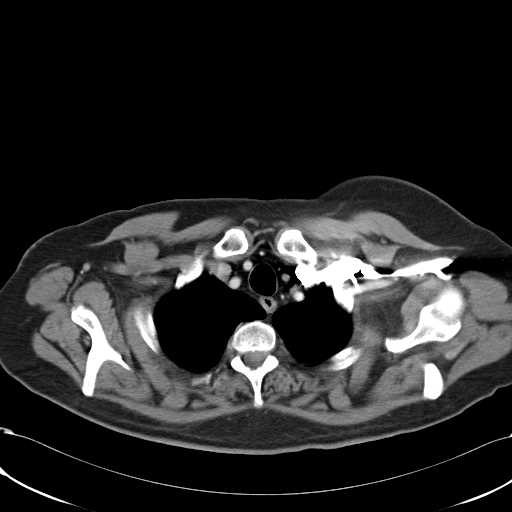

[Series 602: <mpr thick range> · coronal · 1.19mm/px · 3 of 95 slices shown]
[im 32/95  soft-tissue]
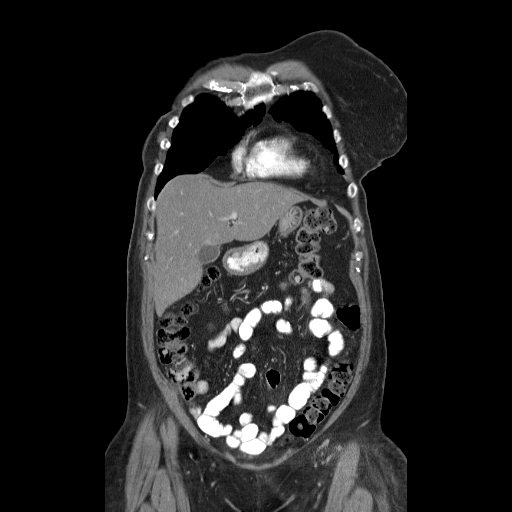
[im 42/95  soft-tissue]
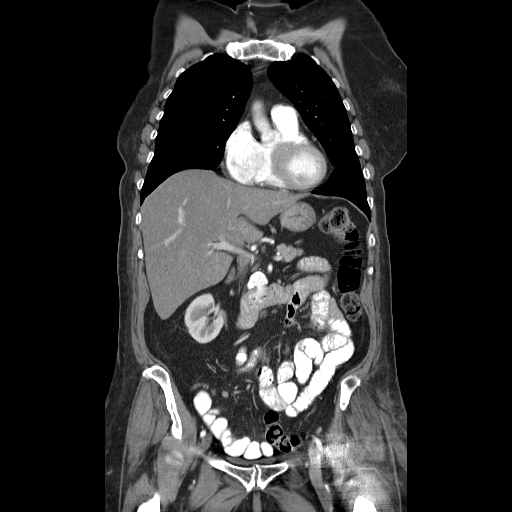
[im 53/95  soft-tissue]
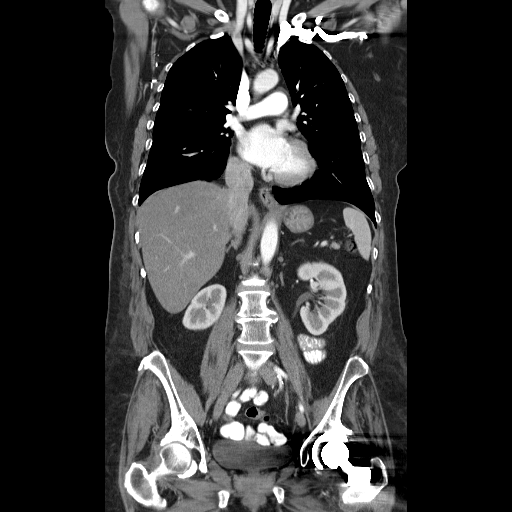

[14 of 46 positions shown; findings below may reference images not displayed]

FINDINGS: RECIST

Target Lesions:

1. No target lesions under RECIST 1.1 criteria.
Non-target Lesions:

1. Persistent small amount of right-sided pleural thickening and
trace right pleural effusion medially, unchanged.
CT CHEST FINDINGS

Mediastinum/Lymph Nodes: Heart size is normal. There is no
significant pericardial fluid, thickening or pericardial
calcification. No pathologically enlarged mediastinal or hilar lymph
nodes. Esophagus is unremarkable in appearance. No axillary
lymphadenopathy. Status post right axillary lymph node dissection.

Lungs/Pleura: Ill-defined 5 mm ground-glass attenuation nodule in
the periphery of the right lower lobe (image 40 of series 4),
slightly less apparent than the prior study, presumably benign.
Trace amount of chronic pleural fluid in the medial aspect of the
lower right hemithorax, unchanged. No other suspicious appearing
pulmonary nodules or masses. No acute consolidative airspace
disease. No left pleural effusion.

Musculoskeletal/Soft Tissues: Status post right modified radical
mastectomy and right axillary lymph node dissection. No evidence of
soft tissue mass along the right chest wall to suggest local
recurrence of disease. There are no aggressive appearing lytic or
blastic lesions noted in the visualized portions of the skeleton.

CT ABDOMEN AND PELVIS FINDINGS

Hepatobiliary: Diffuse low attenuation throughout the hepatic
parenchyma, compatible with hepatic steatosis. No suspicious cystic
or solid hepatic lesions. No intra or extrahepatic biliary ductal
dilatation. Gallbladder is normal in appearance.

Pancreas: No pancreatic mass. No pancreatic ductal dilatation. No
pancreatic or peripancreatic fluid or inflammatory changes.

Spleen: Unremarkable.

Adrenals/Urinary Tract: 1 cm nodule in the lateral limb of the left
adrenal gland is unchanged compared to prior studies, and although
incompletely characterized on today's CT examination, stability
compared to numerous prior examinations strongly favors a benign
adenoma. Right adrenal gland and bilateral kidneys are normal in
appearance. No hydroureteronephrosis. Urinary bladder is normal in
appearance.

Stomach/Bowel: Normal appearance of the stomach. No pathologic
dilatation of small bowel or colon. Normal appendix.

Vascular/Lymphatic: Atherosclerosis throughout the abdominal and
pelvic vasculature, without evidence of aneurysm or dissection. No
lymphadenopathy noted in the abdomen or pelvis.

Reproductive: Uterus and ovaries are atrophic.

Other: No significant volume of ascites.  No pneumoperitoneum.

Musculoskeletal: Status post left hip arthroplasty. Sclerotic
lesions in the anterior aspect of the left ilium (image 94 of series
2) and in the left sacrum (image 96 of series 2) unchanged over
numerous prior examinations, most likely to represent treated
metastatic lesions.
IMPRESSION: 1. Status post right modified radical mastectomy and right axillary
lymph node dissection, without evidence to suggest local recurrence
of disease or metastatic disease in the chest, abdomen or pelvis.
2. 5 mm nodule in the right lower lobe is slightly less apparent and
more ground-glass attenuation in appearance than the prior
examination, presumably benign.
3. Unchanged sclerotic lesions in the left hemipelvis, similar to
numerous prior examinations, most likely to represent treated
metastases.
4. Hepatic steatosis.
5. Stable 1 cm left adrenal nodule, presumably a tiny adenoma.

## 2017-03-24 ENCOUNTER — Ambulatory Visit
Admission: RE | Admit: 2017-03-24 | Discharge: 2017-03-24 | Disposition: A | Discharge status: Home / Self Care | Payer: Medicare Other | Source: Ambulatory Visit | Attending: Oncology | Admitting: Oncology

## 2017-03-24 DIAGNOSIS — Z1231 Encounter for screening mammogram for malignant neoplasm of breast: Secondary | ICD-10-CM | POA: Diagnosis not present

## 2017-04-03 IMAGING — DX DG CHEST 2V
2 series · 2 of 2 positions shown · non-contrast
Comparison: Chest CT 08/08/2015

CLINICAL DATA: Chest pain and shortness of breath.

EXAM:
CHEST  2 VIEW

[chest pa]
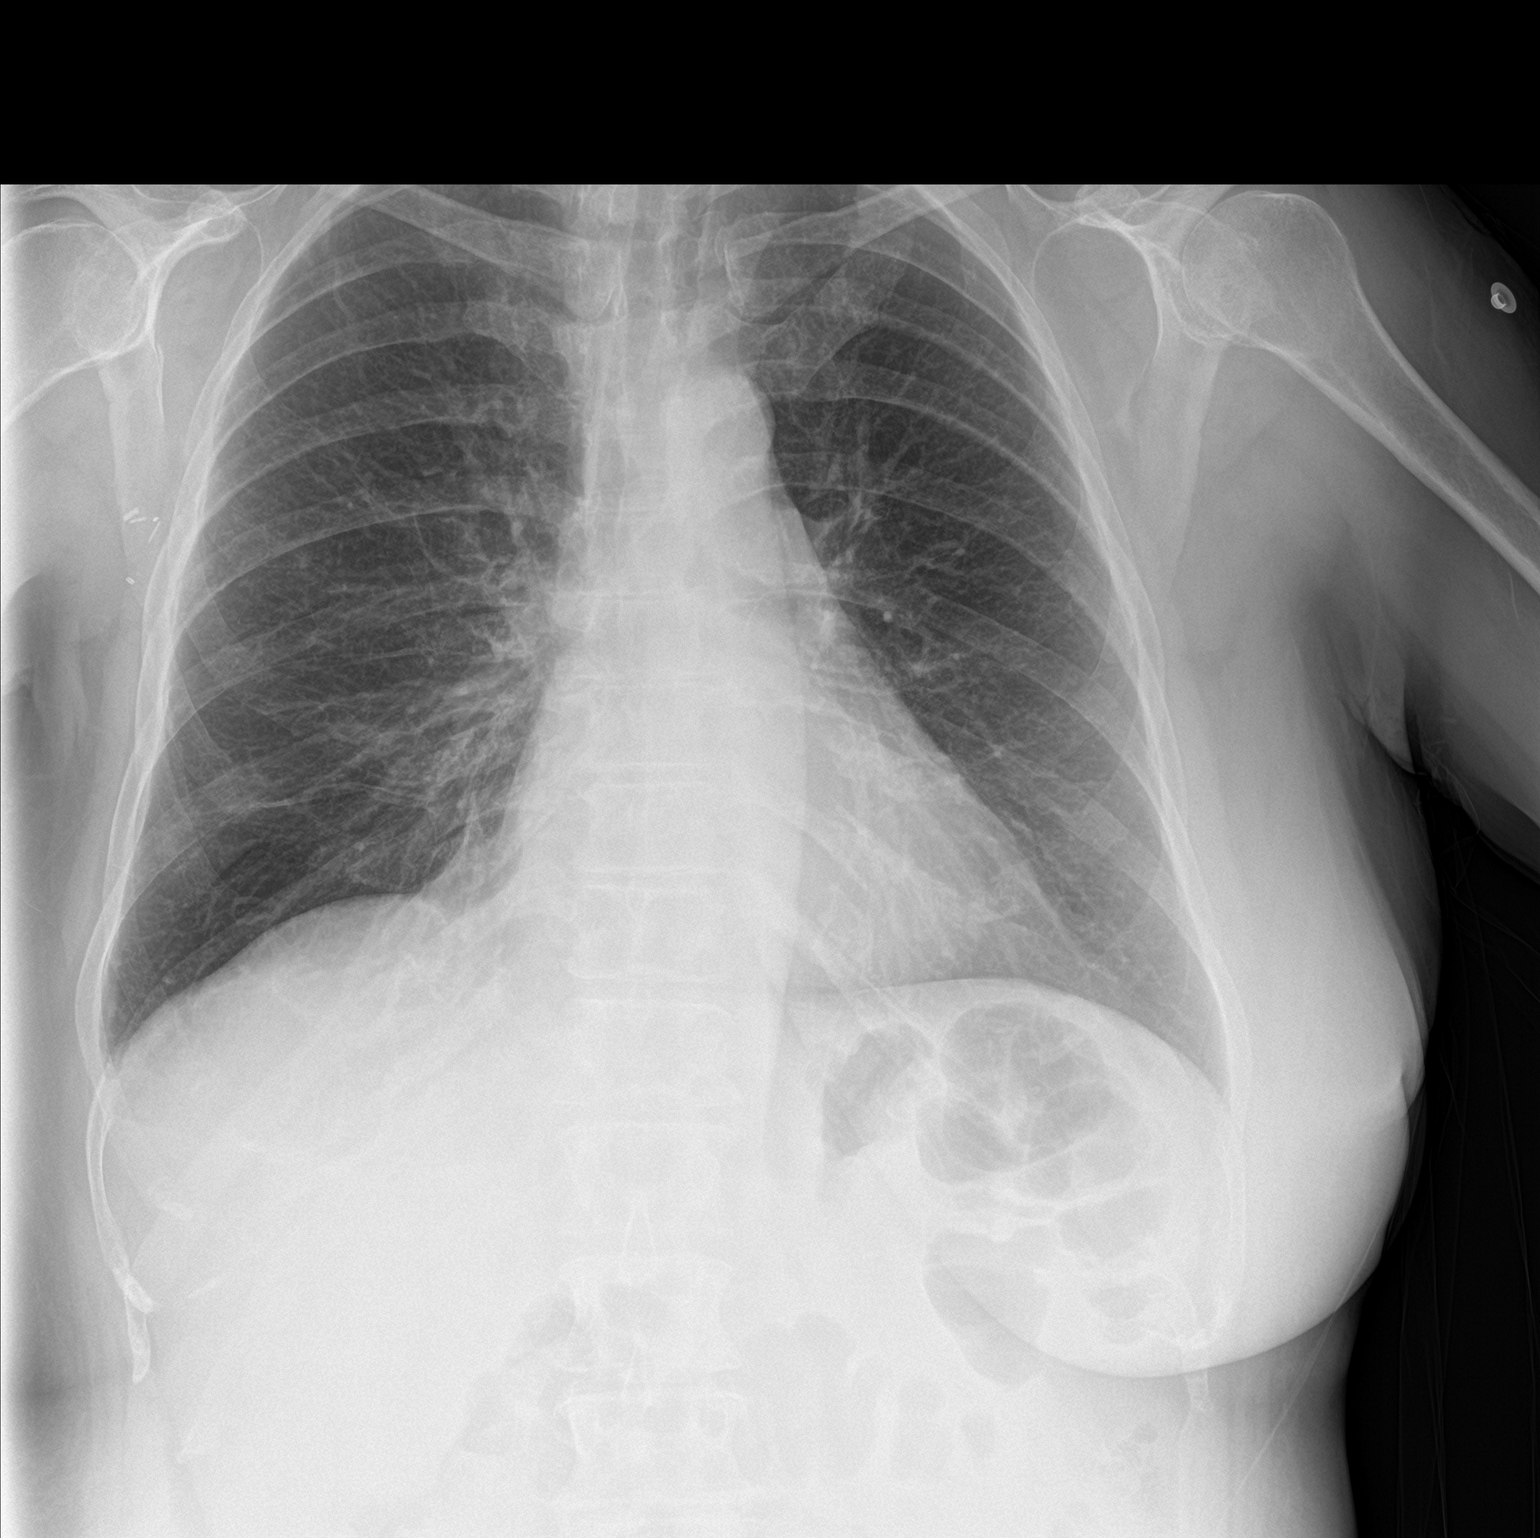

[chest lat]
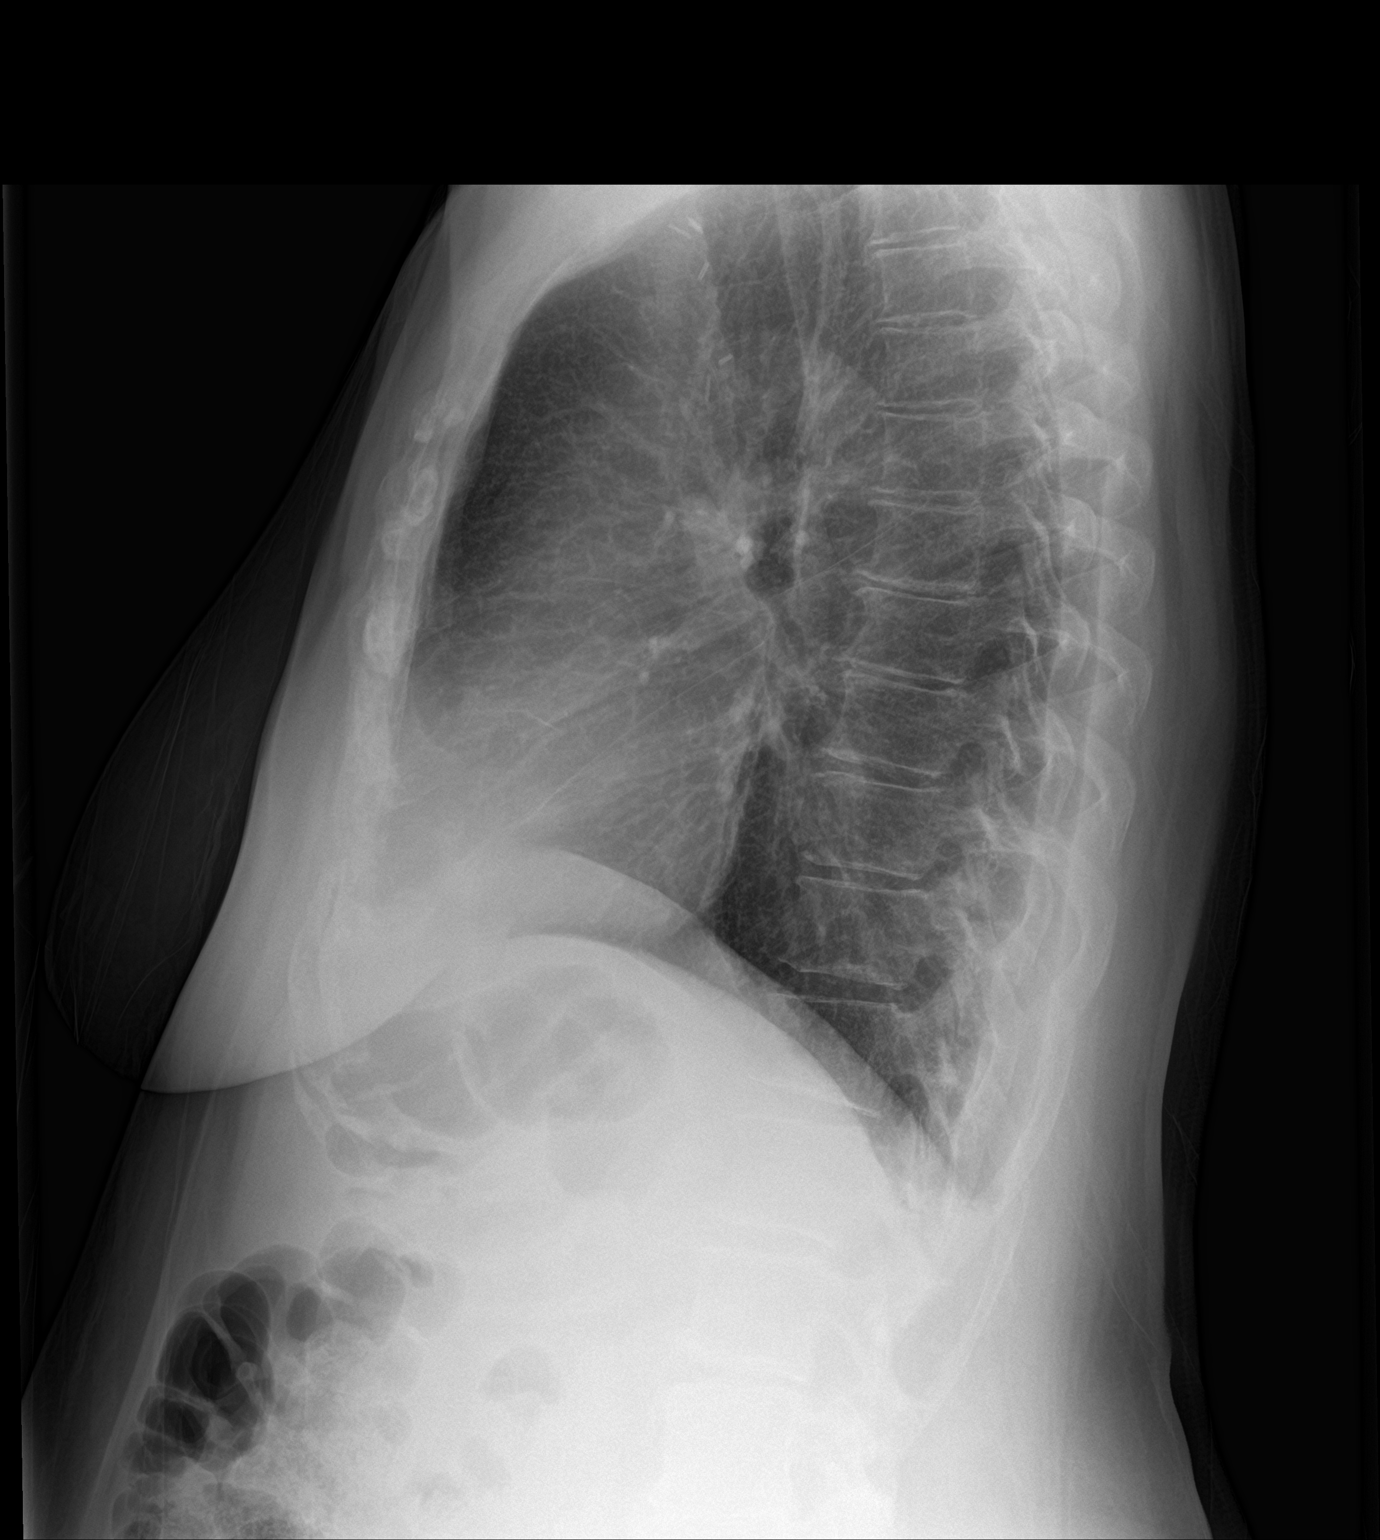

[2 of 2 positions shown; findings below may reference images not displayed]

FINDINGS: Normal heart size and mediastinal contours. No acute infiltrate or
edema. No effusion or pneumothorax.

Right axillary dissection and mastectomy.

No acute osseous findings.
IMPRESSION: No acute finding.

## 2017-04-03 IMAGING — CT CT ANGIO CHEST
1 of 6 series · 5 of 36 positions shown · IV contrast (Omnipaque 300)
Comparison: Chest radiographs 6774 hours today. Restaging CTs
08/08/2015

CLINICAL DATA: 64-year-old female with palpitations for 5 days.
Shortness of breath for 3 days. Breast cancer taking oral
chemotherapy. Subsequent encounter.

EXAM:
CT ANGIOGRAPHY CHEST WITH CONTRAST
TECHNIQUE: Multidetector CT imaging of the chest was performed using the
standard protocol during bolus administration of intravenous
contrast. Multiplanar CT image reconstructions and MIPs were
obtained to evaluate the vascular anatomy.
CONTRAST:  100mL OMNIPAQUE IOHEXOL 350 MG/ML SOLN

[Series 4: pe 3.0 b40f · axial · 0.67mm/px · z∈[+832,+1002]mm · 5 of 87 slices shown]
[im 15/87  lung]
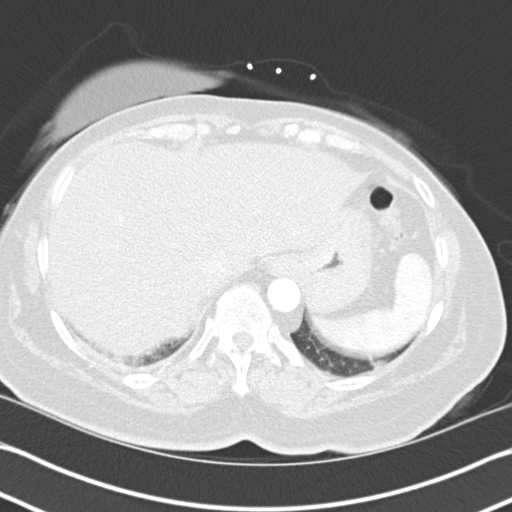
[im 29/87  mediastinal]
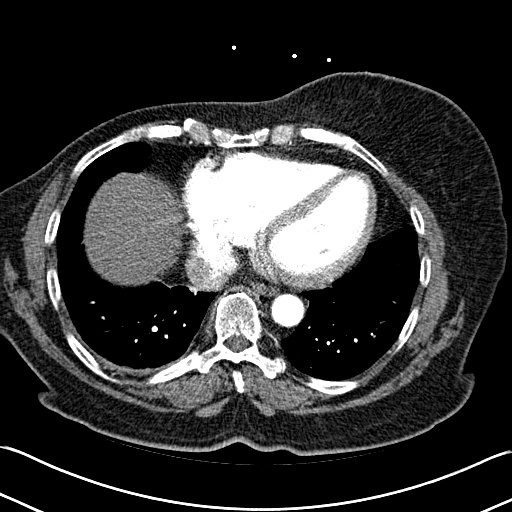
[im 44/87  lung]
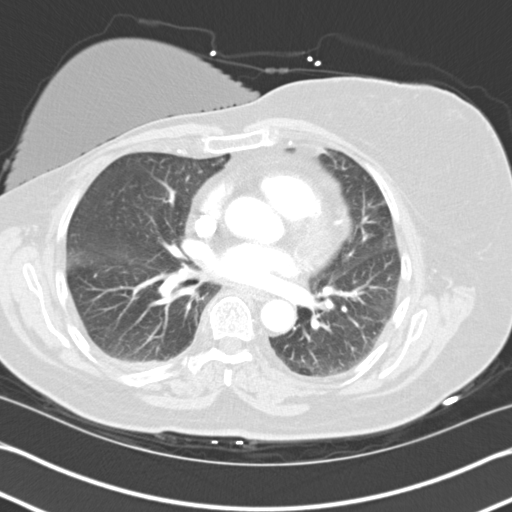
[im 58/87  mediastinal]
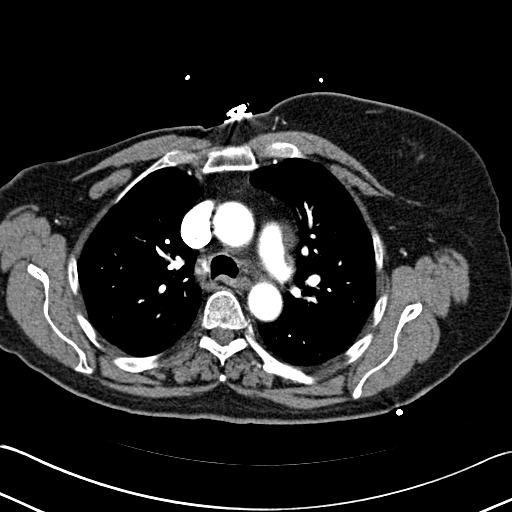
[im 72/87  lung]
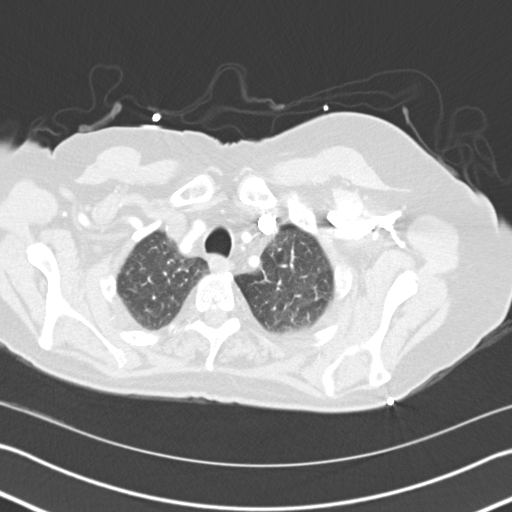

[5 of 36 positions shown; findings below may reference images not displayed]

FINDINGS: Good contrast bolus timing in the pulmonary arterial tree.

No focal filling defect identified in the pulmonary arterial tree to
suggest the presence of acute pulmonary embolism.

Lower lung volumes with bilateral crowding which may explain new
bilateral pulmonary ground-glass opacity. Major airways are patent
aside from atelectatic changes. Trace right pleural effusion or
pleural thickening is unchanged. Stable 5 mm subpleural right lower
lobe nodule (series 5, image 50). Stable mild scarring or
atelectasis in the left costophrenic angle.

No pericardial effusion. Negative thoracic aorta aside from mild
atherosclerosis. Sequelae of right mastectomy. No axillary or
internal mammary lymphadenopathy. No mediastinal or hilar
lymphadenopathy. Mild cardiomegaly.

Hepatic steatosis Re demonstrated. Stable visualized liver, spleen,
pancreas, adrenal glands, and bowel in the upper abdomen.

Stable sclerotic appearance of the posterior left eleventh and right
twelfth ribs. No acute or other suspicious osseous lesion identified
in the chest.

Review of the MIP images confirms the above findings.
IMPRESSION: 1.  No evidence of acute pulmonary embolus.
2. Lower lung volumes with new nonspecific pulmonary ground-glass
opacity. This might simply reflect atelectasis but consider also
atypical infection, less likely developing edema.
3. Otherwise stable CT appearance of the chest since [REDACTED].

## 2017-04-07 ENCOUNTER — Ambulatory Visit: Payer: Medicare Other | Admitting: Oncology

## 2017-04-07 ENCOUNTER — Other Ambulatory Visit: Payer: Medicare Other

## 2017-04-16 IMAGING — CT CT CHEST W/ CM
2 of 5 series · 15 of 46 positions shown, 17 images · IV contrast (OMNIPAQUE)
Comparison: Chest CT of 09/20/2015. Chest item pelvic CTs of
08/08/2015.

CLINICAL DATA: Metastatic breast cancer diagnosed [DATE] and
[DATE].

EXAM:
CT CHEST, ABDOMEN, AND PELVIS WITH CONTRAST
TECHNIQUE: Multidetector CT imaging of the chest, abdomen and pelvis was
performed following the standard protocol during bolus
administration of intravenous contrast.
CONTRAST:  100mL OMNIPAQUE IOHEXOL 300 MG/ML  SOLN

[Series 2: cap with st · axial · 0.74mm/px · z∈[-636,-96]mm · 12 of 124 slices shown, 14 images]
[im 8/124  soft-tissue]
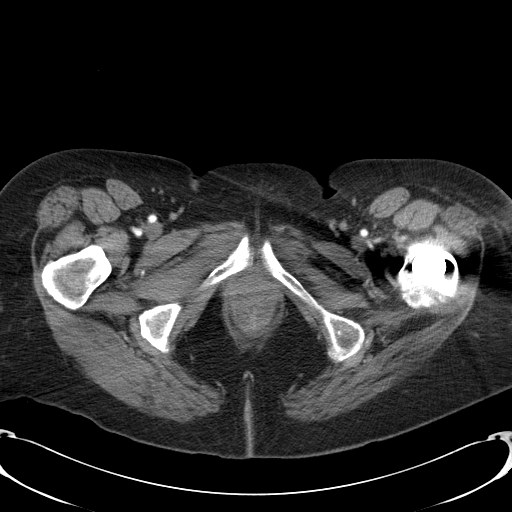
[im 8/124  bone]
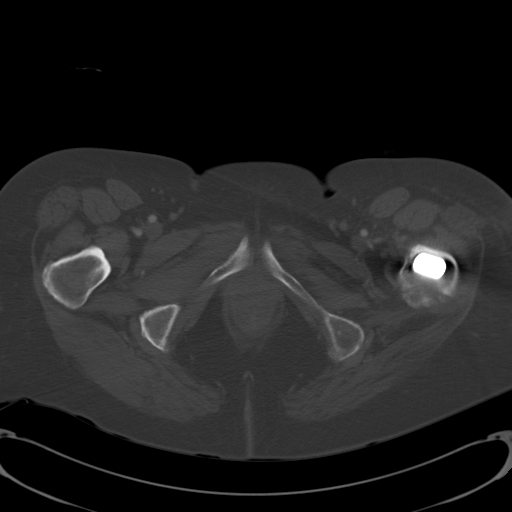
[im 22/124  soft-tissue]
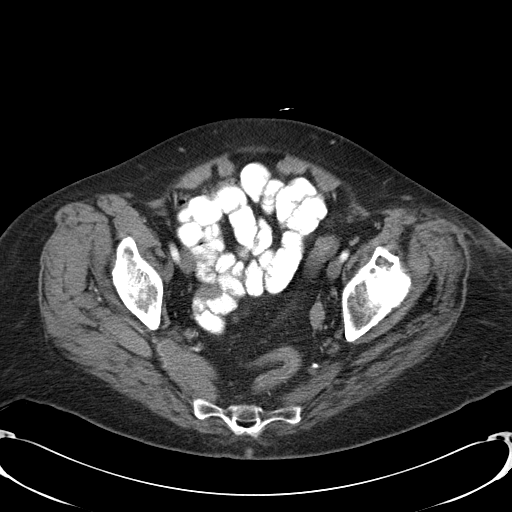
[im 29/124  soft-tissue]
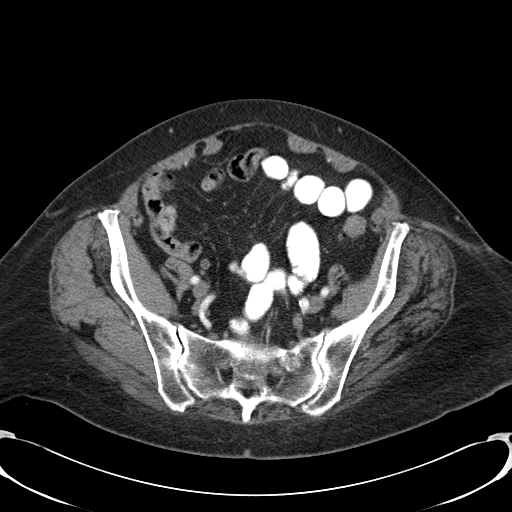
[im 37/124  soft-tissue]
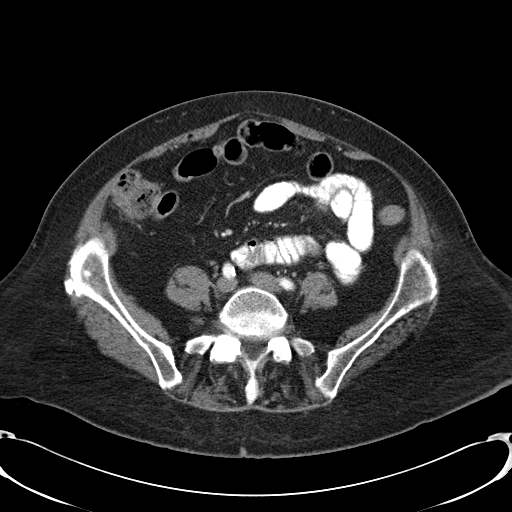
[im 51/124  soft-tissue]
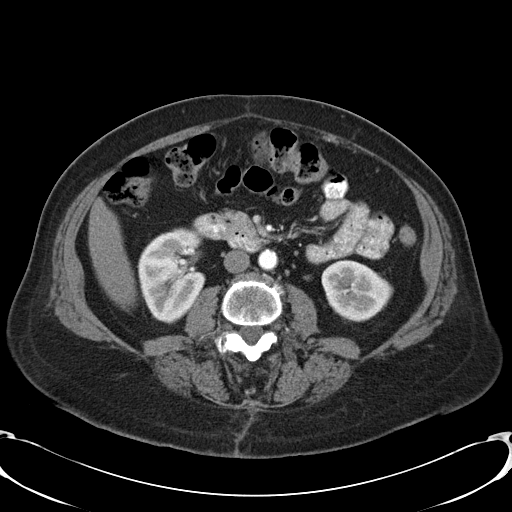
[im 58/124  soft-tissue]
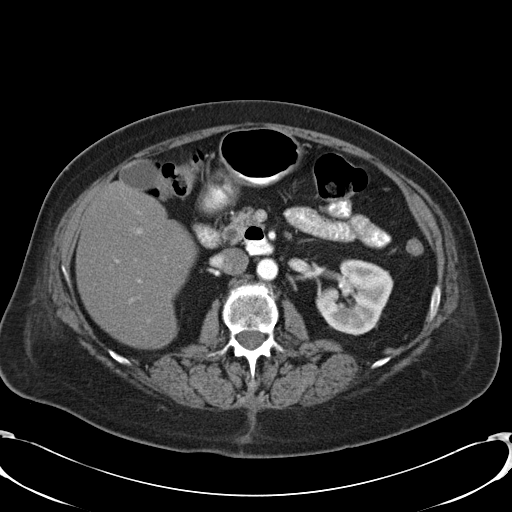
[im 66/124  soft-tissue]
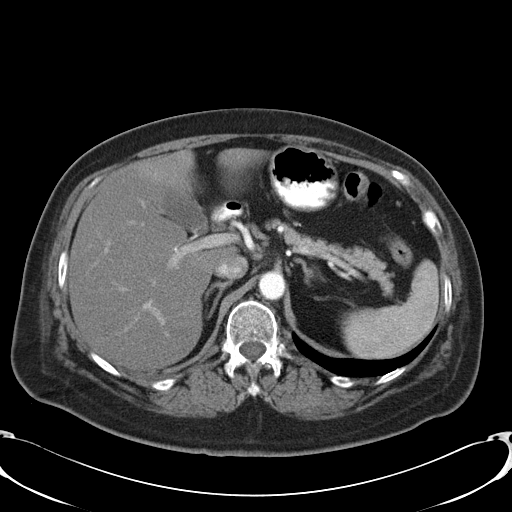
[im 80/124  soft-tissue]
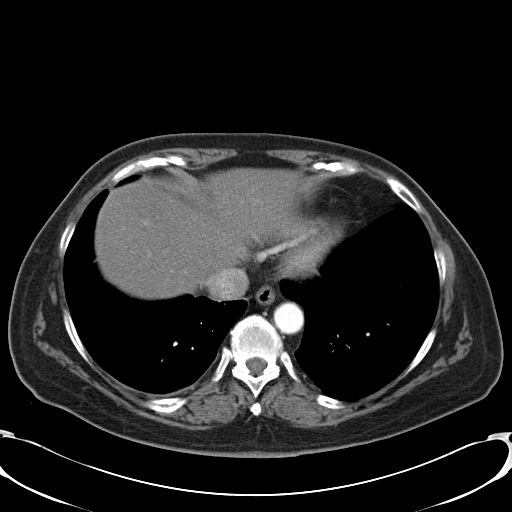
[im 87/124  soft-tissue]
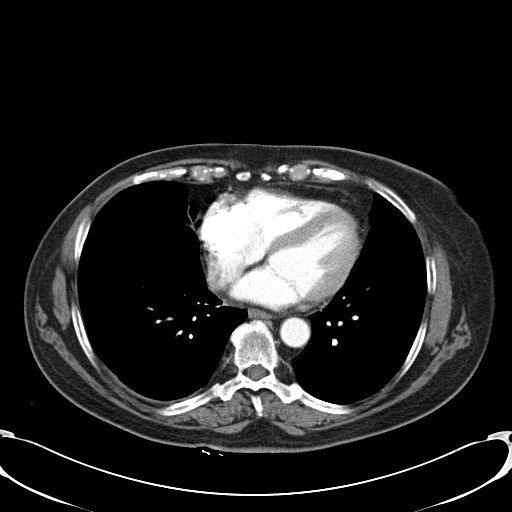
[im 87/124  bone]
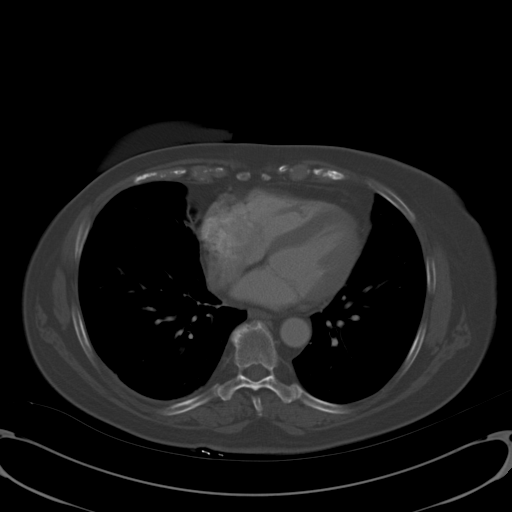
[im 95/124  soft-tissue]
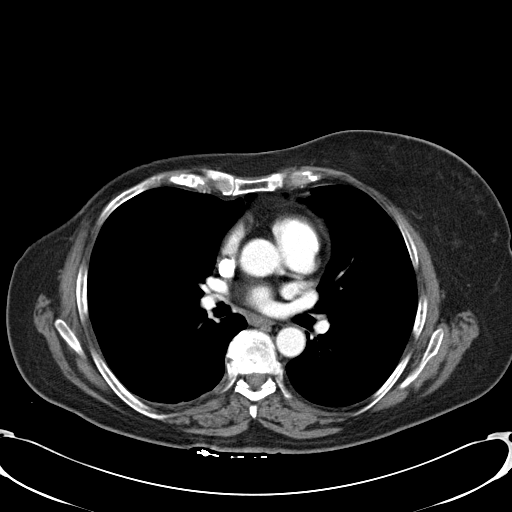
[im 109/124  soft-tissue]
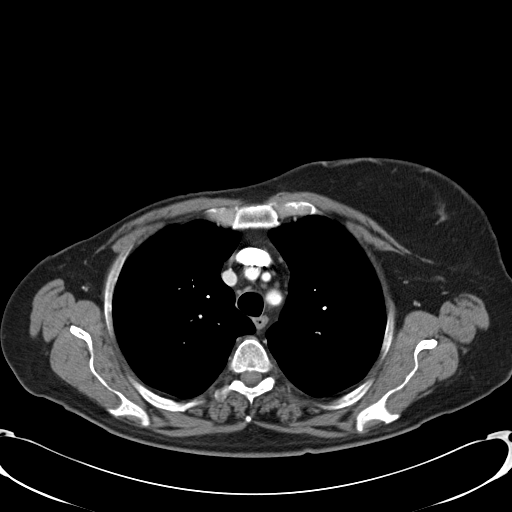
[im 116/124  soft-tissue]
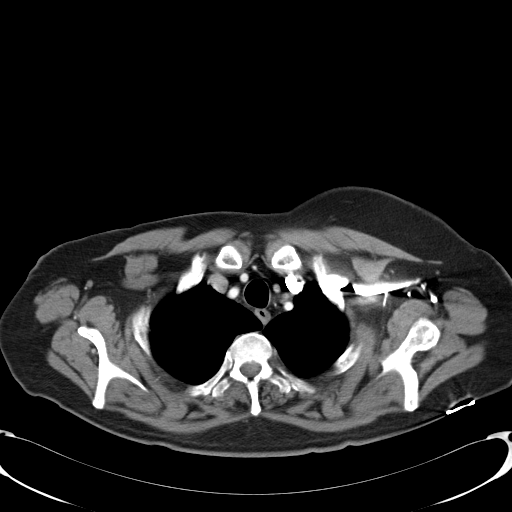

[Series 602: cor · coronal · 1.21mm/px · 3 of 85 slices shown]
[im 29/85  soft-tissue]
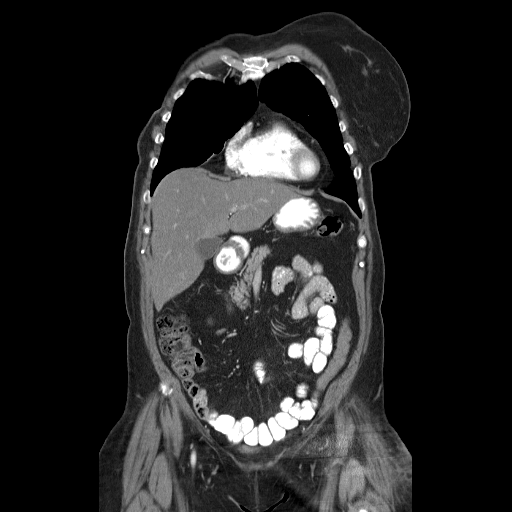
[im 38/85  soft-tissue]
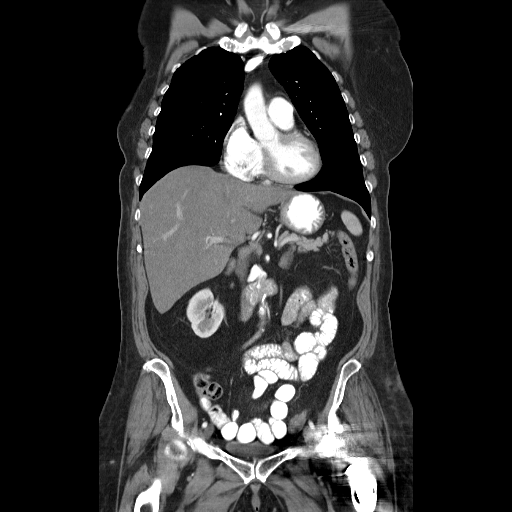
[im 47/85  soft-tissue]
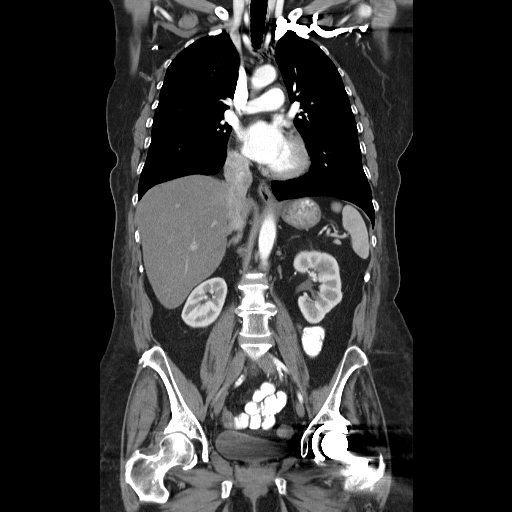

[15 of 46 positions shown; findings below may reference images not displayed]

FINDINGS: RECIST

Target Lesions:

1. Right lower lobe 5 mm pulmonary nodule on image 37/series 4,
similar.
2. 6 mm subcarinal node on image 28/series 2, similar.
Non-target Lesions:

1. Right-sided pleural thickening and trace fluid, similar.
CT CHEST FINDINGS

Mediastinum/Nodes: No supraclavicular adenopathy. right axillary
node dissection with mastectomy. No axillary adenopathy. Normal
heart size, without pericardial effusion. Mild lipomatous
hypertrophy of the interatrial septum. No central pulmonary
embolism, on this non-dedicated study. No mediastinal or hilar
adenopathy. No internal mammary adenopathy.

Lungs/Pleura: Trace right-sided pleural thickening, similar. Minimal
right pleural fluid inferiorly and medially, similar. Probable
secretions in the dependent trachea.

Subpleural right lower lobe 5 mm nodule is unchanged on image 37/
series 4. Clear left lung.

Musculoskeletal: Sclerosis involving the posterior medial left tenth
rib is unchanged. There is also subtle sclerosis involving the
lateral aspect of the right second rib.

CT ABDOMEN PELVIS FINDINGS

Hepatobiliary: Moderate hepatic steatosis with sparing adjacent the
gallbladder. Vague enhancement in the right lobe of the liver
measures 11 mm on image 52/series 2 and is similar, favoring a
perfusion anomaly. This is present back to 07/12/2014 at least.

Soft tissue density at the gallbladder fundus measures 8 mm on image
68/series 2 and is not readily apparent on the prior. upper normal
sized common duct, 7 mm.

Pancreas: Mild pancreatic atrophy, without duct dilatation.
Periampullary duodenal diverticula noted.

Spleen: Normal in size, without focal abnormality.

Adrenals/Urinary Tract: Normal right adrenal gland. A left adrenal
nodule is similar at 10 mm. Degraded evaluation of the pelvis,
secondary to beam hardening artifact from left hip arthroplasty.
Grossly normal urinary bladder.

Stomach/Bowel: Normal stomach, without wall thickening. Normal
colon, appendix, and terminal ileum. Otherwise normal small bowel.

Vascular/Lymphatic: Aortic and branch vessel atherosclerosis. No
abdominal and no gross pelvic adenopathy.

Reproductive: Normal uterus and adnexa.

Other: No significant free fluid. No evidence of omental or
peritoneal disease.

Musculoskeletal: Left hip arthroplasty. Similar sclerotic lesions
involving the left iliac wing and left side of the sacrum.
IMPRESSION: 1. Similar sclerotic lesions, likely related to treated osseous
metastasis.
2. No findings to suggest extra osseous metastatic disease.
3. Similar tiny right lower lobe pulmonary nodule.
4. Hepatic steatosis.
5. Similar left adrenal nodule, likely an adenoma.
6. Right hepatic lobe focus of hyper enhancement is similar back to
2230 and likely a perfusion anomaly.
7. Suspicion of gallbladder adenomyomatosis. Recommend attention on
follow-up.

## 2017-04-17 ENCOUNTER — Other Ambulatory Visit: Payer: Self-pay | Admitting: Oncology

## 2017-04-17 ENCOUNTER — Encounter: Payer: Self-pay | Admitting: *Deleted

## 2017-04-17 ENCOUNTER — Other Ambulatory Visit (HOSPITAL_BASED_OUTPATIENT_CLINIC_OR_DEPARTMENT_OTHER): Payer: Medicare Other

## 2017-04-17 ENCOUNTER — Ambulatory Visit (HOSPITAL_BASED_OUTPATIENT_CLINIC_OR_DEPARTMENT_OTHER): Payer: Medicare Other | Admitting: Adult Health

## 2017-04-17 ENCOUNTER — Telehealth: Payer: Self-pay | Admitting: Adult Health

## 2017-04-17 VITALS — BP 136/76 | HR 114 | Temp 98.3°F | Resp 20 | Ht 64.0 in | Wt 154.0 lb

## 2017-04-17 DIAGNOSIS — C50911 Malignant neoplasm of unspecified site of right female breast: Secondary | ICD-10-CM | POA: Diagnosis not present

## 2017-04-17 DIAGNOSIS — M8580 Other specified disorders of bone density and structure, unspecified site: Secondary | ICD-10-CM | POA: Diagnosis not present

## 2017-04-17 DIAGNOSIS — C7800 Secondary malignant neoplasm of unspecified lung: Secondary | ICD-10-CM

## 2017-04-17 DIAGNOSIS — Z006 Encounter for examination for normal comparison and control in clinical research program: Secondary | ICD-10-CM

## 2017-04-17 DIAGNOSIS — C801 Malignant (primary) neoplasm, unspecified: Secondary | ICD-10-CM | POA: Diagnosis not present

## 2017-04-17 DIAGNOSIS — Z79811 Long term (current) use of aromatase inhibitors: Secondary | ICD-10-CM | POA: Diagnosis not present

## 2017-04-17 DIAGNOSIS — C50411 Malignant neoplasm of upper-outer quadrant of right female breast: Secondary | ICD-10-CM | POA: Diagnosis not present

## 2017-04-17 DIAGNOSIS — Z13220 Encounter for screening for lipoid disorders: Secondary | ICD-10-CM

## 2017-04-17 DIAGNOSIS — C773 Secondary and unspecified malignant neoplasm of axilla and upper limb lymph nodes: Secondary | ICD-10-CM

## 2017-04-17 DIAGNOSIS — I3131 Malignant pericardial effusion in diseases classified elsewhere: Secondary | ICD-10-CM

## 2017-04-17 DIAGNOSIS — I318 Other specified diseases of pericardium: Secondary | ICD-10-CM | POA: Diagnosis not present

## 2017-04-17 DIAGNOSIS — I313 Pericardial effusion (noninflammatory): Secondary | ICD-10-CM

## 2017-04-17 DIAGNOSIS — Z170 Estrogen receptor positive status [ER+]: Secondary | ICD-10-CM | POA: Diagnosis not present

## 2017-04-17 DIAGNOSIS — K760 Fatty (change of) liver, not elsewhere classified: Secondary | ICD-10-CM | POA: Diagnosis not present

## 2017-04-17 DIAGNOSIS — E7800 Pure hypercholesterolemia, unspecified: Secondary | ICD-10-CM

## 2017-04-17 LAB — COMPREHENSIVE METABOLIC PANEL
ALT: 49 U/L (ref 0–55)
AST: 78 U/L — ABNORMAL HIGH (ref 5–34)
Albumin: 3.4 g/dL — ABNORMAL LOW (ref 3.5–5.0)
Alkaline Phosphatase: 302 U/L — ABNORMAL HIGH (ref 40–150)
Anion Gap: 12 mEq/L — ABNORMAL HIGH (ref 3–11)
BILIRUBIN TOTAL: 0.45 mg/dL (ref 0.20–1.20)
BUN: 17.7 mg/dL (ref 7.0–26.0)
CHLORIDE: 105 meq/L (ref 98–109)
CO2: 21 mEq/L — ABNORMAL LOW (ref 22–29)
Calcium: 11.2 mg/dL — ABNORMAL HIGH (ref 8.4–10.4)
Creatinine: 1.1 mg/dL (ref 0.6–1.1)
EGFR: 54 mL/min/{1.73_m2} — AB (ref >90)
GLUCOSE: 227 mg/dL — AB (ref 70–140)
POTASSIUM: 4.7 meq/L (ref 3.5–5.1)
SODIUM: 137 meq/L (ref 136–145)
TOTAL PROTEIN: 8.7 g/dL — AB (ref 6.4–8.3)

## 2017-04-17 LAB — CBC WITH DIFFERENTIAL/PLATELET
BASO%: 0.5 % (ref 0.0–2.0)
BASOS ABS: 0 10*3/uL (ref 0.0–0.1)
EOS ABS: 0.1 10*3/uL (ref 0.0–0.5)
EOS%: 1.4 % (ref 0.0–7.0)
HCT: 45 % (ref 34.8–46.6)
HGB: 14.7 g/dL (ref 11.6–15.9)
LYMPH%: 13.8 % — AB (ref 14.0–49.7)
MCH: 26.4 pg (ref 25.1–34.0)
MCHC: 32.7 g/dL (ref 31.5–36.0)
MCV: 80.8 fL (ref 79.5–101.0)
MONO#: 0.9 10*3/uL (ref 0.1–0.9)
MONO%: 10.6 % (ref 0.0–14.0)
NEUT#: 6.5 10*3/uL (ref 1.5–6.5)
NEUT%: 73.7 % (ref 38.4–76.8)
PLATELETS: 326 10*3/uL (ref 145–400)
RBC: 5.57 10*6/uL — ABNORMAL HIGH (ref 3.70–5.45)
RDW: 14.4 % (ref 11.2–14.5)
WBC: 8.8 10*3/uL (ref 3.9–10.3)
lymph#: 1.2 10*3/uL (ref 0.9–3.3)
nRBC: 0 % (ref 0–0)

## 2017-04-17 MED ORDER — INV-EVEROLIMUS (RAD0001) 5MG TABLET NOVARTIS CRAD001Y24135: 2.0000 | ORAL_TABLET | Freq: Every day | ORAL | 0 refills | Status: DC

## 2017-04-17 NOTE — Progress Notes (Signed)
[place]  [phone] Fax:(336) [phone]     ID: Barbara Parks DOB: 1951-01-07  MR#: [id number]  XBM#:[id number]  Patient Care Team: Darcus Austin, MD as PCP - General (Family Medicine) Jerline Pain, MD (Cardiology) Gaye Pollack, MD (Cardiothoracic Surgery) Magrinat, Virgie Dad, MD as Consulting Physician (Oncology) OTHER MD: Dr Erroll Luna- Merlene Laughter, DDS; Gaynelle Arabian MD  CHIEF COMPLAINT: metastatic breast cancer, on BOLERO study  CURRENT TREATMENT: letrozole + everolimus  BREAST CANCER HISTORY: From Dr. Collier [name]'s original intake note 04/13/2004:  "This woman has been in good health all of her life. She recently moved from [place] to work here.  She palpated a mass at about the 12 o'clock position in mid-July. She has not noticed any nipple retraction or skin changes.  She was seen by her primary care doctor who subsequently referred her for a mammogram.  Mammogram was performed on 03/01/04. This demonstrated a spiculated 2 cm mass at the 12 o'clock position in the right breast.  Upper outer quadrant of the left breast shows some distortion.   Physical exam at that time showed a firm, nontender nodule at the 12 o'clock position in the right breast, 5 cm from the nipple.  Ultrasound of this area showed a hypoechoic ill-defined mass, measuring 2.2 x 1.3 x 1.4 cm.  Physical exam of the left breast showed general vague thickening, upper outer quadrant of the left breast with a discrete palpable mass. The ultrasound performed showed a single hypoechoic ill-defined nodule at the 1 o'clock position, measuring 7 x 5 x 6 mm.  She had biopsies of both lesions on 03/02/03.  Needle core biopsy of the lesion on the right breast revealed invasive mammary carcinoma.  Needle core biopsy of the left breast showed a complex fibroadenoma.  Prognostic panel of the lesion on the right breast showed it to be ER positive at 73%, PR positive at 90% and proliferative index 9%,  HER-2 was 1+.  Patient was referred to Dr. Annamaria Boots, who performed a simple mastectomy with sentinel lymph node evaluation on 03/22/04.  Final pathology showed this to be an invasive ductal carcinoma  with lobular features, measuring 2.2 cm, grade 2 of 3.  Margins negative for carcinoma.  Invasive ductal carcinoma was extended to involve deep dermis of the nipple.  Lymphovascular invasion was identified.  Total of 4 sentinel lymph nodes were evaluated.  Touch imprints at the time of the OR was felt to be negative.  Subsequent evaluation showed a 5 mm focus of metastatic carcinoma in one of the four lymph nodes on microscopic after sectioning.  There was extracapsular extension  of one lymph node as well. The remaining three lymph nodes were all negative."  The patient's subsequent history is detailed below.  INTERVAL HISTORY: Barbara Parks returns today for follow-up and treatment of her estrogen receptor positive metastatic breast cancer. She continues on the [facility] trial this being her extension visit #7. This means that in addition to letrozole, she takes everolimus, 10 mg daily.  She is doing very well and continues to tolerate her medication well.  She gets scans every 6 months.    REVIEW OF SYSTEMS: She has some mild fatigue, and occasional nausea that is well managed.  She does have some mild heartburn as well.  She denies any other challenges and a detailed ROS is otherwise non contributory.   PAST MEDICAL HISTORY: Past Medical History:  Diagnosis Date  . Arthritis    left hip  .  Asthma   . breast ca 2005   breast/chemo R mastectomy  . GERD (gastroesophageal reflux disease)   . Hypercholesteremia   . Metastasis to lung ([place]) dx'd 08/2011  . Osteopenia due to cancer therapy 09/09/2013  . Palpitations   . Shortness of breath     PAST SURGICAL HISTORY: Past Surgical History:  Procedure Laterality Date  . BREAST SURGERY  2005   right  . CATARACT EXTRACTION W/PHACO Left 01/18/2014   Procedure:  CATARACT EXTRACTION PHACO AND INTRAOCULAR LENS PLACEMENT (IOC);  Surgeon: Elta Guadeloupe T. Gershon Crane, MD;  Location: [facility];  Service: Ophthalmology;  Laterality: Left;  CDE 15.79  . CATARACT EXTRACTION W/PHACO Right 02/08/2014   Procedure: CATARACT EXTRACTION PHACO AND INTRAOCULAR LENS PLACEMENT (IOC);  Surgeon: Elta Guadeloupe T. Gershon Crane, MD;  Location: [facility];  Service: Ophthalmology;  Laterality: Right;  CDE 4.41  . CHEST TUBE INSERTION  09/11/2011   Procedure: INSERTION PLEURAL DRAINAGE CATHETER;  Surgeon: Gaye Pollack, MD;  Location: [place];  Service: Thoracic;  Laterality: Right;  . PERICARDIAL WINDOW  09/11/2011   Procedure: PERICARDIAL WINDOW;  Surgeon: Gaye Pollack, MD;  Location[facility];  Service: Thoracic;  Laterality: N/A;  . REMOVAL OF PLEURAL DRAINAGE CATHETER  12/19/2011   Procedure: REMOVAL OF PLEURAL DRAINAGE CATHETER;  Surgeon: Gaye Pollack, MD;  Location: [place];  Service: Thoracic;  Laterality: Right;  TO BE DONE IN MINOR ROOM, SHORT STAY  . [place]  . TOTAL HIP ARTHROPLASTY Left 06/07/2015   Procedure: LEFT TOTAL HIP ARTHROPLASTY ANTERIOR APPROACH;  Surgeon: Gaynelle Arabian, MD;  Location: [facility];  Service: Orthopedics;  Laterality: Left;  Marland Kitchen VIDEO BRONCHOSCOPY  09/11/2011   Procedure: VIDEO BRONCHOSCOPY;  Surgeon: Gaye Pollack, MD;  Location: [facility] OR;  Service: Thoracic;  Laterality: N/A;    FAMILY HISTORY Family History  Problem Relation Age of Onset  . Cancer Mother        breast  . Heart disease Father   . Anesthesia problems Neg Hx    the patient's mother was diagnosed with breast cancer at age 73, she died age 68 from congestive heart failure..The patient's father died from heart disease at age 12.  She has one sister alive & well.  Two brothers alive & well, one with diabetes. The patient's sister was also diagnosed with breast cancer, and was tested for the BRCA gene, and was negative. The patient herself has not been tested. There is no history of ovarian cancer in the  family  GYNECOLOGIC HISTORY:  No LMP recorded. Patient is postmenopausal. Menarche age 5, the patient is GX P0. She stopped having periods with chemotherapy in 2005. 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 She was a Archivist. She is single, lives alone with her Shitzu-poodle Barbara Parks.. Family is all in the [place] area.     ADVANCED DIRECTIVES: Not in place. At the 02/23/2014 visit the patient was given the appropriate documents to complete and notarize at her discretion. She tells me she is planning to name her sister, Barbara Parks, as healthcare power of attorney. Barbara Parks can be reached at [phone]   HEALTH MAINTENANCE: Social History  Substance Use Topics  . Smoking status: Former Smoker    Packs/day: 1.50    Years: 30.00    Types: Cigarettes    Quit date: 09/10/2007  . Smokeless tobacco: Not on file  .  Alcohol use No     Colonoscopy:  PAP:  Bone density: 08/14/2015, T score -1.6  Lipid panel:  Allergies  Allergen Reactions  . Aspirin     REACTION: upset stomach  . Latex Other (See Comments)    Blistering and skin peels off  . Nsaids Nausea And Vomiting    Extreme nausea and vomiting    Current Outpatient Prescriptions  Medication Sig Dispense Refill  . cholecalciferol (VITAMIN D) 1000 units tablet Take 1,000 Units by mouth 2 (two) times daily.    Marland Kitchen gemfibrozil (LOPID) 600 MG tablet TAKE 1 TABLET BY MOUTH TWICE DAILY BEFORE MEAL(S) 180 tablet 0  . Investigational everolimus (RAD001) 5 MG tablet Novartis [id number] Take 2 tablets by mouth daily. Take with a glass of water. 168 tablet 0  . letrozole (FEMARA) 2.5 MG tablet TAKE 1 TABLET (2.5 MG TOTAL) BY MOUTH DAILY. 90 tablet 3  . loperamide (IMODIUM) 2 MG capsule Take 2 mg by mouth 4 (four) times daily as needed for diarrhea or loose stools. Reported on 11/30/2015    . metoprolol tartrate  (LOPRESSOR) 25 MG tablet Take 0.5 tablets (12.5 mg total) by mouth daily. 30 tablet 4  . omeprazole (PRILOSEC) 40 MG capsule Take 1 capsule (40 mg total) by mouth daily as needed. 90 capsule 3  . ondansetron (ZOFRAN) 8 MG tablet Take 1 tablet (8 mg total) by mouth 2 (two) times daily as needed (Nausea or vomiting). (Patient not taking: Reported on 09/20/2015) 30 tablet 1  . prochlorperazine (COMPAZINE) 25 MG suppository Place 1 suppository (25 mg total) rectally every 12 (twelve) hours as needed for nausea. (Patient not taking: Reported on 09/20/2015) 12 suppository 3   No current facility-administered medications for this visit.     OBJECTIVE:   Vitals:   04/17/17 1337  BP: 136/76  Pulse: (!) 114  Resp: 20  Temp: 98.3 F (36.8 C)  SpO2: 100%     Body mass index is 26.43 kg/m.    ECOG FS:1 - Symptomatic but completely ambulatory  GENERAL: Patient is a well appearing female in no acute distress HEENT:  Sclerae anicteric.  Oropharynx clear and moist. No ulcerations or evidence of oropharyngeal candidiasis. Neck is supple.  NODES:  No cervical, supraclavicular, or axillary lymphadenopathy palpated.  BREAST EXAM:  Deferred. LUNGS:  Clear to auscultation bilaterally.  No wheezes or rhonchi. HEART:  Regular rate and rhythm. No murmur appreciated. ABDOMEN:  Soft, nontender.  Positive, normoactive bowel sounds. No organomegaly palpated. MSK:  No focal spinal tenderness to palpation. Full range of motion bilaterally in the upper extremities. EXTREMITIES:  No peripheral edema.   SKIN:  Clear with no obvious rashes or skin changes. No nail dyscrasia. NEURO:  Nonfocal. Well oriented.  Appropriate affect.     LAB  RESULTS:  CMP     Component Value Date/Time   NA 140 01/21/2017 1029   K 4.3 01/21/2017 1029   CL 106 09/20/2015 1257   CL 109 (H) 12/31/2012 0940   CO2 21 (L) 01/21/2017 1029   GLUCOSE 185 (H) 01/21/2017 1029   GLUCOSE 127 (H) 12/31/2012 0940   BUN 19.2 01/21/2017 1029    CREATININE 1.0 01/21/2017 1029   CALCIUM 10.7 (H) 01/21/2017 1029   PROT 8.4 (H) 01/21/2017 1029   ALBUMIN 3.5 01/21/2017 1029   AST 67 (H) 01/21/2017 1029   ALT 45 01/21/2017 1029   ALKPHOS 266 (H) 01/21/2017 1029   BILITOT 0.36 01/21/2017 1029   GFRNONAA >60 09/20/2015 1257  GFRAA >60 09/20/2015 1257    I No results found for: SPEP  Lab Results  Component Value Date   WBC 8.8 04/17/2017   NEUTROABS 6.5 04/17/2017   HGB 14.7 04/17/2017   HCT 45.0 04/17/2017   MCV 80.8 04/17/2017   PLT 326 04/17/2017      Chemistry      Component Value Date/Time   NA 140 01/21/2017 1029   K 4.3 01/21/2017 1029   CL 106 09/20/2015 1257   CL 109 (H) 12/31/2012 0940   CO2 21 (L) 01/21/2017 1029   BUN 19.2 01/21/2017 1029   CREATININE 1.0 01/21/2017 1029      Component Value Date/Time   CALCIUM 10.7 (H) 01/21/2017 1029   ALKPHOS 266 (H) 01/21/2017 1029   AST 67 (H) 01/21/2017 1029   ALT 45 01/21/2017 1029   BILITOT 0.36 01/21/2017 1029       Lab Results  Component Value Date   LABCA2 58 (H) 09/14/2012    No components found for: [id number]  No results for input(s): INR in the last 168 hours.  Urinalysis    Component Value Date/Time   COLORURINE YELLOW 09/20/2015 1432   APPEARANCEUR HAZY (A) 09/20/2015 1432   LABSPEC >1.030 (H) 09/20/2015 1432   LABSPEC 1.030 07/13/2015 0841   PHURINE 5.5 09/20/2015 1432   GLUCOSEU 500 (A) 09/20/2015 1432   GLUCOSEU Negative 07/13/2015 0841   HGBUR MODERATE (A) 09/20/2015 1432   BILIRUBINUR NEGATIVE 09/20/2015 1432   BILIRUBINUR Negative 07/13/2015 0841   KETONESUR NEGATIVE 09/20/2015 1432   PROTEINUR 30 (A) 09/20/2015 1432   UROBILINOGEN 0.2 07/13/2015 0841   NITRITE NEGATIVE 09/20/2015 1432   LEUKOCYTESUR NEGATIVE 09/20/2015 1432   LEUKOCYTESUR Negative 07/13/2015 0841    STUDIES: Mm Digital Screening Unilat L  Result Date: 03/24/2017 CLINICAL DATA:  Screening. EXAM: DIGITAL SCREENING UNILATERAL LEFT MAMMOGRAM WITH CAD  COMPARISON:  Previous exam(s). ACR Breast Density Category b: There are scattered areas of fibroglandular density. FINDINGS: The patient has had a right mastectomy. There are no findings suspicious for malignancy. Images were processed with CAD. IMPRESSION: No mammographic evidence of malignancy. A result letter of this screening mammogram will be mailed directly to the patient. RECOMMENDATION: Screening mammogram in one year.  (Code:[id number]-L-37M) BI-RADS CATEGORY  1: Negative. Electronically Signed   By: Dorise Bullion III M.D   On: 03/24/2017 12:36     ASSESSMENT: 65 y.o. Barbara, Parks woman with stage IV breast cancer, on BOLERO-4 trial  (1) status post right mastectomy and sentinel lymph node sampling 03/22/2004 for a right upper-outer quadrant pT2 pN1, stage IIB invasive ductal carcinoma with lobular features, grade 2, estrogen receptor and progesterone receptor positive, HER-2 negative, with an MIB-1 of 9% ([id number] and [id number])  (2) addition all right axillary lymph node sampling 05/07/2004 showed 2 benign lymph nodes (4 lymph nodes previously removed, so total was one positive lymph node out of 6; [id number])  (3) the patient was evaluated by radiation oncology; no postmastectomy radiation recommended  (4) adjuvant chemotherapy with dose dense doxorubicin and cyclophosphamide x4 cycles (first cycle delayed one week) followed by dose dense paclitaxel x4 was completed 09/18/2004  (5) tamoxifen started March 2006, discontinued 2009  METASTATIC DISEASE: February 2013 (6) presenting with a large pericardial effusion, large right pleural effusion and possible right middle lobe bronchial obstruction February 2013, status post pericardial window placement, fiberoptic bronchoscopy and right Pleurx placement 09/11/2011, with biopsy of the bronchus intermedius and pericardium positive for metastatic breast cancer, estrogen receptor 91% positive  with moderate staining intensity, progesterone receptor 100%  positive with strong staining intensity, with an MIB-1 of 35%, and no HER-2 amplification, the signals ratio being 1.37 (SZA 13-721)  (7) enrolled in BOLERO-4 trial 10/10/2011, receiving letrozole and everolimus  (a) two small areas of enhancement in the cerebellum noted by brain MRI 10/03/2011 were no longer apparent on repeat MRI 08/21/2012-- most recent brain MRI 04/01/2014 showed no evidence of intracranial metastatic disease  (b) sclerotic lesions in left iliac bone and sacrum have not been biopsied; stable; plan is to start zolendronate after patient updates her dental care (extraction planned)  (c) RLL lung nodule, stable (rescanned 07/12/2014 and 08/11/2014) R hilar and subcarinal lymph nodes: stable  (9) additional problems:  (a) hepatic steatosis  (b) COPD/ emphysema/ asthma  (c) advanced L hip osteoarthritis, status post left total hip replacement 06/07/2015  (d) aortoiliac atherosclerosis  (e) dental evaluation pending w possible dental extractions  (f) possible thalassemia trait  (10) Bone density concerns: DEXA scan 08/11/2013 was normal  (11) likely thalassemia trait  (12) hepatic steatosis.  PLAN:  Barbara Parks is doing well today.  Her labs are stable and she will remain on Letrozole and Everolimus. She has no sign of progression and continues to tolerate the treatment very well.  She will need a lipid panel drawn prior to her next visit in 06/2017 (last done in 06/2016).  I reviewed her mammogram of her left breast that was done on 03/24/2017 that was normal.  She is also undergoing CT chest every 6 months and I ordered this for three months from now, just prior to her next appointment with Dr. Jana Hakim.  She did have her last bone density in 07/2015 that showed right femur osteopenia.  She knows about calcium, vitamin d and weight bearing exercises.    [place] will return in 3 months for labs and follow up with Dr. Jana Hakim.  She will have a CT chest done prior to this visit.    The  above plan was reviewed with [name] in detail.  She verbalized understanding of this.  She is without any questions or concerns, but knows to call us in the interim should she have any.    A total of (30) minutes of face-to-face time was spent with this patient with greater than 50% of that time in counseling and care-coordination.   Scot Dock, NP   04/17/2017 1:46 PM you to take care

## 2017-04-17 NOTE — Progress Notes (Signed)
04/17/17 at 2:55pm - Extension phase #7 visit-  The patient was into the clinic this afternoon unaccompanied for her routine 12-week follow-up visit and study drug dispensing. Patient returned 6 kits of everolimus with 6 tablets remaining (2 daily doses of 2 tablets each), showing 96.4% compliance for the time period 01/23/2017 - 04/16/2017. Patient returned paper calendar documenting daily doses of letrozole and everolimus with one daily dose missed on 04/14/17.  The pt said that she must have missed other days, but she could not remember the dates.   Medication was returned to pharmacy for drug accountability by pharmacist Raul Del. Patient reports the previously noted AE's as ongoing and unchanged in severity. Patient reports that she has noticed ongoing occasional blurry vision in her left eye, unchanged from previous report. She has not yet scheduled an appointment with her ophthalmologist for additional follow up. Based on lab results review and history and physical by Gardenia Phlegm, NP, patient condition is acceptable for continued treatment, and in her opinion she is still receiving benefit from her study treatment.  Six kits of study drug everolimus were dispensed today for the next 12 weeks supply. In addition, patient will continue commercial supply of letrozole 2.5mg  daily. The next study visit is expected to occur on 07/10/2017.  The pt said that she rescheduled her [place] trip for mid October.  The research nurse thanked the patient for her participation in this trial. Brion Aliment RN, BSN, CCRP Clinical Research Nurse 04/17/2017 4:52 PM   Adverse Event Log Protocol: Novartis [id number] Cycle: Extension Phase #6 (AE date range 01/24/17-04/17/17) Ongoing and unchanged in severity: fatigue, hot flashes (mild), memory impairment, dry skin, and back pain  Event Grade Onset Date Resolved Date Attribution to letrozole Attribution to everolimus  Blurred vision, left eye 1  06/28/2016 Ongoing Not suspected Not suspected  Alkaline phosphatase increased 1 08/14/2012 Ongoing Not suspected Suspected  AST increased 1 11/30/2015 Ongoing Not suspected Suspected  ALT increased 1 10/31/2016 01/21/2017 Not suspected Suspected  Albumin decreased 1 04/17/2017 Ongoing Not suspected Suspected  Hypercalcemia 1 01/21/2017 Ongoing Not suspected Not suspected  Heartburn 1 04/17/2017 Ongoing Not suspected Not suspected  Nausea 1 04/17/2017 Ongoing Not suspected Suspected

## 2017-04-17 NOTE — Telephone Encounter (Signed)
Spoke with patient regarding appts that were scheduled per 9/20 los

## 2017-04-18 ENCOUNTER — Encounter: Payer: Self-pay | Admitting: Adult Health

## 2017-04-18 LAB — CANCER ANTIGEN 27.29: CA 27.29: 72.3 U/mL — ABNORMAL HIGH (ref 0.0–38.6)

## 2017-05-01 ENCOUNTER — Other Ambulatory Visit: Payer: Self-pay | Admitting: *Deleted

## 2017-05-01 MED ORDER — GEMFIBROZIL 600 MG PO TABS: ORAL_TABLET | ORAL | 0 refills | Status: DC

## 2017-06-11 IMAGING — CT CT ABD-PELV W/ CM
2 of 5 series · 14 of 46 positions shown, 16 images · IV contrast (iopamidol)
Comparison: 10/03/2015

CLINICAL DATA: Right-sided breast cancer diagnosed in 1001 in 0868.
Right mastectomy. On oral chemotherapy. Lung metastasis.

EXAM:
CT CHEST, ABDOMEN, AND PELVIS WITH CONTRAST
TECHNIQUE: Multidetector CT imaging of the chest, abdomen and pelvis was
performed following the standard protocol during bolus
administration of intravenous contrast.
CONTRAST:  100mL [id number] IOPAMIDOL ([id number]) INJECTION 61%

[Series 2: cap with st · axial · 0.78mm/px · z∈[-712,-192]mm · 11 of 126 slices shown, 13 images]
[im 11/126  soft-tissue]
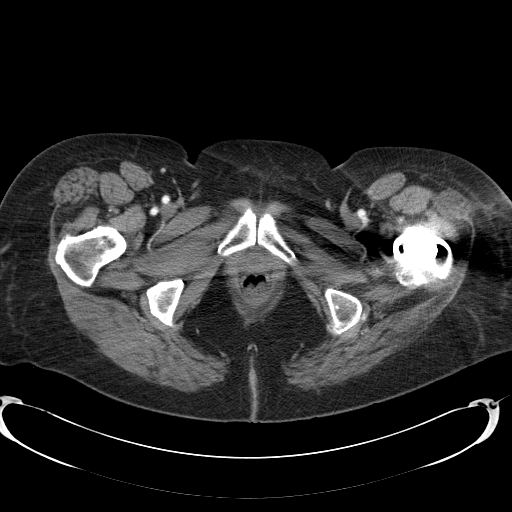
[im 11/126  bone]
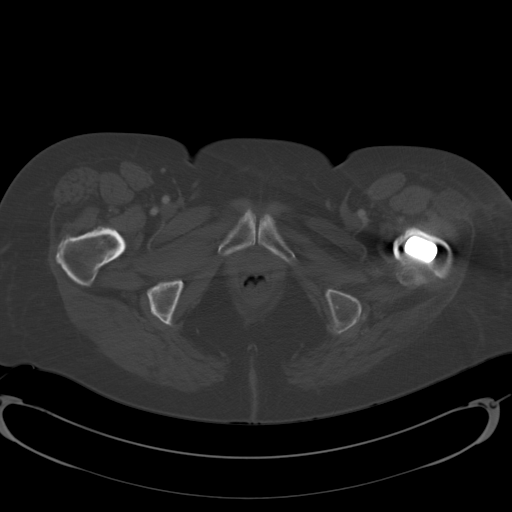
[im 21/126  soft-tissue]
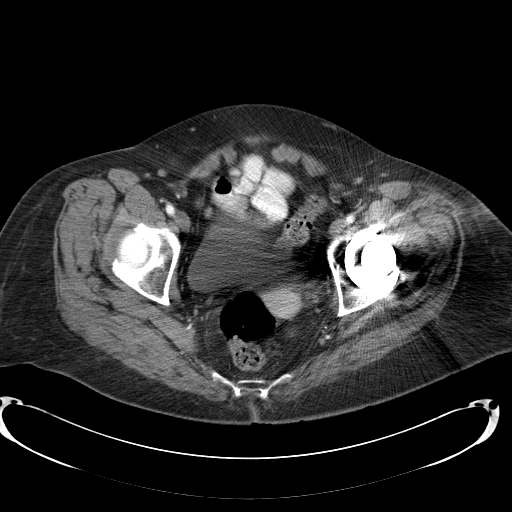
[im 32/126  soft-tissue]
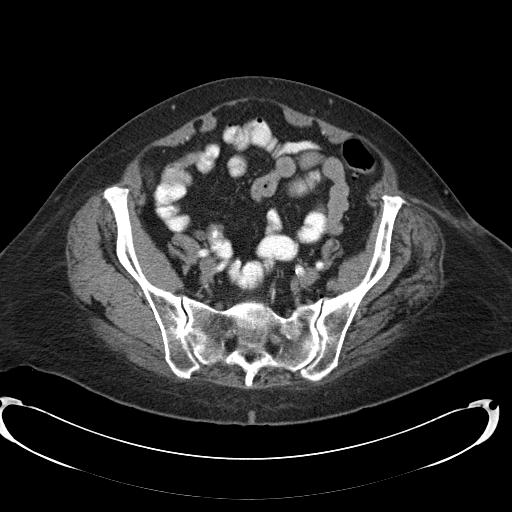
[im 42/126  soft-tissue]
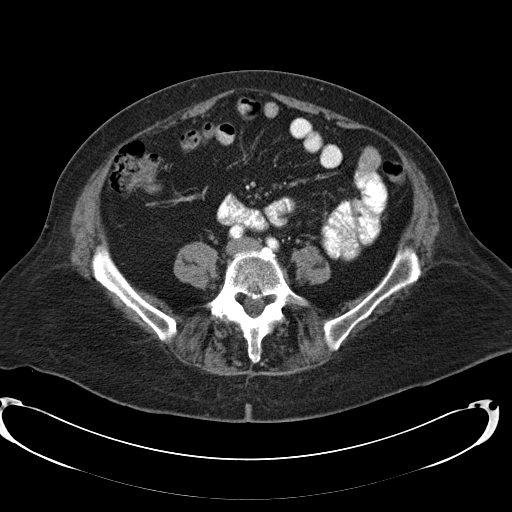
[im 53/126  soft-tissue]
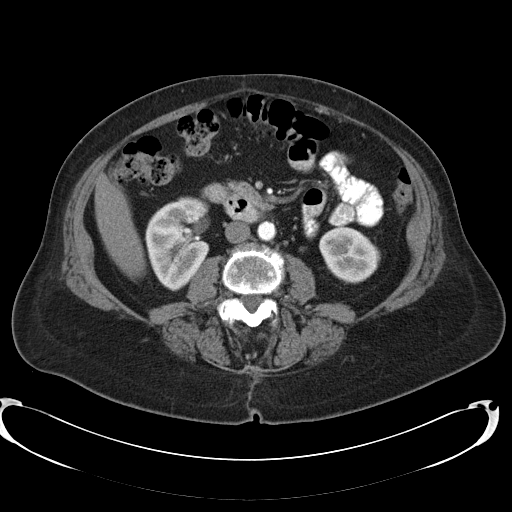
[im 63/126  soft-tissue]
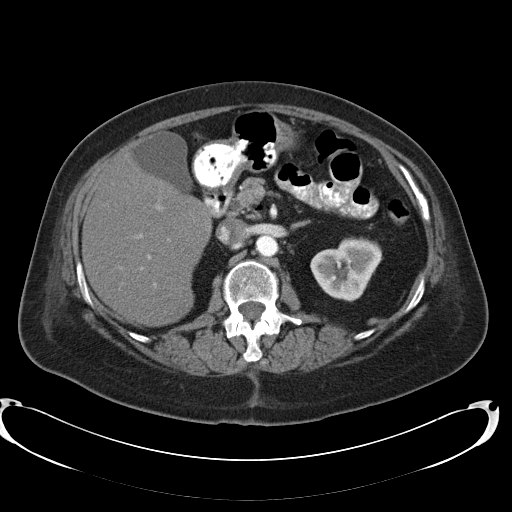
[im 73/126  soft-tissue]
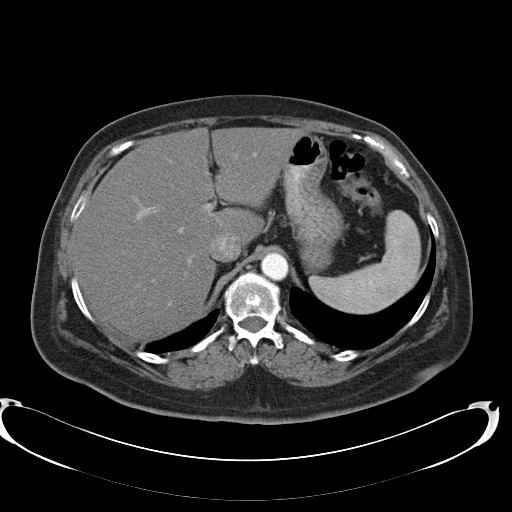
[im 84/126  soft-tissue]
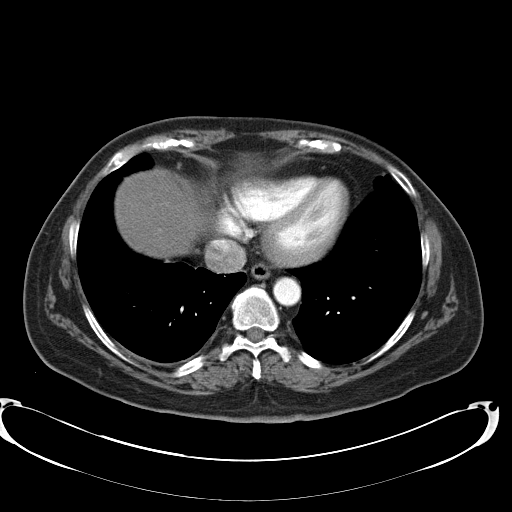
[im 94/126  soft-tissue]
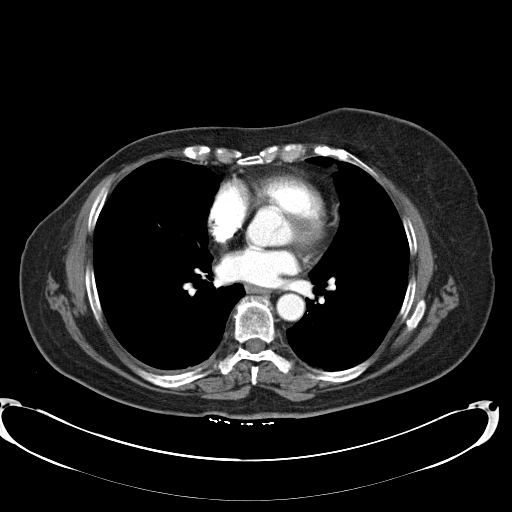
[im 94/126  bone]
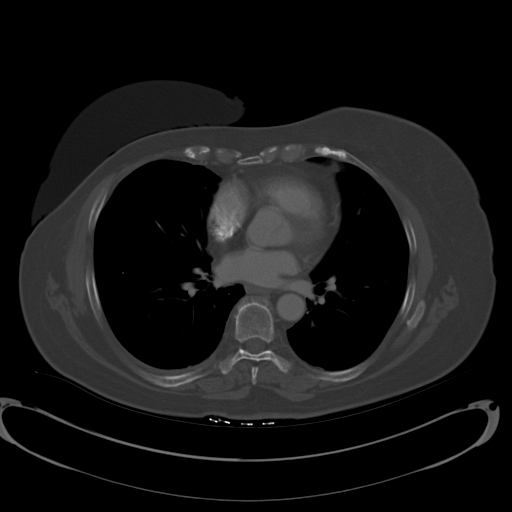
[im 105/126  soft-tissue]
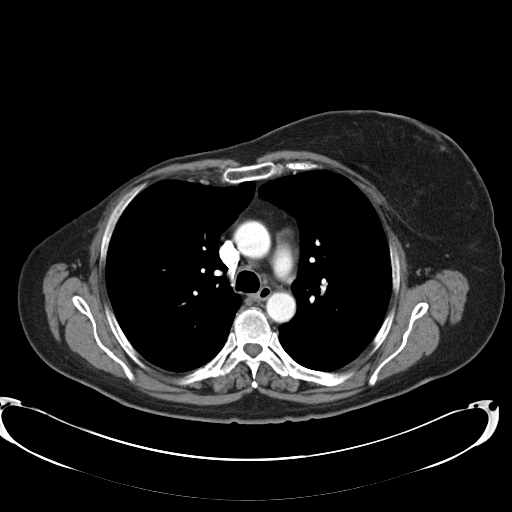
[im 115/126  soft-tissue]
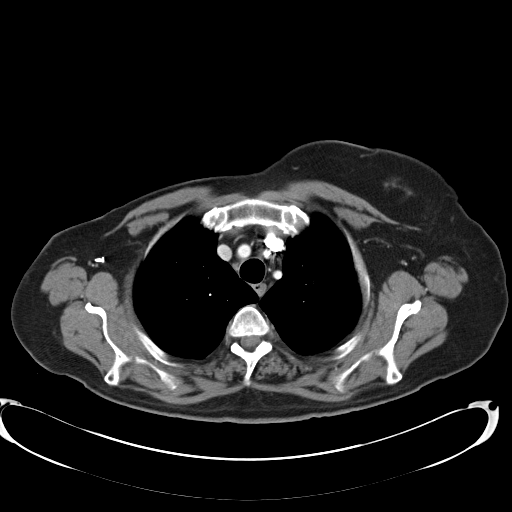

[Series 602: <mpr thick range> · coronal · 1.22mm/px · 3 of 146 slices shown]
[im 49/146  soft-tissue]
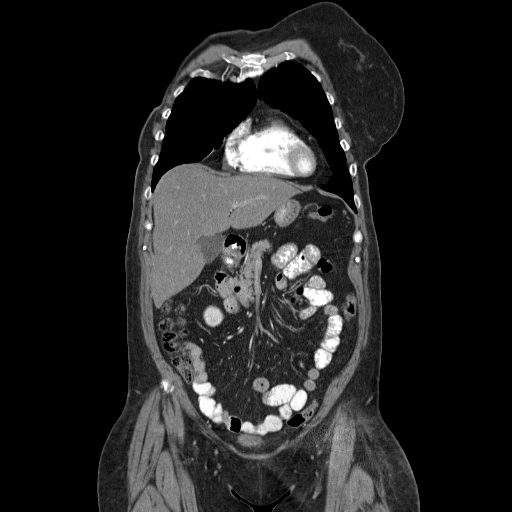
[im 65/146  soft-tissue]
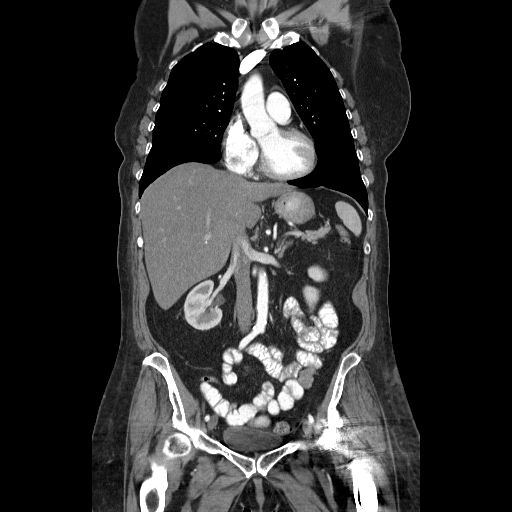
[im 81/146  soft-tissue]
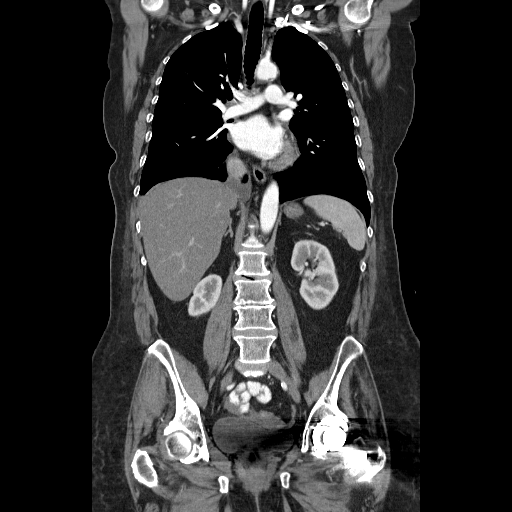

[14 of 46 positions shown; findings below may reference images not displayed]

FINDINGS: RECIST

Target Lesions:

1. Similar right lower lobe 5 mm pulmonary nodule, including on
image 95/series 4.
2. Subcarinal node which measures 6 mm on image 28/ series 2 and is
not significantly changed.
Non-target Lesions:

1. Similar right-sided pleural thickening and trace fluid.
CT CHEST FINDINGS

Mediastinum/Lymph Nodes: No supraclavicular adenopathy. Right
axillary node dissection and mastectomy. Minimal left breast
nodularity is unchanged. No left axillary adenopathy. Tortuous
thoracic aorta. Borderline cardiomegaly. No pericardial effusion. No
central pulmonary embolism, on this non-dedicated study. No
mediastinal or hilar adenopathy.

Lungs/Pleura: Trace right-sided pleural thickening and fluid,
similar. No pleural fluid. Mild centrilobular emphysema.

2 mm right upper lobe pulmonary nodule on image 49/series 4, similar
given differences in slice thickness. 5 mm right upper lobe
pulmonary nodule on image 95/series 4.

Musculoskeletal: Similar sclerosis involving the posterior medial
left tenth rib on image 48/series 2. Subtle right anterior second
rib sclerosis is also similar.

CT ABDOMEN PELVIS FINDINGS

Hepatobiliary: Moderate hepatic steatosis. Vague right hepatic lobe
area of hyper enhancement is similar, on the order of 11 mm on image
52/series 2. Soft tissue density within the gallbladder fundus
measures 8 mm on image 69/series 2. This is similar. Common duct
upper normal for patient age, including at 6 mm.

Pancreas: Normal, without mass or ductal dilatation. Periampullary
duodenal diverticulum.

Spleen: Normal in size, without focal abnormality.

Adrenals/Urinary Tract: Normal right adrenal gland. Left adrenal
nodule measures on the order of 12 mm on image 60/series 2. This is
similar (when remeasured). Smaller nodule was present back in 3348,
suggesting a benign etiology.

Normal kidneys, without hydronephrosis. Degraded evaluation of the
pelvis, secondary to beam hardening artifact from left hip
arthroplasty. Grossly normal urinary bladder.

Stomach/Bowel: Normal stomach, without wall thickening. Normal
colon, appendix, and terminal ileum. Normal small bowel.

Vascular/Lymphatic: Aortic and branch vessel atherosclerosis. No
abdominal and no gross pelvic adenopathy.

Reproductive: similar eccentric left position of the uterus. No
adnexal mass.

Other: No significant free fluid. No evidence of omental or
peritoneal disease.

Musculoskeletal: Left hip arthroplasty. Vague sclerosis involving
the left iliac wing and left side of the sacrum are similar.
IMPRESSION: 1. Similar sclerotic osseous metastasis.
2. No extraosseous metastasis identified within the chest abdomen or
pelvis.
3. Hepatic steatosis.
4. Similar left adrenal nodule, likely an adenoma.
5. Gallbladder soft tissue density within the fundus is suspicious
for polyp or adenomyomatosis.
6. Right hepatic lobe hyperenhancement likely represents a perfusion
anomaly and is unchanged.

## 2017-06-27 ENCOUNTER — Telehealth: Payer: Self-pay

## 2017-06-27 NOTE — Telephone Encounter (Signed)
Moved 12/13 labs to 12/11 Per Zillah as her 9/20 labs will be too old for scan purposes.  Lexine Baton will call the patient.

## 2017-07-08 ENCOUNTER — Ambulatory Visit (HOSPITAL_COMMUNITY)
Admission: RE | Admit: 2017-07-08 | Discharge: 2017-07-08 | Disposition: A | Discharge status: Home / Self Care | Payer: Medicare Other | Source: Ambulatory Visit | Attending: Adult Health | Admitting: Adult Health

## 2017-07-08 ENCOUNTER — Other Ambulatory Visit (HOSPITAL_BASED_OUTPATIENT_CLINIC_OR_DEPARTMENT_OTHER): Payer: Medicare Other

## 2017-07-08 ENCOUNTER — Encounter (HOSPITAL_COMMUNITY): Payer: Self-pay

## 2017-07-08 ENCOUNTER — Ambulatory Visit (HOSPITAL_COMMUNITY): Admission: RE | Admit: 2017-07-08 | Payer: Medicare Other | Source: Ambulatory Visit

## 2017-07-08 DIAGNOSIS — C50411 Malignant neoplasm of upper-outer quadrant of right female breast: Secondary | ICD-10-CM

## 2017-07-08 DIAGNOSIS — Z853 Personal history of malignant neoplasm of breast: Secondary | ICD-10-CM | POA: Diagnosis not present

## 2017-07-08 DIAGNOSIS — K760 Fatty (change of) liver, not elsewhere classified: Secondary | ICD-10-CM | POA: Diagnosis not present

## 2017-07-08 DIAGNOSIS — C7800 Secondary malignant neoplasm of unspecified lung: Secondary | ICD-10-CM

## 2017-07-08 DIAGNOSIS — I700 Atherosclerosis of aorta: Secondary | ICD-10-CM | POA: Diagnosis not present

## 2017-07-08 DIAGNOSIS — C50911 Malignant neoplasm of unspecified site of right female breast: Secondary | ICD-10-CM

## 2017-07-08 DIAGNOSIS — C801 Malignant (primary) neoplasm, unspecified: Secondary | ICD-10-CM

## 2017-07-08 DIAGNOSIS — Z9221 Personal history of antineoplastic chemotherapy: Secondary | ICD-10-CM | POA: Insufficient documentation

## 2017-07-08 DIAGNOSIS — I3131 Malignant pericardial effusion in diseases classified elsewhere: Secondary | ICD-10-CM

## 2017-07-08 DIAGNOSIS — Z170 Estrogen receptor positive status [ER+]: Principal | ICD-10-CM

## 2017-07-08 DIAGNOSIS — R918 Other nonspecific abnormal finding of lung field: Secondary | ICD-10-CM | POA: Diagnosis not present

## 2017-07-08 DIAGNOSIS — Z923 Personal history of irradiation: Secondary | ICD-10-CM | POA: Diagnosis not present

## 2017-07-08 DIAGNOSIS — I313 Pericardial effusion (noninflammatory): Secondary | ICD-10-CM

## 2017-07-08 DIAGNOSIS — E7800 Pure hypercholesterolemia, unspecified: Secondary | ICD-10-CM

## 2017-07-08 LAB — LIPID PANEL
CHOL/HDL RATIO: 5.2 ratio — AB (ref 0.0–4.4)
Cholesterol, Total: 270 mg/dL — ABNORMAL HIGH (ref 100–199)
HDL: 52 mg/dL (ref >39)
LDL Calculated: 166 mg/dL — ABNORMAL HIGH (ref 0–99)
Triglycerides: 258 mg/dL — ABNORMAL HIGH (ref 0–149)
VLDL CHOLESTEROL CAL: 52 mg/dL — AB (ref 5–40)

## 2017-07-08 LAB — CBC WITH DIFFERENTIAL/PLATELET
BASO%: 0.5 % (ref 0.0–2.0)
BASOS ABS: 0 10*3/uL (ref 0.0–0.1)
EOS ABS: 0.1 10*3/uL (ref 0.0–0.5)
EOS%: 1.2 % (ref 0.0–7.0)
HEMATOCRIT: 42.9 % (ref 34.8–46.6)
HGB: 14.1 g/dL (ref 11.6–15.9)
LYMPH%: 16.2 % (ref 14.0–49.7)
MCH: 26.7 pg (ref 25.1–34.0)
MCHC: 32.9 g/dL (ref 31.5–36.0)
MCV: 81.1 fL (ref 79.5–101.0)
MONO#: 0.7 10*3/uL (ref 0.1–0.9)
MONO%: 8.9 % (ref 0.0–14.0)
NEUT%: 73.2 % (ref 38.4–76.8)
NEUTROS ABS: 5.6 10*3/uL (ref 1.5–6.5)
PLATELETS: 283 10*3/uL (ref 145–400)
RBC: 5.29 10*6/uL (ref 3.70–5.45)
RDW: 14.6 % — ABNORMAL HIGH (ref 11.2–14.5)
WBC: 7.7 10*3/uL (ref 3.9–10.3)
lymph#: 1.2 10*3/uL (ref 0.9–3.3)
nRBC: 0 % (ref 0–0)

## 2017-07-08 LAB — COMPREHENSIVE METABOLIC PANEL
ALT: 39 U/L (ref 0–55)
AST: 54 U/L — AB (ref 5–34)
Albumin: 3.4 g/dL — ABNORMAL LOW (ref 3.5–5.0)
Alkaline Phosphatase: 232 U/L — ABNORMAL HIGH (ref 40–150)
Anion Gap: 13 mEq/L — ABNORMAL HIGH (ref 3–11)
BUN: 17.7 mg/dL (ref 7.0–26.0)
CHLORIDE: 108 meq/L (ref 98–109)
CO2: 18 meq/L — AB (ref 22–29)
CREATININE: 1 mg/dL (ref 0.6–1.1)
Calcium: 10.4 mg/dL (ref 8.4–10.4)
EGFR: >60 mL/min/{1.73_m2} (ref >60)
GLUCOSE: 156 mg/dL — AB (ref 70–140)
POTASSIUM: 3.8 meq/L (ref 3.5–5.1)
SODIUM: 139 meq/L (ref 136–145)
Total Bilirubin: 0.42 mg/dL (ref 0.20–1.20)
Total Protein: 7.7 g/dL (ref 6.4–8.3)

## 2017-07-08 MED ORDER — IOPAMIDOL (ISOVUE-300) INJECTION 61%
75.0000 mL | Freq: Once | INTRAVENOUS | Status: AC | PRN
  Administered 2017-07-08: 75 mL via INTRAVENOUS

## 2017-07-08 MED ORDER — IOPAMIDOL (ISOVUE-300) INJECTION 61%
INTRAVENOUS | Status: AC
  Filled 2017-07-08: qty 75

## 2017-07-08 NOTE — Progress Notes (Signed)
[facility]  [phone] [phone]     ID: Barbara Parks DOB: [birth year]  MR#: [id number]  GYK#:[id number]  Patient Care Team: Darcus Austin, MD as PCP - General (Family Medicine) Jerline Pain, MD (Cardiology) Gaye Pollack, MD (Cardiothoracic Surgery) Tripp Goins, Virgie Dad, MD as Consulting Physician (Oncology) OTHER MD: Dr Mitzie Na, DDS; Gaynelle Arabian MD  CHIEF COMPLAINT: metastatic breast cancer, on BOLERO study  CURRENT TREATMENT: letrozole + everolimus  BREAST CANCER HISTORY: From Dr. Collier [name]'s original intake note 04/13/2004:  "This woman has been in good health all of her life. She recently moved from [place] to work here.  She palpated a mass at about the 12 o'clock position in mid-July. She has not noticed any nipple retraction or skin changes.  She was seen by her primary care doctor who subsequently referred her for a mammogram.  Mammogram was performed on 03/01/04. This demonstrated a spiculated 2 cm mass at the 12 o'clock position in the right breast.  Upper outer quadrant of the left breast shows some distortion.   Physical exam at that time showed a firm, nontender nodule at the 12 o'clock position in the right breast, 5 cm from the nipple.  Ultrasound of this area showed a hypoechoic ill-defined mass, measuring 2.2 x 1.3 x 1.4 cm.  Physical exam of the left breast showed general vague thickening, upper outer quadrant of the left breast with a discrete palpable mass. The ultrasound performed showed a single hypoechoic ill-defined nodule at the 1 o'clock position, measuring 7 x 5 x 6 mm.  She had biopsies of both lesions on 03/02/03.  Needle core biopsy of the lesion on the right breast revealed invasive mammary carcinoma.  Needle core biopsy of the left breast showed a complex fibroadenoma.  Prognostic panel of the lesion on the right breast showed it to be ER positive at 73%, PR positive at 90% and proliferative index 9%,  HER-2 was 1+.  Patient was referred to Dr. Annamaria Boots, who performed a simple mastectomy with sentinel lymph node evaluation on 03/22/04.  Final pathology showed this to be an invasive ductal carcinoma  with lobular features, measuring 2.2 cm, grade 2 of 3.  Margins negative for carcinoma.  Invasive ductal carcinoma was extended to involve deep dermis of the nipple.  Lymphovascular invasion was identified.  Total of 4 sentinel lymph nodes were evaluated.  Touch imprints at the time of the OR was felt to be negative.  Subsequent evaluation showed a 5 mm focus of metastatic carcinoma in one of the four lymph nodes on microscopic after sectioning.  There was extracapsular extension  of one lymph node as well. The remaining three lymph nodes were all negative."  The patient's subsequent history is detailed below.  INTERVAL HISTORY: Barbara Parks returns today for follow-up and treatment of her metastatic estrogen receptor positive breast cancer. She continues on the [facility] trial this being her extension face visit #6. This means that in addition to letrozole, she takes everolimus, 5 mg daily. She tolerates both the letrozole and the everolimus well. She reports dry nails and skin. She uses moisturizer for her scalp. She denies having sores in her mouth.   She completed a chest CT on 07/08/2017 showing: New 7 x 13 mm nodule in the right lower lobe. This is nonspecific but is concerning for potential primary malignancy versus solitary metastatic lesion. The possibility of a benign area of scarring is also considered, but not strongly favored at this time.  Close attention on followup studies is recommended, with repeat chest CT recommended in 3 months at this time. Other previously described lesions are stable compared to prior examinations, as above. Stable mild right pleural thickening. Hepatic steatosis.   REVIEW OF SYSTEMS: Barbara Parks reports that she has been well. She denies having a major cough, although she may have a  small productive cough in the morning with clear phlegm. She recently traveled to the [place], [place]. She reports that she loves to take walks frequently. She notes that she was taking her medications on top of each other and she got sick. She notes that she is now being care with how she takes her medications. She denies unusual headaches, visual changes, nausea, vomiting, or dizziness. There has been no unusual cough, phlegm production, or pleurisy. This been no change in bowel or bladder habits. She denies unexplained fatigue or unexplained weight loss, bleeding, rash, or fever. A detailed review of systems was otherwise stable.    PAST MEDICAL HISTORY: Past Medical History:  Diagnosis Date  . Arthritis    left hip  . Asthma   . breast ca 2005   breast/chemo R mastectomy  . GERD (gastroesophageal reflux disease)   . Hypercholesteremia   . Metastasis to lung ([place]) dx'd 08/2011  . Osteopenia due to cancer therapy 09/09/2013  . Palpitations   . Shortness of breath     PAST SURGICAL HISTORY: Past Surgical History:  Procedure Laterality Date  . BREAST SURGERY  2005   right  . CATARACT EXTRACTION W/PHACO Left 01/18/2014   Procedure: CATARACT EXTRACTION PHACO AND INTRAOCULAR LENS PLACEMENT (IOC);  Surgeon: Elta Guadeloupe T. Gershon Crane, MD;  Location: [facility];  Service: Ophthalmology;  Laterality: Left;  CDE 15.79  . CATARACT EXTRACTION W/PHACO Right 02/08/2014   Procedure: CATARACT EXTRACTION PHACO AND INTRAOCULAR LENS PLACEMENT (IOC);  Surgeon: Elta Guadeloupe T. Gershon Crane, MD;  Location: [facility];  Service: Ophthalmology;  Laterality: Right;  CDE 4.41  . CHEST TUBE INSERTION  09/11/2011   Procedure: INSERTION PLEURAL DRAINAGE CATHETER;  Surgeon: Gaye Pollack, MD;  Location: [facility];  Service: Thoracic;  Laterality: Right;  . PERICARDIAL WINDOW  09/11/2011   Procedure: PERICARDIAL WINDOW;  Surgeon: Gaye Pollack, MD;  Location: [facility] OR;  Service: Thoracic;  Laterality: N/A;  . REMOVAL OF PLEURAL DRAINAGE CATHETER   12/19/2011   Procedure: REMOVAL OF PLEURAL DRAINAGE CATHETER;  Surgeon: Gaye Pollack, MD;  Location: [facility];  Service: Thoracic;  Laterality: Right;  TO BE DONE IN MINOR ROOM, SHORT STAY  . [place]  . TOTAL HIP ARTHROPLASTY Left 06/07/2015   Procedure: LEFT TOTAL HIP ARTHROPLASTY ANTERIOR APPROACH;  Surgeon: Gaynelle Arabian, MD;  Location: [facility];  Service: Orthopedics;  Laterality: Left;  Marland Kitchen VIDEO BRONCHOSCOPY  09/11/2011   Procedure: VIDEO BRONCHOSCOPY;  Surgeon: Gaye Pollack, MD;  Location: [facility] OR;  Service: Thoracic;  Laterality: N/A;    FAMILY HISTORY Family History  Problem Relation Age of Onset  . Cancer Mother        breast  . Heart disease Father   . Anesthesia problems Neg Hx    the patient's mother was diagnosed with breast cancer at age 50, she died age 27 from congestive heart failure..The patient's father died from heart disease at age 1.  She has one sister alive & well.  Two brothers alive & well, one with diabetes. The patient's sister was also diagnosed with breast cancer, and was tested for the BRCA gene, and  was negative. The patient herself has not been tested. There is no history of ovarian cancer in the family  GYNECOLOGIC HISTORY:  No LMP recorded. Patient is postmenopausal. Menarche age 79, the patient is GX P0. She stopped having periods with chemotherapy in 2005. 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 She was a Archivist. She is single, lives alone with her Shitzu-poodle Sammie.. Family is all in the [place] area.  Note her brother is currently under treatment as discussed today    ADVANCED DIRECTIVES: Not in place. At the 02/23/2014 visit the patient was given the appropriate documents to complete and notarize at her discretion. She tells me she is planning to name her sister, Billie Ruddy, as healthcare power of attorney. [name]  can be reached at [phone]   HEALTH MAINTENANCE: Social History   Tobacco Use  . Smoking status: Former Smoker    Packs/day: 1.50    Years: 30.00    Pack years: 45.00    Types: Cigarettes    Last attempt to quit: 09/10/2007    Years since quitting: 9.8  . Smokeless tobacco: Never Used  Substance Use Topics  . Alcohol use: No  . Drug use: No     Colonoscopy:  PAP:  Bone density: 08/14/2015, T score -1.6  Lipid panel:  Allergies  Allergen Reactions  . Aspirin     REACTION: upset stomach  . Latex Other (See Comments)    Blistering and skin peels off  . Nsaids Nausea And Vomiting    Extreme nausea and vomiting    Current Outpatient Medications  Medication Sig Dispense Refill  . cholecalciferol (VITAMIN D) 1000 units tablet Take 1,000 Units by mouth 2 (two) times daily.    Marland Kitchen gemfibrozil (LOPID) 600 MG tablet TAKE 1 TABLET BY MOUTH TWICE DAILY BEFORE MEAL(S) 180 tablet 0  . Investigational everolimus (RAD001) 5 MG tablet Novartis [id number] Take 2 tablets by mouth daily. Take with a glass of water. 168 tablet 0  . letrozole (FEMARA) 2.5 MG tablet TAKE 1 TABLET (2.5 MG TOTAL) BY MOUTH DAILY. 90 tablet 3  . loperamide (IMODIUM) 2 MG capsule Take 2 mg by mouth 4 (four) times daily as needed for diarrhea or loose stools. Reported on 11/30/2015    . metoprolol tartrate (LOPRESSOR) 25 MG tablet Take 0.5 tablets (12.5 mg total) by mouth daily. 30 tablet 4  . omeprazole (PRILOSEC) 40 MG capsule Take 1 capsule (40 mg total) by mouth daily as needed. 90 capsule 3  . ondansetron (ZOFRAN) 8 MG tablet Take 1 tablet (8 mg total) by mouth 2 (two) times daily as needed (Nausea or vomiting). 30 tablet 1  . prochlorperazine (COMPAZINE) 25 MG suppository Place 1 suppository (25 mg total) rectally every 12 (twelve) hours as needed for nausea. 12 suppository 3   No current facility-administered medications for this visit.     OBJECTIVE: Middle-aged white woman who appears stated  age  34:   07/10/17 1049 07/10/17 1050  BP: 117/72 128/65  Pulse: 90 91  Resp:    Temp:    SpO2:       Body mass index is 26.98 kg/m.    ECOG FS:1 - Symptomatic but completely ambulatory  Sclerae unicteric, pupils round and equal Oropharynx clear and moist No cervical or supraclavicular adenopathy Lungs no rales or rhonchi Heart regular rate and rhythm Abd soft, nontender, positive  bowel sounds MSK no focal spinal tenderness, no upper extremity lymphedema Neuro: nonfocal, well oriented, appropriate affect Breasts: The right breast is status post mastectomy.  There is no evidence of chest wall recurrence.  The left breast is unremarkable.  Both axillae are benign.  LAB  RESULTS:  CMP     Component Value Date/Time   NA 139 07/08/2017 0942   K 3.8 07/08/2017 0942   CL 106 09/20/2015 1257   CL 109 (H) 12/31/2012 0940   CO2 18 (L) 07/08/2017 0942   GLUCOSE 156 (H) 07/08/2017 0942   GLUCOSE 127 (H) 12/31/2012 0940   BUN 17.7 07/08/2017 0942   CREATININE 1.0 07/08/2017 0942   CALCIUM 10.4 07/08/2017 0942   PROT 7.7 07/08/2017 0942   ALBUMIN 3.4 (L) 07/08/2017 0942   AST 54 (H) 07/08/2017 0942   ALT 39 07/08/2017 0942   ALKPHOS 232 (H) 07/08/2017 0942   BILITOT 0.42 07/08/2017 0942   GFRNONAA >60 09/20/2015 1257   GFRAA >60 09/20/2015 1257    I No results found for: SPEP  Lab Results  Component Value Date   WBC 7.7 07/08/2017   NEUTROABS 5.6 07/08/2017   HGB 14.1 07/08/2017   HCT 42.9 07/08/2017   MCV 81.1 07/08/2017   PLT 283 07/08/2017      Chemistry      Component Value Date/Time   NA 139 07/08/2017 0942   K 3.8 07/08/2017 0942   CL 106 09/20/2015 1257   CL 109 (H) 12/31/2012 0940   CO2 18 (L) 07/08/2017 0942   BUN 17.7 07/08/2017 0942   CREATININE 1.0 07/08/2017 0942      Component Value Date/Time   CALCIUM 10.4 07/08/2017 0942   ALKPHOS 232 (H) 07/08/2017 0942   AST 54 (H) 07/08/2017 0942   ALT 39 07/08/2017 0942   BILITOT 0.42 07/08/2017  0942       Lab Results  Component Value Date   LABCA2 58 (H) 09/14/2012    No components found for: [id number]  No results for input(s): INR in the last 168 hours.  Urinalysis    Component Value Date/Time   COLORURINE YELLOW 09/20/2015 1432   APPEARANCEUR HAZY (A) 09/20/2015 1432   LABSPEC >1.030 (H) 09/20/2015 1432   LABSPEC 1.030 07/13/2015 0841   PHURINE 5.5 09/20/2015 1432   GLUCOSEU 500 (A) 09/20/2015 1432   GLUCOSEU Negative 07/13/2015 0841   HGBUR MODERATE (A) 09/20/2015 1432   BILIRUBINUR NEGATIVE 09/20/2015 1432   BILIRUBINUR Negative 07/13/2015 0841   KETONESUR NEGATIVE 09/20/2015 1432   PROTEINUR 30 (A) 09/20/2015 1432   UROBILINOGEN 0.2 07/13/2015 0841   NITRITE NEGATIVE 09/20/2015 1432   LEUKOCYTESUR NEGATIVE 09/20/2015 1432   LEUKOCYTESUR Negative 07/13/2015 0841    STUDIES: Ct Chest W Contrast  Result Date: 07/08/2017 CLINICAL DATA:  66 year old female with history of metastatic right-sided breast cancer originally diagnosed in 2005, currently undergoing ongoing oral chemotherapy. Previously treated with mastectomy and radiation therapy. Followup study. EXAM: CT CHEST WITH CONTRAST TECHNIQUE: Multidetector CT imaging of the chest was performed during intravenous contrast administration. CONTRAST:  2m ISOVUE-300 IOPAMIDOL (ISOVUE-300) INJECTION 61% COMPARISON:  Chest CT 01/21/2017. FINDINGS: RECIST 1.1 Target Lesions: 1. 5 mm right lower lobe subpleural nodule (axial image 101 of series 5) is stable. 2. 6 mm calcified subcarinal lymph node (axial image 71 of series 2) unchanged. Non-target Lesions: 1. Mild right pleural thickening unchanged. 2. Cluster of tiny 2-3 mm peribronchovascular micronodules in the apex of the right upper lobe, stable and most compatible  with areas of mucoid impaction within terminal bronchioles. Cardiovascular: Heart size is normal. There is no significant pericardial fluid, thickening or pericardial calcification. Aortic atherosclerosis.  No definite coronary artery calcifications. Mediastinum/Nodes: No pathologically enlarged mediastinal, hilar or internal mammary lymph nodes. Esophagus is unremarkable in appearance. No axillary lymphadenopathy. Surgical clips in the right axilla related to prior lymph node dissection. Lungs/Pleura: Cluster of tiny 2-3 mm peribronchovascular micronodules in the apex of the right upper lobe, stable and most compatible with areas of mucoid impaction within terminal bronchioles. 5 mm right lower lobe subpleural nodule (axial image 101 of series 5) is stable. Increasingly conspicuous 7 x 13 mm nodule in the inferior aspect of the right lower lobe (axial image 141 of series 5). New ill-defined 4 mm right lower lobe pulmonary nodule (axial image 84 of series 5). No acute consolidative airspace disease. No pleural effusions. Upper Abdomen: Diffuse low attenuation throughout the visualized hepatic parenchyma, compatible with severe hepatic steatosis. 1.4 x 1.0 cm left adrenal nodule (axial image 153 of series 2) is unchanged. Musculoskeletal: 9 mm centrally lucent and peripherally sclerotic lesion in the lateral aspect of the right second rib (axial image 30 of series 5) stable over numerous prior examinations, likely benign. No other definite suspicious appearing lytic or blastic lesions noted in the visualized portions of the skeleton. Status post right modified radical mastectomy and right axillary lymph node dissection. IMPRESSION: 1. New 7 x 13 mm nodule in the right lower lobe. This is nonspecific but is concerning for potential primary malignancy versus solitary metastatic lesion. The possibility of a benign area of scarring is also considered, but not strongly favored at this time. Close attention on followup studies is recommended, with repeat chest CT recommended in 3 months at this time. Other previously described lesions are stable compared to prior examinations, as above. 2. Stable mild right pleural  thickening. 3. Hepatic steatosis. 4. Additional incidental findings, as above. Aortic Atherosclerosis (ICD10-I70.0). Electronically Signed   By: Vinnie Langton M.D.   On: 07/08/2017 12:32     ASSESSMENT: 66 y.o. [place], [place] woman with stage IV breast cancer, on BOLERO-4 trial  (1) status post right mastectomy and sentinel lymph node sampling 03/22/2004 for a right upper-outer quadrant pT2 pN1, stage IIB invasive ductal carcinoma with lobular features, grade 2, estrogen receptor and progesterone receptor positive, HER-2 negative, with an MIB-1 of 9% ([id number] and [id number])  (2) addition all right axillary lymph node sampling 05/07/2004 showed 2 benign lymph nodes (4 lymph nodes previously removed, so total was one positive lymph node out of 6; [id number])  (3) the patient was evaluated by radiation oncology; no postmastectomy radiation recommended  (4) adjuvant chemotherapy with dose dense doxorubicin and cyclophosphamide x4 cycles (first cycle delayed one week) followed by dose dense paclitaxel x4 was completed 09/18/2004  (5) tamoxifen started March 2006, discontinued 2009  METASTATIC DISEASE: February 2013 (6) presenting with a large pericardial effusion, large right pleural effusion and possible right middle lobe bronchial obstruction February 2013, status post pericardial window placement, fiberoptic bronchoscopy and right Pleurx placement 09/11/2011, with biopsy of the bronchus intermedius and pericardium positive for metastatic breast cancer, estrogen receptor 91% positive with moderate staining intensity, progesterone receptor 100% positive with strong staining intensity, with an MIB-1 of 35%, and no HER-2 amplification, the signals ratio being 1.37 (SZA [id number])  (7) enrolled in BOLERO-4 trial 10/10/2011, receiving letrozole and everolimus  (a) two small areas of enhancement in the cerebellum noted by brain MRI 10/03/2011 were no longer  apparent on repeat MRI 08/21/2012-- most recent brain  MRI 04/01/2014 showed no evidence of intracranial metastatic disease  (b) sclerotic lesions in left iliac bone and sacrum have not been biopsied; stable; plan is to start zolendronate after patient updates her dental care (extraction planned)  (c) RLL lung nodule, stable (rescanned 07/12/2014 and 08/11/2014) R hilar and subcarinal lymph nodes: stable  (9) additional problems:  (a) hepatic steatosis  (b) COPD/ emphysema/ asthma  (c) advanced L hip osteoarthritis, status post left total hip replacement 06/07/2015  (d) aortoiliac atherosclerosis  (e) dental evaluation pending w possible dental extractions  (f) possible thalassemia trait  (10) Bone density concerns: DEXA scan 08/11/2013 was normal  (11) likely thalassemia trait  (12) hepatic steatosis.  PLAN: Barbara Parks will soon be 6 years out from definitive diagnosis of metastatic breast cancer, with an excellent functional status and no definitive evidence of disease activity.  This is very favorable.  She is tolerating the letrozole and everolimus well.  In particular there is no evidence of mucositis or pneumonitis.  She does have some mucus plugging and I think this may be what we are seeing in the "new" 1.3 cm lesion noted in the current CT scan.  Since everything else is completely stable, I think this is going to be a transient finding and I expect it will not be there when we repeat her CT scan in March  She knows to call for any other issues that may develop before her return visit here, 2 days after her next CAT scan.  Barbara Parks, Virgie Dad, MD  07/10/17 11:13 AM Medical Oncology and Hematology [facility] [address] [place], [place] [address] Tel. [phone]    Fax. [phone]  This document serves as a record of services personally performed by Lurline Del, MD. It was created on his behalf by Sheron Nightingale, a trained medical scribe. The creation of this record is based on the scribe's personal  observations and the provider's statements to them.   I have reviewed the above documentation for accuracy and completeness, and I agree with the above.

## 2017-07-09 LAB — CANCER ANTIGEN 27.29: CA 27.29: 62.6 U/mL — ABNORMAL HIGH (ref 0.0–38.6)

## 2017-07-10 ENCOUNTER — Other Ambulatory Visit: Payer: Self-pay | Admitting: Oncology

## 2017-07-10 ENCOUNTER — Ambulatory Visit (HOSPITAL_BASED_OUTPATIENT_CLINIC_OR_DEPARTMENT_OTHER): Payer: Medicare Other | Admitting: Oncology

## 2017-07-10 ENCOUNTER — Encounter: Payer: Self-pay | Admitting: *Deleted

## 2017-07-10 ENCOUNTER — Other Ambulatory Visit: Payer: Medicare Other

## 2017-07-10 ENCOUNTER — Telehealth: Payer: Self-pay | Admitting: Oncology

## 2017-07-10 VITALS — BP 128/65 | HR 91 | Temp 97.7°F | Resp 18 | Ht 64.0 in | Wt 157.2 lb

## 2017-07-10 DIAGNOSIS — C50411 Malignant neoplasm of upper-outer quadrant of right female breast: Secondary | ICD-10-CM

## 2017-07-10 DIAGNOSIS — C50911 Malignant neoplasm of unspecified site of right female breast: Secondary | ICD-10-CM

## 2017-07-10 DIAGNOSIS — Z170 Estrogen receptor positive status [ER+]: Secondary | ICD-10-CM | POA: Diagnosis not present

## 2017-07-10 DIAGNOSIS — R911 Solitary pulmonary nodule: Secondary | ICD-10-CM | POA: Diagnosis not present

## 2017-07-10 DIAGNOSIS — C801 Malignant (primary) neoplasm, unspecified: Secondary | ICD-10-CM

## 2017-07-10 DIAGNOSIS — Z006 Encounter for examination for normal comparison and control in clinical research program: Secondary | ICD-10-CM

## 2017-07-10 DIAGNOSIS — I313 Pericardial effusion (noninflammatory): Secondary | ICD-10-CM

## 2017-07-10 DIAGNOSIS — C7800 Secondary malignant neoplasm of unspecified lung: Secondary | ICD-10-CM

## 2017-07-10 DIAGNOSIS — C773 Secondary and unspecified malignant neoplasm of axilla and upper limb lymph nodes: Secondary | ICD-10-CM

## 2017-07-10 DIAGNOSIS — Z79811 Long term (current) use of aromatase inhibitors: Secondary | ICD-10-CM | POA: Diagnosis not present

## 2017-07-10 DIAGNOSIS — I3131 Malignant pericardial effusion in diseases classified elsewhere: Secondary | ICD-10-CM

## 2017-07-10 MED ORDER — INV-EVEROLIMUS (RAD0001) 5MG TABLET NOVARTIS CRAD001Y24135: 2.0000 | ORAL_TABLET | Freq: Every day | ORAL | 0 refills | Status: DC

## 2017-07-10 MED ORDER — ROSUVASTATIN CALCIUM 5 MG PO TABS: 5.0000 mg | ORAL_TABLET | Freq: Every day | ORAL | 6 refills | Status: DC

## 2017-07-10 NOTE — Progress Notes (Signed)
07/10/17 at 12:10pm- [employer]is/Bolero 4 - Extension Phase #8 visit-  The patient was into the clinic this morning unaccompanied for her routine 12-week follow-up visit and study drug dispensing. Patient returned 6 kits of everolimus with 4 tablets unopened.  The pt's overall 12 week study drug compliance is 97.6% for the date range of 04/17/2017- 07/09/2017. Patient returned paper calendars documenting daily doses of letrozole and everolimus with 2 daily doses missed on 05/16/17 and 06/30/17.   Medication was returned to pharmacy for drug accountability by pharmacist, Raul Del. Patient reports the previously noted AE's (fatigue, hot flashes, dry skin, back pain, and memory impairment) as ongoingand unchanged in severity.Patient reports her blurry vision in her left eye is still ongoing.   She has not yet scheduled an appointment with her ophthalmologist for additional follow up. She denies any current problems with nausea and heartburn.  Based on lab results review, and CT chest scans, and history and physical by Dr. Jana Hakim patient condition is acceptable for continued treatment, and in his opinion she is still receiving benefit from her study treatment. He felt that the new CT finding was probably "mucoid impaction", and he felt that it will probably be gone before her next scan.  He was in agreement with the radiologist to re-scan her in 3 months for further evaluation of her "new lung nodule".  Upon review of her lipid panel, he added Crestor 5 mg to be taken in addition to her already prescribed Lopid.  The research nurse had Raul Del, pharmacist, check her current medications with the new drug, Crestor, for any possible drug interactions.  The combination of Lopid and Crestor was of concern.  The pharmacist printed the research nurse a handout about the drug combination.  The research nurse then took the handout to Dr. Jana Hakim and told him that there was some concern about the combination of the  2 drugs.  He said that he was only giving her 5 mg, and he felt that she needed another medication to get her lipid panel in a better range.  He said that he did inform the pt to call him if she developed any signs of rhabdomyolysis (muscle aches).  The research nurse also called the pt after her visit to remind her to call for any new side effects after she begins her new drug, Crestor.  The pt said that she would probably start her new medication next week.  The nurse explained to the pt that Dr. Jana Hakim is aware of the risks associated with the drug combination (Crestor and Lopid), and he is comfortable with them since the Crestor is only 5 mg.   Six kits of study drug everolimus were dispensed today for the next 12 weeks supply. In addition, patient will continue commercial supply of letrozole 2.5mg  daily. The next study visit is expected to occur on 10/02/2017. The pt's CT scan will be done on 09/30/17 along with the pt's labs.  The research nurse thanked the patient for her continued participation in the study.  Brion Aliment RN, BSN, CCRP Clinical Research Nurse 07/10/2017 1:42 PM   Adverse Event Log Protocol: Novartis [id number] Cycle: Extension Phase #7(AE date range 04/17/17-07/10/17) Ongoing and unchanged in severity:fatigue, hot flashes (mild), memory impairment, dry skin, and back pain  Event Grade Onset Date Resolved Date Attribution to letrozole Attribution to everolimus  Blurred vision, left eye 1 06/28/2016 Ongoing Not suspected Not suspected  Alkaline phosphatase increased 1 08/14/2012 Ongoing Not suspected Suspected  AST increased  1 11/30/2015 Ongoing Not suspected Suspected  Albumin decreased 1 04/17/2017 Ongoing Not suspected Suspected  Hypercalcemia 1 01/21/2017 07/08/2017 Not suspected Not suspected  Heartburn 1 04/17/2017 07/10/2017 Not suspected Not suspected  Nausea 1 04/17/2017 07/10/2017 Not suspected Suspected

## 2017-07-10 NOTE — Addendum Note (Signed)
Addended by: Chauncey Cruel on: 07/10/2017 12:45 PM   Modules accepted: Orders

## 2017-07-10 NOTE — Telephone Encounter (Signed)
Patient declined AVs and calendar of upcoming March 2019 appointments.

## 2017-07-11 ENCOUNTER — Other Ambulatory Visit: Payer: Self-pay | Admitting: *Deleted

## 2017-07-11 DIAGNOSIS — E782 Mixed hyperlipidemia: Secondary | ICD-10-CM

## 2017-07-16 ENCOUNTER — Other Ambulatory Visit: Payer: Self-pay | Admitting: *Deleted

## 2017-07-16 DIAGNOSIS — R739 Hyperglycemia, unspecified: Secondary | ICD-10-CM

## 2017-07-16 DIAGNOSIS — C50911 Malignant neoplasm of unspecified site of right female breast: Secondary | ICD-10-CM

## 2017-07-16 DIAGNOSIS — C7800 Secondary malignant neoplasm of unspecified lung: Principal | ICD-10-CM

## 2017-07-16 DIAGNOSIS — E782 Mixed hyperlipidemia: Secondary | ICD-10-CM

## 2017-07-28 ENCOUNTER — Other Ambulatory Visit: Payer: Self-pay | Admitting: Oncology

## 2017-09-03 IMAGING — CT CT CHEST W/ CM
2 of 6 series · 15 of 37 positions shown, 18 images · IV contrast (iopamidol)
Comparison: CT 11/28/2015

CLINICAL DATA: RIGHT-sided breast cancer diagnosed 8447 and 0164.
RIGHT mastectomy. Oral chemotherapy. Lung metastasis. Recist
protocol

EXAM:
CT CHEST WITH CONTRAST
TECHNIQUE: Multidetector CT imaging of the chest, was performed following the
standard protocol during bolus administration of intravenous
contrast.
CONTRAST:  75mL B1QP8H-2PP IOPAMIDOL (B1QP8H-2PP) INJECTION 61%

[Series 2: chest with st · axial · 0.74mm/px · z∈[-512,-224]mm · 12 of 158 slices shown, 15 images]
[im 7/158  mediastinal]
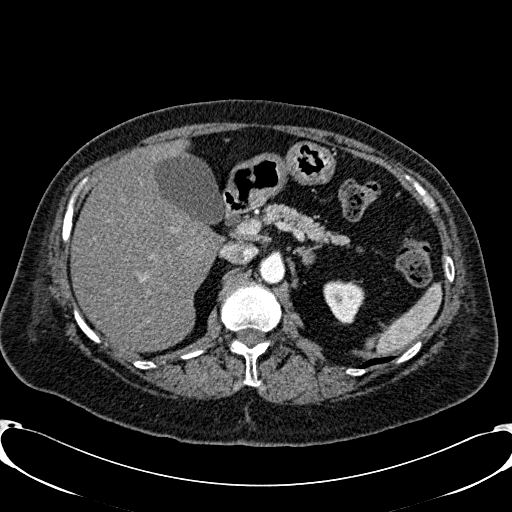
[im 7/158  lung]
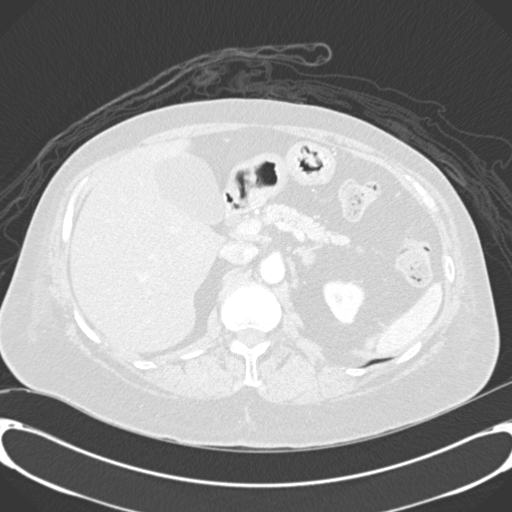
[im 20/158  lung]
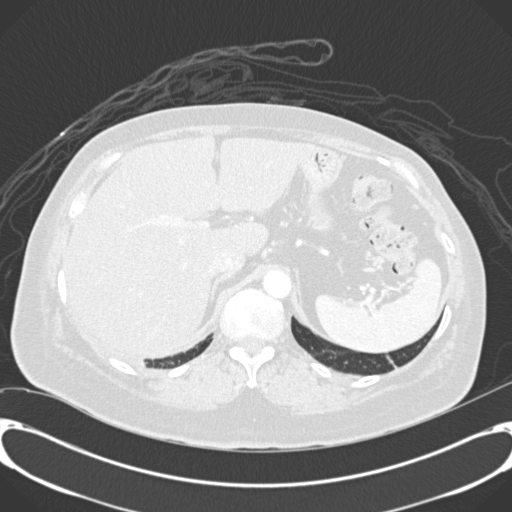
[im 33/158  lung]
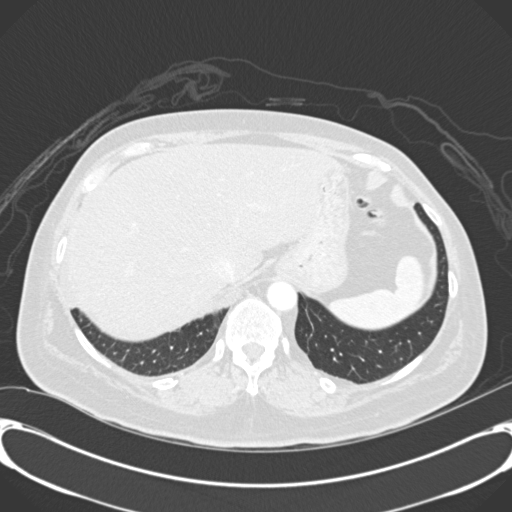
[im 46/158  lung]
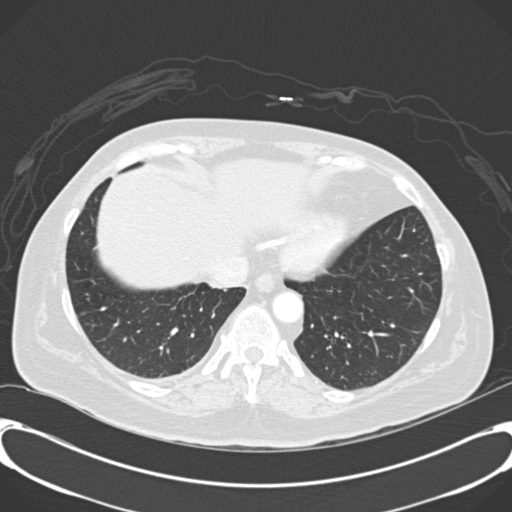
[im 59/158  mediastinal]
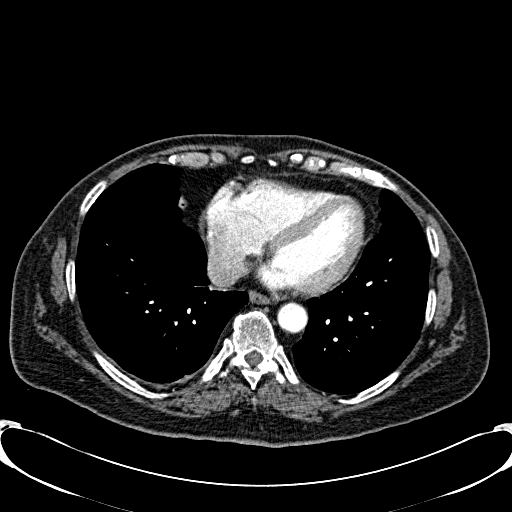
[im 59/158  lung]
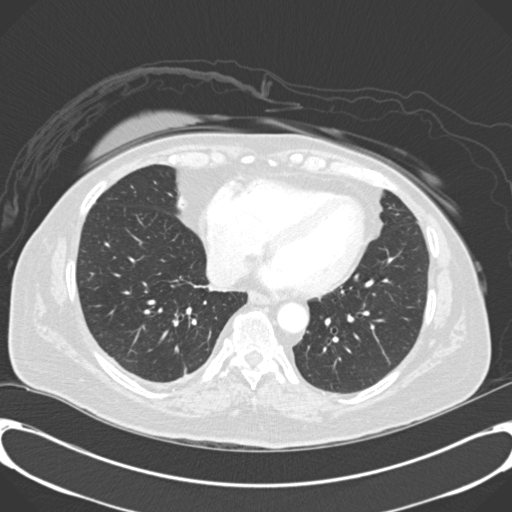
[im 72/158  lung]
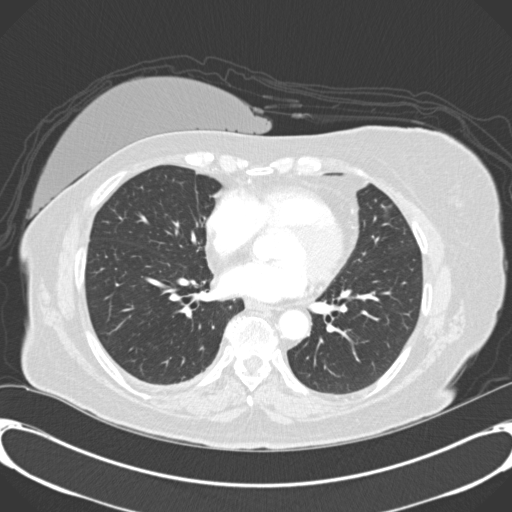
[im 86/158  lung]
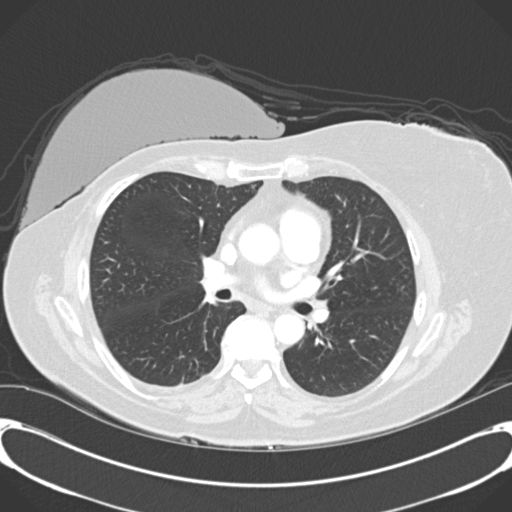
[im 99/158  lung]
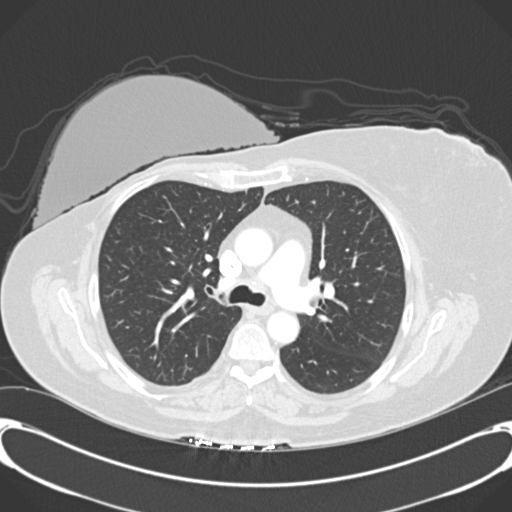
[im 112/158  mediastinal]
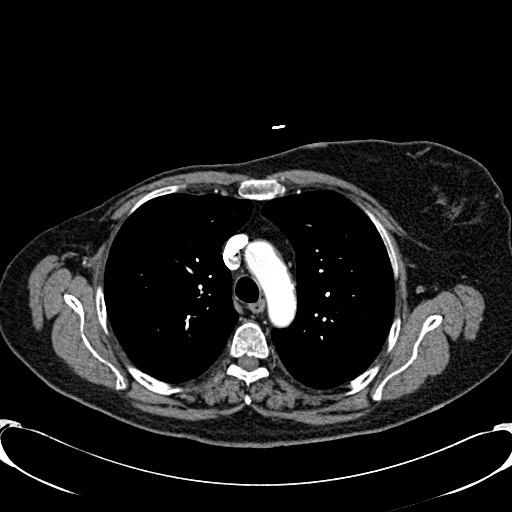
[im 112/158  lung]
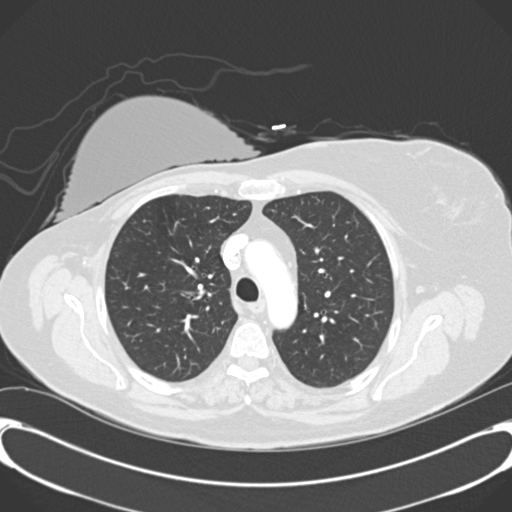
[im 125/158  lung]
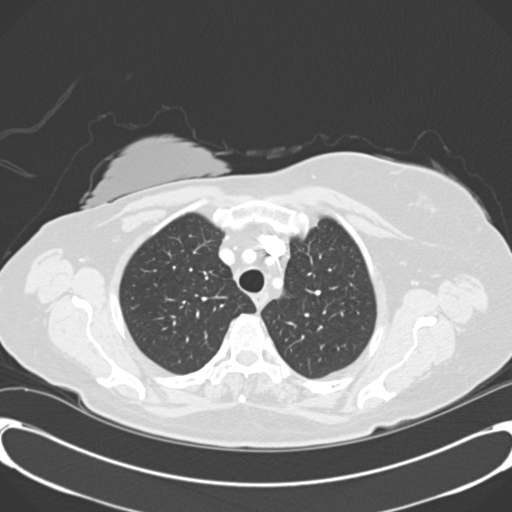
[im 138/158  lung]
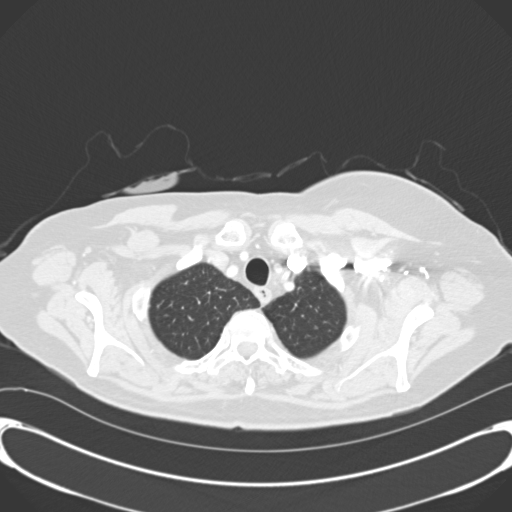
[im 151/158  lung]
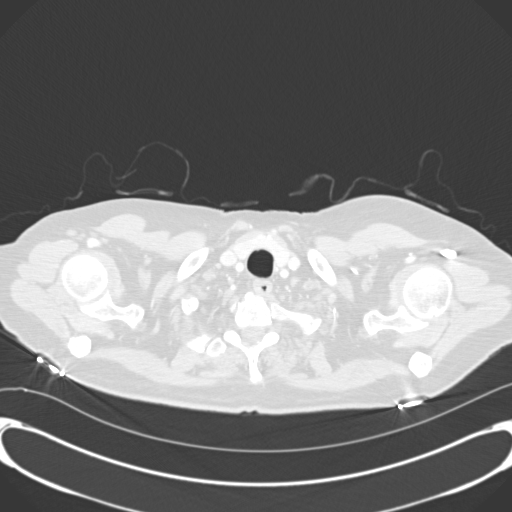

[Series 603: <mpr thick range> · coronal · 0.74mm/px · 3 of 118 slices shown]
[im 24/118  lung]
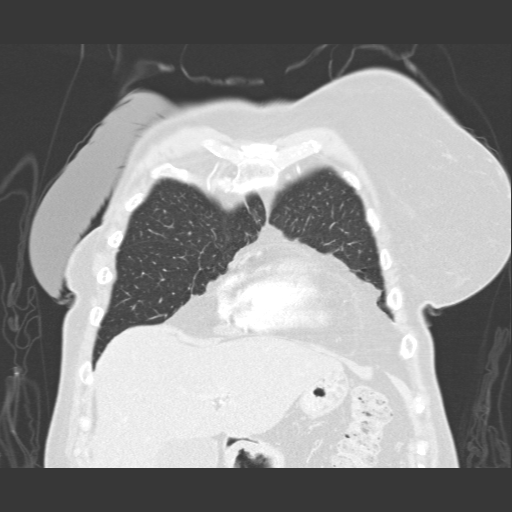
[im 47/118  lung]
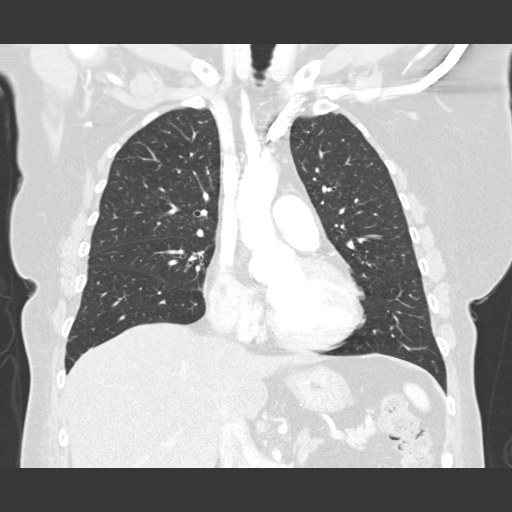
[im 71/118  lung]
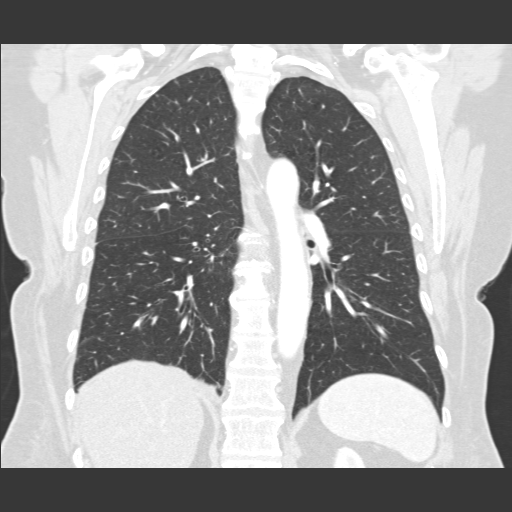

[15 of 37 positions shown; findings below may reference images not displayed]

RECIST

Target Lesions:

1. RIGHT lower lobe nodule pulmonary nodule measures 5 mm (image 97,
series [DATE]. Subcarinal lymph node measures 6 mm (image 69, series [DATE]. RIGHT hilar lymph node measures 0 mm
Non-target Lesions:

1. Small RIGHT pleural effusion stable.
2. RIGHT upper lobe pulmonary nodule stable.
FINDINGS: 
FINDINGS: CT CHEST FINDINGS

Mediastinum/Nodes: No axillary or supraclavicular lymphadenopathy.
Post RIGHT lymphadenectomy. No mediastinal or hilar lymphadenopathy.
No pericardial fluid. Esophagus normal.

Lungs/Pleura: Trace pleural thickening and fluid in the RIGHT hemi
thorax.

Stable RIGHT lower lobe pulmonary nodule measures 5 mm (image 97,
series 7).

Tiny subpleural 3 mm nodule in the RIGHT upper lobe (image 22,
series 7) unchanged. No new pulmonary nodules.

Musculoskeletal: No aggressive osseous lesion.

Upper abdomen: : No focal hepatic lesion limited view of liver.
There is thickening in the gallbladder fundus to 10 mm not changed
from prior.
IMPRESSION: 1. No evidence of breast cancer recurrence or progression.
2. Stable small RIGHT pulmonary nodules.
3. Stable thickening of the gallbladder fundus.

## 2017-09-30 ENCOUNTER — Other Ambulatory Visit: Payer: Medicare Other

## 2017-09-30 ENCOUNTER — Encounter (HOSPITAL_COMMUNITY): Payer: Self-pay

## 2017-09-30 ENCOUNTER — Inpatient Hospital Stay: Payer: Medicare Other | Attending: Oncology

## 2017-09-30 ENCOUNTER — Ambulatory Visit (HOSPITAL_COMMUNITY)
Admission: RE | Admit: 2017-09-30 | Discharge: 2017-09-30 | Disposition: A | Discharge status: Home / Self Care | Payer: Medicare Other | Source: Ambulatory Visit | Attending: Oncology | Admitting: Oncology

## 2017-09-30 DIAGNOSIS — Z170 Estrogen receptor positive status [ER+]: Secondary | ICD-10-CM | POA: Insufficient documentation

## 2017-09-30 DIAGNOSIS — Z9011 Acquired absence of right breast and nipple: Secondary | ICD-10-CM | POA: Insufficient documentation

## 2017-09-30 DIAGNOSIS — M899 Disorder of bone, unspecified: Secondary | ICD-10-CM | POA: Insufficient documentation

## 2017-09-30 DIAGNOSIS — Z9221 Personal history of antineoplastic chemotherapy: Secondary | ICD-10-CM | POA: Insufficient documentation

## 2017-09-30 DIAGNOSIS — C801 Malignant (primary) neoplasm, unspecified: Secondary | ICD-10-CM

## 2017-09-30 DIAGNOSIS — R911 Solitary pulmonary nodule: Secondary | ICD-10-CM | POA: Insufficient documentation

## 2017-09-30 DIAGNOSIS — C50411 Malignant neoplasm of upper-outer quadrant of right female breast: Secondary | ICD-10-CM | POA: Diagnosis not present

## 2017-09-30 DIAGNOSIS — Z79811 Long term (current) use of aromatase inhibitors: Secondary | ICD-10-CM | POA: Insufficient documentation

## 2017-09-30 DIAGNOSIS — C50911 Malignant neoplasm of unspecified site of right female breast: Secondary | ICD-10-CM

## 2017-09-30 DIAGNOSIS — I313 Pericardial effusion (noninflammatory): Secondary | ICD-10-CM

## 2017-09-30 DIAGNOSIS — C7800 Secondary malignant neoplasm of unspecified lung: Secondary | ICD-10-CM | POA: Insufficient documentation

## 2017-09-30 DIAGNOSIS — C773 Secondary and unspecified malignant neoplasm of axilla and upper limb lymph nodes: Secondary | ICD-10-CM | POA: Diagnosis not present

## 2017-09-30 DIAGNOSIS — K760 Fatty (change of) liver, not elsewhere classified: Secondary | ICD-10-CM | POA: Diagnosis not present

## 2017-09-30 DIAGNOSIS — D3502 Benign neoplasm of left adrenal gland: Secondary | ICD-10-CM | POA: Diagnosis not present

## 2017-09-30 DIAGNOSIS — Z87891 Personal history of nicotine dependence: Secondary | ICD-10-CM | POA: Insufficient documentation

## 2017-09-30 DIAGNOSIS — I3131 Malignant pericardial effusion in diseases classified elsewhere: Secondary | ICD-10-CM

## 2017-09-30 DIAGNOSIS — E782 Mixed hyperlipidemia: Secondary | ICD-10-CM

## 2017-09-30 LAB — CBC WITH DIFFERENTIAL/PLATELET
BASOS ABS: 0 10*3/uL (ref 0.0–0.1)
BASOS PCT: 1 %
Eosinophils Absolute: 0.1 10*3/uL (ref 0.0–0.5)
Eosinophils Relative: 2 %
HEMATOCRIT: 41.5 % (ref 34.8–46.6)
HEMOGLOBIN: 13.7 g/dL (ref 11.6–15.9)
Lymphocytes Relative: 20 %
Lymphs Abs: 1.3 10*3/uL (ref 0.9–3.3)
MCH: 26.7 pg (ref 25.1–34.0)
MCHC: 33 g/dL (ref 31.5–36.0)
MCV: 80.7 fL (ref 79.5–101.0)
MONOS PCT: 8 %
Monocytes Absolute: 0.5 10*3/uL (ref 0.1–0.9)
NEUTROS ABS: 4.6 10*3/uL (ref 1.5–6.5)
NEUTROS PCT: 69 %
Platelets: 294 10*3/uL (ref 145–400)
RBC: 5.14 MIL/uL (ref 3.70–5.45)
RDW: 14.1 % (ref 11.2–14.5)
WBC: 6.6 10*3/uL (ref 3.9–10.3)

## 2017-09-30 LAB — COMPREHENSIVE METABOLIC PANEL
ALBUMIN: 3.5 g/dL (ref 3.5–5.0)
ALK PHOS: 246 U/L — AB (ref 40–150)
ALT: 59 U/L — AB (ref 0–55)
AST: 77 U/L — AB (ref 5–34)
Anion gap: 11 (ref 3–11)
BILIRUBIN TOTAL: 0.3 mg/dL (ref 0.2–1.2)
BUN: 16 mg/dL (ref 7–26)
CALCIUM: 10.5 mg/dL — AB (ref 8.4–10.4)
CO2: 22 mmol/L (ref 22–29)
Chloride: 104 mmol/L (ref 98–109)
Creatinine, Ser: 0.99 mg/dL (ref 0.60–1.10)
GFR calc Af Amer: >60 mL/min (ref >60)
GFR calc non Af Amer: 58 mL/min — ABNORMAL LOW (ref >60)
GLUCOSE: 209 mg/dL — AB (ref 70–140)
Potassium: 4.3 mmol/L (ref 3.5–5.1)
Sodium: 137 mmol/L (ref 136–145)
TOTAL PROTEIN: 8.3 g/dL (ref 6.4–8.3)

## 2017-09-30 LAB — LIPID PANEL
Cholesterol: 285 mg/dL — ABNORMAL HIGH (ref 0–200)
HDL: 54 mg/dL (ref >40)
LDL Cholesterol: 167 mg/dL — ABNORMAL HIGH (ref 0–99)
Total CHOL/HDL Ratio: 5.3 RATIO
Triglycerides: 321 mg/dL — ABNORMAL HIGH (ref <150)
VLDL: 64 mg/dL — ABNORMAL HIGH (ref 0–40)

## 2017-09-30 MED ORDER — SODIUM CHLORIDE 0.9 % IJ SOLN
INTRAMUSCULAR | Status: AC
  Filled 2017-09-30: qty 50

## 2017-09-30 MED ORDER — IOPAMIDOL (ISOVUE-300) INJECTION 61%
75.0000 mL | Freq: Once | INTRAVENOUS | Status: AC | PRN
  Administered 2017-09-30: 75 mL via INTRAVENOUS

## 2017-09-30 MED ORDER — IOPAMIDOL (ISOVUE-300) INJECTION 61%
INTRAVENOUS | Status: AC
  Filled 2017-09-30: qty 75

## 2017-10-01 LAB — CANCER ANTIGEN 27.29: CA 27.29: 62.8 U/mL — ABNORMAL HIGH (ref 0.0–38.6)

## 2017-10-01 NOTE — Progress Notes (Signed)
[place]  [phone] [phone]) [phone]     ID: Barbara Parks DOB: 1950-10-03  MR#: [id number]  ASN#:[id number]  Patient Care Team: Darcus Austin, MD as PCP - General (Family Medicine) Jerline Pain, MD (Cardiology) Gaye Pollack, MD (Cardiothoracic Surgery) Keundra Petrucelli, Virgie Dad, MD as Consulting Physician (Oncology) OTHER MD: Dr Mitzie Na, DDS; Gaynelle Arabian MD  CHIEF COMPLAINT: metastatic breast cancer, on BOLERO study  CURRENT TREATMENT: letrozole + everolimus  BREAST CANCER HISTORY: From Dr. Collier [name]'s original intake note 04/13/2004:  "This woman has been in good health all of her life. She recently moved from [place] to work here.  She palpated a mass at about the 12 o'clock position in mid-July. She has not noticed any nipple retraction or skin changes.  She was seen by her primary care doctor who subsequently referred her for a mammogram.  Mammogram was performed on 03/01/04. This demonstrated a spiculated 2 cm mass at the 12 o'clock position in the right breast.  Upper outer quadrant of the left breast shows some distortion.   Physical exam at that time showed a firm, nontender nodule at the 12 o'clock position in the right breast, 5 cm from the nipple.  Ultrasound of this area showed a hypoechoic ill-defined mass, measuring 2.2 x 1.3 x 1.4 cm.  Physical exam of the left breast showed general vague thickening, upper outer quadrant of the left breast with a discrete palpable mass. The ultrasound performed showed a single hypoechoic ill-defined nodule at the 1 o'clock position, measuring 7 x 5 x 6 mm.  She had biopsies of both lesions on 03/02/03.  Needle core biopsy of the lesion on the right breast revealed invasive mammary carcinoma.  Needle core biopsy of the left breast showed a complex fibroadenoma.  Prognostic panel of the lesion on the right breast showed it to be ER positive at 73%, PR positive at 90% and proliferative index 9%,  HER-2 was 1+.  Patient was referred to Dr. Annamaria Boots, who performed a simple mastectomy with sentinel lymph node evaluation on 03/22/04.  Final pathology showed this to be an invasive ductal carcinoma  with lobular features, measuring 2.2 cm, grade 2 of 3.  Margins negative for carcinoma.  Invasive ductal carcinoma was extended to involve deep dermis of the nipple.  Lymphovascular invasion was identified.  Total of 4 sentinel lymph nodes were evaluated.  Touch imprints at the time of the OR was felt to be negative.  Subsequent evaluation showed a 5 mm focus of metastatic carcinoma in one of the four lymph nodes on microscopic after sectioning.  There was extracapsular extension  of one lymph node as well. The remaining three lymph nodes were all negative."  The patient's subsequent history is detailed below.  INTERVAL HISTORY: Barbara Parks returns today for follow-up and treatment of her metastatic estrogen receptor positive breast cancer. She continues on the [facility] trial this being her extension face visit . She tolerates letrozole well. She has some hot flashes but this isn't a major issue for her. She denies issues with vaginal dryness. She takes it with food, which has helped prevent nausea.   In addition to letrozole, she takes everolimus, 5 mg daily. She has had some stomach issues. She also feels more fatigued.  She has had no evidence of mucositis or pneumonitis on the.  She completed a chest CT on 09/30/2017 showing: Stable 7 x 13 mm pulmonary nodule in right lower lobe posterior costophrenic sulcus. No new or  progressive disease within the thorax. Stable mild right posterior pleural thickening. Stable hepatic steatosis and small benign left adrenal adenoma.   REVIEW OF SYSTEMS: Barbara Parks reports that she has been walking more when the weather is nice. She is doing resistance and light weight lift training. She also walks her dog. She denies unusual headaches, visual changes, nausea, vomiting, or dizziness.  There has been no unusual cough, phlegm production, or pleurisy. This been no change in bowel or bladder habits. She denies unexplained fatigue or unexplained weight loss, bleeding, rash, or fever. A detailed review of systems was otherwise stable.    PAST MEDICAL HISTORY: Past Medical History:  Diagnosis Date  . Arthritis    left hip  . Asthma   . breast ca 2005   breast/chemo R mastectomy  . GERD (gastroesophageal reflux disease)   . Hypercholesteremia   . Metastasis to lung ([place]) dx'd 08/2011  . Osteopenia due to cancer therapy 09/09/2013  . Palpitations   . Shortness of breath     PAST SURGICAL HISTORY: Past Surgical History:  Procedure Laterality Date  . BREAST SURGERY  2005   right  . CATARACT EXTRACTION W/PHACO Left 01/18/2014   Procedure: CATARACT EXTRACTION PHACO AND INTRAOCULAR LENS PLACEMENT (IOC);  Surgeon: Elta Guadeloupe T. Gershon Crane, MD;  Location: [facility];  Service: Ophthalmology;  Laterality: Left;  CDE 15.79  . CATARACT EXTRACTION W/PHACO Right 02/08/2014   Procedure: CATARACT EXTRACTION PHACO AND INTRAOCULAR LENS PLACEMENT (IOC);  Surgeon: Elta Guadeloupe T. Gershon Crane, MD;  Location: [facility];  Service: Ophthalmology;  Laterality: Right;  CDE 4.41  . CHEST TUBE INSERTION  09/11/2011   Procedure: INSERTION PLEURAL DRAINAGE CATHETER;  Surgeon: Gaye Pollack, MD;  Location: [place];  Service: Thoracic;  Laterality: Right;  . PERICARDIAL WINDOW  09/11/2011   Procedure: PERICARDIAL WINDOW;  Surgeon: Gaye Pollack, MD;  Location: [facility];  Service: Thoracic;  Laterality: N/A;  . REMOVAL OF PLEURAL DRAINAGE CATHETER  12/19/2011   Procedure: REMOVAL OF PLEURAL DRAINAGE CATHETER;  Surgeon: Gaye Pollack, MD;  Location: [place];  Service: Thoracic;  Laterality: Right;  TO BE DONE IN MINOR ROOM, SHORT STAY  . [place]  . TOTAL HIP ARTHROPLASTY Left 06/07/2015   Procedure: LEFT TOTAL HIP ARTHROPLASTY ANTERIOR APPROACH;  Surgeon: Gaynelle Arabian, MD;  Location: [facility];  Service: Orthopedics;  Laterality:  Left;  Marland Kitchen VIDEO BRONCHOSCOPY  09/11/2011   Procedure: VIDEO BRONCHOSCOPY;  Surgeon: Gaye Pollack, MD;  Location[facility];  Service: Thoracic;  Laterality: N/A;    FAMILY HISTORY Family History  Problem Relation Age of Onset  . Cancer Mother        breast  . Heart disease Father   . Anesthesia problems Neg Hx    the patient's mother was diagnosed with breast cancer at age 76, she died age 74 from congestive heart failure..The patient's father died from heart disease at age 57.  She has one sister alive & well.  Two brothers alive & well, one with diabetes. The patient's sister was also diagnosed with breast cancer, and was tested for the BRCA gene, and was negative. The patient herself has not been tested. There is no history of ovarian cancer in the family  GYNECOLOGIC HISTORY:  No LMP recorded. Patient is postmenopausal. Menarche age 78, the patient is GX P0. She stopped having periods with chemotherapy in 2005. 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 She was a Archivist. She is single, lives alone with her Shitzu-poodle Sammie.. Family is all in the [place] area.  Note her brother is currently under treatment as discussed today    ADVANCED DIRECTIVES: Not in place. At the 02/23/2014 visit the patient was given the appropriate documents to complete and notarize at her discretion. She tells me she is planning to name her sister, Billie Ruddy, as healthcare power of attorney. [name] can be reached at [phone]   HEALTH MAINTENANCE: Social History   Tobacco Use  . Smoking status: Former Smoker    Packs/day: 1.50    Years: 30.00    Pack years: 45.00    Types: Cigarettes    Last attempt to quit: 09/10/2007    Years since quitting: 10.0  . Smokeless tobacco: Never Used  Substance Use Topics  . Alcohol use: No  . Drug use: No     Colonoscopy:  PAP:  Bone  density: 08/14/2015, T score -1.6  Lipid panel:  Allergies  Allergen Reactions  . Aspirin     REACTION: upset stomach  . Latex Other (See Comments)    Blistering and skin peels off  . Nsaids Nausea And Vomiting    Extreme nausea and vomiting    Current Outpatient Medications  Medication Sig Dispense Refill  . cholecalciferol (VITAMIN D) 1000 units tablet Take 1,000 Units by mouth 2 (two) times daily.    Marland Kitchen gemfibrozil (LOPID) 600 MG tablet TAKE 1 TABLET BY MOUTH TWICE DAILY BEFORE MEAL(S) 180 tablet 0  . Investigational everolimus (RAD001) 5 MG tablet Novartis [id number] Take 2 tablets by mouth daily. Take with a glass of water. 168 tablet 0  . letrozole (FEMARA) 2.5 MG tablet TAKE 1 TABLET (2.5 MG TOTAL) BY MOUTH DAILY. 90 tablet 3  . loperamide (IMODIUM) 2 MG capsule Take 2 mg by mouth 4 (four) times daily as needed for diarrhea or loose stools. Reported on 11/30/2015    . metoprolol tartrate (LOPRESSOR) 25 MG tablet Take 0.5 tablets (12.5 mg total) by mouth daily. 30 tablet 4  . omeprazole (PRILOSEC) 40 MG capsule Take 1 capsule (40 mg total) by mouth daily as needed. 90 capsule 3  . ondansetron (ZOFRAN) 8 MG tablet Take 1 tablet (8 mg total) by mouth 2 (two) times daily as needed (Nausea or vomiting). 30 tablet 1  . prochlorperazine (COMPAZINE) 25 MG suppository Place 1 suppository (25 mg total) rectally every 12 (twelve) hours as needed for nausea. 12 suppository 3  . rosuvastatin (CRESTOR) 5 MG tablet Take 1 tablet (5 mg total) by mouth daily. 30 tablet 6   No current facility-administered medications for this visit.     OBJECTIVE: Middle-aged white woman in no acute distress  Vitals:   10/02/17 1041 10/02/17 1042  BP: 124/71 (!) 122/54  Pulse: 92 88  Resp:    Temp:    SpO2:       Body mass index is 27.53 kg/m.    ECOG FS:0 - Asymptomatic  Sclerae unicteric, EOMs intact Oropharynx clear and moist No cervical or supraclavicular adenopathy Lungs no rales or  rhonchi Heart regular rate and rhythm Abd soft, nontender, positive bowel sounds MSK no focal spinal tenderness, no upper extremity lymphedema Neuro: nonfocal, well oriented, appropriate affect Breasts: She has undergone right mastectomy.  There is no evidence of chest wall recurrence.  The left breast is benign.  Both axillae are benign.  LAB  RESULTS:  CMP  Component Value Date/Time   NA 137 09/30/2017 0949   NA 139 07/08/2017 0942   K 4.3 09/30/2017 0949   K 3.8 07/08/2017 0942   CL 104 09/30/2017 0949   CL 109 (H) 12/31/2012 0940   CO2 22 09/30/2017 0949   CO2 18 (L) 07/08/2017 0942   GLUCOSE 209 (H) 09/30/2017 0949   GLUCOSE 156 (H) 07/08/2017 0942   GLUCOSE 127 (H) 12/31/2012 0940   BUN 16 09/30/2017 0949   BUN 17.7 07/08/2017 0942   CREATININE 0.99 09/30/2017 0949   CREATININE 1.0 07/08/2017 0942   CALCIUM 10.5 (H) 09/30/2017 0949   CALCIUM 10.4 07/08/2017 0942   PROT 8.3 09/30/2017 0949   PROT 7.7 07/08/2017 0942   ALBUMIN 3.5 09/30/2017 0949   ALBUMIN 3.4 (L) 07/08/2017 0942   AST 77 (H) 09/30/2017 0949   AST 54 (H) 07/08/2017 0942   ALT 59 (H) 09/30/2017 0949   ALT 39 07/08/2017 0942   ALKPHOS 246 (H) 09/30/2017 0949   ALKPHOS 232 (H) 07/08/2017 0942   BILITOT 0.3 09/30/2017 0949   BILITOT 0.42 07/08/2017 0942   GFRNONAA 58 (L) 09/30/2017 0949   GFRAA >60 09/30/2017 0949    I No results found for: SPEP  Lab Results  Component Value Date   WBC 6.6 09/30/2017   NEUTROABS 4.6 09/30/2017   HGB 13.7 09/30/2017   HCT 41.5 09/30/2017   MCV 80.7 09/30/2017   PLT 294 09/30/2017      Chemistry      Component Value Date/Time   NA 137 09/30/2017 0949   NA 139 07/08/2017 0942   K 4.3 09/30/2017 0949   K 3.8 07/08/2017 0942   CL 104 09/30/2017 0949   CL 109 (H) 12/31/2012 0940   CO2 22 09/30/2017 0949   CO2 18 (L) 07/08/2017 0942   BUN 16 09/30/2017 0949   BUN 17.7 07/08/2017 0942   CREATININE 0.99 09/30/2017 0949   CREATININE 1.0 07/08/2017 0942       Component Value Date/Time   CALCIUM 10.5 (H) 09/30/2017 0949   CALCIUM 10.4 07/08/2017 0942   ALKPHOS 246 (H) 09/30/2017 0949   ALKPHOS 232 (H) 07/08/2017 0942   AST 77 (H) 09/30/2017 0949   AST 54 (H) 07/08/2017 0942   ALT 59 (H) 09/30/2017 0949   ALT 39 07/08/2017 0942   BILITOT 0.3 09/30/2017 0949   BILITOT 0.42 07/08/2017 0942       Lab Results  Component Value Date   LABCA2 58 (H) 09/14/2012    No components found for: [id number]  No results for input(s): INR in the last 168 hours.  Urinalysis    Component Value Date/Time   COLORURINE YELLOW 09/20/2015 1432   APPEARANCEUR HAZY (A) 09/20/2015 1432   LABSPEC >1.030 (H) 09/20/2015 1432   LABSPEC 1.030 07/13/2015 0841   PHURINE 5.5 09/20/2015 1432   GLUCOSEU 500 (A) 09/20/2015 1432   GLUCOSEU Negative 07/13/2015 0841   HGBUR MODERATE (A) 09/20/2015 1432   BILIRUBINUR NEGATIVE 09/20/2015 1432   BILIRUBINUR Negative 07/13/2015 0841   KETONESUR NEGATIVE 09/20/2015 1432   PROTEINUR 30 (A) 09/20/2015 1432   UROBILINOGEN 0.2 07/13/2015 0841   NITRITE NEGATIVE 09/20/2015 1432   LEUKOCYTESUR NEGATIVE 09/20/2015 1432   LEUKOCYTESUR Negative 07/13/2015 0841    STUDIES: Ct Chest W Contrast  Result Date: 09/30/2017 CLINICAL DATA:  Right breast carcinoma metastatic to lung. Undergoing oral chemotherapy. Restaging. EXAM: CT CHEST WITH CONTRAST TECHNIQUE: Multidetector CT imaging of the chest was performed during intravenous contrast administration.  CONTRAST:  34m ISOVUE-300 IOPAMIDOL (ISOVUE-300) INJECTION 61% COMPARISON:  07/08/2017 FINDINGS: RECIST 1.0 Target Lesions: 1. 5 mm right lower lobe pulmonary nodule, image 100 of series 5, unchanged 2. 6 mm subcarinal lymph node, image 71 of series 2, unchanged Non-target Lesions: 1. Mild right posterior pleural thickening, unchanged Cardiovascular:  No acute findings. Mediastinum/Nodes: No masses or pathologically enlarged lymph nodes identified. 6 mm partially calcified  subcarinal mediastinal lymph node on image 71/2 is stable. Previous right mastectomy and axillary lymph node dissection, without axillary lymphadenopathy. Lungs/Pleura: 7 x 13 pulmonary nodule in the posterior right lower lobe is stable since previous study. A 5 mm peripheral pulmonary nodule also in the posterior right lower lobe on image 100/5 is also stable. No new or enlarging pulmonary nodules or masses identified. No evidence of pulmonary infiltrate or pleural effusion. Mild posterior pleural thickening in the inferior right hemithorax is unchanged. Upper Abdomen: Stable diffuse hepatic steatosis. 1.4 cm left adrenal mass is also unchanged. Musculoskeletal: Stable small sclerotic bone lesions involving the right lateral 2nd rib and left posterior 10th rib are unchanged compared to multiple prior exams. No other suspicious bone lesions identified. IMPRESSION: Stable 7 x 13 mm pulmonary nodule in right lower lobe posterior costophrenic sulcus. Recommend continued followup by CT in 6 months. No new or progressive disease within the thorax. Stable mild right posterior pleural thickening. Stable hepatic steatosis and small benign left adrenal adenoma. Electronically Signed   By: JEarle GellM.D.   On: 09/30/2017 15:49     ASSESSMENT: 67y.o. Barbara Parks,  woman with stage IV breast cancer, on BOLERO-4 trial  (1) status post right mastectomy and sentinel lymph node sampling 03/22/2004 for a right upper-outer quadrant pT2 pN1, stage IIB invasive ductal carcinoma with lobular features, grade 2, estrogen receptor and progesterone receptor positive, HER-2 negative, with an MIB-1 of 9% (([id number]  (2) addition all right axillary lymph node sampling 05/07/2004 showed 2 benign lymph nodes (4 lymph nodes previously removed, so total was one positive lymph node out of 6; [id number])  (3) the patient was evaluated by radiation oncology; no postmastectomy radiation recommended  (4) adjuvant chemotherapy  with dose dense doxorubicin and cyclophosphamide x4 cycles (first cycle delayed one week) followed by dose dense paclitaxel x4 was completed 09/18/2004  (5) tamoxifen started March 2006, discontinued 2009  METASTATIC DISEASE: February 2013 (6) presenting with a large pericardial effusion, large right pleural effusion and possible right middle lobe bronchial obstruction February 2013, status post pericardial window placement, fiberoptic bronchoscopy and right Pleurx placement 09/11/2011, with biopsy of the bronchus intermedius and pericardium positive for metastatic breast cancer, estrogen receptor 91% positive with moderate staining intensity, progesterone receptor 100% positive with strong staining intensity, with an MIB-1 of 35%, and no HER-2 amplification, the signals ratio being 1.37 ([id number])  (7) enrolled in BOLERO-4 trial 10/10/2011, receiving letrozole and everolimus  (a) two small areas of enhancement in the cerebellum noted by brain MRI 10/03/2011 were no longer apparent on repeat MRI 08/21/2012-- most recent brain MRI 04/01/2014 showed no evidence of intracranial metastatic disease  (b) sclerotic lesions in left iliac bone and sacrum have not been biopsied; stable; plan is to start zolendronate after patient updates her dental care (extraction planned)  (c) RLL lung nodule, stable (rescanned 07/12/2014 and 08/11/2014) R hilar and subcarinal lymph nodes: stable  (9) additional problems:  (a) hepatic steatosis  (b) COPD/ emphysema/ asthma  (c) advanced L hip osteoarthritis, status post left total hip replacement 06/07/2015  (  d) aortoiliac atherosclerosis  (e) dental evaluation pending w possible dental extractions  (f) possible thalassemia trait  (10) Bone density concerns: DEXA scan 08/11/2013 was normal  (11) likely thalassemia trait  (12) hepatic steatosis.  PLAN: Barbara Parks is now 6 years out from definitive diagnosis of metastatic breast cancer, with no evidence of active  disease.  This is very favorable.  She continues to tolerate the letrozole and everolimus well and she continues to show clinical benefit from this treatment.  The plan is to continue it.  She does have a slight increase in her blood sugar.  We are going to do dietary changes and increased exercise to see if we can deal with that that way.  We will repeat the glucose but also add a hemoglobin A1c when she returns for the next visit.  We also discussed her cholesterol.  She is already on Lopid and Crestor.  We will follow this as well at the next visit but right now I am not increasing the dose of Crestor for her  She will see me again in 3 months.  We will repeat a CT of the chest in 6 months.  She will also have a left mammogram in August  She knows to call for any other issues that may develop before the next visit.  Symon Norwood, Virgie Dad, MD  10/02/17 10:54 AM Medical Oncology and Hematology [facility] [address] [place], [place] [address] Tel. [phone]    Fax. [phone]  This document serves as a record of services personally performed by Lurline Del, MD. It was created on his behalf by Sheron Nightingale, a trained medical scribe. The creation of this record is based on the scribe's personal observations and the provider's statements to them.   I have reviewed the above documentation for accuracy and completeness, and I agree with the above.

## 2017-10-02 ENCOUNTER — Other Ambulatory Visit: Payer: Self-pay | Admitting: Oncology

## 2017-10-02 ENCOUNTER — Encounter: Payer: Self-pay | Admitting: *Deleted

## 2017-10-02 ENCOUNTER — Telehealth: Payer: Self-pay | Admitting: Oncology

## 2017-10-02 ENCOUNTER — Inpatient Hospital Stay (HOSPITAL_BASED_OUTPATIENT_CLINIC_OR_DEPARTMENT_OTHER): Payer: Medicare Other | Admitting: Oncology

## 2017-10-02 VITALS — BP 122/54 | HR 88 | Temp 97.8°F | Resp 18 | Ht 64.0 in | Wt 160.4 lb

## 2017-10-02 DIAGNOSIS — C7800 Secondary malignant neoplasm of unspecified lung: Principal | ICD-10-CM

## 2017-10-02 DIAGNOSIS — K760 Fatty (change of) liver, not elsewhere classified: Secondary | ICD-10-CM | POA: Diagnosis not present

## 2017-10-02 DIAGNOSIS — Z170 Estrogen receptor positive status [ER+]: Secondary | ICD-10-CM | POA: Diagnosis not present

## 2017-10-02 DIAGNOSIS — C50911 Malignant neoplasm of unspecified site of right female breast: Secondary | ICD-10-CM

## 2017-10-02 DIAGNOSIS — C50411 Malignant neoplasm of upper-outer quadrant of right female breast: Secondary | ICD-10-CM | POA: Diagnosis not present

## 2017-10-02 DIAGNOSIS — M899 Disorder of bone, unspecified: Secondary | ICD-10-CM | POA: Diagnosis not present

## 2017-10-02 DIAGNOSIS — I313 Pericardial effusion (noninflammatory): Secondary | ICD-10-CM

## 2017-10-02 DIAGNOSIS — E7800 Pure hypercholesterolemia, unspecified: Secondary | ICD-10-CM

## 2017-10-02 DIAGNOSIS — R739 Hyperglycemia, unspecified: Secondary | ICD-10-CM | POA: Diagnosis not present

## 2017-10-02 DIAGNOSIS — C773 Secondary and unspecified malignant neoplasm of axilla and upper limb lymph nodes: Secondary | ICD-10-CM

## 2017-10-02 DIAGNOSIS — C801 Malignant (primary) neoplasm, unspecified: Secondary | ICD-10-CM

## 2017-10-02 DIAGNOSIS — Z79811 Long term (current) use of aromatase inhibitors: Secondary | ICD-10-CM

## 2017-10-02 DIAGNOSIS — Z006 Encounter for examination for normal comparison and control in clinical research program: Secondary | ICD-10-CM

## 2017-10-02 DIAGNOSIS — I3131 Malignant pericardial effusion in diseases classified elsewhere: Secondary | ICD-10-CM

## 2017-10-02 NOTE — Progress Notes (Signed)
10/02/17 at 1:17pm- [employer]/Bolero 4 - Extension Phase #9 visit  The patientwasintotheclinicthis morningunaccompanied forherroutine 12-week follow-up visit and study drug dispensing. Patient returned 6 kits of everolimus with2tablets unopened.  The pt's overall 12 week study drug compliance is 98.8% for the date range of 07/10/2017-10/01/2017. Patient returned paper calendars documenting daily doses of letrozole and everolimus with 1 daily dose missed on 08/28/2017. Medication was returned to the pharmacy for drug accountability by pharmacist, Raul Del. The pt said that she is feeling well and has been exercising more lately.  Patient reports the previously noted AE's (fatigue, hot flashes, dry skin, back pain, and memory impairment) as ongoingand unchanged in severity.  Patient reports her blurry vision in her left eye is still ongoing.   She has not yet scheduled an appointment with her ophthalmologist for additional follow up. She denies any current problems with nausea and heartburn.  Based on lab results review, CT chest scans, and history and physical byDr. Jana Hakim, the patient's condition is acceptable for continued treatment.   In his opinion, she is still receiving benefit from her study treatment.  He reviewed with the pt her lipid panel and her elevated glucose level.  He told the pt that he did not want to start her on any diabetic medications at this time.  He also stated that he did not want to increase her Crestor dose.  He strongly encouraged the pt to limit her sugar and carbohydrate intake.  The pt said that she is very motivated to make dietary changes for her well-being.  Dr. Jana Hakim said that he will monitor the pt's lipid and glucose levels closely and make any changes necessary in the future.    Six kits of study drug everolimus were dispensed today for the next 12 weeks supply. In addition, patient will continue commercial supply of letrozole 2.5mg  daily. The next  study visit is expected to occur on5/30/2019. The research nurse thankedthepatient for her continued participation in the study.  Brion Aliment RN, BSN, [place] Clinical Research Nurse  Adverse Event Log Protocol: [employer] [id number] Cycle: Extension Phase #8(AE date range12/14/18-10/02/17) Ongoing and unchanged in severity:fatigue, hot flashes (mild), memory impairment, dry skin, and back pain  Event Grade Onset Date Resolved Date Attribution to letrozole Attribution to everolimus  Blurred vision, left eye 1 06/28/2016 Ongoing Not suspected Not suspected  Alkaline phosphatase increased 1 08/14/2012 Ongoing Not suspected Suspected  AST increased 1 11/30/2015 Ongoing Not suspected Suspected  Albumin decreased 1 04/17/2017 09/30/17 Not suspected Suspected  Hypercalcemia 1 09/30/17 Onging Not suspected Not suspected  ALT increased  1 09/30/17 Ongoing  Not Suspected  Suspected  Hyperglycemia  2 09/30/17 Ongoing Not Suspected Suspected   Hypertriglyceridemia  2 09/30/17 Ongoing  Not Suspected  Not Suspected

## 2017-10-02 NOTE — Telephone Encounter (Signed)
Patient declined AVS and calendar of upcoming May appointments. Will receive update in mychart.

## 2017-10-03 ENCOUNTER — Other Ambulatory Visit: Payer: Self-pay | Admitting: Oncology

## 2017-10-03 MED ORDER — INV-EVEROLIMUS (RAD0001) 5MG TABLET NOVARTIS CRAD001Y24135: 2.0000 | ORAL_TABLET | Freq: Every day | ORAL | 0 refills | Status: DC

## 2017-10-03 NOTE — Progress Notes (Signed)
[facility]  Telephone:([phone]) [phone] Fax:(336) [phone]     ID: Barbara Parks DOB: December 30, 1950  MR#: [id number]  NTI#:[id number]  Patient Care Team: Barbara Austin, MD as PCP - General (Family Medicine) Barbara Pain, MD (Cardiology) Barbara Pollack, MD (Cardiothoracic Surgery) Parks, Barbara Dad, MD as Consulting Physician (Oncology) OTHER MD: Dr Barbara Parks- Barbara Parks, DDS; Barbara Arabian MD  CHIEF COMPLAINT: metastatic breast cancer, on BOLERO study  CURRENT TREATMENT: letrozole + everolimus  BREAST CANCER HISTORY: From Dr. Collier Salina Parks's original intake note 04/13/2004:  "This woman has been in good health all of her life. She recently moved from [place] to work here.  She palpated a mass at about the 12 o'clock position in mid-July. She has not noticed any nipple retraction or skin changes.  She was seen by her primary care doctor who subsequently referred her for a mammogram.  Mammogram was performed on 03/01/04. This demonstrated a spiculated 2 cm mass at the 12 o'clock position in the right breast.  Upper outer quadrant of the left breast shows some distortion.   Physical exam at that time showed a firm, nontender nodule at the 12 o'clock position in the right breast, 5 cm from the nipple.  Ultrasound of this area showed a hypoechoic ill-defined mass, measuring 2.2 x 1.3 x 1.4 cm.  Physical exam of the left breast showed general vague thickening, upper outer quadrant of the left breast with a discrete palpable mass. The ultrasound performed showed a single hypoechoic ill-defined nodule at the 1 o'clock position, measuring 7 x 5 x 6 mm.  She had biopsies of both lesions on 03/02/03.  Needle core biopsy of the lesion on the right breast revealed invasive mammary carcinoma.  Needle core biopsy of the left breast showed a complex fibroadenoma.  Prognostic panel of the lesion on the right breast showed it to be ER positive at 73%, PR positive at 90% and proliferative index 9%,  HER-2 was 1+.  Patient was referred to Dr. Annamaria Parks, who performed a simple mastectomy with sentinel lymph node evaluation on 03/22/04.  Final pathology showed this to be an invasive ductal carcinoma  with lobular features, measuring 2.2 cm, grade 2 of 3.  Margins negative for carcinoma.  Invasive ductal carcinoma was extended to involve deep dermis of the nipple.  Lymphovascular invasion was identified.  Total of 4 sentinel lymph nodes were evaluated.  Touch imprints at the time of the OR was felt to be negative.  Subsequent evaluation showed a 5 mm focus of metastatic carcinoma in one of the four lymph nodes on microscopic after sectioning.  There was extracapsular extension  of one lymph node as well. The remaining three lymph nodes were all negative."  The patient's subsequent history is detailed below.  INTERVAL HISTORY: Barbara Parks returns today for follow-up and treatment of her metastatic estrogen receptor positive breast cancer. She continues on the [facility] trial this being her extension face visit . She tolerates letrozole well. She has some hot flashes but this isn't a major issue for her. She denies issues with vaginal dryness. She takes it with food, which has helped prevent nausea.   In addition to letrozole, she takes everolimus, 5 mg daily. She has had some stomach issues. She also feels more fatigued.  She has had no evidence of mucositis or pneumonitis on the.  She completed a chest CT on 09/30/2017 showing: Stable 7 x 13 mm pulmonary nodule in right lower lobe posterior costophrenic sulcus. No new or  progressive disease within the thorax. Stable mild right posterior pleural thickening. Stable hepatic steatosis and small benign left adrenal adenoma.   REVIEW OF SYSTEMS: Barbara Parks reports that she has been walking more when the weather is nice. She is doing resistance and light weight lift training. She also walks her dog. She denies unusual headaches, visual changes, nausea, vomiting, or dizziness.  There has been no unusual cough, phlegm production, or pleurisy. This been no change in bowel or bladder habits. She denies unexplained fatigue or unexplained weight loss, bleeding, rash, or fever. A detailed review of systems was otherwise stable.    PAST MEDICAL HISTORY: Past Medical History:  Diagnosis Date  . Arthritis    left hip  . Asthma   . breast ca 2005   breast/chemo R mastectomy  . GERD (gastroesophageal reflux disease)   . Hypercholesteremia   . Metastasis to lung ([place]) dx'd 08/2011  . Osteopenia due to cancer therapy 09/09/2013  . Palpitations   . Shortness of breath     PAST SURGICAL HISTORY: Past Surgical History:  Procedure Laterality Date  . BREAST SURGERY  2005   right  . CATARACT EXTRACTION W/PHACO Left 01/18/2014   Procedure: CATARACT EXTRACTION PHACO AND INTRAOCULAR LENS PLACEMENT (IOC);  Surgeon: Barbara Guadeloupe T. Gershon Crane, MD;  Location: [facility];  Service: Ophthalmology;  Laterality: Left;  CDE 15.79  . CATARACT EXTRACTION W/PHACO Right 02/08/2014   Procedure: CATARACT EXTRACTION PHACO AND INTRAOCULAR LENS PLACEMENT (IOC);  Surgeon: Barbara Guadeloupe T. Gershon Crane, MD;  Location: [facility];  Service: Ophthalmology;  Laterality: Right;  CDE 4.41  . CHEST TUBE INSERTION  09/11/2011   Procedure: INSERTION PLEURAL DRAINAGE CATHETER;  Surgeon: Barbara Pollack, MD;  Location: [facility];  Service: Thoracic;  Laterality: Right;  . PERICARDIAL WINDOW  09/11/2011   Procedure: PERICARDIAL WINDOW;  Surgeon: Barbara Pollack, MD;  Location[facility];  Service: Thoracic;  Laterality: N/A;  . REMOVAL OF PLEURAL DRAINAGE CATHETER  12/19/2011   Procedure: REMOVAL OF PLEURAL DRAINAGE CATHETER;  Surgeon: Barbara Pollack, MD;  Location: [place];  Service: Thoracic;  Laterality: Right;  TO BE DONE IN MINOR ROOM, SHORT STAY  . [place]  . TOTAL HIP ARTHROPLASTY Left 06/07/2015   Procedure: LEFT TOTAL HIP ARTHROPLASTY ANTERIOR APPROACH;  Surgeon: Barbara Arabian, MD;  Location: [facility];  Service: Orthopedics;  Laterality:  Left;  Marland Kitchen VIDEO BRONCHOSCOPY  09/11/2011   Procedure: VIDEO BRONCHOSCOPY;  Surgeon: Barbara Pollack, MD;  Location[facility] OR;  Service: Thoracic;  Laterality: N/A;    FAMILY HISTORY Family History  Problem Relation Age of Onset  . Cancer Mother        breast  . Heart disease Father   . Anesthesia problems Neg Hx    the patient's mother was diagnosed with breast cancer at age 76, she died age 74 from congestive heart failure..The patient's father died from heart disease at age 57.  She has one sister alive & well.  Two brothers alive & well, one with diabetes. The patient's sister was also diagnosed with breast cancer, and was tested for the BRCA gene, and was negative. The patient herself has not been tested. There is no history of ovarian cancer in the family  GYNECOLOGIC HISTORY:  No LMP recorded. Patient is postmenopausal. Menarche age 78, the patient is GX P0. She stopped having periods with chemotherapy in 2005. 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 She was a Archivist. She is single, lives alone with her Shitzu-poodle Sammie.. Family is all in the [place] area.  Note her brother is currently under treatment as discussed today    ADVANCED DIRECTIVES: Not in place. At the 02/23/2014 visit the patient was given the appropriate documents to complete and notarize at her discretion. She tells me she is planning to name her sister, Billie Ruddy, as healthcare power of attorney. [name] can be reached at [phone]   HEALTH MAINTENANCE: Social History   Tobacco Use  . Smoking status: Former Smoker    Packs/day: 1.50    Years: 30.00    Pack years: 45.00    Types: Cigarettes    Last attempt to quit: 09/10/2007    Years since quitting: 10.0  . Smokeless tobacco: Never Used  Substance Use Topics  . Alcohol use: No  . Drug use: No     Colonoscopy:  PAP:  Bone  density: 08/14/2015, T score -1.6  Lipid panel:  Allergies  Allergen Reactions  . Aspirin     REACTION: upset stomach  . Latex Other (See Comments)    Blistering and skin peels off  . Nsaids Nausea And Vomiting    Extreme nausea and vomiting    Current Outpatient Medications  Medication Sig Dispense Refill  . cholecalciferol (VITAMIN D) 1000 units tablet Take 1,000 Units by mouth 2 (two) times daily.    Marland Kitchen gemfibrozil (LOPID) 600 MG tablet TAKE 1 TABLET BY MOUTH TWICE DAILY BEFORE MEAL(S) 180 tablet 0  . Investigational everolimus (RAD001) 5 MG tablet [employer]is [id number] Take 2 tablets by mouth daily. Take with a glass of water. 168 tablet 0  . letrozole (FEMARA) 2.5 MG tablet TAKE 1 TABLET (2.5 MG TOTAL) BY MOUTH DAILY. 90 tablet 3  . loperamide (IMODIUM) 2 MG capsule Take 2 mg by mouth 4 (four) times daily as needed for diarrhea or loose stools. Reported on 11/30/2015    . metoprolol tartrate (LOPRESSOR) 25 MG tablet Take 0.5 tablets (12.5 mg total) by mouth daily. 30 tablet 4  . omeprazole (PRILOSEC) 40 MG capsule Take 1 capsule (40 mg total) by mouth daily as needed. 90 capsule 3  . ondansetron (ZOFRAN) 8 MG tablet Take 1 tablet (8 mg total) by mouth 2 (two) times daily as needed (Nausea or vomiting). 30 tablet 1  . prochlorperazine (COMPAZINE) 25 MG suppository Place 1 suppository (25 mg total) rectally every 12 (twelve) hours as needed for nausea. 12 suppository 3  . rosuvastatin (CRESTOR) 5 MG tablet Take 1 tablet (5 mg total) by mouth daily. 30 tablet 6   No current facility-administered medications for this visit.     OBJECTIVE: Middle-aged white woman in no acute distress  There were no vitals filed for this visit.   There is no height or weight on file to calculate BMI.    ECOG FS:0 - Asymptomatic  Sclerae unicteric, EOMs intact Oropharynx clear and moist No cervical or supraclavicular adenopathy Lungs no rales or rhonchi Heart regular rate and rhythm Abd soft,  nontender, positive bowel sounds MSK no focal spinal tenderness, no upper extremity lymphedema Neuro: nonfocal, well oriented, appropriate affect Breasts: She has undergone right mastectomy.  There is no evidence of chest wall recurrence.  The left breast is benign.  Both axillae are benign.  LAB  RESULTS:  CMP     Component Value Date/Time   NA 137 09/30/2017 0949   NA 139 07/08/2017  0942   K 4.3 09/30/2017 0949   K 3.8 07/08/2017 0942   CL 104 09/30/2017 0949   CL 109 (H) 12/31/2012 0940   CO2 22 09/30/2017 0949   CO2 18 (L) 07/08/2017 0942   GLUCOSE 209 (H) 09/30/2017 0949   GLUCOSE 156 (H) 07/08/2017 0942   GLUCOSE 127 (H) 12/31/2012 0940   BUN 16 09/30/2017 0949   BUN 17.7 07/08/2017 0942   CREATININE 0.99 09/30/2017 0949   CREATININE 1.0 07/08/2017 0942   CALCIUM 10.5 (H) 09/30/2017 0949   CALCIUM 10.4 07/08/2017 0942   PROT 8.3 09/30/2017 0949   PROT 7.7 07/08/2017 0942   ALBUMIN 3.5 09/30/2017 0949   ALBUMIN 3.4 (L) 07/08/2017 0942   AST 77 (H) 09/30/2017 0949   AST 54 (H) 07/08/2017 0942   ALT 59 (H) 09/30/2017 0949   ALT 39 07/08/2017 0942   ALKPHOS 246 (H) 09/30/2017 0949   ALKPHOS 232 (H) 07/08/2017 0942   BILITOT 0.3 09/30/2017 0949   BILITOT 0.42 07/08/2017 0942   GFRNONAA 58 (L) 09/30/2017 0949   GFRAA >60 09/30/2017 0949    I No results found for: SPEP  Lab Results  Component Value Date   WBC 6.6 09/30/2017   NEUTROABS 4.6 09/30/2017   HGB 13.7 09/30/2017   HCT 41.5 09/30/2017   MCV 80.7 09/30/2017   PLT 294 09/30/2017      Chemistry      Component Value Date/Time   NA 137 09/30/2017 0949   NA 139 07/08/2017 0942   K 4.3 09/30/2017 0949   K 3.8 07/08/2017 0942   CL 104 09/30/2017 0949   CL 109 (H) 12/31/2012 0940   CO2 22 09/30/2017 0949   CO2 18 (L) 07/08/2017 0942   BUN 16 09/30/2017 0949   BUN 17.7 07/08/2017 0942   CREATININE 0.99 09/30/2017 0949   CREATININE 1.0 07/08/2017 0942      Component Value Date/Time   CALCIUM 10.5  (H) 09/30/2017 0949   CALCIUM 10.4 07/08/2017 0942   ALKPHOS 246 (H) 09/30/2017 0949   ALKPHOS 232 (H) 07/08/2017 0942   AST 77 (H) 09/30/2017 0949   AST 54 (H) 07/08/2017 0942   ALT 59 (H) 09/30/2017 0949   ALT 39 07/08/2017 0942   BILITOT 0.3 09/30/2017 0949   BILITOT 0.42 07/08/2017 0942       Lab Results  Component Value Date   LABCA2 58 (H) 09/14/2012    No components found for: [id number]  No results for input(s): INR in the last 168 hours.  Urinalysis    Component Value Date/Time   COLORURINE YELLOW 09/20/2015 1432   APPEARANCEUR HAZY (A) 09/20/2015 1432   LABSPEC >1.030 (H) 09/20/2015 1432   LABSPEC 1.030 07/13/2015 0841   PHURINE 5.5 09/20/2015 1432   GLUCOSEU 500 (A) 09/20/2015 1432   GLUCOSEU Negative 07/13/2015 0841   HGBUR MODERATE (A) 09/20/2015 1432   BILIRUBINUR NEGATIVE 09/20/2015 1432   BILIRUBINUR Negative 07/13/2015 0841   KETONESUR NEGATIVE 09/20/2015 1432   PROTEINUR 30 (A) 09/20/2015 1432   UROBILINOGEN 0.2 07/13/2015 0841   NITRITE NEGATIVE 09/20/2015 1432   LEUKOCYTESUR NEGATIVE 09/20/2015 1432   LEUKOCYTESUR Negative 07/13/2015 0841    STUDIES: Ct Chest W Contrast  Result Date: 09/30/2017 CLINICAL DATA:  Right breast carcinoma metastatic to lung. Undergoing oral chemotherapy. Restaging. EXAM: CT CHEST WITH CONTRAST TECHNIQUE: Multidetector CT imaging of the chest was performed during intravenous contrast administration. CONTRAST:  93m ISOVUE-300 IOPAMIDOL (ISOVUE-300) INJECTION 61% COMPARISON:  07/08/2017 FINDINGS: RECIST 1.0  Target Lesions: 1. 5 mm right lower lobe pulmonary nodule, image 100 of series 5, unchanged 2. 6 mm subcarinal lymph node, image 71 of series 2, unchanged Non-target Lesions: 1. Mild right posterior pleural thickening, unchanged Cardiovascular:  No acute findings. Mediastinum/Nodes: No masses or pathologically enlarged lymph nodes identified. 6 mm partially calcified subcarinal mediastinal lymph node on image 71/2 is stable.  Previous right mastectomy and axillary lymph node dissection, without axillary lymphadenopathy. Lungs/Pleura: 7 x 13 pulmonary nodule in the posterior right lower lobe is stable since previous study. A 5 mm peripheral pulmonary nodule also in the posterior right lower lobe on image 100/5 is also stable. No new or enlarging pulmonary nodules or masses identified. No evidence of pulmonary infiltrate or pleural effusion. Mild posterior pleural thickening in the inferior right hemithorax is unchanged. Upper Abdomen: Stable diffuse hepatic steatosis. 1.4 cm left adrenal mass is also unchanged. Musculoskeletal: Stable small sclerotic bone lesions involving the right lateral 2nd rib and left posterior 10th rib are unchanged compared to multiple prior exams. No other suspicious bone lesions identified. IMPRESSION: Stable 7 x 13 mm pulmonary nodule in right lower lobe posterior costophrenic sulcus. Recommend continued followup by CT in 6 months. No new or progressive disease within the thorax. Stable mild right posterior pleural thickening. Stable hepatic steatosis and small benign left adrenal adenoma. Electronically Signed   By: Earle Gell M.D.   On: 09/30/2017 15:49     ASSESSMENT: 67 y.o. Barbara Parks, [place] woman with stage IV breast cancer, on BOLERO-4 trial  (1) status post right mastectomy and sentinel lymph node sampling 03/22/2004 for a right upper-outer quadrant pT2 pN1, stage IIB invasive ductal carcinoma with lobular features, grade 2, estrogen receptor and progesterone receptor positive, HER-2 negative, with an MIB-1 of 9% ([id number] and [id number])  (2) addition all right axillary lymph node sampling 05/07/2004 showed 2 benign lymph nodes (4 lymph nodes previously removed, so total was one positive lymph node out of 6; [id number])  (3) the patient was evaluated by radiation oncology; no postmastectomy radiation recommended  (4) adjuvant chemotherapy with dose dense doxorubicin and cyclophosphamide x4 cycles  (first cycle delayed one week) followed by dose dense paclitaxel x4 was completed 09/18/2004  (5) tamoxifen started March 2006, discontinued 2009  METASTATIC DISEASE: February 2013 (6) presenting with a large pericardial effusion, large right pleural effusion and possible right middle lobe bronchial obstruction February 2013, status post pericardial window placement, fiberoptic bronchoscopy and right Pleurx placement 09/11/2011, with biopsy of the bronchus intermedius and pericardium positive for metastatic breast cancer, estrogen receptor 91% positive with moderate staining intensity, progesterone receptor 100% positive with strong staining intensity, with an MIB-1 of 35%, and no HER-2 amplification, the signals ratio being 1.37 ([id number])  (7) enrolled in BOLERO-4 trial 10/10/2011, receiving letrozole and everolimus  (a) two small areas of enhancement in the cerebellum noted by brain MRI 10/03/2011 were no longer apparent on repeat MRI 08/21/2012-- most recent brain MRI 04/01/2014 showed no evidence of intracranial metastatic disease  (b) sclerotic lesions in left iliac bone and sacrum have not been biopsied; stable; plan is to start zolendronate after patient updates her dental care (extraction planned)  (c) RLL lung nodule, stable (rescanned 07/12/2014 and 08/11/2014) R hilar and subcarinal lymph nodes: stable  (9) additional problems:  (a) hepatic steatosis  (b) COPD/ emphysema/ asthma  (c) advanced L hip osteoarthritis, status post left total hip replacement 06/07/2015  (d) aortoiliac atherosclerosis  (e) dental evaluation pending w possible dental extractions  (f)  possible thalassemia trait  (10) Bone density concerns: DEXA scan 08/11/2013 was normal  (11) likely thalassemia trait  (12) hepatic steatosis.  PLAN: Barbara Parks is now 6 years out from definitive diagnosis of metastatic breast cancer, with no evidence of active disease.  This is very favorable.  She continues to tolerate  the letrozole and everolimus well and she continues to show clinical benefit from this treatment.  The plan is to continue it.  She does have a slight increase in her blood sugar.  We are going to do dietary changes and increased exercise to see if we can deal with that that way.  We will repeat the glucose but also add a hemoglobin A1c when she returns for the next visit.  We also discussed her cholesterol.  She is already on Lopid and Crestor.  We will follow this as well at the next visit but right now I am not increasing the dose of Crestor for her  She will see me again in 3 months.  We will repeat a CT of the chest in 6 months.  She will also have a left mammogram in August  She knows to call for any other issues that may develop before the next visit.  Parks, Barbara Dad, MD  10/03/17 4:31 PM Medical Oncology and Hematology [facility] [name] [place], [place] [address] Tel. [phone]    Fax. [phone]  This document serves as a record of services personally performed by Lurline Del, MD. It was created on his behalf by Sheron Nightingale, a trained medical scribe. The creation of this record is based on the scribe's personal observations and the provider's statements to them.   I have reviewed the above documentation for accuracy and completeness, and I agree with the above.   NOTE: the patient is taking everolimus 10 mg daily, not 5 mg as stated above.

## 2017-10-31 ENCOUNTER — Other Ambulatory Visit: Payer: Self-pay | Admitting: Oncology

## 2017-10-31 DIAGNOSIS — C7800 Secondary malignant neoplasm of unspecified lung: Principal | ICD-10-CM

## 2017-10-31 DIAGNOSIS — C50911 Malignant neoplasm of unspecified site of right female breast: Secondary | ICD-10-CM

## 2017-11-13 ENCOUNTER — Other Ambulatory Visit: Payer: Self-pay | Admitting: Oncology

## 2017-11-18 DIAGNOSIS — R399 Unspecified symptoms and signs involving the genitourinary system: Secondary | ICD-10-CM | POA: Diagnosis not present

## 2017-11-18 DIAGNOSIS — I700 Atherosclerosis of aorta: Secondary | ICD-10-CM | POA: Diagnosis not present

## 2017-11-18 DIAGNOSIS — J449 Chronic obstructive pulmonary disease, unspecified: Secondary | ICD-10-CM | POA: Diagnosis not present

## 2017-11-18 DIAGNOSIS — N3000 Acute cystitis without hematuria: Secondary | ICD-10-CM | POA: Diagnosis not present

## 2017-11-18 DIAGNOSIS — C7951 Secondary malignant neoplasm of bone: Secondary | ICD-10-CM | POA: Diagnosis not present

## 2017-11-24 ENCOUNTER — Encounter: Payer: Self-pay | Admitting: *Deleted

## 2017-11-24 ENCOUNTER — Other Ambulatory Visit: Payer: Self-pay | Admitting: *Deleted

## 2017-11-24 DIAGNOSIS — R739 Hyperglycemia, unspecified: Secondary | ICD-10-CM

## 2017-11-24 NOTE — Progress Notes (Signed)
11/24/17 at 11:12am- The pt stated that she developed some UTI-like symptoms last week.  The pt said that she went to her primary care physician (Dr. Justin Mend), and she was told that she did not have a UTI.  The pt said that she was told that she probably has diabetes.  The pt said that she refused a diabetic work-up at her [facility] PCP office.  She said that she wanted to be seen and evaluated here at [facility] since she felt that her diabetes could be related to her study drug, everolimus.  The pt said that she also has diabetes in her family(mother and brother).  Dr. Jana Hakim was informed about the pt's symptoms and request to be seen here.  He asked that the pt come here this week for labs and to be evaluated.  The pt will be seen by Sandi Mealy, NP tomorrow.  The pt was informed to come to the cancer center fasting tomorrow morning for her 9am labs and to see the NP at 9:30am.  The pt thanked the nurse for getting her seen so quickly.  The pt said that she is watching what she eats and trying to follow a diabetic diet.   Brion Aliment RN, BSN, CCRP Clinical Research Nurse 11/24/2017 11:22 AM   11/25/17 at 11:48am- The pt was into the cancer center this morning.  The pt's fasting glucose was 210.  The pt's urinalysis was stable.  The pt was seen and evaluated by Sandi Mealy, physician assistant.  The pt was told that she has diabetes.  The pt said that she felt that she had diabetes because so many family members have dealt with this condition.  The pt said that she has made several dietary modifications lately, and she wants to try to control it with her diet choices.  The pt was told that she may need to start on some medication soon to better control her symptoms.  The pt said that she has had some fatigue/weakness and frequent urination since last Friday.  The pt also states some dizziness at times.  These symtoms are all felt to be related to the pt's elevated glucose level.  The pt has lost 4 lbs since her last  visit in March.  The pt said that she no longer takes the following medications:  Vitamin D, Lopressor, Prilosec, and compazine.  The pt said that she takes her study treatment (everolimus + letrozole), lopid, immodium prn, and crestor on a regular basis.  The PA ordered the pt a glucose meter to check her sugars on a more regular basis.  The pt was also referred to Dory Peru, dietician, for nutrition counseling.  The pt was told to contact her PCP's office and notify them of her new diagnosis.  The pt was in agreement with her plan.  The pt is aware of her follow up appointment on May 30th. The research nurse reviewed the protocol regarding the pt's grade 2 hyperglycemia.  The protocol states "no specific dose reductions are needed"for hyperglycemia.  The protocol states "treat according to the best clinical practice".  The pt was told to contact Dr. Jana Hakim or her PCP for any worsening symptoms.   Brion Aliment RN, BSN, CCRP Clinical Research Nurse 11/25/2017 11:58 AM

## 2017-11-25 ENCOUNTER — Inpatient Hospital Stay: Payer: Medicare Other | Attending: Oncology | Admitting: Medical

## 2017-11-25 ENCOUNTER — Inpatient Hospital Stay: Payer: Medicare Other

## 2017-11-25 VITALS — BP 134/80 | HR 89 | Temp 98.1°F | Resp 17 | Ht 64.0 in | Wt 156.6 lb

## 2017-11-25 DIAGNOSIS — E118 Type 2 diabetes mellitus with unspecified complications: Secondary | ICD-10-CM

## 2017-11-25 DIAGNOSIS — C50411 Malignant neoplasm of upper-outer quadrant of right female breast: Secondary | ICD-10-CM

## 2017-11-25 DIAGNOSIS — C50911 Malignant neoplasm of unspecified site of right female breast: Secondary | ICD-10-CM | POA: Insufficient documentation

## 2017-11-25 DIAGNOSIS — Z794 Long term (current) use of insulin: Secondary | ICD-10-CM | POA: Insufficient documentation

## 2017-11-25 DIAGNOSIS — E1165 Type 2 diabetes mellitus with hyperglycemia: Secondary | ICD-10-CM | POA: Diagnosis not present

## 2017-11-25 DIAGNOSIS — Z79811 Long term (current) use of aromatase inhibitors: Secondary | ICD-10-CM | POA: Diagnosis not present

## 2017-11-25 DIAGNOSIS — I313 Pericardial effusion (noninflammatory): Secondary | ICD-10-CM

## 2017-11-25 DIAGNOSIS — C7800 Secondary malignant neoplasm of unspecified lung: Secondary | ICD-10-CM | POA: Insufficient documentation

## 2017-11-25 DIAGNOSIS — R739 Hyperglycemia, unspecified: Secondary | ICD-10-CM

## 2017-11-25 DIAGNOSIS — I3131 Malignant pericardial effusion in diseases classified elsewhere: Secondary | ICD-10-CM

## 2017-11-25 DIAGNOSIS — Z170 Estrogen receptor positive status [ER+]: Principal | ICD-10-CM

## 2017-11-25 DIAGNOSIS — C801 Malignant (primary) neoplasm, unspecified: Secondary | ICD-10-CM

## 2017-11-25 LAB — HEMOGLOBIN A1C
HEMOGLOBIN A1C: 9.9 % — AB (ref 4.8–5.6)
Mean Plasma Glucose: 237.43 mg/dL

## 2017-11-25 LAB — URINALYSIS, COMPLETE (UACMP) WITH MICROSCOPIC
Bacteria, UA: NONE SEEN
Bilirubin Urine: NEGATIVE
Glucose, UA: NEGATIVE mg/dL
Ketones, ur: NEGATIVE mg/dL
Leukocytes, UA: NEGATIVE
Nitrite: NEGATIVE
PROTEIN: 100 mg/dL — AB
SPECIFIC GRAVITY, URINE: 1.017 (ref 1.005–1.030)
pH: 5 (ref 5.0–8.0)

## 2017-11-25 LAB — GLUCOSE, RANDOM: GLUCOSE: 210 mg/dL — AB (ref 70–140)

## 2017-11-25 MED ORDER — BLOOD GLUCOSE MONITOR KIT: PACK | 0 refills | Status: DC

## 2017-11-25 NOTE — Patient Instructions (Signed)
Hyperglycemia Hyperglycemia is when the sugar (glucose) level in your blood is too high. It may not cause symptoms. If you do have symptoms, they may include warning signs, such as:  Feeling more thirsty than normal.  Hunger.  Feeling tired.  Needing to pee (urinate) more than normal.  Blurry eyesight (vision). You may get other symptoms as it gets worse, such as:  Dry mouth.  Not being hungry (loss of appetite).  Fruity-smelling breath.  Weakness.  Weight gain or loss that is not planned. Weight loss may be fast.  A tingling or numb feeling in your hands or feet.  Headache.  Skin that does not bounce back quickly when it is lightly pinched and released (poor skin turgor).  Pain in your belly (abdomen).  Cuts or bruises that heal slowly. High blood sugar can happen to people who do or do not have diabetes. High blood sugar can happen slowly or quickly, and it can be an emergency. Follow these instructions at home: General instructions  Take over-the-counter and prescription medicines only as told by your doctor.  Do not use products that contain nicotine or tobacco, such as cigarettes and e-cigarettes. If you need help quitting, ask your doctor.  Limit alcohol intake to no more than 1 drink per day for nonpregnant women and 2 drinks per day for men. One drink equals 12 oz of beer, 5 oz of wine, or 1 oz of hard liquor.  Manage stress. If you need help with this, ask your doctor.  Keep all follow-up visits as told by your doctor. This is important. Eating and drinking  Stay at a healthy weight.  Exercise regularly, as told by your doctor.  Drink enough fluid, especially when you:  Exercise.  Get sick.  Are in hot temperatures.  Eat healthy foods, such as:  Low-fat (lean) proteins.  Complex carbs (complex carbohydrates), such as whole wheat bread or brown rice.  Fresh fruits and vegetables.  Low-fat dairy products.  Healthy fats.  Drink enough  fluid to keep your pee (urine) clear or pale yellow. If you have diabetes:  Make sure you know the symptoms of hyperglycemia.  Follow your diabetes management plan, as told by your doctor. Make sure you:  Take insulin and medicines as told.  Follow your exercise plan.  Follow your meal plan. Eat on time. Do not skip meals.  Check your blood sugar as often as told. Make sure to check before and after exercise. If you exercise longer or in a different way than you normally do, check your blood sugar more often.  Follow your sick day plan whenever you cannot eat or drink normally. Make this plan ahead of time with your doctor.  Share your diabetes management plan with people in your workplace, school, and household.  Check your urine for ketones when you are ill and as told by your doctor.  Carry a card or wear jewelry that says that you have diabetes. Contact a doctor if:  Your blood sugar level is higher than 240 mg/dL (13.3 mmol/L) for 2 days in a row.  You have problems keeping your blood sugar in your target range.  High blood sugar happens often for you. Get help right away if:  You have trouble breathing.  You have a change in how you think, feel, or act (mental status).  You feel sick to your stomach (nauseous), and that feeling does not go away.  You cannot stop throwing up (vomiting). These symptoms may be an   emergency. Do not wait to see if the symptoms will go away. Get medical help right away. Call your local emergency services (911 in the [place] Do not drive yourself to the hospital.  Summary  Hyperglycemia is when the sugar (glucose) level in your blood is too high.  High blood sugar can happen to people who do or do not have diabetes.  Make sure you drink enough fluids, eat healthy foods, and exercise regularly.  Contact your doctor if you have problems keeping your blood sugar in your target range. This information is not intended to replace advice given  to you by your health care provider. Make sure you discuss any questions you have with your health care provider. Document Released: 05/12/2009 Document Revised: 04/01/2016 Document Reviewed: 04/01/2016 Elsevier Interactive Patient Education  2017 Elsevier Inc.  

## 2017-11-25 NOTE — Progress Notes (Signed)
Pt presents today with frequent urination, increased thirst, and high blood sugar per recent lab work.  Pt was checked for UTI (negative) but was positive for glucose in her urine.  Reports wt loss, intermittent muscle weakness and lightheadedness, headaches, and extreme fatigue.  Pt taking Everolimus (oral chemo).  A&Ox4.

## 2017-11-26 NOTE — Progress Notes (Signed)
[facility] Progress Note   Barbara Parks [id number] [birth year] 67 y.o.  Barbara Parks is managed by Dr. Jana Hakim  Actively treated with chemotherapy: yes  Current Therapy: enrolled in BOLERO-4 trial 10/10/2011, receiving letrozole and everolimus  Assessment: Plan:    Type 2 diabetes mellitus with complication, without long-term current use of insulin (Barbara Parks) - Plan: Ambulatory referral to Nutrition and Diabetic E  Carcinoma of right breast metastatic to lung (Barbara Parks)   Type 2 diabetes mellitus: Glucose level returned today at 210 with a hemoglobin A1c returning at 9.9.  Barbara Parks would like to attempt to control her diabetes with diet and exercise.  She has been given a prescription for a glucometer.  A referral has been made to diabetic education and nutrition.  She will return for routine follow-up with Dr. Jana Hakim on 12/25/2017.  A copy of this note will be sent to her primary care provider Dr. Darcus Austin.  Carcinoma of the right breast with metastatic disease to the lung: Barbara Parks continues to be enrolled in [place] trial 10/10/2011, receiving letrozole and everolimus.  Clinical research staff [name] was with the patient today during her appointment.  According to review of the BOLERO-4 trial 10/10/2011, there is no reason to dose reduce or drop the patient from this clinical trial.  The patient can be managed with best practice for her diabetes.  Please see After Visit Summary for patient specific instructions.  Future Appointments  Date Time Provider [place]  12/25/2017 10:00 AM [facility] ONLY [facility] None  12/25/2017 10:30 AM Causey, Charlestine Massed, NP [facility] None    Orders Placed This Encounter  Procedures  . Glucose, random  . Ambulatory referral to Nutrition and Diabetic E       Subjective:   Patient ID:  Barbara Parks is a 67 y.o. (DOB 16-Nov-1950) female.  Chief Complaint:  Chief Complaint  Patient presents  with  . Hyperglycemia    HPI Barbara Parks is a 67 year old female with a history of metastatic breast cancer with metastatic disease to the lungs.  She is managed by Dr. Jana Hakim and continues to be enrolled in [place] trial 10/10/2011, receiving letrozole and everolimus.  The patient has been noted in her last several visits to have an elevated blood glucose.  She presents to the office today for discussion of this.  She has had recent polyuria, fatigue, lightheadedness, headaches, and muscle weakness.  medications: I have reviewed the patient's current medications.  Allergies:  Allergies  Allergen Reactions  . Aspirin     REACTION: upset stomach  . Latex Other (See Comments)    Blistering and skin peels off  . Nsaids Nausea And Vomiting    Extreme nausea and vomiting    Past Medical History:  Diagnosis Date  . Arthritis    left hip  . Asthma   . breast ca 2005   breast/chemo R mastectomy  . GERD (gastroesophageal reflux disease)   . Hypercholesteremia   . Metastasis to lung ([place]) dx'd 08/2011  . Osteopenia due to cancer therapy 09/09/2013  . Palpitations   . Shortness of breath     Past Surgical History:  Procedure Laterality Date  . BREAST SURGERY  2005   right  . CATARACT EXTRACTION W/PHACO Left 01/18/2014   Procedure: CATARACT EXTRACTION PHACO AND INTRAOCULAR LENS PLACEMENT (IOC);  Surgeon: Elta Guadeloupe T. Gershon Crane, MD;  Location: [facility];  Service: Ophthalmology;  Laterality: Left;  CDE 15.79  . CATARACT EXTRACTION  W/PHACO Right 02/08/2014   Procedure: CATARACT EXTRACTION PHACO AND INTRAOCULAR LENS PLACEMENT (IOC);  Surgeon: Elta Guadeloupe T. Gershon Crane, MD;  Location: [facility];  Service: Ophthalmology;  Laterality: Right;  CDE 4.41  . CHEST TUBE INSERTION  09/11/2011   Procedure: INSERTION PLEURAL DRAINAGE CATHETER;  Surgeon: Gaye Pollack, MD;  Location: [place];  Service: Thoracic;  Laterality: Right;  . PERICARDIAL WINDOW  09/11/2011   Procedure: PERICARDIAL WINDOW;  Surgeon: Gaye Pollack, MD;  Location[facility];  Service: Thoracic;  Laterality: N/A;  . REMOVAL OF PLEURAL DRAINAGE CATHETER  12/19/2011   Procedure: REMOVAL OF PLEURAL DRAINAGE CATHETER;  Surgeon: Gaye Pollack, MD;  Location: [place];  Service: Thoracic;  Laterality: Right;  TO BE DONE IN MINOR ROOM, SHORT STAY  . [place]  . TOTAL HIP ARTHROPLASTY Left 06/07/2015   Procedure: LEFT TOTAL HIP ARTHROPLASTY ANTERIOR APPROACH;  Surgeon: Gaynelle Arabian, MD;  Location: [facility];  Service: Orthopedics;  Laterality: Left;  Marland Kitchen VIDEO BRONCHOSCOPY  09/11/2011   Procedure: VIDEO BRONCHOSCOPY;  Surgeon: Gaye Pollack, MD;  [facility];  Service: Thoracic;  Laterality: N/A;    Family History  Problem Relation Age of Onset  . Cancer Mother        breast  . Heart disease Father   . Anesthesia problems Neg Hx     Social History   Socioeconomic History  . Marital status: Single    Spouse name: Not on file  . Number of children: Not on file  . Years of education: Not on file  . Highest education level: Not on file  Occupational History  . Not on file  Social Needs  . Financial resource strain: Not on file  . Food insecurity:    Worry: Not on file    Inability: Not on file  . Transportation needs:    Medical: Not on file    Non-medical: Not on file  Tobacco Use  . Smoking status: Former Smoker    Packs/day: 1.50    Years: 30.00    Pack years: 45.00    Types: Cigarettes    Last attempt to quit: 09/10/2007    Years since quitting: 10.2  . Smokeless tobacco: Never Used  Substance and Sexual Activity  . Alcohol use: No  . Drug use: No  . Sexual activity: Not Currently  Lifestyle  . Physical activity:    Days per week: Not on file    Minutes per session: Not on file  . Stress: Not on file  Relationships  . Social connections:    Talks on phone: Not on file    Gets together: Not on file    Attends religious service: Not on file    Active member of club or organization: Not on file     Attends meetings of clubs or organizations: Not on file    Relationship status: Not on file  . Intimate partner violence:    Fear of current or ex partner: Not on file    Emotionally abused: Not on file    Physically abused: Not on file    Forced sexual activity: Not on file  Other Topics Concern  . Not on file  Social History Narrative  . Not on file    Past Medical History, Surgical history, Social history, and Family history were reviewed and updated as appropriate.   Please see review of systems for further details on the patient's review from today.   Review of  Systems:  Review of Systems  Constitutional: Positive for fatigue. Negative for diaphoresis.  HENT: Negative for trouble swallowing.   Respiratory: Negative for cough, choking, chest tightness, shortness of breath and wheezing.   Cardiovascular: Negative for chest pain and palpitations.  Gastrointestinal: Negative for nausea and vomiting.  Genitourinary: Positive for frequency. Negative for dysuria.  Musculoskeletal: Negative for back pain.  Skin: Negative for color change and rash.  Neurological: Positive for weakness, light-headedness and headaches. Negative for dizziness.    Objective:   Physical Exam:  BP 134/80 (BP Location: Left Arm, Patient Position: Sitting)   Pulse 89   Temp 98.1 F (36.7 C) (Oral)   Resp 17   Ht 5\' 4"  (1.626 m)   Wt 156 lb 9.6 oz (71 kg)   SpO2 100%   BMI 26.88 kg/m  ECOG: 0  Physical Exam  Constitutional: No distress.  HENT:  Head: Normocephalic and atraumatic.  Neurological: She is alert. Coordination normal.  Skin: She is not diaphoretic.  Psychiatric: She has a normal mood and affect. Her behavior is normal. Judgment and thought content normal.    Lab Review:     Component Value Date/Time   NA 137 09/30/2017 0949   NA 139 07/08/2017 0942   K 4.3 09/30/2017 0949   K 3.8 07/08/2017 0942   CL 104 09/30/2017 0949   CL 109 (H) 12/31/2012 0940   CO2 22 09/30/2017 0949    CO2 18 (L) 07/08/2017 0942   GLUCOSE 210 (H) 11/25/2017 0900   GLUCOSE 156 (H) 07/08/2017 0942   GLUCOSE 127 (H) 12/31/2012 0940   BUN 16 09/30/2017 0949   BUN 17.7 07/08/2017 0942   CREATININE 0.99 09/30/2017 0949   CREATININE 1.0 07/08/2017 0942   CALCIUM 10.5 (H) 09/30/2017 0949   CALCIUM 10.4 07/08/2017 0942   PROT 8.3 09/30/2017 0949   PROT 7.7 07/08/2017 0942   ALBUMIN 3.5 09/30/2017 0949   ALBUMIN 3.4 (L) 07/08/2017 0942   AST 77 (H) 09/30/2017 0949   AST 54 (H) 07/08/2017 0942   ALT 59 (H) 09/30/2017 0949   ALT 39 07/08/2017 0942   ALKPHOS 246 (H) 09/30/2017 0949   ALKPHOS 232 (H) 07/08/2017 0942   BILITOT 0.3 09/30/2017 0949   BILITOT 0.42 07/08/2017 0942   GFRNONAA 58 (L) 09/30/2017 0949   GFRAA >60 09/30/2017 0949       Component Value Date/Time   WBC 6.6 09/30/2017 0949   RBC 5.14 09/30/2017 0949   HGB 13.7 09/30/2017 0949   HGB 14.1 07/08/2017 0942   HCT 41.5 09/30/2017 0949   HCT 42.9 07/08/2017 0942   PLT 294 09/30/2017 0949   PLT 283 07/08/2017 0942   MCV 80.7 09/30/2017 0949   MCV 81.1 07/08/2017 0942   MCH 26.7 09/30/2017 0949   MCHC 33.0 09/30/2017 0949   RDW 14.1 09/30/2017 0949   RDW 14.6 (H) 07/08/2017 0942   LYMPHSABS 1.3 09/30/2017 0949   LYMPHSABS 1.2 07/08/2017 0942   MONOABS 0.5 09/30/2017 0949   MONOABS 0.7 07/08/2017 0942   EOSABS 0.1 09/30/2017 0949   EOSABS 0.1 07/08/2017 0942   BASOSABS 0.0 09/30/2017 0949   BASOSABS 0.0 07/08/2017 0942   -------------------------------  Imaging from last 24 hours (if applicable):  Radiology interpretation: No results found.

## 2017-11-28 ENCOUNTER — Telehealth: Payer: Self-pay | Admitting: Medical

## 2017-11-28 NOTE — Telephone Encounter (Signed)
Scheduled Nutrition consult per 5/3 sch message - left message with appt date and time.

## 2017-12-05 ENCOUNTER — Encounter: Payer: Medicare Other | Admitting: Nutrition

## 2017-12-15 DIAGNOSIS — E119 Type 2 diabetes mellitus without complications: Secondary | ICD-10-CM | POA: Diagnosis not present

## 2017-12-18 ENCOUNTER — Other Ambulatory Visit: Payer: Self-pay

## 2017-12-18 ENCOUNTER — Emergency Department (HOSPITAL_COMMUNITY)
Admission: EM | Admit: 2017-12-18 | Discharge: 2017-12-18 | Disposition: A | Discharge status: Home / Self Care | Payer: Medicare Other | Attending: Emergency Medicine | Admitting: Emergency Medicine

## 2017-12-18 ENCOUNTER — Telehealth: Payer: Self-pay | Admitting: *Deleted

## 2017-12-18 ENCOUNTER — Encounter (HOSPITAL_COMMUNITY): Payer: Self-pay | Admitting: Emergency Medicine

## 2017-12-18 DIAGNOSIS — Z96642 Presence of left artificial hip joint: Secondary | ICD-10-CM | POA: Diagnosis not present

## 2017-12-18 DIAGNOSIS — L2389 Allergic contact dermatitis due to other agents: Secondary | ICD-10-CM | POA: Diagnosis not present

## 2017-12-18 DIAGNOSIS — R21 Rash and other nonspecific skin eruption: Secondary | ICD-10-CM | POA: Diagnosis not present

## 2017-12-18 DIAGNOSIS — Z87891 Personal history of nicotine dependence: Secondary | ICD-10-CM | POA: Diagnosis not present

## 2017-12-18 DIAGNOSIS — J45909 Unspecified asthma, uncomplicated: Secondary | ICD-10-CM | POA: Diagnosis not present

## 2017-12-18 DIAGNOSIS — Z79899 Other long term (current) drug therapy: Secondary | ICD-10-CM | POA: Insufficient documentation

## 2017-12-18 DIAGNOSIS — Z91040 Latex allergy status: Secondary | ICD-10-CM | POA: Diagnosis not present

## 2017-12-18 HISTORY — DX: Dermatitis, unspecified: L30.9

## 2017-12-18 MED ORDER — PREDNISONE 20 MG PO TABS: 40.0000 mg | ORAL_TABLET | Freq: Every day | ORAL | 0 refills | Status: AC

## 2017-12-18 MED ORDER — PREDNISONE 50 MG PO TABS
60.0000 mg | ORAL_TABLET | Freq: Once | ORAL | Status: AC
  Administered 2017-12-18: 14:00:00 60 mg via ORAL
  Filled 2017-12-18: qty 1

## 2017-12-18 NOTE — Discharge Instructions (Addendum)
You appear to have contact dermatitis secondary to the latex of the ice bag.  Please take steroid burst as prescribed.  Please pick up 1% hydrocortisone cream and apply to affected areas.  Please make sure to avoid the eyes and lips.  Please follow with your primary care provider this week.  Return for any lip swelling, difficulty swallowing, difficult to breathing or worsening symptoms.  Please take Benadryl as needed for itching.

## 2017-12-18 NOTE — ED Notes (Signed)
ED Provider at bedside. 

## 2017-12-18 NOTE — ED Provider Notes (Signed)
[facility] EMERGENCY DEPARTMENT Provider Note   CSN: [id number] Arrival date & time: 12/18/17  1228     History   Chief Complaint Chief Complaint  Patient presents with  . Fall    HPI Barbara Parks is a 67 y.o. female resents emergency department today for rash to the face.  Patient does note that she fell on Monday while she was walking her dog and tripped over the leash.  She denies any loss of conscious but does note that she has multiple abrasions to her chin.  She was seen by her PCP for this.  She notes that she was using ice packs that had latex in it which she is allergic to after the fall and feels she has a reaction.  Since that time she has been having redness and itching to areas of her face.  She notes she had some mild lip swelling initially after the fall after biting her lips but denies this after using the latex.  No difficulty swallowing, shortness of breath, abdominal cramping, vomiting or diarrhea.  Patient without any fevers at home.  No neurologic symptoms of the fall including but not limited to headache, vertigo, facial droop, visual changes, focal weakness, bowel/bladder incontinence.  No neck pain.  Tetanus is up-to-date.   HPI  Past Medical History:  Diagnosis Date  . Arthritis    left hip  . Asthma   . breast ca 2005   breast/chemo R mastectomy  . Dermatitis   . GERD (gastroesophageal reflux disease)   . Hypercholesteremia   . Metastasis to lung ([place]) dx'd 08/2011  . Osteopenia due to cancer therapy 09/09/2013  . Palpitations   . Shortness of breath     Patient Active Problem List   Diagnosis Date Noted  . Hyperglycemia 10/02/2017  . Hepatic steatosis 01/23/2017  . OA (osteoarthritis) of hip 06/07/2015  . Elevated cholesterol with high triglycerides 03/22/2015  . Malignant neoplasm of upper-outer quadrant of right breast in female, estrogen receptor positive ([place]) 08/13/2014  . Thoracic aorta atherosclerosis ([place]) 08/13/2014  . Proteinuria  06/16/2014  . Hypercalcemia 06/16/2014  . Fatigue 04/21/2014  . ADHD (attention deficit hyperactivity disorder)   . Osteopenia due to cancer therapy 09/09/2013  . Carcinoma of right breast metastatic to lung ([place]) 10/10/2011  . Malignant pericardial effusion ([place]) 09/10/2011  . Pleural effusion 09/10/2011  . Pneumonia 08/26/2011  . Asthma in adult without complication 72/90/2111  . Osteoarthritis of left hip 08/17/2010    Past Surgical History:  Procedure Laterality Date  . BREAST SURGERY  2005   right  . CATARACT EXTRACTION W/PHACO Left 01/18/2014   Procedure: CATARACT EXTRACTION PHACO AND INTRAOCULAR LENS PLACEMENT (IOC);  Surgeon: Elta Guadeloupe T. Gershon Crane, MD;  Location: [facility];  Service: Ophthalmology;  Laterality: Left;  CDE 15.79  . CATARACT EXTRACTION W/PHACO Right 02/08/2014   Procedure: CATARACT EXTRACTION PHACO AND INTRAOCULAR LENS PLACEMENT (IOC);  Surgeon: Elta Guadeloupe T. Gershon Crane, MD;  Location: [facility];  Service: Ophthalmology;  Laterality: Right;  CDE 4.41  . CHEST TUBE INSERTION  09/11/2011   Procedure: INSERTION PLEURAL DRAINAGE CATHETER;  Surgeon: Gaye Pollack, MD;  Location: [facility];  Service: Thoracic;  Laterality: Right;  . PERICARDIAL WINDOW  09/11/2011   Procedure: PERICARDIAL WINDOW;  Surgeon: Gaye Pollack, MD;  Location: [facility];  Service: Thoracic;  Laterality: N/A;  . REMOVAL OF PLEURAL DRAINAGE CATHETER  12/19/2011   Procedure: REMOVAL OF PLEURAL DRAINAGE CATHETER;  Surgeon: Gaye Pollack, MD;  Location: [facility]  OR;  Service: Thoracic;  Laterality: Right;  TO BE DONE IN MINOR ROOM, SHORT STAY  . [place]  . TOTAL HIP ARTHROPLASTY Left 06/07/2015   Procedure: LEFT TOTAL HIP ARTHROPLASTY ANTERIOR APPROACH;  Surgeon: Gaynelle Arabian, MD;  Location: [facility];  Service: Orthopedics;  Laterality: Left;  Marland Kitchen VIDEO BRONCHOSCOPY  09/11/2011   Procedure: VIDEO BRONCHOSCOPY;  Surgeon: Gaye Pollack, MD;  Location: [facility] OR;  Service: Thoracic;  Laterality: N/A;     OB History   None       Home Medications    Prior to Admission medications   Medication Sig Start Date End Date Taking? Authorizing Provider  blood glucose meter kit and supplies KIT Dispense based on patient and insurance preference. Use up to four times daily as directed. (FOR ICD-9 250.00, 250.01). 11/25/17   Harle Stanford., PA-C  cholecalciferol (VITAMIN D) 1000 units tablet Take 1,000 Units by mouth 2 (two) times daily.    [provider]  gemfibrozil (LOPID) 600 MG tablet TAKE 1 TABLET BY MOUTH TWICE DAILY BEFORE MEAL(S) 11/14/17   Magrinat, Virgie Dad, MD  Investigational everolimus (RAD001) 5 MG tablet [employer]is [id number] Take 2 tablets by mouth daily. Take with a glass of water. 10/03/17   Magrinat, Virgie Dad, MD  letrozole (FEMARA) 2.5 MG tablet TAKE 1 TABLET (2.5 MG TOTAL) BY MOUTH DAILY. 10/31/17   Magrinat, Virgie Dad, MD  loperamide (IMODIUM) 2 MG capsule Take 2 mg by mouth 4 (four) times daily as needed for diarrhea or loose stools. Reported on 11/30/2015    [provider]  metoprolol tartrate (LOPRESSOR) 25 MG tablet Take 0.5 tablets (12.5 mg total) by mouth daily. Patient not taking: Reported on 11/25/2017 05/18/15   Laurie Panda, NP  omeprazole (PRILOSEC) 40 MG capsule Take 1 capsule (40 mg total) by mouth daily as needed. Patient not taking: Reported on 11/25/2017 01/26/15   Magrinat, Virgie Dad, MD  ondansetron (ZOFRAN) 8 MG tablet Take 1 tablet (8 mg total) by mouth 2 (two) times daily as needed (Nausea or vomiting). Patient not taking: Reported on 11/25/2017 09/07/15   Magrinat, Virgie Dad, MD  prochlorperazine (COMPAZINE) 25 MG suppository Place 1 suppository (25 mg total) rectally every 12 (twelve) hours as needed for nausea. Patient not taking: Reported on 11/25/2017 09/07/15   Magrinat, Virgie Dad, MD  rosuvastatin (CRESTOR) 5 MG tablet Take 1 tablet (5 mg total) by mouth daily. 07/10/17   Magrinat, Virgie Dad, MD    Family History Family History  Problem Relation Age of Onset  .  Cancer Mother        breast  . Heart disease Father   . Anesthesia problems Neg Hx     Social History Social History   Tobacco Use  . Smoking status: Former Smoker    Packs/day: 1.50    Years: 30.00    Pack years: 45.00    Types: Cigarettes    Last attempt to quit: 09/10/2007    Years since quitting: 10.2  . Smokeless tobacco: Never Used  Substance Use Topics  . Alcohol use: No  . Drug use: No     Allergies   Aspirin; Latex; and Nsaids   Review of Systems Review of Systems  All other systems reviewed and are negative.    Physical Exam Updated Vital Signs BP 135/70 (BP Location: Left Arm)   Pulse (!) 105   Temp 98.7 F (37.1 C) (Oral)   Resp 18   Ht 5' 4"  (  1.626 m)   Wt 77.1 kg (170 lb)   SpO2 99%   BMI 29.18 kg/m   Physical Exam  Constitutional: She appears well-developed and well-nourished.  HENT:  Head: Normocephalic. Head is without raccoon's eyes and without Battle's sign.  Right Ear: External ear normal. No hemotympanum.  Left Ear: External ear normal. No hemotympanum.  Nose: Nose normal. No mucosal edema. Right sinus exhibits no maxillary sinus tenderness and no frontal sinus tenderness. Left sinus exhibits no maxillary sinus tenderness and no frontal sinus tenderness.  Mouth/Throat: Uvula is midline, oropharynx is clear and moist and mucous membranes are normal. No tonsillar exudate.  Eyes: Pupils are equal, round, and reactive to light. Right eye exhibits no discharge. Left eye exhibits no discharge. No scleral icterus.  Neck: Trachea normal. Neck supple. No spinous process tenderness present. No neck rigidity. Normal range of motion present.  No C-spine tenderness palpation or step-offs.  Cardiovascular: Normal rate, regular rhythm and intact distal pulses.  No murmur heard. Pulses:      Radial pulses are 2+ on the right side, and 2+ on the left side.       Dorsalis pedis pulses are 2+ on the right side, and 2+ on the left side.       Posterior  tibial pulses are 2+ on the right side, and 2+ on the left side.  No lower extremity swelling or edema. Calves symmetric in size bilaterally.  Pulmonary/Chest: Effort normal and breath sounds normal. She exhibits no tenderness.  No increased work of breathing. No accessory muscle use. Patient is sitting upright, speaking in full sentences without difficulty. No stridor.   Abdominal: Soft. Bowel sounds are normal. There is no tenderness. There is no rebound and no guarding.  Musculoskeletal: She exhibits no edema.  Lymphadenopathy:    She has no cervical adenopathy.  Neurological: She is alert.  Speech clear. Follows commands. No facial droop. PERRLA. EOM grossly intact. CN III-XII grossly intact. Grossly moves all extremities 4 without ataxia. Able and appropriate strength for age to upper and lower extremities bilaterally.   Skin: Skin is warm and dry. Capillary refill takes less than 2 seconds. Rash noted. She is not diaphoretic.  Dermatitis-like changes as seen in picture below  Psychiatric: She has a normal mood and affect.  Nursing note and vitals reviewed.        ED Treatments / Results  Labs (all labs ordered are listed, but only abnormal results are displayed) Labs Reviewed - No data to display  EKG None  Radiology No results found.  Procedures Procedures (including critical care time)  Medications Ordered in ED Medications  predniSONE (DELTASONE) tablet 60 mg (has no administration in time range)     Initial Impression / Assessment and Plan / ED Course  I have reviewed the triage vital signs and the nursing notes.  Pertinent labs & imaging results that were available during my care of the patient were reviewed by me and considered in my medical decision making (see chart for details).     67 year old female that presents for dermatitis of the face after applying latex to face after a fall.  Patient denies any difficulty breathing or swallowing.  Pt has a  patent airway without stridor and is handling secretions without difficulty; no angioedema.  No evidence of allergic reaction that require epinephrine.  No evidence of life-threatening condition.  Will discharge home on burst dose of steroids, Benadryl for itching and advised to pick up over-the-counter hydrocortisone 1% cream.  I advised the patient to follow-up with PCP this week. Specific return precautions discussed. Time was given for all questions to be answered. The patient verbalized understanding and agreement with plan. The patient appears safe for discharge home. Patient case seen and discussed with Dr. Lacinda Axon who is in agreement with plan.   Final Clinical Impressions(s) / ED Diagnoses   Final diagnoses:  Allergic contact dermatitis due to other agents    ED Discharge Orders        Ordered    predniSONE (DELTASONE) 20 MG tablet  Daily     12/18/17 1408       Lorelle Gibbs 12/18/17 1409    Nat Christen, MD 12/19/17 [phone]

## 2017-12-18 NOTE — ED Notes (Signed)
Pt fell face forward in gravel driveway on Monday and seen PCP that day.  Pt with redness and burning to chin and right cheek, hx of dermaitis "years ago" per pt due to an allergy to latex, pt believes the ice pack she used on Monday from a friend may have had Latex.

## 2017-12-18 NOTE — ED Triage Notes (Signed)
Pt fell in driveway Monday. Was seen at pcp. Abrasions then to face. Scabbing noted to chin and right cheek. States woke up yesterday with dermatitis and is worse. States hx of. Nad.

## 2017-12-18 NOTE — Telephone Encounter (Signed)
Patient had reported that her PCP has written a script for Metformin for her new diagnosis of diabetes. She called to ensure it is OK to take this while on her study drug, Everlimous. [facility] pharmacist, Raul Del PharmD researched this and found no contraindication for taking metformin and everlimous. Patient was notifed and appreciated the information. She will pick up the script today. Mauri Reading Michaelle Copas, Therapist, sports, Product/process development scientist

## 2017-12-25 ENCOUNTER — Inpatient Hospital Stay (HOSPITAL_BASED_OUTPATIENT_CLINIC_OR_DEPARTMENT_OTHER): Payer: Medicare Other | Admitting: Adult Health

## 2017-12-25 ENCOUNTER — Inpatient Hospital Stay: Payer: Medicare Other | Attending: Oncology

## 2017-12-25 ENCOUNTER — Encounter: Payer: Self-pay | Admitting: Adult Health

## 2017-12-25 ENCOUNTER — Encounter: Payer: Self-pay | Admitting: *Deleted

## 2017-12-25 ENCOUNTER — Telehealth: Payer: Self-pay | Admitting: Adult Health

## 2017-12-25 ENCOUNTER — Other Ambulatory Visit: Payer: Self-pay | Admitting: Hematology and Oncology

## 2017-12-25 VITALS — BP 127/67 | HR 84 | Temp 98.7°F | Resp 18 | Ht 64.0 in | Wt 152.5 lb

## 2017-12-25 DIAGNOSIS — Z170 Estrogen receptor positive status [ER+]: Secondary | ICD-10-CM | POA: Diagnosis not present

## 2017-12-25 DIAGNOSIS — Z96642 Presence of left artificial hip joint: Secondary | ICD-10-CM | POA: Diagnosis not present

## 2017-12-25 DIAGNOSIS — Z006 Encounter for examination for normal comparison and control in clinical research program: Secondary | ICD-10-CM

## 2017-12-25 DIAGNOSIS — K760 Fatty (change of) liver, not elsewhere classified: Secondary | ICD-10-CM | POA: Diagnosis not present

## 2017-12-25 DIAGNOSIS — C50911 Malignant neoplasm of unspecified site of right female breast: Secondary | ICD-10-CM

## 2017-12-25 DIAGNOSIS — E782 Mixed hyperlipidemia: Secondary | ICD-10-CM

## 2017-12-25 DIAGNOSIS — Z79811 Long term (current) use of aromatase inhibitors: Secondary | ICD-10-CM | POA: Diagnosis not present

## 2017-12-25 DIAGNOSIS — C773 Secondary and unspecified malignant neoplasm of axilla and upper limb lymph nodes: Secondary | ICD-10-CM

## 2017-12-25 DIAGNOSIS — J449 Chronic obstructive pulmonary disease, unspecified: Secondary | ICD-10-CM | POA: Insufficient documentation

## 2017-12-25 DIAGNOSIS — C50411 Malignant neoplasm of upper-outer quadrant of right female breast: Secondary | ICD-10-CM

## 2017-12-25 DIAGNOSIS — C7800 Secondary malignant neoplasm of unspecified lung: Principal | ICD-10-CM

## 2017-12-25 DIAGNOSIS — E119 Type 2 diabetes mellitus without complications: Secondary | ICD-10-CM

## 2017-12-25 DIAGNOSIS — E2839 Other primary ovarian failure: Secondary | ICD-10-CM

## 2017-12-25 DIAGNOSIS — I700 Atherosclerosis of aorta: Secondary | ICD-10-CM

## 2017-12-25 DIAGNOSIS — Z1239 Encounter for other screening for malignant neoplasm of breast: Secondary | ICD-10-CM

## 2017-12-25 DIAGNOSIS — I3131 Malignant pericardial effusion in diseases classified elsewhere: Secondary | ICD-10-CM

## 2017-12-25 DIAGNOSIS — C801 Malignant (primary) neoplasm, unspecified: Secondary | ICD-10-CM

## 2017-12-25 DIAGNOSIS — I313 Pericardial effusion (noninflammatory): Secondary | ICD-10-CM

## 2017-12-25 LAB — CBC WITH DIFFERENTIAL (CANCER CENTER ONLY)
BASOS PCT: 1 %
Basophils Absolute: 0.1 10*3/uL (ref 0.0–0.1)
EOS ABS: 0.2 10*3/uL (ref 0.0–0.5)
EOS PCT: 2 %
HEMATOCRIT: 45.8 % (ref 34.8–46.6)
Hemoglobin: 15.3 g/dL (ref 11.6–15.9)
Lymphocytes Relative: 14 %
Lymphs Abs: 1.4 10*3/uL (ref 0.9–3.3)
MCH: 26.1 pg (ref 25.1–34.0)
MCHC: 33.3 g/dL (ref 31.5–36.0)
MCV: 78.3 fL — ABNORMAL LOW (ref 79.5–101.0)
MONOS PCT: 9 %
Monocytes Absolute: 0.9 10*3/uL (ref 0.1–0.9)
NEUTROS ABS: 7.4 10*3/uL — AB (ref 1.5–6.5)
Neutrophils Relative %: 74 %
PLATELETS: 308 10*3/uL (ref 145–400)
RBC: 5.85 MIL/uL — AB (ref 3.70–5.45)
RDW: 14.1 % (ref 11.2–14.5)
WBC: 9.9 10*3/uL (ref 3.9–10.3)

## 2017-12-25 LAB — CMP (CANCER CENTER ONLY)
ALK PHOS: 215 U/L — AB (ref 40–150)
ALT: 53 U/L (ref 0–55)
AST: 50 U/L — AB (ref 5–34)
Albumin: 3.9 g/dL (ref 3.5–5.0)
Anion gap: 13 — ABNORMAL HIGH (ref 3–11)
BILIRUBIN TOTAL: 0.3 mg/dL (ref 0.2–1.2)
BUN: 25 mg/dL (ref 7–26)
CALCIUM: 10.5 mg/dL — AB (ref 8.4–10.4)
CO2: 18 mmol/L — ABNORMAL LOW (ref 22–29)
Chloride: 106 mmol/L (ref 98–109)
Creatinine: 1.18 mg/dL — ABNORMAL HIGH (ref 0.60–1.10)
GFR, EST NON AFRICAN AMERICAN: 47 mL/min — AB (ref >60)
GFR, Est AFR Am: 54 mL/min — ABNORMAL LOW (ref >60)
Glucose, Bld: 173 mg/dL — ABNORMAL HIGH (ref 70–140)
POTASSIUM: 3.9 mmol/L (ref 3.5–5.1)
Sodium: 137 mmol/L (ref 136–145)
TOTAL PROTEIN: 8.7 g/dL — AB (ref 6.4–8.3)

## 2017-12-25 LAB — LIPID PANEL
CHOL/HDL RATIO: 5.2 ratio
CHOLESTEROL: 325 mg/dL — AB (ref 0–200)
HDL: 62 mg/dL (ref >40)
LDL Cholesterol: UNDETERMINED mg/dL (ref 0–99)
TRIGLYCERIDES: 463 mg/dL — AB (ref <150)
VLDL: UNDETERMINED mg/dL (ref 0–40)

## 2017-12-25 MED ORDER — LETROZOLE 2.5 MG PO TABS: 2.5000 mg | ORAL_TABLET | Freq: Every day | ORAL | 0 refills | Status: DC

## 2017-12-25 MED ORDER — INV-EVEROLIMUS (RAD0001) 5MG TABLET NOVARTIS CRAD001Y24135: 2.0000 | ORAL_TABLET | Freq: Every day | ORAL | 0 refills | Status: DC

## 2017-12-25 NOTE — Patient Instructions (Signed)
Bone Health Bones protect organs, store calcium, and anchor muscles. Good health habits, such as eating nutritious foods and exercising regularly, are important for maintaining healthy bones. They can also help to prevent a condition that causes bones to lose density and become weak and brittle (osteoporosis). Why is bone mass important? Bone mass refers to the amount of bone tissue that you have. The higher your bone mass, the stronger your bones. An important step toward having healthy bones throughout life is to have strong and dense bones during childhood. A young adult who has a high bone mass is more likely to have a high bone mass later in life. Bone mass at its greatest it is called peak bone mass. A large decline in bone mass occurs in older adults. In women, it occurs about the time of menopause. During this time, it is important to practice good health habits, because if more bone is lost than what is replaced, the bones will become less healthy and more likely to break (fracture). If you find that you have a low bone mass, you may be able to prevent osteoporosis or further bone loss by changing your diet and lifestyle. How can I find out if my bone mass is low? Bone mass can be measured with an X-ray test that is called a bone mineral density (BMD) test. This test is recommended for all women who are age 65 or older. It may also be recommended for men who are age 70 or older, or for people who are more likely to develop osteoporosis due to:  Having bones that break easily.  Having a long-term disease that weakens bones, such as kidney disease or rheumatoid arthritis.  Having menopause earlier than normal.  Taking medicine that weakens bones, such as steroids, thyroid hormones, or hormone treatment for breast cancer or prostate cancer.  Smoking.  Drinking three or more alcoholic drinks each day.  What are the nutritional recommendations for healthy bones? To have healthy bones, you  need to get enough of the right minerals and vitamins. Most nutrition experts recommend getting these nutrients from the foods that you eat. Nutritional recommendations vary from person to person. Ask your health care provider what is healthy for you. Here are some general guidelines. Calcium Recommendations Calcium is the most important (essential) mineral for bone health. Most people can get enough calcium from their diet, but supplements may be recommended for people who are at risk for osteoporosis. Good sources of calcium include:  Dairy products, such as low-fat or nonfat milk, cheese, and yogurt.  Dark green leafy vegetables, such as bok choy and broccoli.  Calcium-fortified foods, such as orange juice, cereal, bread, soy beverages, and tofu products.  Nuts, such as almonds.  Follow these recommended amounts for daily calcium intake:  Children, age 1?3: 700 mg.  Children, age 4?8: 1,000 mg.  Children, age 9?13: 1,300 mg.  Teens, age 14?18: 1,300 mg.  Adults, age 19?50: 1,000 mg.  Adults, age 51?70: ? Men: 1,000 mg. ? Women: 1,200 mg.  Adults, age 71 or older: 1,200 mg.  Pregnant and breastfeeding females: ? Teens: 1,300 mg. ? Adults: 1,000 mg.  Vitamin D Recommendations Vitamin D is the most essential vitamin for bone health. It helps the body to absorb calcium. Sunlight stimulates the skin to make vitamin D, so be sure to get enough sunlight. If you live in a cold climate or you do not get outside often, your health care provider may recommend that you take vitamin   D supplements. Good sources of vitamin D in your diet include:  Egg yolks.  Saltwater fish.  Milk and cereal fortified with vitamin D.  Follow these recommended amounts for daily vitamin D intake:  Children and teens, age 1?18: 600 international units.  Adults, age 50 or younger: 400-800 international units.  Adults, age 51 or older: 800-1,000 international units.  Other Nutrients Other nutrients  for bone health include:  Phosphorus. This mineral is found in meat, poultry, dairy foods, nuts, and legumes. The recommended daily intake for adult men and adult women is 700 mg.  Magnesium. This mineral is found in seeds, nuts, dark green vegetables, and legumes. The recommended daily intake for adult men is 400?420 mg. For adult women, it is 310?320 mg.  Vitamin K. This vitamin is found in green leafy vegetables. The recommended daily intake is 120 mg for adult men and 90 mg for adult women.  What type of physical activity is best for building and maintaining healthy bones? Weight-bearing and strength-building activities are important for building and maintaining peak bone mass. Weight-bearing activities cause muscles and bones to work against gravity. Strength-building activities increases muscle strength that supports bones. Weight-bearing and muscle-building activities include:  Walking and hiking.  Jogging and running.  Dancing.  Gym exercises.  Lifting weights.  Tennis and racquetball.  Climbing stairs.  Aerobics.  Adults should get at least 30 minutes of moderate physical activity on most days. Children should get at least 60 minutes of moderate physical activity on most days. Ask your health care provide what type of exercise is best for you. Where can I find more information? For more information, check out the following websites:  National Osteoporosis Foundation: http://nof.org/learn/basics  National Institutes of Health: http://www.niams.nih.gov/Health_Info/Bone/Bone_Health/bone_health_for_life.asp  This information is not intended to replace advice given to you by your health care provider. Make sure you discuss any questions you have with your health care provider. Document Released: 10/05/2003 Document Revised: 02/02/2016 Document Reviewed: 07/20/2014 Elsevier Interactive Patient Education  2018 Elsevier Inc.  

## 2017-12-25 NOTE — Progress Notes (Signed)
12/25/17 at 11:26am- [employer]/[facility] - Extension Phase #10 visit  The patientwasintotheclinicthismorningunaccompanied forherroutine 12-week follow-up visit and study drug dispensing. Patient returned 6 kits of everolimus with4tablets unopened. The pt's overall 12 week study drug compliance is 97.6%for the date range of 10/02/2017-12/24/2017. Patient returned paper calendarsdocumenting daily doses of letrozole and everolimus with 2daily dosesmissed on 11/19/2017 and 12/15/2017.Medication was returned to the pharmacy for drug accountability by Joretta Bachelor. The pt said that she is feeling well and has been exercising more lately.  Patient reports the previously noted AE's (fatigue, hot flashes, dry skin, back pain, and memory impairment)as ongoingand unchanged in severity.  Patient reports her blurry vision in her left eye is still ongoing.She has not yet scheduled an appointment with her ophthalmologist for additional follow up. She denies any current problems with nausea and heartburn.Based on lab results review and history and physical byDr. Magrinat's NP, Gardenia Phlegm, thepatient's condition is acceptable for continued treatment.   In heropinion, she is still receiving benefit from her study treatment. The pt was recently seen by her PCP for her ongoing elevated blood sugar.  The pt was placed on Metformin.  The pt has been checking her glucose daily.  The pt also tripped on her dog's leash and had some mild trauma to her face (fall- unrelated to pt's study treatment).  The pt was later seen in the ED because the pt inadvertently used a ice pack with latex.  The pt has a latex allery/sensitivity.  The pt was prescribed prednisone for her facial rash.  The pt said that she began the steroids on 5/23 and stopped them on 5/27.  The protocol allows for short courses of systemic steroids.  The NP reviewed the pt's lab results with her.  All of her abnormal lab  values were considered "not clinically significant".  The NP did strongly encourage the pt to drink more water since she is on Metformin now for her hyperglycemia.  The pt's creatinine was elevated today (grade 1 toxicitiy).      Six kits of study drug everolimus were dispensed today for the next 12 weeks supply. In addition, patient will continue commercial supply of letrozole 2.5mg  daily. The next study visit is expected to occur on8/22/2019.The pt will have her restaging CT chest prior to her next appt.  The pt is also scheduled for her mammogram and bone density.  The research nurse thankedthepatient for hercontinuedparticipationin the study. Brion Aliment RN, BSN, [place] Clinical Research Nurse 12/25/2017 11:29 AM   12/26/17 at 2:05pm- The pt's lipid results were discussed with Gardenia Phlegm, NP and Dr. Jana Hakim.  The pt's triglycerides were increased from her last visit.  The pt's hypertriglycerides and elevated cholesterol were still a grade 2 toxicity.  The protocol states in section [phone] that "no dose adjustments are required and to manage with appropriate medical therapy".  Dr. Jana Hakim felt that the pt's elevated triglycerides were due to the pt's new diagnosis of diabetes.  He said that once her diabetes is better controlled then her triglycerides would improve.  The MD specifically did not want to add or change the pt's current anti-lipid medicines/treatment.  The research nurse called the pt and informed her regarding her elevated lipid panel. The pt thanked the nurse for the update.  The pt said that she will be very compliant with her Metformin and dietary modifications.  The pt was informed that if her triglycerides increase to a grade 3 then her study drug would have to be  reduced.  The pt said that she wants to stay on her current dose if she can.  The pt was urged to call Dr. Jana Hakim or her PCP for any concerns/questions. Brion Aliment RN, BSN, [employer] Clinical  Research Nurse 12/26/2017 2:19 PM   Adverse Event Log Protocol: [employer] [id number] Cycle: Extension Phase #9(AE date range3/8/19-5/30/19) Ongoing and unchanged in severity:fatigue, hot flashes (mild), memory impairment, dry skin, and back pain  Event Grade Onset Date Resolved Date Attribution to letrozole Attribution to everolimus  Blurred vision, left eye 1 06/28/2016 Ongoing Not suspected Not suspected  Alkaline phosphatase increased 1 08/14/2012 Ongoing Not suspected Suspected  AST increased 1 11/30/2015 Ongoing Not suspected Suspected  Creatinine increased 1 12/25/2017 Ongoing Not suspected Suspected  Hypercalcemia 1 09/30/17 Onging Not suspected Not suspected  ALT increased  1 09/30/17 12/25/17  Not Suspected  Suspected  Hyperglycemia  2 09/30/17 Ongoing Not Suspected Suspected   Hypertriglyceridemia  2 09/30/17 Ongoing  Not Suspected  Not Suspected   Fall 1 12/15/17 12/25/17 Not Suspected  Not Suspected   Dermatitis  2 12/18/17 12/22/17 Not Suspected Not Suspected

## 2017-12-25 NOTE — Telephone Encounter (Signed)
Gave patient AVs and calendar of upcoming august appointments.  °

## 2017-12-25 NOTE — Progress Notes (Signed)
[place]  Telephone:([phone]) [phone] Fax:(336) [phone]     ID: Barbara Parks DOB: 09/01/1950  MR#: [id number]  UVO#:[id number]  Patient Care Team: Darcus Austin, MD as PCP - General (Family Medicine) Jerline Pain, MD (Cardiology) Gaye Pollack, MD (Cardiothoracic Surgery) Magrinat, Virgie Dad, MD as Consulting Physician (Oncology) OTHER MD: Dr Erroll Luna- Merlene Laughter, DDS; Gaynelle Arabian MD  CHIEF COMPLAINT: metastatic breast cancer, on BOLERO study  CURRENT TREATMENT: letrozole + everolimus  BREAST CANCER HISTORY: From Dr. Collier Salina Parks's original intake note 04/13/2004:  "This woman has been in good health all of her life. She recently moved from [place] to work here.  She palpated a mass at about the 12 o'clock position in mid-July. She has not noticed any nipple retraction or skin changes.  She was seen by her primary care doctor who subsequently referred her for a mammogram.  Mammogram was performed on 03/01/04. This demonstrated a spiculated 2 cm mass at the 12 o'clock position in the right breast.  Upper outer quadrant of the left breast shows some distortion.   Physical exam at that time showed a firm, nontender nodule at the 12 o'clock position in the right breast, 5 cm from the nipple.  Ultrasound of this area showed a hypoechoic ill-defined mass, measuring 2.2 x 1.3 x 1.4 cm.  Physical exam of the left breast showed general vague thickening, upper outer quadrant of the left breast with a discrete palpable mass. The ultrasound performed showed a single hypoechoic ill-defined nodule at the 1 o'clock position, measuring 7 x 5 x 6 mm.  She had biopsies of both lesions on 03/02/03.  Needle core biopsy of the lesion on the right breast revealed invasive mammary carcinoma.  Needle core biopsy of the left breast showed a complex fibroadenoma.  Prognostic panel of the lesion on the right breast showed it to be ER positive at 73%, PR positive at 90% and proliferative index 9%,  HER-2 was 1+.  Patient was referred to Dr. Annamaria Boots, who performed a simple mastectomy with sentinel lymph node evaluation on 03/22/04.  Final pathology showed this to be an invasive ductal carcinoma  with lobular features, measuring 2.2 cm, grade 2 of 3.  Margins negative for carcinoma.  Invasive ductal carcinoma was extended to involve deep dermis of the nipple.  Lymphovascular invasion was identified.  Total of 4 sentinel lymph nodes were evaluated.  Touch imprints at the time of the OR was felt to be negative.  Subsequent evaluation showed a 5 mm focus of metastatic carcinoma in one of the four lymph nodes on microscopic after sectioning.  There was extracapsular extension  of one lymph node as well. The remaining three lymph nodes were all negative."  The patient's subsequent history is detailed below.  INTERVAL HISTORY: Barbara Parks returns today for follow-up and treatment of her metastatic estrogen receptor positive breast cancer. She continues on the [facility] trial this being her extension face visit . She tolerates letrozole well.   In addition to letrozole, she takes everolimus, 10 mg daily.   Barbara Parks last scans were in March.  Her scans were stable.     REVIEW OF SYSTEMS:  Barbara Parks was seen since her last visit by Sandi Mealy due to her blood sugars.  She was recommended diet control by Lucianne Lei.  She was also seen by her PCP 5/20 and was started on Metformin 565m daily.  She is checking her blood sugar at home in the mornings.  She says while taking the  Prednisone her sugar was higher, but most recently it was 130, then 185.  She is keeping track of this.  She is working on a diabetic diet and exercise.  She does not have any "diarrhea" she does have an occasional loose stool.  She also sustained a fall on 5/20 and went to the ER a couple of days later (5/23) due to an allergic reaction from the ice pack she used that apparently had latex in it.  She did receive a prednisone taper for this.  She is doing much  better today.    PAST MEDICAL HISTORY: Past Medical History:  Diagnosis Date  . Arthritis    left hip  . Asthma   . breast ca 2005   breast/chemo R mastectomy  . Dermatitis   . GERD (gastroesophageal reflux disease)   . Hypercholesteremia   . Metastasis to lung ([place]) dx'd 08/2011  . Osteopenia due to cancer therapy 09/09/2013  . Palpitations   . Shortness of breath     PAST SURGICAL HISTORY: Past Surgical History:  Procedure Laterality Date  . BREAST SURGERY  2005   right  . CATARACT EXTRACTION W/PHACO Left 01/18/2014   Procedure: CATARACT EXTRACTION PHACO AND INTRAOCULAR LENS PLACEMENT (IOC);  Surgeon: Elta Guadeloupe T. Gershon Crane, MD;  Location: [facility];  Service: Ophthalmology;  Laterality: Left;  CDE 15.79  . CATARACT EXTRACTION W/PHACO Right 02/08/2014   Procedure: CATARACT EXTRACTION PHACO AND INTRAOCULAR LENS PLACEMENT (IOC);  Surgeon: Elta Guadeloupe T. Gershon Crane, MD;  Location: [facility];  Service: Ophthalmology;  Laterality: Right;  CDE 4.41  . CHEST TUBE INSERTION  09/11/2011   Procedure: INSERTION PLEURAL DRAINAGE CATHETER;  Surgeon: Gaye Pollack, MD;  Location: [place];  Service: Thoracic;  Laterality: Right;  . PERICARDIAL WINDOW  09/11/2011   Procedure: PERICARDIAL WINDOW;  Surgeon: Gaye Pollack, MD;  Location: [facility] OR;  Service: Thoracic;  Laterality: N/A;  . REMOVAL OF PLEURAL DRAINAGE CATHETER  12/19/2011   Procedure: REMOVAL OF PLEURAL DRAINAGE CATHETER;  Surgeon: Gaye Pollack, MD;  Location: [place];  Service: Thoracic;  Laterality: Right;  TO BE DONE IN MINOR ROOM, SHORT STAY  . [place]  . TOTAL HIP ARTHROPLASTY Left 06/07/2015   Procedure: LEFT TOTAL HIP ARTHROPLASTY ANTERIOR APPROACH;  Surgeon: Gaynelle Arabian, MD;  Location: [facility];  Service: Orthopedics;  Laterality: Left;  Marland Kitchen VIDEO BRONCHOSCOPY  09/11/2011   Procedure: VIDEO BRONCHOSCOPY;  Surgeon: Gaye Pollack, MD;  Location: [facility] OR;  Service: Thoracic;  Laterality: N/A;    FAMILY HISTORY Family History  Problem Relation Age  of Onset  . Cancer Mother        breast  . Heart disease Father   . Anesthesia problems Neg Hx    the patient's mother was diagnosed with breast cancer at age 54, she died age 61 from congestive heart failure..The patient's father died from heart disease at age 62.  She has one sister alive & well.  Two brothers alive & well, one with diabetes. The patient's sister was also diagnosed with breast cancer, and was tested for the BRCA gene, and was negative. The patient herself has not been tested. There is no history of ovarian cancer in the family  GYNECOLOGIC HISTORY:  No LMP recorded. Patient is postmenopausal. Menarche age 70, the patient is GX P0. She stopped having periods with chemotherapy in 2005. 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 She was a Archivist. She is single, lives alone with her Shitzu-poodle Sammie.. Family is all in the [place] area.  Note her brother is currently under treatment as discussed today    ADVANCED DIRECTIVES: Not in place. At the 02/23/2014 visit the patient was given the appropriate documents to complete and notarize at her discretion. She tells me she is planning to name her sister, Billie Ruddy, as healthcare power of attorney. [name] can be reached at [phone]   HEALTH MAINTENANCE: Social History   Tobacco Use  . Smoking status: Former Smoker    Packs/day: 1.50    Years: 30.00    Pack years: 45.00    Types: Cigarettes    Last attempt to quit: 09/10/2007    Years since quitting: 10.2  . Smokeless tobacco: Never Used  Substance Use Topics  . Alcohol use: No  . Drug use: No     Colonoscopy:  PAP:  Bone density: 08/14/2015, T score -1.6  Lipid panel:  Allergies  Allergen Reactions  . Aspirin     REACTION: upset stomach  . Latex Other (See Comments)    Blistering and skin peels off  . Nsaids Nausea And Vomiting     Extreme nausea and vomiting    Current Outpatient Medications  Medication Sig Dispense Refill  . blood glucose meter kit and supplies KIT Dispense based on patient and insurance preference. Use up to four times daily as directed. (FOR ICD-9 250.00, 250.01). 1 each 0  . cholecalciferol (VITAMIN D) 1000 units tablet Take 1,000 Units by mouth 2 (two) times daily.    Marland Kitchen gemfibrozil (LOPID) 600 MG tablet TAKE 1 TABLET BY MOUTH TWICE DAILY BEFORE MEAL(S) 180 tablet 1  . letrozole (FEMARA) 2.5 MG tablet TAKE 1 TABLET (2.5 MG TOTAL) BY MOUTH DAILY. 90 tablet 3  . loperamide (IMODIUM) 2 MG capsule Take 2 mg by mouth 4 (four) times daily as needed for diarrhea or loose stools. Reported on 11/30/2015    . metFORMIN (GLUCOPHAGE-XR) 500 MG 24 hr tablet TAKE 1 TABLET BY MOUTH ONCE DAILY WITH  EVENING  MEAL  3  . omeprazole (PRILOSEC) 40 MG capsule Take 1 capsule (40 mg total) by mouth daily as needed. 90 capsule 3  . ondansetron (ZOFRAN) 8 MG tablet Take 1 tablet (8 mg total) by mouth 2 (two) times daily as needed (Nausea or vomiting). 30 tablet 1  . prochlorperazine (COMPAZINE) 25 MG suppository Place 1 suppository (25 mg total) rectally every 12 (twelve) hours as needed for nausea. 12 suppository 3  . rosuvastatin (CRESTOR) 5 MG tablet Take 1 tablet (5 mg total) by mouth daily. 30 tablet 6  . Investigational everolimus (RAD001) 5 MG tablet Novartis [id number] Take 2 tablets by mouth daily. Take with a glass of water. 168 tablet 0  . letrozole (FEMARA) 2.5 MG tablet Take 1 tablet (2.5 mg total) by mouth daily. 30 tablet 0   No current facility-administered medications for this visit.     OBJECTIVE:  Vitals:   12/25/17 1027 12/25/17 1029  BP: 117/68 127/67  Pulse: 84 84  Resp:    Temp:    SpO2:       Body mass index is 26.18 kg/m.    ECOG FS:0 - Asymptomatic GENERAL: Patient is a well appearing female in no acute distress HEENT:  Sclerae anicteric.  Oropharynx clear and moist. No ulcerations or  evidence of oropharyngeal candidiasis. Neck is supple.  NODES:  No  cervical, supraclavicular, or axillary lymphadenopathy palpated.  BREAST EXAM:  Right breast s/p mastectomy, no nodularity, no masses, left breast without nodules, masses, skin or nipple changes, benign bilateral breast exam LUNGS:  Clear to auscultation bilaterally.  No wheezes or rhonchi. HEART:  Regular rate and rhythm. No murmur appreciated. ABDOMEN:  Soft, nontender.  Positive, normoactive bowel sounds. No organomegaly palpated. MSK:  No focal spinal tenderness to palpation. Full range of motion bilaterally in the upper extremities. EXTREMITIES:  No peripheral edema.   SKIN:  Clear with no obvious rashes or skin changes. No nail dyscrasia. NEURO:  Nonfocal. Well oriented.  Appropriate affect.    LAB  RESULTS:  CMP     Component Value Date/Time   NA 137 12/25/2017 0959   NA 139 07/08/2017 0942   K 3.9 12/25/2017 0959   K 3.8 07/08/2017 0942   CL 106 12/25/2017 0959   CL 109 (H) 12/31/2012 0940   CO2 18 (L) 12/25/2017 0959   CO2 18 (L) 07/08/2017 0942   GLUCOSE 173 (H) 12/25/2017 0959   GLUCOSE 156 (H) 07/08/2017 0942   GLUCOSE 127 (H) 12/31/2012 0940   BUN 25 12/25/2017 0959   BUN 17.7 07/08/2017 0942   CREATININE 1.18 (H) 12/25/2017 0959   CREATININE 1.0 07/08/2017 0942   CALCIUM 10.5 (H) 12/25/2017 0959   CALCIUM 10.4 07/08/2017 0942   PROT 8.7 (H) 12/25/2017 0959   PROT 7.7 07/08/2017 0942   ALBUMIN 3.9 12/25/2017 0959   ALBUMIN 3.4 (L) 07/08/2017 0942   AST 50 (H) 12/25/2017 0959   AST 54 (H) 07/08/2017 0942   ALT 53 12/25/2017 0959   ALT 39 07/08/2017 0942   ALKPHOS 215 (H) 12/25/2017 0959   ALKPHOS 232 (H) 07/08/2017 0942   BILITOT 0.3 12/25/2017 0959   BILITOT 0.42 07/08/2017 0942   GFRNONAA 47 (L) 12/25/2017 0959   GFRAA 54 (L) 12/25/2017 0959    I No results found for: SPEP  Lab Results  Component Value Date   WBC 9.9 12/25/2017   NEUTROABS 7.4 (H) 12/25/2017   HGB 15.3 12/25/2017    HCT 45.8 12/25/2017   MCV 78.3 (L) 12/25/2017   PLT 308 12/25/2017      Chemistry      Component Value Date/Time   NA 137 12/25/2017 0959   NA 139 07/08/2017 0942   K 3.9 12/25/2017 0959   K 3.8 07/08/2017 0942   CL 106 12/25/2017 0959   CL 109 (H) 12/31/2012 0940   CO2 18 (L) 12/25/2017 0959   CO2 18 (L) 07/08/2017 0942   BUN 25 12/25/2017 0959   BUN 17.7 07/08/2017 0942   CREATININE 1.18 (H) 12/25/2017 0959   CREATININE 1.0 07/08/2017 0942      Component Value Date/Time   CALCIUM 10.5 (H) 12/25/2017 0959   CALCIUM 10.4 07/08/2017 0942   ALKPHOS 215 (H) 12/25/2017 0959   ALKPHOS 232 (H) 07/08/2017 0942   AST 50 (H) 12/25/2017 0959   AST 54 (H) 07/08/2017 0942   ALT 53 12/25/2017 0959   ALT 39 07/08/2017 0942   BILITOT 0.3 12/25/2017 0959   BILITOT 0.42 07/08/2017 0942       Lab Results  Component Value Date   LABCA2 58 (H) 09/14/2012    No components found for: [id number]  No results for input(s): INR in the last 168 hours.  Urinalysis    Component Value Date/Time   COLORURINE YELLOW 11/25/2017 0901   APPEARANCEUR HAZY (A) 11/25/2017 0901   LABSPEC 1.017 11/25/2017  0901   LABSPEC 1.030 07/13/2015 0841   PHURINE 5.0 11/25/2017 0901   GLUCOSEU NEGATIVE 11/25/2017 0901   GLUCOSEU Negative 07/13/2015 0841   HGBUR MODERATE (A) 11/25/2017 0901   BILIRUBINUR NEGATIVE 11/25/2017 0901   BILIRUBINUR Negative 07/13/2015 0841   KETONESUR NEGATIVE 11/25/2017 0901   PROTEINUR 100 (A) 11/25/2017 0901   UROBILINOGEN 0.2 07/13/2015 0841   NITRITE NEGATIVE 11/25/2017 0901   LEUKOCYTESUR NEGATIVE 11/25/2017 0901   LEUKOCYTESUR Negative 07/13/2015 0841    STUDIES: No results found.   ASSESSMENT: 67 y.o. Barbara Parks, Barbara Parks woman with stage IV breast cancer, on BOLERO-4 trial  (1) status post right mastectomy and sentinel lymph node sampling 03/22/2004 for a right upper-outer quadrant pT2 pN1, stage IIB invasive ductal carcinoma with lobular features, grade 2, estrogen  receptor and progesterone receptor positive, HER-2 negative, with an MIB-1 of 9% ([id number] and [id number])  (2) addition all right axillary lymph node sampling 05/07/2004 showed 2 benign lymph nodes (4 lymph nodes previously removed, so total was one positive lymph node out of 6; [id number])  (3) the patient was evaluated by radiation oncology; no postmastectomy radiation recommended  (4) adjuvant chemotherapy with dose dense doxorubicin and cyclophosphamide x4 cycles (first cycle delayed one week) followed by dose dense paclitaxel x4 was completed 09/18/2004  (5) tamoxifen started March 2006, discontinued 2009  METASTATIC DISEASE: February 2013 (6) presenting with a large pericardial effusion, large right pleural effusion and possible right middle lobe bronchial obstruction February 2013, status post pericardial window placement, fiberoptic bronchoscopy and right Pleurx placement 09/11/2011, with biopsy of the bronchus intermedius and pericardium positive for metastatic breast cancer, estrogen receptor 91% positive with moderate staining intensity, progesterone receptor 100% positive with strong staining intensity, with an MIB-1 of 35%, and no HER-2 amplification, the signals ratio being 1.37 ([id number])  (7) enrolled in BOLERO-4 trial 10/10/2011, receiving letrozole and everolimus  (a) two small areas of enhancement in the cerebellum noted by brain MRI 10/03/2011 were no longer apparent on repeat MRI 08/21/2012-- most recent brain MRI 04/01/2014 showed no evidence of intracranial metastatic disease  (b) sclerotic lesions in left iliac bone and sacrum have not been biopsied; stable; plan is to start zolendronate after patient updates her dental care (extraction planned)  (c) RLL lung nodule, stable (rescanned 07/12/2014 and 08/11/2014) R hilar and subcarinal lymph nodes: stable  (9) additional problems:  (a) hepatic steatosis  (b) COPD/ emphysema/ asthma  (c) advanced L hip osteoarthritis, status  post left total hip replacement 06/07/2015  (d) aortoiliac atherosclerosis  (e) dental evaluation pending w possible dental extractions  (f) possible thalassemia trait  (10) Bone density concerns: DEXA scan 08/11/2013 was normal  (11) likely thalassemia trait  (12) hepatic steatosis.  PLAN:  Kristl is doing well today.  She continues to tolerate Letrozole and Everolimus well and will continue this.  She is clinically and radographically without signs of recurrence.  She will be due for mammogram in 02/2018.  She is overdue for a bone density, and I have ordered this to be done at the time of her mammogram.  She and I reviewed bone health and I gave her detailed Information about this in her AVS.  She will continue to f/u with her PCP regarding her diabetes.  I encouraged healthy diet and exercise, along with compliance in taking her Metformin and checking her blood sugar.  We did note that when she has her CT chest in August, she will need to hold the metformin around that time, and  also drink plenty of fluids around the time of her CT scan.  She understood.   [place] will be due for f/u on 03/19/2018.  We were going to rescan in the beginning of September, however, will do this at the end of August instead prior to her f/u with Dr. Jana Hakim.  Harmoni knows to call for any other issues that may develop before the next visit.  A total of (30) minutes of face-to-face time was spent with this patient with greater than 50% of that time in counseling and care-coordination.   Wilber Bihari, NP  12/25/17 9:47 PM Medical Oncology and Hematology [facility] [address] [place], [place] [address] Tel. [phone]    Fax. [phone]  .

## 2017-12-26 LAB — CANCER ANTIGEN 27.29: CA 27.29: 74 U/mL — ABNORMAL HIGH (ref 0.0–38.6)

## 2018-01-19 ENCOUNTER — Encounter: Payer: Self-pay | Admitting: Registered"

## 2018-01-19 ENCOUNTER — Encounter: Payer: Medicare Other | Attending: Family Medicine | Admitting: Registered"

## 2018-01-19 DIAGNOSIS — E119 Type 2 diabetes mellitus without complications: Secondary | ICD-10-CM | POA: Diagnosis not present

## 2018-01-19 DIAGNOSIS — Z6826 Body mass index (BMI) 26.0-26.9, adult: Secondary | ICD-10-CM | POA: Insufficient documentation

## 2018-01-19 DIAGNOSIS — Z833 Family history of diabetes mellitus: Secondary | ICD-10-CM | POA: Diagnosis not present

## 2018-01-19 DIAGNOSIS — Z713 Dietary counseling and surveillance: Secondary | ICD-10-CM | POA: Diagnosis not present

## 2018-01-19 NOTE — Progress Notes (Signed)
Diabetes Self-Management Education  Visit Type:  First/Initial  Appt. Start Time: 10:00 Appt. End Time: 11:01  01/19/2018  [name], identified by name and date of birth, is a 67 y.o. female with a diagnosis of Diabetes: Type 2.   ASSESSMENT  Pt states she has a family history of diabetes-mom, grandmother, and brother. Pt states she has done a lot of research.   Pt states she entered cancer study in 2013 for stage 4 cancer. Pt states she is taking everolimus, an oral chemotherapy drug. Pt states her medications have increase glucosed, thyroid, cholesterol, and triglycerides. Pt states she was warned that if her numbers don't improve then they will have to take her off everolimus. Pt states she does not want to be taken off of medication nor have it reduced because she believes it is the reason she is still alive. Pt states she is the first patient in the world to try this medication. Pt states the study wants her FBS <150.   Pt states she does not like fish; uses olive oil and real butter. Pt states she is [place] and have bread and cheese.   Height 5\' 4"  (1.626 m), weight 153 lb 1.6 oz (69.4 kg). Body mass index is 26.28 kg/m.   Diabetes Self-Management Education - 01/19/18 1009      Initial Visit   What type of meal plan do you follow?  low carb, high fiber, high protein      Health Coping   How would you rate your overall health?  Fair      Psychosocial Assessment   Patient Belief/Attitude about Diabetes  Motivated to manage diabetes    Self-care barriers  None    Self-management support  Family    Patient Concerns  Nutrition/Meal planning    Special Needs  None    Preferred Learning Style  No preference indicated    Learning Readiness  Ready      Complications   Last HgB A1C per patient/outside source  9.9 %    How often do you check your blood sugar?  1-2 times/day    Fasting Blood glucose range (mg/dL)  130-179;180-200 136-200    Postprandial Blood glucose range  (mg/dL)  >200;180-200 186-297    Number of hypoglycemic episodes per month  0    Number of hyperglycemic episodes per week  0 not since taking metformin    Have you had a dilated eye exam in the past 12 months?  No    Have you had a dental exam in the past 12 months?  No    Are you checking your feet?  Yes    How many days per week are you checking your feet?  7      Dietary Intake   Breakfast  2 eggs with cheese    Snack (morning)  none    Lunch  chicken salad sandwich, bread with 15% less carbs, small salad or fruit    Snack (afternoon)  cheese, cashews or cottage cheese and fruit    Dinner  baked chicken, asparagus, broccoli, green beans, brussels sprouts, sometimes rice or sweet potato or salad with chicken    Snack (evening)  cottage cheese and sometimes fruit    Beverage(s)  water, coffee, 2% milk, pedialyte      Exercise   Exercise Type  Other (comment);Light (walking / raking leaves)  (Significant)  walking + strength training    How many days per week to you exercise?  5  How many minutes per day do you exercise?  45    Total minutes per week of exercise  225      Patient Education   Previous Diabetes Education  No    Nutrition management   Role of diet in the treatment of diabetes and the relationship between the three main macronutrients and blood glucose level;Food label reading, portion sizes and measuring food.;Carbohydrate counting    Monitoring  Purpose and frequency of SMBG.;Interpreting lab values - A1C, lipid, urine microalbumina.;Identified appropriate SMBG and/or A1C goals.;Taught/discussed recording of test results and interpretation of SMBG.;Yearly dilated eye exam;Daily foot exams    Acute complications  Taught treatment of hypoglycemia - the 15 rule.;Discussed and identified patients' treatment of hyperglycemia.    Chronic complications  Relationship between chronic complications and blood glucose control;Lipid levels, blood glucose control and heart  disease;Nephropathy, what it is, prevention of, the use of ACE, ARB's and early detection of through urine microalbumia.;Assessed and discussed foot care and prevention of foot problems;Retinopathy and reason for yearly dilated eye exams;Reviewed with patient heart disease, higher risk of, and prevention    Personal strategies to promote health  Lifestyle issues that need to be addressed for better diabetes care      Individualized Goals (developed by patient)   Nutrition  Follow meal plan discussed;General guidelines for healthy choices and portions discussed    Physical Activity  Exercise 5-7 days per week;60 minutes per day    Medications  take my medication as prescribed    Monitoring   test my blood glucose as discussed    Reducing Risk  examine blood glucose patterns;do foot checks daily;treat hypoglycemia with 15 grams of carbs if blood glucose less than 70mg /dL    Health Coping  Not Applicable      Post-Education Assessment   Patient understands the diabetes disease and treatment process.  Demonstrates understanding / competency    Patient understands incorporating nutritional management into lifestyle.  Demonstrates understanding / competency    Patient undertands incorporating physical activity into lifestyle.  Demonstrates understanding / competency    Patient understands using medications safely.  Demonstrates understanding / competency    Patient understands monitoring blood glucose, interpreting and using results  Demonstrates understanding / competency    Patient understands prevention, detection, and treatment of acute complications.  Demonstrates understanding / competency    Patient understands prevention, detection, and treatment of chronic complications.  Demonstrates understanding / competency    Patient understands how to develop strategies to address psychosocial issues.  Demonstrates understanding / competency    Patient understands how to develop strategies to promote  health/change behavior.  Demonstrates understanding / competency      Outcomes   Program Status  Completed       Learning Objective:  Patient will have a greater understanding of diabetes self-management. Patient education plan is to attend individual and/or group sessions per assessed needs and concerns.   Plan:   Patient Instructions  Goals:  Follow Diabetes Meal Plan as instructed  Eat 3 meals and 2 snacks, every 3-5 hrs  Limit carbohydrate intake to 45 grams carbohydrate/meal  Limit carbohydrate intake to 15 grams carbohydrate/snack  Add lean protein foods to meals/snacks  Monitor glucose levels as instructed by your doctor  Aim for 30 mins of physical activity daily  Bring food record and glucose log to your next nutrition visit     Expected Outcomes:  Demonstrated interest in learning. Expect positive outcomes  Education material provided: ADA Diabetes:  Your Take Control Guide, Meal plan card, Support group flyer and Carbohydrate counting sheet  If problems or questions, patient to contact team via:  Phone and Email  Future DSME appointment: - Yearly

## 2018-01-19 NOTE — Patient Instructions (Signed)
Goals:  Follow Diabetes Meal Plan as instructed  Eat 3 meals and 2 snacks, every 3-5 hrs  Limit carbohydrate intake to 45 grams carbohydrate/meal  Limit carbohydrate intake to 15 grams carbohydrate/snack  Add lean protein foods to meals/snacks  Monitor glucose levels as instructed by your doctor  Aim for 30 mins of physical activity daily  Bring food record and glucose log to your next nutrition visit

## 2018-01-28 IMAGING — CT CT CHEST W/ CM
2 of 5 series · 15 of 36 positions shown, 18 images · IV contrast (iopamidol)
Comparison: 02/20/2016

CLINICAL DATA: Followup metastatic right breast carcinoma. Oral
chemotherapy in progress. Restaging.

EXAM:
CT CHEST WITH CONTRAST
TECHNIQUE: Multidetector CT imaging of the chest was performed during
intravenous contrast administration.
CONTRAST:  75mL [id number] IOPAMIDOL ([id number]) INJECTION 61%

[Series 4: super d · axial · 0.78mm/px · z∈[-325,-29]mm · 12 of 350 slices shown, 15 images]
[im 27/350  mediastinal]
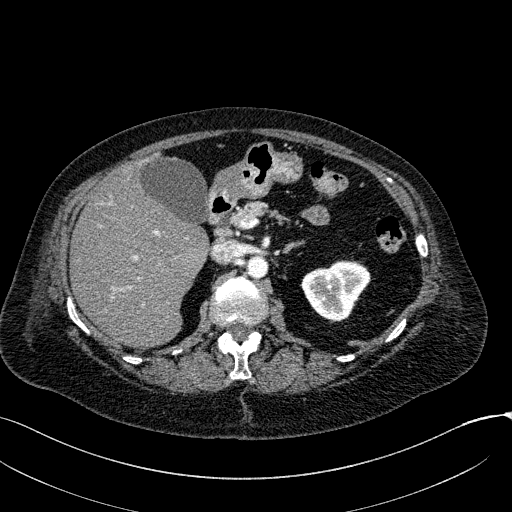
[im 27/350  lung]
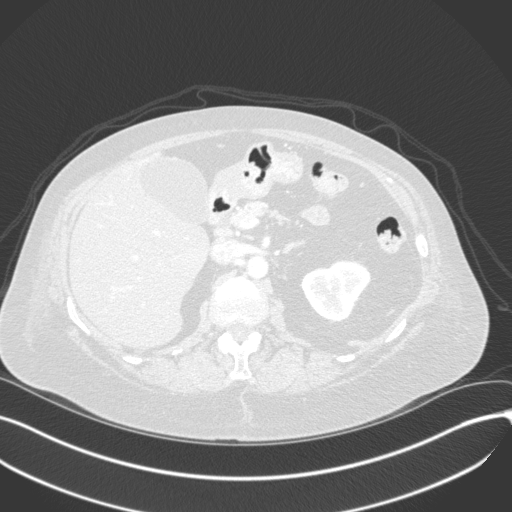
[im 54/350  lung]
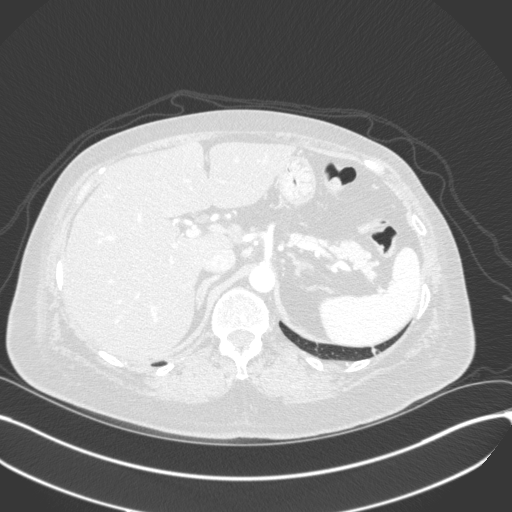
[im 81/350  lung]
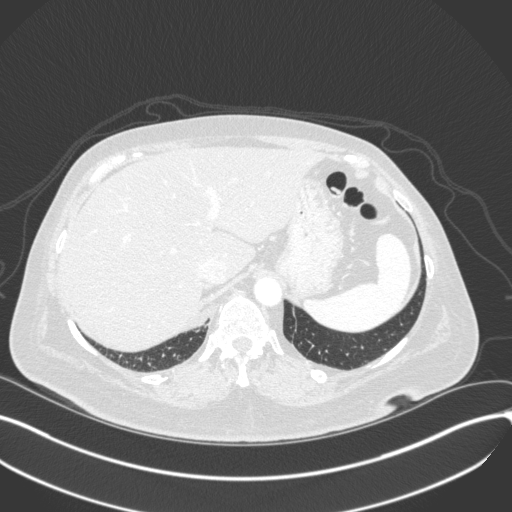
[im 108/350  lung]
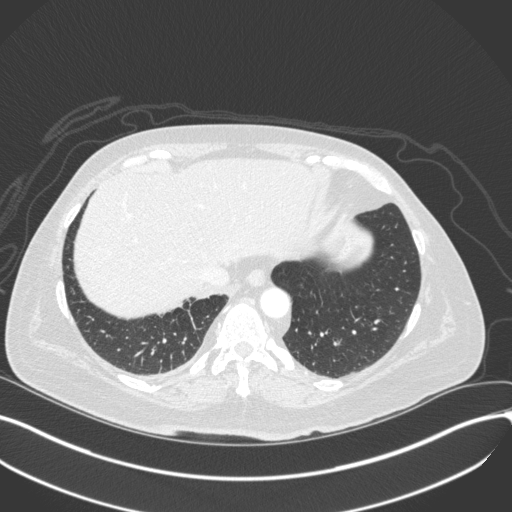
[im 135/350  mediastinal]
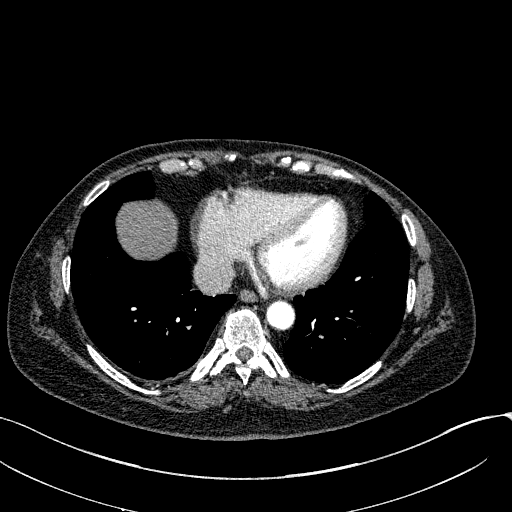
[im 135/350  lung]
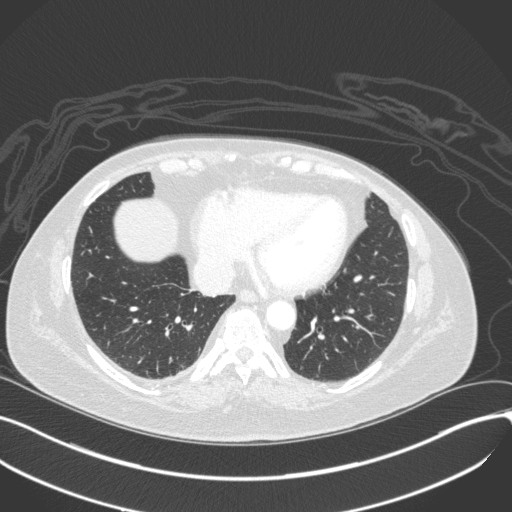
[im 162/350  lung]
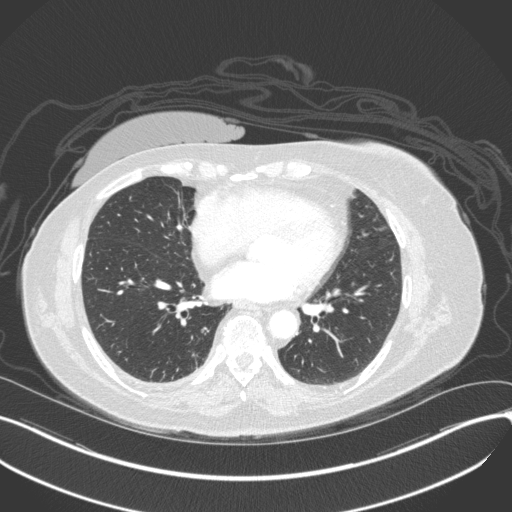
[im 188/350  lung]
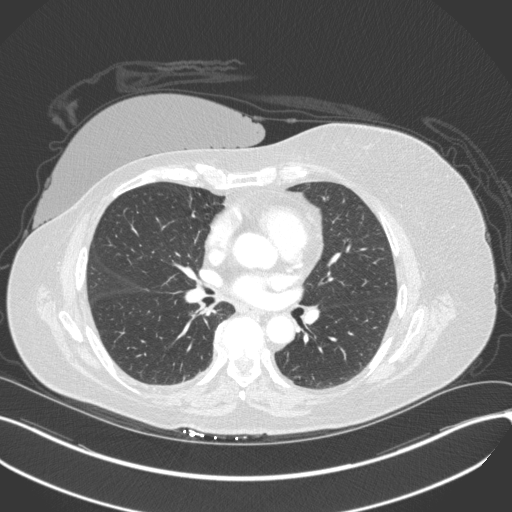
[im 215/350  lung]
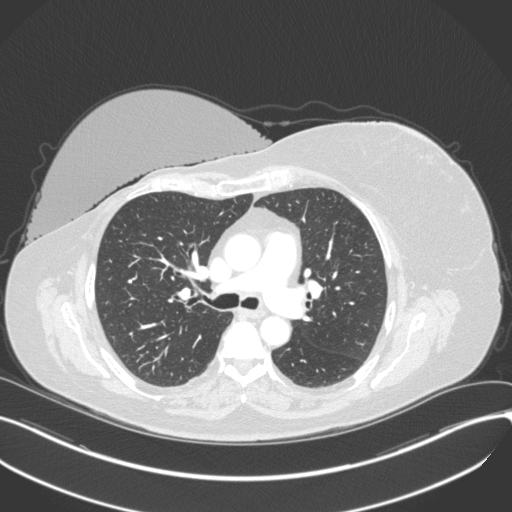
[im 242/350  mediastinal]
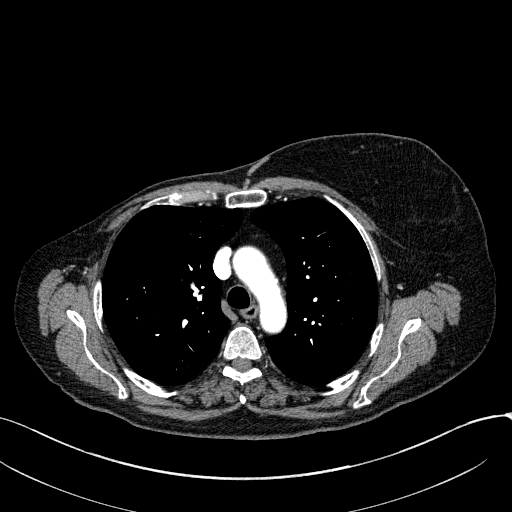
[im 242/350  lung]
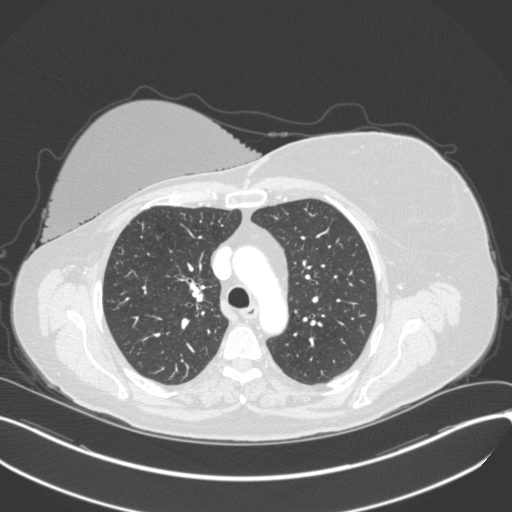
[im 269/350  lung]
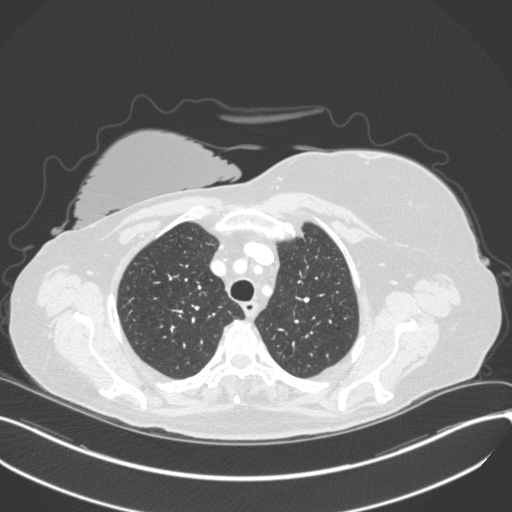
[im 296/350  lung]
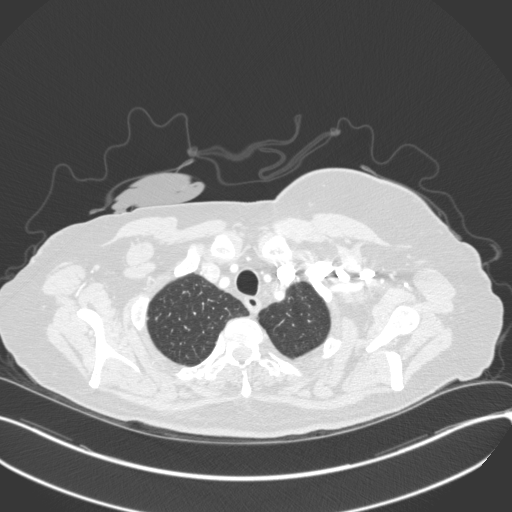
[im 323/350  lung]
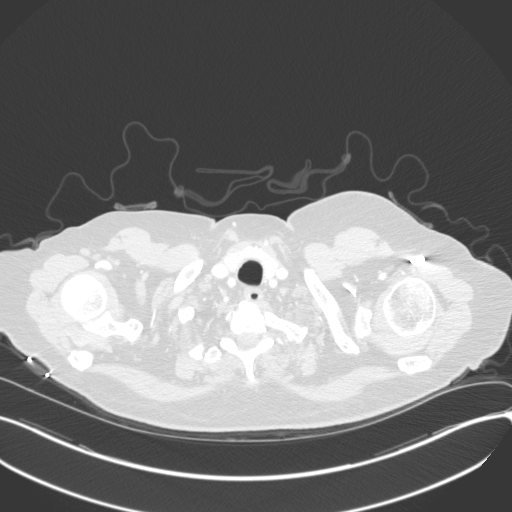

[Series 6: coronals · coronal · 0.67mm/px · 3 of 85 slices shown]
[im 17/85  lung]
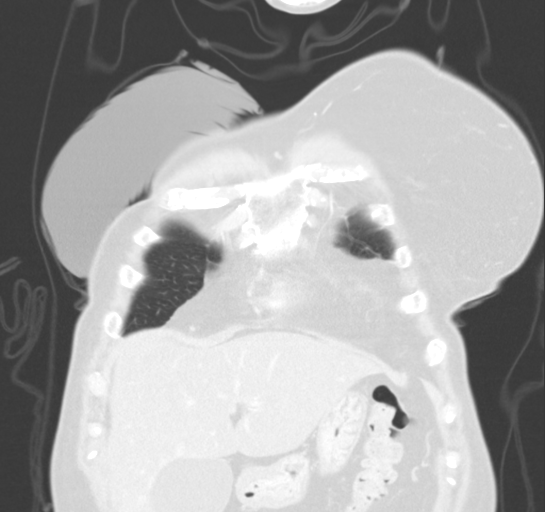
[im 34/85  lung]
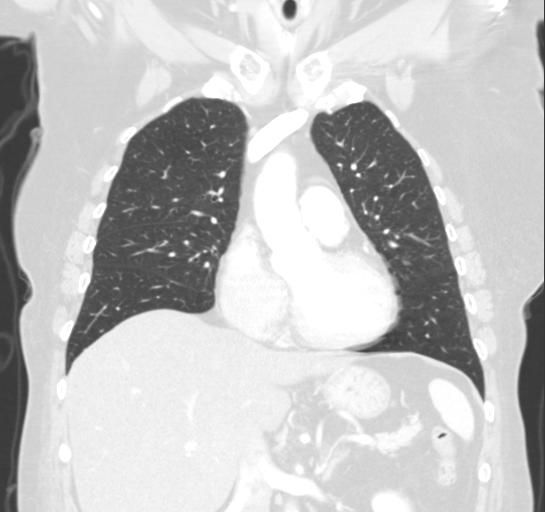
[im 51/85  lung]
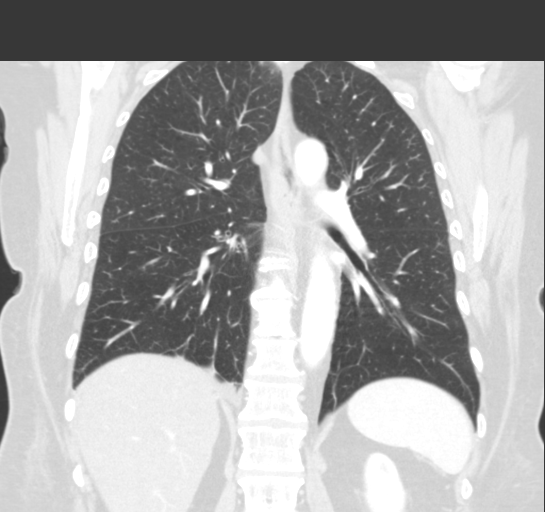

[15 of 36 positions shown; findings below may reference images not displayed]

FINDINGS: RECIST

Target Lesions:

1. Right lower lobe pulmonary nodule measuring 5 mm on image 102/5,
stable since prior exam
2. Subcarinal lymph node measuring 6 mm on image 74/2, stable since
prior exam
Non-target Lesions:

1. Stable mild right pleural thickening.
2. Stable clustered right upper lobe pulmonary nodules, largest
measuring 3 mm.
Cardiovascular:  No acute findings.

Mediastinum/Nodes: Previous right mastectomy and axillary lymph node
dissection. No masses or pathologically enlarged lymph nodes
identified.

Lungs/Pleura: Mild scarring again seen within the right middle and
lower lobes. 5 mm pulmonary nodule in posterior right lower lobe on
image 102/5 is stable. Cluster of tiny nodules in peripheral right
upper lobe are also stable, with largest measuring 3 mm on image
[DATE]. No new or enlarging pulmonary nodules or masses are
identified. No evidence pulmonary infiltrate or pleural effusion.
Mild right posterior and basilar pleural thickening remains stable.

Upper Abdomen: Stable 10 mm left adrenal nodule compared to previous
studies dating back to 11/02/2014, consistent with benign adrenal
adenoma. Stable diffuse hepatic steatosis.

Musculoskeletal:  No suspicious bone lesions.
IMPRESSION: Stable exam. No definite evidence of recurrent or metastatic
carcinoma within the thorax.

Stable tiny sub-cm right lung nodules. Recommend continued attention
on follow-up CT.

Stable hepatic steatosis and small benign left adrenal adenoma.

## 2018-02-24 ENCOUNTER — Other Ambulatory Visit: Payer: Self-pay | Admitting: Oncology

## 2018-03-04 ENCOUNTER — Telehealth: Payer: Self-pay | Admitting: Oncology

## 2018-03-04 NOTE — Telephone Encounter (Signed)
Scheduled appt per sch message r/s labs - left message for patient with appt date and time.

## 2018-03-17 ENCOUNTER — Ambulatory Visit (HOSPITAL_COMMUNITY)
Admission: RE | Admit: 2018-03-17 | Discharge: 2018-03-17 | Disposition: A | Discharge status: Home / Self Care | Payer: Medicare Other | Source: Ambulatory Visit | Attending: Adult Health | Admitting: Adult Health

## 2018-03-17 ENCOUNTER — Inpatient Hospital Stay: Payer: Medicare Other | Attending: Oncology

## 2018-03-17 ENCOUNTER — Encounter (HOSPITAL_COMMUNITY): Payer: Self-pay

## 2018-03-17 DIAGNOSIS — C7801 Secondary malignant neoplasm of right lung: Secondary | ICD-10-CM | POA: Diagnosis not present

## 2018-03-17 DIAGNOSIS — Z9221 Personal history of antineoplastic chemotherapy: Secondary | ICD-10-CM | POA: Insufficient documentation

## 2018-03-17 DIAGNOSIS — C50411 Malignant neoplasm of upper-outer quadrant of right female breast: Secondary | ICD-10-CM | POA: Diagnosis not present

## 2018-03-17 DIAGNOSIS — I700 Atherosclerosis of aorta: Secondary | ICD-10-CM | POA: Insufficient documentation

## 2018-03-17 DIAGNOSIS — Z170 Estrogen receptor positive status [ER+]: Secondary | ICD-10-CM

## 2018-03-17 DIAGNOSIS — C7800 Secondary malignant neoplasm of unspecified lung: Secondary | ICD-10-CM | POA: Diagnosis not present

## 2018-03-17 DIAGNOSIS — I3131 Malignant pericardial effusion in diseases classified elsewhere: Secondary | ICD-10-CM

## 2018-03-17 DIAGNOSIS — C50911 Malignant neoplasm of unspecified site of right female breast: Secondary | ICD-10-CM

## 2018-03-17 DIAGNOSIS — I313 Pericardial effusion (noninflammatory): Secondary | ICD-10-CM

## 2018-03-17 DIAGNOSIS — R7989 Other specified abnormal findings of blood chemistry: Secondary | ICD-10-CM | POA: Insufficient documentation

## 2018-03-17 DIAGNOSIS — Z79811 Long term (current) use of aromatase inhibitors: Secondary | ICD-10-CM | POA: Insufficient documentation

## 2018-03-17 DIAGNOSIS — M5430 Sciatica, unspecified side: Secondary | ICD-10-CM | POA: Insufficient documentation

## 2018-03-17 DIAGNOSIS — Z006 Encounter for examination for normal comparison and control in clinical research program: Secondary | ICD-10-CM | POA: Insufficient documentation

## 2018-03-17 DIAGNOSIS — C801 Malignant (primary) neoplasm, unspecified: Secondary | ICD-10-CM

## 2018-03-17 LAB — CBC WITH DIFFERENTIAL/PLATELET
Basophils Absolute: 0.1 10*3/uL (ref 0.0–0.1)
Basophils Relative: 1 %
Eosinophils Absolute: 0.1 10*3/uL (ref 0.0–0.5)
Eosinophils Relative: 1 %
HEMATOCRIT: 42.4 % (ref 34.8–46.6)
HEMOGLOBIN: 14.1 g/dL (ref 11.6–15.9)
LYMPHS ABS: 1.2 10*3/uL (ref 0.9–3.3)
LYMPHS PCT: 15 %
MCH: 25.9 pg (ref 25.1–34.0)
MCHC: 33.2 g/dL (ref 31.5–36.0)
MCV: 78 fL — AB (ref 79.5–101.0)
Monocytes Absolute: 0.8 10*3/uL (ref 0.1–0.9)
Monocytes Relative: 11 %
NEUTROS ABS: 5.8 10*3/uL (ref 1.5–6.5)
Neutrophils Relative %: 72 %
PLATELETS: 256 10*3/uL (ref 145–400)
RBC: 5.44 MIL/uL (ref 3.70–5.45)
RDW: 14 % (ref 11.2–14.5)
WBC: 8 10*3/uL (ref 3.9–10.3)

## 2018-03-17 LAB — COMPREHENSIVE METABOLIC PANEL
ALBUMIN: 3.8 g/dL (ref 3.5–5.0)
ALT: 27 U/L (ref 0–44)
AST: 34 U/L (ref 15–41)
Alkaline Phosphatase: 201 U/L — ABNORMAL HIGH (ref 38–126)
Anion gap: 11 (ref 5–15)
BUN: 26 mg/dL — AB (ref 8–23)
CHLORIDE: 107 mmol/L (ref 98–111)
CO2: 21 mmol/L — ABNORMAL LOW (ref 22–32)
Calcium: 10.2 mg/dL (ref 8.9–10.3)
Creatinine, Ser: 0.96 mg/dL (ref 0.44–1.00)
GFR calc Af Amer: >60 mL/min (ref >60)
GFR calc non Af Amer: 60 mL/min — ABNORMAL LOW (ref >60)
Glucose, Bld: 140 mg/dL — ABNORMAL HIGH (ref 70–99)
POTASSIUM: 4.3 mmol/L (ref 3.5–5.1)
Sodium: 139 mmol/L (ref 135–145)
Total Bilirubin: 0.3 mg/dL (ref 0.3–1.2)
Total Protein: 8.2 g/dL — ABNORMAL HIGH (ref 6.5–8.1)

## 2018-03-17 MED ORDER — IOHEXOL 300 MG/ML  SOLN
75.0000 mL | Freq: Once | INTRAMUSCULAR | Status: AC | PRN
  Administered 2018-03-17: 75 mL via INTRAVENOUS

## 2018-03-18 LAB — CANCER ANTIGEN 27.29: CA 27.29: 79.2 U/mL — ABNORMAL HIGH (ref 0.0–38.6)

## 2018-03-18 NOTE — Progress Notes (Signed)
Barbara Parks  [phone] Fax:(336) [phone]     ID: Barbara Parks DOB: April 21, 1951  MR#: [id number]  QRF#:[id number]  Patient Care Team: Darcus Austin, MD as PCP - General (Family Medicine) Jerline Pain, MD (Cardiology) Gaye Pollack, MD (Cardiothoracic Surgery) Magrinat, Virgie Dad, MD as Consulting Physician (Oncology) OTHER MD: Dr Mitzie Na, DDS; Gaynelle Arabian MD  CHIEF COMPLAINT: metastatic breast cancer, on BOLERO study  CURRENT TREATMENT: letrozole + everolimus  BREAST CANCER HISTORY: From Dr. Collier Salina Parks's original intake note 04/13/2004:  "This woman has been in good health all of her life. She recently moved from [place] to work here.  She palpated a mass at about the 12 o'clock position in mid-July. She has not noticed any nipple retraction or skin changes.  She was seen by her primary care doctor who subsequently referred her for a mammogram.  Mammogram was performed on 03/01/04. This demonstrated a spiculated 2 cm mass at the 12 o'clock position in the right breast.  Upper outer quadrant of the left breast shows some distortion.   Physical exam at that time showed a firm, nontender nodule at the 12 o'clock position in the right breast, 5 cm from the nipple.  Ultrasound of this area showed a hypoechoic ill-defined mass, measuring 2.2 x 1.3 x 1.4 cm.  Physical exam of the left breast showed general vague thickening, upper outer quadrant of the left breast with a discrete palpable mass. The ultrasound performed showed a single hypoechoic ill-defined nodule at the 1 o'clock position, measuring 7 x 5 x 6 mm.  She had biopsies of both lesions on 03/02/03.  Needle core biopsy of the lesion on the right breast revealed invasive mammary carcinoma.  Needle core biopsy of the left breast showed a complex fibroadenoma.  Prognostic panel of the lesion on the right breast showed it to be ER positive at 73%, PR positive at 90% and proliferative index 9%,  HER-2 was 1+.  Patient was referred to Dr. Annamaria Parks, who performed a simple mastectomy with sentinel lymph node evaluation on 03/22/04.  Final pathology showed this to be an invasive ductal carcinoma  with lobular features, measuring 2.2 cm, grade 2 of 3.  Margins negative for carcinoma.  Invasive ductal carcinoma was extended to involve deep dermis of the nipple.  Lymphovascular invasion was identified.  Total of 4 sentinel lymph nodes were evaluated.  Touch imprints at the time of the OR was felt to be negative.  Subsequent evaluation showed a 5 mm focus of metastatic carcinoma in one of the four lymph nodes on microscopic after sectioning.  There was extracapsular extension  of one lymph node as well. The remaining three lymph nodes were all negative."  The patient's subsequent history is detailed below.  INTERVAL HISTORY: Barbara Parks returns today for follow-up and treatment of her metastatic estrogen receptor positive breast cancer. She is participating in the [place] trial. She continues on letrozole, with good tolerance. She has occasional night hot flashes, but they are not intense. She denies issues with vaginal dryness.   In addition to letrozole, she takes everolimus, 10 mg daily. She also tolerates this well. She denies skin rashes, cough, mouth sores, or diarrhea.   Since her last visit to the office, she underwent a CT Chest w contrast on 03/17/2018 with results showing: Stable 7 x 13 mm nodule in the posterior right lung base, stable 5 mm right lower lobe nodule, and stable 6 mm short axis subcarinal node. Aortic Atherosclerosis (  ICD10-I70.0).  She is clearly continuing to benefit from her treatment.   REVIEW OF SYSTEMS: Barbara Parks reports that she has has back pain over the last week. The pain extends down her right leg. She notes that she has been driving a lot recently. She notes that when she goes to the orthopedist, they offer pain medications, but she would prefer not to take medicine. She takes  ibuprofen if she has pain. She also asks for traction devices, which help for her neck. She enjoys playing golf for exercise, but the weather has been hot. She denies unusual headaches, visual changes, nausea, vomiting, or dizziness. There has been no unusual cough, phlegm production, or pleurisy. This been no change in bowel or bladder habits. She denies unexplained fatigue or unexplained weight loss, bleeding, rash, or fever. A detailed review of systems was otherwise stable.     PAST MEDICAL HISTORY: Past Medical History:  Diagnosis Date  . Arthritis    left hip  . Asthma   . breast ca 2005   breast/chemo R mastectomy  . Dermatitis   . Diabetes mellitus without complication ([place])   . GERD (gastroesophageal reflux disease)   . Hypercholesteremia   . Metastasis to lung ([place]) dx'd 08/2011  . Osteopenia due to cancer therapy 09/09/2013  . Palpitations   . Shortness of breath     PAST SURGICAL HISTORY: Past Surgical History:  Procedure Laterality Date  . BREAST SURGERY  2005   right  . CATARACT EXTRACTION W/PHACO Left 01/18/2014   Procedure: CATARACT EXTRACTION PHACO AND INTRAOCULAR LENS PLACEMENT (IOC);  Surgeon: Elta Guadeloupe T. Gershon Crane, MD;  Location: [facility];  Service: Ophthalmology;  Laterality: Left;  CDE 15.79  . CATARACT EXTRACTION W/PHACO Right 02/08/2014   Procedure: CATARACT EXTRACTION PHACO AND INTRAOCULAR LENS PLACEMENT (IOC);  Surgeon: Elta Guadeloupe T. Gershon Crane, MD;  Location: [facility];  Service: Ophthalmology;  Laterality: Right;  CDE 4.41  . CHEST TUBE INSERTION  09/11/2011   Procedure: INSERTION PLEURAL DRAINAGE CATHETER;  Surgeon: Gaye Pollack, MD;  Location: [facility];  Service: Thoracic;  Laterality: Right;  . PERICARDIAL WINDOW  09/11/2011   Procedure: PERICARDIAL WINDOW;  Surgeon: Gaye Pollack, MD;  Location: [facility] OR;  Service: Thoracic;  Laterality: N/A;  . REMOVAL OF PLEURAL DRAINAGE CATHETER  12/19/2011   Procedure: REMOVAL OF PLEURAL DRAINAGE CATHETER;  Surgeon: Gaye Pollack, MD;   Location: [facility];  Service: Thoracic;  Laterality: Right;  TO BE DONE IN MINOR ROOM, SHORT STAY  . [place]  . TOTAL HIP ARTHROPLASTY Left 06/07/2015   Procedure: LEFT TOTAL HIP ARTHROPLASTY ANTERIOR APPROACH;  Surgeon: Gaynelle Arabian, MD;  Location: [facility];  Service: Orthopedics;  Laterality: Left;  Marland Kitchen VIDEO BRONCHOSCOPY  09/11/2011   Procedure: VIDEO BRONCHOSCOPY;  Surgeon: Gaye Pollack, MD;  Location: [facility];  Service: Thoracic;  Laterality: N/A;    FAMILY HISTORY Family History  Problem Relation Age of Onset  . Cancer Mother        breast  . Heart disease Father   . Diabetes Other   . Anesthesia problems Neg Hx    the patient's mother was diagnosed with breast cancer at age 76, she died age 91 from congestive heart failure..The patient's father died from heart disease at age 63.  She has one sister alive & well.  Two brothers alive & well, one with diabetes. The patient's sister was also diagnosed with breast cancer, and was tested for the BRCA gene, and was negative. The  patient herself has not been tested. There is no history of ovarian cancer in the family  GYNECOLOGIC HISTORY:  No LMP recorded. Patient is postmenopausal. Menarche age 53, the patient is GX P0. She stopped having periods with chemotherapy in 2005. 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 She was a Archivist. She is single, lives alone with her Shitzu-poodle Sammie.. Family is all in the [place] area.  Note her brother is currently under treatment as discussed today    ADVANCED DIRECTIVES: Not in place. At the 02/23/2014 visit the patient was given the appropriate documents to complete and notarize at her discretion. She tells me she is planning to name her sister, Billie Ruddy, as healthcare power of attorney. Barbara Parks can be reached at [phone]   HEALTH MAINTENANCE: Social History    Tobacco Use  . Smoking status: Former Smoker    Packs/day: 1.50    Years: 30.00    Pack years: 45.00    Types: Cigarettes    Last attempt to quit: 09/10/2007    Years since quitting: 10.5  . Smokeless tobacco: Never Used  Substance Use Topics  . Alcohol use: No  . Drug use: No     Colonoscopy:  PAP:  Bone density: 08/14/2015, T score -1.6  Lipid panel:  Allergies  Allergen Reactions  . Aspirin     REACTION: upset stomach  . Latex Other (See Comments)    Blistering and skin peels off  . Nsaids Nausea And Vomiting    Extreme nausea and vomiting    Current Outpatient Medications  Medication Sig Dispense Refill  . blood glucose meter kit and supplies KIT Dispense based on patient and insurance preference. Use up to four times daily as directed. (FOR ICD-9 250.00, 250.01). 1 each 0  . cholecalciferol (VITAMIN D) 1000 units tablet Take 1,000 Units by mouth 2 (two) times daily.    Marland Kitchen gemfibrozil (LOPID) 600 MG tablet TAKE 1 TABLET BY MOUTH TWICE DAILY BEFORE MEAL(S) 180 tablet 1  . Investigational everolimus (RAD001) 5 MG tablet Novartis [id number] Take 2 tablets by mouth daily. Take with a glass of water. 168 tablet 0  . letrozole (FEMARA) 2.5 MG tablet Take 1 tablet (2.5 mg total) by mouth daily. 90 tablet 3  . loperamide (IMODIUM) 2 MG capsule Take 2 mg by mouth 4 (four) times daily as needed for diarrhea or loose stools. Reported on 11/30/2015    . metFORMIN (GLUCOPHAGE-XR) 500 MG 24 hr tablet TAKE 1 TABLET BY MOUTH ONCE DAILY WITH  EVENING  MEAL  3  . rosuvastatin (CRESTOR) 5 MG tablet TAKE 1 TABLET BY MOUTH EVERY DAY 30 tablet 1   No current facility-administered medications for this visit.     OBJECTIVE: Middle-aged white woman who appears stated age 67:   03/19/18 0850 03/19/18 0851  BP: 116/64 109/66  Pulse:    Resp:    Temp:    SpO2:       Body mass index is 25.32 kg/m.    ECOG FS:1 - Symptomatic but completely ambulatory  Sclerae unicteric, EOMs  intact Oropharynx clear and moist No cervical or supraclavicular adenopathy Lungs no rales or rhonchi Heart regular rate and rhythm Abd soft, nontender, positive bowel sounds MSK no focal spinal tenderness, no upper extremity lymphedema Neuro: nonfocal, well oriented, appropriate affect Breasts: The right breast is status post mastectomy.  There is  no evidence of chest wall recurrence.  Left breast is benign.  Both axillae are benign.     LAB  RESULTS:  CMP     Component Value Date/Time   NA 139 03/17/2018 1041   NA 139 07/08/2017 0942   K 4.3 03/17/2018 1041   K 3.8 07/08/2017 0942   CL 107 03/17/2018 1041   CL 109 (H) 12/31/2012 0940   CO2 21 (L) 03/17/2018 1041   CO2 18 (L) 07/08/2017 0942   GLUCOSE 140 (H) 03/17/2018 1041   GLUCOSE 156 (H) 07/08/2017 0942   GLUCOSE 127 (H) 12/31/2012 0940   BUN 26 (H) 03/17/2018 1041   BUN 17.7 07/08/2017 0942   CREATININE 0.96 03/17/2018 1041   CREATININE 1.18 (H) 12/25/2017 0959   CREATININE 1.0 07/08/2017 0942   CALCIUM 10.2 03/17/2018 1041   CALCIUM 10.4 07/08/2017 0942   PROT 8.2 (H) 03/17/2018 1041   PROT 7.7 07/08/2017 0942   ALBUMIN 3.8 03/17/2018 1041   ALBUMIN 3.4 (L) 07/08/2017 0942   AST 34 03/17/2018 1041   AST 50 (H) 12/25/2017 0959   AST 54 (H) 07/08/2017 0942   ALT 27 03/17/2018 1041   ALT 53 12/25/2017 0959   ALT 39 07/08/2017 0942   ALKPHOS 201 (H) 03/17/2018 1041   ALKPHOS 232 (H) 07/08/2017 0942   BILITOT 0.3 03/17/2018 1041   BILITOT 0.3 12/25/2017 0959   BILITOT 0.42 07/08/2017 0942   GFRNONAA 60 (L) 03/17/2018 1041   GFRNONAA 47 (L) 12/25/2017 0959   GFRAA >60 03/17/2018 1041   GFRAA 54 (L) 12/25/2017 0959    I No results found for: SPEP  Lab Results  Component Value Date   WBC 8.0 03/17/2018   NEUTROABS 5.8 03/17/2018   HGB 14.1 03/17/2018   HCT 42.4 03/17/2018   MCV 78.0 (L) 03/17/2018   PLT 256 03/17/2018      Chemistry      Component Value Date/Time   NA 139 03/17/2018 1041   NA  139 07/08/2017 0942   K 4.3 03/17/2018 1041   K 3.8 07/08/2017 0942   CL 107 03/17/2018 1041   CL 109 (H) 12/31/2012 0940   CO2 21 (L) 03/17/2018 1041   CO2 18 (L) 07/08/2017 0942   BUN 26 (H) 03/17/2018 1041   BUN 17.7 07/08/2017 0942   CREATININE 0.96 03/17/2018 1041   CREATININE 1.18 (H) 12/25/2017 0959   CREATININE 1.0 07/08/2017 0942      Component Value Date/Time   CALCIUM 10.2 03/17/2018 1041   CALCIUM 10.4 07/08/2017 0942   ALKPHOS 201 (H) 03/17/2018 1041   ALKPHOS 232 (H) 07/08/2017 0942   AST 34 03/17/2018 1041   AST 50 (H) 12/25/2017 0959   AST 54 (H) 07/08/2017 0942   ALT 27 03/17/2018 1041   ALT 53 12/25/2017 0959   ALT 39 07/08/2017 0942   BILITOT 0.3 03/17/2018 1041   BILITOT 0.3 12/25/2017 0959   BILITOT 0.42 07/08/2017 0942       Lab Results  Component Value Date   LABCA2 58 (H) 09/14/2012    No components found for: [id number]  No results for input(s): INR in the last 168 hours.  Urinalysis    Component Value Date/Time   COLORURINE YELLOW 11/25/2017 0901   APPEARANCEUR HAZY (A) 11/25/2017 0901   LABSPEC 1.017 11/25/2017 0901   LABSPEC 1.030 07/13/2015 0841   PHURINE 5.0 11/25/2017 0901   GLUCOSEU NEGATIVE 11/25/2017 0901   GLUCOSEU Negative 07/13/2015 0841   HGBUR MODERATE (A) 11/25/2017  [place] 11/25/2017 0901   BILIRUBINUR Negative 07/13/2015 0841   KETONESUR NEGATIVE 11/25/2017 0901   PROTEINUR 100 (A) 11/25/2017 0901   UROBILINOGEN 0.2 07/13/2015 0841   NITRITE NEGATIVE 11/25/2017 0901   LEUKOCYTESUR NEGATIVE 11/25/2017 0901   LEUKOCYTESUR Negative 07/13/2015 0841    STUDIES: Ct Chest W Contrast  Result Date: 03/17/2018 CLINICAL DATA:  Metastatic breast cancer EXAM: CT CHEST WITH CONTRAST TECHNIQUE: Multidetector CT imaging of the chest was performed during intravenous contrast administration. CONTRAST:  1m OMNIPAQUE IOHEXOL 300 MG/ML  SOLN COMPARISON:  09/30/2017 FINDINGS: RECIST 1.0 Target Lesions: 1. 5 mm  right lower lobe nodule (series 5/image 102), unchanged 2. 7 x 13 mm nodule in the posterior right lung base (series 5/image 139), unchanged 3. 6 mm short axis subcarinal node (series 2/image 36), unchanged Non-target Lesions: 1. Mild right posterior pleural thickening, unchanged Cardiovascular: The heart is normal in size. No pericardial effusion. No evidence of thoracic aortic aneurysm. Mild atherosclerotic calcifications of the aortic arch. Mediastinum/Nodes: No suspicious mediastinal or hilar lymphadenopathy. 6 mm short axis subcarinal node (series 2/image 36), poorly visualized, grossly unchanged. Status post right axillary lymph node dissection. Lungs/Pleura: 7 x 13 mm nodule in the posterior right lung base (series 5/image 139), unchanged. Additional 5 mm subpleural nodule in the posterior right lower lobe (series 5/image 102), unchanged. No focal consolidation.  No pleural effusion or pneumothorax. Upper Abdomen: Visualized upper abdomen is grossly unremarkable, noting a stable 14 mm left adrenal nodule, likely reflecting a benign adrenal adenoma. Musculoskeletal: Mild degenerative changes of the lower thoracic spine. IMPRESSION: Stable 7 x 13 mm nodule in the posterior right lung base. Additional stable ancillary findings as above. RECIST 1.0 measurements, as above. Aortic Atherosclerosis (ICD10-I70.0). Electronically Signed   By: SJulian HyM.D.   On: 03/17/2018 15:08     ASSESSMENT: 67y.o. Barbara Parks, Barbara Parks woman with stage IV breast cancer, on BOLERO-4 trial  (1) status post right mastectomy and sentinel lymph node sampling 03/22/2004 for a right upper-outer quadrant pT2 pN1, stage IIB invasive ductal carcinoma with lobular features, grade 2, estrogen receptor and progesterone receptor positive, HER-2 negative, with an MIB-1 of 9% (([id number] [id number]  (2) addition all right axillary lymph node sampling 05/07/2004 showed 2 benign lymph nodes (4 lymph nodes previously removed, so total was one  positive lymph node out of 6; [id number])  (3) the patient was evaluated by radiation oncology; no postmastectomy radiation recommended  (4) adjuvant chemotherapy with dose dense doxorubicin and cyclophosphamide x4 cycles (first cycle delayed one week) followed by dose dense paclitaxel x4 was completed 09/18/2004  (5) tamoxifen started March 2006, discontinued 2009  METASTATIC DISEASE: February 2013 (6) presenting with a large pericardial effusion, large right pleural effusion and possible right middle lobe bronchial obstruction February 2013, status post pericardial window placement, fiberoptic bronchoscopy and right Pleurx placement 09/11/2011, with biopsy of the bronchus intermedius and pericardium positive for metastatic breast cancer, estrogen receptor 91% positive with moderate staining intensity, progesterone receptor 100% positive with strong staining intensity, with an MIB-1 of 35%, and no HER-2 amplification, the signals ratio being 1.37 ([id number])  (7) enrolled in BOLERO-4 trial 10/10/2011, receiving letrozole and everolimus  (a) two small areas of enhancement in the cerebellum noted by brain MRI 10/03/2011 were no longer apparent on repeat MRI 08/21/2012-- most recent brain MRI 04/01/2014 showed no evidence of intracranial metastatic disease  (b) sclerotic lesions in left iliac bone and sacrum have not been biopsied; stable; plan is  to start zolendronate after patient updates her dental care (extraction planned)  (c) RLL lung nodule, stable (rescanned 07/12/2014 and 08/11/2014) R hilar and subcarinal lymph nodes: stable  (d) most recent CT of the chest: 03/25/2018, stable (9) additional problems:  (a) hepatic steatosis  (b) COPD/ emphysema/ asthma  (c) advanced L hip osteoarthritis, status post left total hip replacement 06/07/2015  (d) aortoiliac atherosclerosis  (e) dental evaluation pending w possible dental extractions  (f) likely thalassemia trait  (10) Bone density  concerns: DEXA scan 08/11/2013 was normal  (a) repeat DEXA scan 04/09/2018 results pending  (11) likely thalassemia trait  (12) hepatic steatosis.  PLAN:  Barbara Parks is now 6-1/2 years out from definitive diagnosis of metastatic breast cancer, with very stable disease.  She is tolerating her letrozole and everolimus remarkably well and continues to benefit from these, with the most recent CT scan showing absolutely no disease progression  We discussed her lab work which includes a low MCV almost certainly due to thalassemia trait.  Her alkaline phosphatase is stable and her transaminases are improved.  We were not able to obtain a lipid panel today but I wrote the order so this can be drawn at her primary care physicians Dr. Inda Merlin at the next visit which is coming up briefly  Barbara Parks will have mammography and a bone density in mid September.  I offered to refer her to neurosurgery and pain clinic for her sciatica but at this point she refuses.    She will see me again in 3 months and her next scan will be 6 months from now.  Medical Oncology and Hematology [facility] [address] [place],  [address] Tel. [phone]    Fax. [phone]  Alice Rieger, am acting as scribe for Chauncey Cruel MD.  I, Lurline Del MD, have reviewed the above documentation for accuracy and completeness, and I agree with the above.

## 2018-03-19 ENCOUNTER — Encounter: Payer: Self-pay | Admitting: *Deleted

## 2018-03-19 ENCOUNTER — Inpatient Hospital Stay (HOSPITAL_BASED_OUTPATIENT_CLINIC_OR_DEPARTMENT_OTHER): Payer: Medicare Other | Admitting: Oncology

## 2018-03-19 ENCOUNTER — Other Ambulatory Visit: Payer: Medicare Other

## 2018-03-19 ENCOUNTER — Telehealth: Payer: Self-pay | Admitting: Oncology

## 2018-03-19 VITALS — BP 109/66 | HR 89 | Temp 97.9°F | Resp 16 | Ht 64.0 in | Wt 147.5 lb

## 2018-03-19 DIAGNOSIS — M5430 Sciatica, unspecified side: Secondary | ICD-10-CM

## 2018-03-19 DIAGNOSIS — R7989 Other specified abnormal findings of blood chemistry: Secondary | ICD-10-CM | POA: Diagnosis not present

## 2018-03-19 DIAGNOSIS — C50911 Malignant neoplasm of unspecified site of right female breast: Secondary | ICD-10-CM

## 2018-03-19 DIAGNOSIS — C7800 Secondary malignant neoplasm of unspecified lung: Principal | ICD-10-CM

## 2018-03-19 DIAGNOSIS — I3131 Malignant pericardial effusion in diseases classified elsewhere: Secondary | ICD-10-CM

## 2018-03-19 DIAGNOSIS — Z006 Encounter for examination for normal comparison and control in clinical research program: Secondary | ICD-10-CM

## 2018-03-19 DIAGNOSIS — Z9221 Personal history of antineoplastic chemotherapy: Secondary | ICD-10-CM | POA: Diagnosis not present

## 2018-03-19 DIAGNOSIS — I313 Pericardial effusion (noninflammatory): Principal | ICD-10-CM

## 2018-03-19 DIAGNOSIS — Z170 Estrogen receptor positive status [ER+]: Secondary | ICD-10-CM | POA: Diagnosis not present

## 2018-03-19 DIAGNOSIS — C7801 Secondary malignant neoplasm of right lung: Secondary | ICD-10-CM

## 2018-03-19 DIAGNOSIS — C801 Malignant (primary) neoplasm, unspecified: Secondary | ICD-10-CM

## 2018-03-19 DIAGNOSIS — C50411 Malignant neoplasm of upper-outer quadrant of right female breast: Secondary | ICD-10-CM | POA: Diagnosis not present

## 2018-03-19 DIAGNOSIS — Z79811 Long term (current) use of aromatase inhibitors: Secondary | ICD-10-CM

## 2018-03-19 MED ORDER — INV-EVEROLIMUS (RAD0001) 5MG TABLET NOVARTIS CRAD001Y24135: 2.0000 | ORAL_TABLET | Freq: Every day | ORAL | 0 refills | Status: DC

## 2018-03-19 MED ORDER — LETROZOLE 2.5 MG PO TABS: 2.5000 mg | ORAL_TABLET | Freq: Every day | ORAL | 3 refills | Status: DC

## 2018-03-19 NOTE — Progress Notes (Signed)
03/19/2018 at 9:35am - Novartis/Bolero 4 - Extension Phase#11visit  Thepatientwasintotheclinicthismorningunaccompanied forherroutine 12-week follow-up visit and study drug dispensing. Patient returned 6 kits of everolimus with0tablets returned. The pt's overall 12 week study drug compliance is 100%for the date range of5/30/2019-03/18/2018. Patient returned paper calendarsdocumenting daily doses of letrozole 2.5 mg and everolimus 10 mg with no missed doses.Medication boxes were returned to thepharmacy for drug accountability by Joretta Bachelor.The pt said that she is feeling well today.Patient reports the previously noted AE's (fatigue,hot flashes, dry skin, back pain, and memory impairment)as ongoingand unchanged in severity. Dr. Jana Hakim offered to refer the pt to Dr. Maryjean Ka for her back pain, but the pt declined the need to see the doctor.  The pt said that she is using at-home traction for her back pain with some relief.  Patient reports her blurry vision in her left eye is still ongoing.She has not yet scheduled an appointment with her ophthalmologist for additional follow up.Based on lab results review and history and physical byDr. Jana Hakim, thepatient'scondition is acceptable for continued treatment. Dr. Jana Hakim reviewed the pt's CT chest done on 03/17/18.  He feels that her scans do not show any evidence of progressive disease. In hisopinion,she is still receiving benefit from her study treatment. Dr. Jana Hakim said that he will repeat her CT chest in 6 months.  The pt will have labs drawn on 03/23/18 in her PCP's office for monitoring of her diabetes.  Dr. Jana Hakim wrote a prescription for the pt to have her lipid panel drawn at her PCP's office since our lab did not draw the lipid panel on 03/17/2018.       Six kits of study drug everolimus were dispensed today for the next 12 weeks supply. In addition, patient will continue commercial supply of  letrozole 2.5mg  daily. The next study visit is expected to occur on11/14/2019.  The pt is also scheduled for her mammogram and bone density.  The research nurse thankedthepatient for hercontinuedparticipationin the study. Brion Aliment RN, BSN, CCRP Clinical Research Nurse 03/19/2018 9:43 AM   03/25/18 at 10:45am - Received pt's lipid panel results from Dr. Moses Manners office this morning (lipid panel drawn on 03/23/18).  Dr. Jana Hakim notified of the results.  The pt's lipid panel results have improved. The pt's cholesterol is a grade 1 (grade 1 at baseline).  The pt's triglycerides dropped to a grade 1 AE (grade 1 at baseline).  Brion Aliment RN, BSN, CCRP Clinical Research Nurse 03/25/2018 10:50 AM    Adverse Event Log Protocol: Novartis [id number] Cycle: Extension Phase #10(AE date range5/31/19-8/26/19) Ongoing and unchanged in severity:fatigue, hot flashes (mild), memory impairment, dry skin, and back pain  Event Grade Onset Date Resolved Date Attribution to letrozole Attribution to everolimus  Blurred vision, left eye 1 06/28/2016 Ongoing Not suspected Not suspected  Alkaline phosphatase increased 1 08/14/2012 Ongoing Not suspected Suspected  AST increased 1 11/30/2015 03/17/2018 Not suspected Suspected  Creatinine increased 1 12/25/2017 03/17/2018 Not suspected Suspected  Hypercalcemia 1 09/30/17 03/17/2018 Not suspected Not suspected  Hyperglycemia 2 09/30/17 03/17/2018 Not Suspected Suspected   Hypertriglyceridemia 2 09/30/17 03/23/2018 Not Suspected NotSuspected  Hyperglycemia  1 03/17/2018 Ongoing  Not Suspected Suspected  Hypertriglyceridemia  1 03/23/18 Ongoing  Not Suspected Not Suspected

## 2018-03-19 NOTE — Telephone Encounter (Signed)
Gave avs and calendar ° °

## 2018-03-23 DIAGNOSIS — Z5181 Encounter for therapeutic drug level monitoring: Secondary | ICD-10-CM | POA: Diagnosis not present

## 2018-03-23 DIAGNOSIS — E785 Hyperlipidemia, unspecified: Secondary | ICD-10-CM | POA: Diagnosis not present

## 2018-03-23 DIAGNOSIS — E119 Type 2 diabetes mellitus without complications: Secondary | ICD-10-CM | POA: Diagnosis not present

## 2018-03-26 ENCOUNTER — Other Ambulatory Visit: Payer: Self-pay | Admitting: Oncology

## 2018-03-26 NOTE — Progress Notes (Unsigned)
Barbara Parks's repeat lipid panel is improved and specifically the LDL has gone from 1 67-1 59.  We will continue to follow closely as she is on everolimus.

## 2018-03-27 ENCOUNTER — Encounter: Payer: Self-pay | Admitting: *Deleted

## 2018-03-27 NOTE — Progress Notes (Signed)
03/27/18 at 1:59pm - The pt called the research nurse this afternoon with concerns that her PCP, Dr. Inda Merlin, wanted to increase her Crestor dose to further improve her lipid panel values.  The pt stated that she knew that there was an interaction between her Lopid and Crestor, and she was concerned that increasing her Crestor might be problematic for her.  The pt said that Dr. Jana Hakim only prescribed 5 mg of Crestor because he was aware of the interaction between Lopid and Crestor.  The research nurse told the pt that she would discuss this possible Crestor increase with Dr. Jana Hakim.  The research nurse discussed this issue with Dr. Jana Hakim.  He reviewed the pt's lipid panel results again.  He also reviewed the drug interaction again with the 2 drugs.  He said that he wanted the pt to stop her Lopid immediately and to have Dr. Inda Merlin increase her Crestor at her discretion.  The research nurse left a detailed message on the pt's voicemail alerting her to immediately stop her Lopid.  The research nurse said that Dr. Inda Merlin office would need to prescribe the Crestor dose.  The pt was told that if she had any questions/concerns to call the research nurse before 5 pm today.  Then, the research nurse called and spoke to Nadean Corwin, [facility] pharmacist, to ensure that there is no interaction with her study drug (everolimus) and with higher doses of Crestor.  Jaclyn Shaggy confirmed that there are no interactions with Crestor and everolimus.  The research nurse then called and spoke directly with Dr. Moses Manners nurse, Jac Canavan, about Dr. Virgie Dad orders.  Jac Canavan was informed that the pt had been told to stop her Lopid today, and to expect Dr. Inda Merlin to increase her Crestor.  Jac Canavan took the information and relayed the info to Dr. Inda Merlin.  She said that Dr. Inda Merlin would increase her Crestor to 10 mg.  The research nurse gave Jac Canavan her phone number if there were any questions/concerns about the medication  changes.  Dr. Jana Hakim said that he wanted a lipid panel drawn at her next office appointment in November.  The research nurse told Dr. Moses Manners nurse that our office would fax her 06/11/18 lipid panel to Dr. Inda Merlin.  Brion Aliment RN, BSN, CCRP Clinical Research Nurse 03/27/2018 2:15 PM

## 2018-04-09 ENCOUNTER — Ambulatory Visit
Admission: RE | Admit: 2018-04-09 | Discharge: 2018-04-09 | Disposition: A | Discharge status: Home / Self Care | Payer: Medicare Other | Source: Ambulatory Visit | Attending: Adult Health | Admitting: Adult Health

## 2018-04-09 DIAGNOSIS — M8589 Other specified disorders of bone density and structure, multiple sites: Secondary | ICD-10-CM | POA: Diagnosis not present

## 2018-04-09 DIAGNOSIS — Z1239 Encounter for other screening for malignant neoplasm of breast: Secondary | ICD-10-CM

## 2018-04-09 DIAGNOSIS — Z1231 Encounter for screening mammogram for malignant neoplasm of breast: Secondary | ICD-10-CM | POA: Diagnosis not present

## 2018-04-09 DIAGNOSIS — Z780 Asymptomatic menopausal state: Secondary | ICD-10-CM | POA: Diagnosis not present

## 2018-04-09 DIAGNOSIS — E2839 Other primary ovarian failure: Secondary | ICD-10-CM

## 2018-04-09 HISTORY — DX: Personal history of antineoplastic chemotherapy: Z92.21

## 2018-04-16 ENCOUNTER — Telehealth: Payer: Self-pay

## 2018-04-16 NOTE — Telephone Encounter (Signed)
Have attempted to call patient several times with no answer.  LVM for patient to call center when she is available.

## 2018-05-06 ENCOUNTER — Other Ambulatory Visit: Payer: Self-pay | Admitting: Oncology

## 2018-06-09 NOTE — Progress Notes (Signed)
[place]  [phone] [phone]     ID: Barbara ZAVALETA DOB: 1951/03/17  MR#: [id number]  PHX#:[id number]  Patient Care Team: Darcus Austin, MD as PCP - General (Family Medicine) Jerline Pain, MD (Cardiology) Gaye Pollack, MD (Cardiothoracic Surgery) Royal Vandevoort, Virgie Dad, MD as Consulting Physician (Oncology) OTHER MD: Dr Erroll Luna- Merlene Laughter, DDS; Gaynelle Arabian MD  CHIEF COMPLAINT: metastatic breast cancer, on BOLERO study  CURRENT TREATMENT: letrozole + everolimus  BREAST CANCER HISTORY: From Dr. Collier [name]'s original intake note 04/13/2004:  "This woman has been in good health all of her life. She recently moved from [place] to work here.  She palpated a mass at about the 12 o'clock position in mid-July. She has not noticed any nipple retraction or skin changes.  She was seen by her primary care doctor who subsequently referred her for a mammogram.  Mammogram was performed on 03/01/04. This demonstrated a spiculated 2 cm mass at the 12 o'clock position in the right breast.  Upper outer quadrant of the left breast shows some distortion.   Physical exam at that time showed a firm, nontender nodule at the 12 o'clock position in the right breast, 5 cm from the nipple.  Ultrasound of this area showed a hypoechoic ill-defined mass, measuring 2.2 x 1.3 x 1.4 cm.  Physical exam of the left breast showed general vague thickening, upper outer quadrant of the left breast with a discrete palpable mass. The ultrasound performed showed a single hypoechoic ill-defined nodule at the 1 o'clock position, measuring 7 x 5 x 6 mm.  She had biopsies of both lesions on 03/02/03.  Needle core biopsy of the lesion on the right breast revealed invasive mammary carcinoma.  Needle core biopsy of the left breast showed a complex fibroadenoma.  Prognostic panel of the lesion on the right breast showed it to be ER positive at 73%, PR positive at 90% and proliferative index 9%,  HER-2 was 1+.  Patient was referred to Dr. Annamaria Boots, who performed a simple mastectomy with sentinel lymph node evaluation on 03/22/04.  Final pathology showed this to be an invasive ductal carcinoma  with lobular features, measuring 2.2 cm, grade 2 of 3.  Margins negative for carcinoma.  Invasive ductal carcinoma was extended to involve deep dermis of the nipple.  Lymphovascular invasion was identified.  Total of 4 sentinel lymph nodes were evaluated.  Touch imprints at the time of the OR was felt to be negative.  Subsequent evaluation showed a 5 mm focus of metastatic carcinoma in one of the four lymph nodes on microscopic after sectioning.  There was extracapsular extension  of one lymph node as well. The remaining three lymph nodes were all negative."  The patient's subsequent history is detailed below.  INTERVAL HISTORY: Barbara Parks returns today for follow-up and treatment of her metastatic estrogen receptor positive breast cancer. She is accompanied by her research nurse.  She is participating in the [place] trail. The patient continues on letrozole, which she tolerates for the most part. She endorses dry skin and brittle nails.  She also takes everolimus daily. She generally tolerates this well.  Since her last visit here, she underwent a bone density scan on 04/09/2018 showing a t-score of -1.8, which is considered osteopenic.   That same day, 04/09/2018 she underwent a unilateral left breast mammogram, breast density category B, showing no mammographic evidence of malignancy.   Her most recent CT scan of the chest was on 03/17/2018, and was stable.  REVIEW OF SYSTEMS: Barbara Parks is doing well. She has been eating better and has lost weight. She has been eating more protein and vegetables. She also walks 1-2 miles, most days with her dog, depending on her back and neck pain. She takes ibuprofen once a day as needed for pain with relief. Her sugars have been stable. Her vision has been a little blurry,  but she does have cataracts. The patient denies unusual headaches, nausea, vomiting, or dizziness. There has been no unusual cough, phlegm production, or pleurisy. This been no change in bowel or bladder habits. The patient denies unexplained fatigue or unexplained weight loss, bleeding, rash, or fever. A detailed review of systems was otherwise noncontributory.    PAST MEDICAL HISTORY: Past Medical History:  Diagnosis Date  . Arthritis    left hip  . Asthma   . breast ca 2005   breast/chemo R mastectomy  . Dermatitis   . Diabetes mellitus without complication ([place])   . GERD (gastroesophageal reflux disease)   . Hypercholesteremia   . Metastasis to lung ([place]) dx'd 08/2011  . Osteopenia due to cancer therapy 09/09/2013  . Palpitations   . Personal history of chemotherapy   . Shortness of breath     PAST SURGICAL HISTORY: Past Surgical History:  Procedure Laterality Date  . BREAST SURGERY  2005   right  . CATARACT EXTRACTION W/PHACO Left 01/18/2014   Procedure: CATARACT EXTRACTION PHACO AND INTRAOCULAR LENS PLACEMENT (IOC);  Surgeon: Elta Guadeloupe T. Gershon Crane, MD;  Location: [facility];  Service: Ophthalmology;  Laterality: Left;  CDE 15.79  . CATARACT EXTRACTION W/PHACO Right 02/08/2014   Procedure: CATARACT EXTRACTION PHACO AND INTRAOCULAR LENS PLACEMENT (IOC);  Surgeon: Elta Guadeloupe T. Gershon Crane, MD;  Location: [facility];  Service: Ophthalmology;  Laterality: Right;  CDE 4.41  . CHEST TUBE INSERTION  09/11/2011   Procedure: INSERTION PLEURAL DRAINAGE CATHETER;  Surgeon: Gaye Pollack, MD;  Location: [place];  Service: Thoracic;  Laterality: Right;  . MASTECTOMY Right   . PERICARDIAL WINDOW  09/11/2011   Procedure: PERICARDIAL WINDOW;  Surgeon: Gaye Pollack, MD;  Location: [facility];  Service: Thoracic;  Laterality: N/A;  . REMOVAL OF PLEURAL DRAINAGE CATHETER  12/19/2011   Procedure: REMOVAL OF PLEURAL DRAINAGE CATHETER;  Surgeon: Gaye Pollack, MD;  Location: [place];  Service: Thoracic;  Laterality: Right;  TO BE  DONE IN MINOR ROOM, SHORT STAY  . [place]  . TOTAL HIP ARTHROPLASTY Left 06/07/2015   Procedure: LEFT TOTAL HIP ARTHROPLASTY ANTERIOR APPROACH;  Surgeon: Gaynelle Arabian, MD;  Location: [facility];  Service: Orthopedics;  Laterality: Left;  Marland Kitchen VIDEO BRONCHOSCOPY  09/11/2011   Procedure: VIDEO BRONCHOSCOPY;  Surgeon: Gaye Pollack, MD;  Location: [facility];  Service: Thoracic;  Laterality: N/A;    FAMILY HISTORY Family History  Problem Relation Age of Onset  . Cancer Mother        breast  . Heart disease Father   . Diabetes Other   . Anesthesia problems Neg Hx    the patient's mother was diagnosed with breast cancer at age 51, she died age 59 from congestive heart failure..The patient's father died from heart disease at age 12.  She has one sister alive & well.  Two brothers alive & well, one with diabetes. The patient's sister was also diagnosed with breast cancer, and was tested for the BRCA gene, and was negative. The patient herself has not been tested. There is no history of ovarian cancer in the  family  GYNECOLOGIC HISTORY:  No LMP recorded. Patient is postmenopausal. Menarche age 3, the patient is GX P0. She stopped having periods with chemotherapy in 2005. 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 She was a Archivist. She is single, lives alone with her Shitzu-poodle Sammie.. Family is all in the [place] area.  Note her brother is currently under treatment as discussed today    ADVANCED DIRECTIVES: Not in place. At the 02/23/2014 visit the patient was given the appropriate documents to complete and notarize at her discretion. She tells me she is planning to name her sister, Billie Ruddy, as healthcare power of attorney. [name] can be reached at [phone]   HEALTH MAINTENANCE: Social History   Tobacco Use  . Smoking status: Former Smoker    Packs/day:  1.50    Years: 30.00    Pack years: 45.00    Types: Cigarettes    Last attempt to quit: 09/10/2007    Years since quitting: 10.7  . Smokeless tobacco: Never Used  Substance Use Topics  . Alcohol use: No  . Drug use: No     Colonoscopy:  PAP:  Bone density: 04/09/2018, T score -1.8  Lipid panel:  Allergies  Allergen Reactions  . Aspirin     REACTION: upset stomach  . Latex Other (See Comments)    Blistering and skin peels off  . Nsaids Nausea And Vomiting    Extreme nausea and vomiting    Current Outpatient Medications  Medication Sig Dispense Refill  . blood glucose meter kit and supplies KIT Dispense based on patient and insurance preference. Use up to four times daily as directed. (FOR ICD-9 250.00, 250.01). 1 each 0  . cholecalciferol (VITAMIN D) 1000 units tablet Take 1,000 Units by mouth 2 (two) times daily.    Marland Kitchen gemfibrozil (LOPID) 600 MG tablet TAKE 1 TABLET BY MOUTH TWICE DAILY BEFORE MEAL(S) 180 tablet 1  . Investigational everolimus (RAD001) 5 MG tablet Novartis [id number] Take 2 tablets by mouth daily. Take with a glass of water. 168 tablet 0  . letrozole (FEMARA) 2.5 MG tablet Take 1 tablet (2.5 mg total) by mouth daily. 90 tablet 3  . loperamide (IMODIUM) 2 MG capsule Take 2 mg by mouth 4 (four) times daily as needed for diarrhea or loose stools. Reported on 11/30/2015    . metFORMIN (GLUCOPHAGE-XR) 500 MG 24 hr tablet Take 1 tablet (500 mg total) by mouth 3 (three) times daily before meals.  3  . rosuvastatin (CRESTOR) 10 MG tablet Take 1 tablet (10 mg total) by mouth daily. 90 tablet 4   No current facility-administered medications for this visit.     OBJECTIVE: Middle-aged white woman   Vitals:   06/11/18 0953 06/11/18 0954  BP: 107/67 112/62  Pulse: 90 88  Resp:    Temp:    SpO2:       Body mass index is 23.95 kg/m.   Filed Weights   06/11/18 0952  Weight: 139 lb 8 oz (63.3 kg)      ECOG FS:1 - Symptomatic but completely ambulatory  Sclerae  unicteric, pupils round and equal Oropharynx clear,  No cervical or supraclavicular adenopathy Lungs no rales or rhonchi Heart regular rate and rhythm Abd soft, nontender, positive bowel sounds MSK no focal spinal tenderness, no upper extremity lymphedema Neuro: nonfocal, well oriented, appropriate affect Breasts:  Status post right  mastectomy, with no evidence of chest wall recurrence.  Left breast benign; both axillae are benign.  LAB  RESULTS:  CMP     Component Value Date/Time   NA 139 06/11/2018 0851   NA 139 07/08/2017 0942   K 4.1 06/11/2018 0851   K 3.8 07/08/2017 0942   CL 106 06/11/2018 0851   CL 109 (H) 12/31/2012 0940   CO2 22 06/11/2018 0851   CO2 18 (L) 07/08/2017 0942   GLUCOSE 145 (H) 06/11/2018 0851   GLUCOSE 156 (H) 07/08/2017 0942   GLUCOSE 127 (H) 12/31/2012 0940   BUN 17 06/11/2018 0851   BUN 17.7 07/08/2017 0942   CREATININE 1.06 (H) 06/11/2018 0851   CREATININE 1.18 (H) 12/25/2017 0959   CREATININE 1.0 07/08/2017 0942   CALCIUM 10.2 06/11/2018 0851   CALCIUM 10.4 07/08/2017 0942   PROT 8.4 (H) 06/11/2018 0851   PROT 7.7 07/08/2017 0942   ALBUMIN 3.8 06/11/2018 0851   ALBUMIN 3.4 (L) 07/08/2017 0942   AST 23 06/11/2018 0851   AST 50 (H) 12/25/2017 0959   AST 54 (H) 07/08/2017 0942   ALT 19 06/11/2018 0851   ALT 53 12/25/2017 0959   ALT 39 07/08/2017 0942   ALKPHOS 192 (H) 06/11/2018 0851   ALKPHOS 232 (H) 07/08/2017 0942   BILITOT 0.4 06/11/2018 0851   BILITOT 0.3 12/25/2017 0959   BILITOT 0.42 07/08/2017 0942   GFRNONAA 53 (L) 06/11/2018 0851   GFRNONAA 47 (L) 12/25/2017 0959   GFRAA >60 06/11/2018 0851   GFRAA 54 (L) 12/25/2017 0959    I No results found for: SPEP  Lab Results  Component Value Date   WBC 8.0 06/11/2018   NEUTROABS 5.9 06/11/2018   HGB 15.1 (H) 06/11/2018   HCT 46.5 (H) 06/11/2018   MCV 76.9 (L) 06/11/2018   PLT 235 06/11/2018      Chemistry      Component Value Date/Time   NA 139 06/11/2018 0851   NA 139  07/08/2017 0942   K 4.1 06/11/2018 0851   K 3.8 07/08/2017 0942   CL 106 06/11/2018 0851   CL 109 (H) 12/31/2012 0940   CO2 22 06/11/2018 0851   CO2 18 (L) 07/08/2017 0942   BUN 17 06/11/2018 0851   BUN 17.7 07/08/2017 0942   CREATININE 1.06 (H) 06/11/2018 0851   CREATININE 1.18 (H) 12/25/2017 0959   CREATININE 1.0 07/08/2017 0942      Component Value Date/Time   CALCIUM 10.2 06/11/2018 0851   CALCIUM 10.4 07/08/2017 0942   ALKPHOS 192 (H) 06/11/2018 0851   ALKPHOS 232 (H) 07/08/2017 0942   AST 23 06/11/2018 0851   AST 50 (H) 12/25/2017 0959   AST 54 (H) 07/08/2017 0942   ALT 19 06/11/2018 0851   ALT 53 12/25/2017 0959   ALT 39 07/08/2017 0942   BILITOT 0.4 06/11/2018 0851   BILITOT 0.3 12/25/2017 0959   BILITOT 0.42 07/08/2017 0942       Lab Results  Component Value Date   LABCA2 58 (H) 09/14/2012    No components found for: [id number]  No results for input(s): INR in the last 168 hours.  Urinalysis    Component Value Date/Time   COLORURINE YELLOW 11/25/2017 0901   APPEARANCEUR HAZY (A) 11/25/2017 0901   LABSPEC 1.017 11/25/2017 0901   LABSPEC 1.030 07/13/2015 0841   PHURINE 5.0 11/25/2017 0901   GLUCOSEU NEGATIVE 11/25/2017 0901   GLUCOSEU Negative 07/13/2015 0841   HGBUR MODERATE (A) 11/25/2017 0901  BILIRUBINUR NEGATIVE 11/25/2017 0901   BILIRUBINUR Negative 07/13/2015 0841   KETONESUR NEGATIVE 11/25/2017 0901   PROTEINUR 100 (A) 11/25/2017 0901   UROBILINOGEN 0.2 07/13/2015 0841   NITRITE NEGATIVE 11/25/2017 0901   LEUKOCYTESUR NEGATIVE 11/25/2017 0901   LEUKOCYTESUR Negative 07/13/2015 0841    STUDIES: She underwent a bone density scan on 04/09/2018 showing a t-score of -1.8, which is considered osteopenic.   That same day, 04/09/2018 she underwent a unilateral left breast mammogram, breast density category B, showing no mammographic evidence of malignancy.   ASSESSMENT: 67 y.o. Barbara Parks, Barbara Parks woman with stage IV breast cancer, on BOLERO-4  trial  (1) status post right mastectomy and sentinel lymph node sampling 03/22/2004 for a right upper-outer quadrant pT2 pN1, stage IIB invasive ductal carcinoma with lobular features, grade 2, estrogen receptor and progesterone receptor positive, HER-2 negative, with an MIB-1 of 9% ([id number] and [id number])  (2) addition all right axillary lymph node sampling 05/07/2004 showed 2 benign lymph nodes (4 lymph nodes previously removed, so total was one positive lymph node out of 6; [id number])  (3) the patient was evaluated by radiation oncology; no postmastectomy radiation recommended  (4) adjuvant chemotherapy with dose dense doxorubicin and cyclophosphamide x4 cycles (first cycle delayed one week) followed by dose dense paclitaxel x4 was completed 09/18/2004  (5) tamoxifen started March 2006, discontinued 2009  METASTATIC DISEASE: February 2013 (6) presenting with a large pericardial effusion, large right pleural effusion and possible right middle lobe bronchial obstruction February 2013, status post pericardial window placement, fiberoptic bronchoscopy and right Pleurx placement 09/11/2011, with biopsy of the bronchus intermedius and pericardium positive for metastatic breast cancer, estrogen receptor 91% positive with moderate staining intensity, progesterone receptor 100% positive with strong staining intensity, with an MIB-1 of 35%, and no HER-2 amplification, the signals ratio being 1.37 ([id number])  (7) enrolled in BOLERO-4 trial 10/10/2011, receiving letrozole and everolimus  (a) two small areas of enhancement in the cerebellum noted by brain MRI 10/03/2011 were no longer apparent on repeat MRI 08/21/2012-- most recent brain MRI 04/01/2014 showed no evidence of intracranial metastatic disease  (b) sclerotic lesions in left iliac bone and sacrum have not been biopsied; stable; plan is to start zolendronate after patient updates her dental care (extraction planned)  (c) RLL lung nodule, stable  (rescanned 07/12/2014 and 08/11/2014) R hilar and subcarinal lymph nodes: stable  (d) most recent CT of the chest:03/25/2018, stable  (8) additional problems:  (a) hepatic steatosis  (b) COPD/ emphysema/ asthma  (c) advanced L hip osteoarthritis, status post left total hip replacement 06/07/2015  (d) aortoiliac atherosclerosis  (e) dental evaluation pending w possible dental extractions  (f) likely thalassemia trait  (g) hyperlipidemia  (9) Bone density concerns: DEXA scan 08/11/2013 was normal  (a) repeat DEXA scan 04/09/2018 shows a T score of -1.8, osteopenia   PLAN:  Evanne will be 7 years out from definitive diagnosis of metastatic breast cancer at the next visit, with no clinical evidence of disease activity.  This is very favorable.  She is tolerating the letrozole and everolimus well and the plan will be to continue those indefinitely until there is evidence of disease progression  We are following her lipids closely and we had a lipid panel drawn today.  I anticipate good results.  If so we will repeat it a year from now.  She does have some osteopenia.  She has not started her winter vitamin D supplementation but I urged her to go ahead and do it and  continue for the whole year.  She already has an excellent walking program.  She is doing a fantastic job with her diet and exercise.  I anticipate her hemoglobin A1c will continue to improve  She will return to see me in February.  She knows to call for any problems that may develop before that visit.   Medical Oncology and Hematology [facility] [address] [place], [place] [address] Tel. [phone]    Fax. [phone]  I, Margit Banda am acting as a scribe for Chauncey Cruel, MD.   I, Lurline Del MD, have reviewed the above documentation for accuracy and completeness, and I agree with the above.

## 2018-06-11 ENCOUNTER — Encounter: Payer: Self-pay | Admitting: *Deleted

## 2018-06-11 ENCOUNTER — Inpatient Hospital Stay: Payer: Medicare Other | Attending: Oncology

## 2018-06-11 ENCOUNTER — Inpatient Hospital Stay (HOSPITAL_BASED_OUTPATIENT_CLINIC_OR_DEPARTMENT_OTHER): Payer: Medicare Other | Admitting: Oncology

## 2018-06-11 ENCOUNTER — Telehealth: Payer: Self-pay | Admitting: Oncology

## 2018-06-11 VITALS — BP 112/62 | HR 88 | Temp 97.9°F | Resp 18 | Ht 64.0 in | Wt 139.5 lb

## 2018-06-11 DIAGNOSIS — Z9221 Personal history of antineoplastic chemotherapy: Secondary | ICD-10-CM

## 2018-06-11 DIAGNOSIS — I313 Pericardial effusion (noninflammatory): Secondary | ICD-10-CM

## 2018-06-11 DIAGNOSIS — Z006 Encounter for examination for normal comparison and control in clinical research program: Secondary | ICD-10-CM | POA: Diagnosis not present

## 2018-06-11 DIAGNOSIS — C7800 Secondary malignant neoplasm of unspecified lung: Secondary | ICD-10-CM

## 2018-06-11 DIAGNOSIS — K760 Fatty (change of) liver, not elsewhere classified: Secondary | ICD-10-CM | POA: Diagnosis not present

## 2018-06-11 DIAGNOSIS — Z9011 Acquired absence of right breast and nipple: Secondary | ICD-10-CM | POA: Diagnosis not present

## 2018-06-11 DIAGNOSIS — C801 Malignant (primary) neoplasm, unspecified: Secondary | ICD-10-CM

## 2018-06-11 DIAGNOSIS — Z79811 Long term (current) use of aromatase inhibitors: Secondary | ICD-10-CM

## 2018-06-11 DIAGNOSIS — C7801 Secondary malignant neoplasm of right lung: Secondary | ICD-10-CM | POA: Insufficient documentation

## 2018-06-11 DIAGNOSIS — I3131 Malignant pericardial effusion in diseases classified elsewhere: Secondary | ICD-10-CM

## 2018-06-11 DIAGNOSIS — E782 Mixed hyperlipidemia: Secondary | ICD-10-CM

## 2018-06-11 DIAGNOSIS — Z170 Estrogen receptor positive status [ER+]: Secondary | ICD-10-CM | POA: Diagnosis not present

## 2018-06-11 DIAGNOSIS — Z79899 Other long term (current) drug therapy: Secondary | ICD-10-CM | POA: Diagnosis not present

## 2018-06-11 DIAGNOSIS — C50911 Malignant neoplasm of unspecified site of right female breast: Secondary | ICD-10-CM

## 2018-06-11 DIAGNOSIS — C50411 Malignant neoplasm of upper-outer quadrant of right female breast: Secondary | ICD-10-CM | POA: Diagnosis not present

## 2018-06-11 DIAGNOSIS — M8580 Other specified disorders of bone density and structure, unspecified site: Secondary | ICD-10-CM | POA: Diagnosis not present

## 2018-06-11 LAB — LIPID PANEL
CHOL/HDL RATIO: 6.8 ratio
CHOLESTEROL: 314 mg/dL — AB (ref 0–200)
HDL: 46 mg/dL (ref >40)
LDL Cholesterol: UNDETERMINED mg/dL (ref 0–99)
TRIGLYCERIDES: 660 mg/dL — AB (ref <150)
VLDL: UNDETERMINED mg/dL (ref 0–40)

## 2018-06-11 LAB — CBC WITH DIFFERENTIAL/PLATELET
Abs Immature Granulocytes: 0.03 10*3/uL (ref 0.00–0.07)
Basophils Absolute: 0.1 10*3/uL (ref 0.0–0.1)
Basophils Relative: 1 %
Eosinophils Absolute: 0.1 10*3/uL (ref 0.0–0.5)
Eosinophils Relative: 1 %
HEMATOCRIT: 46.5 % — AB (ref 36.0–46.0)
HEMOGLOBIN: 15.1 g/dL — AB (ref 12.0–15.0)
Immature Granulocytes: 0 %
LYMPHS ABS: 1.3 10*3/uL (ref 0.7–4.0)
LYMPHS PCT: 17 %
MCH: 25 pg — AB (ref 26.0–34.0)
MCHC: 32.5 g/dL (ref 30.0–36.0)
MCV: 76.9 fL — ABNORMAL LOW (ref 80.0–100.0)
MONO ABS: 0.7 10*3/uL (ref 0.1–1.0)
Monocytes Relative: 8 %
Neutro Abs: 5.9 10*3/uL (ref 1.7–7.7)
Neutrophils Relative %: 73 %
Platelets: 235 10*3/uL (ref 150–400)
RBC: 6.05 MIL/uL — AB (ref 3.87–5.11)
RDW: 14.2 % (ref 11.5–15.5)
WBC: 8 10*3/uL (ref 4.0–10.5)
nRBC: 0 % (ref 0.0–0.2)

## 2018-06-11 LAB — COMPREHENSIVE METABOLIC PANEL
ALK PHOS: 192 U/L — AB (ref 38–126)
ALT: 19 U/L (ref 0–44)
AST: 23 U/L (ref 15–41)
Albumin: 3.8 g/dL (ref 3.5–5.0)
Anion gap: 11 (ref 5–15)
BUN: 17 mg/dL (ref 8–23)
CALCIUM: 10.2 mg/dL (ref 8.9–10.3)
CO2: 22 mmol/L (ref 22–32)
CREATININE: 1.06 mg/dL — AB (ref 0.44–1.00)
Chloride: 106 mmol/L (ref 98–111)
GFR calc non Af Amer: 53 mL/min — ABNORMAL LOW (ref >60)
GLUCOSE: 145 mg/dL — AB (ref 70–99)
Potassium: 4.1 mmol/L (ref 3.5–5.1)
SODIUM: 139 mmol/L (ref 135–145)
Total Bilirubin: 0.4 mg/dL (ref 0.3–1.2)
Total Protein: 8.4 g/dL — ABNORMAL HIGH (ref 6.5–8.1)

## 2018-06-11 MED ORDER — LETROZOLE 2.5 MG PO TABS: 2.5000 mg | ORAL_TABLET | Freq: Every day | ORAL | 3 refills | Status: DC

## 2018-06-11 MED ORDER — ROSUVASTATIN CALCIUM 10 MG PO TABS: 10.0000 mg | ORAL_TABLET | Freq: Every day | ORAL | 4 refills | Status: DC

## 2018-06-11 MED ORDER — INV-EVEROLIMUS (RAD0001) 5MG TABLET NOVARTIS CRAD001Y24135: 2.0000 | ORAL_TABLET | Freq: Every day | ORAL | 0 refills | Status: DC

## 2018-06-11 MED ORDER — LETROZOLE 2.5 MG PO TABS: 2.5000 mg | ORAL_TABLET | Freq: Every day | ORAL | 0 refills | Status: DC

## 2018-06-11 NOTE — Telephone Encounter (Signed)
Printed calendar and avs. °

## 2018-06-11 NOTE — Progress Notes (Signed)
06/11/18 at 11:11am- [employer] 4 extension phase #12 study notes Thepatientwasintotheclinicthismorningunaccompanied forherroutine 12-week follow-up visit and study drug dispensing. Patient returned 6 kits of everolimus with0tablets returned. The pt's overall 12 week study drug compliance is 100%for the date range of8/22/2019- 06/10/2018. Patient returned paper calendarsdocumenting daily doses of letrozole 2.5 mg and everolimus 10 mg with no missed doses.Medication boxes were returned to thepharmacy for drug accountability by Joretta Bachelor.The pt said that she is feeling well today.She reports some mild, nail changes (brittle nails) that Dr. Jana Hakim feels is related to the pt's letrozole, not her study drug, everolimus.  The pt's bone density revealed ongoing osteopenia (first reported on 08/14/15).  The pt reports walking 2 miles on most days.  The pt said that she will start taking Vitamin D again for her bone health.  Patient reports the previously noted AE's (fatigue,hot flashes, dry skin, back pain, and memory impairment)as ongoingand unchanged in severity.  Patient reports her blurry vision in her left eye is still ongoing.She has not yet scheduled an appointment with her ophthalmologist for additional follow up.The pt has had intentional, ongoing weight loss because she is managing her diabetes and eating fewer carbs and more vegetables.  Based on lab results review and history and physical byDr. Jana Hakim, thepatient'scondition is acceptable for continued treatment. Dr. Jana Hakim stated the pt's hematological lab abnormalities were "not clinically significant".  Dr. Jana Hakim examined the pt.  He feels that she has no evidence of disease recurrence.  In hisopinion,she is still receiving benefit from her study treatment.Dr. Jana Hakim said that he will repeat her CT chest prior to her next appt in February 2020.  Six kits of study drug everolimus were  dispensed today for the next 12 weeks supply. In addition, patient will continue commercial supply of letrozole 2.5mg  daily. The next study visit is expected to occur on2/12/2018. The research nurse thankedthepatient for hercontinuedparticipationin the study. Brion Aliment RN, BSN, [place] Clinical Research Nurse 06/11/2018 11:28 AM   06/11/18 at 5:03pm- The pt's lipid panel came back this afternoon, and the pt's triglycerides were increased to a grade 3 toxicity.  According to the protocol, table 6-10- is was recommended that the pt's study drug be "temporary dose interruption and re-initiate everolimus at a lower dose".  Dr. Jana Hakim reviewed this recommendation, and he stated that he wanted to simply treat/manage her triglycerides and avoid interrupting her study drug.  The pt in his opinion is doing well with her treatment.  Dr. Lindi Adie, the study PI, also talked with Dr. Jana Hakim about this pt's dyslipidemia.  The site also received an email from Danley Danker at [employer] which stated that "the investigator needs to apply his/her medical criterion regarding the best choice for the patient".  Therefore, it was decided that the pt's Crestor would be increased and the MD would add an additional medication to hopefully manage the pt's triglycerides more effectively.   Brion Aliment RN, BSN, CCR[employer] Clinical Research Nurse 06/11/2018 5:13 PM   06/12/18 at 11:27am- The research nurse called the pt and informed her regarding her lipid panel.  The pt was told that the protocol recommends "interrupting" her study drug due to her grade 3 hypertriglyceridemia.  The pt was told that Dr. Jana Hakim and Dr. Lindi Adie, study PI, agreed that it was in the pt's best interest to remain on her study treatment.  The pt was relieved to know that she will continue on her everolimus.  The pt was told that Dr. Jana Hakim wants to double her  Crestor and add a new medication, fenofibrate.  The pt said that she wants to go back on  her Lopid which was stopped in August and go back to 5 mg Crestor.  The pt was transferred to Dr. Virgie Dad nurse for further discussion of her medications.   Brion Aliment RN, BSN, CCRP Clinical Research Nurse 06/12/2018 11:38 AM   Adverse Event Log Protocol: [employer] [id number] Cycle: Extension Phase #11(AE date range8/19-11/14/19) Ongoing and unchanged in severity:fatigue, hot flashes (mild), memory impairment, dry skin,  blurred vision, back pain and osteopenia "Not clinically significant" abnormal labs not required by the study to be reported as AE's are the following: alkaline phosphatase, creatinine increased, proteinuria (urine) Event Grade Onset Date Resolved Date Attribution to letrozole # Attribution to everolimus #  Hyperglycemia  1 03/17/18 Ongoing  No-Not Suspected Yes-Suspected  Hypertriglyceridemia  1 03/23/18 06/11/18 No-Not Suspected Yes-Suspected  Hypertriglyceridemia* 3 06/11/18 Ongoing No-Not Suspected Yes-Suspected  Cholesterol High * 2  06/11/18 Ongoing Yes-Suspected  Yes-Suspected  Nail changes (brittle) 1 06/11/18 Ongoing Yes-Suspected No-Not Suspected  Weight Loss  3 06/11/18 Ongoing No-Not Suspected No-Not Suspected  * denotes "Clinically Significant" abnormal labs confirmed by Dr. Jana Hakim  # attributions related to letrozole and everolimus were confirmed by Dr. Jana Hakim on 06/16/18.   Brion Aliment RN, BSN, CCRP  Clinical Research Nurse 06/16/2018 3:54 PM

## 2018-06-12 ENCOUNTER — Telehealth: Payer: Self-pay

## 2018-06-12 LAB — CANCER ANTIGEN 27.29: CA 27.29: 106.2 U/mL — ABNORMAL HIGH (ref 0.0–38.6)

## 2018-06-12 NOTE — Telephone Encounter (Signed)
Nurse called patient to inform her of Dr. Virgie Dad recommendations of Fenofibrate and Crestor d/t high triglyceride lab values.  Patient requested that she would like Dr. Jana Hakim to consider her previous regiment of Lopid 600mg  daily BID and Crestor 5mg  daily.    Nurse reviewed with Dr. Doris Cheadle.  Dr. Jana Hakim okay for Lopid 600mg  BID, however would like Crestor 10 mg tab daily.  Re-check in 3 months.   Patient informed, voiced concerned with muscle pains with Crestor 10mg .  Nurse educated patient if muscle pain or weakness develop to call office and we can dose reduce per Dr. Jana Hakim.  Patient voiced understanding and has no further needs at this time.  Patient will RX's available at home.  Will call if refills are needed.

## 2018-06-17 DIAGNOSIS — I700 Atherosclerosis of aorta: Secondary | ICD-10-CM | POA: Diagnosis not present

## 2018-06-17 DIAGNOSIS — H9193 Unspecified hearing loss, bilateral: Secondary | ICD-10-CM | POA: Diagnosis not present

## 2018-06-17 DIAGNOSIS — E1165 Type 2 diabetes mellitus with hyperglycemia: Secondary | ICD-10-CM | POA: Diagnosis not present

## 2018-06-17 DIAGNOSIS — C7951 Secondary malignant neoplasm of bone: Secondary | ICD-10-CM | POA: Diagnosis not present

## 2018-06-17 DIAGNOSIS — E785 Hyperlipidemia, unspecified: Secondary | ICD-10-CM | POA: Diagnosis not present

## 2018-08-03 ENCOUNTER — Other Ambulatory Visit: Payer: Self-pay | Admitting: *Deleted

## 2018-08-03 MED ORDER — GEMFIBROZIL 600 MG PO TABS: 600.0000 mg | ORAL_TABLET | Freq: Two times a day (BID) | ORAL | 1 refills | Status: DC

## 2018-08-29 ENCOUNTER — Other Ambulatory Visit: Payer: Self-pay | Admitting: Oncology

## 2018-08-31 ENCOUNTER — Encounter (HOSPITAL_COMMUNITY): Payer: Self-pay

## 2018-08-31 ENCOUNTER — Inpatient Hospital Stay: Payer: Medicare Other | Attending: Oncology

## 2018-08-31 ENCOUNTER — Ambulatory Visit (HOSPITAL_COMMUNITY)
Admission: RE | Admit: 2018-08-31 | Discharge: 2018-08-31 | Disposition: A | Discharge status: Home / Self Care | Payer: Medicare Other | Source: Ambulatory Visit | Attending: Oncology | Admitting: Oncology

## 2018-08-31 DIAGNOSIS — Z006 Encounter for examination for normal comparison and control in clinical research program: Secondary | ICD-10-CM | POA: Diagnosis not present

## 2018-08-31 DIAGNOSIS — Z9011 Acquired absence of right breast and nipple: Secondary | ICD-10-CM | POA: Diagnosis not present

## 2018-08-31 DIAGNOSIS — Z170 Estrogen receptor positive status [ER+]: Secondary | ICD-10-CM

## 2018-08-31 DIAGNOSIS — C50911 Malignant neoplasm of unspecified site of right female breast: Secondary | ICD-10-CM | POA: Insufficient documentation

## 2018-08-31 DIAGNOSIS — M8580 Other specified disorders of bone density and structure, unspecified site: Secondary | ICD-10-CM

## 2018-08-31 DIAGNOSIS — E785 Hyperlipidemia, unspecified: Secondary | ICD-10-CM | POA: Diagnosis not present

## 2018-08-31 DIAGNOSIS — C7800 Secondary malignant neoplasm of unspecified lung: Secondary | ICD-10-CM | POA: Diagnosis not present

## 2018-08-31 DIAGNOSIS — C50411 Malignant neoplasm of upper-outer quadrant of right female breast: Secondary | ICD-10-CM | POA: Diagnosis not present

## 2018-08-31 DIAGNOSIS — Z79899 Other long term (current) drug therapy: Secondary | ICD-10-CM | POA: Insufficient documentation

## 2018-08-31 DIAGNOSIS — E782 Mixed hyperlipidemia: Secondary | ICD-10-CM

## 2018-08-31 DIAGNOSIS — M1612 Unilateral primary osteoarthritis, left hip: Secondary | ICD-10-CM | POA: Diagnosis not present

## 2018-08-31 DIAGNOSIS — C801 Malignant (primary) neoplasm, unspecified: Secondary | ICD-10-CM | POA: Insufficient documentation

## 2018-08-31 DIAGNOSIS — Z9221 Personal history of antineoplastic chemotherapy: Secondary | ICD-10-CM | POA: Insufficient documentation

## 2018-08-31 DIAGNOSIS — Z96642 Presence of left artificial hip joint: Secondary | ICD-10-CM | POA: Diagnosis not present

## 2018-08-31 DIAGNOSIS — C7801 Secondary malignant neoplasm of right lung: Secondary | ICD-10-CM | POA: Diagnosis not present

## 2018-08-31 DIAGNOSIS — M899 Disorder of bone, unspecified: Secondary | ICD-10-CM | POA: Insufficient documentation

## 2018-08-31 DIAGNOSIS — I3131 Malignant pericardial effusion in diseases classified elsewhere: Secondary | ICD-10-CM

## 2018-08-31 DIAGNOSIS — I313 Pericardial effusion (noninflammatory): Secondary | ICD-10-CM | POA: Insufficient documentation

## 2018-08-31 DIAGNOSIS — Z79811 Long term (current) use of aromatase inhibitors: Secondary | ICD-10-CM | POA: Diagnosis not present

## 2018-08-31 LAB — CBC WITH DIFFERENTIAL/PLATELET
Abs Immature Granulocytes: 0.03 10*3/uL (ref 0.00–0.07)
Basophils Absolute: 0.1 10*3/uL (ref 0.0–0.1)
Basophils Relative: 1 %
EOS ABS: 0.1 10*3/uL (ref 0.0–0.5)
Eosinophils Relative: 1 %
HCT: 43.9 % (ref 36.0–46.0)
Hemoglobin: 14.3 g/dL (ref 12.0–15.0)
Immature Granulocytes: 0 %
Lymphocytes Relative: 17 %
Lymphs Abs: 1.2 10*3/uL (ref 0.7–4.0)
MCH: 25.5 pg — ABNORMAL LOW (ref 26.0–34.0)
MCHC: 32.6 g/dL (ref 30.0–36.0)
MCV: 78.4 fL — ABNORMAL LOW (ref 80.0–100.0)
Monocytes Absolute: 0.7 10*3/uL (ref 0.1–1.0)
Monocytes Relative: 10 %
Neutro Abs: 4.8 10*3/uL (ref 1.7–7.7)
Neutrophils Relative %: 71 %
Platelets: 227 10*3/uL (ref 150–400)
RBC: 5.6 MIL/uL — ABNORMAL HIGH (ref 3.87–5.11)
RDW: 15.5 % (ref 11.5–15.5)
WBC: 6.8 10*3/uL (ref 4.0–10.5)
nRBC: 0 % (ref 0.0–0.2)

## 2018-08-31 LAB — COMPREHENSIVE METABOLIC PANEL
ALT: 19 U/L (ref 0–44)
ANION GAP: 10 (ref 5–15)
AST: 28 U/L (ref 15–41)
Albumin: 3.8 g/dL (ref 3.5–5.0)
Alkaline Phosphatase: 179 U/L — ABNORMAL HIGH (ref 38–126)
BUN: 25 mg/dL — ABNORMAL HIGH (ref 8–23)
CO2: 20 mmol/L — ABNORMAL LOW (ref 22–32)
Calcium: 10.3 mg/dL (ref 8.9–10.3)
Chloride: 108 mmol/L (ref 98–111)
Creatinine, Ser: 1.02 mg/dL — ABNORMAL HIGH (ref 0.44–1.00)
GFR calc Af Amer: >60 mL/min (ref >60)
GFR calc non Af Amer: 57 mL/min — ABNORMAL LOW (ref >60)
Glucose, Bld: 108 mg/dL — ABNORMAL HIGH (ref 70–99)
POTASSIUM: 4.1 mmol/L (ref 3.5–5.1)
Sodium: 138 mmol/L (ref 135–145)
Total Bilirubin: 0.3 mg/dL (ref 0.3–1.2)
Total Protein: 8.1 g/dL (ref 6.5–8.1)

## 2018-08-31 LAB — LIPID PANEL
Cholesterol: 262 mg/dL — ABNORMAL HIGH (ref 0–200)
HDL: 55 mg/dL (ref >40)
LDL Cholesterol: 162 mg/dL — ABNORMAL HIGH (ref 0–99)
Total CHOL/HDL Ratio: 4.8 RATIO
Triglycerides: 224 mg/dL — ABNORMAL HIGH (ref <150)
VLDL: 45 mg/dL — ABNORMAL HIGH (ref 0–40)

## 2018-08-31 MED ORDER — IOHEXOL 300 MG/ML  SOLN
75.0000 mL | Freq: Once | INTRAMUSCULAR | Status: AC | PRN
  Administered 2018-08-31: 75 mL via INTRAVENOUS

## 2018-08-31 MED ORDER — SODIUM CHLORIDE (PF) 0.9 % IJ SOLN
INTRAMUSCULAR | Status: AC
  Filled 2018-08-31: qty 50

## 2018-09-01 LAB — CANCER ANTIGEN 27.29: CA 27.29: 126.4 U/mL — ABNORMAL HIGH (ref 0.0–38.6)

## 2018-09-01 LAB — VITAMIN D 25 HYDROXY (VIT D DEFICIENCY, FRACTURES): Vit D, 25-Hydroxy: 9.2 ng/mL — ABNORMAL LOW (ref 30.0–100.0)

## 2018-09-02 NOTE — Progress Notes (Signed)
[facility]  [phone] Fax:(336) [phone]    ID: Barbara Parks DOB: 1950/10/17  MR#: [id number]  ESP#:[id number]  Patient Care Team: Barbara Austin, MD (Inactive) as PCP - General (Family Medicine) Barbara Pain, MD (Cardiology) Barbara Pollack, MD (Cardiothoracic Surgery) Magrinat, Virgie Dad, MD as Consulting Physician (Oncology) OTHER MD: Dr Barbara Parks, DDS; Barbara Arabian MD   CHIEF COMPLAINT: metastatic breast cancer, on BOLERO study  CURRENT TREATMENT: letrozole + everolimus   BREAST CANCER HISTORY: From Dr. Collier Salina Parks's original intake note 04/13/2004:  "This woman has been in good health all of her life. She recently moved from [place] to work here.  She palpated a mass at about the 12 o'clock position in mid-July. She has not noticed any nipple retraction or skin changes.  She was seen by her primary care doctor who subsequently referred her for a mammogram.  Mammogram was performed on 03/01/04. This demonstrated a spiculated 2 cm mass at the 12 o'clock position in the right breast.  Upper outer quadrant of the left breast shows some distortion.   Physical exam at that time showed a firm, nontender nodule at the 12 o'clock position in the right breast, 5 cm from the nipple.  Ultrasound of this area showed a hypoechoic ill-defined mass, measuring 2.2 x 1.3 x 1.4 cm.  Physical exam of the left breast showed general vague thickening, upper outer quadrant of the left breast with a discrete palpable mass. The ultrasound performed showed a single hypoechoic ill-defined nodule at the 1 o'clock position, measuring 7 x 5 x 6 mm.  She had biopsies of both lesions on 03/02/03.  Needle core biopsy of the lesion on the right breast revealed invasive mammary carcinoma.  Needle core biopsy of the left breast showed a complex fibroadenoma.  Prognostic panel of the lesion on the right breast showed it to be ER positive at 73%, PR positive at 90% and proliferative  index 9%, HER-2 was 1+.  Patient was referred to Dr. Annamaria Parks, who performed a simple mastectomy with sentinel lymph node evaluation on 03/22/04.  Final pathology showed this to be an invasive ductal carcinoma  with lobular features, measuring 2.2 cm, grade 2 of 3.  Margins negative for carcinoma.  Invasive ductal carcinoma was extended to involve deep dermis of the nipple.  Lymphovascular invasion was identified.  Total of 4 sentinel lymph nodes were evaluated.  Touch imprints at the time of the OR was felt to be negative.  Subsequent evaluation showed a 5 mm focus of metastatic carcinoma in one of the four lymph nodes on microscopic after sectioning.  There was extracapsular extension  of one lymph node as well. The remaining three lymph nodes were all negative."  The patient's subsequent history is detailed below.   INTERVAL HISTORY: Barbara Parks returns today for follow-up and treatment of her metastatic estrogen receptor positive breast cancer.  She continues on letrozole. She tolerates this well and without any noticeable side effects.    She also continues on everolimus. She tolerates this well and without any noticeable side effects.  She did have a cold in January, after her trip with family, and that has largely cleared.  She has a rare cough at present.  There is no pleurisy.  Barbara Parks's last bone density screening on 08/14/2015, showed a T-score of -1.6, which is considered osteopenic.   Since her last visit here, she underwent a chest CT with contrast on 08/31/2018 showing enlarging right lower lobe pulmonary nodule  which currently measures 1.0 x 1.8 cm (previously 0.7 x 1.3 cm). Other previously noted pulmonary nodules and additional findings are stable compared to the prior examination. Aortic atherosclerosis. Stable 1.4 cm left adrenal nodule, not characterized on today's examination, but statistically likely an adrenal adenoma. Aortic Atherosclerosis (ICD10-I70.0).  Results for RETIA, CORDLE I  (MRN [id number]) as of 09/03/2018 13:00  Ref. Range 09/30/2017 09:49 12/25/2017 09:59 03/17/2018 10:41 06/11/2018 08:51 08/31/2018 11:23  CA 27.29 Latest Ref Range: 0.0 - 38.6 U/mL 62.8 (H) 74.0 (H) 79.2 (H) 106.2 (H) 126.4 (H)     REVIEW OF SYSTEMS: Barbara Parks states that she has lost some weight since discontinuing metformin. She has also changed her diet; she cut her carbohydrates, and increased protein and fiber intake. She is walking a lot, usually between a mile and two miles depending on the weather, and doing some light weights. The patient denies unusual headaches, visual changes, nausea, vomiting, or dizziness. There has been no unusual cough, phlegm production, or pleurisy. This been no change in bowel or bladder habits. The patient denies unexplained fatigue or unexplained weight loss, bleeding, rash, or fever. A detailed review of systems was otherwise noncontributory.    PAST MEDICAL HISTORY: Past Medical History:  Diagnosis Date  . Arthritis    left hip  . Asthma   . breast ca 2005   breast/chemo R mastectomy  . Dermatitis   . Diabetes mellitus without complication (Barbara Parks)   . GERD (gastroesophageal reflux disease)   . Hypercholesteremia   . Metastasis to lung (Barbara Parks) dx'd 08/2011  . Osteopenia due to cancer therapy 09/09/2013  . Palpitations   . Personal history of chemotherapy   . Shortness of breath     PAST SURGICAL HISTORY: Past Surgical History:  Procedure Laterality Date  . BREAST SURGERY  2005   right  . CATARACT EXTRACTION W/PHACO Left 01/18/2014   Procedure: CATARACT EXTRACTION PHACO AND INTRAOCULAR LENS PLACEMENT (IOC);  Surgeon: Barbara Guadeloupe T. Gershon Crane, MD;  Location: [facility];  Service: Ophthalmology;  Laterality: Left;  CDE 15.79  . CATARACT EXTRACTION W/PHACO Right 02/08/2014   Procedure: CATARACT EXTRACTION PHACO AND INTRAOCULAR LENS PLACEMENT (IOC);  Surgeon: Barbara Guadeloupe T. Gershon Crane, MD;  Location: [facility];  Service: Ophthalmology;  Laterality: Right;  CDE 4.41  . CHEST TUBE INSERTION   09/11/2011   Procedure: INSERTION PLEURAL DRAINAGE CATHETER;  Surgeon: Barbara Pollack, MD;  Location: [facility];  Service: Thoracic;  Laterality: Right;  . MASTECTOMY Right   . PERICARDIAL WINDOW  09/11/2011   Procedure: PERICARDIAL WINDOW;  Surgeon: Barbara Pollack, MD;  Location[facility];  Service: Thoracic;  Laterality: N/A;  . REMOVAL OF PLEURAL DRAINAGE CATHETER  12/19/2011   Procedure: REMOVAL OF PLEURAL DRAINAGE CATHETER;  Surgeon: Barbara Pollack, MD;  Location: [facility];  Service: Thoracic;  Laterality: Right;  TO BE DONE IN MINOR ROOM, SHORT STAY  . [place]  . TOTAL HIP ARTHROPLASTY Left 06/07/2015   Procedure: LEFT TOTAL HIP ARTHROPLASTY ANTERIOR APPROACH;  Surgeon: Barbara Arabian, MD;  Location: [facility];  Service: Orthopedics;  Laterality: Left;  Marland Kitchen VIDEO BRONCHOSCOPY  09/11/2011   Procedure: VIDEO BRONCHOSCOPY;  Surgeon: Barbara Pollack, MD;  Location[facility] OR;  Service: Thoracic;  Laterality: N/A;    FAMILY HISTORY Family History  Problem Relation Age of Onset  . Cancer Mother        breast  . Heart disease Father   . Diabetes Other   . Anesthesia problems Neg Hx  The patient's mother was diagnosed with breast cancer at age 19, she died age 80 from congestive heart failure..The patient's father died from heart disease at age 13.  She has one sister alive & well.  Two brothers alive & well, one with diabetes. The patient's sister was also diagnosed with breast cancer, and was tested for the BRCA gene, and was negative. The patient herself has not been tested. There is no history of ovarian cancer in the family   GYNECOLOGIC HISTORY:  No LMP recorded. Patient is postmenopausal. Menarche age 43, the patient is GX P0. She stopped having periods with chemotherapy in 2005. 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 She was a Archivist. She is single, lives alone with her  Shitzu-poodle Sammie.. Family is all in the [place] area.  Note her brother is currently under treatment as discussed today   ADVANCED DIRECTIVES: Not in place. At the 02/23/2014 visit the patient was given the appropriate documents to complete and notarize at her discretion. She tells me she is planning to name her sister, Billie Ruddy, as healthcare power of attorney. [name] can be reached at [phone]   HEALTH MAINTENANCE: Social History   Tobacco Use  . Smoking status: Former Smoker    Packs/day: 1.50    Years: 30.00    Pack years: 45.00    Types: Cigarettes    Last attempt to quit: 09/10/2007    Years since quitting: 10.9  . Smokeless tobacco: Never Used  Substance Use Topics  . Alcohol use: No  . Drug use: No    Colonoscopy:  PAP:  Bone density: 04/09/2018, T score -1.8  Lipid panel:  Allergies  Allergen Reactions  . Aspirin     REACTION: upset stomach  . Latex Other (See Comments)    Blistering and skin peels off  . Nsaids Nausea And Vomiting    Extreme nausea and vomiting    Current Outpatient Medications  Medication Sig Dispense Refill  . blood glucose meter kit and supplies KIT Dispense based on patient and insurance preference. Use up to four times daily as directed. (FOR ICD-9 250.00, 250.01). 1 each 0  . cholecalciferol (VITAMIN D3) 25 MCG (1000 UT) tablet Take 2 tablets (2,000 Units total) by mouth daily. 180 tablet 4  . gemfibrozil (LOPID) 600 MG tablet Take 1 tablet (600 mg total) by mouth 2 (two) times daily before a meal. 180 tablet 1  . Investigational everolimus (RAD001) 5 MG tablet Novartis [id number] Take 2 tablets by mouth daily. Take with a glass of water. 168 tablet 0  . letrozole (FEMARA) 2.5 MG tablet Take 1 tablet (2.5 mg total) by mouth daily. 90 tablet 3  . loperamide (IMODIUM) 2 MG capsule Take 2 mg by mouth 4 (four) times daily as needed for diarrhea or loose stools. Reported on 11/30/2015    . metFORMIN (GLUCOPHAGE-XR) 500 MG 24 hr  tablet Take 1 tablet (500 mg total) by mouth 3 (three) times daily before meals.  3  . rosuvastatin (CRESTOR) 5 MG tablet Take 1 tablet (5 mg total) by mouth daily. 90 tablet 1   No current facility-administered medications for this visit.     OBJECTIVE: Middle-aged white woman appears well  Vitals:   09/03/18 1305 09/03/18 1306  BP: 108/73 103/61  Pulse:    Resp:    Temp:    SpO2:  Body mass index is 22.9 kg/m.   Filed Weights   09/03/18 1304  Weight: 133 lb 6.4 oz (60.5 kg)  Weight was 153 pounds June 2019 (down 20 pounds)    ECOG FS:1 - Symptomatic but completely ambulatory  Sclerae unicteric, EOMs intact Oropharynx clear and moist No cervical or supraclavicular adenopathy Lungs no rales or rhonchi Heart regular rate and rhythm Abd soft, nontender, positive bowel sounds MSK no focal spinal tenderness, no upper extremity lymphedema Neuro: nonfocal, well oriented, appropriate affect Breasts: Status post right mastectomy.  No evidence of chest wall recurrence.  Left breast is benign.  Both axillae are benign.  LAB  RESULTS:  CMP     Component Value Date/Time   Parks 138 08/31/2018 1123   Parks 139 07/08/2017 0942   K 4.1 08/31/2018 1123   K 3.8 07/08/2017 0942   CL 108 08/31/2018 1123   CL 109 (H) 12/31/2012 0940   CO2 20 (L) 08/31/2018 1123   CO2 18 (L) 07/08/2017 0942   GLUCOSE 108 (H) 08/31/2018 1123   GLUCOSE 156 (H) 07/08/2017 0942   GLUCOSE 127 (H) 12/31/2012 0940   BUN 25 (H) 08/31/2018 1123   BUN 17.7 07/08/2017 0942   CREATININE 1.02 (H) 08/31/2018 1123   CREATININE 1.18 (H) 12/25/2017 0959   CREATININE 1.0 07/08/2017 0942   CALCIUM 10.3 08/31/2018 1123   CALCIUM 10.4 07/08/2017 0942   PROT 8.1 08/31/2018 1123   PROT 7.7 07/08/2017 0942   ALBUMIN 3.8 08/31/2018 1123   ALBUMIN 3.4 (L) 07/08/2017 0942   AST 28 08/31/2018 1123   AST 50 (H) 12/25/2017 0959   AST 54 (H) 07/08/2017 0942   ALT 19 08/31/2018 1123   ALT 53 12/25/2017 0959   ALT 39  07/08/2017 0942   ALKPHOS 179 (H) 08/31/2018 1123   ALKPHOS 232 (H) 07/08/2017 0942   BILITOT 0.3 08/31/2018 1123   BILITOT 0.3 12/25/2017 0959   BILITOT 0.42 07/08/2017 0942   GFRNONAA 57 (L) 08/31/2018 1123   GFRNONAA 47 (L) 12/25/2017 0959   GFRAA >60 08/31/2018 1123   GFRAA 54 (L) 12/25/2017 0959    I No results found for: SPEP  Lab Results  Component Value Date   WBC 6.8 08/31/2018   NEUTROABS 4.8 08/31/2018   HGB 14.3 08/31/2018   HCT 43.9 08/31/2018   MCV 78.4 (L) 08/31/2018   PLT 227 08/31/2018      Chemistry      Component Value Date/Time   Parks 138 08/31/2018 1123   Parks 139 07/08/2017 0942   K 4.1 08/31/2018 1123   K 3.8 07/08/2017 0942   CL 108 08/31/2018 1123   CL 109 (H) 12/31/2012 0940   CO2 20 (L) 08/31/2018 1123   CO2 18 (L) 07/08/2017 0942   BUN 25 (H) 08/31/2018 1123   BUN 17.7 07/08/2017 0942   CREATININE 1.02 (H) 08/31/2018 1123   CREATININE 1.18 (H) 12/25/2017 0959   CREATININE 1.0 07/08/2017 0942      Component Value Date/Time   CALCIUM 10.3 08/31/2018 1123   CALCIUM 10.4 07/08/2017 0942   ALKPHOS 179 (H) 08/31/2018 1123   ALKPHOS 232 (H) 07/08/2017 0942   AST 28 08/31/2018 1123   AST 50 (H) 12/25/2017 0959   AST 54 (H) 07/08/2017 0942   ALT 19 08/31/2018 1123   ALT 53 12/25/2017 0959   ALT 39 07/08/2017 0942   BILITOT 0.3 08/31/2018 1123   BILITOT 0.3 12/25/2017 0959   BILITOT 0.42 07/08/2017 6222  Lab Results  Component Value Date   LABCA2 58 (H) 09/14/2012    No components found for: [id number]  No results for input(s): INR in the last 168 hours.  Urinalysis    Component Value Date/Time   COLORURINE YELLOW 11/25/2017 0901   APPEARANCEUR HAZY (A) 11/25/2017 0901   LABSPEC 1.017 11/25/2017 0901   LABSPEC 1.030 07/13/2015 0841   PHURINE 5.0 11/25/2017 0901   GLUCOSEU NEGATIVE 11/25/2017 0901   GLUCOSEU Negative 07/13/2015 0841   HGBUR MODERATE (A) 11/25/2017 0901   BILIRUBINUR NEGATIVE 11/25/2017 0901   BILIRUBINUR  Negative 07/13/2015 0841   KETONESUR NEGATIVE 11/25/2017 0901   PROTEINUR 100 (A) 11/25/2017 0901   UROBILINOGEN 0.2 07/13/2015 0841   NITRITE NEGATIVE 11/25/2017 0901   LEUKOCYTESUR NEGATIVE 11/25/2017 0901   LEUKOCYTESUR Negative 07/13/2015 0841    STUDIES: Ct Chest W Contrast  Result Date: 08/31/2018 CLINICAL DATA:  68 year old female with history of right-sided breast cancer diagnosed in 2013. Status post right mastectomy and lymph node dissection. Oral chemotherapy in progress. EXAM: CT CHEST WITH CONTRAST TECHNIQUE: Multidetector CT imaging of the chest was performed during intravenous contrast administration. CONTRAST:  80m OMNIPAQUE IOHEXOL 300 MG/ML  SOLN COMPARISON:  Chest CT 03/17/2018 and multiple other priors. FINDINGS: RECIST 1.1 Target Lesions: 1. 5 mm right lower lobe pulmonary nodule (axial image 101 of series 7), stable. 2. Right lower lobe pulmonary nodule (axial image 134 of series 7) increased in size, currently measuring 1.0 x 1.8 cm (previously 0.7 x 1.3 cm). 3. 7 mm short axis subcarinal lymph node (axial image 78 of series 2), stable. Non-target Lesions: 1. Mild right-sided chronic pleural thickening, stable. Cardiovascular: Heart size is normal. There is no significant pericardial fluid, thickening or pericardial calcification. Aortic atherosclerosis. No definite coronary artery calcifications. Mediastinum/Nodes: No pathologically enlarged mediastinal or hilar lymph nodes. Esophagus is unremarkable in appearance. No axillary lymphadenopathy. Surgical clips in the right axilla from prior lymph node dissection. Lungs/Pleura: Enlarging right lower lobe pulmonary nodule (axial image 134 of series 7) measuring 1.0 x 1.8 cm (previously 0.7 x 1.3 cm). 5 mm right lower lobe pulmonary nodule (axial image 101 of series 7), stable. New other a few other scattered 2-4 mm pulmonary nodules are also noted throughout the lungs bilaterally, stable in size, number and distribution to the prior  study. No acute consolidative airspace disease. Chronic right-sided pleural thickening and trace right pleural fluid in the base of the right hemithorax, stable. No left-sided pleural effusion. Upper Abdomen: 1.4 cm left adrenal nodule, stable compared to prior examinations, not characterized on today's non-contrast CT examination, but likely to represent an adrenal adenoma. Musculoskeletal: Status post right-sided modified radical mastectomy and right axillary lymph node dissection. There are no aggressive appearing lytic or blastic lesions noted in the visualized portions of the skeleton. IMPRESSION: 1. Enlarging right lower lobe pulmonary nodule which currently measures 1.0 x 1.8 cm (previously 0.7 x 1.3 cm). 2. Other previously noted pulmonary nodules and additional findings are stable compared to the prior examination. 3. Aortic atherosclerosis. 4. Stable 1.4 cm left adrenal nodule, not characterized on today's examination, but statistically likely an adrenal adenoma. Aortic Atherosclerosis (ICD10-I70.0). Electronically Signed   By: DVinnie LangtonM.D.   On: 08/31/2018 15:23    ASSESSMENT: 68y.o. [place], [place] woman with stage IV breast cancer, on BOLERO-4 trial  (1) status post right mastectomy and sentinel lymph node sampling 03/22/2004 for a right upper-outer quadrant pT2 pN1, stage IIB invasive ductal carcinoma with lobular features, grade 2,  estrogen receptor and progesterone receptor positive, HER-2 negative, with an MIB-1 of 9% ([id number] and [id number])  (2) addition all right axillary lymph node sampling 05/07/2004 showed 2 benign lymph nodes (4 lymph nodes previously removed, so total was one positive lymph node out of 6; [phone])  (3) the patient was evaluated by radiation oncology; no postmastectomy radiation recommended  (4) adjuvant chemotherapy with dose dense doxorubicin and cyclophosphamide x4 cycles (first cycle delayed one week) followed by dose dense paclitaxel x4 was completed  09/18/2004  (5) tamoxifen started March 2006, discontinued 2009  METASTATIC DISEASE: February 2013 (6) presenting with a large pericardial effusion, large right pleural effusion and possible right middle lobe bronchial obstruction February 2013, status post pericardial window placement, fiberoptic bronchoscopy and right Pleurx placement 09/11/2011, with biopsy of the bronchus intermedius and pericardium positive for metastatic breast cancer, estrogen receptor 91% positive with moderate staining intensity, progesterone receptor 100% positive with strong staining intensity, with an MIB-1 of 35%, and no HER-2 amplification, the signals ratio being 1.37 ([id number])  (7) enrolled in BOLERO-4 trial 10/10/2011, receiving letrozole and everolimus  (a) two small areas of enhancement in the cerebellum noted by brain MRI 10/03/2011 were no longer apparent on repeat MRI 08/21/2012-- most recent brain MRI 04/01/2014 showed no evidence of intracranial metastatic disease  (b) sclerotic lesions in left iliac bone and sacrum have not been biopsied; stable; plan is to start zolendronate after patient updates her dental care (extraction planned)  (c) RLL lung nodule, stable (rescanned 07/12/2014 and 08/11/2014) R hilar and subcarinal lymph nodes: stable  (d) most recent CT of the chest:03/25/2018, stable  (8) additional problems:  (a) hepatic steatosis  (b) COPD/ emphysema/ asthma  (c) advanced L hip osteoarthritis, status post left total hip replacement 06/07/2015  (d) aortoiliac atherosclerosis  (e) dental evaluation pending w possible dental extractions  (f) likely thalassemia trait  (g) hyperlipidemia  (9) Bone density concerns: DEXA scan 08/11/2013 was normal  (a) repeat DEXA scan 04/09/2018 shows a T score of -1.8, osteopenia   PLAN: Zakayla is 7 years out from definitive diagnosis of metastatic breast cancer.  The research team gave her little present today to celebrate.  It is really a remarkable  journey that she has had so far.  We reviewed her stress CT scan in detail.  2 of the 3 lymph nodes we have been measured are stable.  The third 1 grew by half a millimeter.  Considering that she has gone in a completely new diet, new medications, and had a cold in January, this could be true true and unrelated.  Accordingly we are continuing her medications as before.  I am convinced her everolimus continues to be of benefit for her.  Instead of repeating a CT of the chest in 6 months however we will repeated in 3 months.  We did discuss the fact that the CA-27-29 has increased some and we will continue to follow that as well.  We reviewed her cholesterol results which are much improved once the Lopid was reintroduced.  She is now on 5 mg of Crestor.  Her LDL is still above 100 however and we will have to continue to work on this.  Overall I am delighted at how well she is doing.  She will return to see me in 3 months after her repeat labs and CT scan  She knows to call for any other problems that may develop before the next visit.  Medical Oncology and Hematology [facility]  [place], [place] [address] Tel. [phone]    Fax. [phone]  I, Jacqualyn Posey am acting as a Education administrator for Chauncey Cruel, MD.   I, Lurline Del MD, have reviewed the above documentation for accuracy and completeness, and I agree with the above.

## 2018-09-03 ENCOUNTER — Telehealth: Payer: Self-pay | Admitting: Oncology

## 2018-09-03 ENCOUNTER — Encounter: Payer: Self-pay | Admitting: *Deleted

## 2018-09-03 ENCOUNTER — Inpatient Hospital Stay (HOSPITAL_BASED_OUTPATIENT_CLINIC_OR_DEPARTMENT_OTHER): Payer: Medicare Other | Admitting: Oncology

## 2018-09-03 VITALS — BP 103/61 | HR 98 | Temp 98.3°F | Resp 18 | Ht 64.0 in | Wt 133.4 lb

## 2018-09-03 DIAGNOSIS — M899 Disorder of bone, unspecified: Secondary | ICD-10-CM | POA: Diagnosis not present

## 2018-09-03 DIAGNOSIS — Z9011 Acquired absence of right breast and nipple: Secondary | ICD-10-CM

## 2018-09-03 DIAGNOSIS — M8580 Other specified disorders of bone density and structure, unspecified site: Secondary | ICD-10-CM

## 2018-09-03 DIAGNOSIS — Z96642 Presence of left artificial hip joint: Secondary | ICD-10-CM | POA: Diagnosis not present

## 2018-09-03 DIAGNOSIS — M1612 Unilateral primary osteoarthritis, left hip: Secondary | ICD-10-CM

## 2018-09-03 DIAGNOSIS — Z170 Estrogen receptor positive status [ER+]: Secondary | ICD-10-CM | POA: Diagnosis not present

## 2018-09-03 DIAGNOSIS — C7801 Secondary malignant neoplasm of right lung: Secondary | ICD-10-CM

## 2018-09-03 DIAGNOSIS — E875 Hyperkalemia: Secondary | ICD-10-CM

## 2018-09-03 DIAGNOSIS — C7800 Secondary malignant neoplasm of unspecified lung: Principal | ICD-10-CM

## 2018-09-03 DIAGNOSIS — Z9221 Personal history of antineoplastic chemotherapy: Secondary | ICD-10-CM | POA: Diagnosis not present

## 2018-09-03 DIAGNOSIS — Z79811 Long term (current) use of aromatase inhibitors: Secondary | ICD-10-CM | POA: Diagnosis not present

## 2018-09-03 DIAGNOSIS — C50911 Malignant neoplasm of unspecified site of right female breast: Secondary | ICD-10-CM

## 2018-09-03 DIAGNOSIS — Z79899 Other long term (current) drug therapy: Secondary | ICD-10-CM | POA: Diagnosis not present

## 2018-09-03 DIAGNOSIS — C50411 Malignant neoplasm of upper-outer quadrant of right female breast: Secondary | ICD-10-CM

## 2018-09-03 DIAGNOSIS — Z006 Encounter for examination for normal comparison and control in clinical research program: Secondary | ICD-10-CM

## 2018-09-03 MED ORDER — INV-EVEROLIMUS (RAD0001) 5MG TABLET NOVARTIS CRAD001Y24135: 2.0000 | ORAL_TABLET | Freq: Every day | ORAL | 0 refills | Status: DC

## 2018-09-03 MED ORDER — ROSUVASTATIN CALCIUM 5 MG PO TABS: 5.0000 mg | ORAL_TABLET | Freq: Every day | ORAL | 1 refills | Status: DC

## 2018-09-03 MED ORDER — VITAMIN D 25 MCG (1000 UNIT) PO TABS: 2000.0000 [IU] | ORAL_TABLET | Freq: Every day | ORAL | 4 refills | Status: DC

## 2018-09-03 NOTE — Telephone Encounter (Signed)
Gave avs and calendar ° °

## 2018-09-03 NOTE — Progress Notes (Signed)
09/03/2018 at 2:31pm - [employer] 4 extension visit phase #13 study notes Thepatientwasintotheclinicthis afternoonunaccompanied forherroutine 12-week follow-up visit and study drug dispensing. Patient returned 6 kits of everolimus with12tablets returned. The pt's overall 12 week study drug compliance is 92.9%for the date range of11/14/2019- 09/02/2018. Patient returned paper calendarsdocumenting daily doses of letrozole2.5 mgand everolimus10 mgwith1 missed dose on 07/07/18.However, the pt said that she must have missed more doses since she returned 12 pills.  Medication boxeswerereturned to thepharmacy for drug accountability by Joretta Bachelor.The pt said that she is feeling well today with no new adverse events.  The pt reports walking 2 miles on most days. ECOG=1 The pt denies any new medication changes.   Patient reports the previously noted AE's (fatigue,hot flashes, dry skin, brittle nails, back pain, and memory impairment)as ongoingand unchanged in severity. Patient reports her blurry vision in her left eye is still ongoing.She has not yet scheduled an appointment with her ophthalmologist for additional follow up.The pt has had intentional, ongoing weight loss because she is managing her diabetes and eating fewer carbs and more vegetables.  Dr. Jana Hakim reviewed the pt's lab values and her most recent CT chest from 08/31/18.  Dr. Jana Hakim said that her tumor marker has been trending upward which means she might be experiencing a slow progression of her disease.  He also noted that one of the chest lymph nodes was slightly bigger.  He informed the pt that her other areas of concern remains stable.  The pt said that she had a "cold" in January and maybe that caused the larger lymph node.  Dr. Jana Hakim said that this LN could be reactive and it may be back to normal at her next scan.  Based on lab results review, scan review,  and history and physical byDr.  Magrinat,thepatient'scondition isacceptable for continued treatment. Dr. Jana Hakim stated the pt's hematological lab abnormalities were "not clinically significant".  Dr. Jana Hakim examined the pt. He feels that she has no definitive evidence of disease recurrence at this point.  In hisopinion,she is still receiving benefit from her study treatment.Dr. Jana Hakim said that he will repeat her CT chest prior to her next appt in April 2020. Six kits of study drug everolimus were dispensed today for the next 12 weeks supply. In addition, patient will continue commercial supply of letrozole 2.5mg  daily. The next study visit is expected to occur on4/29/2020. The research nurse thankedthepatient for hercontinuedparticipationin the study.  The pt was given an inspirational framed picture celebrating her 7 years on the trial and for her perserverance with her treatment.    Adverse Event Log Protocol: [employer] [id number] Cycle: Extension Phase #12(AE date range11/15/19-09/03/18) Ongoing and unchanged in severity:fatigue, hot flashes (mild), memory impairment, dry skin,  blurred vision, back pain and osteopenia "Not clinically significant" abnormal labs not required by the study to be reported as AE's are the following: alkaline phosphatase, creatinine increased, proteinuria (urine), BUN, carbon dioxide Event Grade Onset Date Resolved Date Attribution to letrozole # Attribution to everolimus #  Hyperglycemia  1 03/17/18 Ongoing  No-Not Suspected Yes-Suspected  Hypertriglyceridemia 3 06/11/18 08/31/2018 No-Not Suspected Yes-Suspected  Hypertriglyceridemia 1 08/31/2018 Ongoing No- Not Suspected Yes-Suspected  Cholesterol High  2  06/11/18 08/31/2018 Yes-Suspected  Yes-Suspected  Cholesterol High 1 08/31/2018 Ongoing Yes-Suspected Yes-Suspected  Nail changes (brittle) 1 06/11/18 Ongoing Yes-Suspected No-Not Suspected  Weight Loss  3 06/11/18 Ongoing No-Not Suspected No-Not Suspected   Brion Aliment  RN, BSN, [place] Nurse 09/03/2018 4:47 PM

## 2018-10-06 IMAGING — MG DIGITAL SCREENING UNILATERAL LEFT MAMMOGRAM WITH CAD
3 series · 3 of 3 positions shown · non-contrast
Comparison: Previous exam(s).

CLINICAL DATA: Screening.

EXAM:
DIGITAL SCREENING UNILATERAL LEFT MAMMOGRAM WITH CAD

[L MLO (1 of 2)]
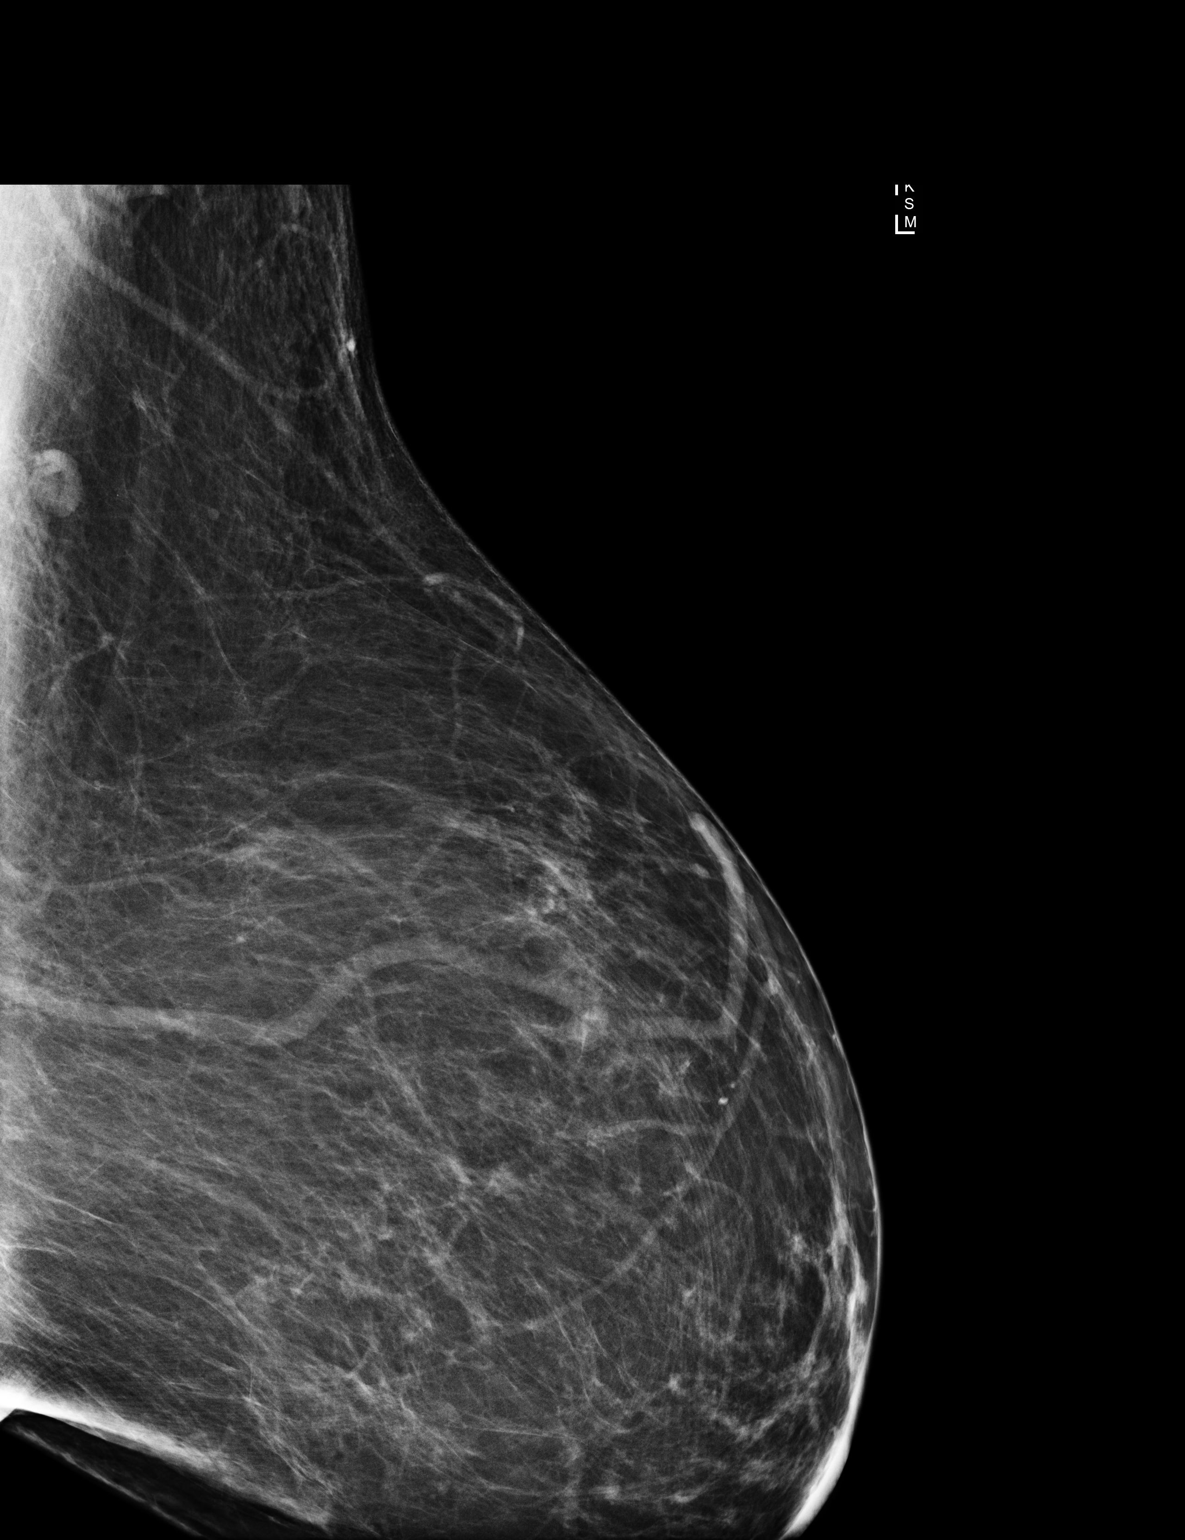

[L CC]
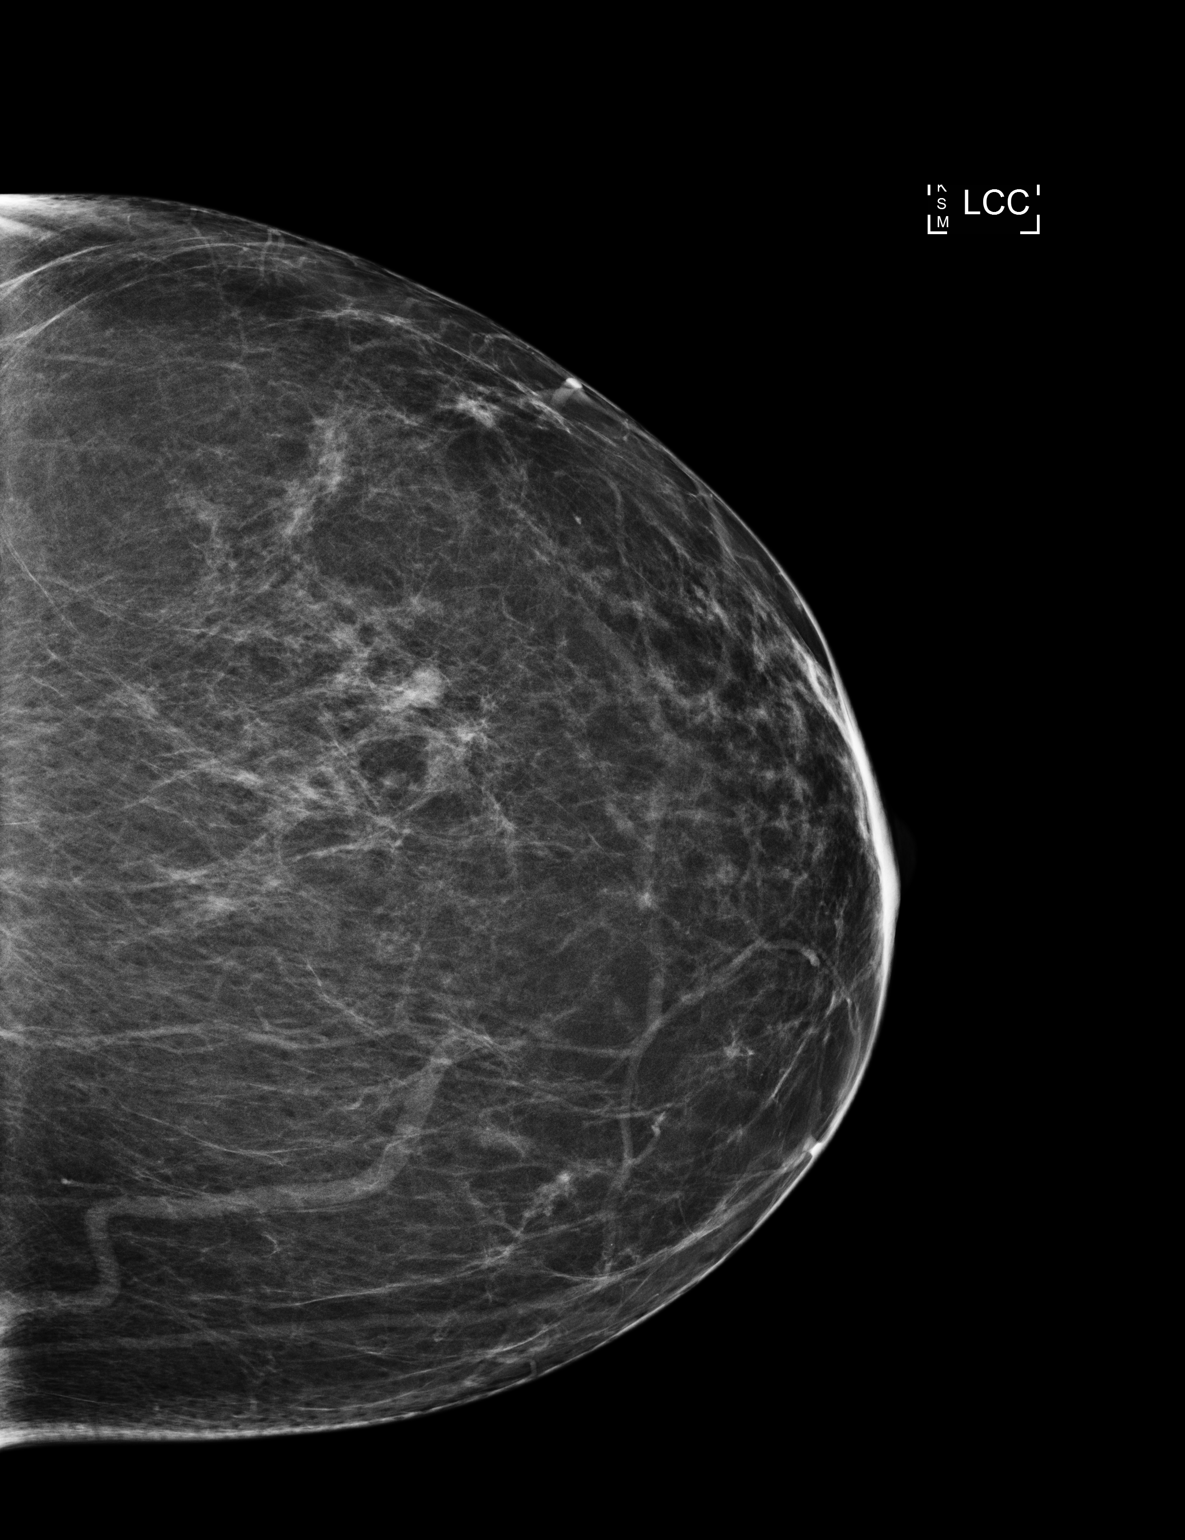

[L MLO (2 of 2)]
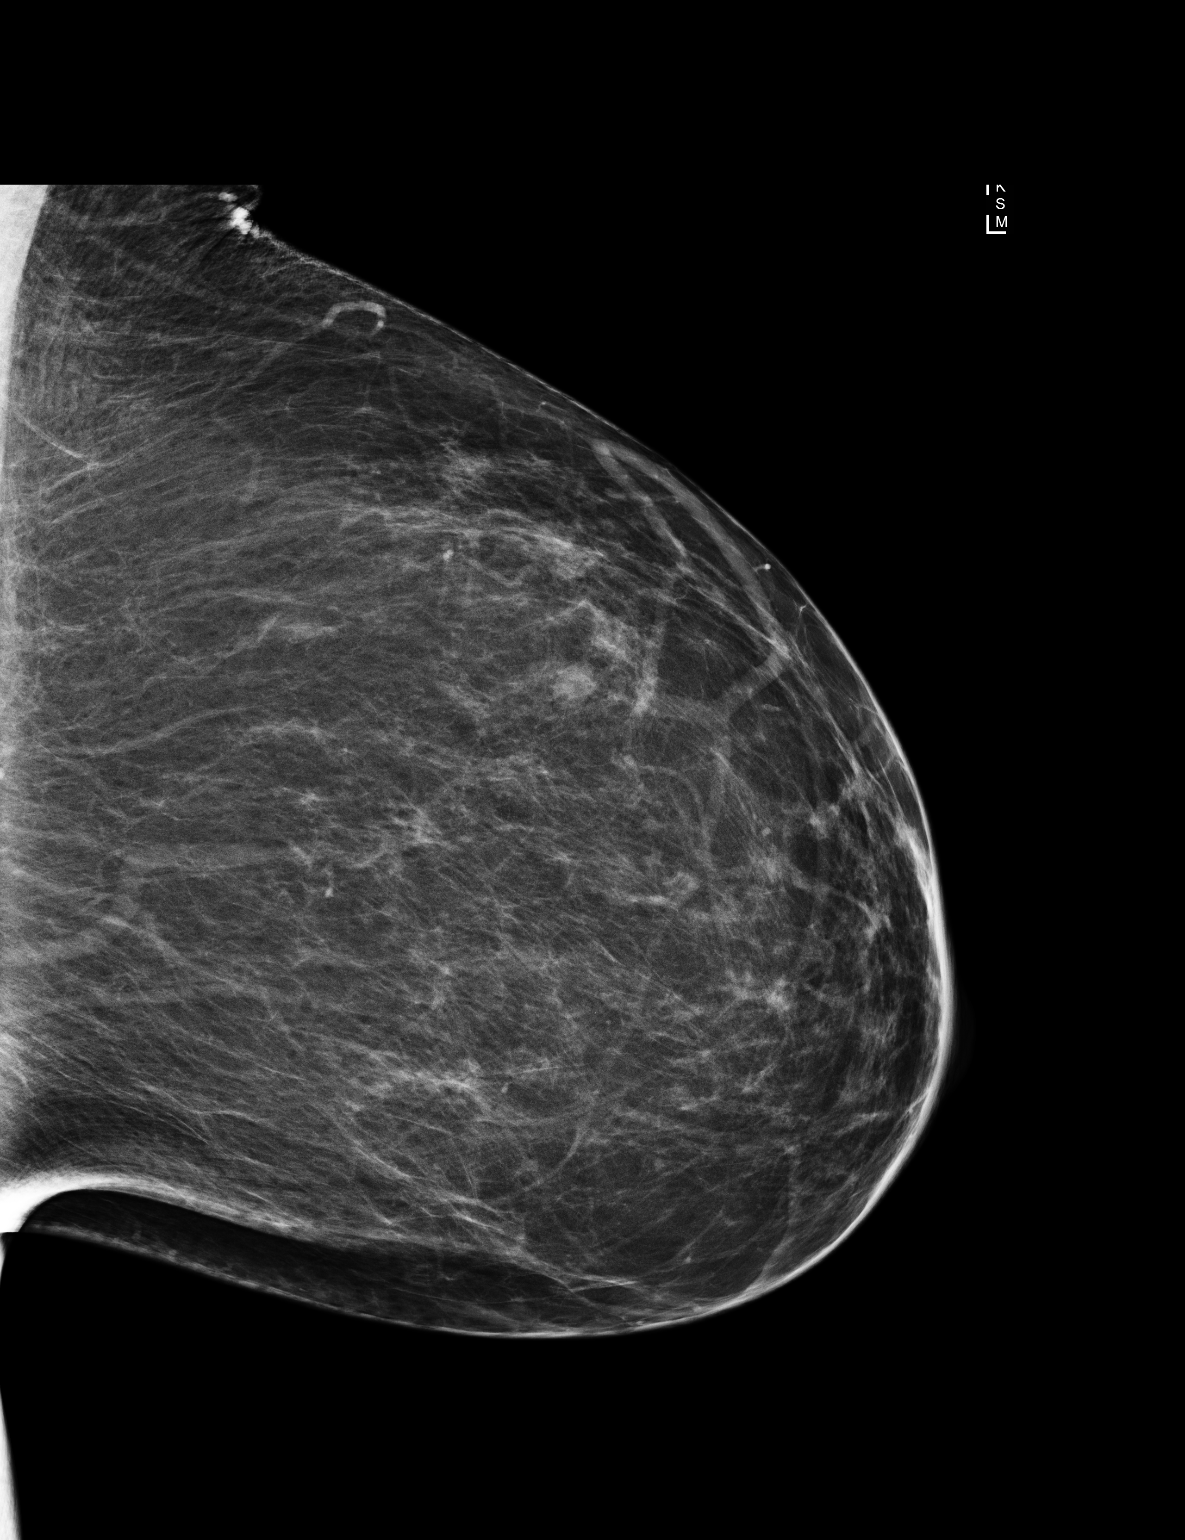

[3 of 3 positions shown; findings below may reference images not displayed]

ACR Breast Density Category b: There are scattered areas of
fibroglandular density.
FINDINGS: The patient has had a right mastectomy. There are no findings
suspicious for malignancy.

Images were processed with CAD.
IMPRESSION: No mammographic evidence of malignancy. A result letter of this
screening mammogram will be mailed directly to the patient.

RECOMMENDATION:
Screening mammogram in one year.  (Code:[id number])

BI-RADS CATEGORY  1: Negative.

## 2018-11-19 ENCOUNTER — Ambulatory Visit (HOSPITAL_COMMUNITY): Admission: RE | Admit: 2018-11-19 | Payer: Medicare Other | Source: Ambulatory Visit

## 2018-11-19 ENCOUNTER — Encounter (HOSPITAL_COMMUNITY): Payer: Self-pay

## 2018-11-19 ENCOUNTER — Inpatient Hospital Stay: Payer: Medicare Other | Attending: Oncology

## 2018-11-19 ENCOUNTER — Ambulatory Visit (HOSPITAL_COMMUNITY): Payer: Medicare Other

## 2018-11-19 ENCOUNTER — Other Ambulatory Visit: Payer: Self-pay

## 2018-11-19 ENCOUNTER — Ambulatory Visit (HOSPITAL_COMMUNITY)
Admission: RE | Admit: 2018-11-19 | Discharge: 2018-11-19 | Disposition: A | Discharge status: Home / Self Care | Payer: Medicare Other | Source: Ambulatory Visit | Attending: Oncology | Admitting: Oncology

## 2018-11-19 DIAGNOSIS — Z170 Estrogen receptor positive status [ER+]: Secondary | ICD-10-CM | POA: Insufficient documentation

## 2018-11-19 DIAGNOSIS — Z006 Encounter for examination for normal comparison and control in clinical research program: Secondary | ICD-10-CM | POA: Diagnosis not present

## 2018-11-19 DIAGNOSIS — C50411 Malignant neoplasm of upper-outer quadrant of right female breast: Secondary | ICD-10-CM | POA: Insufficient documentation

## 2018-11-19 DIAGNOSIS — Z79811 Long term (current) use of aromatase inhibitors: Secondary | ICD-10-CM | POA: Insufficient documentation

## 2018-11-19 DIAGNOSIS — Z9011 Acquired absence of right breast and nipple: Secondary | ICD-10-CM | POA: Insufficient documentation

## 2018-11-19 DIAGNOSIS — Z9221 Personal history of antineoplastic chemotherapy: Secondary | ICD-10-CM | POA: Diagnosis not present

## 2018-11-19 DIAGNOSIS — C801 Malignant (primary) neoplasm, unspecified: Secondary | ICD-10-CM

## 2018-11-19 DIAGNOSIS — I313 Pericardial effusion (noninflammatory): Secondary | ICD-10-CM

## 2018-11-19 DIAGNOSIS — M8580 Other specified disorders of bone density and structure, unspecified site: Secondary | ICD-10-CM | POA: Diagnosis not present

## 2018-11-19 DIAGNOSIS — C7801 Secondary malignant neoplasm of right lung: Secondary | ICD-10-CM | POA: Diagnosis not present

## 2018-11-19 DIAGNOSIS — C50911 Malignant neoplasm of unspecified site of right female breast: Secondary | ICD-10-CM | POA: Diagnosis not present

## 2018-11-19 DIAGNOSIS — C7800 Secondary malignant neoplasm of unspecified lung: Secondary | ICD-10-CM

## 2018-11-19 DIAGNOSIS — I3131 Malignant pericardial effusion in diseases classified elsewhere: Secondary | ICD-10-CM

## 2018-11-19 LAB — CBC WITH DIFFERENTIAL/PLATELET
Abs Immature Granulocytes: 0.04 10*3/uL (ref 0.00–0.07)
Basophils Absolute: 0.1 10*3/uL (ref 0.0–0.1)
Basophils Relative: 1 %
Eosinophils Absolute: 0.1 10*3/uL (ref 0.0–0.5)
Eosinophils Relative: 1 %
HCT: 43.6 % (ref 36.0–46.0)
Hemoglobin: 13.9 g/dL (ref 12.0–15.0)
Immature Granulocytes: 1 %
Lymphocytes Relative: 19 %
Lymphs Abs: 1.3 10*3/uL (ref 0.7–4.0)
MCH: 25.9 pg — ABNORMAL LOW (ref 26.0–34.0)
MCHC: 31.9 g/dL (ref 30.0–36.0)
MCV: 81.3 fL (ref 80.0–100.0)
Monocytes Absolute: 0.9 10*3/uL (ref 0.1–1.0)
Monocytes Relative: 14 %
Neutro Abs: 4.4 10*3/uL (ref 1.7–7.7)
Neutrophils Relative %: 64 %
Platelets: 255 10*3/uL (ref 150–400)
RBC: 5.36 MIL/uL — ABNORMAL HIGH (ref 3.87–5.11)
RDW: 14.2 % (ref 11.5–15.5)
WBC: 6.8 10*3/uL (ref 4.0–10.5)
nRBC: 0 % (ref 0.0–0.2)

## 2018-11-19 LAB — COMPREHENSIVE METABOLIC PANEL
ALT: 12 U/L (ref 0–44)
AST: 20 U/L (ref 15–41)
Albumin: 3.7 g/dL (ref 3.5–5.0)
Alkaline Phosphatase: 164 U/L — ABNORMAL HIGH (ref 38–126)
Anion gap: 12 (ref 5–15)
BUN: 29 mg/dL — ABNORMAL HIGH (ref 8–23)
CO2: 20 mmol/L — ABNORMAL LOW (ref 22–32)
Calcium: 10.3 mg/dL (ref 8.9–10.3)
Chloride: 108 mmol/L (ref 98–111)
Creatinine, Ser: 1.05 mg/dL — ABNORMAL HIGH (ref 0.44–1.00)
GFR calc Af Amer: >60 mL/min (ref >60)
GFR calc non Af Amer: 55 mL/min — ABNORMAL LOW (ref >60)
Glucose, Bld: 88 mg/dL (ref 70–99)
Potassium: 4.4 mmol/L (ref 3.5–5.1)
Sodium: 140 mmol/L (ref 135–145)
Total Bilirubin: 0.2 mg/dL — ABNORMAL LOW (ref 0.3–1.2)
Total Protein: 8.2 g/dL — ABNORMAL HIGH (ref 6.5–8.1)

## 2018-11-19 MED ORDER — SODIUM CHLORIDE (PF) 0.9 % IJ SOLN
INTRAMUSCULAR | Status: AC
  Filled 2018-11-19: qty 50

## 2018-11-19 MED ORDER — IOHEXOL 300 MG/ML  SOLN
75.0000 mL | Freq: Once | INTRAMUSCULAR | Status: AC | PRN
  Administered 2018-11-19: 75 mL via INTRAVENOUS

## 2018-11-20 LAB — CANCER ANTIGEN 27.29: CA 27.29: 175.1 U/mL — ABNORMAL HIGH (ref 0.0–38.6)

## 2018-11-25 ENCOUNTER — Other Ambulatory Visit: Payer: Self-pay

## 2018-11-25 ENCOUNTER — Encounter: Payer: Self-pay | Admitting: *Deleted

## 2018-11-25 ENCOUNTER — Inpatient Hospital Stay (HOSPITAL_BASED_OUTPATIENT_CLINIC_OR_DEPARTMENT_OTHER): Payer: Medicare Other | Admitting: Oncology

## 2018-11-25 VITALS — BP 115/63 | HR 92 | Temp 97.8°F | Resp 17 | Ht 64.0 in | Wt 129.8 lb

## 2018-11-25 DIAGNOSIS — Z79811 Long term (current) use of aromatase inhibitors: Secondary | ICD-10-CM

## 2018-11-25 DIAGNOSIS — Z170 Estrogen receptor positive status [ER+]: Secondary | ICD-10-CM

## 2018-11-25 DIAGNOSIS — M8580 Other specified disorders of bone density and structure, unspecified site: Secondary | ICD-10-CM

## 2018-11-25 DIAGNOSIS — C50911 Malignant neoplasm of unspecified site of right female breast: Secondary | ICD-10-CM

## 2018-11-25 DIAGNOSIS — Z006 Encounter for examination for normal comparison and control in clinical research program: Secondary | ICD-10-CM

## 2018-11-25 DIAGNOSIS — C7801 Secondary malignant neoplasm of right lung: Secondary | ICD-10-CM | POA: Diagnosis not present

## 2018-11-25 DIAGNOSIS — Z9221 Personal history of antineoplastic chemotherapy: Secondary | ICD-10-CM | POA: Diagnosis not present

## 2018-11-25 DIAGNOSIS — C7800 Secondary malignant neoplasm of unspecified lung: Principal | ICD-10-CM

## 2018-11-25 DIAGNOSIS — C50411 Malignant neoplasm of upper-outer quadrant of right female breast: Secondary | ICD-10-CM

## 2018-11-25 DIAGNOSIS — Z9011 Acquired absence of right breast and nipple: Secondary | ICD-10-CM

## 2018-11-25 DIAGNOSIS — C801 Malignant (primary) neoplasm, unspecified: Secondary | ICD-10-CM

## 2018-11-25 DIAGNOSIS — I313 Pericardial effusion (noninflammatory): Principal | ICD-10-CM

## 2018-11-25 DIAGNOSIS — I3131 Malignant pericardial effusion in diseases classified elsewhere: Secondary | ICD-10-CM

## 2018-11-25 MED ORDER — INV-EVEROLIMUS (RAD0001) 5MG TABLET NOVARTIS CRAD001Y24135: 2.0000 | ORAL_TABLET | Freq: Every day | ORAL | 0 refills | Status: DC

## 2018-11-25 NOTE — Progress Notes (Signed)
[place]  Telephone:(336) [phone] Fax:(336) [phone]    ID: Barbara Parks DOB: 1950/12/13  MR#: [id number]  BMS#:[id number]  Patient Care Team: [facility] as PCP - General (Family Medicine) Jerline Pain, MD (Cardiology) Gaye Pollack, MD (Cardiothoracic Surgery) Mervin Ramires, Virgie Dad, MD as Consulting Physician (Oncology) OTHER MD: Dr Erroll Luna- Merlene Laughter, DDS; Gaynelle Arabian MD   CHIEF COMPLAINT: metastatic breast cancer, on BOLERO study  CURRENT TREATMENT: letrozole + everolimus   INTERVAL HISTORY: Ronae returns today for follow-up and treatment of her metastatic estrogen receptor positive breast cancer.  She continues on letrozole. She tolerates this well and without any noticeable side effects.    She also continues on everolimus. She tolerates this well and without any noticeable side effects.   Quincie's last bone density screening on 04/09/2018, showed a T-score of -1.8, which is considered mild to moderately osteopenic.   Since her last visit, she underwent repeat chest CT scan on 11/19/2018, with results revealing: stable right lower lobe pulmonary nodules; new nodule in the right upper lobe with hazy margin, differential includes new metastatic lesion versus focus of infection; stable enlargement of the left adrenal gland over multiple comparison exams; nodular focus in the fundus of the gallbladder that is unchanged since 2017.  She does have a steadily rising CA-27-29.  Lab Results  Component Value Date   CA2729 175.1 (H) 11/19/2018   CA2729 126.4 (H) 08/31/2018   CA2729 106.2 (H) 06/11/2018   CA2729 79.2 (H) 03/17/2018   CA2729 74.0 (H) 12/25/2017    REVIEW OF SYSTEMS: Michael reports doing well overall. She does, however, report muscle pains that vary in location and intensity. She notes pain especially in her shoulders. She enjoys playing golf and would love to go back out. She reports she has been doing a lot of  gardening at home. The patient denies unusual headaches, visual changes, nausea, vomiting, stiff neck, dizziness, or gait imbalance. There has been no cough, phlegm production, or pleurisy, no chest pain or pressure, and no change in bowel or bladder habits. The patient denies fever, rash, bleeding, unexplained fatigue or unexplained weight loss. A detailed review of systems was otherwise entirely negative.    BREAST CANCER HISTORY: From Dr. Collier [name]'s original intake note 04/13/2004:  "This woman has been in good health all of her life. She recently moved from [place] to work here.  She palpated a mass at about the 12 oclock position in mid-July. She has not noticed any nipple retraction or skin changes.  She was seen by her primary care doctor who subsequently referred her for a mammogram.  Mammogram was performed on 03/01/04. This demonstrated a spiculated 2 cm mass at the 12 oclock position in the right breast.  Upper outer quadrant of the left breast shows some distortion.   Physical exam at that time showed a firm, nontender nodule at the 12 oclock position in the right breast, 5 cm from the nipple.  Ultrasound of this area showed a hypoechoic ill-defined mass, measuring 2.2 x 1.3 x 1.4 cm.  Physical exam of the left breast showed general vague thickening, upper outer quadrant of the left breast with a discrete palpable mass. The ultrasound performed showed a single hypoechoic ill-defined nodule at the 1 oclock position, measuring 7 x 5 x 6 mm.  She had biopsies of both lesions on 03/02/03.  Needle core biopsy of the lesion on the right breast revealed invasive mammary carcinoma.  Needle core biopsy  of the left breast showed a complex fibroadenoma.  Prognostic panel of the lesion on the right breast showed it to be ER positive at 73%, PR positive at 90% and proliferative index 9%, HER-2 was 1+.  Patient was referred to Dr. Annamaria Boots, who performed a simple mastectomy with sentinel lymph node  evaluation on 03/22/04.  Final pathology showed this to be an invasive ductal carcinoma  with lobular features, measuring 2.2 cm, grade 2 of 3.  Margins negative for carcinoma.  Invasive ductal carcinoma was extended to involve deep dermis of the nipple.  Lymphovascular invasion was identified.  Total of 4 sentinel lymph nodes were evaluated.  Touch imprints at the time of the OR was felt to be negative.  Subsequent evaluation showed a 5 mm focus of metastatic carcinoma in one of the four lymph nodes on microscopic after sectioning.  There was extracapsular extension  of one lymph node as well. The remaining three lymph nodes were all negative."  The patient's subsequent history is detailed above.   PAST MEDICAL HISTORY: Past Medical History:  Diagnosis Date   Arthritis    left hip   Asthma    breast ca 2005   breast/chemo R mastectomy   Dermatitis    Diabetes mellitus without complication ([place])    GERD (gastroesophageal reflux disease)    Hypercholesteremia    Metastasis to lung ([place]) dx'd 08/2011   Osteopenia due to cancer therapy 09/09/2013   Palpitations    Personal history of chemotherapy    Shortness of breath     PAST SURGICAL HISTORY: Past Surgical History:  Procedure Laterality Date   BREAST SURGERY  2005   right   CATARACT EXTRACTION W/PHACO Left 01/18/2014   Procedure: CATARACT EXTRACTION PHACO AND INTRAOCULAR LENS PLACEMENT ([place]);  Surgeon: Elta Guadeloupe T. Gershon Crane, MD;  Location: [facility];  Service: Ophthalmology;  Laterality: Left;  CDE 15.79   CATARACT EXTRACTION W/PHACO Right 02/08/2014   Procedure: CATARACT EXTRACTION PHACO AND INTRAOCULAR LENS PLACEMENT ([facility]);  Surgeon: Elta Guadeloupe T. Gershon Crane, MD;  Location: [facility];  Service: Ophthalmology;  Laterality: Right;  CDE 4.41   CHEST TUBE INSERTION  09/11/2011   Procedure: INSERTION PLEURAL DRAINAGE CATHETER;  Surgeon: Gaye Pollack, MD;  Location: [place];  Service: Thoracic;  Laterality: Right;   MASTECTOMY Right     PERICARDIAL WINDOW  09/11/2011   Procedure: PERICARDIAL WINDOW;  Surgeon: Gaye Pollack, MD;  Location: [place];  Service: Thoracic;  Laterality: N/A;   REMOVAL OF PLEURAL DRAINAGE CATHETER  12/19/2011   Procedure: REMOVAL OF PLEURAL DRAINAGE CATHETER;  Surgeon: Gaye Pollack, MD;  Location: [place];  Service: Thoracic;  Laterality: Right;  TO BE DONE IN MINOR ROOM, [place] Left 06/07/2015   Procedure: LEFT TOTAL HIP ARTHROPLASTY ANTERIOR APPROACH;  Surgeon: Gaynelle Arabian, MD;  Location: [facility];  Service: Orthopedics;  Laterality: Left;   VIDEO BRONCHOSCOPY  09/11/2011   Procedure: VIDEO BRONCHOSCOPY;  Surgeon: Gaye Pollack, MD;  Location: [facility];  Service: Thoracic;  Laterality: N/A;    FAMILY HISTORY Family History  Problem Relation Age of Onset   Cancer Mother        breast   Heart disease Father    Diabetes Other    Anesthesia problems Neg Hx    The patient's mother was diagnosed with breast cancer at age 39, she died age 27 from congestive heart failure..The patient's father died from heart disease at  age 67.  She has one sister alive & well.  Two brothers alive & well, one with diabetes. The patient's sister was also diagnosed with breast cancer, and was tested for the BRCA gene, and was negative. The patient herself has not been tested. There is no history of ovarian cancer in the family   GYNECOLOGIC HISTORY:  No LMP recorded. Patient is postmenopausal. Menarche age 60, the patient is GX P0. She stopped having periods with chemotherapy in 2005. 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 She was a Archivist. She is single, lives alone with her Shitzu-poodle Sammie.. Family is all in the [place] area.     ADVANCED DIRECTIVES: Not in place. At the 02/23/2014 visit the patient was given the appropriate documents  to complete and notarize at her discretion. She tells me she is planning to name her sister, Billie Ruddy, as healthcare power of attorney. [name] can be reached at [phone]   HEALTH MAINTENANCE: Social History   Tobacco Use   Smoking status: Former Smoker    Packs/day: 1.50    Years: 30.00    Pack years: 45.00    Types: Cigarettes    Last attempt to quit: 09/10/2007    Years since quitting: 11.2   Smokeless tobacco: Never Used  Substance Use Topics   Alcohol use: No   Drug use: No    Colonoscopy:  PAP:  Bone density: 04/09/2018, T score -1.8  Lipid panel:  Allergies  Allergen Reactions   Aspirin     REACTION: upset stomach   Latex Other (See Comments)    Blistering and skin peels off   Nsaids Nausea And Vomiting    Extreme nausea and vomiting    Current Outpatient Medications  Medication Sig Dispense Refill   blood glucose meter kit and supplies KIT Dispense based on patient and insurance preference. Use up to four times daily as directed. (FOR ICD-9 250.00, 250.01). 1 each 0   cholecalciferol (VITAMIN D3) 25 MCG (1000 UT) tablet Take 2 tablets (2,000 Units total) by mouth daily. 180 tablet 4   gemfibrozil (LOPID) 600 MG tablet Take 1 tablet (600 mg total) by mouth 2 (two) times daily before a meal. 180 tablet 1   Investigational everolimus (RAD001) 5 MG tablet Novartis [id number] Take 2 tablets by mouth daily. Take with a glass of water. 168 tablet 0   letrozole (FEMARA) 2.5 MG tablet Take 1 tablet (2.5 mg total) by mouth daily. 90 tablet 3   loperamide (IMODIUM) 2 MG capsule Take 2 mg by mouth 4 (four) times daily as needed for diarrhea or loose stools. Reported on 11/30/2015     metFORMIN (GLUCOPHAGE-XR) 500 MG 24 hr tablet Take 1 tablet (500 mg total) by mouth 3 (three) times daily before meals.  3   rosuvastatin (CRESTOR) 5 MG tablet Take 1 tablet (5 mg total) by mouth daily. 90 tablet 1   No current facility-administered medications for this  visit.     OBJECTIVE: Middle-aged white woman in no acute distress  Vitals:   11/25/18 1432 11/25/18 1433  BP: 106/61 115/63  Pulse: 92   Resp:    Temp:    SpO2:       Body mass index is 22.28 kg/m.   Filed Weights   11/25/18 1431  Weight: 129 lb 12.8 oz (58.9 kg)  Weight was 153 pounds June  2019 (down 20 pounds)    ECOG FS:1 - Symptomatic but completely ambulatory  Sclerae unicteric, pupils round and equal Oropharynx clear slightly dry No cervical or supraclavicular adenopathy Lungs no rales or rhonchi Heart regular rate and rhythm Abd soft, nontender, positive bowel sounds MSK no focal spinal tenderness, no upper extremity lymphedema Neuro: nonfocal, well oriented, appropriate affect Breasts: Status post right mastectomy, with no evidence of local recurrence.  Left breast is unremarkable.  Both axillae are benign.  LAB  RESULTS:  CMP     Component Value Date/Time   NA 140 11/19/2018 1125   NA 139 07/08/2017 0942   K 4.4 11/19/2018 1125   K 3.8 07/08/2017 0942   CL 108 11/19/2018 1125   CL 109 (H) 12/31/2012 0940   CO2 20 (L) 11/19/2018 1125   CO2 18 (L) 07/08/2017 0942   GLUCOSE 88 11/19/2018 1125   GLUCOSE 156 (H) 07/08/2017 0942   GLUCOSE 127 (H) 12/31/2012 0940   BUN 29 (H) 11/19/2018 1125   BUN 17.7 07/08/2017 0942   CREATININE 1.05 (H) 11/19/2018 1125   CREATININE 1.18 (H) 12/25/2017 0959   CREATININE 1.0 07/08/2017 0942   CALCIUM 10.3 11/19/2018 1125   CALCIUM 10.4 07/08/2017 0942   PROT 8.2 (H) 11/19/2018 1125   PROT 7.7 07/08/2017 0942   ALBUMIN 3.7 11/19/2018 1125   ALBUMIN 3.4 (L) 07/08/2017 0942   AST 20 11/19/2018 1125   AST 50 (H) 12/25/2017 0959   AST 54 (H) 07/08/2017 0942   ALT 12 11/19/2018 1125   ALT 53 12/25/2017 0959   ALT 39 07/08/2017 0942   ALKPHOS 164 (H) 11/19/2018 1125   ALKPHOS 232 (H) 07/08/2017 0942   BILITOT 0.2 (L) 11/19/2018 1125   BILITOT 0.3 12/25/2017 0959   BILITOT 0.42 07/08/2017 0942   GFRNONAA 55 (L)  11/19/2018 1125   GFRNONAA 47 (L) 12/25/2017 0959   GFRAA >60 11/19/2018 1125   GFRAA 54 (L) 12/25/2017 0959    I No results found for: SPEP  Lab Results  Component Value Date   WBC 6.8 11/19/2018   NEUTROABS 4.4 11/19/2018   HGB 13.9 11/19/2018   HCT 43.6 11/19/2018   MCV 81.3 11/19/2018   PLT 255 11/19/2018      Chemistry      Component Value Date/Time   NA 140 11/19/2018 1125   NA 139 07/08/2017 0942   K 4.4 11/19/2018 1125   K 3.8 07/08/2017 0942   CL 108 11/19/2018 1125   CL 109 (H) 12/31/2012 0940   CO2 20 (L) 11/19/2018 1125   CO2 18 (L) 07/08/2017 0942   BUN 29 (H) 11/19/2018 1125   BUN 17.7 07/08/2017 0942   CREATININE 1.05 (H) 11/19/2018 1125   CREATININE 1.18 (H) 12/25/2017 0959   CREATININE 1.0 07/08/2017 0942      Component Value Date/Time   CALCIUM 10.3 11/19/2018 1125   CALCIUM 10.4 07/08/2017 0942   ALKPHOS 164 (H) 11/19/2018 1125   ALKPHOS 232 (H) 07/08/2017 0942   AST 20 11/19/2018 1125   AST 50 (H) 12/25/2017 0959   AST 54 (H) 07/08/2017 0942   ALT 12 11/19/2018 1125   ALT 53 12/25/2017 0959   ALT 39 07/08/2017 0942   BILITOT 0.2 (L) 11/19/2018 1125   BILITOT 0.3 12/25/2017 0959   BILITOT 0.42 07/08/2017 0942       Lab Results  Component Value Date   LABCA2 58 (H) 09/14/2012    No components found for: [id number]  No results  for input(s): INR in the last 168 hours.  Urinalysis    Component Value Date/Time   COLORURINE YELLOW 11/25/2017 0901   APPEARANCEUR HAZY (A) 11/25/2017 0901   LABSPEC 1.017 11/25/2017 0901   LABSPEC 1.030 07/13/2015 0841   PHURINE 5.0 11/25/2017 0901   GLUCOSEU NEGATIVE 11/25/2017 0901   GLUCOSEU Negative 07/13/2015 0841   HGBUR MODERATE (A) 11/25/2017 0901   BILIRUBINUR NEGATIVE 11/25/2017 0901   BILIRUBINUR Negative 07/13/2015 0841   KETONESUR NEGATIVE 11/25/2017 0901   PROTEINUR 100 (A) 11/25/2017 0901   UROBILINOGEN 0.2 07/13/2015 0841   NITRITE NEGATIVE 11/25/2017 0901   LEUKOCYTESUR NEGATIVE  11/25/2017 0901   LEUKOCYTESUR Negative 07/13/2015 0841    STUDIES: Ct Chest W Contrast  Result Date: 11/20/2018 CLINICAL DATA:  Breast cancer follow-up.  Pulmonary lesions. EXAM: CT CHEST WITH CONTRAST TECHNIQUE: Multidetector CT imaging of the chest was performed during intravenous contrast administration. CONTRAST:  63m OMNIPAQUE IOHEXOL 300 MG/ML  SOLN RECIST 1.0 Target Lesions: 1. 5 mm RIGHT lower lobe pulmonary nodule unchanged 5 mm (image 87/7) 2. Subcarinal lymph node measures 7 mm (image 72/2) unchanged. 3. RIGHT hilar lymph node not measurable Non-target Lesions: 1. small RIGHT effusion. 2. Stable small nodules in the superior segment of the RIGHT lower lobe. 3. Indeterminate RIGHT upper lobe pulmonary nodule identified on 04/19/2015-not identified 4. "New" (07/10/2017) RIGHT lower lobe nodule (image 121/7) measuring 17 mm which compares to 18 mm for no significant change 5. New nodule (11/19/2018) in the RIGHT upper lobe measures 5 mm (image 53/7) - indeterminate etiology. COMPARISON:  CT 08/31/2018, 03/17/2018, 04/19/2015 FINDINGS: Cardiovascular: No significant vascular findings. Normal heart size. No pericardial effusion. Mediastinum/Nodes: No significant mediastinal hilar lymphadenopathy. Lungs/Pleura: Largest nodule in the RIGHT lower lobe measures 17 mm (image 121/7) not changed from 18 mm on most recent comparison exam. RIGHT lower lobe pulmonary nodule along the pleural surface measures 5 mm (image 87/7) unchanged. There is a new pulmonary nodule in the RIGHT upper lobe measuring 5 mm (image 54/7). There is hazy border to this nodule. Upper Abdomen: Limited view of the liver, kidneys, pancreas are unremarkable. Normal adrenal glands. Gallbladder distended to 46 mm. Indeterminate thickening along the gallbladder fundus measures 12 mm (image 159/2). This region not imaged on recent chest CTs. Nodule present on CT 11/28/2015 measuring 10 mm. Nodular thickening of the LEFT adrenal gland to 12  mm similar comparison exams. Lung breast cancer Musculoskeletal: No aggressive osseous lesion. IMPRESSION: 1. Stable RIGHT lower lobe pulmonary nodules. 2. New nodule in the RIGHT upper lobe with hazy margin. Differential includes new metastatic lesion versus focus of infection. Recommend attention on follow-up. 3. Stable enlargement of the LEFT adrenal gland over multiple comparison exams. 4. Nodular focus in the fundus of the gallbladder. No change from 2017 therefore favored benign Electronically Signed   By: SSuzy BouchardM.D.   On: 11/20/2018 09:29    ASSESSMENT: 68y.o. [place], [place] woman with stage IV breast cancer, on BOLERO-4 trial  (1) status post right mastectomy and sentinel lymph node sampling 03/22/2004 for a right upper-outer quadrant pT2 pN1, stage IIB invasive ductal carcinoma with lobular features, grade 2, estrogen receptor and progesterone receptor positive, HER-2 negative, with an MIB-1 of 9% (([id number]  (2) addition all right axillary lymph node sampling 05/07/2004 showed 2 benign lymph nodes (4 lymph nodes previously removed, so total was one positive lymph node out of 6; [id number]  (3) the patient was evaluated by radiation oncology; no postmastectomy radiation recommended  (  4) adjuvant chemotherapy with dose dense doxorubicin and cyclophosphamide x4 cycles (first cycle delayed one week) followed by dose dense paclitaxel x4 was completed 09/18/2004  (5) tamoxifen started March 2006, discontinued 2009  METASTATIC DISEASE: February 2013 (6) presenting with a large pericardial effusion, large right pleural effusion and possible right middle lobe bronchial obstruction February 2013, status post pericardial window placement, fiberoptic bronchoscopy and right Pleurx placement 09/11/2011, with biopsy of the bronchus intermedius and pericardium positive for metastatic breast cancer, estrogen receptor 91% positive with moderate staining intensity, progesterone receptor  100% positive with strong staining intensity, with an MIB-1 of 35%, and no HER-2 amplification, the signals ratio being 1.37 ([id number])  (7) enrolled in BOLERO-4 trial 10/10/2011, receiving letrozole and everolimus  (a) two small areas of enhancement in the cerebellum noted by brain MRI 10/03/2011 were no longer apparent on repeat MRI 08/21/2012-- most recent brain MRI 04/01/2014 showed no evidence of intracranial metastatic disease  (b) sclerotic lesions in left iliac bone and sacrum have not been biopsied; stable; plan is to start zolendronate after patient updates her dental care (extraction planned)  (c) RLL lung nodule, stable (rescanned 07/12/2014 and 08/11/2014) R hilar and subcarinal lymph nodes: stable  (d) most recent CT of the chest:03/25/2018, stable  (8) additional problems:  (a) hepatic steatosis  (b) COPD/ emphysema/ asthma  (c) advanced L hip osteoarthritis, status post left total hip replacement 06/07/2015  (d) aortoiliac atherosclerosis  (e) dental evaluation pending w possible dental extractions  (f) likely thalassemia trait  (g) hyperlipidemia  (9) Bone density concerns: DEXA scan 08/11/2013 was normal  (a) repeat DEXA scan 04/09/2018 shows a T score of -1.8, osteopenia   PLAN: Kaelene is now a little over 7 years out from definitive surgery for her breast cancer with very well-controlled disease this is very favorable.  She continues to derive clinical benefit from the letrozole/everolimus and the plan is to continue that until we have definitive evidence of disease progression.  We reviewed the images from her CT of the chest.  The new lesion so-called is very hazy and very small, and fifth of an inch.  I feel pretty comfortable that this is inflammatory.  I do not expect to see it there when we repeat a CT of the chest in about 3 months.  She is having some bony aches and pains which are really more muscular than bony.  I do not see any bone lesions on review of the  recent CT scan.  I think the Crestor may be at fault and I asked her to stop that medication for a couple of weeks and let us know if those aches and pains get better  What does bother me is the steady rise in the Ca1 25.  We may be missing something with a repeat CT scans only.  Accordingly I am setting her up for a PET CT scan to be done in about a month.  I will call her with those results.  Otherwise I will see her again in June, with lab work a few days before that.  She knows to call for any other issue that may develop before the next visit here.   Virgie Dad. Lizzete Gough, MD Medical Oncology and Hematology [facility] [address] [place], [place] [address] Tel. [phone]    Fax. [phone]   I, Wilburn Mylar, am acting as scribe for Dr. Virgie Dad. Zaylyn Bergdoll.  Lindie Spruce MD, have reviewed the above documentation for accuracy  and completeness, and I agree with the above.

## 2018-11-25 NOTE — Research (Signed)
11/25/2018 at 3:34pm - [employer]/Bolero 4 extension phase visit #14 study notes Thepatientwasintotheclinicthis afternoonunaccompanied forherroutine 12-week follow-up visit and study drug dispensing. Patient returned 6 kits of everolimus with8tablets returned. The pt's overall 12 week study drug compliance is 95.2%for the date range of2/12/2018-11/25/2018. Patient returned paper calendarsdocumenting daily doses of letrozole2.5 mgand everolimus10 mgwith4 missed doses on the following dates: 09/30/18, 10/14/18, 11/03/18 and 11/19/18.  Medication boxeswerereturned to thepharmacy for drug accountability by C[employer].The pt said that she is feeling well today, but she has some new "muscle aches".  The pt said that the "muscle aches" began "5 weeks ago" (~10/21/18).  The pt said that they are mainly in her upper shoulders and sometimes in her lower extremities.  The pt describes the pain as moderate- grade 2 Myalgia.  Dr. Jana Hakim felt that the muscle pain could be related to her Crestor.  He immediately stopped this medication.  He did not feel that her new onset muscle pain was related to her study drugs: letrozole or everolimus.  The pt reports working in her yard and garden on most days. ECOG=1 The pt denies any new medication changes.  Patient reports the previously noted AE's (fatigue,hot flashes, dry skin, brittle nails, back pain, and memory impairment)as ongoingand unchanged in severity. Patient reports her blurry vision in her left eye is still ongoing.She has not yet scheduled an appointment with her ophthalmologist for additional follow up.The pt has had intentional, ongoingweight loss because she is managing her diabetes and eating fewer carbs and more vegetables. The pt's weight has been stable since January per the pt.  Dr. Jana Hakim reviewed the pt's lab values and her most recent CT chest from 11/19/18.  Dr. Jana Hakim said that her tumor marker has been trending  upward which he feels that she may need some additional imaging to rule out any evidence of progression.  He informed the pt that there is a new (hazy) right upper lobe nodule which is only 54mm.  He informed the pt that her other areas of concern remain stable.  Dr. Jana Hakim said that this new "nodule" could be inflammatory, and it may be back to normal at her next scan.  Based on lab results review, scan review, and history and physical byDr. Magrinat,thepatient'scondition isacceptable for continued treatment. Dr. Jana Hakim stated the pt's hematological lab abnormalities were "not clinically significant".Dr. Ottis Stain the pt. He feels thatshe has nodefinitive evidence of disease recurrence at this point.In hisopinion,she is still receiving benefit from her study treatment.Dr. Jana Hakim said that he wanted a PET scan in 4 weeks to further evaluate her status prior to her next appt in July 2020.Six kits of study drug everolimus were dispensed today for the next 12 weeks supply. In addition, patient will continue commercial supply of letrozole 2.5mg  daily. The next study visit is expected to occur on7/22/2020. The research nurse thankedthepatient for hercontinuedparticipationin the study.   Adverse Event Log Protocol: [employer] [id number] Cycle: Extension Phase #13(AE date range2/7/20-4/29/20) Ongoing and unchanged in severity:fatigue, hot flashes (mild), memory impairment, dry skin,blurred vision,back pain and osteopenia "Not clinically significant" abnormal labs not required by the study to be reported as AE's are the following: alkaline phosphatase, creatinine increased, proteinuria (urine), BUN, carbon dioxide, total protein, and total bilirubin (no interventions required for the above abnormal lab values) Event Grade Onset Date Resolved Date Attribution to letrozole# Attribution to everolimus#  Hyperglycemia  1 03/17/18 11/19/2018 No-Not Suspected Yes-Suspected   Hypertriglyceridemia 1 08/31/2018 Ongoing No- Not Suspected Yes-Suspected  Cholesterol High 1 08/31/2018 Ongoing  Yes-Suspected Yes-Suspected  Nail changes (brittle) 1 06/11/18 Ongoing Yes-Suspected No-Not Suspected  Weight Loss 3 06/11/18 Ongoing No-Not Suspected No-Not Suspected  Myalgia  2 ~10/21/2018 Ongoing  No-Not Suspected  No-Not Suspected   Brion Aliment RN, BSN, CCRP Clinical Research Nurse 11/25/2018 4:52 PM

## 2018-11-26 ENCOUNTER — Telehealth: Payer: Self-pay | Admitting: Oncology

## 2018-11-26 NOTE — Telephone Encounter (Signed)
Tried to reach regarding schedule °

## 2018-12-12 ENCOUNTER — Encounter (HOSPITAL_COMMUNITY): Payer: Self-pay | Admitting: *Deleted

## 2018-12-12 ENCOUNTER — Other Ambulatory Visit: Payer: Self-pay

## 2018-12-12 ENCOUNTER — Telehealth: Payer: Self-pay | Admitting: *Deleted

## 2018-12-12 ENCOUNTER — Emergency Department (HOSPITAL_COMMUNITY)
Admission: EM | Admit: 2018-12-12 | Discharge: 2018-12-12 | Disposition: A | Discharge status: Home / Self Care | Payer: Medicare Other | Attending: Emergency Medicine | Admitting: Emergency Medicine

## 2018-12-12 ENCOUNTER — Emergency Department (HOSPITAL_COMMUNITY): Payer: Medicare Other

## 2018-12-12 DIAGNOSIS — J45909 Unspecified asthma, uncomplicated: Secondary | ICD-10-CM | POA: Insufficient documentation

## 2018-12-12 DIAGNOSIS — R0789 Other chest pain: Secondary | ICD-10-CM | POA: Insufficient documentation

## 2018-12-12 DIAGNOSIS — R000 Tachycardia, unspecified: Secondary | ICD-10-CM | POA: Diagnosis not present

## 2018-12-12 DIAGNOSIS — Z853 Personal history of malignant neoplasm of breast: Secondary | ICD-10-CM | POA: Insufficient documentation

## 2018-12-12 DIAGNOSIS — E119 Type 2 diabetes mellitus without complications: Secondary | ICD-10-CM | POA: Insufficient documentation

## 2018-12-12 DIAGNOSIS — Z87891 Personal history of nicotine dependence: Secondary | ICD-10-CM | POA: Insufficient documentation

## 2018-12-12 DIAGNOSIS — M545 Low back pain: Secondary | ICD-10-CM | POA: Diagnosis present

## 2018-12-12 DIAGNOSIS — R0781 Pleurodynia: Secondary | ICD-10-CM | POA: Diagnosis not present

## 2018-12-12 DIAGNOSIS — Z79899 Other long term (current) drug therapy: Secondary | ICD-10-CM | POA: Insufficient documentation

## 2018-12-12 DIAGNOSIS — Z91040 Latex allergy status: Secondary | ICD-10-CM | POA: Insufficient documentation

## 2018-12-12 DIAGNOSIS — Z96642 Presence of left artificial hip joint: Secondary | ICD-10-CM | POA: Insufficient documentation

## 2018-12-12 DIAGNOSIS — R079 Chest pain, unspecified: Secondary | ICD-10-CM | POA: Diagnosis not present

## 2018-12-12 DIAGNOSIS — M546 Pain in thoracic spine: Secondary | ICD-10-CM | POA: Diagnosis not present

## 2018-12-12 LAB — CBC WITH DIFFERENTIAL/PLATELET
Abs Immature Granulocytes: 0.02 10*3/uL (ref 0.00–0.07)
Basophils Absolute: 0 10*3/uL (ref 0.0–0.1)
Basophils Relative: 0 %
Eosinophils Absolute: 0 10*3/uL (ref 0.0–0.5)
Eosinophils Relative: 1 %
HCT: 42.4 % (ref 36.0–46.0)
Hemoglobin: 13.7 g/dL (ref 12.0–15.0)
Immature Granulocytes: 0 %
Lymphocytes Relative: 13 %
Lymphs Abs: 1 10*3/uL (ref 0.7–4.0)
MCH: 26.3 pg (ref 26.0–34.0)
MCHC: 32.3 g/dL (ref 30.0–36.0)
MCV: 81.5 fL (ref 80.0–100.0)
Monocytes Absolute: 0.9 10*3/uL (ref 0.1–1.0)
Monocytes Relative: 12 %
Neutro Abs: 5.5 10*3/uL (ref 1.7–7.7)
Neutrophils Relative %: 74 %
Platelets: 291 10*3/uL (ref 150–400)
RBC: 5.2 MIL/uL — ABNORMAL HIGH (ref 3.87–5.11)
RDW: 13.9 % (ref 11.5–15.5)
WBC: 7.4 10*3/uL (ref 4.0–10.5)
nRBC: 0 % (ref 0.0–0.2)

## 2018-12-12 LAB — COMPREHENSIVE METABOLIC PANEL
ALT: 16 U/L (ref 0–44)
AST: 24 U/L (ref 15–41)
Albumin: 3.8 g/dL (ref 3.5–5.0)
Alkaline Phosphatase: 173 U/L — ABNORMAL HIGH (ref 38–126)
Anion gap: 12 (ref 5–15)
BUN: 35 mg/dL — ABNORMAL HIGH (ref 8–23)
CO2: 20 mmol/L — ABNORMAL LOW (ref 22–32)
Calcium: 9.9 mg/dL (ref 8.9–10.3)
Chloride: 107 mmol/L (ref 98–111)
Creatinine, Ser: 1 mg/dL (ref 0.44–1.00)
GFR calc Af Amer: >60 mL/min (ref >60)
GFR calc non Af Amer: 58 mL/min — ABNORMAL LOW (ref >60)
Glucose, Bld: 118 mg/dL — ABNORMAL HIGH (ref 70–99)
Potassium: 4.3 mmol/L (ref 3.5–5.1)
Sodium: 139 mmol/L (ref 135–145)
Total Bilirubin: 0.4 mg/dL (ref 0.3–1.2)
Total Protein: 8.1 g/dL (ref 6.5–8.1)

## 2018-12-12 MED ORDER — IOHEXOL 350 MG/ML SOLN
75.0000 mL | Freq: Once | INTRAVENOUS | Status: AC | PRN
  Administered 2018-12-12: 75 mL via INTRAVENOUS

## 2018-12-12 MED ORDER — CYCLOBENZAPRINE HCL 10 MG PO TABS: 10.0000 mg | ORAL_TABLET | Freq: Two times a day (BID) | ORAL | 0 refills | Status: DC | PRN

## 2018-12-12 MED ORDER — FENTANYL CITRATE (PF) 100 MCG/2ML IJ SOLN: 50.0000 ug | INTRAMUSCULAR | Status: DC | PRN

## 2018-12-12 NOTE — ED Triage Notes (Signed)
Pt with mid right back for a week, pt is a cancer patient for right lung, hx of right breast ca.  Pt on oral chemo daily.  Pt states she is unable to take deep breaths due to pain.

## 2018-12-12 NOTE — ED Provider Notes (Signed)
[facility] EMERGENCY DEPARTMENT Provider Note   CSN: [id number] Arrival date & time: 12/12/18  1336    History   Chief Complaint Chief Complaint  Patient presents with  . Back Pain    HPI Barbara Parks is a 68 y.o. female.     HPI  The patient is a very pleasant 67 year old female with a known history of metastatic breast cancer, she has known history of metastatic disease to her lungs, she is currently and has been for 7 years on an oral chemotherapy agent, she is followed very closely by the cancer clinics with Dr. Jana Hakim and reports that she last had a CT scan 2 weeks ago.  She states that over the last week she has had a progressive pain in her right low mid back and her right lateral chest which is worse with movement, worse with palpation and deep breathing and not associated with coughing or shortness of breath.  She denies fevers, denies swelling of the legs, denies history of pulmonary embolism.  She has not had any rash to her chest wall and denies any injuries.  This pain started out very mild and has gradually progressed to become more persistent and more intense.  Last night she had an acute episode of what she felt was a muscle spasm that was so intense that it almost made her vomit.  At this time the symptoms are more mild.  Past Medical History:  Diagnosis Date  . Arthritis    left hip  . Asthma   . breast ca 2005   breast/chemo R mastectomy  . Dermatitis   . Diabetes mellitus without complication ([place])   . GERD (gastroesophageal reflux disease)   . Hypercholesteremia   . Metastasis to lung ([place]) dx'd 08/2011  . Osteopenia due to cancer therapy 09/09/2013  . Palpitations   . Personal history of chemotherapy   . Shortness of breath     Patient Active Problem List   Diagnosis Date Noted  . Hyperglycemia 10/02/2017  . Hepatic steatosis 01/23/2017  . OA (osteoarthritis) of hip 06/07/2015  . Elevated cholesterol with high triglycerides 03/22/2015  .  Malignant neoplasm of upper-outer quadrant of right breast in female, estrogen receptor positive ([place]) 08/13/2014  . Thoracic aorta atherosclerosis ([place]) 08/13/2014  . Proteinuria 06/16/2014  . Hypercalcemia 06/16/2014  . Fatigue 04/21/2014  . ADHD (attention deficit hyperactivity disorder)   . Osteopenia due to cancer therapy 09/09/2013  . Carcinoma of right breast metastatic to lung ([place]) 10/10/2011  . Malignant pericardial effusion ([place]) 09/10/2011  . Pleural effusion 09/10/2011  . Pneumonia 08/26/2011  . Asthma in adult without complication 93/81/0175  . Osteoarthritis of left hip 08/17/2010    Past Surgical History:  Procedure Laterality Date  . BREAST SURGERY  2005   right  . CATARACT EXTRACTION W/PHACO Left 01/18/2014   Procedure: CATARACT EXTRACTION PHACO AND INTRAOCULAR LENS PLACEMENT (IOC);  Surgeon: Elta Guadeloupe T. Gershon Crane, MD;  Location: [facility];  Service: Ophthalmology;  Laterality: Left;  CDE 15.79  . CATARACT EXTRACTION W/PHACO Right 02/08/2014   Procedure: CATARACT EXTRACTION PHACO AND INTRAOCULAR LENS PLACEMENT (IOC);  Surgeon: Elta Guadeloupe T. Gershon Crane, MD;  Location: [facility];  Service: Ophthalmology;  Laterality: Right;  CDE 4.41  . CHEST TUBE INSERTION  09/11/2011   Procedure: INSERTION PLEURAL DRAINAGE CATHETER;  Surgeon: Gaye Pollack, MD;  Location: [place];  Service: Thoracic;  Laterality: Right;  . MASTECTOMY Right   . PERICARDIAL WINDOW  09/11/2011   Procedure: PERICARDIAL WINDOW;  Surgeon: Gaye Pollack, MD;  Location: [facility] OR;  Service: Thoracic;  Laterality: N/A;  . REMOVAL OF PLEURAL DRAINAGE CATHETER  12/19/2011   Procedure: REMOVAL OF PLEURAL DRAINAGE CATHETER;  Surgeon: Gaye Pollack, MD;  Location: [facility];  Service: Thoracic;  Laterality: Right;  TO BE DONE IN MINOR ROOM, SHORT STAY  . [place]  . TOTAL HIP ARTHROPLASTY Left 06/07/2015   Procedure: LEFT TOTAL HIP ARTHROPLASTY ANTERIOR APPROACH;  Surgeon: Gaynelle Arabian, MD;  Location: [facility];  Service: Orthopedics;   Laterality: Left;  Marland Kitchen VIDEO BRONCHOSCOPY  09/11/2011   Procedure: VIDEO BRONCHOSCOPY;  Surgeon: Gaye Pollack, MD;  Location: [facility] OR;  Service: Thoracic;  Laterality: N/A;     OB History   No obstetric history on file.      Home Medications    Prior to Admission medications   Medication Sig Start Date End Date Taking? Authorizing Provider  blood glucose meter kit and supplies KIT Dispense based on patient and insurance preference. Use up to four times daily as directed. (FOR ICD-9 250.00, 250.01). 11/25/17  Yes Tanner, Lyndon Code., PA-C  cholecalciferol (VITAMIN D3) 25 MCG (1000 UT) tablet Take 2 tablets (2,000 Units total) by mouth daily. 09/03/18   Magrinat, Virgie Dad, MD  cyclobenzaprine (FLEXERIL) 10 MG tablet Take 1 tablet (10 mg total) by mouth 2 (two) times daily as needed for muscle spasms. 12/12/18   Noemi Chapel, MD  gemfibrozil (LOPID) 600 MG tablet Take 1 tablet (600 mg total) by mouth 2 (two) times daily before a meal. 08/03/18   Magrinat, Virgie Dad, MD  Investigational everolimus (RAD001) 5 MG tablet Novartis [id number] Take 2 tablets by mouth daily. Take with a glass of water. 11/25/18   Magrinat, Virgie Dad, MD  letrozole [facility]) 2.5 MG tablet Take 1 tablet (2.5 mg total) by mouth daily. 06/11/18   Magrinat, Virgie Dad, MD  loperamide (IMODIUM) 2 MG capsule Take 2 mg by mouth 4 (four) times daily as needed for diarrhea or loose stools. Reported on 11/30/2015    [provider]  metFORMIN (GLUCOPHAGE-XR) 500 MG 24 hr tablet Take 1 tablet (500 mg total) by mouth 3 (three) times daily before meals. 06/11/18   Magrinat, Virgie Dad, MD    Family History Family History  Problem Relation Age of Onset  . Cancer Mother        breast  . Heart disease Father   . Diabetes Other   . Anesthesia problems Neg Hx     Social History Social History   Tobacco Use  . Smoking status: Former Smoker    Packs/day: 1.50    Years: 30.00    Pack years: 45.00    Types: Cigarettes    Last attempt  to quit: 09/10/2007    Years since quitting: 11.2  . Smokeless tobacco: Never Used  Substance Use Topics  . Alcohol use: No  . Drug use: No     Allergies   Aspirin; Latex; and Nsaids   Review of Systems Review of Systems  All other systems reviewed and are negative.    Physical Exam Updated Vital Signs BP 119/74   Pulse 89   Temp 98 F (36.7 C) (Oral)   Resp 17   Ht 1.638 m (5' 4.5")   Wt 56.2 kg   SpO2 98%   BMI 20.96 kg/m   Physical Exam Vitals signs and nursing note reviewed.  Constitutional:      General: She is not in acute  distress.    Appearance: She is well-developed.  HENT:     Head: Normocephalic and atraumatic.     Mouth/Throat:     Pharynx: No oropharyngeal exudate.  Eyes:     General: No scleral icterus.       Right eye: No discharge.        Left eye: No discharge.     Conjunctiva/sclera: Conjunctivae normal.     Pupils: Pupils are equal, round, and reactive to light.  Neck:     Musculoskeletal: Normal range of motion and neck supple.     Thyroid: No thyromegaly.     Vascular: No JVD.  Cardiovascular:     Rate and Rhythm: Regular rhythm. Tachycardia present.     Heart sounds: Normal heart sounds. No murmur. No friction rub. No gallop.      Comments: Pulse of 105, sinus tachycardia Pulmonary:     Effort: Pulmonary effort is normal. No respiratory distress.     Breath sounds: Normal breath sounds. No wheezing or rales.     Comments: There is tenderness over the right chest wall over the lower lateral anterior and posterior ribs, there is no crepitance or subcutaneous emphysema and no rash overlying this area Chest:     Chest wall: Tenderness present.  Abdominal:     General: Bowel sounds are normal. There is no distension.     Palpations: Abdomen is soft. There is no mass.     Tenderness: There is no abdominal tenderness.  Musculoskeletal: Normal range of motion.        General: No tenderness.     Comments: There is no tenderness over the  spinous processes of the cervical thoracic and lumbar spine, there is no paraspinal muscle tenderness either.  Lymphadenopathy:     Cervical: No cervical adenopathy.  Skin:    General: Skin is warm and dry.     Findings: No erythema or rash.  Neurological:     Mental Status: She is alert.     Coordination: Coordination normal.  Psychiatric:        Behavior: Behavior normal.      ED Treatments / Results  Labs (all labs ordered are listed, but only abnormal results are displayed) Labs Reviewed  CBC WITH DIFFERENTIAL/PLATELET - Abnormal; Notable for the following components:      Result Value   RBC 5.20 (*)    All other components within normal limits  COMPREHENSIVE METABOLIC PANEL - Abnormal; Notable for the following components:   CO2 20 (*)    Glucose, Bld 118 (*)    BUN 35 (*)    Alkaline Phosphatase 173 (*)    GFR calc non Af Amer 58 (*)    All other components within normal limits    EKG EKG Interpretation  Date/Time:  Saturday Dec 12 2018 13:51:51 EDT Ventricular Rate:  102 PR Interval:    QRS Duration: 85 QT Interval:  339 QTC Calculation: 442 R Axis:   80 Text Interpretation:  Sinus tachycardia Probable left atrial enlargement Baseline wander in lead(s) I III aVR aVL Since last tracing rate faster Confirmed by Noemi Chapel [phone]) on 12/12/2018 2:05:27 PM   Radiology Dg Ribs Unilateral W/chest Right  Result Date: 12/12/2018 CLINICAL DATA:  Right mid back pain for the past week. History of metastatic breast cancer. EXAM: RIGHT RIBS AND CHEST - 3+ VIEW COMPARISON:  Chest CT - 11/19/2018 FINDINGS: Unchanged cardiac silhouette and mediastinal contours. There is mild diffuse slightly nodular thickening of the pulmonary  interstitium. No discrete focal airspace opacities. No pleural effusion or pneumothorax. No evidence of edema. No acute osseous abnormalities. Specifically, no definite displaced right-sided rib fractures with special attention paid to the area  demarcated by the radiopaque BB. Regional soft tissues appear normal. No radiopaque foreign body. Post right-sided mastectomy and right-sided axillary adenectomy. Stigmata of DISH within the thoracic spine. Degenerative change of the right AC joint is suspected though incompletely evaluated. IMPRESSION: No explanation for patient's right mid and back pain. Specifically, no definite displaced right-sided rib fractures. Given history of malignancy, as well as retrospective review of chest CT performed 11/19/2018 in which I question an approximately 1.2 x 0.8 cm lytic lesion involving the posterior aspect of the right tenth rib (image 105, series 7), further evaluation could be performed with the acquisition of a nuclear medicine bone scan and/or PET scan as indicated. Electronically Signed   By: Sandi Mariscal M.D.   On: 12/12/2018 15:01   Ct Angio Chest Pe W And/or Wo Contrast  Result Date: 12/12/2018 CLINICAL DATA:  Chest pain, history of right lung cancer, right breast cancer EXAM: CT ANGIOGRAPHY CHEST WITH CONTRAST TECHNIQUE: Multidetector CT imaging of the chest was performed using the standard protocol during bolus administration of intravenous contrast. Multiplanar CT image reconstructions and MIPs were obtained to evaluate the vascular anatomy. CONTRAST:  24m OMNIPAQUE IOHEXOL 350 MG/ML SOLN COMPARISON:  11/19/2018 FINDINGS: Cardiovascular: Satisfactory opacification of the pulmonary arteries to the segmental level. No evidence of pulmonary embolism. Normal heart size. No pericardial effusion. Mediastinum/Nodes: No enlarged mediastinal, hilar, or axillary lymph nodes. Thyroid gland, trachea, and esophagus demonstrate no significant findings. Lungs/Pleura: No change in infrahilar soft tissue of the right lung and pulmonary nodules in the right lung base (series 6, image 120, 91). Numerous tiny, centrilobular pulmonary nodules unchanged from prior examination. Mild emphysema. No pleural effusion or  pneumothorax. Upper Abdomen: No acute abnormality.  Stable left adrenal nodule. Musculoskeletal: Status post right mastectomy. No acute or significant osseous findings. Review of the MIP images confirms the above findings. IMPRESSION: 1.  Negative examination for pulmonary embolism. 2.  Unchanged post treatment appearance of right lung malignancy. 3. Numerous tiny, centrilobular pulmonary nodules unchanged from prior examination, nonspecific and likely infectious or inflammatory. 4.  Mild emphysema. 5.  Stable left adrenal nodule. Electronically Signed   By: AEddie CandleM.D.   On: 12/12/2018 16:43    Procedures Procedures (including critical care time)  Medications Ordered in ED Medications  fentaNYL (SUBLIMAZE) injection 50 mcg (has no administration in time range)  iohexol (OMNIPAQUE) 350 MG/ML injection 75 mL (75 mLs Intravenous Contrast Given 12/12/18 1608)     Initial Impression / Assessment and Plan / ED Course  I have reviewed the triage vital signs and the nursing notes.  Pertinent labs & imaging results that were available during my care of the patient were reviewed by me and considered in my medical decision making (see chart for details).        The patient appears to have significant pain with any movement, any deep breathing or any palpation over the right side of the chest.  The question becomes whether this is chest wall pain, underlying pulmonary embolism, underlying malignant pleural effusion (this seems less likely given normal lung sounds), however given her history of metastatic cancer she will need to have further evaluation initially with rib x-rays to make sure there is no fracture and if negative a CT angiogram of the chest.  The patient is agreeable.  Vital signs are otherwise stable without fever or hypoxia.  Vital signs have normalized, lab work unremarkable, CT scan reviewed with the radiologist, there is no signs of acute findings that would cause the patient's  pain, this does seem to be very reproducible with movement and palpation over the ribs and the chest wall, will add a muscle relaxant since she seems to have some type of spasticity of that muscle.  Final Clinical Impressions(s) / ED Diagnoses   Final diagnoses:  Rib pain on right side    ED Discharge Orders         Ordered    cyclobenzaprine (FLEXERIL) 10 MG tablet  2 times daily PRN     12/12/18 1705           Noemi Chapel, MD 12/12/18 1706

## 2018-12-12 NOTE — Telephone Encounter (Signed)
12/12/2018 at 11am - The pt left the research nurse a message on 12/11/2018 regarding some new back pain.  The research nurse called the pt back this morning to follow up with the pt.  The pt was encouraged to always contact Dr. Jana Hakim or his nurse for any new or worsening symptoms.  The pt verbalized understanding.  The pt said that she has back pain that is in the "middle of her back".  The pt said that she doesn't know if she hurt her back playing golf.  The pt was instructed to contact the on-call physician to discuss her back pain.  The pt was given the number to call.  The pt said that she will call the on-call physician if her pain worsens.  She said that she thinks she will wait and call Dr. Jana Hakim on Monday.  The research nurse will follow up with the pt on Monday to check on her status.  The pt thanked the nurse for calling her today. Brion Aliment RN, BSN, CCRP Clinical Research Nurse 12/12/2018 11:08 AM

## 2018-12-12 NOTE — Discharge Instructions (Signed)
Your CT scan has not changed since your last one a couple of weeks ago.  There is no blood clots, no pneumonia, no injuries to your ribs or tumors in the ribs that are of concern.  Please follow-up with your doctor in the next week if you continue to have pain however you may benefit from taking Flexeril twice a day.  This is a muscle relaxer which may cause some drowsiness so please do not drive while taking it.  Emergency department for severe or worsening symptoms.

## 2018-12-14 ENCOUNTER — Other Ambulatory Visit: Payer: Self-pay | Admitting: Oncology

## 2018-12-14 ENCOUNTER — Telehealth: Payer: Self-pay | Admitting: *Deleted

## 2018-12-14 NOTE — Telephone Encounter (Signed)
This RN received faxed communication from [facility] of pt's call over the weekend with advice to proceed to the ER.  Noted ER visit for severe pain with deep breath with negative work up for PE or lung issues.  Pt discharged with flexeril.  Pt is scheduled for PET later this month and MD follow up in June.  This RN attempted to contact the patient and obtained VM- message left requesting a return call for update on her status.

## 2018-12-18 DIAGNOSIS — H9193 Unspecified hearing loss, bilateral: Secondary | ICD-10-CM | POA: Diagnosis not present

## 2018-12-18 DIAGNOSIS — G722 Myopathy due to other toxic agents: Secondary | ICD-10-CM | POA: Diagnosis not present

## 2018-12-18 DIAGNOSIS — J449 Chronic obstructive pulmonary disease, unspecified: Secondary | ICD-10-CM | POA: Diagnosis not present

## 2018-12-18 DIAGNOSIS — I700 Atherosclerosis of aorta: Secondary | ICD-10-CM | POA: Diagnosis not present

## 2018-12-18 DIAGNOSIS — E785 Hyperlipidemia, unspecified: Secondary | ICD-10-CM | POA: Diagnosis not present

## 2018-12-18 DIAGNOSIS — C7951 Secondary malignant neoplasm of bone: Secondary | ICD-10-CM | POA: Diagnosis not present

## 2018-12-18 DIAGNOSIS — E1165 Type 2 diabetes mellitus with hyperglycemia: Secondary | ICD-10-CM | POA: Diagnosis not present

## 2018-12-22 ENCOUNTER — Ambulatory Visit (HOSPITAL_COMMUNITY)
Admission: RE | Admit: 2018-12-22 | Discharge: 2018-12-22 | Disposition: A | Discharge status: Home / Self Care | Payer: Medicare Other | Source: Ambulatory Visit | Attending: Oncology | Admitting: Oncology

## 2018-12-22 ENCOUNTER — Other Ambulatory Visit: Payer: Self-pay

## 2018-12-22 DIAGNOSIS — D3500 Benign neoplasm of unspecified adrenal gland: Secondary | ICD-10-CM | POA: Diagnosis not present

## 2018-12-22 DIAGNOSIS — Z7984 Long term (current) use of oral hypoglycemic drugs: Secondary | ICD-10-CM | POA: Diagnosis not present

## 2018-12-22 DIAGNOSIS — C50911 Malignant neoplasm of unspecified site of right female breast: Secondary | ICD-10-CM | POA: Diagnosis not present

## 2018-12-22 DIAGNOSIS — C801 Malignant (primary) neoplasm, unspecified: Secondary | ICD-10-CM | POA: Diagnosis not present

## 2018-12-22 DIAGNOSIS — E119 Type 2 diabetes mellitus without complications: Secondary | ICD-10-CM | POA: Insufficient documentation

## 2018-12-22 DIAGNOSIS — C7800 Secondary malignant neoplasm of unspecified lung: Secondary | ICD-10-CM | POA: Diagnosis not present

## 2018-12-22 DIAGNOSIS — C7951 Secondary malignant neoplasm of bone: Secondary | ICD-10-CM | POA: Diagnosis not present

## 2018-12-22 DIAGNOSIS — I313 Pericardial effusion (noninflammatory): Secondary | ICD-10-CM | POA: Diagnosis not present

## 2018-12-22 DIAGNOSIS — Z87891 Personal history of nicotine dependence: Secondary | ICD-10-CM | POA: Diagnosis not present

## 2018-12-22 DIAGNOSIS — Z170 Estrogen receptor positive status [ER+]: Secondary | ICD-10-CM | POA: Insufficient documentation

## 2018-12-22 DIAGNOSIS — Z79899 Other long term (current) drug therapy: Secondary | ICD-10-CM | POA: Insufficient documentation

## 2018-12-22 DIAGNOSIS — I3131 Malignant pericardial effusion in diseases classified elsewhere: Secondary | ICD-10-CM

## 2018-12-22 DIAGNOSIS — C50411 Malignant neoplasm of upper-outer quadrant of right female breast: Secondary | ICD-10-CM | POA: Diagnosis not present

## 2018-12-22 DIAGNOSIS — D3502 Benign neoplasm of left adrenal gland: Secondary | ICD-10-CM | POA: Insufficient documentation

## 2018-12-22 LAB — GLUCOSE, CAPILLARY: Glucose-Capillary: 111 mg/dL — ABNORMAL HIGH (ref 70–99)

## 2018-12-22 MED ORDER — FLUDEOXYGLUCOSE F - 18 (FDG) INJECTION
6.1900 | Freq: Once | INTRAVENOUS | Status: AC | PRN
  Administered 2018-12-22: 6.19 via INTRAVENOUS

## 2018-12-23 ENCOUNTER — Other Ambulatory Visit: Payer: Self-pay | Admitting: Oncology

## 2018-12-23 DIAGNOSIS — Z7189 Other specified counseling: Secondary | ICD-10-CM

## 2018-12-23 DIAGNOSIS — C7951 Secondary malignant neoplasm of bone: Secondary | ICD-10-CM | POA: Insufficient documentation

## 2018-12-23 NOTE — Progress Notes (Signed)
I called Barbara Parks and left her message to call us back.  I let her know that there are some new bone spots in her PET scan.  I think what I will probably would like to do is start her on denosumab/Xgeva, give her 2 doses, obtain a bone scan, follow the CA-27-29, and then consider repeating a bone scan or PET scan 3 months after that, which would be 5 months from now, but not necessarily change her systemic treatment yet.  We will discuss that when she calls [place] back and I am unable to call her back.

## 2018-12-25 ENCOUNTER — Telehealth: Payer: Self-pay | Admitting: Pharmacist

## 2018-12-25 ENCOUNTER — Telehealth: Payer: Self-pay | Admitting: Oncology

## 2018-12-25 DIAGNOSIS — C50411 Malignant neoplasm of upper-outer quadrant of right female breast: Secondary | ICD-10-CM

## 2018-12-25 DIAGNOSIS — E1165 Type 2 diabetes mellitus with hyperglycemia: Secondary | ICD-10-CM | POA: Diagnosis not present

## 2018-12-25 DIAGNOSIS — E785 Hyperlipidemia, unspecified: Secondary | ICD-10-CM | POA: Diagnosis not present

## 2018-12-25 MED ORDER — EVEROLIMUS 10 MG PO TABS: 10.0000 mg | ORAL_TABLET | Freq: Every day | ORAL | 6 refills | Status: DC

## 2018-12-25 NOTE — Telephone Encounter (Signed)
Oral Oncology Pharmacist Encounter  Received new referral for Afinitor (everolimus) for the continued treatment of metastatic, hormone receptor positive breast cancer in conjunction with letrozole, planned duration until disease progression or unacceptable toxicity.  Original diagnosis in 1995, s/p mastectomy and endocrine therapy with tamoxifen Recurrence was noted on 2005 and 2006 and patient was lost to follow-up She presented back to the cancer center in Feb 2013 and was noted with metastatic disease at that time. Patient was then enrolled into a research study in March of 2013 to receive everolimus + letrozole and has been maintained on this therapy since that time.  The study is now being closed and oral oncology clinic has been asked to assist with acquisition of commercial product.   Patient is taking everolimus 10mg  once daily and letrozole 2.5mg  once daily.  Labs from 12/12/18 assessed, OK for continued treatment.  Current medication list in Epic reviewed, no significant DDIs with everolimus identified.  Prescription has been e-scribed to the [facility] for benefits analysis and approval.  Oral Oncology Clinic will continue to follow for insurance authorization, copayment issues, follow-up counseling and date of transition to commercial product.  Johny Drilling, PharmD, BCPS, BCOP  12/25/2018 8:43 AM Oral Oncology Clinic [phone]

## 2018-12-25 NOTE — Telephone Encounter (Signed)
Added injection to 6/8 lab/fu per 5/27 schedule message. Lab/fu 6/11 was subsequently cancelled and moved to 6/8. Left message confirming 6/8 appointments.

## 2018-12-25 NOTE — Telephone Encounter (Signed)
Tried to reach regarding reschedule °

## 2018-12-28 ENCOUNTER — Telehealth: Payer: Self-pay

## 2018-12-28 NOTE — Telephone Encounter (Signed)
Oral Oncology Patient Advocate Encounter  During my benefit investigation I found that the patient does not prescription insurance.  If she does not have prescription insurance I can help her get signed up for manufacturer assistance and will need some information from her and her verbal ok to do that.  I called the patient to confirm that she has no prescription insurance and talk about manufacturer assistance, I had to leave a voicemail.  [facility] Phone [phone] Fax [phone] 12/28/2018   11:21 AM

## 2018-12-28 NOTE — Telephone Encounter (Signed)
Oral Oncology Patient Advocate Encounter  I spoke to the patient and she does not have prescription insurance.   We discussed [employer] assistance and she agreed to apply for assistance. I filled the application out for her and she gave me the required income information, her portion was able to be submitted online.   This process could take a week or so and I will keep the patient updated. I will notify the patient of the final decision and more information on how she will receive the medicine and get her refills once she is approved for the program.  I faxed the completed application today.  The patient verbalized understanding and great appreciation.  This encounter will be updated until final determination.  [place] Patient [place] Phone [phone] Fax [phone] 12/28/2018   1:26 PM

## 2018-12-29 NOTE — Telephone Encounter (Signed)
Oral Chemotherapy Pharmacist Encounter   I spoke with patient for overview of: Afinitor (everolimus) which is continuing for the treatment of metastatic, hormone receptor positive breast cancer in conjunction with letrozole, planned duration until disease progression or unacceptable toxicity.  Original start date: late March 2013 Patient has been receiving Afinitor through clinical trial, which is now closing.  Counseled patient on administration, dosing, side effects, monitoring, drug-food interactions, safe handling, storage, and disposal.  Patient will take Afinitor 10mg  tablets, 1 tablet by mouth once daily, with water, without regard to food.  Patient understands to take Afinitor consistently with regards to food and at approximately the same time each day. She currently takes her research study Afinitor 5mg  tablets, 2 tablets (10 mg) by mouth once daily after lunch.  Patient knows to aviod grapefruit or grapefruit juice while on therapy with Afinitor.  Patient continues on letrozole 2.5mg  once daily.  Adverse effects reviewed and include but are not limited to: mouth sores, GI upset, nausea, diarrhea, constipation, rash, increased blood sugars, decreased blood counts, increased blood pressure, pulmonary toxicity, and edema.   Patient has experienced increase in blood glucose while on Afinitor and is on metformin. We continue to monitor lipid profile.  Dexamethasone mouthwash prescription will not be used as this is not a new start.  Reviewed with patient importance of keeping a medication schedule and plan for any missed doses.  Medication reconciliation performed and medication/allergy list updated.  Patient does not have prescription medication coverage. She pays for her letrozole at a local pharmacy. She has been approved to receive Afinitor through [employer] compassionate use program. From 12/29/2018-12/29/2019 Research department has been notified of enrollment.  Once  compassionate use supply is received by patient she will be set up to return remaining study drug and come off of clinical trial.  All questions answered.  Ms. Losey voiced understanding and appreciation.   Patient knows to call the office with questions or concerns.  Johny Drilling, PharmD, BCPS, BCOP  12/29/2018 3:14 PM Oral Oncology Clinic [phone]

## 2018-12-29 NOTE — Telephone Encounter (Signed)
Oral Oncology Patient Advocate Encounter  Received notification from [employer] Patient Assistance program that patient has been successfully enrolled into their program to receive Afinitor from the manufacturer at $0 out of pocket until 12/29/19.    I called and spoke with patient.  She knows we will have to re-apply.   Patient knows to call the office with questions or concerns.   Oral Oncology Clinic will continue to follow.  [place] Patient [place] Phone [phone] Fax [phone] 12/29/2018    2:41 PM

## 2019-01-03 NOTE — Progress Notes (Signed)
[place]  [phone] [phone]    ID: Barbara Parks DOB: 07-22-1951  MR#: [id number]  VAP#:[id number]  Patient Care Team: [facility] as PCP - General (Family Medicine) Jerline Pain, MD (Cardiology) Gaye Pollack, MD (Cardiothoracic Surgery) Danielys Madry, Virgie Dad, MD as Consulting Physician (Oncology) OTHER MD: Dr Erroll Luna- Merlene Laughter, DDS; Gaynelle Arabian MD   CHIEF COMPLAINT: metastatic breast cancer, on BOLERO study  CURRENT TREATMENT: letrozole + everolimus   INTERVAL HISTORY: Barbara Parks returns today for follow-up and treatment of her metastatic estrogen receptor positive breast cancer.  She continues on letrozole. She tolerates this well. She reports moderate hot flashes and denies vaginal dryness.  She also continues on everolimus. She tolerates this well. She reports some fatigue and denies any mouth sores.  Since her last visit, she presented to the ED with progressive right back and chest pain on 12/12/2018. Angio chest CT performed that day was negative for pulmonary embolism and stable pulmonary nodules.  She also underwent PET scan on 12/22/2018, which revealed: development of multifocal osseous metastasis; no evidence of hypermetabolic soft tissue metastasis; left adrenal adenoma.  She does have a steadily rising CA-27-29.  Lab Results  Component Value Date   CA2729 175.1 (H) 11/19/2018   CA2729 126.4 (H) 08/31/2018   CA2729 106.2 (H) 06/11/2018   CA2729 79.2 (H) 03/17/2018   CA2729 74.0 (H) 12/25/2017    REVIEW OF SYSTEMS: Barbara Parks reports doing okay overall. She states she is worried about her situation. 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 She reports she has back issues (lower back and right ribcage), which has limited her from playing golf. She states she can play for a while, and then her whole back will  "tighten up." She takes ibuprofen and does stretches for relief. She notes she can only take the ibuprofen once a day because it makes her very sick to her stomach.  The patient denies unusual headaches, visual changes, nausea, vomiting, stiff neck, dizziness, or gait imbalance. There has been no cough, phlegm production, or pleurisy, no chest pain or pressure, and no change in bowel or bladder habits. The patient denies fever, rash, bleeding, unexplained fatigue or unexplained weight loss. A detailed review of systems was otherwise entirely negative.   BREAST CANCER HISTORY: From Dr. Collier [name]'s original intake note 04/13/2004:  "This woman has been in good health all of her life. She recently moved from [place] to work here.  She palpated a mass at about the 12 oclock position in mid-July. She has not noticed any nipple retraction or skin changes.  She was seen by her primary care doctor who subsequently referred her for a mammogram.  Mammogram was performed on 03/01/04. This demonstrated a spiculated 2 cm mass at the 12 oclock position in the right breast.  Upper outer quadrant of the left breast shows some distortion.   Physical exam at that time showed a firm, nontender nodule at the 12 oclock position in the right breast, 5 cm from the nipple.  Ultrasound of this area showed a hypoechoic ill-defined mass, measuring 2.2 x 1.3 x 1.4 cm.  Physical exam of the left breast showed general vague thickening, upper outer quadrant of the left breast with a discrete palpable mass. The ultrasound performed showed a single hypoechoic ill-defined nodule at the 1 oclock position, measuring 7 x 5 x 6 mm.  She had biopsies of both lesions on 03/02/03.  Needle core biopsy of the lesion on the right breast revealed invasive mammary carcinoma.  Needle core biopsy of the left breast showed a complex fibroadenoma.  Prognostic panel of the lesion on the right breast showed it to be ER positive at 73%, PR positive  at 90% and proliferative index 9%, HER-2 was 1+.  Patient was referred to Dr. Annamaria Boots, who performed a simple mastectomy with sentinel lymph node evaluation on 03/22/04.  Final pathology showed this to be an invasive ductal carcinoma  with lobular features, measuring 2.2 cm, grade 2 of 3.  Margins negative for carcinoma.  Invasive ductal carcinoma was extended to involve deep dermis of the nipple.  Lymphovascular invasion was identified.  Total of 4 sentinel lymph nodes were evaluated.  Touch imprints at the time of the OR was felt to be negative.  Subsequent evaluation showed a 5 mm focus of metastatic carcinoma in one of the four lymph nodes on microscopic after sectioning.  There was extracapsular extension  of one lymph node as well. The remaining three lymph nodes were all negative."  The patient's subsequent history is detailed above.   PAST MEDICAL HISTORY: Past Medical History:  Diagnosis Date   Arthritis    left hip   Asthma    breast ca 2005   breast/chemo R mastectomy   Dermatitis    Diabetes mellitus without complication ([place])    GERD (gastroesophageal reflux disease)    Hypercholesteremia    Metastasis to lung ([place]) dx'd 08/2011   Osteopenia due to cancer therapy 09/09/2013   Palpitations    Personal history of chemotherapy    Shortness of breath     PAST SURGICAL HISTORY: Past Surgical History:  Procedure Laterality Date   BREAST SURGERY  2005   right   CATARACT EXTRACTION W/PHACO Left 01/18/2014   Procedure: CATARACT EXTRACTION PHACO AND INTRAOCULAR LENS PLACEMENT ([place]);  Surgeon: Elta Guadeloupe T. Gershon Crane, MD;  Location: [facility];  Service: Ophthalmology;  Laterality: Left;  CDE 15.79   CATARACT EXTRACTION W/PHACO Right 02/08/2014   Procedure: CATARACT EXTRACTION PHACO AND INTRAOCULAR LENS PLACEMENT ([facility]);  Surgeon: Elta Guadeloupe T. Gershon Crane, MD;  Location: [facility];  Service: Ophthalmology;  Laterality: Right;  CDE 4.41   CHEST TUBE INSERTION  09/11/2011   Procedure: INSERTION  PLEURAL DRAINAGE CATHETER;  Surgeon: Gaye Pollack, MD;  Location: [facility];  Service: Thoracic;  Laterality: Right;   MASTECTOMY Right    PERICARDIAL WINDOW  09/11/2011   Procedure: PERICARDIAL WINDOW;  Surgeon: Gaye Pollack, MD;  Location: [facility];  Service: Thoracic;  Laterality: N/A;   REMOVAL OF PLEURAL DRAINAGE CATHETER  12/19/2011   Procedure: REMOVAL OF PLEURAL DRAINAGE CATHETER;  Surgeon: Gaye Pollack, MD;  Location: [facility];  Service: Thoracic;  Laterality: Right;  TO BE DONE IN MINOR ROOM, [place] Left 06/07/2015   Procedure: LEFT TOTAL HIP ARTHROPLASTY ANTERIOR APPROACH;  Surgeon: Gaynelle Arabian, MD;  Location: [facility];  Service: Orthopedics;  Laterality: Left;   VIDEO BRONCHOSCOPY  09/11/2011   Procedure: VIDEO BRONCHOSCOPY;  Surgeon: Gaye Pollack, MD;  Location: [facility];  Service: Thoracic;  Laterality: N/A;    FAMILY HISTORY Family History  Problem Relation Age of Onset   Cancer Mother        breast   Heart disease Father    Diabetes Other    Anesthesia problems Neg Hx  The patient's mother was diagnosed with breast cancer at age 31, she died age 35 from congestive heart failure.The patient's father died from heart disease at age 34.  She has one sister alive & well.  Two brothers alive & well, one with diabetes. The patient's sister was also diagnosed with breast cancer, and was tested for the BRCA gene, and was negative. The patient herself has not been tested. There is no history of ovarian cancer in the family   GYNECOLOGIC HISTORY:  No LMP recorded. Patient is postmenopausal. Menarche age 55, the patient is GX P0. She stopped having periods with chemotherapy in 2005. 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 She was a Archivist. She is single, lives alone with her Shitzu-poodle Sammie.. Family is all  in the [place] area.     ADVANCED DIRECTIVES: Not in place. At the 02/23/2014 visit the patient was given the appropriate documents to complete and notarize at her discretion. She tells me she is planning to name her sister, Billie Ruddy, as healthcare power of attorney. Barbara Parks can be reached at [phone]   HEALTH MAINTENANCE: Social History   Tobacco Use   Smoking status: Former Smoker    Packs/day: 1.50    Years: 30.00    Pack years: 45.00    Types: Cigarettes    Last attempt to quit: 09/10/2007    Years since quitting: 11.3   Smokeless tobacco: Never Used  Substance Use Topics   Alcohol use: No   Drug use: No    Colonoscopy:  PAP:  Bone density: 04/09/2018, T score -1.8  Lipid panel:  Allergies  Allergen Reactions   Aspirin     REACTION: upset stomach   Latex Other (See Comments)    Blistering and skin peels off   Nsaids Nausea And Vomiting    Extreme nausea and vomiting    Current Outpatient Medications  Medication Sig Dispense Refill   blood glucose meter kit and supplies KIT Dispense based on patient and insurance preference. Use up to four times daily as directed. (FOR ICD-9 250.00, 250.01). 1 each 0   cholecalciferol (VITAMIN D3) 25 MCG (1000 UT) tablet Take 2 tablets (2,000 Units total) by mouth daily. 180 tablet 4   cyclobenzaprine (FLEXERIL) 10 MG tablet Take 1 tablet (10 mg total) by mouth 2 (two) times daily as needed for muscle spasms. 20 tablet 0   everolimus (AFINITOR) 10 MG tablet Take 1 tablet (10 mg total) by mouth daily. 28 tablet 6   gemfibrozil (LOPID) 600 MG tablet Take 1 tablet (600 mg total) by mouth 2 (two) times daily before a meal. 180 tablet 1   Investigational everolimus (RAD001) 5 MG tablet Novartis [id number] Take 2 tablets by mouth daily. Take with a glass of water. 168 tablet 0   letrozole (FEMARA) 2.5 MG tablet Take 1 tablet (2.5 mg total) by mouth daily. 90 tablet 3   loperamide (IMODIUM) 2 MG capsule Take 2 mg  by mouth 4 (four) times daily as needed for diarrhea or loose stools. Reported on 11/30/2015     metFORMIN (GLUCOPHAGE-XR) 500 MG 24 hr tablet Take 1 tablet (500 mg total) by mouth 3 (three) times daily before meals.  3   No current facility-administered medications for this visit.     OBJECTIVE: Middle-aged white woman in no acute distress  Vitals:   01/04/19 0849 01/04/19  0850  BP: 110/63 109/64  Pulse:    Resp:    Temp:    SpO2:       Body mass index is 20.99 kg/m.   Filed Weights   01/04/19 0848  Weight: 124 lb 3.2 oz (56.3 kg)  Weight was 153 pounds June 2019 (down 20 pounds)    ECOG FS:1 - Symptomatic but completely ambulatory  Sclerae unicteric, EOMs intact No cervical or supraclavicular adenopathy Lungs no rales or rhonchi Heart regular rate and rhythm Abd soft, nontender, positive bowel sounds MSK no focal spinal tenderness, no upper extremity lymphedema Neuro: nonfocal, well oriented, appropriate affect Breasts: Deferred   LAB  RESULTS:  CMP     Component Value Date/Time   NA 139 12/12/2018 1416   NA 139 07/08/2017 0942   K 4.3 12/12/2018 1416   K 3.8 07/08/2017 0942   CL 107 12/12/2018 1416   CL 109 (H) 12/31/2012 0940   CO2 20 (L) 12/12/2018 1416   CO2 18 (L) 07/08/2017 0942   GLUCOSE 118 (H) 12/12/2018 1416   GLUCOSE 156 (H) 07/08/2017 0942   GLUCOSE 127 (H) 12/31/2012 0940   BUN 35 (H) 12/12/2018 1416   BUN 17.7 07/08/2017 0942   CREATININE 1.00 12/12/2018 1416   CREATININE 1.18 (H) 12/25/2017 0959   CREATININE 1.0 07/08/2017 0942   CALCIUM 9.9 12/12/2018 1416   CALCIUM 10.4 07/08/2017 0942   PROT 8.1 12/12/2018 1416   PROT 7.7 07/08/2017 0942   ALBUMIN 3.8 12/12/2018 1416   ALBUMIN 3.4 (L) 07/08/2017 0942   AST 24 12/12/2018 1416   AST 50 (H) 12/25/2017 0959   AST 54 (H) 07/08/2017 0942   ALT 16 12/12/2018 1416   ALT 53 12/25/2017 0959   ALT 39 07/08/2017 0942   ALKPHOS 173 (H) 12/12/2018 1416   ALKPHOS 232 (H) 07/08/2017 0942    BILITOT 0.4 12/12/2018 1416   BILITOT 0.3 12/25/2017 0959   BILITOT 0.42 07/08/2017 0942   GFRNONAA 58 (L) 12/12/2018 1416   GFRNONAA 47 (L) 12/25/2017 0959   GFRAA >60 12/12/2018 1416   GFRAA 54 (L) 12/25/2017 0959    I No results found for: SPEP  Lab Results  Component Value Date   WBC 7.4 01/04/2019   NEUTROABS 5.3 01/04/2019   HGB 14.5 01/04/2019   HCT 46.8 (H) 01/04/2019   MCV 82.7 01/04/2019   PLT 306 01/04/2019      Chemistry      Component Value Date/Time   NA 139 12/12/2018 1416   NA 139 07/08/2017 0942   K 4.3 12/12/2018 1416   K 3.8 07/08/2017 0942   CL 107 12/12/2018 1416   CL 109 (H) 12/31/2012 0940   CO2 20 (L) 12/12/2018 1416   CO2 18 (L) 07/08/2017 0942   BUN 35 (H) 12/12/2018 1416   BUN 17.7 07/08/2017 0942   CREATININE 1.00 12/12/2018 1416   CREATININE 1.18 (H) 12/25/2017 0959   CREATININE 1.0 07/08/2017 0942      Component Value Date/Time   CALCIUM 9.9 12/12/2018 1416   CALCIUM 10.4 07/08/2017 0942   ALKPHOS 173 (H) 12/12/2018 1416   ALKPHOS 232 (H) 07/08/2017 0942   AST 24 12/12/2018 1416   AST 50 (H) 12/25/2017 0959   AST 54 (H) 07/08/2017 0942   ALT 16 12/12/2018 1416   ALT 53 12/25/2017 0959   ALT 39 07/08/2017 0942   BILITOT 0.4 12/12/2018 1416   BILITOT 0.3 12/25/2017 0959   BILITOT 0.42 07/08/2017 3419  Lab Results  Component Value Date   LABCA2 58 (H) 09/14/2012    No components found for: [id number]  No results for input(s): INR in the last 168 hours.  Urinalysis    Component Value Date/Time   COLORURINE YELLOW 11/25/2017 0901   APPEARANCEUR HAZY (A) 11/25/2017 0901   LABSPEC 1.017 11/25/2017 0901   LABSPEC 1.030 07/13/2015 0841   PHURINE 5.0 11/25/2017 0901   GLUCOSEU NEGATIVE 11/25/2017 0901   GLUCOSEU Negative 07/13/2015 0841   HGBUR MODERATE (A) 11/25/2017 0901   BILIRUBINUR NEGATIVE 11/25/2017 0901   BILIRUBINUR Negative 07/13/2015 0841   KETONESUR NEGATIVE 11/25/2017 0901   PROTEINUR 100 (A)  11/25/2017 0901   UROBILINOGEN 0.2 07/13/2015 0841   NITRITE NEGATIVE 11/25/2017 0901   LEUKOCYTESUR NEGATIVE 11/25/2017 0901   LEUKOCYTESUR Negative 07/13/2015 0841    STUDIES: Dg Ribs Unilateral W/chest Right  Result Date: 12/12/2018 CLINICAL DATA:  Right mid back pain for the past week. History of metastatic breast cancer. EXAM: RIGHT RIBS AND CHEST - 3+ VIEW COMPARISON:  Chest CT - 11/19/2018 FINDINGS: Unchanged cardiac silhouette and mediastinal contours. There is mild diffuse slightly nodular thickening of the pulmonary interstitium. No discrete focal airspace opacities. No pleural effusion or pneumothorax. No evidence of edema. No acute osseous abnormalities. Specifically, no definite displaced right-sided rib fractures with special attention paid to the area demarcated by the radiopaque BB. Regional soft tissues appear normal. No radiopaque foreign body. Post right-sided mastectomy and right-sided axillary adenectomy. Stigmata of DISH within the thoracic spine. Degenerative change of the right AC joint is suspected though incompletely evaluated. IMPRESSION: No explanation for patient's right mid and back pain. Specifically, no definite displaced right-sided rib fractures. Given history of malignancy, as well as retrospective review of chest CT performed 11/19/2018 in which I question an approximately 1.2 x 0.8 cm lytic lesion involving the posterior aspect of the right tenth rib (image 105, series 7), further evaluation could be performed with the acquisition of a nuclear medicine bone scan and/or PET scan as indicated. Electronically Signed   By: Sandi Mariscal M.D.   On: 12/12/2018 15:01   Ct Angio Chest Pe W And/or Wo Contrast  Result Date: 12/12/2018 CLINICAL DATA:  Chest pain, history of right lung cancer, right breast cancer EXAM: CT ANGIOGRAPHY CHEST WITH CONTRAST TECHNIQUE: Multidetector CT imaging of the chest was performed using the standard protocol during bolus administration of  intravenous contrast. Multiplanar CT image reconstructions and MIPs were obtained to evaluate the vascular anatomy. CONTRAST:  3m OMNIPAQUE IOHEXOL 350 MG/ML SOLN COMPARISON:  11/19/2018 FINDINGS: Cardiovascular: Satisfactory opacification of the pulmonary arteries to the segmental level. No evidence of pulmonary embolism. Normal heart size. No pericardial effusion. Mediastinum/Nodes: No enlarged mediastinal, hilar, or axillary lymph nodes. Thyroid gland, trachea, and esophagus demonstrate no significant findings. Lungs/Pleura: No change in infrahilar soft tissue of the right lung and pulmonary nodules in the right lung base (series 6, image 120, 91). Numerous tiny, centrilobular pulmonary nodules unchanged from prior examination. Mild emphysema. No pleural effusion or pneumothorax. Upper Abdomen: No acute abnormality.  Stable left adrenal nodule. Musculoskeletal: Status post right mastectomy. No acute or significant osseous findings. Review of the MIP images confirms the above findings. IMPRESSION: 1.  Negative examination for pulmonary embolism. 2.  Unchanged post treatment appearance of right lung malignancy. 3. Numerous tiny, centrilobular pulmonary nodules unchanged from prior examination, nonspecific and likely infectious or inflammatory. 4.  Mild emphysema. 5.  Stable left adrenal nodule. Electronically Signed   By: ACristie Hem  Laqueta Carina M.D.   On: 12/12/2018 16:43   Nm Pet Image Restag (ps) Skull Base To Thigh  Result Date: 12/22/2018 CLINICAL DATA:  Subsequent treatment strategy for restaging of right breast cancer. Known lung metastasis. Rising tumor markers. Malignant pericardial effusion. EXAM: NUCLEAR MEDICINE PET SKULL BASE TO THIGH TECHNIQUE: A 6.2 mCi F-18 FDG was injected intravenously. Full-ring PET imaging was performed from the skull base to thigh after the radiotracer. CT data was obtained and used for attenuation correction and anatomic localization. Fasting blood glucose: 111 mg/dl COMPARISON:   Chest CT 12/12/2018. No comparison PET. Prior abdominopelvic CT of 11/28/2015 reviewed FINDINGS: Mediastinal blood pool activity: SUV max 2.7 Liver activity: SUV max NA NECK: No areas of abnormal hypermetabolism. Incidental CT findings: No cervical adenopathy. CHEST: No pulmonary parenchymal or thoracic nodal hypermetabolism. No correlate for the previously described pulmonary nodules. Incidental CT findings: Deferred to recent diagnostic CT. Right mastectomy. Trace right pleural thickening. ABDOMEN/PELVIS: No abdominopelvic parenchymal or nodal hypermetabolism. Incidental CT findings: Left adrenal nodule of 1.3 cm and 4 HU, most consistent with an adenoma. Mild gallbladder distension without calcified stone or pericholecystic edema. Upper normal common duct caliber. Periampullary duodenal diverticulum. Abdominal aortic atherosclerosis. Degraded evaluation of the pelvis, secondary to beam hardening artifact from left hip arthroplasty. SKELETON: Development of multifocal osseous hypermetabolism, consistent with metastatic disease. Anterior scapular body hypermetabolism and subtle cortical irregularity measures a S.U.V. max of 4.4 on image 62/4. Bilateral rib hypermetabolism, with a posterior right ninth rib focus measuring a S.U.V. max of 4.2 on image 93/4. An area of mild anterior right femoral head hypermetabolism measures a S.U.V. max of 3.3. Incidental CT findings: Left hip arthroplasty. IMPRESSION: 1. Development of multifocal osseous metastasis. 2. No evidence of hypermetabolic soft tissue metastasis. 3. Left adrenal adenoma. Electronically Signed   By: Abigail Miyamoto M.D.   On: 12/22/2018 10:39    ASSESSMENT: 68 y.o. Barbara Parks, Barbara Parks woman with stage IV breast cancer, on BOLERO-4 trial  (1) status post right mastectomy and sentinel lymph node sampling 03/22/2004 for a right upper-outer quadrant pT2 pN1, stage IIB invasive ductal carcinoma with lobular features, grade 2, estrogen receptor and progesterone receptor  positive, HER-2 negative, with an MIB-1 of 9% ([id number] and [id number])  (2) addition all right axillary lymph node sampling 05/07/2004 showed 2 benign lymph nodes (4 lymph nodes previously removed, so total was one positive lymph node out of 6; [id number])  (3) the patient was evaluated by radiation oncology; no postmastectomy radiation recommended  (4) adjuvant chemotherapy with dose dense doxorubicin and cyclophosphamide x4 cycles (first cycle delayed one week) followed by dose dense paclitaxel x4 was completed 09/18/2004  (5) tamoxifen started March 2006, discontinued 2009  METASTATIC DISEASE: February 2013 (6) presenting with a large pericardial effusion, large right pleural effusion and possible right middle lobe bronchial obstruction February 2013, status post pericardial window placement, fiberoptic bronchoscopy and right Pleurx placement 09/11/2011, with biopsy of the bronchus intermedius and pericardium positive for metastatic breast cancer, estrogen receptor 91% positive with moderate staining intensity, progesterone receptor 100% positive with strong staining intensity, with an MIB-1 of 35%, and no HER-2 amplification, the signals ratio being 1.37 ([id number])  (7) enrolled in BOLERO-4 trial 10/10/2011, receiving letrozole and everolimus  (a) two small areas of enhancement in the cerebellum noted by brain MRI 10/03/2011 were no longer apparent on repeat MRI 08/21/2012-- most recent brain MRI 04/01/2014 showed no evidence of intracranial metastatic disease  (b) sclerotic lesions in left iliac bone and sacrum have not  been biopsied; stable; plan is to start zolendronate after patient updates her dental care (extraction planned)  (c) RLL lung nodule, stable (rescanned 07/12/2014 and 08/11/2014) R hilar and subcarinal lymph nodes: stable  (d) most recent CT of the chest:03/25/2018, stable  (e) CT Angie 12/12/2018 stable  (8) additional problems:  (a) hepatic steatosis  (b) COPD/ emphysema/  asthma  (c) advanced L hip osteoarthritis, status post left total hip replacement 06/07/2015  (d) aortoiliac atherosclerosis  (e) dental evaluation pending w possible dental extractions  (f) likely thalassemia trait  (g) hyperlipidemia  (9) Bone density concerns: DEXA scan 08/11/2013 was normal  (a) repeat DEXA scan 04/09/2018 shows a T score of -1.8, osteopenia  (10) progression of bony disease:  (a) PET scan 12/22/2018 shows no hypermetabolic soft tissue metastases but multifocal bony metastasis  (b) denosumab/Xgeva started 01/04/2019, repeated every 28 days   PLAN: Tanina is now more than 7 years out from definitive diagnosis of metastatic breast cancer with well-controlled disease.  The PET scan in particular shows no evidence of visceral cancer activity which is very favorable.  She clearly continues to benefit from the everolimus/letrozole combination.  She tolerates that remarkably well also.  She does have a rising tumor markers and the PET does show increase in bone lesions.  Accordingly she is going to be started on denosumab/Xgeva and today we discussed extensively the possible toxicities side effects and complications.  She has discussed this with her dentist and he has set her up with an oral surgeon to see if she is going to need extractions.  Because of that we are not going to start the [place] today but we will hold off until the second week in July.  I have suggested that she work a little bit on nutrition and increase her calories as she has lost quite a bit of weight over the last 6 months.  She thinks this is due to metformin.  We also discussed how to control pain issues as particularly the first dose of [place] See is likely to make the bony aches worse temporarily  She will see me again in a month.  She knows to call for any other issues that may develop before that visit.  Virgie Dad. Yilia Sacca, MD Medical Oncology and Hematology [facility] [place], [place] [address] Tel. [phone]    Fax. [phone]   I, Wilburn Mylar, am acting as scribe for Dr. Virgie Dad. Hurshel Bouillon.  I, Lurline Del MD, have reviewed the above documentation for accuracy and completeness, and I agree with the above.

## 2019-01-04 ENCOUNTER — Inpatient Hospital Stay: Payer: Medicare Other

## 2019-01-04 ENCOUNTER — Inpatient Hospital Stay: Payer: Medicare Other | Attending: Oncology | Admitting: Oncology

## 2019-01-04 ENCOUNTER — Encounter: Payer: Self-pay | Admitting: *Deleted

## 2019-01-04 ENCOUNTER — Other Ambulatory Visit: Payer: Self-pay

## 2019-01-04 VITALS — BP 109/64 | HR 90 | Temp 98.0°F | Resp 17 | Ht 64.5 in | Wt 124.2 lb

## 2019-01-04 DIAGNOSIS — M8580 Other specified disorders of bone density and structure, unspecified site: Secondary | ICD-10-CM | POA: Insufficient documentation

## 2019-01-04 DIAGNOSIS — Z006 Encounter for examination for normal comparison and control in clinical research program: Secondary | ICD-10-CM | POA: Insufficient documentation

## 2019-01-04 DIAGNOSIS — C50911 Malignant neoplasm of unspecified site of right female breast: Secondary | ICD-10-CM

## 2019-01-04 DIAGNOSIS — C50411 Malignant neoplasm of upper-outer quadrant of right female breast: Secondary | ICD-10-CM | POA: Diagnosis not present

## 2019-01-04 DIAGNOSIS — Z9011 Acquired absence of right breast and nipple: Secondary | ICD-10-CM

## 2019-01-04 DIAGNOSIS — Z9221 Personal history of antineoplastic chemotherapy: Secondary | ICD-10-CM

## 2019-01-04 DIAGNOSIS — Z170 Estrogen receptor positive status [ER+]: Secondary | ICD-10-CM

## 2019-01-04 DIAGNOSIS — Z79811 Long term (current) use of aromatase inhibitors: Secondary | ICD-10-CM | POA: Diagnosis not present

## 2019-01-04 DIAGNOSIS — C7800 Secondary malignant neoplasm of unspecified lung: Secondary | ICD-10-CM

## 2019-01-04 DIAGNOSIS — C801 Malignant (primary) neoplasm, unspecified: Secondary | ICD-10-CM

## 2019-01-04 DIAGNOSIS — C7951 Secondary malignant neoplasm of bone: Secondary | ICD-10-CM | POA: Insufficient documentation

## 2019-01-04 DIAGNOSIS — R978 Other abnormal tumor markers: Secondary | ICD-10-CM | POA: Diagnosis not present

## 2019-01-04 DIAGNOSIS — I3131 Malignant pericardial effusion in diseases classified elsewhere: Secondary | ICD-10-CM

## 2019-01-04 DIAGNOSIS — C7989 Secondary malignant neoplasm of other specified sites: Secondary | ICD-10-CM | POA: Diagnosis not present

## 2019-01-04 LAB — CBC WITH DIFFERENTIAL/PLATELET
Abs Immature Granulocytes: 0.03 10*3/uL (ref 0.00–0.07)
Basophils Absolute: 0.1 10*3/uL (ref 0.0–0.1)
Basophils Relative: 1 %
Eosinophils Absolute: 0.1 10*3/uL (ref 0.0–0.5)
Eosinophils Relative: 1 %
HCT: 46.8 % — ABNORMAL HIGH (ref 36.0–46.0)
Hemoglobin: 14.5 g/dL (ref 12.0–15.0)
Immature Granulocytes: 0 %
Lymphocytes Relative: 15 %
Lymphs Abs: 1.1 10*3/uL (ref 0.7–4.0)
MCH: 25.6 pg — ABNORMAL LOW (ref 26.0–34.0)
MCHC: 31 g/dL (ref 30.0–36.0)
MCV: 82.7 fL (ref 80.0–100.0)
Monocytes Absolute: 0.8 10*3/uL (ref 0.1–1.0)
Monocytes Relative: 11 %
Neutro Abs: 5.3 10*3/uL (ref 1.7–7.7)
Neutrophils Relative %: 72 %
Platelets: 306 10*3/uL (ref 150–400)
RBC: 5.66 MIL/uL — ABNORMAL HIGH (ref 3.87–5.11)
RDW: 14.2 % (ref 11.5–15.5)
WBC: 7.4 10*3/uL (ref 4.0–10.5)
nRBC: 0 % (ref 0.0–0.2)

## 2019-01-04 LAB — COMPREHENSIVE METABOLIC PANEL
ALT: 12 U/L (ref 0–44)
AST: 23 U/L (ref 15–41)
Albumin: 3.9 g/dL (ref 3.5–5.0)
Alkaline Phosphatase: 179 U/L — ABNORMAL HIGH (ref 38–126)
Anion gap: 15 (ref 5–15)
BUN: 29 mg/dL — ABNORMAL HIGH (ref 8–23)
CO2: 18 mmol/L — ABNORMAL LOW (ref 22–32)
Calcium: 10.5 mg/dL — ABNORMAL HIGH (ref 8.9–10.3)
Chloride: 107 mmol/L (ref 98–111)
Creatinine, Ser: 1.09 mg/dL — ABNORMAL HIGH (ref 0.44–1.00)
GFR calc Af Amer: >60 mL/min (ref >60)
GFR calc non Af Amer: 52 mL/min — ABNORMAL LOW (ref >60)
Glucose, Bld: 112 mg/dL — ABNORMAL HIGH (ref 70–99)
Potassium: 3.9 mmol/L (ref 3.5–5.1)
Sodium: 140 mmol/L (ref 135–145)
Total Bilirubin: 0.3 mg/dL (ref 0.3–1.2)
Total Protein: 8.3 g/dL — ABNORMAL HIGH (ref 6.5–8.1)

## 2019-01-04 LAB — LIPID PANEL
Cholesterol: 281 mg/dL — ABNORMAL HIGH (ref 0–200)
HDL: 62 mg/dL (ref >40)
LDL Cholesterol: 172 mg/dL — ABNORMAL HIGH (ref 0–99)
Total CHOL/HDL Ratio: 4.5 RATIO
Triglycerides: 234 mg/dL — ABNORMAL HIGH (ref <150)
VLDL: 47 mg/dL — ABNORMAL HIGH (ref 0–40)

## 2019-01-04 NOTE — Telephone Encounter (Signed)
Oral Oncology Patient Advocate Encounter  I confirmed with the patient that Afinitor is scheduled to ship on 6/29 to be delivered on 7/1.  The patient knows to call the office with questions or concerns.  [facility] [place] Phone [phone] Fax [phone] 01/04/2019   10:40 AM

## 2019-01-04 NOTE — Research (Signed)
01/04/2019 at 9:51am - [employer]/ [facility] - Unscheduled (extension phase) visit- The research nurse received notification from the study team on 12/24/2018 that the study would "be ending in the [place]".  The monitor stated that once "commercial supply is secured, the pt currently on therapy must have their End of Study Treatment visit and return remaining investigational product".  The pt's commercial supply of everolimus was secured on 12/29/2018 by Johny Drilling, site pharmacist.  The pt was approved to receive compassionate use Afinitor from [employer] until 12/29/2019.  The pt stated that she was informed by the [employer] study team that her commercial everolimus would ship to her on 01/25/2019 for arrival on 01/27/2019.  The pt was instructed to begin her commercial supply on 01/27/2019 upon arrival and to discontinue her IP.  The pt stated that she would return her investigational product to the [facility] on 01/28/2019.  It is in the pt's best interest to remain on her IP with no interruptions in her everolimus dosing. As stated previously, the [employer] clinical trial team provided timelines for the study closure. Fabio Neighbors agreed that this pt's EOT visit could be extended until 01/28/19 in order for the pt to receive her commercial and discontinue her IP.  The pt was seen and examined by Dr. Jana Hakim today to discuss her treatment plan since the study is closing.  The pt reported some ongoing hot flashes, fatigue, and back pain.  The pt confirmed that she is still compliant with her daily doses of everolimus and letrozole.  Dr. Jana Hakim reviewed the pt's most recent scans, and he stated that he is overall pleased that the pt has no "soft tissue" disease.  He said that he believes the pt is still receiving "clinical benefit" from the everolimus and the pt should continue on her current regimen of everolimus 10 mg daily and letrozole 2.5 mg daily for now.  He said that he will repeat scans in the fall.  He did mention  that she has developed some new bone disease.  Since the pt has some upcoming dental procedures, he recommends starting her denosumab monthly injection in July.  Overall, all of the pt's questions/concerns were answered.  The pt was informed that the protocol states that "patients completing the Extension Phase will not be followed for survival".  The pt verbalized understanding.  The pt was thanked for her support of this trial for over 7 years.  The pt states that she will notify the research nurse of the actual last dose of her IP (everolimus).   Brion Aliment RN, BSN, CCRP Clinical Research Nurse 01/04/2019 10:24 AM  Adverse Event Log Protocol: [employer] [id number] Cycle: Extension Phase #14(AE date range4/30/20-01/04/19) Ongoing and unchanged in severity:fatigue, nail changes, memory impairment, dry skin,blurred vision,back pain and osteopenia "Not clinically significant" abnormal labs not required by the study to be reported as AE's are the following: alkaline phosphatase, creatinine increased, proteinuria (urine), BUN, carbon dioxide, total protein, and calcium, RBC, HCT, and MCH(no interventions required for the above abnormal lab values) Event Grade Onset Date Resolved Date Attribution to letrozole# Attribution to everolimus#  Hyperglycemia  1 01/04/2019 Ongoing No-Not Suspected Yes-Suspected  Hypertriglyceridemia 1 08/31/2018 Ongoing No- Not Suspected Yes-Suspected  Cholesterol High 1 08/31/2018 Ongoing Yes-Suspected Yes-Suspected  Hot Flashes  2 01/04/2019 Ongoing No-Not Suspected Yes-Suspected  Weight Loss 3 06/11/18 Ongoing No-Not Suspected No-Not Suspected  Myalgia  2 ~10/21/2018 Ongoing  No-Not Suspected  No-Not Suspected  Back pain  2 12/12/2018 Ongoing  No-Not Suspected No- Not Suspected

## 2019-01-05 ENCOUNTER — Telehealth: Payer: Self-pay | Admitting: Oncology

## 2019-01-05 LAB — CANCER ANTIGEN 27.29: CA 27.29: 224.5 U/mL — ABNORMAL HIGH (ref 0.0–38.6)

## 2019-01-05 NOTE — Telephone Encounter (Signed)
Talk with patient regarding reschedule

## 2019-01-07 ENCOUNTER — Ambulatory Visit: Payer: Medicare Other | Admitting: Oncology

## 2019-01-20 IMAGING — CT CT CHEST W/ CM
2 of 4 series · 14 of 36 positions shown, 17 images · IV contrast (iopamidol)
Comparison: Chest CT 01/21/2017.

CLINICAL DATA: 66-year-old female with history of metastatic
right-sided breast cancer originally diagnosed in 4009, currently
undergoing ongoing oral chemotherapy. Previously treated with
mastectomy and radiation therapy. Followup study.

EXAM:
CT CHEST WITH CONTRAST
TECHNIQUE: Multidetector CT imaging of the chest was performed during
intravenous contrast administration.
CONTRAST:  75mL YB31OR-NBB IOPAMIDOL (YB31OR-NBB) INJECTION 61%

[Series 2: axial st · axial · 0.82mm/px · z∈[+1196,+1508]mm · 11 of 183 slices shown, 14 images]
[im 14/183  mediastinal]
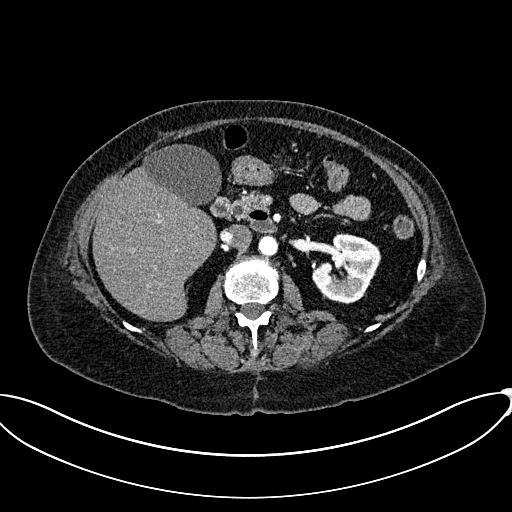
[im 14/183  lung]
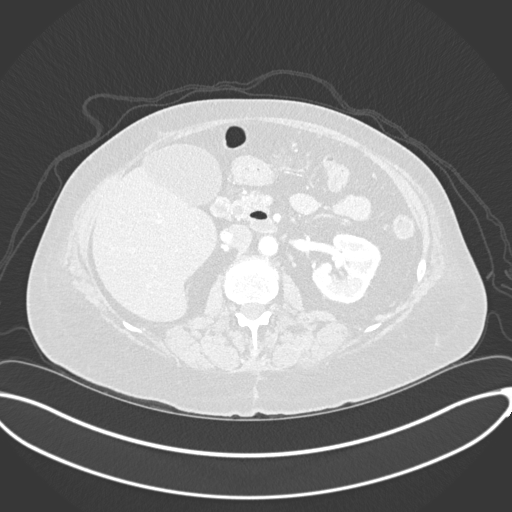
[im 27/183  lung]
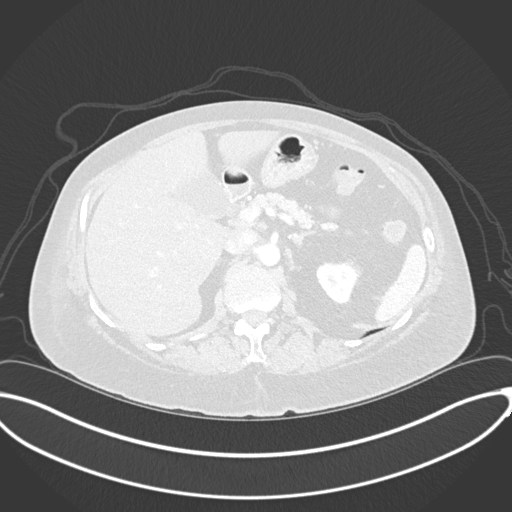
[im 40/183  lung]
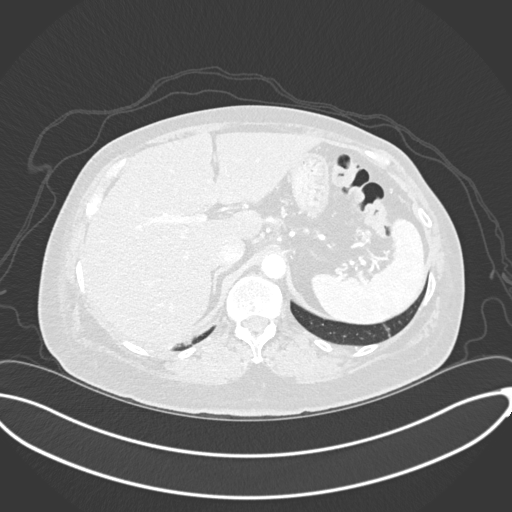
[im 66/183  lung]
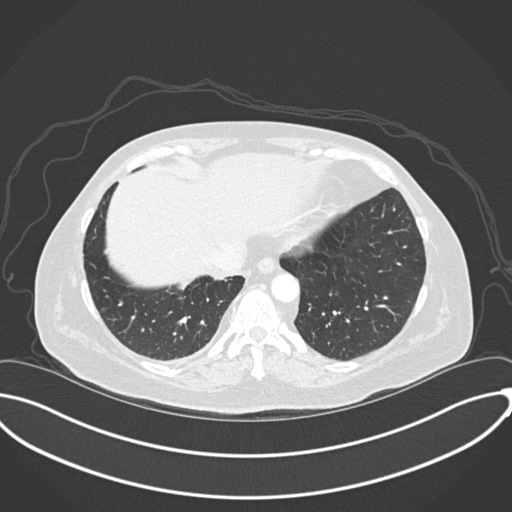
[im 79/183  mediastinal]
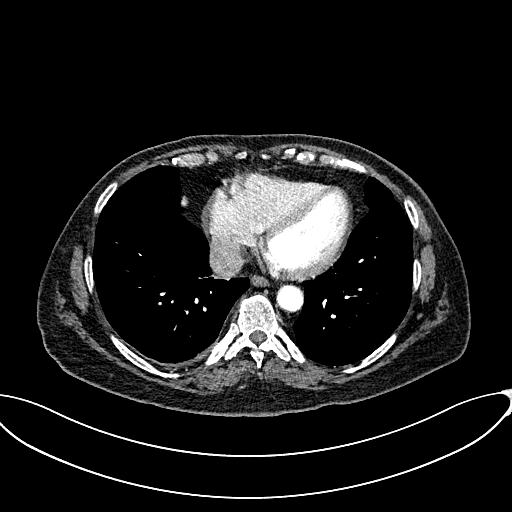
[im 79/183  lung]
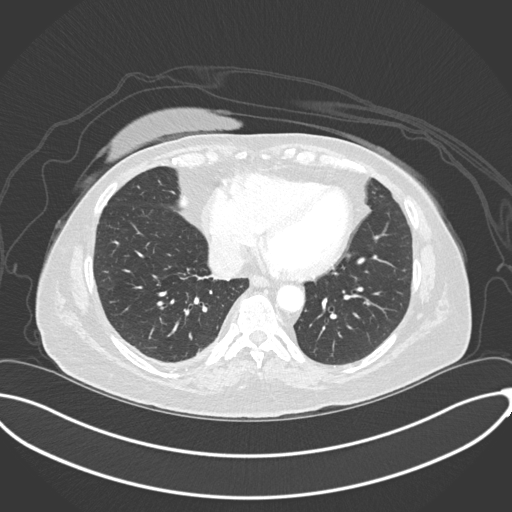
[im 92/183  lung]
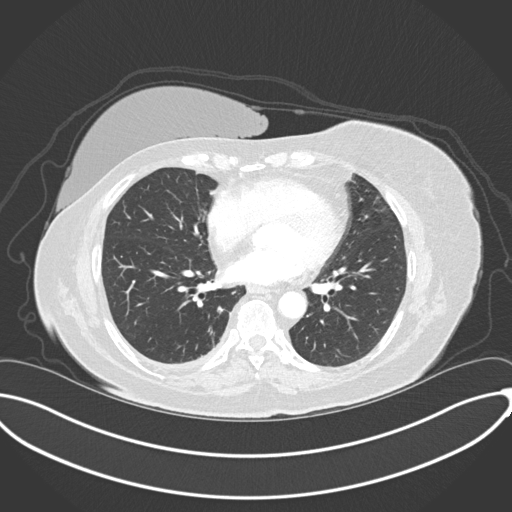
[im 105/183  lung]
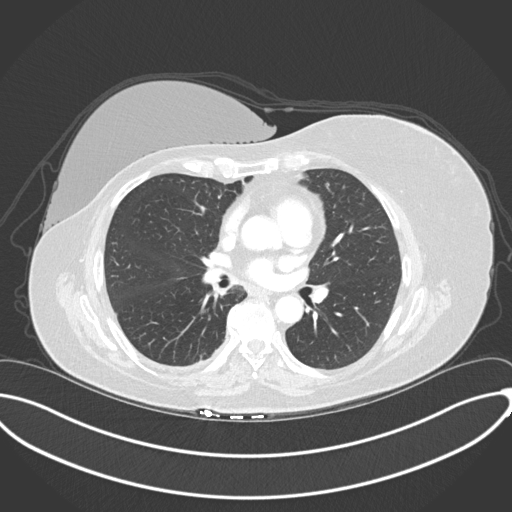
[im 118/183  lung]
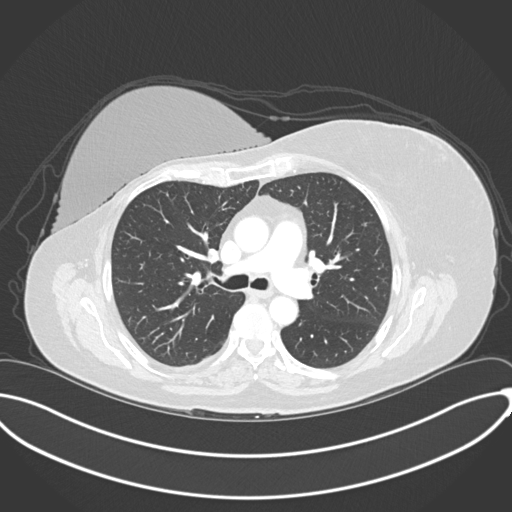
[im 144/183  mediastinal]
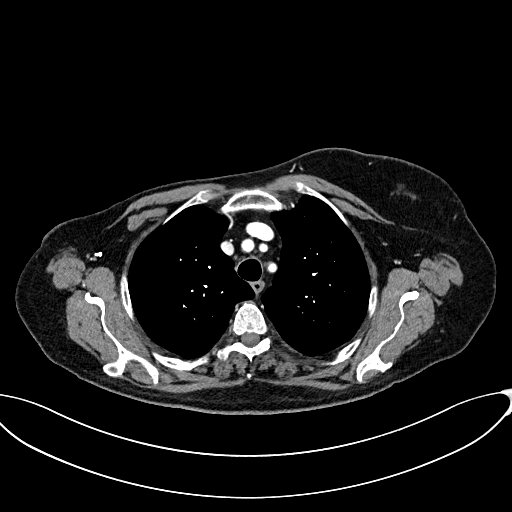
[im 144/183  lung]
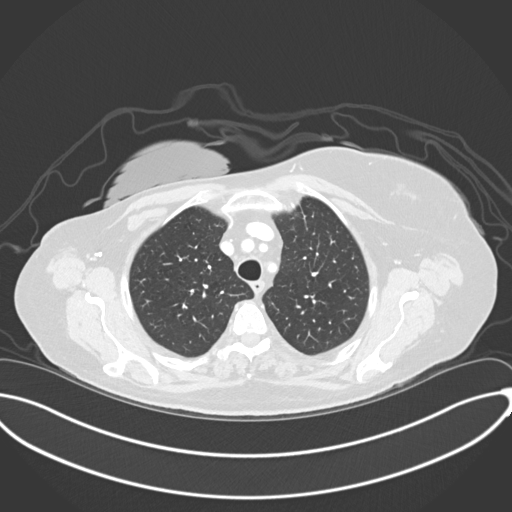
[im 157/183  lung]
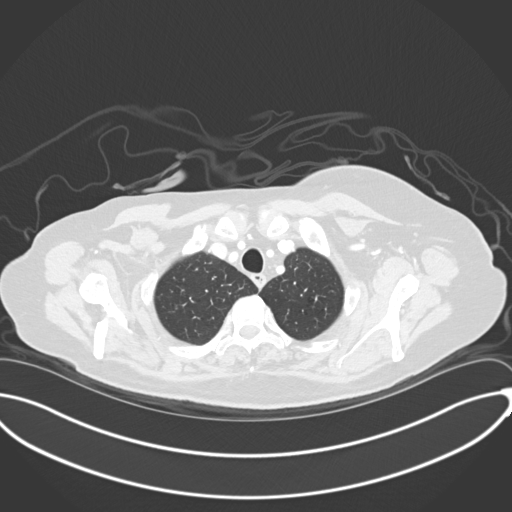
[im 170/183  lung]
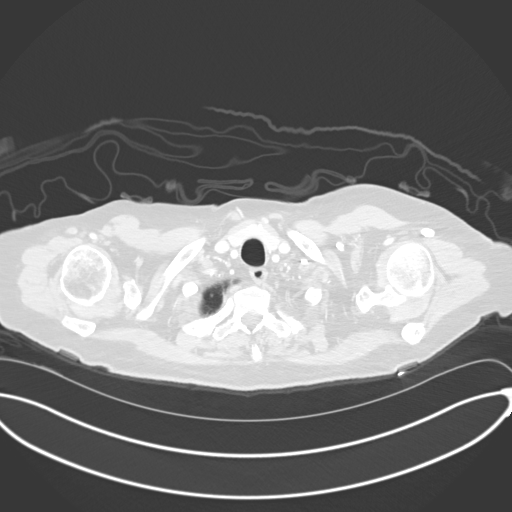

[Series 6: coronal · coronal · 0.72mm/px · 3 of 134 slices shown]
[im 27/134  lung]
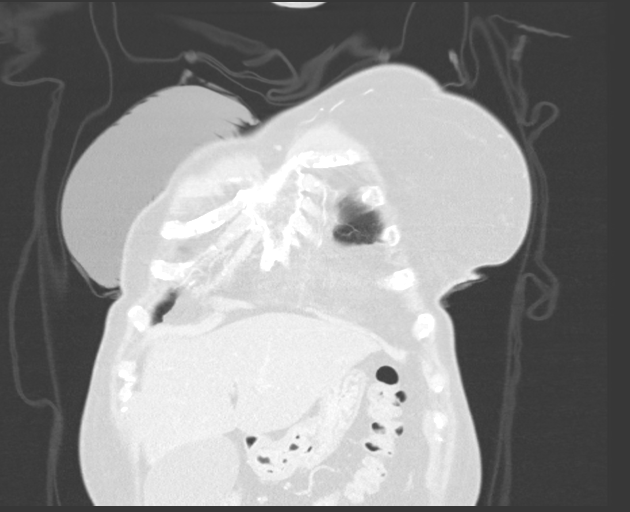
[im 54/134  lung]
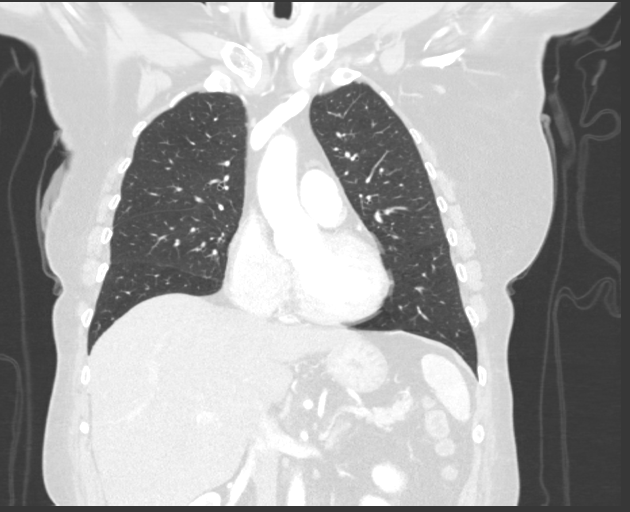
[im 80/134  lung]
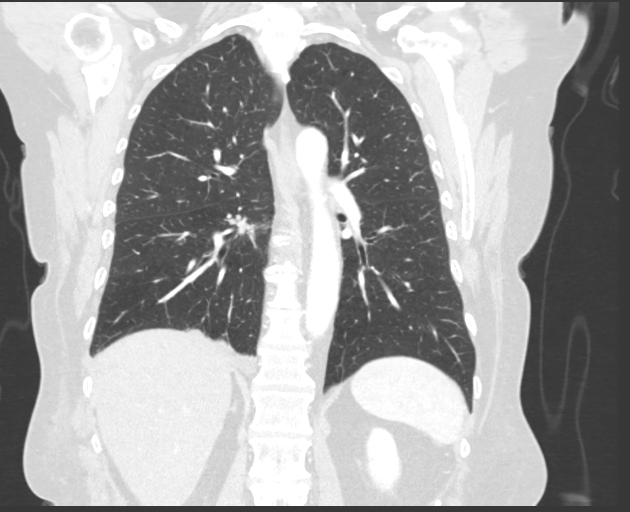

[14 of 36 positions shown; findings below may reference images not displayed]

FINDINGS: RECIST

Target Lesions:

1. 5 mm right lower lobe subpleural nodule (axial image 101 of
series 5) is stable.
2. 6 mm calcified subcarinal lymph node (axial image 71 of series 2)
unchanged.
Non-target Lesions:

1. Mild right pleural thickening unchanged.
2. Cluster of tiny 2-3 mm peribronchovascular micronodules in the
apex of the right upper lobe, stable and most compatible with areas
of mucoid impaction within terminal bronchioles.
Cardiovascular: Heart size is normal. There is no significant
pericardial fluid, thickening or pericardial calcification. Aortic
atherosclerosis. No definite coronary artery calcifications.

Mediastinum/Nodes: No pathologically enlarged mediastinal, hilar or
internal mammary lymph nodes. Esophagus is unremarkable in
appearance. No axillary lymphadenopathy. Surgical clips in the right
axilla related to prior lymph node dissection.

Lungs/Pleura: Cluster of tiny 2-3 mm peribronchovascular
micronodules in the apex of the right upper lobe, stable and most
compatible with areas of mucoid impaction within terminal
bronchioles. 5 mm right lower lobe subpleural nodule (axial image
101 of series 5) is stable. Increasingly conspicuous 7 x 13 mm
nodule in the inferior aspect of the right lower lobe (axial image
141 of series 5). New ill-defined 4 mm right lower lobe pulmonary
nodule (axial image 84 of series 5). No acute consolidative airspace
disease. No pleural effusions.

Upper Abdomen: Diffuse low attenuation throughout the visualized
hepatic parenchyma, compatible with severe hepatic steatosis. 1.4 x
1.0 cm left adrenal nodule (axial image 153 of series 2) is
unchanged.

Musculoskeletal: 9 mm centrally lucent and peripherally sclerotic
lesion in the lateral aspect of the right second rib (axial image 30
of series 5) stable over numerous prior examinations, likely benign.
No other definite suspicious appearing lytic or blastic lesions
noted in the visualized portions of the skeleton. Status post right
modified radical mastectomy and right axillary lymph node
dissection.
IMPRESSION: 1. New 7 x 13 mm nodule in the right lower lobe. This is nonspecific
but is concerning for potential primary malignancy versus solitary
metastatic lesion. The possibility of a benign area of scarring is
also considered, but not strongly favored at this time. Close
attention on followup studies is recommended, with repeat chest CT
recommended in 3 months at this time. Other previously described
lesions are stable compared to prior examinations, as above.
2. Stable mild right pleural thickening.
3. Hepatic steatosis.
4. Additional incidental findings, as above.

Aortic Atherosclerosis ([id number]).

## 2019-01-27 ENCOUNTER — Other Ambulatory Visit: Payer: Self-pay | Admitting: Oncology

## 2019-01-28 ENCOUNTER — Inpatient Hospital Stay (HOSPITAL_BASED_OUTPATIENT_CLINIC_OR_DEPARTMENT_OTHER): Payer: Medicare Other | Admitting: Adult Health

## 2019-01-28 ENCOUNTER — Telehealth: Payer: Self-pay | Admitting: Adult Health

## 2019-01-28 ENCOUNTER — Encounter: Payer: Self-pay | Admitting: *Deleted

## 2019-01-28 ENCOUNTER — Inpatient Hospital Stay: Payer: Medicare Other

## 2019-01-28 ENCOUNTER — Encounter: Payer: Self-pay | Admitting: Adult Health

## 2019-01-28 ENCOUNTER — Other Ambulatory Visit: Payer: Self-pay

## 2019-01-28 ENCOUNTER — Inpatient Hospital Stay: Payer: Medicare Other | Attending: Oncology

## 2019-01-28 VITALS — BP 117/61 | HR 91 | Resp 18 | Ht 64.5 in | Wt 126.8 lb

## 2019-01-28 DIAGNOSIS — Z9011 Acquired absence of right breast and nipple: Secondary | ICD-10-CM | POA: Insufficient documentation

## 2019-01-28 DIAGNOSIS — C801 Malignant (primary) neoplasm, unspecified: Secondary | ICD-10-CM

## 2019-01-28 DIAGNOSIS — C50411 Malignant neoplasm of upper-outer quadrant of right female breast: Secondary | ICD-10-CM | POA: Insufficient documentation

## 2019-01-28 DIAGNOSIS — Z170 Estrogen receptor positive status [ER+]: Secondary | ICD-10-CM | POA: Insufficient documentation

## 2019-01-28 DIAGNOSIS — C50911 Malignant neoplasm of unspecified site of right female breast: Secondary | ICD-10-CM

## 2019-01-28 DIAGNOSIS — C7800 Secondary malignant neoplasm of unspecified lung: Secondary | ICD-10-CM

## 2019-01-28 DIAGNOSIS — Z79811 Long term (current) use of aromatase inhibitors: Secondary | ICD-10-CM | POA: Insufficient documentation

## 2019-01-28 DIAGNOSIS — Z87891 Personal history of nicotine dependence: Secondary | ICD-10-CM | POA: Insufficient documentation

## 2019-01-28 DIAGNOSIS — Z833 Family history of diabetes mellitus: Secondary | ICD-10-CM | POA: Insufficient documentation

## 2019-01-28 DIAGNOSIS — Z006 Encounter for examination for normal comparison and control in clinical research program: Secondary | ICD-10-CM

## 2019-01-28 DIAGNOSIS — E119 Type 2 diabetes mellitus without complications: Secondary | ICD-10-CM | POA: Insufficient documentation

## 2019-01-28 DIAGNOSIS — Z794 Long term (current) use of insulin: Secondary | ICD-10-CM | POA: Insufficient documentation

## 2019-01-28 DIAGNOSIS — I3131 Malignant pericardial effusion in diseases classified elsewhere: Secondary | ICD-10-CM

## 2019-01-28 DIAGNOSIS — Z9221 Personal history of antineoplastic chemotherapy: Secondary | ICD-10-CM | POA: Insufficient documentation

## 2019-01-28 DIAGNOSIS — Z803 Family history of malignant neoplasm of breast: Secondary | ICD-10-CM | POA: Insufficient documentation

## 2019-01-28 DIAGNOSIS — M8580 Other specified disorders of bone density and structure, unspecified site: Secondary | ICD-10-CM | POA: Insufficient documentation

## 2019-01-28 DIAGNOSIS — C7951 Secondary malignant neoplasm of bone: Secondary | ICD-10-CM | POA: Insufficient documentation

## 2019-01-28 LAB — CBC WITH DIFFERENTIAL/PLATELET
Abs Immature Granulocytes: 0.02 10*3/uL (ref 0.00–0.07)
Basophils Absolute: 0.1 10*3/uL (ref 0.0–0.1)
Basophils Relative: 1 %
Eosinophils Absolute: 0.1 10*3/uL (ref 0.0–0.5)
Eosinophils Relative: 1 %
HCT: 44.3 % (ref 36.0–46.0)
Hemoglobin: 14.1 g/dL (ref 12.0–15.0)
Immature Granulocytes: 0 %
Lymphocytes Relative: 14 %
Lymphs Abs: 1.1 10*3/uL (ref 0.7–4.0)
MCH: 25.9 pg — ABNORMAL LOW (ref 26.0–34.0)
MCHC: 31.8 g/dL (ref 30.0–36.0)
MCV: 81.3 fL (ref 80.0–100.0)
Monocytes Absolute: 0.9 10*3/uL (ref 0.1–1.0)
Monocytes Relative: 12 %
Neutro Abs: 5.6 10*3/uL (ref 1.7–7.7)
Neutrophils Relative %: 72 %
Platelets: 300 10*3/uL (ref 150–400)
RBC: 5.45 MIL/uL — ABNORMAL HIGH (ref 3.87–5.11)
RDW: 13.9 % (ref 11.5–15.5)
WBC: 7.8 10*3/uL (ref 4.0–10.5)
nRBC: 0 % (ref 0.0–0.2)

## 2019-01-28 LAB — COMPREHENSIVE METABOLIC PANEL
ALT: 12 U/L (ref 0–44)
AST: 20 U/L (ref 15–41)
Albumin: 3.6 g/dL (ref 3.5–5.0)
Alkaline Phosphatase: 150 U/L — ABNORMAL HIGH (ref 38–126)
Anion gap: 13 (ref 5–15)
BUN: 21 mg/dL (ref 8–23)
CO2: 18 mmol/L — ABNORMAL LOW (ref 22–32)
Calcium: 9.8 mg/dL (ref 8.9–10.3)
Chloride: 109 mmol/L (ref 98–111)
Creatinine, Ser: 1.01 mg/dL — ABNORMAL HIGH (ref 0.44–1.00)
GFR calc Af Amer: >60 mL/min (ref >60)
GFR calc non Af Amer: 57 mL/min — ABNORMAL LOW (ref >60)
Glucose, Bld: 104 mg/dL — ABNORMAL HIGH (ref 70–99)
Potassium: 4.4 mmol/L (ref 3.5–5.1)
Sodium: 140 mmol/L (ref 135–145)
Total Bilirubin: <0.2 mg/dL — ABNORMAL LOW (ref 0.3–1.2)
Total Protein: 7.6 g/dL (ref 6.5–8.1)

## 2019-01-28 NOTE — Research (Signed)
01/28/2019 at 10:20am - Research [employer]/[facility] 4 End of Treatment Visit- Thepatientwasintotheclinicthismorningunaccompanied forherend of treatment visit. Patient returned her 6 everolimus kits with46tablets returned. The pt's overall drug compliance is 98.4%for the date range of4/30/2020-01/26/2019. Patient returned her paper calendarsdocumenting daily doses of letrozole2.5 mgand everolimus10 mgwith39missed doseon the following date: 12/14/18.Medication boxeswerereturned to thepharmacy for the drug accountability check by pharmacist,Brandy Corey Skains.The pt said that she is feeling well today.  She said her only compliant is some moderate jaw pain from a recent dental procedure.  The pt reported developing "dry socket" which is causing her jaw discomfort.  It was decided to hold her Xgeva injection until her jaw pain is resolved completely.  The pt reports enjoying a recent trip home to [place].  The pt said that she stayed busy playing with her sister's new puppy and golfing with her brother.  The pt reported ongoing myalgia (muscle pain).  ECOG=1The pt denies any new medication changes.Patient reports the previously noted AE's (fatigue, dry skin, brittle nails, back pain, and memory impairment)as ongoingand unchanged in severity. Patient reports her blurry vision in her left eye is still ongoing.She has not yet scheduled an appointment with her ophthalmologist for additional follow up.The pt has had intentional, ongoingweight loss because she is managing her diabetes and eating fewer carbs and more vegetables.The pt was seen and examined by Gardenia Phlegm, NP today.  The pt reported her last dose of study drug was taken on 01/26/19.  The pt said that she started on her commercial Affinitor on 01/27/19.  The pt knows that she will need to be evaluated for her 28 day Safety Follow Up visit on 02/25/19.  The pt was thanked for her continued support of the trial over  the past 7 years.   Adverse Event Log Protocol: [employer] [id number] AE date range6/9/20-01/28/19) Ongoing and unchanged in severity:fatigue, nail changes, memory impairment, dry skin,blurred vision,back pain and osteopenia "Not clinically significant" abnormal labs not required by the study to be reported as AE's are the following: alkaline phosphatase, creatinine increased, proteinuria (urine), carbon dioxide, total bilirubin, RBC and MCH(no interventions required for the above abnormal lab values) Event Grade Onset Date Resolved Date Attribution to letrozole# Attribution to everolimus#  Hyperglycemia  1 01/04/2019 Ongoing No-Not Suspected Yes-Suspected  Hypertriglyceridemia 1 08/31/2018 Ongoing No- Not Suspected Yes-Suspected  Cholesterol High 1 08/31/2018 Ongoing Yes-Suspected Yes-Suspected  Hot Flashes  2 01/04/2019 Ongoing No-Not Suspected Yes-Suspected  Weight Loss 3 06/11/18 Ongoing No-Not Suspected No-Not Suspected  Myalgia  2 ~10/21/2018 Ongoing  No-Not Suspected  No-Not Suspected  Rib pain  2 12/12/2018 Ongoing No-Not Suspected No- Not Suspected  Jaw pain 2 01/28/2019 Ongoing No-Not Suspected No-Not Suspected   Brion Aliment RN, BSN, CCRP  Clinical Research Nurse 01/28/2019 11:13 AM

## 2019-01-28 NOTE — Progress Notes (Signed)
[place]  Telephone:(336) [phone] Fax:(336) [phone]    ID: Barbara Parks DOB: [birth year]  MR#: [id number]  VQM#:[id number]  Patient Care Team: Barbara Parks as PCP - General (Family Medicine) Jerline Pain, MD (Cardiology) Gaye Pollack, MD (Cardiothoracic Surgery) Magrinat, Virgie Dad, MD as Consulting Physician (Oncology) OTHER MD: Dr Erroll Luna- Barbara Parks, DDS; Gaynelle Arabian MD   CHIEF COMPLAINT: metastatic breast cancer, on BOLERO study  CURRENT TREATMENT: letrozole + everolimus   INTERVAL HISTORY: Barbara Parks returns today for follow-up and treatment of her metastatic estrogen receptor positive breast cancer.  She continues on letrozole. She tolerates this well.    She also continues on everolimus.  Her last dose of study drug was on 01/26/2019.    On her last PET scan on 12/22/2018 there was evidence for increasing bone lesions.  She is due to start on Xgeva today. She underwent extraction last month, and has had delayed healing with a dry socket, and notes pain in that area.    REVIEW OF SYSTEMS: Barbara Parks has lost weight.  She was diagnosed with diabetes and lost weight by taking metformin, and following a diabetic diet.  She is doing well otherwise.  She had recently traveled to see her sister and just returned.  She has remained active and enjoyed chasing her sister's puppy.  Barbara Parks denies any new issues, fever, chills, chest pain, palpitations, cough, shortness of breath.  She has occasional bone aches and pains, which are relieved with advil.  She denies any bowel/bladder changes, appetite loss, dysphagia, mucositis, or any other concerns.  A detailed ROS was otherwise non contributory.    BREAST CANCER HISTORY: From Dr. Collier Salina Parks's original intake note 04/13/2004:  "[name] has been in good health all of her life. She recently moved from [place] to work here.  She palpated a mass at about the 12 oclock position in mid-July.  She has not noticed any nipple retraction or skin changes.  She was seen by her primary care doctor who subsequently referred her for a mammogram.  Mammogram was performed on 03/01/04. This demonstrated a spiculated 2 cm mass at the 12 oclock position in the right breast.  Upper outer quadrant of the left breast shows some distortion.   Physical exam at that time showed a firm, nontender nodule at the 12 oclock position in the right breast, 5 cm from the nipple.  Ultrasound of this area showed a hypoechoic ill-defined mass, measuring 2.2 x 1.3 x 1.4 cm.  Physical exam of the left breast showed general vague thickening, upper outer quadrant of the left breast with a discrete palpable mass. The ultrasound performed showed a single hypoechoic ill-defined nodule at the 1 oclock position, measuring 7 x 5 x 6 mm.    She had biopsies of both lesions on 03/02/03.  Needle core biopsy of the lesion on the right breast revealed invasive mammary carcinoma.  Needle core biopsy of the left breast showed a complex fibroadenoma.  Prognostic panel of the lesion on the right breast showed it to be ER positive at 73%, PR positive at 90% and proliferative index 9%, HER-2 was 1+.  Patient was referred to Dr. Annamaria Parks, who performed a simple mastectomy with sentinel lymph node evaluation on 03/22/04.  Final pathology showed this to be an invasive ductal carcinoma  with lobular features, measuring 2.2 cm, grade 2 of 3.  Margins negative for carcinoma.  Invasive ductal carcinoma was extended to involve deep dermis of the  nipple.  Lymphovascular invasion was identified.  Total of 4 sentinel lymph nodes were evaluated.  Touch imprints at the time of the OR was felt to be negative.  Subsequent evaluation showed a 5 mm focus of metastatic carcinoma in one of the four lymph nodes on microscopic after sectioning.  There was extracapsular extension  of one lymph node as well. The remaining three lymph nodes were all negative."  The patient's  subsequent history is detailed above.   PAST MEDICAL HISTORY: Past Medical History:  Diagnosis Date   Arthritis    left hip   Asthma    breast ca 2005   breast/chemo R mastectomy   Dermatitis    Diabetes mellitus without complication ([place])    GERD (gastroesophageal reflux disease)    Hypercholesteremia    Metastasis to lung ([place]) dx'd 08/2011   Osteopenia due to cancer therapy 09/09/2013   Palpitations    Personal history of chemotherapy    Shortness of breath     PAST SURGICAL HISTORY: Past Surgical History:  Procedure Laterality Date   BREAST SURGERY  2005   right   CATARACT EXTRACTION W/PHACO Left 01/18/2014   Procedure: CATARACT EXTRACTION PHACO AND INTRAOCULAR LENS PLACEMENT ([place]);  Surgeon: Elta Guadeloupe T. Gershon Crane, MD;  Location: [facility];  Service: Ophthalmology;  Laterality: Left;  CDE 15.79   CATARACT EXTRACTION W/PHACO Right 02/08/2014   Procedure: CATARACT EXTRACTION PHACO AND INTRAOCULAR LENS PLACEMENT ([facility]);  Surgeon: Elta Guadeloupe T. Gershon Crane, MD;  Location: [facility];  Service: Ophthalmology;  Laterality: Right;  CDE 4.41   CHEST TUBE INSERTION  09/11/2011   Procedure: INSERTION PLEURAL DRAINAGE CATHETER;  Surgeon: Gaye Pollack, MD;  Location: [facility];  Service: Thoracic;  Laterality: Right;   MASTECTOMY Right    PERICARDIAL WINDOW  09/11/2011   Procedure: PERICARDIAL WINDOW;  Surgeon: Gaye Pollack, MD;  Location: [facility];  Service: Thoracic;  Laterality: N/A;   REMOVAL OF PLEURAL DRAINAGE CATHETER  12/19/2011   Procedure: REMOVAL OF PLEURAL DRAINAGE CATHETER;  Surgeon: Gaye Pollack, MD;  Location: [facility];  Service: Thoracic;  Laterality: Right;  TO BE DONE IN MINOR ROOM, [place] Left 06/07/2015   Procedure: LEFT TOTAL HIP ARTHROPLASTY ANTERIOR APPROACH;  Surgeon: Gaynelle Arabian, MD;  Location: [facility];  Service: Orthopedics;  Laterality: Left;   VIDEO BRONCHOSCOPY  09/11/2011   Procedure: VIDEO BRONCHOSCOPY;  Surgeon:  Gaye Pollack, MD;  Location: [facility];  Service: Thoracic;  Laterality: N/A;    FAMILY HISTORY Family History  Problem Relation Age of Onset   Cancer Mother        breast   Heart disease Father    Diabetes Other    Anesthesia problems Neg Hx    The patient's mother was diagnosed with breast cancer at age 52, she died age 31 from congestive heart failure.The patient's father died from heart disease at age 37.  She has one sister alive & well.  Two brothers alive & well, one with diabetes. The patient's sister was also diagnosed with breast cancer, and was tested for the BRCA gene, and was negative. The patient herself has not been tested. There is no history of ovarian cancer in the family   GYNECOLOGIC HISTORY:  No LMP recorded. Patient is postmenopausal. Menarche age 76, the patient is GX P0. She stopped having periods with chemotherapy in 2005. 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 She was a Archivist. She is single, lives alone with her Shitzu-poodle Sammie.. Family is all in the [place] area.     ADVANCED DIRECTIVES: Not in place. At the 02/23/2014 visit the patient was given the appropriate documents to complete and notarize at her discretion. She tells me she is planning to name her sister, Billie Ruddy, as healthcare power of attorney. [name] can be reached at [phone]   HEALTH MAINTENANCE: Social History   Tobacco Use   Smoking status: Former Smoker    Packs/day: 1.50    Years: 30.00    Pack years: 45.00    Types: Cigarettes    Quit date: 09/10/2007    Years since quitting: 11.3   Smokeless tobacco: Never Used  Substance Use Topics   Alcohol use: No   Drug use: No    Colonoscopy:  PAP:  Bone density: 04/09/2018, T score -1.8  Lipid panel:  Allergies  Allergen Reactions   Aspirin     REACTION: upset stomach   Latex Other  (See Comments)    Blistering and skin peels off   Nsaids Nausea And Vomiting    Extreme nausea and vomiting    Current Outpatient Medications  Medication Sig Dispense Refill   blood glucose meter kit and supplies KIT Dispense based on patient and insurance preference. Use up to four times daily as directed. (FOR ICD-9 250.00, 250.01). 1 each 0   cholecalciferol (VITAMIN D3) 25 MCG (1000 UT) tablet Take 2 tablets (2,000 Units total) by mouth daily. 180 tablet 4   cyclobenzaprine (FLEXERIL) 10 MG tablet Take 1 tablet (10 mg total) by mouth 2 (two) times daily as needed for muscle spasms. 20 tablet 0   everolimus (AFINITOR) 10 MG tablet Take 1 tablet (10 mg total) by mouth daily. 28 tablet 6   gemfibrozil (LOPID) 600 MG tablet TAKE 1 TABLET (600 MG TOTAL) BY MOUTH 2 (TWO) TIMES DAILY BEFORE A MEAL. 180 tablet 1   Investigational everolimus (RAD001) 5 MG tablet Novartis [id number] Take 2 tablets by mouth daily. Take with a glass of water. 168 tablet 0   letrozole (FEMARA) 2.5 MG tablet Take 1 tablet (2.5 mg total) by mouth daily. 90 tablet 3   loperamide (IMODIUM) 2 MG capsule Take 2 mg by mouth 4 (four) times daily as needed for diarrhea or loose stools. Reported on 11/30/2015     metFORMIN (GLUCOPHAGE-XR) 500 MG 24 hr tablet Take 1 tablet (500 mg total) by mouth 3 (three) times daily before meals.  3   No current facility-administered medications for this visit.     OBJECTIVE:  Vitals:   01/28/19 0854 01/28/19 0855  BP: 123/66 117/61  Pulse:    Resp:    SpO2:       Body mass index is 21.43 kg/m.   Filed Weights   01/28/19 0853  Weight: 126 lb 12.8 oz (57.5 kg)  Weight was 153 pounds June 2019 (down 20 pounds) ECOG FS:1 - Symptomatic but completely ambulatory GENERAL: Patient is a well appearing female in no acute distress HEENT:  Sclerae anicteric.  Oropharynx clear and moist. No ulcerations or evidence of oropharyngeal candidiasis. Neck is supple.  NODES:  No cervical,  supraclavicular, or axillary lymphadenopathy palpated.  BREAST EXAM:  Right breast s/p mastectomy, no sign of recurrence, left breast benign LUNGS:  Clear to auscultation bilaterally.  No wheezes or rhonchi. HEART:  Regular rate  and rhythm. No murmur appreciated. ABDOMEN:  Soft, nontender.  Positive, normoactive bowel sounds. No organomegaly palpated. MSK:  No focal spinal tenderness to palpation. Full range of motion bilaterally in the upper extremities. EXTREMITIES:  No peripheral edema.   SKIN:  Clear with no obvious rashes or skin changes. No nail dyscrasia. NEURO:  Nonfocal. Well oriented.  Appropriate affect.     LAB  RESULTS:  CMP     Component Value Date/Time   NA 140 01/04/2019 0828   NA 139 07/08/2017 0942   K 3.9 01/04/2019 0828   K 3.8 07/08/2017 0942   CL 107 01/04/2019 0828   CL 109 (H) 12/31/2012 0940   CO2 18 (L) 01/04/2019 0828   CO2 18 (L) 07/08/2017 0942   GLUCOSE 112 (H) 01/04/2019 0828   GLUCOSE 156 (H) 07/08/2017 0942   GLUCOSE 127 (H) 12/31/2012 0940   BUN 29 (H) 01/04/2019 0828   BUN 17.7 07/08/2017 0942   CREATININE 1.09 (H) 01/04/2019 0828   CREATININE 1.18 (H) 12/25/2017 0959   CREATININE 1.0 07/08/2017 0942   CALCIUM 10.5 (H) 01/04/2019 0828   CALCIUM 10.4 07/08/2017 0942   PROT 8.3 (H) 01/04/2019 0828   PROT 7.7 07/08/2017 0942   ALBUMIN 3.9 01/04/2019 0828   ALBUMIN 3.4 (L) 07/08/2017 0942   AST 23 01/04/2019 0828   AST 50 (H) 12/25/2017 0959   AST 54 (H) 07/08/2017 0942   ALT 12 01/04/2019 0828   ALT 53 12/25/2017 0959   ALT 39 07/08/2017 0942   ALKPHOS 179 (H) 01/04/2019 0828   ALKPHOS 232 (H) 07/08/2017 0942   BILITOT 0.3 01/04/2019 0828   BILITOT 0.3 12/25/2017 0959   BILITOT 0.42 07/08/2017 0942   GFRNONAA 52 (L) 01/04/2019 0828   GFRNONAA 47 (L) 12/25/2017 0959   GFRAA >60 01/04/2019 0828   GFRAA 54 (L) 12/25/2017 0959    I No results found for: SPEP  Lab Results  Component Value Date   WBC 7.8 01/28/2019   NEUTROABS  5.6 01/28/2019   HGB 14.1 01/28/2019   HCT 44.3 01/28/2019   MCV 81.3 01/28/2019   PLT 300 01/28/2019      Chemistry      Component Value Date/Time   NA 140 01/04/2019 0828   NA 139 07/08/2017 0942   K 3.9 01/04/2019 0828   K 3.8 07/08/2017 0942   CL 107 01/04/2019 0828   CL 109 (H) 12/31/2012 0940   CO2 18 (L) 01/04/2019 0828   CO2 18 (L) 07/08/2017 0942   BUN 29 (H) 01/04/2019 0828   BUN 17.7 07/08/2017 0942   CREATININE 1.09 (H) 01/04/2019 0828   CREATININE 1.18 (H) 12/25/2017 0959   CREATININE 1.0 07/08/2017 0942      Component Value Date/Time   CALCIUM 10.5 (H) 01/04/2019 0828   CALCIUM 10.4 07/08/2017 0942   ALKPHOS 179 (H) 01/04/2019 0828   ALKPHOS 232 (H) 07/08/2017 0942   AST 23 01/04/2019 0828   AST 50 (H) 12/25/2017 0959   AST 54 (H) 07/08/2017 0942   ALT 12 01/04/2019 0828   ALT 53 12/25/2017 0959   ALT 39 07/08/2017 0942   BILITOT 0.3 01/04/2019 0828   BILITOT 0.3 12/25/2017 0959   BILITOT 0.42 07/08/2017 0942       Lab Results  Component Value Date   LABCA2 58 (H) 09/14/2012    No components found for: PRFFM384  No results for input(s): INR in the last 168 hours.  Urinalysis    Component Value Date/Time  COLORURINE YELLOW 11/25/2017 0901   APPEARANCEUR HAZY (A) 11/25/2017 0901   LABSPEC 1.017 11/25/2017 0901   LABSPEC 1.030 07/13/2015 0841   PHURINE 5.0 11/25/2017 0901   GLUCOSEU NEGATIVE 11/25/2017 0901   GLUCOSEU Negative 07/13/2015 0841   HGBUR MODERATE (A) 11/25/2017 0901   BILIRUBINUR NEGATIVE 11/25/2017 0901   BILIRUBINUR Negative 07/13/2015 0841   KETONESUR NEGATIVE 11/25/2017 0901   PROTEINUR 100 (A) 11/25/2017 0901   UROBILINOGEN 0.2 07/13/2015 0841   NITRITE NEGATIVE 11/25/2017 0901   LEUKOCYTESUR NEGATIVE 11/25/2017 0901   LEUKOCYTESUR Negative 07/13/2015 0841    STUDIES: No results found.  ASSESSMENT: 68 y.o. Barbara Parks, Barbara Parks with stage IV breast cancer, on BOLERO-4 trial  (1) status post right mastectomy and  sentinel lymph node sampling 03/22/2004 for a right upper-outer quadrant pT2 pN1, stage IIB invasive ductal carcinoma with lobular features, grade 2, estrogen receptor and progesterone receptor positive, HER-2 negative, with an MIB-1 of 9% ([id number] and [id number])  (2) addition all right axillary lymph node sampling 05/07/2004 showed 2 benign lymph nodes (4 lymph nodes previously removed, so total was one positive lymph node out of 6; [id number])  (3) the patient was evaluated by radiation oncology; no postmastectomy radiation recommended  (4) adjuvant chemotherapy with dose dense doxorubicin and cyclophosphamide x4 cycles (first cycle delayed one week) followed by dose dense paclitaxel x4 was completed 09/18/2004  (5) tamoxifen started March 2006, discontinued 2009  METASTATIC DISEASE: February 2013 (6) presenting with a large pericardial effusion, large right pleural effusion and possible right middle lobe bronchial obstruction February 2013, status post pericardial window placement, fiberoptic bronchoscopy and right Pleurx placement 09/11/2011, with biopsy of the bronchus intermedius and pericardium positive for metastatic breast cancer, estrogen receptor 91% positive with moderate staining intensity, progesterone receptor 100% positive with strong staining intensity, with an MIB-1 of 35%, and no HER-2 amplification, the signals ratio being 1.37 ([id number])  (7) enrolled in BOLERO-4 trial 10/10/2011, receiving letrozole and everolimus  (a) two small areas of enhancement in the cerebellum noted by brain MRI 10/03/2011 were no longer apparent on repeat MRI 08/21/2012-- most recent brain MRI 04/01/2014 showed no evidence of intracranial metastatic disease  (b) sclerotic lesions in left iliac bone and sacrum have not been biopsied; stable; plan is to start zolendronate after patient updates her dental care (extraction planned)  (c) RLL lung nodule, stable (rescanned 07/12/2014 and 08/11/2014) R hilar  and subcarinal lymph nodes: stable  (d) most recent CT of the chest:03/25/2018, stable  (e) CT Angie 12/12/2018 stable  (8) additional problems:  (a) hepatic steatosis  (b) COPD/ emphysema/ asthma  (c) advanced L hip osteoarthritis, status post left total hip replacement 06/07/2015  (d) aortoiliac atherosclerosis  (e) dental evaluation pending w possible dental extractions  (f) likely thalassemia trait  (g) hyperlipidemia  (9) Bone density concerns: DEXA scan 08/11/2013 was normal  (a) repeat DEXA scan 04/09/2018 shows a T score of -1.8, osteopenia  (10) progression of bony disease:  (a) PET scan 12/22/2018 shows no hypermetabolic soft tissue metastases but multifocal bony metastasis  (b) denosumab/Xgeva to start on 02/25/2019, repeated every 28 days (delayed due to dental extraction healing issues)   PLAN: Dashia is doing well today.  Her CBC today is stable.  Unfortunately, due to the delayed healing from her dental extraction, we cannot give her Delton See today.  I requested she get evaluated by her oral surgeon/dentist and receive clearance from them prior to starting the Xgeva.  I reviewed with her the risk of  osteonecrosis/continued delayed healing if we start today.  She is in agreement with this.  Tona will continue on Letrozoel/everolimus.  She is tolerating this well.  She has no clinical signs of disease progression.    Aleksandra is due for left breast screening mammogram in 03/2019.  I placed orders for this today.  Miosotis was counseled on healthy diet and exercise today.  She was recommended to continue with COVID-19 pandemic precautions of social distancing/mask wearing.    Argie will return on 02/25/2019 and we will evaluate whether or not we can start the Xgeva at that point.  She knows to call for any questions or concerns prior to her next appointment with [place].  A total of (30) minutes of face-to-face time was spent with this patient with greater than 50% of that time in  counseling and care-coordination.   Wilber Bihari, NP Medical Oncology and Hematology [facility] [address] [place], [place] [address] Tel. [phone]    Fax. [phone]

## 2019-01-28 NOTE — Telephone Encounter (Signed)
I talk with patient regarding schedule  

## 2019-02-01 ENCOUNTER — Ambulatory Visit: Payer: Medicare Other | Admitting: Oncology

## 2019-02-01 ENCOUNTER — Other Ambulatory Visit: Payer: Medicare Other

## 2019-02-15 ENCOUNTER — Other Ambulatory Visit: Payer: Medicare Other

## 2019-02-17 ENCOUNTER — Ambulatory Visit: Payer: Medicare Other | Admitting: Oncology

## 2019-02-25 ENCOUNTER — Inpatient Hospital Stay: Payer: Medicare Other | Admitting: *Deleted

## 2019-02-25 ENCOUNTER — Inpatient Hospital Stay (HOSPITAL_BASED_OUTPATIENT_CLINIC_OR_DEPARTMENT_OTHER): Payer: Medicare Other | Admitting: Adult Health

## 2019-02-25 ENCOUNTER — Encounter: Payer: Medicare Other | Admitting: *Deleted

## 2019-02-25 ENCOUNTER — Other Ambulatory Visit: Payer: Self-pay

## 2019-02-25 ENCOUNTER — Inpatient Hospital Stay: Payer: Medicare Other

## 2019-02-25 ENCOUNTER — Encounter: Payer: Self-pay | Admitting: Adult Health

## 2019-02-25 VITALS — BP 97/64 | HR 92 | Temp 98.2°F | Resp 18 | Ht 64.5 in | Wt 126.9 lb

## 2019-02-25 DIAGNOSIS — C50911 Malignant neoplasm of unspecified site of right female breast: Secondary | ICD-10-CM

## 2019-02-25 DIAGNOSIS — C7800 Secondary malignant neoplasm of unspecified lung: Secondary | ICD-10-CM

## 2019-02-25 DIAGNOSIS — C50411 Malignant neoplasm of upper-outer quadrant of right female breast: Secondary | ICD-10-CM

## 2019-02-25 DIAGNOSIS — Z803 Family history of malignant neoplasm of breast: Secondary | ICD-10-CM | POA: Diagnosis not present

## 2019-02-25 DIAGNOSIS — Z87891 Personal history of nicotine dependence: Secondary | ICD-10-CM | POA: Diagnosis not present

## 2019-02-25 DIAGNOSIS — Z006 Encounter for examination for normal comparison and control in clinical research program: Secondary | ICD-10-CM | POA: Diagnosis not present

## 2019-02-25 DIAGNOSIS — M8580 Other specified disorders of bone density and structure, unspecified site: Secondary | ICD-10-CM | POA: Diagnosis not present

## 2019-02-25 DIAGNOSIS — Z833 Family history of diabetes mellitus: Secondary | ICD-10-CM | POA: Diagnosis not present

## 2019-02-25 DIAGNOSIS — C801 Malignant (primary) neoplasm, unspecified: Secondary | ICD-10-CM

## 2019-02-25 DIAGNOSIS — Z9011 Acquired absence of right breast and nipple: Secondary | ICD-10-CM | POA: Diagnosis not present

## 2019-02-25 DIAGNOSIS — C7951 Secondary malignant neoplasm of bone: Secondary | ICD-10-CM

## 2019-02-25 DIAGNOSIS — Z9221 Personal history of antineoplastic chemotherapy: Secondary | ICD-10-CM | POA: Diagnosis not present

## 2019-02-25 DIAGNOSIS — Z170 Estrogen receptor positive status [ER+]: Secondary | ICD-10-CM | POA: Diagnosis not present

## 2019-02-25 DIAGNOSIS — E119 Type 2 diabetes mellitus without complications: Secondary | ICD-10-CM | POA: Diagnosis not present

## 2019-02-25 DIAGNOSIS — I3131 Malignant pericardial effusion in diseases classified elsewhere: Secondary | ICD-10-CM

## 2019-02-25 DIAGNOSIS — Z79811 Long term (current) use of aromatase inhibitors: Secondary | ICD-10-CM | POA: Diagnosis not present

## 2019-02-25 DIAGNOSIS — Z794 Long term (current) use of insulin: Secondary | ICD-10-CM | POA: Diagnosis not present

## 2019-02-25 LAB — CBC WITH DIFFERENTIAL/PLATELET
Abs Immature Granulocytes: 0.04 10*3/uL (ref 0.00–0.07)
Basophils Absolute: 0.1 10*3/uL (ref 0.0–0.1)
Basophils Relative: 1 %
Eosinophils Absolute: 0.1 10*3/uL (ref 0.0–0.5)
Eosinophils Relative: 1 %
HCT: 46.2 % — ABNORMAL HIGH (ref 36.0–46.0)
Hemoglobin: 15 g/dL (ref 12.0–15.0)
Immature Granulocytes: 0 %
Lymphocytes Relative: 13 %
Lymphs Abs: 1.2 10*3/uL (ref 0.7–4.0)
MCH: 26.3 pg (ref 26.0–34.0)
MCHC: 32.5 g/dL (ref 30.0–36.0)
MCV: 80.9 fL (ref 80.0–100.0)
Monocytes Absolute: 0.9 10*3/uL (ref 0.1–1.0)
Monocytes Relative: 10 %
Neutro Abs: 6.9 10*3/uL (ref 1.7–7.7)
Neutrophils Relative %: 75 %
Platelets: 302 10*3/uL (ref 150–400)
RBC: 5.71 MIL/uL — ABNORMAL HIGH (ref 3.87–5.11)
RDW: 14.2 % (ref 11.5–15.5)
WBC: 9.3 10*3/uL (ref 4.0–10.5)
nRBC: 0 % (ref 0.0–0.2)

## 2019-02-25 LAB — COMPREHENSIVE METABOLIC PANEL
ALT: 13 U/L (ref 0–44)
AST: 21 U/L (ref 15–41)
Albumin: 3.9 g/dL (ref 3.5–5.0)
Alkaline Phosphatase: 165 U/L — ABNORMAL HIGH (ref 38–126)
Anion gap: 13 (ref 5–15)
BUN: 25 mg/dL — ABNORMAL HIGH (ref 8–23)
CO2: 20 mmol/L — ABNORMAL LOW (ref 22–32)
Calcium: 10.7 mg/dL — ABNORMAL HIGH (ref 8.9–10.3)
Chloride: 107 mmol/L (ref 98–111)
Creatinine, Ser: 1.08 mg/dL — ABNORMAL HIGH (ref 0.44–1.00)
GFR calc Af Amer: >60 mL/min (ref >60)
GFR calc non Af Amer: 53 mL/min — ABNORMAL LOW (ref >60)
Glucose, Bld: 116 mg/dL — ABNORMAL HIGH (ref 70–99)
Potassium: 4.2 mmol/L (ref 3.5–5.1)
Sodium: 140 mmol/L (ref 135–145)
Total Bilirubin: 0.2 mg/dL — ABNORMAL LOW (ref 0.3–1.2)
Total Protein: 8.3 g/dL — ABNORMAL HIGH (ref 6.5–8.1)

## 2019-02-25 MED ORDER — DENOSUMAB 120 MG/1.7ML ~~LOC~~ SOLN
SUBCUTANEOUS | Status: AC
  Filled 2019-02-25: qty 1.7

## 2019-02-25 MED ORDER — TRAMADOL HCL 50 MG PO TABS: 25.0000 mg | ORAL_TABLET | Freq: Four times a day (QID) | ORAL | 0 refills | Status: DC | PRN

## 2019-02-25 MED ORDER — DENOSUMAB 120 MG/1.7ML ~~LOC~~ SOLN
120.0000 mg | Freq: Once | SUBCUTANEOUS | Status: AC
  Administered 2019-02-25: 09:00:00 120 mg via SUBCUTANEOUS

## 2019-02-25 NOTE — Patient Instructions (Signed)
Denosumab injection What is this medicine? DENOSUMAB (den oh sue mab) slows bone breakdown. Prolia is used to treat osteoporosis in women after menopause and in men, and in people who are taking corticosteroids for 6 months or more. Xgeva is used to treat a high calcium level due to cancer and to prevent bone fractures and other bone problems caused by multiple myeloma or cancer bone metastases. Xgeva is also used to treat giant cell tumor of the bone. This medicine may be used for other purposes; ask your health care provider or pharmacist if you have questions. COMMON BRAND NAME(S): Prolia, XGEVA What should I tell my health care provider before I take this medicine? They need to know if you have any of these conditions:  dental disease  having surgery or tooth extraction  infection  kidney disease  low levels of calcium or Vitamin D in the blood  malnutrition  on hemodialysis  skin conditions or sensitivity  thyroid or parathyroid disease  an unusual reaction to denosumab, other medicines, foods, dyes, or preservatives  pregnant or trying to get pregnant  breast-feeding How should I use this medicine? This medicine is for injection under the skin. It is given by a health care professional in a hospital or clinic setting. A special MedGuide will be given to you before each treatment. Be sure to read this information carefully each time. For Prolia, talk to your pediatrician regarding the use of this medicine in children. Special care may be needed. For Xgeva, talk to your pediatrician regarding the use of this medicine in children. While this drug may be prescribed for children as young as 13 years for selected conditions, precautions do apply. Overdosage: If you think you have taken too much of this medicine contact a poison control center or emergency room at once. NOTE: This medicine is only for you. Do not share this medicine with others. What if I miss a dose? It is  important not to miss your dose. Call your doctor or health care professional if you are unable to keep an appointment. What may interact with this medicine? Do not take this medicine with any of the following medications:  other medicines containing denosumab This medicine may also interact with the following medications:  medicines that lower your chance of fighting infection  steroid medicines like prednisone or cortisone This list may not describe all possible interactions. Give your health care provider a list of all the medicines, herbs, non-prescription drugs, or dietary supplements you use. Also tell them if you smoke, drink alcohol, or use illegal drugs. Some items may interact with your medicine. What should I watch for while using this medicine? Visit your doctor or health care professional for regular checks on your progress. Your doctor or health care professional may order blood tests and other tests to see how you are doing. Call your doctor or health care professional for advice if you get a fever, chills or sore throat, or other symptoms of a cold or flu. Do not treat yourself. This drug may decrease your body's ability to fight infection. Try to avoid being around people who are sick. You should make sure you get enough calcium and vitamin D while you are taking this medicine, unless your doctor tells you not to. Discuss the foods you eat and the vitamins you take with your health care professional. See your dentist regularly. Brush and floss your teeth as directed. Before you have any dental work done, tell your dentist you are   receiving this medicine. Do not become pregnant while taking this medicine or for 5 months after stopping it. Talk with your doctor or health care professional about your birth control options while taking this medicine. Women should inform their doctor if they wish to become pregnant or think they might be pregnant. There is a potential for serious side  effects to an unborn child. Talk to your health care professional or pharmacist for more information. What side effects may I notice from receiving this medicine? Side effects that you should report to your doctor or health care professional as soon as possible:  allergic reactions like skin rash, itching or hives, swelling of the face, lips, or tongue  bone pain  breathing problems  dizziness  jaw pain, especially after dental work  redness, blistering, peeling of the skin  signs and symptoms of infection like fever or chills; cough; sore throat; pain or trouble passing urine  signs of low calcium like fast heartbeat, muscle cramps or muscle pain; pain, tingling, numbness in the hands or feet; seizures  unusual bleeding or bruising  unusually weak or tired Side effects that usually do not require medical attention (report to your doctor or health care professional if they continue or are bothersome):  constipation  diarrhea  headache  joint pain  loss of appetite  muscle pain  runny nose  tiredness  upset stomach This list may not describe all possible side effects. Call your doctor for medical advice about side effects. You may report side effects to FDA at 1-800-FDA-1088. Where should I keep my medicine? This medicine is only given in a clinic, doctor's office, or other health care setting and will not be stored at home. NOTE: This sheet is a summary. It may not cover all possible information. If you have questions about this medicine, talk to your doctor, pharmacist, or health care provider.  2020 Elsevier/Gold Standard (2017-11-21 16:10:44)

## 2019-02-25 NOTE — Progress Notes (Signed)
[place]  Telephone:([phone]) [phone] [phone]) [phone]    ID: EDNAMAE SCHIANO DOB: [birth year]  MR#: [id number]  TOI#:[id number]  Patient Care Team: [facility] as PCP - General (Family Medicine) Jerline Pain, MD (Cardiology) Gaye Pollack, MD (Cardiothoracic Surgery) Magrinat, Virgie Dad, MD as Consulting Physician (Oncology) OTHER MD: Dr Erroll Luna- Merlene Laughter, DDS; Gaynelle Arabian MD   CHIEF COMPLAINT: metastatic breast cancer, on BOLERO study  CURRENT TREATMENT: letrozole + everolimus   INTERVAL HISTORY: Sharaya returns today for follow-up and treatment of her metastatic estrogen receptor positive breast cancer.  She continues on letrozole. She tolerates this well.    She also continues on everolimus.  Her last dose of study drug was on 01/26/2019.  She has continued on this daily.    Her last PET scan on 12/22/2018 there was evidence for increasing bone lesions. She has pain in her ribs bilaterally where the hypermetabolism was seen.  She has not yet started xgeva due to delayed healing from a dental extraction, however that has resolved, and she has received dental clearance to start this.    REVIEW OF SYSTEMS: Sanari's weight is stable.  She describes her rib pain as bilateral.  It feels like spasms.  She notes the pain is all of the time, and will vary in severity.  Doing to much makes the pain worse, and will bring out intense spasms.  She has to be cautious with her activity level.  She was taking advil for the pain, however, she notes it has been causing GI upset recently.    Cadi denies any new issues, fever, chills, chest pain, palpitations, cough, shortness of breath.  She is without any bowel/bladder changes, dysphagia, mucositis.  A detailed ROS was otherwise non contributory today.    BREAST CANCER HISTORY: From Dr. Collier [name]'s original intake note 04/13/2004:  "This woman has been in good health all of her life. She recently  moved from [place] to work here.  She palpated a mass at about the 12 oclock position in mid-July. She has not noticed any nipple retraction or skin changes.  She was seen by her primary care doctor who subsequently referred her for a mammogram.  Mammogram was performed on 03/01/04. This demonstrated a spiculated 2 cm mass at the 12 oclock position in the right breast.  Upper outer quadrant of the left breast shows some distortion.   Physical exam at that time showed a firm, nontender nodule at the 12 oclock position in the right breast, 5 cm from the nipple.  Ultrasound of this area showed a hypoechoic ill-defined mass, measuring 2.2 x 1.3 x 1.4 cm.  Physical exam of the left breast showed general vague thickening, upper outer quadrant of the left breast with a discrete palpable mass. The ultrasound performed showed a single hypoechoic ill-defined nodule at the 1 oclock position, measuring 7 x 5 x 6 mm.    She had biopsies of both lesions on 03/02/03.  Needle core biopsy of the lesion on the right breast revealed invasive mammary carcinoma.  Needle core biopsy of the left breast showed a complex fibroadenoma.  Prognostic panel of the lesion on the right breast showed it to be ER positive at 73%, PR positive at 90% and proliferative index 9%, HER-2 was 1+.  Patient was referred to Dr. Annamaria Boots, who performed a simple mastectomy with sentinel lymph node evaluation on 03/22/04.  Final pathology showed this to be an invasive ductal carcinoma  with  lobular features, measuring 2.2 cm, grade 2 of 3.  Margins negative for carcinoma.  Invasive ductal carcinoma was extended to involve deep dermis of the nipple.  Lymphovascular invasion was identified.  Total of 4 sentinel lymph nodes were evaluated.  Touch imprints at the time of the OR was felt to be negative.  Subsequent evaluation showed a 5 mm focus of metastatic carcinoma in one of the four lymph nodes on microscopic after sectioning.  There was extracapsular  extension  of one lymph node as well. The remaining three lymph nodes were all negative."  The patient's subsequent history is detailed above.   PAST MEDICAL HISTORY: Past Medical History:  Diagnosis Date   Arthritis    left hip   Asthma    breast ca 2005   breast/chemo R mastectomy   Dermatitis    Diabetes mellitus without complication ([place])    GERD (gastroesophageal reflux disease)    Hypercholesteremia    Metastasis to lung ([place]) dx'd 08/2011   Osteopenia due to cancer therapy 09/09/2013   Palpitations    Personal history of chemotherapy    Shortness of breath     PAST SURGICAL HISTORY: Past Surgical History:  Procedure Laterality Date   BREAST SURGERY  2005   right   CATARACT EXTRACTION W/PHACO Left 01/18/2014   Procedure: CATARACT EXTRACTION PHACO AND INTRAOCULAR LENS PLACEMENT ([place]);  Surgeon: Elta Guadeloupe T. Gershon Crane, MD;  Location: [facility];  Service: Ophthalmology;  Laterality: Left;  CDE 15.79   CATARACT EXTRACTION W/PHACO Right 02/08/2014   Procedure: CATARACT EXTRACTION PHACO AND INTRAOCULAR LENS PLACEMENT ([facility]);  Surgeon: Elta Guadeloupe T. Gershon Crane, MD;  Location: [facility];  Service: Ophthalmology;  Laterality: Right;  CDE 4.41   CHEST TUBE INSERTION  09/11/2011   Procedure: INSERTION PLEURAL DRAINAGE CATHETER;  Surgeon: Gaye Pollack, MD;  Location: [facility];  Service: Thoracic;  Laterality: Right;   MASTECTOMY Right    PERICARDIAL WINDOW  09/11/2011   Procedure: PERICARDIAL WINDOW;  Surgeon: Gaye Pollack, MD;  Location: [facility];  Service: Thoracic;  Laterality: N/A;   REMOVAL OF PLEURAL DRAINAGE CATHETER  12/19/2011   Procedure: REMOVAL OF PLEURAL DRAINAGE CATHETER;  Surgeon: Gaye Pollack, MD;  Location: [address];  Service: Thoracic;  Laterality: Right;  TO BE DONE IN MINOR ROOM, [place] Left 06/07/2015   Procedure: LEFT TOTAL HIP ARTHROPLASTY ANTERIOR APPROACH;  Surgeon: Gaynelle Arabian, MD;  Location: [facility];  Service:  Orthopedics;  Laterality: Left;   VIDEO BRONCHOSCOPY  09/11/2011   Procedure: VIDEO BRONCHOSCOPY;  Surgeon: Gaye Pollack, MD;  Location: [facility];  Service: Thoracic;  Laterality: N/A;    FAMILY HISTORY Family History  Problem Relation Age of Onset   Cancer Mother        breast   Heart disease Father    Diabetes Other    Anesthesia problems Neg Hx    The patient's mother was diagnosed with breast cancer at age 37, she died age 56 from congestive heart failure.The patient's father died from heart disease at age 55.  She has one sister alive & well.  Two brothers alive & well, one with diabetes. The patient's sister was also diagnosed with breast cancer, and was tested for the BRCA gene, and was negative. The patient herself has not been tested. There is no history of ovarian cancer in the family   GYNECOLOGIC HISTORY:  No LMP recorded. Patient is postmenopausal. Menarche age 13,  the patient is GX P0. She stopped having periods with chemotherapy in 2005. 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 She was a Archivist. She is single, lives alone with her Shitzu-poodle Sammie.. Family is all in the [place] area.     ADVANCED DIRECTIVES: Not in place. At the 02/23/2014 visit the patient was given the appropriate documents to complete and notarize at her discretion. She tells me she is planning to name her sister, Billie Ruddy, as healthcare power of attorney. [name] can be reached at [phone]   HEALTH MAINTENANCE: Social History   Tobacco Use   Smoking status: Former Smoker    Packs/day: 1.50    Years: 30.00    Pack years: 45.00    Types: Cigarettes    Quit date: 09/10/2007    Years since quitting: 11.4   Smokeless tobacco: Never Used  Substance Use Topics   Alcohol use: No   Drug use: No    Colonoscopy:  PAP:  Bone density: 04/09/2018, T score  -1.8  Lipid panel:  Allergies  Allergen Reactions   Aspirin     REACTION: upset stomach   Latex Other (See Comments)    Blistering and skin peels off   Nsaids Nausea And Vomiting    Extreme nausea and vomiting    Current Outpatient Medications  Medication Sig Dispense Refill   blood glucose meter kit and supplies KIT Dispense based on patient and insurance preference. Use up to four times daily as directed. (FOR ICD-9 250.00, 250.01). 1 each 0   cholecalciferol (VITAMIN D3) 25 MCG (1000 UT) tablet Take 2 tablets (2,000 Units total) by mouth daily. 180 tablet 4   cyclobenzaprine (FLEXERIL) 10 MG tablet Take 1 tablet (10 mg total) by mouth 2 (two) times daily as needed for muscle spasms. 20 tablet 0   everolimus (AFINITOR) 10 MG tablet Take 1 tablet (10 mg total) by mouth daily. 28 tablet 6   gemfibrozil (LOPID) 600 MG tablet TAKE 1 TABLET (600 MG TOTAL) BY MOUTH 2 (TWO) TIMES DAILY BEFORE A MEAL. 180 tablet 1   letrozole (FEMARA) 2.5 MG tablet Take 1 tablet (2.5 mg total) by mouth daily. 90 tablet 3   loperamide (IMODIUM) 2 MG capsule Take 2 mg by mouth 4 (four) times daily as needed for diarrhea or loose stools. Reported on 11/30/2015     metFORMIN (GLUCOPHAGE-XR) 500 MG 24 hr tablet Take 1 tablet (500 mg total) by mouth 3 (three) times daily before meals.  3   No current facility-administered medications for this visit.     OBJECTIVE:  Vitals:   02/25/19 0828  BP: 97/64  Pulse: 92  Resp: 18  Temp: 98.2 F (36.8 C)  SpO2: 100%     Body mass index is 21.45 kg/m.   Filed Weights   02/25/19 0828  Weight: 126 lb 14.4 oz (57.6 kg)  Weight was 153 pounds June 2019 (down 30 pounds) ECOG FS:1 - Symptomatic but completely ambulatory GENERAL: Patient is a well appearing female in no acute distress HEENT:  Sclerae anicteric.  Oropharynx clear and moist. No ulcerations or evidence of oropharyngeal candidiasis. Neck is supple.  NODES:  No cervical, supraclavicular, or  axillary lymphadenopathy palpated.  BREAST EXAM:  Right breast s/p mastectomy, no sign of recurrence, left breast benign LUNGS:  Clear to auscultation bilaterally.  No wheezes or rhonchi. HEART:  Regular  rate and rhythm. No murmur appreciated. ABDOMEN:  Soft, nontender.  Positive, normoactive bowel sounds. No organomegaly palpated. MSK:  No focal spinal tenderness to palpation. Full range of motion bilaterally in the upper extremities. EXTREMITIES:  No peripheral edema.   SKIN:  Clear with no obvious rashes or skin changes. No nail dyscrasia. NEURO:  Nonfocal. Well oriented.  Appropriate affect.     LAB  RESULTS:  CMP     Component Value Date/Time   NA 140 02/25/2019 0807   NA 139 07/08/2017 0942   K 4.2 02/25/2019 0807   K 3.8 07/08/2017 0942   CL 107 02/25/2019 0807   CL 109 (H) 12/31/2012 0940   CO2 20 (L) 02/25/2019 0807   CO2 18 (L) 07/08/2017 0942   GLUCOSE 116 (H) 02/25/2019 0807   GLUCOSE 156 (H) 07/08/2017 0942   GLUCOSE 127 (H) 12/31/2012 0940   BUN 25 (H) 02/25/2019 0807   BUN 17.7 07/08/2017 0942   CREATININE 1.08 (H) 02/25/2019 0807   CREATININE 1.18 (H) 12/25/2017 0959   CREATININE 1.0 07/08/2017 0942   CALCIUM 10.7 (H) 02/25/2019 0807   CALCIUM 10.4 07/08/2017 0942   PROT 8.3 (H) 02/25/2019 0807   PROT 7.7 07/08/2017 0942   ALBUMIN 3.9 02/25/2019 0807   ALBUMIN 3.4 (L) 07/08/2017 0942   AST 21 02/25/2019 0807   AST 50 (H) 12/25/2017 0959   AST 54 (H) 07/08/2017 0942   ALT 13 02/25/2019 0807   ALT 53 12/25/2017 0959   ALT 39 07/08/2017 0942   ALKPHOS 165 (H) 02/25/2019 0807   ALKPHOS 232 (H) 07/08/2017 0942   BILITOT 0.2 (L) 02/25/2019 0807   BILITOT 0.3 12/25/2017 0959   BILITOT 0.42 07/08/2017 0942   GFRNONAA 53 (L) 02/25/2019 0807   GFRNONAA 47 (L) 12/25/2017 0959   GFRAA >60 02/25/2019 0807   GFRAA 54 (L) 12/25/2017 0959    I No results found for: SPEP  Lab Results  Component Value Date   WBC 9.3 02/25/2019   NEUTROABS 6.9 02/25/2019    HGB 15.0 02/25/2019   HCT 46.2 (H) 02/25/2019   MCV 80.9 02/25/2019   PLT 302 02/25/2019      Chemistry      Component Value Date/Time   NA 140 02/25/2019 0807   NA 139 07/08/2017 0942   K 4.2 02/25/2019 0807   K 3.8 07/08/2017 0942   CL 107 02/25/2019 0807   CL 109 (H) 12/31/2012 0940   CO2 20 (L) 02/25/2019 0807   CO2 18 (L) 07/08/2017 0942   BUN 25 (H) 02/25/2019 0807   BUN 17.7 07/08/2017 0942   CREATININE 1.08 (H) 02/25/2019 0807   CREATININE 1.18 (H) 12/25/2017 0959   CREATININE 1.0 07/08/2017 0942      Component Value Date/Time   CALCIUM 10.7 (H) 02/25/2019 0807   CALCIUM 10.4 07/08/2017 0942   ALKPHOS 165 (H) 02/25/2019 0807   ALKPHOS 232 (H) 07/08/2017 0942   AST 21 02/25/2019 0807   AST 50 (H) 12/25/2017 0959   AST 54 (H) 07/08/2017 0942   ALT 13 02/25/2019 0807   ALT 53 12/25/2017 0959   ALT 39 07/08/2017 0942   BILITOT 0.2 (L) 02/25/2019 0807   BILITOT 0.3 12/25/2017 0959   BILITOT 0.42 07/08/2017 0942       Lab Results  Component Value Date   LABCA2 58 (H) 09/14/2012    No components found for: [id number]  No results for input(s): INR in the last 168 hours.  Urinalysis  Component Value Date/Time   COLORURINE YELLOW 11/25/2017 0901   APPEARANCEUR HAZY (A) 11/25/2017 0901   LABSPEC 1.017 11/25/2017 0901   LABSPEC 1.030 07/13/2015 0841   PHURINE 5.0 11/25/2017 0901   GLUCOSEU NEGATIVE 11/25/2017 0901   GLUCOSEU Negative 07/13/2015 0841   HGBUR MODERATE (A) 11/25/2017 0901   BILIRUBINUR NEGATIVE 11/25/2017 0901   BILIRUBINUR Negative 07/13/2015 0841   KETONESUR NEGATIVE 11/25/2017 0901   PROTEINUR 100 (A) 11/25/2017 0901   UROBILINOGEN 0.2 07/13/2015 0841   NITRITE NEGATIVE 11/25/2017 0901   LEUKOCYTESUR NEGATIVE 11/25/2017 0901   LEUKOCYTESUR Negative 07/13/2015 0841    STUDIES: No results found.  ASSESSMENT: 68 y.o. [place], [place] woman with stage IV breast cancer, on BOLERO-4 trial  (1) status post right mastectomy and sentinel lymph  node sampling 03/22/2004 for a right upper-outer quadrant pT2 pN1, stage IIB invasive ductal carcinoma with lobular features, grade 2, estrogen receptor and progesterone receptor positive, HER-2 negative, with an MIB-1 of 9% ([id number] and [id number])  (2) addition all right axillary lymph node sampling 05/07/2004 showed 2 benign lymph nodes (4 lymph nodes previously removed, so total was one positive lymph node out of 6; [id number])  (3) the patient was evaluated by radiation oncology; no postmastectomy radiation recommended  (4) adjuvant chemotherapy with dose dense doxorubicin and cyclophosphamide x4 cycles (first cycle delayed one week) followed by dose dense paclitaxel x4 was completed 09/18/2004  (5) tamoxifen started March 2006, discontinued 2009  METASTATIC DISEASE: February 2013 (6) presenting with a large pericardial effusion, large right pleural effusion and possible right middle lobe bronchial obstruction February 2013, status post pericardial window placement, fiberoptic bronchoscopy and right Pleurx placement 09/11/2011, with biopsy of the bronchus intermedius and pericardium positive for metastatic breast cancer, estrogen receptor 91% positive with moderate staining intensity, progesterone receptor 100% positive with strong staining intensity, with an MIB-1 of 35%, and no HER-2 amplification, the signals ratio being 1.37 (SZA [id number])  (7) enrolled in BOLERO-4 trial 10/10/2011, receiving letrozole and everolimus  (a) two small areas of enhancement in the cerebellum noted by brain MRI 10/03/2011 were no longer apparent on repeat MRI 08/21/2012-- most recent brain MRI 04/01/2014 showed no evidence of intracranial metastatic disease  (b) sclerotic lesions in left iliac bone and sacrum have not been biopsied; stable; plan is to start zolendronate after patient updates her dental care (extraction planned)  (c) RLL lung nodule, stable (rescanned 07/12/2014 and 08/11/2014) R hilar and subcarinal  lymph nodes: stable  (d) most recent CT of the chest:03/25/2018, stable  (e) CT Angie 12/12/2018 stable  (8) additional problems:  (a) hepatic steatosis  (b) COPD/ emphysema/ asthma  (c) advanced L hip osteoarthritis, status post left total hip replacement 06/07/2015  (d) aortoiliac atherosclerosis  (e) dental evaluation pending w possible dental extractions  (f) likely thalassemia trait  (g) hyperlipidemia  (9) Bone density concerns: DEXA scan 08/11/2013 was normal  (a) repeat DEXA scan 04/09/2018 shows a T score of -1.8, osteopenia  (10) progression of bony disease:  (a) PET scan 12/22/2018 shows no hypermetabolic soft tissue metastases but multifocal bony metastasis  (b) denosumab/Xgeva to start on 02/25/2019, repeated every 28 days (delayed due to dental extraction healing issues)   PLAN: Treazure is doing moderately well today.  The pain in her ribs are in the areas of known breast cancer involvement.  She will receive Xgeva today which as she receives this will hopefully help.  We reviewed calcium intake with Xgeva.  We will monitor her calcium level.  She  will continue on Letrozole and Everolimus.  She is tolerating it well and has no clinical signs of progression today.    We talked about her pain control and nausea with advil.  I recommended she try one aleve and one extra strength tylenol as much as TID.  Skyah has taken Tramadol in the past and tolerated it well.  We reviewed that the goal of pain control is for her pain to be at a level where she can function as she previously did, or close to it, and not be increasingly sleepy.   I wrote for her to take 1/2 to 1 tab every 6 hours as needed for pain.  She plans on trying this if the aleve and tylenol combination doesn't work, or causes GI upset.  PMP aware was reviewed, no recent controlled substance scripts have been sent on her.    We also discussed that radiation can help with the pain control in her bones.  She does not want to  do this, unless her pain progresses.    Shaindy will call us and tell us how she is doing with the pain and if it worsens.  She will return in 4 weeks for labs, f/u, and her next injection.  She was recommended to continue with the appropriate pandemic precautions.  A total of (30) minutes of face-to-face time was spent with this patient with greater than 50% of that time in counseling and care-coordination.   Wilber Bihari, NP Medical Oncology and Hematology [facility] [address] [place], [place] [address] Tel. [phone]    Fax. [phone]

## 2019-02-25 NOTE — Research (Signed)
02/25/2019 Research - [employer] [id number] (Bolero 4) 28-day Safety Follow up visit  Patient into clinic unaccompanied today for 28-day Safety Follow up visit post End of treatment on the [employer] [id number] research study.  Adverse Events Patient reports that hot flashes are ongoing and moderate in severity. Patient reports ongoing rib pain (right thoracic region) and associated back spasms which have been intense over the past 7 days. She has also developed left-sided rib (thoracic) pain over the past 7 days. She has taking ibuprofen for this. She reports onset of GI upset/abdominal discomfort with the first dose of ibuprofen and it is ongoing. Patient reports that her jaw pain resolved by 02/11/2019 and she has been cleared by her dentist to resume treatment with Xgeva.  Antineoplastic therapies since discontinuation of study treatment Patient confirms successful transition to [employer] Patient Assistance program for supply of commercial Afinitor which she continues to take as prescribed. Patient also continues on letrozole daily.  Patient is aware that this concludes her participation in the research study and that she may be contacted by the research nurse for additional information for end of study reporting, if needed. Thanked patient for her long participation in this trial.  Cindy S. Brigitte Pulse BSN, RN, CCRP 02/25/2019 1:30 PM

## 2019-02-26 ENCOUNTER — Encounter: Payer: Self-pay | Admitting: *Deleted

## 2019-02-26 LAB — CANCER ANTIGEN 27.29: CA 27.29: 264.8 U/mL — ABNORMAL HIGH (ref 0.0–38.6)

## 2019-02-26 NOTE — Progress Notes (Signed)
02/26/19 at 11:52pm - [employer]/Bolero 4 (28 day safety follow up visit)- [name] was seen on 02/25/19 for her 28 day safety follow up visit. [name] was seen and examined by Wilber Bihari, NP.   The research nurse called [name] today to follow up with her regarding her end of the study AE's.  [name] reports the following AE's as ongoing:  fatigue, dry skin, nail changes (brittle nails), back pain, memory impairment, and blurred vision.  [name] said that for the past week she has developed "heartburn-like" pain after she took her ibuprofen.  [name]'s dyspepsia (referred to as nausea and abdominal discomfort) were felt to not be study drug related.  [name]'s dyspepsia was felt to be related to her ibuprofen usage.    Adverse Event Log Protocol: [employer] [id number] AE date range7/3/20-7/30/20 Ongoing and unchanged in severity:fatigue,nail changes, memory impairment, dry skin,blurred vision,back pain and osteopenia "Not clinically significant" abnormal labs not required by the study to be reported as AE's are the following: alkaline phosphatase, BUN, creatinine increased, proteinuria (urine), carbon dioxide, calcium, total protein, total bilirubin, RBC and HCT(no interventions required for the above abnormal lab values) Event Grade Onset Date Resolved Date Attribution to letrozole# Attribution to everolimus#  Hyperglycemia  1 01/04/2019 Ongoing No-Not Suspected Yes-Suspected  Hypertriglyceridemia 1 08/31/2018 Ongoing No- Not Suspected Yes-Suspected  Cholesterol High 1 08/31/2018 Ongoing Yes-Suspected Yes-Suspected  Hot Flashes  2 01/04/2019 Ongoing No-NotSuspected Yes-Suspected  Weight Loss 3 06/11/18 Ongoing No-Not Suspected No-Not Suspected  Myalgia  2 ~10/21/2018 Ongoing  No-Not Suspected  No-Not Suspected  Rib pain  2 12/12/2018 Ongoing No-Not Suspected No- Not Suspected  Jaw pain 2 01/28/2019 02/11/2019 No-Not Suspected No-Not Suspected  Dyspepsia 2 ~02/18/2019 Ongoing No- Not Suspected No- Not Suspected    Brion Aliment RN, BSN, CCRP Clinical Research Nurse 02/26/2019 11:56 AM

## 2019-03-24 ENCOUNTER — Telehealth: Payer: Self-pay | Admitting: Adult Health

## 2019-03-24 NOTE — Telephone Encounter (Signed)
Called pt per 8/25 sch message - unable to reach pt . Left message with new appt date and time

## 2019-03-25 ENCOUNTER — Ambulatory Visit: Payer: Medicare Other | Admitting: Adult Health

## 2019-03-25 ENCOUNTER — Other Ambulatory Visit: Payer: Medicare Other

## 2019-03-25 ENCOUNTER — Ambulatory Visit: Payer: Medicare Other

## 2019-03-26 ENCOUNTER — Encounter: Payer: Self-pay | Admitting: Adult Health

## 2019-03-26 ENCOUNTER — Inpatient Hospital Stay (HOSPITAL_BASED_OUTPATIENT_CLINIC_OR_DEPARTMENT_OTHER): Payer: Medicare Other | Admitting: Adult Health

## 2019-03-26 ENCOUNTER — Inpatient Hospital Stay: Payer: Medicare Other

## 2019-03-26 ENCOUNTER — Inpatient Hospital Stay: Payer: Medicare Other | Attending: Oncology

## 2019-03-26 ENCOUNTER — Other Ambulatory Visit: Payer: Self-pay

## 2019-03-26 VITALS — BP 114/64 | HR 103 | Temp 98.3°F | Resp 17 | Ht 64.5 in | Wt 126.8 lb

## 2019-03-26 DIAGNOSIS — Z170 Estrogen receptor positive status [ER+]: Secondary | ICD-10-CM

## 2019-03-26 DIAGNOSIS — J449 Chronic obstructive pulmonary disease, unspecified: Secondary | ICD-10-CM | POA: Diagnosis not present

## 2019-03-26 DIAGNOSIS — R918 Other nonspecific abnormal finding of lung field: Secondary | ICD-10-CM | POA: Diagnosis not present

## 2019-03-26 DIAGNOSIS — Z9011 Acquired absence of right breast and nipple: Secondary | ICD-10-CM | POA: Diagnosis not present

## 2019-03-26 DIAGNOSIS — C50811 Malignant neoplasm of overlapping sites of right female breast: Secondary | ICD-10-CM | POA: Insufficient documentation

## 2019-03-26 DIAGNOSIS — C50411 Malignant neoplasm of upper-outer quadrant of right female breast: Secondary | ICD-10-CM

## 2019-03-26 DIAGNOSIS — C801 Malignant (primary) neoplasm, unspecified: Secondary | ICD-10-CM

## 2019-03-26 DIAGNOSIS — I3131 Malignant pericardial effusion in diseases classified elsewhere: Secondary | ICD-10-CM

## 2019-03-26 DIAGNOSIS — Z9221 Personal history of antineoplastic chemotherapy: Secondary | ICD-10-CM | POA: Diagnosis not present

## 2019-03-26 DIAGNOSIS — C7951 Secondary malignant neoplasm of bone: Secondary | ICD-10-CM | POA: Insufficient documentation

## 2019-03-26 DIAGNOSIS — E785 Hyperlipidemia, unspecified: Secondary | ICD-10-CM | POA: Diagnosis not present

## 2019-03-26 DIAGNOSIS — C7800 Secondary malignant neoplasm of unspecified lung: Secondary | ICD-10-CM

## 2019-03-26 DIAGNOSIS — C50911 Malignant neoplasm of unspecified site of right female breast: Secondary | ICD-10-CM

## 2019-03-26 DIAGNOSIS — Z96642 Presence of left artificial hip joint: Secondary | ICD-10-CM | POA: Insufficient documentation

## 2019-03-26 DIAGNOSIS — M1612 Unilateral primary osteoarthritis, left hip: Secondary | ICD-10-CM | POA: Diagnosis not present

## 2019-03-26 LAB — CBC WITH DIFFERENTIAL/PLATELET
Abs Immature Granulocytes: 0.02 10*3/uL (ref 0.00–0.07)
Basophils Absolute: 0.1 10*3/uL (ref 0.0–0.1)
Basophils Relative: 1 %
Eosinophils Absolute: 0.1 10*3/uL (ref 0.0–0.5)
Eosinophils Relative: 1 %
HCT: 44.2 % (ref 36.0–46.0)
Hemoglobin: 14.3 g/dL (ref 12.0–15.0)
Immature Granulocytes: 0 %
Lymphocytes Relative: 17 %
Lymphs Abs: 1.4 10*3/uL (ref 0.7–4.0)
MCH: 26.4 pg (ref 26.0–34.0)
MCHC: 32.4 g/dL (ref 30.0–36.0)
MCV: 81.7 fL (ref 80.0–100.0)
Monocytes Absolute: 1 10*3/uL (ref 0.1–1.0)
Monocytes Relative: 12 %
Neutro Abs: 6 10*3/uL (ref 1.7–7.7)
Neutrophils Relative %: 69 %
Platelets: 299 10*3/uL (ref 150–400)
RBC: 5.41 MIL/uL — ABNORMAL HIGH (ref 3.87–5.11)
RDW: 15 % (ref 11.5–15.5)
WBC: 8.7 10*3/uL (ref 4.0–10.5)
nRBC: 0 % (ref 0.0–0.2)

## 2019-03-26 LAB — COMPREHENSIVE METABOLIC PANEL
ALT: 11 U/L (ref 0–44)
AST: 22 U/L (ref 15–41)
Albumin: 3.7 g/dL (ref 3.5–5.0)
Alkaline Phosphatase: 179 U/L — ABNORMAL HIGH (ref 38–126)
Anion gap: 10 (ref 5–15)
BUN: 24 mg/dL — ABNORMAL HIGH (ref 8–23)
CO2: 21 mmol/L — ABNORMAL LOW (ref 22–32)
Calcium: 9.5 mg/dL (ref 8.9–10.3)
Chloride: 107 mmol/L (ref 98–111)
Creatinine, Ser: 1 mg/dL (ref 0.44–1.00)
GFR calc Af Amer: >60 mL/min (ref >60)
GFR calc non Af Amer: 58 mL/min — ABNORMAL LOW (ref >60)
Glucose, Bld: 85 mg/dL (ref 70–99)
Potassium: 4.8 mmol/L (ref 3.5–5.1)
Sodium: 138 mmol/L (ref 135–145)
Total Bilirubin: 0.3 mg/dL (ref 0.3–1.2)
Total Protein: 7.8 g/dL (ref 6.5–8.1)

## 2019-03-26 MED ORDER — DENOSUMAB 120 MG/1.7ML ~~LOC~~ SOLN
SUBCUTANEOUS | Status: AC
  Filled 2019-03-26: qty 1.7

## 2019-03-26 MED ORDER — DENOSUMAB 120 MG/1.7ML ~~LOC~~ SOLN
120.0000 mg | Freq: Once | SUBCUTANEOUS | Status: AC
  Administered 2019-03-26: 120 mg via SUBCUTANEOUS

## 2019-03-26 NOTE — Progress Notes (Signed)
[place]  [phone] [phone]    ID: Barbara Parks DOB: 1950/10/25  MR#: [id number]  IOX#:[id number]  Patient Care Team: [facility] as PCP - General (Family Medicine) Jerline Pain, MD (Cardiology) Gaye Pollack, MD (Cardiothoracic Surgery) Magrinat, Virgie Dad, MD as Consulting Physician (Oncology) OTHER MD: Dr Erroll Luna- Merlene Laughter, DDS; Gaynelle Arabian MD   CHIEF COMPLAINT: metastatic breast cancer, on BOLERO study  CURRENT TREATMENT: letrozole + everolimus; Delton See   INTERVAL HISTORY: Barbara Parks returns today for follow-up and treatment of her metastatic estrogen receptor positive breast cancer.  She continues on letrozole. She tolerates this well.    She also continues on everolimus.  Her last dose of study drug was on 01/26/2019.  She has continued on this daily with good tolerance.    Barbara Parks received her first dose of Xgeva 4 weeks ago.  She noted achiness the first four days after receiving it.  She took tylenol and aleve which helped.  After four days, the pain resolved and she went outside and pressure washed her house.    REVIEW OF SYSTEMS: Barbara Parks has been feeling well.  She had some issues noted above after receiving Xgeva, but is otherwise feeling well.  She is taking Calcium daily.  She denies any new issues like fever or chills.  She has no nausea, vomiting, bowel/bladder changes.  She is without cough, shortness of breath, chest pain or palpitations.  She has remained active.  A detailed ROS was otherwise non contributory.  BREAST CANCER HISTORY: From Dr. Collier [name]'s original intake note 04/13/2004:  "This woman has been in good health all of her life. She recently moved from [place] to work here.  She palpated a mass at about the 12 oclock position in mid-July. She has not noticed any nipple retraction or skin changes.  She was seen by her primary care doctor who subsequently referred her for a  mammogram.  Mammogram was performed on 03/01/04. This demonstrated a spiculated 2 cm mass at the 12 oclock position in the right breast.  Upper outer quadrant of the left breast shows some distortion.   Physical exam at that time showed a firm, nontender nodule at the 12 oclock position in the right breast, 5 cm from the nipple.  Ultrasound of this area showed a hypoechoic ill-defined mass, measuring 2.2 x 1.3 x 1.4 cm.  Physical exam of the left breast showed general vague thickening, upper outer quadrant of the left breast with a discrete palpable mass. The ultrasound performed showed a single hypoechoic ill-defined nodule at the 1 oclock position, measuring 7 x 5 x 6 mm.    She had biopsies of both lesions on 03/02/03.  Needle core biopsy of the lesion on the right breast revealed invasive mammary carcinoma.  Needle core biopsy of the left breast showed a complex fibroadenoma.  Prognostic panel of the lesion on the right breast showed it to be ER positive at 73%, PR positive at 90% and proliferative index 9%, HER-2 was 1+.  Patient was referred to Dr. Annamaria Boots, who performed a simple mastectomy with sentinel lymph node evaluation on 03/22/04.  Final pathology showed this to be an invasive ductal carcinoma  with lobular features, measuring 2.2 cm, grade 2 of 3.  Margins negative for carcinoma.  Invasive ductal carcinoma was extended to involve deep dermis of the nipple.  Lymphovascular invasion was identified.  Total of 4 sentinel lymph nodes were evaluated.  Touch imprints at  the time of the OR was felt to be negative.  Subsequent evaluation showed a 5 mm focus of metastatic carcinoma in one of the four lymph nodes on microscopic after sectioning.  There was extracapsular extension  of one lymph node as well. The remaining three lymph nodes were all negative."  The patient's subsequent history is detailed above.   PAST MEDICAL HISTORY: Past Medical History:  Diagnosis Date   Arthritis    left hip    Asthma    breast ca 2005   breast/chemo R mastectomy   Dermatitis    Diabetes mellitus without complication ([place])    GERD (gastroesophageal reflux disease)    Hypercholesteremia    Metastasis to lung ([place]) dx'd 08/2011   Osteopenia due to cancer therapy 09/09/2013   Palpitations    Personal history of chemotherapy    Shortness of breath     PAST SURGICAL HISTORY: Past Surgical History:  Procedure Laterality Date   BREAST SURGERY  2005   right   CATARACT EXTRACTION W/PHACO Left 01/18/2014   Procedure: CATARACT EXTRACTION PHACO AND INTRAOCULAR LENS PLACEMENT ([place]);  Surgeon: Elta Guadeloupe T. Gershon Crane, MD;  Location: [facility];  Service: Ophthalmology;  Laterality: Left;  CDE 15.79   CATARACT EXTRACTION W/PHACO Right 02/08/2014   Procedure: CATARACT EXTRACTION PHACO AND INTRAOCULAR LENS PLACEMENT ([facility]);  Surgeon: Elta Guadeloupe T. Gershon Crane, MD;  Location: [facility];  Service: Ophthalmology;  Laterality: Right;  CDE 4.41   CHEST TUBE INSERTION  09/11/2011   Procedure: INSERTION PLEURAL DRAINAGE CATHETER;  Surgeon: Gaye Pollack, MD;  Location: [facility];  Service: Thoracic;  Laterality: Right;   MASTECTOMY Right    PERICARDIAL WINDOW  09/11/2011   Procedure: PERICARDIAL WINDOW;  Surgeon: Gaye Pollack, MD;  Location: [facility];  Service: Thoracic;  Laterality: N/A;   REMOVAL OF PLEURAL DRAINAGE CATHETER  12/19/2011   Procedure: REMOVAL OF PLEURAL DRAINAGE CATHETER;  Surgeon: Gaye Pollack, MD;  Location: [facility];  Service: Thoracic;  Laterality: Right;  TO BE DONE IN MINOR ROOM, [place] Left 06/07/2015   Procedure: LEFT TOTAL HIP ARTHROPLASTY ANTERIOR APPROACH;  Surgeon: Gaynelle Arabian, MD;  Location: [facility];  Service: Orthopedics;  Laterality: Left;   VIDEO BRONCHOSCOPY  09/11/2011   Procedure: VIDEO BRONCHOSCOPY;  Surgeon: Gaye Pollack, MD;  Location: [facility];  Service: Thoracic;  Laterality: N/A;    FAMILY HISTORY Family History  Problem Relation Age of  Onset   Cancer Mother        breast   Heart disease Father    Diabetes Other    Anesthesia problems Neg Hx    The patient's mother was diagnosed with breast cancer at age 75, she died age 83 from congestive heart failure.The patient's father died from heart disease at age 36.  She has one sister alive & well.  Two brothers alive & well, one with diabetes. The patient's sister was also diagnosed with breast cancer, and was tested for the BRCA gene, and was negative. The patient herself has not been tested. There is no history of ovarian cancer in the family   GYNECOLOGIC HISTORY:  No LMP recorded. Patient is postmenopausal. Menarche age 24, the patient is GX P0. She stopped having periods with chemotherapy in 2005. 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 She was a Recruitment consultant  still Geophysicist/field seismologist. She is single, lives alone with her Shitzu-poodle Sammie.. Family is all in the [place] area.     ADVANCED DIRECTIVES: Not in place. At the 02/23/2014 visit the patient was given the appropriate documents to complete and notarize at her discretion. She tells me she is planning to name her sister, Barbara Parks, as healthcare power of attorney. [name] can be reached at [phone]   HEALTH MAINTENANCE: Social History   Tobacco Use   Smoking status: Former Smoker    Packs/day: 1.50    Years: 30.00    Pack years: 45.00    Types: Cigarettes    Quit date: 09/10/2007    Years since quitting: 11.5   Smokeless tobacco: Never Used  Substance Use Topics   Alcohol use: No   Drug use: No    Colonoscopy:  PAP:  Bone density: 04/09/2018, T score -1.8  Lipid panel:  Allergies  Allergen Reactions   Aspirin     REACTION: upset stomach   Latex Other (See Comments)    Blistering and skin peels off   Nsaids Nausea And Vomiting    Extreme nausea and vomiting    Current Outpatient  Medications  Medication Sig Dispense Refill   cholecalciferol (VITAMIN D3) 25 MCG (1000 UT) tablet Take 2 tablets (2,000 Units total) by mouth daily. 180 tablet 4   cyclobenzaprine (FLEXERIL) 10 MG tablet Take 1 tablet (10 mg total) by mouth 2 (two) times daily as needed for muscle spasms. 20 tablet 0   everolimus (AFINITOR) 10 MG tablet Take 1 tablet (10 mg total) by mouth daily. 28 tablet 6   gemfibrozil (LOPID) 600 MG tablet TAKE 1 TABLET (600 MG TOTAL) BY MOUTH 2 (TWO) TIMES DAILY BEFORE A MEAL. 180 tablet 1   letrozole (FEMARA) 2.5 MG tablet Take 1 tablet (2.5 mg total) by mouth daily. 90 tablet 3   loperamide (IMODIUM) 2 MG capsule Take 2 mg by mouth 4 (four) times daily as needed for diarrhea or loose stools. Reported on 11/30/2015     metFORMIN (GLUCOPHAGE-XR) 500 MG 24 hr tablet Take 1 tablet (500 mg total) by mouth 3 (three) times daily before meals.  3   traMADol (ULTRAM) 50 MG tablet Take 0.5-1 tablets (25-50 mg total) by mouth every 6 (six) hours as needed. (Patient not taking: Reported on 03/26/2019) 30 tablet 0   No current facility-administered medications for this visit.     OBJECTIVE:  Vitals:   03/26/19 1423  BP: 114/64  Pulse: (!) 103  Resp: 17  Temp: 98.3 F (36.8 C)  SpO2: 99%     Body mass index is 21.43 kg/m.   Filed Weights   03/26/19 1423  Weight: 126 lb 12.8 oz (57.5 kg)  Weight was 153 pounds June 2019 (down 30 pounds) ECOG FS:1 - Symptomatic but completely ambulatory GENERAL: Patient is a well appearing female in no acute distress HEENT:  Sclerae anicteric.  Oropharynx clear and moist. No ulcerations or evidence of oropharyngeal candidiasis. Neck is supple.  NODES:  No cervical, supraclavicular, or axillary lymphadenopathy palpated.  BREAST EXAM:  deferred LUNGS:  Clear to auscultation bilaterally.  No wheezes or rhonchi. HEART:  Regular rate and rhythm. No murmur appreciated. ABDOMEN:  Soft, nontender.  Positive, normoactive bowel sounds. No  organomegaly palpated. MSK:  No focal spinal tenderness to palpation. Full range of motion bilaterally in the upper extremities. EXTREMITIES:  No peripheral edema.   SKIN:  Clear with no obvious rashes or skin changes. No nail dyscrasia.  NEURO:  Nonfocal. Well oriented.  Appropriate affect.     LAB  RESULTS:  CMP     Component Value Date/Time   NA 138 03/26/2019 1413   NA 139 07/08/2017 0942   K 4.8 03/26/2019 1413   K 3.8 07/08/2017 0942   CL 107 03/26/2019 1413   CL 109 (H) 12/31/2012 0940   CO2 21 (L) 03/26/2019 1413   CO2 18 (L) 07/08/2017 0942   GLUCOSE 85 03/26/2019 1413   GLUCOSE 156 (H) 07/08/2017 0942   GLUCOSE 127 (H) 12/31/2012 0940   BUN 24 (H) 03/26/2019 1413   BUN 17.7 07/08/2017 0942   CREATININE 1.00 03/26/2019 1413   CREATININE 1.18 (H) 12/25/2017 0959   CREATININE 1.0 07/08/2017 0942   CALCIUM 9.5 03/26/2019 1413   CALCIUM 10.4 07/08/2017 0942   PROT 7.8 03/26/2019 1413   PROT 7.7 07/08/2017 0942   ALBUMIN 3.7 03/26/2019 1413   ALBUMIN 3.4 (L) 07/08/2017 0942   AST 22 03/26/2019 1413   AST 50 (H) 12/25/2017 0959   AST 54 (H) 07/08/2017 0942   ALT 11 03/26/2019 1413   ALT 53 12/25/2017 0959   ALT 39 07/08/2017 0942   ALKPHOS 179 (H) 03/26/2019 1413   ALKPHOS 232 (H) 07/08/2017 0942   BILITOT 0.3 03/26/2019 1413   BILITOT 0.3 12/25/2017 0959   BILITOT 0.42 07/08/2017 0942   GFRNONAA 58 (L) 03/26/2019 1413   GFRNONAA 47 (L) 12/25/2017 0959   GFRAA >60 03/26/2019 1413   GFRAA 54 (L) 12/25/2017 0959    I No results found for: SPEP  Lab Results  Component Value Date   WBC 8.7 03/26/2019   NEUTROABS 6.0 03/26/2019   HGB 14.3 03/26/2019   HCT 44.2 03/26/2019   MCV 81.7 03/26/2019   PLT 299 03/26/2019      Chemistry      Component Value Date/Time   NA 138 03/26/2019 1413   NA 139 07/08/2017 0942   K 4.8 03/26/2019 1413   K 3.8 07/08/2017 0942   CL 107 03/26/2019 1413   CL 109 (H) 12/31/2012 0940   CO2 21 (L) 03/26/2019 1413   CO2 18  (L) 07/08/2017 0942   BUN 24 (H) 03/26/2019 1413   BUN 17.7 07/08/2017 0942   CREATININE 1.00 03/26/2019 1413   CREATININE 1.18 (H) 12/25/2017 0959   CREATININE 1.0 07/08/2017 0942      Component Value Date/Time   CALCIUM 9.5 03/26/2019 1413   CALCIUM 10.4 07/08/2017 0942   ALKPHOS 179 (H) 03/26/2019 1413   ALKPHOS 232 (H) 07/08/2017 0942   AST 22 03/26/2019 1413   AST 50 (H) 12/25/2017 0959   AST 54 (H) 07/08/2017 0942   ALT 11 03/26/2019 1413   ALT 53 12/25/2017 0959   ALT 39 07/08/2017 0942   BILITOT 0.3 03/26/2019 1413   BILITOT 0.3 12/25/2017 0959   BILITOT 0.42 07/08/2017 0942       Lab Results  Component Value Date   LABCA2 58 (H) 09/14/2012    No components found for: [id number]  No results for input(s): INR in the last 168 hours.  Urinalysis    Component Value Date/Time   COLORURINE YELLOW 11/25/2017 0901   APPEARANCEUR HAZY (A) 11/25/2017 0901   LABSPEC 1.017 11/25/2017 0901   LABSPEC 1.030 07/13/2015 0841   PHURINE 5.0 11/25/2017 0901   GLUCOSEU NEGATIVE 11/25/2017 0901   GLUCOSEU Negative 07/13/2015 0841   HGBUR MODERATE (A) 11/25/2017 0901   BILIRUBINUR NEGATIVE 11/25/2017 0901   BILIRUBINUR Negative  07/13/2015 0841   KETONESUR NEGATIVE 11/25/2017 0901   PROTEINUR 100 (A) 11/25/2017 0901   UROBILINOGEN 0.2 07/13/2015 0841   NITRITE NEGATIVE 11/25/2017 0901   LEUKOCYTESUR NEGATIVE 11/25/2017 0901   LEUKOCYTESUR Negative 07/13/2015 0841    STUDIES: No results found.  ASSESSMENT: 68 y.o. [place], [place] woman with stage IV breast cancer, on BOLERO-4 trial  (1) status post right mastectomy and sentinel lymph node sampling 03/22/2004 for a right upper-outer quadrant pT2 pN1, stage IIB invasive ductal carcinoma with lobular features, grade 2, estrogen receptor and progesterone receptor positive, HER-2 negative, with an MIB-1 of 9% ([id number] and [id number])  (2) addition all right axillary lymph node sampling 05/07/2004 showed 2 benign lymph nodes (4 lymph  nodes previously removed, so total was one positive lymph node out of 6; [id number])  (3) the patient was evaluated by radiation oncology; no postmastectomy radiation recommended  (4) adjuvant chemotherapy with dose dense doxorubicin and cyclophosphamide x4 cycles (first cycle delayed one week) followed by dose dense paclitaxel x4 was completed 09/18/2004  (5) tamoxifen started March 2006, discontinued 2009  METASTATIC DISEASE: February 2013 (6) presenting with a large pericardial effusion, large right pleural effusion and possible right middle lobe bronchial obstruction February 2013, status post pericardial window placement, fiberoptic bronchoscopy and right Pleurx placement 09/11/2011, with biopsy of the bronchus intermedius and pericardium positive for metastatic breast cancer, estrogen receptor 91% positive with moderate staining intensity, progesterone receptor 100% positive with strong staining intensity, with an MIB-1 of 35%, and no HER-2 amplification, the signals ratio being 1.37 ([id number])  (7) enrolled in BOLERO-4 trial 10/10/2011, receiving letrozole and everolimus  (a) two small areas of enhancement in the cerebellum noted by brain MRI 10/03/2011 were no longer apparent on repeat MRI 08/21/2012-- most recent brain MRI 04/01/2014 showed no evidence of intracranial metastatic disease  (b) sclerotic lesions in left iliac bone and sacrum have not been biopsied; stable; plan is to start zolendronate after patient updates her dental care (extraction planned)  (c) RLL lung nodule, stable (rescanned 07/12/2014 and 08/11/2014) R hilar and subcarinal lymph nodes: stable  (d) most recent CT of the chest:03/25/2018, stable  (e) CT [place] 12/12/2018 stable  (8) additional problems:  (a) hepatic steatosis  (b) COPD/ emphysema/ asthma  (c) advanced L hip osteoarthritis, status post left total hip replacement 06/07/2015  (d) aortoiliac atherosclerosis  (e) dental evaluation pending w possible  dental extractions  (f) likely thalassemia trait  (g) hyperlipidemia  (9) Bone density concerns: DEXA scan 08/11/2013 was normal  (a) repeat DEXA scan 04/09/2018 shows a T score of -1.8, osteopenia  (10) progression of bony disease:  (a) PET scan 12/22/2018 shows no hypermetabolic soft tissue metastases but multifocal bony metastasis  (b) denosumab/Xgeva to start on 02/25/2019, repeated every 28 days (delayed due to dental extraction healing issues)   PLAN: Graycee is doing well today.  She has no clinical signs of cancer progression.  Her tumor markers are slowly elevating, and we are monitoring these.  She is due for restaging in 04/2019.  She is tolerating the Letrozole and Everolimus well and will continue this.  Latiana did have some pain after the xgeva.  This resolved with one tylenol and one aleve.  She is going to continue that as needed.  Hopefully it will be better today.  She was recommended to continue with her calcium supplementation.  Crickett and I reviewed healthy diet and exercise today.  She remains active, which is good.    We will see  her back in 4 weeks for labs, f/u, and her next injection.  She knows to call for any questions that may arise between now and her next appointment.  We are happy to see her sooner if needed. She was recommended to continue with the appropriate pandemic precautions.   A total of (20) minutes of face-to-face time was spent with this patient with greater than 50% of that time in counseling and care-coordination.    Wilber Bihari, NP Medical Oncology and Hematology [facility] [address] [place], [place] [address] Tel. [phone]    Fax. [phone]

## 2019-03-26 NOTE — Patient Instructions (Signed)
Denosumab injection What is this medicine? DENOSUMAB (den oh sue mab) slows bone breakdown. Prolia is used to treat osteoporosis in women after menopause and in men, and in people who are taking corticosteroids for 6 months or more. Xgeva is used to treat a high calcium level due to cancer and to prevent bone fractures and other bone problems caused by multiple myeloma or cancer bone metastases. Xgeva is also used to treat giant cell tumor of the bone. This medicine may be used for other purposes; ask your health care provider or pharmacist if you have questions. COMMON BRAND NAME(S): Prolia, XGEVA What should I tell my health care provider before I take this medicine? They need to know if you have any of these conditions:  dental disease  having surgery or tooth extraction  infection  kidney disease  low levels of calcium or Vitamin D in the blood  malnutrition  on hemodialysis  skin conditions or sensitivity  thyroid or parathyroid disease  an unusual reaction to denosumab, other medicines, foods, dyes, or preservatives  pregnant or trying to get pregnant  breast-feeding How should I use this medicine? This medicine is for injection under the skin. It is given by a health care professional in a hospital or clinic setting. A special MedGuide will be given to you before each treatment. Be sure to read this information carefully each time. For Prolia, talk to your pediatrician regarding the use of this medicine in children. Special care may be needed. For Xgeva, talk to your pediatrician regarding the use of this medicine in children. While this drug may be prescribed for children as young as 13 years for selected conditions, precautions do apply. Overdosage: If you think you have taken too much of this medicine contact a poison control center or emergency room at once. NOTE: This medicine is only for you. Do not share this medicine with others. What if I miss a dose? It is  important not to miss your dose. Call your doctor or health care professional if you are unable to keep an appointment. What may interact with this medicine? Do not take this medicine with any of the following medications:  other medicines containing denosumab This medicine may also interact with the following medications:  medicines that lower your chance of fighting infection  steroid medicines like prednisone or cortisone This list may not describe all possible interactions. Give your health care provider a list of all the medicines, herbs, non-prescription drugs, or dietary supplements you use. Also tell them if you smoke, drink alcohol, or use illegal drugs. Some items may interact with your medicine. What should I watch for while using this medicine? Visit your doctor or health care professional for regular checks on your progress. Your doctor or health care professional may order blood tests and other tests to see how you are doing. Call your doctor or health care professional for advice if you get a fever, chills or sore throat, or other symptoms of a cold or flu. Do not treat yourself. This drug may decrease your body's ability to fight infection. Try to avoid being around people who are sick. You should make sure you get enough calcium and vitamin D while you are taking this medicine, unless your doctor tells you not to. Discuss the foods you eat and the vitamins you take with your health care professional. See your dentist regularly. Brush and floss your teeth as directed. Before you have any dental work done, tell your dentist you are   receiving this medicine. Do not become pregnant while taking this medicine or for 5 months after stopping it. Talk with your doctor or health care professional about your birth control options while taking this medicine. Women should inform their doctor if they wish to become pregnant or think they might be pregnant. There is a potential for serious side  effects to an unborn child. Talk to your health care professional or pharmacist for more information. What side effects may I notice from receiving this medicine? Side effects that you should report to your doctor or health care professional as soon as possible:  allergic reactions like skin rash, itching or hives, swelling of the face, lips, or tongue  bone pain  breathing problems  dizziness  jaw pain, especially after dental work  redness, blistering, peeling of the skin  signs and symptoms of infection like fever or chills; cough; sore throat; pain or trouble passing urine  signs of low calcium like fast heartbeat, muscle cramps or muscle pain; pain, tingling, numbness in the hands or feet; seizures  unusual bleeding or bruising  unusually weak or tired Side effects that usually do not require medical attention (report to your doctor or health care professional if they continue or are bothersome):  constipation  diarrhea  headache  joint pain  loss of appetite  muscle pain  runny nose  tiredness  upset stomach This list may not describe all possible side effects. Call your doctor for medical advice about side effects. You may report side effects to FDA at 1-800-FDA-1088. Where should I keep my medicine? This medicine is only given in a clinic, doctor's office, or other health care setting and will not be stored at home. NOTE: This sheet is a summary. It may not cover all possible information. If you have questions about this medicine, talk to your doctor, pharmacist, or health care provider.  2020 Elsevier/Gold Standard (2017-11-21 16:10:44)

## 2019-04-13 ENCOUNTER — Other Ambulatory Visit: Payer: Self-pay

## 2019-04-13 ENCOUNTER — Ambulatory Visit
Admission: RE | Admit: 2019-04-13 | Discharge: 2019-04-13 | Disposition: A | Discharge status: Home / Self Care | Payer: Medicare Other | Source: Ambulatory Visit | Attending: Adult Health | Admitting: Adult Health

## 2019-04-13 DIAGNOSIS — Z1231 Encounter for screening mammogram for malignant neoplasm of breast: Secondary | ICD-10-CM | POA: Diagnosis not present

## 2019-04-13 DIAGNOSIS — C7800 Secondary malignant neoplasm of unspecified lung: Secondary | ICD-10-CM

## 2019-04-13 DIAGNOSIS — C50911 Malignant neoplasm of unspecified site of right female breast: Secondary | ICD-10-CM

## 2019-04-14 IMAGING — CT CT CHEST W/ CM
2 of 4 series · 15 of 36 positions shown, 18 images · IV contrast (iopamidol)
Comparison: 07/08/2017

CLINICAL DATA: Right breast carcinoma metastatic to lung.
Undergoing oral chemotherapy. Restaging.

EXAM:
CT CHEST WITH CONTRAST
TECHNIQUE: Multidetector CT imaging of the chest was performed during
intravenous contrast administration.
CONTRAST:  75mL [id number] IOPAMIDOL ([id number]) INJECTION 61%

[Series 2: axial st · axial · 0.77mm/px · z∈[-589,-277]mm · 12 of 183 slices shown, 15 images]
[im 14/183  mediastinal]
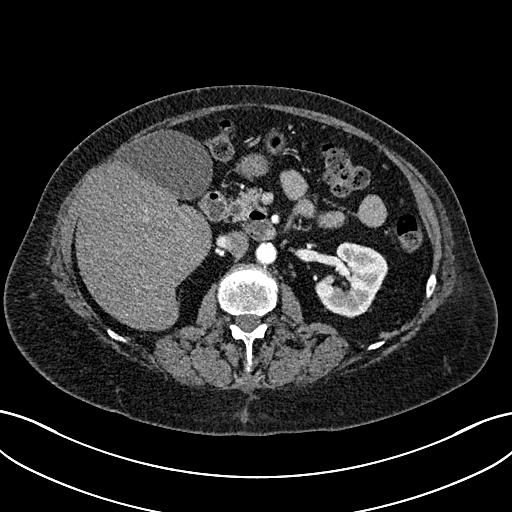
[im 14/183  lung]
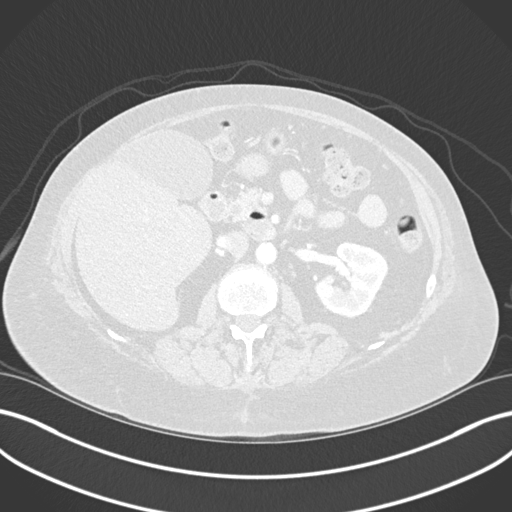
[im 27/183  lung]
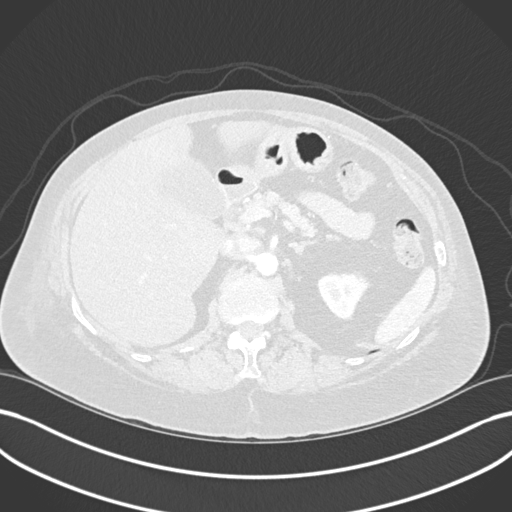
[im 40/183  lung]
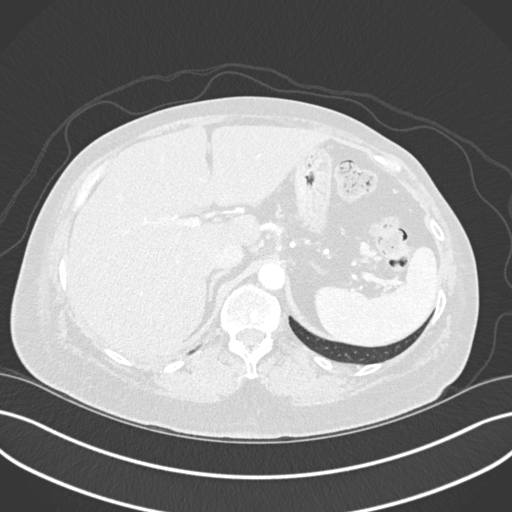
[im 53/183  lung]
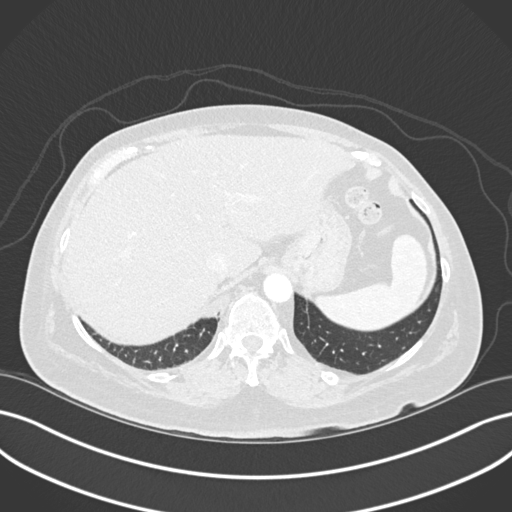
[im 66/183  mediastinal]
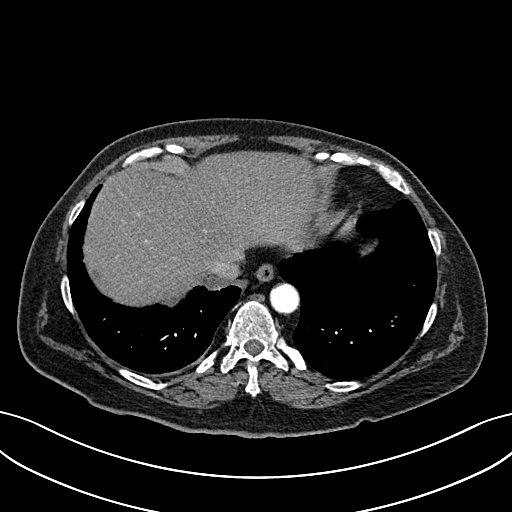
[im 66/183  lung]
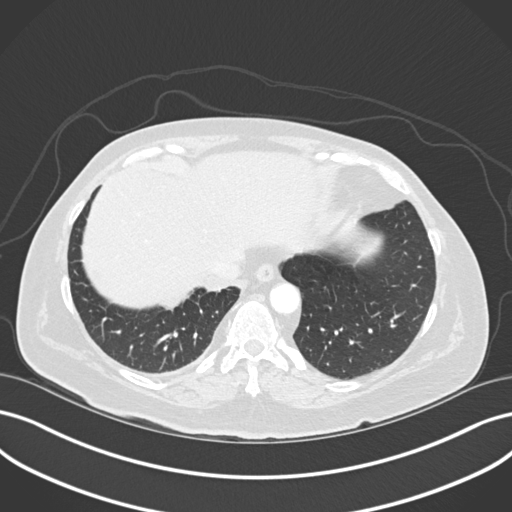
[im 79/183  lung]
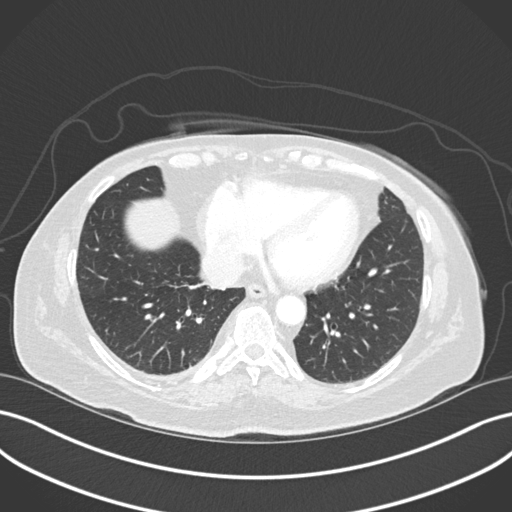
[im 105/183  lung]
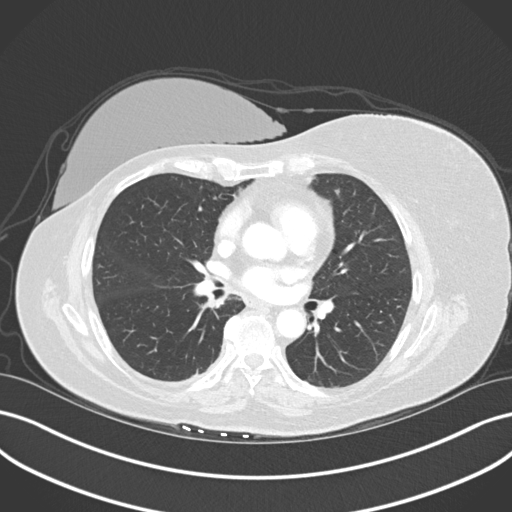
[im 118/183  lung]
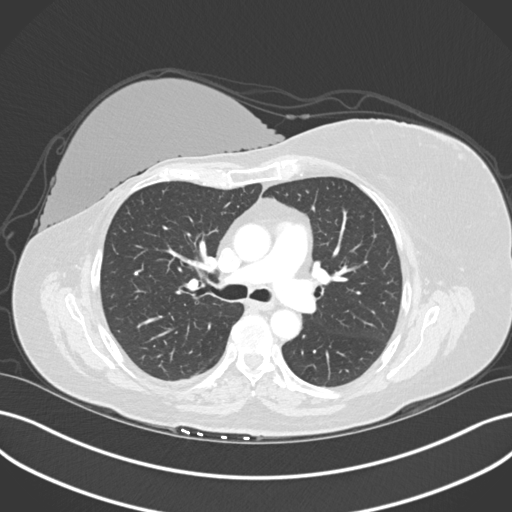
[im 131/183  mediastinal]
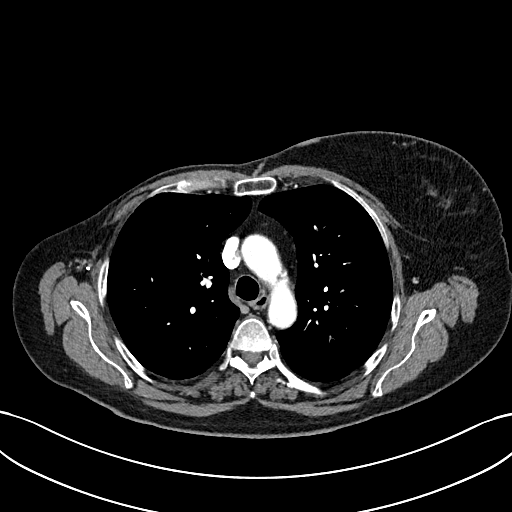
[im 131/183  lung]
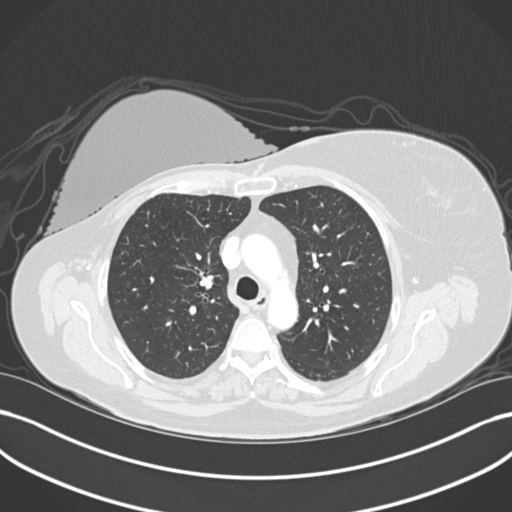
[im 144/183  lung]
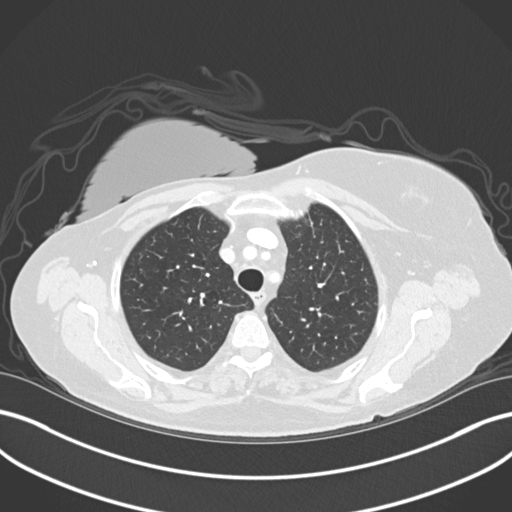
[im 157/183  lung]
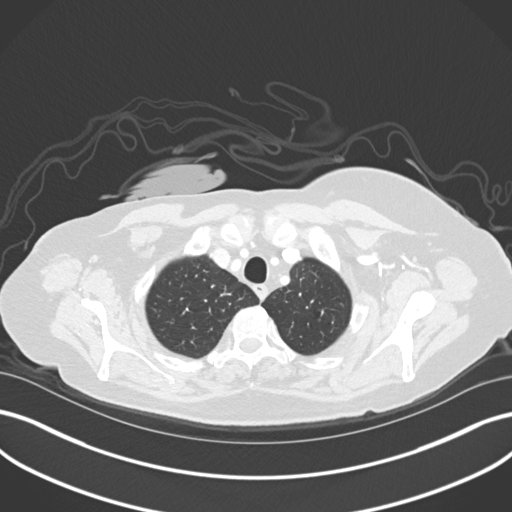
[im 170/183  lung]
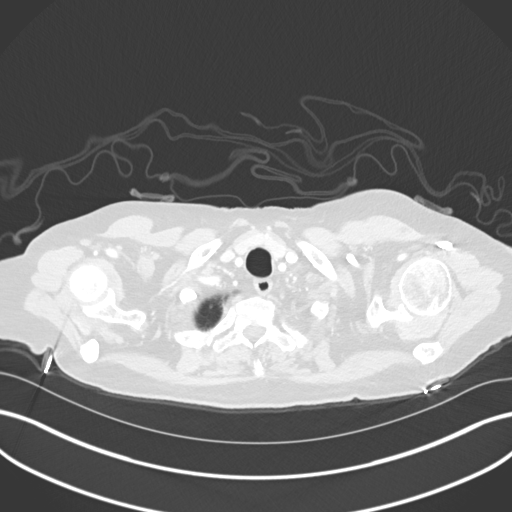

[Series 6: coronal · coronal · 0.73mm/px · 3 of 137 slices shown]
[im 28/137  lung]
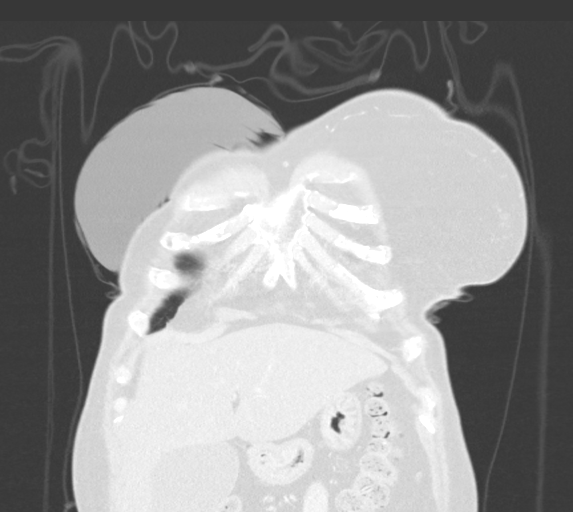
[im 55/137  lung]
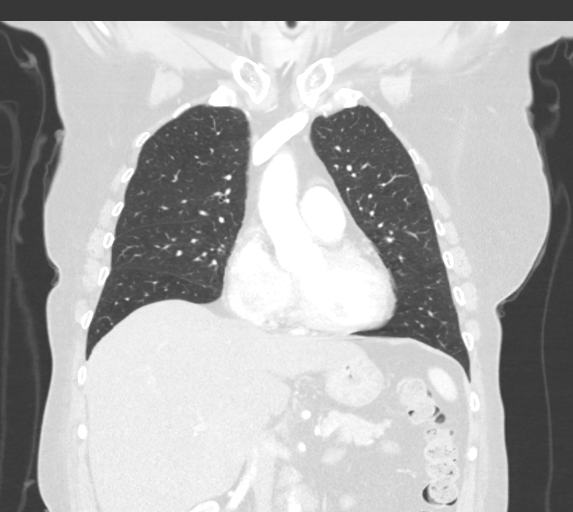
[im 82/137  lung]
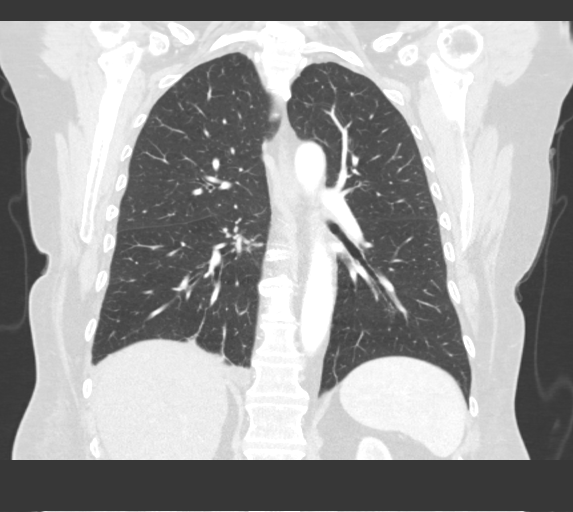

[15 of 36 positions shown; findings below may reference images not displayed]

FINDINGS: RECIST

Target Lesions:

1. 5 mm right lower lobe pulmonary nodule, image 100 of series 5,
unchanged
2. 6 mm subcarinal lymph node, image 71 of series 2, unchanged

Non-target Lesions:

1. Mild right posterior pleural thickening, unchanged

Cardiovascular:  No acute findings.

Mediastinum/Nodes: No masses or pathologically enlarged lymph nodes
identified. 6 mm partially calcified subcarinal mediastinal lymph
node on image 71/2 is stable. Previous right mastectomy and axillary
lymph node dissection, without axillary lymphadenopathy.

Lungs/Pleura: 7 x 13 pulmonary nodule in the posterior right lower
lobe is stable since previous study. A 5 mm peripheral pulmonary
nodule also in the posterior right lower lobe on image 100/5 is also
stable. No new or enlarging pulmonary nodules or masses identified.
No evidence of pulmonary infiltrate or pleural effusion. Mild
posterior pleural thickening in the inferior right hemithorax is
unchanged.

Upper Abdomen: Stable diffuse hepatic steatosis. 1.4 cm left adrenal
mass is also unchanged.

Musculoskeletal: Stable small sclerotic bone lesions involving the
right lateral 2nd rib and left posterior 10th rib are unchanged
compared to multiple prior exams. No other suspicious bone lesions
identified.
IMPRESSION: Stable 7 x 13 mm pulmonary nodule in right lower lobe posterior
costophrenic sulcus. Recommend continued followup by CT in 6 months.

No new or progressive disease within the thorax.

Stable mild right posterior pleural thickening.

Stable hepatic steatosis and small benign left adrenal adenoma.

## 2019-04-22 ENCOUNTER — Inpatient Hospital Stay (HOSPITAL_BASED_OUTPATIENT_CLINIC_OR_DEPARTMENT_OTHER): Payer: Medicare Other | Admitting: Adult Health

## 2019-04-22 ENCOUNTER — Other Ambulatory Visit: Payer: Self-pay

## 2019-04-22 ENCOUNTER — Encounter: Payer: Self-pay | Admitting: Adult Health

## 2019-04-22 ENCOUNTER — Inpatient Hospital Stay: Payer: Medicare Other

## 2019-04-22 ENCOUNTER — Inpatient Hospital Stay: Payer: Medicare Other | Attending: Oncology

## 2019-04-22 VITALS — BP 117/73 | HR 101 | Temp 98.3°F | Resp 18 | Ht 64.5 in | Wt 126.7 lb

## 2019-04-22 DIAGNOSIS — Z170 Estrogen receptor positive status [ER+]: Secondary | ICD-10-CM

## 2019-04-22 DIAGNOSIS — C7800 Secondary malignant neoplasm of unspecified lung: Secondary | ICD-10-CM

## 2019-04-22 DIAGNOSIS — I3131 Malignant pericardial effusion in diseases classified elsewhere: Secondary | ICD-10-CM

## 2019-04-22 DIAGNOSIS — C50411 Malignant neoplasm of upper-outer quadrant of right female breast: Secondary | ICD-10-CM

## 2019-04-22 DIAGNOSIS — C7951 Secondary malignant neoplasm of bone: Secondary | ICD-10-CM

## 2019-04-22 DIAGNOSIS — C50911 Malignant neoplasm of unspecified site of right female breast: Secondary | ICD-10-CM

## 2019-04-22 DIAGNOSIS — C801 Malignant (primary) neoplasm, unspecified: Secondary | ICD-10-CM

## 2019-04-22 LAB — COMPREHENSIVE METABOLIC PANEL
ALT: 11 U/L (ref 0–44)
AST: 23 U/L (ref 15–41)
Albumin: 4.1 g/dL (ref 3.5–5.0)
Alkaline Phosphatase: 169 U/L — ABNORMAL HIGH (ref 38–126)
Anion gap: 12 (ref 5–15)
BUN: 26 mg/dL — ABNORMAL HIGH (ref 8–23)
CO2: 19 mmol/L — ABNORMAL LOW (ref 22–32)
Calcium: 10.4 mg/dL — ABNORMAL HIGH (ref 8.9–10.3)
Chloride: 108 mmol/L (ref 98–111)
Creatinine, Ser: 1.05 mg/dL — ABNORMAL HIGH (ref 0.44–1.00)
GFR calc Af Amer: >60 mL/min (ref >60)
GFR calc non Af Amer: 55 mL/min — ABNORMAL LOW (ref >60)
Glucose, Bld: 112 mg/dL — ABNORMAL HIGH (ref 70–99)
Potassium: 4.2 mmol/L (ref 3.5–5.1)
Sodium: 139 mmol/L (ref 135–145)
Total Bilirubin: 0.3 mg/dL (ref 0.3–1.2)
Total Protein: 8.3 g/dL — ABNORMAL HIGH (ref 6.5–8.1)

## 2019-04-22 LAB — CBC WITH DIFFERENTIAL/PLATELET
Abs Immature Granulocytes: 0.02 10*3/uL (ref 0.00–0.07)
Basophils Absolute: 0.1 10*3/uL (ref 0.0–0.1)
Basophils Relative: 2 %
Eosinophils Absolute: 0.1 10*3/uL (ref 0.0–0.5)
Eosinophils Relative: 2 %
HCT: 47 % — ABNORMAL HIGH (ref 36.0–46.0)
Hemoglobin: 15.2 g/dL — ABNORMAL HIGH (ref 12.0–15.0)
Immature Granulocytes: 0 %
Lymphocytes Relative: 19 %
Lymphs Abs: 1.2 10*3/uL (ref 0.7–4.0)
MCH: 27 pg (ref 26.0–34.0)
MCHC: 32.3 g/dL (ref 30.0–36.0)
MCV: 83.3 fL (ref 80.0–100.0)
Monocytes Absolute: 0.7 10*3/uL (ref 0.1–1.0)
Monocytes Relative: 12 %
Neutro Abs: 4 10*3/uL (ref 1.7–7.7)
Neutrophils Relative %: 65 %
Platelets: 309 10*3/uL (ref 150–400)
RBC: 5.64 MIL/uL — ABNORMAL HIGH (ref 3.87–5.11)
RDW: 14.8 % (ref 11.5–15.5)
WBC: 6.1 10*3/uL (ref 4.0–10.5)
nRBC: 0 % (ref 0.0–0.2)

## 2019-04-22 MED ORDER — DENOSUMAB 120 MG/1.7ML ~~LOC~~ SOLN
SUBCUTANEOUS | Status: AC
  Filled 2019-04-22: qty 1.7

## 2019-04-22 MED ORDER — DENOSUMAB 120 MG/1.7ML ~~LOC~~ SOLN
120.0000 mg | Freq: Once | SUBCUTANEOUS | Status: AC
  Administered 2019-04-22: 120 mg via SUBCUTANEOUS

## 2019-04-22 NOTE — Patient Instructions (Signed)
Denosumab injection What is this medicine? DENOSUMAB (den oh sue mab) slows bone breakdown. Prolia is used to treat osteoporosis in women after menopause and in men, and in people who are taking corticosteroids for 6 months or more. Xgeva is used to treat a high calcium level due to cancer and to prevent bone fractures and other bone problems caused by multiple myeloma or cancer bone metastases. Xgeva is also used to treat giant cell tumor of the bone. This medicine may be used for other purposes; ask your health care provider or pharmacist if you have questions. COMMON BRAND NAME(S): Prolia, XGEVA What should I tell my health care provider before I take this medicine? They need to know if you have any of these conditions:  dental disease  having surgery or tooth extraction  infection  kidney disease  low levels of calcium or Vitamin D in the blood  malnutrition  on hemodialysis  skin conditions or sensitivity  thyroid or parathyroid disease  an unusual reaction to denosumab, other medicines, foods, dyes, or preservatives  pregnant or trying to get pregnant  breast-feeding How should I use this medicine? This medicine is for injection under the skin. It is given by a health care professional in a hospital or clinic setting. A special MedGuide will be given to you before each treatment. Be sure to read this information carefully each time. For Prolia, talk to your pediatrician regarding the use of this medicine in children. Special care may be needed. For Xgeva, talk to your pediatrician regarding the use of this medicine in children. While this drug may be prescribed for children as young as 13 years for selected conditions, precautions do apply. Overdosage: If you think you have taken too much of this medicine contact a poison control center or emergency room at once. NOTE: This medicine is only for you. Do not share this medicine with others. What if I miss a dose? It is  important not to miss your dose. Call your doctor or health care professional if you are unable to keep an appointment. What may interact with this medicine? Do not take this medicine with any of the following medications:  other medicines containing denosumab This medicine may also interact with the following medications:  medicines that lower your chance of fighting infection  steroid medicines like prednisone or cortisone This list may not describe all possible interactions. Give your health care provider a list of all the medicines, herbs, non-prescription drugs, or dietary supplements you use. Also tell them if you smoke, drink alcohol, or use illegal drugs. Some items may interact with your medicine. What should I watch for while using this medicine? Visit your doctor or health care professional for regular checks on your progress. Your doctor or health care professional may order blood tests and other tests to see how you are doing. Call your doctor or health care professional for advice if you get a fever, chills or sore throat, or other symptoms of a cold or flu. Do not treat yourself. This drug may decrease your body's ability to fight infection. Try to avoid being around people who are sick. You should make sure you get enough calcium and vitamin D while you are taking this medicine, unless your doctor tells you not to. Discuss the foods you eat and the vitamins you take with your health care professional. See your dentist regularly. Brush and floss your teeth as directed. Before you have any dental work done, tell your dentist you are   receiving this medicine. Do not become pregnant while taking this medicine or for 5 months after stopping it. Talk with your doctor or health care professional about your birth control options while taking this medicine. Women should inform their doctor if they wish to become pregnant or think they might be pregnant. There is a potential for serious side  effects to an unborn child. Talk to your health care professional or pharmacist for more information. What side effects may I notice from receiving this medicine? Side effects that you should report to your doctor or health care professional as soon as possible:  allergic reactions like skin rash, itching or hives, swelling of the face, lips, or tongue  bone pain  breathing problems  dizziness  jaw pain, especially after dental work  redness, blistering, peeling of the skin  signs and symptoms of infection like fever or chills; cough; sore throat; pain or trouble passing urine  signs of low calcium like fast heartbeat, muscle cramps or muscle pain; pain, tingling, numbness in the hands or feet; seizures  unusual bleeding or bruising  unusually weak or tired Side effects that usually do not require medical attention (report to your doctor or health care professional if they continue or are bothersome):  constipation  diarrhea  headache  joint pain  loss of appetite  muscle pain  runny nose  tiredness  upset stomach This list may not describe all possible side effects. Call your doctor for medical advice about side effects. You may report side effects to FDA at 1-800-FDA-1088. Where should I keep my medicine? This medicine is only given in a clinic, doctor's office, or other health care setting and will not be stored at home. NOTE: This sheet is a summary. It may not cover all possible information. If you have questions about this medicine, talk to your doctor, pharmacist, or health care provider.  2020 Elsevier/Gold Standard (2017-11-21 16:10:44)

## 2019-04-22 NOTE — Progress Notes (Signed)
[facility]  Telephone:([phone] Fax:([phone]) [phone]    ID: Barbara Parks DOB: Dec 07, 1950  MR#: [id number]  GBT#:[id number]  Patient Care Team: [name], MD as PCP - General (Family Medicine) Jerline Pain, MD (Cardiology) Gaye Pollack, MD (Cardiothoracic Surgery) Magrinat, Virgie Dad, MD as Consulting Physician (Oncology) OTHER MD: Dr Erroll Luna- Merlene Laughter, DDS; Gaynelle Arabian MD   CHIEF COMPLAINT: metastatic breast cancer, on BOLERO study  CURRENT TREATMENT: letrozole + everolimus; Delton See   INTERVAL HISTORY: Barbara Parks returns today for follow-up and treatment of her metastatic estrogen receptor positive breast cancer.  She continues on letrozole. She tolerates this well.    She also continues on everolimus.  Her last dose of study drug was on 01/26/2019.  She has continued on this daily off study with good tolerance.    Barbara Parks receives [place] every 4 weeks.  She is due today for her third dose.  She had aching x 4 days with her first dose, however this was not present after her second dose.  She only experienced one day of mild fatigue, that is now resolved.  REVIEW OF SYSTEMS: Barbara Parks has been doing well.  She has continued to play golf.  She had some right hip aching while playing, but that resolved the following day.  [place]  She went with her cousin from [place], and her dog, and had a great time.    Barbara Parks denies any fever, chills, chest pain, palpitations, cough, shortness of breath.  She is without nausea, vomiting,bowel/bladder changes, headaches, or vision changes.  A detailed ROS was otherwise non contributory.    BREAST CANCER HISTORY: From Dr. Collier [name]'s original intake note 04/13/2004:  "This woman has been in good health all of her life. She recently moved from [place] to work here.  She palpated a mass at about the 12 oclock position in mid-July. She has not noticed any nipple retraction or skin  changes.  She was seen by her primary care doctor who subsequently referred her for a mammogram.  Mammogram was performed on 03/01/04. This demonstrated a spiculated 2 cm mass at the 12 oclock position in the right breast.  Upper outer quadrant of the left breast shows some distortion.   Physical exam at that time showed a firm, nontender nodule at the 12 oclock position in the right breast, 5 cm from the nipple.  Ultrasound of this area showed a hypoechoic ill-defined mass, measuring 2.2 x 1.3 x 1.4 cm.  Physical exam of the left breast showed general vague thickening, upper outer quadrant of the left breast with a discrete palpable mass. The ultrasound performed showed a single hypoechoic ill-defined nodule at the 1 oclock position, measuring 7 x 5 x 6 mm.    She had biopsies of both lesions on 03/02/03.  Needle core biopsy of the lesion on the right breast revealed invasive mammary carcinoma.  Needle core biopsy of the left breast showed a complex fibroadenoma.  Prognostic panel of the lesion on the right breast showed it to be ER positive at 73%, PR positive at 90% and proliferative index 9%, HER-2 was 1+.  Patient was referred to Dr. Annamaria Boots, who performed a simple mastectomy with sentinel lymph node evaluation on 03/22/04.  Final pathology showed this to be an invasive ductal carcinoma  with lobular features, measuring 2.2 cm, grade 2 of 3.  Margins negative for carcinoma.  Invasive ductal carcinoma was extended to involve deep dermis of  the nipple.  Lymphovascular invasion was identified.  Total of 4 sentinel lymph nodes were evaluated.  Touch imprints at the time of the OR was felt to be negative.  Subsequent evaluation showed a 5 mm focus of metastatic carcinoma in one of the four lymph nodes on microscopic after sectioning.  There was extracapsular extension  of one lymph node as well. The remaining three lymph nodes were all negative."  The patient's subsequent history is detailed above.   PAST MEDICAL  HISTORY: Past Medical History:  Diagnosis Date   Arthritis    left hip   Asthma    breast ca 2005   breast/chemo R mastectomy   Dermatitis    Diabetes mellitus without complication ([place])    GERD (gastroesophageal reflux disease)    Hypercholesteremia    Metastasis to lung ([place]) dx'd 08/2011   Osteopenia due to cancer therapy 09/09/2013   Palpitations    Personal history of chemotherapy    Shortness of breath     PAST SURGICAL HISTORY: Past Surgical History:  Procedure Laterality Date   BREAST SURGERY  2005   right   CATARACT EXTRACTION W/PHACO Left 01/18/2014   Procedure: CATARACT EXTRACTION PHACO AND INTRAOCULAR LENS PLACEMENT ([place]);  Surgeon: Elta Guadeloupe T. Gershon Crane, MD;  Location: [facility];  Service: Ophthalmology;  Laterality: Left;  CDE 15.79   CATARACT EXTRACTION W/PHACO Right 02/08/2014   Procedure: CATARACT EXTRACTION PHACO AND INTRAOCULAR LENS PLACEMENT ([facility]);  Surgeon: Elta Guadeloupe T. Gershon Crane, MD;  Location: [facility];  Service: Ophthalmology;  Laterality: Right;  CDE 4.41   CHEST TUBE INSERTION  09/11/2011   Procedure: INSERTION PLEURAL DRAINAGE CATHETER;  Surgeon: Gaye Pollack, MD;  Location: [facility];  Service: Thoracic;  Laterality: Right;   MASTECTOMY Right    PERICARDIAL WINDOW  09/11/2011   Procedure: PERICARDIAL WINDOW;  Surgeon: Gaye Pollack, MD;  Location: [facility];  Service: Thoracic;  Laterality: N/A;   REMOVAL OF PLEURAL DRAINAGE CATHETER  12/19/2011   Procedure: REMOVAL OF PLEURAL DRAINAGE CATHETER;  Surgeon: Gaye Pollack, MD;  Location: [facility];  Service: Thoracic;  Laterality: Right;  TO BE DONE IN MINOR ROOM, [place] Left 06/07/2015   Procedure: LEFT TOTAL HIP ARTHROPLASTY ANTERIOR APPROACH;  Surgeon: Gaynelle Arabian, MD;  Location: [facility];  Service: Orthopedics;  Laterality: Left;   VIDEO BRONCHOSCOPY  09/11/2011   Procedure: VIDEO BRONCHOSCOPY;  Surgeon: Gaye Pollack, MD;  Location: [facility];  Service: Thoracic;   Laterality: N/A;    FAMILY HISTORY Family History  Problem Relation Age of Onset   Cancer Mother        breast   Heart disease Father    Diabetes Other    Anesthesia problems Neg Hx    The patient's mother was diagnosed with breast cancer at age 9, she died age 34 from congestive heart failure.The patient's father died from heart disease at age 58.  She has one sister alive & well.  Two brothers alive & well, one with diabetes. The patient's sister was also diagnosed with breast cancer, and was tested for the BRCA gene, and was negative. The patient herself has not been tested. There is no history of ovarian cancer in the family   GYNECOLOGIC HISTORY:  No LMP recorded. Patient is postmenopausal. Menarche age 72, the patient is GX P0. She stopped having periods with chemotherapy in 2005. 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 She was a Archivist. She is single, lives alone with her Shitzu-poodle Sammie.. Family is all in the [place] area.     ADVANCED DIRECTIVES: Not in place. At the 02/23/2014 visit the patient was given the appropriate documents to complete and notarize at her discretion. She tells me she is planning to name her sister, Billie Ruddy, as healthcare power of attorney. [name] can be reached at [phone]   HEALTH MAINTENANCE: Social History   Tobacco Use   Smoking status: Former Smoker    Packs/day: 1.50    Years: 30.00    Pack years: 45.00    Types: Cigarettes    Quit date: 09/10/2007    Years since quitting: 11.6   Smokeless tobacco: Never Used  Substance Use Topics   Alcohol use: No   Drug use: No    Colonoscopy:  PAP:  Bone density: 04/09/2018, T score -1.8  Lipid panel:  Allergies  Allergen Reactions   Aspirin     REACTION: upset stomach   Latex Other (See Comments)    Blistering and skin peels off   Nsaids  Nausea And Vomiting    Extreme nausea and vomiting    Current Outpatient Medications  Medication Sig Dispense Refill   cholecalciferol (VITAMIN D3) 25 MCG (1000 UT) tablet Take 2 tablets (2,000 Units total) by mouth daily. 180 tablet 4   cyclobenzaprine (FLEXERIL) 10 MG tablet Take 1 tablet (10 mg total) by mouth 2 (two) times daily as needed for muscle spasms. 20 tablet 0   everolimus (AFINITOR) 10 MG tablet Take 1 tablet (10 mg total) by mouth daily. 28 tablet 6   gemfibrozil (LOPID) 600 MG tablet TAKE 1 TABLET (600 MG TOTAL) BY MOUTH 2 (TWO) TIMES DAILY BEFORE A MEAL. 180 tablet 1   letrozole (FEMARA) 2.5 MG tablet Take 1 tablet (2.5 mg total) by mouth daily. 90 tablet 3   loperamide (IMODIUM) 2 MG capsule Take 2 mg by mouth 4 (four) times daily as needed for diarrhea or loose stools. Reported on 11/30/2015     metFORMIN (GLUCOPHAGE-XR) 500 MG 24 hr tablet Take 1 tablet (500 mg total) by mouth 3 (three) times daily before meals.  3   traMADol (ULTRAM) 50 MG tablet Take 0.5-1 tablets (25-50 mg total) by mouth every 6 (six) hours as needed. (Patient not taking: Reported on 03/26/2019) 30 tablet 0   No current facility-administered medications for this visit.     OBJECTIVE:  Vitals:   04/22/19 0820  BP: 117/73  Pulse: (!) 101  Resp: 18  Temp: 98.3 F (36.8 C)  SpO2: 100%     Body mass index is 21.41 kg/m.   Filed Weights   04/22/19 0820  Weight: 126 lb 11.2 oz (57.5 kg)   ECOG FS:1 - Symptomatic but completely ambulatory GENERAL: Patient is a well appearing female in no acute distress HEENT:  Sclerae anicteric.  Oropharynx clear and moist. No ulcerations or evidence of oropharyngeal candidiasis. Neck is supple.  NODES:  No cervical, supraclavicular, or axillary lymphadenopathy palpated.  BREAST EXAM:  deferred LUNGS:  Clear to auscultation bilaterally.  No wheezes or rhonchi. HEART:  Regular rate and rhythm. No murmur appreciated. ABDOMEN:  Soft, nontender.  Positive,  normoactive bowel sounds. No organomegaly palpated. MSK:  No focal spinal tenderness to palpation. Full range of motion bilaterally in the upper extremities. EXTREMITIES:  No peripheral edema.   SKIN:  Clear with no obvious rashes or skin changes. No nail dyscrasia. NEURO:  Nonfocal. Well oriented.  Appropriate affect.     LAB  RESULTS:  CMP     Component Value Date/Time   NA 138 03/26/2019 1413   NA 139 07/08/2017 0942   K 4.8 03/26/2019 1413   K 3.8 07/08/2017 0942   CL 107 03/26/2019 1413   CL 109 (H) 12/31/2012 0940   CO2 21 (L) 03/26/2019 1413   CO2 18 (L) 07/08/2017 0942   GLUCOSE 85 03/26/2019 1413   GLUCOSE 156 (H) 07/08/2017 0942   GLUCOSE 127 (H) 12/31/2012 0940   BUN 24 (H) 03/26/2019 1413   BUN 17.7 07/08/2017 0942   CREATININE 1.00 03/26/2019 1413   CREATININE 1.18 (H) 12/25/2017 0959   CREATININE 1.0 07/08/2017 0942   CALCIUM 9.5 03/26/2019 1413   CALCIUM 10.4 07/08/2017 0942   PROT 7.8 03/26/2019 1413   PROT 7.7 07/08/2017 0942   ALBUMIN 3.7 03/26/2019 1413   ALBUMIN 3.4 (L) 07/08/2017 0942   AST 22 03/26/2019 1413   AST 50 (H) 12/25/2017 0959   AST 54 (H) 07/08/2017 0942   ALT 11 03/26/2019 1413   ALT 53 12/25/2017 0959   ALT 39 07/08/2017 0942   ALKPHOS 179 (H) 03/26/2019 1413   ALKPHOS 232 (H) 07/08/2017 0942   BILITOT 0.3 03/26/2019 1413   BILITOT 0.3 12/25/2017 0959   BILITOT 0.42 07/08/2017 0942   GFRNONAA 58 (L) 03/26/2019 1413   GFRNONAA 47 (L) 12/25/2017 0959   GFRAA >60 03/26/2019 1413   GFRAA 54 (L) 12/25/2017 0959    I No results found for: SPEP  Lab Results  Component Value Date   WBC 6.1 04/22/2019   NEUTROABS 4.0 04/22/2019   HGB 15.2 (H) 04/22/2019   HCT 47.0 (H) 04/22/2019   MCV 83.3 04/22/2019   PLT 309 04/22/2019      Chemistry      Component Value Date/Time   NA 138 03/26/2019 1413   NA 139 07/08/2017 0942   K 4.8 03/26/2019 1413   K 3.8 07/08/2017 0942   CL 107 03/26/2019 1413   CL 109 (H) 12/31/2012 0940    CO2 21 (L) 03/26/2019 1413   CO2 18 (L) 07/08/2017 0942   BUN 24 (H) 03/26/2019 1413   BUN 17.7 07/08/2017 0942   CREATININE 1.00 03/26/2019 1413   CREATININE 1.18 (H) 12/25/2017 0959   CREATININE 1.0 07/08/2017 0942      Component Value Date/Time   CALCIUM 9.5 03/26/2019 1413   CALCIUM 10.4 07/08/2017 0942   ALKPHOS 179 (H) 03/26/2019 1413   ALKPHOS 232 (H) 07/08/2017 0942   AST 22 03/26/2019 1413   AST 50 (H) 12/25/2017 0959   AST 54 (H) 07/08/2017 0942   ALT 11 03/26/2019 1413   ALT 53 12/25/2017 0959   ALT 39 07/08/2017 0942   BILITOT 0.3 03/26/2019 1413   BILITOT 0.3 12/25/2017 0959   BILITOT 0.42 07/08/2017 0942       Lab Results  Component Value Date   LABCA2 58 (H) 09/14/2012    No components found for: [id number]  No results for input(s): INR in the last 168 hours.  Urinalysis    Component Value Date/Time   COLORURINE YELLOW 11/25/2017 0901   APPEARANCEUR HAZY (A) 11/25/2017 0901   LABSPEC 1.017 11/25/2017 0901   LABSPEC 1.030 07/13/2015 0841   PHURINE 5.0 11/25/2017 0901   GLUCOSEU NEGATIVE 11/25/2017 0901   GLUCOSEU Negative 07/13/2015 0841   HGBUR MODERATE (  A) 11/25/2017 0901   BILIRUBINUR NEGATIVE 11/25/2017 0901   BILIRUBINUR Negative 07/13/2015 0841   KETONESUR NEGATIVE 11/25/2017 0901   PROTEINUR 100 (A) 11/25/2017 0901   UROBILINOGEN 0.2 07/13/2015 0841   NITRITE NEGATIVE 11/25/2017 0901   LEUKOCYTESUR NEGATIVE 11/25/2017 0901   LEUKOCYTESUR Negative 07/13/2015 0841    STUDIES: Mm 3d Screen Breast Uni Left  Result Date: 04/13/2019 CLINICAL DATA:  Screening. EXAM: DIGITAL SCREENING UNILATERAL LEFT MAMMOGRAM WITH CAD AND TOMO COMPARISON:  Previous exam(s). ACR Breast Density Category c: The breast tissue is heterogeneously dense, which may obscure small masses. FINDINGS: There are no findings suspicious for malignancy. Images were processed with CAD. IMPRESSION: No mammographic evidence of malignancy. A result letter of this screening mammogram  will be mailed directly to the patient. RECOMMENDATION: Screening mammogram in one year. (Code:[id number]-B-01Y) BI-RADS CATEGORY  1: Negative. Electronically Signed   By: Dorise Bullion III M.D   On: 04/13/2019 15:46    ASSESSMENT: 68 y.o. [place] woman with stage IV breast cancer, on BOLERO-4 trial  (1) status post right mastectomy and sentinel lymph node sampling 03/22/2004 for a right upper-outer quadrant pT2 pN1, stage IIB invasive ductal carcinoma with lobular features, grade 2, estrogen receptor and progesterone receptor positive, HER-2 negative, with an MIB-1 of 9% ([id number] and [id number])  (2) addition all right axillary lymph node sampling 05/07/2004 showed 2 benign lymph nodes (4 lymph nodes previously removed, so total was one positive lymph node out of 6; [id number])  (3) the patient was evaluated by radiation oncology; no postmastectomy radiation recommended  (4) adjuvant chemotherapy with dose dense doxorubicin and cyclophosphamide x4 cycles (first cycle delayed one week) followed by dose dense paclitaxel x4 was completed 09/18/2004  (5) tamoxifen started March 2006, discontinued 2009  METASTATIC DISEASE: February 2013 (6) presenting with a large pericardial effusion, large right pleural effusion and possible right middle lobe bronchial obstruction February 2013, status post pericardial window placement, fiberoptic bronchoscopy and right Pleurx placement 09/11/2011, with biopsy of the bronchus intermedius and pericardium positive for metastatic breast cancer, estrogen receptor 91% positive with moderate staining intensity, progesterone receptor 100% positive with strong staining intensity, with an MIB-1 of 35%, and no HER-2 amplification, the signals ratio being 1.37 ([id number] [id number])  (7) enrolled in BOLERO-4 trial 10/10/2011, receiving letrozole and everolimus  (a) two small areas of enhancement in the cerebellum noted by brain MRI 10/03/2011 were no longer apparent on repeat MRI 08/21/2012--  most recent brain MRI 04/01/2014 showed no evidence of intracranial metastatic disease  (b) sclerotic lesions in left iliac bone and sacrum have not been biopsied; stable; plan is to start zolendronate after patient updates her dental care (extraction planned)  (c) RLL lung nodule, stable (rescanned 07/12/2014 and 08/11/2014) R hilar and subcarinal lymph nodes: stable  (d) most recent CT of the chest:03/25/2018, stable  (e) CT Angie 12/12/2018 stable  (8) additional problems:  (a) hepatic steatosis  (b) COPD/ emphysema/ asthma  (c) advanced L hip osteoarthritis, status post left total hip replacement 06/07/2015  (d) aortoiliac atherosclerosis  (e) dental evaluation pending w possible dental extractions  (f) likely thalassemia trait  (g) hyperlipidemia  (9) Bone density concerns: DEXA scan 08/11/2013 was normal  (a) repeat DEXA scan 04/09/2018 shows a T score of -1.8, osteopenia  (10) progression of bony disease:  (a) PET scan 12/22/2018 shows no hypermetabolic soft tissue metastases but multifocal bony metastasis  (b) denosumab/Xgeva to start on 02/25/2019, repeated every 28 days (delayed due to dental extraction healing issues)  PLAN: Barbara Parks is doing well today.  She has no clinical sign of progression and is tolerating the Letrozole, Everolimus, and Xgeva well.  She will continue with these.  We did discuss that her tumor markers are slowly rising.  Due to this, we will restage her, and I have placed orders for a CT chest and bone scan to be done prior to her next appointment with [place].  Barbara Parks did request that she move up her appointment by three days, so that she can go to [place] to help her sister, who was just diagnosed with recurrent breast cancer.  I have request those changes.  I recommended that Barbara Parks continue with healthy diet, exercise, and her regular activities, which she plans on doing.    We will see her back in 4 weeks for labs, f/u, and her next injection.  She knows  to call for any questions that may arise between now and her next appointment.  We are happy to see her sooner if needed. She was recommended to continue with the appropriate pandemic precautions.   A total of (20) minutes of face-to-face time was spent with this patient with greater than 50% of that time in counseling and care-coordination.    Wilber Bihari, NP Medical Oncology and Hematology [facility] [address] [place], [place] [address] Tel. [phone]    Fax. [phone]

## 2019-04-23 ENCOUNTER — Telehealth: Payer: Self-pay | Admitting: Adult Health

## 2019-04-23 LAB — CANCER ANTIGEN 27.29: CA 27.29: 310.1 U/mL — ABNORMAL HIGH (ref 0.0–38.6)

## 2019-04-23 NOTE — Telephone Encounter (Signed)
I could not reach regarding schedule  °

## 2019-04-26 ENCOUNTER — Encounter: Payer: Self-pay | Admitting: Oncology

## 2019-04-26 ENCOUNTER — Other Ambulatory Visit: Payer: Self-pay | Admitting: Oncology

## 2019-05-13 ENCOUNTER — Ambulatory Visit (HOSPITAL_COMMUNITY)
Admission: RE | Admit: 2019-05-13 | Discharge: 2019-05-13 | Disposition: A | Discharge status: Home / Self Care | Payer: Medicare Other | Source: Ambulatory Visit | Attending: Adult Health | Admitting: Adult Health

## 2019-05-13 ENCOUNTER — Other Ambulatory Visit: Payer: Self-pay

## 2019-05-13 DIAGNOSIS — C50411 Malignant neoplasm of upper-outer quadrant of right female breast: Secondary | ICD-10-CM | POA: Diagnosis not present

## 2019-05-13 DIAGNOSIS — C50919 Malignant neoplasm of unspecified site of unspecified female breast: Secondary | ICD-10-CM | POA: Diagnosis not present

## 2019-05-13 DIAGNOSIS — C7951 Secondary malignant neoplasm of bone: Secondary | ICD-10-CM

## 2019-05-13 DIAGNOSIS — Z170 Estrogen receptor positive status [ER+]: Secondary | ICD-10-CM

## 2019-05-13 MED ORDER — SODIUM CHLORIDE (PF) 0.9 % IJ SOLN
INTRAMUSCULAR | Status: AC
  Filled 2019-05-13: qty 50

## 2019-05-13 MED ORDER — TECHNETIUM TC 99M MEDRONATE IV KIT
21.6000 | PACK | Freq: Once | INTRAVENOUS | Status: AC
  Administered 2019-05-13: 11:00:00 21.6 via INTRAVENOUS

## 2019-05-13 MED ORDER — IOHEXOL 300 MG/ML  SOLN
75.0000 mL | Freq: Once | INTRAMUSCULAR | Status: AC | PRN
  Administered 2019-05-13: 75 mL via INTRAVENOUS

## 2019-05-17 ENCOUNTER — Other Ambulatory Visit: Payer: Self-pay

## 2019-05-17 ENCOUNTER — Ambulatory Visit (HOSPITAL_COMMUNITY)
Admission: RE | Admit: 2019-05-17 | Discharge: 2019-05-17 | Disposition: A | Discharge status: Home / Self Care | Payer: Medicare Other | Source: Ambulatory Visit | Attending: Adult Health | Admitting: Adult Health

## 2019-05-17 ENCOUNTER — Inpatient Hospital Stay (HOSPITAL_BASED_OUTPATIENT_CLINIC_OR_DEPARTMENT_OTHER): Payer: Medicare Other | Admitting: Adult Health

## 2019-05-17 ENCOUNTER — Inpatient Hospital Stay: Payer: Medicare Other

## 2019-05-17 ENCOUNTER — Encounter: Payer: Self-pay | Admitting: Adult Health

## 2019-05-17 ENCOUNTER — Telehealth: Payer: Self-pay

## 2019-05-17 ENCOUNTER — Inpatient Hospital Stay: Payer: Medicare Other | Attending: Oncology

## 2019-05-17 VITALS — BP 112/69 | HR 88 | Temp 98.7°F | Resp 18 | Ht 64.5 in | Wt 128.0 lb

## 2019-05-17 DIAGNOSIS — Z170 Estrogen receptor positive status [ER+]: Secondary | ICD-10-CM | POA: Insufficient documentation

## 2019-05-17 DIAGNOSIS — I3131 Malignant pericardial effusion in diseases classified elsewhere: Secondary | ICD-10-CM

## 2019-05-17 DIAGNOSIS — C50411 Malignant neoplasm of upper-outer quadrant of right female breast: Secondary | ICD-10-CM

## 2019-05-17 DIAGNOSIS — C50911 Malignant neoplasm of unspecified site of right female breast: Secondary | ICD-10-CM

## 2019-05-17 DIAGNOSIS — C7951 Secondary malignant neoplasm of bone: Secondary | ICD-10-CM | POA: Insufficient documentation

## 2019-05-17 DIAGNOSIS — I313 Pericardial effusion (noninflammatory): Secondary | ICD-10-CM

## 2019-05-17 DIAGNOSIS — C801 Malignant (primary) neoplasm, unspecified: Secondary | ICD-10-CM

## 2019-05-17 DIAGNOSIS — Z471 Aftercare following joint replacement surgery: Secondary | ICD-10-CM | POA: Diagnosis not present

## 2019-05-17 DIAGNOSIS — C7800 Secondary malignant neoplasm of unspecified lung: Secondary | ICD-10-CM

## 2019-05-17 DIAGNOSIS — Z96642 Presence of left artificial hip joint: Secondary | ICD-10-CM | POA: Diagnosis not present

## 2019-05-17 LAB — COMPREHENSIVE METABOLIC PANEL
ALT: 11 U/L (ref 0–44)
AST: 21 U/L (ref 15–41)
Albumin: 3.9 g/dL (ref 3.5–5.0)
Alkaline Phosphatase: 133 U/L — ABNORMAL HIGH (ref 38–126)
Anion gap: 13 (ref 5–15)
BUN: 30 mg/dL — ABNORMAL HIGH (ref 8–23)
CO2: 18 mmol/L — ABNORMAL LOW (ref 22–32)
Calcium: 10.3 mg/dL (ref 8.9–10.3)
Chloride: 109 mmol/L (ref 98–111)
Creatinine, Ser: 1.09 mg/dL — ABNORMAL HIGH (ref 0.44–1.00)
GFR calc Af Amer: >60 mL/min (ref >60)
GFR calc non Af Amer: 52 mL/min — ABNORMAL LOW (ref >60)
Glucose, Bld: 104 mg/dL — ABNORMAL HIGH (ref 70–99)
Potassium: 4.5 mmol/L (ref 3.5–5.1)
Sodium: 140 mmol/L (ref 135–145)
Total Bilirubin: 0.2 mg/dL — ABNORMAL LOW (ref 0.3–1.2)
Total Protein: 8.3 g/dL — ABNORMAL HIGH (ref 6.5–8.1)

## 2019-05-17 LAB — CBC WITH DIFFERENTIAL/PLATELET
Abs Immature Granulocytes: 0.02 10*3/uL (ref 0.00–0.07)
Basophils Absolute: 0.1 10*3/uL (ref 0.0–0.1)
Basophils Relative: 1 %
Eosinophils Absolute: 0.1 10*3/uL (ref 0.0–0.5)
Eosinophils Relative: 1 %
HCT: 46 % (ref 36.0–46.0)
Hemoglobin: 14.8 g/dL (ref 12.0–15.0)
Immature Granulocytes: 0 %
Lymphocytes Relative: 22 %
Lymphs Abs: 1.5 10*3/uL (ref 0.7–4.0)
MCH: 26.6 pg (ref 26.0–34.0)
MCHC: 32.2 g/dL (ref 30.0–36.0)
MCV: 82.6 fL (ref 80.0–100.0)
Monocytes Absolute: 0.8 10*3/uL (ref 0.1–1.0)
Monocytes Relative: 12 %
Neutro Abs: 4.3 10*3/uL (ref 1.7–7.7)
Neutrophils Relative %: 64 %
Platelets: 317 10*3/uL (ref 150–400)
RBC: 5.57 MIL/uL — ABNORMAL HIGH (ref 3.87–5.11)
RDW: 14.3 % (ref 11.5–15.5)
WBC: 6.9 10*3/uL (ref 4.0–10.5)
nRBC: 0 % (ref 0.0–0.2)

## 2019-05-17 MED ORDER — DENOSUMAB 120 MG/1.7ML ~~LOC~~ SOLN
120.0000 mg | Freq: Once | SUBCUTANEOUS | Status: AC
  Administered 2019-05-17: 120 mg via SUBCUTANEOUS

## 2019-05-17 MED ORDER — DENOSUMAB 120 MG/1.7ML ~~LOC~~ SOLN
SUBCUTANEOUS | Status: AC
  Filled 2019-05-17: qty 1.7

## 2019-05-17 NOTE — Telephone Encounter (Signed)
-----   Message from Gardenia Phlegm, NP sent at 05/17/2019 10:55 AM EDT ----- Pleaes call patient.  Hip replacement appears stable. ----- Message ----- From: Interface, Rad Results In Sent: 05/17/2019  10:51 AM EDT To: Gardenia Phlegm, NP

## 2019-05-17 NOTE — Patient Instructions (Signed)
Denosumab injection What is this medicine? DENOSUMAB (den oh sue mab) slows bone breakdown. Prolia is used to treat osteoporosis in women after menopause and in men, and in people who are taking corticosteroids for 6 months or more. Xgeva is used to treat a high calcium level due to cancer and to prevent bone fractures and other bone problems caused by multiple myeloma or cancer bone metastases. Xgeva is also used to treat giant cell tumor of the bone. This medicine may be used for other purposes; ask your health care provider or pharmacist if you have questions. COMMON BRAND NAME(S): Prolia, XGEVA What should I tell my health care provider before I take this medicine? They need to know if you have any of these conditions:  dental disease  having surgery or tooth extraction  infection  kidney disease  low levels of calcium or Vitamin D in the blood  malnutrition  on hemodialysis  skin conditions or sensitivity  thyroid or parathyroid disease  an unusual reaction to denosumab, other medicines, foods, dyes, or preservatives  pregnant or trying to get pregnant  breast-feeding How should I use this medicine? This medicine is for injection under the skin. It is given by a health care professional in a hospital or clinic setting. A special MedGuide will be given to you before each treatment. Be sure to read this information carefully each time. For Prolia, talk to your pediatrician regarding the use of this medicine in children. Special care may be needed. For Xgeva, talk to your pediatrician regarding the use of this medicine in children. While this drug may be prescribed for children as young as 13 years for selected conditions, precautions do apply. Overdosage: If you think you have taken too much of this medicine contact a poison control center or emergency room at once. NOTE: This medicine is only for you. Do not share this medicine with others. What if I miss a dose? It is  important not to miss your dose. Call your doctor or health care professional if you are unable to keep an appointment. What may interact with this medicine? Do not take this medicine with any of the following medications:  other medicines containing denosumab This medicine may also interact with the following medications:  medicines that lower your chance of fighting infection  steroid medicines like prednisone or cortisone This list may not describe all possible interactions. Give your health care provider a list of all the medicines, herbs, non-prescription drugs, or dietary supplements you use. Also tell them if you smoke, drink alcohol, or use illegal drugs. Some items may interact with your medicine. What should I watch for while using this medicine? Visit your doctor or health care professional for regular checks on your progress. Your doctor or health care professional may order blood tests and other tests to see how you are doing. Call your doctor or health care professional for advice if you get a fever, chills or sore throat, or other symptoms of a cold or flu. Do not treat yourself. This drug may decrease your body's ability to fight infection. Try to avoid being around people who are sick. You should make sure you get enough calcium and vitamin D while you are taking this medicine, unless your doctor tells you not to. Discuss the foods you eat and the vitamins you take with your health care professional. See your dentist regularly. Brush and floss your teeth as directed. Before you have any dental work done, tell your dentist you are   receiving this medicine. Do not become pregnant while taking this medicine or for 5 months after stopping it. Talk with your doctor or health care professional about your birth control options while taking this medicine. Women should inform their doctor if they wish to become pregnant or think they might be pregnant. There is a potential for serious side  effects to an unborn child. Talk to your health care professional or pharmacist for more information. What side effects may I notice from receiving this medicine? Side effects that you should report to your doctor or health care professional as soon as possible:  allergic reactions like skin rash, itching or hives, swelling of the face, lips, or tongue  bone pain  breathing problems  dizziness  jaw pain, especially after dental work  redness, blistering, peeling of the skin  signs and symptoms of infection like fever or chills; cough; sore throat; pain or trouble passing urine  signs of low calcium like fast heartbeat, muscle cramps or muscle pain; pain, tingling, numbness in the hands or feet; seizures  unusual bleeding or bruising  unusually weak or tired Side effects that usually do not require medical attention (report to your doctor or health care professional if they continue or are bothersome):  constipation  diarrhea  headache  joint pain  loss of appetite  muscle pain  runny nose  tiredness  upset stomach This list may not describe all possible side effects. Call your doctor for medical advice about side effects. You may report side effects to FDA at 1-800-FDA-1088. Where should I keep my medicine? This medicine is only given in a clinic, doctor's office, or other health care setting and will not be stored at home. NOTE: This sheet is a summary. It may not cover all possible information. If you have questions about this medicine, talk to your doctor, pharmacist, or health care provider.  2020 Elsevier/Gold Standard (2017-11-21 16:10:44)

## 2019-05-17 NOTE — Telephone Encounter (Signed)
Left vm on patient's personal mail.  Informed that hip replacement appears to be stable and to call if any new issues arise.

## 2019-05-17 NOTE — Progress Notes (Signed)
[place]  Telephone:([phone]) [phone] Fax:(336) [phone]    ID: Gaile Allmon Manso DOB: 1951/01/31  MR#: [id number]  YOV#:[id number]  Patient Care Team: [name], MD as PCP - General (Family Medicine) Jerline Pain, MD (Cardiology) Gaye Pollack, MD (Cardiothoracic Surgery) Magrinat, Virgie Dad, MD as Consulting Physician (Oncology) OTHER MD: Dr Erroll Luna- Merlene Laughter, DDS; Gaynelle Arabian MD   CHIEF COMPLAINT: metastatic breast cancer, on BOLERO study  CURRENT TREATMENT: letrozole + everolimus; Delton See   INTERVAL HISTORY: Chastin returns today for follow-up and treatment of her metastatic estrogen receptor positive breast cancer.  She continues on letrozole. She tolerates this well.    She also continues on everolimus.  Her last dose of study drug was on 01/26/2019.  She has continued on this daily off study with good tolerance.    Therma receives [place] every 4 weeks.  She is tolerating this well.  She has no dental concerns, and did note one day of subtle pain in her left hip following her most recent injection.  Since her last visit she underwent CT chest on 05/13/2019 that shows stable disease, and bone scan on 05/13/2019 that shows stable ? Slightly progressed bone disease, however prior bone scan was in 2013.  REVIEW OF SYSTEMS: Riane is feeling well today.  She has her usual rib pain and right hip pain, this is typically after playing golf.  She is only taking one aleve and one tylenol which helps. She only takes this following her injection, and it helps with the achiness caused by her injection.  She is without any appetite decrease, weight loss, nausea, vomiting, bowel/bladder changes, headaches, shortness of breath, cough, chest pain, palpitations, or any other concerns.  She has remained active and is feeling well.  A detailed ROS was otherwise non contributory.   BREAST CANCER HISTORY: From Dr. Collier [name]'s original intake note 04/13/2004:  "This woman has  been in good health all of her life. She recently moved from [place] to work here.  She palpated a mass at about the 12 oclock position in mid-July. She has not noticed any nipple retraction or skin changes.  She was seen by her primary care doctor who subsequently referred her for a mammogram.  Mammogram was performed on 03/01/04. This demonstrated a spiculated 2 cm mass at the 12 oclock position in the right breast.  Upper outer quadrant of the left breast shows some distortion.   Physical exam at that time showed a firm, nontender nodule at the 12 oclock position in the right breast, 5 cm from the nipple.  Ultrasound of this area showed a hypoechoic ill-defined mass, measuring 2.2 x 1.3 x 1.4 cm.  Physical exam of the left breast showed general vague thickening, upper outer quadrant of the left breast with a discrete palpable mass. The ultrasound performed showed a single hypoechoic ill-defined nodule at the 1 oclock position, measuring 7 x 5 x 6 mm.    She had biopsies of both lesions on 03/02/03.  Needle core biopsy of the lesion on the right breast revealed invasive mammary carcinoma.  Needle core biopsy of the left breast showed a complex fibroadenoma.  Prognostic panel of the lesion on the right breast showed it to be ER positive at 73%, PR positive at 90% and proliferative index 9%, HER-2 was 1+.  Patient was referred to Dr. Annamaria Boots, who performed a simple mastectomy with sentinel lymph node evaluation on 03/22/04.  Final pathology showed this to be an invasive ductal carcinoma  with lobular features, measuring 2.2 cm, grade 2 of 3.  Margins negative for carcinoma.  Invasive ductal carcinoma was extended to involve deep dermis of the nipple.  Lymphovascular invasion was identified.  Total of 4 sentinel lymph nodes were evaluated.  Touch imprints at the time of the OR was felt to be negative.  Subsequent evaluation showed a 5 mm focus of metastatic carcinoma in one of the four lymph nodes on microscopic  after sectioning.  There was extracapsular extension  of one lymph node as well. The remaining three lymph nodes were all negative."  The patient's subsequent history is detailed above.   PAST MEDICAL HISTORY: Past Medical History:  Diagnosis Date   Arthritis    left hip   Asthma    breast ca 2005   breast/chemo R mastectomy   Dermatitis    Diabetes mellitus without complication ([place])    GERD (gastroesophageal reflux disease)    Hypercholesteremia    Metastasis to lung ([place]) dx'd 08/2011   Osteopenia due to cancer therapy 09/09/2013   Palpitations    Personal history of chemotherapy    Shortness of breath     PAST SURGICAL HISTORY: Past Surgical History:  Procedure Laterality Date   BREAST SURGERY  2005   right   CATARACT EXTRACTION W/PHACO Left 01/18/2014   Procedure: CATARACT EXTRACTION PHACO AND INTRAOCULAR LENS PLACEMENT ([place]);  Surgeon: Elta Guadeloupe T. Gershon Crane, MD;  Location: [facility];  Service: Ophthalmology;  Laterality: Left;  CDE 15.79   CATARACT EXTRACTION W/PHACO Right 02/08/2014   Procedure: CATARACT EXTRACTION PHACO AND INTRAOCULAR LENS PLACEMENT ([facility]);  Surgeon: Elta Guadeloupe T. Gershon Crane, MD;  Location: [facility];  Service: Ophthalmology;  Laterality: Right;  CDE 4.41   CHEST TUBE INSERTION  09/11/2011   Procedure: INSERTION PLEURAL DRAINAGE CATHETER;  Surgeon: Gaye Pollack, MD;  Location: [facility];  Service: Thoracic;  Laterality: Right;   MASTECTOMY Right    PERICARDIAL WINDOW  09/11/2011   Procedure: PERICARDIAL WINDOW;  Surgeon: Gaye Pollack, MD;  Location: [facility];  Service: Thoracic;  Laterality: N/A;   REMOVAL OF PLEURAL DRAINAGE CATHETER  12/19/2011   Procedure: REMOVAL OF PLEURAL DRAINAGE CATHETER;  Surgeon: Gaye Pollack, MD;  Location: [facility];  Service: Thoracic;  Laterality: Right;  TO BE DONE IN MINOR ROOM, [place] Left 06/07/2015   Procedure: LEFT TOTAL HIP ARTHROPLASTY ANTERIOR APPROACH;  Surgeon: Gaynelle Arabian, MD;  Location: [facility];  Service: Orthopedics;  Laterality: Left;   VIDEO BRONCHOSCOPY  09/11/2011   Procedure: VIDEO BRONCHOSCOPY;  Surgeon: Gaye Pollack, MD;  Location: [facility];  Service: Thoracic;  Laterality: N/A;    FAMILY HISTORY Family History  Problem Relation Age of Onset   Cancer Mother        breast   Heart disease Father    Diabetes Other    Anesthesia problems Neg Hx    The patient's mother was diagnosed with breast cancer at age 80, she died age 7 from congestive heart failure.The patient's father died from heart disease at age 49.  She has one sister alive & well.  Two brothers alive & well, one with diabetes. The patient's sister was also diagnosed with breast cancer, and was tested for the BRCA gene, and was negative. The patient herself has not been tested. There is no history of ovarian cancer in the family   GYNECOLOGIC HISTORY:  No LMP recorded. Patient is postmenopausal. Menarche age  13, the patient is GX P0. She stopped having periods with chemotherapy in 2005. 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 She was a Archivist. She is single, lives alone with her Shitzu-poodle Sammie.. Family is all in the [place] area.     ADVANCED DIRECTIVES: Not in place. At the 02/23/2014 visit the patient was given the appropriate documents to complete and notarize at her discretion. She tells me she is planning to name her sister, Billie Ruddy, as healthcare power of attorney. [name] can be reached at [phone]   HEALTH MAINTENANCE: Social History   Tobacco Use   Smoking status: Former Smoker    Packs/day: 1.50    Years: 30.00    Pack years: 45.00    Types: Cigarettes    Quit date: 09/10/2007    Years since quitting: 11.6   Smokeless tobacco: Never Used  Substance Use Topics   Alcohol use: No   Drug use: No     Colonoscopy:  PAP:  Bone density: 04/09/2018, T score -1.8  Lipid panel:  Allergies  Allergen Reactions   Aspirin     REACTION: upset stomach   Latex Other (See Comments)    Blistering and skin peels off   Nsaids Nausea And Vomiting    Extreme nausea and vomiting    Current Outpatient Medications  Medication Sig Dispense Refill   cholecalciferol (VITAMIN D3) 25 MCG (1000 UT) tablet Take 2 tablets (2,000 Units total) by mouth daily. 180 tablet 4   cyclobenzaprine (FLEXERIL) 10 MG tablet Take 1 tablet (10 mg total) by mouth 2 (two) times daily as needed for muscle spasms. 20 tablet 0   everolimus (AFINITOR) 10 MG tablet Take 1 tablet (10 mg total) by mouth daily. 28 tablet 6   gemfibrozil (LOPID) 600 MG tablet TAKE 1 TABLET (600 MG TOTAL) BY MOUTH 2 (TWO) TIMES DAILY BEFORE A MEAL. 180 tablet 1   letrozole (FEMARA) 2.5 MG tablet Take 1 tablet (2.5 mg total) by mouth daily. 90 tablet 3   loperamide (IMODIUM) 2 MG capsule Take 2 mg by mouth 4 (four) times daily as needed for diarrhea or loose stools. Reported on 11/30/2015     metFORMIN (GLUCOPHAGE-XR) 500 MG 24 hr tablet Take 1 tablet (500 mg total) by mouth 3 (three) times daily before meals.  3   traMADol (ULTRAM) 50 MG tablet Take 0.5-1 tablets (25-50 mg total) by mouth every 6 (six) hours as needed. 30 tablet 0   No current facility-administered medications for this visit.     OBJECTIVE:  Vitals:   05/17/19 0829  BP: 112/69  Pulse: 88  Resp: 18  Temp: 98.7 F (37.1 C)  SpO2: 100%     Body mass index is 21.63 kg/m.   Filed Weights   05/17/19 0829  Weight: 128 lb (58.1 kg)   ECOG FS:1 - Symptomatic but completely ambulatory GENERAL: Patient is a well appearing female in no acute distress HEENT:  Sclerae anicteric.  Oropharynx clear and moist. No ulcerations or evidence of oropharyngeal candidiasis. Neck is supple.  NODES:  No cervical, supraclavicular, or axillary lymphadenopathy palpated.  BREAST EXAM:   deferred LUNGS:  Clear to auscultation bilaterally.  No wheezes or rhonchi. HEART:  Regular rate and rhythm. No murmur appreciated. ABDOMEN:  Soft, nontender.  Positive, normoactive bowel sounds. No organomegaly palpated. MSK:  No focal spinal tenderness to palpation.  Full range of motion bilaterally in the upper extremities. EXTREMITIES:  No peripheral edema.   SKIN:  Clear with no obvious rashes or skin changes. No nail dyscrasia. NEURO:  Nonfocal. Well oriented.  Appropriate affect.     LAB  RESULTS:  CMP     Component Value Date/Time   NA 140 05/17/2019 0814   NA 139 07/08/2017 0942   K 4.5 05/17/2019 0814   K 3.8 07/08/2017 0942   CL 109 05/17/2019 0814   CL 109 (H) 12/31/2012 0940   CO2 18 (L) 05/17/2019 0814   CO2 18 (L) 07/08/2017 0942   GLUCOSE 104 (H) 05/17/2019 0814   GLUCOSE 156 (H) 07/08/2017 0942   GLUCOSE 127 (H) 12/31/2012 0940   BUN 30 (H) 05/17/2019 0814   BUN 17.7 07/08/2017 0942   CREATININE 1.09 (H) 05/17/2019 0814   CREATININE 1.18 (H) 12/25/2017 0959   CREATININE 1.0 07/08/2017 0942   CALCIUM 10.3 05/17/2019 0814   CALCIUM 10.4 07/08/2017 0942   PROT 8.3 (H) 05/17/2019 0814   PROT 7.7 07/08/2017 0942   ALBUMIN 3.9 05/17/2019 0814   ALBUMIN 3.4 (L) 07/08/2017 0942   AST 21 05/17/2019 0814   AST 50 (H) 12/25/2017 0959   AST 54 (H) 07/08/2017 0942   ALT 11 05/17/2019 0814   ALT 53 12/25/2017 0959   ALT 39 07/08/2017 0942   ALKPHOS 133 (H) 05/17/2019 0814   ALKPHOS 232 (H) 07/08/2017 0942   BILITOT 0.2 (L) 05/17/2019 0814   BILITOT 0.3 12/25/2017 0959   BILITOT 0.42 07/08/2017 0942   GFRNONAA 52 (L) 05/17/2019 0814   GFRNONAA 47 (L) 12/25/2017 0959   GFRAA >60 05/17/2019 0814   GFRAA 54 (L) 12/25/2017 0959    I No results found for: SPEP  Lab Results  Component Value Date   WBC 6.9 05/17/2019   NEUTROABS 4.3 05/17/2019   HGB 14.8 05/17/2019   HCT 46.0 05/17/2019   MCV 82.6 05/17/2019   PLT 317 05/17/2019      Chemistry       Component Value Date/Time   NA 140 05/17/2019 0814   NA 139 07/08/2017 0942   K 4.5 05/17/2019 0814   K 3.8 07/08/2017 0942   CL 109 05/17/2019 0814   CL 109 (H) 12/31/2012 0940   CO2 18 (L) 05/17/2019 0814   CO2 18 (L) 07/08/2017 0942   BUN 30 (H) 05/17/2019 0814   BUN 17.7 07/08/2017 0942   CREATININE 1.09 (H) 05/17/2019 0814   CREATININE 1.18 (H) 12/25/2017 0959   CREATININE 1.0 07/08/2017 0942      Component Value Date/Time   CALCIUM 10.3 05/17/2019 0814   CALCIUM 10.4 07/08/2017 0942   ALKPHOS 133 (H) 05/17/2019 0814   ALKPHOS 232 (H) 07/08/2017 0942   AST 21 05/17/2019 0814   AST 50 (H) 12/25/2017 0959   AST 54 (H) 07/08/2017 0942   ALT 11 05/17/2019 0814   ALT 53 12/25/2017 0959   ALT 39 07/08/2017 0942   BILITOT 0.2 (L) 05/17/2019 0814   BILITOT 0.3 12/25/2017 0959   BILITOT 0.42 07/08/2017 0942       Lab Results  Component Value Date   LABCA2 58 (H) 09/14/2012    No components found for: [id number]  No results for input(s): INR in the last 168 hours.  Urinalysis    Component Value Date/Time   COLORURINE YELLOW 11/25/2017 0901   APPEARANCEUR HAZY (A) 11/25/2017 0901   LABSPEC 1.017 11/25/2017 0901   LABSPEC 1.030 07/13/2015 3810  PHURINE 5.0 11/25/2017 0901   GLUCOSEU NEGATIVE 11/25/2017 0901   GLUCOSEU Negative 07/13/2015 0841   HGBUR MODERATE (A) 11/25/2017 0901   BILIRUBINUR NEGATIVE 11/25/2017 0901   BILIRUBINUR Negative 07/13/2015 0841   KETONESUR NEGATIVE 11/25/2017 0901   PROTEINUR 100 (A) 11/25/2017 0901   UROBILINOGEN 0.2 07/13/2015 0841   NITRITE NEGATIVE 11/25/2017 0901   LEUKOCYTESUR NEGATIVE 11/25/2017 0901   LEUKOCYTESUR Negative 07/13/2015 0841    STUDIES: Ct Chest W Contrast  Result Date: 05/13/2019 CLINICAL DATA:  Stage IV invasive breast cancer. Diagnosed in 2005. Bone metastasis 2013. Right mastectomy 2005. Prior chemotherapy and lymph node dissection. Oral chemotherapy in progress. EXAM: CT CHEST WITH CONTRAST TECHNIQUE:  Multidetector CT imaging of the chest was performed during intravenous contrast administration. CONTRAST:  100m OMNIPAQUE IOHEXOL 300 MG/ML SOLN 75 cc of Omnipaque 300 COMPARISON:  CTA of the chest of 12/12/2018. Standard diagnostic chest CT of 11/19/2018 is directly reviewed. FINDINGS: Cardiovascular: Advanced aortic and branch vessel atherosclerosis. Tortuous thoracic aorta. Normal heart size, without pericardial effusion. No central pulmonary embolism, on this non-dedicated study. Mediastinum/Nodes: No supraclavicular adenopathy. Right axillary node dissection. No axillary adenopathy. No mediastinal or hilar adenopathy. Lungs/Pleura: Minimal right pleural thickening is unchanged. Trace right pleural fluid is felt to be slightly increased. Centrilobular emphysema. Right lower lobe pulmonary nodule measures 1.6 x 1.1 cm on 01/20 1/6, similar. The posterior right upper lobe nodule detailed on the 11/19/2018 exam has resolved. The central right lower lobe nodule or less likely lymph node posterior to the right lower lobe bronchus measures 1.1 cm on 80/6 and is similar. Subpleural pulmonary nodules within the right lower lobe including at maximally 4 mm are felt to be unchanged. Upper Abdomen: Normal imaged portions of the liver, spleen, stomach, pancreas, right adrenal gland, kidneys. Unchanged left adrenal nodularity including at 1.2 cm. Soft tissue density within the gallbladder fundus is similar at 1.1 cm, favored to represent an area of adenomyomatosis. Musculoskeletal: Right mastectomy. Sclerosis and expansion of the posteromedial left eleventh rib is similar on 117/3. Right ninth rib heterogeneity and mild expansion are somewhat more conspicuous today. Lucency within the scapular body a on 55/3 is new or more conspicuous today. IMPRESSION: 1. Compared to the 11/19/2018 exam, primarily similar pulmonary nodules. The right upper lobe nodule which was new on that study has resolved. 2. No thoracic adenopathy. 3.  Osseous metastasis, felt to be slightly progressive compared to the prior. 4.  Aortic Atherosclerosis (ICD10-I70.0).  Emphysema (ICD10-J43.9). 5. Chronic right pleural thickening with trace right pleural fluid, felt to be slightly increased. 6. Left adrenal adenoma. Electronically Signed   By: KAbigail MiyamotoM.D.   On: 05/13/2019 14:27   Nm Bone Scan Whole Body  Result Date: 05/14/2019 CLINICAL DATA:  Invasive breast cancer. EXAM: NUCLEAR MEDICINE WHOLE BODY BONE SCAN TECHNIQUE: Whole body anterior and posterior images were obtained approximately 3 hours after intravenous injection of radiopharmaceutical. RADIOPHARMACEUTICALS:  21.6 mCi Technetium-924mDP IV COMPARISON:  PET-CT 12/22/2018. FINDINGS: Bilateral renal function and excretion. Increased activity again noted over the right scapula, bilateral lower posterior ribs, and right femoral head. Similar findings noted on prior PET-CT of 12/22/2018. A subtle focus of increased activity is also noted over the inferior aspect of the left scapula, left humeral head, and right anterior third and sixth ribs. Findings are consistent with metastatic disease. Patient has a left hip replacement. Subtle area of increased activity noted in the intertrochanteric region of the left hip. Although this could represent prosthesis loosening, this may  also represent a metastatic lesion. IMPRESSION: 1. Increased activity again noted over the right scapula, bilateral lower posterior ribs, and right femoral head. Similar findings noted on prior PET-CT of 12/22/2018. 2. On today's exam subtle focus of increased activity is noted over the inferior aspect of the left scapula, left humeral head, and right anterior third and sixth ribs. These findings are consistent with metastatic disease. 3. A subtle area of increased activity noted in the intertrochanteric region of the left hip. Patient has a left hip replacement. Although this area of subtle increased activity could represent  prosthesis loosening, this may also represent a metastatic lesion. Electronically Signed   By: Marcello Moores  Register   On: 05/14/2019 06:23    ASSESSMENT: 68 y.o. [place], [place] woman with stage IV breast cancer, on BOLERO-4 trial  (1) status post right mastectomy and sentinel lymph node sampling 03/22/2004 for a right upper-outer quadrant pT2 pN1, stage IIB invasive ductal carcinoma with lobular features, grade 2, estrogen receptor and progesterone receptor positive, HER-2 negative, with an MIB-1 of 9% ([id number] and [id number])  (2) addition all right axillary lymph node sampling 05/07/2004 showed 2 benign lymph nodes (4 lymph nodes previously removed, so total was one positive lymph node out of 6; [id number])  (3) the patient was evaluated by radiation oncology; no postmastectomy radiation recommended  (4) adjuvant chemotherapy with dose dense doxorubicin and cyclophosphamide x4 cycles (first cycle delayed one week) followed by dose dense paclitaxel x4 was completed 09/18/2004  (5) tamoxifen started March 2006, discontinued 2009  METASTATIC DISEASE: February 2013 (6) presenting with a large pericardial effusion, large right pleural effusion and possible right middle lobe bronchial obstruction February 2013, status post pericardial window placement, fiberoptic bronchoscopy and right Pleurx placement 09/11/2011, with biopsy of the bronchus intermedius and pericardium positive for metastatic breast cancer, estrogen receptor 91% positive with moderate staining intensity, progesterone receptor 100% positive with strong staining intensity, with an MIB-1 of 35%, and no HER-2 amplification, the signals ratio being 1.37 ([id number] [id number])  (7) enrolled in BOLERO-4 trial 10/10/2011, receiving letrozole and everolimus  (a) two small areas of enhancement in the cerebellum noted by brain MRI 10/03/2011 were no longer apparent on repeat MRI 08/21/2012-- most recent brain MRI 04/01/2014 showed no evidence of intracranial  metastatic disease  (b) sclerotic lesions in left iliac bone and sacrum have not been biopsied; stable; plan is to start zolendronate after patient updates her dental care (extraction planned)  (c) RLL lung nodule, stable (rescanned 07/12/2014 and 08/11/2014) R hilar and subcarinal lymph nodes: stable  (d) most recent CT of the chest:03/25/2018, stable  (e) CT Angie 12/12/2018 stable  (8) additional problems:  (a) hepatic steatosis  (b) COPD/ emphysema/ asthma  (c) advanced L hip osteoarthritis, status post left total hip replacement 06/07/2015  (d) aortoiliac atherosclerosis  (e) dental evaluation pending w possible dental extractions  (f) likely thalassemia trait  (g) hyperlipidemia  (9) Bone density concerns: DEXA scan 08/11/2013 was normal  (a) repeat DEXA scan 04/09/2018 shows a T score of -1.8, osteopenia  (10) progression of bony disease:  (a) PET scan 12/22/2018 shows no hypermetabolic soft tissue metastases but multifocal bony metastasis  (b) denosumab/Xgeva started on 02/25/2019, repeated every 28 days (delayed due to dental extraction healing issues)   PLAN: Noralee is doing well today. She continues on Letrozole and Everolimus with good tolerance.  She receives [place] every four weeks and will receive her fourth dose today.  She is tolerating this well and has no dental concerns.  She has no signs of clinical progression and we are continuing to monitor her tumor markers which are slightly increasing.  Her restaging shows no progression in her pulmonary metastases and ? Progression on bone scan.  This is the first bone scan that she has had since she was noted to have disease in the bones.  For now, we will continue the Xgeva and monitor her scans, which should be repeated in 07/2019, or sooner if she develops clinical signs of progression.  I reviewed this with her.    The bone scan did highlight a suspicion for her hip replacement hardware to have loosened.  I ordered plain films  of the hip today to further evaluate.  She will f/u with Dr. Maureen Ralphs about her hip replacement and bursitis in 05/2019.    We will see Tashara back in 4 weeks for labs, f/u, and her next injection.  She knows to call for any questions that may arise between now and her next appointment.  We are happy to see her sooner if needed. She was recommended to continue with the appropriate pandemic precautions.   A total of (30) minutes of face-to-face time was spent with this patient with greater than 50% of that time in counseling and care-coordination.    Wilber Bihari, NP Medical Oncology and Hematology [facility] [address] [place], [place] [address] Tel. [phone]    Fax. [phone]

## 2019-05-18 ENCOUNTER — Telehealth: Payer: Self-pay | Admitting: Adult Health

## 2019-05-18 NOTE — Telephone Encounter (Signed)
Scheduled appt per 10/19 los.  Left a voice message of the appt date and time.

## 2019-05-20 ENCOUNTER — Ambulatory Visit: Payer: Medicare Other

## 2019-05-20 ENCOUNTER — Ambulatory Visit: Payer: Medicare Other | Admitting: Adult Health

## 2019-05-20 ENCOUNTER — Other Ambulatory Visit: Payer: Medicare Other

## 2019-05-23 ENCOUNTER — Other Ambulatory Visit: Payer: Self-pay | Admitting: Oncology

## 2019-06-14 ENCOUNTER — Inpatient Hospital Stay (HOSPITAL_BASED_OUTPATIENT_CLINIC_OR_DEPARTMENT_OTHER): Payer: Medicare Other | Admitting: Adult Health

## 2019-06-14 ENCOUNTER — Other Ambulatory Visit: Payer: Self-pay

## 2019-06-14 ENCOUNTER — Encounter: Payer: Self-pay | Admitting: Adult Health

## 2019-06-14 ENCOUNTER — Inpatient Hospital Stay: Payer: Medicare Other | Attending: Oncology

## 2019-06-14 ENCOUNTER — Inpatient Hospital Stay: Payer: Medicare Other

## 2019-06-14 ENCOUNTER — Telehealth: Payer: Self-pay | Admitting: Adult Health

## 2019-06-14 VITALS — BP 111/63 | HR 93 | Temp 98.7°F | Resp 18 | Ht 64.5 in | Wt 129.4 lb

## 2019-06-14 DIAGNOSIS — Z170 Estrogen receptor positive status [ER+]: Secondary | ICD-10-CM

## 2019-06-14 DIAGNOSIS — C7951 Secondary malignant neoplasm of bone: Secondary | ICD-10-CM

## 2019-06-14 DIAGNOSIS — C50411 Malignant neoplasm of upper-outer quadrant of right female breast: Secondary | ICD-10-CM | POA: Diagnosis not present

## 2019-06-14 DIAGNOSIS — C50911 Malignant neoplasm of unspecified site of right female breast: Secondary | ICD-10-CM | POA: Diagnosis not present

## 2019-06-14 DIAGNOSIS — C801 Malignant (primary) neoplasm, unspecified: Secondary | ICD-10-CM

## 2019-06-14 DIAGNOSIS — C7800 Secondary malignant neoplasm of unspecified lung: Secondary | ICD-10-CM | POA: Diagnosis not present

## 2019-06-14 DIAGNOSIS — I3131 Malignant pericardial effusion in diseases classified elsewhere: Secondary | ICD-10-CM

## 2019-06-14 LAB — COMPREHENSIVE METABOLIC PANEL
ALT: 13 U/L (ref 0–44)
AST: 21 U/L (ref 15–41)
Albumin: 4.1 g/dL (ref 3.5–5.0)
Alkaline Phosphatase: 157 U/L — ABNORMAL HIGH (ref 38–126)
Anion gap: 13 (ref 5–15)
BUN: 33 mg/dL — ABNORMAL HIGH (ref 8–23)
CO2: 18 mmol/L — ABNORMAL LOW (ref 22–32)
Calcium: 10 mg/dL (ref 8.9–10.3)
Chloride: 107 mmol/L (ref 98–111)
Creatinine, Ser: 1.32 mg/dL — ABNORMAL HIGH (ref 0.44–1.00)
GFR calc Af Amer: 48 mL/min — ABNORMAL LOW (ref >60)
GFR calc non Af Amer: 41 mL/min — ABNORMAL LOW (ref >60)
Glucose, Bld: 110 mg/dL — ABNORMAL HIGH (ref 70–99)
Potassium: 4.3 mmol/L (ref 3.5–5.1)
Sodium: 138 mmol/L (ref 135–145)
Total Bilirubin: 0.3 mg/dL (ref 0.3–1.2)
Total Protein: 8.6 g/dL — ABNORMAL HIGH (ref 6.5–8.1)

## 2019-06-14 LAB — CBC WITH DIFFERENTIAL/PLATELET
Abs Immature Granulocytes: 0.03 10*3/uL (ref 0.00–0.07)
Basophils Absolute: 0.1 10*3/uL (ref 0.0–0.1)
Basophils Relative: 1 %
Eosinophils Absolute: 0.1 10*3/uL (ref 0.0–0.5)
Eosinophils Relative: 1 %
HCT: 46.5 % — ABNORMAL HIGH (ref 36.0–46.0)
Hemoglobin: 15.1 g/dL — ABNORMAL HIGH (ref 12.0–15.0)
Immature Granulocytes: 0 %
Lymphocytes Relative: 16 %
Lymphs Abs: 1.2 10*3/uL (ref 0.7–4.0)
MCH: 26.4 pg (ref 26.0–34.0)
MCHC: 32.5 g/dL (ref 30.0–36.0)
MCV: 81.3 fL (ref 80.0–100.0)
Monocytes Absolute: 0.8 10*3/uL (ref 0.1–1.0)
Monocytes Relative: 11 %
Neutro Abs: 5.3 10*3/uL (ref 1.7–7.7)
Neutrophils Relative %: 71 %
Platelets: 324 10*3/uL (ref 150–400)
RBC: 5.72 MIL/uL — ABNORMAL HIGH (ref 3.87–5.11)
RDW: 14.1 % (ref 11.5–15.5)
WBC: 7.4 10*3/uL (ref 4.0–10.5)
nRBC: 0 % (ref 0.0–0.2)

## 2019-06-14 MED ORDER — DENOSUMAB 120 MG/1.7ML ~~LOC~~ SOLN
120.0000 mg | Freq: Once | SUBCUTANEOUS | Status: AC
  Administered 2019-06-14: 120 mg via SUBCUTANEOUS

## 2019-06-14 MED ORDER — DENOSUMAB 120 MG/1.7ML ~~LOC~~ SOLN
SUBCUTANEOUS | Status: AC
  Filled 2019-06-14: qty 1.7

## 2019-06-14 NOTE — Patient Instructions (Signed)
Denosumab injection What is this medicine? DENOSUMAB (den oh sue mab) slows bone breakdown. Prolia is used to treat osteoporosis in women after menopause and in men, and in people who are taking corticosteroids for 6 months or more. Xgeva is used to treat a high calcium level due to cancer and to prevent bone fractures and other bone problems caused by multiple myeloma or cancer bone metastases. Xgeva is also used to treat giant cell tumor of the bone. This medicine may be used for other purposes; ask your health care provider or pharmacist if you have questions. COMMON BRAND NAME(S): Prolia, XGEVA What should I tell my health care provider before I take this medicine? They need to know if you have any of these conditions:  dental disease  having surgery or tooth extraction  infection  kidney disease  low levels of calcium or Vitamin D in the blood  malnutrition  on hemodialysis  skin conditions or sensitivity  thyroid or parathyroid disease  an unusual reaction to denosumab, other medicines, foods, dyes, or preservatives  pregnant or trying to get pregnant  breast-feeding How should I use this medicine? This medicine is for injection under the skin. It is given by a health care professional in a hospital or clinic setting. A special MedGuide will be given to you before each treatment. Be sure to read this information carefully each time. For Prolia, talk to your pediatrician regarding the use of this medicine in children. Special care may be needed. For Xgeva, talk to your pediatrician regarding the use of this medicine in children. While this drug may be prescribed for children as young as 13 years for selected conditions, precautions do apply. Overdosage: If you think you have taken too much of this medicine contact a poison control center or emergency room at once. NOTE: This medicine is only for you. Do not share this medicine with others. What if I miss a dose? It is  important not to miss your dose. Call your doctor or health care professional if you are unable to keep an appointment. What may interact with this medicine? Do not take this medicine with any of the following medications:  other medicines containing denosumab This medicine may also interact with the following medications:  medicines that lower your chance of fighting infection  steroid medicines like prednisone or cortisone This list may not describe all possible interactions. Give your health care provider a list of all the medicines, herbs, non-prescription drugs, or dietary supplements you use. Also tell them if you smoke, drink alcohol, or use illegal drugs. Some items may interact with your medicine. What should I watch for while using this medicine? Visit your doctor or health care professional for regular checks on your progress. Your doctor or health care professional may order blood tests and other tests to see how you are doing. Call your doctor or health care professional for advice if you get a fever, chills or sore throat, or other symptoms of a cold or flu. Do not treat yourself. This drug may decrease your body's ability to fight infection. Try to avoid being around people who are sick. You should make sure you get enough calcium and vitamin D while you are taking this medicine, unless your doctor tells you not to. Discuss the foods you eat and the vitamins you take with your health care professional. See your dentist regularly. Brush and floss your teeth as directed. Before you have any dental work done, tell your dentist you are   receiving this medicine. Do not become pregnant while taking this medicine or for 5 months after stopping it. Talk with your doctor or health care professional about your birth control options while taking this medicine. Women should inform their doctor if they wish to become pregnant or think they might be pregnant. There is a potential for serious side  effects to an unborn child. Talk to your health care professional or pharmacist for more information. What side effects may I notice from receiving this medicine? Side effects that you should report to your doctor or health care professional as soon as possible:  allergic reactions like skin rash, itching or hives, swelling of the face, lips, or tongue  bone pain  breathing problems  dizziness  jaw pain, especially after dental work  redness, blistering, peeling of the skin  signs and symptoms of infection like fever or chills; cough; sore throat; pain or trouble passing urine  signs of low calcium like fast heartbeat, muscle cramps or muscle pain; pain, tingling, numbness in the hands or feet; seizures  unusual bleeding or bruising  unusually weak or tired Side effects that usually do not require medical attention (report to your doctor or health care professional if they continue or are bothersome):  constipation  diarrhea  headache  joint pain  loss of appetite  muscle pain  runny nose  tiredness  upset stomach This list may not describe all possible side effects. Call your doctor for medical advice about side effects. You may report side effects to FDA at 1-800-FDA-1088. Where should I keep my medicine? This medicine is only given in a clinic, doctor's office, or other health care setting and will not be stored at home. NOTE: This sheet is a summary. It may not cover all possible information. If you have questions about this medicine, talk to your doctor, pharmacist, or health care provider.  2020 Elsevier/Gold Standard (2017-11-21 16:10:44)

## 2019-06-14 NOTE — Telephone Encounter (Signed)
I left a message regarding schedule  

## 2019-06-14 NOTE — Progress Notes (Signed)
[place]  [phone] [phone]    ID: Barbara Parks DOB: 10-02-1950  MR#: [id number]  IRC#:[id number]  Patient Care Team: Barbara Small, MD as PCP - General (Family Medicine) Barbara Pain, MD (Cardiology) Barbara Pollack, MD (Cardiothoracic Surgery) Parks, Barbara Dad, MD as Consulting Physician (Oncology) OTHER MD: Dr Barbara Parks- Barbara Parks, DDS; Barbara Arabian MD   CHIEF COMPLAINT: metastatic breast cancer, on BOLERO study  CURRENT TREATMENT: letrozole + everolimus; Delton See   INTERVAL HISTORY: Barbara Parks returns today for follow-up and treatment of her metastatic estrogen receptor positive breast cancer.  She continues on letrozole. She tolerates this well.    She also continues on everolimus.  Her last dose of study drug was on 01/26/2019.  She has continued on this daily off study with good tolerance.    Barbara Parks receives [place] every 4 weeks.    After her last visit we got plain films of her left hip on 05/17/2019 to check the hardware as the bone scan questioned this area.  The plain films showed that the hardware was in place and the xray was normal.   REVIEW OF SYSTEMS: Barbara Parks is doing well today.  She notes that she has occasional rib cage Parks on her right lower posterior ribs.  She says they get achy about 2 days prior to when it is time to receive Her Xgeva.  She says after that it improves.  She denies any oral issues, arthralgias, fever, chills, vaginal dryness, hot flashes, bowel/bladder changes, nausea, vomiting, unintentional weight loss,  Cough, shortness of breath, chest Parks, palpitations, or skin issues.    Barbara Parks has been playing golf and working in the yard.  She enjoys this.    BREAST CANCER HISTORY: From Dr. Collier Salina Parks's original intake note 04/13/2004:  "This woman has been in good health all of her life. She recently moved from [place] to work here.  She palpated a mass at about the 12 oclock position in mid-July.  She has not noticed any nipple retraction or skin changes.  She was seen by her primary care doctor who subsequently referred her for a mammogram.  Mammogram was performed on 03/01/04. This demonstrated a spiculated 2 cm mass at the 12 oclock position in the right breast.  Upper outer quadrant of the left breast shows some distortion.   Physical exam at that time showed a firm, nontender nodule at the 12 oclock position in the right breast, 5 cm from the nipple.  Ultrasound of this area showed a hypoechoic ill-defined mass, measuring 2.2 x 1.3 x 1.4 cm.  Physical exam of the left breast showed general vague thickening, upper outer quadrant of the left breast with a discrete palpable mass. The ultrasound performed showed a single hypoechoic ill-defined nodule at the 1 oclock position, measuring 7 x 5 x 6 mm.    She had biopsies of both lesions on 03/02/03.  Needle core biopsy of the lesion on the right breast revealed invasive mammary carcinoma.  Needle core biopsy of the left breast showed a complex fibroadenoma.  Prognostic panel of the lesion on the right breast showed it to be ER positive at 73%, PR positive at 90% and proliferative index 9%, HER-2 was 1+.  Patient was referred to Dr. Annamaria Parks, who performed a simple mastectomy with sentinel lymph node evaluation on 03/22/04.  Final pathology showed this to be an invasive ductal carcinoma  with lobular features, measuring 2.2 cm, grade 2 of 3.  Margins negative for carcinoma.  Invasive ductal carcinoma was extended to involve deep dermis of the nipple.  Lymphovascular invasion was identified.  Total of 4 sentinel lymph nodes were evaluated.  Touch imprints at the time of the OR was felt to be negative.  Subsequent evaluation showed a 5 mm focus of metastatic carcinoma in one of the four lymph nodes on microscopic after sectioning.  There was extracapsular extension  of one lymph node as well. The remaining three lymph nodes were all negative."  The patient's  subsequent history is detailed above.   PAST MEDICAL HISTORY: Past Medical History:  Diagnosis Date   Arthritis    left hip   Asthma    breast ca 2005   breast/chemo R mastectomy   Dermatitis    Diabetes mellitus without complication ([place])    GERD (gastroesophageal reflux disease)    Hypercholesteremia    Metastasis to lung ([place]) dx'd 08/2011   Osteopenia due to cancer therapy 09/09/2013   Palpitations    Personal history of chemotherapy    Shortness of breath     PAST SURGICAL HISTORY: Past Surgical History:  Procedure Laterality Date   BREAST SURGERY  2005   right   CATARACT EXTRACTION W/PHACO Left 01/18/2014   Procedure: CATARACT EXTRACTION PHACO AND INTRAOCULAR LENS PLACEMENT ([place]);  Surgeon: Elta Guadeloupe T. Gershon Crane, MD;  Location: [facility];  Service: Ophthalmology;  Laterality: Left;  CDE 15.79   CATARACT EXTRACTION W/PHACO Right 02/08/2014   Procedure: CATARACT EXTRACTION PHACO AND INTRAOCULAR LENS PLACEMENT ([facility]);  Surgeon: Elta Guadeloupe T. Gershon Crane, MD;  Location: [facility];  Service: Ophthalmology;  Laterality: Right;  CDE 4.41   CHEST TUBE INSERTION  09/11/2011   Procedure: INSERTION PLEURAL DRAINAGE CATHETER;  Surgeon: Barbara Pollack, MD;  Location: [facility];  Service: Thoracic;  Laterality: Right;   MASTECTOMY Right    PERICARDIAL WINDOW  09/11/2011   Procedure: PERICARDIAL WINDOW;  Surgeon: Barbara Pollack, MD;  Location: [facility];  Service: Thoracic;  Laterality: N/A;   REMOVAL OF PLEURAL DRAINAGE CATHETER  12/19/2011   Procedure: REMOVAL OF PLEURAL DRAINAGE CATHETER;  Surgeon: Barbara Pollack, MD;  Location: [facility];  Service: Thoracic;  Laterality: Right;  TO BE DONE IN MINOR ROOM, [place] Left 06/07/2015   Procedure: LEFT TOTAL HIP ARTHROPLASTY ANTERIOR APPROACH;  Surgeon: Barbara Arabian, MD;  Location: [facility];  Service: Orthopedics;  Laterality: Left;   VIDEO BRONCHOSCOPY  09/11/2011   Procedure: VIDEO BRONCHOSCOPY;  Surgeon:  Barbara Pollack, MD;  Location: [facility];  Service: Thoracic;  Laterality: N/A;    FAMILY HISTORY Family History  Problem Relation Age of Onset   Cancer Mother        breast   Heart disease Father    Diabetes Other    Anesthesia problems Neg Hx    The patient's mother was diagnosed with breast cancer at age 47, she died age 3 from congestive heart failure.The patient's father died from heart disease at age 32.  She has one sister alive & well.  Two brothers alive & well, one with diabetes. The patient's sister was also diagnosed with breast cancer, and was tested for the BRCA gene, and was negative. The patient herself has not been tested. There is no history of ovarian cancer in the family   GYNECOLOGIC HISTORY:  No LMP recorded. Patient is postmenopausal. Menarche age 58, the patient is GX P0. She stopped having periods with chemotherapy in 2005. 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 She was a Archivist. She is single, lives alone with her Shitzu-poodle Sammie.. Family is all in the [place] area.     ADVANCED DIRECTIVES: Not in place. At the 02/23/2014 visit the patient was given the appropriate documents to complete and notarize at her discretion. She tells me she is planning to name her sister, Barbara Parks, as healthcare power of attorney. [name] can be reached at [phone]   HEALTH MAINTENANCE: Social History   Tobacco Use   Smoking status: Former Smoker    Packs/day: 1.50    Years: 30.00    Pack years: 45.00    Types: Cigarettes    Quit date: 09/10/2007    Years since quitting: 11.7   Smokeless tobacco: Never Used  Substance Use Topics   Alcohol use: No   Drug use: No    Colonoscopy:  PAP:  Bone density: 04/09/2018, T score -1.8  Lipid panel:  Allergies  Allergen Reactions   Aspirin     REACTION: upset stomach   Latex Other  (See Comments)    Blistering and skin peels off   Nsaids Nausea And Vomiting    Extreme nausea and vomiting    Current Outpatient Medications  Medication Sig Dispense Refill   cholecalciferol (VITAMIN D3) 25 MCG (1000 UT) tablet Take 2 tablets (2,000 Units total) by mouth daily. 180 tablet 4   cyclobenzaprine (FLEXERIL) 10 MG tablet Take 1 tablet (10 mg total) by mouth 2 (two) times daily as needed for muscle spasms. 20 tablet 0   everolimus (AFINITOR) 10 MG tablet Take 1 tablet (10 mg total) by mouth daily. 28 tablet 6   gemfibrozil (LOPID) 600 MG tablet TAKE 1 TABLET (600 MG TOTAL) BY MOUTH 2 (TWO) TIMES DAILY BEFORE A MEAL. 180 tablet 1   letrozole (FEMARA) 2.5 MG tablet Take 1 tablet (2.5 mg total) by mouth daily. 90 tablet 3   loperamide (IMODIUM) 2 MG capsule Take 2 mg by mouth 4 (four) times daily as needed for diarrhea or loose stools. Reported on 11/30/2015     metFORMIN (GLUCOPHAGE-XR) 500 MG 24 hr tablet Take 1 tablet (500 mg total) by mouth 3 (three) times daily before meals.  3   traMADol (ULTRAM) 50 MG tablet Take 0.5-1 tablets (25-50 mg total) by mouth every 6 (six) hours as needed. 30 tablet 0   No current facility-administered medications for this visit.     OBJECTIVE:  Vitals:   06/14/19 0939  BP: 111/63  Pulse: 93  Resp: 18  Temp: 98.7 F (37.1 C)  SpO2: 98%     Body mass index is 21.87 kg/m.   Filed Weights   06/14/19 0939  Weight: 129 lb 6.4 oz (58.7 kg)   ECOG FS:1 - Symptomatic but completely ambulatory GENERAL: Patient is a well appearing female in no acute distress HEENT:  Sclerae anicteric.  Oropharynx clear and moist. No ulcerations or evidence of oropharyngeal candidiasis. Neck is supple.  NODES:  No cervical, supraclavicular, or axillary lymphadenopathy palpated.  BREAST EXAM:  deferred LUNGS:  Clear to auscultation bilaterally.  No wheezes or rhonchi. HEART:  Regular rate and rhythm. No murmur appreciated. ABDOMEN:  Soft, nontender.   Positive, normoactive bowel sounds. No organomegaly palpated. MSK:  No focal spinal tenderness to palpation. Full range of motion bilaterally in the upper extremities. EXTREMITIES:  No peripheral edema.  SKIN:  Clear with no obvious rashes or skin changes. No nail dyscrasia. NEURO:  Nonfocal. Well oriented.  Appropriate affect.     LAB  RESULTS:  CMP     Component Value Date/Time   NA 140 05/17/2019 0814   NA 139 07/08/2017 0942   K 4.5 05/17/2019 0814   K 3.8 07/08/2017 0942   CL 109 05/17/2019 0814   CL 109 (H) 12/31/2012 0940   CO2 18 (L) 05/17/2019 0814   CO2 18 (L) 07/08/2017 0942   GLUCOSE 104 (H) 05/17/2019 0814   GLUCOSE 156 (H) 07/08/2017 0942   GLUCOSE 127 (H) 12/31/2012 0940   BUN 30 (H) 05/17/2019 0814   BUN 17.7 07/08/2017 0942   CREATININE 1.09 (H) 05/17/2019 0814   CREATININE 1.18 (H) 12/25/2017 0959   CREATININE 1.0 07/08/2017 0942   CALCIUM 10.3 05/17/2019 0814   CALCIUM 10.4 07/08/2017 0942   PROT 8.3 (H) 05/17/2019 0814   PROT 7.7 07/08/2017 0942   ALBUMIN 3.9 05/17/2019 0814   ALBUMIN 3.4 (L) 07/08/2017 0942   AST 21 05/17/2019 0814   AST 50 (H) 12/25/2017 0959   AST 54 (H) 07/08/2017 0942   ALT 11 05/17/2019 0814   ALT 53 12/25/2017 0959   ALT 39 07/08/2017 0942   ALKPHOS 133 (H) 05/17/2019 0814   ALKPHOS 232 (H) 07/08/2017 0942   BILITOT 0.2 (L) 05/17/2019 0814   BILITOT 0.3 12/25/2017 0959   BILITOT 0.42 07/08/2017 0942   GFRNONAA 52 (L) 05/17/2019 0814   GFRNONAA 47 (L) 12/25/2017 0959   GFRAA >60 05/17/2019 0814   GFRAA 54 (L) 12/25/2017 0959    I No results found for: SPEP  Lab Results  Component Value Date   WBC 7.4 06/14/2019   NEUTROABS 5.3 06/14/2019   HGB 15.1 (H) 06/14/2019   HCT 46.5 (H) 06/14/2019   MCV 81.3 06/14/2019   PLT 324 06/14/2019      Chemistry      Component Value Date/Time   NA 140 05/17/2019 0814   NA 139 07/08/2017 0942   K 4.5 05/17/2019 0814   K 3.8 07/08/2017 0942   CL 109 05/17/2019 0814    CL 109 (H) 12/31/2012 0940   CO2 18 (L) 05/17/2019 0814   CO2 18 (L) 07/08/2017 0942   BUN 30 (H) 05/17/2019 0814   BUN 17.7 07/08/2017 0942   CREATININE 1.09 (H) 05/17/2019 0814   CREATININE 1.18 (H) 12/25/2017 0959   CREATININE 1.0 07/08/2017 0942      Component Value Date/Time   CALCIUM 10.3 05/17/2019 0814   CALCIUM 10.4 07/08/2017 0942   ALKPHOS 133 (H) 05/17/2019 0814   ALKPHOS 232 (H) 07/08/2017 0942   AST 21 05/17/2019 0814   AST 50 (H) 12/25/2017 0959   AST 54 (H) 07/08/2017 0942   ALT 11 05/17/2019 0814   ALT 53 12/25/2017 0959   ALT 39 07/08/2017 0942   BILITOT 0.2 (L) 05/17/2019 0814   BILITOT 0.3 12/25/2017 0959   BILITOT 0.42 07/08/2017 0942       Lab Results  Component Value Date   LABCA2 58 (H) 09/14/2012    No components found for: [id number]  No results for input(s): INR in the last 168 hours.  Urinalysis    Component Value Date/Time   COLORURINE YELLOW 11/25/2017 0901   APPEARANCEUR HAZY (A) 11/25/2017 0901   LABSPEC 1.017 11/25/2017 0901   LABSPEC 1.030 07/13/2015 0841   PHURINE 5.0 11/25/2017 0901   GLUCOSEU NEGATIVE 11/25/2017 0901  GLUCOSEU Negative 07/13/2015 0841   HGBUR MODERATE (A) 11/25/2017 0901   BILIRUBINUR NEGATIVE 11/25/2017 0901   BILIRUBINUR Negative 07/13/2015 0841   KETONESUR NEGATIVE 11/25/2017 0901   PROTEINUR 100 (A) 11/25/2017 0901   UROBILINOGEN 0.2 07/13/2015 0841   NITRITE NEGATIVE 11/25/2017 0901   LEUKOCYTESUR NEGATIVE 11/25/2017 0901   LEUKOCYTESUR Negative 07/13/2015 0841    STUDIES: Dg Hip Unilat With Pelvis 2-3 Views Left  Result Date: 05/17/2019 CLINICAL DATA:  Status post left hip replacement. Evaluate for loosening. EXAM: DG HIP (WITH OR WITHOUT PELVIS) 2-3V LEFT COMPARISON:  None. FINDINGS: The patient is status post left hip replacement. Acetabular and femoral components are in good position. No evidence of loosening or failure identified. No dislocation or fracture. IMPRESSION: No evidence of  loosening. The left hip replacement is normal in appearance. Electronically Signed   By: Dorise Bullion III M.D   On: 05/17/2019 10:49    ASSESSMENT: 68 y.o. Barbara Parks, Barbara Parks woman with stage IV breast cancer, on BOLERO-4 trial  (1) status post right mastectomy and sentinel lymph node sampling 03/22/2004 for a right upper-outer quadrant pT2 pN1, stage IIB invasive ductal carcinoma with lobular features, grade 2, estrogen receptor and progesterone receptor positive, HER-2 negative, with an MIB-1 of 9% ([id number] and [id number])  (2) addition all right axillary lymph node sampling 05/07/2004 showed 2 benign lymph nodes (4 lymph nodes previously removed, so total was one positive lymph node out of 6; [id number])  (3) the patient was evaluated by radiation oncology; no postmastectomy radiation recommended  (4) adjuvant chemotherapy with dose dense doxorubicin and cyclophosphamide x4 cycles (first cycle delayed one week) followed by dose dense paclitaxel x4 was completed 09/18/2004  (5) tamoxifen started March 2006, discontinued 2009  METASTATIC DISEASE: February 2013 (6) presenting with a large pericardial effusion, large right pleural effusion and possible right middle lobe bronchial obstruction February 2013, status post pericardial window placement, fiberoptic bronchoscopy and right Pleurx placement 09/11/2011, with biopsy of the bronchus intermedius and pericardium positive for metastatic breast cancer, estrogen receptor 91% positive with moderate staining intensity, progesterone receptor 100% positive with strong staining intensity, with an MIB-1 of 35%, and no HER-2 amplification, the signals ratio being 1.37 ([id number])  (7) enrolled in BOLERO-4 trial 10/10/2011, receiving letrozole and everolimus  (a) two Parks areas of enhancement in the cerebellum noted by brain MRI 10/03/2011 were no longer apparent on repeat MRI 08/21/2012-- most recent brain MRI 04/01/2014 showed no evidence of intracranial  metastatic disease  (b) sclerotic lesions in left iliac bone and sacrum have not been biopsied; stable; plan is to start zolendronate after patient updates her dental care (extraction planned)  (c) RLL lung nodule, stable (rescanned 07/12/2014 and 08/11/2014) R hilar and subcarinal lymph nodes: stable  (d) most recent CT of the chest:03/25/2018, stable  (e) CT Angie 12/12/2018 stable  (8) additional problems:  (a) hepatic steatosis  (b) COPD/ emphysema/ asthma  (c) advanced L hip osteoarthritis, status post left total hip replacement 06/07/2015  (d) aortoiliac atherosclerosis  (e) dental evaluation pending w possible dental extractions  (f) likely thalassemia trait  (g) hyperlipidemia  (9) Bone density concerns: DEXA scan 08/11/2013 was normal  (a) repeat DEXA scan 04/09/2018 shows a T score of -1.8, osteopenia  (10) progression of bony disease:  (a) PET scan 12/22/2018 shows no hypermetabolic soft tissue metastases but multifocal bony metastasis  (b) denosumab/Xgeva started on 02/25/2019, repeated every 28 days (delayed due to dental extraction healing issues)   PLAN: Abigaelle is doing well  today.  She continues on Letrozole and Everolimus for her metastatic breast cancer, along with Xgeva.  She tolerates these well.  She will continue this.  We are following her CA27-29 for her breast cancer and it is continuing to rise slightly.  Her most recent scans were last month.  She will be rescanned in January, and I placed orders for this.  She has no increase in symptoms such as Parks, or shortness of breath.    Geraldene is overall doing quite well.  I recommended she continue with her activity level, and on her current regimen.  She will return in 4 weeks for lab and injection and in 8 weeks for labs, f/u with Dr. Jana Hakim, and her injection.    She was recommended to continue with the appropriate pandemic precautions. She knows to call for any questions that may arise between now and her next  appointment.  We are happy to see her sooner if needed.  A total of (20) minutes of face-to-face time was spent with this patient with greater than 50% of that time in counseling and care-coordination.    Wilber Bihari, NP Medical Oncology and Hematology [facility] [address] [place], [place] [address] Tel. [phone]    Fax. [phone]

## 2019-06-15 LAB — CANCER ANTIGEN 27.29: CA 27.29: 429.1 U/mL — ABNORMAL HIGH (ref 0.0–38.6)

## 2019-06-16 ENCOUNTER — Other Ambulatory Visit: Payer: Self-pay | Admitting: Oncology

## 2019-06-16 NOTE — Progress Notes (Signed)
I called Barbara Parks and gave her the option of getting her scans scheduled for December instead of January.  She will let us know what she would like to do regarding that.

## 2019-06-22 ENCOUNTER — Other Ambulatory Visit: Payer: Self-pay | Admitting: Oncology

## 2019-06-22 DIAGNOSIS — C7951 Secondary malignant neoplasm of bone: Secondary | ICD-10-CM

## 2019-06-22 DIAGNOSIS — C7800 Secondary malignant neoplasm of unspecified lung: Secondary | ICD-10-CM

## 2019-06-22 DIAGNOSIS — C50911 Malignant neoplasm of unspecified site of right female breast: Secondary | ICD-10-CM

## 2019-07-09 ENCOUNTER — Other Ambulatory Visit: Payer: Self-pay

## 2019-07-09 ENCOUNTER — Other Ambulatory Visit: Payer: Self-pay | Admitting: Adult Health

## 2019-07-09 ENCOUNTER — Ambulatory Visit (HOSPITAL_COMMUNITY)
Admission: RE | Admit: 2019-07-09 | Discharge: 2019-07-09 | Disposition: A | Discharge status: Home / Self Care | Payer: Medicare Other | Source: Ambulatory Visit | Attending: Adult Health | Admitting: Adult Health

## 2019-07-09 ENCOUNTER — Encounter (HOSPITAL_COMMUNITY)
Admission: RE | Admit: 2019-07-09 | Discharge: 2019-07-09 | Disposition: A | Discharge status: Home / Self Care | Payer: Medicare Other | Source: Ambulatory Visit | Attending: Adult Health | Admitting: Adult Health

## 2019-07-09 DIAGNOSIS — C7800 Secondary malignant neoplasm of unspecified lung: Secondary | ICD-10-CM | POA: Diagnosis not present

## 2019-07-09 DIAGNOSIS — C7951 Secondary malignant neoplasm of bone: Secondary | ICD-10-CM | POA: Insufficient documentation

## 2019-07-09 DIAGNOSIS — C787 Secondary malignant neoplasm of liver and intrahepatic bile duct: Secondary | ICD-10-CM

## 2019-07-09 DIAGNOSIS — C50911 Malignant neoplasm of unspecified site of right female breast: Secondary | ICD-10-CM | POA: Insufficient documentation

## 2019-07-09 MED ORDER — IOHEXOL 300 MG/ML  SOLN
75.0000 mL | Freq: Once | INTRAMUSCULAR | Status: AC | PRN
  Administered 2019-07-09: 75 mL via INTRAVENOUS

## 2019-07-09 MED ORDER — TECHNETIUM TC 99M MEDRONATE IV KIT
21.2000 | PACK | Freq: Once | INTRAVENOUS | Status: AC | PRN
  Administered 2019-07-09: 21.2 via INTRAVENOUS

## 2019-07-09 MED ORDER — SODIUM CHLORIDE (PF) 0.9 % IJ SOLN
INTRAMUSCULAR | Status: AC
  Filled 2019-07-09: qty 50

## 2019-07-09 NOTE — Progress Notes (Signed)
I called Barbara Parks and let her know that her bone scan and CT scan show stable osseous metastases, stable pulmonary metastases, however note an indeterminate lesion on the liver.  I have placed an MRI liver to further evaluate.  Patient verbalized understanding and thanked me for call.  Wilber Bihari, NP

## 2019-07-12 ENCOUNTER — Inpatient Hospital Stay: Payer: Medicare Other | Attending: Oncology

## 2019-07-12 ENCOUNTER — Ambulatory Visit: Payer: Medicare Other | Admitting: Adult Health

## 2019-07-12 ENCOUNTER — Other Ambulatory Visit: Payer: Self-pay

## 2019-07-12 ENCOUNTER — Inpatient Hospital Stay (HOSPITAL_BASED_OUTPATIENT_CLINIC_OR_DEPARTMENT_OTHER): Payer: Medicare Other | Admitting: Oncology

## 2019-07-12 ENCOUNTER — Inpatient Hospital Stay: Payer: Medicare Other

## 2019-07-12 VITALS — BP 116/62 | HR 87 | Temp 98.3°F | Resp 16 | Ht 64.5 in | Wt 131.5 lb

## 2019-07-12 DIAGNOSIS — K769 Liver disease, unspecified: Secondary | ICD-10-CM | POA: Diagnosis not present

## 2019-07-12 DIAGNOSIS — M545 Low back pain: Secondary | ICD-10-CM | POA: Diagnosis not present

## 2019-07-12 DIAGNOSIS — I3131 Malignant pericardial effusion in diseases classified elsewhere: Secondary | ICD-10-CM

## 2019-07-12 DIAGNOSIS — C7801 Secondary malignant neoplasm of right lung: Secondary | ICD-10-CM | POA: Diagnosis not present

## 2019-07-12 DIAGNOSIS — C50411 Malignant neoplasm of upper-outer quadrant of right female breast: Secondary | ICD-10-CM | POA: Diagnosis not present

## 2019-07-12 DIAGNOSIS — C50911 Malignant neoplasm of unspecified site of right female breast: Secondary | ICD-10-CM

## 2019-07-12 DIAGNOSIS — Z170 Estrogen receptor positive status [ER+]: Secondary | ICD-10-CM

## 2019-07-12 DIAGNOSIS — M8580 Other specified disorders of bone density and structure, unspecified site: Secondary | ICD-10-CM | POA: Diagnosis not present

## 2019-07-12 DIAGNOSIS — C801 Malignant (primary) neoplasm, unspecified: Secondary | ICD-10-CM

## 2019-07-12 DIAGNOSIS — Z9221 Personal history of antineoplastic chemotherapy: Secondary | ICD-10-CM | POA: Insufficient documentation

## 2019-07-12 DIAGNOSIS — C7951 Secondary malignant neoplasm of bone: Secondary | ICD-10-CM

## 2019-07-12 DIAGNOSIS — R978 Other abnormal tumor markers: Secondary | ICD-10-CM | POA: Insufficient documentation

## 2019-07-12 DIAGNOSIS — Z87891 Personal history of nicotine dependence: Secondary | ICD-10-CM | POA: Insufficient documentation

## 2019-07-12 DIAGNOSIS — C7989 Secondary malignant neoplasm of other specified sites: Secondary | ICD-10-CM | POA: Diagnosis not present

## 2019-07-12 DIAGNOSIS — Z9011 Acquired absence of right breast and nipple: Secondary | ICD-10-CM | POA: Insufficient documentation

## 2019-07-12 DIAGNOSIS — Z79811 Long term (current) use of aromatase inhibitors: Secondary | ICD-10-CM | POA: Insufficient documentation

## 2019-07-12 DIAGNOSIS — C7800 Secondary malignant neoplasm of unspecified lung: Secondary | ICD-10-CM

## 2019-07-12 DIAGNOSIS — I313 Pericardial effusion (noninflammatory): Secondary | ICD-10-CM | POA: Diagnosis not present

## 2019-07-12 LAB — CBC WITH DIFFERENTIAL/PLATELET
Abs Immature Granulocytes: 0.02 10*3/uL (ref 0.00–0.07)
Basophils Absolute: 0.1 10*3/uL (ref 0.0–0.1)
Basophils Relative: 1 %
Eosinophils Absolute: 0.1 10*3/uL (ref 0.0–0.5)
Eosinophils Relative: 1 %
HCT: 44.6 % (ref 36.0–46.0)
Hemoglobin: 14.3 g/dL (ref 12.0–15.0)
Immature Granulocytes: 0 %
Lymphocytes Relative: 18 %
Lymphs Abs: 1.2 10*3/uL (ref 0.7–4.0)
MCH: 26.5 pg (ref 26.0–34.0)
MCHC: 32.1 g/dL (ref 30.0–36.0)
MCV: 82.7 fL (ref 80.0–100.0)
Monocytes Absolute: 0.8 10*3/uL (ref 0.1–1.0)
Monocytes Relative: 13 %
Neutro Abs: 4.3 10*3/uL (ref 1.7–7.7)
Neutrophils Relative %: 67 %
Platelets: 294 10*3/uL (ref 150–400)
RBC: 5.39 MIL/uL — ABNORMAL HIGH (ref 3.87–5.11)
RDW: 14.5 % (ref 11.5–15.5)
WBC: 6.4 10*3/uL (ref 4.0–10.5)
nRBC: 0 % (ref 0.0–0.2)

## 2019-07-12 LAB — COMPREHENSIVE METABOLIC PANEL
ALT: 10 U/L (ref 0–44)
AST: 20 U/L (ref 15–41)
Albumin: 3.8 g/dL (ref 3.5–5.0)
Alkaline Phosphatase: 141 U/L — ABNORMAL HIGH (ref 38–126)
Anion gap: 10 (ref 5–15)
BUN: 29 mg/dL — ABNORMAL HIGH (ref 8–23)
CO2: 18 mmol/L — ABNORMAL LOW (ref 22–32)
Calcium: 10.1 mg/dL (ref 8.9–10.3)
Chloride: 110 mmol/L (ref 98–111)
Creatinine, Ser: 0.97 mg/dL (ref 0.44–1.00)
GFR calc Af Amer: >60 mL/min (ref >60)
GFR calc non Af Amer: >60 mL/min (ref >60)
Glucose, Bld: 93 mg/dL (ref 70–99)
Potassium: 4.5 mmol/L (ref 3.5–5.1)
Sodium: 138 mmol/L (ref 135–145)
Total Bilirubin: 0.3 mg/dL (ref 0.3–1.2)
Total Protein: 8 g/dL (ref 6.5–8.1)

## 2019-07-12 MED ORDER — DENOSUMAB 120 MG/1.7ML ~~LOC~~ SOLN: 120.0000 mg | Freq: Once | SUBCUTANEOUS | Status: DC

## 2019-07-12 MED ORDER — DENOSUMAB 120 MG/1.7ML ~~LOC~~ SOLN
SUBCUTANEOUS | Status: AC
  Filled 2019-07-12: qty 1.7

## 2019-07-12 MED ORDER — EVEROLIMUS 10 MG PO TABS: 10.0000 mg | ORAL_TABLET | Freq: Every day | ORAL | 6 refills | Status: DC

## 2019-07-12 MED ORDER — LETROZOLE 2.5 MG PO TABS: 2.5000 mg | ORAL_TABLET | Freq: Every day | ORAL | 3 refills | Status: DC

## 2019-07-12 NOTE — Patient Instructions (Signed)
Denosumab injection What is this medicine? DENOSUMAB (den oh sue mab) slows bone breakdown. Prolia is used to treat osteoporosis in women after menopause and in men, and in people who are taking corticosteroids for 6 months or more. Xgeva is used to treat a high calcium level due to cancer and to prevent bone fractures and other bone problems caused by multiple myeloma or cancer bone metastases. Xgeva is also used to treat giant cell tumor of the bone. This medicine may be used for other purposes; ask your health care provider or pharmacist if you have questions. COMMON BRAND NAME(S): Prolia, XGEVA What should I tell my health care provider before I take this medicine? They need to know if you have any of these conditions:  dental disease  having surgery or tooth extraction  infection  kidney disease  low levels of calcium or Vitamin D in the blood  malnutrition  on hemodialysis  skin conditions or sensitivity  thyroid or parathyroid disease  an unusual reaction to denosumab, other medicines, foods, dyes, or preservatives  pregnant or trying to get pregnant  breast-feeding How should I use this medicine? This medicine is for injection under the skin. It is given by a health care professional in a hospital or clinic setting. A special MedGuide will be given to you before each treatment. Be sure to read this information carefully each time. For Prolia, talk to your pediatrician regarding the use of this medicine in children. Special care may be needed. For Xgeva, talk to your pediatrician regarding the use of this medicine in children. While this drug may be prescribed for children as young as 13 years for selected conditions, precautions do apply. Overdosage: If you think you have taken too much of this medicine contact a poison control center or emergency room at once. NOTE: This medicine is only for you. Do not share this medicine with others. What if I miss a dose? It is  important not to miss your dose. Call your doctor or health care professional if you are unable to keep an appointment. What may interact with this medicine? Do not take this medicine with any of the following medications:  other medicines containing denosumab This medicine may also interact with the following medications:  medicines that lower your chance of fighting infection  steroid medicines like prednisone or cortisone This list may not describe all possible interactions. Give your health care provider a list of all the medicines, herbs, non-prescription drugs, or dietary supplements you use. Also tell them if you smoke, drink alcohol, or use illegal drugs. Some items may interact with your medicine. What should I watch for while using this medicine? Visit your doctor or health care professional for regular checks on your progress. Your doctor or health care professional may order blood tests and other tests to see how you are doing. Call your doctor or health care professional for advice if you get a fever, chills or sore throat, or other symptoms of a cold or flu. Do not treat yourself. This drug may decrease your body's ability to fight infection. Try to avoid being around people who are sick. You should make sure you get enough calcium and vitamin D while you are taking this medicine, unless your doctor tells you not to. Discuss the foods you eat and the vitamins you take with your health care professional. See your dentist regularly. Brush and floss your teeth as directed. Before you have any dental work done, tell your dentist you are   receiving this medicine. Do not become pregnant while taking this medicine or for 5 months after stopping it. Talk with your doctor or health care professional about your birth control options while taking this medicine. Women should inform their doctor if they wish to become pregnant or think they might be pregnant. There is a potential for serious side  effects to an unborn child. Talk to your health care professional or pharmacist for more information. What side effects may I notice from receiving this medicine? Side effects that you should report to your doctor or health care professional as soon as possible:  allergic reactions like skin rash, itching or hives, swelling of the face, lips, or tongue  bone pain  breathing problems  dizziness  jaw pain, especially after dental work  redness, blistering, peeling of the skin  signs and symptoms of infection like fever or chills; cough; sore throat; pain or trouble passing urine  signs of low calcium like fast heartbeat, muscle cramps or muscle pain; pain, tingling, numbness in the hands or feet; seizures  unusual bleeding or bruising  unusually weak or tired Side effects that usually do not require medical attention (report to your doctor or health care professional if they continue or are bothersome):  constipation  diarrhea  headache  joint pain  loss of appetite  muscle pain  runny nose  tiredness  upset stomach This list may not describe all possible side effects. Call your doctor for medical advice about side effects. You may report side effects to FDA at 1-800-FDA-1088. Where should I keep my medicine? This medicine is only given in a clinic, doctor's office, or other health care setting and will not be stored at home. NOTE: This sheet is a summary. It may not cover all possible information. If you have questions about this medicine, talk to your doctor, pharmacist, or health care provider.  2020 Elsevier/Gold Standard (2017-11-21 16:10:44)

## 2019-07-12 NOTE — Progress Notes (Signed)
[place]  [phone] [phone]) [phone]    ID: Barbara Parks DOB: [birth year]  MR#: [id number]  ZSW#:[id number]  Patient Care Team: [name], MD as PCP - General (Family Medicine) Jerline Pain, MD (Cardiology) Gaye Pollack, MD (Cardiothoracic Surgery) Denorris Reust, Virgie Dad, MD as Consulting Physician (Oncology) OTHER MD: Dr Erroll Luna- Merlene Laughter, DDS; Gaynelle Arabian MD   CHIEF COMPLAINT: metastatic breast cancer, formerly on BOLERO study  CURRENT TREATMENT: letrozole + everolimus; denosumab/Xgeva   INTERVAL HISTORY: [name] returns today for follow-up and treatment of her metastatic estrogen receptor positive breast cancer.  She continues on letrozole.  This has never been a problem for her and she continues to have no issues regarding hot flashes, vaginal dryness, or arthralgias/myalgias.  She also continues on everolimus.  She denies any mouth sores, cough, shortness of breath, pleurisy, or unusual fatigue. Jonathan receives [place] every 4 weeks.    Since her last visit, she underwent restaging scans on 07/09/2019. Chest CT showed: stable right lung pulmonary nodules; stable subtle skeletal metastasis including right scapula and posterior right 10th rib; indeterminate, subtle low-density lesion in left hepatic lobe.  Order for abdominal MRI is in place to further evaluate the liver lesion.  We are following her CA 27-29, which has been rising. Results for Barbara Parks (MRN [id number]) as of [birth year] 08:17  Ref. Range 11/19/2018 11:25 01/04/2019 08:28 02/25/2019 08:07 04/22/2019 08:07 06/14/2019 09:24  CA 27.29 Latest Ref Range: 0.0 - 38.6 U/mL 175.1 (H) 224.5 (H) 264.8 (H) 310.1 (H) 429.1 (H)    REVIEW OF SYSTEMS: Barbara Parks tells me her sugar is now under better control and she has gained a little bit of weight.  Generally she feels better.  She plays golf when she can and yesterday which was a very clear day as she worked in the yard all day  so she feels a little stiff today.  She has had no nausea, no altered taste, no abdominal discomfort.  A detailed review of systems today was otherwise stable.    BREAST CANCER HISTORY: From Dr. Collier [name]'s original intake note 04/13/2004:  "This woman has been in good health all of her life. She recently moved from [place] to work here.  She palpated a mass at about the 12 o'clock position in mid-July. She has not noticed any nipple retraction or skin changes.  She was seen by her primary care doctor who subsequently referred her for a mammogram.  Mammogram was performed on 03/01/04. This demonstrated a spiculated 2 cm mass at the 12 o'clock position in the right breast.  Upper outer quadrant of the left breast shows some distortion.   Physical exam at that time showed a firm, nontender nodule at the 12 o'clock position in the right breast, 5 cm from the nipple.  Ultrasound of this area showed a hypoechoic ill-defined mass, measuring 2.2 x 1.3 x 1.4 cm.  Physical exam of the left breast showed general vague thickening, upper outer quadrant of the left breast with a discrete palpable mass. The ultrasound performed showed a single hypoechoic ill-defined nodule at the 1 o'clock position, measuring 7 x 5 x 6 mm.    She had biopsies of both lesions on 03/02/03.  Needle core biopsy of the lesion on the right breast revealed invasive mammary carcinoma.  Needle core biopsy of the left breast showed a complex fibroadenoma.  Prognostic panel of the lesion on the right breast showed it to be ER positive at 73%, PR positive  at 90% and proliferative index 9%, HER-2 was 1+.  Patient was referred to Dr. Annamaria Boots, who performed a simple mastectomy with sentinel lymph node evaluation on 03/22/04.  Final pathology showed this to be an invasive ductal carcinoma  with lobular features, measuring 2.2 cm, grade 2 of 3.  Margins negative for carcinoma.  Invasive ductal carcinoma was extended to involve deep dermis of the nipple.   Lymphovascular invasion was identified.  Total of 4 sentinel lymph nodes were evaluated.  Touch imprints at the time of the OR was felt to be negative.  Subsequent evaluation showed a 5 mm focus of metastatic carcinoma in one of the four lymph nodes on microscopic after sectioning.  There was extracapsular extension  of one lymph node as well. The remaining three lymph nodes were all negative."  The patient's subsequent history is detailed above.   PAST MEDICAL HISTORY: Past Medical History:  Diagnosis Date  . Arthritis    left hip  . Asthma   . breast ca 2005   breast/chemo R mastectomy  . Dermatitis   . Diabetes mellitus without complication ([place])   . GERD (gastroesophageal reflux disease)   . Hypercholesteremia   . Metastasis to lung ([place]) dx'd 08/2011  . Osteopenia due to cancer therapy 09/09/2013  . Palpitations   . Personal history of chemotherapy   . Shortness of breath     PAST SURGICAL HISTORY: Past Surgical History:  Procedure Laterality Date  . BREAST SURGERY  2005   right  . CATARACT EXTRACTION W/PHACO Left 01/18/2014   Procedure: CATARACT EXTRACTION PHACO AND INTRAOCULAR LENS PLACEMENT (IOC);  Surgeon: Elta Guadeloupe T. Gershon Crane, MD;  Location: [facility];  Service: Ophthalmology;  Laterality: Left;  CDE 15.79  . CATARACT EXTRACTION W/PHACO Right 02/08/2014   Procedure: CATARACT EXTRACTION PHACO AND INTRAOCULAR LENS PLACEMENT (IOC);  Surgeon: Elta Guadeloupe T. Gershon Crane, MD;  Location: [facility];  Service: Ophthalmology;  Laterality: Right;  CDE 4.41  . CHEST TUBE INSERTION  09/11/2011   Procedure: INSERTION PLEURAL DRAINAGE CATHETER;  Surgeon: Gaye Pollack, MD;  Location: [facility];  Service: Thoracic;  Laterality: Right;  . MASTECTOMY Right   . PERICARDIAL WINDOW  09/11/2011   Procedure: PERICARDIAL WINDOW;  Surgeon: Gaye Pollack, MD;  Location: [facility];  Service: Thoracic;  Laterality: N/A;  . REMOVAL OF PLEURAL DRAINAGE CATHETER  12/19/2011   Procedure: REMOVAL OF PLEURAL DRAINAGE CATHETER;   Surgeon: Gaye Pollack, MD;  Location: [facility];  Service: Thoracic;  Laterality: Right;  TO BE DONE IN MINOR ROOM, SHORT STAY  . [place]  . TOTAL HIP ARTHROPLASTY Left 06/07/2015   Procedure: LEFT TOTAL HIP ARTHROPLASTY ANTERIOR APPROACH;  Surgeon: Gaynelle Arabian, MD;  Location: [facility];  Service: Orthopedics;  Laterality: Left;  Marland Kitchen VIDEO BRONCHOSCOPY  09/11/2011   Procedure: VIDEO BRONCHOSCOPY;  Surgeon: Gaye Pollack, MD;  Location: [facility] OR;  Service: Thoracic;  Laterality: N/A;    FAMILY HISTORY Family History  Problem Relation Age of Onset  . Cancer Mother        breast  . Heart disease Father   . Diabetes Other   . Anesthesia problems Neg Hx    The patient's mother was diagnosed with breast cancer at age 44, she died age 51 from congestive heart failure.The patient's father died from heart disease at age 20.  She has one sister alive & well.  Two brothers alive & well, one with diabetes. The patient's sister was also diagnosed with breast cancer,  and was tested for the BRCA gene, and was negative. The patient herself has not been tested. There is no history of ovarian cancer in the family   GYNECOLOGIC HISTORY:  No LMP recorded. Patient is postmenopausal. Menarche age 67, the patient is GX P0. She stopped having periods with chemotherapy in 2005. She never took hormone replacement   SOCIAL HISTORY:  [place] used to work as a Secondary school teacher, and she was also in [employer] for 5 years. She was a Archivist. She is single, lives alone with her Shitzu-poodle Sammie.. Family is all in the [place] area.     ADVANCED DIRECTIVES: Not in place. At the 02/23/2014 visit the patient was given the appropriate documents to complete and notarize at her discretion. She tells me she is planning to name her sister, Billie Ruddy, as healthcare power of attorney. [name] can be reached at [phone]   HEALTH MAINTENANCE: Social History   Tobacco Use  . Smoking status:  Former Smoker    Packs/day: 1.50    Years: 30.00    Pack years: 45.00    Types: Cigarettes    Quit date: 09/10/2007    Years since quitting: 11.8  . Smokeless tobacco: Never Used  Substance Use Topics  . Alcohol use: No  . Drug use: No    Colonoscopy:  PAP:  Bone density: 04/09/2018, T score -1.8  Lipid panel:  Allergies  Allergen Reactions  . Aspirin     REACTION: upset stomach  . Latex Other (See Comments)    Blistering and skin peels off  . Nsaids Nausea And Vomiting    Extreme nausea and vomiting    Current Outpatient Medications  Medication Sig Dispense Refill  . cholecalciferol (VITAMIN D3) 25 MCG (1000 UT) tablet Take 2 tablets (2,000 Units total) by mouth daily. 180 tablet 4  . cyclobenzaprine (FLEXERIL) 10 MG tablet Take 1 tablet (10 mg total) by mouth 2 (two) times daily as needed for muscle spasms. 20 tablet 0  . everolimus (AFINITOR) 10 MG tablet Take 1 tablet (10 mg total) by mouth daily. 28 tablet 6  . gemfibrozil (LOPID) 600 MG tablet TAKE 1 TABLET (600 MG TOTAL) BY MOUTH 2 (TWO) TIMES DAILY BEFORE A MEAL. 180 tablet 1  . letrozole (FEMARA) 2.5 MG tablet Take 1 tablet (2.5 mg total) by mouth daily. 90 tablet 3  . loperamide (IMODIUM) 2 MG capsule Take 2 mg by mouth 4 (four) times daily as needed for diarrhea or loose stools. Reported on 11/30/2015    . metFORMIN (GLUCOPHAGE-XR) 500 MG 24 hr tablet Take 1 tablet (500 mg total) by mouth 3 (three) times daily before meals.  3  . traMADol (ULTRAM) 50 MG tablet Take 0.5-1 tablets (25-50 mg total) by mouth every 6 (six) hours as needed. 30 tablet 0   No current facility-administered medications for this visit.    OBJECTIVE: Middle-aged white woman in no acute distress Vitals:   07/12/19 1116  BP: 116/62  Pulse: 87  Resp: 16  Temp: 98.3 F (36.8 C)  SpO2: 100%     Body mass index is 22.22 kg/m.   Filed Weights   07/12/19 1116  Weight: 131 lb 8 oz (59.6 kg)   ECOG FS:1 - Symptomatic but completely  ambulatory  Sclerae unicteric, EOMs intact Wearing a mask No cervical or supraclavicular adenopathy Lungs no rales or rhonchi Heart regular rate and rhythm Abd soft, nontender, positive bowel sounds MSK no focal spinal tenderness, no upper extremity lymphedema  Neuro: nonfocal, well oriented, appropriate affect Breasts: Deferred   LAB  RESULTS:  CMP     Component Value Date/Time   NA 138 07/12/2019 1057   NA 139 07/08/2017 0942   K 4.5 07/12/2019 1057   K 3.8 07/08/2017 0942   CL 110 07/12/2019 1057   CL 109 (H) 12/31/2012 0940   CO2 18 (L) 07/12/2019 1057   CO2 18 (L) 07/08/2017 0942   GLUCOSE 93 07/12/2019 1057   GLUCOSE 156 (H) 07/08/2017 0942   GLUCOSE 127 (H) 12/31/2012 0940   BUN 29 (H) 07/12/2019 1057   BUN 17.7 07/08/2017 0942   CREATININE 0.97 07/12/2019 1057   CREATININE 1.18 (H) 12/25/2017 0959   CREATININE 1.0 07/08/2017 0942   CALCIUM 10.1 07/12/2019 1057   CALCIUM 10.4 07/08/2017 0942   PROT 8.0 07/12/2019 1057   PROT 7.7 07/08/2017 0942   ALBUMIN 3.8 07/12/2019 1057   ALBUMIN 3.4 (L) 07/08/2017 0942   AST 20 07/12/2019 1057   AST 50 (H) 12/25/2017 0959   AST 54 (H) 07/08/2017 0942   ALT 10 07/12/2019 1057   ALT 53 12/25/2017 0959   ALT 39 07/08/2017 0942   ALKPHOS 141 (H) 07/12/2019 1057   ALKPHOS 232 (H) 07/08/2017 0942   BILITOT 0.3 07/12/2019 1057   BILITOT 0.3 12/25/2017 0959   BILITOT 0.42 07/08/2017 0942   GFRNONAA >60 07/12/2019 1057   GFRNONAA 47 (L) 12/25/2017 0959   GFRAA >60 07/12/2019 1057   GFRAA 54 (L) 12/25/2017 0959    I No results found for: SPEP  Lab Results  Component Value Date   WBC 6.4 07/12/2019   NEUTROABS 4.3 07/12/2019   HGB 14.3 07/12/2019   HCT 44.6 07/12/2019   MCV 82.7 07/12/2019   PLT 294 07/12/2019      Chemistry      Component Value Date/Time   NA 138 07/12/2019 1057   NA 139 07/08/2017 0942   K 4.5 07/12/2019 1057   K 3.8 07/08/2017 0942   CL 110 07/12/2019 1057   CL 109 (H) 12/31/2012 0940     CO2 18 (L) 07/12/2019 1057   CO2 18 (L) 07/08/2017 0942   BUN 29 (H) 07/12/2019 1057   BUN 17.7 07/08/2017 0942   CREATININE 0.97 07/12/2019 1057   CREATININE 1.18 (H) 12/25/2017 0959   CREATININE 1.0 07/08/2017 0942      Component Value Date/Time   CALCIUM 10.1 07/12/2019 1057   CALCIUM 10.4 07/08/2017 0942   ALKPHOS 141 (H) 07/12/2019 1057   ALKPHOS 232 (H) 07/08/2017 0942   AST 20 07/12/2019 1057   AST 50 (H) 12/25/2017 0959   AST 54 (H) 07/08/2017 0942   ALT 10 07/12/2019 1057   ALT 53 12/25/2017 0959   ALT 39 07/08/2017 0942   BILITOT 0.3 07/12/2019 1057   BILITOT 0.3 12/25/2017 0959   BILITOT 0.42 07/08/2017 0942       Lab Results  Component Value Date   LABCA2 58 (H) 09/14/2012    No components found for: [id number]  No results for input(s): INR in the last 168 hours.  Urinalysis    Component Value Date/Time   COLORURINE YELLOW 11/25/2017 0901   APPEARANCEUR HAZY (A) 11/25/2017 0901   LABSPEC 1.017 11/25/2017 0901   LABSPEC 1.030 07/13/2015 0841   PHURINE 5.0 11/25/2017 0901   GLUCOSEU NEGATIVE 11/25/2017 0901   GLUCOSEU Negative 07/13/2015 0841   HGBUR MODERATE (A) 11/25/2017 0901   BILIRUBINUR NEGATIVE 11/25/2017 0901   BILIRUBINUR Negative 07/13/2015 0841  KETONESUR NEGATIVE 11/25/2017 0901   PROTEINUR 100 (A) 11/25/2017 0901   UROBILINOGEN 0.2 07/13/2015 0841   NITRITE NEGATIVE 11/25/2017 0901   LEUKOCYTESUR NEGATIVE 11/25/2017 0901   LEUKOCYTESUR Negative 07/13/2015 0841    STUDIES: CT Chest W Contrast  Result Date: 07/09/2019 CLINICAL DATA:  Contrast-3m omni 300 F/u metastatic rt. Breast ca to bone with oral chemo ongoing and iv chemo completed 2006, Surgery-rt. Mastectomy with ln, lt. Hip, lumbar^777mOMNIPAQUE IOHEXOL 300 MG/ML SOLNInvasive breast cancer, stage IV, recurrence, eval EXAM: CT CHEST WITH CONTRAST TECHNIQUE: Multidetector CT imaging of the chest was performed during intravenous contrast administration. CONTRAST:  7524mOMNIPAQUE IOHEXOL 300 MG/ML  SOLN COMPARISON:  CT 05/13/2019 FINDINGS: Cardiovascular: No significant vascular findings. Normal heart size. No pericardial effusion. Mediastinum/Nodes: RIGHT axillary nodal dissection. Post RIGHT mastectomy anatomy. No axillary supraclavicular adenopathy. No internal mammary adenopathy. No mediastinal hilar adenopathy. Lungs/Pleura: Nodular pleural thickening at the RIGHT lung base similar. Largest nodule along the pleural surface measures 16 mm compared with 16 mm. Small peripheral nodule in the RIGHT lower lobe measuring 3 mm (image 113/5) is unchanged. A tiny nodule in the medial RIGHT lower lobe (image 96/5) is unchanged. Infrahilar nodule difficult to separate from the vasculature measuring 12 mm compares to 11 mm (image 81/5). No new or small nodules identified. Upper Abdomen: Low-density lesion LEFT hepatic lobe measuring 7 mm (image 138/2). Lesion is subtly apparent on CT 05/13/2019, but not readily apparent on remote CT scans. No clear lesion identified at this site on comparison FDG PET scan from 12/22/2018. Musculoskeletal: No aggressive osseous lesion. IMPRESSION: 1. Stable pulmonary nodules in the RIGHT lung. 2. Stable subtle skeletal metastasis including the RIGHT scapula and posterior RIGHT tenth rib. 3. Subtle low-density lesion in the LEFT hepatic lobe is indeterminate. Recommend close attention on follow-up or further evaluation with contrast abdominal MRI. These results will be called to the ordering clinician or representative by the Radiologist Assistant, and communication documented in the PACS or zVision Dashboard. Electronically Signed   By: SteSuzy BouchardD.   On: 07/09/2019 11:00   NM Bone Scan Whole Body  Result Date: 07/09/2019 CLINICAL DATA:  Metastatic RIGHT breast cancer, posterior BILATERAL rib pain EXAM: NUCLEAR MEDICINE WHOLE BODY BONE SCAN TECHNIQUE: Whole body anterior and posterior images were obtained approximately 3 hours after  intravenous injection of radiopharmaceutical. RADIOPHARMACEUTICALS:  21.2 mCi Technetium-48m75m IV COMPARISON:  05/13/2019 Correlation: CT chest 07/09/2019 FINDINGS: Abnormal increased tracer accumulation at BILATERAL posterior ribs, anterior RIGHT sixth rib, RIGHT scapula, L3, LEFT sacrum, and RIGHT femoral head consistent with osseous metastatic disease. LEFT hip prosthesis. Questionable focus of abnormal uptake at LEFT scapula unchanged. Uptake at the shoulders, typically degenerative. Uptake at RIGHT mandible typically related to dental disease. No new sites of abnormal osseous tracer accumulation are identified. Expected urinary tract and soft tissue distribution of tracer. IMPRESSION: Multiple sites of abnormal osseous uptake consistent with osseous metastatic disease, unchanged. No new scintigraphic abnormalities. Electronically Signed   By: MarkLavonia Dana.   On: 07/09/2019 16:19    ASSESSMENT: 68 y47. [place], [place] woman with stage IV breast cancer, on BOLERO-4 trial  (1) status post right mastectomy and sentinel lymph node sampling 03/22/2004 for a right upper-outer quadrant pT2 pN1, stage IIB invasive ductal carcinoma with lobular features, grade 2, estrogen receptor and progesterone receptor positive, HER-2 negative, with an MIB-1 of 9% ([id number]) addition all right axillary lymph node sampling 05/07/2004 showed 2 benign lymph nodes (4 lymph  nodes previously removed, so total was one positive lymph node out of 6; [id number])  (3) the patient was evaluated by radiation oncology; no postmastectomy radiation recommended  (4) adjuvant chemotherapy with dose dense doxorubicin and cyclophosphamide x4 cycles (first cycle delayed one week) followed by dose dense paclitaxel x4 was completed 09/18/2004  (5) tamoxifen started March 2006, discontinued 2009  METASTATIC DISEASE: February 2013 (6) presenting with a large pericardial effusion, large right pleural effusion and possible right  middle lobe bronchial obstruction February 2013, status post pericardial window placement, fiberoptic bronchoscopy and right Pleurx placement 09/11/2011, with biopsy of the bronchus intermedius and pericardium positive for metastatic breast cancer, estrogen receptor 91% positive with moderate staining intensity, progesterone receptor 100% positive with strong staining intensity, with an MIB-1 of 35%, and no HER-2 amplification, the signals ratio being 1.37 (SZA [id number])  (7) enrolled in BOLERO-4 trial 10/10/2011, receiving letrozole and everolimus  (a) two small areas of enhancement in the cerebellum noted by brain MRI 10/03/2011 were no longer apparent on repeat MRI 08/21/2012-- most recent brain MRI 04/01/2014 showed no evidence of intracranial metastatic disease  (b) sclerotic lesions in left iliac bone and sacrum have not been biopsied; stable; plan is to start zolendronate after patient updates her dental care (extraction planned)  (c) RLL lung nodule, stable (rescanned 07/12/2014 and 08/11/2014) R hilar and subcarinal lymph nodes: stable  (d) CT of the chest:03/25/2018, stable  (e) CT Angio 12/12/2018 stable  (f) off BOLERO trial (trial ended) as of July 2020, but continuing on study drugs  (8) additional problems:  (a) hepatic steatosis  (b) COPD/ emphysema/ asthma  (c) advanced L hip osteoarthritis, status post left total hip replacement 06/07/2015  (d) aortoiliac atherosclerosis  (e) dental evaluation pending w possible dental extractions  (f) likely thalassemia trait  (g) hyperlipidemia  (9) Bone density concerns: DEXA scan 08/11/2013 was normal  (a) repeat DEXA scan 04/09/2018 shows a T score of -1.8, osteopenia  (10) progression of bony disease:  (a) PET scan 12/22/2018 shows no hypermetabolic soft tissue metastases but multifocal bony metastasis  (b) denosumab/Xgeva started on 02/25/2019, repeated every 28 days (delayed due to dental extraction healing issues)  (c) bone scan  05/13/2019 serves as baseline study with increased activity in the right scapular, lower ribs and right femoral head (stable compared to PET scan of May 2020  (d) bone scan 07/09/2019 stable  (e) CT chest 07/09/2019 shows stable lung lesions but emerging left liver lobe 0.7 cm lesion  (f) liver MRI   PLAN: Esparanza is now a little over 7-1/2 years out from definitive diagnosis of metastatic breast cancer, with disease that is well controlled by CT of the chest and bone scan.  She continues on everolimus and letrozole, which she tolerates well.  She also started denosumab/Xgeva in July and she feels that has helped her bony aches and pains.  These are primary in the lower back.  She controls them with occasional analgesics and this does not keep her from having a normal functional status.  Concerns are the continuing rise in her CA 27-29 and the questionable one third of an inch lesion in the liver.  We have an order for liver MRI which is pending.  That should clarify that question.  The CT of the chest and bone scan however are stable.  She will continue to receive the denosumab/Xgeva every 28 days with labs every dose.  I am going to see her with the next dose so that we can  discuss results of the liver MRI further at that time.  She is keeping appropriate pandemic precautions  She knows to call for any other issue that may develop before the next visit. Virgie Dad. Armie Moren, MD Medical Oncology and Hematology [facility] [place], [place] [address] Tel. [phone]    Fax. [phone]   I, Wilburn Mylar, am acting as scribe for Dr. Virgie Dad. Kimberlyn Quiocho.  I, Lurline Del MD, have reviewed the above documentation for accuracy and completeness, and I agree with the above.

## 2019-07-12 NOTE — Progress Notes (Signed)
Pt here for doctors appt and xgeva injection today. She was not checked into her injection appt. Upon finding this out I went to the lobby and both waiting rooms to find her and could not find her. Her cell phone and home phone were both called and a message left on both. At 1540 received a phone call from the front desk that she is here and been waiting. Got the stuff together to give her the injection. She left before receiving the injection.

## 2019-07-13 ENCOUNTER — Other Ambulatory Visit: Payer: Self-pay | Admitting: Oncology

## 2019-07-13 ENCOUNTER — Inpatient Hospital Stay: Payer: Medicare Other

## 2019-07-13 VITALS — BP 112/62 | HR 100 | Temp 98.1°F | Resp 16

## 2019-07-13 DIAGNOSIS — R978 Other abnormal tumor markers: Secondary | ICD-10-CM | POA: Diagnosis not present

## 2019-07-13 DIAGNOSIS — C7801 Secondary malignant neoplasm of right lung: Secondary | ICD-10-CM | POA: Diagnosis not present

## 2019-07-13 DIAGNOSIS — K769 Liver disease, unspecified: Secondary | ICD-10-CM | POA: Diagnosis not present

## 2019-07-13 DIAGNOSIS — C7951 Secondary malignant neoplasm of bone: Secondary | ICD-10-CM

## 2019-07-13 DIAGNOSIS — C7989 Secondary malignant neoplasm of other specified sites: Secondary | ICD-10-CM | POA: Diagnosis not present

## 2019-07-13 DIAGNOSIS — C50911 Malignant neoplasm of unspecified site of right female breast: Secondary | ICD-10-CM

## 2019-07-13 DIAGNOSIS — C50411 Malignant neoplasm of upper-outer quadrant of right female breast: Secondary | ICD-10-CM | POA: Diagnosis not present

## 2019-07-13 LAB — AFP TUMOR MARKER: AFP, Serum, Tumor Marker: 2.7 ng/mL (ref 0.0–8.3)

## 2019-07-13 MED ORDER — DENOSUMAB 120 MG/1.7ML ~~LOC~~ SOLN
SUBCUTANEOUS | Status: AC
  Filled 2019-07-13: qty 1.7

## 2019-07-13 MED ORDER — DENOSUMAB 120 MG/1.7ML ~~LOC~~ SOLN
120.0000 mg | Freq: Once | SUBCUTANEOUS | Status: AC
  Administered 2019-07-13: 120 mg via SUBCUTANEOUS

## 2019-07-28 ENCOUNTER — Ambulatory Visit (HOSPITAL_COMMUNITY): Admission: RE | Admit: 2019-07-28 | Payer: Medicare Other | Source: Ambulatory Visit

## 2019-08-03 ENCOUNTER — Ambulatory Visit (HOSPITAL_COMMUNITY)
Admission: RE | Admit: 2019-08-03 | Discharge: 2019-08-03 | Disposition: A | Discharge status: Home / Self Care | Payer: Medicare Other | Source: Ambulatory Visit | Attending: Adult Health | Admitting: Adult Health

## 2019-08-03 ENCOUNTER — Other Ambulatory Visit: Payer: Self-pay | Admitting: Pharmacist

## 2019-08-03 ENCOUNTER — Other Ambulatory Visit: Payer: Self-pay

## 2019-08-03 DIAGNOSIS — C787 Secondary malignant neoplasm of liver and intrahepatic bile duct: Secondary | ICD-10-CM | POA: Insufficient documentation

## 2019-08-03 DIAGNOSIS — C50411 Malignant neoplasm of upper-outer quadrant of right female breast: Secondary | ICD-10-CM

## 2019-08-03 DIAGNOSIS — Z170 Estrogen receptor positive status [ER+]: Secondary | ICD-10-CM

## 2019-08-03 MED ORDER — EVEROLIMUS 10 MG PO TABS: 10.0000 mg | ORAL_TABLET | Freq: Every day | ORAL | 6 refills | Status: DC

## 2019-08-03 MED ORDER — GADOBUTROL 1 MMOL/ML IV SOLN
7.0000 mL | Freq: Once | INTRAVENOUS | Status: AC | PRN
  Administered 2019-08-03: 7 mL via INTRAVENOUS

## 2019-08-03 NOTE — Progress Notes (Signed)
Oral Chemotherapy Pharmacist Encounter   Afinitor prescription sent in error to local [facility]. Patient receives her medication for free from [facility] which uses [facility] to fill the medication. Prescription redirected to [facility].  Darl Pikes, PharmD, BCPS, [facility] Hematology/Oncology Clinical Pharmacist [facility] [phone]  08/03/2019 12:58 PM

## 2019-08-06 ENCOUNTER — Other Ambulatory Visit: Payer: Self-pay | Admitting: Adult Health

## 2019-08-06 DIAGNOSIS — C50911 Malignant neoplasm of unspecified site of right female breast: Secondary | ICD-10-CM

## 2019-08-06 DIAGNOSIS — C787 Secondary malignant neoplasm of liver and intrahepatic bile duct: Secondary | ICD-10-CM

## 2019-08-06 DIAGNOSIS — C7800 Secondary malignant neoplasm of unspecified lung: Secondary | ICD-10-CM

## 2019-08-06 NOTE — Progress Notes (Signed)
Called and spoke with patient about MRI results.  I let her know that I reviewed them with Dr. Jana Hakim and the recommendation is for biopsy.  I reviewed with Barbara Parks this process and that I will be putting orders in to do just that.  She plans on coming on Monday for her labs and appt with Dr. Jana Hakim, and Delton See to talk about this further.    Wilber Bihari, NP

## 2019-08-08 NOTE — Progress Notes (Signed)
[place]  [phone] [phone]    ID: Barbara Parks DOB: 02-May-1951  MR#: [id number]  TWS#:[id number]  Patient Care Team: [name], MD as PCP - General (Family Medicine) Jerline Pain, MD (Cardiology) Gaye Pollack, MD (Cardiothoracic Surgery) Roberto Romanoski, Virgie Dad, MD as Consulting Physician (Oncology) OTHER MD: Dr Erroll Luna- Merlene Laughter, DDS; Gaynelle Arabian MD   CHIEF COMPLAINT: metastatic breast cancer, formerly on BOLERO study  CURRENT TREATMENT: [ letrozole + everolimus]; denosumab/Xgeva   INTERVAL HISTORY: Barbara Parks returns today for follow-up and treatment of her metastatic breast cancer.  Her CT of the chest 07/09/2019 suggested a possible change in her liver.  Accordingly we obtained a liver MRI with and without contrast 08/03/2019.  This showed at least 8 liver lesions, the largest measuring 1.8 cm.  These are consistent with metastatic deposits.  She continues on letrozole.  She tolerates this with no side effects that she is aware of  She also continues on everolimus.  She has had no problems with mouth sores, cough, or unusual nausea or fatigue.  [name] receives [place] every 4 weeks, started July 2020.  She tolerates this well although she tells me last time she had to wait more than 2 hours to receive her dose.   We are following her CA 27-29, which has been rising. Lab Results  Component Value Date   CA2729 429.1 (H) 06/14/2019   CA2729 310.1 (H) 04/22/2019   CA2729 264.8 (H) 02/25/2019   CA2729 224.5 (H) 01/04/2019   CA2729 175.1 (H) 11/19/2018    REVIEW OF SYSTEMS: Anala denies loss of appetite altered taste or nausea or vomiting problems.  She does not have any abdominal discomfort.  There has been no change in bowel or bladder habits.  Her weight is slowly improving, from 126 in July to 131 now.  It is generally pretty stable.  There have been no unusual headaches visual changes cough phlegm production pleurisy or  fever.  Over the holidays she visited her sister in [place].  That sister has breast cancer and is currently receiving chemotherapy.  For exercise Lyndell walks about every other day.  She is having more problems in her right hip area and lower back.  A detailed review of systems today was otherwise stable    BREAST CANCER HISTORY: From Dr. Collier [name]'s original intake note 04/13/2004:  "This woman has been in good health all of her life. She recently moved from [place] to work here.  She palpated a mass at about the 12 o'clock position in mid-July. She has not noticed any nipple retraction or skin changes.  She was seen by her primary care doctor who subsequently referred her for a mammogram.  Mammogram was performed on 03/01/04. This demonstrated a spiculated 2 cm mass at the 12 o'clock position in the right breast.  Upper outer quadrant of the left breast shows some distortion.   Physical exam at that time showed a firm, nontender nodule at the 12 o'clock position in the right breast, 5 cm from the nipple.  Ultrasound of this area showed a hypoechoic ill-defined mass, measuring 2.2 x 1.3 x 1.4 cm.  Physical exam of the left breast showed general vague thickening, upper outer quadrant of the left breast with a discrete palpable mass. The ultrasound performed showed a single hypoechoic ill-defined nodule at the 1 o'clock position, measuring 7 x 5 x 6 mm.    She had biopsies of both lesions on 03/02/03.  Needle core biopsy  of the lesion on the right breast revealed invasive mammary carcinoma.  Needle core biopsy of the left breast showed a complex fibroadenoma.  Prognostic panel of the lesion on the right breast showed it to be ER positive at 73%, PR positive at 90% and proliferative index 9%, HER-2 was 1+.  Patient was referred to Dr. Annamaria Boots, who performed a simple mastectomy with sentinel lymph node evaluation on 03/22/04.  Final pathology showed this to be an invasive ductal carcinoma  with lobular  features, measuring 2.2 cm, grade 2 of 3.  Margins negative for carcinoma.  Invasive ductal carcinoma was extended to involve deep dermis of the nipple.  Lymphovascular invasion was identified.  Total of 4 sentinel lymph nodes were evaluated.  Touch imprints at the time of the OR was felt to be negative.  Subsequent evaluation showed a 5 mm focus of metastatic carcinoma in one of the four lymph nodes on microscopic after sectioning.  There was extracapsular extension  of one lymph node as well. The remaining three lymph nodes were all negative."  The patient's subsequent history is detailed above.   PAST MEDICAL HISTORY: Past Medical History:  Diagnosis Date  . Arthritis    left hip  . Asthma   . breast ca 2005   breast/chemo R mastectomy  . Dermatitis   . Diabetes mellitus without complication ([place])   . GERD (gastroesophageal reflux disease)   . Hypercholesteremia   . Metastasis to lung ([place]) dx'd 08/2011  . Osteopenia due to cancer therapy 09/09/2013  . Palpitations   . Personal history of chemotherapy   . Shortness of breath     PAST SURGICAL HISTORY: Past Surgical History:  Procedure Laterality Date  . BREAST SURGERY  2005   right  . CATARACT EXTRACTION W/PHACO Left 01/18/2014   Procedure: CATARACT EXTRACTION PHACO AND INTRAOCULAR LENS PLACEMENT (IOC);  Surgeon: Elta Guadeloupe T. Gershon Crane, MD;  Location: [facility];  Service: Ophthalmology;  Laterality: Left;  CDE 15.79  . CATARACT EXTRACTION W/PHACO Right 02/08/2014   Procedure: CATARACT EXTRACTION PHACO AND INTRAOCULAR LENS PLACEMENT (IOC);  Surgeon: Elta Guadeloupe T. Gershon Crane, MD;  Location: [facility];  Service: Ophthalmology;  Laterality: Right;  CDE 4.41  . CHEST TUBE INSERTION  09/11/2011   Procedure: INSERTION PLEURAL DRAINAGE CATHETER;  Surgeon: Gaye Pollack, MD;  Location: [facility];  Service: Thoracic;  Laterality: Right;  . MASTECTOMY Right   . PERICARDIAL WINDOW  09/11/2011   Procedure: PERICARDIAL WINDOW;  Surgeon: Gaye Pollack, MD;  Location: [facility]  OR;  Service: Thoracic;  Laterality: N/A;  . REMOVAL OF PLEURAL DRAINAGE CATHETER  12/19/2011   Procedure: REMOVAL OF PLEURAL DRAINAGE CATHETER;  Surgeon: Gaye Pollack, MD;  Location: [facility];  Service: Thoracic;  Laterality: Right;  TO BE DONE IN MINOR ROOM, SHORT STAY  . [place]  . TOTAL HIP ARTHROPLASTY Left 06/07/2015   Procedure: LEFT TOTAL HIP ARTHROPLASTY ANTERIOR APPROACH;  Surgeon: Gaynelle Arabian, MD;  Location: [facility];  Service: Orthopedics;  Laterality: Left;  Marland Kitchen VIDEO BRONCHOSCOPY  09/11/2011   Procedure: VIDEO BRONCHOSCOPY;  Surgeon: Gaye Pollack, MD;  Location: [facility] OR;  Service: Thoracic;  Laterality: N/A;    FAMILY HISTORY Family History  Problem Relation Age of Onset  . Cancer Mother        breast  . Heart disease Father   . Diabetes Other   . Anesthesia problems Neg Hx    The patient's mother was diagnosed with breast cancer at age 1,  she died age 2 from congestive heart failure.The patient's father died from heart disease at age 68.  She has one sister alive & well.  Two brothers alive & well, one with diabetes. The patient's sister was also diagnosed with breast cancer, and was tested for the BRCA gene, and was negative. The patient herself has not been tested. There is no history of ovarian cancer in the family   GYNECOLOGIC HISTORY:  No LMP recorded. Patient is postmenopausal. Menarche age 9, the patient is GX P0. She stopped having periods with chemotherapy in 2005. 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 She was a Archivist. She is single, lives alone with her Shitzu-poodle Sammie.. Family is all in the [place] area.     ADVANCED DIRECTIVES: Not in place. At the 02/23/2014 visit the patient was given the appropriate documents to complete and notarize at her discretion. She tells me she is planning to name her sister, Billie Ruddy,  as healthcare power of attorney. [name] can be reached at [phone]   HEALTH MAINTENANCE: Social History   Tobacco Use  . Smoking status: Former Smoker    Packs/day: 1.50    Years: 30.00    Pack years: 45.00    Types: Cigarettes    Quit date: 09/10/2007    Years since quitting: 11.9  . Smokeless tobacco: Never Used  Substance Use Topics  . Alcohol use: No  . Drug use: No    Colonoscopy:  PAP:  Bone density: 04/09/2018, T score -1.8  Lipid panel:  Allergies  Allergen Reactions  . Aspirin     REACTION: upset stomach  . Latex Other (See Comments)    Blistering and skin peels off  . Nsaids Nausea And Vomiting    Extreme nausea and vomiting    Current Outpatient Medications  Medication Sig Dispense Refill  . cholecalciferol (VITAMIN D3) 25 MCG (1000 UT) tablet Take 2 tablets (2,000 Units total) by mouth daily. 180 tablet 4  . cyclobenzaprine (FLEXERIL) 10 MG tablet Take 1 tablet (10 mg total) by mouth 2 (two) times daily as needed for muscle spasms. 20 tablet 0  . everolimus (AFINITOR) 10 MG tablet Take 1 tablet (10 mg total) by mouth daily. 28 tablet 6  . gemfibrozil (LOPID) 600 MG tablet TAKE 1 TABLET (600 MG TOTAL) BY MOUTH 2 (TWO) TIMES DAILY BEFORE A MEAL. 180 tablet 1  . letrozole (FEMARA) 2.5 MG tablet Take 1 tablet (2.5 mg total) by mouth daily. 90 tablet 3  . loperamide (IMODIUM) 2 MG capsule Take 2 mg by mouth 4 (four) times daily as needed for diarrhea or loose stools. Reported on 11/30/2015    . metFORMIN (GLUCOPHAGE-XR) 500 MG 24 hr tablet Take 1 tablet (500 mg total) by mouth 3 (three) times daily before meals.  3  . traMADol (ULTRAM) 50 MG tablet Take 0.5-1 tablets (25-50 mg total) by mouth every 6 (six) hours as needed. 30 tablet 0   No current facility-administered medications for this visit.    OBJECTIVE: Middle-aged white woman who appears stated age  69:   08/09/19 1104  BP: 121/77  Pulse: 96  Resp: 18  Temp: 98.5 F (36.9 C)  SpO2: 100%      Body mass index is 22.22 kg/m.   Filed Weights   08/09/19 1104  Weight: 131 lb 8 oz (59.6 kg)   ECOG  FS:1 - Symptomatic but completely ambulatory  Sclerae unicteric, EOMs intact Wearing a mask No cervical or supraclavicular adenopathy Lungs no rales or rhonchi Heart regular rate and rhythm Abd soft, nontender, positive bowel sounds MSK no focal spinal tenderness, no upper extremity lymphedema Neuro: nonfocal, well oriented, appropriate affect Breasts: The right breast is status post mastectomy.  There is no evidence of chest wall recurrence.  The left breast is unremarkable.  LAB  RESULTS:  CMP     Component Value Date/Time   NA 138 08/09/2019 1045   NA 139 07/08/2017 0942   K 4.4 08/09/2019 1045   K 3.8 07/08/2017 0942   CL 108 08/09/2019 1045   CL 109 (H) 12/31/2012 0940   CO2 18 (L) 08/09/2019 1045   CO2 18 (L) 07/08/2017 0942   GLUCOSE 145 (H) 08/09/2019 1045   GLUCOSE 156 (H) 07/08/2017 0942   GLUCOSE 127 (H) 12/31/2012 0940   BUN 23 08/09/2019 1045   BUN 17.7 07/08/2017 0942   CREATININE 1.05 (H) 08/09/2019 1045   CREATININE 1.18 (H) 12/25/2017 0959   CREATININE 1.0 07/08/2017 0942   CALCIUM 9.7 08/09/2019 1045   CALCIUM 10.4 07/08/2017 0942   PROT 8.6 (H) 08/09/2019 1045   PROT 7.7 07/08/2017 0942   ALBUMIN 3.9 08/09/2019 1045   ALBUMIN 3.4 (L) 07/08/2017 0942   AST 18 08/09/2019 1045   AST 50 (H) 12/25/2017 0959   AST 54 (H) 07/08/2017 0942   ALT 11 08/09/2019 1045   ALT 53 12/25/2017 0959   ALT 39 07/08/2017 0942   ALKPHOS 145 (H) 08/09/2019 1045   ALKPHOS 232 (H) 07/08/2017 0942   BILITOT 0.2 (L) 08/09/2019 1045   BILITOT 0.3 12/25/2017 0959   BILITOT 0.42 07/08/2017 0942   GFRNONAA 55 (L) 08/09/2019 1045   GFRNONAA 47 (L) 12/25/2017 0959   GFRAA >60 08/09/2019 1045   GFRAA 54 (L) 12/25/2017 0959    I No results found for: SPEP  Lab Results  Component Value Date   WBC 7.1 08/09/2019   NEUTROABS 5.2 08/09/2019   HGB 14.3 08/09/2019   HCT  45.4 08/09/2019   MCV 83.3 08/09/2019   PLT 307 08/09/2019      Chemistry      Component Value Date/Time   NA 138 08/09/2019 1045   NA 139 07/08/2017 0942   K 4.4 08/09/2019 1045   K 3.8 07/08/2017 0942   CL 108 08/09/2019 1045   CL 109 (H) 12/31/2012 0940   CO2 18 (L) 08/09/2019 1045   CO2 18 (L) 07/08/2017 0942   BUN 23 08/09/2019 1045   BUN 17.7 07/08/2017 0942   CREATININE 1.05 (H) 08/09/2019 1045   CREATININE 1.18 (H) 12/25/2017 0959   CREATININE 1.0 07/08/2017 0942      Component Value Date/Time   CALCIUM 9.7 08/09/2019 1045   CALCIUM 10.4 07/08/2017 0942   ALKPHOS 145 (H) 08/09/2019 1045   ALKPHOS 232 (H) 07/08/2017 0942   AST 18 08/09/2019 1045   AST 50 (H) 12/25/2017 0959   AST 54 (H) 07/08/2017 0942   ALT 11 08/09/2019 1045   ALT 53 12/25/2017 0959   ALT 39 07/08/2017 0942   BILITOT 0.2 (L) 08/09/2019 1045   BILITOT 0.3 12/25/2017 0959   BILITOT 0.42 07/08/2017 0942       Lab Results  Component Value Date   LABCA2 58 (H) 09/14/2012    No components found for: [id number]  No results for input(s): INR in the last 168 hours.  Urinalysis  Component Value Date/Time   COLORURINE YELLOW 11/25/2017 0901   APPEARANCEUR HAZY (A) 11/25/2017 0901   LABSPEC 1.017 11/25/2017 0901   LABSPEC 1.030 07/13/2015 0841   PHURINE 5.0 11/25/2017 0901   GLUCOSEU NEGATIVE 11/25/2017 0901   GLUCOSEU Negative 07/13/2015 0841   HGBUR MODERATE (A) 11/25/2017 0901   BILIRUBINUR NEGATIVE 11/25/2017 0901   BILIRUBINUR Negative 07/13/2015 0841   KETONESUR NEGATIVE 11/25/2017 0901   PROTEINUR 100 (A) 11/25/2017 0901   UROBILINOGEN 0.2 07/13/2015 0841   NITRITE NEGATIVE 11/25/2017 0901   LEUKOCYTESUR NEGATIVE 11/25/2017 0901   LEUKOCYTESUR Negative 07/13/2015 0841    STUDIES: MR LIVER W WO CONTRAST  Result Date: 08/03/2019 CLINICAL DATA:  Indeterminate liver lesion on CT. MRI recommended for further characterization. Breast cancer patient. EXAM: MRI ABDOMEN WITHOUT AND  WITH CONTRAST TECHNIQUE: Multiplanar multisequence MR imaging of the abdomen was performed both before and after the administration of intravenous contrast. CONTRAST:  73m GADAVIST GADOBUTROL 1 MMOL/ML IV SOLN COMPARISON:  CT 07/09/2019 FINDINGS: Lower chest:  Lung bases are clear. Hepatobiliary: Several round lesions within the LEFT and RIGHT hepatic lobe are hyperintense on T2 weighted imaging (series 5). Example lesion measures 1.8 cm on image 17. Example lesion in the LEFT hepatic lobe along the falciform ligament measures 1.1 cm (image 13). Approximately 8 lesions in liver. These lesions demonstrate peripheral arterial enhancement (image 52/13 for example)). Pancreas: Normal pancreatic parenchymal intensity. No ductal dilatation or inflammation. Spleen: Normal spleen. Adrenals/urinary tract: Adrenal glands and kidneys are normal. Stomach/Bowel: Stomach and limited of the small bowel is unremarkable Vascular/Lymphatic: Abdominal aortic normal caliber. No retroperitoneal periportal lymphadenopathy. Musculoskeletal: Insert enhancing lesions within the lumbar spine. For example 2.3 cm lesion in the L3 vertebral body (image 60/13). Rounded 1.7 cm lesion in the L5 IMPRESSION: 1. Multiple round enhancing lesions in liver consistent with HEPATIC METASTASIS. Approximately 8 discrete lesions. 2. 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 Electronically Signed   By: SSuzy BouchardM.D.   On: 08/03/2019 14:20    ASSESSMENT: 69y.o. [place], [place] woman with stage IV breast cancer, on BOLERO-4 trial  (1) status post right mastectomy and sentinel lymph node sampling 03/22/2004 for a right upper-outer quadrant pT2 pN1, stage IIB invasive ductal carcinoma with lobular features, grade 2, estrogen receptor and progesterone receptor positive, HER-2 negative, with an MIB-1 of 9% (([id number]  (2) addition all right axillary lymph node sampling 05/07/2004 showed 2 benign lymph nodes (4 lymph nodes  previously removed, so total was one positive lymph node out of 6; [id number])  (3) the patient was evaluated by radiation oncology; no postmastectomy radiation recommended  (4) adjuvant chemotherapy with dose dense doxorubicin and cyclophosphamide x4 cycles (first cycle delayed one week) followed by dose dense paclitaxel x4 was completed 09/18/2004  (5) tamoxifen started March 2006, discontinued 2009  METASTATIC DISEASE: February 2013 (6) presenting with a large pericardial effusion, large right pleural effusion and possible right middle lobe bronchial obstruction February 2013, status post pericardial window placement, fiberoptic bronchoscopy and right Pleurx placement 09/11/2011, with biopsy of the bronchus intermedius and pericardium positive for metastatic breast cancer, estrogen receptor 91% positive with moderate staining intensity, progesterone receptor 100% positive with strong staining intensity, with an MIB-1 of 35%, and no HER-2 amplification, the signals ratio being 1.37 ([id number] [id number])  (7) enrolled in BOLERO-4 trial 10/10/2011, receiving letrozole and everolimus  (a) two small areas of enhancement in the cerebellum noted by brain MRI 10/03/2011 were no longer apparent on repeat MRI 08/21/2012-- most  recent brain MRI 04/01/2014 showed no evidence of intracranial metastatic disease  (b) sclerotic lesions in left iliac bone and sacrum have not been biopsied; stable; to start zolendronate after patient updates her dental care (extraction planned)--never started  (c) RLL lung nodule, stable (rescanned 07/12/2014 and 08/11/2014) R hilar and subcarinal lymph nodes: stable  (d) CT of the chest:03/25/2018, stable  (e) CT Angio 12/12/2018 stable  (f) off BOLERO trial (trial ended) as of July 2020, but continuing on study drugs  (8) additional problems:  (a) hepatic steatosis  (b) COPD/ emphysema/ asthma  (c) advanced L hip osteoarthritis, status post left total hip replacement 06/07/2015  (d)  aortoiliac atherosclerosis  (e) dental evaluation pending w possible dental extractions  (f) likely thalassemia trait  (g) hyperlipidemia  (9) Bone density concerns: DEXA scan 08/11/2013 was normal  (a) repeat DEXA scan 04/09/2018 shows a T score of -1.8, osteopenia  (10) disease monitoring:  (a) PET scan 12/22/2018 shows no hypermetabolic soft tissue metastases but multifocal bony metastasis  (b) denosumab/Xgeva started on 02/25/2019, repeated every 28 days (delayed due to dental extraction healing issues)  (c) bone scan 05/13/2019 serves as baseline study with increased activity in the right scapular, lower ribs and right femoral head (stable compared to PET scan of May 2020  (d) bone scan 07/09/2019 stable  (e) CT chest 07/09/2019 shows stable lung lesions but emerging left liver lobe 0.7 cm lesion  (f) liver MRI 08/03/2019 shows approximately 8 liver lesions, the largest measuring 1.8 cm.  (g) liver biopsy  PLAN: Miriya is now just about 7 years out from definitive diagnosis of metastatic breast cancer.  She has had an unusually stable course but now does appear to have disease progression.  The chief problems are pain in the right hip and lower back and new liver lesions.  The liver lesions should be biopsied hopefully this week.  We need to know if her cancer has changed.  It could be now estrogen receptor negative or it could be HER-2 positive.  Either of those would significantly alter our treatment plan.  Of course we could also be dealing with something that is not breast cancer.  For example she could have colon cancer.  She has never had a colonoscopy.  I checked a CEA today, however the biopsies should tell us fairly definitely what we are dealing with.  If we are dealing with breast cancer and it is still estrogen receptor positive I would switch her to fulvestrant and palbociclib.  I am setting her up to see me on January 26 and we could start the fulvestrant that day and the  palbociclib the next day.  I have entered the appropriate orders so they can be pre-approved..  As far as the right hip and lower back are concerned I offered to refer her to radiation for palliative treatment.  At this point however she prefers to continue to exercise and do her own physical therapy.  If the pain worsens she will let me know and we will send her to adjuvant radiation  She knows to call for any other issue that may develop before the next visit.   Virgie Dad. Malic Rosten, MD Medical Oncology and Hematology [facility] [place], [place] [address] Tel. [phone]    Fax. [phone]   I, Wilburn Mylar, am acting as scribe for Dr. Virgie Dad. Dasani Thurlow.  Lindie Spruce MD, have reviewed the above documentation for accuracy and completeness, and I agree  with the above.   *Total Encounter Time as defined by the Centers for Medicare and Medicaid Services includes, in addition to the face-to-face time of a patient visit (documented in the note above) non-face-to-face time: obtaining and reviewing outside history, ordering and reviewing medications, tests or procedures, care coordination (communications with other health care professionals or caregivers) and documentation in the medical record.

## 2019-08-09 ENCOUNTER — Inpatient Hospital Stay: Payer: Medicare Other

## 2019-08-09 ENCOUNTER — Encounter (HOSPITAL_COMMUNITY): Payer: Self-pay

## 2019-08-09 ENCOUNTER — Telehealth: Payer: Self-pay | Admitting: Oncology

## 2019-08-09 ENCOUNTER — Inpatient Hospital Stay: Payer: Medicare Other | Attending: Oncology

## 2019-08-09 ENCOUNTER — Telehealth: Payer: Self-pay

## 2019-08-09 ENCOUNTER — Inpatient Hospital Stay (HOSPITAL_BASED_OUTPATIENT_CLINIC_OR_DEPARTMENT_OTHER): Payer: Medicare Other | Admitting: Oncology

## 2019-08-09 ENCOUNTER — Other Ambulatory Visit: Payer: Self-pay

## 2019-08-09 VITALS — BP 121/77 | HR 96 | Temp 98.5°F | Resp 18 | Ht 64.5 in | Wt 131.5 lb

## 2019-08-09 DIAGNOSIS — I313 Pericardial effusion (noninflammatory): Secondary | ICD-10-CM

## 2019-08-09 DIAGNOSIS — C7951 Secondary malignant neoplasm of bone: Secondary | ICD-10-CM | POA: Diagnosis not present

## 2019-08-09 DIAGNOSIS — I3131 Malignant pericardial effusion in diseases classified elsewhere: Secondary | ICD-10-CM

## 2019-08-09 DIAGNOSIS — Z5111 Encounter for antineoplastic chemotherapy: Secondary | ICD-10-CM | POA: Insufficient documentation

## 2019-08-09 DIAGNOSIS — C50411 Malignant neoplasm of upper-outer quadrant of right female breast: Secondary | ICD-10-CM | POA: Insufficient documentation

## 2019-08-09 DIAGNOSIS — C50911 Malignant neoplasm of unspecified site of right female breast: Secondary | ICD-10-CM

## 2019-08-09 DIAGNOSIS — C801 Malignant (primary) neoplasm, unspecified: Secondary | ICD-10-CM | POA: Diagnosis not present

## 2019-08-09 DIAGNOSIS — Z170 Estrogen receptor positive status [ER+]: Secondary | ICD-10-CM

## 2019-08-09 DIAGNOSIS — C7800 Secondary malignant neoplasm of unspecified lung: Secondary | ICD-10-CM

## 2019-08-09 DIAGNOSIS — C787 Secondary malignant neoplasm of liver and intrahepatic bile duct: Secondary | ICD-10-CM | POA: Insufficient documentation

## 2019-08-09 LAB — CBC WITH DIFFERENTIAL/PLATELET
Abs Immature Granulocytes: 0.04 K/uL (ref 0.00–0.07)
Basophils Absolute: 0.1 K/uL (ref 0.0–0.1)
Basophils Relative: 1 %
Eosinophils Absolute: 0.1 K/uL (ref 0.0–0.5)
Eosinophils Relative: 1 %
HCT: 45.4 % (ref 36.0–46.0)
Hemoglobin: 14.3 g/dL (ref 12.0–15.0)
Immature Granulocytes: 1 %
Lymphocytes Relative: 15 %
Lymphs Abs: 1.1 K/uL (ref 0.7–4.0)
MCH: 26.2 pg (ref 26.0–34.0)
MCHC: 31.5 g/dL (ref 30.0–36.0)
MCV: 83.3 fL (ref 80.0–100.0)
Monocytes Absolute: 0.7 K/uL (ref 0.1–1.0)
Monocytes Relative: 10 %
Neutro Abs: 5.2 K/uL (ref 1.7–7.7)
Neutrophils Relative %: 72 %
Platelets: 307 K/uL (ref 150–400)
RBC: 5.45 MIL/uL — ABNORMAL HIGH (ref 3.87–5.11)
RDW: 14.5 % (ref 11.5–15.5)
WBC: 7.1 K/uL (ref 4.0–10.5)
nRBC: 0 % (ref 0.0–0.2)

## 2019-08-09 LAB — COMPREHENSIVE METABOLIC PANEL
ALT: 11 U/L (ref 0–44)
AST: 18 U/L (ref 15–41)
Albumin: 3.9 g/dL (ref 3.5–5.0)
Alkaline Phosphatase: 145 U/L — ABNORMAL HIGH (ref 38–126)
Anion gap: 12 (ref 5–15)
BUN: 23 mg/dL (ref 8–23)
CO2: 18 mmol/L — ABNORMAL LOW (ref 22–32)
Calcium: 9.7 mg/dL (ref 8.9–10.3)
Chloride: 108 mmol/L (ref 98–111)
Creatinine, Ser: 1.05 mg/dL — ABNORMAL HIGH (ref 0.44–1.00)
GFR calc Af Amer: >60 mL/min (ref >60)
GFR calc non Af Amer: 55 mL/min — ABNORMAL LOW (ref >60)
Glucose, Bld: 145 mg/dL — ABNORMAL HIGH (ref 70–99)
Potassium: 4.4 mmol/L (ref 3.5–5.1)
Sodium: 138 mmol/L (ref 135–145)
Total Bilirubin: 0.2 mg/dL — ABNORMAL LOW (ref 0.3–1.2)
Total Protein: 8.6 g/dL — ABNORMAL HIGH (ref 6.5–8.1)

## 2019-08-09 LAB — CEA (IN HOUSE-CHCC): CEA (CHCC-In House): 1.34 ng/mL (ref 0.00–5.00)

## 2019-08-09 MED ORDER — PALBOCICLIB 125 MG PO CAPS: 125.0000 mg | ORAL_CAPSULE | Freq: Every day | ORAL | 6 refills | Status: DC

## 2019-08-09 MED ORDER — DENOSUMAB 120 MG/1.7ML ~~LOC~~ SOLN
SUBCUTANEOUS | Status: AC
  Filled 2019-08-09: qty 1.7

## 2019-08-09 MED ORDER — DENOSUMAB 120 MG/1.7ML ~~LOC~~ SOLN
120.0000 mg | Freq: Once | SUBCUTANEOUS | Status: AC
  Administered 2019-08-09: 12:00:00 120 mg via SUBCUTANEOUS

## 2019-08-09 NOTE — Telephone Encounter (Addendum)
Patient has no prescription insurance. I was able to obtain a free trial card to get patient started on Ibrance.  I am filling out an application for attempt to obtain free drug from [facility] [place] Phone [phone] Fax [phone] 08/09/2019 3:17 PM

## 2019-08-09 NOTE — Telephone Encounter (Signed)
I left a message regarding schedule  

## 2019-08-09 NOTE — Patient Instructions (Signed)
Denosumab injection What is this medicine? DENOSUMAB (den oh sue mab) slows bone breakdown. Prolia is used to treat osteoporosis in women after menopause and in men, and in people who are taking corticosteroids for 6 months or more. Xgeva is used to treat a high calcium level due to cancer and to prevent bone fractures and other bone problems caused by multiple myeloma or cancer bone metastases. Xgeva is also used to treat giant cell tumor of the bone. This medicine may be used for other purposes; ask your health care provider or pharmacist if you have questions. COMMON BRAND NAME(S): Prolia, XGEVA What should I tell my health care provider before I take this medicine? They need to know if you have any of these conditions:  dental disease  having surgery or tooth extraction  infection  kidney disease  low levels of calcium or Vitamin D in the blood  malnutrition  on hemodialysis  skin conditions or sensitivity  thyroid or parathyroid disease  an unusual reaction to denosumab, other medicines, foods, dyes, or preservatives  pregnant or trying to get pregnant  breast-feeding How should I use this medicine? This medicine is for injection under the skin. It is given by a health care professional in a hospital or clinic setting. A special MedGuide will be given to you before each treatment. Be sure to read this information carefully each time. For Prolia, talk to your pediatrician regarding the use of this medicine in children. Special care may be needed. For Xgeva, talk to your pediatrician regarding the use of this medicine in children. While this drug may be prescribed for children as young as 13 years for selected conditions, precautions do apply. Overdosage: If you think you have taken too much of this medicine contact a poison control center or emergency room at once. NOTE: This medicine is only for you. Do not share this medicine with others. What if I miss a dose? It is  important not to miss your dose. Call your doctor or health care professional if you are unable to keep an appointment. What may interact with this medicine? Do not take this medicine with any of the following medications:  other medicines containing denosumab This medicine may also interact with the following medications:  medicines that lower your chance of fighting infection  steroid medicines like prednisone or cortisone This list may not describe all possible interactions. Give your health care provider a list of all the medicines, herbs, non-prescription drugs, or dietary supplements you use. Also tell them if you smoke, drink alcohol, or use illegal drugs. Some items may interact with your medicine. What should I watch for while using this medicine? Visit your doctor or health care professional for regular checks on your progress. Your doctor or health care professional may order blood tests and other tests to see how you are doing. Call your doctor or health care professional for advice if you get a fever, chills or sore throat, or other symptoms of a cold or flu. Do not treat yourself. This drug may decrease your body's ability to fight infection. Try to avoid being around people who are sick. You should make sure you get enough calcium and vitamin D while you are taking this medicine, unless your doctor tells you not to. Discuss the foods you eat and the vitamins you take with your health care professional. See your dentist regularly. Brush and floss your teeth as directed. Before you have any dental work done, tell your dentist you are   receiving this medicine. Do not become pregnant while taking this medicine or for 5 months after stopping it. Talk with your doctor or health care professional about your birth control options while taking this medicine. Women should inform their doctor if they wish to become pregnant or think they might be pregnant. There is a potential for serious side  effects to an unborn child. Talk to your health care professional or pharmacist for more information. What side effects may I notice from receiving this medicine? Side effects that you should report to your doctor or health care professional as soon as possible:  allergic reactions like skin rash, itching or hives, swelling of the face, lips, or tongue  bone pain  breathing problems  dizziness  jaw pain, especially after dental work  redness, blistering, peeling of the skin  signs and symptoms of infection like fever or chills; cough; sore throat; pain or trouble passing urine  signs of low calcium like fast heartbeat, muscle cramps or muscle pain; pain, tingling, numbness in the hands or feet; seizures  unusual bleeding or bruising  unusually weak or tired Side effects that usually do not require medical attention (report to your doctor or health care professional if they continue or are bothersome):  constipation  diarrhea  headache  joint pain  loss of appetite  muscle pain  runny nose  tiredness  upset stomach This list may not describe all possible side effects. Call your doctor for medical advice about side effects. You may report side effects to FDA at 1-800-FDA-1088. Where should I keep my medicine? This medicine is only given in a clinic, doctor's office, or other health care setting and will not be stored at home. NOTE: This sheet is a summary. It may not cover all possible information. If you have questions about this medicine, talk to your doctor, pharmacist, or health care provider.  2020 Elsevier/Gold Standard (2017-11-21 16:10:44)

## 2019-08-09 NOTE — Telephone Encounter (Signed)
LVM with scheduler requesting they call pt to get her scheduled for a liver biopsy this week.

## 2019-08-09 NOTE — Progress Notes (Signed)
[name] Female, 69 y.o., 06-19-1951 MRN:  [id number] Phone:  [phone] Jerilynn Mages) PCP:  [name], MD Coverage:  Medicare/Medicare Part A And B Next Appt With Radiology ([facility]-US 2) 08/16/2019 at 1:00 PM  RE: BIOPSY Received: 2 days ago Message Contents  Greggory Keen, MD  Ernestene Mention for US liver met bx   See MR 08/03/19   TS   Previous Messages  ----- Message -----  From: Lenore Cordia  Sent: 08/06/2019  4:49 PM EST  To: Ir Procedure Requests  Subject: BIOPSY                      Procedure Requested: US BIOPSY (LIVER)    Reason for Procedure: metastatic breast cancer, recent liver progression    Provider Requesting: Gardenia Phlegm  Provider Telephone: [phone]    Other Info: RAD EXAMS IN Epic

## 2019-08-10 ENCOUNTER — Encounter: Payer: Self-pay | Admitting: Pharmacist

## 2019-08-10 ENCOUNTER — Telehealth: Payer: Self-pay | Admitting: Pharmacist

## 2019-08-10 DIAGNOSIS — C50911 Malignant neoplasm of unspecified site of right female breast: Secondary | ICD-10-CM

## 2019-08-10 LAB — CANCER ANTIGEN 27.29: CA 27.29: 396 U/mL — ABNORMAL HIGH (ref 0.0–38.6)

## 2019-08-10 NOTE — Telephone Encounter (Signed)
Oral Oncology Pharmacist Encounter  Received new prescription for Ibrance (palbociclib) for the treatment of metastatic breast cancer ER/PR positive, HER2 negative in conjunction with fulvestrant, planned duration until disease progression or unacceptable drug toxicity.  CBC/CMP from 08/09/19 assessed, no relevant lab abnormalities. Prescription dose and frequency assessed.   Current medication list in Epic reviewed, no DDIs with palbociclib identified.  Prescription has been e-scribed to the [facility] for benefits analysis and approval.  Oral Oncology Clinic will continue to follow for insurance authorization, copayment issues, initial counseling and start date.  Darl Pikes, PharmD, BCPS, [facility] Hematology/Oncology Clinical Pharmacist [facility] [place] [facility] [phone]  08/10/2019 2:22 PM

## 2019-08-11 MED ORDER — PALBOCICLIB 125 MG PO TABS: 125.0000 mg | ORAL_TABLET | Freq: Every day | ORAL | 6 refills | Status: DC

## 2019-08-12 ENCOUNTER — Other Ambulatory Visit: Payer: Self-pay | Admitting: Radiology

## 2019-08-12 NOTE — Telephone Encounter (Signed)
Oral Chemotherapy Pharmacist Encounter  Patient will use [facility] for the first fill of Ibrance using an [place] one time voucher while we try and set her up for manufacturer assistance. Patient knows to bring the medication with her to her appt on 1/26 and await the go ahead from Dr. Jana Hakim.  Patient Education I spoke with patient for overview of new oral chemotherapy medication: Ibrance (palbociclib) for the treatment of metastatic breast cancer ER/PR positive, HER2 negative in conjunction with fulvestrant, planned duration until disease progression or unacceptable drug toxicity. Plan to start ~08/23/2018  Pt is doing well. Counseled patient on administration, dosing, side effects, monitoring, drug-food interactions, safe handling, storage, and disposal. Patient will take 1 tablet (125 mg total) by mouth daily. Take for 21 days on, 7 days off, repeat every 28 days.  Side effects include but not limited to: decreased wbc, N/V, fatigue, hair loss.    Reviewed with patient importance of keeping a medication schedule and plan for any missed doses.  Ms. Cavanaugh voiced understanding and appreciation. All questions answered. Medication handout placed in the mail.  Provided patient with [facility] phone number. Patient knows to call the office with questions or concerns. Oral [facility]otherapy [facility] will continue to follow.  Darl Pikes, PharmD, BCPS, [facility] Hematology/Oncology Clinical Pharmacist [facility] [phone]  08/12/2019 3:17 PM

## 2019-08-13 ENCOUNTER — Other Ambulatory Visit: Payer: Self-pay | Admitting: Physician Assistant

## 2019-08-13 MED FILL — IBRANCE 125 MG TABS: 125 | 21 days supply | Qty: 21 | Fill #0

## 2019-08-16 ENCOUNTER — Other Ambulatory Visit: Payer: Self-pay

## 2019-08-16 ENCOUNTER — Ambulatory Visit (HOSPITAL_COMMUNITY)
Admission: RE | Admit: 2019-08-16 | Discharge: 2019-08-16 | Disposition: A | Discharge status: Home / Self Care | Payer: Medicare Other | Source: Ambulatory Visit | Attending: Adult Health | Admitting: Adult Health

## 2019-08-16 ENCOUNTER — Encounter (HOSPITAL_COMMUNITY): Payer: Self-pay

## 2019-08-16 DIAGNOSIS — E119 Type 2 diabetes mellitus without complications: Secondary | ICD-10-CM | POA: Diagnosis not present

## 2019-08-16 DIAGNOSIS — Z853 Personal history of malignant neoplasm of breast: Secondary | ICD-10-CM | POA: Insufficient documentation

## 2019-08-16 DIAGNOSIS — Z961 Presence of intraocular lens: Secondary | ICD-10-CM | POA: Diagnosis not present

## 2019-08-16 DIAGNOSIS — Z8249 Family history of ischemic heart disease and other diseases of the circulatory system: Secondary | ICD-10-CM | POA: Diagnosis not present

## 2019-08-16 DIAGNOSIS — Z170 Estrogen receptor positive status [ER+]: Secondary | ICD-10-CM | POA: Diagnosis not present

## 2019-08-16 DIAGNOSIS — Z87891 Personal history of nicotine dependence: Secondary | ICD-10-CM | POA: Diagnosis not present

## 2019-08-16 DIAGNOSIS — Z9221 Personal history of antineoplastic chemotherapy: Secondary | ICD-10-CM | POA: Diagnosis not present

## 2019-08-16 DIAGNOSIS — K769 Liver disease, unspecified: Secondary | ICD-10-CM | POA: Diagnosis present

## 2019-08-16 DIAGNOSIS — C787 Secondary malignant neoplasm of liver and intrahepatic bile duct: Secondary | ICD-10-CM

## 2019-08-16 DIAGNOSIS — Z9842 Cataract extraction status, left eye: Secondary | ICD-10-CM | POA: Diagnosis not present

## 2019-08-16 DIAGNOSIS — Z9841 Cataract extraction status, right eye: Secondary | ICD-10-CM | POA: Diagnosis not present

## 2019-08-16 DIAGNOSIS — Z7984 Long term (current) use of oral hypoglycemic drugs: Secondary | ICD-10-CM | POA: Diagnosis not present

## 2019-08-16 DIAGNOSIS — Z886 Allergy status to analgesic agent status: Secondary | ICD-10-CM | POA: Diagnosis not present

## 2019-08-16 DIAGNOSIS — Z9011 Acquired absence of right breast and nipple: Secondary | ICD-10-CM | POA: Diagnosis not present

## 2019-08-16 DIAGNOSIS — Z79899 Other long term (current) drug therapy: Secondary | ICD-10-CM | POA: Diagnosis not present

## 2019-08-16 DIAGNOSIS — Z91040 Latex allergy status: Secondary | ICD-10-CM | POA: Diagnosis not present

## 2019-08-16 DIAGNOSIS — Z96642 Presence of left artificial hip joint: Secondary | ICD-10-CM | POA: Insufficient documentation

## 2019-08-16 DIAGNOSIS — K7689 Other specified diseases of liver: Secondary | ICD-10-CM | POA: Diagnosis not present

## 2019-08-16 DIAGNOSIS — E7800 Pure hypercholesterolemia, unspecified: Secondary | ICD-10-CM | POA: Insufficient documentation

## 2019-08-16 DIAGNOSIS — C7951 Secondary malignant neoplasm of bone: Secondary | ICD-10-CM | POA: Diagnosis not present

## 2019-08-16 DIAGNOSIS — C50411 Malignant neoplasm of upper-outer quadrant of right female breast: Secondary | ICD-10-CM | POA: Diagnosis not present

## 2019-08-16 DIAGNOSIS — C50919 Malignant neoplasm of unspecified site of unspecified female breast: Secondary | ICD-10-CM | POA: Diagnosis not present

## 2019-08-16 LAB — CBC
HCT: 43.9 % (ref 36.0–46.0)
Hemoglobin: 13.6 g/dL (ref 12.0–15.0)
MCH: 26.1 pg (ref 26.0–34.0)
MCHC: 31 g/dL (ref 30.0–36.0)
MCV: 84.3 fL (ref 80.0–100.0)
Platelets: 325 10*3/uL (ref 150–400)
RBC: 5.21 MIL/uL — ABNORMAL HIGH (ref 3.87–5.11)
RDW: 14.2 % (ref 11.5–15.5)
WBC: 7.4 10*3/uL (ref 4.0–10.5)
nRBC: 0 % (ref 0.0–0.2)

## 2019-08-16 LAB — PROTIME-INR
INR: 0.9 (ref 0.8–1.2)
Prothrombin Time: 12.2 seconds (ref 11.4–15.2)

## 2019-08-16 LAB — GLUCOSE, CAPILLARY: Glucose-Capillary: 120 mg/dL — ABNORMAL HIGH (ref 70–99)

## 2019-08-16 MED ORDER — FENTANYL CITRATE (PF) 100 MCG/2ML IJ SOLN
INTRAMUSCULAR | Status: AC
  Filled 2019-08-16: qty 2

## 2019-08-16 MED ORDER — FENTANYL CITRATE (PF) 100 MCG/2ML IJ SOLN
INTRAMUSCULAR | Status: AC | PRN
  Administered 2019-08-16: 25 ug via INTRAVENOUS
  Administered 2019-08-16: 50 ug via INTRAVENOUS
  Administered 2019-08-16: 25 ug via INTRAVENOUS

## 2019-08-16 MED ORDER — MIDAZOLAM HCL 2 MG/2ML IJ SOLN
INTRAMUSCULAR | Status: AC | PRN
  Administered 2019-08-16: 0.5 mg via INTRAVENOUS
  Administered 2019-08-16: 1 mg via INTRAVENOUS
  Administered 2019-08-16: 0.5 mg via INTRAVENOUS

## 2019-08-16 MED ORDER — MIDAZOLAM HCL 2 MG/2ML IJ SOLN
INTRAMUSCULAR | Status: AC
  Filled 2019-08-16: qty 2

## 2019-08-16 MED ORDER — GELATIN ABSORBABLE 12-7 MM EX MISC
CUTANEOUS | Status: AC
  Filled 2019-08-16: qty 1

## 2019-08-16 MED ORDER — SODIUM CHLORIDE 0.9 % IV SOLN: INTRAVENOUS | Status: DC

## 2019-08-16 MED ORDER — LIDOCAINE HCL (PF) 1 % IJ SOLN
INTRAMUSCULAR | Status: AC
  Filled 2019-08-16: qty 30

## 2019-08-16 MED ORDER — OXYCODONE HCL 5 MG PO TABS: 5.0000 mg | ORAL_TABLET | ORAL | Status: DC | PRN

## 2019-08-16 NOTE — Procedures (Signed)
Interventional Radiology Procedure:   Indications: History of breast cancer and liver lesions  Procedure: US guided liver lesion biopsy  Findings: 4 cores from left hepatic lesion  Complications: None     EBL: less than 10 ml  Plan: Bedrest 3 hours.    Sheyna Pettibone R. Anselm Pancoast, MD  Pager: [phone]

## 2019-08-16 NOTE — H&P (Signed)
Chief Complaint: Liver lesion  Referring Physician(s): Causey,Lindsey Cornetto  Supervising Physician: Markus Daft  Patient Status: [facility] - Out-pt  History of Present Illness: Barbara Parks is a 69 y.o. female with metastatic estrogen receptor positive breast cancer.  Restaging scans on 07/09/2019. Chest CT showed: stable right lung pulmonary nodules; stable subtle skeletal metastasis including right scapula and posterior right 10th rib; indeterminate, subtle low-density lesion in left hepatic lobe.  MRI done 08/03/19 = 1. Multiple round enhancing lesions in liver consistent with HEPATIC METASTASIS. Approximately 8 discrete lesions. 2. 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  We are asked to perform a biopsy of a liver lesion for tissue diagnosis.  She is a brittle diabetic and states she cannot go very long without eating.  She ate breakfast at 8:30. She tells me she was told she could eat 4 hours prior to procedure.  No nausea/vomiting. No Fever/chills. ROS negative.   Past Medical History:  Diagnosis Date  . Arthritis    left hip  . Asthma   . breast ca 2005   breast/chemo R mastectomy  . Dermatitis   . Diabetes mellitus without complication ([place])   . GERD (gastroesophageal reflux disease)   . Hypercholesteremia   . Metastasis to lung ([place]) dx'd 08/2011  . Osteopenia due to cancer therapy 09/09/2013  . Palpitations   . Personal history of chemotherapy   . Shortness of breath     Past Surgical History:  Procedure Laterality Date  . BREAST SURGERY  2005   right  . CATARACT EXTRACTION W/PHACO Left 01/18/2014   Procedure: CATARACT EXTRACTION PHACO AND INTRAOCULAR LENS PLACEMENT (IOC);  Surgeon: Elta Guadeloupe T. Gershon Crane, MD;  Location: [facility];  Service: Ophthalmology;  Laterality: Left;  CDE 15.79  . CATARACT EXTRACTION W/PHACO Right 02/08/2014   Procedure: CATARACT EXTRACTION PHACO AND INTRAOCULAR LENS PLACEMENT (IOC);  Surgeon: Elta Guadeloupe T. Gershon Crane, MD;   Location: [facility];  Service: Ophthalmology;  Laterality: Right;  CDE 4.41  . CHEST TUBE INSERTION  09/11/2011   Procedure: INSERTION PLEURAL DRAINAGE CATHETER;  Surgeon: Gaye Pollack, MD;  Location: [place];  Service: Thoracic;  Laterality: Right;  . MASTECTOMY Right   . PERICARDIAL WINDOW  09/11/2011   Procedure: PERICARDIAL WINDOW;  Surgeon: Gaye Pollack, MD;  Location: [facility] OR;  Service: Thoracic;  Laterality: N/A;  . REMOVAL OF PLEURAL DRAINAGE CATHETER  12/19/2011   Procedure: REMOVAL OF PLEURAL DRAINAGE CATHETER;  Surgeon: Gaye Pollack, MD;  Location: [place];  Service: Thoracic;  Laterality: Right;  TO BE DONE IN MINOR ROOM, SHORT STAY  . [place]  . TOTAL HIP ARTHROPLASTY Left 06/07/2015   Procedure: LEFT TOTAL HIP ARTHROPLASTY ANTERIOR APPROACH;  Surgeon: Gaynelle Arabian, MD;  Location: [facility];  Service: Orthopedics;  Laterality: Left;  Marland Kitchen VIDEO BRONCHOSCOPY  09/11/2011   Procedure: VIDEO BRONCHOSCOPY;  Surgeon: Gaye Pollack, MD;  Location: [facility] OR;  Service: Thoracic;  Laterality: N/A;    Allergies: Aspirin, Latex, and Nsaids  Medications: Prior to Admission medications   Medication Sig Start Date End Date Taking? Authorizing Provider  cholecalciferol (VITAMIN D3) 25 MCG (1000 UT) tablet Take 2 tablets (2,000 Units total) by mouth daily. 09/03/18  Yes Magrinat, Virgie Dad, MD  gemfibrozil (LOPID) 600 MG tablet TAKE 1 TABLET (600 MG TOTAL) BY MOUTH 2 (TWO) TIMES DAILY BEFORE A MEAL. 07/13/19  Yes Magrinat, Virgie Dad, MD  loperamide (IMODIUM) 2 MG capsule Take 2 mg by mouth 4 (four)  times daily as needed for diarrhea or loose stools. Reported on 11/30/2015   Yes [provider]  metFORMIN (GLUCOPHAGE-XR) 500 MG 24 hr tablet Take 1 tablet (500 mg total) by mouth 3 (three) times daily before meals. 06/11/18  Yes Magrinat, Virgie Dad, MD  cyclobenzaprine (FLEXERIL) 10 MG tablet Take 1 tablet (10 mg total) by mouth 2 (two) times daily as needed for muscle spasms. 12/12/18   Noemi Chapel, MD  palbociclib [facility]) 125 MG tablet Take 1 tablet (125 mg total) by mouth daily. Take for 21 days on, 7 days off, repeat every 28 days. 08/11/19   Magrinat, Virgie Dad, MD  traMADol (ULTRAM) 50 MG tablet Take 0.5-1 tablets (25-50 mg total) by mouth every 6 (six) hours as needed. 02/25/19   Gardenia Phlegm, NP     Family History  Problem Relation Age of Onset  . Cancer Mother        breast  . Heart disease Father   . Diabetes Other   . Anesthesia problems Neg Hx     Social History   Socioeconomic History  . Marital status: Single    Spouse name: Not on file  . Number of children: Not on file  . Years of education: Not on file  . Highest education level: Not on file  Occupational History  . Not on file  Tobacco Use  . Smoking status: Former Smoker    Packs/day: 1.50    Years: 30.00    Pack years: 45.00    Types: Cigarettes    Quit date: 09/10/2007    Years since quitting: 11.9  . Smokeless tobacco: Never Used  Substance and Sexual Activity  . Alcohol use: No  . Drug use: No  . Sexual activity: Not Currently  Other Topics Concern  . Not on file  Social History Narrative  . Not on file   Social Determinants of Health   Financial Resource Strain:   . Difficulty of Paying Living Expenses: Not on file  Food Insecurity:   . Worried About Charity fundraiser in the Last Year: Not on file  . Ran Out of Food in the Last Year: Not on file  Transportation Needs:   . Lack of Transportation (Medical): Not on file  . Lack of Transportation (Non-Medical): Not on file  Physical Activity:   . Days of Exercise per Week: Not on file  . Minutes of Exercise per Session: Not on file  Stress:   . Feeling of Stress : Not on file  Social Connections:   . Frequency of Communication with Friends and Family: Not on file  . Frequency of Social Gatherings with Friends and Family: Not on file  . Attends Religious Services: Not on file  . Active Member of Clubs or  Organizations: Not on file  . Attends Archivist Meetings: Not on file  . Marital Status: Not on file     Review of Systems: A 12 point ROS discussed and pertinent positives are indicated in the HPI above.  All other systems are negative.  Review of Systems  Vital Signs: BP 107/77   Pulse (!) 101   Temp 97.6 F (36.4 C)   Ht 5' 4.5" (1.638 m)   Wt 58.5 kg   SpO2 99%   BMI 21.80 kg/m   Physical Exam Vitals reviewed.  Constitutional:      Appearance: Normal appearance.  HENT:     Head: Normocephalic and atraumatic.  Eyes:  Extraocular Movements: Extraocular movements intact.  Cardiovascular:     Rate and Rhythm: Normal rate and regular rhythm.  Pulmonary:     Effort: Pulmonary effort is normal. No respiratory distress.     Breath sounds: Normal breath sounds.  Abdominal:     General: There is no distension.     Palpations: Abdomen is soft.     Tenderness: There is no abdominal tenderness.  Musculoskeletal:        General: Normal range of motion.     Cervical back: Normal range of motion.  Skin:    General: Skin is warm and dry.  Neurological:     General: No focal deficit present.     Mental Status: She is alert and oriented to person, place, and time.  Psychiatric:        Mood and Affect: Mood normal.        Behavior: Behavior normal.        Thought Content: Thought content normal.        Judgment: Judgment normal.     Imaging: MR LIVER W WO CONTRAST  Result Date: 08/03/2019 CLINICAL DATA:  Indeterminate liver lesion on CT. MRI recommended for further characterization. Breast cancer patient. EXAM: MRI ABDOMEN WITHOUT AND WITH CONTRAST TECHNIQUE: Multiplanar multisequence MR imaging of the abdomen was performed both before and after the administration of intravenous contrast. CONTRAST:  1mL GADAVIST GADOBUTROL 1 MMOL/ML IV SOLN COMPARISON:  CT 07/09/2019 FINDINGS: Lower chest:  Lung bases are clear. Hepatobiliary: Several round lesions within the  LEFT and RIGHT hepatic lobe are hyperintense on T2 weighted imaging (series 5). Example lesion measures 1.8 cm on image 17. Example lesion in the LEFT hepatic lobe along the falciform ligament measures 1.1 cm (image 13). Approximately 8 lesions in liver. These lesions demonstrate peripheral arterial enhancement (image 52/13 for example)). Pancreas: Normal pancreatic parenchymal intensity. No ductal dilatation or inflammation. Spleen: Normal spleen. Adrenals/urinary tract: Adrenal glands and kidneys are normal. Stomach/Bowel: Stomach and limited of the small bowel is unremarkable Vascular/Lymphatic: Abdominal aortic normal caliber. No retroperitoneal periportal lymphadenopathy. Musculoskeletal: Insert enhancing lesions within the lumbar spine. For example 2.3 cm lesion in the L3 vertebral body (image 60/13). Rounded 1.7 cm lesion in the L5 IMPRESSION: 1. Multiple round enhancing lesions in liver consistent with HEPATIC METASTASIS. Approximately 8 discrete lesions. 2. 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 Electronically Signed   By: Suzy Bouchard M.D.   On: 08/03/2019 14:20    Labs:  CBC: Recent Labs    06/14/19 0924 07/12/19 1057 08/09/19 1045 08/16/19 1144  WBC 7.4 6.4 7.1 7.4  HGB 15.1* 14.3 14.3 13.6  HCT 46.5* 44.6 45.4 43.9  PLT 324 294 307 325    COAGS: Recent Labs    08/16/19 1144  INR 0.9    BMP: Recent Labs    05/17/19 0814 06/14/19 0924 07/12/19 1057 08/09/19 1045  NA 140 138 138 138  K 4.5 4.3 4.5 4.4  CL 109 107 110 108  CO2 18* 18* 18* 18*  GLUCOSE 104* 110* 93 145*  BUN 30* 33* 29* 23  CALCIUM 10.3 10.0 10.1 9.7  CREATININE 1.09* 1.32* 0.97 1.05*  GFRNONAA 52* 41* >60 55*  GFRAA >60 48* >60 >60    LIVER FUNCTION TESTS: Recent Labs    05/17/19 0814 06/14/19 0924 07/12/19 1057 08/09/19 1045  BILITOT 0.2* 0.3 0.3 0.2*  AST 21 21 20 18   ALT 11 13 10 11   ALKPHOS 133* 157* 141* 145*  PROT 8.3*  8.6* 8.0 8.6*  ALBUMIN 3.9 4.1 3.8 3.9     TUMOR MARKERS: No results for input(s): AFPTM, CEA, CA199, CHROMGRNA in the last 8760 hours.  Assessment and Plan:  Metastatic estrogen receptor positive breast cancer.  MRI showed multiple round enhancing lesions in liver consistent with HEPATIC METASTASIS.   Will proceed with biopsy of liver lesion by Dr. Anselm Pancoast.  Risks and benefits of liver biopsy was discussed with the patient and/or patient's family including, but not limited to bleeding, infection, damage to adjacent structures or low yield requiring additional tests.  All of the questions were answered and there is agreement to proceed.  Consent signed and in chart.  Thank you for this interesting consult.  I greatly enjoyed meeting Callieann Kromer Monrreal and look forward to participating in their care.  A copy of this report was sent to the requesting provider on this date.  Electronically Signed: Murrell Redden, PA-C   08/16/2019, 12:23 PM      I spent a total of  30 Minutes in face to face in clinical consultation, greater than 50% of which was counseling/coordinating care for liver lesion biopsy.

## 2019-08-16 NOTE — Discharge Instructions (Signed)
Liver Biopsy, Care After °These instructions give you information on caring for yourself after your procedure. Your doctor may also give you more specific instructions. Call your doctor if you have any problems or questions after your procedure. °What can I expect after the procedure? °After the procedure, it is common to have: °· Pain and soreness where the biopsy was done. °· Bruising around the area where the biopsy was done. °· Sleepiness and be tired for a few days. °Follow these instructions at home: °Medicines °· Take over-the-counter and prescription medicines only as told by your doctor. °· If you were prescribed an antibiotic medicine, take it as told by your doctor. Do not stop taking the antibiotic even if you start to feel better. °· Do not take medicines such as aspirin and ibuprofen. These medicines can thin your blood. Do not take these medicines unless your doctor tells you to take them. °· If you are taking prescription pain medicine, take actions to prevent or treat constipation. Your doctor may recommend that you: °? Drink enough fluid to keep your pee (urine) clear or pale yellow. °? Take over-the-counter or prescription medicines. °? Eat foods that are high in fiber, such as fresh fruits and vegetables, whole grains, and beans. °? Limit foods that are high in fat and processed sugars, such as fried and sweet foods. °Caring for your cut °· Follow instructions from your doctor about how to take care of your cuts from surgery (incisions). Make sure you: °? Wash your hands with soap and water before you change your bandage (dressing). If you cannot use soap and water, use hand sanitizer. °? Change your bandage as told by your doctor. °? Leave stitches (sutures), skin glue, or skin tape (adhesive) strips in place. They may need to stay in place for 2 weeks or longer. If tape strips get loose and curl up, you may trim the loose edges. Do not remove tape strips completely unless your doctor says it is  okay. °· Check your cuts every day for signs of infection. Check for: °? Redness, swelling, or more pain. °? Fluid or blood. °? Pus or a bad smell. °? Warmth. °· Do not take baths, swim, or use a hot tub until your doctor says it is okay to do so. °Activity ° °· Rest at home for 1-2 days or as told by your doctor. °? Avoid sitting for a long time without moving. Get up to take short walks every 1-2 hours. °· Return to your normal activities as told by your doctor. Ask what activities are safe for you. °· Do not do these things in the first 24 hours: °? Drive. °? Use machinery. °? Take a bath or shower. °· Do not lift more than 10 pounds (4.5 kg) or play contact sports for the first 2 weeks. °General instructions ° °· Do not drink alcohol in the first week after the procedure. °· Have someone stay with you for at least 24 hours after the procedure. °· Get your test results. Ask your doctor or the department that is doing the test: °? When will my results be ready? °? How will I get my results? °? What are my treatment options? °? What other tests do I need? °? What are my next steps? °· Keep all follow-up visits as told by your doctor. This is important. °Contact a doctor if: °· A cut bleeds and leaves more than just a small spot of blood. °· A cut is red, puffs up (  swells), or hurts more than before. °· Fluid or something else comes from a cut. °· A cut smells bad. °· You have a fever or chills. °Get help right away if: °· You have swelling, bloating, or pain in your belly (abdomen). °· You get dizzy or faint. °· You have a rash. °· You feel sick to your stomach (nauseous) or throw up (vomit). °· You have trouble breathing, feel short of breath, or feel faint. °· Your chest hurts. °· You have problems talking or seeing. °· You have trouble with your balance or moving your arms or legs. °Summary °· After the procedure, it is common to have pain, soreness, bruising, and tiredness. °· Your doctor will tell you how to  take care of yourself at home. Change your bandage, take your medicines, and limit your activities as told by your doctor. °· Call your doctor if you have symptoms of infection. Get help right away if your belly swells, your cut bleeds a lot, or you have trouble talking or breathing. °This information is not intended to replace advice given to you by your health care provider. Make sure you discuss any questions you have with your health care provider. °Document Revised: 07/25/2017 Document Reviewed: 07/25/2017 °Elsevier Patient Education © 2020 [employer] ° °

## 2019-08-17 ENCOUNTER — Telehealth: Payer: Self-pay | Admitting: *Deleted

## 2019-08-17 NOTE — Telephone Encounter (Signed)
Thanks Val.  I want to make sure you sent it under [facility]?

## 2019-08-17 NOTE — Telephone Encounter (Addendum)
Request for Caris studies to be performed on liver biopsy performed 08/16/2019 faxed with confirmation.

## 2019-08-20 LAB — SURGICAL PATHOLOGY

## 2019-08-23 NOTE — Progress Notes (Signed)
[place]  [phone] [phone]    ID: Barbara Parks DOB: 02-06-1951  MR#: [id number]  WSF#:[id number]  Patient Care Team: [name], MD as PCP - General (Family Medicine) Jerline Pain, MD (Cardiology) Gaye Pollack, MD (Cardiothoracic Surgery) Haik Mahoney, Virgie Dad, MD as Consulting Physician (Oncology) OTHER MD: Dr Erroll Luna- Merlene Laughter, DDS; Gaynelle Arabian MD   CHIEF COMPLAINT: metastatic breast cancer, formerly on BOLERO study  CURRENT TREATMENT: Fulvestrant, palbociclib, denosumab/Xgeva   INTERVAL HISTORY: Story returns today for follow-up and treatment of her metastatic breast cancer.  Since her last visit, she underwent liver biopsy on 08/16/2019 of one of the lesions seen on recent MRI. Pathology from the procedure ([id number]) revealed: metastatic carcinoma to liver. Prognostic indicators significant for: estrogen receptor, 100% positive with moderate staining intensity and progesterone receptor, 100% positive with strong staining intensity. Proliferation marker Ki67 at 5%. HER2 equivocal by immunohistochemistry (2+), but negative by fluorescent in situ hybridization with a signals ratio 1.22 and number per cell 1.95.  Given the disease progression and since her liver biopsy confirmed her cancer is still estrogen receptor positive, we are switching [name] to fulvestrant and palbociclib. She will be started on the fulvestrant today and the palbociclib tomorrow.  Regan also receives [place] every 4 weeks, started July 2020.  Her most recent dose was earlier this month and her next dose will be 09/21/2019  We are following her CA 27-29, which had been rising. Lab Results  Component Value Date   CA2729 396.0 (H) 08/09/2019   CA2729 429.1 (H) 06/14/2019   CA2729 310.1 (H) 04/22/2019   CA2729 264.8 (H) 02/25/2019   CA2729 224.5 (H) 01/04/2019    REVIEW OF SYSTEMS: Kynzee did well with her liver biopsy.  The worst part was lying  there for 6 hours post biopsy she says.  She still had some discomfort afterwards, and yesterday she developed some pain in the right upper lateral chest which was not related to breathing or position and which may relate to the biopsy as well.  Possibly she had a small bleed.  For exercise she continues to do walking.  She tells me she has a good appetite and a good sense of taste no change in weight no nausea or vomiting and no change in bowel or bladder habits.  Detailed review of systems was otherwise stable.    BREAST CANCER HISTORY: From Dr. Collier [name]'s original intake note 04/13/2004:  "This woman has been in good health all of her life. She recently moved from [place] to work here.  She palpated a mass at about the 12 o'clock position in mid-July. She has not noticed any nipple retraction or skin changes.  She was seen by her primary care doctor who subsequently referred her for a mammogram.  Mammogram was performed on 03/01/04. This demonstrated a spiculated 2 cm mass at the 12 o'clock position in the right breast.  Upper outer quadrant of the left breast shows some distortion.   Physical exam at that time showed a firm, nontender nodule at the 12 o'clock position in the right breast, 5 cm from the nipple.  Ultrasound of this area showed a hypoechoic ill-defined mass, measuring 2.2 x 1.3 x 1.4 cm.  Physical exam of the left breast showed general vague thickening, upper outer quadrant of the left breast with a discrete palpable mass. The ultrasound performed showed a single hypoechoic ill-defined nodule at the 1 o'clock position, measuring 7 x 5 x 6 mm.  She had biopsies of both lesions on 03/02/03.  Needle core biopsy of the lesion on the right breast revealed invasive mammary carcinoma.  Needle core biopsy of the left breast showed a complex fibroadenoma.  Prognostic panel of the lesion on the right breast showed it to be ER positive at 73%, PR positive at 90% and proliferative index 9%, HER-2  was 1+.  Patient was referred to Dr. Annamaria Boots, who performed a simple mastectomy with sentinel lymph node evaluation on 03/22/04.  Final pathology showed this to be an invasive ductal carcinoma  with lobular features, measuring 2.2 cm, grade 2 of 3.  Margins negative for carcinoma.  Invasive ductal carcinoma was extended to involve deep dermis of the nipple.  Lymphovascular invasion was identified.  Total of 4 sentinel lymph nodes were evaluated.  Touch imprints at the time of the OR was felt to be negative.  Subsequent evaluation showed a 5 mm focus of metastatic carcinoma in one of the four lymph nodes on microscopic after sectioning.  There was extracapsular extension  of one lymph node as well. The remaining three lymph nodes were all negative."  The patient's subsequent history is detailed above.   PAST MEDICAL HISTORY: Past Medical History:  Diagnosis Date  . Arthritis    left hip  . Asthma   . breast ca 2005   breast/chemo R mastectomy  . Dermatitis   . Diabetes mellitus without complication ([place])   . GERD (gastroesophageal reflux disease)   . Hypercholesteremia   . Metastasis to lung ([place]) dx'd 08/2011  . Osteopenia due to cancer therapy 09/09/2013  . Palpitations   . Personal history of chemotherapy   . Shortness of breath     PAST SURGICAL HISTORY: Past Surgical History:  Procedure Laterality Date  . BREAST SURGERY  2005   right  . CATARACT EXTRACTION W/PHACO Left 01/18/2014   Procedure: CATARACT EXTRACTION PHACO AND INTRAOCULAR LENS PLACEMENT (IOC);  Surgeon: Elta Guadeloupe T. Gershon Crane, MD;  Location: [facility];  Service: Ophthalmology;  Laterality: Left;  CDE 15.79  . CATARACT EXTRACTION W/PHACO Right 02/08/2014   Procedure: CATARACT EXTRACTION PHACO AND INTRAOCULAR LENS PLACEMENT (IOC);  Surgeon: Elta Guadeloupe T. Gershon Crane, MD;  Location: [facility];  Service: Ophthalmology;  Laterality: Right;  CDE 4.41  . CHEST TUBE INSERTION  09/11/2011   Procedure: INSERTION PLEURAL DRAINAGE CATHETER;  Surgeon: Gaye Pollack, MD;  Location: [facility];  Service: Thoracic;  Laterality: Right;  . MASTECTOMY Right   . PERICARDIAL WINDOW  09/11/2011   Procedure: PERICARDIAL WINDOW;  Surgeon: Gaye Pollack, MD;  Location: [facility] OR;  Service: Thoracic;  Laterality: N/A;  . REMOVAL OF PLEURAL DRAINAGE CATHETER  12/19/2011   Procedure: REMOVAL OF PLEURAL DRAINAGE CATHETER;  Surgeon: Gaye Pollack, MD;  Location: [facility];  Service: Thoracic;  Laterality: Right;  TO BE DONE IN MINOR ROOM, SHORT STAY  . [place]  . TOTAL HIP ARTHROPLASTY Left 06/07/2015   Procedure: LEFT TOTAL HIP ARTHROPLASTY ANTERIOR APPROACH;  Surgeon: Gaynelle Arabian, MD;  Location: [facility];  Service: Orthopedics;  Laterality: Left;  Marland Kitchen VIDEO BRONCHOSCOPY  09/11/2011   Procedure: VIDEO BRONCHOSCOPY;  Surgeon: Gaye Pollack, MD;  Location: [facility] OR;  Service: Thoracic;  Laterality: N/A;    FAMILY HISTORY Family History  Problem Relation Age of Onset  . Cancer Mother        breast  . Heart disease Father   . Diabetes Other   . Anesthesia problems Neg Hx  The patient's mother was diagnosed with breast cancer at age 44, she died age 6 from congestive heart failure.The patient's father died from heart disease at age 28.  She has one sister alive & well.  Two brothers alive & well, one with diabetes. The patient's sister was also diagnosed with breast cancer, and was tested for the BRCA gene, and was negative. The patient herself has not been tested. There is no history of ovarian cancer in the family   GYNECOLOGIC HISTORY:  No LMP recorded. Patient is postmenopausal. Menarche age 19, the patient is GX P0. She stopped having periods with chemotherapy in 2005. 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 She was a Archivist. She is single, lives alone with her Shitzu-poodle Sammie.. Family is all in the [place] area.     ADVANCED  DIRECTIVES: Not in place. At the 02/23/2014 visit the patient was given the appropriate documents to complete and notarize at her discretion. She tells me she is planning to name her sister, Billie Ruddy, as healthcare power of attorney. [name] can be reached at [phone]   HEALTH MAINTENANCE: Social History   Tobacco Use  . Smoking status: Former Smoker    Packs/day: 1.50    Years: 30.00    Pack years: 45.00    Types: Cigarettes    Quit date: 09/10/2007    Years since quitting: 11.9  . Smokeless tobacco: Never Used  Substance Use Topics  . Alcohol use: No  . Drug use: No    Colonoscopy:  PAP:  Bone density: 04/09/2018, T score -1.8  Lipid panel:  Allergies  Allergen Reactions  . Aspirin     REACTION: upset stomach  . Latex Other (See Comments)    Blistering and skin peels off  . Nsaids Nausea And Vomiting    Extreme nausea and vomiting    Current Outpatient Medications  Medication Sig Dispense Refill  . cholecalciferol (VITAMIN D3) 25 MCG (1000 UT) tablet Take 2 tablets (2,000 Units total) by mouth daily. 180 tablet 4  . cyclobenzaprine (FLEXERIL) 10 MG tablet Take 1 tablet (10 mg total) by mouth 2 (two) times daily as needed for muscle spasms. 20 tablet 0  . gemfibrozil (LOPID) 600 MG tablet TAKE 1 TABLET (600 MG TOTAL) BY MOUTH 2 (TWO) TIMES DAILY BEFORE A MEAL. 180 tablet 1  . loperamide (IMODIUM) 2 MG capsule Take 2 mg by mouth 4 (four) times daily as needed for diarrhea or loose stools. Reported on 11/30/2015    . metFORMIN (GLUCOPHAGE-XR) 500 MG 24 hr tablet Take 1 tablet (500 mg total) by mouth 3 (three) times daily before meals.  3  . palbociclib (IBRANCE) 125 MG tablet Take 1 tablet (125 mg total) by mouth daily. Take for 21 days on, 7 days off, repeat every 28 days. 21 tablet 6  . traMADol (ULTRAM) 50 MG tablet Take 0.5-1 tablets (25-50 mg total) by mouth every 6 (six) hours as needed. 30 tablet 0   No current facility-administered medications for this visit.     Facility-Administered Medications Ordered in Other Visits  Medication Dose Route Frequency Provider Last Rate Last Admin  . fulvestrant (FASLODEX) injection 500 mg  500 mg Intramuscular Once Tiffney Haughton, Virgie Dad, MD        OBJECTIVE: Middle-aged white woman in no acute distress  Vitals:   08/24/19 1406  BP: 113/75  Pulse: 78  Resp: 16  Temp: 98.2 F (36.8 C)  SpO2: 100%     Body mass index is 22.29 kg/m.   Filed Weights   08/24/19 1406  Weight: 131 lb 14.4 oz (59.8 kg)   ECOG FS:1 - Symptomatic but completely ambulatory  Sclerae unicteric, EOMs intact Wearing a mask No cervical or supraclavicular adenopathy Lungs no rales or rhonchi Heart regular rate and rhythm Abd soft, nontender, positive bowel sounds MSK no focal spinal tenderness, no upper extremity lymphedema Neuro: nonfocal, well oriented, appropriate affect Breasts: Status post right mastectomy, with no evidence of chest wall recurrence.  Palpation of the right-sided chest wall area does not elicit any tenderness.  The left breast is benign.  Both axillae are benign.   LAB  RESULTS:  CMP     Component Value Date/Time   NA 138 08/24/2019 1346   NA 139 07/08/2017 0942   K 5.3 (H) 08/24/2019 1346   K 3.8 07/08/2017 0942   CL 107 08/24/2019 1346   CL 109 (H) 12/31/2012 0940   CO2 21 (L) 08/24/2019 1346   CO2 18 (L) 07/08/2017 0942   GLUCOSE 125 (H) 08/24/2019 1346   GLUCOSE 156 (H) 07/08/2017 0942   GLUCOSE 127 (H) 12/31/2012 0940   BUN 25 (H) 08/24/2019 1346   BUN 17.7 07/08/2017 0942   CREATININE 1.15 (H) 08/24/2019 1346   CREATININE 1.18 (H) 12/25/2017 0959   CREATININE 1.0 07/08/2017 0942   CALCIUM 9.9 08/24/2019 1346   CALCIUM 10.4 07/08/2017 0942   PROT 8.0 08/24/2019 1346   PROT 7.7 07/08/2017 0942   ALBUMIN 3.8 08/24/2019 1346   ALBUMIN 3.4 (L) 07/08/2017 0942   AST 17 08/24/2019 1346   AST 50 (H) 12/25/2017 0959   AST 54 (H) 07/08/2017 0942   ALT 9 08/24/2019 1346   ALT 53 12/25/2017  0959   ALT 39 07/08/2017 0942   ALKPHOS 136 (H) 08/24/2019 1346   ALKPHOS 232 (H) 07/08/2017 0942   BILITOT <0.2 (L) 08/24/2019 1346   BILITOT 0.3 12/25/2017 0959   BILITOT 0.42 07/08/2017 0942   GFRNONAA 49 (L) 08/24/2019 1346   GFRNONAA 47 (L) 12/25/2017 0959   GFRAA 57 (L) 08/24/2019 1346   GFRAA 54 (L) 12/25/2017 0959    I No results found for: SPEP  Lab Results  Component Value Date   WBC 8.6 08/24/2019   NEUTROABS 6.5 08/24/2019   HGB 13.8 08/24/2019   HCT 43.4 08/24/2019   MCV 81.4 08/24/2019   PLT 358 08/24/2019      Chemistry      Component Value Date/Time   NA 138 08/24/2019 1346   NA 139 07/08/2017 0942   K 5.3 (H) 08/24/2019 1346   K 3.8 07/08/2017 0942   CL 107 08/24/2019 1346   CL 109 (H) 12/31/2012 0940   CO2 21 (L) 08/24/2019 1346   CO2 18 (L) 07/08/2017 0942   BUN 25 (H) 08/24/2019 1346   BUN 17.7 07/08/2017 0942   CREATININE 1.15 (H) 08/24/2019 1346   CREATININE 1.18 (H) 12/25/2017 0959   CREATININE 1.0 07/08/2017 0942      Component Value Date/Time   CALCIUM 9.9 08/24/2019 1346   CALCIUM 10.4 07/08/2017 0942   ALKPHOS 136 (H) 08/24/2019 1346   ALKPHOS 232 (H) 07/08/2017 0942   AST 17 08/24/2019 1346   AST 50 (H) 12/25/2017 0959   AST 54 (H) 07/08/2017 0942   ALT 9 08/24/2019 1346   ALT 53 12/25/2017 0959   ALT  39 07/08/2017 0942   BILITOT <0.2 (L) 08/24/2019 1346   BILITOT 0.3 12/25/2017 0959   BILITOT 0.42 07/08/2017 0942       Lab Results  Component Value Date   LABCA2 58 (H) 09/14/2012    No components found for: [id number]  No results for input(s): INR in the last 168 hours.  Urinalysis    Component Value Date/Time   COLORURINE YELLOW 11/25/2017 0901   APPEARANCEUR HAZY (A) 11/25/2017 0901   LABSPEC 1.017 11/25/2017 0901   LABSPEC 1.030 07/13/2015 0841   PHURINE 5.0 11/25/2017 0901   GLUCOSEU NEGATIVE 11/25/2017 0901   GLUCOSEU Negative 07/13/2015 0841   HGBUR MODERATE (A) 11/25/2017 0901   BILIRUBINUR NEGATIVE  11/25/2017 0901   BILIRUBINUR Negative 07/13/2015 0841   KETONESUR NEGATIVE 11/25/2017 0901   PROTEINUR 100 (A) 11/25/2017 0901   UROBILINOGEN 0.2 07/13/2015 0841   NITRITE NEGATIVE 11/25/2017 0901   LEUKOCYTESUR NEGATIVE 11/25/2017 0901   LEUKOCYTESUR Negative 07/13/2015 0841    STUDIES: MR LIVER W WO CONTRAST  Result Date: 08/03/2019 CLINICAL DATA:  Indeterminate liver lesion on CT. MRI recommended for further characterization. Breast cancer patient. EXAM: MRI ABDOMEN WITHOUT AND WITH CONTRAST TECHNIQUE: Multiplanar multisequence MR imaging of the abdomen was performed both before and after the administration of intravenous contrast. CONTRAST:  32m GADAVIST GADOBUTROL 1 MMOL/ML IV SOLN COMPARISON:  CT 07/09/2019 FINDINGS: Lower chest:  Lung bases are clear. Hepatobiliary: Several round lesions within the LEFT and RIGHT hepatic lobe are hyperintense on T2 weighted imaging (series 5). Example lesion measures 1.8 cm on image 17. Example lesion in the LEFT hepatic lobe along the falciform ligament measures 1.1 cm (image 13). Approximately 8 lesions in liver. These lesions demonstrate peripheral arterial enhancement (image 52/13 for example)). Pancreas: Normal pancreatic parenchymal intensity. No ductal dilatation or inflammation. Spleen: Normal spleen. Adrenals/urinary tract: Adrenal glands and kidneys are normal. Stomach/Bowel: Stomach and limited of the small bowel is unremarkable Vascular/Lymphatic: Abdominal aortic normal caliber. No retroperitoneal periportal lymphadenopathy. Musculoskeletal: Insert enhancing lesions within the lumbar spine. For example 2.3 cm lesion in the L3 vertebral body (image 60/13). Rounded 1.7 cm lesion in the L5 IMPRESSION: 1. Multiple round enhancing lesions in liver consistent with HEPATIC METASTASIS. Approximately 8 discrete lesions. 2. 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 Electronically Signed   By: SSuzy BouchardM.D.   On: 08/03/2019 14:20   [place]BIOPSY  (LIVER)  Result Date: 08/16/2019 INDICATION: 69year old with history of breast cancer and liver lesions. Request for tissue sampling. EXAM: ULTRASOUND-GUIDED LIVER LESION BIOPSY MEDICATIONS: None. ANESTHESIA/SEDATION: Moderate (conscious) sedation was employed during this procedure. A total of Versed 2.0 mg and Fentanyl 100 mcg was administered intravenously. Moderate Sedation Time: 21 minutes. The patient's level of consciousness and vital signs were monitored continuously by radiology nursing throughout the procedure under my direct supervision. FLUOROSCOPY TIME:  None COMPLICATIONS: None immediate. PROCEDURE: Informed written consent was obtained from the patient after a thorough discussion of the procedural risks, benefits and alternatives. All questions were addressed. Maximal Sterile Barrier Technique was utilized including caps, mask, sterile gowns, sterile gloves, sterile drape, hand hygiene and skin antiseptic. A timeout was performed prior to the initiation of the procedure. Liver was evaluated with ultrasound. Lesion in the left hepatic lobe was targeted. The anterior abdomen was prepped with chlorhexidine and sterile field was created. Skin and soft tissues were anesthetized with 1% lidocaine. Using ultrasound guidance, 17 gauge coaxial needle was directed into the left hepatic lesion. Total of 4 core biopsies  were obtained with an 18 gauge core device. Specimens placed in formalin. Needle was removed without complication. Bandage placed over the puncture site. FINDINGS: Two lesions identified in the medial left hepatic lobe. Core biopsies were obtained from the deeper lesion. No significant bleeding or hematoma formation following core biopsies. IMPRESSION: Ultrasound-guided core biopsy of a left hepatic lesion. Electronically Signed   By: Markus Daft M.D.   On: 08/16/2019 16:33    ASSESSMENT: 69 y.o. [place], [place] woman with stage IV breast cancer, on BOLERO-4 trial  (1) status post right  mastectomy and sentinel lymph node sampling 03/22/2004 for a right upper-outer quadrant pT2 pN1, stage IIB invasive ductal carcinoma with lobular features, grade 2, estrogen receptor and progesterone receptor positive, HER-2 negative, with an MIB-1 of 9% ([id number] and [id number])  (2) addition all right axillary lymph node sampling 05/07/2004 showed 2 benign lymph nodes (4 lymph nodes previously removed, so total was one positive lymph node out of 6; [id number])  (3) the patient was evaluated by radiation oncology; no postmastectomy radiation recommended  (4) adjuvant chemotherapy with dose dense doxorubicin and cyclophosphamide x4 cycles (first cycle delayed one week) followed by dose dense paclitaxel x4 was completed 09/18/2004  (5) tamoxifen started March 2006, discontinued 2009  METASTATIC DISEASE: February 2013 (6) presenting with a large pericardial effusion, large right pleural effusion and possible right middle lobe bronchial obstruction February 2013, status post pericardial window placement, fiberoptic bronchoscopy and right Pleurx placement 09/11/2011, with biopsy of the bronchus intermedius and pericardium positive for metastatic breast cancer, estrogen receptor 91% positive with moderate staining intensity, progesterone receptor 100% positive with strong staining intensity, with an MIB-1 of 35%, and no HER-2 amplification, the signals ratio being 1.37 (SZA [id number])  (7) enrolled in BOLERO-4 trial 10/10/2011, receiving letrozole and everolimus  (a) two small areas of enhancement in the cerebellum noted by brain MRI 10/03/2011 were no longer apparent on repeat MRI 08/21/2012-- most recent brain MRI 04/01/2014 showed no evidence of intracranial metastatic disease  (b) sclerotic lesions in left iliac bone and sacrum have not been biopsied; stable; to start zolendronate after patient updates her dental care (extraction planned)--never started  (c) RLL lung nodule, stable (rescanned 07/12/2014 and  08/11/2014) R hilar and subcarinal lymph nodes: stable  (d) CT of the chest:03/25/2018, stable  (e) CT Angio 12/12/2018 stable  (f) off BOLERO trial (trial ended) as of July 2020, but continuing on study drugs  (g) letrozole and everolimus discontinued January 2021 with disease progression  (8) additional problems:  (a) hepatic steatosis  (b) COPD/ emphysema/ asthma  (c) advanced L hip osteoarthritis, status post left total hip replacement 06/07/2015  (d) aortoiliac atherosclerosis  (e) dental evaluation pending w possible dental extractions  (f) likely thalassemia trait  (g) hyperlipidemia  (9) Bone density concerns: DEXA scan 08/11/2013 was normal  (a) repeat DEXA scan 04/09/2018 shows a T score of -1.8, osteopenia  (10) disease monitoring:  (a) PET scan 12/22/2018 shows no hypermetabolic soft tissue metastases but multifocal bony metastasis  (b) denosumab/Xgeva started on 02/25/2019, repeated every 28 days (delayed due to dental extraction healing issues)  (c) bone scan 05/13/2019 serves as baseline study with increased activity in the right scapular, lower ribs and right femoral head (stable compared to PET scan of May 2020  (d) bone scan 07/09/2019 stable  (e) CT chest 07/09/2019 shows stable lung lesions but emerging left liver lobe 0.7 cm lesion  (f) liver MRI 08/03/2019 shows approximately 8 liver lesions, the largest measuring 1.8  cm.  (g) liver biopsy 08/06/2019 confirms metastatic breast cancer, estrogen and progesterone receptor positive, with an MIB-1 of 5% and HER-2 not amplified  (11) denosumab/Xgeva begun 02/25/2019 repeated every 4 weeks  (12) fulvestrant started 08/24/2019  (a) palbociclib 125 mg daily 21 days on 7 days off started 08/25/2019     PLAN: Tatiyana remains clinically stable but she has had disease progression in the liver.  This is of concern.  It is reassuring that the tumor is still estrogen and progesterone receptor positive and that the proliferation  fraction is low.  The HER-2 was initially read as equivocal but FISH was negative  I think she has a very good chance of responding to fulvestrant and palbociclib.  She will receive her first fulvestrant dose today.  This is generally very well-tolerated except for the discomfort of the actual dose administration.  She will receive a second dose in 2 weeks and then a third dose 4 weeks from now and from that point she will receive fulvestrant every 4 weeks.  She has been able to obtain palbociclib and she will start that tomorrow.  She has a good understanding of the possible toxicities side effects and complications of this agent and was concerned regarding hair loss and extreme fatigue as well as very low counts.  I reassured her that while those things are possible most of my patients really have minimal nausea, which is improved if she takes it at supper, some fatigue which does not impede them from doing all their activities of lately daily living in her case playing golf, and then low counts which we will be monitoring every 4 weeks whenever she comes.  She is going to see me again in 1 month.  Of course we will be following her tumor markers.  If all goes well and she tolerates this treatment well the plan would be to restage her sometime in May.  She knows to call for any other issue that may develop before the next visit.  Total encounter time 35 minutes.*  She will receive her first fulvestrant today, and a second dose on 09/07/2019, and then her third dose on 09/21/2019.  From that point it will be every 28 days.  We are going to hold her denosumab/Xgeva until 09/21/2019 and then again that will be given on a monthly basis.   Virgie Dad. Demarko Zeimet, MD Medical Oncology and Hematology [facility] [place], [place] [address] Tel. [phone]    Fax. [phone]   I, Wilburn Mylar, am acting as scribe for Dr. Virgie Dad. Shaketha Jeon.  I, Lurline Del MD,  have reviewed the above documentation for accuracy and completeness, and I agree with the above.   *Total Encounter Time as defined by the Centers for Medicare and Medicaid Services includes, in addition to the face-to-face time of a patient visit (documented in the note above) non-face-to-face time: obtaining and reviewing outside history, ordering and reviewing medications, tests or procedures, care coordination (communications with other health care professionals or caregivers) and documentation in the medical record.

## 2019-08-24 ENCOUNTER — Inpatient Hospital Stay (HOSPITAL_BASED_OUTPATIENT_CLINIC_OR_DEPARTMENT_OTHER): Payer: Medicare Other | Admitting: Oncology

## 2019-08-24 ENCOUNTER — Inpatient Hospital Stay: Payer: Medicare Other

## 2019-08-24 ENCOUNTER — Other Ambulatory Visit: Payer: Self-pay

## 2019-08-24 ENCOUNTER — Telehealth: Payer: Self-pay

## 2019-08-24 VITALS — BP 113/75 | HR 78 | Temp 98.2°F | Resp 16 | Ht 64.5 in | Wt 131.9 lb

## 2019-08-24 DIAGNOSIS — C7800 Secondary malignant neoplasm of unspecified lung: Secondary | ICD-10-CM | POA: Diagnosis not present

## 2019-08-24 DIAGNOSIS — Z5111 Encounter for antineoplastic chemotherapy: Secondary | ICD-10-CM | POA: Diagnosis not present

## 2019-08-24 DIAGNOSIS — C787 Secondary malignant neoplasm of liver and intrahepatic bile duct: Secondary | ICD-10-CM

## 2019-08-24 DIAGNOSIS — C7951 Secondary malignant neoplasm of bone: Secondary | ICD-10-CM

## 2019-08-24 DIAGNOSIS — M1611 Unilateral primary osteoarthritis, right hip: Secondary | ICD-10-CM

## 2019-08-24 DIAGNOSIS — C50911 Malignant neoplasm of unspecified site of right female breast: Secondary | ICD-10-CM

## 2019-08-24 DIAGNOSIS — M8580 Other specified disorders of bone density and structure, unspecified site: Secondary | ICD-10-CM

## 2019-08-24 DIAGNOSIS — I313 Pericardial effusion (noninflammatory): Secondary | ICD-10-CM | POA: Diagnosis not present

## 2019-08-24 DIAGNOSIS — C801 Malignant (primary) neoplasm, unspecified: Secondary | ICD-10-CM | POA: Diagnosis not present

## 2019-08-24 DIAGNOSIS — Z170 Estrogen receptor positive status [ER+]: Secondary | ICD-10-CM | POA: Diagnosis not present

## 2019-08-24 DIAGNOSIS — J4540 Moderate persistent asthma, uncomplicated: Secondary | ICD-10-CM | POA: Diagnosis not present

## 2019-08-24 DIAGNOSIS — C50411 Malignant neoplasm of upper-outer quadrant of right female breast: Secondary | ICD-10-CM

## 2019-08-24 DIAGNOSIS — I700 Atherosclerosis of aorta: Secondary | ICD-10-CM

## 2019-08-24 DIAGNOSIS — I3131 Malignant pericardial effusion in diseases classified elsewhere: Secondary | ICD-10-CM

## 2019-08-24 LAB — CBC WITH DIFFERENTIAL/PLATELET
Abs Immature Granulocytes: 0.03 10*3/uL (ref 0.00–0.07)
Basophils Absolute: 0.1 10*3/uL (ref 0.0–0.1)
Basophils Relative: 1 %
Eosinophils Absolute: 0.1 10*3/uL (ref 0.0–0.5)
Eosinophils Relative: 1 %
HCT: 43.4 % (ref 36.0–46.0)
Hemoglobin: 13.8 g/dL (ref 12.0–15.0)
Immature Granulocytes: 0 %
Lymphocytes Relative: 14 %
Lymphs Abs: 1.2 10*3/uL (ref 0.7–4.0)
MCH: 25.9 pg — ABNORMAL LOW (ref 26.0–34.0)
MCHC: 31.8 g/dL (ref 30.0–36.0)
MCV: 81.4 fL (ref 80.0–100.0)
Monocytes Absolute: 0.8 10*3/uL (ref 0.1–1.0)
Monocytes Relative: 10 %
Neutro Abs: 6.5 10*3/uL (ref 1.7–7.7)
Neutrophils Relative %: 74 %
Platelets: 358 10*3/uL (ref 150–400)
RBC: 5.33 MIL/uL — ABNORMAL HIGH (ref 3.87–5.11)
RDW: 14.2 % (ref 11.5–15.5)
WBC: 8.6 10*3/uL (ref 4.0–10.5)
nRBC: 0 % (ref 0.0–0.2)

## 2019-08-24 LAB — COMPREHENSIVE METABOLIC PANEL
ALT: 9 U/L (ref 0–44)
AST: 17 U/L (ref 15–41)
Albumin: 3.8 g/dL (ref 3.5–5.0)
Alkaline Phosphatase: 136 U/L — ABNORMAL HIGH (ref 38–126)
Anion gap: 10 (ref 5–15)
BUN: 25 mg/dL — ABNORMAL HIGH (ref 8–23)
CO2: 21 mmol/L — ABNORMAL LOW (ref 22–32)
Calcium: 9.9 mg/dL (ref 8.9–10.3)
Chloride: 107 mmol/L (ref 98–111)
Creatinine, Ser: 1.15 mg/dL — ABNORMAL HIGH (ref 0.44–1.00)
GFR calc Af Amer: 57 mL/min — ABNORMAL LOW (ref >60)
GFR calc non Af Amer: 49 mL/min — ABNORMAL LOW (ref >60)
Glucose, Bld: 125 mg/dL — ABNORMAL HIGH (ref 70–99)
Potassium: 5.3 mmol/L — ABNORMAL HIGH (ref 3.5–5.1)
Sodium: 138 mmol/L (ref 135–145)
Total Bilirubin: <0.2 mg/dL — ABNORMAL LOW (ref 0.3–1.2)
Total Protein: 8 g/dL (ref 6.5–8.1)

## 2019-08-24 MED ORDER — FULVESTRANT 250 MG/5ML IM SOLN
500.0000 mg | Freq: Once | INTRAMUSCULAR | Status: AC
  Administered 2019-08-24: 500 mg via INTRAMUSCULAR

## 2019-08-24 MED ORDER — DENOSUMAB 120 MG/1.7ML ~~LOC~~ SOLN
SUBCUTANEOUS | Status: AC
  Filled 2019-08-24: qty 1.7

## 2019-08-24 MED ORDER — DENOSUMAB 120 MG/1.7ML ~~LOC~~ SOLN: 120.0000 mg | Freq: Once | SUBCUTANEOUS | Status: DC

## 2019-08-24 NOTE — Telephone Encounter (Signed)
Oral Oncology Patient Advocate Encounter  Met patient in [place] to complete application for [employer] in an effort to reduce patient's out of pocket expense for [place] to $0.    Application completed and faxed to [phone].   [employer] patient assistance phone number for follow up is [phone].   This encounter will be updated until final determination.   [place] [facility] [place] Phone [phone] Fax [phone] 08/24/2019 3:11 PM

## 2019-08-24 NOTE — Patient Instructions (Addendum)
Fulvestrant injection What is this medicine? FULVESTRANT (ful VES trant) blocks the effects of estrogen. It is used to treat breast cancer. This medicine may be used for other purposes; ask your health care provider or pharmacist if you have questions. COMMON BRAND NAME(S): FASLODEX What should I tell my health care provider before I take this medicine? They need to know if you have any of these conditions:  bleeding disorders  liver disease  low blood counts, like low white cell, platelet, or red cell counts  an unusual or allergic reaction to fulvestrant, other medicines, foods, dyes, or preservatives  pregnant or trying to get pregnant  breast-feeding How should I use this medicine? This medicine is for injection into a muscle. It is usually given by a health care professional in a hospital or clinic setting. Talk to your pediatrician regarding the use of this medicine in children. Special care may be needed. Overdosage: If you think you have taken too much of this medicine contact a poison control center or emergency room at once. NOTE: This medicine is only for you. Do not share this medicine with others. What if I miss a dose? It is important not to miss your dose. Call your doctor or health care professional if you are unable to keep an appointment. What may interact with this medicine?  medicines that treat or prevent blood clots like warfarin, enoxaparin, dalteparin, apixaban, dabigatran, and rivaroxaban This list may not describe all possible interactions. Give your health care provider a list of all the medicines, herbs, non-prescription drugs, or dietary supplements you use. Also tell them if you smoke, drink alcohol, or use illegal drugs. Some items may interact with your medicine. What should I watch for while using this medicine? Your condition will be monitored carefully while you are receiving this medicine. You will need important blood work done while you are taking  this medicine. Do not become pregnant while taking this medicine or for at least 1 year after stopping it. Women of child-bearing potential will need to have a negative pregnancy test before starting this medicine. Women should inform their doctor if they wish to become pregnant or think they might be pregnant. There is a potential for serious side effects to an unborn child. Men should inform their doctors if they wish to father a child. This medicine may lower sperm counts. Talk to your health care professional or pharmacist for more information. Do not breast-feed an infant while taking this medicine or for 1 year after the last dose. What side effects may I notice from receiving this medicine? Side effects that you should report to your doctor or health care professional as soon as possible:  allergic reactions like skin rash, itching or hives, swelling of the face, lips, or tongue  feeling faint or lightheaded, falls  pain, tingling, numbness, or weakness in the legs  signs and symptoms of infection like fever or chills; cough; flu-like symptoms; sore throat  vaginal bleeding Side effects that usually do not require medical attention (report to your doctor or health care professional if they continue or are bothersome):  aches, pains  constipation  diarrhea  headache  hot flashes  nausea, vomiting  pain at site where injected  stomach pain This list may not describe all possible side effects. Call your doctor for medical advice about side effects. You may report side effects to FDA at 1-800-FDA-1088. Where should I keep my medicine? This drug is given in a hospital or clinic and will   not be stored at home. NOTE: This sheet is a summary. It may not cover all possible information. If you have questions about this medicine, talk to your doctor, pharmacist, or health care provider.  2020 [employer] Standard (2017-10-23 11:34:41)  

## 2019-08-25 ENCOUNTER — Telehealth: Payer: Self-pay | Admitting: Oncology

## 2019-08-25 NOTE — Telephone Encounter (Signed)
I left a message regarding schedule  

## 2019-09-06 DIAGNOSIS — C787 Secondary malignant neoplasm of liver and intrahepatic bile duct: Secondary | ICD-10-CM | POA: Diagnosis not present

## 2019-09-06 DIAGNOSIS — Z170 Estrogen receptor positive status [ER+]: Secondary | ICD-10-CM | POA: Diagnosis not present

## 2019-09-06 DIAGNOSIS — C50411 Malignant neoplasm of upper-outer quadrant of right female breast: Secondary | ICD-10-CM | POA: Diagnosis not present

## 2019-09-06 DIAGNOSIS — C7951 Secondary malignant neoplasm of bone: Secondary | ICD-10-CM | POA: Diagnosis not present

## 2019-09-07 ENCOUNTER — Other Ambulatory Visit: Payer: Self-pay

## 2019-09-07 ENCOUNTER — Inpatient Hospital Stay: Payer: Medicare Other | Attending: Oncology

## 2019-09-07 VITALS — BP 118/72 | HR 72 | Temp 98.6°F | Resp 18

## 2019-09-07 DIAGNOSIS — C7951 Secondary malignant neoplasm of bone: Secondary | ICD-10-CM | POA: Diagnosis not present

## 2019-09-07 DIAGNOSIS — C50911 Malignant neoplasm of unspecified site of right female breast: Secondary | ICD-10-CM

## 2019-09-07 DIAGNOSIS — C787 Secondary malignant neoplasm of liver and intrahepatic bile duct: Secondary | ICD-10-CM | POA: Diagnosis not present

## 2019-09-07 DIAGNOSIS — C50411 Malignant neoplasm of upper-outer quadrant of right female breast: Secondary | ICD-10-CM | POA: Insufficient documentation

## 2019-09-07 DIAGNOSIS — Z5111 Encounter for antineoplastic chemotherapy: Secondary | ICD-10-CM | POA: Diagnosis not present

## 2019-09-07 MED ORDER — FULVESTRANT 250 MG/5ML IM SOLN
INTRAMUSCULAR | Status: AC
  Filled 2019-09-07: qty 10

## 2019-09-07 MED ORDER — FULVESTRANT 250 MG/5ML IM SOLN
500.0000 mg | Freq: Once | INTRAMUSCULAR | Status: AC
  Administered 2019-09-07: 500 mg via INTRAMUSCULAR

## 2019-09-07 NOTE — Patient Instructions (Signed)
Fulvestrant injection What is this medicine? FULVESTRANT (ful VES trant) blocks the effects of estrogen. It is used to treat breast cancer. This medicine may be used for other purposes; ask your health care provider or pharmacist if you have questions. COMMON BRAND NAME(S): FASLODEX What should I tell my health care provider before I take this medicine? They need to know if you have any of these conditions:  bleeding disorders  liver disease  low blood counts, like low white cell, platelet, or red cell counts  an unusual or allergic reaction to fulvestrant, other medicines, foods, dyes, or preservatives  pregnant or trying to get pregnant  breast-feeding How should I use this medicine? This medicine is for injection into a muscle. It is usually given by a health care professional in a hospital or clinic setting. Talk to your pediatrician regarding the use of this medicine in children. Special care may be needed. Overdosage: If you think you have taken too much of this medicine contact a poison control center or emergency room at once. NOTE: This medicine is only for you. Do not share this medicine with others. What if I miss a dose? It is important not to miss your dose. Call your doctor or health care professional if you are unable to keep an appointment. What may interact with this medicine?  medicines that treat or prevent blood clots like warfarin, enoxaparin, dalteparin, apixaban, dabigatran, and rivaroxaban This list may not describe all possible interactions. Give your health care provider a list of all the medicines, herbs, non-prescription drugs, or dietary supplements you use. Also tell them if you smoke, drink alcohol, or use illegal drugs. Some items may interact with your medicine. What should I watch for while using this medicine? Your condition will be monitored carefully while you are receiving this medicine. You will need important blood work done while you are taking  this medicine. Do not become pregnant while taking this medicine or for at least 1 year after stopping it. Women of child-bearing potential will need to have a negative pregnancy test before starting this medicine. Women should inform their doctor if they wish to become pregnant or think they might be pregnant. There is a potential for serious side effects to an unborn child. Men should inform their doctors if they wish to father a child. This medicine may lower sperm counts. Talk to your health care professional or pharmacist for more information. Do not breast-feed an infant while taking this medicine or for 1 year after the last dose. What side effects may I notice from receiving this medicine? Side effects that you should report to your doctor or health care professional as soon as possible:  allergic reactions like skin rash, itching or hives, swelling of the face, lips, or tongue  feeling faint or lightheaded, falls  pain, tingling, numbness, or weakness in the legs  signs and symptoms of infection like fever or chills; cough; flu-like symptoms; sore throat  vaginal bleeding Side effects that usually do not require medical attention (report to your doctor or health care professional if they continue or are bothersome):  aches, pains  constipation  diarrhea  headache  hot flashes  nausea, vomiting  pain at site where injected  stomach pain This list may not describe all possible side effects. Call your doctor for medical advice about side effects. You may report side effects to FDA at 1-800-FDA-1088. Where should I keep my medicine? This drug is given in a hospital or clinic and will   not be stored at home. NOTE: This sheet is a summary. It may not cover all possible information. If you have questions about this medicine, talk to your doctor, pharmacist, or health care provider.  2020 [employer]/Gold Standard (2017-10-23 11:34:41)  

## 2019-09-13 ENCOUNTER — Other Ambulatory Visit: Payer: Self-pay | Admitting: Oncology

## 2019-09-13 NOTE — Progress Notes (Signed)
[place]  [phone] [phone]    ID: Jessina Marse [place]assa DOB: [birth year]  MR#: [id number]  VHO#:[id number]  Patient Care Team: [name], MD as PCP - General (Family Medicine) Jerline Pain, MD (Cardiology) Gaye Pollack, MD (Cardiothoracic Surgery) Nur Rabold, Virgie Dad, MD as Consulting Physician (Oncology) OTHER MD: Dr Erroll Luna- Merlene Laughter, DDS; Gaynelle Arabian MD   CHIEF COMPLAINT: metastatic breast cancer, formerly on BOLERO study  CURRENT TREATMENT: Fulvestrant, palbociclib, denosumab/Xgeva   INTERVAL HISTORY: Arkie returns today for follow-up and treatment of her metastatic breast cancer.  Since her last visit, she underwent liver biopsy on 08/16/2019 of one of the lesions seen on recent MRI. Pathology from the procedure ([id number]) revealed: metastatic carcinoma to liver. Prognostic indicators significant for: estrogen receptor, 100% positive with moderate staining intensity and progesterone receptor, 100% positive with strong staining intensity. Proliferation marker Ki67 at 5%. HER2 equivocal by immunohistochemistry (2+), but negative by fluorescent in situ hybridization with a signals ratio 1.22 and number per cell 1.95.  Given the disease progression and since her liver biopsy confirmed her cancer is still estrogen receptor positive, we are switching [name] to fulvestrant and palbociclib. She will be started on the fulvestrant today and the palbociclib tomorrow.  Anelly also receives [place] every 4 weeks, started July 2020.  Her most recent dose was earlier this month and her next dose will be 09/21/2019  We are following her CA 27-29, which had been rising. Lab Results  Component Value Date   CA2729 396.0 (H) 08/09/2019   CA2729 429.1 (H) 06/14/2019   CA2729 310.1 (H) 04/22/2019   CA2729 264.8 (H) 02/25/2019   CA2729 224.5 (H) 01/04/2019    REVIEW OF SYSTEMS: Jasmarie did well with her liver biopsy.  The worst part was lying  there for 6 hours post biopsy she says.  She still had some discomfort afterwards, and yesterday she developed some pain in the right upper lateral chest which was not related to breathing or position and which may relate to the biopsy as well.  Possibly she had a small bleed.  For exercise she continues to do walking.  She tells me she has a good appetite and a good sense of taste no change in weight no nausea or vomiting and no change in bowel or bladder habits.  Detailed review of systems was otherwise stable.    BREAST CANCER HISTORY: From Dr. Collier [name]'s original intake note 04/13/2004:  "This woman has been in good health all of her life. She recently moved from [place] to work here.  She palpated a mass at about the 12 o'clock position in mid-July. She has not noticed any nipple retraction or skin changes.  She was seen by her primary care doctor who subsequently referred her for a mammogram.  Mammogram was performed on 03/01/04. This demonstrated a spiculated 2 cm mass at the 12 o'clock position in the right breast.  Upper outer quadrant of the left breast shows some distortion.   Physical exam at that time showed a firm, nontender nodule at the 12 o'clock position in the right breast, 5 cm from the nipple.  Ultrasound of this area showed a hypoechoic ill-defined mass, measuring 2.2 x 1.3 x 1.4 cm.  Physical exam of the left breast showed general vague thickening, upper outer quadrant of the left breast with a discrete palpable mass. The ultrasound performed showed a single hypoechoic ill-defined nodule at the 1 o'clock position, measuring 7 x 5 x 6 mm.  She had biopsies of both lesions on 03/02/03.  Needle core biopsy of the lesion on the right breast revealed invasive mammary carcinoma.  Needle core biopsy of the left breast showed a complex fibroadenoma.  Prognostic panel of the lesion on the right breast showed it to be ER positive at 73%, PR positive at 90% and proliferative index 9%, HER-2  was 1+.  Patient was referred to Dr. Annamaria Boots, who performed a simple mastectomy with sentinel lymph node evaluation on 03/22/04.  Final pathology showed this to be an invasive ductal carcinoma  with lobular features, measuring 2.2 cm, grade 2 of 3.  Margins negative for carcinoma.  Invasive ductal carcinoma was extended to involve deep dermis of the nipple.  Lymphovascular invasion was identified.  Total of 4 sentinel lymph nodes were evaluated.  Touch imprints at the time of the OR was felt to be negative.  Subsequent evaluation showed a 5 mm focus of metastatic carcinoma in one of the four lymph nodes on microscopic after sectioning.  There was extracapsular extension  of one lymph node as well. The remaining three lymph nodes were all negative."  The patient's subsequent history is detailed above.   PAST MEDICAL HISTORY: Past Medical History:  Diagnosis Date  . Arthritis    left hip  . Asthma   . breast ca 2005   breast/chemo R mastectomy  . Dermatitis   . Diabetes mellitus without complication ([place])   . GERD (gastroesophageal reflux disease)   . Hypercholesteremia   . Metastasis to lung ([place]) dx'd 08/2011  . Osteopenia due to cancer therapy 09/09/2013  . Palpitations   . Personal history of chemotherapy   . Shortness of breath     PAST SURGICAL HISTORY: Past Surgical History:  Procedure Laterality Date  . BREAST SURGERY  2005   right  . CATARACT EXTRACTION W/PHACO Left 01/18/2014   Procedure: CATARACT EXTRACTION PHACO AND INTRAOCULAR LENS PLACEMENT (IOC);  Surgeon: Elta Guadeloupe T. Gershon Crane, MD;  Location: [facility];  Service: Ophthalmology;  Laterality: Left;  CDE 15.79  . CATARACT EXTRACTION W/PHACO Right 02/08/2014   Procedure: CATARACT EXTRACTION PHACO AND INTRAOCULAR LENS PLACEMENT (IOC);  Surgeon: Elta Guadeloupe T. Gershon Crane, MD;  Location: [facility];  Service: Ophthalmology;  Laterality: Right;  CDE 4.41  . CHEST TUBE INSERTION  09/11/2011   Procedure: INSERTION PLEURAL DRAINAGE CATHETER;  Surgeon: Gaye Pollack, MD;  Location: [facility];  Service: Thoracic;  Laterality: Right;  . MASTECTOMY Right   . PERICARDIAL WINDOW  09/11/2011   Procedure: PERICARDIAL WINDOW;  Surgeon: Gaye Pollack, MD;  Location[facility];  Service: Thoracic;  Laterality: N/A;  . REMOVAL OF PLEURAL DRAINAGE CATHETER  12/19/2011   Procedure: REMOVAL OF PLEURAL DRAINAGE CATHETER;  Surgeon: Gaye Pollack, MD;  Location: [facility];  Service: Thoracic;  Laterality: Right;  TO BE DONE IN MINOR ROOM, SHORT STAY  . [place]  . TOTAL HIP ARTHROPLASTY Left 06/07/2015   Procedure: LEFT TOTAL HIP ARTHROPLASTY ANTERIOR APPROACH;  Surgeon: Gaynelle Arabian, MD;  Location: [facility];  Service: Orthopedics;  Laterality: Left;  [place] VIDEO BRONCHOSCOPY  09/11/2011   Procedure: VIDEO BRONCHOSCOPY;  Surgeon: Gaye Pollack, MD;  Location: [facility] OR;  Service: Thoracic;  Laterality: N/A;    FAMILY HISTORY Family History  Problem Relation Age of Onset  . Cancer Mother        breast  . Heart disease Father   . Diabetes Other   . Anesthesia problems Neg Hx  The patient's mother was diagnosed with breast cancer at age 30, she died age 34 from congestive heart failure.The patient's father died from heart disease at age 72.  She has one sister alive & well.  Two brothers alive & well, one with diabetes. The patient's sister was also diagnosed with breast cancer, and was tested for the BRCA gene, and was negative. The patient herself has not been tested. There is no history of ovarian cancer in the family   GYNECOLOGIC HISTORY:  No LMP recorded. Patient is postmenopausal. Menarche age 56, the patient is GX P0. She stopped having periods with chemotherapy in 2005. 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 She was a Archivist. She is single, lives alone with her Shitzu-poodle Sammie.. Family is all in the [place] area.     ADVANCED  DIRECTIVES: Not in place. At the 02/23/2014 visit the patient was given the appropriate documents to complete and notarize at her discretion. She tells me she is planning to name her sister, Billie Ruddy, as healthcare power of attorney. [name] can be reached at [phone]   HEALTH MAINTENANCE: Social History   Tobacco Use  . Smoking status: Former Smoker    Packs/day: 1.50    Years: 30.00    Pack years: 45.00    Types: Cigarettes    Quit date: 09/10/2007    Years since quitting: 12.0  . Smokeless tobacco: Never Used  Substance Use Topics  . Alcohol use: No  . Drug use: No    Colonoscopy:  PAP:  Bone density: 04/09/2018, T score -1.8  Lipid panel:  Allergies  Allergen Reactions  . Aspirin     REACTION: upset stomach  . Latex Other (See Comments)    Blistering and skin peels off  . Nsaids Nausea And Vomiting    Extreme nausea and vomiting    Current Outpatient Medications  Medication Sig Dispense Refill  . cholecalciferol (VITAMIN D3) 25 MCG (1000 UT) tablet Take 2 tablets (2,000 Units total) by mouth daily. 180 tablet 4  . cyclobenzaprine (FLEXERIL) 10 MG tablet Take 1 tablet (10 mg total) by mouth 2 (two) times daily as needed for muscle spasms. 20 tablet 0  . gemfibrozil (LOPID) 600 MG tablet TAKE 1 TABLET (600 MG TOTAL) BY MOUTH 2 (TWO) TIMES DAILY BEFORE A MEAL. 180 tablet 1  . loperamide (IMODIUM) 2 MG capsule Take 2 mg by mouth 4 (four) times daily as needed for diarrhea or loose stools. Reported on 11/30/2015    . metFORMIN (GLUCOPHAGE-XR) 500 MG 24 hr tablet Take 1 tablet (500 mg total) by mouth 3 (three) times daily before meals.  3  . palbociclib (IBRANCE) 125 MG tablet Take 1 tablet (125 mg total) by mouth daily. Take for 21 days on, 7 days off, repeat every 28 days. 21 tablet 6  . traMADol (ULTRAM) 50 MG tablet Take 0.5-1 tablets (25-50 mg total) by mouth every 6 (six) hours as needed. 30 tablet 0   No current facility-administered medications for this visit.      OBJECTIVE: Middle-aged white woman in no acute distress  There were no vitals filed for this visit.   There is no height or weight on file to calculate BMI.   There were no vitals filed for this visit. ECOG FS:1 - Symptomatic but completely ambulatory  Sclerae unicteric, EOMs intact Wearing a mask No  cervical or supraclavicular adenopathy Lungs no rales or rhonchi Heart regular rate and rhythm Abd soft, nontender, positive bowel sounds MSK no focal spinal tenderness, no upper extremity lymphedema Neuro: nonfocal, well oriented, appropriate affect Breasts: Status post right mastectomy, with no evidence of chest wall recurrence.  Palpation of the right-sided chest wall area does not elicit any tenderness.  The left breast is benign.  Both axillae are benign.   LAB  RESULTS:  CMP     Component Value Date/Time   NA 138 08/24/2019 1346   NA 139 07/08/2017 0942   K 5.3 (H) 08/24/2019 1346   K 3.8 07/08/2017 0942   CL 107 08/24/2019 1346   CL 109 (H) 12/31/2012 0940   CO2 21 (L) 08/24/2019 1346   CO2 18 (L) 07/08/2017 0942   GLUCOSE 125 (H) 08/24/2019 1346   GLUCOSE 156 (H) 07/08/2017 0942   GLUCOSE 127 (H) 12/31/2012 0940   BUN 25 (H) 08/24/2019 1346   BUN 17.7 07/08/2017 0942   CREATININE 1.15 (H) 08/24/2019 1346   CREATININE 1.18 (H) 12/25/2017 0959   CREATININE 1.0 07/08/2017 0942   CALCIUM 9.9 08/24/2019 1346   CALCIUM 10.4 07/08/2017 0942   PROT 8.0 08/24/2019 1346   PROT 7.7 07/08/2017 0942   ALBUMIN 3.8 08/24/2019 1346   ALBUMIN 3.4 (L) 07/08/2017 0942   AST 17 08/24/2019 1346   AST 50 (H) 12/25/2017 0959   AST 54 (H) 07/08/2017 0942   ALT 9 08/24/2019 1346   ALT 53 12/25/2017 0959   ALT 39 07/08/2017 0942   ALKPHOS 136 (H) 08/24/2019 1346   ALKPHOS 232 (H) 07/08/2017 0942   BILITOT <0.2 (L) 08/24/2019 1346   BILITOT 0.3 12/25/2017 0959   BILITOT 0.42 07/08/2017 0942   GFRNONAA 49 (L) 08/24/2019 1346   GFRNONAA 47 (L) 12/25/2017 0959   GFRAA 57 (L)  08/24/2019 1346   GFRAA 54 (L) 12/25/2017 0959    I No results found for: SPEP  Lab Results  Component Value Date   WBC 8.6 08/24/2019   NEUTROABS 6.5 08/24/2019   HGB 13.8 08/24/2019   HCT 43.4 08/24/2019   MCV 81.4 08/24/2019   PLT 358 08/24/2019      Chemistry      Component Value Date/Time   NA 138 08/24/2019 1346   NA 139 07/08/2017 0942   K 5.3 (H) 08/24/2019 1346   K 3.8 07/08/2017 0942   CL 107 08/24/2019 1346   CL 109 (H) 12/31/2012 0940   CO2 21 (L) 08/24/2019 1346   CO2 18 (L) 07/08/2017 0942   BUN 25 (H) 08/24/2019 1346   BUN 17.7 07/08/2017 0942   CREATININE 1.15 (H) 08/24/2019 1346   CREATININE 1.18 (H) 12/25/2017 0959   CREATININE 1.0 07/08/2017 0942      Component Value Date/Time   CALCIUM 9.9 08/24/2019 1346   CALCIUM 10.4 07/08/2017 0942   ALKPHOS 136 (H) 08/24/2019 1346   ALKPHOS 232 (H) 07/08/2017 0942   AST 17 08/24/2019 1346   AST 50 (H) 12/25/2017 0959   AST 54 (H) 07/08/2017 0942   ALT 9 08/24/2019 1346   ALT 53 12/25/2017 0959   ALT 39 07/08/2017 0942   BILITOT <0.2 (L) 08/24/2019 1346   BILITOT 0.3 12/25/2017 0959   BILITOT 0.42 07/08/2017 0942       Lab Results  Component Value Date   LABCA2 58 (H) 09/14/2012    No components found for: [id number]  No results for input(s): INR in the last 168  hours.  Urinalysis    Component Value Date/Time   COLORURINE YELLOW 11/25/2017 0901   APPEARANCEUR HAZY (A) 11/25/2017 0901   LABSPEC 1.017 11/25/2017 0901   LABSPEC 1.030 07/13/2015 0841   PHURINE 5.0 11/25/2017 0901   GLUCOSEU NEGATIVE 11/25/2017 0901   GLUCOSEU Negative 07/13/2015 0841   HGBUR MODERATE (A) 11/25/2017 0901   BILIRUBINUR NEGATIVE 11/25/2017 0901   BILIRUBINUR Negative 07/13/2015 0841   KETONESUR NEGATIVE 11/25/2017 0901   PROTEINUR 100 (A) 11/25/2017 0901   UROBILINOGEN 0.2 07/13/2015 0841   NITRITE NEGATIVE 11/25/2017 0901   LEUKOCYTESUR NEGATIVE 11/25/2017 0901   LEUKOCYTESUR Negative 07/13/2015 0841     STUDIES: US BIOPSY (LIVER)  Result Date: 08/16/2019 INDICATION: 69 year old with history of breast cancer and liver lesions. Request for tissue sampling. EXAM: ULTRASOUND-GUIDED LIVER LESION BIOPSY MEDICATIONS: None. ANESTHESIA/SEDATION: Moderate (conscious) sedation was employed during this procedure. A total of Versed 2.0 mg and Fentanyl 100 mcg was administered intravenously. Moderate Sedation Time: 21 minutes. The patient's level of consciousness and vital signs were monitored continuously by radiology nursing throughout the procedure under my direct supervision. FLUOROSCOPY TIME:  None COMPLICATIONS: None immediate. PROCEDURE: Informed written consent was obtained from the patient after a thorough discussion of the procedural risks, benefits and alternatives. All questions were addressed. Maximal Sterile Barrier Technique was utilized including caps, mask, sterile gowns, sterile gloves, sterile drape, hand hygiene and skin antiseptic. A timeout was performed prior to the initiation of the procedure. Liver was evaluated with ultrasound. Lesion in the left hepatic lobe was targeted. The anterior abdomen was prepped with chlorhexidine and sterile field was created. Skin and soft tissues were anesthetized with 1% lidocaine. Using ultrasound guidance, 17 gauge coaxial needle was directed into the left hepatic lesion. Total of 4 core biopsies were obtained with an 18 gauge core device. Specimens placed in formalin. Needle was removed without complication. Bandage placed over the puncture site. FINDINGS: Two lesions identified in the medial left hepatic lobe. Core biopsies were obtained from the deeper lesion. No significant bleeding or hematoma formation following core biopsies. IMPRESSION: Ultrasound-guided core biopsy of a left hepatic lesion. Electronically Signed   By: Markus Daft M.D.   On: 08/16/2019 16:33    ASSESSMENT: 69 y.o. [place], [place] woman with stage IV breast cancer, on BOLERO-4 trial  (1)  status post right mastectomy and sentinel lymph node sampling 03/22/2004 for a right upper-outer quadrant pT2 pN1, stage IIB invasive ductal carcinoma with lobular features, grade 2, estrogen receptor and progesterone receptor positive, HER-2 negative, with an MIB-1 of 9% ([id number] and [id number])  (2) addition all right axillary lymph node sampling 05/07/2004 showed 2 benign lymph nodes (4 lymph nodes previously removed, so total was one positive lymph node out of 6; [id number])  (3) the patient was evaluated by radiation oncology; no postmastectomy radiation recommended  (4) adjuvant chemotherapy with dose dense doxorubicin and cyclophosphamide x4 cycles (first cycle delayed one week) followed by dose dense paclitaxel x4 was completed 09/18/2004  (5) tamoxifen started March 2006, discontinued 2009  METASTATIC DISEASE: February 2013 (6) presenting with a large pericardial effusion, large right pleural effusion and possible right middle lobe bronchial obstruction February 2013, status post pericardial window placement, fiberoptic bronchoscopy and right Pleurx placement 09/11/2011, with biopsy of the bronchus intermedius and pericardium positive for metastatic breast cancer, estrogen receptor 91% positive with moderate staining intensity, progesterone receptor 100% positive with strong staining intensity, with an MIB-1 of 35%, and no HER-2 amplification, the signals ratio being 1.37 (SZA [id number])  (  7) enrolled in BOLERO-4 trial 10/10/2011, receiving letrozole and everolimus  (a) two small areas of enhancement in the cerebellum noted by brain MRI 10/03/2011 were no longer apparent on repeat MRI 08/21/2012-- most recent brain MRI 04/01/2014 showed no evidence of intracranial metastatic disease  (b) sclerotic lesions in left iliac bone and sacrum have not been biopsied; stable; to start zolendronate after patient updates her dental care (extraction planned)--never started  (c) RLL lung nodule, stable  (rescanned 07/12/2014 and 08/11/2014) R hilar and subcarinal lymph nodes: stable  (d) CT of the chest:03/25/2018, stable  (e) CT Angio 12/12/2018 stable  (f) off BOLERO trial (trial ended) as of July 2020, but continuing on study drugs  (g) letrozole and everolimus discontinued January 2021 with disease progression  (8) additional problems:  (a) hepatic steatosis  (b) COPD/ emphysema/ asthma  (c) advanced L hip osteoarthritis, status post left total hip replacement 06/07/2015  (d) aortoiliac atherosclerosis  (e) dental evaluation pending w possible dental extractions  (f) likely thalassemia trait  (g) hyperlipidemia  (9) Bone density concerns: DEXA scan 08/11/2013 was normal  (a) repeat DEXA scan 04/09/2018 shows a T score of -1.8, osteopenia  (10) disease monitoring:  (a) PET scan 12/22/2018 shows no hypermetabolic soft tissue metastases but multifocal bony metastasis  (b) denosumab/Xgeva started on 02/25/2019, repeated every 28 days (delayed due to dental extraction healing issues)  (c) bone scan 05/13/2019 serves as baseline study with increased activity in the right scapular, lower ribs and right femoral head (stable compared to PET scan of May 2020  (d) bone scan 07/09/2019 stable  (e) CT chest 07/09/2019 shows stable lung lesions but emerging left liver lobe 0.7 cm lesion  (f) liver MRI 08/03/2019 shows approximately 8 liver lesions, the largest measuring 1.8 cm.  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  This is of concern.  It is reassuring that the tumor is still estrogen and progesterone receptor positive and that the proliferation fraction is low.  The HER-2 was initially read as equivocal but FISH was negative  I think she has a very good chance of responding to fulvestrant and palbociclib.  She will receive her first fulvestrant dose today.  This is generally very well-tolerated except for the discomfort of the actual dose administration.  She will receive a second dose in 2 weeks and then a third dose 4 weeks from now and from that point she will receive fulvestrant every 4 weeks.  She has been able to obtain palbociclib and she will start that tomorrow.  She has a good understanding of the possible toxicities side effects and complications of this agent and was concerned regarding hair loss and extreme fatigue as well as very low counts.  I reassured her that while those things are possible most of  my patients really have minimal nausea, which is improved if she takes it at supper, some fatigue which does not impede them from doing all their activities of lately daily living in her case playing golf, and then low counts which we will be monitoring every 4 weeks whenever she comes.  She is going to see me again in 1 month.  Of course we will be following her tumor markers.  If all goes well and she tolerates this treatment well the plan would be to restage her sometime in May.  She knows to call for any other issue that may develop before the next visit.  Total encounter time 35 minutes.*  She will receive her first fulvestrant today, and a second dose on 09/07/2019, and then her  third dose on 09/21/2019.  From that point it will be every 28 days.  We are going to hold her denosumab/Xgeva until 09/21/2019 and then again that will be given on a monthly basis.   Virgie Dad. Farrel Guimond, MD Medical Oncology and Hematology [facility] [place], [place] [address] Tel. [phone]    Fax. [phone]   I, Wilburn Mylar, am acting as scribe for Dr. Virgie Dad. Kennith Morss.  I, Lurline Del MD, have reviewed the above documentation for accuracy and completeness, and I agree with the above.   *Total Encounter Time as defined by the Centers for Medicare and Medicaid Services includes, in addition to the face-to-face time of a patient visit (documented in the note above) non-face-to-face time: obtaining and reviewing outside history, ordering and reviewing medications, tests or procedures, care coordination (communications with other health care professionals or caregivers) and documentation in the medical record.

## 2019-09-17 ENCOUNTER — Telehealth: Payer: Self-pay | Admitting: *Deleted

## 2019-09-17 NOTE — Telephone Encounter (Signed)
This RN returned VM left by pt who requested to speak to Barbara Parks ( call came in late pm and was transferred by switchboard).  Barbara Parks wanted to let Barbara Parks know she has not yet been contacted by [employer] regarding getting her next shipment of [place].  She is to start her next cycle 2/24.  This RN informed pt above information will be forwarded to [facility] in [place].

## 2019-09-20 NOTE — Telephone Encounter (Signed)
Patient is approved for assistance through [employer] for [facility]  09/08/19-09/17/20  [place] phone number [place] Patient [place] Phone [phone] Fax [phone] 09/20/2019 8:36 AM

## 2019-09-20 NOTE — Telephone Encounter (Signed)
I called [employer] this morning. Representative stated they had tried to reach patient with no answer and no call back. I also attempted to reach patient while [employer] was on the line, no answer. I left a message for patient to call me back. She needs to call [employer] to set up shipment [phone].

## 2019-09-21 ENCOUNTER — Inpatient Hospital Stay (HOSPITAL_BASED_OUTPATIENT_CLINIC_OR_DEPARTMENT_OTHER): Payer: Medicare Other | Admitting: Adult Health

## 2019-09-21 ENCOUNTER — Encounter: Payer: Self-pay | Admitting: Adult Health

## 2019-09-21 ENCOUNTER — Inpatient Hospital Stay: Payer: Medicare Other

## 2019-09-21 ENCOUNTER — Other Ambulatory Visit: Payer: Self-pay

## 2019-09-21 VITALS — BP 105/64 | HR 95 | Temp 98.9°F | Resp 18 | Ht 64.5 in | Wt 136.5 lb

## 2019-09-21 DIAGNOSIS — C7951 Secondary malignant neoplasm of bone: Secondary | ICD-10-CM

## 2019-09-21 DIAGNOSIS — C50411 Malignant neoplasm of upper-outer quadrant of right female breast: Secondary | ICD-10-CM

## 2019-09-21 DIAGNOSIS — C50911 Malignant neoplasm of unspecified site of right female breast: Secondary | ICD-10-CM

## 2019-09-21 DIAGNOSIS — Z5111 Encounter for antineoplastic chemotherapy: Secondary | ICD-10-CM | POA: Diagnosis not present

## 2019-09-21 DIAGNOSIS — C787 Secondary malignant neoplasm of liver and intrahepatic bile duct: Secondary | ICD-10-CM

## 2019-09-21 DIAGNOSIS — C7800 Secondary malignant neoplasm of unspecified lung: Secondary | ICD-10-CM

## 2019-09-21 DIAGNOSIS — C801 Malignant (primary) neoplasm, unspecified: Secondary | ICD-10-CM

## 2019-09-21 DIAGNOSIS — I3131 Malignant pericardial effusion in diseases classified elsewhere: Secondary | ICD-10-CM

## 2019-09-21 LAB — COMPREHENSIVE METABOLIC PANEL
ALT: 13 U/L (ref 0–44)
AST: 14 U/L — ABNORMAL LOW (ref 15–41)
Albumin: 3.6 g/dL (ref 3.5–5.0)
Alkaline Phosphatase: 94 U/L (ref 38–126)
Anion gap: 8 (ref 5–15)
BUN: 24 mg/dL — ABNORMAL HIGH (ref 8–23)
CO2: 22 mmol/L (ref 22–32)
Calcium: 9.7 mg/dL (ref 8.9–10.3)
Chloride: 109 mmol/L (ref 98–111)
Creatinine, Ser: 1.05 mg/dL — ABNORMAL HIGH (ref 0.44–1.00)
GFR calc Af Amer: >60 mL/min (ref >60)
GFR calc non Af Amer: 55 mL/min — ABNORMAL LOW (ref >60)
Glucose, Bld: 102 mg/dL — ABNORMAL HIGH (ref 70–99)
Potassium: 5.1 mmol/L (ref 3.5–5.1)
Sodium: 139 mmol/L (ref 135–145)
Total Bilirubin: 0.2 mg/dL — ABNORMAL LOW (ref 0.3–1.2)
Total Protein: 7.3 g/dL (ref 6.5–8.1)

## 2019-09-21 LAB — CBC WITH DIFFERENTIAL/PLATELET
Abs Immature Granulocytes: 0.01 10*3/uL (ref 0.00–0.07)
Basophils Absolute: 0.1 10*3/uL (ref 0.0–0.1)
Basophils Relative: 2 %
Eosinophils Absolute: 0 10*3/uL (ref 0.0–0.5)
Eosinophils Relative: 0 %
HCT: 40.9 % (ref 36.0–46.0)
Hemoglobin: 13.5 g/dL (ref 12.0–15.0)
Immature Granulocytes: 0 %
Lymphocytes Relative: 19 %
Lymphs Abs: 0.9 10*3/uL (ref 0.7–4.0)
MCH: 28.9 pg (ref 26.0–34.0)
MCHC: 33 g/dL (ref 30.0–36.0)
MCV: 87.6 fL (ref 80.0–100.0)
Monocytes Absolute: 0.7 10*3/uL (ref 0.1–1.0)
Monocytes Relative: 15 %
Neutro Abs: 3.2 10*3/uL (ref 1.7–7.7)
Neutrophils Relative %: 64 %
Platelets: 196 10*3/uL (ref 150–400)
RBC: 4.67 MIL/uL (ref 3.87–5.11)
RDW: 21.2 % — ABNORMAL HIGH (ref 11.5–15.5)
WBC: 4.9 10*3/uL (ref 4.0–10.5)
nRBC: 0 % (ref 0.0–0.2)

## 2019-09-21 MED ORDER — FULVESTRANT 250 MG/5ML IM SOLN
INTRAMUSCULAR | Status: AC
  Filled 2019-09-21: qty 10

## 2019-09-21 MED ORDER — DENOSUMAB 120 MG/1.7ML ~~LOC~~ SOLN
120.0000 mg | Freq: Once | SUBCUTANEOUS | Status: AC
  Administered 2019-09-21: 15:00:00 120 mg via SUBCUTANEOUS

## 2019-09-21 MED ORDER — FULVESTRANT 250 MG/5ML IM SOLN
500.0000 mg | Freq: Once | INTRAMUSCULAR | Status: AC
  Administered 2019-09-21: 500 mg via INTRAMUSCULAR

## 2019-09-21 MED ORDER — DENOSUMAB 120 MG/1.7ML ~~LOC~~ SOLN
SUBCUTANEOUS | Status: AC
  Filled 2019-09-21: qty 1.7

## 2019-09-21 NOTE — Patient Instructions (Signed)
Fulvestrant injection What is this medicine? FULVESTRANT (ful VES trant) blocks the effects of estrogen. It is used to treat breast cancer. This medicine may be used for other purposes; ask your health care provider or pharmacist if you have questions. COMMON BRAND NAME(S): FASLODEX What should I tell my health care provider before I take this medicine? They need to know if you have any of these conditions:  bleeding disorders  liver disease  low blood counts, like low white cell, platelet, or red cell counts  an unusual or allergic reaction to fulvestrant, other medicines, foods, dyes, or preservatives  pregnant or trying to get pregnant  breast-feeding How should I use this medicine? This medicine is for injection into a muscle. It is usually given by a health care professional in a hospital or clinic setting. Talk to your pediatrician regarding the use of this medicine in children. Special care may be needed. Overdosage: If you think you have taken too much of this medicine contact a poison control center or emergency room at once. NOTE: This medicine is only for you. Do not share this medicine with others. What if I miss a dose? It is important not to miss your dose. Call your doctor or health care professional if you are unable to keep an appointment. What may interact with this medicine?  medicines that treat or prevent blood clots like warfarin, enoxaparin, dalteparin, apixaban, dabigatran, and rivaroxaban This list may not describe all possible interactions. Give your health care provider a list of all the medicines, herbs, non-prescription drugs, or dietary supplements you use. Also tell them if you smoke, drink alcohol, or use illegal drugs. Some items may interact with your medicine. What should I watch for while using this medicine? Your condition will be monitored carefully while you are receiving this medicine. You will need important blood work done while you are taking  this medicine. Do not become pregnant while taking this medicine or for at least 1 year after stopping it. Women of child-bearing potential will need to have a negative pregnancy test before starting this medicine. Women should inform their doctor if they wish to become pregnant or think they might be pregnant. There is a potential for serious side effects to an unborn child. Men should inform their doctors if they wish to father a child. This medicine may lower sperm counts. Talk to your health care professional or pharmacist for more information. Do not breast-feed an infant while taking this medicine or for 1 year after the last dose. What side effects may I notice from receiving this medicine? Side effects that you should report to your doctor or health care professional as soon as possible:  allergic reactions like skin rash, itching or hives, swelling of the face, lips, or tongue  feeling faint or lightheaded, falls  pain, tingling, numbness, or weakness in the legs  signs and symptoms of infection like fever or chills; cough; flu-like symptoms; sore throat  vaginal bleeding Side effects that usually do not require medical attention (report to your doctor or health care professional if they continue or are bothersome):  aches, pains  constipation  diarrhea  headache  hot flashes  nausea, vomiting  pain at site where injected  stomach pain This list may not describe all possible side effects. Call your doctor for medical advice about side effects. You may report side effects to FDA at 1-800-FDA-1088. Where should I keep my medicine? This drug is given in a hospital or clinic and will  not be stored at home. NOTE: This sheet is a summary. It may not cover all possible information. If you have questions about this medicine, talk to your doctor, pharmacist, or health care provider.  2020 [employer] Standard (2017-10-23 11:34:41) Denosumab injection What is this  medicine? DENOSUMAB (den oh sue mab) slows bone breakdown. Prolia is used to treat osteoporosis in women after menopause and in men, and in people who are taking corticosteroids for 6 months or more. Delton See is used to treat a high calcium level due to cancer and to prevent bone fractures and other bone problems caused by multiple myeloma or cancer bone metastases. Delton See is also used to treat giant cell tumor of the bone. This medicine may be used for other purposes; ask your health care provider or pharmacist if you have questions. COMMON BRAND NAME(S): Prolia, XGEVA What should I tell my health care provider before I take this medicine? They need to know if you have any of these conditions:  dental disease  having surgery or tooth extraction  infection  kidney disease  low levels of calcium or Vitamin D in the blood  malnutrition  on hemodialysis  skin conditions or sensitivity  thyroid or parathyroid disease  an unusual reaction to denosumab, other medicines, foods, dyes, or preservatives  pregnant or trying to get pregnant  breast-feeding How should I use this medicine? This medicine is for injection under the skin. It is given by a health care professional in a hospital or clinic setting. A special MedGuide will be given to you before each treatment. Be sure to read this information carefully each time. For Prolia, talk to your pediatrician regarding the use of this medicine in children. Special care may be needed. For Delton See, talk to your pediatrician regarding the use of this medicine in children. While this drug may be prescribed for children as young as 13 years for selected conditions, precautions do apply. Overdosage: If you think you have taken too much of this medicine contact a poison control center or emergency room at once. NOTE: This medicine is only for you. Do not share this medicine with others. What if I miss a dose? It is important not to miss your dose. Call  your doctor or health care professional if you are unable to keep an appointment. What may interact with this medicine? Do not take this medicine with any of the following medications:  other medicines containing denosumab This medicine may also interact with the following medications:  medicines that lower your chance of fighting infection  steroid medicines like prednisone or cortisone This list may not describe all possible interactions. Give your health care provider a list of all the medicines, herbs, non-prescription drugs, or dietary supplements you use. Also tell them if you smoke, drink alcohol, or use illegal drugs. Some items may interact with your medicine. What should I watch for while using this medicine? Visit your doctor or health care professional for regular checks on your progress. Your doctor or health care professional may order blood tests and other tests to see how you are doing. Call your doctor or health care professional for advice if you get a fever, chills or sore throat, or other symptoms of a cold or flu. Do not treat yourself. This drug may decrease your body's ability to fight infection. Try to avoid being around people who are sick. You should make sure you get enough calcium and vitamin D while you are taking this medicine, unless your doctor tells  you not to. Discuss the foods you eat and the vitamins you take with your health care professional. See your dentist regularly. Brush and floss your teeth as directed. Before you have any dental work done, tell your dentist you are receiving this medicine. Do not become pregnant while taking this medicine or for 5 months after stopping it. Talk with your doctor or health care professional about your birth control options while taking this medicine. Women should inform their doctor if they wish to become pregnant or think they might be pregnant. There is a potential for serious side effects to an unborn child. Talk to your  health care professional or pharmacist for more information. What side effects may I notice from receiving this medicine? Side effects that you should report to your doctor or health care professional as soon as possible:  allergic reactions like skin rash, itching or hives, swelling of the face, lips, or tongue  bone pain  breathing problems  dizziness  jaw pain, especially after dental work  redness, blistering, peeling of the skin  signs and symptoms of infection like fever or chills; cough; sore throat; pain or trouble passing urine  signs of low calcium like fast heartbeat, muscle cramps or muscle pain; pain, tingling, numbness in the hands or feet; seizures  unusual bleeding or bruising  unusually weak or tired Side effects that usually do not require medical attention (report to your doctor or health care professional if they continue or are bothersome):  constipation  diarrhea  headache  joint pain  loss of appetite  muscle pain  runny nose  tiredness  upset stomach This list may not describe all possible side effects. Call your doctor for medical advice about side effects. You may report side effects to FDA at 1-800-FDA-1088. Where should I keep my medicine? This medicine is only given in a clinic, doctor's office, or other health care setting and will not be stored at home. NOTE: This sheet is a summary. It may not cover all possible information. If you have questions about this medicine, talk to your doctor, pharmacist, or health care provider.  2020 [employer] Standard (2017-11-21 16:10:44)  

## 2019-09-21 NOTE — Progress Notes (Signed)
[place]  [phone] [phone]    ID: Barbara Parks DOB: Feb 07, 1951  MR#: [id number]  OZD#:[id number]  Patient Care Team: [name], MD as PCP - General (Family Medicine) Jerline Pain, MD (Cardiology) Gaye Pollack, MD (Cardiothoracic Surgery) Magrinat, Virgie Dad, MD as Consulting Physician (Oncology) OTHER MD: Dr Erroll Luna- Merlene Laughter, DDS; Gaynelle Arabian MD   CHIEF COMPLAINT: metastatic breast cancer, formerly on BOLERO study  CURRENT TREATMENT: Fulvestrant, palbociclib, denosumab/Xgeva   INTERVAL HISTORY: Barbara Parks returns today for follow-up and treatment of her metastatic breast cancer.  She was recently changed to Fulvestrant, Palbociclib, and continues on Xgeva.  She is tolerating the fulvestrant well.  She notes that she does get some numbness in her right buttock that radiates around her hip.  She also notes that she had some bone pain last week.    She is in her off week of her Palbociclib.  She is due to restart tomorrow.  She has been in her off week.  She does note a mild fatigue on this.    She is due for [facility] today and continues to tolerate this well.    REVIEW OF SYSTEMS: Barbara Parks is doing moderately well today.  She had some increased back and shoulder pain last week for a day, and that has since resolved.  She denies any fever, chills, chest pain, palpitations, cough, shortness of breath, bowel/bladder changes, headaches, vision changes, or any other concerns.  Barbara Parks hasn't been as active due to the weather.  She is also noting a weight gain.    Barbara Parks is otherwise doing well, and a detailed ROS was otherwise non contributory.    BREAST CANCER HISTORY: From Dr. Collier [name]'s original intake note 04/13/2004:  "[name] has been in good health all of her life. She recently moved from [place] to work here.  She palpated a mass at about the 12 o'clock position in mid-July. She has not noticed any nipple retraction  or skin changes.  She was seen by her primary care doctor who subsequently referred her for a mammogram.  Mammogram was performed on 03/01/04. This demonstrated a spiculated 2 cm mass at the 12 o'clock position in the right breast.  Upper outer quadrant of the left breast shows some distortion.   Physical exam at that time showed a firm, nontender nodule at the 12 o'clock position in the right breast, 5 cm from the nipple.  Ultrasound of this area showed a hypoechoic ill-defined mass, measuring 2.2 x 1.3 x 1.4 cm.  Physical exam of the left breast showed general vague thickening, upper outer quadrant of the left breast with a discrete palpable mass. The ultrasound performed showed a single hypoechoic ill-defined nodule at the 1 o'clock position, measuring 7 x 5 x 6 mm.    She had biopsies of both lesions on 03/02/03.  Needle core biopsy of the lesion on the right breast revealed invasive mammary carcinoma.  Needle core biopsy of the left breast showed a complex fibroadenoma.  Prognostic panel of the lesion on the right breast showed it to be ER positive at 73%, PR positive at 90% and proliferative index 9%, HER-2 was 1+.  Patient was referred to Dr. Annamaria Boots, who performed a simple mastectomy with sentinel lymph node evaluation on 03/22/04.  Final pathology showed this to be an invasive ductal carcinoma  with lobular features, measuring 2.2 cm, grade 2 of 3.  Margins negative for carcinoma.  Invasive ductal carcinoma was extended to involve  deep dermis of the nipple.  Lymphovascular invasion was identified.  Total of 4 sentinel lymph nodes were evaluated.  Touch imprints at the time of the OR was felt to be negative.  Subsequent evaluation showed a 5 mm focus of metastatic carcinoma in one of the four lymph nodes on microscopic after sectioning.  There was extracapsular extension  of one lymph node as well. The remaining three lymph nodes were all negative."  The patient's subsequent history is detailed above.   PAST  MEDICAL HISTORY: Past Medical History:  Diagnosis Date  . Arthritis    left hip  . Asthma   . breast ca 2005   breast/chemo R mastectomy  . Dermatitis   . Diabetes mellitus without complication ([place])   . GERD (gastroesophageal reflux disease)   . Hypercholesteremia   . Metastasis to lung ([place]) dx'd 08/2011  . Osteopenia due to cancer therapy 09/09/2013  . Palpitations   . Personal history of chemotherapy   . Shortness of breath     PAST SURGICAL HISTORY: Past Surgical History:  Procedure Laterality Date  . BREAST SURGERY  2005   right  . CATARACT EXTRACTION W/PHACO Left 01/18/2014   Procedure: CATARACT EXTRACTION PHACO AND INTRAOCULAR LENS PLACEMENT (IOC);  Surgeon: Elta Guadeloupe T. Gershon Crane, MD;  Location: [facility];  Service: Ophthalmology;  Laterality: Left;  CDE 15.79  . CATARACT EXTRACTION W/PHACO Right 02/08/2014   Procedure: CATARACT EXTRACTION PHACO AND INTRAOCULAR LENS PLACEMENT (IOC);  Surgeon: Elta Guadeloupe T. Gershon Crane, MD;  Location: [facility];  Service: Ophthalmology;  Laterality: Right;  CDE 4.41  . CHEST TUBE INSERTION  09/11/2011   Procedure: INSERTION PLEURAL DRAINAGE CATHETER;  Surgeon: Gaye Pollack, MD;  Location: [facility];  Service: Thoracic;  Laterality: Right;  . MASTECTOMY Right   . PERICARDIAL WINDOW  09/11/2011   Procedure: PERICARDIAL WINDOW;  Surgeon: Gaye Pollack, MD;  Location[facility];  Service: Thoracic;  Laterality: N/A;  . REMOVAL OF PLEURAL DRAINAGE CATHETER  12/19/2011   Procedure: REMOVAL OF PLEURAL DRAINAGE CATHETER;  Surgeon: Gaye Pollack, MD;  Location: [facility];  Service: Thoracic;  Laterality: Right;  TO BE DONE IN MINOR ROOM, SHORT STAY  . [place]  . TOTAL HIP ARTHROPLASTY Left 06/07/2015   Procedure: LEFT TOTAL HIP ARTHROPLASTY ANTERIOR APPROACH;  Surgeon: Gaynelle Arabian, MD;  Location: [facility];  Service: Orthopedics;  Laterality: Left;  Marland Kitchen VIDEO BRONCHOSCOPY  09/11/2011   Procedure: VIDEO BRONCHOSCOPY;  Surgeon: Gaye Pollack, MD;  Location: [facility] OR;  Service:  Thoracic;  Laterality: N/A;    FAMILY HISTORY Family History  Problem Relation Age of Onset  . Cancer Mother        breast  . Heart disease Father   . Diabetes Other   . Anesthesia problems Neg Hx    The patient's mother was diagnosed with breast cancer at age 54, she died age 40 from congestive heart failure.The patient's father died from heart disease at age 66.  She has one sister alive & well.  Two brothers alive & well, one with diabetes. The patient's sister was also diagnosed with breast cancer, and was tested for the BRCA gene, and was negative. The patient herself has not been tested. There is no history of ovarian cancer in the family   GYNECOLOGIC HISTORY:  No LMP recorded. Patient is postmenopausal. Menarche age 24, the patient is GX P0. She stopped having periods with chemotherapy in 2005. 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 She was a Archivist. She is single, lives alone with her Shitzu-poodle Barbara Parks.. Family is all in the [place] area.     ADVANCED DIRECTIVES: Not in place. At the 02/23/2014 visit the patient was given the appropriate documents to complete and notarize at her discretion. She tells me she is planning to name her sister, Barbara Parks, as healthcare power of attorney. Barbara Parks can be reached at [phone]   HEALTH MAINTENANCE: Social History   Tobacco Use  . Smoking status: Former Smoker    Packs/day: 1.50    Years: 30.00    Pack years: 45.00    Types: Cigarettes    Quit date: 09/10/2007    Years since quitting: 12.0  . Smokeless tobacco: Never Used  Substance Use Topics  . Alcohol use: No  . Drug use: No    Colonoscopy:  PAP:  Bone density: 04/09/2018, T score -1.8  Lipid panel:  Allergies  Allergen Reactions  . Aspirin     REACTION: upset stomach  . Latex Other (See Comments)    Blistering and skin peels off    . Nsaids Nausea And Vomiting    Extreme nausea and vomiting    Current Outpatient Medications  Medication Sig Dispense Refill  . cholecalciferol (VITAMIN D3) 25 MCG (1000 UT) tablet Take 2 tablets (2,000 Units total) by mouth daily. 180 tablet 4  . cyclobenzaprine (FLEXERIL) 10 MG tablet Take 1 tablet (10 mg total) by mouth 2 (two) times daily as needed for muscle spasms. 20 tablet 0  . gemfibrozil (LOPID) 600 MG tablet TAKE 1 TABLET (600 MG TOTAL) BY MOUTH 2 (TWO) TIMES DAILY BEFORE A MEAL. 180 tablet 1  . loperamide (IMODIUM) 2 MG capsule Take 2 mg by mouth 4 (four) times daily as needed for diarrhea or loose stools. Reported on 11/30/2015    . metFORMIN (GLUCOPHAGE-XR) 500 MG 24 hr tablet Take 1 tablet (500 mg total) by mouth 3 (three) times daily before meals.  3  . palbociclib (IBRANCE) 125 MG tablet Take 1 tablet (125 mg total) by mouth daily. Take for 21 days on, 7 days off, repeat every 28 days. 21 tablet 6  . traMADol (ULTRAM) 50 MG tablet Take 0.5-1 tablets (25-50 mg total) by mouth every 6 (six) hours as needed. 30 tablet 0   No current facility-administered medications for this visit.    OBJECTIVE: Vitals:   09/21/19 1343  BP: 105/64  Pulse: 95  Resp: 18  Temp: 98.9 F (37.2 C)  SpO2: 98%     Body mass index is 23.07 kg/m.   Filed Weights   09/21/19 1343  Weight: 136 lb 8 oz (61.9 kg)   ECOG FS:1 - Symptomatic but completely ambulatory GENERAL: Patient is a well appearing female in no acute distress HEENT:  Sclerae anicteric.  Oropharynx clear and moist. No ulcerations or evidence of oropharyngeal candidiasis. Neck is supple.  NODES:  No cervical, supraclavicular, or axillary lymphadenopathy palpated.  BREAST EXAM:  Right breast s/p mastectomy, no sign of local recurrence, left breast benign LUNGS:  Clear to auscultation bilaterally.  No wheezes or rhonchi. HEART:  Regular rate and rhythm. No murmur appreciated. ABDOMEN:  Soft, nontender.  Positive, normoactive  bowel sounds. No organomegaly palpated. MSK:  No focal spinal tenderness to palpation.  EXTREMITIES:  No peripheral edema.   SKIN:  Clear with no obvious rashes or skin changes. No  nail dyscrasia. NEURO:  Nonfocal. Well oriented.  Appropriate affect.     LAB  RESULTS:  CMP     Component Value Date/Time   NA 138 08/24/2019 1346   NA 139 07/08/2017 0942   K 5.3 (H) 08/24/2019 1346   K 3.8 07/08/2017 0942   CL 107 08/24/2019 1346   CL 109 (H) 12/31/2012 0940   CO2 21 (L) 08/24/2019 1346   CO2 18 (L) 07/08/2017 0942   GLUCOSE 125 (H) 08/24/2019 1346   GLUCOSE 156 (H) 07/08/2017 0942   GLUCOSE 127 (H) 12/31/2012 0940   BUN 25 (H) 08/24/2019 1346   BUN 17.7 07/08/2017 0942   CREATININE 1.15 (H) 08/24/2019 1346   CREATININE 1.18 (H) 12/25/2017 0959   CREATININE 1.0 07/08/2017 0942   CALCIUM 9.9 08/24/2019 1346   CALCIUM 10.4 07/08/2017 0942   PROT 8.0 08/24/2019 1346   PROT 7.7 07/08/2017 0942   ALBUMIN 3.8 08/24/2019 1346   ALBUMIN 3.4 (L) 07/08/2017 0942   AST 17 08/24/2019 1346   AST 50 (H) 12/25/2017 0959   AST 54 (H) 07/08/2017 0942   ALT 9 08/24/2019 1346   ALT 53 12/25/2017 0959   ALT 39 07/08/2017 0942   ALKPHOS 136 (H) 08/24/2019 1346   ALKPHOS 232 (H) 07/08/2017 0942   BILITOT <0.2 (L) 08/24/2019 1346   BILITOT 0.3 12/25/2017 0959   BILITOT 0.42 07/08/2017 0942   GFRNONAA 49 (L) 08/24/2019 1346   GFRNONAA 47 (L) 12/25/2017 0959   GFRAA 57 (L) 08/24/2019 1346   GFRAA 54 (L) 12/25/2017 0959    I No results found for: SPEP  Lab Results  Component Value Date   WBC 4.9 09/21/2019   NEUTROABS 3.2 09/21/2019   HGB 13.5 09/21/2019   HCT 40.9 09/21/2019   MCV 87.6 09/21/2019   PLT 196 09/21/2019      Chemistry      Component Value Date/Time   NA 138 08/24/2019 1346   NA 139 07/08/2017 0942   K 5.3 (H) 08/24/2019 1346   K 3.8 07/08/2017 0942   CL 107 08/24/2019 1346   CL 109 (H) 12/31/2012 0940   CO2 21 (L) 08/24/2019 1346   CO2 18 (L) 07/08/2017  0942   BUN 25 (H) 08/24/2019 1346   BUN 17.7 07/08/2017 0942   CREATININE 1.15 (H) 08/24/2019 1346   CREATININE 1.18 (H) 12/25/2017 0959   CREATININE 1.0 07/08/2017 0942      Component Value Date/Time   CALCIUM 9.9 08/24/2019 1346   CALCIUM 10.4 07/08/2017 0942   ALKPHOS 136 (H) 08/24/2019 1346   ALKPHOS 232 (H) 07/08/2017 0942   AST 17 08/24/2019 1346   AST 50 (H) 12/25/2017 0959   AST 54 (H) 07/08/2017 0942   ALT 9 08/24/2019 1346   ALT 53 12/25/2017 0959   ALT 39 07/08/2017 0942   BILITOT <0.2 (L) 08/24/2019 1346   BILITOT 0.3 12/25/2017 0959   BILITOT 0.42 07/08/2017 0942       Lab Results  Component Value Date   LABCA2 58 (H) 09/14/2012    No components found for: [id number]  No results for input(s): INR in the last 168 hours.  Urinalysis    Component Value Date/Time   COLORURINE YELLOW 11/25/2017 0901   APPEARANCEUR HAZY (A) 11/25/2017 0901   LABSPEC 1.017 11/25/2017 0901   LABSPEC 1.030 07/13/2015 0841   PHURINE 5.0 11/25/2017 0901   GLUCOSEU NEGATIVE 11/25/2017 0901   GLUCOSEU Negative 07/13/2015 0841   HGBUR MODERATE (A) 11/25/2017 0901  BILIRUBINUR NEGATIVE 11/25/2017 0901   BILIRUBINUR Negative 07/13/2015 0841   KETONESUR NEGATIVE 11/25/2017 0901   PROTEINUR 100 (A) 11/25/2017 0901   UROBILINOGEN 0.2 07/13/2015 0841   NITRITE NEGATIVE 11/25/2017 0901   LEUKOCYTESUR NEGATIVE 11/25/2017 0901   LEUKOCYTESUR Negative 07/13/2015 0841    STUDIES: No results found.  ASSESSMENT: 70 y.o. Barbara Parks, Barbara Parks with stage IV breast cancer, on BOLERO-4 trial  (1) status post right mastectomy and sentinel lymph node sampling 03/22/2004 for a right upper-outer quadrant pT2 pN1, stage IIB invasive ductal carcinoma with lobular features, grade 2, estrogen receptor and progesterone receptor positive, HER-2 negative, with an MIB-1 of 9% ([id number] and [id number])  (2) addition all right axillary lymph node sampling 05/07/2004 showed 2 benign lymph nodes (4 lymph nodes  previously removed, so total was one positive lymph node out of 6; [id number])  (3) the patient was evaluated by radiation oncology; no postmastectomy radiation recommended  (4) adjuvant chemotherapy with dose dense doxorubicin and cyclophosphamide x4 cycles (first cycle delayed one week) followed by dose dense paclitaxel x4 was completed 09/18/2004  (5) tamoxifen started March 2006, discontinued 2009  METASTATIC DISEASE: February 2013 (6) presenting with a large pericardial effusion, large right pleural effusion and possible right middle lobe bronchial obstruction February 2013, status post pericardial window placement, fiberoptic bronchoscopy and right Pleurx placement 09/11/2011, with biopsy of the bronchus intermedius and pericardium positive for metastatic breast cancer, estrogen receptor 91% positive with moderate staining intensity, progesterone receptor 100% positive with strong staining intensity, with an MIB-1 of 35%, and no HER-2 amplification, the signals ratio being 1.37 ([id number] [id number])  (7) enrolled in BOLERO-4 trial 10/10/2011, receiving letrozole and everolimus  (a) two small areas of enhancement in the cerebellum noted by brain MRI 10/03/2011 were no longer apparent on repeat MRI 08/21/2012-- most recent brain MRI 04/01/2014 showed no evidence of intracranial metastatic disease  (b) sclerotic lesions in left iliac bone and sacrum have not been biopsied; stable; to start zolendronate after patient updates her dental care (extraction planned)--never started  (c) RLL lung nodule, stable (rescanned 07/12/2014 and 08/11/2014) R hilar and subcarinal lymph nodes: stable  (d) CT of the chest:03/25/2018, stable  (e) CT Angio 12/12/2018 stable  (f) off BOLERO trial (trial ended) as of July 2020, but continuing on study drugs  (g) letrozole and everolimus discontinued January 2021 with disease progression  (8) additional problems:  (a) hepatic steatosis  (b) COPD/ emphysema/ asthma  (c)  advanced L hip osteoarthritis, status post left total hip replacement 06/07/2015  (d) aortoiliac atherosclerosis  (e) dental evaluation pending w possible dental extractions  (f) likely thalassemia trait  (g) hyperlipidemia  (9) Bone density concerns: DEXA scan 08/11/2013 was normal  (a) repeat DEXA scan 04/09/2018 shows a T score of -1.8, osteopenia  (10) disease monitoring:  (a) PET scan 12/22/2018 shows no hypermetabolic soft tissue metastases but multifocal bony metastasis  (b) denosumab/Xgeva started on 02/25/2019, repeated every 28 days (delayed due to dental extraction healing issues)  (c) bone scan 05/13/2019 serves as baseline study with increased activity in the right scapular, lower ribs and right femoral head (stable compared to PET scan of May 2020  (d) bone scan 07/09/2019 stable  (e) CT chest 07/09/2019 shows stable lung lesions but emerging left liver lobe 0.7 cm lesion  (f) liver MRI 08/03/2019 shows approximately 8 liver lesions, the largest measuring 1.8 cm.  (g) liver biopsy 08/06/2019 confirms metastatic breast cancer, estrogen and progesterone receptor positive, with an MIB-1 of 5% and  HER-2 not amplified  (11) denosumab/Xgeva begun 02/25/2019 repeated every 4 weeks  (12) fulvestrant started 08/24/2019  (a) palbociclib 125 mg daily 21 days on 7 days off started 08/25/2019     PLAN: Barbara Parks is tolerating her treatment change well.  She has no signs of progression, and continues on Fulvestrant, Xgeva, and Palbociclib.  Her CBC is stable and she is cleared to restart her medication.  She is due to restart this tomorrow.   Barbara Parks and I reviewed her pain in her shoulders.  She will monitor this and let us know if it returns.  Since it was only on one occasion, it is not as worrisome as if it were constant.    Barbara Parks has gained some weight.  I reassured her that it is likely the adjustment to a new treatment.  I recommended that she remain active.    We will see Barbara Parks back  in 4 weeks.  She will be due for scans prior to her April 20 visit with Dr. Jana Hakim.  I placed orders for these today.  She was recommended to continue with the appropriate pandemic precautions. She knows to call for any questions that may arise between now and her next appointment.  We are happy to see her sooner if needed.  Total encounter time: 30 minutes  Wilber Bihari, NP 09/21/19 2:03 PM Medical Oncology and Hematology [facility] [place], [place] [address] Tel. [phone]    Fax. [phone]  *Total Encounter Time as defined by the Centers for Medicare and Medicaid Services includes, in addition to the face-to-face time of a patient visit (documented in the note above) non-face-to-face time: obtaining and reviewing outside history, ordering and reviewing medications, tests or procedures, care coordination (communications with other health care professionals or caregivers) and documentation in the medical record.

## 2019-09-22 LAB — CANCER ANTIGEN 27.29: CA 27.29: 442.1 U/mL — ABNORMAL HIGH (ref 0.0–38.6)

## 2019-09-29 IMAGING — CT CT CHEST W/ CM
2 of 3 series · 15 of 36 positions shown, 18 images · IV contrast (omnipaque)
Comparison: 09/30/2017

CLINICAL DATA: Metastatic breast cancer

EXAM:
CT CHEST WITH CONTRAST
TECHNIQUE: Multidetector CT imaging of the chest was performed during
intravenous contrast administration.
CONTRAST:  75mL OMNIPAQUE IOHEXOL 300 MG/ML  SOLN

[Series 2: axial st · axial · 0.77mm/px · z∈[-503,-205]mm · 12 of 175 slices shown, 15 images]
[im 13/175  mediastinal]
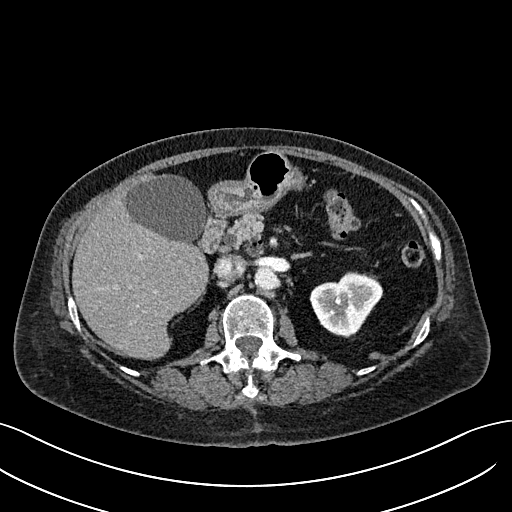
[im 13/175  lung]
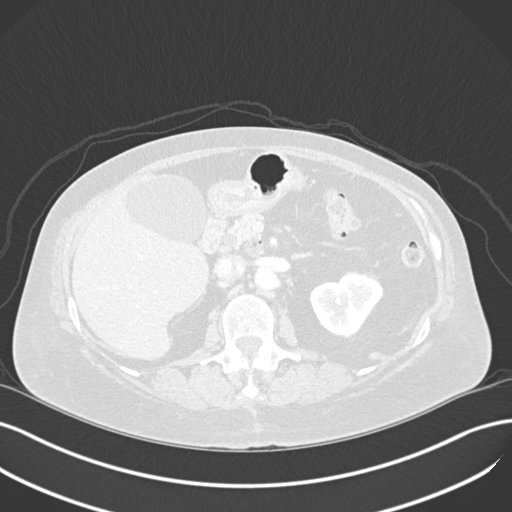
[im 26/175  lung]
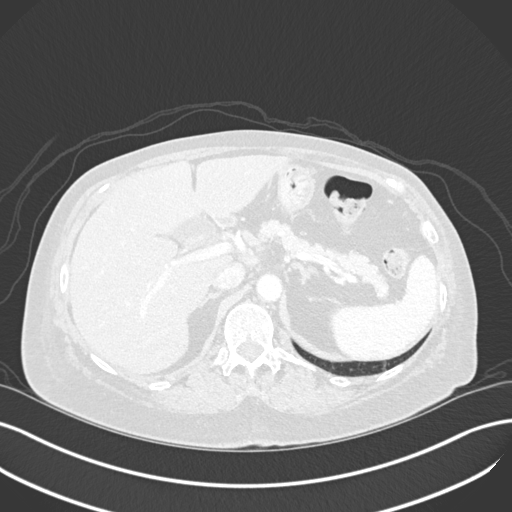
[im 39/175  lung]
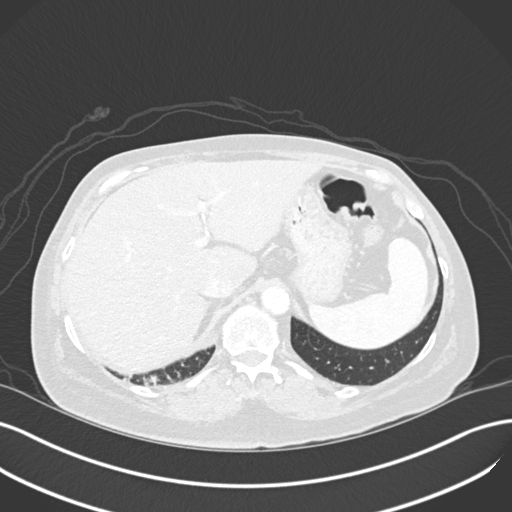
[im 52/175  lung]
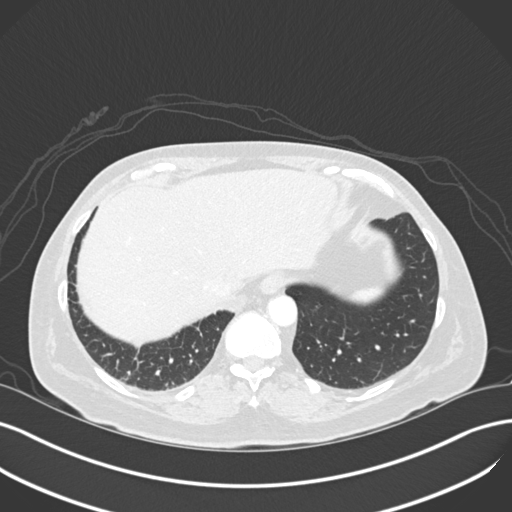
[im 65/175  mediastinal]
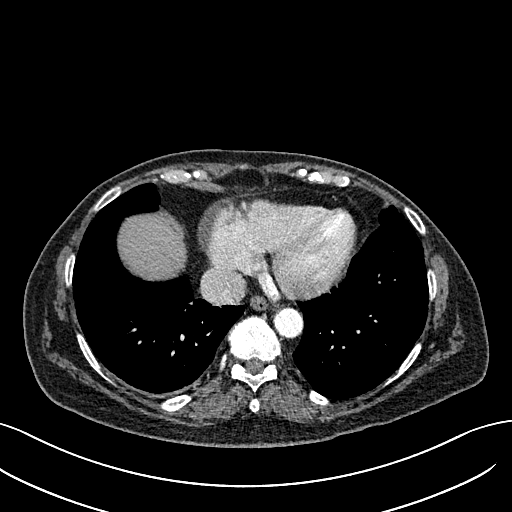
[im 65/175  lung]
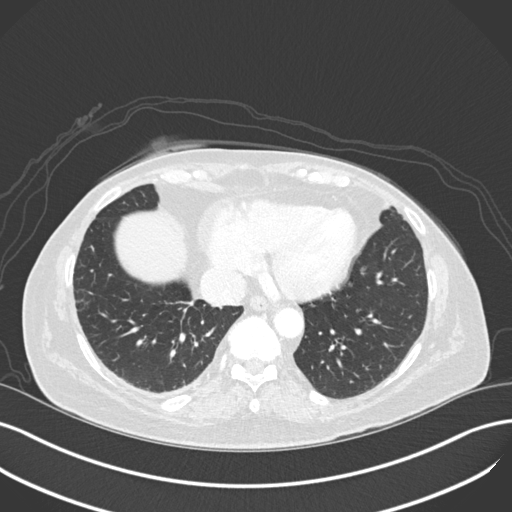
[im 78/175  lung]
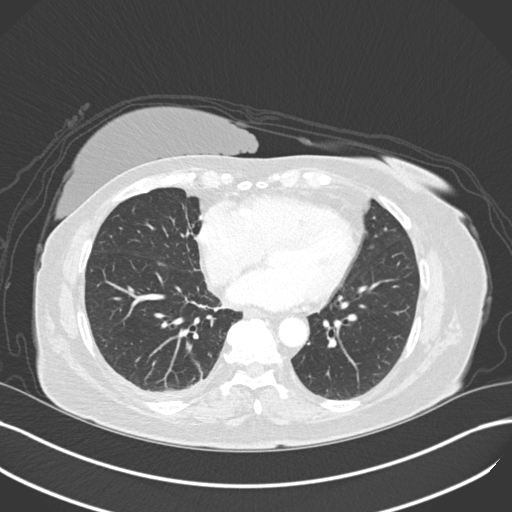
[im 97/175  lung]
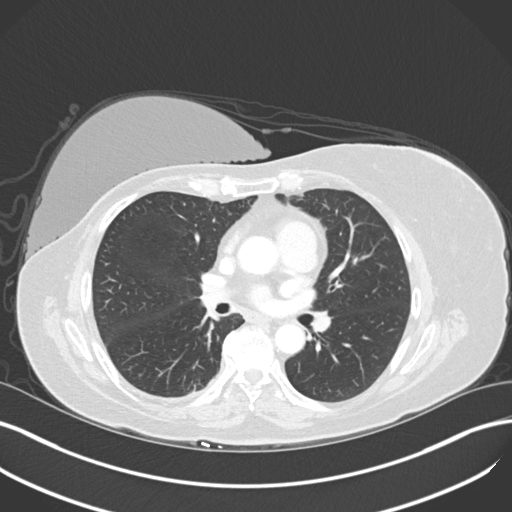
[im 110/175  lung]
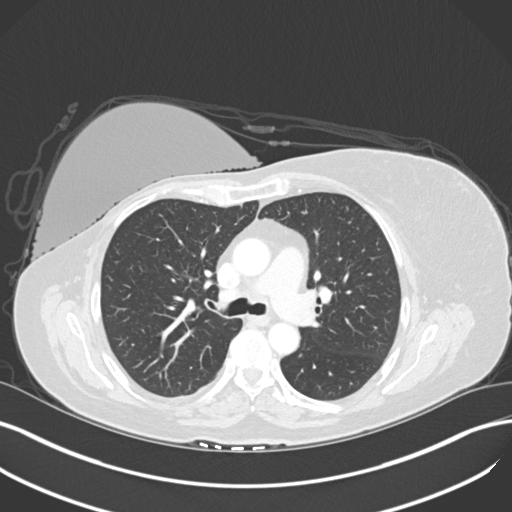
[im 123/175  mediastinal]
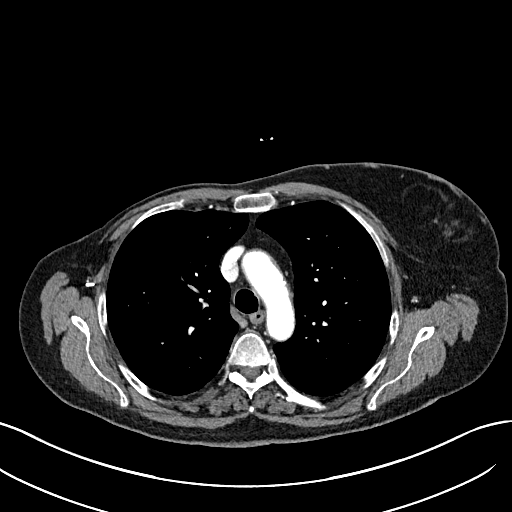
[im 123/175  lung]
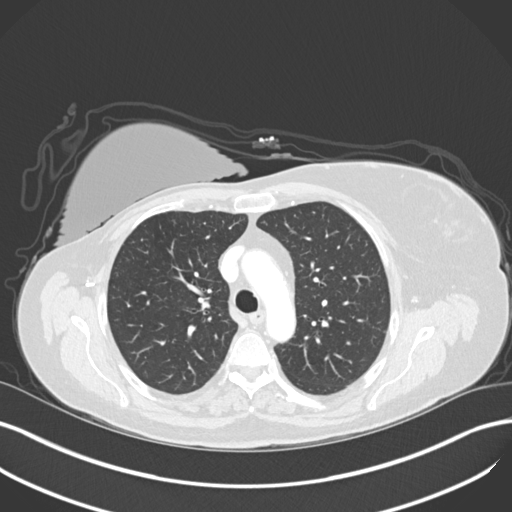
[im 136/175  lung]
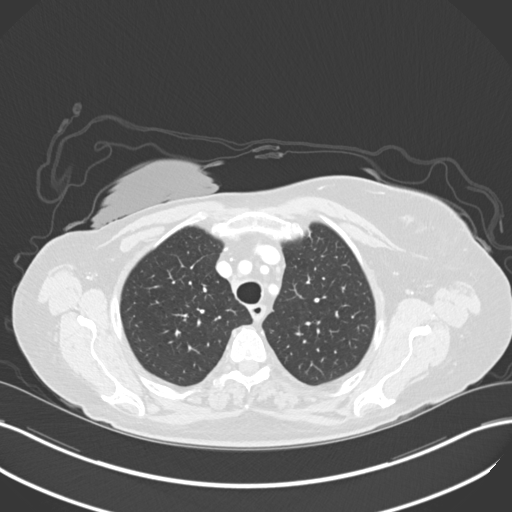
[im 149/175  lung]
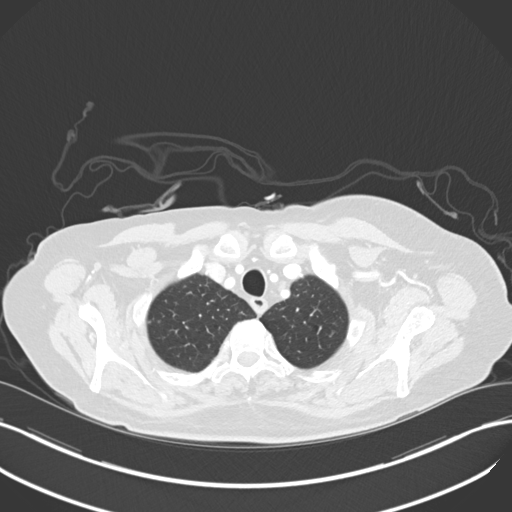
[im 162/175  lung]
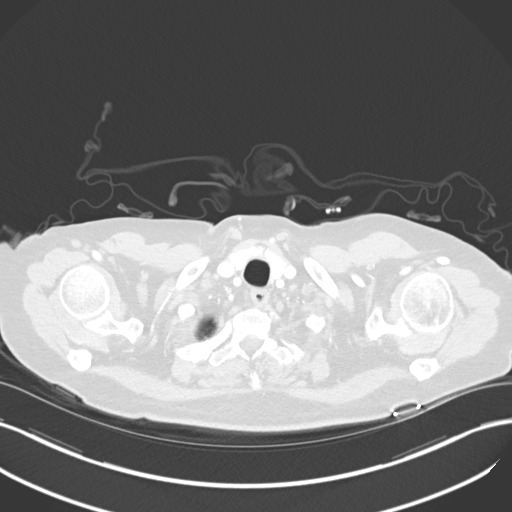

[Series 6: coronal · coronal · 0.70mm/px · 3 of 114 slices shown]
[im 23/114  lung]
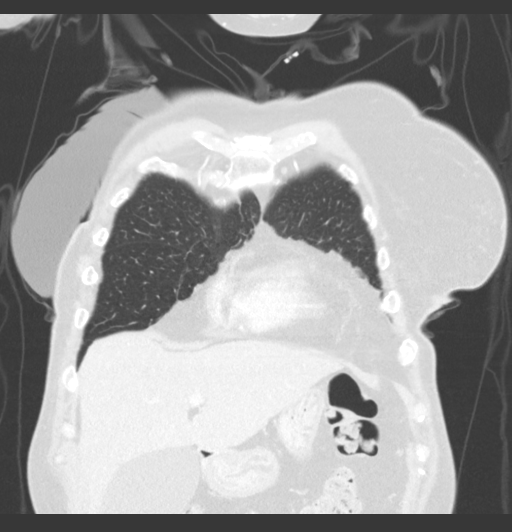
[im 46/114  lung]
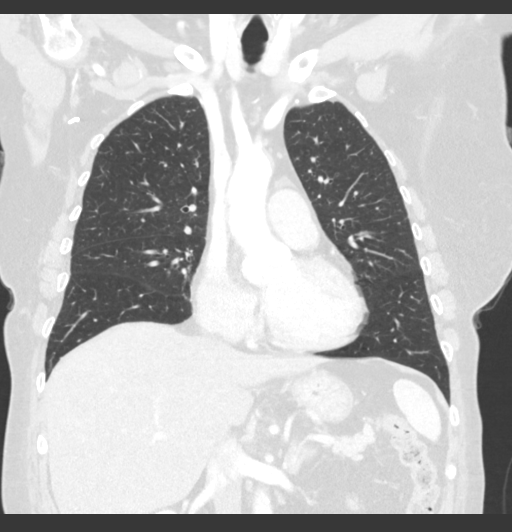
[im 68/114  lung]
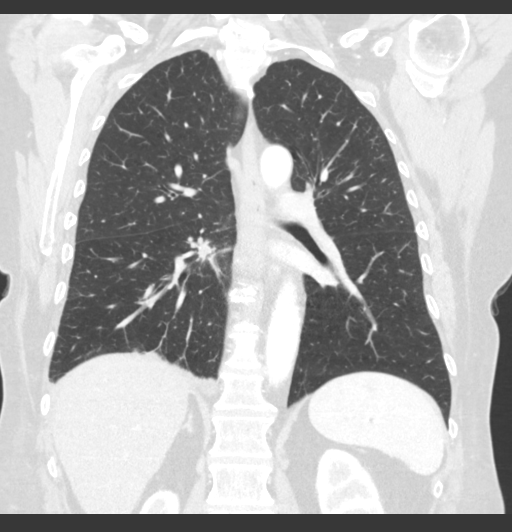

[15 of 36 positions shown; findings below may reference images not displayed]

FINDINGS: RECIST

Target Lesions:

1. 5 mm right lower lobe nodule (series 5/image 102), unchanged
2. 7 x 13 mm nodule in the posterior right lung base (series 5/image
139), unchanged
3. 6 mm short axis subcarinal node (series 2/image 36), unchanged

Non-target Lesions:

1. Mild right posterior pleural thickening, unchanged

Cardiovascular: The heart is normal in size. No pericardial
effusion.

No evidence of thoracic aortic aneurysm. Mild atherosclerotic
calcifications of the aortic arch.

Mediastinum/Nodes: No suspicious mediastinal or hilar
lymphadenopathy. 6 mm short axis subcarinal node (series 2/image
36), poorly visualized, grossly unchanged.

Status post right axillary lymph node dissection.

Lungs/Pleura: 7 x 13 mm nodule in the posterior right lung base
(series 5/image 139), unchanged.

Additional 5 mm subpleural nodule in the posterior right lower lobe
(series 5/image 102), unchanged.

No focal consolidation.  No pleural effusion or pneumothorax.

Upper Abdomen: Visualized upper abdomen is grossly unremarkable,
noting a stable 14 mm left adrenal nodule, likely reflecting a
benign adrenal adenoma.

Musculoskeletal: Mild degenerative changes of the lower thoracic
spine.
IMPRESSION: Stable 7 x 13 mm nodule in the posterior right lung base.

Additional stable ancillary findings as above.

RECIST 1.0 measurements, as above.

Aortic Atherosclerosis ([id number].E).

## 2019-10-18 NOTE — Progress Notes (Signed)
[place]  [phone] [phone]    ID: Barbara Barbara Parks DOB: April 12, 1951  MR#: [id number]  FBP#:[id number]  Patient Care Team: [name], MD as PCP - General (Family Medicine) Jerline Pain, MD (Cardiology) Gaye Pollack, MD (Cardiothoracic Surgery) Magrinat, Virgie Dad, MD as Consulting Physician (Oncology) OTHER MD: Dr Erroll Luna- Merlene Laughter, DDS; Gaynelle Arabian MD   CHIEF COMPLAINT: metastatic breast cancer, formerly on BOLERO study  CURRENT TREATMENT: Fulvestrant, palbociclib, denosumab/Xgeva   INTERVAL HISTORY: Barbara Barbara Parks returns today for follow-up and treatment of her metastatic breast cancer.  She was recently changed to Fulvestrant, Palbociclib, and continues on Xgeva.  She receives fulvestrant every 4 weeks.  She tolerates this well.    She is in her off week of her Palbociclib. She starts back on Thursday, 10/21/2019.  She tolerates this well other than some reflux.  This improved with prilosec.    She is due for [facility] today and continues to tolerate this well.    REVIEW OF SYSTEMS: Barbara Barbara Parks was complaining of a one time episode of increased shoulder pain.  Since that visit, she hasn't had any recurrence of this pain.  She notes her back pain has improved, and she is feeling better.  She is frustrated because she is gaining weight.  She has a good appetite.  She notes her fatigue is improving.  She has not required tramadol, because she hasn't been in any pain.    She denies any fever, chills, chest pain, palpitations, cough, bowel/bladder changes, nausea, vomiting. Headaches, vision issues, mucositis.  A detailed ROS was otherwise non contributory.    BREAST CANCER HISTORY: From Dr. Collier Salina Barbara Parks's original intake note 04/13/2004:  "This Barbara Parks has been in good health all of her life. She recently moved from [place] to work here.  She palpated a mass at about the 12 o'clock position in mid-July. She has not noticed any nipple  retraction or skin changes.  She was seen by her primary care doctor who subsequently referred her for a mammogram.  Mammogram was performed on 03/01/04. This demonstrated a spiculated 2 cm mass at the 12 o'clock position in the right breast.  Upper outer quadrant of the left breast shows some distortion.   Physical exam at that time showed a firm, nontender nodule at the 12 o'clock position in the right breast, 5 cm from the nipple.  Ultrasound of this area showed a hypoechoic ill-defined mass, measuring 2.2 x 1.3 x 1.4 cm.  Physical exam of the left breast showed general vague thickening, upper outer quadrant of the left breast with a discrete palpable mass. The ultrasound performed showed a single hypoechoic ill-defined nodule at the 1 o'clock position, measuring 7 x 5 x 6 mm.    She had biopsies of both lesions on 03/02/03.  Needle core biopsy of the lesion on the right breast revealed invasive mammary carcinoma.  Needle core biopsy of the left breast showed a complex fibroadenoma.  Prognostic panel of the lesion on the right breast showed it to be ER positive at 73%, PR positive at 90% and proliferative index 9%, HER-2 was 1+.  Patient was referred to Dr. Annamaria Boots, who performed a simple mastectomy with sentinel lymph node evaluation on 03/22/04.  Final pathology showed this to be an invasive ductal carcinoma  with lobular features, measuring 2.2 cm, grade 2 of 3.  Margins negative for carcinoma.  Invasive ductal carcinoma was extended to involve deep dermis of the nipple.  Lymphovascular invasion was identified.  Total of 4 sentinel lymph nodes were evaluated.  Touch imprints at the time of the OR was felt to be negative.  Subsequent evaluation showed a 5 mm focus of metastatic carcinoma in one of the four lymph nodes on microscopic after sectioning.  There was extracapsular extension  of one lymph node as well. The remaining three lymph nodes were all negative."  The patient's subsequent history is detailed  above.   PAST MEDICAL HISTORY: Past Medical History:  Diagnosis Date  . Arthritis    left hip  . Asthma   . breast ca 2005   breast/chemo R mastectomy  . Dermatitis   . Diabetes mellitus without complication ([employer])   . GERD (gastroesophageal reflux disease)   . Hypercholesteremia   . Metastasis to lung ([place]) dx'd 08/2011  . Osteopenia due to cancer therapy 09/09/2013  . Palpitations   . Personal history of chemotherapy   . Shortness of breath     PAST SURGICAL HISTORY: Past Surgical History:  Procedure Laterality Date  . BREAST SURGERY  2005   right  . CATARACT EXTRACTION W/PHACO Left 01/18/2014   Procedure: CATARACT EXTRACTION PHACO AND INTRAOCULAR LENS PLACEMENT (IOC);  Surgeon: Elta Guadeloupe T. Gershon Crane, MD;  Location: [facility];  Service: Ophthalmology;  Laterality: Left;  CDE 15.79  . CATARACT EXTRACTION W/PHACO Right 02/08/2014   Procedure: CATARACT EXTRACTION PHACO AND INTRAOCULAR LENS PLACEMENT (IOC);  Surgeon: Elta Guadeloupe T. Gershon Crane, MD;  Location: [facility];  Service: Ophthalmology;  Laterality: Right;  CDE 4.41  . CHEST TUBE INSERTION  09/11/2011   Procedure: INSERTION PLEURAL DRAINAGE CATHETER;  Surgeon: Gaye Pollack, MD;  Location: [place];  Service: Thoracic;  Laterality: Right;  . MASTECTOMY Right   . PERICARDIAL WINDOW  09/11/2011   Procedure: PERICARDIAL WINDOW;  Surgeon: Gaye Pollack, MD;  Location: [facility] OR;  Service: Thoracic;  Laterality: N/A;  . REMOVAL OF PLEURAL DRAINAGE CATHETER  12/19/2011   Procedure: REMOVAL OF PLEURAL DRAINAGE CATHETER;  Surgeon: Gaye Pollack, MD;  Location: [place];  Service: Thoracic;  Laterality: Right;  TO BE DONE IN MINOR ROOM, SHORT STAY  . [place]  . TOTAL HIP ARTHROPLASTY Left 06/07/2015   Procedure: LEFT TOTAL HIP ARTHROPLASTY ANTERIOR APPROACH;  Surgeon: Gaynelle Arabian, MD;  Location: [facility];  Service: Orthopedics;  Laterality: Left;  Marland Kitchen VIDEO BRONCHOSCOPY  09/11/2011   Procedure: VIDEO BRONCHOSCOPY;  Surgeon: Gaye Pollack, MD;  Location: [facility]  OR;  Service: Thoracic;  Laterality: N/A;    FAMILY HISTORY Family History  Problem Relation Age of Onset  . Cancer Mother        breast  . Heart disease Father   . Diabetes Other   . Anesthesia problems Neg Hx    The patient's mother was diagnosed with breast cancer at age 61, she died age 58 from congestive heart failure.The patient's father died from heart disease at age 62.  She has one sister alive & well.  Two brothers alive & well, one with diabetes. The patient's sister was also diagnosed with breast cancer, and was tested for the BRCA gene, and was negative. The patient herself has not been tested. There is no history of ovarian cancer in the family   GYNECOLOGIC HISTORY:  No LMP recorded. Patient is postmenopausal. Menarche age 32, the patient is GX P0. She stopped having periods with chemotherapy in 2005. 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 She was a Archivist. She is single, lives alone with her Shitzu-poodle Barbara Barbara Parks.. Family is all in the [place] area.     ADVANCED DIRECTIVES: Not in place. At the 02/23/2014 visit the patient was given the appropriate documents to complete and notarize at her discretion. She tells me she is planning to name her sister, Barbara Barbara Parks, as healthcare power of attorney. Barbara Barbara Parks can be reached at [phone]   HEALTH MAINTENANCE: Social History   Tobacco Use  . Smoking status: Former Smoker    Packs/day: 1.50    Years: 30.00    Pack years: 45.00    Types: Cigarettes    Quit date: 09/10/2007    Years since quitting: 12.1  . Smokeless tobacco: Never Used  Substance Use Topics  . Alcohol use: No  . Drug use: No    Colonoscopy:  PAP:  Bone density: 04/09/2018, T score -1.8  Lipid panel:  Allergies  Allergen Reactions  . Aspirin     REACTION: upset stomach  . Latex Other (See Comments)    Blistering and  skin peels off  . Nsaids Nausea And Vomiting    Extreme nausea and vomiting    Current Outpatient Medications  Medication Sig Dispense Refill  . cholecalciferol (VITAMIN D3) 25 MCG (1000 UT) tablet Take 2 tablets (2,000 Units total) by mouth daily. 180 tablet 4  . cyclobenzaprine (FLEXERIL) 10 MG tablet Take 1 tablet (10 mg total) by mouth 2 (two) times daily as needed for muscle spasms. 20 tablet 0  . gemfibrozil (LOPID) 600 MG tablet TAKE 1 TABLET (600 MG TOTAL) BY MOUTH 2 (TWO) TIMES DAILY BEFORE A MEAL. 180 tablet 1  . loperamide (IMODIUM) 2 MG capsule Take 2 mg by mouth 4 (four) times daily as needed for diarrhea or loose stools. Reported on 11/30/2015    . metFORMIN (GLUCOPHAGE-XR) 500 MG 24 hr tablet Take 1 tablet (500 mg total) by mouth 3 (three) times daily before meals.  3  . palbociclib (IBRANCE) 125 MG tablet Take 1 tablet (125 mg total) by mouth daily. Take for 21 days on, 7 days off, repeat every 28 days. 21 tablet 6  . traMADol (ULTRAM) 50 MG tablet Take 0.5-1 tablets (25-50 mg total) by mouth every 6 (six) hours as needed. 30 tablet 0   No current facility-administered medications for this visit.    OBJECTIVE: Vitals:   10/19/19 1346  BP: 108/68  Pulse: 95  Resp: 18  Temp: 98.2 F (36.8 C)  SpO2: 99%     Body mass index is 23.47 kg/m.   Filed Weights   10/19/19 1346  Weight: 138 lb 14.4 oz (63 kg)   ECOG FS:1 - Symptomatic but completely ambulatory GENERAL: Patient is a well appearing female in no acute distress HEENT:  Sclerae anicteric.  Mask in place. Neck is supple.  NODES:  No cervical, supraclavicular, or axillary lymphadenopathy palpated.  BREAST EXAM:  Right breast s/p mastectomy, no sign of local recurrence, left breast benign LUNGS:  Clear to auscultation bilaterally.  No wheezes or rhonchi. HEART:  Regular rate and rhythm. No murmur appreciated. ABDOMEN:  Soft, nontender.  Positive, normoactive bowel sounds. No organomegaly palpated. MSK:  No focal  spinal tenderness to palpation.  EXTREMITIES:  No peripheral edema.   SKIN:  Clear with no obvious rashes or skin changes. No nail dyscrasia. NEURO:  Nonfocal. Well oriented.  Appropriate affect.     LAB  RESULTS:  CMP  Component Value Date/Time   NA 142 10/19/2019 1258   NA 139 07/08/2017 0942   K 4.8 10/19/2019 1258   K 3.8 07/08/2017 0942   CL 107 10/19/2019 1258   CL 109 (H) 12/31/2012 0940   CO2 25 10/19/2019 1258   CO2 18 (L) 07/08/2017 0942   GLUCOSE 93 10/19/2019 1258   GLUCOSE 156 (H) 07/08/2017 0942   GLUCOSE 127 (H) 12/31/2012 0940   BUN 25 (H) 10/19/2019 1258   BUN 17.7 07/08/2017 0942   CREATININE 1.18 (H) 10/19/2019 1258   CREATININE 1.18 (H) 12/25/2017 0959   CREATININE 1.0 07/08/2017 0942   CALCIUM 10.0 10/19/2019 1258   CALCIUM 10.4 07/08/2017 0942   PROT 6.8 10/19/2019 1258   PROT 7.7 07/08/2017 0942   ALBUMIN 3.5 10/19/2019 1258   ALBUMIN 3.4 (L) 07/08/2017 0942   AST 14 (L) 10/19/2019 1258   AST 50 (H) 12/25/2017 0959   AST 54 (H) 07/08/2017 0942   ALT 12 10/19/2019 1258   ALT 53 12/25/2017 0959   ALT 39 07/08/2017 0942   ALKPHOS 81 10/19/2019 1258   ALKPHOS 232 (H) 07/08/2017 0942   BILITOT 0.3 10/19/2019 1258   BILITOT 0.3 12/25/2017 0959   BILITOT 0.42 07/08/2017 0942   GFRNONAA 47 (L) 10/19/2019 1258   GFRNONAA 47 (L) 12/25/2017 0959   GFRAA 55 (L) 10/19/2019 1258   GFRAA 54 (L) 12/25/2017 0959    I No results found for: SPEP  Lab Results  Component Value Date   WBC 4.9 10/19/2019   NEUTROABS 3.3 10/19/2019   HGB 14.0 10/19/2019   HCT 42.6 10/19/2019   MCV 93.6 10/19/2019   PLT 179 10/19/2019      Chemistry      Component Value Date/Time   NA 142 10/19/2019 1258   NA 139 07/08/2017 0942   K 4.8 10/19/2019 1258   K 3.8 07/08/2017 0942   CL 107 10/19/2019 1258   CL 109 (H) 12/31/2012 0940   CO2 25 10/19/2019 1258   CO2 18 (L) 07/08/2017 0942   BUN 25 (H) 10/19/2019 1258   BUN 17.7 07/08/2017 0942   CREATININE 1.18  (H) 10/19/2019 1258   CREATININE 1.18 (H) 12/25/2017 0959   CREATININE 1.0 07/08/2017 0942      Component Value Date/Time   CALCIUM 10.0 10/19/2019 1258   CALCIUM 10.4 07/08/2017 0942   ALKPHOS 81 10/19/2019 1258   ALKPHOS 232 (H) 07/08/2017 0942   AST 14 (L) 10/19/2019 1258   AST 50 (H) 12/25/2017 0959   AST 54 (H) 07/08/2017 0942   ALT 12 10/19/2019 1258   ALT 53 12/25/2017 0959   ALT 39 07/08/2017 0942   BILITOT 0.3 10/19/2019 1258   BILITOT 0.3 12/25/2017 0959   BILITOT 0.42 07/08/2017 0942       Lab Results  Component Value Date   LABCA2 58 (H) 09/14/2012    No components found for: [id number]  No results for input(s): INR in the last 168 hours.  Urinalysis    Component Value Date/Time   COLORURINE YELLOW 11/25/2017 0901   APPEARANCEUR HAZY (A) 11/25/2017 0901   LABSPEC 1.017 11/25/2017 0901   LABSPEC 1.030 07/13/2015 0841   PHURINE 5.0 11/25/2017 0901   GLUCOSEU NEGATIVE 11/25/2017 0901   GLUCOSEU Negative 07/13/2015 0841   HGBUR MODERATE (A) 11/25/2017 0901   BILIRUBINUR NEGATIVE 11/25/2017 0901   BILIRUBINUR Negative 07/13/2015 0841   KETONESUR NEGATIVE 11/25/2017 0901   PROTEINUR 100 (A) 11/25/2017 0901   UROBILINOGEN 0.2 07/13/2015  0841   NITRITE NEGATIVE 11/25/2017 0901   LEUKOCYTESUR NEGATIVE 11/25/2017 0901   LEUKOCYTESUR Negative 07/13/2015 0841    STUDIES: No results found.  ASSESSMENT: 69 y.o. Barbara Barbara Parks, Barbara Barbara Parks with stage IV breast cancer, on BOLERO-4 trial  (1) status post right mastectomy and sentinel lymph node sampling 03/22/2004 for a right upper-outer quadrant pT2 pN1, stage IIB invasive ductal carcinoma with lobular features, grade 2, estrogen receptor and progesterone receptor positive, HER-2 negative, with an MIB-1 of 9% ([id number] and [id number])  (2) addition all right axillary lymph node sampling 05/07/2004 showed 2 benign lymph nodes (4 lymph nodes previously removed, so total was one positive lymph node out of 6; [id number])  (3) the  patient was evaluated by radiation oncology; no postmastectomy radiation recommended  (4) adjuvant chemotherapy with dose dense doxorubicin and cyclophosphamide x4 cycles (first cycle delayed one week) followed by dose dense paclitaxel x4 was completed 09/18/2004  (5) tamoxifen started March 2006, discontinued 2009  METASTATIC DISEASE: February 2013 (6) presenting with a large pericardial effusion, large right pleural effusion and possible right middle lobe bronchial obstruction February 2013, status post pericardial window placement, fiberoptic bronchoscopy and right Pleurx placement 09/11/2011, with biopsy of the bronchus intermedius and pericardium positive for metastatic breast cancer, estrogen receptor 91% positive with moderate staining intensity, progesterone receptor 100% positive with strong staining intensity, with an MIB-1 of 35%, and no HER-2 amplification, the signals ratio being 1.37 (SZA [id number])  (7) enrolled in BOLERO-4 trial 10/10/2011, receiving letrozole and everolimus  (a) two small areas of enhancement in the cerebellum noted by brain MRI 10/03/2011 were no longer apparent on repeat MRI 08/21/2012-- most recent brain MRI 04/01/2014 showed no evidence of intracranial metastatic disease  (b) sclerotic lesions in left iliac bone and sacrum have not been biopsied; stable; to start zolendronate after patient updates her dental care (extraction planned)--never started  (c) RLL lung nodule, stable (rescanned 07/12/2014 and 08/11/2014) R hilar and subcarinal lymph nodes: stable  (d) CT of the chest:03/25/2018, stable  (e) CT Angio 12/12/2018 stable  (f) off BOLERO trial (trial ended) as of July 2020, but continuing on study drugs  (g) letrozole and everolimus discontinued January 2021 with disease progression  (8) additional problems:  (a) hepatic steatosis  (b) COPD/ emphysema/ asthma  (c) advanced L hip osteoarthritis, status post left total hip replacement 06/07/2015  (d)  aortoiliac atherosclerosis  (e) dental evaluation pending w possible dental extractions  (f) likely thalassemia trait  (g) hyperlipidemia  (9) Bone density concerns: DEXA scan 08/11/2013 was normal  (a) repeat DEXA scan 04/09/2018 shows a T score of -1.8, osteopenia  (10) disease monitoring:  (a) PET scan 12/22/2018 shows no hypermetabolic soft tissue metastases but multifocal bony metastasis  (b) denosumab/Xgeva started on 02/25/2019, repeated every 28 days (delayed due to dental extraction healing issues)  (c) bone scan 05/13/2019 serves as baseline study with increased activity in the right scapular, lower ribs and right femoral head (stable compared to PET scan of May 2020  (d) bone scan 07/09/2019 stable  (e) CT chest 07/09/2019 shows stable lung lesions but emerging left liver lobe 0.7 cm lesion  (f) liver MRI 08/03/2019 shows approximately 8 liver lesions, the largest measuring 1.8 cm.  (g) liver biopsy 08/06/2019 confirms metastatic breast cancer, estrogen and progesterone receptor positive, with an MIB-1 of 5% and HER-2 not amplified  (11) denosumab/Xgeva begun 02/25/2019 repeated every 4 weeks  (12) fulvestrant started 08/24/2019  (a) palbociclib 125 mg daily 21 days on 7 days  off started 08/25/2019     PLAN:  Tamaya continues on treatment with Fulvestrant and Xgeva given every 4 weeks and Palbociclib given daily 3 weeks on and 1 week off.  I reviewed her labs with her which are stable.  She will resume the Palbociclib on Thursday when she is scheduled to.    Barbara Barbara Parks has her repeat staging scans scheduled for about 3 weeks from now.  She will undergo MRI liver, bone scan, and CT chest.  She will see Dr. Jana Hakim the following week to review her scans and her treatment tolerance.    Barbara Barbara Parks and I talked about healthy diet and exercise.    We will see Barbara Barbara Parks back in 4 weeks.  She is undergoing scans on 4/14 and 4/15 and will see Dr. Jana Hakim on 11/16/2019.  She was recommended to  continue with the appropriate pandemic precautions. She knows to call for any questions that may arise between now and her next appointment.  We are happy to see her sooner if needed.  Total encounter time: 20 minutes  Wilber Bihari, NP 10/19/19 2:07 PM Medical Oncology and Hematology [facility] [place],  [address] Tel. [phone]    Fax. [phone]  *Total Encounter Time as defined by the Centers for Medicare and Medicaid Services includes, in addition to the face-to-face time of a patient visit (documented in the note above) non-face-to-face time: obtaining and reviewing outside history, ordering and reviewing medications, tests or procedures, care coordination (communications with other health care professionals or caregivers) and documentation in the medical record.

## 2019-10-19 ENCOUNTER — Ambulatory Visit: Payer: Medicare Other

## 2019-10-19 ENCOUNTER — Inpatient Hospital Stay (HOSPITAL_BASED_OUTPATIENT_CLINIC_OR_DEPARTMENT_OTHER): Payer: Medicare Other | Admitting: Adult Health

## 2019-10-19 ENCOUNTER — Encounter: Payer: Self-pay | Admitting: Adult Health

## 2019-10-19 ENCOUNTER — Other Ambulatory Visit: Payer: Self-pay

## 2019-10-19 ENCOUNTER — Inpatient Hospital Stay: Payer: Medicare Other | Attending: Oncology

## 2019-10-19 ENCOUNTER — Inpatient Hospital Stay: Payer: Medicare Other

## 2019-10-19 VITALS — BP 108/68 | HR 95 | Temp 98.2°F | Resp 18 | Ht 64.5 in | Wt 138.9 lb

## 2019-10-19 DIAGNOSIS — I313 Pericardial effusion (noninflammatory): Secondary | ICD-10-CM | POA: Diagnosis not present

## 2019-10-19 DIAGNOSIS — C787 Secondary malignant neoplasm of liver and intrahepatic bile duct: Secondary | ICD-10-CM

## 2019-10-19 DIAGNOSIS — C7951 Secondary malignant neoplasm of bone: Secondary | ICD-10-CM

## 2019-10-19 DIAGNOSIS — C7800 Secondary malignant neoplasm of unspecified lung: Secondary | ICD-10-CM

## 2019-10-19 DIAGNOSIS — C50911 Malignant neoplasm of unspecified site of right female breast: Secondary | ICD-10-CM

## 2019-10-19 DIAGNOSIS — C50411 Malignant neoplasm of upper-outer quadrant of right female breast: Secondary | ICD-10-CM | POA: Insufficient documentation

## 2019-10-19 DIAGNOSIS — C801 Malignant (primary) neoplasm, unspecified: Secondary | ICD-10-CM | POA: Diagnosis not present

## 2019-10-19 DIAGNOSIS — Z5111 Encounter for antineoplastic chemotherapy: Secondary | ICD-10-CM | POA: Diagnosis not present

## 2019-10-19 DIAGNOSIS — Z170 Estrogen receptor positive status [ER+]: Secondary | ICD-10-CM

## 2019-10-19 DIAGNOSIS — I3131 Malignant pericardial effusion in diseases classified elsewhere: Secondary | ICD-10-CM

## 2019-10-19 LAB — COMPREHENSIVE METABOLIC PANEL
ALT: 12 U/L (ref 0–44)
AST: 14 U/L — ABNORMAL LOW (ref 15–41)
Albumin: 3.5 g/dL (ref 3.5–5.0)
Alkaline Phosphatase: 81 U/L (ref 38–126)
Anion gap: 10 (ref 5–15)
BUN: 25 mg/dL — ABNORMAL HIGH (ref 8–23)
CO2: 25 mmol/L (ref 22–32)
Calcium: 10 mg/dL (ref 8.9–10.3)
Chloride: 107 mmol/L (ref 98–111)
Creatinine, Ser: 1.18 mg/dL — ABNORMAL HIGH (ref 0.44–1.00)
GFR calc Af Amer: 55 mL/min — ABNORMAL LOW (ref >60)
GFR calc non Af Amer: 47 mL/min — ABNORMAL LOW (ref >60)
Glucose, Bld: 93 mg/dL (ref 70–99)
Potassium: 4.8 mmol/L (ref 3.5–5.1)
Sodium: 142 mmol/L (ref 135–145)
Total Bilirubin: 0.3 mg/dL (ref 0.3–1.2)
Total Protein: 6.8 g/dL (ref 6.5–8.1)

## 2019-10-19 LAB — CBC WITH DIFFERENTIAL/PLATELET
Abs Immature Granulocytes: 0.02 10*3/uL (ref 0.00–0.07)
Basophils Absolute: 0.1 10*3/uL (ref 0.0–0.1)
Basophils Relative: 2 %
Eosinophils Absolute: 0 10*3/uL (ref 0.0–0.5)
Eosinophils Relative: 0 %
HCT: 42.6 % (ref 36.0–46.0)
Hemoglobin: 14 g/dL (ref 12.0–15.0)
Immature Granulocytes: 0 %
Lymphocytes Relative: 17 %
Lymphs Abs: 0.9 10*3/uL (ref 0.7–4.0)
MCH: 30.8 pg (ref 26.0–34.0)
MCHC: 32.9 g/dL (ref 30.0–36.0)
MCV: 93.6 fL (ref 80.0–100.0)
Monocytes Absolute: 0.7 10*3/uL (ref 0.1–1.0)
Monocytes Relative: 14 %
Neutro Abs: 3.3 10*3/uL (ref 1.7–7.7)
Neutrophils Relative %: 67 %
Platelets: 179 10*3/uL (ref 150–400)
RBC: 4.55 MIL/uL (ref 3.87–5.11)
RDW: 23.2 % — ABNORMAL HIGH (ref 11.5–15.5)
WBC: 4.9 10*3/uL (ref 4.0–10.5)
nRBC: 0 % (ref 0.0–0.2)

## 2019-10-19 MED ORDER — FULVESTRANT 250 MG/5ML IM SOLN
500.0000 mg | Freq: Once | INTRAMUSCULAR | Status: AC
  Administered 2019-10-19: 500 mg via INTRAMUSCULAR

## 2019-10-19 MED ORDER — DENOSUMAB 120 MG/1.7ML ~~LOC~~ SOLN
120.0000 mg | Freq: Once | SUBCUTANEOUS | Status: AC
  Administered 2019-10-19: 120 mg via SUBCUTANEOUS

## 2019-10-19 MED ORDER — DENOSUMAB 120 MG/1.7ML ~~LOC~~ SOLN
SUBCUTANEOUS | Status: AC
  Filled 2019-10-19: qty 1.7

## 2019-10-19 NOTE — Patient Instructions (Signed)
Fulvestrant injection What is this medicine? FULVESTRANT (ful VES trant) blocks the effects of estrogen. It is used to treat breast cancer. This medicine may be used for other purposes; ask your health care provider or pharmacist if you have questions. COMMON BRAND NAME(S): FASLODEX What should I tell my health care provider before I take this medicine? They need to know if you have any of these conditions:  bleeding disorders  liver disease  low blood counts, like low white cell, platelet, or red cell counts  an unusual or allergic reaction to fulvestrant, other medicines, foods, dyes, or preservatives  pregnant or trying to get pregnant  breast-feeding How should I use this medicine? This medicine is for injection into a muscle. It is usually given by a health care professional in a hospital or clinic setting. Talk to your pediatrician regarding the use of this medicine in children. Special care may be needed. Overdosage: If you think you have taken too much of this medicine contact a poison control center or emergency room at once. NOTE: This medicine is only for you. Do not share this medicine with others. What if I miss a dose? It is important not to miss your dose. Call your doctor or health care professional if you are unable to keep an appointment. What may interact with this medicine?  medicines that treat or prevent blood clots like warfarin, enoxaparin, dalteparin, apixaban, dabigatran, and rivaroxaban This list may not describe all possible interactions. Give your health care provider a list of all the medicines, herbs, non-prescription drugs, or dietary supplements you use. Also tell them if you smoke, drink alcohol, or use illegal drugs. Some items may interact with your medicine. What should I watch for while using this medicine? Your condition will be monitored carefully while you are receiving this medicine. You will need important blood work done while you are taking  this medicine. Do not become pregnant while taking this medicine or for at least 1 year after stopping it. Women of child-bearing potential will need to have a negative pregnancy test before starting this medicine. Women should inform their doctor if they wish to become pregnant or think they might be pregnant. There is a potential for serious side effects to an unborn child. Men should inform their doctors if they wish to father a child. This medicine may lower sperm counts. Talk to your health care professional or pharmacist for more information. Do not breast-feed an infant while taking this medicine or for 1 year after the last dose. What side effects may I notice from receiving this medicine? Side effects that you should report to your doctor or health care professional as soon as possible:  allergic reactions like skin rash, itching or hives, swelling of the face, lips, or tongue  feeling faint or lightheaded, falls  pain, tingling, numbness, or weakness in the legs  signs and symptoms of infection like fever or chills; cough; flu-like symptoms; sore throat  vaginal bleeding Side effects that usually do not require medical attention (report to your doctor or health care professional if they continue or are bothersome):  aches, pains  constipation  diarrhea  headache  hot flashes  nausea, vomiting  pain at site where injected  stomach pain This list may not describe all possible side effects. Call your doctor for medical advice about side effects. You may report side effects to FDA at 1-800-FDA-1088. Where should I keep my medicine? This drug is given in a hospital or clinic and will  not be stored at home. NOTE: This sheet is a summary. It may not cover all possible information. If you have questions about this medicine, talk to your doctor, pharmacist, or health care provider.  2020 [employer] Standard (2017-10-23 11:34:41) Denosumab injection What is this  medicine? DENOSUMAB (den oh sue mab) slows bone breakdown. Prolia is used to treat osteoporosis in women after menopause and in men, and in people who are taking corticosteroids for 6 months or more. Delton See is used to treat a high calcium level due to cancer and to prevent bone fractures and other bone problems caused by multiple myeloma or cancer bone metastases. Delton See is also used to treat giant cell tumor of the bone. This medicine may be used for other purposes; ask your health care provider or pharmacist if you have questions. COMMON BRAND NAME(S): Prolia, XGEVA What should I tell my health care provider before I take this medicine? They need to know if you have any of these conditions:  dental disease  having surgery or tooth extraction  infection  kidney disease  low levels of calcium or Vitamin D in the blood  malnutrition  on hemodialysis  skin conditions or sensitivity  thyroid or parathyroid disease  an unusual reaction to denosumab, other medicines, foods, dyes, or preservatives  pregnant or trying to get pregnant  breast-feeding How should I use this medicine? This medicine is for injection under the skin. It is given by a health care professional in a hospital or clinic setting. A special MedGuide will be given to you before each treatment. Be sure to read this information carefully each time. For Prolia, talk to your pediatrician regarding the use of this medicine in children. Special care may be needed. For Delton See, talk to your pediatrician regarding the use of this medicine in children. While this drug may be prescribed for children as young as 13 years for selected conditions, precautions do apply. Overdosage: If you think you have taken too much of this medicine contact a poison control center or emergency room at once. NOTE: This medicine is only for you. Do not share this medicine with others. What if I miss a dose? It is important not to miss your dose. Call  your doctor or health care professional if you are unable to keep an appointment. What may interact with this medicine? Do not take this medicine with any of the following medications:  other medicines containing denosumab This medicine may also interact with the following medications:  medicines that lower your chance of fighting infection  steroid medicines like prednisone or cortisone This list may not describe all possible interactions. Give your health care provider a list of all the medicines, herbs, non-prescription drugs, or dietary supplements you use. Also tell them if you smoke, drink alcohol, or use illegal drugs. Some items may interact with your medicine. What should I watch for while using this medicine? Visit your doctor or health care professional for regular checks on your progress. Your doctor or health care professional may order blood tests and other tests to see how you are doing. Call your doctor or health care professional for advice if you get a fever, chills or sore throat, or other symptoms of a cold or flu. Do not treat yourself. This drug may decrease your body's ability to fight infection. Try to avoid being around people who are sick. You should make sure you get enough calcium and vitamin D while you are taking this medicine, unless your doctor tells  you not to. Discuss the foods you eat and the vitamins you take with your health care professional. See your dentist regularly. Brush and floss your teeth as directed. Before you have any dental work done, tell your dentist you are receiving this medicine. Do not become pregnant while taking this medicine or for 5 months after stopping it. Talk with your doctor or health care professional about your birth control options while taking this medicine. Women should inform their doctor if they wish to become pregnant or think they might be pregnant. There is a potential for serious side effects to an unborn child. Talk to your  health care professional or pharmacist for more information. What side effects may I notice from receiving this medicine? Side effects that you should report to your doctor or health care professional as soon as possible:  allergic reactions like skin rash, itching or hives, swelling of the face, lips, or tongue  bone pain  breathing problems  dizziness  jaw pain, especially after dental work  redness, blistering, peeling of the skin  signs and symptoms of infection like fever or chills; cough; sore throat; pain or trouble passing urine  signs of low calcium like fast heartbeat, muscle cramps or muscle pain; pain, tingling, numbness in the hands or feet; seizures  unusual bleeding or bruising  unusually weak or tired Side effects that usually do not require medical attention (report to your doctor or health care professional if they continue or are bothersome):  constipation  diarrhea  headache  joint pain  loss of appetite  muscle pain  runny nose  tiredness  upset stomach This list may not describe all possible side effects. Call your doctor for medical advice about side effects. You may report side effects to FDA at 1-800-FDA-1088. Where should I keep my medicine? This medicine is only given in a clinic, doctor's office, or other health care setting and will not be stored at home. NOTE: This sheet is a summary. It may not cover all possible information. If you have questions about this medicine, talk to your doctor, pharmacist, or health care provider.  2020 [employer] Standard (2017-11-21 16:10:44)  

## 2019-10-20 ENCOUNTER — Telehealth: Payer: Self-pay | Admitting: Adult Health

## 2019-10-20 NOTE — Telephone Encounter (Signed)
No 2/23 los, no changes were made to pt's schedule.

## 2019-10-22 IMAGING — MG DIGITAL SCREENING UNILATERAL LEFT MAMMOGRAM WITH CAD AND TOMO
4 series · 4 of 12 positions shown · non-contrast
Comparison: Previous exam(s).

CLINICAL DATA: Screening. History of right mastectomy.

EXAM:
DIGITAL SCREENING UNILATERAL LEFT MAMMOGRAM WITH CAD AND TOMO

[L CC synth-2D]
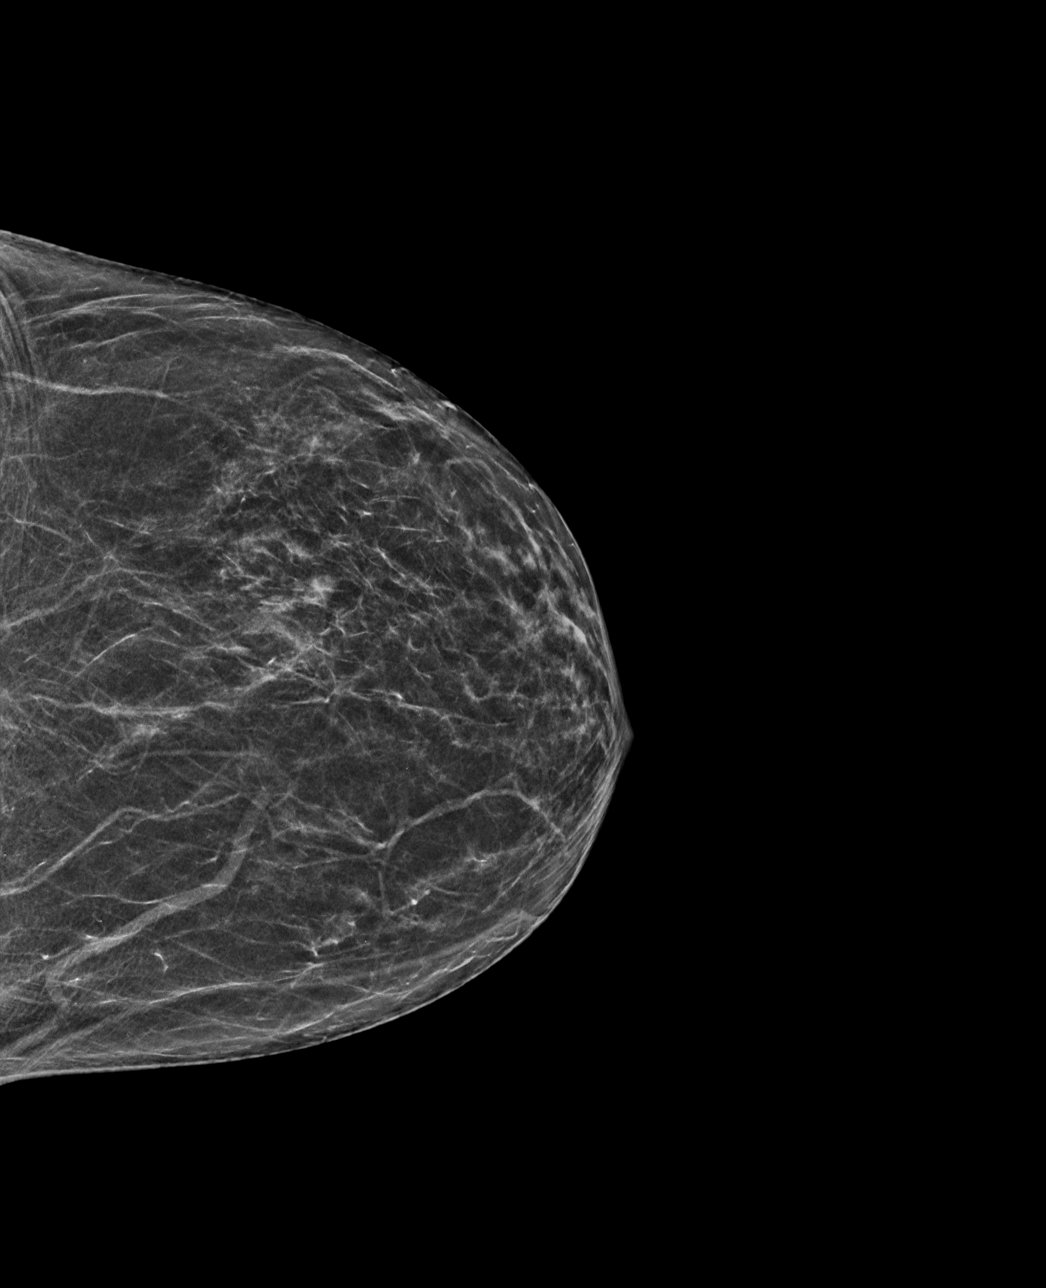

[L MLO synth-2D]
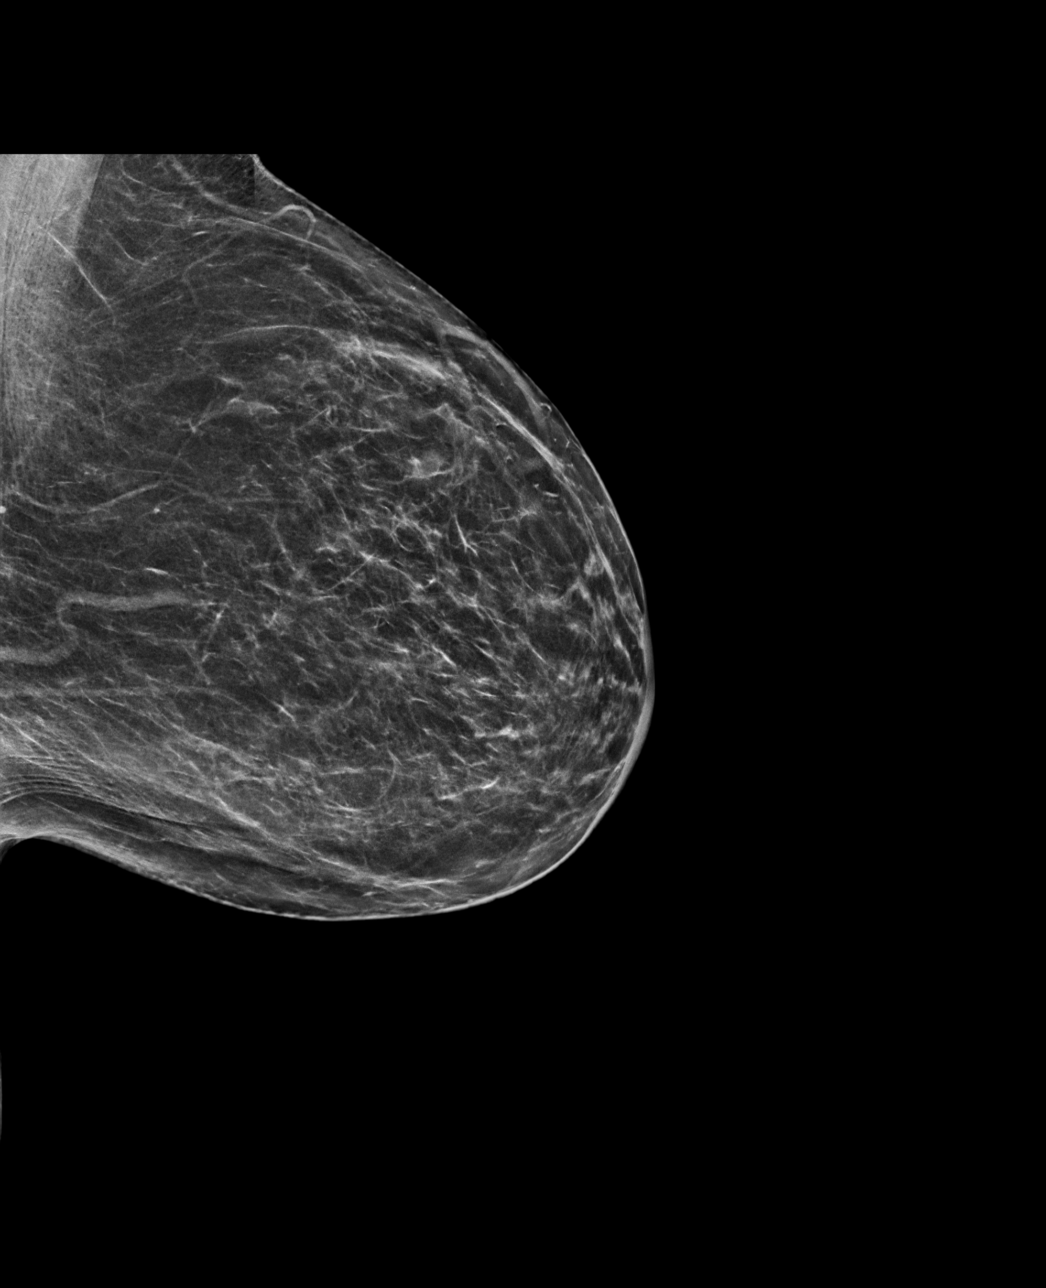

[L CC tomo · tomo slice 29/58.0]
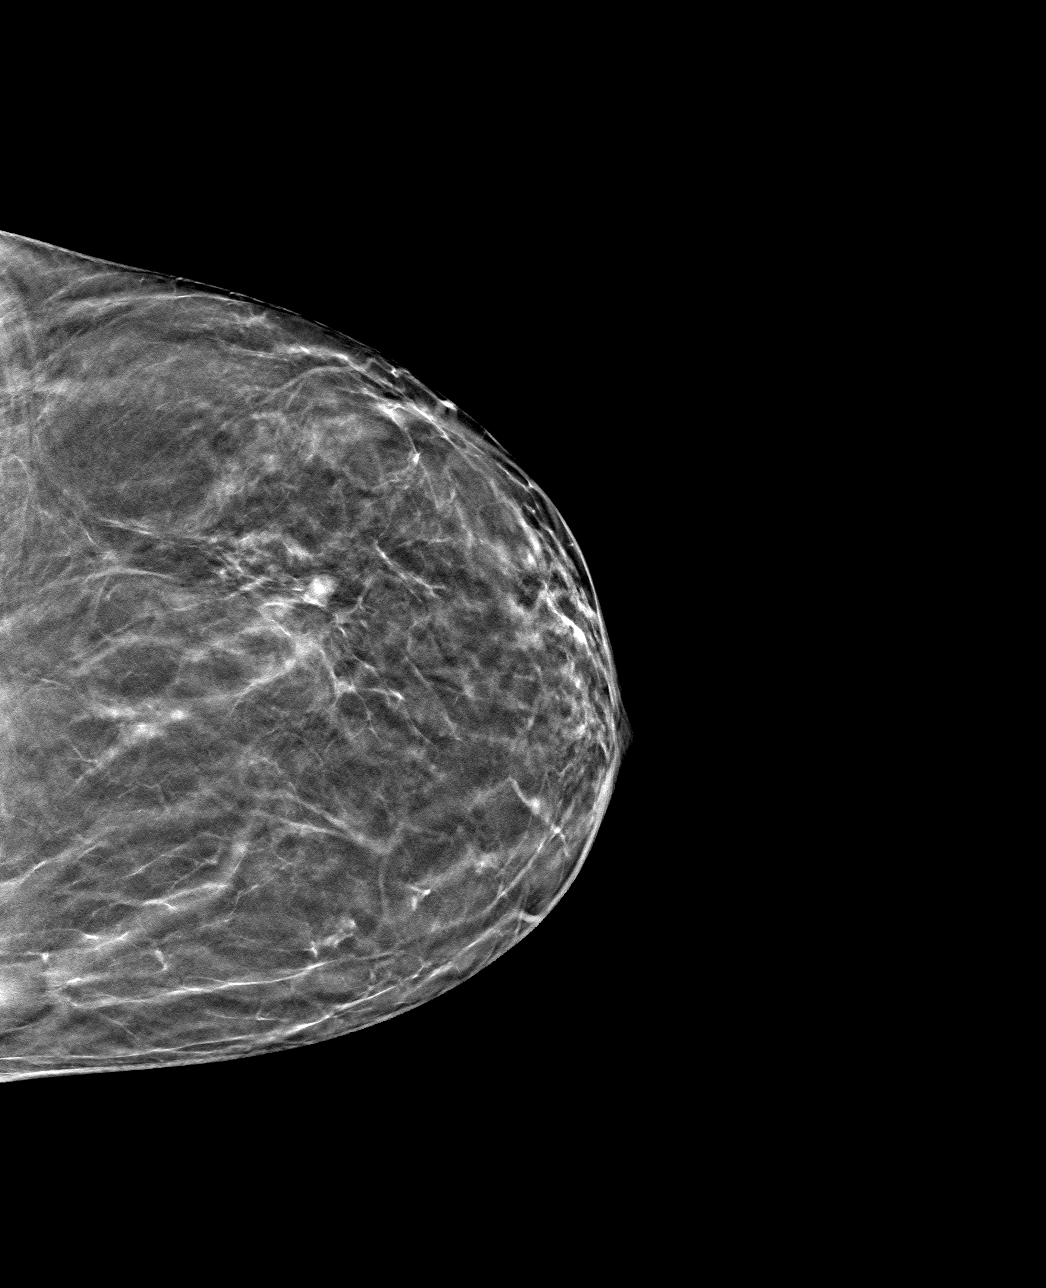

[L MLO tomo · tomo slice 33/64.0]
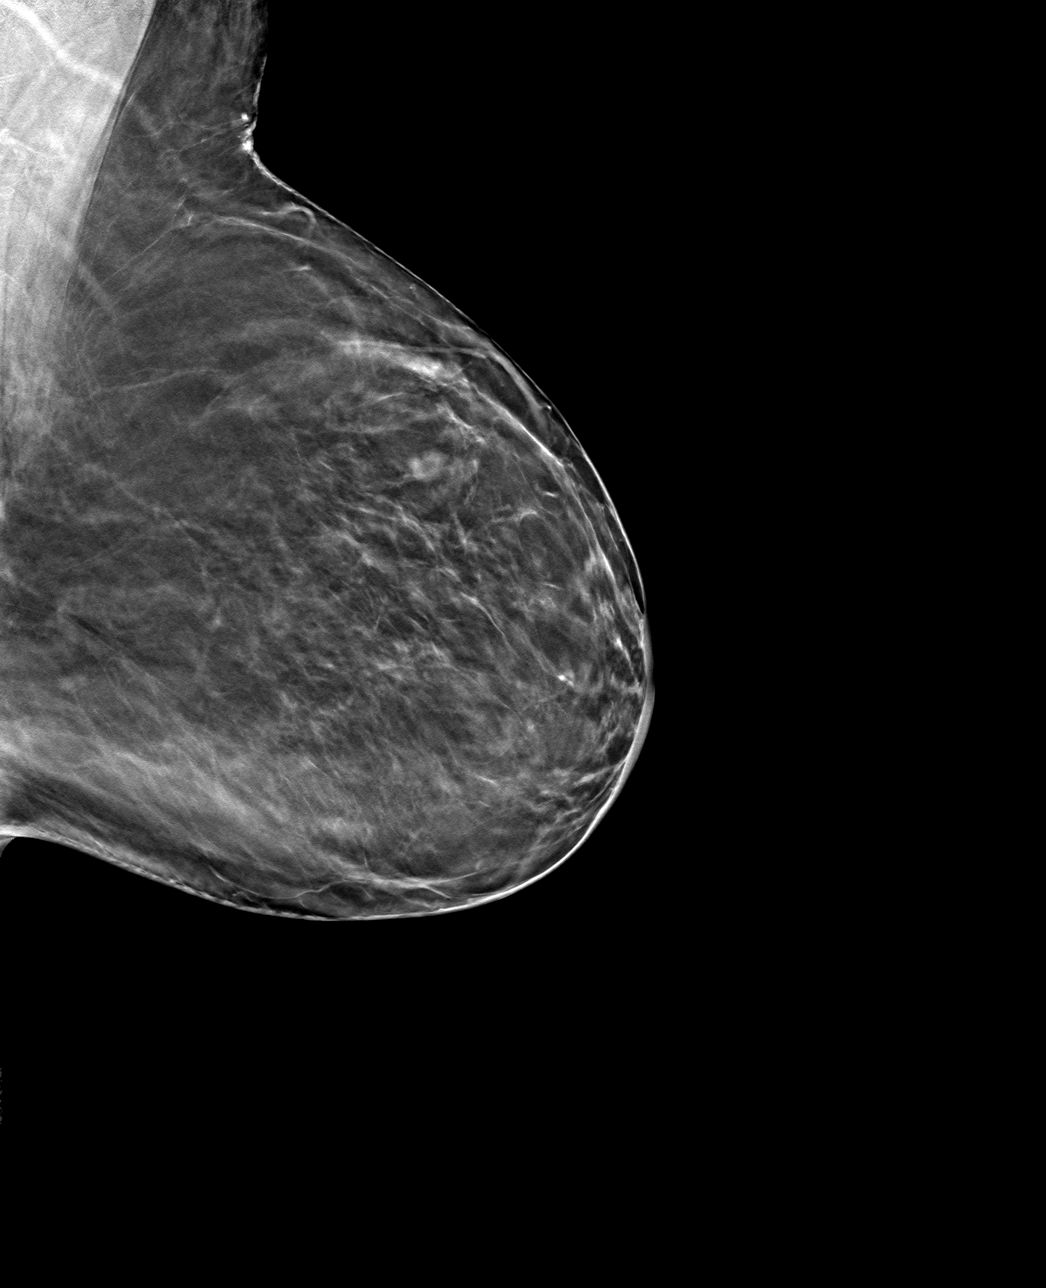

[4 of 12 positions shown; findings below may reference images not displayed]

ACR Breast Density Category b: There are scattered areas of
fibroglandular density.
FINDINGS: There are no findings suspicious for malignancy. Images were
processed with CAD.
IMPRESSION: No mammographic evidence of malignancy. A result letter of this
screening mammogram will be mailed directly to the patient.

RECOMMENDATION:
Screening mammogram in one year. (Code:[id number])

BI-RADS CATEGORY  1: Negative.

## 2019-11-10 ENCOUNTER — Other Ambulatory Visit: Payer: Self-pay

## 2019-11-10 ENCOUNTER — Ambulatory Visit (HOSPITAL_COMMUNITY)
Admission: RE | Admit: 2019-11-10 | Discharge: 2019-11-10 | Disposition: A | Discharge status: Home / Self Care | Payer: Medicare Other | Source: Ambulatory Visit | Attending: Adult Health | Admitting: Adult Health

## 2019-11-10 DIAGNOSIS — C50919 Malignant neoplasm of unspecified site of unspecified female breast: Secondary | ICD-10-CM | POA: Diagnosis not present

## 2019-11-10 DIAGNOSIS — C50911 Malignant neoplasm of unspecified site of right female breast: Secondary | ICD-10-CM | POA: Insufficient documentation

## 2019-11-10 DIAGNOSIS — C7800 Secondary malignant neoplasm of unspecified lung: Secondary | ICD-10-CM | POA: Insufficient documentation

## 2019-11-10 MED ORDER — GADOBUTROL 1 MMOL/ML IV SOLN
6.0000 mL | Freq: Once | INTRAVENOUS | Status: AC | PRN
  Administered 2019-11-10: 6 mL via INTRAVENOUS

## 2019-11-11 ENCOUNTER — Encounter (HOSPITAL_COMMUNITY): Payer: Self-pay

## 2019-11-11 ENCOUNTER — Ambulatory Visit (HOSPITAL_COMMUNITY)
Admission: RE | Admit: 2019-11-11 | Discharge: 2019-11-11 | Disposition: A | Discharge status: Home / Self Care | Payer: Medicare Other | Source: Ambulatory Visit | Attending: Adult Health | Admitting: Adult Health

## 2019-11-11 DIAGNOSIS — C50911 Malignant neoplasm of unspecified site of right female breast: Secondary | ICD-10-CM | POA: Diagnosis not present

## 2019-11-11 DIAGNOSIS — C7800 Secondary malignant neoplasm of unspecified lung: Secondary | ICD-10-CM | POA: Diagnosis not present

## 2019-11-11 DIAGNOSIS — C7951 Secondary malignant neoplasm of bone: Secondary | ICD-10-CM | POA: Diagnosis not present

## 2019-11-11 DIAGNOSIS — C50919 Malignant neoplasm of unspecified site of unspecified female breast: Secondary | ICD-10-CM | POA: Diagnosis not present

## 2019-11-11 MED ORDER — TECHNETIUM TC 99M MEDRONATE IV KIT
21.0000 | PACK | Freq: Once | INTRAVENOUS | Status: AC | PRN
  Administered 2019-11-11: 09:00:00 21 via INTRAVENOUS

## 2019-11-11 MED ORDER — IOHEXOL 300 MG/ML  SOLN
75.0000 mL | Freq: Once | INTRAMUSCULAR | Status: AC | PRN
  Administered 2019-11-11: 75 mL via INTRAVENOUS

## 2019-11-11 MED ORDER — SODIUM CHLORIDE (PF) 0.9 % IJ SOLN
INTRAMUSCULAR | Status: AC
  Filled 2019-11-11: qty 50

## 2019-11-15 NOTE — Progress Notes (Signed)
[place]  [phone] Fax:([phone]) [phone]    ID: Barbara Parks DOB: 1951-02-06  MR#: [id number]  SWN#:[id number]  Patient Care Team: [name], MD as PCP - General (Family Medicine) Jerline Pain, MD (Cardiology) Gaye Pollack, MD (Cardiothoracic Surgery) Saniya Tranchina, Virgie Dad, MD as Consulting Physician (Oncology) OTHER MD: Dr Erroll Luna- Merlene Laughter, DDS; Gaynelle Arabian MD   CHIEF COMPLAINT: metastatic breast cancer, formerly on BOLERO study  CURRENT TREATMENT: Fulvestrant, palbociclib, denosumab/Xgeva   INTERVAL HISTORY: Barbara Parks returns today for follow-up and treatment of her metastatic breast cancer.  She was changed to Fulvestrant.and Palbociclib, starting 08/24/2019 and continues on Xgeva.  She receives fulvestrant every 4 weeks.  She tolerates this well.    She also continues on Palbociclib. She gets this by mail, at no charge currently.  She had some fatigue initially on this medication but that has resolved and now she is pretty much at baseline in terms of activity.  She is due for [facility] today and continues to tolerate this well.    Since her last visit, she underwent restaging scans. Liver MRI performed on 11/10/2019 showed: slight decrease in size of hepatic metastatic lesions; small right effusion, increased from prior; increased visibility of bony lesions; stable pancreatic and biliary ductal dilation.  Chest CT performed on 11/11/2019 showed: smmall right pleural effusion, increased from prior, with some enhancement along the parietal pleural margin; stable, continued obstruction of superior bronchus of right lower lobe; unchanged lytic lesions.  Bone scan performed the same day showed stable previously demonstrated metastatic lesions with no new lesions.   REVIEW OF SYSTEMS: Barbara Parks is gardening, golfing, washing her car, and walking her dogs.  She feels "good".  She is gained some weight, which she does not like.  She has a feeling  of burning in the right groin area which happen several times a day and which she says really only started when she her first fulvestrant dose.  Aside from these issues a detailed review of systems today was stable  BREAST CANCER HISTORY: From Dr. Collier [name]'s original intake note 04/13/2004:  "This woman has been in good health all of her life. She recently moved from [place] to work here.  She palpated a mass at about the 12 o'clock position in mid-July. She has not noticed any nipple retraction or skin changes.  She was seen by her primary care doctor who subsequently referred her for a mammogram.  Mammogram was performed on 03/01/04. This demonstrated a spiculated 2 cm mass at the 12 o'clock position in the right breast.  Upper outer quadrant of the left breast shows some distortion.   Physical exam at that time showed a firm, nontender nodule at the 12 o'clock position in the right breast, 5 cm from the nipple.  Ultrasound of this area showed a hypoechoic ill-defined mass, measuring 2.2 x 1.3 x 1.4 cm.  Physical exam of the left breast showed general vague thickening, upper outer quadrant of the left breast with a discrete palpable mass. The ultrasound performed showed a single hypoechoic ill-defined nodule at the 1 o'clock position, measuring 7 x 5 x 6 mm.    She had biopsies of both lesions on 03/02/03.  Needle core biopsy of the lesion on the right breast revealed invasive mammary carcinoma.  Needle core biopsy of the left breast showed a complex fibroadenoma.  Prognostic panel of the lesion on the right breast showed it to be ER positive at 73%, PR positive at 90% and  proliferative index 9%, HER-2 was 1+.  Patient was referred to Dr. Annamaria Boots, who performed a simple mastectomy with sentinel lymph node evaluation on 03/22/04.  Final pathology showed this to be an invasive ductal carcinoma  with lobular features, measuring 2.2 cm, grade 2 of 3.  Margins negative for carcinoma.  Invasive ductal carcinoma  was extended to involve deep dermis of the nipple.  Lymphovascular invasion was identified.  Total of 4 sentinel lymph nodes were evaluated.  Touch imprints at the time of the OR was felt to be negative.  Subsequent evaluation showed a 5 mm focus of metastatic carcinoma in one of the four lymph nodes on microscopic after sectioning.  There was extracapsular extension  of one lymph node as well. The remaining three lymph nodes were all negative."  The patient's subsequent history is detailed above.   PAST MEDICAL HISTORY: Past Medical History:  Diagnosis Date  . Arthritis    left hip  . Asthma   . breast ca 2005   breast/chemo R mastectomy  . Dermatitis   . Diabetes mellitus without complication ([place])   . GERD (gastroesophageal reflux disease)   . Hypercholesteremia   . Metastasis to lung ([place]) dx'd 08/2011  . Osteopenia due to cancer therapy 09/09/2013  . Palpitations   . Personal history of chemotherapy   . Shortness of breath     PAST SURGICAL HISTORY: Past Surgical History:  Procedure Laterality Date  . BREAST SURGERY  2005   right  . CATARACT EXTRACTION W/PHACO Left 01/18/2014   Procedure: CATARACT EXTRACTION PHACO AND INTRAOCULAR LENS PLACEMENT (IOC);  Surgeon: Elta Guadeloupe T. Gershon Crane, MD;  Location: [facility];  Service: Ophthalmology;  Laterality: Left;  CDE 15.79  . CATARACT EXTRACTION W/PHACO Right 02/08/2014   Procedure: CATARACT EXTRACTION PHACO AND INTRAOCULAR LENS PLACEMENT (IOC);  Surgeon: Elta Guadeloupe T. Gershon Crane, MD;  Location: [facility];  Service: Ophthalmology;  Laterality: Right;  CDE 4.41  . CHEST TUBE INSERTION  09/11/2011   Procedure: INSERTION PLEURAL DRAINAGE CATHETER;  Surgeon: Gaye Pollack, MD;  Location: [place];  Service: Thoracic;  Laterality: Right;  . MASTECTOMY Right   . PERICARDIAL WINDOW  09/11/2011   Procedure: PERICARDIAL WINDOW;  Surgeon: Gaye Pollack, MD;  Location[facility] OR;  Service: Thoracic;  Laterality: N/A;  . REMOVAL OF PLEURAL DRAINAGE CATHETER  12/19/2011    Procedure: REMOVAL OF PLEURAL DRAINAGE CATHETER;  Surgeon: Gaye Pollack, MD;  Location: [place];  Service: Thoracic;  Laterality: Right;  TO BE DONE IN MINOR ROOM, SHORT STAY  . [place]  . TOTAL HIP ARTHROPLASTY Left 06/07/2015   Procedure: LEFT TOTAL HIP ARTHROPLASTY ANTERIOR APPROACH;  Surgeon: Gaynelle Arabian, MD;  Location: [facility];  Service: Orthopedics;  Laterality: Left;  Marland Kitchen VIDEO BRONCHOSCOPY  09/11/2011   Procedure: VIDEO BRONCHOSCOPY;  Surgeon: Gaye Pollack, MD;  Location: [facility] OR;  Service: Thoracic;  Laterality: N/A;    FAMILY HISTORY Family History  Problem Relation Age of Onset  . Cancer Mother        breast  . Heart disease Father   . Diabetes Other   . Anesthesia problems Neg Hx    The patient's mother was diagnosed with breast cancer at age 66, she died age 15 from congestive heart failure.The patient's father died from heart disease at age 66.  She has one sister alive & well.  Two brothers alive & well, one with diabetes. The patient's sister was also diagnosed with breast cancer, and was tested  for the BRCA gene, and was negative. The patient herself has not been tested. There is no history of ovarian cancer in the family   GYNECOLOGIC HISTORY:  No LMP recorded. Patient is postmenopausal. Menarche age 52, the patient is GX P0. She stopped having periods with chemotherapy in 2005. 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 She was a Archivist. She is single, lives alone with her Shitzu-poodle Sammie.. Family is all in the [place] area.     ADVANCED DIRECTIVES: Not in place. At the 02/23/2014 visit the patient was given the appropriate documents to complete and notarize at her discretion. She tells me she is planning to name her sister, Billie Ruddy, as healthcare power of attorney. [name] can be reached at [phone]   HEALTH  MAINTENANCE: Social History   Tobacco Use  . Smoking status: Former Smoker    Packs/day: 1.50    Years: 30.00    Pack years: 45.00    Types: Cigarettes    Quit date: 09/10/2007    Years since quitting: 12.1  . Smokeless tobacco: Never Used  Substance Use Topics  . Alcohol use: No  . Drug use: No    Colonoscopy:  PAP:  Bone density: 04/09/2018, T score -1.8  Lipid panel:  Allergies  Allergen Reactions  . Aspirin     REACTION: upset stomach  . Latex Other (See Comments)    Blistering and skin peels off  . Nsaids Nausea And Vomiting    Extreme nausea and vomiting    Current Outpatient Medications  Medication Sig Dispense Refill  . cholecalciferol (VITAMIN D3) 25 MCG (1000 UT) tablet Take 2 tablets (2,000 Units total) by mouth daily. 180 tablet 4  . cyclobenzaprine (FLEXERIL) 10 MG tablet Take 1 tablet (10 mg total) by mouth 2 (two) times daily as needed for muscle spasms. 20 tablet 0  . gemfibrozil (LOPID) 600 MG tablet TAKE 1 TABLET (600 MG TOTAL) BY MOUTH 2 (TWO) TIMES DAILY BEFORE A MEAL. 180 tablet 1  . loperamide (IMODIUM) 2 MG capsule Take 2 mg by mouth 4 (four) times daily as needed for diarrhea or loose stools. Reported on 11/30/2015    . metFORMIN (GLUCOPHAGE-XR) 500 MG 24 hr tablet Take 1 tablet (500 mg total) by mouth 3 (three) times daily before meals.  3  . palbociclib (IBRANCE) 125 MG tablet Take 1 tablet (125 mg total) by mouth daily. Take for 21 days on, 7 days off, repeat every 28 days. 21 tablet 6  . traMADol (ULTRAM) 50 MG tablet Take 0.5-1 tablets (25-50 mg total) by mouth every 6 (six) hours as needed. 30 tablet 0   No current facility-administered medications for this visit.    OBJECTIVE: White woman who appears stated age 7:   11/16/19 1325  BP: 106/81  Pulse: 97  Resp: 18  Temp: 98.5 F (36.9 C)  SpO2: 97%     Body mass index is 23.98 kg/m.   Filed Weights   11/16/19 1325  Weight: 141 lb 14.4 oz (64.4 kg)   ECOG FS:1 - Symptomatic but  completely ambulatory  Sclerae unicteric, EOMs intact Wearing a mask No cervical or supraclavicular adenopathy Lungs no rales or rhonchi Heart regular rate and rhythm Abd soft, nontender, positive bowel sounds MSK no focal spinal tenderness, walks with a slight limp Neuro: nonfocal, well oriented, appropriate affect Breasts: The right breast  is status post mastectomy.  There is no evidence of local recurrence.  The left breast is benign.  Both axillae are benign.   LAB  RESULTS:  CMP     Component Value Date/Time   NA 139 11/16/2019 1314   NA 139 07/08/2017 0942   K 4.5 11/16/2019 1314   K 3.8 07/08/2017 0942   CL 107 11/16/2019 1314   CL 109 (H) 12/31/2012 0940   CO2 23 11/16/2019 1314   CO2 18 (L) 07/08/2017 0942   GLUCOSE 98 11/16/2019 1314   GLUCOSE 156 (H) 07/08/2017 0942   GLUCOSE 127 (H) 12/31/2012 0940   BUN 24 (H) 11/16/2019 1314   BUN 17.7 07/08/2017 0942   CREATININE 1.17 (H) 11/16/2019 1314   CREATININE 1.18 (H) 12/25/2017 0959   CREATININE 1.0 07/08/2017 0942   CALCIUM 9.4 11/16/2019 1314   CALCIUM 10.4 07/08/2017 0942   PROT 7.2 11/16/2019 1314   PROT 7.7 07/08/2017 0942   ALBUMIN 3.6 11/16/2019 1314   ALBUMIN 3.4 (L) 07/08/2017 0942   AST 15 11/16/2019 1314   AST 50 (H) 12/25/2017 0959   AST 54 (H) 07/08/2017 0942   ALT 13 11/16/2019 1314   ALT 53 12/25/2017 0959   ALT 39 07/08/2017 0942   ALKPHOS 71 11/16/2019 1314   ALKPHOS 232 (H) 07/08/2017 0942   BILITOT 0.3 11/16/2019 1314   BILITOT 0.3 12/25/2017 0959   BILITOT 0.42 07/08/2017 0942   GFRNONAA 48 (L) 11/16/2019 1314   GFRNONAA 47 (L) 12/25/2017 0959   GFRAA 55 (L) 11/16/2019 1314   GFRAA 54 (L) 12/25/2017 0959    I No results found for: SPEP  Lab Results  Component Value Date   WBC 4.8 11/16/2019   NEUTROABS 3.1 11/16/2019   HGB 14.2 11/16/2019   HCT 42.8 11/16/2019   MCV 98.4 11/16/2019   PLT 165 11/16/2019      Chemistry      Component Value Date/Time   NA 139 11/16/2019  1314   NA 139 07/08/2017 0942   K 4.5 11/16/2019 1314   K 3.8 07/08/2017 0942   CL 107 11/16/2019 1314   CL 109 (H) 12/31/2012 0940   CO2 23 11/16/2019 1314   CO2 18 (L) 07/08/2017 0942   BUN 24 (H) 11/16/2019 1314   BUN 17.7 07/08/2017 0942   CREATININE 1.17 (H) 11/16/2019 1314   CREATININE 1.18 (H) 12/25/2017 0959   CREATININE 1.0 07/08/2017 0942      Component Value Date/Time   CALCIUM 9.4 11/16/2019 1314   CALCIUM 10.4 07/08/2017 0942   ALKPHOS 71 11/16/2019 1314   ALKPHOS 232 (H) 07/08/2017 0942   AST 15 11/16/2019 1314   AST 50 (H) 12/25/2017 0959   AST 54 (H) 07/08/2017 0942   ALT 13 11/16/2019 1314   ALT 53 12/25/2017 0959   ALT 39 07/08/2017 0942   BILITOT 0.3 11/16/2019 1314   BILITOT 0.3 12/25/2017 0959   BILITOT 0.42 07/08/2017 0942       Lab Results  Component Value Date   LABCA2 58 (H) 09/14/2012    No components found for: [id number]  No results for input(s): INR in the last 168 hours.  Urinalysis    Component Value Date/Time   COLORURINE YELLOW 11/25/2017 0901   APPEARANCEUR HAZY (A) 11/25/2017 0901   LABSPEC 1.017 11/25/2017 0901   LABSPEC 1.030 07/13/2015 0841   PHURINE 5.0 11/25/2017 0901   GLUCOSEU NEGATIVE 11/25/2017 0901   GLUCOSEU Negative 07/13/2015 0841   HGBUR MODERATE (A)  11/25/2017 0901   BILIRUBINUR NEGATIVE 11/25/2017 0901   BILIRUBINUR Negative 07/13/2015 0841   KETONESUR NEGATIVE 11/25/2017 0901   PROTEINUR 100 (A) 11/25/2017 0901   UROBILINOGEN 0.2 07/13/2015 0841   NITRITE NEGATIVE 11/25/2017 0901   LEUKOCYTESUR NEGATIVE 11/25/2017 0901   LEUKOCYTESUR Negative 07/13/2015 0841    STUDIES: CT Chest W Contrast  Result Date: 11/11/2019 CLINICAL DATA:  Breast cancer restaging. EXAM: CT CHEST WITH CONTRAST TECHNIQUE: Multidetector CT imaging of the chest was performed during intravenous contrast administration. CONTRAST:  61m OMNIPAQUE IOHEXOL 300 MG/ML  SOLN COMPARISON:  MRI abdomen from 11/10/2019; chest CT 07/09/2019  FINDINGS: Cardiovascular: Atherosclerotic aortic arch. Mediastinum/Nodes: Right paratracheal node 0.8 cm in short axis on image 38/2, stable. This has somewhat indistinct margins. Right hilar lymph node 0.9 cm in short axis on image 72/2, previously 1.0 cm. Lungs/Pleura: Small right pleural effusion, increased from prior, with some enhancement along the parietal pleural margin which could indicate an exudative effusion. I do not see an obvious mass along the pleura. Continued obstruction of the superior segmental bronchus of the right lower lobe with soft tissue density in this vicinity currently measuring 1.1 cm in thickness on image 83/7, formerly 1.1 cm. Bandlike densities in the right lower lobe are relatively similar to prior. Previous nodularity in the right lower lobe is likely obscured by atelectasis related to the pleural effusion. Upper Abdomen: The liver lesions have not progressed compared to yesterday's abdominal MRI and are better depicted on that exam. Musculoskeletal: Unchanged primarily lytic lesions in both scapula, in the T4 vertebral body eccentric to the left, and in the ribs (most notably in the right ninth and left tenth ribs). IMPRESSION: 1. Small right pleural effusion, increased from prior, with some enhancement along the parietal pleural margin which could indicate an exudative effusion. The area of the previous pulmonary nodules is obscured by the associated passive atelectasis. 2. Continued obstruction of the superior segmental bronchus of the right lower lobe with soft tissue density in this vicinity currently measuring 1.1 cm in thickness, formerly 1.1 cm. 3. Unchanged primarily lytic lesions in the scapula, T4 vertebral body eccentric to the left, and in the ribs. 4. Similar appearance of bandlike densities in the right lower lobe, probably from scarring. 5. The liver lesions have not progressed compared to yesterday's abdominal MRI and are better depicted on that exam. 6. Aortic  atherosclerosis. Aortic Atherosclerosis (ICD10-I70.0). Electronically Signed   By: WVan ClinesM.D.   On: 11/11/2019 13:11   NM Bone Scan Whole Body  Result Date: 11/11/2019 CLINICAL DATA:  Breast cancer restaging EXAM: NUCLEAR MEDICINE WHOLE BODY BONE SCAN TECHNIQUE: Whole body anterior and posterior images were obtained approximately 3 hours after intravenous injection of radiopharmaceutical. RADIOPHARMACEUTICALS:  21.0 mCi Technetium-940mDP IV COMPARISON:  Multiple exams, including chest CT 11/11/2019 and bone scan of 07/09/2019 FINDINGS: Similar appearance of accentuated uptake in both lower scapula compatible with metastatic lesions. Accentuated uptake in the right ninth and left tenth ribs medially compatible with metastatic disease. Stable focally accentuated uptake in the left sacrum suspicious for a metastatic lesion. Stable abnormal accentuated activity in the right femoral head suspicious for metastatic lesion. Stable faintly accentuated activity in the right anterior sixth rib compatible with a metastatic lesion. Left hip prosthesis. Degenerative activity in the shoulders. IMPRESSION: Stable scattered metastatic lesions in the thorax, left sacrum, and right femoral head. No new lesions identified. Electronically Signed   By: WaVan Clines.D.   On: 11/11/2019 13:15   MR  LIVER W WO CONTRAST  Result Date: 11/10/2019 CLINICAL DATA:  Breast cancer staging EXAM: MRI ABDOMEN WITHOUT AND WITH CONTRAST TECHNIQUE: Multiplanar multisequence MR imaging of the abdomen was performed both before and after the administration of intravenous contrast. CONTRAST:  33m GADAVIST GADOBUTROL 1 MMOL/ML IV SOLN COMPARISON:  Multiple prior studies most recent 08/03/2019 FINDINGS: Lower chest: T10 vertebral lesion showing increased T2 signal even on fat saturated images also likely with small adjacent T9 lesion. Small right effusion, increased when compared to the prior study. Right lower lobe pulmonary  lesion partially obscured by volume loss and adjacent effusion better demonstrated on previous chest CT. Lung bases not well assessed on MRI. Hepatobiliary: Stable mild dilation of the common bile duct in the setting of periampullary duodenal diverticulum. Fundal gallbladder lesion likely adenomyomatosis. Liver lesions compatible with metastasis. These are multifocal with similar distribution: Index lesions as follows. (Image 34, series 18): Left hepatic lobe lesion 1.1 cm. Previously 1.1 cm. This is in hepatic subsegment III Right hepatic lobe lesion (image 47, series 18): Posterior right hepatic lobe 13 mm, previously 16 mm. Pancreas: No focal, suspicious pancreatic lesion. Ductal dilation with similar appearance to the prior study. Spleen:  Spleen is normal size without focal lesion. Adrenals/Urinary Tract: Left adrenal adenoma unchanged. Right adrenal is normal. Renal contours are smooth. Stomach/Bowel: Limited assessment of the gastrointestinal tract. Moderate duodenal diverticulum adjacent to the ampulla. No acute gastrointestinal process to the extent visualized. Vascular/Lymphatic:  No abdominal lymphadenopathy. Vascular structures in the abdomen are patent. No aneurysm. Other:  No abdominal wall hernia. Musculoskeletal: Increased conspicuity of skeletal metastatic lesions when compared to the prior MRI particularly in the lower thoracic spine and in the lower lumbar spine image to a variable extent on today's study as these areas are at the edges of the imaged volume. IMPRESSION: 1. Slight decrease in size of hepatic metastatic lesions, dominant lesions compared to the prior study. No definite new lesions on today's exam. Consider Eovist for follow-up as parenchymal enhancement surrounding metastatic lesions allows for better exam to exam assessment of subtle changes. 2. Small right effusion, increased when compared to the prior study. 3. Increased visibility of bony lesions on today's study is compared to  priors. Consider correlation with bone scan or CT as warranted. This could be related to technical factors as well. 4. Stable pancreatic and biliary ductal dilation in the setting of periampullary duodenal diverticulum. Attention on follow-up 5. Right lower lobe pulmonary lesion partially obscured by volume loss and adjacent effusion better demonstrated on previous chest CT. Electronically Signed   By: GZetta BillsM.D.   On: 11/10/2019 12:05    ASSESSMENT: 69y.o. Barbara Parks, [place] woman with stage IV breast cancer, on BOLERO-4 trial  (1) status post right mastectomy and sentinel lymph node sampling 03/22/2004 for a right upper-outer quadrant pT2 pN1, stage IIB invasive ductal carcinoma with lobular features, grade 2, estrogen receptor and progesterone receptor positive, HER-2 negative, with an MIB-1 of 9% (([id number] [id number]  (2) addition all right axillary lymph node sampling 05/07/2004 showed 2 benign lymph nodes (4 lymph nodes previously removed, so total was one positive lymph node out of 6; [id number]  (3) the patient was evaluated by radiation oncology; no postmastectomy radiation recommended  (4) adjuvant chemotherapy with dose dense doxorubicin and cyclophosphamide x4 cycles (first cycle delayed one week) followed by dose dense paclitaxel x4 was completed 09/18/2004  (5) tamoxifen started March 2006, discontinued 2009  METASTATIC DISEASE: February 2013 (6) presenting with a large  pericardial effusion, large right pleural effusion and possible right middle lobe bronchial obstruction February 2013, status post pericardial window placement, fiberoptic bronchoscopy and right Pleurx placement 09/11/2011, with biopsy of the bronchus intermedius and pericardium positive for metastatic breast cancer, estrogen receptor 91% positive with moderate staining intensity, progesterone receptor 100% positive with strong staining intensity, with an MIB-1 of 35%, and no HER-2 amplification, the signals ratio  being 1.37 ([id number])  (7) enrolled in BOLERO-4 trial 10/10/2011, receiving letrozole and everolimus  (a) two small areas of enhancement in the cerebellum noted by brain MRI 10/03/2011 were no longer apparent on repeat MRI 08/21/2012-- most recent brain MRI 04/01/2014 showed no evidence of intracranial metastatic disease  (b) sclerotic lesions in left iliac bone and sacrum have not been biopsied; stable; to start zolendronate after patient updates her dental care (extraction planned)--never started  (c) RLL lung nodule, stable (rescanned 07/12/2014 and 08/11/2014) R hilar and subcarinal lymph nodes: stable  (d) CT of the chest:03/25/2018, stable  (e) CT Angio 12/12/2018 stable  (f) off BOLERO trial (trial ended) as of July 2020, but continuing on study drugs  (g) letrozole and everolimus discontinued January 2021 with disease progression  (8) additional problems:  (a) hepatic steatosis  (b) COPD/ emphysema/ asthma  (c) advanced L hip osteoarthritis, status post left total hip replacement 06/07/2015  (d) aortoiliac atherosclerosis  (e) dental evaluation pending w possible dental extractions  (f) likely thalassemia trait  (g) hyperlipidemia  (9) Bone density concerns: DEXA scan 08/11/2013 was normal  (a) repeat DEXA scan 04/09/2018 shows a T score of -1.8, osteopenia  (10) disease monitoring:  (a) PET scan 12/22/2018 shows no hypermetabolic soft tissue metastases but multifocal bony metastasis  (b) denosumab/Xgeva started on 02/25/2019, repeated every 28 days (delayed due to dental extraction healing issues)  (c) bone scan 05/13/2019 serves as baseline study with increased activity in the right scapular, lower ribs and right femoral head (stable compared to PET scan of May 2020  (d) bone scan 07/09/2019 stable  (e) CT chest 07/09/2019 shows stable lung lesions but emerging left liver lobe 0.7 cm lesion  (f) liver MRI 08/03/2019 shows approximately 8 liver lesions, the largest measuring 1.8  cm.  (g) liver biopsy 08/06/2019 confirms metastatic breast cancer, estrogen and progesterone receptor positive, with an MIB-1 of 5% and HER-2 not amplified  (11) denosumab/Xgeva begun 02/25/2019 repeated every 4 weeks  (12) fulvestrant started 08/24/2019  (a) palbociclib 125 mg daily 21 days on 7 days off started 08/25/2019     PLAN: Uyen is now a little over 8 years out from definitive diagnosis of metastatic breast cancer, with very well-controlled disease.  We reviewed her scans in detail.  She has a slight increase in the right pleural effusion, without obvious mass associated with that, and she has some bronchitic changes.  The bone scan is stable.  She does have that discomfort or pain in the right hip area which is stable as well.  The liver lesions which are real measurable disease are slightly improved and that is I think what clenches.  We are going to continue the fulvestrant and Xgeva monthly.  She will continue the palbociclib 21 days on 7 days off at the current dose.  We will follow the CA 27-29 on a monthly basis looking for trends.  Assuming no change then she will see me again in 3 months and before that visit she will have an MRI of the liver and a chest x-ray  She knows  to call us for any other problem that may develop before then  Total encounter time 30 minutes.Sarajane Jews C. Denetta Fei, MD 11/16/19 1:54 PM Medical Oncology and Hematology [facility] [place], [place] [address] Tel. [phone]    Fax. [phone]   I, Wilburn Mylar, am acting as scribe for Dr. Virgie Dad. Raesha Coonrod.  I, Lurline Del MD, have reviewed the above documentation for accuracy and completeness, and I agree with the above.    *Total Encounter Time as defined by the Centers for Medicare and Medicaid Services includes, in addition to the face-to-face time of a patient visit (documented in the note above) non-face-to-face time: obtaining and reviewing  outside history, ordering and reviewing medications, tests or procedures, care coordination (communications with other health care professionals or caregivers) and documentation in the medical record.

## 2019-11-16 ENCOUNTER — Inpatient Hospital Stay: Payer: Medicare Other

## 2019-11-16 ENCOUNTER — Other Ambulatory Visit: Payer: Self-pay

## 2019-11-16 ENCOUNTER — Inpatient Hospital Stay: Payer: Medicare Other | Attending: Oncology

## 2019-11-16 ENCOUNTER — Ambulatory Visit: Payer: Medicare Other

## 2019-11-16 ENCOUNTER — Inpatient Hospital Stay (HOSPITAL_BASED_OUTPATIENT_CLINIC_OR_DEPARTMENT_OTHER): Payer: Medicare Other | Admitting: Oncology

## 2019-11-16 VITALS — BP 106/81 | HR 97 | Temp 98.5°F | Resp 18 | Ht 64.5 in | Wt 141.9 lb

## 2019-11-16 DIAGNOSIS — Z170 Estrogen receptor positive status [ER+]: Secondary | ICD-10-CM | POA: Diagnosis not present

## 2019-11-16 DIAGNOSIS — C50411 Malignant neoplasm of upper-outer quadrant of right female breast: Secondary | ICD-10-CM

## 2019-11-16 DIAGNOSIS — C787 Secondary malignant neoplasm of liver and intrahepatic bile duct: Secondary | ICD-10-CM

## 2019-11-16 DIAGNOSIS — C7800 Secondary malignant neoplasm of unspecified lung: Secondary | ICD-10-CM

## 2019-11-16 DIAGNOSIS — C801 Malignant (primary) neoplasm, unspecified: Secondary | ICD-10-CM

## 2019-11-16 DIAGNOSIS — C7951 Secondary malignant neoplasm of bone: Secondary | ICD-10-CM

## 2019-11-16 DIAGNOSIS — I3131 Malignant pericardial effusion in diseases classified elsewhere: Secondary | ICD-10-CM

## 2019-11-16 DIAGNOSIS — Z5111 Encounter for antineoplastic chemotherapy: Secondary | ICD-10-CM | POA: Insufficient documentation

## 2019-11-16 DIAGNOSIS — I313 Pericardial effusion (noninflammatory): Secondary | ICD-10-CM

## 2019-11-16 DIAGNOSIS — C50911 Malignant neoplasm of unspecified site of right female breast: Secondary | ICD-10-CM

## 2019-11-16 LAB — CBC WITH DIFFERENTIAL/PLATELET
Abs Immature Granulocytes: 0.03 10*3/uL (ref 0.00–0.07)
Basophils Absolute: 0.1 10*3/uL (ref 0.0–0.1)
Basophils Relative: 2 %
Eosinophils Absolute: 0 10*3/uL (ref 0.0–0.5)
Eosinophils Relative: 1 %
HCT: 42.8 % (ref 36.0–46.0)
Hemoglobin: 14.2 g/dL (ref 12.0–15.0)
Immature Granulocytes: 1 %
Lymphocytes Relative: 17 %
Lymphs Abs: 0.8 10*3/uL (ref 0.7–4.0)
MCH: 32.6 pg (ref 26.0–34.0)
MCHC: 33.2 g/dL (ref 30.0–36.0)
MCV: 98.4 fL (ref 80.0–100.0)
Monocytes Absolute: 0.7 10*3/uL (ref 0.1–1.0)
Monocytes Relative: 14 %
Neutro Abs: 3.1 10*3/uL (ref 1.7–7.7)
Neutrophils Relative %: 65 %
Platelets: 165 10*3/uL (ref 150–400)
RBC: 4.35 MIL/uL (ref 3.87–5.11)
RDW: 21.6 % — ABNORMAL HIGH (ref 11.5–15.5)
WBC: 4.8 10*3/uL (ref 4.0–10.5)
nRBC: 0 % (ref 0.0–0.2)

## 2019-11-16 LAB — COMPREHENSIVE METABOLIC PANEL
ALT: 13 U/L (ref 0–44)
AST: 15 U/L (ref 15–41)
Albumin: 3.6 g/dL (ref 3.5–5.0)
Alkaline Phosphatase: 71 U/L (ref 38–126)
Anion gap: 9 (ref 5–15)
BUN: 24 mg/dL — ABNORMAL HIGH (ref 8–23)
CO2: 23 mmol/L (ref 22–32)
Calcium: 9.4 mg/dL (ref 8.9–10.3)
Chloride: 107 mmol/L (ref 98–111)
Creatinine, Ser: 1.17 mg/dL — ABNORMAL HIGH (ref 0.44–1.00)
GFR calc Af Amer: 55 mL/min — ABNORMAL LOW (ref >60)
GFR calc non Af Amer: 48 mL/min — ABNORMAL LOW (ref >60)
Glucose, Bld: 98 mg/dL (ref 70–99)
Potassium: 4.5 mmol/L (ref 3.5–5.1)
Sodium: 139 mmol/L (ref 135–145)
Total Bilirubin: 0.3 mg/dL (ref 0.3–1.2)
Total Protein: 7.2 g/dL (ref 6.5–8.1)

## 2019-11-16 MED ORDER — DENOSUMAB 120 MG/1.7ML ~~LOC~~ SOLN
120.0000 mg | Freq: Once | SUBCUTANEOUS | Status: AC
  Administered 2019-11-16: 120 mg via SUBCUTANEOUS

## 2019-11-16 MED ORDER — FULVESTRANT 250 MG/5ML IM SOLN
500.0000 mg | Freq: Once | INTRAMUSCULAR | Status: AC
  Administered 2019-11-16: 500 mg via INTRAMUSCULAR

## 2019-11-16 MED ORDER — DENOSUMAB 120 MG/1.7ML ~~LOC~~ SOLN
SUBCUTANEOUS | Status: AC
  Filled 2019-11-16: qty 1.7

## 2019-11-16 NOTE — Patient Instructions (Signed)
Fulvestrant injection What is this medicine? FULVESTRANT (ful VES trant) blocks the effects of estrogen. It is used to treat breast cancer. This medicine may be used for other purposes; ask your health care provider or pharmacist if you have questions. COMMON BRAND NAME(S): FASLODEX What should I tell my health care provider before I take this medicine? They need to know if you have any of these conditions:  bleeding disorders  liver disease  low blood counts, like low white cell, platelet, or red cell counts  an unusual or allergic reaction to fulvestrant, other medicines, foods, dyes, or preservatives  pregnant or trying to get pregnant  breast-feeding How should I use this medicine? This medicine is for injection into a muscle. It is usually given by a health care professional in a hospital or clinic setting. Talk to your pediatrician regarding the use of this medicine in children. Special care may be needed. Overdosage: If you think you have taken too much of this medicine contact a poison control center or emergency room at once. NOTE: This medicine is only for you. Do not share this medicine with others. What if I miss a dose? It is important not to miss your dose. Call your doctor or health care professional if you are unable to keep an appointment. What may interact with this medicine?  medicines that treat or prevent blood clots like warfarin, enoxaparin, dalteparin, apixaban, dabigatran, and rivaroxaban This list may not describe all possible interactions. Give your health care provider a list of all the medicines, herbs, non-prescription drugs, or dietary supplements you use. Also tell them if you smoke, drink alcohol, or use illegal drugs. Some items may interact with your medicine. What should I watch for while using this medicine? Your condition will be monitored carefully while you are receiving this medicine. You will need important blood work done while you are taking  this medicine. Do not become pregnant while taking this medicine or for at least 1 year after stopping it. Women of child-bearing potential will need to have a negative pregnancy test before starting this medicine. Women should inform their doctor if they wish to become pregnant or think they might be pregnant. There is a potential for serious side effects to an unborn child. Men should inform their doctors if they wish to father a child. This medicine may lower sperm counts. Talk to your health care professional or pharmacist for more information. Do not breast-feed an infant while taking this medicine or for 1 year after the last dose. What side effects may I notice from receiving this medicine? Side effects that you should report to your doctor or health care professional as soon as possible:  allergic reactions like skin rash, itching or hives, swelling of the face, lips, or tongue  feeling faint or lightheaded, falls  pain, tingling, numbness, or weakness in the legs  signs and symptoms of infection like fever or chills; cough; flu-like symptoms; sore throat  vaginal bleeding Side effects that usually do not require medical attention (report to your doctor or health care professional if they continue or are bothersome):  aches, pains  constipation  diarrhea  headache  hot flashes  nausea, vomiting  pain at site where injected  stomach pain This list may not describe all possible side effects. Call your doctor for medical advice about side effects. You may report side effects to FDA at 1-800-FDA-1088. Where should I keep my medicine? This drug is given in a hospital or clinic and will  not be stored at home. NOTE: This sheet is a summary. It may not cover all possible information. If you have questions about this medicine, talk to your doctor, pharmacist, or health care provider.  2020 [employer] Standard (2017-10-23 11:34:41) Denosumab injection What is this  medicine? DENOSUMAB (den oh sue mab) slows bone breakdown. Prolia is used to treat osteoporosis in women after menopause and in men, and in people who are taking corticosteroids for 6 months or more. Delton See is used to treat a high calcium level due to cancer and to prevent bone fractures and other bone problems caused by multiple myeloma or cancer bone metastases. Delton See is also used to treat giant cell tumor of the bone. This medicine may be used for other purposes; ask your health care provider or pharmacist if you have questions. COMMON BRAND NAME(S): Prolia, XGEVA What should I tell my health care provider before I take this medicine? They need to know if you have any of these conditions:  dental disease  having surgery or tooth extraction  infection  kidney disease  low levels of calcium or Vitamin D in the blood  malnutrition  on hemodialysis  skin conditions or sensitivity  thyroid or parathyroid disease  an unusual reaction to denosumab, other medicines, foods, dyes, or preservatives  pregnant or trying to get pregnant  breast-feeding How should I use this medicine? This medicine is for injection under the skin. It is given by a health care professional in a hospital or clinic setting. A special MedGuide will be given to you before each treatment. Be sure to read this information carefully each time. For Prolia, talk to your pediatrician regarding the use of this medicine in children. Special care may be needed. For Delton See, talk to your pediatrician regarding the use of this medicine in children. While this drug may be prescribed for children as young as 13 years for selected conditions, precautions do apply. Overdosage: If you think you have taken too much of this medicine contact a poison control center or emergency room at once. NOTE: This medicine is only for you. Do not share this medicine with others. What if I miss a dose? It is important not to miss your dose. Call  your doctor or health care professional if you are unable to keep an appointment. What may interact with this medicine? Do not take this medicine with any of the following medications:  other medicines containing denosumab This medicine may also interact with the following medications:  medicines that lower your chance of fighting infection  steroid medicines like prednisone or cortisone This list may not describe all possible interactions. Give your health care provider a list of all the medicines, herbs, non-prescription drugs, or dietary supplements you use. Also tell them if you smoke, drink alcohol, or use illegal drugs. Some items may interact with your medicine. What should I watch for while using this medicine? Visit your doctor or health care professional for regular checks on your progress. Your doctor or health care professional may order blood tests and other tests to see how you are doing. Call your doctor or health care professional for advice if you get a fever, chills or sore throat, or other symptoms of a cold or flu. Do not treat yourself. This drug may decrease your body's ability to fight infection. Try to avoid being around people who are sick. You should make sure you get enough calcium and vitamin D while you are taking this medicine, unless your doctor tells  you not to. Discuss the foods you eat and the vitamins you take with your health care professional. See your dentist regularly. Brush and floss your teeth as directed. Before you have any dental work done, tell your dentist you are receiving this medicine. Do not become pregnant while taking this medicine or for 5 months after stopping it. Talk with your doctor or health care professional about your birth control options while taking this medicine. Women should inform their doctor if they wish to become pregnant or think they might be pregnant. There is a potential for serious side effects to an unborn child. Talk to your  health care professional or pharmacist for more information. What side effects may I notice from receiving this medicine? Side effects that you should report to your doctor or health care professional as soon as possible:  allergic reactions like skin rash, itching or hives, swelling of the face, lips, or tongue  bone pain  breathing problems  dizziness  jaw pain, especially after dental work  redness, blistering, peeling of the skin  signs and symptoms of infection like fever or chills; cough; sore throat; pain or trouble passing urine  signs of low calcium like fast heartbeat, muscle cramps or muscle pain; pain, tingling, numbness in the hands or feet; seizures  unusual bleeding or bruising  unusually weak or tired Side effects that usually do not require medical attention (report to your doctor or health care professional if they continue or are bothersome):  constipation  diarrhea  headache  joint pain  loss of appetite  muscle pain  runny nose  tiredness  upset stomach This list may not describe all possible side effects. Call your doctor for medical advice about side effects. You may report side effects to FDA at 1-800-FDA-1088. Where should I keep my medicine? This medicine is only given in a clinic, doctor's office, or other health care setting and will not be stored at home. NOTE: This sheet is a summary. It may not cover all possible information. If you have questions about this medicine, talk to your doctor, pharmacist, or health care provider.  2020 [employer] Standard (2017-11-21 16:10:44)  

## 2019-11-17 ENCOUNTER — Telehealth: Payer: Self-pay | Admitting: Oncology

## 2019-11-17 LAB — CANCER ANTIGEN 27.29: CA 27.29: 333.8 U/mL — ABNORMAL HIGH (ref 0.0–38.6)

## 2019-11-17 NOTE — Telephone Encounter (Signed)
Scheduled appts per 4/20 los. Left voicemail with next appt details.

## 2019-12-14 ENCOUNTER — Inpatient Hospital Stay (HOSPITAL_BASED_OUTPATIENT_CLINIC_OR_DEPARTMENT_OTHER): Payer: Medicare Other | Admitting: Adult Health

## 2019-12-14 ENCOUNTER — Encounter: Payer: Self-pay | Admitting: *Deleted

## 2019-12-14 ENCOUNTER — Other Ambulatory Visit: Payer: Self-pay

## 2019-12-14 ENCOUNTER — Inpatient Hospital Stay: Payer: Medicare Other | Attending: Oncology

## 2019-12-14 ENCOUNTER — Inpatient Hospital Stay: Payer: Medicare Other

## 2019-12-14 VITALS — BP 115/63 | HR 95 | Resp 20

## 2019-12-14 VITALS — BP 105/65 | HR 92 | Temp 97.8°F | Resp 18 | Ht 64.5 in | Wt 145.4 lb

## 2019-12-14 DIAGNOSIS — Z170 Estrogen receptor positive status [ER+]: Secondary | ICD-10-CM

## 2019-12-14 DIAGNOSIS — I3131 Malignant pericardial effusion in diseases classified elsewhere: Secondary | ICD-10-CM

## 2019-12-14 DIAGNOSIS — C50411 Malignant neoplasm of upper-outer quadrant of right female breast: Secondary | ICD-10-CM

## 2019-12-14 DIAGNOSIS — C787 Secondary malignant neoplasm of liver and intrahepatic bile duct: Secondary | ICD-10-CM | POA: Diagnosis not present

## 2019-12-14 DIAGNOSIS — C7951 Secondary malignant neoplasm of bone: Secondary | ICD-10-CM

## 2019-12-14 DIAGNOSIS — C7800 Secondary malignant neoplasm of unspecified lung: Secondary | ICD-10-CM | POA: Diagnosis not present

## 2019-12-14 DIAGNOSIS — C50911 Malignant neoplasm of unspecified site of right female breast: Secondary | ICD-10-CM | POA: Diagnosis not present

## 2019-12-14 DIAGNOSIS — Z5111 Encounter for antineoplastic chemotherapy: Secondary | ICD-10-CM | POA: Insufficient documentation

## 2019-12-14 LAB — CBC WITH DIFFERENTIAL/PLATELET
Abs Immature Granulocytes: 0.07 10*3/uL (ref 0.00–0.07)
Basophils Absolute: 0.1 10*3/uL (ref 0.0–0.1)
Basophils Relative: 2 %
Eosinophils Absolute: 0 10*3/uL (ref 0.0–0.5)
Eosinophils Relative: 1 %
HCT: 41.9 % (ref 36.0–46.0)
Hemoglobin: 14.1 g/dL (ref 12.0–15.0)
Immature Granulocytes: 1 %
Lymphocytes Relative: 17 %
Lymphs Abs: 0.9 10*3/uL (ref 0.7–4.0)
MCH: 34.3 pg — ABNORMAL HIGH (ref 26.0–34.0)
MCHC: 33.7 g/dL (ref 30.0–36.0)
MCV: 101.9 fL — ABNORMAL HIGH (ref 80.0–100.0)
Monocytes Absolute: 0.8 10*3/uL (ref 0.1–1.0)
Monocytes Relative: 15 %
Neutro Abs: 3.1 10*3/uL (ref 1.7–7.7)
Neutrophils Relative %: 64 %
Platelets: 147 10*3/uL — ABNORMAL LOW (ref 150–400)
RBC: 4.11 MIL/uL (ref 3.87–5.11)
RDW: 16.3 % — ABNORMAL HIGH (ref 11.5–15.5)
WBC: 5 10*3/uL (ref 4.0–10.5)
nRBC: 0 % (ref 0.0–0.2)

## 2019-12-14 LAB — COMPREHENSIVE METABOLIC PANEL
ALT: 12 U/L (ref 0–44)
AST: 14 U/L — ABNORMAL LOW (ref 15–41)
Albumin: 3.6 g/dL (ref 3.5–5.0)
Alkaline Phosphatase: 73 U/L (ref 38–126)
Anion gap: 11 (ref 5–15)
BUN: 23 mg/dL (ref 8–23)
CO2: 23 mmol/L (ref 22–32)
Calcium: 9.3 mg/dL (ref 8.9–10.3)
Chloride: 107 mmol/L (ref 98–111)
Creatinine, Ser: 1.19 mg/dL — ABNORMAL HIGH (ref 0.44–1.00)
GFR calc Af Amer: 54 mL/min — ABNORMAL LOW (ref >60)
GFR calc non Af Amer: 47 mL/min — ABNORMAL LOW (ref >60)
Glucose, Bld: 99 mg/dL (ref 70–99)
Potassium: 4.6 mmol/L (ref 3.5–5.1)
Sodium: 141 mmol/L (ref 135–145)
Total Bilirubin: 0.3 mg/dL (ref 0.3–1.2)
Total Protein: 7.1 g/dL (ref 6.5–8.1)

## 2019-12-14 MED ORDER — FULVESTRANT 250 MG/5ML IM SOLN
500.0000 mg | Freq: Once | INTRAMUSCULAR | Status: AC
  Administered 2019-12-14: 500 mg via INTRAMUSCULAR

## 2019-12-14 MED ORDER — DENOSUMAB 120 MG/1.7ML ~~LOC~~ SOLN
120.0000 mg | Freq: Once | SUBCUTANEOUS | Status: AC
  Administered 2019-12-14: 120 mg via SUBCUTANEOUS

## 2019-12-14 MED ORDER — DENOSUMAB 120 MG/1.7ML ~~LOC~~ SOLN
SUBCUTANEOUS | Status: AC
  Filled 2019-12-14: qty 1.7

## 2019-12-14 MED ORDER — FULVESTRANT 250 MG/5ML IM SOLN
INTRAMUSCULAR | Status: AC
  Filled 2019-12-14: qty 5

## 2019-12-14 NOTE — Progress Notes (Signed)
[facility]  [phone] [phone]    ID: Barbara Parks DOB: 1950/11/20  MR#: [id number]  ZES#:[id number]  Patient Care Team: Barbara Small, MD as PCP - General (Family Medicine) Barbara Pain, MD (Cardiology) Barbara Pollack, MD (Cardiothoracic Surgery) Parks, Barbara Dad, MD as Consulting Physician (Oncology) OTHER MD: Dr Barbara Parks- Barbara Parks, DDS; Barbara Arabian MD   CHIEF COMPLAINT: metastatic breast cancer, formerly on BOLERO study  CURRENT TREATMENT: Fulvestrant, palbociclib, denosumab/Xgeva   INTERVAL HISTORY: Barbara Parks returns today for follow-up and treatment of her metastatic breast cancer.  She was changed to Fulvestrant.and Palbociclib, starting 08/24/2019 and continues on Xgeva.  She receives fulvestrant every 4 weeks.  She tolerates this well and notes hot flashes and some hair thinning.    She also continues on Palbociclib.  She is due to start this back this weekend.    She is due for [facility] today and continues to tolerate this well.    Since her last visit, she underwent restaging scans. Liver MRI performed on 11/10/2019 showed: slight decrease in size of hepatic metastatic lesions; Parks right effusion, increased from prior; increased visibility of bony lesions; stable pancreatic and biliary ductal dilation.  Chest CT performed on 11/11/2019 showed: Parks right pleural effusion, increased from prior, with some enhancement along the parietal pleural margin; stable, continued obstruction of superior bronchus of right lower lobe; unchanged lytic lesions.  Bone scan performed the same day showed stable previously demonstrated metastatic lesions with no new lesions.   REVIEW OF SYSTEMS: Barbara Parks is doing well in regards to activity.  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  She notes that she has gained weight, and is up about 8 pounds in the past two months.  She says she hasn't changed anything as far as intake or  activity level.  She attributes it to the Fulvestrant.    Barbara Parks denies any new issues.  She is without fever, chills, chest Parks, palpitations, bowel/bladder changes, headaches, vision issues, cough, shortness of breath, nausea, or vomiting.  A detailed ROS was otherwise non contributory.    BREAST CANCER HISTORY: From Dr. Collier Salina Parks's original intake note 04/13/2004:  "This woman has been in good health all of her life. She recently moved from [place] to work here.  She palpated a mass at about the 12 o'clock position in mid-July. She has not noticed any nipple retraction or skin changes.  She was seen by her primary care doctor who subsequently referred her for a mammogram.  Mammogram was performed on 03/01/04. This demonstrated a spiculated 2 cm mass at the 12 o'clock position in the right breast.  Upper outer quadrant of the left breast shows some distortion.   Physical exam at that time showed a firm, nontender nodule at the 12 o'clock position in the right breast, 5 cm from the nipple.  Ultrasound of this area showed a hypoechoic ill-defined mass, measuring 2.2 x 1.3 x 1.4 cm.  Physical exam of the left breast showed general vague thickening, upper outer quadrant of the left breast with a discrete palpable mass. The ultrasound performed showed a single hypoechoic ill-defined nodule at the 1 o'clock position, measuring 7 x 5 x 6 mm.    She had biopsies of both lesions on 03/02/03.  Needle core biopsy of the lesion on the right breast revealed invasive mammary carcinoma.  Needle core biopsy of the left breast showed a complex fibroadenoma.  Prognostic panel of the lesion on the  right breast showed it to be ER positive at 73%, PR positive at 90% and proliferative index 9%, HER-2 was 1+.  Patient was referred to Dr. Annamaria Parks, who performed a simple mastectomy with sentinel lymph node evaluation on 03/22/04.  Final pathology showed this to be an invasive ductal carcinoma  with lobular features, measuring 2.2  cm, grade 2 of 3.  Margins negative for carcinoma.  Invasive ductal carcinoma was extended to involve deep dermis of the nipple.  Lymphovascular invasion was identified.  Total of 4 sentinel lymph nodes were evaluated.  Touch imprints at the time of the OR was felt to be negative.  Subsequent evaluation showed a 5 mm focus of metastatic carcinoma in one of the four lymph nodes on microscopic after sectioning.  There was extracapsular extension  of one lymph node as well. The remaining three lymph nodes were all negative."  The patient's subsequent history is detailed above.   PAST MEDICAL HISTORY: Past Medical History:  Diagnosis Date  . Arthritis    left hip  . Asthma   . breast ca 2005   breast/chemo R mastectomy  . Dermatitis   . Diabetes mellitus without complication ([place])   . GERD (gastroesophageal reflux disease)   . Hypercholesteremia   . Metastasis to lung ([place]) dx'd 08/2011  . Osteopenia due to cancer therapy 09/09/2013  . Palpitations   . Personal history of chemotherapy   . Shortness of breath     PAST SURGICAL HISTORY: Past Surgical History:  Procedure Laterality Date  . BREAST SURGERY  2005   right  . CATARACT EXTRACTION W/PHACO Left 01/18/2014   Procedure: CATARACT EXTRACTION PHACO AND INTRAOCULAR LENS PLACEMENT (IOC);  Surgeon: Barbara Guadeloupe T. Gershon Crane, MD;  Location: [facility];  Service: Ophthalmology;  Laterality: Left;  CDE 15.79  . CATARACT EXTRACTION W/PHACO Right 02/08/2014   Procedure: CATARACT EXTRACTION PHACO AND INTRAOCULAR LENS PLACEMENT (IOC);  Surgeon: Barbara Guadeloupe T. Gershon Crane, MD;  Location: [facility];  Service: Ophthalmology;  Laterality: Right;  CDE 4.41  . CHEST TUBE INSERTION  09/11/2011   Procedure: INSERTION PLEURAL DRAINAGE CATHETER;  Surgeon: Barbara Pollack, MD;  Location: [facility];  Service: Thoracic;  Laterality: Right;  . MASTECTOMY Right   . PERICARDIAL WINDOW  09/11/2011   Procedure: PERICARDIAL WINDOW;  Surgeon: Barbara Pollack, MD;  Location: [facility] OR;  Service: Thoracic;   Laterality: N/A;  . REMOVAL OF PLEURAL DRAINAGE CATHETER  12/19/2011   Procedure: REMOVAL OF PLEURAL DRAINAGE CATHETER;  Surgeon: Barbara Pollack, MD;  Location: [facility];  Service: Thoracic;  Laterality: Right;  TO BE DONE IN MINOR ROOM, SHORT STAY  . [place]  . TOTAL HIP ARTHROPLASTY Left 06/07/2015   Procedure: LEFT TOTAL HIP ARTHROPLASTY ANTERIOR APPROACH;  Surgeon: Barbara Arabian, MD;  Location: [facility];  Service: Orthopedics;  Laterality: Left;  Marland Kitchen VIDEO BRONCHOSCOPY  09/11/2011   Procedure: VIDEO BRONCHOSCOPY;  Surgeon: Barbara Pollack, MD;  Location: [facility] OR;  Service: Thoracic;  Laterality: N/A;    FAMILY HISTORY Family History  Problem Relation Age of Onset  . Cancer Mother        breast  . Heart disease Father   . Diabetes Other   . Anesthesia problems Neg Hx    The patient's mother was diagnosed with breast cancer at age 31, she died age 64 from congestive heart failure.The patient's father died from heart disease at age 85.  She has one sister alive & well.  Two brothers alive & well,  one with diabetes. The patient's sister was also diagnosed with breast cancer, and was tested for the BRCA gene, and was negative. The patient herself has not been tested. There is no history of ovarian cancer in the family   GYNECOLOGIC HISTORY:  No LMP recorded. Patient is postmenopausal. Menarche age 69, the patient is GX P0. She stopped having periods with chemotherapy in 2005. 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 She was a Archivist. She is single, lives alone with her Shitzu-poodle Sammie.. Family is all in the [place] area.     ADVANCED DIRECTIVES: Not in place. At the 02/23/2014 visit the patient was given the appropriate documents to complete and notarize at her discretion. She tells me she is planning to name her sister, Billie Ruddy, as healthcare power of  attorney. [name] can be reached at [phone]   HEALTH MAINTENANCE: Social History   Tobacco Use  . Smoking status: Former Smoker    Packs/day: 1.50    Years: 30.00    Pack years: 45.00    Types: Cigarettes    Quit date: 09/10/2007    Years since quitting: 12.2  . Smokeless tobacco: Never Used  Substance Use Topics  . Alcohol use: No  . Drug use: No    Colonoscopy:  PAP:  Bone density: 04/09/2018, T score -1.8  Lipid panel:  Allergies  Allergen Reactions  . Aspirin     REACTION: upset stomach  . Latex Other (See Comments)    Blistering and skin peels off  . Nsaids Nausea And Vomiting    Extreme nausea and vomiting    Current Outpatient Medications  Medication Sig Dispense Refill  . cholecalciferol (VITAMIN D3) 25 MCG (1000 UT) tablet Take 2 tablets (2,000 Units total) by mouth daily. 180 tablet 4  . cyclobenzaprine (FLEXERIL) 10 MG tablet Take 1 tablet (10 mg total) by mouth 2 (two) times daily as needed for muscle spasms. 20 tablet 0  . gemfibrozil (LOPID) 600 MG tablet TAKE 1 TABLET (600 MG TOTAL) BY MOUTH 2 (TWO) TIMES DAILY BEFORE A MEAL. 180 tablet 1  . loperamide (IMODIUM) 2 MG capsule Take 2 mg by mouth 4 (four) times daily as needed for diarrhea or loose stools. Reported on 11/30/2015    . metFORMIN (GLUCOPHAGE-XR) 500 MG 24 hr tablet Take 1 tablet (500 mg total) by mouth 3 (three) times daily before meals.  3  . palbociclib (IBRANCE) 125 MG tablet Take 1 tablet (125 mg total) by mouth daily. Take for 21 days on, 7 days off, repeat every 28 days. 21 tablet 6  . traMADol (ULTRAM) 50 MG tablet Take 0.5-1 tablets (25-50 mg total) by mouth every 6 (six) hours as needed. 30 tablet 0   No current facility-administered medications for this visit.    OBJECTIVE: White woman who appears stated age 69:   12/14/19 1451  BP: 105/65  Pulse: 92  Resp: 18  Temp: 97.8 F (36.6 C)  SpO2: 97%     Body mass index is 24.57 kg/m.   Filed Weights   12/14/19 1451    Weight: 145 lb 6.4 oz (66 kg)   ECOG FS:1 - Symptomatic but completely ambulatory GENERAL: Patient is a well appearing female in no acute distress HEENT:  Sclerae anicteric.  Mask in place.  Neck is supple.  NODES:  No cervical, supraclavicular, or axillary lymphadenopathy palpated.  BREAST EXAM:  Deferred. LUNGS:  Clear to auscultation bilaterally.  No wheezes or rhonchi. HEART:  Regular rate and rhythm. No murmur appreciated. ABDOMEN:  Soft, nontender.  Positive, normoactive bowel sounds. No organomegaly palpated. MSK:  No focal spinal tenderness to palpation. Full range of motion bilaterally in the upper extremities. EXTREMITIES:  No peripheral edema.   SKIN:  Clear with no obvious rashes or skin changes. No nail dyscrasia. NEURO:  Nonfocal. Well oriented.  Appropriate affect.    LAB  RESULTS:  CMP     Component Value Date/Time   NA 141 12/14/2019 1327   NA 139 07/08/2017 0942   K 4.6 12/14/2019 1327   K 3.8 07/08/2017 0942   CL 107 12/14/2019 1327   CL 109 (H) 12/31/2012 0940   CO2 23 12/14/2019 1327   CO2 18 (L) 07/08/2017 0942   GLUCOSE 99 12/14/2019 1327   GLUCOSE 156 (H) 07/08/2017 0942   GLUCOSE 127 (H) 12/31/2012 0940   BUN 23 12/14/2019 1327   BUN 17.7 07/08/2017 0942   CREATININE 1.19 (H) 12/14/2019 1327   CREATININE 1.18 (H) 12/25/2017 0959   CREATININE 1.0 07/08/2017 0942   CALCIUM 9.3 12/14/2019 1327   CALCIUM 10.4 07/08/2017 0942   PROT 7.1 12/14/2019 1327   PROT 7.7 07/08/2017 0942   ALBUMIN 3.6 12/14/2019 1327   ALBUMIN 3.4 (L) 07/08/2017 0942   AST 14 (L) 12/14/2019 1327   AST 50 (H) 12/25/2017 0959   AST 54 (H) 07/08/2017 0942   ALT 12 12/14/2019 1327   ALT 53 12/25/2017 0959   ALT 39 07/08/2017 0942   ALKPHOS 73 12/14/2019 1327   ALKPHOS 232 (H) 07/08/2017 0942   BILITOT 0.3 12/14/2019 1327   BILITOT 0.3 12/25/2017 0959   BILITOT 0.42 07/08/2017 0942   GFRNONAA 47 (L) 12/14/2019 1327   GFRNONAA 47 (L) 12/25/2017 0959   GFRAA 54 (L)  12/14/2019 1327   GFRAA 54 (L) 12/25/2017 0959    I No results found for: SPEP  Lab Results  Component Value Date   WBC 5.0 12/14/2019   NEUTROABS 3.1 12/14/2019   HGB 14.1 12/14/2019   HCT 41.9 12/14/2019   MCV 101.9 (H) 12/14/2019   PLT 147 (L) 12/14/2019      Chemistry      Component Value Date/Time   NA 141 12/14/2019 1327   NA 139 07/08/2017 0942   K 4.6 12/14/2019 1327   K 3.8 07/08/2017 0942   CL 107 12/14/2019 1327   CL 109 (H) 12/31/2012 0940   CO2 23 12/14/2019 1327   CO2 18 (L) 07/08/2017 0942   BUN 23 12/14/2019 1327   BUN 17.7 07/08/2017 0942   CREATININE 1.19 (H) 12/14/2019 1327   CREATININE 1.18 (H) 12/25/2017 0959   CREATININE 1.0 07/08/2017 0942      Component Value Date/Time   CALCIUM 9.3 12/14/2019 1327   CALCIUM 10.4 07/08/2017 0942   ALKPHOS 73 12/14/2019 1327   ALKPHOS 232 (H) 07/08/2017 0942   AST 14 (L) 12/14/2019 1327   AST 50 (H) 12/25/2017 0959   AST 54 (H) 07/08/2017 0942   ALT 12 12/14/2019 1327   ALT 53 12/25/2017 0959   ALT 39 07/08/2017 0942   BILITOT 0.3 12/14/2019 1327   BILITOT 0.3 12/25/2017 0959   BILITOT 0.42 07/08/2017 0942       Lab Results  Component Value Date   LABCA2 58 (H) 09/14/2012    No components found for: [id number]  No results for input(s): INR  in the last 168 hours.  Urinalysis    Component Value Date/Time   COLORURINE YELLOW 11/25/2017 0901   APPEARANCEUR HAZY (A) 11/25/2017 0901   LABSPEC 1.017 11/25/2017 0901   LABSPEC 1.030 07/13/2015 0841   PHURINE 5.0 11/25/2017 0901   GLUCOSEU NEGATIVE 11/25/2017 0901   GLUCOSEU Negative 07/13/2015 0841   HGBUR MODERATE (A) 11/25/2017 0901   BILIRUBINUR NEGATIVE 11/25/2017 0901   BILIRUBINUR Negative 07/13/2015 0841   KETONESUR NEGATIVE 11/25/2017 0901   PROTEINUR 100 (A) 11/25/2017 0901   UROBILINOGEN 0.2 07/13/2015 0841   NITRITE NEGATIVE 11/25/2017 0901   LEUKOCYTESUR NEGATIVE 11/25/2017 0901   LEUKOCYTESUR Negative 07/13/2015 0841     STUDIES: No results found.  ASSESSMENT: 69 y.o. Barbara Parks, Barbara Parks woman with stage IV breast cancer, on BOLERO-4 trial  (1) status post right mastectomy and sentinel lymph node sampling 03/22/2004 for a right upper-outer quadrant pT2 pN1, stage IIB invasive ductal carcinoma with lobular features, grade 2, estrogen receptor and progesterone receptor positive, HER-2 negative, with an MIB-1 of 9% ([id number] and [id number])  (2) addition all right axillary lymph node sampling 05/07/2004 showed 2 benign lymph nodes (4 lymph nodes previously removed, so total was one positive lymph node out of 6; [id number])  (3) the patient was evaluated by radiation oncology; no postmastectomy radiation recommended  (4) adjuvant chemotherapy with dose dense doxorubicin and cyclophosphamide x4 cycles (first cycle delayed one week) followed by dose dense paclitaxel x4 was completed 09/18/2004  (5) tamoxifen started March 2006, discontinued 2009  METASTATIC DISEASE: February 2013 (6) presenting with a large pericardial effusion, large right pleural effusion and possible right middle lobe bronchial obstruction February 2013, status post pericardial window placement, fiberoptic bronchoscopy and right Pleurx placement 09/11/2011, with biopsy of the bronchus intermedius and pericardium positive for metastatic breast cancer, estrogen receptor 91% positive with moderate staining intensity, progesterone receptor 100% positive with strong staining intensity, with an MIB-1 of 35%, and no HER-2 amplification, the signals ratio being 1.37 (SZA [id number])  (7) enrolled in BOLERO-4 trial 10/10/2011, receiving letrozole and everolimus  (a) two Parks areas of enhancement in the cerebellum noted by brain MRI 10/03/2011 were no longer apparent on repeat MRI 08/21/2012-- most recent brain MRI 04/01/2014 showed no evidence of intracranial metastatic disease  (b) sclerotic lesions in left iliac bone and sacrum have not been biopsied; stable; to  start zolendronate after patient updates her dental care (extraction planned)--never started  (c) RLL lung nodule, stable (rescanned 07/12/2014 and 08/11/2014) R hilar and subcarinal lymph nodes: stable  (d) CT of the chest:03/25/2018, stable  (e) CT Angio 12/12/2018 stable  (f) off BOLERO trial (trial ended) as of July 2020, but continuing on study drugs  (g) letrozole and everolimus discontinued January 2021 with disease progression  (8) additional problems:  (a) hepatic steatosis  (b) COPD/ emphysema/ asthma  (c) advanced L hip osteoarthritis, status post left total hip replacement 06/07/2015  (d) aortoiliac atherosclerosis  (e) dental evaluation pending w possible dental extractions  (f) likely thalassemia trait  (g) hyperlipidemia  (9) Bone density concerns: DEXA scan 08/11/2013 was normal  (a) repeat DEXA scan 04/09/2018 shows a T score of -1.8, osteopenia  (10) disease monitoring:  (a) PET scan 12/22/2018 shows no hypermetabolic soft tissue metastases but multifocal bony metastasis  (b) denosumab/Xgeva started on 02/25/2019, repeated every 28 days (delayed due to dental extraction healing issues)  (c) bone scan 05/13/2019 serves as baseline study with increased activity in the right scapular, lower ribs and right femoral head (stable  compared to PET scan of May 2020  (d) bone scan 07/09/2019 stable  (e) CT chest 07/09/2019 shows stable lung lesions but emerging left liver lobe 0.7 cm lesion  (f) liver MRI 08/03/2019 shows approximately 8 liver lesions, the largest measuring 1.8 cm.  (g) liver biopsy 08/06/2019 confirms metastatic breast cancer, estrogen and progesterone receptor positive, with an MIB-1 of 5% and HER-2 not amplified  (11) denosumab/Xgeva begun 02/25/2019 repeated every 4 weeks  (12) fulvestrant started 08/24/2019  (a) palbociclib 125 mg daily 21 days on 7 days off started 08/25/2019  Restaging due in 01/2020 with MRI liver and chest xray     PLAN: Chenise  continues on Fulvestrant/xgeva given every four weeks, along with Palbociclib that she is taking 21 days on and 7 days off.  She is tolerating this treatment well.  Her WBC has remained stable and she has not had any neutropenia.  She will continue this treatment until her restaging which is due in July with a chest xray and MRI of the liver.    We talked about her hair thinning.  I recommended that she buy a high quality shampoo and use a soft bristle brush, in order to avoid breakage.  This is secondary to her treatment.  Her weight gain is likely due to hormonal changes in her body, and we talked about healthy diet and exercise.    Her tumor markers have indicated an early response.  Today's numbers are pending.  We are following these every 4 weeks as well.    [place] will return in 4 weeks for labs, f/u, and her next injections.  She was recommended to continue with the appropriate pandemic precautions. She knows to call for any questions that may arise between now and her next appointment.  We are happy to see her sooner if needed.   Total encounter time: 20 minutes*    Wilber Bihari, NP 12/14/19 3:27 PM Medical Oncology and Hematology [facility] [place], [place] [address] Tel. [phone]    Fax. [phone]    *Total Encounter Time as defined by the Centers for Medicare and Medicaid Services includes, in addition to the face-to-face time of a patient visit (documented in the note above) non-face-to-face time: obtaining and reviewing outside history, ordering and reviewing medications, tests or procedures, care coordination (communications with other health care professionals or caregivers) and documentation in the medical record.

## 2019-12-14 NOTE — Patient Instructions (Signed)
Denosumab injection What is this medicine? DENOSUMAB (den oh sue mab) slows bone breakdown. Prolia is used to treat osteoporosis in women after menopause and in men, and in people who are taking corticosteroids for 6 months or more. Xgeva is used to treat a high calcium level due to cancer and to prevent bone fractures and other bone problems caused by multiple myeloma or cancer bone metastases. Xgeva is also used to treat giant cell tumor of the bone. This medicine may be used for other purposes; ask your health care provider or pharmacist if you have questions. COMMON BRAND NAME(S): Prolia, XGEVA What should I tell my health care provider before I take this medicine? They need to know if you have any of these conditions:  dental disease  having surgery or tooth extraction  infection  kidney disease  low levels of calcium or Vitamin D in the blood  malnutrition  on hemodialysis  skin conditions or sensitivity  thyroid or parathyroid disease  an unusual reaction to denosumab, other medicines, foods, dyes, or preservatives  pregnant or trying to get pregnant  breast-feeding How should I use this medicine? This medicine is for injection under the skin. It is given by a health care professional in a hospital or clinic setting. A special MedGuide will be given to you before each treatment. Be sure to read this information carefully each time. For Prolia, talk to your pediatrician regarding the use of this medicine in children. Special care may be needed. For Xgeva, talk to your pediatrician regarding the use of this medicine in children. While this drug may be prescribed for children as young as 13 years for selected conditions, precautions do apply. Overdosage: If you think you have taken too much of this medicine contact a poison control center or emergency room at once. NOTE: This medicine is only for you. Do not share this medicine with others. What if I miss a dose? It is  important not to miss your dose. Call your doctor or health care professional if you are unable to keep an appointment. What may interact with this medicine? Do not take this medicine with any of the following medications:  other medicines containing denosumab This medicine may also interact with the following medications:  medicines that lower your chance of fighting infection  steroid medicines like prednisone or cortisone This list may not describe all possible interactions. Give your health care provider a list of all the medicines, herbs, non-prescription drugs, or dietary supplements you use. Also tell them if you smoke, drink alcohol, or use illegal drugs. Some items may interact with your medicine. What should I watch for while using this medicine? Visit your doctor or health care professional for regular checks on your progress. Your doctor or health care professional may order blood tests and other tests to see how you are doing. Call your doctor or health care professional for advice if you get a fever, chills or sore throat, or other symptoms of a cold or flu. Do not treat yourself. This drug may decrease your body's ability to fight infection. Try to avoid being around people who are sick. You should make sure you get enough calcium and vitamin D while you are taking this medicine, unless your doctor tells you not to. Discuss the foods you eat and the vitamins you take with your health care professional. See your dentist regularly. Brush and floss your teeth as directed. Before you have any dental work done, tell your dentist you are   receiving this medicine. Do not become pregnant while taking this medicine or for 5 months after stopping it. Talk with your doctor or health care professional about your birth control options while taking this medicine. Women should inform their doctor if they wish to become pregnant or think they might be pregnant. There is a potential for serious side  effects to an unborn child. Talk to your health care professional or pharmacist for more information. What side effects may I notice from receiving this medicine? Side effects that you should report to your doctor or health care professional as soon as possible:  allergic reactions like skin rash, itching or hives, swelling of the face, lips, or tongue  bone pain  breathing problems  dizziness  jaw pain, especially after dental work  redness, blistering, peeling of the skin  signs and symptoms of infection like fever or chills; cough; sore throat; pain or trouble passing urine  signs of low calcium like fast heartbeat, muscle cramps or muscle pain; pain, tingling, numbness in the hands or feet; seizures  unusual bleeding or bruising  unusually weak or tired Side effects that usually do not require medical attention (report to your doctor or health care professional if they continue or are bothersome):  constipation  diarrhea  headache  joint pain  loss of appetite  muscle pain  runny nose  tiredness  upset stomach This list may not describe all possible side effects. Call your doctor for medical advice about side effects. You may report side effects to FDA at 1-800-FDA-1088. Where should I keep my medicine? This medicine is only given in a clinic, doctor's office, or other health care setting and will not be stored at home. NOTE: This sheet is a summary. It may not cover all possible information. If you have questions about this medicine, talk to your doctor, pharmacist, or health care provider.  2020 [employer] Standard (2017-11-21 16:10:44) Fulvestrant injection What is this medicine? FULVESTRANT (ful VES trant) blocks the effects of estrogen. It is used to treat breast cancer. This medicine may be used for other purposes; ask your health care provider or pharmacist if you have questions. COMMON BRAND NAME(S): FASLODEX What should I tell my health care  provider before I take this medicine? They need to know if you have any of these conditions:  bleeding disorders  liver disease  low blood counts, like low white cell, platelet, or red cell counts  an unusual or allergic reaction to fulvestrant, other medicines, foods, dyes, or preservatives  pregnant or trying to get pregnant  breast-feeding How should I use this medicine? This medicine is for injection into a muscle. It is usually given by a health care professional in a hospital or clinic setting. Talk to your pediatrician regarding the use of this medicine in children. Special care may be needed. Overdosage: If you think you have taken too much of this medicine contact a poison control center or emergency room at once. NOTE: This medicine is only for you. Do not share this medicine with others. What if I miss a dose? It is important not to miss your dose. Call your doctor or health care professional if you are unable to keep an appointment. What may interact with this medicine?  medicines that treat or prevent blood clots like warfarin, enoxaparin, dalteparin, apixaban, dabigatran, and rivaroxaban This list may not describe all possible interactions. Give your health care provider a list of all the medicines, herbs, non-prescription drugs, or dietary supplements you use.   Also tell them if you smoke, drink alcohol, or use illegal drugs. Some items may interact with your medicine. What should I watch for while using this medicine? Your condition will be monitored carefully while you are receiving this medicine. You will need important blood work done while you are taking this medicine. Do not become pregnant while taking this medicine or for at least 1 year after stopping it. Women of child-bearing potential will need to have a negative pregnancy test before starting this medicine. Women should inform their doctor if they wish to become pregnant or think they might be pregnant. There is  a potential for serious side effects to an unborn child. Men should inform their doctors if they wish to father a child. This medicine may lower sperm counts. Talk to your health care professional or pharmacist for more information. Do not breast-feed an infant while taking this medicine or for 1 year after the last dose. What side effects may I notice from receiving this medicine? Side effects that you should report to your doctor or health care professional as soon as possible:  allergic reactions like skin rash, itching or hives, swelling of the face, lips, or tongue  feeling faint or lightheaded, falls  pain, tingling, numbness, or weakness in the legs  signs and symptoms of infection like fever or chills; cough; flu-like symptoms; sore throat  vaginal bleeding Side effects that usually do not require medical attention (report to your doctor or health care professional if they continue or are bothersome):  aches, pains  constipation  diarrhea  headache  hot flashes  nausea, vomiting  pain at site where injected  stomach pain This list may not describe all possible side effects. Call your doctor for medical advice about side effects. You may report side effects to FDA at 1-800-FDA-1088. Where should I keep my medicine? This drug is given in a hospital or clinic and will not be stored at home. NOTE: This sheet is a summary. It may not cover all possible information. If you have questions about this medicine, talk to your doctor, pharmacist, or health care provider.  2020 [employer] Standard (2017-10-23 11:34:41)  

## 2019-12-14 NOTE — Progress Notes (Addendum)
Error

## 2019-12-15 ENCOUNTER — Telehealth: Payer: Self-pay | Admitting: Adult Health

## 2019-12-15 ENCOUNTER — Encounter: Payer: Self-pay | Admitting: Adult Health

## 2019-12-15 LAB — CANCER ANTIGEN 27.29: CA 27.29: 302.1 U/mL — ABNORMAL HIGH (ref 0.0–38.6)

## 2019-12-15 NOTE — Telephone Encounter (Signed)
No 5/18 LOS. No changes made to pt's schedule.

## 2019-12-20 DIAGNOSIS — E1165 Type 2 diabetes mellitus with hyperglycemia: Secondary | ICD-10-CM | POA: Diagnosis not present

## 2019-12-20 DIAGNOSIS — I700 Atherosclerosis of aorta: Secondary | ICD-10-CM | POA: Diagnosis not present

## 2019-12-20 DIAGNOSIS — E785 Hyperlipidemia, unspecified: Secondary | ICD-10-CM | POA: Diagnosis not present

## 2019-12-20 DIAGNOSIS — G722 Myopathy due to other toxic agents: Secondary | ICD-10-CM | POA: Diagnosis not present

## 2019-12-20 DIAGNOSIS — J449 Chronic obstructive pulmonary disease, unspecified: Secondary | ICD-10-CM | POA: Diagnosis not present

## 2019-12-20 DIAGNOSIS — Z5181 Encounter for therapeutic drug level monitoring: Secondary | ICD-10-CM | POA: Diagnosis not present

## 2019-12-20 DIAGNOSIS — Z0000 Encounter for general adult medical examination without abnormal findings: Secondary | ICD-10-CM | POA: Diagnosis not present

## 2019-12-20 DIAGNOSIS — C7951 Secondary malignant neoplasm of bone: Secondary | ICD-10-CM | POA: Diagnosis not present

## 2020-01-11 ENCOUNTER — Inpatient Hospital Stay (HOSPITAL_BASED_OUTPATIENT_CLINIC_OR_DEPARTMENT_OTHER): Payer: Medicare Other | Admitting: Adult Health

## 2020-01-11 ENCOUNTER — Encounter: Payer: Self-pay | Admitting: Adult Health

## 2020-01-11 ENCOUNTER — Inpatient Hospital Stay: Payer: Medicare Other | Attending: Oncology

## 2020-01-11 ENCOUNTER — Inpatient Hospital Stay: Payer: Medicare Other

## 2020-01-11 ENCOUNTER — Other Ambulatory Visit: Payer: Self-pay

## 2020-01-11 VITALS — BP 110/53 | HR 94 | Temp 98.3°F | Resp 18 | Ht 64.5 in | Wt 146.3 lb

## 2020-01-11 DIAGNOSIS — C787 Secondary malignant neoplasm of liver and intrahepatic bile duct: Secondary | ICD-10-CM

## 2020-01-11 DIAGNOSIS — C7951 Secondary malignant neoplasm of bone: Secondary | ICD-10-CM

## 2020-01-11 DIAGNOSIS — Z170 Estrogen receptor positive status [ER+]: Secondary | ICD-10-CM

## 2020-01-11 DIAGNOSIS — C50911 Malignant neoplasm of unspecified site of right female breast: Secondary | ICD-10-CM

## 2020-01-11 DIAGNOSIS — C7800 Secondary malignant neoplasm of unspecified lung: Secondary | ICD-10-CM

## 2020-01-11 DIAGNOSIS — Z5111 Encounter for antineoplastic chemotherapy: Secondary | ICD-10-CM | POA: Insufficient documentation

## 2020-01-11 DIAGNOSIS — I3131 Malignant pericardial effusion in diseases classified elsewhere: Secondary | ICD-10-CM

## 2020-01-11 DIAGNOSIS — C50411 Malignant neoplasm of upper-outer quadrant of right female breast: Secondary | ICD-10-CM

## 2020-01-11 LAB — CBC WITH DIFFERENTIAL/PLATELET
Abs Immature Granulocytes: 0.02 10*3/uL (ref 0.00–0.07)
Basophils Absolute: 0.1 10*3/uL (ref 0.0–0.1)
Basophils Relative: 2 %
Eosinophils Absolute: 0 10*3/uL (ref 0.0–0.5)
Eosinophils Relative: 1 %
HCT: 41 % (ref 36.0–46.0)
Hemoglobin: 13.7 g/dL (ref 12.0–15.0)
Immature Granulocytes: 0 %
Lymphocytes Relative: 20 %
Lymphs Abs: 1 10*3/uL (ref 0.7–4.0)
MCH: 35.1 pg — ABNORMAL HIGH (ref 26.0–34.0)
MCHC: 33.4 g/dL (ref 30.0–36.0)
MCV: 105.1 fL — ABNORMAL HIGH (ref 80.0–100.0)
Monocytes Absolute: 0.7 10*3/uL (ref 0.1–1.0)
Monocytes Relative: 15 %
Neutro Abs: 3.1 10*3/uL (ref 1.7–7.7)
Neutrophils Relative %: 62 %
Platelets: 153 10*3/uL (ref 150–400)
RBC: 3.9 MIL/uL (ref 3.87–5.11)
RDW: 14.1 % (ref 11.5–15.5)
WBC: 5 10*3/uL (ref 4.0–10.5)
nRBC: 0 % (ref 0.0–0.2)

## 2020-01-11 LAB — COMPREHENSIVE METABOLIC PANEL
ALT: 11 U/L (ref 0–44)
AST: 15 U/L (ref 15–41)
Albumin: 3.7 g/dL (ref 3.5–5.0)
Alkaline Phosphatase: 68 U/L (ref 38–126)
Anion gap: 9 (ref 5–15)
BUN: 25 mg/dL — ABNORMAL HIGH (ref 8–23)
CO2: 23 mmol/L (ref 22–32)
Calcium: 9.3 mg/dL (ref 8.9–10.3)
Chloride: 107 mmol/L (ref 98–111)
Creatinine, Ser: 1.13 mg/dL — ABNORMAL HIGH (ref 0.44–1.00)
GFR calc Af Amer: 58 mL/min — ABNORMAL LOW (ref >60)
GFR calc non Af Amer: 50 mL/min — ABNORMAL LOW (ref >60)
Glucose, Bld: 121 mg/dL — ABNORMAL HIGH (ref 70–99)
Potassium: 4.6 mmol/L (ref 3.5–5.1)
Sodium: 139 mmol/L (ref 135–145)
Total Bilirubin: 0.3 mg/dL (ref 0.3–1.2)
Total Protein: 7.2 g/dL (ref 6.5–8.1)

## 2020-01-11 MED ORDER — FULVESTRANT 250 MG/5ML IM SOLN
INTRAMUSCULAR | Status: AC
  Filled 2020-01-11: qty 10

## 2020-01-11 MED ORDER — FULVESTRANT 250 MG/5ML IM SOLN
500.0000 mg | Freq: Once | INTRAMUSCULAR | Status: AC
  Administered 2020-01-11: 500 mg via INTRAMUSCULAR

## 2020-01-11 MED ORDER — DENOSUMAB 120 MG/1.7ML ~~LOC~~ SOLN
120.0000 mg | Freq: Once | SUBCUTANEOUS | Status: AC
  Administered 2020-01-11: 120 mg via SUBCUTANEOUS

## 2020-01-11 MED ORDER — DENOSUMAB 120 MG/1.7ML ~~LOC~~ SOLN
SUBCUTANEOUS | Status: AC
  Filled 2020-01-11: qty 1.7

## 2020-01-11 NOTE — Progress Notes (Signed)
[place]  [phone] [phone]    ID: Barbara Parks DOB: August 23, 1950  MR#: [id number]  WHQ#:[id number]  Patient Care Team: [name], MD as PCP - General (Family Medicine) Jerline Pain, MD (Cardiology) Gaye Pollack, MD (Cardiothoracic Surgery) Magrinat, Virgie Dad, MD as Consulting Physician (Oncology) OTHER MD: Dr Erroll Luna- Merlene Laughter, DDS; Gaynelle Arabian MD   CHIEF COMPLAINT: metastatic breast cancer, formerly on BOLERO study  CURRENT TREATMENT: Fulvestrant, palbociclib, denosumab/Xgeva   INTERVAL HISTORY: Barbara Parks returns today for follow-up and treatment of her metastatic breast cancer.  She was changed to Fulvestrant.and Palbociclib, starting 08/24/2019 and continues on Xgeva.  She receives fulvestrant every 4 weeks.  She tolerates this well and notes hot flashes and some hair thinning.    She also continues on Palbociclib.  She is due to start this back .    She is due for [facility] today and continues to tolerate this well.    Her most recent restaging was completed as follows:    Liver MRI performed on 11/10/2019 showed: slight decrease in size of hepatic metastatic lesions; small right effusion, increased from prior; increased visibility of bony lesions; stable pancreatic and biliary ductal dilation.  Chest CT performed on 11/11/2019 showed: small right pleural effusion, increased from prior, with some enhancement along the parietal pleural margin; stable, continued obstruction of superior bronchus of right lower lobe; unchanged lytic lesions.  Bone scan performed the same day showed stable previously demonstrated metastatic lesions with no new lesions.   REVIEW OF SYSTEMS: Barbara Parks is doing well today.  She has a great energy level, a good appetite.  Her weight is stable.  She continues on the treatments above with her only concern being hair thinning. She has no new pain, fever, chills, cough, shortness of breath, bowel/bladder  changes, headaches or any other concerns.  A detailed ROS was otherwise non contributory.    BREAST CANCER HISTORY: From Dr. Collier [name]'s original intake note 04/13/2004:  "This woman has been in good health all of her life. She recently moved from [place] to work here.  She palpated a mass at about the 12 o'clock position in mid-July. She has not noticed any nipple retraction or skin changes.  She was seen by her primary care doctor who subsequently referred her for a mammogram.  Mammogram was performed on 03/01/04. This demonstrated a spiculated 2 cm mass at the 12 o'clock position in the right breast.  Upper outer quadrant of the left breast shows some distortion.   Physical exam at that time showed a firm, nontender nodule at the 12 o'clock position in the right breast, 5 cm from the nipple.  Ultrasound of this area showed a hypoechoic ill-defined mass, measuring 2.2 x 1.3 x 1.4 cm.  Physical exam of the left breast showed general vague thickening, upper outer quadrant of the left breast with a discrete palpable mass. The ultrasound performed showed a single hypoechoic ill-defined nodule at the 1 o'clock position, measuring 7 x 5 x 6 mm.    She had biopsies of both lesions on 03/02/03.  Needle core biopsy of the lesion on the right breast revealed invasive mammary carcinoma.  Needle core biopsy of the left breast showed a complex fibroadenoma.  Prognostic panel of the lesion on the right breast showed it to be ER positive at 73%, PR positive at 90% and proliferative index 9%, HER-2 was 1+.  Patient was referred to Dr. Annamaria Boots, who performed a simple mastectomy with sentinel  lymph node evaluation on 03/22/04.  Final pathology showed this to be an invasive ductal carcinoma  with lobular features, measuring 2.2 cm, grade 2 of 3.  Margins negative for carcinoma.  Invasive ductal carcinoma was extended to involve deep dermis of the nipple.  Lymphovascular invasion was identified.  Total of 4 sentinel lymph  nodes were evaluated.  Touch imprints at the time of the OR was felt to be negative.  Subsequent evaluation showed a 5 mm focus of metastatic carcinoma in one of the four lymph nodes on microscopic after sectioning.  There was extracapsular extension  of one lymph node as well. The remaining three lymph nodes were all negative."  The patient's subsequent history is detailed above.   PAST MEDICAL HISTORY: Past Medical History:  Diagnosis Date  . Arthritis    left hip  . Asthma   . breast ca 2005   breast/chemo R mastectomy  . Dermatitis   . Diabetes mellitus without complication ([place])   . GERD (gastroesophageal reflux disease)   . Hypercholesteremia   . Metastasis to lung ([place]) dx'd 08/2011  . Osteopenia due to cancer therapy 09/09/2013  . Palpitations   . Personal history of chemotherapy   . Shortness of breath     PAST SURGICAL HISTORY: Past Surgical History:  Procedure Laterality Date  . BREAST SURGERY  2005   right  . CATARACT EXTRACTION W/PHACO Left 01/18/2014   Procedure: CATARACT EXTRACTION PHACO AND INTRAOCULAR LENS PLACEMENT (IOC);  Surgeon: Elta Guadeloupe T. Gershon Crane, MD;  Location: [facility];  Service: Ophthalmology;  Laterality: Left;  CDE 15.79  . CATARACT EXTRACTION W/PHACO Right 02/08/2014   Procedure: CATARACT EXTRACTION PHACO AND INTRAOCULAR LENS PLACEMENT (IOC);  Surgeon: Elta Guadeloupe T. Gershon Crane, MD;  Location: [facility];  Service: Ophthalmology;  Laterality: Right;  CDE 4.41  . CHEST TUBE INSERTION  09/11/2011   Procedure: INSERTION PLEURAL DRAINAGE CATHETER;  Surgeon: Gaye Pollack, MD;  Location: [place];  Service: Thoracic;  Laterality: Right;  . MASTECTOMY Right   . PERICARDIAL WINDOW  09/11/2011   Procedure: PERICARDIAL WINDOW;  Surgeon: Gaye Pollack, MD;  Location: [facility];  Service: Thoracic;  Laterality: N/A;  . REMOVAL OF PLEURAL DRAINAGE CATHETER  12/19/2011   Procedure: REMOVAL OF PLEURAL DRAINAGE CATHETER;  Surgeon: Gaye Pollack, MD;  Location: [place];  Service: Thoracic;   Laterality: Right;  TO BE DONE IN MINOR ROOM, SHORT STAY  . [place]  . TOTAL HIP ARTHROPLASTY Left 06/07/2015   Procedure: LEFT TOTAL HIP ARTHROPLASTY ANTERIOR APPROACH;  Surgeon: Gaynelle Arabian, MD;  Location: [facility];  Service: Orthopedics;  Laterality: Left;  Marland Kitchen VIDEO BRONCHOSCOPY  09/11/2011   Procedure: VIDEO BRONCHOSCOPY;  Surgeon: Gaye Pollack, MD;  Location: [facility] OR;  Service: Thoracic;  Laterality: N/A;    FAMILY HISTORY Family History  Problem Relation Age of Onset  . Cancer Mother        breast  . Heart disease Father   . Diabetes Other   . Anesthesia problems Neg Hx    The patient's mother was diagnosed with breast cancer at age 69, she died age 75 from congestive heart failure.The patient's father died from heart disease at age 75.  She has one sister alive & well.  Two brothers alive & well, one with diabetes. The patient's sister was also diagnosed with breast cancer, and was tested for the BRCA gene, and was negative. The patient herself has not been tested. There is no history of ovarian  cancer in the family   GYNECOLOGIC HISTORY:  No LMP recorded. Patient is postmenopausal. Menarche age 65, the patient is GX P0. She stopped having periods with chemotherapy in 2005. 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 She was a Archivist. She is single, lives alone with her Shitzu-poodle Sammie.. Family is all in the [place] area.     ADVANCED DIRECTIVES: Not in place. At the 02/23/2014 visit the patient was given the appropriate documents to complete and notarize at her discretion. She tells me she is planning to name her sister, Billie Ruddy, as healthcare power of attorney. [name] can be reached at [phone]   HEALTH MAINTENANCE: Social History   Tobacco Use  . Smoking status: Former Smoker    Packs/day: 1.50    Years: 30.00    Pack years:  45.00    Types: Cigarettes    Quit date: 09/10/2007    Years since quitting: 12.3  . Smokeless tobacco: Never Used  Substance Use Topics  . Alcohol use: No  . Drug use: No    Colonoscopy:  PAP:  Bone density: 04/09/2018, T score -1.8  Lipid panel:  Allergies  Allergen Reactions  . Aspirin     REACTION: upset stomach  . Latex Other (See Comments)    Blistering and skin peels off  . Nsaids Nausea And Vomiting    Extreme nausea and vomiting    Current Outpatient Medications  Medication Sig Dispense Refill  . cholecalciferol (VITAMIN D3) 25 MCG (1000 UT) tablet Take 2 tablets (2,000 Units total) by mouth daily. 180 tablet 4  . cyclobenzaprine (FLEXERIL) 10 MG tablet Take 1 tablet (10 mg total) by mouth 2 (two) times daily as needed for muscle spasms. 20 tablet 0  . gemfibrozil (LOPID) 600 MG tablet TAKE 1 TABLET (600 MG TOTAL) BY MOUTH 2 (TWO) TIMES DAILY BEFORE A MEAL. 180 tablet 1  . loperamide (IMODIUM) 2 MG capsule Take 2 mg by mouth 4 (four) times daily as needed for diarrhea or loose stools. Reported on 11/30/2015    . metFORMIN (GLUCOPHAGE-XR) 500 MG 24 hr tablet Take 1 tablet (500 mg total) by mouth 3 (three) times daily before meals.  3  . palbociclib (IBRANCE) 125 MG tablet Take 1 tablet (125 mg total) by mouth daily. Take for 21 days on, 7 days off, repeat every 28 days. 21 tablet 6  . traMADol (ULTRAM) 50 MG tablet Take 0.5-1 tablets (25-50 mg total) by mouth every 6 (six) hours as needed. 30 tablet 0   No current facility-administered medications for this visit.    OBJECTIVE: White woman who appears stated age 69:   01/11/20 1338  BP: (!) 110/53  Pulse: 94  Resp: 18  Temp: 98.3 F (36.8 C)  SpO2: 98%     Body mass index is 24.72 kg/m.   Filed Weights   01/11/20 1338  Weight: 146 lb 4.8 oz (66.4 kg)   ECOG FS:1 - Symptomatic but completely ambulatory GENERAL: Patient is a well appearing female in no acute distress HEENT:  Sclerae anicteric.  Mask in  place.  Neck is supple.  NODES:  No cervical, supraclavicular, or axillary lymphadenopathy palpated.  BREAST EXAM:  Deferred. LUNGS:  Clear to auscultation bilaterally.  No wheezes or rhonchi. HEART:  Regular rate and rhythm. No murmur appreciated. ABDOMEN:  Soft, nontender.  Positive, normoactive bowel sounds. No  organomegaly palpated. MSK:  No focal spinal tenderness to palpation. Full range of motion bilaterally in the upper extremities. EXTREMITIES:  No peripheral edema.   SKIN:  Clear with no obvious rashes or skin changes. No nail dyscrasia. NEURO:  Nonfocal. Well oriented.  Appropriate affect.    LAB  RESULTS:  CMP     Component Value Date/Time   NA 139 01/11/2020 1312   NA 139 07/08/2017 0942   K 4.6 01/11/2020 1312   K 3.8 07/08/2017 0942   CL 107 01/11/2020 1312   CL 109 (H) 12/31/2012 0940   CO2 23 01/11/2020 1312   CO2 18 (L) 07/08/2017 0942   GLUCOSE 121 (H) 01/11/2020 1312   GLUCOSE 156 (H) 07/08/2017 0942   GLUCOSE 127 (H) 12/31/2012 0940   BUN 25 (H) 01/11/2020 1312   BUN 17.7 07/08/2017 0942   CREATININE 1.13 (H) 01/11/2020 1312   CREATININE 1.18 (H) 12/25/2017 0959   CREATININE 1.0 07/08/2017 0942   CALCIUM 9.3 01/11/2020 1312   CALCIUM 10.4 07/08/2017 0942   PROT 7.2 01/11/2020 1312   PROT 7.7 07/08/2017 0942   ALBUMIN 3.7 01/11/2020 1312   ALBUMIN 3.4 (L) 07/08/2017 0942   AST 15 01/11/2020 1312   AST 50 (H) 12/25/2017 0959   AST 54 (H) 07/08/2017 0942   ALT 11 01/11/2020 1312   ALT 53 12/25/2017 0959   ALT 39 07/08/2017 0942   ALKPHOS 68 01/11/2020 1312   ALKPHOS 232 (H) 07/08/2017 0942   BILITOT 0.3 01/11/2020 1312   BILITOT 0.3 12/25/2017 0959   BILITOT 0.42 07/08/2017 0942   GFRNONAA 50 (L) 01/11/2020 1312   GFRNONAA 47 (L) 12/25/2017 0959   GFRAA 58 (L) 01/11/2020 1312   GFRAA 54 (L) 12/25/2017 0959    I No results found for: SPEP  Lab Results  Component Value Date   WBC 5.0 01/11/2020   NEUTROABS 3.1 01/11/2020   HGB 13.7  01/11/2020   HCT 41.0 01/11/2020   MCV 105.1 (H) 01/11/2020   PLT 153 01/11/2020      Chemistry      Component Value Date/Time   NA 139 01/11/2020 1312   NA 139 07/08/2017 0942   K 4.6 01/11/2020 1312   K 3.8 07/08/2017 0942   CL 107 01/11/2020 1312   CL 109 (H) 12/31/2012 0940   CO2 23 01/11/2020 1312   CO2 18 (L) 07/08/2017 0942   BUN 25 (H) 01/11/2020 1312   BUN 17.7 07/08/2017 0942   CREATININE 1.13 (H) 01/11/2020 1312   CREATININE 1.18 (H) 12/25/2017 0959   CREATININE 1.0 07/08/2017 0942      Component Value Date/Time   CALCIUM 9.3 01/11/2020 1312   CALCIUM 10.4 07/08/2017 0942   ALKPHOS 68 01/11/2020 1312   ALKPHOS 232 (H) 07/08/2017 0942   AST 15 01/11/2020 1312   AST 50 (H) 12/25/2017 0959   AST 54 (H) 07/08/2017 0942   ALT 11 01/11/2020 1312   ALT 53 12/25/2017 0959   ALT 39 07/08/2017 0942   BILITOT 0.3 01/11/2020 1312   BILITOT 0.3 12/25/2017 0959   BILITOT 0.42 07/08/2017 0942       Lab Results  Component Value Date   LABCA2 58 (H) 09/14/2012    No components found for: [id number]  No results for input(s): INR in the last 168 hours.  Urinalysis    Component Value Date/Time   COLORURINE YELLOW 11/25/2017 0901   APPEARANCEUR HAZY (A) 11/25/2017 0901   LABSPEC 1.017 11/25/2017 0901  LABSPEC 1.030 07/13/2015 0841   PHURINE 5.0 11/25/2017 0901   GLUCOSEU NEGATIVE 11/25/2017 0901   GLUCOSEU Negative 07/13/2015 0841   HGBUR MODERATE (A) 11/25/2017 0901   BILIRUBINUR NEGATIVE 11/25/2017 0901   BILIRUBINUR Negative 07/13/2015 0841   KETONESUR NEGATIVE 11/25/2017 0901   PROTEINUR 100 (A) 11/25/2017 0901   UROBILINOGEN 0.2 07/13/2015 0841   NITRITE NEGATIVE 11/25/2017 0901   LEUKOCYTESUR NEGATIVE 11/25/2017 0901   LEUKOCYTESUR Negative 07/13/2015 0841    STUDIES: No results found.  ASSESSMENT: 69 y.o. Barbara Parks, Barbara Parks woman with stage IV breast cancer, on BOLERO-4 trial  (1) status post right mastectomy and sentinel lymph node sampling 03/22/2004  for a right upper-outer quadrant pT2 pN1, stage IIB invasive ductal carcinoma with lobular features, grade 2, estrogen receptor and progesterone receptor positive, HER-2 negative, with an MIB-1 of 9% ([id number] and [id number])  (2) addition all right axillary lymph node sampling 05/07/2004 showed 2 benign lymph nodes (4 lymph nodes previously removed, so total was one positive lymph node out of 6; [id number])  (3) the patient was evaluated by radiation oncology; no postmastectomy radiation recommended  (4) adjuvant chemotherapy with dose dense doxorubicin and cyclophosphamide x4 cycles (first cycle delayed one week) followed by dose dense paclitaxel x4 was completed 09/18/2004  (5) tamoxifen started March 2006, discontinued 2009  METASTATIC DISEASE: February 2013 (6) presenting with a large pericardial effusion, large right pleural effusion and possible right middle lobe bronchial obstruction February 2013, status post pericardial window placement, fiberoptic bronchoscopy and right Pleurx placement 09/11/2011, with biopsy of the bronchus intermedius and pericardium positive for metastatic breast cancer, estrogen receptor 91% positive with moderate staining intensity, progesterone receptor 100% positive with strong staining intensity, with an MIB-1 of 35%, and no HER-2 amplification, the signals ratio being 1.37 ([id number])  (7) enrolled in BOLERO-4 trial 10/10/2011, receiving letrozole and everolimus  (a) two small areas of enhancement in the cerebellum noted by brain MRI 10/03/2011 were no longer apparent on repeat MRI 08/21/2012-- most recent brain MRI 04/01/2014 showed no evidence of intracranial metastatic disease  (b) sclerotic lesions in left iliac bone and sacrum have not been biopsied; stable; to start zolendronate after patient updates her dental care (extraction planned)--never started  (c) RLL lung nodule, stable (rescanned 07/12/2014 and 08/11/2014) R hilar and subcarinal lymph nodes:  stable  (d) CT of the chest:03/25/2018, stable  (e) CT Angio 12/12/2018 stable  (f) off BOLERO trial (trial ended) as of July 2020, but continuing on study drugs  (g) letrozole and everolimus discontinued January 2021 with disease progression  (8) additional problems:  (a) hepatic steatosis  (b) COPD/ emphysema/ asthma  (c) advanced L hip osteoarthritis, status post left total hip replacement 06/07/2015  (d) aortoiliac atherosclerosis  (e) dental evaluation pending w possible dental extractions  (f) likely thalassemia trait  (g) hyperlipidemia  (9) Bone density concerns: DEXA scan 08/11/2013 was normal  (a) repeat DEXA scan 04/09/2018 shows a T score of -1.8, osteopenia  (10) disease monitoring:  (a) PET scan 12/22/2018 shows no hypermetabolic soft tissue metastases but multifocal bony metastasis  (b) denosumab/Xgeva started on 02/25/2019, repeated every 28 days (delayed due to dental extraction healing issues)  (c) bone scan 05/13/2019 serves as baseline study with increased activity in the right scapular, lower ribs and right femoral head (stable compared to PET scan of May 2020  (d) bone scan 07/09/2019 stable  (e) CT chest 07/09/2019 shows stable lung lesions but emerging left liver lobe 0.7 cm lesion  (f) liver MRI  08/03/2019 shows approximately 8 liver lesions, the largest measuring 1.8 cm.  (g) liver biopsy 08/06/2019 confirms metastatic breast cancer, estrogen and progesterone receptor positive, with an MIB-1 of 5% and HER-2 not amplified  (11) denosumab/Xgeva begun 02/25/2019 repeated every 4 weeks  (12) fulvestrant started 08/24/2019  (a) palbociclib 125 mg daily 21 days on 7 days off started 08/25/2019  Restaging due in 01/2020 with MRI liver and chest xray     PLAN: Maudie continues on Fulvestrant/xgeva given every four weeks, along with Palbociclib that she is taking 21 days on and 7 days off.  She is due to restart the Palbociclib at 166m/day on Friday, 01/13/2020.   This is also her 69th birthday.  Her WBC are normal, and other than hair thinning she is tolerating it quite well.  LDasjahas no signs of cancer progression.  We are following her tumor markers which have been declining.  She will undergo restaging in July, 2021 prior to her appt with Dr. MJana Hakim    I recommended continued healthy diet and exercise.  Her activity level has maintained along with her weight which is good.  LTaygenwill return in 4 weeks for labs, f/u, and her next injections.  She was recommended to continue with the appropriate pandemic precautions. She knows to call for any questions that may arise between now and her next appointment.  We are happy to see her sooner if needed.   Total encounter time: 20 minutes*    LWilber Bihari NP 01/11/20 3:03 PM Medical Oncology and Hematology [facility] [id number]. [phone]   Fax. [phone]   *Total Encounter Time as defined by the Centers for Medicare and Medicaid Services includes, in addition to the face-to-face time of a patient visit (documented in the note above) non-face-to-face time: obtaining and reviewing outside history, ordering and reviewing medications, tests or procedures, care coordination (communications with other health care professionals or caregivers) and documentation in the medical record.

## 2020-01-11 NOTE — Patient Instructions (Signed)
Denosumab injection What is this medicine? DENOSUMAB (den oh sue mab) slows bone breakdown. Prolia is used to treat osteoporosis in women after menopause and in men, and in people who are taking corticosteroids for 6 months or more. Xgeva is used to treat a high calcium level due to cancer and to prevent bone fractures and other bone problems caused by multiple myeloma or cancer bone metastases. Xgeva is also used to treat giant cell tumor of the bone. This medicine may be used for other purposes; ask your health care provider or pharmacist if you have questions. COMMON BRAND NAME(S): Prolia, XGEVA What should I tell my health care provider before I take this medicine? They need to know if you have any of these conditions:  dental disease  having surgery or tooth extraction  infection  kidney disease  low levels of calcium or Vitamin D in the blood  malnutrition  on hemodialysis  skin conditions or sensitivity  thyroid or parathyroid disease  an unusual reaction to denosumab, other medicines, foods, dyes, or preservatives  pregnant or trying to get pregnant  breast-feeding How should I use this medicine? This medicine is for injection under the skin. It is given by a health care professional in a hospital or clinic setting. A special MedGuide will be given to you before each treatment. Be sure to read this information carefully each time. For Prolia, talk to your pediatrician regarding the use of this medicine in children. Special care may be needed. For Xgeva, talk to your pediatrician regarding the use of this medicine in children. While this drug may be prescribed for children as young as 13 years for selected conditions, precautions do apply. Overdosage: If you think you have taken too much of this medicine contact a poison control center or emergency room at once. NOTE: This medicine is only for you. Do not share this medicine with others. What if I miss a dose? It is  important not to miss your dose. Call your doctor or health care professional if you are unable to keep an appointment. What may interact with this medicine? Do not take this medicine with any of the following medications:  other medicines containing denosumab This medicine may also interact with the following medications:  medicines that lower your chance of fighting infection  steroid medicines like prednisone or cortisone This list may not describe all possible interactions. Give your health care provider a list of all the medicines, herbs, non-prescription drugs, or dietary supplements you use. Also tell them if you smoke, drink alcohol, or use illegal drugs. Some items may interact with your medicine. What should I watch for while using this medicine? Visit your doctor or health care professional for regular checks on your progress. Your doctor or health care professional may order blood tests and other tests to see how you are doing. Call your doctor or health care professional for advice if you get a fever, chills or sore throat, or other symptoms of a cold or flu. Do not treat yourself. This drug may decrease your body's ability to fight infection. Try to avoid being around people who are sick. You should make sure you get enough calcium and vitamin D while you are taking this medicine, unless your doctor tells you not to. Discuss the foods you eat and the vitamins you take with your health care professional. See your dentist regularly. Brush and floss your teeth as directed. Before you have any dental work done, tell your dentist you are   receiving this medicine. Do not become pregnant while taking this medicine or for 5 months after stopping it. Talk with your doctor or health care professional about your birth control options while taking this medicine. Women should inform their doctor if they wish to become pregnant or think they might be pregnant. There is a potential for serious side  effects to an unborn child. Talk to your health care professional or pharmacist for more information. What side effects may I notice from receiving this medicine? Side effects that you should report to your doctor or health care professional as soon as possible:  allergic reactions like skin rash, itching or hives, swelling of the face, lips, or tongue  bone pain  breathing problems  dizziness  jaw pain, especially after dental work  redness, blistering, peeling of the skin  signs and symptoms of infection like fever or chills; cough; sore throat; pain or trouble passing urine  signs of low calcium like fast heartbeat, muscle cramps or muscle pain; pain, tingling, numbness in the hands or feet; seizures  unusual bleeding or bruising  unusually weak or tired Side effects that usually do not require medical attention (report to your doctor or health care professional if they continue or are bothersome):  constipation  diarrhea  headache  joint pain  loss of appetite  muscle pain  runny nose  tiredness  upset stomach This list may not describe all possible side effects. Call your doctor for medical advice about side effects. You may report side effects to FDA at 1-800-FDA-1088. Where should I keep my medicine? This medicine is only given in a clinic, doctor's office, or other health care setting and will not be stored at home. NOTE: This sheet is a summary. It may not cover all possible information. If you have questions about this medicine, talk to your doctor, pharmacist, or health care provider.  2020 [employer] Standard (2017-11-21 16:10:44) Fulvestrant injection What is this medicine? FULVESTRANT (ful VES trant) blocks the effects of estrogen. It is used to treat breast cancer. This medicine may be used for other purposes; ask your health care provider or pharmacist if you have questions. COMMON BRAND NAME(S): FASLODEX What should I tell my health care  provider before I take this medicine? They need to know if you have any of these conditions:  bleeding disorders  liver disease  low blood counts, like low white cell, platelet, or red cell counts  an unusual or allergic reaction to fulvestrant, other medicines, foods, dyes, or preservatives  pregnant or trying to get pregnant  breast-feeding How should I use this medicine? This medicine is for injection into a muscle. It is usually given by a health care professional in a hospital or clinic setting. Talk to your pediatrician regarding the use of this medicine in children. Special care may be needed. Overdosage: If you think you have taken too much of this medicine contact a poison control center or emergency room at once. NOTE: This medicine is only for you. Do not share this medicine with others. What if I miss a dose? It is important not to miss your dose. Call your doctor or health care professional if you are unable to keep an appointment. What may interact with this medicine?  medicines that treat or prevent blood clots like warfarin, enoxaparin, dalteparin, apixaban, dabigatran, and rivaroxaban This list may not describe all possible interactions. Give your health care provider a list of all the medicines, herbs, non-prescription drugs, or dietary supplements you use.   Also tell them if you smoke, drink alcohol, or use illegal drugs. Some items may interact with your medicine. What should I watch for while using this medicine? Your condition will be monitored carefully while you are receiving this medicine. You will need important blood work done while you are taking this medicine. Do not become pregnant while taking this medicine or for at least 1 year after stopping it. Women of child-bearing potential will need to have a negative pregnancy test before starting this medicine. Women should inform their doctor if they wish to become pregnant or think they might be pregnant. There is  a potential for serious side effects to an unborn child. Men should inform their doctors if they wish to father a child. This medicine may lower sperm counts. Talk to your health care professional or pharmacist for more information. Do not breast-feed an infant while taking this medicine or for 1 year after the last dose. What side effects may I notice from receiving this medicine? Side effects that you should report to your doctor or health care professional as soon as possible:  allergic reactions like skin rash, itching or hives, swelling of the face, lips, or tongue  feeling faint or lightheaded, falls  pain, tingling, numbness, or weakness in the legs  signs and symptoms of infection like fever or chills; cough; flu-like symptoms; sore throat  vaginal bleeding Side effects that usually do not require medical attention (report to your doctor or health care professional if they continue or are bothersome):  aches, pains  constipation  diarrhea  headache  hot flashes  nausea, vomiting  pain at site where injected  stomach pain This list may not describe all possible side effects. Call your doctor for medical advice about side effects. You may report side effects to FDA at 1-800-FDA-1088. Where should I keep my medicine? This drug is given in a hospital or clinic and will not be stored at home. NOTE: This sheet is a summary. It may not cover all possible information. If you have questions about this medicine, talk to your doctor, pharmacist, or health care provider.  2020 [employer] Standard (2017-10-23 11:34:41)  

## 2020-01-12 ENCOUNTER — Telehealth: Payer: Self-pay | Admitting: Adult Health

## 2020-01-12 LAB — CANCER ANTIGEN 27.29: CA 27.29: 288 U/mL — ABNORMAL HIGH (ref 0.0–38.6)

## 2020-01-12 NOTE — Telephone Encounter (Signed)
No 5/15 los. No changes made to pt's schedule.

## 2020-01-30 ENCOUNTER — Other Ambulatory Visit: Payer: Self-pay | Admitting: Oncology

## 2020-02-01 ENCOUNTER — Other Ambulatory Visit: Payer: Self-pay

## 2020-02-01 ENCOUNTER — Ambulatory Visit (HOSPITAL_COMMUNITY)
Admission: RE | Admit: 2020-02-01 | Discharge: 2020-02-01 | Disposition: A | Discharge status: Home / Self Care | Payer: Medicare Other | Source: Ambulatory Visit | Attending: Oncology | Admitting: Oncology

## 2020-02-01 DIAGNOSIS — I3131 Malignant pericardial effusion in diseases classified elsewhere: Secondary | ICD-10-CM

## 2020-02-01 DIAGNOSIS — C7800 Secondary malignant neoplasm of unspecified lung: Secondary | ICD-10-CM | POA: Diagnosis present

## 2020-02-01 DIAGNOSIS — Z170 Estrogen receptor positive status [ER+]: Secondary | ICD-10-CM | POA: Insufficient documentation

## 2020-02-01 DIAGNOSIS — I313 Pericardial effusion (noninflammatory): Secondary | ICD-10-CM | POA: Diagnosis present

## 2020-02-01 DIAGNOSIS — C50411 Malignant neoplasm of upper-outer quadrant of right female breast: Secondary | ICD-10-CM | POA: Insufficient documentation

## 2020-02-01 DIAGNOSIS — C50911 Malignant neoplasm of unspecified site of right female breast: Secondary | ICD-10-CM | POA: Insufficient documentation

## 2020-02-01 DIAGNOSIS — C801 Malignant (primary) neoplasm, unspecified: Secondary | ICD-10-CM | POA: Diagnosis present

## 2020-02-01 MED ORDER — GADOBUTROL 1 MMOL/ML IV SOLN
6.0000 mL | Freq: Once | INTRAVENOUS | Status: AC | PRN
  Administered 2020-02-01: 7 mL via INTRAVENOUS

## 2020-02-07 NOTE — Progress Notes (Signed)
[place]  [phone] [phone]    ID: Barbara Parks DOB: 04-Nov-1950  MR#: [id number]  MBT#:[id number]  Patient Care Team: [name], MD as PCP - General (Family Medicine) Jerline Pain, MD (Cardiology) Gaye Pollack, MD (Cardiothoracic Surgery) Channa Hazelett, Virgie Dad, MD as Consulting Physician (Oncology) OTHER MD: Dr Erroll Luna- Merlene Laughter, DDS; Gaynelle Arabian MD   CHIEF COMPLAINT: metastatic breast cancer, formerly on BOLERO study  CURRENT TREATMENT: Fulvestrant, palbociclib, denosumab/Xgeva   INTERVAL HISTORY: Barbara Parks returns today for follow-up and treatment of her metastatic breast cancer. She was last seen here on 01/11/2020.   She continues on fulvestrant, which was last received on 01/11/2020.  She tolerates this with no side effects that she is aware of and particularly with no particular discomfort on administration  She also continues on palbociclib.  She had some fatigue initially while starting this medication but as she has become more used to it she has normal activity.  She plays golf about 3 times a week and scored 39 on 9 holes and her most recent attempt.  She has noted that her hair is thinning out.  She also continues on denosumab/Xgeva, which was last received on 01/11/2020.  She has had no dental or other issues regarding this.  Since her last visit here, she underwent an abdomen MRI with and without contrast on 02/01/2020 revealing signs of skeletal metastases and hepatic metastatic lesions are unchanged. Again follow-up with hepatocyte specific contrast agent may be helpful for measurement purposes and lesion tracking as warranted.    REVIEW OF SYSTEMS: Barbara Parks has gained a little weight, which I think is a positive but she would rather not have.  She remains very active not only with golf but with family visits, yard work, and just being social.  She has not received the COVID-19 vaccine and does not intend to receive  it.  Detailed review of systems today was otherwise stable  BREAST CANCER HISTORY: From Dr. Collier [name]'s original intake note 04/13/2004:  "This woman has been in good health all of her life. She recently moved from [place] to work here.  She palpated a mass at about the 12 o'clock position in mid-July. She has not noticed any nipple retraction or skin changes.  She was seen by her primary care doctor who subsequently referred her for a mammogram.  Mammogram was performed on 03/01/04. This demonstrated a spiculated 2 cm mass at the 12 o'clock position in the right breast.  Upper outer quadrant of the left breast shows some distortion.   Physical exam at that time showed a firm, nontender nodule at the 12 o'clock position in the right breast, 5 cm from the nipple.  Ultrasound of this area showed a hypoechoic ill-defined mass, measuring 2.2 x 1.3 x 1.4 cm.  Physical exam of the left breast showed general vague thickening, upper outer quadrant of the left breast with a discrete palpable mass. The ultrasound performed showed a single hypoechoic ill-defined nodule at the 1 o'clock position, measuring 7 x 5 x 6 mm.    She had biopsies of both lesions on 03/02/03.  Needle core biopsy of the lesion on the right breast revealed invasive mammary carcinoma.  Needle core biopsy of the left breast showed a complex fibroadenoma.  Prognostic panel of the lesion on the right breast showed it to be ER positive at 73%, PR positive at 90% and proliferative index 9%, HER-2 was 1+.  Patient was referred to Dr. Annamaria Boots, who  performed a simple mastectomy with sentinel lymph node evaluation on 03/22/04.  Final pathology showed this to be an invasive ductal carcinoma  with lobular features, measuring 2.2 cm, grade 2 of 3.  Margins negative for carcinoma.  Invasive ductal carcinoma was extended to involve deep dermis of the nipple.  Lymphovascular invasion was identified.  Total of 4 sentinel lymph nodes were evaluated.  Touch imprints  at the time of the OR was felt to be negative.  Subsequent evaluation showed a 5 mm focus of metastatic carcinoma in one of the four lymph nodes on microscopic after sectioning.  There was extracapsular extension  of one lymph node as well. The remaining three lymph nodes were all negative."  The patient's subsequent history is detailed above.   PAST MEDICAL HISTORY: Past Medical History:  Diagnosis Date  . Arthritis    left hip  . Asthma   . breast ca 2005   breast/chemo R mastectomy  . Dermatitis   . Diabetes mellitus without complication ([place])   . GERD (gastroesophageal reflux disease)   . Hypercholesteremia   . Metastasis to lung ([place]) dx'd 08/2011  . Osteopenia due to cancer therapy 09/09/2013  . Palpitations   . Personal history of chemotherapy   . Shortness of breath     PAST SURGICAL HISTORY: Past Surgical History:  Procedure Laterality Date  . BREAST SURGERY  2005   right  . CATARACT EXTRACTION W/PHACO Left 01/18/2014   Procedure: CATARACT EXTRACTION PHACO AND INTRAOCULAR LENS PLACEMENT (IOC);  Surgeon: Elta Guadeloupe T. Gershon Crane, MD;  Location: [facility];  Service: Ophthalmology;  Laterality: Left;  CDE 15.79  . CATARACT EXTRACTION W/PHACO Right 02/08/2014   Procedure: CATARACT EXTRACTION PHACO AND INTRAOCULAR LENS PLACEMENT (IOC);  Surgeon: Elta Guadeloupe T. Gershon Crane, MD;  Location: [facility];  Service: Ophthalmology;  Laterality: Right;  CDE 4.41  . CHEST TUBE INSERTION  09/11/2011   Procedure: INSERTION PLEURAL DRAINAGE CATHETER;  Surgeon: Gaye Pollack, MD;  Location: [facility];  Service: Thoracic;  Laterality: Right;  . MASTECTOMY Right   . PERICARDIAL WINDOW  09/11/2011   Procedure: PERICARDIAL WINDOW;  Surgeon: Gaye Pollack, MD;  Location: [facility] OR;  Service: Thoracic;  Laterality: N/A;  . REMOVAL OF PLEURAL DRAINAGE CATHETER  12/19/2011   Procedure: REMOVAL OF PLEURAL DRAINAGE CATHETER;  Surgeon: Gaye Pollack, MD;  Location: [facility];  Service: Thoracic;  Laterality: Right;  TO BE DONE IN MINOR  ROOM, SHORT STAY  . [place]  . TOTAL HIP ARTHROPLASTY Left 06/07/2015   Procedure: LEFT TOTAL HIP ARTHROPLASTY ANTERIOR APPROACH;  Surgeon: Gaynelle Arabian, MD;  Location: [facility];  Service: Orthopedics;  Laterality: Left;  Marland Kitchen VIDEO BRONCHOSCOPY  09/11/2011   Procedure: VIDEO BRONCHOSCOPY;  Surgeon: Gaye Pollack, MD;  Location[facility] OR;  Service: Thoracic;  Laterality: N/A;    FAMILY HISTORY Family History  Problem Relation Age of Onset  . Cancer Mother        breast  . Heart disease Father   . Diabetes Other   . Anesthesia problems Neg Hx    The patient's mother was diagnosed with breast cancer at age 53, she died age 51 from congestive heart failure.The patient's father died from heart disease at age 79.  She has one sister alive & well.  Two brothers alive & well, one with diabetes. The patient's sister was also diagnosed with breast cancer, and was tested for the BRCA gene, and was negative. The patient herself has not been tested.  There is no history of ovarian cancer in the family   GYNECOLOGIC HISTORY:  No LMP recorded. Patient is postmenopausal. Menarche age 52, the patient is GX P0. She stopped having periods with chemotherapy in 2005. 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 She was a Archivist. She is single, lives alone with her Shitzu-poodle Sammie.. Family is all in the [place] area.     ADVANCED DIRECTIVES: Not in place. At the 02/23/2014 visit the patient was given the appropriate documents to complete and notarize at her discretion. She tells me she is planning to name her sister, Billie Ruddy, as healthcare power of attorney. [name] can be reached at [phone]   HEALTH MAINTENANCE: Social History   Tobacco Use  . Smoking status: Former Smoker    Packs/day: 1.50    Years: 30.00    Pack years: 45.00    Types: Cigarettes    Quit  date: 09/10/2007    Years since quitting: 12.4  . Smokeless tobacco: Never Used  Substance Use Topics  . Alcohol use: No  . Drug use: No    Colonoscopy:  PAP:  Bone density: 04/09/2018, T score -1.8  Lipid panel:  Allergies  Allergen Reactions  . Aspirin     REACTION: upset stomach  . Latex Other (See Comments)    Blistering and skin peels off  . Nsaids Nausea And Vomiting    Extreme nausea and vomiting    Current Outpatient Medications  Medication Sig Dispense Refill  . cholecalciferol (VITAMIN D3) 25 MCG (1000 UT) tablet Take 2 tablets (2,000 Units total) by mouth daily. 180 tablet 4  . cyclobenzaprine (FLEXERIL) 10 MG tablet Take 1 tablet (10 mg total) by mouth 2 (two) times daily as needed for muscle spasms. 20 tablet 0  . gemfibrozil (LOPID) 600 MG tablet TAKE 1 TABLET (600 MG TOTAL) BY MOUTH 2 (TWO) TIMES DAILY BEFORE A MEAL. 180 tablet 1  . loperamide (IMODIUM) 2 MG capsule Take 2 mg by mouth 4 (four) times daily as needed for diarrhea or loose stools. Reported on 11/30/2015    . metFORMIN (GLUCOPHAGE-XR) 500 MG 24 hr tablet Take 1 tablet (500 mg total) by mouth 3 (three) times daily before meals.  3  . palbociclib (IBRANCE) 125 MG tablet Take 1 tablet (125 mg total) by mouth daily. Take for 21 days on, 7 days off, repeat every 28 days. 21 tablet 6  . traMADol (ULTRAM) 50 MG tablet Take 0.5-1 tablets (25-50 mg total) by mouth every 6 (six) hours as needed. 30 tablet 0   No current facility-administered medications for this visit.    OBJECTIVE: White woman who appears stated age 69:   02/08/20 1400  BP: 107/63  Pulse: 95  Resp: 18  Temp: 98 F (36.7 C)  SpO2: 97%   Wt Readings from Last 3 Encounters:  02/08/20 149 lb 1.6 oz (67.6 kg)  01/11/20 146 lb 4.8 oz (66.4 kg)  12/14/19 145 lb 6.4 oz (66 kg)   Body mass index is 25.2 kg/m.    ECOG FS:1 - Symptomatic but completely ambulatory  Ocular: Sclerae unicteric, pupils round and equal Ear-nose-throat:  Wearing a mask Lymphatic: No cervical or supraclavicular adenopathy Lungs no rales or rhonchi Heart regular rate and rhythm Abd soft, nontender, positive bowel sounds MSK no focal spinal tenderness, no joint edema Neuro: non-focal, well-oriented, appropriate affect Breasts:  Deferred    LAB  RESULTS:  CMP     Component Value Date/Time   NA 139 02/08/2020 1337   NA 139 07/08/2017 0942   K 4.9 02/08/2020 1337   K 3.8 07/08/2017 0942   CL 107 02/08/2020 1337   CL 109 (H) 12/31/2012 0940   CO2 23 02/08/2020 1337   CO2 18 (L) 07/08/2017 0942   GLUCOSE 90 02/08/2020 1337   GLUCOSE 156 (H) 07/08/2017 0942   GLUCOSE 127 (H) 12/31/2012 0940   BUN 23 02/08/2020 1337   BUN 17.7 07/08/2017 0942   CREATININE 1.18 (H) 02/08/2020 1337   CREATININE 1.18 (H) 12/25/2017 0959   CREATININE 1.0 07/08/2017 0942   CALCIUM 10.1 02/08/2020 1337   CALCIUM 10.4 07/08/2017 0942   PROT 7.3 02/08/2020 1337   PROT 7.7 07/08/2017 0942   ALBUMIN 3.7 02/08/2020 1337   ALBUMIN 3.4 (L) 07/08/2017 0942   AST 15 02/08/2020 1337   AST 50 (H) 12/25/2017 0959   AST 54 (H) 07/08/2017 0942   ALT 16 02/08/2020 1337   ALT 53 12/25/2017 0959   ALT 39 07/08/2017 0942   ALKPHOS 74 02/08/2020 1337   ALKPHOS 232 (H) 07/08/2017 0942   BILITOT 0.3 02/08/2020 1337   BILITOT 0.3 12/25/2017 0959   BILITOT 0.42 07/08/2017 0942   GFRNONAA 47 (L) 02/08/2020 1337   GFRNONAA 47 (L) 12/25/2017 0959   GFRAA 54 (L) 02/08/2020 1337   GFRAA 54 (L) 12/25/2017 0959    I No results found for: SPEP  Lab Results  Component Value Date   WBC 5.0 02/08/2020   NEUTROABS 3.2 02/08/2020   HGB 13.7 02/08/2020   HCT 40.7 02/08/2020   MCV 106.0 (H) 02/08/2020   PLT 174 02/08/2020      Chemistry      Component Value Date/Time   NA 139 02/08/2020 1337   NA 139 07/08/2017 0942   K 4.9 02/08/2020 1337   K 3.8 07/08/2017 0942   CL 107 02/08/2020 1337   CL 109 (H) 12/31/2012 0940   CO2 23 02/08/2020 1337   CO2 18 (L)  07/08/2017 0942   BUN 23 02/08/2020 1337   BUN 17.7 07/08/2017 0942   CREATININE 1.18 (H) 02/08/2020 1337   CREATININE 1.18 (H) 12/25/2017 0959   CREATININE 1.0 07/08/2017 0942      Component Value Date/Time   CALCIUM 10.1 02/08/2020 1337   CALCIUM 10.4 07/08/2017 0942   ALKPHOS 74 02/08/2020 1337   ALKPHOS 232 (H) 07/08/2017 0942   AST 15 02/08/2020 1337   AST 50 (H) 12/25/2017 0959   AST 54 (H) 07/08/2017 0942   ALT 16 02/08/2020 1337   ALT 53 12/25/2017 0959   ALT 39 07/08/2017 0942   BILITOT 0.3 02/08/2020 1337   BILITOT 0.3 12/25/2017 0959   BILITOT 0.42 07/08/2017 0942       Lab Results  Component Value Date   LABCA2 58 (H) 09/14/2012    No components found for: [id number]  No results for input(s): INR in the last 168 hours.  Urinalysis    Component Value Date/Time   COLORURINE YELLOW 11/25/2017 0901   APPEARANCEUR HAZY (A) 11/25/2017 0901   LABSPEC 1.017 11/25/2017 0901   LABSPEC 1.030 07/13/2015 0841   PHURINE 5.0 11/25/2017 0901   GLUCOSEU NEGATIVE 11/25/2017 0901   GLUCOSEU Negative 07/13/2015 0841   HGBUR MODERATE (A) 11/25/2017 0901   BILIRUBINUR NEGATIVE 11/25/2017 0901   BILIRUBINUR Negative 07/13/2015 0841   KETONESUR NEGATIVE 11/25/2017 0901  PROTEINUR 100 (A) 11/25/2017 0901   UROBILINOGEN 0.2 07/13/2015 0841   NITRITE NEGATIVE 11/25/2017 0901   LEUKOCYTESUR NEGATIVE 11/25/2017 0901   LEUKOCYTESUR Negative 07/13/2015 0841    STUDIES: MR LIVER W WO CONTRAST  Result Date: 02/01/2020 CLINICAL DATA:  Breast cancer staging, follow-up assessment from previous MRI. EXAM: MRI ABDOMEN WITHOUT AND WITH CONTRAST TECHNIQUE: Multiplanar multisequence MR imaging of the abdomen was performed both before and after the administration of intravenous contrast. CONTRAST:  66m GADAVIST GADOBUTROL 1 MMOL/ML IV SOLN COMPARISON:  11/11/2019 FINDINGS: Lower chest: Small RIGHT pleural effusion. Volume is quite similar to the previous chest imaging study from April of  2021. Post RIGHT mastectomy. Heart size is normal without pericardial effusion. No new areas of consolidation. Volume loss at the RIGHT lung base grossly similar though assessment on MRI is limited. Hepatobiliary: Multiple hepatic lesions. Lesions best displayed on diffusion-weighted imaging. (Image 57, series 6) 14 mm lesion in the lateral segment of the LEFT hepatic lobe unchanged. (Image 57, series 6): 12 mm lesion in the posterior LEFT hepatic lobe, subsegment III unchanged. Small lesions in the posterior RIGHT hepatic lobe at the margin of the anterior LEFT liver and along the margin of the RIGHT hemi liver are similarly stable. Pancreas: Mild pancreatic ductal distension is unchanged. No peripancreatic stranding. Spleen:  Spleen normal in size and contour. Adrenals/Urinary Tract: Normal adrenal glands. Smooth renal contours. No hydronephrosis. No suspicious renal lesion. Stomach/Bowel: Small periampullary duodenal diverticulum. Moderate-sized duodenal diverticulum at the third-fourth portion of the duodenum. No acute gastrointestinal process to the extent evaluated on MRI. Vascular/Lymphatic: No adenopathy in the retroperitoneum. No aneurysmal dilation of the abdominal aorta. Other:  No ascites. Musculoskeletal: Area of sclerosis along the superior margin of L3 with similar appearance. Thoracic lesions again with similar appearance, potentially vertebral hemangioma and endplate degenerative changes. Rib irregularity and enhancement along the RIGHT posterior ninth rib is similar accounting for differences in technique, seen at the upper margin of submitted images. IMPRESSION: Signs of skeletal metastases and hepatic metastatic lesions are unchanged. Again follow-up with hepatocyte specific contrast agent may be helpful for measurement purposes and lesion tracking as warranted. Small RIGHT effusion.  Lung base assessment limited on MRI. Electronically Signed   By: GZetta BillsM.D.   On: 02/01/2020 18:40     ASSESSMENT: 69y.o. Barbara Parks, Barbara Parks woman with stage IV breast cancer, on BOLERO-4 trial  (1) status post right mastectomy and sentinel lymph node sampling 03/22/2004 for a right upper-outer quadrant pT2 pN1, stage IIB invasive ductal carcinoma with lobular features, grade 2, estrogen receptor and progesterone receptor positive, HER-2 negative, with an MIB-1 of 9% (([id number]  (2) addition all right axillary lymph node sampling 05/07/2004 showed 2 benign lymph nodes (4 lymph nodes previously removed, so total was one positive lymph node out of 6; [id number])  (3) the patient was evaluated by radiation oncology; no postmastectomy radiation recommended  (4) adjuvant chemotherapy with dose dense doxorubicin and cyclophosphamide x4 cycles (first cycle delayed one week) followed by dose dense paclitaxel x4 was completed 09/18/2004  (5) tamoxifen started March 2006, discontinued 2009  METASTATIC DISEASE: February 2013 (6) presenting with a large pericardial effusion, large right pleural effusion and possible right middle lobe bronchial obstruction February 2013, status post pericardial window placement, fiberoptic bronchoscopy and right Pleurx placement 09/11/2011, with biopsy of the bronchus intermedius and pericardium positive for metastatic breast cancer, estrogen receptor 91% positive with moderate staining intensity, progesterone receptor 100% positive with strong staining intensity, with  an MIB-1 of 35%, and no HER-2 amplification, the signals ratio being 1.37 ([id number])  (7) enrolled in BOLERO-4 trial 10/10/2011, receiving letrozole and everolimus  (a) two small areas of enhancement in the cerebellum noted by brain MRI 10/03/2011 were no longer apparent on repeat MRI 08/21/2012-- most recent brain MRI 04/01/2014 showed no evidence of intracranial metastatic disease  (b) sclerotic lesions in left iliac bone and sacrum have not been biopsied; stable; to start zolendronate after patient  updates her dental care (extraction planned)--never started  (c) RLL lung nodule, stable (rescanned 07/12/2014 and 08/11/2014) R hilar and subcarinal lymph nodes: stable  (d) CT of the chest:03/25/2018, stable  (e) CT Angio 12/12/2018 stable  (f) off BOLERO trial (trial ended) as of July 2020, but continuing on study drugs  (g) letrozole and everolimus discontinued January 2021 with disease progression  (8) additional problems:  (a) hepatic steatosis  (b) COPD/ emphysema/ asthma  (c) advanced L hip osteoarthritis, status post left total hip replacement 06/07/2015  (d) aortoiliac atherosclerosis  (e) dental evaluation pending w possible dental extractions  (f) likely thalassemia trait  (g) hyperlipidemia  (9) Bone density concerns: DEXA scan 08/11/2013 was normal  (a) repeat DEXA scan 04/09/2018 shows a T score of -1.8, osteopenia  (10) disease monitoring:  (a) PET scan 12/22/2018 shows no hypermetabolic soft tissue metastases but multifocal bony metastasis  (b) denosumab/Xgeva started on 02/25/2019, repeated every 28 days (delayed due to dental extraction healing issues)  (c) bone scan 05/13/2019 serves as baseline study with increased activity in the right scapular, lower ribs and right femoral head (stable compared to PET scan of May 2020  (d) bone scan 07/09/2019 stable  (e) CT chest 07/09/2019 shows stable lung lesions but emerging left liver lobe 0.7 cm lesion  (f) liver MRI 08/03/2019 shows approximately 8 liver lesions, the largest measuring 1.8 cm.  (g) liver biopsy 08/06/2019 confirms metastatic breast cancer, estrogen and progesterone receptor positive, with an MIB-1 of 5% and HER-2 not amplified  (h) chest CT and bone scan 11/11/2019 essentially stable  (i) liver MRI 02/01/2020 stable to improved  (11) denosumab/Xgeva begun 02/25/2019 repeated every 4 weeks  (12) fulvestrant started 08/24/2019  (a) palbociclib 125 mg daily 21 days on 7 days off started  08/25/2019   PLAN: Barbara Parks is now 8-1/2 years out from initial diagnosis of metastatic breast cancer.  Her disease is well controlled on her current treatment and she is tolerating it well.  I am delighted that she has gained some weight.  She has an excellent functional status, minimal pain problems, and no significant cytopenias so far from the palbociclib.  Accordingly we are continuing treatment as before.  She is now a year into the denosumab/Xgeva, and we will obtain a repeat bone scan in 2 months.  If that remains stable we will likely move that to every 2 months.  The Faslodex of course continues monthly.  She will have a repeat CT of the chest sometime before the end of the year  She knows to call for any other issue that may develop before the next visit  Total encounter time: 30 minutes.Lurline Del, MD 02/08/20 6:21 PM Medical Oncology and Hematology [facility] [place], [place] [address] Tel. [phone]    Fax. [phone]   I, Jacqualyn Posey am acting as a Education administrator for Chauncey Cruel, MD.   I, Lurline Del MD, have reviewed the above documentation for accuracy and completeness,  and I agree with the above.    *Total Encounter Time as defined by the Centers for Medicare and Medicaid Services includes, in addition to the face-to-face time of a patient visit (documented in the note above) non-face-to-face time: obtaining and reviewing outside history, ordering and reviewing medications, tests or procedures, care coordination (communications with other health care professionals or caregivers) and documentation in the medical record.

## 2020-02-08 ENCOUNTER — Other Ambulatory Visit: Payer: Self-pay

## 2020-02-08 ENCOUNTER — Inpatient Hospital Stay: Payer: Medicare Other

## 2020-02-08 ENCOUNTER — Inpatient Hospital Stay: Payer: Medicare Other | Attending: Oncology

## 2020-02-08 ENCOUNTER — Inpatient Hospital Stay (HOSPITAL_BASED_OUTPATIENT_CLINIC_OR_DEPARTMENT_OTHER): Payer: Medicare Other | Admitting: Oncology

## 2020-02-08 VITALS — BP 107/63 | HR 95 | Temp 98.0°F | Resp 18 | Ht 64.5 in | Wt 149.1 lb

## 2020-02-08 DIAGNOSIS — C50911 Malignant neoplasm of unspecified site of right female breast: Secondary | ICD-10-CM

## 2020-02-08 DIAGNOSIS — C7951 Secondary malignant neoplasm of bone: Secondary | ICD-10-CM

## 2020-02-08 DIAGNOSIS — C787 Secondary malignant neoplasm of liver and intrahepatic bile duct: Secondary | ICD-10-CM

## 2020-02-08 DIAGNOSIS — C50411 Malignant neoplasm of upper-outer quadrant of right female breast: Secondary | ICD-10-CM | POA: Diagnosis not present

## 2020-02-08 DIAGNOSIS — Z9011 Acquired absence of right breast and nipple: Secondary | ICD-10-CM

## 2020-02-08 DIAGNOSIS — C7801 Secondary malignant neoplasm of right lung: Secondary | ICD-10-CM

## 2020-02-08 DIAGNOSIS — Z5111 Encounter for antineoplastic chemotherapy: Secondary | ICD-10-CM | POA: Diagnosis not present

## 2020-02-08 DIAGNOSIS — Z79899 Other long term (current) drug therapy: Secondary | ICD-10-CM

## 2020-02-08 DIAGNOSIS — C7800 Secondary malignant neoplasm of unspecified lung: Secondary | ICD-10-CM

## 2020-02-08 DIAGNOSIS — I3131 Malignant pericardial effusion in diseases classified elsewhere: Secondary | ICD-10-CM

## 2020-02-08 DIAGNOSIS — Z170 Estrogen receptor positive status [ER+]: Secondary | ICD-10-CM

## 2020-02-08 DIAGNOSIS — Z803 Family history of malignant neoplasm of breast: Secondary | ICD-10-CM

## 2020-02-08 DIAGNOSIS — Z87891 Personal history of nicotine dependence: Secondary | ICD-10-CM

## 2020-02-08 DIAGNOSIS — Z96642 Presence of left artificial hip joint: Secondary | ICD-10-CM

## 2020-02-08 LAB — CBC WITH DIFFERENTIAL/PLATELET
Abs Immature Granulocytes: 0.02 10*3/uL (ref 0.00–0.07)
Basophils Absolute: 0.1 10*3/uL (ref 0.0–0.1)
Basophils Relative: 2 %
Eosinophils Absolute: 0 10*3/uL (ref 0.0–0.5)
Eosinophils Relative: 1 %
HCT: 40.7 % (ref 36.0–46.0)
Hemoglobin: 13.7 g/dL (ref 12.0–15.0)
Immature Granulocytes: 0 %
Lymphocytes Relative: 20 %
Lymphs Abs: 1 10*3/uL (ref 0.7–4.0)
MCH: 35.7 pg — ABNORMAL HIGH (ref 26.0–34.0)
MCHC: 33.7 g/dL (ref 30.0–36.0)
MCV: 106 fL — ABNORMAL HIGH (ref 80.0–100.0)
Monocytes Absolute: 0.7 10*3/uL (ref 0.1–1.0)
Monocytes Relative: 14 %
Neutro Abs: 3.2 10*3/uL (ref 1.7–7.7)
Neutrophils Relative %: 63 %
Platelets: 174 10*3/uL (ref 150–400)
RBC: 3.84 MIL/uL — ABNORMAL LOW (ref 3.87–5.11)
RDW: 13.9 % (ref 11.5–15.5)
WBC: 5 10*3/uL (ref 4.0–10.5)
nRBC: 0 % (ref 0.0–0.2)

## 2020-02-08 LAB — COMPREHENSIVE METABOLIC PANEL
ALT: 16 U/L (ref 0–44)
AST: 15 U/L (ref 15–41)
Albumin: 3.7 g/dL (ref 3.5–5.0)
Alkaline Phosphatase: 74 U/L (ref 38–126)
Anion gap: 9 (ref 5–15)
BUN: 23 mg/dL (ref 8–23)
CO2: 23 mmol/L (ref 22–32)
Calcium: 10.1 mg/dL (ref 8.9–10.3)
Chloride: 107 mmol/L (ref 98–111)
Creatinine, Ser: 1.18 mg/dL — ABNORMAL HIGH (ref 0.44–1.00)
GFR calc Af Amer: 54 mL/min — ABNORMAL LOW (ref >60)
GFR calc non Af Amer: 47 mL/min — ABNORMAL LOW (ref >60)
Glucose, Bld: 90 mg/dL (ref 70–99)
Potassium: 4.9 mmol/L (ref 3.5–5.1)
Sodium: 139 mmol/L (ref 135–145)
Total Bilirubin: 0.3 mg/dL (ref 0.3–1.2)
Total Protein: 7.3 g/dL (ref 6.5–8.1)

## 2020-02-08 MED ORDER — DENOSUMAB 120 MG/1.7ML ~~LOC~~ SOLN
120.0000 mg | Freq: Once | SUBCUTANEOUS | Status: AC
  Administered 2020-02-08: 120 mg via SUBCUTANEOUS

## 2020-02-08 MED ORDER — FULVESTRANT 250 MG/5ML IM SOLN
500.0000 mg | Freq: Once | INTRAMUSCULAR | Status: AC
  Administered 2020-02-08: 500 mg via INTRAMUSCULAR

## 2020-02-08 MED ORDER — FULVESTRANT 250 MG/5ML IM SOLN
INTRAMUSCULAR | Status: AC
  Filled 2020-02-08: qty 5

## 2020-02-08 MED ORDER — DENOSUMAB 120 MG/1.7ML ~~LOC~~ SOLN
SUBCUTANEOUS | Status: AC
  Filled 2020-02-08: qty 1.7

## 2020-02-09 ENCOUNTER — Telehealth: Payer: Self-pay | Admitting: Oncology

## 2020-02-09 LAB — CANCER ANTIGEN 27.29: CA 27.29: 265 U/mL — ABNORMAL HIGH (ref 0.0–38.6)

## 2020-02-09 NOTE — Telephone Encounter (Signed)
Scheduled appt per 7/13 sch msg - mailed reminder letter with appt date and time

## 2020-02-10 ENCOUNTER — Telehealth: Payer: Self-pay | Admitting: Oncology

## 2020-02-10 NOTE — Telephone Encounter (Signed)
Scheduled appts per 7/13 los. Left voicemail with appt date and time.

## 2020-03-07 ENCOUNTER — Other Ambulatory Visit: Payer: Self-pay

## 2020-03-07 ENCOUNTER — Inpatient Hospital Stay: Payer: Medicare Other

## 2020-03-07 ENCOUNTER — Inpatient Hospital Stay: Payer: Medicare Other | Attending: Oncology

## 2020-03-07 VITALS — BP 133/62 | HR 96 | Temp 97.6°F | Resp 18

## 2020-03-07 DIAGNOSIS — C7951 Secondary malignant neoplasm of bone: Secondary | ICD-10-CM | POA: Insufficient documentation

## 2020-03-07 DIAGNOSIS — C7800 Secondary malignant neoplasm of unspecified lung: Secondary | ICD-10-CM

## 2020-03-07 DIAGNOSIS — C50411 Malignant neoplasm of upper-outer quadrant of right female breast: Secondary | ICD-10-CM | POA: Insufficient documentation

## 2020-03-07 DIAGNOSIS — I3131 Malignant pericardial effusion in diseases classified elsewhere: Secondary | ICD-10-CM

## 2020-03-07 DIAGNOSIS — Z5111 Encounter for antineoplastic chemotherapy: Secondary | ICD-10-CM | POA: Insufficient documentation

## 2020-03-07 DIAGNOSIS — C50911 Malignant neoplasm of unspecified site of right female breast: Secondary | ICD-10-CM

## 2020-03-07 DIAGNOSIS — C787 Secondary malignant neoplasm of liver and intrahepatic bile duct: Secondary | ICD-10-CM

## 2020-03-07 LAB — CBC WITH DIFFERENTIAL/PLATELET
Abs Immature Granulocytes: 0.05 10*3/uL (ref 0.00–0.07)
Basophils Absolute: 0.1 10*3/uL (ref 0.0–0.1)
Basophils Relative: 2 %
Eosinophils Absolute: 0 10*3/uL (ref 0.0–0.5)
Eosinophils Relative: 1 %
HCT: 40.3 % (ref 36.0–46.0)
Hemoglobin: 13.5 g/dL (ref 12.0–15.0)
Immature Granulocytes: 1 %
Lymphocytes Relative: 20 %
Lymphs Abs: 1.1 10*3/uL (ref 0.7–4.0)
MCH: 35.1 pg — ABNORMAL HIGH (ref 26.0–34.0)
MCHC: 33.5 g/dL (ref 30.0–36.0)
MCV: 104.7 fL — ABNORMAL HIGH (ref 80.0–100.0)
Monocytes Absolute: 0.5 10*3/uL (ref 0.1–1.0)
Monocytes Relative: 10 %
Neutro Abs: 3.6 10*3/uL (ref 1.7–7.7)
Neutrophils Relative %: 66 %
Platelets: 141 10*3/uL — ABNORMAL LOW (ref 150–400)
RBC: 3.85 MIL/uL — ABNORMAL LOW (ref 3.87–5.11)
RDW: 13.3 % (ref 11.5–15.5)
WBC: 5.3 10*3/uL (ref 4.0–10.5)
nRBC: 0 % (ref 0.0–0.2)

## 2020-03-07 LAB — COMPREHENSIVE METABOLIC PANEL
ALT: 13 U/L (ref 0–44)
AST: 16 U/L (ref 15–41)
Albumin: 3.7 g/dL (ref 3.5–5.0)
Alkaline Phosphatase: 79 U/L (ref 38–126)
Anion gap: 10 (ref 5–15)
BUN: 28 mg/dL — ABNORMAL HIGH (ref 8–23)
CO2: 22 mmol/L (ref 22–32)
Calcium: 10.3 mg/dL (ref 8.9–10.3)
Chloride: 108 mmol/L (ref 98–111)
Creatinine, Ser: 1.37 mg/dL — ABNORMAL HIGH (ref 0.44–1.00)
GFR calc Af Amer: 45 mL/min — ABNORMAL LOW (ref >60)
GFR calc non Af Amer: 39 mL/min — ABNORMAL LOW (ref >60)
Glucose, Bld: 92 mg/dL (ref 70–99)
Potassium: 4.7 mmol/L (ref 3.5–5.1)
Sodium: 140 mmol/L (ref 135–145)
Total Bilirubin: 0.3 mg/dL (ref 0.3–1.2)
Total Protein: 7.3 g/dL (ref 6.5–8.1)

## 2020-03-07 MED ORDER — DENOSUMAB 120 MG/1.7ML ~~LOC~~ SOLN
120.0000 mg | Freq: Once | SUBCUTANEOUS | Status: AC
  Administered 2020-03-07: 120 mg via SUBCUTANEOUS

## 2020-03-07 MED ORDER — FULVESTRANT 250 MG/5ML IM SOLN
500.0000 mg | Freq: Once | INTRAMUSCULAR | Status: AC
  Administered 2020-03-07: 500 mg via INTRAMUSCULAR

## 2020-03-07 MED ORDER — DENOSUMAB 120 MG/1.7ML ~~LOC~~ SOLN
SUBCUTANEOUS | Status: AC
  Filled 2020-03-07: qty 1.7

## 2020-03-07 MED ORDER — FULVESTRANT 250 MG/5ML IM SOLN
INTRAMUSCULAR | Status: AC
  Filled 2020-03-07: qty 10

## 2020-03-08 LAB — CANCER ANTIGEN 27.29: CA 27.29: 291.2 U/mL — ABNORMAL HIGH (ref 0.0–38.6)

## 2020-03-13 NOTE — Progress Notes (Signed)
This encounter was created in error - please disregard.

## 2020-03-14 IMAGING — CT CT CHEST W/ CM
2 of 4 series · 15 of 36 positions shown, 18 images · IV contrast (OMNIPAQUE)
Comparison: Chest CT 03/17/2018 and multiple other priors.

CLINICAL DATA: 67-year-old female with history of right-sided
breast cancer diagnosed in 7237. Status post right mastectomy and
lymph node dissection. Oral chemotherapy in progress.

EXAM:
CT CHEST WITH CONTRAST
TECHNIQUE: Multidetector CT imaging of the chest was performed during
intravenous contrast administration.
CONTRAST:  75mL OMNIPAQUE IOHEXOL 300 MG/ML  SOLN

[Series 2: axial st · axial · 0.69mm/px · z∈[-292,-10]mm · 12 of 167 slices shown, 15 images]
[im 13/167  mediastinal]
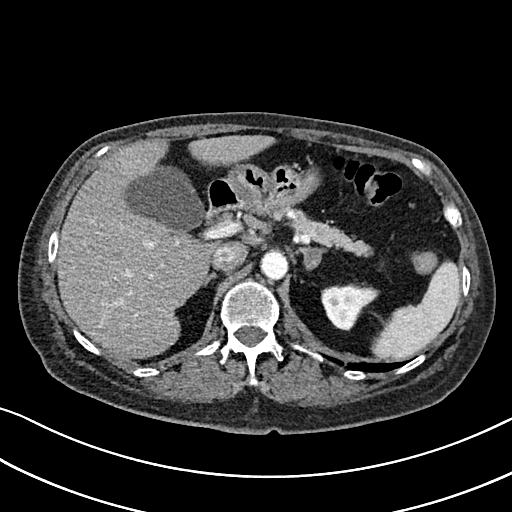
[im 13/167  lung]
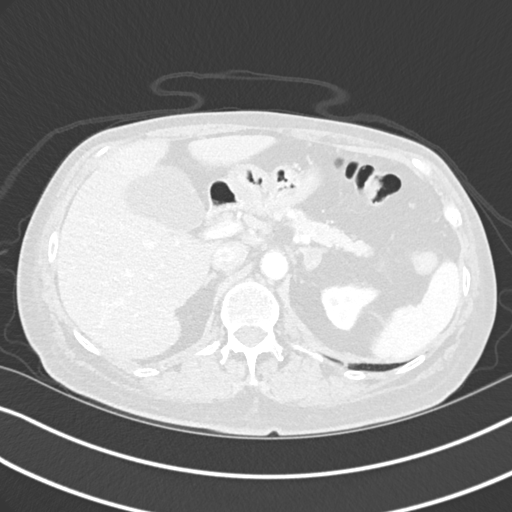
[im 26/167  lung]
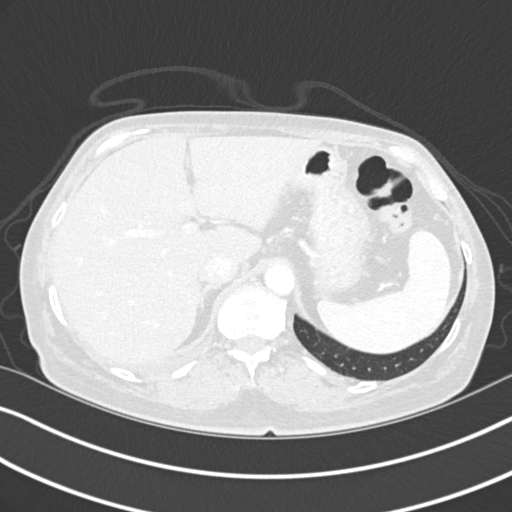
[im 39/167  lung]
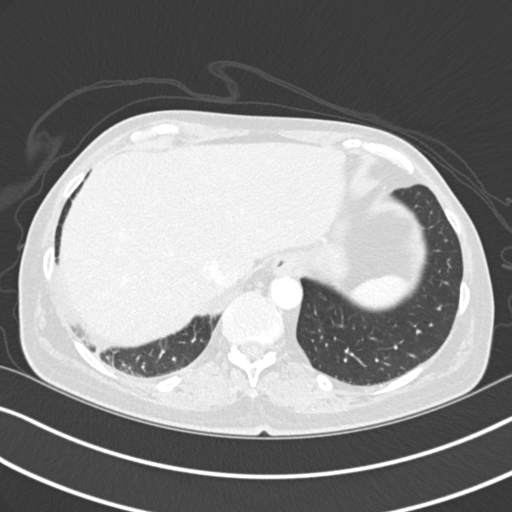
[im 52/167  lung]
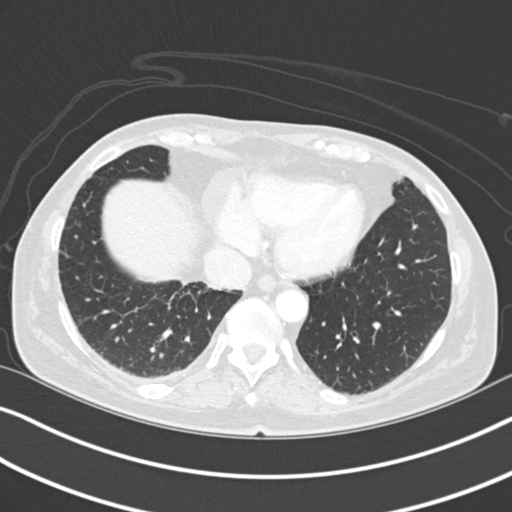
[im 64/167  mediastinal]
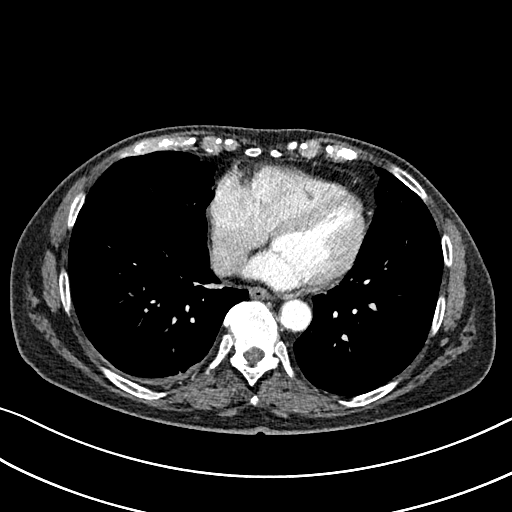
[im 64/167  lung]
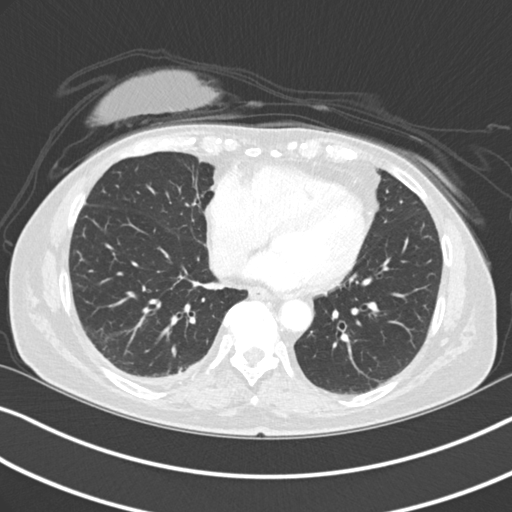
[im 77/167  lung]
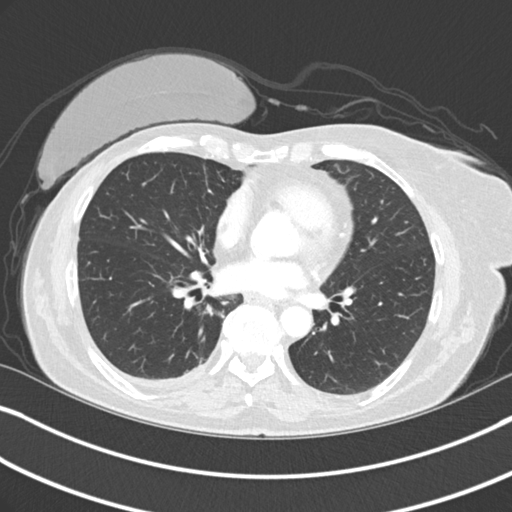
[im 90/167  lung]
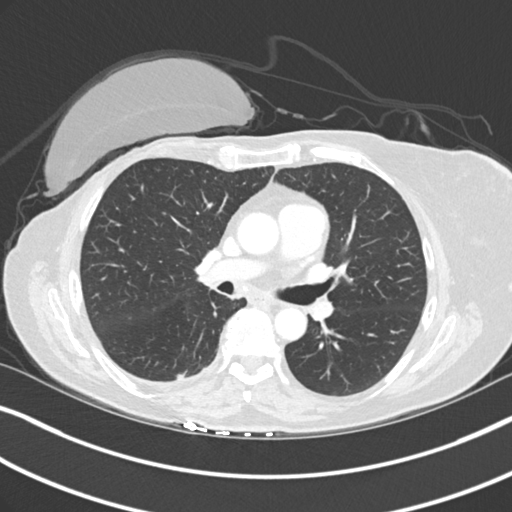
[im 103/167  lung]
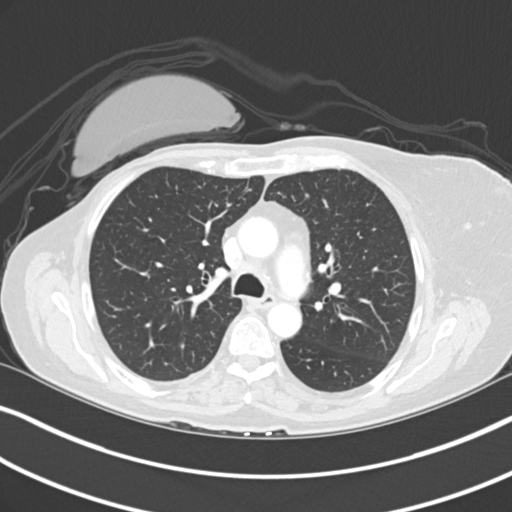
[im 115/167  mediastinal]
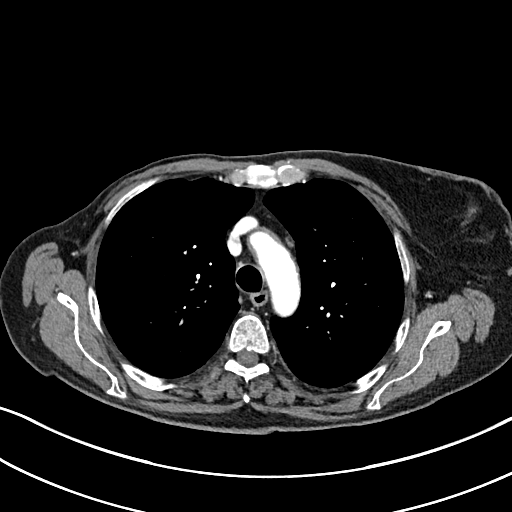
[im 115/167  lung]
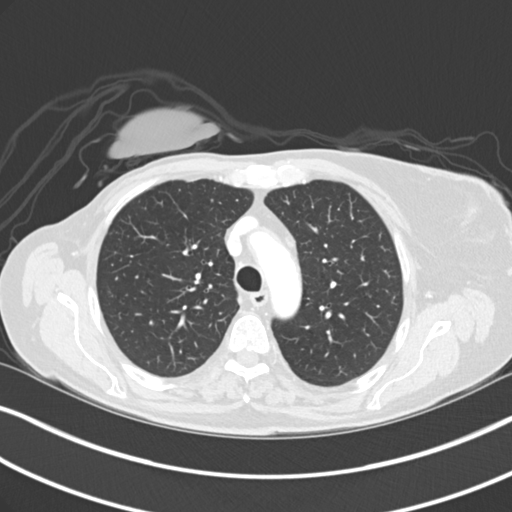
[im 128/167  lung]
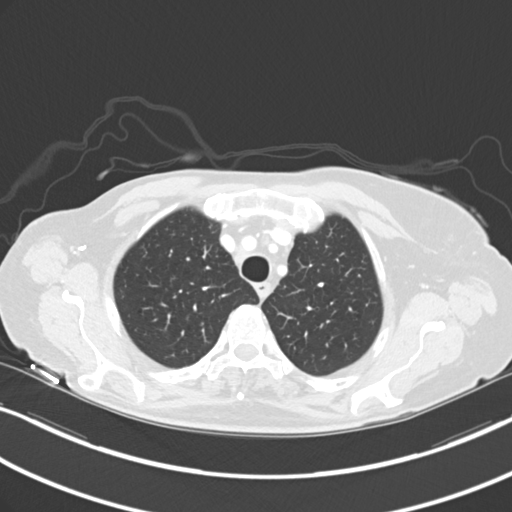
[im 141/167  lung]
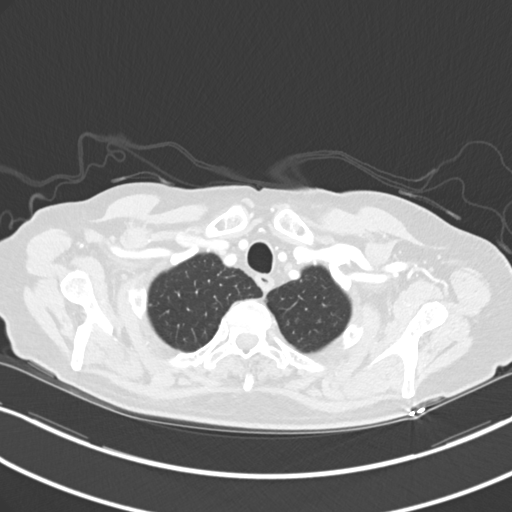
[im 154/167  lung]
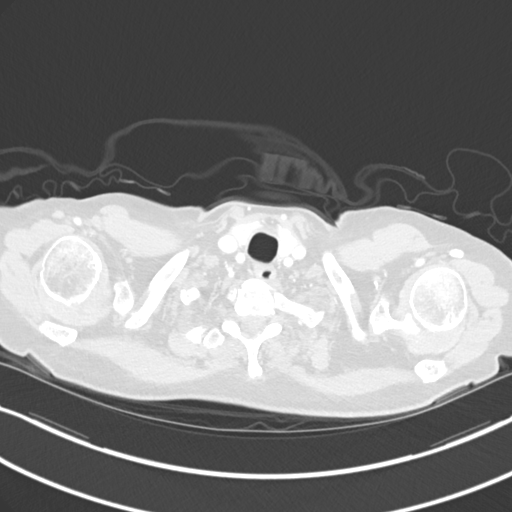

[Series 5: coronal · coronal · 0.64mm/px · 3 of 121 slices shown]
[im 25/121  lung]
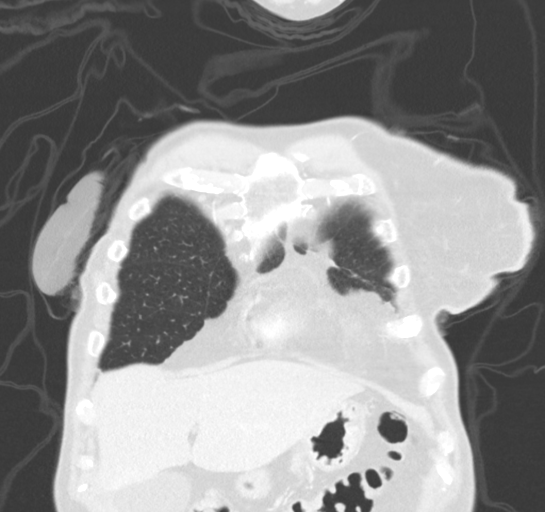
[im 49/121  lung]
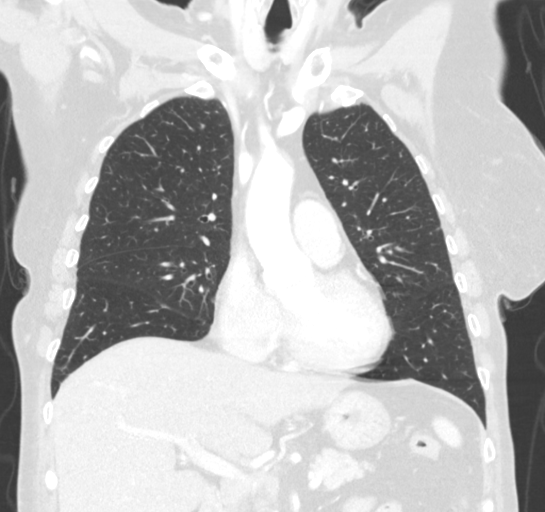
[im 73/121  lung]
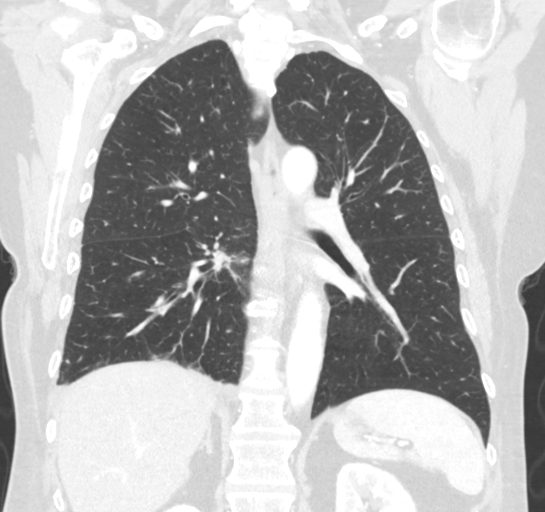

[15 of 36 positions shown; findings below may reference images not displayed]

FINDINGS: RECIST

Target Lesions:

1. 5 mm right lower lobe pulmonary nodule (axial image 101 of series
7), stable.
2. Right lower lobe pulmonary nodule (axial image 134 of series 7)
increased in size, currently measuring 1.0 x 1.8 cm (previously
x 1.3 cm).
3. 7 mm short axis subcarinal lymph node (axial image 78 of series
2), stable.

Non-target Lesions:

1. Mild right-sided chronic pleural thickening, stable.

Cardiovascular: Heart size is normal. There is no significant
pericardial fluid, thickening or pericardial calcification. Aortic
atherosclerosis. No definite coronary artery calcifications.

Mediastinum/Nodes: No pathologically enlarged mediastinal or hilar
lymph nodes. Esophagus is unremarkable in appearance. No axillary
lymphadenopathy. Surgical clips in the right axilla from prior lymph
node dissection.

Lungs/Pleura: Enlarging right lower lobe pulmonary nodule (axial
image 134 of series 7) measuring 1.0 x 1.8 cm (previously 0.7 x
cm). 5 mm right lower lobe pulmonary nodule (axial image 101 of
series 7), stable. New other a few other scattered 2-4 mm pulmonary
nodules are also noted throughout the lungs bilaterally, stable in
size, number and distribution to the prior study. No acute
consolidative airspace disease. Chronic right-sided pleural
thickening and trace right pleural fluid in the base of the right
hemithorax, stable. No left-sided pleural effusion.

Upper Abdomen: 1.4 cm left adrenal nodule, stable compared to prior
examinations, not characterized on today's non-contrast CT
examination, but likely to represent an adrenal adenoma.

Musculoskeletal: Status post right-sided modified radical mastectomy
and right axillary lymph node dissection. There are no aggressive
appearing lytic or blastic lesions noted in the visualized portions
of the skeleton.
IMPRESSION: 1. Enlarging right lower lobe pulmonary nodule which currently
measures 1.0 x 1.8 cm (previously 0.7 x 1.3 cm).
2. Other previously noted pulmonary nodules and additional findings
are stable compared to the prior examination.
3. Aortic atherosclerosis.
4. Stable 1.4 cm left adrenal nodule, not characterized on today's
examination, but statistically likely an adrenal adenoma.

Aortic Atherosclerosis ([id number]).

## 2020-03-29 ENCOUNTER — Other Ambulatory Visit: Payer: Self-pay | Admitting: Oncology

## 2020-03-29 DIAGNOSIS — Z1231 Encounter for screening mammogram for malignant neoplasm of breast: Secondary | ICD-10-CM

## 2020-04-04 NOTE — Progress Notes (Signed)
[facility]   Telephone:(336) [phone] [phone]) [phone]   Clinic Follow up Note   Patient Care Team: [name], MD as PCP - General (Family Medicine) Jerline Pain, MD (Cardiology) Gaye Pollack, MD (Cardiothoracic Surgery) Magrinat, Virgie Dad, MD as Consulting Physician (Oncology) 04/05/2020  CHIEF COMPLAINT: Follow-up metastatic breast cancer  CURRENT THERAPY: Fulvestrant, palbociclib, denosumab/Xgeva  INTERVAL HISTORY: Barbara Parks returns for follow-up as scheduled. She is on her off week from [place]. She gets very tired with exertion. Appetite is lower in general on Ibrance. She had a bad GERD episode with vomiting that resolved after about 4 days on prilosec. No other n/v/c/d. This month she has increased thoracic, right hip, and sacral pain. She had this pain before but essentially resolved on Xgeva, now it is back. Pain in her spine/between shoulders is 3-4 out of 10 but fluctuates constantly. This limits her physical activity. She is also experiencing left leg cramps and numbness in the left toes.   Otherwise, no fever, chills, cough, chest pain, dyspnea, leg edema, rash, mucositis.   MEDICAL HISTORY:  Past Medical History:  Diagnosis Date  . Arthritis    left hip  . Asthma   . breast ca 2005   breast/chemo R mastectomy  . Dermatitis   . Diabetes mellitus without complication ([place])   . GERD (gastroesophageal reflux disease)   . Hypercholesteremia   . Metastasis to lung ([place]) dx'd 08/2011  . Osteopenia due to cancer therapy 09/09/2013  . Palpitations   . Personal history of chemotherapy   . Shortness of breath     SURGICAL HISTORY: Past Surgical History:  Procedure Laterality Date  . BREAST SURGERY  2005   right  . CATARACT EXTRACTION W/PHACO Left 01/18/2014   Procedure: CATARACT EXTRACTION PHACO AND INTRAOCULAR LENS PLACEMENT (IOC);  Surgeon: Elta Guadeloupe T. Gershon Crane, MD;  Location: [facility];  Service: Ophthalmology;  Laterality: Left;  CDE 15.79  .  CATARACT EXTRACTION W/PHACO Right 02/08/2014   Procedure: CATARACT EXTRACTION PHACO AND INTRAOCULAR LENS PLACEMENT (IOC);  Surgeon: Elta Guadeloupe T. Gershon Crane, MD;  Location: [facility];  Service: Ophthalmology;  Laterality: Right;  CDE 4.41  . CHEST TUBE INSERTION  09/11/2011   Procedure: INSERTION PLEURAL DRAINAGE CATHETER;  Surgeon: Gaye Pollack, MD;  Location: [facility];  Service: Thoracic;  Laterality: Right;  . MASTECTOMY Right   . PERICARDIAL WINDOW  09/11/2011   Procedure: PERICARDIAL WINDOW;  Surgeon: Gaye Pollack, MD;  Location: [facility];  Service: Thoracic;  Laterality: N/A;  . REMOVAL OF PLEURAL DRAINAGE CATHETER  12/19/2011   Procedure: REMOVAL OF PLEURAL DRAINAGE CATHETER;  Surgeon: Gaye Pollack, MD;  Location: [facility];  Service: Thoracic;  Laterality: Right;  TO BE DONE IN MINOR ROOM, SHORT STAY  . [place]  . TOTAL HIP ARTHROPLASTY Left 06/07/2015   Procedure: LEFT TOTAL HIP ARTHROPLASTY ANTERIOR APPROACH;  Surgeon: Gaynelle Arabian, MD;  Location: [facility];  Service: Orthopedics;  Laterality: Left;  Marland Kitchen VIDEO BRONCHOSCOPY  09/11/2011   Procedure: VIDEO BRONCHOSCOPY;  Surgeon: Gaye Pollack, MD;  Location: [facility] OR;  Service: Thoracic;  Laterality: N/A;    I have reviewed the social history and family history with the patient and they are unchanged from previous note.  ALLERGIES:  is allergic to aspirin, latex, and nsaids.  MEDICATIONS:  Current Outpatient Medications  Medication Sig Dispense Refill  . cholecalciferol (VITAMIN D3) 25 MCG (1000 UT) tablet Take 2 tablets (2,000 Units total) by mouth daily. 180 tablet  4  . cyclobenzaprine (FLEXERIL) 10 MG tablet Take 1 tablet (10 mg total) by mouth 2 (two) times daily as needed for muscle spasms. 20 tablet 0  . gemfibrozil (LOPID) 600 MG tablet TAKE 1 TABLET (600 MG TOTAL) BY MOUTH 2 (TWO) TIMES DAILY BEFORE A MEAL. 180 tablet 1  . loperamide (IMODIUM) 2 MG capsule Take 2 mg by mouth 4 (four) times daily as needed for diarrhea or loose stools.  Reported on 11/30/2015    . metFORMIN (GLUCOPHAGE-XR) 500 MG 24 hr tablet Take 1 tablet (500 mg total) by mouth 3 (three) times daily before meals.  3  . palbociclib (IBRANCE) 125 MG tablet Take 1 tablet (125 mg total) by mouth daily. Take for 21 days on, 7 days off, repeat every 28 days. (Patient not taking: Reported on 04/05/2020) 21 tablet 6  . traMADol (ULTRAM) 50 MG tablet Take 0.5-1 tablets (25-50 mg total) by mouth every 6 (six) hours as needed. (Patient not taking: Reported on 04/05/2020) 30 tablet 0   No current facility-administered medications for this visit.    PHYSICAL EXAMINATION: ECOG PERFORMANCE STATUS: 1 - Symptomatic but completely ambulatory  Vitals:   04/05/20 1535  BP: 117/68  Pulse: 91  Resp: 18  Temp: 97.8 F (36.6 C)  SpO2: 98%   Filed Weights   04/05/20 1535  Weight: 153 lb 9.6 oz (69.7 kg)    GENERAL:alert, no distress and comfortable SKIN: no rash to exposed skin  EYES:  sclera clear NECK: without mass  LUNGS: wheezing, normal breathing effort  HEART: regular rate & rhythm, no lower extremity edema or calf tenderness  ABDOMEN: abdomen soft, non-tender and normal bowel sounds Musculoskeletal: focal tenderness in the upper/mid thoracic spine  NEURO: alert & oriented x 3 with fluent speech, normal gait Breast exam deferred   LABORATORY DATA:  I have reviewed the data as listed CBC Latest Ref Rng & Units 04/05/2020 03/07/2020 02/08/2020  WBC 4.0 - 10.5 K/uL 4.4 5.3 5.0  Hemoglobin 12.0 - 15.0 g/dL 13.2 13.5 13.7  Hematocrit 36 - 46 % 39.5 40.3 40.7  Platelets 150 - 400 K/uL 155 141(L) 174     CMP Latest Ref Rng & Units 04/05/2020 03/07/2020 02/08/2020  Glucose 70 - 99 mg/dL 102(H) 92 90  BUN 8 - 23 mg/dL 30(H) 28(H) 23  Creatinine 0.44 - 1.00 mg/dL 1.28(H) 1.37(H) 1.18(H)  Sodium 135 - 145 mmol/L 138 140 139  Potassium 3.5 - 5.1 mmol/L 4.6 4.7 4.9  Chloride 98 - 111 mmol/L 107 108 107  CO2 22 - 32 mmol/L 24 22 23   Calcium 8.9 - 10.3 mg/dL 9.8 10.3 10.1    Total Protein 6.5 - 8.1 g/dL 7.2 7.3 7.3  Total Bilirubin 0.3 - 1.2 mg/dL 0.3 0.3 0.3  Alkaline Phos 38 - 126 U/L 81 79 74  AST 15 - 41 U/L 13(L) 16 15  ALT 0 - 44 U/L 10 13 16       RADIOGRAPHIC STUDIES: I have personally reviewed the radiological images as listed and agreed with the findings in the report. No results found.   ASSESSMENT & PLAN: 69 y.o. female   1.  Malignant neoplasm of the upper outer quadrant of right breast, grade 2, ER/PR +, HER-2 -, MIB-1 9% -S/p right mastectomy and SLN sampling 03/22/2004, pT2pN1 stage IIb invasive ductal carcinoma with lobular features.  Additional right axillary lymph node sampling 05/07/2004 showed 2 benign lymph nodes, total of 1/6 + LNs -Post mastectomy radiation was recommended -S/p adjuvant  ddAC x4 followed by Taxol x4 completed 09/18/2004 -S/p adjuvant tamoxifen from 09/2004-2009 -She developed metastatic disease in 08/2011 presenting with a large pericardial effusion s/p pericardial window placement, bronchoscopy and right Pleurx placement 08/2011 biopsy confirmed metastatic breast cancer ER/PR positive, HER-2 negative, MIB-1 35% -Enrolled in BOLERO-4 trial on 10/10/2011 with letrozole and everolimus, restaging scans at various intervals showed lesions in the cerebellum, left iliac and sacrum, and right lower lobe. Trial ended 01/2019 but she continued study drugs until 07/2019 with disease progression -Denosumab/Xgeva started 01/2019 -Liver biopsy 08/06/2019 confirms metastatic breast cancer, ER/PR positive, HER-2 negative with an MIB-1 of 5% and HER-2 not amplified -Began fulvestrant and palbociclib in 07/2019 -CT/bone scan shows stable scattered metastatic lesions in the thorax, left sacrum, and right femoral head, no new lesions  2.  COPD/emphysema/asthma 3.  Left hip OA s/p total hip replacement 05/2015 4. ?  Thalassemia 5.  Hyperlipidemia and aortic atherosclerosis 6.  Osteopenia, DEXA 03/2018 shows T score of -1.8  Disposition:   Barbara Parks appears stable. She completed another cycle of Ibrance and continues monthly fulvestrant and Xgeva. She did not tolerate the last cycle as well, with more fatigue, low appetite, and GERD/n/v x1, but has been able to recover and function well overall.   She has increased pain in the thoracic spine, right hip, and sacrum (sites of known bone mets) with new left leg cramps and distal neuropathy. We reviewed pain management.  She does have left hip OA s/p hip replacement which may be contributing.  Her worsening pain and left lower extremity symptoms are overall worrisome for disease progression of her bone metastasis. I recommend to move her CT/bone scan sooner this month rather than wait until 10/27 as previously scheduled. I will discuss with Dr. Jana Hakim when he returns to the office.   She has mild wheezing on exam, will obtain chest xray.   We reviewed the CBC and CMP. CA 27.29 has been stable, the level is pending from today. If her chest xray is negative for infection she will begin next cycle Ibrance on 04/10/20.  She will proceed with fulvestrant and Xgeva today as planned.  F/u pending CT/bone scan or 10/6 with next injection if not done before then.     Orders Placed This Encounter  Procedures  . DG Chest 2 View    Standing Status:   Future    Number of Occurrences:   1    Standing Expiration Date:   04/05/2021    Order Specific Question:   Reason for Exam (SYMPTOM  OR DIAGNOSIS REQUIRED)    Answer:   wheezing, metastatic breast cancer with spinal mets    Order Specific Question:   Preferred imaging location?    Answer:   [facility]    Order Specific Question:   Radiology Contrast Protocol - do NOT remove file path    Answer:   [email or web address]   All questions were answered. The patient knows to call the clinic with any problems, questions or concerns. No barriers to learning were detected. Total encounter time  was 30 minutes.     Alla Feeling, NP 04/05/20

## 2020-04-05 ENCOUNTER — Encounter: Payer: Self-pay | Admitting: Nurse Practitioner

## 2020-04-05 ENCOUNTER — Ambulatory Visit (HOSPITAL_COMMUNITY)
Admission: RE | Admit: 2020-04-05 | Discharge: 2020-04-05 | Disposition: A | Discharge status: Home / Self Care | Payer: Medicare Other | Source: Ambulatory Visit | Attending: Nurse Practitioner | Admitting: Nurse Practitioner

## 2020-04-05 ENCOUNTER — Other Ambulatory Visit: Payer: Self-pay

## 2020-04-05 ENCOUNTER — Ambulatory Visit: Payer: Medicare Other

## 2020-04-05 ENCOUNTER — Inpatient Hospital Stay (HOSPITAL_BASED_OUTPATIENT_CLINIC_OR_DEPARTMENT_OTHER): Payer: Medicare Other | Admitting: Nurse Practitioner

## 2020-04-05 ENCOUNTER — Inpatient Hospital Stay: Payer: Medicare Other | Attending: Oncology

## 2020-04-05 ENCOUNTER — Ambulatory Visit: Payer: Medicare Other | Admitting: Adult Health

## 2020-04-05 ENCOUNTER — Other Ambulatory Visit: Payer: Medicare Other

## 2020-04-05 ENCOUNTER — Inpatient Hospital Stay: Payer: Medicare Other

## 2020-04-05 VITALS — BP 117/68 | HR 91 | Temp 97.8°F | Resp 18 | Ht 64.5 in | Wt 153.6 lb

## 2020-04-05 DIAGNOSIS — Z5111 Encounter for antineoplastic chemotherapy: Secondary | ICD-10-CM | POA: Insufficient documentation

## 2020-04-05 DIAGNOSIS — I3131 Malignant pericardial effusion in diseases classified elsewhere: Secondary | ICD-10-CM

## 2020-04-05 DIAGNOSIS — C7951 Secondary malignant neoplasm of bone: Secondary | ICD-10-CM | POA: Diagnosis not present

## 2020-04-05 DIAGNOSIS — R062 Wheezing: Secondary | ICD-10-CM | POA: Diagnosis not present

## 2020-04-05 DIAGNOSIS — C50911 Malignant neoplasm of unspecified site of right female breast: Secondary | ICD-10-CM

## 2020-04-05 DIAGNOSIS — Z170 Estrogen receptor positive status [ER+]: Secondary | ICD-10-CM

## 2020-04-05 DIAGNOSIS — C50411 Malignant neoplasm of upper-outer quadrant of right female breast: Secondary | ICD-10-CM | POA: Diagnosis not present

## 2020-04-05 DIAGNOSIS — C787 Secondary malignant neoplasm of liver and intrahepatic bile duct: Secondary | ICD-10-CM

## 2020-04-05 LAB — COMPREHENSIVE METABOLIC PANEL
ALT: 10 U/L (ref 0–44)
AST: 13 U/L — ABNORMAL LOW (ref 15–41)
Albumin: 3.4 g/dL — ABNORMAL LOW (ref 3.5–5.0)
Alkaline Phosphatase: 81 U/L (ref 38–126)
Anion gap: 7 (ref 5–15)
BUN: 30 mg/dL — ABNORMAL HIGH (ref 8–23)
CO2: 24 mmol/L (ref 22–32)
Calcium: 9.8 mg/dL (ref 8.9–10.3)
Chloride: 107 mmol/L (ref 98–111)
Creatinine, Ser: 1.28 mg/dL — ABNORMAL HIGH (ref 0.44–1.00)
GFR calc Af Amer: 49 mL/min — ABNORMAL LOW (ref >60)
GFR calc non Af Amer: 43 mL/min — ABNORMAL LOW (ref >60)
Glucose, Bld: 102 mg/dL — ABNORMAL HIGH (ref 70–99)
Potassium: 4.6 mmol/L (ref 3.5–5.1)
Sodium: 138 mmol/L (ref 135–145)
Total Bilirubin: 0.3 mg/dL (ref 0.3–1.2)
Total Protein: 7.2 g/dL (ref 6.5–8.1)

## 2020-04-05 LAB — CBC WITH DIFFERENTIAL/PLATELET
Abs Immature Granulocytes: 0.02 10*3/uL (ref 0.00–0.07)
Basophils Absolute: 0.1 10*3/uL (ref 0.0–0.1)
Basophils Relative: 2 %
Eosinophils Absolute: 0 10*3/uL (ref 0.0–0.5)
Eosinophils Relative: 1 %
HCT: 39.5 % (ref 36.0–46.0)
Hemoglobin: 13.2 g/dL (ref 12.0–15.0)
Immature Granulocytes: 1 %
Lymphocytes Relative: 20 %
Lymphs Abs: 0.9 10*3/uL (ref 0.7–4.0)
MCH: 34.6 pg — ABNORMAL HIGH (ref 26.0–34.0)
MCHC: 33.4 g/dL (ref 30.0–36.0)
MCV: 103.4 fL — ABNORMAL HIGH (ref 80.0–100.0)
Monocytes Absolute: 0.5 10*3/uL (ref 0.1–1.0)
Monocytes Relative: 12 %
Neutro Abs: 2.8 10*3/uL (ref 1.7–7.7)
Neutrophils Relative %: 64 %
Platelets: 155 10*3/uL (ref 150–400)
RBC: 3.82 MIL/uL — ABNORMAL LOW (ref 3.87–5.11)
RDW: 13.7 % (ref 11.5–15.5)
WBC: 4.4 10*3/uL (ref 4.0–10.5)
nRBC: 0 % (ref 0.0–0.2)

## 2020-04-05 MED ORDER — DENOSUMAB 120 MG/1.7ML ~~LOC~~ SOLN
120.0000 mg | Freq: Once | SUBCUTANEOUS | Status: AC
  Administered 2020-04-05: 120 mg via SUBCUTANEOUS

## 2020-04-05 MED ORDER — FULVESTRANT 250 MG/5ML IM SOLN
INTRAMUSCULAR | Status: AC
  Filled 2020-04-05: qty 10

## 2020-04-05 MED ORDER — DENOSUMAB 120 MG/1.7ML ~~LOC~~ SOLN
SUBCUTANEOUS | Status: AC
  Filled 2020-04-05: qty 1.7

## 2020-04-05 MED ORDER — FULVESTRANT 250 MG/5ML IM SOLN
500.0000 mg | Freq: Once | INTRAMUSCULAR | Status: AC
  Administered 2020-04-05: 500 mg via INTRAMUSCULAR

## 2020-04-05 NOTE — Patient Instructions (Signed)
Fulvestrant injection What is this medicine? FULVESTRANT (ful VES trant) blocks the effects of estrogen. It is used to treat breast cancer. This medicine may be used for other purposes; ask your health care provider or pharmacist if you have questions. COMMON BRAND NAME(S): FASLODEX What should I tell my health care provider before I take this medicine? They need to know if you have any of these conditions:  bleeding disorders  liver disease  low blood counts, like low white cell, platelet, or red cell counts  an unusual or allergic reaction to fulvestrant, other medicines, foods, dyes, or preservatives  pregnant or trying to get pregnant  breast-feeding How should I use this medicine? This medicine is for injection into a muscle. It is usually given by a health care professional in a hospital or clinic setting. Talk to your pediatrician regarding the use of this medicine in children. Special care may be needed. Overdosage: If you think you have taken too much of this medicine contact a poison control center or emergency room at once. NOTE: This medicine is only for you. Do not share this medicine with others. What if I miss a dose? It is important not to miss your dose. Call your doctor or health care professional if you are unable to keep an appointment. What may interact with this medicine?  medicines that treat or prevent blood clots like warfarin, enoxaparin, dalteparin, apixaban, dabigatran, and rivaroxaban This list may not describe all possible interactions. Give your health care provider a list of all the medicines, herbs, non-prescription drugs, or dietary supplements you use. Also tell them if you smoke, drink alcohol, or use illegal drugs. Some items may interact with your medicine. What should I watch for while using this medicine? Your condition will be monitored carefully while you are receiving this medicine. You will need important blood work done while you are taking  this medicine. Do not become pregnant while taking this medicine or for at least 1 year after stopping it. Women of child-bearing potential will need to have a negative pregnancy test before starting this medicine. Women should inform their doctor if they wish to become pregnant or think they might be pregnant. There is a potential for serious side effects to an unborn child. Men should inform their doctors if they wish to father a child. This medicine may lower sperm counts. Talk to your health care professional or pharmacist for more information. Do not breast-feed an infant while taking this medicine or for 1 year after the last dose. What side effects may I notice from receiving this medicine? Side effects that you should report to your doctor or health care professional as soon as possible:  allergic reactions like skin rash, itching or hives, swelling of the face, lips, or tongue  feeling faint or lightheaded, falls  pain, tingling, numbness, or weakness in the legs  signs and symptoms of infection like fever or chills; cough; flu-like symptoms; sore throat  vaginal bleeding Side effects that usually do not require medical attention (report to your doctor or health care professional if they continue or are bothersome):  aches, pains  constipation  diarrhea  headache  hot flashes  nausea, vomiting  pain at site where injected  stomach pain This list may not describe all possible side effects. Call your doctor for medical advice about side effects. You may report side effects to FDA at 1-800-FDA-1088. Where should I keep my medicine? This drug is given in a hospital or clinic and will   not be stored at home. NOTE: This sheet is a summary. It may not cover all possible information. If you have questions about this medicine, talk to your doctor, pharmacist, or health care provider.  2020 Elsevier/Gold Standard (2017-10-23 11:34:41) Denosumab injection What is this  medicine? DENOSUMAB (den oh sue mab) slows bone breakdown. Prolia is used to treat osteoporosis in women after menopause and in men, and in people who are taking corticosteroids for 6 months or more. Xgeva is used to treat a high calcium level due to cancer and to prevent bone fractures and other bone problems caused by multiple myeloma or cancer bone metastases. Xgeva is also used to treat giant cell tumor of the bone. This medicine may be used for other purposes; ask your health care provider or pharmacist if you have questions. COMMON BRAND NAME(S): Prolia, XGEVA What should I tell my health care provider before I take this medicine? They need to know if you have any of these conditions:  dental disease  having surgery or tooth extraction  infection  kidney disease  low levels of calcium or Vitamin D in the blood  malnutrition  on hemodialysis  skin conditions or sensitivity  thyroid or parathyroid disease  an unusual reaction to denosumab, other medicines, foods, dyes, or preservatives  pregnant or trying to get pregnant  breast-feeding How should I use this medicine? This medicine is for injection under the skin. It is given by a health care professional in a hospital or clinic setting. A special MedGuide will be given to you before each treatment. Be sure to read this information carefully each time. For Prolia, talk to your pediatrician regarding the use of this medicine in children. Special care may be needed. For Xgeva, talk to your pediatrician regarding the use of this medicine in children. While this drug may be prescribed for children as young as 13 years for selected conditions, precautions do apply. Overdosage: If you think you have taken too much of this medicine contact a poison control center or emergency room at once. NOTE: This medicine is only for you. Do not share this medicine with others. What if I miss a dose? It is important not to miss your dose. Call  your doctor or health care professional if you are unable to keep an appointment. What may interact with this medicine? Do not take this medicine with any of the following medications:  other medicines containing denosumab This medicine may also interact with the following medications:  medicines that lower your chance of fighting infection  steroid medicines like prednisone or cortisone This list may not describe all possible interactions. Give your health care provider a list of all the medicines, herbs, non-prescription drugs, or dietary supplements you use. Also tell them if you smoke, drink alcohol, or use illegal drugs. Some items may interact with your medicine. What should I watch for while using this medicine? Visit your doctor or health care professional for regular checks on your progress. Your doctor or health care professional may order blood tests and other tests to see how you are doing. Call your doctor or health care professional for advice if you get a fever, chills or sore throat, or other symptoms of a cold or flu. Do not treat yourself. This drug may decrease your body's ability to fight infection. Try to avoid being around people who are sick. You should make sure you get enough calcium and vitamin D while you are taking this medicine, unless your doctor tells   you not to. Discuss the foods you eat and the vitamins you take with your health care professional. See your dentist regularly. Brush and floss your teeth as directed. Before you have any dental work done, tell your dentist you are receiving this medicine. Do not become pregnant while taking this medicine or for 5 months after stopping it. Talk with your doctor or health care professional about your birth control options while taking this medicine. Women should inform their doctor if they wish to become pregnant or think they might be pregnant. There is a potential for serious side effects to an unborn child. Talk to your  health care professional or pharmacist for more information. What side effects may I notice from receiving this medicine? Side effects that you should report to your doctor or health care professional as soon as possible:  allergic reactions like skin rash, itching or hives, swelling of the face, lips, or tongue  bone pain  breathing problems  dizziness  jaw pain, especially after dental work  redness, blistering, peeling of the skin  signs and symptoms of infection like fever or chills; cough; sore throat; pain or trouble passing urine  signs of low calcium like fast heartbeat, muscle cramps or muscle pain; pain, tingling, numbness in the hands or feet; seizures  unusual bleeding or bruising  unusually weak or tired Side effects that usually do not require medical attention (report to your doctor or health care professional if they continue or are bothersome):  constipation  diarrhea  headache  joint pain  loss of appetite  muscle pain  runny nose  tiredness  upset stomach This list may not describe all possible side effects. Call your doctor for medical advice about side effects. You may report side effects to FDA at 1-800-FDA-1088. Where should I keep my medicine? This medicine is only given in a clinic, doctor's office, or other health care setting and will not be stored at home. NOTE: This sheet is a summary. It may not cover all possible information. If you have questions about this medicine, talk to your doctor, pharmacist, or health care provider.  2020 Elsevier/Gold Standard (2017-11-21 16:10:44)  

## 2020-04-06 ENCOUNTER — Other Ambulatory Visit: Payer: Self-pay | Admitting: *Deleted

## 2020-04-06 ENCOUNTER — Telehealth: Payer: Self-pay | Admitting: Nurse Practitioner

## 2020-04-06 LAB — CANCER ANTIGEN 27.29: CA 27.29: 319.3 U/mL — ABNORMAL HIGH (ref 0.0–38.6)

## 2020-04-06 NOTE — Telephone Encounter (Signed)
Scheduled office visit per 9/8 los. Pt confirmed new appt date and time.

## 2020-04-11 ENCOUNTER — Telehealth: Payer: Self-pay

## 2020-04-11 NOTE — Telephone Encounter (Signed)
Per provider appts from 10/27 rescheduled to 9/28 for CT scan and bone scan called left message concerning appt change pt was aware of pending change prior to scheduling

## 2020-04-13 ENCOUNTER — Other Ambulatory Visit: Payer: Self-pay

## 2020-04-13 ENCOUNTER — Ambulatory Visit
Admission: RE | Admit: 2020-04-13 | Discharge: 2020-04-13 | Disposition: A | Discharge status: Home / Self Care | Payer: Medicare Other | Source: Ambulatory Visit | Attending: Oncology | Admitting: Oncology

## 2020-04-13 DIAGNOSIS — Z1231 Encounter for screening mammogram for malignant neoplasm of breast: Secondary | ICD-10-CM | POA: Diagnosis not present

## 2020-04-25 ENCOUNTER — Encounter: Payer: Self-pay | Admitting: Oncology

## 2020-04-25 ENCOUNTER — Other Ambulatory Visit: Payer: Self-pay

## 2020-04-25 ENCOUNTER — Encounter (HOSPITAL_COMMUNITY): Payer: Self-pay

## 2020-04-25 ENCOUNTER — Ambulatory Visit (HOSPITAL_COMMUNITY)
Admission: RE | Admit: 2020-04-25 | Discharge: 2020-04-25 | Disposition: A | Discharge status: Home / Self Care | Payer: Medicare Other | Source: Ambulatory Visit | Attending: Oncology | Admitting: Oncology

## 2020-04-25 DIAGNOSIS — G35 Multiple sclerosis: Secondary | ICD-10-CM | POA: Diagnosis not present

## 2020-04-25 DIAGNOSIS — I700 Atherosclerosis of aorta: Secondary | ICD-10-CM | POA: Diagnosis not present

## 2020-04-25 DIAGNOSIS — M19011 Primary osteoarthritis, right shoulder: Secondary | ICD-10-CM | POA: Diagnosis not present

## 2020-04-25 DIAGNOSIS — C50411 Malignant neoplasm of upper-outer quadrant of right female breast: Secondary | ICD-10-CM | POA: Diagnosis not present

## 2020-04-25 DIAGNOSIS — I313 Pericardial effusion (noninflammatory): Secondary | ICD-10-CM | POA: Diagnosis not present

## 2020-04-25 DIAGNOSIS — C801 Malignant (primary) neoplasm, unspecified: Secondary | ICD-10-CM | POA: Insufficient documentation

## 2020-04-25 DIAGNOSIS — M160 Bilateral primary osteoarthritis of hip: Secondary | ICD-10-CM | POA: Diagnosis not present

## 2020-04-25 DIAGNOSIS — I3131 Malignant pericardial effusion in diseases classified elsewhere: Secondary | ICD-10-CM

## 2020-04-25 DIAGNOSIS — M545 Low back pain: Secondary | ICD-10-CM | POA: Diagnosis not present

## 2020-04-25 DIAGNOSIS — C7800 Secondary malignant neoplasm of unspecified lung: Secondary | ICD-10-CM | POA: Diagnosis not present

## 2020-04-25 DIAGNOSIS — Z170 Estrogen receptor positive status [ER+]: Secondary | ICD-10-CM

## 2020-04-25 DIAGNOSIS — J90 Pleural effusion, not elsewhere classified: Secondary | ICD-10-CM | POA: Diagnosis not present

## 2020-04-25 DIAGNOSIS — C50919 Malignant neoplasm of unspecified site of unspecified female breast: Secondary | ICD-10-CM | POA: Diagnosis not present

## 2020-04-25 DIAGNOSIS — C50911 Malignant neoplasm of unspecified site of right female breast: Secondary | ICD-10-CM | POA: Diagnosis not present

## 2020-04-25 MED ORDER — IOHEXOL 300 MG/ML  SOLN
75.0000 mL | Freq: Once | INTRAMUSCULAR | Status: AC | PRN
  Administered 2020-04-25: 60 mL via INTRAVENOUS

## 2020-04-25 MED ORDER — TECHNETIUM TC 99M MEDRONATE IV KIT
20.0000 | PACK | Freq: Once | INTRAVENOUS | Status: AC | PRN
  Administered 2020-04-25: 20.5 via INTRAVENOUS

## 2020-05-03 ENCOUNTER — Other Ambulatory Visit: Payer: Medicare Other

## 2020-05-03 ENCOUNTER — Ambulatory Visit: Payer: Medicare Other

## 2020-05-03 NOTE — Progress Notes (Signed)
[facility]  [phone] [phone]    ID: Barbara Parks DOB: 1951-06-20  MR#: [id number]  OEV#:[id number]  Patient Care Team: [name], MD as PCP - General (Family Medicine) Jerline Pain, MD (Cardiology) Gaye Pollack, MD (Cardiothoracic Surgery) Jenniferann Stuckert, Virgie Dad, MD as Consulting Physician (Oncology) OTHER MD: Dr Erroll Luna- Merlene Laughter, DDS; Gaynelle Arabian MD   CHIEF COMPLAINT: metastatic breast cancer, formerly on BOLERO study (s/p right mastectomy)  CURRENT TREATMENT: Fulvestrant, palbociclib, denosumab/Xgeva   INTERVAL HISTORY: Barbara Parks returns today for follow-up and treatment of her metastatic breast cancer.   At her last visit on 04/05/2020, she complained of wheezing. She underwent chest x-ray at that time showing: small right posterior pleural effusion versus pleural thickening; no evidence of pulmonary infiltrate or edema.  Since her last visit, she underwent left screening mammography with tomography at [facility] on 04/13/2020 showing: breast density category B; no evidence of malignancy.   She also underwent restaging scans on 04/25/2020. Chest CT showed: multifocal areas of sclerosis with increased sclerosis since previous imaging but with similar size, uncertain significance; very limited assessment of focal hepatic lesions, the one seen clearly is grossly similar; slight decrease in size of central lymph node/small central nodule adjacent to bronchi in superior right lower lobe; stable small right effusion; left adrenal adenoma.  Bone scan again showed multiple sites of osseous uptake, with no definite interval change noted.  She continues on fulvestrant, which was last received on 01/11/2020.  She tolerates this with no side effects that she is aware of and particularly with no particular discomfort on administration  She also continues on palbociclib.  She takes this at bedtime as it makes her a little bit sleepy.   Otherwise she has no immediate side effects from this medication  She also continues on denosumab/Xgeva, which was last received on 04/05/2020.  She has had no dental or other issues regarding this.  She did see her dentist within the last month she tells me.   REVIEW OF SYSTEMS: Barbara Parks has felt a little bit more tired in the last couple of months.  This was worse in August.  She also had significant GERD problems at that time.  She put herself on Prilosec for 2 weeks and that problem has resolved.  For the last 24 hours she has had a little bit more low back pain than usual.  She also has a little bit of pain across the right ribs today.  This is not something she has had for any length of time.  She was able to play golf last week, 9 holes, scored 44.  She also walks and does housework and yard work.  A detailed review of systems was otherwise benign.   BREAST CANCER HISTORY: From Dr. Collier [name]'s original intake note 04/13/2004:  "This woman has been in good health all of her life. She recently moved from [place] to work here.  She palpated a mass at about the 12 o'clock position in mid-July. She has not noticed any nipple retraction or skin changes.  She was seen by her primary care doctor who subsequently referred her for a mammogram.  Mammogram was performed on 03/01/04. This demonstrated a spiculated 2 cm mass at the 12 o'clock position in the right breast.  Upper outer quadrant of the left breast shows some distortion.   Physical exam at that time showed a firm, nontender nodule at the 12 o'clock position in the right breast, 5 cm  from the nipple.  Ultrasound of this area showed a hypoechoic ill-defined mass, measuring 2.2 x 1.3 x 1.4 cm.  Physical exam of the left breast showed general vague thickening, upper outer quadrant of the left breast with a discrete palpable mass. The ultrasound performed showed a single hypoechoic ill-defined nodule at the 1 o'clock position, measuring 7 x 5 x 6 mm.      She had biopsies of both lesions on 03/02/03.  Needle core biopsy of the lesion on the right breast revealed invasive mammary carcinoma.  Needle core biopsy of the left breast showed a complex fibroadenoma.  Prognostic panel of the lesion on the right breast showed it to be ER positive at 73%, PR positive at 90% and proliferative index 9%, HER-2 was 1+.  Patient was referred to Dr. Annamaria Boots, who performed a simple mastectomy with sentinel lymph node evaluation on 03/22/04.  Final pathology showed this to be an invasive ductal carcinoma  with lobular features, measuring 2.2 cm, grade 2 of 3.  Margins negative for carcinoma.  Invasive ductal carcinoma was extended to involve deep dermis of the nipple.  Lymphovascular invasion was identified.  Total of 4 sentinel lymph nodes were evaluated.  Touch imprints at the time of the OR was felt to be negative.  Subsequent evaluation showed a 5 mm focus of metastatic carcinoma in one of the four lymph nodes on microscopic after sectioning.  There was extracapsular extension  of one lymph node as well. The remaining three lymph nodes were all negative."  The patient's subsequent history is detailed above.   PAST MEDICAL HISTORY: Past Medical History:  Diagnosis Date  . Arthritis    left hip  . Asthma   . breast ca 2005   breast/chemo R mastectomy  . Dermatitis   . Diabetes mellitus without complication ([place])   . GERD (gastroesophageal reflux disease)   . Hypercholesteremia   . Metastasis to lung ([place]) dx'd 08/2011  . Osteopenia due to cancer therapy 09/09/2013  . Palpitations   . Personal history of chemotherapy   . Shortness of breath     PAST SURGICAL HISTORY: Past Surgical History:  Procedure Laterality Date  . BREAST SURGERY  2005   right  . CATARACT EXTRACTION W/PHACO Left 01/18/2014   Procedure: CATARACT EXTRACTION PHACO AND INTRAOCULAR LENS PLACEMENT (IOC);  Surgeon: Elta Guadeloupe T. Gershon Crane, MD;  Location: [facility];  Service: Ophthalmology;  Laterality:  Left;  CDE 15.79  . CATARACT EXTRACTION W/PHACO Right 02/08/2014   Procedure: CATARACT EXTRACTION PHACO AND INTRAOCULAR LENS PLACEMENT (IOC);  Surgeon: Elta Guadeloupe T. Gershon Crane, MD;  Location: [facility];  Service: Ophthalmology;  Laterality: Right;  CDE 4.41  . CHEST TUBE INSERTION  09/11/2011   Procedure: INSERTION PLEURAL DRAINAGE CATHETER;  Surgeon: Gaye Pollack, MD;  Location: [facility];  Service: Thoracic;  Laterality: Right;  . MASTECTOMY Right   . PERICARDIAL WINDOW  09/11/2011   Procedure: PERICARDIAL WINDOW;  Surgeon: Gaye Pollack, MD;  Location: [name];  Service: Thoracic;  Laterality: N/A;  . REMOVAL OF PLEURAL DRAINAGE CATHETER  12/19/2011   Procedure: REMOVAL OF PLEURAL DRAINAGE CATHETER;  Surgeon: Gaye Pollack, MD;  Location: [facility];  Service: Thoracic;  Laterality: Right;  TO BE DONE IN MINOR ROOM, SHORT STAY  . [place]  . TOTAL HIP ARTHROPLASTY Left 06/07/2015   Procedure: LEFT TOTAL HIP ARTHROPLASTY ANTERIOR APPROACH;  Surgeon: Gaynelle Arabian, MD;  Location: [facility];  Service: Orthopedics;  Laterality: Left;  Marland Kitchen VIDEO  BRONCHOSCOPY  09/11/2011   Procedure: VIDEO BRONCHOSCOPY;  Surgeon: Gaye Pollack, MD;  Location: [facility];  Service: Thoracic;  Laterality: N/A;    FAMILY HISTORY Family History  Problem Relation Age of Onset  . Cancer Mother        breast  . Heart disease Father   . Diabetes Other   . Anesthesia problems Neg Hx    The patient's mother was diagnosed with breast cancer at age 65, she died age 21 from congestive heart failure.The patient's father died from heart disease at age 10.  She has one sister alive & well.  Two brothers alive & well, one with diabetes. The patient's sister was also diagnosed with breast cancer, and was tested for the BRCA gene, and was negative. The patient herself has not been tested. There is no history of ovarian cancer in the family   GYNECOLOGIC HISTORY:  No LMP recorded. Patient is postmenopausal. Menarche age 68, the patient is GX P0.  She stopped having periods with chemotherapy in 2005. 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 She was a Archivist. She is single, lives alone with her Shitzu-poodle Sammie.. Family is all in the [place] area.     ADVANCED DIRECTIVES: Not in place. At the 02/23/2014 visit the patient was given the appropriate documents to complete and notarize at her discretion. She tells me she is planning to name her sister, Billie Ruddy, as healthcare power of attorney. [name] can be reached at [phone]   HEALTH MAINTENANCE: Social History   Tobacco Use  . Smoking status: Former Smoker    Packs/day: 1.50    Years: 30.00    Pack years: 45.00    Types: Cigarettes    Quit date: 09/10/2007    Years since quitting: 12.6  . Smokeless tobacco: Never Used  Substance Use Topics  . Alcohol use: No  . Drug use: No    Colonoscopy:  PAP:  Bone density: 04/09/2018, T score -1.8  Lipid panel:  Allergies  Allergen Reactions  . Aspirin     REACTION: upset stomach  . Latex Other (See Comments)    Blistering and skin peels off  . Nsaids Nausea And Vomiting    Extreme nausea and vomiting    Current Outpatient Medications  Medication Sig Dispense Refill  . cholecalciferol (VITAMIN D3) 25 MCG (1000 UT) tablet Take 2 tablets (2,000 Units total) by mouth daily. 180 tablet 4  . cyclobenzaprine (FLEXERIL) 10 MG tablet Take 1 tablet (10 mg total) by mouth 2 (two) times daily as needed for muscle spasms. 20 tablet 0  . gemfibrozil (LOPID) 600 MG tablet TAKE 1 TABLET (600 MG TOTAL) BY MOUTH 2 (TWO) TIMES DAILY BEFORE A MEAL. 180 tablet 1  . loperamide (IMODIUM) 2 MG capsule Take 2 mg by mouth 4 (four) times daily as needed for diarrhea or loose stools. Reported on 11/30/2015    . metFORMIN (GLUCOPHAGE-XR) 500 MG 24 hr tablet Take 1 tablet (500 mg total) by mouth 3 (three) times daily before  meals.  3  . palbociclib (IBRANCE) 125 MG tablet Take 1 tablet (125 mg total) by mouth daily. Take for 21 days on, 7 days off, repeat every 28 days. (Patient not taking: Reported on 04/05/2020) 21 tablet 6  . traMADol (ULTRAM) 50 MG tablet Take 0.5-1 tablets (25-50 mg total) by mouth every 6 (six)  hours as needed. (Patient not taking: Reported on 04/05/2020) 30 tablet 0   No current facility-administered medications for this visit.    OBJECTIVE: White woman who appears stated age 61:   05/04/20 1250  BP: 128/87  Pulse: 66  Resp: 18  Temp: 98 F (36.7 C)  SpO2: 96%   Wt Readings from Last 3 Encounters:  05/04/20 154 lb 9.6 oz (70.1 kg)  04/05/20 153 lb 9.6 oz (69.7 kg)  02/08/20 149 lb 1.6 oz (67.6 kg)   Body mass index is 26.13 kg/m.    ECOG FS:1 - Symptomatic but completely ambulatory  Sclerae unicteric, EOMs intact Wearing a mask No cervical or supraclavicular adenopathy Lungs no rales or rhonchi Heart regular rate and rhythm Abd soft, nontender, positive bowel sounds MSK no focal spinal tenderness, no upper extremity lymphedema Neuro: nonfocal, well oriented, appropriate affect Breasts: The right breast is status post mastectomy.  The left breast is unremarkable.  Both axillae are benign.   LAB  RESULTS:  CMP     Component Value Date/Time   NA 137 05/04/2020 1224   NA 139 07/08/2017 0942   K 4.3 05/04/2020 1224   K 3.8 07/08/2017 0942   CL 106 05/04/2020 1224   CL 109 (H) 12/31/2012 0940   CO2 24 05/04/2020 1224   CO2 18 (L) 07/08/2017 0942   GLUCOSE 117 (H) 05/04/2020 1224   GLUCOSE 156 (H) 07/08/2017 0942   GLUCOSE 127 (H) 12/31/2012 0940   BUN 27 (H) 05/04/2020 1224   BUN 17.7 07/08/2017 0942   CREATININE 1.23 (H) 05/04/2020 1224   CREATININE 1.18 (H) 12/25/2017 0959   CREATININE 1.0 07/08/2017 0942   CALCIUM 10.0 05/04/2020 1224   CALCIUM 10.4 07/08/2017 0942   PROT 7.7 05/04/2020 1224   PROT 7.7 07/08/2017 0942   ALBUMIN 3.5 05/04/2020 1224    ALBUMIN 3.4 (L) 07/08/2017 0942   AST 15 05/04/2020 1224   AST 50 (H) 12/25/2017 0959   AST 54 (H) 07/08/2017 0942   ALT 15 05/04/2020 1224   ALT 53 12/25/2017 0959   ALT 39 07/08/2017 0942   ALKPHOS 95 05/04/2020 1224   ALKPHOS 232 (H) 07/08/2017 0942   BILITOT 0.3 05/04/2020 1224   BILITOT 0.3 12/25/2017 0959   BILITOT 0.42 07/08/2017 0942   GFRNONAA 45 (L) 05/04/2020 1224   GFRNONAA 47 (L) 12/25/2017 0959   GFRAA 49 (L) 04/05/2020 1504   GFRAA 54 (L) 12/25/2017 0959    I No results found for: SPEP  Lab Results  Component Value Date   WBC 5.4 05/04/2020   NEUTROABS 3.8 05/04/2020   HGB 14.3 05/04/2020   HCT 41.4 05/04/2020   MCV 102.0 (H) 05/04/2020   PLT 184 05/04/2020      Chemistry      Component Value Date/Time   NA 137 05/04/2020 1224   NA 139 07/08/2017 0942   K 4.3 05/04/2020 1224   K 3.8 07/08/2017 0942   CL 106 05/04/2020 1224   CL 109 (H) 12/31/2012 0940   CO2 24 05/04/2020 1224   CO2 18 (L) 07/08/2017 0942   BUN 27 (H) 05/04/2020 1224   BUN 17.7 07/08/2017 0942   CREATININE 1.23 (H) 05/04/2020 1224   CREATININE 1.18 (H) 12/25/2017 0959   CREATININE 1.0 07/08/2017 0942      Component Value Date/Time   CALCIUM 10.0 05/04/2020 1224   CALCIUM 10.4 07/08/2017 0942   ALKPHOS 95 05/04/2020 1224   ALKPHOS 232 (H) 07/08/2017 0942   AST  15 05/04/2020 1224   AST 50 (H) 12/25/2017 0959   AST 54 (H) 07/08/2017 0942   ALT 15 05/04/2020 1224   ALT 53 12/25/2017 0959   ALT 39 07/08/2017 0942   BILITOT 0.3 05/04/2020 1224   BILITOT 0.3 12/25/2017 0959   BILITOT 0.42 07/08/2017 0942       Lab Results  Component Value Date   LABCA2 58 (H) 09/14/2012    No components found for: [id number]  No results for input(s): INR in the last 168 hours.  Urinalysis    Component Value Date/Time   COLORURINE YELLOW 11/25/2017 0901   APPEARANCEUR HAZY (A) 11/25/2017 0901   LABSPEC 1.017 11/25/2017 0901   LABSPEC 1.030 07/13/2015 0841   PHURINE 5.0 11/25/2017  0901   GLUCOSEU NEGATIVE 11/25/2017 0901   GLUCOSEU Negative 07/13/2015 0841   HGBUR MODERATE (A) 11/25/2017 0901   BILIRUBINUR NEGATIVE 11/25/2017 0901   BILIRUBINUR Negative 07/13/2015 0841   KETONESUR NEGATIVE 11/25/2017 0901   PROTEINUR 100 (A) 11/25/2017 0901   UROBILINOGEN 0.2 07/13/2015 0841   NITRITE NEGATIVE 11/25/2017 0901   LEUKOCYTESUR NEGATIVE 11/25/2017 0901   LEUKOCYTESUR Negative 07/13/2015 0841    STUDIES: DG Chest 2 View  Result Date: 04/06/2020 CLINICAL DATA:  Wheezing for several days. Metastatic breast carcinoma. EXAM: CHEST - 2 VIEW COMPARISON:  12/12/2018 FINDINGS: The heart size and mediastinal contours are within normal limits. Small right posterior pleural effusion versus pleural thickening is noted, which is best seen on lateral projection. No evidence of pulmonary infiltrate or edema. Postop changes again noted from right mastectomy and axillary lymph node dissection. The visualized skeletal structures are unremarkable. IMPRESSION: Small right posterior pleural effusion versus pleural thickening. No evidence of pulmonary infiltrate or edema. Electronically Signed   By: Marlaine Hind M.D.   On: 04/06/2020 08:39   CT Chest W Contrast  Result Date: 04/25/2020 CLINICAL DATA:  Breast cancer staging, follow-up of measurable disease, currently on systemic oral therapy. Post RIGHT mastectomy EXAM: CT CHEST WITH CONTRAST TECHNIQUE: Multidetector CT imaging of the chest was performed during intravenous contrast administration. CONTRAST:  22m OMNIPAQUE IOHEXOL 300 MG/ML  SOLN COMPARISON:  None. FINDINGS: Cardiovascular: Calcified and noncalcified atheromatous plaque of the thoracic aorta. Heart size is normal without pericardial effusion. No aneurysmal dilation of the thoracic aorta. Central pulmonary vasculature unremarkable on venous phase assessment. Mediastinum/Nodes: Thoracic inlet structures are unremarkable. No axillary lymphadenopathy. Post RIGHT axillary dissection and  RIGHT mastectomy. Lungs/Pleura: Small RIGHT-sided pleural effusion is unchanged from November 01, 2019. Areas of associated airspace disease are also stable. No new consolidation or sign of pleural effusion. Similar appearance of soft tissue adjacent to lower lobe bronchi on image 89 of series 7 at approximately 9 mm however on today's study as compared to 11 mm previously. Upper Abdomen: Incidental imaging of upper abdominal contents without acute process. Imaged portions of liver, gallbladder, biliary tree, pancreas, spleen and adrenal glands is unchanged. Well-circumscribed low-density LEFT adrenal lesion is stable, compatible with LEFT adrenal adenoma. Focal hepatic lesions not well seen on the current exam with grossly similar appearance of the lateral segment LEFT hepatic lobe lesion along the fissure for false form ligament on image 144 of series 2. Lesion in the inferior LEFT hepatic lobe in hepatic subsegment III is not as well seen as on the prior CT nor the prior MRI. Musculoskeletal: Multifocal areas of sclerosis more sclerotic than on prior imaging studies. Most notably in the bilateral scapula and T4. There is increasing sclerosis and a small  lesion in T5 which appears of similar size. Less lytic change in T4 than on previous imaging with more sclerotic appearance. Subtle area in T11 on the prior study is now densely sclerotic as well, seen only in hindsight. IMPRESSION: 1. Multifocal areas of sclerosis with increased sclerosis since previous imaging but with similar size accounting for areas not as well seen on the prior study. The increase in sclerotic change may reflect treatment related effect but remains of uncertain significance. Attention on follow-up. 2. Very limited assessment of focal hepatic lesions which are better seen on previous studies. The only lesion that is seen clearly on today's exam appears grossly similar to prior imaging. 3. Central lymph node or small central nodule adjacent to  lower lobe bronchi in the superior segment of the RIGHT lower lobe with slight decreased size compared to previous imaging. 4. Stable small RIGHT effusion 5. LEFT adrenal adenoma. 6. Aortic atherosclerosis. Aortic Atherosclerosis (ICD10-I70.0). Electronically Signed   By: Zetta Bills M.D.   On: 04/25/2020 15:12   NM Bone Scan Whole Body  Result Date: 04/26/2020 CLINICAL DATA:  Breast cancer, having low back pain, rib pain, RIGHT hip pain and scapular pain EXAM: NUCLEAR MEDICINE WHOLE BODY BONE SCAN TECHNIQUE: Whole body anterior and posterior images were obtained approximately 3 hours after intravenous injection of radiopharmaceutical. RADIOPHARMACEUTICALS:  20.5 mCi Technetium-81mMDP IV COMPARISON:  11/11/2019 Correlation: CT chest 04/25/2020 FINDINGS: Multiple abnormal sites of increased tracer uptake are seen consistent with osseous metastases. These include a few BILATERAL ribs, BILATERAL scapula, suspect manubrium, upper thoracic spine approximately T4, L3, proximal RIGHT femur, and LEFT sacrum. Overall appearance unchanged LEFT prosthesis. Degenerative type uptake at shoulders bilaterally and at RIGHT hip joint. Expected urinary tract and soft tissue distribution of tracer. IMPRESSION: Multiple sites of osseous uptake consistent with osseous metastases. No definite interval change. Electronically Signed   By: MLavonia DanaM.D.   On: 04/26/2020 10:21   MM 3D SCREEN BREAST UNI LEFT  Result Date: 04/14/2020 CLINICAL DATA:  Screening. EXAM: DIGITAL SCREENING UNILATERAL LEFT MAMMOGRAM WITH CAD AND TOMO COMPARISON:  Previous exam(s). ACR Breast Density Category b: There are scattered areas of fibroglandular density. FINDINGS: The patient has had a right mastectomy. There are no findings suspicious for malignancy. Images were processed with CAD. IMPRESSION: No mammographic evidence of malignancy. A result letter of this screening mammogram will be mailed directly to the patient. RECOMMENDATION: Screening  mammogram in one year.  (Code:[id number]-L-61M) BI-RADS CATEGORY  1: Negative. Electronically Signed   By: DFidela SalisburyM.D.   On: 04/14/2020 17:08    ASSESSMENT: 69y.o. Barbara Parks,  woman with stage IV breast cancer, on BOLERO-4 trial  (1) status post right mastectomy and sentinel lymph node sampling 03/22/2004 for a right upper-outer quadrant pT2 pN1, stage IIB invasive ductal carcinoma with lobular features, grade 2, estrogen receptor and progesterone receptor positive, HER-2 negative, with an MIB-1 of 9% (([id number] [id number]  (2) addition all right axillary lymph node sampling 05/07/2004 showed 2 benign lymph nodes (4 lymph nodes previously removed, so total was one positive lymph node out of 6; [id number]  (3) the patient was evaluated by radiation oncology; no postmastectomy radiation recommended  (4) adjuvant chemotherapy with dose dense doxorubicin and cyclophosphamide x4 cycles (first cycle delayed one week) followed by dose dense paclitaxel x4 was completed 09/18/2004  (5) tamoxifen started March 2006, discontinued 2009  METASTATIC DISEASE: February 2013 (6) presenting with a large pericardial effusion, large right pleural effusion and possible right middle  lobe bronchial obstruction February 2013, status post pericardial window placement, fiberoptic bronchoscopy and right Pleurx placement 09/11/2011, with biopsy of the bronchus intermedius and pericardium positive for metastatic breast cancer, estrogen receptor 91% positive with moderate staining intensity, progesterone receptor 100% positive with strong staining intensity, with an MIB-1 of 35%, and no HER-2 amplification, the signals ratio being 1.37 ([id number])  (7) enrolled in BOLERO-4 trial 10/10/2011, receiving letrozole and everolimus  (a) two small areas of enhancement in the cerebellum noted by brain MRI 10/03/2011 were no longer apparent on repeat MRI 08/21/2012-- most recent brain MRI 04/01/2014 showed no evidence of  intracranial metastatic disease  (b) sclerotic lesions in left iliac bone and sacrum have not been biopsied; stable; to start zolendronate after patient updates her dental care (extraction planned)--never started  (c) RLL lung nodule, stable (rescanned 07/12/2014 and 08/11/2014) R hilar and subcarinal lymph nodes: stable  (d) CT of the chest:03/25/2018, stable  (e) CT Angio 12/12/2018 stable  (f) off BOLERO trial (trial ended) as of July 2020, but continuing on study drugs  (g) letrozole and everolimus discontinued January 2021 with disease progression  (8) additional problems:  (a) hepatic steatosis  (b) COPD/ emphysema/ asthma  (c) advanced L hip osteoarthritis, status post left total hip replacement 06/07/2015  (d) aortoiliac atherosclerosis  (e) dental evaluation pending w possible dental extractions  (f) likely thalassemia trait  (g) hyperlipidemia  (9) Bone density concerns: DEXA scan 08/11/2013 was normal  (a) repeat DEXA scan 04/09/2018 shows a T score of -1.8, osteopenia  (10) disease monitoring:  (a) PET scan 12/22/2018 shows no hypermetabolic soft tissue metastases but multifocal bony metastasis  (b) denosumab/Xgeva started on 02/25/2019, repeated every 28 days (delayed due to dental extraction healing issues)  (c) bone scan 05/13/2019 serves as baseline study with increased activity in the right scapular, lower ribs and right femoral head (stable compared to PET scan of May 2020  (d) bone scan 07/09/2019 stable  (e) CT chest 07/09/2019 shows stable lung lesions but emerging left liver lobe 0.7 cm lesion  (f) liver MRI 08/03/2019 shows approximately 8 liver lesions, the largest measuring 1.8 cm.  (g) liver biopsy 08/06/2019 confirms metastatic breast cancer, estrogen and progesterone receptor positive, with an MIB-1 of 5% and HER-2 not amplified  (h) chest CT and bone scan 11/11/2019 essentially stable  (i) liver MRI 02/01/2020 stable to improved  (11) denosumab/Xgeva begun  02/25/2019 repeated every 4 weeks  (12) fulvestrant started 08/24/2019  (a) palbociclib 125 mg daily 21 days on 7 days off started 08/25/2019   PLAN: Delania is 8-1/2 years out from definitive diagnosis of metastatic breast cancer.  We reviewed her CT of the chest and bone scan in detail.  To the best of my ability to tell this shows stable disease and even a little bit of response in terms of measurable lesions.  Accordingly we are continuing with fulvestrant and palbociclib.  As well as the denosumab/Xgeva.  Her counts remain excellent so we do not have to adjust the palbociclib dose.  I have encouraged her to continue to be as active as possible.  She is getting benefit from back massage and that is fine.  If she develops more persistent pain we can proceed to MRIs of any particular area and consider palliative radiation if necessary.  Otherwise she will return monthly for the fulvestrant and lab work.  She will see [place] again at the very end of December but before that visit she will have CT scans  of the chest abdomen and pelvis.  She knows to call for any other issue that may develop before then  Encounter time 25 minutes.Lurline Del, MD 05/04/20 1:12 PM Medical Oncology and Hematology [facility] [place], [place] [address] Tel. [phone]    Fax. [phone]   I, Wilburn Mylar, am acting as scribe for Dr. Virgie Dad. [place] Throckmorton.  I, Lurline Del MD, have reviewed the above documentation for accuracy and completeness, and I agree with the above.    *Total Encounter Time as defined by the Centers for Medicare and Medicaid Services includes, in addition to the face-to-face time of a patient visit (documented in the note above) non-face-to-face time: obtaining and reviewing outside history, ordering and reviewing medications, tests or procedures, care coordination (communications with other health care professionals or caregivers) and  documentation in the medical record.

## 2020-05-04 ENCOUNTER — Inpatient Hospital Stay: Payer: Medicare Other | Attending: Oncology

## 2020-05-04 ENCOUNTER — Other Ambulatory Visit: Payer: Self-pay

## 2020-05-04 ENCOUNTER — Inpatient Hospital Stay: Payer: Medicare Other

## 2020-05-04 ENCOUNTER — Inpatient Hospital Stay (HOSPITAL_BASED_OUTPATIENT_CLINIC_OR_DEPARTMENT_OTHER): Payer: Medicare Other | Admitting: Oncology

## 2020-05-04 VITALS — BP 128/87 | HR 66 | Temp 98.0°F | Resp 18 | Ht 64.5 in | Wt 154.6 lb

## 2020-05-04 DIAGNOSIS — C7951 Secondary malignant neoplasm of bone: Secondary | ICD-10-CM

## 2020-05-04 DIAGNOSIS — I3131 Malignant pericardial effusion in diseases classified elsewhere: Secondary | ICD-10-CM

## 2020-05-04 DIAGNOSIS — C50911 Malignant neoplasm of unspecified site of right female breast: Secondary | ICD-10-CM

## 2020-05-04 DIAGNOSIS — Z170 Estrogen receptor positive status [ER+]: Secondary | ICD-10-CM | POA: Diagnosis not present

## 2020-05-04 DIAGNOSIS — Z5111 Encounter for antineoplastic chemotherapy: Secondary | ICD-10-CM | POA: Insufficient documentation

## 2020-05-04 DIAGNOSIS — I313 Pericardial effusion (noninflammatory): Secondary | ICD-10-CM | POA: Diagnosis not present

## 2020-05-04 DIAGNOSIS — C7800 Secondary malignant neoplasm of unspecified lung: Secondary | ICD-10-CM

## 2020-05-04 DIAGNOSIS — C50411 Malignant neoplasm of upper-outer quadrant of right female breast: Secondary | ICD-10-CM | POA: Insufficient documentation

## 2020-05-04 DIAGNOSIS — C801 Malignant (primary) neoplasm, unspecified: Secondary | ICD-10-CM

## 2020-05-04 DIAGNOSIS — C787 Secondary malignant neoplasm of liver and intrahepatic bile duct: Secondary | ICD-10-CM | POA: Diagnosis not present

## 2020-05-04 LAB — CBC WITH DIFFERENTIAL/PLATELET
Abs Immature Granulocytes: 0.04 10*3/uL (ref 0.00–0.07)
Basophils Absolute: 0.1 10*3/uL (ref 0.0–0.1)
Basophils Relative: 2 %
Eosinophils Absolute: 0 10*3/uL (ref 0.0–0.5)
Eosinophils Relative: 0 %
HCT: 41.4 % (ref 36.0–46.0)
Hemoglobin: 14.3 g/dL (ref 12.0–15.0)
Immature Granulocytes: 1 %
Lymphocytes Relative: 17 %
Lymphs Abs: 0.9 10*3/uL (ref 0.7–4.0)
MCH: 35.2 pg — ABNORMAL HIGH (ref 26.0–34.0)
MCHC: 34.5 g/dL (ref 30.0–36.0)
MCV: 102 fL — ABNORMAL HIGH (ref 80.0–100.0)
Monocytes Absolute: 0.5 10*3/uL (ref 0.1–1.0)
Monocytes Relative: 9 %
Neutro Abs: 3.8 10*3/uL (ref 1.7–7.7)
Neutrophils Relative %: 71 %
Platelets: 184 10*3/uL (ref 150–400)
RBC: 4.06 MIL/uL (ref 3.87–5.11)
RDW: 14 % (ref 11.5–15.5)
WBC: 5.4 10*3/uL (ref 4.0–10.5)
nRBC: 0 % (ref 0.0–0.2)

## 2020-05-04 LAB — COMPREHENSIVE METABOLIC PANEL
ALT: 15 U/L (ref 0–44)
AST: 15 U/L (ref 15–41)
Albumin: 3.5 g/dL (ref 3.5–5.0)
Alkaline Phosphatase: 95 U/L (ref 38–126)
Anion gap: 7 (ref 5–15)
BUN: 27 mg/dL — ABNORMAL HIGH (ref 8–23)
CO2: 24 mmol/L (ref 22–32)
Calcium: 10 mg/dL (ref 8.9–10.3)
Chloride: 106 mmol/L (ref 98–111)
Creatinine, Ser: 1.23 mg/dL — ABNORMAL HIGH (ref 0.44–1.00)
GFR calc non Af Amer: 45 mL/min — ABNORMAL LOW (ref >60)
Glucose, Bld: 117 mg/dL — ABNORMAL HIGH (ref 70–99)
Potassium: 4.3 mmol/L (ref 3.5–5.1)
Sodium: 137 mmol/L (ref 135–145)
Total Bilirubin: 0.3 mg/dL (ref 0.3–1.2)
Total Protein: 7.7 g/dL (ref 6.5–8.1)

## 2020-05-04 MED ORDER — FULVESTRANT 250 MG/5ML IM SOLN
INTRAMUSCULAR | Status: AC
  Filled 2020-05-04: qty 10

## 2020-05-04 MED ORDER — DENOSUMAB 120 MG/1.7ML ~~LOC~~ SOLN
120.0000 mg | Freq: Once | SUBCUTANEOUS | Status: AC
  Administered 2020-05-04: 120 mg via SUBCUTANEOUS

## 2020-05-04 MED ORDER — DENOSUMAB 120 MG/1.7ML ~~LOC~~ SOLN
SUBCUTANEOUS | Status: AC
  Filled 2020-05-04: qty 1.7

## 2020-05-04 MED ORDER — FULVESTRANT 250 MG/5ML IM SOLN
500.0000 mg | Freq: Once | INTRAMUSCULAR | Status: AC
  Administered 2020-05-04: 500 mg via INTRAMUSCULAR

## 2020-05-04 NOTE — Patient Instructions (Signed)
Denosumab injection °What is this medicine? °DENOSUMAB (den oh sue mab) slows bone breakdown. Prolia is used to treat osteoporosis in women after menopause and in men, and in people who are taking corticosteroids for 6 months or more. Xgeva is used to treat a high calcium level due to cancer and to prevent bone fractures and other bone problems caused by multiple myeloma or cancer bone metastases. Xgeva is also used to treat giant cell tumor of the bone. °This medicine may be used for other purposes; ask your health care provider or pharmacist if you have questions. °COMMON BRAND NAME(S): Prolia, XGEVA °What should I tell my health care provider before I take this medicine? °They need to know if you have any of these conditions: °· dental disease °· having surgery or tooth extraction °· infection °· kidney disease °· low levels of calcium or Vitamin D in the blood °· malnutrition °· on hemodialysis °· skin conditions or sensitivity °· thyroid or parathyroid disease °· an unusual reaction to denosumab, other medicines, foods, dyes, or preservatives °· pregnant or trying to get pregnant °· breast-feeding °How should I use this medicine? °This medicine is for injection under the skin. It is given by a health care professional in a hospital or clinic setting. °A special MedGuide will be given to you before each treatment. Be sure to read this information carefully each time. °For Prolia, talk to your pediatrician regarding the use of this medicine in children. Special care may be needed. For Xgeva, talk to your pediatrician regarding the use of this medicine in children. While this drug may be prescribed for children as young as 13 years for selected conditions, precautions do apply. °Overdosage: If you think you have taken too much of this medicine contact a poison control center or emergency room at once. °NOTE: This medicine is only for you. Do not share this medicine with others. °What if I miss a dose? °It is  important not to miss your dose. Call your doctor or health care professional if you are unable to keep an appointment. °What may interact with this medicine? °Do not take this medicine with any of the following medications: °· other medicines containing denosumab °This medicine may also interact with the following medications: °· medicines that lower your chance of fighting infection °· steroid medicines like prednisone or cortisone °This list may not describe all possible interactions. Give your health care provider a list of all the medicines, herbs, non-prescription drugs, or dietary supplements you use. Also tell them if you smoke, drink alcohol, or use illegal drugs. Some items may interact with your medicine. °What should I watch for while using this medicine? °Visit your doctor or health care professional for regular checks on your progress. Your doctor or health care professional may order blood tests and other tests to see how you are doing. °Call your doctor or health care professional for advice if you get a fever, chills or sore throat, or other symptoms of a cold or flu. Do not treat yourself. This drug may decrease your body's ability to fight infection. Try to avoid being around people who are sick. °You should make sure you get enough calcium and vitamin D while you are taking this medicine, unless your doctor tells you not to. Discuss the foods you eat and the vitamins you take with your health care professional. °See your dentist regularly. Brush and floss your teeth as directed. Before you have any dental work done, tell your dentist you are   receiving this medicine. Do not become pregnant while taking this medicine or for 5 months after stopping it. Talk with your doctor or health care professional about your birth control options while taking this medicine. Women should inform their doctor if they wish to become pregnant or think they might be pregnant. There is a potential for serious side  effects to an unborn child. Talk to your health care professional or pharmacist for more information. What side effects may I notice from receiving this medicine? Side effects that you should report to your doctor or health care professional as soon as possible:  allergic reactions like skin rash, itching or hives, swelling of the face, lips, or tongue  bone pain  breathing problems  dizziness  jaw pain, especially after dental work  redness, blistering, peeling of the skin  signs and symptoms of infection like fever or chills; cough; sore throat; pain or trouble passing urine  signs of low calcium like fast heartbeat, muscle cramps or muscle pain; pain, tingling, numbness in the hands or feet; seizures  unusual bleeding or bruising  unusually weak or tired Side effects that usually do not require medical attention (report to your doctor or health care professional if they continue or are bothersome):  constipation  diarrhea  headache  joint pain  loss of appetite  muscle pain  runny nose  tiredness  upset stomach This list may not describe all possible side effects. Call your doctor for medical advice about side effects. You may report side effects to FDA at 1-800-FDA-1088. Where should I keep my medicine? This medicine is only given in a clinic, doctor's office, or other health care setting and will not be stored at home. NOTE: This sheet is a summary. It may not cover all possible information. If you have questions about this medicine, talk to your doctor, pharmacist, or health care provider.  2020 Elsevier/Gold Standard (2017-11-21 16:10:44) Fulvestrant injection What is this medicine? FULVESTRANT (ful VES trant) blocks the effects of estrogen. It is used to treat breast cancer. This medicine may be used for other purposes; ask your health care provider or pharmacist if you have questions. COMMON BRAND NAME(S): FASLODEX What should I tell my health care  provider before I take this medicine? They need to know if you have any of these conditions:  bleeding disorders  liver disease  low blood counts, like low white cell, platelet, or red cell counts  an unusual or allergic reaction to fulvestrant, other medicines, foods, dyes, or preservatives  pregnant or trying to get pregnant  breast-feeding How should I use this medicine? This medicine is for injection into a muscle. It is usually given by a health care professional in a hospital or clinic setting. Talk to your pediatrician regarding the use of this medicine in children. Special care may be needed. Overdosage: If you think you have taken too much of this medicine contact a poison control center or emergency room at once. NOTE: This medicine is only for you. Do not share this medicine with others. What if I miss a dose? It is important not to miss your dose. Call your doctor or health care professional if you are unable to keep an appointment. What may interact with this medicine?  medicines that treat or prevent blood clots like warfarin, enoxaparin, dalteparin, apixaban, dabigatran, and rivaroxaban This list may not describe all possible interactions. Give your health care provider a list of all the medicines, herbs, non-prescription drugs, or dietary supplements you use.   Also tell them if you smoke, drink alcohol, or use illegal drugs. Some items may interact with your medicine. What should I watch for while using this medicine? Your condition will be monitored carefully while you are receiving this medicine. You will need important blood work done while you are taking this medicine. Do not become pregnant while taking this medicine or for at least 1 year after stopping it. Women of child-bearing potential will need to have a negative pregnancy test before starting this medicine. Women should inform their doctor if they wish to become pregnant or think they might be pregnant. There is  a potential for serious side effects to an unborn child. Men should inform their doctors if they wish to father a child. This medicine may lower sperm counts. Talk to your health care professional or pharmacist for more information. Do not breast-feed an infant while taking this medicine or for 1 year after the last dose. What side effects may I notice from receiving this medicine? Side effects that you should report to your doctor or health care professional as soon as possible:  allergic reactions like skin rash, itching or hives, swelling of the face, lips, or tongue  feeling faint or lightheaded, falls  pain, tingling, numbness, or weakness in the legs  signs and symptoms of infection like fever or chills; cough; flu-like symptoms; sore throat  vaginal bleeding Side effects that usually do not require medical attention (report to your doctor or health care professional if they continue or are bothersome):  aches, pains  constipation  diarrhea  headache  hot flashes  nausea, vomiting  pain at site where injected  stomach pain This list may not describe all possible side effects. Call your doctor for medical advice about side effects. You may report side effects to FDA at 1-800-FDA-1088. Where should I keep my medicine? This drug is given in a hospital or clinic and will not be stored at home. NOTE: This sheet is a summary. It may not cover all possible information. If you have questions about this medicine, talk to your doctor, pharmacist, or health care provider.  2020 Elsevier/Gold Standard (2017-10-23 11:34:41)  

## 2020-05-05 LAB — CANCER ANTIGEN 27.29: CA 27.29: 321 U/mL — ABNORMAL HIGH (ref 0.0–38.6)

## 2020-05-09 ENCOUNTER — Telehealth: Payer: Self-pay | Admitting: Oncology

## 2020-05-09 NOTE — Telephone Encounter (Signed)
Scheduled per 10/7 los. Pt will receive an updated appt calendar at next visit per appt note

## 2020-05-24 ENCOUNTER — Encounter (HOSPITAL_COMMUNITY): Payer: Medicare Other

## 2020-05-24 ENCOUNTER — Other Ambulatory Visit (HOSPITAL_COMMUNITY): Payer: Medicare Other

## 2020-05-24 ENCOUNTER — Ambulatory Visit (HOSPITAL_COMMUNITY): Payer: Medicare Other

## 2020-05-30 ENCOUNTER — Other Ambulatory Visit: Payer: Self-pay

## 2020-05-30 ENCOUNTER — Inpatient Hospital Stay: Payer: Medicare Other | Attending: Oncology | Admitting: Oncology

## 2020-05-30 VITALS — BP 133/73 | HR 118 | Temp 97.6°F | Resp 18 | Ht 64.5 in | Wt 154.9 lb

## 2020-05-30 DIAGNOSIS — C50411 Malignant neoplasm of upper-outer quadrant of right female breast: Secondary | ICD-10-CM | POA: Diagnosis not present

## 2020-05-30 DIAGNOSIS — C7800 Secondary malignant neoplasm of unspecified lung: Secondary | ICD-10-CM

## 2020-05-30 DIAGNOSIS — I313 Pericardial effusion (noninflammatory): Secondary | ICD-10-CM | POA: Diagnosis not present

## 2020-05-30 DIAGNOSIS — C787 Secondary malignant neoplasm of liver and intrahepatic bile duct: Secondary | ICD-10-CM | POA: Diagnosis not present

## 2020-05-30 DIAGNOSIS — Z170 Estrogen receptor positive status [ER+]: Secondary | ICD-10-CM | POA: Diagnosis not present

## 2020-05-30 DIAGNOSIS — C7951 Secondary malignant neoplasm of bone: Secondary | ICD-10-CM | POA: Insufficient documentation

## 2020-05-30 DIAGNOSIS — I3131 Malignant pericardial effusion in diseases classified elsewhere: Secondary | ICD-10-CM

## 2020-05-30 DIAGNOSIS — M8580 Other specified disorders of bone density and structure, unspecified site: Secondary | ICD-10-CM | POA: Diagnosis not present

## 2020-05-30 DIAGNOSIS — C801 Malignant (primary) neoplasm, unspecified: Secondary | ICD-10-CM | POA: Diagnosis not present

## 2020-05-30 DIAGNOSIS — Z5111 Encounter for antineoplastic chemotherapy: Secondary | ICD-10-CM | POA: Insufficient documentation

## 2020-05-30 DIAGNOSIS — C50911 Malignant neoplasm of unspecified site of right female breast: Secondary | ICD-10-CM

## 2020-05-30 NOTE — Progress Notes (Signed)
[place]  Telephone:([phone]) [phone] Fax:(336) [phone]    ID: Barbara Parks DOB: [birth year]  MR#: [id number]  JJO#:[id number]  Patient Care Team: [name], MD as PCP - General (Family Medicine) Jerline Pain, MD (Cardiology) Gaye Pollack, MD (Cardiothoracic Surgery) Elih Mooney, Virgie Dad, MD as Consulting Physician (Oncology) OTHER MD: Dr Erroll Luna- Merlene Laughter, DDS; Gaynelle Arabian MD   CHIEF COMPLAINT: metastatic breast cancer, formerly on BOLERO study (s/p right mastectomy)  CURRENT TREATMENT: Fulvestrant, palbociclib, denosumab/Xgeva   INTERVAL HISTORY: Barbara Parks returns today for follow-up and treatment of her metastatic breast cancer.   She continues on fulvestrant, which was last received on 05/04/2020.  She tolerates this with no side effects that she is aware of. So far she has had no negative experiences relating to the actual administration  She also continues on palbociclib.  She takes this at bedtime as it makes her a little bit sleepy.  Otherwise she has no immediate side effects from this medication  She also continues on denosumab/Xgeva, which was last received on 05/04/2020. She just saw her dentist in September and everything was fine there. She says her bone aches feel a lot better after each Xgeva dose  She is up to date on mammography, most recently on 04/13/2020. Of course she just had restaging studies in September as well. Those were reviewed previously.  REVIEW OF SYSTEMS: Epiphany continues to play golf, mow her lawn, and recently visited [place] with her dog and took a lot of photos. Overall she is very stable and her review of systems.   COVID 19 VACCINATION STATUS:    BREAST CANCER HISTORY: From Dr. Collier Salina Parks's original intake note 04/13/2004:  "This woman has been in good health all of her life. She recently moved from [place] to work here.  She palpated a mass at about the 12 o'clock position in mid-July. She has not  noticed any nipple retraction or skin changes.  She was seen by her primary care doctor who subsequently referred her for a mammogram.  Mammogram was performed on 03/01/04. This demonstrated a spiculated 2 cm mass at the 12 o'clock position in the right breast.  Upper outer quadrant of the left breast shows some distortion.   Physical exam at that time showed a firm, nontender nodule at the 12 o'clock position in the right breast, 5 cm from the nipple.  Ultrasound of this area showed a hypoechoic ill-defined mass, measuring 2.2 x 1.3 x 1.4 cm.  Physical exam of the left breast showed general vague thickening, upper outer quadrant of the left breast with a discrete palpable mass. The ultrasound performed showed a single hypoechoic ill-defined nodule at the 1 o'clock position, measuring 7 x 5 x 6 mm.    She had biopsies of both lesions on 03/02/03.  Needle core biopsy of the lesion on the right breast revealed invasive mammary carcinoma.  Needle core biopsy of the left breast showed a complex fibroadenoma.  Prognostic panel of the lesion on the right breast showed it to be ER positive at 73%, PR positive at 90% and proliferative index 9%, HER-2 was 1+.  Patient was referred to Dr. Annamaria Boots, who performed a simple mastectomy with sentinel lymph node evaluation on 03/22/04.  Final pathology showed this to be an invasive ductal carcinoma  with lobular features, measuring 2.2 cm, grade 2 of 3.  Margins negative for carcinoma.  Invasive ductal carcinoma was extended to involve deep dermis of the nipple.  Lymphovascular invasion was  identified.  Total of 4 sentinel lymph nodes were evaluated.  Touch imprints at the time of the OR was felt to be negative.  Subsequent evaluation showed a 5 mm focus of metastatic carcinoma in one of the four lymph nodes on microscopic after sectioning.  There was extracapsular extension  of one lymph node as well. The remaining three lymph nodes were all negative."  The patient's subsequent  history is detailed above.   PAST MEDICAL HISTORY: Past Medical History:  Diagnosis Date  . Arthritis    left hip  . Asthma   . breast ca 2005   breast/chemo R mastectomy  . Dermatitis   . Diabetes mellitus without complication ([place])   . GERD (gastroesophageal reflux disease)   . Hypercholesteremia   . Metastasis to lung ([place]) dx'd 08/2011  . Osteopenia due to cancer therapy 09/09/2013  . Palpitations   . Personal history of chemotherapy   . Shortness of breath     PAST SURGICAL HISTORY: Past Surgical History:  Procedure Laterality Date  . BREAST SURGERY  2005   right  . CATARACT EXTRACTION W/PHACO Left 01/18/2014   Procedure: CATARACT EXTRACTION PHACO AND INTRAOCULAR LENS PLACEMENT (IOC);  Surgeon: Elta Guadeloupe T. Gershon Crane, MD;  Location: [facility];  Service: Ophthalmology;  Laterality: Left;  CDE 15.79  . CATARACT EXTRACTION W/PHACO Right 02/08/2014   Procedure: CATARACT EXTRACTION PHACO AND INTRAOCULAR LENS PLACEMENT (IOC);  Surgeon: Elta Guadeloupe T. Gershon Crane, MD;  Location: [facility];  Service: Ophthalmology;  Laterality: Right;  CDE 4.41  . CHEST TUBE INSERTION  09/11/2011   Procedure: INSERTION PLEURAL DRAINAGE CATHETER;  Surgeon: Gaye Pollack, MD;  Location: [facility];  Service: Thoracic;  Laterality: Right;  . MASTECTOMY Right   . PERICARDIAL WINDOW  09/11/2011   Procedure: PERICARDIAL WINDOW;  Surgeon: Gaye Pollack, MD;  Location: [facility] OR;  Service: Thoracic;  Laterality: N/A;  . REMOVAL OF PLEURAL DRAINAGE CATHETER  12/19/2011   Procedure: REMOVAL OF PLEURAL DRAINAGE CATHETER;  Surgeon: Gaye Pollack, MD;  Location: [facility];  Service: Thoracic;  Laterality: Right;  TO BE DONE IN MINOR ROOM, SHORT STAY  . [place]  . TOTAL HIP ARTHROPLASTY Left 06/07/2015   Procedure: LEFT TOTAL HIP ARTHROPLASTY ANTERIOR APPROACH;  Surgeon: Gaynelle Arabian, MD;  Location: [facility];  Service: Orthopedics;  Laterality: Left;  Marland Kitchen VIDEO BRONCHOSCOPY  09/11/2011   Procedure: VIDEO BRONCHOSCOPY;  Surgeon: Gaye Pollack, MD;  Location[facility];  Service: Thoracic;  Laterality: N/A;    FAMILY HISTORY Family History  Problem Relation Age of Onset  . Cancer Mother        breast  . Heart disease Father   . Diabetes Other   . Anesthesia problems Neg Hx    The patient's mother was diagnosed with breast cancer at age 85, she died age 3 from congestive heart failure.The patient's father died from heart disease at age 79.  She has one sister alive & well.  Two brothers alive & well, one with diabetes. The patient's sister was also diagnosed with breast cancer, and was tested for the BRCA gene, and was negative. The patient herself has not been tested. There is no history of ovarian cancer in the family   GYNECOLOGIC HISTORY:  No LMP recorded. Patient is postmenopausal. Menarche age 89, the patient is GX P0. She stopped having periods with chemotherapy in 2005. 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 She was a Archivist. She is single, lives alone with her Shitzu-poodle Sammie.. Family is all in the [place] area.     ADVANCED DIRECTIVES: Not in place. At the 02/23/2014 visit the patient was given the appropriate documents to complete and notarize at her discretion. She tells me she is planning to name her sister, Billie Ruddy, as healthcare power of attorney. [name] can be reached at [phone]   HEALTH MAINTENANCE: Social History   Tobacco Use  . Smoking status: Former Smoker    Packs/day: 1.50    Years: 30.00    Pack years: 45.00    Types: Cigarettes    Quit date: 09/10/2007    Years since quitting: 12.7  . Smokeless tobacco: Never Used  Substance Use Topics  . Alcohol use: No  . Drug use: No    Colonoscopy:  PAP:  Bone density: 04/09/2018, T score -1.8  Lipid panel:  Allergies  Allergen Reactions  . Aspirin     REACTION: upset stomach  . Latex Other (See  Comments)    Blistering and skin peels off  . Nsaids Nausea And Vomiting    Extreme nausea and vomiting    Current Outpatient Medications  Medication Sig Dispense Refill  . cholecalciferol (VITAMIN D3) 25 MCG (1000 UT) tablet Take 2 tablets (2,000 Units total) by mouth daily. 180 tablet 4  . cyclobenzaprine (FLEXERIL) 10 MG tablet Take 1 tablet (10 mg total) by mouth 2 (two) times daily as needed for muscle spasms. 20 tablet 0  . gemfibrozil (LOPID) 600 MG tablet TAKE 1 TABLET (600 MG TOTAL) BY MOUTH 2 (TWO) TIMES DAILY BEFORE A MEAL. 180 tablet 1  . loperamide (IMODIUM) 2 MG capsule Take 2 mg by mouth 4 (four) times daily as needed for diarrhea or loose stools. Reported on 11/30/2015    . metFORMIN (GLUCOPHAGE-XR) 500 MG 24 hr tablet Take 1 tablet (500 mg total) by mouth 3 (three) times daily before meals.  3  . palbociclib (IBRANCE) 125 MG tablet Take 1 tablet (125 mg total) by mouth daily. Take for 21 days on, 7 days off, repeat every 28 days. (Patient not taking: Reported on 04/05/2020) 21 tablet 6  . traMADol (ULTRAM) 50 MG tablet Take 0.5-1 tablets (25-50 mg total) by mouth every 6 (six) hours as needed. (Patient not taking: Reported on 04/05/2020) 30 tablet 0   No current facility-administered medications for this visit.    OBJECTIVE: White woman who appears stated age  35:   05/30/20 1542  BP: 133/73  Pulse: (!) 118  Resp: 18  Temp: 97.6 F (36.4 C)  SpO2: 90%   Wt Readings from Last 3 Encounters:  05/30/20 154 lb 14.4 oz (70.3 kg)  05/04/20 154 lb 9.6 oz (70.1 kg)  04/05/20 153 lb 9.6 oz (69.7 kg)   Body mass index is 26.18 kg/m.    ECOG FS:1 - Symptomatic but completely ambulatory  Sclerae unicteric, EOMs intact Wearing a mask No cervical or supraclavicular adenopathy Lungs no rales or rhonchi Heart regular rate and rhythm Abd soft, nontender, positive bowel sounds MSK no focal spinal tenderness, no upper extremity lymphedema Neuro: nonfocal, well oriented,  appropriate affect Breasts: The right breast has undergone mastectomy. There is no evidence of chest wall recurrence. The left breast is benign. Both axillae are benign.   LAB  RESULTS:  CMP     Component Value Date/Time   NA 137 05/04/2020 1224  NA 139 07/08/2017 0942   K 4.3 05/04/2020 1224   K 3.8 07/08/2017 0942   CL 106 05/04/2020 1224   CL 109 (H) 12/31/2012 0940   CO2 24 05/04/2020 1224   CO2 18 (L) 07/08/2017 0942   GLUCOSE 117 (H) 05/04/2020 1224   GLUCOSE 156 (H) 07/08/2017 0942   GLUCOSE 127 (H) 12/31/2012 0940   BUN 27 (H) 05/04/2020 1224   BUN 17.7 07/08/2017 0942   CREATININE 1.23 (H) 05/04/2020 1224   CREATININE 1.18 (H) 12/25/2017 0959   CREATININE 1.0 07/08/2017 0942   CALCIUM 10.0 05/04/2020 1224   CALCIUM 10.4 07/08/2017 0942   PROT 7.7 05/04/2020 1224   PROT 7.7 07/08/2017 0942   ALBUMIN 3.5 05/04/2020 1224   ALBUMIN 3.4 (L) 07/08/2017 0942   AST 15 05/04/2020 1224   AST 50 (H) 12/25/2017 0959   AST 54 (H) 07/08/2017 0942   ALT 15 05/04/2020 1224   ALT 53 12/25/2017 0959   ALT 39 07/08/2017 0942   ALKPHOS 95 05/04/2020 1224   ALKPHOS 232 (H) 07/08/2017 0942   BILITOT 0.3 05/04/2020 1224   BILITOT 0.3 12/25/2017 0959   BILITOT 0.42 07/08/2017 0942   GFRNONAA 45 (L) 05/04/2020 1224   GFRNONAA 47 (L) 12/25/2017 0959   GFRAA 49 (L) 04/05/2020 1504   GFRAA 54 (L) 12/25/2017 0959    I No results found for: SPEP  Lab Results  Component Value Date   WBC 5.4 05/04/2020   NEUTROABS 3.8 05/04/2020   HGB 14.3 05/04/2020   HCT 41.4 05/04/2020   MCV 102.0 (H) 05/04/2020   PLT 184 05/04/2020      Chemistry      Component Value Date/Time   NA 137 05/04/2020 1224   NA 139 07/08/2017 0942   K 4.3 05/04/2020 1224   K 3.8 07/08/2017 0942   CL 106 05/04/2020 1224   CL 109 (H) 12/31/2012 0940   CO2 24 05/04/2020 1224   CO2 18 (L) 07/08/2017 0942   BUN 27 (H) 05/04/2020 1224   BUN 17.7 07/08/2017 0942   CREATININE 1.23 (H) 05/04/2020 1224    CREATININE 1.18 (H) 12/25/2017 0959   CREATININE 1.0 07/08/2017 0942      Component Value Date/Time   CALCIUM 10.0 05/04/2020 1224   CALCIUM 10.4 07/08/2017 0942   ALKPHOS 95 05/04/2020 1224   ALKPHOS 232 (H) 07/08/2017 0942   AST 15 05/04/2020 1224   AST 50 (H) 12/25/2017 0959   AST 54 (H) 07/08/2017 0942   ALT 15 05/04/2020 1224   ALT 53 12/25/2017 0959   ALT 39 07/08/2017 0942   BILITOT 0.3 05/04/2020 1224   BILITOT 0.3 12/25/2017 0959   BILITOT 0.42 07/08/2017 0942       Lab Results  Component Value Date   LABCA2 58 (H) 09/14/2012    No components found for: [id number]  No results for input(s): INR in the last 168 hours.  Urinalysis    Component Value Date/Time   COLORURINE YELLOW 11/25/2017 0901   APPEARANCEUR HAZY (A) 11/25/2017 0901   LABSPEC 1.017 11/25/2017 0901   LABSPEC 1.030 07/13/2015 0841   PHURINE 5.0 11/25/2017 0901   GLUCOSEU NEGATIVE 11/25/2017 0901   GLUCOSEU Negative 07/13/2015 0841   HGBUR MODERATE (A) 11/25/2017 0901   BILIRUBINUR NEGATIVE 11/25/2017 0901   BILIRUBINUR Negative 07/13/2015 0841   KETONESUR NEGATIVE 11/25/2017 0901   PROTEINUR 100 (A) 11/25/2017 0901   UROBILINOGEN 0.2 07/13/2015 0841   NITRITE NEGATIVE 11/25/2017 0901   LEUKOCYTESUR NEGATIVE  11/25/2017 0901   LEUKOCYTESUR Negative 07/13/2015 0841    STUDIES: No results found.   ASSESSMENT: 69 y.o. [place], [place] woman with stage IV breast cancer, on BOLERO-4 trial  (1) status post right mastectomy and sentinel lymph node sampling 03/22/2004 for a right upper-outer quadrant pT2 pN1, stage IIB invasive ductal carcinoma with lobular features, grade 2, estrogen receptor and progesterone receptor positive, HER-2 negative, with an MIB-1 of 9% ([id number] and [id number])  (2) addition all right axillary lymph node sampling 05/07/2004 showed 2 benign lymph nodes (4 lymph nodes previously removed, so total was one positive lymph node out of 6; [id number])  (3) the patient was evaluated by  radiation oncology; no postmastectomy radiation recommended  (4) adjuvant chemotherapy with dose dense doxorubicin and cyclophosphamide x4 cycles (first cycle delayed one week) followed by dose dense paclitaxel x4 was completed 09/18/2004  (5) tamoxifen started March 2006, discontinued 2009  METASTATIC DISEASE: February 2013 (6) presenting with a large pericardial effusion, large right pleural effusion and possible right middle lobe bronchial obstruction February 2013, status post pericardial window placement, fiberoptic bronchoscopy and right Pleurx placement 09/11/2011, with biopsy of the bronchus intermedius and pericardium positive for metastatic breast cancer, estrogen receptor 91% positive with moderate staining intensity, progesterone receptor 100% positive with strong staining intensity, with an MIB-1 of 35%, and no HER-2 amplification, the signals ratio being 1.37 (SZA [id number])  (7) enrolled in BOLERO-4 trial 10/10/2011, receiving letrozole and everolimus  (a) two small areas of enhancement in the cerebellum noted by brain MRI 10/03/2011 were no longer apparent on repeat MRI 08/21/2012-- most recent brain MRI 04/01/2014 showed no evidence of intracranial metastatic disease  (b) sclerotic lesions in left iliac bone and sacrum have not been biopsied; stable; to start zolendronate after patient updates her dental care (extraction planned)--never started  (c) RLL lung nodule, stable (rescanned 07/12/2014 and 08/11/2014) R hilar and subcarinal lymph nodes: stable  (d) CT of the chest:03/25/2018, stable  (e) CT Angio 12/12/2018 stable  (f) off BOLERO trial (trial ended) as of July 2020, but continuing on study drugs  (g) letrozole and everolimus discontinued January 2021 with disease progression  (8) additional problems:  (a) hepatic steatosis  (b) COPD/ emphysema/ asthma  (c) advanced L hip osteoarthritis, status post left total hip replacement 06/07/2015  (d) aortoiliac  atherosclerosis  (e) dental evaluation pending w possible dental extractions  (f) likely thalassemia trait  (g) hyperlipidemia  (9) Bone density concerns: DEXA scan 08/11/2013 was normal  (a) repeat DEXA scan 04/09/2018 shows a T score of -1.8, osteopenia  (10) disease monitoring:  (a) PET scan 12/22/2018 shows no hypermetabolic soft tissue metastases but multifocal bony metastasis  (b) denosumab/Xgeva started on 02/25/2019, repeated every 28 days (delayed due to dental extraction healing issues)  (c) bone scan 05/13/2019 serves as baseline study with increased activity in the right scapular, lower ribs and right femoral head (stable compared to PET scan of May 2020  (d) bone scan 07/09/2019 stable  (e) CT chest 07/09/2019 shows stable lung lesions but emerging left liver lobe 0.7 cm lesion  (f) liver MRI 08/03/2019 shows approximately 8 liver lesions, the largest measuring 1.8 cm.  (g) liver biopsy 08/06/2019 confirms metastatic breast cancer, estrogen and progesterone receptor positive, with an MIB-1 of 5% and HER-2 not amplified  (h) chest CT and bone scan 11/11/2019 essentially stable  (i) liver MRI 02/01/2020 stable to improved  (j) chest CT and bone scan 04/26/2020 stable  (11) denosumab/Xgeva begun 02/25/2019 repeated every 4  weeks  (12) fulvestrant started 08/24/2019  (a) palbociclib 125 mg daily 21 days on 7 days off started 08/25/2019   PLAN: Mycah is 8-1/2 years out from definitive diagnosis of metastatic breast cancer. Her disease is well controlled as best we can tell on her current treatment which she is tolerating well. The flight in the ointment is the trend upwards in her CA 27-29.  Neither she nor I it knows why she was scheduled to see me today and get treated tomorrow. This is very inconvenient for her as it means a long drive to her both ways. If this happens again she will contact my nurse or me directly to make sure we can get it fixed  Otherwise she will get  treated tomorrow then in 4 weeks and then in 8 weeks before the 8-week visit she will have a repeat liver MRI so we can get a good measure on the liver lesions which is our main area of measurable disease  Encounter time 25 minutes.Lurline Del, MD 05/30/20 3:59 PM Medical Oncology and Hematology [facility] [place], [place] [address] Tel. [phone]    Fax. [phone]   I, Wilburn Mylar, am acting as scribe for Dr. Virgie Dad. Yvonne Petite.  I, Lurline Del MD, have reviewed the above documentation for accuracy and completeness, and I agree with the above.   *Total Encounter Time as defined by the Centers for Medicare and Medicaid Services includes, in addition to the face-to-face time of a patient visit (documented in the note above) non-face-to-face time: obtaining and reviewing outside history, ordering and reviewing medications, tests or procedures, care coordination (communications with other health care professionals or caregivers) and documentation in the medical record.

## 2020-05-31 ENCOUNTER — Other Ambulatory Visit: Payer: Self-pay

## 2020-05-31 ENCOUNTER — Inpatient Hospital Stay: Payer: Medicare Other

## 2020-05-31 VITALS — BP 132/70 | HR 68 | Temp 98.2°F | Resp 18

## 2020-05-31 DIAGNOSIS — I313 Pericardial effusion (noninflammatory): Secondary | ICD-10-CM

## 2020-05-31 DIAGNOSIS — C50911 Malignant neoplasm of unspecified site of right female breast: Secondary | ICD-10-CM

## 2020-05-31 DIAGNOSIS — Z170 Estrogen receptor positive status [ER+]: Secondary | ICD-10-CM

## 2020-05-31 DIAGNOSIS — C7951 Secondary malignant neoplasm of bone: Secondary | ICD-10-CM

## 2020-05-31 DIAGNOSIS — C787 Secondary malignant neoplasm of liver and intrahepatic bile duct: Secondary | ICD-10-CM

## 2020-05-31 DIAGNOSIS — C7800 Secondary malignant neoplasm of unspecified lung: Secondary | ICD-10-CM

## 2020-05-31 DIAGNOSIS — C50411 Malignant neoplasm of upper-outer quadrant of right female breast: Secondary | ICD-10-CM | POA: Diagnosis not present

## 2020-05-31 DIAGNOSIS — I3131 Malignant pericardial effusion in diseases classified elsewhere: Secondary | ICD-10-CM

## 2020-05-31 DIAGNOSIS — Z5111 Encounter for antineoplastic chemotherapy: Secondary | ICD-10-CM | POA: Diagnosis not present

## 2020-05-31 LAB — COMPREHENSIVE METABOLIC PANEL
ALT: 20 U/L (ref 0–44)
AST: 16 U/L (ref 15–41)
Albumin: 3.5 g/dL (ref 3.5–5.0)
Alkaline Phosphatase: 89 U/L (ref 38–126)
Anion gap: 8 (ref 5–15)
BUN: 33 mg/dL — ABNORMAL HIGH (ref 8–23)
CO2: 24 mmol/L (ref 22–32)
Calcium: 9.5 mg/dL (ref 8.9–10.3)
Chloride: 107 mmol/L (ref 98–111)
Creatinine, Ser: 1.39 mg/dL — ABNORMAL HIGH (ref 0.44–1.00)
GFR, Estimated: 41 mL/min — ABNORMAL LOW (ref >60)
Glucose, Bld: 108 mg/dL — ABNORMAL HIGH (ref 70–99)
Potassium: 4.2 mmol/L (ref 3.5–5.1)
Sodium: 139 mmol/L (ref 135–145)
Total Bilirubin: 0.3 mg/dL (ref 0.3–1.2)
Total Protein: 7.4 g/dL (ref 6.5–8.1)

## 2020-05-31 LAB — CBC WITH DIFFERENTIAL/PLATELET
Abs Immature Granulocytes: 0.03 10*3/uL (ref 0.00–0.07)
Basophils Absolute: 0.1 10*3/uL (ref 0.0–0.1)
Basophils Relative: 2 %
Eosinophils Absolute: 0 10*3/uL (ref 0.0–0.5)
Eosinophils Relative: 1 %
HCT: 40.1 % (ref 36.0–46.0)
Hemoglobin: 13.6 g/dL (ref 12.0–15.0)
Immature Granulocytes: 1 %
Lymphocytes Relative: 18 %
Lymphs Abs: 0.8 10*3/uL (ref 0.7–4.0)
MCH: 34.6 pg — ABNORMAL HIGH (ref 26.0–34.0)
MCHC: 33.9 g/dL (ref 30.0–36.0)
MCV: 102 fL — ABNORMAL HIGH (ref 80.0–100.0)
Monocytes Absolute: 0.5 10*3/uL (ref 0.1–1.0)
Monocytes Relative: 11 %
Neutro Abs: 3 10*3/uL (ref 1.7–7.7)
Neutrophils Relative %: 67 %
Platelets: 164 10*3/uL (ref 150–400)
RBC: 3.93 MIL/uL (ref 3.87–5.11)
RDW: 14 % (ref 11.5–15.5)
WBC: 4.4 10*3/uL (ref 4.0–10.5)
nRBC: 0 % (ref 0.0–0.2)

## 2020-05-31 MED ORDER — FULVESTRANT 250 MG/5ML IM SOLN
500.0000 mg | Freq: Once | INTRAMUSCULAR | Status: AC
  Administered 2020-05-31: 500 mg via INTRAMUSCULAR

## 2020-05-31 MED ORDER — DENOSUMAB 120 MG/1.7ML ~~LOC~~ SOLN
120.0000 mg | Freq: Once | SUBCUTANEOUS | Status: AC
  Administered 2020-05-31: 120 mg via SUBCUTANEOUS

## 2020-05-31 NOTE — Patient Instructions (Signed)
Denosumab injection What is this medicine? DENOSUMAB (den oh sue mab) slows bone breakdown. Prolia is used to treat osteoporosis in women after menopause and in men, and in people who are taking corticosteroids for 6 months or more. Xgeva is used to treat a high calcium level due to cancer and to prevent bone fractures and other bone problems caused by multiple myeloma or cancer bone metastases. Xgeva is also used to treat giant cell tumor of the bone. This medicine may be used for other purposes; ask your health care provider or pharmacist if you have questions. COMMON BRAND NAME(S): Prolia, XGEVA What should I tell my health care provider before I take this medicine? They need to know if you have any of these conditions:  dental disease  having surgery or tooth extraction  infection  kidney disease  low levels of calcium or Vitamin D in the blood  malnutrition  on hemodialysis  skin conditions or sensitivity  thyroid or parathyroid disease  an unusual reaction to denosumab, other medicines, foods, dyes, or preservatives  pregnant or trying to get pregnant  breast-feeding How should I use this medicine? This medicine is for injection under the skin. It is given by a health care professional in a hospital or clinic setting. A special MedGuide will be given to you before each treatment. Be sure to read this information carefully each time. For Prolia, talk to your pediatrician regarding the use of this medicine in children. Special care may be needed. For Xgeva, talk to your pediatrician regarding the use of this medicine in children. While this drug may be prescribed for children as young as 13 years for selected conditions, precautions do apply. Overdosage: If you think you have taken too much of this medicine contact a poison control center or emergency room at once. NOTE: This medicine is only for you. Do not share this medicine with others. What if I miss a dose? It is  important not to miss your dose. Call your doctor or health care professional if you are unable to keep an appointment. What may interact with this medicine? Do not take this medicine with any of the following medications:  other medicines containing denosumab This medicine may also interact with the following medications:  medicines that lower your chance of fighting infection  steroid medicines like prednisone or cortisone This list may not describe all possible interactions. Give your health care provider a list of all the medicines, herbs, non-prescription drugs, or dietary supplements you use. Also tell them if you smoke, drink alcohol, or use illegal drugs. Some items may interact with your medicine. What should I watch for while using this medicine? Visit your doctor or health care professional for regular checks on your progress. Your doctor or health care professional may order blood tests and other tests to see how you are doing. Call your doctor or health care professional for advice if you get a fever, chills or sore throat, or other symptoms of a cold or flu. Do not treat yourself. This drug may decrease your body's ability to fight infection. Try to avoid being around people who are sick. You should make sure you get enough calcium and vitamin D while you are taking this medicine, unless your doctor tells you not to. Discuss the foods you eat and the vitamins you take with your health care professional. See your dentist regularly. Brush and floss your teeth as directed. Before you have any dental work done, tell your dentist you are   receiving this medicine. Do not become pregnant while taking this medicine or for 5 months after stopping it. Talk with your doctor or health care professional about your birth control options while taking this medicine. Women should inform their doctor if they wish to become pregnant or think they might be pregnant. There is a potential for serious side  effects to an unborn child. Talk to your health care professional or pharmacist for more information. What side effects may I notice from receiving this medicine? Side effects that you should report to your doctor or health care professional as soon as possible:  allergic reactions like skin rash, itching or hives, swelling of the face, lips, or tongue  bone pain  breathing problems  dizziness  jaw pain, especially after dental work  redness, blistering, peeling of the skin  signs and symptoms of infection like fever or chills; cough; sore throat; pain or trouble passing urine  signs of low calcium like fast heartbeat, muscle cramps or muscle pain; pain, tingling, numbness in the hands or feet; seizures  unusual bleeding or bruising  unusually weak or tired Side effects that usually do not require medical attention (report to your doctor or health care professional if they continue or are bothersome):  constipation  diarrhea  headache  joint pain  loss of appetite  muscle pain  runny nose  tiredness  upset stomach This list may not describe all possible side effects. Call your doctor for medical advice about side effects. You may report side effects to FDA at 1-800-FDA-1088. Where should I keep my medicine? This medicine is only given in a clinic, doctor's office, or other health care setting and will not be stored at home. NOTE: This sheet is a summary. It may not cover all possible information. If you have questions about this medicine, talk to your doctor, pharmacist, or health care provider.  2020 [employer] Standard (2017-11-21 16:10:44) Fulvestrant injection What is this medicine? FULVESTRANT (ful VES trant) blocks the effects of estrogen. It is used to treat breast cancer. This medicine may be used for other purposes; ask your health care provider or pharmacist if you have questions. COMMON BRAND NAME(S): FASLODEX What should I tell my health care  provider before I take this medicine? They need to know if you have any of these conditions:  bleeding disorders  liver disease  low blood counts, like low white cell, platelet, or red cell counts  an unusual or allergic reaction to fulvestrant, other medicines, foods, dyes, or preservatives  pregnant or trying to get pregnant  breast-feeding How should I use this medicine? This medicine is for injection into a muscle. It is usually given by a health care professional in a hospital or clinic setting. Talk to your pediatrician regarding the use of this medicine in children. Special care may be needed. Overdosage: If you think you have taken too much of this medicine contact a poison control center or emergency room at once. NOTE: This medicine is only for you. Do not share this medicine with others. What if I miss a dose? It is important not to miss your dose. Call your doctor or health care professional if you are unable to keep an appointment. What may interact with this medicine?  medicines that treat or prevent blood clots like warfarin, enoxaparin, dalteparin, apixaban, dabigatran, and rivaroxaban This list may not describe all possible interactions. Give your health care provider a list of all the medicines, herbs, non-prescription drugs, or dietary supplements you use.   Also tell them if you smoke, drink alcohol, or use illegal drugs. Some items may interact with your medicine. What should I watch for while using this medicine? Your condition will be monitored carefully while you are receiving this medicine. You will need important blood work done while you are taking this medicine. Do not become pregnant while taking this medicine or for at least 1 year after stopping it. Women of child-bearing potential will need to have a negative pregnancy test before starting this medicine. Women should inform their doctor if they wish to become pregnant or think they might be pregnant. There is  a potential for serious side effects to an unborn child. Men should inform their doctors if they wish to father a child. This medicine may lower sperm counts. Talk to your health care professional or pharmacist for more information. Do not breast-feed an infant while taking this medicine or for 1 year after the last dose. What side effects may I notice from receiving this medicine? Side effects that you should report to your doctor or health care professional as soon as possible:  allergic reactions like skin rash, itching or hives, swelling of the face, lips, or tongue  feeling faint or lightheaded, falls  pain, tingling, numbness, or weakness in the legs  signs and symptoms of infection like fever or chills; cough; flu-like symptoms; sore throat  vaginal bleeding Side effects that usually do not require medical attention (report to your doctor or health care professional if they continue or are bothersome):  aches, pains  constipation  diarrhea  headache  hot flashes  nausea, vomiting  pain at site where injected  stomach pain This list may not describe all possible side effects. Call your doctor for medical advice about side effects. You may report side effects to FDA at 1-800-FDA-1088. Where should I keep my medicine? This drug is given in a hospital or clinic and will not be stored at home. NOTE: This sheet is a summary. It may not cover all possible information. If you have questions about this medicine, talk to your doctor, pharmacist, or health care provider.  2020 [employer] Standard (2017-10-23 11:34:41)  

## 2020-06-01 LAB — CANCER ANTIGEN 27.29: CA 27.29: 317.9 U/mL — ABNORMAL HIGH (ref 0.0–38.6)

## 2020-06-19 ENCOUNTER — Telehealth: Payer: Self-pay

## 2020-06-25 IMAGING — CT CT ANGIOGRAPHY CHEST
2 of 6 series · 18 of 46 positions shown · IV contrast (Isovue)
Comparison: 11/19/2018

CLINICAL DATA: Chest pain, history of right lung cancer, right
breast cancer

EXAM:
CT ANGIOGRAPHY CHEST WITH CONTRAST
TECHNIQUE: Multidetector CT imaging of the chest was performed using the
standard protocol during bolus administration of intravenous
contrast. Multiplanar CT image reconstructions and MIPs were
obtained to evaluate the vascular anatomy.
CONTRAST:  75mL OMNIPAQUE IOHEXOL 350 MG/ML SOLN

[Series 5: thins · axial · 0.68mm/px · z∈[+1284,+1585]mm · 15 of 331 slices shown]
[im 15/331  lung]
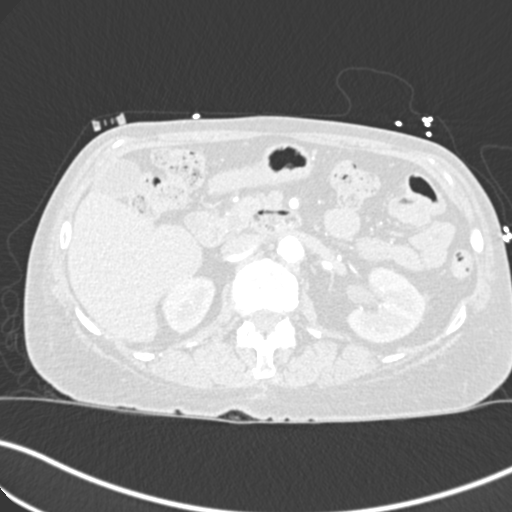
[im 44/331  soft-tissue]
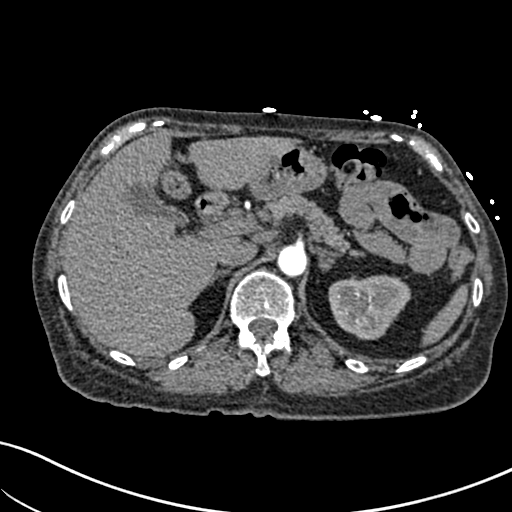
[im 58/331  lung]
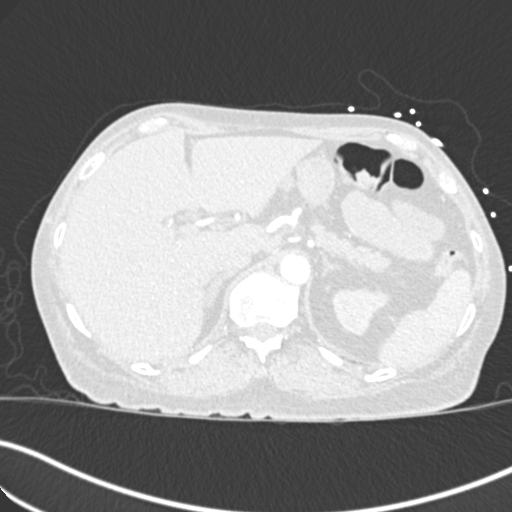
[im 87/331  soft-tissue]
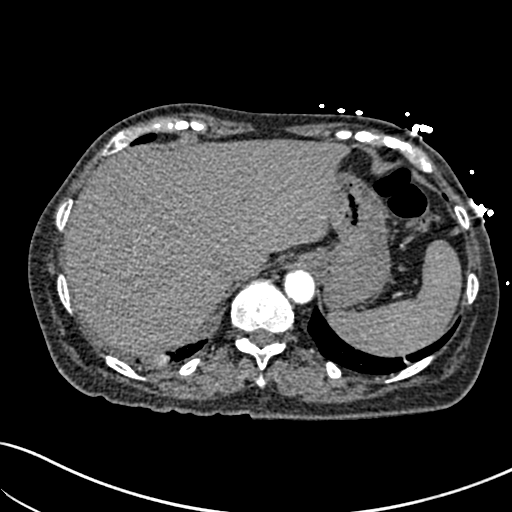
[im 101/331  lung]
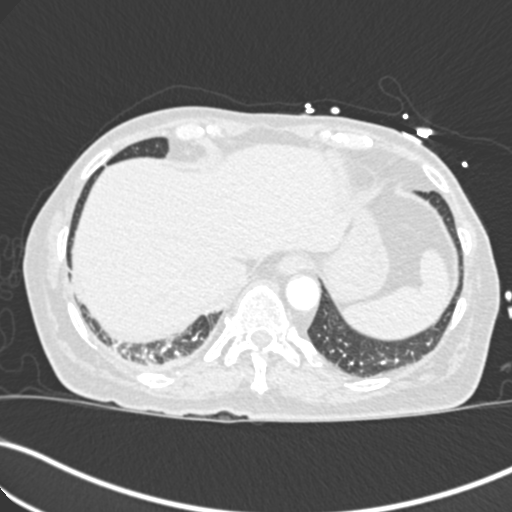
[im 130/331  soft-tissue]
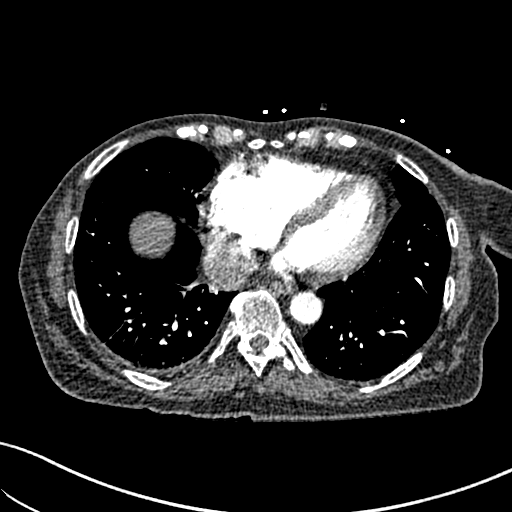
[im 144/331  lung]
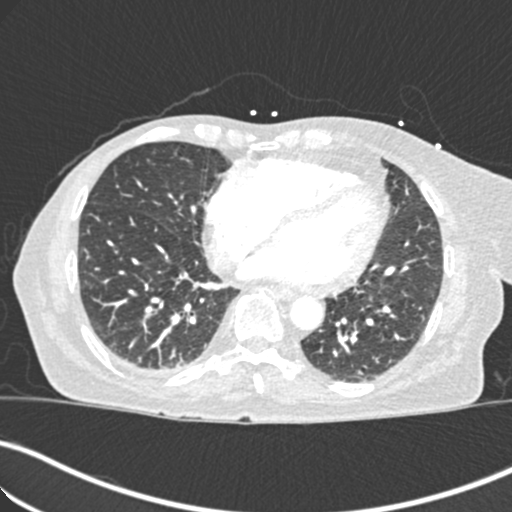
[im 173/331  soft-tissue]
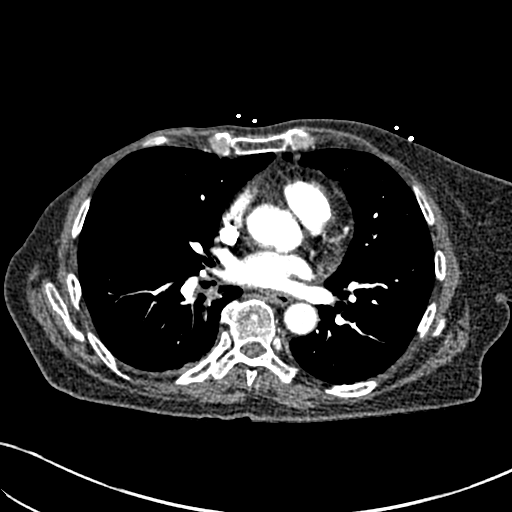
[im 187/331  lung]
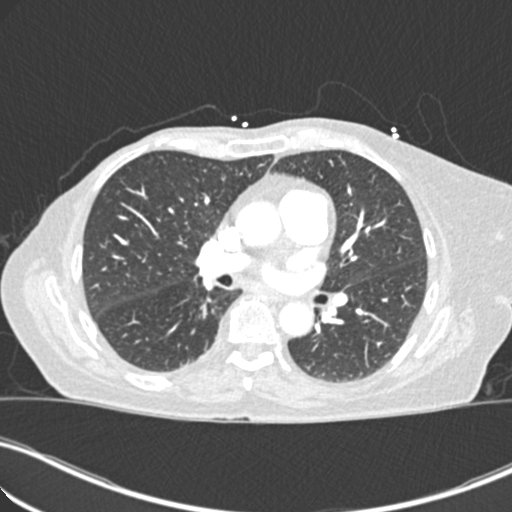
[im 201/331  soft-tissue]
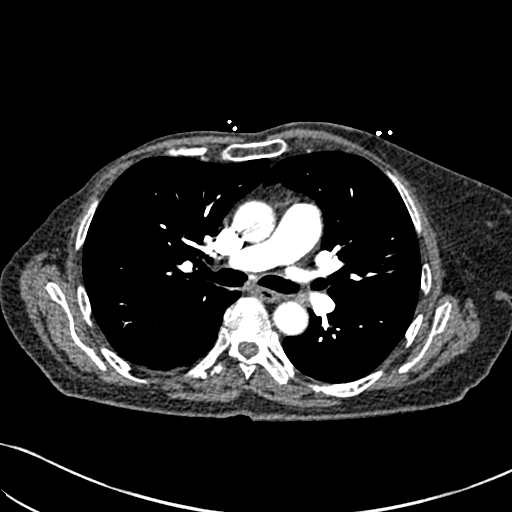
[im 230/331  lung]
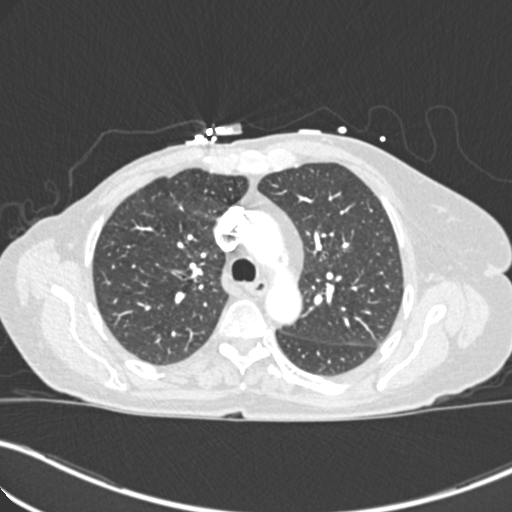
[im 244/331  soft-tissue]
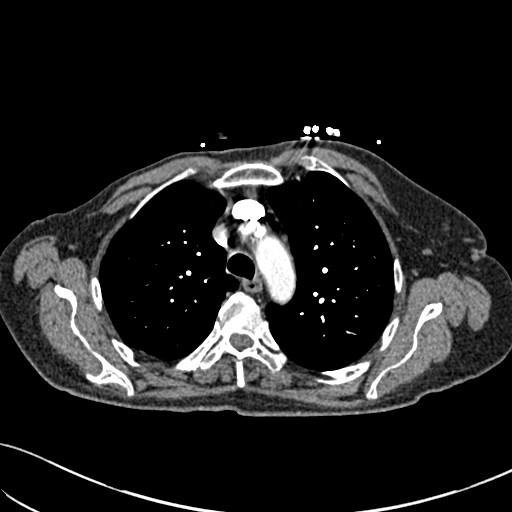
[im 273/331  lung]
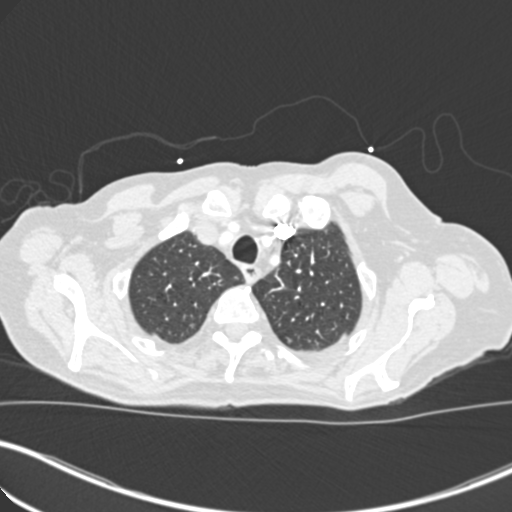
[im 287/331  soft-tissue]
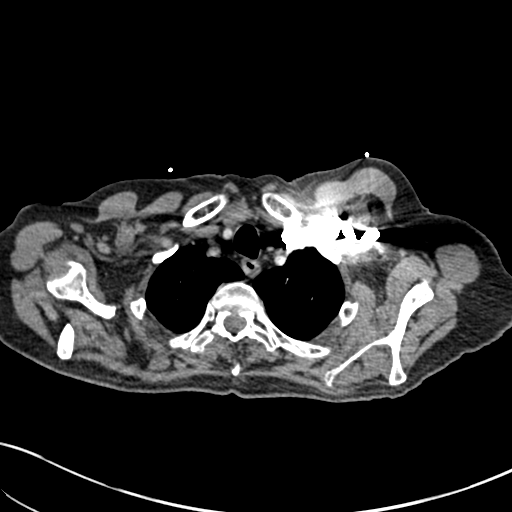
[im 316/331  lung]
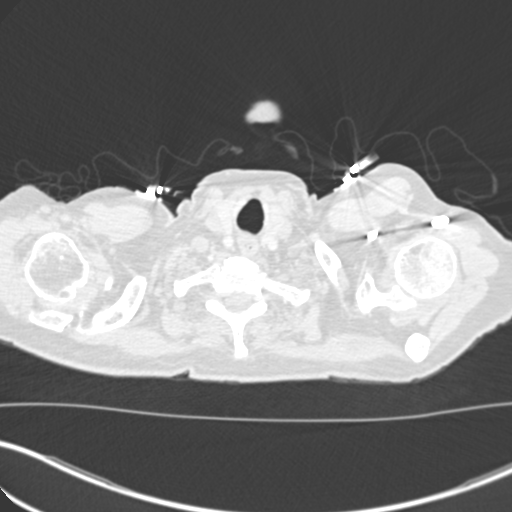

[Series 7: coronal mpr · coronal · 0.66mm/px · 3 of 114 slices shown]
[im 29/114  soft-tissue]
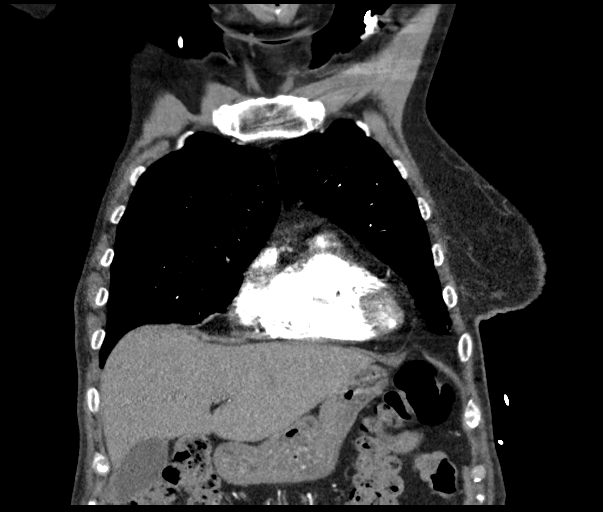
[im 57/114  soft-tissue]
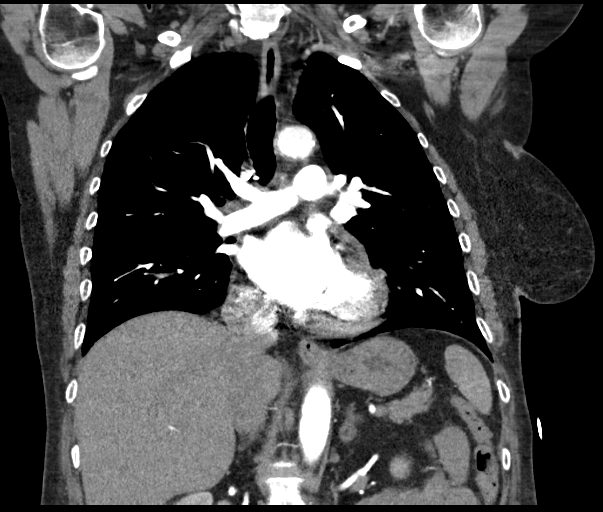
[im 85/114  soft-tissue]
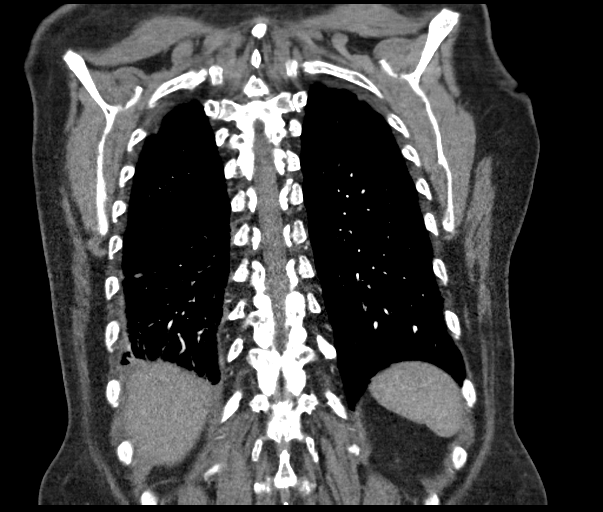

[18 of 46 positions shown; findings below may reference images not displayed]

FINDINGS: Cardiovascular: Satisfactory opacification of the pulmonary arteries
to the segmental level. No evidence of pulmonary embolism. Normal
heart size. No pericardial effusion.

Mediastinum/Nodes: No enlarged mediastinal, hilar, or axillary lymph
nodes. Thyroid gland, trachea, and esophagus demonstrate no
significant findings.

Lungs/Pleura: No change in infrahilar soft tissue of the right lung
and pulmonary nodules in the right lung base (series 6, image 120,
91). Numerous tiny, centrilobular pulmonary nodules unchanged from
prior examination. Mild emphysema. No pleural effusion or
pneumothorax.

Upper Abdomen: No acute abnormality.  Stable left adrenal nodule.

Musculoskeletal: Status post right mastectomy. No acute or
significant osseous findings.

Review of the MIP images confirms the above findings.
IMPRESSION: 1.  Negative examination for pulmonary embolism.

2.  Unchanged post treatment appearance of right lung malignancy.

3. Numerous tiny, centrilobular pulmonary nodules unchanged from
prior examination, nonspecific and likely infectious or
inflammatory.

4.  Mild emphysema.

5.  Stable left adrenal nodule.

## 2020-06-25 IMAGING — DX RIGHT RIBS AND CHEST - 3+ VIEW
4 series · 4 of 4 positions shown · non-contrast
Comparison: Chest CT - 11/19/2018

CLINICAL DATA: Right mid back pain for the past week. History of
metastatic breast cancer.

EXAM:
RIGHT RIBS AND CHEST - 3+ VIEW

[chest pa]
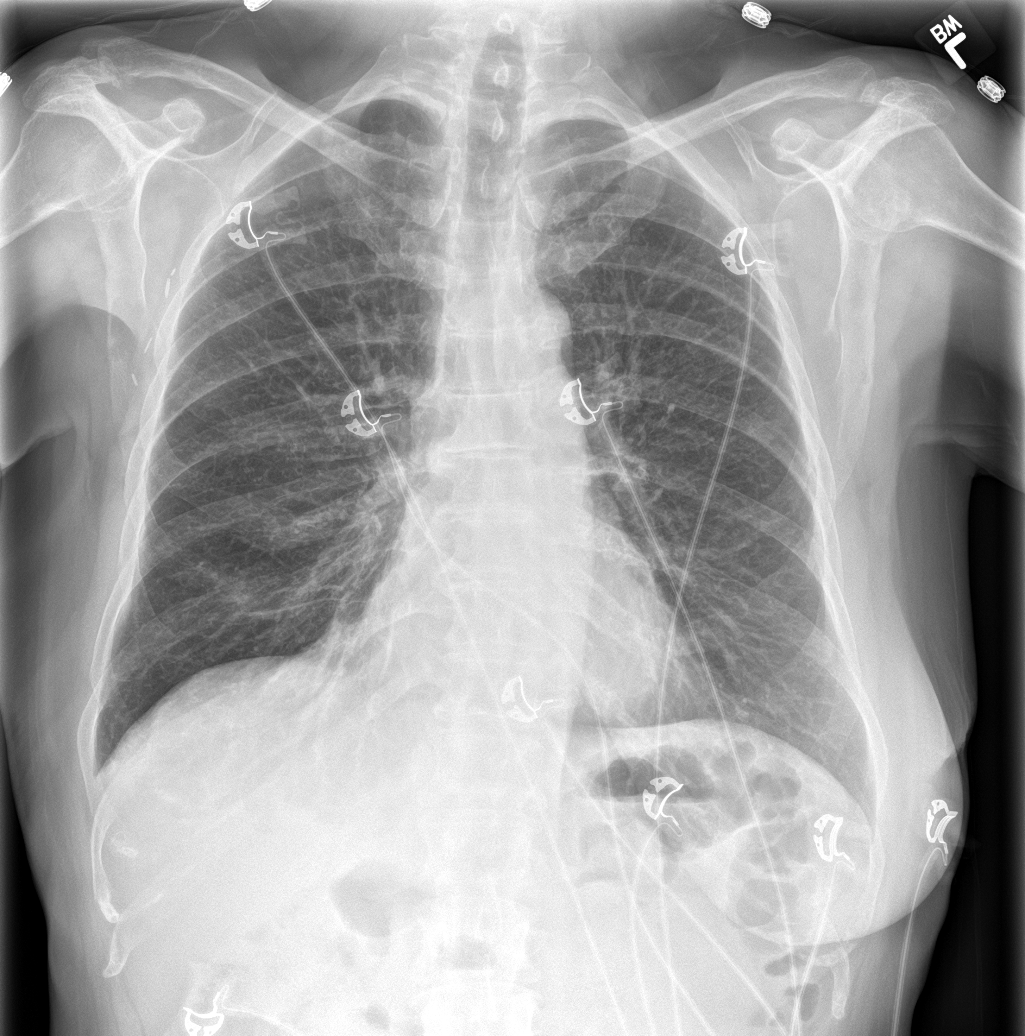

[rib pa]
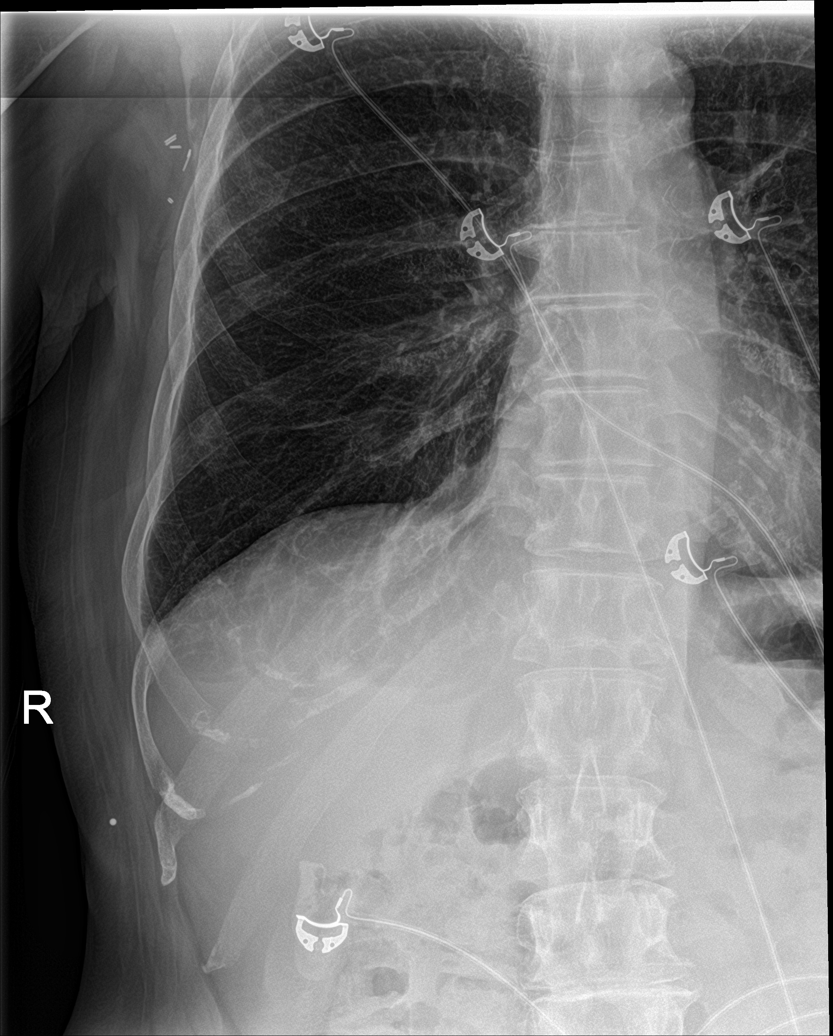

[rib pa obl (1 of 2)]
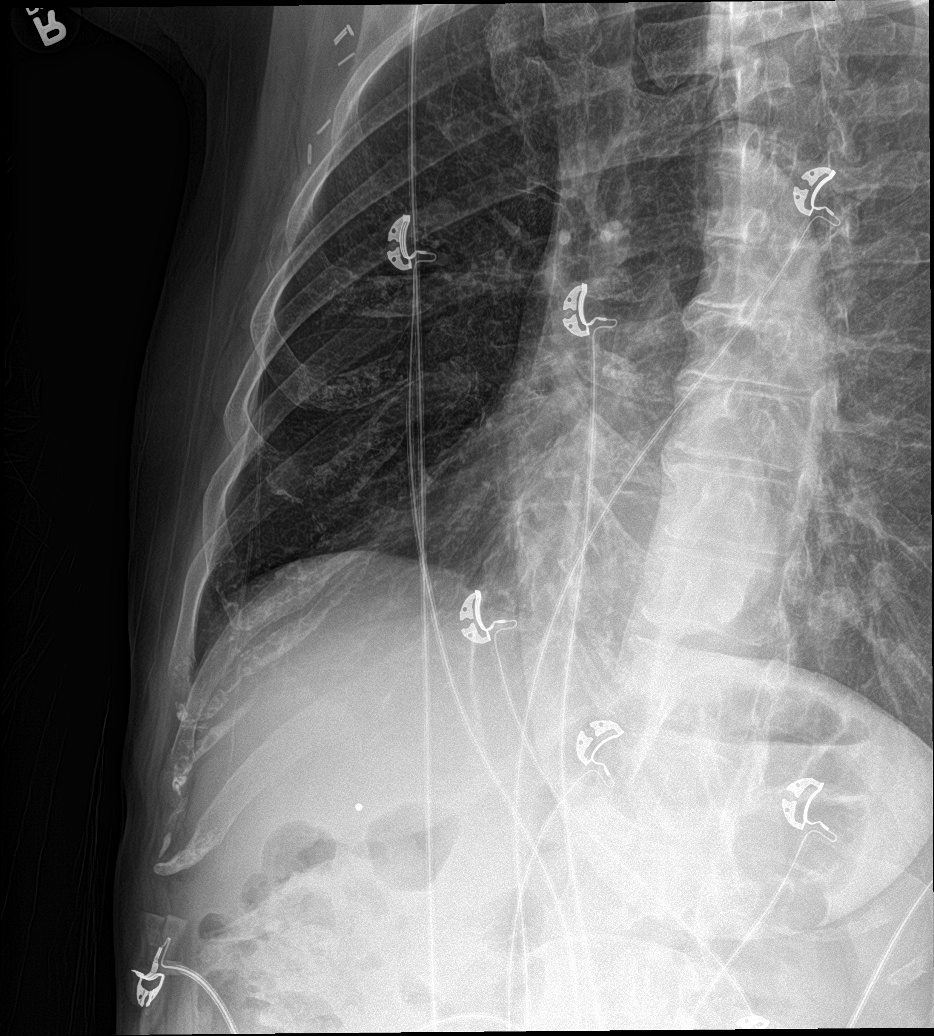

[rib pa obl (2 of 2)]
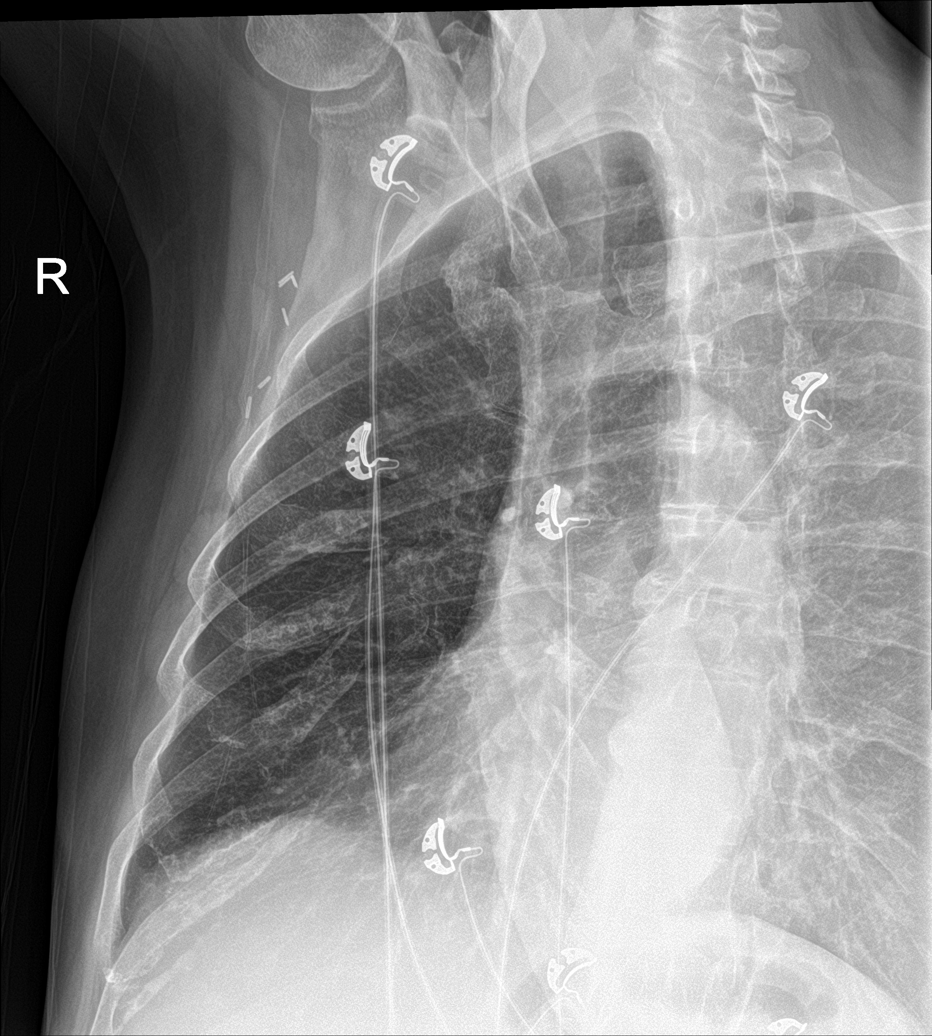

[4 of 4 positions shown; findings below may reference images not displayed]

FINDINGS: Unchanged cardiac silhouette and mediastinal contours. There is mild
diffuse slightly nodular thickening of the pulmonary interstitium.
No discrete focal airspace opacities. No pleural effusion or
pneumothorax. No evidence of edema. No acute osseous abnormalities.
Specifically, no definite displaced right-sided rib fractures with
special attention paid to the area demarcated by the radiopaque BB.
Regional soft tissues appear normal. No radiopaque foreign body.

Post right-sided mastectomy and right-sided axillary adenectomy.
Stigmata of DISH within the thoracic spine. Degenerative change of
the right AC joint is suspected though incompletely evaluated.
IMPRESSION: No explanation for patient's right mid and back pain. Specifically,
no definite displaced right-sided rib fractures.

Given history of malignancy, as well as retrospective review of
chest CT performed 11/19/2018 in which I question an approximately
1.2 x 0.8 cm lytic lesion involving the posterior aspect of the
right tenth rib (image 105, series 7), further evaluation could be
performed with the acquisition of a nuclear medicine bone scan
and/or PET scan as indicated.

## 2020-06-28 ENCOUNTER — Inpatient Hospital Stay: Payer: Medicare Other

## 2020-06-28 ENCOUNTER — Inpatient Hospital Stay: Payer: Medicare Other | Attending: Oncology

## 2020-06-28 ENCOUNTER — Other Ambulatory Visit: Payer: Self-pay

## 2020-06-28 VITALS — BP 134/80 | HR 98 | Temp 98.0°F | Resp 18

## 2020-06-28 DIAGNOSIS — C50411 Malignant neoplasm of upper-outer quadrant of right female breast: Secondary | ICD-10-CM | POA: Insufficient documentation

## 2020-06-28 DIAGNOSIS — Z5111 Encounter for antineoplastic chemotherapy: Secondary | ICD-10-CM | POA: Insufficient documentation

## 2020-06-28 DIAGNOSIS — Z170 Estrogen receptor positive status [ER+]: Secondary | ICD-10-CM

## 2020-06-28 DIAGNOSIS — C50911 Malignant neoplasm of unspecified site of right female breast: Secondary | ICD-10-CM

## 2020-06-28 DIAGNOSIS — I313 Pericardial effusion (noninflammatory): Secondary | ICD-10-CM

## 2020-06-28 DIAGNOSIS — I3131 Malignant pericardial effusion in diseases classified elsewhere: Secondary | ICD-10-CM

## 2020-06-28 DIAGNOSIS — C7951 Secondary malignant neoplasm of bone: Secondary | ICD-10-CM | POA: Diagnosis not present

## 2020-06-28 DIAGNOSIS — C787 Secondary malignant neoplasm of liver and intrahepatic bile duct: Secondary | ICD-10-CM

## 2020-06-28 LAB — COMPREHENSIVE METABOLIC PANEL
ALT: 10 U/L (ref 0–44)
AST: 13 U/L — ABNORMAL LOW (ref 15–41)
Albumin: 3.6 g/dL (ref 3.5–5.0)
Alkaline Phosphatase: 87 U/L (ref 38–126)
Anion gap: 10 (ref 5–15)
BUN: 29 mg/dL — ABNORMAL HIGH (ref 8–23)
CO2: 22 mmol/L (ref 22–32)
Calcium: 10.3 mg/dL (ref 8.9–10.3)
Chloride: 106 mmol/L (ref 98–111)
Creatinine, Ser: 1.35 mg/dL — ABNORMAL HIGH (ref 0.44–1.00)
GFR, Estimated: 43 mL/min — ABNORMAL LOW (ref >60)
Glucose, Bld: 107 mg/dL — ABNORMAL HIGH (ref 70–99)
Potassium: 4.6 mmol/L (ref 3.5–5.1)
Sodium: 138 mmol/L (ref 135–145)
Total Bilirubin: 0.3 mg/dL (ref 0.3–1.2)
Total Protein: 7.7 g/dL (ref 6.5–8.1)

## 2020-06-28 LAB — CBC WITH DIFFERENTIAL/PLATELET
Abs Immature Granulocytes: 0.02 10*3/uL (ref 0.00–0.07)
Basophils Absolute: 0.1 10*3/uL (ref 0.0–0.1)
Basophils Relative: 1 %
Eosinophils Absolute: 0 10*3/uL (ref 0.0–0.5)
Eosinophils Relative: 1 %
HCT: 41.9 % (ref 36.0–46.0)
Hemoglobin: 13.7 g/dL (ref 12.0–15.0)
Immature Granulocytes: 0 %
Lymphocytes Relative: 17 %
Lymphs Abs: 1 10*3/uL (ref 0.7–4.0)
MCH: 34.4 pg — ABNORMAL HIGH (ref 26.0–34.0)
MCHC: 32.7 g/dL (ref 30.0–36.0)
MCV: 105.3 fL — ABNORMAL HIGH (ref 80.0–100.0)
Monocytes Absolute: 0.5 10*3/uL (ref 0.1–1.0)
Monocytes Relative: 8 %
Neutro Abs: 4.1 10*3/uL (ref 1.7–7.7)
Neutrophils Relative %: 73 %
Platelets: 185 10*3/uL (ref 150–400)
RBC: 3.98 MIL/uL (ref 3.87–5.11)
RDW: 13.9 % (ref 11.5–15.5)
WBC: 5.6 10*3/uL (ref 4.0–10.5)
nRBC: 0 % (ref 0.0–0.2)

## 2020-06-28 MED ORDER — DENOSUMAB 120 MG/1.7ML ~~LOC~~ SOLN
SUBCUTANEOUS | Status: AC
  Filled 2020-06-28: qty 1.7

## 2020-06-28 MED ORDER — FULVESTRANT 250 MG/5ML IM SOLN
INTRAMUSCULAR | Status: AC
  Filled 2020-06-28: qty 10

## 2020-06-28 MED ORDER — FULVESTRANT 250 MG/5ML IM SOLN
500.0000 mg | Freq: Once | INTRAMUSCULAR | Status: AC
  Administered 2020-06-28: 500 mg via INTRAMUSCULAR

## 2020-06-28 MED ORDER — DENOSUMAB 120 MG/1.7ML ~~LOC~~ SOLN
120.0000 mg | Freq: Once | SUBCUTANEOUS | Status: AC
  Administered 2020-06-28: 120 mg via SUBCUTANEOUS

## 2020-06-28 NOTE — Patient Instructions (Signed)
Denosumab injection °What is this medicine? °DENOSUMAB (den oh sue mab) slows bone breakdown. Prolia is used to treat osteoporosis in women after menopause and in men, and in people who are taking corticosteroids for 6 months or more. Xgeva is used to treat a high calcium level due to cancer and to prevent bone fractures and other bone problems caused by multiple myeloma or cancer bone metastases. Xgeva is also used to treat giant cell tumor of the bone. °This medicine may be used for other purposes; ask your health care provider or pharmacist if you have questions. °COMMON BRAND NAME(S): Prolia, XGEVA °What should I tell my health care provider before I take this medicine? °They need to know if you have any of these conditions: °· dental disease °· having surgery or tooth extraction °· infection °· kidney disease °· low levels of calcium or Vitamin D in the blood °· malnutrition °· on hemodialysis °· skin conditions or sensitivity °· thyroid or parathyroid disease °· an unusual reaction to denosumab, other medicines, foods, dyes, or preservatives °· pregnant or trying to get pregnant °· breast-feeding °How should I use this medicine? °This medicine is for injection under the skin. It is given by a health care professional in a hospital or clinic setting. °A special MedGuide will be given to you before each treatment. Be sure to read this information carefully each time. °For Prolia, talk to your pediatrician regarding the use of this medicine in children. Special care may be needed. For Xgeva, talk to your pediatrician regarding the use of this medicine in children. While this drug may be prescribed for children as young as 13 years for selected conditions, precautions do apply. °Overdosage: If you think you have taken too much of this medicine contact a poison control center or emergency room at once. °NOTE: This medicine is only for you. Do not share this medicine with others. °What if I miss a dose? °It is  important not to miss your dose. Call your doctor or health care professional if you are unable to keep an appointment. °What may interact with this medicine? °Do not take this medicine with any of the following medications: °· other medicines containing denosumab °This medicine may also interact with the following medications: °· medicines that lower your chance of fighting infection °· steroid medicines like prednisone or cortisone °This list may not describe all possible interactions. Give your health care provider a list of all the medicines, herbs, non-prescription drugs, or dietary supplements you use. Also tell them if you smoke, drink alcohol, or use illegal drugs. Some items may interact with your medicine. °What should I watch for while using this medicine? °Visit your doctor or health care professional for regular checks on your progress. Your doctor or health care professional may order blood tests and other tests to see how you are doing. °Call your doctor or health care professional for advice if you get a fever, chills or sore throat, or other symptoms of a cold or flu. Do not treat yourself. This drug may decrease your body's ability to fight infection. Try to avoid being around people who are sick. °You should make sure you get enough calcium and vitamin D while you are taking this medicine, unless your doctor tells you not to. Discuss the foods you eat and the vitamins you take with your health care professional. °See your dentist regularly. Brush and floss your teeth as directed. Before you have any dental work done, tell your dentist you are   receiving this medicine. Do not become pregnant while taking this medicine or for 5 months after stopping it. Talk with your doctor or health care professional about your birth control options while taking this medicine. Women should inform their doctor if they wish to become pregnant or think they might be pregnant. There is a potential for serious side  effects to an unborn child. Talk to your health care professional or pharmacist for more information. What side effects may I notice from receiving this medicine? Side effects that you should report to your doctor or health care professional as soon as possible:  allergic reactions like skin rash, itching or hives, swelling of the face, lips, or tongue  bone pain  breathing problems  dizziness  jaw pain, especially after dental work  redness, blistering, peeling of the skin  signs and symptoms of infection like fever or chills; cough; sore throat; pain or trouble passing urine  signs of low calcium like fast heartbeat, muscle cramps or muscle pain; pain, tingling, numbness in the hands or feet; seizures  unusual bleeding or bruising  unusually weak or tired Side effects that usually do not require medical attention (report to your doctor or health care professional if they continue or are bothersome):  constipation  diarrhea  headache  joint pain  loss of appetite  muscle pain  runny nose  tiredness  upset stomach This list may not describe all possible side effects. Call your doctor for medical advice about side effects. You may report side effects to FDA at 1-800-FDA-1088. Where should I keep my medicine? This medicine is only given in a clinic, doctor's office, or other health care setting and will not be stored at home. NOTE: This sheet is a summary. It may not cover all possible information. If you have questions about this medicine, talk to your doctor, pharmacist, or health care provider.  2020 Elsevier/Gold Standard (2017-11-21 16:10:44) Fulvestrant injection What is this medicine? FULVESTRANT (ful VES trant) blocks the effects of estrogen. It is used to treat breast cancer. This medicine may be used for other purposes; ask your health care provider or pharmacist if you have questions. COMMON BRAND NAME(S): FASLODEX What should I tell my health care  provider before I take this medicine? They need to know if you have any of these conditions:  bleeding disorders  liver disease  low blood counts, like low white cell, platelet, or red cell counts  an unusual or allergic reaction to fulvestrant, other medicines, foods, dyes, or preservatives  pregnant or trying to get pregnant  breast-feeding How should I use this medicine? This medicine is for injection into a muscle. It is usually given by a health care professional in a hospital or clinic setting. Talk to your pediatrician regarding the use of this medicine in children. Special care may be needed. Overdosage: If you think you have taken too much of this medicine contact a poison control center or emergency room at once. NOTE: This medicine is only for you. Do not share this medicine with others. What if I miss a dose? It is important not to miss your dose. Call your doctor or health care professional if you are unable to keep an appointment. What may interact with this medicine?  medicines that treat or prevent blood clots like warfarin, enoxaparin, dalteparin, apixaban, dabigatran, and rivaroxaban This list may not describe all possible interactions. Give your health care provider a list of all the medicines, herbs, non-prescription drugs, or dietary supplements you use.   Also tell them if you smoke, drink alcohol, or use illegal drugs. Some items may interact with your medicine. What should I watch for while using this medicine? Your condition will be monitored carefully while you are receiving this medicine. You will need important blood work done while you are taking this medicine. Do not become pregnant while taking this medicine or for at least 1 year after stopping it. Women of child-bearing potential will need to have a negative pregnancy test before starting this medicine. Women should inform their doctor if they wish to become pregnant or think they might be pregnant. There is  a potential for serious side effects to an unborn child. Men should inform their doctors if they wish to father a child. This medicine may lower sperm counts. Talk to your health care professional or pharmacist for more information. Do not breast-feed an infant while taking this medicine or for 1 year after the last dose. What side effects may I notice from receiving this medicine? Side effects that you should report to your doctor or health care professional as soon as possible:  allergic reactions like skin rash, itching or hives, swelling of the face, lips, or tongue  feeling faint or lightheaded, falls  pain, tingling, numbness, or weakness in the legs  signs and symptoms of infection like fever or chills; cough; flu-like symptoms; sore throat  vaginal bleeding Side effects that usually do not require medical attention (report to your doctor or health care professional if they continue or are bothersome):  aches, pains  constipation  diarrhea  headache  hot flashes  nausea, vomiting  pain at site where injected  stomach pain This list may not describe all possible side effects. Call your doctor for medical advice about side effects. You may report side effects to FDA at 1-800-FDA-1088. Where should I keep my medicine? This drug is given in a hospital or clinic and will not be stored at home. NOTE: This sheet is a summary. It may not cover all possible information. If you have questions about this medicine, talk to your doctor, pharmacist, or health care provider.  2020 Elsevier/Gold Standard (2017-10-23 11:34:41)  

## 2020-06-29 LAB — CANCER ANTIGEN 27.29: CA 27.29: 326.4 U/mL — ABNORMAL HIGH (ref 0.0–38.6)

## 2020-07-04 NOTE — Telephone Encounter (Signed)
Oral Oncology Patient Advocate Encounter  Met patient in [place] to complete re-enrollment application for [facility] in an effort to reduce patient's out of pocket expense for [facility] to $0.    Application completed and faxed to [phone].   [employer] patient assistance phone number for follow up is [phone].   This encounter will be updated until final determination.   [place] Patient [place] Phone [phone] Fax [phone] 07/04/2020 3:22 PM

## 2020-07-05 ENCOUNTER — Other Ambulatory Visit: Payer: Self-pay | Admitting: *Deleted

## 2020-07-05 DIAGNOSIS — C50911 Malignant neoplasm of unspecified site of right female breast: Secondary | ICD-10-CM

## 2020-07-05 DIAGNOSIS — C50411 Malignant neoplasm of upper-outer quadrant of right female breast: Secondary | ICD-10-CM

## 2020-07-05 DIAGNOSIS — C787 Secondary malignant neoplasm of liver and intrahepatic bile duct: Secondary | ICD-10-CM

## 2020-07-05 DIAGNOSIS — Z170 Estrogen receptor positive status [ER+]: Secondary | ICD-10-CM

## 2020-07-12 ENCOUNTER — Telehealth: Payer: Self-pay | Admitting: *Deleted

## 2020-07-12 ENCOUNTER — Other Ambulatory Visit: Payer: Self-pay | Admitting: *Deleted

## 2020-07-12 NOTE — Telephone Encounter (Signed)
This RN attempted to return call per pt's VM inquiring about scheduled scans for CT's and MRI's.  Noted pt is scheduled for CT of the chest,abdomen and pelvis on 12/27 per visit with MD on 10/7  MRI of liver is scheduled for 12/28 per visit 11/2.  This RN obtained VM- requested a return call to this RN to clarify above.  Per MD - ideally pt needs MRI of her liver and CT of the chest.   Noted neither scan request shows as authorized.  This RN sent note to managed care per above.  General VM left for pt to return call for further discussion.

## 2020-07-24 ENCOUNTER — Encounter (HOSPITAL_COMMUNITY): Payer: Self-pay

## 2020-07-24 ENCOUNTER — Ambulatory Visit (HOSPITAL_COMMUNITY): Payer: Medicare Other

## 2020-07-24 ENCOUNTER — Ambulatory Visit (HOSPITAL_COMMUNITY)
Admission: RE | Admit: 2020-07-24 | Discharge: 2020-07-24 | Disposition: A | Discharge status: Home / Self Care | Payer: Medicare Other | Source: Ambulatory Visit | Attending: Oncology | Admitting: Oncology

## 2020-07-24 ENCOUNTER — Other Ambulatory Visit (HOSPITAL_COMMUNITY): Payer: Medicare Other

## 2020-07-24 ENCOUNTER — Other Ambulatory Visit: Payer: Self-pay

## 2020-07-24 DIAGNOSIS — I313 Pericardial effusion (noninflammatory): Secondary | ICD-10-CM | POA: Diagnosis not present

## 2020-07-24 DIAGNOSIS — J90 Pleural effusion, not elsewhere classified: Secondary | ICD-10-CM | POA: Diagnosis not present

## 2020-07-24 DIAGNOSIS — C50411 Malignant neoplasm of upper-outer quadrant of right female breast: Secondary | ICD-10-CM | POA: Insufficient documentation

## 2020-07-24 DIAGNOSIS — Z170 Estrogen receptor positive status [ER+]: Secondary | ICD-10-CM | POA: Insufficient documentation

## 2020-07-24 DIAGNOSIS — C7800 Secondary malignant neoplasm of unspecified lung: Secondary | ICD-10-CM | POA: Diagnosis not present

## 2020-07-24 DIAGNOSIS — C801 Malignant (primary) neoplasm, unspecified: Secondary | ICD-10-CM | POA: Diagnosis not present

## 2020-07-24 DIAGNOSIS — C50911 Malignant neoplasm of unspecified site of right female breast: Secondary | ICD-10-CM | POA: Diagnosis not present

## 2020-07-24 DIAGNOSIS — C787 Secondary malignant neoplasm of liver and intrahepatic bile duct: Secondary | ICD-10-CM | POA: Diagnosis not present

## 2020-07-24 DIAGNOSIS — I3131 Malignant pericardial effusion in diseases classified elsewhere: Secondary | ICD-10-CM

## 2020-07-24 DIAGNOSIS — C7951 Secondary malignant neoplasm of bone: Secondary | ICD-10-CM | POA: Diagnosis not present

## 2020-07-24 DIAGNOSIS — J9809 Other diseases of bronchus, not elsewhere classified: Secondary | ICD-10-CM | POA: Diagnosis not present

## 2020-07-24 MED ORDER — IOHEXOL 9 MG/ML PO SOLN: 1000.0000 mL | ORAL | Status: DC

## 2020-07-24 MED ORDER — IOHEXOL 300 MG/ML  SOLN
75.0000 mL | Freq: Once | INTRAMUSCULAR | Status: AC | PRN
  Administered 2020-07-24: 10:00:00 60 mL via INTRAVENOUS

## 2020-07-25 ENCOUNTER — Ambulatory Visit (HOSPITAL_COMMUNITY)
Admission: RE | Admit: 2020-07-25 | Discharge: 2020-07-25 | Disposition: A | Discharge status: Home / Self Care | Payer: Medicare Other | Source: Ambulatory Visit | Attending: Oncology | Admitting: Oncology

## 2020-07-25 ENCOUNTER — Other Ambulatory Visit (HOSPITAL_COMMUNITY): Payer: Medicare Other

## 2020-07-25 DIAGNOSIS — C50919 Malignant neoplasm of unspecified site of unspecified female breast: Secondary | ICD-10-CM | POA: Diagnosis not present

## 2020-07-25 DIAGNOSIS — C787 Secondary malignant neoplasm of liver and intrahepatic bile duct: Secondary | ICD-10-CM | POA: Diagnosis not present

## 2020-07-25 DIAGNOSIS — Z8505 Personal history of malignant neoplasm of liver: Secondary | ICD-10-CM | POA: Diagnosis not present

## 2020-07-25 DIAGNOSIS — C7800 Secondary malignant neoplasm of unspecified lung: Secondary | ICD-10-CM | POA: Diagnosis not present

## 2020-07-25 DIAGNOSIS — C50411 Malignant neoplasm of upper-outer quadrant of right female breast: Secondary | ICD-10-CM | POA: Insufficient documentation

## 2020-07-25 DIAGNOSIS — D3500 Benign neoplasm of unspecified adrenal gland: Secondary | ICD-10-CM | POA: Diagnosis not present

## 2020-07-25 DIAGNOSIS — C50911 Malignant neoplasm of unspecified site of right female breast: Secondary | ICD-10-CM | POA: Diagnosis not present

## 2020-07-25 DIAGNOSIS — Z170 Estrogen receptor positive status [ER+]: Secondary | ICD-10-CM | POA: Diagnosis not present

## 2020-07-25 MED ORDER — GADOBUTROL 1 MMOL/ML IV SOLN
7.0000 mL | Freq: Once | INTRAVENOUS | Status: AC | PRN
  Administered 2020-07-25: 14:00:00 7 mL via INTRAVENOUS

## 2020-07-26 ENCOUNTER — Inpatient Hospital Stay: Payer: Medicare Other

## 2020-07-26 ENCOUNTER — Inpatient Hospital Stay (HOSPITAL_BASED_OUTPATIENT_CLINIC_OR_DEPARTMENT_OTHER): Payer: Medicare Other | Admitting: Adult Health

## 2020-07-26 ENCOUNTER — Ambulatory Visit: Payer: Medicare Other

## 2020-07-26 ENCOUNTER — Other Ambulatory Visit: Payer: Medicare Other

## 2020-07-26 ENCOUNTER — Other Ambulatory Visit: Payer: Self-pay

## 2020-07-26 ENCOUNTER — Encounter: Payer: Self-pay | Admitting: Adult Health

## 2020-07-26 VITALS — BP 114/56 | HR 98 | Temp 97.9°F | Resp 18 | Ht 64.5 in | Wt 159.6 lb

## 2020-07-26 DIAGNOSIS — C50911 Malignant neoplasm of unspecified site of right female breast: Secondary | ICD-10-CM

## 2020-07-26 DIAGNOSIS — I3131 Malignant pericardial effusion in diseases classified elsewhere: Secondary | ICD-10-CM

## 2020-07-26 DIAGNOSIS — C7951 Secondary malignant neoplasm of bone: Secondary | ICD-10-CM | POA: Diagnosis not present

## 2020-07-26 DIAGNOSIS — C787 Secondary malignant neoplasm of liver and intrahepatic bile duct: Secondary | ICD-10-CM

## 2020-07-26 DIAGNOSIS — Z170 Estrogen receptor positive status [ER+]: Secondary | ICD-10-CM

## 2020-07-26 DIAGNOSIS — C7800 Secondary malignant neoplasm of unspecified lung: Secondary | ICD-10-CM | POA: Diagnosis not present

## 2020-07-26 DIAGNOSIS — C50411 Malignant neoplasm of upper-outer quadrant of right female breast: Secondary | ICD-10-CM

## 2020-07-26 DIAGNOSIS — Z5111 Encounter for antineoplastic chemotherapy: Secondary | ICD-10-CM | POA: Diagnosis not present

## 2020-07-26 LAB — CBC WITH DIFFERENTIAL/PLATELET
Abs Immature Granulocytes: 0.02 10*3/uL (ref 0.00–0.07)
Basophils Absolute: 0.1 10*3/uL (ref 0.0–0.1)
Basophils Relative: 2 %
Eosinophils Absolute: 0 10*3/uL (ref 0.0–0.5)
Eosinophils Relative: 1 %
HCT: 40.8 % (ref 36.0–46.0)
Hemoglobin: 13.9 g/dL (ref 12.0–15.0)
Immature Granulocytes: 0 %
Lymphocytes Relative: 17 %
Lymphs Abs: 0.8 10*3/uL (ref 0.7–4.0)
MCH: 35.6 pg — ABNORMAL HIGH (ref 26.0–34.0)
MCHC: 34.1 g/dL (ref 30.0–36.0)
MCV: 104.6 fL — ABNORMAL HIGH (ref 80.0–100.0)
Monocytes Absolute: 0.5 10*3/uL (ref 0.1–1.0)
Monocytes Relative: 11 %
Neutro Abs: 3.5 10*3/uL (ref 1.7–7.7)
Neutrophils Relative %: 69 %
Platelets: 201 10*3/uL (ref 150–400)
RBC: 3.9 MIL/uL (ref 3.87–5.11)
RDW: 13.5 % (ref 11.5–15.5)
WBC: 5 10*3/uL (ref 4.0–10.5)
nRBC: 0 % (ref 0.0–0.2)

## 2020-07-26 LAB — COMPREHENSIVE METABOLIC PANEL
ALT: 13 U/L (ref 0–44)
AST: 14 U/L — ABNORMAL LOW (ref 15–41)
Albumin: 3.5 g/dL (ref 3.5–5.0)
Alkaline Phosphatase: 79 U/L (ref 38–126)
Anion gap: 7 (ref 5–15)
BUN: 21 mg/dL (ref 8–23)
CO2: 25 mmol/L (ref 22–32)
Calcium: 9.7 mg/dL (ref 8.9–10.3)
Chloride: 109 mmol/L (ref 98–111)
Creatinine, Ser: 1.32 mg/dL — ABNORMAL HIGH (ref 0.44–1.00)
GFR, Estimated: 44 mL/min — ABNORMAL LOW (ref >60)
Glucose, Bld: 103 mg/dL — ABNORMAL HIGH (ref 70–99)
Potassium: 4.6 mmol/L (ref 3.5–5.1)
Sodium: 141 mmol/L (ref 135–145)
Total Bilirubin: 0.3 mg/dL (ref 0.3–1.2)
Total Protein: 7.7 g/dL (ref 6.5–8.1)

## 2020-07-26 MED ORDER — FULVESTRANT 250 MG/5ML IM SOLN
INTRAMUSCULAR | Status: AC
  Filled 2020-07-26: qty 10

## 2020-07-26 MED ORDER — DENOSUMAB 120 MG/1.7ML ~~LOC~~ SOLN
SUBCUTANEOUS | Status: AC
  Filled 2020-07-26: qty 1.7

## 2020-07-26 MED ORDER — DENOSUMAB 120 MG/1.7ML ~~LOC~~ SOLN
120.0000 mg | Freq: Once | SUBCUTANEOUS | Status: AC
  Administered 2020-07-26: 15:00:00 120 mg via SUBCUTANEOUS

## 2020-07-26 MED ORDER — FULVESTRANT 250 MG/5ML IM SOLN
500.0000 mg | Freq: Once | INTRAMUSCULAR | Status: AC
  Administered 2020-07-26: 14:00:00 500 mg via INTRAMUSCULAR

## 2020-07-26 NOTE — Patient Instructions (Signed)
Denosumab injection What is this medicine? DENOSUMAB (den oh sue mab) slows bone breakdown. Prolia is used to treat osteoporosis in women after menopause and in men, and in people who are taking corticosteroids for 6 months or more. Xgeva is used to treat a high calcium level due to cancer and to prevent bone fractures and other bone problems caused by multiple myeloma or cancer bone metastases. Xgeva is also used to treat giant cell tumor of the bone. This medicine may be used for other purposes; ask your health care provider or pharmacist if you have questions. COMMON BRAND NAME(S): Prolia, XGEVA What should I tell my health care provider before I take this medicine? They need to know if you have any of these conditions:  dental disease  having surgery or tooth extraction  infection  kidney disease  low levels of calcium or Vitamin D in the blood  malnutrition  on hemodialysis  skin conditions or sensitivity  thyroid or parathyroid disease  an unusual reaction to denosumab, other medicines, foods, dyes, or preservatives  pregnant or trying to get pregnant  breast-feeding How should I use this medicine? This medicine is for injection under the skin. It is given by a health care professional in a hospital or clinic setting. A special MedGuide will be given to you before each treatment. Be sure to read this information carefully each time. For Prolia, talk to your pediatrician regarding the use of this medicine in children. Special care may be needed. For Xgeva, talk to your pediatrician regarding the use of this medicine in children. While this drug may be prescribed for children as young as 13 years for selected conditions, precautions do apply. Overdosage: If you think you have taken too much of this medicine contact a poison control center or emergency room at once. NOTE: This medicine is only for you. Do not share this medicine with others. What if I miss a dose? It is  important not to miss your dose. Call your doctor or health care professional if you are unable to keep an appointment. What may interact with this medicine? Do not take this medicine with any of the following medications:  other medicines containing denosumab This medicine may also interact with the following medications:  medicines that lower your chance of fighting infection  steroid medicines like prednisone or cortisone This list may not describe all possible interactions. Give your health care provider a list of all the medicines, herbs, non-prescription drugs, or dietary supplements you use. Also tell them if you smoke, drink alcohol, or use illegal drugs. Some items may interact with your medicine. What should I watch for while using this medicine? Visit your doctor or health care professional for regular checks on your progress. Your doctor or health care professional may order blood tests and other tests to see how you are doing. Call your doctor or health care professional for advice if you get a fever, chills or sore throat, or other symptoms of a cold or flu. Do not treat yourself. This drug may decrease your body's ability to fight infection. Try to avoid being around people who are sick. You should make sure you get enough calcium and vitamin D while you are taking this medicine, unless your doctor tells you not to. Discuss the foods you eat and the vitamins you take with your health care professional. See your dentist regularly. Brush and floss your teeth as directed. Before you have any dental work done, tell your dentist you are   receiving this medicine. Do not become pregnant while taking this medicine or for 5 months after stopping it. Talk with your doctor or health care professional about your birth control options while taking this medicine. Women should inform their doctor if they wish to become pregnant or think they might be pregnant. There is a potential for serious side  effects to an unborn child. Talk to your health care professional or pharmacist for more information. What side effects may I notice from receiving this medicine? Side effects that you should report to your doctor or health care professional as soon as possible:  allergic reactions like skin rash, itching or hives, swelling of the face, lips, or tongue  bone pain  breathing problems  dizziness  jaw pain, especially after dental work  redness, blistering, peeling of the skin  signs and symptoms of infection like fever or chills; cough; sore throat; pain or trouble passing urine  signs of low calcium like fast heartbeat, muscle cramps or muscle pain; pain, tingling, numbness in the hands or feet; seizures  unusual bleeding or bruising  unusually weak or tired Side effects that usually do not require medical attention (report to your doctor or health care professional if they continue or are bothersome):  constipation  diarrhea  headache  joint pain  loss of appetite  muscle pain  runny nose  tiredness  upset stomach This list may not describe all possible side effects. Call your doctor for medical advice about side effects. You may report side effects to FDA at 1-800-FDA-1088. Where should I keep my medicine? This medicine is only given in a clinic, doctor's office, or other health care setting and will not be stored at home. NOTE: This sheet is a summary. It may not cover all possible information. If you have questions about this medicine, talk to your doctor, pharmacist, or health care provider.  2020 [employer] Standard (2017-11-21 16:10:44) Fulvestrant injection What is this medicine? FULVESTRANT (ful VES trant) blocks the effects of estrogen. It is used to treat breast cancer. This medicine may be used for other purposes; ask your health care provider or pharmacist if you have questions. COMMON BRAND NAME(S): FASLODEX What should I tell my health care  provider before I take this medicine? They need to know if you have any of these conditions:  bleeding disorders  liver disease  low blood counts, like low white cell, platelet, or red cell counts  an unusual or allergic reaction to fulvestrant, other medicines, foods, dyes, or preservatives  pregnant or trying to get pregnant  breast-feeding How should I use this medicine? This medicine is for injection into a muscle. It is usually given by a health care professional in a hospital or clinic setting. Talk to your pediatrician regarding the use of this medicine in children. Special care may be needed. Overdosage: If you think you have taken too much of this medicine contact a poison control center or emergency room at once. NOTE: This medicine is only for you. Do not share this medicine with others. What if I miss a dose? It is important not to miss your dose. Call your doctor or health care professional if you are unable to keep an appointment. What may interact with this medicine?  medicines that treat or prevent blood clots like warfarin, enoxaparin, dalteparin, apixaban, dabigatran, and rivaroxaban This list may not describe all possible interactions. Give your health care provider a list of all the medicines, herbs, non-prescription drugs, or dietary supplements you use.   Also tell them if you smoke, drink alcohol, or use illegal drugs. Some items may interact with your medicine. What should I watch for while using this medicine? Your condition will be monitored carefully while you are receiving this medicine. You will need important blood work done while you are taking this medicine. Do not become pregnant while taking this medicine or for at least 1 year after stopping it. Women of child-bearing potential will need to have a negative pregnancy test before starting this medicine. Women should inform their doctor if they wish to become pregnant or think they might be pregnant. There is  a potential for serious side effects to an unborn child. Men should inform their doctors if they wish to father a child. This medicine may lower sperm counts. Talk to your health care professional or pharmacist for more information. Do not breast-feed an infant while taking this medicine or for 1 year after the last dose. What side effects may I notice from receiving this medicine? Side effects that you should report to your doctor or health care professional as soon as possible:  allergic reactions like skin rash, itching or hives, swelling of the face, lips, or tongue  feeling faint or lightheaded, falls  pain, tingling, numbness, or weakness in the legs  signs and symptoms of infection like fever or chills; cough; flu-like symptoms; sore throat  vaginal bleeding Side effects that usually do not require medical attention (report to your doctor or health care professional if they continue or are bothersome):  aches, pains  constipation  diarrhea  headache  hot flashes  nausea, vomiting  pain at site where injected  stomach pain This list may not describe all possible side effects. Call your doctor for medical advice about side effects. You may report side effects to FDA at 1-800-FDA-1088. Where should I keep my medicine? This drug is given in a hospital or clinic and will not be stored at home. NOTE: This sheet is a summary. It may not cover all possible information. If you have questions about this medicine, talk to your doctor, pharmacist, or health care provider.  2020 [employer] Standard (2017-10-23 11:34:41)  

## 2020-07-26 NOTE — Progress Notes (Signed)
Barbara Parks  [phone] [phone]    ID: Barbara Parks DOB: [birth year]  MR#: [id number]  OFH#:[id number]  Patient Care Team: [name], MD as PCP - General (Family Medicine) Jerline Pain, MD (Cardiology) Gaye Pollack, MD (Cardiothoracic Surgery) Magrinat, Virgie Dad, MD as Consulting Physician (Oncology) OTHER MD: Dr Erroll Luna- Merlene Laughter, DDS; Gaynelle Arabian MD   CHIEF COMPLAINT: metastatic breast cancer, formerly on BOLERO study (s/p right mastectomy)  CURRENT TREATMENT: Fulvestrant, palbociclib, denosumab/Xgeva   INTERVAL HISTORY: Barbara Parks returns today for follow-up and treatment of her metastatic breast cancer.   She continues on fulvestrant every 4 weeks and is tolerating this quite well.  She is also receiving xgeva every 4 weeks with no issues.  She has no dental concners today.    She also continues on palbociclib 125 mg 3 weeks on and 1 week off.  She is tolerating this well and notes she has one more week until her off week.    Barbara Parks underwent an MRI of her liver yesterday that showed mild progression in three areas by 2-12m.  She had a CT chest on 12/27 that showed no disease progression.    She is up to date on mammography, most recently on 04/13/2020. Of course she just had restaging studies in September as well. Those were reviewed previously.  REVIEW OF SYSTEMS: LVerleencontinues to do well.  She continues to play golf and plans on doing this again on Friday.  She denies any new pain, fever, chills, nausea, vomiting, bowel/bladder changes headaches, or any other concerns.  A detailed ROS was otherwise non contributory.  COVID 19 VACCINATION STATUS:    BREAST CANCER HISTORY: From Dr. PCollier SalinaRubin's original intake note 04/13/2004:  "This woman has been in good health all of her life. [name].  She palpated a mass at about the 12 o'clock position in mid-July. She has not noticed any  nipple retraction or skin changes.  She was seen by her primary care doctor who subsequently referred her for a mammogram.  Mammogram was performed on 03/01/04. This demonstrated a spiculated 2 cm mass at the 12 o'clock position in the right breast.  Upper outer quadrant of the left breast shows some distortion.   Physical exam at that time showed a firm, nontender nodule at the 12 o'clock position in the right breast, 5 cm from the nipple.  Ultrasound of this area showed a hypoechoic ill-defined mass, measuring 2.2 x 1.3 x 1.4 cm.  Physical exam of the left breast showed general vague thickening, upper outer quadrant of the left breast with a discrete palpable mass. The ultrasound performed showed a single hypoechoic ill-defined nodule at the 1 o'clock position, measuring 7 x 5 x 6 mm.    She had biopsies of both lesions on 03/02/03.  Needle core biopsy of the lesion on the right breast revealed invasive mammary carcinoma.  Needle core biopsy of the left breast showed a complex fibroadenoma.  Prognostic panel of the lesion on the right breast showed it to be ER positive at 73%, PR positive at 90% and proliferative index 9%, HER-2 was 1+.  Patient was referred to Dr. YAnnamaria Boots who performed a simple mastectomy with sentinel lymph node evaluation on 03/22/04.  Final pathology showed this to be an invasive ductal carcinoma  with lobular features, measuring 2.2 cm, grade 2 of 3.  Margins negative for carcinoma.  Invasive ductal carcinoma was extended to involve deep  dermis of the nipple.  Lymphovascular invasion was identified.  Total of 4 sentinel lymph nodes were evaluated.  Touch imprints at the time of the OR was felt to be negative.  Subsequent evaluation showed a 5 mm focus of metastatic carcinoma in one of the four lymph nodes on microscopic after sectioning.  There was extracapsular extension  of one lymph node as well. The remaining three lymph nodes were all negative."  The patient's subsequent history is  detailed above.   PAST MEDICAL HISTORY: Past Medical History:  Diagnosis Date  . Arthritis    left hip  . Asthma   . breast ca 2005   breast/chemo R mastectomy  . Dermatitis   . Diabetes mellitus without complication ([place])   . GERD (gastroesophageal reflux disease)   . Hypercholesteremia   . Metastasis to lung ([place]) dx'd 08/2011  . Osteopenia due to cancer therapy 09/09/2013  . Palpitations   . Personal history of chemotherapy   . Shortness of breath     PAST SURGICAL HISTORY: Past Surgical History:  Procedure Laterality Date  . BREAST SURGERY  2005   right  . CATARACT EXTRACTION W/PHACO Left 01/18/2014   Procedure: CATARACT EXTRACTION PHACO AND INTRAOCULAR LENS PLACEMENT (IOC);  Surgeon: Elta Guadeloupe T. Gershon Crane, MD;  Location: [facility];  Service: Ophthalmology;  Laterality: Left;  CDE 15.79  . CATARACT EXTRACTION W/PHACO Right 02/08/2014   Procedure: CATARACT EXTRACTION PHACO AND INTRAOCULAR LENS PLACEMENT (IOC);  Surgeon: Elta Guadeloupe T. Gershon Crane, MD;  Location: [facility];  Service: Ophthalmology;  Laterality: Right;  CDE 4.41  . CHEST TUBE INSERTION  09/11/2011   Procedure: INSERTION PLEURAL DRAINAGE CATHETER;  Surgeon: Gaye Pollack, MD;  Location: [place];  Service: Thoracic;  Laterality: Right;  . MASTECTOMY Right   . PERICARDIAL WINDOW  09/11/2011   Procedure: PERICARDIAL WINDOW;  Surgeon: Gaye Pollack, MD;  Location[facility];  Service: Thoracic;  Laterality: N/A;  . REMOVAL OF PLEURAL DRAINAGE CATHETER  12/19/2011   Procedure: REMOVAL OF PLEURAL DRAINAGE CATHETER;  Surgeon: Gaye Pollack, MD;  Location: [place];  Service: Thoracic;  Laterality: Right;  TO BE DONE IN MINOR ROOM, SHORT STAY  . [place]  . TOTAL HIP ARTHROPLASTY Left 06/07/2015   Procedure: LEFT TOTAL HIP ARTHROPLASTY ANTERIOR APPROACH;  Surgeon: Gaynelle Arabian, MD;  Location: [facility];  Service: Orthopedics;  Laterality: Left;  Marland Kitchen VIDEO BRONCHOSCOPY  09/11/2011   Procedure: VIDEO BRONCHOSCOPY;  Surgeon: Gaye Pollack, MD;   Location[facility];  Service: Thoracic;  Laterality: N/A;    FAMILY HISTORY Family History  Problem Relation Age of Onset  . Cancer Mother        breast  . Heart disease Father   . Diabetes Other   . Anesthesia problems Neg Hx    The patient's mother was diagnosed with breast cancer at age 74, she died age 39 from congestive heart failure.The patient's father died from heart disease at age 50.  She has one sister alive & well.  Two brothers alive & well, one with diabetes. The patient's sister was also diagnosed with breast cancer, and was tested for the BRCA gene, and was negative. The patient herself has not been tested. There is no history of ovarian cancer in the family   GYNECOLOGIC HISTORY:  No LMP recorded. Patient is postmenopausal. Menarche age 48, the patient is GX P0. She stopped having periods with chemotherapy in 2005. 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 She was a Archivist. She is single, lives alone with her Shitzu-poodle Sammie.. Family is all in the [place] area.     ADVANCED DIRECTIVES: Not in place. At the 02/23/2014 visit the patient was given the appropriate documents to complete and notarize at her discretion. She tells me she is planning to name her sister, Billie Ruddy, as healthcare power of attorney. [name] can be reached at [phone]   HEALTH MAINTENANCE: Social History   Tobacco Use  . Smoking status: Former Smoker    Packs/day: 1.50    Years: 30.00    Pack years: 45.00    Types: Cigarettes    Quit date: 09/10/2007    Years since quitting: 12.8  . Smokeless tobacco: Never Used  Substance Use Topics  . Alcohol use: No  . Drug use: No    Colonoscopy:  PAP:  Bone density: 04/09/2018, T score -1.8  Lipid panel:  Allergies  Allergen Reactions  . Aspirin     REACTION: upset stomach  . Latex Other (See Comments)     Blistering and skin peels off  . Nsaids Nausea And Vomiting    Extreme nausea and vomiting    Current Outpatient Medications  Medication Sig Dispense Refill  . cholecalciferol (VITAMIN D3) 25 MCG (1000 UT) tablet Take 2 tablets (2,000 Units total) by mouth daily. 180 tablet 4  . cyclobenzaprine (FLEXERIL) 10 MG tablet Take 1 tablet (10 mg total) by mouth 2 (two) times daily as needed for muscle spasms. 20 tablet 0  . gemfibrozil (LOPID) 600 MG tablet TAKE 1 TABLET (600 MG TOTAL) BY MOUTH 2 (TWO) TIMES DAILY BEFORE A MEAL. 180 tablet 1  . loperamide (IMODIUM) 2 MG capsule Take 2 mg by mouth 4 (four) times daily as needed for diarrhea or loose stools. Reported on 11/30/2015    . metFORMIN (GLUCOPHAGE-XR) 500 MG 24 hr tablet Take 1 tablet (500 mg total) by mouth 3 (three) times daily before meals.  3  . palbociclib (IBRANCE) 125 MG tablet Take 1 tablet (125 mg total) by mouth daily. Take for 21 days on, 7 days off, repeat every 28 days. 21 tablet 6  . traMADol (ULTRAM) 50 MG tablet Take 0.5-1 tablets (25-50 mg total) by mouth every 6 (six) hours as needed. 30 tablet 0   No current facility-administered medications for this visit.   Facility-Administered Medications Ordered in Other Visits  Medication Dose Route Frequency Provider Last Rate Last Admin  . denosumab (XGEVA) injection 120 mg  120 mg Subcutaneous Once Gardenia Phlegm, NP        OBJECTIVE:  Vitals:   07/26/20 1350  BP: (!) 114/56  Pulse: 98  Resp: 18  Temp: 97.9 F (36.6 C)  SpO2: 98%   Wt Readings from Last 3 Encounters:  07/26/20 159 lb 9.6 oz (72.4 kg)  05/30/20 154 lb 14.4 oz (70.3 kg)  05/04/20 154 lb 9.6 oz (70.1 kg)   Body mass index is 26.97 kg/m.    ECOG FS:1 - Symptomatic but completely ambulatory GENERAL: Patient is a well appearing female in no acute distress HEENT:  Sclerae anicteric.  Oropharynx clear and moist. No ulcerations or evidence of oropharyngeal candidiasis. Neck is supple.  NODES:  No  cervical, supraclavicular, or axillary lymphadenopathy palpated.  BREAST EXAM:  Right breast s/p mastectomy, no sign of local recurrence, left breast benign LUNGS:  Clear to auscultation  bilaterally.  No wheezes or rhonchi. HEART:  Regular rate and rhythm. No murmur appreciated. ABDOMEN:  Soft, nontender.  Positive, normoactive bowel sounds. No organomegaly palpated. MSK:  No focal spinal tenderness to palpation. Full range of motion bilaterally in the upper extremities. EXTREMITIES:  No peripheral edema.   SKIN:  Clear with no obvious rashes or skin changes. No nail dyscrasia. NEURO:  Nonfocal. Well oriented.  Appropriate affect.     LAB  RESULTS:  CMP     Component Value Date/Time   NA 141 07/26/2020 1327   NA 139 07/08/2017 0942   K 4.6 07/26/2020 1327   K 3.8 07/08/2017 0942   CL 109 07/26/2020 1327   CL 109 (H) 12/31/2012 0940   CO2 25 07/26/2020 1327   CO2 18 (L) 07/08/2017 0942   GLUCOSE 103 (H) 07/26/2020 1327   GLUCOSE 156 (H) 07/08/2017 0942   GLUCOSE 127 (H) 12/31/2012 0940   BUN 21 07/26/2020 1327   BUN 17.7 07/08/2017 0942   CREATININE 1.32 (H) 07/26/2020 1327   CREATININE 1.18 (H) 12/25/2017 0959   CREATININE 1.0 07/08/2017 0942   CALCIUM 9.7 07/26/2020 1327   CALCIUM 10.4 07/08/2017 0942   PROT 7.7 07/26/2020 1327   PROT 7.7 07/08/2017 0942   ALBUMIN 3.5 07/26/2020 1327   ALBUMIN 3.4 (L) 07/08/2017 0942   AST 14 (L) 07/26/2020 1327   AST 50 (H) 12/25/2017 0959   AST 54 (H) 07/08/2017 0942   ALT 13 07/26/2020 1327   ALT 53 12/25/2017 0959   ALT 39 07/08/2017 0942   ALKPHOS 79 07/26/2020 1327   ALKPHOS 232 (H) 07/08/2017 0942   BILITOT 0.3 07/26/2020 1327   BILITOT 0.3 12/25/2017 0959   BILITOT 0.42 07/08/2017 0942   GFRNONAA 44 (L) 07/26/2020 1327   GFRNONAA 47 (L) 12/25/2017 0959   GFRAA 49 (L) 04/05/2020 1504   GFRAA 54 (L) 12/25/2017 0959    I No results found for: SPEP  Lab Results  Component Value Date   WBC 5.0 07/26/2020   NEUTROABS  3.5 07/26/2020   HGB 13.9 07/26/2020   HCT 40.8 07/26/2020   MCV 104.6 (H) 07/26/2020   PLT 201 07/26/2020      Chemistry      Component Value Date/Time   NA 141 07/26/2020 1327   NA 139 07/08/2017 0942   K 4.6 07/26/2020 1327   K 3.8 07/08/2017 0942   CL 109 07/26/2020 1327   CL 109 (H) 12/31/2012 0940   CO2 25 07/26/2020 1327   CO2 18 (L) 07/08/2017 0942   BUN 21 07/26/2020 1327   BUN 17.7 07/08/2017 0942   CREATININE 1.32 (H) 07/26/2020 1327   CREATININE 1.18 (H) 12/25/2017 0959   CREATININE 1.0 07/08/2017 0942      Component Value Date/Time   CALCIUM 9.7 07/26/2020 1327   CALCIUM 10.4 07/08/2017 0942   ALKPHOS 79 07/26/2020 1327   ALKPHOS 232 (H) 07/08/2017 0942   AST 14 (L) 07/26/2020 1327   AST 50 (H) 12/25/2017 0959   AST 54 (H) 07/08/2017 0942   ALT 13 07/26/2020 1327   ALT 53 12/25/2017 0959   ALT 39 07/08/2017 0942   BILITOT 0.3 07/26/2020 1327   BILITOT 0.3 12/25/2017 0959   BILITOT 0.42 07/08/2017 0942       Lab Results  Component Value Date   LABCA2 58 (H) 09/14/2012    No components found for: [id number]  No results for input(s): INR in the last 168 hours.  Urinalysis  Component Value Date/Time   COLORURINE YELLOW 11/25/2017 0901   APPEARANCEUR HAZY (A) 11/25/2017 0901   LABSPEC 1.017 11/25/2017 0901   LABSPEC 1.030 07/13/2015 0841   PHURINE 5.0 11/25/2017 0901   GLUCOSEU NEGATIVE 11/25/2017 0901   GLUCOSEU Negative 07/13/2015 0841   HGBUR MODERATE (A) 11/25/2017 0901   BILIRUBINUR NEGATIVE 11/25/2017 0901   BILIRUBINUR Negative 07/13/2015 0841   KETONESUR NEGATIVE 11/25/2017 0901   PROTEINUR 100 (A) 11/25/2017 0901   UROBILINOGEN 0.2 07/13/2015 0841   NITRITE NEGATIVE 11/25/2017 0901   LEUKOCYTESUR NEGATIVE 11/25/2017 0901   LEUKOCYTESUR Negative 07/13/2015 0841    STUDIES: CT Chest W Contrast  Result Date: 07/24/2020 CLINICAL DATA:  Metastatic right breast cancer with history of right mastectomy and ongoing chemotherapy.  Restaging. EXAM: CT CHEST WITH CONTRAST TECHNIQUE: Multidetector CT imaging of the chest was performed during intravenous contrast administration. CONTRAST:  4m OMNIPAQUE IOHEXOL 300 MG/ML  SOLN COMPARISON:  04/25/2020 chest CT. FINDINGS: Cardiovascular: Normal heart size. No significant pericardial effusion/thickening. Atherosclerotic nonaneurysmal thoracic aorta. Normal caliber pulmonary arteries. No central pulmonary emboli. Mediastinum/Nodes: No discrete thyroid nodules. Unremarkable esophagus. No pathologically enlarged axillary, mediastinal or hilar lymph nodes. Lungs/Pleura: No pneumothorax. Small dependent right pleural effusion is stable. No left pleural effusion. Central perihilar right lower lobe 1.4 x 1.1 cm pulmonary nodule with extrinsic narrowing of the right lower lobe bronchus (series 7/image 70), previously 1.5 x 1.0 cm using similar measurement technique, not appreciably changed. Stable platelike scarring versus atelectasis in dependent right lower lobe. No acute consolidative airspace disease, lung masses or new significant pulmonary nodules. Upper abdomen: Stable 1.3 cm left adrenal nodule with density 38 HU, previously characterized as an adenoma. Musculoskeletal: Multiple sclerotic osseous lesions throughout the thoracic skeleton, including the thoracic spine, bilateral ribs and bilateral scapula, not appreciably changed in size or number, largest in the T4 vertebral body and posterior right tenth rib. Stable postsurgical changes from right mastectomy. Mild thoracic spondylosis. IMPRESSION: 1. No new or progressive metastatic disease in the chest. 2. Stable perihilar right lower lobe 1.4 cm pulmonary nodule with extrinsic narrowing of the right lower lobe bronchus. 3. Stable small dependent right pleural effusion. 4. Stable sclerotic osseous metastases throughout the thoracic skeleton as detailed. 5. Stable left adrenal adenoma. 6. Aortic Atherosclerosis (ICD10-I70.0). Electronically Signed    By: JIlona SorrelM.D.   On: 07/24/2020 10:09   MR LIVER W WO CONTRAST  Result Date: 07/25/2020 CLINICAL DATA:  Metastatic breast cancer, Follow up liver metastases EXAM: MRI ABDOMEN WITHOUT AND WITH CONTRAST TECHNIQUE: Multiplanar multisequence MR imaging of the abdomen was performed both before and after the administration of intravenous contrast. CONTRAST:  743mGADAVIST GADOBUTROL 1 MMOL/ML IV SOLN COMPARISON:  MRI abdomen dated 02/01/2020 FINDINGS: Lower chest: Small right pleural effusion with associated right lower lobe opacity, unchanged. Hepatobiliary: At least 6-7 small hepatic metastases are suspected, best visualized on diffusion imaging (series 10/images 58, 59, and 62) and precontrast T1 imaging (series 14/images 41, 42, 47, 49, and 56). These are favored to be mildly progressive when compared to prior MR. For example: --12 mm lesion in segment 2 (series 14/image 42), previously 10 mm --11 mm lesion in segment 5 (series 14/image 49), previously 7 mm --9 mm lesion along the posterior aspect of segment 6 (series 14/image 56), previously 7 mm Gallbladder is unremarkable. No intrahepatic or extrahepatic ductal dilatation. Pancreas:  Within normal limits. Spleen:  Within normal limits. Adrenals/Urinary Tract: 13 mm left adrenal adenoma, benign. Right adrenal gland is within normal  limits. Kidneys are within normal limits.  No hydronephrosis. Stomach/Bowel: Stomach is within normal limits. Visualized bowel is unremarkable. Vascular/Lymphatic:  No evidence of aneurysm. No suspicious abdominal lymphadenopathy. Other:  No abdominal ascites. Musculoskeletal: Multifocal osseous metastases, including T10 and T11 (series 5/image 22) and L3 (series 5/image 20), better evaluated on recent CT and prior bone scan. IMPRESSION: Multiple small hepatic metastases, as described above, favored to be mildly progressive. Multifocal osseous metastases, better evaluated on recent CT and prior bone scan. Small right pleural  effusion with associated right lower lobe opacity, unchanged. Electronically Signed   By: Julian Hy M.D.   On: 07/25/2020 15:39     ASSESSMENT: 69 y.o. [place], [place] woman with stage IV breast cancer, on BOLERO-4 trial  (1) status post right mastectomy and sentinel lymph node sampling 03/22/2004 for a right upper-outer quadrant pT2 pN1, stage IIB invasive ductal carcinoma with lobular features, grade 2, estrogen receptor and progesterone receptor positive, HER-2 negative, with an MIB-1 of 9% ([id number] and [id number])  (2) addition all right axillary lymph node sampling 05/07/2004 showed 2 benign lymph nodes (4 lymph nodes previously removed, so total was one positive lymph node out of 6; [id number])  (3) the patient was evaluated by radiation oncology; no postmastectomy radiation recommended  (4) adjuvant chemotherapy with dose dense doxorubicin and cyclophosphamide x4 cycles (first cycle delayed one week) followed by dose dense paclitaxel x4 was completed 09/18/2004  (5) tamoxifen started March 2006, discontinued 2009  METASTATIC DISEASE: February 2013 (6) presenting with a large pericardial effusion, large right pleural effusion and possible right middle lobe bronchial obstruction February 2013, status post pericardial window placement, fiberoptic bronchoscopy and right Pleurx placement 09/11/2011, with biopsy of the bronchus intermedius and pericardium positive for metastatic breast cancer, estrogen receptor 91% positive with moderate staining intensity, progesterone receptor 100% positive with strong staining intensity, with an MIB-1 of 35%, and no HER-2 amplification, the signals ratio being 1.37 ([id number])  (7) enrolled in BOLERO-4 trial 10/10/2011, receiving letrozole and everolimus  (a) two small areas of enhancement in the cerebellum noted by brain MRI 10/03/2011 were no longer apparent on repeat MRI 08/21/2012-- most recent brain MRI 04/01/2014 showed no evidence of intracranial  metastatic disease  (b) sclerotic lesions in left iliac bone and sacrum have not been biopsied; stable; to start zolendronate after patient updates her dental care (extraction planned)--never started  (c) RLL lung nodule, stable (rescanned 07/12/2014 and 08/11/2014) R hilar and subcarinal lymph nodes: stable  (d) CT of the chest:03/25/2018, stable  (e) CT Angio 12/12/2018 stable  (f) off BOLERO trial (trial ended) as of July 2020, but continuing on study drugs  (g) letrozole and everolimus discontinued January 2021 with disease progression  (8) additional problems:  (a) hepatic steatosis  (b) COPD/ emphysema/ asthma  (c) advanced L hip osteoarthritis, status post left total hip replacement 06/07/2015  (d) aortoiliac atherosclerosis  (e) dental evaluation pending w possible dental extractions  (f) likely thalassemia trait  (g) hyperlipidemia  (9) Bone density concerns: DEXA scan 08/11/2013 was normal  (a) repeat DEXA scan 04/09/2018 shows a T score of -1.8, osteopenia  (10) disease monitoring:  (a) PET scan 12/22/2018 shows no hypermetabolic soft tissue metastases but multifocal bony metastasis  (b) denosumab/Xgeva started on 02/25/2019, repeated every 28 days (delayed due to dental extraction healing issues)  (c) bone scan 05/13/2019 serves as baseline study with increased activity in the right scapular, lower ribs and right femoral head (stable compared to PET scan of May 2020  (  d) bone scan 07/09/2019 stable  (e) CT chest 07/09/2019 shows stable lung lesions but emerging left liver lobe 0.7 cm lesion  (f) liver MRI 08/03/2019 shows approximately 8 liver lesions, the largest measuring 1.8 cm.  (g) liver biopsy 08/06/2019 confirms metastatic breast cancer, estrogen and progesterone receptor positive, with an MIB-1 of 5% and HER-2 not amplified  (h) chest CT and bone scan 11/11/2019 essentially stable  (i) liver MRI 02/01/2020 stable to improved  (j) chest CT and bone scan 04/26/2020  stable  (11) denosumab/Xgeva begun 02/25/2019 repeated every 4 weeks  (12) fulvestrant started 08/24/2019  (a) palbociclib 125 mg daily 21 days on 7 days off started 08/25/2019   PLAN: Nyasiah is doing well today.  She continues on Fulvestrant, Xgeva, Palbociclib and is tolerating this well.  I reviewed her labs which are stable.  She appears to have a very mild progression on her most recent liver MRI.  Since Dr. Jana Hakim is out of town, I recommended that she continue her treatment for the time being.  He will return next week at which point I will ask him to call her to discuss his opinions or results.   Blessen will continue with healthy diet and exercise.  She knows to call for any questions that may arise between now and her next appointment.  We are happy to see her sooner if needed.  Encounter time 20 minutes.Wilber Bihari, NP 07/26/20 2:28 PM Medical Oncology and Hematology [facility] [place], [place] [address] Tel. [phone]    Fax. [phone]    *Total Encounter Time as defined by the Centers for Medicare and Medicaid Services includes, in addition to the face-to-face time of a patient visit (documented in the note above) non-face-to-face time: obtaining and reviewing outside history, ordering and reviewing medications, tests or procedures, care coordination (communications with other health care professionals or caregivers) and documentation in the medical record.

## 2020-07-27 ENCOUNTER — Other Ambulatory Visit (HOSPITAL_COMMUNITY): Payer: Medicare Other

## 2020-07-27 ENCOUNTER — Ambulatory Visit (HOSPITAL_COMMUNITY): Payer: Medicare Other

## 2020-07-27 ENCOUNTER — Telehealth: Payer: Self-pay | Admitting: Adult Health

## 2020-07-27 LAB — CANCER ANTIGEN 27.29: CA 27.29: 321.3 U/mL — ABNORMAL HIGH (ref 0.0–38.6)

## 2020-07-27 NOTE — Telephone Encounter (Signed)
Scheduled appts per 12/29 los. Pt confirmed appt date and time.  

## 2020-08-04 ENCOUNTER — Other Ambulatory Visit: Payer: Self-pay | Admitting: Oncology

## 2020-08-04 ENCOUNTER — Telehealth: Payer: Self-pay

## 2020-08-04 ENCOUNTER — Telehealth: Payer: Self-pay | Admitting: Pharmacist

## 2020-08-04 MED ORDER — RIBOCICLIB SUCC (600 MG DOSE) 200 MG PO TBPK: 600.0000 mg | ORAL_TABLET | Freq: Every day | ORAL | 6 refills | Status: DC

## 2020-08-04 NOTE — Telephone Encounter (Signed)
Oral Oncology Pharmacist Encounter  Received new prescription for Kisqali (ribociclib) for the treatment of metastatic ER/PR positive, HER-2 negative breast cancer in conjunction with fulvestrant, planned duration until disease progression or unacceptable drug toxicity.  Prescription dose and frequency assessed for appropriateness. Appropriate for therapy initiation.   CMP and CBC w/ Diff from 07/26/20 assessed, noted Scr of 1.32 mg/dL (CrCl ~44 mL/min) - no baseline dose adjustments required at this time since CrCl is currently > 30 mL/min.  Current medication list in Epic reviewed, DDIs with Kisqali identified:  Category C DDI between [place] and Metformin - Kisqali may increase ADEs of metformin, recommend monitoring for increased side effects from metformin especially in setting of renal impairment  Category C DDI between Kisqali and Tramadol - Kisqali may concentrations of tramadol through CYP3A4 inhibition - recommend monitoring for increased sedation/lethargy - no change in therapy warranted at this time.   Evaluated chart and no patient barriers to medication adherence noted.   Will proceed with applying for manufacturer assistance through [employer] for [facility] medication assistance.  Oral Oncology Clinic will continue to follow for insurance authorization, copayment issues, initial counseling and start date.  Leron Croak, PharmD, BCPS Hematology/Oncology Clinical Pharmacist [facility] [phone] 08/04/2020 2:31 PM

## 2020-08-04 NOTE — Progress Notes (Signed)
Barbara Parks was already aware of the minimal but measurable progression in her liver.  I called and left her a voicemail today and letting her know we are going to switch her from palbociclib to ribociclib.  She is going to see me 08/22/2020 and we will discuss the slight difference in side effects at that time.  She will start the following day assuming that coincides with her next cycle.  I did tell her to just finish her current palbociclib cycle.

## 2020-08-04 NOTE — Telephone Encounter (Signed)
Oral Oncology Patient Advocate Encounter  Completed an online application for [employer] [place] ([employer]) in an effort to reduce the patient's out of pocket expense for Kisqali to $0.    Application completed and faxed to [phone].   [employer] phone number for follow up is [phone].   This encounter will be updated until final determination.   [place] Patient [place] Phone [phone] Fax [phone] 08/04/2020 1:57 PM

## 2020-08-08 ENCOUNTER — Telehealth: Payer: Self-pay | Admitting: *Deleted

## 2020-08-08 NOTE — Telephone Encounter (Signed)
This RN spoke with the patient today per her VM wanting to follow up on new chemo regimen MD left message about.  She has not heard from the pharmacist yet.  This RN informed her that MD wants her to complete her current cycle of Leslee Home ( she is due to start this week ) and then they can discuss new regimen at next office visit on the 25th.  This RN informed her our oral chemo pharmacy team is is currently working on her new regimen and will be in touch with her when they have processed for lowest cost to her.  Barbara Parks verbalized understanding of plan. No further questions.

## 2020-08-12 ENCOUNTER — Other Ambulatory Visit: Payer: Self-pay | Admitting: Oncology

## 2020-08-16 MED FILL — KISQALI 600 MG DAILY DOSE: 200 | 28 days supply | Qty: 63 | Fill #0

## 2020-08-16 NOTE — Telephone Encounter (Signed)
Patient is approved for Kisqali at no charge from [employer] 08/16/20-07/28/21.  [place] Patient [place] Phone [phone] Fax [phone] 08/16/2020 3:47 PM

## 2020-08-16 NOTE — Telephone Encounter (Signed)
Oral Chemotherapy Pharmacist Encounter   Attempted to reach patient to provide update and offer for initial counseling on oral medication: Kisqali (ribociclib).   No answer. Left voicemail for patient to call back to discuss details of medication acquisition and initial counseling session.  Leron Croak, PharmD, BCPS Hematology/Oncology Clinical Pharmacist [facility] [phone] 08/16/2020 2:15 PM

## 2020-08-16 NOTE — Telephone Encounter (Signed)
Oral Chemotherapy Pharmacist Encounter  I spoke with patient for overview of: Kisqali for the treatment of metastatic, hormone-receptor positive, Her-2 receptor negative breast cancer, in combination with fulvestrant, planned duration until disease progression or unacceptable toxicity.   Counseled patient on administration, dosing, side effects, monitoring, drug-food interactions, safe handling, storage, and disposal.  Patient will take Kisqali 2110m tablets, 3 tablets (6064m by mouth once daily without regard to food.  Patient will take Kisqali for 3 weeks on, 1 week off, repeat every 4 weeks.  Patient knows to avoid grapefruit and grapefruit juice.  Kisqali start date: 08/23/20  Patient will need EKG on 09/05/20 (~C1D14) and then repeat EKG on 09/20/20 (~C2D1) per manufacturer recommendations. Electrolytes will also be closely monitored at treatment initiation.  Adverse effects include but are not limited to: nausea, vomiting, diarrhea, constipation, fatigue, rash, decreased blood counts, and altered cardiac conduction. Severe, life-threatening, and/or fatal interstitial lung disease (ILD) and/or pneumonitis may occur with CDK 4/6 inhibitors.  Patient has anti-emetic on hand and knows to take it if nausea develops.   Patient will obtain anti diarrheal and alert the office of 4 or more loose stools above baseline.  Reviewed with patient importance of keeping a medication schedule and plan for any missed doses. No barriers to medication adherence identified.  Medication reconciliation performed and medication/allergy list updated.  Free 30-day card obtained for 1st fill of Kisqali. Patient application is currently pending for medication assistance through [employer]  Patient will pick this up from the [facility] 08/22/20.  Patient informed the pharmacy will reach out 5-7 days prior to needing next fill of Kisqali to coordinate continued medication acquisition to  prevent break in therapy.  All questions answered.  Ms. HeStockdaleoiced understanding and appreciation.   Medication education handout placed in mail for patient. Patient knows to call the office with questions or concerns. Oral Chemotherapy Clinic phone number provided to patient.   ReLeron CroakPharmD, BCPS Hematology/Oncology Clinical Pharmacist [phone]/19/2022 3:24 PM

## 2020-08-18 ENCOUNTER — Other Ambulatory Visit: Payer: Self-pay | Admitting: Oncology

## 2020-08-18 NOTE — Progress Notes (Signed)
Patient will need EKG on 09/05/20 (~C1D14) and then repeat EKG on 09/20/20 (~C2D1) per manufacturer recommendations.

## 2020-08-21 NOTE — Progress Notes (Signed)
[facility]  [phone] [phone]    ID: Barbara Parks DOB: 1951/06/04  MR#: [id number]  VPX#:[id number]  Patient Care Team: [name], MD as PCP - General (Family Medicine) Jerline Pain, MD (Cardiology) Gaye Pollack, MD (Cardiothoracic Surgery) Britta Louth, Virgie Dad, MD as Consulting Physician (Oncology) OTHER MD: Dr Erroll Luna- Merlene Laughter, DDS; Gaynelle Arabian MD   CHIEF COMPLAINT: metastatic breast cancer, formerly on BOLERO study (s/p right mastectomy)  CURRENT TREATMENT: Fulvestrant, ribociclib, denosumab/Xgeva   INTERVAL HISTORY: Barbara Parks returns today for follow-up and treatment of her metastatic breast cancer.   Barbara Parks underwent an MRI of her liver yesterday that showed mild progression in three areas by 2-19m.  She had a CT chest on 12/27 that showed no disease progression.    We are switching her palbociclib to ribociclib.She will have an EKG today and next visit  She continues on fulvestrant every 4 weeks and is tolerating this quite well.  She is also receiving xgeva every 4 weeks with no issues.  She has no dental concners today.    She is up to date on mammography, most recently on 04/13/2020.    REVIEW OF SYSTEMS: [place]  This kept her endorse for most of the time.  She was barely able to walk her dog.  Otherwise she is doing well and has had no unusual headaches visual changes nausea vomiting mouth sores dizziness gait imbalance falls cough phlegm production pleurisy shortness of breath or change in bowel or bladder habits.  She generally feels well.   COVID 19 VACCINATION STATUS: Refuses vaccination   BREAST CANCER HISTORY: From Dr. PCollier SalinaRubin's original intake note 04/13/2004:  "This woman has been in good health all of her life. [name].  She palpated a mass at about the 12 o'clock position in mid-July.  She has not noticed any nipple retraction or skin changes.  She was seen by her primary care doctor who subsequently referred her for a mammogram.  Mammogram was performed on 03/01/04. This demonstrated a spiculated 2 cm mass at the 12 o'clock position in the right breast.  Upper outer quadrant of the left breast shows some distortion.   Physical exam at that time showed a firm, nontender nodule at the 12 o'clock position in the right breast, 5 cm from the nipple.  Ultrasound of this area showed a hypoechoic ill-defined mass, measuring 2.2 x 1.3 x 1.4 cm.  Physical exam of the left breast showed general vague thickening, upper outer quadrant of the left breast with a discrete palpable mass. The ultrasound performed showed a single hypoechoic ill-defined nodule at the 1 o'clock position, measuring 7 x 5 x 6 mm.    She had biopsies of both lesions on 03/02/03.  Needle core biopsy of the lesion on the right breast revealed invasive mammary carcinoma.  Needle core biopsy of the left breast showed a complex fibroadenoma.  Prognostic panel of the lesion on the right breast showed it to be ER positive at 73%, PR positive at 90% and proliferative index 9%, HER-2 was 1+.  Patient was referred to Dr. YAnnamaria Boots who performed a simple mastectomy with sentinel lymph node evaluation on 03/22/04.  Final pathology showed this to be an invasive ductal carcinoma  with lobular features, measuring 2.2 cm, grade 2 of 3.  Margins negative for carcinoma.  Invasive ductal carcinoma was extended  to involve deep dermis of the nipple.  Lymphovascular invasion was identified.  Total of 4 sentinel lymph nodes were evaluated.  Touch imprints at the time of the OR was felt to be negative.  Subsequent evaluation showed a 5 mm focus of metastatic carcinoma in one of the four lymph nodes on microscopic after sectioning.  There was extracapsular extension  of one lymph node as well. The remaining three lymph nodes were all negative."  The patient's  subsequent history is detailed above.   PAST MEDICAL HISTORY: Past Medical History:  Diagnosis Date  . Arthritis    left hip  . Asthma   . breast ca 2005   breast/chemo R mastectomy  . Dermatitis   . Diabetes mellitus without complication ([facility])   . GERD (gastroesophageal reflux disease)   . Hypercholesteremia   . Metastasis to lung ([place]) dx'd 08/2011  . Osteopenia due to cancer therapy 09/09/2013  . Palpitations   . Personal history of chemotherapy   . Shortness of breath     PAST SURGICAL HISTORY: Past Surgical History:  Procedure Laterality Date  . BREAST SURGERY  2005   right  . CATARACT EXTRACTION W/PHACO Left 01/18/2014   Procedure: CATARACT EXTRACTION PHACO AND INTRAOCULAR LENS PLACEMENT (IOC);  Surgeon: Elta Guadeloupe T. Gershon Crane, MD;  Location: [facility];  Service: Ophthalmology;  Laterality: Left;  CDE 15.79  . CATARACT EXTRACTION W/PHACO Right 02/08/2014   Procedure: CATARACT EXTRACTION PHACO AND INTRAOCULAR LENS PLACEMENT (IOC);  Surgeon: Elta Guadeloupe T. Gershon Crane, MD;  Location: [facility];  Service: Ophthalmology;  Laterality: Right;  CDE 4.41  . CHEST TUBE INSERTION  09/11/2011   Procedure: INSERTION PLEURAL DRAINAGE CATHETER;  Surgeon: Gaye Pollack, MD;  Location: [facility];  Service: Thoracic;  Laterality: Right;  . MASTECTOMY Right   . PERICARDIAL WINDOW  09/11/2011   Procedure: PERICARDIAL WINDOW;  Surgeon: Gaye Pollack, MD;  Location: [facility] OR;  Service: Thoracic;  Laterality: N/A;  . REMOVAL OF PLEURAL DRAINAGE CATHETER  12/19/2011   Procedure: REMOVAL OF PLEURAL DRAINAGE CATHETER;  Surgeon: Gaye Pollack, MD;  Location: [place] [facility];  Service: Thoracic;  Laterality: Right;  TO BE DONE IN MINOR ROOM, SHORT STAY  . [place]  . TOTAL HIP ARTHROPLASTY Left 06/07/2015   Procedure: LEFT TOTAL HIP ARTHROPLASTY ANTERIOR APPROACH;  Surgeon: Gaynelle Arabian, MD;  Location: [facility];  Service: Orthopedics;  Laterality: Left;  Marland Kitchen VIDEO BRONCHOSCOPY  09/11/2011   Procedure: VIDEO BRONCHOSCOPY;  Surgeon:  Gaye Pollack, MD;  Location: [facility] OR;  Service: Thoracic;  Laterality: N/A;    FAMILY HISTORY Family History  Problem Relation Age of Onset  . Cancer Mother        breast  . Heart disease Father   . Diabetes Other   . Anesthesia problems Neg Hx    The patient's mother was diagnosed with breast cancer at age 60, she died age 48 from congestive heart failure.The patient's father died from heart disease at age 72.  She has one sister alive & well.  Two brothers alive & well, one with diabetes. The patient's sister was also diagnosed with breast cancer, and was tested for the BRCA gene, and was negative. The patient herself has not been tested. There is no history of ovarian cancer in the family   GYNECOLOGIC HISTORY:  No LMP recorded. Patient is postmenopausal. Menarche age 31, the patient is GX P0. She stopped having periods with chemotherapy in 2005. 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 She was a Archivist. She is single, lives alone with her Shitzu-poodle Sammie.. Family is all in the [place] area.     ADVANCED DIRECTIVES: Not in place. At the 02/23/2014 visit the patient was given the appropriate documents to complete and notarize at her discretion. She tells me she is planning to name her sister, Billie Ruddy, as healthcare power of attorney. [name] can be reached at [phone]   HEALTH MAINTENANCE: Social History   Tobacco Use  . Smoking status: Former Smoker    Packs/day: 1.50    Years: 30.00    Pack years: 45.00    Types: Cigarettes    Quit date: 09/10/2007    Years since quitting: 12.9  . Smokeless tobacco: Never Used  Substance Use Topics  . Alcohol use: No  . Drug use: No    Colonoscopy:  PAP:  Bone density: 04/09/2018, T score -1.8  Lipid panel:  Allergies  Allergen Reactions  . Aspirin     REACTION: upset stomach  . Latex Other  (See Comments)    Blistering and skin peels off  . Nsaids Nausea And Vomiting    Extreme nausea and vomiting    Current Outpatient Medications  Medication Sig Dispense Refill  . cholecalciferol (VITAMIN D3) 25 MCG (1000 UT) tablet Take 2 tablets (2,000 Units total) by mouth daily. 180 tablet 4  . cyclobenzaprine (FLEXERIL) 10 MG tablet Take 1 tablet (10 mg total) by mouth 2 (two) times daily as needed for muscle spasms. 20 tablet 0  . gemfibrozil (LOPID) 600 MG tablet TAKE 1 TABLET (600 MG TOTAL) BY MOUTH 2 (TWO) TIMES DAILY BEFORE A MEAL. 180 tablet 1  . loperamide (IMODIUM) 2 MG capsule Take 2 mg by mouth 4 (four) times daily as needed for diarrhea or loose stools. Reported on 11/30/2015    . metFORMIN (GLUCOPHAGE-XR) 500 MG 24 hr tablet Take 1 tablet (500 mg total) by mouth 3 (three) times daily before meals.  3  . ribociclib succ (KISQALI 600MG DAILY DOSE) 200 MG Therapy Pack Take 3 tablets (600 mg total) by mouth daily. Take for 21 days on, 7 days off, repeat every 28 days. Start 08/23/2020 63 tablet 6  . traMADol (ULTRAM) 50 MG tablet Take 0.5-1 tablets (25-50 mg total) by mouth every 6 (six) hours as needed. 30 tablet 0   No current facility-administered medications for this visit.    OBJECTIVE: White woman who appears younger than stated age 56:   08/22/20 1351  BP: 107/78  Pulse: 100  Resp: 18  Temp: (!) 97.5 F (36.4 C)  SpO2: 97%   Wt Readings from Last 3 Encounters:  08/22/20 156 lb 8 oz (71 kg)  07/26/20 159 lb 9.6 oz (72.4 kg)  05/30/20 154 lb 14.4 oz (70.3 kg)   Body mass index is 26.45 kg/m.    ECOG FS:1 - Symptomatic but completely ambulatory  Sclerae unicteric, EOMs intact Wearing a mask No cervical or supraclavicular adenopathy Lungs no rales or rhonchi Heart regular rate and rhythm Abd soft, nontender, positive bowel sounds MSK no focal spinal tenderness, no upper extremity lymphedema Neuro: nonfocal, well oriented, appropriate affect Breasts: The  right breast is status post mastectomy.  There is no evidence of local recurrence.  The left breast is benign.  Both axillae are benign   LAB  RESULTS:  CMP  Component Value Date/Time   NA 141 07/26/2020 1327   NA 139 07/08/2017 0942   K 4.6 07/26/2020 1327   K 3.8 07/08/2017 0942   CL 109 07/26/2020 1327   CL 109 (H) 12/31/2012 0940   CO2 25 07/26/2020 1327   CO2 18 (L) 07/08/2017 0942   GLUCOSE 103 (H) 07/26/2020 1327   GLUCOSE 156 (H) 07/08/2017 0942   GLUCOSE 127 (H) 12/31/2012 0940   BUN 21 07/26/2020 1327   BUN 17.7 07/08/2017 0942   CREATININE 1.32 (H) 07/26/2020 1327   CREATININE 1.18 (H) 12/25/2017 0959   CREATININE 1.0 07/08/2017 0942   CALCIUM 9.7 07/26/2020 1327   CALCIUM 10.4 07/08/2017 0942   PROT 7.7 07/26/2020 1327   PROT 7.7 07/08/2017 0942   ALBUMIN 3.5 07/26/2020 1327   ALBUMIN 3.4 (L) 07/08/2017 0942   AST 14 (L) 07/26/2020 1327   AST 50 (H) 12/25/2017 0959   AST 54 (H) 07/08/2017 0942   ALT 13 07/26/2020 1327   ALT 53 12/25/2017 0959   ALT 39 07/08/2017 0942   ALKPHOS 79 07/26/2020 1327   ALKPHOS 232 (H) 07/08/2017 0942   BILITOT 0.3 07/26/2020 1327   BILITOT 0.3 12/25/2017 0959   BILITOT 0.42 07/08/2017 0942   GFRNONAA 44 (L) 07/26/2020 1327   GFRNONAA 47 (L) 12/25/2017 0959   GFRAA 49 (L) 04/05/2020 1504   GFRAA 54 (L) 12/25/2017 0959    I No results found for: SPEP  Lab Results  Component Value Date   WBC 11.0 (H) 08/22/2020   NEUTROABS 7.1 08/22/2020   HGB 15.3 (H) 08/22/2020   HCT 45.7 08/22/2020   MCV 103.6 (H) 08/22/2020   PLT 355 08/22/2020      Chemistry      Component Value Date/Time   NA 141 07/26/2020 1327   NA 139 07/08/2017 0942   K 4.6 07/26/2020 1327   K 3.8 07/08/2017 0942   CL 109 07/26/2020 1327   CL 109 (H) 12/31/2012 0940   CO2 25 07/26/2020 1327   CO2 18 (L) 07/08/2017 0942   BUN 21 07/26/2020 1327   BUN 17.7 07/08/2017 0942   CREATININE 1.32 (H) 07/26/2020 1327   CREATININE 1.18 (H) 12/25/2017  0959   CREATININE 1.0 07/08/2017 0942      Component Value Date/Time   CALCIUM 9.7 07/26/2020 1327   CALCIUM 10.4 07/08/2017 0942   ALKPHOS 79 07/26/2020 1327   ALKPHOS 232 (H) 07/08/2017 0942   AST 14 (L) 07/26/2020 1327   AST 50 (H) 12/25/2017 0959   AST 54 (H) 07/08/2017 0942   ALT 13 07/26/2020 1327   ALT 53 12/25/2017 0959   ALT 39 07/08/2017 0942   BILITOT 0.3 07/26/2020 1327   BILITOT 0.3 12/25/2017 0959   BILITOT 0.42 07/08/2017 0942       Lab Results  Component Value Date   LABCA2 58 (H) 09/14/2012    No components found for: [id number]  No results for input(s): INR in the last 168 hours.  Urinalysis    Component Value Date/Time   COLORURINE YELLOW 11/25/2017 0901   APPEARANCEUR HAZY (A) 11/25/2017 0901   LABSPEC 1.017 11/25/2017 0901   LABSPEC 1.030 07/13/2015 0841   PHURINE 5.0 11/25/2017 0901   GLUCOSEU NEGATIVE 11/25/2017 0901   GLUCOSEU Negative 07/13/2015 0841   HGBUR MODERATE (A) 11/25/2017 0901   BILIRUBINUR NEGATIVE 11/25/2017 0901   BILIRUBINUR Negative 07/13/2015 0841   KETONESUR NEGATIVE 11/25/2017 0901   PROTEINUR 100 (A) 11/25/2017 0901   UROBILINOGEN  0.2 07/13/2015 0841   NITRITE NEGATIVE 11/25/2017 0901   LEUKOCYTESUR NEGATIVE 11/25/2017 0901   LEUKOCYTESUR Negative 07/13/2015 0841    STUDIES: CT Chest W Contrast  Result Date: 07/24/2020 CLINICAL DATA:  Metastatic right breast cancer with history of right mastectomy and ongoing chemotherapy. Restaging. EXAM: CT CHEST WITH CONTRAST TECHNIQUE: Multidetector CT imaging of the chest was performed during intravenous contrast administration. CONTRAST:  60m OMNIPAQUE IOHEXOL 300 MG/ML  SOLN COMPARISON:  04/25/2020 chest CT. FINDINGS: Cardiovascular: Normal heart size. No significant pericardial effusion/thickening. Atherosclerotic nonaneurysmal thoracic aorta. Normal caliber pulmonary arteries. No central pulmonary emboli. Mediastinum/Nodes: No discrete thyroid nodules. Unremarkable esophagus.  No pathologically enlarged axillary, mediastinal or hilar lymph nodes. Lungs/Pleura: No pneumothorax. Small dependent right pleural effusion is stable. No left pleural effusion. Central perihilar right lower lobe 1.4 x 1.1 cm pulmonary nodule with extrinsic narrowing of the right lower lobe bronchus (series 7/image 70), previously 1.5 x 1.0 cm using similar measurement technique, not appreciably changed. Stable platelike scarring versus atelectasis in dependent right lower lobe. No acute consolidative airspace disease, lung masses or new significant pulmonary nodules. Upper abdomen: Stable 1.3 cm left adrenal nodule with density 38 HU, previously characterized as an adenoma. Musculoskeletal: Multiple sclerotic osseous lesions throughout the thoracic skeleton, including the thoracic spine, bilateral ribs and bilateral scapula, not appreciably changed in size or number, largest in the T4 vertebral body and posterior right tenth rib. Stable postsurgical changes from right mastectomy. Mild thoracic spondylosis. IMPRESSION: 1. No new or progressive metastatic disease in the chest. 2. Stable perihilar right lower lobe 1.4 cm pulmonary nodule with extrinsic narrowing of the right lower lobe bronchus. 3. Stable small dependent right pleural effusion. 4. Stable sclerotic osseous metastases throughout the thoracic skeleton as detailed. 5. Stable left adrenal adenoma. 6. Aortic Atherosclerosis (ICD10-I70.0). Electronically Signed   By: JIlona SorrelM.D.   On: 07/24/2020 10:09   MR LIVER W WO CONTRAST  Result Date: 07/25/2020 CLINICAL DATA:  Metastatic breast cancer, Follow up liver metastases EXAM: MRI ABDOMEN WITHOUT AND WITH CONTRAST TECHNIQUE: Multiplanar multisequence MR imaging of the abdomen was performed both before and after the administration of intravenous contrast. CONTRAST:  761mGADAVIST GADOBUTROL 1 MMOL/ML IV SOLN COMPARISON:  MRI abdomen dated 02/01/2020 FINDINGS: Lower chest: Small right pleural effusion  with associated right lower lobe opacity, unchanged. Hepatobiliary: At least 6-7 small hepatic metastases are suspected, best visualized on diffusion imaging (series 10/images 58, 59, and 62) and precontrast T1 imaging (series 14/images 41, 42, 47, 49, and 56). These are favored to be mildly progressive when compared to prior MR. For example: --12 mm lesion in segment 2 (series 14/image 42), previously 10 mm --11 mm lesion in segment 5 (series 14/image 49), previously 7 mm --9 mm lesion along the posterior aspect of segment 6 (series 14/image 56), previously 7 mm Gallbladder is unremarkable. No intrahepatic or extrahepatic ductal dilatation. Pancreas:  Within normal limits. Spleen:  Within normal limits. Adrenals/Urinary Tract: 13 mm left adrenal adenoma, benign. Right adrenal gland is within normal limits. Kidneys are within normal limits.  No hydronephrosis. Stomach/Bowel: Stomach is within normal limits. Visualized bowel is unremarkable. Vascular/Lymphatic:  No evidence of aneurysm. No suspicious abdominal lymphadenopathy. Other:  No abdominal ascites. Musculoskeletal: Multifocal osseous metastases, including T10 and T11 (series 5/image 22) and L3 (series 5/image 20), better evaluated on recent CT and prior bone scan. IMPRESSION: Multiple small hepatic metastases, as described above, favored to be mildly progressive. Multifocal osseous metastases, better evaluated on recent CT and  prior bone scan. Small right pleural effusion with associated right lower lobe opacity, unchanged. Electronically Signed   By: Julian Hy M.D.   On: 07/25/2020 15:39     ASSESSMENT: 70 y.o. Barbara Parks, Barbara Parks woman with stage IV breast cancer, on BOLERO-4 trial  (1) status post right mastectomy and sentinel lymph node sampling 03/22/2004 for a right upper-outer quadrant pT2 pN1, stage IIB invasive ductal carcinoma with lobular features, grade 2, estrogen receptor and progesterone receptor positive, HER-2 negative, with an MIB-1 of  9% ([id number] and [id number])  (2) addition all right axillary lymph node sampling 05/07/2004 showed 2 benign lymph nodes (4 lymph nodes previously removed, so total was one positive lymph node out of 6; [id number])  (3) the patient was evaluated by radiation oncology; no postmastectomy radiation recommended  (4) adjuvant chemotherapy with dose dense doxorubicin and cyclophosphamide x4 cycles (first cycle delayed one week) followed by dose dense paclitaxel x4 was completed 09/18/2004  (5) tamoxifen started March 2006, discontinued 2009  METASTATIC DISEASE: February 2013 (6) presenting with a large pericardial effusion, large right pleural effusion and possible right middle lobe bronchial obstruction February 2013, status post pericardial window placement, fiberoptic bronchoscopy and right Pleurx placement 09/11/2011, with biopsy of the bronchus intermedius and pericardium positive for metastatic breast cancer, estrogen receptor 91% positive with moderate staining intensity, progesterone receptor 100% positive with strong staining intensity, with an MIB-1 of 35%, and no HER-2 amplification, the signals ratio being 1.37 ([id number] [id number])  (7) enrolled in BOLERO-4 trial 10/10/2011, receiving letrozole and everolimus  (a) two small areas of enhancement in the cerebellum noted by brain MRI 10/03/2011 were no longer apparent on repeat MRI 08/21/2012-- most recent brain MRI 04/01/2014 showed no evidence of intracranial metastatic disease  (b) sclerotic lesions in left iliac bone and sacrum have not been biopsied; stable; to start zolendronate after patient updates her dental care (extraction planned)--never started  (c) RLL lung nodule, stable (rescanned 07/12/2014 and 08/11/2014) R hilar and subcarinal lymph nodes: stable  (d) CT of the chest:03/25/2018, stable  (e) CT Angio 12/12/2018 stable  (f) off BOLERO trial (trial ended) as of July 2020, but continuing on study drugs  (g) letrozole and everolimus  discontinued January 2021 with disease progression  (8) additional problems:  (a) hepatic steatosis  (b) COPD/ emphysema/ asthma  (c) advanced L hip osteoarthritis, status post left total hip replacement 06/07/2015  (d) aortoiliac atherosclerosis  (e) dental evaluation pending w possible dental extractions  (f) likely thalassemia trait  (g) hyperlipidemia  (9) Bone density concerns: DEXA scan 08/11/2013 was normal  (a) repeat DEXA scan 04/09/2018 shows a T score of -1.8, osteopenia  (10) disease monitoring:  (a) PET scan 12/22/2018 shows no hypermetabolic soft tissue metastases but multifocal bony metastasis  (b) denosumab/Xgeva started on 02/25/2019, repeated every 28 days (delayed due to dental extraction healing issues)  (c) bone scan 05/13/2019 serves as baseline study with increased activity in the right scapular, lower ribs and right femoral head (stable compared to PET scan of May 2020  (d) bone scan 07/09/2019 stable  (e) CT chest 07/09/2019 shows stable lung lesions but emerging left liver lobe 0.7 cm lesion  (f) liver MRI 08/03/2019 shows approximately 8 liver lesions, the largest measuring 1.8 cm.  (g) liver biopsy 08/06/2019 confirms metastatic breast cancer, estrogen and progesterone receptor positive, with an MIB-1 of 5% and HER-2 not amplified  (h) chest CT and bone scan 11/11/2019 essentially stable  (i) liver MRI 02/01/2020 stable to improved  (j)  chest CT and bone scan 04/26/2020 stable  (11) denosumab/Xgeva begun 02/25/2019 repeated every 4 weeks  (12) fulvestrant started 08/24/2019  (a) palbociclib 125 mg daily 21 days on 7 days off started 08/25/2019  (b) chest CT 07/24/2020 stable  (c) liver MRI 07/25/2020 shows mild progression in measurable disease  (d) palbociclib discontinued and ribociclib substituted starting 08/22/2020   (i) QTc on 08/22/2020 was 430   (ii) QTc   PLAN: Barbara Parks's cancer specifically some of her liver lesions show its mild evidence of  progression, just a few millimeters, but this was not just a registration issue since there were more than a couple of spots that seem to grow.  Accordingly we are changing her treatment plan, although minimally.  She is going to start ribociclib tomorrow.  We will use that instead of palbociclib for the next 3 cycles and then repeat a liver MRI to see if we are getting anywhere  She has a good understanding of the possible toxicity side effects and complications and is being provided the medication through our oral pharmacy chemotherapy specialists.  I am also calling her little Compazine in case she has nausea with this, although I do not expect she will have much trouble in that regard  We checked her QTC today and we will check it again when she returns to see me in 28 days  I urged her to call us with any problem that she thinks might be related to this medication even if it seems small.  Total encounter time 25 minutes.Sarajane Jews C. Tomasa Dobransky, MD 08/22/20 2:10 PM Medical Oncology and Hematology [facility] [place], [place] [address] Tel. [phone]    Fax. [phone]   I, Wilburn Mylar, am acting as scribe for Dr. Virgie Dad. Yousef Huge.  I, Lurline Del MD, have reviewed the above documentation for accuracy and completeness, and I agree with the above.    *Total Encounter Time as defined by the Centers for Medicare and Medicaid Services includes, in addition to the face-to-face time of a patient visit (documented in the note above) non-face-to-face time: obtaining and reviewing outside history, ordering and reviewing medications, tests or procedures, care coordination (communications with other health care professionals or caregivers) and documentation in the medical record.

## 2020-08-22 ENCOUNTER — Other Ambulatory Visit: Payer: Self-pay

## 2020-08-22 ENCOUNTER — Inpatient Hospital Stay (HOSPITAL_BASED_OUTPATIENT_CLINIC_OR_DEPARTMENT_OTHER): Payer: Medicare Other | Admitting: Oncology

## 2020-08-22 ENCOUNTER — Inpatient Hospital Stay: Payer: Medicare Other | Attending: Oncology

## 2020-08-22 ENCOUNTER — Inpatient Hospital Stay: Payer: Medicare Other

## 2020-08-22 VITALS — BP 107/78 | HR 100 | Temp 97.5°F | Resp 18 | Ht 64.5 in | Wt 156.5 lb

## 2020-08-22 DIAGNOSIS — J4520 Mild intermittent asthma, uncomplicated: Secondary | ICD-10-CM

## 2020-08-22 DIAGNOSIS — C50411 Malignant neoplasm of upper-outer quadrant of right female breast: Secondary | ICD-10-CM | POA: Diagnosis not present

## 2020-08-22 DIAGNOSIS — Z170 Estrogen receptor positive status [ER+]: Secondary | ICD-10-CM

## 2020-08-22 DIAGNOSIS — C801 Malignant (primary) neoplasm, unspecified: Secondary | ICD-10-CM

## 2020-08-22 DIAGNOSIS — C7951 Secondary malignant neoplasm of bone: Secondary | ICD-10-CM

## 2020-08-22 DIAGNOSIS — I3131 Malignant pericardial effusion in diseases classified elsewhere: Secondary | ICD-10-CM

## 2020-08-22 DIAGNOSIS — C7800 Secondary malignant neoplasm of unspecified lung: Secondary | ICD-10-CM | POA: Diagnosis not present

## 2020-08-22 DIAGNOSIS — I313 Pericardial effusion (noninflammatory): Secondary | ICD-10-CM

## 2020-08-22 DIAGNOSIS — C50911 Malignant neoplasm of unspecified site of right female breast: Secondary | ICD-10-CM

## 2020-08-22 DIAGNOSIS — Z5111 Encounter for antineoplastic chemotherapy: Secondary | ICD-10-CM | POA: Insufficient documentation

## 2020-08-22 DIAGNOSIS — M8580 Other specified disorders of bone density and structure, unspecified site: Secondary | ICD-10-CM | POA: Diagnosis not present

## 2020-08-22 DIAGNOSIS — Z79899 Other long term (current) drug therapy: Secondary | ICD-10-CM | POA: Insufficient documentation

## 2020-08-22 DIAGNOSIS — C787 Secondary malignant neoplasm of liver and intrahepatic bile duct: Secondary | ICD-10-CM | POA: Diagnosis not present

## 2020-08-22 DIAGNOSIS — Z7189 Other specified counseling: Secondary | ICD-10-CM

## 2020-08-22 LAB — CBC WITH DIFFERENTIAL/PLATELET
Abs Immature Granulocytes: 0.43 10*3/uL — ABNORMAL HIGH (ref 0.00–0.07)
Basophils Absolute: 0.2 10*3/uL — ABNORMAL HIGH (ref 0.0–0.1)
Basophils Relative: 2 %
Eosinophils Absolute: 0.1 10*3/uL (ref 0.0–0.5)
Eosinophils Relative: 1 %
HCT: 45.7 % (ref 36.0–46.0)
Hemoglobin: 15.3 g/dL — ABNORMAL HIGH (ref 12.0–15.0)
Immature Granulocytes: 4 %
Lymphocytes Relative: 14 %
Lymphs Abs: 1.6 10*3/uL (ref 0.7–4.0)
MCH: 34.7 pg — ABNORMAL HIGH (ref 26.0–34.0)
MCHC: 33.5 g/dL (ref 30.0–36.0)
MCV: 103.6 fL — ABNORMAL HIGH (ref 80.0–100.0)
Monocytes Absolute: 1.6 10*3/uL — ABNORMAL HIGH (ref 0.1–1.0)
Monocytes Relative: 15 %
Neutro Abs: 7.1 10*3/uL (ref 1.7–7.7)
Neutrophils Relative %: 64 %
Platelets: 355 10*3/uL (ref 150–400)
RBC: 4.41 MIL/uL (ref 3.87–5.11)
RDW: 13 % (ref 11.5–15.5)
WBC: 11 10*3/uL — ABNORMAL HIGH (ref 4.0–10.5)
nRBC: 0 % (ref 0.0–0.2)

## 2020-08-22 LAB — COMPREHENSIVE METABOLIC PANEL
ALT: 8 U/L (ref 0–44)
AST: 14 U/L — ABNORMAL LOW (ref 15–41)
Albumin: 3.8 g/dL (ref 3.5–5.0)
Alkaline Phosphatase: 112 U/L (ref 38–126)
Anion gap: 9 (ref 5–15)
BUN: 27 mg/dL — ABNORMAL HIGH (ref 8–23)
CO2: 21 mmol/L — ABNORMAL LOW (ref 22–32)
Calcium: 10.4 mg/dL — ABNORMAL HIGH (ref 8.9–10.3)
Chloride: 107 mmol/L (ref 98–111)
Creatinine, Ser: 1.3 mg/dL — ABNORMAL HIGH (ref 0.44–1.00)
GFR, Estimated: 45 mL/min — ABNORMAL LOW (ref >60)
Glucose, Bld: 114 mg/dL — ABNORMAL HIGH (ref 70–99)
Potassium: 5.4 mmol/L — ABNORMAL HIGH (ref 3.5–5.1)
Sodium: 137 mmol/L (ref 135–145)
Total Bilirubin: 0.4 mg/dL (ref 0.3–1.2)
Total Protein: 8.2 g/dL — ABNORMAL HIGH (ref 6.5–8.1)

## 2020-08-22 MED ORDER — FULVESTRANT 250 MG/5ML IM SOLN
INTRAMUSCULAR | Status: AC
  Filled 2020-08-22: qty 10

## 2020-08-22 MED ORDER — PROCHLORPERAZINE MALEATE 10 MG PO TABS: 10.0000 mg | ORAL_TABLET | Freq: Three times a day (TID) | ORAL | 0 refills | Status: DC | PRN

## 2020-08-22 MED ORDER — DENOSUMAB 120 MG/1.7ML ~~LOC~~ SOLN
SUBCUTANEOUS | Status: AC
  Filled 2020-08-22: qty 1.7

## 2020-08-22 MED ORDER — DENOSUMAB 120 MG/1.7ML ~~LOC~~ SOLN
120.0000 mg | Freq: Once | SUBCUTANEOUS | Status: AC
Start: 2020-08-22 — End: 2020-08-22
  Administered 2020-08-22: 120 mg via SUBCUTANEOUS

## 2020-08-22 MED ORDER — FULVESTRANT 250 MG/5ML IM SOLN
500.0000 mg | Freq: Once | INTRAMUSCULAR | Status: AC
  Administered 2020-08-22: 500 mg via INTRAMUSCULAR

## 2020-08-22 NOTE — Patient Instructions (Signed)
Fulvestrant injection What is this medicine? FULVESTRANT (ful VES trant) blocks the effects of estrogen. It is used to treat breast cancer. This medicine may be used for other purposes; ask your health care provider or pharmacist if you have questions. COMMON BRAND NAME(S): FASLODEX What should I tell my health care provider before I take this medicine? They need to know if you have any of these conditions:  bleeding disorders  liver disease  low blood counts, like low white cell, platelet, or red cell counts  an unusual or allergic reaction to fulvestrant, other medicines, foods, dyes, or preservatives  pregnant or trying to get pregnant  breast-feeding How should I use this medicine? This medicine is for injection into a muscle. It is usually given by a health care professional in a hospital or clinic setting. Talk to your pediatrician regarding the use of this medicine in children. Special care may be needed. Overdosage: If you think you have taken too much of this medicine contact a poison control center or emergency room at once. NOTE: This medicine is only for you. Do not share this medicine with others. What if I miss a dose? It is important not to miss your dose. Call your doctor or health care professional if you are unable to keep an appointment. What may interact with this medicine?  medicines that treat or prevent blood clots like warfarin, enoxaparin, dalteparin, apixaban, dabigatran, and rivaroxaban This list may not describe all possible interactions. Give your health care provider a list of all the medicines, herbs, non-prescription drugs, or dietary supplements you use. Also tell them if you smoke, drink alcohol, or use illegal drugs. Some items may interact with your medicine. What should I watch for while using this medicine? Your condition will be monitored carefully while you are receiving this medicine. You will need important blood work done while you are taking  this medicine. Do not become pregnant while taking this medicine or for at least 1 year after stopping it. Women of child-bearing potential will need to have a negative pregnancy test before starting this medicine. Women should inform their doctor if they wish to become pregnant or think they might be pregnant. There is a potential for serious side effects to an unborn child. Men should inform their doctors if they wish to father a child. This medicine may lower sperm counts. Talk to your health care professional or pharmacist for more information. Do not breast-feed an infant while taking this medicine or for 1 year after the last dose. What side effects may I notice from receiving this medicine? Side effects that you should report to your doctor or health care professional as soon as possible:  allergic reactions like skin rash, itching or hives, swelling of the face, lips, or tongue  feeling faint or lightheaded, falls  pain, tingling, numbness, or weakness in the legs  signs and symptoms of infection like fever or chills; cough; flu-like symptoms; sore throat  vaginal bleeding Side effects that usually do not require medical attention (report to your doctor or health care professional if they continue or are bothersome):  aches, pains  constipation  diarrhea  headache  hot flashes  nausea, vomiting  pain at site where injected  stomach pain This list may not describe all possible side effects. Call your doctor for medical advice about side effects. You may report side effects to FDA at 1-800-FDA-1088. Where should I keep my medicine? This drug is given in a hospital or clinic and will  not be stored at home. NOTE: This sheet is a summary. It may not cover all possible information. If you have questions about this medicine, talk to your doctor, pharmacist, or health care provider.  2021 [employer]/Gold Standard (2017-10-23 11:34:41) Denosumab injection What is this  medicine? DENOSUMAB (den oh sue mab) slows bone breakdown. Prolia is used to treat osteoporosis in women after menopause and in men, and in people who are taking corticosteroids for 6 months or more. Xgeva is used to treat a high calcium level due to cancer and to prevent bone fractures and other bone problems caused by multiple myeloma or cancer bone metastases. Xgeva is also used to treat giant cell tumor of the bone. This medicine may be used for other purposes; ask your health care provider or pharmacist if you have questions. COMMON BRAND NAME(S): Prolia, XGEVA What should I tell my health care provider before I take this medicine? They need to know if you have any of these conditions:  dental disease  having surgery or tooth extraction  infection  kidney disease  low levels of calcium or Vitamin D in the blood  malnutrition  on hemodialysis  skin conditions or sensitivity  thyroid or parathyroid disease  an unusual reaction to denosumab, other medicines, foods, dyes, or preservatives  pregnant or trying to get pregnant  breast-feeding How should I use this medicine? This medicine is for injection under the skin. It is given by a health care professional in a hospital or clinic setting. A special MedGuide will be given to you before each treatment. Be sure to read this information carefully each time. For Prolia, talk to your pediatrician regarding the use of this medicine in children. Special care may be needed. For Xgeva, talk to your pediatrician regarding the use of this medicine in children. While this drug may be prescribed for children as young as 13 years for selected conditions, precautions do apply. Overdosage: If you think you have taken too much of this medicine contact a poison control center or emergency room at once. NOTE: This medicine is only for you. Do not share this medicine with others. What if I miss a dose? It is important not to miss your dose. Call  your doctor or health care professional if you are unable to keep an appointment. What may interact with this medicine? Do not take this medicine with any of the following medications:  other medicines containing denosumab This medicine may also interact with the following medications:  medicines that lower your chance of fighting infection  steroid medicines like prednisone or cortisone This list may not describe all possible interactions. Give your health care provider a list of all the medicines, herbs, non-prescription drugs, or dietary supplements you use. Also tell them if you smoke, drink alcohol, or use illegal drugs. Some items may interact with your medicine. What should I watch for while using this medicine? Visit your doctor or health care professional for regular checks on your progress. Your doctor or health care professional may order blood tests and other tests to see how you are doing. Call your doctor or health care professional for advice if you get a fever, chills or sore throat, or other symptoms of a cold or flu. Do not treat yourself. This drug may decrease your body's ability to fight infection. Try to avoid being around people who are sick. You should make sure you get enough calcium and vitamin D while you are taking this medicine, unless your doctor tells   you not to. Discuss the foods you eat and the vitamins you take with your health care professional. See your dentist regularly. Brush and floss your teeth as directed. Before you have any dental work done, tell your dentist you are receiving this medicine. Do not become pregnant while taking this medicine or for 5 months after stopping it. Talk with your doctor or health care professional about your birth control options while taking this medicine. Women should inform their doctor if they wish to become pregnant or think they might be pregnant. There is a potential for serious side effects to an unborn child. Talk to your  health care professional or pharmacist for more information. What side effects may I notice from receiving this medicine? Side effects that you should report to your doctor or health care professional as soon as possible:  allergic reactions like skin rash, itching or hives, swelling of the face, lips, or tongue  bone pain  breathing problems  dizziness  jaw pain, especially after dental work  redness, blistering, peeling of the skin  signs and symptoms of infection like fever or chills; cough; sore throat; pain or trouble passing urine  signs of low calcium like fast heartbeat, muscle cramps or muscle pain; pain, tingling, numbness in the hands or feet; seizures  unusual bleeding or bruising  unusually weak or tired Side effects that usually do not require medical attention (report to your doctor or health care professional if they continue or are bothersome):  constipation  diarrhea  headache  joint pain  loss of appetite  muscle pain  runny nose  tiredness  upset stomach This list may not describe all possible side effects. Call your doctor for medical advice about side effects. You may report side effects to FDA at 1-800-FDA-1088. Where should I keep my medicine? This medicine is only given in a clinic, doctor's office, or other health care setting and will not be stored at home. NOTE: This sheet is a summary. It may not cover all possible information. If you have questions about this medicine, talk to your doctor, pharmacist, or health care provider.  2021 [employer]/Gold Standard (2017-11-21 16:10:44)  

## 2020-08-23 ENCOUNTER — Telehealth: Payer: Self-pay | Admitting: Oncology

## 2020-08-23 LAB — CANCER ANTIGEN 27.29: CA 27.29: 395.9 U/mL — ABNORMAL HIGH (ref 0.0–38.6)

## 2020-08-23 NOTE — Telephone Encounter (Signed)
Scheduled appts per 1/25 los. Pt confirmed appt date and time.  

## 2020-09-11 NOTE — Telephone Encounter (Signed)
Patient changed to [facility] from [employer].

## 2020-09-18 NOTE — Progress Notes (Signed)
[place]  [phone] [phone]) [phone]    ID: Barbara Parks DOB: 04-18-1951  MR#: [id number]  VHQ#:[id number]  Patient Care Team: [name], MD as PCP - General (Family Medicine) Jerline Pain, MD (Cardiology) Gaye Pollack, MD (Cardiothoracic Surgery) Madoc Holquin, Virgie Dad, MD as Consulting Physician (Oncology) OTHER MD: Dr Erroll Luna- Merlene Laughter, DDS; Gaynelle Arabian MD   CHIEF COMPLAINT: metastatic breast cancer, formerly on BOLERO study (s/p right mastectomy)  CURRENT TREATMENT: Fulvestrant, ribociclib, denosumab/Xgeva   INTERVAL HISTORY: Barbara Parks returns today for follow-up and treatment of her metastatic breast cancer.   She was switched to ribociclib at her last visit on 08/22/2020. She feels very tired the first week although better the second. She tells me she has had a decreased appetite, although her weight remained stable. She has had no rash, and no nausea. She has had slight diarrhea towards the end of the first cycle, a couple of loose bowel movements in the last couple of days of treatment. She "picked up her energy" on the off week and is starting cycle #2 on 09/21/2020.  She continues on fulvestrant every 4 weeks and is tolerating this quite well.  She is also receiving xgeva every 4 weeks with no issues.  She has no dental concners today.    She is up to date on mammography, most recently on 04/13/2020.    REVIEW OF SYSTEMS: [place] continues to be normally active and is waiting for good weather to get back into golf   COVID 19 VACCINATION STATUS: Refuses vaccination   BREAST CANCER HISTORY: From Dr. Collier Barbara Parks's original intake note 04/13/2004:  "This woman has been in good health all of her life. She recently moved from [place] to work here.  She palpated a mass at about the 12 o'clock position in mid-July. She has not noticed any nipple retraction or skin changes.  She was seen by her primary care doctor who subsequently  referred her for a mammogram.  Mammogram was performed on 03/01/04. This demonstrated a spiculated 2 cm mass at the 12 o'clock position in the right breast.  Upper outer quadrant of the left breast shows some distortion.   Physical exam at that time showed a firm, nontender nodule at the 12 o'clock position in the right breast, 5 cm from the nipple.  Ultrasound of this area showed a hypoechoic ill-defined mass, measuring 2.2 x 1.3 x 1.4 cm.  Physical exam of the left breast showed general vague thickening, upper outer quadrant of the left breast with a discrete palpable mass. The ultrasound performed showed a single hypoechoic ill-defined nodule at the 1 o'clock position, measuring 7 x 5 x 6 mm.    She had biopsies of both lesions on 03/02/03.  Parks core biopsy of the lesion on the right breast revealed invasive mammary carcinoma.  Parks core biopsy of the left breast showed a complex fibroadenoma.  Prognostic panel of the lesion on the right breast showed it to be ER positive at 73%, PR positive at 90% and proliferative index 9%, HER-2 was 1+.  Patient was referred to Dr. Annamaria Boots, who performed a simple mastectomy with sentinel lymph node evaluation on 03/22/04.  Final pathology showed this to be an invasive ductal carcinoma  with lobular features, measuring 2.2 cm, grade 2 of 3.  Margins negative for carcinoma.  Invasive ductal carcinoma was extended to involve deep dermis of the nipple.  Lymphovascular invasion was identified.  Total of 4 sentinel lymph nodes were evaluated.  Touch imprints at the time of the OR was felt to be negative.  Subsequent evaluation showed a 5 mm focus of metastatic carcinoma in one of the four lymph nodes on microscopic after sectioning.  There was extracapsular extension  of one lymph node as well. The remaining three lymph nodes were all negative."  The patient's subsequent history is detailed above.   PAST MEDICAL HISTORY: Past Medical History:  Diagnosis Date  . Arthritis     left hip  . Asthma   . breast ca 2005   breast/chemo R mastectomy  . Dermatitis   . Diabetes mellitus without complication ([place])   . GERD (gastroesophageal reflux disease)   . Hypercholesteremia   . Metastasis to lung ([place]) dx'd 08/2011  . Osteopenia due to cancer therapy 09/09/2013  . Palpitations   . Personal history of chemotherapy   . Shortness of breath     PAST SURGICAL HISTORY: Past Surgical History:  Procedure Laterality Date  . BREAST SURGERY  2005   right  . CATARACT EXTRACTION W/PHACO Left 01/18/2014   Procedure: CATARACT EXTRACTION PHACO AND INTRAOCULAR LENS PLACEMENT (IOC);  Surgeon: Elta Guadeloupe T. Gershon Crane, MD;  Location: [facility];  Service: Ophthalmology;  Laterality: Left;  CDE 15.79  . CATARACT EXTRACTION W/PHACO Right 02/08/2014   Procedure: CATARACT EXTRACTION PHACO AND INTRAOCULAR LENS PLACEMENT (IOC);  Surgeon: Elta Guadeloupe T. Gershon Crane, MD;  Location: [facility];  Service: Ophthalmology;  Laterality: Right;  CDE 4.41  . CHEST TUBE INSERTION  09/11/2011   Procedure: INSERTION PLEURAL DRAINAGE CATHETER;  Surgeon: Gaye Pollack, MD;  Location: [facility];  Service: Thoracic;  Laterality: Right;  . MASTECTOMY Right   . PERICARDIAL WINDOW  09/11/2011   Procedure: PERICARDIAL WINDOW;  Surgeon: Gaye Pollack, MD;  [facility];  Service: Thoracic;  Laterality: N/A;  . REMOVAL OF PLEURAL DRAINAGE CATHETER  12/19/2011   Procedure: REMOVAL OF PLEURAL DRAINAGE CATHETER;  Surgeon: Gaye Pollack, MD;  Location: [facility];  Service: Thoracic;  Laterality: Right;  TO BE DONE IN MINOR ROOM, SHORT STAY  . [place]  . TOTAL HIP ARTHROPLASTY Left 06/07/2015   Procedure: LEFT TOTAL HIP ARTHROPLASTY ANTERIOR APPROACH;  Surgeon: Gaynelle Arabian, MD;  Location: [facility];  Service: Orthopedics;  Laterality: Left;  Marland Kitchen VIDEO BRONCHOSCOPY  09/11/2011   Procedure: VIDEO BRONCHOSCOPY;  Surgeon: Gaye Pollack, MD;  Location: [facility] OR;  Service: Thoracic;  Laterality: N/A;    FAMILY HISTORY Family History  Problem  Relation Age of Onset  . Cancer Mother        breast  . Heart disease Father   . Diabetes Other   . Anesthesia problems Neg Hx   The patient's mother was diagnosed with breast cancer at age 75, she died age 13 from congestive heart failure.The patient's father died from heart disease at age 70.  She has one sister alive & well.  Two brothers alive & well, one with diabetes. The patient's sister was also diagnosed with breast cancer, and was tested for the BRCA gene, and was negative. The patient herself has not been tested. There is no history of ovarian cancer in the family   GYNECOLOGIC HISTORY:  No LMP recorded. Patient is postmenopausal. Menarche age 82, the patient is GX P0. She stopped having periods with chemotherapy in 2005. 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 She was  a combat still Geophysicist/field seismologist. She is single, lives alone with her Shitzu-poodle Sammie.. Family is all in the [place] area.     ADVANCED DIRECTIVES: Not in place. At the 02/23/2014 visit the patient was given the appropriate documents to complete and notarize at her discretion. She tells me she is planning to name her sister, Billie Ruddy, as healthcare power of attorney. [name] can be reached at [phone]   HEALTH MAINTENANCE: Social History   Tobacco Use  . Smoking status: Former Smoker    Packs/day: 1.50    Years: 30.00    Pack years: 45.00    Types: Cigarettes    Quit date: 09/10/2007    Years since quitting: 13.0  . Smokeless tobacco: Never Used  Substance Use Topics  . Alcohol use: No  . Drug use: No    Colonoscopy:  PAP:  Bone density: 04/09/2018, T score -1.8  Lipid panel:  Allergies  Allergen Reactions  . Aspirin     REACTION: upset stomach  . Latex Other (See Comments)    Blistering and skin peels off  . Nsaids Nausea And Vomiting    Extreme nausea and vomiting    Current  Outpatient Medications  Medication Sig Dispense Refill  . cholecalciferol (VITAMIN D3) 25 MCG (1000 UT) tablet Take 2 tablets (2,000 Units total) by mouth daily. 180 tablet 4  . cyclobenzaprine (FLEXERIL) 10 MG tablet Take 1 tablet (10 mg total) by mouth 2 (two) times daily as needed for muscle spasms. 20 tablet 0  . gemfibrozil (LOPID) 600 MG tablet TAKE 1 TABLET (600 MG TOTAL) BY MOUTH 2 (TWO) TIMES DAILY BEFORE A MEAL. 180 tablet 1  . loperamide (IMODIUM) 2 MG capsule Take 2 mg by mouth 4 (four) times daily as needed for diarrhea or loose stools. Reported on 11/30/2015    . metFORMIN (GLUCOPHAGE-XR) 500 MG 24 hr tablet Take 1 tablet (500 mg total) by mouth 3 (three) times daily before meals.  3  . prochlorperazine (COMPAZINE) 10 MG tablet Take 1 tablet (10 mg total) by mouth every 8 (eight) hours as needed for nausea or vomiting. 30 tablet 0  . ribociclib succ (KISQALI 600MG DAILY DOSE) 200 MG Therapy Pack Take 3 tablets (600 mg total) by mouth daily. Take for 21 days on, 7 days off, repeat every 28 days. Start 08/23/2020 63 tablet 6  . traMADol (ULTRAM) 50 MG tablet Take 0.5-1 tablets (25-50 mg total) by mouth every 6 (six) hours as needed. 30 tablet 0   No current facility-administered medications for this visit.    OBJECTIVE: White woman who appears younger than stated age 35:   09/19/20 1251  BP: 102/70  Pulse: (!) 107  Resp: 18  Temp: 97.7 F (36.5 C)  SpO2: 95%   Wt Readings from Last 3 Encounters:  09/19/20 156 lb 12.8 oz (71.1 kg)  08/22/20 156 lb 8 oz (71 kg)  07/26/20 159 lb 9.6 oz (72.4 kg)   Body mass index is 26.5 kg/m.    ECOG FS:1 - Symptomatic but completely ambulatory  Sclerae unicteric, EOMs intact Wearing a mask No cervical or supraclavicular adenopathy Lungs no rales or rhonchi Heart regular rate and rhythm Abd soft, nontender, positive bowel sounds MSK no focal spinal tenderness, no upper extremity lymphedema Neuro: nonfocal, well oriented,  appropriate affect Breasts: Status post right mastectomy with no evidence of local recurrence. The left breast and both axillae are benign    LAB  RESULTS:  CMP     Component Value  Date/Time   NA 139 09/19/2020 1211   NA 139 07/08/2017 0942   K 4.2 09/19/2020 1211   K 3.8 07/08/2017 0942   CL 106 09/19/2020 1211   CL 109 (H) 12/31/2012 0940   CO2 23 09/19/2020 1211   CO2 18 (L) 07/08/2017 0942   GLUCOSE 120 (H) 09/19/2020 1211   GLUCOSE 156 (H) 07/08/2017 0942   GLUCOSE 127 (H) 12/31/2012 0940   BUN 23 09/19/2020 1211   BUN 17.7 07/08/2017 0942   CREATININE 1.36 (H) 09/19/2020 1211   CREATININE 1.18 (H) 12/25/2017 0959   CREATININE 1.0 07/08/2017 0942   CALCIUM 9.8 09/19/2020 1211   CALCIUM 10.4 07/08/2017 0942   PROT 7.8 09/19/2020 1211   PROT 7.7 07/08/2017 0942   ALBUMIN 3.6 09/19/2020 1211   ALBUMIN 3.4 (L) 07/08/2017 0942   AST 14 (L) 09/19/2020 1211   AST 50 (H) 12/25/2017 0959   AST 54 (H) 07/08/2017 0942   ALT 11 09/19/2020 1211   ALT 53 12/25/2017 0959   ALT 39 07/08/2017 0942   ALKPHOS 103 09/19/2020 1211   ALKPHOS 232 (H) 07/08/2017 0942   BILITOT 0.2 (L) 09/19/2020 1211   BILITOT 0.3 12/25/2017 0959   BILITOT 0.42 07/08/2017 0942   GFRNONAA 42 (L) 09/19/2020 1211   GFRNONAA 47 (L) 12/25/2017 0959   GFRAA 49 (L) 04/05/2020 1504   GFRAA 54 (L) 12/25/2017 0959    I No results found for: SPEP  Lab Results  Component Value Date   WBC 4.5 09/19/2020   NEUTROABS 2.5 09/19/2020   HGB 13.9 09/19/2020   HCT 42.8 09/19/2020   MCV 104.9 (H) 09/19/2020   PLT 141 (L) 09/19/2020      Chemistry      Component Value Date/Time   NA 139 09/19/2020 1211   NA 139 07/08/2017 0942   K 4.2 09/19/2020 1211   K 3.8 07/08/2017 0942   CL 106 09/19/2020 1211   CL 109 (H) 12/31/2012 0940   CO2 23 09/19/2020 1211   CO2 18 (L) 07/08/2017 0942   BUN 23 09/19/2020 1211   BUN 17.7 07/08/2017 0942   CREATININE 1.36 (H) 09/19/2020 1211   CREATININE 1.18 (H)  12/25/2017 0959   CREATININE 1.0 07/08/2017 0942      Component Value Date/Time   CALCIUM 9.8 09/19/2020 1211   CALCIUM 10.4 07/08/2017 0942   ALKPHOS 103 09/19/2020 1211   ALKPHOS 232 (H) 07/08/2017 0942   AST 14 (L) 09/19/2020 1211   AST 50 (H) 12/25/2017 0959   AST 54 (H) 07/08/2017 0942   ALT 11 09/19/2020 1211   ALT 53 12/25/2017 0959   ALT 39 07/08/2017 0942   BILITOT 0.2 (L) 09/19/2020 1211   BILITOT 0.3 12/25/2017 0959   BILITOT 0.42 07/08/2017 0942       Lab Results  Component Value Date   LABCA2 58 (H) 09/14/2012    No components found for: [id number]  No results for input(s): INR in the last 168 hours.  Urinalysis    Component Value Date/Time   COLORURINE YELLOW 11/25/2017 0901   APPEARANCEUR HAZY (A) 11/25/2017 0901   LABSPEC 1.017 11/25/2017 0901   LABSPEC 1.030 07/13/2015 0841   PHURINE 5.0 11/25/2017 0901   GLUCOSEU NEGATIVE 11/25/2017 0901   GLUCOSEU Negative 07/13/2015 0841   HGBUR MODERATE (A) 11/25/2017 0901   BILIRUBINUR NEGATIVE 11/25/2017 0901   BILIRUBINUR Negative 07/13/2015 0841   KETONESUR NEGATIVE 11/25/2017 0901   PROTEINUR 100 (A) 11/25/2017 0901   UROBILINOGEN 0.2  07/13/2015 0841   NITRITE NEGATIVE 11/25/2017 0901   LEUKOCYTESUR NEGATIVE 11/25/2017 0901   LEUKOCYTESUR Negative 07/13/2015 0841    STUDIES: No results found.   ASSESSMENT: 70 y.o. [place], [place] woman with stage IV breast cancer, on BOLERO-4 trial  (1) status post right mastectomy and sentinel lymph node sampling 03/22/2004 for a right upper-outer quadrant pT2 pN1, stage IIB invasive ductal carcinoma with lobular features, grade 2, estrogen receptor and progesterone receptor positive, HER-2 negative, with an MIB-1 of 9% ([id number] and [id number])  (2) addition all right axillary lymph node sampling 05/07/2004 showed 2 benign lymph nodes (4 lymph nodes previously removed, so total was one positive lymph node out of 6; [id number])  (3) the patient was evaluated by radiation  oncology; no postmastectomy radiation recommended  (4) adjuvant chemotherapy with dose dense doxorubicin and cyclophosphamide x4 cycles (first cycle delayed one week) followed by dose dense paclitaxel x4 was completed 09/18/2004  (5) tamoxifen started March 2006, discontinued 2009  METASTATIC DISEASE: February 2013 (6) presenting with a large pericardial effusion, large right pleural effusion and possible right middle lobe bronchial obstruction February 2013, status post pericardial window placement, fiberoptic bronchoscopy and right Pleurx placement 09/11/2011, with biopsy of the bronchus intermedius and pericardium positive for metastatic breast cancer, estrogen receptor 91% positive with moderate staining intensity, progesterone receptor 100% positive with strong staining intensity, with an MIB-1 of 35%, and no HER-2 amplification, the signals ratio being 1.37 (SZA [id number])  (7) enrolled in BOLERO-4 trial 10/10/2011, receiving letrozole and everolimus  (a) two small areas of enhancement in the cerebellum noted by brain MRI 10/03/2011 were no longer apparent on repeat MRI 08/21/2012-- most recent brain MRI 04/01/2014 showed no evidence of intracranial metastatic disease  (b) sclerotic lesions in left iliac bone and sacrum have not been biopsied; stable; to start zolendronate after patient updates her dental care (extraction planned)--never started  (c) RLL lung nodule, stable (rescanned 07/12/2014 and 08/11/2014) R hilar and subcarinal lymph nodes: stable  (d) CT of the chest:03/25/2018, stable  (e) CT Angio 12/12/2018 stable  (f) off BOLERO trial (trial ended) as of July 2020, but continuing on study drugs  (g) letrozole and everolimus discontinued January 2021 with disease progression  (8) additional problems:  (a) hepatic steatosis  (b) COPD/ emphysema/ asthma  (c) advanced L hip osteoarthritis, status post left total hip replacement 06/07/2015  (d) aortoiliac atherosclerosis  (e) dental  evaluation pending w possible dental extractions  (f) likely thalassemia trait  (g) hyperlipidemia  (9) Bone density concerns: DEXA scan 08/11/2013 was normal  (a) repeat DEXA scan 04/09/2018 shows a T score of -1.8, osteopenia  (10) disease monitoring:  (a) PET scan 12/22/2018 shows no hypermetabolic soft tissue metastases but multifocal bony metastasis  (b) denosumab/Xgeva started on 02/25/2019, repeated every 28 days (delayed due to dental extraction healing issues)  (c) bone scan 05/13/2019 serves as baseline study with increased activity in the right scapular, lower ribs and right femoral head (stable compared to PET scan of May 2020  (d) bone scan 07/09/2019 stable  (e) CT chest 07/09/2019 shows stable lung lesions but emerging left liver lobe 0.7 cm lesion  (f) liver MRI 08/03/2019 shows approximately 8 liver lesions, the largest measuring 1.8 cm.  (g) liver biopsy 08/06/2019 confirms metastatic breast cancer, estrogen and progesterone receptor positive, with an MIB-1 of 5% and HER-2 not amplified  (h) chest CT and bone scan 11/11/2019 essentially stable  (i) liver MRI 02/01/2020 stable to improved  (j) chest CT and  bone scan 04/26/2020 stable  (11) denosumab/Xgeva begun 02/25/2019 repeated every 4 weeks  (12) fulvestrant started 08/24/2019  (a) palbociclib 125 mg daily 21 days on 7 days off started 08/25/2019  (b) chest CT 07/24/2020 stable  (c) liver MRI 07/25/2020 shows mild progression in measurable disease  (d) palbociclib discontinued and ribociclib substituted starting 08/22/2020   (i) QTc on 08/22/2020 was 430   (ii) QTc    PLAN: Vibha did moderately well with her first cycle of ribociclib. She tells me she expects some side effects and that she can manage the side effects. The question of course is whether this will work for her or not. We have a CA 27-29 pending today and we will repeat one next time. If we see significant evidence of response then probably we will  continue the ribociclib but I would lower her dose to see if we can make it easier for her in the longer run.  On the other hand if we do see continuing rise in the CA 27-29 we will probably stop the ribociclib, restage, and change to a different treatment.  Accordingly I have asked her to not reorder the cycle 3 ribociclib until after she sees me 4 weeks from today.  She knows to call for any other issue that may develop before the next visit  Total encounter time 25 minutes.Sarajane Jews C. Jones Viviani, MD 09/19/20 6:07 PM Medical Oncology and Hematology [facility] [place], [place] [address] Tel. [phone]    Fax. [phone]   I, Wilburn Mylar, am acting as scribe for Dr. Virgie Dad. Han Lysne.  I, Lurline Del MD, have reviewed the above documentation for accuracy and completeness, and I agree with the above.   *Total Encounter Time as defined by the Centers for Medicare and Medicaid Services includes, in addition to the face-to-face time of a patient visit (documented in the note above) non-face-to-face time: obtaining and reviewing outside history, ordering and reviewing medications, tests or procedures, care coordination (communications with other health care professionals or caregivers) and documentation in the medical record.

## 2020-09-19 ENCOUNTER — Inpatient Hospital Stay: Payer: Medicare Other | Attending: Oncology

## 2020-09-19 ENCOUNTER — Inpatient Hospital Stay (HOSPITAL_BASED_OUTPATIENT_CLINIC_OR_DEPARTMENT_OTHER): Payer: Medicare Other | Admitting: Oncology

## 2020-09-19 ENCOUNTER — Inpatient Hospital Stay: Payer: Medicare Other

## 2020-09-19 ENCOUNTER — Other Ambulatory Visit: Payer: Self-pay

## 2020-09-19 VITALS — BP 102/70 | HR 107 | Temp 97.7°F | Resp 18 | Ht 64.5 in | Wt 156.8 lb

## 2020-09-19 DIAGNOSIS — C50411 Malignant neoplasm of upper-outer quadrant of right female breast: Secondary | ICD-10-CM | POA: Insufficient documentation

## 2020-09-19 DIAGNOSIS — Z79899 Other long term (current) drug therapy: Secondary | ICD-10-CM | POA: Diagnosis not present

## 2020-09-19 DIAGNOSIS — C787 Secondary malignant neoplasm of liver and intrahepatic bile duct: Secondary | ICD-10-CM

## 2020-09-19 DIAGNOSIS — C7800 Secondary malignant neoplasm of unspecified lung: Secondary | ICD-10-CM

## 2020-09-19 DIAGNOSIS — C7951 Secondary malignant neoplasm of bone: Secondary | ICD-10-CM | POA: Insufficient documentation

## 2020-09-19 DIAGNOSIS — C801 Malignant (primary) neoplasm, unspecified: Secondary | ICD-10-CM

## 2020-09-19 DIAGNOSIS — I3131 Malignant pericardial effusion in diseases classified elsewhere: Secondary | ICD-10-CM

## 2020-09-19 DIAGNOSIS — Z170 Estrogen receptor positive status [ER+]: Secondary | ICD-10-CM | POA: Diagnosis not present

## 2020-09-19 DIAGNOSIS — Z5111 Encounter for antineoplastic chemotherapy: Secondary | ICD-10-CM | POA: Insufficient documentation

## 2020-09-19 DIAGNOSIS — C50911 Malignant neoplasm of unspecified site of right female breast: Secondary | ICD-10-CM

## 2020-09-19 DIAGNOSIS — I313 Pericardial effusion (noninflammatory): Secondary | ICD-10-CM | POA: Diagnosis not present

## 2020-09-19 LAB — CBC WITH DIFFERENTIAL/PLATELET
Abs Immature Granulocytes: 0.02 10*3/uL (ref 0.00–0.07)
Basophils Absolute: 0.1 10*3/uL (ref 0.0–0.1)
Basophils Relative: 2 %
Eosinophils Absolute: 0 10*3/uL (ref 0.0–0.5)
Eosinophils Relative: 0 %
HCT: 42.8 % (ref 36.0–46.0)
Hemoglobin: 13.9 g/dL (ref 12.0–15.0)
Immature Granulocytes: 0 %
Lymphocytes Relative: 22 %
Lymphs Abs: 1 10*3/uL (ref 0.7–4.0)
MCH: 34.1 pg — ABNORMAL HIGH (ref 26.0–34.0)
MCHC: 32.5 g/dL (ref 30.0–36.0)
MCV: 104.9 fL — ABNORMAL HIGH (ref 80.0–100.0)
Monocytes Absolute: 0.9 10*3/uL (ref 0.1–1.0)
Monocytes Relative: 19 %
Neutro Abs: 2.5 10*3/uL (ref 1.7–7.7)
Neutrophils Relative %: 57 %
Platelets: 141 10*3/uL — ABNORMAL LOW (ref 150–400)
RBC: 4.08 MIL/uL (ref 3.87–5.11)
RDW: 14.3 % (ref 11.5–15.5)
WBC: 4.5 10*3/uL (ref 4.0–10.5)
nRBC: 0 % (ref 0.0–0.2)

## 2020-09-19 LAB — COMPREHENSIVE METABOLIC PANEL
ALT: 11 U/L (ref 0–44)
AST: 14 U/L — ABNORMAL LOW (ref 15–41)
Albumin: 3.6 g/dL (ref 3.5–5.0)
Alkaline Phosphatase: 103 U/L (ref 38–126)
Anion gap: 10 (ref 5–15)
BUN: 23 mg/dL (ref 8–23)
CO2: 23 mmol/L (ref 22–32)
Calcium: 9.8 mg/dL (ref 8.9–10.3)
Chloride: 106 mmol/L (ref 98–111)
Creatinine, Ser: 1.36 mg/dL — ABNORMAL HIGH (ref 0.44–1.00)
GFR, Estimated: 42 mL/min — ABNORMAL LOW (ref >60)
Glucose, Bld: 120 mg/dL — ABNORMAL HIGH (ref 70–99)
Potassium: 4.2 mmol/L (ref 3.5–5.1)
Sodium: 139 mmol/L (ref 135–145)
Total Bilirubin: 0.2 mg/dL — ABNORMAL LOW (ref 0.3–1.2)
Total Protein: 7.8 g/dL (ref 6.5–8.1)

## 2020-09-19 MED ORDER — FULVESTRANT 250 MG/5ML IM SOLN
500.0000 mg | Freq: Once | INTRAMUSCULAR | Status: AC
Start: 2020-09-19 — End: 2020-09-19
  Administered 2020-09-19: 500 mg via INTRAMUSCULAR

## 2020-09-19 MED ORDER — DENOSUMAB 120 MG/1.7ML ~~LOC~~ SOLN
SUBCUTANEOUS | Status: AC
  Filled 2020-09-19: qty 1.7

## 2020-09-19 MED ORDER — FULVESTRANT 250 MG/5ML IM SOLN
INTRAMUSCULAR | Status: AC
  Filled 2020-09-19: qty 10

## 2020-09-19 MED ORDER — DENOSUMAB 120 MG/1.7ML ~~LOC~~ SOLN
120.0000 mg | Freq: Once | SUBCUTANEOUS | Status: AC
  Administered 2020-09-19: 120 mg via SUBCUTANEOUS

## 2020-09-19 NOTE — Patient Instructions (Signed)
Fulvestrant injection What is this medicine? FULVESTRANT (ful VES trant) blocks the effects of estrogen. It is used to treat breast cancer. This medicine may be used for other purposes; ask your health care provider or pharmacist if you have questions. COMMON BRAND NAME(S): FASLODEX What should I tell my health care provider before I take this medicine? They need to know if you have any of these conditions:  bleeding disorders  liver disease  low blood counts, like low white cell, platelet, or red cell counts  an unusual or allergic reaction to fulvestrant, other medicines, foods, dyes, or preservatives  pregnant or trying to get pregnant  breast-feeding How should I use this medicine? This medicine is for injection into a muscle. It is usually given by a health care professional in a hospital or clinic setting. Talk to your pediatrician regarding the use of this medicine in children. Special care may be needed. Overdosage: If you think you have taken too much of this medicine contact a poison control center or emergency room at once. NOTE: This medicine is only for you. Do not share this medicine with others. What if I miss a dose? It is important not to miss your dose. Call your doctor or health care professional if you are unable to keep an appointment. What may interact with this medicine?  medicines that treat or prevent blood clots like warfarin, enoxaparin, dalteparin, apixaban, dabigatran, and rivaroxaban This list may not describe all possible interactions. Give your health care provider a list of all the medicines, herbs, non-prescription drugs, or dietary supplements you use. Also tell them if you smoke, drink alcohol, or use illegal drugs. Some items may interact with your medicine. What should I watch for while using this medicine? Your condition will be monitored carefully while you are receiving this medicine. You will need important blood work done while you are taking  this medicine. Do not become pregnant while taking this medicine or for at least 1 year after stopping it. Women of child-bearing potential will need to have a negative pregnancy test before starting this medicine. Women should inform their doctor if they wish to become pregnant or think they might be pregnant. There is a potential for serious side effects to an unborn child. Men should inform their doctors if they wish to father a child. This medicine may lower sperm counts. Talk to your health care professional or pharmacist for more information. Do not breast-feed an infant while taking this medicine or for 1 year after the last dose. What side effects may I notice from receiving this medicine? Side effects that you should report to your doctor or health care professional as soon as possible:  allergic reactions like skin rash, itching or hives, swelling of the face, lips, or tongue  feeling faint or lightheaded, falls  pain, tingling, numbness, or weakness in the legs  signs and symptoms of infection like fever or chills; cough; flu-like symptoms; sore throat  vaginal bleeding Side effects that usually do not require medical attention (report to your doctor or health care professional if they continue or are bothersome):  aches, pains  constipation  diarrhea  headache  hot flashes  nausea, vomiting  pain at site where injected  stomach pain This list may not describe all possible side effects. Call your doctor for medical advice about side effects. You may report side effects to FDA at 1-800-FDA-1088. Where should I keep my medicine? This drug is given in a hospital or clinic and will  not be stored at home. NOTE: This sheet is a summary. It may not cover all possible information. If you have questions about this medicine, talk to your doctor, pharmacist, or health care provider.  2021 [employer] Standard (2017-10-23 11:34:41) Denosumab injection What is this  medicine? DENOSUMAB (den oh sue mab) slows bone breakdown. Prolia is used to treat osteoporosis in women after menopause and in men, and in people who are taking corticosteroids for 6 months or more. Delton See is used to treat a high calcium level due to cancer and to prevent bone fractures and other bone problems caused by multiple myeloma or cancer bone metastases. Delton See is also used to treat giant cell tumor of the bone. This medicine may be used for other purposes; ask your health care provider or pharmacist if you have questions. COMMON BRAND NAME(S): Prolia, XGEVA What should I tell my health care provider before I take this medicine? They need to know if you have any of these conditions:  dental disease  having surgery or tooth extraction  infection  kidney disease  low levels of calcium or Vitamin D in the blood  malnutrition  on hemodialysis  skin conditions or sensitivity  thyroid or parathyroid disease  an unusual reaction to denosumab, other medicines, foods, dyes, or preservatives  pregnant or trying to get pregnant  breast-feeding How should I use this medicine? This medicine is for injection under the skin. It is given by a health care professional in a hospital or clinic setting. A special MedGuide will be given to you before each treatment. Be sure to read this information carefully each time. For Prolia, talk to your pediatrician regarding the use of this medicine in children. Special care may be needed. For Delton See, talk to your pediatrician regarding the use of this medicine in children. While this drug may be prescribed for children as young as 13 years for selected conditions, precautions do apply. Overdosage: If you think you have taken too much of this medicine contact a poison control center or emergency room at once. NOTE: This medicine is only for you. Do not share this medicine with others. What if I miss a dose? It is important not to miss your dose. Call  your doctor or health care professional if you are unable to keep an appointment. What may interact with this medicine? Do not take this medicine with any of the following medications:  other medicines containing denosumab This medicine may also interact with the following medications:  medicines that lower your chance of fighting infection  steroid medicines like prednisone or cortisone This list may not describe all possible interactions. Give your health care provider a list of all the medicines, herbs, non-prescription drugs, or dietary supplements you use. Also tell them if you smoke, drink alcohol, or use illegal drugs. Some items may interact with your medicine. What should I watch for while using this medicine? Visit your doctor or health care professional for regular checks on your progress. Your doctor or health care professional may order blood tests and other tests to see how you are doing. Call your doctor or health care professional for advice if you get a fever, chills or sore throat, or other symptoms of a cold or flu. Do not treat yourself. This drug may decrease your body's ability to fight infection. Try to avoid being around people who are sick. You should make sure you get enough calcium and vitamin D while you are taking this medicine, unless your doctor tells  you not to. Discuss the foods you eat and the vitamins you take with your health care professional. See your dentist regularly. Brush and floss your teeth as directed. Before you have any dental work done, tell your dentist you are receiving this medicine. Do not become pregnant while taking this medicine or for 5 months after stopping it. Talk with your doctor or health care professional about your birth control options while taking this medicine. Women should inform their doctor if they wish to become pregnant or think they might be pregnant. There is a potential for serious side effects to an unborn child. Talk to your  health care professional or pharmacist for more information. What side effects may I notice from receiving this medicine? Side effects that you should report to your doctor or health care professional as soon as possible:  allergic reactions like skin rash, itching or hives, swelling of the face, lips, or tongue  bone pain  breathing problems  dizziness  jaw pain, especially after dental work  redness, blistering, peeling of the skin  signs and symptoms of infection like fever or chills; cough; sore throat; pain or trouble passing urine  signs of low calcium like fast heartbeat, muscle cramps or muscle pain; pain, tingling, numbness in the hands or feet; seizures  unusual bleeding or bruising  unusually weak or tired Side effects that usually do not require medical attention (report to your doctor or health care professional if they continue or are bothersome):  constipation  diarrhea  headache  joint pain  loss of appetite  muscle pain  runny nose  tiredness  upset stomach This list may not describe all possible side effects. Call your doctor for medical advice about side effects. You may report side effects to FDA at 1-800-FDA-1088. Where should I keep my medicine? This medicine is only given in a clinic, doctor's office, or other health care setting and will not be stored at home. NOTE: This sheet is a summary. It may not cover all possible information. If you have questions about this medicine, talk to your doctor, pharmacist, or health care provider.  2021 [employer] Standard (2017-11-21 16:10:44)  

## 2020-09-20 ENCOUNTER — Telehealth: Payer: Self-pay | Admitting: Oncology

## 2020-09-20 LAB — CANCER ANTIGEN 27.29: CA 27.29: 355.5 U/mL — ABNORMAL HIGH (ref 0.0–38.6)

## 2020-09-20 NOTE — Telephone Encounter (Signed)
Scheduled appts per 2/22 los. Pt confirmed appt date/time.

## 2020-10-16 NOTE — Progress Notes (Signed)
Barbara Parks  [phone] [phone]) [phone]    ID: [name] DOB: 07/23/1951  MR#: [id number]  IHK#:[id number]  Patient Care Team: [name], MD as PCP - General (Family Medicine) Jerline Pain, MD (Cardiology) Gaye Pollack, MD (Cardiothoracic Surgery) Magrinat, Virgie Dad, MD as Consulting Physician (Oncology) OTHER MD: Dr Erroll Luna- Merlene Laughter, DDS; Gaynelle Arabian MD   CHIEF COMPLAINT: metastatic breast cancer, formerly on BOLERO study (s/p right mastectomy)  CURRENT TREATMENT: Fulvestrant, ribociclib, denosumab/Xgeva   INTERVAL HISTORY: Barbara Parks returns today for follow-up and treatment of her metastatic breast cancer.   She was switched to ribociclib at her last visit on 08/22/2020. She is currently on the "off" week of her second cycle, which she started on 70/24/2022.  She says she is a little bit tired but then she spent the day yesterday working in her yard and that is made her a little bit sore as well.  Overall she is doing "pretty good".  She has some reflux and "stomach upset" for which she takes Prilosec.  She has had no rash, no cough, no diarrhea or constipation, and no palpitations.    She continues on fulvestrant every 4 weeks and is tolerating this quite well.  She is also receiving xgeva every 4 weeks with no issues.  She has no dental concners at present.  She is up to date on mammography, most recently on 04/13/2020.    REVIEW OF SYSTEMS: A detailed review of systems today was negative except as noted above   COVID 19 VACCINATION STATUS: Refuses vaccination   BREAST CANCER HISTORY: From Dr. Collier [name]'s original intake note 04/13/2004:  "This woman has been in good health all of her life. She recently moved from [place] to work here.  She palpated a mass at about the 12 o'clock position in mid-July. She has not noticed any nipple retraction or skin changes.  She was seen by her primary care doctor who subsequently  referred her for a mammogram.  Mammogram was performed on 03/01/04. This demonstrated a spiculated 2 cm mass at the 12 o'clock position in the right breast.  Upper outer quadrant of the left breast shows some distortion.   Physical exam at that time showed a firm, nontender nodule at the 12 o'clock position in the right breast, 5 cm from the nipple.  Ultrasound of this area showed a hypoechoic ill-defined mass, measuring 2.2 x 1.3 x 1.4 cm.  Physical exam of the left breast showed general vague thickening, upper outer quadrant of the left breast with a discrete palpable mass. The ultrasound performed showed a single hypoechoic ill-defined nodule at the 1 o'clock position, measuring 7 x 5 x 6 mm.    She had biopsies of both lesions on 03/02/03.  Needle core biopsy of the lesion on the right breast revealed invasive mammary carcinoma.  Needle core biopsy of the left breast showed a complex fibroadenoma.  Prognostic panel of the lesion on the right breast showed it to be ER positive at 73%, PR positive at 90% and proliferative index 9%, HER-2 was 1+.  Patient was referred to Dr. Annamaria Boots, who performed a simple mastectomy with sentinel lymph node evaluation on 03/22/04.  Final pathology showed this to be an invasive ductal carcinoma  with lobular features, measuring 2.2 cm, grade 2 of 3.  Margins negative for carcinoma.  Invasive ductal carcinoma was extended to involve deep dermis of the nipple.  Lymphovascular invasion was identified.  Total of 4 sentinel lymph  nodes were evaluated.  Touch imprints at the time of the OR was felt to be negative.  Subsequent evaluation showed a 5 mm focus of metastatic carcinoma in one of the four lymph nodes on microscopic after sectioning.  There was extracapsular extension  of one lymph node as well. The remaining three lymph nodes were all negative."  The patient's subsequent history is detailed above.   PAST MEDICAL HISTORY: Past Medical History:  Diagnosis Date  . Arthritis     left hip  . Asthma   . breast ca 2005   breast/chemo R mastectomy  . Dermatitis   . Diabetes mellitus without complication ([place])   . GERD (gastroesophageal reflux disease)   . Hypercholesteremia   . Metastasis to lung ([place]) dx'd 08/2011  . Osteopenia due to cancer therapy 09/09/2013  . Palpitations   . Personal history of chemotherapy   . Shortness of breath     PAST SURGICAL HISTORY: Past Surgical History:  Procedure Laterality Date  . BREAST SURGERY  2005   right  . CATARACT EXTRACTION W/PHACO Left 01/18/2014   Procedure: CATARACT EXTRACTION PHACO AND INTRAOCULAR LENS PLACEMENT (IOC);  Surgeon: Elta Guadeloupe T. Gershon Crane, MD;  Location: [facility];  Service: Ophthalmology;  Laterality: Left;  CDE 15.79  . CATARACT EXTRACTION W/PHACO Right 02/08/2014   Procedure: CATARACT EXTRACTION PHACO AND INTRAOCULAR LENS PLACEMENT (IOC);  Surgeon: Elta Guadeloupe T. Gershon Crane, MD;  Location: [facility];  Service: Ophthalmology;  Laterality: Right;  CDE 4.41  . CHEST TUBE INSERTION  09/11/2011   Procedure: INSERTION PLEURAL DRAINAGE CATHETER;  Surgeon: Gaye Pollack, MD;  Location: [facility];  Service: Thoracic;  Laterality: Right;  . MASTECTOMY Right   . PERICARDIAL WINDOW  09/11/2011   Procedure: PERICARDIAL WINDOW;  Surgeon: Gaye Pollack, MD;  Location: [facility];  Service: Thoracic;  Laterality: N/A;  . REMOVAL OF PLEURAL DRAINAGE CATHETER  12/19/2011   Procedure: REMOVAL OF PLEURAL DRAINAGE CATHETER;  Surgeon: Gaye Pollack, MD;  Location: [facility];  Service: Thoracic;  Laterality: Right;  TO BE DONE IN MINOR ROOM, SHORT STAY  . [place]  . TOTAL HIP ARTHROPLASTY Left 06/07/2015   Procedure: LEFT TOTAL HIP ARTHROPLASTY ANTERIOR APPROACH;  Surgeon: Gaynelle Arabian, MD;  Location: [facility];  Service: Orthopedics;  Laterality: Left;  Marland Kitchen VIDEO BRONCHOSCOPY  09/11/2011   Procedure: VIDEO BRONCHOSCOPY;  Surgeon: Gaye Pollack, MD;  Location: [facility] OR;  Service: Thoracic;  Laterality: N/A;    FAMILY HISTORY Family History  Problem  Relation Age of Onset  . Cancer Mother        breast  . Heart disease Father   . Diabetes Other   . Anesthesia problems Neg Hx   The patient's mother was diagnosed with breast cancer at age 51, she died age 52 from congestive heart failure.The patient's father died from heart disease at age 31.  She has one sister alive & well.  Two brothers alive & well, one with diabetes. The patient's sister was also diagnosed with breast cancer, and was tested for the BRCA gene, and was negative. The patient herself has not been tested. There is no history of ovarian cancer in the family   GYNECOLOGIC HISTORY:  No LMP recorded. Patient is postmenopausal. Menarche age 56, the patient is GX P0. She stopped having periods with chemotherapy in 2005. 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 She was a Archivist. She is single, lives alone with her Shitzu-poodle Sammie.. Family is all in the [place] area.     ADVANCED DIRECTIVES: Not in place. At the 02/23/2014 visit the patient was given the appropriate documents to complete and notarize at her discretion. She tells me she is planning to name her sister, Billie Ruddy, as healthcare power of attorney. [name] can be reached at [phone]   HEALTH MAINTENANCE: Social History   Tobacco Use  . Smoking status: Former Smoker    Packs/day: 1.50    Years: 30.00    Pack years: 45.00    Types: Cigarettes    Quit date: 09/10/2007    Years since quitting: 13.1  . Smokeless tobacco: Never Used  Substance Use Topics  . Alcohol use: No  . Drug use: No    Colonoscopy:  PAP:  Bone density: 04/09/2018, T score -1.8  Lipid panel:  Allergies  Allergen Reactions  . Aspirin     REACTION: upset stomach  . Latex Other (See Comments)    Blistering and skin peels off  . Nsaids Nausea And Vomiting    Extreme nausea and vomiting    Current  Outpatient Medications  Medication Sig Dispense Refill  . cholecalciferol (VITAMIN D3) 25 MCG (1000 UT) tablet Take 2 tablets (2,000 Units total) by mouth daily. 180 tablet 4  . cyclobenzaprine (FLEXERIL) 10 MG tablet Take 1 tablet (10 mg total) by mouth 2 (two) times daily as needed for muscle spasms. 20 tablet 0  . gemfibrozil (LOPID) 600 MG tablet TAKE 1 TABLET (600 MG TOTAL) BY MOUTH 2 (TWO) TIMES DAILY BEFORE A MEAL. 180 tablet 1  . loperamide (IMODIUM) 2 MG capsule Take 2 mg by mouth 4 (four) times daily as needed for diarrhea or loose stools. Reported on 11/30/2015    . metFORMIN (GLUCOPHAGE-XR) 500 MG 24 hr tablet Take 1 tablet (500 mg total) by mouth 3 (three) times daily before meals.  3  . prochlorperazine (COMPAZINE) 10 MG tablet Take 1 tablet (10 mg total) by mouth every 8 (eight) hours as needed for nausea or vomiting. 30 tablet 0  . ribociclib succ (KISQALI 600MG DAILY DOSE) 200 MG Therapy Pack Take 3 tablets (600 mg total) by mouth daily. Take for 21 days on, 7 days off, repeat every 28 days. Start 08/23/2020 63 tablet 6  . traMADol (ULTRAM) 50 MG tablet Take 0.5-1 tablets (25-50 mg total) by mouth every 6 (six) hours as needed. 30 tablet 0   No current facility-administered medications for this visit.    OBJECTIVE: White woman who appears younger than stated age 43:   10/17/20 1214  BP: 114/67  Pulse: (!) 110  Resp: 18  Temp: (!) 97.4 F (36.3 C)  SpO2: 100%   Wt Readings from Last 3 Encounters:  10/17/20 155 lb 1.6 oz (70.4 kg)  09/19/20 156 lb 12.8 oz (71.1 kg)  08/22/20 156 lb 8 oz (71 kg)   Body mass index is 26.21 kg/m.    ECOG FS:1 - Symptomatic but completely ambulatory  Sclerae unicteric, EOMs intact Wearing a mask No cervical or supraclavicular adenopathy Lungs no rales or rhonchi Heart regular rate and rhythm Abd soft, nontender, positive bowel sounds MSK no focal spinal tenderness, no upper extremity lymphedema Neuro: nonfocal, well oriented,  appropriate affect Breasts: The right breast is status post mastectomy.  There is no evidence of chest wall recurrence.  The left breast is benign.  Both axillae are benign.  LAB  RESULTS:  CMP     Component Value Date/Time   NA 139 09/19/2020 1211   NA 139 07/08/2017 0942   K 4.2 09/19/2020 1211   K 3.8 07/08/2017 0942   CL 106 09/19/2020 1211   CL 109 (H) 12/31/2012 0940   CO2 23 09/19/2020 1211   CO2 18 (L) 07/08/2017 0942   GLUCOSE 120 (H) 09/19/2020 1211   GLUCOSE 156 (H) 07/08/2017 0942   GLUCOSE 127 (H) 12/31/2012 0940   BUN 23 09/19/2020 1211   BUN 17.7 07/08/2017 0942   CREATININE 1.36 (H) 09/19/2020 1211   CREATININE 1.18 (H) 12/25/2017 0959   CREATININE 1.0 07/08/2017 0942   CALCIUM 9.8 09/19/2020 1211   CALCIUM 10.4 07/08/2017 0942   PROT 7.8 09/19/2020 1211   PROT 7.7 07/08/2017 0942   ALBUMIN 3.6 09/19/2020 1211   ALBUMIN 3.4 (L) 07/08/2017 0942   AST 14 (L) 09/19/2020 1211   AST 50 (H) 12/25/2017 0959   AST 54 (H) 07/08/2017 0942   ALT 11 09/19/2020 1211   ALT 53 12/25/2017 0959   ALT 39 07/08/2017 0942   ALKPHOS 103 09/19/2020 1211   ALKPHOS 232 (H) 07/08/2017 0942   BILITOT 0.2 (L) 09/19/2020 1211   BILITOT 0.3 12/25/2017 0959   BILITOT 0.42 07/08/2017 0942   GFRNONAA 42 (L) 09/19/2020 1211   GFRNONAA 47 (L) 12/25/2017 0959   GFRAA 49 (L) 04/05/2020 1504   GFRAA 54 (L) 12/25/2017 0959    I No results found for: SPEP  Lab Results  Component Value Date   WBC 4.1 10/17/2020   NEUTROABS 2.4 10/17/2020   HGB 14.6 10/17/2020   HCT 43.9 10/17/2020   MCV 102.1 (H) 10/17/2020   PLT 219 10/17/2020      Chemistry      Component Value Date/Time   NA 139 09/19/2020 1211   NA 139 07/08/2017 0942   K 4.2 09/19/2020 1211   K 3.8 07/08/2017 0942   CL 106 09/19/2020 1211   CL 109 (H) 12/31/2012 0940   CO2 23 09/19/2020 1211   CO2 18 (L) 07/08/2017 0942   BUN 23 09/19/2020 1211   BUN 17.7 07/08/2017 0942   CREATININE 1.36 (H) 09/19/2020 1211    CREATININE 1.18 (H) 12/25/2017 0959   CREATININE 1.0 07/08/2017 0942      Component Value Date/Time   CALCIUM 9.8 09/19/2020 1211   CALCIUM 10.4 07/08/2017 0942   ALKPHOS 103 09/19/2020 1211   ALKPHOS 232 (H) 07/08/2017 0942   AST 14 (L) 09/19/2020 1211   AST 50 (H) 12/25/2017 0959   AST 54 (H) 07/08/2017 0942   ALT 11 09/19/2020 1211   ALT 53 12/25/2017 0959   ALT 39 07/08/2017 0942   BILITOT 0.2 (L) 09/19/2020 1211   BILITOT 0.3 12/25/2017 0959   BILITOT 0.42 07/08/2017 0942       Lab Results  Component Value Date   LABCA2 58 (H) 09/14/2012    No components found for: [id number]  No results for input(s): INR in the last 168 hours.  Urinalysis    Component Value Date/Time   COLORURINE YELLOW 11/25/2017 0901   APPEARANCEUR HAZY (A) 11/25/2017 0901   LABSPEC 1.017 11/25/2017 0901   LABSPEC 1.030 07/13/2015 0841   PHURINE 5.0 11/25/2017 0901   GLUCOSEU NEGATIVE 11/25/2017 0901   GLUCOSEU Negative 07/13/2015 0841   HGBUR MODERATE (A) 11/25/2017 0901   BILIRUBINUR NEGATIVE 11/25/2017 0901   BILIRUBINUR Negative 07/13/2015 0841   KETONESUR NEGATIVE 11/25/2017 0901  PROTEINUR 100 (A) 11/25/2017 0901   UROBILINOGEN 0.2 07/13/2015 0841   NITRITE NEGATIVE 11/25/2017 0901   LEUKOCYTESUR NEGATIVE 11/25/2017 0901   LEUKOCYTESUR Negative 07/13/2015 0841    STUDIES: EKG today shows normal sinus rhythm with a rate of 98, PR interval 138, QRS 76, and QTc 441.  ASSESSMENT: 70 y.o. [place], [place] woman with stage IV breast cancer, on BOLERO-4 trial  (1) status post right mastectomy and sentinel lymph node sampling 03/22/2004 for a right upper-outer quadrant pT2 pN1, stage IIB invasive ductal carcinoma with lobular features, grade 2, estrogen receptor and progesterone receptor positive, HER-2 negative, with an MIB-1 of 9% ([id number] and [id number])  (2) addition all right axillary lymph node sampling 05/07/2004 showed 2 benign lymph nodes (4 lymph nodes previously removed, so total was  one positive lymph node out of 6; [id number])  (3) the patient was evaluated by radiation oncology; no postmastectomy radiation recommended  (4) adjuvant chemotherapy with dose dense doxorubicin and cyclophosphamide x4 cycles (first cycle delayed one week) followed by dose dense paclitaxel x4 was completed 09/18/2004  (5) tamoxifen started March 2006, discontinued 2009  METASTATIC DISEASE: February 2013 (6) presenting with a large pericardial effusion, large right pleural effusion and possible right middle lobe bronchial obstruction February 2013, status post pericardial window placement, fiberoptic bronchoscopy and right Pleurx placement 09/11/2011, with biopsy of the bronchus intermedius and pericardium positive for metastatic breast cancer, estrogen receptor 91% positive with moderate staining intensity, progesterone receptor 100% positive with strong staining intensity, with an MIB-1 of 35%, and no HER-2 amplification, the signals ratio being 1.37 (SZA [id number])  (7) enrolled in BOLERO-4 trial 10/10/2011, receiving letrozole and everolimus  (a) two small areas of enhancement in the cerebellum noted by brain MRI 10/03/2011 were no longer apparent on repeat MRI 08/21/2012-- most recent brain MRI 04/01/2014 showed no evidence of intracranial metastatic disease  (b) sclerotic lesions in left iliac bone and sacrum have not been biopsied; stable; to start zolendronate after patient updates her dental care (extraction planned)--never started  (c) RLL lung nodule, stable (rescanned 07/12/2014 and 08/11/2014) R hilar and subcarinal lymph nodes: stable  (d) CT of the chest:03/25/2018, stable  (e) CT Angio 12/12/2018 stable  (f) off BOLERO trial (trial ended) as of July 2020, but continuing on study drugs  (g) letrozole and everolimus discontinued January 2021 with disease progression  (8) additional problems:  (a) hepatic steatosis  (b) COPD/ emphysema/ asthma  (c) advanced L hip osteoarthritis, status  post left total hip replacement 06/07/2015  (d) aortoiliac atherosclerosis  (e) dental evaluation pending w possible dental extractions  (f) likely thalassemia trait  (g) hyperlipidemia  (9) Bone density concerns: DEXA scan 08/11/2013 was normal  (a) repeat DEXA scan 04/09/2018 shows a T score of -1.8, osteopenia  (10) disease monitoring:  (a) PET scan 12/22/2018 shows no hypermetabolic soft tissue metastases but multifocal bony metastasis  (b) denosumab/Xgeva started on 02/25/2019, repeated every 28 days (delayed due to dental extraction healing issues)  (c) bone scan 05/13/2019 serves as baseline study with increased activity in the right scapular, lower ribs and right femoral head (stable compared to PET scan of May 2020  (d) bone scan 07/09/2019 stable  (e) CT chest 07/09/2019 shows stable lung lesions but emerging left liver lobe 0.7 cm lesion  (f) liver MRI 08/03/2019 shows approximately 8 liver lesions, the largest measuring 1.8 cm.  (g) liver biopsy 08/06/2019 confirms metastatic breast cancer, estrogen and progesterone receptor positive, with an MIB-1 of 5% and HER-2 not  amplified  (h) chest CT and bone scan 11/11/2019 essentially stable  (i) liver MRI 02/01/2020 stable to improved  (j) chest CT and bone scan 04/26/2020 stable  (11) denosumab/Xgeva begun 02/25/2019 repeated every 4 weeks  (12) fulvestrant started 08/24/2019, repeated every 4 weeks  (a) palbociclib 125 mg daily 21 days on 7 days off started 08/25/2019  (b) chest CT 07/24/2020 stable  (c) liver MRI 07/25/2020 shows mild progression in measurable disease  (d) palbociclib discontinued and ribociclib substituted starting 08/22/2020   (i) QTc on 08/22/2020 was 430   (ii) QTc on 10/17/2020 was 441  (13) foundation 1 requested on liver biopsy from 08/16/2019   PLAN: Joud is tolerating treatment well.  We have a CA 27-29 pending today.  There has been no obvious trend in this tumor marker so far but then she is only  just completed her second cycle on the current treatment.  She will start cycle #3 on 10/19/2020.  She will then see me on April 19.  Before that visit she will have a repeat MRI of the liver which is where we have the most definitively measurable disease.  I am also requesting a foundation 1 from the liver biopsy obtained last year.  We reviewed her EKG today which was favorable with no QTC elevation.  She knows to call for any other issue that may develop before the next visit  Total encounter time 25 minutes.Sarajane Jews C. Magrinat, MD 10/17/20 12:31 PM Medical Oncology and Hematology [facility] [place], [place] [address] Tel. [phone]    Fax. [phone]   I, Wilburn Mylar, am acting as scribe for Dr. Virgie Dad. Magrinat.  I, Lurline Del MD, have reviewed the above documentation for accuracy and completeness, and I agree with the above.   *Total Encounter Time as defined by the Centers for Medicare and Medicaid Services includes, in addition to the face-to-face time of a patient visit (documented in the note above) non-face-to-face time: obtaining and reviewing outside history, ordering and reviewing medications, tests or procedures, care coordination (communications with other health care professionals or caregivers) and documentation in the medical record.

## 2020-10-17 ENCOUNTER — Inpatient Hospital Stay (HOSPITAL_BASED_OUTPATIENT_CLINIC_OR_DEPARTMENT_OTHER): Payer: Medicare Other | Admitting: Oncology

## 2020-10-17 ENCOUNTER — Inpatient Hospital Stay: Payer: Medicare Other | Attending: Oncology

## 2020-10-17 ENCOUNTER — Inpatient Hospital Stay: Payer: Medicare Other

## 2020-10-17 ENCOUNTER — Other Ambulatory Visit: Payer: Self-pay

## 2020-10-17 VITALS — BP 114/67 | HR 110 | Temp 97.4°F | Resp 18 | Ht 64.5 in | Wt 155.1 lb

## 2020-10-17 DIAGNOSIS — C7951 Secondary malignant neoplasm of bone: Secondary | ICD-10-CM | POA: Diagnosis not present

## 2020-10-17 DIAGNOSIS — Z170 Estrogen receptor positive status [ER+]: Secondary | ICD-10-CM | POA: Diagnosis not present

## 2020-10-17 DIAGNOSIS — C7800 Secondary malignant neoplasm of unspecified lung: Secondary | ICD-10-CM | POA: Diagnosis not present

## 2020-10-17 DIAGNOSIS — C50411 Malignant neoplasm of upper-outer quadrant of right female breast: Secondary | ICD-10-CM

## 2020-10-17 DIAGNOSIS — I313 Pericardial effusion (noninflammatory): Secondary | ICD-10-CM | POA: Diagnosis not present

## 2020-10-17 DIAGNOSIS — Z5111 Encounter for antineoplastic chemotherapy: Secondary | ICD-10-CM | POA: Insufficient documentation

## 2020-10-17 DIAGNOSIS — Z79899 Other long term (current) drug therapy: Secondary | ICD-10-CM | POA: Diagnosis not present

## 2020-10-17 DIAGNOSIS — C801 Malignant (primary) neoplasm, unspecified: Secondary | ICD-10-CM

## 2020-10-17 DIAGNOSIS — I3131 Malignant pericardial effusion in diseases classified elsewhere: Secondary | ICD-10-CM

## 2020-10-17 DIAGNOSIS — C50911 Malignant neoplasm of unspecified site of right female breast: Secondary | ICD-10-CM

## 2020-10-17 DIAGNOSIS — C787 Secondary malignant neoplasm of liver and intrahepatic bile duct: Secondary | ICD-10-CM

## 2020-10-17 LAB — CBC WITH DIFFERENTIAL/PLATELET
Abs Immature Granulocytes: 0.04 10*3/uL (ref 0.00–0.07)
Basophils Absolute: 0.1 10*3/uL (ref 0.0–0.1)
Basophils Relative: 2 %
Eosinophils Absolute: 0 10*3/uL (ref 0.0–0.5)
Eosinophils Relative: 1 %
HCT: 43.9 % (ref 36.0–46.0)
Hemoglobin: 14.6 g/dL (ref 12.0–15.0)
Immature Granulocytes: 1 %
Lymphocytes Relative: 20 %
Lymphs Abs: 0.8 10*3/uL (ref 0.7–4.0)
MCH: 34 pg (ref 26.0–34.0)
MCHC: 33.3 g/dL (ref 30.0–36.0)
MCV: 102.1 fL — ABNORMAL HIGH (ref 80.0–100.0)
Monocytes Absolute: 0.8 10*3/uL (ref 0.1–1.0)
Monocytes Relative: 20 %
Neutro Abs: 2.4 10*3/uL (ref 1.7–7.7)
Neutrophils Relative %: 56 %
Platelets: 219 10*3/uL (ref 150–400)
RBC: 4.3 MIL/uL (ref 3.87–5.11)
RDW: 15 % (ref 11.5–15.5)
WBC: 4.1 10*3/uL (ref 4.0–10.5)
nRBC: 0 % (ref 0.0–0.2)

## 2020-10-17 LAB — COMPREHENSIVE METABOLIC PANEL
ALT: 12 U/L (ref 0–44)
AST: 15 U/L (ref 15–41)
Albumin: 3.8 g/dL (ref 3.5–5.0)
Alkaline Phosphatase: 127 U/L — ABNORMAL HIGH (ref 38–126)
Anion gap: 11 (ref 5–15)
BUN: 26 mg/dL — ABNORMAL HIGH (ref 8–23)
CO2: 20 mmol/L — ABNORMAL LOW (ref 22–32)
Calcium: 10 mg/dL (ref 8.9–10.3)
Chloride: 106 mmol/L (ref 98–111)
Creatinine, Ser: 1.49 mg/dL — ABNORMAL HIGH (ref 0.44–1.00)
GFR, Estimated: 38 mL/min — ABNORMAL LOW (ref >60)
Glucose, Bld: 131 mg/dL — ABNORMAL HIGH (ref 70–99)
Potassium: 4.3 mmol/L (ref 3.5–5.1)
Sodium: 137 mmol/L (ref 135–145)
Total Bilirubin: 0.3 mg/dL (ref 0.3–1.2)
Total Protein: 8.4 g/dL — ABNORMAL HIGH (ref 6.5–8.1)

## 2020-10-17 MED ORDER — DENOSUMAB 120 MG/1.7ML ~~LOC~~ SOLN
SUBCUTANEOUS | Status: AC
  Filled 2020-10-17: qty 1.7

## 2020-10-17 MED ORDER — FULVESTRANT 250 MG/5ML IM SOLN
500.0000 mg | Freq: Once | INTRAMUSCULAR | Status: AC
  Administered 2020-10-17: 500 mg via INTRAMUSCULAR

## 2020-10-17 MED ORDER — FULVESTRANT 250 MG/5ML IM SOLN
INTRAMUSCULAR | Status: AC
  Filled 2020-10-17: qty 10

## 2020-10-17 MED ORDER — DENOSUMAB 120 MG/1.7ML ~~LOC~~ SOLN
120.0000 mg | Freq: Once | SUBCUTANEOUS | Status: AC
  Administered 2020-10-17: 120 mg via SUBCUTANEOUS

## 2020-10-17 NOTE — Progress Notes (Signed)
Foundation One requested to [facility] on accession number [id number]

## 2020-10-17 NOTE — Patient Instructions (Signed)
Denosumab injection What is this medicine? DENOSUMAB (den oh sue mab) slows bone breakdown. Prolia is used to treat osteoporosis in women after menopause and in men, and in people who are taking corticosteroids for 6 months or more. Xgeva is used to treat a high calcium level due to cancer and to prevent bone fractures and other bone problems caused by multiple myeloma or cancer bone metastases. Xgeva is also used to treat giant cell tumor of the bone. This medicine may be used for other purposes; ask your health care provider or pharmacist if you have questions. COMMON BRAND NAME(S): Prolia, XGEVA What should I tell my health care provider before I take this medicine? They need to know if you have any of these conditions:  dental disease  having surgery or tooth extraction  infection  kidney disease  low levels of calcium or Vitamin D in the blood  malnutrition  on hemodialysis  skin conditions or sensitivity  thyroid or parathyroid disease  an unusual reaction to denosumab, other medicines, foods, dyes, or preservatives  pregnant or trying to get pregnant  breast-feeding How should I use this medicine? This medicine is for injection under the skin. It is given by a health care professional in a hospital or clinic setting. A special MedGuide will be given to you before each treatment. Be sure to read this information carefully each time. For Prolia, talk to your pediatrician regarding the use of this medicine in children. Special care may be needed. For Xgeva, talk to your pediatrician regarding the use of this medicine in children. While this drug may be prescribed for children as young as 13 years for selected conditions, precautions do apply. Overdosage: If you think you have taken too much of this medicine contact a poison control center or emergency room at once. NOTE: This medicine is only for you. Do not share this medicine with others. What if I miss a dose? It is  important not to miss your dose. Call your doctor or health care professional if you are unable to keep an appointment. What may interact with this medicine? Do not take this medicine with any of the following medications:  other medicines containing denosumab This medicine may also interact with the following medications:  medicines that lower your chance of fighting infection  steroid medicines like prednisone or cortisone This list may not describe all possible interactions. Give your health care provider a list of all the medicines, herbs, non-prescription drugs, or dietary supplements you use. Also tell them if you smoke, drink alcohol, or use illegal drugs. Some items may interact with your medicine. What should I watch for while using this medicine? Visit your doctor or health care professional for regular checks on your progress. Your doctor or health care professional may order blood tests and other tests to see how you are doing. Call your doctor or health care professional for advice if you get a fever, chills or sore throat, or other symptoms of a cold or flu. Do not treat yourself. This drug may decrease your body's ability to fight infection. Try to avoid being around people who are sick. You should make sure you get enough calcium and vitamin D while you are taking this medicine, unless your doctor tells you not to. Discuss the foods you eat and the vitamins you take with your health care professional. See your dentist regularly. Brush and floss your teeth as directed. Before you have any dental work done, tell your dentist you are   receiving this medicine. Do not become pregnant while taking this medicine or for 5 months after stopping it. Talk with your doctor or health care professional about your birth control options while taking this medicine. Women should inform their doctor if they wish to become pregnant or think they might be pregnant. There is a potential for serious side  effects to an unborn child. Talk to your health care professional or pharmacist for more information. What side effects may I notice from receiving this medicine? Side effects that you should report to your doctor or health care professional as soon as possible:  allergic reactions like skin rash, itching or hives, swelling of the face, lips, or tongue  bone pain  breathing problems  dizziness  jaw pain, especially after dental work  redness, blistering, peeling of the skin  signs and symptoms of infection like fever or chills; cough; sore throat; pain or trouble passing urine  signs of low calcium like fast heartbeat, muscle cramps or muscle pain; pain, tingling, numbness in the hands or feet; seizures  unusual bleeding or bruising  unusually weak or tired Side effects that usually do not require medical attention (report to your doctor or health care professional if they continue or are bothersome):  constipation  diarrhea  headache  joint pain  loss of appetite  muscle pain  runny nose  tiredness  upset stomach This list may not describe all possible side effects. Call your doctor for medical advice about side effects. You may report side effects to FDA at 1-800-FDA-1088. Where should I keep my medicine? This medicine is only given in a clinic, doctor's office, or other health care setting and will not be stored at home. NOTE: This sheet is a summary. It may not cover all possible information. If you have questions about this medicine, talk to your doctor, pharmacist, or health care provider.  2021 [employer] Standard (2017-11-21 16:10:44)   Fulvestrant injection What is this medicine? FULVESTRANT (ful VES trant) blocks the effects of estrogen. It is used to treat breast cancer. This medicine may be used for other purposes; ask your health care provider or pharmacist if you have questions. COMMON BRAND NAME(S): FASLODEX What should I tell my health care  provider before I take this medicine? They need to know if you have any of these conditions:  bleeding disorders  liver disease  low blood counts, like low white cell, platelet, or red cell counts  an unusual or allergic reaction to fulvestrant, other medicines, foods, dyes, or preservatives  pregnant or trying to get pregnant  breast-feeding How should I use this medicine? This medicine is for injection into a muscle. It is usually given by a health care professional in a hospital or clinic setting. Talk to your pediatrician regarding the use of this medicine in children. Special care may be needed. Overdosage: If you think you have taken too much of this medicine contact a poison control center or emergency room at once. NOTE: This medicine is only for you. Do not share this medicine with others. What if I miss a dose? It is important not to miss your dose. Call your doctor or health care professional if you are unable to keep an appointment. What may interact with this medicine?  medicines that treat or prevent blood clots like warfarin, enoxaparin, dalteparin, apixaban, dabigatran, and rivaroxaban This list may not describe all possible interactions. Give your health care provider a list of all the medicines, herbs, non-prescription drugs, or dietary supplements  Also tell them if you smoke, drink alcohol, or use illegal drugs. Some items may interact with your medicine. What should I watch for while using this medicine? Your condition will be monitored carefully while you are receiving this medicine. You will need important blood work done while you are taking this medicine. Do not become pregnant while taking this medicine or for at least 1 year after stopping it. Women of child-bearing potential will need to have a negative pregnancy test before starting this medicine. Women should inform their doctor if they wish to become pregnant or think they might be pregnant. There is  a potential for serious side effects to an unborn child. Men should inform their doctors if they wish to father a child. This medicine may lower sperm counts. Talk to your health care professional or pharmacist for more information. Do not breast-feed an infant while taking this medicine or for 1 year after the last dose. What side effects may I notice from receiving this medicine? Side effects that you should report to your doctor or health care professional as soon as possible:  allergic reactions like skin rash, itching or hives, swelling of the face, lips, or tongue  feeling faint or lightheaded, falls  pain, tingling, numbness, or weakness in the legs  signs and symptoms of infection like fever or chills; cough; flu-like symptoms; sore throat  vaginal bleeding Side effects that usually do not require medical attention (report to your doctor or health care professional if they continue or are bothersome):  aches, pains  constipation  diarrhea  headache  hot flashes  nausea, vomiting  pain at site where injected  stomach pain This list may not describe all possible side effects. Call your doctor for medical advice about side effects. You may report side effects to FDA at 1-800-FDA-1088. Where should I keep my medicine? This drug is given in a hospital or clinic and will not be stored at home. NOTE: This sheet is a summary. It may not cover all possible information. If you have questions about this medicine, talk to your doctor, pharmacist, or health care provider.  2021 [employer]/Gold Standard (2017-10-23 11:34:41)  

## 2020-10-18 ENCOUNTER — Telehealth: Payer: Self-pay | Admitting: Oncology

## 2020-10-18 LAB — CANCER ANTIGEN 27.29: CA 27.29: 339.2 U/mL — ABNORMAL HIGH (ref 0.0–38.6)

## 2020-10-18 NOTE — Telephone Encounter (Signed)
Scheduled appts per 3/22 los. Called pt, no answer. Left msg with appts date and times.

## 2020-10-25 IMAGING — MG MM DIGITAL SCREENING UNILAT*L* W/ TOMO W/ CAD
6 series · 6 of 18 positions shown · non-contrast
Comparison: Previous exam(s).

CLINICAL DATA: Screening.

EXAM:
DIGITAL SCREENING UNILATERAL LEFT MAMMOGRAM WITH CAD AND TOMO

[L MLO synth-2D]
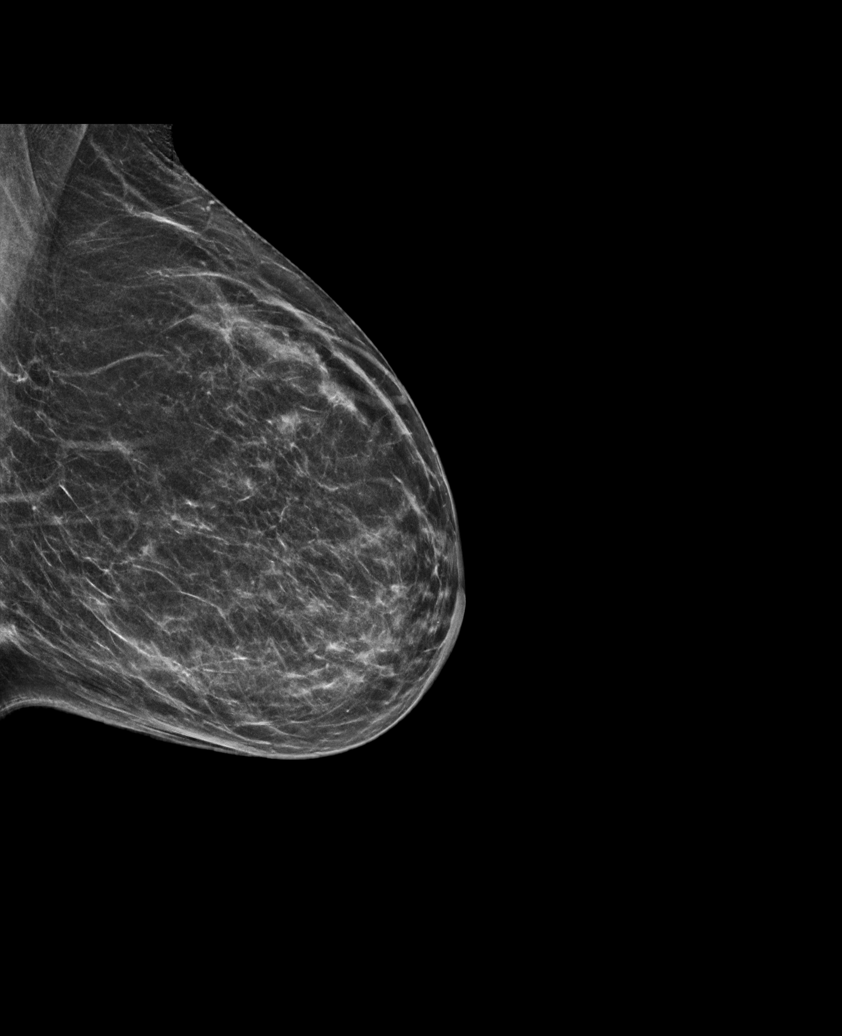

[L CC synth-2D (1 of 2)]
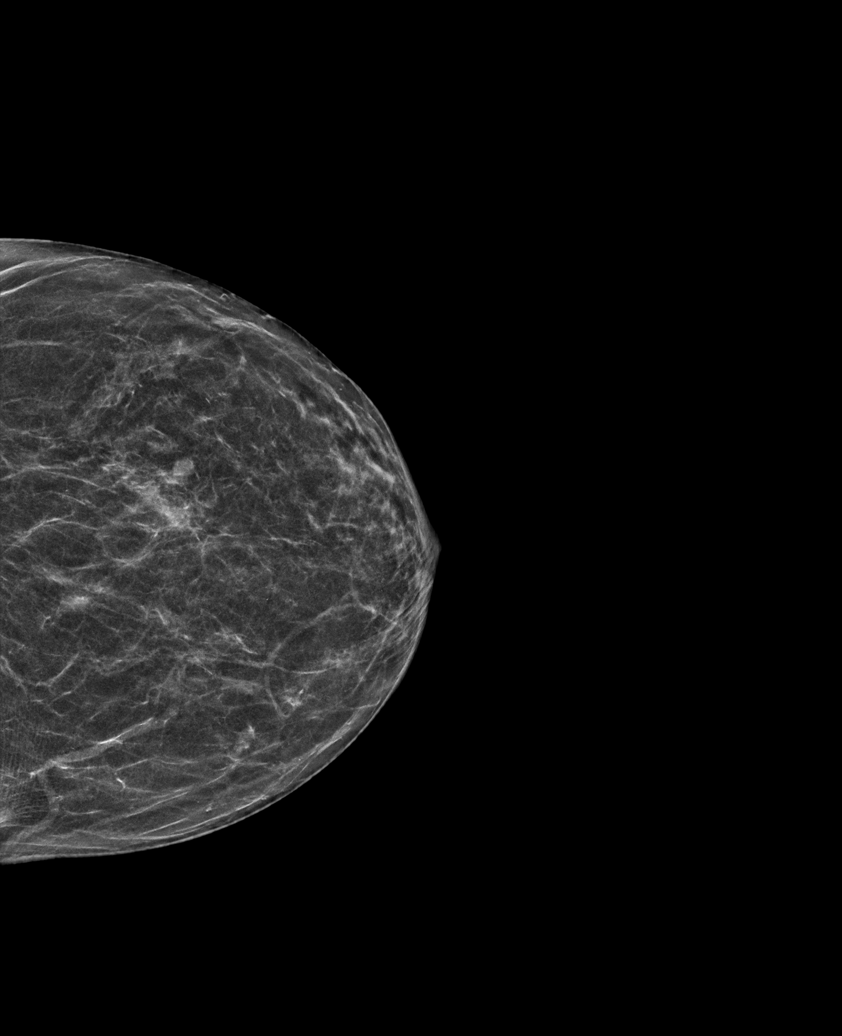

[L CC synth-2D (2 of 2)]
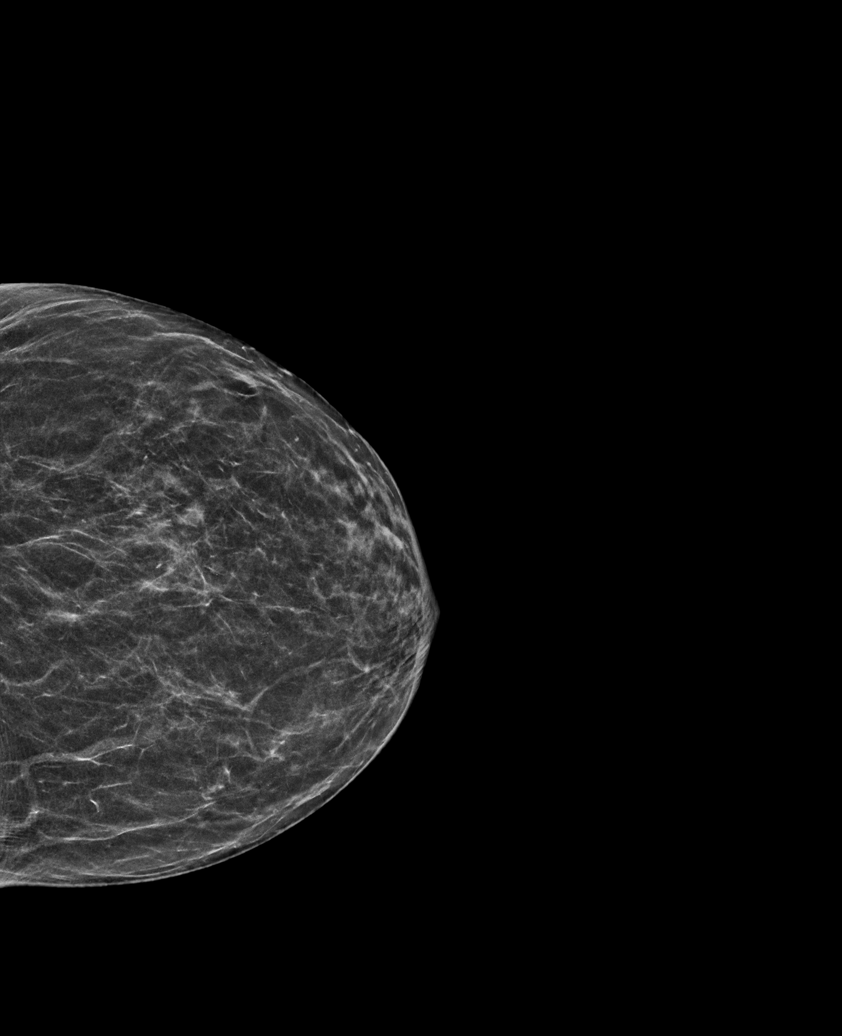

[L MLO tomo · tomo slice 30/59.0]
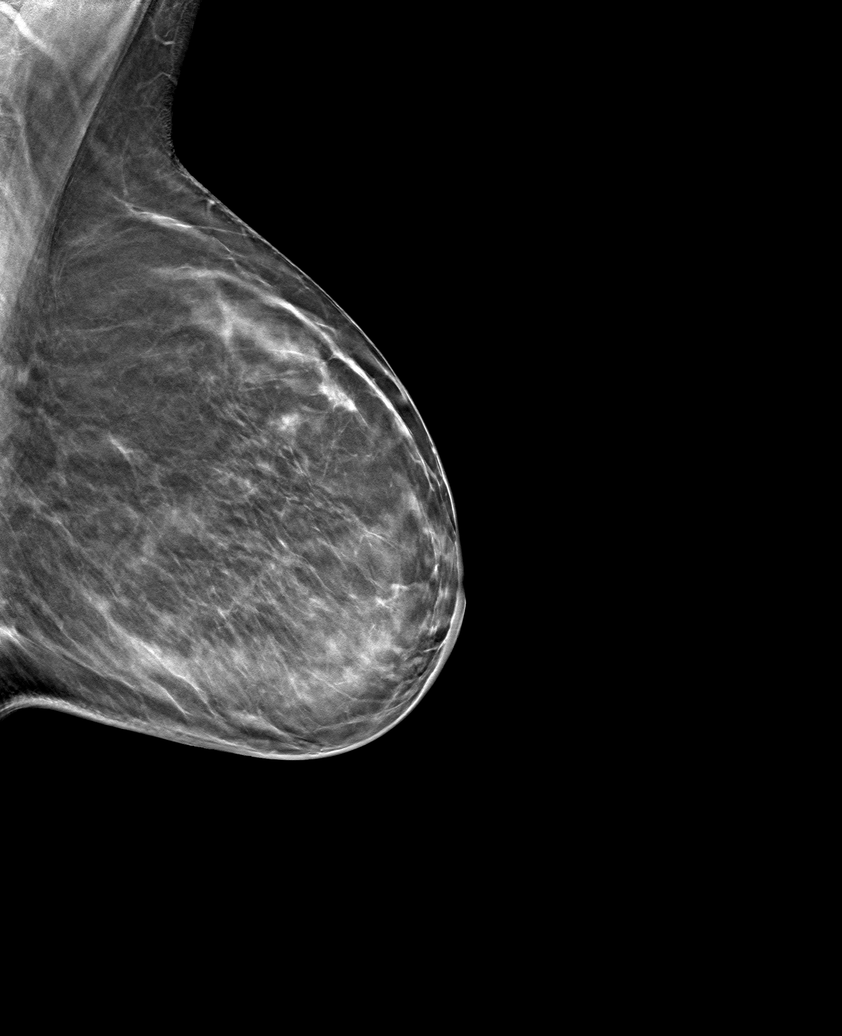

[L CC tomo (1 of 2) · tomo slice 27/52.0]
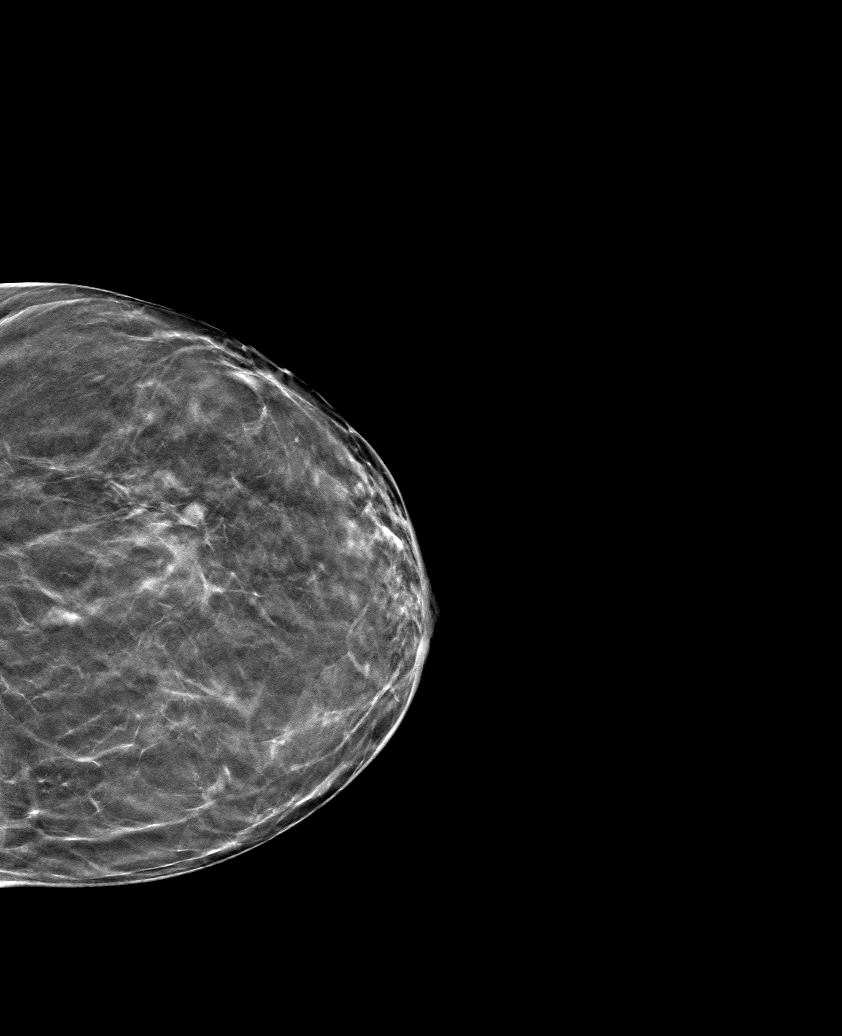

[L CC tomo (2 of 2) · tomo slice 27/52.0]
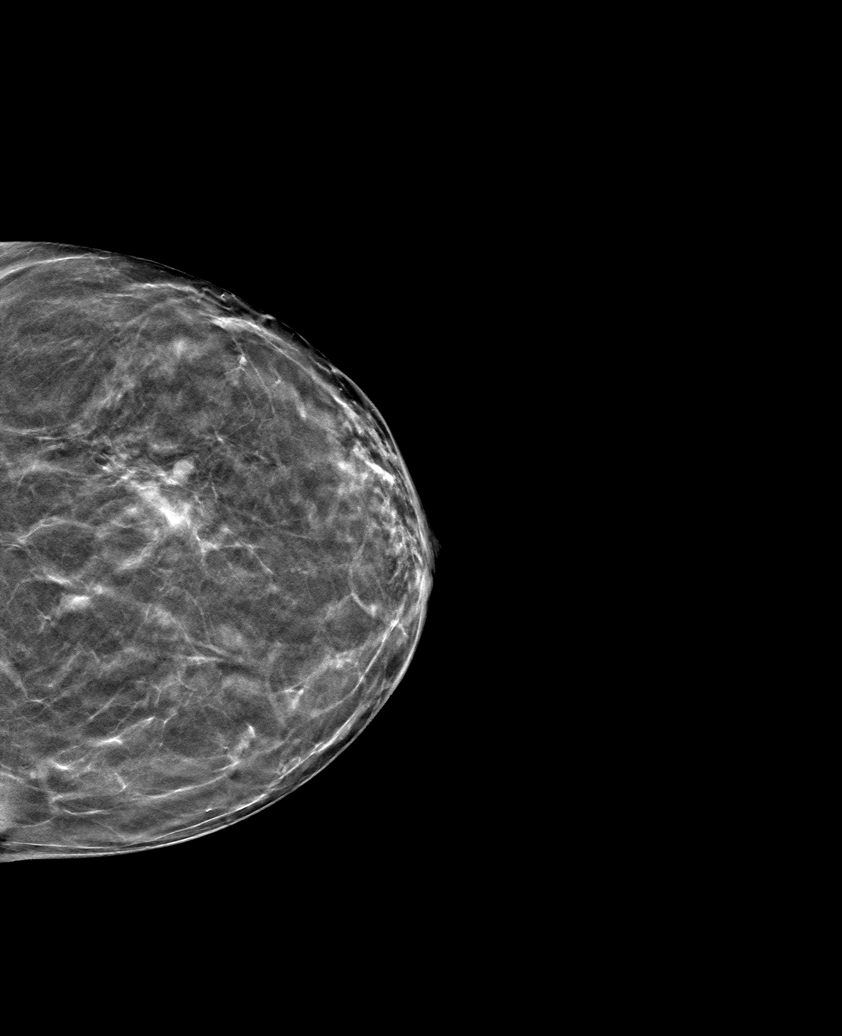

[6 of 18 positions shown; findings below may reference images not displayed]

ACR Breast Density Category c: The breast tissue is heterogeneously
dense, which may obscure small masses.
FINDINGS: There are no findings suspicious for malignancy. Images were
processed with CAD.
IMPRESSION: No mammographic evidence of malignancy. A result letter of this
screening mammogram will be mailed directly to the patient.

RECOMMENDATION:
Screening mammogram in one year. (Code:YR-[id number])

BI-RADS CATEGORY  1: Negative.

## 2020-10-30 DIAGNOSIS — C50411 Malignant neoplasm of upper-outer quadrant of right female breast: Secondary | ICD-10-CM | POA: Diagnosis not present

## 2020-10-30 DIAGNOSIS — C801 Malignant (primary) neoplasm, unspecified: Secondary | ICD-10-CM | POA: Diagnosis not present

## 2020-10-30 DIAGNOSIS — C787 Secondary malignant neoplasm of liver and intrahepatic bile duct: Secondary | ICD-10-CM | POA: Diagnosis not present

## 2020-10-31 ENCOUNTER — Encounter (HOSPITAL_COMMUNITY): Payer: Self-pay | Admitting: Oncology

## 2020-11-07 ENCOUNTER — Encounter (HOSPITAL_COMMUNITY): Payer: Self-pay

## 2020-11-11 ENCOUNTER — Ambulatory Visit (HOSPITAL_COMMUNITY)
Admission: RE | Admit: 2020-11-11 | Discharge: 2020-11-11 | Disposition: A | Discharge status: Home / Self Care | Payer: Medicare Other | Source: Ambulatory Visit | Attending: Oncology | Admitting: Oncology

## 2020-11-11 DIAGNOSIS — I313 Pericardial effusion (noninflammatory): Secondary | ICD-10-CM | POA: Diagnosis not present

## 2020-11-11 DIAGNOSIS — C7800 Secondary malignant neoplasm of unspecified lung: Secondary | ICD-10-CM | POA: Insufficient documentation

## 2020-11-11 DIAGNOSIS — Z8505 Personal history of malignant neoplasm of liver: Secondary | ICD-10-CM | POA: Diagnosis not present

## 2020-11-11 DIAGNOSIS — C50911 Malignant neoplasm of unspecified site of right female breast: Secondary | ICD-10-CM | POA: Diagnosis not present

## 2020-11-11 DIAGNOSIS — C801 Malignant (primary) neoplasm, unspecified: Secondary | ICD-10-CM | POA: Diagnosis not present

## 2020-11-11 DIAGNOSIS — D3500 Benign neoplasm of unspecified adrenal gland: Secondary | ICD-10-CM | POA: Diagnosis not present

## 2020-11-11 DIAGNOSIS — Z170 Estrogen receptor positive status [ER+]: Secondary | ICD-10-CM | POA: Diagnosis not present

## 2020-11-11 DIAGNOSIS — C7951 Secondary malignant neoplasm of bone: Secondary | ICD-10-CM | POA: Diagnosis not present

## 2020-11-11 DIAGNOSIS — K838 Other specified diseases of biliary tract: Secondary | ICD-10-CM | POA: Diagnosis not present

## 2020-11-11 DIAGNOSIS — I3131 Malignant pericardial effusion in diseases classified elsewhere: Secondary | ICD-10-CM

## 2020-11-11 DIAGNOSIS — C50411 Malignant neoplasm of upper-outer quadrant of right female breast: Secondary | ICD-10-CM | POA: Insufficient documentation

## 2020-11-11 MED ORDER — GADOBUTROL 1 MMOL/ML IV SOLN
7.0000 mL | Freq: Once | INTRAVENOUS | Status: AC | PRN
  Administered 2020-11-11: 7 mL via INTRAVENOUS

## 2020-11-13 NOTE — Progress Notes (Signed)
[place]  [phone] Fax:(336) [phone]    ID: Dandria Griego Swartzendruber DOB: 04-16-1951  MR#: [id number]  KAJ#:[id number]  Patient Care Team: [name], MD as PCP - General (Family Medicine) Jerline Pain, MD (Cardiology) Gaye Pollack, MD (Cardiothoracic Surgery) Khristopher Kapaun, Virgie Dad, MD as Consulting Physician (Oncology) OTHER MD: Dr Erroll Luna- Merlene Laughter, DDS; Gaynelle Arabian MD   CHIEF COMPLAINT: metastatic breast cancer, formerly on BOLERO study (s/p right mastectomy)  CURRENT TREATMENT: Fulvestrant, ribociclib, denosumab/Xgeva   INTERVAL HISTORY: Barbara Parks returns today for follow-up and treatment of her metastatic breast cancer.   Since her last visit, she underwent liver MRI on 70/16/2022 showing: no significant change in bilobar hepatic metastases; no definite new or enlarging hepatic lesions visualized; stable multifocal osseous metastases; stable small right pleural effusion with associated right lower lobe opacity.  She was switched to ribociclib at her last visit on 08/22/2020. She says she is a little bit tired but then she spent the day yesterday working in her yard and that is made her a little bit sore as well.  Overall she is doing "pretty good".  She has some reflux and "stomach upset" for which she takes Prilosec.  She has had no rash, no cough, no diarrhea or constipation, and no palpitations.    She continues on fulvestrant every 4 weeks and is tolerating this quite well.  She is also receiving xgeva every 4 weeks with no issues.  She has no dental concners at present.  She is up to date on mammography, most recently on 9/70/2021.    REVIEW OF SYSTEMS: Adeliz continues to tolerate treatment well.  She has had no significant diarrhea from the ribociclib, no rash, and no palpitations.  She is anchoring to pedicle off, which the weather has kept her away from.  A detailed review of systems today was stable.  COVID 19 VACCINATION STATUS: Refuses  vaccination   BREAST CANCER HISTORY: From Dr. Collier [name]'s original intake note 04/13/2004:  "This woman has been in good health all of her life. She recently moved from [place] to work here.  She palpated a mass at about the 12 o'clock position in mid-July. She has not noticed any nipple retraction or skin changes.  She was seen by her primary care doctor who subsequently referred her for a mammogram.  Mammogram was performed on 03/01/04. This demonstrated a spiculated 2 cm mass at the 12 o'clock position in the right breast.  Upper outer quadrant of the left breast shows some distortion.   Physical exam at that time showed a firm, nontender nodule at the 12 o'clock position in the right breast, 5 cm from the nipple.  Ultrasound of this area showed a hypoechoic ill-defined mass, measuring 2.2 x 1.3 x 1.4 cm.  Physical exam of the left breast showed general vague thickening, upper outer quadrant of the left breast with a discrete palpable mass. The ultrasound performed showed a single hypoechoic ill-defined nodule at the 1 o'clock position, measuring 7 x 5 x 6 mm.    She had biopsies of both lesions on 03/02/03.  Needle core biopsy of the lesion on the right breast revealed invasive mammary carcinoma.  Needle core biopsy of the left breast showed a complex fibroadenoma.  Prognostic panel of the lesion on the right breast showed it to be ER positive at 73%, PR positive at 90% and proliferative index 9%, HER-2 was 1+.  Patient was referred to Dr. Annamaria Boots, who performed a simple mastectomy with  sentinel lymph node evaluation on 03/22/04.  Final pathology showed this to be an invasive ductal carcinoma  with lobular features, measuring 2.2 cm, grade 2 of 3.  Margins negative for carcinoma.  Invasive ductal carcinoma was extended to involve deep dermis of the nipple.  Lymphovascular invasion was identified.  Total of 4 sentinel lymph nodes were evaluated.  Touch imprints at the time of the OR was felt to be  negative.  Subsequent evaluation showed a 5 mm focus of metastatic carcinoma in one of the four lymph nodes on microscopic after sectioning.  There was extracapsular extension  of one lymph node as well. The remaining three lymph nodes were all negative."  The patient's subsequent history is detailed above.   PAST MEDICAL HISTORY: Past Medical History:  Diagnosis Date  . Arthritis    left hip  . Asthma   . breast ca 2005   breast/chemo R mastectomy  . Dermatitis   . Diabetes mellitus without complication ([facility])   . GERD (gastroesophageal reflux disease)   . Hypercholesteremia   . Metastasis to lung ([place]) dx'd 08/2011  . Osteopenia due to cancer therapy 09/09/2013  . Palpitations   . Personal history of chemotherapy   . Shortness of breath     PAST SURGICAL HISTORY: Past Surgical History:  Procedure Laterality Date  . BREAST SURGERY  2005   right  . CATARACT EXTRACTION W/PHACO Left 01/18/2014   Procedure: CATARACT EXTRACTION PHACO AND INTRAOCULAR LENS PLACEMENT (IOC);  Surgeon: Elta Guadeloupe T. Gershon Crane, MD;  Location: [facility];  Service: Ophthalmology;  Laterality: Left;  CDE 15.79  . CATARACT EXTRACTION W/PHACO Right 02/08/2014   Procedure: CATARACT EXTRACTION PHACO AND INTRAOCULAR LENS PLACEMENT (IOC);  Surgeon: Elta Guadeloupe T. Gershon Crane, MD;  Location: [facility];  Service: Ophthalmology;  Laterality: Right;  CDE 4.41  . CHEST TUBE INSERTION  09/11/2011   Procedure: INSERTION PLEURAL DRAINAGE CATHETER;  Surgeon: Gaye Pollack, MD;  Location: [facility];  Service: Thoracic;  Laterality: Right;  . MASTECTOMY Right   . PERICARDIAL WINDOW  09/11/2011   Procedure: PERICARDIAL WINDOW;  Surgeon: Gaye Pollack, MD;  [facility];  Service: Thoracic;  Laterality: N/A;  . REMOVAL OF PLEURAL DRAINAGE CATHETER  12/19/2011   Procedure: REMOVAL OF PLEURAL DRAINAGE CATHETER;  Surgeon: Gaye Pollack, MD;  Location: [facility];  Service: Thoracic;  Laterality: Right;  TO BE DONE IN MINOR ROOM, SHORT STAY  . [place]   . TOTAL HIP ARTHROPLASTY Left 06/07/2015   Procedure: LEFT TOTAL HIP ARTHROPLASTY ANTERIOR APPROACH;  Surgeon: Gaynelle Arabian, MD;  Location: [facility];  Service: Orthopedics;  Laterality: Left;  Marland Kitchen VIDEO BRONCHOSCOPY  09/11/2011   Procedure: VIDEO BRONCHOSCOPY;  Surgeon: Gaye Pollack, MD;  Location: [facility] OR;  Service: Thoracic;  Laterality: N/A;    FAMILY HISTORY Family History  Problem Relation Age of Onset  . Cancer Mother        breast  . Heart disease Father   . Diabetes Other   . Anesthesia problems Neg Hx   The patient's mother was diagnosed with breast cancer at age 75, she died age 23 from congestive heart failure.The patient's father died from heart disease at age 85.  She has one sister alive & well.  Two brothers alive & well, one with diabetes. The patient's sister was also diagnosed with breast cancer, and was tested for the BRCA gene, and was negative. The patient herself has not been tested. There is no history of ovarian  cancer in the family   GYNECOLOGIC HISTORY:  No LMP recorded. Patient is postmenopausal. Menarche age 39, the patient is GX P0. She stopped having periods with chemotherapy in 2005. 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 She was a Archivist. She is single, lives alone with her Shitzu-poodle Sammie.. Family is all in the [place] area.     ADVANCED DIRECTIVES: Not in place. At the 02/23/2014 visit the patient was given the appropriate documents to complete and notarize at her discretion. She tells me she is planning to name her sister, Billie Ruddy, as healthcare power of attorney. [name] can be reached at [phone]   HEALTH MAINTENANCE: Social History   Tobacco Use  . Smoking status: Former Smoker    Packs/day: 1.50    Years: 30.00    Pack years: 45.00    Types: Cigarettes    Quit date: 09/10/2007    Years since quitting: 13.1   . Smokeless tobacco: Never Used  Substance Use Topics  . Alcohol use: No  . Drug use: No    Colonoscopy:  PAP:  Bone density: 04/09/2018, T score -1.8  Lipid panel:  Allergies  Allergen Reactions  . Aspirin     REACTION: upset stomach  . Latex Other (See Comments)    Blistering and skin peels off  . Nsaids Nausea And Vomiting    Extreme nausea and vomiting    Current Outpatient Medications  Medication Sig Dispense Refill  . cholecalciferol (VITAMIN D3) 25 MCG (1000 UT) tablet Take 2 tablets (2,000 Units total) by mouth daily. 180 tablet 4  . cyclobenzaprine (FLEXERIL) 10 MG tablet Take 1 tablet (10 mg total) by mouth 2 (two) times daily as needed for muscle spasms. 20 tablet 0  . gemfibrozil (LOPID) 600 MG tablet TAKE 1 TABLET (600 MG TOTAL) BY MOUTH 2 (TWO) TIMES DAILY BEFORE A MEAL. 180 tablet 1  . loperamide (IMODIUM) 2 MG capsule Take 2 mg by mouth 4 (four) times daily as needed for diarrhea or loose stools. Reported on 11/30/2015    . metFORMIN (GLUCOPHAGE-XR) 500 MG 24 hr tablet Take 1 tablet (500 mg total) by mouth 3 (three) times daily before meals.  3  . prochlorperazine (COMPAZINE) 10 MG tablet Take 1 tablet (10 mg total) by mouth every 8 (eight) hours as needed for nausea or vomiting. 30 tablet 0  . ribociclib succ (KISQALI 600MG DAILY DOSE) 200 MG Therapy Pack TAKE 3 TABLETS (600 MG TOTAL) BY MOUTH DAILY. TAKE FOR 21 DAYS ON, 7 DAYS OFF, REPEAT EVERY 28 DAYS. START 08/23/2020 63 each 0  . traMADol (ULTRAM) 50 MG tablet Take 0.5-1 tablets (25-50 mg total) by mouth every 6 (six) hours as needed. 30 tablet 0   No current facility-administered medications for this visit.    OBJECTIVE: White woman who appears younger than stated age 24:   11/14/20 1212  BP: 112/62  Pulse: (!) 110  Resp: 20  Temp: 97.9 F (36.6 C)  SpO2: 97%   Wt Readings from Last 3 Encounters:  11/14/20 153 lb (69.4 kg)  10/17/20 155 lb 1.6 oz (70.4 kg)  09/19/20 156 lb 12.8 oz (71.1 kg)    Body mass index is 25.86 kg/m.    ECOG FS:1 - Symptomatic but completely ambulatory  Sclerae unicteric, EOMs intact Wearing a mask No cervical or supraclavicular adenopathy Lungs no rales or rhonchi  Heart regular rate and rhythm Abd soft, nontender, positive bowel sounds MSK no focal spinal tenderness, no upper extremity lymphedema Neuro: nonfocal, well oriented, appropriate affect Breasts: The right breast has undergone mastectomy.  There is no evidence of local recurrence.  The left breast is benign.  Both axillae are benign.   LAB  RESULTS:  CMP     Component Value Date/Time   NA 139 11/14/2020 1142   NA 139 07/08/2017 0942   K 4.4 11/14/2020 1142   K 3.8 07/08/2017 0942   CL 107 11/14/2020 1142   CL 109 (H) 12/31/2012 0940   CO2 21 (L) 11/14/2020 1142   CO2 18 (L) 07/08/2017 0942   GLUCOSE 123 (H) 11/14/2020 1142   GLUCOSE 156 (H) 07/08/2017 0942   GLUCOSE 127 (H) 12/31/2012 0940   BUN 29 (H) 11/14/2020 1142   BUN 17.7 07/08/2017 0942   CREATININE 1.50 (H) 11/14/2020 1142   CREATININE 1.18 (H) 12/25/2017 0959   CREATININE 1.0 07/08/2017 0942   CALCIUM 10.0 11/14/2020 1142   CALCIUM 10.4 07/08/2017 0942   PROT 8.6 (H) 11/14/2020 1142   PROT 7.7 07/08/2017 0942   ALBUMIN 3.9 11/14/2020 1142   ALBUMIN 3.4 (L) 07/08/2017 0942   AST 16 11/14/2020 1142   AST 50 (H) 12/25/2017 0959   AST 54 (H) 07/08/2017 0942   ALT 8 11/14/2020 1142   ALT 53 12/25/2017 0959   ALT 39 07/08/2017 0942   ALKPHOS 148 (H) 11/14/2020 1142   ALKPHOS 232 (H) 07/08/2017 0942   BILITOT 0.3 11/14/2020 1142   BILITOT 0.3 12/25/2017 0959   BILITOT 0.42 07/08/2017 0942   GFRNONAA 37 (L) 11/14/2020 1142   GFRNONAA 47 (L) 12/25/2017 0959   GFRAA 49 (L) 04/05/2020 1504   GFRAA 54 (L) 12/25/2017 0959    I No results found for: SPEP  Lab Results  Component Value Date   WBC 5.3 11/14/2020   NEUTROABS 3.8 11/14/2020   HGB 14.8 11/14/2020   HCT 44.6 11/14/2020   MCV 102.8 (H)  11/14/2020   PLT 189 11/14/2020      Chemistry      Component Value Date/Time   NA 139 11/14/2020 1142   NA 139 07/08/2017 0942   K 4.4 11/14/2020 1142   K 3.8 07/08/2017 0942   CL 107 11/14/2020 1142   CL 109 (H) 12/31/2012 0940   CO2 21 (L) 11/14/2020 1142   CO2 18 (L) 07/08/2017 0942   BUN 29 (H) 11/14/2020 1142   BUN 17.7 07/08/2017 0942   CREATININE 1.50 (H) 11/14/2020 1142   CREATININE 1.18 (H) 12/25/2017 0959   CREATININE 1.0 07/08/2017 0942      Component Value Date/Time   CALCIUM 10.0 11/14/2020 1142   CALCIUM 10.4 07/08/2017 0942   ALKPHOS 148 (H) 11/14/2020 1142   ALKPHOS 232 (H) 07/08/2017 0942   AST 16 11/14/2020 1142   AST 50 (H) 12/25/2017 0959   AST 54 (H) 07/08/2017 0942   ALT 8 11/14/2020 1142   ALT 53 12/25/2017 0959   ALT 39 07/08/2017 0942   BILITOT 0.3 11/14/2020 1142   BILITOT 0.3 12/25/2017 0959   BILITOT 0.42 07/08/2017 0942       Lab Results  Component Value Date   LABCA2 58 (H) 09/14/2012    No components found for: [id number]  No results for input(s): INR in the last 168 hours.  Urinalysis    Component Value Date/Time   COLORURINE YELLOW 11/25/2017 0901   APPEARANCEUR HAZY (A) 11/25/2017  0901   LABSPEC 1.017 11/25/2017 0901   LABSPEC 1.030 07/13/2015 0841   PHURINE 5.0 11/25/2017 0901   GLUCOSEU NEGATIVE 11/25/2017 0901   GLUCOSEU Negative 07/13/2015 0841   HGBUR MODERATE (A) 11/25/2017 0901   BILIRUBINUR NEGATIVE 11/25/2017 0901   BILIRUBINUR Negative 07/13/2015 0841   KETONESUR NEGATIVE 11/25/2017 0901   PROTEINUR 100 (A) 11/25/2017 0901   UROBILINOGEN 0.2 07/13/2015 0841   NITRITE NEGATIVE 11/25/2017 0901   LEUKOCYTESUR NEGATIVE 11/25/2017 0901   LEUKOCYTESUR Negative 07/13/2015 0841    STUDIES: MR LIVER W WO CONTRAST  Result Date: 11/13/2020 CLINICAL DATA:  Metastatic breast cancer, reassess hepatic lesions. EXAM: MRI ABDOMEN WITHOUT AND WITH CONTRAST TECHNIQUE: Multiplanar multisequence MR imaging of the abdomen  was performed both before and after the administration of intravenous contrast. CONTRAST:  2m GADAVIST GADOBUTROL 1 MMOL/ML IV SOLN COMPARISON:  Multiple priors including MRI abdomen July 25, 2020. FINDINGS: Lower chest: Similar appearance of the small right pleural effusion with associated right lower lobe opacity. Hepatobiliary: Similar size of the bilobar small hepatic metastases (approximately 7-8), best visualized on diffusion and pre contrast T1 imaging. No definite new or enlarging hepatic lesions visualized. Index lesions are as follows: -segment II lesion measures 12 mm on image 30/12, unchanged. -segment V lesion measures 11 mm on image 36/12, unchanged. -segment VI lesion measures it 9 mm on image 42/12, unchanged. Adenomyomatosis in the fundus of the gallbladder. Stable mild dilation of the common bile duct in the setting of periampullary duodenal diverticulum. Pancreas:  Within normal limits. Spleen:  Within normal limits. Adrenals/Urinary Tract: Unchanged size of the benign 13 mm left adrenal adenoma. Right adrenal glands thin normal limits. No hydronephrosis. No suspicious enhancing renal lesions. Stomach/Bowel: Small periampullary duodenal diverticulum. Moderate sized duodenal diverticulum at the third-fourth portion of the duodenum. No acute gastrointestinal process within the visualized bowel loops to the extent evaluated on MRI. Vascular/Lymphatic: No pathologically enlarged lymph nodes identified. No abdominal aortic aneurysm demonstrated. Other:  No abdominal ascites. Musculoskeletal: Stable multifocal osseous metastases including T10, T11 and L3 better evaluated on prior CT and bone scan. IMPRESSION: 1. No significant change in bilobar hepatic metastases. No definite new or enlarging hepatic lesions visualized. 2. Stable multifocal osseous metastases better evaluated on prior CT and bone scan. 3. Small right pleural effusion with associated right lower lobe opacity, unchanged.  Electronically Signed   By: JDahlia BailiffMD   On: 11/13/2020 08:45    ASSESSMENT: 70y.o. [place], [place] woman with stage IV breast cancer, on BOLERO-4 trial  (1) status post right mastectomy and sentinel lymph node sampling 03/22/2004 for a right upper-outer quadrant pT2 pN1, stage IIB invasive ductal carcinoma with lobular features, grade 2, estrogen receptor and progesterone receptor positive, HER-2 negative, with an MIB-1 of 9% (([id number] [id number]  (2) addition all right axillary lymph node sampling 05/07/2004 showed 2 benign lymph nodes (4 lymph nodes previously removed, so total was one positive lymph node out of 6; [id number])  (3) the patient was evaluated by radiation oncology; no postmastectomy radiation recommended  (4) adjuvant chemotherapy with dose dense doxorubicin and cyclophosphamide x4 cycles (first cycle delayed one week) followed by dose dense paclitaxel x4 was completed 09/18/2004  (5) tamoxifen started March 2006, discontinued 2009  METASTATIC DISEASE: February 2013 (6) presenting with a large pericardial effusion, large right pleural effusion and possible right middle lobe bronchial obstruction February 2013, status post pericardial window placement, fiberoptic bronchoscopy and right Pleurx placement 09/11/2011, with biopsy of the bronchus intermedius and  pericardium positive for metastatic breast cancer, estrogen receptor 91% positive with moderate staining intensity, progesterone receptor 100% positive with strong staining intensity, with an MIB-1 of 35%, and no HER-2 amplification, the signals ratio being 1.37 (SZA [id number])  (7) enrolled in BOLERO-4 trial 10/10/2011, receiving letrozole and everolimus  (a) two small areas of enhancement in the cerebellum noted by brain MRI 10/03/2011 were no longer apparent on repeat MRI 08/21/2012-- most recent brain MRI 04/01/2014 showed no evidence of intracranial metastatic disease  (b) sclerotic lesions in left iliac bone and sacrum  have not been biopsied; stable; to start zolendronate after patient updates her dental care (extraction planned)--never started  (c) RLL lung nodule, stable (rescanned 07/12/2014 and 08/11/2014) R hilar and subcarinal lymph nodes: stable  (d) CT of the chest:03/25/2018, stable  (e) CT Angio 12/12/2018 stable  (f) off BOLERO trial (trial ended) as of July 2020, but continuing on study drugs  (g) letrozole and everolimus discontinued January 2021 with disease progression  (8) additional problems:  (a) hepatic steatosis  (b) COPD/ emphysema/ asthma  (c) advanced L hip osteoarthritis, status post left total hip replacement 06/07/2015  (d) aortoiliac atherosclerosis  (e) dental evaluation pending w possible dental extractions  (f) likely thalassemia trait  (g) hyperlipidemia  (9) Bone density concerns: DEXA scan 08/11/2013 was normal  (a) repeat DEXA scan 04/09/2018 shows a T score of -1.8, osteopenia  (10) disease monitoring:  (a) PET scan 12/22/2018 shows no hypermetabolic soft tissue metastases but multifocal bony metastasis  (b) denosumab/Xgeva started on 02/25/2019, repeated every 28 days (delayed due to dental extraction healing issues)  (c) bone scan 05/13/2019 serves as baseline study with increased activity in the right scapular, lower ribs and right femoral head (stable compared to PET scan of May 2020  (d) bone scan 07/09/2019 stable  (e) CT chest 07/09/2019 shows stable lung lesions but emerging left liver lobe 0.7 cm lesion  (f) liver MRI 08/03/2019 shows approximately 8 liver lesions, the largest measuring 1.8 cm.  (g) liver biopsy 08/06/2019 confirms metastatic breast cancer, estrogen and progesterone receptor positive, with an MIB-1 of 5% and HER-2 not amplified  (h) chest CT and bone scan 11/11/2019 essentially stable  (i) liver MRI 02/01/2020 stable to improved  (j) chest CT and bone scan 04/26/2020 stable  (11) denosumab/Xgeva begun 02/25/2019 repeated every 4 weeks  (12)  fulvestrant started 08/24/2019, repeated every 4 weeks  (a) palbociclib 125 mg daily 21 days on 7 days off started 08/25/2019  (b) chest CT 07/24/2020 stable  (c) liver MRI 07/25/2020 shows mild progression in measurable disease  (d) palbociclib discontinued and ribociclib substituted starting 08/22/2020   (i) QTc on 08/22/2020 was 430   (ii) QTc on 10/17/2020 was 441  (e) liver MRI 11/13/2020 shows stable disease  (13) foundation 1 requested on liver biopsy from 08/16/2019 shows a stable microsatellite status with 1 mutation/MB, amplification of C11orf30, FGF10 and RICTOR, with mutations in ESR 1 and PTEN   PLAN: Veronika is tolerating the abemaciclib well.  The CA 27-29 appears to be dropping.  The liver MRI shows no evidence of progression.  Currently we are continuing on this medication.  She qualifies for genetics testing.  We discussed this at length and she agrees.  This will be done 4 weeks from now and 8 weeks from now she will discuss that with our genetics counselors as appropriate.  She will return to see me in 12 weeks.  By then she will have had her repeat CT  of the chest and we will consider whether additional staging studies are needed  Total encounter time today 25 minutes.Sarajane Jews C. Aiman Noe, MD 11/14/20 6:09 PM Medical Oncology and Hematology [facility] [place], [place] [address] Tel. [phone]    Fax. [phone]   I, Wilburn Mylar, am acting as scribe for Dr. Virgie Dad. Taylor Spilde.  I, Lurline Del MD, have reviewed the above documentation for accuracy and completeness, and I agree with the above.   *Total Encounter Time as defined by the Centers for Medicare and Medicaid Services includes, in addition to the face-to-face time of a patient visit (documented in the note above) non-face-to-face time: obtaining and reviewing outside history, ordering and reviewing medications, tests or procedures, care coordination  (communications with other health care professionals or caregivers) and documentation in the medical record.

## 2020-11-14 ENCOUNTER — Inpatient Hospital Stay: Payer: Medicare Other | Attending: Oncology

## 2020-11-14 ENCOUNTER — Other Ambulatory Visit: Payer: Self-pay

## 2020-11-14 ENCOUNTER — Inpatient Hospital Stay (HOSPITAL_BASED_OUTPATIENT_CLINIC_OR_DEPARTMENT_OTHER): Payer: Medicare Other | Admitting: Oncology

## 2020-11-14 ENCOUNTER — Inpatient Hospital Stay: Payer: Medicare Other

## 2020-11-14 VITALS — BP 112/62 | HR 110 | Temp 97.9°F | Resp 20 | Ht 64.5 in | Wt 153.0 lb

## 2020-11-14 DIAGNOSIS — Z5111 Encounter for antineoplastic chemotherapy: Secondary | ICD-10-CM | POA: Diagnosis not present

## 2020-11-14 DIAGNOSIS — C50411 Malignant neoplasm of upper-outer quadrant of right female breast: Secondary | ICD-10-CM

## 2020-11-14 DIAGNOSIS — C7800 Secondary malignant neoplasm of unspecified lung: Secondary | ICD-10-CM

## 2020-11-14 DIAGNOSIS — Z170 Estrogen receptor positive status [ER+]: Secondary | ICD-10-CM

## 2020-11-14 DIAGNOSIS — Z79899 Other long term (current) drug therapy: Secondary | ICD-10-CM | POA: Insufficient documentation

## 2020-11-14 DIAGNOSIS — C7951 Secondary malignant neoplasm of bone: Secondary | ICD-10-CM

## 2020-11-14 DIAGNOSIS — C50911 Malignant neoplasm of unspecified site of right female breast: Secondary | ICD-10-CM

## 2020-11-14 DIAGNOSIS — I3131 Malignant pericardial effusion in diseases classified elsewhere: Secondary | ICD-10-CM

## 2020-11-14 DIAGNOSIS — I313 Pericardial effusion (noninflammatory): Secondary | ICD-10-CM

## 2020-11-14 DIAGNOSIS — C787 Secondary malignant neoplasm of liver and intrahepatic bile duct: Secondary | ICD-10-CM

## 2020-11-14 LAB — COMPREHENSIVE METABOLIC PANEL
ALT: 8 U/L (ref 0–44)
AST: 16 U/L (ref 15–41)
Albumin: 3.9 g/dL (ref 3.5–5.0)
Alkaline Phosphatase: 148 U/L — ABNORMAL HIGH (ref 38–126)
Anion gap: 11 (ref 5–15)
BUN: 29 mg/dL — ABNORMAL HIGH (ref 8–23)
CO2: 21 mmol/L — ABNORMAL LOW (ref 22–32)
Calcium: 10 mg/dL (ref 8.9–10.3)
Chloride: 107 mmol/L (ref 98–111)
Creatinine, Ser: 1.5 mg/dL — ABNORMAL HIGH (ref 0.44–1.00)
GFR, Estimated: 37 mL/min — ABNORMAL LOW (ref >60)
Glucose, Bld: 123 mg/dL — ABNORMAL HIGH (ref 70–99)
Potassium: 4.4 mmol/L (ref 3.5–5.1)
Sodium: 139 mmol/L (ref 135–145)
Total Bilirubin: 0.3 mg/dL (ref 0.3–1.2)
Total Protein: 8.6 g/dL — ABNORMAL HIGH (ref 6.5–8.1)

## 2020-11-14 LAB — CBC WITH DIFFERENTIAL/PLATELET
Abs Immature Granulocytes: 0.02 10*3/uL (ref 0.00–0.07)
Basophils Absolute: 0.1 10*3/uL (ref 0.0–0.1)
Basophils Relative: 2 %
Eosinophils Absolute: 0 10*3/uL (ref 0.0–0.5)
Eosinophils Relative: 1 %
HCT: 44.6 % (ref 36.0–46.0)
Hemoglobin: 14.8 g/dL (ref 12.0–15.0)
Immature Granulocytes: 0 %
Lymphocytes Relative: 11 %
Lymphs Abs: 0.6 10*3/uL — ABNORMAL LOW (ref 0.7–4.0)
MCH: 34.1 pg — ABNORMAL HIGH (ref 26.0–34.0)
MCHC: 33.2 g/dL (ref 30.0–36.0)
MCV: 102.8 fL — ABNORMAL HIGH (ref 80.0–100.0)
Monocytes Absolute: 0.7 10*3/uL (ref 0.1–1.0)
Monocytes Relative: 14 %
Neutro Abs: 3.8 10*3/uL (ref 1.7–7.7)
Neutrophils Relative %: 72 %
Platelets: 189 10*3/uL (ref 150–400)
RBC: 4.34 MIL/uL (ref 3.87–5.11)
RDW: 15.2 % (ref 11.5–15.5)
WBC: 5.3 10*3/uL (ref 4.0–10.5)
nRBC: 0 % (ref 0.0–0.2)

## 2020-11-14 MED ORDER — DENOSUMAB 120 MG/1.7ML ~~LOC~~ SOLN
SUBCUTANEOUS | Status: AC
  Filled 2020-11-14: qty 1.7

## 2020-11-14 MED ORDER — FULVESTRANT 250 MG/5ML IM SOLN
500.0000 mg | Freq: Once | INTRAMUSCULAR | Status: AC
  Administered 2020-11-14: 500 mg via INTRAMUSCULAR

## 2020-11-14 MED ORDER — FULVESTRANT 250 MG/5ML IM SOLN
INTRAMUSCULAR | Status: AC
  Filled 2020-11-14: qty 10

## 2020-11-14 MED ORDER — DENOSUMAB 120 MG/1.7ML ~~LOC~~ SOLN
120.0000 mg | Freq: Once | SUBCUTANEOUS | Status: AC
  Administered 2020-11-14: 120 mg via SUBCUTANEOUS

## 2020-11-15 LAB — CANCER ANTIGEN 27.29: CA 27.29: 365.2 U/mL — ABNORMAL HIGH (ref 0.0–38.6)

## 2020-11-16 ENCOUNTER — Telehealth: Payer: Self-pay | Admitting: Oncology

## 2020-11-16 NOTE — Telephone Encounter (Signed)
Scheduled per 4/19 los. Called and spoke with pt confirmed 5/17 appts

## 2020-11-24 IMAGING — CT CT CHEST W/ CM
2 series · 15 of 35 positions shown, 18 images · IV contrast (OMNIPAQUE)
Comparison: CTA of the chest of 12/12/2018. Standard diagnostic
chest CT of 11/19/2018 is directly reviewed.

CLINICAL DATA: Stage IV invasive breast cancer. Diagnosed in 6889.
Bone metastasis 1713. Right mastectomy 6889. Prior chemotherapy and
lymph node dissection. Oral chemotherapy in progress.

EXAM:
CT CHEST WITH CONTRAST
TECHNIQUE: Multidetector CT imaging of the chest was performed during
intravenous contrast administration.
CONTRAST:  75mL OMNIPAQUE IOHEXOL 300 MG/ML SOLN 75 cc of Omnipaque
300

[Series 3: axial st · axial · 0.73mm/px · z∈[-165,+113]mm · 12 of 165 slices shown, 15 images]
[im 13/165  mediastinal]
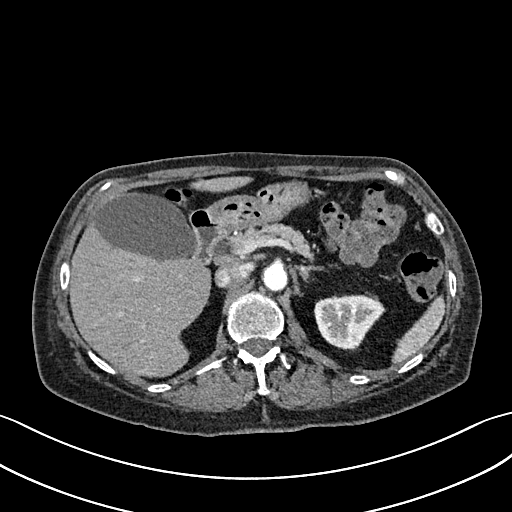
[im 13/165  lung]
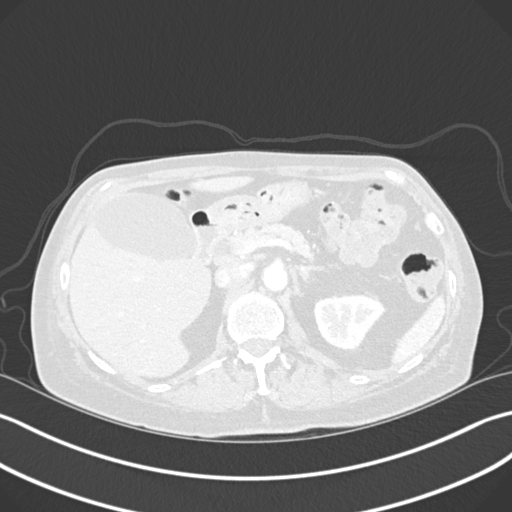
[im 25/165  lung]
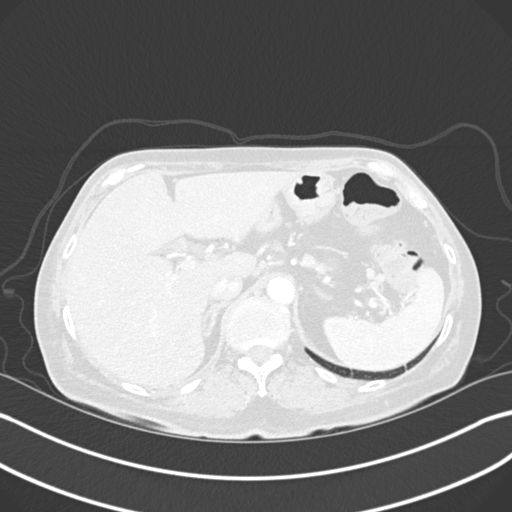
[im 37/165  lung]
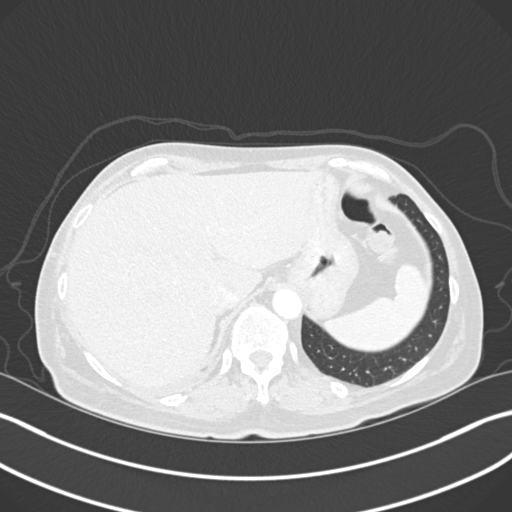
[im 49/165  lung]
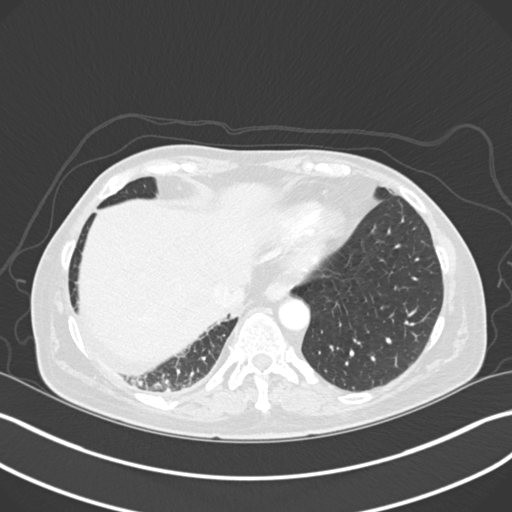
[im 67/165  mediastinal]
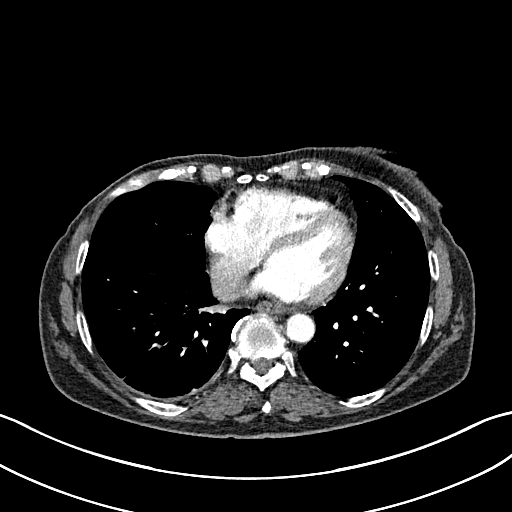
[im 67/165  lung]
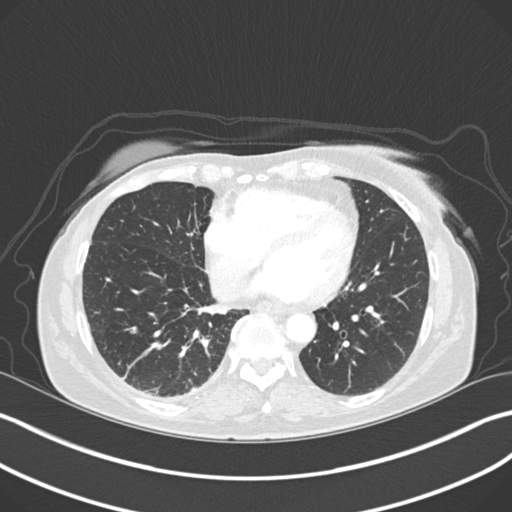
[im 79/165  lung]
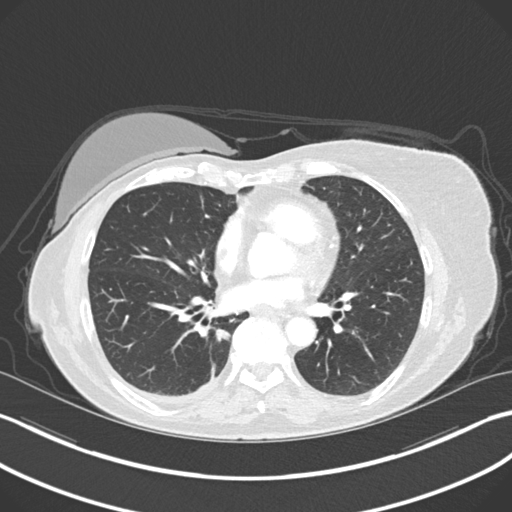
[im 86/165  lung]
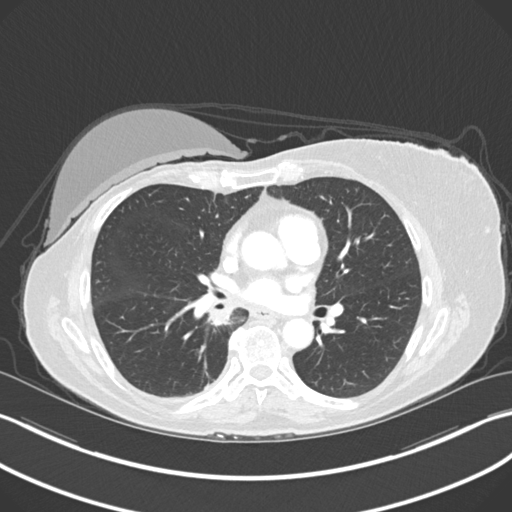
[im 98/165  lung]
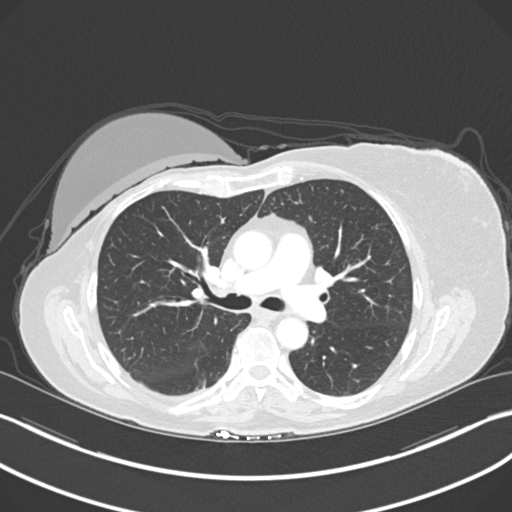
[im 116/165  mediastinal]
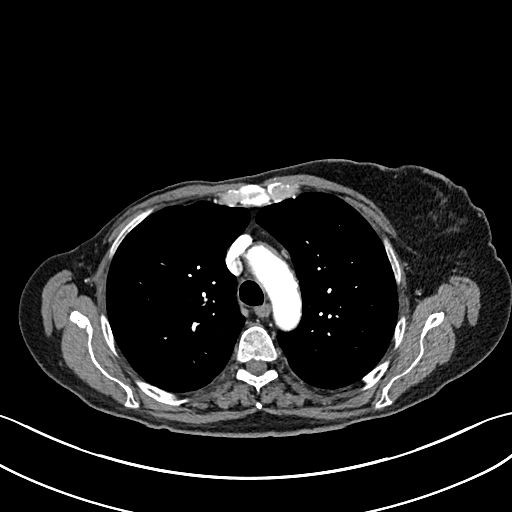
[im 116/165  lung]
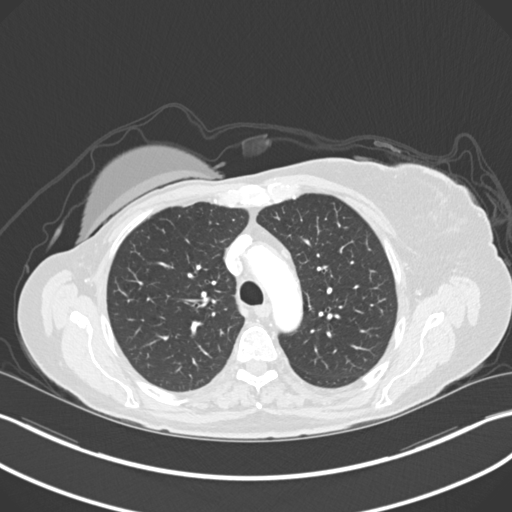
[im 128/165  lung]
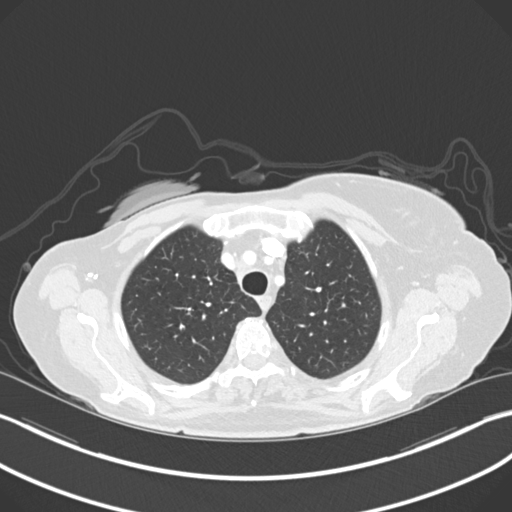
[im 140/165  lung]
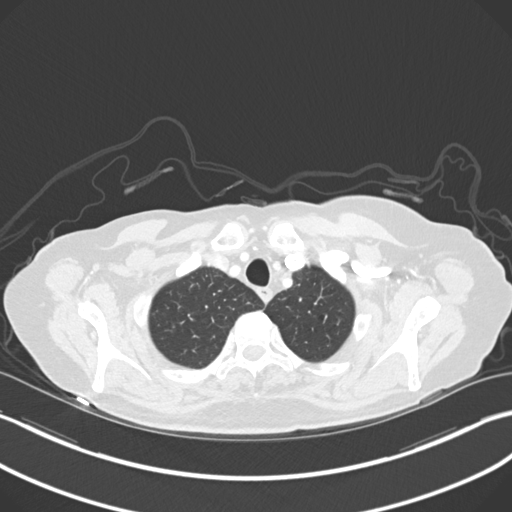
[im 152/165  lung]
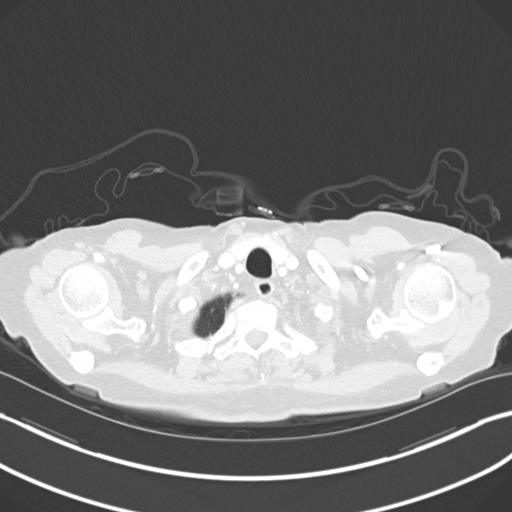

[Series 7: coronal · coronal · 0.69mm/px · 3 of 100 slices shown]
[im 20/100  lung]
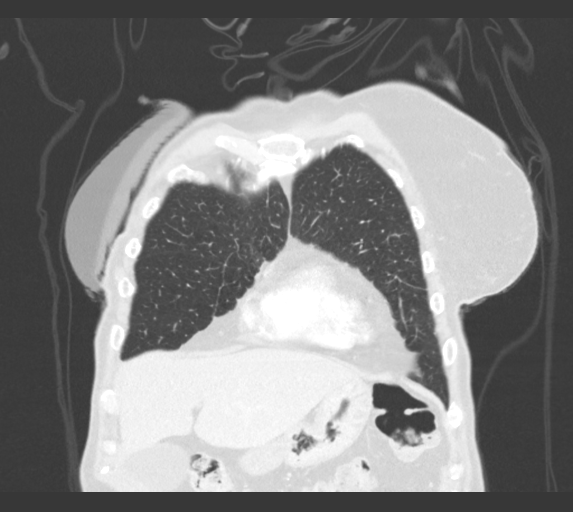
[im 40/100  lung]
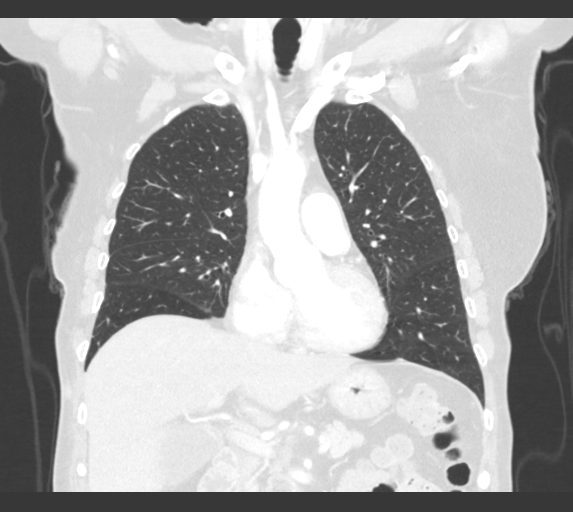
[im 60/100  lung]
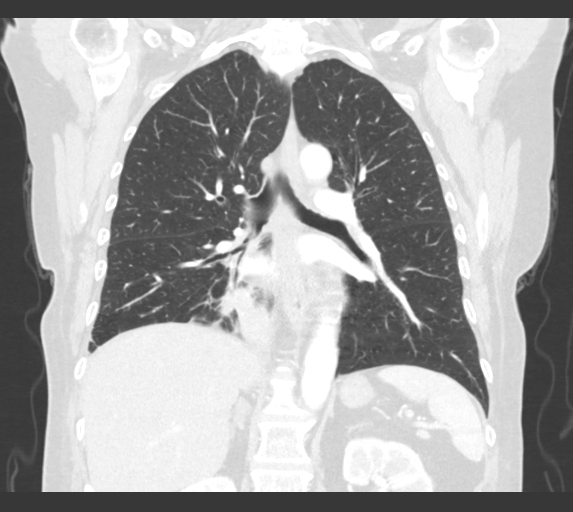

[15 of 35 positions shown; findings below may reference images not displayed]

FINDINGS: Cardiovascular: Advanced aortic and branch vessel atherosclerosis.
Tortuous thoracic aorta. Normal heart size, without pericardial
effusion. No central pulmonary embolism, on this non-dedicated
study.

Mediastinum/Nodes: No supraclavicular adenopathy. Right axillary
node dissection. No axillary adenopathy. No mediastinal or hilar
adenopathy.

Lungs/Pleura: Minimal right pleural thickening is unchanged. Trace
right pleural fluid is felt to be slightly increased.

Centrilobular emphysema.

Right lower lobe pulmonary nodule measures 1.6 x 1.1 cm on [DATE]
[DATE], similar.

The posterior right upper lobe nodule detailed on the 11/19/2018
exam has resolved.

The central right lower lobe nodule or less likely lymph node
posterior to the right lower lobe bronchus measures 1.1 cm on 80/6
and is similar.

Subpleural pulmonary nodules within the right lower lobe including
at maximally 4 mm are felt to be unchanged.

Upper Abdomen: Normal imaged portions of the liver, spleen, stomach,
pancreas, right adrenal gland, kidneys. Unchanged left adrenal
nodularity including at 1.2 cm.

Soft tissue density within the gallbladder fundus is similar at
cm, favored to represent an area of adenomyomatosis.

Musculoskeletal: Right mastectomy. Sclerosis and expansion of the
posteromedial left eleventh rib is similar on 117/3. Right ninth rib
heterogeneity and mild expansion are somewhat more conspicuous
today. Lucency within the scapular body a on 55/3 is new or more
conspicuous today.
IMPRESSION: 1. Compared to the 11/19/2018 exam, primarily similar pulmonary
nodules. The right upper lobe nodule which was new on that study has
resolved.
2. No thoracic adenopathy.
3. Osseous metastasis, felt to be slightly progressive compared to
the prior.
4.  Aortic Atherosclerosis ([id number]).  Emphysema ([id number]).
5. Chronic right pleural thickening with trace right pleural fluid,
felt to be slightly increased.
6. Left adrenal adenoma.

## 2020-11-28 IMAGING — DX DG HIP (WITH OR WITHOUT PELVIS) 2-3V*L*
3 series · 3 of 3 positions shown · non-contrast
Comparison: None.

CLINICAL DATA: Status post left hip replacement. Evaluate for
loosening.

EXAM:
DG HIP (WITH OR WITHOUT PELVIS) 2-3V LEFT

[pelvis ap]
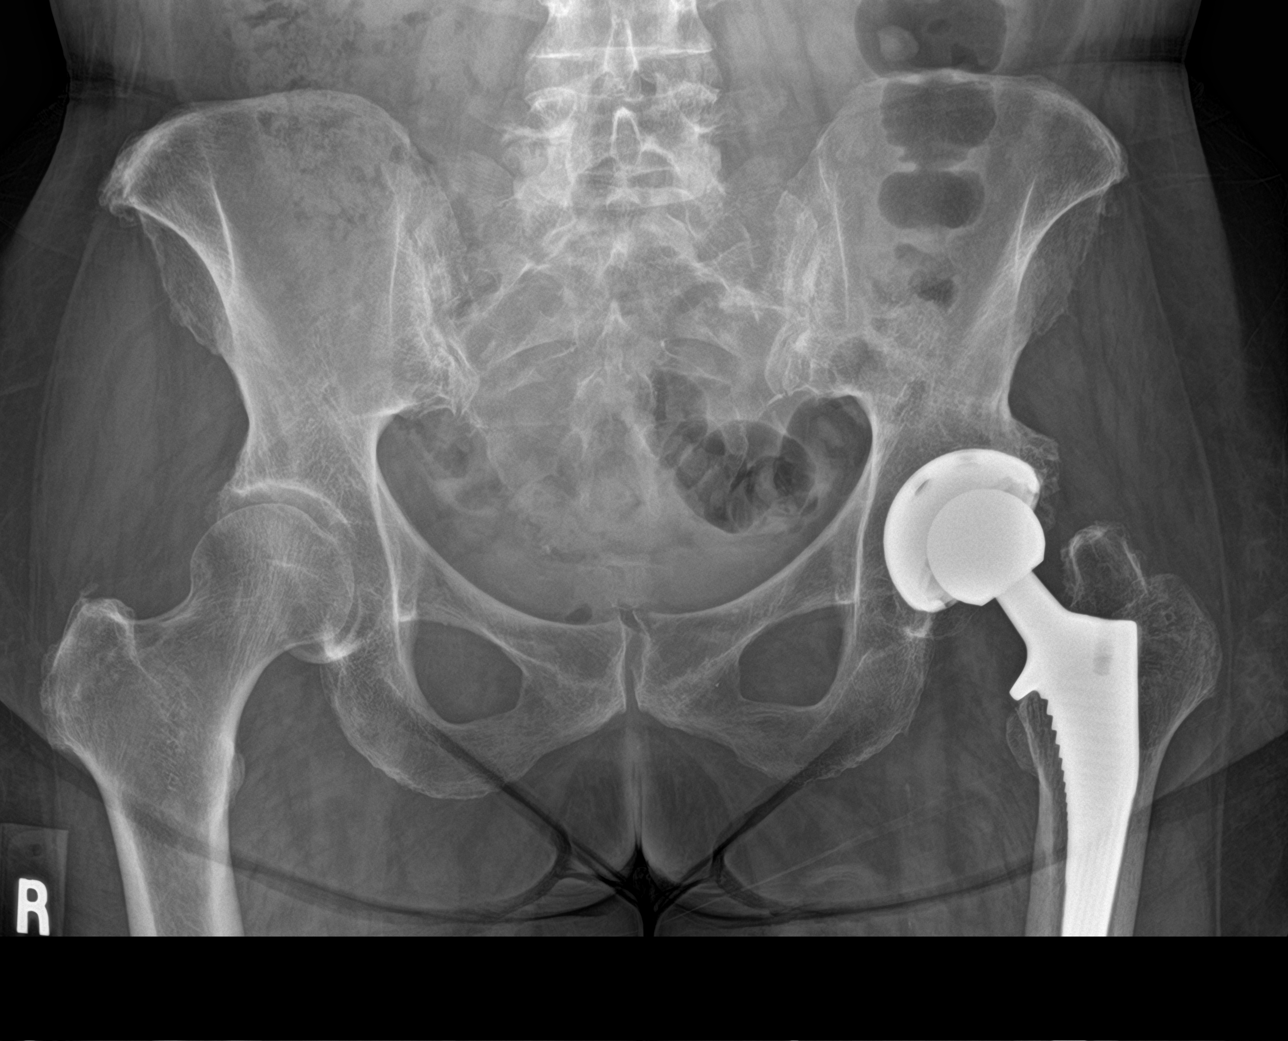

[hip ap]
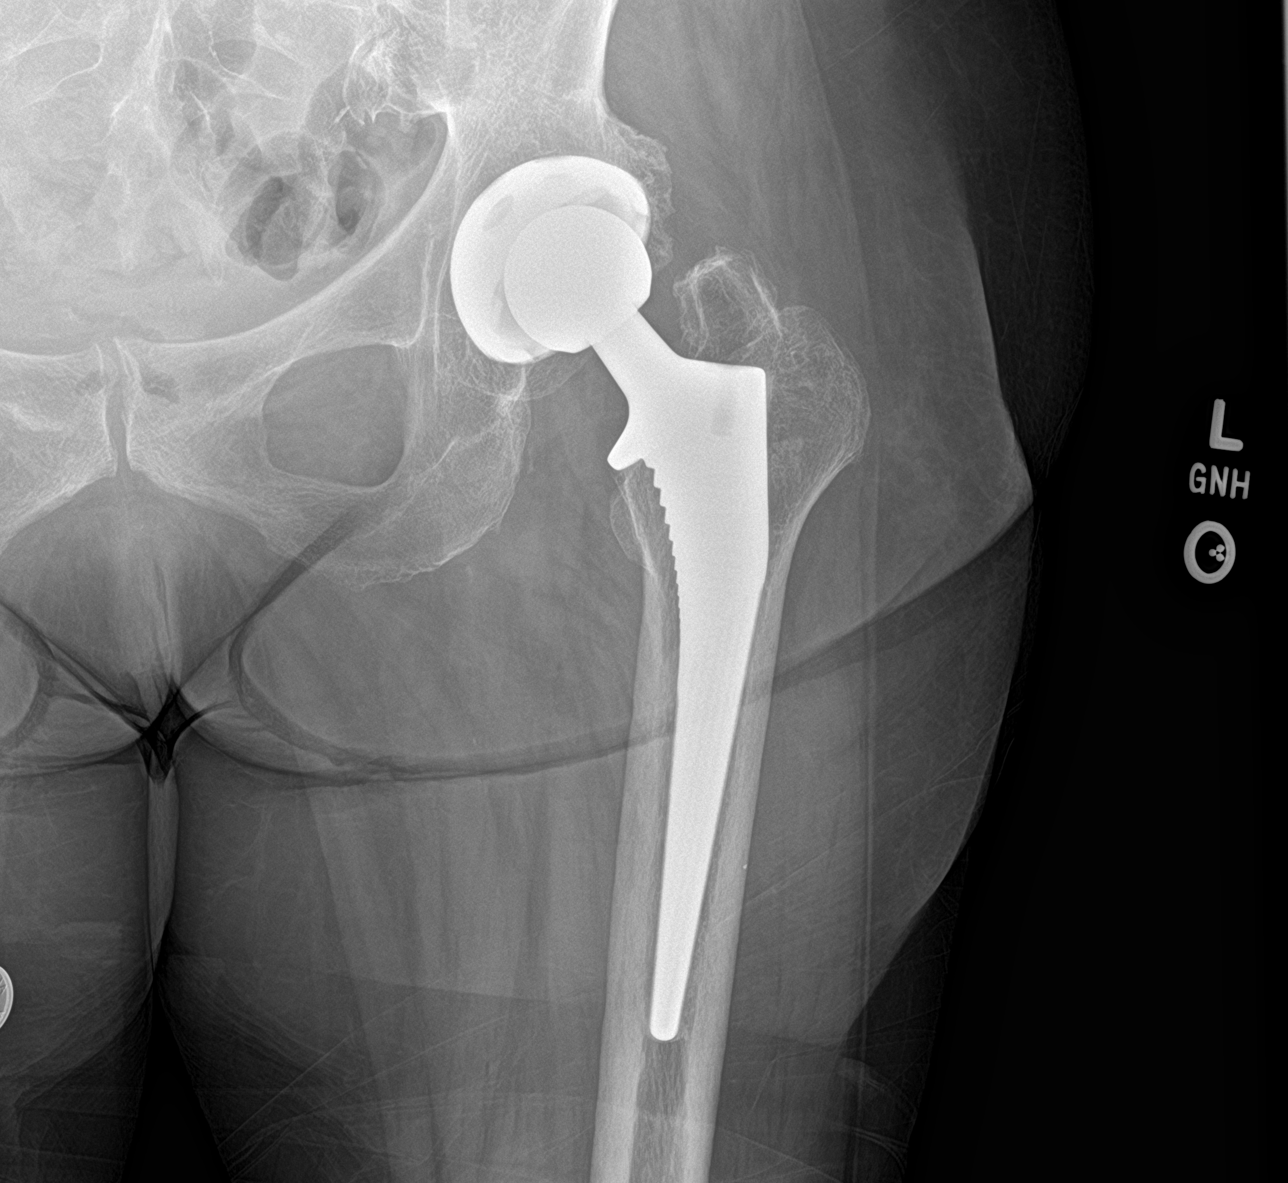

[hip lat]
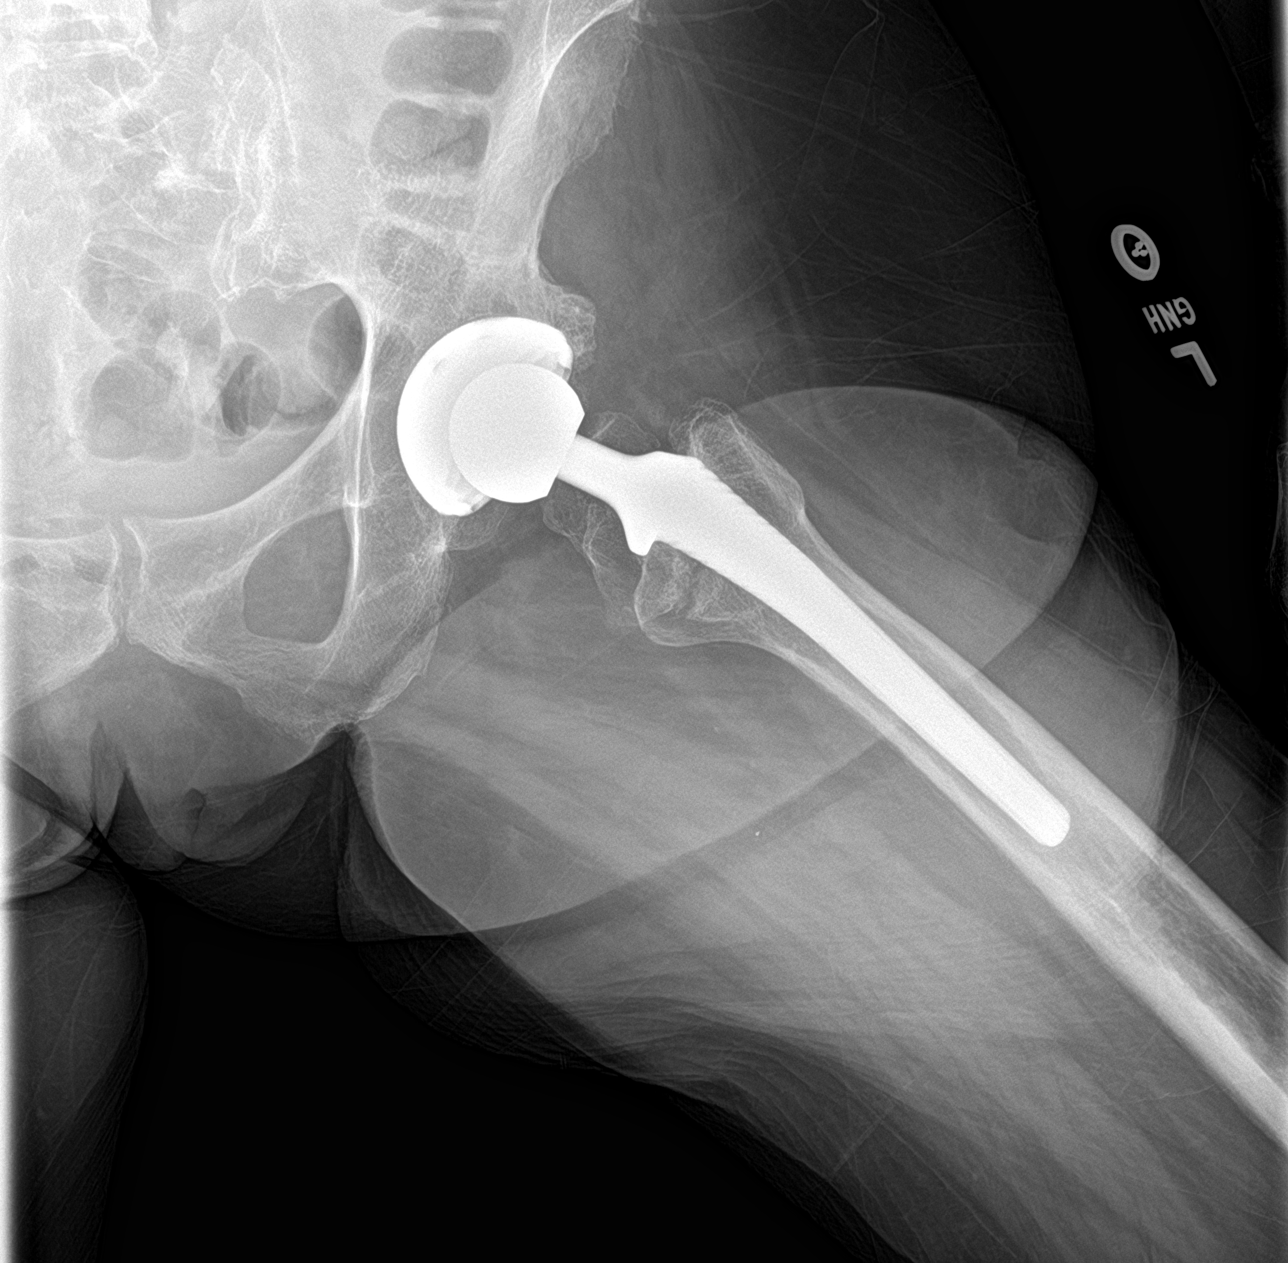

[3 of 3 positions shown; findings below may reference images not displayed]

FINDINGS: The patient is status post left hip replacement. Acetabular and
femoral components are in good position. No evidence of loosening or
failure identified. No dislocation or fracture.
IMPRESSION: No evidence of loosening. The left hip replacement is normal in
appearance.

## 2020-12-12 ENCOUNTER — Inpatient Hospital Stay: Payer: Medicare Other

## 2020-12-12 ENCOUNTER — Inpatient Hospital Stay: Payer: Medicare Other | Attending: Oncology

## 2020-12-12 ENCOUNTER — Other Ambulatory Visit: Payer: Self-pay

## 2020-12-12 VITALS — BP 130/83 | HR 115 | Temp 97.9°F | Resp 20

## 2020-12-12 DIAGNOSIS — C787 Secondary malignant neoplasm of liver and intrahepatic bile duct: Secondary | ICD-10-CM

## 2020-12-12 DIAGNOSIS — Z170 Estrogen receptor positive status [ER+]: Secondary | ICD-10-CM

## 2020-12-12 DIAGNOSIS — Z79899 Other long term (current) drug therapy: Secondary | ICD-10-CM | POA: Diagnosis not present

## 2020-12-12 DIAGNOSIS — C50911 Malignant neoplasm of unspecified site of right female breast: Secondary | ICD-10-CM

## 2020-12-12 DIAGNOSIS — C7951 Secondary malignant neoplasm of bone: Secondary | ICD-10-CM | POA: Diagnosis not present

## 2020-12-12 DIAGNOSIS — I313 Pericardial effusion (noninflammatory): Secondary | ICD-10-CM

## 2020-12-12 DIAGNOSIS — C50411 Malignant neoplasm of upper-outer quadrant of right female breast: Secondary | ICD-10-CM | POA: Insufficient documentation

## 2020-12-12 DIAGNOSIS — Z5111 Encounter for antineoplastic chemotherapy: Secondary | ICD-10-CM | POA: Insufficient documentation

## 2020-12-12 DIAGNOSIS — I3131 Malignant pericardial effusion in diseases classified elsewhere: Secondary | ICD-10-CM

## 2020-12-12 LAB — COMPREHENSIVE METABOLIC PANEL
ALT: 9 U/L (ref 0–44)
AST: 18 U/L (ref 15–41)
Albumin: 3.6 g/dL (ref 3.5–5.0)
Alkaline Phosphatase: 128 U/L — ABNORMAL HIGH (ref 38–126)
Anion gap: 11 (ref 5–15)
BUN: 28 mg/dL — ABNORMAL HIGH (ref 8–23)
CO2: 19 mmol/L — ABNORMAL LOW (ref 22–32)
Calcium: 10.3 mg/dL (ref 8.9–10.3)
Chloride: 109 mmol/L (ref 98–111)
Creatinine, Ser: 1.33 mg/dL — ABNORMAL HIGH (ref 0.44–1.00)
GFR, Estimated: 43 mL/min — ABNORMAL LOW (ref >60)
Glucose, Bld: 82 mg/dL (ref 70–99)
Potassium: 4.7 mmol/L (ref 3.5–5.1)
Sodium: 139 mmol/L (ref 135–145)
Total Bilirubin: 0.3 mg/dL (ref 0.3–1.2)
Total Protein: 8.1 g/dL (ref 6.5–8.1)

## 2020-12-12 LAB — CBC WITH DIFFERENTIAL/PLATELET
Abs Immature Granulocytes: 0.04 10*3/uL (ref 0.00–0.07)
Basophils Absolute: 0.1 10*3/uL (ref 0.0–0.1)
Basophils Relative: 2 %
Eosinophils Absolute: 0 10*3/uL (ref 0.0–0.5)
Eosinophils Relative: 0 %
HCT: 42.5 % (ref 36.0–46.0)
Hemoglobin: 14.5 g/dL (ref 12.0–15.0)
Immature Granulocytes: 1 %
Lymphocytes Relative: 17 %
Lymphs Abs: 0.9 10*3/uL (ref 0.7–4.0)
MCH: 34.6 pg — ABNORMAL HIGH (ref 26.0–34.0)
MCHC: 34.1 g/dL (ref 30.0–36.0)
MCV: 101.4 fL — ABNORMAL HIGH (ref 80.0–100.0)
Monocytes Absolute: 1.1 10*3/uL — ABNORMAL HIGH (ref 0.1–1.0)
Monocytes Relative: 22 %
Neutro Abs: 2.9 10*3/uL (ref 1.7–7.7)
Neutrophils Relative %: 58 %
Platelets: 180 10*3/uL (ref 150–400)
RBC: 4.19 MIL/uL (ref 3.87–5.11)
RDW: 15.6 % — ABNORMAL HIGH (ref 11.5–15.5)
WBC: 5 10*3/uL (ref 4.0–10.5)
nRBC: 0 % (ref 0.0–0.2)

## 2020-12-12 MED ORDER — FULVESTRANT 250 MG/5ML IM SOLN
INTRAMUSCULAR | Status: AC
  Filled 2020-12-12: qty 10

## 2020-12-12 MED ORDER — FULVESTRANT 250 MG/5ML IM SOLN
500.0000 mg | Freq: Once | INTRAMUSCULAR | Status: AC
  Administered 2020-12-12: 500 mg via INTRAMUSCULAR

## 2020-12-12 MED ORDER — DENOSUMAB 120 MG/1.7ML ~~LOC~~ SOLN
SUBCUTANEOUS | Status: AC
  Filled 2020-12-12: qty 1.7

## 2020-12-12 MED ORDER — DENOSUMAB 120 MG/1.7ML ~~LOC~~ SOLN
120.0000 mg | Freq: Once | SUBCUTANEOUS | Status: AC
  Administered 2020-12-12: 120 mg via SUBCUTANEOUS

## 2021-01-09 ENCOUNTER — Inpatient Hospital Stay: Payer: Medicare Other | Attending: Oncology

## 2021-01-09 ENCOUNTER — Other Ambulatory Visit: Payer: Self-pay

## 2021-01-09 ENCOUNTER — Inpatient Hospital Stay: Payer: Medicare Other

## 2021-01-09 VITALS — BP 140/69 | HR 107 | Temp 98.2°F | Resp 20

## 2021-01-09 DIAGNOSIS — C50911 Malignant neoplasm of unspecified site of right female breast: Secondary | ICD-10-CM

## 2021-01-09 DIAGNOSIS — C787 Secondary malignant neoplasm of liver and intrahepatic bile duct: Secondary | ICD-10-CM

## 2021-01-09 DIAGNOSIS — Z5111 Encounter for antineoplastic chemotherapy: Secondary | ICD-10-CM | POA: Insufficient documentation

## 2021-01-09 DIAGNOSIS — C7951 Secondary malignant neoplasm of bone: Secondary | ICD-10-CM

## 2021-01-09 DIAGNOSIS — Z79899 Other long term (current) drug therapy: Secondary | ICD-10-CM | POA: Diagnosis not present

## 2021-01-09 DIAGNOSIS — I3131 Malignant pericardial effusion in diseases classified elsewhere: Secondary | ICD-10-CM

## 2021-01-09 DIAGNOSIS — Z170 Estrogen receptor positive status [ER+]: Secondary | ICD-10-CM

## 2021-01-09 DIAGNOSIS — C50411 Malignant neoplasm of upper-outer quadrant of right female breast: Secondary | ICD-10-CM | POA: Insufficient documentation

## 2021-01-09 LAB — CBC WITH DIFFERENTIAL/PLATELET
Abs Immature Granulocytes: 0.06 10*3/uL (ref 0.00–0.07)
Basophils Absolute: 0.1 10*3/uL (ref 0.0–0.1)
Basophils Relative: 2 %
Eosinophils Absolute: 0 10*3/uL (ref 0.0–0.5)
Eosinophils Relative: 0 %
HCT: 40.9 % (ref 36.0–46.0)
Hemoglobin: 14 g/dL (ref 12.0–15.0)
Immature Granulocytes: 1 %
Lymphocytes Relative: 15 %
Lymphs Abs: 0.7 10*3/uL (ref 0.7–4.0)
MCH: 34.8 pg — ABNORMAL HIGH (ref 26.0–34.0)
MCHC: 34.2 g/dL (ref 30.0–36.0)
MCV: 101.7 fL — ABNORMAL HIGH (ref 80.0–100.0)
Monocytes Absolute: 0.9 10*3/uL (ref 0.1–1.0)
Monocytes Relative: 19 %
Neutro Abs: 2.9 10*3/uL (ref 1.7–7.7)
Neutrophils Relative %: 63 %
Platelets: 195 10*3/uL (ref 150–400)
RBC: 4.02 MIL/uL (ref 3.87–5.11)
RDW: 15.2 % (ref 11.5–15.5)
WBC: 4.8 10*3/uL (ref 4.0–10.5)
nRBC: 0 % (ref 0.0–0.2)

## 2021-01-09 LAB — COMPREHENSIVE METABOLIC PANEL
ALT: 9 U/L (ref 0–44)
AST: 15 U/L (ref 15–41)
Albumin: 3.6 g/dL (ref 3.5–5.0)
Alkaline Phosphatase: 130 U/L — ABNORMAL HIGH (ref 38–126)
Anion gap: 9 (ref 5–15)
BUN: 22 mg/dL (ref 8–23)
CO2: 21 mmol/L — ABNORMAL LOW (ref 22–32)
Calcium: 10.4 mg/dL — ABNORMAL HIGH (ref 8.9–10.3)
Chloride: 109 mmol/L (ref 98–111)
Creatinine, Ser: 1.18 mg/dL — ABNORMAL HIGH (ref 0.44–1.00)
GFR, Estimated: 50 mL/min — ABNORMAL LOW (ref >60)
Glucose, Bld: 119 mg/dL — ABNORMAL HIGH (ref 70–99)
Potassium: 4.3 mmol/L (ref 3.5–5.1)
Sodium: 139 mmol/L (ref 135–145)
Total Bilirubin: 0.3 mg/dL (ref 0.3–1.2)
Total Protein: 8 g/dL (ref 6.5–8.1)

## 2021-01-09 MED ORDER — DENOSUMAB 120 MG/1.7ML ~~LOC~~ SOLN
SUBCUTANEOUS | Status: AC
  Filled 2021-01-09: qty 1.7

## 2021-01-09 MED ORDER — FULVESTRANT 250 MG/5ML IM SOLN
500.0000 mg | Freq: Once | INTRAMUSCULAR | Status: AC
Start: 2021-01-09 — End: 2021-01-09
  Administered 2021-01-09: 500 mg via INTRAMUSCULAR

## 2021-01-09 MED ORDER — DENOSUMAB 120 MG/1.7ML ~~LOC~~ SOLN
120.0000 mg | Freq: Once | SUBCUTANEOUS | Status: AC
Start: 2021-01-09 — End: 2021-01-09
  Administered 2021-01-09: 120 mg via SUBCUTANEOUS

## 2021-01-09 MED ORDER — FULVESTRANT 250 MG/5ML IM SOLN
INTRAMUSCULAR | Status: AC
  Filled 2021-01-09: qty 10

## 2021-01-11 ENCOUNTER — Telehealth: Payer: Self-pay | Admitting: *Deleted

## 2021-01-11 NOTE — Telephone Encounter (Signed)
This RN spoke with pt per her VM wanting to inform MD of onset over the past 2 weeks of itching- mainly on arms, scalp and face.  She denies any rash or noted skin changes.  She states itching seems to worsen as the day progresses with most intense in the evenings- though the itching does not interfere with sleep.  She denies any new medication or supplements use.  She has made no changes in her soaps for self or clothing.  She is using moisturizing cream.  Barbara Parks states she mainly wanted Dr Jana Hakim to know- and she will monitor- she also reviewed her labs per My Chart and noted no change in liver enzymes.  Pt is scheduled for MD visit with next faslodex injection.

## 2021-01-20 IMAGING — CT CT CHEST W/ CM
2 of 3 series · 15 of 36 positions shown, 18 images · IV contrast (OMNIPAQUE)
Comparison: CT 05/13/2019

CLINICAL DATA: Contrast-O7ml omni 300 F/u metastatic rt. Breast ca
to bone with oral chemo ongoing and iv chemo completed 8228,
Surgery-rt. Mastectomy with ln, lt. Hip, lumbar^75mL OMNIPAQUE
IOHEXOL 300 MG/ML SOLNInvasive breast cancer, stage IV, recurrence,
eval

EXAM:
CT CHEST WITH CONTRAST
TECHNIQUE: Multidetector CT imaging of the chest was performed during
intravenous contrast administration.
CONTRAST:  75mL OMNIPAQUE IOHEXOL 300 MG/ML  SOLN

[Series 2: axial st · axial · 0.70mm/px · z∈[-416,-134]mm · 12 of 167 slices shown, 15 images]
[im 13/167  mediastinal]
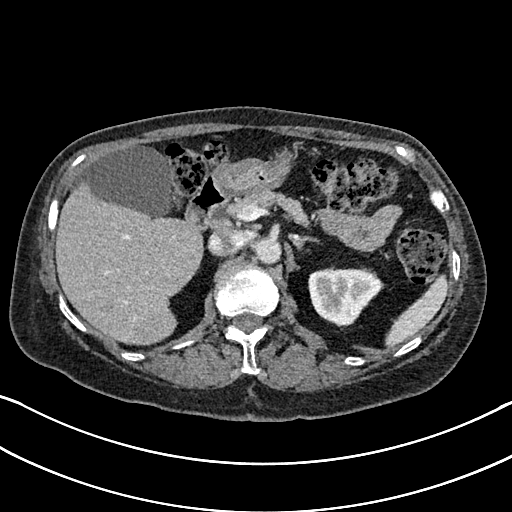
[im 13/167  lung]
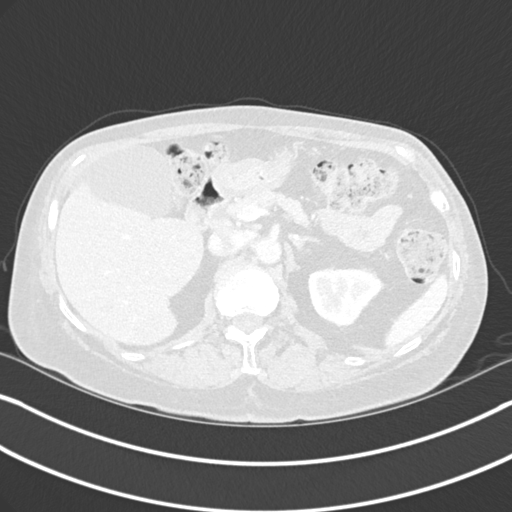
[im 25/167  lung]
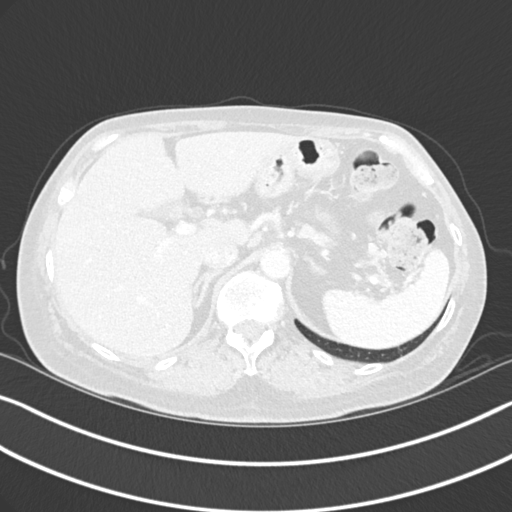
[im 37/167  lung]
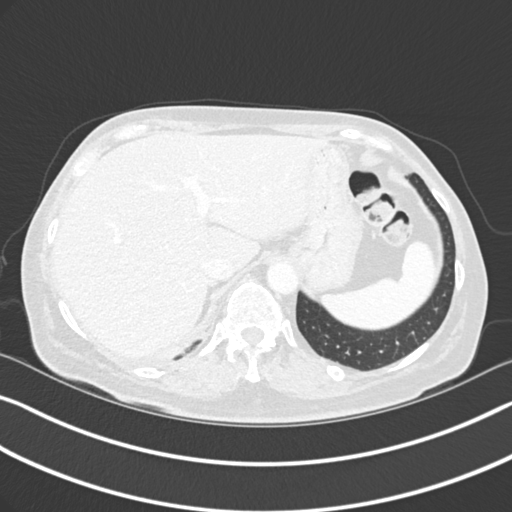
[im 50/167  lung]
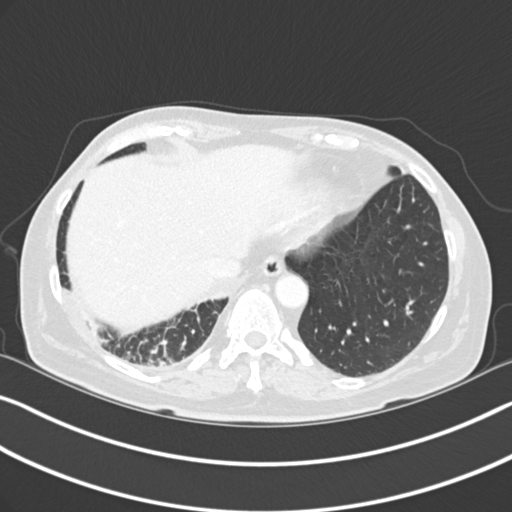
[im 62/167  mediastinal]
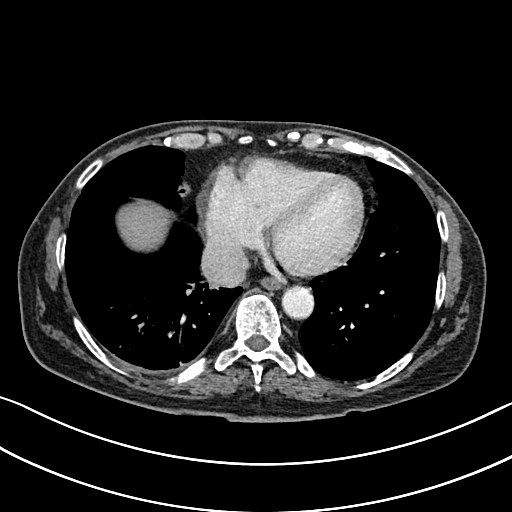
[im 62/167  lung]
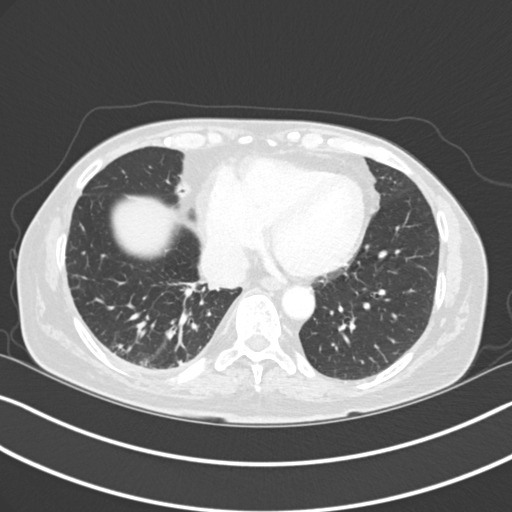
[im 74/167  lung]
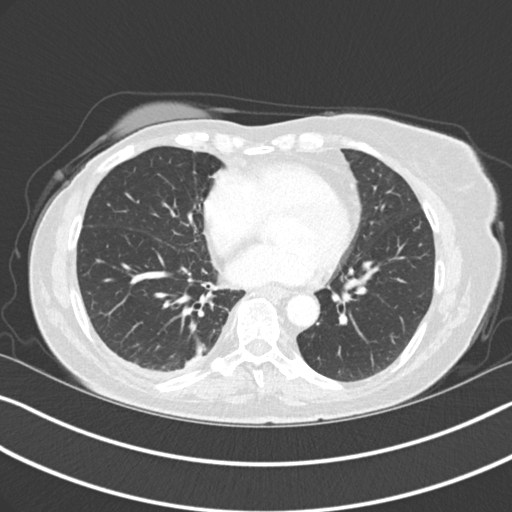
[im 93/167  lung]
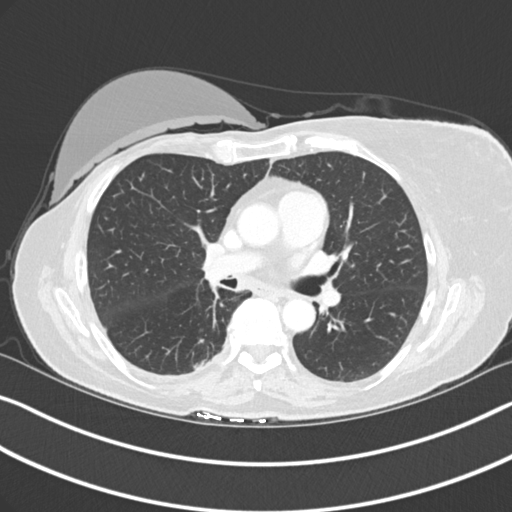
[im 105/167  lung]
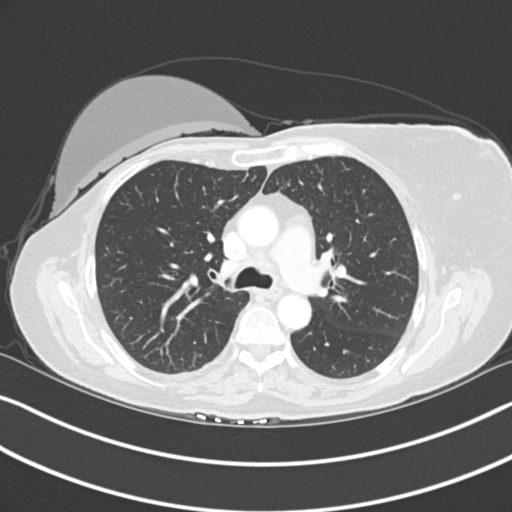
[im 117/167  mediastinal]
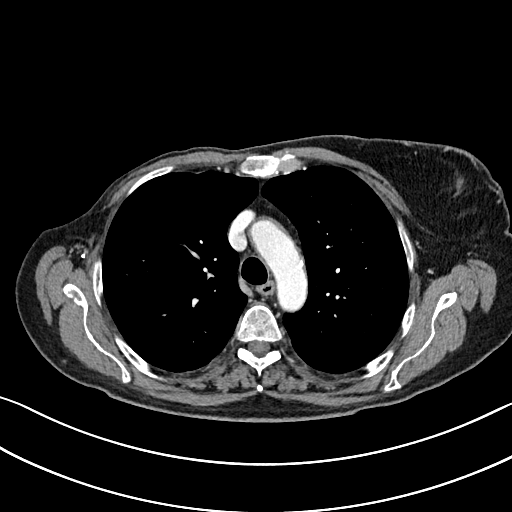
[im 117/167  lung]
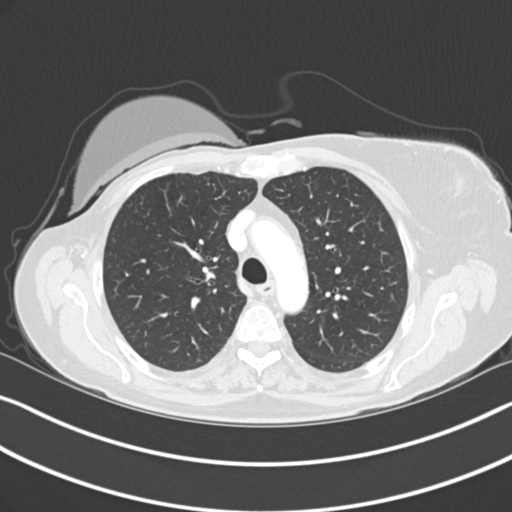
[im 130/167  lung]
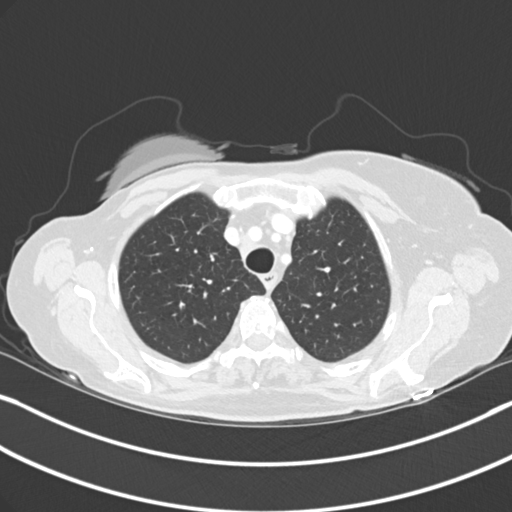
[im 142/167  lung]
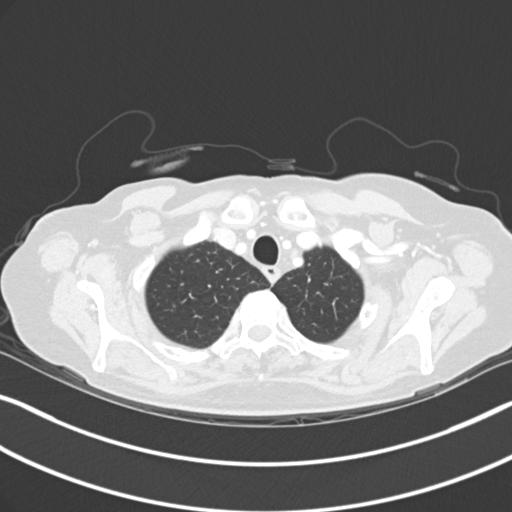
[im 154/167  lung]
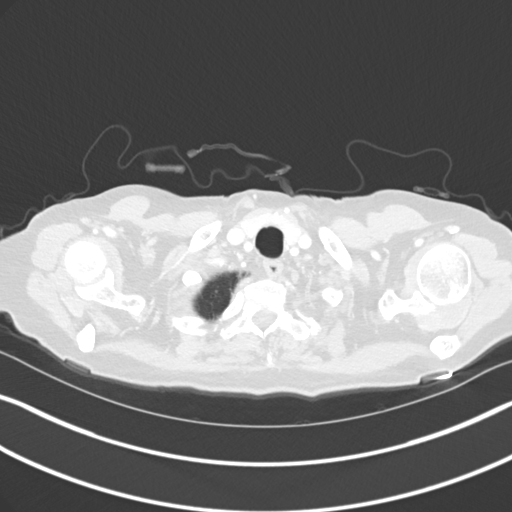

[Series 6: coronal · coronal · 0.68mm/px · 3 of 134 slices shown]
[im 27/134  lung]
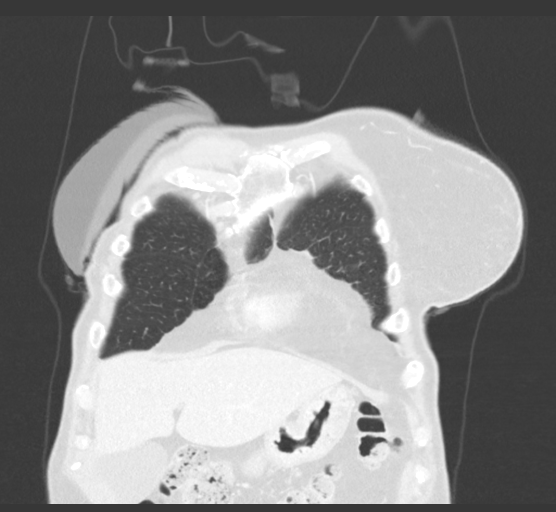
[im 54/134  lung]
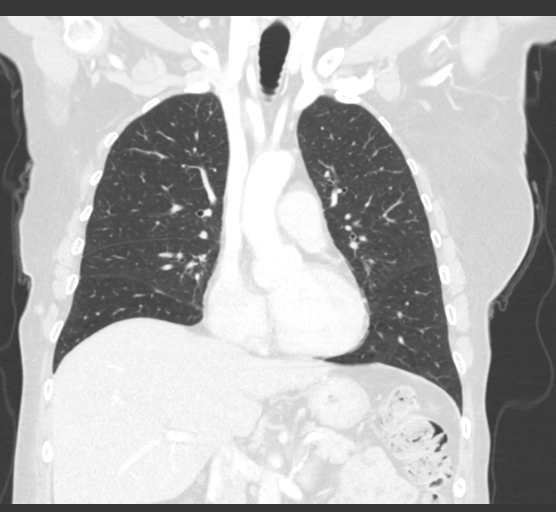
[im 80/134  lung]
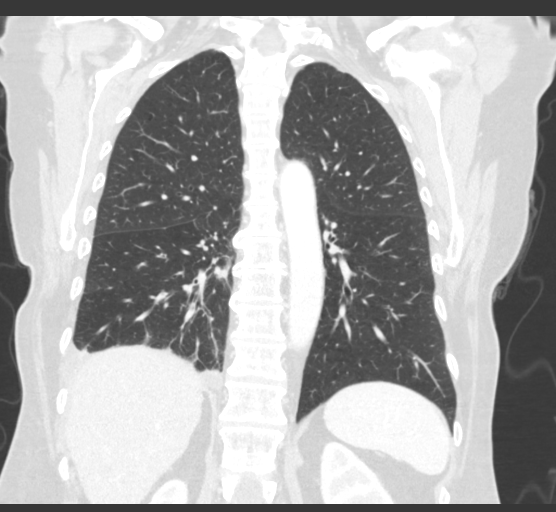

[15 of 36 positions shown; findings below may reference images not displayed]

FINDINGS: Cardiovascular: No significant vascular findings. Normal heart size.
No pericardial effusion.

Mediastinum/Nodes: RIGHT axillary nodal dissection. Post RIGHT
mastectomy anatomy. No axillary supraclavicular adenopathy. No
internal mammary adenopathy. No mediastinal hilar adenopathy.

Lungs/Pleura: Nodular pleural thickening at the RIGHT lung base
similar. Largest nodule along the pleural surface measures 16 mm
compared with 16 mm.

Small peripheral nodule in the RIGHT lower lobe measuring 3 mm
(image 113/5) is unchanged. A tiny nodule in the medial RIGHT lower
lobe (image 96/5) is unchanged. Infrahilar nodule difficult to
separate from the vasculature measuring 12 mm compares to 11 mm
(image 81/5). No new or small nodules identified.

Upper Abdomen: Low-density lesion LEFT hepatic lobe measuring 7 mm
(image 138/2). Lesion is subtly apparent on CT 05/13/2019, but not
readily apparent on remote CT scans. No clear lesion identified at
this site on comparison FDG PET scan from 12/22/2018.

Musculoskeletal: No aggressive osseous lesion.
IMPRESSION: 1. Stable pulmonary nodules in the RIGHT lung.
2. Stable subtle skeletal metastasis including the RIGHT scapula and
posterior RIGHT tenth rib.
3. Subtle low-density lesion in the LEFT hepatic lobe is
indeterminate. Recommend close attention on follow-up or further
evaluation with contrast abdominal MRI.

These results will be called to the ordering clinician or
representative by the Radiologist Assistant, and communication
documented in the PACS or zVision Dashboard.

## 2021-01-31 DIAGNOSIS — Z20822 Contact with and (suspected) exposure to covid-19: Secondary | ICD-10-CM | POA: Diagnosis not present

## 2021-02-05 NOTE — Progress Notes (Signed)
[place]  Telephone:([phone]) [phone] Fax:(336) [phone]    ID: Barbara Parks DOB: February 20, 1951  MR#: [id number]  RKY#:[id number]  Patient Care Team: [name], MD as PCP - General (Family Medicine) Jerline Pain, MD (Cardiology) Gaye Pollack, MD (Cardiothoracic Surgery) Mahoganie Basher, Virgie Dad, MD as Consulting Physician (Oncology) OTHER MD: Dr Erroll Luna- Merlene Laughter, DDS; Gaynelle Arabian MD   CHIEF COMPLAINT: metastatic breast cancer, formerly on BOLERO study (s/p right mastectomy)  CURRENT TREATMENT: Fulvestrant, ribociclib, denosumab/Xgeva   INTERVAL HISTORY: Barbara Parks returns today for follow-up and treatment of her metastatic breast cancer.   She was switched to ribociclib at her last visit on 08/22/2020.  She has had some itching particularly over the outside of the lower arms, and her head and neck, which suggest that sun exposure may be associated with this.  She also complains of fatigue which may well be exacerbated by the medication.  She continues on fulvestrant every 4 weeks and is tolerating this quite well.  She is also receiving xgeva every 4 weeks with no issues.  She has no dental concners at present.  She is up to date on mammography, most recently on 04/13/2020.    REVIEW OF SYSTEMS: Barbara Parks was able to play 9 holes of golf on a recent day but it was quite painful when she got home and she felt exhausted.  She has increasing pain in her right hip and lower back.  There have been no falls and no balance issues.  She denies unusual headaches visual changes cough phlegm production pleurisy shortness of breath or change in bowel or bladder habits.  A detailed review of systems was otherwise stable   COVID 19 VACCINATION STATUS: Refuses vaccination   BREAST CANCER HISTORY: From Dr. Collier [name]'s original intake note 04/13/2004:  "This woman has been in good health all of her life. She recently moved from [place] to work here.  She palpated a mass at  about the 12 o'clock position in mid-July. She has not noticed any nipple retraction or skin changes.  She was seen by her primary care doctor who subsequently referred her for a mammogram.  Mammogram was performed on 03/01/04. This demonstrated a spiculated 2 cm mass at the 12 o'clock position in the right breast.  Upper outer quadrant of the left breast shows some distortion.   Physical exam at that time showed a firm, nontender nodule at the 12 o'clock position in the right breast, 5 cm from the nipple.  Ultrasound of this area showed a hypoechoic ill-defined mass, measuring 2.2 x 1.3 x 1.4 cm.  Physical exam of the left breast showed general vague thickening, upper outer quadrant of the left breast with a discrete palpable mass. The ultrasound performed showed a single hypoechoic ill-defined nodule at the 1 o'clock position, measuring 7 x 5 x 6 mm.    She had biopsies of both lesions on 03/02/03.  Needle core biopsy of the lesion on the right breast revealed invasive mammary carcinoma.  Needle core biopsy of the left breast showed a complex fibroadenoma.  Prognostic panel of the lesion on the right breast showed it to be ER positive at 73%, PR positive at 90% and proliferative index 9%, HER-2 was 1+.  Patient was referred to Dr. Annamaria Boots, who performed a simple mastectomy with sentinel lymph node evaluation on 03/22/04.  Final pathology showed this to be an invasive ductal carcinoma  with lobular features, measuring 2.2 cm, grade 2 of 3.  Margins negative for  carcinoma.  Invasive ductal carcinoma was extended to involve deep dermis of the nipple.  Lymphovascular invasion was identified.  Total of 4 sentinel lymph nodes were evaluated.  Touch imprints at the time of the OR was felt to be negative.  Subsequent evaluation showed a 5 mm focus of metastatic carcinoma in one of the four lymph nodes on microscopic after sectioning.  There was extracapsular extension  of one lymph node as well. The remaining three lymph nodes  were all negative."  The patient's subsequent history is detailed above.   PAST MEDICAL HISTORY: Past Medical History:  Diagnosis Date   Arthritis    left hip   Asthma    breast ca 2005   breast/chemo R mastectomy   Dermatitis    Diabetes mellitus without complication ([place])    GERD (gastroesophageal reflux disease)    Hypercholesteremia    Metastasis to lung ([place]) dx'd 08/2011   Osteopenia due to cancer therapy 09/09/2013   Palpitations    Personal history of chemotherapy    Shortness of breath     PAST SURGICAL HISTORY: Past Surgical History:  Procedure Laterality Date   BREAST SURGERY  2005   right   CATARACT EXTRACTION W/PHACO Left 01/18/2014   Procedure: CATARACT EXTRACTION PHACO AND INTRAOCULAR LENS PLACEMENT ([place]);  Surgeon: Elta Guadeloupe T. Gershon Crane, MD;  Location: [facility];  Service: Ophthalmology;  Laterality: Left;  CDE 15.79   CATARACT EXTRACTION W/PHACO Right 02/08/2014   Procedure: CATARACT EXTRACTION PHACO AND INTRAOCULAR LENS PLACEMENT ([facility]);  Surgeon: Elta Guadeloupe T. Gershon Crane, MD;  Location: [facility];  Service: Ophthalmology;  Laterality: Right;  CDE 4.41   CHEST TUBE INSERTION  09/11/2011   Procedure: INSERTION PLEURAL DRAINAGE CATHETER;  Surgeon: Gaye Pollack, MD;  Location: [facility];  Service: Thoracic;  Laterality: Right;   MASTECTOMY Right    PERICARDIAL WINDOW  09/11/2011   Procedure: PERICARDIAL WINDOW;  Surgeon: Gaye Pollack, MD;  Location: [facility];  Service: Thoracic;  Laterality: N/A;   REMOVAL OF PLEURAL DRAINAGE CATHETER  12/19/2011   Procedure: REMOVAL OF PLEURAL DRAINAGE CATHETER;  Surgeon: Gaye Pollack, MD;  Location: [facility];  Service: Thoracic;  Laterality: Right;  TO BE DONE IN MINOR ROOM, [place] Left 06/07/2015   Procedure: LEFT TOTAL HIP ARTHROPLASTY ANTERIOR APPROACH;  Surgeon: Gaynelle Arabian, MD;  Location: [facility];  Service: Orthopedics;  Laterality: Left;   VIDEO BRONCHOSCOPY  09/11/2011   Procedure: VIDEO  BRONCHOSCOPY;  Surgeon: Gaye Pollack, MD;  Location: [facility];  Service: Thoracic;  Laterality: N/A;    FAMILY HISTORY Family History  Problem Relation Age of Onset   Cancer Mother        breast   Heart disease Father    Diabetes Other    Anesthesia problems Neg Hx   The patient's mother was diagnosed with breast cancer at age 64, she died age 31 from congestive heart failure.The patient's father died from heart disease at age 32.  She has one sister alive & well.  Two brothers alive & well, one with diabetes. The patient's sister was also diagnosed with breast cancer, and was tested for the BRCA gene, and was negative. The patient herself has not been tested. There is no history of ovarian cancer in the family   GYNECOLOGIC HISTORY:  No LMP recorded. Patient is postmenopausal. Menarche age 58, the patient is GX P0. She stopped having periods with chemotherapy in 2005. 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 She was a Archivist. She is single, lives alone with her Shitzu-poodle Sammie.. Family is all in the [place] area.     ADVANCED DIRECTIVES: Not in place. At the 02/23/2014 visit the patient was given the appropriate documents to complete and notarize at her discretion. She tells me she is planning to name her sister, Billie Ruddy, as healthcare power of attorney. [name] can be reached at [phone]   HEALTH MAINTENANCE: Social History   Tobacco Use   Smoking status: Former    Packs/day: 1.50    Years: 30.00    Pack years: 45.00    Types: Cigarettes    Quit date: 09/10/2007    Years since quitting: 13.4   Smokeless tobacco: Never  Substance Use Topics   Alcohol use: No   Drug use: No    Colonoscopy:  PAP:  Bone density: 04/09/2018, T score -1.8  Lipid panel:  Allergies  Allergen Reactions   Aspirin     REACTION: upset stomach   Latex Other  (See Comments)    Blistering and skin peels off   Nsaids Nausea And Vomiting    Extreme nausea and vomiting    Current Outpatient Medications  Medication Sig Dispense Refill   cholecalciferol (VITAMIN D3) 25 MCG (1000 UT) tablet Take 2 tablets (2,000 Units total) by mouth daily. 180 tablet 4   cyclobenzaprine (FLEXERIL) 10 MG tablet Take 1 tablet (10 mg total) by mouth 2 (two) times daily as needed for muscle spasms. 20 tablet 0   gemfibrozil (LOPID) 600 MG tablet TAKE 1 TABLET (600 MG TOTAL) BY MOUTH 2 (TWO) TIMES DAILY BEFORE A MEAL. 180 tablet 1   loperamide (IMODIUM) 2 MG capsule Take 2 mg by mouth 4 (four) times daily as needed for diarrhea or loose stools. Reported on 11/30/2015     metFORMIN (GLUCOPHAGE-XR) 500 MG 24 hr tablet Take 1 tablet (500 mg total) by mouth 3 (three) times daily before meals.  3   prochlorperazine (COMPAZINE) 10 MG tablet Take 1 tablet (10 mg total) by mouth every 8 (eight) hours as needed for nausea or vomiting. 30 tablet 0   ribociclib succ (KISQALI 600MG DAILY DOSE) 200 MG Therapy Pack TAKE 3 TABLETS (600 MG TOTAL) BY MOUTH DAILY. TAKE FOR 21 DAYS ON, 7 DAYS OFF, REPEAT EVERY 28 DAYS. START 08/23/2020 63 each 0   traMADol (ULTRAM) 50 MG tablet Take 0.5-1 tablets (25-50 mg total) by mouth every 6 (six) hours as needed. 30 tablet 0   No current facility-administered medications for this visit.    OBJECTIVE: White woman who appears younger than stated age 18:   02/06/21 1142  BP: 116/66  Pulse: (!) 110  Resp: 18  Temp: (!) 97.5 F (36.4 C)  SpO2: 97%   Wt Readings from Last 3 Encounters:  02/06/21 150 lb 12.8 oz (68.4 kg)  11/14/20 153 lb (69.4 kg)  10/17/20 155 lb 1.6 oz (70.4 kg)   Body mass index is 25.49 kg/m.    ECOG FS:1 - Symptomatic but completely ambulatory  Sclerae unicteric, EOMs intact Wearing a mask No cervical or supraclavicular adenopathy Lungs no rales or rhonchi Heart regular rate and rhythm Abd soft, nontender, positive  bowel sounds MSK no focal spinal tenderness, no upper extremity lymphedema Neuro: nonfocal, well oriented, appropriate affect Breasts: Deferred   LAB  RESULTS:  CMP     Component Value Date/Time   NA 139 02/06/2021 1121   NA 139 07/08/2017 0942   K 4.5 02/06/2021 1121   K 3.8 07/08/2017 0942   CL 109 02/06/2021 1121   CL 109 (H) 12/31/2012 0940   CO2 22 02/06/2021 1121   CO2 18 (L) 07/08/2017 0942   GLUCOSE 120 (H) 02/06/2021 1121   GLUCOSE 156 (H) 07/08/2017 0942   GLUCOSE 127 (H) 12/31/2012 0940   BUN 17 02/06/2021 1121   BUN 17.7 07/08/2017 0942   CREATININE 1.23 (H) 02/06/2021 1121   CREATININE 1.18 (H) 12/25/2017 0959   CREATININE 1.0 07/08/2017 0942   CALCIUM 10.2 02/06/2021 1121   CALCIUM 10.4 07/08/2017 0942   PROT 8.0 02/06/2021 1121   PROT 7.7 07/08/2017 0942   ALBUMIN 3.5 02/06/2021 1121   ALBUMIN 3.4 (L) 07/08/2017 0942   AST 17 02/06/2021 1121   AST 50 (H) 12/25/2017 0959   AST 54 (H) 07/08/2017 0942   ALT 7 02/06/2021 1121   ALT 53 12/25/2017 0959   ALT 39 07/08/2017 0942   ALKPHOS 134 (H) 02/06/2021 1121   ALKPHOS 232 (H) 07/08/2017 0942   BILITOT 0.3 02/06/2021 1121   BILITOT 0.3 12/25/2017 0959   BILITOT 0.42 07/08/2017 0942   GFRNONAA 47 (L) 02/06/2021 1121   GFRNONAA 47 (L) 12/25/2017 0959   GFRAA 49 (L) 04/05/2020 1504   GFRAA 54 (L) 12/25/2017 0959    I No results found for: SPEP  Lab Results  Component Value Date   WBC 5.0 02/06/2021   NEUTROABS 3.2 02/06/2021   HGB 14.2 02/06/2021   HCT 40.6 02/06/2021   MCV 101.5 (H) 02/06/2021   PLT 193 02/06/2021      Chemistry      Component Value Date/Time   NA 139 02/06/2021 1121   NA 139 07/08/2017 0942   K 4.5 02/06/2021 1121   K 3.8 07/08/2017 0942   CL 109 02/06/2021 1121   CL 109 (H) 12/31/2012 0940   CO2 22 02/06/2021 1121   CO2 18 (L) 07/08/2017 0942   BUN 17 02/06/2021 1121   BUN 17.7 07/08/2017 0942   CREATININE 1.23 (H) 02/06/2021 1121   CREATININE 1.18 (H) 12/25/2017  0959   CREATININE 1.0 07/08/2017 0942      Component Value Date/Time   CALCIUM 10.2 02/06/2021 1121   CALCIUM 10.4 07/08/2017 0942   ALKPHOS 134 (H) 02/06/2021 1121   ALKPHOS 232 (H) 07/08/2017 0942   AST 17 02/06/2021 1121   AST 50 (H) 12/25/2017 0959   AST 54 (H) 07/08/2017 0942   ALT 7 02/06/2021 1121   ALT 53 12/25/2017 0959   ALT 39 07/08/2017 0942   BILITOT 0.3 02/06/2021 1121   BILITOT 0.3 12/25/2017 0959   BILITOT 0.42 07/08/2017 0942       Lab Results  Component Value Date   LABCA2 58 (H) 09/14/2012    No components found for: [id number]  No results for input(s): INR in the last 168 hours.  Urinalysis    Component Value Date/Time   COLORURINE YELLOW 11/25/2017 0901   APPEARANCEUR HAZY (A) 11/25/2017 0901   LABSPEC 1.017 11/25/2017 0901   LABSPEC 1.030 07/13/2015 0841   PHURINE 5.0 11/25/2017 0901   GLUCOSEU NEGATIVE 11/25/2017 0901   GLUCOSEU Negative 07/13/2015 0841   HGBUR MODERATE (A) 11/25/2017 0901   BILIRUBINUR NEGATIVE 11/25/2017 0901   BILIRUBINUR Negative 07/13/2015 0841   KETONESUR NEGATIVE 11/25/2017 0901   PROTEINUR 100 (A) 11/25/2017 0901  UROBILINOGEN 0.2 07/13/2015 0841   NITRITE NEGATIVE 11/25/2017 0901   LEUKOCYTESUR NEGATIVE 11/25/2017 0901   LEUKOCYTESUR Negative 07/13/2015 0841    STUDIES: No results found.    ASSESSMENT: 70 y.o. Barbara Parks, Barbara Parks woman with stage IV breast cancer, on BOLERO-4 trial  (1) status post right mastectomy and sentinel lymph node sampling 03/22/2004 for a right upper-outer quadrant pT2 pN1, stage IIB invasive ductal carcinoma with lobular features, grade 2, estrogen receptor and progesterone receptor positive, HER-2 negative, with an MIB-1 of 9% ([id number] and [id number])  (2) addition all right axillary lymph node sampling 05/07/2004 showed 2 benign lymph nodes (4 lymph nodes previously removed, so total was one positive lymph node out of 6; [id number])  (3) the patient was evaluated by radiation oncology; no  postmastectomy radiation recommended  (4) adjuvant chemotherapy with dose dense doxorubicin and cyclophosphamide x4 cycles (first cycle delayed one week) followed by dose dense paclitaxel x4 was completed 09/18/2004  (5) tamoxifen started March 2006, discontinued 2009  METASTATIC DISEASE: February 2013 (6) presenting with a large pericardial effusion, large right pleural effusion and possible right middle lobe bronchial obstruction February 2013, status post pericardial window placement, fiberoptic bronchoscopy and right Pleurx placement 09/11/2011, with biopsy of the bronchus intermedius and pericardium positive for metastatic breast cancer, estrogen receptor 91% positive with moderate staining intensity, progesterone receptor 100% positive with strong staining intensity, with an MIB-1 of 35%, and no HER-2 amplification, the signals ratio being 1.37 ([id number] [id number])  (7) enrolled in BOLERO-4 trial 10/10/2011, receiving letrozole and everolimus  (a) two small areas of enhancement in the cerebellum noted by brain MRI 10/03/2011 were no longer apparent on repeat MRI 08/21/2012-- most recent brain MRI 04/01/2014 showed no evidence of intracranial metastatic disease  (b) sclerotic lesions in left iliac bone and sacrum have not been biopsied; stable; to start zolendronate after patient updates her dental care (extraction planned)--never started  (c) RLL lung nodule, stable (rescanned 07/12/2014 and 08/11/2014) R hilar and subcarinal lymph nodes: stable  (d) CT of the chest:03/25/2018, stable  (e) CT Angio 12/12/2018 stable  (f) off BOLERO trial (trial ended) as of July 2020, but continuing on study drugs  (g) letrozole and everolimus discontinued January 2021 with disease progression  (8) additional problems:  (a) hepatic steatosis  (b) COPD/ emphysema/ asthma  (c) advanced L hip osteoarthritis, status post left total hip replacement 06/07/2015  (d) aortoiliac atherosclerosis  (e) dental evaluation  pending w possible dental extractions  (f) likely thalassemia trait  (g) hyperlipidemia  (9) Bone density concerns: DEXA scan 08/11/2013 was normal  (a) repeat DEXA scan 04/09/2018 shows a T score of -1.8, osteopenia  (10) disease monitoring:  (a) PET scan 12/22/2018 shows no hypermetabolic soft tissue metastases but multifocal bony metastasis  (b) denosumab/Xgeva started on 02/25/2019, repeated every 28 days (delayed due to dental extraction healing issues)  (c) bone scan 05/13/2019 serves as baseline study with increased activity in the right scapular, lower ribs and right femoral head (stable compared to PET scan of May 2020  (d) bone scan 07/09/2019 stable  (e) CT chest 07/09/2019 shows stable lung lesions but emerging left liver lobe 0.7 cm lesion  (f) liver MRI 08/03/2019 shows approximately 8 liver lesions, the largest measuring 1.8 cm.  (g) liver biopsy 08/06/2019 confirms metastatic breast cancer, estrogen and progesterone receptor positive, with an MIB-1 of 5% and HER-2 not amplified  (h) chest CT and bone scan 11/11/2019 essentially stable  (i) liver MRI 02/01/2020 stable to improved  (j)  chest CT and bone scan 04/26/2020 stable  (11) denosumab/Xgeva begun 02/25/2019 repeated every 4 weeks  (12) fulvestrant started 08/24/2019, repeated every 4 weeks  (a) palbociclib 125 mg daily 21 days on 7 days off started 08/25/2019  (b) chest CT 07/24/2020 stable  (c) liver MRI 07/25/2020 shows mild progression in measurable disease  (d) palbociclib discontinued and ribociclib substituted starting 08/22/2020   (i) QTc on 08/22/2020 was 430   (ii) QTc on 10/17/2020 was 441  (e) liver MRI 11/13/2020 shows stable disease  (13) foundation 1 requested on liver biopsy from 08/16/2019 shows a stable microsatellite status with 1 mutation/MB, amplification of C11orf30, FGF10 and RICTOR, with mutations in ESR 1 and PTEN   PLAN: Marciana appears to be doing moderately well on her current treatments.   She does have some side effects including fatigue and some weight loss which are of concern.  The itching I think is going to be related to sun exposure in the setting of these medications and I have asked her to wear long sleeves that are at least 50, hat, and sunscreen on her face.  She already has the medication for the current cycle so we are proceeding with that, I have put her in for repeat bone scan and MRI of the liver before she starts a cycle after this 1.  She will see me in 4 weeks with her next fulvestrant dose to discuss those results.  Today I ordered some plain films of the pelvis and right hip.  The reading has not been finalized.  I do not see any evidence of fracture or significant involvement of tumor in the right hip area by my reading.  Total encounter time 35 minutes.Sarajane Jews C. Jamichael Knotts, MD 02/06/21 5:50 PM Medical Oncology and Hematology [facility] [name], [place] [address] Tel. [phone]    Fax. [phone]   I, Wilburn Mylar, am acting as scribe for Dr. Virgie Dad. Andyn Sales.  I, Lurline Del MD, have reviewed the above documentation for accuracy and completeness, and I agree with the above.   *Total Encounter Time as defined by the Centers for Medicare and Medicaid Services includes, in addition to the face-to-face time of a patient visit (documented in the note above) non-face-to-face time: obtaining and reviewing outside history, ordering and reviewing medications, tests or procedures, care coordination (communications with other health care professionals or caregivers) and documentation in the medical record.

## 2021-02-06 ENCOUNTER — Ambulatory Visit (HOSPITAL_COMMUNITY)
Admission: RE | Admit: 2021-02-06 | Discharge: 2021-02-06 | Disposition: A | Discharge status: Home / Self Care | Payer: Medicare Other | Source: Ambulatory Visit | Attending: Oncology | Admitting: Oncology

## 2021-02-06 ENCOUNTER — Inpatient Hospital Stay: Payer: Medicare Other

## 2021-02-06 ENCOUNTER — Other Ambulatory Visit: Payer: Self-pay

## 2021-02-06 ENCOUNTER — Inpatient Hospital Stay: Payer: Medicare Other | Attending: Oncology

## 2021-02-06 ENCOUNTER — Inpatient Hospital Stay (HOSPITAL_BASED_OUTPATIENT_CLINIC_OR_DEPARTMENT_OTHER): Payer: Medicare Other | Admitting: Oncology

## 2021-02-06 VITALS — BP 116/66 | HR 110 | Temp 97.5°F | Resp 18 | Wt 150.8 lb

## 2021-02-06 DIAGNOSIS — I313 Pericardial effusion (noninflammatory): Secondary | ICD-10-CM

## 2021-02-06 DIAGNOSIS — M47816 Spondylosis without myelopathy or radiculopathy, lumbar region: Secondary | ICD-10-CM | POA: Diagnosis not present

## 2021-02-06 DIAGNOSIS — Z170 Estrogen receptor positive status [ER+]: Secondary | ICD-10-CM

## 2021-02-06 DIAGNOSIS — C7800 Secondary malignant neoplasm of unspecified lung: Secondary | ICD-10-CM

## 2021-02-06 DIAGNOSIS — C7951 Secondary malignant neoplasm of bone: Secondary | ICD-10-CM

## 2021-02-06 DIAGNOSIS — C50411 Malignant neoplasm of upper-outer quadrant of right female breast: Secondary | ICD-10-CM

## 2021-02-06 DIAGNOSIS — C801 Malignant (primary) neoplasm, unspecified: Secondary | ICD-10-CM

## 2021-02-06 DIAGNOSIS — I3131 Malignant pericardial effusion in diseases classified elsewhere: Secondary | ICD-10-CM

## 2021-02-06 DIAGNOSIS — M25551 Pain in right hip: Secondary | ICD-10-CM | POA: Diagnosis not present

## 2021-02-06 DIAGNOSIS — C50911 Malignant neoplasm of unspecified site of right female breast: Secondary | ICD-10-CM

## 2021-02-06 DIAGNOSIS — C787 Secondary malignant neoplasm of liver and intrahepatic bile duct: Secondary | ICD-10-CM

## 2021-02-06 LAB — CBC WITH DIFFERENTIAL/PLATELET
Abs Immature Granulocytes: 0.03 10*3/uL (ref 0.00–0.07)
Basophils Absolute: 0.1 10*3/uL (ref 0.0–0.1)
Basophils Relative: 2 %
Eosinophils Absolute: 0 10*3/uL (ref 0.0–0.5)
Eosinophils Relative: 1 %
HCT: 40.6 % (ref 36.0–46.0)
Hemoglobin: 14.2 g/dL (ref 12.0–15.0)
Immature Granulocytes: 1 %
Lymphocytes Relative: 15 %
Lymphs Abs: 0.8 10*3/uL (ref 0.7–4.0)
MCH: 35.5 pg — ABNORMAL HIGH (ref 26.0–34.0)
MCHC: 35 g/dL (ref 30.0–36.0)
MCV: 101.5 fL — ABNORMAL HIGH (ref 80.0–100.0)
Monocytes Absolute: 0.9 10*3/uL (ref 0.1–1.0)
Monocytes Relative: 18 %
Neutro Abs: 3.2 10*3/uL (ref 1.7–7.7)
Neutrophils Relative %: 63 %
Platelets: 193 10*3/uL (ref 150–400)
RBC: 4 MIL/uL (ref 3.87–5.11)
RDW: 14.8 % (ref 11.5–15.5)
WBC: 5 10*3/uL (ref 4.0–10.5)
nRBC: 0 % (ref 0.0–0.2)

## 2021-02-06 LAB — COMPREHENSIVE METABOLIC PANEL WITH GFR
ALT: 7 U/L (ref 0–44)
AST: 17 U/L (ref 15–41)
Albumin: 3.5 g/dL (ref 3.5–5.0)
Alkaline Phosphatase: 134 U/L — ABNORMAL HIGH (ref 38–126)
Anion gap: 8 (ref 5–15)
BUN: 17 mg/dL (ref 8–23)
CO2: 22 mmol/L (ref 22–32)
Calcium: 10.2 mg/dL (ref 8.9–10.3)
Chloride: 109 mmol/L (ref 98–111)
Creatinine, Ser: 1.23 mg/dL — ABNORMAL HIGH (ref 0.44–1.00)
GFR, Estimated: 47 mL/min — ABNORMAL LOW (ref >60)
Glucose, Bld: 120 mg/dL — ABNORMAL HIGH (ref 70–99)
Potassium: 4.5 mmol/L (ref 3.5–5.1)
Sodium: 139 mmol/L (ref 135–145)
Total Bilirubin: 0.3 mg/dL (ref 0.3–1.2)
Total Protein: 8 g/dL (ref 6.5–8.1)

## 2021-02-06 MED ORDER — FULVESTRANT 250 MG/5ML IM SOLN
500.0000 mg | Freq: Once | INTRAMUSCULAR | Status: AC
Start: 2021-02-06 — End: 2021-02-06
  Administered 2021-02-06: 500 mg via INTRAMUSCULAR

## 2021-02-06 MED ORDER — DENOSUMAB 120 MG/1.7ML ~~LOC~~ SOLN
120.0000 mg | Freq: Once | SUBCUTANEOUS | Status: AC
  Administered 2021-02-06: 120 mg via SUBCUTANEOUS

## 2021-02-06 MED ORDER — DENOSUMAB 120 MG/1.7ML ~~LOC~~ SOLN
SUBCUTANEOUS | Status: AC
  Filled 2021-02-06: qty 1.7

## 2021-02-06 MED ORDER — FULVESTRANT 250 MG/5ML IM SOLN
INTRAMUSCULAR | Status: AC
  Filled 2021-02-06: qty 10

## 2021-02-06 NOTE — Patient Instructions (Signed)
Denosumab injection What is this medication? DENOSUMAB (den oh sue mab) slows bone breakdown. Prolia is used to treat osteoporosis in women after menopause and in men, and in people who are taking corticosteroids for 6 months or more. Delton See is used to treat a high calcium level due to cancer and to prevent bone fractures and other bone problems caused by multiple myeloma or cancer bone metastases. Delton See is also used totreat giant cell tumor of the bone. This medicine may be used for other purposes; ask your health care provider orpharmacist if you have questions. COMMON BRAND NAME(S): Prolia, XGEVA What should I tell my care team before I take this medication? They need to know if you have any of these conditions: dental disease having surgery or tooth extraction infection kidney disease low levels of calcium or Vitamin D in the blood malnutrition on hemodialysis skin conditions or sensitivity thyroid or parathyroid disease an unusual reaction to denosumab, other medicines, foods, dyes, or preservatives pregnant or trying to get pregnant breast-feeding How should I use this medication? This medicine is for injection under the skin. It is given by a health careprofessional in a hospital or clinic setting. A special MedGuide will be given to you before each treatment. Be sure to readthis information carefully each time. For Prolia, talk to your pediatrician regarding the use of this medicine in children. Special care may be needed. For Delton See, talk to your pediatrician regarding the use of this medicine in children. While this drug may be prescribed for children as young as 13 years for selected conditions,precautions do apply. Overdosage: If you think you have taken too much of this medicine contact apoison control center or emergency room at once. NOTE: This medicine is only for you. Do not share this medicine with others. What if I miss a dose? It is important not to miss your dose. Call  your doctor or health careprofessional if you are unable to keep an appointment. What may interact with this medication? Do not take this medicine with any of the following medications: other medicines containing denosumab This medicine may also interact with the following medications: medicines that lower your chance of fighting infection steroid medicines like prednisone or cortisone This list may not describe all possible interactions. Give your health care provider a list of all the medicines, herbs, non-prescription drugs, or dietary supplements you use. Also tell them if you smoke, drink alcohol, or use illegaldrugs. Some items may interact with your medicine. What should I watch for while using this medication? Visit your doctor or health care professional for regular checks on your progress. Your doctor or health care professional may order blood tests andother tests to see how you are doing. Call your doctor or health care professional for advice if you get a fever, chills or sore throat, or other symptoms of a cold or flu. Do not treat yourself. This drug may decrease your body's ability to fight infection. Try toavoid being around people who are sick. You should make sure you get enough calcium and vitamin D while you are taking this medicine, unless your doctor tells you not to. Discuss the foods you eatand the vitamins you take with your health care professional. See your dentist regularly. Brush and floss your teeth as directed. Before youhave any dental work done, tell your dentist you are receiving this medicine. Do not become pregnant while taking this medicine or for 5 months after stopping it. Talk with your doctor or health care professional about your  birth control options while taking this medicine. Women should inform their doctor if they wish to become pregnant or think they might be pregnant. There is a potential for serious side effects to an unborn child. Talk to your health  careprofessional or pharmacist for more information. What side effects may I notice from receiving this medication? Side effects that you should report to your doctor or health care professionalas soon as possible: allergic reactions like skin rash, itching or hives, swelling of the face, lips, or tongue bone pain breathing problems dizziness jaw pain, especially after dental work redness, blistering, peeling of the skin signs and symptoms of infection like fever or chills; cough; sore throat; pain or trouble passing urine signs of low calcium like fast heartbeat, muscle cramps or muscle pain; pain, tingling, numbness in the hands or feet; seizures unusual bleeding or bruising unusually weak or tired Side effects that usually do not require medical attention (report to yourdoctor or health care professional if they continue or are bothersome): constipation diarrhea headache joint pain loss of appetite muscle pain runny nose tiredness upset stomach This list may not describe all possible side effects. Call your doctor for medical advice about side effects. You may report side effects to FDA at1-800-FDA-1088. Where should I keep my medication? This medicine is only given in a clinic, doctor's office, or other health caresetting and will not be stored at home. NOTE: This sheet is a summary. It may not cover all possible information. If you have questions about this medicine, talk to your doctor, pharmacist, orhealth care provider.  2022 Elsevier/Gold Standard (2017-11-21 16:10:44) Fulvestrant injection What is this medication? FULVESTRANT (ful VES trant) blocks the effects of estrogen. It is used to treatbreast cancer. This medicine may be used for other purposes; ask your health care provider orpharmacist if you have questions. COMMON BRAND NAME(S): FASLODEX What should I tell my care team before I take this medication? They need to know if you have any of these conditions: bleeding  disorders liver disease low blood counts, like low white cell, platelet, or red cell counts an unusual or allergic reaction to fulvestrant, other medicines, foods, dyes, or preservatives pregnant or trying to get pregnant breast-feeding How should I use this medication? This medicine is for injection into a muscle. It is usually given by a healthcare professional in a hospital or clinic setting. Talk to your pediatrician regarding the use of this medicine in children.Special care may be needed. Overdosage: If you think you have taken too much of this medicine contact apoison control center or emergency room at once. NOTE: This medicine is only for you. Do not share this medicine with others. What if I miss a dose? It is important not to miss your dose. Call your doctor or health careprofessional if you are unable to keep an appointment. What may interact with this medication? medicines that treat or prevent blood clots like warfarin, enoxaparin, dalteparin, apixaban, dabigatran, and rivaroxaban This list may not describe all possible interactions. Give your health care provider a list of all the medicines, herbs, non-prescription drugs, or dietary supplements you use. Also tell them if you smoke, drink alcohol, or use illegaldrugs. Some items may interact with your medicine. What should I watch for while using this medication? Your condition will be monitored carefully while you are receiving this medicine. You will need important blood work done while you are taking thismedicine. Do not become pregnant while taking this medicine or for at least 1 year after stopping   it. Women of child-bearing potential will need to have a negative pregnancy test before starting this medicine. Women should inform their doctor if they wish to become pregnant or think they might be pregnant. There is a potential for serious side effects to an unborn child. Men should inform their doctors if they wish to father a  child. This medicine may lower sperm counts. Talk to your health care professional or pharmacist for more information. Do not breast-feed an infant while taking this medicine or for 1 year after thelast dose. What side effects may I notice from receiving this medication? Side effects that you should report to your doctor or health care professionalas soon as possible: allergic reactions like skin rash, itching or hives, swelling of the face, lips, or tongue feeling faint or lightheaded, falls pain, tingling, numbness, or weakness in the legs signs and symptoms of infection like fever or chills; cough; flu-like symptoms; sore throat vaginal bleeding Side effects that usually do not require medical attention (report to yourdoctor or health care professional if they continue or are bothersome): aches, pains constipation diarrhea headache hot flashes nausea, vomiting pain at site where injected stomach pain This list may not describe all possible side effects. Call your doctor for medical advice about side effects. You may report side effects to FDA at1-800-FDA-1088. Where should I keep my medication? This drug is given in a hospital or clinic and will not be stored at home. NOTE: This sheet is a summary. It may not cover all possible information. If you have questions about this medicine, talk to your doctor, pharmacist, orhealth care provider.  2022 Elsevier/Gold Standard (2017-10-23 11:34:41)  

## 2021-02-08 ENCOUNTER — Other Ambulatory Visit: Payer: Self-pay | Admitting: Oncology

## 2021-02-08 DIAGNOSIS — C50411 Malignant neoplasm of upper-outer quadrant of right female breast: Secondary | ICD-10-CM

## 2021-02-08 DIAGNOSIS — Z170 Estrogen receptor positive status [ER+]: Secondary | ICD-10-CM

## 2021-02-08 NOTE — Progress Notes (Signed)
I called Barbara Parks with the results of her hip films.  I think she will benefit from radiation to the right hip area where she is having significant pain.  She has never had radiation before.  I am going to go ahead and and set her up for this through our radiation doctors.  She is agreeable

## 2021-02-09 ENCOUNTER — Telehealth: Payer: Self-pay | Admitting: Oncology

## 2021-02-09 ENCOUNTER — Telehealth: Payer: Self-pay | Admitting: Radiation Oncology

## 2021-02-09 NOTE — Telephone Encounter (Signed)
Called patient to schedule consultation with Dr. Lisbeth Renshaw. No answer, LVM.

## 2021-02-09 NOTE — Telephone Encounter (Signed)
Scheduled per 7/12 los. Called and spoke with pt confirmed 8/9 appts

## 2021-02-13 ENCOUNTER — Telehealth: Payer: Self-pay | Admitting: *Deleted

## 2021-02-13 ENCOUNTER — Other Ambulatory Visit: Payer: Self-pay | Admitting: *Deleted

## 2021-02-13 NOTE — Telephone Encounter (Signed)
This RN spoke with pt on 7/15 per her call inquiring about getting recommended radiation closer to her home in [place].  Per discussion [facility] in [place] is closer and pt requested referral to their facility.  This RN will send referral to above at [phone].

## 2021-02-13 NOTE — Progress Notes (Signed)
Received confirmation of referral to [facility] radiation facility.

## 2021-02-14 IMAGING — MR MR ABDOMEN WO/W CM
18 series · 48 of 48 positions shown · IV contrast (gadavist)
Comparison: CT 07/09/2019

CLINICAL DATA: Indeterminate liver lesion on CT. MRI recommended
for further characterization. Breast cancer patient.

EXAM:
MRI ABDOMEN WITHOUT AND WITH CONTRAST
TECHNIQUE: Multiplanar multisequence MR imaging of the abdomen was performed
both before and after the administration of intravenous contrast.
CONTRAST:  7mL GADAVIST GADOBUTROL 1 MMOL/ML IV SOLN

[Series 4: cor haste · coronal · 5.0mm · 1.19mm/px · 2 of 30 slices shown]
[im 1/30]
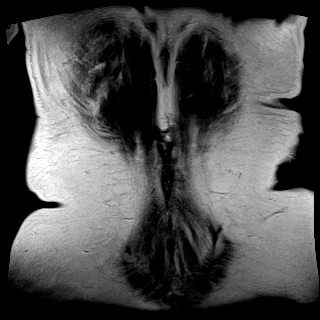
[im 30/30]
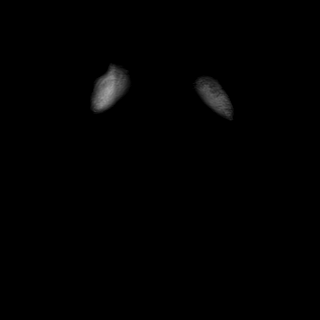

[Series 5: T2 fat-sat · axial · 6.0mm · 1.06mm/px · z∈[-247,+5]mm · 2 of 36 slices shown]
[im 1/36]
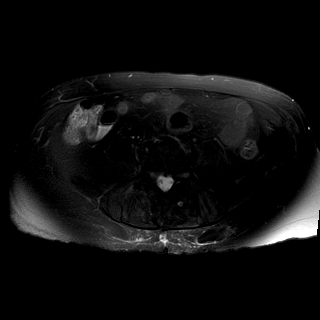
[im 36/36]
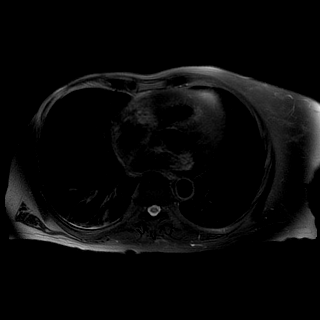

[Series 6: ax haste · axial · 6.0mm · 1.06mm/px · z∈[-247,+5]mm · 2 of 36 slices shown]
[im 1/36]
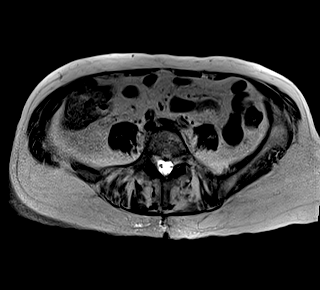
[im 36/36]
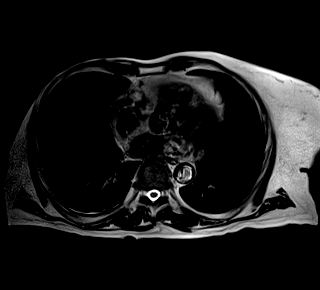

[Series 8: DWI · axial · 6.0mm · 1.42mm/px · z∈[-247,+5]mm · 4 of 108 slices shown (1 of 2)]
[im 1/108]
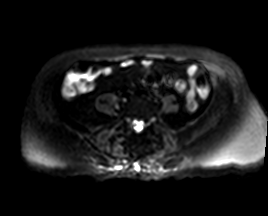
[im 36/108]
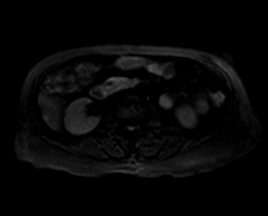
[im 72/108]
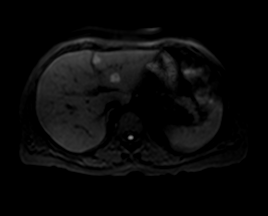
[im 108/108]
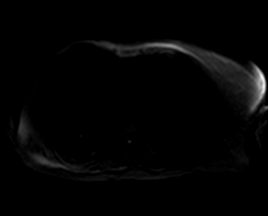

[Series 9: DWI · axial · 6.0mm · 1.42mm/px · 1 of 36 slices shown (2 of 2)]
[im 1/36]
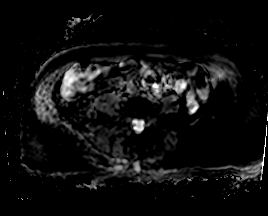

[Series 10: bSSFP · axial · 6.0mm · 0.74mm/px · 1 of 36 slices shown]
[im 1/36]
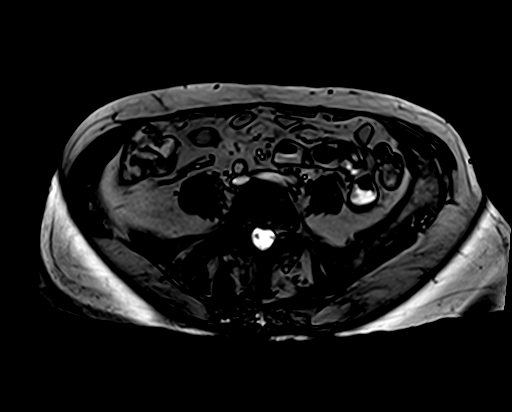

[Series 11: t1_vibe_fs_tra_p4_bh_pre · axial · 3.0mm · 1.06mm/px · z∈[-263,-2]mm · 3 of 88 slices shown]
[im 1/88]
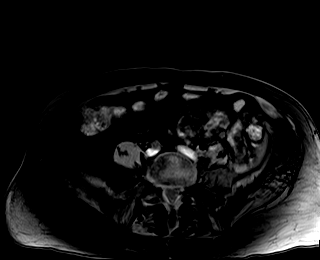
[im 44/88]
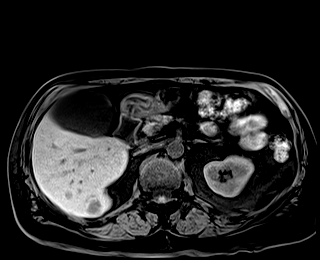
[im 88/88]
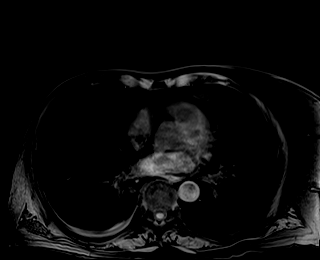

[Series 13: t1_vibe_fs_tra_p4_bh_post · axial · 3.0mm · 1.06mm/px · z∈[-263,-2]mm · 3 of 88 slices shown (1 of 4)]
[im 1/88]
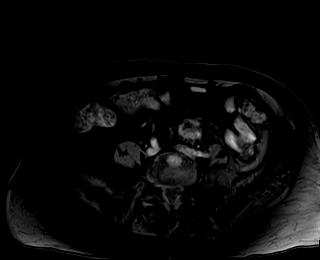
[im 44/88]
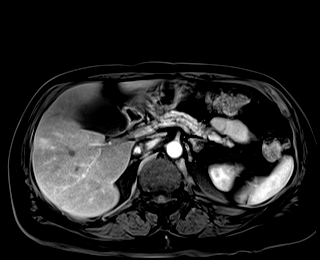
[im 88/88]
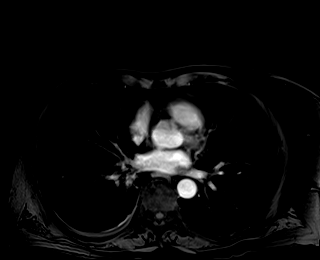

[Series 14: t1_vibe_fs_tra_p4_bh_post_sub · axial · 3.0mm · 1.06mm/px · z∈[-263,-2]mm · 3 of 88 slices shown (1 of 4)]
[im 1/88]
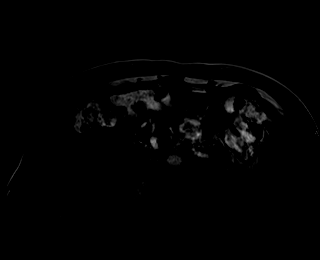
[im 44/88]
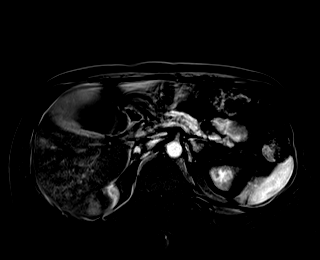
[im 88/88]
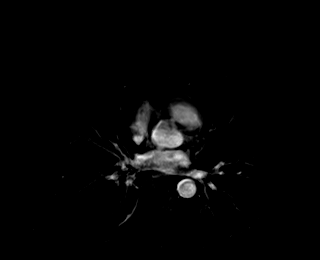

[Series 15: t1_vibe_fs_tra_p4_bh_post · axial · 3.0mm · 1.06mm/px · z∈[-263,-2]mm · 3 of 88 slices shown (2 of 4)]
[im 1/88]
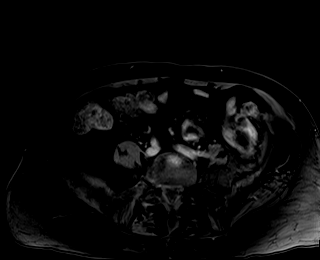
[im 44/88]
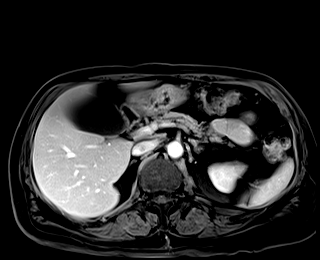
[im 88/88]
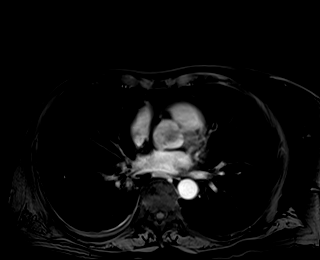

[Series 16: t1_vibe_fs_tra_p4_bh_post_sub · axial · 3.0mm · 1.06mm/px · z∈[-263,-2]mm · 3 of 88 slices shown (2 of 4)]
[im 1/88]
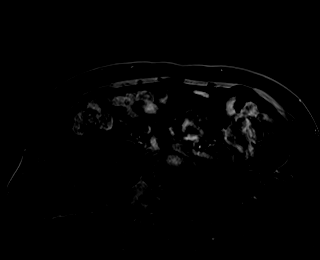
[im 44/88]
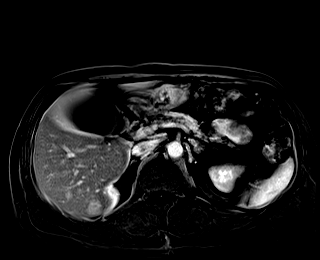
[im 88/88]
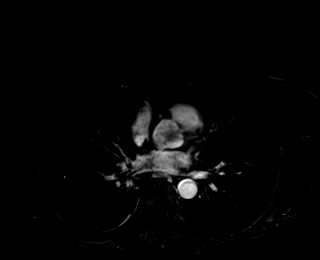

[Series 17: t1_vibe_fs_tra_p4_bh_post · axial · 3.0mm · 1.06mm/px · z∈[-263,-2]mm · 3 of 88 slices shown (3 of 4)]
[im 1/88]
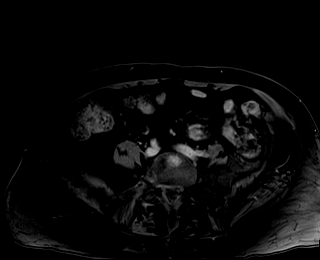
[im 44/88]
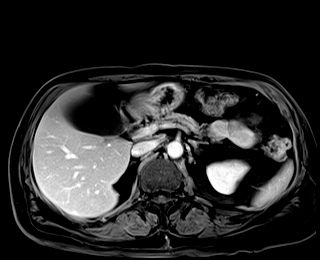
[im 88/88]
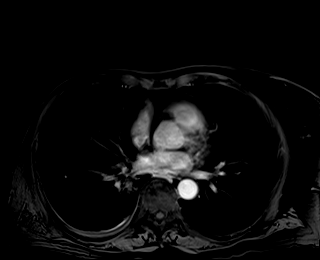

[Series 18: t1_vibe_fs_tra_p4_bh_post_sub · axial · 3.0mm · 1.06mm/px · z∈[-263,-2]mm · 3 of 88 slices shown (3 of 4)]
[im 1/88]
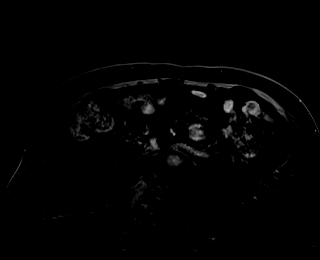
[im 44/88]
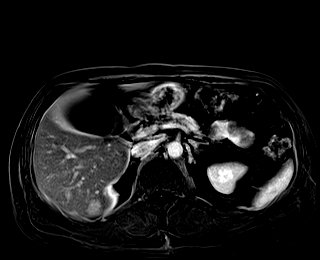
[im 88/88]
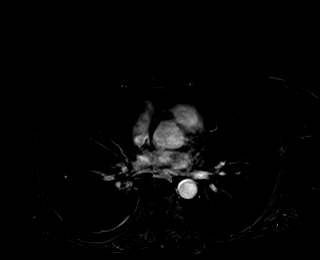

[Series 19: t1_vibe_fs_tra_p4_bh_post · axial · 3.0mm · 1.06mm/px · z∈[-263,-2]mm · 3 of 88 slices shown (4 of 4)]
[im 1/88]
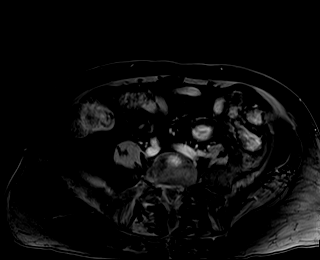
[im 44/88]
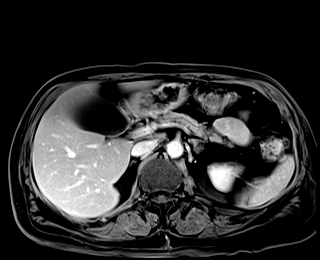
[im 88/88]
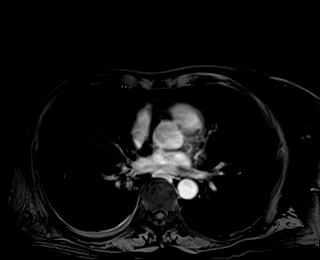

[Series 20: t1_vibe_fs_tra_p4_bh_post_sub · axial · 3.0mm · 1.06mm/px · z∈[-263,-2]mm · 3 of 88 slices shown (4 of 4)]
[im 1/88]
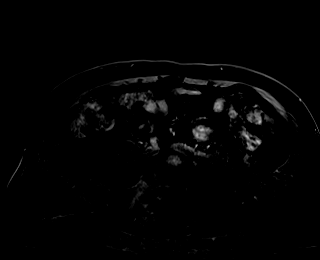
[im 44/88]
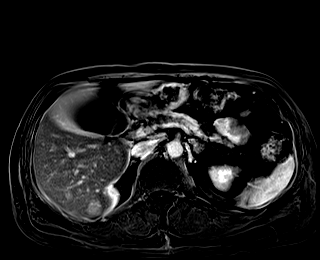
[im 88/88]
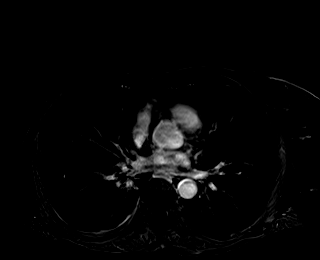

[Series 21: T1 dynamic post-contrast · coronal · 3.0mm · 1.25mm/px · 3 of 72 slices shown]
[im 1/72]
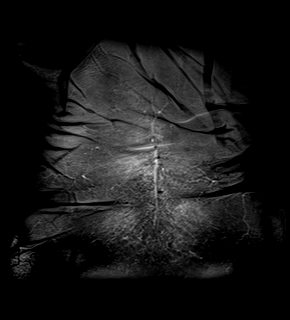
[im 36/72]
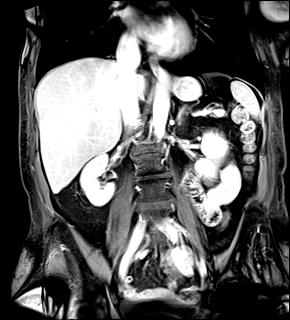
[im 72/72]
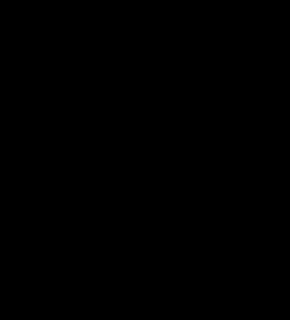

[Series 1024: out of phase · axial · 3.0mm · 1.06mm/px · z∈[-263,-2]mm · 3 of 88 slices shown]
[im 1/88]
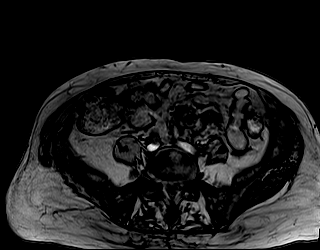
[im 44/88]
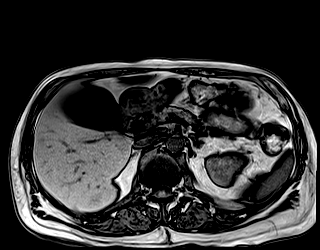
[im 88/88]
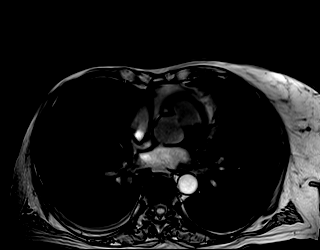

[Series 1031: in phase · axial · 3.0mm · 1.06mm/px · z∈[-263,-2]mm · 3 of 88 slices shown]
[im 1/88]
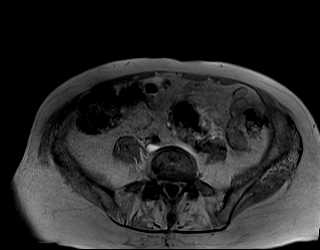
[im 44/88]
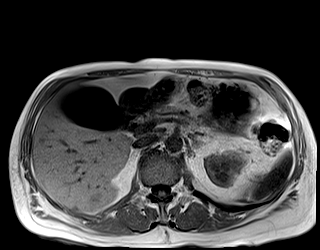
[im 88/88]
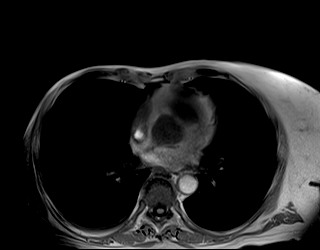

[48 of 48 positions shown; findings below may reference images not displayed]

FINDINGS: Lower chest:  Lung bases are clear.

Hepatobiliary: Several round lesions within the LEFT and RIGHT
hepatic lobe are hyperintense on T2 weighted imaging (series 5).
Example lesion measures 1.8 cm on image 17. Example lesion in the
LEFT hepatic lobe along the falciform ligament measures 1.1 cm
(image 13). Approximately 8 lesions in liver. These lesions
demonstrate peripheral arterial enhancement (image 52/13 for
example)).

Pancreas: Normal pancreatic parenchymal intensity. No ductal
dilatation or inflammation.

Spleen: Normal spleen.

Adrenals/urinary tract: Adrenal glands and kidneys are normal.

Stomach/Bowel: Stomach and limited of the small bowel is
unremarkable

Vascular/Lymphatic: Abdominal aortic normal caliber. No
retroperitoneal periportal lymphadenopathy.

Musculoskeletal: Insert enhancing lesions within the lumbar spine.
For example 2.3 cm lesion in the L3 vertebral body (image 60/13).
Rounded 1.7 cm lesion in the L5
IMPRESSION: 1. Multiple round enhancing lesions in liver consistent with HEPATIC
METASTASIS. Approximately 8 discrete lesions.
2. Skeletal metastasis in the lumbar vertebral bodies.

## 2021-02-15 DIAGNOSIS — Z87891 Personal history of nicotine dependence: Secondary | ICD-10-CM | POA: Diagnosis not present

## 2021-02-15 DIAGNOSIS — Z9011 Acquired absence of right breast and nipple: Secondary | ICD-10-CM | POA: Diagnosis not present

## 2021-02-15 DIAGNOSIS — C50911 Malignant neoplasm of unspecified site of right female breast: Secondary | ICD-10-CM | POA: Diagnosis not present

## 2021-02-15 DIAGNOSIS — C7951 Secondary malignant neoplasm of bone: Secondary | ICD-10-CM | POA: Diagnosis not present

## 2021-02-15 DIAGNOSIS — Z170 Estrogen receptor positive status [ER+]: Secondary | ICD-10-CM | POA: Diagnosis not present

## 2021-02-15 DIAGNOSIS — Z803 Family history of malignant neoplasm of breast: Secondary | ICD-10-CM | POA: Diagnosis not present

## 2021-02-15 DIAGNOSIS — Z79818 Long term (current) use of other agents affecting estrogen receptors and estrogen levels: Secondary | ICD-10-CM | POA: Diagnosis not present

## 2021-02-15 DIAGNOSIS — E7800 Pure hypercholesterolemia, unspecified: Secondary | ICD-10-CM | POA: Diagnosis not present

## 2021-02-15 DIAGNOSIS — E119 Type 2 diabetes mellitus without complications: Secondary | ICD-10-CM | POA: Diagnosis not present

## 2021-02-21 ENCOUNTER — Ambulatory Visit (HOSPITAL_COMMUNITY)
Admission: RE | Admit: 2021-02-21 | Discharge: 2021-02-21 | Disposition: A | Discharge status: Home / Self Care | Payer: Medicare Other | Source: Ambulatory Visit | Attending: Oncology | Admitting: Oncology

## 2021-02-21 ENCOUNTER — Encounter (HOSPITAL_COMMUNITY)
Admission: RE | Admit: 2021-02-21 | Discharge: 2021-02-21 | Disposition: A | Discharge status: Home / Self Care | Payer: Medicare Other | Source: Ambulatory Visit | Attending: Oncology | Admitting: Oncology

## 2021-02-21 ENCOUNTER — Other Ambulatory Visit: Payer: Self-pay

## 2021-02-21 DIAGNOSIS — C801 Malignant (primary) neoplasm, unspecified: Secondary | ICD-10-CM | POA: Diagnosis not present

## 2021-02-21 DIAGNOSIS — C50919 Malignant neoplasm of unspecified site of unspecified female breast: Secondary | ICD-10-CM | POA: Diagnosis not present

## 2021-02-21 DIAGNOSIS — C50911 Malignant neoplasm of unspecified site of right female breast: Secondary | ICD-10-CM

## 2021-02-21 DIAGNOSIS — K839 Disease of biliary tract, unspecified: Secondary | ICD-10-CM | POA: Diagnosis not present

## 2021-02-21 DIAGNOSIS — Z170 Estrogen receptor positive status [ER+]: Secondary | ICD-10-CM | POA: Diagnosis not present

## 2021-02-21 DIAGNOSIS — C7800 Secondary malignant neoplasm of unspecified lung: Secondary | ICD-10-CM | POA: Insufficient documentation

## 2021-02-21 DIAGNOSIS — C50411 Malignant neoplasm of upper-outer quadrant of right female breast: Secondary | ICD-10-CM | POA: Diagnosis not present

## 2021-02-21 DIAGNOSIS — I3131 Malignant pericardial effusion in diseases classified elsewhere: Secondary | ICD-10-CM

## 2021-02-21 DIAGNOSIS — D3500 Benign neoplasm of unspecified adrenal gland: Secondary | ICD-10-CM | POA: Diagnosis not present

## 2021-02-21 DIAGNOSIS — C7951 Secondary malignant neoplasm of bone: Secondary | ICD-10-CM | POA: Diagnosis not present

## 2021-02-21 DIAGNOSIS — I313 Pericardial effusion (noninflammatory): Secondary | ICD-10-CM

## 2021-02-21 DIAGNOSIS — C787 Secondary malignant neoplasm of liver and intrahepatic bile duct: Secondary | ICD-10-CM | POA: Diagnosis not present

## 2021-02-21 DIAGNOSIS — K769 Liver disease, unspecified: Secondary | ICD-10-CM | POA: Diagnosis not present

## 2021-02-21 MED ORDER — TECHNETIUM TC 99M MEDRONATE IV KIT
21.2000 | PACK | Freq: Once | INTRAVENOUS | Status: AC
  Administered 2021-02-21: 21.2 via INTRAVENOUS

## 2021-02-21 MED ORDER — GADOBUTROL 1 MMOL/ML IV SOLN
7.0000 mL | Freq: Once | INTRAVENOUS | Status: AC | PRN
  Administered 2021-02-21: 7 mL via INTRAVENOUS

## 2021-02-22 ENCOUNTER — Encounter: Payer: Self-pay | Admitting: Oncology

## 2021-02-23 DIAGNOSIS — E119 Type 2 diabetes mellitus without complications: Secondary | ICD-10-CM | POA: Diagnosis not present

## 2021-02-23 DIAGNOSIS — Z79818 Long term (current) use of other agents affecting estrogen receptors and estrogen levels: Secondary | ICD-10-CM | POA: Diagnosis not present

## 2021-02-23 DIAGNOSIS — C7951 Secondary malignant neoplasm of bone: Secondary | ICD-10-CM | POA: Diagnosis not present

## 2021-02-23 DIAGNOSIS — C50911 Malignant neoplasm of unspecified site of right female breast: Secondary | ICD-10-CM | POA: Diagnosis not present

## 2021-02-23 DIAGNOSIS — Z170 Estrogen receptor positive status [ER+]: Secondary | ICD-10-CM | POA: Diagnosis not present

## 2021-02-23 DIAGNOSIS — E7800 Pure hypercholesterolemia, unspecified: Secondary | ICD-10-CM | POA: Diagnosis not present

## 2021-02-27 DIAGNOSIS — Z9011 Acquired absence of right breast and nipple: Secondary | ICD-10-CM | POA: Diagnosis not present

## 2021-02-27 DIAGNOSIS — Z79818 Long term (current) use of other agents affecting estrogen receptors and estrogen levels: Secondary | ICD-10-CM | POA: Diagnosis not present

## 2021-02-27 DIAGNOSIS — Z87891 Personal history of nicotine dependence: Secondary | ICD-10-CM | POA: Diagnosis not present

## 2021-02-27 DIAGNOSIS — E7800 Pure hypercholesterolemia, unspecified: Secondary | ICD-10-CM | POA: Diagnosis not present

## 2021-02-27 DIAGNOSIS — Z803 Family history of malignant neoplasm of breast: Secondary | ICD-10-CM | POA: Diagnosis not present

## 2021-02-27 DIAGNOSIS — C7951 Secondary malignant neoplasm of bone: Secondary | ICD-10-CM | POA: Diagnosis not present

## 2021-02-27 DIAGNOSIS — Z170 Estrogen receptor positive status [ER+]: Secondary | ICD-10-CM | POA: Diagnosis not present

## 2021-02-27 DIAGNOSIS — C50911 Malignant neoplasm of unspecified site of right female breast: Secondary | ICD-10-CM | POA: Diagnosis not present

## 2021-02-27 DIAGNOSIS — E119 Type 2 diabetes mellitus without complications: Secondary | ICD-10-CM | POA: Diagnosis not present

## 2021-02-27 IMAGING — US US BIOPSY CORE LIVER
1 series · 12 of 12 positions shown · non-contrast
Comparison: none

INDICATION: 68-year-old with history of breast cancer and liver lesions. Request
for tissue sampling.

[Series 1: us biopsy core liver · 12 of 12 slices shown]
[im 1/12]
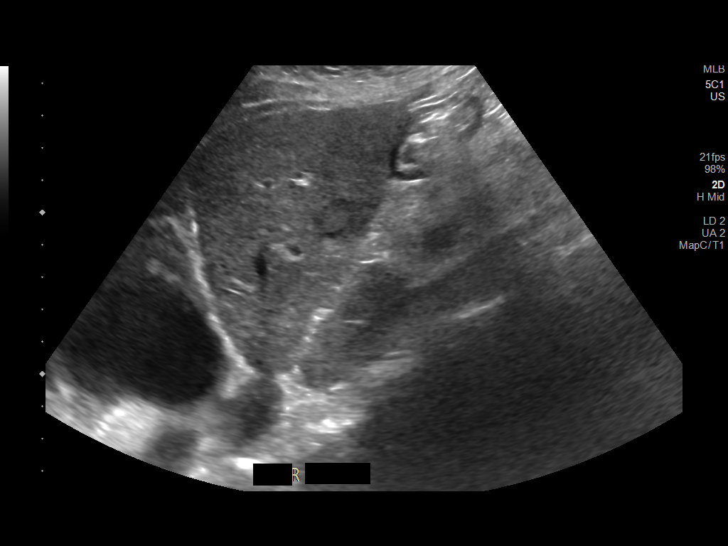
[im 2/12]
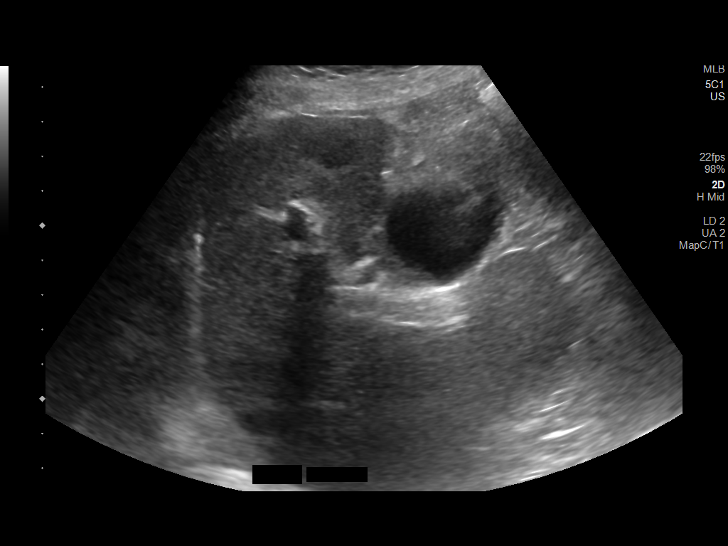
[im 3/12]
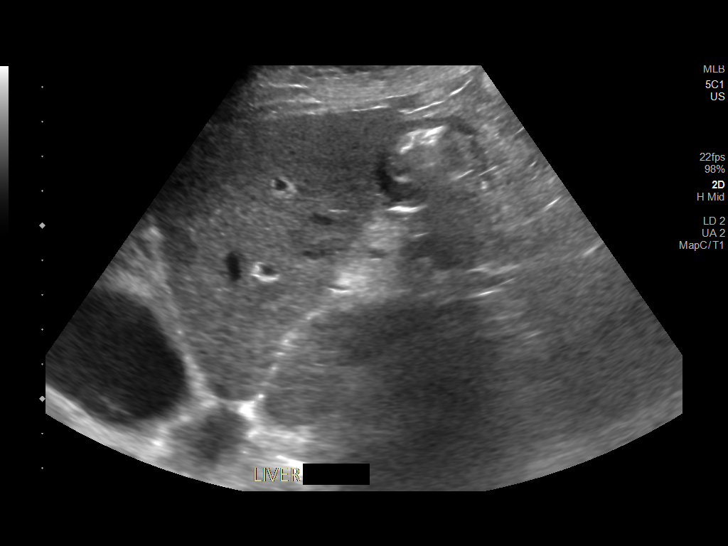
[im 4/12]
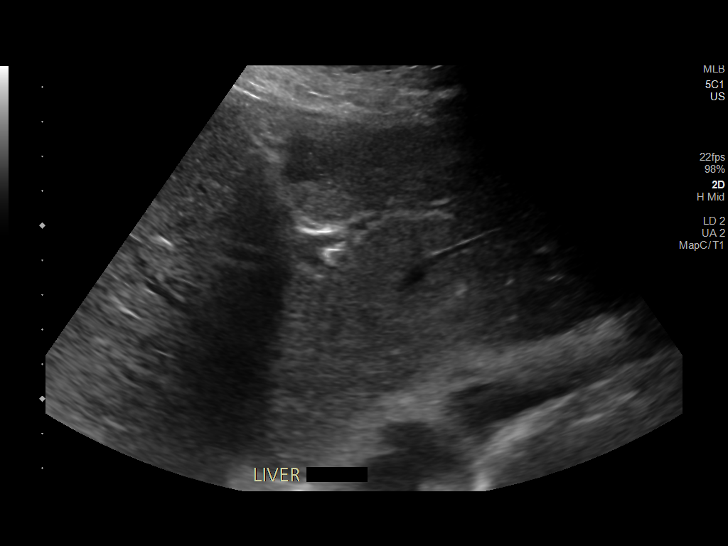
[im 5/12]
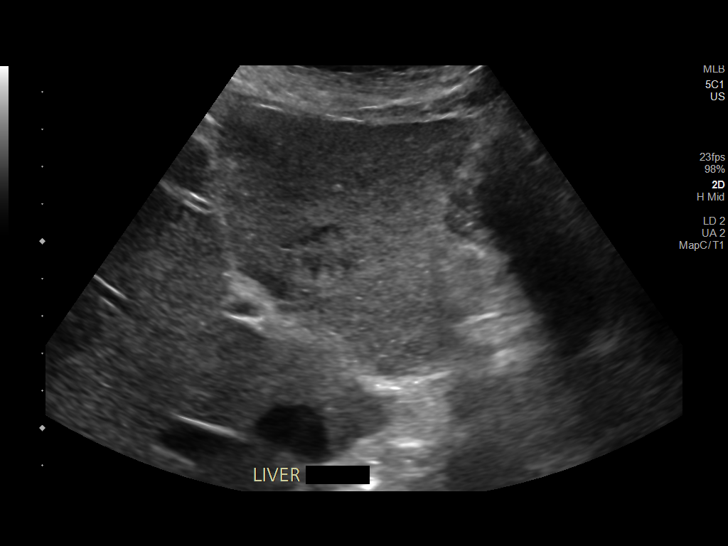
[im 6/12]
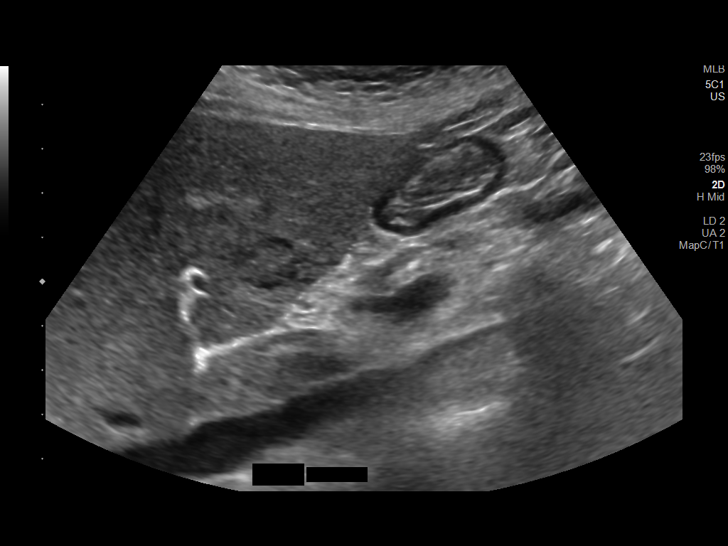
[im 7/12]
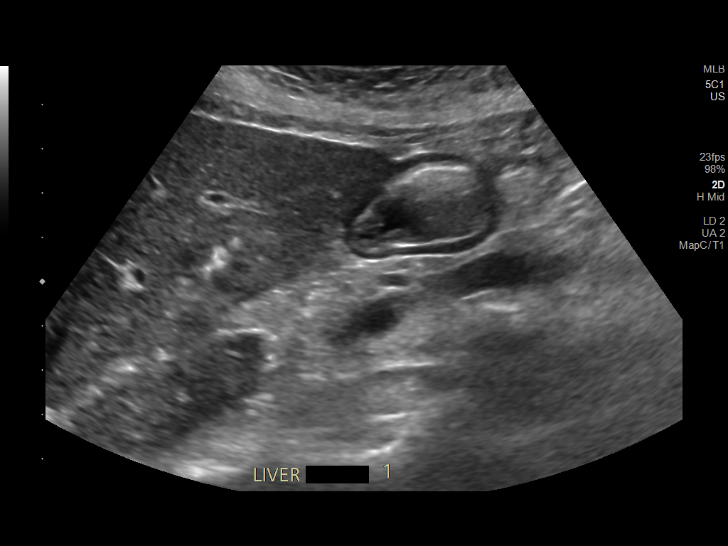
[im 8/12]
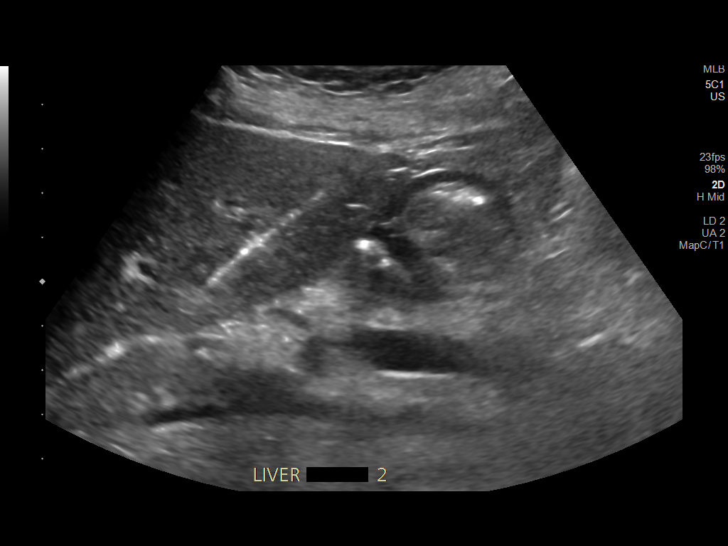
[im 9/12]
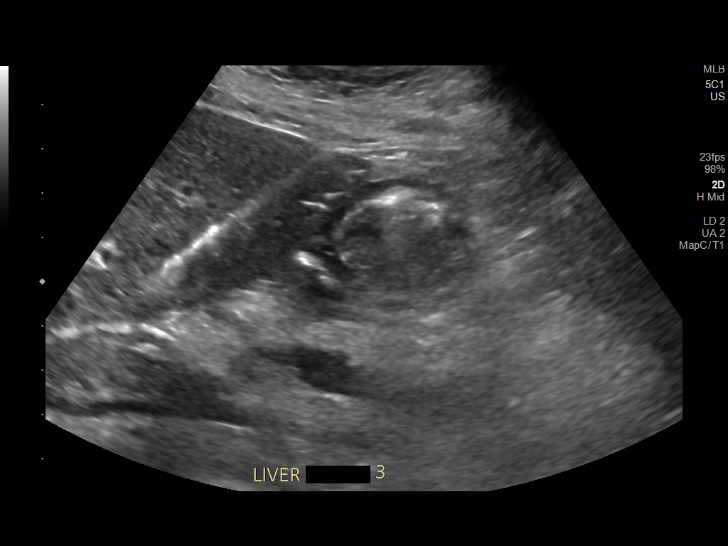
[im 10/12]
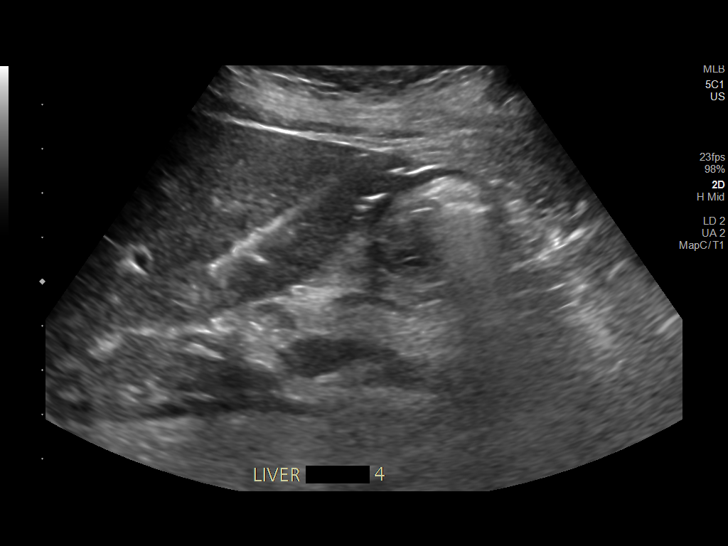
[im 11/12]
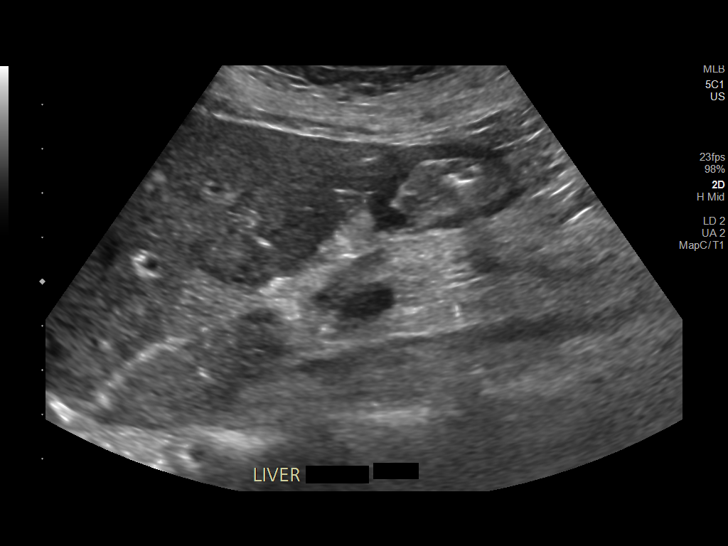
[im 12/12]
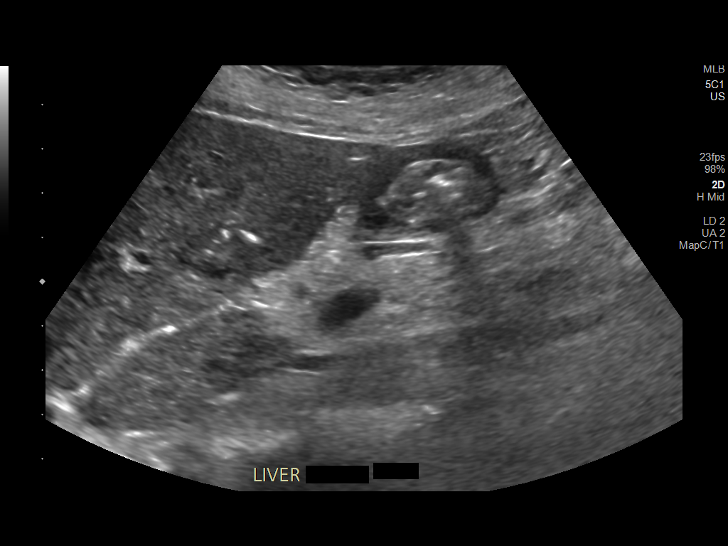

[12 of 12 positions shown; findings below may reference images not displayed]

EXAM:
ULTRASOUND-GUIDED LIVER LESION BIOPSY

MEDICATIONS:
None.

ANESTHESIA/SEDATION:
Moderate (conscious) sedation was employed during this procedure. A
total of Versed 2.0 mg and Fentanyl 100 mcg was administered
intravenously.

Moderate Sedation Time: 21 minutes. The patient's level of
consciousness and vital signs were monitored continuously by
radiology nursing throughout the procedure under my direct
supervision.

FLUOROSCOPY TIME:  None

COMPLICATIONS:
None immediate.

PROCEDURE:
Informed written consent was obtained from the patient after a
thorough discussion of the procedural risks, benefits and
alternatives. All questions were addressed. Maximal Sterile Barrier
Technique was utilized including caps, mask, sterile gowns, sterile
gloves, sterile drape, hand hygiene and skin antiseptic. A timeout
was performed prior to the initiation of the procedure.

Liver was evaluated with ultrasound. Lesion in the left hepatic lobe
was targeted. The anterior abdomen was prepped with chlorhexidine
and sterile field was created. Skin and soft tissues were
anesthetized with 1% lidocaine. Using ultrasound guidance, 17 gauge
coaxial needle was directed into the left hepatic lesion. Total of 4
core biopsies were obtained with an 18 gauge core device. Specimens
placed in formalin. Needle was removed without complication. Bandage
placed over the puncture site.
FINDINGS: Two lesions identified in the medial left hepatic lobe. Core
biopsies were obtained from the deeper lesion. No significant
bleeding or hematoma formation following core biopsies.
IMPRESSION: Ultrasound-guided core biopsy of a left hepatic lesion.

## 2021-02-28 DIAGNOSIS — Z79818 Long term (current) use of other agents affecting estrogen receptors and estrogen levels: Secondary | ICD-10-CM | POA: Diagnosis not present

## 2021-02-28 DIAGNOSIS — C7951 Secondary malignant neoplasm of bone: Secondary | ICD-10-CM | POA: Diagnosis not present

## 2021-02-28 DIAGNOSIS — C50911 Malignant neoplasm of unspecified site of right female breast: Secondary | ICD-10-CM | POA: Diagnosis not present

## 2021-02-28 DIAGNOSIS — E7800 Pure hypercholesterolemia, unspecified: Secondary | ICD-10-CM | POA: Diagnosis not present

## 2021-02-28 DIAGNOSIS — E119 Type 2 diabetes mellitus without complications: Secondary | ICD-10-CM | POA: Diagnosis not present

## 2021-02-28 DIAGNOSIS — Z170 Estrogen receptor positive status [ER+]: Secondary | ICD-10-CM | POA: Diagnosis not present

## 2021-03-01 DIAGNOSIS — E119 Type 2 diabetes mellitus without complications: Secondary | ICD-10-CM | POA: Diagnosis not present

## 2021-03-01 DIAGNOSIS — Z79818 Long term (current) use of other agents affecting estrogen receptors and estrogen levels: Secondary | ICD-10-CM | POA: Diagnosis not present

## 2021-03-01 DIAGNOSIS — C7951 Secondary malignant neoplasm of bone: Secondary | ICD-10-CM | POA: Diagnosis not present

## 2021-03-01 DIAGNOSIS — E7800 Pure hypercholesterolemia, unspecified: Secondary | ICD-10-CM | POA: Diagnosis not present

## 2021-03-01 DIAGNOSIS — Z170 Estrogen receptor positive status [ER+]: Secondary | ICD-10-CM | POA: Diagnosis not present

## 2021-03-01 DIAGNOSIS — C50911 Malignant neoplasm of unspecified site of right female breast: Secondary | ICD-10-CM | POA: Diagnosis not present

## 2021-03-02 DIAGNOSIS — E7800 Pure hypercholesterolemia, unspecified: Secondary | ICD-10-CM | POA: Diagnosis not present

## 2021-03-02 DIAGNOSIS — Z79818 Long term (current) use of other agents affecting estrogen receptors and estrogen levels: Secondary | ICD-10-CM | POA: Diagnosis not present

## 2021-03-02 DIAGNOSIS — Z170 Estrogen receptor positive status [ER+]: Secondary | ICD-10-CM | POA: Diagnosis not present

## 2021-03-02 DIAGNOSIS — C50911 Malignant neoplasm of unspecified site of right female breast: Secondary | ICD-10-CM | POA: Diagnosis not present

## 2021-03-02 DIAGNOSIS — E119 Type 2 diabetes mellitus without complications: Secondary | ICD-10-CM | POA: Diagnosis not present

## 2021-03-02 DIAGNOSIS — C7951 Secondary malignant neoplasm of bone: Secondary | ICD-10-CM | POA: Diagnosis not present

## 2021-03-05 DIAGNOSIS — C7951 Secondary malignant neoplasm of bone: Secondary | ICD-10-CM | POA: Diagnosis not present

## 2021-03-05 DIAGNOSIS — Z79818 Long term (current) use of other agents affecting estrogen receptors and estrogen levels: Secondary | ICD-10-CM | POA: Diagnosis not present

## 2021-03-05 DIAGNOSIS — E119 Type 2 diabetes mellitus without complications: Secondary | ICD-10-CM | POA: Diagnosis not present

## 2021-03-05 DIAGNOSIS — C50911 Malignant neoplasm of unspecified site of right female breast: Secondary | ICD-10-CM | POA: Diagnosis not present

## 2021-03-05 DIAGNOSIS — Z170 Estrogen receptor positive status [ER+]: Secondary | ICD-10-CM | POA: Diagnosis not present

## 2021-03-05 DIAGNOSIS — E7800 Pure hypercholesterolemia, unspecified: Secondary | ICD-10-CM | POA: Diagnosis not present

## 2021-03-06 ENCOUNTER — Inpatient Hospital Stay: Payer: Medicare Other

## 2021-03-06 ENCOUNTER — Other Ambulatory Visit: Payer: Medicare Other

## 2021-03-06 ENCOUNTER — Other Ambulatory Visit: Payer: Self-pay

## 2021-03-06 ENCOUNTER — Inpatient Hospital Stay: Payer: Medicare Other | Attending: Oncology

## 2021-03-06 ENCOUNTER — Inpatient Hospital Stay (HOSPITAL_BASED_OUTPATIENT_CLINIC_OR_DEPARTMENT_OTHER): Payer: Medicare Other | Admitting: Oncology

## 2021-03-06 VITALS — BP 117/62 | HR 116 | Temp 97.5°F | Resp 18 | Ht 64.5 in | Wt 151.0 lb

## 2021-03-06 DIAGNOSIS — C801 Malignant (primary) neoplasm, unspecified: Secondary | ICD-10-CM

## 2021-03-06 DIAGNOSIS — C7800 Secondary malignant neoplasm of unspecified lung: Secondary | ICD-10-CM | POA: Diagnosis not present

## 2021-03-06 DIAGNOSIS — C50411 Malignant neoplasm of upper-outer quadrant of right female breast: Secondary | ICD-10-CM | POA: Diagnosis not present

## 2021-03-06 DIAGNOSIS — C7951 Secondary malignant neoplasm of bone: Secondary | ICD-10-CM

## 2021-03-06 DIAGNOSIS — E119 Type 2 diabetes mellitus without complications: Secondary | ICD-10-CM | POA: Diagnosis not present

## 2021-03-06 DIAGNOSIS — Z5111 Encounter for antineoplastic chemotherapy: Secondary | ICD-10-CM | POA: Insufficient documentation

## 2021-03-06 DIAGNOSIS — Z170 Estrogen receptor positive status [ER+]: Secondary | ICD-10-CM | POA: Diagnosis not present

## 2021-03-06 DIAGNOSIS — Z79899 Other long term (current) drug therapy: Secondary | ICD-10-CM | POA: Diagnosis not present

## 2021-03-06 DIAGNOSIS — I313 Pericardial effusion (noninflammatory): Secondary | ICD-10-CM | POA: Diagnosis not present

## 2021-03-06 DIAGNOSIS — E7800 Pure hypercholesterolemia, unspecified: Secondary | ICD-10-CM | POA: Diagnosis not present

## 2021-03-06 DIAGNOSIS — Z79818 Long term (current) use of other agents affecting estrogen receptors and estrogen levels: Secondary | ICD-10-CM | POA: Diagnosis not present

## 2021-03-06 DIAGNOSIS — I3131 Malignant pericardial effusion in diseases classified elsewhere: Secondary | ICD-10-CM

## 2021-03-06 DIAGNOSIS — C50911 Malignant neoplasm of unspecified site of right female breast: Secondary | ICD-10-CM

## 2021-03-06 DIAGNOSIS — C787 Secondary malignant neoplasm of liver and intrahepatic bile duct: Secondary | ICD-10-CM

## 2021-03-06 LAB — CBC WITH DIFFERENTIAL/PLATELET
Abs Immature Granulocytes: 0.05 10*3/uL (ref 0.00–0.07)
Basophils Absolute: 0.1 10*3/uL (ref 0.0–0.1)
Basophils Relative: 2 %
Eosinophils Absolute: 0 10*3/uL (ref 0.0–0.5)
Eosinophils Relative: 1 %
HCT: 40.9 % (ref 36.0–46.0)
Hemoglobin: 13.9 g/dL (ref 12.0–15.0)
Immature Granulocytes: 1 %
Lymphocytes Relative: 15 %
Lymphs Abs: 0.7 10*3/uL (ref 0.7–4.0)
MCH: 35.4 pg — ABNORMAL HIGH (ref 26.0–34.0)
MCHC: 34 g/dL (ref 30.0–36.0)
MCV: 104.1 fL — ABNORMAL HIGH (ref 80.0–100.0)
Monocytes Absolute: 0.8 10*3/uL (ref 0.1–1.0)
Monocytes Relative: 18 %
Neutro Abs: 2.9 10*3/uL (ref 1.7–7.7)
Neutrophils Relative %: 63 %
Platelets: 187 10*3/uL (ref 150–400)
RBC: 3.93 MIL/uL (ref 3.87–5.11)
RDW: 14.6 % (ref 11.5–15.5)
WBC: 4.6 10*3/uL (ref 4.0–10.5)
nRBC: 0 % (ref 0.0–0.2)

## 2021-03-06 LAB — COMPREHENSIVE METABOLIC PANEL
ALT: 8 U/L (ref 0–44)
AST: 14 U/L — ABNORMAL LOW (ref 15–41)
Albumin: 3.6 g/dL (ref 3.5–5.0)
Alkaline Phosphatase: 112 U/L (ref 38–126)
Anion gap: 11 (ref 5–15)
BUN: 21 mg/dL (ref 8–23)
CO2: 21 mmol/L — ABNORMAL LOW (ref 22–32)
Calcium: 10 mg/dL (ref 8.9–10.3)
Chloride: 109 mmol/L (ref 98–111)
Creatinine, Ser: 1.28 mg/dL — ABNORMAL HIGH (ref 0.44–1.00)
GFR, Estimated: 45 mL/min — ABNORMAL LOW (ref >60)
Glucose, Bld: 126 mg/dL — ABNORMAL HIGH (ref 70–99)
Potassium: 4.6 mmol/L (ref 3.5–5.1)
Sodium: 141 mmol/L (ref 135–145)
Total Bilirubin: 0.2 mg/dL — ABNORMAL LOW (ref 0.3–1.2)
Total Protein: 7.7 g/dL (ref 6.5–8.1)

## 2021-03-06 MED ORDER — DENOSUMAB 120 MG/1.7ML ~~LOC~~ SOLN
SUBCUTANEOUS | Status: AC
  Filled 2021-03-06: qty 1.7

## 2021-03-06 MED ORDER — FULVESTRANT 250 MG/5ML IM SOLN
INTRAMUSCULAR | Status: AC
  Filled 2021-03-06: qty 10

## 2021-03-06 MED ORDER — OXYCODONE-ACETAMINOPHEN 5-325 MG PO TABS: 1.0000 | ORAL_TABLET | Freq: Every evening | ORAL | 0 refills | Status: DC | PRN

## 2021-03-06 MED ORDER — FULVESTRANT 250 MG/5ML IM SOLN
500.0000 mg | Freq: Once | INTRAMUSCULAR | Status: AC
Start: 2021-03-06 — End: 2021-03-06
  Administered 2021-03-06: 500 mg via INTRAMUSCULAR

## 2021-03-06 MED ORDER — DENOSUMAB 120 MG/1.7ML ~~LOC~~ SOLN
120.0000 mg | Freq: Once | SUBCUTANEOUS | Status: AC
  Administered 2021-03-06: 120 mg via SUBCUTANEOUS

## 2021-03-06 NOTE — Patient Instructions (Signed)
Fulvestrant injection What is this medication? FULVESTRANT (ful VES trant) blocks the effects of estrogen. It is used to treat breast cancer. This medicine may be used for other purposes; ask your health care provider or pharmacist if you have questions. COMMON BRAND NAME(S): FASLODEX What should I tell my care team before I take this medication? They need to know if you have any of these conditions: bleeding disorders liver disease low blood counts, like low white cell, platelet, or red cell counts an unusual or allergic reaction to fulvestrant, other medicines, foods, dyes, or preservatives pregnant or trying to get pregnant breast-feeding How should I use this medication? This medicine is for injection into a muscle. It is usually given by a health care professional in a hospital or clinic setting. Talk to your pediatrician regarding the use of this medicine in children. Special care may be needed. Overdosage: If you think you have taken too much of this medicine contact a poison control center or emergency room at once. NOTE: This medicine is only for you. Do not share this medicine with others. What if I miss a dose? It is important not to miss your dose. Call your doctor or health care professional if you are unable to keep an appointment. What may interact with this medication? medicines that treat or prevent blood clots like warfarin, enoxaparin, dalteparin, apixaban, dabigatran, and rivaroxaban This list may not describe all possible interactions. Give your health care provider a list of all the medicines, herbs, non-prescription drugs, or dietary supplements you use. Also tell them if you smoke, drink alcohol, or use illegal drugs. Some items may interact with your medicine. What should I watch for while using this medication? Your condition will be monitored carefully while you are receiving this medicine. You will need important blood work done while you are taking this  medicine. Do not become pregnant while taking this medicine or for at least 1 year after stopping it. Women of child-bearing potential will need to have a negative pregnancy test before starting this medicine. Women should inform their doctor if they wish to become pregnant or think they might be pregnant. There is a potential for serious side effects to an unborn child. Men should inform their doctors if they wish to father a child. This medicine may lower sperm counts. Talk to your health care professional or pharmacist for more information. Do not breast-feed an infant while taking this medicine or for 1 year after the last dose. What side effects may I notice from receiving this medication? Side effects that you should report to your doctor or health care professional as soon as possible: allergic reactions like skin rash, itching or hives, swelling of the face, lips, or tongue feeling faint or lightheaded, falls pain, tingling, numbness, or weakness in the legs signs and symptoms of infection like fever or chills; cough; flu-like symptoms; sore throat vaginal bleeding Side effects that usually do not require medical attention (report to your doctor or health care professional if they continue or are bothersome): aches, pains constipation diarrhea headache hot flashes nausea, vomiting pain at site where injected stomach pain This list may not describe all possible side effects. Call your doctor for medical advice about side effects. You may report side effects to FDA at 1-800-FDA-1088. Where should I keep my medication? This drug is given in a hospital or clinic and will not be stored at home. NOTE: This sheet is a summary. It may not cover all possible information. If you have   Denosumab injection What is this medication? DENOSUMAB (den oh sue mab) slows bone breakdown. Prolia is used to treat osteoporosis in women after  menopause and in men, and in people who are taking corticosteroids for 6 months or more. Xgeva is used to treat a high calcium level due to cancer and to prevent bone fractures and other bone problems caused by multiple myeloma or cancer bone metastases. Xgeva is also used totreat giant cell tumor of the bone. This medicine may be used for other purposes; ask your health care provider orpharmacist if you have questions. COMMON BRAND NAME(S): Prolia, XGEVA What should I tell my care team before I take this medication? They need to know if you have any of these conditions: dental disease having surgery or tooth extraction infection kidney disease low levels of calcium or Vitamin D in the blood malnutrition on hemodialysis skin conditions or sensitivity thyroid or parathyroid disease an unusual reaction to denosumab, other medicines, foods, dyes, or preservatives pregnant or trying to get pregnant breast-feeding How should I use this medication? This medicine is for injection under the skin. It is given by a health careprofessional in a hospital or clinic setting. A special MedGuide will be given to you before each treatment. Be sure to readthis information carefully each time. For Prolia, talk to your pediatrician regarding the use of this medicine in children. Special care may be needed. For Xgeva, talk to your pediatrician regarding the use of this medicine in children. While this drug may be prescribed for children as young as 13 years for selected conditions,precautions do apply. Overdosage: If you think you have taken too much of this medicine contact apoison control center or emergency room at once. NOTE: This medicine is only for you. Do not share this medicine with others. What if I miss a dose? It is important not to miss your dose. Call your doctor or health careprofessional if you are unable to keep an appointment. What may interact with this medication? Do not take this medicine  with any of the following medications: other medicines containing denosumab This medicine may also interact with the following medications: medicines that lower your chance of fighting infection steroid medicines like prednisone or cortisone This list may not describe all possible interactions. Give your health care provider a list of all the medicines, herbs, non-prescription drugs, or dietary supplements you use. Also tell them if you smoke, drink alcohol, or use illegaldrugs. Some items may interact with your medicine. What should I watch for while using this medication? Visit your doctor or health care professional for regular checks on your progress. Your doctor or health care professional may order blood tests andother tests to see how you are doing. Call your doctor or health care professional for advice if you get a fever, chills or sore throat, or other symptoms of a cold or flu. Do not treat yourself. This drug may decrease your body's ability to fight infection. Try toavoid being around people who are sick. You should make sure you get enough calcium and vitamin D while you are taking this medicine, unless your doctor tells you not to. Discuss the foods you eatand the vitamins you take with your health care professional. See your dentist regularly. Brush and floss your teeth as directed. Before youhave any dental work done, tell your dentist you are receiving this medicine. Do not become pregnant while taking this medicine or for 5 months after stopping it. Talk with your doctor or health care professional about   medicine. Do not become pregnant while taking this medicine or for 5 months after stopping it. Talk with your doctor or health care professional about your birth control options while taking this medicine. Women should inform their doctor if they wish to become pregnant or think they might be pregnant. There is a potential for serious side effects to an unborn child. Talk to your health care professional or pharmacist for more information. What side effects may I notice from receiving this  medication? Side effects that you should report to your doctor or health care professional as soon as possible: allergic reactions like skin rash, itching or hives, swelling of the face, lips, or tongue bone pain breathing problems dizziness jaw pain, especially after dental work redness, blistering, peeling of the skin signs and symptoms of infection like fever or chills; cough; sore throat; pain or trouble passing urine signs of low calcium like fast heartbeat, muscle cramps or muscle pain; pain, tingling, numbness in the hands or feet; seizures unusual bleeding or bruising unusually weak or tired Side effects that usually do not require medical attention (report to your doctor or health care professional if they continue or are bothersome): constipation diarrhea headache joint pain loss of appetite muscle pain runny nose tiredness upset stomach This list may not describe all possible side effects. Call your doctor for medical advice about side effects. You may report side effects to FDA at 1-800-FDA-1088. Where should I keep my medication? This medicine is only given in a clinic, doctor's office, or other health care setting and will not be stored at home. NOTE: This sheet is a summary. It may not cover all possible information. If you have questions about this medicine, talk to your doctor, pharmacist, or health care provider.  2022 [employer] (2017-11-21 16:10:44)   2022 [employer] (2017-10-23 11:34:41)  

## 2021-03-06 NOTE — Progress Notes (Signed)
[place]  [phone] [phone]    ID: Barbara Parks DOB: 1950-12-28  MR#: [id number]  PNP#:[id number]  Patient Care Team: [name], MD as PCP - General (Family Medicine) Jerline Pain, MD (Cardiology) Gaye Pollack, MD (Cardiothoracic Surgery) Devonda Pequignot, Virgie Dad, MD as Consulting Physician (Oncology) Raina Mina, RPH-CPP (Pharmacist) Renaldo Harrison, MD as Referring Physician (Radiation Oncology) OTHER MD: Dr Erroll Luna- Merlene Laughter, DDS; Gaynelle Arabian MD   CHIEF COMPLAINT: metastatic breast cancer, formerly on BOLERO study (s/p right mastectomy)  CURRENT TREATMENT: Fulvestrant, ribociclib, denosumab/Xgeva; palliative radiation   INTERVAL HISTORY: Jaana returns today for follow-up and treatment of her metastatic breast cancer.   Since her last visit, she underwent bilateral hip x-ray on 02/06/2021 showing: sclerotic metastatic foci in the left sacrum and right femoral head and neck, no associated fractures.  She was referred to Dr. Lynnette Caffey [facility] in [place]) in radiation oncology on 02/15/2021 for consideration of palliative radiation therapy to the right hip.  She is currently receiving radiation treatments under his care with a total of 30 Gray in 300 cGy fractions planned.  She is tolerating this well although it has not yet begun to relieve her discomfort.  She also underwent restaging liver MRI on 02/21/2021 showing stable disease with no new liver lesions.  Bone scan the same day showed overall no significant change in appearance of multifocal bone metastases.  She was switched to ribociclib on 08/22/2020.  She does have some diarrhea from this which she controls with Imodium.  She does not take Questran.  She also complains of some fatigue.  She continues on fulvestrant every 4 weeks and is tolerating this quite well.  She receives xgeva also every 4 weeks with no issues.  She has no dental concners at present.  She is up to date  on mammography, most recently on 04/13/2020.    REVIEW OF SYSTEMS: Forest is tolerating her radiation treatments well and so far is tolerating the palbociclib without any unusual side effects.  She has not noticed any palpitations chest pain or pressure or worsening shortness of breath.  A detailed review of systems today was stable.   COVID 19 VACCINATION STATUS: Refuses vaccination   BREAST CANCER HISTORY: From Dr. Collier [name]'s original intake note 04/13/2004:  "This woman has been in good health all of her life. She recently moved from [place] to work here.  She palpated a mass at about the 12 o'clock position in mid-July. She has not noticed any nipple retraction or skin changes.  She was seen by her primary care doctor who subsequently referred her for a mammogram.  Mammogram was performed on 03/01/04. This demonstrated a spiculated 2 cm mass at the 12 o'clock position in the right breast.  Upper outer quadrant of the left breast shows some distortion.   Physical exam at that time showed a firm, nontender nodule at the 12 o'clock position in the right breast, 5 cm from the nipple.  Ultrasound of this area showed a hypoechoic ill-defined mass, measuring 2.2 x 1.3 x 1.4 cm.  Physical exam of the left breast showed general vague thickening, upper outer quadrant of the left breast with a discrete palpable mass. The ultrasound performed showed a single hypoechoic ill-defined nodule at the 1 o'clock position, measuring 7 x 5 x 6 mm.    She had biopsies of both lesions on 03/02/03.  Needle core biopsy of the lesion on the right breast revealed invasive mammary carcinoma.  Needle core biopsy of the left breast showed a complex fibroadenoma.  Prognostic panel of the lesion on the right breast showed it to be ER positive at 73%, PR positive at 90% and proliferative index 9%, HER-2 was 1+.  Patient was referred to Dr. Annamaria Boots, who performed a simple mastectomy with sentinel lymph node evaluation on 03/22/04.   Final pathology showed this to be an invasive ductal carcinoma  with lobular features, measuring 2.2 cm, grade 2 of 3.  Margins negative for carcinoma.  Invasive ductal carcinoma was extended to involve deep dermis of the nipple.  Lymphovascular invasion was identified.  Total of 4 sentinel lymph nodes were evaluated.  Touch imprints at the time of the OR was felt to be negative.  Subsequent evaluation showed a 5 mm focus of metastatic carcinoma in one of the four lymph nodes on microscopic after sectioning.  There was extracapsular extension  of one lymph node as well. The remaining three lymph nodes were all negative."  The patient's subsequent history is detailed above.   PAST MEDICAL HISTORY: Past Medical History:  Diagnosis Date   Arthritis    left hip   Asthma    breast ca 2005   breast/chemo R mastectomy   Dermatitis    Diabetes mellitus without complication ([place])    GERD (gastroesophageal reflux disease)    Hypercholesteremia    Metastasis to lung ([place]) dx'd 08/2011   Osteopenia due to cancer therapy 09/09/2013   Palpitations    Personal history of chemotherapy    Shortness of breath     PAST SURGICAL HISTORY: Past Surgical History:  Procedure Laterality Date   BREAST SURGERY  2005   right   CATARACT EXTRACTION W/PHACO Left 01/18/2014   Procedure: CATARACT EXTRACTION PHACO AND INTRAOCULAR LENS PLACEMENT ([place]);  Surgeon: Elta Guadeloupe T. Gershon Crane, MD;  Location: [facility];  Service: Ophthalmology;  Laterality: Left;  CDE 15.79   CATARACT EXTRACTION W/PHACO Right 02/08/2014   Procedure: CATARACT EXTRACTION PHACO AND INTRAOCULAR LENS PLACEMENT ([facility]);  Surgeon: Elta Guadeloupe T. Gershon Crane, MD;  Location: [facility];  Service: Ophthalmology;  Laterality: Right;  CDE 4.41   CHEST TUBE INSERTION  09/11/2011   Procedure: INSERTION PLEURAL DRAINAGE CATHETER;  Surgeon: Gaye Pollack, MD;  Location: [place];  Service: Thoracic;  Laterality: Right;   MASTECTOMY Right    PERICARDIAL WINDOW  09/11/2011   Procedure:  PERICARDIAL WINDOW;  Surgeon: Gaye Pollack, MD;  Location: [place];  Service: Thoracic;  Laterality: N/A;   REMOVAL OF PLEURAL DRAINAGE CATHETER  12/19/2011   Procedure: REMOVAL OF PLEURAL DRAINAGE CATHETER;  Surgeon: Gaye Pollack, MD;  Location: [place];  Service: Thoracic;  Laterality: Right;  TO BE DONE IN MINOR ROOM, [place] Left 06/07/2015   Procedure: LEFT TOTAL HIP ARTHROPLASTY ANTERIOR APPROACH;  Surgeon: Gaynelle Arabian, MD;  Location: [facility];  Service: Orthopedics;  Laterality: Left;   VIDEO BRONCHOSCOPY  09/11/2011   Procedure: VIDEO BRONCHOSCOPY;  Surgeon: Gaye Pollack, MD;  Location: [facility];  Service: Thoracic;  Laterality: N/A;    FAMILY HISTORY Family History  Problem Relation Age of Onset   Cancer Mother        breast   Heart disease Father    Diabetes Other    Anesthesia problems Neg Hx   The patient's mother was diagnosed with breast cancer at age 42, she died age 80 from congestive heart failure.The patient's father died from heart  disease at age 12.  She has one sister alive & well.  Two brothers alive & well, one with diabetes. The patient's sister was also diagnosed with breast cancer, and was tested for the BRCA gene, and was negative. The patient herself has not been tested. There is no history of ovarian cancer in the family   GYNECOLOGIC HISTORY:  No LMP recorded. Patient is postmenopausal. Menarche age 17, the patient is GX P0. She stopped having periods with chemotherapy in 2005. 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 She was a Archivist. She is single, lives alone with her Shitzu-poodle Sammie.. Family is all in the [place] area.     ADVANCED DIRECTIVES: Not in place. At the 02/23/2014 visit the patient was given the appropriate documents to complete and notarize at her discretion. She tells  me she is planning to name her sister, Billie Ruddy, as healthcare power of attorney. [name] can be reached at [phone]   HEALTH MAINTENANCE: Social History   Tobacco Use   Smoking status: Former    Packs/day: 1.50    Years: 30.00    Pack years: 45.00    Types: Cigarettes    Quit date: 09/10/2007    Years since quitting: 13.4   Smokeless tobacco: Never  Substance Use Topics   Alcohol use: No   Drug use: No    Colonoscopy:  PAP:  Bone density: 04/09/2018, T score -1.8  Lipid panel:  Allergies  Allergen Reactions   Aspirin     REACTION: upset stomach   Latex Other (See Comments)    Blistering and skin peels off   Nsaids Nausea And Vomiting    Extreme nausea and vomiting    Current Outpatient Medications  Medication Sig Dispense Refill   oxyCODONE-acetaminophen (PERCOCET/ROXICET) 5-325 MG tablet Take 1 tablet by mouth at bedtime as needed for severe pain. 30 tablet 0   cholecalciferol (VITAMIN D3) 25 MCG (1000 UT) tablet Take 2 tablets (2,000 Units total) by mouth daily. 180 tablet 4   cyclobenzaprine (FLEXERIL) 10 MG tablet Take 1 tablet (10 mg total) by mouth 2 (two) times daily as needed for muscle spasms. 20 tablet 0   gemfibrozil (LOPID) 600 MG tablet TAKE 1 TABLET (600 MG TOTAL) BY MOUTH 2 (TWO) TIMES DAILY BEFORE A MEAL. 180 tablet 1   loperamide (IMODIUM) 2 MG capsule Take 2 mg by mouth 4 (four) times daily as needed for diarrhea or loose stools. Reported on 11/30/2015     metFORMIN (GLUCOPHAGE-XR) 500 MG 24 hr tablet Take 1 tablet (500 mg total) by mouth 3 (three) times daily before meals.  3   prochlorperazine (COMPAZINE) 10 MG tablet Take 1 tablet (10 mg total) by mouth every 8 (eight) hours as needed for nausea or vomiting. 30 tablet 0   ribociclib succ (KISQALI 600MG DAILY DOSE) 200 MG Therapy Pack TAKE 3 TABLETS (600 MG TOTAL) BY MOUTH DAILY. TAKE FOR 21 DAYS ON, 7 DAYS OFF, REPEAT EVERY 28 DAYS. START 08/23/2020 63 each 0   traMADol (ULTRAM) 50 MG tablet Take  0.5-1 tablets (25-50 mg total) by mouth every 6 (six) hours as needed. 30 tablet 0   No current facility-administered medications for this visit.    OBJECTIVE: White woman who appears younger than stated age 69:   03/06/21 1225  BP: 117/62  Pulse: (!) 116  Resp: 18  Temp: Marland Kitchen)  97.5 F (36.4 C)  SpO2: 95%   Wt Readings from Last 3 Encounters:  03/06/21 151 lb (68.5 kg)  02/06/21 150 lb 12.8 oz (68.4 kg)  11/14/20 153 lb (69.4 kg)   Body mass index is 25.52 kg/m.    ECOG FS:1 - Symptomatic but completely ambulatory  Sclerae unicteric, EOMs intact Wearing a mask No cervical or supraclavicular adenopathy Lungs no rales or rhonchi Heart regular rate and rhythm Abd soft, nontender, positive bowel sounds MSK lower thoracic spinal tenderness, no upper extremity lymphedema Neuro: nonfocal, well oriented, appropriate affect Breasts: Deferred   LAB  RESULTS:  CMP     Component Value Date/Time   NA 141 03/06/2021 1204   NA 139 07/08/2017 0942   K 4.6 03/06/2021 1204   K 3.8 07/08/2017 0942   CL 109 03/06/2021 1204   CL 109 (H) 12/31/2012 0940   CO2 21 (L) 03/06/2021 1204   CO2 18 (L) 07/08/2017 0942   GLUCOSE 126 (H) 03/06/2021 1204   GLUCOSE 156 (H) 07/08/2017 0942   GLUCOSE 127 (H) 12/31/2012 0940   BUN 21 03/06/2021 1204   BUN 17.7 07/08/2017 0942   CREATININE 1.28 (H) 03/06/2021 1204   CREATININE 1.18 (H) 12/25/2017 0959   CREATININE 1.0 07/08/2017 0942   CALCIUM 10.0 03/06/2021 1204   CALCIUM 10.4 07/08/2017 0942   PROT 7.7 03/06/2021 1204   PROT 7.7 07/08/2017 0942   ALBUMIN 3.6 03/06/2021 1204   ALBUMIN 3.4 (L) 07/08/2017 0942   AST 14 (L) 03/06/2021 1204   AST 50 (H) 12/25/2017 0959   AST 54 (H) 07/08/2017 0942   ALT 8 03/06/2021 1204   ALT 53 12/25/2017 0959   ALT 39 07/08/2017 0942   ALKPHOS 112 03/06/2021 1204   ALKPHOS 232 (H) 07/08/2017 0942   BILITOT 0.2 (L) 03/06/2021 1204   BILITOT 0.3 12/25/2017 0959   BILITOT 0.42 07/08/2017 0942    GFRNONAA 45 (L) 03/06/2021 1204   GFRNONAA 47 (L) 12/25/2017 0959   GFRAA 49 (L) 04/05/2020 1504   GFRAA 54 (L) 12/25/2017 0959    I No results found for: SPEP  Lab Results  Component Value Date   WBC 4.6 03/06/2021   NEUTROABS 2.9 03/06/2021   HGB 13.9 03/06/2021   HCT 40.9 03/06/2021   MCV 104.1 (H) 03/06/2021   PLT 187 03/06/2021      Chemistry      Component Value Date/Time   NA 141 03/06/2021 1204   NA 139 07/08/2017 0942   K 4.6 03/06/2021 1204   K 3.8 07/08/2017 0942   CL 109 03/06/2021 1204   CL 109 (H) 12/31/2012 0940   CO2 21 (L) 03/06/2021 1204   CO2 18 (L) 07/08/2017 0942   BUN 21 03/06/2021 1204   BUN 17.7 07/08/2017 0942   CREATININE 1.28 (H) 03/06/2021 1204   CREATININE 1.18 (H) 12/25/2017 0959   CREATININE 1.0 07/08/2017 0942      Component Value Date/Time   CALCIUM 10.0 03/06/2021 1204   CALCIUM 10.4 07/08/2017 0942   ALKPHOS 112 03/06/2021 1204   ALKPHOS 232 (H) 07/08/2017 0942   AST 14 (L) 03/06/2021 1204   AST 50 (H) 12/25/2017 0959   AST 54 (H) 07/08/2017 0942   ALT 8 03/06/2021 1204   ALT 53 12/25/2017 0959   ALT 39 07/08/2017 0942   BILITOT 0.2 (L) 03/06/2021 1204   BILITOT 0.3 12/25/2017 0959   BILITOT 0.42 07/08/2017 0942       Lab Results  Component Value  Date   LABCA2 67 (H) 09/14/2012    No components found for: MBTDH741  No results for input(s): INR in the last 168 hours.  Urinalysis    Component Value Date/Time   COLORURINE YELLOW 11/25/2017 0901   APPEARANCEUR HAZY (A) 11/25/2017 0901   LABSPEC 1.017 11/25/2017 0901   LABSPEC 1.030 07/13/2015 0841   PHURINE 5.0 11/25/2017 0901   GLUCOSEU NEGATIVE 11/25/2017 0901   GLUCOSEU Negative 07/13/2015 0841   HGBUR MODERATE (A) 11/25/2017 0901   BILIRUBINUR NEGATIVE 11/25/2017 0901   BILIRUBINUR Negative 07/13/2015 0841   KETONESUR NEGATIVE 11/25/2017 0901   PROTEINUR 100 (A) 11/25/2017 0901   UROBILINOGEN 0.2 07/13/2015 0841   NITRITE NEGATIVE 11/25/2017 0901    LEUKOCYTESUR NEGATIVE 11/25/2017 0901   LEUKOCYTESUR Negative 07/13/2015 0841    STUDIES: NM Bone Scan Whole Body  Result Date: 02/23/2021 CLINICAL DATA:  Restaging breast cancer. EXAM: NUCLEAR MEDICINE WHOLE BODY BONE SCAN TECHNIQUE: Whole body anterior and posterior images were obtained approximately 3 hours after intravenous injection of radiopharmaceutical. RADIOPHARMACEUTICALS:  21.2 mCi Technetium-60mMDP IV COMPARISON:  None/28/21 FINDINGS: Again seen are multifocal abnormal areas of increased uptake compatible with osseous metastatic disease. Lesions are noted within the sternal manubrium, bilateral scapula, upper thoracic spine, proximal right femur, and bilateral ribs. When compared with the previous exam uptake within the proximal right femur is improved in the interval. Mild increased uptake associated with the sternal manubrial lesion. Remaining areas of increased uptake appears stable. No new sites of disease. Physiologic activity seen within the kidneys an urinary bladder. IMPRESSION: 1. Overall no significant change in the appearance of multifocal bone metastases. Electronically Signed   By: TKerby MoorsM.D.   On: 02/23/2021 14:49   MR LIVER W WO CONTRAST  Result Date: 02/22/2021 CLINICAL DATA:  Breast cancer with liver metastases.  Restaging. EXAM: MRI ABDOMEN WITHOUT AND WITH CONTRAST TECHNIQUE: Multiplanar multisequence MR imaging of the abdomen was performed both before and after the administration of intravenous contrast. CONTRAST:  732mGADAVIST GADOBUTROL 1 MMOL/ML IV SOLN COMPARISON:  11/11/2020 MRI abdomen. FINDINGS: Lower chest: Small dependent right pleural effusion, stable. Hepatobiliary: Normal liver size and configuration. No hepatic steatosis. Several (at least 7) mildly T2 hyperintense liver masses scattered throughout the left and right liver lobes with targetoid enhancement, not substantially changed. Representative 1.7 x 1.5 cm segment 3 left liver lesion (series  4/image 16), previously 1.6 x 1.5 cm using similar measurement technique. Posterior segment 3 left liver 1.2 x 1.0 cm lesion (series 4/image 15), previously 1.1 x 0.9 cm. Segment 5 right liver 1.2 x 1.1 cm lesion (series 4/image 17), previously 1.1 x 1.0 cm. No appreciable new liver lesions. Mild focal adenomyomatosis in the fundal gallbladder wall. Otherwise normal gallbladder, with no cholelithiasis. No biliary ductal dilatation. Common bile duct diameter 6 mm. No choledocholithiasis. No biliary masses, strictures or beading. Small periampullary duodenal diverticulum, chronic. Pancreas: No pancreatic mass or duct dilation.  No pancreas divisum. Spleen: Normal size. No mass. Adrenals/Urinary Tract: Normal right adrenal. Stable 1.5 cm left adrenal adenoma with prominent loss of signal intensity on chemical shift imaging. No hydronephrosis. Tiny T1 hyperintense 0.9 cm renal cortical lesion in the upper right kidney without appreciable enhancement on the postcontrast subtraction sequences (series 17/image 75), compatible with a Bosniak category 2 hemorrhagic/proteinaceous renal cyst. Tiny subcentimeter posterior left renal simple cyst. No suspicious renal masses. Stomach/Bowel: Normal non-distended stomach. Visualized small and large bowel is normal caliber, with no bowel wall thickening. Vascular/Lymphatic: Atherosclerotic nonaneurysmal abdominal  aorta. Patent portal, splenic, hepatic and renal veins. No pathologically enlarged lymph nodes in the abdomen. Other: No abdominal ascites or focal fluid collection. Musculoskeletal: Enhancing low T2 signal intensity right L3 lesion (series 7/image 21) and similar smaller low T2 signal intensity right T10 and T11 vertebral lesions (series 7/image 24), not appreciably changed. No additional bone lesions. IMPRESSION: 1. Stable liver metastases. No appreciable new liver lesions. 2. Stable small dependent right pleural effusion. 3. Stable osseous metastases to the thoracolumbar  spine. 4. Stable left adrenal adenoma. Electronically Signed   By: Ilona Sorrel M.D.   On: 02/22/2021 21:38   DG HIPS BILAT WITH PELVIS 2V  Result Date: 02/08/2021 CLINICAL DATA:  Progressive right hip and back pain. Metastatic breast cancer, including bone metastases. EXAM: DG HIP (WITH OR WITHOUT PELVIS) 2V BILAT COMPARISON:  Pelvis and left hip dated 05/17/2019. Nuclear medicine bone scan dated 04/26/2020. FINDINGS: Stable left hip prosthesis. Interval multiple small, oval and rounded sclerotic foci in the right femoral neck and inferior right femoral head. There is also an oval sclerotic area overlying the mid left sacral body. There was increased tracer uptake associated with these lesions on the previous radionuclide bone scan. Normal appearing right hip joint space. Mild lower lumbar spine degenerative changes. IMPRESSION: Sclerotic metastatic foci in the left sacrum and right femoral head and neck compatible with the patient's known metastatic breast cancer. No associated fractures visualized. Electronically Signed   By: Claudie Revering M.D.   On: 02/08/2021 16:39      ASSESSMENT: 71 y.o. [place], [place] woman with stage IV breast cancer, on BOLERO-4 trial  (1) status post right mastectomy and sentinel lymph node sampling 03/22/2004 for a right upper-outer quadrant pT2 pN1, stage IIB invasive ductal carcinoma with lobular features, grade 2, estrogen receptor and progesterone receptor positive, HER-2 negative, with an MIB-1 of 9% ([id number] and [id number])  (2) addition all right axillary lymph node sampling 05/07/2004 showed 2 benign lymph nodes (4 lymph nodes previously removed, so total was one positive lymph node out of 6; [id number])  (3) the patient was evaluated by radiation oncology; no postmastectomy radiation recommended  (4) adjuvant chemotherapy with dose dense doxorubicin and cyclophosphamide x4 cycles (first cycle delayed one week) followed by dose dense paclitaxel x4 was completed  09/18/2004  (5) tamoxifen started March 2006, discontinued 2009  METASTATIC DISEASE: February 2013 (6) presenting with a large pericardial effusion, large right pleural effusion and possible right middle lobe bronchial obstruction February 2013, status post pericardial window placement, fiberoptic bronchoscopy and right Pleurx placement 09/11/2011, with biopsy of the bronchus intermedius and pericardium positive for metastatic breast cancer, estrogen receptor 91% positive with moderate staining intensity, progesterone receptor 100% positive with strong staining intensity, with an MIB-1 of 35%, and no HER-2 amplification, the signals ratio being 1.37 (SZA [id number])  (7) enrolled in BOLERO-4 trial 10/10/2011, receiving letrozole and everolimus  (a) two small areas of enhancement in the cerebellum noted by brain MRI 10/03/2011 were no longer apparent on repeat MRI 08/21/2012-- most recent brain MRI 04/01/2014 showed no evidence of intracranial metastatic disease  (b) sclerotic lesions in left iliac bone and sacrum have not been biopsied; stable; to start zolendronate after patient updates her dental care (extraction planned)--never started  (c) RLL lung nodule, stable (rescanned 07/12/2014 and 08/11/2014) R hilar and subcarinal lymph nodes: stable  (d) CT of the chest:03/25/2018, stable  (e) CT Angio 12/12/2018 stable  (f) off BOLERO trial (trial ended) as of July 2020, but continuing on study  drugs  (g) letrozole and everolimus discontinued January 2021 with disease progression  (8) additional problems:  (a) hepatic steatosis  (b) COPD/ emphysema/ asthma  (c) advanced L hip osteoarthritis, status post left total hip replacement 06/07/2015  (d) aortoiliac atherosclerosis  (e) dental evaluation pending w possible dental extractions  (f) likely thalassemia trait  (g) hyperlipidemia  (9) Bone density concerns: DEXA scan 08/11/2013 was normal  (a) repeat DEXA scan 04/09/2018 shows a T score of -1.8,  osteopenia  (10) disease monitoring:  (a) PET scan 12/22/2018 shows no hypermetabolic soft tissue metastases but multifocal bony metastasis  (b) denosumab/Xgeva started on 02/25/2019, repeated every 28 days (delayed due to dental extraction healing issues)  (c) bone scan 05/13/2019 serves as baseline study with increased activity in the right scapular, lower ribs and right femoral head (stable compared to PET scan of May 2020  (d) bone scan 07/09/2019 stable  (e) CT chest 07/09/2019 shows stable lung lesions but emerging left liver lobe 0.7 cm lesion  (f) liver MRI 08/03/2019 shows approximately 8 liver lesions, the largest measuring 1.8 cm.  (g) liver biopsy 08/06/2019 confirms metastatic breast cancer, estrogen and progesterone receptor positive, with an MIB-1 of 5% and HER-2 not amplified  (h) chest CT and bone scan 11/11/2019 essentially stable  (i) liver MRI 02/01/2020 stable to improved  (j) chest CT and bone scan 04/26/2020 stable  (Continued under #12)  (11) denosumab/Xgeva begun 02/25/2019 repeated every 4 weeks  (12) fulvestrant started 08/24/2019, repeated every 4 weeks  (a) palbociclib 125 mg daily 21 days on 7 days off started 08/25/2019  (b) chest CT 07/24/2020 stable  (c) liver MRI 07/25/2020 shows mild progression in measurable disease  (d) palbociclib discontinued and ribociclib substituted starting 08/22/2020   (i) QTc on 08/22/2020 was 430   (ii) QTc on 10/17/2020 was 441  (e) liver MRI 04/1 02/22/2021 shows stable disease   (f) liver MRI 02/2021 shows stable disease  (g) bone scan 02/21/2021 shows no significant change  (13) foundation 1 requested on liver biopsy from 08/16/2019 shows a stable microsatellite status with 1 mutation/MB, amplification of C11orf30, FGF10 and RICTOR, with mutations in ESR 1 and PTEN  (14) palliative radiation (Dr. Lynnette Caffey in [place]) 02/28/2021 to   PLAN: Deloria is struggling a bit with the concurrent treatments she is receiving and also  palliative radiation.  She is fatigued.  She continues to have pain.  She is very stoic all and uses very minimal amounts of pain medicine.  She finds narcotics something she very much wants to avoid.  We discussed alternatives today and I wrote her for Percocet so she may take at bedtime or occasionally at other times when it is very important for her to have the pain controlled.  Systemically we are continuing with the Faslodex/Kisqali/Xgeva and plan to restage at the end of October.  She will see me again with her next treatment, in 4 weeks.  She knows to call for any other issues that may develop before then.  Total encounter time 25 minutes.Sarajane Jews C. Missie Gehrig, MD 03/06/21 8:55 PM Medical Oncology and H[facility]ology [facility] [place], [place] [address] Tel. [phone]    Fax. [phone]   I, Wilburn Mylar, am acting as scribe for Dr. Virgie Dad. Suzy Kugel.  I, Lurline Del MD, have reviewed the above documentation for accuracy and completeness, and I agree with the above.   *Total Encounter Time as defined by the Centers for Medicare and  Medicaid Services includes, in addition to the face-to-face time of a patient visit (documented in the note above) non-face-to-face time: obtaining and reviewing outside history, ordering and reviewing medications, tests or procedures, care coordination (communications with other health care professionals or caregivers) and documentation in the medical record.

## 2021-03-07 DIAGNOSIS — C50911 Malignant neoplasm of unspecified site of right female breast: Secondary | ICD-10-CM | POA: Diagnosis not present

## 2021-03-07 DIAGNOSIS — Z170 Estrogen receptor positive status [ER+]: Secondary | ICD-10-CM | POA: Diagnosis not present

## 2021-03-07 DIAGNOSIS — Z79818 Long term (current) use of other agents affecting estrogen receptors and estrogen levels: Secondary | ICD-10-CM | POA: Diagnosis not present

## 2021-03-07 DIAGNOSIS — E119 Type 2 diabetes mellitus without complications: Secondary | ICD-10-CM | POA: Diagnosis not present

## 2021-03-07 DIAGNOSIS — C7951 Secondary malignant neoplasm of bone: Secondary | ICD-10-CM | POA: Diagnosis not present

## 2021-03-07 DIAGNOSIS — E7800 Pure hypercholesterolemia, unspecified: Secondary | ICD-10-CM | POA: Diagnosis not present

## 2021-03-08 ENCOUNTER — Telehealth: Payer: Self-pay | Admitting: Oncology

## 2021-03-08 ENCOUNTER — Other Ambulatory Visit: Payer: Self-pay | Admitting: *Deleted

## 2021-03-08 DIAGNOSIS — E119 Type 2 diabetes mellitus without complications: Secondary | ICD-10-CM | POA: Diagnosis not present

## 2021-03-08 DIAGNOSIS — Z79818 Long term (current) use of other agents affecting estrogen receptors and estrogen levels: Secondary | ICD-10-CM | POA: Diagnosis not present

## 2021-03-08 DIAGNOSIS — E7800 Pure hypercholesterolemia, unspecified: Secondary | ICD-10-CM | POA: Diagnosis not present

## 2021-03-08 DIAGNOSIS — C7951 Secondary malignant neoplasm of bone: Secondary | ICD-10-CM | POA: Diagnosis not present

## 2021-03-08 DIAGNOSIS — Z170 Estrogen receptor positive status [ER+]: Secondary | ICD-10-CM | POA: Diagnosis not present

## 2021-03-08 DIAGNOSIS — C50911 Malignant neoplasm of unspecified site of right female breast: Secondary | ICD-10-CM | POA: Diagnosis not present

## 2021-03-08 MED ORDER — RIBOCICLIB SUCC (600 MG DOSE) 200 MG PO TBPK: ORAL_TABLET | ORAL | 0 refills | Status: DC

## 2021-03-08 NOTE — Telephone Encounter (Signed)
Scheduled appts per 8/9 los. Called pt, no answer. Left msg with appts date and times.

## 2021-03-09 DIAGNOSIS — Z79818 Long term (current) use of other agents affecting estrogen receptors and estrogen levels: Secondary | ICD-10-CM | POA: Diagnosis not present

## 2021-03-09 DIAGNOSIS — E119 Type 2 diabetes mellitus without complications: Secondary | ICD-10-CM | POA: Diagnosis not present

## 2021-03-09 DIAGNOSIS — Z170 Estrogen receptor positive status [ER+]: Secondary | ICD-10-CM | POA: Diagnosis not present

## 2021-03-09 DIAGNOSIS — E7800 Pure hypercholesterolemia, unspecified: Secondary | ICD-10-CM | POA: Diagnosis not present

## 2021-03-09 DIAGNOSIS — C7951 Secondary malignant neoplasm of bone: Secondary | ICD-10-CM | POA: Diagnosis not present

## 2021-03-09 DIAGNOSIS — C50911 Malignant neoplasm of unspecified site of right female breast: Secondary | ICD-10-CM | POA: Diagnosis not present

## 2021-03-12 DIAGNOSIS — C7951 Secondary malignant neoplasm of bone: Secondary | ICD-10-CM | POA: Diagnosis not present

## 2021-03-12 DIAGNOSIS — E7800 Pure hypercholesterolemia, unspecified: Secondary | ICD-10-CM | POA: Diagnosis not present

## 2021-03-12 DIAGNOSIS — C50911 Malignant neoplasm of unspecified site of right female breast: Secondary | ICD-10-CM | POA: Diagnosis not present

## 2021-03-12 DIAGNOSIS — Z170 Estrogen receptor positive status [ER+]: Secondary | ICD-10-CM | POA: Diagnosis not present

## 2021-03-12 DIAGNOSIS — E119 Type 2 diabetes mellitus without complications: Secondary | ICD-10-CM | POA: Diagnosis not present

## 2021-03-12 DIAGNOSIS — Z79818 Long term (current) use of other agents affecting estrogen receptors and estrogen levels: Secondary | ICD-10-CM | POA: Diagnosis not present

## 2021-03-13 DIAGNOSIS — C50911 Malignant neoplasm of unspecified site of right female breast: Secondary | ICD-10-CM | POA: Diagnosis not present

## 2021-03-13 DIAGNOSIS — E7800 Pure hypercholesterolemia, unspecified: Secondary | ICD-10-CM | POA: Diagnosis not present

## 2021-03-13 DIAGNOSIS — Z79818 Long term (current) use of other agents affecting estrogen receptors and estrogen levels: Secondary | ICD-10-CM | POA: Diagnosis not present

## 2021-03-13 DIAGNOSIS — E119 Type 2 diabetes mellitus without complications: Secondary | ICD-10-CM | POA: Diagnosis not present

## 2021-03-13 DIAGNOSIS — C7951 Secondary malignant neoplasm of bone: Secondary | ICD-10-CM | POA: Diagnosis not present

## 2021-03-13 DIAGNOSIS — Z170 Estrogen receptor positive status [ER+]: Secondary | ICD-10-CM | POA: Diagnosis not present

## 2021-03-14 DIAGNOSIS — E7800 Pure hypercholesterolemia, unspecified: Secondary | ICD-10-CM | POA: Diagnosis not present

## 2021-03-14 DIAGNOSIS — Z170 Estrogen receptor positive status [ER+]: Secondary | ICD-10-CM | POA: Diagnosis not present

## 2021-03-14 DIAGNOSIS — C7951 Secondary malignant neoplasm of bone: Secondary | ICD-10-CM | POA: Diagnosis not present

## 2021-03-14 DIAGNOSIS — E119 Type 2 diabetes mellitus without complications: Secondary | ICD-10-CM | POA: Diagnosis not present

## 2021-03-14 DIAGNOSIS — C50911 Malignant neoplasm of unspecified site of right female breast: Secondary | ICD-10-CM | POA: Diagnosis not present

## 2021-03-14 DIAGNOSIS — Z79818 Long term (current) use of other agents affecting estrogen receptors and estrogen levels: Secondary | ICD-10-CM | POA: Diagnosis not present

## 2021-03-23 DIAGNOSIS — H9193 Unspecified hearing loss, bilateral: Secondary | ICD-10-CM | POA: Diagnosis not present

## 2021-03-23 DIAGNOSIS — C7951 Secondary malignant neoplasm of bone: Secondary | ICD-10-CM | POA: Diagnosis not present

## 2021-03-27 ENCOUNTER — Other Ambulatory Visit: Payer: Self-pay | Admitting: Oncology

## 2021-03-27 DIAGNOSIS — Z1231 Encounter for screening mammogram for malignant neoplasm of breast: Secondary | ICD-10-CM

## 2021-04-05 ENCOUNTER — Inpatient Hospital Stay (HOSPITAL_BASED_OUTPATIENT_CLINIC_OR_DEPARTMENT_OTHER): Payer: Medicare Other | Admitting: Adult Health

## 2021-04-05 ENCOUNTER — Other Ambulatory Visit: Payer: Self-pay

## 2021-04-05 ENCOUNTER — Inpatient Hospital Stay: Payer: Medicare Other

## 2021-04-05 ENCOUNTER — Inpatient Hospital Stay: Payer: Medicare Other | Attending: Oncology

## 2021-04-05 VITALS — BP 117/69 | HR 116 | Temp 97.5°F | Resp 18 | Ht 64.0 in | Wt 148.8 lb

## 2021-04-05 DIAGNOSIS — C7800 Secondary malignant neoplasm of unspecified lung: Secondary | ICD-10-CM

## 2021-04-05 DIAGNOSIS — C50411 Malignant neoplasm of upper-outer quadrant of right female breast: Secondary | ICD-10-CM | POA: Diagnosis not present

## 2021-04-05 DIAGNOSIS — Z79899 Other long term (current) drug therapy: Secondary | ICD-10-CM | POA: Insufficient documentation

## 2021-04-05 DIAGNOSIS — I3131 Malignant pericardial effusion in diseases classified elsewhere: Secondary | ICD-10-CM

## 2021-04-05 DIAGNOSIS — C50911 Malignant neoplasm of unspecified site of right female breast: Secondary | ICD-10-CM | POA: Diagnosis not present

## 2021-04-05 DIAGNOSIS — C787 Secondary malignant neoplasm of liver and intrahepatic bile duct: Secondary | ICD-10-CM

## 2021-04-05 DIAGNOSIS — C7951 Secondary malignant neoplasm of bone: Secondary | ICD-10-CM | POA: Insufficient documentation

## 2021-04-05 DIAGNOSIS — Z5111 Encounter for antineoplastic chemotherapy: Secondary | ICD-10-CM | POA: Diagnosis not present

## 2021-04-05 DIAGNOSIS — C801 Malignant (primary) neoplasm, unspecified: Secondary | ICD-10-CM

## 2021-04-05 DIAGNOSIS — Z170 Estrogen receptor positive status [ER+]: Secondary | ICD-10-CM

## 2021-04-05 LAB — CBC WITH DIFFERENTIAL/PLATELET
Abs Immature Granulocytes: 0.06 10*3/uL (ref 0.00–0.07)
Basophils Absolute: 0.1 10*3/uL (ref 0.0–0.1)
Basophils Relative: 2 %
Eosinophils Absolute: 0 10*3/uL (ref 0.0–0.5)
Eosinophils Relative: 1 %
HCT: 42.3 % (ref 36.0–46.0)
Hemoglobin: 14.3 g/dL (ref 12.0–15.0)
Immature Granulocytes: 1 %
Lymphocytes Relative: 15 %
Lymphs Abs: 0.7 10*3/uL (ref 0.7–4.0)
MCH: 34.9 pg — ABNORMAL HIGH (ref 26.0–34.0)
MCHC: 33.8 g/dL (ref 30.0–36.0)
MCV: 103.2 fL — ABNORMAL HIGH (ref 80.0–100.0)
Monocytes Absolute: 0.8 10*3/uL (ref 0.1–1.0)
Monocytes Relative: 18 %
Neutro Abs: 2.9 10*3/uL (ref 1.7–7.7)
Neutrophils Relative %: 63 %
Platelets: 175 10*3/uL (ref 150–400)
RBC: 4.1 MIL/uL (ref 3.87–5.11)
RDW: 14.9 % (ref 11.5–15.5)
WBC: 4.6 10*3/uL (ref 4.0–10.5)
nRBC: 0 % (ref 0.0–0.2)

## 2021-04-05 LAB — COMPREHENSIVE METABOLIC PANEL
ALT: 7 U/L (ref 0–44)
AST: 13 U/L — ABNORMAL LOW (ref 15–41)
Albumin: 3.7 g/dL (ref 3.5–5.0)
Alkaline Phosphatase: 110 U/L (ref 38–126)
Anion gap: 12 (ref 5–15)
BUN: 22 mg/dL (ref 8–23)
CO2: 20 mmol/L — ABNORMAL LOW (ref 22–32)
Calcium: 10.1 mg/dL (ref 8.9–10.3)
Chloride: 107 mmol/L (ref 98–111)
Creatinine, Ser: 1.31 mg/dL — ABNORMAL HIGH (ref 0.44–1.00)
GFR, Estimated: 44 mL/min — ABNORMAL LOW (ref >60)
Glucose, Bld: 130 mg/dL — ABNORMAL HIGH (ref 70–99)
Potassium: 4.5 mmol/L (ref 3.5–5.1)
Sodium: 139 mmol/L (ref 135–145)
Total Bilirubin: 0.3 mg/dL (ref 0.3–1.2)
Total Protein: 7.9 g/dL (ref 6.5–8.1)

## 2021-04-05 MED ORDER — RIBOCICLIB SUCC (600 MG DOSE) 200 MG PO TBPK: ORAL_TABLET | ORAL | 0 refills | Status: DC

## 2021-04-05 MED ORDER — FULVESTRANT 250 MG/5ML IM SOLN
500.0000 mg | Freq: Once | INTRAMUSCULAR | Status: AC
Start: 2021-04-05 — End: 2021-04-05
  Administered 2021-04-05: 500 mg via INTRAMUSCULAR
  Filled 2021-04-05: qty 10

## 2021-04-05 MED ORDER — DENOSUMAB 120 MG/1.7ML ~~LOC~~ SOLN
120.0000 mg | Freq: Once | SUBCUTANEOUS | Status: AC
  Administered 2021-04-05: 120 mg via SUBCUTANEOUS
  Filled 2021-04-05: qty 1.7

## 2021-04-05 NOTE — Progress Notes (Signed)
[facility]  [phone] [phone]    ID: Barbara Parks DOB: 01-09-1951  MR#: [id number]  QMG#:[id number]  Patient Care Team: Barbara Small, MD as PCP - General (Family Medicine) Barbara Pain, MD (Cardiology) Barbara Pollack, MD (Cardiothoracic Surgery) Parks, Barbara Dad, MD as Consulting Physician (Oncology) Barbara Parks, RPH-CPP (Pharmacist) Barbara Harrison, MD as Referring Physician (Radiation Oncology) OTHER MD: Barbara Parks- Barbara Parks, DDS; Barbara Arabian MD   CHIEF COMPLAINT: metastatic breast cancer, formerly on BOLERO study (s/p right mastectomy)  CURRENT TREATMENT: Fulvestrant, ribociclib, denosumab/Xgeva; palliative radiation   INTERVAL HISTORY: Barbara Parks returns today for follow-up and treatment of her metastatic breast cancer.   She completed radiation therapy in [place], and her right hip Parks is improved some.  She is going out of town next week and will play golf with her brother and will see how much imporved when she gets out on the golf course.   Her most recent restaging liver MRI on 02/21/2021 showing stable disease with no new liver lesions.  Bone scan the same day showed overall no significant change in appearance of multifocal bone metastases.  Barbara Parks is taking the Ribociclib 237m, three tablets per day 21 days on and 7 days off.  She is completing her week off today, and will restart these tomorrow.    She continues on fulvestrant every 4 weeks and is tolerating this quite well.  She receives xgeva also every 4 weeks with no issues.  She has no dental concners at present.  She is up to date on mammography, and is scheduled for repeat in mid October, 2022.    REVIEW OF SYSTEMS: Review of Systems  Constitutional:  Negative for appetite change, chills, fatigue, fever and unexpected weight change.  HENT:   Negative for hearing loss, lump/mass and trouble swallowing.   Eyes:  Negative for eye problems and icterus.   Respiratory:  Negative for chest tightness, cough and shortness of breath.   Cardiovascular:  Negative for chest Parks, leg swelling and palpitations.  Gastrointestinal:  Negative for abdominal distention, abdominal Parks, constipation, diarrhea, nausea and vomiting.  Endocrine: Negative for hot flashes.  Genitourinary:  Negative for difficulty urinating.   Musculoskeletal:  Negative for arthralgias.  Skin:  Negative for itching and rash.  Neurological:  Negative for dizziness, extremity weakness, headaches and numbness.  Hematological:  Negative for adenopathy. Does not bruise/bleed easily.  Psychiatric/Behavioral:  Negative for depression. The patient is not nervous/anxious.     COVID 19 VACCINATION STATUS: Refuses vaccination   BREAST CANCER HISTORY: From Barbara. PCollier Parks's original intake note 04/13/2004:  "This woman has been in good health all of her life. [name].  She palpated a mass at about the 12 o'clock position in mid-July. She has not noticed any nipple retraction or skin changes.  She was seen by her primary care doctor who subsequently referred her for a mammogram.  Mammogram was performed on 03/01/04. This demonstrated a spiculated 2 cm mass at the 12 o'clock position in the right breast.  Upper outer quadrant of the left breast shows some distortion.   Physical exam at that time showed a firm, nontender nodule at the 12 o'clock position in the right breast, 5 cm from the nipple.  Ultrasound of this area showed a hypoechoic ill-defined mass, measuring 2.2 x 1.3 x 1.4 cm.  Physical exam of the left breast showed general vague thickening, upper outer quadrant of the left  breast with a discrete palpable mass. The ultrasound performed showed a single hypoechoic ill-defined nodule at the 1 o'clock position, measuring 7 x 5 x 6 mm.    She had biopsies of both lesions on 03/02/03.  Needle core biopsy of the lesion on the right breast revealed  invasive mammary carcinoma.  Needle core biopsy of the left breast showed a complex fibroadenoma.  Prognostic panel of the lesion on the right breast showed it to be ER positive at 73%, PR positive at 90% and proliferative index 9%, HER-2 was 1+.  Patient was referred to Barbara. Annamaria Parks, who performed a simple mastectomy with sentinel lymph node evaluation on 03/22/04.  Final pathology showed this to be an invasive ductal carcinoma  with lobular features, measuring 2.2 cm, grade 2 of 3.  Margins negative for carcinoma.  Invasive ductal carcinoma was extended to involve deep dermis of the nipple.  Lymphovascular invasion was identified.  Total of 4 sentinel lymph nodes were evaluated.  Touch imprints at the time of the OR was felt to be negative.  Subsequent evaluation showed a 5 mm focus of metastatic carcinoma in one of the four lymph nodes on microscopic after sectioning.  There was extracapsular extension  of one lymph node as well. The remaining three lymph nodes were all negative."  The patient's subsequent history is detailed above.   PAST MEDICAL HISTORY: Past Medical History:  Diagnosis Date   Arthritis    left hip   Asthma    breast ca 2005   breast/chemo R mastectomy   Dermatitis    Diabetes mellitus without complication ([place])    GERD (gastroesophageal reflux disease)    Hypercholesteremia    Metastasis to lung ([place]) dx'd 08/2011   Osteopenia due to cancer therapy 09/09/2013   Palpitations    Personal history of chemotherapy    Shortness of breath     PAST SURGICAL HISTORY: Past Surgical History:  Procedure Laterality Date   BREAST SURGERY  2005   right   CATARACT EXTRACTION W/PHACO Left 01/18/2014   Procedure: CATARACT EXTRACTION PHACO AND INTRAOCULAR LENS PLACEMENT ([place]);  Surgeon: Barbara Guadeloupe T. Gershon Crane, MD;  Location: [facility];  Service: Ophthalmology;  Laterality: Left;  CDE 15.79   CATARACT EXTRACTION W/PHACO Right 02/08/2014   Procedure: CATARACT EXTRACTION PHACO AND INTRAOCULAR LENS  PLACEMENT ([facility]);  Surgeon: Barbara Guadeloupe T. Gershon Crane, MD;  Location: [facility];  Service: Ophthalmology;  Laterality: Right;  CDE 4.41   CHEST TUBE INSERTION  09/11/2011   Procedure: INSERTION PLEURAL DRAINAGE CATHETER;  Surgeon: Barbara Pollack, MD;  Location: [facility];  Service: Thoracic;  Laterality: Right;   MASTECTOMY Right    PERICARDIAL WINDOW  09/11/2011   Procedure: PERICARDIAL WINDOW;  Surgeon: Barbara Pollack, MD;  Location: [facility];  Service: Thoracic;  Laterality: N/A;   REMOVAL OF PLEURAL DRAINAGE CATHETER  12/19/2011   Procedure: REMOVAL OF PLEURAL DRAINAGE CATHETER;  Surgeon: Barbara Pollack, MD;  Location: [facility];  Service: Thoracic;  Laterality: Right;  TO BE DONE IN MINOR ROOM, [place] Left 06/07/2015   Procedure: LEFT TOTAL HIP ARTHROPLASTY ANTERIOR APPROACH;  Surgeon: Barbara Arabian, MD;  Location: [facility];  Service: Orthopedics;  Laterality: Left;   VIDEO BRONCHOSCOPY  09/11/2011   Procedure: VIDEO BRONCHOSCOPY;  Surgeon: Barbara Pollack, MD;  Location: [facility];  Service: Thoracic;  Laterality: N/A;    FAMILY HISTORY Family History  Problem Relation Age of Onset  Cancer Mother        breast   Heart disease Father    Diabetes Other    Anesthesia problems Neg Hx   The patient's mother was diagnosed with breast cancer at age 95, she died age 17 from congestive heart failure.The patient's father died from heart disease at age 62.  She has one sister alive & well.  Two brothers alive & well, one with diabetes. The patient's sister was also diagnosed with breast cancer, and was tested for the BRCA gene, and was negative. The patient herself has not been tested. There is no history of ovarian cancer in the family   GYNECOLOGIC HISTORY:  No LMP recorded. Patient is postmenopausal. Menarche age 24, the patient is GX P0. She stopped having periods with chemotherapy in 2005. 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 She was a Archivist. She is single, lives alone with her Shitzu-poodle Sammie.. Family is all in the [place] area.     ADVANCED DIRECTIVES: Not in place. At the 02/23/2014 visit the patient was given the appropriate documents to complete and notarize at her discretion. She tells me she is planning to name her sister, Billie Ruddy, as healthcare power of attorney. [name] can be reached at [phone]   HEALTH MAINTENANCE: Social History   Tobacco Use   Smoking status: Former    Packs/day: 1.50    Years: 30.00    Pack years: 45.00    Types: Cigarettes    Quit date: 09/10/2007    Years since quitting: 13.5   Smokeless tobacco: Never  Substance Use Topics   Alcohol use: No   Drug use: No    Colonoscopy:  PAP:  Bone density: 04/09/2018, T score -1.8  Lipid panel:  Allergies  Allergen Reactions   Aspirin     REACTION: upset stomach   Latex Other (See Comments)    Blistering and skin peels off   Nsaids Nausea And Vomiting    Extreme nausea and vomiting    Current Outpatient Medications  Medication Sig Dispense Refill   cholecalciferol (VITAMIN D3) 25 MCG (1000 UT) tablet Take 2 tablets (2,000 Units total) by mouth daily. 180 tablet 4   cyclobenzaprine (FLEXERIL) 10 MG tablet Take 1 tablet (10 mg total) by mouth 2 (two) times daily as needed for muscle spasms. 20 tablet 0   gemfibrozil (LOPID) 600 MG tablet TAKE 1 TABLET (600 MG TOTAL) BY MOUTH 2 (TWO) TIMES DAILY BEFORE A MEAL. 180 tablet 1   loperamide (IMODIUM) 2 MG capsule Take 2 mg by mouth 4 (four) times daily as needed for diarrhea or loose stools. Reported on 11/30/2015     metFORMIN (GLUCOPHAGE-XR) 500 MG 24 hr tablet Take 1 tablet (500 mg total) by mouth 3 (three) times daily before meals.  3   oxyCODONE-acetaminophen (PERCOCET/ROXICET) 5-325 MG tablet Take 1 tablet by mouth at bedtime as needed for severe Parks. 30 tablet 0    prochlorperazine (COMPAZINE) 10 MG tablet Take 1 tablet (10 mg total) by mouth every 8 (eight) hours as needed for nausea or vomiting. 30 tablet 0   ribociclib succ (KISQALI 600MG DAILY DOSE) 200 MG Therapy Pack TAKE 3 TABLETS (600 MG TOTAL) BY MOUTH DAILY. TAKE FOR 21 DAYS ON, 7 DAYS OFF, REPEAT EVERY 28 DAYS. START 08/23/2020 63 each 0   traMADol (ULTRAM) 50 MG tablet Take 0.5-1  tablets (25-50 mg total) by mouth every 6 (six) hours as needed. 30 tablet 0   No current facility-administered medications for this visit.    OBJECTIVE: White woman who appears younger than stated age 47:   04/05/21 1155  BP: 117/69  Pulse: (!) 116  Resp: 18  Temp: (!) 97.5 F (36.4 C)  SpO2: 98%   Wt Readings from Last 3 Encounters:  04/05/21 148 lb 12.8 oz (67.5 kg)  03/06/21 151 lb (68.5 kg)  02/06/21 150 lb 12.8 oz (68.4 kg)   Body mass index is 25.54 kg/m.    ECOG FS:1 - Symptomatic but completely ambulatory  GENERAL: Patient is a well appearing female in no acute distress HEENT:  Sclerae anicteric.  Oropharynx clear and moist. No ulcerations or evidence of oropharyngeal candidiasis. Neck is supple.  NODES:  No cervical, supraclavicular, or axillary lymphadenopathy palpated.  BREAST EXAM:  Deferred. LUNGS:  Clear to auscultation bilaterally.  No wheezes or rhonchi. HEART:  Regular rate and rhythm. No murmur appreciated. ABDOMEN:  Soft, nontender.  Positive, normoactive bowel sounds. No organomegaly palpated. MSK:  No focal spinal tenderness to palpation. Full range of motion bilaterally in the upper extremities. EXTREMITIES:  No peripheral edema.   SKIN:  Clear with no obvious rashes or skin changes. No nail dyscrasia. NEURO:  Nonfocal. Well oriented.  Appropriate affect.   LAB  RESULTS:  CMP     Component Value Date/Time   NA 139 04/05/2021 1141   NA 139 07/08/2017 0942   K 4.5 04/05/2021 1141   K 3.8 07/08/2017 0942   CL 107 04/05/2021 1141   CL 109 (H) 12/31/2012 0940   CO2 20  (L) 04/05/2021 1141   CO2 18 (L) 07/08/2017 0942   GLUCOSE 130 (H) 04/05/2021 1141   GLUCOSE 156 (H) 07/08/2017 0942   GLUCOSE 127 (H) 12/31/2012 0940   BUN 22 04/05/2021 1141   BUN 17.7 07/08/2017 0942   CREATININE 1.31 (H) 04/05/2021 1141   CREATININE 1.18 (H) 12/25/2017 0959   CREATININE 1.0 07/08/2017 0942   CALCIUM 10.1 04/05/2021 1141   CALCIUM 10.4 07/08/2017 0942   PROT 7.9 04/05/2021 1141   PROT 7.7 07/08/2017 0942   ALBUMIN 3.7 04/05/2021 1141   ALBUMIN 3.4 (L) 07/08/2017 0942   AST 13 (L) 04/05/2021 1141   AST 50 (H) 12/25/2017 0959   AST 54 (H) 07/08/2017 0942   ALT 7 04/05/2021 1141   ALT 53 12/25/2017 0959   ALT 39 07/08/2017 0942   ALKPHOS 110 04/05/2021 1141   ALKPHOS 232 (H) 07/08/2017 0942   BILITOT 0.3 04/05/2021 1141   BILITOT 0.3 12/25/2017 0959   BILITOT 0.42 07/08/2017 0942   GFRNONAA 44 (L) 04/05/2021 1141   GFRNONAA 47 (L) 12/25/2017 0959   GFRAA 49 (L) 04/05/2020 1504   GFRAA 54 (L) 12/25/2017 0959    I No results found for: SPEP  Lab Results  Component Value Date   WBC 4.6 04/05/2021   NEUTROABS 2.9 04/05/2021   HGB 14.3 04/05/2021   HCT 42.3 04/05/2021   MCV 103.2 (H) 04/05/2021   PLT 175 04/05/2021      Chemistry      Component Value Date/Time   NA 139 04/05/2021 1141   NA 139 07/08/2017 0942   K 4.5 04/05/2021 1141   K 3.8 07/08/2017 0942   CL 107 04/05/2021 1141   CL 109 (H) 12/31/2012 0940   CO2 20 (L) 04/05/2021 1141   CO2 18 (L) 07/08/2017 1610  BUN 22 04/05/2021 1141   BUN 17.7 07/08/2017 0942   CREATININE 1.31 (H) 04/05/2021 1141   CREATININE 1.18 (H) 12/25/2017 0959   CREATININE 1.0 07/08/2017 0942      Component Value Date/Time   CALCIUM 10.1 04/05/2021 1141   CALCIUM 10.4 07/08/2017 0942   ALKPHOS 110 04/05/2021 1141   ALKPHOS 232 (H) 07/08/2017 0942   AST 13 (L) 04/05/2021 1141   AST 50 (H) 12/25/2017 0959   AST 54 (H) 07/08/2017 0942   ALT 7 04/05/2021 1141   ALT 53 12/25/2017 0959   ALT 39 07/08/2017  0942   BILITOT 0.3 04/05/2021 1141   BILITOT 0.3 12/25/2017 0959   BILITOT 0.42 07/08/2017 0942       Lab Results  Component Value Date   LABCA2 58 (H) 09/14/2012    No components found for: [id number]  No results for input(s): INR in the last 168 hours.  Urinalysis    Component Value Date/Time   COLORURINE YELLOW 11/25/2017 0901   APPEARANCEUR HAZY (A) 11/25/2017 0901   LABSPEC 1.017 11/25/2017 0901   LABSPEC 1.030 07/13/2015 0841   PHURINE 5.0 11/25/2017 0901   GLUCOSEU NEGATIVE 11/25/2017 0901   GLUCOSEU Negative 07/13/2015 0841   HGBUR MODERATE (A) 11/25/2017 0901   BILIRUBINUR NEGATIVE 11/25/2017 0901   BILIRUBINUR Negative 07/13/2015 0841   KETONESUR NEGATIVE 11/25/2017 0901   PROTEINUR 100 (A) 11/25/2017 0901   UROBILINOGEN 0.2 07/13/2015 0841   NITRITE NEGATIVE 11/25/2017 0901   LEUKOCYTESUR NEGATIVE 11/25/2017 0901   LEUKOCYTESUR Negative 07/13/2015 0841    STUDIES: No results found.    ASSESSMENT: 70 y.o. Barbara Parks, Barbara Parks woman with stage IV breast cancer, on BOLERO-4 trial  (1) status post right mastectomy and sentinel lymph node sampling 03/22/2004 for a right upper-outer quadrant pT2 pN1, stage IIB invasive ductal carcinoma with lobular features, grade 2, estrogen receptor and progesterone receptor positive, HER-2 negative, with an MIB-1 of 9% ([id number] and [id number])  (2) addition all right axillary lymph node sampling 05/07/2004 showed 2 benign lymph nodes (4 lymph nodes previously removed, so total was one positive lymph node out of 6; [id number])  (3) the patient was evaluated by radiation oncology; no postmastectomy radiation recommended  (4) adjuvant chemotherapy with dose dense doxorubicin and cyclophosphamide x4 cycles (first cycle delayed one week) followed by dose dense paclitaxel x4 was completed 09/18/2004  (5) tamoxifen started March 2006, discontinued 2009  METASTATIC DISEASE: February 2013 (6) presenting with a large pericardial effusion, large  right pleural effusion and possible right middle lobe bronchial obstruction February 2013, status post pericardial window placement, fiberoptic bronchoscopy and right Pleurx placement 09/11/2011, with biopsy of the bronchus intermedius and pericardium positive for metastatic breast cancer, estrogen receptor 91% positive with moderate staining intensity, progesterone receptor 100% positive with strong staining intensity, with an MIB-1 of 35%, and no HER-2 amplification, the signals ratio being 1.37 (SZA [id number])  (7) enrolled in BOLERO-4 trial 10/10/2011, receiving letrozole and everolimus  (a) two Parks areas of enhancement in the cerebellum noted by brain MRI 10/03/2011 were no longer apparent on repeat MRI 08/21/2012-- most recent brain MRI 04/01/2014 showed no evidence of intracranial metastatic disease  (b) sclerotic lesions in left iliac bone and sacrum have not been biopsied; stable; to start zolendronate after patient updates her dental care (extraction planned)--never started  (c) RLL lung nodule, stable (rescanned 07/12/2014 and 08/11/2014) R hilar and subcarinal lymph nodes: stable  (d) CT of the chest:03/25/2018, stable  (e) CT Angio 12/12/2018 stable  (f)  off BOLERO trial (trial ended) as of July 2020, but continuing on study drugs  (g) letrozole and everolimus discontinued January 2021 with disease progression  (8) additional problems:  (a) hepatic steatosis  (b) COPD/ emphysema/ asthma  (c) advanced L hip osteoarthritis, status post left total hip replacement 06/07/2015  (d) aortoiliac atherosclerosis  (e) dental evaluation pending w possible dental extractions  (f) likely thalassemia trait  (g) hyperlipidemia  (9) Bone density concerns: DEXA scan 08/11/2013 was normal  (a) repeat DEXA scan 04/09/2018 shows a T score of -1.8, osteopenia  (10) disease monitoring:  (a) PET scan 12/22/2018 shows no hypermetabolic soft tissue metastases but multifocal bony metastasis  (b)  denosumab/Xgeva started on 02/25/2019, repeated every 28 days (delayed due to dental extraction healing issues)  (c) bone scan 05/13/2019 serves as baseline study with increased activity in the right scapular, lower ribs and right femoral head (stable compared to PET scan of May 2020  (d) bone scan 07/09/2019 stable  (e) CT chest 07/09/2019 shows stable lung lesions but emerging left liver lobe 0.7 cm lesion  (f) liver MRI 08/03/2019 shows approximately 8 liver lesions, the largest measuring 1.8 cm.  (g) liver biopsy 08/06/2019 confirms metastatic breast cancer, estrogen and progesterone receptor positive, with an MIB-1 of 5% and HER-2 not amplified  (h) chest CT and bone scan 11/11/2019 essentially stable  (i) liver MRI 02/01/2020 stable to improved  (j) chest CT and bone scan 04/26/2020 stable  (Continued under #12)  (11) denosumab/Xgeva begun 02/25/2019 repeated every 4 weeks  (12) fulvestrant started 08/24/2019, repeated every 4 weeks  (a) palbociclib 125 mg daily 21 days on 7 days off started 08/25/2019  (b) chest CT 07/24/2020 stable  (c) liver MRI 07/25/2020 shows mild progression in measurable disease  (d) palbociclib discontinued and ribociclib substituted starting 08/22/2020   (i) QTc on 08/22/2020 was 430   (ii) QTc on 10/17/2020 was 441  (e) liver MRI 04/1 02/22/2021 shows stable disease   (f) liver MRI 02/2021 shows stable disease  (g) bone scan 02/21/2021 shows no significant change  (13) foundation 1 requested on liver biopsy from 08/16/2019 shows a stable microsatellite status with 1 mutation/MB, amplification of C11orf30, FGF10 and RICTOR, with mutations in ESR 1 and PTEN  (14) palliative radiation (Barbara. Lynnette Caffey in [place]) 02/28/2021 to   PLAN: Izzah is here today to f/u on her metastatic breast cancer.  She continues on treatment with fulvestrant, ribociclib and Xgeva with good tolerance.  She will continue this.  She has no clinical signs of progression today.  Her labs  remain stable which I reviewed with her in detail.  She is due with restaging with CT chest next month.  I placed orders for this today.  We will see her back for labs, f/u and injections every 4 weeks.  She knows to call for any questions that may arise between now and her next appointment.  We are happy to see her sooner if needed.  Total encounter time: 20 minutes* in face to face visit time, chart review, lab review, order entry and documentation of the encounter.  Wilber Bihari, NP 04/07/21 2:38 PM Medical Oncology and Hematology [facility] [place], [place] [address] Tel. [phone]    Fax. [phone]  *Total Encounter Time as defined by the Centers for Medicare and Medicaid Services includes, in addition to the face-to-face time of a patient visit (documented in the note above) non-face-to-face time: obtaining and reviewing outside history, ordering and  reviewing medications, tests or procedures, care coordination (communications with other health care professionals or caregivers) and documentation in the medical record.

## 2021-04-05 NOTE — Patient Instructions (Signed)
Fulvestrant injection °What is this medication? °FULVESTRANT (ful VES trant) blocks the effects of estrogen. It is used to treat breast cancer. °This medicine may be used for other purposes; ask your health care provider or pharmacist if you have questions. °COMMON BRAND NAME(S): FASLODEX °What should I tell my care team before I take this medication? °They need to know if you have any of these conditions: °bleeding disorders °liver disease °low blood counts, like low white cell, platelet, or red cell counts °an unusual or allergic reaction to fulvestrant, other medicines, foods, dyes, or preservatives °pregnant or trying to get pregnant °breast-feeding °How should I use this medication? °This medicine is for injection into a muscle. It is usually given by a health care professional in a hospital or clinic setting. °Talk to your pediatrician regarding the use of this medicine in children. Special care may be needed. °Overdosage: If you think you have taken too much of this medicine contact a poison control center or emergency room at once. °NOTE: This medicine is only for you. Do not share this medicine with others. °What if I miss a dose? °It is important not to miss your dose. Call your doctor or health care professional if you are unable to keep an appointment. °What may interact with this medication? °medicines that treat or prevent blood clots like warfarin, enoxaparin, dalteparin, apixaban, dabigatran, and rivaroxaban °This list may not describe all possible interactions. Give your health care provider a list of all the medicines, herbs, non-prescription drugs, or dietary supplements you use. Also tell them if you smoke, drink alcohol, or use illegal drugs. Some items may interact with your medicine. °What should I watch for while using this medication? °Your condition will be monitored carefully while you are receiving this medicine. You will need important blood work done while you are taking this  medicine. °Do not become pregnant while taking this medicine or for at least 1 year after stopping it. Women of child-bearing potential will need to have a negative pregnancy test before starting this medicine. Women should inform their doctor if they wish to become pregnant or think they might be pregnant. There is a potential for serious side effects to an unborn child. Men should inform their doctors if they wish to father a child. This medicine may lower sperm counts. Talk to your health care professional or pharmacist for more information. Do not breast-feed an infant while taking this medicine or for 1 year after the last dose. °What side effects may I notice from receiving this medication? °Side effects that you should report to your doctor or health care professional as soon as possible: °allergic reactions like skin rash, itching or hives, swelling of the face, lips, or tongue °feeling faint or lightheaded, falls °pain, tingling, numbness, or weakness in the legs °signs and symptoms of infection like fever or chills; cough; flu-like symptoms; sore throat °vaginal bleeding °Side effects that usually do not require medical attention (report to your doctor or health care professional if they continue or are bothersome): °aches, pains °constipation °diarrhea °headache °hot flashes °nausea, vomiting °pain at site where injected °stomach pain °This list may not describe all possible side effects. Call your doctor for medical advice about side effects. You may report side effects to FDA at 1-800-FDA-1088. °Where should I keep my medication? °This drug is given in a hospital or clinic and will not be stored at home. °NOTE: This sheet is a summary. It may not cover all possible information. If you have   questions about this medicine, talk to your doctor, pharmacist, or health care provider. ° °Denosumab injection °What is this medication? °DENOSUMAB (den oh sue mab) slows bone breakdown. Prolia is used to treat  osteoporosis in women after menopause and in men, and in people who are taking corticosteroids for 6 months or more. Xgeva is used to treat a high calcium level due to cancer and to prevent bone fractures and other bone problems caused by multiple myeloma or cancer bone metastases. Xgeva is also used to treat giant cell tumor of the bone. °This medicine may be used for other purposes; ask your health care provider or pharmacist if you have questions. °COMMON BRAND NAME(S): Prolia, XGEVA °What should I tell my care team before I take this medication? °They need to know if you have any of these conditions: °dental disease °having surgery or tooth extraction °infection °kidney disease °low levels of calcium or Vitamin D in the blood °malnutrition °on hemodialysis °skin conditions or sensitivity °thyroid or parathyroid disease °an unusual reaction to denosumab, other medicines, foods, dyes, or preservatives °pregnant or trying to get pregnant °breast-feeding °How should I use this medication? °This medicine is for injection under the skin. It is given by a health care professional in a hospital or clinic setting. °A special MedGuide will be given to you before each treatment. Be sure to read this information carefully each time. °For Prolia, talk to your pediatrician regarding the use of this medicine in children. Special care may be needed. For Xgeva, talk to your pediatrician regarding the use of this medicine in children. While this drug may be prescribed for children as young as 13 years for selected conditions, precautions do apply. °Overdosage: If you think you have taken too much of this medicine contact a poison control center or emergency room at once. °NOTE: This medicine is only for you. Do not share this medicine with others. °What if I miss a dose? °It is important not to miss your dose. Call your doctor or health care professional if you are unable to keep an appointment. °What may interact with this  medication? °Do not take this medicine with any of the following medications: °other medicines containing denosumab °This medicine may also interact with the following medications: °medicines that lower your chance of fighting infection °steroid medicines like prednisone or cortisone °This list may not describe all possible interactions. Give your health care provider a list of all the medicines, herbs, non-prescription drugs, or dietary supplements you use. Also tell them if you smoke, drink alcohol, or use illegal drugs. Some items may interact with your medicine. °What should I watch for while using this medication? °Visit your doctor or health care professional for regular checks on your progress. Your doctor or health care professional may order blood tests and other tests to see how you are doing. °Call your doctor or health care professional for advice if you get a fever, chills or sore throat, or other symptoms of a cold or flu. Do not treat yourself. This drug may decrease your body's ability to fight infection. Try to avoid being around people who are sick. °You should make sure you get enough calcium and vitamin D while you are taking this medicine, unless your doctor tells you not to. Discuss the foods you eat and the vitamins you take with your health care professional. °See your dentist regularly. Brush and floss your teeth as directed. Before you have any dental work done, tell your dentist you are receiving   medicine. Do not become pregnant while taking this medicine or for 5 months after stopping it. Talk with your doctor or health care professional about your birth control options while taking this medicine. Women should inform their doctor if they wish to become pregnant or think they might be pregnant. There is a potential for serious side effects to an unborn child. Talk to your health care professional or pharmacist for more information. What side effects may I notice from receiving this  medication? Side effects that you should report to your doctor or health care professional as soon as possible: allergic reactions like skin rash, itching or hives, swelling of the face, lips, or tongue bone pain breathing problems dizziness jaw pain, especially after dental work redness, blistering, peeling of the skin signs and symptoms of infection like fever or chills; cough; sore throat; pain or trouble passing urine signs of low calcium like fast heartbeat, muscle cramps or muscle pain; pain, tingling, numbness in the hands or feet; seizures unusual bleeding or bruising unusually weak or tired Side effects that usually do not require medical attention (report to your doctor or health care professional if they continue or are bothersome): constipation diarrhea headache joint pain loss of appetite muscle pain runny nose tiredness upset stomach This list may not describe all possible side effects. Call your doctor for medical advice about side effects. You may report side effects to FDA at 1-800-FDA-1088. Where should I keep my medication? This medicine is only given in a clinic, doctor's office, or other health care setting and will not be stored at home. NOTE: This sheet is a summary. It may not cover all possible information. If you have questions about this medicine, talk to your doctor, pharmacist, or health care provider.  2022 [employer] (2017-11-21 16:10:44)   2022 [employer] (2017-10-23 11:34:41)

## 2021-04-07 ENCOUNTER — Encounter: Payer: Self-pay | Admitting: Adult Health

## 2021-04-07 ENCOUNTER — Encounter: Payer: Self-pay | Admitting: Oncology

## 2021-04-17 DIAGNOSIS — Z803 Family history of malignant neoplasm of breast: Secondary | ICD-10-CM | POA: Diagnosis not present

## 2021-04-17 DIAGNOSIS — Z170 Estrogen receptor positive status [ER+]: Secondary | ICD-10-CM | POA: Diagnosis not present

## 2021-04-17 DIAGNOSIS — Z87891 Personal history of nicotine dependence: Secondary | ICD-10-CM | POA: Diagnosis not present

## 2021-04-17 DIAGNOSIS — E7800 Pure hypercholesterolemia, unspecified: Secondary | ICD-10-CM | POA: Diagnosis not present

## 2021-04-17 DIAGNOSIS — Z79818 Long term (current) use of other agents affecting estrogen receptors and estrogen levels: Secondary | ICD-10-CM | POA: Diagnosis not present

## 2021-04-17 DIAGNOSIS — Z9011 Acquired absence of right breast and nipple: Secondary | ICD-10-CM | POA: Diagnosis not present

## 2021-04-17 DIAGNOSIS — C50911 Malignant neoplasm of unspecified site of right female breast: Secondary | ICD-10-CM | POA: Diagnosis not present

## 2021-04-17 DIAGNOSIS — C7951 Secondary malignant neoplasm of bone: Secondary | ICD-10-CM | POA: Diagnosis not present

## 2021-04-17 DIAGNOSIS — E119 Type 2 diabetes mellitus without complications: Secondary | ICD-10-CM | POA: Diagnosis not present

## 2021-04-27 ENCOUNTER — Other Ambulatory Visit: Payer: Self-pay | Admitting: *Deleted

## 2021-04-27 DIAGNOSIS — C50911 Malignant neoplasm of unspecified site of right female breast: Secondary | ICD-10-CM

## 2021-04-27 MED ORDER — RIBOCICLIB SUCC (600 MG DOSE) 200 MG PO TBPK: ORAL_TABLET | ORAL | 0 refills | Status: DC

## 2021-05-03 ENCOUNTER — Inpatient Hospital Stay: Payer: Medicare Other

## 2021-05-03 ENCOUNTER — Inpatient Hospital Stay: Payer: Medicare Other | Attending: Oncology

## 2021-05-03 ENCOUNTER — Other Ambulatory Visit: Payer: Self-pay

## 2021-05-03 VITALS — BP 152/85 | HR 113 | Temp 98.2°F | Resp 18

## 2021-05-03 DIAGNOSIS — Z79899 Other long term (current) drug therapy: Secondary | ICD-10-CM | POA: Diagnosis not present

## 2021-05-03 DIAGNOSIS — C787 Secondary malignant neoplasm of liver and intrahepatic bile duct: Secondary | ICD-10-CM

## 2021-05-03 DIAGNOSIS — C50911 Malignant neoplasm of unspecified site of right female breast: Secondary | ICD-10-CM

## 2021-05-03 DIAGNOSIS — C7951 Secondary malignant neoplasm of bone: Secondary | ICD-10-CM | POA: Insufficient documentation

## 2021-05-03 DIAGNOSIS — C50411 Malignant neoplasm of upper-outer quadrant of right female breast: Secondary | ICD-10-CM | POA: Insufficient documentation

## 2021-05-03 DIAGNOSIS — C7800 Secondary malignant neoplasm of unspecified lung: Secondary | ICD-10-CM

## 2021-05-03 DIAGNOSIS — I3131 Malignant pericardial effusion in diseases classified elsewhere: Secondary | ICD-10-CM

## 2021-05-03 DIAGNOSIS — Z5111 Encounter for antineoplastic chemotherapy: Secondary | ICD-10-CM | POA: Diagnosis not present

## 2021-05-03 DIAGNOSIS — Z170 Estrogen receptor positive status [ER+]: Secondary | ICD-10-CM

## 2021-05-03 LAB — CBC WITH DIFFERENTIAL/PLATELET
Abs Immature Granulocytes: 0.03 10*3/uL (ref 0.00–0.07)
Basophils Absolute: 0.1 10*3/uL (ref 0.0–0.1)
Basophils Relative: 2 %
Eosinophils Absolute: 0 10*3/uL (ref 0.0–0.5)
Eosinophils Relative: 1 %
HCT: 41.3 % (ref 36.0–46.0)
Hemoglobin: 13.8 g/dL (ref 12.0–15.0)
Immature Granulocytes: 1 %
Lymphocytes Relative: 10 %
Lymphs Abs: 0.5 10*3/uL — ABNORMAL LOW (ref 0.7–4.0)
MCH: 34.8 pg — ABNORMAL HIGH (ref 26.0–34.0)
MCHC: 33.4 g/dL (ref 30.0–36.0)
MCV: 104 fL — ABNORMAL HIGH (ref 80.0–100.0)
Monocytes Absolute: 0.6 10*3/uL (ref 0.1–1.0)
Monocytes Relative: 12 %
Neutro Abs: 3.7 10*3/uL (ref 1.7–7.7)
Neutrophils Relative %: 74 %
Platelets: 194 10*3/uL (ref 150–400)
RBC: 3.97 MIL/uL (ref 3.87–5.11)
RDW: 15 % (ref 11.5–15.5)
WBC: 5 10*3/uL (ref 4.0–10.5)
nRBC: 0 % (ref 0.0–0.2)

## 2021-05-03 LAB — COMPREHENSIVE METABOLIC PANEL
ALT: 8 U/L (ref 0–44)
AST: 13 U/L — ABNORMAL LOW (ref 15–41)
Albumin: 3.6 g/dL (ref 3.5–5.0)
Alkaline Phosphatase: 104 U/L (ref 38–126)
Anion gap: 10 (ref 5–15)
BUN: 18 mg/dL (ref 8–23)
CO2: 20 mmol/L — ABNORMAL LOW (ref 22–32)
Calcium: 9.6 mg/dL (ref 8.9–10.3)
Chloride: 107 mmol/L (ref 98–111)
Creatinine, Ser: 1.31 mg/dL — ABNORMAL HIGH (ref 0.44–1.00)
GFR, Estimated: 44 mL/min — ABNORMAL LOW (ref >60)
Glucose, Bld: 232 mg/dL — ABNORMAL HIGH (ref 70–99)
Potassium: 4.5 mmol/L (ref 3.5–5.1)
Sodium: 137 mmol/L (ref 135–145)
Total Bilirubin: 0.3 mg/dL (ref 0.3–1.2)
Total Protein: 7.6 g/dL (ref 6.5–8.1)

## 2021-05-03 MED ORDER — DENOSUMAB 120 MG/1.7ML ~~LOC~~ SOLN
120.0000 mg | Freq: Once | SUBCUTANEOUS | Status: AC
Start: 2021-05-03 — End: 2021-05-03
  Administered 2021-05-03: 120 mg via SUBCUTANEOUS
  Filled 2021-05-03: qty 1.7

## 2021-05-03 MED ORDER — FULVESTRANT 250 MG/5ML IM SOLN
500.0000 mg | Freq: Once | INTRAMUSCULAR | Status: AC
  Administered 2021-05-03: 500 mg via INTRAMUSCULAR
  Filled 2021-05-03: qty 10

## 2021-05-03 NOTE — Patient Instructions (Signed)
Fulvestrant injection °What is this medication? °FULVESTRANT (ful VES trant) blocks the effects of estrogen. It is used to treat breast cancer. °This medicine may be used for other purposes; ask your health care provider or pharmacist if you have questions. °COMMON BRAND NAME(S): FASLODEX °What should I tell my care team before I take this medication? °They need to know if you have any of these conditions: °bleeding disorders °liver disease °low blood counts, like low white cell, platelet, or red cell counts °an unusual or allergic reaction to fulvestrant, other medicines, foods, dyes, or preservatives °pregnant or trying to get pregnant °breast-feeding °How should I use this medication? °This medicine is for injection into a muscle. It is usually given by a health care professional in a hospital or clinic setting. °Talk to your pediatrician regarding the use of this medicine in children. Special care may be needed. °Overdosage: If you think you have taken too much of this medicine contact a poison control center or emergency room at once. °NOTE: This medicine is only for you. Do not share this medicine with others. °What if I miss a dose? °It is important not to miss your dose. Call your doctor or health care professional if you are unable to keep an appointment. °What may interact with this medication? °medicines that treat or prevent blood clots like warfarin, enoxaparin, dalteparin, apixaban, dabigatran, and rivaroxaban °This list may not describe all possible interactions. Give your health care provider a list of all the medicines, herbs, non-prescription drugs, or dietary supplements you use. Also tell them if you smoke, drink alcohol, or use illegal drugs. Some items may interact with your medicine. °What should I watch for while using this medication? °Your condition will be monitored carefully while you are receiving this medicine. You will need important blood work done while you are taking this  medicine. °Do not become pregnant while taking this medicine or for at least 1 year after stopping it. Women of child-bearing potential will need to have a negative pregnancy test before starting this medicine. Women should inform their doctor if they wish to become pregnant or think they might be pregnant. There is a potential for serious side effects to an unborn child. Men should inform their doctors if they wish to father a child. This medicine may lower sperm counts. Talk to your health care professional or pharmacist for more information. Do not breast-feed an infant while taking this medicine or for 1 year after the last dose. °What side effects may I notice from receiving this medication? °Side effects that you should report to your doctor or health care professional as soon as possible: °allergic reactions like skin rash, itching or hives, swelling of the face, lips, or tongue °feeling faint or lightheaded, falls °pain, tingling, numbness, or weakness in the legs °signs and symptoms of infection like fever or chills; cough; flu-like symptoms; sore throat °vaginal bleeding °Side effects that usually do not require medical attention (report to your doctor or health care professional if they continue or are bothersome): °aches, pains °constipation °diarrhea °headache °hot flashes °nausea, vomiting °pain at site where injected °stomach pain °This list may not describe all possible side effects. Call your doctor for medical advice about side effects. You may report side effects to FDA at 1-800-FDA-1088. °Where should I keep my medication? °This drug is given in a hospital or clinic and will not be stored at home. °NOTE: This sheet is a summary. It may not cover all possible information. If you have   questions about this medicine, talk to your doctor, pharmacist, or health care provider. ° °Denosumab injection °What is this medication? °DENOSUMAB (den oh sue mab) slows bone breakdown. Prolia is used to treat  osteoporosis in women after menopause and in men, and in people who are taking corticosteroids for 6 months or more. Xgeva is used to treat a high calcium level due to cancer and to prevent bone fractures and other bone problems caused by multiple myeloma or cancer bone metastases. Xgeva is also used to treat giant cell tumor of the bone. °This medicine may be used for other purposes; ask your health care provider or pharmacist if you have questions. °COMMON BRAND NAME(S): Prolia, XGEVA °What should I tell my care team before I take this medication? °They need to know if you have any of these conditions: °dental disease °having surgery or tooth extraction °infection °kidney disease °low levels of calcium or Vitamin D in the blood °malnutrition °on hemodialysis °skin conditions or sensitivity °thyroid or parathyroid disease °an unusual reaction to denosumab, other medicines, foods, dyes, or preservatives °pregnant or trying to get pregnant °breast-feeding °How should I use this medication? °This medicine is for injection under the skin. It is given by a health care professional in a hospital or clinic setting. °A special MedGuide will be given to you before each treatment. Be sure to read this information carefully each time. °For Prolia, talk to your pediatrician regarding the use of this medicine in children. Special care may be needed. For Xgeva, talk to your pediatrician regarding the use of this medicine in children. While this drug may be prescribed for children as young as 13 years for selected conditions, precautions do apply. °Overdosage: If you think you have taken too much of this medicine contact a poison control center or emergency room at once. °NOTE: This medicine is only for you. Do not share this medicine with others. °What if I miss a dose? °It is important not to miss your dose. Call your doctor or health care professional if you are unable to keep an appointment. °What may interact with this  medication? °Do not take this medicine with any of the following medications: °other medicines containing denosumab °This medicine may also interact with the following medications: °medicines that lower your chance of fighting infection °steroid medicines like prednisone or cortisone °This list may not describe all possible interactions. Give your health care provider a list of all the medicines, herbs, non-prescription drugs, or dietary supplements you use. Also tell them if you smoke, drink alcohol, or use illegal drugs. Some items may interact with your medicine. °What should I watch for while using this medication? °Visit your doctor or health care professional for regular checks on your progress. Your doctor or health care professional may order blood tests and other tests to see how you are doing. °Call your doctor or health care professional for advice if you get a fever, chills or sore throat, or other symptoms of a cold or flu. Do not treat yourself. This drug may decrease your body's ability to fight infection. Try to avoid being around people who are sick. °You should make sure you get enough calcium and vitamin D while you are taking this medicine, unless your doctor tells you not to. Discuss the foods you eat and the vitamins you take with your health care professional. °See your dentist regularly. Brush and floss your teeth as directed. Before you have any dental work done, tell your dentist you are receiving   medicine. Do not become pregnant while taking this medicine or for 5 months after stopping it. Talk with your doctor or health care professional about your birth control options while taking this medicine. Women should inform their doctor if they wish to become pregnant or think they might be pregnant. There is a potential for serious side effects to an unborn child. Talk to your health care professional or pharmacist for more information. What side effects may I notice from receiving this  medication? Side effects that you should report to your doctor or health care professional as soon as possible: allergic reactions like skin rash, itching or hives, swelling of the face, lips, or tongue bone pain breathing problems dizziness jaw pain, especially after dental work redness, blistering, peeling of the skin signs and symptoms of infection like fever or chills; cough; sore throat; pain or trouble passing urine signs of low calcium like fast heartbeat, muscle cramps or muscle pain; pain, tingling, numbness in the hands or feet; seizures unusual bleeding or bruising unusually weak or tired Side effects that usually do not require medical attention (report to your doctor or health care professional if they continue or are bothersome): constipation diarrhea headache joint pain loss of appetite muscle pain runny nose tiredness upset stomach This list may not describe all possible side effects. Call your doctor for medical advice about side effects. You may report side effects to FDA at 1-800-FDA-1088. Where should I keep my medication? This medicine is only given in a clinic, doctor's office, or other health care setting and will not be stored at home. NOTE: This sheet is a summary. It may not cover all possible information. If you have questions about this medicine, talk to your doctor, pharmacist, or health care provider.  2022 [employer] (2017-11-21 16:10:44)   2022 [employer] (2017-10-23 11:34:41)

## 2021-05-07 ENCOUNTER — Other Ambulatory Visit: Payer: Self-pay | Admitting: Oncology

## 2021-05-07 ENCOUNTER — Other Ambulatory Visit: Payer: Self-pay

## 2021-05-07 ENCOUNTER — Ambulatory Visit
Admission: RE | Admit: 2021-05-07 | Discharge: 2021-05-07 | Disposition: A | Discharge status: Home / Self Care | Payer: Medicare Other | Source: Ambulatory Visit | Attending: Oncology | Admitting: Oncology

## 2021-05-07 DIAGNOSIS — Z1231 Encounter for screening mammogram for malignant neoplasm of breast: Secondary | ICD-10-CM | POA: Diagnosis not present

## 2021-05-07 HISTORY — DX: Malignant neoplasm of unspecified site of unspecified female breast: C50.919

## 2021-05-10 ENCOUNTER — Other Ambulatory Visit: Payer: Self-pay | Admitting: *Deleted

## 2021-05-11 ENCOUNTER — Other Ambulatory Visit: Payer: Self-pay | Admitting: *Deleted

## 2021-05-11 ENCOUNTER — Other Ambulatory Visit: Payer: Self-pay | Admitting: Oncology

## 2021-05-11 DIAGNOSIS — C50911 Malignant neoplasm of unspecified site of right female breast: Secondary | ICD-10-CM

## 2021-05-11 DIAGNOSIS — R928 Other abnormal and inconclusive findings on diagnostic imaging of breast: Secondary | ICD-10-CM

## 2021-05-11 MED ORDER — RIBOCICLIB SUCC (600 MG DOSE) 200 MG PO TBPK: ORAL_TABLET | ORAL | 0 refills | Status: DC

## 2021-05-18 ENCOUNTER — Ambulatory Visit
Admission: RE | Admit: 2021-05-18 | Discharge: 2021-05-18 | Disposition: A | Discharge status: Home / Self Care | Payer: Medicare Other | Source: Ambulatory Visit | Attending: Oncology | Admitting: Oncology

## 2021-05-18 ENCOUNTER — Encounter: Payer: Self-pay | Admitting: Oncology

## 2021-05-18 ENCOUNTER — Other Ambulatory Visit: Payer: Self-pay

## 2021-05-18 DIAGNOSIS — R928 Other abnormal and inconclusive findings on diagnostic imaging of breast: Secondary | ICD-10-CM

## 2021-05-18 DIAGNOSIS — R590 Localized enlarged lymph nodes: Secondary | ICD-10-CM | POA: Diagnosis not present

## 2021-05-22 ENCOUNTER — Encounter: Payer: Self-pay | Admitting: Oncology

## 2021-05-24 IMAGING — MR MR ABDOMEN WO/W CM
18 series · 48 of 48 positions shown · IV contrast (with contrast)
Comparison: Multiple prior studies most recent 08/03/2019

CLINICAL DATA: Breast cancer staging

EXAM:
MRI ABDOMEN WITHOUT AND WITH CONTRAST
TECHNIQUE: Multiplanar multisequence MR imaging of the abdomen was performed
both before and after the administration of intravenous contrast.
CONTRAST:  6mL GADAVIST GADOBUTROL 1 MMOL/ML IV SOLN

[Series 2: haste_cor_mbh · coronal · 6.0mm · 1.41mm/px · 2 of 33 slices shown]
[im 1/33]
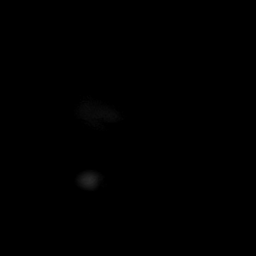
[im 33/33]
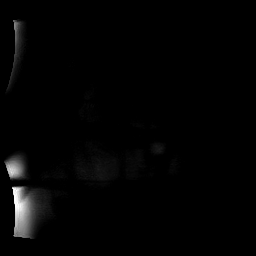

[Series 3: ax_trufi_mbh · axial · 6.0mm · 1.04mm/px · z∈[-177,+81]mm · 3 of 44 slices shown]
[im 1/44]
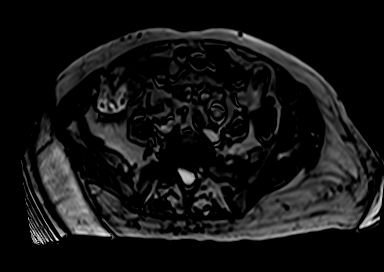
[im 22/44]
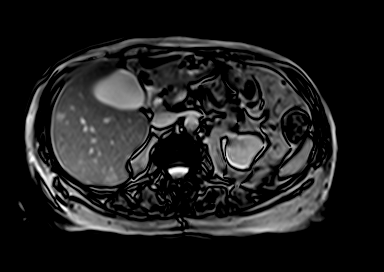
[im 44/44]
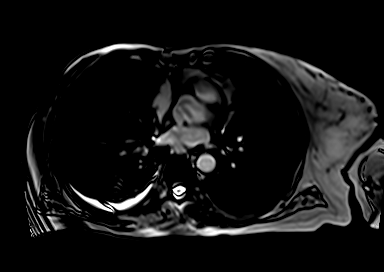

[Series 4: T2 fat-sat · axial · 6.0mm · 1.25mm/px · z∈[-168,+84]mm · 2 of 36 slices shown]
[im 1/36]
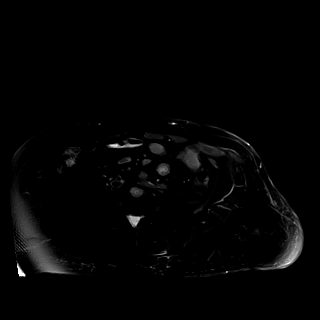
[im 36/36]
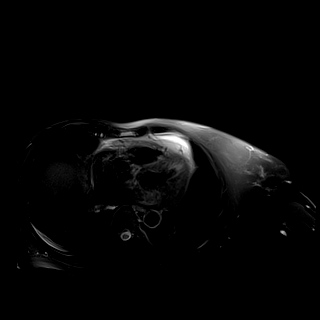

[Series 6: ax_diff_fb_tracew_dfc_mix · axial · 6.0mm · 1.49mm/px · z∈[-190,+105]mm · 3 of 84 slices shown]
[im 1/84]
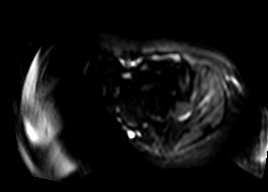
[im 42/84]
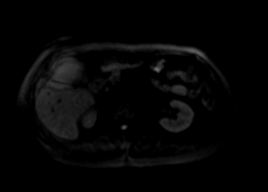
[im 84/84]
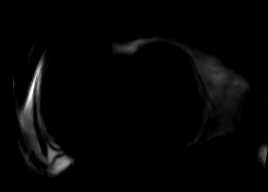

[Series 7: ax_diff_fb_adc_dfc_mix · axial · 6.0mm · 1.49mm/px · z∈[-190,+105]mm · 2 of 42 slices shown]
[im 1/42]
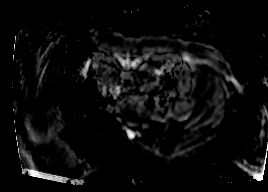
[im 42/42]
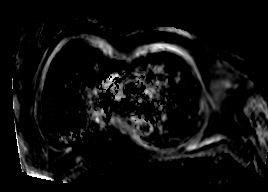

[Series 15: t1_vibe_e-dixon_tra_bh_pre_in_reg · axial · 3.0mm · 1.56mm/px · z∈[-161,+76]mm · 3 of 80 slices shown]
[im 1/80]
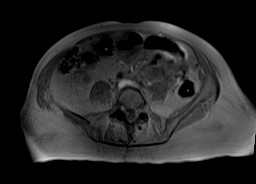
[im 40/80]
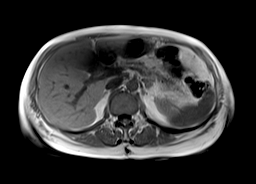
[im 80/80]
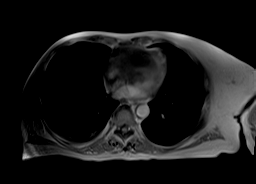

[Series 16: t1_vibe_e-dixon_tra_bh_pre_opp_reg · axial · 3.0mm · 1.56mm/px · z∈[-161,+76]mm · 3 of 80 slices shown]
[im 1/80]
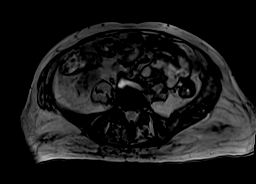
[im 40/80]
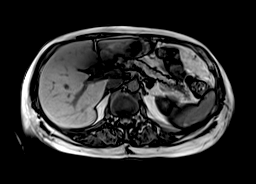
[im 80/80]
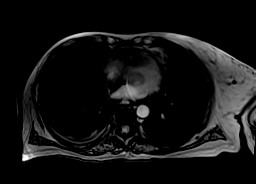

[Series 18: t1_vibe_e-dixon_tra_bh_pre_w_reg · axial · 3.0mm · 1.56mm/px · z∈[-161,+76]mm · 3 of 80 slices shown]
[im 1/80]
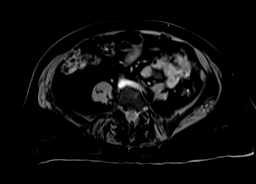
[im 40/80]
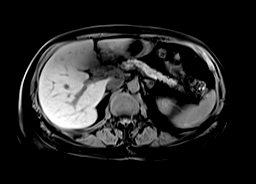
[im 80/80]
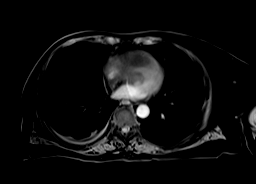

[Series 19: t1_vibe_dixon_tra_bh_arterial_w_reg · axial · 3.0mm · 1.56mm/px · z∈[-161,+76]mm · 3 of 80 slices shown]
[im 1/80]
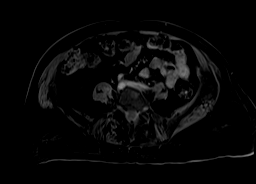
[im 40/80]
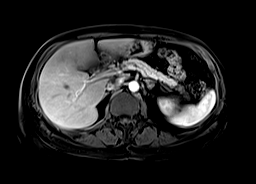
[im 80/80]
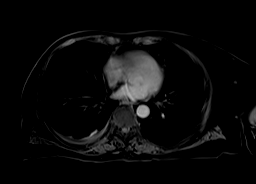

[Series 20: t1_vibe_dixon_tra_bh_arterial_w_sub · axial · 3.0mm · 1.56mm/px · z∈[-161,+76]mm · 3 of 80 slices shown]
[im 1/80]
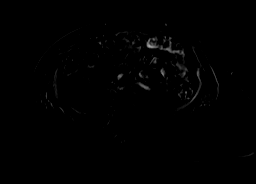
[im 40/80]
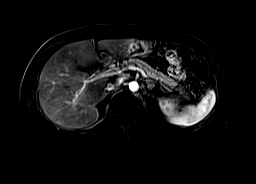
[im 80/80]
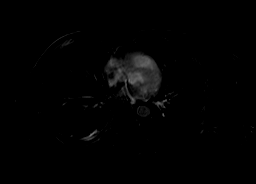

[Series 21: t1_vibe_dixon_tra_bh_venous_w_reg · axial · 3.0mm · 1.56mm/px · z∈[-161,+76]mm · 3 of 80 slices shown]
[im 1/80]
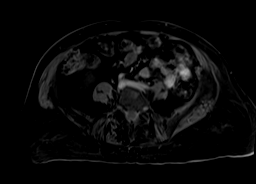
[im 40/80]
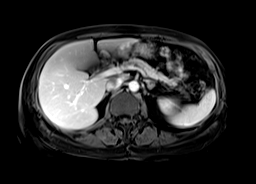
[im 80/80]
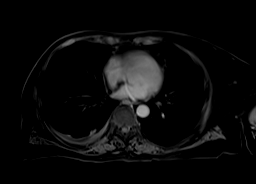

[Series 22: t1_vibe_dixon_tra_bh_venous_w_r_sub · axial · 3.0mm · 1.56mm/px · z∈[-161,+76]mm · 3 of 80 slices shown]
[im 1/80]
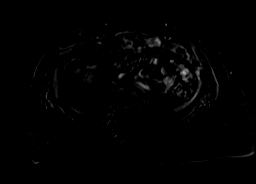
[im 40/80]
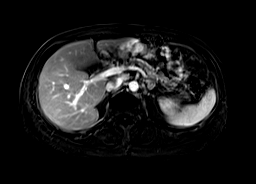
[im 80/80]
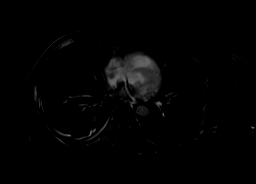

[Series 23: T2 · axial · 6.0mm · 1.48mm/px · 1 of 36 slices shown]
[im 1/36]
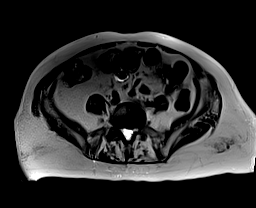

[Series 24: t1_vibe_dixon_tra_bh_delayed_w_reg · axial · 3.0mm · 1.56mm/px · z∈[-161,+76]mm · 3 of 80 slices shown]
[im 1/80]
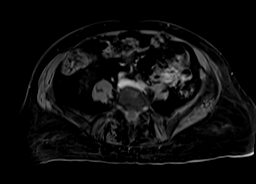
[im 40/80]
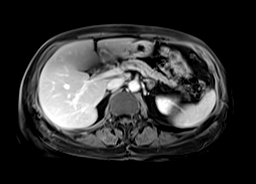
[im 80/80]
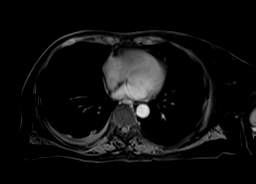

[Series 25: t1_vibe_dixon_tra_bh_delayed_w__sub · axial · 3.0mm · 1.56mm/px · z∈[-161,+76]mm · 3 of 80 slices shown]
[im 1/80]
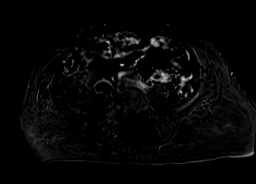
[im 40/80]
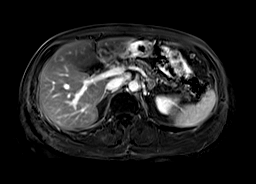
[im 80/80]
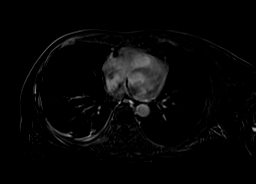

[Series 27: t1_vibe_dixon_cor_bh_post_w · coronal · 5.0mm · 1.70mm/px · 2 of 48 slices shown]
[im 1/48]
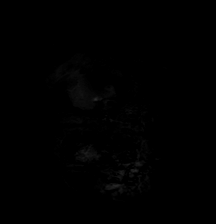
[im 48/48]
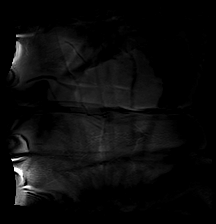

[Series 28: t1_vibe_dixon_tra_bh_3 min_w_reg · axial · 3.0mm · 1.56mm/px · z∈[-161,+76]mm · 3 of 80 slices shown]
[im 1/80]
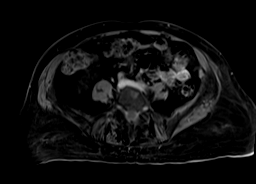
[im 40/80]
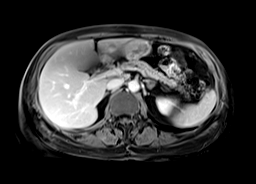
[im 80/80]
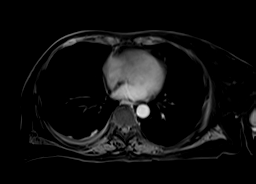

[Series 29: t1_vibe_dixon_tra_bh_3 min_w_re_sub · axial · 3.0mm · 1.56mm/px · z∈[-161,+76]mm · 3 of 80 slices shown]
[im 1/80]
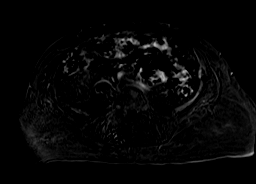
[im 40/80]
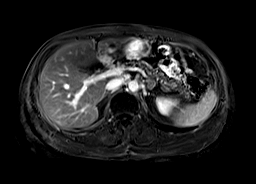
[im 80/80]
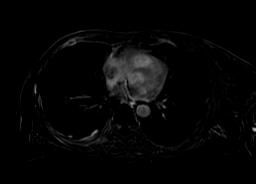

[48 of 48 positions shown; findings below may reference images not displayed]

FINDINGS: Lower chest: T10 vertebral lesion showing increased T2 signal even
on fat saturated images also likely with small adjacent T9 lesion.

Small right effusion, increased when compared to the prior study.
Right lower lobe pulmonary lesion partially obscured by volume loss
and adjacent effusion better demonstrated on previous chest CT.
Lung bases not well assessed on MRI.

Hepatobiliary: Stable mild dilation of the common bile duct in the
setting of periampullary duodenal diverticulum. Fundal gallbladder
lesion likely adenomyomatosis.

Liver lesions compatible with metastasis. These are multifocal with
similar distribution:

Index lesions as follows.

(Image 34, series 18): Left hepatic lobe lesion 1.1 cm. Previously
1.1 cm. This is in hepatic subsegment III

Right hepatic lobe lesion (image 47, series 18): Posterior right
hepatic lobe 13 mm, previously 16 mm.

Pancreas: No focal, suspicious pancreatic lesion. Ductal dilation
with similar appearance to the prior study.

Spleen:  Spleen is normal size without focal lesion.

Adrenals/Urinary Tract: Left adrenal adenoma unchanged. Right
adrenal is normal. Renal contours are smooth.

Stomach/Bowel: Limited assessment of the gastrointestinal tract.
Moderate duodenal diverticulum adjacent to the ampulla. No acute
gastrointestinal process to the extent visualized.

Vascular/Lymphatic:  No abdominal lymphadenopathy.

Vascular structures in the abdomen are patent. No aneurysm.

Other:  No abdominal wall hernia.

Musculoskeletal: Increased conspicuity of skeletal metastatic
lesions when compared to the prior MRI particularly in the lower
thoracic spine and in the lower lumbar spine image to a variable
extent on today's study as these areas are at the edges of the
imaged volume.
IMPRESSION: 1. Slight decrease in size of hepatic metastatic lesions, dominant
lesions compared to the prior study. No definite new lesions on
today's exam. Consider Eovist for follow-up as parenchymal
enhancement surrounding metastatic lesions allows for better exam to
exam assessment of subtle changes.
2. Small right effusion, increased when compared to the prior study.
3. Increased visibility of bony lesions on today's study is compared
to priors. Consider correlation with bone scan or CT as warranted.
This could be related to technical factors as well.
4. Stable pancreatic and biliary ductal dilation in the setting of
periampullary duodenal diverticulum. Attention on follow-up
5. Right lower lobe pulmonary lesion partially obscured by volume
loss and adjacent effusion better demonstrated on previous chest CT.

## 2021-05-25 ENCOUNTER — Telehealth: Payer: Self-pay

## 2021-05-25 ENCOUNTER — Other Ambulatory Visit: Payer: Self-pay | Admitting: Hematology and Oncology

## 2021-05-25 DIAGNOSIS — C50911 Malignant neoplasm of unspecified site of right female breast: Secondary | ICD-10-CM

## 2021-05-25 IMAGING — NM NM BONE WHOLE BODY
2 series · 2 of 2 positions shown · non-contrast
Comparison: Multiple exams, including chest CT 11/11/2019 and bone
scan of 07/09/2019

CLINICAL DATA: Breast cancer restaging

EXAM:
NUCLEAR MEDICINE WHOLE BODY BONE SCAN
TECHNIQUE: Whole body anterior and posterior images were obtained approximately
3 hours after intravenous injection of radiopharmaceutical.
RADIOPHARMACEUTICALS:  21.0 mCi Bechnetium-HHm MDP IV

[Series 1: wbr_bone_40 whole body · 2.66mm/px · 1 of 1 slices shown (1 of 2)]
[im 1/1]
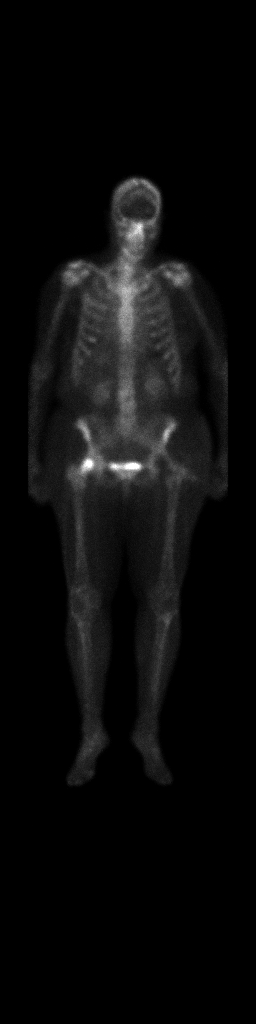

[Series 1: wbr_bone_40 whole body · 2.66mm/px · 1 of 1 slices shown (2 of 2)]
[im 1/1]
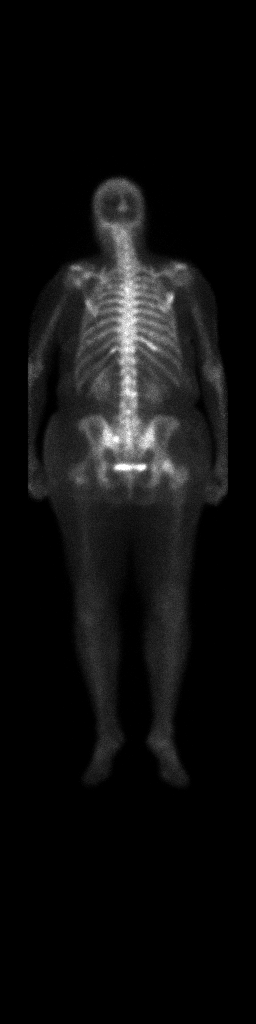

[2 of 2 positions shown; findings below may reference images not displayed]

FINDINGS: Similar appearance of accentuated uptake in both lower scapula
compatible with metastatic lesions. Accentuated uptake in the right
ninth and left tenth ribs medially compatible with metastatic
disease. Stable focally accentuated uptake in the left sacrum
suspicious for a metastatic lesion. Stable abnormal accentuated
activity in the right femoral head suspicious for metastatic lesion.
Stable faintly accentuated activity in the right anterior sixth rib
compatible with a metastatic lesion.

Left hip prosthesis.

Degenerative activity in the shoulders.
IMPRESSION: Stable scattered metastatic lesions in the thorax, left sacrum, and
right femoral head. No new lesions identified.

## 2021-05-25 IMAGING — CT CT CHEST W/ CM
2 of 3 series · 15 of 36 positions shown, 18 images · IV contrast (omnipaque)
Comparison: MRI abdomen from 11/10/2019; chest CT 07/09/2019

CLINICAL DATA: Breast cancer restaging.

EXAM:
CT CHEST WITH CONTRAST
TECHNIQUE: Multidetector CT imaging of the chest was performed during
intravenous contrast administration.
CONTRAST:  75mL OMNIPAQUE IOHEXOL 300 MG/ML  SOLN

[Series 2: axial st · axial · 0.68mm/px · z∈[-359,-81]mm · 12 of 165 slices shown, 15 images]
[im 13/165  mediastinal]
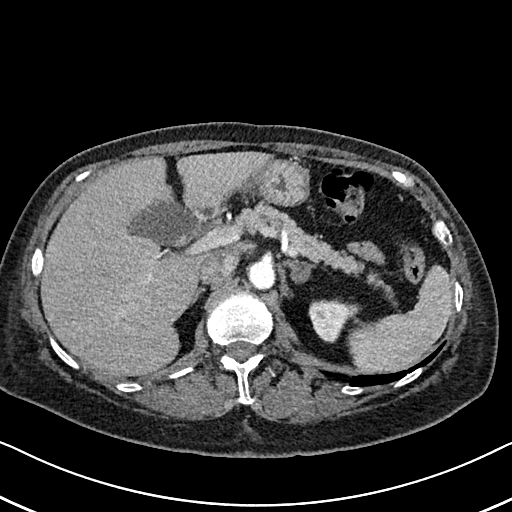
[im 13/165  lung]
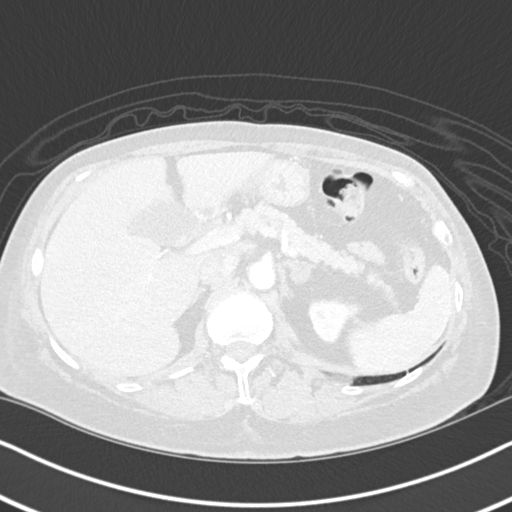
[im 25/165  lung]
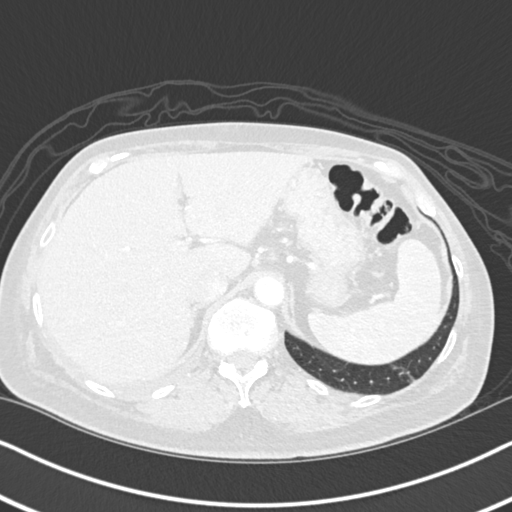
[im 37/165  lung]
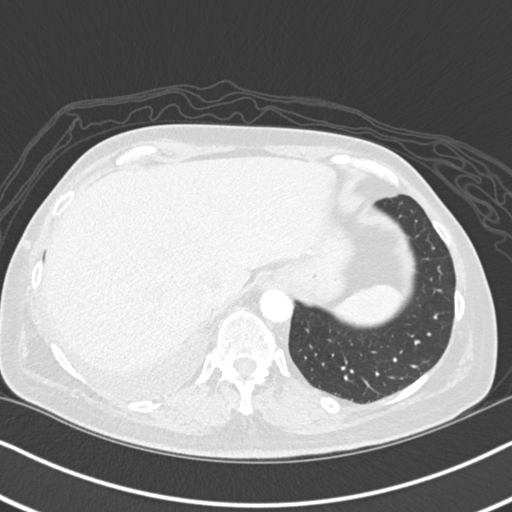
[im 49/165  lung]
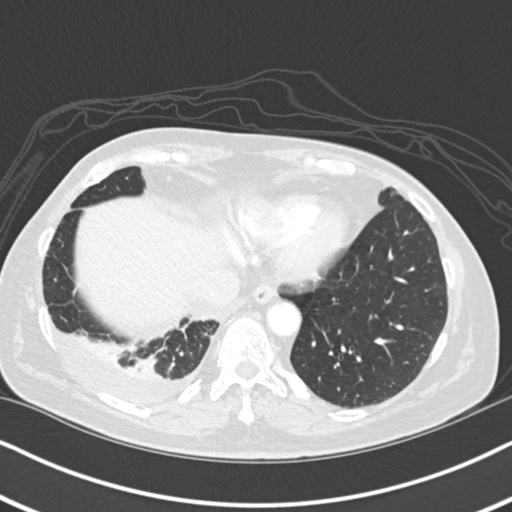
[im 61/165  mediastinal]
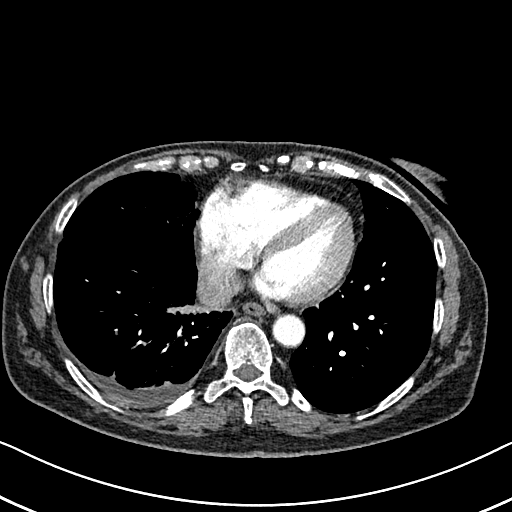
[im 61/165  lung]
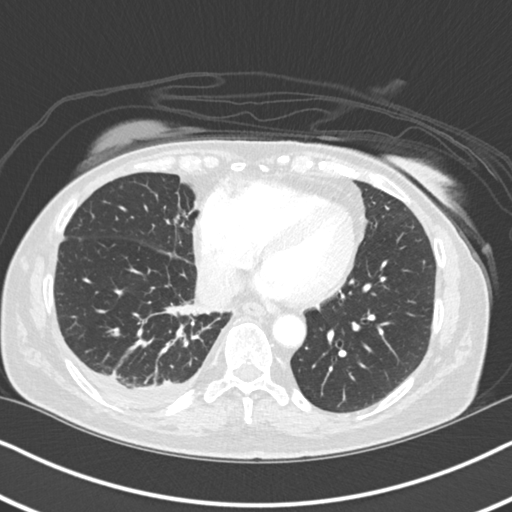
[im 73/165  lung]
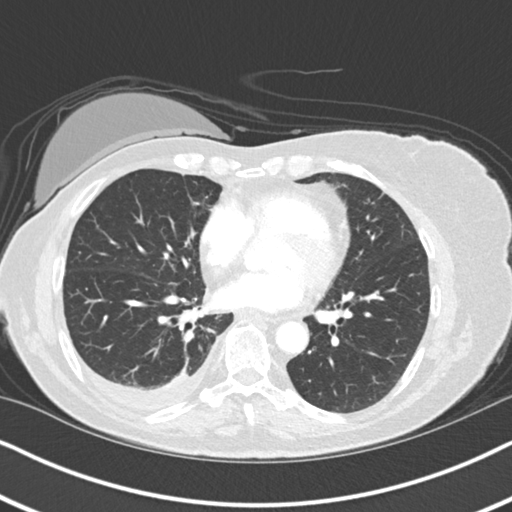
[im 92/165  lung]
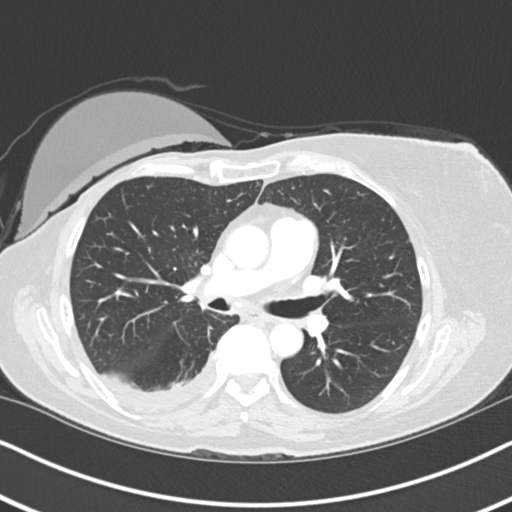
[im 104/165  lung]
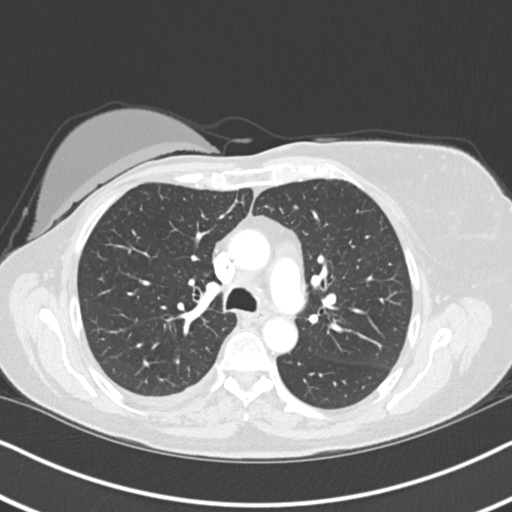
[im 116/165  mediastinal]
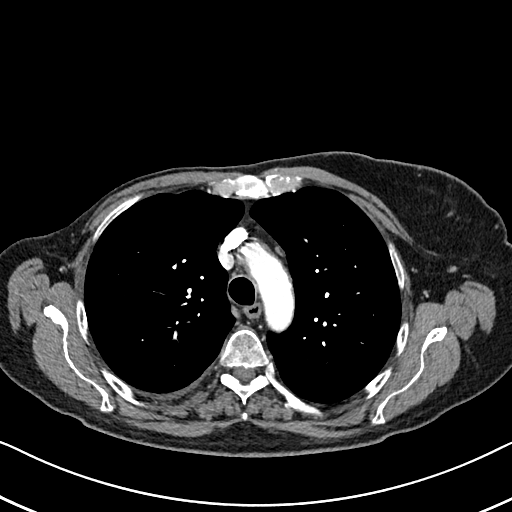
[im 116/165  lung]
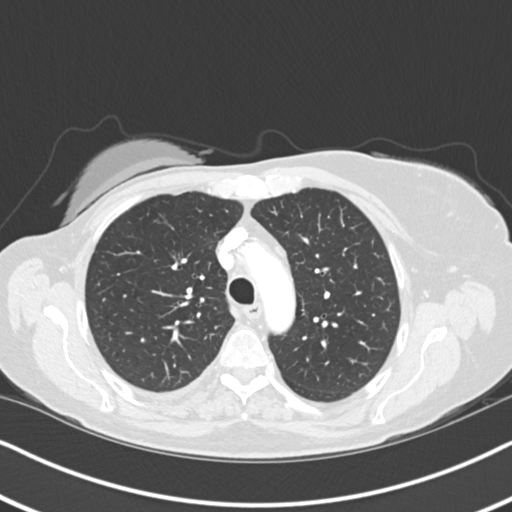
[im 128/165  lung]
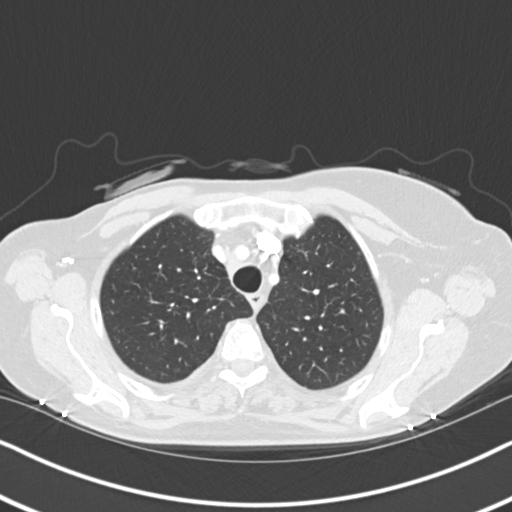
[im 140/165  lung]
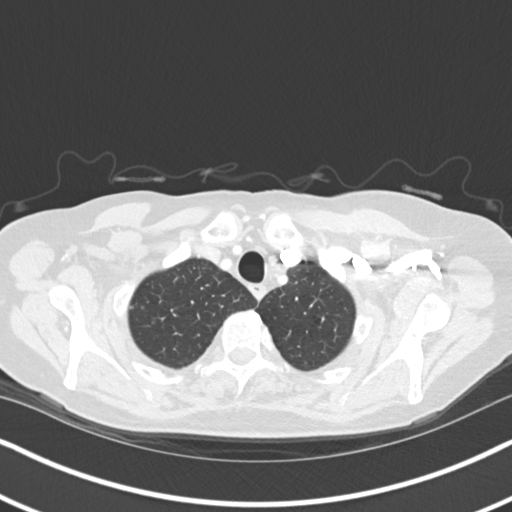
[im 152/165  lung]
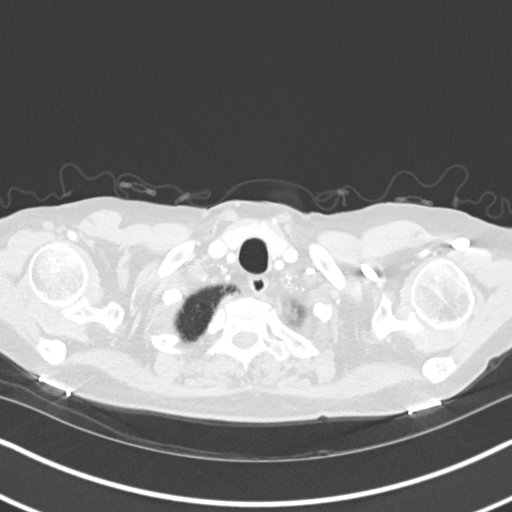

[Series 5: coronal · coronal · 0.64mm/px · 3 of 111 slices shown]
[im 23/111  lung]
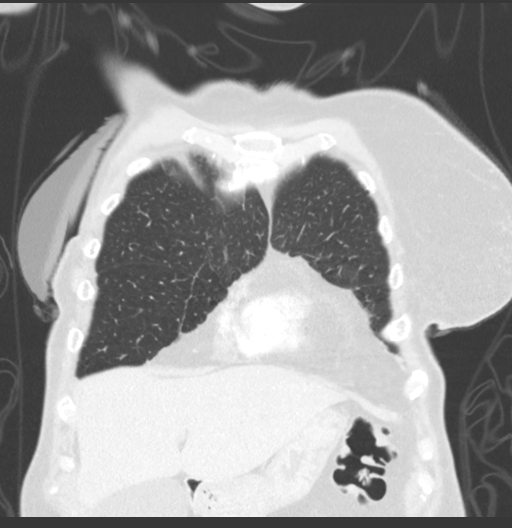
[im 45/111  lung]
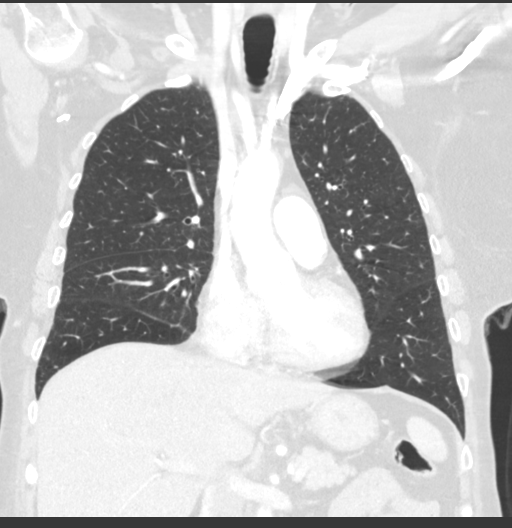
[im 67/111  lung]
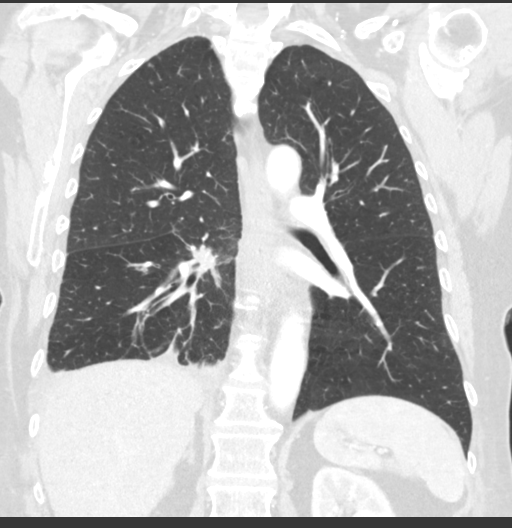

[15 of 36 positions shown; findings below may reference images not displayed]

FINDINGS: Cardiovascular: Atherosclerotic aortic arch.

Mediastinum/Nodes: Right paratracheal node 0.8 cm in short axis on
image 38/2, stable. This has somewhat indistinct margins.

Right hilar lymph node 0.9 cm in short axis on image 72/2,
previously 1.0 cm.

Lungs/Pleura: Small right pleural effusion, increased from prior,
with some enhancement along the parietal pleural margin which could
indicate an exudative effusion. I do not see an obvious mass along
the pleura.

Continued obstruction of the superior segmental bronchus of the
right lower lobe with soft tissue density in this vicinity currently
measuring 1.1 cm in thickness on image 83/7, formerly 1.1 cm.
Bandlike densities in the right lower lobe are relatively similar to
prior. Previous nodularity in the right lower lobe is likely
obscured by atelectasis related to the pleural effusion.

Upper Abdomen: The liver lesions have not progressed compared to
yesterday's abdominal MRI and are better depicted on that exam.

Musculoskeletal: Unchanged primarily lytic lesions in both scapula,
in the T4 vertebral body eccentric to the left, and in the ribs
(most notably in the right ninth and left tenth ribs).
IMPRESSION: 1. Small right pleural effusion, increased from prior, with some
enhancement along the parietal pleural margin which could indicate
an exudative effusion. The area of the previous pulmonary nodules is
obscured by the associated passive atelectasis.
2. Continued obstruction of the superior segmental bronchus of the
right lower lobe with soft tissue density in this vicinity currently
measuring 1.1 cm in thickness, formerly 1.1 cm.
3. Unchanged primarily lytic lesions in the scapula, T4 vertebral
body eccentric to the left, and in the ribs.
4. Similar appearance of bandlike densities in the right lower lobe,
probably from scarring.
5. The liver lesions have not progressed compared to yesterday's
abdominal MRI and are better depicted on that exam.
6. Aortic atherosclerosis.

Aortic Atherosclerosis ([id number]).

## 2021-05-25 MED ORDER — RIBOCICLIB SUCC (600 MG DOSE) 200 MG PO TBPK: ORAL_TABLET | ORAL | 3 refills | Status: DC

## 2021-05-25 NOTE — Telephone Encounter (Signed)
Pt called to request refill for Kisqali to [facility]. Request given to Dr Lindi Adie. Pt aware and verbalized thanks.

## 2021-05-29 ENCOUNTER — Other Ambulatory Visit: Payer: Self-pay | Admitting: Adult Health

## 2021-05-29 DIAGNOSIS — C787 Secondary malignant neoplasm of liver and intrahepatic bile duct: Secondary | ICD-10-CM

## 2021-05-29 DIAGNOSIS — C50911 Malignant neoplasm of unspecified site of right female breast: Secondary | ICD-10-CM

## 2021-05-30 ENCOUNTER — Other Ambulatory Visit (HOSPITAL_COMMUNITY): Payer: Self-pay

## 2021-05-30 ENCOUNTER — Other Ambulatory Visit: Payer: Self-pay

## 2021-05-30 ENCOUNTER — Ambulatory Visit (HOSPITAL_COMMUNITY)
Admission: RE | Admit: 2021-05-30 | Discharge: 2021-05-30 | Disposition: A | Discharge status: Home / Self Care | Payer: Medicare Other | Source: Ambulatory Visit | Attending: Adult Health | Admitting: Adult Health

## 2021-05-30 DIAGNOSIS — C7800 Secondary malignant neoplasm of unspecified lung: Secondary | ICD-10-CM | POA: Insufficient documentation

## 2021-05-30 DIAGNOSIS — C787 Secondary malignant neoplasm of liver and intrahepatic bile duct: Secondary | ICD-10-CM | POA: Insufficient documentation

## 2021-05-30 DIAGNOSIS — C50911 Malignant neoplasm of unspecified site of right female breast: Secondary | ICD-10-CM | POA: Diagnosis not present

## 2021-05-30 DIAGNOSIS — C7951 Secondary malignant neoplasm of bone: Secondary | ICD-10-CM | POA: Diagnosis not present

## 2021-05-30 DIAGNOSIS — K7689 Other specified diseases of liver: Secondary | ICD-10-CM | POA: Diagnosis not present

## 2021-05-30 MED ORDER — GADOBUTROL 1 MMOL/ML IV SOLN
6.0000 mL | Freq: Once | INTRAVENOUS | Status: AC | PRN
  Administered 2021-05-30: 6 mL via INTRAVENOUS

## 2021-05-31 ENCOUNTER — Inpatient Hospital Stay: Payer: Medicare Other | Attending: Oncology

## 2021-05-31 ENCOUNTER — Inpatient Hospital Stay (HOSPITAL_BASED_OUTPATIENT_CLINIC_OR_DEPARTMENT_OTHER): Payer: Medicare Other | Admitting: Oncology

## 2021-05-31 ENCOUNTER — Inpatient Hospital Stay: Payer: Medicare Other

## 2021-05-31 VITALS — BP 126/67 | HR 118 | Temp 97.9°F | Resp 18 | Ht 64.0 in | Wt 151.4 lb

## 2021-05-31 DIAGNOSIS — I3131 Malignant pericardial effusion in diseases classified elsewhere: Secondary | ICD-10-CM

## 2021-05-31 DIAGNOSIS — C787 Secondary malignant neoplasm of liver and intrahepatic bile duct: Secondary | ICD-10-CM

## 2021-05-31 DIAGNOSIS — C50911 Malignant neoplasm of unspecified site of right female breast: Secondary | ICD-10-CM

## 2021-05-31 DIAGNOSIS — C50411 Malignant neoplasm of upper-outer quadrant of right female breast: Secondary | ICD-10-CM

## 2021-05-31 DIAGNOSIS — C7951 Secondary malignant neoplasm of bone: Secondary | ICD-10-CM

## 2021-05-31 DIAGNOSIS — Z170 Estrogen receptor positive status [ER+]: Secondary | ICD-10-CM

## 2021-05-31 DIAGNOSIS — C7800 Secondary malignant neoplasm of unspecified lung: Secondary | ICD-10-CM

## 2021-05-31 DIAGNOSIS — Z5111 Encounter for antineoplastic chemotherapy: Secondary | ICD-10-CM | POA: Diagnosis not present

## 2021-05-31 LAB — COMPREHENSIVE METABOLIC PANEL
ALT: 8 U/L (ref 0–44)
AST: 15 U/L (ref 15–41)
Albumin: 3.6 g/dL (ref 3.5–5.0)
Alkaline Phosphatase: 102 U/L (ref 38–126)
Anion gap: 8 (ref 5–15)
BUN: 20 mg/dL (ref 8–23)
CO2: 19 mmol/L — ABNORMAL LOW (ref 22–32)
Calcium: 8.9 mg/dL (ref 8.9–10.3)
Chloride: 110 mmol/L (ref 98–111)
Creatinine, Ser: 1.1 mg/dL — ABNORMAL HIGH (ref 0.44–1.00)
GFR, Estimated: 54 mL/min — ABNORMAL LOW (ref >60)
Glucose, Bld: 161 mg/dL — ABNORMAL HIGH (ref 70–99)
Potassium: 4.3 mmol/L (ref 3.5–5.1)
Sodium: 137 mmol/L (ref 135–145)
Total Bilirubin: 0.3 mg/dL (ref 0.3–1.2)
Total Protein: 7.5 g/dL (ref 6.5–8.1)

## 2021-05-31 LAB — CBC WITH DIFFERENTIAL/PLATELET
Abs Immature Granulocytes: 0.03 10*3/uL (ref 0.00–0.07)
Basophils Absolute: 0.1 10*3/uL (ref 0.0–0.1)
Basophils Relative: 2 %
Eosinophils Absolute: 0 10*3/uL (ref 0.0–0.5)
Eosinophils Relative: 1 %
HCT: 40.4 % (ref 36.0–46.0)
Hemoglobin: 13.7 g/dL (ref 12.0–15.0)
Immature Granulocytes: 1 %
Lymphocytes Relative: 7 %
Lymphs Abs: 0.4 10*3/uL — ABNORMAL LOW (ref 0.7–4.0)
MCH: 34.9 pg — ABNORMAL HIGH (ref 26.0–34.0)
MCHC: 33.9 g/dL (ref 30.0–36.0)
MCV: 103.1 fL — ABNORMAL HIGH (ref 80.0–100.0)
Monocytes Absolute: 0.6 10*3/uL (ref 0.1–1.0)
Monocytes Relative: 10 %
Neutro Abs: 4.3 10*3/uL (ref 1.7–7.7)
Neutrophils Relative %: 79 %
Platelets: 191 10*3/uL (ref 150–400)
RBC: 3.92 MIL/uL (ref 3.87–5.11)
RDW: 14.6 % (ref 11.5–15.5)
WBC: 5.5 10*3/uL (ref 4.0–10.5)
nRBC: 0 % (ref 0.0–0.2)

## 2021-05-31 MED ORDER — FULVESTRANT 250 MG/5ML IM SOSY
500.0000 mg | PREFILLED_SYRINGE | Freq: Once | INTRAMUSCULAR | Status: AC
Start: 2021-05-31 — End: 2021-05-31
  Administered 2021-05-31: 500 mg via INTRAMUSCULAR
  Filled 2021-05-31 (×2): qty 10

## 2021-05-31 MED ORDER — DENOSUMAB 120 MG/1.7ML ~~LOC~~ SOLN
120.0000 mg | Freq: Once | SUBCUTANEOUS | Status: AC
  Administered 2021-05-31: 120 mg via SUBCUTANEOUS
  Filled 2021-05-31: qty 1.7

## 2021-05-31 MED ORDER — RIBOCICLIB SUCC (600 MG DOSE) 200 MG PO TBPK: ORAL_TABLET | ORAL | 6 refills | Status: DC

## 2021-05-31 NOTE — Progress Notes (Signed)
[facility]  [phone] [phone]    ID: Barbara Parks DOB: 1950/08/22  MR#: [id number]  GTX#:[id number]  Patient Care Team: [name], MD (Inactive) as PCP - General (Family Medicine) Jerline Pain, MD (Cardiology) Gaye Pollack, MD (Cardiothoracic Surgery) Hollie Bartus, Virgie Dad, MD as Consulting Physician (Oncology) Raina Mina, RPH-CPP (Pharmacist) Renaldo Harrison, MD as Referring Physician (Radiation Oncology) OTHER MD: Dr Erroll Luna- Merlene Laughter, DDS; Gaynelle Arabian MD   CHIEF COMPLAINT: metastatic breast cancer, formerly on BOLERO study (s/p right mastectomy)  CURRENT TREATMENT: Fulvestrant, ribociclib, denosumab/Xgeva   INTERVAL HISTORY: Barbara Parks returns today for follow-up and treatment of her metastatic breast cancer.   Since her last visit, she underwent left screening mammography with tomography at [facility] on 05/07/2021 showing: breast density category B; possible mass in left axilla.   She proceeded to left axilla ultrasound on 05/18/2021 showing: no evidence of malignancy; benign lymph node in left axilla corresponding to mammographic finding.  She also underwent restaging liver MRI yesterday, 05/30/2021, showing: stable liver metastases with no new or progressive lesions; stable left adrenal adenoma; mild increase in small to moderate right pleural effusion; stable osseous metastasis to thoracolumbar spine.  Barbara Parks is taking the Ribociclib 256m, three tablets per day 21 days on and 7 days off.  She is completing her week off today, and will restart these tomorrow.    She continues on fulvestrant every 4 weeks and is tolerating this quite well.  She receives xgeva also every 4 weeks with no issues.  She has no dental concerns at present.   REVIEW OF SYSTEMS: Barbara Parks me being recalled from mammography was devastating to her.  It is not so much that she was afraid of cancer as that she already is at the limit and  she does not have a lot of slack left to deal with new problems.  She was delighted of course to get the good news that everything was okay.  [facility].  She has had no palpitations, rash or significant diarrhea.  She does feel fatigued and some days it is hard for her to get anything done.  Other days she functions more normally.  She is obtaining it at no cost but she is having some difficulty getting it on time.  For exercise she is doing some dog walking and yard work.  She is not playing golf currently.  A detailed review of systems is stable.  COVID 19 VACCINATION STATUS: Refuses vaccination   BREAST CANCER HISTORY: From Dr. PCollier SalinaRubin's original intake note 04/13/2004:  "This woman has been in good health all of her life. [name].  She palpated a mass at about the 12 o'clock position in mid-July. She has not noticed any nipple retraction or skin changes.  She was seen by her primary care doctor who subsequently referred her for a mammogram.  Mammogram was performed on 03/01/04. This demonstrated a spiculated 2 cm mass at the 12 o'clock position in the right breast.  Upper outer quadrant of the left breast shows some distortion.   Physical exam at that time showed a firm, nontender nodule at the 12 o'clock position in the right breast, 5 cm from the nipple.  Ultrasound of this area showed a hypoechoic ill-defined mass, measuring 2.2 x 1.3 x 1.4 cm.  Physical exam of the left breast showed general vague thickening, upper outer quadrant of the left  breast with a discrete palpable mass. The ultrasound performed showed a single hypoechoic ill-defined nodule at the 1 o'clock position, measuring 7 x 5 x 6 mm.    She had biopsies of both lesions on 03/02/03.  Needle core biopsy of the lesion on the right breast revealed invasive mammary carcinoma.  Needle core biopsy of the left breast showed a complex fibroadenoma.  Prognostic panel of the  lesion on the right breast showed it to be ER positive at 73%, PR positive at 90% and proliferative index 9%, HER-2 was 1+.  Patient was referred to Dr. Annamaria Boots, who performed a simple mastectomy with sentinel lymph node evaluation on 03/22/04.  Final pathology showed this to be an invasive ductal carcinoma  with lobular features, measuring 2.2 cm, grade 2 of 3.  Margins negative for carcinoma.  Invasive ductal carcinoma was extended to involve deep dermis of the nipple.  Lymphovascular invasion was identified.  Total of 4 sentinel lymph nodes were evaluated.  Touch imprints at the time of the OR was felt to be negative.  Subsequent evaluation showed a 5 mm focus of metastatic carcinoma in one of the four lymph nodes on microscopic after sectioning.  There was extracapsular extension  of one lymph node as well. The remaining three lymph nodes were all negative."  The patient's subsequent history is detailed above.   PAST MEDICAL HISTORY: Past Medical History:  Diagnosis Date   Arthritis    left hip   Asthma    breast ca 2005   breast/chemo R mastectomy   Breast cancer ([place])    Dermatitis    Diabetes mellitus without complication ([place])    GERD (gastroesophageal reflux disease)    Hypercholesteremia    Metastasis to lung ([place]) dx'd 08/2011   Osteopenia due to cancer therapy 09/09/2013   Palpitations    Personal history of chemotherapy    Shortness of breath     PAST SURGICAL HISTORY: Past Surgical History:  Procedure Laterality Date   BREAST SURGERY  2005   right   CATARACT EXTRACTION W/PHACO Left 01/18/2014   Procedure: CATARACT EXTRACTION PHACO AND INTRAOCULAR LENS PLACEMENT ([place]);  Surgeon: Elta Guadeloupe T. Gershon Crane, MD;  Location: [facility];  Service: Ophthalmology;  Laterality: Left;  CDE 15.79   CATARACT EXTRACTION W/PHACO Right 02/08/2014   Procedure: CATARACT EXTRACTION PHACO AND INTRAOCULAR LENS PLACEMENT ([facility]);  Surgeon: Elta Guadeloupe T. Gershon Crane, MD;  Location: [facility];  Service: Ophthalmology;   Laterality: Right;  CDE 4.41   CHEST TUBE INSERTION  09/11/2011   Procedure: INSERTION PLEURAL DRAINAGE CATHETER;  Surgeon: Gaye Pollack, MD;  Location: [facility];  Service: Thoracic;  Laterality: Right;   MASTECTOMY Right    PERICARDIAL WINDOW  09/11/2011   Procedure: PERICARDIAL WINDOW;  Surgeon: Gaye Pollack, MD;  Location: [facility];  Service: Thoracic;  Laterality: N/A;   REMOVAL OF PLEURAL DRAINAGE CATHETER  12/19/2011   Procedure: REMOVAL OF PLEURAL DRAINAGE CATHETER;  Surgeon: Gaye Pollack, MD;  Location: [facility];  Service: Thoracic;  Laterality: Right;  TO BE DONE IN MINOR ROOM, [place] Left 06/07/2015   Procedure: LEFT TOTAL HIP ARTHROPLASTY ANTERIOR APPROACH;  Surgeon: Gaynelle Arabian, MD;  Location: [facility];  Service: Orthopedics;  Laterality: Left;   VIDEO BRONCHOSCOPY  09/11/2011   Procedure: VIDEO BRONCHOSCOPY;  Surgeon: Gaye Pollack, MD;  Location: [facility];  Service: Thoracic;  Laterality: N/A;    FAMILY HISTORY Family History  Problem Relation Age of Onset   Cancer Mother        breast   Heart disease Father    Diabetes Other    Anesthesia problems Neg Hx   The patient's mother was diagnosed with breast cancer at age 21, she died age 71 from congestive heart failure.The patient's father died from heart disease at age 80.  She has one sister alive & well.  Two brothers alive & well, one with diabetes. The patient's sister was also diagnosed with breast cancer, and was tested for the BRCA gene, and was negative. The patient herself has not been tested. There is no history of ovarian cancer in the family   GYNECOLOGIC HISTORY:  No LMP recorded. Patient is postmenopausal. Menarche age 24, the patient is GX P0. She stopped having periods with chemotherapy in 2005. 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 She was a Government social research officer. She is single, lives alone with her Shitzu-poodle Sammie.. Family is all in the [place] area.     ADVANCED DIRECTIVES: Not in place. At the 02/23/2014 visit the patient was given the appropriate documents to complete and notarize at her discretion. She tells me she is planning to name her sister, Billie Ruddy, as healthcare power of attorney. [name] can be reached at [phone]   HEALTH MAINTENANCE: Social History   Tobacco Use   Smoking status: Former    Packs/day: 1.50    Years: 30.00    Pack years: 45.00    Types: Cigarettes    Quit date: 09/10/2007    Years since quitting: 13.7   Smokeless tobacco: Never  Substance Use Topics   Alcohol use: No   Drug use: No    Colonoscopy:  PAP:  Bone density: 04/09/2018, T score -1.8  Lipid panel:  Allergies  Allergen Reactions   Aspirin     REACTION: upset stomach   Latex Other (See Comments)    Blistering and skin peels off   Nsaids Nausea And Vomiting    Extreme nausea and vomiting    Current Outpatient Medications  Medication Sig Dispense Refill   cholecalciferol (VITAMIN D3) 25 MCG (1000 UT) tablet Take 2 tablets (2,000 Units total) by mouth daily. 180 tablet 4   cyclobenzaprine (FLEXERIL) 10 MG tablet Take 1 tablet (10 mg total) by mouth 2 (two) times daily as needed for muscle spasms. 20 tablet 0   gemfibrozil (LOPID) 600 MG tablet TAKE 1 TABLET (600 MG TOTAL) BY MOUTH 2 (TWO) TIMES DAILY BEFORE A MEAL. 180 tablet 1   loperamide (IMODIUM) 2 MG capsule Take 2 mg by mouth 4 (four) times daily as needed for diarrhea or loose stools. Reported on 11/30/2015     metFORMIN (GLUCOPHAGE-XR) 500 MG 24 hr tablet Take 1 tablet (500 mg total) by mouth 3 (three) times daily before meals.  3   oxyCODONE-acetaminophen (PERCOCET/ROXICET) 5-325 MG tablet Take 1 tablet by mouth at bedtime as needed for severe pain. 30 tablet 0   prochlorperazine (COMPAZINE) 10 MG tablet Take 1 tablet (10 mg total) by mouth every 8 (eight)  hours as needed for nausea or vomiting. 30 tablet 0   ribociclib succ (KISQALI 600MG DAILY DOSE) 200 MG Therapy Pack TAKE 3 TABLETS (600 MG TOTAL) BY MOUTH DAILY. TAKE FOR 21 DAYS ON, 7 DAYS OFF, REPEAT EVERY 28 DAYS. START 08/23/2020 63 each 6  No current facility-administered medications for this visit.    OBJECTIVE: White woman in no acute distress Vitals:   05/31/21 1224  BP: 126/67  Pulse: (!) 118  Resp: 18  Temp: 97.9 F (36.6 C)  SpO2: 100%   Wt Readings from Last 3 Encounters:  05/31/21 151 lb 6.4 oz (68.7 kg)  04/05/21 148 lb 12.8 oz (67.5 kg)  03/06/21 151 lb (68.5 kg)   Body mass index is 25.99 kg/m.    ECOG FS:1 - Symptomatic but completely ambulatory  Sclerae unicteric, EOMs intact Wearing a mask No cervical or supraclavicular adenopathy Lungs no rales or rhonchi Heart regular rate and rhythm Abd soft, nontender, positive bowel sounds MSK no focal spinal tenderness, no upper extremity lymphedema Neuro: nonfocal, well oriented, appropriate affect Breasts: Deferred   LAB  RESULTS:  CMP     Component Value Date/Time   NA 137 05/31/2021 1202   NA 139 07/08/2017 0942   K 4.3 05/31/2021 1202   K 3.8 07/08/2017 0942   CL 110 05/31/2021 1202   CL 109 (H) 12/31/2012 0940   CO2 19 (L) 05/31/2021 1202   CO2 18 (L) 07/08/2017 0942   GLUCOSE 161 (H) 05/31/2021 1202   GLUCOSE 156 (H) 07/08/2017 0942   GLUCOSE 127 (H) 12/31/2012 0940   BUN 20 05/31/2021 1202   BUN 17.7 07/08/2017 0942   CREATININE 1.10 (H) 05/31/2021 1202   CREATININE 1.18 (H) 12/25/2017 0959   CREATININE 1.0 07/08/2017 0942   CALCIUM 8.9 05/31/2021 1202   CALCIUM 10.4 07/08/2017 0942   PROT 7.5 05/31/2021 1202   PROT 7.7 07/08/2017 0942   ALBUMIN 3.6 05/31/2021 1202   ALBUMIN 3.4 (L) 07/08/2017 0942   AST 15 05/31/2021 1202   AST 50 (H) 12/25/2017 0959   AST 54 (H) 07/08/2017 0942   ALT 8 05/31/2021 1202   ALT 53 12/25/2017 0959   ALT 39 07/08/2017 0942   ALKPHOS 102 05/31/2021  1202   ALKPHOS 232 (H) 07/08/2017 0942   BILITOT 0.3 05/31/2021 1202   BILITOT 0.3 12/25/2017 0959   BILITOT 0.42 07/08/2017 0942   GFRNONAA 54 (L) 05/31/2021 1202   GFRNONAA 47 (L) 12/25/2017 0959   GFRAA 49 (L) 04/05/2020 1504   GFRAA 54 (L) 12/25/2017 0959    I No results found for: SPEP  Lab Results  Component Value Date   WBC 5.5 05/31/2021   NEUTROABS 4.3 05/31/2021   HGB 13.7 05/31/2021   HCT 40.4 05/31/2021   MCV 103.1 (H) 05/31/2021   PLT 191 05/31/2021      Chemistry      Component Value Date/Time   NA 137 05/31/2021 1202   NA 139 07/08/2017 0942   K 4.3 05/31/2021 1202   K 3.8 07/08/2017 0942   CL 110 05/31/2021 1202   CL 109 (H) 12/31/2012 0940   CO2 19 (L) 05/31/2021 1202   CO2 18 (L) 07/08/2017 0942   BUN 20 05/31/2021 1202   BUN 17.7 07/08/2017 0942   CREATININE 1.10 (H) 05/31/2021 1202   CREATININE 1.18 (H) 12/25/2017 0959   CREATININE 1.0 07/08/2017 0942      Component Value Date/Time   CALCIUM 8.9 05/31/2021 1202   CALCIUM 10.4 07/08/2017 0942   ALKPHOS 102 05/31/2021 1202   ALKPHOS 232 (H) 07/08/2017 0942   AST 15 05/31/2021 1202   AST 50 (H) 12/25/2017 0959   AST 54 (H) 07/08/2017 0942   ALT 8 05/31/2021 1202   ALT 53 12/25/2017 0959   ALT  39 07/08/2017 0942   BILITOT 0.3 05/31/2021 1202   BILITOT 0.3 12/25/2017 0959   BILITOT 0.42 07/08/2017 0942       Lab Results  Component Value Date   LABCA2 58 (H) 09/14/2012    No components found for: [id number]  No results for input(s): INR in the last 168 hours.  Urinalysis    Component Value Date/Time   COLORURINE YELLOW 11/25/2017 0901   APPEARANCEUR HAZY (A) 11/25/2017 0901   LABSPEC 1.017 11/25/2017 0901   LABSPEC 1.030 07/13/2015 0841   PHURINE 5.0 11/25/2017 0901   GLUCOSEU NEGATIVE 11/25/2017 0901   GLUCOSEU Negative 07/13/2015 0841   HGBUR MODERATE (A) 11/25/2017 0901   BILIRUBINUR NEGATIVE 11/25/2017 0901   BILIRUBINUR Negative 07/13/2015 0841   KETONESUR NEGATIVE  11/25/2017 0901   PROTEINUR 100 (A) 11/25/2017 0901   UROBILINOGEN 0.2 07/13/2015 0841   NITRITE NEGATIVE 11/25/2017 0901   LEUKOCYTESUR NEGATIVE 11/25/2017 0901   LEUKOCYTESUR Negative 07/13/2015 0841    STUDIES: MR LIVER W WO CONTRAST  Result Date: 05/30/2021 CLINICAL DATA:  Breast cancer.  Assess treatment response. EXAM: MRI ABDOMEN WITHOUT AND WITH CONTRAST TECHNIQUE: Multiplanar multisequence MR imaging of the abdomen was performed both before and after the administration of intravenous contrast. CONTRAST:  22m GADAVIST GADOBUTROL 1 MMOL/ML IV SOLN COMPARISON:  MRI 02/21/2021 FINDINGS: Lower chest: Small to moderate right pleural effusion appears mildly increased in volume from previous exam. Hepatobiliary: Multifocal liver metastases are again noted. Index lesion within the lateral segment of left lobe of liver measures 1.0 x 1.0 cm, image 15/4. Stable from previous exam. Index lesion within the lateral segment of left lobe of liver adjacent to the falciform ligament measures 1.4 x 1.2 cm, image 16/4. Previously 1.7 x 1.5 cm. Index lesion within segment 7 measures 1.1 x 1.1 cm, image 16/4. Unchanged in the interval. Gallbladder is unremarkable.  No bile duct dilatation. Pancreas: No mass, inflammatory changes, or other parenchymal abnormality identified. Spleen:  Within normal limits in size and appearance. Adrenals/Urinary Tract: Normal right adrenal gland. Left adrenal nodule compatible with a benign adenoma is again noted measuring 1.6 cm, image 49/3. No suspicious kidney mass or hydronephrosis. Stomach/Bowel: Visualized portions within the abdomen are unremarkable. Vascular/Lymphatic: No pathologically enlarged lymph nodes identified. No abdominal aortic aneurysm demonstrated. Other:  No ascites or fluid collection. Musculoskeletal: Lesion involving enhancing lesion within the L3 vertebral body appears stable from the previous exam, image 20/3. Also unchanged are lesions involving the T11 and  T10 vertebral bodies, image 22/3. IMPRESSION: 1. Stable appearance of multifocal liver metastases. No new or progressive liver lesions identified. 2. Stable appearance of left adrenal adenoma. 3. Small to moderate right pleural effusion, mildly increased in volume from previous exam. 4. Stable osseous metastasis to the thoracolumbar spine. Electronically Signed   By: TKerby MoorsM.D.   On: 05/30/2021 13:24   MM 3D SCREEN BREAST UNI LEFT  Result Date: 05/10/2021 CLINICAL DATA:  Screening. EXAM: DIGITAL SCREENING UNILATERAL LEFT MAMMOGRAM WITH CAD AND TOMOSYNTHESIS TECHNIQUE: Left screening digital craniocaudal and mediolateral oblique mammograms were obtained. Left screening digital breast tomosynthesis was performed. The images were evaluated with computer-aided detection. COMPARISON:  Previous exam(s). ACR Breast Density Category b: There are scattered areas of fibroglandular density. FINDINGS: In the left axilla, a possible mass warrants further evaluation. Patient is status post right mastectomy. IMPRESSION: Further evaluation is suggested for possible mass in the left axilla. RECOMMENDATION: Ultrasound of the left axilla. (Code:US-L-91M) The patient will be contacted regarding the findings,  and additional imaging will be scheduled. BI-RADS CATEGORY  0: Incomplete. Need additional imaging evaluation and/or prior mammograms for comparison. Electronically Signed   By: Lillia Mountain M.D.   On: 05/10/2021 14:10   [place] AXILLA LEFT  Result Date: 05/18/2021 CLINICAL DATA:  Patient returns today to evaluate a possible mass within the LEFT axilla. History of RIGHT breast cancer status post mastectomy. EXAM: ULTRASOUND OF THE LEFT AXILLA COMPARISON:  Previous exams including recent screening mammogram dated 05/07/2021. FINDINGS: Ultrasound is performed, evaluating the LEFT axilla, showing several normal-appearing lymph nodes, 1 of which corresponds to the recent mammographic finding. There are no enlarged or  morphologically abnormal lymph nodes identified by ultrasound in the LEFT axilla. IMPRESSION: No evidence of malignancy. Benign lymph node in the LEFT axilla, corresponding to the mammographic finding. RECOMMENDATION: Screening mammogram in one year.(Code:SM-B-01Y) I have discussed the findings and recommendations with the patient. If applicable, a reminder letter will be sent to the patient regarding the next appointment. BI-RADS CATEGORY  2: Benign. Electronically Signed   By: Franki Cabot M.D.   On: 05/18/2021 12:57    ASSESSMENT: 70 y.o. [place], [place] woman with stage IV breast cancer, on BOLERO-4 trial  (1) status post right mastectomy and sentinel lymph node sampling 03/22/2004 for a right upper-outer quadrant pT2 pN1, stage IIB invasive ductal carcinoma with lobular features, grade 2, estrogen receptor and progesterone receptor positive, HER-2 negative, with an MIB-1 of 9% ([id number] and [id number])  (2) addition all right axillary lymph node sampling 05/07/2004 showed 2 benign lymph nodes (4 lymph nodes previously removed, so total was one positive lymph node out of 6; [id number])  (3) the patient was evaluated by radiation oncology; no postmastectomy radiation recommended  (4) adjuvant chemotherapy with dose dense doxorubicin and cyclophosphamide x4 cycles (first cycle delayed one week) followed by dose dense paclitaxel x4 was completed 09/18/2004  (5) tamoxifen started March 2006, discontinued 2009  METASTATIC DISEASE: February 2013 (6) presenting with a large pericardial effusion, large right pleural effusion and possible right middle lobe bronchial obstruction February 2013, status post pericardial window placement, fiberoptic bronchoscopy and right Pleurx placement 09/11/2011, with biopsy of the bronchus intermedius and pericardium positive for metastatic breast cancer, estrogen receptor 91% positive with moderate staining intensity, progesterone receptor 100% positive with strong staining  intensity, with an MIB-1 of 35%, and no HER-2 amplification, the signals ratio being 1.37 ([id number] [id number])  (7) enrolled in BOLERO-4 trial 10/10/2011, receiving letrozole and everolimus  (a) two small areas of enhancement in the cerebellum noted by brain MRI 10/03/2011 were no longer apparent on repeat MRI 08/21/2012-- most recent brain MRI 04/01/2014 showed no evidence of intracranial metastatic disease  (b) sclerotic lesions in left iliac bone and sacrum have not been biopsied; stable; to start zolendronate after patient updates her dental care (extraction planned)--never started  (c) RLL lung nodule, stable (rescanned 07/12/2014 and 08/11/2014) R hilar and subcarinal lymph nodes: stable  (d) CT of the chest:03/25/2018, stable  (e) CT Angio 12/12/2018 stable  (f) off BOLERO trial (trial ended) as of July 2020, but continuing on study drugs  (g) letrozole and everolimus discontinued January 2021 with disease progression  (8) additional problems:  (a) hepatic steatosis  (b) COPD/ emphysema/ asthma  (c) advanced L hip osteoarthritis, status post left total hip replacement 06/07/2015  (d) aortoiliac atherosclerosis  (e) dental evaluation pending w possible dental extractions  (f) likely thalassemia trait  (g) hyperlipidemia  (9) Bone density concerns: DEXA scan 08/11/2013 was normal  (a)  repeat DEXA scan 04/09/2018 shows a T score of -1.8, osteopenia  (10) disease monitoring:  (a) PET scan 12/22/2018 shows no hypermetabolic soft tissue metastases but multifocal bony metastasis  (b) denosumab/Xgeva started on 02/25/2019, repeated every 28 days (delayed due to dental extraction healing issues)  (c) bone scan 05/13/2019 serves as baseline study with increased activity in the right scapular, lower ribs and right femoral head (stable compared to PET scan of May 2020  (d) bone scan 07/09/2019 stable  (e) CT chest 07/09/2019 shows stable lung lesions but emerging left liver lobe 0.7 cm lesion  (f)  liver MRI 08/03/2019 shows approximately 8 liver lesions, the largest measuring 1.8 cm.  (g) liver biopsy 08/06/2019 confirms metastatic breast cancer, estrogen and progesterone receptor positive, with an MIB-1 of 5% and HER-2 not amplified  (h) chest CT and bone scan 11/11/2019 essentially stable  (i) liver MRI 02/01/2020 stable to improved  (j) chest CT and bone scan 04/26/2020 stable  (Continued under #12)  (11) denosumab/Xgeva begun 02/25/2019 repeated every 4 weeks  (12) fulvestrant started 08/24/2019, repeated every 4 weeks  (a) palbociclib 125 mg daily 21 days on 7 days off started 08/25/2019  (b) chest CT 07/24/2020 stable  (c) liver MRI 07/25/2020 shows mild progression in measurable disease  (d) palbociclib discontinued and ribociclib substituted starting 08/22/2020   (i) QTc on 08/22/2020 was 430   (ii) QTc on 10/17/2020 was 441   (iii) QTc on 05/31/2021 was 441  (e) liver MRI 04/1 02/22/2021 shows stable disease   (f) liver MRI 02/2021 shows stable disease  (g) bone scan 02/21/2021 shows no significant change  (h) liver MRI 05/29/2021 shows stable disease.  (13) foundation 1 requested on liver biopsy from 08/16/2019 shows a stable microsatellite status with 1 mutation/MB, amplification of C11orf30, FGF10 and RICTOR, with mutations in ESR1 and PTEN  (14) palliative radiation (Dr. Lynnette Caffey in [place]) 02/28/2021 to right hip area   PLAN: Dail is coming up on 10 years from initial diagnosis of metastatic breast cancer.  Her disease is well controlled and now that she has had some radiation to the right hip area she is essentially pain-free.  If she has some pain she only takes Tylenol or takes Tylenol with Motrin together.  This is clearly an improvement.  She is tolerating the ribociclib well and we did an EKG today which shows no increase in her QTc.  It showed only sinus tachycardia.  She has minimal diarrhea from this medication.  If she does she knows to take Imodium.  The  plan accordingly is to continue current treatment which includes the Laqueta Jean and fulvestrant.  She will return in 4 and 8 weeks and then again in 12 weeks and before the 12-week visit she will have a repeat MRI of the liver which is our area of measurable disease.  I do not know why she is having difficulty obtaining her to [facility] on time.  My nurse has done what she can to try to correct this.  I am going to see if our pharmacy folks are able to help.  Total encounter time 35 minutes.Sarajane Jews C. Batul Diego, MD 05/31/21 2:43 PM Medical Oncology and Hematology [facility] [place], [place] [address] Tel. [phone]    Fax. [phone]   I, Wilburn Mylar, am acting as scribe for Dr. Virgie Dad. Minyon Billiter.  I, Lurline Del MD, have reviewed the above documentation for accuracy and completeness, and I agree with the  above.    *Total Encounter Time as defined by the Centers for Medicare and Medicaid Services includes, in addition to the face-to-face time of a patient visit (documented in the note above) non-face-to-face time: obtaining and reviewing outside history, ordering and reviewing medications, tests or procedures, care coordination (communications with other health care professionals or caregivers) and documentation in the medical record.

## 2021-06-01 ENCOUNTER — Other Ambulatory Visit (HOSPITAL_COMMUNITY): Payer: Self-pay

## 2021-06-14 ENCOUNTER — Other Ambulatory Visit: Payer: Self-pay | Admitting: *Deleted

## 2021-06-14 DIAGNOSIS — C7800 Secondary malignant neoplasm of unspecified lung: Secondary | ICD-10-CM

## 2021-06-14 MED ORDER — RIBOCICLIB SUCC (600 MG DOSE) 200 MG PO TBPK: ORAL_TABLET | ORAL | 12 refills | Status: DC

## 2021-06-28 ENCOUNTER — Other Ambulatory Visit: Payer: Self-pay

## 2021-06-28 ENCOUNTER — Inpatient Hospital Stay: Payer: Medicare Other

## 2021-06-28 ENCOUNTER — Other Ambulatory Visit: Payer: Self-pay | Admitting: *Deleted

## 2021-06-28 ENCOUNTER — Inpatient Hospital Stay: Payer: Medicare Other | Attending: Oncology

## 2021-06-28 ENCOUNTER — Other Ambulatory Visit: Payer: Self-pay | Admitting: Oncology

## 2021-06-28 VITALS — BP 141/84 | HR 112 | Temp 98.0°F | Resp 18

## 2021-06-28 DIAGNOSIS — C7800 Secondary malignant neoplasm of unspecified lung: Secondary | ICD-10-CM

## 2021-06-28 DIAGNOSIS — C7951 Secondary malignant neoplasm of bone: Secondary | ICD-10-CM | POA: Diagnosis not present

## 2021-06-28 DIAGNOSIS — C50911 Malignant neoplasm of unspecified site of right female breast: Secondary | ICD-10-CM

## 2021-06-28 DIAGNOSIS — C50411 Malignant neoplasm of upper-outer quadrant of right female breast: Secondary | ICD-10-CM | POA: Insufficient documentation

## 2021-06-28 DIAGNOSIS — Z170 Estrogen receptor positive status [ER+]: Secondary | ICD-10-CM

## 2021-06-28 DIAGNOSIS — Z5111 Encounter for antineoplastic chemotherapy: Secondary | ICD-10-CM | POA: Insufficient documentation

## 2021-06-28 DIAGNOSIS — Z79899 Other long term (current) drug therapy: Secondary | ICD-10-CM | POA: Insufficient documentation

## 2021-06-28 DIAGNOSIS — I3131 Malignant pericardial effusion in diseases classified elsewhere: Secondary | ICD-10-CM

## 2021-06-28 DIAGNOSIS — C787 Secondary malignant neoplasm of liver and intrahepatic bile duct: Secondary | ICD-10-CM

## 2021-06-28 LAB — CBC WITH DIFFERENTIAL/PLATELET
Abs Immature Granulocytes: 0.03 10*3/uL (ref 0.00–0.07)
Basophils Absolute: 0.1 10*3/uL (ref 0.0–0.1)
Basophils Relative: 2 %
Eosinophils Absolute: 0.1 10*3/uL (ref 0.0–0.5)
Eosinophils Relative: 1 %
HCT: 40.8 % (ref 36.0–46.0)
Hemoglobin: 13.7 g/dL (ref 12.0–15.0)
Immature Granulocytes: 1 %
Lymphocytes Relative: 11 %
Lymphs Abs: 0.5 10*3/uL — ABNORMAL LOW (ref 0.7–4.0)
MCH: 34.8 pg — ABNORMAL HIGH (ref 26.0–34.0)
MCHC: 33.6 g/dL (ref 30.0–36.0)
MCV: 103.6 fL — ABNORMAL HIGH (ref 80.0–100.0)
Monocytes Absolute: 0.6 10*3/uL (ref 0.1–1.0)
Monocytes Relative: 13 %
Neutro Abs: 3.3 10*3/uL (ref 1.7–7.7)
Neutrophils Relative %: 72 %
Platelets: 170 10*3/uL (ref 150–400)
RBC: 3.94 MIL/uL (ref 3.87–5.11)
RDW: 14.6 % (ref 11.5–15.5)
WBC: 4.6 10*3/uL (ref 4.0–10.5)
nRBC: 0 % (ref 0.0–0.2)

## 2021-06-28 LAB — COMPREHENSIVE METABOLIC PANEL
ALT: 10 U/L (ref 0–44)
AST: 14 U/L — ABNORMAL LOW (ref 15–41)
Albumin: 3.5 g/dL (ref 3.5–5.0)
Alkaline Phosphatase: 101 U/L (ref 38–126)
Anion gap: 10 (ref 5–15)
BUN: 20 mg/dL (ref 8–23)
CO2: 21 mmol/L — ABNORMAL LOW (ref 22–32)
Calcium: 9.6 mg/dL (ref 8.9–10.3)
Chloride: 108 mmol/L (ref 98–111)
Creatinine, Ser: 1.28 mg/dL — ABNORMAL HIGH (ref 0.44–1.00)
GFR, Estimated: 45 mL/min — ABNORMAL LOW (ref >60)
Glucose, Bld: 128 mg/dL — ABNORMAL HIGH (ref 70–99)
Potassium: 4.7 mmol/L (ref 3.5–5.1)
Sodium: 139 mmol/L (ref 135–145)
Total Bilirubin: 0.3 mg/dL (ref 0.3–1.2)
Total Protein: 7.5 g/dL (ref 6.5–8.1)

## 2021-06-28 MED ORDER — DENOSUMAB 120 MG/1.7ML ~~LOC~~ SOLN
120.0000 mg | Freq: Once | SUBCUTANEOUS | Status: AC
  Administered 2021-06-28: 120 mg via SUBCUTANEOUS
  Filled 2021-06-28: qty 1.7

## 2021-06-28 MED ORDER — FULVESTRANT 250 MG/5ML IM SOSY
500.0000 mg | PREFILLED_SYRINGE | Freq: Once | INTRAMUSCULAR | Status: AC
  Administered 2021-06-28: 500 mg via INTRAMUSCULAR
  Filled 2021-06-28: qty 10

## 2021-06-28 NOTE — Patient Instructions (Signed)
Fulvestrant injection °What is this medication? °FULVESTRANT (ful VES trant) blocks the effects of estrogen. It is used to treat breast cancer. °This medicine may be used for other purposes; ask your health care provider or pharmacist if you have questions. °COMMON BRAND NAME(S): FASLODEX °What should I tell my care team before I take this medication? °They need to know if you have any of these conditions: °bleeding disorders °liver disease °low blood counts, like low white cell, platelet, or red cell counts °an unusual or allergic reaction to fulvestrant, other medicines, foods, dyes, or preservatives °pregnant or trying to get pregnant °breast-feeding °How should I use this medication? °This medicine is for injection into a muscle. It is usually given by a health care professional in a hospital or clinic setting. °Talk to your pediatrician regarding the use of this medicine in children. Special care may be needed. °Overdosage: If you think you have taken too much of this medicine contact a poison control center or emergency room at once. °NOTE: This medicine is only for you. Do not share this medicine with others. °What if I miss a dose? °It is important not to miss your dose. Call your doctor or health care professional if you are unable to keep an appointment. °What may interact with this medication? °medicines that treat or prevent blood clots like warfarin, enoxaparin, dalteparin, apixaban, dabigatran, and rivaroxaban °This list may not describe all possible interactions. Give your health care provider a list of all the medicines, herbs, non-prescription drugs, or dietary supplements you use. Also tell them if you smoke, drink alcohol, or use illegal drugs. Some items may interact with your medicine. °What should I watch for while using this medication? °Your condition will be monitored carefully while you are receiving this medicine. You will need important blood work done while you are taking this  medicine. °Do not become pregnant while taking this medicine or for at least 1 year after stopping it. Women of child-bearing potential will need to have a negative pregnancy test before starting this medicine. Women should inform their doctor if they wish to become pregnant or think they might be pregnant. There is a potential for serious side effects to an unborn child. Men should inform their doctors if they wish to father a child. This medicine may lower sperm counts. Talk to your health care professional or pharmacist for more information. Do not breast-feed an infant while taking this medicine or for 1 year after the last dose. °What side effects may I notice from receiving this medication? °Side effects that you should report to your doctor or health care professional as soon as possible: °allergic reactions like skin rash, itching or hives, swelling of the face, lips, or tongue °feeling faint or lightheaded, falls °pain, tingling, numbness, or weakness in the legs °signs and symptoms of infection like fever or chills; cough; flu-like symptoms; sore throat °vaginal bleeding °Side effects that usually do not require medical attention (report to your doctor or health care professional if they continue or are bothersome): °aches, pains °constipation °diarrhea °headache °hot flashes °nausea, vomiting °pain at site where injected °stomach pain °This list may not describe all possible side effects. Call your doctor for medical advice about side effects. You may report side effects to FDA at 1-800-FDA-1088. °Where should I keep my medication? °This drug is given in a hospital or clinic and will not be stored at home. °NOTE: This sheet is a summary. It may not cover all possible information. If you have   questions about this medicine, talk to your doctor, pharmacist, or health care provider. ° °Denosumab injection °What is this medication? °DENOSUMAB (den oh sue mab) slows bone breakdown. Prolia is used to treat  osteoporosis in women after menopause and in men, and in people who are taking corticosteroids for 6 months or more. Xgeva is used to treat a high calcium level due to cancer and to prevent bone fractures and other bone problems caused by multiple myeloma or cancer bone metastases. Xgeva is also used to treat giant cell tumor of the bone. °This medicine may be used for other purposes; ask your health care provider or pharmacist if you have questions. °COMMON BRAND NAME(S): Prolia, XGEVA °What should I tell my care team before I take this medication? °They need to know if you have any of these conditions: °dental disease °having surgery or tooth extraction °infection °kidney disease °low levels of calcium or Vitamin D in the blood °malnutrition °on hemodialysis °skin conditions or sensitivity °thyroid or parathyroid disease °an unusual reaction to denosumab, other medicines, foods, dyes, or preservatives °pregnant or trying to get pregnant °breast-feeding °How should I use this medication? °This medicine is for injection under the skin. It is given by a health care professional in a hospital or clinic setting. °A special MedGuide will be given to you before each treatment. Be sure to read this information carefully each time. °For Prolia, talk to your pediatrician regarding the use of this medicine in children. Special care may be needed. For Xgeva, talk to your pediatrician regarding the use of this medicine in children. While this drug may be prescribed for children as young as 13 years for selected conditions, precautions do apply. °Overdosage: If you think you have taken too much of this medicine contact a poison control center or emergency room at once. °NOTE: This medicine is only for you. Do not share this medicine with others. °What if I miss a dose? °It is important not to miss your dose. Call your doctor or health care professional if you are unable to keep an appointment. °What may interact with this  medication? °Do not take this medicine with any of the following medications: °other medicines containing denosumab °This medicine may also interact with the following medications: °medicines that lower your chance of fighting infection °steroid medicines like prednisone or cortisone °This list may not describe all possible interactions. Give your health care provider a list of all the medicines, herbs, non-prescription drugs, or dietary supplements you use. Also tell them if you smoke, drink alcohol, or use illegal drugs. Some items may interact with your medicine. °What should I watch for while using this medication? °Visit your doctor or health care professional for regular checks on your progress. Your doctor or health care professional may order blood tests and other tests to see how you are doing. °Call your doctor or health care professional for advice if you get a fever, chills or sore throat, or other symptoms of a cold or flu. Do not treat yourself. This drug may decrease your body's ability to fight infection. Try to avoid being around people who are sick. °You should make sure you get enough calcium and vitamin D while you are taking this medicine, unless your doctor tells you not to. Discuss the foods you eat and the vitamins you take with your health care professional. °See your dentist regularly. Brush and floss your teeth as directed. Before you have any dental work done, tell your dentist you are receiving   this medicine. °Do not become pregnant while taking this medicine or for 5 months after stopping it. Talk with your doctor or health care professional about your birth control options while taking this medicine. Women should inform their doctor if they wish to become pregnant or think they might be pregnant. There is a potential for serious side effects to an unborn child. Talk to your health care professional or pharmacist for more information. °What side effects may I notice from receiving this  medication? °Side effects that you should report to your doctor or health care professional as soon as possible: °allergic reactions like skin rash, itching or hives, swelling of the face, lips, or tongue °bone pain °breathing problems °dizziness °jaw pain, especially after dental work °redness, blistering, peeling of the skin °signs and symptoms of infection like fever or chills; cough; sore throat; pain or trouble passing urine °signs of low calcium like fast heartbeat, muscle cramps or muscle pain; pain, tingling, numbness in the hands or feet; seizures °unusual bleeding or bruising °unusually weak or tired °Side effects that usually do not require medical attention (report to your doctor or health care professional if they continue or are bothersome): °constipation °diarrhea °headache °joint pain °loss of appetite °muscle pain °runny nose °tiredness °upset stomach °This list may not describe all possible side effects. Call your doctor for medical advice about side effects. You may report side effects to FDA at 1-800-FDA-1088. °Where should I keep my medication? °This medicine is only given in a clinic, doctor's office, or other health care setting and will not be stored at home. °NOTE: This sheet is a summary. It may not cover all possible information. If you have questions about this medicine, talk to your doctor, pharmacist, or health care provider. °© 2022 [employer] (2017-11-21 00:00:00) ° °© 2022 [employer] (2017-10-28 00:00:00) ° °

## 2021-07-05 DIAGNOSIS — Z20822 Contact with and (suspected) exposure to covid-19: Secondary | ICD-10-CM | POA: Diagnosis not present

## 2021-07-12 ENCOUNTER — Encounter: Payer: Self-pay | Admitting: Oncology

## 2021-07-26 ENCOUNTER — Encounter: Payer: Self-pay | Admitting: Adult Health

## 2021-07-26 ENCOUNTER — Other Ambulatory Visit: Payer: Self-pay

## 2021-07-26 ENCOUNTER — Inpatient Hospital Stay: Payer: Medicare Other

## 2021-07-26 ENCOUNTER — Inpatient Hospital Stay (HOSPITAL_BASED_OUTPATIENT_CLINIC_OR_DEPARTMENT_OTHER): Payer: Medicare Other | Admitting: Adult Health

## 2021-07-26 VITALS — BP 126/65 | HR 108 | Temp 97.2°F | Resp 18 | Wt 153.5 lb

## 2021-07-26 DIAGNOSIS — Z79899 Other long term (current) drug therapy: Secondary | ICD-10-CM | POA: Diagnosis not present

## 2021-07-26 DIAGNOSIS — C50411 Malignant neoplasm of upper-outer quadrant of right female breast: Secondary | ICD-10-CM

## 2021-07-26 DIAGNOSIS — C50911 Malignant neoplasm of unspecified site of right female breast: Secondary | ICD-10-CM

## 2021-07-26 DIAGNOSIS — C7800 Secondary malignant neoplasm of unspecified lung: Secondary | ICD-10-CM

## 2021-07-26 DIAGNOSIS — Z170 Estrogen receptor positive status [ER+]: Secondary | ICD-10-CM

## 2021-07-26 DIAGNOSIS — C7951 Secondary malignant neoplasm of bone: Secondary | ICD-10-CM

## 2021-07-26 DIAGNOSIS — Z5111 Encounter for antineoplastic chemotherapy: Secondary | ICD-10-CM | POA: Diagnosis not present

## 2021-07-26 DIAGNOSIS — C787 Secondary malignant neoplasm of liver and intrahepatic bile duct: Secondary | ICD-10-CM

## 2021-07-26 DIAGNOSIS — I3131 Malignant pericardial effusion in diseases classified elsewhere: Secondary | ICD-10-CM

## 2021-07-26 LAB — CBC WITH DIFFERENTIAL/PLATELET
Abs Immature Granulocytes: 0.04 10*3/uL (ref 0.00–0.07)
Basophils Absolute: 0.1 10*3/uL (ref 0.0–0.1)
Basophils Relative: 2 %
Eosinophils Absolute: 0 10*3/uL (ref 0.0–0.5)
Eosinophils Relative: 1 %
HCT: 38.6 % (ref 36.0–46.0)
Hemoglobin: 13.1 g/dL (ref 12.0–15.0)
Immature Granulocytes: 1 %
Lymphocytes Relative: 10 %
Lymphs Abs: 0.4 10*3/uL — ABNORMAL LOW (ref 0.7–4.0)
MCH: 34.9 pg — ABNORMAL HIGH (ref 26.0–34.0)
MCHC: 33.9 g/dL (ref 30.0–36.0)
MCV: 102.9 fL — ABNORMAL HIGH (ref 80.0–100.0)
Monocytes Absolute: 0.7 10*3/uL (ref 0.1–1.0)
Monocytes Relative: 16 %
Neutro Abs: 3.3 10*3/uL (ref 1.7–7.7)
Neutrophils Relative %: 70 %
Platelets: 178 10*3/uL (ref 150–400)
RBC: 3.75 MIL/uL — ABNORMAL LOW (ref 3.87–5.11)
RDW: 14.6 % (ref 11.5–15.5)
WBC: 4.6 10*3/uL (ref 4.0–10.5)
nRBC: 0 % (ref 0.0–0.2)

## 2021-07-26 LAB — COMPREHENSIVE METABOLIC PANEL
ALT: 8 U/L (ref 0–44)
AST: 12 U/L — ABNORMAL LOW (ref 15–41)
Albumin: 4.1 g/dL (ref 3.5–5.0)
Alkaline Phosphatase: 77 U/L (ref 38–126)
Anion gap: 8 (ref 5–15)
BUN: 20 mg/dL (ref 8–23)
CO2: 21 mmol/L — ABNORMAL LOW (ref 22–32)
Calcium: 9.4 mg/dL (ref 8.9–10.3)
Chloride: 109 mmol/L (ref 98–111)
Creatinine, Ser: 1.19 mg/dL — ABNORMAL HIGH (ref 0.44–1.00)
GFR, Estimated: 49 mL/min — ABNORMAL LOW (ref >60)
Glucose, Bld: 107 mg/dL — ABNORMAL HIGH (ref 70–99)
Potassium: 4.5 mmol/L (ref 3.5–5.1)
Sodium: 138 mmol/L (ref 135–145)
Total Bilirubin: 0.3 mg/dL (ref 0.3–1.2)
Total Protein: 7.3 g/dL (ref 6.5–8.1)

## 2021-07-26 MED ORDER — DENOSUMAB 120 MG/1.7ML ~~LOC~~ SOLN
120.0000 mg | Freq: Once | SUBCUTANEOUS | Status: AC
  Administered 2021-07-26: 14:00:00 120 mg via SUBCUTANEOUS
  Filled 2021-07-26: qty 1.7

## 2021-07-26 MED ORDER — FULVESTRANT 250 MG/5ML IM SOSY
500.0000 mg | PREFILLED_SYRINGE | Freq: Once | INTRAMUSCULAR | Status: AC
  Administered 2021-07-26: 14:00:00 500 mg via INTRAMUSCULAR
  Filled 2021-07-26: qty 10

## 2021-07-26 NOTE — Progress Notes (Signed)
[facility]  [phone] [phone]) [phone]    ID: Barbara Parks DOB: 07-Nov-1950  MR#: [id number]  MWN#:[id number]  Patient Care Team: [name], MD as PCP - General (Family Medicine) Jerline Pain, MD (Cardiology) Gaye Pollack, MD (Cardiothoracic Surgery) Magrinat, Virgie Dad, MD as Consulting Physician (Oncology) Raina Mina, RPH-CPP (Pharmacist) Renaldo Harrison, MD as Referring Physician (Radiation Oncology) OTHER MD: Dr Erroll Luna- Merlene Laughter, DDS; Gaynelle Arabian MD   CHIEF COMPLAINT: metastatic breast cancer, formerly on BOLERO study (s/p right mastectomy)  CURRENT TREATMENT: Fulvestrant, ribociclib, denosumab/Xgeva   INTERVAL HISTORY: Barbara Parks returns today for follow-up and treatment of her metastatic breast cancer.  Barbara Parks continues on abemaciclib 3 times a day for 3 weeks on and 1 week off.  She notes that she is tolerating this treatment quite well.  She has occasional fatigue.  Barbara Parks went up [place] to visit her sister and they got snowed in.  She notes that she is about 5 pounds due to her sister feeding her lots of sweets over the holidays.  Barbara Parks continues to receive fulvestrant every 4 weeks and tolerates this well.  She notes that with her November 2 injection she received it a bit fast and felt dizzy and lightheaded afterwards and felt unwell for the next 6 hours.  She only had this on this 1 occasion where the rate seemed a little bit fast and has not had it before this incident or since then.  She continues to receive the Xgeva without any difficulty.  She has no dental concerns.   REVIEW OF SYSTEMS: Review of Systems  Constitutional:  Positive for fatigue. Negative for appetite change, chills, fever and unexpected weight change.  HENT:   Negative for hearing loss, lump/mass and trouble swallowing.   Eyes:  Negative for eye problems and icterus.  Respiratory:  Negative for chest tightness, cough and shortness of breath.    Cardiovascular:  Negative for chest pain, leg swelling and palpitations.  Gastrointestinal:  Negative for abdominal distention, abdominal pain, constipation, diarrhea, nausea and vomiting.  Endocrine: Negative for hot flashes.  Genitourinary:  Negative for difficulty urinating.   Musculoskeletal:  Negative for arthralgias.  Skin:  Negative for itching and rash.  Neurological:  Negative for dizziness, extremity weakness, headaches and numbness.  Hematological:  Negative for adenopathy. Does not bruise/bleed easily.  Psychiatric/Behavioral:  Negative for depression. The patient is not nervous/anxious.    COVID 19 VACCINATION STATUS: Refuses vaccination   BREAST CANCER HISTORY: From Dr. Collier [name]'s original intake note 04/13/2004:  "This woman has been in good health all of her life. She recently moved from [place] to work here.  She palpated a mass at about the 12 oclock position in mid-July. She has not noticed any nipple retraction or skin changes.  She was seen by her primary care doctor who subsequently referred her for a mammogram.  Mammogram was performed on 03/01/04. This demonstrated a spiculated 2 cm mass at the 12 oclock position in the right breast.  Upper outer quadrant of the left breast shows some distortion.   Physical exam at that time showed a firm, nontender nodule at the 12 oclock position in the right breast, 5 cm from the nipple.  Ultrasound of this area showed a hypoechoic ill-defined mass, measuring 2.2 x 1.3 x 1.4 cm.  Physical exam of the left breast showed general vague thickening, upper outer quadrant of the left breast with a discrete palpable mass. The ultrasound performed showed a single  hypoechoic ill-defined nodule at the 1 oclock position, measuring 7 x 5 x 6 mm.    She had biopsies of both lesions on 03/02/03.  Needle core biopsy of the lesion on the right breast revealed invasive mammary carcinoma.  Needle core biopsy of the left breast showed a complex  fibroadenoma.  Prognostic panel of the lesion on the right breast showed it to be ER positive at 73%, PR positive at 90% and proliferative index 9%, HER-2 was 1+.  Patient was referred to Dr. Annamaria Boots, who performed a simple mastectomy with sentinel lymph node evaluation on 03/22/04.  Final pathology showed this to be an invasive ductal carcinoma  with lobular features, measuring 2.2 cm, grade 2 of 3.  Margins negative for carcinoma.  Invasive ductal carcinoma was extended to involve deep dermis of the nipple.  Lymphovascular invasion was identified.  Total of 4 sentinel lymph nodes were evaluated.  Touch imprints at the time of the OR was felt to be negative.  Subsequent evaluation showed a 5 mm focus of metastatic carcinoma in one of the four lymph nodes on microscopic after sectioning.  There was extracapsular extension  of one lymph node as well. The remaining three lymph nodes were all negative."  The patient's subsequent history is detailed above.   PAST MEDICAL HISTORY: Past Medical History:  Diagnosis Date   Arthritis    left hip   Asthma    breast ca 2005   breast/chemo R mastectomy   Breast cancer ([place])    Dermatitis    Diabetes mellitus without complication ([place])    GERD (gastroesophageal reflux disease)    Hypercholesteremia    Metastasis to lung ([place]) dx'd 08/2011   Osteopenia due to cancer therapy 09/09/2013   Palpitations    Personal history of chemotherapy    Shortness of breath     PAST SURGICAL HISTORY: Past Surgical History:  Procedure Laterality Date   BREAST SURGERY  2005   right   CATARACT EXTRACTION W/PHACO Left 01/18/2014   Procedure: CATARACT EXTRACTION PHACO AND INTRAOCULAR LENS PLACEMENT ([place]);  Surgeon: Elta Guadeloupe T. Gershon Crane, MD;  Location: [facility];  Service: Ophthalmology;  Laterality: Left;  CDE 15.79   CATARACT EXTRACTION W/PHACO Right 02/08/2014   Procedure: CATARACT EXTRACTION PHACO AND INTRAOCULAR LENS PLACEMENT ([facility]);  Surgeon: Elta Guadeloupe T. Gershon Crane, MD;  Location: [facility];  Service: Ophthalmology;  Laterality: Right;  CDE 4.41   CHEST TUBE INSERTION  09/11/2011   Procedure: INSERTION PLEURAL DRAINAGE CATHETER;  Surgeon: Gaye Pollack, MD;  Location: [facility];  Service: Thoracic;  Laterality: Right;   MASTECTOMY Right    PERICARDIAL WINDOW  09/11/2011   Procedure: PERICARDIAL WINDOW;  Surgeon: Gaye Pollack, MD;  Location: [facility];  Service: Thoracic;  Laterality: N/A;   REMOVAL OF PLEURAL DRAINAGE CATHETER  12/19/2011   Procedure: REMOVAL OF PLEURAL DRAINAGE CATHETER;  Surgeon: Gaye Pollack, MD;  Location: [facility];  Service: Thoracic;  Laterality: Right;  TO BE DONE IN MINOR ROOM, [place] Left 06/07/2015   Procedure: LEFT TOTAL HIP ARTHROPLASTY ANTERIOR APPROACH;  Surgeon: Gaynelle Arabian, MD;  Location: [facility];  Service: Orthopedics;  Laterality: Left;   VIDEO BRONCHOSCOPY  09/11/2011   Procedure: VIDEO BRONCHOSCOPY;  Surgeon: Gaye Pollack, MD;  [facility];  Service: Thoracic;  Laterality: N/A;    FAMILY HISTORY Family History  Problem Relation Age of Onset   Cancer Mother  breast   Heart disease Father    Diabetes Other    Anesthesia problems Neg Hx   The patient's mother was diagnosed with breast cancer at age 85, she died age 71 from congestive heart failure.The patient's father died from heart disease at age 46.  She has one sister alive & well.  Two brothers alive & well, one with diabetes. The patient's sister was also diagnosed with breast cancer, and was tested for the BRCA gene, and was negative. The patient herself has not been tested. There is no history of ovarian cancer in the family   GYNECOLOGIC HISTORY:  No LMP recorded. Patient is postmenopausal. Menarche age 33, the patient is GX P0. She stopped having periods with chemotherapy in 2005. 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  She was a Archivist. She is single, lives alone with her Shitzu-poodle Sammie.. Family is all in the [place] area.     ADVANCED DIRECTIVES: Not in place. At the 02/23/2014 visit the patient was given the appropriate documents to complete and notarize at her discretion. She tells me she is planning to name her sister, Billie Ruddy, as healthcare power of attorney. [name] can be reached at [phone]   HEALTH MAINTENANCE: Social History   Tobacco Use   Smoking status: Former    Packs/day: 1.50    Years: 30.00    Pack years: 45.00    Types: Cigarettes    Quit date: 09/10/2007    Years since quitting: 13.8   Smokeless tobacco: Never  Substance Use Topics   Alcohol use: No   Drug use: No    Colonoscopy:  PAP:  Bone density: 04/09/2018, T score -1.8  Lipid panel:  Allergies  Allergen Reactions   Aspirin     REACTION: upset stomach   Latex Other (See Comments)    Blistering and skin peels off   Nsaids Nausea And Vomiting    Extreme nausea and vomiting    Current Outpatient Medications  Medication Sig Dispense Refill   cholecalciferol (VITAMIN D3) 25 MCG (1000 UT) tablet Take 2 tablets (2,000 Units total) by mouth daily. 180 tablet 4   cyclobenzaprine (FLEXERIL) 10 MG tablet Take 1 tablet (10 mg total) by mouth 2 (two) times daily as needed for muscle spasms. 20 tablet 0   gemfibrozil (LOPID) 600 MG tablet TAKE 1 TABLET (600 MG TOTAL) BY MOUTH 2 (TWO) TIMES DAILY BEFORE A MEAL. 180 tablet 1   loperamide (IMODIUM) 2 MG capsule Take 2 mg by mouth 4 (four) times daily as needed for diarrhea or loose stools. Reported on 11/30/2015     metFORMIN (GLUCOPHAGE-XR) 500 MG 24 hr tablet Take 1 tablet (500 mg total) by mouth 3 (three) times daily before meals.  3   prochlorperazine (COMPAZINE) 10 MG tablet Take 1 tablet (10 mg total) by mouth every 8 (eight) hours as needed for nausea or vomiting. 30 tablet 0   ribociclib succ (KISQALI $RemoveBefor'600MG'liwimXSzRtHd$  DAILY DOSE) 200 MG Therapy  Pack TAKE 3 TABLETS (600 MG TOTAL) BY MOUTH DAILY. TAKE FOR 21 DAYS ON, 7 DAYS OFF, REPEAT EVERY 28 DAYS. START 08/23/2020 63 each 12   No current facility-administered medications for this visit.    OBJECTIVE: White woman in no acute distress Vitals:   07/26/21 1230  BP: 126/65  Pulse: (!) 108  Resp: 18  Temp: (!) 97.2 F (36.2  C)  SpO2: 97%   Wt Readings from Last 3 Encounters:  07/26/21 153 lb 8 oz (69.6 kg)  05/31/21 151 lb 6.4 oz (68.7 kg)  04/05/21 148 lb 12.8 oz (67.5 kg)   Body mass index is 26.35 kg/m.    ECOG FS:1 - Symptomatic but completely ambulatory GENERAL: Patient is a well appearing female in no acute distress HEENT:  Sclerae anicteric.  Oropharynx clear and moist. No ulcerations or evidence of oropharyngeal candidiasis. Neck is supple.  NODES:  No cervical, supraclavicular, or axillary lymphadenopathy palpated.  BREAST EXAM: Her right breast is status post mastectomy with no sign of local recurrence her left breast is benign. LUNGS:  Clear to auscultation bilaterally.  No wheezes or rhonchi. HEART:  Regular rate and rhythm. No murmur appreciated. ABDOMEN:  Soft, nontender.  Positive, normoactive bowel sounds. No organomegaly palpated. MSK:  No focal spinal tenderness to palpation. Full range of motion bilaterally in the upper extremities. EXTREMITIES:  No peripheral edema.   SKIN:  Clear with no obvious rashes or skin changes. No nail dyscrasia. NEURO:  Nonfocal. Well oriented.  Appropriate affect.    LAB  RESULTS:  CMP     Component Value Date/Time   NA 138 07/26/2021 1146   NA 139 07/08/2017 0942   K 4.5 07/26/2021 1146   K 3.8 07/08/2017 0942   CL 109 07/26/2021 1146   CL 109 (H) 12/31/2012 0940   CO2 21 (L) 07/26/2021 1146   CO2 18 (L) 07/08/2017 0942   GLUCOSE 107 (H) 07/26/2021 1146   GLUCOSE 156 (H) 07/08/2017 0942   GLUCOSE 127 (H) 12/31/2012 0940   BUN 20 07/26/2021 1146   BUN 17.7 07/08/2017 0942   CREATININE 1.19 (H) 07/26/2021 1146    CREATININE 1.18 (H) 12/25/2017 0959   CREATININE 1.0 07/08/2017 0942   CALCIUM 9.4 07/26/2021 1146   CALCIUM 10.4 07/08/2017 0942   PROT 7.3 07/26/2021 1146   PROT 7.7 07/08/2017 0942   ALBUMIN 4.1 07/26/2021 1146   ALBUMIN 3.4 (L) 07/08/2017 0942   AST 12 (L) 07/26/2021 1146   AST 50 (H) 12/25/2017 0959   AST 54 (H) 07/08/2017 0942   ALT 8 07/26/2021 1146   ALT 53 12/25/2017 0959   ALT 39 07/08/2017 0942   ALKPHOS 77 07/26/2021 1146   ALKPHOS 232 (H) 07/08/2017 0942   BILITOT 0.3 07/26/2021 1146   BILITOT 0.3 12/25/2017 0959   BILITOT 0.42 07/08/2017 0942   GFRNONAA 49 (L) 07/26/2021 1146   GFRNONAA 47 (L) 12/25/2017 0959   GFRAA 49 (L) 04/05/2020 1504   GFRAA 54 (L) 12/25/2017 0959    I No results found for: SPEP  Lab Results  Component Value Date   WBC 4.6 07/26/2021   NEUTROABS 3.3 07/26/2021   HGB 13.1 07/26/2021   HCT 38.6 07/26/2021   MCV 102.9 (H) 07/26/2021   PLT 178 07/26/2021      Chemistry      Component Value Date/Time   NA 138 07/26/2021 1146   NA 139 07/08/2017 0942   K 4.5 07/26/2021 1146   K 3.8 07/08/2017 0942   CL 109 07/26/2021 1146   CL 109 (H) 12/31/2012 0940   CO2 21 (L) 07/26/2021 1146   CO2 18 (L) 07/08/2017 0942   BUN 20 07/26/2021 1146   BUN 17.7 07/08/2017 0942   CREATININE 1.19 (H) 07/26/2021 1146   CREATININE 1.18 (H) 12/25/2017 0959   CREATININE 1.0 07/08/2017 0942      Component Value Date/Time  CALCIUM 9.4 07/26/2021 1146   CALCIUM 10.4 07/08/2017 0942   ALKPHOS 77 07/26/2021 1146   ALKPHOS 232 (H) 07/08/2017 0942   AST 12 (L) 07/26/2021 1146   AST 50 (H) 12/25/2017 0959   AST 54 (H) 07/08/2017 0942   ALT 8 07/26/2021 1146   ALT 53 12/25/2017 0959   ALT 39 07/08/2017 0942   BILITOT 0.3 07/26/2021 1146   BILITOT 0.3 12/25/2017 0959   BILITOT 0.42 07/08/2017 0942       Lab Results  Component Value Date   LABCA2 58 (H) 09/14/2012    No components found for: [id number]  No results for input(s): INR in the  last 168 hours.  Urinalysis    Component Value Date/Time   COLORURINE YELLOW 11/25/2017 0901   APPEARANCEUR HAZY (A) 11/25/2017 0901   LABSPEC 1.017 11/25/2017 0901   LABSPEC 1.030 07/13/2015 0841   PHURINE 5.0 11/25/2017 0901   GLUCOSEU NEGATIVE 11/25/2017 0901   GLUCOSEU Negative 07/13/2015 0841   HGBUR MODERATE (A) 11/25/2017 0901   BILIRUBINUR NEGATIVE 11/25/2017 0901   BILIRUBINUR Negative 07/13/2015 0841   KETONESUR NEGATIVE 11/25/2017 0901   PROTEINUR 100 (A) 11/25/2017 0901   UROBILINOGEN 0.2 07/13/2015 0841   NITRITE NEGATIVE 11/25/2017 0901   LEUKOCYTESUR NEGATIVE 11/25/2017 0901   LEUKOCYTESUR Negative 07/13/2015 0841    STUDIES: No results found.   ASSESSMENT: 70 y.o. [place], [place] woman with stage IV breast cancer, on BOLERO-4 trial  (1) status post right mastectomy and sentinel lymph node sampling 03/22/2004 for a right upper-outer quadrant pT2 pN1, stage IIB invasive ductal carcinoma with lobular features, grade 2, estrogen receptor and progesterone receptor positive, HER-2 negative, with an MIB-1 of 9% ([id number] and [id number])  (2) addition all right axillary lymph node sampling 05/07/2004 showed 2 benign lymph nodes (4 lymph nodes previously removed, so total was one positive lymph node out of 6; [id number])  (3) the patient was evaluated by radiation oncology; no postmastectomy radiation recommended  (4) adjuvant chemotherapy with dose dense doxorubicin and cyclophosphamide x4 cycles (first cycle delayed one week) followed by dose dense paclitaxel x4 was completed 09/18/2004  (5) tamoxifen started March 2006, discontinued 2009  METASTATIC DISEASE: February 2013 (6) presenting with a large pericardial effusion, large right pleural effusion and possible right middle lobe bronchial obstruction February 2013, status post pericardial window placement, fiberoptic bronchoscopy and right Pleurx placement 09/11/2011, with biopsy of the bronchus intermedius and pericardium  positive for metastatic breast cancer, estrogen receptor 91% positive with moderate staining intensity, progesterone receptor 100% positive with strong staining intensity, with an MIB-1 of 35%, and no HER-2 amplification, the signals ratio being 1.37 (SZA 13-721)  (7) enrolled in BOLERO-4 trial 10/10/2011, receiving letrozole and everolimus  (a) two small areas of enhancement in the cerebellum noted by brain MRI 10/03/2011 were no longer apparent on repeat MRI 08/21/2012-- most recent brain MRI 04/01/2014 showed no evidence of intracranial metastatic disease  (b) sclerotic lesions in left iliac bone and sacrum have not been biopsied; stable; to start zolendronate after patient updates her dental care (extraction planned)--never started  (c) RLL lung nodule, stable (rescanned 07/12/2014 and 08/11/2014) R hilar and subcarinal lymph nodes: stable  (d) CT of the chest:03/25/2018, stable  (e) CT Angio 12/12/2018 stable  (f) off BOLERO trial (trial ended) as of July 2020, but continuing on study drugs  (g) letrozole and everolimus discontinued January 2021 with disease progression  (8) additional problems:  (a) hepatic steatosis  (b) COPD/ emphysema/ asthma  (c) advanced  L hip osteoarthritis, status post left total hip replacement 06/07/2015  (d) aortoiliac atherosclerosis  (e) dental evaluation pending w possible dental extractions  (f) likely thalassemia trait  (g) hyperlipidemia  (9) Bone density concerns: DEXA scan 08/11/2013 was normal  (a) repeat DEXA scan 04/09/2018 shows a T score of -1.8, osteopenia  (10) disease monitoring:  (a) PET scan 12/22/2018 shows no hypermetabolic soft tissue metastases but multifocal bony metastasis  (b) denosumab/Xgeva started on 02/25/2019, repeated every 28 days (delayed due to dental extraction healing issues)  (c) bone scan 05/13/2019 serves as baseline study with increased activity in the right scapular, lower ribs and right femoral head (stable compared to  PET scan of May 2020  (d) bone scan 07/09/2019 stable  (e) CT chest 07/09/2019 shows stable lung lesions but emerging left liver lobe 0.7 cm lesion  (f) liver MRI 08/03/2019 shows approximately 8 liver lesions, the largest measuring 1.8 cm.  (g) liver biopsy 08/06/2019 confirms metastatic breast cancer, estrogen and progesterone receptor positive, with an MIB-1 of 5% and HER-2 not amplified  (h) chest CT and bone scan 11/11/2019 essentially stable  (i) liver MRI 02/01/2020 stable to improved  (j) chest CT and bone scan 04/26/2020 stable  (Continued under #12)  (11) denosumab/Xgeva begun 02/25/2019 repeated every 4 weeks  (12) fulvestrant started 08/24/2019, repeated every 4 weeks  (a) palbociclib 125 mg daily 21 days on 7 days off started 08/25/2019  (b) chest CT 07/24/2020 stable  (c) liver MRI 07/25/2020 shows mild progression in measurable disease  (d) palbociclib discontinued and ribociclib substituted starting 08/22/2020   (i) QTc on 08/22/2020 was 430   (ii) QTc on 10/17/2020 was 441   (iii) QTc on 05/31/2021 was 441  (e) liver MRI 04/1 02/22/2021 shows stable disease   (f) liver MRI 02/2021 shows stable disease  (g) bone scan 02/21/2021 shows no significant change  (h) liver MRI 05/29/2021 shows stable disease.  (13) foundation 1 requested on liver biopsy from 08/16/2019 shows a stable microsatellite status with 1 mutation/MB, amplification of C11orf30, FGF10 and RICTOR, with mutations in ESR1 and PTEN  (14) palliative radiation (Dr. Lynnette Caffey in [facility]) 02/28/2021 to right hip area   PLAN: Barbara Parks continues on therapy for her metastatic breast cancer with Arava cyclin, fulvestrant, Xgeva.  She continues to tolerate treatment well.  She has no clinical signs of progression of her breast cancer.  We discussed her appetite and exercise.  She notes that now that she is back at home she plans on getting outside a little bit more.  We reviewed that she will see Dr. Lindi Adie in 4 weeks at  that time they will discuss ordering repeat MRI of the liver to evaluate the status of her cancer.  [place] understands the above plan and agrees with it.  She knows to call for any questions or concerns that may arise before her next visit with [place].  Total encounter time 20 minutes.*In face-to-face visit time, chart review, lab review, care coordination, and documentation of the encounter.  Wilber Bihari, NP 07/26/21 12:57 PM Medical Oncology and Hematology [facility] [place], [place] [address] Tel. [phone]    Fax. [phone]    *Total Encounter Time as defined by the Centers for Medicare and Medicaid Services includes, in addition to the face-to-face time of a patient visit (documented in the note above) non-face-to-face time: obtaining and reviewing outside history, ordering and reviewing medications, tests or procedures, care coordination (communications with other health care professionals  or caregivers) and documentation in the medical record.

## 2021-08-07 ENCOUNTER — Telehealth: Payer: Self-pay

## 2021-08-07 NOTE — Telephone Encounter (Signed)
Oral Oncology Patient Advocate Encounter  Met patient in [place] to complete a re-enrollment application for [employer] [facility] [place] ([employer]PA[employer]) in an effort to reduce the patient's out of pocket expense for Kisqali to $0.    Application completed and faxed to [phone].   [employer] phone number for follow up is [phone].   This encounter will be updated until final determination.   [place] Patient Barbara Parks Phone [phone] Fax [phone] 08/07/2021 5:56 PM

## 2021-08-07 NOTE — Telephone Encounter (Signed)
Patient is approved for Kisqali at no cost from [employer] 08/04/21-07/28/22  [employer] uses Rx [place] Patient [place] Phone [phone] Fax [phone] 08/07/2021 5:57 PM

## 2021-08-15 IMAGING — MR MR ABDOMEN WO/W CM
19 series · 48 of 48 positions shown · IV contrast (gadavist)
Comparison: 11/11/2019

CLINICAL DATA: Breast cancer staging, follow-up assessment from
previous MRI.

EXAM:
MRI ABDOMEN WITHOUT AND WITH CONTRAST
TECHNIQUE: Multiplanar multisequence MR imaging of the abdomen was performed
both before and after the administration of intravenous contrast.
CONTRAST:  7mL GADAVIST GADOBUTROL 1 MMOL/ML IV SOLN

[Series 2: haste_cor_mbh · coronal · 6.0mm · 1.56mm/px · 1 of 31 slices shown]
[im 1/31]
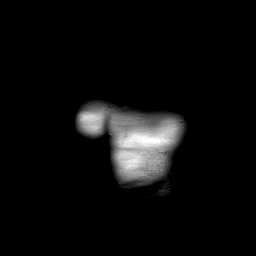

[Series 3: ax_trufi_mbh · axial · 6.0mm · 1.20mm/px · 1 of 43 slices shown]
[im 1/43]
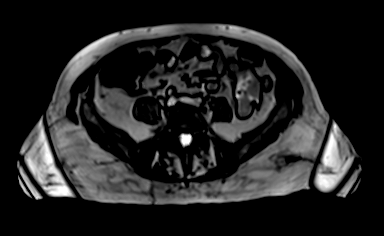

[Series 5: T2 fat-sat · axial · 6.0mm · 1.25mm/px · 1 of 36 slices shown]
[im 1/36]
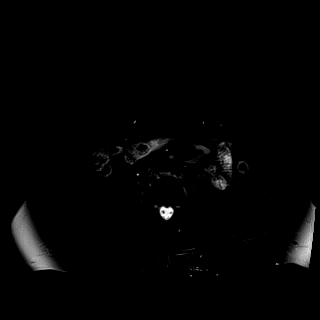

[Series 6: ax_diff_fb_tracew_dfc_mix · axial · 6.0mm · 1.72mm/px · z∈[-248,+47]mm · 3 of 84 slices shown]
[im 1/84]
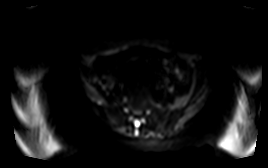
[im 42/84]
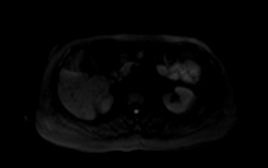
[im 84/84]
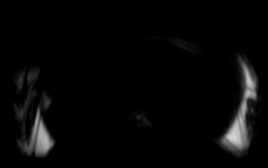

[Series 7: ax_diff_fb_adc_dfc_mix · axial · 6.0mm · 1.72mm/px · z∈[-248,+47]mm · 2 of 42 slices shown]
[im 1/42]
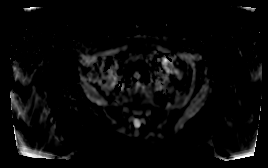
[im 42/42]
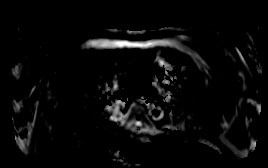

[Series 8: t1_vibe_e-dixon_tra_bh_pre_opp · axial · 3.0mm · 1.44mm/px · z∈[-219,+18]mm · 3 of 80 slices shown]
[im 1/80]
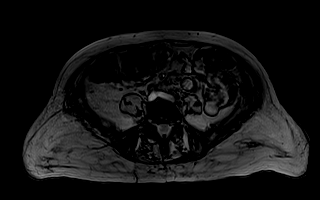
[im 40/80]
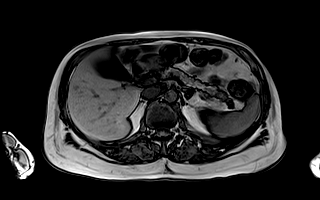
[im 80/80]
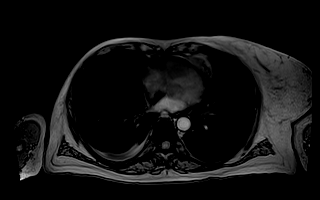

[Series 9: t1_vibe_e-dixon_tra_bh_pre_in · axial · 3.0mm · 1.44mm/px · z∈[-219,+18]mm · 3 of 80 slices shown]
[im 1/80]
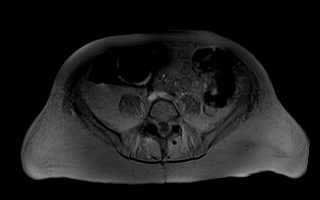
[im 40/80]
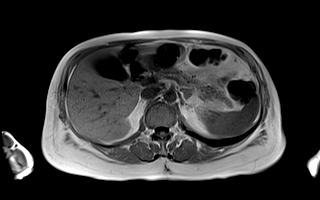
[im 80/80]
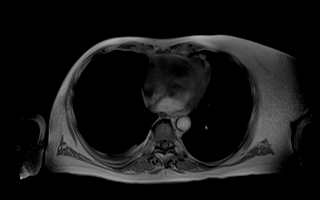

[Series 11: t1_vibe_e-dixon_tra_bh_pre_w · axial · 3.0mm · 1.44mm/px · z∈[-219,+18]mm · 3 of 80 slices shown]
[im 1/80]
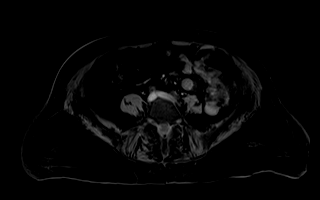
[im 40/80]
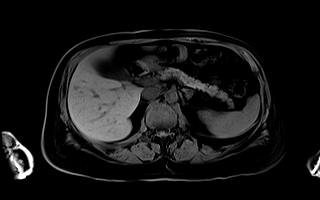
[im 80/80]
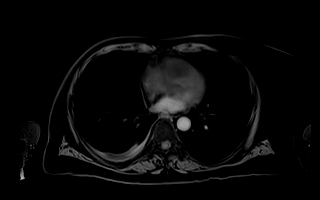

[Series 18: t1_vibe_e-dixon_tra_bh_pre_w_reg · axial · 3.0mm · 1.44mm/px · z∈[-219,+18]mm · 3 of 80 slices shown]
[im 1/80]
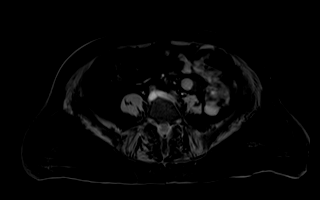
[im 40/80]
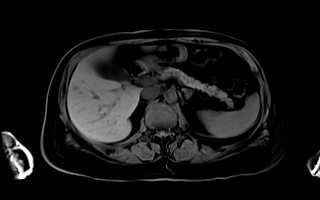
[im 80/80]
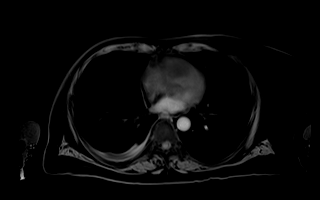

[Series 19: t1_vibe_dixon_tra_bh_arterial_w_reg · axial · 3.0mm · 1.44mm/px · z∈[-219,+18]mm · 3 of 80 slices shown]
[im 1/80]
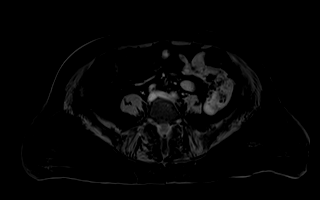
[im 40/80]
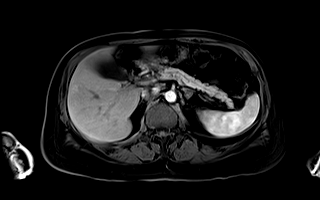
[im 80/80]
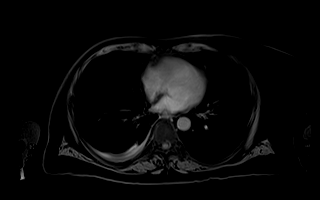

[Series 20: t1_vibe_dixon_tra_bh_arterial_w_sub · axial · 3.0mm · 1.44mm/px · z∈[-219,+18]mm · 3 of 80 slices shown]
[im 1/80]
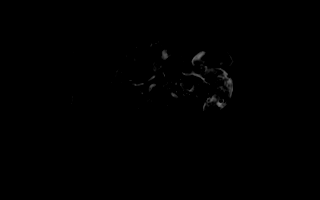
[im 40/80]
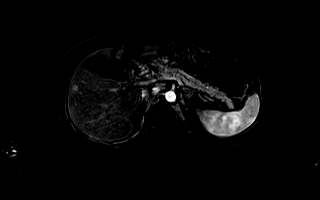
[im 80/80]
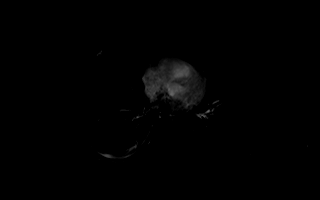

[Series 21: t1_vibe_dixon_tra_bh_venous_w_reg · axial · 3.0mm · 1.44mm/px · z∈[-219,+18]mm · 3 of 80 slices shown]
[im 1/80]
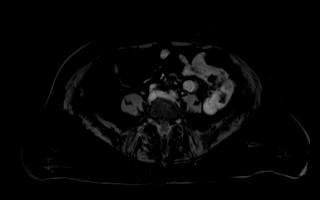
[im 40/80]
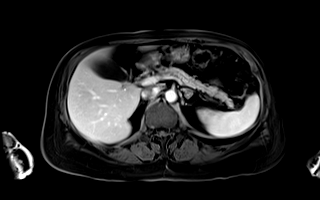
[im 80/80]
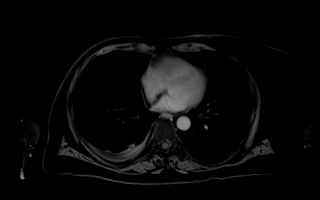

[Series 22: t1_vibe_dixon_tra_bh_venous_w_r_sub · axial · 3.0mm · 1.44mm/px · z∈[-219,+18]mm · 3 of 80 slices shown]
[im 1/80]
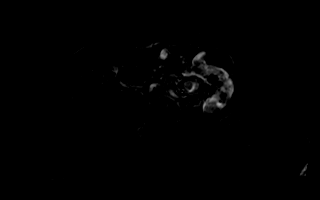
[im 40/80]
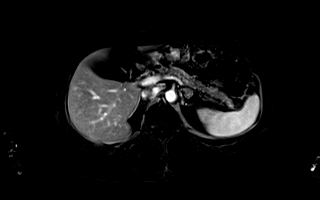
[im 80/80]
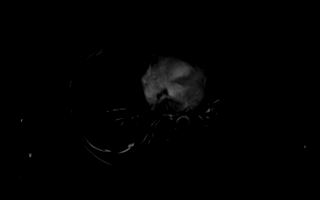

[Series 23: T2 · axial · 6.0mm · 1.56mm/px · 1 of 35 slices shown]
[im 1/35]
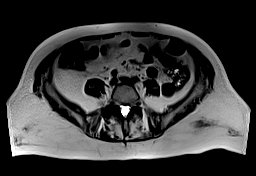

[Series 24: t1_vibe_dixon_tra_bh_delayed_w_reg · axial · 3.0mm · 1.44mm/px · z∈[-219,+18]mm · 3 of 80 slices shown]
[im 1/80]
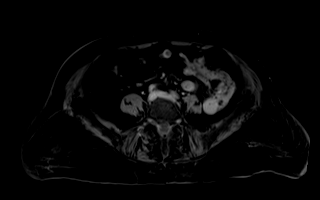
[im 40/80]
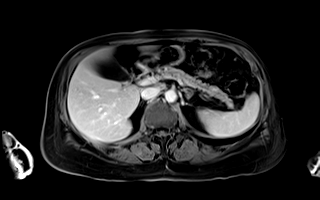
[im 80/80]
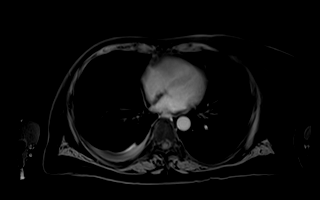

[Series 25: t1_vibe_dixon_tra_bh_delayed_w__sub · axial · 3.0mm · 1.44mm/px · z∈[-219,+18]mm · 3 of 80 slices shown]
[im 1/80]
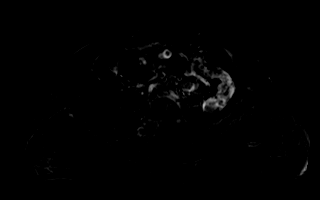
[im 40/80]
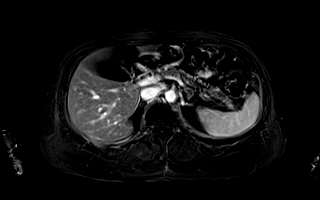
[im 80/80]
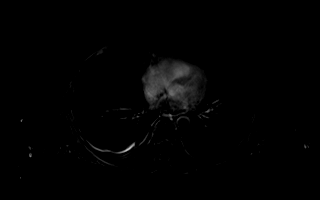

[Series 27: t1_vibe_dixon_cor_bh_post_w · coronal · 3.0mm · 1.56mm/px · 3 of 80 slices shown]
[im 1/80]
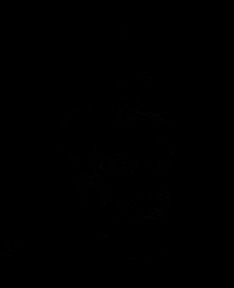
[im 40/80]
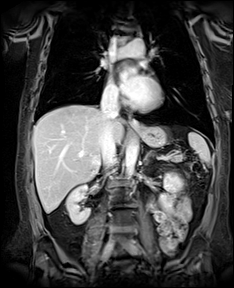
[im 80/80]
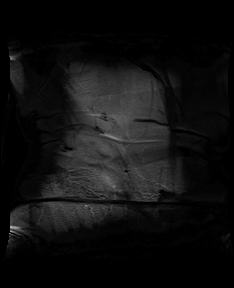

[Series 28: t1_vibe_dixon_tra_bh_3 min_w_reg · axial · 3.0mm · 1.44mm/px · z∈[-219,+18]mm · 3 of 80 slices shown]
[im 1/80]
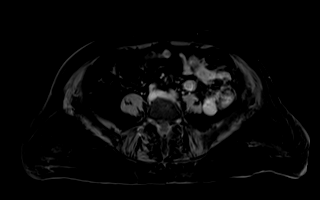
[im 40/80]
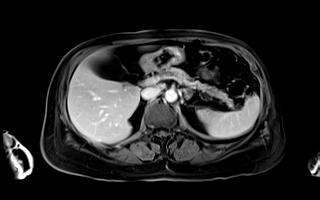
[im 80/80]
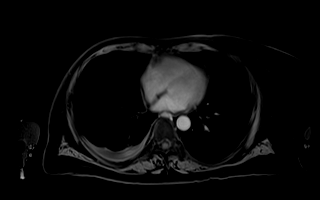

[Series 29: t1_vibe_dixon_tra_bh_3 min_w_re_sub · axial · 3.0mm · 1.44mm/px · z∈[-219,+18]mm · 3 of 80 slices shown]
[im 1/80]
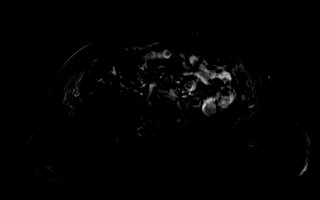
[im 40/80]
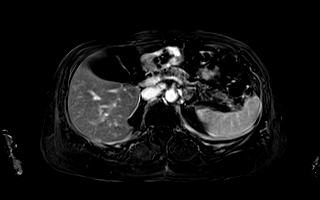
[im 80/80]
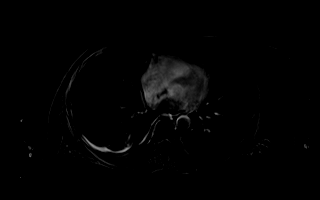

[48 of 48 positions shown; findings below may reference images not displayed]

FINDINGS: Lower chest: Small RIGHT pleural effusion. Volume is quite similar
to the previous chest imaging study from Monday October, 2019.

Post RIGHT mastectomy.

Heart size is normal without pericardial effusion. No new areas of
consolidation. Volume loss at the RIGHT lung base grossly similar
though assessment on MRI is limited.

Hepatobiliary: Multiple hepatic lesions.

Lesions best displayed on diffusion-weighted imaging. (Image 57,
series 6) 14 mm lesion in the lateral segment of the LEFT hepatic
lobe unchanged.

(Image 57, series 6): 12 mm lesion in the posterior LEFT hepatic
lobe, subsegment III unchanged.

Small lesions in the posterior RIGHT hepatic lobe at the margin of
the anterior LEFT liver and along the margin of the RIGHT hemi liver
are similarly stable.

Pancreas: Mild pancreatic ductal distension is unchanged. No
peripancreatic stranding.

Spleen:  Spleen normal in size and contour.

Adrenals/Urinary Tract: Normal adrenal glands. Smooth renal
contours. No hydronephrosis. No suspicious renal lesion.

Stomach/Bowel: Small periampullary duodenal diverticulum.
Moderate-sized duodenal diverticulum at the third-fourth portion of
the duodenum. No acute gastrointestinal process to the extent
evaluated on MRI.

Vascular/Lymphatic: No adenopathy in the retroperitoneum. No
aneurysmal dilation of the abdominal aorta.

Other:  No ascites.

Musculoskeletal: Area of sclerosis along the superior margin of L3
with similar appearance.

Thoracic lesions again with similar appearance, potentially
vertebral hemangioma and endplate degenerative changes.

Rib irregularity and enhancement along the RIGHT posterior ninth rib
is similar accounting for differences in technique, seen at the
upper margin of submitted images.
IMPRESSION: Signs of skeletal metastases and hepatic metastatic lesions are
unchanged. Again follow-up with hepatocyte specific contrast agent
may be helpful for measurement purposes and lesion tracking as
warranted.

Small RIGHT effusion.  Lung base assessment limited on MRI.

## 2021-08-19 ENCOUNTER — Ambulatory Visit (HOSPITAL_COMMUNITY)
Admission: RE | Admit: 2021-08-19 | Discharge: 2021-08-19 | Disposition: A | Discharge status: Home / Self Care | Payer: Medicare Other | Source: Ambulatory Visit | Attending: Oncology | Admitting: Oncology

## 2021-08-19 DIAGNOSIS — C50911 Malignant neoplasm of unspecified site of right female breast: Secondary | ICD-10-CM | POA: Diagnosis not present

## 2021-08-19 DIAGNOSIS — I3131 Malignant pericardial effusion in diseases classified elsewhere: Secondary | ICD-10-CM | POA: Insufficient documentation

## 2021-08-19 DIAGNOSIS — C50411 Malignant neoplasm of upper-outer quadrant of right female breast: Secondary | ICD-10-CM | POA: Diagnosis not present

## 2021-08-19 DIAGNOSIS — C787 Secondary malignant neoplasm of liver and intrahepatic bile duct: Secondary | ICD-10-CM | POA: Diagnosis not present

## 2021-08-19 DIAGNOSIS — C7951 Secondary malignant neoplasm of bone: Secondary | ICD-10-CM | POA: Diagnosis not present

## 2021-08-19 DIAGNOSIS — C7800 Secondary malignant neoplasm of unspecified lung: Secondary | ICD-10-CM | POA: Diagnosis not present

## 2021-08-19 DIAGNOSIS — K7689 Other specified diseases of liver: Secondary | ICD-10-CM | POA: Diagnosis not present

## 2021-08-19 DIAGNOSIS — D3500 Benign neoplasm of unspecified adrenal gland: Secondary | ICD-10-CM | POA: Diagnosis not present

## 2021-08-19 DIAGNOSIS — Z170 Estrogen receptor positive status [ER+]: Secondary | ICD-10-CM | POA: Diagnosis not present

## 2021-08-19 DIAGNOSIS — C50919 Malignant neoplasm of unspecified site of unspecified female breast: Secondary | ICD-10-CM | POA: Diagnosis not present

## 2021-08-19 MED ORDER — GADOBUTROL 1 MMOL/ML IV SOLN
7.0000 mL | Freq: Once | INTRAVENOUS | Status: AC | PRN
  Administered 2021-08-19: 7 mL via INTRAVENOUS

## 2021-08-22 NOTE — Assessment & Plan Note (Signed)
Metastatic breast cancer (pericardial effusion, pleural effusion, right middle lobe lung lesion, ER 91%, PR 90%, Ki-67 35%, HER2 negative diagnosed February 2013)  (Prior history of right mastectomy 2005, adjuvant chemo with Adriamycin Cytoxan and Taxol followed by tamoxifen)  Current treatment: Ribociclib, fulvestrant, Xgeva Toxicities:  Scans: 1.  MRI liver 08/19/2021: Multiple liver lesions are stable to minimally enlarged consistent with slightly worsening hepatic metastatic disease (1.4 to 1.6 cm, 1 cm to 1.2 cm, index lesion right lower lobe unchanged 1.1 cm).  Unchanged bone metastases of thoracic and lumbar spine.  Moderate right pleural effusion 2. bone scan 02/23/2021: No change  Recommendation: 1.  Consider switching to abemaciclib versus continuing with regular cyclic with a follow-up scan 2.

## 2021-08-22 NOTE — Progress Notes (Signed)
Patient Care Team: [name], MD as PCP - General (Family Medicine) Jerline Pain, MD (Cardiology) Gaye Pollack, MD (Cardiothoracic Surgery) Magrinat, Virgie Dad, MD as Consulting Physician (Oncology) Raina Mina, RPH-CPP (Pharmacist) Renaldo Harrison, MD as Referring Physician (Radiation Oncology)  DIAGNOSIS:    ICD-10-CM   1. Malignant neoplasm of upper-outer quadrant of right breast in female, estrogen receptor positive ([place])  C50.411    Z17.0       SUMMARY OF ONCOLOGIC HISTORY: Oncology History  Malignant neoplasm of upper-outer quadrant of right breast in female, estrogen receptor positive ([place])  03/22/2004 Initial Diagnosis   right mastectomy and sentinel lymph node sampling 03/22/2004 for a right upper-outer quadrant pT2 pN1, stage IIB invasive ductal carcinoma with lobular features, grade 2, estrogen receptor and progesterone receptor positive, HER-2 negative, with an MIB-1 of 9%   05/2004 - 09/18/2004 Chemotherapy   adjuvant chemotherapy with dose dense doxorubicin and cyclophosphamide x4 cycles (first cycle delayed one week) followed by dose dense paclitaxel x4 was completed 09/18/2004   09/2004 - 2009 Anti-estrogen oral therapy   tamoxifen started March 2006, discontinued 2009   08/2011 Relapse/Recurrence   Metastatic disease:large pericardial effusion, large right pleural effusion and possible right middle lobe bronchial obstruction  status post pericardial window placement, fiberoptic bronchoscopy and right Pleurx placement 09/11/2011, with biopsy of the bronchus intermedius and pericardium positive for metastatic breast cancer, ER 91%, PR 100% p with an MIB-1 of 35%, and no HER-2 amplification, ratio being 1.37   10/10/2011 - 07/2019 Anti-estrogen oral therapy   BOLERO-4 trial 10/10/2011, receiving letrozole and everolimus (stopped for progression)   08/16/2019 Miscellaneous   foundation 1 requested on liver biopsy from 08/16/2019 shows a stable microsatellite  status with 1 mutation/MB, amplification of C11orf30, FGF10 and RICTOR, with mutations in ESR1 and PTEN   08/24/2019 -  Anti-estrogen oral therapy   fulvestrant started 08/24/2019, repeated every 4 weeks Palbociclib 125 mg daily 21 days on 7 days off started 08/25/2019 Switched to ribociclib s starting 08/22/2020   02/28/2021 -  Radiation Therapy   palliative radiation (Dr. Lynnette Caffey in [place]) 02/28/2021 to right hip area     CHIEF COMPLIANT: Follow-up of metastatic breast cancer, formerly on BOLERO study   INTERVAL HISTORY: Barbara Parks is a 71 y.o. with above-mentioned history of metastatic breast cancer, formerly on BOLERO study. She presents to the clinic today for follow-up.  She recently had a MRI of the liver and is here today to discuss results.  She has been tolerating ribociclib fairly well.     ALLERGIES:  is allergic to aspirin, latex, and nsaids.  MEDICATIONS:  Current Outpatient Medications  Medication Sig Dispense Refill   cholecalciferol (VITAMIN D3) 25 MCG (1000 UT) tablet Take 2 tablets (2,000 Units total) by mouth daily. 180 tablet 4   cyclobenzaprine (FLEXERIL) 10 MG tablet Take 1 tablet (10 mg total) by mouth 2 (two) times daily as needed for muscle spasms. 20 tablet 0   gemfibrozil (LOPID) 600 MG tablet TAKE 1 TABLET (600 MG TOTAL) BY MOUTH 2 (TWO) TIMES DAILY BEFORE A MEAL. 180 tablet 1   loperamide (IMODIUM) 2 MG capsule Take 2 mg by mouth 4 (four) times daily as needed for diarrhea or loose stools. Reported on 11/30/2015     metFORMIN (GLUCOPHAGE-XR) 500 MG 24 hr tablet Take 1 tablet (500 mg total) by mouth 3 (three) times daily before meals.  3   prochlorperazine (COMPAZINE) 10 MG tablet Take 1 tablet (10 mg  total) by mouth every 8 (eight) hours as needed for nausea or vomiting. 30 tablet 0   ribociclib succ (KISQALI 600MG DAILY DOSE) 200 MG Therapy Pack TAKE 3 TABLETS (600 MG TOTAL) BY MOUTH DAILY. TAKE FOR 21 DAYS ON, 7 DAYS OFF, REPEAT EVERY 28 DAYS. START  08/23/2020 63 each 12   No current facility-administered medications for this visit.    PHYSICAL EXAMINATION: ECOG PERFORMANCE STATUS: 1 - Symptomatic but completely ambulatory  Vitals:   08/23/21 1142  BP: (!) 143/74  Pulse: (!) 115  Resp: 18  Temp: (!) 97.3 F (36.3 C)  SpO2: 97%   Filed Weights   08/23/21 1142  Weight: 152 lb 12.8 oz (69.3 kg)      LABORATORY DATA:  I have reviewed the data as listed CMP Latest Ref Rng & Units 08/23/2021 07/26/2021 06/28/2021  Glucose 70 - 99 mg/dL 124(H) 107(H) 128(H)  BUN 8 - 23 mg/dL 22 20 20   Creatinine 0.44 - 1.00 mg/dL 1.23(H) 1.19(H) 1.28(H)  Sodium 135 - 145 mmol/L 137 138 139  Potassium 3.5 - 5.1 mmol/L 4.7 4.5 4.7  Chloride 98 - 111 mmol/L 107 109 108  CO2 22 - 32 mmol/L 23 21(L) 21(L)  Calcium 8.9 - 10.3 mg/dL 9.9 9.4 9.6  Total Protein 6.5 - 8.1 g/dL 7.6 7.3 7.5  Total Bilirubin 0.3 - 1.2 mg/dL 0.3 0.3 0.3  Alkaline Phos 38 - 126 U/L 90 77 101  AST 15 - 41 U/L 13(L) 12(L) 14(L)  ALT 0 - 44 U/L 8 8 10     Lab Results  Component Value Date   WBC 4.8 08/23/2021   HGB 13.5 08/23/2021   HCT 40.1 08/23/2021   MCV 103.1 (H) 08/23/2021   PLT 168 08/23/2021   NEUTROABS 3.7 08/23/2021    ASSESSMENT & PLAN:  Malignant neoplasm of upper-outer quadrant of right breast in female, estrogen receptor positive ([place]) Metastatic breast cancer (pericardial effusion, pleural effusion, right middle lobe lung lesion, ER 91%, PR 90%, Ki-67 35%, HER2 negative diagnosed February 2013)  (Prior history of right mastectomy 2005, adjuvant chemo with Adriamycin Cytoxan and Taxol followed by tamoxifen)  Current treatment: Ribociclib, fulvestrant, Xgeva Toxicities:  Scans: 1.  MRI liver 08/19/2021: Multiple liver lesions are stable to minimally enlarged consistent with slightly worsening hepatic metastatic disease (1.4 to 1.6 cm, 1 cm to 1.2 cm, index lesion right lower lobe unchanged 1.1 cm).  Unchanged bone metastases of thoracic and lumbar  spine.  Moderate right pleural effusion 2. bone scan 02/23/2021: No change  Recommendation: 1.  Based on only slight change in the liver nodules, we discussed the pros and cons of switching treatment and decided to continue the Ribociclib for now. 2. obtain a liver MRI in 2 months. She will come monthly for Faslodex injections Bone metastasis: On Xgeva: I recommended moving Xgeva to every 3 months.  I will see her with labs and follow-up in 2 months with a liver MRI.  No orders of the defined types were placed in this encounter.  The patient has a good understanding of the overall plan. she agrees with it. she will call with any problems that may develop before the next visit here.  Total time spent: 60 mins including face to face time and time spent for planning, charting and coordination of care  Rulon Eisenmenger, MD, MPH 08/23/2021  I, Thana Ates, am acting as scribe for Dr. Nicholas Lose.  I have reviewed the above documentation for accuracy and completeness, and I  agree with the above.

## 2021-08-23 ENCOUNTER — Other Ambulatory Visit: Payer: Self-pay

## 2021-08-23 ENCOUNTER — Inpatient Hospital Stay: Payer: Medicare Other

## 2021-08-23 ENCOUNTER — Inpatient Hospital Stay: Payer: Medicare Other | Attending: Oncology

## 2021-08-23 ENCOUNTER — Inpatient Hospital Stay (HOSPITAL_BASED_OUTPATIENT_CLINIC_OR_DEPARTMENT_OTHER): Payer: Medicare Other | Admitting: Hematology and Oncology

## 2021-08-23 VITALS — BP 143/74 | HR 115 | Temp 97.3°F | Resp 18 | Ht 64.0 in | Wt 152.8 lb

## 2021-08-23 DIAGNOSIS — Z79899 Other long term (current) drug therapy: Secondary | ICD-10-CM | POA: Diagnosis not present

## 2021-08-23 DIAGNOSIS — Z5111 Encounter for antineoplastic chemotherapy: Secondary | ICD-10-CM | POA: Diagnosis not present

## 2021-08-23 DIAGNOSIS — C50411 Malignant neoplasm of upper-outer quadrant of right female breast: Secondary | ICD-10-CM | POA: Diagnosis not present

## 2021-08-23 DIAGNOSIS — I3131 Malignant pericardial effusion in diseases classified elsewhere: Secondary | ICD-10-CM

## 2021-08-23 DIAGNOSIS — C50911 Malignant neoplasm of unspecified site of right female breast: Secondary | ICD-10-CM

## 2021-08-23 DIAGNOSIS — M8580 Other specified disorders of bone density and structure, unspecified site: Secondary | ICD-10-CM

## 2021-08-23 DIAGNOSIS — C7951 Secondary malignant neoplasm of bone: Secondary | ICD-10-CM

## 2021-08-23 DIAGNOSIS — Z170 Estrogen receptor positive status [ER+]: Secondary | ICD-10-CM | POA: Diagnosis not present

## 2021-08-23 DIAGNOSIS — C7800 Secondary malignant neoplasm of unspecified lung: Secondary | ICD-10-CM

## 2021-08-23 DIAGNOSIS — C787 Secondary malignant neoplasm of liver and intrahepatic bile duct: Secondary | ICD-10-CM | POA: Diagnosis not present

## 2021-08-23 LAB — COMPREHENSIVE METABOLIC PANEL
ALT: 8 U/L (ref 0–44)
AST: 13 U/L — ABNORMAL LOW (ref 15–41)
Albumin: 4.3 g/dL (ref 3.5–5.0)
Alkaline Phosphatase: 90 U/L (ref 38–126)
Anion gap: 7 (ref 5–15)
BUN: 22 mg/dL (ref 8–23)
CO2: 23 mmol/L (ref 22–32)
Calcium: 9.9 mg/dL (ref 8.9–10.3)
Chloride: 107 mmol/L (ref 98–111)
Creatinine, Ser: 1.23 mg/dL — ABNORMAL HIGH (ref 0.44–1.00)
GFR, Estimated: 47 mL/min — ABNORMAL LOW (ref >60)
Glucose, Bld: 124 mg/dL — ABNORMAL HIGH (ref 70–99)
Potassium: 4.7 mmol/L (ref 3.5–5.1)
Sodium: 137 mmol/L (ref 135–145)
Total Bilirubin: 0.3 mg/dL (ref 0.3–1.2)
Total Protein: 7.6 g/dL (ref 6.5–8.1)

## 2021-08-23 LAB — CBC WITH DIFFERENTIAL/PLATELET
Abs Immature Granulocytes: 0.03 10*3/uL (ref 0.00–0.07)
Basophils Absolute: 0.1 10*3/uL (ref 0.0–0.1)
Basophils Relative: 2 %
Eosinophils Absolute: 0 10*3/uL (ref 0.0–0.5)
Eosinophils Relative: 1 %
HCT: 40.1 % (ref 36.0–46.0)
Hemoglobin: 13.5 g/dL (ref 12.0–15.0)
Immature Granulocytes: 1 %
Lymphocytes Relative: 10 %
Lymphs Abs: 0.5 10*3/uL — ABNORMAL LOW (ref 0.7–4.0)
MCH: 34.7 pg — ABNORMAL HIGH (ref 26.0–34.0)
MCHC: 33.7 g/dL (ref 30.0–36.0)
MCV: 103.1 fL — ABNORMAL HIGH (ref 80.0–100.0)
Monocytes Absolute: 0.4 10*3/uL (ref 0.1–1.0)
Monocytes Relative: 8 %
Neutro Abs: 3.7 10*3/uL (ref 1.7–7.7)
Neutrophils Relative %: 78 %
Platelets: 168 10*3/uL (ref 150–400)
RBC: 3.89 MIL/uL (ref 3.87–5.11)
RDW: 14.6 % (ref 11.5–15.5)
WBC: 4.8 10*3/uL (ref 4.0–10.5)
nRBC: 0 % (ref 0.0–0.2)

## 2021-08-23 MED ORDER — FULVESTRANT 250 MG/5ML IM SOSY
500.0000 mg | PREFILLED_SYRINGE | Freq: Once | INTRAMUSCULAR | Status: AC
  Administered 2021-08-23: 500 mg via INTRAMUSCULAR
  Filled 2021-08-23: qty 10

## 2021-08-23 NOTE — Progress Notes (Signed)
Per MD, pt will not receive xgeva today, only Faslodex. Changing xgeva to q49m

## 2021-09-10 DIAGNOSIS — Z20822 Contact with and (suspected) exposure to covid-19: Secondary | ICD-10-CM | POA: Diagnosis not present

## 2021-09-13 ENCOUNTER — Other Ambulatory Visit: Payer: Self-pay | Admitting: *Deleted

## 2021-09-13 MED ORDER — GEMFIBROZIL 600 MG PO TABS: 600.0000 mg | ORAL_TABLET | Freq: Two times a day (BID) | ORAL | 2 refills | Status: DC

## 2021-09-20 ENCOUNTER — Other Ambulatory Visit: Payer: Self-pay

## 2021-09-20 ENCOUNTER — Inpatient Hospital Stay: Payer: Medicare Other | Attending: Oncology

## 2021-09-20 VITALS — BP 128/69 | HR 114 | Temp 98.3°F | Resp 18

## 2021-09-20 DIAGNOSIS — Z5111 Encounter for antineoplastic chemotherapy: Secondary | ICD-10-CM | POA: Diagnosis not present

## 2021-09-20 DIAGNOSIS — C50411 Malignant neoplasm of upper-outer quadrant of right female breast: Secondary | ICD-10-CM | POA: Insufficient documentation

## 2021-09-20 DIAGNOSIS — C7951 Secondary malignant neoplasm of bone: Secondary | ICD-10-CM | POA: Diagnosis not present

## 2021-09-20 DIAGNOSIS — Z79899 Other long term (current) drug therapy: Secondary | ICD-10-CM | POA: Insufficient documentation

## 2021-09-20 DIAGNOSIS — C50911 Malignant neoplasm of unspecified site of right female breast: Secondary | ICD-10-CM

## 2021-09-20 MED ORDER — FULVESTRANT 250 MG/5ML IM SOSY
500.0000 mg | PREFILLED_SYRINGE | Freq: Once | INTRAMUSCULAR | Status: AC
  Administered 2021-09-20: 500 mg via INTRAMUSCULAR
  Filled 2021-09-20: qty 10

## 2021-10-03 DIAGNOSIS — Z20828 Contact with and (suspected) exposure to other viral communicable diseases: Secondary | ICD-10-CM | POA: Diagnosis not present

## 2021-10-12 ENCOUNTER — Other Ambulatory Visit: Payer: Self-pay

## 2021-10-12 ENCOUNTER — Ambulatory Visit (HOSPITAL_COMMUNITY)
Admission: RE | Admit: 2021-10-12 | Discharge: 2021-10-12 | Disposition: A | Discharge status: Home / Self Care | Payer: Medicare Other | Source: Ambulatory Visit | Attending: Hematology and Oncology | Admitting: Hematology and Oncology

## 2021-10-12 DIAGNOSIS — Z170 Estrogen receptor positive status [ER+]: Secondary | ICD-10-CM | POA: Insufficient documentation

## 2021-10-12 DIAGNOSIS — C787 Secondary malignant neoplasm of liver and intrahepatic bile duct: Secondary | ICD-10-CM | POA: Diagnosis not present

## 2021-10-12 DIAGNOSIS — C50411 Malignant neoplasm of upper-outer quadrant of right female breast: Secondary | ICD-10-CM | POA: Insufficient documentation

## 2021-10-12 DIAGNOSIS — D3500 Benign neoplasm of unspecified adrenal gland: Secondary | ICD-10-CM | POA: Diagnosis not present

## 2021-10-12 DIAGNOSIS — K769 Liver disease, unspecified: Secondary | ICD-10-CM | POA: Diagnosis not present

## 2021-10-12 DIAGNOSIS — C50919 Malignant neoplasm of unspecified site of unspecified female breast: Secondary | ICD-10-CM | POA: Diagnosis not present

## 2021-10-12 MED ORDER — GADOBUTROL 1 MMOL/ML IV SOLN
7.0000 mL | Freq: Once | INTRAVENOUS | Status: AC | PRN
  Administered 2021-10-12: 7 mL via INTRAVENOUS

## 2021-10-17 NOTE — Progress Notes (Signed)
? ?Patient Care Team: ?[name], MD as PCP - General (Family Medicine) ?Jerline Pain, MD (Cardiology) ?Gaye Pollack, MD (Cardiothoracic Surgery) ?Raina Mina, RPH-CPP (Pharmacist) ?Renaldo Harrison, MD as Referring Physician (Radiation Oncology) ?Nicholas Lose, MD as Consulting Physician (Hematology and Oncology) ? ?DIAGNOSIS:  ?Encounter Diagnosis  ?Name Primary?  ? Malignant neoplasm of upper-outer quadrant of right breast in female, estrogen receptor positive ([place])   ? ? ?SUMMARY OF ONCOLOGIC HISTORY: ?Oncology History  ?Malignant neoplasm of upper-outer quadrant of right breast in female, estrogen receptor positive ([place])  ?03/22/2004 Initial Diagnosis  ? right mastectomy and sentinel lymph node sampling 03/22/2004 for a right upper-outer quadrant pT2 pN1, stage IIB invasive ductal carcinoma with lobular features, grade 2, estrogen receptor and progesterone receptor positive, HER-2 negative, with an MIB-1 of 9% ?  ?05/2004 - 09/18/2004 Chemotherapy  ? adjuvant chemotherapy with dose dense doxorubicin and cyclophosphamide x4 cycles (first cycle delayed one week) followed by dose dense paclitaxel x4 was completed 09/18/2004 ?  ?09/2004 - 2009 Anti-estrogen oral therapy  ? tamoxifen started March 2006, discontinued 2009 ?  ?08/2011 Relapse/Recurrence  ? Metastatic disease:large pericardial effusion, large right pleural effusion and possible right middle lobe bronchial obstruction  status post pericardial window placement, fiberoptic bronchoscopy and right Pleurx placement 09/11/2011, with biopsy of the bronchus intermedius and pericardium positive for metastatic breast cancer, ER 91%, PR 100% p with an MIB-1 of 35%, and no HER-2 amplification, ratio being 1.37 ?  ?10/10/2011 - 07/2019 Anti-estrogen oral therapy  ? BOLERO-4 trial 10/10/2011, receiving letrozole and everolimus (stopped for progression) ?  ?08/16/2019 Miscellaneous  ? foundation 1 requested on liver biopsy from 08/16/2019 shows a stable  microsatellite status with 1 mutation/MB, amplification of C11orf30, FGF10 and RICTOR, with mutations in ESR1 and PTEN ?  ?08/24/2019 -  Anti-estrogen oral therapy  ? fulvestrant started 08/24/2019, repeated every 4 weeks ?Palbociclib 125 mg daily 21 days on 7 days off started 08/25/2019 ?Switched to ribociclib s starting 08/22/2020 ?  ?02/28/2021 -  Radiation Therapy  ? palliative radiation (Dr. Lynnette Caffey in [place]) 02/28/2021 to right hip area ?  ? ? ?CHIEF COMPLIANT:Follow-up of metastatic breast cancer,   ? ?INTERVAL HISTORY: Barbara Parks is 71 y.o. with above-mentioned history of metastatic breast cancer, formerly on BOLERO study. She presents to the clinic today for follow-up. She has been on ribociclib and had a liver MRI which showed slow progression of disease and she is here today to discuss the results and to come up with the treatment plan. ? ? ?ALLERGIES:  is allergic to aspirin, latex, and nsaids. ? ?MEDICATIONS:  ?Current Outpatient Medications  ?Medication Sig Dispense Refill  ? abemaciclib (VERZENIO) 100 MG tablet Take 1 tablet (100 mg total) by mouth 2 (two) times daily. Swallow tablets whole. Do not chew, crush, or split tablets before swallowing. 60 tablet 3  ? cholecalciferol (VITAMIN D3) 25 MCG (1000 UT) tablet Take 2 tablets (2,000 Units total) by mouth daily. 180 tablet 4  ? cyclobenzaprine (FLEXERIL) 10 MG tablet Take 1 tablet (10 mg total) by mouth 2 (two) times daily as needed for muscle spasms. 20 tablet 0  ? gemfibrozil (LOPID) 600 MG tablet Take 1 tablet (600 mg total) by mouth 2 (two) times daily before a meal. 180 tablet 2  ? loperamide (IMODIUM) 2 MG capsule Take 2 mg by mouth 4 (four) times daily as needed for diarrhea or loose stools. Reported on 11/30/2015    ? metFORMIN (GLUCOPHAGE-XR) 500 MG 24  hr tablet Take 1 tablet (500 mg total) by mouth 3 (three) times daily before meals.  3  ? prochlorperazine (COMPAZINE) 10 MG tablet Take 1 tablet (10 mg total) by mouth every 8 (eight) hours  as needed for nausea or vomiting. 30 tablet 0  ? ?No current facility-administered medications for this visit.  ? ? ?PHYSICAL EXAMINATION: ?ECOG PERFORMANCE STATUS: 1 - Symptomatic but completely ambulatory ? ?Vitals:  ? 10/18/21 1145  ?BP: 134/68  ?Pulse: (!) 115  ?Resp: 18  ?Temp: 97.8 ?F (36.6 ?C)  ?SpO2: 97%  ? ?Filed Weights  ? 10/18/21 1145  ?Weight: 154 lb 6.4 oz (70 kg)  ? ?  ? ?LABORATORY DATA:  ?I have reviewed the data as listed ? ?  Latest Ref Rng & Units 10/18/2021  ? 11:29 AM 08/23/2021  ? 11:14 AM 07/26/2021  ? 11:46 AM  ?CMP  ?Glucose 70 - 99 mg/dL 93   124   107    ?BUN 8 - 23 mg/dL _0 ?Creatinine 0.44 - 1.00 mg/dL 1.30   1.23   1.19    ?Sodium 135 - 145 mmol/L 136   137   138    ?Potassium 3.5 - 5.1 mmol/L 4.8   4.7   4.5    ?Chloride 98 - 111 mmol/L 106   107   109    ?CO2 22 - 32 mmol/L _1 ?Calcium 8.9 - 10.3 mg/dL 10.0   9.9   9.4    ?Total Protein 6.5 - 8.1 g/dL 7.7   7.6   7.3    ?Total Bilirubin 0.3 - 1.2 mg/dL 0.3   0.3   0.3    ?Alkaline Phos 38 - 126 U/L 88   90   77    ?AST 15 - 41 U/L _2 ?ALT 0 - 44 U/L _3 ? ? ?Lab Results  ?Component Value Date  ? WBC 4.6 10/18/2021  ? HGB 13.6 10/18/2021  ? HCT 40.3 10/18/2021  ? MCV 103.9 (H) 10/18/2021  ? PLT 199 10/18/2021  ? NEUTROABS 3.5 10/18/2021  ? ? ?ASSESSMENT & PLAN:  ?Malignant neoplasm of upper-outer quadrant of right breast in female, estrogen receptor positive ([place]) ?Metastatic breast cancer (pericardial effusion, pleural effusion, right middle lobe lung lesion, ER 91%, PR 90%, Ki-67 35%, HER2 negative diagnosed February 2013) ?  ?(Prior history of right mastectomy 2005, adjuvant chemo with Adriamycin Cytoxan and Taxol followed by tamoxifen) ?  ?Current treatment: Ribociclib, fulvestrant, Xgeva, ribociclib discontinued 10/18/2021 switching to abemaciclib as soon as it becomes available because of progression of disease  ?  ?Scans: ?liver MRI 10/14/2021: Multiple stable to slightly enlarged  hepatic metastatic lesions suggesting minimal progression, unchanged thoracic and lumbar vertebral metastases ? ?  ?Recommendation: ?1. We discussed about switching her from ribociclib to abemaciclib: Start abemaciclib at 100 mg p.o. twice daily and titrated either upwards or downwards based on side effects like diarrhea ?2. monthly for Faslodex injections ?Bone metastasis: On Xgeva: I recommended moving Xgeva to every 3 months.  She tells me that since we moved her Delton See to every 3 months her bone pain is increased.  So we will move the Xgeva to monthly at this time. ? ?Return to clinic in 4 weeks to see Jenny Reichmann. ? ? ? ?Orders Placed This Encounter  ?  Procedures  ? CBC with Differential ([place] Only)  ?  Standing Status:   Standing  ?  Number of Occurrences:   30  ?  Standing Expiration Date:   10/19/2022  ? CMP ([place] only)  ?  Standing Status:   Standing  ?  Number of Occurrences:   30  ?  Standing Expiration Date:   10/19/2022  ? CA 27.29  ?  Standing Status:   Standing  ?  Number of Occurrences:   30  ?  Standing Expiration Date:   10/19/2022  ? ?The patient has a good understanding of the overall plan. she agrees with it. she will call with any problems that may develop before the next visit here. ?Total time spent: 30 mins including face to face time and time spent for planning, charting and co-ordination of care ? ? Harriette Ohara, MD ?10/18/21 ? ? ? I Gardiner Coins am scribing for Dr. Lindi Adie ? ?I have reviewed the above documentation for accuracy and completeness, and I agree with the above. ?  ?

## 2021-10-18 ENCOUNTER — Inpatient Hospital Stay (HOSPITAL_BASED_OUTPATIENT_CLINIC_OR_DEPARTMENT_OTHER): Payer: Medicare Other | Admitting: Hematology and Oncology

## 2021-10-18 ENCOUNTER — Inpatient Hospital Stay: Payer: Medicare Other | Attending: Oncology

## 2021-10-18 ENCOUNTER — Other Ambulatory Visit (HOSPITAL_COMMUNITY): Payer: Self-pay

## 2021-10-18 ENCOUNTER — Telehealth: Payer: Self-pay

## 2021-10-18 ENCOUNTER — Other Ambulatory Visit: Payer: Self-pay

## 2021-10-18 ENCOUNTER — Inpatient Hospital Stay: Payer: Medicare Other

## 2021-10-18 DIAGNOSIS — C787 Secondary malignant neoplasm of liver and intrahepatic bile duct: Secondary | ICD-10-CM

## 2021-10-18 DIAGNOSIS — Z79899 Other long term (current) drug therapy: Secondary | ICD-10-CM | POA: Diagnosis not present

## 2021-10-18 DIAGNOSIS — C50411 Malignant neoplasm of upper-outer quadrant of right female breast: Secondary | ICD-10-CM | POA: Insufficient documentation

## 2021-10-18 DIAGNOSIS — C7951 Secondary malignant neoplasm of bone: Secondary | ICD-10-CM

## 2021-10-18 DIAGNOSIS — C50911 Malignant neoplasm of unspecified site of right female breast: Secondary | ICD-10-CM

## 2021-10-18 DIAGNOSIS — Z170 Estrogen receptor positive status [ER+]: Secondary | ICD-10-CM

## 2021-10-18 DIAGNOSIS — Z5111 Encounter for antineoplastic chemotherapy: Secondary | ICD-10-CM | POA: Diagnosis not present

## 2021-10-18 DIAGNOSIS — M8580 Other specified disorders of bone density and structure, unspecified site: Secondary | ICD-10-CM

## 2021-10-18 LAB — CBC WITH DIFFERENTIAL (CANCER CENTER ONLY)
Abs Immature Granulocytes: 0.01 K/uL (ref 0.00–0.07)
Basophils Absolute: 0.1 K/uL (ref 0.0–0.1)
Basophils Relative: 2 %
Eosinophils Absolute: 0 K/uL (ref 0.0–0.5)
Eosinophils Relative: 0 %
HCT: 40.3 % (ref 36.0–46.0)
Hemoglobin: 13.6 g/dL (ref 12.0–15.0)
Immature Granulocytes: 0 %
Lymphocytes Relative: 11 %
Lymphs Abs: 0.5 K/uL — ABNORMAL LOW (ref 0.7–4.0)
MCH: 35.1 pg — ABNORMAL HIGH (ref 26.0–34.0)
MCHC: 33.7 g/dL (ref 30.0–36.0)
MCV: 103.9 fL — ABNORMAL HIGH (ref 80.0–100.0)
Monocytes Absolute: 0.4 K/uL (ref 0.1–1.0)
Monocytes Relative: 9 %
Neutro Abs: 3.5 K/uL (ref 1.7–7.7)
Neutrophils Relative %: 78 %
Platelet Count: 199 K/uL (ref 150–400)
RBC: 3.88 MIL/uL (ref 3.87–5.11)
RDW: 14.5 % (ref 11.5–15.5)
WBC Count: 4.6 K/uL (ref 4.0–10.5)
nRBC: 0 % (ref 0.0–0.2)

## 2021-10-18 LAB — CMP (CANCER CENTER ONLY)
ALT: 9 U/L (ref 0–44)
AST: 14 U/L — ABNORMAL LOW (ref 15–41)
Albumin: 4.5 g/dL (ref 3.5–5.0)
Alkaline Phosphatase: 88 U/L (ref 38–126)
Anion gap: 8 (ref 5–15)
BUN: 30 mg/dL — ABNORMAL HIGH (ref 8–23)
CO2: 22 mmol/L (ref 22–32)
Calcium: 10 mg/dL (ref 8.9–10.3)
Chloride: 106 mmol/L (ref 98–111)
Creatinine: 1.3 mg/dL — ABNORMAL HIGH (ref 0.44–1.00)
GFR, Estimated: 44 mL/min — ABNORMAL LOW (ref >60)
Glucose, Bld: 93 mg/dL (ref 70–99)
Potassium: 4.8 mmol/L (ref 3.5–5.1)
Sodium: 136 mmol/L (ref 135–145)
Total Bilirubin: 0.3 mg/dL (ref 0.3–1.2)
Total Protein: 7.7 g/dL (ref 6.5–8.1)

## 2021-10-18 IMAGING — DX DG CHEST 2V
2 series · 2 of 2 positions shown · non-contrast
Comparison: 12/12/2018

CLINICAL DATA: Wheezing for several days. Metastatic breast
carcinoma.

EXAM:
CHEST - 2 VIEW

[chest pa]
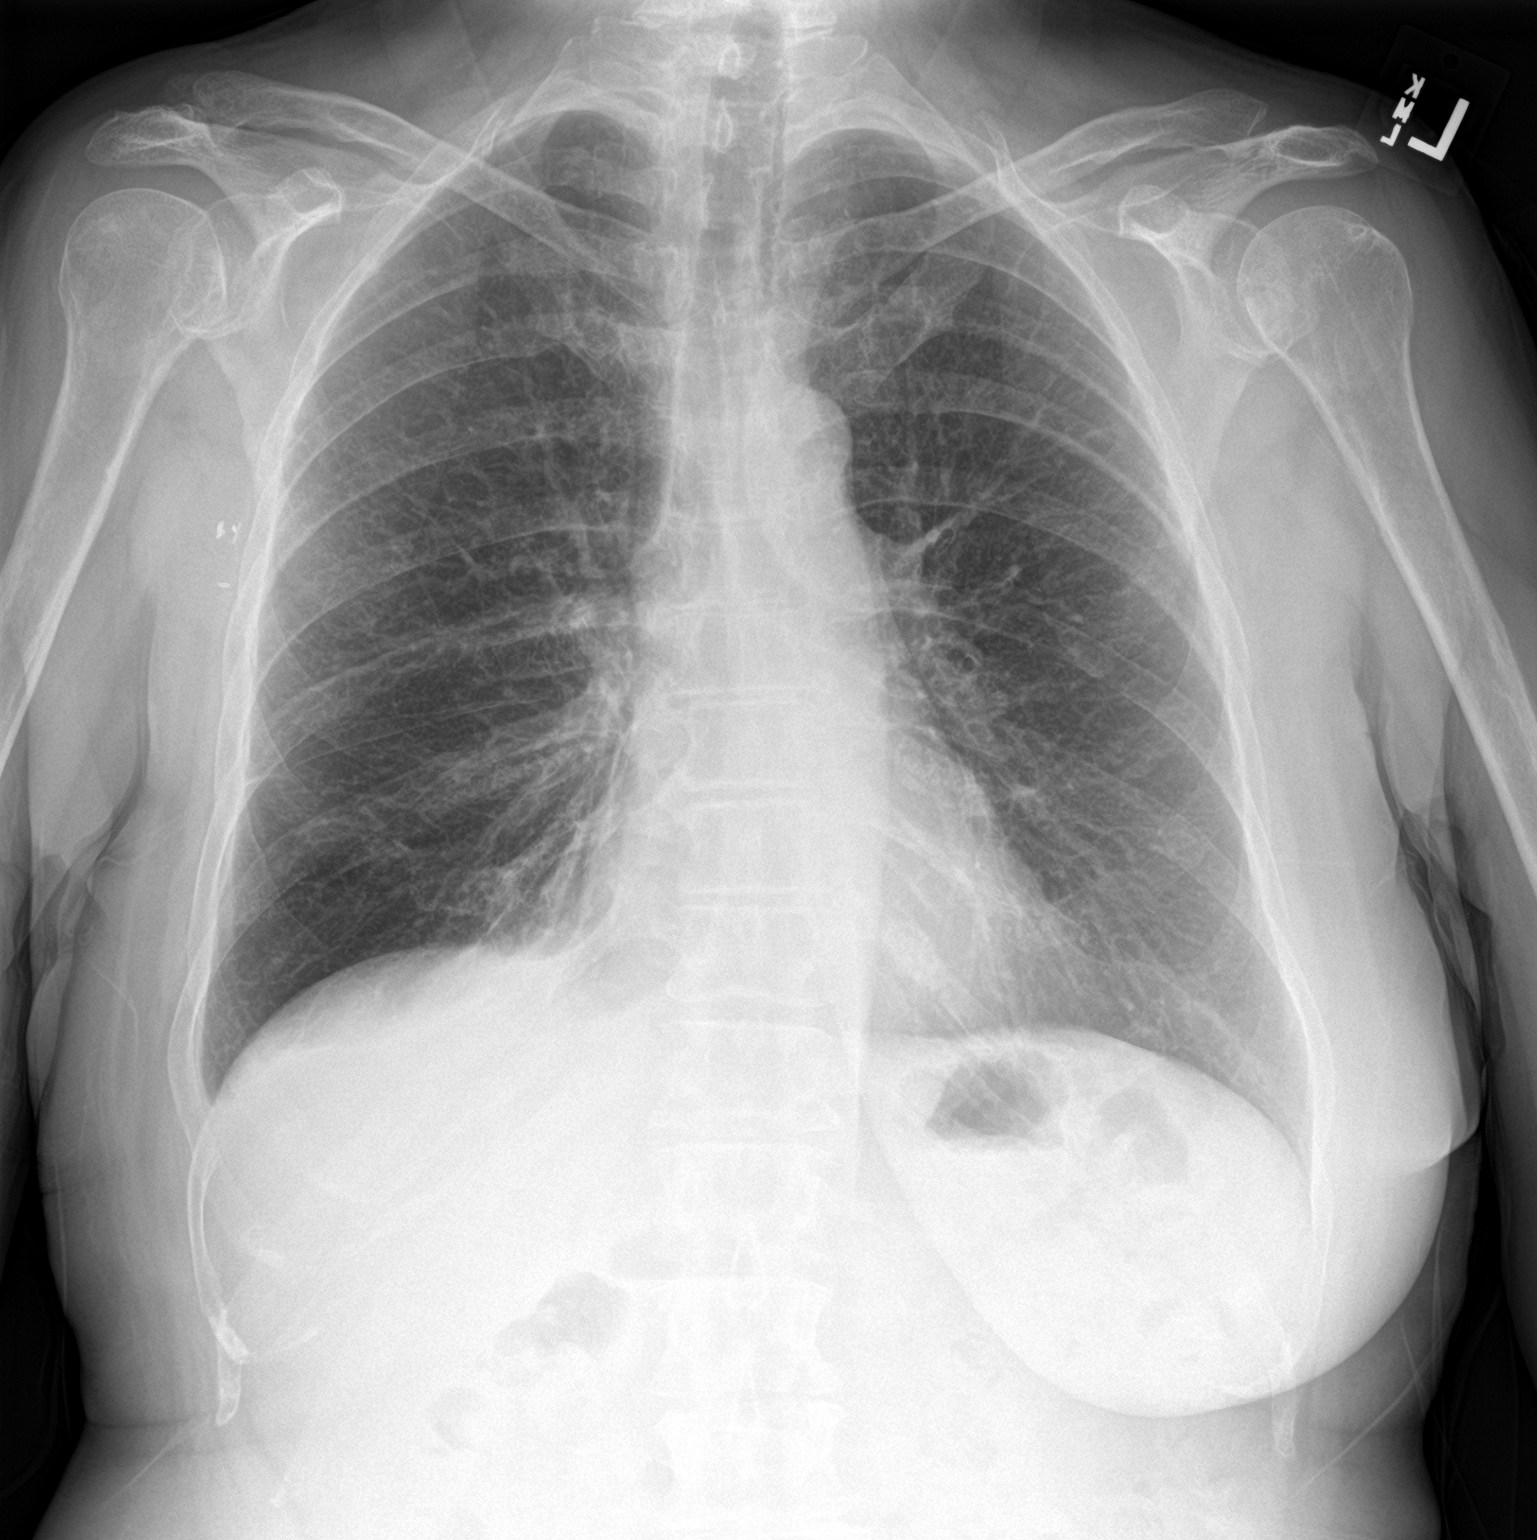

[chest lat]
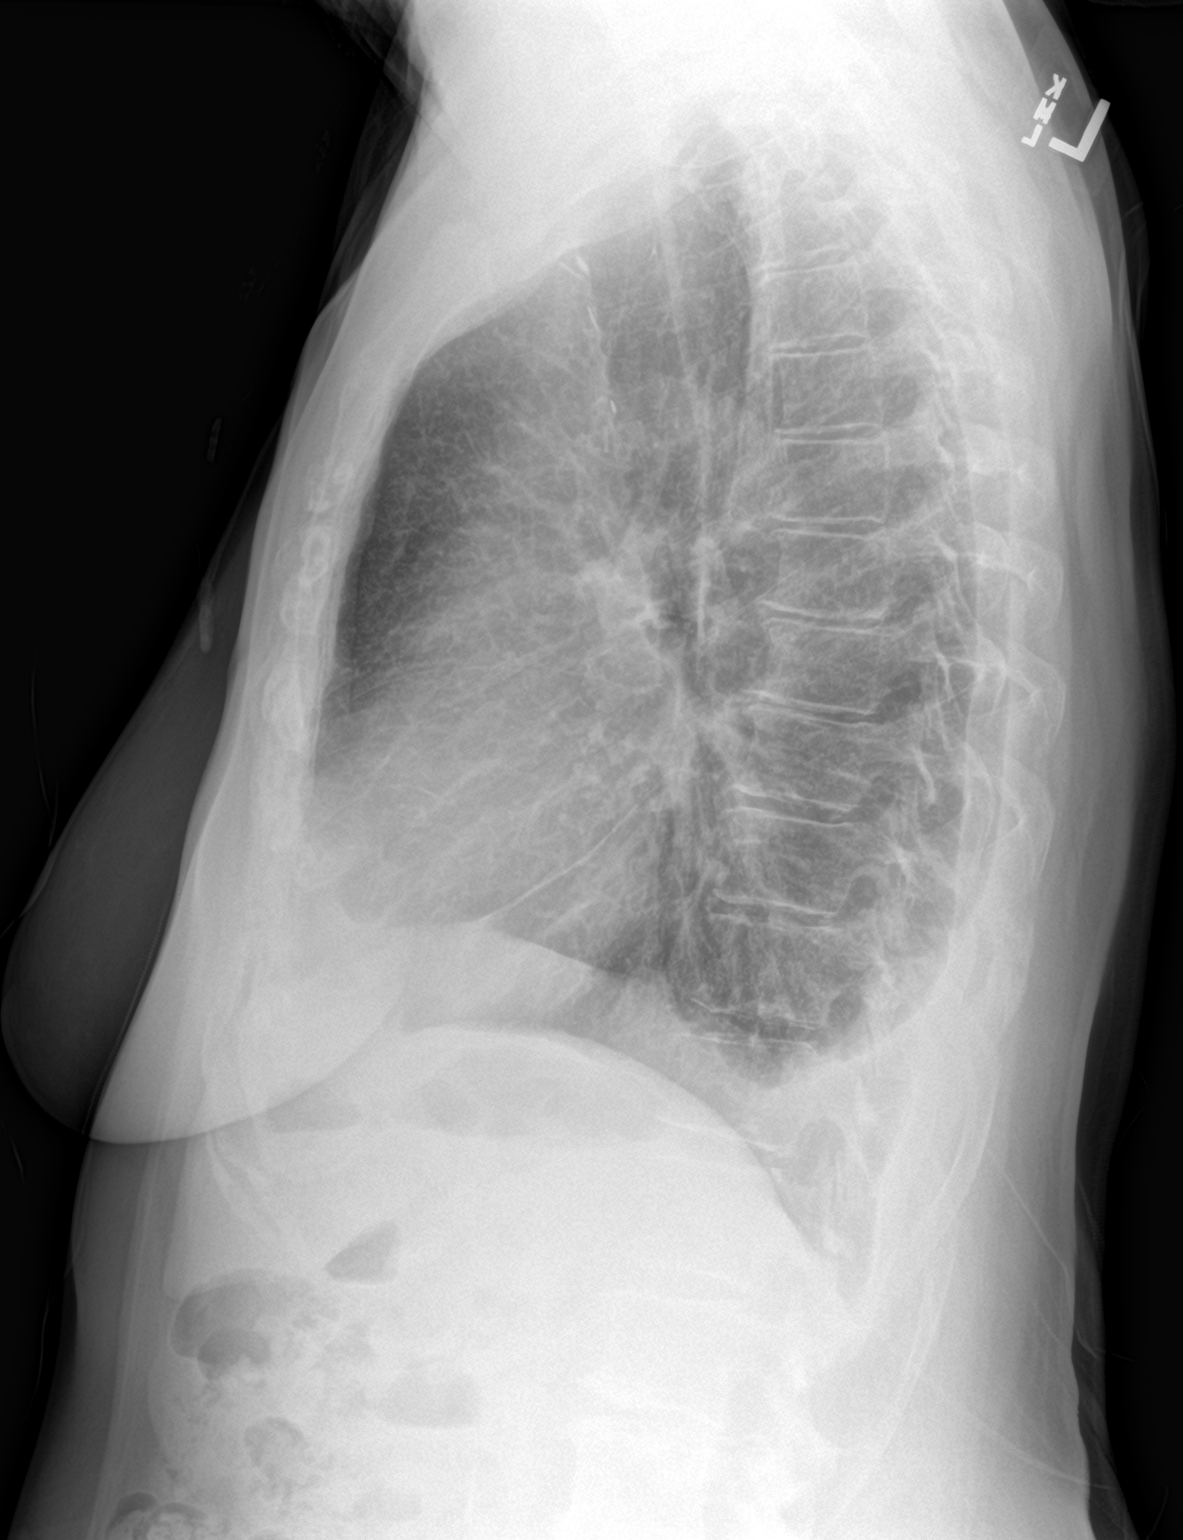

[2 of 2 positions shown; findings below may reference images not displayed]

FINDINGS: The heart size and mediastinal contours are within normal limits.
Small right posterior pleural effusion versus pleural thickening is
noted, which is best seen on lateral projection. No evidence of
pulmonary infiltrate or edema. Postop changes again noted from right
mastectomy and axillary lymph node dissection. The visualized
skeletal structures are unremarkable.
IMPRESSION: Small right posterior pleural effusion versus pleural thickening. No
evidence of pulmonary infiltrate or edema.

## 2021-10-18 MED ORDER — FULVESTRANT 250 MG/5ML IM SOSY
500.0000 mg | PREFILLED_SYRINGE | Freq: Once | INTRAMUSCULAR | Status: AC
  Administered 2021-10-18: 500 mg via INTRAMUSCULAR
  Filled 2021-10-18: qty 10

## 2021-10-18 MED ORDER — DENOSUMAB 120 MG/1.7ML ~~LOC~~ SOLN
120.0000 mg | Freq: Once | SUBCUTANEOUS | Status: AC
  Administered 2021-10-18: 120 mg via SUBCUTANEOUS
  Filled 2021-10-18: qty 1.7

## 2021-10-18 MED ORDER — ABEMACICLIB 100 MG PO TABS
100.0000 mg | ORAL_TABLET | Freq: Two times a day (BID) | ORAL | 3 refills | Status: DC
  Filled 2021-10-18: qty 70, 35d supply, fill #0

## 2021-10-18 NOTE — Telephone Encounter (Signed)
Oral Oncology Pharmacist Encounter ? ?Received new prescription for abemaciclib (Verzenio) for the treatment of metastatic breast cancer in conjunction with faslodex, planned duration until disease progression and unacceptable toxicity. Prescription dose and frequency assessed. ? ?Labs from 10/18/21 assessed, no interventions needed. . ? ?Current medication list in Epic reviewed, DDIs with verzenio identified: ?- metformin- monitor for increased metformin toxicities.  ? ?Evaluated chart and no patient barriers to medication adherence noted.  ? ?Patient agreement for treatment documented in MD note on 10/18/2021. ? ?Prescription has been e-scribed to the [facility] for benefits analysis and approval. ? ?Oral Oncology Clinic will continue to follow for insurance authorization, copayment issues, initial counseling and start date. ? ?Drema Halon, PharmD ?Hematology/Oncology Clinical Pharmacist ?[facility] ?[phone] ?10/18/2021 12:37 PM ? ? ?

## 2021-10-18 NOTE — Assessment & Plan Note (Signed)
Metastatic breast cancer (pericardial effusion, pleural effusion, right middle lobe lung lesion, ER 91%, PR 90%, Ki-67 35%, HER2 negative diagnosed February 2013) ?? ?(Prior history of right mastectomy 2005, adjuvant chemo with Adriamycin Cytoxan and Taxol followed by tamoxifen) ?? ?Current treatment: Ribociclib, fulvestrant, Xgeva ?Toxicities: Tolerating treatment extremely well. ?? ?Scans: ?liver MRI 10/14/2021: Multiple stable to slightly enlarged hepatic metastatic lesions suggesting minimal progression, unchanged thoracic and lumbar vertebral metastases ? ?? ?Recommendation: ?1. We discussed about switching her from ribociclib to abemaciclib ?2. monthly for Faslodex injections ?Bone metastasis: On Xgeva: I recommended moving Xgeva to every 3 months. ? ?Return to clinic in 2 weeks to see Jenny Reichmann ?

## 2021-10-19 ENCOUNTER — Telehealth: Payer: Self-pay | Admitting: Hematology and Oncology

## 2021-10-19 NOTE — Telephone Encounter (Signed)
Scheduled appointment per 3/23 los. Left message.Left message. Patient will be mailed an updated calendar. ?

## 2021-10-25 ENCOUNTER — Telehealth: Payer: Self-pay

## 2021-10-25 NOTE — Telephone Encounter (Signed)
Oral Oncology Patient Advocate Encounter ? ?Met patient in [facility] to complete an application for [employer] Patient Assistance Program in an effort to reduce the patient's out of pocket expense for Verzenio to $0.   ? ?Application completed and faxed to [phone].  ? ?[employer] patient assistance phone number for follow up is [phone].  ? ?This encounter will be updated until final determination.  ? ? ?Elizabeth Tilley CPHT ?Specialty Pharmacy Patient Advocate ?[facility] ?Phone [phone] ?Fax [phone] ?10/25/2021 9:37 AM ? ?

## 2021-10-26 ENCOUNTER — Other Ambulatory Visit: Payer: Self-pay

## 2021-10-26 IMAGING — MG DIGITAL SCREENING UNILAT LEFT W/ TOMO W/ CAD
6 series · 6 of 18 positions shown · non-contrast
Comparison: Previous exam(s).

CLINICAL DATA: Screening.

EXAM:
DIGITAL SCREENING UNILATERAL LEFT MAMMOGRAM WITH CAD AND TOMO

[L MLO synth-2D]
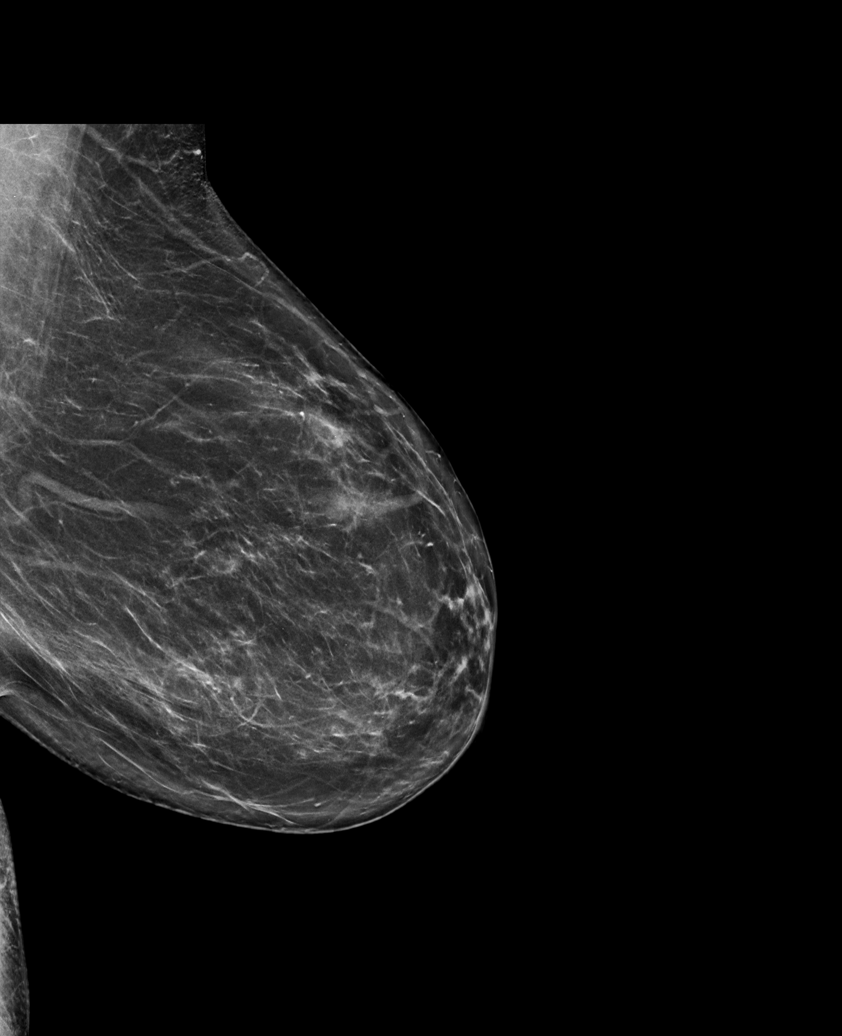

[L CC synth-2D (1 of 2)]
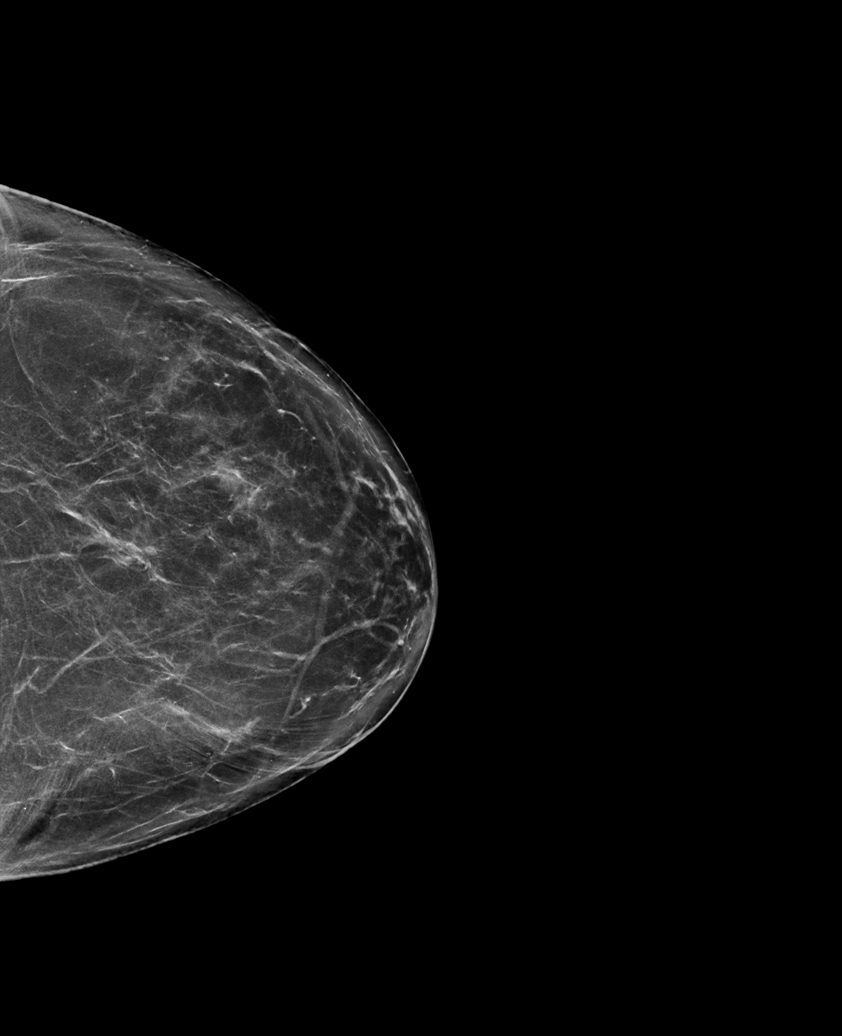

[L CC synth-2D (2 of 2)]
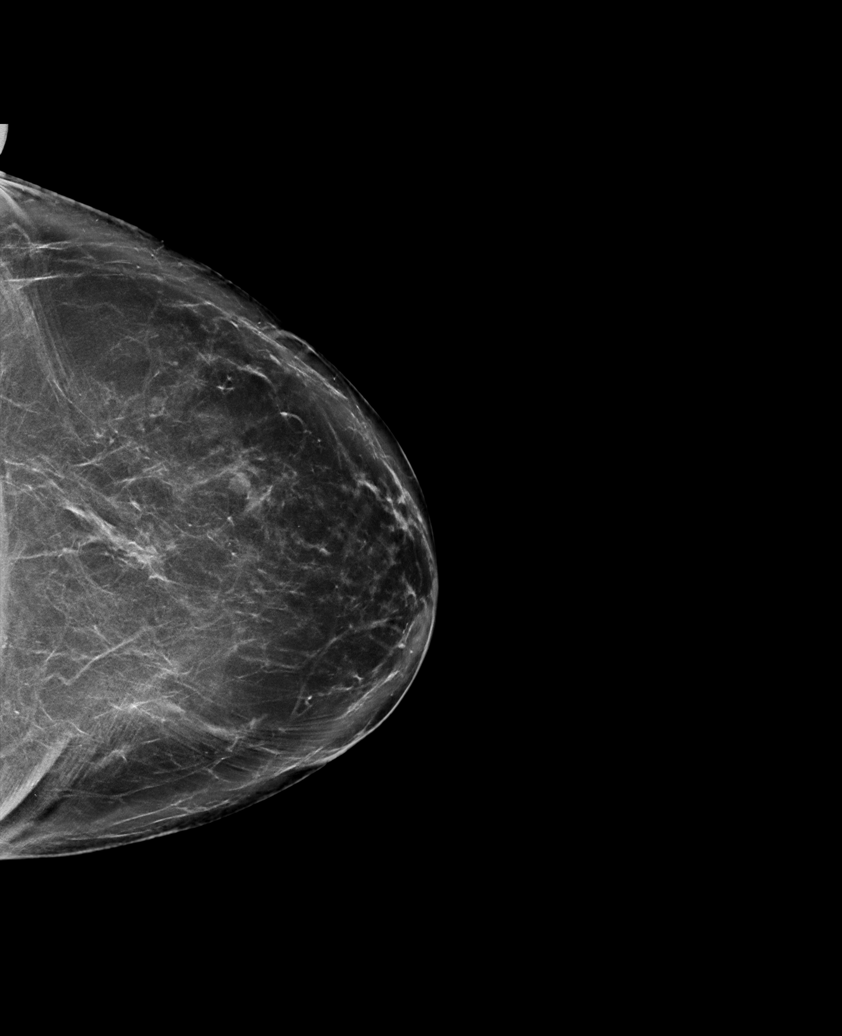

[L CC tomo (1 of 2) · tomo slice 39/76.0]
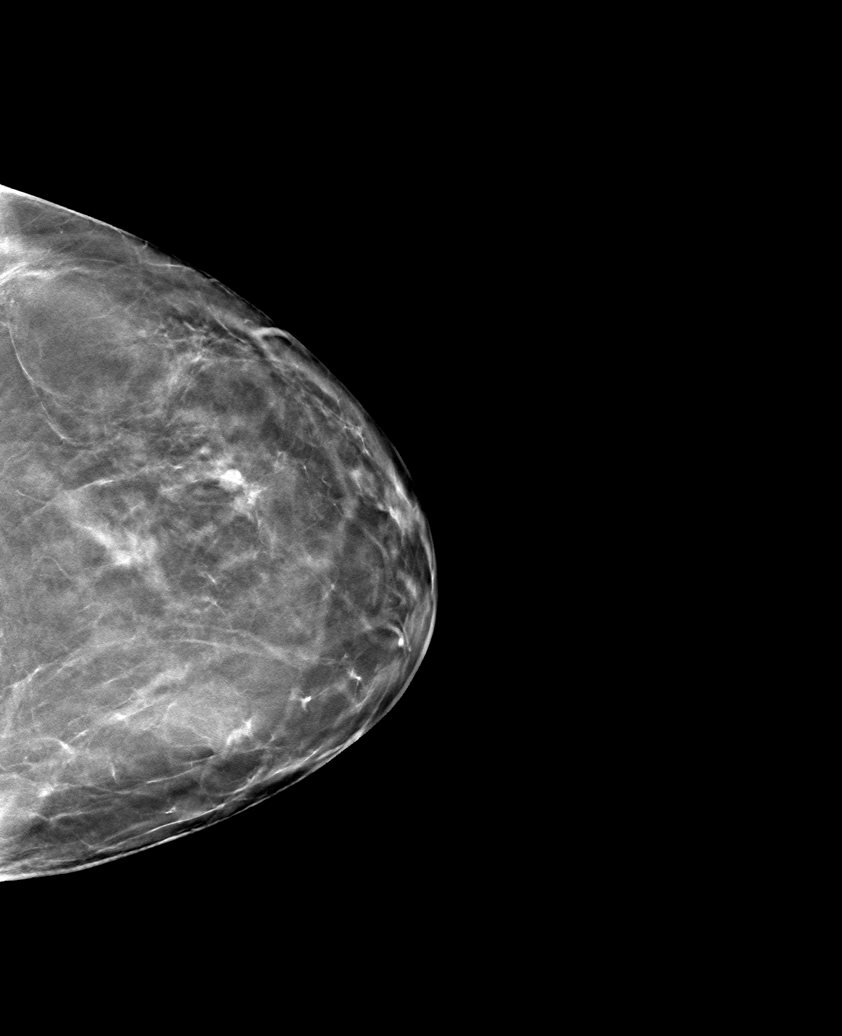

[L CC tomo (2 of 2) · tomo slice 43/84.0]
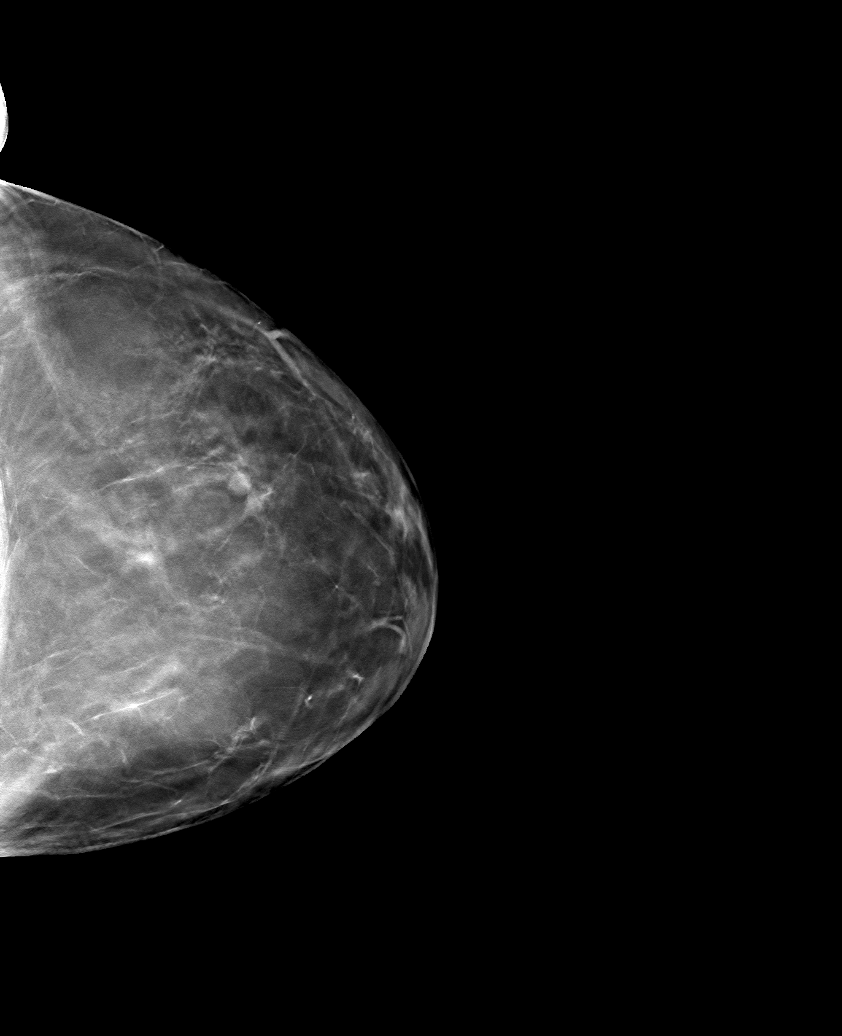

[L MLO tomo · tomo slice 38/75.0]
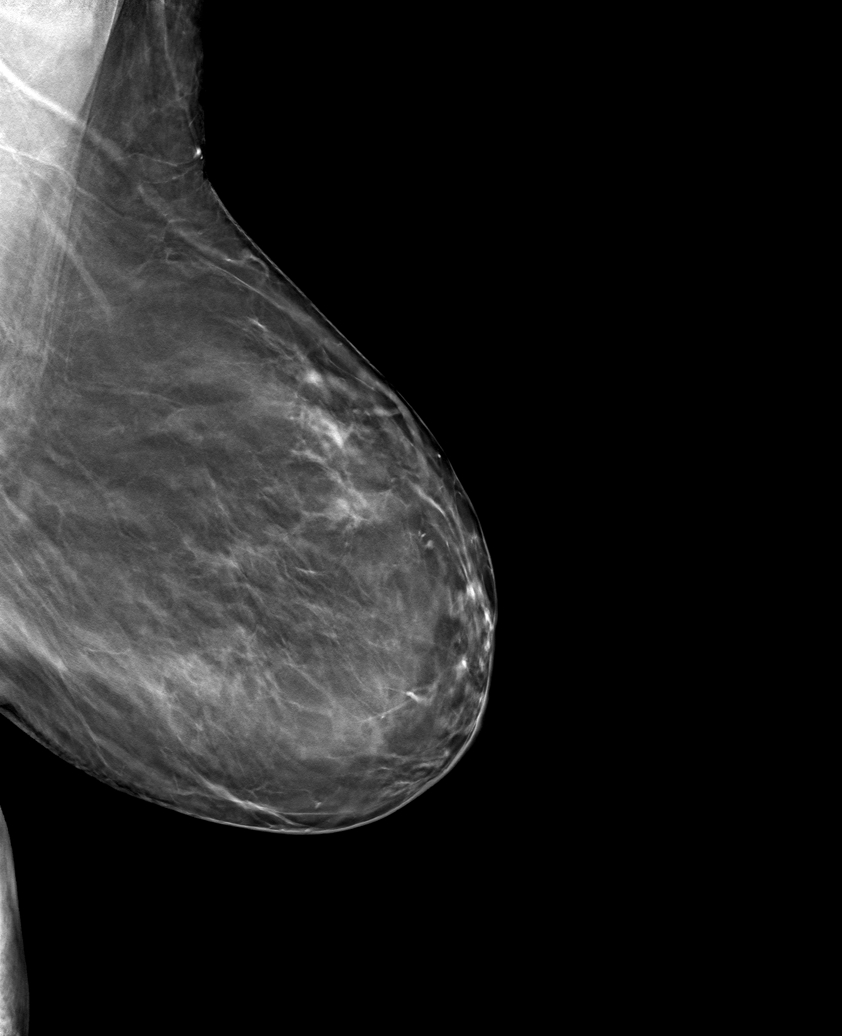

[6 of 18 positions shown; findings below may reference images not displayed]

ACR Breast Density Category b: There are scattered areas of
fibroglandular density.
FINDINGS: The patient has had a right mastectomy. There are no findings
suspicious for malignancy.

Images were processed with CAD.
IMPRESSION: No mammographic evidence of malignancy. A result letter of this
screening mammogram will be mailed directly to the patient.

RECOMMENDATION:
Screening mammogram in one year.  (Code:GY-Q-70Y)

BI-RADS CATEGORY  1: Negative.

## 2021-10-26 MED ORDER — AMOXICILLIN 500 MG PO TABS: 500.0000 mg | ORAL_TABLET | Freq: Two times a day (BID) | ORAL | 0 refills | Status: AC

## 2021-10-26 NOTE — Telephone Encounter (Signed)
Patient is approved for Verzenio at no cost from [employer] 10/26/21-10/27/22 ? ?[facility] [employer] ? ?W[facility] CPHT ?[facility] ?[place] ?Phone [phone] ?Fax [phone] ?10/26/2021 9:17 AM ? ?

## 2021-10-26 NOTE — Progress Notes (Signed)
Returned call to pt, pt complains of tooth pain, dentist seeing pt in 2 weeks but pt is calling to get in sooner if there has been a cancellation.  Per MD pt to start on amoxicillin 500 BID.  Per MD pt to hold verzenio 1 week before any procedure to address her tooth infection.  Pt verbalized understanding and thanks.   ? ?Rx sent to pt preferred pharmacy.  ?

## 2021-10-28 ENCOUNTER — Encounter: Payer: Self-pay | Admitting: Hematology and Oncology

## 2021-11-01 DIAGNOSIS — Z20822 Contact with and (suspected) exposure to covid-19: Secondary | ICD-10-CM | POA: Diagnosis not present

## 2021-11-07 IMAGING — CT CT CHEST W/ CM
2 of 4 series · 15 of 36 positions shown, 18 images · IV contrast (OMNIPAQUE)
Comparison: None.

CLINICAL DATA: Breast cancer staging, follow-up of measurable
disease, currently on systemic oral therapy. Post RIGHT mastectomy

EXAM:
CT CHEST WITH CONTRAST
TECHNIQUE: Multidetector CT imaging of the chest was performed during
intravenous contrast administration.
CONTRAST:  60mL OMNIPAQUE IOHEXOL 300 MG/ML  SOLN

[Series 2: axial st · axial · 0.73mm/px · z∈[-328,-22]mm · 12 of 179 slices shown, 15 images]
[im 13/179  mediastinal]
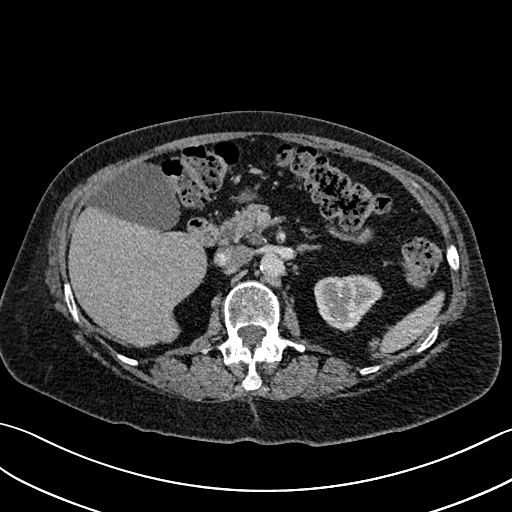
[im 13/179  lung]
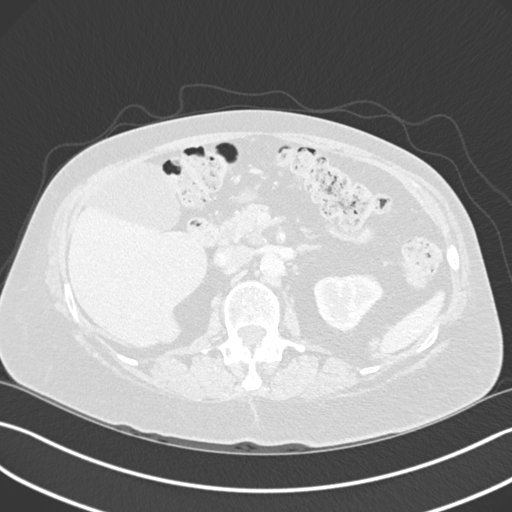
[im 26/179  lung]
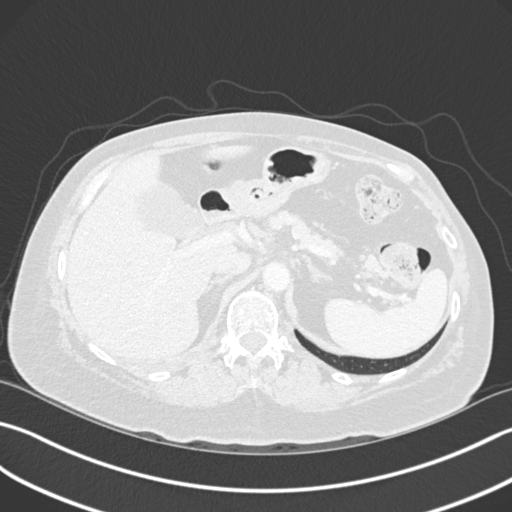
[im 39/179  lung]
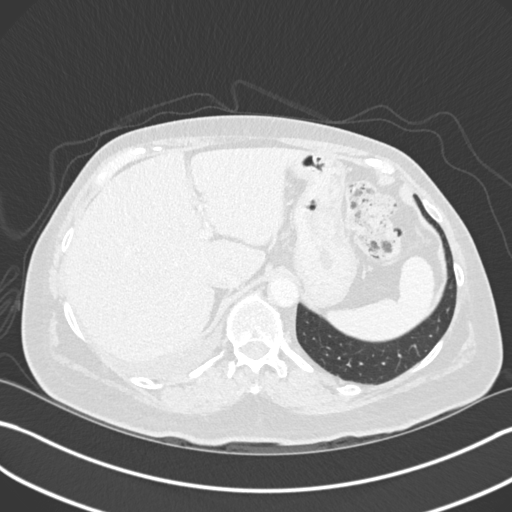
[im 51/179  lung]
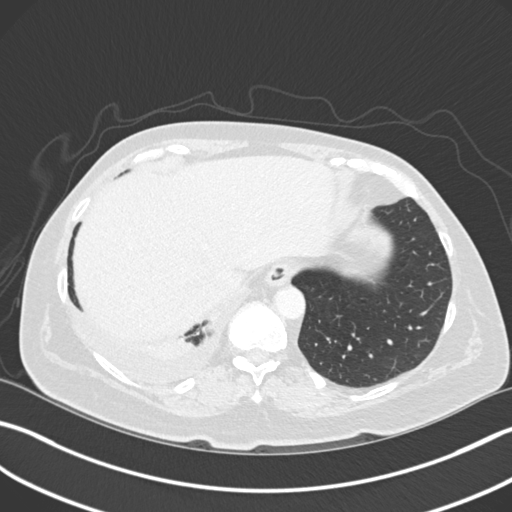
[im 64/179  mediastinal]
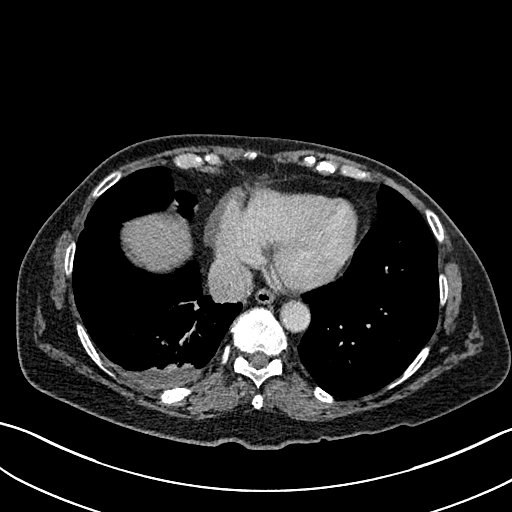
[im 64/179  lung]
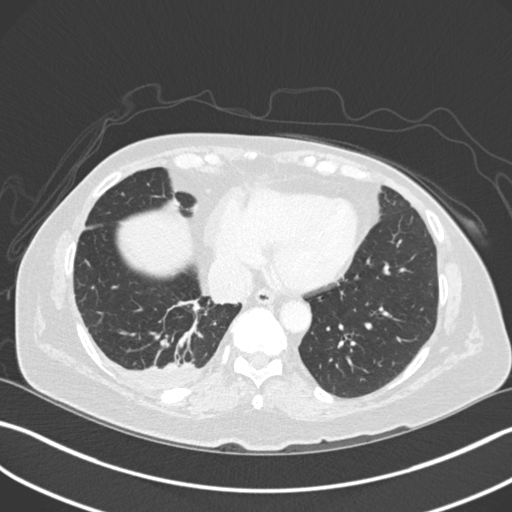
[im 77/179  lung]
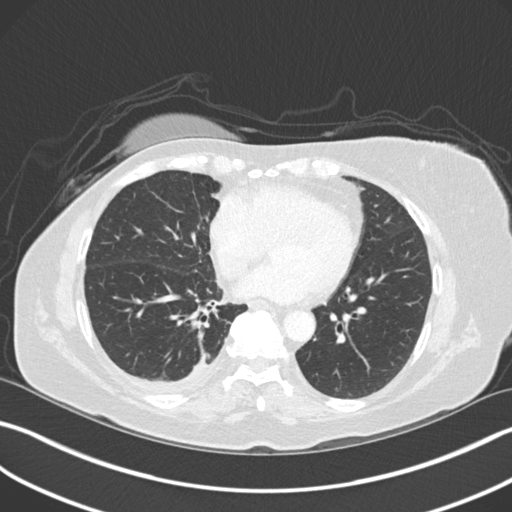
[im 102/179  lung]
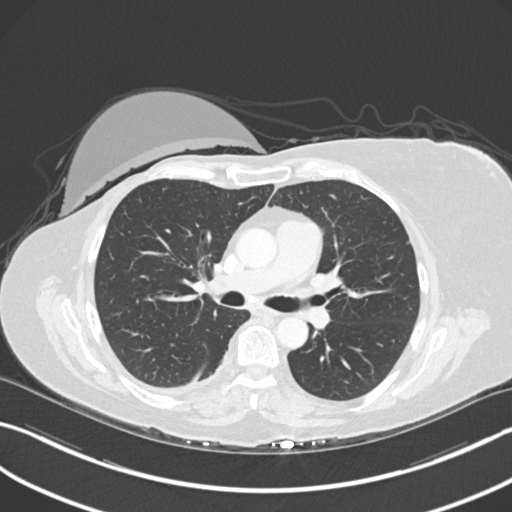
[im 115/179  lung]
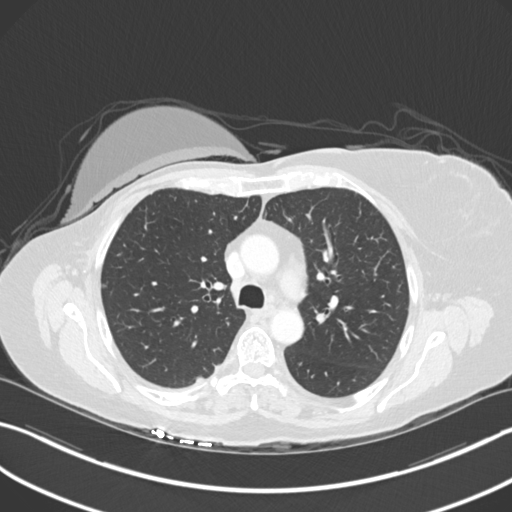
[im 128/179  mediastinal]
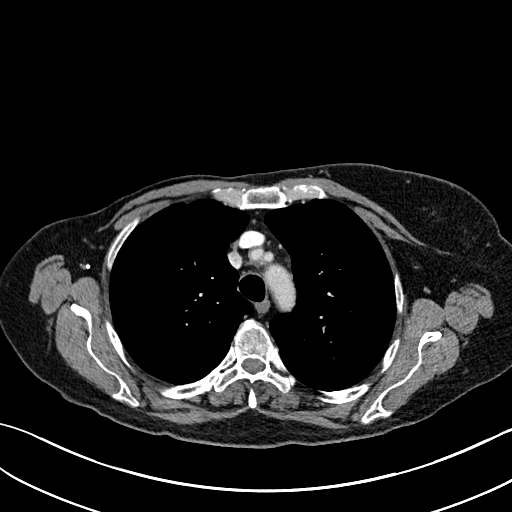
[im 128/179  lung]
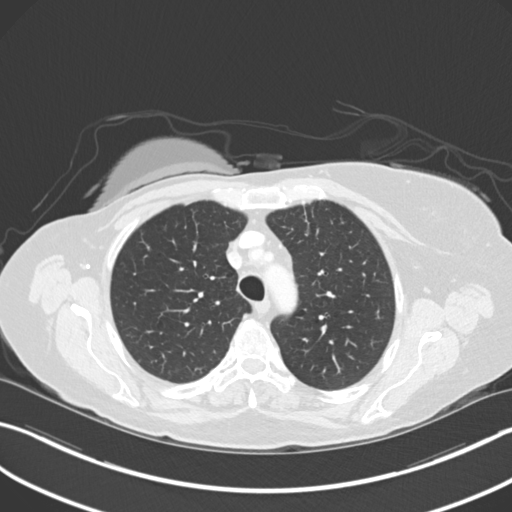
[im 140/179  lung]
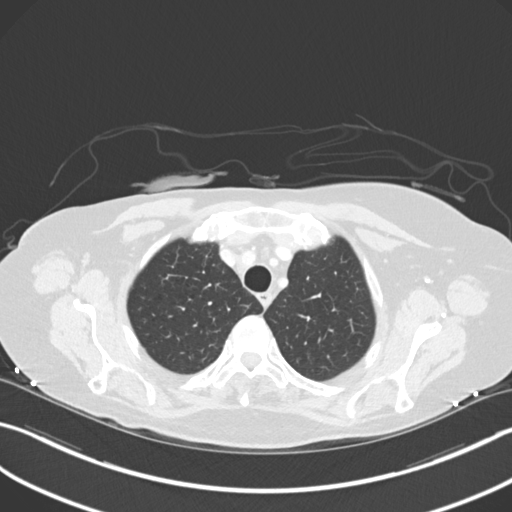
[im 153/179  lung]
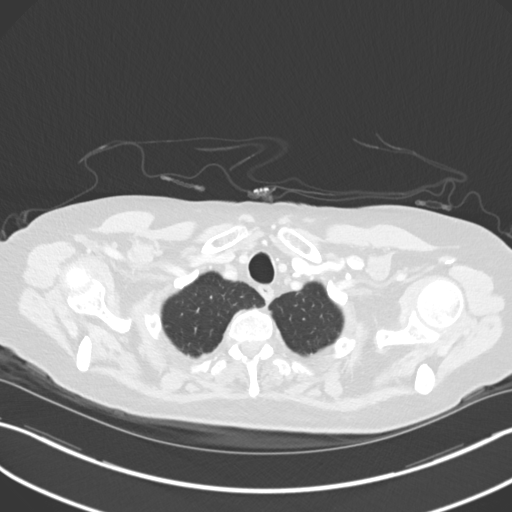
[im 166/179  lung]
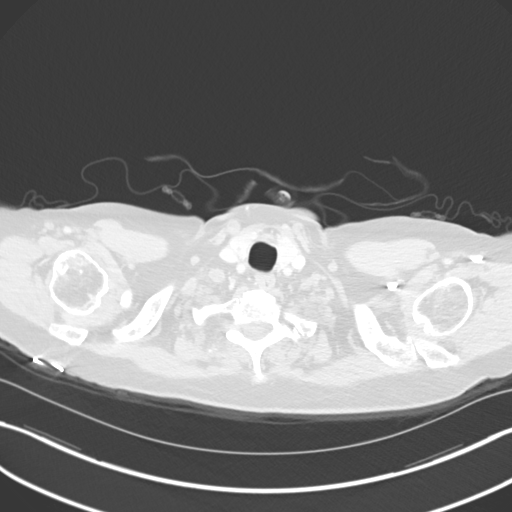

[Series 5: coronal · coronal · 0.70mm/px · 3 of 123 slices shown]
[im 25/123  lung]
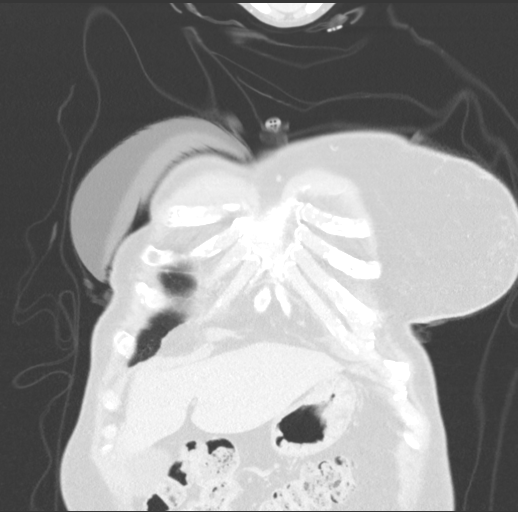
[im 49/123  lung]
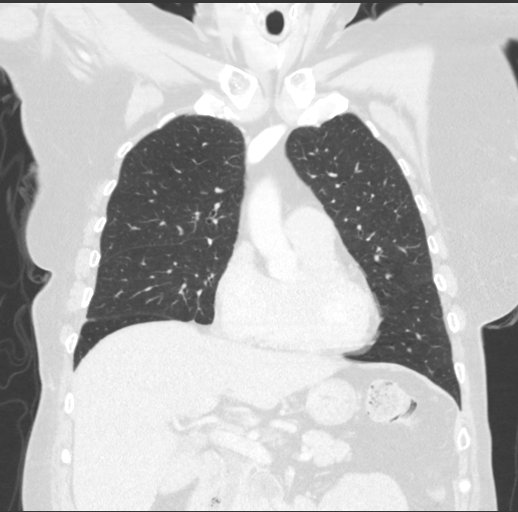
[im 74/123  lung]
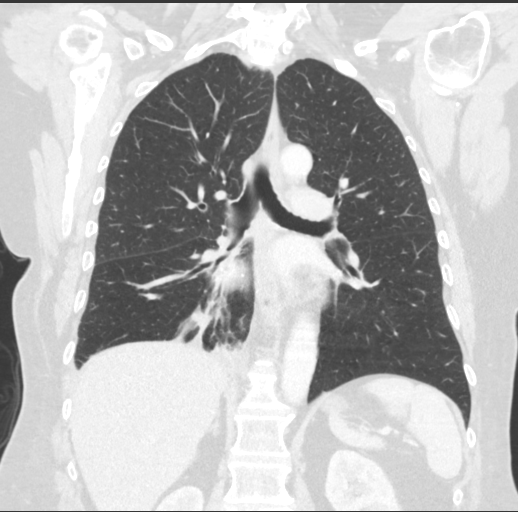

[15 of 36 positions shown; findings below may reference images not displayed]

FINDINGS: Cardiovascular: Calcified and noncalcified atheromatous plaque of
the thoracic aorta. Heart size is normal without pericardial
effusion. No aneurysmal dilation of the thoracic aorta. Central
pulmonary vasculature unremarkable on venous phase assessment.

Mediastinum/Nodes: Thoracic inlet structures are unremarkable. No
axillary lymphadenopathy. Post RIGHT axillary dissection and RIGHT
mastectomy.

Lungs/Pleura: Small RIGHT-sided pleural effusion is unchanged from
November 01, 2019. Areas of associated airspace disease are also stable.
No new consolidation or sign of pleural effusion. Similar appearance
of soft tissue adjacent to lower lobe bronchi on image 89 of series
7 at approximately 9 mm however on today's study as compared to 11
mm previously.

Upper Abdomen: Incidental imaging of upper abdominal contents
without acute process. Imaged portions of liver, gallbladder,
biliary tree, pancreas, spleen and adrenal glands is unchanged.
Well-circumscribed low-density LEFT adrenal lesion is stable,
compatible with LEFT adrenal adenoma.

Focal hepatic lesions not well seen on the current exam with grossly
similar appearance of the lateral segment LEFT hepatic lobe lesion
along the fissure for false form ligament on image 144 of series 2.
Lesion in the inferior LEFT hepatic lobe in hepatic subsegment III
is not as well seen as on the prior CT nor the prior MRI.

Musculoskeletal: Multifocal areas of sclerosis more sclerotic than
on prior imaging studies. Most notably in the bilateral scapula and
T4. There is increasing sclerosis and a small lesion in T5 which
appears of similar size. Less lytic change in T4 than on previous
imaging with more sclerotic appearance. Subtle area in T11 on the
prior study is now densely sclerotic as well, seen only in
hindsight.
IMPRESSION: 1. Multifocal areas of sclerosis with increased sclerosis since
previous imaging but with similar size accounting for areas not as
well seen on the prior study. The increase in sclerotic change may
reflect treatment related effect but remains of uncertain
significance. Attention on follow-up.
2. Very limited assessment of focal hepatic lesions which are better
seen on previous studies. The only lesion that is seen clearly on
today's exam appears grossly similar to prior imaging.
3. Central lymph node or small central nodule adjacent to lower lobe
bronchi in the superior segment of the RIGHT lower lobe with slight
decreased size compared to previous imaging.
4. Stable small RIGHT effusion
5. LEFT adrenal adenoma.
6. Aortic atherosclerosis.

Aortic Atherosclerosis ([id number]).

## 2021-11-08 ENCOUNTER — Encounter: Payer: Self-pay | Admitting: Hematology and Oncology

## 2021-11-08 NOTE — Addendum Note (Signed)
Addended by: Neysa Hotter on: 11/08/2021 03:47 PM ? ? Modules accepted: Orders ? ?

## 2021-11-13 NOTE — Telephone Encounter (Signed)
Oral Chemotherapy Pharmacist Encounter ? ?I spoke with patient for overview of: Verzenio for the treatment of metastatic, hormone-receptor positive breast cancer, in combination with faslodex, planned duration until disease progression or unacceptable toxicity.  ? ?Counseled patient on administration, dosing, side effects, monitoring, drug-food interactions, safe handling, storage, and disposal. ? ?Patient will take Verzenio '100mg'$  tablets, 1 tablet by mouth twice daily without regard to food. ? ?Patient knows to avoid grapefruit and grapefruit juice. ? ?Verzenio start date: 11/14/2021 ? ?Patient had dentist appt on Friday 11/09/21 and the root canal will not be until June so patient is starting medication. ? ?Adverse effects include but are not limited to: diarrhea, fatigue, nausea, abdominal pain, decreased blood counts, and increased liver function tests, and joint pains. ?Severe, life-threatening, and/or fatal interstitial lung disease (ILD) and/or pneumonitis may occur with CDK 4/6 inhibitors. ? ?Patient has anti-emetic on hand and knows to take it if nausea develops.   ?Patient will obtain anti diarrheal and alert the office of 4 or more loose stools above baseline. ? ?Reviewed with patient importance of keeping a medication schedule and plan for any missed doses. No barriers to medication adherence identified. ? ?Medication reconciliation performed and medication/allergy list updated. ? ?Patient informed the pharmacy will reach out 5-7 days prior to needing next fill of Verzenio to coordinate continued medication acquisition to prevent break in therapy. ? ?All questions answered. ? ?Mrs. Casseus voiced understanding and appreciation.  ? ?Medication education handout placed in mail for patient. Patient knows to call the office with questions or concerns. Oral Chemotherapy Clinic phone number provided to patient.  ? ?Drema Halon, PharmD ?Hematology/Oncology Clinical Pharmacist ?[facility] ?[phone] ?11/13/2021   2:08 PM ? ?

## 2021-11-15 ENCOUNTER — Other Ambulatory Visit: Payer: Self-pay

## 2021-11-15 ENCOUNTER — Ambulatory Visit: Payer: Medicare Other

## 2021-11-15 ENCOUNTER — Inpatient Hospital Stay: Payer: Medicare Other | Admitting: Pharmacist

## 2021-11-15 ENCOUNTER — Inpatient Hospital Stay: Payer: Medicare Other | Attending: Oncology

## 2021-11-15 ENCOUNTER — Inpatient Hospital Stay: Payer: Medicare Other

## 2021-11-15 VITALS — BP 114/64 | HR 110 | Temp 98.0°F | Wt 155.2 lb

## 2021-11-15 DIAGNOSIS — Z79899 Other long term (current) drug therapy: Secondary | ICD-10-CM | POA: Diagnosis not present

## 2021-11-15 DIAGNOSIS — C50911 Malignant neoplasm of unspecified site of right female breast: Secondary | ICD-10-CM

## 2021-11-15 DIAGNOSIS — Z170 Estrogen receptor positive status [ER+]: Secondary | ICD-10-CM

## 2021-11-15 DIAGNOSIS — Z5111 Encounter for antineoplastic chemotherapy: Secondary | ICD-10-CM | POA: Insufficient documentation

## 2021-11-15 DIAGNOSIS — C50411 Malignant neoplasm of upper-outer quadrant of right female breast: Secondary | ICD-10-CM | POA: Insufficient documentation

## 2021-11-15 DIAGNOSIS — C7951 Secondary malignant neoplasm of bone: Secondary | ICD-10-CM | POA: Diagnosis not present

## 2021-11-15 LAB — CBC WITH DIFFERENTIAL (CANCER CENTER ONLY)
Abs Immature Granulocytes: 0.02 10*3/uL (ref 0.00–0.07)
Basophils Absolute: 0.1 10*3/uL (ref 0.0–0.1)
Basophils Relative: 2 %
Eosinophils Absolute: 0 10*3/uL (ref 0.0–0.5)
Eosinophils Relative: 1 %
HCT: 41.4 % (ref 36.0–46.0)
Hemoglobin: 13.9 g/dL (ref 12.0–15.0)
Immature Granulocytes: 0 %
Lymphocytes Relative: 14 %
Lymphs Abs: 0.6 10*3/uL — ABNORMAL LOW (ref 0.7–4.0)
MCH: 34.7 pg — ABNORMAL HIGH (ref 26.0–34.0)
MCHC: 33.6 g/dL (ref 30.0–36.0)
MCV: 103.2 fL — ABNORMAL HIGH (ref 80.0–100.0)
Monocytes Absolute: 0.5 10*3/uL (ref 0.1–1.0)
Monocytes Relative: 11 %
Neutro Abs: 3.3 10*3/uL (ref 1.7–7.7)
Neutrophils Relative %: 72 %
Platelet Count: 218 10*3/uL (ref 150–400)
RBC: 4.01 MIL/uL (ref 3.87–5.11)
RDW: 14.6 % (ref 11.5–15.5)
WBC Count: 4.6 10*3/uL (ref 4.0–10.5)
nRBC: 0 % (ref 0.0–0.2)

## 2021-11-15 LAB — CMP (CANCER CENTER ONLY)
ALT: 9 U/L (ref 0–44)
AST: 15 U/L (ref 15–41)
Albumin: 4.3 g/dL (ref 3.5–5.0)
Alkaline Phosphatase: 94 U/L (ref 38–126)
Anion gap: 7 (ref 5–15)
BUN: 19 mg/dL (ref 8–23)
CO2: 24 mmol/L (ref 22–32)
Calcium: 9.5 mg/dL (ref 8.9–10.3)
Chloride: 106 mmol/L (ref 98–111)
Creatinine: 1.31 mg/dL — ABNORMAL HIGH (ref 0.44–1.00)
GFR, Estimated: 44 mL/min — ABNORMAL LOW (ref >60)
Glucose, Bld: 97 mg/dL (ref 70–99)
Potassium: 5 mmol/L (ref 3.5–5.1)
Sodium: 137 mmol/L (ref 135–145)
Total Bilirubin: 0.3 mg/dL (ref 0.3–1.2)
Total Protein: 7.9 g/dL (ref 6.5–8.1)

## 2021-11-15 MED ORDER — FULVESTRANT 250 MG/5ML IM SOSY
500.0000 mg | PREFILLED_SYRINGE | Freq: Once | INTRAMUSCULAR | Status: AC
  Administered 2021-11-15: 500 mg via INTRAMUSCULAR
  Filled 2021-11-15: qty 10

## 2021-11-15 MED ORDER — DENOSUMAB 120 MG/1.7ML ~~LOC~~ SOLN
120.0000 mg | Freq: Once | SUBCUTANEOUS | Status: AC
  Administered 2021-11-15: 120 mg via SUBCUTANEOUS
  Filled 2021-11-15: qty 1.7

## 2021-11-15 NOTE — Progress Notes (Signed)
?[place]  ?     Telephone: [phone]?Fax: [phone]  ? ?Oncology Clinical Pharmacist Practitioner Initial Assessment ? ?Barbara Parks is a 71 y.o. female with a diagnosis of breast cancer. They were contacted today via in-person visit. ? ?Indication/Regimen ?Abemaciclib (Verzenio?) is being used appropriately for treatment of metastatic breast cancer by Dr. Nicholas Lose.   ?  ?Wt Readings from Last 1 Encounters:  ?11/15/21 155 lb 3.2 oz (70.4 kg)  ? ? Estimated body surface area is 1.78 meters squared as calculated from the following: ?  Height as of 10/18/21: '5\' 4"'$  (1.626 m). ?  Weight as of this encounter: 155 lb 3.2 oz (70.4 kg). ? ?The dosing regimen is 100 mg by mouth every 12 hours on days 1 to 28 of a 28-day cycle. This is being given in combination with fulvestrant and every 4-week denosumab 120 mg. It is planned to continue until disease progression or unacceptable toxicity.  Barbara Parks was seen today by clinical pharmacy to establish care and to discuss her abemaciclib.  She was referred by Dr. Lindi Adie.  Barbara Parks states that she started the abemaciclib yesterday.  She does have loperamide at home as needed and also prochlorperazine if needed.  So far she states she is tolerating the abemaciclib well.  Today we reviewed possible side effects of abemaciclib which include but are not limited to diarrhea, neutropenia, pneumonitis, liver toxicity, blood clots, fatigue, alopecia, nausea, vomiting, and dysgeusia.  We also reviewed proper storage and handling, that the medication should be taken every 12 hours with or without food, and when to douche should she miss a dose. ? ?Barbara Parks states that she will be getting a root canal, initially thought to be in June.  Today she is due for denosumab 120 mg which she receives every 4 weeks and Dr. Lindi Adie prefers to hold denosumab 3 months before and 3 months after root canals.  After discussing this with Barbara Parks, she states that  she will make sure that her root canal is at the end of July or early August.  She knows that she will likely not receive denosumab after today until around October of this year.  This is due to an increased risk for ONJ and out of an abundance of caution.  Barbara Parks verbalized understanding of the plan. ? ?She states that she will be out of town several times during the month of May.  Her first trip is scheduled for 12/04/21 until 12/13/21.  She is then leaving again from 12/15/21 until approximately 12/20/21.  We did discuss that manufacturing guidelines recommend labs every 2 weeks for 2 months with abemaciclib followed by monthly labs for 2 months and then as clinically indicated.  We will schedule labs for 11/29/21 and then move her fulvestrant injection appointment from 12/13/21 to 12/14/21.  She will have labs and see Dr. Lindi Adie on that day as well.  Clinical pharmacy will then see her again tentatively on 12/27/21. ? ?We did confirm today that Barbara Parks is followed by [facility] physicians for her diabetes management.  We confirmed that her metformin dose is 1500 mg daily with dinner.  We have updated her medication list to reflect this dose. Barbara Parks will contact [facility] to schedule her next shipment, number provided, so that she does not run out of medication while on vacation. ?  ?Dose Modifications ?Abemaciclib 100 mg every 12 hours. May increase to 150 mg if tolerated at 12/27/21 visit. ? ?  Access Assessment ?Barbara Parks will be receiving abemaciclib through [facility] ?Insurance Concerns: None ?Start date if known: 11/14/21 ? ?Allergies ?Allergies  ?Allergen Reactions  ? Aspirin   ?  REACTION: upset stomach  ? Latex Other (See Comments)  ?  Blistering and skin peels off  ? Nsaids Nausea And Vomiting  ?  Extreme nausea and vomiting  ? ? ?Vitals ? ?  11/15/2021  ? 12:56 PM 10/18/2021  ? 11:45 AM 09/20/2021  ? 11:00 AM  ?Vitals with BMI  ?Height  '5\' 4"'$    ?Weight 155 lbs 3 oz 154 lbs 6 oz   ?BMI 26.63 26.49   ?Systolic  277 824 235  ?Diastolic 64 68 69  ?Pulse 110 115 114  ?  ? ?Laboratory Data ? ?  Latest Ref Rng & Units 11/15/2021  ? 12:01 PM 10/18/2021  ? 11:29 AM 08/23/2021  ? 11:14 AM  ?CBC EXTENDED  ?WBC 4.0 - 10.5 K/uL 4.6   4.6   4.8    ?RBC 3.87 - 5.11 MIL/uL 4.01   3.88   3.89    ?Hemoglobin 12.0 - 15.0 g/dL 13.9   13.6   13.5    ?HCT 36.0 - 46.0 % 41.4   40.3   40.1    ?Platelets 150 - 400 K/uL 218   199   168    ?NEUT# 1.7 - 7.7 K/uL 3.3   3.5   3.7    ?Lymph# 0.7 - 4.0 K/uL 0.6   0.5   0.5    ?  ? ?  Latest Ref Rng & Units 11/15/2021  ? 12:01 PM 10/18/2021  ? 11:29 AM 08/23/2021  ? 11:14 AM  ?CMP  ?Glucose 70 - 99 mg/dL 97   93   124    ?BUN 8 - 23 mg/dL '19   30   22    '$ ?Creatinine 0.44 - 1.00 mg/dL 1.31   1.30   1.23    ?Sodium 135 - 145 mmol/L 137   136   137    ?Potassium 3.5 - 5.1 mmol/L 5.0   4.8   4.7    ?Chloride 98 - 111 mmol/L 106   106   107    ?CO2 22 - 32 mmol/L '24   22   23    '$ ?Calcium 8.9 - 10.3 mg/dL 9.5   10.0   9.9    ?Total Protein 6.5 - 8.1 g/dL 7.9   7.7   7.6    ?Total Bilirubin 0.3 - 1.2 mg/dL 0.3   0.3   0.3    ?Alkaline Phos 38 - 126 U/L 94   88   90    ?AST 15 - 41 U/L '15   14   13    '$ ?ALT 0 - 44 U/L '9   9   8    '$ ? ?No results found for: MG  ? ?Contraindications ?Contraindications were reviewed?  Yes ?Contraindications to therapy were identified?  No ? ?Safety Precautions ?The following safety precautions for the use of abemaciclib were reviewed:  ?Diarrhea: we reviewed that diarrhea is common with abemaciclib and confirmed that she does have loperamide (Imodium) at home.  We reviewed how to take this medication PRN and gave her information on abemaciclib ?Neutropenia: we discussed the importance of having a thermometer and what the Centers for Disease Control and Prevention (CDC) considers a fever which is 100.4?F (38?C) or higher.  Gave patient 24/7 triage line to call if any fevers or  symptoms ?ILD/Pneumonitis: we reviewed potential symptoms including cough, shortness, and  fatigue. ?Hepatotoxicity: reviewed to contact clinic for RUQ pain that will not subside, yellowing of eyes/skin ?Venous thromboembolism (VTE): reviewed signs of deep vein thrombosis (DVT) such as leg swelling, redness, pain, or tenderness and signs of pulmonary embolism (PE) such as shortness of breath, rapid or irregular heartbeat, cough, chest pain, or lightheadedness ?Reviewed to take the medication every 12 hours (with food sometimes can be easier on the stomach) and to take it at the same time every day. Discussed proper storage and handling of abemaciclib ?Storage / Handling ?Missed doses ? ?Medication Reconciliation ?Current Outpatient Medications  ?Medication Sig Dispense Refill  ? abemaciclib (VERZENIO) 100 MG tablet Take 1 tablet (100 mg total) by mouth 2 (two) times daily. Swallow tablets whole. Do not chew, crush, or split tablets before swallowing. 60 tablet 3  ? cholecalciferol (VITAMIN D3) 25 MCG (1000 UT) tablet Take 2 tablets (2,000 Units total) by mouth daily. 180 tablet 4  ? gemfibrozil (LOPID) 600 MG tablet Take 1 tablet (600 mg total) by mouth 2 (two) times daily before a meal. 180 tablet 2  ? loperamide (IMODIUM) 2 MG capsule Take 2 mg by mouth 4 (four) times daily as needed for diarrhea or loose stools. Reported on 11/30/2015    ? metFORMIN (GLUCOPHAGE-XR) 500 MG 24 hr tablet Take 1,500 mg by mouth daily with supper. Per patient, 1500 mg daily with supper. Followed by [facility]  3  ? prochlorperazine (COMPAZINE) 10 MG tablet Take 1 tablet (10 mg total) by mouth every 8 (eight) hours as needed for nausea or vomiting. 30 tablet 0  ? ?No current facility-administered medications for this visit.  ? ? ?Medication reconciliation is based on the patient's most recent medication list in the electronic medical record (EMR) including herbal products and OTC medications.  ? ?The patient's medication list was reviewed today with the patient?  Yes ? ?Drug-drug interactions (DDIs) ?DDIs were evaluated?   Yes ?Significant DDIs identified?  No ? ?Drug-Food Interactions ?Drug-food interactions were evaluated?  Yes ?Drug-food interactions identified?  Avoid grapefruit products ? ?Follow-up Plan  ?Fulvestrant and denosumab 120 mg

## 2021-11-16 ENCOUNTER — Telehealth: Payer: Self-pay | Admitting: Pharmacist

## 2021-11-16 LAB — CANCER ANTIGEN 27.29: CA 27.29: 647.3 U/mL — ABNORMAL HIGH (ref 0.0–38.6)

## 2021-11-16 NOTE — Telephone Encounter (Signed)
Scheduled appointment per 04/21 los. Appointments may appear off track due to patient being out of town. Patient is aware.  ?

## 2021-11-29 ENCOUNTER — Encounter: Payer: Self-pay | Admitting: Pharmacist

## 2021-11-29 ENCOUNTER — Inpatient Hospital Stay: Payer: Medicare Other | Attending: Oncology

## 2021-11-29 ENCOUNTER — Inpatient Hospital Stay: Payer: Medicare Other | Admitting: Pharmacist

## 2021-11-29 VITALS — BP 119/70 | HR 111 | Temp 97.8°F | Wt 153.8 lb

## 2021-11-29 DIAGNOSIS — R911 Solitary pulmonary nodule: Secondary | ICD-10-CM | POA: Insufficient documentation

## 2021-11-29 DIAGNOSIS — C50411 Malignant neoplasm of upper-outer quadrant of right female breast: Secondary | ICD-10-CM | POA: Diagnosis not present

## 2021-11-29 DIAGNOSIS — Z79899 Other long term (current) drug therapy: Secondary | ICD-10-CM | POA: Diagnosis not present

## 2021-11-29 DIAGNOSIS — Z5111 Encounter for antineoplastic chemotherapy: Secondary | ICD-10-CM | POA: Insufficient documentation

## 2021-11-29 DIAGNOSIS — Z170 Estrogen receptor positive status [ER+]: Secondary | ICD-10-CM | POA: Insufficient documentation

## 2021-11-29 DIAGNOSIS — Z9011 Acquired absence of right breast and nipple: Secondary | ICD-10-CM | POA: Diagnosis not present

## 2021-11-29 DIAGNOSIS — C7951 Secondary malignant neoplasm of bone: Secondary | ICD-10-CM

## 2021-11-29 DIAGNOSIS — I3139 Other pericardial effusion (noninflammatory): Secondary | ICD-10-CM | POA: Insufficient documentation

## 2021-11-29 DIAGNOSIS — J90 Pleural effusion, not elsewhere classified: Secondary | ICD-10-CM | POA: Insufficient documentation

## 2021-11-29 LAB — CMP (CANCER CENTER ONLY)
ALT: 8 U/L (ref 0–44)
AST: 15 U/L (ref 15–41)
Albumin: 4.5 g/dL (ref 3.5–5.0)
Alkaline Phosphatase: 111 U/L (ref 38–126)
Anion gap: 8 (ref 5–15)
BUN: 27 mg/dL — ABNORMAL HIGH (ref 8–23)
CO2: 24 mmol/L (ref 22–32)
Calcium: 10.4 mg/dL — ABNORMAL HIGH (ref 8.9–10.3)
Chloride: 103 mmol/L (ref 98–111)
Creatinine: 1.54 mg/dL — ABNORMAL HIGH (ref 0.44–1.00)
GFR, Estimated: 36 mL/min — ABNORMAL LOW (ref >60)
Glucose, Bld: 94 mg/dL (ref 70–99)
Potassium: 4.8 mmol/L (ref 3.5–5.1)
Sodium: 135 mmol/L (ref 135–145)
Total Bilirubin: 0.3 mg/dL (ref 0.3–1.2)
Total Protein: 8 g/dL (ref 6.5–8.1)

## 2021-11-29 LAB — CBC WITH DIFFERENTIAL (CANCER CENTER ONLY)
Abs Immature Granulocytes: 0.09 10*3/uL — ABNORMAL HIGH (ref 0.00–0.07)
Basophils Absolute: 0.2 10*3/uL — ABNORMAL HIGH (ref 0.0–0.1)
Basophils Relative: 2 %
Eosinophils Absolute: 0.1 10*3/uL (ref 0.0–0.5)
Eosinophils Relative: 1 %
HCT: 40.1 % (ref 36.0–46.0)
Hemoglobin: 14 g/dL (ref 12.0–15.0)
Immature Granulocytes: 1 %
Lymphocytes Relative: 12 %
Lymphs Abs: 0.9 10*3/uL (ref 0.7–4.0)
MCH: 35.7 pg — ABNORMAL HIGH (ref 26.0–34.0)
MCHC: 34.9 g/dL (ref 30.0–36.0)
MCV: 102.3 fL — ABNORMAL HIGH (ref 80.0–100.0)
Monocytes Absolute: 1.1 10*3/uL — ABNORMAL HIGH (ref 0.1–1.0)
Monocytes Relative: 14 %
Neutro Abs: 5.4 10*3/uL (ref 1.7–7.7)
Neutrophils Relative %: 70 %
Platelet Count: 271 10*3/uL (ref 150–400)
RBC: 3.92 MIL/uL (ref 3.87–5.11)
RDW: 14.4 % (ref 11.5–15.5)
WBC Count: 7.7 10*3/uL (ref 4.0–10.5)
nRBC: 0 % (ref 0.0–0.2)

## 2021-11-29 NOTE — Progress Notes (Signed)
[place]  ?     Telephone: [phone]?Fax: [phone]  ? ?Oncology Clinical Pharmacist Practitioner Progress Note ? ?Barbara Parks was contacted via in-person to discuss her chemotherapy regimen for abemaciclib which they receive under the care of Dr. Nicholas Lose. ? ?Current treatment regimen and start date ?Abemaciclib (11/14/21) ?Fulvestrant (08/24/19) ?Denosumab 120 mg (02/25/19) -- on hold (root canal scheduled for July, then 3 month hold after), last given 11/15/21 ? ?Interval History ?She continues on abemaciclib 100 mg by mouth every 12 hours on days 1 to 28 of a 28-day cycle. This is being given  in combination with fulvestrant and denosumab 120 mg . Therapy is planned to continue until disease progression or unacceptable toxicity.  ? ?Response to Therapy ?Barbara Parks was seen today by clinical pharmacy as follow-up for her abemaciclib management.  She last saw clinical pharmacy on 11/15/21.  She states that she is doing well.  She does not report any diarrhea since starting abemaciclib.  She also states that she has been able to get her shipment of abemaciclib from [facility].  We again discussed today that if she is tolerating abemaciclib well, she could consider increasing the dose to 150 mg every 12 hours when she sees clinical pharmacy again on 6 5/23.  She does report some joint pain which she states she takes ibuprofen and acetaminophen for.  She says this alleviates the symptoms.  She also is reporting some fatigue.  She does remain very active working in the yard, and golfing.  Her heart rate today was slightly elevated and this may have been due to to her having to go back and forth from the clinic office to obtaining labs and then back fairly quickly.  She does state that she has been drinking plenty of fluids, is not feeling dizzy, and is looking forward to her upcoming trip to [place].  We did recommend that she get up and move around periodically while she is  traveling due to the potential risk of blood clots with abemaciclib.  We also discussed that she should keep her abemaciclib at room temperature and out of direct sunlight.  We also recommended that she take a thermometer with her and report any fevers to the clinic, even if traveling.  She verbalized understanding of the plan. ? ?She will tentatively see Dr. Lindi Adie with labs on 12/17/21 and then see clinical pharmacy again on 12/31/21 with labs.  CMP had not resulted at time of visit.  Serum creatinine slightly elevated from last visit and calcium today slightly elevated.  We did contact [name] after our visit and recommended holding vitamin D3 until her next visit with Dr. Lindi Adie and continuing to drink plenty of fluids.  Labs, vitals, treatment parameters, and manufacturer guidelines assessing toxicity were reviewed with Barbara Parks today. Based on these values, patient is in agreement to continue abemaciclib therapy at this time. ? ?Allergies ?Allergies  ?Allergen Reactions  ? Aspirin   ?  REACTION: upset stomach  ? Latex Other (See Comments)  ?  Blistering and skin peels off  ? Nsaids Nausea And Vomiting  ?  Extreme nausea and vomiting  ? ? ?Vitals ? ?  11/29/2021  ?  1:47 PM 11/15/2021  ? 12:56 PM 10/18/2021  ? 11:45 AM  ?Vitals with BMI  ?Height   '5\' 4"'$   ?Weight 153 lbs 13 oz 155 lbs 3 oz 154 lbs 6 oz  ?BMI  26.63 26.49  ?Systolic 932 355  620  ?Diastolic 70 64 68  ?Pulse 111 110 115  ?  ? ?Laboratory Data ? ?  Latest Ref Rng & Units 11/29/2021  ?  1:38 PM 11/15/2021  ? 12:01 PM 10/18/2021  ? 11:29 AM  ?CBC EXTENDED  ?WBC 4.0 - 10.5 K/uL 7.7   4.6   4.6    ?RBC 3.87 - 5.11 MIL/uL 3.92   4.01   3.88    ?Hemoglobin 12.0 - 15.0 g/dL 14.0   13.9   13.6    ?HCT 36.0 - 46.0 % 40.1   41.4   40.3    ?Platelets 150 - 400 K/uL 271   218   199    ?NEUT# 1.7 - 7.7 K/uL 5.4   3.3   3.5    ?Lymph# 0.7 - 4.0 K/uL 0.9   0.6   0.5    ?  ? ?  Latest Ref Rng & Units 11/29/2021  ?  1:38 PM 11/15/2021  ? 12:01 PM 10/18/2021  ? 11:29  AM  ?CMP  ?Glucose 70 - 99 mg/dL 94   97   93    ?BUN 8 - 23 mg/dL '27   19   30    '$ ?Creatinine 0.44 - 1.00 mg/dL 1.54   1.31   1.30    ?Sodium 135 - 145 mmol/L 135   137   136    ?Potassium 3.5 - 5.1 mmol/L 4.8   5.0   4.8    ?Chloride 98 - 111 mmol/L 103   106   106    ?CO2 22 - 32 mmol/L '24   24   22    '$ ?Calcium 8.9 - 10.3 mg/dL 10.4   9.5   10.0    ?Total Protein 6.5 - 8.1 g/dL 8.0   7.9   7.7    ?Total Bilirubin 0.3 - 1.2 mg/dL 0.3   0.3   0.3    ?Alkaline Phos 38 - 126 U/L 111   94   88    ?AST 15 - 41 U/L '15   15   14    '$ ?ALT 0 - 44 U/L '8   9   9    '$ ?  ?No results found for: MG ? ?Adverse Effects Assessment ?Joint pain: She is using acetaminophen and ibuprofen with success.  We will continue to monitor.  She has been on fulvestrant since 08/24/19 ?Elevated heart rate: 111 bpm today.  Has been elevated at prior visits.  We will continue to monitor.  She did have to move fairly quickly today from the clinic visit to the lab area and then back to the clinic area attributed to the value. ?Fatigue: We will continue to monitor.  Currently it is not affecting her quality of life but she does feel that it has increased since starting abemaciclib. ? ?Adherence Assessment ?Barbara Parks reports missing 0 doses over the past 2 weeks.   ?Reason for missed dose: N/A ?Patient was re-educated on importance of adherence.  ? ?Access Assessment ?Barbara Parks is currently receiving her abemaciclib through [facility] ?Insurance concerns: None ? ?Medication Reconciliation ?The patient's medication list was reviewed today with the patient?  Yes ?New medications or herbal supplements have recently been started?  No ?Any medications have been discontinued?  Now ?The medication list was updated and reconciled based on the patient's most recent medication list in the electronic medical record (EMR) including herbal products and OTC medications.  ? ?Medications ?Current Outpatient Medications  ?  Medication Sig Dispense Refill  ?  abemaciclib (VERZENIO) 100 MG tablet Take 1 tablet (100 mg total) by mouth 2 (two) times daily. Swallow tablets whole. Do not chew, crush, or split tablets before swallowing. 60 tablet 3  ? cholecalciferol (VITAMIN D3) 25 MCG (1000 UT) tablet Take 2 tablets (2,000 Units total) by mouth daily. 180 tablet 4  ? gemfibrozil (LOPID) 600 MG tablet Take 1 tablet (600 mg total) by mouth 2 (two) times daily before a meal. 180 tablet 2  ? loperamide (IMODIUM) 2 MG capsule Take 2 mg by mouth 4 (four) times daily as needed for diarrhea or loose stools. Reported on 11/30/2015    ? metFORMIN (GLUCOPHAGE-XR) 500 MG 24 hr tablet Take 1,500 mg by mouth daily with supper. Per patient, 1500 mg daily with supper. Followed by [facility]  3  ? prochlorperazine (COMPAZINE) 10 MG tablet Take 1 tablet (10 mg total) by mouth every 8 (eight) hours as needed for nausea or vomiting. 30 tablet 0  ? ?No current facility-administered medications for this visit.  ? ? ?Drug-Drug Interactions (DDIs) ?DDIs were evaluated?  Yes ?Significant DDIs?  No ?The patient was instructed to speak with their health care provider and/or the oral chemotherapy pharmacist before starting any new drug, including prescription or over the counter, natural / herbal products, or vitamins. ? ?Supportive Care ?Diarrhea: we reviewed that diarrhea is common with abemaciclib and confirmed that she does have loperamide (Imodium) at home.  We reviewed how to take this medication PRN ?Neutropenia: we discussed the importance of having a thermometer and what the Centers for Disease Control and Prevention (CDC) considers a fever which is 100.4?F (38?C) or higher.  Gave patient 24/7 triage line to call if any fevers or symptoms ?ILD/Pneumonitis: we reviewed potential symptoms including cough, shortness, and fatigue.  ?Hepatotoxicity: Within normal limits ?VTE: reviewed signs of DVT such as leg swelling, redness, pain, or tenderness and signs of PE such as shortness of breath,  rapid or irregular heartbeat, cough, chest pain, or lightheadedness ?Reviewed to take the medication every 12 hours (with food sometimes can be easier on the stomach) and to take it at the same time every day.  ?

## 2021-11-30 LAB — CANCER ANTIGEN 27.29: CA 27.29: 694.7 U/mL — ABNORMAL HIGH (ref 0.0–38.6)

## 2021-12-01 DIAGNOSIS — Z20822 Contact with and (suspected) exposure to covid-19: Secondary | ICD-10-CM | POA: Diagnosis not present

## 2021-12-07 NOTE — Progress Notes (Signed)
Patient Care Team: [name], MD as PCP - General (Family Medicine) Jerline Pain, MD (Cardiology) Gaye Pollack, MD (Cardiothoracic Surgery) Raina Mina, RPH-CPP (Pharmacist) Renaldo Harrison, MD as Referring Physician (Radiation Oncology) Nicholas Lose, MD as Consulting Physician (Hematology and Oncology)  DIAGNOSIS:  Encounter Diagnosis  Name Primary?   Malignant neoplasm of upper-outer quadrant of right breast in female, estrogen receptor positive ([place])     SUMMARY OF ONCOLOGIC HISTORY: Oncology History  Malignant neoplasm of upper-outer quadrant of right breast in female, estrogen receptor positive ([place])  03/22/2004 Initial Diagnosis   right mastectomy and sentinel lymph node sampling 03/22/2004 for a right upper-outer quadrant pT2 pN1, stage IIB invasive ductal carcinoma with lobular features, grade 2, estrogen receptor and progesterone receptor positive, HER-2 negative, with an MIB-1 of 9%   05/2004 - 09/18/2004 Chemotherapy   adjuvant chemotherapy with dose dense doxorubicin and cyclophosphamide x4 cycles (first cycle delayed one week) followed by dose dense paclitaxel x4 was completed 09/18/2004   09/2004 - 2009 Anti-estrogen oral therapy   tamoxifen started March 2006, discontinued 2009   08/2011 Relapse/Recurrence   Metastatic disease:large pericardial effusion, large right pleural effusion and possible right middle lobe bronchial obstruction  status post pericardial window placement, fiberoptic bronchoscopy and right Pleurx placement 09/11/2011, with biopsy of the bronchus intermedius and pericardium positive for metastatic breast cancer, ER 91%, PR 100% p with an MIB-1 of 35%, and no HER-2 amplification, ratio being 1.37   10/10/2011 - 07/2019 Anti-estrogen oral therapy   BOLERO-4 trial 10/10/2011, receiving letrozole and everolimus (stopped for progression)   08/16/2019 Miscellaneous   foundation 1 requested on liver biopsy from 08/16/2019 shows a stable  microsatellite status with 1 mutation/MB, amplification of C11orf30, FGF10 and RICTOR, with mutations in ESR1 and PTEN   08/24/2019 -  Anti-estrogen oral therapy   fulvestrant started 08/24/2019, repeated every 4 weeks Palbociclib 125 mg daily 21 days on 7 days off started 08/25/2019 Switched to ribociclib s starting 08/22/2020   02/28/2021 -  Radiation Therapy   palliative radiation (Dr. Lynnette Caffey in [place]) 02/28/2021 to right hip area     CHIEF COMPLIANT: Follow-up of metastatic breast cancer, currently on Verzenio    INTERVAL HISTORY: Barbara Parks is a 71 y.o. with above-mentioned history of metastatic breast cancer, formerly on BOLERO study. She presents to the clinic today for follow-up. She states that she had some diarrhea goes about 4 times a day. She states that she has some fatigue.   ALLERGIES:  is allergic to aspirin, latex, and nsaids.  MEDICATIONS:  Current Outpatient Medications  Medication Sig Dispense Refill   abemaciclib (VERZENIO) 100 MG tablet Take 1 tablet (100 mg total) by mouth 2 (two) times daily. Swallow tablets whole. Do not chew, crush, or split tablets before swallowing. 60 tablet 3   cholecalciferol (VITAMIN D3) 25 MCG (1000 UT) tablet Take 2 tablets (2,000 Units total) by mouth daily. 180 tablet 4   gemfibrozil (LOPID) 600 MG tablet Take 1 tablet (600 mg total) by mouth 2 (two) times daily before a meal. 180 tablet 2   loperamide (IMODIUM) 2 MG capsule Take 2 mg by mouth 4 (four) times daily as needed for diarrhea or loose stools. Reported on 11/30/2015     metFORMIN (GLUCOPHAGE-XR) 500 MG 24 hr tablet Take 1,500 mg by mouth daily with supper. Per patient, 1500 mg daily with supper. Followed by [facility]  3   prochlorperazine (COMPAZINE) 10 MG tablet Take 1 tablet (10 mg  total) by mouth every 8 (eight) hours as needed for nausea or vomiting. 30 tablet 0   No current facility-administered medications for this visit.    PHYSICAL EXAMINATION: ECOG  PERFORMANCE STATUS: 1 - Symptomatic but completely ambulatory  Vitals:   12/17/21 1411  BP: 132/72  Pulse: (!) 111  Resp: 18  Temp: 97.8 F (36.6 C)  SpO2: 97%   Filed Weights   12/17/21 1411  Weight: 159 lb 12.8 oz (72.5 kg)      LABORATORY DATA:  I have reviewed the data as listed    Latest Ref Rng & Units 11/29/2021    1:38 PM 11/15/2021   12:01 PM 10/18/2021   11:29 AM  CMP  Glucose 70 - 99 mg/dL 94   97   93    BUN 8 - 23 mg/dL 27   19   30     Creatinine 0.44 - 1.00 mg/dL 1.54   1.31   1.30    Sodium 135 - 145 mmol/L 135   137   136    Potassium 3.5 - 5.1 mmol/L 4.8   5.0   4.8    Chloride 98 - 111 mmol/L 103   106   106    CO2 22 - 32 mmol/L 24   24   22     Calcium 8.9 - 10.3 mg/dL 10.4   9.5   10.0    Total Protein 6.5 - 8.1 g/dL 8.0   7.9   7.7    Total Bilirubin 0.3 - 1.2 mg/dL 0.3   0.3   0.3    Alkaline Phos 38 - 126 U/L 111   94   88    AST 15 - 41 U/L 15   15   14     ALT 0 - 44 U/L 8   9   9       Lab Results  Component Value Date   WBC 7.8 12/17/2021   HGB 13.3 12/17/2021   HCT 38.2 12/17/2021   MCV 101.1 (H) 12/17/2021   PLT 298 12/17/2021   NEUTROABS 5.9 12/17/2021    ASSESSMENT & PLAN:  Malignant neoplasm of upper-outer quadrant of right breast in female, estrogen receptor positive ([place]) Metastatic breast cancer (pericardial effusion, pleural effusion, right middle lobe lung lesion, ER 91%, PR 90%, Ki-67 35%, HER2 negative diagnosed February 2013)   (Prior history of right mastectomy 2005, adjuvant chemo with Adriamycin Cytoxan and Taxol followed by tamoxifen)   Current treatment: Ribociclib, fulvestrant, Xgeva, ribociclib discontinued 10/18/2021 switching to abemaciclib as soon as it becomes available because of progression of disease    Scans: liver MRI 10/14/2021: Multiple stable to slightly enlarged hepatic metastatic lesions suggesting minimal progression, unchanged thoracic and lumbar vertebral metastases     Recommendation: 1.   abemaciclib at 100 mg p.o. twice daily 2. monthly for Faslodex injections  Toxicities: Intermittent diarrhea and fatigue  Bone metastasis: On Xgeva: monthly at this time. She may have a root canal done in end of July and we might have to hold Xgeva for 3 months after that.  We will obtain a liver MRI in June. We will keep the dosage of Verzinio the same. Return to clinic in 4 weeks to see Jenny Reichmann.    Orders Placed This Encounter  Procedures   MR LIVER W WO CONTRAST    Standing Status:   Future    Standing Expiration Date:   12/18/2022    Order Specific Question:   If indicated for the  ordered procedure, I authorize the administration of contrast media per Radiology protocol    Answer:   Yes    Order Specific Question:   What is the patient's sedation requirement?    Answer:   No Sedation    Order Specific Question:   Does the patient have a pacemaker or implanted devices?    Answer:   No    Order Specific Question:   Release to patient    Answer:   Immediate    Order Specific Question:   Preferred imaging location?    Answer:   [facility] (table limit-550lbs)   The patient has a good understanding of the overall plan. she agrees with it. she will call with any problems that may develop before the next visit here. Total time spent: 30 mins including face to face time and time spent for planning, charting and co-ordination of care   Harriette Ohara, MD 12/17/21    I Gardiner Coins am scribing for Dr. Lindi Adie  I have reviewed the above documentation for accuracy and completeness, and I agree with the above.

## 2021-12-13 ENCOUNTER — Ambulatory Visit: Payer: Medicare Other

## 2021-12-17 ENCOUNTER — Inpatient Hospital Stay: Payer: Medicare Other

## 2021-12-17 ENCOUNTER — Other Ambulatory Visit: Payer: Self-pay

## 2021-12-17 ENCOUNTER — Inpatient Hospital Stay (HOSPITAL_BASED_OUTPATIENT_CLINIC_OR_DEPARTMENT_OTHER): Payer: Medicare Other | Admitting: Hematology and Oncology

## 2021-12-17 DIAGNOSIS — C7951 Secondary malignant neoplasm of bone: Secondary | ICD-10-CM | POA: Diagnosis not present

## 2021-12-17 DIAGNOSIS — R911 Solitary pulmonary nodule: Secondary | ICD-10-CM | POA: Diagnosis not present

## 2021-12-17 DIAGNOSIS — C50911 Malignant neoplasm of unspecified site of right female breast: Secondary | ICD-10-CM

## 2021-12-17 DIAGNOSIS — Z170 Estrogen receptor positive status [ER+]: Secondary | ICD-10-CM | POA: Diagnosis not present

## 2021-12-17 DIAGNOSIS — C50411 Malignant neoplasm of upper-outer quadrant of right female breast: Secondary | ICD-10-CM

## 2021-12-17 DIAGNOSIS — J90 Pleural effusion, not elsewhere classified: Secondary | ICD-10-CM | POA: Diagnosis not present

## 2021-12-17 DIAGNOSIS — Z5111 Encounter for antineoplastic chemotherapy: Secondary | ICD-10-CM | POA: Diagnosis not present

## 2021-12-17 LAB — CBC WITH DIFFERENTIAL (CANCER CENTER ONLY)
Abs Immature Granulocytes: 0.05 10*3/uL (ref 0.00–0.07)
Basophils Absolute: 0.2 10*3/uL — ABNORMAL HIGH (ref 0.0–0.1)
Basophils Relative: 2 %
Eosinophils Absolute: 0.1 10*3/uL (ref 0.0–0.5)
Eosinophils Relative: 1 %
HCT: 38.2 % (ref 36.0–46.0)
Hemoglobin: 13.3 g/dL (ref 12.0–15.0)
Immature Granulocytes: 1 %
Lymphocytes Relative: 10 %
Lymphs Abs: 0.8 10*3/uL (ref 0.7–4.0)
MCH: 35.2 pg — ABNORMAL HIGH (ref 26.0–34.0)
MCHC: 34.8 g/dL (ref 30.0–36.0)
MCV: 101.1 fL — ABNORMAL HIGH (ref 80.0–100.0)
Monocytes Absolute: 0.9 10*3/uL (ref 0.1–1.0)
Monocytes Relative: 11 %
Neutro Abs: 5.9 10*3/uL (ref 1.7–7.7)
Neutrophils Relative %: 75 %
Platelet Count: 298 10*3/uL (ref 150–400)
RBC: 3.78 MIL/uL — ABNORMAL LOW (ref 3.87–5.11)
RDW: 13.2 % (ref 11.5–15.5)
WBC Count: 7.8 10*3/uL (ref 4.0–10.5)
nRBC: 0 % (ref 0.0–0.2)

## 2021-12-17 LAB — CMP (CANCER CENTER ONLY)
ALT: 8 U/L (ref 0–44)
AST: 15 U/L (ref 15–41)
Albumin: 4.3 g/dL (ref 3.5–5.0)
Alkaline Phosphatase: 91 U/L (ref 38–126)
Anion gap: 8 (ref 5–15)
BUN: 26 mg/dL — ABNORMAL HIGH (ref 8–23)
CO2: 21 mmol/L — ABNORMAL LOW (ref 22–32)
Calcium: 9.9 mg/dL (ref 8.9–10.3)
Chloride: 107 mmol/L (ref 98–111)
Creatinine: 1.45 mg/dL — ABNORMAL HIGH (ref 0.44–1.00)
GFR, Estimated: 39 mL/min — ABNORMAL LOW (ref >60)
Glucose, Bld: 110 mg/dL — ABNORMAL HIGH (ref 70–99)
Potassium: 4.5 mmol/L (ref 3.5–5.1)
Sodium: 136 mmol/L (ref 135–145)
Total Bilirubin: 0.3 mg/dL (ref 0.3–1.2)
Total Protein: 7.9 g/dL (ref 6.5–8.1)

## 2021-12-17 MED ORDER — FULVESTRANT 250 MG/5ML IM SOSY
500.0000 mg | PREFILLED_SYRINGE | Freq: Once | INTRAMUSCULAR | Status: AC
  Administered 2021-12-17: 500 mg via INTRAMUSCULAR
  Filled 2021-12-17: qty 10

## 2021-12-17 NOTE — Assessment & Plan Note (Signed)
Metastatic breast cancer (pericardial effusion, pleural effusion, right middle lobe lung lesion, ER 91%, PR 90%, Ki-67 35%, HER2 negative diagnosed February 2013)  (Prior history of right mastectomy 2005, adjuvant chemo with Adriamycin Cytoxan and Taxol followed by tamoxifen)  Current treatment: Ribociclib, fulvestrant, Xgeva, ribociclib discontinued 10/18/2021 switching to abemaciclib as soon as it becomes available because of progression of disease   Scans: liver MRI 10/14/2021: Multiple stable to slightly enlarged hepatic metastatic lesions suggesting minimal progression, unchanged thoracic and lumbar vertebral metastases   Recommendation: 1. abemaciclib at 100 mg p.o. twice daily 2. monthly for Faslodex injections Bone metastasis: On Xgeva: I recommended moving Xgeva to every 3 months.  She tells me that since we moved her Delton See to every 3 months her bone pain is increased.  So we will move the Xgeva to monthly at this time.  Return to clinic in 4 weeks to see Jenny Reichmann.

## 2021-12-18 LAB — CANCER ANTIGEN 27.29: CA 27.29: 659.8 U/mL — ABNORMAL HIGH (ref 0.0–38.6)

## 2021-12-31 ENCOUNTER — Other Ambulatory Visit: Payer: Self-pay

## 2021-12-31 ENCOUNTER — Inpatient Hospital Stay: Payer: Medicare Other | Attending: Oncology | Admitting: Pharmacist

## 2021-12-31 ENCOUNTER — Inpatient Hospital Stay: Payer: Medicare Other

## 2021-12-31 VITALS — BP 108/70 | HR 108 | Temp 98.1°F | Resp 18 | Ht 64.0 in

## 2021-12-31 DIAGNOSIS — I3139 Other pericardial effusion (noninflammatory): Secondary | ICD-10-CM | POA: Diagnosis not present

## 2021-12-31 DIAGNOSIS — Z79899 Other long term (current) drug therapy: Secondary | ICD-10-CM | POA: Diagnosis not present

## 2021-12-31 DIAGNOSIS — C7951 Secondary malignant neoplasm of bone: Secondary | ICD-10-CM | POA: Insufficient documentation

## 2021-12-31 DIAGNOSIS — Z170 Estrogen receptor positive status [ER+]: Secondary | ICD-10-CM | POA: Diagnosis not present

## 2021-12-31 DIAGNOSIS — Z5111 Encounter for antineoplastic chemotherapy: Secondary | ICD-10-CM | POA: Insufficient documentation

## 2021-12-31 DIAGNOSIS — Z9011 Acquired absence of right breast and nipple: Secondary | ICD-10-CM | POA: Diagnosis not present

## 2021-12-31 DIAGNOSIS — C787 Secondary malignant neoplasm of liver and intrahepatic bile duct: Secondary | ICD-10-CM | POA: Insufficient documentation

## 2021-12-31 DIAGNOSIS — C50411 Malignant neoplasm of upper-outer quadrant of right female breast: Secondary | ICD-10-CM | POA: Insufficient documentation

## 2021-12-31 DIAGNOSIS — J90 Pleural effusion, not elsewhere classified: Secondary | ICD-10-CM | POA: Diagnosis not present

## 2021-12-31 LAB — CMP (CANCER CENTER ONLY)
ALT: 7 U/L (ref 0–44)
AST: 13 U/L — ABNORMAL LOW (ref 15–41)
Albumin: 4.3 g/dL (ref 3.5–5.0)
Alkaline Phosphatase: 100 U/L (ref 38–126)
Anion gap: 8 (ref 5–15)
BUN: 26 mg/dL — ABNORMAL HIGH (ref 8–23)
CO2: 23 mmol/L (ref 22–32)
Calcium: 10.5 mg/dL — ABNORMAL HIGH (ref 8.9–10.3)
Chloride: 105 mmol/L (ref 98–111)
Creatinine: 1.39 mg/dL — ABNORMAL HIGH (ref 0.44–1.00)
GFR, Estimated: 41 mL/min — ABNORMAL LOW (ref >60)
Glucose, Bld: 92 mg/dL (ref 70–99)
Potassium: 4.3 mmol/L (ref 3.5–5.1)
Sodium: 136 mmol/L (ref 135–145)
Total Bilirubin: 0.3 mg/dL (ref 0.3–1.2)
Total Protein: 7.7 g/dL (ref 6.5–8.1)

## 2021-12-31 LAB — CBC WITH DIFFERENTIAL (CANCER CENTER ONLY)
Abs Immature Granulocytes: 0.05 10*3/uL (ref 0.00–0.07)
Basophils Absolute: 0.1 10*3/uL (ref 0.0–0.1)
Basophils Relative: 2 %
Eosinophils Absolute: 0.1 10*3/uL (ref 0.0–0.5)
Eosinophils Relative: 1 %
HCT: 40.2 % (ref 36.0–46.0)
Hemoglobin: 13.6 g/dL (ref 12.0–15.0)
Immature Granulocytes: 1 %
Lymphocytes Relative: 11 %
Lymphs Abs: 0.8 10*3/uL (ref 0.7–4.0)
MCH: 34.5 pg — ABNORMAL HIGH (ref 26.0–34.0)
MCHC: 33.8 g/dL (ref 30.0–36.0)
MCV: 102 fL — ABNORMAL HIGH (ref 80.0–100.0)
Monocytes Absolute: 0.8 10*3/uL (ref 0.1–1.0)
Monocytes Relative: 11 %
Neutro Abs: 5.7 10*3/uL (ref 1.7–7.7)
Neutrophils Relative %: 74 %
Platelet Count: 292 10*3/uL (ref 150–400)
RBC: 3.94 MIL/uL (ref 3.87–5.11)
RDW: 12.7 % (ref 11.5–15.5)
WBC Count: 7.6 10*3/uL (ref 4.0–10.5)
nRBC: 0 % (ref 0.0–0.2)

## 2021-12-31 MED ORDER — ONDANSETRON HCL 8 MG PO TABS
8.0000 mg | ORAL_TABLET | Freq: Three times a day (TID) | ORAL | 2 refills | Status: DC | PRN
Start: 2021-12-31 — End: 2022-06-13

## 2021-12-31 MED ORDER — PROCHLORPERAZINE MALEATE 10 MG PO TABS: 10.0000 mg | ORAL_TABLET | Freq: Three times a day (TID) | ORAL | 2 refills | Status: DC | PRN

## 2021-12-31 NOTE — Progress Notes (Signed)
[place]       Telephone: [phone]?Fax: [phone]   Oncology Clinical Pharmacist Practitioner Progress Note  Barbara Parks was contacted via in-person to discuss her chemotherapy regimen for abemaciclib which they receive under the care of Dr. Nicholas Lose.   Current treatment regimen and start date Abemaciclib (11/14/21) Fulvestrant (08/24/19) Denosumab 120 mg (02/25/19) -- on hold (root canal scheduled for July, then 3 month hold after), last given 11/15/21   Interval History She continues on abemaciclib 100 mg by mouth every 12 hours on days 1 to 28 of a 28-day cycle. This is being given  in combination with fulvestrant and denosumab 120 mg . Therapy is planned to continue until disease progression or unacceptable toxicity.   Response to Therapy Barbara Parks was seen today by clinical pharmacy as a follow-up to her abemaciclib management.  She was last seen by clinical pharmacy on 11/29/21 and Dr. Lindi Adie on 12/17/21.  Overall, she states that she is doing well.  She states in the last month she has had about 6 episodes of loose stool and each time she takes loperamide and the symptoms resolved.  She is having a little bit of nausea as well and we have gone ahead and prescribed ondansetron and prochlorperazine to her pharmacy of choice.  We reviewed how to take both of these medications.  She also describes some increased pain in her back, shoulder blades, and rib areas.  She does have known bone mets and she has been holding the denosumab 120 mg due to pain upcoming root canal.  She did state today that she will talk with Dr. Lindi Adie at her upcoming appointment in June if she does not have an appointment with her dentist by that time.  She likely will want to start denosumab again and just put off her root canal until a later time.  Dr. Lindi Adie recommends no dental procedures for 3 months before denosumab injections in 3 months after.  Her calcium is slightly elevated today,  similar to how it was at her last pharmacy visit.  She confirms that she is not taking any calcium or vitamin D supplements at this time.  We did recommend fluid intake and gave her some written information on symptoms to watch out for with hypercalcemia.  This will be rechecked in 1 month.  Barbara Parks states that she was being seen by Dr. Justin Mend, her PCP, but they have since retired.  She has an upcoming appointment next month with a new PA at that office which is [facility] per her report.  They have managed her diabetes medications in the past and will continue to do so.  She has an MRI of the liver scheduled for 01/11/22 and then a follow-up with Dr. Lindi Adie on 01/14/22.  This is when she is next due for fulvestrant and clinical pharmacy will then likely see her again in mid July.  Labs, vitals, treatment parameters, and manufacturer guidelines assessing toxicity were reviewed with Barbara Parks today. Based on these values, patient is in agreement to continue abemaciclib therapy at this time.  Allergies Allergies  Allergen Reactions   Aspirin     REACTION: upset stomach   Latex Other (See Comments)    Blistering and skin peels off   Nsaids Nausea And Vomiting    Extreme nausea and vomiting    Vitals    12/17/2021    2:11 PM 11/29/2021    1:47 PM 11/15/2021  12:56 PM  Vitals with BMI  Height '5\' 4"'$     Weight 159 lbs 13 oz 153 lbs 13 oz 155 lbs 3 oz  BMI 53.66  44.03  Systolic 474 259 563  Diastolic 72 70 64  Pulse 875 111 110     Laboratory Data    Latest Ref Rng & Units 12/17/2021    1:56 PM 11/29/2021    1:38 PM 11/15/2021   12:01 PM  CBC EXTENDED  WBC 4.0 - 10.5 K/uL 7.8   7.7   4.6    RBC 3.87 - 5.11 MIL/uL 3.78   3.92   4.01    Hemoglobin 12.0 - 15.0 g/dL 13.3   14.0   13.9    HCT 36.0 - 46.0 % 38.2   40.1   41.4    Platelets 150 - 400 K/uL 298   271   218    NEUT# 1.7 - 7.7 K/uL 5.9   5.4   3.3    Lymph# 0.7 - 4.0 K/uL 0.8   0.9   0.6         Latest Ref  Rng & Units 12/17/2021    1:56 PM 11/29/2021    1:38 PM 11/15/2021   12:01 PM  CMP  Glucose 70 - 99 mg/dL 110   94   97    BUN 8 - 23 mg/dL '26   27   19    '$ Creatinine 0.44 - 1.00 mg/dL 1.45   1.54   1.31    Sodium 135 - 145 mmol/L 136   135   137    Potassium 3.5 - 5.1 mmol/L 4.5   4.8   5.0    Chloride 98 - 111 mmol/L 107   103   106    CO2 22 - 32 mmol/L '21   24   24    '$ Calcium 8.9 - 10.3 mg/dL 9.9   10.4   9.5    Total Protein 6.5 - 8.1 g/dL 7.9   8.0   7.9    Total Bilirubin 0.3 - 1.2 mg/dL 0.3   0.3   0.3    Alkaline Phos 38 - 126 U/L 91   111   94    AST 15 - 41 U/L '15   15   15    '$ ALT 0 - 44 U/L '8   8   9      '$ No results found for: MG  Adverse Effects Assessment Diarrhea: Controlled with loperamide Nausea: No vomiting.  Prescribed ondansetron and prochlorperazine as needed.  These medications have been sent to her pharmacy of choice. Increased bone pain: She has been taking acetaminophen and ibuprofen with relief.  She does have tramadol at home and will use this as needed per the prescribing instructions.  She is going to considers restarting denosumab 120 mg when she next sees Dr. Lindi Adie in June.  She may then just hold off on getting her root canal. Increased calcium value: 10.5 mg/dL today.  Up from 9.9 mg/dL on 12/17/21.  We have recommended to hold all calcium and vitamin D supplements.  She will also increase fluid intake as her creatinine continues to be slightly elevated although has decreased.  Written information given on hypercalcemia and she was also recommended to avoid calcium rich containing foods  Adherence Assessment Barbara Parks reports missing 0 doses over the past 4 weeks.   Reason for missed dose: N/A Patient was re-educated on importance of adherence.  Access Assessment Barbara Parks is currently receiving her abemaciclib through [employer] concerns: None  Medication Reconciliation The patient's medication list was reviewed today with  the patient?  Yes New medications or herbal supplements have recently been started?  No Any medications have been discontinued?  No The medication list was updated and reconciled based on the patient's most recent medication list in the electronic medical record (EMR) including herbal products and OTC medications.   Medications Current Outpatient Medications  Medication Sig Dispense Refill   abemaciclib (VERZENIO) 100 MG tablet Take 1 tablet (100 mg total) by mouth 2 (two) times daily. Swallow tablets whole. Do not chew, crush, or split tablets before swallowing. 60 tablet 3   cholecalciferol (VITAMIN D3) 25 MCG (1000 UT) tablet Take 2 tablets (2,000 Units total) by mouth daily. 180 tablet 4   gemfibrozil (LOPID) 600 MG tablet Take 1 tablet (600 mg total) by mouth 2 (two) times daily before a meal. 180 tablet 2   loperamide (IMODIUM) 2 MG capsule Take 2 mg by mouth 4 (four) times daily as needed for diarrhea or loose stools. Reported on 11/30/2015     metFORMIN (GLUCOPHAGE-XR) 500 MG 24 hr tablet Take 1,500 mg by mouth daily with supper. Per patient, 1500 mg daily with supper. Followed by [facility]  3   prochlorperazine (COMPAZINE) 10 MG tablet Take 1 tablet (10 mg total) by mouth every 8 (eight) hours as needed for nausea or vomiting. 30 tablet 0   No current facility-administered medications for this visit.    Drug-Drug Interactions (DDIs) DDIs were evaluated?  Yes Significant DDIs?  No The patient was instructed to speak with their health care provider and/or the oral chemotherapy pharmacist before starting any new drug, including prescription or over the counter, natural / herbal products, or vitamins.  Supportive Care Diarrhea: we reviewed that diarrhea is common with abemaciclib and confirmed that she does have loperamide (Imodium) at home.  We reviewed how to take this medication PRN Nausea: As above prescribed ondansetron and prochlorperazine.  These were sent to her pharmacy of  choice and we reviewed how to take each of these agents. Neutropenia: we discussed the importance of having a thermometer and what the Centers for Disease Control and Prevention (CDC) considers a fever which is 100.40F (38C) or higher.  Gave patient 24/7 triage line to call if any fevers or symptoms ILD/Pneumonitis: we reviewed potential symptoms including cough, shortness, and fatigue.  VTE: reviewed signs of DVT such as leg swelling, redness, pain, or tenderness and signs of PE such as shortness of breath, rapid or irregular heartbeat, cough, chest pain, or lightheadedness Reviewed to take the medication every 12 hours (with food sometimes can be easier on the stomach) and to take it at the same time every day.   Dosing Assessment Hepatic adjustments needed?  No Renal adjustments needed?  No Toxicity adjustments needed?  Now The current dosing regimen is appropriate to continue at this time.  Follow-Up Plan Continue abemaciclib 100 mg every 12 hours MRI of the liver scheduled for 01/11/22 We will move pharmacy clinic visit on 01/14/22 2 Dr. Lindi Adie visit since he will be reviewing scans at this time.  Scheduled for fulvestrant on this date and may restart denosumab 120 mg if Dr. Lindi Adie approves and Barbara Parks agrees. We will schedule labs pharmacy clinic visit and fulvestrant injection for 02/11/22 Monitor calcium levels.  Had normalized at last visit with Dr. Lindi Adie on 12/17/21.  Today slightly elevated again.  Barbara Parks participated in the discussion, expressed understanding, and voiced agreement with the above plan. All questions were answered to her satisfaction. The patient was advised to contact the clinic at [phone] with any questions or concerns prior to her return visit.   I spent 30 minutes assessing and educating the patient.  Raina Mina, RPH-CPP, 12/31/2021  1:46 PM   **Disclaimer: This note was dictated with voice recognition software. Similar sounding words can  inadvertently be transcribed and this note may contain transcription errors which may not have been corrected upon publication of note.**

## 2022-01-01 ENCOUNTER — Telehealth: Payer: Self-pay | Admitting: Pharmacist

## 2022-01-01 LAB — CANCER ANTIGEN 27.29: CA 27.29: 676.3 U/mL — ABNORMAL HIGH (ref 0.0–38.6)

## 2022-01-01 NOTE — Telephone Encounter (Signed)
Scheduled appointment per 06/05 los. Left message.

## 2022-01-11 ENCOUNTER — Ambulatory Visit
Admission: RE | Admit: 2022-01-11 | Discharge: 2022-01-11 | Disposition: A | Discharge status: Home / Self Care | Payer: Medicare Other | Source: Ambulatory Visit | Attending: Hematology and Oncology | Admitting: Hematology and Oncology

## 2022-01-11 DIAGNOSIS — C7951 Secondary malignant neoplasm of bone: Secondary | ICD-10-CM | POA: Diagnosis not present

## 2022-01-11 DIAGNOSIS — Z853 Personal history of malignant neoplasm of breast: Secondary | ICD-10-CM | POA: Diagnosis not present

## 2022-01-11 DIAGNOSIS — K5710 Diverticulosis of small intestine without perforation or abscess without bleeding: Secondary | ICD-10-CM | POA: Diagnosis not present

## 2022-01-11 DIAGNOSIS — Z170 Estrogen receptor positive status [ER+]: Secondary | ICD-10-CM

## 2022-01-11 DIAGNOSIS — C787 Secondary malignant neoplasm of liver and intrahepatic bile duct: Secondary | ICD-10-CM | POA: Diagnosis not present

## 2022-01-11 MED ORDER — GADOBENATE DIMEGLUMINE 529 MG/ML IV SOLN
14.0000 mL | Freq: Once | INTRAVENOUS | Status: AC | PRN
  Administered 2022-01-11: 14 mL via INTRAVENOUS

## 2022-01-14 ENCOUNTER — Inpatient Hospital Stay (HOSPITAL_BASED_OUTPATIENT_CLINIC_OR_DEPARTMENT_OTHER): Payer: Medicare Other | Admitting: Hematology and Oncology

## 2022-01-14 ENCOUNTER — Other Ambulatory Visit: Payer: Self-pay

## 2022-01-14 ENCOUNTER — Inpatient Hospital Stay: Payer: Medicare Other

## 2022-01-14 ENCOUNTER — Other Ambulatory Visit: Payer: Self-pay | Admitting: Lab

## 2022-01-14 ENCOUNTER — Encounter: Payer: Self-pay | Admitting: Hematology and Oncology

## 2022-01-14 DIAGNOSIS — C50411 Malignant neoplasm of upper-outer quadrant of right female breast: Secondary | ICD-10-CM | POA: Diagnosis not present

## 2022-01-14 DIAGNOSIS — Z170 Estrogen receptor positive status [ER+]: Secondary | ICD-10-CM

## 2022-01-14 DIAGNOSIS — C50911 Malignant neoplasm of unspecified site of right female breast: Secondary | ICD-10-CM

## 2022-01-14 DIAGNOSIS — Z5111 Encounter for antineoplastic chemotherapy: Secondary | ICD-10-CM | POA: Diagnosis not present

## 2022-01-14 DIAGNOSIS — I3139 Other pericardial effusion (noninflammatory): Secondary | ICD-10-CM | POA: Diagnosis not present

## 2022-01-14 DIAGNOSIS — C787 Secondary malignant neoplasm of liver and intrahepatic bile duct: Secondary | ICD-10-CM | POA: Diagnosis not present

## 2022-01-14 DIAGNOSIS — C7951 Secondary malignant neoplasm of bone: Secondary | ICD-10-CM

## 2022-01-14 DIAGNOSIS — J90 Pleural effusion, not elsewhere classified: Secondary | ICD-10-CM | POA: Diagnosis not present

## 2022-01-14 DIAGNOSIS — C7800 Secondary malignant neoplasm of unspecified lung: Secondary | ICD-10-CM | POA: Diagnosis not present

## 2022-01-14 LAB — CBC WITH DIFFERENTIAL (CANCER CENTER ONLY)
Abs Immature Granulocytes: 0.07 10*3/uL (ref 0.00–0.07)
Basophils Absolute: 0.1 10*3/uL (ref 0.0–0.1)
Basophils Relative: 2 %
Eosinophils Absolute: 0.1 10*3/uL (ref 0.0–0.5)
Eosinophils Relative: 1 %
HCT: 39.9 % (ref 36.0–46.0)
Hemoglobin: 13.4 g/dL (ref 12.0–15.0)
Immature Granulocytes: 1 %
Lymphocytes Relative: 9 %
Lymphs Abs: 0.8 10*3/uL (ref 0.7–4.0)
MCH: 34 pg (ref 26.0–34.0)
MCHC: 33.6 g/dL (ref 30.0–36.0)
MCV: 101.3 fL — ABNORMAL HIGH (ref 80.0–100.0)
Monocytes Absolute: 0.8 10*3/uL (ref 0.1–1.0)
Monocytes Relative: 9 %
Neutro Abs: 6.9 10*3/uL (ref 1.7–7.7)
Neutrophils Relative %: 78 %
Platelet Count: 278 10*3/uL (ref 150–400)
RBC: 3.94 MIL/uL (ref 3.87–5.11)
RDW: 12.9 % (ref 11.5–15.5)
WBC Count: 8.8 10*3/uL (ref 4.0–10.5)
nRBC: 0 % (ref 0.0–0.2)

## 2022-01-14 LAB — CMP (CANCER CENTER ONLY)
ALT: 7 U/L (ref 0–44)
AST: 14 U/L — ABNORMAL LOW (ref 15–41)
Albumin: 4.4 g/dL (ref 3.5–5.0)
Alkaline Phosphatase: 105 U/L (ref 38–126)
Anion gap: 8 (ref 5–15)
BUN: 25 mg/dL — ABNORMAL HIGH (ref 8–23)
CO2: 25 mmol/L (ref 22–32)
Calcium: 10.8 mg/dL — ABNORMAL HIGH (ref 8.9–10.3)
Chloride: 104 mmol/L (ref 98–111)
Creatinine: 1.39 mg/dL — ABNORMAL HIGH (ref 0.44–1.00)
GFR, Estimated: 41 mL/min — ABNORMAL LOW (ref >60)
Glucose, Bld: 97 mg/dL (ref 70–99)
Potassium: 4.2 mmol/L (ref 3.5–5.1)
Sodium: 137 mmol/L (ref 135–145)
Total Bilirubin: 0.3 mg/dL (ref 0.3–1.2)
Total Protein: 8 g/dL (ref 6.5–8.1)

## 2022-01-14 MED ORDER — DENOSUMAB 120 MG/1.7ML ~~LOC~~ SOLN
120.0000 mg | Freq: Once | SUBCUTANEOUS | Status: AC
  Administered 2022-01-14: 120 mg via SUBCUTANEOUS
  Filled 2022-01-14: qty 1.7

## 2022-01-14 MED ORDER — FULVESTRANT 250 MG/5ML IM SOSY
500.0000 mg | PREFILLED_SYRINGE | Freq: Once | INTRAMUSCULAR | Status: AC
  Administered 2022-01-14: 500 mg via INTRAMUSCULAR
  Filled 2022-01-14: qty 10

## 2022-01-14 NOTE — Assessment & Plan Note (Addendum)
Metastatic breast cancer (pericardial effusion, pleural effusion, right middle lobe lung lesion, ER 91%, PR 90%, Ki-67 35%, HER2 negative diagnosed February 2013)  (Prior history of right mastectomy 2005, adjuvant chemo with Adriamycin Cytoxan and Taxol followed by tamoxifen)  Current treatment: Ribociclib, fulvestrant, Xgeva,ribociclib discontinued 10/18/2021 switching to abemaciclib as soon as it becomes available because of progression of disease Foundation 1 in 2021: MSI stable, TMB 1, ESR 1 mutation, PTEN, FGF 10, RICTOR amplifications  Scans: liver MRI 10/14/2021: Multiple stable to slightly enlarged hepatic metastatic lesions suggesting minimal progression, unchanged thoracic and lumbar vertebral metastases   Recommendation: 1. abemaciclib at 100 mg p.o. twice daily starting 10/27/2021 2.monthly for Faslodex injections  Toxicities: Intermittent diarrhea and fatigue  Bone metastasis: On Xgeva: monthly at this time.  hold Xgeva for 3 months after dental work  Liver MRI 01/12/2022: Progression of metastatic disease in the liver with increased number and size of hepatic metastases, widespread metastatic disease to the bones, moderate pleural effusion  Radiology review: Based on evidence of progression on abemaciclib, I recommended that we obtain a sample of the pleural fluid or the liver to recheck the prognostic panel.  She is eligible for participation in [place] clinical trial This is a phase 3 clinical trial for patients were randomly assigned to [facility] with palbociclib and fulvestrant (if there was [id number] mutation randomization is between [employer] palbociclib fulvestrant versus alpelisib fulvestrant versus Gedatolisib fulvestrant  Guardant360 will be sent

## 2022-01-14 NOTE — Progress Notes (Signed)
Patient Care Team: [name], MD as PCP - General (Family Medicine) Jerline Pain, MD (Cardiology) Gaye Pollack, MD (Cardiothoracic Surgery) Raina Mina, RPH-CPP (Pharmacist) Renaldo Harrison, MD as Referring Physician (Radiation Oncology) Nicholas Lose, MD as Consulting Physician (Hematology and Oncology)  DIAGNOSIS:  Encounter Diagnosis  Name Primary?   Malignant neoplasm of upper-outer quadrant of right breast in female, estrogen receptor positive ([place])     SUMMARY OF ONCOLOGIC HISTORY: Oncology History  Malignant neoplasm of upper-outer quadrant of right breast in female, estrogen receptor positive ([place])  03/22/2004 Initial Diagnosis   right mastectomy and sentinel lymph node sampling 03/22/2004 for a right upper-outer quadrant pT2 pN1, stage IIB invasive ductal carcinoma with lobular features, grade 2, estrogen receptor and progesterone receptor positive, HER-2 negative, with an MIB-1 of 9%   05/2004 - 09/18/2004 Chemotherapy   adjuvant chemotherapy with dose dense doxorubicin and cyclophosphamide x4 cycles (first cycle delayed one week) followed by dose dense paclitaxel x4 was completed 09/18/2004   09/2004 - 2009 Anti-estrogen oral therapy   tamoxifen started March 2006, discontinued 2009   08/2011 Relapse/Recurrence   Metastatic disease:large pericardial effusion, large right pleural effusion and possible right middle lobe bronchial obstruction  status post pericardial window placement, fiberoptic bronchoscopy and right Pleurx placement 09/11/2011, with biopsy of the bronchus intermedius and pericardium positive for metastatic breast cancer, ER 91%, PR 100% p with an MIB-1 of 35%, and no HER-2 amplification, ratio being 1.37   10/10/2011 - 07/2019 Anti-estrogen oral therapy   BOLERO-4 trial 10/10/2011, receiving letrozole and everolimus (stopped for progression)   08/16/2019 Miscellaneous   foundation 1 requested on liver biopsy from 08/16/2019 shows a stable  microsatellite status with 1 mutation/MB, amplification of C11orf30, FGF10 and RICTOR, with mutations in ESR1 and PTEN   08/24/2019 -  Anti-estrogen oral therapy   fulvestrant started 08/24/2019, repeated every 4 weeks Palbociclib 125 mg daily 21 days on 7 days off started 08/25/2019 Switched to ribociclib s starting 08/22/2020   02/28/2021 -  Radiation Therapy   palliative radiation (Dr. Lynnette Caffey in [place]) 02/28/2021 to right hip area     CHIEF COMPLIANT: Follow-up to discuss recently performed liver MRI  INTERVAL HISTORY: Barbara Parks is a 71 year old above-mentioned with metastatic breast cancer who was switched from ribociclib to abemaciclib and is here to review the results of her liver MRI.  She had tolerated abemaciclib fairly well.  Her scans showed progression of liver metastases.  She does complain of pain in the right hip as well as the lower back area.   ALLERGIES:  is allergic to aspirin, latex, and nsaids.  MEDICATIONS:  Current Outpatient Medications  Medication Sig Dispense Refill   abemaciclib (VERZENIO) 100 MG tablet Take 1 tablet (100 mg total) by mouth 2 (two) times daily. Swallow tablets whole. Do not chew, crush, or split tablets before swallowing. 60 tablet 3   cholecalciferol (VITAMIN D3) 25 MCG (1000 UT) tablet Take 2 tablets (2,000 Units total) by mouth daily. (Patient not taking: Reported on 12/31/2021) 180 tablet 4   gemfibrozil (LOPID) 600 MG tablet Take 1 tablet (600 mg total) by mouth 2 (two) times daily before a meal. 180 tablet 2   loperamide (IMODIUM) 2 MG capsule Take 2 mg by mouth 4 (four) times daily as needed for diarrhea or loose stools. Reported on 11/30/2015     metFORMIN (GLUCOPHAGE-XR) 500 MG 24 hr tablet Take 1,500 mg by mouth daily with supper. Per patient, 1500 mg daily with supper.  Followed by [facility]  3   ondansetron (ZOFRAN) 8 MG tablet Take 1 tablet (8 mg total) by mouth every 8 (eight) hours as needed for nausea or vomiting. 30 tablet  2   prochlorperazine (COMPAZINE) 10 MG tablet Take 1 tablet (10 mg total) by mouth every 8 (eight) hours as needed for nausea or vomiting. 30 tablet 2   No current facility-administered medications for this visit.    PHYSICAL EXAMINATION: ECOG PERFORMANCE STATUS: 1 - Symptomatic but completely ambulatory  Vitals:   01/14/22 1155  BP: 138/75  Pulse: (!) 102  Resp: 18  Temp: (!) 97.2 F (36.2 C)  SpO2: 100%   Filed Weights   01/14/22 1155  Weight: 152 lb 3.2 oz (69 kg)      LABORATORY DATA:  I have reviewed the data as listed    Latest Ref Rng & Units 01/14/2022   11:45 AM 12/31/2021    1:44 PM 12/17/2021    1:56 PM  CMP  Glucose 70 - 99 mg/dL 97  92  110   BUN 8 - 23 mg/dL 25  26  26    Creatinine 0.44 - 1.00 mg/dL 1.39  1.39  1.45   Sodium 135 - 145 mmol/L 137  136  136   Potassium 3.5 - 5.1 mmol/L 4.2  4.3  4.5   Chloride 98 - 111 mmol/L 104  105  107   CO2 22 - 32 mmol/L 25  23  21    Calcium 8.9 - 10.3 mg/dL 10.8  10.5  9.9   Total Protein 6.5 - 8.1 g/dL 8.0  7.7  7.9   Total Bilirubin 0.3 - 1.2 mg/dL 0.3  0.3  0.3   Alkaline Phos 38 - 126 U/L 105  100  91   AST 15 - 41 U/L 14  13  15    ALT 0 - 44 U/L 7  7  8      Lab Results  Component Value Date   WBC 8.8 01/14/2022   HGB 13.4 01/14/2022   HCT 39.9 01/14/2022   MCV 101.3 (H) 01/14/2022   PLT 278 01/14/2022   NEUTROABS 6.9 01/14/2022    ASSESSMENT & PLAN:  Malignant neoplasm of upper-outer quadrant of right breast in female, estrogen receptor positive ([facility]) Metastatic breast cancer (pericardial effusion, pleural effusion, right middle lobe lung lesion, ER 91%, PR 90%, Ki-67 35%, HER2 negative diagnosed February 2013)   (Prior history of right mastectomy 2005, adjuvant chemo with Adriamycin Cytoxan and Taxol followed by tamoxifen)   Current treatment: Ribociclib, fulvestrant, Xgeva, ribociclib discontinued 10/18/2021 switching to abemaciclib as soon as it becomes available because of progression of disease   Foundation 1 in 2021: MSI stable, TMB 1, ESR 1 mutation, PTEN, FGF 10, RICTOR amplifications   Scans: liver MRI 10/14/2021: Multiple stable to slightly enlarged hepatic metastatic lesions suggesting minimal progression, unchanged thoracic and lumbar vertebral metastases     Recommendation: 1.  abemaciclib at 100 mg p.o. twice daily starting 10/27/2021 2. monthly for Faslodex injections   Toxicities: Intermittent diarrhea and fatigue   Bone metastasis: On Xgeva: We will resume it every 3 months.  She did not do her dental stuff because she tells me that she does not need it.     Liver MRI 01/12/2022: Progression of metastatic disease in the liver with increased number and size of hepatic metastases, widespread metastatic disease to the bones, moderate pleural effusion  Radiology review: Based on evidence of progression on abemaciclib, I recommended that we  obtain Guardant360 to assess if she has any actionable mutations.  I discussed with her about up alpelisib if she has a PIK3CA mutation as well as Enhertu since previously she was HER2 2+ positive.  We also discussed Xeloda.  We also discussed participating in [place] clinical trial but previously she progressed on Ibrance and therefore she does not want to do that study. This is a phase 3 clinical trial for patients were randomly assigned to [facility] with palbociclib and fulvestrant (if there was PIK3CA mutation randomization is between [employer] palbociclib fulvestrant versus alpelisib fulvestrant versus Gedatolisib fulvestrant  Guardant360 will be sent Telephone visit in 2 weeks to discuss the results of the bone scan as well as Guardant360.   Orders Placed This Encounter  Procedures   NM Bone Scan Whole Body    Standing Status:   Future    Standing Expiration Date:   01/14/2023    Order Specific Question:   If indicated for the ordered procedure, I authorize the administration of a radiopharmaceutical per Radiology protocol     Answer:   Yes    Order Specific Question:   Preferred imaging location?    Answer:   [facility]    Order Specific Question:   Release to patient    Answer:   Immediate   Guardant 360    Standing Status:   Future    Standing Expiration Date:   01/14/2023   The patient has a good understanding of the overall plan. she agrees with it. she will call with any problems that may develop before the next visit here. Total time spent: 30 mins including face to face time and time spent for planning, charting and co-ordination of care   Harriette Ohara, MD 01/14/22

## 2022-01-15 LAB — CANCER ANTIGEN 27.29: CA 27.29: 784.7 U/mL — ABNORMAL HIGH (ref 0.0–38.6)

## 2022-01-17 NOTE — Progress Notes (Signed)
HEMATOLOGY-ONCOLOGY TELEPHONE VISIT PROGRESS NOTE  I connected with [name] on 01/31/22 at  8:15 AM EDT by telephone and verified that I am speaking with the correct person using two identifiers.  I discussed the limitations, risks, security and privacy concerns of performing an evaluation and management service by telephone and the availability of in person appointments.  I also discussed with the patient that there may be a patient responsible charge related to this service. The patient expressed understanding and agreed to proceed.   History of Present Illness: Barbara Parks is a 71 year old above-mentioned with metastatic breast cancer who was switched from ribociclib to abemaciclib. She presents to the clinic today via telephone follow-up to discuss the results of Guardant 360.  Oncology History  Malignant neoplasm of upper-outer quadrant of right breast in female, estrogen receptor positive (Barbara Parks)  03/22/2004 Initial Diagnosis   right mastectomy and sentinel lymph node sampling 03/22/2004 for a right upper-outer quadrant pT2 pN1, stage IIB invasive ductal carcinoma with lobular features, grade 2, estrogen receptor and progesterone receptor positive, HER-2 negative, with an MIB-1 of 9%   05/2004 - 09/18/2004 Chemotherapy   adjuvant chemotherapy with dose dense doxorubicin and cyclophosphamide x4 cycles (first cycle delayed one week) followed by dose dense paclitaxel x4 was completed 09/18/2004   09/2004 - 2009 Anti-estrogen oral therapy   tamoxifen started March 2006, discontinued 2009   08/2011 Relapse/Recurrence   Metastatic disease:large pericardial effusion, large right pleural effusion and possible right middle lobe bronchial obstruction  status post pericardial window placement, fiberoptic bronchoscopy and right Pleurx placement 09/11/2011, with biopsy of the bronchus intermedius and pericardium positive for metastatic breast cancer, ER 91%, PR 100% p with an MIB-1 of 35%, and no HER-2  amplification, ratio being 1.37   10/10/2011 - 07/2019 Anti-estrogen oral therapy   BOLERO-4 trial 10/10/2011, receiving letrozole and everolimus (stopped for progression)   08/16/2019 Miscellaneous   foundation 1 requested on liver biopsy from 08/16/2019 shows a stable microsatellite status with 1 mutation/MB, amplification of C11orf30, FGF10 and RICTOR, with mutations in ESR1 and PTEN   08/24/2019 -  Anti-estrogen oral therapy   fulvestrant started 08/24/2019, repeated every 4 weeks Palbociclib 125 mg daily 21 days on 7 days off started 08/25/2019 Switched to ribociclib s starting 08/22/2020   02/28/2021 -  Radiation Therapy   palliative radiation (Dr. Lynnette Caffey in [place]) 02/28/2021 to right hip area   01/14/2022 Miscellaneous   Guardant360: ESR 1 mutation (no other mutations) MSI high was not detected, PTEN, T p53, EGFR amplification, RB1 mutations detected without any approved treatment options     REVIEW OF SYSTEMS:   Constitutional: Denies fevers, chills or abnormal weight loss   All other systems were reviewed with the patient and are negative. Observations/Objective:     Assessment Plan:  Malignant neoplasm of upper-outer quadrant of right breast in female, estrogen receptor positive (Barbara Parks) Metastatic breast cancer (pericardial effusion, pleural effusion, right middle lobe lung lesion, ER 91%, PR 90%, Ki-67 35%, HER2 negative diagnosed February 2013)   (Prior history of right mastectomy 2005, adjuvant chemo with Adriamycin Cytoxan and Taxol followed by tamoxifen)   Current treatment:  1. Ribociclib, fulvestrant, Xgeva, ribociclib discontinued 10/18/2021  2. abemaciclib started 10/27/2021 Foundation 1 in 2021: MSI stable, TMB 1, ESR 1 mutation, PTEN, FGF 10, RICTOR amplifications   Scans:  Liver MRI 01/12/2022: Progression of metastatic disease in the liver with increased number and size of hepatic metastases, widespread metastatic disease to the bones, moderate pleural  effusion  Guardant360  01/14/2022: No actionable mutations.  ESR 1 mutation.  Discussion: We will bring her in tomorrow to discuss treatment options.    I discussed the assessment and treatment plan with the patient. The patient was provided an opportunity to ask questions and all were answered. The patient agreed with the plan and demonstrated an understanding of the instructions. The patient was advised to call back or seek an in-person evaluation if the symptoms worsen or if the condition fails to improve as anticipated.   I provided 12 minutes of non-face-to-face time during this encounter. Harriette Ohara, MD   I Gardiner Coins am scribing for Dr. Lindi Adie  I have reviewed the above documentation for accuracy and completeness, and I agree with the above.

## 2022-01-23 ENCOUNTER — Encounter (HOSPITAL_COMMUNITY)
Admission: RE | Admit: 2022-01-23 | Discharge: 2022-01-23 | Disposition: A | Discharge status: Home / Self Care | Payer: Medicare Other | Source: Ambulatory Visit | Attending: Hematology and Oncology | Admitting: Hematology and Oncology

## 2022-01-23 DIAGNOSIS — Z170 Estrogen receptor positive status [ER+]: Secondary | ICD-10-CM | POA: Diagnosis not present

## 2022-01-23 DIAGNOSIS — C7951 Secondary malignant neoplasm of bone: Secondary | ICD-10-CM | POA: Diagnosis not present

## 2022-01-23 DIAGNOSIS — C50919 Malignant neoplasm of unspecified site of unspecified female breast: Secondary | ICD-10-CM | POA: Diagnosis not present

## 2022-01-23 DIAGNOSIS — C50411 Malignant neoplasm of upper-outer quadrant of right female breast: Secondary | ICD-10-CM | POA: Diagnosis not present

## 2022-01-23 LAB — GUARDANT 360

## 2022-01-23 MED ORDER — TECHNETIUM TC 99M MEDRONATE IV KIT
20.0000 | PACK | Freq: Once | INTRAVENOUS | Status: AC | PRN
  Administered 2022-01-23: 19.7 via INTRAVENOUS

## 2022-01-29 ENCOUNTER — Other Ambulatory Visit: Payer: Self-pay | Admitting: Hematology and Oncology

## 2022-01-30 NOTE — Assessment & Plan Note (Signed)
Metastatic breast cancer (pericardial effusion, pleural effusion, right middle lobe lung lesion, ER 91%, PR 90%, Ki-67 35%, HER2 negative diagnosed February 2013)  (Prior history of right mastectomy 2005, adjuvant chemo with Adriamycin Cytoxan and Taxol followed by tamoxifen)  Current treatment: Ribociclib, fulvestrant, Xgeva,ribociclib discontinued 10/18/2021 switching to abemaciclib as soon as it becomes available because of progression of disease Foundation 1 in 2021: MSI stable, TMB 1, ESR 1 mutation, PTEN, FGF 10, RICTOR amplifications  Scans: liver MRI 10/14/2021: Multiple stable to slightly enlarged hepatic metastatic lesions suggesting minimal progression, unchanged thoracic and lumbar vertebral metastases   Recommendation: 1.abemaciclib at 100 mg p.o. twice daily starting 10/27/2021 2.monthly for Faslodex injections  Toxicities: Intermittent diarrhea and fatigue  Bone metastasis: On Xgeva: We will resume it every 3 months.  She did not do her dental stuff because she tells me that she does not need it.    Liver MRI 01/12/2022: Progression of metastatic disease in the liver with increased number and size of hepatic metastases, widespread metastatic disease to the bones, moderate pleural effusion

## 2022-01-31 ENCOUNTER — Inpatient Hospital Stay: Payer: Medicare Other | Attending: Oncology | Admitting: Hematology and Oncology

## 2022-01-31 DIAGNOSIS — Z170 Estrogen receptor positive status [ER+]: Secondary | ICD-10-CM | POA: Insufficient documentation

## 2022-01-31 DIAGNOSIS — C787 Secondary malignant neoplasm of liver and intrahepatic bile duct: Secondary | ICD-10-CM | POA: Insufficient documentation

## 2022-01-31 DIAGNOSIS — C7951 Secondary malignant neoplasm of bone: Secondary | ICD-10-CM | POA: Insufficient documentation

## 2022-01-31 DIAGNOSIS — Z5111 Encounter for antineoplastic chemotherapy: Secondary | ICD-10-CM | POA: Insufficient documentation

## 2022-01-31 DIAGNOSIS — C50411 Malignant neoplasm of upper-outer quadrant of right female breast: Secondary | ICD-10-CM | POA: Diagnosis not present

## 2022-01-31 DIAGNOSIS — J90 Pleural effusion, not elsewhere classified: Secondary | ICD-10-CM | POA: Insufficient documentation

## 2022-01-31 DIAGNOSIS — Z79899 Other long term (current) drug therapy: Secondary | ICD-10-CM | POA: Insufficient documentation

## 2022-02-01 ENCOUNTER — Inpatient Hospital Stay (HOSPITAL_BASED_OUTPATIENT_CLINIC_OR_DEPARTMENT_OTHER): Payer: Medicare Other | Admitting: Hematology and Oncology

## 2022-02-01 ENCOUNTER — Other Ambulatory Visit: Payer: Self-pay

## 2022-02-01 VITALS — BP 106/70 | HR 117 | Temp 98.8°F | Resp 18 | Ht 64.0 in | Wt 151.7 lb

## 2022-02-01 DIAGNOSIS — C50411 Malignant neoplasm of upper-outer quadrant of right female breast: Secondary | ICD-10-CM

## 2022-02-01 DIAGNOSIS — Z79899 Other long term (current) drug therapy: Secondary | ICD-10-CM | POA: Diagnosis not present

## 2022-02-01 DIAGNOSIS — Z170 Estrogen receptor positive status [ER+]: Secondary | ICD-10-CM

## 2022-02-01 DIAGNOSIS — C787 Secondary malignant neoplasm of liver and intrahepatic bile duct: Secondary | ICD-10-CM | POA: Diagnosis not present

## 2022-02-01 DIAGNOSIS — C7951 Secondary malignant neoplasm of bone: Secondary | ICD-10-CM

## 2022-02-01 DIAGNOSIS — Z5111 Encounter for antineoplastic chemotherapy: Secondary | ICD-10-CM | POA: Diagnosis not present

## 2022-02-01 DIAGNOSIS — J90 Pleural effusion, not elsewhere classified: Secondary | ICD-10-CM | POA: Diagnosis not present

## 2022-02-01 NOTE — Progress Notes (Signed)
Patient Care Team: [name], MD as PCP - General (Family Medicine) Jerline Pain, MD (Cardiology) Gaye Pollack, MD (Cardiothoracic Surgery) Raina Mina, RPH-CPP (Pharmacist) Renaldo Harrison, MD as Referring Physician (Radiation Oncology) Nicholas Lose, MD as Consulting Physician (Hematology and Oncology)  DIAGNOSIS: No diagnosis found.  SUMMARY OF ONCOLOGIC HISTORY: Oncology History  Malignant neoplasm of upper-outer quadrant of right breast in female, estrogen receptor positive ([place])  03/22/2004 Initial Diagnosis   right mastectomy and sentinel lymph node sampling 03/22/2004 for a right upper-outer quadrant pT2 pN1, stage IIB invasive ductal carcinoma with lobular features, grade 2, estrogen receptor and progesterone receptor positive, HER-2 negative, with an MIB-1 of 9%   05/2004 - 09/18/2004 Chemotherapy   adjuvant chemotherapy with dose dense doxorubicin and cyclophosphamide x4 cycles (first cycle delayed one week) followed by dose dense paclitaxel x4 was completed 09/18/2004   09/2004 - 2009 Anti-estrogen oral therapy   tamoxifen started March 2006, discontinued 2009   08/2011 Relapse/Recurrence   Metastatic disease:large pericardial effusion, large right pleural effusion and possible right middle lobe bronchial obstruction  status post pericardial window placement, fiberoptic bronchoscopy and right Pleurx placement 09/11/2011, with biopsy of the bronchus intermedius and pericardium positive for metastatic breast cancer, ER 91%, PR 100% p with an MIB-1 of 35%, and no HER-2 amplification, ratio being 1.37   10/10/2011 - 07/2019 Anti-estrogen oral therapy   BOLERO-4 trial 10/10/2011, receiving letrozole and everolimus (stopped for progression)   08/16/2019 Miscellaneous   foundation 1 requested on liver biopsy from 08/16/2019 shows a stable microsatellite status with 1 mutation/MB, amplification of C11orf30, FGF10 and RICTOR, with mutations in ESR1 and PTEN   08/24/2019 -   Anti-estrogen oral therapy   fulvestrant started 08/24/2019, repeated every 4 weeks Palbociclib 125 mg daily 21 days on 7 days off started 08/25/2019 Switched to ribociclib s starting 08/22/2020   02/28/2021 -  Radiation Therapy   palliative radiation (Dr. Lynnette Caffey in [place]) 02/28/2021 to right hip area   01/14/2022 Miscellaneous   Guardant360: ESR 1 mutation (no other mutations) MSI high was not detected, PTEN, T p53, EGFR amplification, RB1 mutations detected without any approved treatment options     CHIEF COMPLIANT: Follow up after Mri to discuss treatment option.  INTERVAL HISTORY: Barbara Parks is a 71 year old above-mentioned with metastatic breast cancer who was switched from ribociclib to abemaciclib. She presents to the clinic today for a follow-up to discuss treatment options.   ALLERGIES:  is allergic to aspirin, latex, and nsaids.  MEDICATIONS:  Current Outpatient Medications  Medication Sig Dispense Refill   cholecalciferol (VITAMIN D3) 25 MCG (1000 UT) tablet Take 2 tablets (2,000 Units total) by mouth daily. (Patient not taking: Reported on 12/31/2021) 180 tablet 4   gemfibrozil (LOPID) 600 MG tablet Take 1 tablet (600 mg total) by mouth 2 (two) times daily before a meal. 180 tablet 2   loperamide (IMODIUM) 2 MG capsule Take 2 mg by mouth 4 (four) times daily as needed for diarrhea or loose stools. Reported on 11/30/2015     metFORMIN (GLUCOPHAGE-XR) 500 MG 24 hr tablet Take 1,500 mg by mouth daily with supper. Per patient, 1500 mg daily with supper. Followed by [facility]  3   ondansetron (ZOFRAN) 8 MG tablet Take 1 tablet (8 mg total) by mouth every 8 (eight) hours as needed for nausea or vomiting. 30 tablet 2   prochlorperazine (COMPAZINE) 10 MG tablet Take 1 tablet (10 mg total) by mouth every 8 (eight) hours as  needed for nausea or vomiting. 30 tablet 2   VERZENIO 100 MG tablet TAKE 1 TABLET BY MOUTH TWICE DAILY 56 tablet 0   No current facility-administered  medications for this visit.    PHYSICAL EXAMINATION: ECOG PERFORMANCE STATUS: {CHL ONC ECOG [phone]}  There were no vitals filed for this visit. There were no vitals filed for this visit.  BREAST:*** No palpable masses or nodules in either right or left breasts. No palpable axillary supraclavicular or infraclavicular adenopathy no breast tenderness or nipple discharge. (exam performed in the presence of a chaperone)  LABORATORY DATA:  I have reviewed the data as listed    Latest Ref Rng & Units 01/14/2022   11:45 AM 12/31/2021    1:44 PM 12/17/2021    1:56 PM  CMP  Glucose 70 - 99 mg/dL 97  92  110   BUN 8 - 23 mg/dL _0 Creatinine 0.44 - 1.00 mg/dL 1.39  1.39  1.45   Sodium 135 - 145 mmol/L 137  136  136   Potassium 3.5 - 5.1 mmol/L 4.2  4.3  4.5   Chloride 98 - 111 mmol/L 104  105  107   CO2 22 - 32 mmol/L _1 Calcium 8.9 - 10.3 mg/dL 10.8  10.5  9.9   Total Protein 6.5 - 8.1 g/dL 8.0  7.7  7.9   Total Bilirubin 0.3 - 1.2 mg/dL 0.3  0.3  0.3   Alkaline Phos 38 - 126 U/L 105  100  91   AST 15 - 41 U/L _2 ALT 0 - 44 U/L _3 Lab Results  Component Value Date   WBC 8.8 01/14/2022   HGB 13.4 01/14/2022   HCT 39.9 01/14/2022   MCV 101.3 (H) 01/14/2022   PLT 278 01/14/2022   NEUTROABS 6.9 01/14/2022    ASSESSMENT & PLAN:  No problem-specific Assessment & Plan notes found for this encounter.    No orders of the defined types were placed in this encounter.  The patient has a good understanding of the overall plan. she agrees with it. she will call with any problems that may develop before the next visit here. Total time spent: 30 mins including face to face time and time spent for planning, charting and co-ordination of care   Suzzette Righter, [place] 02/01/22    I Gardiner Coins am scribing for Dr. Lindi Adie  ***

## 2022-02-01 NOTE — Research (Signed)
Title: [facility]tor[facility]-1   Patient Barbara Parks was identified by Dr Lindi Adie as a potential candidate for the above listed study.  This Clinical Research Nurse met with Nash, [id number], on 02/01/22 in a manner and location that ensures patient privacy to discuss participation in the above listed research study.  Patient is Unaccompanied.  A copy of the informed consent document with embedded HIPAA language was provided to the patient.  Patient reads, speaks, and understands [place].   Patient was provided with the business card of this Nurse and encouraged to contact the research team with any questions.  Approximately 10 minutes were spent with the patient reviewing the informed consent documents.  Patient was provided the option of taking informed consent documents home to review and was encouraged to review at their convenience with their support network, including other care providers. Patient took the consent documents home to review.  Plan made for follow up next week to update her on progress of eligibility evaluation.  Marjie Skiff Merle Whitehorn, RN, BSN, [facility]  Her  [facility] Clinical Research Nurse [facility] Direct Dial [phone]  Pager [phone] 02/01/2022 1:47 PM

## 2022-02-02 ENCOUNTER — Encounter: Payer: Self-pay | Admitting: Hematology and Oncology

## 2022-02-02 NOTE — Assessment & Plan Note (Signed)
Metastatic breast cancer (pericardial effusion, pleural effusion, right middle lobe lung lesion, ER 91%, PR 90%, Ki-67 35%, HER2 negative diagnosed February 2013)  (Prior history of right mastectomy 2005, adjuvant chemo with Adriamycin Cytoxan and Taxol followed by tamoxifen)  Current treatment:  1. Ribociclib, fulvestrant, Xgeva,ribociclib discontinued 10/18/2021  2. abemaciclib started 10/27/2021 Foundation 1 in 2021: MSI stable, TMB 1, ESR 1 mutation, PTEN, FGF 10, RICTOR amplifications  Scans:  Liver MRI 01/12/2022: Progression of metastatic disease in the liver with increased number and size of hepatic metastases, widespread metastatic disease to the bones, moderate pleural effusion  Guardant360 01/14/2022: No actionable mutations.  ESR 1 mutation.  Discussion: Treatment Options discussed 1. [place] clinical trial 2. Xeloda 3. Enhertu  Also recommended a biopsy of the liver for molecular testing including testing for PIK3CA mutation  

## 2022-02-05 IMAGING — CT CT CHEST W/ CM
2 of 4 series · 15 of 36 positions shown, 18 images · IV contrast (OMNIPAQUE)
Comparison: 04/25/2020 chest CT.

CLINICAL DATA: Metastatic right breast cancer with history of right
mastectomy and ongoing chemotherapy. Restaging.

EXAM:
CT CHEST WITH CONTRAST
TECHNIQUE: Multidetector CT imaging of the chest was performed during
intravenous contrast administration.
CONTRAST:  60mL OMNIPAQUE IOHEXOL 300 MG/ML  SOLN

[Series 2: axial st · axial · 0.75mm/px · z∈[-327,-41]mm · 12 of 169 slices shown, 15 images]
[im 13/169  mediastinal]
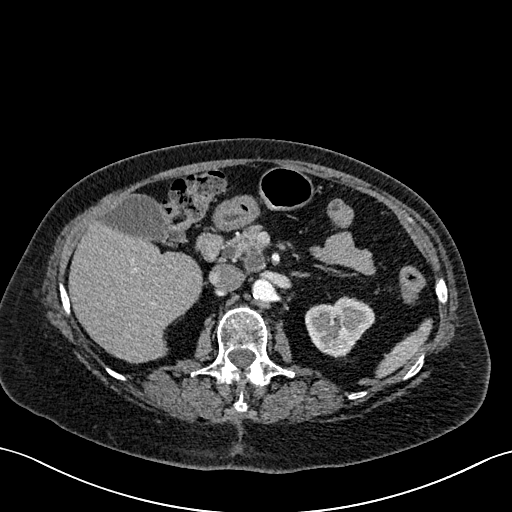
[im 13/169  lung]
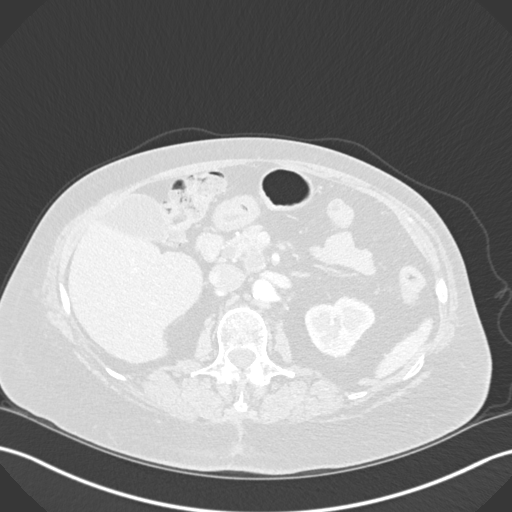
[im 26/169  lung]
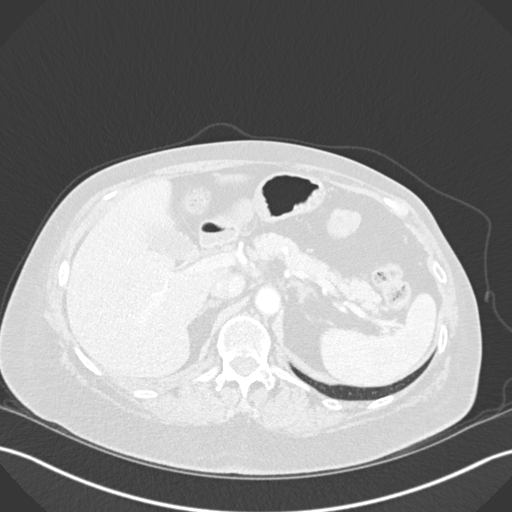
[im 39/169  lung]
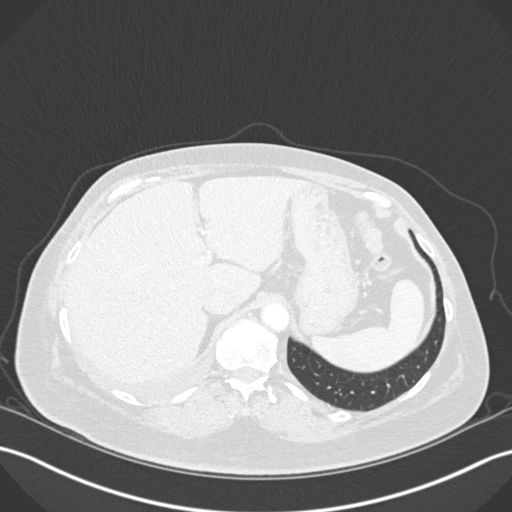
[im 52/169  lung]
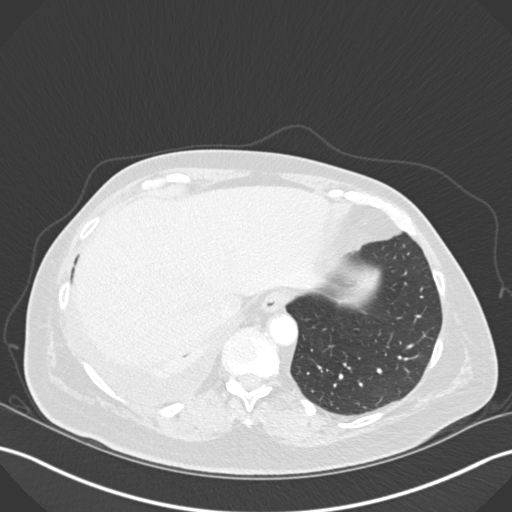
[im 65/169  mediastinal]
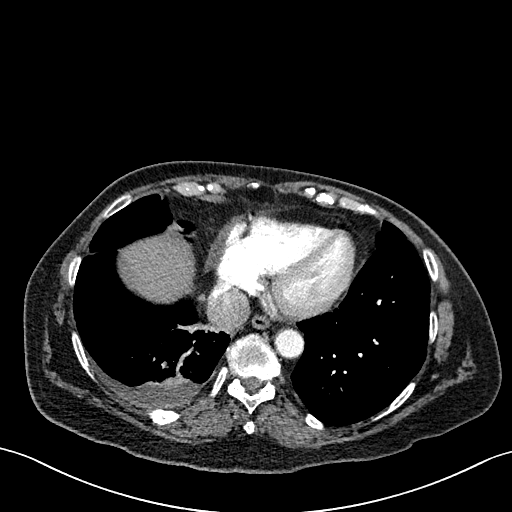
[im 65/169  lung]
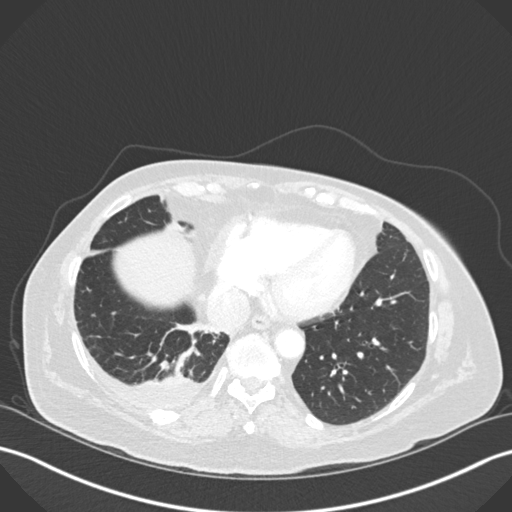
[im 78/169  lung]
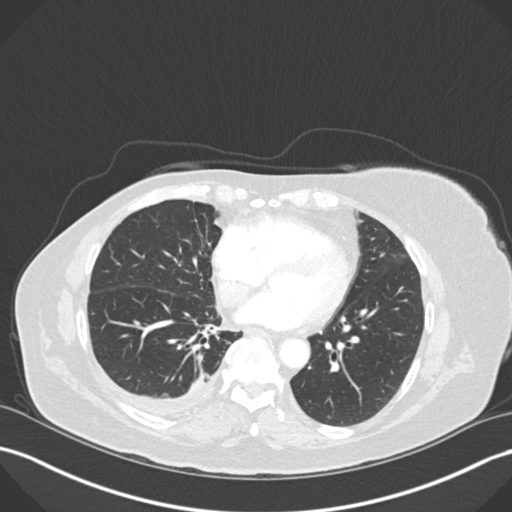
[im 91/169  lung]
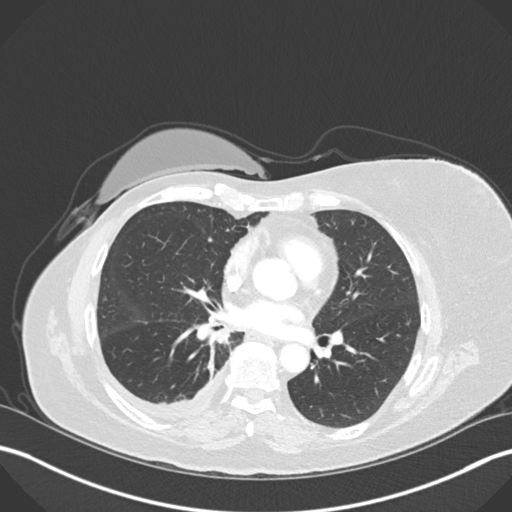
[im 104/169  lung]
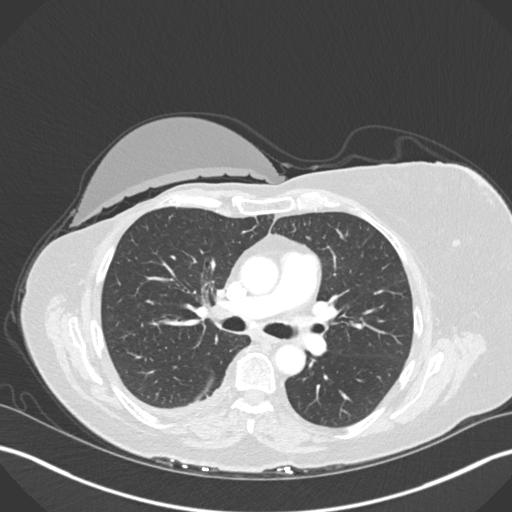
[im 117/169  mediastinal]
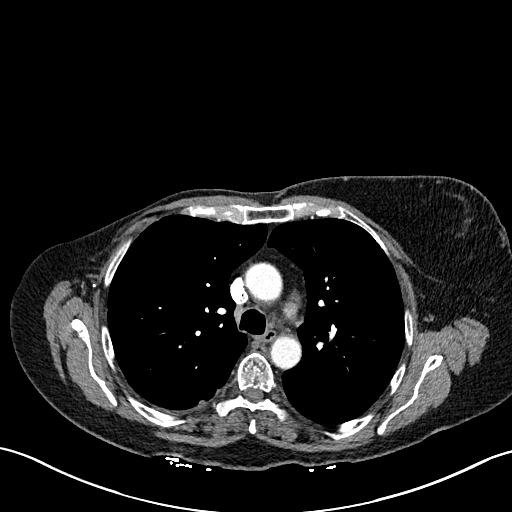
[im 117/169  lung]
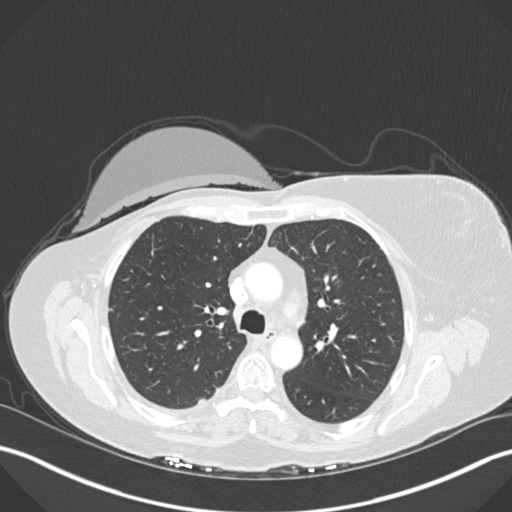
[im 130/169  lung]
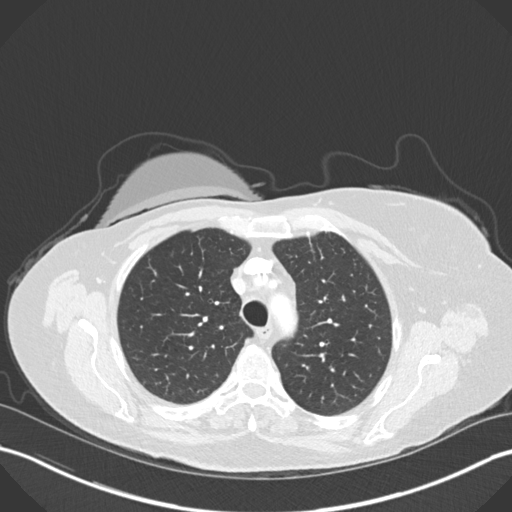
[im 143/169  lung]
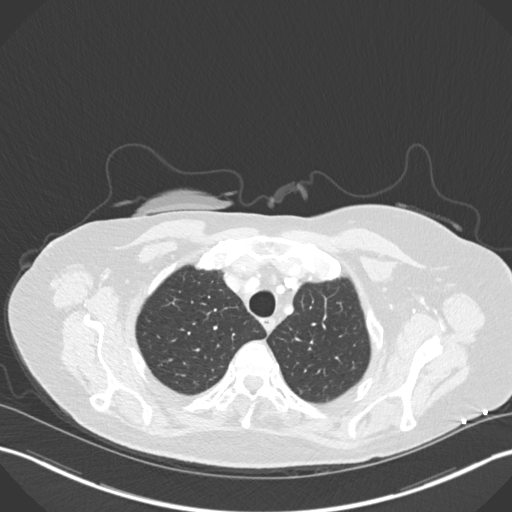
[im 156/169  lung]
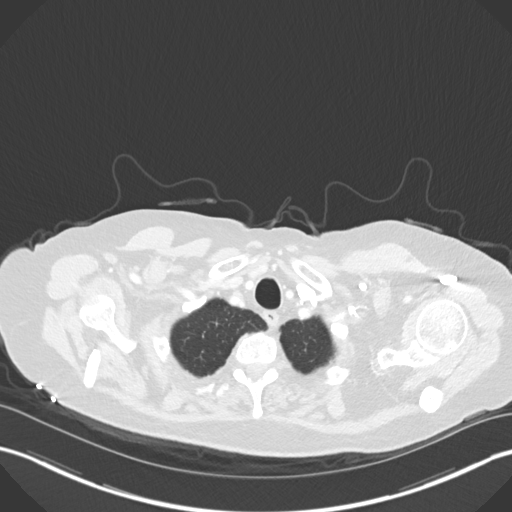

[Series 5: coronal · coronal · 0.70mm/px · 3 of 113 slices shown]
[im 23/113  lung]
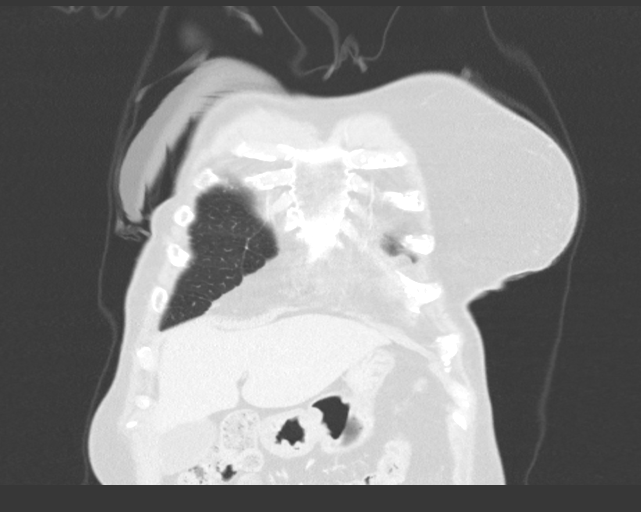
[im 45/113  lung]
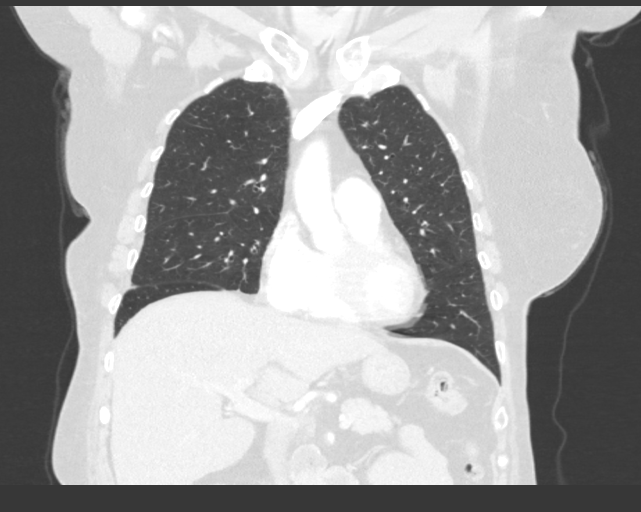
[im 68/113  lung]
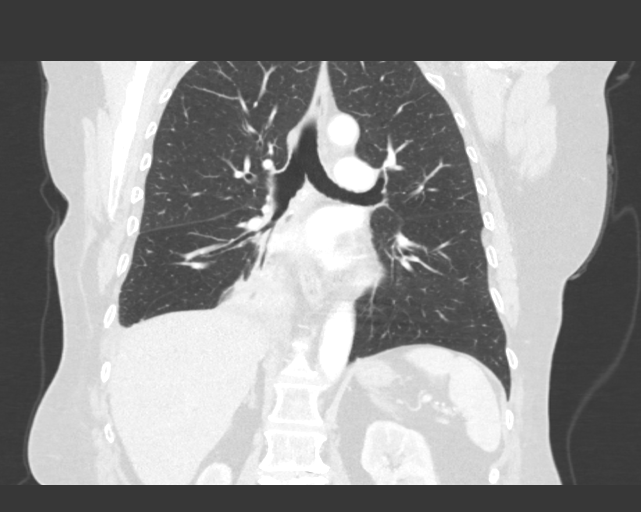

[15 of 36 positions shown; findings below may reference images not displayed]

FINDINGS: Cardiovascular: Normal heart size. No significant pericardial
effusion/thickening. Atherosclerotic nonaneurysmal thoracic aorta.
Normal caliber pulmonary arteries. No central pulmonary emboli.

Mediastinum/Nodes: No discrete thyroid nodules. Unremarkable
esophagus. No pathologically enlarged axillary, mediastinal or hilar
lymph nodes.

Lungs/Pleura: No pneumothorax. Small dependent right pleural
effusion is stable. No left pleural effusion. Central perihilar
right lower lobe 1.4 x 1.1 cm pulmonary nodule with extrinsic
narrowing of the right lower lobe bronchus (series 7/image 70),
previously 1.5 x 1.0 cm using similar measurement technique, not
appreciably changed. Stable platelike scarring versus atelectasis in
dependent right lower lobe. No acute consolidative airspace disease,
lung masses or new significant pulmonary nodules.

Upper abdomen: Stable 1.3 cm left adrenal nodule with density 38 HU,
previously characterized as an adenoma.

Musculoskeletal: Multiple sclerotic osseous lesions throughout the
thoracic skeleton, including the thoracic spine, bilateral ribs and
bilateral scapula, not appreciably changed in size or number,
largest in the T4 vertebral body and posterior right tenth rib.
Stable postsurgical changes from right mastectomy. Mild thoracic
spondylosis.
IMPRESSION: 1. No new or progressive metastatic disease in the chest.
2. Stable perihilar right lower lobe 1.4 cm pulmonary nodule with
extrinsic narrowing of the right lower lobe bronchus.
3. Stable small dependent right pleural effusion.
4. Stable sclerotic osseous metastases throughout the thoracic
skeleton as detailed.
5. Stable left adrenal adenoma.
6. Aortic Atherosclerosis ([id number]).

## 2022-02-05 NOTE — Progress Notes (Unsigned)
Yamagata, Glenn, MD  Arash Karstens D OK for US guided liver lesion biopsy.   GY  

## 2022-02-06 IMAGING — MR MR ABDOMEN WO/W CM
18 series · 48 of 48 positions shown · IV contrast (gadavist)
Comparison: MRI abdomen dated 02/01/2020

CLINICAL DATA: Metastatic breast cancer, Follow up liver metastases

EXAM:
MRI ABDOMEN WITHOUT AND WITH CONTRAST
TECHNIQUE: Multiplanar multisequence MR imaging of the abdomen was performed
both before and after the administration of intravenous contrast.
CONTRAST:  7mL GADAVIST GADOBUTROL 1 MMOL/ML IV SOLN

[Series 5: T2 · coronal · 6.0mm · 1.56mm/px · 2 of 32 slices shown (1 of 2)]
[im 1/32]
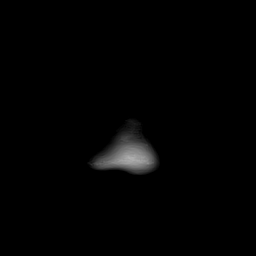
[im 32/32]
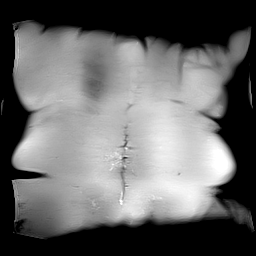

[Series 6: T2 fat-sat · axial · 6.0mm · 1.25mm/px · z∈[-118,+177]mm · 2 of 42 slices shown]
[im 1/42]
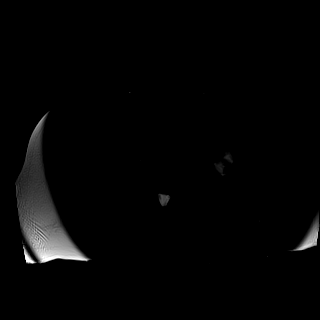
[im 42/42]
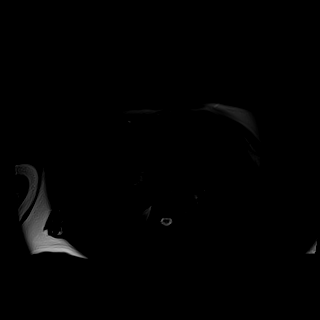

[Series 8: T1 · axial · 3.0mm · 1.25mm/px · z∈[-121,+140]mm · 4 of 88 slices shown (1 of 2)]
[im 1/88]
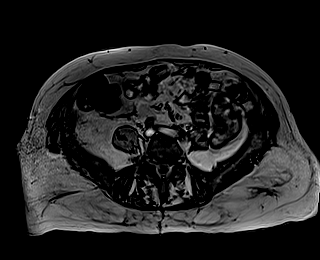
[im 30/88]
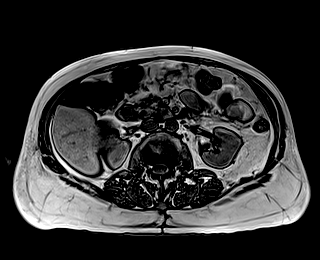
[im 59/88]
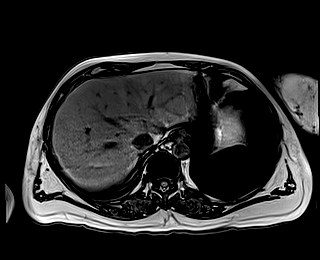
[im 88/88]
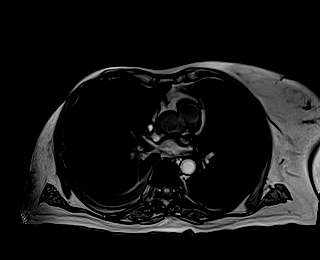

[Series 9: T1 · axial · 3.0mm · 1.25mm/px · z∈[-121,+140]mm · 4 of 88 slices shown (2 of 2)]
[im 1/88]
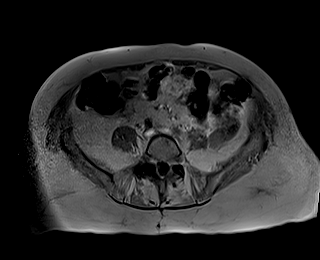
[im 30/88]
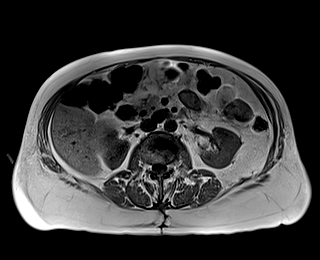
[im 59/88]
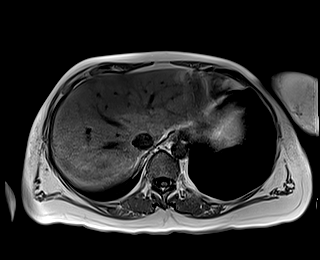
[im 88/88]
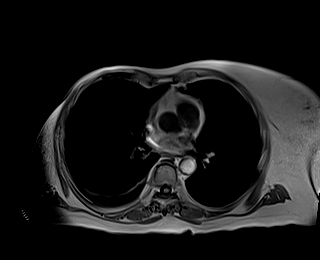

[Series 10: DWI · axial · 6.0mm · 1.49mm/px · z∈[-111,+170]mm · 3 of 80 slices shown (1 of 2)]
[im 1/80]
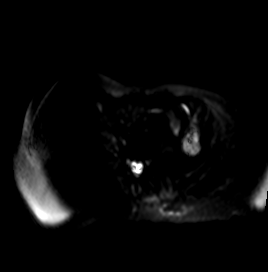
[im 40/80]
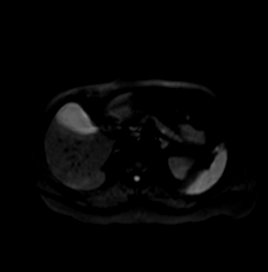
[im 80/80]
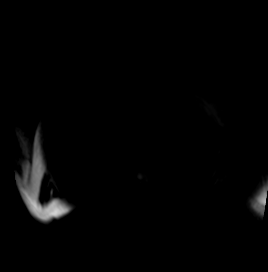

[Series 11: DWI · axial · 6.0mm · 1.49mm/px · 1 of 40 slices shown (2 of 2)]
[im 1/40]
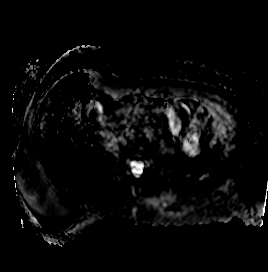

[Series 12: bSSFP · axial · 4.0mm · 0.84mm/px · z∈[-124,+144]mm · 2 of 68 slices shown]
[im 1/68]
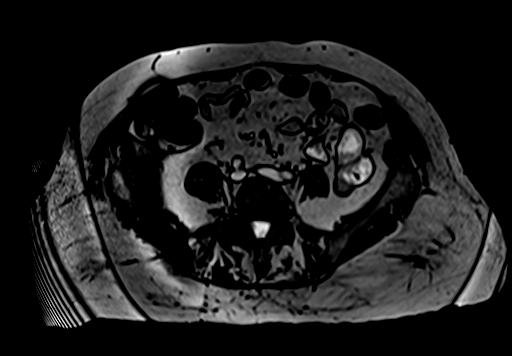
[im 68/68]
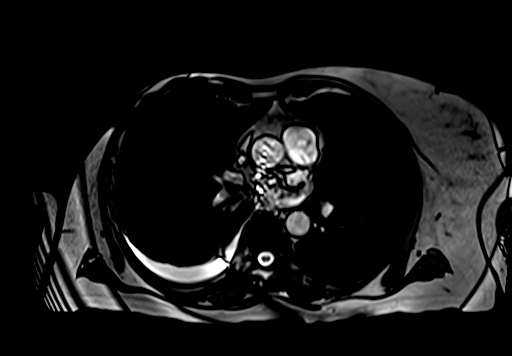

[Series 14: T1 dynamic · axial · 3.0mm · 1.25mm/px · z∈[-133,+152]mm · 3 of 96 slices shown (1 of 6)]
[im 1/96]
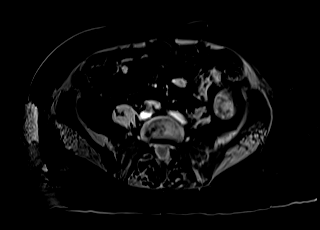
[im 48/96]
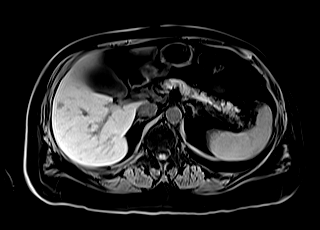
[im 96/96]
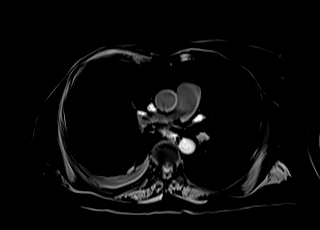

[Series 17: T1 dynamic · axial · 3.0mm · 1.25mm/px · z∈[-133,+152]mm · 3 of 96 slices shown (2 of 6)]
[im 1/96]
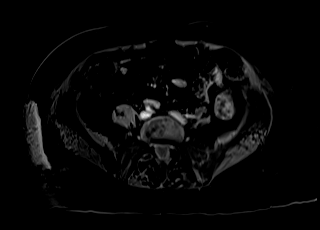
[im 48/96]
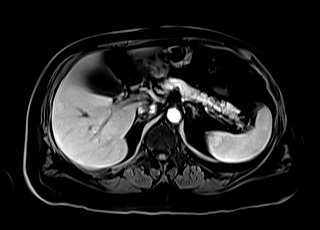
[im 96/96]
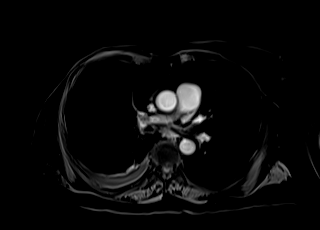

[Series 19: T1 dynamic · axial · 3.0mm · 1.25mm/px · z∈[-133,+152]mm · 3 of 96 slices shown (3 of 6)]
[im 1/96]
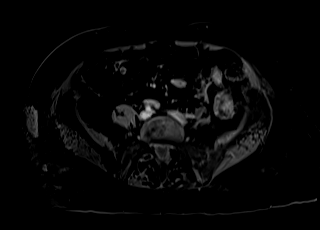
[im 48/96]
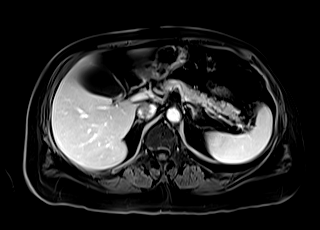
[im 96/96]
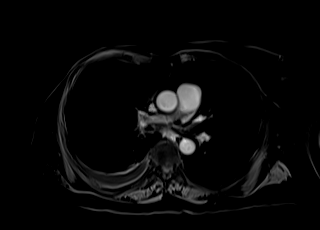

[Series 21: T1 dynamic · axial · 3.0mm · 1.25mm/px · z∈[-133,+152]mm · 3 of 96 slices shown (4 of 6)]
[im 1/96]
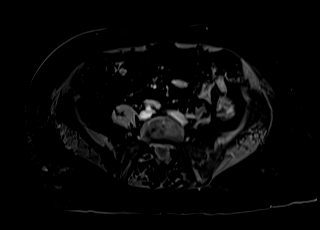
[im 48/96]
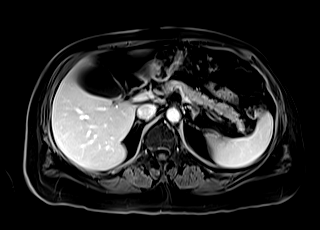
[im 96/96]
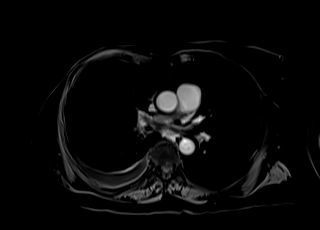

[Series 23: T1 dynamic · coronal · 4.0mm · 1.41mm/px · 2 of 60 slices shown (5 of 6)]
[im 1/60]
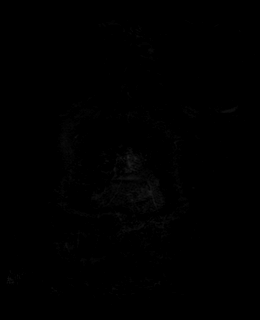
[im 60/60]
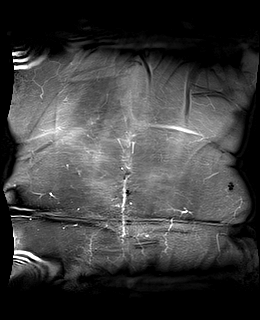

[Series 24: T2 · axial · 6.0mm · 1.56mm/px · 1 of 38 slices shown (2 of 2)]
[im 1/38]
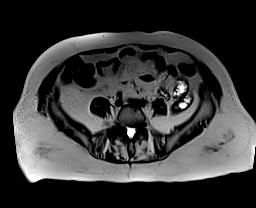

[Series 26: T1 dynamic · axial · 3.0mm · 1.25mm/px · z∈[-133,+152]mm · 3 of 96 slices shown (6 of 6)]
[im 1/96]
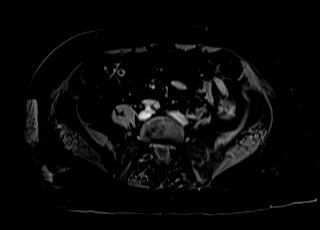
[im 48/96]
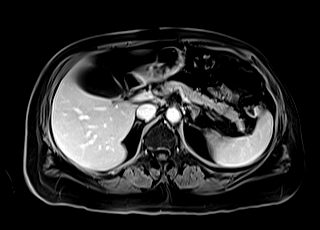
[im 96/96]
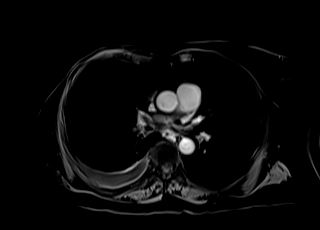

[Series 100: sub_arterial · axial · 3.0mm · 1.25mm/px · z∈[-133,+152]mm · 3 of 96 slices shown]
[im 1/96]
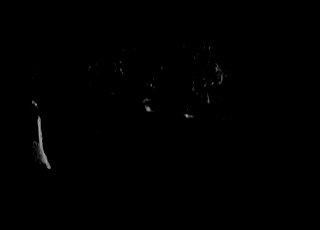
[im 48/96]
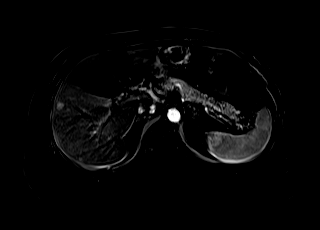
[im 96/96]
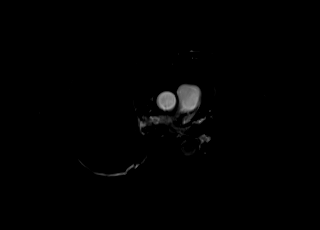

[Series 101: sub_(id) · axial · 3.0mm · 1.25mm/px · z∈[-133,+152]mm · 3 of 96 slices shown (1 of 2)]
[im 1/96]
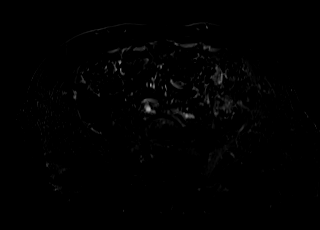
[im 48/96]
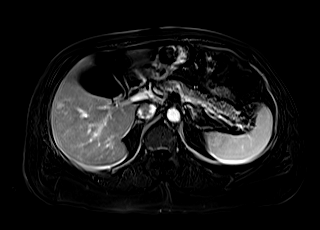
[im 96/96]
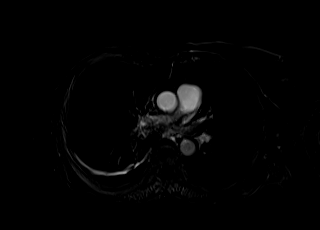

[Series 102: sub_(id) · axial · 3.0mm · 1.25mm/px · z∈[-133,+152]mm · 3 of 96 slices shown (2 of 2)]
[im 1/96]
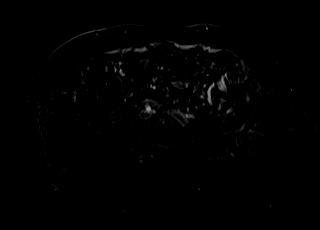
[im 48/96]
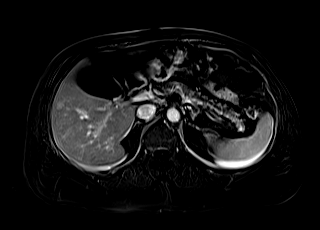
[im 96/96]
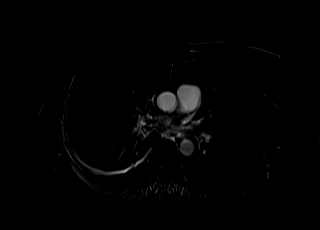

[Series 103: sub_delay · axial · 3.0mm · 1.25mm/px · z∈[-133,+152]mm · 3 of 96 slices shown]
[im 1/96]
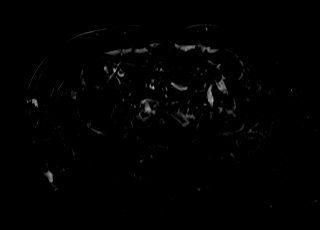
[im 48/96]
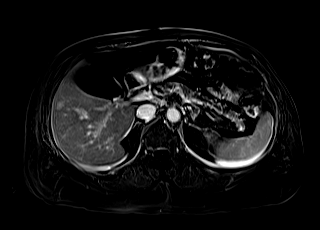
[im 96/96]
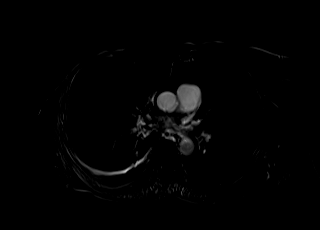

[48 of 48 positions shown; findings below may reference images not displayed]

FINDINGS: Lower chest: Small right pleural effusion with associated right
lower lobe opacity, unchanged.

Hepatobiliary: At least 6-7 small hepatic metastases are suspected,
best visualized on diffusion imaging (series 10/images 58, 59, and
62) and precontrast T1 imaging (series 14/images 41, 42, 47, 49, and
56). These are favored to be mildly progressive when compared to
prior MR. For example:

--12 mm lesion in segment 2 (series 14/image 42), previously 10 mm

--11 mm lesion in segment 5 (series 14/image 49), previously 7 mm

--9 mm lesion along the posterior aspect of segment 6 (series
14/image 56), previously 7 mm

Gallbladder is unremarkable. No intrahepatic or extrahepatic ductal
dilatation.

Pancreas:  Within normal limits.

Spleen:  Within normal limits.

Adrenals/Urinary Tract: 13 mm left adrenal adenoma, benign. Right
adrenal gland is within normal limits.

Kidneys are within normal limits.  No hydronephrosis.

Stomach/Bowel: Stomach is within normal limits.

Visualized bowel is unremarkable.

Vascular/Lymphatic:  No evidence of aneurysm.

No suspicious abdominal lymphadenopathy.

Other:  No abdominal ascites.

Musculoskeletal: Multifocal osseous metastases, including T10 and
T11 (series 5/image 22) and L3 (series 5/image 20), better evaluated
on recent CT and prior bone scan.
IMPRESSION: Multiple small hepatic metastases, as described above, favored to be
mildly progressive.

Multifocal osseous metastases, better evaluated on recent CT and
prior bone scan.

Small right pleural effusion with associated right lower lobe
opacity, unchanged.

## 2022-02-07 ENCOUNTER — Telehealth: Payer: Self-pay | Admitting: Hematology and Oncology

## 2022-02-11 ENCOUNTER — Inpatient Hospital Stay: Payer: Medicare Other

## 2022-02-11 ENCOUNTER — Ambulatory Visit: Payer: Medicare Other | Admitting: Pharmacist

## 2022-02-11 ENCOUNTER — Other Ambulatory Visit: Payer: Self-pay

## 2022-02-11 ENCOUNTER — Other Ambulatory Visit: Payer: Self-pay | Admitting: Hematology and Oncology

## 2022-02-11 VITALS — BP 138/67 | HR 115 | Temp 98.4°F | Resp 20

## 2022-02-11 DIAGNOSIS — Z5111 Encounter for antineoplastic chemotherapy: Secondary | ICD-10-CM | POA: Diagnosis not present

## 2022-02-11 DIAGNOSIS — Z170 Estrogen receptor positive status [ER+]: Secondary | ICD-10-CM | POA: Diagnosis not present

## 2022-02-11 DIAGNOSIS — C7951 Secondary malignant neoplasm of bone: Secondary | ICD-10-CM | POA: Diagnosis not present

## 2022-02-11 DIAGNOSIS — C50911 Malignant neoplasm of unspecified site of right female breast: Secondary | ICD-10-CM

## 2022-02-11 DIAGNOSIS — C50411 Malignant neoplasm of upper-outer quadrant of right female breast: Secondary | ICD-10-CM

## 2022-02-11 DIAGNOSIS — J90 Pleural effusion, not elsewhere classified: Secondary | ICD-10-CM | POA: Diagnosis not present

## 2022-02-11 DIAGNOSIS — C787 Secondary malignant neoplasm of liver and intrahepatic bile duct: Secondary | ICD-10-CM | POA: Diagnosis not present

## 2022-02-11 LAB — CMP (CANCER CENTER ONLY)
ALT: 8 U/L (ref 0–44)
AST: 15 U/L (ref 15–41)
Albumin: 4.5 g/dL (ref 3.5–5.0)
Alkaline Phosphatase: 110 U/L (ref 38–126)
Anion gap: 6 (ref 5–15)
BUN: 23 mg/dL (ref 8–23)
CO2: 25 mmol/L (ref 22–32)
Calcium: 10.1 mg/dL (ref 8.9–10.3)
Chloride: 106 mmol/L (ref 98–111)
Creatinine: 1.43 mg/dL — ABNORMAL HIGH (ref 0.44–1.00)
GFR, Estimated: 39 mL/min — ABNORMAL LOW (ref >60)
Glucose, Bld: 111 mg/dL — ABNORMAL HIGH (ref 70–99)
Potassium: 4.5 mmol/L (ref 3.5–5.1)
Sodium: 137 mmol/L (ref 135–145)
Total Bilirubin: 0.3 mg/dL (ref 0.3–1.2)
Total Protein: 8.2 g/dL — ABNORMAL HIGH (ref 6.5–8.1)

## 2022-02-11 LAB — CBC WITH DIFFERENTIAL (CANCER CENTER ONLY)
Abs Immature Granulocytes: 0.03 10*3/uL (ref 0.00–0.07)
Basophils Absolute: 0.2 10*3/uL — ABNORMAL HIGH (ref 0.0–0.1)
Basophils Relative: 2 %
Eosinophils Absolute: 0.1 10*3/uL (ref 0.0–0.5)
Eosinophils Relative: 1 %
HCT: 41.7 % (ref 36.0–46.0)
Hemoglobin: 14.1 g/dL (ref 12.0–15.0)
Immature Granulocytes: 0 %
Lymphocytes Relative: 10 %
Lymphs Abs: 0.9 10*3/uL (ref 0.7–4.0)
MCH: 33.7 pg (ref 26.0–34.0)
MCHC: 33.8 g/dL (ref 30.0–36.0)
MCV: 99.8 fL (ref 80.0–100.0)
Monocytes Absolute: 0.8 10*3/uL (ref 0.1–1.0)
Monocytes Relative: 9 %
Neutro Abs: 6.8 10*3/uL (ref 1.7–7.7)
Neutrophils Relative %: 78 %
Platelet Count: 278 10*3/uL (ref 150–400)
RBC: 4.18 MIL/uL (ref 3.87–5.11)
RDW: 13.1 % (ref 11.5–15.5)
WBC Count: 8.7 10*3/uL (ref 4.0–10.5)
nRBC: 0 % (ref 0.0–0.2)

## 2022-02-11 MED ORDER — FULVESTRANT 250 MG/5ML IM SOSY
500.0000 mg | PREFILLED_SYRINGE | Freq: Once | INTRAMUSCULAR | Status: AC
  Administered 2022-02-11: 500 mg via INTRAMUSCULAR
  Filled 2022-02-11: qty 10

## 2022-02-11 MED ORDER — DENOSUMAB 120 MG/1.7ML ~~LOC~~ SOLN
120.0000 mg | Freq: Once | SUBCUTANEOUS | Status: AC
  Administered 2022-02-11: 120 mg via SUBCUTANEOUS
  Filled 2022-02-11: qty 1.7

## 2022-02-11 NOTE — Progress Notes (Signed)
Pt is not having root canal procedure this month, she is not sure when she is getting it done. Per Dr.Gudena, orders are signed and pt will receive Xgeva today.

## 2022-02-12 LAB — CANCER ANTIGEN 27.29: CA 27.29: 842.1 U/mL — ABNORMAL HIGH (ref 0.0–38.6)

## 2022-02-25 ENCOUNTER — Other Ambulatory Visit: Payer: Self-pay | Admitting: Hematology and Oncology

## 2022-02-25 ENCOUNTER — Other Ambulatory Visit: Payer: Self-pay | Admitting: Radiology

## 2022-02-25 DIAGNOSIS — K769 Liver disease, unspecified: Secondary | ICD-10-CM

## 2022-02-26 ENCOUNTER — Ambulatory Visit (HOSPITAL_COMMUNITY)
Admission: RE | Admit: 2022-02-26 | Discharge: 2022-02-26 | Disposition: A | Discharge status: Home / Self Care | Payer: Medicare Other | Source: Ambulatory Visit | Attending: Hematology and Oncology | Admitting: Hematology and Oncology

## 2022-02-26 ENCOUNTER — Other Ambulatory Visit: Payer: Self-pay

## 2022-02-26 ENCOUNTER — Encounter (HOSPITAL_COMMUNITY): Payer: Self-pay

## 2022-02-26 DIAGNOSIS — K769 Liver disease, unspecified: Secondary | ICD-10-CM | POA: Diagnosis not present

## 2022-02-26 DIAGNOSIS — E119 Type 2 diabetes mellitus without complications: Secondary | ICD-10-CM | POA: Diagnosis not present

## 2022-02-26 DIAGNOSIS — E7800 Pure hypercholesterolemia, unspecified: Secondary | ICD-10-CM | POA: Insufficient documentation

## 2022-02-26 DIAGNOSIS — Z853 Personal history of malignant neoplasm of breast: Secondary | ICD-10-CM | POA: Diagnosis not present

## 2022-02-26 DIAGNOSIS — R160 Hepatomegaly, not elsewhere classified: Secondary | ICD-10-CM | POA: Diagnosis not present

## 2022-02-26 DIAGNOSIS — Z85118 Personal history of other malignant neoplasm of bronchus and lung: Secondary | ICD-10-CM | POA: Diagnosis not present

## 2022-02-26 DIAGNOSIS — Z170 Estrogen receptor positive status [ER+]: Secondary | ICD-10-CM | POA: Diagnosis not present

## 2022-02-26 DIAGNOSIS — K5710 Diverticulosis of small intestine without perforation or abscess without bleeding: Secondary | ICD-10-CM | POA: Diagnosis not present

## 2022-02-26 DIAGNOSIS — J9811 Atelectasis: Secondary | ICD-10-CM | POA: Insufficient documentation

## 2022-02-26 DIAGNOSIS — M8580 Other specified disorders of bone density and structure, unspecified site: Secondary | ICD-10-CM | POA: Insufficient documentation

## 2022-02-26 DIAGNOSIS — J90 Pleural effusion, not elsewhere classified: Secondary | ICD-10-CM | POA: Insufficient documentation

## 2022-02-26 DIAGNOSIS — J45909 Unspecified asthma, uncomplicated: Secondary | ICD-10-CM | POA: Insufficient documentation

## 2022-02-26 DIAGNOSIS — K219 Gastro-esophageal reflux disease without esophagitis: Secondary | ICD-10-CM | POA: Insufficient documentation

## 2022-02-26 DIAGNOSIS — C7951 Secondary malignant neoplasm of bone: Secondary | ICD-10-CM | POA: Insufficient documentation

## 2022-02-26 DIAGNOSIS — C787 Secondary malignant neoplasm of liver and intrahepatic bile duct: Secondary | ICD-10-CM | POA: Insufficient documentation

## 2022-02-26 DIAGNOSIS — C50411 Malignant neoplasm of upper-outer quadrant of right female breast: Secondary | ICD-10-CM | POA: Diagnosis not present

## 2022-02-26 DIAGNOSIS — C50919 Malignant neoplasm of unspecified site of unspecified female breast: Secondary | ICD-10-CM | POA: Diagnosis not present

## 2022-02-26 LAB — GLUCOSE, CAPILLARY
Glucose-Capillary: 125 mg/dL — ABNORMAL HIGH (ref 70–99)
Glucose-Capillary: 81 mg/dL (ref 70–99)

## 2022-02-26 LAB — CBC
HCT: 42.3 % (ref 36.0–46.0)
Hemoglobin: 14.2 g/dL (ref 12.0–15.0)
MCH: 33.6 pg (ref 26.0–34.0)
MCHC: 33.6 g/dL (ref 30.0–36.0)
MCV: 100 fL (ref 80.0–100.0)
Platelets: 338 10*3/uL (ref 150–400)
RBC: 4.23 MIL/uL (ref 3.87–5.11)
RDW: 13.2 % (ref 11.5–15.5)
WBC: 10 10*3/uL (ref 4.0–10.5)
nRBC: 0 % (ref 0.0–0.2)

## 2022-02-26 LAB — PROTIME-INR
INR: 1 (ref 0.8–1.2)
Prothrombin Time: 13.1 seconds (ref 11.4–15.2)

## 2022-02-26 MED ORDER — SODIUM CHLORIDE 0.9 % IV SOLN: INTRAVENOUS | Status: DC

## 2022-02-26 MED ORDER — MIDAZOLAM HCL 2 MG/2ML IJ SOLN
INTRAMUSCULAR | Status: AC | PRN
  Administered 2022-02-26: .5 mg via INTRAVENOUS
  Administered 2022-02-26: 1 mg via INTRAVENOUS

## 2022-02-26 MED ORDER — GELATIN ABSORBABLE 12-7 MM EX MISC
CUTANEOUS | Status: AC
  Filled 2022-02-26: qty 1

## 2022-02-26 MED ORDER — FENTANYL CITRATE (PF) 100 MCG/2ML IJ SOLN
INTRAMUSCULAR | Status: AC
  Filled 2022-02-26: qty 2

## 2022-02-26 MED ORDER — FENTANYL CITRATE (PF) 100 MCG/2ML IJ SOLN
INTRAMUSCULAR | Status: AC | PRN
  Administered 2022-02-26 (×4): 25 ug via INTRAVENOUS

## 2022-02-26 MED ORDER — LIDOCAINE HCL (PF) 1 % IJ SOLN
INTRAMUSCULAR | Status: AC
  Filled 2022-02-26: qty 30

## 2022-02-26 MED ORDER — MIDAZOLAM HCL 2 MG/2ML IJ SOLN
INTRAMUSCULAR | Status: AC
  Filled 2022-02-26: qty 2

## 2022-02-26 MED ORDER — SODIUM CHLORIDE 0.9 % IV SOLN
INTRAVENOUS | Status: AC | PRN
  Administered 2022-02-26: 10 mL/h via INTRAVENOUS

## 2022-02-26 NOTE — H&P (Signed)
Chief Complaint: Patient was seen in consultation today for metastatic breast cancer  at the request of [facility]  Referring Physician(s): Nicholas Lose  Supervising Physician: Mir, Sharen Heck  Patient Status: [facility] - Out-pt  History of Present Illness: Barbara Parks is a 71 y.o. female with PMH of asthma, breast cancer with mets to lung, DM II, GERD, hypercholesteremia, osteopenia, chemoradiation and SOB. Pt has been followed and treated by oncology for malignant neoplasm of right breast since 2005. Pt underwent CT chest 08/2011 after c/o SOB and cough that found right lower lobe nodules that was later determined to have metastasized from breast cancer. Pt underwent liver lesion biopsy with IR service 08/16/2019 that was also found to have originated from breast cancer. Her liver MRI 01/12/22 showed progression of metastatic disease within the liver with increased number and size of hepatic metastases. Pt was referred to IR for liver lesion biopsy by Dr. Nicholas Lose. Procedure was approved by Dr. Kathlene Cote.    MR Liver Contrast 01/12/22:   IMPRESSION: 1. Progression of metastatic disease in the liver, with increased number and size of hepatic metastasis, as detailed above. 2. Widespread metastatic disease to the bones again noted. 3. Moderate right pleural effusion lying dependently with passive atelectasis in the right lower lobe. 4. Duodenal diverticulosis. 5. Probable adenomyomatosis in the fundus of the gallbladder.   Past Medical History:  Diagnosis Date   Arthritis    left hip   Asthma    breast ca 2005   breast/chemo R mastectomy   Breast cancer ([place])    Dermatitis    Diabetes mellitus without complication ([place])    GERD (gastroesophageal reflux disease)    Hypercholesteremia    Metastasis to lung ([place]) dx'd 08/2011   Osteopenia due to cancer therapy 09/09/2013   Palpitations    Personal history of chemotherapy    Shortness of breath     Past Surgical History:   Procedure Laterality Date   BREAST SURGERY  2005   right   CATARACT EXTRACTION W/PHACO Left 01/18/2014   Procedure: CATARACT EXTRACTION PHACO AND INTRAOCULAR LENS PLACEMENT ([place]);  Surgeon: Elta Guadeloupe T. Gershon Crane, MD;  Location: [facility];  Service: Ophthalmology;  Laterality: Left;  CDE 15.79   CATARACT EXTRACTION W/PHACO Right 02/08/2014   Procedure: CATARACT EXTRACTION PHACO AND INTRAOCULAR LENS PLACEMENT ([facility]);  Surgeon: Elta Guadeloupe T. Gershon Crane, MD;  Location: [facility];  Service: Ophthalmology;  Laterality: Right;  CDE 4.41   CHEST TUBE INSERTION  09/11/2011   Procedure: INSERTION PLEURAL DRAINAGE CATHETER;  Surgeon: Gaye Pollack, MD;  Location: [place];  Service: Thoracic;  Laterality: Right;   MASTECTOMY Right    PERICARDIAL WINDOW  09/11/2011   Procedure: PERICARDIAL WINDOW;  Surgeon: Gaye Pollack, MD;  Location: [place];  Service: Thoracic;  Laterality: N/A;   REMOVAL OF PLEURAL DRAINAGE CATHETER  12/19/2011   Procedure: REMOVAL OF PLEURAL DRAINAGE CATHETER;  Surgeon: Gaye Pollack, MD;  Location: [place];  Service: Thoracic;  Laterality: Right;  TO BE DONE IN MINOR ROOM, [place] Left 06/07/2015   Procedure: LEFT TOTAL HIP ARTHROPLASTY ANTERIOR APPROACH;  Surgeon: Gaynelle Arabian, MD;  Location: [facility];  Service: Orthopedics;  Laterality: Left;   VIDEO BRONCHOSCOPY  09/11/2011   Procedure: VIDEO BRONCHOSCOPY;  Surgeon: Gaye Pollack, MD;  Location: [facility];  Service: Thoracic;  Laterality: N/A;    Allergies: Aspirin, Latex, and Nsaids  Medications: Prior to Admission medications  Medication Sig Start Date End Date Taking? Authorizing Provider  gemfibrozil (LOPID) 600 MG tablet Take 1 tablet (600 mg total) by mouth 2 (two) times daily before a meal. 09/13/21  Yes Nicholas Lose, MD  metFORMIN (GLUCOPHAGE-XR) 500 MG 24 hr tablet Take 1,500 mg by mouth daily with supper. Followed by [facility] 06/11/18  Yes Magrinat, Virgie Dad, MD  omeprazole (PRILOSEC OTC)  20 MG tablet Take 20 mg by mouth daily as needed (acid reflux).   Yes [provider]  ondansetron (ZOFRAN) 8 MG tablet Take 1 tablet (8 mg total) by mouth every 8 (eight) hours as needed for nausea or vomiting. 12/31/21  Yes Nicholas Lose, MD  prochlorperazine (COMPAZINE) 10 MG tablet Take 1 tablet (10 mg total) by mouth every 8 (eight) hours as needed for nausea or vomiting. 12/31/21  Yes Nicholas Lose, MD  VERZENIO 100 MG tablet TAKE 1 TABLET BY MOUTH TWICE DAILY 02/25/22  Yes Nicholas Lose, MD  cholecalciferol (VITAMIN D3) 25 MCG (1000 UT) tablet Take 2 tablets (2,000 Units total) by mouth daily. 09/03/18   Magrinat, Virgie Dad, MD  loperamide (IMODIUM) 2 MG capsule Take 2 mg by mouth 4 (four) times daily as needed for diarrhea or loose stools. Reported on 11/30/2015    [provider]     Family History  Problem Relation Age of Onset   Cancer Mother        breast   Heart disease Father    Diabetes Other    Anesthesia problems Neg Hx     Social History   Socioeconomic History   Marital status: Single    Spouse name: Not on file   Number of children: Not on file   Years of education: Not on file   Highest education level: Not on file  Occupational History   Not on file  Tobacco Use   Smoking status: Former    Packs/day: 1.50    Years: 30.00    Total pack years: 45.00    Types: Cigarettes    Quit date: 09/10/2007    Years since quitting: 14.4   Smokeless tobacco: Never  Substance and Sexual Activity   Alcohol use: No   Drug use: No   Sexual activity: Not Currently  Other Topics Concern   Not on file  Social History Narrative   Not on file   Social Determinants of Health   Financial Resource Strain: Not on file  Food Insecurity: Not on file  Transportation Needs: Not on file  Physical Activity: Not on file  Stress: Not on file  Social Connections: Not on file    Review of Systems: A 12 point ROS discussed and pertinent positives are indicated in the HPI  above.  All other systems are negative.  Review of Systems  Constitutional:  Positive for activity change and unexpected weight change. Negative for chills and fever.  Respiratory:  Negative for shortness of breath.   Cardiovascular:  Negative for chest pain and leg swelling.  Gastrointestinal:  Negative for abdominal pain, nausea and vomiting.  Neurological:  Negative for dizziness and headaches.    Vital Signs: BP 104/76   Pulse (!) 120   Temp 97.8 F (36.6 C) (Temporal)   Resp 20   Ht '5\' 4"'$  (1.626 m)   Wt 146 lb (66.2 kg)   SpO2 98%   BMI 25.06 kg/m     Physical Exam Vitals reviewed.  Constitutional:      General: She is not in acute distress.  Appearance: She is ill-appearing.  HENT:     Head: Normocephalic and atraumatic.     Mouth/Throat:     Mouth: Mucous membranes are dry.     Pharynx: Oropharynx is clear.  Eyes:     Extraocular Movements: Extraocular movements intact.     Pupils: Pupils are equal, round, and reactive to light.  Cardiovascular:     Rate and Rhythm: Regular rhythm. Tachycardia present.     Pulses: Normal pulses.     Heart sounds: Normal heart sounds.  Pulmonary:     Effort: Pulmonary effort is normal. No respiratory distress.     Breath sounds: Normal breath sounds.  Abdominal:     General: Bowel sounds are normal. There is no distension.     Palpations: Abdomen is soft.     Tenderness: There is no abdominal tenderness. There is no guarding.  Musculoskeletal:     Right lower leg: No edema.     Left lower leg: No edema.  Skin:    General: Skin is warm and dry.  Neurological:     Mental Status: She is alert and oriented to person, place, and time.  Psychiatric:        Mood and Affect: Mood normal.        Behavior: Behavior normal.        Thought Content: Thought content normal.        Judgment: Judgment normal.     Imaging: No results found.  Labs:  CBC: Recent Labs    12/31/21 1344 01/14/22 1145 02/11/22 1403  02/26/22 1123  WBC 7.6 8.8 8.7 10.0  HGB 13.6 13.4 14.1 14.2  HCT 40.2 39.9 41.7 42.3  PLT 292 278 278 338    COAGS: Recent Labs    02/26/22 1123  INR 1.0    BMP: Recent Labs    12/17/21 1356 12/31/21 1344 01/14/22 1145 02/11/22 1403  NA 136 136 137 137  K 4.5 4.3 4.2 4.5  CL 107 105 104 106  CO2 21* '23 25 25  '$ GLUCOSE 110* 92 97 111*  BUN 26* 26* 25* 23  CALCIUM 9.9 10.5* 10.8* 10.1  CREATININE 1.45* 1.39* 1.39* 1.43*  GFRNONAA 39* 41* 41* 39*    LIVER FUNCTION TESTS: Recent Labs    12/17/21 1356 12/31/21 1344 01/14/22 1145 02/11/22 1403  BILITOT 0.3 0.3 0.3 0.3  AST 15 13* 14* 15  ALT '8 7 7 8  '$ ALKPHOS 91 100 105 110  PROT 7.9 7.7 8.0 8.2*  ALBUMIN 4.3 4.3 4.4 4.5    TUMOR MARKERS: No results for input(s): "AFPTM", "CEA", "CA199", "CHROMGRNA" in the last 8760 hours.  Assessment and Plan: History of asthma, breast cancer with mets to lung, DM II, GERD, hypercholesteremia, osteopenia, chemoradiation and SOB. Pt has been followed and treated by oncology for malignant neoplasm of right breast since 2005. Pt underwent CT chest 08/2011 after c/o SOB and cough that found right lower lobe nodules that was later determined to have metastasized from breast cancer. Pt underwent liver lesion biopsy with IR service 08/16/2019 that was also found to have originated from breast cancer. Her liver MRI 01/12/22 showed progression of metastatic disease within the liver with increased number and size of hepatic metastases. Pt was referred to IR for liver lesion biopsy by Dr. Nicholas Lose. Procedure was approved by Dr. Kathlene Cote.    MR Liver Contrast 01/12/22:   IMPRESSION: 1. Progression of metastatic disease in the liver, with increased number and size of hepatic metastasis, as  detailed above. 2. Widespread metastatic disease to the bones again noted. 3. Moderate right pleural effusion lying dependently with passive atelectasis in the right lower lobe. 4. Duodenal  diverticulosis. 5. Probable adenomyomatosis in the fundus of the gallbladder.  Pt resting in bed. She is A&O, calm and pleasant. She is in no distress.  Pt states she is NPO per order.  No rx thinners, last ibuprofen 02/24/22.   Risks and benefits of liver lesion biopsy with moderate sedation  was discussed with the patient and/or patient's family including, but not limited to bleeding, infection, damage to adjacent structures or low yield requiring additional tests.  All of the questions were answered and there is agreement to proceed.  Consent signed and in chart.   Thank you for this interesting consult.  I greatly enjoyed meeting Barbara Parks and look forward to participating in their care.  A copy of this report was sent to the requesting provider on this date.  Electronically Signed: Tyson Alias, NP 02/26/2022, 12:57 PM   I spent a total of 20 minutes in face to face in clinical consultation, greater than 50% of which was counseling/coordinating care for metastatic breast cancer.

## 2022-02-26 NOTE — Procedures (Signed)
Interventional Radiology Procedure Note  Procedure: US guided left liver lesion biopsy  Indication: Metastatic breast ca  Findings: Please refer to procedural dictation for full description.  Complications: None  EBL: < 10 mL  Miachel Roux, MD [phone]

## 2022-03-01 NOTE — Progress Notes (Signed)
Patient Care Team: [name], MD as PCP - General (Family Medicine) Jerline Pain, MD (Cardiology) Gaye Pollack, MD (Cardiothoracic Surgery) Raina Mina, RPH-CPP (Pharmacist) Renaldo Harrison, MD as Referring Physician (Radiation Oncology) Nicholas Lose, MD as Consulting Physician (Hematology and Oncology)  DIAGNOSIS:  Encounter Diagnosis  Name Primary?   Malignant neoplasm of upper-outer quadrant of right breast in female, estrogen receptor positive ([place])     SUMMARY OF ONCOLOGIC HISTORY: Oncology History  Malignant neoplasm of upper-outer quadrant of right breast in female, estrogen receptor positive ([place])  03/22/2004 Initial Diagnosis   right mastectomy and sentinel lymph node sampling 03/22/2004 for a right upper-outer quadrant pT2 pN1, stage IIB invasive ductal carcinoma with lobular features, grade 2, estrogen receptor and progesterone receptor positive, HER-2 negative, with an MIB-1 of 9%   05/2004 - 09/18/2004 Chemotherapy   adjuvant chemotherapy with dose dense doxorubicin and cyclophosphamide x4 cycles (first cycle delayed one week) followed by dose dense paclitaxel x4 was completed 09/18/2004   09/2004 - 2009 Anti-estrogen oral therapy   tamoxifen started March 2006, discontinued 2009   08/2011 Relapse/Recurrence   Metastatic disease:large pericardial effusion, large right pleural effusion and possible right middle lobe bronchial obstruction  status post pericardial window placement, fiberoptic bronchoscopy and right Pleurx placement 09/11/2011, with biopsy of the bronchus intermedius and pericardium positive for metastatic breast cancer, ER 91%, PR 100% p with an MIB-1 of 35%, and no HER-2 amplification, ratio being 1.37   10/10/2011 - 07/2019 Anti-estrogen oral therapy   BOLERO-4 trial 10/10/2011, receiving letrozole and everolimus (stopped for progression)   08/16/2019 Miscellaneous   foundation 1 requested on liver biopsy from 08/16/2019 shows a stable  microsatellite status with 1 mutation/MB, amplification of C11orf30, FGF10 and RICTOR, with mutations in ESR1 and PTEN   08/16/2019 Miscellaneous   Liver biopsy: Metastatic carcinoma to the liver consistent with breast primary, ER 100%, PR 100%, Ki-67 5%, HER2 2+ by IHC, FISH HER2 negative   08/24/2019 -  Anti-estrogen oral therapy   fulvestrant started 08/24/2019, repeated every 4 weeks Palbociclib 125 mg daily 21 days on 7 days off started 08/25/2019 Switched to ribociclib s starting 08/22/2020   02/28/2021 -  Radiation Therapy   palliative radiation (Dr. Lynnette Caffey in [place]) 02/28/2021 to right hip area   10/27/2021 -  Anti-estrogen oral therapy   Abemaciclib with fulvestrant   01/14/2022 Miscellaneous   Guardant360: ESR 1 mutation (no other mutations) MSI high was not detected, PTEN, T p53, EGFR amplification, RB1 mutations detected without any approved treatment options     CHIEF COMPLIANT: Follow-up fulvestrant, Delton See  INTERVAL HISTORY: Barbara Parks is a 71 year old above-mentioned with metastatic breast cancer. She presents to the clinic today for a follow-up.  She tolerated the liver biopsy reasonably well.  She did have some pain and discomfort.  This lasted for a week.  She is very depressed today because her dog was diagnosed with prostate cancer and most likely will need to be put down next week.  She has been unable to rest while taking care of him.   ALLERGIES:  is allergic to aspirin, latex, and nsaids.  MEDICATIONS:  Current Outpatient Medications  Medication Sig Dispense Refill   cholecalciferol (VITAMIN D3) 25 MCG (1000 UT) tablet Take 2 tablets (2,000 Units total) by mouth daily. 180 tablet 4   gemfibrozil (LOPID) 600 MG tablet Take 1 tablet (600 mg total) by mouth 2 (two) times daily before a meal. 180 tablet 2   loperamide (IMODIUM)  2 MG capsule Take 2 mg by mouth 4 (four) times daily as needed for diarrhea or loose stools. Reported on 11/30/2015     metFORMIN  (GLUCOPHAGE-XR) 500 MG 24 hr tablet Take 1,500 mg by mouth daily with supper. Followed by [facility]  3   omeprazole (PRILOSEC OTC) 20 MG tablet Take 20 mg by mouth daily as needed (acid reflux).     ondansetron (ZOFRAN) 8 MG tablet Take 1 tablet (8 mg total) by mouth every 8 (eight) hours as needed for nausea or vomiting. 30 tablet 2   prochlorperazine (COMPAZINE) 10 MG tablet Take 1 tablet (10 mg total) by mouth every 8 (eight) hours as needed for nausea or vomiting. 30 tablet 2   VERZENIO 100 MG tablet TAKE 1 TABLET BY MOUTH TWICE DAILY 56 tablet 0   No current facility-administered medications for this visit.    PHYSICAL EXAMINATION: ECOG PERFORMANCE STATUS: 1 - Symptomatic but completely ambulatory  Vitals:   03/11/22 1447  BP: 118/79  Pulse: (!) 115  Resp: 17  Temp: (!) 97.5 F (36.4 C)  SpO2: 96%   Filed Weights   03/11/22 1447  Weight: 150 lb 6.4 oz (68.2 kg)      LABORATORY DATA:  I have reviewed the data as listed    Latest Ref Rng & Units 03/11/2022    2:18 PM 02/11/2022    2:03 PM 01/14/2022   11:45 AM  CMP  Glucose 70 - 99 mg/dL 116  111  97   BUN 8 - 23 mg/dL 22  23  25    Creatinine 0.44 - 1.00 mg/dL 1.36  1.43  1.39   Sodium 135 - 145 mmol/L 133  137  137   Potassium 3.5 - 5.1 mmol/L 4.5  4.5  4.2   Chloride 98 - 111 mmol/L 105  106  104   CO2 22 - 32 mmol/L 21  25  25    Calcium 8.9 - 10.3 mg/dL 10.0  10.1  10.8   Total Protein 6.5 - 8.1 g/dL 7.8  8.2  8.0   Total Bilirubin 0.3 - 1.2 mg/dL 0.3  0.3  0.3   Alkaline Phos 38 - 126 U/L 102  110  105   AST 15 - 41 U/L 15  15  14    ALT 0 - 44 U/L 9  8  7      Lab Results  Component Value Date   WBC 8.4 03/11/2022   HGB 13.8 03/11/2022   HCT 39.6 03/11/2022   MCV 96.6 03/11/2022   PLT 320 03/11/2022   NEUTROABS 6.4 03/11/2022    ASSESSMENT & PLAN:  Malignant neoplasm of upper-outer quadrant of right breast in female, estrogen receptor positive ([place]) Metastatic breast cancer (pericardial  effusion, pleural effusion, right middle lobe lung lesion, ER 91%, PR 90%, Ki-67 35%, HER2 negative diagnosed February 2023)   (Prior history of right mastectomy 2005, adjuvant chemo with Adriamycin Cytoxan and Taxol followed by tamoxifen)   Current treatment:  1. Ribociclib, fulvestrant, Xgeva, ribociclib discontinued 10/18/2021  2. abemaciclib started 10/27/2021 Foundation 1 in 2021: MSI stable, TMB 1, ESR 1 mutation, PTEN, FGF 10, RICTOR amplifications   Scans:   Liver MRI 01/12/2022: Progression of metastatic disease in the liver with increased number and size of hepatic metastases, widespread metastatic disease to the bones, moderate pleural effusion   Guardant360 01/14/2022: No actionable mutations.  ESR 1 mutation.   Discussion: Treatment Options discussed 1. VICTORIA-1 clinical trial: Patient was not found to be  eligible for the clinical trial 2. Xeloda 3. Enhertu: Patient appears to be most interested in this option  We are also looking at other options based on [facility] molecular testing.   Liver biopsy 02/26/2022: Metastatic carcinoma consistent with breast primary. Molecular testing has been ordered and we are waiting for results. I will call her with the result of this test and proceed with the treatment plan accordingly.  Her dog has prostate cancer and is likely to be pulled out next week and she is very sad and depressed about that.   No orders of the defined types were placed in this encounter.  The patient has a good understanding of the overall plan. she agrees with it. she will call with any problems that may develop before the next visit here. Total time spent: 30 mins including face to face time and time spent for planning, charting and co-ordination of care   Harriette Ohara, MD 03/11/22    I Gardiner Coins am scribing for Dr. Lindi Adie  I have reviewed the above documentation for accuracy and completeness, and I agree with the above.

## 2022-03-11 ENCOUNTER — Inpatient Hospital Stay: Payer: Medicare Other

## 2022-03-11 ENCOUNTER — Other Ambulatory Visit: Payer: Self-pay

## 2022-03-11 ENCOUNTER — Inpatient Hospital Stay: Payer: Medicare Other | Attending: Oncology | Admitting: Hematology and Oncology

## 2022-03-11 ENCOUNTER — Encounter: Payer: Self-pay | Admitting: *Deleted

## 2022-03-11 DIAGNOSIS — Z9011 Acquired absence of right breast and nipple: Secondary | ICD-10-CM | POA: Insufficient documentation

## 2022-03-11 DIAGNOSIS — C787 Secondary malignant neoplasm of liver and intrahepatic bile duct: Secondary | ICD-10-CM | POA: Insufficient documentation

## 2022-03-11 DIAGNOSIS — J90 Pleural effusion, not elsewhere classified: Secondary | ICD-10-CM | POA: Insufficient documentation

## 2022-03-11 DIAGNOSIS — Z170 Estrogen receptor positive status [ER+]: Secondary | ICD-10-CM | POA: Insufficient documentation

## 2022-03-11 DIAGNOSIS — Z79899 Other long term (current) drug therapy: Secondary | ICD-10-CM | POA: Insufficient documentation

## 2022-03-11 DIAGNOSIS — I3139 Other pericardial effusion (noninflammatory): Secondary | ICD-10-CM | POA: Insufficient documentation

## 2022-03-11 DIAGNOSIS — R911 Solitary pulmonary nodule: Secondary | ICD-10-CM | POA: Diagnosis not present

## 2022-03-11 DIAGNOSIS — C7951 Secondary malignant neoplasm of bone: Secondary | ICD-10-CM | POA: Insufficient documentation

## 2022-03-11 DIAGNOSIS — Z5111 Encounter for antineoplastic chemotherapy: Secondary | ICD-10-CM | POA: Diagnosis not present

## 2022-03-11 DIAGNOSIS — C50911 Malignant neoplasm of unspecified site of right female breast: Secondary | ICD-10-CM

## 2022-03-11 DIAGNOSIS — C50411 Malignant neoplasm of upper-outer quadrant of right female breast: Secondary | ICD-10-CM | POA: Diagnosis not present

## 2022-03-11 LAB — CBC WITH DIFFERENTIAL (CANCER CENTER ONLY)
Abs Immature Granulocytes: 0.05 10*3/uL (ref 0.00–0.07)
Basophils Absolute: 0.1 10*3/uL (ref 0.0–0.1)
Basophils Relative: 2 %
Eosinophils Absolute: 0.1 10*3/uL (ref 0.0–0.5)
Eosinophils Relative: 1 %
HCT: 39.6 % (ref 36.0–46.0)
Hemoglobin: 13.8 g/dL (ref 12.0–15.0)
Immature Granulocytes: 1 %
Lymphocytes Relative: 10 %
Lymphs Abs: 0.9 10*3/uL (ref 0.7–4.0)
MCH: 33.7 pg (ref 26.0–34.0)
MCHC: 34.8 g/dL (ref 30.0–36.0)
MCV: 96.6 fL (ref 80.0–100.0)
Monocytes Absolute: 0.9 10*3/uL (ref 0.1–1.0)
Monocytes Relative: 10 %
Neutro Abs: 6.4 10*3/uL (ref 1.7–7.7)
Neutrophils Relative %: 76 %
Platelet Count: 320 10*3/uL (ref 150–400)
RBC: 4.1 MIL/uL (ref 3.87–5.11)
RDW: 13.3 % (ref 11.5–15.5)
WBC Count: 8.4 10*3/uL (ref 4.0–10.5)
nRBC: 0 % (ref 0.0–0.2)

## 2022-03-11 LAB — CMP (CANCER CENTER ONLY)
ALT: 9 U/L (ref 0–44)
AST: 15 U/L (ref 15–41)
Albumin: 4.4 g/dL (ref 3.5–5.0)
Alkaline Phosphatase: 102 U/L (ref 38–126)
Anion gap: 7 (ref 5–15)
BUN: 22 mg/dL (ref 8–23)
CO2: 21 mmol/L — ABNORMAL LOW (ref 22–32)
Calcium: 10 mg/dL (ref 8.9–10.3)
Chloride: 105 mmol/L (ref 98–111)
Creatinine: 1.36 mg/dL — ABNORMAL HIGH (ref 0.44–1.00)
GFR, Estimated: 42 mL/min — ABNORMAL LOW (ref >60)
Glucose, Bld: 116 mg/dL — ABNORMAL HIGH (ref 70–99)
Potassium: 4.5 mmol/L (ref 3.5–5.1)
Sodium: 133 mmol/L — ABNORMAL LOW (ref 135–145)
Total Bilirubin: 0.3 mg/dL (ref 0.3–1.2)
Total Protein: 7.8 g/dL (ref 6.5–8.1)

## 2022-03-11 MED ORDER — DENOSUMAB 120 MG/1.7ML ~~LOC~~ SOLN
120.0000 mg | Freq: Once | SUBCUTANEOUS | Status: AC
  Administered 2022-03-11: 120 mg via SUBCUTANEOUS

## 2022-03-11 MED ORDER — FULVESTRANT 250 MG/5ML IM SOSY
500.0000 mg | PREFILLED_SYRINGE | Freq: Once | INTRAMUSCULAR | Status: AC
  Administered 2022-03-11: 500 mg via INTRAMUSCULAR

## 2022-03-11 NOTE — Assessment & Plan Note (Addendum)
Metastatic breast cancer (pericardial effusion, pleural effusion, right middle lobe lung lesion, ER 91%, PR 90%, Ki-67 35%, HER2 negative diagnosed February 2023)  (Prior history of right mastectomy 2005, adjuvant chemo with Adriamycin Cytoxan and Taxol followed by tamoxifen)  Current treatment:  1. Ribociclib, fulvestrant, Xgeva,ribociclib discontinued 10/18/2021  2. abemaciclib started 10/27/2021 Foundation 1 in 2021: MSI stable, TMB 1, ESR 1 mutation, PTEN, FGF 10, RICTOR amplifications  Scans:  Liver MRI 01/12/2022: Progression of metastatic disease in the liver with increased number and size of hepatic metastases, widespread metastatic disease to the bones, moderate pleural effusion  Guardant360 01/14/2022: No actionable mutations.  ESR 1 mutation.  Discussion: Treatment Options discussed 1. VICTORIA-1 clinical trial 2. Xeloda 3. Enhertu  Liver biopsy 02/26/2022: Metastatic carcinoma consistent with breast primary. Molecular testing has been ordered and we are waiting for results.

## 2022-03-11 NOTE — Progress Notes (Signed)
Contacted pathology to add prognostic panel to 02/26/22 path. S/w Keisha in path.

## 2022-03-11 NOTE — Progress Notes (Signed)
Per MD request RN successful faxed [facility] testing request [phone]).

## 2022-03-12 LAB — CANCER ANTIGEN 27.29: CA 27.29: 800.6 U/mL — ABNORMAL HIGH (ref 0.0–38.6)

## 2022-03-13 LAB — SURGICAL PATHOLOGY

## 2022-03-14 ENCOUNTER — Telehealth: Payer: Self-pay | Admitting: *Deleted

## 2022-03-14 NOTE — Telephone Encounter (Signed)
Per MD request RN successfully scheduled pt MD f/u in 2 weeks.  RN attempt x1 to contact pt.  No answer, LVM with appt details.

## 2022-03-15 DIAGNOSIS — N1832 Chronic kidney disease, stage 3b: Secondary | ICD-10-CM | POA: Diagnosis not present

## 2022-03-15 DIAGNOSIS — C50411 Malignant neoplasm of upper-outer quadrant of right female breast: Secondary | ICD-10-CM | POA: Diagnosis not present

## 2022-03-15 DIAGNOSIS — E785 Hyperlipidemia, unspecified: Secondary | ICD-10-CM | POA: Diagnosis not present

## 2022-03-15 DIAGNOSIS — C7951 Secondary malignant neoplasm of bone: Secondary | ICD-10-CM | POA: Diagnosis not present

## 2022-03-15 DIAGNOSIS — C787 Secondary malignant neoplasm of liver and intrahepatic bile duct: Secondary | ICD-10-CM | POA: Diagnosis not present

## 2022-03-15 DIAGNOSIS — E1165 Type 2 diabetes mellitus with hyperglycemia: Secondary | ICD-10-CM | POA: Diagnosis not present

## 2022-03-19 ENCOUNTER — Other Ambulatory Visit: Payer: Self-pay | Admitting: Hematology and Oncology

## 2022-03-27 DIAGNOSIS — C50411 Malignant neoplasm of upper-outer quadrant of right female breast: Secondary | ICD-10-CM | POA: Diagnosis not present

## 2022-03-27 DIAGNOSIS — Z170 Estrogen receptor positive status [ER+]: Secondary | ICD-10-CM | POA: Diagnosis not present

## 2022-03-27 DIAGNOSIS — C787 Secondary malignant neoplasm of liver and intrahepatic bile duct: Secondary | ICD-10-CM | POA: Diagnosis not present

## 2022-03-27 NOTE — Progress Notes (Signed)
Patient Care Team: [name], MD as PCP - General (Family Medicine) Jerline Pain, MD (Cardiology) Gaye Pollack, MD (Cardiothoracic Surgery) Raina Mina, RPH-CPP (Pharmacist) Renaldo Harrison, MD as Referring Physician (Radiation Oncology) Nicholas Lose, MD as Consulting Physician (Hematology and Oncology)  DIAGNOSIS: No diagnosis found.  SUMMARY OF ONCOLOGIC HISTORY: Oncology History  Malignant neoplasm of upper-outer quadrant of right breast in female, estrogen receptor positive ([place])  03/22/2004 Initial Diagnosis   right mastectomy and sentinel lymph node sampling 03/22/2004 for a right upper-outer quadrant pT2 pN1, stage IIB invasive ductal carcinoma with lobular features, grade 2, estrogen receptor and progesterone receptor positive, HER-2 negative, with an MIB-1 of 9%   05/2004 - 09/18/2004 Chemotherapy   adjuvant chemotherapy with dose dense doxorubicin and cyclophosphamide x4 cycles (first cycle delayed one week) followed by dose dense paclitaxel x4 was completed 09/18/2004   09/2004 - 2009 Anti-estrogen oral therapy   tamoxifen started March 2006, discontinued 2009   08/2011 Relapse/Recurrence   Metastatic disease:large pericardial effusion, large right pleural effusion and possible right middle lobe bronchial obstruction  status post pericardial window placement, fiberoptic bronchoscopy and right Pleurx placement 09/11/2011, with biopsy of the bronchus intermedius and pericardium positive for metastatic breast cancer, ER 91%, PR 100% p with an MIB-1 of 35%, and no HER-2 amplification, ratio being 1.37   10/10/2011 - 07/2019 Anti-estrogen oral therapy   BOLERO-4 trial 10/10/2011, receiving letrozole and everolimus (stopped for progression)   08/16/2019 Miscellaneous   foundation 1 requested on liver biopsy from 08/16/2019 shows a stable microsatellite status with 1 mutation/MB, amplification of C11orf30, FGF10 and RICTOR, with mutations in ESR1 and PTEN   08/16/2019  Miscellaneous   Liver biopsy: Metastatic carcinoma to the liver consistent with breast primary, ER 100%, PR 100%, Ki-67 5%, HER2 2+ by IHC, FISH HER2 negative   08/24/2019 -  Anti-estrogen oral therapy   fulvestrant started 08/24/2019, repeated every 4 weeks Palbociclib 125 mg daily 21 days on 7 days off started 08/25/2019 Switched to ribociclib s starting 08/22/2020   02/28/2021 -  Radiation Therapy   palliative radiation (Dr. Lynnette Caffey in [place]) 02/28/2021 to right hip area   10/27/2021 -  Anti-estrogen oral therapy   Abemaciclib with fulvestrant   01/14/2022 Miscellaneous   Guardant360: ESR 1 mutation (no other mutations) MSI high was not detected, PTEN, T p53, EGFR amplification, RB1 mutations detected without any approved treatment options     CHIEF COMPLIANT: Follow-up Metastatic carcinoma   INTERVAL HISTORY: Barbara Parks is a Barbara Parks is a 71 year old above-mentioned with metastatic breast cancer who was switched from ribociclib to abemaciclib. She presents to the clinic today    ALLERGIES:  is allergic to aspirin, latex, and nsaids.  MEDICATIONS:  Current Outpatient Medications  Medication Sig Dispense Refill   cholecalciferol (VITAMIN D3) 25 MCG (1000 UT) tablet Take 2 tablets (2,000 Units total) by mouth daily. 180 tablet 4   gemfibrozil (LOPID) 600 MG tablet Take 1 tablet (600 mg total) by mouth 2 (two) times daily before a meal. 180 tablet 2   loperamide (IMODIUM) 2 MG capsule Take 2 mg by mouth 4 (four) times daily as needed for diarrhea or loose stools. Reported on 11/30/2015     metFORMIN (GLUCOPHAGE-XR) 500 MG 24 hr tablet Take 1,500 mg by mouth daily with supper. Followed by [facility]  3   omeprazole (PRILOSEC OTC) 20 MG tablet Take 20 mg by mouth daily as needed (acid reflux).     ondansetron (  ZOFRAN) 8 MG tablet Take 1 tablet (8 mg total) by mouth every 8 (eight) hours as needed for nausea or vomiting. 30 tablet 2   prochlorperazine (COMPAZINE) 10 MG  tablet Take 1 tablet (10 mg total) by mouth every 8 (eight) hours as needed for nausea or vomiting. 30 tablet 2   VERZENIO 100 MG tablet TAKE 1 TABLET BY MOUTH TWICE DAILY 56 tablet 0   No current facility-administered medications for this visit.    PHYSICAL EXAMINATION: ECOG PERFORMANCE STATUS: {CHL ONC ECOG PS:[id number]}  There were no vitals filed for this visit. There were no vitals filed for this visit.  BREAST:*** No palpable masses or nodules in either right or left breasts. No palpable axillary supraclavicular or infraclavicular adenopathy no breast tenderness or nipple discharge. (exam performed in the presence of a chaperone)  LABORATORY DATA:  I have reviewed the data as listed    Latest Ref Rng & Units 03/11/2022    2:18 PM 02/11/2022    2:03 PM 01/14/2022   11:45 AM  CMP  Glucose 70 - 99 mg/dL 116  111  97   BUN 8 - 23 mg/dL 22  23  25    Creatinine 0.44 - 1.00 mg/dL 1.36  1.43  1.39   Sodium 135 - 145 mmol/L 133  137  137   Potassium 3.5 - 5.1 mmol/L 4.5  4.5  4.2   Chloride 98 - 111 mmol/L 105  106  104   CO2 22 - 32 mmol/L 21  25  25    Calcium 8.9 - 10.3 mg/dL 10.0  10.1  10.8   Total Protein 6.5 - 8.1 g/dL 7.8  8.2  8.0   Total Bilirubin 0.3 - 1.2 mg/dL 0.3  0.3  0.3   Alkaline Phos 38 - 126 U/L 102  110  105   AST 15 - 41 U/L 15  15  14    ALT 0 - 44 U/L 9  8  7      Lab Results  Component Value Date   WBC 8.4 03/11/2022   HGB 13.8 03/11/2022   HCT 39.6 03/11/2022   MCV 96.6 03/11/2022   PLT 320 03/11/2022   NEUTROABS 6.4 03/11/2022    ASSESSMENT & PLAN:  No problem-specific Assessment & Plan notes found for this encounter.    No orders of the defined types were placed in this encounter.  The patient has a good understanding of the overall plan. she agrees with it. she will call with any problems that may develop before the next visit here. Total time spent: 30 mins including face to face time and time spent for planning, charting and co-ordination  of care   Barbara Parks, [place] 03/27/22    I Gardiner Coins am scribing for Dr. Lindi Adie  ***

## 2022-03-28 ENCOUNTER — Other Ambulatory Visit: Payer: Self-pay

## 2022-03-28 ENCOUNTER — Inpatient Hospital Stay (HOSPITAL_BASED_OUTPATIENT_CLINIC_OR_DEPARTMENT_OTHER): Payer: Medicare Other | Admitting: Hematology and Oncology

## 2022-03-28 VITALS — BP 117/68 | HR 84 | Temp 97.2°F | Resp 18 | Ht 64.0 in | Wt 149.3 lb

## 2022-03-28 DIAGNOSIS — C7800 Secondary malignant neoplasm of unspecified lung: Secondary | ICD-10-CM

## 2022-03-28 DIAGNOSIS — Z170 Estrogen receptor positive status [ER+]: Secondary | ICD-10-CM

## 2022-03-28 DIAGNOSIS — Z5111 Encounter for antineoplastic chemotherapy: Secondary | ICD-10-CM | POA: Diagnosis not present

## 2022-03-28 DIAGNOSIS — C787 Secondary malignant neoplasm of liver and intrahepatic bile duct: Secondary | ICD-10-CM | POA: Diagnosis not present

## 2022-03-28 DIAGNOSIS — R911 Solitary pulmonary nodule: Secondary | ICD-10-CM | POA: Diagnosis not present

## 2022-03-28 DIAGNOSIS — C7951 Secondary malignant neoplasm of bone: Secondary | ICD-10-CM | POA: Diagnosis not present

## 2022-03-28 DIAGNOSIS — I3139 Other pericardial effusion (noninflammatory): Secondary | ICD-10-CM | POA: Diagnosis not present

## 2022-03-28 DIAGNOSIS — C50911 Malignant neoplasm of unspecified site of right female breast: Secondary | ICD-10-CM

## 2022-03-28 DIAGNOSIS — C50411 Malignant neoplasm of upper-outer quadrant of right female breast: Secondary | ICD-10-CM

## 2022-03-28 MED ORDER — ONDANSETRON HCL 8 MG PO TABS: 8.0000 mg | ORAL_TABLET | Freq: Three times a day (TID) | ORAL | 1 refills | Status: DC | PRN

## 2022-03-28 MED ORDER — PROCHLORPERAZINE MALEATE 10 MG PO TABS: 10.0000 mg | ORAL_TABLET | Freq: Four times a day (QID) | ORAL | 1 refills | Status: DC | PRN

## 2022-03-28 MED ORDER — METFORMIN HCL ER 500 MG PO TB24: 500.0000 mg | ORAL_TABLET | Freq: Every day | ORAL | 3 refills | Status: DC

## 2022-03-28 MED ORDER — LIDOCAINE-PRILOCAINE 2.5-2.5 % EX CREA: TOPICAL_CREAM | CUTANEOUS | 3 refills | Status: DC

## 2022-03-28 NOTE — Progress Notes (Signed)
START ON PATHWAY REGIMEN - Breast     A cycle is every 21 days:     Fam-trastuzumab [id number]-nxki   **Always confirm dose/schedule in your pharmacy ordering system**  Patient Characteristics: Distant Metastases or Locoregional Recurrent Disease - Unresected or Locally Advanced Unresectable Disease Progressing after Neoadjuvant and Local Therapies, ER Positive, Chemotherapy, HER2 Low, Third Line and Beyond, Prior or Contraindicated  Anthracycline and Prior Eribulin Therapeutic Status: Distant Metastases HER2 Status: Low ER Status: Positive (+) PR Status: Positive (+) Therapy Approach Indicated: Standard Chemotherapy/Endocrine Therapy Line of Therapy: Third Line and Beyond Intent of Therapy: Non-Curative / Palliative Intent, Discussed with Patient 

## 2022-03-28 NOTE — Assessment & Plan Note (Signed)
Metastatic breast cancer (pericardial effusion, pleural effusion, right middle lobe lung lesion, ER 91%, PR 90%, Ki-67 35%, HER2 negative diagnosed February 2023)  (Prior history of right mastectomy 2005, adjuvant chemo with Adriamycin Cytoxan and Taxol followed by tamoxifen)  Current treatment:  1. Ribociclib, fulvestrant, Xgeva,ribociclib discontinued 10/18/2021  2. abemaciclib started 10/27/2021 Foundation 1 in 2021: MSI stable, TMB 1, ESR 1 mutation, PTEN, FGF 10, RICTOR amplifications  Scans:  Liver MRI 01/12/2022: Progression of metastatic disease in the liver with increased number and size of hepatic metastases, widespread metastatic disease to the bones, moderate pleural effusion  Guardant360 01/14/2022: No actionable mutations. ESR 1 mutation. Liver biopsy 02/26/2022: Metastatic carcinoma consistent with breast primary. [facility] mutation testing 02/26/2022: HER2 low, ESR 1 mutation, PTEN  Treatment plan: Enhertu every 3 weeks Return to clinic to start treatment

## 2022-03-29 ENCOUNTER — Other Ambulatory Visit: Payer: Self-pay

## 2022-04-03 ENCOUNTER — Other Ambulatory Visit: Payer: Self-pay

## 2022-04-04 ENCOUNTER — Encounter (HOSPITAL_COMMUNITY): Payer: Self-pay

## 2022-04-08 ENCOUNTER — Inpatient Hospital Stay: Payer: Medicare Other

## 2022-04-08 ENCOUNTER — Inpatient Hospital Stay: Payer: Medicare Other | Attending: Oncology

## 2022-04-08 ENCOUNTER — Other Ambulatory Visit: Payer: Self-pay

## 2022-04-08 VITALS — BP 123/87 | HR 88 | Temp 98.1°F | Resp 18

## 2022-04-08 DIAGNOSIS — Z170 Estrogen receptor positive status [ER+]: Secondary | ICD-10-CM

## 2022-04-08 DIAGNOSIS — Z79899 Other long term (current) drug therapy: Secondary | ICD-10-CM | POA: Insufficient documentation

## 2022-04-08 DIAGNOSIS — C7951 Secondary malignant neoplasm of bone: Secondary | ICD-10-CM | POA: Diagnosis not present

## 2022-04-08 DIAGNOSIS — Z5111 Encounter for antineoplastic chemotherapy: Secondary | ICD-10-CM | POA: Insufficient documentation

## 2022-04-08 DIAGNOSIS — C50411 Malignant neoplasm of upper-outer quadrant of right female breast: Secondary | ICD-10-CM | POA: Diagnosis not present

## 2022-04-08 DIAGNOSIS — C787 Secondary malignant neoplasm of liver and intrahepatic bile duct: Secondary | ICD-10-CM | POA: Insufficient documentation

## 2022-04-08 DIAGNOSIS — C50911 Malignant neoplasm of unspecified site of right female breast: Secondary | ICD-10-CM

## 2022-04-08 LAB — CMP (CANCER CENTER ONLY)
ALT: 8 U/L (ref 0–44)
AST: 16 U/L (ref 15–41)
Albumin: 4.1 g/dL (ref 3.5–5.0)
Alkaline Phosphatase: 103 U/L (ref 38–126)
Anion gap: 6 (ref 5–15)
BUN: 22 mg/dL (ref 8–23)
CO2: 26 mmol/L (ref 22–32)
Calcium: 10.5 mg/dL — ABNORMAL HIGH (ref 8.9–10.3)
Chloride: 103 mmol/L (ref 98–111)
Creatinine: 1.32 mg/dL — ABNORMAL HIGH (ref 0.44–1.00)
GFR, Estimated: 43 mL/min — ABNORMAL LOW (ref >60)
Glucose, Bld: 116 mg/dL — ABNORMAL HIGH (ref 70–99)
Potassium: 4.5 mmol/L (ref 3.5–5.1)
Sodium: 135 mmol/L (ref 135–145)
Total Bilirubin: 0.2 mg/dL — ABNORMAL LOW (ref 0.3–1.2)
Total Protein: 7.7 g/dL (ref 6.5–8.1)

## 2022-04-08 LAB — CBC WITH DIFFERENTIAL (CANCER CENTER ONLY)
Abs Immature Granulocytes: 0.07 10*3/uL (ref 0.00–0.07)
Basophils Absolute: 0.1 10*3/uL (ref 0.0–0.1)
Basophils Relative: 2 %
Eosinophils Absolute: 0.1 10*3/uL (ref 0.0–0.5)
Eosinophils Relative: 1 %
HCT: 39.7 % (ref 36.0–46.0)
Hemoglobin: 13.5 g/dL (ref 12.0–15.0)
Immature Granulocytes: 1 %
Lymphocytes Relative: 9 %
Lymphs Abs: 0.7 10*3/uL (ref 0.7–4.0)
MCH: 33.6 pg (ref 26.0–34.0)
MCHC: 34 g/dL (ref 30.0–36.0)
MCV: 98.8 fL (ref 80.0–100.0)
Monocytes Absolute: 0.8 10*3/uL (ref 0.1–1.0)
Monocytes Relative: 10 %
Neutro Abs: 6.5 10*3/uL (ref 1.7–7.7)
Neutrophils Relative %: 77 %
Platelet Count: 278 10*3/uL (ref 150–400)
RBC: 4.02 MIL/uL (ref 3.87–5.11)
RDW: 13.6 % (ref 11.5–15.5)
WBC Count: 8.3 10*3/uL (ref 4.0–10.5)
nRBC: 0 % (ref 0.0–0.2)

## 2022-04-08 MED ORDER — DENOSUMAB 120 MG/1.7ML ~~LOC~~ SOLN
120.0000 mg | Freq: Once | SUBCUTANEOUS | Status: AC
  Administered 2022-04-08: 120 mg via SUBCUTANEOUS

## 2022-04-08 MED ORDER — FULVESTRANT 250 MG/5ML IM SOSY
500.0000 mg | PREFILLED_SYRINGE | Freq: Once | INTRAMUSCULAR | Status: AC
  Administered 2022-04-08: 500 mg via INTRAMUSCULAR

## 2022-04-09 LAB — CANCER ANTIGEN 27.29: CA 27.29: 896.8 U/mL — ABNORMAL HIGH (ref 0.0–38.6)

## 2022-04-22 ENCOUNTER — Other Ambulatory Visit: Payer: Self-pay | Admitting: Internal Medicine

## 2022-04-22 ENCOUNTER — Other Ambulatory Visit: Payer: Self-pay

## 2022-04-22 ENCOUNTER — Other Ambulatory Visit (HOSPITAL_COMMUNITY): Payer: Medicare Other

## 2022-04-22 DIAGNOSIS — C50911 Malignant neoplasm of unspecified site of right female breast: Secondary | ICD-10-CM

## 2022-04-23 ENCOUNTER — Ambulatory Visit (HOSPITAL_COMMUNITY)
Admission: RE | Admit: 2022-04-23 | Discharge: 2022-04-23 | Disposition: A | Discharge status: Home / Self Care | Payer: Medicare Other | Source: Ambulatory Visit

## 2022-04-23 ENCOUNTER — Ambulatory Visit (HOSPITAL_COMMUNITY)
Admission: RE | Admit: 2022-04-23 | Discharge: 2022-04-23 | Disposition: A | Discharge status: Home / Self Care | Payer: Medicare Other | Source: Ambulatory Visit | Attending: Hematology and Oncology | Admitting: Hematology and Oncology

## 2022-04-23 ENCOUNTER — Other Ambulatory Visit: Payer: Self-pay

## 2022-04-23 ENCOUNTER — Encounter (HOSPITAL_COMMUNITY): Payer: Self-pay

## 2022-04-23 DIAGNOSIS — Z170 Estrogen receptor positive status [ER+]: Secondary | ICD-10-CM | POA: Insufficient documentation

## 2022-04-23 DIAGNOSIS — Z7984 Long term (current) use of oral hypoglycemic drugs: Secondary | ICD-10-CM | POA: Insufficient documentation

## 2022-04-23 DIAGNOSIS — C50411 Malignant neoplasm of upper-outer quadrant of right female breast: Secondary | ICD-10-CM | POA: Diagnosis not present

## 2022-04-23 DIAGNOSIS — Z452 Encounter for adjustment and management of vascular access device: Secondary | ICD-10-CM | POA: Diagnosis not present

## 2022-04-23 DIAGNOSIS — C50911 Malignant neoplasm of unspecified site of right female breast: Secondary | ICD-10-CM

## 2022-04-23 DIAGNOSIS — C50919 Malignant neoplasm of unspecified site of unspecified female breast: Secondary | ICD-10-CM | POA: Diagnosis not present

## 2022-04-23 HISTORY — PX: IR IMAGING GUIDED PORT INSERTION: IMG5740

## 2022-04-23 LAB — GLUCOSE, CAPILLARY: Glucose-Capillary: 111 mg/dL — ABNORMAL HIGH (ref 70–99)

## 2022-04-23 MED ORDER — FENTANYL CITRATE (PF) 100 MCG/2ML IJ SOLN
INTRAMUSCULAR | Status: AC | PRN
  Administered 2022-04-23 (×2): 50 ug via INTRAVENOUS

## 2022-04-23 MED ORDER — SODIUM CHLORIDE 0.9 % IV SOLN: INTRAVENOUS | Status: DC

## 2022-04-23 MED ORDER — MIDAZOLAM HCL 2 MG/2ML IJ SOLN
INTRAMUSCULAR | Status: AC | PRN
  Administered 2022-04-23 (×2): .5 mg via INTRAVENOUS
  Administered 2022-04-23: 1 mg via INTRAVENOUS

## 2022-04-23 MED ORDER — MIDAZOLAM HCL 2 MG/2ML IJ SOLN
INTRAMUSCULAR | Status: AC
  Filled 2022-04-23: qty 4

## 2022-04-23 MED ORDER — LIDOCAINE-EPINEPHRINE 1 %-1:100000 IJ SOLN
INTRAMUSCULAR | Status: AC | PRN
  Administered 2022-04-23: 20 mL

## 2022-04-23 MED ORDER — FENTANYL CITRATE (PF) 100 MCG/2ML IJ SOLN
INTRAMUSCULAR | Status: AC
  Filled 2022-04-23: qty 2

## 2022-04-23 MED ORDER — HEPARIN SOD (PORK) LOCK FLUSH 100 UNIT/ML IV SOLN
INTRAVENOUS | Status: AC
  Filled 2022-04-23: qty 5

## 2022-04-23 MED ORDER — LIDOCAINE-EPINEPHRINE 1 %-1:100000 IJ SOLN
INTRAMUSCULAR | Status: AC
  Filled 2022-04-23: qty 1

## 2022-04-23 NOTE — H&P (Signed)
Referring Physician(s): Nicholas Lose  Supervising Physician: Ruthann Cancer  Patient Status:  [facility] OP  Chief Complaint:  "I'm getting a port a cath"  Subjective: Patient known to IR service from left liver lesion biopsies in 2021 and on 02/26/2022.  She has a history of metastatic right breast carcinoma initially diagnosed in 2005.  She now has evidence of disease progression and is scheduled today for Port-A-Cath placement to assist with treatment. She has had a left chest port placed in past by surgery, removed in 2006. She denies  fever, headache, chest pain, dyspnea, cough, abdominal/back pain, nausea, vomiting or bleeding.  Past Medical History:  Diagnosis Date   Arthritis    left hip   Asthma    breast ca 2005   breast/chemo R mastectomy   Breast cancer ([place])    Dermatitis    Diabetes mellitus without complication ([place])    GERD (gastroesophageal reflux disease)    Hypercholesteremia    Metastasis to lung ([place]) dx'd 08/2011   Osteopenia due to cancer therapy 09/09/2013   Palpitations    Personal history of chemotherapy    Shortness of breath    Past Surgical History:  Procedure Laterality Date   BREAST SURGERY  2005   right   CATARACT EXTRACTION W/PHACO Left 01/18/2014   Procedure: CATARACT EXTRACTION PHACO AND INTRAOCULAR LENS PLACEMENT ([place]);  Surgeon: Elta Guadeloupe T. Gershon Crane, MD;  Location: [facility];  Service: Ophthalmology;  Laterality: Left;  CDE 15.79   CATARACT EXTRACTION W/PHACO Right 02/08/2014   Procedure: CATARACT EXTRACTION PHACO AND INTRAOCULAR LENS PLACEMENT ([facility]);  Surgeon: Elta Guadeloupe T. Gershon Crane, MD;  Location: [facility];  Service: Ophthalmology;  Laterality: Right;  CDE 4.41   CHEST TUBE INSERTION  09/11/2011   Procedure: INSERTION PLEURAL DRAINAGE CATHETER;  Surgeon: Gaye Pollack, MD;  Location: [facility];  Service: Thoracic;  Laterality: Right;   MASTECTOMY Right    PERICARDIAL WINDOW  09/11/2011   Procedure: PERICARDIAL WINDOW;  Surgeon: Gaye Pollack, MD;  Location: [facility];  Service: Thoracic;  Laterality: N/A;   REMOVAL OF PLEURAL DRAINAGE CATHETER  12/19/2011   Procedure: REMOVAL OF PLEURAL DRAINAGE CATHETER;  Surgeon: Gaye Pollack, MD;  Location: [facility];  Service: Thoracic;  Laterality: Right;  TO BE DONE IN MINOR ROOM, [place] Left 06/07/2015   Procedure: LEFT TOTAL HIP ARTHROPLASTY ANTERIOR APPROACH;  Surgeon: Gaynelle Arabian, MD;  Location: [facility];  Service: Orthopedics;  Laterality: Left;   VIDEO BRONCHOSCOPY  09/11/2011   Procedure: VIDEO BRONCHOSCOPY;  Surgeon: Gaye Pollack, MD;  Location: [facility];  Service: Thoracic;  Laterality: N/A;      Allergies: Aspirin, Latex, and Nsaids  Medications: Prior to Admission medications   Medication Sig Start Date End Date Taking? Authorizing Provider  gemfibrozil (LOPID) 600 MG tablet Take 1 tablet (600 mg total) by mouth 2 (two) times daily before a meal. 09/13/21  Yes Nicholas Lose, MD  loperamide (IMODIUM) 2 MG capsule Take 2 mg by mouth 4 (four) times daily as needed for diarrhea or loose stools. Reported on 11/30/2015   Yes [provider]  metFORMIN (GLUCOPHAGE-XR) 500 MG 24 hr tablet Take 1 tablet (500 mg total) by mouth daily with supper. Followed by [facility] 03/28/22  Yes Nicholas Lose, MD  omeprazole (PRILOSEC OTC) 20 MG tablet Take 20 mg by mouth daily as needed (acid reflux).   Yes [provider]  VERZENIO 100 MG tablet  TAKE 1 TABLET BY MOUTH TWICE DAILY 03/19/22  Yes Nicholas Lose, MD  cholecalciferol (VITAMIN D3) 25 MCG (1000 UT) tablet Take 2 tablets (2,000 Units total) by mouth daily. 09/03/18   Magrinat, Virgie Dad, MD  lidocaine-prilocaine (EMLA) cream Apply to affected area once 03/28/22   Nicholas Lose, MD  ondansetron (ZOFRAN) 8 MG tablet Take 1 tablet (8 mg total) by mouth every 8 (eight) hours as needed for nausea or vomiting. 12/31/21   Nicholas Lose, MD  ondansetron (ZOFRAN) 8 MG tablet Take 1 tablet (8 mg total) by mouth  every 8 (eight) hours as needed for nausea or vomiting. Start on the third day after chemotherapy. 03/28/22   Nicholas Lose, MD  prochlorperazine (COMPAZINE) 10 MG tablet Take 1 tablet (10 mg total) by mouth every 8 (eight) hours as needed for nausea or vomiting. 12/31/21   Nicholas Lose, MD  prochlorperazine (COMPAZINE) 10 MG tablet Take 1 tablet (10 mg total) by mouth every 6 (six) hours as needed for nausea or vomiting. 03/28/22   Nicholas Lose, MD     Vital Signs: BP 105/67   Pulse (!) 102   Temp 97.7 F (36.5 C) (Oral)   Resp 16   Ht '5\' 4"'$  (1.626 m)   Wt 145 lb (65.8 kg)   SpO2 97%   BMI 24.89 kg/m   Physical Exam awake, alert.  Chest with slightly diminished breath sounds right base, left clear.  Heart with regular rate and rhythm.  Abdomen soft, positive bowel sounds, nontender.  No significant lower extremity edema.  Imaging: No results found.  Labs:  CBC: Recent Labs    02/11/22 1403 02/26/22 1123 03/11/22 1418 04/08/22 1415  WBC 8.7 10.0 8.4 8.3  HGB 14.1 14.2 13.8 13.5  HCT 41.7 42.3 39.6 39.7  PLT 278 338 320 278    COAGS: Recent Labs    02/26/22 1123  INR 1.0    BMP: Recent Labs    01/14/22 1145 02/11/22 1403 03/11/22 1418 04/08/22 1415  NA 137 137 133* 135  K 4.2 4.5 4.5 4.5  CL 104 106 105 103  CO2 25 25 21* 26  GLUCOSE 97 111* 116* 116*  BUN 25* '23 22 22  '$ CALCIUM 10.8* 10.1 10.0 10.5*  CREATININE 1.39* 1.43* 1.36* 1.32*  GFRNONAA 41* 39* 42* 43*    LIVER FUNCTION TESTS: Recent Labs    01/14/22 1145 02/11/22 1403 03/11/22 1418 04/08/22 1415  BILITOT 0.3 0.3 0.3 0.2*  AST 14* '15 15 16  '$ ALT '7 8 9 8  '$ ALKPHOS 105 110 102 103  PROT 8.0 8.2* 7.8 7.7  ALBUMIN 4.4 4.5 4.4 4.1    Assessment and Plan: Patient known to IR service from left liver lesion biopsies in 2021 and on 02/26/2022.  She has a history of metastatic right breast carcinoma initially diagnosed in 2005.  She now has evidence of disease progression and is scheduled today  for Port-A-Cath placement to assist with treatment. Past medical history also significant for asthma, diabetes, GERD, hypercholesterolemia. Details/risks and benefits of image guided port-a-catheter placement was discussed with the patient including, but not limited to bleeding, infection, pneumothorax, or fibrin sheath development and need for additional procedures.  All of the patient's questions were answered, patient is agreeable to proceed. Consent signed and in chart.    Electronically Signed: D. Rowe Robert, PA-C 04/23/2022, 11:41 AM   I spent a total of 15 Minutes at the the patient's bedside AND on the patient's hospital floor or unit, greater  than 50% of which was counseling/coordinating care for port a cath placement

## 2022-04-23 NOTE — Procedures (Signed)
Interventional Radiology Procedure Note ° °Procedure: Single Lumen Power Port Placement   ° °Access:  Right internal jugular vein ° °Findings: Catheter tip positioned at cavoatrial junction. Port is ready for immediate use.  ° °Complications: None ° °EBL: < 10 mL ° °Recommendations:  °- Ok to shower in 24 hours °- Do not submerge for 7 days °- Routine line care  ° ° °Angeliki Mates, MD ° ° ° °

## 2022-04-23 NOTE — Discharge Instructions (Signed)
Moderate Conscious Sedation, Adult, Care After This sheet gives you information about how to care for yourself after your procedure. Your health care provider may also give you more specific instructions. If you have problems or questions, contact your health care provider. What can I expect after the procedure? After the procedure, it is common to have: Sleepiness for several hours. Impaired judgment for several hours. Difficulty with balance. Vomiting if you eat too soon. Follow these instructions at home: For the time period you were told by your health care provider:     Rest. Do not participate in activities where you could fall or become injured. Do not drive or use machinery. Do not drink alcohol. Do not take sleeping pills or medicines that cause drowsiness. Do not make important decisions or sign legal documents. Do not take care of children on your own. Eating and drinking  Follow the diet recommended by your health care provider. Drink enough fluid to keep your urine pale yellow. If you vomit: Drink water, juice, or soup when you can drink without vomiting. Make sure you have little or no nausea before eating solid foods. General instructions Take over-the-counter and prescription medicines only as told by your health care provider. Have a responsible adult stay with you for the time you are told. It is important to have someone help care for you until you are awake and alert. Do not smoke. Keep all follow-up visits as told by your health care provider. This is important. Contact a health care provider if: You are still sleepy or having trouble with balance after 24 hours. You feel light-headed. You keep feeling nauseous or you keep vomiting. You develop a rash. You have a fever. You have redness or swelling around the IV site. Get help right away if: You have trouble breathing. You have new-onset confusion at home. Summary After the procedure, it is common to  feel sleepy, have impaired judgment, or feel nauseous if you eat too soon. Rest after you get home. Know the things you should not do after the procedure. Follow the diet recommended by your health care provider and drink enough fluid to keep your urine pale yellow. Get help right away if you have trouble breathing or new-onset confusion at home. This information is not intended to replace advice given to you by your health care provider. Make sure you discuss any questions you have with your health care provider. Document Revised: 11/12/2019 Document Reviewed: 06/10/2019 Elsevier Patient Education  [place] Insertion, Care After The following information offers guidance on how to care for yourself after your procedure. Your health care provider may also give you more specific instructions. If you have problems or questions, contact your health care provider.  Urgent needs- Interventional Radiology clinic- [phone] Wound- May remove dressing in 24-48 hours and shower. Otherwise keep site clean and dry. May replace dressing with clean bandaids as needed.  Your Provider should set up monthly appointments for Port flush.  What can I expect after the procedure? After the procedure, it is common to have: Discomfort at the port insertion site. Bruising on the skin over the port. This should improve over 3-4 days. Follow these instructions at home: [facility] care After your port is placed, you will get a manufacturer's information card. The card has information about your port. Keep this card with you at all times. Take care of the port as told by your health care provider. Ask your health  care provider if you or a family member can get training for taking care of the port at home. A home health care nurse will be be available to help care for the port. Make sure to remember what type of port you have. Incision care     Follow instructions from your health care  provider about how to take care of your port insertion site. Make sure you: Wash your hands with soap and water for at least 20 seconds before and after you change your bandage (dressing). If soap and water are not available, use hand sanitizer. Change your dressing as told by your health care provider. Leave stitches (sutures), skin glue, or adhesive strips in place. These skin closures may need to stay in place for 2 weeks or longer. If adhesive strip edges start to loosen and curl up, you may trim the loose edges. Do not remove adhesive strips completely unless your health care provider tells you to do that. Check your port insertion site every day for signs of infection. Check for: Redness, swelling, or pain. Fluid or blood. Warmth. Pus or a bad smell. Activity Return to your normal activities as told by your health care provider. Ask your health care provider what activities are safe for you. You may have to avoid lifting. Ask your health care provider how much you can safely lift. General instructions Take over-the-counter and prescription medicines only as told by your health care provider. Do not take baths, swim, or use a hot tub until site healed. Ask your health care provider if you may take showers. You may only be allowed to take sponge baths. If you were given a sedative during the procedure, it can affect you for several hours. Do not drive or operate machinery until your health care provider says that it is safe. Wear a medical alert bracelet in case of an emergency. This will tell any health care providers that you have a port. Keep all follow-up visits. This is important. Contact a health care provider if: You cannot flush your port with saline as directed, or you cannot draw blood from the port. You have a fever or chills. You have redness, swelling, or pain around your port insertion site. You have fluid or blood coming from your port insertion site. Your port insertion  site feels warm to the touch. You have pus or a bad smell coming from the port insertion site. Get help right away if: You have chest pain or shortness of breath. You have bleeding from your port that you cannot control. These symptoms may be an emergency. Get help right away. Call 911. Do not wait to see if the symptoms will go away. Do not drive yourself to the hospital. Summary Take care of the port as told by your health care provider. Keep the manufacturer's information card with you at all times. Change your dressing as told by your health care provider. Contact a health care provider if you have a fever or chills or if you have redness, swelling, or pain around your port insertion site. Keep all follow-up visits. This information is not intended to replace advice given to you by your health care provider. Make sure you discuss any questions you have with your health care provider. Document Revised: 01/16/2021 Document Reviewed: 01/16/2021 Elsevier Patient Education  [place].       Interventional Radiology clinic [phone]   You may remove your dressing and shower tomorrow afternoon  DO NOT use EMLA cream  for 2 weeks after port placement as the cream will remove surgical glue on your incision.

## 2022-04-25 ENCOUNTER — Other Ambulatory Visit: Payer: Self-pay | Admitting: *Deleted

## 2022-04-25 DIAGNOSIS — Z5181 Encounter for therapeutic drug level monitoring: Secondary | ICD-10-CM

## 2022-04-29 ENCOUNTER — Ambulatory Visit (HOSPITAL_COMMUNITY)
Admission: RE | Admit: 2022-04-29 | Discharge: 2022-04-29 | Disposition: A | Discharge status: Home / Self Care | Payer: Medicare Other | Source: Ambulatory Visit | Attending: Hematology and Oncology | Admitting: Hematology and Oncology

## 2022-04-29 ENCOUNTER — Inpatient Hospital Stay: Payer: Medicare Other | Attending: Oncology

## 2022-04-29 DIAGNOSIS — C7951 Secondary malignant neoplasm of bone: Secondary | ICD-10-CM | POA: Insufficient documentation

## 2022-04-29 DIAGNOSIS — R000 Tachycardia, unspecified: Secondary | ICD-10-CM | POA: Diagnosis not present

## 2022-04-29 DIAGNOSIS — Z79899 Other long term (current) drug therapy: Secondary | ICD-10-CM

## 2022-04-29 DIAGNOSIS — Z0189 Encounter for other specified special examinations: Secondary | ICD-10-CM

## 2022-04-29 DIAGNOSIS — Z859 Personal history of malignant neoplasm, unspecified: Secondary | ICD-10-CM | POA: Diagnosis not present

## 2022-04-29 DIAGNOSIS — Z5181 Encounter for therapeutic drug level monitoring: Secondary | ICD-10-CM

## 2022-04-29 DIAGNOSIS — Z5111 Encounter for antineoplastic chemotherapy: Secondary | ICD-10-CM | POA: Insufficient documentation

## 2022-04-29 DIAGNOSIS — C50411 Malignant neoplasm of upper-outer quadrant of right female breast: Secondary | ICD-10-CM | POA: Insufficient documentation

## 2022-04-29 DIAGNOSIS — C787 Secondary malignant neoplasm of liver and intrahepatic bile duct: Secondary | ICD-10-CM | POA: Insufficient documentation

## 2022-04-29 DIAGNOSIS — C7800 Secondary malignant neoplasm of unspecified lung: Secondary | ICD-10-CM | POA: Insufficient documentation

## 2022-04-29 LAB — ECHOCARDIOGRAM COMPLETE
Area-P 1/2: 3.7 cm2
S' Lateral: 2.6 cm

## 2022-04-29 NOTE — Progress Notes (Signed)
  Echocardiogram 2D Echocardiogram has been performed.  Darlina Sicilian M 04/29/2022, 1:50 PM

## 2022-04-30 NOTE — Progress Notes (Signed)
Patient Care Team: Saintclair Halsted, FNP as PCP - General (Family Medicine) Jerline Pain, MD (Cardiology) Gaye Pollack, MD (Cardiothoracic Surgery) Raina Mina, RPH-CPP (Pharmacist) Renaldo Harrison, MD as Referring Physician (Radiation Oncology) Nicholas Lose, MD as Consulting Physician (Hematology and Oncology)  DIAGNOSIS: No diagnosis found.  SUMMARY OF ONCOLOGIC HISTORY: Oncology History  Carcinoma of right breast metastatic to lung ([place])  10/10/2011 Initial Diagnosis   Carcinoma of right breast metastatic to lung ([place])   05/02/2022 -  Chemotherapy   Patient is on Treatment Plan : BREAST METASTATIC Fam-Trastuzumab Deruxtecan-nxki (Enhertu) (5.4) q21d     Malignant neoplasm of upper-outer quadrant of right breast in female, estrogen receptor positive ([place])  03/22/2004 Initial Diagnosis   right mastectomy and sentinel lymph node sampling 03/22/2004 for a right upper-outer quadrant pT2 pN1, stage IIB invasive ductal carcinoma with lobular features, grade 2, estrogen receptor and progesterone receptor positive, HER-2 negative, with an MIB-1 of 9%   05/2004 - 09/18/2004 Chemotherapy   adjuvant chemotherapy with dose dense doxorubicin and cyclophosphamide x4 cycles (first cycle delayed one week) followed by dose dense paclitaxel x4 was completed 09/18/2004   09/2004 - 2009 Anti-estrogen oral therapy   tamoxifen started March 2006, discontinued 2009   08/2011 Relapse/Recurrence   Metastatic disease:large pericardial effusion, large right pleural effusion and possible right middle lobe bronchial obstruction  status post pericardial window placement, fiberoptic bronchoscopy and right Pleurx placement 09/11/2011, with biopsy of the bronchus intermedius and pericardium positive for metastatic breast cancer, ER 91%, PR 100% p with an MIB-1 of 35%, and no HER-2 amplification, ratio being 1.37   10/10/2011 - 07/2019 Anti-estrogen oral therapy   BOLERO-4 trial 10/10/2011, receiving  letrozole and everolimus (stopped for progression)   08/16/2019 Miscellaneous   foundation 1 requested on liver biopsy from 08/16/2019 shows a stable microsatellite status with 1 mutation/MB, amplification of C11orf30, FGF10 and RICTOR, with mutations in ESR1 and PTEN   08/16/2019 Miscellaneous   Liver biopsy: Metastatic carcinoma to the liver consistent with breast primary, ER 100%, PR 100%, Ki-67 5%, HER2 2+ by IHC, FISH HER2 negative   08/24/2019 -  Anti-estrogen oral therapy   fulvestrant started 08/24/2019, repeated every 4 weeks Palbociclib 125 mg daily 21 days on 7 days off started 08/25/2019 Switched to ribociclib s starting 08/22/2020   02/28/2021 -  Radiation Therapy   palliative radiation (Dr. Lynnette Caffey in [place]) 02/28/2021 to right hip area   10/27/2021 -  Anti-estrogen oral therapy   Abemaciclib with fulvestrant   01/14/2022 Miscellaneous   Guardant360: ESR 1 mutation (no other mutations) MSI high was not detected, PTEN, T p53, EGFR amplification, RB1 mutations detected without any approved treatment options     CHIEF COMPLIANT:  Follow-up Metastatic carcinoma     INTERVAL HISTORY: Barbara Parks is a 71 year old above-mentioned with metastatic breast cancer who was switched from ribociclib to abemaciclib. She presents to the clinic today    ALLERGIES:  is allergic to aspirin, latex, and nsaids.  MEDICATIONS:  Current Outpatient Medications  Medication Sig Dispense Refill   cholecalciferol (VITAMIN D3) 25 MCG (1000 UT) tablet Take 2 tablets (2,000 Units total) by mouth daily. 180 tablet 4   gemfibrozil (LOPID) 600 MG tablet Take 1 tablet (600 mg total) by mouth 2 (two) times daily before a meal. 180 tablet 2   lidocaine-prilocaine (EMLA) cream Apply to affected area once 30 g 3   loperamide (IMODIUM) 2 MG capsule Take 2 mg by mouth 4 (four) times  daily as needed for diarrhea or loose stools. Reported on 11/30/2015     metFORMIN (GLUCOPHAGE-XR) 500 MG 24 hr tablet Take 1  tablet (500 mg total) by mouth daily with supper. Followed by [facility]  3   omeprazole (PRILOSEC OTC) 20 MG tablet Take 20 mg by mouth daily as needed (acid reflux).     ondansetron (ZOFRAN) 8 MG tablet Take 1 tablet (8 mg total) by mouth every 8 (eight) hours as needed for nausea or vomiting. 30 tablet 2   ondansetron (ZOFRAN) 8 MG tablet Take 1 tablet (8 mg total) by mouth every 8 (eight) hours as needed for nausea or vomiting. Start on the third day after chemotherapy. 30 tablet 1   prochlorperazine (COMPAZINE) 10 MG tablet Take 1 tablet (10 mg total) by mouth every 8 (eight) hours as needed for nausea or vomiting. 30 tablet 2   prochlorperazine (COMPAZINE) 10 MG tablet Take 1 tablet (10 mg total) by mouth every 6 (six) hours as needed for nausea or vomiting. 30 tablet 1   VERZENIO 100 MG tablet TAKE 1 TABLET BY MOUTH TWICE DAILY 56 tablet 0   No current facility-administered medications for this visit.    PHYSICAL EXAMINATION: ECOG PERFORMANCE STATUS: {CHL ONC ECOG [phone]}  There were no vitals filed for this visit. There were no vitals filed for this visit.  BREAST:*** No palpable masses or nodules in either right or left breasts. No palpable axillary supraclavicular or infraclavicular adenopathy no breast tenderness or nipple discharge. (exam performed in the presence of a chaperone)  LABORATORY DATA:  I have reviewed the data as listed    Latest Ref Rng & Units 04/08/2022    2:15 PM 03/11/2022    2:18 PM 02/11/2022    2:03 PM  CMP  Glucose 70 - 99 mg/dL 116  116  111   BUN 8 - 23 mg/dL 22  22  23    Creatinine 0.44 - 1.00 mg/dL 1.32  1.36  1.43   Sodium 135 - 145 mmol/L 135  133  137   Potassium 3.5 - 5.1 mmol/L 4.5  4.5  4.5   Chloride 98 - 111 mmol/L 103  105  106   CO2 22 - 32 mmol/L 26  21  25    Calcium 8.9 - 10.3 mg/dL 10.5  10.0  10.1   Total Protein 6.5 - 8.1 g/dL 7.7  7.8  8.2   Total Bilirubin 0.3 - 1.2 mg/dL 0.2  0.3  0.3   Alkaline Phos 38 - 126 U/L  103  102  110   AST 15 - 41 U/L 16  15  15    ALT 0 - 44 U/L 8  9  8      Lab Results  Component Value Date   WBC 8.3 04/08/2022   HGB 13.5 04/08/2022   HCT 39.7 04/08/2022   MCV 98.8 04/08/2022   PLT 278 04/08/2022   NEUTROABS 6.5 04/08/2022    ASSESSMENT & PLAN:  No problem-specific Assessment & Plan notes found for this encounter.    No orders of the defined types were placed in this encounter.  The patient has a good understanding of the overall plan. she agrees with it. she will call with any problems that may develop before the next visit here. Total time spent: 30 mins including face to face time and time spent for planning, charting and co-ordination of care   Suzzette Righter, [place] 04/30/22    I Nessa Ramaker, Carnita Golob am scribing  for Dr. Lindi Adie  ***

## 2022-05-01 MED FILL — Dexamethasone Sodium Phosphate Inj 100 MG/10ML: INTRAMUSCULAR | Qty: 1 | Status: AC

## 2022-05-02 ENCOUNTER — Other Ambulatory Visit: Payer: Self-pay | Admitting: Hematology and Oncology

## 2022-05-02 ENCOUNTER — Inpatient Hospital Stay: Payer: Medicare Other

## 2022-05-02 ENCOUNTER — Other Ambulatory Visit: Payer: Self-pay

## 2022-05-02 ENCOUNTER — Inpatient Hospital Stay (HOSPITAL_BASED_OUTPATIENT_CLINIC_OR_DEPARTMENT_OTHER): Payer: Medicare Other | Admitting: Hematology and Oncology

## 2022-05-02 VITALS — BP 122/69 | HR 97 | Resp 16

## 2022-05-02 DIAGNOSIS — C50411 Malignant neoplasm of upper-outer quadrant of right female breast: Secondary | ICD-10-CM | POA: Diagnosis not present

## 2022-05-02 DIAGNOSIS — C50911 Malignant neoplasm of unspecified site of right female breast: Secondary | ICD-10-CM

## 2022-05-02 DIAGNOSIS — Z170 Estrogen receptor positive status [ER+]: Secondary | ICD-10-CM

## 2022-05-02 DIAGNOSIS — Z79899 Other long term (current) drug therapy: Secondary | ICD-10-CM | POA: Diagnosis not present

## 2022-05-02 DIAGNOSIS — C787 Secondary malignant neoplasm of liver and intrahepatic bile duct: Secondary | ICD-10-CM | POA: Diagnosis not present

## 2022-05-02 DIAGNOSIS — C7800 Secondary malignant neoplasm of unspecified lung: Secondary | ICD-10-CM | POA: Diagnosis not present

## 2022-05-02 DIAGNOSIS — C7951 Secondary malignant neoplasm of bone: Secondary | ICD-10-CM | POA: Diagnosis not present

## 2022-05-02 DIAGNOSIS — Z5111 Encounter for antineoplastic chemotherapy: Secondary | ICD-10-CM | POA: Diagnosis not present

## 2022-05-02 LAB — CBC WITH DIFFERENTIAL (CANCER CENTER ONLY)
Abs Immature Granulocytes: 0.04 10*3/uL (ref 0.00–0.07)
Basophils Absolute: 0.2 10*3/uL — ABNORMAL HIGH (ref 0.0–0.1)
Basophils Relative: 2 %
Eosinophils Absolute: 0 10*3/uL (ref 0.0–0.5)
Eosinophils Relative: 0 %
HCT: 39.2 % (ref 36.0–46.0)
Hemoglobin: 13.5 g/dL (ref 12.0–15.0)
Immature Granulocytes: 1 %
Lymphocytes Relative: 8 %
Lymphs Abs: 0.7 10*3/uL (ref 0.7–4.0)
MCH: 33.5 pg (ref 26.0–34.0)
MCHC: 34.4 g/dL (ref 30.0–36.0)
MCV: 97.3 fL (ref 80.0–100.0)
Monocytes Absolute: 0.8 10*3/uL (ref 0.1–1.0)
Monocytes Relative: 9 %
Neutro Abs: 7.2 10*3/uL (ref 1.7–7.7)
Neutrophils Relative %: 80 %
Platelet Count: 297 10*3/uL (ref 150–400)
RBC: 4.03 MIL/uL (ref 3.87–5.11)
RDW: 13.8 % (ref 11.5–15.5)
WBC Count: 8.9 10*3/uL (ref 4.0–10.5)
nRBC: 0 % (ref 0.0–0.2)

## 2022-05-02 LAB — CMP (CANCER CENTER ONLY)
ALT: 7 U/L (ref 0–44)
AST: 15 U/L (ref 15–41)
Albumin: 4.3 g/dL (ref 3.5–5.0)
Alkaline Phosphatase: 94 U/L (ref 38–126)
Anion gap: 8 (ref 5–15)
BUN: 22 mg/dL (ref 8–23)
CO2: 22 mmol/L (ref 22–32)
Calcium: 10.1 mg/dL (ref 8.9–10.3)
Chloride: 105 mmol/L (ref 98–111)
Creatinine: 1.31 mg/dL — ABNORMAL HIGH (ref 0.44–1.00)
GFR, Estimated: 44 mL/min — ABNORMAL LOW (ref >60)
Glucose, Bld: 108 mg/dL — ABNORMAL HIGH (ref 70–99)
Potassium: 4.5 mmol/L (ref 3.5–5.1)
Sodium: 135 mmol/L (ref 135–145)
Total Bilirubin: 0.3 mg/dL (ref 0.3–1.2)
Total Protein: 8.2 g/dL — ABNORMAL HIGH (ref 6.5–8.1)

## 2022-05-02 MED ORDER — DEXTROSE 5 % IV SOLN: Freq: Once | INTRAVENOUS | Status: AC

## 2022-05-02 MED ORDER — DIPHENHYDRAMINE HCL 25 MG PO CAPS
50.0000 mg | ORAL_CAPSULE | Freq: Once | ORAL | Status: AC
  Administered 2022-05-02: 50 mg via ORAL
  Filled 2022-05-02: qty 2

## 2022-05-02 MED ORDER — ACETAMINOPHEN 325 MG PO TABS
650.0000 mg | ORAL_TABLET | Freq: Once | ORAL | Status: AC
  Administered 2022-05-02: 650 mg via ORAL
  Filled 2022-05-02: qty 2

## 2022-05-02 MED ORDER — HEPARIN SOD (PORK) LOCK FLUSH 100 UNIT/ML IV SOLN
500.0000 [IU] | Freq: Once | INTRAVENOUS | Status: AC | PRN
  Administered 2022-05-02: 500 [IU]

## 2022-05-02 MED ORDER — PALONOSETRON HCL INJECTION 0.25 MG/5ML
0.2500 mg | Freq: Once | INTRAVENOUS | Status: AC
  Administered 2022-05-02: 0.25 mg via INTRAVENOUS
  Filled 2022-05-02: qty 5

## 2022-05-02 MED ORDER — FAM-TRASTUZUMAB DERUXTECAN-NXKI CHEMO 100 MG IV SOLR
4.4200 mg/kg | Freq: Once | INTRAVENOUS | Status: AC
  Administered 2022-05-02: 300 mg via INTRAVENOUS
  Filled 2022-05-02: qty 15

## 2022-05-02 MED ORDER — SODIUM CHLORIDE 0.9% FLUSH
10.0000 mL | INTRAVENOUS | Status: DC | PRN
  Administered 2022-05-02: 10 mL

## 2022-05-02 MED ORDER — SODIUM CHLORIDE 0.9 % IV SOLN
10.0000 mg | Freq: Once | INTRAVENOUS | Status: AC
  Administered 2022-05-02: 10 mg via INTRAVENOUS
  Filled 2022-05-02: qty 10

## 2022-05-02 NOTE — Assessment & Plan Note (Signed)
Metastatic breast cancer (pericardial effusion, pleural effusion, right middle lobe lung lesion, ER 91%, PR 90%, Ki-67 35%, HER2 negative diagnosed February 2023)  (Prior history of right mastectomy 2005, adjuvant chemo with Adriamycin Cytoxan and Taxol followed by tamoxifen)  Current treatment:  1. Ribociclib, fulvestrant, Xgeva,ribociclib discontinued 10/18/2021  2. abemaciclib started 10/27/2021 Foundation 1 in 2021: MSI stable, TMB 1, ESR 1 mutation, PTEN, FGF 10, RICTOR amplifications  Bone scan: Uptake in multiple sites of bone metastatic disease corresponding to site seen on previous exam as well as T10  Liver MRI 01/12/2022: Progression of metastatic disease in the liver with increased number and size of hepatic metastases, widespread metastatic disease to the bones, moderate pleural effusion  Guardant360 01/14/2022: No actionable mutations. ESR 1 mutation. Liver biopsy 02/26/2022: Metastatic carcinoma consistent with breast primary. Caris mutation testing 02/26/2022: HER2 low, ESR 1 mutation, PTEN  Treatment plan: Enhertu every 3 weeks, today cycle 1 Return to clinic every 3 weeks for Enhertu

## 2022-05-02 NOTE — Patient Instructions (Signed)
[facility] MEDICAL ONCOLOGY   Discharge Instructions: Thank you for choosing [facility] to provide your oncology and hematology care.   If you have a lab appointment with the Cancer Center, please go directly to the Cancer Center and check in at the registration area.   Wear comfortable clothing and clothing appropriate for easy access to any Portacath or PICC line.   We strive to give you quality time with your provider. You may need to reschedule your appointment if you arrive late (15 or more minutes).  Arriving late affects you and other patients whose appointments are after yours.  Also, if you miss three or more appointments without notifying the office, you may be dismissed from the clinic at the provider's discretion.      For prescription refill requests, have your pharmacy contact our office and allow 72 hours for refills to be completed.    Today you received the following chemotherapy and/or immunotherapy agents: fam-trastuzumab deruxtecan-nxki      To help prevent nausea and vomiting after your treatment, we encourage you to take your nausea medication as directed.  BELOW ARE SYMPTOMS THAT SHOULD BE REPORTED IMMEDIATELY: *FEVER GREATER THAN 100.4 F (38 C) OR HIGHER *CHILLS OR SWEATING *NAUSEA AND VOMITING THAT IS NOT CONTROLLED WITH YOUR NAUSEA MEDICATION *UNUSUAL SHORTNESS OF BREATH *UNUSUAL BRUISING OR BLEEDING *URINARY PROBLEMS (pain or burning when urinating, or frequent urination) *BOWEL PROBLEMS (unusual diarrhea, constipation, pain near the anus) TENDERNESS IN MOUTH AND THROAT WITH OR WITHOUT PRESENCE OF ULCERS (sore throat, sores in mouth, or a toothache) UNUSUAL RASH, SWELLING OR PAIN  UNUSUAL VAGINAL DISCHARGE OR ITCHING   Items with * indicate a potential emergency and should be followed up as soon as possible or go to the Emergency Department if any problems should occur.  Please show the CHEMOTHERAPY ALERT CARD or IMMUNOTHERAPY  ALERT CARD at check-in to the Emergency Department and triage nurse.  Should you have questions after your visit or need to cancel or reschedule your appointment, please contact [facility] MEDICAL ONCOLOG[facility]  Dept: [phone]  and follow the prompts.  Office hours are 8:00 a.m. to 4:30 p.m. Monday - Friday. Please note that voicemails left after 4:00 p.m. may not be returned until the following business day.  We are closed weekends and major holidays. You have access to a nurse at all times for urgent questions. Please call the main number to the clinic Dept: [phone] and follow the prompts.   For any non-urgent questions, you may also contact your provider using MyChart. We now offer e-Visits for anyone 18 and older to request care online for non-urgent symptoms. For details visit [email or web address].   Also download the MyChart app! Go to the app store, search "MyChart", open the app, select [facility], and log in with your MyChart username and password.  Masks are optional in the cancer centers. If you would like for your care team to wear a mask while they are taking care of you, please let them know. You may have one support person who is at least 71 years old accompany you for your appointments. 

## 2022-05-02 NOTE — Progress Notes (Signed)
Per Dr Lindi Adie, do not give Faslodex today.

## 2022-05-03 ENCOUNTER — Telehealth: Payer: Self-pay

## 2022-05-03 LAB — CANCER ANTIGEN 27.29: CA 27.29: 1009.1 U/mL — ABNORMAL HIGH (ref 0.0–38.6)

## 2022-05-03 NOTE — Telephone Encounter (Signed)
LM for patient that this nurse was calling to see how they were doing after their treatment. Please call back to Dr. Gudena's nurse at [phone] if they have any questions or concerns regarding the treatment. 

## 2022-05-03 NOTE — Telephone Encounter (Signed)
-----   Message from Clyda Hurdle, RN sent at 05/02/2022  4:50 PM EDT ----- Regarding: 1st Time Callback-Dr Lindi Adie 1st time fam-trastuzumab deruxtecan-nxki Dr Geralyn Flash patient. Patient had no issues during treatment.

## 2022-05-06 ENCOUNTER — Telehealth: Payer: Self-pay | Admitting: Hematology and Oncology

## 2022-05-06 NOTE — Telephone Encounter (Signed)
Scheduled appointments per WQ. Left voicemail.  

## 2022-05-07 ENCOUNTER — Other Ambulatory Visit: Payer: Self-pay

## 2022-05-08 ENCOUNTER — Other Ambulatory Visit: Payer: Self-pay

## 2022-05-14 ENCOUNTER — Other Ambulatory Visit: Payer: Self-pay | Admitting: Pharmacist

## 2022-05-15 ENCOUNTER — Telehealth: Payer: Self-pay

## 2022-05-15 NOTE — Telephone Encounter (Signed)
Attempted to call pt once again to discuss discontinuation of abemaciclib. LVM for call back.

## 2022-05-15 NOTE — Telephone Encounter (Signed)
-----   Message from Merril Abbe, LPN sent at [id number]/40/3474  2:56 PM EDT ----- Regarding: RE: Abemaciclib removed from medication list I called her about 20 min ago and left another voice mail.  ----- Message ----- From: Raina Mina, RPH-CPP Sent: 05/14/2022   2:54 PM EDT To: Merril Abbe, LPN; Raina Mina, RPH-CPP; # Subject: RE: Abemaciclib removed from medication list   Thank you Porsche. Has there been any update on whether the patient stopped the medication?  ----- Message ----- From: Merril Abbe, LPN Sent: 25/95/6387  10:13 AM EDT To: Raina Mina, RPH-CPP; # Subject: RE: Abemaciclib removed from medication list   Dr.Gudena states that she needs to stop it but not sure if pt knows to. I have called pt and left message to call back. I will attempt again shortly.  ----- Message ----- From: Raina Mina, RPH-CPP Sent: 05/14/2022   9:57 AM EDT To: Raina Mina, RPH-CPP; # Subject: Abemaciclib removed from medication list        Hello,  Based on patient starting Enhertu, I have removed abemaciclib from her active medication list. I am fairly certain patient knows from Dr. Lindi Adie not to continue abemaciclib but can we check with either Dr. Lindi Adie or the patient that this conversation took place? I did not see anything in the notes but may have overlooked it. Thank you.  Regards, John   ----------------------------------------------------- Linus Mako. Camille Bal, PharmD, BCOP, CPP

## 2022-05-16 ENCOUNTER — Telehealth: Payer: Self-pay

## 2022-05-16 ENCOUNTER — Telehealth: Payer: Self-pay | Admitting: *Deleted

## 2022-05-16 NOTE — Telephone Encounter (Signed)
Spoke with patient to confirm that patient has stopped taking abemaciclib.

## 2022-05-16 NOTE — Patient Outreach (Signed)
  Care Coordination   05/16/2022 Name: Barbara Parks MRN: [id number] DOB: July 21, 1951   Care Coordination Outreach Attempts:  An unsuccessful telephone outreach was attempted today to offer the patient information about available care coordination services as a benefit of their health plan.   Follow Up Plan:  Additional outreach attempts will be made to offer the patient care coordination information and services.   Encounter Outcome:  No Answer  Care Coordination Interventions Activated:  No   Care Coordination Interventions:  No, not indicated    Raina Mina, RN Care Management Coordinator [place] Office [phone]

## 2022-05-22 ENCOUNTER — Telehealth: Payer: Self-pay | Admitting: *Deleted

## 2022-05-22 MED FILL — Dexamethasone Sodium Phosphate Inj 100 MG/10ML: INTRAMUSCULAR | Qty: 1 | Status: AC

## 2022-05-22 NOTE — Patient Outreach (Signed)
  Care Coordination   05/22/2022 Name: Nadie Fiumara MRN: [id number] DOB: Apr 01, 1951   Care Coordination Outreach Attempts:  A second unsuccessful outreach was attempted today to offer the patient with information about available care coordination services as a benefit of their health plan.     Follow Up Plan:  Additional outreach attempts will be made to offer the patient care coordination information and services.   Encounter Outcome:  No Answer  Care Coordination Interventions Activated:  No   Care Coordination Interventions:  No, not indicated    Raina Mina, RN Care Management Coordinator [place] Office [phone]

## 2022-05-22 NOTE — Progress Notes (Signed)
Patient Care Team: [name], FNP as PCP - General (Family Medicine) Jake Bathe, MD (Cardiology) Alleen Borne, MD (Cardiothoracic Surgery) Anselm Lis, RPH-CPP (Pharmacist) Nils Pyle, MD as Referring Physician (Radiation Oncology) Serena Croissant, MD as Consulting Physician (Hematology and Oncology)  DIAGNOSIS:  Encounter Diagnoses  Name Primary?   Malignant neoplasm of upper-outer quadrant of right breast in female, estrogen receptor positive (HCC)    Carcinoma of right breast metastatic to lung (HCC)     SUMMARY OF ONCOLOGIC HISTORY: Oncology History  Carcinoma of right breast metastatic to lung (HCC)  10/10/2011 Initial Diagnosis   Carcinoma of right breast metastatic to lung (HCC)   05/02/2022 -  Chemotherapy   Patient is on Treatment Plan : BREAST METASTATIC Fam-Trastuzumab Deruxtecan-nxki (Enhertu) (5.4) q21d     Malignant neoplasm of upper-outer quadrant of right breast in female, estrogen receptor positive (HCC)  03/22/2004 Initial Diagnosis   right mastectomy and sentinel lymph node sampling 03/22/2004 for a right upper-outer quadrant pT2 pN1, stage IIB invasive ductal carcinoma with lobular features, grade 2, estrogen receptor and progesterone receptor positive, HER-2 negative, with an MIB-1 of 9%   05/2004 - 09/18/2004 Chemotherapy   adjuvant chemotherapy with dose dense doxorubicin and cyclophosphamide x4 cycles (first cycle delayed one week) followed by dose dense paclitaxel x4 was completed 09/18/2004   09/2004 - 2009 Anti-estrogen oral therapy   tamoxifen started March 2006, discontinued 2009   08/2011 Relapse/Recurrence   Metastatic disease:large pericardial effusion, large right pleural effusion and possible right middle lobe bronchial obstruction  status post pericardial window placement, fiberoptic bronchoscopy and right Pleurx placement 09/11/2011, with biopsy of the bronchus intermedius and pericardium positive for metastatic breast cancer,  ER 91%, PR 100% p with an MIB-1 of 35%, and no HER-2 amplification, ratio being 1.37   10/10/2011 - 07/2019 Anti-estrogen oral therapy   BOLERO-4 trial 10/10/2011, receiving letrozole and everolimus (stopped for progression)   08/16/2019 Miscellaneous   foundation 1 requested on liver biopsy from 08/16/2019 shows a stable microsatellite status with 1 mutation/MB, amplification of [id number], FGF10 and RICTOR, with mutations in ESR1 and PTEN   08/16/2019 Miscellaneous   Liver biopsy: Metastatic carcinoma to the liver consistent with breast primary, ER 100%, PR 100%, Ki-67 5%, HER2 2+ by IHC, FISH HER2 negative   08/24/2019 -  Anti-estrogen oral therapy   fulvestrant started 08/24/2019, repeated every 4 weeks Palbociclib 125 mg daily 21 days on 7 days off started 08/25/2019 Switched to ribociclib s starting 08/22/2020   02/28/2021 -  Radiation Therapy   palliative radiation (Dr. Langston Masker in [place]) 02/28/2021 to right hip area   10/27/2021 -  Anti-estrogen oral therapy   Abemaciclib with fulvestrant   01/14/2022 Miscellaneous   Guardant360: ESR 1 mutation (no other mutations) MSI high was not detected, PTEN, T p53, EGFR amplification, RB1 mutations detected without any approved treatment options     CHIEF COMPLIANT: Follow-up Metastatic carcinoma cycle 2 Enhertu  INTERVAL HISTORY: Barbara Parks is a 71 year old above-mentioned with metastatic breast cancer who was switched from ribociclib to abemaciclib. She presents to the clinic today for cycle 2 Enhertu. She reports that after treatment she was very tired and had dry heaves. She complains of eyes watering and hurting her allergies has been bothering her. Overall she is doing great.   ALLERGIES:  is allergic to aspirin, latex, and nsaids.  MEDICATIONS:  Current Outpatient Medications  Medication Sig Dispense Refill   cholecalciferol (VITAMIN D3) 25 MCG (1000 UT)  tablet Take 2 tablets (2,000 Units total) by mouth daily. 180 tablet 4    gemfibrozil (LOPID) 600 MG tablet Take 1 tablet (600 mg total) by mouth 2 (two) times daily before a meal. 180 tablet 2   lidocaine-prilocaine (EMLA) cream Apply to affected area once 30 g 3   loperamide (IMODIUM) 2 MG capsule Take 2 mg by mouth 4 (four) times daily as needed for diarrhea or loose stools. Reported on 11/30/2015     metFORMIN (GLUCOPHAGE-XR) 500 MG 24 hr tablet Take 1 tablet (500 mg total) by mouth daily with supper. Followed by [facility]  3   omeprazole (PRILOSEC OTC) 20 MG tablet Take 20 mg by mouth daily as needed (acid reflux).     ondansetron (ZOFRAN) 8 MG tablet Take 1 tablet (8 mg total) by mouth every 8 (eight) hours as needed for nausea or vomiting. 30 tablet 2   ondansetron (ZOFRAN) 8 MG tablet Take 1 tablet (8 mg total) by mouth every 8 (eight) hours as needed for nausea or vomiting. Start on the third day after chemotherapy. 30 tablet 1   prochlorperazine (COMPAZINE) 10 MG tablet Take 1 tablet (10 mg total) by mouth every 8 (eight) hours as needed for nausea or vomiting. 30 tablet 2   prochlorperazine (COMPAZINE) 10 MG tablet Take 1 tablet (10 mg total) by mouth every 6 (six) hours as needed for nausea or vomiting. 30 tablet 1   No current facility-administered medications for this visit.    PHYSICAL EXAMINATION: ECOG PERFORMANCE STATUS: 1 - Symptomatic but completely ambulatory  Vitals:   05/23/22 1111  BP: 107/66  Pulse: (!) 109  Resp: 17  Temp: 97.7 F (36.5 C)  SpO2: 96%   Filed Weights   05/23/22 1111  Weight: 146 lb 14.4 oz (66.6 kg)      LABORATORY DATA:  I have reviewed the data as listed    Latest Ref Rng & Units 05/23/2022   10:47 AM 05/02/2022   12:58 PM 04/08/2022    2:15 PM  CMP  Glucose 70 - 99 mg/dL 96  213  086   BUN 8 - 23 mg/dL 16  22  22    Creatinine 0.44 - 1.00 mg/dL 5.78  4.69  6.29   Sodium 135 - 145 mmol/L 136  135  135   Potassium 3.5 - 5.1 mmol/L 4.2  4.5  4.5   Chloride 98 - 111 mmol/L 104  105  103   CO2 22 - 32  mmol/L 24  22  26    Calcium 8.9 - 10.3 mg/dL 52.8  41.3  24.4   Total Protein 6.5 - 8.1 g/dL 7.7  8.2  7.7   Total Bilirubin 0.3 - 1.2 mg/dL 0.3  0.3  0.2   Alkaline Phos 38 - 126 U/L 116  94  103   AST 15 - 41 U/L 18  15  16    ALT 0 - 44 U/L 9  7  8      Lab Results  Component Value Date   WBC 9.8 05/23/2022   HGB 12.9 05/23/2022   HCT 37.1 05/23/2022   MCV 97.9 05/23/2022   PLT 376 05/23/2022   NEUTROABS 7.1 05/23/2022    ASSESSMENT & PLAN:  Malignant neoplasm of upper-outer quadrant of right breast in female, estrogen receptor positive (HCC) Metastatic breast cancer (pericardial effusion, pleural effusion, right middle lobe lung lesion, ER 91%, PR 90%, Ki-67 35%, HER2 negative diagnosed February 2023)   (Prior history of right mastectomy  2005, adjuvant chemo with Adriamycin Cytoxan and Taxol followed by tamoxifen)   Current treatment:  1. Ribociclib, fulvestrant, Xgeva, ribociclib discontinued 10/18/2021  2. abemaciclib started 10/27/2021 Foundation 1 in 2021: MSI stable, TMB 1, ESR 1 mutation, PTEN, FGF 10, RICTOR amplifications   Bone scan: Uptake in multiple sites of bone metastatic disease corresponding to site seen on previous exam as well as T10   Liver MRI 01/12/2022: Progression of metastatic disease in the liver with increased number and size of hepatic metastases, widespread metastatic disease to the bones, moderate pleural effusion   Guardant360 01/14/2022: No actionable mutations.  ESR 1 mutation. Liver biopsy 02/26/2022: Metastatic carcinoma consistent with breast primary. Caris mutation testing 02/26/2022: HER2 low, ESR 1 mutation, PTEN   Treatment plan: Enhertu every 3 weeks, today cycle 2 She has a new puppy named Ricky.  Enhertu toxicities: Fatigue that lasted for 4 to 5 days Mild nausea: I will add Emend to her regimen   Return to clinic every 3 weeks for Enhertu and every 6 weeks for follow-up with me.    No orders of the defined types were placed in this  encounter.  The patient has a good understanding of the overall plan. she agrees with it. she will call with any problems that may develop before the next visit here. Total time spent: 30 mins including face to face time and time spent for planning, charting and co-ordination of care   Tamsen Meek, MD 05/23/22    I Janan Ridge am scribing for Dr. [name]  I have reviewed the above documentation for accuracy and completeness, and I agree with the above.

## 2022-05-23 ENCOUNTER — Inpatient Hospital Stay: Payer: Medicare Other

## 2022-05-23 ENCOUNTER — Other Ambulatory Visit: Payer: Self-pay

## 2022-05-23 ENCOUNTER — Inpatient Hospital Stay (HOSPITAL_BASED_OUTPATIENT_CLINIC_OR_DEPARTMENT_OTHER): Payer: Medicare Other | Admitting: Hematology and Oncology

## 2022-05-23 VITALS — BP 124/68 | HR 88 | Resp 17

## 2022-05-23 DIAGNOSIS — C7800 Secondary malignant neoplasm of unspecified lung: Secondary | ICD-10-CM | POA: Diagnosis not present

## 2022-05-23 DIAGNOSIS — Z170 Estrogen receptor positive status [ER+]: Secondary | ICD-10-CM

## 2022-05-23 DIAGNOSIS — Z5111 Encounter for antineoplastic chemotherapy: Secondary | ICD-10-CM | POA: Diagnosis not present

## 2022-05-23 DIAGNOSIS — C50911 Malignant neoplasm of unspecified site of right female breast: Secondary | ICD-10-CM

## 2022-05-23 DIAGNOSIS — C787 Secondary malignant neoplasm of liver and intrahepatic bile duct: Secondary | ICD-10-CM | POA: Diagnosis not present

## 2022-05-23 DIAGNOSIS — C7951 Secondary malignant neoplasm of bone: Secondary | ICD-10-CM | POA: Diagnosis not present

## 2022-05-23 DIAGNOSIS — C50411 Malignant neoplasm of upper-outer quadrant of right female breast: Secondary | ICD-10-CM | POA: Diagnosis not present

## 2022-05-23 DIAGNOSIS — Z79899 Other long term (current) drug therapy: Secondary | ICD-10-CM | POA: Diagnosis not present

## 2022-05-23 LAB — CBC WITH DIFFERENTIAL (CANCER CENTER ONLY)
Abs Immature Granulocytes: 0.13 10*3/uL — ABNORMAL HIGH (ref 0.00–0.07)
Basophils Absolute: 0.2 10*3/uL — ABNORMAL HIGH (ref 0.0–0.1)
Basophils Relative: 2 %
Eosinophils Absolute: 0.2 10*3/uL (ref 0.0–0.5)
Eosinophils Relative: 2 %
HCT: 37.1 % (ref 36.0–46.0)
Hemoglobin: 12.9 g/dL (ref 12.0–15.0)
Immature Granulocytes: 1 %
Lymphocytes Relative: 7 %
Lymphs Abs: 0.7 10*3/uL (ref 0.7–4.0)
MCH: 34 pg (ref 26.0–34.0)
MCHC: 34.8 g/dL (ref 30.0–36.0)
MCV: 97.9 fL (ref 80.0–100.0)
Monocytes Absolute: 1.5 10*3/uL — ABNORMAL HIGH (ref 0.1–1.0)
Monocytes Relative: 15 %
Neutro Abs: 7.1 10*3/uL (ref 1.7–7.7)
Neutrophils Relative %: 73 %
Platelet Count: 376 10*3/uL (ref 150–400)
RBC: 3.79 MIL/uL — ABNORMAL LOW (ref 3.87–5.11)
RDW: 14.8 % (ref 11.5–15.5)
WBC Count: 9.8 10*3/uL (ref 4.0–10.5)
nRBC: 0 % (ref 0.0–0.2)

## 2022-05-23 LAB — CMP (CANCER CENTER ONLY)
ALT: 9 U/L (ref 0–44)
AST: 18 U/L (ref 15–41)
Albumin: 4.1 g/dL (ref 3.5–5.0)
Alkaline Phosphatase: 116 U/L (ref 38–126)
Anion gap: 8 (ref 5–15)
BUN: 16 mg/dL (ref 8–23)
CO2: 24 mmol/L (ref 22–32)
Calcium: 10.3 mg/dL (ref 8.9–10.3)
Chloride: 104 mmol/L (ref 98–111)
Creatinine: 1.01 mg/dL — ABNORMAL HIGH (ref 0.44–1.00)
GFR, Estimated: 60 mL/min — ABNORMAL LOW (ref >60)
Glucose, Bld: 96 mg/dL (ref 70–99)
Potassium: 4.2 mmol/L (ref 3.5–5.1)
Sodium: 136 mmol/L (ref 135–145)
Total Bilirubin: 0.3 mg/dL (ref 0.3–1.2)
Total Protein: 7.7 g/dL (ref 6.5–8.1)

## 2022-05-23 MED ORDER — SODIUM CHLORIDE 0.9 % IV SOLN
10.0000 mg | Freq: Once | INTRAVENOUS | Status: AC
  Administered 2022-05-23: 10 mg via INTRAVENOUS
  Filled 2022-05-23: qty 10

## 2022-05-23 MED ORDER — DEXTROSE 5 % IV SOLN: Freq: Once | INTRAVENOUS | Status: AC

## 2022-05-23 MED ORDER — HEPARIN SOD (PORK) LOCK FLUSH 100 UNIT/ML IV SOLN
500.0000 [IU] | Freq: Once | INTRAVENOUS | Status: AC | PRN
  Administered 2022-05-23: 500 [IU]

## 2022-05-23 MED ORDER — DIPHENHYDRAMINE HCL 25 MG PO CAPS
50.0000 mg | ORAL_CAPSULE | Freq: Once | ORAL | Status: AC
  Administered 2022-05-23: 50 mg via ORAL
  Filled 2022-05-23: qty 2

## 2022-05-23 MED ORDER — ACETAMINOPHEN 325 MG PO TABS
650.0000 mg | ORAL_TABLET | Freq: Once | ORAL | Status: AC
  Administered 2022-05-23: 650 mg via ORAL
  Filled 2022-05-23: qty 2

## 2022-05-23 MED ORDER — SODIUM CHLORIDE 0.9% FLUSH
10.0000 mL | INTRAVENOUS | Status: AC | PRN
  Administered 2022-05-23: 10 mL

## 2022-05-23 MED ORDER — SODIUM CHLORIDE 0.9% FLUSH
10.0000 mL | INTRAVENOUS | Status: DC | PRN
  Administered 2022-05-23: 10 mL

## 2022-05-23 MED ORDER — SODIUM CHLORIDE 0.9 % IV SOLN
150.0000 mg | Freq: Once | INTRAVENOUS | Status: AC
  Administered 2022-05-23: 150 mg via INTRAVENOUS
  Filled 2022-05-23: qty 150

## 2022-05-23 MED ORDER — PALONOSETRON HCL INJECTION 0.25 MG/5ML
0.2500 mg | Freq: Once | INTRAVENOUS | Status: DC
  Filled 2022-05-23: qty 5

## 2022-05-23 MED ORDER — PALONOSETRON HCL INJECTION 0.25 MG/5ML
0.2500 mg | Freq: Once | INTRAVENOUS | Status: AC
  Administered 2022-05-23: 0.25 mg via INTRAVENOUS
  Filled 2022-05-23: qty 5

## 2022-05-23 MED ORDER — FAM-TRASTUZUMAB DERUXTECAN-NXKI CHEMO 100 MG IV SOLR
4.4200 mg/kg | Freq: Once | INTRAVENOUS | Status: AC
  Administered 2022-05-23: 300 mg via INTRAVENOUS
  Filled 2022-05-23: qty 15

## 2022-05-23 NOTE — Progress Notes (Signed)
Per Dr. Lindi Adie, Minor to proceed with tx today w/ elevated HR 102 BPM

## 2022-05-23 NOTE — Assessment & Plan Note (Signed)
Metastatic breast cancer (pericardial effusion, pleural effusion, right middle lobe lung lesion, ER 91%, PR 90%, Ki-67 35%, HER2 negative diagnosed February 2023)  (Prior history of right mastectomy 2005, adjuvant chemo with Adriamycin Cytoxan and Taxol followed by tamoxifen)  Current treatment:  1. Ribociclib, fulvestrant, Xgeva,ribociclib discontinued 10/18/2021  2. abemaciclib started 10/27/2021 Foundation 1 in 2021: MSI stable, TMB 1, ESR 1 mutation, PTEN, FGF 10, RICTOR amplifications  Bone scan: Uptake in multiple sites of bone metastatic disease corresponding to site seen on previous exam as well as T10  Liver MRI 01/12/2022: Progression of metastatic disease in the liver with increased number and size of hepatic metastases, widespread metastatic disease to the bones, moderate pleural effusion  Guardant360 01/14/2022: No actionable mutations. ESR 1 mutation. Liver biopsy 02/26/2022: Metastatic carcinoma consistent with breast primary. Caris mutation testing 02/26/2022: HER2 low, ESR 1 mutation, PTEN  Treatment plan: Enhertu every 3 weeks, today cycle 2 She has a new puppy named Ricky. Enhertu toxicities:   Return to clinic every 3 weeks for Enhertu and every 6 weeks for follow-up with me.

## 2022-05-23 NOTE — Patient Instructions (Signed)
[facility] MEDICAL ONCOLOGY  Discharge Instructions: Thank you for choosing [facility] to provide your oncology and hematology care.   If you have a lab appointment with the Cancer Center, please go directly to the Cancer Center and check in at the registration area.   Wear comfortable clothing and clothing appropriate for easy access to any Portacath or PICC line.   We strive to give you quality time with your provider. You may need to reschedule your appointment if you arrive late (15 or more minutes).  Arriving late affects you and other patients whose appointments are after yours.  Also, if you miss three or more appointments without notifying the office, you may be dismissed from the clinic at the provider's discretion.      For prescription refill requests, have your pharmacy contact our office and allow 72 hours for refills to be completed.    Today you received the following chemotherapy and/or immunotherapy agents: Enhertu      To help prevent nausea and vomiting after your treatment, we encourage you to take your nausea medication as directed.  BELOW ARE SYMPTOMS THAT SHOULD BE REPORTED IMMEDIATELY: *FEVER GREATER THAN 100.4 F (38 C) OR HIGHER *CHILLS OR SWEATING *NAUSEA AND VOMITING THAT IS NOT CONTROLLED WITH YOUR NAUSEA MEDICATION *UNUSUAL SHORTNESS OF BREATH *UNUSUAL BRUISING OR BLEEDING *URINARY PROBLEMS (pain or burning when urinating, or frequent urination) *BOWEL PROBLEMS (unusual diarrhea, constipation, pain near the anus) TENDERNESS IN MOUTH AND THROAT WITH OR WITHOUT PRESENCE OF ULCERS (sore throat, sores in mouth, or a toothache) UNUSUAL RASH, SWELLING OR PAIN  UNUSUAL VAGINAL DISCHARGE OR ITCHING   Items with * indicate a potential emergency and should be followed up as soon as possible or go to the Emergency Department if any problems should occur.  Please show the CHEMOTHERAPY ALERT CARD or IMMUNOTHERAPY ALERT CARD at check-in to  the Emergency Department and triage nurse.  Should you have questions after your visit or need to cancel or reschedule your appointment, please contact [facility] MEDICAL ONCOLOG[facility]  Dept: [phone]  and follow the prompts.  Office hours are 8:00 a.m. to 4:30 p.m. Monday - Friday. Please note that voicemails left after 4:00 p.m. may not be returned until the following business day.  We are closed weekends and major holidays. You have access to a nurse at all times for urgent questions. Please call the main number to the clinic Dept: [phone] and follow the prompts.   For any non-urgent questions, you may also contact your provider using MyChart. We now offer e-Visits for anyone 18 and older to request care online for non-urgent symptoms. For details visit [email or web address].   Also download the MyChart app! Go to the app store, search "MyChart", open the app, select [facility], and log in with your MyChart username and password.  Masks are optional in the cancer centers. If you would like for your care team to wear a mask while they are taking care of you, please let them know. You may have one support person who is at least 71 years old accompany you for your appointments. 

## 2022-05-25 LAB — CANCER ANTIGEN 27.29: CA 27.29: 695.2 U/mL — ABNORMAL HIGH (ref 0.0–38.6)

## 2022-05-26 IMAGING — MR MR ABDOMEN WO/W CM
18 series · 48 of 48 positions shown · IV contrast (7.ML GADAVIST)
Comparison: Multiple priors including MRI abdomen July 25, 2020.

CLINICAL DATA: Metastatic breast cancer, reassess hepatic lesions.

EXAM:
MRI ABDOMEN WITHOUT AND WITH CONTRAST
TECHNIQUE: Multiplanar multisequence MR imaging of the abdomen was performed
both before and after the administration of intravenous contrast.
CONTRAST:  7mL GADAVIST GADOBUTROL 1 MMOL/ML IV SOLN

[Series 3: T2 · coronal · 6.0mm · 1.56mm/px · 2 of 30 slices shown (1 of 2)]
[im 1/30]
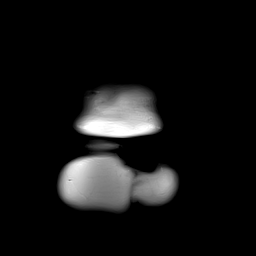
[im 30/30]
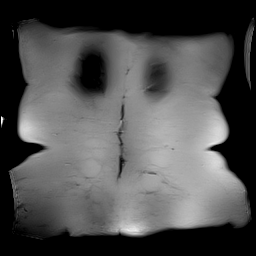

[Series 4: T2 fat-sat · axial · 6.0mm · 1.25mm/px · z∈[-165,+87]mm · 2 of 36 slices shown]
[im 1/36]
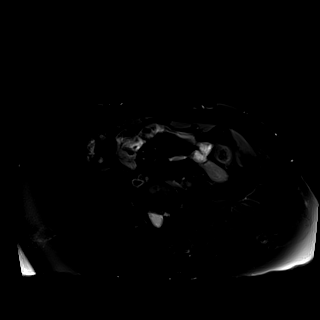
[im 36/36]
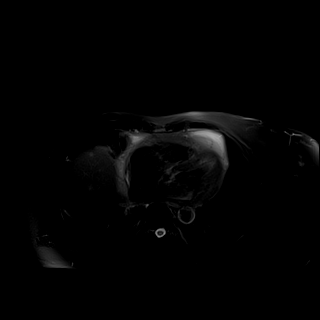

[Series 6: T1 · axial · 3.0mm · 1.25mm/px · z∈[-136,+77]mm · 4 of 72 slices shown (1 of 2)]
[im 1/72]
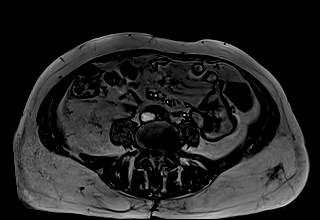
[im 24/72]
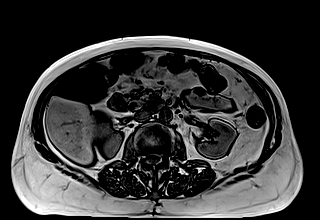
[im 48/72]
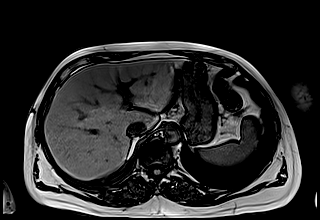
[im 72/72]
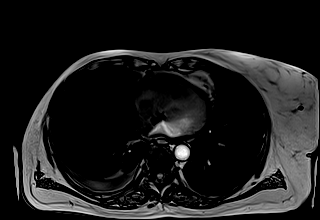

[Series 7: T1 · axial · 3.0mm · 1.25mm/px · z∈[-136,+77]mm · 4 of 72 slices shown (2 of 2)]
[im 1/72]
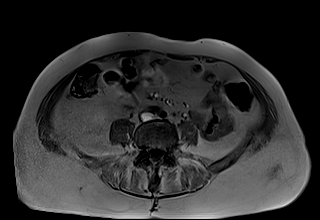
[im 24/72]
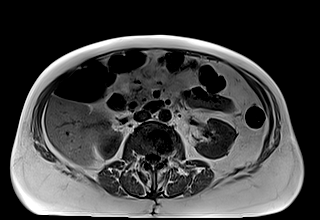
[im 48/72]
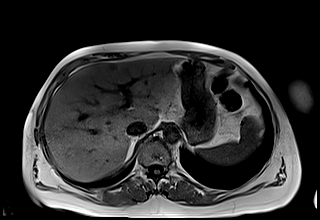
[im 72/72]
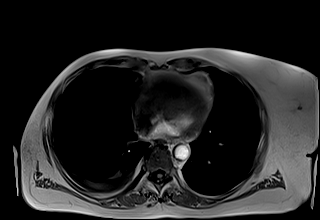

[Series 8: DWI · axial · 6.0mm · 1.49mm/px · z∈[-165,+87]mm · 4 of 72 slices shown (1 of 2)]
[im 1/72]
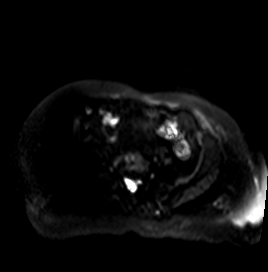
[im 24/72]
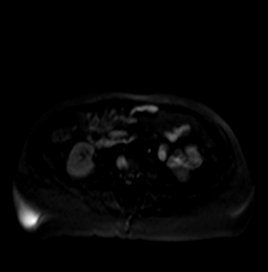
[im 48/72]
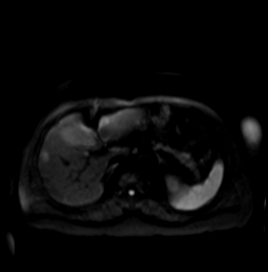
[im 72/72]
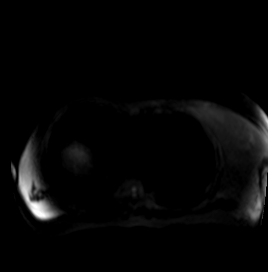

[Series 9: DWI · axial · 6.0mm · 1.49mm/px · 1 of 36 slices shown (2 of 2)]
[im 1/36]
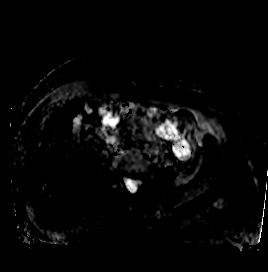

[Series 10: bSSFP · axial · 6.0mm · 0.84mm/px · 1 of 36 slices shown]
[im 1/36]
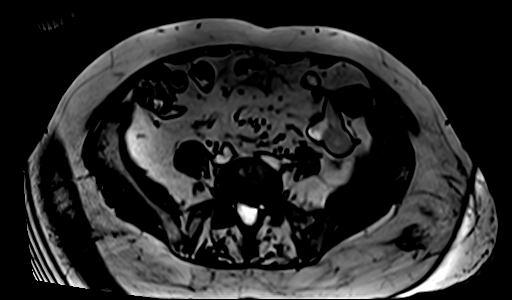

[Series 12: T1 dynamic · axial · 3.0mm · 1.25mm/px · z∈[-157,+80]mm · 3 of 80 slices shown (1 of 10)]
[im 1/80]
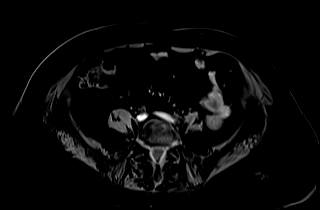
[im 40/80]
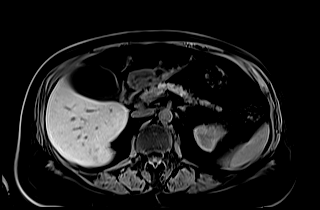
[im 80/80]
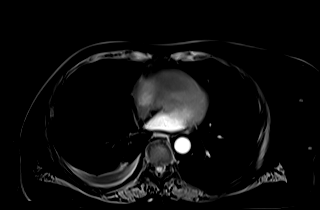

[Series 16: T1 dynamic · axial · 3.0mm · 1.25mm/px · z∈[-157,+80]mm · 3 of 80 slices shown (2 of 10)]
[im 1/80]
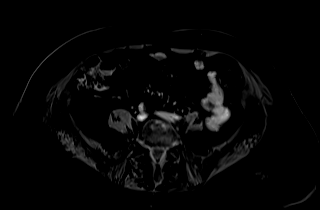
[im 40/80]
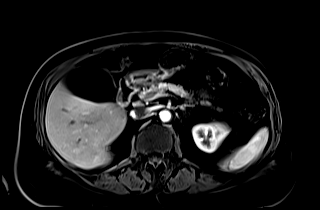
[im 80/80]
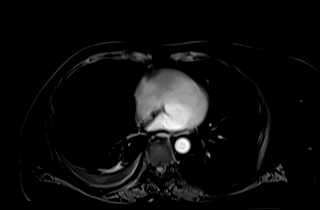

[Series 17: T1 dynamic · axial · 3.0mm · 1.25mm/px · z∈[-157,+80]mm · 3 of 80 slices shown (3 of 10)]
[im 1/80]
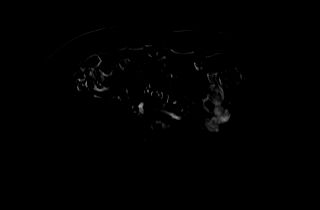
[im 40/80]
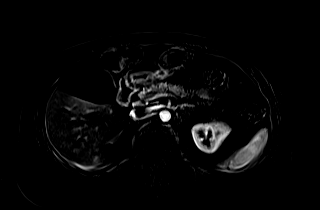
[im 80/80]
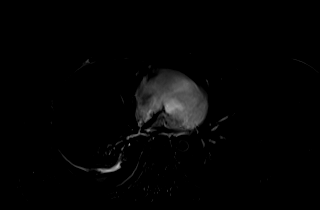

[Series 20: T1 dynamic · axial · 3.0mm · 1.25mm/px · z∈[-157,+80]mm · 3 of 80 slices shown (4 of 10)]
[im 1/80]
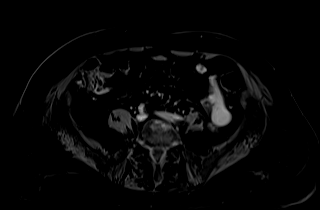
[im 40/80]
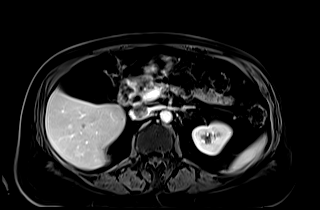
[im 80/80]
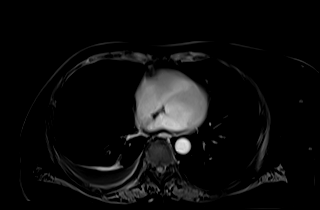

[Series 21: T1 dynamic · axial · 3.0mm · 1.25mm/px · z∈[-157,+80]mm · 3 of 80 slices shown (5 of 10)]
[im 1/80]
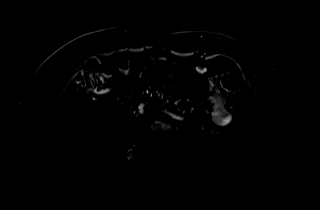
[im 40/80]
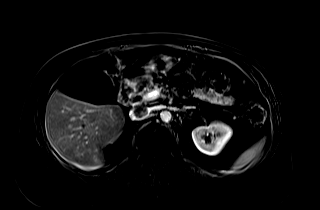
[im 80/80]
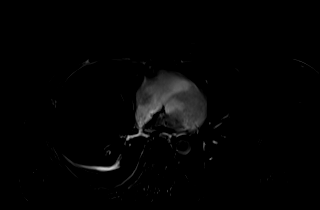

[Series 24: T1 dynamic · axial · 3.0mm · 1.25mm/px · z∈[-157,+80]mm · 3 of 80 slices shown (6 of 10)]
[im 1/80]
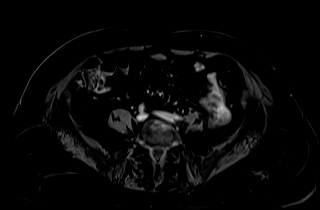
[im 40/80]
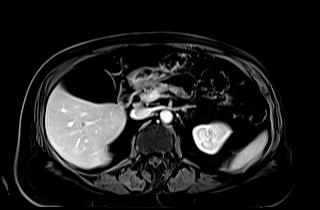
[im 80/80]
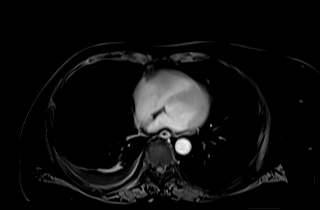

[Series 25: T1 dynamic · axial · 3.0mm · 1.25mm/px · z∈[-157,+80]mm · 3 of 80 slices shown (7 of 10)]
[im 1/80]
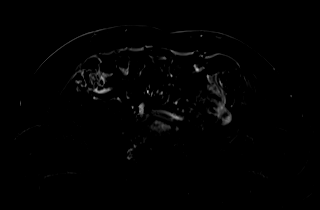
[im 40/80]
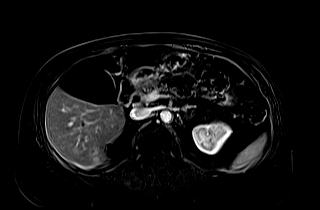
[im 80/80]
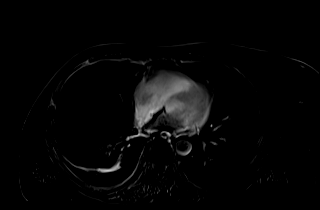

[Series 27: T1 dynamic · coronal · 5.0mm · 1.41mm/px · 2 of 52 slices shown (8 of 10)]
[im 1/52]
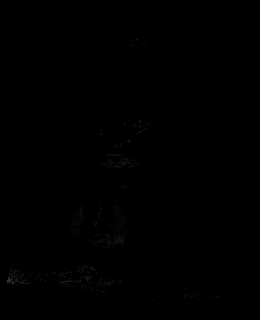
[im 52/52]
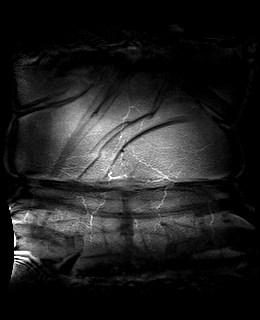

[Series 28: T2 · axial · 6.0mm · 1.56mm/px · 1 of 34 slices shown (2 of 2)]
[im 1/34]
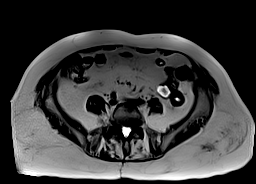

[Series 31: T1 dynamic · axial · 3.0mm · 1.25mm/px · z∈[-157,+80]mm · 3 of 80 slices shown (9 of 10)]
[im 1/80]
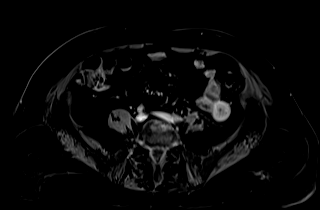
[im 40/80]
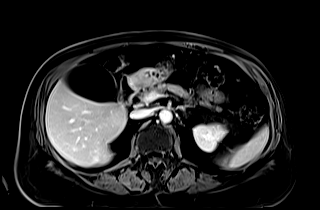
[im 80/80]
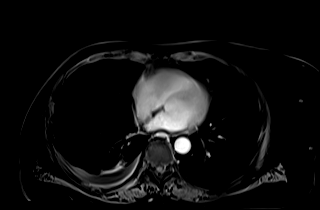

[Series 32: T1 dynamic · axial · 3.0mm · 1.25mm/px · z∈[-157,+80]mm · 3 of 80 slices shown (10 of 10)]
[im 1/80]
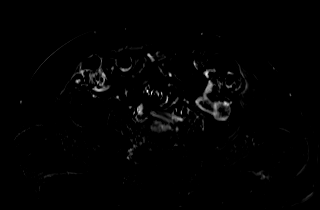
[im 40/80]
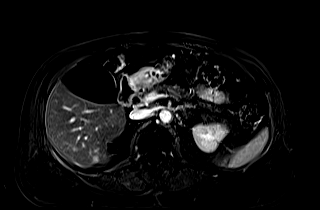
[im 80/80]
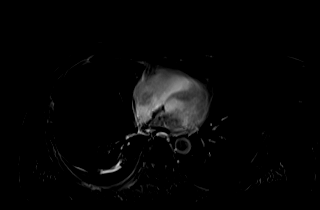

[48 of 48 positions shown; findings below may reference images not displayed]

FINDINGS: Lower chest: Similar appearance of the small right pleural effusion
with associated right lower lobe opacity.

Hepatobiliary: Similar size of the bilobar small hepatic metastases
(approximately 7-8), best visualized on diffusion and pre contrast
T1 imaging. No definite new or enlarging hepatic lesions visualized.
Index lesions are as follows:

-segment II lesion measures 12 mm on image [DATE], unchanged.

-segment V lesion measures 11 mm on image 36/12, unchanged.

-segment VI lesion measures it 9 mm on image 42/12, unchanged.

Adenomyomatosis in the fundus of the gallbladder. Stable mild
dilation of the common bile duct in the setting of periampullary
duodenal diverticulum.

Pancreas:  Within normal limits.

Spleen:  Within normal limits.

Adrenals/Urinary Tract: Unchanged size of the benign 13 mm left
adrenal adenoma. Right adrenal glands thin normal limits. No
hydronephrosis. No suspicious enhancing renal lesions.

Stomach/Bowel: Small periampullary duodenal diverticulum. Moderate
sized duodenal diverticulum at the third-fourth portion of the
duodenum. No acute gastrointestinal process within the visualized
bowel loops to the extent evaluated on MRI.

Vascular/Lymphatic: No pathologically enlarged lymph nodes
identified. No abdominal aortic aneurysm demonstrated.

Other:  No abdominal ascites.

Musculoskeletal: Stable multifocal osseous metastases including T10,
T11 and L3 better evaluated on prior CT and bone scan.
IMPRESSION: 1. No significant change in bilobar hepatic metastases. No definite
new or enlarging hepatic lesions visualized.
2. Stable multifocal osseous metastases better evaluated on prior CT
and bone scan.
3. Small right pleural effusion with associated right lower lobe
opacity, unchanged.

## 2022-05-27 ENCOUNTER — Other Ambulatory Visit: Payer: Self-pay | Admitting: Hematology and Oncology

## 2022-05-29 ENCOUNTER — Other Ambulatory Visit: Payer: Self-pay

## 2022-06-05 DIAGNOSIS — I700 Atherosclerosis of aorta: Secondary | ICD-10-CM | POA: Diagnosis not present

## 2022-06-05 DIAGNOSIS — C50911 Malignant neoplasm of unspecified site of right female breast: Secondary | ICD-10-CM | POA: Diagnosis not present

## 2022-06-05 DIAGNOSIS — C50411 Malignant neoplasm of upper-outer quadrant of right female breast: Secondary | ICD-10-CM | POA: Diagnosis not present

## 2022-06-05 DIAGNOSIS — J449 Chronic obstructive pulmonary disease, unspecified: Secondary | ICD-10-CM | POA: Diagnosis not present

## 2022-06-05 DIAGNOSIS — E785 Hyperlipidemia, unspecified: Secondary | ICD-10-CM | POA: Diagnosis not present

## 2022-06-05 DIAGNOSIS — Z0000 Encounter for general adult medical examination without abnormal findings: Secondary | ICD-10-CM | POA: Diagnosis not present

## 2022-06-05 DIAGNOSIS — E1165 Type 2 diabetes mellitus with hyperglycemia: Secondary | ICD-10-CM | POA: Diagnosis not present

## 2022-06-05 DIAGNOSIS — C7800 Secondary malignant neoplasm of unspecified lung: Secondary | ICD-10-CM | POA: Diagnosis not present

## 2022-06-12 MED FILL — Dexamethasone Sodium Phosphate Inj 100 MG/10ML: INTRAMUSCULAR | Qty: 1 | Status: AC

## 2022-06-12 MED FILL — Fosaprepitant Dimeglumine For IV Infusion 150 MG (Base Eq): INTRAVENOUS | Qty: 5 | Status: AC

## 2022-06-13 ENCOUNTER — Inpatient Hospital Stay: Payer: Medicare Other

## 2022-06-13 ENCOUNTER — Encounter: Payer: Self-pay | Admitting: Adult Health

## 2022-06-13 ENCOUNTER — Encounter: Payer: Self-pay | Admitting: Hematology and Oncology

## 2022-06-13 ENCOUNTER — Inpatient Hospital Stay: Payer: Medicare Other | Attending: Oncology

## 2022-06-13 ENCOUNTER — Inpatient Hospital Stay (HOSPITAL_BASED_OUTPATIENT_CLINIC_OR_DEPARTMENT_OTHER): Payer: Medicare Other | Admitting: Adult Health

## 2022-06-13 VITALS — BP 117/73 | HR 110 | Temp 97.2°F | Resp 18 | Wt 145.1 lb

## 2022-06-13 VITALS — HR 95

## 2022-06-13 DIAGNOSIS — C7800 Secondary malignant neoplasm of unspecified lung: Secondary | ICD-10-CM

## 2022-06-13 DIAGNOSIS — C50911 Malignant neoplasm of unspecified site of right female breast: Secondary | ICD-10-CM

## 2022-06-13 DIAGNOSIS — Z95828 Presence of other vascular implants and grafts: Secondary | ICD-10-CM

## 2022-06-13 DIAGNOSIS — Z5111 Encounter for antineoplastic chemotherapy: Secondary | ICD-10-CM | POA: Diagnosis not present

## 2022-06-13 DIAGNOSIS — R21 Rash and other nonspecific skin eruption: Secondary | ICD-10-CM | POA: Diagnosis not present

## 2022-06-13 DIAGNOSIS — Z79899 Other long term (current) drug therapy: Secondary | ICD-10-CM | POA: Insufficient documentation

## 2022-06-13 DIAGNOSIS — C787 Secondary malignant neoplasm of liver and intrahepatic bile duct: Secondary | ICD-10-CM | POA: Insufficient documentation

## 2022-06-13 DIAGNOSIS — C7951 Secondary malignant neoplasm of bone: Secondary | ICD-10-CM

## 2022-06-13 DIAGNOSIS — C50411 Malignant neoplasm of upper-outer quadrant of right female breast: Secondary | ICD-10-CM | POA: Diagnosis not present

## 2022-06-13 LAB — CMP (CANCER CENTER ONLY)
ALT: 8 U/L (ref 0–44)
AST: 15 U/L (ref 15–41)
Albumin: 4.3 g/dL (ref 3.5–5.0)
Alkaline Phosphatase: 128 U/L — ABNORMAL HIGH (ref 38–126)
Anion gap: 10 (ref 5–15)
BUN: 20 mg/dL (ref 8–23)
CO2: 23 mmol/L (ref 22–32)
Calcium: 10.3 mg/dL (ref 8.9–10.3)
Chloride: 105 mmol/L (ref 98–111)
Creatinine: 1.15 mg/dL — ABNORMAL HIGH (ref 0.44–1.00)
GFR, Estimated: 51 mL/min — ABNORMAL LOW (ref >60)
Glucose, Bld: 139 mg/dL — ABNORMAL HIGH (ref 70–99)
Potassium: 4.3 mmol/L (ref 3.5–5.1)
Sodium: 138 mmol/L (ref 135–145)
Total Bilirubin: 0.3 mg/dL (ref 0.3–1.2)
Total Protein: 7.9 g/dL (ref 6.5–8.1)

## 2022-06-13 LAB — CBC WITH DIFFERENTIAL (CANCER CENTER ONLY)
Abs Immature Granulocytes: 0.12 10*3/uL — ABNORMAL HIGH (ref 0.00–0.07)
Basophils Absolute: 0.1 10*3/uL (ref 0.0–0.1)
Basophils Relative: 1 %
Eosinophils Absolute: 0.2 10*3/uL (ref 0.0–0.5)
Eosinophils Relative: 2 %
HCT: 37.7 % (ref 36.0–46.0)
Hemoglobin: 12.8 g/dL (ref 12.0–15.0)
Immature Granulocytes: 1 %
Lymphocytes Relative: 8 %
Lymphs Abs: 0.7 10*3/uL (ref 0.7–4.0)
MCH: 33.9 pg (ref 26.0–34.0)
MCHC: 34 g/dL (ref 30.0–36.0)
MCV: 99.7 fL (ref 80.0–100.0)
Monocytes Absolute: 1.2 10*3/uL — ABNORMAL HIGH (ref 0.1–1.0)
Monocytes Relative: 13 %
Neutro Abs: 6.8 10*3/uL (ref 1.7–7.7)
Neutrophils Relative %: 75 %
Platelet Count: 366 10*3/uL (ref 150–400)
RBC: 3.78 MIL/uL — ABNORMAL LOW (ref 3.87–5.11)
RDW: 15.4 % (ref 11.5–15.5)
WBC Count: 9.2 10*3/uL (ref 4.0–10.5)
nRBC: 0 % (ref 0.0–0.2)

## 2022-06-13 MED ORDER — SODIUM CHLORIDE 0.9% FLUSH
10.0000 mL | Freq: Once | INTRAVENOUS | Status: AC
  Administered 2022-06-13: 10 mL

## 2022-06-13 MED ORDER — SODIUM CHLORIDE 0.9 % IV SOLN
150.0000 mg | Freq: Once | INTRAVENOUS | Status: AC
  Administered 2022-06-13: 150 mg via INTRAVENOUS
  Filled 2022-06-13: qty 150

## 2022-06-13 MED ORDER — DEXTROSE 5 % IV SOLN: Freq: Once | INTRAVENOUS | Status: AC

## 2022-06-13 MED ORDER — ACETAMINOPHEN 325 MG PO TABS
650.0000 mg | ORAL_TABLET | Freq: Once | ORAL | Status: AC
  Administered 2022-06-13: 650 mg via ORAL
  Filled 2022-06-13: qty 2

## 2022-06-13 MED ORDER — FAM-TRASTUZUMAB DERUXTECAN-NXKI CHEMO 100 MG IV SOLR
4.4200 mg/kg | Freq: Once | INTRAVENOUS | Status: AC
  Administered 2022-06-13: 300 mg via INTRAVENOUS
  Filled 2022-06-13: qty 15

## 2022-06-13 MED ORDER — PALONOSETRON HCL INJECTION 0.25 MG/5ML
0.2500 mg | Freq: Once | INTRAVENOUS | Status: AC
  Administered 2022-06-13: 0.25 mg via INTRAVENOUS
  Filled 2022-06-13: qty 5

## 2022-06-13 MED ORDER — DIPHENHYDRAMINE HCL 25 MG PO CAPS
50.0000 mg | ORAL_CAPSULE | Freq: Once | ORAL | Status: AC
  Administered 2022-06-13: 50 mg via ORAL
  Filled 2022-06-13: qty 2

## 2022-06-13 MED ORDER — TRIAMCINOLONE ACETONIDE 0.025 % EX OINT: 1.0000 | TOPICAL_OINTMENT | Freq: Two times a day (BID) | CUTANEOUS | 0 refills | Status: DC

## 2022-06-13 MED ORDER — SODIUM CHLORIDE 0.9 % IV SOLN
10.0000 mg | Freq: Once | INTRAVENOUS | Status: AC
  Administered 2022-06-13: 10 mg via INTRAVENOUS
  Filled 2022-06-13: qty 10

## 2022-06-13 NOTE — Assessment & Plan Note (Signed)
Barbara Parks is a 71 year old woman with history of metastatic breast cancer here today for follow-up and evaluation prior to receiving treatment with Enhertu.  Her most recent echocardiogram was normal and her labs are stable she will proceed with treatment today.  We reviewed the skin around her eyes and I have recommended that she get Aquaphor to apply as needed and wrote for triamcinolone ointment to apply only to the skin not to get into the eye twice a day.  I offered to take a break for a week from chemotherapy and return next week to see if the eyes are better however she really does not want to do that.  I reviewed the above with Dr. Lindi Adie who is in agreement.  She will return in 3 weeks for labs, follow-up, and her next appointment.

## 2022-06-13 NOTE — Progress Notes (Signed)
[facility] Follow up:    Saintclair Halsted, FNP [place] [address]   DIAGNOSIS:  Cancer Staging  Carcinoma of right breast metastatic to lung [facility]) Staging form: Breast, AJCC 7th Edition - Clinical: Stage IV ([place], NX, M1) - Signed by Deatra Robinson, MD on 09/09/2013 Laterality: Right Biopsy of metastatic site performed: No - Pathologic: No stage assigned - Unsigned Laterality: Right  Malignant neoplasm of upper-outer quadrant of right breast in female, estrogen receptor positive ([place]) Staging form: Breast, AJCC 7th Edition - Clinical: Stage IV ([place], NX, M1) - Signed by Chauncey Cruel, MD on 08/13/2014   SUMMARY OF ONCOLOGIC HISTORY: Oncology History  Carcinoma of right breast metastatic to lung ([place])  10/10/2011 Initial Diagnosis   Carcinoma of right breast metastatic to lung ([place])   05/02/2022 -  Chemotherapy   Patient is on Treatment Plan : BREAST METASTATIC Fam-Trastuzumab Deruxtecan-nxki (Enhertu) (5.4) q21d     Malignant neoplasm of upper-outer quadrant of right breast in female, estrogen receptor positive ([place])  03/22/2004 Initial Diagnosis   right mastectomy and sentinel lymph node sampling 03/22/2004 for a right upper-outer quadrant pT2 pN1, stage IIB invasive ductal carcinoma with lobular features, grade 2, estrogen receptor and progesterone receptor positive, HER-2 negative, with an MIB-1 of 9%   05/2004 - 09/18/2004 Chemotherapy   adjuvant chemotherapy with dose dense doxorubicin and cyclophosphamide x4 cycles (first cycle delayed one week) followed by dose dense paclitaxel x4 was completed 09/18/2004   09/2004 - 2009 Anti-estrogen oral therapy   tamoxifen started March 2006, discontinued 2009   08/2011 Relapse/Recurrence   Metastatic disease:large pericardial effusion, large right pleural effusion and possible right middle lobe bronchial obstruction  status post pericardial window placement, fiberoptic bronchoscopy and  right Pleurx placement 09/11/2011, with biopsy of the bronchus intermedius and pericardium positive for metastatic breast cancer, ER 91%, PR 100% p with an MIB-1 of 35%, and no HER-2 amplification, ratio being 1.37   10/10/2011 - 07/2019 Anti-estrogen oral therapy   BOLERO-4 trial 10/10/2011, receiving letrozole and everolimus (stopped for progression)   08/16/2019 Miscellaneous   foundation 1 requested on liver biopsy from 08/16/2019 shows a stable microsatellite status with 1 mutation/MB, amplification of C11orf30, FGF10 and RICTOR, with mutations in ESR1 and PTEN   08/16/2019 Miscellaneous   Liver biopsy: Metastatic carcinoma to the liver consistent with breast primary, ER 100%, PR 100%, Ki-67 5%, HER2 2+ by IHC, FISH HER2 negative   08/24/2019 -  Anti-estrogen oral therapy   fulvestrant started 08/24/2019, repeated every 4 weeks Palbociclib 125 mg daily 21 days on 7 days off started 08/25/2019 Switched to ribociclib s starting 08/22/2020   02/28/2021 -  Radiation Therapy   palliative radiation (Dr. Lynnette Caffey in [place]) 02/28/2021 to right hip area   10/27/2021 -  Anti-estrogen oral therapy   Abemaciclib with fulvestrant   01/14/2022 Miscellaneous   Guardant360: ESR 1 mutation (no other mutations) MSI high was not detected, PTEN, T p53, EGFR amplification, RB1 mutations detected without any approved treatment options     CURRENT THERAPY: Enhertu INTERVAL HISTORY: Barbara Parks 71 y.o. female returns for follow-up prior to receiving Enhertu treatment.  She began therapy on October 5.  Today she is here for cycle 3.  Since the last treatment she has noticed some redness and dryness on the skin on her eyelids and under her eyes.  This is not inside her eye she does not have any eye drainage, vision changes, or pain  in the eye.  Patient Active Problem List   Diagnosis Date Noted   Port-A-Cath in place 06/13/2022   Liver metastases 08/09/2019   Malignant neoplasm metastatic to bone ([place])  12/23/2018   Goals of care, counseling/discussion 12/23/2018   Hyperglycemia 10/02/2017   Hepatic steatosis 01/23/2017   OA (osteoarthritis) of hip 06/07/2015   Elevated cholesterol with high triglycerides 03/22/2015   Malignant neoplasm of upper-outer quadrant of right breast in female, estrogen receptor positive ([place]) 08/13/2014   Thoracic aorta atherosclerosis ([place]) 08/13/2014   Proteinuria 06/16/2014   Hypercalcemia 06/16/2014   Fatigue 04/21/2014   ADHD (attention deficit hyperactivity disorder)    Osteopenia due to cancer therapy 09/09/2013   Carcinoma of right breast metastatic to lung ([place]) 10/10/2011   Malignant pericardial effusion 09/10/2011   Pleural effusion 09/10/2011   Pneumonia 08/26/2011   Asthma in adult without complication 72/03/4708   Osteoarthritis of left hip 08/17/2010    is allergic to aspirin, latex, and nsaids.  MEDICAL HISTORY: Past Medical History:  Diagnosis Date   Arthritis    left hip   Asthma    breast ca 2005   breast/chemo R mastectomy   Breast cancer ([place])    Dermatitis    Diabetes mellitus without complication ([place])    GERD (gastroesophageal reflux disease)    Hypercholesteremia    Metastasis to lung ([place]) dx'd 08/2011   Osteopenia due to cancer therapy 09/09/2013   Palpitations    Personal history of chemotherapy    Shortness of breath     SURGICAL HISTORY: Past Surgical History:  Procedure Laterality Date   BREAST SURGERY  2005   right   CATARACT EXTRACTION W/PHACO Left 01/18/2014   Procedure: CATARACT EXTRACTION PHACO AND INTRAOCULAR LENS PLACEMENT ([place]);  Surgeon: Elta Guadeloupe T. Gershon Crane, MD;  Location: [facility];  Service: Ophthalmology;  Laterality: Left;  CDE 15.79   CATARACT EXTRACTION W/PHACO Right 02/08/2014   Procedure: CATARACT EXTRACTION PHACO AND INTRAOCULAR LENS PLACEMENT ([facility]);  Surgeon: Elta Guadeloupe T. Gershon Crane, MD;  Location: [facility];  Service: Ophthalmology;  Laterality: Right;  CDE 4.41   CHEST TUBE INSERTION  09/11/2011    Procedure: INSERTION PLEURAL DRAINAGE CATHETER;  Surgeon: Gaye Pollack, MD;  Location: [facility];  Service: Thoracic;  Laterality: Right;   IR IMAGING GUIDED PORT INSERTION  04/23/2022   MASTECTOMY Right    PERICARDIAL WINDOW  09/11/2011   Procedure: PERICARDIAL WINDOW;  Surgeon: Gaye Pollack, MD;  Location: [facility];  Service: Thoracic;  Laterality: N/A;   REMOVAL OF PLEURAL DRAINAGE CATHETER  12/19/2011   Procedure: REMOVAL OF PLEURAL DRAINAGE CATHETER;  Surgeon: Gaye Pollack, MD;  Location: [facility];  Service: Thoracic;  Laterality: Right;  TO BE DONE IN MINOR ROOM, [place] HIP ARTHROPLASTY Left 06/07/2015   Procedure: LEFT TOTAL HIP ARTHROPLASTY ANTERIOR APPROACH;  Surgeon: Gaynelle Arabian, MD;  Location: [facility];  Service: Orthopedics;  Laterality: Left;   VIDEO BRONCHOSCOPY  09/11/2011   Procedure: VIDEO BRONCHOSCOPY;  Surgeon: Gaye Pollack, MD;  Location: [facility];  Service: Thoracic;  Laterality: N/A;    SOCIAL HISTORY: Social History   Socioeconomic History   Marital status: Single    Spouse name: Not on file   Number of children: Not on file   Years of education: Not on file   Highest education level: Not on file  Occupational History   Not on file  Tobacco Use   Smoking status: Former  Packs/day: 1.50    Years: 30.00    Total pack years: 45.00    Types: Cigarettes    Quit date: 09/10/2007    Years since quitting: 14.7   Smokeless tobacco: Never  Substance and Sexual Activity   Alcohol use: No   Drug use: No   Sexual activity: Not Currently  Other Topics Concern   Not on file  Social History Narrative   Not on file   Social Determinants of Health   Financial Resource Strain: Not on file  Food Insecurity: Not on file  Transportation Needs: Not on file  Physical Activity: Not on file  Stress: Not on file  Social Connections: Not on file  Intimate Partner Violence: Not on file    FAMILY HISTORY: Family History  Problem Relation Age  of Onset   Cancer Mother        breast   Heart disease Father    Diabetes Other    Anesthesia problems Neg Hx     Review of Systems  Constitutional:  Negative for appetite change, chills, fatigue, fever and unexpected weight change.  HENT:   Negative for hearing loss, lump/mass and trouble swallowing.   Eyes:  Negative for eye problems and icterus.  Respiratory:  Negative for chest tightness, cough and shortness of breath.   Cardiovascular:  Negative for chest pain, leg swelling and palpitations.  Gastrointestinal:  Negative for abdominal distention, abdominal pain, constipation, diarrhea, nausea and vomiting.  Endocrine: Negative for hot flashes.  Genitourinary:  Negative for difficulty urinating.   Musculoskeletal:  Negative for arthralgias.  Skin:  Negative for itching and rash.  Neurological:  Negative for dizziness, extremity weakness, headaches and numbness.  Hematological:  Negative for adenopathy. Does not bruise/bleed easily.  Psychiatric/Behavioral:  Negative for depression. The patient is not nervous/anxious.     PHYSICAL EXAMINATION  ECOG PERFORMANCE STATUS: 1 - Symptomatic but completely ambulatory  Vitals:   06/13/22 1046  BP: 117/73  Pulse: (!) 110  Resp: 18  Temp: (!) 97.2 F (36.2 C)  SpO2: 98%    Physical Exam Constitutional:      General: She is not in acute distress.    Appearance: Normal appearance. She is not toxic-appearing.  HENT:     Head: Normocephalic and atraumatic.  Eyes:     General: No scleral icterus. Cardiovascular:     Rate and Rhythm: Normal rate and regular rhythm.     Pulses: Normal pulses.     Heart sounds: Normal heart sounds.  Pulmonary:     Effort: Pulmonary effort is normal.     Breath sounds: Normal breath sounds.  Abdominal:     General: Abdomen is flat. Bowel sounds are normal. There is no distension.     Palpations: Abdomen is soft.     Tenderness: There is no abdominal tenderness.  Musculoskeletal:        General:  No swelling.     Cervical back: Neck supple.  Lymphadenopathy:     Cervical: No cervical adenopathy.  Skin:    General: Skin is warm and dry.     Findings: No rash.  Neurological:     General: No focal deficit present.     Mental Status: She is alert.  Psychiatric:        Mood and Affect: Mood normal.        Behavior: Behavior normal.   Skin erythema and dryness around eyes  LABORATORY DATA:  CBC    Component Value Date/Time   WBC  9.2 06/13/2022 1024   WBC 10.0 02/26/2022 1123   RBC 3.78 (L) 06/13/2022 1024   HGB 12.8 06/13/2022 1024   HGB 14.1 07/08/2017 0942   HCT 37.7 06/13/2022 1024   HCT 42.9 07/08/2017 0942   PLT 366 06/13/2022 1024   PLT 283 07/08/2017 0942   MCV 99.7 06/13/2022 1024   MCV 81.1 07/08/2017 0942   MCH 33.9 06/13/2022 1024   MCHC 34.0 06/13/2022 1024   RDW 15.4 06/13/2022 1024   RDW 14.6 (H) 07/08/2017 0942   LYMPHSABS 0.7 06/13/2022 1024   LYMPHSABS 1.2 07/08/2017 0942   MONOABS 1.2 (H) 06/13/2022 1024   MONOABS 0.7 07/08/2017 0942   EOSABS 0.2 06/13/2022 1024   EOSABS 0.1 07/08/2017 0942   BASOSABS 0.1 06/13/2022 1024   BASOSABS 0.0 07/08/2017 0942    CMP     Component Value Date/Time   NA 138 06/13/2022 1024   NA 139 07/08/2017 0942   K 4.3 06/13/2022 1024   K 3.8 07/08/2017 0942   CL 105 06/13/2022 1024   CL 109 (H) 12/31/2012 0940   CO2 23 06/13/2022 1024   CO2 18 (L) 07/08/2017 0942   GLUCOSE 139 (H) 06/13/2022 1024   GLUCOSE 156 (H) 07/08/2017 0942   GLUCOSE 127 (H) 12/31/2012 0940   BUN 20 06/13/2022 1024   BUN 17.7 07/08/2017 0942   CREATININE 1.15 (H) 06/13/2022 1024   CREATININE 1.0 07/08/2017 0942   CALCIUM 10.3 06/13/2022 1024   CALCIUM 10.4 07/08/2017 0942   PROT 7.9 06/13/2022 1024   PROT 7.7 07/08/2017 0942   ALBUMIN 4.3 06/13/2022 1024   ALBUMIN 3.4 (L) 07/08/2017 0942   AST 15 06/13/2022 1024   AST 54 (H) 07/08/2017 0942   ALT 8 06/13/2022 1024   ALT 39 07/08/2017 0942   ALKPHOS 128 (H) 06/13/2022 1024    ALKPHOS 232 (H) 07/08/2017 0942   BILITOT 0.3 06/13/2022 1024   BILITOT 0.42 07/08/2017 0942   GFRNONAA 51 (L) 06/13/2022 1024   GFRAA 49 (L) 04/05/2020 1504   GFRAA 54 (L) 12/25/2017 0959       ASSESSMENT and THERAPY PLAN:   Carcinoma of right breast metastatic to lung Nasirah is a 71 year old woman with history of metastatic breast cancer here today for follow-up and evaluation prior to receiving treatment with Enhertu.  Her most recent echocardiogram was normal and her labs are stable she will proceed with treatment today.  We reviewed the skin around her eyes and I have recommended that she get Aquaphor to apply as needed and wrote for triamcinolone ointment to apply only to the skin not to get into the eye twice a day.  I offered to take a break for a week from chemotherapy and return next week to see if the eyes are better however she really does not want to do that.  I reviewed the above with Dr. Lindi Adie who is in agreement.  She will return in 3 weeks for labs, follow-up, and her next appointment.    All questions were answered. The patient knows to call the clinic with any problems, questions or concerns. We can certainly see the patient much sooner if necessary.  Total encounter time:30 minutes*in face-to-face visit time, chart review, lab review, care coordination, order entry, and documentation of the encounter time.    Wilber Bihari, NP 06/13/22 11:55 AM [facility] [place], [place] [address] Tel. [phone]    Fax. [phone]  *Total Encounter Time  as defined by the Centers for Medicare and Medicaid Services includes, in addition to the face-to-face time of a patient visit (documented in the note above) non-face-to-face time: obtaining and reviewing outside history, ordering and reviewing medications, tests or procedures, care coordination (communications with other health care professionals or caregivers)  and documentation in the medical record.

## 2022-06-15 ENCOUNTER — Other Ambulatory Visit: Payer: Self-pay

## 2022-06-17 ENCOUNTER — Telehealth: Payer: Self-pay | Admitting: Adult Health

## 2022-06-17 NOTE — Telephone Encounter (Signed)
Scheduled appointments per 11/16 los. Left voicemail.

## 2022-06-18 ENCOUNTER — Other Ambulatory Visit: Payer: Self-pay

## 2022-06-21 ENCOUNTER — Other Ambulatory Visit: Payer: Self-pay

## 2022-06-27 ENCOUNTER — Other Ambulatory Visit: Payer: Self-pay

## 2022-06-29 NOTE — Progress Notes (Signed)
Patient Care Team: Saintclair Halsted, FNP as PCP - General (Family Medicine) Jerline Pain, MD (Cardiology) Gaye Pollack, MD (Cardiothoracic Surgery) Raina Mina, RPH-CPP (Pharmacist) Renaldo Harrison, MD as Referring Physician (Radiation Oncology) Nicholas Lose, MD as Consulting Physician (Hematology and Oncology)  DIAGNOSIS: No diagnosis found.  SUMMARY OF ONCOLOGIC HISTORY: Oncology History  Carcinoma of right breast metastatic to lung ([place])  10/10/2011 Initial Diagnosis   Carcinoma of right breast metastatic to lung ([place])   05/02/2022 -  Chemotherapy   Patient is on Treatment Plan : BREAST METASTATIC Fam-Trastuzumab Deruxtecan-nxki (Enhertu) (5.4) q21d     Malignant neoplasm of upper-outer quadrant of right breast in female, estrogen receptor positive ([place])  03/22/2004 Initial Diagnosis   right mastectomy and sentinel lymph node sampling 03/22/2004 for a right upper-outer quadrant pT2 pN1, stage IIB invasive ductal carcinoma with lobular features, grade 2, estrogen receptor and progesterone receptor positive, HER-2 negative, with an MIB-1 of 9%   05/2004 - 09/18/2004 Chemotherapy   adjuvant chemotherapy with dose dense doxorubicin and cyclophosphamide x4 cycles (first cycle delayed one week) followed by dose dense paclitaxel x4 was completed 09/18/2004   09/2004 - 2009 Anti-estrogen oral therapy   tamoxifen started March 2006, discontinued 2009   08/2011 Relapse/Recurrence   Metastatic disease:large pericardial effusion, large right pleural effusion and possible right middle lobe bronchial obstruction  status post pericardial window placement, fiberoptic bronchoscopy and right Pleurx placement 09/11/2011, with biopsy of the bronchus intermedius and pericardium positive for metastatic breast cancer, ER 91%, PR 100% p with an MIB-1 of 35%, and no HER-2 amplification, ratio being 1.37   10/10/2011 - 07/2019 Anti-estrogen oral therapy   BOLERO-4 trial 10/10/2011, receiving  letrozole and everolimus (stopped for progression)   08/16/2019 Miscellaneous   foundation 1 requested on liver biopsy from 08/16/2019 shows a stable microsatellite status with 1 mutation/MB, amplification of C11orf30, FGF10 and RICTOR, with mutations in ESR1 and PTEN   08/16/2019 Miscellaneous   Liver biopsy: Metastatic carcinoma to the liver consistent with breast primary, ER 100%, PR 100%, Ki-67 5%, HER2 2+ by IHC, FISH HER2 negative   08/24/2019 -  Anti-estrogen oral therapy   fulvestrant started 08/24/2019, repeated every 4 weeks Palbociclib 125 mg daily 21 days on 7 days off started 08/25/2019 Switched to ribociclib s starting 08/22/2020   02/28/2021 -  Radiation Therapy   palliative radiation (Dr. Lynnette Caffey in [place]) 02/28/2021 to right hip area   10/27/2021 -  Anti-estrogen oral therapy   Abemaciclib with fulvestrant   01/14/2022 Miscellaneous   Guardant360: ESR 1 mutation (no other mutations) MSI high was not detected, PTEN, T p53, EGFR amplification, RB1 mutations detected without any approved treatment options     CHIEF COMPLIANT: Follow-up Metastatic carcinoma cycle 3 Enhertu   INTERVAL HISTORY: Barbara Parks is a  71 year old above-mentioned with metastatic breast cancer who was switched from ribociclib to abemaciclib. She presents to the clinic today for cycle 3 Enhertu.     ALLERGIES:  is allergic to aspirin, latex, and nsaids.  MEDICATIONS:  Current Outpatient Medications  Medication Sig Dispense Refill   cholecalciferol (VITAMIN D3) 25 MCG (1000 UT) tablet Take 2 tablets (2,000 Units total) by mouth daily. (Patient not taking: Reported on 06/13/2022) 180 tablet 4   gemfibrozil (LOPID) 600 MG tablet TAKE 1 TABLET (600 MG TOTAL) BY MOUTH 2 (TWO) TIMES DAILY BEFORE A MEAL. 180 tablet 2   lidocaine-prilocaine (EMLA) cream Apply to affected area once 30 g 3   loperamide (  IMODIUM) 2 MG capsule Take 2 mg by mouth 4 (four) times daily as needed for diarrhea or loose stools.  Reported on 11/30/2015     metFORMIN (GLUCOPHAGE-XR) 500 MG 24 hr tablet Take 1 tablet (500 mg total) by mouth daily with supper. Followed by [facility]  3   omeprazole (PRILOSEC OTC) 20 MG tablet Take 20 mg by mouth daily as needed (acid reflux).     ondansetron (ZOFRAN) 8 MG tablet Take 1 tablet (8 mg total) by mouth every 8 (eight) hours as needed for nausea or vomiting. Start on the third day after chemotherapy. (Patient not taking: Reported on 06/13/2022) 30 tablet 1   prochlorperazine (COMPAZINE) 10 MG tablet Take 1 tablet (10 mg total) by mouth every 6 (six) hours as needed for nausea or vomiting. 30 tablet 1   triamcinolone (KENALOG) 0.025 % ointment Apply 1 Application topically 2 (two) times daily. 15 g 0   No current facility-administered medications for this visit.    PHYSICAL EXAMINATION: ECOG PERFORMANCE STATUS: {CHL ONC ECOG PS:[id number]}  There were no vitals filed for this visit. There were no vitals filed for this visit.  BREAST:*** No palpable masses or nodules in either right or left breasts. No palpable axillary supraclavicular or infraclavicular adenopathy no breast tenderness or nipple discharge. (exam performed in the presence of a chaperone)  LABORATORY DATA:  I have reviewed the data as listed    Latest Ref Rng & Units 06/13/2022   10:24 AM 05/23/2022   10:47 AM 05/02/2022   12:58 PM  CMP  Glucose 70 - 99 mg/dL 139  96  108   BUN 8 - 23 mg/dL _0 Creatinine 0.44 - 1.00 mg/dL 1.15  1.01  1.31   Sodium 135 - 145 mmol/L 138  136  135   Potassium 3.5 - 5.1 mmol/L 4.3  4.2  4.5   Chloride 98 - 111 mmol/L 105  104  105   CO2 22 - 32 mmol/L _1 Calcium 8.9 - 10.3 mg/dL 10.3  10.3  10.1   Total Protein 6.5 - 8.1 g/dL 7.9  7.7  8.2   Total Bilirubin 0.3 - 1.2 mg/dL 0.3  0.3  0.3   Alkaline Phos 38 - 126 U/L 128  116  94   AST 15 - 41 U/L _2 ALT 0 - 44 U/L _3 Lab Results  Component Value Date   WBC 9.2 06/13/2022    HGB 12.8 06/13/2022   HCT 37.7 06/13/2022   MCV 99.7 06/13/2022   PLT 366 06/13/2022   NEUTROABS 6.8 06/13/2022    ASSESSMENT & PLAN:  No problem-specific Assessment & Plan notes found for this encounter.    No orders of the defined types were placed in this encounter.  The patient has a good understanding of the overall plan. she agrees with it. she will call with any problems that may develop before the next visit here. Total time spent: 30 mins including face to face time and time spent for planning, charting and co-ordination of care   Suzzette Righter, [place] 06/29/22    I Gardiner Coins am scribing for Dr. Lindi Adie  ***

## 2022-07-03 MED FILL — Dexamethasone Sodium Phosphate Inj 100 MG/10ML: INTRAMUSCULAR | Qty: 1 | Status: AC

## 2022-07-03 MED FILL — Fosaprepitant Dimeglumine For IV Infusion 150 MG (Base Eq): INTRAVENOUS | Qty: 5 | Status: AC

## 2022-07-04 ENCOUNTER — Inpatient Hospital Stay (HOSPITAL_BASED_OUTPATIENT_CLINIC_OR_DEPARTMENT_OTHER): Payer: Medicare Other | Admitting: Hematology and Oncology

## 2022-07-04 ENCOUNTER — Inpatient Hospital Stay: Payer: Medicare Other

## 2022-07-04 ENCOUNTER — Inpatient Hospital Stay: Payer: Medicare Other | Attending: Oncology

## 2022-07-04 VITALS — HR 95

## 2022-07-04 VITALS — BP 111/66 | HR 109 | Temp 97.2°F | Resp 18 | Ht 64.0 in | Wt 144.2 lb

## 2022-07-04 DIAGNOSIS — Z5111 Encounter for antineoplastic chemotherapy: Secondary | ICD-10-CM | POA: Insufficient documentation

## 2022-07-04 DIAGNOSIS — C7800 Secondary malignant neoplasm of unspecified lung: Secondary | ICD-10-CM | POA: Insufficient documentation

## 2022-07-04 DIAGNOSIS — Z79899 Other long term (current) drug therapy: Secondary | ICD-10-CM | POA: Insufficient documentation

## 2022-07-04 DIAGNOSIS — C7951 Secondary malignant neoplasm of bone: Secondary | ICD-10-CM | POA: Insufficient documentation

## 2022-07-04 DIAGNOSIS — Z170 Estrogen receptor positive status [ER+]: Secondary | ICD-10-CM

## 2022-07-04 DIAGNOSIS — C50411 Malignant neoplasm of upper-outer quadrant of right female breast: Secondary | ICD-10-CM | POA: Insufficient documentation

## 2022-07-04 DIAGNOSIS — C787 Secondary malignant neoplasm of liver and intrahepatic bile duct: Secondary | ICD-10-CM | POA: Insufficient documentation

## 2022-07-04 DIAGNOSIS — C50911 Malignant neoplasm of unspecified site of right female breast: Secondary | ICD-10-CM

## 2022-07-04 DIAGNOSIS — Z95828 Presence of other vascular implants and grafts: Secondary | ICD-10-CM

## 2022-07-04 LAB — CMP (CANCER CENTER ONLY)
ALT: 8 U/L (ref 0–44)
AST: 14 U/L — ABNORMAL LOW (ref 15–41)
Albumin: 4 g/dL (ref 3.5–5.0)
Alkaline Phosphatase: 122 U/L (ref 38–126)
Anion gap: 8 (ref 5–15)
BUN: 17 mg/dL (ref 8–23)
CO2: 21 mmol/L — ABNORMAL LOW (ref 22–32)
Calcium: 9.8 mg/dL (ref 8.9–10.3)
Chloride: 109 mmol/L (ref 98–111)
Creatinine: 0.78 mg/dL (ref 0.44–1.00)
GFR, Estimated: >60 mL/min (ref >60)
Glucose, Bld: 120 mg/dL — ABNORMAL HIGH (ref 70–99)
Potassium: 4.1 mmol/L (ref 3.5–5.1)
Sodium: 138 mmol/L (ref 135–145)
Total Bilirubin: 0.2 mg/dL — ABNORMAL LOW (ref 0.3–1.2)
Total Protein: 7.3 g/dL (ref 6.5–8.1)

## 2022-07-04 LAB — CBC WITH DIFFERENTIAL (CANCER CENTER ONLY)
Abs Immature Granulocytes: 0.13 10*3/uL — ABNORMAL HIGH (ref 0.00–0.07)
Basophils Absolute: 0.1 10*3/uL (ref 0.0–0.1)
Basophils Relative: 1 %
Eosinophils Absolute: 0.2 10*3/uL (ref 0.0–0.5)
Eosinophils Relative: 2 %
HCT: 37.2 % (ref 36.0–46.0)
Hemoglobin: 12.7 g/dL (ref 12.0–15.0)
Immature Granulocytes: 1 %
Lymphocytes Relative: 8 %
Lymphs Abs: 0.7 10*3/uL (ref 0.7–4.0)
MCH: 34.5 pg — ABNORMAL HIGH (ref 26.0–34.0)
MCHC: 34.1 g/dL (ref 30.0–36.0)
MCV: 101.1 fL — ABNORMAL HIGH (ref 80.0–100.0)
Monocytes Absolute: 1.3 10*3/uL — ABNORMAL HIGH (ref 0.1–1.0)
Monocytes Relative: 15 %
Neutro Abs: 6.7 10*3/uL (ref 1.7–7.7)
Neutrophils Relative %: 73 %
Platelet Count: 422 10*3/uL — ABNORMAL HIGH (ref 150–400)
RBC: 3.68 MIL/uL — ABNORMAL LOW (ref 3.87–5.11)
RDW: 16 % — ABNORMAL HIGH (ref 11.5–15.5)
WBC Count: 9.2 10*3/uL (ref 4.0–10.5)
nRBC: 0 % (ref 0.0–0.2)

## 2022-07-04 MED ORDER — PALONOSETRON HCL INJECTION 0.25 MG/5ML
0.2500 mg | Freq: Once | INTRAVENOUS | Status: AC
  Administered 2022-07-04: 0.25 mg via INTRAVENOUS
  Filled 2022-07-04: qty 5

## 2022-07-04 MED ORDER — SODIUM CHLORIDE 0.9% FLUSH
10.0000 mL | INTRAVENOUS | Status: DC | PRN
  Administered 2022-07-04: 10 mL

## 2022-07-04 MED ORDER — SODIUM CHLORIDE 0.9% FLUSH
10.0000 mL | Freq: Once | INTRAVENOUS | Status: AC
  Administered 2022-07-04: 10 mL

## 2022-07-04 MED ORDER — DEXTROSE 5 % IV SOLN: Freq: Once | INTRAVENOUS | Status: AC

## 2022-07-04 MED ORDER — HEPARIN SOD (PORK) LOCK FLUSH 100 UNIT/ML IV SOLN
500.0000 [IU] | Freq: Once | INTRAVENOUS | Status: AC | PRN
  Administered 2022-07-04: 500 [IU]

## 2022-07-04 MED ORDER — AMOXICILLIN 500 MG PO TABS: 500.0000 mg | ORAL_TABLET | Freq: Two times a day (BID) | ORAL | 0 refills | Status: DC

## 2022-07-04 MED ORDER — FAM-TRASTUZUMAB DERUXTECAN-NXKI CHEMO 100 MG IV SOLR
4.4200 mg/kg | Freq: Once | INTRAVENOUS | Status: AC
  Administered 2022-07-04: 300 mg via INTRAVENOUS
  Filled 2022-07-04: qty 15

## 2022-07-04 MED ORDER — SODIUM CHLORIDE 0.9 % IV SOLN
10.0000 mg | Freq: Once | INTRAVENOUS | Status: AC
  Administered 2022-07-04: 10 mg via INTRAVENOUS
  Filled 2022-07-04: qty 10

## 2022-07-04 MED ORDER — ACETAMINOPHEN 325 MG PO TABS
650.0000 mg | ORAL_TABLET | Freq: Once | ORAL | Status: AC
  Administered 2022-07-04: 650 mg via ORAL
  Filled 2022-07-04: qty 2

## 2022-07-04 MED ORDER — DIPHENHYDRAMINE HCL 25 MG PO CAPS
50.0000 mg | ORAL_CAPSULE | Freq: Once | ORAL | Status: AC
  Administered 2022-07-04: 50 mg via ORAL
  Filled 2022-07-04: qty 2

## 2022-07-04 MED ORDER — SODIUM CHLORIDE 0.9 % IV SOLN
150.0000 mg | Freq: Once | INTRAVENOUS | Status: AC
  Administered 2022-07-04: 150 mg via INTRAVENOUS
  Filled 2022-07-04: qty 150

## 2022-07-04 NOTE — Assessment & Plan Note (Addendum)
Metastatic breast cancer (pericardial effusion, pleural effusion, right middle lobe lung lesion, ER 91%, PR 90%, Ki-67 35%, HER2 negative diagnosed February 2023)   (Prior history of right mastectomy 2005, adjuvant chemo with Adriamycin Cytoxan and Taxol followed by tamoxifen)   Current treatment:  1. Ribociclib, fulvestrant, Xgeva, ribociclib discontinued 10/18/2021  2. abemaciclib started 10/27/2021 Foundation 1 in 2021: MSI stable, TMB 1, ESR 1 mutation, PTEN, FGF 10, RICTOR amplifications   Bone scan: Uptake in multiple sites of bone metastatic disease corresponding to site seen on previous exam as well as T10   Liver MRI 01/12/2022: Progression of metastatic disease in the liver with increased number and size of hepatic metastases, widespread metastatic disease to the bones, moderate pleural effusion   Guardant360 01/14/2022: No actionable mutations.  ESR 1 mutation. Liver biopsy 02/26/2022: Metastatic carcinoma consistent with breast primary. Caris mutation testing 02/26/2022: HER2 low, ESR 1 mutation, PTEN   Treatment plan: Enhertu every 3 weeks, today cycle 4 She has a new puppy named Ricky.  (4 and half months old)   Enhertu toxicities: Fatigue that lasted for 4 to 5 days M nausea and vomiting: She threw up several times after last treatment.  However she thinks it is because of her diet.  She thinks she has made the changes so she is not nauseated anymore.   We will perform CT chest abdomen pelvis with contrast prior to her next treatment appointment.  Return to clinic every 3 weeks for Enhertu  

## 2022-07-05 LAB — CANCER ANTIGEN 27.29: CA 27.29: 285.2 U/mL — ABNORMAL HIGH (ref 0.0–38.6)

## 2022-07-24 ENCOUNTER — Encounter (HOSPITAL_COMMUNITY): Payer: Self-pay

## 2022-07-24 ENCOUNTER — Ambulatory Visit (HOSPITAL_COMMUNITY)
Admission: RE | Admit: 2022-07-24 | Discharge: 2022-07-24 | Disposition: A | Discharge status: Home / Self Care | Payer: Medicare Other | Source: Ambulatory Visit | Attending: Hematology and Oncology | Admitting: Hematology and Oncology

## 2022-07-24 DIAGNOSIS — C50411 Malignant neoplasm of upper-outer quadrant of right female breast: Secondary | ICD-10-CM | POA: Diagnosis not present

## 2022-07-24 DIAGNOSIS — Z170 Estrogen receptor positive status [ER+]: Secondary | ICD-10-CM | POA: Diagnosis not present

## 2022-07-24 DIAGNOSIS — C7800 Secondary malignant neoplasm of unspecified lung: Secondary | ICD-10-CM | POA: Diagnosis not present

## 2022-07-24 DIAGNOSIS — K7689 Other specified diseases of liver: Secondary | ICD-10-CM | POA: Diagnosis not present

## 2022-07-24 DIAGNOSIS — C50911 Malignant neoplasm of unspecified site of right female breast: Secondary | ICD-10-CM | POA: Diagnosis not present

## 2022-07-24 DIAGNOSIS — C50919 Malignant neoplasm of unspecified site of unspecified female breast: Secondary | ICD-10-CM | POA: Diagnosis not present

## 2022-07-24 DIAGNOSIS — J90 Pleural effusion, not elsewhere classified: Secondary | ICD-10-CM | POA: Diagnosis not present

## 2022-07-24 MED ORDER — IOHEXOL 300 MG/ML  SOLN
100.0000 mL | Freq: Once | INTRAMUSCULAR | Status: AC | PRN
  Administered 2022-07-24: 100 mL via INTRAVENOUS

## 2022-07-24 MED ORDER — SODIUM CHLORIDE (PF) 0.9 % IJ SOLN
INTRAMUSCULAR | Status: AC
  Filled 2022-07-24: qty 50

## 2022-07-25 MED FILL — Fosaprepitant Dimeglumine For IV Infusion 150 MG (Base Eq): INTRAVENOUS | Qty: 5 | Status: AC

## 2022-07-25 MED FILL — Dexamethasone Sodium Phosphate Inj 100 MG/10ML: INTRAMUSCULAR | Qty: 1 | Status: AC

## 2022-07-26 ENCOUNTER — Inpatient Hospital Stay (HOSPITAL_BASED_OUTPATIENT_CLINIC_OR_DEPARTMENT_OTHER): Payer: Medicare Other | Admitting: Adult Health

## 2022-07-26 ENCOUNTER — Inpatient Hospital Stay: Payer: Medicare Other

## 2022-07-26 ENCOUNTER — Encounter: Payer: Self-pay | Admitting: Adult Health

## 2022-07-26 VITALS — HR 91

## 2022-07-26 VITALS — BP 114/75 | HR 110 | Temp 97.5°F | Resp 18 | Ht 64.0 in | Wt 143.8 lb

## 2022-07-26 DIAGNOSIS — C7800 Secondary malignant neoplasm of unspecified lung: Secondary | ICD-10-CM | POA: Diagnosis not present

## 2022-07-26 DIAGNOSIS — Z79899 Other long term (current) drug therapy: Secondary | ICD-10-CM | POA: Diagnosis not present

## 2022-07-26 DIAGNOSIS — Z08 Encounter for follow-up examination after completed treatment for malignant neoplasm: Secondary | ICD-10-CM

## 2022-07-26 DIAGNOSIS — J90 Pleural effusion, not elsewhere classified: Secondary | ICD-10-CM

## 2022-07-26 DIAGNOSIS — C787 Secondary malignant neoplasm of liver and intrahepatic bile duct: Secondary | ICD-10-CM | POA: Diagnosis not present

## 2022-07-26 DIAGNOSIS — Z95828 Presence of other vascular implants and grafts: Secondary | ICD-10-CM

## 2022-07-26 DIAGNOSIS — C50911 Malignant neoplasm of unspecified site of right female breast: Secondary | ICD-10-CM

## 2022-07-26 DIAGNOSIS — Z5111 Encounter for antineoplastic chemotherapy: Secondary | ICD-10-CM | POA: Diagnosis not present

## 2022-07-26 DIAGNOSIS — Z170 Estrogen receptor positive status [ER+]: Secondary | ICD-10-CM

## 2022-07-26 DIAGNOSIS — C50411 Malignant neoplasm of upper-outer quadrant of right female breast: Secondary | ICD-10-CM | POA: Diagnosis not present

## 2022-07-26 DIAGNOSIS — C7951 Secondary malignant neoplasm of bone: Secondary | ICD-10-CM | POA: Diagnosis not present

## 2022-07-26 LAB — CMP (CANCER CENTER ONLY)
ALT: 8 U/L (ref 0–44)
AST: 13 U/L — ABNORMAL LOW (ref 15–41)
Albumin: 3.9 g/dL (ref 3.5–5.0)
Alkaline Phosphatase: 101 U/L (ref 38–126)
Anion gap: 8 (ref 5–15)
BUN: 16 mg/dL (ref 8–23)
CO2: 23 mmol/L (ref 22–32)
Calcium: 9.6 mg/dL (ref 8.9–10.3)
Chloride: 106 mmol/L (ref 98–111)
Creatinine: 0.9 mg/dL (ref 0.44–1.00)
GFR, Estimated: >60 mL/min (ref >60)
Glucose, Bld: 161 mg/dL — ABNORMAL HIGH (ref 70–99)
Potassium: 4.1 mmol/L (ref 3.5–5.1)
Sodium: 137 mmol/L (ref 135–145)
Total Bilirubin: 0.3 mg/dL (ref 0.3–1.2)
Total Protein: 7.4 g/dL (ref 6.5–8.1)

## 2022-07-26 LAB — CBC WITH DIFFERENTIAL (CANCER CENTER ONLY)
Abs Immature Granulocytes: 0.06 10*3/uL (ref 0.00–0.07)
Basophils Absolute: 0.1 10*3/uL (ref 0.0–0.1)
Basophils Relative: 1 %
Eosinophils Absolute: 0.2 10*3/uL (ref 0.0–0.5)
Eosinophils Relative: 2 %
HCT: 37.6 % (ref 36.0–46.0)
Hemoglobin: 12.5 g/dL (ref 12.0–15.0)
Immature Granulocytes: 1 %
Lymphocytes Relative: 8 %
Lymphs Abs: 0.7 10*3/uL (ref 0.7–4.0)
MCH: 33.6 pg (ref 26.0–34.0)
MCHC: 33.2 g/dL (ref 30.0–36.0)
MCV: 101.1 fL — ABNORMAL HIGH (ref 80.0–100.0)
Monocytes Absolute: 1.1 10*3/uL — ABNORMAL HIGH (ref 0.1–1.0)
Monocytes Relative: 13 %
Neutro Abs: 6.4 10*3/uL (ref 1.7–7.7)
Neutrophils Relative %: 75 %
Platelet Count: 414 10*3/uL — ABNORMAL HIGH (ref 150–400)
RBC: 3.72 MIL/uL — ABNORMAL LOW (ref 3.87–5.11)
RDW: 15.9 % — ABNORMAL HIGH (ref 11.5–15.5)
WBC Count: 8.4 10*3/uL (ref 4.0–10.5)
nRBC: 0 % (ref 0.0–0.2)

## 2022-07-26 MED ORDER — HEPARIN SOD (PORK) LOCK FLUSH 100 UNIT/ML IV SOLN
500.0000 [IU] | Freq: Once | INTRAVENOUS | Status: AC | PRN
  Administered 2022-07-26: 500 [IU]

## 2022-07-26 MED ORDER — SODIUM CHLORIDE 0.9% FLUSH
10.0000 mL | INTRAVENOUS | Status: DC | PRN
  Administered 2022-07-26: 10 mL

## 2022-07-26 MED ORDER — SODIUM CHLORIDE 0.9 % IV SOLN
150.0000 mg | Freq: Once | INTRAVENOUS | Status: AC
  Administered 2022-07-26: 150 mg via INTRAVENOUS
  Filled 2022-07-26: qty 150

## 2022-07-26 MED ORDER — SODIUM CHLORIDE 0.9 % IV SOLN
10.0000 mg | Freq: Once | INTRAVENOUS | Status: AC
  Administered 2022-07-26: 10 mg via INTRAVENOUS
  Filled 2022-07-26: qty 10

## 2022-07-26 MED ORDER — PALONOSETRON HCL INJECTION 0.25 MG/5ML
0.2500 mg | Freq: Once | INTRAVENOUS | Status: AC
  Administered 2022-07-26: 0.25 mg via INTRAVENOUS
  Filled 2022-07-26: qty 5

## 2022-07-26 MED ORDER — DEXTROSE 5 % IV SOLN: Freq: Once | INTRAVENOUS | Status: AC

## 2022-07-26 MED ORDER — ACETAMINOPHEN 325 MG PO TABS
650.0000 mg | ORAL_TABLET | Freq: Once | ORAL | Status: AC
  Administered 2022-07-26: 650 mg via ORAL
  Filled 2022-07-26: qty 2

## 2022-07-26 MED ORDER — DIPHENHYDRAMINE HCL 25 MG PO CAPS
50.0000 mg | ORAL_CAPSULE | Freq: Once | ORAL | Status: AC
  Administered 2022-07-26: 50 mg via ORAL
  Filled 2022-07-26: qty 2

## 2022-07-26 MED ORDER — FAM-TRASTUZUMAB DERUXTECAN-NXKI CHEMO 100 MG IV SOLR
4.4200 mg/kg | Freq: Once | INTRAVENOUS | Status: AC
  Administered 2022-07-26: 300 mg via INTRAVENOUS
  Filled 2022-07-26: qty 15

## 2022-07-26 MED ORDER — SODIUM CHLORIDE 0.9% FLUSH
10.0000 mL | Freq: Once | INTRAVENOUS | Status: AC
  Administered 2022-07-26: 10 mL

## 2022-07-26 NOTE — Patient Instructions (Signed)
[facility]  Discharge Instructions: Thank you for choosing [facility] to provide your oncology and hematology care.   If you have a lab appointment with the [facility], please go directly to the [facility] and check in at the registration area.   Wear comfortable clothing and clothing appropriate for easy access to any Portacath or PICC line.   We strive to give you quality time with your provider. You may need to reschedule your appointment if you arrive late (15 or more minutes).  Arriving late affects you and other patients whose appointments are after yours.  Also, if you miss three or more appointments without notifying the office, you may be dismissed from the clinic at the provider's discretion.      For prescription refill requests, have your pharmacy contact our office and allow 72 hours for refills to be completed.    Today you received the following chemotherapy and/or immunotherapy agent: Fam-Trastuzumab (Enhertu)   To help prevent nausea and vomiting after your treatment, we encourage you to take your nausea medication as directed.  BELOW ARE SYMPTOMS THAT SHOULD BE REPORTED IMMEDIATELY: *FEVER GREATER THAN 100.4 F (38 C) OR HIGHER *CHILLS OR SWEATING *NAUSEA AND VOMITING THAT IS NOT CONTROLLED WITH YOUR NAUSEA MEDICATION *UNUSUAL SHORTNESS OF BREATH *UNUSUAL BRUISING OR BLEEDING *URINARY PROBLEMS (pain or burning when urinating, or frequent urination) *BOWEL PROBLEMS (unusual diarrhea, constipation, pain near the anus) TENDERNESS IN MOUTH AND THROAT WITH OR WITHOUT PRESENCE OF ULCERS (sore throat, sores in mouth, or a toothache) UNUSUAL RASH, SWELLING OR PAIN  UNUSUAL VAGINAL DISCHARGE OR ITCHING   Items with * indicate a potential emergency and should be followed up as soon as possible or go to the Emergency Department if any problems should occur.  Please show the CHEMOTHERAPY ALERT CARD or IMMUNOTHERAPY ALERT CARD at  check-in to the Emergency Department and triage nurse.  Should you have questions after your visit or need to cancel or reschedule your appointment, please contact [facility]  Dept: [phone]  and follow the prompts.  Office hours are 8:00 a.m. to 4:30 p.m. Monday - Friday. Please note that voicemails left after 4:00 p.m. may not be returned until the following business day.  We are closed weekends and major holidays. You have access to a nurse at all times for urgent questions. Please call the main number to the clinic Dept: [phone] and follow the prompts.   For any non-urgent questions, you may also contact your provider using MyChart. We now offer e-Visits for anyone 57 and older to request care online for non-urgent symptoms. For details visit [email or web address].   Also download the MyChart app! Go to the app store, search "MyChart", open the app, select [facility], and log in with your MyChart username and password. Fam-Trastuzumab Deruxtecan Injection What is this medication? FAM-TRASTUZUMAB DERUXTECAN (fam-tras TOOZ eu mab DER ux TEE kan) treats some types of cancer. It works by blocking a protein that causes cancer cells to grow and multiply. This helps to slow or stop the spread of cancer cells. This medicine may be used for other purposes; ask your health care provider or pharmacist if you have questions. COMMON BRAND NAME(S): ENHERTU What should I tell my care team before I take this medication? They need to know if you have any of these conditions: Heart disease Heart failure Infection, especially a viral infection, such as chickenpox, cold sores, or herpes Liver disease Lung or  breathing disease, such as asthma or COPD An unusual or allergic reaction to fam-trastuzumab deruxtecan, other medications, foods, dyes, or preservatives Pregnant or trying to get pregnant Breast-feeding How should I use this medication? This medication is  injected into a vein. It is given by your care team in a hospital or clinic setting. A special MedGuide will be given to you before each treatment. Be sure to read this information carefully each time. Talk to your care team about the use of this medication in children. Special care may be needed. Overdosage: If you think you have taken too much of this medicine contact a poison control center or emergency room at once. NOTE: This medicine is only for you. Do not share this medicine with others. What if I miss a dose? It is important not to miss your dose. Call your care team if you are unable to keep an appointment. What may interact with this medication? Interactions are not expected. This list may not describe all possible interactions. Give your health care provider a list of all the medicines, herbs, non-prescription drugs, or dietary supplements you use. Also tell them if you smoke, drink alcohol, or use illegal drugs. Some items may interact with your medicine. What should I watch for while using this medication? Visit your care team for regular checks on your progress. Tell your care team if your symptoms do not start to get better or if they get worse. Your condition will be monitored carefully while you are receiving this medication. Do not become pregnant while taking this medication or for 7 months after stopping it. Women should inform their care team if they wish to become pregnant or think they might be pregnant. Men should not father a child while taking this medication and for 4 months after stopping it. There is potential for serious side effects to an unborn child. Talk to your care team for more information. Do not breast-feed an infant while taking this medication or for 7 months after the last dose. This medication has caused decreased sperm counts in some men. This may make it more difficult to father a child. Talk to your care team if you are concerned about your  fertility. This medication may increase your risk to bruise or bleed. Call your care team if you notice any unusual bleeding. Be careful brushing or flossing your teeth or using a toothpick because you may get an infection or bleed more easily. If you have any dental work done, tell your dentist you are receiving this medication. This medication may cause dry eyes and blurred vision. If you wear contact lenses, you may feel some discomfort. Lubricating eye drops may help. See your care team if the problem does not go away or is severe. This medication may increase your risk of getting an infection. Call your care team for advice if you get a fever, chills, sore throat, or other symptoms of a cold or flu. Do not treat yourself. Try to avoid being around people who are sick. Avoid taking medications that contain aspirin, acetaminophen, ibuprofen, naproxen, or ketoprofen unless instructed by your care team. These medications may hide a fever. What side effects may I notice from receiving this medication? Side effects that you should report to your care team as soon as possible: Allergic reactions--skin rash, itching, hives, swelling of the face, lips, tongue, or throat Dry cough, shortness of breath or trouble breathing Infection--fever, chills, cough, sore throat, wounds that don't heal, pain or trouble when passing  urine, general feeling of discomfort or being unwell Heart failure--shortness of breath, swelling of the ankles, feet, or hands, sudden weight gain, unusual weakness or fatigue Unusual bruising or bleeding Side effects that usually do not require medical attention (report these to your care team if they continue or are bothersome): Constipation Diarrhea Hair loss Muscle pain Nausea Vomiting This list may not describe all possible side effects. Call your doctor for medical advice about side effects. You may report side effects to FDA at 1-800-FDA-1088. Where should I keep my  medication? This medication is given in a hospital or clinic. It will not be stored at home. NOTE: This sheet is a summary. It may not cover all possible information. If you have questions about this medicine, talk to your doctor, pharmacist, or health care provider.  2023 Elsevier/Gold Standard (2021-03-28 00:00:00)

## 2022-07-26 NOTE — Progress Notes (Incomplete)
SURVIVORSHIP VISIT:  BRIEF ONCOLOGIC HISTORY:  Oncology History  Carcinoma of right breast metastatic to lung ([place])  10/10/2011 Initial Diagnosis   Carcinoma of right breast metastatic to lung ([place])   05/02/2022 -  Chemotherapy   Patient is on Treatment Plan : BREAST METASTATIC Fam-Trastuzumab Deruxtecan-nxki (Enhertu) (5.4) q21d     Malignant neoplasm of upper-outer quadrant of right breast in female, estrogen receptor positive ([place])  03/22/2004 Initial Diagnosis   right mastectomy and sentinel lymph node sampling 03/22/2004 for a right upper-outer quadrant pT2 pN1, stage IIB invasive ductal carcinoma with lobular features, grade 2, estrogen receptor and progesterone receptor positive, HER-2 negative, with an MIB-1 of 9%   05/2004 - 09/18/2004 Chemotherapy   adjuvant chemotherapy with dose dense doxorubicin and cyclophosphamide x4 cycles (first cycle delayed one week) followed by dose dense paclitaxel x4 was completed 09/18/2004   09/2004 - 2009 Anti-estrogen oral therapy   tamoxifen started March 2006, discontinued 2009   08/2011 Relapse/Recurrence   Metastatic disease:large pericardial effusion, large right pleural effusion and possible right middle lobe bronchial obstruction  status post pericardial window placement, fiberoptic bronchoscopy and right Pleurx placement 09/11/2011, with biopsy of the bronchus intermedius and pericardium positive for metastatic breast cancer, ER 91%, PR 100% p with an MIB-1 of 35%, and no HER-2 amplification, ratio being 1.37   10/10/2011 - 07/2019 Anti-estrogen oral therapy   BOLERO-4 trial 10/10/2011, receiving letrozole and everolimus (stopped for progression)   08/16/2019 Miscellaneous   foundation 1 requested on liver biopsy from 08/16/2019 shows a stable microsatellite status with 1 mutation/MB, amplification of C11orf30, FGF10 and RICTOR, with mutations in ESR1 and PTEN   08/16/2019 Miscellaneous   Liver biopsy: Metastatic carcinoma to the liver  consistent with breast primary, ER 100%, PR 100%, Ki-67 5%, HER2 2+ by IHC, FISH HER2 negative   08/24/2019 -  Anti-estrogen oral therapy   fulvestrant started 08/24/2019, repeated every 4 weeks Palbociclib 125 mg daily 21 days on 7 days off started 08/25/2019 Switched to ribociclib s starting 08/22/2020   02/28/2021 -  Radiation Therapy   palliative radiation (Dr. Lynnette Caffey in [place]) 02/28/2021 to right hip area   10/27/2021 -  Anti-estrogen oral therapy   Abemaciclib with fulvestrant   01/14/2022 Miscellaneous   Guardant360: ESR 1 mutation (no other mutations) MSI high was not detected, PTEN, T p53, EGFR amplification, RB1 mutations detected without any approved treatment options     INTERVAL HISTORY:  Ms. Milligan to review her survivorship care plan detailing her treatment course for breast cancer, as well as monitoring long-term side effects of that treatment, education regarding health maintenance, screening, and overall wellness and health promotion.     Overall, Barbara Parks reports feeling quite well   REVIEW OF SYSTEMS:  Review of Systems  Constitutional:  Negative for appetite change, chills, fatigue, fever and unexpected weight change.  HENT:   Negative for hearing loss, lump/mass and trouble swallowing.   Eyes:  Negative for eye problems and icterus.  Respiratory:  Negative for chest tightness, cough and shortness of breath.   Cardiovascular:  Negative for chest pain, leg swelling and palpitations.  Gastrointestinal:  Negative for abdominal distention, abdominal pain, constipation, diarrhea, nausea and vomiting.  Endocrine: Negative for hot flashes.  Genitourinary:  Negative for difficulty urinating.   Musculoskeletal:  Negative for arthralgias.  Skin:  Negative for itching and rash.  Neurological:  Negative for dizziness, extremity weakness, headaches and numbness.  Hematological:  Negative for adenopathy. Does not bruise/bleed easily.  Psychiatric/Behavioral:  Negative for  depression. The patient is not nervous/anxious.    Breast: Denies any new nodularity, masses, tenderness, nipple changes, or nipple discharge.     PAST MEDICAL/SURGICAL HISTORY:  Past Medical History:  Diagnosis Date  . Arthritis    left hip  . Asthma   . breast ca 2005   breast/chemo R mastectomy  . Breast cancer ([place])   . Dermatitis   . Diabetes mellitus without complication ([place])   . GERD (gastroesophageal reflux disease)   . Hypercholesteremia   . Metastasis to lung ([place]) dx'd 08/2011  . Osteopenia due to cancer therapy 09/09/2013  . Palpitations   . Personal history of chemotherapy   . Shortness of breath    Past Surgical History:  Procedure Laterality Date  . BREAST SURGERY  2005   right  . CATARACT EXTRACTION W/PHACO Left 01/18/2014   Procedure: CATARACT EXTRACTION PHACO AND INTRAOCULAR LENS PLACEMENT (IOC);  Surgeon: Elta Guadeloupe T. Gershon Crane, MD;  Location: [facility];  Service: Ophthalmology;  Laterality: Left;  CDE 15.79  . CATARACT EXTRACTION W/PHACO Right 02/08/2014   Procedure: CATARACT EXTRACTION PHACO AND INTRAOCULAR LENS PLACEMENT (IOC);  Surgeon: Elta Guadeloupe T. Gershon Crane, MD;  Location: [facility];  Service: Ophthalmology;  Laterality: Right;  CDE 4.41  . CHEST TUBE INSERTION  09/11/2011   Procedure: INSERTION PLEURAL DRAINAGE CATHETER;  Surgeon: Gaye Pollack, MD;  Location: [facility];  Service: Thoracic;  Laterality: Right;  . IR IMAGING GUIDED PORT INSERTION  04/23/2022  . MASTECTOMY Right   . PERICARDIAL WINDOW  09/11/2011   Procedure: PERICARDIAL WINDOW;  Surgeon: Gaye Pollack, MD;  Location[facility];  Service: Thoracic;  Laterality: N/A;  . REMOVAL OF PLEURAL DRAINAGE CATHETER  12/19/2011   Procedure: REMOVAL OF PLEURAL DRAINAGE CATHETER;  Surgeon: Gaye Pollack, MD;  Location: [facility];  Service: Thoracic;  Laterality: Right;  TO BE DONE IN MINOR ROOM, SHORT STAY  . [place]  . TOTAL HIP ARTHROPLASTY Left 06/07/2015   Procedure: LEFT TOTAL HIP ARTHROPLASTY ANTERIOR APPROACH;   Surgeon: Gaynelle Arabian, MD;  Location: [facility];  Service: Orthopedics;  Laterality: Left;  Marland Kitchen VIDEO BRONCHOSCOPY  09/11/2011   Procedure: VIDEO BRONCHOSCOPY;  Surgeon: Gaye Pollack, MD;  Location: [facility] OR;  Service: Thoracic;  Laterality: N/A;     ALLERGIES:  Allergies  Allergen Reactions  . Aspirin     Upset stomach  . Latex Other (See Comments)    Blistering and skin peels off  . Nsaids Nausea And Vomiting    Extreme nausea and vomiting     CURRENT MEDICATIONS:  Outpatient Encounter Medications as of 07/26/2022  Medication Sig Note  . gemfibrozil (LOPID) 600 MG tablet TAKE 1 TABLET (600 MG TOTAL) BY MOUTH 2 (TWO) TIMES DAILY BEFORE A MEAL.   Marland Kitchen lidocaine-prilocaine (EMLA) cream Apply to affected area once   . loperamide (IMODIUM) 2 MG capsule Take 2 mg by mouth 4 (four) times daily as needed for diarrhea or loose stools. Reported on 11/30/2015   . metFORMIN (GLUCOPHAGE-XR) 500 MG 24 hr tablet Take 1 tablet (500 mg total) by mouth daily with supper. Followed by [employer]   . omeprazole (PRILOSEC OTC) 20 MG tablet Take 20 mg by mouth daily as needed (acid reflux).   . prochlorperazine (COMPAZINE) 10 MG tablet Take 1 tablet (10 mg total) by mouth every 6 (six) hours as needed for nausea or vomiting.   . triamcinolone (KENALOG) 0.025 % ointment Apply 1 Application topically 2 (two) times  daily.   . amoxicillin (AMOXIL) 500 MG tablet Take 1 tablet (500 mg total) by mouth 2 (two) times daily. (Patient not taking: Reported on 07/26/2022)   . cholecalciferol (VITAMIN D3) 25 MCG (1000 UT) tablet Take 2 tablets (2,000 Units total) by mouth daily. (Patient not taking: Reported on 06/13/2022) 02/21/2022: Med ON HOLD   . ondansetron (ZOFRAN) 8 MG tablet Take 1 tablet (8 mg total) by mouth every 8 (eight) hours as needed for nausea or vomiting. Start on the third day after chemotherapy. (Patient not taking: Reported on 06/13/2022)    No facility-administered encounter medications on file as of  07/26/2022.     ONCOLOGIC FAMILY HISTORY:  Family History  Problem Relation Age of Onset  . Cancer Mother        breast  . Heart disease Father   . Diabetes Other   . Anesthesia problems Neg Hx      GENETIC COUNSELING/TESTING: ***  SOCIAL HISTORY:  Social History   Socioeconomic History  . Marital status: Single    Spouse name: Not on file  . Number of children: Not on file  . Years of education: Not on file  . Highest education level: Not on file  Occupational History  . Not on file  Tobacco Use  . Smoking status: Former    Packs/day: 1.50    Years: 30.00    Total pack years: 45.00    Types: Cigarettes    Quit date: 09/10/2007    Years since quitting: 14.8  . Smokeless tobacco: Never  Substance and Sexual Activity  . Alcohol use: No  . Drug use: No  . Sexual activity: Not Currently  Other Topics Concern  . Not on file  Social History Narrative  . Not on file   Social Determinants of Health   Financial Resource Strain: Not on file  Food Insecurity: Not on file  Transportation Needs: Not on file  Physical Activity: Not on file  Stress: Not on file  Social Connections: Not on file  Intimate Partner Violence: Not on file     OBSERVATIONS/OBJECTIVE:   LABORATORY DATA:  None for this visit.  DIAGNOSTIC IMAGING:  None for this visit.      ASSESSMENT AND PLAN:  Ms.. Parks is a pleasant 71 y.o. female with Stage *** right/left breast invasive ductal carcinoma, ER+/PR+/HER2-, diagnosed in ***, treated with lumpectomy, adjuvant radiation therapy, and anti-estrogen therapy with *** beginning in ***.  She presents to the Survivorship Clinic for our initial meeting and routine follow-up post-completion of treatment for breast cancer.    1. Stage *** right/left breast cancer:  Ms. Batchelder is continuing to recover from definitive treatment for breast cancer. She will follow-up with her medical oncologist, Dr. Ross Ludwig in *** with history and  physical exam per surveillance protocol.  She will continue her anti-estrogen therapy with ***. Thus far, she is tolerating the *** well, with minimal side effects. She was instructed to make Dr. Lindi Adie or myself aware if she begins to experience any worsening side effects of the medication and I could see her back in clinic to help manage those side effects, as needed. Her mammogram is due ***; orders placed today.  Her breast density is category ***. Today, a comprehensive survivorship care plan and treatment summary was reviewed with the patient today detailing her breast cancer diagnosis, treatment course, potential late/long-term effects of treatment, appropriate follow-up care with recommendations for the future, and patient education resources.  A copy of this summary,  along with a letter will be sent to the patient's primary care provider via mail/fax/In Basket message after today's visit.    #. Problem(s) at Visit______________  #. Bone health:  Given Ms. Loyal's age/history of breast cancer and her current treatment regimen including anti-estrogen therapy with ***, she is at risk for bone demineralization.  Her last DEXA scan was ***, which showed ***.  In the meantime, she was encouraged to increase her consumption of foods rich in calcium, as well as increase her weight-bearing activities.  She was given education on specific activities to promote bone health.  #. Cancer screening:  Due to Ms. Steffek's history and her age, she should receive screening for skin cancers, colon cancer, and gynecologic cancers.  The information and recommendations are listed on the patient's comprehensive care plan/treatment summary and were reviewed in detail with the patient.    #. Health maintenance and wellness promotion: Ms. Scioneaux was encouraged to consume 5-7 servings of fruits and vegetables per day. We reviewed the "Nutrition Rainbow" handout, as well as the handout "Take Control of Your Health and Reduce  Your Cancer Risk" from the [facility].  She was also encouraged to engage in moderate to vigorous exercise for 30 minutes per day most days of the week. We discussed the [employer] fitness program, which is designed for cancer survivors to help them become more physically fit after cancer treatments.  She was instructed to limit her alcohol consumption and continue to abstain from tobacco use/***was encouraged stop smoking.     #. Support services/counseling: It is not uncommon for this period of the patient's cancer care trajectory to be one of many emotions and stressors.  We discussed how this can be increasingly difficult during the times of quarantine and social distancing due to the COVID-19 pandemic.   She was given information regarding our available services and encouraged to contact me with any questions or for help enrolling in any of our support group/programs.    Follow up instructions:    -Return to cancer center ***  -Mammogram due in *** -Follow up with surgery *** -She is welcome to return back to the Survivorship Clinic at any time; no additional follow-up needed at this time.  -Consider referral back to survivorship as a long-term survivor for continued surveillance  The patient was provided an opportunity to ask questions and all were answered. The patient agreed with the plan and demonstrated an understanding of the instructions.   Total encounter time:*** minutes*in face-to-face visit time, chart review, lab review, care coordination, order entry, and documentation of the encounter time.    Wilber Bihari, NP 07/26/22 11:51 AM Medical Oncology and Hematology [facility] [place], [place] [address] Tel. [phone]    Fax. [phone]  *Total Encounter Time as defined by the Centers for Medicare and Medicaid Services includes, in addition to the face-to-face time of a patient visit (documented in the note above)  non-face-to-face time: obtaining and reviewing outside history, ordering and reviewing medications, tests or procedures, care coordination (communications with other health care professionals or caregivers) and documentation in the medical record.

## 2022-07-27 ENCOUNTER — Other Ambulatory Visit: Payer: Self-pay

## 2022-07-27 LAB — CANCER ANTIGEN 27.29: CA 27.29: 201.9 U/mL — ABNORMAL HIGH (ref 0.0–38.6)

## 2022-07-30 ENCOUNTER — Telehealth: Payer: Self-pay

## 2022-07-30 ENCOUNTER — Other Ambulatory Visit: Payer: Self-pay

## 2022-07-30 ENCOUNTER — Encounter: Payer: Self-pay | Admitting: Hematology and Oncology

## 2022-07-30 NOTE — Assessment & Plan Note (Signed)
Barbara Parks is a 72 year old woman with metastatic breast cancer recently started on Enhertu.  Today she is here for cycle 5.  We reviewed her restaging scans in detail.  I reviewed with her that with the Enhertu we have to be careful for pulmonary toxicity.  The findings in her lung will need to be monitored closely.  We do not have a baseline CT chest from prior to her starting Enhertu or any timeframe close to this.  Considering she is not symptomatically worse and the rest of her disease has improved we opted to proceed with treatment and will obtain CT chest high-resolution after cycle 6 of therapy.  I will place orders for the repeat CT scan as well as repeat echo.  We reviewed risk and benefits of Enhertu in detail today.  She knows to let us know if she develops any worsening shortness of breath cough or other concerns.  We will see [name] back in 3 weeks for labs, follow-up, and her next treatment.

## 2022-07-30 NOTE — Telephone Encounter (Signed)
Called and LVM with scheduled ECHO appt. Appt is 1/16 at 1300 at [facility]. Instructed Pt to call back with any questions.

## 2022-07-30 NOTE — Progress Notes (Signed)
[facility] Follow up:    Saintclair Halsted, FNP [place] [address]   DIAGNOSIS:  Cancer Staging  Carcinoma of right breast metastatic to lung [facility]) Staging form: Breast, AJCC 7th Edition - Clinical: Stage IV ([place], NX, M1) - Signed by Deatra Robinson, MD on 09/09/2013 Laterality: Right Biopsy of metastatic site performed: No - Pathologic: No stage assigned - Unsigned Laterality: Right  Malignant neoplasm of upper-outer quadrant of right breast in female, estrogen receptor positive ([place]) Staging form: Breast, AJCC 7th Edition - Clinical: Stage IV ([place], NX, M1) - Signed by Chauncey Cruel, MD on 08/13/2014   SUMMARY OF ONCOLOGIC HISTORY: Oncology History  Carcinoma of right breast metastatic to lung ([place])  10/10/2011 Initial Diagnosis   Carcinoma of right breast metastatic to lung ([place])   05/02/2022 -  Chemotherapy   Patient is on Treatment Plan : BREAST METASTATIC Fam-Trastuzumab Deruxtecan-nxki (Enhertu) (5.4) q21d     Malignant neoplasm of upper-outer quadrant of right breast in female, estrogen receptor positive ([place])  03/22/2004 Initial Diagnosis   right mastectomy and sentinel lymph node sampling 03/22/2004 for a right upper-outer quadrant pT2 pN1, stage IIB invasive ductal carcinoma with lobular features, grade 2, estrogen receptor and progesterone receptor positive, HER-2 negative, with an MIB-1 of 9%   05/2004 - 09/18/2004 Chemotherapy   adjuvant chemotherapy with dose dense doxorubicin and cyclophosphamide x4 cycles (first cycle delayed one week) followed by dose dense paclitaxel x4 was completed 09/18/2004   09/2004 - 2009 Anti-estrogen oral therapy   tamoxifen started March 2006, discontinued 2009   08/2011 Relapse/Recurrence   Metastatic disease:large pericardial effusion, large right pleural effusion and possible right middle lobe bronchial obstruction  status post pericardial window placement, fiberoptic bronchoscopy and  right Pleurx placement 09/11/2011, with biopsy of the bronchus intermedius and pericardium positive for metastatic breast cancer, ER 91%, PR 100% p with an MIB-1 of 35%, and no HER-2 amplification, ratio being 1.37   10/10/2011 - 07/2019 Anti-estrogen oral therapy   BOLERO-4 trial 10/10/2011, receiving letrozole and everolimus (stopped for progression)   08/16/2019 Miscellaneous   foundation 1 requested on liver biopsy from 08/16/2019 shows a stable microsatellite status with 1 mutation/MB, amplification of C11orf30, FGF10 and RICTOR, with mutations in ESR1 and PTEN   08/16/2019 Miscellaneous   Liver biopsy: Metastatic carcinoma to the liver consistent with breast primary, ER 100%, PR 100%, Ki-67 5%, HER2 2+ by IHC, FISH HER2 negative   08/24/2019 -  Anti-estrogen oral therapy   fulvestrant started 08/24/2019, repeated every 4 weeks Palbociclib 125 mg daily 21 days on 7 days off started 08/25/2019 Switched to ribociclib s starting 08/22/2020   02/28/2021 -  Radiation Therapy   palliative radiation (Dr. Lynnette Caffey in [place]) 02/28/2021 to right hip area   10/27/2021 -  Anti-estrogen oral therapy   Abemaciclib with fulvestrant   01/14/2022 Miscellaneous   Guardant360: ESR 1 mutation (no other mutations) MSI high was not detected, PTEN, T p53, EGFR amplification, RB1 mutations detected without any approved treatment options     CURRENT THERAPY: Enhertu  INTERVAL HISTORY: Barbara Parks 72 y.o. female returns for f/u of her metastatic breast cancer on treatment with Enhertu.  She is here today for her fifth treatment.  She says that she has some mild dyspnea, however this has been present and unchanged since prior to starting her treatment.  Otherwise she is feeling well.    Her most recent echocardiogram occurred on April 29, 2022 demonstrating  a left ventricular ejection fraction of 60 to 65%.  Since her last visit she underwent restaging CT scans on July 24, 2022.  Prior to this she had not  undergone any chest films since December 2021.  CT chest abdomen and pelvis showed worsening right-sided pleural effusion with complete collapse of the right lower lobe, some pleural enhancement and mild pleural thickening does not allow for exclusion of malignant effusion though this could be seen with chronic effusion.  No discrete suspicious nodule in the aerated portions of the lung.  Slight interval decrease in size of hepatic metastatic lesions, lesions not as well-seen on previous MRI imaging Eovist MRI was recommended.  Increasingly sclerotic appearance and more diffuse nature of bony metastatic disease compared to December 2021 however correlation with bone scan was recommended.  Other findings were incidental.   Patient Active Problem List   Diagnosis Date Noted   Port-A-Cath in place 06/13/2022   Liver metastases 08/09/2019   Malignant neoplasm metastatic to bone ([place]) 12/23/2018   Goals of care, counseling/discussion 12/23/2018   Hyperglycemia 10/02/2017   Hepatic steatosis 01/23/2017   OA (osteoarthritis) of hip 06/07/2015   Elevated cholesterol with high triglycerides 03/22/2015   Malignant neoplasm of upper-outer quadrant of right breast in female, estrogen receptor positive ([place]) 08/13/2014   Thoracic aorta atherosclerosis ([place]) 08/13/2014   Proteinuria 06/16/2014   Hypercalcemia 06/16/2014   Fatigue 04/21/2014   ADHD (attention deficit hyperactivity disorder)    Osteopenia due to cancer therapy 09/09/2013   Carcinoma of right breast metastatic to lung ([place]) 10/10/2011   Malignant pericardial effusion 09/10/2011   Pleural effusion 09/10/2011   Pneumonia 08/26/2011   Asthma in adult without complication 50/93/2671   Osteoarthritis of left hip 08/17/2010    is allergic to aspirin, latex, and nsaids.  MEDICAL HISTORY: Past Medical History:  Diagnosis Date   Arthritis    left hip   Asthma    breast ca 2005   breast/chemo R mastectomy   Breast cancer ([place])     Dermatitis    Diabetes mellitus without complication ([place])    GERD (gastroesophageal reflux disease)    Hypercholesteremia    Metastasis to lung ([place]) dx'd 08/2011   Osteopenia due to cancer therapy 09/09/2013   Palpitations    Personal history of chemotherapy    Shortness of breath     SURGICAL HISTORY: Past Surgical History:  Procedure Laterality Date   BREAST SURGERY  2005   right   CATARACT EXTRACTION W/PHACO Left 01/18/2014   Procedure: CATARACT EXTRACTION PHACO AND INTRAOCULAR LENS PLACEMENT ([place]);  Surgeon: Elta Guadeloupe T. Gershon Crane, MD;  Location: [facility];  Service: Ophthalmology;  Laterality: Left;  CDE 15.79   CATARACT EXTRACTION W/PHACO Right 02/08/2014   Procedure: CATARACT EXTRACTION PHACO AND INTRAOCULAR LENS PLACEMENT ([facility]);  Surgeon: Elta Guadeloupe T. Gershon Crane, MD;  Location: [facility];  Service: Ophthalmology;  Laterality: Right;  CDE 4.41   CHEST TUBE INSERTION  09/11/2011   Procedure: INSERTION PLEURAL DRAINAGE CATHETER;  Surgeon: Gaye Pollack, MD;  Location: [facility];  Service: Thoracic;  Laterality: Right;   IR IMAGING GUIDED PORT INSERTION  04/23/2022   MASTECTOMY Right    PERICARDIAL WINDOW  09/11/2011   Procedure: PERICARDIAL WINDOW;  Surgeon: Gaye Pollack, MD;  Location: [facility];  Service: Thoracic;  Laterality: N/A;   REMOVAL OF PLEURAL DRAINAGE CATHETER  12/19/2011   Procedure: REMOVAL OF PLEURAL DRAINAGE CATHETER;  Surgeon: Gaye Pollack, MD;  Location: [facility];  Service: Thoracic;  Laterality: Right;  TO BE DONE IN MINOR ROOM, SHORT STAY   SPINE SURGERY  1996   TOTAL HIP ARTHROPLASTY Left 06/07/2015   Procedure: LEFT TOTAL HIP ARTHROPLASTY ANTERIOR APPROACH;  Surgeon: Gaynelle Arabian, MD;  Location: [facility];  Service: Orthopedics;  Laterality: Left;   VIDEO BRONCHOSCOPY  09/11/2011   Procedure: VIDEO BRONCHOSCOPY;  Surgeon: Gaye Pollack, MD;  Location: [facility];  Service: Thoracic;  Laterality: N/A;    SOCIAL HISTORY: Social History   Socioeconomic History   Marital status: Single     Spouse name: Not on file   Number of children: Not on file   Years of education: Not on file   Highest education level: Not on file  Occupational History   Not on file  Tobacco Use   Smoking status: Former    Packs/day: 1.50    Years: 30.00    Total pack years: 45.00    Types: Cigarettes    Quit date: 09/10/2007    Years since quitting: 14.8   Smokeless tobacco: Never  Substance and Sexual Activity   Alcohol use: No   Drug use: No   Sexual activity: Not Currently  Other Topics Concern   Not on file  Social History Narrative   Not on file   Social Determinants of Health   Financial Resource Strain: Not on file  Food Insecurity: Not on file  Transportation Needs: Not on file  Physical Activity: Not on file  Stress: Not on file  Social Connections: Not on file  Intimate Partner Violence: Not on file    FAMILY HISTORY: Family History  Problem Relation Age of Onset   Cancer Mother        breast   Heart disease Father    Diabetes Other    Anesthesia problems Neg Hx     Review of Systems  Constitutional:  Positive for fatigue. Negative for appetite change, chills, fever and unexpected weight change.  HENT:   Negative for hearing loss, lump/mass and trouble swallowing.   Eyes:  Negative for eye problems and icterus.  Respiratory:  Negative for chest tightness, cough and shortness of breath.   Cardiovascular:  Negative for chest pain, leg swelling and palpitations.  Gastrointestinal:  Negative for abdominal distention, abdominal pain, constipation, diarrhea, nausea and vomiting.  Endocrine: Negative for hot flashes.  Genitourinary:  Negative for difficulty urinating.   Musculoskeletal:  Negative for arthralgias.  Skin:  Negative for itching and rash.  Neurological:  Negative for dizziness, extremity weakness, headaches and numbness.  Hematological:  Negative for adenopathy. Does not bruise/bleed easily.  Psychiatric/Behavioral:  Negative for depression. The patient is  not nervous/anxious.       PHYSICAL EXAMINATION  ECOG PERFORMANCE STATUS: 1 - Symptomatic but completely ambulatory  Vitals:   07/26/22 1111  BP: 114/75  Pulse: (!) 110  Resp: 18  Temp: (!) 97.5 F (36.4 C)  SpO2: 98%    Physical Exam Constitutional:      General: She is not in acute distress.    Appearance: Normal appearance. She is not toxic-appearing.  HENT:     Head: Normocephalic and atraumatic.  Eyes:     General: No scleral icterus. Cardiovascular:     Rate and Rhythm: Normal rate and regular rhythm.     Pulses: Normal pulses.     Heart sounds: Normal heart sounds.  Pulmonary:     Effort: Pulmonary effort is normal.     Breath sounds: Normal breath sounds.  Abdominal:     General:  Abdomen is flat. Bowel sounds are normal. There is no distension.     Palpations: Abdomen is soft.     Tenderness: There is no abdominal tenderness.  Musculoskeletal:        General: No swelling.     Cervical back: Neck supple.  Lymphadenopathy:     Cervical: No cervical adenopathy.  Skin:    General: Skin is warm and dry.     Findings: No rash.  Neurological:     General: No focal deficit present.     Mental Status: She is alert.  Psychiatric:        Mood and Affect: Mood normal.        Behavior: Behavior normal.     LABORATORY DATA:  CBC    Component Value Date/Time   WBC 8.4 07/26/2022 1051   WBC 10.0 02/26/2022 1123   RBC 3.72 (L) 07/26/2022 1051   HGB 12.5 07/26/2022 1051   HGB 14.1 07/08/2017 0942   HCT 37.6 07/26/2022 1051   HCT 42.9 07/08/2017 0942   PLT 414 (H) 07/26/2022 1051   PLT 283 07/08/2017 0942   MCV 101.1 (H) 07/26/2022 1051   MCV 81.1 07/08/2017 0942   MCH 33.6 07/26/2022 1051   MCHC 33.2 07/26/2022 1051   RDW 15.9 (H) 07/26/2022 1051   RDW 14.6 (H) 07/08/2017 0942   LYMPHSABS 0.7 07/26/2022 1051   LYMPHSABS 1.2 07/08/2017 0942   MONOABS 1.1 (H) 07/26/2022 1051   MONOABS 0.7 07/08/2017 0942   EOSABS 0.2 07/26/2022 1051   EOSABS 0.1  07/08/2017 0942   BASOSABS 0.1 07/26/2022 1051   BASOSABS 0.0 07/08/2017 0942    CMP     Component Value Date/Time   NA 137 07/26/2022 1051   NA 139 07/08/2017 0942   K 4.1 07/26/2022 1051   K 3.8 07/08/2017 0942   CL 106 07/26/2022 1051   CL 109 (H) 12/31/2012 0940   CO2 23 07/26/2022 1051   CO2 18 (L) 07/08/2017 0942   GLUCOSE 161 (H) 07/26/2022 1051   GLUCOSE 156 (H) 07/08/2017 0942   GLUCOSE 127 (H) 12/31/2012 0940   BUN 16 07/26/2022 1051   BUN 17.7 07/08/2017 0942   CREATININE 0.90 07/26/2022 1051   CREATININE 1.0 07/08/2017 0942   CALCIUM 9.6 07/26/2022 1051   CALCIUM 10.4 07/08/2017 0942   PROT 7.4 07/26/2022 1051   PROT 7.7 07/08/2017 0942   ALBUMIN 3.9 07/26/2022 1051   ALBUMIN 3.4 (L) 07/08/2017 0942   AST 13 (L) 07/26/2022 1051   AST 54 (H) 07/08/2017 0942   ALT 8 07/26/2022 1051   ALT 39 07/08/2017 0942   ALKPHOS 101 07/26/2022 1051   ALKPHOS 232 (H) 07/08/2017 0942   BILITOT 0.3 07/26/2022 1051   BILITOT 0.42 07/08/2017 0942   GFRNONAA >60 07/26/2022 1051   GFRAA 49 (L) 04/05/2020 1504   GFRAA 54 (L) 12/25/2017 0959      ASSESSMENT and THERAPY PLAN:   Malignant neoplasm of upper-outer quadrant of right breast in female, estrogen receptor positive ([facility]) Barbara Parks is a 72 year old woman with metastatic breast cancer recently started on Enhertu.  Today she is here for cycle 5.  We reviewed her restaging scans in detail.  I reviewed with her that with the Enhertu we have to be careful for pulmonary toxicity.  The findings in her lung will need to be monitored closely.  We do not have a baseline CT chest from prior to her starting Enhertu or any timeframe close to this.  Considering  she is not symptomatically worse and the rest of her disease has improved we opted to proceed with treatment and will obtain CT chest high-resolution after cycle 6 of therapy.  I will place orders for the repeat CT scan as well as repeat echo.  We reviewed risk and benefits of  Enhertu in detail today.  She knows to let us know if she develops any worsening shortness of breath cough or other concerns.  We will see Barbara Parks back in 3 weeks for labs, follow-up, and her next treatment.     All questions were answered. The patient knows to call the clinic with any problems, questions or concerns. We can certainly see the patient much sooner if necessary.  Total encounter time:40 minutes*in face-to-face visit time, chart review, lab review, care coordination, order entry, and documentation of the encounter time.    Wilber Bihari, NP 07/30/22 9:06 AM Medical Oncology and Hematology [facility] [place], [place] [address] Tel. [phone]    Fax. [phone]  *Total Encounter Time as defined by the Centers for Medicare and Medicaid Services includes, in addition to the face-to-face time of a patient visit (documented in the note above) non-face-to-face time: obtaining and reviewing outside history, ordering and reviewing medications, tests or procedures, care coordination (communications with other health care professionals or caregivers) and documentation in the medical record.

## 2022-07-31 ENCOUNTER — Telehealth: Payer: Self-pay | Admitting: Hematology and Oncology

## 2022-07-31 ENCOUNTER — Other Ambulatory Visit: Payer: Self-pay

## 2022-07-31 NOTE — Telephone Encounter (Signed)
Scheduled appointment per WQ. Left voicemail. 

## 2022-08-01 ENCOUNTER — Other Ambulatory Visit: Payer: Self-pay

## 2022-08-05 ENCOUNTER — Other Ambulatory Visit: Payer: Self-pay

## 2022-08-13 ENCOUNTER — Ambulatory Visit (HOSPITAL_COMMUNITY)
Admission: RE | Admit: 2022-08-13 | Discharge: 2022-08-13 | Disposition: A | Discharge status: Home / Self Care | Payer: Medicare Other | Source: Ambulatory Visit | Attending: Adult Health | Admitting: Adult Health

## 2022-08-13 DIAGNOSIS — Z170 Estrogen receptor positive status [ER+]: Secondary | ICD-10-CM | POA: Insufficient documentation

## 2022-08-13 DIAGNOSIS — E785 Hyperlipidemia, unspecified: Secondary | ICD-10-CM | POA: Insufficient documentation

## 2022-08-13 DIAGNOSIS — Z0189 Encounter for other specified special examinations: Secondary | ICD-10-CM

## 2022-08-13 DIAGNOSIS — C50411 Malignant neoplasm of upper-outer quadrant of right female breast: Secondary | ICD-10-CM | POA: Diagnosis not present

## 2022-08-13 DIAGNOSIS — C7800 Secondary malignant neoplasm of unspecified lung: Secondary | ICD-10-CM | POA: Diagnosis not present

## 2022-08-13 DIAGNOSIS — C50911 Malignant neoplasm of unspecified site of right female breast: Secondary | ICD-10-CM

## 2022-08-13 DIAGNOSIS — R0602 Shortness of breath: Secondary | ICD-10-CM | POA: Insufficient documentation

## 2022-08-13 DIAGNOSIS — Z08 Encounter for follow-up examination after completed treatment for malignant neoplasm: Secondary | ICD-10-CM

## 2022-08-13 DIAGNOSIS — E119 Type 2 diabetes mellitus without complications: Secondary | ICD-10-CM | POA: Diagnosis not present

## 2022-08-13 LAB — ECHOCARDIOGRAM COMPLETE
Area-P 1/2: 3.74 cm2
S' Lateral: 3 cm

## 2022-08-13 NOTE — Progress Notes (Signed)
  Echocardiogram 2D Echocardiogram has been performed.  Darlina Sicilian M 08/13/2022, 1:44 PM

## 2022-08-16 ENCOUNTER — Inpatient Hospital Stay: Payer: Medicare Other | Attending: Oncology

## 2022-08-16 ENCOUNTER — Inpatient Hospital Stay: Payer: Medicare Other

## 2022-08-16 ENCOUNTER — Inpatient Hospital Stay (HOSPITAL_BASED_OUTPATIENT_CLINIC_OR_DEPARTMENT_OTHER): Payer: Medicare Other | Admitting: Adult Health

## 2022-08-16 ENCOUNTER — Encounter: Payer: Self-pay | Admitting: Adult Health

## 2022-08-16 VITALS — BP 108/65 | HR 109 | Temp 97.7°F | Resp 16 | Ht 64.0 in | Wt 141.3 lb

## 2022-08-16 VITALS — HR 90

## 2022-08-16 DIAGNOSIS — C50411 Malignant neoplasm of upper-outer quadrant of right female breast: Secondary | ICD-10-CM | POA: Insufficient documentation

## 2022-08-16 DIAGNOSIS — Z79899 Other long term (current) drug therapy: Secondary | ICD-10-CM | POA: Insufficient documentation

## 2022-08-16 DIAGNOSIS — C787 Secondary malignant neoplasm of liver and intrahepatic bile duct: Secondary | ICD-10-CM

## 2022-08-16 DIAGNOSIS — C7951 Secondary malignant neoplasm of bone: Secondary | ICD-10-CM

## 2022-08-16 DIAGNOSIS — C7800 Secondary malignant neoplasm of unspecified lung: Secondary | ICD-10-CM | POA: Insufficient documentation

## 2022-08-16 DIAGNOSIS — C50911 Malignant neoplasm of unspecified site of right female breast: Secondary | ICD-10-CM

## 2022-08-16 DIAGNOSIS — Z5112 Encounter for antineoplastic immunotherapy: Secondary | ICD-10-CM | POA: Diagnosis not present

## 2022-08-16 DIAGNOSIS — Z95828 Presence of other vascular implants and grafts: Secondary | ICD-10-CM

## 2022-08-16 DIAGNOSIS — Z5111 Encounter for antineoplastic chemotherapy: Secondary | ICD-10-CM | POA: Diagnosis present

## 2022-08-16 LAB — CBC WITH DIFFERENTIAL (CANCER CENTER ONLY)
Abs Immature Granulocytes: 0.11 10*3/uL — ABNORMAL HIGH (ref 0.00–0.07)
Basophils Absolute: 0.1 10*3/uL (ref 0.0–0.1)
Basophils Relative: 1 %
Eosinophils Absolute: 0.1 10*3/uL (ref 0.0–0.5)
Eosinophils Relative: 2 %
HCT: 37.9 % (ref 36.0–46.0)
Hemoglobin: 12.8 g/dL (ref 12.0–15.0)
Immature Granulocytes: 1 %
Lymphocytes Relative: 8 %
Lymphs Abs: 0.7 10*3/uL (ref 0.7–4.0)
MCH: 33.9 pg (ref 26.0–34.0)
MCHC: 33.8 g/dL (ref 30.0–36.0)
MCV: 100.3 fL — ABNORMAL HIGH (ref 80.0–100.0)
Monocytes Absolute: 1.3 10*3/uL — ABNORMAL HIGH (ref 0.1–1.0)
Monocytes Relative: 15 %
Neutro Abs: 6.2 10*3/uL (ref 1.7–7.7)
Neutrophils Relative %: 73 %
Platelet Count: 449 10*3/uL — ABNORMAL HIGH (ref 150–400)
RBC: 3.78 MIL/uL — ABNORMAL LOW (ref 3.87–5.11)
RDW: 15.6 % — ABNORMAL HIGH (ref 11.5–15.5)
WBC Count: 8.5 10*3/uL (ref 4.0–10.5)
nRBC: 0 % (ref 0.0–0.2)

## 2022-08-16 LAB — CMP (CANCER CENTER ONLY)
ALT: 8 U/L (ref 0–44)
AST: 14 U/L — ABNORMAL LOW (ref 15–41)
Albumin: 4 g/dL (ref 3.5–5.0)
Alkaline Phosphatase: 100 U/L (ref 38–126)
Anion gap: 8 (ref 5–15)
BUN: 18 mg/dL (ref 8–23)
CO2: 20 mmol/L — ABNORMAL LOW (ref 22–32)
Calcium: 9.5 mg/dL (ref 8.9–10.3)
Chloride: 109 mmol/L (ref 98–111)
Creatinine: 0.92 mg/dL (ref 0.44–1.00)
GFR, Estimated: >60 mL/min (ref >60)
Glucose, Bld: 89 mg/dL (ref 70–99)
Potassium: 4.3 mmol/L (ref 3.5–5.1)
Sodium: 137 mmol/L (ref 135–145)
Total Bilirubin: 0.3 mg/dL (ref 0.3–1.2)
Total Protein: 7.2 g/dL (ref 6.5–8.1)

## 2022-08-16 MED ORDER — SODIUM CHLORIDE 0.9 % IV SOLN
10.0000 mg | Freq: Once | INTRAVENOUS | Status: AC
  Administered 2022-08-16: 10 mg via INTRAVENOUS
  Filled 2022-08-16: qty 10

## 2022-08-16 MED ORDER — HEPARIN SOD (PORK) LOCK FLUSH 100 UNIT/ML IV SOLN
500.0000 [IU] | Freq: Once | INTRAVENOUS | Status: AC | PRN
  Administered 2022-08-16: 500 [IU]

## 2022-08-16 MED ORDER — SODIUM CHLORIDE 0.9% FLUSH
10.0000 mL | Freq: Once | INTRAVENOUS | Status: AC
  Administered 2022-08-16: 10 mL

## 2022-08-16 MED ORDER — DIPHENHYDRAMINE HCL 25 MG PO CAPS
50.0000 mg | ORAL_CAPSULE | Freq: Once | ORAL | Status: AC
  Administered 2022-08-16: 50 mg via ORAL
  Filled 2022-08-16: qty 2

## 2022-08-16 MED ORDER — FAM-TRASTUZUMAB DERUXTECAN-NXKI CHEMO 100 MG IV SOLR
4.4000 mg/kg | Freq: Once | INTRAVENOUS | Status: AC
  Administered 2022-08-16: 300 mg via INTRAVENOUS
  Filled 2022-08-16: qty 15

## 2022-08-16 MED ORDER — ACETAMINOPHEN 325 MG PO TABS
650.0000 mg | ORAL_TABLET | Freq: Once | ORAL | Status: AC
  Administered 2022-08-16: 650 mg via ORAL
  Filled 2022-08-16: qty 2

## 2022-08-16 MED ORDER — PALONOSETRON HCL INJECTION 0.25 MG/5ML
0.2500 mg | Freq: Once | INTRAVENOUS | Status: AC
  Administered 2022-08-16: 0.25 mg via INTRAVENOUS
  Filled 2022-08-16: qty 5

## 2022-08-16 MED ORDER — SODIUM CHLORIDE 0.9 % IV SOLN
150.0000 mg | Freq: Once | INTRAVENOUS | Status: AC
  Administered 2022-08-16: 150 mg via INTRAVENOUS
  Filled 2022-08-16: qty 150

## 2022-08-16 MED ORDER — DEXTROSE 5 % IV SOLN: Freq: Once | INTRAVENOUS | Status: AC

## 2022-08-16 MED ORDER — SODIUM CHLORIDE 0.9% FLUSH
10.0000 mL | INTRAVENOUS | Status: DC | PRN
  Administered 2022-08-16: 10 mL

## 2022-08-16 NOTE — Assessment & Plan Note (Signed)
Barbara Parks is here for follow-up and evaluation of her metastatic breast cancer currently on treatment with Enhertu.  She is tolerating this well.  I reviewed her labs with her which are stable and she will proceed with her treatment today.  She is slightly tachycardic but stable for her.  She will undergo CT high-resolution chest in a couple weeks to reevaluate her lung status.  We will see her back in 3 weeks for labs, follow-up, and her next infusion.

## 2022-08-16 NOTE — Progress Notes (Signed)
Called Ama at [facility] requesting latest lipid panel and A1C results. Fax number given.

## 2022-08-16 NOTE — Progress Notes (Signed)
[facility] Follow up:    Barbara Halsted, FNP [place] [address]   DIAGNOSIS:  Cancer Staging  Carcinoma of right breast metastatic to lung [facility]) Staging form: Breast, AJCC 7th Edition - Clinical: Stage IV ([place], NX, M1) - Signed by Deatra Robinson, MD on 09/09/2013 Laterality: Right Biopsy of metastatic site performed: No - Pathologic: No stage assigned - Unsigned Laterality: Right  Malignant neoplasm of upper-outer quadrant of right breast in female, estrogen receptor positive ([place]) Staging form: Breast, AJCC 7th Edition - Clinical: Stage IV ([place], NX, M1) - Signed by Chauncey Cruel, MD on 08/13/2014   SUMMARY OF ONCOLOGIC HISTORY: Oncology History  Carcinoma of right breast metastatic to lung ([place])  10/10/2011 Initial Diagnosis   Carcinoma of right breast metastatic to lung ([place])   05/02/2022 -  Chemotherapy   Patient is on Treatment Plan : BREAST METASTATIC Fam-Trastuzumab Deruxtecan-nxki (Enhertu) (5.4) q21d     Malignant neoplasm of upper-outer quadrant of right breast in female, estrogen receptor positive ([place])  03/22/2004 Initial Diagnosis   right mastectomy and sentinel lymph node sampling 03/22/2004 for a right upper-outer quadrant pT2 pN1, stage IIB invasive ductal carcinoma with lobular features, grade 2, estrogen receptor and progesterone receptor positive, HER-2 negative, with an MIB-1 of 9%   05/2004 - 09/18/2004 Chemotherapy   adjuvant chemotherapy with dose dense doxorubicin and cyclophosphamide x4 cycles (first cycle delayed one week) followed by dose dense paclitaxel x4 was completed 09/18/2004   09/2004 - 2009 Anti-estrogen oral therapy   tamoxifen started March 2006, discontinued 2009   08/2011 Relapse/Recurrence   Metastatic disease:large pericardial effusion, large right pleural effusion and possible right middle lobe bronchial obstruction  status post pericardial window placement, fiberoptic bronchoscopy and  right Pleurx placement 09/11/2011, with biopsy of the bronchus intermedius and pericardium positive for metastatic breast cancer, ER 91%, PR 100% p with an MIB-1 of 35%, and no HER-2 amplification, ratio being 1.37   10/10/2011 - 07/2019 Anti-estrogen oral therapy   BOLERO-4 trial 10/10/2011, receiving letrozole and everolimus (stopped for progression)   08/16/2019 Miscellaneous   foundation 1 requested on liver biopsy from 08/16/2019 shows a stable microsatellite status with 1 mutation/MB, amplification of C11orf30, FGF10 and RICTOR, with mutations in ESR1 and PTEN   08/16/2019 Miscellaneous   Liver biopsy: Metastatic carcinoma to the liver consistent with breast primary, ER 100%, PR 100%, Ki-67 5%, HER2 2+ by IHC, FISH HER2 negative   08/24/2019 -  Anti-estrogen oral therapy   fulvestrant started 08/24/2019, repeated every 4 weeks Palbociclib 125 mg daily 21 days on 7 days off started 08/25/2019 Switched to ribociclib s starting 08/22/2020   02/28/2021 -  Radiation Therapy   palliative radiation (Dr. Lynnette Caffey in [place]) 02/28/2021 to right hip area   10/27/2021 -  Anti-estrogen oral therapy   Abemaciclib with fulvestrant   01/14/2022 Miscellaneous   Guardant360: ESR 1 mutation (no other mutations) MSI high was not detected, PTEN, T p53, EGFR amplification, RB1 mutations detected without any approved treatment options     CURRENT THERAPY: Enhertu  INTERVAL HISTORY: Barbara Parks 72 y.o. female returns for follow-up of her metastatic breast cancer.  She continues on Enhertu given every 3 weeks.  Her most recent echo occurred on August 13, 2022 demonstrating a left ventricular ejection fraction of 60 to 65%.  Barbara Parks is doing well on her treatment.  She has no significant concerns today.  She continues to have occasional shortness of breath on  exertion this is at baseline.   Patient Active Problem List   Diagnosis Date Noted   Port-A-Cath in place 06/13/2022   Malignant neoplasm metastatic  to liver ([place]) 08/09/2019   Malignant neoplasm metastatic to bone ([place]) 12/23/2018   Goals of care, counseling/discussion 12/23/2018   Hyperglycemia 10/02/2017   Hepatic steatosis 01/23/2017   OA (osteoarthritis) of hip 06/07/2015   Elevated cholesterol with high triglycerides 03/22/2015   Malignant neoplasm of upper-outer quadrant of right breast in female, estrogen receptor positive ([place]) 08/13/2014   Thoracic aorta atherosclerosis ([place]) 08/13/2014   Proteinuria 06/16/2014   Hypercalcemia 06/16/2014   Fatigue 04/21/2014   ADHD (attention deficit hyperactivity disorder)    Osteopenia due to cancer therapy 09/09/2013   Carcinoma of right breast metastatic to lung ([place]) 10/10/2011   Malignant pericardial effusion 09/10/2011   Pleural effusion 09/10/2011   Pneumonia 08/26/2011   Asthma in adult without complication 73/41/9379   Osteoarthritis of left hip 08/17/2010    is allergic to aspirin, latex, and nsaids.  MEDICAL HISTORY: Past Medical History:  Diagnosis Date   Arthritis    left hip   Asthma    breast ca 2005   breast/chemo R mastectomy   Breast cancer ([place])    Dermatitis    Diabetes mellitus without complication ([place])    GERD (gastroesophageal reflux disease)    Hypercholesteremia    Metastasis to lung ([place]) dx'd 08/2011   Osteopenia due to cancer therapy 09/09/2013   Palpitations    Personal history of chemotherapy    Shortness of breath     SURGICAL HISTORY: Past Surgical History:  Procedure Laterality Date   BREAST SURGERY  2005   right   CATARACT EXTRACTION W/PHACO Left 01/18/2014   Procedure: CATARACT EXTRACTION PHACO AND INTRAOCULAR LENS PLACEMENT ([place]);  Surgeon: Elta Guadeloupe T. Gershon Crane, MD;  Location: [facility];  Service: Ophthalmology;  Laterality: Left;  CDE 15.79   CATARACT EXTRACTION W/PHACO Right 02/08/2014   Procedure: CATARACT EXTRACTION PHACO AND INTRAOCULAR LENS PLACEMENT (IOC);  Surgeon: Elta Guadeloupe T. Gershon Crane, MD;  Location: [facility];  Service: Ophthalmology;   Laterality: Right;  CDE 4.41   CHEST TUBE INSERTION  09/11/2011   Procedure: INSERTION PLEURAL DRAINAGE CATHETER;  Surgeon: Gaye Pollack, MD;  Location: [facility];  Service: Thoracic;  Laterality: Right;   IR IMAGING GUIDED PORT INSERTION  04/23/2022   MASTECTOMY Right    PERICARDIAL WINDOW  09/11/2011   Procedure: PERICARDIAL WINDOW;  Surgeon: Gaye Pollack, MD;  Location: [facility];  Service: Thoracic;  Laterality: N/A;   REMOVAL OF PLEURAL DRAINAGE CATHETER  12/19/2011   Procedure: REMOVAL OF PLEURAL DRAINAGE CATHETER;  Surgeon: Gaye Pollack, MD;  Location: [facility];  Service: Thoracic;  Laterality: Right;  TO BE DONE IN MINOR ROOM, [place] HIP ARTHROPLASTY Left 06/07/2015   Procedure: LEFT TOTAL HIP ARTHROPLASTY ANTERIOR APPROACH;  Surgeon: Gaynelle Arabian, MD;  Location: [facility];  Service: Orthopedics;  Laterality: Left;   VIDEO BRONCHOSCOPY  09/11/2011   Procedure: VIDEO BRONCHOSCOPY;  Surgeon: Gaye Pollack, MD;  Location: [facility];  Service: Thoracic;  Laterality: N/A;    SOCIAL HISTORY: Social History   Socioeconomic History   Marital status: Single    Spouse name: Not on file   Number of children: Not on file   Years of education: Not on file   Highest education level: Not on file  Occupational History   Not on file  Tobacco Use  Smoking status: Former    Packs/day: 1.50    Years: 30.00    Total pack years: 45.00    Types: Cigarettes    Quit date: 09/10/2007    Years since quitting: 14.9   Smokeless tobacco: Never  Substance and Sexual Activity   Alcohol use: No   Drug use: No   Sexual activity: Not Currently  Other Topics Concern   Not on file  Social History Narrative   Not on file   Social Determinants of Health   Financial Resource Strain: Not on file  Food Insecurity: Not on file  Transportation Needs: Not on file  Physical Activity: Not on file  Stress: Not on file  Social Connections: Not on file  Intimate Partner Violence: Not on  file    FAMILY HISTORY: Family History  Problem Relation Age of Onset   Cancer Mother        breast   Heart disease Father    Diabetes Other    Anesthesia problems Neg Hx     Review of Systems  Constitutional:  Positive for fatigue (Mild). Negative for appetite change, chills, fever and unexpected weight change.  HENT:   Negative for hearing loss, lump/mass and trouble swallowing.   Eyes:  Negative for eye problems and icterus.  Respiratory:  Positive for shortness of breath (At baseline). Negative for chest tightness and cough.   Cardiovascular:  Negative for chest pain, leg swelling and palpitations.  Gastrointestinal:  Negative for abdominal distention, abdominal pain, constipation, diarrhea, nausea and vomiting.  Endocrine: Negative for hot flashes.  Genitourinary:  Negative for difficulty urinating.   Musculoskeletal:  Negative for arthralgias.  Skin:  Negative for itching and rash.  Neurological:  Negative for dizziness, extremity weakness, headaches and numbness.  Hematological:  Negative for adenopathy. Does not bruise/bleed easily.  Psychiatric/Behavioral:  Negative for depression. The patient is not nervous/anxious.       PHYSICAL EXAMINATION  ECOG PERFORMANCE STATUS: 1 - Symptomatic but completely ambulatory  Vitals:   08/16/22 1135  BP: 108/65  Pulse: (!) 109  Resp: 16  Temp: 97.7 F (36.5 C)  SpO2: 97%    Physical Exam Constitutional:      General: She is not in acute distress.    Appearance: Normal appearance. She is not toxic-appearing.  HENT:     Head: Normocephalic and atraumatic.  Eyes:     General: No scleral icterus. Cardiovascular:     Rate and Rhythm: Regular rhythm. Tachycardia present.     Pulses: Normal pulses.     Heart sounds: Normal heart sounds.     Comments: Slight tachycardia Pulmonary:     Effort: Pulmonary effort is normal.     Breath sounds: Normal breath sounds.  Abdominal:     General: Abdomen is flat. Bowel sounds are  normal. There is no distension.     Palpations: Abdomen is soft.     Tenderness: There is no abdominal tenderness.  Musculoskeletal:        General: No swelling.     Cervical back: Neck supple.  Lymphadenopathy:     Cervical: No cervical adenopathy.  Skin:    General: Skin is warm and dry.     Findings: No rash.  Neurological:     General: No focal deficit present.     Mental Status: She is alert.  Psychiatric:        Mood and Affect: Mood normal.        Behavior: Behavior normal.  LABORATORY DATA:  CBC    Component Value Date/Time   WBC 8.5 08/16/2022 1111   WBC 10.0 02/26/2022 1123   RBC 3.78 (L) 08/16/2022 1111   HGB 12.8 08/16/2022 1111   HGB 14.1 07/08/2017 0942   HCT 37.9 08/16/2022 1111   HCT 42.9 07/08/2017 0942   PLT 449 (H) 08/16/2022 1111   PLT 283 07/08/2017 0942   MCV 100.3 (H) 08/16/2022 1111   MCV 81.1 07/08/2017 0942   MCH 33.9 08/16/2022 1111   MCHC 33.8 08/16/2022 1111   RDW 15.6 (H) 08/16/2022 1111   RDW 14.6 (H) 07/08/2017 0942   LYMPHSABS 0.7 08/16/2022 1111   LYMPHSABS 1.2 07/08/2017 0942   MONOABS 1.3 (H) 08/16/2022 1111   MONOABS 0.7 07/08/2017 0942   EOSABS 0.1 08/16/2022 1111   EOSABS 0.1 07/08/2017 0942   BASOSABS 0.1 08/16/2022 1111   BASOSABS 0.0 07/08/2017 0942    CMP     Component Value Date/Time   NA 137 08/16/2022 1111   NA 139 07/08/2017 0942   K 4.3 08/16/2022 1111   K 3.8 07/08/2017 0942   CL 109 08/16/2022 1111   CL 109 (H) 12/31/2012 0940   CO2 20 (L) 08/16/2022 1111   CO2 18 (L) 07/08/2017 0942   GLUCOSE 89 08/16/2022 1111   GLUCOSE 156 (H) 07/08/2017 0942   GLUCOSE 127 (H) 12/31/2012 0940   BUN 18 08/16/2022 1111   BUN 17.7 07/08/2017 0942   CREATININE 0.92 08/16/2022 1111   CREATININE 1.0 07/08/2017 0942   CALCIUM 9.5 08/16/2022 1111   CALCIUM 10.4 07/08/2017 0942   PROT 7.2 08/16/2022 1111   PROT 7.7 07/08/2017 0942   ALBUMIN 4.0 08/16/2022 1111   ALBUMIN 3.4 (L) 07/08/2017 0942   AST 14 (L)  08/16/2022 1111   AST 54 (H) 07/08/2017 0942   ALT 8 08/16/2022 1111   ALT 39 07/08/2017 0942   ALKPHOS 100 08/16/2022 1111   ALKPHOS 232 (H) 07/08/2017 0942   BILITOT 0.3 08/16/2022 1111   BILITOT 0.42 07/08/2017 0942   GFRNONAA >60 08/16/2022 1111   GFRAA 49 (L) 04/05/2020 1504   GFRAA 54 (L) 12/25/2017 0959         ASSESSMENT and THERAPY PLAN:   Carcinoma of right breast metastatic to lung Barbara Parks is here for follow-up and evaluation of her metastatic breast cancer currently on treatment with Enhertu.  She is tolerating this well.  I reviewed her labs with her which are stable and she will proceed with her treatment today.  She is slightly tachycardic but stable for her.  She will undergo CT high-resolution chest in a couple weeks to reevaluate her lung status.  We will see her back in 3 weeks for labs, follow-up, and her next infusion.  All questions were answered. The patient knows to call the clinic with any problems, questions or concerns. We can certainly see the patient much sooner if necessary.  Total encounter time:20 minutes*in face-to-face visit time, chart review, lab review, care coordination, order entry, and documentation of the encounter time.    Wilber Bihari, NP 08/16/22 12:14 PM Medical Oncology and Hematology [facility] [address] [place], [place] [address] Tel. [phone]    Fax. [phone]  *Total Encounter Time as defined by the Centers for Medicare and Medicaid Services includes, in addition to the face-to-face time of a patient visit (documented in the note above) non-face-to-face time: obtaining and reviewing outside history, ordering and reviewing medications, tests or procedures, care coordination (communications  with other health care professionals or caregivers) and documentation in the medical record.

## 2022-08-17 LAB — CANCER ANTIGEN 27.29: CA 27.29: 182.9 U/mL — ABNORMAL HIGH (ref 0.0–38.6)

## 2022-08-20 ENCOUNTER — Telehealth: Payer: Self-pay | Admitting: *Deleted

## 2022-08-20 NOTE — Patient Outreach (Signed)
  Care Coordination   08/20/2022 Name: Cathleen Yagi MRN: [id number] DOB: 1951-05-12   Care Coordination Outreach Attempts:  A third unsuccessful outreach was attempted today to offer the patient with information about available care coordination services as a benefit of their health plan.   Follow Up Plan:  No further outreach attempts will be made at this time. We have been unable to contact the patient to offer or enroll patient in care coordination services  Encounter Outcome:  No Answer   Care Coordination Interventions:  No, not indicated    Raina Mina, RN Care Management Coordinator [place] Office [phone]

## 2022-08-21 IMAGING — DX DG HIP (WITH OR WITHOUT PELVIS) 2V BILAT
5 series · 5 of 5 positions shown · non-contrast
Comparison: Pelvis and left hip dated 05/17/2019. Nuclear medicine
bone scan dated 04/26/2020.

CLINICAL DATA: Progressive right hip and back pain. Metastatic
breast cancer, including bone metastases.

EXAM:
DG HIP (WITH OR WITHOUT PELVIS) 2V BILAT

[pelvis ap]
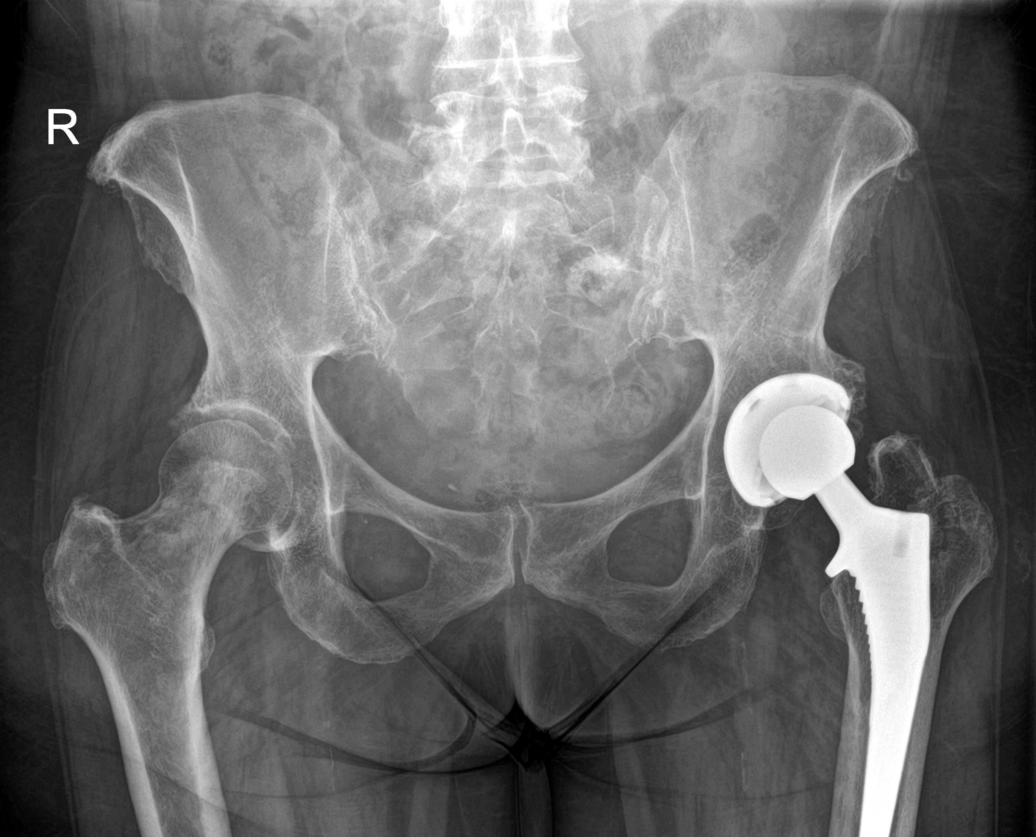

[hip ap (1 of 2)]
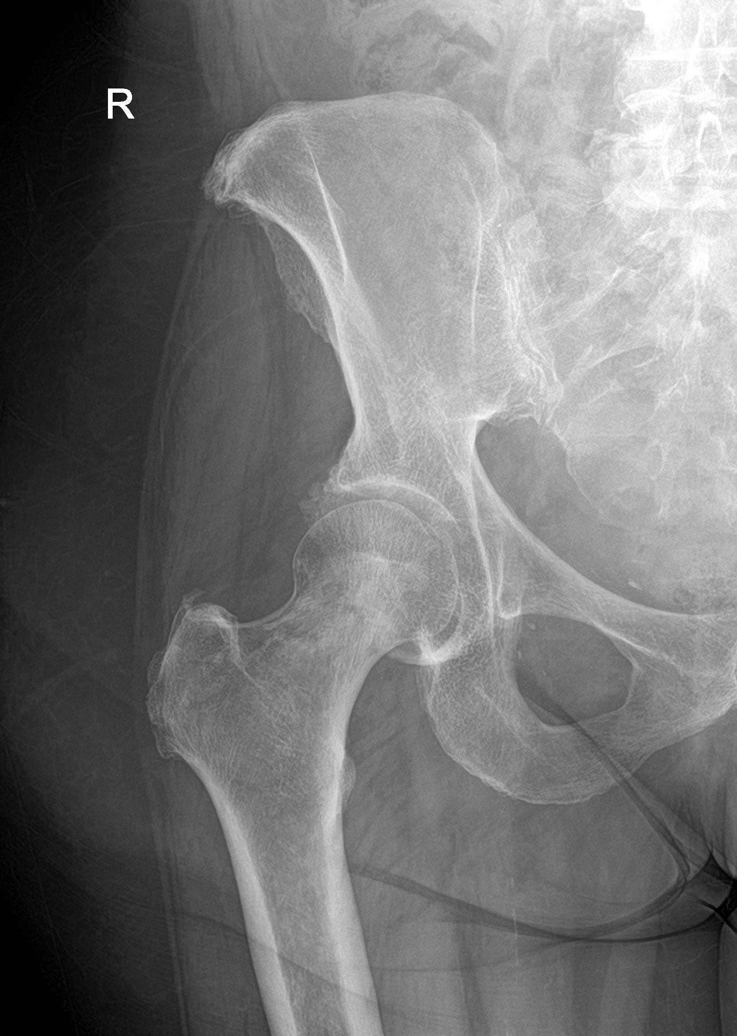

[hip lat (1 of 2)]
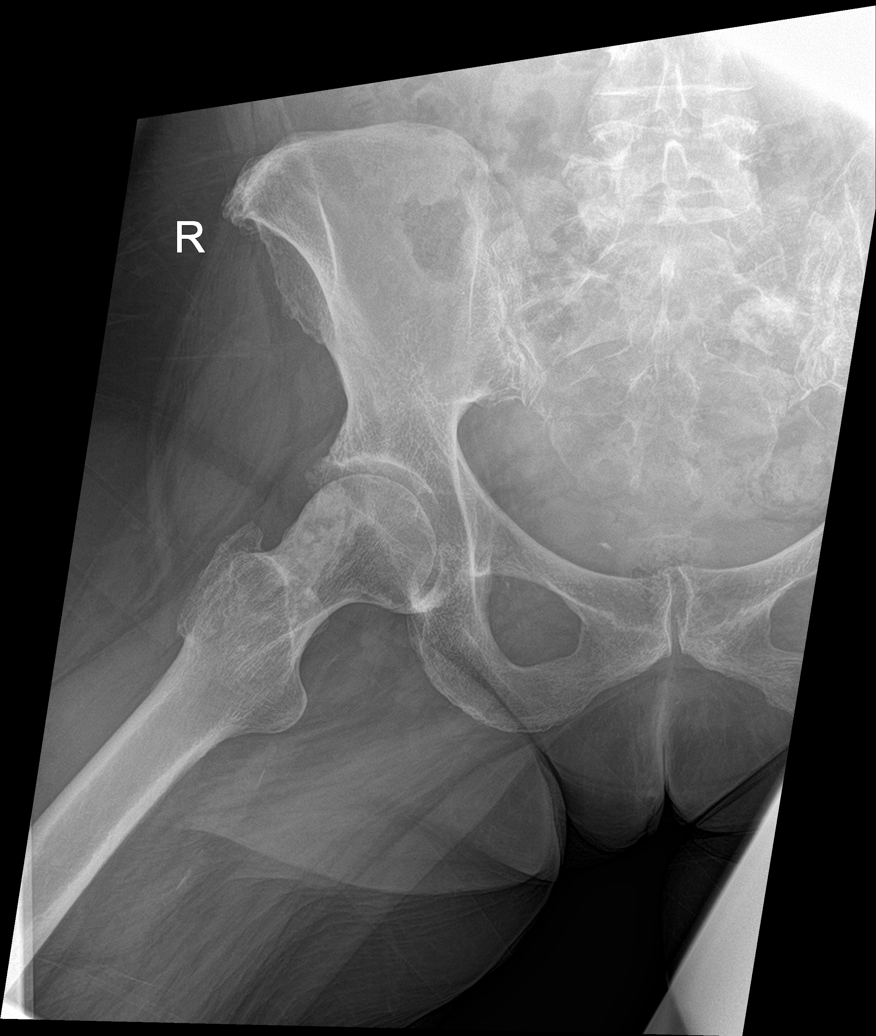

[hip ap (2 of 2)]
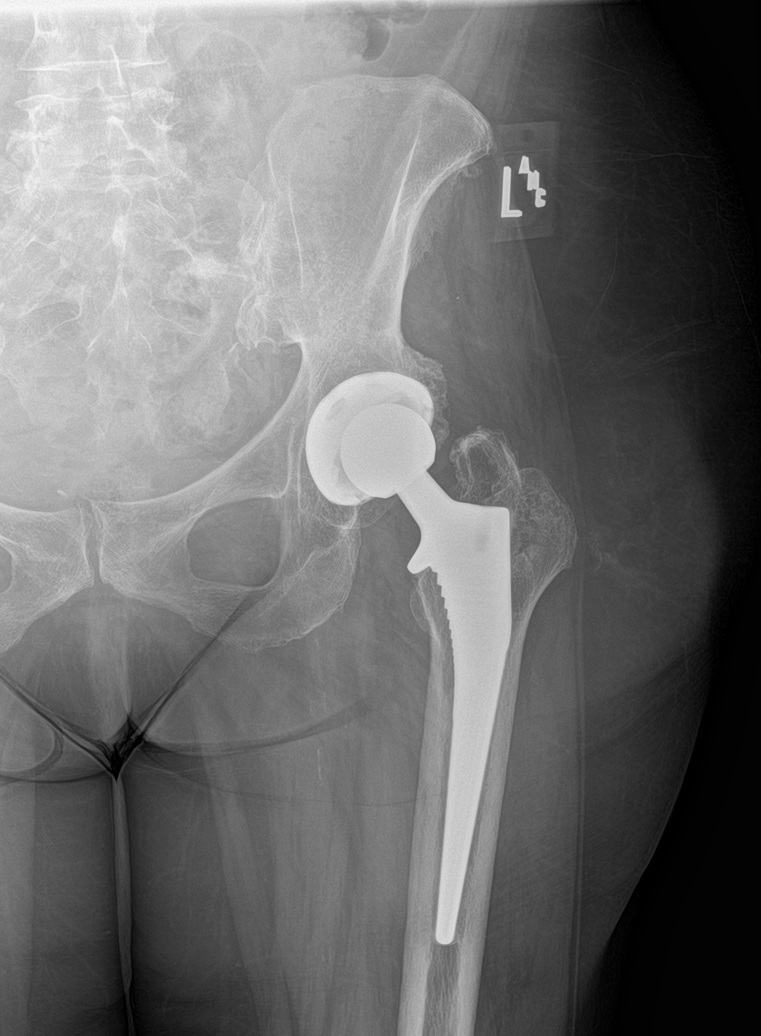

[hip lat (2 of 2)]
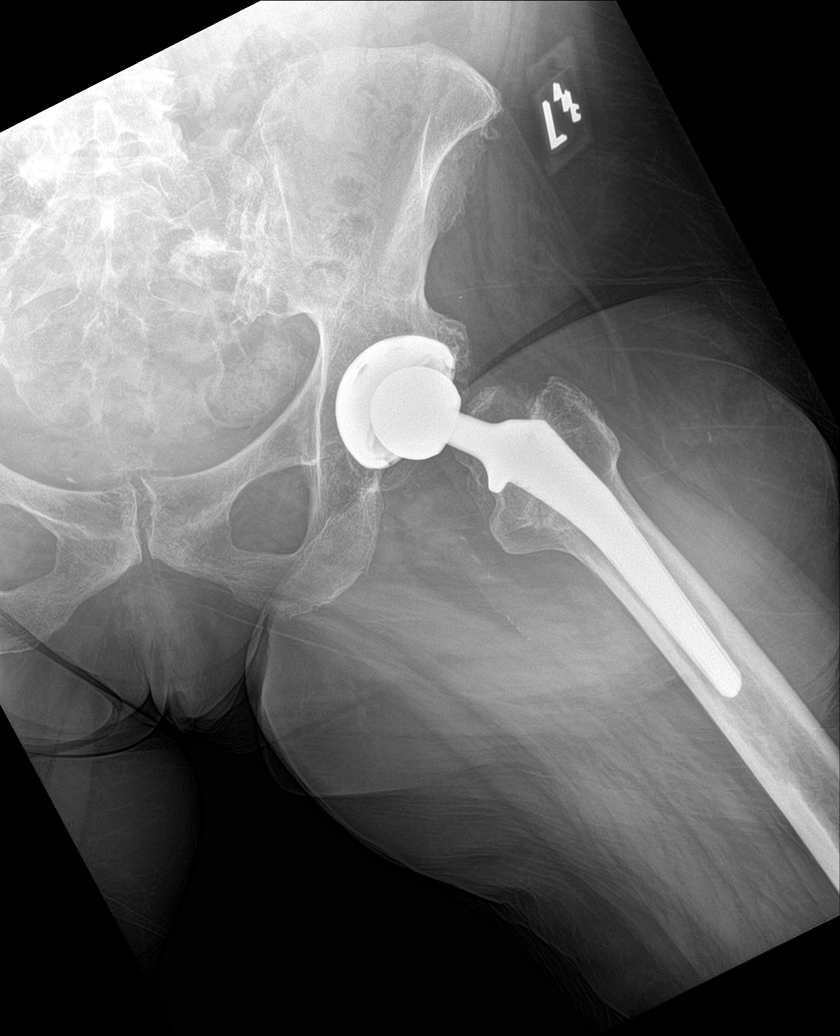

[5 of 5 positions shown; findings below may reference images not displayed]

FINDINGS: Stable left hip prosthesis. Interval multiple small, oval and
rounded sclerotic foci in the right femoral neck and inferior right
femoral head. There is also an oval sclerotic area overlying the mid
left sacral body. There was increased tracer uptake associated with
these lesions on the previous radionuclide bone scan. Normal
appearing right hip joint space. Mild lower lumbar spine
degenerative changes.
IMPRESSION: Sclerotic metastatic foci in the left sacrum and right femoral head
and neck compatible with the patient's known metastatic breast
cancer. No associated fractures visualized.

## 2022-08-30 ENCOUNTER — Ambulatory Visit (HOSPITAL_COMMUNITY)
Admission: RE | Admit: 2022-08-30 | Discharge: 2022-08-30 | Disposition: A | Discharge status: Home / Self Care | Payer: Medicare Other | Source: Ambulatory Visit | Attending: Adult Health | Admitting: Adult Health

## 2022-08-30 DIAGNOSIS — Z170 Estrogen receptor positive status [ER+]: Secondary | ICD-10-CM | POA: Insufficient documentation

## 2022-08-30 DIAGNOSIS — J929 Pleural plaque without asbestos: Secondary | ICD-10-CM | POA: Diagnosis not present

## 2022-08-30 DIAGNOSIS — C50411 Malignant neoplasm of upper-outer quadrant of right female breast: Secondary | ICD-10-CM | POA: Diagnosis not present

## 2022-08-30 DIAGNOSIS — J90 Pleural effusion, not elsewhere classified: Secondary | ICD-10-CM | POA: Insufficient documentation

## 2022-08-30 DIAGNOSIS — J9811 Atelectasis: Secondary | ICD-10-CM | POA: Diagnosis not present

## 2022-08-30 DIAGNOSIS — C50911 Malignant neoplasm of unspecified site of right female breast: Secondary | ICD-10-CM | POA: Insufficient documentation

## 2022-08-30 DIAGNOSIS — C7800 Secondary malignant neoplasm of unspecified lung: Secondary | ICD-10-CM | POA: Insufficient documentation

## 2022-08-30 DIAGNOSIS — C7951 Secondary malignant neoplasm of bone: Secondary | ICD-10-CM | POA: Diagnosis not present

## 2022-09-01 ENCOUNTER — Other Ambulatory Visit: Payer: Self-pay

## 2022-09-05 ENCOUNTER — Ambulatory Visit: Payer: Medicare Other | Admitting: Adult Health

## 2022-09-05 ENCOUNTER — Other Ambulatory Visit: Payer: Medicare Other

## 2022-09-05 ENCOUNTER — Ambulatory Visit: Payer: Medicare Other

## 2022-09-05 IMAGING — NM NM BONE WHOLE BODY
2 series · 2 of 2 positions shown · non-contrast
Comparison: None[REDACTED]

CLINICAL DATA: Restaging breast cancer.

EXAM:
NUCLEAR MEDICINE WHOLE BODY BONE SCAN
TECHNIQUE: Whole body anterior and posterior images were obtained approximately
3 hours after intravenous injection of radiopharmaceutical.
RADIOPHARMACEUTICALS:  21.2 mCi Kechnetium-ZZm MDP IV

[Series 1: whole body · 2.66mm/px · 1 of 1 slices shown (1 of 2)]
[im 1/1]
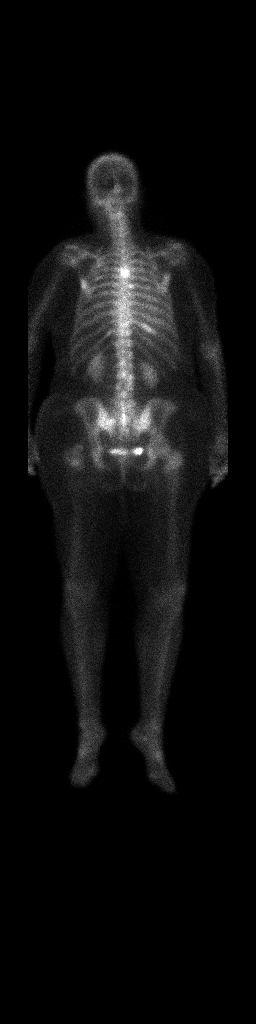

[Series 1: whole body · 2.66mm/px · 1 of 1 slices shown (2 of 2)]
[im 1/1]
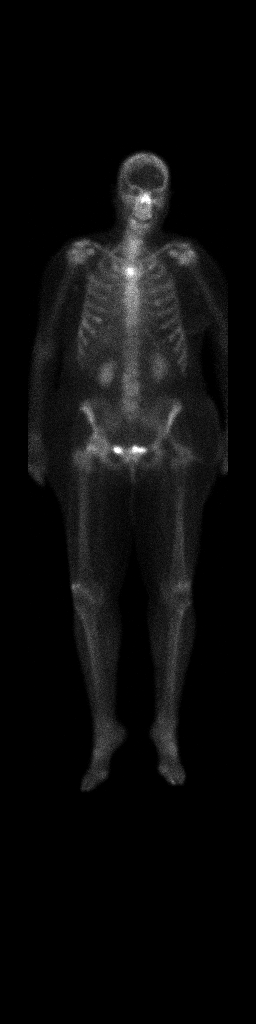

[2 of 2 positions shown; findings below may reference images not displayed]

FINDINGS: Again seen are multifocal abnormal areas of increased uptake
compatible with osseous metastatic disease. Lesions are noted within
the sternal manubrium, bilateral scapula, upper thoracic spine,
proximal right femur, and bilateral ribs. When compared with the
previous exam uptake within the proximal right femur is improved in
the interval. Mild increased uptake associated with the sternal
manubrial lesion. Remaining areas of increased uptake appears
stable. No new sites of disease. Physiologic activity seen within
the kidneys an urinary bladder.
IMPRESSION: 1. Overall no significant change in the appearance of multifocal
bone metastases.

## 2022-09-05 MED FILL — Fosaprepitant Dimeglumine For IV Infusion 150 MG (Base Eq): INTRAVENOUS | Qty: 5 | Status: AC

## 2022-09-05 MED FILL — Dexamethasone Sodium Phosphate Inj 100 MG/10ML: INTRAMUSCULAR | Qty: 1 | Status: AC

## 2022-09-06 ENCOUNTER — Inpatient Hospital Stay: Payer: Medicare Other

## 2022-09-06 ENCOUNTER — Encounter: Payer: Self-pay | Admitting: Adult Health

## 2022-09-06 ENCOUNTER — Telehealth: Payer: Self-pay | Admitting: Hematology and Oncology

## 2022-09-06 ENCOUNTER — Inpatient Hospital Stay (HOSPITAL_BASED_OUTPATIENT_CLINIC_OR_DEPARTMENT_OTHER): Payer: Medicare Other | Admitting: Adult Health

## 2022-09-06 ENCOUNTER — Inpatient Hospital Stay: Payer: Medicare Other | Attending: Oncology

## 2022-09-06 VITALS — BP 122/63 | HR 93 | Resp 20

## 2022-09-06 VITALS — BP 109/73 | HR 110 | Temp 97.3°F | Resp 20 | Ht 64.0 in | Wt 141.0 lb

## 2022-09-06 DIAGNOSIS — C7800 Secondary malignant neoplasm of unspecified lung: Secondary | ICD-10-CM

## 2022-09-06 DIAGNOSIS — Z5111 Encounter for antineoplastic chemotherapy: Secondary | ICD-10-CM | POA: Diagnosis not present

## 2022-09-06 DIAGNOSIS — C50411 Malignant neoplasm of upper-outer quadrant of right female breast: Secondary | ICD-10-CM | POA: Insufficient documentation

## 2022-09-06 DIAGNOSIS — C787 Secondary malignant neoplasm of liver and intrahepatic bile duct: Secondary | ICD-10-CM | POA: Insufficient documentation

## 2022-09-06 DIAGNOSIS — C7951 Secondary malignant neoplasm of bone: Secondary | ICD-10-CM | POA: Insufficient documentation

## 2022-09-06 DIAGNOSIS — C50911 Malignant neoplasm of unspecified site of right female breast: Secondary | ICD-10-CM

## 2022-09-06 DIAGNOSIS — Z79899 Other long term (current) drug therapy: Secondary | ICD-10-CM | POA: Diagnosis not present

## 2022-09-06 DIAGNOSIS — Z95828 Presence of other vascular implants and grafts: Secondary | ICD-10-CM

## 2022-09-06 DIAGNOSIS — Z170 Estrogen receptor positive status [ER+]: Secondary | ICD-10-CM

## 2022-09-06 LAB — CMP (CANCER CENTER ONLY)
ALT: 8 U/L (ref 0–44)
AST: 14 U/L — ABNORMAL LOW (ref 15–41)
Albumin: 3.8 g/dL (ref 3.5–5.0)
Alkaline Phosphatase: 106 U/L (ref 38–126)
Anion gap: 10 (ref 5–15)
BUN: 14 mg/dL (ref 8–23)
CO2: 21 mmol/L — ABNORMAL LOW (ref 22–32)
Calcium: 9.4 mg/dL (ref 8.9–10.3)
Chloride: 107 mmol/L (ref 98–111)
Creatinine: 0.9 mg/dL (ref 0.44–1.00)
GFR, Estimated: >60 mL/min (ref >60)
Glucose, Bld: 116 mg/dL — ABNORMAL HIGH (ref 70–99)
Potassium: 3.9 mmol/L (ref 3.5–5.1)
Sodium: 138 mmol/L (ref 135–145)
Total Bilirubin: 0.2 mg/dL — ABNORMAL LOW (ref 0.3–1.2)
Total Protein: 7.2 g/dL (ref 6.5–8.1)

## 2022-09-06 LAB — CBC WITH DIFFERENTIAL (CANCER CENTER ONLY)
Abs Immature Granulocytes: 0.06 10*3/uL (ref 0.00–0.07)
Basophils Absolute: 0.1 10*3/uL (ref 0.0–0.1)
Basophils Relative: 1 %
Eosinophils Absolute: 0.1 10*3/uL (ref 0.0–0.5)
Eosinophils Relative: 1 %
HCT: 38.3 % (ref 36.0–46.0)
Hemoglobin: 12.7 g/dL (ref 12.0–15.0)
Immature Granulocytes: 1 %
Lymphocytes Relative: 8 %
Lymphs Abs: 0.6 10*3/uL — ABNORMAL LOW (ref 0.7–4.0)
MCH: 33.5 pg (ref 26.0–34.0)
MCHC: 33.2 g/dL (ref 30.0–36.0)
MCV: 101.1 fL — ABNORMAL HIGH (ref 80.0–100.0)
Monocytes Absolute: 1.2 10*3/uL — ABNORMAL HIGH (ref 0.1–1.0)
Monocytes Relative: 16 %
Neutro Abs: 5.5 10*3/uL (ref 1.7–7.7)
Neutrophils Relative %: 73 %
Platelet Count: 346 10*3/uL (ref 150–400)
RBC: 3.79 MIL/uL — ABNORMAL LOW (ref 3.87–5.11)
RDW: 15.4 % (ref 11.5–15.5)
WBC Count: 7.5 10*3/uL (ref 4.0–10.5)
nRBC: 0 % (ref 0.0–0.2)

## 2022-09-06 MED ORDER — HEPARIN SOD (PORK) LOCK FLUSH 100 UNIT/ML IV SOLN
500.0000 [IU] | Freq: Once | INTRAVENOUS | Status: AC | PRN
  Administered 2022-09-06: 500 [IU]

## 2022-09-06 MED ORDER — FAM-TRASTUZUMAB DERUXTECAN-NXKI CHEMO 100 MG IV SOLR
4.4000 mg/kg | Freq: Once | INTRAVENOUS | Status: AC
  Administered 2022-09-06: 300 mg via INTRAVENOUS
  Filled 2022-09-06: qty 15

## 2022-09-06 MED ORDER — SODIUM CHLORIDE 0.9 % IV SOLN
10.0000 mg | Freq: Once | INTRAVENOUS | Status: AC
  Administered 2022-09-06: 10 mg via INTRAVENOUS
  Filled 2022-09-06: qty 10

## 2022-09-06 MED ORDER — ACETAMINOPHEN 325 MG PO TABS
650.0000 mg | ORAL_TABLET | Freq: Once | ORAL | Status: AC
  Administered 2022-09-06: 650 mg via ORAL
  Filled 2022-09-06: qty 2

## 2022-09-06 MED ORDER — DIPHENHYDRAMINE HCL 25 MG PO CAPS
50.0000 mg | ORAL_CAPSULE | Freq: Once | ORAL | Status: AC
  Administered 2022-09-06: 50 mg via ORAL
  Filled 2022-09-06: qty 2

## 2022-09-06 MED ORDER — PALONOSETRON HCL INJECTION 0.25 MG/5ML
0.2500 mg | Freq: Once | INTRAVENOUS | Status: AC
  Administered 2022-09-06: 0.25 mg via INTRAVENOUS
  Filled 2022-09-06: qty 5

## 2022-09-06 MED ORDER — SODIUM CHLORIDE 0.9% FLUSH
10.0000 mL | INTRAVENOUS | Status: DC | PRN
  Administered 2022-09-06: 10 mL

## 2022-09-06 MED ORDER — DEXTROSE 5 % IV SOLN: Freq: Once | INTRAVENOUS | Status: AC

## 2022-09-06 MED ORDER — SODIUM CHLORIDE 0.9% FLUSH
10.0000 mL | Freq: Once | INTRAVENOUS | Status: AC
  Administered 2022-09-06: 10 mL

## 2022-09-06 MED ORDER — SODIUM CHLORIDE 0.9 % IV SOLN
150.0000 mg | Freq: Once | INTRAVENOUS | Status: AC
  Administered 2022-09-06: 150 mg via INTRAVENOUS
  Filled 2022-09-06: qty 150

## 2022-09-06 NOTE — Patient Instructions (Signed)
[facility]  Discharge Instructions: Thank you for choosing [facility] to provide your oncology and hematology care.   If you have a lab appointment with the [facility], please go directly to [facility] and check in at the registration area.   Wear comfortable clothing and clothing appropriate for easy access to any Portacath or PICC line.   We strive to give you quality time with your provider. You may need to reschedule your appointment if you arrive late (15 or more minutes).  Arriving late affects you and other patients whose appointments are after yours.  Also, if you miss three or more appointments without notifying the office, you may be dismissed from the clinic at the provider's discretion.      For prescription refill requests, have your pharmacy contact our office and allow 72 hours for refills to be completed.    Today you received the following chemotherapy and/or immunotherapy agents: Enhertu      To help prevent nausea and vomiting after your treatment, we encourage you to take your nausea medication as directed.  BELOW ARE SYMPTOMS THAT SHOULD BE REPORTED IMMEDIATELY: *FEVER GREATER THAN 100.4 F (38 C) OR HIGHER *CHILLS OR SWEATING *NAUSEA AND VOMITING THAT IS NOT CONTROLLED WITH YOUR NAUSEA MEDICATION *UNUSUAL SHORTNESS OF BREATH *UNUSUAL BRUISING OR BLEEDING *URINARY PROBLEMS (pain or burning when urinating, or frequent urination) *BOWEL PROBLEMS (unusual diarrhea, constipation, pain near the anus) TENDERNESS IN MOUTH AND THROAT WITH OR WITHOUT PRESENCE OF ULCERS (sore throat, sores in mouth, or a toothache) UNUSUAL RASH, SWELLING OR PAIN  UNUSUAL VAGINAL DISCHARGE OR ITCHING   Items with * indicate a potential emergency and should be followed up as soon as possible or go to the Emergency Department if any problems should occur.  Please show the CHEMOTHERAPY ALERT CARD or IMMUNOTHERAPY ALERT CARD at  check-in to the Emergency Department and triage nurse.  Should you have questions after your visit or need to cancel or reschedule your appointment, please contact [facility]  Dept: [phone]  and follow the prompts.  Office hours are 8:00 a.m. to 4:30 p.m. Monday - Friday. Please note that voicemails left after 4:00 p.m. may not be returned until the following business day.  We are closed weekends and major holidays. You have access to a nurse at all times for urgent questions. Please call the main number to the clinic Dept: [phone] and follow the prompts.   For any non-urgent questions, you may also contact your provider using MyChart. We now offer e-Visits for anyone 20 and older to request care online for non-urgent symptoms. For details visit [email or web address].   Also download the MyChart app! Go to the app store, search "MyChart", open the app, select [facility], and log in with your MyChart username and password.

## 2022-09-06 NOTE — Patient Instructions (Signed)
Thoracentesis A thoracentesis is a procedure to remove excess fluid that has built up in the space between the linings of the chest wall and the lungs. This space is called the pleural space. It is normal to have a small amount of fluid in the pleural space. Some medical conditions, such as heart failure, pneumonia, kidney problems, or cancer can create too much fluid. This extra fluid is removed using a needle that is inserted through the skin and tissue and into the pleural space. A thoracentesis may be done to: Understand why there is extra fluid in the pleural space and to create a treatment plan that is right for you. Help get rid of shortness of breath, discomfort, or pain that is caused by the extra fluid. Tell a health care provider about: Any allergies you have. All medicines you are taking, including vitamins, herbs, eye drops, creams, and over-the-counter medicines. This includes any use of steroids, either by mouth or as a cream. Any problems you or family members have had with anesthetic medicines. Any bleeding problems you have, including any history of blood clots. Any surgeries you have had. Any medical conditions you have, including frequent coughs or coughing episodes. Whether you are pregnant or may be pregnant. What are the risks? Generally, this is a safe procedure. However, problems may occur, including: Infection. Bleeding. Allergic reactions to medicines or dyes. Injury or collapse of the lung. Damage to nearby structures or organs. What happens before the procedure? When to stop eating and drinking Follow instructions from your health care provider about what you may eat and drink before your procedure. These may include: 8 hours before your procedure Stop eating most foods. Do not eat meat, fried foods, or fatty foods. Eat only light foods, such as toast or crackers. All liquids are okay except energy drinks and alcohol. 6 hours before your procedure Stop  eating. Drink only clear liquids, such as water, clear fruit juice, black coffee, plain tea, and sports drinks. Do not drink energy drinks or alcohol. 2 hours before your procedure Stop drinking all liquids. You may be allowed to take medicines with small sips of water. If you do not follow your health care provider's instructions, your procedure may be delayed or canceled. Medicines Ask your health care provider about: Changing or stopping your regular medicines. This is especially important if you are taking diabetes medicines or blood thinners. Taking medicines such as aspirin and ibuprofen. These medicines can thin your blood. Do not take these medicines unless your health care provider tells you to take them. Taking over-the-counter medicines, vitamins, herbs, and supplements. Taking a cough suppressant if you have frequent coughs or coughing episodes. General instructions You may have a chest X-ray or another imaging test, such as a CT scan or ultrasound, to determine the location and amount of fluid in your pleural space. If you will be going home right after the procedure, plan to have a responsible adult: Take you home from the hospital or clinic. You will not be allowed to drive. Care for you for the time you are told. Ask your health care provider: How your procedure site will be marked. What steps will be taken to help prevent infection. These steps may include: Removing hair at the surgery site. Washing skin with a germ-killing soap. What happens during the procedure?  You will be asked to sit upright and bend slightly forward for the procedure. An IV will be inserted into one of your veins. You will be given  one or both of the following: A medicine to help you relax (sedative). A medicine to numb the area (local anesthetic). The health care provider will insert a needle into your back so that it goes between the ribs and into the pleural space. You may feel pressure or  slight pain as the needle is positioned into the pleural space. Fluid will be removed from the pleural space through the needle. You may feel pressure as the fluid is removed. The health care provider will take the needle out after the excess fluid has been removed. A sample of the fluid may be sent to the lab for testing. The area where the needle was inserted (puncture site) will be covered with a bandage (dressing). The procedure may vary among health care providers and hospitals. What happens after the procedure? Your blood pressure, heart rate, breathing rate, and blood oxygen level will be monitored until you leave the hospital or clinic. A chest X-ray may be done to check the amount of fluid that remains in your pleural space. If a sample of fluid was sent for testing, ask your health care provider, or the department that did the procedure, when your results will be ready. It is up to you to get your test results. Summary A thoracentesis is a procedure to remove excess fluid that has built up in the space between the linings of the chest wall and the lungs (pleural space). Some medical conditions, such as heart failure, pneumonia, kidney problems, or cancer, can create too much fluid. For the procedure, a needle will be inserted between your ribs and into the pleural space. Fluid will be removed from the pleural space through the needle. The fluid may be sent to a lab for testing. After the procedure, you may have a chest X-ray to check the amount of fluid still in your pleural space. This information is not intended to replace advice given to you by your health care provider. Make sure you discuss any questions you have with your health care provider. Document Revised: 02/06/2021 Document Reviewed: 02/06/2021 Elsevier Patient Education  [place].

## 2022-09-06 NOTE — Assessment & Plan Note (Addendum)
Barbara Parks is a 72 year old woman with stage IV breast cancer here today for follow-up and evaluation on treatment with Enhertu.  Barbara Parks is doing well today and will proceed with her next Enhertu treatment today.  We discussed her CT results which are stable and show no side effects from the Enhertu and no progression of her cancer.  We discussed possibly having a thoracentesis to help with her right pleural effusion and she is going to think about this.  We also discussed her ASCVD risk and I placed a referral for her to see cardiology when her next echocardiogram is due.  As for her cough and nasal drainage this is likely allergy related.  She will let me know how she does at her next visit and we can discuss whether to proceed with thoracentesis at that point.  She will return in 3 weeks for labs, follow-up, and her next treatment.

## 2022-09-06 NOTE — Progress Notes (Signed)
Barbara Parks:    Barbara Halsted, FNP [place] [address]   DIAGNOSIS:  Cancer Staging  Carcinoma of right breast metastatic to lung [facility]) Staging form: Breast, AJCC 7th Edition - Clinical: Stage IV ([place], NX, M1) - Signed by Deatra Robinson, MD on 09/09/2013 Laterality: Right Biopsy of metastatic site performed: No - Pathologic: No stage assigned - Unsigned Laterality: Right  Malignant neoplasm of upper-outer quadrant of right breast in female, estrogen receptor positive ([place]) Staging form: Breast, AJCC 7th Edition - Clinical: Stage IV ([place], NX, M1) - Signed by Chauncey Cruel, MD on 08/13/2014   SUMMARY OF ONCOLOGIC HISTORY: Oncology History  Carcinoma of right breast metastatic to lung ([place])  10/10/2011 Initial Diagnosis   Carcinoma of right breast metastatic to lung ([place])   05/02/2022 -  Chemotherapy   Patient is on Treatment Plan : BREAST METASTATIC Fam-Trastuzumab Deruxtecan-nxki (Enhertu) (5.4) q21d     Malignant neoplasm of upper-outer quadrant of right breast in female, estrogen receptor positive ([place])  03/22/2004 Initial Diagnosis   right mastectomy and sentinel lymph node sampling 03/22/2004 for a right upper-outer quadrant pT2 pN1, stage IIB invasive ductal carcinoma with lobular features, grade 2, estrogen receptor and progesterone receptor positive, HER-2 negative, with an MIB-1 of 9%   05/2004 - 09/18/2004 Chemotherapy   adjuvant chemotherapy with dose dense doxorubicin and cyclophosphamide x4 cycles (first cycle delayed one week) followed by dose dense paclitaxel x4 was completed 09/18/2004   09/2004 - 2009 Anti-estrogen oral therapy   tamoxifen started March 2006, discontinued 2009   08/2011 Relapse/Recurrence   Metastatic disease:large pericardial effusion, large right pleural effusion and possible right middle lobe bronchial obstruction  status post pericardial window placement, fiberoptic bronchoscopy and  right Pleurx placement 09/11/2011, with biopsy of the bronchus intermedius and pericardium positive for metastatic breast cancer, ER 91%, PR 100% p with an MIB-1 of 35%, and no HER-2 amplification, ratio being 1.37   10/10/2011 - 07/2019 Anti-estrogen oral therapy   BOLERO-4 trial 10/10/2011, receiving letrozole and everolimus (stopped for progression)   08/16/2019 Miscellaneous   foundation 1 requested on liver biopsy from 08/16/2019 shows a stable microsatellite status with 1 mutation/MB, amplification of C11orf30, FGF10 and RICTOR, with mutations in ESR1 and PTEN   08/16/2019 Miscellaneous   Liver biopsy: Metastatic carcinoma to the liver consistent with breast primary, ER 100%, PR 100%, Ki-67 5%, HER2 2+ by IHC, FISH HER2 negative   08/24/2019 -  Anti-estrogen oral therapy   fulvestrant started 08/24/2019, repeated every 4 weeks Palbociclib 125 mg daily 21 days on 7 days off started 08/25/2019 Switched to ribociclib s starting 08/22/2020   02/28/2021 -  Radiation Therapy   palliative radiation (Dr. Lynnette Caffey in [place]) 02/28/2021 to right hip area   10/27/2021 -  Anti-estrogen oral therapy   Abemaciclib with fulvestrant   01/14/2022 Miscellaneous   Guardant360: ESR 1 mutation (no other mutations) MSI high was not detected, PTEN, T p53, EGFR amplification, RB1 mutations detected without any approved treatment options     CURRENT THERAPY: Enhertu  INTERVAL HISTORY: Barbara Parks 72 y.o. female returns for follow-Parks prior to receiving Enhertu therapy.  Since her most recent visit she underwent CT high-resolution that showed no change in her lungs and the persistent moderate right pleural effusion that was likely present prior to her beginning on Enhertu therapy.  She denies any significant issues today.  She does note some nasal drainage and a postnasal drip cough  that has started over the past several days.  She denies any increased shortness of breath.  She has noticed her as have been running  a little bit and she does get allergies from time to time.  Her most recent echocardiogram was normal.  We did calculate her 10-year ASCVD risk.  Her 10-year ASCVD risk is 15.4.%.    Tumor marker results since starting Enhertu on 05/02/2022:  Latest Reference Range & Units 05/02/22 12:58 05/23/22 10:47 07/04/22 10:51 07/26/22 10:51 08/16/22 11:11  CA 27.29 0.0 - 38.6 U/mL 1,009.1 (H) 695.2 (H) 285.2 (H) 201.9 (H) 182.9 (H)  (H): Data is abnormally high  Patient Active Problem List   Diagnosis Date Noted   Port-A-Cath in place 06/13/2022   Malignant neoplasm metastatic to liver ([place]) 08/09/2019   Malignant neoplasm metastatic to bone ([place]) 12/23/2018   Goals of care, counseling/discussion 12/23/2018   Hyperglycemia 10/02/2017   Hepatic steatosis 01/23/2017   OA (osteoarthritis) of hip 06/07/2015   Elevated cholesterol with high triglycerides 03/22/2015   Malignant neoplasm of upper-outer quadrant of right breast in female, estrogen receptor positive ([place]) 08/13/2014   Thoracic aorta atherosclerosis ([place]) 08/13/2014   Proteinuria 06/16/2014   Hypercalcemia 06/16/2014   Fatigue 04/21/2014   ADHD (attention deficit hyperactivity disorder)    Osteopenia due to cancer therapy 09/09/2013   Carcinoma of right breast metastatic to lung ([place]) 10/10/2011   Malignant pericardial effusion 09/10/2011   Pleural effusion 09/10/2011   Pneumonia 08/26/2011   Asthma in adult without complication [id number]   Osteoarthritis of left hip 08/17/2010    is allergic to aspirin, latex, and nsaids.  MEDICAL HISTORY: Past Medical History:  Diagnosis Date   Arthritis    left hip   Asthma    breast ca 2005   breast/chemo R mastectomy   Breast cancer ([place])    Dermatitis    Diabetes mellitus without complication ([place])    GERD (gastroesophageal reflux disease)    Hypercholesteremia    Metastasis to lung ([place]) dx'd 08/2011   Osteopenia due to cancer therapy 09/09/2013   Palpitations    Personal  history of chemotherapy    Shortness of breath     SURGICAL HISTORY: Past Surgical History:  Procedure Laterality Date   BREAST SURGERY  2005   right   CATARACT EXTRACTION W/PHACO Left 01/18/2014   Procedure: CATARACT EXTRACTION PHACO AND INTRAOCULAR LENS PLACEMENT ([place]);  Surgeon: Elta Guadeloupe T. Gershon Crane, MD;  Location: [facility];  Service: Ophthalmology;  Laterality: Left;  CDE 15.79   CATARACT EXTRACTION W/PHACO Right 02/08/2014   Procedure: CATARACT EXTRACTION PHACO AND INTRAOCULAR LENS PLACEMENT ([facility]);  Surgeon: Elta Guadeloupe T. Gershon Crane, MD;  Location: [facility];  Service: Ophthalmology;  Laterality: Right;  CDE 4.41   CHEST TUBE INSERTION  09/11/2011   Procedure: INSERTION PLEURAL DRAINAGE CATHETER;  Surgeon: Gaye Pollack, MD;  Location: [place];  Service: Thoracic;  Laterality: Right;   IR IMAGING GUIDED PORT INSERTION  04/23/2022   MASTECTOMY Right    PERICARDIAL WINDOW  09/11/2011   Procedure: PERICARDIAL WINDOW;  Surgeon: Gaye Pollack, MD;  Location: [place];  Service: Thoracic;  Laterality: N/A;   REMOVAL OF PLEURAL DRAINAGE CATHETER  12/19/2011   Procedure: REMOVAL OF PLEURAL DRAINAGE CATHETER;  Surgeon: Gaye Pollack, MD;  Location: [facility];  Service: Thoracic;  Laterality: Right;  TO BE DONE IN MINOR ROOM, [place] Left 06/07/2015   Procedure: LEFT TOTAL HIP ARTHROPLASTY  ANTERIOR APPROACH;  Surgeon: Gaynelle Arabian, MD;  Location: [facility];  Service: Orthopedics;  Laterality: Left;   VIDEO BRONCHOSCOPY  09/11/2011   Procedure: VIDEO BRONCHOSCOPY;  Surgeon: Gaye Pollack, MD;  [facility];  Service: Thoracic;  Laterality: N/A;    SOCIAL HISTORY: Social History   Socioeconomic History   Marital status: Single    Spouse name: Not on file   Number of children: Not on file   Years of education: Not on file   Highest education level: Not on file  Occupational History   Not on file  Tobacco Use   Smoking status: Former    Packs/day: 1.50    Years:  30.00    Total pack years: 45.00    Types: Cigarettes    Quit date: 09/10/2007    Years since quitting: 15.0   Smokeless tobacco: Never  Substance and Sexual Activity   Alcohol use: No   Drug use: No   Sexual activity: Not Currently  Other Topics Concern   Not on file  Social History Narrative   Not on file   Social Determinants of Health   Financial Resource Strain: Not on file  Food Insecurity: Not on file  Transportation Needs: Not on file  Physical Activity: Not on file  Stress: Not on file  Social Connections: Not on file  Intimate Partner Violence: Not on file    FAMILY HISTORY: Family History  Problem Relation Age of Onset   Cancer Mother        breast   Heart disease Father    Diabetes Other    Anesthesia problems Neg Hx     Review of Systems  Constitutional:  Negative for appetite change, chills, fatigue, fever and unexpected weight change.  HENT:   Negative for hearing loss, lump/mass and trouble swallowing.   Eyes:  Negative for eye problems and icterus.  Respiratory:  Negative for chest tightness, cough and shortness of breath.   Cardiovascular:  Negative for chest pain, leg swelling and palpitations.  Gastrointestinal:  Negative for abdominal distention, abdominal pain, constipation, diarrhea, nausea and vomiting.  Endocrine: Negative for hot flashes.  Genitourinary:  Negative for difficulty urinating.   Musculoskeletal:  Negative for arthralgias.  Skin:  Negative for itching and rash.  Neurological:  Negative for dizziness, extremity weakness, headaches and numbness.  Hematological:  Negative for adenopathy. Does not bruise/bleed easily.  Psychiatric/Behavioral:  Negative for depression. The patient is not nervous/anxious.       PHYSICAL EXAMINATION  ECOG PERFORMANCE STATUS: 1 - Symptomatic but completely ambulatory  Vitals:   09/06/22 1006  BP: 109/73  Pulse: (!) 110  Resp: 20  Temp: (!) 97.3 F (36.3 C)  SpO2: 97%    Physical  Exam Constitutional:      General: She is not in acute distress.    Appearance: Normal appearance. She is not toxic-appearing.  HENT:     Head: Normocephalic and atraumatic.  Eyes:     General: No scleral icterus. Cardiovascular:     Rate and Rhythm: Normal rate and regular rhythm.     Pulses: Normal pulses.     Heart sounds: Normal heart sounds.  Pulmonary:     Effort: Pulmonary effort is normal.     Breath sounds: Normal breath sounds.  Abdominal:     General: Abdomen is flat. Bowel sounds are normal. There is no distension.     Palpations: Abdomen is soft.     Tenderness: There is no abdominal tenderness.  Musculoskeletal:        General: No swelling.     Cervical back: Neck supple.  Lymphadenopathy:     Cervical: No cervical adenopathy.  Skin:    General: Skin is warm and dry.     Findings: No rash.  Neurological:     General: No focal deficit present.     Mental Status: She is alert.  Psychiatric:        Mood and Affect: Mood normal.        Behavior: Behavior normal.     LABORATORY DATA:  CBC    Component Value Date/Time   WBC 7.5 09/06/2022 0949   WBC 10.0 02/26/2022 1123   RBC 3.79 (L) 09/06/2022 0949   HGB 12.7 09/06/2022 0949   HGB 14.1 07/08/2017 0942   HCT 38.3 09/06/2022 0949   HCT 42.9 07/08/2017 0942   PLT 346 09/06/2022 0949   PLT 283 07/08/2017 0942   MCV 101.1 (H) 09/06/2022 0949   MCV 81.1 07/08/2017 0942   MCH 33.5 09/06/2022 0949   MCHC 33.2 09/06/2022 0949   RDW 15.4 09/06/2022 0949   RDW 14.6 (H) 07/08/2017 0942   LYMPHSABS 0.6 (L) 09/06/2022 0949   LYMPHSABS 1.2 07/08/2017 0942   MONOABS 1.2 (H) 09/06/2022 0949   MONOABS 0.7 07/08/2017 0942   EOSABS 0.1 09/06/2022 0949   EOSABS 0.1 07/08/2017 0942   BASOSABS 0.1 09/06/2022 0949   BASOSABS 0.0 07/08/2017 0942    CMP     Component Value Date/Time   NA 138 09/06/2022 0949   NA 139 07/08/2017 0942   K 3.9 09/06/2022 0949   K 3.8 07/08/2017 0942   CL 107 09/06/2022 0949   CL  109 (H) 12/31/2012 0940   CO2 21 (L) 09/06/2022 0949   CO2 18 (L) 07/08/2017 0942   GLUCOSE 116 (H) 09/06/2022 0949   GLUCOSE 156 (H) 07/08/2017 0942   GLUCOSE 127 (H) 12/31/2012 0940   BUN 14 09/06/2022 0949   BUN 17.7 07/08/2017 0942   CREATININE 0.90 09/06/2022 0949   CREATININE 1.0 07/08/2017 0942   CALCIUM 9.4 09/06/2022 0949   CALCIUM 10.4 07/08/2017 0942   PROT 7.2 09/06/2022 0949   PROT 7.7 07/08/2017 0942   ALBUMIN 3.8 09/06/2022 0949   ALBUMIN 3.4 (L) 07/08/2017 0942   AST 14 (L) 09/06/2022 0949   AST 54 (H) 07/08/2017 0942   ALT 8 09/06/2022 0949   ALT 39 07/08/2017 0942   ALKPHOS 106 09/06/2022 0949   ALKPHOS 232 (H) 07/08/2017 0942   BILITOT 0.2 (L) 09/06/2022 0949   BILITOT 0.42 07/08/2017 0942   GFRNONAA >60 09/06/2022 0949   GFRAA 49 (L) 04/05/2020 1504   GFRAA 54 (L) 12/25/2017 0959      ASSESSMENT and THERAPY PLAN:   Carcinoma of right breast metastatic to lung Ignacio is a 72 year old woman with stage IV breast cancer here today for follow-Parks and evaluation on treatment with Enhertu.  Carren is doing well today and will proceed with her next Enhertu treatment today.  We discussed her CT results which are stable and show no side effects from the Enhertu and no progression of her cancer.  We discussed possibly having a thoracentesis to help with her right pleural effusion and she is going to think about this.  We also discussed her ASCVD risk and I placed a referral for her to see cardiology when her next echocardiogram is due.  As for her cough and nasal drainage this is likely allergy related.  She will let me know how she does at her next visit and we can discuss whether to proceed with thoracentesis at that point.  She will return in 3 weeks for labs, follow-Parks, and her next treatment.  All questions were answered. The patient knows to call the clinic with any problems, questions or concerns. We can certainly see the patient much sooner if  necessary.   Total encounter time:30 minutes*in face-to-face visit time, chart review, lab review, care coordination, order entry, and documentation of the encounter time.    Wilber Bihari, NP 09/06/22 11:05 AM Medical Oncology and Hematology [facility] [place], [place] [address] Tel. [phone]    Fax. [phone]  *Total Encounter Time as defined by the Centers for Medicare and Medicaid Services includes, in addition to the face-to-face time of a patient visit (documented in the note above) non-face-to-face time: obtaining and reviewing outside history, ordering and reviewing medications, tests or procedures, care coordination (communications with other health care professionals or caregivers) and documentation in the medical record.

## 2022-09-06 NOTE — Telephone Encounter (Signed)
Scheduled appointment per WQ. Left voicemail. 

## 2022-09-07 ENCOUNTER — Other Ambulatory Visit: Payer: Self-pay

## 2022-09-07 LAB — CANCER ANTIGEN 27.29: CA 27.29: 148.6 U/mL — ABNORMAL HIGH (ref 0.0–38.6)

## 2022-09-09 ENCOUNTER — Encounter: Payer: Self-pay | Admitting: Hematology and Oncology

## 2022-09-17 ENCOUNTER — Other Ambulatory Visit: Payer: Self-pay

## 2022-09-19 ENCOUNTER — Telehealth: Payer: Self-pay | Admitting: Hematology and Oncology

## 2022-09-19 NOTE — Telephone Encounter (Signed)
Scheduled appointment per WQ. Left voicemail. 

## 2022-09-20 ENCOUNTER — Other Ambulatory Visit: Payer: Self-pay

## 2022-09-25 MED FILL — Fosaprepitant Dimeglumine For IV Infusion 150 MG (Base Eq): INTRAVENOUS | Qty: 5 | Status: AC

## 2022-09-25 MED FILL — Dexamethasone Sodium Phosphate Inj 100 MG/10ML: INTRAMUSCULAR | Qty: 1 | Status: AC

## 2022-09-25 NOTE — Progress Notes (Signed)
Patient Care Team: Saintclair Halsted, FNP as PCP - General (Family Medicine) Jerline Pain, MD (Cardiology) Gaye Pollack, MD (Cardiothoracic Surgery) Raina Mina, RPH-CPP (Pharmacist) Renaldo Harrison, MD as Referring Physician (Radiation Oncology) Nicholas Lose, MD as Consulting Physician (Hematology and Oncology)  DIAGNOSIS: No diagnosis found.  SUMMARY OF ONCOLOGIC HISTORY: Oncology History  Carcinoma of right breast metastatic to lung ([place])  10/10/2011 Initial Diagnosis   Carcinoma of right breast metastatic to lung ([place])   05/02/2022 -  Chemotherapy   Patient is on Treatment Plan : BREAST METASTATIC Fam-Trastuzumab Deruxtecan-nxki (Enhertu) (5.4) q21d     Malignant neoplasm of upper-outer quadrant of right breast in female, estrogen receptor positive ([place])  03/22/2004 Initial Diagnosis   right mastectomy and sentinel lymph node sampling 03/22/2004 for a right upper-outer quadrant pT2 pN1, stage IIB invasive ductal carcinoma with lobular features, grade 2, estrogen receptor and progesterone receptor positive, HER-2 negative, with an MIB-1 of 9%   05/2004 - 09/18/2004 Chemotherapy   adjuvant chemotherapy with dose dense doxorubicin and cyclophosphamide x4 cycles (first cycle delayed one week) followed by dose dense paclitaxel x4 was completed 09/18/2004   09/2004 - 2009 Anti-estrogen oral therapy   tamoxifen started March 2006, discontinued 2009   08/2011 Relapse/Recurrence   Metastatic disease:large pericardial effusion, large right pleural effusion and possible right middle lobe bronchial obstruction  status post pericardial window placement, fiberoptic bronchoscopy and right Pleurx placement 09/11/2011, with biopsy of the bronchus intermedius and pericardium positive for metastatic breast cancer, ER 91%, PR 100% p with an MIB-1 of 35%, and no HER-2 amplification, ratio being 1.37   10/10/2011 - 07/2019 Anti-estrogen oral therapy   BOLERO-4 trial 10/10/2011, receiving  letrozole and everolimus (stopped for progression)   08/16/2019 Miscellaneous   foundation 1 requested on liver biopsy from 08/16/2019 shows a stable microsatellite status with 1 mutation/MB, amplification of C11orf30, FGF10 and RICTOR, with mutations in ESR1 and PTEN   08/16/2019 Miscellaneous   Liver biopsy: Metastatic carcinoma to the liver consistent with breast primary, ER 100%, PR 100%, Ki-67 5%, HER2 2+ by IHC, FISH HER2 negative   08/24/2019 -  Anti-estrogen oral therapy   fulvestrant started 08/24/2019, repeated every 4 weeks Palbociclib 125 mg daily 21 days on 7 days off started 08/25/2019 Switched to ribociclib s starting 08/22/2020   02/28/2021 -  Radiation Therapy   palliative radiation (Dr. Lynnette Caffey in [place]) 02/28/2021 to right hip area   10/27/2021 -  Anti-estrogen oral therapy   Abemaciclib with fulvestrant   01/14/2022 Miscellaneous   Guardant360: ESR 1 mutation (no other mutations) MSI high was not detected, PTEN, T p53, EGFR amplification, RB1 mutations detected without any approved treatment options     CHIEF COMPLIANT:   INTERVAL HISTORY: Barbara Parks is a   ALLERGIES:  is allergic to aspirin, latex, and nsaids.  MEDICATIONS:  Current Outpatient Medications  Medication Sig Dispense Refill   cholecalciferol (VITAMIN D3) 25 MCG (1000 UT) tablet Take 2 tablets (2,000 Units total) by mouth daily. (Patient not taking: Reported on 08/16/2022) 180 tablet 4   gemfibrozil (LOPID) 600 MG tablet TAKE 1 TABLET (600 MG TOTAL) BY MOUTH 2 (TWO) TIMES DAILY BEFORE A MEAL. 180 tablet 2   lidocaine-prilocaine (EMLA) cream Apply to affected area once 30 g 3   loperamide (IMODIUM) 2 MG capsule Take 2 mg by mouth 4 (four) times daily as needed for diarrhea or loose stools. Reported on 11/30/2015     metFORMIN (GLUCOPHAGE-XR) 500 MG 24  hr tablet Take 1 tablet (500 mg total) by mouth daily with supper. Followed by [facility]  3   omeprazole (PRILOSEC OTC) 20 MG tablet Take 20 mg  by mouth daily as needed (acid reflux).     ondansetron (ZOFRAN) 8 MG tablet Take 1 tablet (8 mg total) by mouth every 8 (eight) hours as needed for nausea or vomiting. Start on the third day after chemotherapy. 30 tablet 1   prochlorperazine (COMPAZINE) 10 MG tablet Take 1 tablet (10 mg total) by mouth every 6 (six) hours as needed for nausea or vomiting. 30 tablet 1   triamcinolone (KENALOG) 0.025 % ointment Apply 1 Application topically 2 (two) times daily. 15 g 0   No current facility-administered medications for this visit.    PHYSICAL EXAMINATION: ECOG PERFORMANCE STATUS: {CHL ONC ECOG PS:[phone]}  There were no vitals filed for this visit. There were no vitals filed for this visit.  BREAST:*** No palpable masses or nodules in either right or left breasts. No palpable axillary supraclavicular or infraclavicular adenopathy no breast tenderness or nipple discharge. (exam performed in the presence of a chaperone)  LABORATORY DATA:  I have reviewed the data as listed    Latest Ref Rng & Units 09/06/2022    9:49 AM 08/16/2022   11:11 AM 07/26/2022   10:51 AM  CMP  Glucose 70 - 99 mg/dL 116  89  161   BUN 8 - 23 mg/dL '14  18  16   '$ Creatinine 0.44 - 1.00 mg/dL 0.90  0.92  0.90   Sodium 135 - 145 mmol/L 138  137  137   Potassium 3.5 - 5.1 mmol/L 3.9  4.3  4.1   Chloride 98 - 111 mmol/L 107  109  106   CO2 22 - 32 mmol/L '21  20  23   '$ Calcium 8.9 - 10.3 mg/dL 9.4  9.5  9.6   Total Protein 6.5 - 8.1 g/dL 7.2  7.2  7.4   Total Bilirubin 0.3 - 1.2 mg/dL 0.2  0.3  0.3   Alkaline Phos 38 - 126 U/L 106  100  101   AST 15 - 41 U/L '14  14  13   '$ ALT 0 - 44 U/L '8  8  8     '$ Lab Results  Component Value Date   WBC 7.5 09/06/2022   HGB 12.7 09/06/2022   HCT 38.3 09/06/2022   MCV 101.1 (H) 09/06/2022   PLT 346 09/06/2022   NEUTROABS 5.5 09/06/2022    ASSESSMENT & PLAN:  No problem-specific Assessment & Plan notes found for this encounter.    No orders of the defined types were  placed in this encounter.  The patient has a good understanding of the overall plan. she agrees with it. she will call with any problems that may develop before the next visit here. Total time spent: 30 mins including face to face time and time spent for planning, charting and co-ordination of care   Suzzette Righter, [place] 09/25/22    I Gardiner Coins am acting as a Education administrator for [employer]  ***

## 2022-09-26 ENCOUNTER — Inpatient Hospital Stay (HOSPITAL_BASED_OUTPATIENT_CLINIC_OR_DEPARTMENT_OTHER): Payer: Medicare Other | Admitting: Hematology and Oncology

## 2022-09-26 ENCOUNTER — Inpatient Hospital Stay: Payer: Medicare Other

## 2022-09-26 VITALS — BP 129/74 | HR 109 | Temp 97.2°F | Resp 18 | Ht 64.0 in | Wt 139.8 lb

## 2022-09-26 VITALS — BP 111/60 | HR 79 | Resp 16

## 2022-09-26 DIAGNOSIS — Z95828 Presence of other vascular implants and grafts: Secondary | ICD-10-CM

## 2022-09-26 DIAGNOSIS — C787 Secondary malignant neoplasm of liver and intrahepatic bile duct: Secondary | ICD-10-CM | POA: Diagnosis not present

## 2022-09-26 DIAGNOSIS — C50911 Malignant neoplasm of unspecified site of right female breast: Secondary | ICD-10-CM

## 2022-09-26 DIAGNOSIS — C50411 Malignant neoplasm of upper-outer quadrant of right female breast: Secondary | ICD-10-CM

## 2022-09-26 DIAGNOSIS — Z79899 Other long term (current) drug therapy: Secondary | ICD-10-CM | POA: Diagnosis not present

## 2022-09-26 DIAGNOSIS — C7800 Secondary malignant neoplasm of unspecified lung: Secondary | ICD-10-CM | POA: Diagnosis not present

## 2022-09-26 DIAGNOSIS — Z5111 Encounter for antineoplastic chemotherapy: Secondary | ICD-10-CM | POA: Diagnosis not present

## 2022-09-26 DIAGNOSIS — Z170 Estrogen receptor positive status [ER+]: Secondary | ICD-10-CM

## 2022-09-26 DIAGNOSIS — C7951 Secondary malignant neoplasm of bone: Secondary | ICD-10-CM

## 2022-09-26 LAB — CBC WITH DIFFERENTIAL (CANCER CENTER ONLY)
Abs Immature Granulocytes: 0.09 10*3/uL — ABNORMAL HIGH (ref 0.00–0.07)
Basophils Absolute: 0.1 10*3/uL (ref 0.0–0.1)
Basophils Relative: 1 %
Eosinophils Absolute: 0.1 10*3/uL (ref 0.0–0.5)
Eosinophils Relative: 1 %
HCT: 36.6 % (ref 36.0–46.0)
Hemoglobin: 12.3 g/dL (ref 12.0–15.0)
Immature Granulocytes: 1 %
Lymphocytes Relative: 9 %
Lymphs Abs: 0.7 10*3/uL (ref 0.7–4.0)
MCH: 33.2 pg (ref 26.0–34.0)
MCHC: 33.6 g/dL (ref 30.0–36.0)
MCV: 98.9 fL (ref 80.0–100.0)
Monocytes Absolute: 1.1 10*3/uL — ABNORMAL HIGH (ref 0.1–1.0)
Monocytes Relative: 15 %
Neutro Abs: 5.3 10*3/uL (ref 1.7–7.7)
Neutrophils Relative %: 73 %
Platelet Count: 367 10*3/uL (ref 150–400)
RBC: 3.7 MIL/uL — ABNORMAL LOW (ref 3.87–5.11)
RDW: 15.6 % — ABNORMAL HIGH (ref 11.5–15.5)
WBC Count: 7.4 10*3/uL (ref 4.0–10.5)
nRBC: 0 % (ref 0.0–0.2)

## 2022-09-26 LAB — CMP (CANCER CENTER ONLY)
ALT: 9 U/L (ref 0–44)
AST: 14 U/L — ABNORMAL LOW (ref 15–41)
Albumin: 4.1 g/dL (ref 3.5–5.0)
Alkaline Phosphatase: 115 U/L (ref 38–126)
Anion gap: 8 (ref 5–15)
BUN: 16 mg/dL (ref 8–23)
CO2: 20 mmol/L — ABNORMAL LOW (ref 22–32)
Calcium: 8.7 mg/dL — ABNORMAL LOW (ref 8.9–10.3)
Chloride: 109 mmol/L (ref 98–111)
Creatinine: 0.82 mg/dL (ref 0.44–1.00)
GFR, Estimated: >60 mL/min (ref >60)
Glucose, Bld: 84 mg/dL (ref 70–99)
Potassium: 3.9 mmol/L (ref 3.5–5.1)
Sodium: 137 mmol/L (ref 135–145)
Total Bilirubin: 0.2 mg/dL — ABNORMAL LOW (ref 0.3–1.2)
Total Protein: 6.8 g/dL (ref 6.5–8.1)

## 2022-09-26 MED ORDER — SODIUM CHLORIDE 0.9 % IV SOLN
150.0000 mg | Freq: Once | INTRAVENOUS | Status: AC
  Administered 2022-09-26: 150 mg via INTRAVENOUS
  Filled 2022-09-26: qty 150

## 2022-09-26 MED ORDER — SODIUM CHLORIDE 0.9 % IV SOLN
10.0000 mg | Freq: Once | INTRAVENOUS | Status: AC
  Administered 2022-09-26: 10 mg via INTRAVENOUS
  Filled 2022-09-26: qty 10

## 2022-09-26 MED ORDER — SODIUM CHLORIDE 0.9% FLUSH
10.0000 mL | Freq: Once | INTRAVENOUS | Status: AC
  Administered 2022-09-26: 10 mL

## 2022-09-26 MED ORDER — DEXTROSE 5 % IV SOLN: Freq: Once | INTRAVENOUS | Status: AC

## 2022-09-26 MED ORDER — ACETAMINOPHEN 325 MG PO TABS
650.0000 mg | ORAL_TABLET | Freq: Once | ORAL | Status: AC
  Administered 2022-09-26: 650 mg via ORAL
  Filled 2022-09-26: qty 2

## 2022-09-26 MED ORDER — FAM-TRASTUZUMAB DERUXTECAN-NXKI CHEMO 100 MG IV SOLR
3.2000 mg/kg | Freq: Once | INTRAVENOUS | Status: AC
  Administered 2022-09-26: 200 mg via INTRAVENOUS
  Filled 2022-09-26: qty 10

## 2022-09-26 MED ORDER — SODIUM CHLORIDE 0.9% FLUSH
10.0000 mL | INTRAVENOUS | Status: DC | PRN
  Administered 2022-09-26: 10 mL

## 2022-09-26 MED ORDER — HEPARIN SOD (PORK) LOCK FLUSH 100 UNIT/ML IV SOLN
500.0000 [IU] | Freq: Once | INTRAVENOUS | Status: AC | PRN
  Administered 2022-09-26: 500 [IU]

## 2022-09-26 MED ORDER — PALONOSETRON HCL INJECTION 0.25 MG/5ML
0.2500 mg | Freq: Once | INTRAVENOUS | Status: AC
  Administered 2022-09-26: 0.25 mg via INTRAVENOUS
  Filled 2022-09-26: qty 5

## 2022-09-26 MED ORDER — DIPHENHYDRAMINE HCL 25 MG PO CAPS
50.0000 mg | ORAL_CAPSULE | Freq: Once | ORAL | Status: AC
  Administered 2022-09-26: 50 mg via ORAL
  Filled 2022-09-26: qty 2

## 2022-09-26 NOTE — Patient Instructions (Signed)
[facility]  Discharge Instructions: Thank you for choosing [facility] to provide your oncology and hematology care.   If you have a lab appointment with the [facility], please go directly to the [facility] and check in at the registration area.   Wear comfortable clothing and clothing appropriate for easy access to any Portacath or PICC line.   We strive to give you quality time with your provider. You may need to reschedule your appointment if you arrive late (15 or more minutes).  Arriving late affects you and other patients whose appointments are after yours.  Also, if you miss three or more appointments without notifying the office, you may be dismissed from the clinic at the provider's discretion.      For prescription refill requests, have your pharmacy contact our office and allow 72 hours for refills to be completed.    Today you received the following chemotherapy and/or immunotherapy agents: Enhertu      To help prevent nausea and vomiting after your treatment, we encourage you to take your nausea medication as directed.  BELOW ARE SYMPTOMS THAT SHOULD BE REPORTED IMMEDIATELY: *FEVER GREATER THAN 100.4 F (38 C) OR HIGHER *CHILLS OR SWEATING *NAUSEA AND VOMITING THAT IS NOT CONTROLLED WITH YOUR NAUSEA MEDICATION *UNUSUAL SHORTNESS OF BREATH *UNUSUAL BRUISING OR BLEEDING *URINARY PROBLEMS (pain or burning when urinating, or frequent urination) *BOWEL PROBLEMS (unusual diarrhea, constipation, pain near the anus) TENDERNESS IN MOUTH AND THROAT WITH OR WITHOUT PRESENCE OF ULCERS (sore throat, sores in mouth, or a toothache) UNUSUAL RASH, SWELLING OR PAIN  UNUSUAL VAGINAL DISCHARGE OR ITCHING   Items with * indicate a potential emergency and should be followed up as soon as possible or go to the Emergency Department if any problems should occur.  Please show the CHEMOTHERAPY ALERT CARD or IMMUNOTHERAPY ALERT CARD at  check-in to the Emergency Department and triage nurse.  Should you have questions after your visit or need to cancel or reschedule your appointment, please contact [facility]  Dept: [phone]  and follow the prompts.  Office hours are 8:00 a.m. to 4:30 p.m. Monday - Friday. Please note that voicemails left after 4:00 p.m. may not be returned until the following business day.  We are closed weekends and major holidays. You have access to a nurse at all times for urgent questions. Please call the main number to the clinic Dept: [phone] and follow the prompts.   For any non-urgent questions, you may also contact your provider using MyChart. We now offer e-Visits for anyone 38 and older to request care online for non-urgent symptoms. For details visit [email or web address].   Also download the MyChart app! Go to the app store, search "MyChart", open the app, select [facility], and log in with your MyChart username and password.

## 2022-09-26 NOTE — Assessment & Plan Note (Addendum)
Metastatic breast cancer (pericardial effusion, pleural effusion, right middle lobe lung lesion, ER 91%, PR 90%, Ki-67 35%, HER2 negative diagnosed February 2023)   (Prior history of right mastectomy 2005, adjuvant chemo with Adriamycin Cytoxan and Taxol followed by tamoxifen)   Current treatment:  1. Ribociclib, fulvestrant, Xgeva, ribociclib discontinued 10/18/2021  2. abemaciclib started 10/27/2021 Foundation 1 in 2021: MSI stable, TMB 1, ESR 1 mutation, PTEN, FGF 10, RICTOR amplifications  Guardant360 01/14/2022: No actionable mutations.  ESR 1 mutation. Liver biopsy 02/26/2022: Metastatic carcinoma consistent with breast primary. Caris mutation testing 02/26/2022: HER2 low, ESR 1 mutation, PTEN   Treatment plan: Enhertu every 3 weeks, today cycle 8 She has a puppy named Ricky.   Enhertu toxicities: Fatigue that lasted for 4 to 5 days Nausea and vomiting: She threw up several times after last treatment.  However she thinks it is because of her diet.  She thinks she has made the changes so she is not nauseated anymore. 08/30/2022: CT chest: Stable moderate right pleural effusion with associated near complete collapse right lower lobe.  Unchanged bone metastases.  We discussed the pros and cons of doing a paracentesis.  She wanted to hold off on doing it at this time.  She has an appointment to see Dr. Harl Bowie.

## 2022-09-26 NOTE — Progress Notes (Signed)
Cardiology Office Note:    Date:  09/27/2022   ID:  [name], DOB 1951-06-16, MRN [id number]  PCP:  Saintclair Halsted, [place] Providers Cardiologist:  Janina Mayo, MD     Referring MD: Wilber Bihari Cornett*   No chief complaint on file. On Her2 therapy with CVD risk factors  History of Present Illness:    Barbara Parks is a 72 y.o. female with a hx of asthma, DM2, GERD, stage IV R breast CA metastatic to the lung on herceptin, s/p R mastectomy, former smoker (45 pack years)  In the past in 08/2011, she noted ?pericardial effusion with pericardiocentesis. She also had a thoracentesis at this time.  She is followed by Dr. Lindi Adie. She has a stable moderate right pleural effusion with associated near complete collapse right lower lobe. She had bone mets.  She was offered thoracentesis but wanted to hold off until she was seen with cardiology. She notes fatigue. She is on HER2 therapy currently.   She notes feeling better after mucinex. Notes if SOB again, will consider thoracentesis.  She denies PND/orthopnea. No LE edema.  She has muscle aches with statins  She has DM2,  last A1c was 6.2 06/05/2022. She is on metformin  Cardio-Onc Hx: - R breast CA diagnosed 02/2004. S/p R mastectomy. On anthracycline therapy. Relapsed in 2013. Mets to the liver in 2021. -Anthracycline- 05/2004- 09/18/2004 - HER2- started 05/02/2022 - chest RT - no left sided RT - ICI- no - TKI- no  CT scan: - 08/30/2022: mild CAC  Cardiology Studies: TTE- 04/29/2022- Normal EF, no significant valve dx TTE 08/13/2022- Normal EF, no significant valve dx EKG 2022- NSR   Past Medical History:  Diagnosis Date   Arthritis    left hip   Asthma    breast ca 2005   breast/chemo R mastectomy   Breast cancer ([place])    Dermatitis    Diabetes mellitus without complication ([place])    GERD (gastroesophageal reflux disease)    Hypercholesteremia    Metastasis to lung ([place]) dx'd  08/2011   Osteopenia due to cancer therapy 09/09/2013   Palpitations    Personal history of chemotherapy    Shortness of breath     Past Surgical History:  Procedure Laterality Date   BREAST SURGERY  2005   right   CATARACT EXTRACTION W/PHACO Left 01/18/2014   Procedure: CATARACT EXTRACTION PHACO AND INTRAOCULAR LENS PLACEMENT ([facility]);  Surgeon: Elta Guadeloupe T. Gershon Crane, MD;  Location: [facility];  Service: Ophthalmology;  Laterality: Left;  CDE 15.79   CATARACT EXTRACTION W/PHACO Right 02/08/2014   Procedure: CATARACT EXTRACTION PHACO AND INTRAOCULAR LENS PLACEMENT ([facility]);  Surgeon: Elta Guadeloupe T. Gershon Crane, MD;  Location: [facility];  Service: Ophthalmology;  Laterality: Right;  CDE 4.41   CHEST TUBE INSERTION  09/11/2011   Procedure: INSERTION PLEURAL DRAINAGE CATHETER;  Surgeon: Gaye Pollack, MD;  Location: [facility];  Service: Thoracic;  Laterality: Right;   IR IMAGING GUIDED PORT INSERTION  04/23/2022   MASTECTOMY Right    PERICARDIAL WINDOW  09/11/2011   Procedure: PERICARDIAL WINDOW;  Surgeon: Gaye Pollack, MD;  Location: [facility];  Service: Thoracic;  Laterality: N/A;   REMOVAL OF PLEURAL DRAINAGE CATHETER  12/19/2011   Procedure: REMOVAL OF PLEURAL DRAINAGE CATHETER;  Surgeon: Gaye Pollack, MD;  Location: [facility];  Service: Thoracic;  Laterality: Right;  TO BE DONE IN MINOR ROOM, [facility]  TOTAL HIP ARTHROPLASTY Left 06/07/2015   Procedure: LEFT TOTAL HIP ARTHROPLASTY ANTERIOR APPROACH;  Surgeon: Gaynelle Arabian, MD;  Location: [facility];  Service: Orthopedics;  Laterality: Left;   VIDEO BRONCHOSCOPY  09/11/2011   Procedure: VIDEO BRONCHOSCOPY;  Surgeon: Gaye Pollack, MD;  Location: [facility];  Service: Thoracic;  Laterality: N/A;    Current Medications: Current Outpatient Medications on File Prior to Visit  Medication Sig Dispense Refill   cholecalciferol (VITAMIN D3) 25 MCG (1000 UT) tablet Take 2 tablets (2,000 Units total) by mouth daily. 180 tablet 4   gemfibrozil (LOPID) 600 MG tablet TAKE  1 TABLET (600 MG TOTAL) BY MOUTH 2 (TWO) TIMES DAILY BEFORE A MEAL. 180 tablet 2   lidocaine-prilocaine (EMLA) cream Apply to affected area once 30 g 3   loperamide (IMODIUM) 2 MG capsule Take 2 mg by mouth 4 (four) times daily as needed for diarrhea or loose stools. Reported on 11/30/2015     metFORMIN (GLUCOPHAGE-XR) 500 MG 24 hr tablet Take 1 tablet (500 mg total) by mouth daily with supper. Followed by [facility]  3   omeprazole (PRILOSEC OTC) 20 MG tablet Take 20 mg by mouth daily as needed (acid reflux).     ondansetron (ZOFRAN) 8 MG tablet Take 1 tablet (8 mg total) by mouth every 8 (eight) hours as needed for nausea or vomiting. Start on the third day after chemotherapy. 30 tablet 1   prochlorperazine (COMPAZINE) 10 MG tablet Take 1 tablet (10 mg total) by mouth every 6 (six) hours as needed for nausea or vomiting. 30 tablet 1   triamcinolone (KENALOG) 0.025 % ointment Apply 1 Application topically 2 (two) times daily. 15 g 0   No current facility-administered medications on file prior to visit.    Allergies:   Aspirin, Latex, and Nsaids   Social History   Socioeconomic History   Marital status: Single    Spouse name: Not on file   Number of children: Not on file   Years of education: Not on file   Highest education level: Not on file  Occupational History   Not on file  Tobacco Use   Smoking status: Former    Packs/day: 1.50    Years: 30.00    Total pack years: 45.00    Types: Cigarettes    Quit date: 09/10/2007    Years since quitting: 15.0   Smokeless tobacco: Never  Substance and Sexual Activity   Alcohol use: No   Drug use: No   Sexual activity: Not Currently  Other Topics Concern   Not on file  Social History Narrative   Not on file   Social Determinants of Health   Financial Resource Strain: Not on file  Food Insecurity: Not on file  Transportation Needs: Not on file  Physical Activity: Not on file  Stress: Not on file  Social Connections: Not on  file     Family History: The patient's family history includes Cancer in her mother; Diabetes in an other family member; Heart disease in her father. There is no history of Anesthesia problems.  ROS:   Please see the history of present illness.     All other systems reviewed and are negative.  EKGs/Labs/Other Studies Reviewed:    The following studies were reviewed today:   EKG:  EKG is  ordered today.  The ekg ordered today demonstrates   09/27/2022- NSR, no ischemic changes  Recent Labs: 09/26/2022: ALT 9; BUN 16; Creatinine 0.82; Hemoglobin 12.3; Platelet Count 367;  Potassium 3.9; Sodium 137   Recent Lipid Panel    Component Value Date/Time   CHOL 281 (H) 01/04/2019 0828   CHOL 270 (H) 07/08/2017 0942   TRIG 234 (H) 01/04/2019 0828   TRIG 258 (H) 07/08/2017 0942   HDL 62 01/04/2019 0828   HDL 52 07/08/2017 0942   CHOLHDL 4.5 01/04/2019 0828   VLDL 47 (H) 01/04/2019 0828   LDLCALC 172 (H) 01/04/2019 0828   LDLCALC 166 (H) 07/08/2017 0942     Risk Assessment/Calculations:         Physical Exam:    VS:   Vitals:   09/27/22 1343  BP: 112/64  Pulse: 87  SpO2: 98%     Wt Readings from Last 3 Encounters:  09/27/22 141 lb 3.2 oz (64 kg)  09/26/22 139 lb 12.8 oz (63.4 kg)  09/06/22 141 lb (64 kg)     GEN:  Well nourished, well developed in no acute distress HEENT: Normal NECK: No JVD; No carotid bruits LYMPHATICS: No lymphadenopathy CARDIAC: RRR, no murmurs, rubs, gallops RESPIRATORY:  Clear to auscultation without rales, wheezing or rhonchi  ABDOMEN: Soft, non-tender, non-distended MUSCULOSKELETAL:  No edema; No deformity  SKIN: Warm and dry NEUROLOGIC:  Alert and oriented x 3 PSYCHIATRIC:  Normal affect   ASSESSMENT:    Her2 Therapy: echo shows normal LV function. Can continue HER2 therapy. Planned until 04/2023. Will schedule limited TTE in April, 2024. Baseline BNP  Pleural Effusion: agree, should consider thoracentesis/pigtail if needed.   DM2:  A1c improved! On metformin  HLD: statin intolerant. On Gemfibrozil. LDL 174 mg/dL. Adding zetia PLAN:    In order of problems listed above:  Limited TTE April 2024 BNP Start zetia 10 mg daily Follow up in 6 month       Medication Adjustments/Labs and Tests Ordered: Current medicines are reviewed at length with the patient today.  Concerns regarding medicines are outlined above.  Orders Placed This Encounter  Procedures   Brain natriuretic peptide   EKG 12-Lead   ECHOCARDIOGRAM LIMITED   Meds ordered this encounter  Medications   ezetimibe (ZETIA) 10 MG tablet    Sig: Take 1 tablet (10 mg total) by mouth daily.    Dispense:  90 tablet    Refill:  3    Patient Instructions  Medication Instructions:  START: EZETIMIBE '10mg'$  ONCE DAILY  *If you need a refill on your cardiac medications before your next appointment, please call your pharmacy*  Lab Work: BLOOD WORK TODAY  If you have labs (blood work) drawn today and your tests are completely normal, you will receive your results only by: [facility] (if you have MyChart) OR A paper copy in the mail If you have any lab test that is abnormal or we need to change your treatment, we will call you to review the results.  Testing/Procedures: Your physician has requested that you have an echocardiogram IN APRIL 2024. Echocardiography is a painless test that uses sound waves to create images of your heart. It provides your doctor with information about the size and shape of your heart and how well your heart's chambers and valves are working. You may receive an ultrasound enhancing agent through an IV if needed to better visualize your heart during the echo.This procedure takes approximately one hour. There are no restrictions for this procedure. This will take place at the [address].   Follow-Up: At [facility], you and your health needs are our priority.  As part of our continuing mission to provide  you with exceptional heart care, we have created designated Provider Care Teams.  These Care Teams include your primary Cardiologist (physician) and Advanced Practice Providers (APPs -  Physician Assistants and Nurse Practitioners) who all work together to provide you with the care you need, when you need it.  Your next appointment:   6 month(s)  Provider:   Janina Mayo, MD      Signed, Janina Mayo, MD  09/27/2022 5:13 PM    [place]

## 2022-09-27 ENCOUNTER — Encounter: Payer: Self-pay | Admitting: Internal Medicine

## 2022-09-27 ENCOUNTER — Ambulatory Visit: Payer: Medicare Other | Attending: Internal Medicine | Admitting: Internal Medicine

## 2022-09-27 VITALS — BP 112/64 | HR 87 | Ht 63.5 in | Wt 141.2 lb

## 2022-09-27 DIAGNOSIS — Z5181 Encounter for therapeutic drug level monitoring: Secondary | ICD-10-CM | POA: Diagnosis not present

## 2022-09-27 DIAGNOSIS — Z79899 Other long term (current) drug therapy: Secondary | ICD-10-CM

## 2022-09-27 DIAGNOSIS — R0602 Shortness of breath: Secondary | ICD-10-CM | POA: Diagnosis not present

## 2022-09-27 LAB — CANCER ANTIGEN 27.29: CA 27.29: 150.5 U/mL — ABNORMAL HIGH (ref 0.0–38.6)

## 2022-09-27 MED ORDER — EZETIMIBE 10 MG PO TABS: 10.0000 mg | ORAL_TABLET | Freq: Every day | ORAL | 3 refills | Status: DC

## 2022-09-27 NOTE — Patient Instructions (Signed)
Medication Instructions:  START: EZETIMIBE '10mg'$  ONCE DAILY  *If you need a refill on your cardiac medications before your next appointment, please call your pharmacy*  Lab Work: BLOOD WORK TODAY  If you have labs (blood work) drawn today and your tests are completely normal, you will receive your results only by: [facility] (if you have MyChart) OR A paper copy in the mail If you have any lab test that is abnormal or we need to change your treatment, we will call you to review the results.  Testing/Procedures: Your physician has requested that you have an echocardiogram IN APRIL 2024. Echocardiography is a painless test that uses sound waves to create images of your heart. It provides your doctor with information about the size and shape of your heart and how well your heart's chambers and valves are working. You may receive an ultrasound enhancing agent through an IV if needed to better visualize your heart during the echo.This procedure takes approximately one hour. There are no restrictions for this procedure. This will take place at the [address], [address].   Follow-Up: At [facility], you and your health needs are our priority.  As part of our continuing mission to provide you with exceptional heart care, we have created designated Provider Care Teams.  These Care Teams include your primary Cardiologist (physician) and Advanced Practice Providers (APPs -  Physician Assistants and Nurse Practitioners) who all work together to provide you with the care you need, when you need it.  Your next appointment:   6 month(s)  Provider:   Janina Mayo, MD

## 2022-09-28 ENCOUNTER — Other Ambulatory Visit: Payer: Self-pay

## 2022-09-28 LAB — BRAIN NATRIURETIC PEPTIDE: BNP: 71.2 pg/mL (ref 0.0–100.0)

## 2022-09-30 ENCOUNTER — Telehealth: Payer: Self-pay | Admitting: Hematology and Oncology

## 2022-09-30 NOTE — Telephone Encounter (Signed)
Scheduled appointments per WQ. Left voicemail.

## 2022-10-14 ENCOUNTER — Encounter: Payer: Self-pay | Admitting: Hematology and Oncology

## 2022-10-17 MED FILL — Fosaprepitant Dimeglumine For IV Infusion 150 MG (Base Eq): INTRAVENOUS | Qty: 5 | Status: AC

## 2022-10-17 MED FILL — Dexamethasone Sodium Phosphate Inj 100 MG/10ML: INTRAMUSCULAR | Qty: 1 | Status: AC

## 2022-10-17 NOTE — Progress Notes (Signed)
Patient Care Team: Saintclair Halsted, FNP as PCP - General (Family Medicine) Janina Mayo, MD as PCP - Cardiology (Cardiology) Jerline Pain, MD (Cardiology) Gaye Pollack, MD (Cardiothoracic Surgery) Raina Mina, RPH-CPP (Pharmacist) Renaldo Harrison, MD as Referring Physician (Radiation Oncology) Nicholas Lose, MD as Consulting Physician (Hematology and Oncology)  DIAGNOSIS:  Encounter Diagnosis  Name Primary?   Carcinoma of right breast metastatic to lung ([place]) Yes    SUMMARY OF ONCOLOGIC HISTORY: Oncology History  Carcinoma of right breast metastatic to lung ([place])  10/10/2011 Initial Diagnosis   Carcinoma of right breast metastatic to lung ([place])   05/02/2022 -  Chemotherapy   Patient is on Treatment Plan : BREAST METASTATIC Fam-Trastuzumab Deruxtecan-nxki (Enhertu) (5.4) q21d     Malignant neoplasm of upper-outer quadrant of right breast in female, estrogen receptor positive ([place])  03/22/2004 Initial Diagnosis   right mastectomy and sentinel lymph node sampling 03/22/2004 for a right upper-outer quadrant pT2 pN1, stage IIB invasive ductal carcinoma with lobular features, grade 2, estrogen receptor and progesterone receptor positive, HER-2 negative, with an MIB-1 of 9%   05/2004 - 09/18/2004 Chemotherapy   adjuvant chemotherapy with dose dense doxorubicin and cyclophosphamide x4 cycles (first cycle delayed one week) followed by dose dense paclitaxel x4 was completed 09/18/2004   09/2004 - 2009 Anti-estrogen oral therapy   tamoxifen started March 2006, discontinued 2009   08/2011 Relapse/Recurrence   Metastatic disease:large pericardial effusion, large right pleural effusion and possible right middle lobe bronchial obstruction  status post pericardial window placement, fiberoptic bronchoscopy and right Pleurx placement 09/11/2011, with biopsy of the bronchus intermedius and pericardium positive for metastatic breast cancer, ER 91%, PR 100% p with an MIB-1 of 35%, and no  HER-2 amplification, ratio being 1.37   10/10/2011 - 07/2019 Anti-estrogen oral therapy   BOLERO-4 trial 10/10/2011, receiving letrozole and everolimus (stopped for progression)   08/16/2019 Miscellaneous   foundation 1 requested on liver biopsy from 08/16/2019 shows a stable microsatellite status with 1 mutation/MB, amplification of C11orf30, FGF10 and RICTOR, with mutations in ESR1 and PTEN   08/16/2019 Miscellaneous   Liver biopsy: Metastatic carcinoma to the liver consistent with breast primary, ER 100%, PR 100%, Ki-67 5%, HER2 2+ by IHC, FISH HER2 negative   08/24/2019 -  Anti-estrogen oral therapy   fulvestrant started 08/24/2019, repeated every 4 weeks Palbociclib 125 mg daily 21 days on 7 days off started 08/25/2019 Switched to ribociclib s starting 08/22/2020   02/28/2021 -  Radiation Therapy   palliative radiation (Dr. Lynnette Caffey in [place]) 02/28/2021 to right hip area   10/27/2021 -  Anti-estrogen oral therapy   Abemaciclib with fulvestrant   01/14/2022 Miscellaneous   Guardant360: ESR 1 mutation (no other mutations) MSI high was not detected, PTEN, T p53, EGFR amplification, RB1 mutations detected without any approved treatment options     CHIEF COMPLIANT:  Follow-up Metastatic carcinoma cycle 9 Enhertu     INTERVAL HISTORY: Barbara Parks is a 72 y.o With Metastatic carcinoma cycle 9 Enhertu. She presents to the clinic for a follow-up and treatment. She denies any abdomen issues. She says she doesn't  feel bloated and no diarrhea issues.She feels a whole lot better.  Her major complaint is fatigue that last for 4 to 5 days after each treatment.  Denies any nausea vomiting.  ALLERGIES:  is allergic to aspirin, latex, and nsaids.  MEDICATIONS:  Current Outpatient Medications  Medication Sig Dispense Refill   cholecalciferol (VITAMIN D3) 25 MCG (1000  UT) tablet Take 2 tablets (2,000 Units total) by mouth daily. 180 tablet 4   ezetimibe (ZETIA) 10 MG tablet Take 1 tablet (10 mg  total) by mouth daily. 90 tablet 3   gemfibrozil (LOPID) 600 MG tablet TAKE 1 TABLET (600 MG TOTAL) BY MOUTH 2 (TWO) TIMES DAILY BEFORE A MEAL. 180 tablet 2   lidocaine-prilocaine (EMLA) cream Apply to affected area once 30 g 3   loperamide (IMODIUM) 2 MG capsule Take 2 mg by mouth 4 (four) times daily as needed for diarrhea or loose stools. Reported on 11/30/2015     metFORMIN (GLUCOPHAGE-XR) 500 MG 24 hr tablet Take 1 tablet (500 mg total) by mouth daily with supper. Followed by [facility]  3   omeprazole (PRILOSEC OTC) 20 MG tablet Take 20 mg by mouth daily as needed (acid reflux).     ondansetron (ZOFRAN) 8 MG tablet Take 1 tablet (8 mg total) by mouth every 8 (eight) hours as needed for nausea or vomiting. Start on the third day after chemotherapy. 30 tablet 1   prochlorperazine (COMPAZINE) 10 MG tablet Take 1 tablet (10 mg total) by mouth every 6 (six) hours as needed for nausea or vomiting. 30 tablet 1   triamcinolone (KENALOG) 0.025 % ointment Apply 1 Application topically 2 (two) times daily. 15 g 0   No current facility-administered medications for this visit.    PHYSICAL EXAMINATION: ECOG PERFORMANCE STATUS: 0 - Asymptomatic  Vitals:   10/18/22 1009  BP: 117/69  Pulse: (!) 107  Resp: 18  Temp: (!) 97.2 F (36.2 C)  SpO2: 99%   Filed Weights   10/18/22 1009  Weight: 140 lb 3.2 oz (63.6 kg)      LABORATORY DATA:  I have reviewed the data as listed    Latest Ref Rng & Units 10/18/2022    9:43 AM 09/26/2022   10:58 AM 09/06/2022    9:49 AM  CMP  Glucose 70 - 99 mg/dL 121  84  116   BUN 8 - 23 mg/dL 19  16  14    Creatinine 0.44 - 1.00 mg/dL 0.92  0.82  0.90   Sodium 135 - 145 mmol/L 139  137  138   Potassium 3.5 - 5.1 mmol/L 4.2  3.9  3.9   Chloride 98 - 111 mmol/L 109  109  107   CO2 22 - 32 mmol/L 22  20  21    Calcium 8.9 - 10.3 mg/dL 9.5  8.7  9.4   Total Protein 6.5 - 8.1 g/dL 7.3  6.8  7.2   Total Bilirubin 0.3 - 1.2 mg/dL 0.2  0.2  0.2   Alkaline Phos 38 -  126 U/L 109  115  106   AST 15 - 41 U/L 13  14  14    ALT 0 - 44 U/L 8  9  8      Lab Results  Component Value Date   WBC 8.4 10/18/2022   HGB 12.9 10/18/2022   HCT 39.3 10/18/2022   MCV 100.3 (H) 10/18/2022   PLT 391 10/18/2022   NEUTROABS 6.0 10/18/2022    ASSESSMENT & PLAN:  Carcinoma of right breast metastatic to lung Metastatic breast cancer (pericardial effusion, pleural effusion, right middle lobe lung lesion, ER 91%, PR 90%, Ki-67 35%, HER2 negative diagnosed February 2023)   (Prior history of right mastectomy 2005, adjuvant chemo with Adriamycin Cytoxan and Taxol followed by tamoxifen)   Current treatment:  1. Ribociclib, fulvestrant, Xgeva, ribociclib discontinued 10/18/2021  2.  abemaciclib started 10/27/2021 Foundation 1 in 2021: MSI stable, TMB 1, ESR 1 mutation, PTEN, FGF 10, RICTOR amplifications   Guardant360 01/14/2022: No actionable mutations.  ESR 1 mutation. Liver biopsy 02/26/2022: Metastatic carcinoma consistent with breast primary. Caris mutation testing 02/26/2022: HER2 low, ESR 1 mutation, PTEN   Treatment plan: Enhertu every 3 weeks, today cycle 9 She has a puppy named Ricky ([place] 7 months old).   Enhertu toxicities: Fatigue that lasted for 4 to 5 days: We reduced the dosage of her chemotherapy with cycle 8 Nausea and vomiting: Much better controlled Intermittent diarrhea: Improved with Imodium Fatigue and loss of taste and appetite: Last for 1 week: Hopefully she will do better with lower dosage.   08/30/2022: CT chest: Stable moderate right pleural effusion with associated near complete collapse right lower lobe.  Unchanged bone metastases.   Return to clinic every 3 weeks for Enhertu        No orders of the defined types were placed in this encounter.  The patient has a good understanding of the overall plan. she agrees with it. she will call with any problems that may develop before the next visit here. Total time spent: 30 mins including face to  face time and time spent for planning, charting and co-ordination of care   Harriette Ohara, MD 10/18/22    I Gardiner Coins am acting as a Education administrator for [employer]  I have reviewed the above documentation for accuracy and completeness, and I agree with the above.

## 2022-10-18 ENCOUNTER — Inpatient Hospital Stay: Payer: Medicare Other

## 2022-10-18 ENCOUNTER — Inpatient Hospital Stay: Payer: Medicare Other | Attending: Oncology

## 2022-10-18 ENCOUNTER — Inpatient Hospital Stay (HOSPITAL_BASED_OUTPATIENT_CLINIC_OR_DEPARTMENT_OTHER): Payer: Medicare Other | Admitting: Hematology and Oncology

## 2022-10-18 VITALS — BP 117/69 | HR 107 | Temp 97.2°F | Resp 18 | Ht 63.5 in | Wt 140.2 lb

## 2022-10-18 VITALS — HR 98

## 2022-10-18 DIAGNOSIS — C50911 Malignant neoplasm of unspecified site of right female breast: Secondary | ICD-10-CM

## 2022-10-18 DIAGNOSIS — C7800 Secondary malignant neoplasm of unspecified lung: Secondary | ICD-10-CM

## 2022-10-18 DIAGNOSIS — Z5111 Encounter for antineoplastic chemotherapy: Secondary | ICD-10-CM | POA: Insufficient documentation

## 2022-10-18 DIAGNOSIS — Z95828 Presence of other vascular implants and grafts: Secondary | ICD-10-CM

## 2022-10-18 DIAGNOSIS — C7951 Secondary malignant neoplasm of bone: Secondary | ICD-10-CM | POA: Diagnosis not present

## 2022-10-18 DIAGNOSIS — Z79899 Other long term (current) drug therapy: Secondary | ICD-10-CM | POA: Insufficient documentation

## 2022-10-18 DIAGNOSIS — C50411 Malignant neoplasm of upper-outer quadrant of right female breast: Secondary | ICD-10-CM | POA: Insufficient documentation

## 2022-10-18 LAB — CBC WITH DIFFERENTIAL (CANCER CENTER ONLY)
Abs Immature Granulocytes: 0.11 10*3/uL — ABNORMAL HIGH (ref 0.00–0.07)
Basophils Absolute: 0.1 10*3/uL (ref 0.0–0.1)
Basophils Relative: 1 %
Eosinophils Absolute: 0.2 10*3/uL (ref 0.0–0.5)
Eosinophils Relative: 2 %
HCT: 39.3 % (ref 36.0–46.0)
Hemoglobin: 12.9 g/dL (ref 12.0–15.0)
Immature Granulocytes: 1 %
Lymphocytes Relative: 10 %
Lymphs Abs: 0.8 10*3/uL (ref 0.7–4.0)
MCH: 32.9 pg (ref 26.0–34.0)
MCHC: 32.8 g/dL (ref 30.0–36.0)
MCV: 100.3 fL — ABNORMAL HIGH (ref 80.0–100.0)
Monocytes Absolute: 1.2 10*3/uL — ABNORMAL HIGH (ref 0.1–1.0)
Monocytes Relative: 14 %
Neutro Abs: 6 10*3/uL (ref 1.7–7.7)
Neutrophils Relative %: 72 %
Platelet Count: 391 10*3/uL (ref 150–400)
RBC: 3.92 MIL/uL (ref 3.87–5.11)
RDW: 15.8 % — ABNORMAL HIGH (ref 11.5–15.5)
WBC Count: 8.4 10*3/uL (ref 4.0–10.5)
nRBC: 0 % (ref 0.0–0.2)

## 2022-10-18 LAB — CMP (CANCER CENTER ONLY)
ALT: 8 U/L (ref 0–44)
AST: 13 U/L — ABNORMAL LOW (ref 15–41)
Albumin: 4.3 g/dL (ref 3.5–5.0)
Alkaline Phosphatase: 109 U/L (ref 38–126)
Anion gap: 8 (ref 5–15)
BUN: 19 mg/dL (ref 8–23)
CO2: 22 mmol/L (ref 22–32)
Calcium: 9.5 mg/dL (ref 8.9–10.3)
Chloride: 109 mmol/L (ref 98–111)
Creatinine: 0.92 mg/dL (ref 0.44–1.00)
GFR, Estimated: >60 mL/min (ref >60)
Glucose, Bld: 121 mg/dL — ABNORMAL HIGH (ref 70–99)
Potassium: 4.2 mmol/L (ref 3.5–5.1)
Sodium: 139 mmol/L (ref 135–145)
Total Bilirubin: 0.2 mg/dL — ABNORMAL LOW (ref 0.3–1.2)
Total Protein: 7.3 g/dL (ref 6.5–8.1)

## 2022-10-18 MED ORDER — DEXTROSE 5 % IV SOLN: Freq: Once | INTRAVENOUS | Status: AC

## 2022-10-18 MED ORDER — HEPARIN SOD (PORK) LOCK FLUSH 100 UNIT/ML IV SOLN
500.0000 [IU] | Freq: Once | INTRAVENOUS | Status: AC | PRN
  Administered 2022-10-18: 500 [IU]

## 2022-10-18 MED ORDER — PALONOSETRON HCL INJECTION 0.25 MG/5ML
0.2500 mg | Freq: Once | INTRAVENOUS | Status: AC
  Administered 2022-10-18: 0.25 mg via INTRAVENOUS
  Filled 2022-10-18: qty 5

## 2022-10-18 MED ORDER — SODIUM CHLORIDE 0.9 % IV SOLN
150.0000 mg | Freq: Once | INTRAVENOUS | Status: AC
  Administered 2022-10-18: 150 mg via INTRAVENOUS
  Filled 2022-10-18: qty 150

## 2022-10-18 MED ORDER — DIPHENHYDRAMINE HCL 25 MG PO CAPS
50.0000 mg | ORAL_CAPSULE | Freq: Once | ORAL | Status: AC
  Administered 2022-10-18: 50 mg via ORAL
  Filled 2022-10-18: qty 2

## 2022-10-18 MED ORDER — SODIUM CHLORIDE 0.9% FLUSH
10.0000 mL | INTRAVENOUS | Status: DC | PRN
  Administered 2022-10-18: 10 mL

## 2022-10-18 MED ORDER — ACETAMINOPHEN 325 MG PO TABS
650.0000 mg | ORAL_TABLET | Freq: Once | ORAL | Status: AC
  Administered 2022-10-18: 650 mg via ORAL
  Filled 2022-10-18: qty 2

## 2022-10-18 MED ORDER — FAM-TRASTUZUMAB DERUXTECAN-NXKI CHEMO 100 MG IV SOLR: 4.4000 mg/kg | Freq: Once | INTRAVENOUS | Status: DC

## 2022-10-18 MED ORDER — FAM-TRASTUZUMAB DERUXTECAN-NXKI CHEMO 100 MG IV SOLR
3.2000 mg/kg | Freq: Once | INTRAVENOUS | Status: AC
  Administered 2022-10-18: 200 mg via INTRAVENOUS
  Filled 2022-10-18: qty 10

## 2022-10-18 MED ORDER — SODIUM CHLORIDE 0.9 % IV SOLN
10.0000 mg | Freq: Once | INTRAVENOUS | Status: AC
  Administered 2022-10-18: 10 mg via INTRAVENOUS
  Filled 2022-10-18: qty 10

## 2022-10-18 MED ORDER — SODIUM CHLORIDE 0.9% FLUSH
10.0000 mL | Freq: Once | INTRAVENOUS | Status: AC
  Administered 2022-10-18: 10 mL

## 2022-10-18 NOTE — Progress Notes (Signed)
Continue Enhertu at dose reduction per Dr. Cristi Loron, [place].D., CPP 10/18/2022@11 :01 AM

## 2022-10-18 NOTE — Assessment & Plan Note (Addendum)
Metastatic breast cancer (pericardial effusion, pleural effusion, right middle lobe lung lesion, ER 91%, PR 90%, Ki-67 35%, HER2 negative diagnosed February 2023)   (Prior history of right mastectomy 2005, adjuvant chemo with Adriamycin Cytoxan and Taxol followed by tamoxifen)   Current treatment:  1. Ribociclib, fulvestrant, Xgeva, ribociclib discontinued 10/18/2021  2. abemaciclib started 10/27/2021 Foundation 1 in 2021: MSI stable, TMB 1, ESR 1 mutation, PTEN, FGF 10, RICTOR amplifications   Guardant360 01/14/2022: No actionable mutations.  ESR 1 mutation. Liver biopsy 02/26/2022: Metastatic carcinoma consistent with breast primary. Caris mutation testing 02/26/2022: HER2 low, ESR 1 mutation, PTEN   Treatment plan: Enhertu every 3 weeks, today cycle 9 She has a puppy named Ricky ([place] 7 months old).   Enhertu toxicities: Fatigue that lasted for 4 to 5 days: We reduced the dosage of her chemotherapy with cycle 8 Nausea and vomiting: Much better controlled Intermittent diarrhea: Improved with Imodium Fatigue and loss of taste and appetite: Last for 1 week: Hopefully she will do better with lower dosage.   08/30/2022: CT chest: Stable moderate right pleural effusion with associated near complete collapse right lower lobe.  Unchanged bone metastases.   Return to clinic every 3 weeks for [facility]

## 2022-10-18 NOTE — Patient Instructions (Signed)
[facility]  Discharge Instructions: Thank you for choosing [facility] to provide your oncology and hematology care.   If you have a lab appointment with the Cancer Center, please go directly to the Cancer Center and check in at the registration area.   Wear comfortable clothing and clothing appropriate for easy access to any Portacath or PICC line.   We strive to give you quality time with your provider. You may need to reschedule your appointment if you arrive late (15 or more minutes).  Arriving late affects you and other patients whose appointments are after yours.  Also, if you miss three or more appointments without notifying the office, you may be dismissed from the clinic at the provider's discretion.      For prescription refill requests, have your pharmacy contact our office and allow 72 hours for refills to be completed.    Today you received the following chemotherapy and/or immunotherapy agents Enhertu.   To help prevent nausea and vomiting after your treatment, we encourage you to take your nausea medication as directed.  BELOW ARE SYMPTOMS THAT SHOULD BE REPORTED IMMEDIATELY: *FEVER GREATER THAN 100.4 F (38 C) OR HIGHER *CHILLS OR SWEATING *NAUSEA AND VOMITING THAT IS NOT CONTROLLED WITH YOUR NAUSEA MEDICATION *UNUSUAL SHORTNESS OF BREATH *UNUSUAL BRUISING OR BLEEDING *URINARY PROBLEMS (pain or burning when urinating, or frequent urination) *BOWEL PROBLEMS (unusual diarrhea, constipation, pain near the anus) TENDERNESS IN MOUTH AND THROAT WITH OR WITHOUT PRESENCE OF ULCERS (sore throat, sores in mouth, or a toothache) UNUSUAL RASH, SWELLING OR PAIN  UNUSUAL VAGINAL DISCHARGE OR ITCHING   Items with * indicate a potential emergency and should be followed up as soon as possible or go to the Emergency Department if any problems should occur.  Please show the CHEMOTHERAPY ALERT CARD or IMMUNOTHERAPY ALERT CARD at check-in  to the Emergency Department and triage nurse.  Should you have questions after your visit or need to cancel or reschedule your appointment, please contact [facility]  Dept: [phone]  and follow the prompts.  Office hours are 8:00 a.m. to 4:30 p.m. Monday - Friday. Please note that voicemails left after 4:00 p.m. may not be returned until the following business day.  We are closed weekends and major holidays. You have access to a nurse at all times for urgent questions. Please call the main number to the clinic Dept: [phone] and follow the prompts.   For any non-urgent questions, you may also contact your provider using MyChart. We now offer e-Visits for anyone 18 and older to request care online for non-urgent symptoms. For details visit [email or web address].   Also download the MyChart app! Go to the app store, search "MyChart", open the app, select [place], and log in with your MyChart username and password.   

## 2022-10-18 NOTE — Progress Notes (Deleted)
Pt declined to stay for post 30 min obs, ambulatory to lobby with VSS upon discharge 

## 2022-10-19 LAB — CANCER ANTIGEN 27.29: CA 27.29: 129.8 U/mL — ABNORMAL HIGH (ref 0.0–38.6)

## 2022-10-30 ENCOUNTER — Ambulatory Visit (HOSPITAL_COMMUNITY)
Admission: RE | Admit: 2022-10-30 | Discharge: 2022-10-30 | Disposition: A | Discharge status: Home / Self Care | Payer: Medicare Other | Source: Ambulatory Visit | Attending: Internal Medicine | Admitting: Internal Medicine

## 2022-10-30 DIAGNOSIS — Z5181 Encounter for therapeutic drug level monitoring: Secondary | ICD-10-CM | POA: Diagnosis not present

## 2022-10-30 DIAGNOSIS — Z79899 Other long term (current) drug therapy: Secondary | ICD-10-CM

## 2022-10-30 LAB — ECHOCARDIOGRAM LIMITED
Calc EF: 46.6 %
Est EF: 50
S' Lateral: 3 cm
Single Plane A2C EF: 44.9 %
Single Plane A4C EF: 49.1 %

## 2022-10-30 NOTE — Progress Notes (Signed)
*  PRELIMINARY RESULTS* Echocardiogram Limited 2-D Echocardiogram  has been performed.  Samuel Germany 10/30/2022, 4:21 PM

## 2022-11-07 MED FILL — Dexamethasone Sodium Phosphate Inj 100 MG/10ML: INTRAMUSCULAR | Qty: 1 | Status: AC

## 2022-11-07 MED FILL — Fosaprepitant Dimeglumine For IV Infusion 150 MG (Base Eq): INTRAVENOUS | Qty: 5 | Status: AC

## 2022-11-07 NOTE — Progress Notes (Signed)
Patient Care Team: [name], FNP as PCP - General (Family Medicine) Maisie Fus, MD as PCP - Cardiology (Cardiology) Jake Bathe, MD (Cardiology) Alleen Borne, MD (Cardiothoracic Surgery) Anselm Lis, RPH-CPP (Pharmacist) Nils Pyle, MD as Referring Physician (Radiation Oncology) Serena Croissant, MD as Consulting Physician (Hematology and Oncology)  DIAGNOSIS:  Encounter Diagnosis  Name Primary?   Carcinoma of right breast metastatic to lung Yes    SUMMARY OF ONCOLOGIC HISTORY: Oncology History  Carcinoma of right breast metastatic to lung  10/10/2011 Initial Diagnosis   Carcinoma of right breast metastatic to lung ([facility])   05/02/2022 -  Chemotherapy   Patient is on Treatment Plan : BREAST METASTATIC Fam-Trastuzumab Deruxtecan-nxki (Enhertu) (5.4) q21d     Malignant neoplasm of upper-outer quadrant of right breast in female, estrogen receptor positive  03/22/2004 Initial Diagnosis   right mastectomy and sentinel lymph node sampling 03/22/2004 for a right upper-outer quadrant pT2 pN1, stage IIB invasive ductal carcinoma with lobular features, grade 2, estrogen receptor and progesterone receptor positive, HER-2 negative, with an MIB-1 of 9%   05/2004 - 09/18/2004 Chemotherapy   adjuvant chemotherapy with dose dense doxorubicin and cyclophosphamide x4 cycles (first cycle delayed one week) followed by dose dense paclitaxel x4 was completed 09/18/2004   09/2004 - 2009 Anti-estrogen oral therapy   tamoxifen started March 2006, discontinued 2009   08/2011 Relapse/Recurrence   Metastatic disease:large pericardial effusion, large right pleural effusion and possible right middle lobe bronchial obstruction  status post pericardial window placement, fiberoptic bronchoscopy and right Pleurx placement 09/11/2011, with biopsy of the bronchus intermedius and pericardium positive for metastatic breast cancer, ER 91%, PR 100% p with an MIB-1 of 35%, and no HER-2 amplification,  ratio being 1.37   10/10/2011 - 07/2019 Anti-estrogen oral therapy   BOLERO-4 trial 10/10/2011, receiving letrozole and everolimus (stopped for progression)   08/16/2019 Miscellaneous   foundation 1 requested on liver biopsy from 08/16/2019 shows a stable microsatellite status with 1 mutation/MB, amplification of C11orf30, FGF10 and RICTOR, with mutations in ESR1 and PTEN   08/16/2019 Miscellaneous   Liver biopsy: Metastatic carcinoma to the liver consistent with breast primary, ER 100%, PR 100%, Ki-67 5%, HER2 2+ by IHC, FISH HER2 negative   08/24/2019 -  Anti-estrogen oral therapy   fulvestrant started 08/24/2019, repeated every 4 weeks Palbociclib 125 mg daily 21 days on 7 days off started 08/25/2019 Switched to ribociclib s starting 08/22/2020   02/28/2021 -  Radiation Therapy   palliative radiation (Dr. Langston Masker in [place]) 02/28/2021 to right hip area   10/27/2021 -  Anti-estrogen oral therapy   Abemaciclib with fulvestrant   01/14/2022 Miscellaneous   Guardant360: ESR 1 mutation (no other mutations) MSI high was not detected, PTEN, T p53, EGFR amplification, RB1 mutations detected without any approved treatment options     CHIEF COMPLIANT:  Follow-up Metastatic carcinoma cycle 10 Enhertu   INTERVAL HISTORY: Barbara Parks is a 72 y.o With Metastatic carcinoma cycle 9 Enhertu. She presents to the clinic for a follow-up and treatment. She denies coughing says breathing has got better. She believe it is allergies and coming from the pollen. Denies any nausea.  ALLERGIES:  is allergic to aspirin, latex, and nsaids.  MEDICATIONS:  Current Outpatient Medications  Medication Sig Dispense Refill   ezetimibe (ZETIA) 10 MG tablet Take 1 tablet (10 mg total) by mouth daily. 90 tablet 3   gemfibrozil (LOPID) 600 MG tablet TAKE 1 TABLET (600 MG TOTAL) BY MOUTH  2 (TWO) TIMES DAILY BEFORE A MEAL. 180 tablet 2   lidocaine-prilocaine (EMLA) cream Apply to affected area once 30 g 3   loperamide  (IMODIUM) 2 MG capsule Take 2 mg by mouth 4 (four) times daily as needed for diarrhea or loose stools. Reported on 11/30/2015     metFORMIN (GLUCOPHAGE-XR) 500 MG 24 hr tablet Take 1 tablet (500 mg total) by mouth daily with supper. Followed by [facility]  3   omeprazole (PRILOSEC OTC) 20 MG tablet Take 20 mg by mouth daily as needed (acid reflux).     ondansetron (ZOFRAN) 8 MG tablet Take 1 tablet (8 mg total) by mouth every 8 (eight) hours as needed for nausea or vomiting. Start on the third day after chemotherapy. 30 tablet 1   prochlorperazine (COMPAZINE) 10 MG tablet Take 1 tablet (10 mg total) by mouth every 6 (six) hours as needed for nausea or vomiting. 30 tablet 1   triamcinolone (KENALOG) 0.025 % ointment Apply 1 Application topically 2 (two) times daily. 15 g 0   cholecalciferol (VITAMIN D3) 25 MCG (1000 UT) tablet Take 2 tablets (2,000 Units total) by mouth daily. (Patient not taking: Reported on 11/08/2022) 180 tablet 4   No current facility-administered medications for this visit.    PHYSICAL EXAMINATION: ECOG PERFORMANCE STATUS: 1 - Symptomatic but completely ambulatory  Vitals:   11/08/22 0959  BP: 125/70  Pulse: (!) 114  Resp: 18  Temp: (!) 97.2 F (36.2 C)  SpO2: 98%   Filed Weights   11/08/22 0959  Weight: 140 lb 6.4 oz (63.7 kg)   Lung exam: No crackles or noises.  Lungs sound fairly clear.  LABORATORY DATA:  I have reviewed the data as listed    Latest Ref Rng & Units 10/18/2022    9:43 AM 09/26/2022   10:58 AM 09/06/2022    9:49 AM  CMP  Glucose 70 - 99 mg/dL 161  84  096   BUN 8 - 23 mg/dL Creatinine 0.44 - 1.00 mg/dL 0.45  4.09  8.11   Sodium 135 - 145 mmol/L 139  137  138   Potassium 3.5 - 5.1 mmol/L 4.2  3.9  3.9   Chloride 98 - 111 mmol/L 109  109  107   CO2 22 - 32 mmol/L Calcium 8.9 - 10.3 mg/dL 9.5  8.7  9.4   Total Protein 6.5 - 8.1 g/dL 7.3  6.8  7.2   Total Bilirubin 0.3 - 1.2 mg/dL 0.2  0.2  0.2   Alkaline Phos  38 - 126 U/L 109  115  106   AST 15 - 41 U/L ALT 0 - 44 U/L Lab Results  Component Value Date   WBC 8.4 11/08/2022   HGB 13.1 11/08/2022   HCT 40.1 11/08/2022   MCV 99.3 11/08/2022   PLT 400 11/08/2022   NEUTROABS 6.1 11/08/2022    ASSESSMENT & PLAN:  Carcinoma of right breast metastatic to lung Metastatic breast cancer (pericardial effusion, pleural effusion, right middle lobe lung lesion, ER 91%, PR 90%, Ki-67 35%, HER2 negative diagnosed February 2023)   (Prior history of right mastectomy 2005, adjuvant chemo with Adriamycin Cytoxan and Taxol followed by tamoxifen)   Current treatment:  1. Ribociclib, fulvestrant, Xgeva, ribociclib discontinued 10/18/2021  2. abemaciclib started 10/27/2021 Foundation 1 in 2021: MSI stable,  TMB 1, ESR 1 mutation, PTEN, FGF 10, RICTOR amplifications   Guardant360 01/14/2022: No actionable mutations.  ESR 1 mutation. Liver biopsy 02/26/2022: Metastatic carcinoma consistent with breast primary. Caris mutation testing 02/26/2022: HER2 low, ESR 1 mutation, PTEN   Treatment plan: Enhertu every 3 weeks, today cycle 10 She has a puppy named Ricky Kizzie Ide([place] 7 months old).   Enhertu toxicities: Fatigue that lasted for 4 to 5 days: We reduced the dosage of her chemotherapy with cycle 8 Nausea and vomiting: Much better controlled Intermittent diarrhea: Improved with Imodium Fatigue and loss of taste and appetite: Last for 1 week: Hopefully she will do better with lower dosage.   08/30/2022: CT chest: Stable moderate right pleural effusion with associated near complete collapse right lower lobe.  Unchanged bone metastases.   Return to clinic every 3 weeks for Enhertu and we will plan on obtaining another CT scan prior to next visit    Orders Placed This Encounter  Procedures   CT Chest W Contrast    Standing Status:   Future    Standing Expiration Date:   11/08/2023    Order Specific Question:   If indicated for the ordered  procedure, I authorize the administration of contrast media per Radiology protocol    Answer:   Yes    Order Specific Question:   Does the patient have a contrast media/X-ray dye allergy?    Answer:   No    Order Specific Question:   Preferred imaging location?    Answer:   [facility]   The patient has a good understanding of the overall plan. she agrees with it. she will call with any problems that may develop before the next visit here. Total time spent: 30 mins including face to face time and time spent for planning, charting and co-ordination of care   Tamsen MeekViinay K Conita Amenta, MD 11/08/22    I Janan Ridgeeritra, [place] am acting as a Neurosurgeonscribe for [employer]Jaquavian Firkus  I have reviewed the above documentation for accuracy and completeness, and I agree with the above.

## 2022-11-08 ENCOUNTER — Inpatient Hospital Stay: Payer: Medicare Other | Attending: Oncology

## 2022-11-08 ENCOUNTER — Inpatient Hospital Stay: Payer: Medicare Other

## 2022-11-08 ENCOUNTER — Inpatient Hospital Stay (HOSPITAL_BASED_OUTPATIENT_CLINIC_OR_DEPARTMENT_OTHER): Payer: Medicare Other | Admitting: Hematology and Oncology

## 2022-11-08 VITALS — BP 116/70 | HR 87 | Resp 18

## 2022-11-08 VITALS — BP 125/70 | HR 114 | Temp 97.2°F | Resp 18 | Ht 63.5 in | Wt 140.4 lb

## 2022-11-08 DIAGNOSIS — Z5111 Encounter for antineoplastic chemotherapy: Secondary | ICD-10-CM | POA: Insufficient documentation

## 2022-11-08 DIAGNOSIS — C50911 Malignant neoplasm of unspecified site of right female breast: Secondary | ICD-10-CM

## 2022-11-08 DIAGNOSIS — C7951 Secondary malignant neoplasm of bone: Secondary | ICD-10-CM | POA: Insufficient documentation

## 2022-11-08 DIAGNOSIS — C787 Secondary malignant neoplasm of liver and intrahepatic bile duct: Secondary | ICD-10-CM | POA: Insufficient documentation

## 2022-11-08 DIAGNOSIS — C50411 Malignant neoplasm of upper-outer quadrant of right female breast: Secondary | ICD-10-CM | POA: Diagnosis not present

## 2022-11-08 DIAGNOSIS — Z79899 Other long term (current) drug therapy: Secondary | ICD-10-CM | POA: Insufficient documentation

## 2022-11-08 DIAGNOSIS — C7800 Secondary malignant neoplasm of unspecified lung: Secondary | ICD-10-CM | POA: Diagnosis not present

## 2022-11-08 DIAGNOSIS — Z95828 Presence of other vascular implants and grafts: Secondary | ICD-10-CM

## 2022-11-08 LAB — CMP (CANCER CENTER ONLY)
ALT: 7 U/L (ref 0–44)
AST: 13 U/L — ABNORMAL LOW (ref 15–41)
Albumin: 4.3 g/dL (ref 3.5–5.0)
Alkaline Phosphatase: 105 U/L (ref 38–126)
Anion gap: 7 (ref 5–15)
BUN: 17 mg/dL (ref 8–23)
CO2: 24 mmol/L (ref 22–32)
Calcium: 9.9 mg/dL (ref 8.9–10.3)
Chloride: 108 mmol/L (ref 98–111)
Creatinine: 0.99 mg/dL (ref 0.44–1.00)
GFR, Estimated: >60 mL/min (ref >60)
Glucose, Bld: 111 mg/dL — ABNORMAL HIGH (ref 70–99)
Potassium: 4.4 mmol/L (ref 3.5–5.1)
Sodium: 139 mmol/L (ref 135–145)
Total Bilirubin: 0.3 mg/dL (ref 0.3–1.2)
Total Protein: 7.7 g/dL (ref 6.5–8.1)

## 2022-11-08 LAB — CBC WITH DIFFERENTIAL (CANCER CENTER ONLY)
Abs Immature Granulocytes: 0.09 10*3/uL — ABNORMAL HIGH (ref 0.00–0.07)
Basophils Absolute: 0.1 10*3/uL (ref 0.0–0.1)
Basophils Relative: 1 %
Eosinophils Absolute: 0.2 10*3/uL (ref 0.0–0.5)
Eosinophils Relative: 2 %
HCT: 40.1 % (ref 36.0–46.0)
Hemoglobin: 13.1 g/dL (ref 12.0–15.0)
Immature Granulocytes: 1 %
Lymphocytes Relative: 8 %
Lymphs Abs: 0.7 10*3/uL (ref 0.7–4.0)
MCH: 32.4 pg (ref 26.0–34.0)
MCHC: 32.7 g/dL (ref 30.0–36.0)
MCV: 99.3 fL (ref 80.0–100.0)
Monocytes Absolute: 1.3 10*3/uL — ABNORMAL HIGH (ref 0.1–1.0)
Monocytes Relative: 16 %
Neutro Abs: 6.1 10*3/uL (ref 1.7–7.7)
Neutrophils Relative %: 72 %
Platelet Count: 400 10*3/uL (ref 150–400)
RBC: 4.04 MIL/uL (ref 3.87–5.11)
RDW: 15.3 % (ref 11.5–15.5)
WBC Count: 8.4 10*3/uL (ref 4.0–10.5)
nRBC: 0 % (ref 0.0–0.2)

## 2022-11-08 MED ORDER — SODIUM CHLORIDE 0.9 % IV SOLN
150.0000 mg | Freq: Once | INTRAVENOUS | Status: AC
  Administered 2022-11-08: 150 mg via INTRAVENOUS
  Filled 2022-11-08: qty 150

## 2022-11-08 MED ORDER — SODIUM CHLORIDE 0.9% FLUSH
10.0000 mL | Freq: Once | INTRAVENOUS | Status: AC
  Administered 2022-11-08: 10 mL

## 2022-11-08 MED ORDER — PALONOSETRON HCL INJECTION 0.25 MG/5ML
0.2500 mg | Freq: Once | INTRAVENOUS | Status: AC
  Administered 2022-11-08: 0.25 mg via INTRAVENOUS
  Filled 2022-11-08: qty 5

## 2022-11-08 MED ORDER — DIPHENHYDRAMINE HCL 25 MG PO CAPS
50.0000 mg | ORAL_CAPSULE | Freq: Once | ORAL | Status: AC
  Administered 2022-11-08: 50 mg via ORAL
  Filled 2022-11-08: qty 2

## 2022-11-08 MED ORDER — HEPARIN SOD (PORK) LOCK FLUSH 100 UNIT/ML IV SOLN
500.0000 [IU] | Freq: Once | INTRAVENOUS | Status: AC | PRN
  Administered 2022-11-08: 500 [IU]

## 2022-11-08 MED ORDER — SODIUM CHLORIDE 0.9% FLUSH
10.0000 mL | INTRAVENOUS | Status: DC | PRN
  Administered 2022-11-08: 10 mL

## 2022-11-08 MED ORDER — DEXTROSE 5 % IV SOLN: Freq: Once | INTRAVENOUS | Status: AC

## 2022-11-08 MED ORDER — ACETAMINOPHEN 325 MG PO TABS
650.0000 mg | ORAL_TABLET | Freq: Once | ORAL | Status: AC
  Administered 2022-11-08: 650 mg via ORAL
  Filled 2022-11-08: qty 2

## 2022-11-08 MED ORDER — SODIUM CHLORIDE 0.9 % IV SOLN
10.0000 mg | Freq: Once | INTRAVENOUS | Status: AC
  Administered 2022-11-08: 10 mg via INTRAVENOUS
  Filled 2022-11-08: qty 10

## 2022-11-08 MED ORDER — FAM-TRASTUZUMAB DERUXTECAN-NXKI CHEMO 100 MG IV SOLR
3.2000 mg/kg | Freq: Once | INTRAVENOUS | Status: AC
  Administered 2022-11-08: 200 mg via INTRAVENOUS
  Filled 2022-11-08: qty 10

## 2022-11-08 NOTE — Assessment & Plan Note (Signed)
Metastatic breast cancer (pericardial effusion, pleural effusion, right middle lobe lung lesion, ER 91%, PR 90%, Ki-67 35%, HER2 negative diagnosed February 2023)   (Prior history of right mastectomy 2005, adjuvant chemo with Adriamycin Cytoxan and Taxol followed by tamoxifen)   Current treatment:  1. Ribociclib, fulvestrant, Xgeva, ribociclib discontinued 10/18/2021  2. abemaciclib started 10/27/2021 Foundation 1 in 2021: MSI stable, TMB 1, ESR 1 mutation, PTEN, FGF 10, RICTOR amplifications   Guardant360 01/14/2022: No actionable mutations.  ESR 1 mutation. Liver biopsy 02/26/2022: Metastatic carcinoma consistent with breast primary. Caris mutation testing 02/26/2022: HER2 low, ESR 1 mutation, PTEN   Treatment plan: Enhertu every 3 weeks, today cycle 10 She has a puppy named Ricky [place] 7 months old).   Enhertu toxicities: Fatigue that lasted for 4 to 5 days: We reduced the dosage of her chemotherapy with cycle 8 Nausea and vomiting: Much better controlled Intermittent diarrhea: Improved with Imodium Fatigue and loss of taste and appetite: Last for 1 week: Hopefully she will do better with lower dosage.   08/30/2022: CT chest: Stable moderate right pleural effusion with associated near complete collapse right lower lobe.  Unchanged bone metastases.   Return to clinic every 3 weeks for Enhertu and we will plan on obtaining another CT scan prior to next visit

## 2022-11-12 ENCOUNTER — Telehealth: Payer: Self-pay | Admitting: Hematology and Oncology

## 2022-11-12 NOTE — Telephone Encounter (Signed)
Scheduled appointments per WQ. Left voicemail. 

## 2022-11-19 IMAGING — MG DIGITAL SCREENING UNILAT LEFT W/ TOMO W/ CAD
4 series · 4 of 12 positions shown · non-contrast
Comparison: Previous exam(s).

CLINICAL DATA: Screening.

EXAM:
DIGITAL SCREENING UNILATERAL LEFT MAMMOGRAM WITH CAD AND
TOMOSYNTHESIS
TECHNIQUE: Left screening digital craniocaudal and mediolateral oblique
mammograms were obtained. Left screening digital breast
tomosynthesis was performed. The images were evaluated with
computer-aided detection.

[L CC synth-2D]
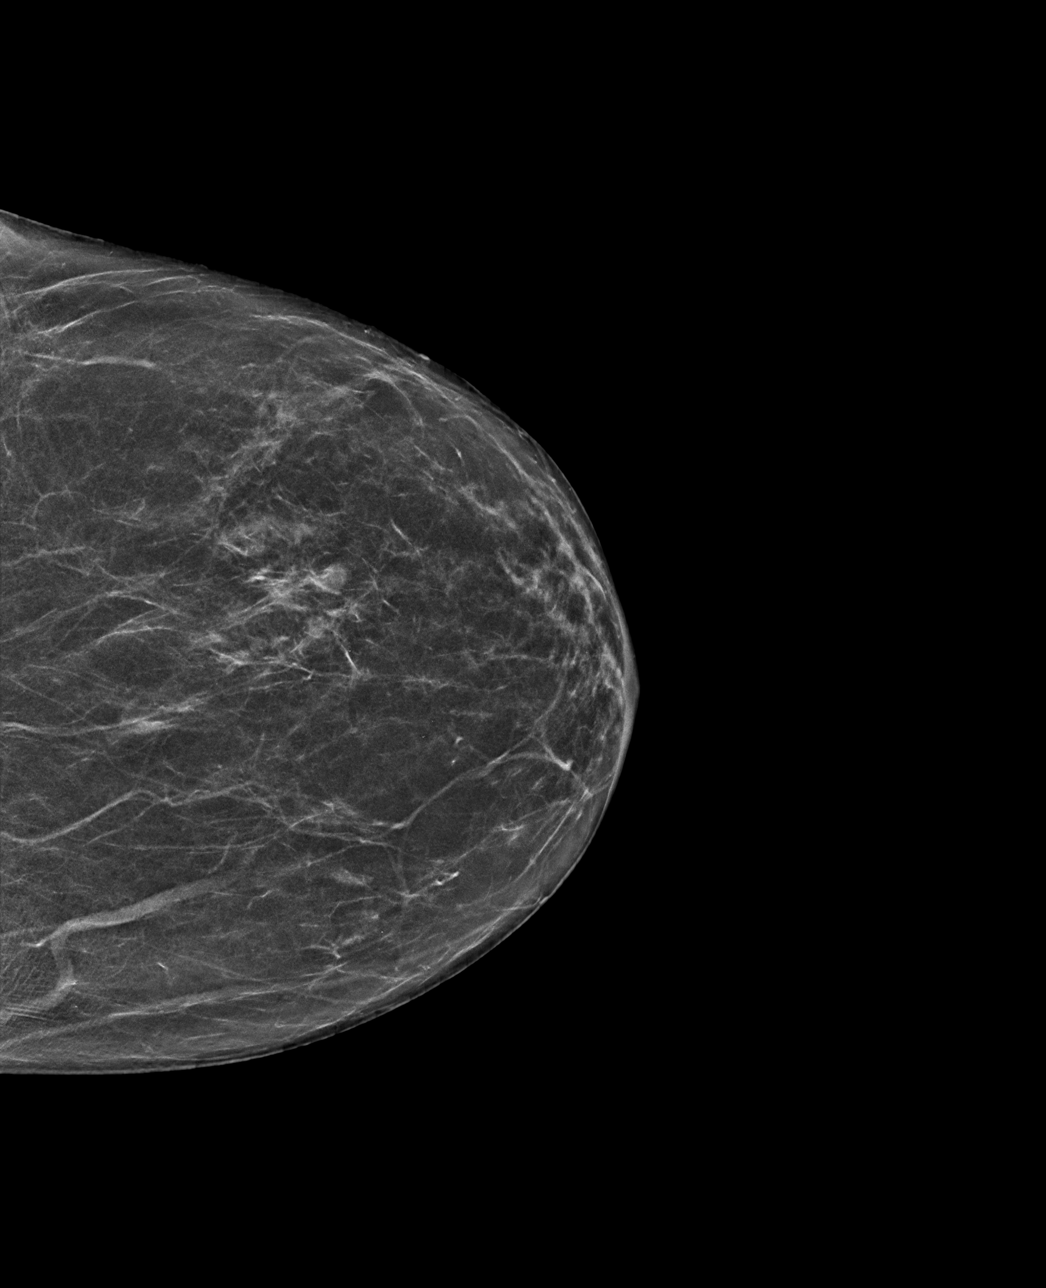

[L MLO synth-2D]
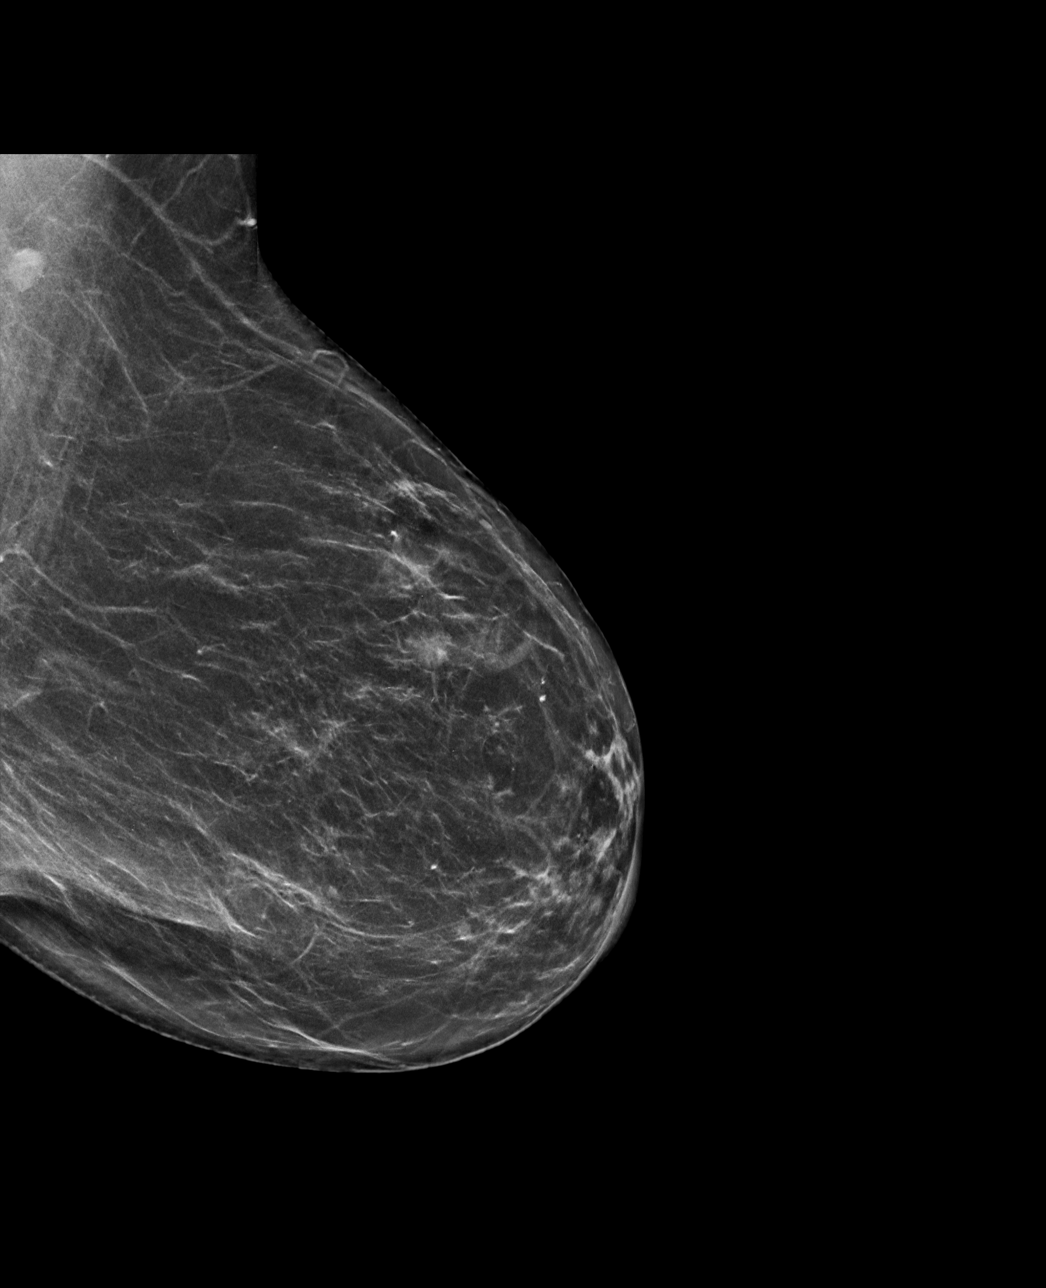

[L MLO tomo · tomo slice 35/69.0]
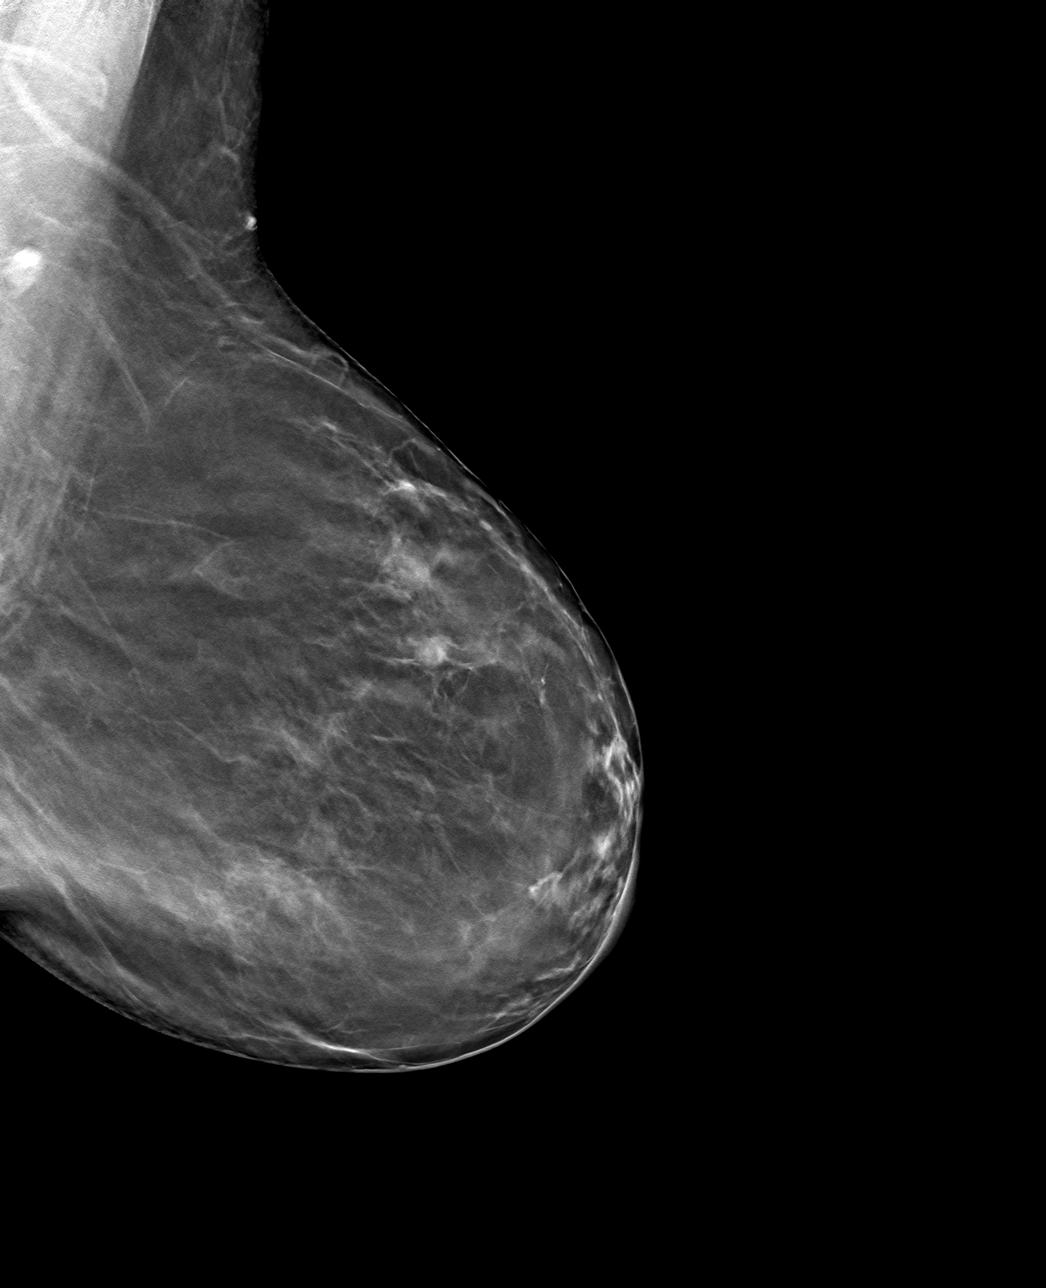

[L CC tomo · tomo slice 31/61.0]
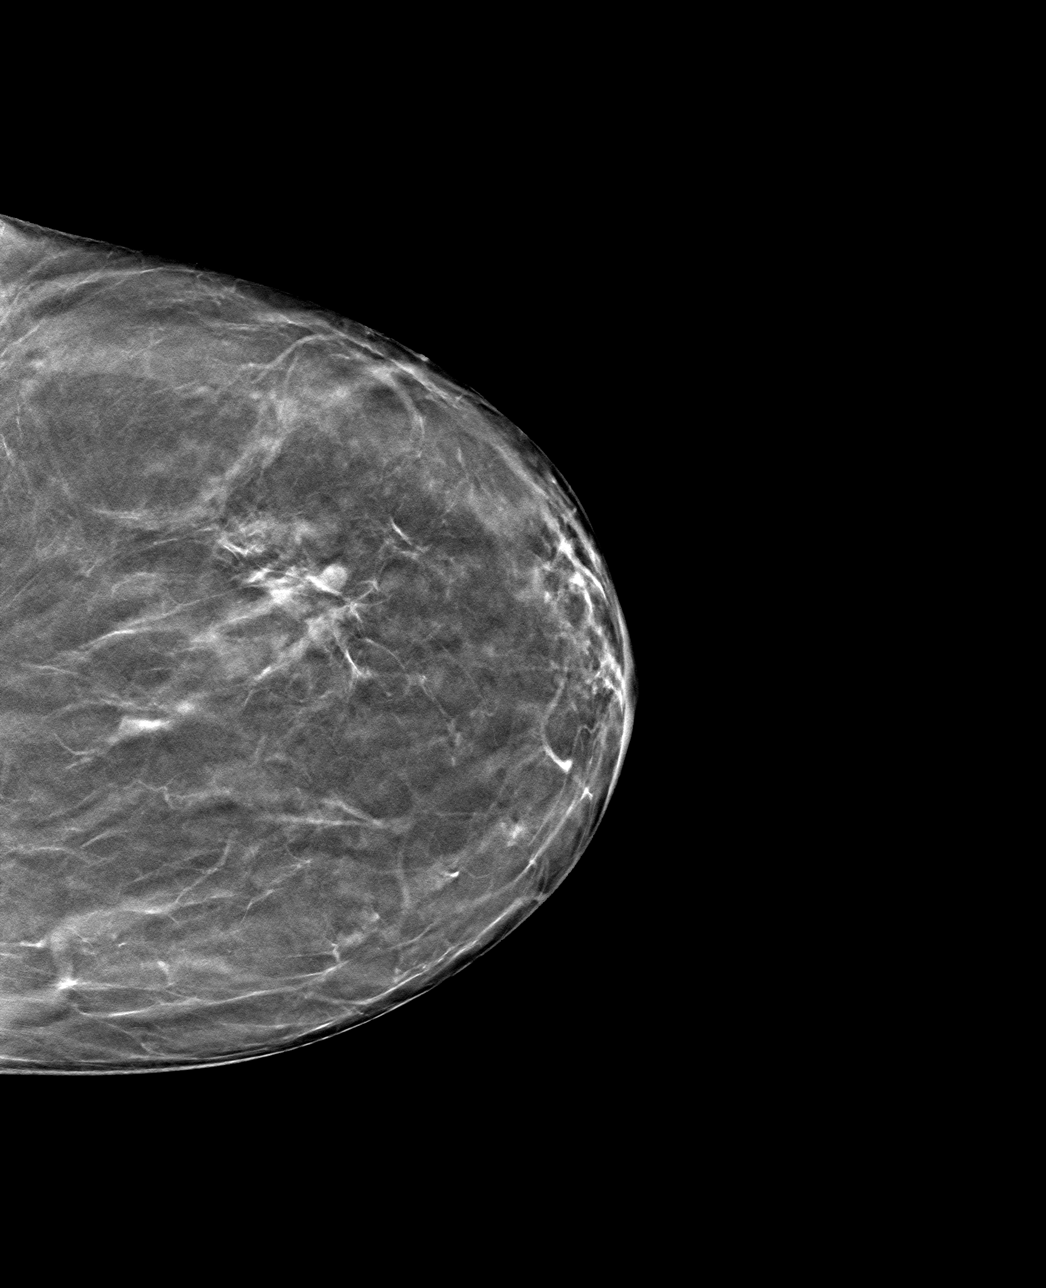

[4 of 12 positions shown; findings below may reference images not displayed]

ACR Breast Density Category b: There are scattered areas of
fibroglandular density.
FINDINGS: In the left axilla, a possible mass warrants further evaluation.
Patient is status post right mastectomy.
IMPRESSION: Further evaluation is suggested for possible mass in the left
axilla.

RECOMMENDATION:
Ultrasound of the left axilla. (Code:[id number])

The patient will be contacted regarding the findings, and additional
imaging will be scheduled.

BI-RADS CATEGORY  0: Incomplete. Need additional imaging evaluation
and/or prior mammograms for comparison.

## 2022-11-22 ENCOUNTER — Ambulatory Visit (HOSPITAL_COMMUNITY)
Admission: RE | Admit: 2022-11-22 | Discharge: 2022-11-22 | Disposition: A | Discharge status: Home / Self Care | Payer: Medicare Other | Source: Ambulatory Visit | Attending: Hematology and Oncology | Admitting: Hematology and Oncology

## 2022-11-22 DIAGNOSIS — C50911 Malignant neoplasm of unspecified site of right female breast: Secondary | ICD-10-CM

## 2022-11-22 DIAGNOSIS — J90 Pleural effusion, not elsewhere classified: Secondary | ICD-10-CM | POA: Diagnosis not present

## 2022-11-22 DIAGNOSIS — C7800 Secondary malignant neoplasm of unspecified lung: Secondary | ICD-10-CM | POA: Insufficient documentation

## 2022-11-22 DIAGNOSIS — C50919 Malignant neoplasm of unspecified site of unspecified female breast: Secondary | ICD-10-CM | POA: Diagnosis not present

## 2022-11-22 MED ORDER — IOHEXOL 300 MG/ML  SOLN
75.0000 mL | Freq: Once | INTRAMUSCULAR | Status: AC | PRN
  Administered 2022-11-22: 75 mL via INTRAVENOUS

## 2022-11-28 MED FILL — Fosaprepitant Dimeglumine For IV Infusion 150 MG (Base Eq): INTRAVENOUS | Qty: 5 | Status: AC

## 2022-11-28 MED FILL — Dexamethasone Sodium Phosphate Inj 100 MG/10ML: INTRAMUSCULAR | Qty: 1 | Status: AC

## 2022-11-29 ENCOUNTER — Encounter: Payer: Self-pay | Admitting: Adult Health

## 2022-11-29 ENCOUNTER — Inpatient Hospital Stay: Payer: Medicare Other

## 2022-11-29 ENCOUNTER — Inpatient Hospital Stay: Payer: Medicare Other | Attending: Oncology

## 2022-11-29 ENCOUNTER — Inpatient Hospital Stay (HOSPITAL_BASED_OUTPATIENT_CLINIC_OR_DEPARTMENT_OTHER): Payer: Medicare Other | Admitting: Adult Health

## 2022-11-29 VITALS — BP 90/69 | HR 92 | Temp 97.4°F | Resp 18 | Ht 63.5 in | Wt 141.7 lb

## 2022-11-29 DIAGNOSIS — C50911 Malignant neoplasm of unspecified site of right female breast: Secondary | ICD-10-CM | POA: Diagnosis not present

## 2022-11-29 DIAGNOSIS — Z79899 Other long term (current) drug therapy: Secondary | ICD-10-CM | POA: Insufficient documentation

## 2022-11-29 DIAGNOSIS — C7800 Secondary malignant neoplasm of unspecified lung: Secondary | ICD-10-CM | POA: Insufficient documentation

## 2022-11-29 DIAGNOSIS — Z5112 Encounter for antineoplastic immunotherapy: Secondary | ICD-10-CM | POA: Insufficient documentation

## 2022-11-29 DIAGNOSIS — Z95828 Presence of other vascular implants and grafts: Secondary | ICD-10-CM

## 2022-11-29 DIAGNOSIS — C787 Secondary malignant neoplasm of liver and intrahepatic bile duct: Secondary | ICD-10-CM

## 2022-11-29 DIAGNOSIS — Z170 Estrogen receptor positive status [ER+]: Secondary | ICD-10-CM | POA: Diagnosis not present

## 2022-11-29 DIAGNOSIS — C7951 Secondary malignant neoplasm of bone: Secondary | ICD-10-CM

## 2022-11-29 DIAGNOSIS — C50411 Malignant neoplasm of upper-outer quadrant of right female breast: Secondary | ICD-10-CM | POA: Diagnosis not present

## 2022-11-29 LAB — CBC WITH DIFFERENTIAL (CANCER CENTER ONLY)
Abs Immature Granulocytes: 0.05 10*3/uL (ref 0.00–0.07)
Basophils Absolute: 0.1 10*3/uL (ref 0.0–0.1)
Basophils Relative: 1 %
Eosinophils Absolute: 0.2 10*3/uL (ref 0.0–0.5)
Eosinophils Relative: 2 %
HCT: 39.2 % (ref 36.0–46.0)
Hemoglobin: 12.7 g/dL (ref 12.0–15.0)
Immature Granulocytes: 1 %
Lymphocytes Relative: 9 %
Lymphs Abs: 0.7 10*3/uL (ref 0.7–4.0)
MCH: 32.2 pg (ref 26.0–34.0)
MCHC: 32.4 g/dL (ref 30.0–36.0)
MCV: 99.2 fL (ref 80.0–100.0)
Monocytes Absolute: 1.2 10*3/uL — ABNORMAL HIGH (ref 0.1–1.0)
Monocytes Relative: 15 %
Neutro Abs: 5.8 10*3/uL (ref 1.7–7.7)
Neutrophils Relative %: 72 %
Platelet Count: 354 10*3/uL (ref 150–400)
RBC: 3.95 MIL/uL (ref 3.87–5.11)
RDW: 15 % (ref 11.5–15.5)
WBC Count: 8 10*3/uL (ref 4.0–10.5)
nRBC: 0 % (ref 0.0–0.2)

## 2022-11-29 LAB — CMP (CANCER CENTER ONLY)
ALT: 6 U/L (ref 0–44)
AST: 13 U/L — ABNORMAL LOW (ref 15–41)
Albumin: 4.3 g/dL (ref 3.5–5.0)
Alkaline Phosphatase: 100 U/L (ref 38–126)
Anion gap: 8 (ref 5–15)
BUN: 23 mg/dL (ref 8–23)
CO2: 22 mmol/L (ref 22–32)
Calcium: 9.6 mg/dL (ref 8.9–10.3)
Chloride: 108 mmol/L (ref 98–111)
Creatinine: 0.94 mg/dL (ref 0.44–1.00)
GFR, Estimated: >60 mL/min (ref >60)
Glucose, Bld: 104 mg/dL — ABNORMAL HIGH (ref 70–99)
Potassium: 4.4 mmol/L (ref 3.5–5.1)
Sodium: 138 mmol/L (ref 135–145)
Total Bilirubin: 0.3 mg/dL (ref 0.3–1.2)
Total Protein: 7.5 g/dL (ref 6.5–8.1)

## 2022-11-29 MED ORDER — SODIUM CHLORIDE 0.9 % IV SOLN
10.0000 mg | Freq: Once | INTRAVENOUS | Status: AC
  Administered 2022-11-29: 10 mg via INTRAVENOUS
  Filled 2022-11-29: qty 10

## 2022-11-29 MED ORDER — SODIUM CHLORIDE 0.9 % IV SOLN
150.0000 mg | Freq: Once | INTRAVENOUS | Status: AC
  Administered 2022-11-29: 150 mg via INTRAVENOUS
  Filled 2022-11-29: qty 150

## 2022-11-29 MED ORDER — PALONOSETRON HCL INJECTION 0.25 MG/5ML
0.2500 mg | Freq: Once | INTRAVENOUS | Status: AC
  Administered 2022-11-29: 0.25 mg via INTRAVENOUS
  Filled 2022-11-29: qty 5

## 2022-11-29 MED ORDER — SODIUM CHLORIDE 0.9% FLUSH
10.0000 mL | Freq: Once | INTRAVENOUS | Status: AC
  Administered 2022-11-29: 10 mL

## 2022-11-29 MED ORDER — HEPARIN SOD (PORK) LOCK FLUSH 100 UNIT/ML IV SOLN
500.0000 [IU] | Freq: Once | INTRAVENOUS | Status: AC | PRN
  Administered 2022-11-29: 500 [IU]

## 2022-11-29 MED ORDER — ACETAMINOPHEN 325 MG PO TABS
650.0000 mg | ORAL_TABLET | Freq: Once | ORAL | Status: AC
  Administered 2022-11-29: 650 mg via ORAL
  Filled 2022-11-29: qty 2

## 2022-11-29 MED ORDER — DIPHENHYDRAMINE HCL 25 MG PO CAPS
50.0000 mg | ORAL_CAPSULE | Freq: Once | ORAL | Status: AC
  Administered 2022-11-29: 50 mg via ORAL
  Filled 2022-11-29: qty 2

## 2022-11-29 MED ORDER — FAM-TRASTUZUMAB DERUXTECAN-NXKI CHEMO 100 MG IV SOLR
3.2000 mg/kg | Freq: Once | INTRAVENOUS | Status: AC
  Administered 2022-11-29: 200 mg via INTRAVENOUS
  Filled 2022-11-29: qty 10

## 2022-11-29 MED ORDER — SODIUM CHLORIDE 0.9% FLUSH
10.0000 mL | INTRAVENOUS | Status: DC | PRN
  Administered 2022-11-29: 10 mL

## 2022-11-29 MED ORDER — DEXTROSE 5 % IV SOLN: Freq: Once | INTRAVENOUS | Status: AC

## 2022-11-29 NOTE — Progress Notes (Signed)
[facility] Cancer Follow up:    [name], FNP [address] [place] [address]   DIAGNOSIS:  Cancer Staging  Carcinoma of right breast metastatic to lung [facility]) Staging form: Breast, AJCC 7th Edition - Clinical: Stage IV (TX, NX, M1) - Signed by Barbara December, MD on 09/09/2013 Laterality: Right Biopsy of metastatic site performed: No - Pathologic: No stage assigned - Unsigned Laterality: Right  Malignant neoplasm of upper-outer quadrant of right breast in female, estrogen receptor positive (HCC) Staging form: Breast, AJCC 7th Edition - Clinical: Stage IV (TX, NX, M1) - Signed by Barbara Dell, MD on 08/13/2014   SUMMARY OF ONCOLOGIC HISTORY: Oncology History  Carcinoma of right breast metastatic to lung (HCC)  10/10/2011 Initial Diagnosis   Carcinoma of right breast metastatic to lung (HCC)   05/02/2022 -  Chemotherapy   Patient is on Treatment Plan : BREAST METASTATIC Fam-Trastuzumab Deruxtecan-nxki (Enhertu) (5.4) q21d     Malignant neoplasm of upper-outer quadrant of right breast in female, estrogen receptor positive (HCC)  03/22/2004 Initial Diagnosis   right mastectomy and sentinel lymph node sampling 03/22/2004 for a right upper-outer quadrant pT2 pN1, stage IIB invasive ductal carcinoma with lobular features, grade 2, estrogen receptor and progesterone receptor positive, HER-2 negative, with an MIB-1 of 9%   05/2004 - 09/18/2004 Chemotherapy   adjuvant chemotherapy with dose dense doxorubicin and cyclophosphamide x4 cycles (first cycle delayed one week) followed by dose dense paclitaxel x4 was completed 09/18/2004   09/2004 - 2009 Anti-estrogen oral therapy   tamoxifen started March 2006, discontinued 2009   08/2011 Relapse/Recurrence   Metastatic disease:large pericardial effusion, large right pleural effusion and possible right middle lobe bronchial obstruction  status post pericardial window placement, fiberoptic bronchoscopy and  right Pleurx placement 09/11/2011, with biopsy of the bronchus intermedius and pericardium positive for metastatic breast cancer, ER 91%, PR 100% p with an MIB-1 of 35%, and no HER-2 amplification, ratio being 1.37   10/10/2011 - 07/2019 Anti-estrogen oral therapy   BOLERO-4 trial 10/10/2011, receiving letrozole and everolimus (stopped for progression)   08/16/2019 Miscellaneous   foundation 1 requested on liver biopsy from 08/16/2019 shows a stable microsatellite status with 1 mutation/MB, amplification of C11orf30, FGF10 and RICTOR, with mutations in ESR1 and PTEN   08/16/2019 Miscellaneous   Liver biopsy: Metastatic carcinoma to the liver consistent with breast primary, ER 100%, PR 100%, Ki-67 5%, HER2 2+ by IHC, FISH HER2 negative   08/24/2019 -  Anti-estrogen oral therapy   fulvestrant started 08/24/2019, repeated every 4 weeks Palbociclib 125 mg daily 21 days on 7 days off started 08/25/2019 Switched to ribociclib s starting 08/22/2020   02/28/2021 -  Radiation Therapy   palliative radiation (Dr. Langston Masker in [place]) 02/28/2021 to right hip area   10/27/2021 -  Anti-estrogen oral therapy   Abemaciclib with fulvestrant   01/14/2022 Miscellaneous   Guardant360: ESR 1 mutation (no other mutations) MSI high was not detected, PTEN, T p53, EGFR amplification, RB1 mutations detected without any approved treatment options     CURRENT THERAPY: Enhertu  INTERVAL HISTORY: Barbara Parks 72 y.o. female returns for follow-up of her metastatic breast cancer currently on treatment with Enhertu.  Her most recent restaging CT occurred on November 26, 2022 demonstrating no significant interval change.  She is tolerating Enhertu relatively well.  She has fatigue, worse the first few days after treatment along with diarrhea she manages with imodium.  She is eating and drinking well and  otherwise is doing well.    Most recent echo occurred 10/30/2022 demonstrating EF decline to 50%.  This was reviewed by Dr.  Wyline Mood who had asked her staff to reach out and review the decline with patient and need to start Carvedilol 3.125 mg BID.  Barbara Parks tells me she was unaware of the decline and has not started any Carvedilol.   Patient Active Problem List   Diagnosis Date Noted   Port-A-Cath in place 06/13/2022   Malignant neoplasm metastatic to liver (HCC) 08/09/2019   Malignant neoplasm metastatic to bone (HCC) 12/23/2018   Goals of care, counseling/discussion 12/23/2018   Hyperglycemia 10/02/2017   Hepatic steatosis 01/23/2017   OA (osteoarthritis) of hip 06/07/2015   Elevated cholesterol with high triglycerides 03/22/2015   Malignant neoplasm of upper-outer quadrant of right breast in female, estrogen receptor positive (HCC) 08/13/2014   Thoracic aorta atherosclerosis (HCC) 08/13/2014   Proteinuria 06/16/2014   Hypercalcemia 06/16/2014   Fatigue 04/21/2014   ADHD (attention deficit hyperactivity disorder)    Osteopenia due to cancer therapy 09/09/2013   Carcinoma of right breast metastatic to lung (HCC) 10/10/2011   Malignant pericardial effusion 09/10/2011   Pleural effusion 09/10/2011   Pneumonia 08/26/2011   Asthma in adult without complication 08/20/2010   Osteoarthritis of left hip 08/17/2010    is allergic to aspirin, latex, and nsaids.  MEDICAL HISTORY: Past Medical History:  Diagnosis Date   Arthritis    left hip   Asthma    breast ca 2005   breast/chemo R mastectomy   Breast cancer (HCC)    Dermatitis    Diabetes mellitus without complication (HCC)    GERD (gastroesophageal reflux disease)    Hypercholesteremia    Metastasis to lung (HCC) dx'd 08/2011   Osteopenia due to cancer therapy 09/09/2013   Palpitations    Personal history of chemotherapy    Shortness of breath     SURGICAL HISTORY: Past Surgical History:  Procedure Laterality Date   BREAST SURGERY  2005   right   CATARACT EXTRACTION W/PHACO Left 01/18/2014   Procedure: CATARACT EXTRACTION PHACO AND INTRAOCULAR  LENS PLACEMENT (IOC);  Surgeon: Loraine Leriche T. Nile Riggs, MD;  Location: [facility];  Service: Ophthalmology;  Laterality: Left;  CDE 15.79   CATARACT EXTRACTION W/PHACO Right 02/08/2014   Procedure: CATARACT EXTRACTION PHACO AND INTRAOCULAR LENS PLACEMENT (IOC);  Surgeon: Loraine Leriche T. Nile Riggs, MD;  Location: [facility];  Service: Ophthalmology;  Laterality: Right;  CDE 4.41   CHEST TUBE INSERTION  09/11/2011   Procedure: INSERTION PLEURAL DRAINAGE CATHETER;  Surgeon: Alleen Borne, MD;  Location: [facility];  Service: Thoracic;  Laterality: Right;   IR IMAGING GUIDED PORT INSERTION  04/23/2022   MASTECTOMY Right    PERICARDIAL WINDOW  09/11/2011   Procedure: PERICARDIAL WINDOW;  Surgeon: Alleen Borne, MD;  Location: [facility];  Service: Thoracic;  Laterality: N/A;   REMOVAL OF PLEURAL DRAINAGE CATHETER  12/19/2011   Procedure: REMOVAL OF PLEURAL DRAINAGE CATHETER;  Surgeon: Alleen Borne, MD;  Location: [facility];  Service: Thoracic;  Laterality: Right;  TO BE DONE IN MINOR ROOM, SHORT STAY   SPINE SURGERY  1996   TOTAL HIP ARTHROPLASTY Left 06/07/2015   Procedure: LEFT TOTAL HIP ARTHROPLASTY ANTERIOR APPROACH;  Surgeon: Ollen Gross, MD;  Location: [facility];  Service: Orthopedics;  Laterality: Left;   VIDEO BRONCHOSCOPY  09/11/2011   Procedure: VIDEO BRONCHOSCOPY;  Surgeon: Alleen Borne, MD;  Location: [facility] OR;  Service: Thoracic;  Laterality: N/A;  SOCIAL HISTORY: Social History   Socioeconomic History   Marital status: Single    Spouse name: Not on file   Number of children: Not on file   Years of education: Not on file   Highest education level: Not on file  Occupational History   Not on file  Tobacco Use   Smoking status: Former    Packs/day: 1.50    Years: 30.00    Additional pack years: 0.00    Total pack years: 45.00    Types: Cigarettes    Quit date: 09/10/2007    Years since quitting: 15.2   Smokeless tobacco: Never  Substance and Sexual Activity   Alcohol use: No   Drug use: No   Sexual activity: Not  Currently  Other Topics Concern   Not on file  Social History Narrative   Not on file   Social Determinants of Health   Financial Resource Strain: Not on file  Food Insecurity: Not on file  Transportation Needs: Not on file  Physical Activity: Not on file  Stress: Not on file  Social Connections: Not on file  Intimate Partner Violence: Not on file    FAMILY HISTORY: Family History  Problem Relation Age of Onset   Cancer Mother        breast   Heart disease Father    Diabetes Other    Anesthesia problems Neg Hx     Review of Systems  Constitutional:  Positive for fatigue. Negative for appetite change, chills, fever and unexpected weight change.  HENT:   Negative for hearing loss, lump/mass and trouble swallowing.   Eyes:  Negative for eye problems and icterus.  Respiratory:  Negative for chest tightness, cough and shortness of breath.   Cardiovascular:  Negative for chest pain, leg swelling and palpitations.  Gastrointestinal:  Positive for diarrhea. Negative for abdominal distention, abdominal pain, constipation, nausea and vomiting.  Endocrine: Negative for hot flashes.  Genitourinary:  Negative for difficulty urinating.   Musculoskeletal:  Negative for arthralgias.  Skin:  Negative for itching and rash.  Neurological:  Negative for dizziness, extremity weakness, headaches and numbness.  Hematological:  Negative for adenopathy. Does not bruise/bleed easily.  Psychiatric/Behavioral:  Negative for depression. The patient is not nervous/anxious.       PHYSICAL EXAMINATION   Onc Performance Status - 11/29/22 1020       ECOG Perf Status   ECOG Perf Status Fully active, able to carry on all pre-disease performance without restriction      KPS SCALE   KPS % SCORE Able to carry on normal activity, minor s/s of disease             Vitals:   11/29/22 1019  BP: 90/69  Pulse: 92  Resp: 18  Temp: (!) 97.4 F (36.3 C)  SpO2: 98%   BP left arm manually is  108/58 Physical Exam Constitutional:      General: She is not in acute distress.    Appearance: Normal appearance. She is not toxic-appearing.  HENT:     Head: Normocephalic and atraumatic.  Eyes:     General: No scleral icterus. Cardiovascular:     Rate and Rhythm: Normal rate and regular rhythm.     Pulses: Normal pulses.     Heart sounds: Normal heart sounds.  Pulmonary:     Effort: Pulmonary effort is normal.     Breath sounds: Normal breath sounds.  Abdominal:     General: Abdomen is flat. Bowel sounds are  normal. There is no distension.     Palpations: Abdomen is soft.     Tenderness: There is no abdominal tenderness.  Musculoskeletal:        General: No swelling.     Cervical back: Neck supple.  Lymphadenopathy:     Cervical: No cervical adenopathy.  Skin:    General: Skin is warm and dry.     Findings: No rash.  Neurological:     General: No focal deficit present.     Mental Status: She is alert.  Psychiatric:        Mood and Affect: Mood normal.        Behavior: Behavior normal.     LABORATORY DATA:  CBC    Component Value Date/Time   WBC 8.0 11/29/2022 0951   WBC 10.0 02/26/2022 1123   RBC 3.95 11/29/2022 0951   HGB 12.7 11/29/2022 0951   HGB 14.1 07/08/2017 0942   HCT 39.2 11/29/2022 0951   HCT 42.9 07/08/2017 0942   PLT 354 11/29/2022 0951   PLT 283 07/08/2017 0942   MCV 99.2 11/29/2022 0951   MCV 81.1 07/08/2017 0942   MCH 32.2 11/29/2022 0951   MCHC 32.4 11/29/2022 0951   RDW 15.0 11/29/2022 0951   RDW 14.6 (H) 07/08/2017 0942   LYMPHSABS 0.7 11/29/2022 0951   LYMPHSABS 1.2 07/08/2017 0942   MONOABS 1.2 (H) 11/29/2022 0951   MONOABS 0.7 07/08/2017 0942   EOSABS 0.2 11/29/2022 0951   EOSABS 0.1 07/08/2017 0942   BASOSABS 0.1 11/29/2022 0951   BASOSABS 0.0 07/08/2017 0942    CMP     Component Value Date/Time   NA 138 11/29/2022 0951   NA 139 07/08/2017 0942   K 4.4 11/29/2022 0951   K 3.8 07/08/2017 0942   CL 108 11/29/2022 0951    CL 109 (H) 12/31/2012 0940   CO2 22 11/29/2022 0951   CO2 18 (L) 07/08/2017 0942   GLUCOSE 104 (H) 11/29/2022 0951   GLUCOSE 156 (H) 07/08/2017 0942   GLUCOSE 127 (H) 12/31/2012 0940   BUN 23 11/29/2022 0951   BUN 17.7 07/08/2017 0942   CREATININE 0.94 11/29/2022 0951   CREATININE 1.0 07/08/2017 0942   CALCIUM 9.6 11/29/2022 0951   CALCIUM 10.4 07/08/2017 0942   PROT 7.5 11/29/2022 0951   PROT 7.7 07/08/2017 0942   ALBUMIN 4.3 11/29/2022 0951   ALBUMIN 3.4 (L) 07/08/2017 0942   AST 13 (L) 11/29/2022 0951   AST 54 (H) 07/08/2017 0942   ALT 6 11/29/2022 0951   ALT 39 07/08/2017 0942   ALKPHOS 100 11/29/2022 0951   ALKPHOS 232 (H) 07/08/2017 0942   BILITOT 0.3 11/29/2022 0951   BILITOT 0.42 07/08/2017 0942   GFRNONAA >60 11/29/2022 0951   GFRAA 49 (L) 04/05/2020 1504   GFRAA 54 (L) 12/25/2017 0959       ASSESSMENT and THERAPY PLAN:   Malignant neoplasm of upper-outer quadrant of right breast in female, estrogen receptor positive (HCC) Barbara Parks is a 72 year old woman with stage IV breast cancer currently on treatment with Enhertu.  Her most recent restaging CT occurred November 26, 2022 demonstrating no significant interval change.  Her most recent echocardiogram occurred April 4 demonstrating left ventricular ejection fraction of 45 to 50%.  Stage IV breast cancer: She will continue on Enhertu every 3 weeks.  Restaging will occur in about 3 months with CT chest and MR liver. Her most recent CT scan of the abdomen could not identify the liver lesions as well  as the MRI of the liver therefore when she undergoes restaging in 3 months we will repeat MRI liver to ensure and verify the cancer is stable or improving.  Fatigue: She is managing this with energy conservation which she will continue. Diarrhea: Managed well with Imodium.  She will continue this. Decline in her ejection fraction: She has not started carvedilol as suggested by Dr. Wyline Mood.  She was unaware of her echo results  until I reviewed them with her.  I reached out to Dr. Wyline Mood with Abriel's vital signs today to see if she wants me to prescribe the carvedilol.  I reviewed with Elouise how the carvedilol works and she is willing to try it.  We did discuss that she would need to have a blood pressure monitor and check her blood pressure daily while taking the medication since her blood pressure is on the low side of normal.  RTC in 3 weeks for labs, f/u with Dr. [name], [facility]    All questions were answered. The patient knows to call the clinic with any problems, questions or concerns. We can certainly see the patient much sooner if necessary.  Total encounter time:30 minutes*in face-to-face visit time, chart review, lab review, care coordination, order entry, and documentation of the encounter time.    Lillard Anes, NP 11/29/22 11:08 AM Medical Oncology and Hematology [facility] [address] [place], [place] [address] Tel. [phone]    Fax. [phone]  *Total Encounter Time as defined by the Centers for Medicare and Medicaid Services includes, in addition to the face-to-face time of a patient visit (documented in the note above) non-face-to-face time: obtaining and reviewing outside history, ordering and reviewing medications, tests or procedures, care coordination (communications with other health care professionals or caregivers) and documentation in the medical record.

## 2022-11-29 NOTE — Patient Instructions (Signed)
[facility]  Discharge Instructions: Thank you for choosing [facility] to provide your oncology and hematology care.   If you have a lab appointment with the Cancer Center, please go directly to the Cancer Center and check in at the registration area.   Wear comfortable clothing and clothing appropriate for easy access to any Portacath or PICC line.   We strive to give you quality time with your provider. You may need to reschedule your appointment if you arrive late (15 or more minutes).  Arriving late affects you and other patients whose appointments are after yours.  Also, if you miss three or more appointments without notifying the office, you may be dismissed from the clinic at the provider's discretion.      For prescription refill requests, have your pharmacy contact our office and allow 72 hours for refills to be completed.    Today you received the following chemotherapy and/or immunotherapy agents: fam-trastuzumab dertuxtecan-nxki      To help prevent nausea and vomiting after your treatment, we encourage you to take your nausea medication as directed.  BELOW ARE SYMPTOMS THAT SHOULD BE REPORTED IMMEDIATELY: *FEVER GREATER THAN 100.4 F (38 C) OR HIGHER *CHILLS OR SWEATING *NAUSEA AND VOMITING THAT IS NOT CONTROLLED WITH YOUR NAUSEA MEDICATION *UNUSUAL SHORTNESS OF BREATH *UNUSUAL BRUISING OR BLEEDING *URINARY PROBLEMS (pain or burning when urinating, or frequent urination) *BOWEL PROBLEMS (unusual diarrhea, constipation, pain near the anus) TENDERNESS IN MOUTH AND THROAT WITH OR WITHOUT PRESENCE OF ULCERS (sore throat, sores in mouth, or a toothache) UNUSUAL RASH, SWELLING OR PAIN  UNUSUAL VAGINAL DISCHARGE OR ITCHING   Items with * indicate a potential emergency and should be followed up as soon as possible or go to the Emergency Department if any problems should occur.  Please show the CHEMOTHERAPY ALERT CARD or  IMMUNOTHERAPY ALERT CARD at check-in to the Emergency Department and triage nurse.  Should you have questions after your visit or need to cancel or reschedule your appointment, please contact [facility]  Dept: [phone]  and follow the prompts.  Office hours are 8:00 a.m. to 4:30 p.m. Monday - Friday. Please note that voicemails left after 4:00 p.m. may not be returned until the following business day.  We are closed weekends and major holidays. You have access to a nurse at all times for urgent questions. Please call the main number to the clinic Dept: [phone] and follow the prompts.   For any non-urgent questions, you may also contact your provider using MyChart. We now offer e-Visits for anyone 67 and older to request care online for non-urgent symptoms. For details visit [email or web address].   Also download the MyChart app! Go to the app store, search "MyChart", open the app, select [facility], and log in with your MyChart username and password.

## 2022-11-29 NOTE — Assessment & Plan Note (Addendum)
Barbara Parks is a 72 year old woman with stage IV breast cancer currently on treatment with Enhertu.  Her most recent restaging CT occurred November 26, 2022 demonstrating no significant interval change.  Her most recent echocardiogram occurred April 4 demonstrating left ventricular ejection fraction of 45 to 50%.  Stage IV breast cancer: She will continue on Enhertu every 3 weeks.  Restaging will occur in about 3 months with CT chest and MR liver. Her most recent CT scan of the abdomen could not identify the liver lesions as well as the MRI of the liver therefore when she undergoes restaging in 3 months we will repeat MRI liver to ensure and verify the cancer is stable or improving.  Fatigue: She is managing this with energy conservation which she will continue. Diarrhea: Managed well with Imodium.  She will continue this. Decline in her ejection fraction: She has not started carvedilol as suggested by Dr. Wyline Mood.  She was unaware of her echo results until I reviewed them with her.  I reached out to Dr. Wyline Mood with Barbara Parks's vital signs today to see if she wants me to prescribe the carvedilol.  I reviewed with Barbara Parks how the carvedilol works and she is willing to try it.  We did discuss that she would need to have a blood pressure monitor and check her blood pressure daily while taking the medication since her blood pressure is on the low side of normal.  RTC in 3 weeks for labs, f/u with Dr. [name], [facility]

## 2022-11-30 IMAGING — US US AXILLARY LEFT
1 series · 4 of 4 positions shown · non-contrast
Comparison: Previous exams including recent screening mammogram
dated 05/07/2021.

CLINICAL DATA: Patient returns today to evaluate a possible mass
within the LEFT axilla. History of RIGHT breast cancer status post
mastectomy.

EXAM:
ULTRASOUND OF THE LEFT AXILLA

[Series 1: us axillary left · 0.07mm/px · 4 of 4 slices shown]
[im 1/4]
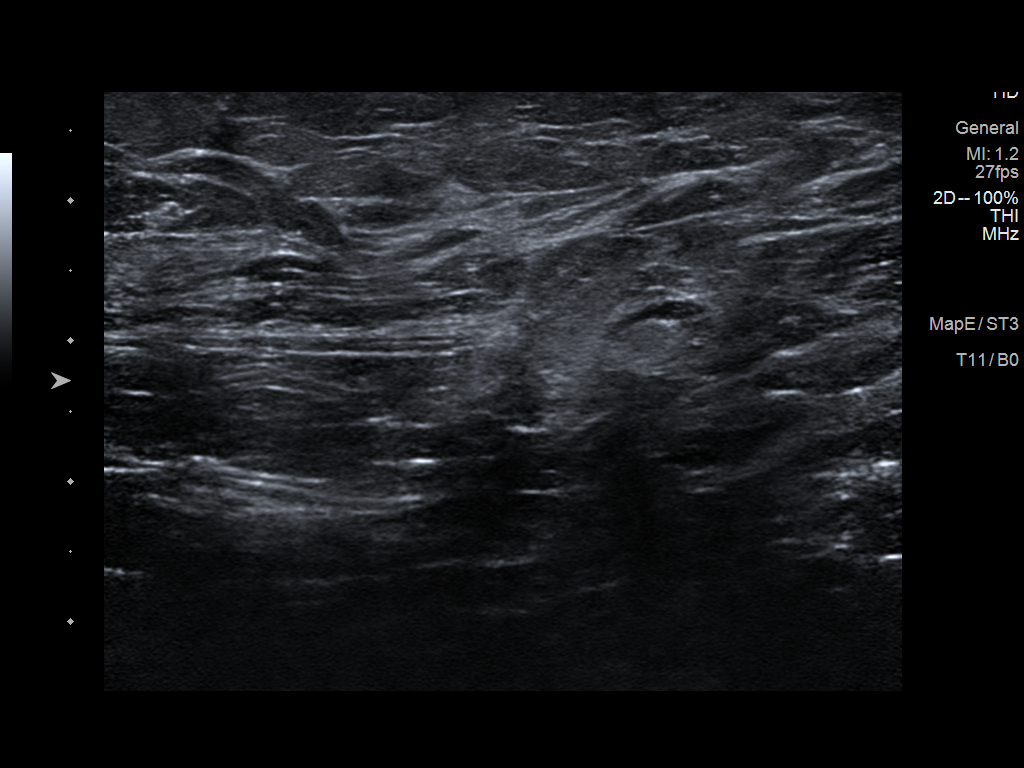
[im 2/4]
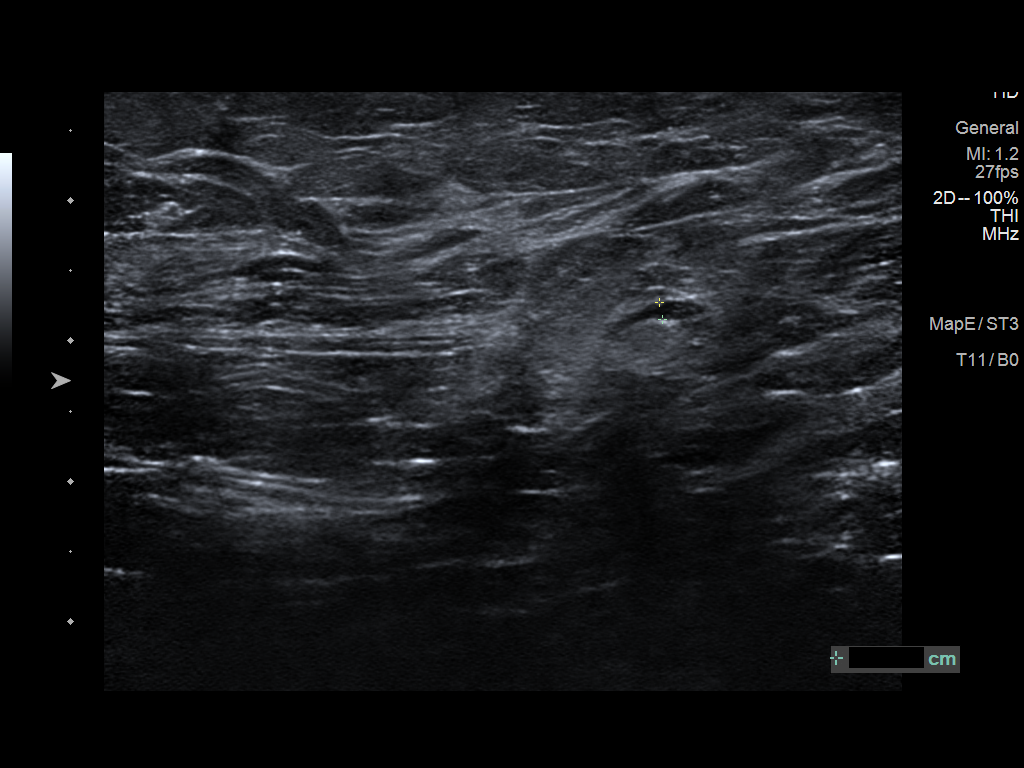
[im 3/4]
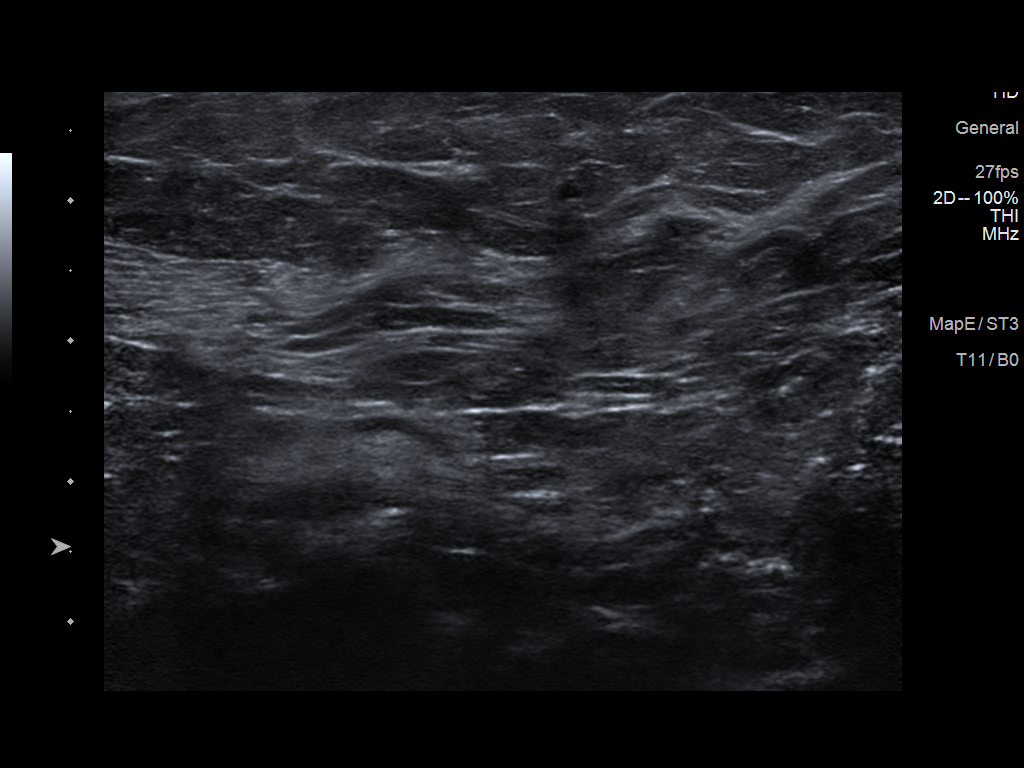
[im 4/4]
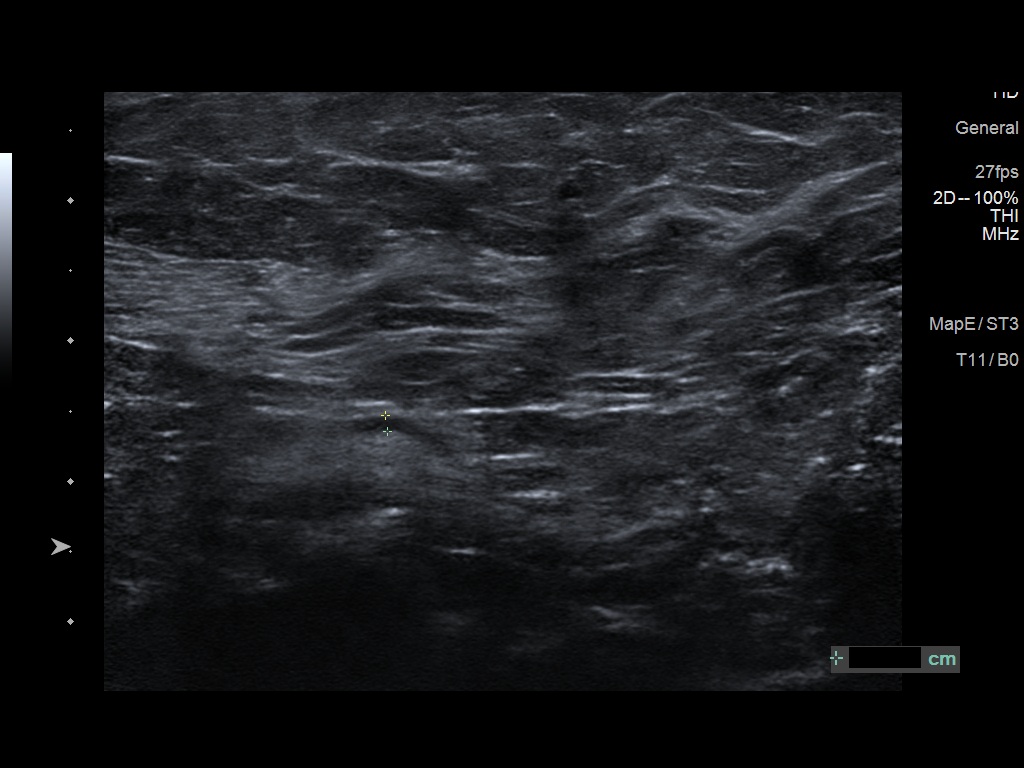

[4 of 4 positions shown; findings below may reference images not displayed]

FINDINGS: Ultrasound is performed, evaluating the LEFT axilla, showing several
normal-appearing lymph nodes, 1 of which corresponds to the recent
mammographic finding. There are no enlarged or morphologically
abnormal lymph nodes identified by ultrasound in the LEFT axilla.
IMPRESSION: No evidence of malignancy. Benign lymph node in the LEFT axilla,
corresponding to the mammographic finding.

RECOMMENDATION:
Screening mammogram in one year.(Code:[id number])

I have discussed the findings and recommendations with the patient.
If applicable, a reminder letter will be sent to the patient
regarding the next appointment.

BI-RADS CATEGORY  2: Benign.

## 2022-12-03 ENCOUNTER — Telehealth: Payer: Self-pay

## 2022-12-03 NOTE — Telephone Encounter (Signed)
Attempted to call pt to discuss carvedilol and TTE per NP. No answer. LVM for call back.

## 2022-12-03 NOTE — Telephone Encounter (Signed)
-----   Message from Loa Socks, NP sent at 12/02/2022  7:55 AM EDT ----- Regarding: FW: Please help me not forget the below. ----- Message ----- From: Maisie Fus, MD Sent: 12/02/2022   7:43 AM EDT To: Loa Socks, NP  Kirstie Mirza! Thank you for letting me know. My nurse Eileen Stanford tried a few times and unfortunately couldn't reach her. That would be great if you could send it in. Bps are borderline, can try. If her blood pressure drops below SBP 100, then would stop it.  If you wouldn't mind, please also order a limited TTE for EF and GLS in July 2024. ----- Message ----- From: Loa Socks, NP Sent: 11/29/2022   4:23 PM EDT To: Maisie Fus, MD  The patient's last echo occurred on 10/31/2022 and was treated and an ejection fraction of 45 to 50%.  In your results note you recommended carvedilol 3.125 mg p.o. twice daily.  She never received the results or this prescription.  She has not started it.  I reviewed the results with her and the reason behind the carvedilol and she is willing to take it.  I am happy to send in if you are still good with it?  She has a blood pressure of 108/58 and a heart rate of 92.

## 2022-12-09 DIAGNOSIS — N1831 Chronic kidney disease, stage 3a: Secondary | ICD-10-CM | POA: Diagnosis not present

## 2022-12-09 DIAGNOSIS — E1165 Type 2 diabetes mellitus with hyperglycemia: Secondary | ICD-10-CM | POA: Diagnosis not present

## 2022-12-09 DIAGNOSIS — R931 Abnormal findings on diagnostic imaging of heart and coronary circulation: Secondary | ICD-10-CM | POA: Diagnosis not present

## 2022-12-09 DIAGNOSIS — E785 Hyperlipidemia, unspecified: Secondary | ICD-10-CM | POA: Diagnosis not present

## 2022-12-09 DIAGNOSIS — C50911 Malignant neoplasm of unspecified site of right female breast: Secondary | ICD-10-CM | POA: Diagnosis not present

## 2022-12-09 DIAGNOSIS — C50411 Malignant neoplasm of upper-outer quadrant of right female breast: Secondary | ICD-10-CM | POA: Diagnosis not present

## 2022-12-11 ENCOUNTER — Telehealth: Payer: Self-pay | Admitting: Adult Health

## 2022-12-11 NOTE — Telephone Encounter (Signed)
Rescheduled appointment per provider PAL. Left voicemail. 

## 2022-12-12 IMAGING — MR MR ABDOMEN WO/W CM
18 series · 48 of 48 positions shown · IV contrast (6 ML GADAVIST)
Comparison: MRI 02/21/2021

CLINICAL DATA: Breast cancer.  Assess treatment response.

EXAM:
MRI ABDOMEN WITHOUT AND WITH CONTRAST
TECHNIQUE: Multiplanar multisequence MR imaging of the abdomen was performed
both before and after the administration of intravenous contrast.
CONTRAST:  6mL GADAVIST GADOBUTROL 1 MMOL/ML IV SOLN

[Series 3: cor haste · coronal · 6.0mm · 1.56mm/px · 2 of 35 slices shown]
[im 1/35]
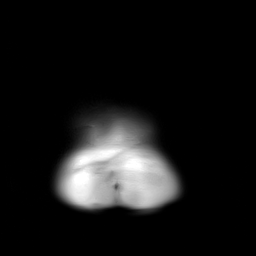
[im 35/35]
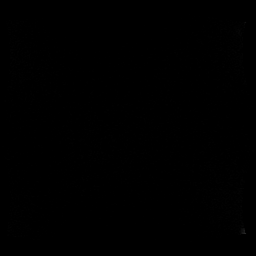

[Series 4: T2 fat-sat · axial · 6.0mm · 1.25mm/px · z∈[-181,+85]mm · 2 of 38 slices shown]
[im 1/38]
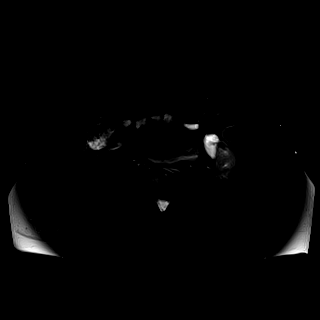
[im 38/38]
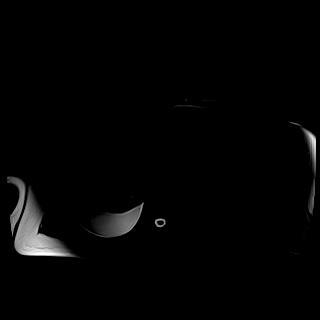

[Series 6: DWI · axial · 6.0mm · 1.68mm/px · z∈[-223,+101]mm · 4 of 92 slices shown (1 of 2)]
[im 1/92]
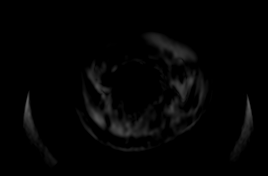
[im 31/92]
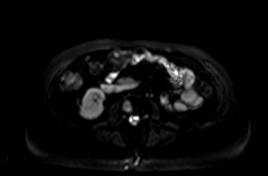
[im 61/92]
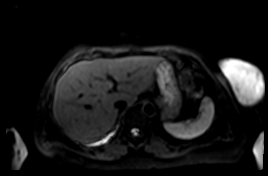
[im 92/92]
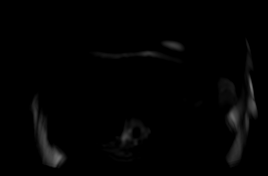

[Series 7: DWI · axial · 6.0mm · 1.68mm/px · z∈[-223,+101]mm · 2 of 46 slices shown (2 of 2)]
[im 1/46]
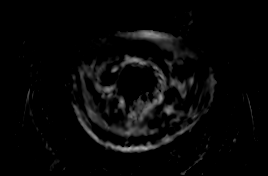
[im 46/46]
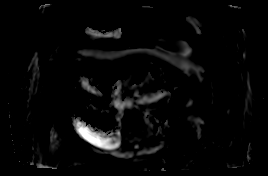

[Series 8: bSSFP · axial · 6.0mm · 2.34mm/px · z∈[-199,+77]mm · 2 of 47 slices shown]
[im 1/47]
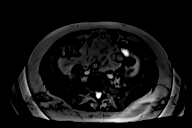
[im 47/47]
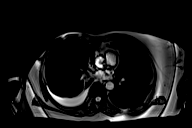

[Series 9: T1 dynamic · axial · 3.0mm · 1.41mm/px · z∈[-204,+81]mm · 3 of 96 slices shown (1 of 9)]
[im 1/96]
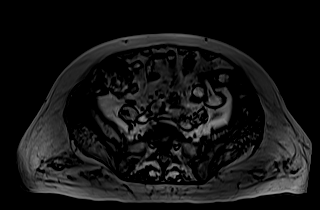
[im 48/96]
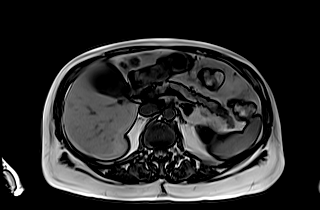
[im 96/96]
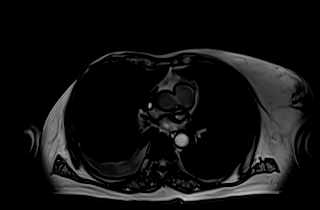

[Series 10: T1 dynamic · axial · 3.0mm · 1.41mm/px · z∈[-204,+81]mm · 3 of 96 slices shown (2 of 9)]
[im 1/96]
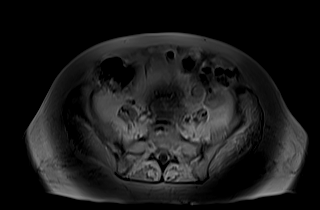
[im 48/96]
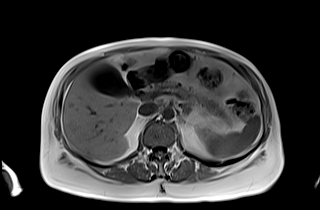
[im 96/96]
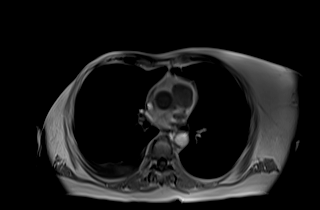

[Series 12: T1 dynamic · axial · 3.0mm · 1.41mm/px · z∈[-204,+81]mm · 3 of 96 slices shown (3 of 9)]
[im 1/96]
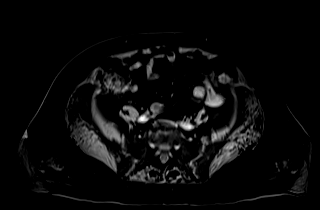
[im 48/96]
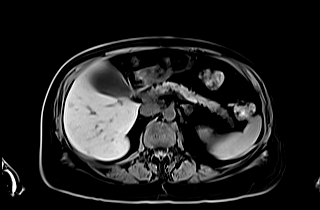
[im 96/96]
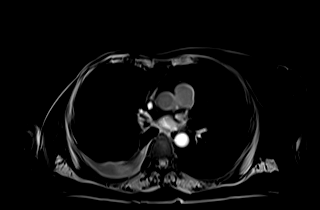

[Series 19: T1 dynamic · axial · 3.0mm · 1.41mm/px · z∈[-204,+81]mm · 3 of 96 slices shown (4 of 9)]
[im 1/96]
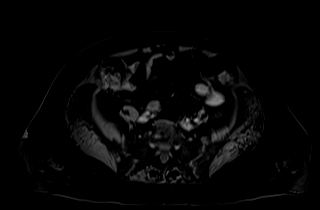
[im 48/96]
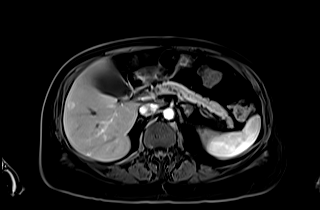
[im 96/96]
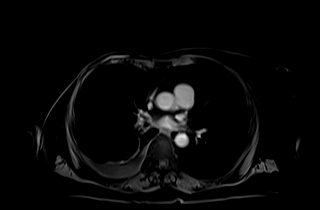

[Series 20: T1 dynamic · axial · 3.0mm · 1.41mm/px · z∈[-204,+81]mm · 3 of 96 slices shown (5 of 9)]
[im 1/96]
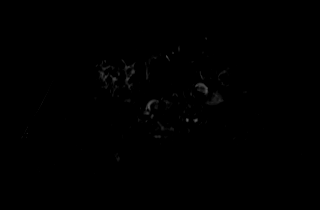
[im 48/96]
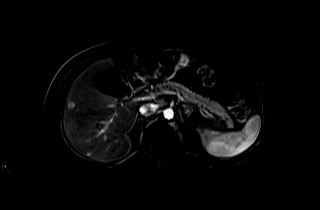
[im 96/96]
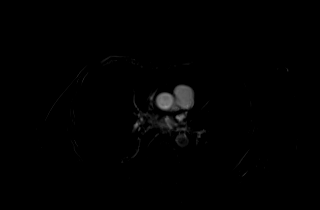

[Series 22: T1 dynamic · axial · 3.0mm · 1.41mm/px · z∈[-204,+81]mm · 3 of 96 slices shown (6 of 9)]
[im 1/96]
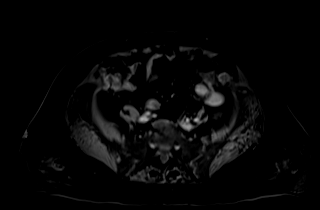
[im 48/96]
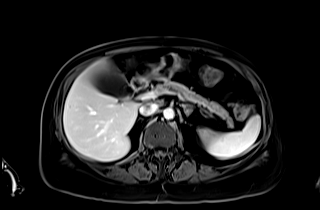
[im 96/96]
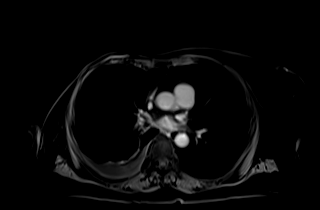

[Series 23: T1 dynamic · axial · 3.0mm · 1.41mm/px · z∈[-204,+81]mm · 3 of 96 slices shown (7 of 9)]
[im 1/96]
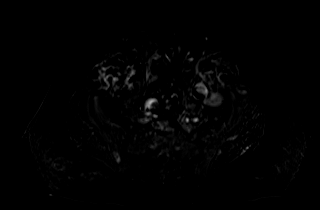
[im 48/96]
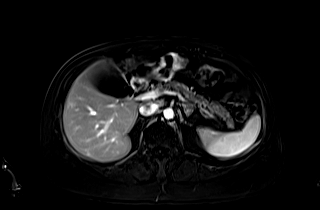
[im 96/96]
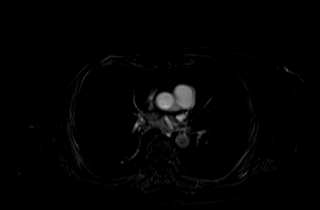

[Series 25: T1 dynamic · axial · 3.0mm · 1.41mm/px · z∈[-204,+81]mm · 3 of 96 slices shown (8 of 9)]
[im 1/96]
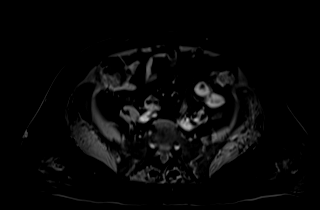
[im 48/96]
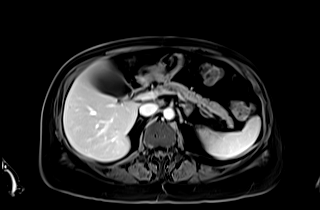
[im 96/96]
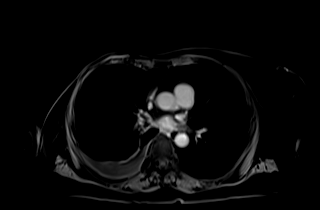

[Series 26: T1 dynamic · axial · 3.0mm · 1.41mm/px · z∈[-204,+81]mm · 3 of 96 slices shown (9 of 9)]
[im 1/96]
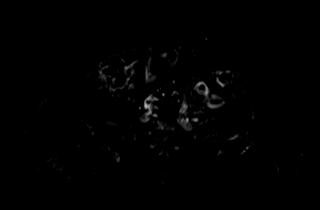
[im 48/96]
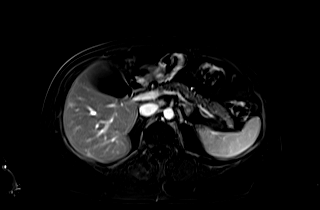
[im 96/96]
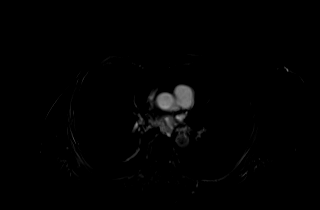

[Series 27: ax_haste_mbh · axial · 6.0mm · 1.41mm/px · 1 of 38 slices shown]
[im 1/38]
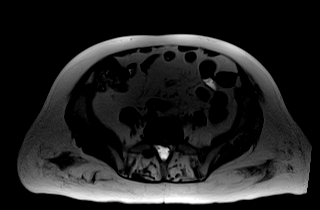

[Series 29: cor_vibe_dixon_delayed_w · coronal · 4.0mm · 2.34mm/px · 2 of 64 slices shown]
[im 1/64]
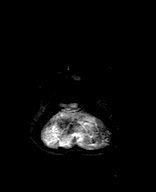
[im 64/64]
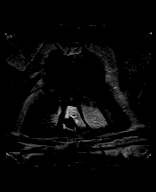

[Series 31: ax_dixon_delayed_w_reg · axial · 3.0mm · 1.41mm/px · z∈[-204,+81]mm · 3 of 96 slices shown]
[im 1/96]
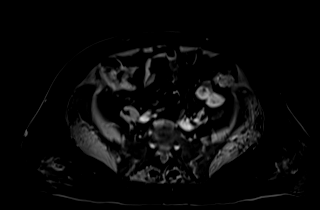
[im 48/96]
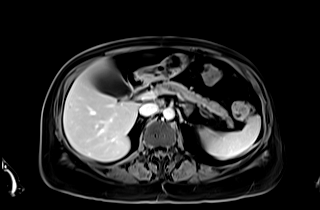
[im 96/96]
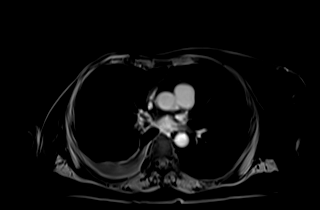

[Series 32: ax_dixon_delayed_w_reg_sub · axial · 3.0mm · 1.41mm/px · z∈[-204,+81]mm · 3 of 96 slices shown]
[im 1/96]
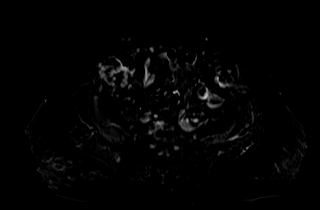
[im 48/96]
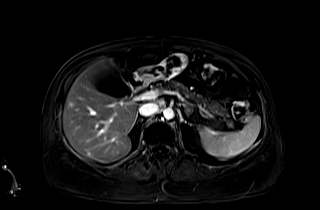
[im 96/96]
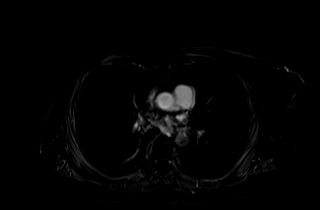

[48 of 48 positions shown; findings below may reference images not displayed]

FINDINGS: Lower chest: Small to moderate right pleural effusion appears mildly
increased in volume from previous exam.

Hepatobiliary: Multifocal liver metastases are again noted. Index
lesion within the lateral segment of left lobe of liver measures
x 1.0 cm, image [DATE]. Stable from previous exam. Index lesion within
the lateral segment of left lobe of liver adjacent to the falciform
ligament measures 1.4 x 1.2 cm, image [DATE]. Previously 1.7 x 1.5 cm.
Index lesion within segment 7 measures 1.1 x 1.1 cm, image [DATE].
Unchanged in the interval.

Gallbladder is unremarkable.  No bile duct dilatation.

Pancreas: No mass, inflammatory changes, or other parenchymal
abnormality identified.

Spleen:  Within normal limits in size and appearance.

Adrenals/Urinary Tract: Normal right adrenal gland. Left adrenal
nodule compatible with a benign adenoma is again noted measuring
cm, image 49/3. No suspicious kidney mass or hydronephrosis.

Stomach/Bowel: Visualized portions within the abdomen are
unremarkable.

Vascular/Lymphatic: No pathologically enlarged lymph nodes
identified. No abdominal aortic aneurysm demonstrated.

Other:  No ascites or fluid collection.

Musculoskeletal: Lesion involving enhancing lesion within the L3
vertebral body appears stable from the previous exam, image [DATE].
Also unchanged are lesions involving the T11 and T10 vertebral
bodies, image [DATE].
IMPRESSION: 1. Stable appearance of multifocal liver metastases. No new or
progressive liver lesions identified.
2. Stable appearance of left adrenal adenoma.
3. Small to moderate right pleural effusion, mildly increased in
volume from previous exam.
4. Stable osseous metastasis to the thoracolumbar spine.

## 2022-12-17 NOTE — Progress Notes (Signed)
Patient Care Team: [name], FNP as PCP - General (Family Medicine) Maisie Fus, MD as PCP - Cardiology (Cardiology) Jake Bathe, MD (Cardiology) Alleen Borne, MD (Cardiothoracic Surgery) Anselm Lis, RPH-CPP (Pharmacist) Nils Pyle, MD as Referring Physician (Radiation Oncology) Serena Croissant, MD as Consulting Physician (Hematology and Oncology)  DIAGNOSIS:  Encounter Diagnoses  Name Primary?   Carcinoma of right breast metastatic to lung [facility]) Yes   Malignant neoplasm of upper-outer quadrant of right breast in female, estrogen receptor positive (HCC)     SUMMARY OF ONCOLOGIC HISTORY: Oncology History  Carcinoma of right breast metastatic to lung (HCC)  10/10/2011 Initial Diagnosis   Carcinoma of right breast metastatic to lung (HCC)   05/02/2022 -  Chemotherapy   Patient is on Treatment Plan : BREAST METASTATIC Fam-Trastuzumab Deruxtecan-nxki (Enhertu) (5.4) q21d     Malignant neoplasm of upper-outer quadrant of right breast in female, estrogen receptor positive (HCC)  03/22/2004 Initial Diagnosis   right mastectomy and sentinel lymph node sampling 03/22/2004 for a right upper-outer quadrant pT2 pN1, stage IIB invasive ductal carcinoma with lobular features, grade 2, estrogen receptor and progesterone receptor positive, HER-2 negative, with an MIB-1 of 9%   05/2004 - 09/18/2004 Chemotherapy   adjuvant chemotherapy with dose dense doxorubicin and cyclophosphamide x4 cycles (first cycle delayed one week) followed by dose dense paclitaxel x4 was completed 09/18/2004   09/2004 - 2009 Anti-estrogen oral therapy   tamoxifen started March 2006, discontinued 2009   08/2011 Relapse/Recurrence   Metastatic disease:large pericardial effusion, large right pleural effusion and possible right middle lobe bronchial obstruction  status post pericardial window placement, fiberoptic bronchoscopy and right Pleurx placement 09/11/2011, with biopsy of the bronchus intermedius  and pericardium positive for metastatic breast cancer, ER 91%, PR 100% p with an MIB-1 of 35%, and no HER-2 amplification, ratio being 1.37   10/10/2011 - 07/2019 Anti-estrogen oral therapy   BOLERO-4 trial 10/10/2011, receiving letrozole and everolimus (stopped for progression)   08/16/2019 Miscellaneous   foundation 1 requested on liver biopsy from 08/16/2019 shows a stable microsatellite status with 1 mutation/MB, amplification of C11orf30, FGF10 and RICTOR, with mutations in ESR1 and PTEN   08/16/2019 Miscellaneous   Liver biopsy: Metastatic carcinoma to the liver consistent with breast primary, ER 100%, PR 100%, Ki-67 5%, HER2 2+ by IHC, FISH HER2 negative   08/24/2019 -  Anti-estrogen oral therapy   fulvestrant started 08/24/2019, repeated every 4 weeks Palbociclib 125 mg daily 21 days on 7 days off started 08/25/2019 Switched to ribociclib s starting 08/22/2020   02/28/2021 -  Radiation Therapy   palliative radiation (Dr. Langston Masker in [place]) 02/28/2021 to right hip area   10/27/2021 -  Anti-estrogen oral therapy   Abemaciclib with fulvestrant   01/14/2022 Miscellaneous   Guardant360: ESR 1 mutation (no other mutations) MSI high was not detected, PTEN, T p53, EGFR amplification, RB1 mutations detected without any approved treatment options     CHIEF COMPLIANT: Follow-up Metastatic carcinoma cycle 10 Enhertu     INTERVAL HISTORY: Barbara Parks is a 72 y.o With Metastatic carcinoma cycle 9 Enhertu. She presents to the clinic for a follow-up and treatment. She reports that treatment is going good. She does have fatigue a couple days after and then in about a week she is back to normal.   ALLERGIES:  is allergic to aspirin, latex, and nsaids.  MEDICATIONS:  Current Outpatient Medications  Medication Sig Dispense Refill   carvedilol (COREG) 3.125 MG tablet  1 tablet with food Orally Twice a day     cholecalciferol (VITAMIN D3) 25 MCG (1000 UT) tablet Take 2 tablets (2,000 Units total)  by mouth daily. 180 tablet 4   ezetimibe (ZETIA) 10 MG tablet Take 1 tablet (10 mg total) by mouth daily. 90 tablet 3   fam-trastuzumab deruxtecan-nxki (ENHERTU) 100 MG SOLR as directed Intravenous     gemfibrozil (LOPID) 600 MG tablet TAKE 1 TABLET (600 MG TOTAL) BY MOUTH 2 (TWO) TIMES DAILY BEFORE A MEAL. 180 tablet 2   lidocaine-prilocaine (EMLA) cream Apply to affected area once 30 g 3   loperamide (IMODIUM) 2 MG capsule Take 2 mg by mouth 4 (four) times daily as needed for diarrhea or loose stools. Reported on 11/30/2015     metFORMIN (GLUCOPHAGE-XR) 500 MG 24 hr tablet Take 1 tablet (500 mg total) by mouth daily with supper. Followed by [facility]  3   omeprazole (PRILOSEC OTC) 20 MG tablet Take 20 mg by mouth daily as needed (acid reflux).     ondansetron (ZOFRAN) 8 MG tablet Take 1 tablet (8 mg total) by mouth every 8 (eight) hours as needed for nausea or vomiting. Start on the third day after chemotherapy. 30 tablet 1   prochlorperazine (COMPAZINE) 10 MG tablet Take 1 tablet (10 mg total) by mouth every 6 (six) hours as needed for nausea or vomiting. 30 tablet 1   triamcinolone (KENALOG) 0.025 % ointment Apply 1 Application topically 2 (two) times daily. 15 g 0   No current facility-administered medications for this visit.    PHYSICAL EXAMINATION: ECOG PERFORMANCE STATUS: 1 - Symptomatic but completely ambulatory  Vitals:   12/20/22 0946  BP: 113/64  Pulse: 91  Resp: 16  Temp: 97.7 F (36.5 C)  SpO2: 98%   Filed Weights   12/20/22 0946  Weight: 141 lb 9 oz (64.2 kg)      LABORATORY DATA:  I have reviewed the data as listed    Latest Ref Rng & Units 12/20/2022    9:22 AM 11/29/2022    9:51 AM 11/08/2022    9:34 AM  CMP  Glucose 70 - 99 mg/dL 161  096  045   BUN 8 - 23 mg/dL 21  23  17    Creatinine 0.44 - 1.00 mg/dL 4.09  8.11  9.14   Sodium 135 - 145 mmol/L 139  138  139   Potassium 3.5 - 5.1 mmol/L 4.1  4.4  4.4   Chloride 98 - 111 mmol/L 110  108  108   CO2 22  - 32 mmol/L 20  22  24    Calcium 8.9 - 10.3 mg/dL 9.4  9.6  9.9   Total Protein 6.5 - 8.1 g/dL 7.2  7.5  7.7   Total Bilirubin 0.3 - 1.2 mg/dL 0.2  0.3  0.3   Alkaline Phos 38 - 126 U/L 96  100  105   AST 15 - 41 U/L 13  13  13    ALT 0 - 44 U/L 7  6  7      Lab Results  Component Value Date   WBC 8.6 12/20/2022   HGB 12.9 12/20/2022   HCT 39.1 12/20/2022   MCV 98.7 12/20/2022   PLT 347 12/20/2022   NEUTROABS 6.5 12/20/2022    ASSESSMENT & PLAN:  Malignant neoplasm of upper-outer quadrant of right breast in female, estrogen receptor positive (HCC) Metastatic breast cancer (pericardial effusion, pleural effusion, right middle lobe lung lesion, ER 91%, PR 90%,  Ki-67 35%, HER2 negative diagnosed February 2023)   (Prior history of right mastectomy 2005, adjuvant chemo with Adriamycin Cytoxan and Taxol followed by tamoxifen)   Current treatment:  1. Ribociclib, fulvestrant, Xgeva, ribociclib discontinued 10/18/2021  2. abemaciclib started 10/27/2021 Foundation 1 in 2021: MSI stable, TMB 1, ESR 1 mutation, PTEN, FGF 10, RICTOR amplifications   Guardant360 01/14/2022: No actionable mutations.  ESR 1 mutation. Liver biopsy 02/26/2022: Metastatic carcinoma consistent with breast primary. Caris mutation testing 02/26/2022: HER2 low, ESR 1 mutation, PTEN   Treatment plan: Enhertu every 3 weeks, today cycle 12 She has a puppy named Ricky Kizzie Ide Tzu 7 months old).   Enhertu toxicities: Fatigue that lasted for 4 to 5 days: We reduced the dosage of her chemotherapy with cycle 8 Nausea and vomiting: Much better controlled Intermittent diarrhea: Improved with Imodium Fatigue and loss of taste and appetite: Last for 1 week: Hopefully she will do better with lower dosage.   08/30/2022: CT chest: Stable moderate right pleural effusion with associated near complete collapse right lower lobe.  Unchanged bone metastases. 11/26/2022: CT CAP: No significant interval change.  Stable right-sided pleural effusion  5 mm lung nodule stable bone mets CA 27-29 10/18/2022: 129.8 (was 695 on 05/23/2022)   return to clinic every 3 weeks for Enhertu and scans in Oct 2024    No orders of the defined types were placed in this encounter.  The patient has a good understanding of the overall plan. she agrees with it. she will call with any problems that may develop before the next visit here. Total time spent: 30 mins including face to face time and time spent for planning, charting and co-ordination of care   Tamsen Meek, MD 12/20/22    I Janan Ridge am acting as a Neurosurgeon for [employer]  I have reviewed the above documentation for accuracy and completeness, and I agree with the above.

## 2022-12-19 MED FILL — Fosaprepitant Dimeglumine For IV Infusion 150 MG (Base Eq): INTRAVENOUS | Qty: 5 | Status: AC

## 2022-12-19 MED FILL — Dexamethasone Sodium Phosphate Inj 100 MG/10ML: INTRAMUSCULAR | Qty: 1 | Status: AC

## 2022-12-20 ENCOUNTER — Inpatient Hospital Stay: Payer: Medicare Other

## 2022-12-20 ENCOUNTER — Other Ambulatory Visit: Payer: Self-pay | Admitting: Hematology and Oncology

## 2022-12-20 ENCOUNTER — Inpatient Hospital Stay (HOSPITAL_BASED_OUTPATIENT_CLINIC_OR_DEPARTMENT_OTHER): Payer: Medicare Other | Admitting: Hematology and Oncology

## 2022-12-20 VITALS — BP 113/64 | HR 91 | Temp 97.7°F | Resp 16 | Ht 63.5 in | Wt 141.6 lb

## 2022-12-20 DIAGNOSIS — Z79899 Other long term (current) drug therapy: Secondary | ICD-10-CM | POA: Diagnosis not present

## 2022-12-20 DIAGNOSIS — C50411 Malignant neoplasm of upper-outer quadrant of right female breast: Secondary | ICD-10-CM

## 2022-12-20 DIAGNOSIS — C50911 Malignant neoplasm of unspecified site of right female breast: Secondary | ICD-10-CM | POA: Diagnosis not present

## 2022-12-20 DIAGNOSIS — C7800 Secondary malignant neoplasm of unspecified lung: Secondary | ICD-10-CM | POA: Diagnosis not present

## 2022-12-20 DIAGNOSIS — Z170 Estrogen receptor positive status [ER+]: Secondary | ICD-10-CM | POA: Diagnosis not present

## 2022-12-20 DIAGNOSIS — C7951 Secondary malignant neoplasm of bone: Secondary | ICD-10-CM

## 2022-12-20 DIAGNOSIS — Z5112 Encounter for antineoplastic immunotherapy: Secondary | ICD-10-CM | POA: Diagnosis not present

## 2022-12-20 DIAGNOSIS — R978 Other abnormal tumor markers: Secondary | ICD-10-CM

## 2022-12-20 DIAGNOSIS — C787 Secondary malignant neoplasm of liver and intrahepatic bile duct: Secondary | ICD-10-CM | POA: Diagnosis not present

## 2022-12-20 DIAGNOSIS — Z95828 Presence of other vascular implants and grafts: Secondary | ICD-10-CM

## 2022-12-20 LAB — CMP (CANCER CENTER ONLY)
ALT: 7 U/L (ref 0–44)
AST: 13 U/L — ABNORMAL LOW (ref 15–41)
Albumin: 4.3 g/dL (ref 3.5–5.0)
Alkaline Phosphatase: 96 U/L (ref 38–126)
Anion gap: 9 (ref 5–15)
BUN: 21 mg/dL (ref 8–23)
CO2: 20 mmol/L — ABNORMAL LOW (ref 22–32)
Calcium: 9.4 mg/dL (ref 8.9–10.3)
Chloride: 110 mmol/L (ref 98–111)
Creatinine: 0.9 mg/dL (ref 0.44–1.00)
GFR, Estimated: >60 mL/min (ref >60)
Glucose, Bld: 122 mg/dL — ABNORMAL HIGH (ref 70–99)
Potassium: 4.1 mmol/L (ref 3.5–5.1)
Sodium: 139 mmol/L (ref 135–145)
Total Bilirubin: 0.2 mg/dL — ABNORMAL LOW (ref 0.3–1.2)
Total Protein: 7.2 g/dL (ref 6.5–8.1)

## 2022-12-20 LAB — CBC WITH DIFFERENTIAL (CANCER CENTER ONLY)
Abs Immature Granulocytes: 0.09 K/uL — ABNORMAL HIGH (ref 0.00–0.07)
Basophils Absolute: 0.1 K/uL (ref 0.0–0.1)
Basophils Relative: 1 %
Eosinophils Absolute: 0.1 K/uL (ref 0.0–0.5)
Eosinophils Relative: 2 %
HCT: 39.1 % (ref 36.0–46.0)
Hemoglobin: 12.9 g/dL (ref 12.0–15.0)
Immature Granulocytes: 1 %
Lymphocytes Relative: 7 %
Lymphs Abs: 0.6 K/uL — ABNORMAL LOW (ref 0.7–4.0)
MCH: 32.6 pg (ref 26.0–34.0)
MCHC: 33 g/dL (ref 30.0–36.0)
MCV: 98.7 fL (ref 80.0–100.0)
Monocytes Absolute: 1.2 K/uL — ABNORMAL HIGH (ref 0.1–1.0)
Monocytes Relative: 14 %
Neutro Abs: 6.5 K/uL (ref 1.7–7.7)
Neutrophils Relative %: 75 %
Platelet Count: 347 K/uL (ref 150–400)
RBC: 3.96 MIL/uL (ref 3.87–5.11)
RDW: 15 % (ref 11.5–15.5)
WBC Count: 8.6 K/uL (ref 4.0–10.5)
nRBC: 0 % (ref 0.0–0.2)

## 2022-12-20 MED ORDER — SODIUM CHLORIDE 0.9 % IV SOLN
150.0000 mg | Freq: Once | INTRAVENOUS | Status: AC
  Administered 2022-12-20: 150 mg via INTRAVENOUS
  Filled 2022-12-20: qty 150

## 2022-12-20 MED ORDER — PALONOSETRON HCL INJECTION 0.25 MG/5ML
0.2500 mg | Freq: Once | INTRAVENOUS | Status: AC
  Administered 2022-12-20: 0.25 mg via INTRAVENOUS
  Filled 2022-12-20: qty 5

## 2022-12-20 MED ORDER — SODIUM CHLORIDE 0.9% FLUSH
10.0000 mL | Freq: Once | INTRAVENOUS | Status: AC
  Administered 2022-12-20: 10 mL

## 2022-12-20 MED ORDER — ACETAMINOPHEN 325 MG PO TABS
650.0000 mg | ORAL_TABLET | Freq: Once | ORAL | Status: AC
  Administered 2022-12-20: 650 mg via ORAL
  Filled 2022-12-20: qty 2

## 2022-12-20 MED ORDER — DIPHENHYDRAMINE HCL 25 MG PO CAPS
50.0000 mg | ORAL_CAPSULE | Freq: Once | ORAL | Status: AC
  Administered 2022-12-20: 50 mg via ORAL
  Filled 2022-12-20: qty 2

## 2022-12-20 MED ORDER — SODIUM CHLORIDE 0.9 % IV SOLN
10.0000 mg | Freq: Once | INTRAVENOUS | Status: AC
  Administered 2022-12-20: 10 mg via INTRAVENOUS
  Filled 2022-12-20: qty 10

## 2022-12-20 MED ORDER — FAM-TRASTUZUMAB DERUXTECAN-NXKI CHEMO 100 MG IV SOLR
3.2000 mg/kg | Freq: Once | INTRAVENOUS | Status: AC
  Administered 2022-12-20: 200 mg via INTRAVENOUS
  Filled 2022-12-20: qty 10

## 2022-12-20 MED ORDER — DEXTROSE 5 % IV SOLN: Freq: Once | INTRAVENOUS | Status: AC

## 2022-12-20 NOTE — Patient Instructions (Signed)
[facility]  Discharge Instructions: Thank you for choosing [facility] to provide your oncology and hematology care.   If you have a lab appointment with the Cancer Center, please go directly to the Cancer Center and check in at the registration area.   Wear comfortable clothing and clothing appropriate for easy access to any Portacath or PICC line.   We strive to give you quality time with your provider. You may need to reschedule your appointment if you arrive late (15 or more minutes).  Arriving late affects you and other patients whose appointments are after yours.  Also, if you miss three or more appointments without notifying the office, you may be dismissed from the clinic at the provider's discretion.      For prescription refill requests, have your pharmacy contact our office and allow 72 hours for refills to be completed.    Today you received the following chemotherapy and/or immunotherapy agents: fam-trastuzumab dertuxtecan-nxki      To help prevent nausea and vomiting after your treatment, we encourage you to take your nausea medication as directed.  BELOW ARE SYMPTOMS THAT SHOULD BE REPORTED IMMEDIATELY: *FEVER GREATER THAN 100.4 F (38 C) OR HIGHER *CHILLS OR SWEATING *NAUSEA AND VOMITING THAT IS NOT CONTROLLED WITH YOUR NAUSEA MEDICATION *UNUSUAL SHORTNESS OF BREATH *UNUSUAL BRUISING OR BLEEDING *URINARY PROBLEMS (pain or burning when urinating, or frequent urination) *BOWEL PROBLEMS (unusual diarrhea, constipation, pain near the anus) TENDERNESS IN MOUTH AND THROAT WITH OR WITHOUT PRESENCE OF ULCERS (sore throat, sores in mouth, or a toothache) UNUSUAL RASH, SWELLING OR PAIN  UNUSUAL VAGINAL DISCHARGE OR ITCHING   Items with * indicate a potential emergency and should be followed up as soon as possible or go to the Emergency Department if any problems should occur.  Please show the CHEMOTHERAPY ALERT CARD or  IMMUNOTHERAPY ALERT CARD at check-in to the Emergency Department and triage nurse.  Should you have questions after your visit or need to cancel or reschedule your appointment, please contact [facility]  Dept: [phone]  and follow the prompts.  Office hours are 8:00 a.m. to 4:30 p.m. Monday - Friday. Please note that voicemails left after 4:00 p.m. may not be returned until the following business day.  We are closed weekends and major holidays. You have access to a nurse at all times for urgent questions. Please call the main number to the clinic Dept: [phone] and follow the prompts.   For any non-urgent questions, you may also contact your provider using MyChart. We now offer e-Visits for anyone 18 and older to request care online for non-urgent symptoms. For details visit [email or web address].   Also download the MyChart app! Go to the app store, search "MyChart", open the app, select [facility], and log in with your MyChart username and password.   

## 2022-12-20 NOTE — Assessment & Plan Note (Addendum)
Metastatic breast cancer (pericardial effusion, pleural effusion, right middle lobe lung lesion, ER 91%, PR 90%, Ki-67 35%, HER2 negative diagnosed February 2023)   (Prior history of right mastectomy 2005, adjuvant chemo with Adriamycin Cytoxan and Taxol followed by tamoxifen)   Current treatment:  1. Ribociclib, fulvestrant, Xgeva, ribociclib discontinued 10/18/2021  2. abemaciclib started 10/27/2021 Foundation 1 in 2021: MSI stable, TMB 1, ESR 1 mutation, PTEN, FGF 10, RICTOR amplifications   Guardant360 01/14/2022: No actionable mutations.  ESR 1 mutation. Liver biopsy 02/26/2022: Metastatic carcinoma consistent with breast primary. Caris mutation testing 02/26/2022: HER2 low, ESR 1 mutation, PTEN   Treatment plan: Enhertu every 3 weeks, today cycle 12 She has a puppy named Ricky Kizzie Ide Tzu 7 months old).   Enhertu toxicities: Fatigue that lasted for 4 to 5 days: We reduced the dosage of her chemotherapy with cycle 8 Nausea and vomiting: Much better controlled Intermittent diarrhea: Improved with Imodium Fatigue and loss of taste and appetite: Last for 1 week: Hopefully she will do better with lower dosage.   08/30/2022: CT chest: Stable moderate right pleural effusion with associated near complete collapse right lower lobe.  Unchanged bone metastases. 11/26/2022: CT CAP: No significant interval change.  Stable right-sided pleural effusion 5 mm lung nodule stable bone mets CA 27-29 10/18/2022: 129.8 (was 695 on 05/23/2022)   return to clinic every 3 weeks for Enhertu and scans in Oct 2024

## 2022-12-21 LAB — CANCER ANTIGEN 27.29: CA 27.29: 118.6 U/mL — ABNORMAL HIGH (ref 0.0–38.6)

## 2023-01-09 ENCOUNTER — Other Ambulatory Visit: Payer: Self-pay

## 2023-01-09 ENCOUNTER — Encounter: Payer: Self-pay | Admitting: Adult Health

## 2023-01-09 ENCOUNTER — Inpatient Hospital Stay (HOSPITAL_BASED_OUTPATIENT_CLINIC_OR_DEPARTMENT_OTHER): Payer: Medicare Other | Admitting: Adult Health

## 2023-01-09 ENCOUNTER — Encounter: Payer: Self-pay | Admitting: General Practice

## 2023-01-09 ENCOUNTER — Inpatient Hospital Stay: Payer: Medicare Other | Attending: Oncology

## 2023-01-09 ENCOUNTER — Inpatient Hospital Stay: Payer: Medicare Other

## 2023-01-09 VITALS — HR 95 | Resp 18

## 2023-01-09 VITALS — BP 98/65 | HR 127 | Temp 97.2°F | Resp 18 | Ht 63.5 in | Wt 139.8 lb

## 2023-01-09 DIAGNOSIS — C50911 Malignant neoplasm of unspecified site of right female breast: Secondary | ICD-10-CM | POA: Diagnosis not present

## 2023-01-09 DIAGNOSIS — C7951 Secondary malignant neoplasm of bone: Secondary | ICD-10-CM | POA: Insufficient documentation

## 2023-01-09 DIAGNOSIS — C7800 Secondary malignant neoplasm of unspecified lung: Secondary | ICD-10-CM

## 2023-01-09 DIAGNOSIS — I3131 Malignant pericardial effusion in diseases classified elsewhere: Secondary | ICD-10-CM

## 2023-01-09 DIAGNOSIS — C50411 Malignant neoplasm of upper-outer quadrant of right female breast: Secondary | ICD-10-CM | POA: Insufficient documentation

## 2023-01-09 DIAGNOSIS — Z95828 Presence of other vascular implants and grafts: Secondary | ICD-10-CM

## 2023-01-09 DIAGNOSIS — Z79899 Other long term (current) drug therapy: Secondary | ICD-10-CM | POA: Diagnosis not present

## 2023-01-09 DIAGNOSIS — Z5112 Encounter for antineoplastic immunotherapy: Secondary | ICD-10-CM | POA: Diagnosis not present

## 2023-01-09 DIAGNOSIS — Z170 Estrogen receptor positive status [ER+]: Secondary | ICD-10-CM

## 2023-01-09 DIAGNOSIS — R978 Other abnormal tumor markers: Secondary | ICD-10-CM

## 2023-01-09 DIAGNOSIS — C787 Secondary malignant neoplasm of liver and intrahepatic bile duct: Secondary | ICD-10-CM | POA: Insufficient documentation

## 2023-01-09 LAB — CBC WITH DIFFERENTIAL (CANCER CENTER ONLY)
Abs Immature Granulocytes: 0.05 10*3/uL (ref 0.00–0.07)
Basophils Absolute: 0 10*3/uL (ref 0.0–0.1)
Basophils Relative: 0 %
Eosinophils Absolute: 0 10*3/uL (ref 0.0–0.5)
Eosinophils Relative: 0 %
HCT: 38.3 % (ref 36.0–46.0)
Hemoglobin: 12.6 g/dL (ref 12.0–15.0)
Immature Granulocytes: 1 %
Lymphocytes Relative: 8 %
Lymphs Abs: 0.7 10*3/uL (ref 0.7–4.0)
MCH: 31.9 pg (ref 26.0–34.0)
MCHC: 32.9 g/dL (ref 30.0–36.0)
MCV: 97 fL (ref 80.0–100.0)
Monocytes Absolute: 1.5 10*3/uL — ABNORMAL HIGH (ref 0.1–1.0)
Monocytes Relative: 19 %
Neutro Abs: 5.8 10*3/uL (ref 1.7–7.7)
Neutrophils Relative %: 72 %
Platelet Count: 277 10*3/uL (ref 150–400)
RBC: 3.95 MIL/uL (ref 3.87–5.11)
RDW: 15.1 % (ref 11.5–15.5)
WBC Count: 8 10*3/uL (ref 4.0–10.5)
nRBC: 0 % (ref 0.0–0.2)

## 2023-01-09 LAB — CMP (CANCER CENTER ONLY)
ALT: 18 U/L (ref 0–44)
AST: 35 U/L (ref 15–41)
Albumin: 4 g/dL (ref 3.5–5.0)
Alkaline Phosphatase: 91 U/L (ref 38–126)
Anion gap: 10 (ref 5–15)
BUN: 20 mg/dL (ref 8–23)
CO2: 19 mmol/L — ABNORMAL LOW (ref 22–32)
Calcium: 9.2 mg/dL (ref 8.9–10.3)
Chloride: 105 mmol/L (ref 98–111)
Creatinine: 1.1 mg/dL — ABNORMAL HIGH (ref 0.44–1.00)
GFR, Estimated: 54 mL/min — ABNORMAL LOW (ref >60)
Glucose, Bld: 152 mg/dL — ABNORMAL HIGH (ref 70–99)
Potassium: 3.9 mmol/L (ref 3.5–5.1)
Sodium: 134 mmol/L — ABNORMAL LOW (ref 135–145)
Total Bilirubin: 0.2 mg/dL — ABNORMAL LOW (ref 0.3–1.2)
Total Protein: 7.6 g/dL (ref 6.5–8.1)

## 2023-01-09 MED ORDER — FAM-TRASTUZUMAB DERUXTECAN-NXKI CHEMO 100 MG IV SOLR
3.2000 mg/kg | Freq: Once | INTRAVENOUS | Status: AC
  Administered 2023-01-09: 200 mg via INTRAVENOUS
  Filled 2023-01-09: qty 10

## 2023-01-09 MED ORDER — CARVEDILOL 3.125 MG PO TABS
3.1250 mg | ORAL_TABLET | Freq: Two times a day (BID) | ORAL | 2 refills | Status: DC
Start: 2023-01-09 — End: 2023-04-18

## 2023-01-09 MED ORDER — SODIUM CHLORIDE 0.9 % IV SOLN
150.0000 mg | Freq: Once | INTRAVENOUS | Status: AC
  Administered 2023-01-09: 150 mg via INTRAVENOUS
  Filled 2023-01-09: qty 150

## 2023-01-09 MED ORDER — SODIUM CHLORIDE 0.9 % IV SOLN
10.0000 mg | Freq: Once | INTRAVENOUS | Status: AC
  Administered 2023-01-09: 10 mg via INTRAVENOUS
  Filled 2023-01-09: qty 10

## 2023-01-09 MED ORDER — SODIUM CHLORIDE 0.9% FLUSH
10.0000 mL | INTRAVENOUS | Status: DC | PRN
  Administered 2023-01-09: 10 mL

## 2023-01-09 MED ORDER — ACETAMINOPHEN 325 MG PO TABS
650.0000 mg | ORAL_TABLET | Freq: Once | ORAL | Status: AC
  Administered 2023-01-09: 650 mg via ORAL
  Filled 2023-01-09: qty 2

## 2023-01-09 MED ORDER — SODIUM CHLORIDE 0.9% FLUSH
10.0000 mL | Freq: Once | INTRAVENOUS | Status: AC
  Administered 2023-01-09: 10 mL

## 2023-01-09 MED ORDER — DEXTROSE 5 % IV SOLN: Freq: Once | INTRAVENOUS | Status: AC

## 2023-01-09 MED ORDER — DIPHENHYDRAMINE HCL 25 MG PO CAPS
50.0000 mg | ORAL_CAPSULE | Freq: Once | ORAL | Status: AC
  Administered 2023-01-09: 50 mg via ORAL
  Filled 2023-01-09: qty 2

## 2023-01-09 MED ORDER — PALONOSETRON HCL INJECTION 0.25 MG/5ML
0.2500 mg | Freq: Once | INTRAVENOUS | Status: AC
  Administered 2023-01-09: 0.25 mg via INTRAVENOUS
  Filled 2023-01-09: qty 5

## 2023-01-09 MED ORDER — HEPARIN SOD (PORK) LOCK FLUSH 100 UNIT/ML IV SOLN
500.0000 [IU] | Freq: Once | INTRAVENOUS | Status: AC | PRN
  Administered 2023-01-09: 500 [IU]

## 2023-01-09 NOTE — Assessment & Plan Note (Signed)
Metastatic breast cancer (pericardial effusion, pleural effusion, right middle lobe lung lesion, ER 91%, PR 90%, Ki-67 35%, HER2 negative diagnosed February 2023)   (Prior history of right mastectomy 2005, adjuvant chemo with Adriamycin Cytoxan and Taxol followed by tamoxifen)   Current treatment:  1. Ribociclib, fulvestrant, Xgeva, ribociclib discontinued 10/18/2021  2. abemaciclib started 10/27/2021 Foundation 1 in 2021: MSI stable, TMB 1, ESR 1 mutation, PTEN, FGF 10, RICTOR amplifications   Guardant360 01/14/2022: No actionable mutations.  ESR 1 mutation. Liver biopsy 02/26/2022: Metastatic carcinoma consistent with breast primary. Caris mutation testing 02/26/2022: HER2 low, ESR 1 mutation, PTEN    08/30/2022: CT chest: Stable moderate right pleural effusion with associated near complete collapse right lower lobe.  Unchanged bone metastases. 11/26/2022: CT CAP: No significant interval change.  Stable right-sided pleural effusion 5 mm lung nodule stable bone mets CA 27-29 10/18/2022: 129.8 (was 695 on 05/23/2022)   Treatment plan: Enhertu every 3 weeks, today cycle 13 She has a puppy named Barbara Parks 7 months old).   Enhertu toxicities: Fatigue: Status post dose reduction Nausea and vomiting: Resolved at this point Intermittent diarrhea: managed with Imodium as needed Anxiety related to family stressors: I sent a message to Barbara Parks and Barbara Parks to see if someone could reach out to the patient offer support and help get her in with a counselor that she can talk through this stress with. At risk for heart failure: Her most recent echocardiogram was low normal.  She had seen Dr. Wyline Parks however due to her phone issues never received a message about starting on carvedilol.  I had previously reviewed with Dr. Wyline Parks her blood pressure and heart rates in clinic.  I verified she still desired the patient to take the carvedilol.  I reviewed that with Barbara Parks today and sent the prescription  in.  We discussed risks and benefits of the carvedilol today in detail.  She will repeat echo before her next visit with Dr. Pamelia Parks.  He has no clinical signs of metastatic breast cancer progression. RTC in 3 weeks for labs, f/u with Dr. Pamelia Parks, and next treatment.  Scans planned for 04/2023

## 2023-01-09 NOTE — Progress Notes (Signed)
[facility] Spiritual Care Note  Referred by Mardella Layman Causey/NP for support resources regarding a personal concern. Visited with Barbara Parks in infusion, building rapport and providing reflective listening, normalization of feelings, and emotional support as she shared about her concern. She looks forward to exploring the [email or web address] Find a Visual merchandiser to pursue support and clinical insight for her concern. She was very appreciative of conversation and has direct Spiritual Care number in case additional needs arise or she needs more assistance in finding a constructive match.   [address] [place], [place], [facility] Pager [phone] Voicemail [phone]

## 2023-01-09 NOTE — Progress Notes (Signed)
[facility] Cancer Follow up:    Barbara Patience, FNP [address] [place] [address]   DIAGNOSIS:  Cancer Staging  Carcinoma of right breast metastatic to lung [facility]) Staging form: Breast, AJCC 7th Edition - Clinical: Stage IV (TX, NX, M1) - Signed by Victorino December, MD on 09/09/2013 Laterality: Right Biopsy of metastatic site performed: No - Pathologic: No stage assigned - Unsigned Laterality: Right  Malignant neoplasm of upper-outer quadrant of right breast in female, estrogen receptor positive (HCC) Staging form: Breast, AJCC 7th Edition - Clinical: Stage IV (TX, NX, M1) - Signed by Lowella Dell, MD on 08/13/2014   SUMMARY OF ONCOLOGIC HISTORY: Oncology History  Carcinoma of right breast metastatic to lung (HCC)  10/10/2011 Initial Diagnosis   Carcinoma of right breast metastatic to lung (HCC)   05/02/2022 -  Chemotherapy   Patient is on Treatment Plan : BREAST METASTATIC Fam-Trastuzumab Deruxtecan-nxki (Enhertu) (5.4) q21d     Malignant neoplasm of upper-outer quadrant of right breast in female, estrogen receptor positive (HCC)  03/22/2004 Initial Diagnosis   right mastectomy and sentinel lymph node sampling 03/22/2004 for a right upper-outer quadrant pT2 pN1, stage IIB invasive ductal carcinoma with lobular features, grade 2, estrogen receptor and progesterone receptor positive, HER-2 negative, with an MIB-1 of 9%   05/2004 - 09/18/2004 Chemotherapy   adjuvant chemotherapy with dose dense doxorubicin and cyclophosphamide x4 cycles (first cycle delayed one week) followed by dose dense paclitaxel x4 was completed 09/18/2004   09/2004 - 2009 Anti-estrogen oral therapy   tamoxifen started March 2006, discontinued 2009   08/2011 Relapse/Recurrence   Metastatic disease:large pericardial effusion, large right pleural effusion and possible right middle lobe bronchial obstruction  status post pericardial window placement, fiberoptic bronchoscopy and  right Pleurx placement 09/11/2011, with biopsy of the bronchus intermedius and pericardium positive for metastatic breast cancer, ER 91%, PR 100% p with an MIB-1 of 35%, and no HER-2 amplification, ratio being 1.37   10/10/2011 - 07/2019 Anti-estrogen oral therapy   BOLERO-4 trial 10/10/2011, receiving letrozole and everolimus (stopped for progression)   08/16/2019 Miscellaneous   foundation 1 requested on liver biopsy from 08/16/2019 shows a stable microsatellite status with 1 mutation/MB, amplification of C11orf30, FGF10 and RICTOR, with mutations in ESR1 and PTEN   08/16/2019 Miscellaneous   Liver biopsy: Metastatic carcinoma to the liver consistent with breast primary, ER 100%, PR 100%, Ki-67 5%, HER2 2+ by IHC, FISH HER2 negative   08/24/2019 -  Anti-estrogen oral therapy   fulvestrant started 08/24/2019, repeated every 4 weeks Palbociclib 125 mg daily 21 days on 7 days off started 08/25/2019 Switched to ribociclib s starting 08/22/2020   02/28/2021 -  Radiation Therapy   palliative radiation (Dr. Langston Masker in [place]) 02/28/2021 to right hip area   10/27/2021 -  Anti-estrogen oral therapy   Abemaciclib with fulvestrant   01/14/2022 Miscellaneous   Guardant360: ESR 1 mutation (no other mutations) MSI high was not detected, PTEN, T p53, EGFR amplification, RB1 mutations detected without any approved treatment options     CURRENT THERAPY: Enhertu  INTERVAL HISTORY: Barbara Parks 72 y.o. female returns for f/u prior to receiving Enhertu.  She is doing moderately well today.  Last week she visited family in [place].  She notes that she had tremendous difficulty in getting along with her sister.  This has caused tremendous increase in stress for her.  Carvedilol is on her med list.  I had attempted to call her a few  weeks ago to tell her about the prescription recommendation however could not get through and did not receive a call back.  She tells me that she had a voicemail malfunction  with her phone and she had a going get a new phone and her phone issue should be resolved at this point.   Patient Active Problem List   Diagnosis Date Noted   Port-A-Cath in place 06/13/2022   Malignant neoplasm metastatic to liver (HCC) 08/09/2019   Malignant neoplasm metastatic to bone (HCC) 12/23/2018   Goals of care, counseling/discussion 12/23/2018   Hyperglycemia 10/02/2017   Hepatic steatosis 01/23/2017   OA (osteoarthritis) of hip 06/07/2015   Elevated cholesterol with high triglycerides 03/22/2015   Malignant neoplasm of upper-outer quadrant of right breast in female, estrogen receptor positive (HCC) 08/13/2014   Thoracic aorta atherosclerosis (HCC) 08/13/2014   Proteinuria 06/16/2014   Hypercalcemia 06/16/2014   Fatigue 04/21/2014   ADHD (attention deficit hyperactivity disorder)    Osteopenia due to cancer therapy 09/09/2013   Carcinoma of right breast metastatic to lung (HCC) 10/10/2011   Malignant pericardial effusion 09/10/2011   Pleural effusion 09/10/2011   Pneumonia 08/26/2011   Asthma in adult without complication 08/20/2010   Osteoarthritis of left hip 08/17/2010    is allergic to aspirin, latex, and nsaids.  MEDICAL HISTORY: Past Medical History:  Diagnosis Date   Arthritis    left hip   Asthma    breast ca 2005   breast/chemo R mastectomy   Breast cancer (HCC)    Dermatitis    Diabetes mellitus without complication (HCC)    GERD (gastroesophageal reflux disease)    Hypercholesteremia    Metastasis to lung (HCC) dx'd 08/2011   Osteopenia due to cancer therapy 09/09/2013   Palpitations    Personal history of chemotherapy    Shortness of breath     SURGICAL HISTORY: Past Surgical History:  Procedure Laterality Date   BREAST SURGERY  2005   right   CATARACT EXTRACTION W/PHACO Left 01/18/2014   Procedure: CATARACT EXTRACTION PHACO AND INTRAOCULAR LENS PLACEMENT (IOC);  Surgeon: Loraine Leriche T. Nile Riggs, MD;  Location: [facility];  Service: Ophthalmology;   Laterality: Left;  CDE 15.79   CATARACT EXTRACTION W/PHACO Right 02/08/2014   Procedure: CATARACT EXTRACTION PHACO AND INTRAOCULAR LENS PLACEMENT (IOC);  Surgeon: Loraine Leriche T. Nile Riggs, MD;  Location: [facility];  Service: Ophthalmology;  Laterality: Right;  CDE 4.41   CHEST TUBE INSERTION  09/11/2011   Procedure: INSERTION PLEURAL DRAINAGE CATHETER;  Surgeon: Alleen Borne, MD;  Location: [facility];  Service: Thoracic;  Laterality: Right;   IR IMAGING GUIDED PORT INSERTION  04/23/2022   MASTECTOMY Right    PERICARDIAL WINDOW  09/11/2011   Procedure: PERICARDIAL WINDOW;  Surgeon: Alleen Borne, MD;  Location: [facility];  Service: Thoracic;  Laterality: N/A;   REMOVAL OF PLEURAL DRAINAGE CATHETER  12/19/2011   Procedure: REMOVAL OF PLEURAL DRAINAGE CATHETER;  Surgeon: Alleen Borne, MD;  Location: [facility];  Service: Thoracic;  Laterality: Right;  TO BE DONE IN MINOR ROOM, SHORT STAY   SPINE SURGERY  1996   TOTAL HIP ARTHROPLASTY Left 06/07/2015   Procedure: LEFT TOTAL HIP ARTHROPLASTY ANTERIOR APPROACH;  Surgeon: Ollen Gross, MD;  Location: [facility];  Service: Orthopedics;  Laterality: Left;   VIDEO BRONCHOSCOPY  09/11/2011   Procedure: VIDEO BRONCHOSCOPY;  Surgeon: Alleen Borne, MD;  Location: [facility];  Service: Thoracic;  Laterality: N/A;    SOCIAL HISTORY: Social History   Socioeconomic History  Marital status: Single    Spouse name: Not on file   Number of children: Not on file   Years of education: Not on file   Highest education level: Not on file  Occupational History   Not on file  Tobacco Use   Smoking status: Former    Packs/day: 1.50    Years: 30.00    Additional pack years: 0.00    Total pack years: 45.00    Types: Cigarettes    Quit date: 09/10/2007    Years since quitting: 15.3   Smokeless tobacco: Never  Substance and Sexual Activity   Alcohol use: No   Drug use: No   Sexual activity: Not Currently  Other Topics Concern   Not on file  Social History Narrative   Not on file    Social Determinants of Health   Financial Resource Strain: Not on file  Food Insecurity: Not on file  Transportation Needs: Not on file  Physical Activity: Not on file  Stress: Not on file  Social Connections: Not on file  Intimate Partner Violence: Not on file    FAMILY HISTORY: Family History  Problem Relation Age of Onset   Cancer Mother        breast   Heart disease Father    Diabetes Other    Anesthesia problems Neg Hx     Review of Systems  Constitutional:  Positive for fatigue. Negative for appetite change, chills, fever and unexpected weight change.  HENT:   Negative for hearing loss, lump/mass and trouble swallowing.   Eyes:  Negative for eye problems and icterus.  Respiratory:  Negative for chest tightness, cough and shortness of breath.   Cardiovascular:  Negative for chest pain, leg swelling and palpitations.  Gastrointestinal:  Negative for abdominal distention, abdominal pain, constipation, diarrhea, nausea and vomiting.  Endocrine: Negative for hot flashes.  Genitourinary:  Negative for difficulty urinating.   Musculoskeletal:  Negative for arthralgias.  Skin:  Negative for itching and rash.  Neurological:  Negative for dizziness, extremity weakness, headaches and numbness.  Hematological:  Negative for adenopathy. Does not bruise/bleed easily.  Psychiatric/Behavioral:  Negative for depression. The patient is nervous/anxious.       PHYSICAL EXAMINATION   Onc Performance Status - 01/09/23 1317       ECOG Perf Status   ECOG Perf Status Fully active, able to carry on all pre-disease performance without restriction      KPS SCALE   KPS % SCORE Able to carry on normal activity, minor s/s of disease   Drove to [place] last week alone            Vitals:   01/09/23 1312  BP: 98/65  Pulse: (!) 127  Resp: 18  Temp: (!) 97.2 F (36.2 C)  SpO2: 97%    Physical Exam Constitutional:      General: She is not in acute distress.    Appearance: Normal  appearance. She is not toxic-appearing.  HENT:     Head: Normocephalic and atraumatic.     Mouth/Throat:     Mouth: Mucous membranes are moist.     Pharynx: Oropharynx is clear. No oropharyngeal exudate or posterior oropharyngeal erythema.  Eyes:     General: No scleral icterus. Cardiovascular:     Rate and Rhythm: Normal rate and regular rhythm.     Pulses: Normal pulses.     Heart sounds: Normal heart sounds.  Pulmonary:     Effort: Pulmonary effort is normal.  Breath sounds: Normal breath sounds.  Abdominal:     General: Abdomen is flat. Bowel sounds are normal. There is no distension.     Palpations: Abdomen is soft.     Tenderness: There is no abdominal tenderness.  Musculoskeletal:        General: No swelling.     Cervical back: Neck supple.  Lymphadenopathy:     Cervical: No cervical adenopathy.  Skin:    General: Skin is warm and dry.     Findings: No rash.  Neurological:     General: No focal deficit present.     Mental Status: She is alert.  Psychiatric:        Mood and Affect: Mood normal.        Behavior: Behavior normal.     LABORATORY DATA:  CBC    Component Value Date/Time   WBC 8.0 01/09/2023 1255   WBC 10.0 02/26/2022 1123   RBC 3.95 01/09/2023 1255   HGB 12.6 01/09/2023 1255   HGB 14.1 07/08/2017 0942   HCT 38.3 01/09/2023 1255   HCT 42.9 07/08/2017 0942   PLT 277 01/09/2023 1255   PLT 283 07/08/2017 0942   MCV 97.0 01/09/2023 1255   MCV 81.1 07/08/2017 0942   MCH 31.9 01/09/2023 1255   MCHC 32.9 01/09/2023 1255   RDW 15.1 01/09/2023 1255   RDW 14.6 (H) 07/08/2017 0942   LYMPHSABS 0.7 01/09/2023 1255   LYMPHSABS 1.2 07/08/2017 0942   MONOABS 1.5 (H) 01/09/2023 1255   MONOABS 0.7 07/08/2017 0942   EOSABS 0.0 01/09/2023 1255   EOSABS 0.1 07/08/2017 0942   BASOSABS 0.0 01/09/2023 1255   BASOSABS 0.0 07/08/2017 0942    CMP     Component Value Date/Time   NA 134 (L) 01/09/2023 1255   NA 139 07/08/2017 0942   K 3.9 01/09/2023 1255    K 3.8 07/08/2017 0942   CL 105 01/09/2023 1255   CL 109 (H) 12/31/2012 0940   CO2 19 (L) 01/09/2023 1255   CO2 18 (L) 07/08/2017 0942   GLUCOSE 152 (H) 01/09/2023 1255   GLUCOSE 156 (H) 07/08/2017 0942   GLUCOSE 127 (H) 12/31/2012 0940   BUN 20 01/09/2023 1255   BUN 17.7 07/08/2017 0942   CREATININE 1.10 (H) 01/09/2023 1255   CREATININE 1.0 07/08/2017 0942   CALCIUM 9.2 01/09/2023 1255   CALCIUM 10.4 07/08/2017 0942   PROT 7.6 01/09/2023 1255   PROT 7.7 07/08/2017 0942   ALBUMIN 4.0 01/09/2023 1255   ALBUMIN 3.4 (L) 07/08/2017 0942   AST 35 01/09/2023 1255   AST 54 (H) 07/08/2017 0942   ALT 18 01/09/2023 1255   ALT 39 07/08/2017 0942   ALKPHOS 91 01/09/2023 1255   ALKPHOS 232 (H) 07/08/2017 0942   BILITOT 0.2 (L) 01/09/2023 1255   BILITOT 0.42 07/08/2017 0942   GFRNONAA 54 (L) 01/09/2023 1255   GFRAA 49 (L) 04/05/2020 1504   GFRAA 54 (L) 12/25/2017 0959      ASSESSMENT and THERAPY PLAN:   Malignant neoplasm of upper-outer quadrant of right breast in female, estrogen receptor positive (HCC) Metastatic breast cancer (pericardial effusion, pleural effusion, right middle lobe lung lesion, ER 91%, PR 90%, Ki-67 35%, HER2 negative diagnosed February 2023)   (Prior history of right mastectomy 2005, adjuvant chemo with Adriamycin Cytoxan and Taxol followed by tamoxifen)   Current treatment:  1. Ribociclib, fulvestrant, Xgeva, ribociclib discontinued 10/18/2021  2. abemaciclib started 10/27/2021 Foundation 1 in 2021: MSI stable, TMB 1, ESR 1 mutation,  PTEN, FGF 10, RICTOR amplifications   Guardant360 01/14/2022: No actionable mutations.  ESR 1 mutation. Liver biopsy 02/26/2022: Metastatic carcinoma consistent with breast primary. Caris mutation testing 02/26/2022: HER2 low, ESR 1 mutation, PTEN    08/30/2022: CT chest: Stable moderate right pleural effusion with associated near complete collapse right lower lobe.  Unchanged bone metastases. 11/26/2022: CT CAP: No significant  interval change.  Stable right-sided pleural effusion 5 mm lung nodule stable bone mets CA 27-29 10/18/2022: 129.8 (was 695 on 05/23/2022)   Treatment plan: Enhertu every 3 weeks, today cycle 13 She has a puppy named Ricky Kizzie Ide Tzu 7 months old).   Enhertu toxicities: Fatigue: Status post dose reduction Nausea and vomiting: Resolved at this point Intermittent diarrhea: managed with Imodium as needed Anxiety related to family stressors: I sent a message to [employer] and [employer] to see if someone could reach out to the patient offer support and help get her in with a counselor that she can talk through this stress with. At risk for heart failure: Her most recent echocardiogram was low normal.  She had seen Dr. Wyline Mood however due to her phone issues never received a message about starting on carvedilol.  I had previously reviewed with Dr. Wyline Mood her blood pressure and heart rates in clinic.  I verified she still desired the patient to take the carvedilol.  I reviewed that with Bonita Quin today and sent the prescription in.  We discussed risks and benefits of the carvedilol today in detail.  She will repeat echo before her next visit with Dr. [name].  He has no clinical signs of metastatic breast cancer progression. RTC in 3 weeks for labs, f/u with Dr. [name], and next treatment.  Scans planned for 04/2023    All questions were answered. The patient knows to call the clinic with any problems, questions or concerns. We can certainly see the patient much sooner if necessary.  Total encounter time:30 minutes*in face-to-face visit time, chart review, lab review, care coordination, order entry, and documentation of the encounter time.    Lillard Anes, NP 01/09/23 1:54 PM Medical Oncology and Hematology [facility] [address] [place], [place] [address] Tel. [phone]    Fax. [phone]  *Total Encounter Time as defined by the Centers for Medicare and Medicaid  Services includes, in addition to the face-to-face time of a patient visit (documented in the note above) non-face-to-face time: obtaining and reviewing outside history, ordering and reviewing medications, tests or procedures, care coordination (communications with other health care professionals or caregivers) and documentation in the medical record.

## 2023-01-09 NOTE — Patient Instructions (Signed)
[facility]  Discharge Instructions: Thank you for choosing [facility] to provide your oncology and hematology care.   If you have a lab appointment with the Cancer Center, please go directly to the Cancer Center and check in at the registration area.   Wear comfortable clothing and clothing appropriate for easy access to any Portacath or PICC line.   We strive to give you quality time with your provider. You may need to reschedule your appointment if you arrive late (15 or more minutes).  Arriving late affects you and other patients whose appointments are after yours.  Also, if you miss three or more appointments without notifying the office, you may be dismissed from the clinic at the provider's discretion.      For prescription refill requests, have your pharmacy contact our office and allow 72 hours for refills to be completed.    Today you received the following chemotherapy and/or immunotherapy agents: fam-trastuzumab deruxtecan-nxki      To help prevent nausea and vomiting after your treatment, we encourage you to take your nausea medication as directed.  BELOW ARE SYMPTOMS THAT SHOULD BE REPORTED IMMEDIATELY: *FEVER GREATER THAN 100.4 F (38 C) OR HIGHER *CHILLS OR SWEATING *NAUSEA AND VOMITING THAT IS NOT CONTROLLED WITH YOUR NAUSEA MEDICATION *UNUSUAL SHORTNESS OF BREATH *UNUSUAL BRUISING OR BLEEDING *URINARY PROBLEMS (pain or burning when urinating, or frequent urination) *BOWEL PROBLEMS (unusual diarrhea, constipation, pain near the anus) TENDERNESS IN MOUTH AND THROAT WITH OR WITHOUT PRESENCE OF ULCERS (sore throat, sores in mouth, or a toothache) UNUSUAL RASH, SWELLING OR PAIN  UNUSUAL VAGINAL DISCHARGE OR ITCHING   Items with * indicate a potential emergency and should be followed up as soon as possible or go to the Emergency Department if any problems should occur.  Please show the CHEMOTHERAPY ALERT CARD or  IMMUNOTHERAPY ALERT CARD at check-in to the Emergency Department and triage nurse.  Should you have questions after your visit or need to cancel or reschedule your appointment, please contact [facility]  Dept: [phone]  and follow the prompts.  Office hours are 8:00 a.m. to 4:30 p.m. Monday - Friday. Please note that voicemails left after 4:00 p.m. may not be returned until the following business day.  We are closed weekends and major holidays. You have access to a nurse at all times for urgent questions. Please call the main number to the clinic Dept: [phone] and follow the prompts.   For any non-urgent questions, you may also contact your provider using MyChart. We now offer e-Visits for anyone 18 and older to request care online for non-urgent symptoms. For details visit [email or web address].   Also download the MyChart app! Go to the app store, search "MyChart", open the app, select [facility], and log in with your MyChart username and password.   

## 2023-01-10 ENCOUNTER — Ambulatory Visit: Payer: Medicare Other | Admitting: Adult Health

## 2023-01-10 ENCOUNTER — Other Ambulatory Visit: Payer: Medicare Other

## 2023-01-10 ENCOUNTER — Ambulatory Visit: Payer: Medicare Other

## 2023-01-10 LAB — CANCER ANTIGEN 27.29: CA 27.29: 140.6 U/mL — ABNORMAL HIGH (ref 0.0–38.6)

## 2023-01-17 ENCOUNTER — Ambulatory Visit (HOSPITAL_COMMUNITY): Payer: Medicare Other

## 2023-01-20 ENCOUNTER — Telehealth: Payer: Self-pay | Admitting: Hematology and Oncology

## 2023-01-20 NOTE — Telephone Encounter (Signed)
Rescheduled and cancelled appointments per provider PAL. Left voicemail.

## 2023-01-29 ENCOUNTER — Ambulatory Visit (HOSPITAL_COMMUNITY)
Admission: RE | Admit: 2023-01-29 | Discharge: 2023-01-29 | Disposition: A | Discharge status: Home / Self Care | Payer: Medicare Other | Source: Ambulatory Visit | Attending: Adult Health | Admitting: Adult Health

## 2023-01-29 DIAGNOSIS — I3131 Malignant pericardial effusion in diseases classified elsewhere: Secondary | ICD-10-CM | POA: Diagnosis not present

## 2023-01-29 DIAGNOSIS — Z0189 Encounter for other specified special examinations: Secondary | ICD-10-CM | POA: Diagnosis not present

## 2023-01-29 DIAGNOSIS — C50911 Malignant neoplasm of unspecified site of right female breast: Secondary | ICD-10-CM | POA: Diagnosis not present

## 2023-01-29 DIAGNOSIS — C7800 Secondary malignant neoplasm of unspecified lung: Secondary | ICD-10-CM

## 2023-01-29 MED FILL — Fosaprepitant Dimeglumine For IV Infusion 150 MG (Base Eq): INTRAVENOUS | Qty: 5 | Status: AC

## 2023-01-29 MED FILL — Dexamethasone Sodium Phosphate Inj 100 MG/10ML: INTRAMUSCULAR | Qty: 1 | Status: AC

## 2023-01-30 LAB — ECHOCARDIOGRAM COMPLETE
Area-P 1/2: 3.42 cm2
S' Lateral: 2.7 cm

## 2023-01-31 ENCOUNTER — Ambulatory Visit: Payer: Medicare Other | Admitting: Hematology and Oncology

## 2023-01-31 ENCOUNTER — Inpatient Hospital Stay: Payer: Medicare Other | Attending: Oncology

## 2023-01-31 ENCOUNTER — Inpatient Hospital Stay: Payer: Medicare Other

## 2023-01-31 ENCOUNTER — Other Ambulatory Visit: Payer: Medicare Other

## 2023-01-31 ENCOUNTER — Other Ambulatory Visit: Payer: Self-pay

## 2023-01-31 VITALS — BP 112/72 | HR 81 | Temp 97.8°F | Resp 18 | Wt 139.5 lb

## 2023-01-31 DIAGNOSIS — Z5111 Encounter for antineoplastic chemotherapy: Secondary | ICD-10-CM | POA: Diagnosis not present

## 2023-01-31 DIAGNOSIS — C50911 Malignant neoplasm of unspecified site of right female breast: Secondary | ICD-10-CM

## 2023-01-31 DIAGNOSIS — C50411 Malignant neoplasm of upper-outer quadrant of right female breast: Secondary | ICD-10-CM | POA: Insufficient documentation

## 2023-01-31 DIAGNOSIS — C7951 Secondary malignant neoplasm of bone: Secondary | ICD-10-CM | POA: Diagnosis not present

## 2023-01-31 DIAGNOSIS — Z79899 Other long term (current) drug therapy: Secondary | ICD-10-CM | POA: Diagnosis not present

## 2023-01-31 DIAGNOSIS — Z95828 Presence of other vascular implants and grafts: Secondary | ICD-10-CM

## 2023-01-31 DIAGNOSIS — Z5112 Encounter for antineoplastic immunotherapy: Secondary | ICD-10-CM | POA: Diagnosis present

## 2023-01-31 DIAGNOSIS — R978 Other abnormal tumor markers: Secondary | ICD-10-CM

## 2023-01-31 LAB — CMP (CANCER CENTER ONLY)
ALT: 9 U/L (ref 0–44)
AST: 15 U/L (ref 15–41)
Albumin: 3.8 g/dL (ref 3.5–5.0)
Alkaline Phosphatase: 122 U/L (ref 38–126)
Anion gap: 11 (ref 5–15)
BUN: 15 mg/dL (ref 8–23)
CO2: 18 mmol/L — ABNORMAL LOW (ref 22–32)
Calcium: 9.5 mg/dL (ref 8.9–10.3)
Chloride: 109 mmol/L (ref 98–111)
Creatinine: 1 mg/dL (ref 0.44–1.00)
GFR, Estimated: 60 mL/min — ABNORMAL LOW (ref >60)
Glucose, Bld: 113 mg/dL — ABNORMAL HIGH (ref 70–99)
Potassium: 3.6 mmol/L (ref 3.5–5.1)
Sodium: 138 mmol/L (ref 135–145)
Total Bilirubin: 0.3 mg/dL (ref 0.3–1.2)
Total Protein: 6.8 g/dL (ref 6.5–8.1)

## 2023-01-31 LAB — CBC WITH DIFFERENTIAL (CANCER CENTER ONLY)
Abs Immature Granulocytes: 0.1 10*3/uL — ABNORMAL HIGH (ref 0.00–0.07)
Basophils Absolute: 0.1 10*3/uL (ref 0.0–0.1)
Basophils Relative: 1 %
Eosinophils Absolute: 0.1 10*3/uL (ref 0.0–0.5)
Eosinophils Relative: 1 %
HCT: 37.5 % (ref 36.0–46.0)
Hemoglobin: 12.1 g/dL (ref 12.0–15.0)
Immature Granulocytes: 1 %
Lymphocytes Relative: 6 %
Lymphs Abs: 0.6 10*3/uL — ABNORMAL LOW (ref 0.7–4.0)
MCH: 31.6 pg (ref 26.0–34.0)
MCHC: 32.3 g/dL (ref 30.0–36.0)
MCV: 97.9 fL (ref 80.0–100.0)
Monocytes Absolute: 1.5 10*3/uL — ABNORMAL HIGH (ref 0.1–1.0)
Monocytes Relative: 17 %
Neutro Abs: 6.6 10*3/uL (ref 1.7–7.7)
Neutrophils Relative %: 74 %
Platelet Count: 474 10*3/uL — ABNORMAL HIGH (ref 150–400)
RBC: 3.83 MIL/uL — ABNORMAL LOW (ref 3.87–5.11)
RDW: 15.4 % (ref 11.5–15.5)
WBC Count: 9 10*3/uL (ref 4.0–10.5)
nRBC: 0 % (ref 0.0–0.2)

## 2023-01-31 MED ORDER — DEXTROSE 5 % IV SOLN: Freq: Once | INTRAVENOUS | Status: AC

## 2023-01-31 MED ORDER — PALONOSETRON HCL INJECTION 0.25 MG/5ML
0.2500 mg | Freq: Once | INTRAVENOUS | Status: AC
  Administered 2023-01-31: 0.25 mg via INTRAVENOUS
  Filled 2023-01-31: qty 5

## 2023-01-31 MED ORDER — DIPHENHYDRAMINE HCL 25 MG PO CAPS
50.0000 mg | ORAL_CAPSULE | Freq: Once | ORAL | Status: AC
  Administered 2023-01-31: 50 mg via ORAL
  Filled 2023-01-31: qty 2

## 2023-01-31 MED ORDER — HEPARIN SOD (PORK) LOCK FLUSH 100 UNIT/ML IV SOLN
500.0000 [IU] | Freq: Once | INTRAVENOUS | Status: AC | PRN
  Administered 2023-01-31: 500 [IU]

## 2023-01-31 MED ORDER — FAM-TRASTUZUMAB DERUXTECAN-NXKI CHEMO 100 MG IV SOLR
3.2000 mg/kg | Freq: Once | INTRAVENOUS | Status: AC
  Administered 2023-01-31: 200 mg via INTRAVENOUS
  Filled 2023-01-31: qty 10

## 2023-01-31 MED ORDER — SODIUM CHLORIDE 0.9 % IV SOLN
150.0000 mg | Freq: Once | INTRAVENOUS | Status: AC
  Administered 2023-01-31: 150 mg via INTRAVENOUS
  Filled 2023-01-31: qty 150

## 2023-01-31 MED ORDER — SODIUM CHLORIDE 0.9% FLUSH
10.0000 mL | INTRAVENOUS | Status: DC | PRN
  Administered 2023-01-31: 10 mL

## 2023-01-31 MED ORDER — SODIUM CHLORIDE 0.9% FLUSH
10.0000 mL | Freq: Once | INTRAVENOUS | Status: AC
  Administered 2023-01-31: 10 mL

## 2023-01-31 MED ORDER — ACETAMINOPHEN 325 MG PO TABS
650.0000 mg | ORAL_TABLET | Freq: Once | ORAL | Status: AC
  Administered 2023-01-31: 650 mg via ORAL
  Filled 2023-01-31: qty 2

## 2023-01-31 MED ORDER — SODIUM CHLORIDE 0.9 % IV SOLN
10.0000 mg | Freq: Once | INTRAVENOUS | Status: AC
  Administered 2023-01-31: 10 mg via INTRAVENOUS
  Filled 2023-01-31: qty 10

## 2023-01-31 NOTE — Patient Instructions (Signed)
[facility]  Discharge Instructions: Thank you for choosing [facility] to provide your oncology and hematology care.   If you have a lab appointment with the Cancer Center, please go directly to the Cancer Center and check in at the registration area.   Wear comfortable clothing and clothing appropriate for easy access to any Portacath or PICC line.   We strive to give you quality time with your provider. You may need to reschedule your appointment if you arrive late (15 or more minutes).  Arriving late affects you and other patients whose appointments are after yours.  Also, if you miss three or more appointments without notifying the office, you may be dismissed from the clinic at the provider's discretion.      For prescription refill requests, have your pharmacy contact our office and allow 72 hours for refills to be completed.    Today you received the following chemotherapy and/or immunotherapy agents: fam-trastuzumab dertuxtecan-nxki      To help prevent nausea and vomiting after your treatment, we encourage you to take your nausea medication as directed.  BELOW ARE SYMPTOMS THAT SHOULD BE REPORTED IMMEDIATELY: *FEVER GREATER THAN 100.4 F (38 C) OR HIGHER *CHILLS OR SWEATING *NAUSEA AND VOMITING THAT IS NOT CONTROLLED WITH YOUR NAUSEA MEDICATION *UNUSUAL SHORTNESS OF BREATH *UNUSUAL BRUISING OR BLEEDING *URINARY PROBLEMS (pain or burning when urinating, or frequent urination) *BOWEL PROBLEMS (unusual diarrhea, constipation, pain near the anus) TENDERNESS IN MOUTH AND THROAT WITH OR WITHOUT PRESENCE OF ULCERS (sore throat, sores in mouth, or a toothache) UNUSUAL RASH, SWELLING OR PAIN  UNUSUAL VAGINAL DISCHARGE OR ITCHING   Items with * indicate a potential emergency and should be followed up as soon as possible or go to the Emergency Department if any problems should occur.  Please show the CHEMOTHERAPY ALERT CARD or  IMMUNOTHERAPY ALERT CARD at check-in to the Emergency Department and triage nurse.  Should you have questions after your visit or need to cancel or reschedule your appointment, please contact [facility]  Dept: [phone]  and follow the prompts.  Office hours are 8:00 a.m. to 4:30 p.m. Monday - Friday. Please note that voicemails left after 4:00 p.m. may not be returned until the following business day.  We are closed weekends and major holidays. You have access to a nurse at all times for urgent questions. Please call the main number to the clinic Dept: [phone] and follow the prompts.   For any non-urgent questions, you may also contact your provider using MyChart. We now offer e-Visits for anyone 18 and older to request care online for non-urgent symptoms. For details visit [email or web address].   Also download the MyChart app! Go to the app store, search "MyChart", open the app, select [facility], and log in with your MyChart username and password.   

## 2023-02-01 LAB — CANCER ANTIGEN 27.29: CA 27.29: 143.4 U/mL — ABNORMAL HIGH (ref 0.0–38.6)

## 2023-02-05 ENCOUNTER — Encounter: Payer: Self-pay | Admitting: Internal Medicine

## 2023-02-05 ENCOUNTER — Other Ambulatory Visit: Payer: Self-pay | Admitting: Internal Medicine

## 2023-02-19 NOTE — Progress Notes (Signed)
Patient Care Team: [name], FNP as PCP - General (Family Medicine) Maisie Fus, MD as PCP - Cardiology (Cardiology) Jake Bathe, MD (Cardiology) Alleen Borne, MD (Cardiothoracic Surgery) Anselm Lis, RPH-CPP (Pharmacist) Nils Pyle, MD as Referring Physician (Radiation Oncology) Serena Croissant, MD as Consulting Physician (Hematology and Oncology)  DIAGNOSIS:  Encounter Diagnosis  Name Primary?   Carcinoma of right breast metastatic to lung (HCC) Yes    SUMMARY OF ONCOLOGIC HISTORY: Oncology History  Carcinoma of right breast metastatic to lung (HCC)  10/10/2011 Initial Diagnosis   Carcinoma of right breast metastatic to lung (HCC)   05/02/2022 -  Chemotherapy   Patient is on Treatment Plan : BREAST METASTATIC Fam-Trastuzumab Deruxtecan-nxki (Enhertu) (5.4) q21d     Malignant neoplasm of upper-outer quadrant of right breast in female, estrogen receptor positive (HCC)  03/22/2004 Initial Diagnosis   right mastectomy and sentinel lymph node sampling 03/22/2004 for a right upper-outer quadrant pT2 pN1, stage IIB invasive ductal carcinoma with lobular features, grade 2, estrogen receptor and progesterone receptor positive, HER-2 negative, with an MIB-1 of 9%   05/2004 - 09/18/2004 Chemotherapy   adjuvant chemotherapy with dose dense doxorubicin and cyclophosphamide x4 cycles (first cycle delayed one week) followed by dose dense paclitaxel x4 was completed 09/18/2004   09/2004 - 2009 Anti-estrogen oral therapy   tamoxifen started March 2006, discontinued 2009   08/2011 Relapse/Recurrence   Metastatic disease:large pericardial effusion, large right pleural effusion and possible right middle lobe bronchial obstruction  status post pericardial window placement, fiberoptic bronchoscopy and right Pleurx placement 09/11/2011, with biopsy of the bronchus intermedius and pericardium positive for metastatic breast cancer, ER 91%, PR 100% p with an MIB-1 of 35%, and no  HER-2 amplification, ratio being 1.37   10/10/2011 - 07/2019 Anti-estrogen oral therapy   BOLERO-4 trial 10/10/2011, receiving letrozole and everolimus (stopped for progression)   08/16/2019 Miscellaneous   foundation 1 requested on liver biopsy from 08/16/2019 shows a stable microsatellite status with 1 mutation/MB, amplification of C11orf30, FGF10 and RICTOR, with mutations in ESR1 and PTEN   08/16/2019 Miscellaneous   Liver biopsy: Metastatic carcinoma to the liver consistent with breast primary, ER 100%, PR 100%, Ki-67 5%, HER2 2+ by IHC, FISH HER2 negative   08/24/2019 -  Anti-estrogen oral therapy   fulvestrant started 08/24/2019, repeated every 4 weeks Palbociclib 125 mg daily 21 days on 7 days off started 08/25/2019 Switched to ribociclib s starting 08/22/2020   02/28/2021 -  Radiation Therapy   palliative radiation (Dr. Langston Masker in [place]) 02/28/2021 to right hip area   10/27/2021 -  Anti-estrogen oral therapy   Abemaciclib with fulvestrant   01/14/2022 Miscellaneous   Guardant360: ESR 1 mutation (no other mutations) MSI high was not detected, PTEN, T p53, EGFR amplification, RB1 mutations detected without any approved treatment options     CHIEF COMPLIANT: Follow-up Metastatic carcinoma cycle 15 Enhertu   INTERVAL HISTORY: Barbara Parks is a 72 y.o With Metastatic carcinoma cycle 15 Enhertu. She presents to the clinic for a follow-up. Pt reports a disk in neck and it is affecting her left arm. She says she is having a lot of pain in neck. It bothers her mostly when she is picking up things. She had some symptoms a couple of weeks ago. She denies any fever but lost her sense of smell.   ALLERGIES:  is allergic to aspirin, latex, and nsaids.  MEDICATIONS:  Current Outpatient Medications  Medication Sig Dispense Refill  carvedilol (COREG) 3.125 MG tablet Take 1 tablet (3.125 mg total) by mouth 2 (two) times daily with a meal. 60 tablet 2   cholecalciferol (VITAMIN D3) 25 MCG  (1000 UT) tablet Take 2 tablets (2,000 Units total) by mouth daily. 180 tablet 4   ezetimibe (ZETIA) 10 MG tablet Take 1 tablet (10 mg total) by mouth daily. 90 tablet 3   fam-trastuzumab deruxtecan-nxki (ENHERTU) 100 MG SOLR as directed Intravenous     gemfibrozil (LOPID) 600 MG tablet TAKE 1 TABLET (600 MG TOTAL) BY MOUTH 2 (TWO) TIMES DAILY BEFORE A MEAL. 180 tablet 2   lidocaine-prilocaine (EMLA) cream Apply to affected area once 30 g 3   loperamide (IMODIUM) 2 MG capsule Take 2 mg by mouth 4 (four) times daily as needed for diarrhea or loose stools. Reported on 11/30/2015     metFORMIN (GLUCOPHAGE-XR) 500 MG 24 hr tablet Take 1 tablet (500 mg total) by mouth daily with supper. Followed by [facility]  3   omeprazole (PRILOSEC OTC) 20 MG tablet Take 20 mg by mouth daily as needed (acid reflux).     ondansetron (ZOFRAN) 8 MG tablet Take 1 tablet (8 mg total) by mouth every 8 (eight) hours as needed for nausea or vomiting. Start on the third day after chemotherapy. 30 tablet 1   prochlorperazine (COMPAZINE) 10 MG tablet Take 1 tablet (10 mg total) by mouth every 6 (six) hours as needed for nausea or vomiting. 30 tablet 1   triamcinolone (KENALOG) 0.025 % ointment Apply 1 Application topically 2 (two) times daily. 15 g 0   No current facility-administered medications for this visit.    PHYSICAL EXAMINATION: ECOG PERFORMANCE STATUS: 1 - Symptomatic but completely ambulatory  Vitals:   02/21/23 1118  BP: 118/79  Pulse: (!) 113  Resp: 18  Temp: 97.8 F (36.6 C)  SpO2: 97%   Filed Weights   02/21/23 1118  Weight: 138 lb 9.6 oz (62.9 kg)      LABORATORY DATA:  I have reviewed the data as listed    Latest Ref Rng & Units 02/21/2023   11:04 AM 01/31/2023   10:09 AM 01/09/2023   12:55 PM  CMP  Glucose 70 - 99 mg/dL 92  324  401   BUN 8 - 23 mg/dL 23  15  20    Creatinine 0.44 - 1.00 mg/dL 0.27  2.53  6.64   Sodium 135 - 145 mmol/L 139  138  134   Potassium 3.5 - 5.1 mmol/L 4.1   3.6  3.9   Chloride 98 - 111 mmol/L 108  109  105   CO2 22 - 32 mmol/L 22  18  19    Calcium 8.9 - 10.3 mg/dL 40.3  9.5  9.2   Total Protein 6.5 - 8.1 g/dL 7.2  6.8  7.6   Total Bilirubin 0.3 - 1.2 mg/dL 0.3  0.3  0.2   Alkaline Phos 38 - 126 U/L 122  122  91   AST 15 - 41 U/L 14  15  35   ALT 0 - 44 U/L 7  9  18      Lab Results  Component Value Date   WBC 8.2 02/21/2023   HGB 12.8 02/21/2023   HCT 38.3 02/21/2023   MCV 95.8 02/21/2023   PLT 349 02/21/2023   NEUTROABS 5.9 02/21/2023    ASSESSMENT & PLAN:  Carcinoma of right breast metastatic to lung Metastatic breast cancer (pericardial effusion, pleural effusion, right middle lobe lung lesion,  ER 91%, PR 90%, Ki-67 35%, HER2 negative diagnosed February 2023)   (Prior history of right mastectomy 2005, adjuvant chemo with Adriamycin Cytoxan and Taxol followed by tamoxifen)   Current treatment:  1. Ribociclib, fulvestrant, Xgeva, ribociclib discontinued 10/18/2021  2. abemaciclib started 10/27/2021 Foundation 1 in 2021: MSI stable, TMB 1, ESR 1 mutation, PTEN, FGF 10, RICTOR amplifications   Guardant360 01/14/2022: No actionable mutations.  ESR 1 mutation. Liver biopsy 02/26/2022: Metastatic carcinoma consistent with breast primary. Caris mutation testing 02/26/2022: HER2 low, ESR 1 mutation, PTEN   Treatment plan: Enhertu every 3 weeks, today cycle 15 She has a puppy named Ricky Kizzie Ide Tzu 7 months old).   Enhertu toxicities: Fatigue that lasted for 4 to 5 days: We reduced the dosage of her chemotherapy with cycle 8 Nausea and vomiting: Much better controlled Intermittent diarrhea: Improved with Imodium Fatigue and loss of taste and appetite: Last for 1 week: Hopefully she will do better with lower dosage.   08/30/2022: CT chest: Stable moderate right pleural effusion with associated near complete collapse right lower lobe.  Unchanged bone metastases. 11/26/2022: CT CAP: No significant interval change.  Stable right-sided pleural  effusion 5 mm lung nodule stable bone mets CA 27-29: 143 (was 695 on 05/23/2022)    return to clinic every 3 weeks for Enhertu and scans     Orders Placed This Encounter  Procedures   CBC with Differential (Cancer Center Only)    Standing Status:   Future    Standing Expiration Date:   03/13/2024   CMP (Cancer Center only)    Standing Status:   Future    Standing Expiration Date:   03/13/2024   CBC with Differential (Cancer Center Only)    Standing Status:   Future    Standing Expiration Date:   04/03/2024   CMP (Cancer Center only)    Standing Status:   Future    Standing Expiration Date:   04/03/2024   CBC with Differential (Cancer Center Only)    Standing Status:   Future    Standing Expiration Date:   04/24/2024   CMP (Cancer Center only)    Standing Status:   Future    Standing Expiration Date:   04/24/2024   CBC with Differential (Cancer Center Only)    Standing Status:   Future    Standing Expiration Date:   05/15/2024   CMP (Cancer Center only)    Standing Status:   Future    Standing Expiration Date:   05/15/2024   CBC with Differential (Cancer Center Only)    Standing Status:   Future    Standing Expiration Date:   06/05/2024   CMP (Cancer Center only)    Standing Status:   Future    Standing Expiration Date:   06/05/2024   The patient has a good understanding of the overall plan. she agrees with it. she will call with any problems that may develop before the next visit here. Total time spent: 30 mins including face to face time and time spent for planning, charting and co-ordination of care   Tamsen Meek, MD 02/21/23    I Janan Ridge am acting as a Neurosurgeon for [employer]  I have reviewed the above documentation for accuracy and completeness, and I agree with the above.

## 2023-02-20 MED FILL — Fosaprepitant Dimeglumine For IV Infusion 150 MG (Base Eq): INTRAVENOUS | Qty: 5 | Status: AC

## 2023-02-20 MED FILL — Dexamethasone Sodium Phosphate Inj 100 MG/10ML: INTRAMUSCULAR | Qty: 1 | Status: AC

## 2023-02-21 ENCOUNTER — Inpatient Hospital Stay (HOSPITAL_BASED_OUTPATIENT_CLINIC_OR_DEPARTMENT_OTHER): Payer: Medicare Other | Admitting: Hematology and Oncology

## 2023-02-21 ENCOUNTER — Other Ambulatory Visit: Payer: Self-pay

## 2023-02-21 ENCOUNTER — Inpatient Hospital Stay: Payer: Medicare Other

## 2023-02-21 VITALS — HR 88

## 2023-02-21 VITALS — BP 118/79 | HR 113 | Temp 97.8°F | Resp 18 | Ht 63.5 in | Wt 138.6 lb

## 2023-02-21 DIAGNOSIS — R978 Other abnormal tumor markers: Secondary | ICD-10-CM

## 2023-02-21 DIAGNOSIS — Z95828 Presence of other vascular implants and grafts: Secondary | ICD-10-CM

## 2023-02-21 DIAGNOSIS — C50911 Malignant neoplasm of unspecified site of right female breast: Secondary | ICD-10-CM

## 2023-02-21 DIAGNOSIS — C7800 Secondary malignant neoplasm of unspecified lung: Secondary | ICD-10-CM

## 2023-02-21 DIAGNOSIS — C50411 Malignant neoplasm of upper-outer quadrant of right female breast: Secondary | ICD-10-CM | POA: Diagnosis not present

## 2023-02-21 DIAGNOSIS — Z79899 Other long term (current) drug therapy: Secondary | ICD-10-CM | POA: Diagnosis not present

## 2023-02-21 DIAGNOSIS — C7951 Secondary malignant neoplasm of bone: Secondary | ICD-10-CM | POA: Diagnosis not present

## 2023-02-21 DIAGNOSIS — Z5111 Encounter for antineoplastic chemotherapy: Secondary | ICD-10-CM | POA: Diagnosis not present

## 2023-02-21 LAB — CBC WITH DIFFERENTIAL (CANCER CENTER ONLY)
Abs Immature Granulocytes: 0.09 10*3/uL — ABNORMAL HIGH (ref 0.00–0.07)
Basophils Absolute: 0.1 10*3/uL (ref 0.0–0.1)
Basophils Relative: 2 %
Eosinophils Absolute: 0.2 10*3/uL (ref 0.0–0.5)
Eosinophils Relative: 3 %
HCT: 38.3 % (ref 36.0–46.0)
Hemoglobin: 12.8 g/dL (ref 12.0–15.0)
Immature Granulocytes: 1 %
Lymphocytes Relative: 9 %
Lymphs Abs: 0.7 10*3/uL (ref 0.7–4.0)
MCH: 32 pg (ref 26.0–34.0)
MCHC: 33.4 g/dL (ref 30.0–36.0)
MCV: 95.8 fL (ref 80.0–100.0)
Monocytes Absolute: 1.2 10*3/uL — ABNORMAL HIGH (ref 0.1–1.0)
Monocytes Relative: 14 %
Neutro Abs: 5.9 10*3/uL (ref 1.7–7.7)
Neutrophils Relative %: 71 %
Platelet Count: 349 10*3/uL (ref 150–400)
RBC: 4 MIL/uL (ref 3.87–5.11)
RDW: 16.1 % — ABNORMAL HIGH (ref 11.5–15.5)
WBC Count: 8.2 10*3/uL (ref 4.0–10.5)
nRBC: 0 % (ref 0.0–0.2)

## 2023-02-21 LAB — CMP (CANCER CENTER ONLY)
ALT: 7 U/L (ref 0–44)
AST: 14 U/L — ABNORMAL LOW (ref 15–41)
Albumin: 4.1 g/dL (ref 3.5–5.0)
Alkaline Phosphatase: 122 U/L (ref 38–126)
Anion gap: 9 (ref 5–15)
BUN: 23 mg/dL (ref 8–23)
CO2: 22 mmol/L (ref 22–32)
Calcium: 10.1 mg/dL (ref 8.9–10.3)
Chloride: 108 mmol/L (ref 98–111)
Creatinine: 1.07 mg/dL — ABNORMAL HIGH (ref 0.44–1.00)
GFR, Estimated: 55 mL/min — ABNORMAL LOW (ref >60)
Glucose, Bld: 92 mg/dL (ref 70–99)
Potassium: 4.1 mmol/L (ref 3.5–5.1)
Sodium: 139 mmol/L (ref 135–145)
Total Bilirubin: 0.3 mg/dL (ref 0.3–1.2)
Total Protein: 7.2 g/dL (ref 6.5–8.1)

## 2023-02-21 MED ORDER — DIPHENHYDRAMINE HCL 25 MG PO CAPS
50.0000 mg | ORAL_CAPSULE | Freq: Once | ORAL | Status: AC
  Administered 2023-02-21: 50 mg via ORAL
  Filled 2023-02-21: qty 2

## 2023-02-21 MED ORDER — SODIUM CHLORIDE 0.9% FLUSH
10.0000 mL | Freq: Once | INTRAVENOUS | Status: AC
  Administered 2023-02-21: 10 mL

## 2023-02-21 MED ORDER — PALONOSETRON HCL INJECTION 0.25 MG/5ML
0.2500 mg | Freq: Once | INTRAVENOUS | Status: AC
  Administered 2023-02-21: 0.25 mg via INTRAVENOUS
  Filled 2023-02-21: qty 5

## 2023-02-21 MED ORDER — SODIUM CHLORIDE 0.9% FLUSH
10.0000 mL | INTRAVENOUS | Status: DC | PRN
  Administered 2023-02-21: 10 mL

## 2023-02-21 MED ORDER — DEXTROSE 5 % IV SOLN: Freq: Once | INTRAVENOUS | Status: AC

## 2023-02-21 MED ORDER — SODIUM CHLORIDE 0.9 % IV SOLN
150.0000 mg | Freq: Once | INTRAVENOUS | Status: AC
  Administered 2023-02-21: 150 mg via INTRAVENOUS
  Filled 2023-02-21: qty 150

## 2023-02-21 MED ORDER — FAM-TRASTUZUMAB DERUXTECAN-NXKI CHEMO 100 MG IV SOLR
3.2000 mg/kg | Freq: Once | INTRAVENOUS | Status: AC
  Administered 2023-02-21: 200 mg via INTRAVENOUS
  Filled 2023-02-21: qty 10

## 2023-02-21 MED ORDER — SODIUM CHLORIDE 0.9 % IV SOLN
10.0000 mg | Freq: Once | INTRAVENOUS | Status: AC
  Administered 2023-02-21: 10 mg via INTRAVENOUS
  Filled 2023-02-21: qty 10

## 2023-02-21 MED ORDER — ACETAMINOPHEN 325 MG PO TABS
650.0000 mg | ORAL_TABLET | Freq: Once | ORAL | Status: AC
  Administered 2023-02-21: 650 mg via ORAL
  Filled 2023-02-21: qty 2

## 2023-02-21 MED ORDER — HEPARIN SOD (PORK) LOCK FLUSH 100 UNIT/ML IV SOLN
500.0000 [IU] | Freq: Once | INTRAVENOUS | Status: AC | PRN
  Administered 2023-02-21: 500 [IU]

## 2023-02-21 NOTE — Assessment & Plan Note (Addendum)
Metastatic breast cancer (pericardial effusion, pleural effusion, right middle lobe lung lesion, ER 91%, PR 90%, Ki-67 35%, HER2 negative diagnosed February 2023)   (Prior history of right mastectomy 2005, adjuvant chemo with Adriamycin Cytoxan and Taxol followed by tamoxifen)   Current treatment:  1. Ribociclib, fulvestrant, Xgeva, ribociclib discontinued 10/18/2021  2. abemaciclib started 10/27/2021 Foundation 1 in 2021: MSI stable, TMB 1, ESR 1 mutation, PTEN, FGF 10, RICTOR amplifications   Guardant360 01/14/2022: No actionable mutations.  ESR 1 mutation. Liver biopsy 02/26/2022: Metastatic carcinoma consistent with breast primary. Caris mutation testing 02/26/2022: HER2 low, ESR 1 mutation, PTEN   Treatment plan: Enhertu every 3 weeks, today cycle 15 She has a puppy named Ricky Kizzie Ide Tzu 7 months old).   Enhertu toxicities: Fatigue that lasted for 4 to 5 days: We reduced the dosage of her chemotherapy with cycle 8 Nausea and vomiting: Much better controlled Intermittent diarrhea: Improved with Imodium Fatigue and loss of taste and appetite: Last for 1 week: Hopefully she will do better with lower dosage.   08/30/2022: CT chest: Stable moderate right pleural effusion with associated near complete collapse right lower lobe.  Unchanged bone metastases. 11/26/2022: CT CAP: No significant interval change.  Stable right-sided pleural effusion 5 mm lung nodule stable bone mets CA 27-29: 143 (was 695 on 05/23/2022)    return to clinic every 3 weeks for Enhertu and scans

## 2023-02-23 ENCOUNTER — Other Ambulatory Visit: Payer: Self-pay

## 2023-02-24 ENCOUNTER — Telehealth: Payer: Self-pay | Admitting: Hematology and Oncology

## 2023-02-24 NOTE — Telephone Encounter (Signed)
Scheduled appointments per WQ. Patient is aware of the made appointments.  

## 2023-02-25 ENCOUNTER — Other Ambulatory Visit: Payer: Self-pay

## 2023-02-28 ENCOUNTER — Other Ambulatory Visit: Payer: Self-pay | Admitting: Hematology and Oncology

## 2023-03-03 ENCOUNTER — Ambulatory Visit (HOSPITAL_COMMUNITY)
Admission: RE | Admit: 2023-03-03 | Discharge: 2023-03-03 | Disposition: A | Discharge status: Home / Self Care | Payer: Medicare Other | Source: Ambulatory Visit | Attending: Adult Health | Admitting: Adult Health

## 2023-03-03 DIAGNOSIS — C787 Secondary malignant neoplasm of liver and intrahepatic bile duct: Secondary | ICD-10-CM | POA: Insufficient documentation

## 2023-03-03 DIAGNOSIS — C50911 Malignant neoplasm of unspecified site of right female breast: Secondary | ICD-10-CM | POA: Diagnosis not present

## 2023-03-03 DIAGNOSIS — Z170 Estrogen receptor positive status [ER+]: Secondary | ICD-10-CM | POA: Diagnosis not present

## 2023-03-03 DIAGNOSIS — C50919 Malignant neoplasm of unspecified site of unspecified female breast: Secondary | ICD-10-CM | POA: Diagnosis not present

## 2023-03-03 DIAGNOSIS — C7951 Secondary malignant neoplasm of bone: Secondary | ICD-10-CM | POA: Diagnosis not present

## 2023-03-03 DIAGNOSIS — R918 Other nonspecific abnormal finding of lung field: Secondary | ICD-10-CM | POA: Diagnosis not present

## 2023-03-03 DIAGNOSIS — C50411 Malignant neoplasm of upper-outer quadrant of right female breast: Secondary | ICD-10-CM | POA: Diagnosis not present

## 2023-03-03 DIAGNOSIS — D3502 Benign neoplasm of left adrenal gland: Secondary | ICD-10-CM | POA: Diagnosis not present

## 2023-03-03 DIAGNOSIS — C7800 Secondary malignant neoplasm of unspecified lung: Secondary | ICD-10-CM | POA: Insufficient documentation

## 2023-03-03 DIAGNOSIS — J90 Pleural effusion, not elsewhere classified: Secondary | ICD-10-CM | POA: Diagnosis not present

## 2023-03-03 IMAGING — MR MR ABDOMEN WO/W CM
18 series · 48 of 48 positions shown · IV contrast (7ml GADAVIST)
Comparison: 05/30/2021

CLINICAL DATA: Metastatic breast cancer, assess treatment response

EXAM:
MRI ABDOMEN WITHOUT AND WITH CONTRAST
TECHNIQUE: Multiplanar multisequence MR imaging of the abdomen was performed
both before and after the administration of intravenous contrast.
CONTRAST:  7mL GADAVIST GADOBUTROL 1 MMOL/ML IV SOLN

[Series 2: DWI · axial · 6.0mm · 1.49mm/px · z∈[-169,+83]mm · 4 of 72 slices shown (1 of 2)]
[im 1/72]
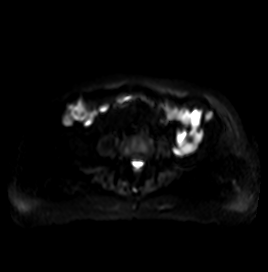
[im 24/72]
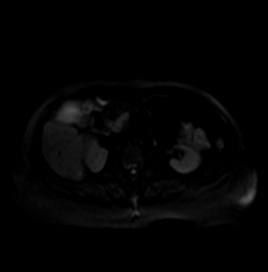
[im 48/72]
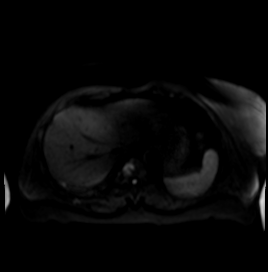
[im 72/72]
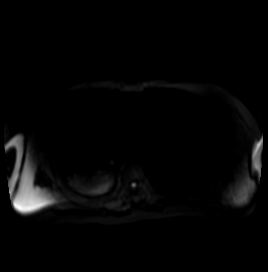

[Series 3: DWI · axial · 6.0mm · 1.49mm/px · z∈[-169,+83]mm · 2 of 36 slices shown (2 of 2)]
[im 1/36]
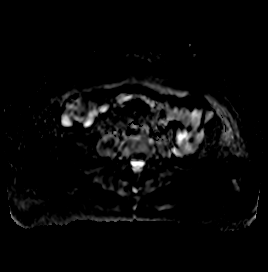
[im 36/36]
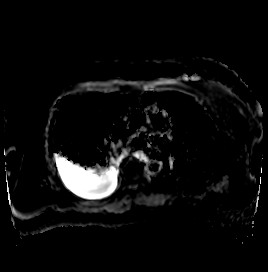

[Series 4: T2 fat-sat · axial · 6.0mm · 1.25mm/px · z∈[-162,+90]mm · 2 of 36 slices shown]
[im 1/36]
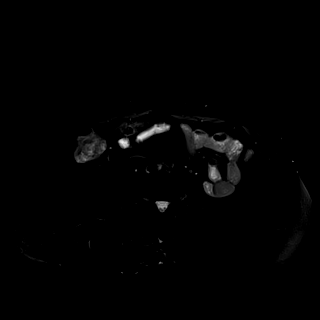
[im 36/36]
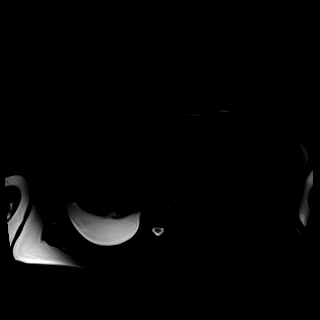

[Series 7: T2 · coronal · 6.0mm · 1.56mm/px · 2 of 33 slices shown (1 of 2)]
[im 1/33]
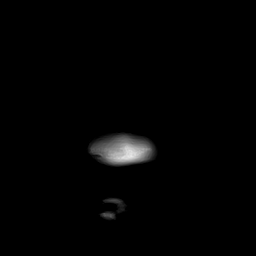
[im 33/33]
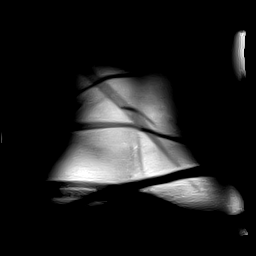

[Series 10: bSSFP · axial · 4.0mm · 0.84mm/px · z∈[-166,+86]mm · 2 of 64 slices shown]
[im 1/64]
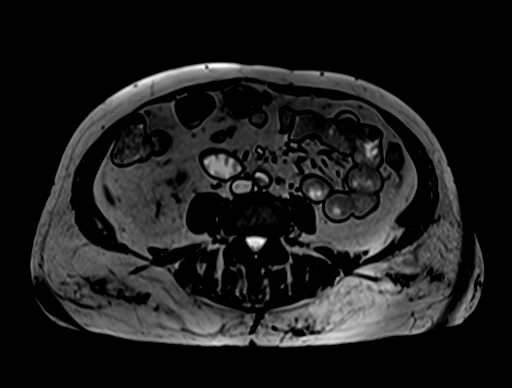
[im 64/64]
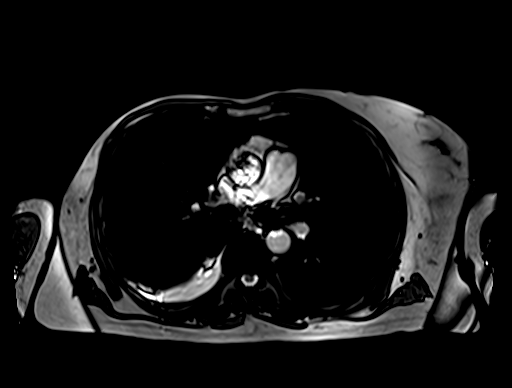

[Series 12: T1 dynamic · axial · 3.0mm · 1.25mm/px · z∈[-205,+56]mm · 3 of 88 slices shown (1 of 10)]
[im 1/88]
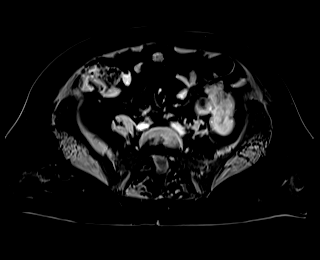
[im 44/88]
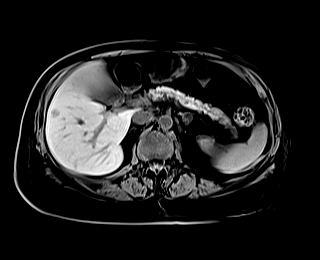
[im 88/88]
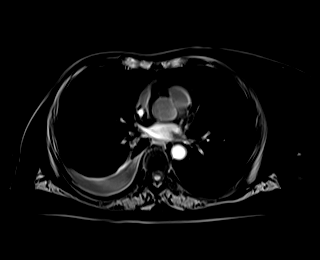

[Series 13: T1 · axial · 3.4mm · 1.25mm/px · z∈[-203,+66]mm · 3 of 80 slices shown (1 of 2)]
[im 1/80]
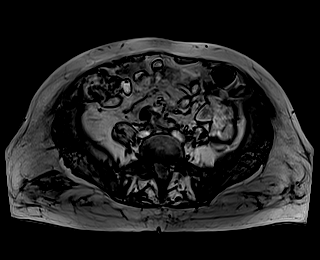
[im 40/80]
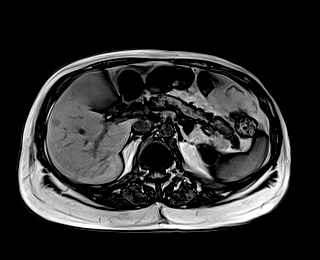
[im 80/80]
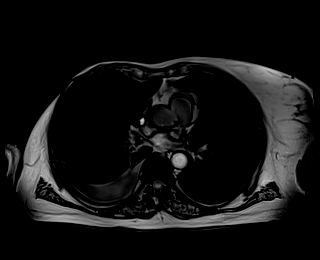

[Series 14: T1 · axial · 3.4mm · 1.25mm/px · z∈[-203,+66]mm · 3 of 80 slices shown (2 of 2)]
[im 1/80]
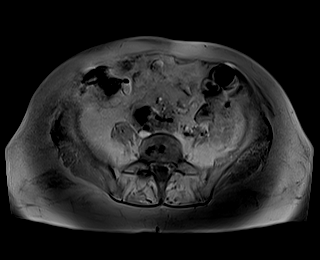
[im 40/80]
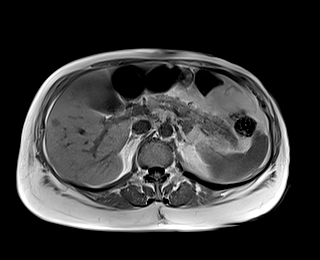
[im 80/80]
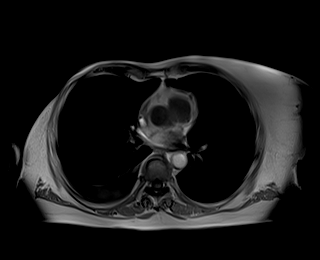

[Series 18: T1 dynamic · axial · 3.0mm · 1.25mm/px · z∈[-205,+56]mm · 3 of 88 slices shown (2 of 10)]
[im 1/88]
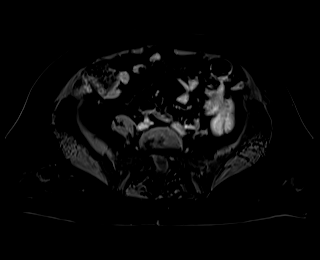
[im 44/88]
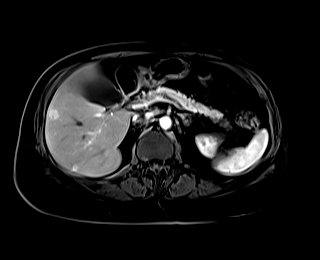
[im 88/88]
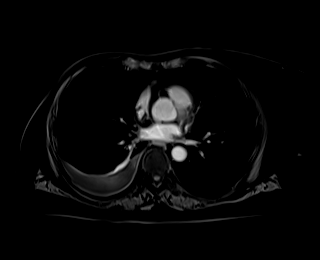

[Series 19: T1 dynamic · axial · 3.0mm · 1.25mm/px · z∈[-205,+56]mm · 3 of 88 slices shown (3 of 10)]
[im 1/88]
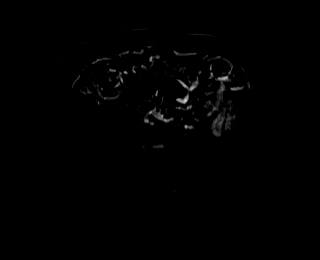
[im 44/88]
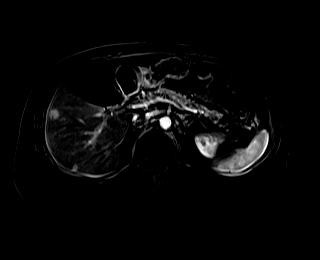
[im 88/88]
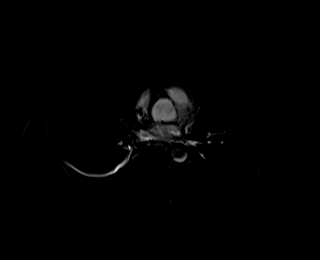

[Series 22: T1 dynamic · axial · 3.0mm · 1.25mm/px · z∈[-205,+56]mm · 3 of 88 slices shown (4 of 10)]
[im 1/88]
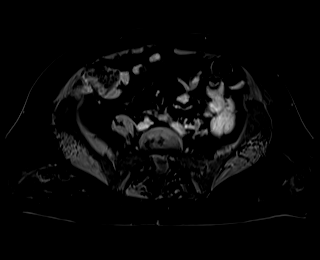
[im 44/88]
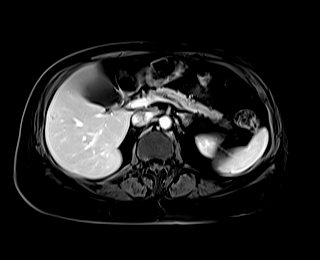
[im 88/88]
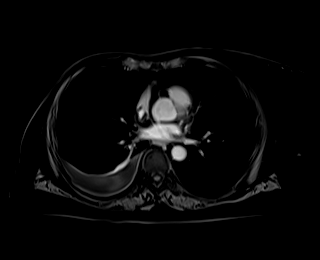

[Series 23: T1 dynamic · axial · 3.0mm · 1.25mm/px · z∈[-205,+56]mm · 3 of 88 slices shown (5 of 10)]
[im 1/88]
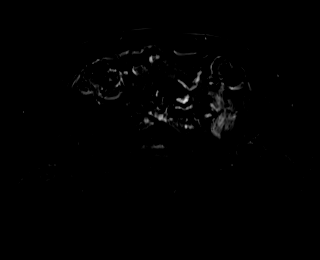
[im 44/88]
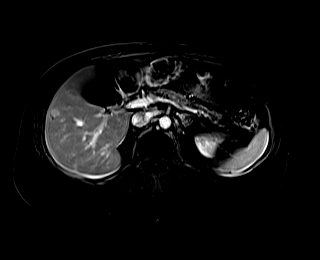
[im 88/88]
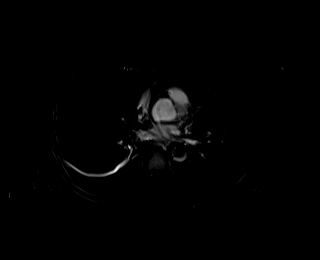

[Series 26: T1 dynamic · axial · 3.0mm · 1.25mm/px · z∈[-205,+56]mm · 3 of 88 slices shown (6 of 10)]
[im 1/88]
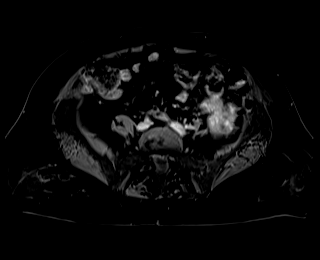
[im 44/88]
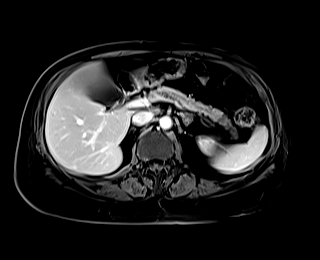
[im 88/88]
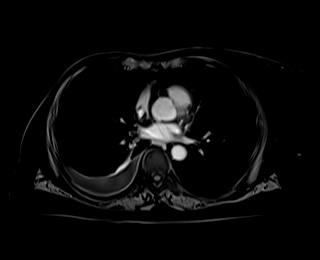

[Series 27: T1 dynamic · axial · 3.0mm · 1.25mm/px · z∈[-205,+56]mm · 3 of 88 slices shown (7 of 10)]
[im 1/88]
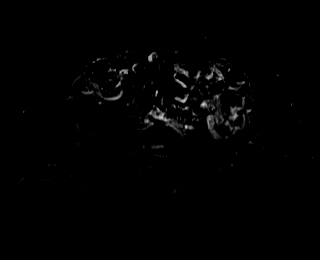
[im 44/88]
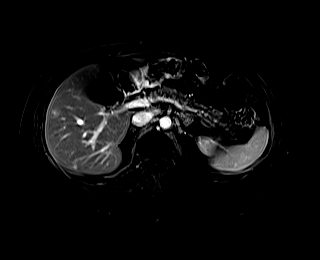
[im 88/88]
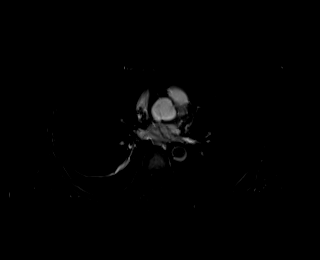

[Series 29: T1 dynamic · coronal · 4.5mm · 1.41mm/px · 2 of 60 slices shown (8 of 10)]
[im 1/60]
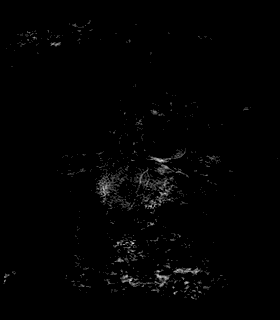
[im 60/60]
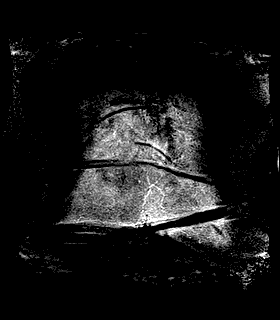

[Series 30: T2 · axial · 6.0mm · 1.56mm/px · 1 of 40 slices shown (2 of 2)]
[im 1/40]
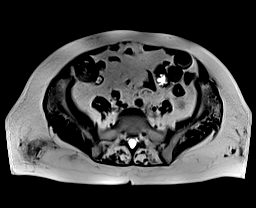

[Series 33: T1 dynamic · axial · 3.0mm · 1.25mm/px · z∈[-205,+56]mm · 3 of 88 slices shown (9 of 10)]
[im 1/88]
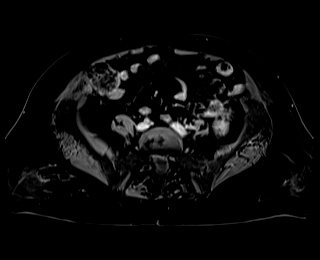
[im 44/88]
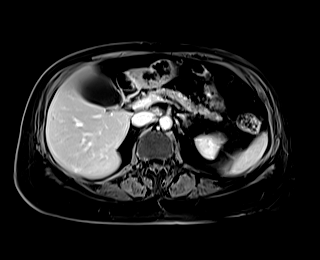
[im 88/88]
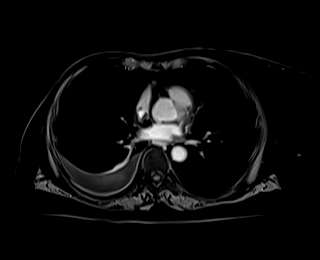

[Series 34: T1 dynamic · axial · 3.0mm · 1.25mm/px · z∈[-205,+56]mm · 3 of 88 slices shown (10 of 10)]
[im 1/88]
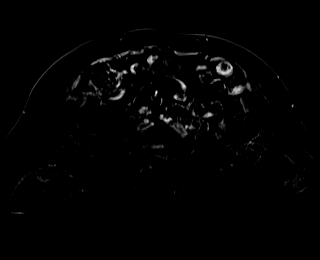
[im 44/88]
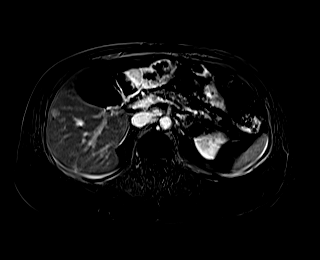
[im 88/88]
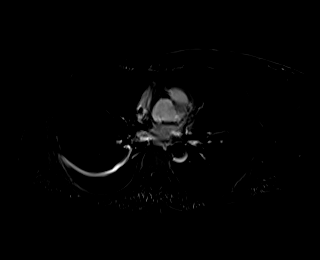

[48 of 48 positions shown; findings below may reference images not displayed]

FINDINGS: Lower chest: Unchanged, moderate right pleural effusion.

Hepatobiliary: Multiple, intrinsically T2 hyperintense, generally
rim enhancing liver lesions are stable to minimally enlarged. Index
lesion of the left lobe of the liver, hepatic segment III, measures
1.6 x 1.4 cm, previously 1.4 x 1.2 cm (series 4, image 17).
Additional adjacent lesion measures 1.2 x 1.2 cm, previously 1.0 x
1.0 cm (series 4, image 17). Index lesion of the peripheral right
lobe of the liver is unchanged, measuring 1.1 x 1.1 cm (series 4,
image 19). No gallstones. No biliary ductal dilatation.

Pancreas: No mass, inflammatory changes, or other parenchymal
abnormality identified.No pancreatic ductal dilatation.

Spleen:  Within normal limits in size and appearance.

Adrenals/Urinary Tract: Stable, benign left adrenal adenoma. No
renal masses or suspicious contrast enhancement identified. No
evidence of hydronephrosis.

Stomach/Bowel: Visualized portions within the abdomen are
unremarkable.

Vascular/Lymphatic: No pathologically enlarged lymph nodes
identified. No abdominal aortic aneurysm demonstrated.

Other:  None.

Musculoskeletal: Contrast enhancing osseous metastases of T10, T11
(series 29, image 39), L2, and L3 are unchanged (series 29, image
33).
IMPRESSION: 1. Multiple, liver lesions are stable to minimally enlarged,
consistent with slightly worsened hepatic metastatic disease.
2. Unchanged osseous metastases of the included thoracic and lumbar
spine.
3. Unchanged moderate right pleural effusion.

## 2023-03-03 MED ORDER — IOHEXOL 300 MG/ML  SOLN
75.0000 mL | Freq: Once | INTRAMUSCULAR | Status: AC | PRN
  Administered 2023-03-03: 75 mL via INTRAVENOUS

## 2023-03-03 MED ORDER — SODIUM CHLORIDE (PF) 0.9 % IJ SOLN
INTRAMUSCULAR | Status: AC
  Filled 2023-03-03: qty 50

## 2023-03-03 MED ORDER — GADOBUTROL 1 MMOL/ML IV SOLN
6.0000 mL | Freq: Once | INTRAVENOUS | Status: AC | PRN
  Administered 2023-03-03: 6 mL via INTRAVENOUS

## 2023-03-13 MED FILL — Fosaprepitant Dimeglumine For IV Infusion 150 MG (Base Eq): INTRAVENOUS | Qty: 5 | Status: AC

## 2023-03-13 MED FILL — Dexamethasone Sodium Phosphate Inj 100 MG/10ML: INTRAMUSCULAR | Qty: 1 | Status: AC

## 2023-03-14 ENCOUNTER — Other Ambulatory Visit: Payer: Self-pay

## 2023-03-14 ENCOUNTER — Inpatient Hospital Stay: Payer: Medicare Other | Attending: Oncology | Admitting: Hematology and Oncology

## 2023-03-14 ENCOUNTER — Inpatient Hospital Stay: Payer: Medicare Other

## 2023-03-14 VITALS — BP 102/63 | HR 81 | Resp 16

## 2023-03-14 VITALS — BP 100/62 | HR 107 | Temp 97.2°F | Resp 18 | Ht 63.5 in | Wt 140.6 lb

## 2023-03-14 DIAGNOSIS — C7951 Secondary malignant neoplasm of bone: Secondary | ICD-10-CM | POA: Diagnosis not present

## 2023-03-14 DIAGNOSIS — C50911 Malignant neoplasm of unspecified site of right female breast: Secondary | ICD-10-CM

## 2023-03-14 DIAGNOSIS — C7800 Secondary malignant neoplasm of unspecified lung: Secondary | ICD-10-CM | POA: Insufficient documentation

## 2023-03-14 DIAGNOSIS — C50411 Malignant neoplasm of upper-outer quadrant of right female breast: Secondary | ICD-10-CM | POA: Diagnosis not present

## 2023-03-14 DIAGNOSIS — Z95828 Presence of other vascular implants and grafts: Secondary | ICD-10-CM

## 2023-03-14 DIAGNOSIS — Z5112 Encounter for antineoplastic immunotherapy: Secondary | ICD-10-CM | POA: Insufficient documentation

## 2023-03-14 DIAGNOSIS — Z170 Estrogen receptor positive status [ER+]: Secondary | ICD-10-CM

## 2023-03-14 DIAGNOSIS — C787 Secondary malignant neoplasm of liver and intrahepatic bile duct: Secondary | ICD-10-CM | POA: Insufficient documentation

## 2023-03-14 DIAGNOSIS — Z79899 Other long term (current) drug therapy: Secondary | ICD-10-CM | POA: Diagnosis not present

## 2023-03-14 LAB — CBC WITH DIFFERENTIAL (CANCER CENTER ONLY)
Abs Immature Granulocytes: 0.06 10*3/uL (ref 0.00–0.07)
Basophils Absolute: 0.1 10*3/uL (ref 0.0–0.1)
Basophils Relative: 1 %
Eosinophils Absolute: 0.2 10*3/uL (ref 0.0–0.5)
Eosinophils Relative: 3 %
HCT: 39.2 % (ref 36.0–46.0)
Hemoglobin: 12.8 g/dL (ref 12.0–15.0)
Immature Granulocytes: 1 %
Lymphocytes Relative: 9 %
Lymphs Abs: 0.7 10*3/uL (ref 0.7–4.0)
MCH: 31.8 pg (ref 26.0–34.0)
MCHC: 32.7 g/dL (ref 30.0–36.0)
MCV: 97.3 fL (ref 80.0–100.0)
Monocytes Absolute: 1.2 10*3/uL — ABNORMAL HIGH (ref 0.1–1.0)
Monocytes Relative: 16 %
Neutro Abs: 5.2 10*3/uL (ref 1.7–7.7)
Neutrophils Relative %: 70 %
Platelet Count: 341 10*3/uL (ref 150–400)
RBC: 4.03 MIL/uL (ref 3.87–5.11)
RDW: 16.1 % — ABNORMAL HIGH (ref 11.5–15.5)
WBC Count: 7.5 10*3/uL (ref 4.0–10.5)
nRBC: 0 % (ref 0.0–0.2)

## 2023-03-14 LAB — CMP (CANCER CENTER ONLY)
ALT: 6 U/L (ref 0–44)
AST: 13 U/L — ABNORMAL LOW (ref 15–41)
Albumin: 4.1 g/dL (ref 3.5–5.0)
Alkaline Phosphatase: 106 U/L (ref 38–126)
Anion gap: 8 (ref 5–15)
BUN: 21 mg/dL (ref 8–23)
CO2: 23 mmol/L (ref 22–32)
Calcium: 9.5 mg/dL (ref 8.9–10.3)
Chloride: 108 mmol/L (ref 98–111)
Creatinine: 1.04 mg/dL — ABNORMAL HIGH (ref 0.44–1.00)
GFR, Estimated: 57 mL/min — ABNORMAL LOW (ref >60)
Glucose, Bld: 112 mg/dL — ABNORMAL HIGH (ref 70–99)
Potassium: 3.7 mmol/L (ref 3.5–5.1)
Sodium: 139 mmol/L (ref 135–145)
Total Bilirubin: 0.3 mg/dL (ref 0.3–1.2)
Total Protein: 7.5 g/dL (ref 6.5–8.1)

## 2023-03-14 MED ORDER — FAM-TRASTUZUMAB DERUXTECAN-NXKI CHEMO 100 MG IV SOLR
3.2000 mg/kg | Freq: Once | INTRAVENOUS | Status: AC
  Administered 2023-03-14: 200 mg via INTRAVENOUS
  Filled 2023-03-14: qty 10

## 2023-03-14 MED ORDER — ACETAMINOPHEN 325 MG PO TABS
650.0000 mg | ORAL_TABLET | Freq: Once | ORAL | Status: AC
  Administered 2023-03-14: 650 mg via ORAL
  Filled 2023-03-14: qty 2

## 2023-03-14 MED ORDER — SODIUM CHLORIDE 0.9 % IV SOLN
10.0000 mg | Freq: Once | INTRAVENOUS | Status: AC
  Administered 2023-03-14: 10 mg via INTRAVENOUS
  Filled 2023-03-14: qty 10

## 2023-03-14 MED ORDER — PALONOSETRON HCL INJECTION 0.25 MG/5ML
0.2500 mg | Freq: Once | INTRAVENOUS | Status: AC
  Administered 2023-03-14: 0.25 mg via INTRAVENOUS
  Filled 2023-03-14: qty 5

## 2023-03-14 MED ORDER — DEXTROSE 5 % IV SOLN: Freq: Once | INTRAVENOUS | Status: AC

## 2023-03-14 MED ORDER — DIPHENHYDRAMINE HCL 25 MG PO CAPS
50.0000 mg | ORAL_CAPSULE | Freq: Once | ORAL | Status: AC
  Administered 2023-03-14: 50 mg via ORAL
  Filled 2023-03-14: qty 2

## 2023-03-14 MED ORDER — SODIUM CHLORIDE 0.9 % IV SOLN
150.0000 mg | Freq: Once | INTRAVENOUS | Status: AC
  Administered 2023-03-14: 150 mg via INTRAVENOUS
  Filled 2023-03-14: qty 150

## 2023-03-14 MED ORDER — HEPARIN SOD (PORK) LOCK FLUSH 100 UNIT/ML IV SOLN
500.0000 [IU] | Freq: Once | INTRAVENOUS | Status: AC | PRN
  Administered 2023-03-14: 500 [IU]

## 2023-03-14 MED ORDER — SODIUM CHLORIDE 0.9% FLUSH
10.0000 mL | INTRAVENOUS | Status: DC | PRN
  Administered 2023-03-14: 10 mL

## 2023-03-14 MED ORDER — RIVAROXABAN (XARELTO) VTE STARTER PACK (15 & 20 MG): ORAL_TABLET | ORAL | 0 refills | Status: DC

## 2023-03-14 MED ORDER — SODIUM CHLORIDE 0.9% FLUSH
10.0000 mL | Freq: Once | INTRAVENOUS | Status: AC
  Administered 2023-03-14: 10 mL

## 2023-03-14 NOTE — Progress Notes (Signed)
1050 Received order from Dr. [name] that it is okay to use pt's port-a-cath after he reviewed latest CT scan.

## 2023-03-14 NOTE — Progress Notes (Signed)
Patient Care Team: Barbara Patience, FNP as PCP - General (Family Medicine) Maisie Fus, MD as PCP - Cardiology (Cardiology) Jake Bathe, MD (Cardiology) Alleen Borne, MD (Cardiothoracic Surgery) Anselm Lis, RPH-CPP (Pharmacist) Nils Pyle, MD as Referring Physician (Radiation Oncology) Serena Croissant, MD as Consulting Physician (Hematology and Oncology)  DIAGNOSIS:  Encounter Diagnoses  Name Primary?   Malignant neoplasm of upper-outer quadrant of right breast in female, estrogen receptor positive (HCC) Yes   Carcinoma of right breast metastatic to lung (HCC)     SUMMARY OF ONCOLOGIC HISTORY: Oncology History  Carcinoma of right breast metastatic to lung (HCC)  10/10/2011 Initial Diagnosis   Carcinoma of right breast metastatic to lung (HCC)   05/02/2022 -  Chemotherapy   Patient is on Treatment Plan : BREAST METASTATIC Fam-Trastuzumab Deruxtecan-nxki (Enhertu) (5.4) q21d     Malignant neoplasm of upper-outer quadrant of right breast in female, estrogen receptor positive (HCC)  03/22/2004 Initial Diagnosis   right mastectomy and sentinel lymph node sampling 03/22/2004 for a right upper-outer quadrant pT2 pN1, stage IIB invasive ductal carcinoma with lobular features, grade 2, estrogen receptor and progesterone receptor positive, HER-2 negative, with an MIB-1 of 9%   05/2004 - 09/18/2004 Chemotherapy   adjuvant chemotherapy with dose dense doxorubicin and cyclophosphamide x4 cycles (first cycle delayed one week) followed by dose dense paclitaxel x4 was completed 09/18/2004   09/2004 - 2009 Anti-estrogen oral therapy   tamoxifen started March 2006, discontinued 2009   08/2011 Relapse/Recurrence   Metastatic disease:large pericardial effusion, large right pleural effusion and possible right middle lobe bronchial obstruction  status post pericardial window placement, fiberoptic bronchoscopy and right Pleurx placement 09/11/2011, with biopsy of the bronchus intermedius  and pericardium positive for metastatic breast cancer, ER 91%, PR 100% p with an MIB-1 of 35%, and no HER-2 amplification, ratio being 1.37   10/10/2011 - 07/2019 Anti-estrogen oral therapy   BOLERO-4 trial 10/10/2011, receiving letrozole and everolimus (stopped for progression)   08/16/2019 Miscellaneous   foundation 1 requested on liver biopsy from 08/16/2019 shows a stable microsatellite status with 1 mutation/MB, amplification of C11orf30, FGF10 and RICTOR, with mutations in ESR1 and PTEN   08/16/2019 Miscellaneous   Liver biopsy: Metastatic carcinoma to the liver consistent with breast primary, ER 100%, PR 100%, Ki-67 5%, HER2 2+ by IHC, FISH HER2 negative   08/24/2019 -  Anti-estrogen oral therapy   fulvestrant started 08/24/2019, repeated every 4 weeks Palbociclib 125 mg daily 21 days on 7 days off started 08/25/2019 Switched to ribociclib s starting 08/22/2020   02/28/2021 -  Radiation Therapy   palliative radiation (Dr. Langston Masker in [place]) 02/28/2021 to right hip area   10/27/2021 -  Anti-estrogen oral therapy   Abemaciclib with fulvestrant   01/14/2022 Miscellaneous   Guardant360: ESR 1 mutation (no other mutations) MSI high was not detected, PTEN, T p53, EGFR amplification, RB1 mutations detected without any approved treatment options     CHIEF COMPLIANT: Follow-up Metastatic carcinoma cycle 11 Enhertu/ Scans    INTERVAL HISTORY: Barbara Parks is a 72 y.o With Metastatic carcinoma cycle 9 Enhertu. She presents to the clinic for a follow-up. Patient reports treatment is going well.  She feels tired for a few days but then recovers from that.  She denies any nausea or vomiting.  Denies any diarrhea or constipation.   ALLERGIES:  is allergic to aspirin, latex, and nsaids.  MEDICATIONS:  Current Outpatient Medications  Medication Sig Dispense Refill   carvedilol (  COREG) 3.125 MG tablet Take 1 tablet (3.125 mg total) by mouth 2 (two) times daily with a meal. 60 tablet 2    ezetimibe (ZETIA) 10 MG tablet Take 1 tablet (10 mg total) by mouth daily. 90 tablet 3   fam-trastuzumab deruxtecan-nxki (ENHERTU) 100 MG SOLR as directed Intravenous     gemfibrozil (LOPID) 600 MG tablet TAKE 1 TABLET (600 MG TOTAL) BY MOUTH 2 (TWO) TIMES DAILY BEFORE A MEAL. 180 tablet 2   lidocaine-prilocaine (EMLA) cream Apply to affected area once 30 g 3   loperamide (IMODIUM) 2 MG capsule Take 2 mg by mouth 4 (four) times daily as needed for diarrhea or loose stools. Reported on 11/30/2015     metFORMIN (GLUCOPHAGE-XR) 500 MG 24 hr tablet Take 1 tablet (500 mg total) by mouth daily with supper. Followed by [facility]  3   omeprazole (PRILOSEC OTC) 20 MG tablet Take 20 mg by mouth daily as needed (acid reflux).     ondansetron (ZOFRAN) 8 MG tablet Take 1 tablet (8 mg total) by mouth every 8 (eight) hours as needed for nausea or vomiting. Start on the third day after chemotherapy. 30 tablet 1   prochlorperazine (COMPAZINE) 10 MG tablet Take 1 tablet (10 mg total) by mouth every 6 (six) hours as needed for nausea or vomiting. 30 tablet 1   RIVAROXABAN (XARELTO) VTE STARTER PACK (15 & 20 MG) Follow package directions: Take one 15mg  tablet by mouth twice a day. On day 22, switch to one 20mg  tablet once a day. Take with food. 51 each 0   triamcinolone (KENALOG) 0.025 % ointment Apply 1 Application topically 2 (two) times daily. 15 g 0   cholecalciferol (VITAMIN D3) 25 MCG (1000 UT) tablet Take 2 tablets (2,000 Units total) by mouth daily. (Patient not taking: Reported on 03/14/2023) 180 tablet 4   No current facility-administered medications for this visit.    PHYSICAL EXAMINATION: ECOG PERFORMANCE STATUS: 1 - Symptomatic but completely ambulatory  Vitals:   03/14/23 1111  BP: 100/62  Pulse: (!) 107  Resp: 18  Temp: (!) 97.2 F (36.2 C)  SpO2: 99%   Filed Weights   03/14/23 1111  Weight: 140 lb 9.6 oz (63.8 kg)      LABORATORY DATA:  I have reviewed the data as listed     Latest Ref Rng & Units 02/21/2023   11:04 AM 01/31/2023   10:09 AM 01/09/2023   12:55 PM  CMP  Glucose 70 - 99 mg/dL 92  960  454   BUN 8 - 23 mg/dL 23  15  20    Creatinine 0.44 - 1.00 mg/dL 0.98  1.19  1.47   Sodium 135 - 145 mmol/L 139  138  134   Potassium 3.5 - 5.1 mmol/L 4.1  3.6  3.9   Chloride 98 - 111 mmol/L 108  109  105   CO2 22 - 32 mmol/L 22  18  19    Calcium 8.9 - 10.3 mg/dL 82.9  9.5  9.2   Total Protein 6.5 - 8.1 g/dL 7.2  6.8  7.6   Total Bilirubin 0.3 - 1.2 mg/dL 0.3  0.3  0.2   Alkaline Phos 38 - 126 U/L 122  122  91   AST 15 - 41 U/L 14  15  35   ALT 0 - 44 U/L 7  9  18      Lab Results  Component Value Date   WBC 7.5 03/14/2023   HGB 12.8 03/14/2023  HCT 39.2 03/14/2023   MCV 97.3 03/14/2023   PLT 341 03/14/2023   NEUTROABS 5.2 03/14/2023    ASSESSMENT & PLAN:  Malignant neoplasm of upper-outer quadrant of right breast in female, estrogen receptor positive (HCC) Metastatic breast cancer (pericardial effusion, pleural effusion, right middle lobe lung lesion, ER 91%, PR 90%, Ki-67 35%, HER2 negative diagnosed February 2023)   (Prior history of right mastectomy 2005, adjuvant chemo with Adriamycin Cytoxan and Taxol followed by tamoxifen)   Current treatment:  1. Ribociclib, fulvestrant, Xgeva, ribociclib discontinued 10/18/2021  2. abemaciclib started 10/27/2021 Foundation 1 in 2021: MSI stable, TMB 1, ESR 1 mutation, PTEN, FGF 10, RICTOR amplifications   Guardant360 01/14/2022: No actionable mutations.  ESR 1 mutation. Liver biopsy 02/26/2022: Metastatic carcinoma consistent with breast primary. Caris mutation testing 02/26/2022: HER2 low, ESR 1 mutation, PTEN   Treatment plan: Enhertu every 3 weeks, today cycle 15 She has a puppy named Ricky Kizzie Ide Tzu 7 months old).   Enhertu toxicities: Fatigue that lasted for 4 to 5 days: We reduced the dosage of her chemotherapy with cycle 8 Nausea and vomiting: Much better controlled Intermittent diarrhea: Improved with  Imodium Fatigue and loss of taste and appetite: Last for 1 week: Hopefully she will do better with lower dosage.   08/30/2022: CT chest: Stable moderate right pleural effusion with associated near complete collapse right lower lobe.  Unchanged bone metastases. 11/26/2022: CT CAP: No significant interval change.  Stable right-sided pleural effusion 5 mm lung nodule stable bone mets CA 27-29: 143 (was 695 on 05/23/2022)  03/06/2023: CT chest: Stable, small pleural effusion, stable tiny lung nodules, stable bone metastasis 03/06/2023: Liver MRI: Multiple hepatic metastases overall decreased in size  Radiology review: Given the excellent results on the CT scan and the liver MRI we will continue with the same treatment.  Chronic for him was along the brachiocephalic vein around the port: We did not notice any such report before therefore we will send a prescription for Xarelto and treat her for at least 3 months and see what happens to the blood clot.  Overall it is unchanged from December 2023.    No orders of the defined types were placed in this encounter.  The patient has a good understanding of the overall plan. she agrees with it. she will call with any problems that may develop before the next visit here. Total time spent: 30 mins including face to face time and time spent for planning, charting and co-ordination of care   Tamsen Meek, MD 03/14/23    sacroiliac

## 2023-03-14 NOTE — Patient Instructions (Signed)
[facility]  Discharge Instructions: Thank you for choosing [facility] to provide your oncology and hematology care.   If you have a lab appointment with the Cancer Center, please go directly to the Cancer Center and check in at the registration area.   Wear comfortable clothing and clothing appropriate for easy access to any Portacath or PICC line.   We strive to give you quality time with your provider. You may need to reschedule your appointment if you arrive late (15 or more minutes).  Arriving late affects you and other patients whose appointments are after yours.  Also, if you miss three or more appointments without notifying the office, you may be dismissed from the clinic at the provider's discretion.      For prescription refill requests, have your pharmacy contact our office and allow 72 hours for refills to be completed.    Today you received the following chemotherapy and/or immunotherapy agents: fam-trastuzumab dertuxtecan-nxki      To help prevent nausea and vomiting after your treatment, we encourage you to take your nausea medication as directed.  BELOW ARE SYMPTOMS THAT SHOULD BE REPORTED IMMEDIATELY: *FEVER GREATER THAN 100.4 F (38 C) OR HIGHER *CHILLS OR SWEATING *NAUSEA AND VOMITING THAT IS NOT CONTROLLED WITH YOUR NAUSEA MEDICATION *UNUSUAL SHORTNESS OF BREATH *UNUSUAL BRUISING OR BLEEDING *URINARY PROBLEMS (pain or burning when urinating, or frequent urination) *BOWEL PROBLEMS (unusual diarrhea, constipation, pain near the anus) TENDERNESS IN MOUTH AND THROAT WITH OR WITHOUT PRESENCE OF ULCERS (sore throat, sores in mouth, or a toothache) UNUSUAL RASH, SWELLING OR PAIN  UNUSUAL VAGINAL DISCHARGE OR ITCHING   Items with * indicate a potential emergency and should be followed up as soon as possible or go to the Emergency Department if any problems should occur.  Please show the CHEMOTHERAPY ALERT CARD or  IMMUNOTHERAPY ALERT CARD at check-in to the Emergency Department and triage nurse.  Should you have questions after your visit or need to cancel or reschedule your appointment, please contact [facility]  Dept: [phone]  and follow the prompts.  Office hours are 8:00 a.m. to 4:30 p.m. Monday - Friday. Please note that voicemails left after 4:00 p.m. may not be returned until the following business day.  We are closed weekends and major holidays. You have access to a nurse at all times for urgent questions. Please call the main number to the clinic Dept: [phone] and follow the prompts.   For any non-urgent questions, you may also contact your provider using MyChart. We now offer e-Visits for anyone 18 and older to request care online for non-urgent symptoms. For details visit [email or web address].   Also download the MyChart app! Go to the app store, search "MyChart", open the app, select [facility], and log in with your MyChart username and password.   

## 2023-03-14 NOTE — Assessment & Plan Note (Addendum)
Metastatic breast cancer (pericardial effusion, pleural effusion, right middle lobe lung lesion, ER 91%, PR 90%, Ki-67 35%, HER2 negative diagnosed February 2023)   (Prior history of right mastectomy 2005, adjuvant chemo with Adriamycin Cytoxan and Taxol followed by tamoxifen)   Current treatment:  1. Ribociclib, fulvestrant, Xgeva, ribociclib discontinued 10/18/2021  2. abemaciclib started 10/27/2021 Foundation 1 in 2021: MSI stable, TMB 1, ESR 1 mutation, PTEN, FGF 10, RICTOR amplifications   Guardant360 01/14/2022: No actionable mutations.  ESR 1 mutation. Liver biopsy 02/26/2022: Metastatic carcinoma consistent with breast primary. Caris mutation testing 02/26/2022: HER2 low, ESR 1 mutation, PTEN   Treatment plan: Enhertu every 3 weeks, today cycle 15 She has a puppy named Ricky Kizzie Ide Tzu 7 months old).   Enhertu toxicities: Fatigue that lasted for 4 to 5 days: We reduced the dosage of her chemotherapy with cycle 8 Nausea and vomiting: Much better controlled Intermittent diarrhea: Improved with Imodium Fatigue and loss of taste and appetite: Last for 1 week: Hopefully she will do better with lower dosage.   08/30/2022: CT chest: Stable moderate right pleural effusion with associated near complete collapse right lower lobe.  Unchanged bone metastases. 11/26/2022: CT CAP: No significant interval change.  Stable right-sided pleural effusion 5 mm lung nodule stable bone mets CA 27-29: 143 (was 695 on 05/23/2022)  03/06/2023: CT chest: Stable, small pleural effusion, stable tiny lung nodules, stable bone metastasis 03/06/2023: Liver MRI: Multiple hepatic metastases overall decreased in size  Radiology review: Given the excellent results on the CT scan and the liver MRI we will continue with the same treatment.  Chronic for him was along the brachiocephalic vein around the port: We did not notice any such report before therefore we will send a prescription for Xarelto and treat her for at least 3 months  and see what happens to the blood clot.  Overall it is unchanged from December 2023.

## 2023-03-24 ENCOUNTER — Other Ambulatory Visit: Payer: Self-pay | Admitting: Hematology and Oncology

## 2023-03-24 MED ORDER — WARFARIN SODIUM 5 MG PO TABS: 5.0000 mg | ORAL_TABLET | Freq: Every day | ORAL | 0 refills | Status: DC

## 2023-03-24 NOTE — Progress Notes (Signed)
Pt declined picking up Xarelto medication for recent embolism found in brachiocephalic. Advised pt of Dr.Gudena recommending Lovenox injections x3 days BID and 5 mg coumadin. Pt declined Lovenox injections stating that she can not give herself the shot and she has no one to give it to her beiong that she lives alone. Educated pt on the Lovenox injections and why they are used in correlation with the coumadin. Pt repeatedly declined. Provider made aware. Rx for coumadin will be sent to pt pharmacy.

## 2023-04-01 ENCOUNTER — Ambulatory Visit: Payer: Medicare Other | Admitting: Internal Medicine

## 2023-04-03 MED FILL — Dexamethasone Sodium Phosphate Inj 100 MG/10ML: INTRAMUSCULAR | Qty: 1 | Status: AC

## 2023-04-03 MED FILL — Fosaprepitant Dimeglumine For IV Infusion 150 MG (Base Eq): INTRAVENOUS | Qty: 5 | Status: AC

## 2023-04-04 ENCOUNTER — Inpatient Hospital Stay: Payer: Medicare Other

## 2023-04-04 ENCOUNTER — Inpatient Hospital Stay (HOSPITAL_BASED_OUTPATIENT_CLINIC_OR_DEPARTMENT_OTHER): Payer: Medicare Other | Admitting: Hematology and Oncology

## 2023-04-04 ENCOUNTER — Inpatient Hospital Stay: Payer: Medicare Other | Attending: Oncology

## 2023-04-04 VITALS — BP 114/65 | HR 101 | Temp 97.9°F | Resp 18 | Ht 63.5 in | Wt 139.8 lb

## 2023-04-04 VITALS — HR 96

## 2023-04-04 DIAGNOSIS — C7800 Secondary malignant neoplasm of unspecified lung: Secondary | ICD-10-CM

## 2023-04-04 DIAGNOSIS — R978 Other abnormal tumor markers: Secondary | ICD-10-CM

## 2023-04-04 DIAGNOSIS — C7951 Secondary malignant neoplasm of bone: Secondary | ICD-10-CM

## 2023-04-04 DIAGNOSIS — Z79899 Other long term (current) drug therapy: Secondary | ICD-10-CM | POA: Diagnosis not present

## 2023-04-04 DIAGNOSIS — C50411 Malignant neoplasm of upper-outer quadrant of right female breast: Secondary | ICD-10-CM | POA: Insufficient documentation

## 2023-04-04 DIAGNOSIS — Z5112 Encounter for antineoplastic immunotherapy: Secondary | ICD-10-CM | POA: Insufficient documentation

## 2023-04-04 DIAGNOSIS — C7801 Secondary malignant neoplasm of right lung: Secondary | ICD-10-CM | POA: Diagnosis not present

## 2023-04-04 DIAGNOSIS — C50911 Malignant neoplasm of unspecified site of right female breast: Secondary | ICD-10-CM

## 2023-04-04 DIAGNOSIS — C787 Secondary malignant neoplasm of liver and intrahepatic bile duct: Secondary | ICD-10-CM | POA: Insufficient documentation

## 2023-04-04 LAB — CMP (CANCER CENTER ONLY)
ALT: 7 U/L (ref 0–44)
AST: 16 U/L (ref 15–41)
Albumin: 4.1 g/dL (ref 3.5–5.0)
Alkaline Phosphatase: 120 U/L (ref 38–126)
Anion gap: 9 (ref 5–15)
BUN: 16 mg/dL (ref 8–23)
CO2: 24 mmol/L (ref 22–32)
Calcium: 9.8 mg/dL (ref 8.9–10.3)
Chloride: 106 mmol/L (ref 98–111)
Creatinine: 1.11 mg/dL — ABNORMAL HIGH (ref 0.44–1.00)
GFR, Estimated: 53 mL/min — ABNORMAL LOW (ref >60)
Glucose, Bld: 85 mg/dL (ref 70–99)
Potassium: 3.9 mmol/L (ref 3.5–5.1)
Sodium: 139 mmol/L (ref 135–145)
Total Bilirubin: 0.3 mg/dL (ref 0.3–1.2)
Total Protein: 7.5 g/dL (ref 6.5–8.1)

## 2023-04-04 LAB — CBC WITH DIFFERENTIAL (CANCER CENTER ONLY)
Abs Immature Granulocytes: 0.08 10*3/uL — ABNORMAL HIGH (ref 0.00–0.07)
Basophils Absolute: 0.1 10*3/uL (ref 0.0–0.1)
Basophils Relative: 1 %
Eosinophils Absolute: 0.3 10*3/uL (ref 0.0–0.5)
Eosinophils Relative: 4 %
HCT: 38 % (ref 36.0–46.0)
Hemoglobin: 12.5 g/dL (ref 12.0–15.0)
Immature Granulocytes: 1 %
Lymphocytes Relative: 6 %
Lymphs Abs: 0.5 10*3/uL — ABNORMAL LOW (ref 0.7–4.0)
MCH: 31.3 pg (ref 26.0–34.0)
MCHC: 32.9 g/dL (ref 30.0–36.0)
MCV: 95.2 fL (ref 80.0–100.0)
Monocytes Absolute: 1.7 10*3/uL — ABNORMAL HIGH (ref 0.1–1.0)
Monocytes Relative: 19 %
Neutro Abs: 6.2 10*3/uL (ref 1.7–7.7)
Neutrophils Relative %: 69 %
Platelet Count: 238 10*3/uL (ref 150–400)
RBC: 3.99 MIL/uL (ref 3.87–5.11)
RDW: 15.6 % — ABNORMAL HIGH (ref 11.5–15.5)
WBC Count: 9 10*3/uL (ref 4.0–10.5)
nRBC: 0 % (ref 0.0–0.2)

## 2023-04-04 MED ORDER — DIPHENHYDRAMINE HCL 25 MG PO CAPS
50.0000 mg | ORAL_CAPSULE | Freq: Once | ORAL | Status: AC
  Administered 2023-04-04: 50 mg via ORAL
  Filled 2023-04-04: qty 2

## 2023-04-04 MED ORDER — SODIUM CHLORIDE 0.9 % IV SOLN
10.0000 mg | Freq: Once | INTRAVENOUS | Status: AC
  Administered 2023-04-04: 10 mg via INTRAVENOUS
  Filled 2023-04-04: qty 10

## 2023-04-04 MED ORDER — FAM-TRASTUZUMAB DERUXTECAN-NXKI CHEMO 100 MG IV SOLR
3.2000 mg/kg | Freq: Once | INTRAVENOUS | Status: AC
  Administered 2023-04-04: 200 mg via INTRAVENOUS
  Filled 2023-04-04: qty 10

## 2023-04-04 MED ORDER — ACETAMINOPHEN 325 MG PO TABS
650.0000 mg | ORAL_TABLET | Freq: Once | ORAL | Status: AC
  Administered 2023-04-04: 650 mg via ORAL
  Filled 2023-04-04: qty 2

## 2023-04-04 MED ORDER — HEPARIN SOD (PORK) LOCK FLUSH 100 UNIT/ML IV SOLN
500.0000 [IU] | Freq: Once | INTRAVENOUS | Status: AC | PRN
  Administered 2023-04-04: 500 [IU]

## 2023-04-04 MED ORDER — PALONOSETRON HCL INJECTION 0.25 MG/5ML
0.2500 mg | Freq: Once | INTRAVENOUS | Status: AC
  Administered 2023-04-04: 0.25 mg via INTRAVENOUS
  Filled 2023-04-04: qty 5

## 2023-04-04 MED ORDER — DEXTROSE 5 % IV SOLN: Freq: Once | INTRAVENOUS | Status: AC

## 2023-04-04 MED ORDER — SODIUM CHLORIDE 0.9% FLUSH
10.0000 mL | INTRAVENOUS | Status: DC | PRN
  Administered 2023-04-04: 10 mL

## 2023-04-04 MED ORDER — SODIUM CHLORIDE 0.9 % IV SOLN
150.0000 mg | Freq: Once | INTRAVENOUS | Status: AC
  Administered 2023-04-04: 150 mg via INTRAVENOUS
  Filled 2023-04-04: qty 150

## 2023-04-04 NOTE — Progress Notes (Signed)
Patient Care Team: [name], FNP as PCP - General (Family Medicine) Maisie Fus, MD as PCP - Cardiology (Cardiology) Jake Bathe, MD (Cardiology) Alleen Borne, MD (Cardiothoracic Surgery) Anselm Lis, RPH-CPP (Pharmacist) Nils Pyle, MD as Referring Physician (Radiation Oncology) Serena Croissant, MD as Consulting Physician (Hematology and Oncology)  DIAGNOSIS:  Encounter Diagnosis  Name Primary?   Carcinoma of right breast metastatic to lung (HCC) Yes    SUMMARY OF ONCOLOGIC HISTORY: Oncology History  Carcinoma of right breast metastatic to lung (HCC)  10/10/2011 Initial Diagnosis   Carcinoma of right breast metastatic to lung (HCC)   05/02/2022 -  Chemotherapy   Patient is on Treatment Plan : BREAST METASTATIC Fam-Trastuzumab Deruxtecan-nxki (Enhertu) (5.4) q21d     Malignant neoplasm of upper-outer quadrant of right breast in female, estrogen receptor positive (HCC)  03/22/2004 Initial Diagnosis   right mastectomy and sentinel lymph node sampling 03/22/2004 for a right upper-outer quadrant pT2 pN1, stage IIB invasive ductal carcinoma with lobular features, grade 2, estrogen receptor and progesterone receptor positive, HER-2 negative, with an MIB-1 of 9%   05/2004 - 09/18/2004 Chemotherapy   adjuvant chemotherapy with dose dense doxorubicin and cyclophosphamide x4 cycles (first cycle delayed one week) followed by dose dense paclitaxel x4 was completed 09/18/2004   09/2004 - 2009 Anti-estrogen oral therapy   tamoxifen started March 2006, discontinued 2009   08/2011 Relapse/Recurrence   Metastatic disease:large pericardial effusion, large right pleural effusion and possible right middle lobe bronchial obstruction  status post pericardial window placement, fiberoptic bronchoscopy and right Pleurx placement 09/11/2011, with biopsy of the bronchus intermedius and pericardium positive for metastatic breast cancer, ER 91%, PR 100% p with an MIB-1 of 35%, and no  HER-2 amplification, ratio being 1.37   10/10/2011 - 07/2019 Anti-estrogen oral therapy   BOLERO-4 trial 10/10/2011, receiving letrozole and everolimus (stopped for progression)   08/16/2019 Miscellaneous   foundation 1 requested on liver biopsy from 08/16/2019 shows a stable microsatellite status with 1 mutation/MB, amplification of C11orf30, FGF10 and RICTOR, with mutations in ESR1 and PTEN   08/16/2019 Miscellaneous   Liver biopsy: Metastatic carcinoma to the liver consistent with breast primary, ER 100%, PR 100%, Ki-67 5%, HER2 2+ by IHC, FISH HER2 negative   08/24/2019 -  Anti-estrogen oral therapy   fulvestrant started 08/24/2019, repeated every 4 weeks Palbociclib 125 mg daily 21 days on 7 days off started 08/25/2019 Switched to ribociclib s starting 08/22/2020   02/28/2021 -  Radiation Therapy   palliative radiation (Dr. Langston Masker in [place]) 02/28/2021 to right hip area   10/27/2021 -  Anti-estrogen oral therapy   Abemaciclib with fulvestrant   01/14/2022 Miscellaneous   Guardant360: ESR 1 mutation (no other mutations) MSI high was not detected, PTEN, T p53, EGFR amplification, RB1 mutations detected without any approved treatment options     CHIEF COMPLIANT: Follow-up Metastatic carcinoma/Enhertu cycle 16   INTERVAL HISTORY: Barbara Parks is a 72 y.o With Metastatic carcinoma cycle 16  Enhertu. She presents to the clinic for a follow-up. Patient reports diarrhea for the last past month. She says she has been going to the bathroom a couple of times. She takes imodium for relief. She say it last 3 days. Fatigue is bad and she has no appetite.   ALLERGIES:  is allergic to aspirin, latex, and nsaids.  MEDICATIONS:  Current Outpatient Medications  Medication Sig Dispense Refill   carvedilol (COREG) 3.125 MG tablet Take 1 tablet (3.125 mg total) by  mouth 2 (two) times daily with a meal. 60 tablet 2   ezetimibe (ZETIA) 10 MG tablet Take 1 tablet (10 mg total) by mouth daily. 90 tablet 3    fam-trastuzumab deruxtecan-nxki (ENHERTU) 100 MG SOLR as directed Intravenous     gemfibrozil (LOPID) 600 MG tablet TAKE 1 TABLET (600 MG TOTAL) BY MOUTH 2 (TWO) TIMES DAILY BEFORE A MEAL. 180 tablet 2   lidocaine-prilocaine (EMLA) cream Apply to affected area once 30 g 3   loperamide (IMODIUM) 2 MG capsule Take 2 mg by mouth 4 (four) times daily as needed for diarrhea or loose stools. Reported on 11/30/2015     metFORMIN (GLUCOPHAGE-XR) 500 MG 24 hr tablet Take 1 tablet (500 mg total) by mouth daily with supper. Followed by [facility]  3   omeprazole (PRILOSEC OTC) 20 MG tablet Take 20 mg by mouth daily as needed (acid reflux).     ondansetron (ZOFRAN) 8 MG tablet Take 1 tablet (8 mg total) by mouth every 8 (eight) hours as needed for nausea or vomiting. Start on the third day after chemotherapy. 30 tablet 1   prochlorperazine (COMPAZINE) 10 MG tablet Take 1 tablet (10 mg total) by mouth every 6 (six) hours as needed for nausea or vomiting. 30 tablet 1   RIVAROXABAN (XARELTO) VTE STARTER PACK (15 & 20 MG) Follow package directions: Take one 15mg  tablet by mouth twice a day. On day 22, switch to one 20mg  tablet once a day. Take with food. 51 each 0   triamcinolone (KENALOG) 0.025 % ointment Apply 1 Application topically 2 (two) times daily. 15 g 0   warfarin (COUMADIN) 5 MG tablet Take 1 tablet (5 mg total) by mouth daily. 30 tablet 0   cholecalciferol (VITAMIN D3) 25 MCG (1000 UT) tablet Take 2 tablets (2,000 Units total) by mouth daily. (Patient not taking: Reported on 03/14/2023) 180 tablet 4   No current facility-administered medications for this visit.    PHYSICAL EXAMINATION: ECOG PERFORMANCE STATUS: 1 - Symptomatic but completely ambulatory  Vitals:   04/04/23 1000  BP: 114/65  Pulse: (!) 101  Resp: 18  Temp: 97.9 F (36.6 C)  SpO2: 99%   Filed Weights   04/04/23 1000  Weight: 139 lb 12.8 oz (63.4 kg)      LABORATORY DATA:  I have reviewed the data as listed     Latest Ref Rng & Units 03/14/2023   10:22 AM 02/21/2023   11:04 AM 01/31/2023   10:09 AM  CMP  Glucose 70 - 99 mg/dL 161  92  096   BUN 8 - 23 mg/dL 21  23  15    Creatinine 0.44 - 1.00 mg/dL 0.45  4.09  8.11   Sodium 135 - 145 mmol/L 139  139  138   Potassium 3.5 - 5.1 mmol/L 3.7  4.1  3.6   Chloride 98 - 111 mmol/L 108  108  109   CO2 22 - 32 mmol/L 23  22  18    Calcium 8.9 - 10.3 mg/dL 9.5  91.4  9.5   Total Protein 6.5 - 8.1 g/dL 7.5  7.2  6.8   Total Bilirubin 0.3 - 1.2 mg/dL 0.3  0.3  0.3   Alkaline Phos 38 - 126 U/L 106  122  122   AST 15 - 41 U/L 13  14  15    ALT 0 - 44 U/L 6  7  9      Lab Results  Component Value Date   WBC  9.0 04/04/2023   HGB 12.5 04/04/2023   HCT 38.0 04/04/2023   MCV 95.2 04/04/2023   PLT 238 04/04/2023   NEUTROABS 6.2 04/04/2023    ASSESSMENT & PLAN:  Carcinoma of right breast metastatic to lung Metastatic breast cancer (pericardial effusion, pleural effusion, right middle lobe lung lesion, ER 91%, PR 90%, Ki-67 35%, HER2 negative diagnosed February 2023)   (Prior history of right mastectomy 2005, adjuvant chemo with Adriamycin Cytoxan and Taxol followed by tamoxifen)   Current treatment:  1. Ribociclib, fulvestrant, Xgeva, ribociclib discontinued 10/18/2021  2. abemaciclib started 10/27/2021 Foundation 1 in 2021: MSI stable, TMB 1, ESR 1 mutation, PTEN, FGF 10, RICTOR amplifications   Guardant360 01/14/2022: No actionable mutations.  ESR 1 mutation. Liver biopsy 02/26/2022: Metastatic carcinoma consistent with breast primary. Caris mutation testing 02/26/2022: HER2 low, ESR 1 mutation, PTEN   Treatment plan: Enhertu every 3 weeks, today cycle 16 She has a puppy named Ricky Kizzie Ide Tzu 7 months old).   Enhertu toxicities: Fatigue that lasted for 4 to 5 days: We reduced the dosage of her chemotherapy with cycle 8 Nausea and vomiting: Much better controlled Intermittent diarrhea: Improved with Imodium Fatigue and loss of taste and appetite: Last for 1  week: Hopefully she will do better with lower dosage.   08/30/2022: CT chest: Stable moderate right pleural effusion with associated near complete collapse right lower lobe.  Unchanged bone metastases. 11/26/2022: CT CAP: No significant interval change.  Stable right-sided pleural effusion 5 mm lung nodule stable bone mets CA 27-29: 143 (was 695 on 05/23/2022)  03/06/2023: CT chest: Stable, small pleural effusion, stable tiny lung nodules, stable bone metastasis 03/06/2023: Liver MRI: Multiple hepatic metastases overall decreased in size   Radiology review: Given the excellent results on the CT scan and the liver MRI we will continue with the same treatment.   No orders of the defined types were placed in this encounter.  The patient has a good understanding of the overall plan. she agrees with it. she will call with any problems that may develop before the next visit here. Total time spent: 30 mins including face to face time and time spent for planning, charting and co-ordination of care   Tamsen Meek, MD 04/04/23    I Janan Ridge am acting as a Neurosurgeon for [employer]  I have reviewed the above documentation for accuracy and completeness, and I agree with the above.

## 2023-04-04 NOTE — Assessment & Plan Note (Signed)
Metastatic breast cancer (pericardial effusion, pleural effusion, right middle lobe lung lesion, ER 91%, PR 90%, Ki-67 35%, HER2 negative diagnosed February 2023)   (Prior history of right mastectomy 2005, adjuvant chemo with Adriamycin Cytoxan and Taxol followed by tamoxifen)   Current treatment:  1. Ribociclib, fulvestrant, Xgeva, ribociclib discontinued 10/18/2021  2. abemaciclib started 10/27/2021 Foundation 1 in 2021: MSI stable, TMB 1, ESR 1 mutation, PTEN, FGF 10, RICTOR amplifications   Guardant360 01/14/2022: No actionable mutations.  ESR 1 mutation. Liver biopsy 02/26/2022: Metastatic carcinoma consistent with breast primary. Caris mutation testing 02/26/2022: HER2 low, ESR 1 mutation, PTEN   Treatment plan: Enhertu every 3 weeks, today cycle 16 She has a puppy named Ricky Kizzie Ide Tzu 7 months old).   Enhertu toxicities: Fatigue that lasted for 4 to 5 days: We reduced the dosage of her chemotherapy with cycle 8 Nausea and vomiting: Much better controlled Intermittent diarrhea: Improved with Imodium Fatigue and loss of taste and appetite: Last for 1 week: Hopefully she will do better with lower dosage.   08/30/2022: CT chest: Stable moderate right pleural effusion with associated near complete collapse right lower lobe.  Unchanged bone metastases. 11/26/2022: CT CAP: No significant interval change.  Stable right-sided pleural effusion 5 mm lung nodule stable bone mets CA 27-29: 143 (was 695 on 05/23/2022)  03/06/2023: CT chest: Stable, small pleural effusion, stable tiny lung nodules, stable bone metastasis 03/06/2023: Liver MRI: Multiple hepatic metastases overall decreased in size   Radiology review: Given the excellent results on the CT scan and the liver MRI we will continue with the same treatment.

## 2023-04-04 NOTE — Patient Instructions (Signed)
[facility]  Discharge Instructions: Thank you for choosing [facility] to provide your oncology and hematology care.   If you have a lab appointment with the Cancer Center, please go directly to the Cancer Center and check in at the registration area.   Wear comfortable clothing and clothing appropriate for easy access to any Portacath or PICC line.   We strive to give you quality time with your provider. You may need to reschedule your appointment if you arrive late (15 or more minutes).  Arriving late affects you and other patients whose appointments are after yours.  Also, if you miss three or more appointments without notifying the office, you may be dismissed from the clinic at the provider's discretion.      For prescription refill requests, have your pharmacy contact our office and allow 72 hours for refills to be completed.    Today you received the following chemotherapy and/or immunotherapy agents Enhertu.   To help prevent nausea and vomiting after your treatment, we encourage you to take your nausea medication as directed.  BELOW ARE SYMPTOMS THAT SHOULD BE REPORTED IMMEDIATELY: *FEVER GREATER THAN 100.4 F (38 C) OR HIGHER *CHILLS OR SWEATING *NAUSEA AND VOMITING THAT IS NOT CONTROLLED WITH YOUR NAUSEA MEDICATION *UNUSUAL SHORTNESS OF BREATH *UNUSUAL BRUISING OR BLEEDING *URINARY PROBLEMS (pain or burning when urinating, or frequent urination) *BOWEL PROBLEMS (unusual diarrhea, constipation, pain near the anus) TENDERNESS IN MOUTH AND THROAT WITH OR WITHOUT PRESENCE OF ULCERS (sore throat, sores in mouth, or a toothache) UNUSUAL RASH, SWELLING OR PAIN  UNUSUAL VAGINAL DISCHARGE OR ITCHING   Items with * indicate a potential emergency and should be followed up as soon as possible or go to the Emergency Department if any problems should occur.  Please show the CHEMOTHERAPY ALERT CARD or IMMUNOTHERAPY ALERT CARD at check-in  to the Emergency Department and triage nurse.  Should you have questions after your visit or need to cancel or reschedule your appointment, please contact [facility]  Dept: [phone]  and follow the prompts.  Office hours are 8:00 a.m. to 4:30 p.m. Monday - Friday. Please note that voicemails left after 4:00 p.m. may not be returned until the following business day.  We are closed weekends and major holidays. You have access to a nurse at all times for urgent questions. Please call the main number to the clinic Dept: [phone] and follow the prompts.   For any non-urgent questions, you may also contact your provider using MyChart. We now offer e-Visits for anyone 18 and older to request care online for non-urgent symptoms. For details visit [email or web address].   Also download the MyChart app! Go to the app store, search "MyChart", open the app, select [facility], and log in with your MyChart username and password.   

## 2023-04-05 LAB — CANCER ANTIGEN 27.29: CA 27.29: 144 U/mL — ABNORMAL HIGH (ref 0.0–38.6)

## 2023-04-09 ENCOUNTER — Other Ambulatory Visit: Payer: Self-pay

## 2023-04-11 ENCOUNTER — Other Ambulatory Visit: Payer: Self-pay

## 2023-04-17 ENCOUNTER — Other Ambulatory Visit: Payer: Self-pay | Admitting: Hematology and Oncology

## 2023-04-18 ENCOUNTER — Other Ambulatory Visit: Payer: Self-pay | Admitting: Adult Health

## 2023-04-18 DIAGNOSIS — C50411 Malignant neoplasm of upper-outer quadrant of right female breast: Secondary | ICD-10-CM

## 2023-04-18 DIAGNOSIS — C50911 Malignant neoplasm of unspecified site of right female breast: Secondary | ICD-10-CM

## 2023-04-18 DIAGNOSIS — I3131 Malignant pericardial effusion in diseases classified elsewhere: Secondary | ICD-10-CM

## 2023-04-21 ENCOUNTER — Other Ambulatory Visit: Payer: Self-pay

## 2023-04-21 ENCOUNTER — Encounter: Payer: Self-pay | Admitting: Hematology and Oncology

## 2023-04-21 DIAGNOSIS — C7951 Secondary malignant neoplasm of bone: Secondary | ICD-10-CM

## 2023-04-21 DIAGNOSIS — C50911 Malignant neoplasm of unspecified site of right female breast: Secondary | ICD-10-CM

## 2023-04-21 DIAGNOSIS — I82B29 Chronic embolism and thrombosis of unspecified subclavian vein: Secondary | ICD-10-CM

## 2023-04-21 NOTE — Telephone Encounter (Signed)
Attempted to call pt in regards to refill request to find out if her INR is being monitored. LVM for call back

## 2023-04-21 NOTE — Telephone Encounter (Signed)
Pt will have labs 9/27 which will include PT/INR.

## 2023-04-24 MED FILL — Fosaprepitant Dimeglumine For IV Infusion 150 MG (Base Eq): INTRAVENOUS | Qty: 5 | Status: AC

## 2023-04-24 MED FILL — Dexamethasone Sodium Phosphate Inj 100 MG/10ML: INTRAMUSCULAR | Qty: 1 | Status: AC

## 2023-04-25 ENCOUNTER — Inpatient Hospital Stay: Payer: Medicare Other

## 2023-04-25 ENCOUNTER — Ambulatory Visit: Payer: Medicare Other | Admitting: Hematology and Oncology

## 2023-04-25 VITALS — BP 104/83 | HR 88 | Temp 97.8°F | Resp 16 | Wt 139.8 lb

## 2023-04-25 DIAGNOSIS — C50911 Malignant neoplasm of unspecified site of right female breast: Secondary | ICD-10-CM

## 2023-04-25 DIAGNOSIS — Z79899 Other long term (current) drug therapy: Secondary | ICD-10-CM | POA: Diagnosis not present

## 2023-04-25 DIAGNOSIS — R978 Other abnormal tumor markers: Secondary | ICD-10-CM

## 2023-04-25 DIAGNOSIS — C787 Secondary malignant neoplasm of liver and intrahepatic bile duct: Secondary | ICD-10-CM | POA: Diagnosis not present

## 2023-04-25 DIAGNOSIS — I82B29 Chronic embolism and thrombosis of unspecified subclavian vein: Secondary | ICD-10-CM

## 2023-04-25 DIAGNOSIS — C7951 Secondary malignant neoplasm of bone: Secondary | ICD-10-CM

## 2023-04-25 DIAGNOSIS — Z5112 Encounter for antineoplastic immunotherapy: Secondary | ICD-10-CM | POA: Diagnosis not present

## 2023-04-25 DIAGNOSIS — C7801 Secondary malignant neoplasm of right lung: Secondary | ICD-10-CM | POA: Diagnosis not present

## 2023-04-25 DIAGNOSIS — C50411 Malignant neoplasm of upper-outer quadrant of right female breast: Secondary | ICD-10-CM | POA: Diagnosis not present

## 2023-04-25 LAB — CMP (CANCER CENTER ONLY)
ALT: 7 U/L (ref 0–44)
AST: 16 U/L (ref 15–41)
Albumin: 4.2 g/dL (ref 3.5–5.0)
Alkaline Phosphatase: 136 U/L — ABNORMAL HIGH (ref 38–126)
Anion gap: 11 (ref 5–15)
BUN: 20 mg/dL (ref 8–23)
CO2: 23 mmol/L (ref 22–32)
Calcium: 10.2 mg/dL (ref 8.9–10.3)
Chloride: 104 mmol/L (ref 98–111)
Creatinine: 1.23 mg/dL — ABNORMAL HIGH (ref 0.44–1.00)
GFR, Estimated: 47 mL/min — ABNORMAL LOW (ref >60)
Glucose, Bld: 72 mg/dL (ref 70–99)
Potassium: 3.4 mmol/L — ABNORMAL LOW (ref 3.5–5.1)
Sodium: 138 mmol/L (ref 135–145)
Total Bilirubin: 0.3 mg/dL (ref 0.3–1.2)
Total Protein: 8.1 g/dL (ref 6.5–8.1)

## 2023-04-25 LAB — CBC WITH DIFFERENTIAL (CANCER CENTER ONLY)
Abs Immature Granulocytes: 0.07 10*3/uL (ref 0.00–0.07)
Basophils Absolute: 0.1 10*3/uL (ref 0.0–0.1)
Basophils Relative: 1 %
Eosinophils Absolute: 0.4 10*3/uL (ref 0.0–0.5)
Eosinophils Relative: 4 %
HCT: 36.5 % (ref 36.0–46.0)
Hemoglobin: 12.5 g/dL (ref 12.0–15.0)
Immature Granulocytes: 1 %
Lymphocytes Relative: 5 %
Lymphs Abs: 0.5 10*3/uL — ABNORMAL LOW (ref 0.7–4.0)
MCH: 32.1 pg (ref 26.0–34.0)
MCHC: 34.2 g/dL (ref 30.0–36.0)
MCV: 93.6 fL (ref 80.0–100.0)
Monocytes Absolute: 1.8 10*3/uL — ABNORMAL HIGH (ref 0.1–1.0)
Monocytes Relative: 18 %
Neutro Abs: 7.1 10*3/uL (ref 1.7–7.7)
Neutrophils Relative %: 71 %
Platelet Count: 260 10*3/uL (ref 150–400)
RBC: 3.9 MIL/uL (ref 3.87–5.11)
RDW: 15.9 % — ABNORMAL HIGH (ref 11.5–15.5)
WBC Count: 9.9 10*3/uL (ref 4.0–10.5)
nRBC: 0 % (ref 0.0–0.2)

## 2023-04-25 LAB — PROTIME-INR
INR: 3.7 — ABNORMAL HIGH (ref 0.8–1.2)
Prothrombin Time: 37 s — ABNORMAL HIGH (ref 11.4–15.2)

## 2023-04-25 MED ORDER — ACETAMINOPHEN 325 MG PO TABS
650.0000 mg | ORAL_TABLET | Freq: Once | ORAL | Status: AC
  Administered 2023-04-25: 650 mg via ORAL
  Filled 2023-04-25: qty 2

## 2023-04-25 MED ORDER — SODIUM CHLORIDE 0.9 % IV SOLN
10.0000 mg | Freq: Once | INTRAVENOUS | Status: AC
  Administered 2023-04-25: 10 mg via INTRAVENOUS
  Filled 2023-04-25: qty 10

## 2023-04-25 MED ORDER — SODIUM CHLORIDE 0.9% FLUSH
10.0000 mL | INTRAVENOUS | Status: DC | PRN
  Administered 2023-04-25: 10 mL

## 2023-04-25 MED ORDER — PALONOSETRON HCL INJECTION 0.25 MG/5ML
0.2500 mg | Freq: Once | INTRAVENOUS | Status: AC
  Administered 2023-04-25: 0.25 mg via INTRAVENOUS
  Filled 2023-04-25: qty 5

## 2023-04-25 MED ORDER — HEPARIN SOD (PORK) LOCK FLUSH 100 UNIT/ML IV SOLN
500.0000 [IU] | Freq: Once | INTRAVENOUS | Status: AC | PRN
  Administered 2023-04-25: 500 [IU]

## 2023-04-25 MED ORDER — FAM-TRASTUZUMAB DERUXTECAN-NXKI CHEMO 100 MG IV SOLR
3.2000 mg/kg | Freq: Once | INTRAVENOUS | Status: AC
  Administered 2023-04-25: 200 mg via INTRAVENOUS
  Filled 2023-04-25: qty 10

## 2023-04-25 MED ORDER — SODIUM CHLORIDE 0.9 % IV SOLN
150.0000 mg | Freq: Once | INTRAVENOUS | Status: AC
  Administered 2023-04-25: 150 mg via INTRAVENOUS
  Filled 2023-04-25: qty 150

## 2023-04-25 MED ORDER — DIPHENHYDRAMINE HCL 25 MG PO CAPS
50.0000 mg | ORAL_CAPSULE | Freq: Once | ORAL | Status: AC
  Administered 2023-04-25: 50 mg via ORAL
  Filled 2023-04-25: qty 2

## 2023-04-25 MED ORDER — DEXTROSE 5 % IV SOLN: Freq: Once | INTRAVENOUS | Status: AC

## 2023-04-25 NOTE — Progress Notes (Signed)
Patient was observed for 30 minutes post Enhertu infusion with no adverse reaction. Vitals stable and patient in no distress upon leaving infusion clinic.

## 2023-04-25 NOTE — Patient Instructions (Signed)
[facility]  Discharge Instructions: Thank you for choosing [facility] to provide your oncology and hematology care.   If you have a lab appointment with the Cancer Center, please go directly to the Cancer Center and check in at the registration area.   Wear comfortable clothing and clothing appropriate for easy access to any Portacath or PICC line.   We strive to give you quality time with your provider. You may need to reschedule your appointment if you arrive late (15 or more minutes).  Arriving late affects you and other patients whose appointments are after yours.  Also, if you miss three or more appointments without notifying the office, you may be dismissed from the clinic at the provider's discretion.      For prescription refill requests, have your pharmacy contact our office and allow 72 hours for refills to be completed.    Today you received the following chemotherapy and/or immunotherapy agents Enhertu.   To help prevent nausea and vomiting after your treatment, we encourage you to take your nausea medication as directed.  BELOW ARE SYMPTOMS THAT SHOULD BE REPORTED IMMEDIATELY: *FEVER GREATER THAN 100.4 F (38 C) OR HIGHER *CHILLS OR SWEATING *NAUSEA AND VOMITING THAT IS NOT CONTROLLED WITH YOUR NAUSEA MEDICATION *UNUSUAL SHORTNESS OF BREATH *UNUSUAL BRUISING OR BLEEDING *URINARY PROBLEMS (pain or burning when urinating, or frequent urination) *BOWEL PROBLEMS (unusual diarrhea, constipation, pain near the anus) TENDERNESS IN MOUTH AND THROAT WITH OR WITHOUT PRESENCE OF ULCERS (sore throat, sores in mouth, or a toothache) UNUSUAL RASH, SWELLING OR PAIN  UNUSUAL VAGINAL DISCHARGE OR ITCHING   Items with * indicate a potential emergency and should be followed up as soon as possible or go to the Emergency Department if any problems should occur.  Please show the CHEMOTHERAPY ALERT CARD or IMMUNOTHERAPY ALERT CARD at check-in  to the Emergency Department and triage nurse.  Should you have questions after your visit or need to cancel or reschedule your appointment, please contact [facility]  Dept: [phone]  and follow the prompts.  Office hours are 8:00 a.m. to 4:30 p.m. Monday - Friday. Please note that voicemails left after 4:00 p.m. may not be returned until the following business day.  We are closed weekends and major holidays. You have access to a nurse at all times for urgent questions. Please call the main number to the clinic Dept: [phone] and follow the prompts.   For any non-urgent questions, you may also contact your provider using MyChart. We now offer e-Visits for anyone 18 and older to request care online for non-urgent symptoms. For details visit [email or web address].   Also download the MyChart app! Go to the app store, search "MyChart", open the app, select [facility], and log in with your MyChart username and password.   

## 2023-04-26 LAB — CANCER ANTIGEN 27.29: CA 27.29: 173 U/mL — ABNORMAL HIGH (ref 0.0–38.6)

## 2023-04-26 IMAGING — MR MR ABDOMEN WO/W CM
21 series · 48 of 48 positions shown · IV contrast (gadavist)
Comparison: 08/19/2021

CLINICAL DATA: Metastatic breast cancer, assess treatment response
of hepatic metastases

EXAM:
MRI ABDOMEN WITHOUT AND WITH CONTRAST
TECHNIQUE: Multiplanar multisequence MR imaging of the abdomen was performed
both before and after the administration of intravenous contrast.
CONTRAST:  7mL GADAVIST GADOBUTROL 1 MMOL/ML IV SOLN

[Series 2: T2 fat-sat · axial · 6.0mm · 1.25mm/px · 1 of 36 slices shown]
[im 1/36]
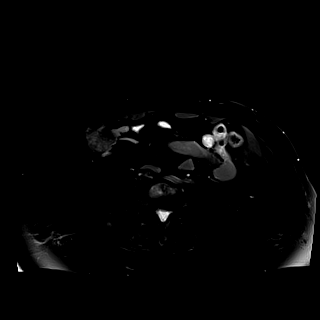

[Series 4: DWI · axial · 6.0mm · 1.64mm/px · 1 of 72 slices shown (1 of 2)]
[im 1/72]
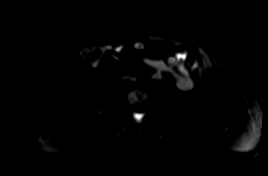

[Series 5: DWI · axial · 6.0mm · 1.64mm/px · 1 of 36 slices shown (2 of 2)]
[im 1/36]
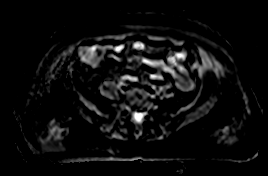

[Series 7: cor haste · coronal · 6.0mm · 1.56mm/px · 1 of 35 slices shown]
[im 1/35]
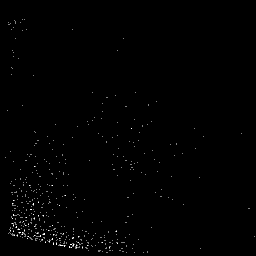

[Series 8: bSSFP · axial · 6.0mm · 2.29mm/px · 1 of 44 slices shown]
[im 1/44]
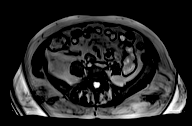

[Series 9: T1 dynamic · axial · 3.0mm · 1.38mm/px · z∈[-174,+88]mm · 2 of 88 slices shown (1 of 12)]
[im 1/88]
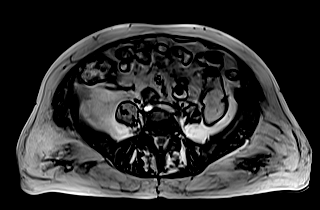
[im 88/88]
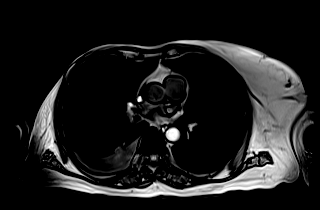

[Series 10: T1 dynamic · axial · 3.0mm · 1.38mm/px · z∈[-174,+88]mm · 2 of 88 slices shown (2 of 12)]
[im 1/88]
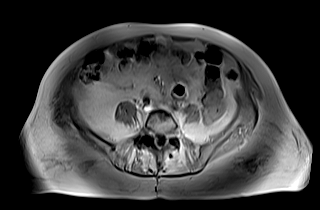
[im 88/88]
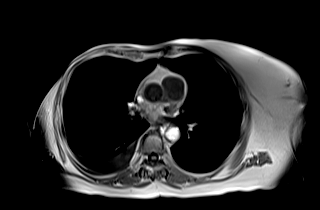

[Series 12: T1 dynamic · axial · 3.0mm · 1.38mm/px · z∈[-174,+88]mm · 3 of 88 slices shown (3 of 12)]
[im 1/88]
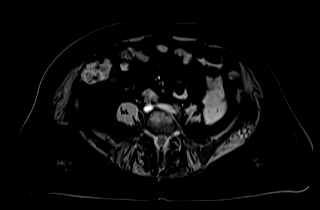
[im 44/88]
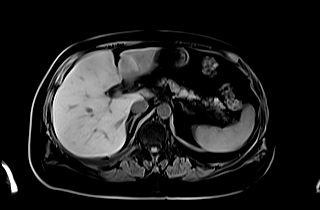
[im 88/88]
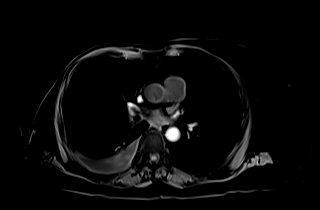

[Series 14: T1 dynamic · axial · 3.0mm · 1.38mm/px · z∈[-174,+88]mm · 3 of 88 slices shown (4 of 12)]
[im 1/88]
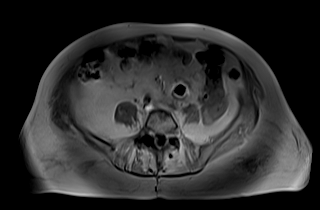
[im 44/88]
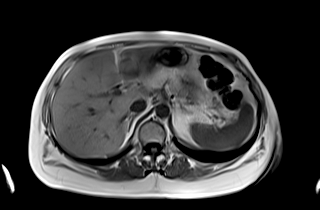
[im 88/88]
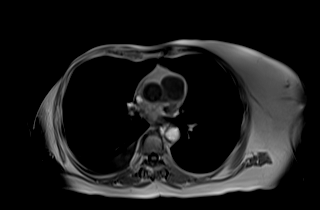

[Series 15: T1 dynamic · axial · 3.0mm · 1.38mm/px · z∈[-174,+88]mm · 3 of 88 slices shown (5 of 12)]
[im 1/88]
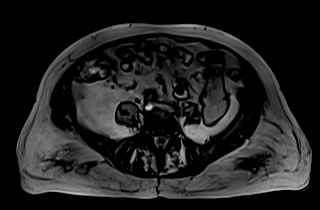
[im 44/88]
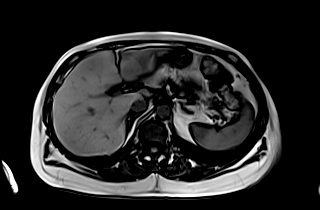
[im 88/88]
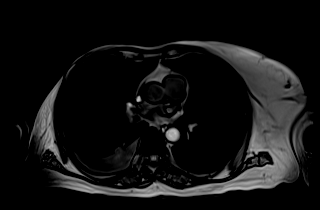

[Series 17: T1 dynamic · axial · 3.0mm · 1.38mm/px · z∈[-174,+88]mm · 3 of 88 slices shown (6 of 12)]
[im 1/88]
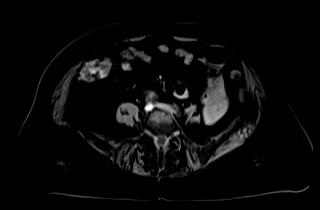
[im 44/88]
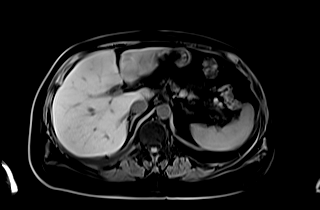
[im 88/88]
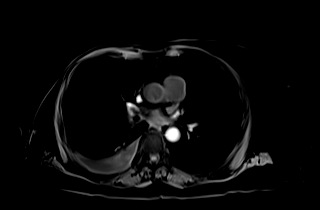

[Series 19: T1 dynamic · axial · 3.0mm · 1.38mm/px · z∈[-174,+88]mm · 3 of 88 slices shown (7 of 12)]
[im 1/88]
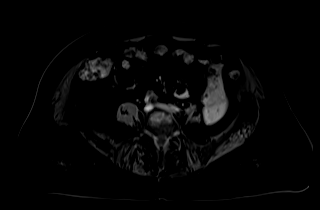
[im 44/88]
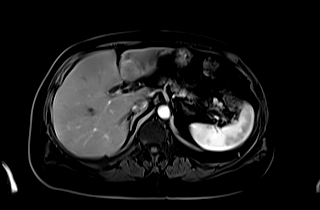
[im 88/88]
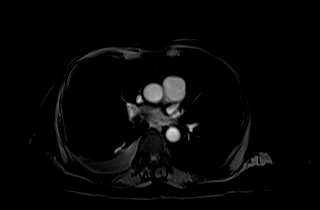

[Series 20: T1 dynamic · axial · 3.0mm · 1.38mm/px · z∈[-174,+88]mm · 3 of 88 slices shown (8 of 12)]
[im 1/88]
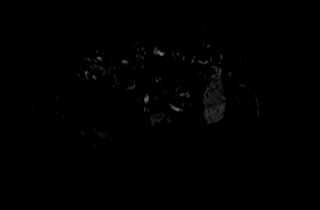
[im 44/88]
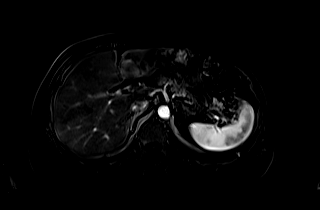
[im 88/88]
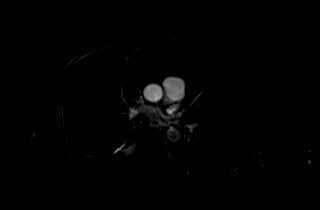

[Series 22: T1 dynamic · axial · 3.0mm · 1.38mm/px · z∈[-174,+88]mm · 3 of 88 slices shown (9 of 12)]
[im 1/88]
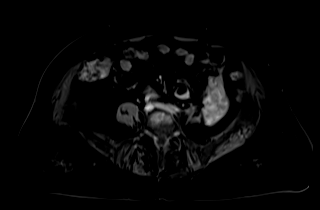
[im 44/88]
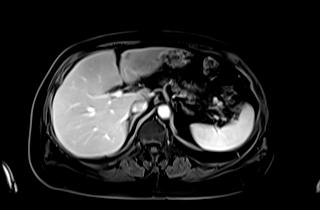
[im 88/88]
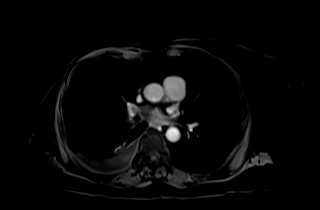

[Series 23: T1 dynamic · axial · 3.0mm · 1.38mm/px · z∈[-174,+88]mm · 3 of 88 slices shown (10 of 12)]
[im 1/88]
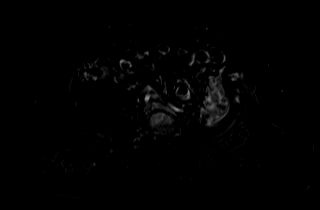
[im 44/88]
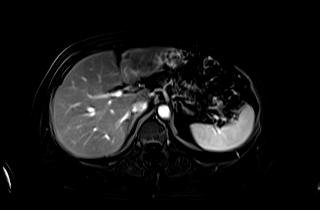
[im 88/88]
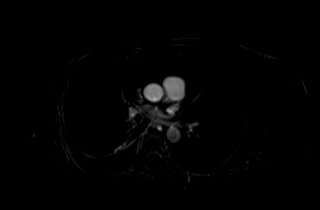

[Series 25: T1 dynamic · axial · 3.0mm · 1.38mm/px · z∈[-174,+88]mm · 3 of 88 slices shown (11 of 12)]
[im 1/88]
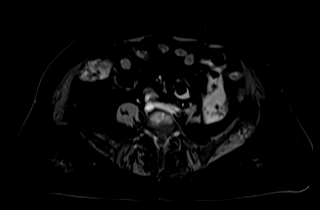
[im 44/88]
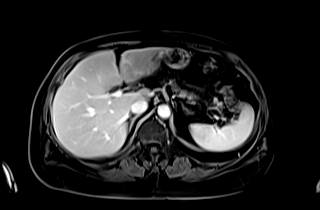
[im 88/88]
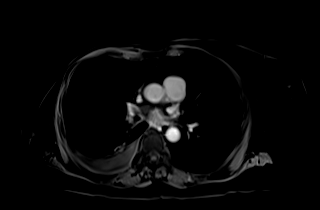

[Series 26: T1 dynamic · axial · 3.0mm · 1.38mm/px · z∈[-174,+88]mm · 3 of 88 slices shown (12 of 12)]
[im 1/88]
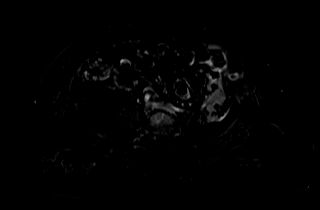
[im 44/88]
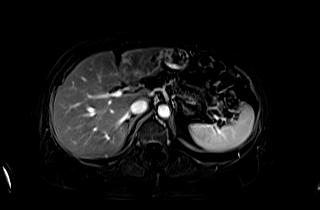
[im 88/88]
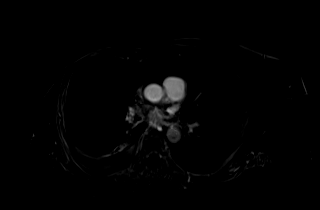

[Series 27: ax_haste_mbh · axial · 6.0mm · 1.38mm/px · 1 of 38 slices shown]
[im 1/38]
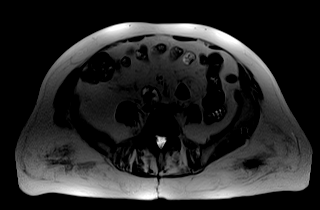

[Series 29: cor_vibe_dixon_delayed_w · coronal · 5.0mm · 1.56mm/px · 2 of 52 slices shown]
[im 1/52]
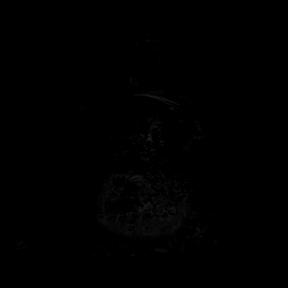
[im 52/52]
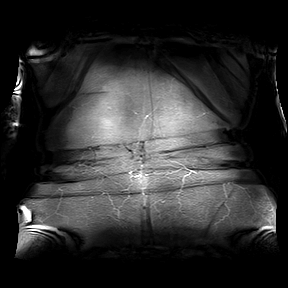

[Series 31: ax_dixon_delayed_w_reg · axial · 3.0mm · 1.38mm/px · z∈[-174,+88]mm · 3 of 88 slices shown]
[im 1/88]
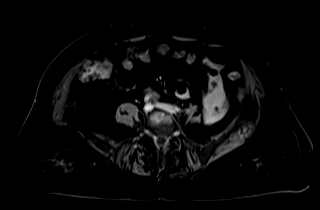
[im 44/88]
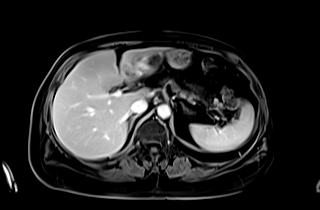
[im 88/88]
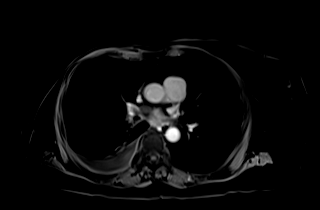

[Series 32: ax_dixon_delayed_w_reg_sub · axial · 3.0mm · 1.38mm/px · z∈[-174,+88]mm · 3 of 88 slices shown]
[im 1/88]
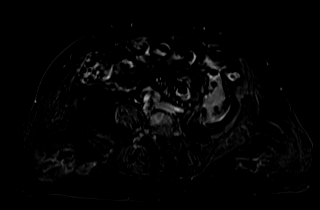
[im 44/88]
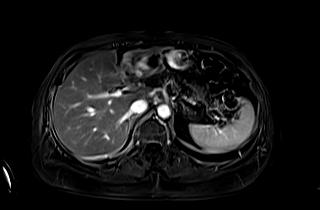
[im 88/88]
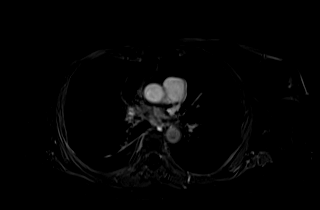

[48 of 48 positions shown; findings below may reference images not displayed]

FINDINGS: Lower chest: Unchanged, moderate right pleural effusion.

Hepatobiliary: Multiple redemonstrated intrinsically T2
hyperintense, arterially enhancing liver lesions. Index lesion of
the anterior left lobe of the liver, hepatic segment III is slightly
enlarged, measuring 2.1 x 1.5 cm, previously 1.6 x 1.4 cm. Lesion
within hepatic segment III just posteriorly is not significantly
changed, measuring 1.2 x 1.0 cm (series 2, image 14). Lesion in the
peripheral right lobe of the liver, hepatic segment V is not
significantly changed, measuring 1.1 x 0.9 cm (series 2, image 15).
Slight interval enlargement of a small lesion of the inferior right
lobe of the liver, hepatic segment V, measuring 1.2 x 0.9 cm,
previously 0.8 x 0.7 cm (series 2, image 19). No gallstones. No
biliary ductal dilatation.

Pancreas: No mass, inflammatory changes, or other parenchymal
abnormality identified.No pancreatic ductal dilatation.

Spleen:  Within normal limits in size and appearance.

Adrenals/Urinary Tract: Fat containing, definitively benign small
adenoma of the left adrenal gland (series 15, image 50). No renal
masses or suspicious contrast enhancement identified. No evidence of
hydronephrosis.

Stomach/Bowel: Visualized portions within the abdomen are
unremarkable.

Vascular/Lymphatic: No pathologically enlarged lymph nodes
identified. No abdominal aortic aneurysm demonstrated.

Other:  None.

Musculoskeletal: Unchanged, enhancing lower thoracic and lumbar
vertebral body metastases, involving T10, T11, L2, and L3.
IMPRESSION: 1. Multiple stable to slightly enlarged hepatic metastatic lesions,
suggesting minimal progression of disease.
2. Unchanged thoracic and lumbar vertebral body metastases.
3. Unchanged, moderate right pleural effusion.

## 2023-05-15 MED FILL — Fosaprepitant Dimeglumine For IV Infusion 150 MG (Base Eq): INTRAVENOUS | Qty: 5 | Status: AC

## 2023-05-16 ENCOUNTER — Inpatient Hospital Stay: Payer: Medicare Other

## 2023-05-16 ENCOUNTER — Telehealth: Payer: Self-pay | Admitting: *Deleted

## 2023-05-16 ENCOUNTER — Other Ambulatory Visit: Payer: Self-pay

## 2023-05-16 ENCOUNTER — Encounter: Payer: Self-pay | Admitting: Adult Health

## 2023-05-16 ENCOUNTER — Other Ambulatory Visit: Payer: Self-pay | Admitting: *Deleted

## 2023-05-16 ENCOUNTER — Inpatient Hospital Stay (HOSPITAL_BASED_OUTPATIENT_CLINIC_OR_DEPARTMENT_OTHER): Payer: Medicare Other | Admitting: Adult Health

## 2023-05-16 ENCOUNTER — Inpatient Hospital Stay: Payer: Medicare Other | Attending: Oncology

## 2023-05-16 VITALS — BP 111/69 | HR 80 | Resp 16

## 2023-05-16 VITALS — BP 104/65 | HR 116 | Temp 97.8°F | Resp 18 | Ht 63.5 in | Wt 138.4 lb

## 2023-05-16 DIAGNOSIS — C7800 Secondary malignant neoplasm of unspecified lung: Secondary | ICD-10-CM | POA: Insufficient documentation

## 2023-05-16 DIAGNOSIS — C50411 Malignant neoplasm of upper-outer quadrant of right female breast: Secondary | ICD-10-CM | POA: Diagnosis present

## 2023-05-16 DIAGNOSIS — Z170 Estrogen receptor positive status [ER+]: Secondary | ICD-10-CM | POA: Diagnosis not present

## 2023-05-16 DIAGNOSIS — C7951 Secondary malignant neoplasm of bone: Secondary | ICD-10-CM | POA: Diagnosis not present

## 2023-05-16 DIAGNOSIS — I82B29 Chronic embolism and thrombosis of unspecified subclavian vein: Secondary | ICD-10-CM

## 2023-05-16 DIAGNOSIS — Z79899 Other long term (current) drug therapy: Secondary | ICD-10-CM | POA: Diagnosis not present

## 2023-05-16 DIAGNOSIS — C50911 Malignant neoplasm of unspecified site of right female breast: Secondary | ICD-10-CM | POA: Diagnosis not present

## 2023-05-16 DIAGNOSIS — Z7901 Long term (current) use of anticoagulants: Secondary | ICD-10-CM | POA: Diagnosis not present

## 2023-05-16 DIAGNOSIS — Z5112 Encounter for antineoplastic immunotherapy: Secondary | ICD-10-CM | POA: Insufficient documentation

## 2023-05-16 DIAGNOSIS — I3131 Malignant pericardial effusion in diseases classified elsewhere: Secondary | ICD-10-CM | POA: Diagnosis not present

## 2023-05-16 DIAGNOSIS — C787 Secondary malignant neoplasm of liver and intrahepatic bile duct: Secondary | ICD-10-CM | POA: Diagnosis not present

## 2023-05-16 DIAGNOSIS — R978 Other abnormal tumor markers: Secondary | ICD-10-CM

## 2023-05-16 DIAGNOSIS — I82291 Chronic embolism and thrombosis of other thoracic veins: Secondary | ICD-10-CM | POA: Insufficient documentation

## 2023-05-16 DIAGNOSIS — Z95828 Presence of other vascular implants and grafts: Secondary | ICD-10-CM

## 2023-05-16 LAB — CBC WITH DIFFERENTIAL (CANCER CENTER ONLY)
Abs Immature Granulocytes: 0.07 10*3/uL (ref 0.00–0.07)
Basophils Absolute: 0.1 10*3/uL (ref 0.0–0.1)
Basophils Relative: 1 %
Eosinophils Absolute: 0.3 10*3/uL (ref 0.0–0.5)
Eosinophils Relative: 3 %
HCT: 39.8 % (ref 36.0–46.0)
Hemoglobin: 12.8 g/dL (ref 12.0–15.0)
Immature Granulocytes: 1 %
Lymphocytes Relative: 8 %
Lymphs Abs: 0.7 10*3/uL (ref 0.7–4.0)
MCH: 31.4 pg (ref 26.0–34.0)
MCHC: 32.2 g/dL (ref 30.0–36.0)
MCV: 97.5 fL (ref 80.0–100.0)
Monocytes Absolute: 1.2 10*3/uL — ABNORMAL HIGH (ref 0.1–1.0)
Monocytes Relative: 14 %
Neutro Abs: 6.1 10*3/uL (ref 1.7–7.7)
Neutrophils Relative %: 73 %
Platelet Count: 329 10*3/uL (ref 150–400)
RBC: 4.08 MIL/uL (ref 3.87–5.11)
RDW: 16.8 % — ABNORMAL HIGH (ref 11.5–15.5)
WBC Count: 8.3 10*3/uL (ref 4.0–10.5)
nRBC: 0 % (ref 0.0–0.2)

## 2023-05-16 LAB — CMP (CANCER CENTER ONLY)
ALT: 8 U/L (ref 0–44)
AST: 17 U/L (ref 15–41)
Albumin: 4.1 g/dL (ref 3.5–5.0)
Alkaline Phosphatase: 129 U/L — ABNORMAL HIGH (ref 38–126)
Anion gap: 9 (ref 5–15)
BUN: 23 mg/dL (ref 8–23)
CO2: 22 mmol/L (ref 22–32)
Calcium: 10.6 mg/dL — ABNORMAL HIGH (ref 8.9–10.3)
Chloride: 108 mmol/L (ref 98–111)
Creatinine: 1.21 mg/dL — ABNORMAL HIGH (ref 0.44–1.00)
GFR, Estimated: 48 mL/min — ABNORMAL LOW (ref >60)
Glucose, Bld: 111 mg/dL — ABNORMAL HIGH (ref 70–99)
Potassium: 3.7 mmol/L (ref 3.5–5.1)
Sodium: 139 mmol/L (ref 135–145)
Total Bilirubin: 0.3 mg/dL (ref 0.3–1.2)
Total Protein: 7.6 g/dL (ref 6.5–8.1)

## 2023-05-16 LAB — PROTIME-INR
INR: 2 — ABNORMAL HIGH (ref 0.8–1.2)
Prothrombin Time: 23.2 s — ABNORMAL HIGH (ref 11.4–15.2)

## 2023-05-16 MED ORDER — SODIUM CHLORIDE 0.9% FLUSH
10.0000 mL | Freq: Once | INTRAVENOUS | Status: AC
  Administered 2023-05-16: 10 mL

## 2023-05-16 MED ORDER — PALONOSETRON HCL INJECTION 0.25 MG/5ML
0.2500 mg | Freq: Once | INTRAVENOUS | Status: AC
  Administered 2023-05-16: 0.25 mg via INTRAVENOUS
  Filled 2023-05-16: qty 5

## 2023-05-16 MED ORDER — HEPARIN SOD (PORK) LOCK FLUSH 100 UNIT/ML IV SOLN
500.0000 [IU] | Freq: Once | INTRAVENOUS | Status: AC | PRN
  Administered 2023-05-16: 500 [IU]

## 2023-05-16 MED ORDER — SODIUM CHLORIDE 0.9 % IV SOLN
150.0000 mg | Freq: Once | INTRAVENOUS | Status: AC
  Administered 2023-05-16: 150 mg via INTRAVENOUS
  Filled 2023-05-16: qty 150

## 2023-05-16 MED ORDER — FAM-TRASTUZUMAB DERUXTECAN-NXKI CHEMO 100 MG IV SOLR
3.2000 mg/kg | Freq: Once | INTRAVENOUS | Status: AC
  Administered 2023-05-16: 200 mg via INTRAVENOUS
  Filled 2023-05-16: qty 10

## 2023-05-16 MED ORDER — SODIUM CHLORIDE 0.9% FLUSH
10.0000 mL | INTRAVENOUS | Status: DC | PRN
  Administered 2023-05-16: 10 mL

## 2023-05-16 MED ORDER — DIPHENHYDRAMINE HCL 25 MG PO CAPS
50.0000 mg | ORAL_CAPSULE | Freq: Once | ORAL | Status: AC
  Administered 2023-05-16: 50 mg via ORAL
  Filled 2023-05-16: qty 2

## 2023-05-16 MED ORDER — ACETAMINOPHEN 325 MG PO TABS
650.0000 mg | ORAL_TABLET | Freq: Once | ORAL | Status: AC
  Administered 2023-05-16: 650 mg via ORAL
  Filled 2023-05-16: qty 2

## 2023-05-16 MED ORDER — DEXAMETHASONE SODIUM PHOSPHATE 10 MG/ML IJ SOLN
10.0000 mg | Freq: Once | INTRAMUSCULAR | Status: AC
  Administered 2023-05-16: 10 mg via INTRAVENOUS
  Filled 2023-05-16: qty 1

## 2023-05-16 MED ORDER — DEXTROSE 5 % IV SOLN: Freq: Once | INTRAVENOUS | Status: AC

## 2023-05-16 NOTE — Telephone Encounter (Signed)
-----   Message from Noreene Filbert sent at 05/16/2023  2:38 PM EDT ----- Inr is good, can you please have patient repeat at next appt. ----- Message ----- From: Interface, Lab In [place] Sent: 05/16/2023   2:07 PM EDT To: Loa Socks, NP

## 2023-05-16 NOTE — Assessment & Plan Note (Signed)
Metastatic breast cancer (pericardial effusion, pleural effusion, right middle lobe lung lesion, ER 91%, PR 90%, Ki-67 35%, HER2 negative diagnosed February 2023)   (Prior history of right mastectomy 2005, adjuvant chemo with Adriamycin Cytoxan and Taxol followed by tamoxifen)   Current treatment:  1. Ribociclib, fulvestrant, Xgeva, ribociclib discontinued 10/18/2021  2. abemaciclib started 10/27/2021 Foundation 1 in 2021: MSI stable, TMB 1, ESR 1 mutation, PTEN, FGF 10, RICTOR amplifications   Guardant360 01/14/2022: No actionable mutations.  ESR 1 mutation. Liver biopsy 02/26/2022: Metastatic carcinoma consistent with breast primary. Caris mutation testing 02/26/2022: HER2 low, ESR 1 mutation, PTEN   Treatment plan: Enhertu every 3 weeks, today cycle 15 She has a puppy named Ricky Kizzie Ide Tzu 7 months old).   Enhertu toxicities: Fatigue: Managed with energy conservation and dose reduction Nausea and vomiting: Much better controlled Intermittent diarrhea: Improved with Imodium    08/30/2022: CT chest: Stable moderate right pleural effusion with associated near complete collapse right lower lobe.  Unchanged bone metastases. 11/26/2022: CT CAP: No significant interval change.  Stable right-sided pleural effusion 5 mm lung nodule stable bone mets CA 27-29: 143 (was 695 on 05/23/2022)  03/06/2023: CT chest: Stable, small pleural effusion, stable tiny lung nodules, stable bone metastasis 03/06/2023: Liver MRI: Multiple hepatic metastases overall decreased in size  Chronic DVT along the brachiocephalic vein around the port: Patient has been asymptomatic and this has not been noted by radiology on previous scans prior to March 06, 2023.  Overall it is unchanged from December 2023.  She is taking Coumadin daily and we are following her INR.  She is due for repeat echocardiogram which has been ordered.  We discussed about the timing of the restaging scans and she would like to go ahead and proceed with those.   I ordered those to occur prior to her next visit with Dr. [name] which is scheduled for June 06, 2023.

## 2023-05-16 NOTE — Patient Instructions (Signed)
[facility]  Discharge Instructions: Thank you for choosing [facility] to provide your oncology and hematology care.   If you have a lab appointment with the Cancer Center, please go directly to the Cancer Center and check in at the registration area.   Wear comfortable clothing and clothing appropriate for easy access to any Portacath or PICC line.   We strive to give you quality time with your provider. You may need to reschedule your appointment if you arrive late (15 or more minutes).  Arriving late affects you and other patients whose appointments are after yours.  Also, if you miss three or more appointments without notifying the office, you may be dismissed from the clinic at the provider's discretion.      For prescription refill requests, have your pharmacy contact our office and allow 72 hours for refills to be completed.    Today you received the following chemotherapy and/or immunotherapy agents Enhertu.   To help prevent nausea and vomiting after your treatment, we encourage you to take your nausea medication as directed.  BELOW ARE SYMPTOMS THAT SHOULD BE REPORTED IMMEDIATELY: *FEVER GREATER THAN 100.4 F (38 C) OR HIGHER *CHILLS OR SWEATING *NAUSEA AND VOMITING THAT IS NOT CONTROLLED WITH YOUR NAUSEA MEDICATION *UNUSUAL SHORTNESS OF BREATH *UNUSUAL BRUISING OR BLEEDING *URINARY PROBLEMS (pain or burning when urinating, or frequent urination) *BOWEL PROBLEMS (unusual diarrhea, constipation, pain near the anus) TENDERNESS IN MOUTH AND THROAT WITH OR WITHOUT PRESENCE OF ULCERS (sore throat, sores in mouth, or a toothache) UNUSUAL RASH, SWELLING OR PAIN  UNUSUAL VAGINAL DISCHARGE OR ITCHING   Items with * indicate a potential emergency and should be followed up as soon as possible or go to the Emergency Department if any problems should occur.  Please show the CHEMOTHERAPY ALERT CARD or IMMUNOTHERAPY ALERT CARD at check-in  to the Emergency Department and triage nurse.  Should you have questions after your visit or need to cancel or reschedule your appointment, please contact [facility]  Dept: [phone]  and follow the prompts.  Office hours are 8:00 a.m. to 4:30 p.m. Monday - Friday. Please note that voicemails left after 4:00 p.m. may not be returned until the following business day.  We are closed weekends and major holidays. You have access to a nurse at all times for urgent questions. Please call the main number to the clinic Dept: [phone] and follow the prompts.   For any non-urgent questions, you may also contact your provider using MyChart. We now offer e-Visits for anyone 18 and older to request care online for non-urgent symptoms. For details visit [email or web address].   Also download the MyChart app! Go to the app store, search "MyChart", open the app, select [facility], and log in with your MyChart username and password.   

## 2023-05-16 NOTE — Telephone Encounter (Signed)
RN attempt x1 to contact pt with below information.  No answer, LVM with detailed information.

## 2023-05-16 NOTE — Progress Notes (Signed)
Ok to use July 3rd ECHO for today's treatment. New ECHO to be scheduled- per Lillard Anes, NP.

## 2023-05-16 NOTE — Progress Notes (Signed)
[facility] Cancer Follow up:    Barbara Patience, FNP [address] [place] [address]   DIAGNOSIS:  Cancer Staging  Carcinoma of right breast metastatic to lung [facility]) Staging form: Breast, AJCC 7th Edition - Clinical: Stage IV (TX, NX, M1) - Signed by Victorino December, MD on 09/09/2013 Laterality: Right Biopsy of metastatic site performed: No - Pathologic: No stage assigned - Unsigned Laterality: Right  Malignant neoplasm of upper-outer quadrant of right breast in female, estrogen receptor positive (HCC) Staging form: Breast, AJCC 7th Edition - Clinical: Stage IV (TX, NX, M1) - Signed by Lowella Dell, MD on 08/13/2014   SUMMARY OF ONCOLOGIC HISTORY: Oncology History  Carcinoma of right breast metastatic to lung (HCC)  10/10/2011 Initial Diagnosis   Carcinoma of right breast metastatic to lung (HCC)   05/02/2022 -  Chemotherapy   Patient is on Treatment Plan : BREAST METASTATIC Fam-Trastuzumab Deruxtecan-nxki (Enhertu) (5.4) q21d     Malignant neoplasm of upper-outer quadrant of right breast in female, estrogen receptor positive (HCC)  03/22/2004 Initial Diagnosis   right mastectomy and sentinel lymph node sampling 03/22/2004 for a right upper-outer quadrant pT2 pN1, stage IIB invasive ductal carcinoma with lobular features, grade 2, estrogen receptor and progesterone receptor positive, HER-2 negative, with an MIB-1 of 9%   05/2004 - 09/18/2004 Chemotherapy   adjuvant chemotherapy with dose dense doxorubicin and cyclophosphamide x4 cycles (first cycle delayed one week) followed by dose dense paclitaxel x4 was completed 09/18/2004   09/2004 - 2009 Anti-estrogen oral therapy   tamoxifen started March 2006, discontinued 2009   08/2011 Relapse/Recurrence   Metastatic disease:large pericardial effusion, large right pleural effusion and possible right middle lobe bronchial obstruction  status post pericardial window placement, fiberoptic  bronchoscopy and right Pleurx placement 09/11/2011, with biopsy of the bronchus intermedius and pericardium positive for metastatic breast cancer, ER 91%, PR 100% p with an MIB-1 of 35%, and no HER-2 amplification, ratio being 1.37   10/10/2011 - 07/2019 Anti-estrogen oral therapy   BOLERO-4 trial 10/10/2011, receiving letrozole and everolimus (stopped for progression)   08/16/2019 Miscellaneous   foundation 1 requested on liver biopsy from 08/16/2019 shows a stable microsatellite status with 1 mutation/MB, amplification of C11orf30, FGF10 and RICTOR, with mutations in ESR1 and PTEN   08/16/2019 Miscellaneous   Liver biopsy: Metastatic carcinoma to the liver consistent with breast primary, ER 100%, PR 100%, Ki-67 5%, HER2 2+ by IHC, FISH HER2 negative   08/24/2019 -  Anti-estrogen oral therapy   fulvestrant started 08/24/2019, repeated every 4 weeks Palbociclib 125 mg daily 21 days on 7 days off started 08/25/2019 Switched to ribociclib s starting 08/22/2020   02/28/2021 -  Radiation Therapy   palliative radiation (Dr. Langston Masker in [place]) 02/28/2021 to right hip area   10/27/2021 -  Anti-estrogen oral therapy   Abemaciclib with fulvestrant   01/14/2022 Miscellaneous   Guardant360: ESR 1 mutation (no other mutations) MSI high was not detected, PTEN, T p53, EGFR amplification, RB1 mutations detected without any approved treatment options   Malignant neoplasm metastatic to liver (HCC)  08/09/2019 Initial Diagnosis   Malignant neoplasm metastatic to liver [facility])     CURRENT THERAPY: Enhertu  INTERVAL HISTORY: Barbara Parks 72 y.o. female returns for follow-up and evaluation [name] of her metastatic breast cancer.  She is doing moderately well today.  Her most recent restaging scans occurred on March 03, 2023 with CT chest that demonstrated stable pleural effusion, stable chest CT,  small amount of stable nonocclusive thrombus in the right brachiocephalic vein that have been present since December  2023.  MRI of the liver at this time demonstrated multiple hepatic metastases, overall decreased in size.  Barbara Parks notes that she was started on Coumadin 5 mg daily.  She was unable to afford Xarelto.  Her most recent INR occurred 3 weeks ago and was 3.7.  She denies any easy bruising or bleeding.  Her most recent echocardiogram occurred on January 29, 2023 demonstrating a left ventricular ejection fraction of 50 to 55%.  She was evaluated by Dr. Wyline Mood and started on Coreg 3.125 mg p.o. twice daily.  She tells me she is feeling well today and has no increased shortness of breath, cough, swelling, orthopnea, chest pain, palpitations, nausea vomiting or appetite loss.  She does have mild fatigue and manages this with energy conservation.  Patient Active Problem List   Diagnosis Date Noted   Port-A-Cath in place 06/13/2022   Malignant neoplasm metastatic to liver (HCC) 08/09/2019   Malignant neoplasm metastatic to bone (HCC) 12/23/2018   Goals of care, counseling/discussion 12/23/2018   Hyperglycemia 10/02/2017   Hepatic steatosis 01/23/2017   OA (osteoarthritis) of hip 06/07/2015   Elevated cholesterol with high triglycerides 03/22/2015   Malignant neoplasm of upper-outer quadrant of right breast in female, estrogen receptor positive (HCC) 08/13/2014   Thoracic aorta atherosclerosis (HCC) 08/13/2014   Proteinuria 06/16/2014   Hypercalcemia 06/16/2014   Fatigue 04/21/2014   ADHD (attention deficit hyperactivity disorder)    Osteopenia due to cancer therapy 09/09/2013   Carcinoma of right breast metastatic to lung (HCC) 10/10/2011   Malignant pericardial effusion 09/10/2011   Pleural effusion 09/10/2011   Pneumonia 08/26/2011   Asthma in adult without complication 08/20/2010   Osteoarthritis of left hip 08/17/2010    is allergic to aspirin, latex, and nsaids.  MEDICAL HISTORY: Past Medical History:  Diagnosis Date   Arthritis    left hip   Asthma    breast ca 2005   breast/chemo R  mastectomy   Breast cancer (HCC)    Dermatitis    Diabetes mellitus without complication (HCC)    GERD (gastroesophageal reflux disease)    Hypercholesteremia    Metastasis to lung (HCC) dx'd 08/2011   Osteopenia due to cancer therapy 09/09/2013   Palpitations    Personal history of chemotherapy    Shortness of breath     SURGICAL HISTORY: Past Surgical History:  Procedure Laterality Date   BREAST SURGERY  2005   right   CATARACT EXTRACTION W/PHACO Left 01/18/2014   Procedure: CATARACT EXTRACTION PHACO AND INTRAOCULAR LENS PLACEMENT (IOC);  Surgeon: Loraine Leriche T. Nile Riggs, MD;  Location: [facility];  Service: Ophthalmology;  Laterality: Left;  CDE 15.79   CATARACT EXTRACTION W/PHACO Right 02/08/2014   Procedure: CATARACT EXTRACTION PHACO AND INTRAOCULAR LENS PLACEMENT (IOC);  Surgeon: Loraine Leriche T. Nile Riggs, MD;  Location: [facility];  Service: Ophthalmology;  Laterality: Right;  CDE 4.41   CHEST TUBE INSERTION  09/11/2011   Procedure: INSERTION PLEURAL DRAINAGE CATHETER;  Surgeon: Alleen Borne, MD;  Location: [facility];  Service: Thoracic;  Laterality: Right;   IR IMAGING GUIDED PORT INSERTION  04/23/2022   MASTECTOMY Right    PERICARDIAL WINDOW  09/11/2011   Procedure: PERICARDIAL WINDOW;  Surgeon: Alleen Borne, MD;  Location: [facility];  Service: Thoracic;  Laterality: N/A;   REMOVAL OF PLEURAL DRAINAGE CATHETER  12/19/2011   Procedure: REMOVAL OF PLEURAL DRAINAGE CATHETER;  Surgeon: Alleen Borne, MD;  Location: [facility];  Service: Thoracic;  Laterality: Right;  TO BE DONE IN MINOR ROOM, SHORT STAY   SPINE SURGERY  1996   TOTAL HIP ARTHROPLASTY Left 06/07/2015   Procedure: LEFT TOTAL HIP ARTHROPLASTY ANTERIOR APPROACH;  Surgeon: Ollen Gross, MD;  Location: [facility];  Service: Orthopedics;  Laterality: Left;   VIDEO BRONCHOSCOPY  09/11/2011   Procedure: VIDEO BRONCHOSCOPY;  Surgeon: Alleen Borne, MD;  Location: [facility];  Service: Thoracic;  Laterality: N/A;    SOCIAL HISTORY: Social History   Socioeconomic  History   Marital status: Single    Spouse name: Not on file   Number of children: Not on file   Years of education: Not on file   Highest education level: Not on file  Occupational History   Not on file  Tobacco Use   Smoking status: Former    Current packs/day: 0.00    Average packs/day: 1.5 packs/day for 30.0 years (45.0 ttl pk-yrs)    Types: Cigarettes    Start date: 09/09/1977    Quit date: 09/10/2007    Years since quitting: 15.6   Smokeless tobacco: Never  Substance and Sexual Activity   Alcohol use: No   Drug use: No   Sexual activity: Not Currently  Other Topics Concern   Not on file  Social History Narrative   Not on file   Social Determinants of Health   Financial Resource Strain: Not on file  Food Insecurity: Not on file  Transportation Needs: Not on file  Physical Activity: Not on file  Stress: Not on file  Social Connections: Not on file  Intimate Partner Violence: Not on file    FAMILY HISTORY: Family History  Problem Relation Age of Onset   Cancer Mother        breast   Heart disease Father    Diabetes Other    Anesthesia problems Neg Hx     Review of Systems  Constitutional:  Positive for fatigue. Negative for appetite change, chills, fever and unexpected weight change.  HENT:   Negative for hearing loss, lump/mass and trouble swallowing.   Eyes:  Negative for eye problems and icterus.  Respiratory:  Negative for chest tightness, cough and shortness of breath.   Cardiovascular:  Negative for chest pain, leg swelling and palpitations.  Gastrointestinal:  Negative for abdominal distention, abdominal pain, constipation, diarrhea, nausea and vomiting.  Endocrine: Negative for hot flashes.  Genitourinary:  Negative for difficulty urinating.   Musculoskeletal:  Negative for arthralgias.  Skin:  Negative for itching and rash.  Neurological:  Negative for dizziness, extremity weakness, headaches and numbness.  Hematological:  Negative for adenopathy.  Does not bruise/bleed easily.  Psychiatric/Behavioral:  Negative for depression. The patient is not nervous/anxious.       PHYSICAL EXAMINATION    Vitals:   05/16/23 1158  BP: 104/65  Pulse: (!) 116  Resp: 18  Temp: 97.8 F (36.6 C)  SpO2: 98%    Physical Exam Constitutional:      General: She is not in acute distress.    Appearance: Normal appearance. She is not toxic-appearing.  HENT:     Head: Normocephalic and atraumatic.     Mouth/Throat:     Mouth: Mucous membranes are moist.     Pharynx: Oropharynx is clear. No oropharyngeal exudate or posterior oropharyngeal erythema.  Eyes:     General: No scleral icterus. Cardiovascular:     Rate and Rhythm: Normal rate and regular rhythm.     Pulses:  Normal pulses.     Heart sounds: Normal heart sounds.  Pulmonary:     Effort: Pulmonary effort is normal.     Breath sounds: Normal breath sounds.  Abdominal:     General: Abdomen is flat. Bowel sounds are normal. There is no distension.     Palpations: Abdomen is soft.     Tenderness: There is no abdominal tenderness.  Musculoskeletal:        General: No swelling.     Cervical back: Neck supple.  Lymphadenopathy:     Cervical: No cervical adenopathy.  Skin:    General: Skin is warm and dry.     Findings: No rash.  Neurological:     General: No focal deficit present.     Mental Status: She is alert.  Psychiatric:        Mood and Affect: Mood normal.        Behavior: Behavior normal.     LABORATORY DATA:  CBC    Component Value Date/Time   WBC 8.3 05/16/2023 1126   WBC 10.0 02/26/2022 1123   RBC 4.08 05/16/2023 1126   HGB 12.8 05/16/2023 1126   HGB 14.1 07/08/2017 0942   HCT 39.8 05/16/2023 1126   HCT 42.9 07/08/2017 0942   PLT 329 05/16/2023 1126   PLT 283 07/08/2017 0942   MCV 97.5 05/16/2023 1126   MCV 81.1 07/08/2017 0942   MCH 31.4 05/16/2023 1126   MCHC 32.2 05/16/2023 1126   RDW 16.8 (H) 05/16/2023 1126   RDW 14.6 (H) 07/08/2017 0942    LYMPHSABS 0.7 05/16/2023 1126   LYMPHSABS 1.2 07/08/2017 0942   MONOABS 1.2 (H) 05/16/2023 1126   MONOABS 0.7 07/08/2017 0942   EOSABS 0.3 05/16/2023 1126   EOSABS 0.1 07/08/2017 0942   BASOSABS 0.1 05/16/2023 1126   BASOSABS 0.0 07/08/2017 0942    CMP     Component Value Date/Time   NA 139 05/16/2023 1126   NA 139 07/08/2017 0942   K 3.7 05/16/2023 1126   K 3.8 07/08/2017 0942   CL 108 05/16/2023 1126   CL 109 (H) 12/31/2012 0940   CO2 22 05/16/2023 1126   CO2 18 (L) 07/08/2017 0942   GLUCOSE 111 (H) 05/16/2023 1126   GLUCOSE 156 (H) 07/08/2017 0942   GLUCOSE 127 (H) 12/31/2012 0940   BUN 23 05/16/2023 1126   BUN 17.7 07/08/2017 0942   CREATININE 1.21 (H) 05/16/2023 1126   CREATININE 1.0 07/08/2017 0942   CALCIUM 10.6 (H) 05/16/2023 1126   CALCIUM 10.4 07/08/2017 0942   PROT 7.6 05/16/2023 1126   PROT 7.7 07/08/2017 0942   ALBUMIN 4.1 05/16/2023 1126   ALBUMIN 3.4 (L) 07/08/2017 0942   AST 17 05/16/2023 1126   AST 54 (H) 07/08/2017 0942   ALT 8 05/16/2023 1126   ALT 39 07/08/2017 0942   ALKPHOS 129 (H) 05/16/2023 1126   ALKPHOS 232 (H) 07/08/2017 0942   BILITOT 0.3 05/16/2023 1126   BILITOT 0.42 07/08/2017 0942   GFRNONAA 48 (L) 05/16/2023 1126   GFRAA 49 (L) 04/05/2020 1504   GFRAA 54 (L) 12/25/2017 0959     ASSESSMENT and THERAPY PLAN:   Malignant neoplasm of upper-outer quadrant of right breast in female, estrogen receptor positive (HCC) Metastatic breast cancer (pericardial effusion, pleural effusion, right middle lobe lung lesion, ER 91%, PR 90%, Ki-67 35%, HER2 negative diagnosed February 2023)   (Prior history of right mastectomy 2005, adjuvant chemo with Adriamycin Cytoxan and Taxol followed by tamoxifen)   Current  treatment:  1. Ribociclib, fulvestrant, Xgeva, ribociclib discontinued 10/18/2021  2. abemaciclib started 10/27/2021 Foundation 1 in 2021: MSI stable, TMB 1, ESR 1 mutation, PTEN, FGF 10, RICTOR amplifications   Guardant360 01/14/2022: No  actionable mutations.  ESR 1 mutation. Liver biopsy 02/26/2022: Metastatic carcinoma consistent with breast primary. Caris mutation testing 02/26/2022: HER2 low, ESR 1 mutation, PTEN   Treatment plan: Enhertu every 3 weeks, today cycle 15 She has a puppy named Barbara Parks 7 months old).   Enhertu toxicities: Fatigue: Managed with energy conservation and dose reduction Nausea and vomiting: Much better controlled Intermittent diarrhea: Improved with Imodium    08/30/2022: CT chest: Stable moderate right pleural effusion with associated near complete collapse right lower lobe.  Unchanged bone metastases. 11/26/2022: CT CAP: No significant interval change.  Stable right-sided pleural effusion 5 mm lung nodule stable bone mets CA 27-29: 143 (was 695 on 05/23/2022)  03/06/2023: CT chest: Stable, small pleural effusion, stable tiny lung nodules, stable bone metastasis 03/06/2023: Liver MRI: Multiple hepatic metastases overall decreased in size  Chronic DVT along the brachiocephalic vein around the port: Patient has been asymptomatic and this has not been noted by radiology on previous scans prior to March 06, 2023.  Overall it is unchanged from December 2023.  She is taking Coumadin daily and we are following her INR.  She is due for repeat echocardiogram which has been ordered.  We discussed about the timing of the restaging scans and she would like to go ahead and proceed with those.  I ordered those to occur prior to her next visit with Dr. [name] which is scheduled for June 06, 2023.  All questions were answered. The patient knows to call the clinic with any problems, questions or concerns. We can certainly see the patient much sooner if necessary.  Total encounter time:30 minutes*in face-to-face visit time, chart review, lab review, care coordination, order entry, and documentation of the encounter time.    Lillard Anes, NP 05/16/23 12:57 PM Medical Oncology and Hematology [facility] [address] [place], [place] [address] Tel. [phone]    Fax. [phone]  *Total Encounter Time as defined by the Centers for Medicare and Medicaid Services includes, in addition to the face-to-face time of a patient visit (documented in the note above) non-face-to-face time: obtaining and reviewing outside history, ordering and reviewing medications, tests or procedures, care coordination (communications with other health care professionals or caregivers) and documentation in the medical record.

## 2023-05-17 LAB — CANCER ANTIGEN 27.29: CA 27.29: 240.5 U/mL — ABNORMAL HIGH (ref 0.0–38.6)

## 2023-05-23 ENCOUNTER — Ambulatory Visit (HOSPITAL_COMMUNITY)
Admission: RE | Admit: 2023-05-23 | Discharge: 2023-05-23 | Disposition: A | Discharge status: Home / Self Care | Payer: Medicare Other | Source: Ambulatory Visit | Attending: Adult Health | Admitting: Adult Health

## 2023-05-23 DIAGNOSIS — C7800 Secondary malignant neoplasm of unspecified lung: Secondary | ICD-10-CM | POA: Diagnosis not present

## 2023-05-23 DIAGNOSIS — J90 Pleural effusion, not elsewhere classified: Secondary | ICD-10-CM | POA: Diagnosis not present

## 2023-05-23 DIAGNOSIS — C50911 Malignant neoplasm of unspecified site of right female breast: Secondary | ICD-10-CM | POA: Diagnosis not present

## 2023-05-23 DIAGNOSIS — J432 Centrilobular emphysema: Secondary | ICD-10-CM | POA: Diagnosis not present

## 2023-05-23 DIAGNOSIS — J929 Pleural plaque without asbestos: Secondary | ICD-10-CM | POA: Diagnosis not present

## 2023-05-23 MED ORDER — IOHEXOL 300 MG/ML  SOLN
75.0000 mL | Freq: Once | INTRAMUSCULAR | Status: AC | PRN
  Administered 2023-05-23: 75 mL via INTRAVENOUS

## 2023-05-28 ENCOUNTER — Ambulatory Visit (HOSPITAL_COMMUNITY)
Admission: RE | Admit: 2023-05-28 | Discharge: 2023-05-28 | Disposition: A | Discharge status: Home / Self Care | Payer: Medicare Other | Source: Ambulatory Visit | Attending: Adult Health | Admitting: Adult Health

## 2023-05-28 DIAGNOSIS — C7800 Secondary malignant neoplasm of unspecified lung: Secondary | ICD-10-CM | POA: Insufficient documentation

## 2023-05-28 DIAGNOSIS — I3131 Malignant pericardial effusion in diseases classified elsewhere: Secondary | ICD-10-CM | POA: Insufficient documentation

## 2023-05-28 DIAGNOSIS — C50919 Malignant neoplasm of unspecified site of unspecified female breast: Secondary | ICD-10-CM | POA: Diagnosis not present

## 2023-05-28 DIAGNOSIS — C50911 Malignant neoplasm of unspecified site of right female breast: Secondary | ICD-10-CM | POA: Insufficient documentation

## 2023-05-28 DIAGNOSIS — R918 Other nonspecific abnormal finding of lung field: Secondary | ICD-10-CM | POA: Diagnosis not present

## 2023-05-28 DIAGNOSIS — K769 Liver disease, unspecified: Secondary | ICD-10-CM | POA: Diagnosis not present

## 2023-05-28 LAB — ECHOCARDIOGRAM COMPLETE
Calc EF: 43.1 %
S' Lateral: 2.8 cm
Single Plane A2C EF: 48.4 %
Single Plane A4C EF: 36.9 %

## 2023-05-28 MED ORDER — GADOBUTROL 1 MMOL/ML IV SOLN
6.0000 mL | Freq: Once | INTRAVENOUS | Status: AC | PRN
  Administered 2023-05-28: 6 mL via INTRAVENOUS

## 2023-05-29 ENCOUNTER — Telehealth: Payer: Self-pay

## 2023-05-29 NOTE — Telephone Encounter (Signed)
This RN LVM for pt to call back regarding medication list clarification. Call back number [phone] provided.

## 2023-05-29 NOTE — Telephone Encounter (Signed)
This RN called pt to confirm if she is taking Coreg. Pt states that she is still taking Coreg. Mardella Layman, NP notified.

## 2023-05-29 NOTE — Telephone Encounter (Signed)
-----   Message from Noreene Filbert sent at 05/29/2023  9:56 AM EDT ----- Please call patient and ask if she is taking Coreg. ----- Message ----- From: Interface, [employer] One Seven Sent: 05/28/2023   1:27 PM EDT To: Loa Socks, NP

## 2023-06-02 ENCOUNTER — Other Ambulatory Visit: Payer: Self-pay | Admitting: Adult Health

## 2023-06-02 DIAGNOSIS — I3131 Malignant pericardial effusion in diseases classified elsewhere: Secondary | ICD-10-CM

## 2023-06-02 DIAGNOSIS — C787 Secondary malignant neoplasm of liver and intrahepatic bile duct: Secondary | ICD-10-CM

## 2023-06-02 DIAGNOSIS — C50911 Malignant neoplasm of unspecified site of right female breast: Secondary | ICD-10-CM

## 2023-06-02 DIAGNOSIS — Z170 Estrogen receptor positive status [ER+]: Secondary | ICD-10-CM

## 2023-06-02 DIAGNOSIS — C7951 Secondary malignant neoplasm of bone: Secondary | ICD-10-CM

## 2023-06-05 ENCOUNTER — Other Ambulatory Visit: Payer: Self-pay

## 2023-06-05 MED FILL — Fosaprepitant Dimeglumine For IV Infusion 150 MG (Base Eq): INTRAVENOUS | Qty: 5 | Status: AC

## 2023-06-06 ENCOUNTER — Other Ambulatory Visit: Payer: Self-pay

## 2023-06-06 ENCOUNTER — Inpatient Hospital Stay: Payer: Medicare Other | Attending: Oncology

## 2023-06-06 ENCOUNTER — Inpatient Hospital Stay (HOSPITAL_BASED_OUTPATIENT_CLINIC_OR_DEPARTMENT_OTHER): Payer: Medicare Other | Attending: Oncology | Admitting: Hematology and Oncology

## 2023-06-06 ENCOUNTER — Other Ambulatory Visit (HOSPITAL_COMMUNITY): Payer: Self-pay

## 2023-06-06 ENCOUNTER — Inpatient Hospital Stay: Payer: Medicare Other

## 2023-06-06 VITALS — BP 109/66 | HR 104 | Temp 97.2°F | Resp 18 | Ht 63.5 in | Wt 138.4 lb

## 2023-06-06 VITALS — HR 98

## 2023-06-06 DIAGNOSIS — C50911 Malignant neoplasm of unspecified site of right female breast: Secondary | ICD-10-CM

## 2023-06-06 DIAGNOSIS — C787 Secondary malignant neoplasm of liver and intrahepatic bile duct: Secondary | ICD-10-CM | POA: Insufficient documentation

## 2023-06-06 DIAGNOSIS — Z79899 Other long term (current) drug therapy: Secondary | ICD-10-CM | POA: Insufficient documentation

## 2023-06-06 DIAGNOSIS — Z95828 Presence of other vascular implants and grafts: Secondary | ICD-10-CM

## 2023-06-06 DIAGNOSIS — Z5112 Encounter for antineoplastic immunotherapy: Secondary | ICD-10-CM | POA: Insufficient documentation

## 2023-06-06 DIAGNOSIS — C50411 Malignant neoplasm of upper-outer quadrant of right female breast: Secondary | ICD-10-CM | POA: Diagnosis not present

## 2023-06-06 DIAGNOSIS — C7951 Secondary malignant neoplasm of bone: Secondary | ICD-10-CM | POA: Diagnosis not present

## 2023-06-06 DIAGNOSIS — C7800 Secondary malignant neoplasm of unspecified lung: Secondary | ICD-10-CM | POA: Diagnosis not present

## 2023-06-06 LAB — CBC WITH DIFFERENTIAL (CANCER CENTER ONLY)
Abs Immature Granulocytes: 0.03 10*3/uL (ref 0.00–0.07)
Basophils Absolute: 0.1 10*3/uL (ref 0.0–0.1)
Basophils Relative: 1 %
Eosinophils Absolute: 0.3 10*3/uL (ref 0.0–0.5)
Eosinophils Relative: 3 %
HCT: 37.7 % (ref 36.0–46.0)
Hemoglobin: 12.7 g/dL (ref 12.0–15.0)
Immature Granulocytes: 0 %
Lymphocytes Relative: 8 %
Lymphs Abs: 0.7 10*3/uL (ref 0.7–4.0)
MCH: 32.3 pg (ref 26.0–34.0)
MCHC: 33.7 g/dL (ref 30.0–36.0)
MCV: 95.9 fL (ref 80.0–100.0)
Monocytes Absolute: 1.3 10*3/uL — ABNORMAL HIGH (ref 0.1–1.0)
Monocytes Relative: 14 %
Neutro Abs: 6.5 10*3/uL (ref 1.7–7.7)
Neutrophils Relative %: 74 %
Platelet Count: 316 10*3/uL (ref 150–400)
RBC: 3.93 MIL/uL (ref 3.87–5.11)
RDW: 16.2 % — ABNORMAL HIGH (ref 11.5–15.5)
WBC Count: 8.8 10*3/uL (ref 4.0–10.5)
nRBC: 0 % (ref 0.0–0.2)

## 2023-06-06 LAB — CMP (CANCER CENTER ONLY)
ALT: 7 U/L (ref 0–44)
AST: 17 U/L (ref 15–41)
Albumin: 4.2 g/dL (ref 3.5–5.0)
Alkaline Phosphatase: 138 U/L — ABNORMAL HIGH (ref 38–126)
Anion gap: 9 (ref 5–15)
BUN: 18 mg/dL (ref 8–23)
CO2: 21 mmol/L — ABNORMAL LOW (ref 22–32)
Calcium: 10.3 mg/dL (ref 8.9–10.3)
Chloride: 107 mmol/L (ref 98–111)
Creatinine: 1.18 mg/dL — ABNORMAL HIGH (ref 0.44–1.00)
GFR, Estimated: 49 mL/min — ABNORMAL LOW (ref >60)
Glucose, Bld: 82 mg/dL (ref 70–99)
Potassium: 3.8 mmol/L (ref 3.5–5.1)
Sodium: 137 mmol/L (ref 135–145)
Total Bilirubin: 0.3 mg/dL (ref <1.2)
Total Protein: 7.5 g/dL (ref 6.5–8.1)

## 2023-06-06 MED ORDER — DEXTROSE 5 % IV SOLN: Freq: Once | INTRAVENOUS | Status: AC

## 2023-06-06 MED ORDER — SODIUM CHLORIDE 0.9% FLUSH
10.0000 mL | Freq: Once | INTRAVENOUS | Status: AC
  Administered 2023-06-06: 10 mL

## 2023-06-06 MED ORDER — FAM-TRASTUZUMAB DERUXTECAN-NXKI CHEMO 100 MG IV SOLR
3.2000 mg/kg | Freq: Once | INTRAVENOUS | Status: AC
  Administered 2023-06-06: 200 mg via INTRAVENOUS
  Filled 2023-06-06: qty 10

## 2023-06-06 MED ORDER — DIPHENHYDRAMINE HCL 25 MG PO CAPS
50.0000 mg | ORAL_CAPSULE | Freq: Once | ORAL | Status: AC
  Administered 2023-06-06: 50 mg via ORAL
  Filled 2023-06-06: qty 2

## 2023-06-06 MED ORDER — DEXAMETHASONE SODIUM PHOSPHATE 10 MG/ML IJ SOLN
10.0000 mg | Freq: Once | INTRAMUSCULAR | Status: AC
  Administered 2023-06-06: 10 mg via INTRAVENOUS
  Filled 2023-06-06: qty 1

## 2023-06-06 MED ORDER — SODIUM CHLORIDE 0.9% FLUSH
10.0000 mL | INTRAVENOUS | Status: DC | PRN
  Administered 2023-06-06: 10 mL

## 2023-06-06 MED ORDER — PALONOSETRON HCL INJECTION 0.25 MG/5ML
0.2500 mg | Freq: Once | INTRAVENOUS | Status: AC
Start: 2023-06-06 — End: 2023-06-06
  Administered 2023-06-06: 0.25 mg via INTRAVENOUS
  Filled 2023-06-06: qty 5

## 2023-06-06 MED ORDER — ELACESTRANT HYDROCHLORIDE 345 MG PO TABS
345.0000 mg | ORAL_TABLET | Freq: Every day | ORAL | 3 refills | Status: DC
  Filled 2023-06-13: qty 30, 30d supply, fill #0

## 2023-06-06 MED ORDER — ACETAMINOPHEN 325 MG PO TABS
650.0000 mg | ORAL_TABLET | Freq: Once | ORAL | Status: AC
  Administered 2023-06-06: 650 mg via ORAL
  Filled 2023-06-06: qty 2

## 2023-06-06 MED ORDER — SODIUM CHLORIDE 0.9 % IV SOLN
150.0000 mg | Freq: Once | INTRAVENOUS | Status: AC
  Administered 2023-06-06: 150 mg via INTRAVENOUS
  Filled 2023-06-06: qty 150

## 2023-06-06 MED ORDER — HEPARIN SOD (PORK) LOCK FLUSH 100 UNIT/ML IV SOLN
500.0000 [IU] | Freq: Once | INTRAVENOUS | Status: AC | PRN
  Administered 2023-06-06: 500 [IU]

## 2023-06-06 NOTE — Assessment & Plan Note (Addendum)
Metastatic breast cancer (pericardial effusion, pleural effusion, right middle lobe lung lesion, ER 91%, PR 90%, Ki-67 35%, HER2 negative diagnosed February 2023)   (Prior history of right mastectomy 2005, adjuvant chemo with Adriamycin Cytoxan and Taxol followed by tamoxifen)   Current treatment:  1. Ribociclib, fulvestrant, Xgeva, ribociclib discontinued 10/18/2021  2. abemaciclib started 10/27/2021 Foundation 1 in 2021: MSI stable, TMB 1, ESR 1 mutation, PTEN, FGF 10, RICTOR amplifications   Guardant360 01/14/2022: No actionable mutations.  ESR 1 mutation. Liver biopsy 02/26/2022: Metastatic carcinoma consistent with breast primary. Caris mutation testing 02/26/2022: HER2 low, ESR 1 mutation, PTEN   Treatment plan: Enhertu every 3 weeks, today cycle 16 She has a puppy named Ricky Kizzie Ide Tzu 7 months old).   Enhertu toxicities: Fatigue that lasted for 4 to 5 days: We reduced the dosage of her chemotherapy with cycle 8 Nausea and vomiting: Much better controlled Intermittent diarrhea: Improved with Imodium Fatigue and loss of taste and appetite: Last for 1 week: Hopefully she will do better with lower dosage.   08/30/2022: CT chest: Stable moderate right pleural effusion with associated near complete collapse right lower lobe.  Unchanged bone metastases. 11/26/2022: CT CAP: No significant interval change.  Stable right-sided pleural effusion 5 mm lung nodule stable bone mets CA 27-29: 143 (was 695 on 05/23/2022)  03/06/2023: CT chest: Stable, small pleural effusion, stable tiny lung nodules, stable bone metastasis 03/06/2023: Liver MRI: Multiple hepatic metastases overall decreased in size 05/29/2023: MRI liver: Multiple liver lesions slightly increased in size and conspicuity.  Stable vertebral body metastasis benign left adrenal adenoma 06/05/2023: Unchanged moderate right pleural effusion  Discussion: With the increase in tumor marker CA 27-29 increasing to 240 and the slight progression noted on  the liver MRI, I discussed with her about switching treatment to Elacestrant (prior ESR 1 mutation)  We will proceed with today's treatment and then switch her to Elacestrant as soon as she gets that medication

## 2023-06-06 NOTE — Progress Notes (Signed)
Patient Care Team: [name], FNP as PCP - General (Family Medicine) Maisie Fus, MD as PCP - Cardiology (Cardiology) Jake Bathe, MD (Cardiology) Alleen Borne, MD (Cardiothoracic Surgery) Anselm Lis, RPH-CPP (Pharmacist) Nils Pyle, MD as Referring Physician (Radiation Oncology) Serena Croissant, MD as Consulting Physician (Hematology and Oncology)  DIAGNOSIS:  Encounter Diagnosis  Name Primary?   Carcinoma of right breast metastatic to lung (HCC) Yes    SUMMARY OF ONCOLOGIC HISTORY: Oncology History  Carcinoma of right breast metastatic to lung (HCC)  10/10/2011 Initial Diagnosis   Carcinoma of right breast metastatic to lung (HCC)   05/02/2022 -  Chemotherapy   Patient is on Treatment Plan : BREAST METASTATIC Fam-Trastuzumab Deruxtecan-nxki (Enhertu) (5.4) q21d     Malignant neoplasm of upper-outer quadrant of right breast in female, estrogen receptor positive (HCC)  03/22/2004 Initial Diagnosis   right mastectomy and sentinel lymph node sampling 03/22/2004 for a right upper-outer quadrant pT2 pN1, stage IIB invasive ductal carcinoma with lobular features, grade 2, estrogen receptor and progesterone receptor positive, HER-2 negative, with an MIB-1 of 9%   05/2004 - 09/18/2004 Chemotherapy   adjuvant chemotherapy with dose dense doxorubicin and cyclophosphamide x4 cycles (first cycle delayed one week) followed by dose dense paclitaxel x4 was completed 09/18/2004   09/2004 - 2009 Anti-estrogen oral therapy   tamoxifen started March 2006, discontinued 2009   08/2011 Relapse/Recurrence   Metastatic disease:large pericardial effusion, large right pleural effusion and possible right middle lobe bronchial obstruction  status post pericardial window placement, fiberoptic bronchoscopy and right Pleurx placement 09/11/2011, with biopsy of the bronchus intermedius and pericardium positive for metastatic breast cancer, ER 91%, PR 100% p with an MIB-1 of 35%, and no  HER-2 amplification, ratio being 1.37   10/10/2011 - 07/2019 Anti-estrogen oral therapy   BOLERO-4 trial 10/10/2011, receiving letrozole and everolimus (stopped for progression)   08/16/2019 Miscellaneous   foundation 1 requested on liver biopsy from 08/16/2019 shows a stable microsatellite status with 1 mutation/MB, amplification of C11orf30, FGF10 and RICTOR, with mutations in ESR1 and PTEN   08/16/2019 Miscellaneous   Liver biopsy: Metastatic carcinoma to the liver consistent with breast primary, ER 100%, PR 100%, Ki-67 5%, HER2 2+ by IHC, FISH HER2 negative   08/24/2019 -  Anti-estrogen oral therapy   fulvestrant started 08/24/2019, repeated every 4 weeks Palbociclib 125 mg daily 21 days on 7 days off started 08/25/2019 Switched to ribociclib s starting 08/22/2020   02/28/2021 -  Radiation Therapy   palliative radiation (Dr. Langston Masker in [place]) 02/28/2021 to right hip area   10/27/2021 -  Anti-estrogen oral therapy   Abemaciclib with fulvestrant   01/14/2022 Miscellaneous   Guardant360: ESR 1 mutation (no other mutations) MSI high was not detected, PTEN, T p53, EGFR amplification, RB1 mutations detected without any approved treatment options   Malignant neoplasm metastatic to liver (HCC)  08/09/2019 Initial Diagnosis   Malignant neoplasm metastatic to liver (HCC)     CHIEF COMPLIANT: Follow-up of scans and Enhertu  HISTORY OF PRESENT ILLNESS:   History of Present Illness   The patient, with a history of metastatic cancer, presents for Enhertu. She reports feeling well overall, but acknowledges that she has been on chemotherapy for over a year. Recent imaging shows slight progression of liver metastases, with slight increases in size of multiple lesions. Concurrently, her tumor marker has been climbing. The patient also has a history of a blood clot in her port, for which she has been  on warfarin for three months. She has an upcoming cardiology appointment due to a slight drop in heart  function, possibly related to her chemotherapy.         ALLERGIES:  is allergic to aspirin, latex, and nsaids.  MEDICATIONS:  Current Outpatient Medications  Medication Sig Dispense Refill   elacestrant hydrochloride (ORSERDU) 345 MG tablet Take 1 tablet (345 mg total) by mouth daily. Take with food. 30 tablet 3   carvedilol (COREG) 3.125 MG tablet TAKE 1 TABLET BY MOUTH TWICE A DAY WITH A MEAL 180 tablet 3   cholecalciferol (VITAMIN D3) 25 MCG (1000 UT) tablet Take 2 tablets (2,000 Units total) by mouth daily. (Patient not taking: Reported on 03/14/2023) 180 tablet 4   ezetimibe (ZETIA) 10 MG tablet Take 1 tablet (10 mg total) by mouth daily. 90 tablet 3   fam-trastuzumab deruxtecan-nxki (ENHERTU) 100 MG SOLR as directed Intravenous     gemfibrozil (LOPID) 600 MG tablet TAKE 1 TABLET (600 MG TOTAL) BY MOUTH 2 (TWO) TIMES DAILY BEFORE A MEAL. 180 tablet 2   lidocaine-prilocaine (EMLA) cream Apply to affected area once 30 g 3   loperamide (IMODIUM) 2 MG capsule Take 2 mg by mouth 4 (four) times daily as needed for diarrhea or loose stools. Reported on 11/30/2015     metFORMIN (GLUCOPHAGE-XR) 500 MG 24 hr tablet Take 1 tablet (500 mg total) by mouth daily with supper. Followed by [facility]  3   omeprazole (PRILOSEC OTC) 20 MG tablet Take 20 mg by mouth daily as needed (acid reflux).     ondansetron (ZOFRAN) 8 MG tablet Take 1 tablet (8 mg total) by mouth every 8 (eight) hours as needed for nausea or vomiting. Start on the third day after chemotherapy. 30 tablet 1   prochlorperazine (COMPAZINE) 10 MG tablet Take 1 tablet (10 mg total) by mouth every 6 (six) hours as needed for nausea or vomiting. 30 tablet 1   warfarin (COUMADIN) 5 MG tablet TAKE 1 TABLET (5 MG TOTAL) BY MOUTH DAILY. 90 tablet 1   No current facility-administered medications for this visit.    PHYSICAL EXAMINATION: ECOG PERFORMANCE STATUS: 1 - Symptomatic but completely ambulatory  Vitals:   06/06/23 1012  BP: 109/66   Pulse: (!) 104  Resp: 18  Temp: (!) 97.2 F (36.2 C)  SpO2: 100%   Filed Weights   06/06/23 1012  Weight: 138 lb 6.4 oz (62.8 kg)    Physical Exam   CARDIOVASCULAR: Ejection fraction 45-50%, decreased from previous 50-55%.        LABORATORY DATA:  I have reviewed the data as listed    Latest Ref Rng & Units 06/06/2023    9:42 AM 05/16/2023   11:26 AM 04/25/2023   10:01 AM  CMP  Glucose 70 - 99 mg/dL 82  914  72   BUN 8 - 23 mg/dL 18  23  20    Creatinine 0.44 - 1.00 mg/dL 7.82  9.56  2.13   Sodium 135 - 145 mmol/L 137  139  138   Potassium 3.5 - 5.1 mmol/L 3.8  3.7  3.4   Chloride 98 - 111 mmol/L 107  108  104   CO2 22 - 32 mmol/L 21  22  23    Calcium 8.9 - 10.3 mg/dL 08.6  57.8  46.9   Total Protein 6.5 - 8.1 g/dL 7.5  7.6  8.1   Total Bilirubin <1.2 mg/dL 0.3  0.3  0.3   Alkaline Phos 38 -  126 U/L 138  129  136   AST 15 - 41 U/L 17  17  16    ALT 0 - 44 U/L 7  8  7      Lab Results  Component Value Date   WBC 8.8 06/06/2023   HGB 12.7 06/06/2023   HCT 37.7 06/06/2023   MCV 95.9 06/06/2023   PLT 316 06/06/2023   NEUTROABS 6.5 06/06/2023    ASSESSMENT & PLAN:  Carcinoma of right breast metastatic to lung Metastatic breast cancer (pericardial effusion, pleural effusion, right middle lobe lung lesion, ER 91%, PR 90%, Ki-67 35%, HER2 negative diagnosed February 2023)   (Prior history of right mastectomy 2005, adjuvant chemo with Adriamycin Cytoxan and Taxol followed by tamoxifen)   Current treatment:  1. Ribociclib, fulvestrant, Xgeva, ribociclib discontinued 10/18/2021  2. abemaciclib started 10/27/2021 Foundation 1 in 2021: MSI stable, TMB 1, ESR 1 mutation, PTEN, FGF 10, RICTOR amplifications   Guardant360 01/14/2022: No actionable mutations.  ESR 1 mutation. Liver biopsy 02/26/2022: Metastatic carcinoma consistent with breast primary. Caris mutation testing 02/26/2022: HER2 low, ESR 1 mutation, PTEN   Treatment plan: Enhertu every 3 weeks, today cycle 16 She has  a puppy named Ricky Kizzie Ide Tzu 7 months old).   Enhertu toxicities: Fatigue that lasted for 4 to 5 days: We reduced the dosage of her chemotherapy with cycle 8 Nausea and vomiting: Much better controlled Intermittent diarrhea: Improved with Imodium Fatigue and loss of taste and appetite: Last for 1 week: Hopefully she will do better with lower dosage.   08/30/2022: CT chest: Stable moderate right pleural effusion with associated near complete collapse right lower lobe.  Unchanged bone metastases. 11/26/2022: CT CAP: No significant interval change.  Stable right-sided pleural effusion 5 mm lung nodule stable bone mets CA 27-29: 143 (was 695 on 05/23/2022)  03/06/2023: CT chest: Stable, small pleural effusion, stable tiny lung nodules, stable bone metastasis 03/06/2023: Liver MRI: Multiple hepatic metastases overall decreased in size 05/29/2023: MRI liver: Multiple liver lesions slightly increased in size and conspicuity.  Stable vertebral body metastasis benign left adrenal adenoma 06/05/2023: Unchanged moderate right pleural effusion  Discussion: With the increase in tumor marker CA 27-29 increasing to 240 and the slight progression noted on the liver MRI, I discussed with her about switching treatment to Elacestrant (prior ESR 1 mutation)  We will proceed with today's treatment and then switch her to Elacestrant as soon as she gets that medication    Decreased Cardiac Function Ejection fraction dropped from 50-55% to 45-50% on recent echocardiogram. No symptoms of heart failure. -Continue with planned cardiology appointment on 06/25/2023. -Expect heart function to recover after discontinuation of current chemotherapy.  Port Maintenance Port currently in place for IV chemotherapy, which will be discontinued. -Keep port in place and schedule flushes every 2 months in case IV treatment needed in future.  Anticoagulation for Port Clot On Warfarin for 3 months for clot in port. -Continue Warfarin  for a total of 6 months, then discontinue.          No orders of the defined types were placed in this encounter.  The patient has a good understanding of the overall plan. she agrees with it. she will call with any problems that may develop before the next visit here. Total time spent: 30 mins including face to face time and time spent for planning, charting and co-ordination of care   Tamsen Meek, MD 06/06/23

## 2023-06-09 ENCOUNTER — Telehealth: Payer: Self-pay | Admitting: Pharmacy Technician

## 2023-06-09 ENCOUNTER — Other Ambulatory Visit (HOSPITAL_COMMUNITY): Payer: Self-pay

## 2023-06-09 ENCOUNTER — Telehealth: Payer: Self-pay

## 2023-06-09 NOTE — Telephone Encounter (Addendum)
Oral Oncology Pharmacist Encounter  Received new prescription for Elacestrant (Orserdu) for the treatment of metastatic, HER2-, breast cancer with ESR1 mutation, planned duration until disease progression or unacceptable toxicity.  Labs from 06/06/23 assessed, Scr 1.18 (CrCl 42.72) all of there labs WNL. CBC WNL. Prescription dose and frequency assessed for appropriateness.   Current medication list in Epic reviewed, no significant/relevant DDIs with Elacestrant identified:  Evaluated chart and no patient barriers to medication adherence noted.   Patient agreement for treatment documented in MD note on 06/06/23.  Prescription has been e-scribed to the [facility] for benefits analysis and approval.  Oral Oncology Clinic will continue to follow for insurance authorization, copayment issues, initial counseling and start date.  Francetta Found, PharmD Candidate Class of 2025  06/09/2023 8:44 AM Oral Oncology Clinic [phone]

## 2023-06-09 NOTE — Telephone Encounter (Signed)
Oral Oncology Patient Advocate Encounter   Began application for assistance for Orserdu through [facility].  Reached out and spoke with patient regarding PAP paperwork, explained that I would send it to their preferred email via DocuSign.   Confirmed email address: [email or web address] .    Patient expressed understanding and consent.  Will follow up once paperwork has been signed and returned.  [employer] phone number [phone].   I will continue to check the status until final determination.   Jinger Neighbors, CPhT-Adv Oncology Pharmacy Patient Advocate [facility] Direct Number: [phone]  Fax: [phone]

## 2023-06-10 ENCOUNTER — Ambulatory Visit (HOSPITAL_COMMUNITY): Payer: Medicare Other | Admitting: Cardiology

## 2023-06-10 ENCOUNTER — Other Ambulatory Visit: Payer: Self-pay

## 2023-06-12 NOTE — Telephone Encounter (Signed)
Oral Oncology Patient Advocate Encounter   Submitted application for assistance for Orserdu to [employer].   Application submitted via e-fax to [facility] phone number [phone].   I will continue to check the status until final determination.   Barbara Parks, CPhT-Adv Oncology Pharmacy Patient [place] Direct Number: [phone]  Fax: [phone]

## 2023-06-13 ENCOUNTER — Other Ambulatory Visit: Payer: Self-pay

## 2023-06-13 NOTE — Telephone Encounter (Signed)
Oral Oncology Patient Advocate Encounter   Received notification that the application for assistance for Orserdu through [facility] has been approved.   [employer] phone number [phone].   Effective dates: 06/12/23 through 06/11/24  Jinger Neighbors, CPhT-Adv Oncology Pharmacy Patient Advocate [facility] Direct Number: [phone]  Fax: [phone]

## 2023-06-16 ENCOUNTER — Other Ambulatory Visit: Payer: Self-pay | Admitting: Pharmacist

## 2023-06-17 DIAGNOSIS — E1169 Type 2 diabetes mellitus with other specified complication: Secondary | ICD-10-CM | POA: Diagnosis not present

## 2023-06-17 DIAGNOSIS — G720 Drug-induced myopathy: Secondary | ICD-10-CM | POA: Diagnosis not present

## 2023-06-17 DIAGNOSIS — C7951 Secondary malignant neoplasm of bone: Secondary | ICD-10-CM | POA: Diagnosis not present

## 2023-06-17 DIAGNOSIS — C787 Secondary malignant neoplasm of liver and intrahepatic bile duct: Secondary | ICD-10-CM | POA: Diagnosis not present

## 2023-06-17 DIAGNOSIS — E559 Vitamin D deficiency, unspecified: Secondary | ICD-10-CM | POA: Diagnosis not present

## 2023-06-17 DIAGNOSIS — C50911 Malignant neoplasm of unspecified site of right female breast: Secondary | ICD-10-CM | POA: Diagnosis not present

## 2023-06-17 DIAGNOSIS — E785 Hyperlipidemia, unspecified: Secondary | ICD-10-CM | POA: Diagnosis not present

## 2023-06-17 DIAGNOSIS — Z0000 Encounter for general adult medical examination without abnormal findings: Secondary | ICD-10-CM | POA: Diagnosis not present

## 2023-06-17 DIAGNOSIS — Z1331 Encounter for screening for depression: Secondary | ICD-10-CM | POA: Diagnosis not present

## 2023-06-17 DIAGNOSIS — J449 Chronic obstructive pulmonary disease, unspecified: Secondary | ICD-10-CM | POA: Diagnosis not present

## 2023-06-17 DIAGNOSIS — N1831 Chronic kidney disease, stage 3a: Secondary | ICD-10-CM | POA: Diagnosis not present

## 2023-06-17 NOTE — Telephone Encounter (Signed)
Oral Chemotherapy Pharmacist Encounter   Patient will start Orserdu (elacestrant) on 06/18/2023   Patient Education I spoke with patient for overview of new oral chemotherapy medication: Orserdu (elacestrant) for the treatment of metastatic, HER2-, ER+, breast cancer with the ESR1 mutation, planned duration until disease progression or unacceptable drug toxicity.   Counseled patient on administration, dosing, side effects, monitoring, drug-food interactions, safe handling, storage, and disposal.   Patient will take Orserdu 345 mg tablets, 1 tablet (345 mg total) by mouth once daily with food.   Side effects include but not limited to: nausea/vomiting, musculoskeletal pain, changes in liver function tests, and fatigue.   Patient has anti-emetic on hand and knows to take it if nausea develops.     Reviewed with patient importance of keeping a medication schedule and plan for any missed doses.   After discussion no patient barriers to medication adherence identified.    Patient voiced understanding and appreciation. All questions answered. Medication handout provided.   Provided patient with Oral Chemotherapy Navigation Clinic phone number. Patient knows to call the office with questions or concerns. Oral Chemotherapy Navigation Clinic will continue to follow.  Bethel Born, PharmD Hematology/Oncology Clinical Pharmacist [facility]otherapy [facility] [phone]

## 2023-06-19 ENCOUNTER — Inpatient Hospital Stay: Payer: Medicare Other | Admitting: Pharmacist

## 2023-06-19 ENCOUNTER — Inpatient Hospital Stay: Payer: Medicare Other

## 2023-06-19 ENCOUNTER — Telehealth: Payer: Self-pay | Admitting: Pharmacist

## 2023-06-19 ENCOUNTER — Other Ambulatory Visit (HOSPITAL_COMMUNITY): Payer: Self-pay

## 2023-06-19 VITALS — BP 112/66 | HR 87 | Temp 97.8°F | Resp 18 | Ht 63.5 in

## 2023-06-19 DIAGNOSIS — C787 Secondary malignant neoplasm of liver and intrahepatic bile duct: Secondary | ICD-10-CM | POA: Diagnosis not present

## 2023-06-19 DIAGNOSIS — C50911 Malignant neoplasm of unspecified site of right female breast: Secondary | ICD-10-CM

## 2023-06-19 DIAGNOSIS — R978 Other abnormal tumor markers: Secondary | ICD-10-CM

## 2023-06-19 DIAGNOSIS — Z79899 Other long term (current) drug therapy: Secondary | ICD-10-CM | POA: Diagnosis not present

## 2023-06-19 DIAGNOSIS — Z95828 Presence of other vascular implants and grafts: Secondary | ICD-10-CM

## 2023-06-19 DIAGNOSIS — Z5112 Encounter for antineoplastic immunotherapy: Secondary | ICD-10-CM | POA: Diagnosis not present

## 2023-06-19 DIAGNOSIS — C7951 Secondary malignant neoplasm of bone: Secondary | ICD-10-CM

## 2023-06-19 DIAGNOSIS — I82B29 Chronic embolism and thrombosis of unspecified subclavian vein: Secondary | ICD-10-CM

## 2023-06-19 DIAGNOSIS — C50411 Malignant neoplasm of upper-outer quadrant of right female breast: Secondary | ICD-10-CM | POA: Diagnosis not present

## 2023-06-19 DIAGNOSIS — Z0000 Encounter for general adult medical examination without abnormal findings: Secondary | ICD-10-CM

## 2023-06-19 LAB — CMP (CANCER CENTER ONLY)
ALT: 7 U/L (ref 0–44)
AST: 17 U/L (ref 15–41)
Albumin: 4 g/dL (ref 3.5–5.0)
Alkaline Phosphatase: 134 U/L — ABNORMAL HIGH (ref 38–126)
Anion gap: 9 (ref 5–15)
BUN: 21 mg/dL (ref 8–23)
CO2: 22 mmol/L (ref 22–32)
Calcium: 9.9 mg/dL (ref 8.9–10.3)
Chloride: 107 mmol/L (ref 98–111)
Creatinine: 1.14 mg/dL — ABNORMAL HIGH (ref 0.44–1.00)
GFR, Estimated: 51 mL/min — ABNORMAL LOW (ref >60)
Glucose, Bld: 92 mg/dL (ref 70–99)
Potassium: 3.9 mmol/L (ref 3.5–5.1)
Sodium: 138 mmol/L (ref 135–145)
Total Bilirubin: 0.3 mg/dL (ref <1.2)
Total Protein: 7.2 g/dL (ref 6.5–8.1)

## 2023-06-19 LAB — CBC WITH DIFFERENTIAL (CANCER CENTER ONLY)
Abs Immature Granulocytes: 0.07 10*3/uL (ref 0.00–0.07)
Basophils Absolute: 0.1 10*3/uL (ref 0.0–0.1)
Basophils Relative: 1 %
Eosinophils Absolute: 0.2 10*3/uL (ref 0.0–0.5)
Eosinophils Relative: 2 %
HCT: 37.1 % (ref 36.0–46.0)
Hemoglobin: 12.1 g/dL (ref 12.0–15.0)
Immature Granulocytes: 1 %
Lymphocytes Relative: 6 %
Lymphs Abs: 0.6 10*3/uL — ABNORMAL LOW (ref 0.7–4.0)
MCH: 31.5 pg (ref 26.0–34.0)
MCHC: 32.6 g/dL (ref 30.0–36.0)
MCV: 96.6 fL (ref 80.0–100.0)
Monocytes Absolute: 1 10*3/uL (ref 0.1–1.0)
Monocytes Relative: 9 %
Neutro Abs: 9.3 10*3/uL — ABNORMAL HIGH (ref 1.7–7.7)
Neutrophils Relative %: 81 %
Platelet Count: 267 10*3/uL (ref 150–400)
RBC: 3.84 MIL/uL — ABNORMAL LOW (ref 3.87–5.11)
RDW: 15.7 % — ABNORMAL HIGH (ref 11.5–15.5)
WBC Count: 11.3 10*3/uL — ABNORMAL HIGH (ref 4.0–10.5)
nRBC: 0 % (ref 0.0–0.2)

## 2023-06-19 LAB — LIPID PANEL
Cholesterol: 242 mg/dL — ABNORMAL HIGH (ref 0–200)
HDL: 69 mg/dL (ref >40)
LDL Cholesterol: 149 mg/dL — ABNORMAL HIGH (ref 0–99)
Total CHOL/HDL Ratio: 3.5 {ratio}
Triglycerides: 121 mg/dL (ref <150)
VLDL: 24 mg/dL (ref 0–40)

## 2023-06-19 LAB — PROTIME-INR
INR: 1.6 — ABNORMAL HIGH (ref 0.8–1.2)
Prothrombin Time: 19.2 s — ABNORMAL HIGH (ref 11.4–15.2)

## 2023-06-19 MED ORDER — SODIUM CHLORIDE 0.9% FLUSH
10.0000 mL | Freq: Once | INTRAVENOUS | Status: AC
  Administered 2023-06-19: 10 mL

## 2023-06-19 MED ORDER — HEPARIN SOD (PORK) LOCK FLUSH 100 UNIT/ML IV SOLN
500.0000 [IU] | Freq: Once | INTRAVENOUS | Status: AC
  Administered 2023-06-19: 500 [IU]

## 2023-06-19 NOTE — Progress Notes (Signed)
[facility]       Telephone: [phone]?Fax: [phone]   Oncology Clinical Pharmacist Practitioner Initial Assessment  Barbara Parks is a 72 y.o. female with a diagnosis of breast cancer. They were contacted today via in-person visit.  Indication/Regimen Insurance claims handler (Orserdu) is being used appropriately for treatment of metastatic breast cancer by Dr. Serena Croissant.      Wt Readings from Last 1 Encounters:  06/06/23 138 lb 6.4 oz (62.8 kg)    Estimated body surface area is 1.68 meters squared as calculated from the following:   Height as of this encounter: 5' 3.5" (1.613 m).   Weight as of 06/06/23: 138 lb 6.4 oz (62.8 kg).  The dosing regimen is 345 mg by mouth daily on days 1 to 28 of a 28-day cycle. This is being given  monotherapy. It is planned to continue until disease progression or unacceptable toxicity. Prescription dose and frequency assessed for appropriateness.  Patient has agreed to treatment which is documented in physician note on 06/06/23. Counseled patient on administration, dosing, side effects, monitoring, drug-food interactions, safe handling, storage, and disposal.  Barbara Parks was seen today by clinical pharmacy to discuss elacestrant which was started yesterday. Her lipid panel and INR are not back yet as of this dictation. She is on warfarin for a port clot per Dr. [name] and will be on this for a couple more months. The DVT clinic will manage this going forward. Patient is aware of the potential interaction with gemfibrozil and warfarin. She has been on gemfibrozil for 12 hears per her report. She also saw her primary care practitioner, Chari Manning NP yesterday. She was told to stop metformin for now and monitor fasting blood sugars.  Dose Modifications No dose modifications per Dr. [name]   Access Assessment Barbara Parks will be receiving elacestrant through [facility] Concerns: using patient assistance  program Start date if known: 06/18/23  Adherence Assessment Reviewed importance on keeping a med schedule and plan for any missed doses Barriers to adherence identified? No  Allergies Allergies  Allergen Reactions   Aspirin     Upset stomach   Latex Other (See Comments)    Blistering and skin peels off   Nsaids Nausea And Vomiting    Extreme nausea and vomiting    Vitals    06/19/2023    2:27 PM 06/06/2023   11:00 AM 06/06/2023   10:12 AM  Oncology Vitals  Height 161 cm  161 cm  Weight   62.778 kg  Weight (lbs)   138 lbs 6 oz  BMI 24.13 kg/m2  24.13 kg/m2  Temp 97.8 F (36.6 C)  97.2 F (36.2 C)  Pulse Rate 87 98 104  BP 112/66  109/66  Resp 18  18  SpO2 97 %  100 %  BSA (m2) 1.68 m2  1.68 m2    Laboratory Data    Latest Ref Rng & Units 06/19/2023    1:46 PM 06/06/2023    9:42 AM 05/16/2023   11:26 AM  CBC EXTENDED  WBC 4.0 - 10.5 K/uL 11.3  8.8  8.3   RBC 3.87 - 5.11 MIL/uL 3.84  3.93  4.08   Hemoglobin 12.0 - 15.0 g/dL 29.5  62.1  30.8   HCT 36.0 - 46.0 % 37.1  37.7  39.8   Platelets 150 - 400 K/uL 267  316  329   NEUT# 1.7 - 7.7 K/uL 9.3  6.5  6.1   Lymph# 0.7 -  4.0 K/uL 0.6  0.7  0.7        Latest Ref Rng & Units 06/19/2023    1:46 PM 06/06/2023    9:42 AM 05/16/2023   11:26 AM  CMP  Glucose 70 - 99 mg/dL 92  82  573   BUN 8 - 23 mg/dL 21  18  23    Creatinine 0.44 - 1.00 mg/dL 2.20  2.54  2.70   Sodium 135 - 145 mmol/L 138  137  139   Potassium 3.5 - 5.1 mmol/L 3.9  3.8  3.7   Chloride 98 - 111 mmol/L 107  107  108   CO2 22 - 32 mmol/L 22  21  22    Calcium 8.9 - 10.3 mg/dL 9.9  62.3  76.2   Total Protein 6.5 - 8.1 g/dL 7.2  7.5  7.6   Total Bilirubin <1.2 mg/dL 0.3  0.3  0.3   Alkaline Phos 38 - 126 U/L 134  138  129   AST 15 - 41 U/L 17  17  17    ALT 0 - 44 U/L 7  7  8     No results found for: "MG" Lab Results  Component Value Date   CA2729 240.5 (H) 05/16/2023   CA2729 173.0 (H) 04/25/2023   CA2729 144.0 (H) 04/04/2023      Contraindications Contraindications were reviewed? Yes Contraindications to therapy were identified? No   Safety Precautions The following safety precautions for the use of elacestrant were reviewed:  Fever: reviewed the importance of having a thermometer and the Centers for Disease Control and Prevention (CDC) definition of fever which is 100.53F (38C) or higher. Patient should call 24/7 triage at [phone] if experiencing a fever or any other symptoms Muscle or joint pain or weakness Nausea or vomiting Increased cholesterol levels Handling body fluids and waste Pregnancy, sexual activity, and contraception Avoid grapefruit products Missed doses: If missed for more than 6 hours or vomiting occurs skip the dose and take the next dose the following day at next regularly scheduled time Storage and Handling  Medication Reconciliation Current Outpatient Medications  Medication Sig Dispense Refill   carvedilol (COREG) 3.125 MG tablet TAKE 1 TABLET BY MOUTH TWICE A DAY WITH A MEAL 180 tablet 3   cholecalciferol (VITAMIN D3) 25 MCG (1000 UT) tablet Take 2 tablets (2,000 Units total) by mouth daily. 180 tablet 4   elacestrant hydrochloride (ORSERDU) 345 MG tablet Take 1 tablet (345 mg total) by mouth daily. Take with food. 30 tablet 3   ezetimibe (ZETIA) 10 MG tablet Take 1 tablet (10 mg total) by mouth daily. 90 tablet 3   gemfibrozil (LOPID) 600 MG tablet TAKE 1 TABLET (600 MG TOTAL) BY MOUTH 2 (TWO) TIMES DAILY BEFORE A MEAL. 180 tablet 2   loperamide (IMODIUM) 2 MG capsule Take 2 mg by mouth 4 (four) times daily as needed for diarrhea or loose stools. Reported on 11/30/2015     omeprazole (PRILOSEC OTC) 20 MG tablet Take 20 mg by mouth daily as needed (acid reflux).     prochlorperazine (COMPAZINE) 10 MG tablet Take 10 mg by mouth every 6 (six) hours as needed for nausea or vomiting.     warfarin (COUMADIN) 5 MG tablet TAKE 1 TABLET (5 MG TOTAL) BY MOUTH DAILY. 90 tablet 1   metFORMIN  (GLUCOPHAGE-XR) 500 MG 24 hr tablet Take 1 tablet (500 mg total) by mouth daily with supper. Followed by [facility] (Patient taking differently: Take 500 mg by  mouth daily with supper. Followed by [facility]. On hold per Chari Manning NP on 06/18/23)  3   No current facility-administered medications for this visit.    Medication reconciliation is based on the patient's most recent medication list in the electronic medical record (EMR) including herbal products and OTC medications.   The patient's medication list was reviewed today with the patient? Yes   Drug-drug interactions (DDIs) DDIs were evaluated? Yes Significant DDIs identified?  As above, possible interaction with gemfibrozil and warfarin. Has been on gemfibrozil for years with no concerns with INR  Drug-Food Interactions Drug-food interactions were evaluated? Yes Drug-food interactions identified?  No, discussed importance of maintaining same diet as warfarin has known interactions with many foods.  Follow-up Plan  Patient education handout given to patient Continue elacestrant 345 mg by mouth daily Possibly restart denosumab 120 mg every 12 weeks for bone mets. Per patient, stopped by Dr. [name] when starting [facility] Periodically monitor lipids as patient already on cholesterol lowering agents. Elacestrant can cause hyperlipidemia. Baseline lipid panel not back yet Monitor CA 27.29 Continue to follow with Chari Manning NP for other comorbidities Ambulatory referral to DVT clinic to manage warfarin. Patient on this for port clot per Dr. Carmine Savoy flush labs, Dr. [name] visit on 07/14/23 Can see clinical pharmacy with port flush labs if needed after Dr. [name] visit if he so chooses.  Barbara Parks participated in the discussion, expressed understanding, and voiced agreement with the above plan. All questions were answered to her satisfaction. The patient was advised to contact the clinic at [phone] with  any questions or concerns prior to her return visit.   I spent 60 minutes assessing the patient.  Barbara Parks, PharmD, BCOP, CPP  Barbara Parks, RPH-CPP, 06/19/2023 2:49 PM  **Disclaimer: This note was dictated with voice recognition software. Similar sounding words can inadvertently be transcribed and this note may contain transcription errors which may not have been corrected upon publication of note.**

## 2023-06-19 NOTE — Telephone Encounter (Signed)
[facility]       Telephone: [phone]?Fax: [phone]   Oncology Clinical Pharmacist Practitioner Progress Note  Barbara Parks is a 72 y.o. female with a diagnosis of metastatic breast cancer currently on elacestrant under the care of Dr. Serena Croissant.   I connected with Barbara Parks today by telephone and verified that I was speaking with the correct person using two patient identifiers. I discussed the limitations, risks, security and privacy concerns of performing an evaluation and management service by telemedicine and the availability of in-person appointments. The patient/caregiver expressed understanding and agreed to proceed.  Other persons participating in the visit and their role in the encounter: none   Patient's location: home  Provider's location: clinic  Barbara Parks's INR came back after our visit today and it was 1.6. We discussed with Dr. [name] and he would like her to take 7.5 mg starting tomorrow (already took dose this am) for 3 days (Friday, Saturday, Sunday), then resume 5 mg on Monday until he sees Pervis Hocking CPP in DVT clinic. Referral sent today and aware of plan. This was communicated to Barbara Parks and she verbalized understanding of the plan.  Barbara Parks participated in the discussion, expressed understanding, and voiced agreement with the above plan. All questions were answered to her satisfaction. The patient was advised to contact the clinic at [phone] with any questions or concerns prior to her return visit.  Clinical pharmacy will continue to support Barbara Perna Riker and Dr. Serena Croissant as needed.  Elon Lomeli A. Odetta Pink, PharmD, BCOP, CPP  Anselm Lis, RPH-CPP,  06/19/2023  4:27 PM   **Disclaimer: This note was dictated with voice recognition software. Similar sounding words can inadvertently be transcribed and this note may contain transcription errors which may not have been corrected upon publication of  note.**

## 2023-06-20 LAB — CANCER ANTIGEN 27.29: CA 27.29: 221.5 U/mL — ABNORMAL HIGH (ref 0.0–38.6)

## 2023-06-23 NOTE — Progress Notes (Incomplete)
DVT Clinic Note  Name: Barbara Parks     MRN: [id number]     DOB: 08-12-1950     Sex: female  PCP: [name], FNP  Today's Visit: Visit Information: Initial Visit  Referred to DVT Clinic by:  Oncology  - Dr. [name] Referred to CPP by: Dr. Randie Heinz Reason for referral:  Chief Complaint  Patient presents with   Anticoagulation management   HISTORY OF PRESENT ILLNESS: Barbara Parks is a 72 y.o. female who presents after diagnosis of port thrombus for medication management. PMH is significant for breast cancer with metastasis to the lungs and liver. Her course was complicated by a nonocclusive thrombus in the right brachiocephalic vein around her chemo port on 03/06/23. CT report states this is unchanged from previous imaging dating back to December 2023. However, no clot was noted on any prior CT imaging so she was recommended to start on treatment with Xarelto. She was unable to pick up the medication due to cost. She has Medicare, but no part D plan so she is uninsured for medications. On 03/24/23 she was started on warfarin with the plan to treat for 6 months per Dr. [name]. This was being managed by oncology, but she was referred to DVT Clinic for assistance due to difficulty obtaining therapeutic INRs. Most recently on 06/19/23 her INR was subtherapeutic at 1.6. Oncology recommended she take warfarin 7.5 mg (1 and 1/2 tablets) daily Friday, Saturday, and Sunday then resume 5 mg (1 tablet) daily until her appointment in DVT Clinic.  Today patient reports she is doing well on warfarin. Denies abnormal bleeding or bruising. Denies missed doses and reports taking warfarin as instructed by oncology last week. [facility] is an hour drive from her home so its difficult for her to come in for frequent INR visits.   Positive Thrombotic Risk Factors: Active cancer, Central venous catheterization, Older Age Bleeding Risk Factors: Age >65 years, Anticoagulant therapy, Cancer  Negative Thrombotic Risk  Factors: Previous VTE, Recent trauma (within 3 months), Recent surgery (within 3 months), Recent admission to hospital with acute illness (within 3 months), Paralysis, paresis, or recent plaster cast immobilization of lower extremity, Bed rest >72 hours within 3 months, Sedentary journey lasting >8 hours within 4 weeks, Pregnancy, Within 6 weeks postpartum, Testosterone therapy, Estrogen therapy, Recent cesarean section (within 3 months), Erythropoiesis-stimulating agent, Non-malignant, chronic inflammatory condition, Known thrombophilic condition, Recent COVID diagnosis (within 3 months), Smoking, Obesity  Rx Insurance Coverage: Medicare but no part D plan so she is uninsured for medications Rx Affordability: Eliquis and Xarelto are unaffordable as she does not have prescription drug coverage Rx Assistance Provided:  Based on her stated income she should qualify for Eliquis patient assistance. We completed the application during the visit and it has been submitted.   Past Medical History:  Diagnosis Date   Arthritis    left hip   Asthma    breast ca 2005   breast/chemo R mastectomy   Breast cancer (HCC)    Dermatitis    Diabetes mellitus without complication (HCC)    GERD (gastroesophageal reflux disease)    Hypercholesteremia    Metastasis to lung (HCC) dx'd 08/2011   Osteopenia due to cancer therapy 09/09/2013   Palpitations    Personal history of chemotherapy    Shortness of breath     Past Surgical History:  Procedure Laterality Date   BREAST SURGERY  2005   right   CATARACT EXTRACTION W/PHACO Left 01/18/2014   Procedure: CATARACT  EXTRACTION PHACO AND INTRAOCULAR LENS PLACEMENT (IOC);  Surgeon: Loraine Leriche T. Nile Riggs, MD;  Location: [facility];  Service: Ophthalmology;  Laterality: Left;  CDE 15.79   CATARACT EXTRACTION W/PHACO Right 02/08/2014   Procedure: CATARACT EXTRACTION PHACO AND INTRAOCULAR LENS PLACEMENT (IOC);  Surgeon: Loraine Leriche T. Nile Riggs, MD;  Location: [facility];  Service: Ophthalmology;   Laterality: Right;  CDE 4.41   CHEST TUBE INSERTION  09/11/2011   Procedure: INSERTION PLEURAL DRAINAGE CATHETER;  Surgeon: Alleen Borne, MD;  Location: [facility] OR;  Service: Thoracic;  Laterality: Right;   IR IMAGING GUIDED PORT INSERTION  04/23/2022   MASTECTOMY Right    PERICARDIAL WINDOW  09/11/2011   Procedure: PERICARDIAL WINDOW;  Surgeon: Alleen Borne, MD;  Location: [facility] OR;  Service: Thoracic;  Laterality: N/A;   REMOVAL OF PLEURAL DRAINAGE CATHETER  12/19/2011   Procedure: REMOVAL OF PLEURAL DRAINAGE CATHETER;  Surgeon: Alleen Borne, MD;  Location: [facility] OR;  Service: Thoracic;  Laterality: Right;  TO BE DONE IN MINOR ROOM, SHORT STAY   SPINE SURGERY  1996   TOTAL HIP ARTHROPLASTY Left 06/07/2015   Procedure: LEFT TOTAL HIP ARTHROPLASTY ANTERIOR APPROACH;  Surgeon: Ollen Gross, MD;  Location: [facility];  Service: Orthopedics;  Laterality: Left;   VIDEO BRONCHOSCOPY  09/11/2011   Procedure: VIDEO BRONCHOSCOPY;  Surgeon: Alleen Borne, MD;  Location[facility];  Service: Thoracic;  Laterality: N/A;    Social History   Socioeconomic History   Marital status: Single    Spouse name: Not on file   Number of children: Not on file   Years of education: Not on file   Highest education level: Not on file  Occupational History   Not on file  Tobacco Use   Smoking status: Former    Current packs/day: 0.00    Average packs/day: 1.5 packs/day for 30.0 years (45.0 ttl pk-yrs)    Types: Cigarettes    Start date: 09/09/1977    Quit date: 09/10/2007    Years since quitting: 15.7   Smokeless tobacco: Never  Substance and Sexual Activity   Alcohol use: No   Drug use: No   Sexual activity: Not Currently  Other Topics Concern   Not on file  Social History Narrative   Not on file   Social Determinants of Health   Financial Resource Strain: Not on file  Food Insecurity: Not on file  Transportation Needs: Not on file  Physical Activity: Not on file  Stress: Not on file  Social Connections: Not on  file  Intimate Partner Violence: Not on file    Family History  Problem Relation Age of Onset   Cancer Mother        breast   Heart disease Father    Diabetes Other    Anesthesia problems Neg Hx     Allergies as of 06/24/2023 - Review Complete 06/24/2023  Allergen Reaction Noted   Aspirin     Latex Other (See Comments) 08/26/2011   Nsaids Nausea And Vomiting 08/26/2011    Current Outpatient Medications on File Prior to Encounter  Medication Sig Dispense Refill   carvedilol (COREG) 3.125 MG tablet TAKE 1 TABLET BY MOUTH TWICE A DAY WITH A MEAL 180 tablet 3   cholecalciferol (VITAMIN D3) 25 MCG (1000 UT) tablet Take 2 tablets (2,000 Units total) by mouth daily. 180 tablet 4   elacestrant hydrochloride (ORSERDU) 345 MG tablet Take 1 tablet (345 mg total) by mouth daily. Take with food. 30 tablet 3  ezetimibe (ZETIA) 10 MG tablet Take 1 tablet (10 mg total) by mouth daily. 90 tablet 3   gemfibrozil (LOPID) 600 MG tablet TAKE 1 TABLET (600 MG TOTAL) BY MOUTH 2 (TWO) TIMES DAILY BEFORE A MEAL. 180 tablet 2   loperamide (IMODIUM) 2 MG capsule Take 2 mg by mouth 4 (four) times daily as needed for diarrhea or loose stools. Reported on 11/30/2015     omeprazole (PRILOSEC OTC) 20 MG tablet Take 20 mg by mouth daily as needed (acid reflux).     prochlorperazine (COMPAZINE) 10 MG tablet Take 10 mg by mouth every 6 (six) hours as needed for nausea or vomiting.     warfarin (COUMADIN) 5 MG tablet TAKE 1 TABLET (5 MG TOTAL) BY MOUTH DAILY. 90 tablet 1   metFORMIN (GLUCOPHAGE-XR) 500 MG 24 hr tablet Take 1 tablet (500 mg total) by mouth daily with supper. Followed by [facility] (Patient not taking: Reported on 06/24/2023)  3   No current facility-administered medications on file prior to encounter.   REVIEW OF SYSTEMS:  Review of Systems  Respiratory:  Negative for shortness of breath.   Cardiovascular:  Negative for chest pain, palpitations and leg swelling.  Neurological:  Negative for  dizziness and tingling.   PHYSICAL EXAMINATION:  Vitals:   06/24/23 1335  BP: 110/74  Pulse: 93  SpO2: 97%   Physical Exam Cardiovascular:     Rate and Rhythm: Normal rate.  Pulmonary:     Effort: Pulmonary effort is normal.  Musculoskeletal:        General: No swelling or tenderness.  Psychiatric:        Mood and Affect: Mood normal.   Villalta Score for Post-Thrombotic Syndrome: Pain: Absent Cramps: Absent Heaviness: Absent Paresthesia: Absent Pruritus: Absent Pretibial Edema: Absent Skin Induration: Absent Hyperpigmentation: Absent Redness: Absent Venous Ectasia: Absent Pain on calf compression: Absent Villalta Preliminary Score: 0 Is venous ulcer present?: No If venous ulcer is present and score is <15, then 15 points total are assigned: Absent Villalta Total Score: 0  LABS:  CBC     Component Value Date/Time   WBC 11.3 (H) 06/19/2023 1346   WBC 10.0 02/26/2022 1123   RBC 3.84 (L) 06/19/2023 1346   HGB 12.1 06/19/2023 1346   HGB 14.1 07/08/2017 0942   HCT 37.1 06/19/2023 1346   HCT 42.9 07/08/2017 0942   PLT 267 06/19/2023 1346   PLT 283 07/08/2017 0942   MCV 96.6 06/19/2023 1346   MCV 81.1 07/08/2017 0942   MCH 31.5 06/19/2023 1346   MCHC 32.6 06/19/2023 1346   RDW 15.7 (H) 06/19/2023 1346   RDW 14.6 (H) 07/08/2017 0942   LYMPHSABS 0.6 (L) 06/19/2023 1346   LYMPHSABS 1.2 07/08/2017 0942   MONOABS 1.0 06/19/2023 1346   MONOABS 0.7 07/08/2017 0942   EOSABS 0.2 06/19/2023 1346   EOSABS 0.1 07/08/2017 0942   BASOSABS 0.1 06/19/2023 1346   BASOSABS 0.0 07/08/2017 0942    Hepatic Function      Component Value Date/Time   PROT 7.2 06/19/2023 1346   PROT 7.7 07/08/2017 0942   ALBUMIN 4.0 06/19/2023 1346   ALBUMIN 3.4 (L) 07/08/2017 0942   AST 17 06/19/2023 1346   AST 54 (H) 07/08/2017 0942   ALT 7 06/19/2023 1346   ALT 39 07/08/2017 0942   ALKPHOS 134 (H) 06/19/2023 1346   ALKPHOS 232 (H) 07/08/2017 0942   BILITOT 0.3 06/19/2023 1346    BILITOT 0.42 07/08/2017 0942   BILIDIR <0.1 (L) 11/30/2015  6010    Renal Function   Lab Results  Component Value Date   CREATININE 1.14 (H) 06/19/2023   CREATININE 1.18 (H) 06/06/2023   CREATININE 1.21 (H) 05/16/2023    CrCl cannot be calculated (Unknown ideal weight.).   [facility] Vascular Lab Studies:  CT Chest 03/03/23: FINDINGS: Cardiovascular: Right upper chest port. Tip of the catheter extends along the SVC radial junctional region. There is some surrounding thrombus which is nonocclusive within the right brachiocephalic vein, unchanged from previous in retrospect going back to at least December 2023.  ASSESSMENT: Location of DVT: Right upper extremity Cause of DVT: provoked by a transient risk factor - Patient with nonocclusive thrombus in the right brachiocephalic vein surrounding her chemo port. This was incidentally found on CT imaging on 03/06/23. CT report stated this was unchanged from prior imaging dating back to December 2023, however no clot was mentioned on prior imaging reports. Patient did not have any symptoms at the time. She was started on anticoagulation by Dr. [name] with the plan to treat for 6 months. She was prescribed Xarelto, but was unable to afford it since she does not have any prescription drug coverage. In light of this, oncology started her on warfarin with an INR goal of 2-3 and she has been tolerating this well. She has had her INR checked 3 times since August with only one reading in the therapeutic range so she was referred to DVT Clinic for assistance managing warfarin.   Today, her INR was therapeutic at 2.8 so we continued her current dose of 5 mg daily. Discussed with Marylynn Pearson CPP and Dr. [name] who would be okay switching warfarin to Eliquis if we are able to get coverage for her. Given her stated income, she should qualify for Eliquis patient assistance. We completed and submitted this application and will await a determination. We scheduled follow up  for next week to assess her INR and hopefully at that time we will have an update on her application. If approved, will plan to switch her to Eliquis to complete her 6 months of treatment as planned by Dr. [name]. She was in agreement with this plan.   PLAN: -Continue warfarin (Coumadin) 5 mg daily. -Expected duration of therapy: 6 months. Therapy started on 03/24/23. -Patient educated on purpose, proper use and potential adverse effects of warfarin (Coumadin). -Counseled on dietary and drug interactions of warfarin. -Discussed importance of taking medication around the same time every day. -Advised patient of medications to avoid (NSAIDs, aspirin doses >100 mg daily). -Educated that Tylenol (acetaminophen) is the preferred analgesic to lower the risk of bleeding. -Advised patient to alert all providers of anticoagulation therapy prior to starting a new medication or having a procedure. -Emphasized importance of monitoring for signs and symptoms of bleeding (abnormal bruising, prolonged bleeding, nose bleeds, bleeding from gums, discolored urine, black tarry stools). -Educated patient to present to the ED if emergent signs and symptoms of new thrombosis occur.  Follow up: DVT Clinic next Wednesday for INR check and plan for Eliquis transition if PAP is approved  Jarrett Ables, PharmD PGY-1 Pharmacy Resident  Pervis Hocking, PharmD, BCACP, CPP Deep Vein Thrombosis Clinic Clinical Pharmacist Practitioner Office: [phone]

## 2023-06-24 ENCOUNTER — Telehealth (HOSPITAL_COMMUNITY): Payer: Self-pay

## 2023-06-24 ENCOUNTER — Ambulatory Visit (HOSPITAL_COMMUNITY)
Admission: RE | Admit: 2023-06-24 | Discharge: 2023-06-24 | Disposition: A | Discharge status: Home / Self Care | Payer: Medicare Other | Source: Ambulatory Visit | Attending: Vascular Surgery | Admitting: Vascular Surgery

## 2023-06-24 VITALS — BP 110/74 | HR 93

## 2023-06-24 DIAGNOSIS — Z7901 Long term (current) use of anticoagulants: Secondary | ICD-10-CM | POA: Insufficient documentation

## 2023-06-24 DIAGNOSIS — I8290 Acute embolism and thrombosis of unspecified vein: Secondary | ICD-10-CM | POA: Diagnosis not present

## 2023-06-24 LAB — POCT INR: INR: 2.8 (ref 2.0–3.0)

## 2023-06-24 NOTE — Patient Instructions (Addendum)
-  Continue taking warfarin 5 mg daily -We will submit your Eliquis patient assistance application today  -It is important to take your medication around the same time every day.  -Avoid NSAIDs like ibuprofen (Advil, Motrin) and naproxen (Aleve) as well as aspirin doses over 100 mg daily. -Tylenol (acetaminophen) is the preferred over the counter pain medication to lower the risk of bleeding. -Be sure to alert all of your health care providers that you are taking an anticoagulant prior to starting a new medication or having a procedure. -Monitor for signs and symptoms of bleeding (abnormal bruising, prolonged bleeding, nose bleeds, bleeding from gums, discolored urine, black tarry stools). If you have fallen and hit your head OR if your bleeding is severe or not stopping, seek emergency care.  -Go to the emergency room if emergent signs and symptoms of new clot occur (new or worse swelling and pain in an arm or leg, shortness of breath, chest pain, fast or irregular heartbeats, lightheadedness, dizziness, fainting, coughing up blood) or if you experience a significant color change (pale or blue) in the extremity that has the DVT.   Your next visit is on December 4th at 1:30 PM.  [facility] [address] [place], [place], [place] [address] Enter the hospital through Entrance C off [facility] and pull up to the Heart [facility] Vascular Center entrance to the free valet parking.  Check in for your appointment at the Heart & Vascular Center.   If you have any questions or need to reschedule an appointment, please call [phone] [facility].  If you are having an emergency, call 911 or present to the nearest emergency room.   What is a DVT?  -Deep vein thrombosis (DVT) is a condition in which a blood clot forms in a vein of the deep venous system which can occur in the lower leg, thigh, pelvis, arm, or neck. This condition is serious and can be life-threatening if the clot travels to the  arteries of the lungs and causing a blockage (pulmonary embolism, PE). A DVT can also damage veins in the leg, which can lead to long-term venous disease, leg pain, swelling, discoloration, and ulcers or sores (post-thrombotic syndrome).  -Treatment may include taking an anticoagulant medication to prevent more clots from forming and the current clot from growing, wearing compression stockings, and/or surgical procedures to remove or dissolve the clot.

## 2023-06-24 NOTE — Telephone Encounter (Signed)
Patient Advocate Encounter  Application for Eliquis e-faxed to [employer] on 06/24/2023. Application form attached to patient chart.  Burnell Blanks, CPhT Rx Patient Advocate Phone: [phone]

## 2023-06-25 ENCOUNTER — Ambulatory Visit (HOSPITAL_COMMUNITY)
Admission: RE | Admit: 2023-06-25 | Discharge: 2023-06-25 | Disposition: A | Discharge status: Home / Self Care | Payer: Medicare Other | Source: Ambulatory Visit | Attending: Cardiology | Admitting: Cardiology

## 2023-06-25 ENCOUNTER — Encounter (HOSPITAL_COMMUNITY): Payer: Self-pay | Admitting: Cardiology

## 2023-06-25 VITALS — BP 104/66 | HR 101 | Wt 139.0 lb

## 2023-06-25 DIAGNOSIS — C7951 Secondary malignant neoplasm of bone: Secondary | ICD-10-CM | POA: Diagnosis not present

## 2023-06-25 DIAGNOSIS — R079 Chest pain, unspecified: Secondary | ICD-10-CM

## 2023-06-25 MED ORDER — LOSARTAN POTASSIUM 25 MG PO TABS: 12.5000 mg | ORAL_TABLET | Freq: Every day | ORAL | 3 refills | Status: DC

## 2023-06-25 NOTE — Progress Notes (Signed)
ADVANCED HEART FAILURE NEW PATIENT CLINIC NOTE  Referring Physician: [name], FNP  Primary Care: [name], FNP Primary Cardiologist:  HPI: Barbara Parks is a 72 y.o. female with a PMH of diabetes, stage IV R breast cancer metastatic to the lung and liver, prior smoker who presents for initial visit for further evaluation and treatment of heart failure/cardiomyopathy.      Oncology History  Carcinoma of right breast metastatic to lung (HCC)  10/10/2011 Initial Diagnosis   Carcinoma of right breast metastatic to lung (HCC)   05/02/2022 - 06/06/2023 Chemotherapy   Patient is on Treatment Plan : BREAST METASTATIC Fam-Trastuzumab Deruxtecan-nxki (Enhertu) (5.4) q21d     Malignant neoplasm of upper-outer quadrant of right breast in female, estrogen receptor positive (HCC)  03/22/2004 Initial Diagnosis   right mastectomy and sentinel lymph node sampling 03/22/2004 for a right upper-outer quadrant pT2 pN1, stage IIB invasive ductal carcinoma with lobular features, grade 2, estrogen receptor and progesterone receptor positive, HER-2 negative, with an MIB-1 of 9%   05/2004 - 09/18/2004 Chemotherapy   adjuvant chemotherapy with dose dense doxorubicin and cyclophosphamide x4 cycles (first cycle delayed one week) followed by dose dense paclitaxel x4 was completed 09/18/2004   09/2004 - 2009 Anti-estrogen oral therapy   tamoxifen started March 2006, discontinued 2009   08/2011 Relapse/Recurrence   Metastatic disease:large pericardial effusion, large right pleural effusion and possible right middle lobe bronchial obstruction  status post pericardial window placement, fiberoptic bronchoscopy and right Pleurx placement 09/11/2011, with biopsy of the bronchus intermedius and pericardium positive for metastatic breast cancer, ER 91%, PR 100% p with an MIB-1 of 35%, and no HER-2 amplification, ratio being 1.37   10/10/2011 - 07/2019 Anti-estrogen oral therapy   BOLERO-4 trial 10/10/2011,  receiving letrozole and everolimus (stopped for progression)   08/16/2019 Miscellaneous   foundation 1 requested on liver biopsy from 08/16/2019 shows a stable microsatellite status with 1 mutation/MB, amplification of C11orf30, FGF10 and RICTOR, with mutations in ESR1 and PTEN   08/16/2019 Miscellaneous   Liver biopsy: Metastatic carcinoma to the liver consistent with breast primary, ER 100%, PR 100%, Ki-67 5%, HER2 2+ by IHC, FISH HER2 negative   08/24/2019 -  Anti-estrogen oral therapy   fulvestrant started 08/24/2019, repeated every 4 weeks Palbociclib 125 mg daily 21 days on 7 days off started 08/25/2019 Switched to ribociclib s starting 08/22/2020   02/28/2021 -  Radiation Therapy   palliative radiation (Dr. Langston Masker in [place]) 02/28/2021 to right hip area   10/27/2021 -  Anti-estrogen oral therapy   Abemaciclib with fulvestrant   01/14/2022 Miscellaneous   Guardant360: ESR 1 mutation (no other mutations) MSI high was not detected, PTEN, T p53, EGFR amplification, RB1 mutations detected without any approved treatment options   Malignant neoplasm metastatic to liver (HCC)  08/09/2019 Initial Diagnosis   Malignant neoplasm metastatic to liver [facility])    Patient referred to HF clinic for evaluation following persistent drop in ejection fraction on hercepitin therapy. She has not been on medical therapy. She has a history of doxorubicin therapy (best estimate of total dose is 240mg /m2 given 4 cycles of dose dense therapy. No history of radiation to the breast.       SUBJECTIVE: Patient states that overall she is doing well.  She does have some shortness of breath with exertion but this has been stable for many years.  She denies any lower extremity swelling, abdominal swelling, PND, orthopnea.  Has been tolerating the start of carvedilol  well and has had no issues with the medication.  She was recently switched off her maintenance chemotherapy.  PMH, current medications, allergies, social  history, and family history reviewed in epic.  PHYSICAL EXAM: Vitals:   06/25/23 1136  BP: 104/66  Pulse: (!) 101  SpO2: 97%   GENERAL: Well nourished and in no apparent distress at rest.  HEENT: The mucous membranes are pink and moist.   PULM:  Normal work of breathing, clear to auscultation bilaterally. Respirations are unlabored.  CARDIAC:  JVP: Not elevated         Normal rate with regular rhythm. No murmurs, rubs or gallops.  Trace lower extremity edema.  ABDOMEN: Soft, non-tender, non-distended. NEUROLOGIC: Patient is oriented x3 with no focal or lateralizing neurologic deficits.  PSYCH: Patients affect is appropriate, there is no evidence of anxiety or depression.  SKIN: Warm and dry; no lesions or wounds. Warm and well perfused extremities.  DATA REVIEW  ECG: 05/2023: Normal sinus rhythm, low voltage EKG  ECHO: 07/2022: Normal EF, grade 1 diastolic dysfunction, no strain reported 10/2022: EF 50% 01/2023: EF 50 to 55%, LV longitudinal strain -12.5% 04/2023: EF 45 to 50%, global longitudinal strain -14.5%  CATH: None   ASSESSMENT & PLAN:  Abnormal echocardiogram in setting of long term chemotherapy use: Patient with abnormal echo, first noted in 10/2022.  Prior history of doxorubicin therapy in 2006 as well as ongoing Herceptin, recently changed to [employer].  Notable symptoms of heart failure, on carvedilol currently.  Borderline blood pressure, will start gentle ARB.  -Start losartan 12.5 mg daily -Blood work with chemotherapy -Continue carvedilol 3.125 mg twice a day -No need for diuretics -Continue maintenance chemotherapy -Repeat echocardiogram in 3 months -Consider biomarker evaluation if mild, chronic, stable shortness of breath persists -PFTs  Diabetes: No recent A1c. -Repeat at next visit, if still elevated would consider Jardiance.    Clearnce Hasten, MD Advanced Heart Failure Mechanical Circulatory Support 06/30/23

## 2023-06-25 NOTE — Patient Instructions (Signed)
Medication Changes:  START: LOSARTAN 12.5MG  ONCE DAILY  Testing/Procedures:  Your physician has requested that you have an echocardiogram IN 3 MONTHS. Echocardiography is a painless test that uses sound waves to create images of your heart. It provides your doctor with information about the size and shape of your heart and how well your heart's chambers and valves are working. This procedure takes approximately one hour. There are no restrictions for this procedure. Please do NOT wear cologne, perfume, aftershave, or lotions (deodorant is allowed). Please arrive 15 minutes prior to your appointment time.  Please note: We ask at that you not bring children with you during ultrasound (echo/ vascular) testing. Due to room size and safety concerns, children are not allowed in the ultrasound rooms during exams. Our front office staff cannot provide observation of children in our lobby area while testing is being conducted. An adult accompanying a patient to their appointment will only be allowed in the ultrasound room at the discretion of the ultrasound technician under special circumstances. We apologize for any inconvenience.  Follow-Up in: 3 MONTHS PLEASE CALL OUR OFFICE AROUND JANUARY TO GET SCHEDULED FOR YOUR APPOINTMENT. PHONE NUMBER IS [phone] OPTION 2   At the [facility], you and your health needs are our priority. We have a designated team specialized in the treatment of Heart Failure. This Care Team includes your primary Heart Failure Specialized Cardiologist (physician), Advanced Practice Providers (APPs- Physician Assistants and Nurse Practitioners), and Pharmacist who all work together to provide you with the care you need, when you need it.   You may see any of the following providers on your designated Care Team at your next follow up:  Dr. Arvilla Meres Dr. Marca Ancona Dr. Dorthula Nettles Dr. Theresia Bough Tonye Becket, NP Robbie Lis, [place] [facility] [place], [place] Brynda Peon, NP [place] Lee, NP Karle Plumber, PharmD   Please be sure to bring in all your medications bottles to every appointment.   Need to Contact us:  If you have any questions or concerns before your next appointment please send [place] a message through [place] or call our office at [phone].    TO LEAVE A MESSAGE FOR THE NURSE SELECT OPTION 2, PLEASE LEAVE A MESSAGE INCLUDING: YOUR NAME DATE OF BIRTH CALL BACK NUMBER REASON FOR CALL**this is important as we prioritize the call backs  YOU WILL RECEIVE A CALL BACK THE SAME DAY AS LONG AS YOU CALL BEFORE 4:00 PM

## 2023-06-25 NOTE — Telephone Encounter (Signed)
Patient was approved to receive Eliquis from [employer] Effective 06/25/2023 to 07/29/2023  Patient will receive a 3 month supply. Left voicemail for pt.

## 2023-07-02 ENCOUNTER — Ambulatory Visit (HOSPITAL_COMMUNITY)
Admission: RE | Admit: 2023-07-02 | Discharge: 2023-07-02 | Disposition: A | Discharge status: Home / Self Care | Payer: Medicare Other | Source: Ambulatory Visit | Attending: Surgery | Admitting: Surgery

## 2023-07-02 DIAGNOSIS — I8290 Acute embolism and thrombosis of unspecified vein: Secondary | ICD-10-CM | POA: Insufficient documentation

## 2023-07-02 MED ORDER — APIXABAN 5 MG PO TABS: 5.0000 mg | ORAL_TABLET | Freq: Two times a day (BID) | ORAL | Status: DC

## 2023-07-02 NOTE — Progress Notes (Cosign Needed)
DVT Clinic Note  Name: Barbara Parks     MRN: [id number]     DOB: 1951/05/03     Sex: female  PCP: [name], FNP  Today's Visit: Visit Information: Follow Up Visit  Referred to D[facility] Clinic by:  Oncology  - Dr. [name] Referred to CPP by: Dr. Myra Gianotti Reason for referral:  Chief Complaint  Patient presents with   Anticoagulation management   HISTORY OF PRESENT ILLNESS: Barbara Parks is a 72 y.o. female who presents for follow up medication management for port thrombus. PMH is significant for breast cancer with metastasis to the lungs and liver. Her course was complicated by a nonocclusive thrombus in the right brachiocephalic vein around her chemo port on 03/06/23. CT report states this is unchanged from previous imaging dating back to December 2023. However, no clot was noted on any prior CT imaging so she was recommended to start on treatment with Xarelto. She was unable to pick up the medication due to cost. She has Medicare, but no part D plan so she is uninsured for medications. On 03/24/23 she was started on anticoagulation with the plan to treat for 6 months per Dr. [name]. The initial plan was for DOAC but was too expensive so she was started on warfarin instead. This was being managed by oncology, but she was referred to D[facility] Clinic for assistance due to difficulty obtaining therapeutic INRs. Last seen in DVT Clinic 06/24/23 at which time her INR was therapeutic and we applied her for patient assistance for Eliquis, which was approved on 06/25/23, and we planned to switch her from warfarin to Eliquis at her next visit.   Today, patient reports that she received her shipment of Eliquis in the mail on Friday and started taking it on Saturday, with her last dose of warfarin being on Friday. Denies abnormal bleeding or bruising. Denies missed doses.   Positive Thrombotic Risk Factors: Active cancer, Central venous catheterization, Older Age Bleeding Risk Factors: Age >65 years,  Anticoagulant therapy, Cancer  Negative Thrombotic Risk Factors: Previous VTE, Recent surgery (within 3 months), Recent trauma (within 3 months), Recent admission to hospital with acute illness (within 3 months), Paralysis, paresis, or recent plaster cast immobilization of lower extremity, Bed rest >72 hours within 3 months, Sedentary journey lasting >8 hours within 4 weeks, Pregnancy, Within 6 weeks postpartum, Recent cesarean section (within 3 months), Estrogen therapy, Testosterone therapy, Erythropoiesis-stimulating agent, Recent COVID diagnosis (within 3 months), Non-malignant, chronic inflammatory condition, Known thrombophilic condition, Smoking, Obesity  Rx Insurance Coverage: Uninsured (Medicare A/B but no part D plan for medications) Rx Affordability: Cannot afford out of pocket costs of DOAC which is why she was started on warfarin, but she is now approved for patient assistance and receives Eliquis for free.  Rx Assistance Provided: Patient Assistance Program approved Preferred Pharmacy: She receives Eliquis in the mail from the manufacturer through the patient assistance program.   Past Medical History:  Diagnosis Date   Arthritis    left hip   Asthma    breast ca 2005   breast/chemo R mastectomy   Breast cancer (HCC)    Dermatitis    Diabetes mellitus without complication (HCC)    GERD (gastroesophageal reflux disease)    Hypercholesteremia    Metastasis to lung (HCC) dx'd 08/2011   Osteopenia due to cancer therapy 09/09/2013   Palpitations    Personal history of chemotherapy    Shortness of breath     Past Surgical History:  Procedure  Laterality Date   BREAST SURGERY  2005   right   CATARACT EXTRACTION W/PHACO Left 01/18/2014   Procedure: CATARACT EXTRACTION PHACO AND INTRAOCULAR LENS PLACEMENT (IOC);  Surgeon: Loraine Leriche T. Nile Riggs, MD;  Location: [facility];  Service: Ophthalmology;  Laterality: Left;  CDE 15.79   CATARACT EXTRACTION W/PHACO Right 02/08/2014   Procedure:  CATARACT EXTRACTION PHACO AND INTRAOCULAR LENS PLACEMENT (IOC);  Surgeon: Loraine Leriche T. Nile Riggs, MD;  Location: [facility];  Service: Ophthalmology;  Laterality: Right;  CDE 4.41   CHEST TUBE INSERTION  09/11/2011   Procedure: INSERTION PLEURAL DRAINAGE CATHETER;  Surgeon: Alleen Borne, MD;  Location: [facility];  Service: Thoracic;  Laterality: Right;   IR IMAGING GUIDED PORT INSERTION  04/23/2022   MASTECTOMY Right    PERICARDIAL WINDOW  09/11/2011   Procedure: PERICARDIAL WINDOW;  Surgeon: Alleen Borne, MD;  Location: [facility];  Service: Thoracic;  Laterality: N/A;   REMOVAL OF PLEURAL DRAINAGE CATHETER  12/19/2011   Procedure: REMOVAL OF PLEURAL DRAINAGE CATHETER;  Surgeon: Alleen Borne, MD;  Location: [facility];  Service: Thoracic;  Laterality: Right;  TO BE DONE IN MINOR ROOM, SHORT STAY   SPINE SURGERY  1996   TOTAL HIP ARTHROPLASTY Left 06/07/2015   Procedure: LEFT TOTAL HIP ARTHROPLASTY ANTERIOR APPROACH;  Surgeon: Ollen Gross, MD;  Location: [facility];  Service: Orthopedics;  Laterality: Left;   VIDEO BRONCHOSCOPY  09/11/2011   Procedure: VIDEO BRONCHOSCOPY;  Surgeon: Alleen Borne, MD;  Location: [facility] OR;  Service: Thoracic;  Laterality: N/A;    Social History   Socioeconomic History   Marital status: Single    Spouse name: Not on file   Number of children: Not on file   Years of education: Not on file   Highest education level: Not on file  Occupational History   Not on file  Tobacco Use   Smoking status: Former    Current packs/day: 0.00    Average packs/day: 1.5 packs/day for 30.0 years (45.0 ttl pk-yrs)    Types: Cigarettes    Start date: 09/09/1977    Quit date: 09/10/2007    Years since quitting: 15.8   Smokeless tobacco: Never  Substance and Sexual Activity   Alcohol use: No   Drug use: No   Sexual activity: Not Currently  Other Topics Concern   Not on file  Social History Narrative   Not on file   Social Determinants of Health   Financial Resource Strain: Not on file  Food  Insecurity: Not on file  Transportation Needs: Not on file  Physical Activity: Not on file  Stress: Not on file  Social Connections: Not on file  Intimate Partner Violence: Not on file    Family History  Problem Relation Age of Onset   Cancer Mother        breast   Heart disease Father    Diabetes Other    Anesthesia problems Neg Hx     Allergies as of 07/02/2023 - Review Complete 07/02/2023  Allergen Reaction Noted   Aspirin     Latex Other (See Comments) 08/26/2011   Nsaids Nausea And Vomiting 08/26/2011    Current Outpatient Medications on File Prior to Encounter  Medication Sig Dispense Refill   carvedilol (COREG) 3.125 MG tablet TAKE 1 TABLET BY MOUTH TWICE A DAY WITH A MEAL 180 tablet 3   cholecalciferol (VITAMIN D3) 25 MCG (1000 UT) tablet Take 2 tablets (2,000 Units total) by mouth daily. 180 tablet 4   elacestrant  hydrochloride (ORSERDU) 345 MG tablet Take 1 tablet (345 mg total) by mouth daily. Take with food. 30 tablet 3   ezetimibe (ZETIA) 10 MG tablet Take 1 tablet (10 mg total) by mouth daily. 90 tablet 3   gemfibrozil (LOPID) 600 MG tablet TAKE 1 TABLET (600 MG TOTAL) BY MOUTH 2 (TWO) TIMES DAILY BEFORE A MEAL. 180 tablet 2   loperamide (IMODIUM) 2 MG capsule Take 2 mg by mouth 4 (four) times daily as needed for diarrhea or loose stools. Reported on 11/30/2015     omeprazole (PRILOSEC OTC) 20 MG tablet Take 20 mg by mouth daily as needed (acid reflux).     prochlorperazine (COMPAZINE) 10 MG tablet Take 10 mg by mouth every 6 (six) hours as needed for nausea or vomiting.     losartan (COZAAR) 25 MG tablet Take 0.5 tablets (12.5 mg total) by mouth daily. (Patient not taking: Reported on 07/02/2023) 45 tablet 3   metFORMIN (GLUCOPHAGE-XR) 500 MG 24 hr tablet Take 1 tablet (500 mg total) by mouth daily with supper. Followed by [facility] (Patient not taking: Reported on 06/24/2023)  3   No current facility-administered medications on file prior to encounter.   LABS:   CBC     Component Value Date/Time   WBC 11.3 (H) 06/19/2023 1346   WBC 10.0 02/26/2022 1123   RBC 3.84 (L) 06/19/2023 1346   HGB 12.1 06/19/2023 1346   HGB 14.1 07/08/2017 0942   HCT 37.1 06/19/2023 1346   HCT 42.9 07/08/2017 0942   PLT 267 06/19/2023 1346   PLT 283 07/08/2017 0942   MCV 96.6 06/19/2023 1346   MCV 81.1 07/08/2017 0942   MCH 31.5 06/19/2023 1346   MCHC 32.6 06/19/2023 1346   RDW 15.7 (H) 06/19/2023 1346   RDW 14.6 (H) 07/08/2017 0942   LYMPHSABS 0.6 (L) 06/19/2023 1346   LYMPHSABS 1.2 07/08/2017 0942   MONOABS 1.0 06/19/2023 1346   MONOABS 0.7 07/08/2017 0942   EOSABS 0.2 06/19/2023 1346   EOSABS 0.1 07/08/2017 0942   BASOSABS 0.1 06/19/2023 1346   BASOSABS 0.0 07/08/2017 0942    Hepatic Function      Component Value Date/Time   PROT 7.2 06/19/2023 1346   PROT 7.7 07/08/2017 0942   ALBUMIN 4.0 06/19/2023 1346   ALBUMIN 3.4 (L) 07/08/2017 0942   AST 17 06/19/2023 1346   AST 54 (H) 07/08/2017 0942   ALT 7 06/19/2023 1346   ALT 39 07/08/2017 0942   ALKPHOS 134 (H) 06/19/2023 1346   ALKPHOS 232 (H) 07/08/2017 0942   BILITOT 0.3 06/19/2023 1346   BILITOT 0.42 07/08/2017 0942   BILIDIR <0.1 (L) 11/30/2015 0902    Renal Function   Lab Results  Component Value Date   CREATININE 1.14 (H) 06/19/2023   CREATININE 1.18 (H) 06/06/2023   CREATININE 1.21 (H) 05/16/2023    Estimated Creatinine Clearance: 37.7 mL/min (A) (by C-G formula based on SCr of 1.14 mg/dL (H)).   [facility] Vascular Lab Studies:  CT Chest 03/03/23: FINDINGS: Cardiovascular: Right upper chest port. Tip of the catheter extends along the SVC radial junctional region. There is some surrounding thrombus which is nonocclusive within the right brachiocephalic vein, unchanged from previous in retrospect going back to at least December 2023.  ASSESSMENT: Location of DVT: Right upper extremity Cause of DVT: provoked by a transient risk factor   Patient with nonocclusive thrombus in the right  brachiocephalic vein surrounding her chemo port. This was incidentally found on CT imaging on 03/06/23.  CT report stated this was unchanged from prior imaging dating back to December 2023, however no clot was mentioned on prior imaging reports. Patient did not have any symptoms at the time. She was started on anticoagulation by Dr. [name] with the plan to treat for 6 months. She was prescribed Xarelto, but was unable to afford it since she does not have any prescription drug coverage. In light of this, oncology started her on warfarin with an INR goal of 2-3. She has had her INR checked 3 times since August with only one reading in the therapeutic range so she was referred to [facility] Clinic for assistance managing warfarin.   Prior to her last visit, I discussed with Marylynn Pearson CPP and Dr. [name] who were okay switching her from warfarin to Eliquis if we were able to get coverage for her. Given her stated income, it seemed she would qualify for Eliquis patient assistance. We submitted it during her last visit on 06/24/23, it was approved on 06/25/23, and the shipment arrived at her house on 06/27/23. Since she had already taken warfarin that day, she self-discontinued warfarin and started Eliquis on 06/28/23. The approval and shipment arrived much more quickly than expected, so we had planned to check an INR at her visit today to ensure it was <2 prior to switching to Eliquis. Since she already stopped warfarin, started Eliquis, and tolerated this transition well, there is no need to check an INR today. No additional monitoring needed for Eliquis. Counseled patient extensively on Eliquis, and all of her questions have been answered at this time. She has three months remaining in her planned treatment, and she has received a 3 month supply of Eliquis.   PLAN: -Continue apixaban (Eliquis) 5 mg twice daily. -Expected duration of therapy: 6 months. Therapy started on 03/24/23. -Patient educated on purpose, proper use  and potential adverse effects of apixaban (Eliquis). -Discussed importance of taking medication around the same time every day. -Advised patient of medications to avoid (NSAIDs, aspirin doses >100 mg daily). -Educated that Tylenol (acetaminophen) is the preferred analgesic to lower the risk of bleeding. -Advised patient to alert all providers of anticoagulation therapy prior to starting a new medication or having a procedure. -Emphasized importance of monitoring for signs and symptoms of bleeding (abnormal bruising, prolonged bleeding, nose bleeds, bleeding from gums, discolored urine, black tarry stools). -Educated patient to present to the ED if emergent signs and symptoms of new thrombosis occur.  Follow up: with Dr. [name]. [facility] Clinic available as needed.   Pervis Hocking, PharmD, [place], CPP [facility] Clinical Pharmacist Practitioner Office: [phone]   I have evaluated the patient's chart/imaging and refer this patient to the Clinical Pharmacist Practitioner for medication management. I have reviewed the CPP's documentation and agree with her assessment and plan. I was immediately available during the visit for questions and collaboration.   Durene Cal, MD

## 2023-07-02 NOTE — Patient Instructions (Addendum)
-  Continue Eliquis 5 mg (1 tablet) twice daily.  -Follow up with Dr. [name].  -Please reach out here if you have any questions. We are happy to see you again here if needed.  -It is important to take your medication around the same time every day.  -Avoid NSAIDs like ibuprofen (Advil, Motrin) and naproxen (Aleve) as well as aspirin doses over 100 mg daily. -Tylenol (acetaminophen) is the preferred over the counter pain medication to lower the risk of bleeding. -Be sure to alert all of your health care providers that you are taking an anticoagulant prior to starting a new medication or having a procedure. -Monitor for signs and symptoms of bleeding (abnormal bruising, prolonged bleeding, nose bleeds, bleeding from gums, discolored urine, black tarry stools). If you have fallen and hit your head OR if your bleeding is severe or not stopping, seek emergency care.  -Go to the emergency room if emergent signs and symptoms of new clot occur (new or worse swelling and pain in an arm or leg, shortness of breath, chest pain, fast or irregular heartbeats, lightheadedness, dizziness, fainting, coughing up blood) or if you experience a significant color change (pale or blue) in the extremity that has the DVT.   If you have any questions or need to reschedule an appointment, please call [phone] [facility].  If you are having an emergency, call 911 or present to the nearest emergency room.

## 2023-07-14 ENCOUNTER — Inpatient Hospital Stay: Payer: Medicare Other | Attending: Oncology | Admitting: Hematology and Oncology

## 2023-07-14 ENCOUNTER — Inpatient Hospital Stay: Payer: Medicare Other | Attending: Hematology and Oncology

## 2023-07-14 ENCOUNTER — Other Ambulatory Visit: Payer: Self-pay | Admitting: *Deleted

## 2023-07-14 VITALS — BP 118/59 | HR 116 | Temp 97.7°F | Resp 18 | Ht 63.5 in | Wt 140.4 lb

## 2023-07-14 DIAGNOSIS — C50911 Malignant neoplasm of unspecified site of right female breast: Secondary | ICD-10-CM | POA: Diagnosis not present

## 2023-07-14 DIAGNOSIS — Z7901 Long term (current) use of anticoagulants: Secondary | ICD-10-CM | POA: Diagnosis not present

## 2023-07-14 DIAGNOSIS — C787 Secondary malignant neoplasm of liver and intrahepatic bile duct: Secondary | ICD-10-CM | POA: Diagnosis not present

## 2023-07-14 DIAGNOSIS — C7800 Secondary malignant neoplasm of unspecified lung: Secondary | ICD-10-CM | POA: Insufficient documentation

## 2023-07-14 DIAGNOSIS — I82B29 Chronic embolism and thrombosis of unspecified subclavian vein: Secondary | ICD-10-CM

## 2023-07-14 DIAGNOSIS — C7951 Secondary malignant neoplasm of bone: Secondary | ICD-10-CM | POA: Diagnosis not present

## 2023-07-14 DIAGNOSIS — Z170 Estrogen receptor positive status [ER+]: Secondary | ICD-10-CM | POA: Insufficient documentation

## 2023-07-14 DIAGNOSIS — Z79899 Other long term (current) drug therapy: Secondary | ICD-10-CM | POA: Diagnosis not present

## 2023-07-14 DIAGNOSIS — C50411 Malignant neoplasm of upper-outer quadrant of right female breast: Secondary | ICD-10-CM | POA: Insufficient documentation

## 2023-07-14 DIAGNOSIS — Z9011 Acquired absence of right breast and nipple: Secondary | ICD-10-CM | POA: Insufficient documentation

## 2023-07-14 DIAGNOSIS — Z95828 Presence of other vascular implants and grafts: Secondary | ICD-10-CM

## 2023-07-14 DIAGNOSIS — R978 Other abnormal tumor markers: Secondary | ICD-10-CM

## 2023-07-14 LAB — CBC WITH DIFFERENTIAL (CANCER CENTER ONLY)
Abs Immature Granulocytes: 0.07 10*3/uL (ref 0.00–0.07)
Basophils Absolute: 0.1 10*3/uL (ref 0.0–0.1)
Basophils Relative: 1 %
Eosinophils Absolute: 0.2 10*3/uL (ref 0.0–0.5)
Eosinophils Relative: 2 %
HCT: 40.4 % (ref 36.0–46.0)
Hemoglobin: 13.1 g/dL (ref 12.0–15.0)
Immature Granulocytes: 1 %
Lymphocytes Relative: 7 %
Lymphs Abs: 0.8 10*3/uL (ref 0.7–4.0)
MCH: 31 pg (ref 26.0–34.0)
MCHC: 32.4 g/dL (ref 30.0–36.0)
MCV: 95.5 fL (ref 80.0–100.0)
Monocytes Absolute: 1.3 10*3/uL — ABNORMAL HIGH (ref 0.1–1.0)
Monocytes Relative: 10 %
Neutro Abs: 9.9 10*3/uL — ABNORMAL HIGH (ref 1.7–7.7)
Neutrophils Relative %: 79 %
Platelet Count: 283 10*3/uL (ref 150–400)
RBC: 4.23 MIL/uL (ref 3.87–5.11)
RDW: 14.8 % (ref 11.5–15.5)
WBC Count: 12.4 10*3/uL — ABNORMAL HIGH (ref 4.0–10.5)
nRBC: 0 % (ref 0.0–0.2)

## 2023-07-14 LAB — CMP (CANCER CENTER ONLY)
ALT: 6 U/L (ref 0–44)
AST: 15 U/L (ref 15–41)
Albumin: 4.1 g/dL (ref 3.5–5.0)
Alkaline Phosphatase: 123 U/L (ref 38–126)
Anion gap: 8 (ref 5–15)
BUN: 24 mg/dL — ABNORMAL HIGH (ref 8–23)
CO2: 23 mmol/L (ref 22–32)
Calcium: 10.1 mg/dL (ref 8.9–10.3)
Chloride: 106 mmol/L (ref 98–111)
Creatinine: 1.36 mg/dL — ABNORMAL HIGH (ref 0.44–1.00)
GFR, Estimated: 41 mL/min — ABNORMAL LOW (ref >60)
Glucose, Bld: 104 mg/dL — ABNORMAL HIGH (ref 70–99)
Potassium: 4.1 mmol/L (ref 3.5–5.1)
Sodium: 137 mmol/L (ref 135–145)
Total Bilirubin: 0.3 mg/dL (ref <1.2)
Total Protein: 7.2 g/dL (ref 6.5–8.1)

## 2023-07-14 MED ORDER — SODIUM CHLORIDE 0.9% FLUSH
10.0000 mL | Freq: Once | INTRAVENOUS | Status: AC
  Administered 2023-07-14: 10 mL

## 2023-07-14 MED ORDER — HEPARIN SOD (PORK) LOCK FLUSH 100 UNIT/ML IV SOLN
500.0000 [IU] | Freq: Once | INTRAVENOUS | Status: AC
  Administered 2023-07-14: 500 [IU]

## 2023-07-14 NOTE — Assessment & Plan Note (Addendum)
Metastatic breast cancer (pericardial effusion, pleural effusion, right middle lobe lung lesion, ER 91%, PR 90%, Ki-67 35%, HER2 negative diagnosed February 2023)   (Prior history of right mastectomy 2005, adjuvant chemo with Adriamycin Cytoxan and Taxol followed by tamoxifen)   Current treatment:  1. Ribociclib, fulvestrant, Xgeva, ribociclib discontinued 10/18/2021  2. abemaciclib started 10/27/2021 Foundation 1 in 2021: MSI stable, TMB 1, ESR 1 mutation, PTEN, FGF 10, RICTOR amplifications   Guardant360 01/14/2022: No actionable mutations.  ESR 1 mutation. Liver biopsy 02/26/2022: Metastatic carcinoma consistent with breast primary. Caris mutation testing 02/26/2022: HER2 low, ESR 1 mutation, PTEN Enhertu discontinued for progression after 16 cycles on 06/06/2023  Treatment plan: Elacestrant started 06/18/2023 She has a puppy named Ricky Kizzie Ide Tzu 7 months old).    08/30/2022: CT chest: Stable moderate right pleural effusion with associated near complete collapse right lower lobe.  Unchanged bone metastases. 11/26/2022: CT CAP: No significant interval change.  Stable right-sided pleural effusion 5 mm lung nodule stable bone mets CA 27-29: 143 (was 695 on 05/23/2022)  03/06/2023: CT chest: Stable, small pleural effusion, stable tiny lung nodules, stable bone metastasis 03/06/2023: Liver MRI: Multiple hepatic metastases overall decreased in size 05/29/2023: MRI liver: Multiple liver lesions slightly increased in size and conspicuity.  Stable vertebral body metastasis benign left adrenal adenoma 06/05/2023: Unchanged moderate right pleural effusion  Elacestrant toxicities:  CA 27-29 showed an improvement after 1 month on Elacestrant.  (240 to 221) Port Blood clot: Currently on anticoagulation with Eliquis   Decreased Cardiac Function Ejection fraction dropped from 50-55% to 45-50% on recent echocardiogram. No symptoms of heart failure.  Seeing cardiology.  Return to clinic with labs in 1 month

## 2023-07-14 NOTE — Progress Notes (Signed)
Patient Care Team: [name], FNP as PCP - General (Family Medicine) Maisie Fus, MD as PCP - Cardiology (Cardiology) Jake Bathe, MD (Cardiology) Alleen Borne, MD (Cardiothoracic Surgery) Anselm Lis, RPH-CPP (Pharmacist) Nils Pyle, MD as Referring Physician (Radiation Oncology) Serena Croissant, MD as Consulting Physician (Hematology and Oncology)  DIAGNOSIS:  Encounter Diagnosis  Name Primary?   Carcinoma of right breast metastatic to lung (HCC) Yes    SUMMARY OF ONCOLOGIC HISTORY: Oncology History  Carcinoma of right breast metastatic to lung (HCC)  10/10/2011 Initial Diagnosis   Carcinoma of right breast metastatic to lung (HCC)   05/02/2022 - 06/06/2023 Chemotherapy   Patient is on Treatment Plan : BREAST METASTATIC Fam-Trastuzumab Deruxtecan-nxki (Enhertu) (5.4) q21d     Malignant neoplasm of upper-outer quadrant of right breast in female, estrogen receptor positive (HCC)  03/22/2004 Initial Diagnosis   right mastectomy and sentinel lymph node sampling 03/22/2004 for a right upper-outer quadrant pT2 pN1, stage IIB invasive ductal carcinoma with lobular features, grade 2, estrogen receptor and progesterone receptor positive, HER-2 negative, with an MIB-1 of 9%   05/2004 - 09/18/2004 Chemotherapy   adjuvant chemotherapy with dose dense doxorubicin and cyclophosphamide x4 cycles (first cycle delayed one week) followed by dose dense paclitaxel x4 was completed 09/18/2004   09/2004 - 2009 Anti-estrogen oral therapy   tamoxifen started March 2006, discontinued 2009   08/2011 Relapse/Recurrence   Metastatic disease:large pericardial effusion, large right pleural effusion and possible right middle lobe bronchial obstruction  status post pericardial window placement, fiberoptic bronchoscopy and right Pleurx placement 09/11/2011, with biopsy of the bronchus intermedius and pericardium positive for metastatic breast cancer, ER 91%, PR 100% p with an MIB-1 of 35%,  and no HER-2 amplification, ratio being 1.37   10/10/2011 - 07/2019 Anti-estrogen oral therapy   BOLERO-4 trial 10/10/2011, receiving letrozole and everolimus (stopped for progression)   08/16/2019 Miscellaneous   foundation 1 requested on liver biopsy from 08/16/2019 shows a stable microsatellite status with 1 mutation/MB, amplification of C11orf30, FGF10 and RICTOR, with mutations in ESR1 and PTEN   08/16/2019 Miscellaneous   Liver biopsy: Metastatic carcinoma to the liver consistent with breast primary, ER 100%, PR 100%, Ki-67 5%, HER2 2+ by IHC, FISH HER2 negative   08/24/2019 -  Anti-estrogen oral therapy   fulvestrant started 08/24/2019, repeated every 4 weeks Palbociclib 125 mg daily 21 days on 7 days off started 08/25/2019 Switched to ribociclib s starting 08/22/2020   02/28/2021 -  Radiation Therapy   palliative radiation (Dr. Langston Masker in [place]) 02/28/2021 to right hip area   10/27/2021 -  Anti-estrogen oral therapy   Abemaciclib with fulvestrant   01/14/2022 Miscellaneous   Guardant360: ESR 1 mutation (no other mutations) MSI high was not detected, PTEN, T p53, EGFR amplification, RB1 mutations detected without any approved treatment options   Malignant neoplasm metastatic to liver (HCC)  08/09/2019 Initial Diagnosis   Malignant neoplasm metastatic to liver (HCC)     CHIEF COMPLIANT: Follow-up of metastatic breast cancer and Elacestrant  HISTORY OF PRESENT ILLNESS:   History of Present Illness   The patient, with a history of Met breast cancer on elacestrant, presents with increased lower back and hip pain. She reports that the pain is not debilitating but has increased in frequency from a couple of times a week to daily. She also reports experiencing acid reflux since starting a new treatment four weeks ago. The reflux was initially bothersome, causing nausea, but has been well managed  with Prilosec and eating with meals. The patient also reports occasional hot flashes, particularly  at night. She has a port that is flushed every six weeks and is currently taking Eliquis, no longer requiring INR monitoring.         ALLERGIES:  is allergic to aspirin, latex, and nsaids.  MEDICATIONS:  Current Outpatient Medications  Medication Sig Dispense Refill   apixaban (ELIQUIS) 5 MG TABS tablet Take 1 tablet (5 mg total) by mouth 2 (two) times daily. Receives in the mail from [facility].     carvedilol (COREG) 3.125 MG tablet TAKE 1 TABLET BY MOUTH TWICE A DAY WITH A MEAL 180 tablet 3   cholecalciferol (VITAMIN D3) 25 MCG (1000 UT) tablet Take 2 tablets (2,000 Units total) by mouth daily. 180 tablet 4   elacestrant hydrochloride (ORSERDU) 345 MG tablet Take 1 tablet (345 mg total) by mouth daily. Take with food. 30 tablet 3   ezetimibe (ZETIA) 10 MG tablet Take 1 tablet (10 mg total) by mouth daily. 90 tablet 3   gemfibrozil (LOPID) 600 MG tablet TAKE 1 TABLET (600 MG TOTAL) BY MOUTH 2 (TWO) TIMES DAILY BEFORE A MEAL. 180 tablet 2   loperamide (IMODIUM) 2 MG capsule Take 2 mg by mouth 4 (four) times daily as needed for diarrhea or loose stools. Reported on 11/30/2015     losartan (COZAAR) 25 MG tablet Take 0.5 tablets (12.5 mg total) by mouth daily. (Patient not taking: Reported on 07/02/2023) 45 tablet 3   metFORMIN (GLUCOPHAGE-XR) 500 MG 24 hr tablet Take 1 tablet (500 mg total) by mouth daily with supper. Followed by [facility] (Patient not taking: Reported on 06/24/2023)  3   omeprazole (PRILOSEC OTC) 20 MG tablet Take 20 mg by mouth daily as needed (acid reflux).     prochlorperazine (COMPAZINE) 10 MG tablet Take 10 mg by mouth every 6 (six) hours as needed for nausea or vomiting.     No current facility-administered medications for this visit.    PHYSICAL EXAMINATION: ECOG PERFORMANCE STATUS: 1 - Symptomatic but completely ambulatory  Vitals:   07/14/23 1438  BP: (!) 118/59  Pulse: (!) 116  Resp: 18  Temp: 97.7 F (36.5 C)  SpO2: 96%   Filed Weights    07/14/23 1438  Weight: 140 lb 6.4 oz (63.7 kg)     LABORATORY DATA:  I have reviewed the data as listed    Latest Ref Rng & Units 07/14/2023    2:08 PM 06/19/2023    1:46 PM 06/06/2023    9:42 AM  CMP  Glucose 70 - 99 mg/dL 098  92  82   BUN 8 - 23 mg/dL 24  21  18    Creatinine 0.44 - 1.00 mg/dL 1.19  1.47  8.29   Sodium 135 - 145 mmol/L 137  138  137   Potassium 3.5 - 5.1 mmol/L 4.1  3.9  3.8   Chloride 98 - 111 mmol/L 106  107  107   CO2 22 - 32 mmol/L 23  22  21    Calcium 8.9 - 10.3 mg/dL 56.2  9.9  13.0   Total Protein 6.5 - 8.1 g/dL 7.2  7.2  7.5   Total Bilirubin <1.2 mg/dL 0.3  0.3  0.3   Alkaline Phos 38 - 126 U/L 123  134  138   AST 15 - 41 U/L 15  17  17    ALT 0 - 44 U/L 6  7  7  Lab Results  Component Value Date   WBC 12.4 (H) 07/14/2023   HGB 13.1 07/14/2023   HCT 40.4 07/14/2023   MCV 95.5 07/14/2023   PLT 283 07/14/2023   NEUTROABS 9.9 (H) 07/14/2023    ASSESSMENT & PLAN:  Carcinoma of right breast metastatic to lung Metastatic breast cancer (pericardial effusion, pleural effusion, right middle lobe lung lesion, ER 91%, PR 90%, Ki-67 35%, HER2 negative diagnosed February 2023)   (Prior history of right mastectomy 2005, adjuvant chemo with Adriamycin Cytoxan and Taxol followed by tamoxifen)   Current treatment:  1. Ribociclib, fulvestrant, Xgeva, ribociclib discontinued 10/18/2021  2. abemaciclib started 10/27/2021 Foundation 1 in 2021: MSI stable, TMB 1, ESR 1 mutation, PTEN, FGF 10, RICTOR amplifications   Guardant360 01/14/2022: No actionable mutations.  ESR 1 mutation. Liver biopsy 02/26/2022: Metastatic carcinoma consistent with breast primary. Caris mutation testing 02/26/2022: HER2 low, ESR 1 mutation, PTEN Enhertu discontinued for progression after 16 cycles on 06/06/2023  Treatment plan: Elacestrant started 06/18/2023 She has a puppy named Ricky Kizzie Ide Tzu 7 months old).    08/30/2022: CT chest: Stable moderate right pleural effusion with  associated near complete collapse right lower lobe.  Unchanged bone metastases. 11/26/2022: CT CAP: No significant interval change.  Stable right-sided pleural effusion 5 mm lung nodule stable bone mets CA 27-29: 143 (was 695 on 05/23/2022)  03/06/2023: CT chest: Stable, small pleural effusion, stable tiny lung nodules, stable bone metastasis 03/06/2023: Liver MRI: Multiple hepatic metastases overall decreased in size 05/29/2023: MRI liver: Multiple liver lesions slightly increased in size and conspicuity.  Stable vertebral body metastasis benign left adrenal adenoma 06/05/2023: Unchanged moderate right pleural effusion  Elacestrant toxicities:  CA 27-29 showed an improvement after 1 month on Elacestrant.  (240 to 221) Port Blood clot: Currently on anticoagulation with Eliquis   Decreased Cardiac Function Ejection fraction dropped from 50-55% to 45-50% on recent echocardiogram. No symptoms of heart failure.  Seeing cardiology.  Return to clinic with labs in 1 month        Orders Placed This Encounter  Procedures   CBC with Differential (Cancer Center Only)    Standing Status:   Future    Expiration Date:   07/13/2024   CMP (Cancer Center only)    Standing Status:   Future    Expiration Date:   07/13/2024   The patient has a good understanding of the overall plan. she agrees with it. she will call with any problems that may develop before the next visit here. Total time spent: 30 mins including face to face time and time spent for planning, charting and co-ordination of care   Tamsen Meek, MD 07/14/23

## 2023-07-15 LAB — CANCER ANTIGEN 27.29: CA 27.29: 198.9 U/mL — ABNORMAL HIGH (ref 0.0–38.6)

## 2023-07-26 IMAGING — MR MR ABDOMEN WO/W CM
17 series · 47 of 48 positions shown · IV contrast (multihance)
Comparison: Multiple priors, most recently abdominal MRI
10/12/2021.

CLINICAL DATA: 71-year-old female with history of invasive stage IV
breast cancer with metastatic disease to the liver. Evaluate for
treatment response.

EXAM:
MRI ABDOMEN WITHOUT AND WITH CONTRAST
TECHNIQUE: Multiplanar multisequence MR imaging of the abdomen was performed
both before and after the administration of intravenous contrast.
CONTRAST:  14mL MULTIHANCE GADOBENATE DIMEGLUMINE 529 MG/ML IV SOLN

[Series 3: T2 · coronal · 5.0mm · 1.56mm/px · 2 of 33 slices shown (1 of 3)]
[im 1/33]
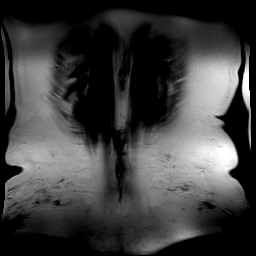
[im 33/33]
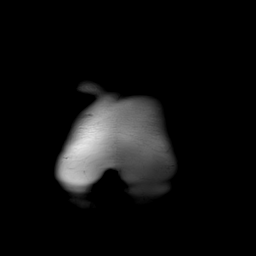

[Series 4: T1 · axial · 3.0mm · 1.19mm/px · z∈[-141,+72]mm · 6 of 144 slices shown]
[im 1/144]
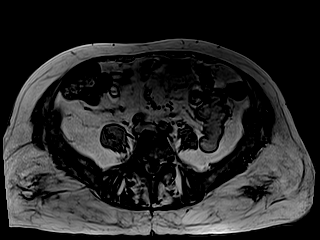
[im 29/144]
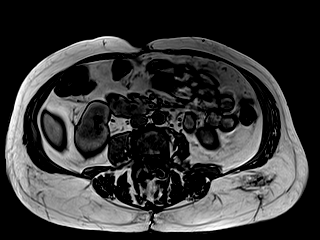
[im 58/144]
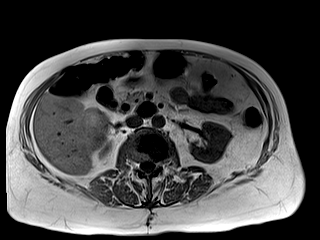
[im 86/144]
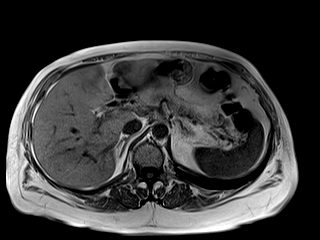
[im 115/144]
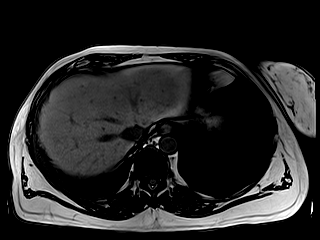
[im 144/144]
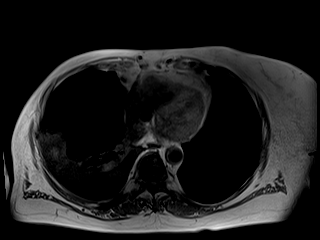

[Series 5: bSSFP · axial · 5.0mm · 1.25mm/px · z∈[-139,+71]mm · 2 of 36 slices shown]
[im 1/36]
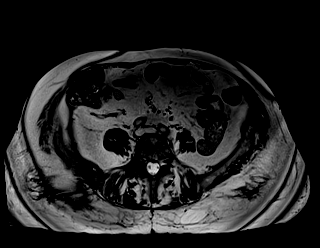
[im 36/36]
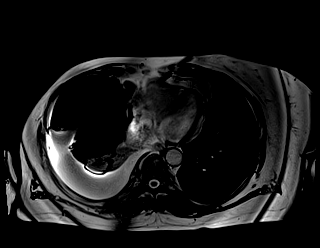

[Series 6: T2 · axial · 5.0mm · 1.48mm/px · z∈[-151,+83]mm · 2 of 40 slices shown (2 of 3)]
[im 1/40]
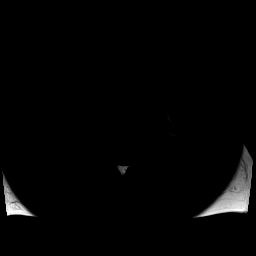
[im 40/40]
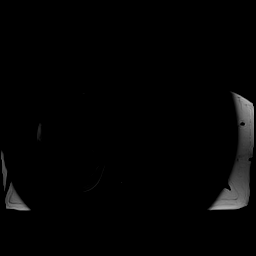

[Series 7: DWI · axial · 5.0mm · 1.42mm/px · z∈[-121,+71]mm · 4 of 99 slices shown (1 of 2)]
[im 1/99]
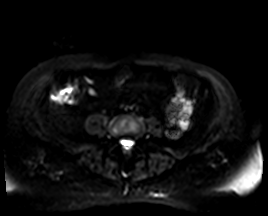
[im 33/99]
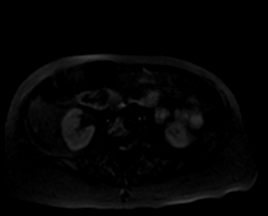
[im 66/99]
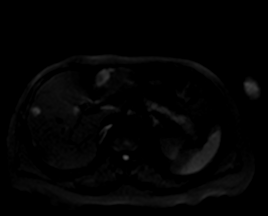
[im 99/99]
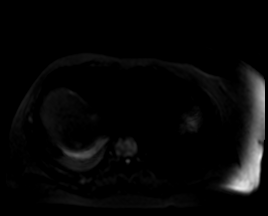

[Series 8: DWI · axial · 5.0mm · 1.42mm/px · 1 of 33 slices shown (2 of 2)]
[im 1/33]
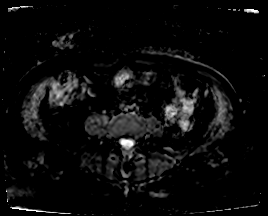

[Series 9: T2 · axial · 6.0mm · 1.19mm/px · 1 of 30 slices shown (3 of 3)]
[im 1/30]
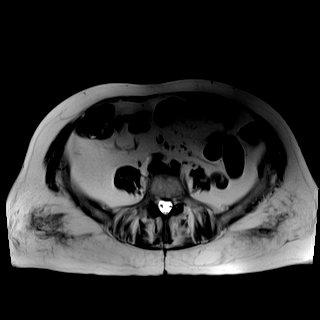

[Series 10: T1 dynamic · axial · non-contrast · 3.0mm · 1.25mm/px · z∈[-141,+72]mm · 3 of 72 slices shown]
[im 1/72]
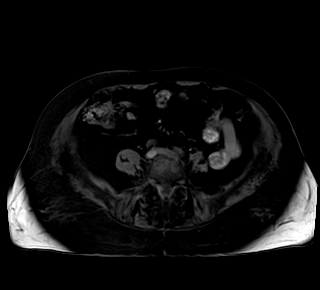
[im 36/72]
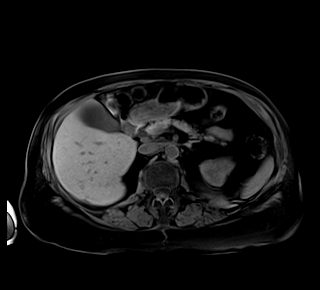
[im 72/72]
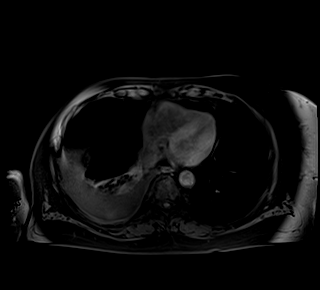

[Series 11: T1 dynamic post-contrast · axial · 3.0mm · 1.25mm/px · z∈[-141,+72]mm · 3 of 72 slices shown (1 of 9)]
[im 1/72]
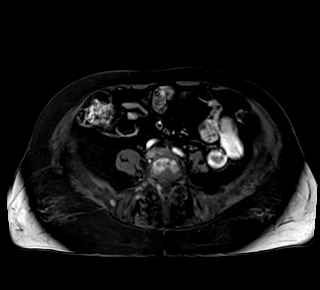
[im 36/72]
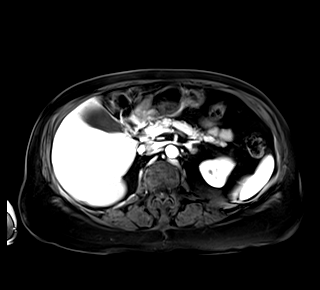
[im 72/72]
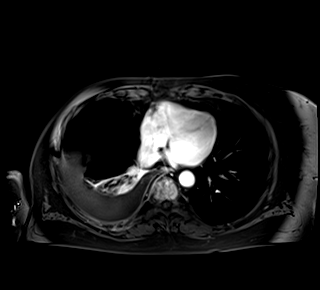

[Series 12: T1 dynamic post-contrast · axial · 3.0mm · 1.25mm/px · z∈[-141,+72]mm · 3 of 72 slices shown (2 of 9)]
[im 1/72]
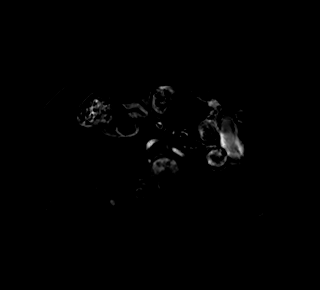
[im 36/72]
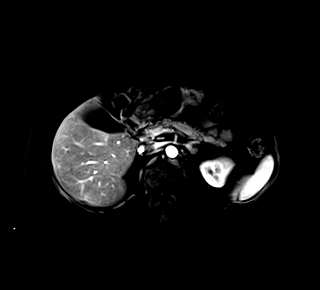
[im 72/72]
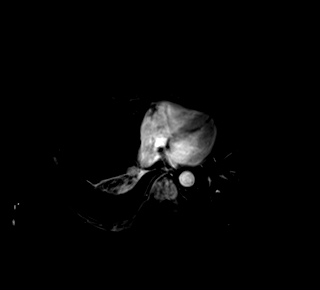

[Series 13: T1 dynamic post-contrast · axial · 3.0mm · 1.25mm/px · z∈[-141,+72]mm · 3 of 72 slices shown (3 of 9)]
[im 1/72]
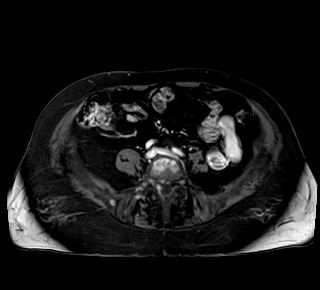
[im 36/72]
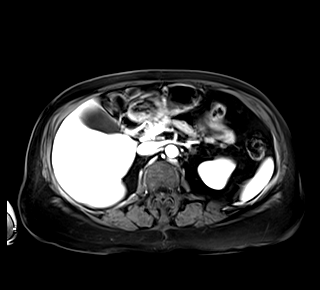
[im 72/72]
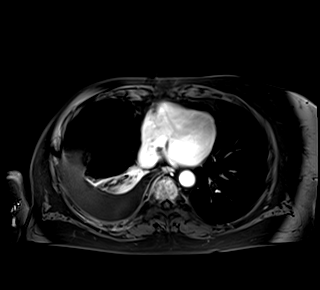

[Series 14: T1 dynamic post-contrast · axial · 3.0mm · 1.25mm/px · z∈[-141,+72]mm · 3 of 72 slices shown (4 of 9)]
[im 1/72]
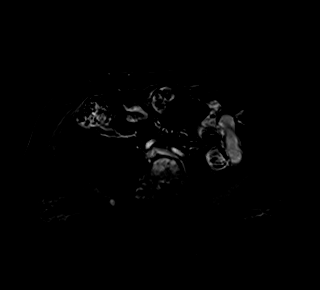
[im 36/72]
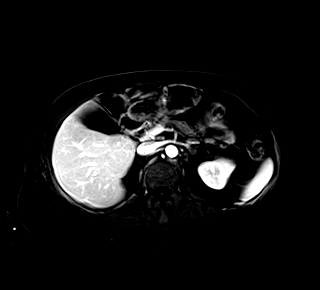
[im 72/72]
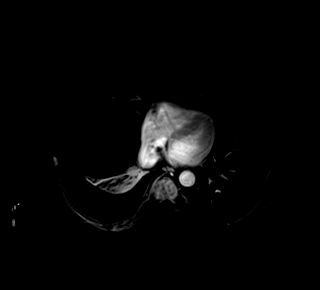

[Series 15: T1 dynamic post-contrast · axial · 3.0mm · 1.25mm/px · z∈[-141,+72]mm · 3 of 72 slices shown (5 of 9)]
[im 1/72]
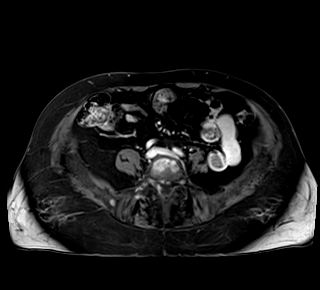
[im 36/72]
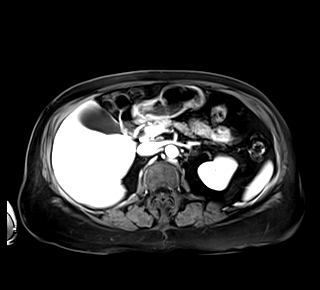
[im 72/72]
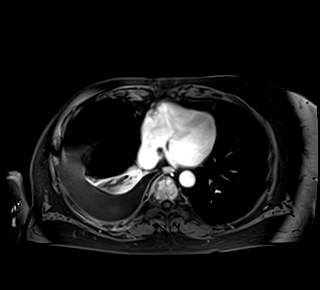

[Series 16: T1 dynamic post-contrast · axial · 3.0mm · 1.25mm/px · z∈[-141,+72]mm · 3 of 72 slices shown (6 of 9)]
[im 1/72]
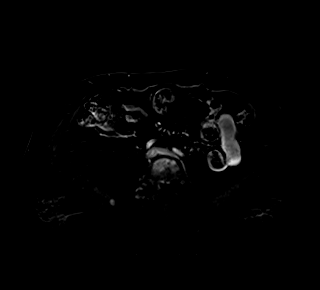
[im 36/72]
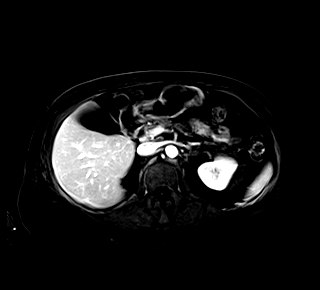
[im 72/72]
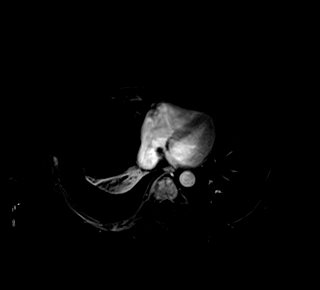

[Series 17: T1 dynamic post-contrast · axial · 3.0mm · 1.25mm/px · z∈[-141,+72]mm · 3 of 72 slices shown (7 of 9)]
[im 1/72]
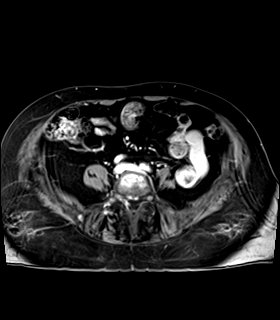
[im 36/72]
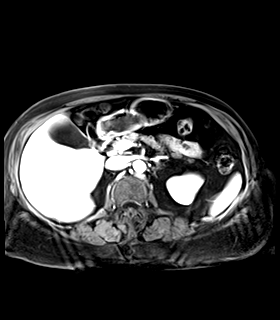
[im 72/72]
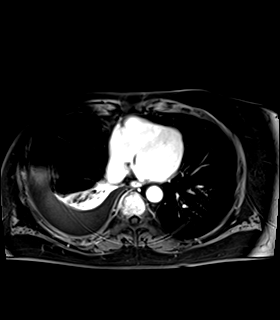

[Series 18: T1 dynamic post-contrast · axial · 3.0mm · 1.25mm/px · z∈[-141,+72]mm · 3 of 72 slices shown (8 of 9)]
[im 1/72]
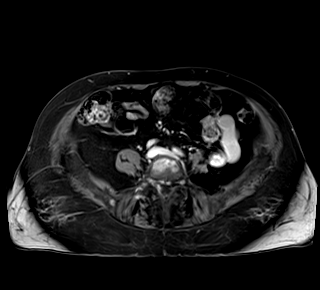
[im 36/72]
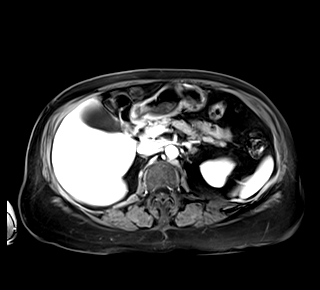
[im 72/72]
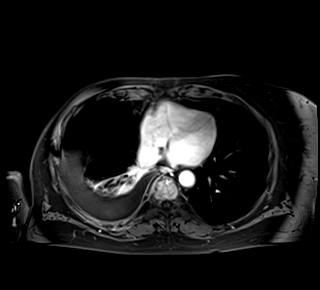

[Series 19: T1 dynamic post-contrast · axial · 3.0mm · 1.25mm/px · z∈[-141,-36]mm · 2 of 72 slices shown (9 of 9)]
[im 1/72]
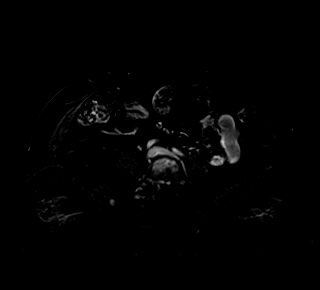
[im 36/72]
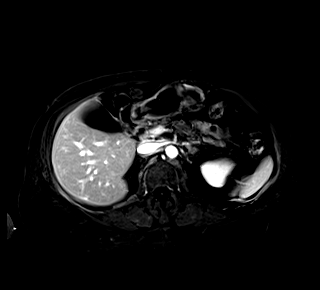

[47 of 48 positions shown; findings below may reference images not displayed]

FINDINGS: Lower chest: Postoperative changes of modified radical right
mastectomy. Moderate right pleural effusion. Signal intensity in the
right lower lobe dependently, poorly evaluated by today's magnetic
resonance examination, but likely passive atelectasis.

Hepatobiliary: Mild diffuse loss of signal intensity throughout the
hepatic parenchyma on out of phase dual echo images, indicative of a
background of mild hepatic steatosis. When compared to the prior
study, there has been a general increase in number and size of
numerous hepatic lesions, indicative of progressive metastatic
disease to the liver. The largest of these lesions is centered
predominantly in segment 3 (axial image 13 of series 6 and coronal
image 27 of series 3) measuring 2.5 x 1.7 x 2.2 cm on today's
examination (previously 2.1 x 1.5 x 2.0 cm). Several other smaller
lesions have also clearly enlarged, including a 1.5 x 1.0 cm lesion
in segment 5 (axial image 19 of series 6) which previously measured
1.2 x 0.9 cm, and a 1.0 x 0.9 cm lesion in segment 6 (axial image 18
of series 6) which previously measured only 7 x 6 mm. Some lesions
are new (example in segment 7 on axial image 11 of series 6
measuring 6 mm. These lesions restrict diffusion and are slightly
hypointense on T1 weighted images and hyperintense on T2 weighted
images, generally hypovascular in appearance on post gadolinium
images. Mild focal thickening of the gallbladder wall in the fundus
of the gallbladder where there appears to be some cystic changes,
suggesting adenomyomatosis. Gallbladder is not distended. No
pericholecystic fluid or surrounding inflammatory changes. No
intrahepatic biliary ductal dilatation. Common bile duct is mildly
dilated measuring 8 mm in the porta hepatis., and there is abrupt
cut off of the duct distally (best appreciated on coronal image 21
of series 3), where there appears to be extrinsic mass effect from
an adjacent structure, which appears to represent a prominent
periampullary duodenal diverticulum extending off the medial aspect
of the second portion of the duodenum. No filling defects are noted
in the common bile duct to suggest choledocholithiasis.

Pancreas: Structure associated with the pancreatic head, favored to
represent a periampullary duodenal diverticulum (discussed above).
No definite solid pancreatic mass. No pancreatic ductal dilatation.
No pancreatic or peripancreatic fluid collections or inflammatory
changes.

Spleen:  Unremarkable.

Adrenals/Urinary Tract: Bilateral kidneys and adrenal glands are
normal in appearance. No hydroureteronephrosis in the visualized
portions of the abdomen.

Stomach/Bowel: Stomach is unremarkable in appearance. Large
periampullary duodenal diverticulum in the second portion of the
duodenum. An additional diverticulum extends cephalad from the third
portion of the duodenum posterior to the pancreatic head and
uncinate process measuring 3.0 x 1.2 cm. Remaining visualized
portions are otherwise unremarkable.

Vascular/Lymphatic: Aortic atherosclerosis, without evidence of
aneurysm or dissection in the abdominal vasculature. No
lymphadenopathy noted in the abdomen.

Other: No significant volume of ascites noted in the visualized
portions of the peritoneal cavity.

Musculoskeletal: Several enhancing lesions are again noted in the
visualized axial skeleton, very similar to prior studies, compatible
with widespread metastatic disease to the bones.
IMPRESSION: 1. Progression of metastatic disease in the liver, with increased
number and size of hepatic metastasis, as detailed above.
2. Widespread metastatic disease to the bones again noted.
3. Moderate right pleural effusion lying dependently with passive
atelectasis in the right lower lobe.
4. Duodenal diverticulosis.
5. Probable adenomyomatosis in the fundus of the gallbladder.

## 2023-07-31 ENCOUNTER — Other Ambulatory Visit (HOSPITAL_COMMUNITY): Payer: Self-pay

## 2023-08-13 ENCOUNTER — Telehealth: Payer: Self-pay | Admitting: Pharmacy Technician

## 2023-08-13 ENCOUNTER — Other Ambulatory Visit (HOSPITAL_COMMUNITY): Payer: Self-pay

## 2023-08-13 ENCOUNTER — Other Ambulatory Visit: Payer: Self-pay | Admitting: Pharmacist

## 2023-08-13 MED ORDER — ELACESTRANT HYDROCHLORIDE 345 MG PO TABS: 345.0000 mg | ORAL_TABLET | Freq: Every day | ORAL | 3 refills | Status: DC

## 2023-08-13 NOTE — Telephone Encounter (Signed)
 Oral Oncology Patient Advocate Encounter  Received vm from the patient regarding questions on renewing assistance for Orserdu  with [employer].  I called and spoke with the PAP program and was advised that her application was received prior to 06/13/23 so it did not meet the cut-off for enrollment through the end of 2025, however, the representative stated the application was received only one day prior to that date so they may be able to use it for processing. All that was requested was a new rx be set in to the program.  I followed up with the patient as well to confirm the information they were referencing in the voicemail. Per the patient, the program had requested updated financial information. Patient requested to send the information to me via email so it can be submitted to [facility] if truly needed. I have sent an email to the patient's preferred email address:  [email or web address]   Patient stated they have enough medication on hand until mid- February  I will check back with the [facility] program next week to confirm that the patient's renewal has been processed appropriately and that no additional information is needed for continued support.  Paulette Borrow, CPhT-Adv Oncology Pharmacy Patient Advocate [facility] Direct Number: [phone]  Fax: [phone]

## 2023-08-13 NOTE — Telephone Encounter (Signed)
 Oral Oncology Patient Advocate Encounter  Received fax from [facility] confirming patient's re-enrollment in their assistance program for Orserdu . I will still check in directly with the program next week to confirm this letter is accurate.  Paulette Borrow, CPhT-Adv Oncology Pharmacy Patient Advocate [facility] Direct Number: [phone]  Fax: [phone]

## 2023-08-20 NOTE — Telephone Encounter (Signed)
Oral Oncology Patient Advocate Encounter  Left message for patient to confirm they have been re-enrolled in the program.  Jinger Neighbors, CPhT-Adv Oncology Pharmacy Patient Advocate [facility] Direct Number: [phone]  Fax: [phone]

## 2023-08-21 ENCOUNTER — Telehealth: Payer: Self-pay | Admitting: *Deleted

## 2023-08-21 ENCOUNTER — Other Ambulatory Visit: Payer: Self-pay | Admitting: *Deleted

## 2023-08-21 MED ORDER — AMOXICILLIN 500 MG PO TABS: 500.0000 mg | ORAL_TABLET | Freq: Two times a day (BID) | ORAL | 0 refills | Status: AC

## 2023-08-21 NOTE — Telephone Encounter (Signed)
Pt called with c/o an abscess forming at tooth. Pt not able to get in to see dentist due to dentist availability. OK per Dr.Gudena to send in rx for amoxicillin 500 BID for a week. Called pt to make aware of rx. Pt verbalized understanding.

## 2023-09-15 ENCOUNTER — Inpatient Hospital Stay: Payer: Medicare Other | Attending: Hematology and Oncology

## 2023-09-15 DIAGNOSIS — Z95828 Presence of other vascular implants and grafts: Secondary | ICD-10-CM

## 2023-09-15 DIAGNOSIS — C7951 Secondary malignant neoplasm of bone: Secondary | ICD-10-CM

## 2023-09-15 DIAGNOSIS — C50911 Malignant neoplasm of unspecified site of right female breast: Secondary | ICD-10-CM

## 2023-09-15 DIAGNOSIS — C50411 Malignant neoplasm of upper-outer quadrant of right female breast: Secondary | ICD-10-CM | POA: Insufficient documentation

## 2023-09-15 DIAGNOSIS — R978 Other abnormal tumor markers: Secondary | ICD-10-CM

## 2023-09-15 LAB — CBC WITH DIFFERENTIAL (CANCER CENTER ONLY)
Abs Immature Granulocytes: 0.05 10*3/uL (ref 0.00–0.07)
Basophils Absolute: 0.1 10*3/uL (ref 0.0–0.1)
Basophils Relative: 1 %
Eosinophils Absolute: 0.2 10*3/uL (ref 0.0–0.5)
Eosinophils Relative: 2 %
HCT: 39.6 % (ref 36.0–46.0)
Hemoglobin: 12.9 g/dL (ref 12.0–15.0)
Immature Granulocytes: 1 %
Lymphocytes Relative: 8 %
Lymphs Abs: 0.8 10*3/uL (ref 0.7–4.0)
MCH: 29 pg (ref 26.0–34.0)
MCHC: 32.6 g/dL (ref 30.0–36.0)
MCV: 89 fL (ref 80.0–100.0)
Monocytes Absolute: 1.1 10*3/uL — ABNORMAL HIGH (ref 0.1–1.0)
Monocytes Relative: 11 %
Neutro Abs: 7.6 10*3/uL (ref 1.7–7.7)
Neutrophils Relative %: 77 %
Platelet Count: 265 10*3/uL (ref 150–400)
RBC: 4.45 MIL/uL (ref 3.87–5.11)
RDW: 13.1 % (ref 11.5–15.5)
WBC Count: 9.9 10*3/uL (ref 4.0–10.5)
nRBC: 0 % (ref 0.0–0.2)

## 2023-09-15 LAB — CMP (CANCER CENTER ONLY)
ALT: 5 U/L (ref 0–44)
AST: 12 U/L — ABNORMAL LOW (ref 15–41)
Albumin: 3.9 g/dL (ref 3.5–5.0)
Alkaline Phosphatase: 148 U/L — ABNORMAL HIGH (ref 38–126)
Anion gap: 8 (ref 5–15)
BUN: 22 mg/dL (ref 8–23)
CO2: 22 mmol/L (ref 22–32)
Calcium: 9.7 mg/dL (ref 8.9–10.3)
Chloride: 108 mmol/L (ref 98–111)
Creatinine: 0.98 mg/dL (ref 0.44–1.00)
GFR, Estimated: >60 mL/min (ref >60)
Glucose, Bld: 151 mg/dL — ABNORMAL HIGH (ref 70–99)
Potassium: 4 mmol/L (ref 3.5–5.1)
Sodium: 138 mmol/L (ref 135–145)
Total Bilirubin: 0.3 mg/dL (ref 0.0–1.2)
Total Protein: 6.8 g/dL (ref 6.5–8.1)

## 2023-09-15 MED ORDER — SODIUM CHLORIDE 0.9% FLUSH
10.0000 mL | Freq: Once | INTRAVENOUS | Status: AC
  Administered 2023-09-15: 10 mL

## 2023-09-15 MED ORDER — HEPARIN SOD (PORK) LOCK FLUSH 100 UNIT/ML IV SOLN
500.0000 [IU] | Freq: Once | INTRAVENOUS | Status: AC
  Administered 2023-09-15: 500 [IU]

## 2023-09-16 LAB — CANCER ANTIGEN 27.29: CA 27.29: 149 U/mL — ABNORMAL HIGH (ref 0.0–38.6)

## 2023-10-13 ENCOUNTER — Telehealth (HOSPITAL_COMMUNITY): Payer: Self-pay | Admitting: Cardiology

## 2023-10-13 NOTE — Telephone Encounter (Signed)
 Called patient at [phone] to schedule her a 3 month follow up appt with an echocardiogram for Dr. Elwyn Lade.   Front office was unable to speak to patient directly so office staff left patient a detailed message about making an appointment and to call office back to schedule.

## 2023-10-16 ENCOUNTER — Telehealth (HOSPITAL_COMMUNITY): Payer: Self-pay | Admitting: Cardiology

## 2023-10-16 NOTE — Telephone Encounter (Signed)
 Front office called patient at [phone] to schedule an overdue f/u appt and an echocardiogram for Dr. Elwyn Lade.   Front office left patient a message to call back and get on the schedule for her f/u appt with an echocardiogram for Dr. Elwyn Lade.

## 2023-10-23 ENCOUNTER — Telehealth (HOSPITAL_COMMUNITY): Payer: Self-pay | Admitting: Cardiology

## 2023-10-23 NOTE — Telephone Encounter (Signed)
 Front office left a message to schedule patient an overdue appointment for an echocardiogram and also to see Dr. Elwyn Lade.  Patient was given instructions to call front office back to schedule appointment.

## 2023-11-10 ENCOUNTER — Inpatient Hospital Stay: Payer: Medicare Other | Attending: Hematology and Oncology

## 2023-11-10 VITALS — BP 109/71 | HR 102 | Temp 97.8°F | Resp 18

## 2023-11-10 DIAGNOSIS — C7951 Secondary malignant neoplasm of bone: Secondary | ICD-10-CM

## 2023-11-10 DIAGNOSIS — C50411 Malignant neoplasm of upper-outer quadrant of right female breast: Secondary | ICD-10-CM | POA: Insufficient documentation

## 2023-11-10 DIAGNOSIS — Z95828 Presence of other vascular implants and grafts: Secondary | ICD-10-CM

## 2023-11-10 DIAGNOSIS — C50911 Malignant neoplasm of unspecified site of right female breast: Secondary | ICD-10-CM

## 2023-11-10 MED ORDER — HEPARIN SOD (PORK) LOCK FLUSH 100 UNIT/ML IV SOLN
500.0000 [IU] | Freq: Once | INTRAVENOUS | Status: AC
Start: 2023-11-10 — End: 2023-11-10
  Administered 2023-11-10: 500 [IU]

## 2023-11-10 MED ORDER — SODIUM CHLORIDE 0.9% FLUSH
10.0000 mL | Freq: Once | INTRAVENOUS | Status: AC
  Administered 2023-11-10: 10 mL

## 2024-01-05 ENCOUNTER — Inpatient Hospital Stay: Payer: Medicare Other | Attending: Hematology and Oncology

## 2024-01-05 VITALS — BP 122/73 | HR 93 | Temp 98.2°F | Resp 17

## 2024-01-05 DIAGNOSIS — C50911 Malignant neoplasm of unspecified site of right female breast: Secondary | ICD-10-CM

## 2024-01-05 DIAGNOSIS — C7951 Secondary malignant neoplasm of bone: Secondary | ICD-10-CM

## 2024-01-05 DIAGNOSIS — C50411 Malignant neoplasm of upper-outer quadrant of right female breast: Secondary | ICD-10-CM | POA: Diagnosis not present

## 2024-01-05 DIAGNOSIS — Z95828 Presence of other vascular implants and grafts: Secondary | ICD-10-CM

## 2024-01-05 MED ORDER — SODIUM CHLORIDE 0.9% FLUSH
10.0000 mL | Freq: Once | INTRAVENOUS | Status: AC
  Administered 2024-01-05: 10 mL

## 2024-01-05 MED ORDER — HEPARIN SOD (PORK) LOCK FLUSH 100 UNIT/ML IV SOLN
500.0000 [IU] | Freq: Once | INTRAVENOUS | Status: AC
  Administered 2024-01-05: 500 [IU]

## 2024-01-08 ENCOUNTER — Other Ambulatory Visit: Payer: Self-pay | Admitting: Pharmacist

## 2024-01-08 ENCOUNTER — Telehealth: Payer: Self-pay | Admitting: Hematology and Oncology

## 2024-01-08 DIAGNOSIS — C7951 Secondary malignant neoplasm of bone: Secondary | ICD-10-CM

## 2024-01-08 MED ORDER — ELACESTRANT HYDROCHLORIDE 345 MG PO TABS: 345.0000 mg | ORAL_TABLET | Freq: Every day | ORAL | 3 refills | Status: DC

## 2024-01-08 NOTE — Progress Notes (Signed)
 Received refill request from nursing on elacestrant  after patient called. Discussed with Dr. Gudena and he would like to see her to review scans and have labs in approximately three weeks. These orders have been placed. Okay to refill elacestrant  to [facility].

## 2024-01-08 NOTE — Telephone Encounter (Signed)
 left vm for pt about scheduled appt date and times

## 2024-01-23 ENCOUNTER — Ambulatory Visit (HOSPITAL_COMMUNITY)
Admission: RE | Admit: 2024-01-23 | Discharge: 2024-01-23 | Disposition: A | Discharge status: Home / Self Care | Source: Ambulatory Visit | Attending: Hematology and Oncology

## 2024-01-23 DIAGNOSIS — C7951 Secondary malignant neoplasm of bone: Secondary | ICD-10-CM | POA: Insufficient documentation

## 2024-01-23 DIAGNOSIS — C801 Malignant (primary) neoplasm, unspecified: Secondary | ICD-10-CM | POA: Diagnosis not present

## 2024-01-23 LAB — POCT I-STAT CREATININE: Creatinine, Ser: 1.1 mg/dL — ABNORMAL HIGH (ref 0.44–1.00)

## 2024-01-23 MED ORDER — IOHEXOL 300 MG/ML  SOLN
100.0000 mL | Freq: Once | INTRAMUSCULAR | Status: AC | PRN
  Administered 2024-01-23: 100 mL via INTRAVENOUS

## 2024-01-28 NOTE — Assessment & Plan Note (Signed)
 Metastatic breast cancer (pericardial effusion, pleural effusion, right middle lobe lung lesion, ER 91%, PR 90%, Ki-67 35%, HER2 negative diagnosed February 2023)   (Prior history of right mastectomy 2005, adjuvant chemo with Adriamycin Cytoxan and Taxol followed by tamoxifen)   Current treatment:  1. Ribociclib, fulvestrant, Xgeva, ribociclib discontinued 10/18/2021  2. abemaciclib started 10/27/2021 Foundation 1 in 2021: MSI stable, TMB 1, ESR 1 mutation, PTEN, FGF 10, RICTOR amplifications   Guardant360 01/14/2022: No actionable mutations.  ESR 1 mutation. Liver biopsy 02/26/2022: Metastatic carcinoma consistent with breast primary. Caris mutation testing 02/26/2022: HER2 low, ESR 1 mutation, PTEN Enhertu discontinued for progression after 16 cycles on 06/06/2023  Treatment plan: Elacestrant started 06/18/2023 She has a puppy named Ricky Kizzie Ide Tzu 7 months old).    08/30/2022: CT chest: Stable moderate right pleural effusion with associated near complete collapse right lower lobe.  Unchanged bone metastases. 11/26/2022: CT CAP: No significant interval change.  Stable right-sided pleural effusion 5 mm lung nodule stable bone mets CA 27-29: 143 (was 695 on 05/23/2022)  03/06/2023: CT chest: Stable, small pleural effusion, stable tiny lung nodules, stable bone metastasis 03/06/2023: Liver MRI: Multiple hepatic metastases overall decreased in size 05/29/2023: MRI liver: Multiple liver lesions slightly increased in size and conspicuity.  Stable vertebral body metastasis benign left adrenal adenoma 06/05/2023: Unchanged moderate right pleural effusion  Elacestrant toxicities:  CA 27-29 showed an improvement after 1 month on Elacestrant.  (240 to 221) Port Blood clot: Currently on anticoagulation with Eliquis   Decreased Cardiac Function Ejection fraction dropped from 50-55% to 45-50% on recent echocardiogram. No symptoms of heart failure.  Seeing cardiology.  Return to clinic with labs in 1 month

## 2024-01-29 ENCOUNTER — Inpatient Hospital Stay: Attending: Hematology and Oncology

## 2024-01-29 ENCOUNTER — Inpatient Hospital Stay: Admitting: Hematology and Oncology

## 2024-01-29 VITALS — BP 113/69 | HR 92 | Temp 97.8°F | Resp 16 | Ht 64.5 in | Wt 158.2 lb

## 2024-01-29 DIAGNOSIS — C50911 Malignant neoplasm of unspecified site of right female breast: Secondary | ICD-10-CM

## 2024-01-29 DIAGNOSIS — C7800 Secondary malignant neoplasm of unspecified lung: Secondary | ICD-10-CM | POA: Diagnosis not present

## 2024-01-29 DIAGNOSIS — Z9011 Acquired absence of right breast and nipple: Secondary | ICD-10-CM | POA: Insufficient documentation

## 2024-01-29 DIAGNOSIS — C7951 Secondary malignant neoplasm of bone: Secondary | ICD-10-CM | POA: Insufficient documentation

## 2024-01-29 DIAGNOSIS — C50411 Malignant neoplasm of upper-outer quadrant of right female breast: Secondary | ICD-10-CM | POA: Insufficient documentation

## 2024-01-29 DIAGNOSIS — J90 Pleural effusion, not elsewhere classified: Secondary | ICD-10-CM | POA: Diagnosis not present

## 2024-01-29 DIAGNOSIS — M791 Myalgia, unspecified site: Secondary | ICD-10-CM | POA: Insufficient documentation

## 2024-01-29 DIAGNOSIS — R635 Abnormal weight gain: Secondary | ICD-10-CM | POA: Insufficient documentation

## 2024-01-29 DIAGNOSIS — Z170 Estrogen receptor positive status [ER+]: Secondary | ICD-10-CM | POA: Insufficient documentation

## 2024-01-29 DIAGNOSIS — C787 Secondary malignant neoplasm of liver and intrahepatic bile duct: Secondary | ICD-10-CM | POA: Diagnosis not present

## 2024-01-29 DIAGNOSIS — I3139 Other pericardial effusion (noninflammatory): Secondary | ICD-10-CM | POA: Insufficient documentation

## 2024-01-29 LAB — CMP (CANCER CENTER ONLY)
ALT: 8 U/L (ref 0–44)
AST: 15 U/L (ref 15–41)
Albumin: 4.4 g/dL (ref 3.5–5.0)
Alkaline Phosphatase: 152 U/L — ABNORMAL HIGH (ref 38–126)
Anion gap: 9 (ref 5–15)
BUN: 25 mg/dL — ABNORMAL HIGH (ref 8–23)
CO2: 26 mmol/L (ref 22–32)
Calcium: 10.4 mg/dL — ABNORMAL HIGH (ref 8.9–10.3)
Chloride: 105 mmol/L (ref 98–111)
Creatinine: 1.08 mg/dL — ABNORMAL HIGH (ref 0.44–1.00)
GFR, Estimated: 54 mL/min — ABNORMAL LOW (ref >60)
Glucose, Bld: 100 mg/dL — ABNORMAL HIGH (ref 70–99)
Potassium: 4.5 mmol/L (ref 3.5–5.1)
Sodium: 140 mmol/L (ref 135–145)
Total Bilirubin: 0.3 mg/dL (ref 0.0–1.2)
Total Protein: 8 g/dL (ref 6.5–8.1)

## 2024-01-29 LAB — CBC WITH DIFFERENTIAL (CANCER CENTER ONLY)
Abs Immature Granulocytes: 0.12 10*3/uL — ABNORMAL HIGH (ref 0.00–0.07)
Basophils Absolute: 0.1 10*3/uL (ref 0.0–0.1)
Basophils Relative: 1 %
Eosinophils Absolute: 0.2 10*3/uL (ref 0.0–0.5)
Eosinophils Relative: 2 %
HCT: 44.3 % (ref 36.0–46.0)
Hemoglobin: 14.3 g/dL (ref 12.0–15.0)
Immature Granulocytes: 1 %
Lymphocytes Relative: 9 %
Lymphs Abs: 1 10*3/uL (ref 0.7–4.0)
MCH: 28 pg (ref 26.0–34.0)
MCHC: 32.3 g/dL (ref 30.0–36.0)
MCV: 86.9 fL (ref 80.0–100.0)
Monocytes Absolute: 1.2 10*3/uL — ABNORMAL HIGH (ref 0.1–1.0)
Monocytes Relative: 11 %
Neutro Abs: 8.3 10*3/uL — ABNORMAL HIGH (ref 1.7–7.7)
Neutrophils Relative %: 76 %
Platelet Count: 277 10*3/uL (ref 150–400)
RBC: 5.1 MIL/uL (ref 3.87–5.11)
RDW: 14.2 % (ref 11.5–15.5)
WBC Count: 11 10*3/uL — ABNORMAL HIGH (ref 4.0–10.5)
nRBC: 0 % (ref 0.0–0.2)

## 2024-01-29 NOTE — Progress Notes (Signed)
 Patient Care Team: Dyane Anthony RAMAN, FNP as PCP - General (Family Medicine) Alvan Ronal BRAVO, MD (Inactive) as PCP - Cardiology (Cardiology) Jeffrie Oneil BROCKS, MD (Cardiology) Lucas Dorise POUR, MD (Cardiothoracic Surgery) Lucila Norleen LABOR, RPH-CPP (Pharmacist) Dannielle Alm BRAVO, MD as Referring Physician (Radiation Oncology) Odean Potts, MD as Consulting Physician (Hematology and Oncology)  DIAGNOSIS:  Encounter Diagnosis  Name Primary?   Carcinoma of right breast metastatic to lung (HCC) Yes    SUMMARY OF ONCOLOGIC HISTORY: Oncology History  Carcinoma of right breast metastatic to lung (HCC)  10/10/2011 Initial Diagnosis   Carcinoma of right breast metastatic to lung (HCC)   05/02/2022 - 06/06/2023 Chemotherapy   Patient is on Treatment Plan : BREAST METASTATIC Fam-Trastuzumab Deruxtecan-nxki  (Enhertu ) (5.4) q21d     Malignant neoplasm of upper-outer quadrant of right breast in female, estrogen receptor positive (HCC)  03/22/2004 Initial Diagnosis   right mastectomy and sentinel lymph node sampling 03/22/2004 for a right upper-outer quadrant pT2 pN1, stage IIB invasive ductal carcinoma with lobular features, grade 2, estrogen receptor and progesterone receptor positive, HER-2 negative, with an MIB-1 of 9%   05/2004 - 09/18/2004 Chemotherapy   adjuvant chemotherapy with dose dense doxorubicin and cyclophosphamide x4 cycles (first cycle delayed one week) followed by dose dense paclitaxel x4 was completed 09/18/2004   09/2004 - 2009 Anti-estrogen oral therapy   tamoxifen started March 2006, discontinued 2009   08/2011 Relapse/Recurrence   Metastatic disease:large pericardial effusion, large right pleural effusion and possible right middle lobe bronchial obstruction  status post pericardial window placement, fiberoptic bronchoscopy and right Pleurx placement 09/11/2011, with biopsy of the bronchus intermedius and pericardium positive for metastatic breast cancer, ER 91%, PR 100% p with an MIB-1  of 35%, and no HER-2 amplification, ratio being 1.37   10/10/2011 - 07/2019 Anti-estrogen oral therapy   BOLERO-4 trial 10/10/2011, receiving letrozole  and everolimus  (stopped for progression)   08/16/2019 Miscellaneous   foundation 1 requested on liver biopsy from 08/16/2019 shows a stable microsatellite status with 1 mutation/MB, amplification of C11orf30, FGF10 and RICTOR, with mutations in ESR1 and PTEN   08/16/2019 Miscellaneous   Liver biopsy: Metastatic carcinoma to the liver consistent with breast primary, ER 100%, PR 100%, Ki-67 5%, HER2 2+ by IHC, FISH HER2 negative   08/24/2019 -  Anti-estrogen oral therapy   fulvestrant  started 08/24/2019, repeated every 4 weeks Palbociclib  125 mg daily 21 days on 7 days off started 08/25/2019 Switched to ribociclib  s starting 08/22/2020   02/28/2021 -  Radiation Therapy   palliative radiation (Dr. Dannielle in [place]) 02/28/2021 to right hip area   10/27/2021 -  Anti-estrogen oral therapy   Abemaciclib  with fulvestrant    01/14/2022 Miscellaneous   Guardant360: ESR 1 mutation (no other mutations) MSI high was not detected, PTEN, T p53, EGFR amplification, RB1 mutations detected without any approved treatment options   Malignant neoplasm metastatic to liver (HCC)  08/09/2019 Initial Diagnosis   Malignant neoplasm metastatic to liver (HCC)     CHIEF COMPLIANT: Follow-up of metastatic breast cancer on Elacestrant   HISTORY OF PRESENT ILLNESS:  History of Present Illness Barbara Parks is a 73 year old female with a history of cancer currently on Elacestrant  who presents with weight gain and joint pain.  She had recent scans and is here to discuss results  She has gained 20 pounds since March 2025, which she attributes to Elacestrant , started in late 2024. She reports an increased appetite.  She experiences significant joint and muscle pain, particularly in  her right arm and back, described as 'stiff and achy,' resembling arthritis. The pain  impairs her ability to lift objects. Tylenol  and ibuprofen provide partial relief.  Her cancer treatment history includes chemotherapy, with a tumor marker of 149 in February 2025. She expresses concern about potential worsening of bone cancer. Xgeva  injections were discontinued, with the last dose in 2023. Routine port flushes continue.  She uses essential oils for pain management and is considering a vibration plate for circulation and joint pain.     ALLERGIES:  is allergic to aspirin, latex, and nsaids.  MEDICATIONS:  Current Outpatient Medications  Medication Sig Dispense Refill   apixaban  (ELIQUIS ) 5 MG TABS tablet Take 1 tablet (5 mg total) by mouth 2 (two) times daily. Receives in the mail from [facility].     carvedilol  (COREG ) 3.125 MG tablet TAKE 1 TABLET BY MOUTH TWICE A DAY WITH A MEAL 180 tablet 3   cholecalciferol (VITAMIN D3) 25 MCG (1000 UT) tablet Take 2 tablets (2,000 Units total) by mouth daily. 180 tablet 4   elacestrant  hydrochloride (ORSERDU ) 345 MG tablet Take 1 tablet (345 mg total) by mouth daily. Take with food. 30 tablet 3   ezetimibe  (ZETIA ) 10 MG tablet Take 1 tablet (10 mg total) by mouth daily. 90 tablet 3   gemfibrozil  (LOPID ) 600 MG tablet TAKE 1 TABLET (600 MG TOTAL) BY MOUTH 2 (TWO) TIMES DAILY BEFORE A MEAL. 180 tablet 2   loperamide  (IMODIUM ) 2 MG capsule Take 2 mg by mouth 4 (four) times daily as needed for diarrhea or loose stools. Reported on 11/30/2015     losartan  (COZAAR ) 25 MG tablet Take 0.5 tablets (12.5 mg total) by mouth daily. (Patient not taking: Reported on 07/02/2023) 45 tablet 3   metFORMIN  (GLUCOPHAGE -XR) 500 MG 24 hr tablet Take 1 tablet (500 mg total) by mouth daily with supper. Followed by [facility] (Patient not taking: Reported on 06/24/2023)  3   omeprazole  (PRILOSEC OTC) 20 MG tablet Take 20 mg by mouth daily as needed (acid reflux).     prochlorperazine  (COMPAZINE ) 10 MG tablet Take 10 mg by mouth every 6 (six) hours  as needed for nausea or vomiting.     No current facility-administered medications for this visit.    PHYSICAL EXAMINATION: ECOG PERFORMANCE STATUS: 1 - Symptomatic but completely ambulatory  Vitals:   01/29/24 1012  BP: 113/69  Pulse: 92  Resp: 16  Temp: 97.8 F (36.6 C)  SpO2: 97%   Filed Weights   01/29/24 1012  Weight: 158 lb 3.2 oz (71.8 kg)    LABORATORY DATA:  I have reviewed the data as listed    Latest Ref Rng & Units 01/29/2024    9:53 AM 01/23/2024    1:34 PM 09/15/2023    2:23 PM  CMP  Glucose 70 - 99 mg/dL 899   848   BUN 8 - 23 mg/dL 25   22   Creatinine 9.55 - 1.00 mg/dL 8.91  8.89  9.01   Sodium 135 - 145 mmol/L 140   138   Potassium 3.5 - 5.1 mmol/L 4.5   4.0   Chloride 98 - 111 mmol/L 105   108   CO2 22 - 32 mmol/L 26   22   Calcium  8.9 - 10.3 mg/dL 89.5   9.7   Total Protein 6.5 - 8.1 g/dL 8.0   6.8   Total Bilirubin 0.0 - 1.2 mg/dL 0.3   0.3   Alkaline Phos 38 -  126 U/L 152   148   AST 15 - 41 U/L 15   12   ALT 0 - 44 U/L 8   5     Lab Results  Component Value Date   WBC 11.0 (H) 01/29/2024   HGB 14.3 01/29/2024   HCT 44.3 01/29/2024   MCV 86.9 01/29/2024   PLT 277 01/29/2024   NEUTROABS 8.3 (H) 01/29/2024    ASSESSMENT & PLAN:  Carcinoma of right breast metastatic to lung Metastatic breast cancer (pericardial effusion, pleural effusion, right middle lobe lung lesion, ER 91%, PR 90%, Ki-67 35%, HER2 negative diagnosed February 2023)   (Prior history of right mastectomy 2005, adjuvant chemo with Adriamycin Cytoxan and Taxol followed by tamoxifen)   Current treatment:  1. Ribociclib , fulvestrant , Xgeva , ribociclib  discontinued 10/18/2021  2. abemaciclib  started 10/27/2021 Foundation 1 in 2021: MSI stable, TMB 1, ESR 1 mutation, PTEN, FGF 10, RICTOR amplifications   Guardant360 01/14/2022: No actionable mutations.  ESR 1 mutation. Liver biopsy 02/26/2022: Metastatic carcinoma consistent with breast primary. Caris mutation testing 02/26/2022:  HER2 low, ESR 1 mutation, PTEN Enhertu  discontinued for progression after 16 cycles on 06/06/2023   Treatment plan: Elacestrant  started 06/18/2023 She has a puppy named Ricky Erle Tzu 7 months old).    08/30/2022: CT chest: Stable moderate right pleural effusion with associated near complete collapse right lower lobe.  Unchanged bone metastases. 11/26/2022: CT CAP: No significant interval change.  Stable right-sided pleural effusion 5 mm lung nodule stable bone mets CA 27-29: 143 (was 695 on 05/23/2022)  03/06/2023: CT chest: Stable, small pleural effusion, stable tiny lung nodules, stable bone metastasis 03/06/2023: Liver MRI: Multiple hepatic metastases overall decreased in size 05/29/2023: MRI liver: Multiple liver lesions slightly increased in size and conspicuity.  Stable vertebral body metastasis benign left adrenal adenoma 06/05/2023: Unchanged moderate right pleural effusion 01/23/2024: CT CAP: Moderate loculated right pleural effusion, stable liver lesions, stable bone metastasis   Elacestrant  toxicities: Joint stiffness and achiness Weight gain   CA 27-29 showed an improvement after 1 month on Elacestrant .  (240 to 221) Port Blood clot: Currently on anticoagulation with Eliquis    Decreased Cardiac Function Ejection fraction dropped from 50-55% to 45-50% on recent echocardiogram. No symptoms of heart failure.  Seeing cardiology.   Bone metastasis: I recommended restarting Xgeva .  Will add this to her next month appointment for a port flush  Assessment & Plan Metastatic carcinoma of right breast Metastatic carcinoma with lung and liver metastases. Tumor markers previously decreasing; recent markers pending. Weight gain possibly medication-related. Current treatment effective and preferred by her. - Order tumor markers as standing order for future visits. - Continue current treatment regimen. - Discuss weight management strategies.  Joint and muscle pain due to medication Severe joint  and muscle pain, especially in right arm and shoulder, likely medication-induced. Partially relieved by ibuprofen and acetaminophen . Exploring alternative therapies. Xgeva  injections to be reinstated. - Continue ibuprofen and acetaminophen  as needed. - Consider vibration plate for joint pain and circulation. - Apply muscle rubs and essential oils for pain relief. - Reinstate Xgeva  injections starting August 4th.      No orders of the defined types were placed in this encounter.  The patient has a good understanding of the overall plan. she agrees with it. she will call with any problems that may develop before the next visit here. Total time spent: 30 mins including face to face time and time spent for planning, charting and co-ordination of care   [place]  MARLA Chad, MD 01/29/24

## 2024-02-15 ENCOUNTER — Other Ambulatory Visit: Payer: Self-pay | Admitting: Hematology and Oncology

## 2024-02-16 ENCOUNTER — Encounter: Payer: Self-pay | Admitting: Hematology and Oncology

## 2024-03-01 ENCOUNTER — Inpatient Hospital Stay: Payer: Medicare Other | Attending: Hematology and Oncology

## 2024-03-01 DIAGNOSIS — C7951 Secondary malignant neoplasm of bone: Secondary | ICD-10-CM | POA: Insufficient documentation

## 2024-03-01 DIAGNOSIS — C50911 Malignant neoplasm of unspecified site of right female breast: Secondary | ICD-10-CM

## 2024-03-01 DIAGNOSIS — Z170 Estrogen receptor positive status [ER+]: Secondary | ICD-10-CM | POA: Insufficient documentation

## 2024-03-01 DIAGNOSIS — Z95828 Presence of other vascular implants and grafts: Secondary | ICD-10-CM

## 2024-03-01 DIAGNOSIS — C50411 Malignant neoplasm of upper-outer quadrant of right female breast: Secondary | ICD-10-CM | POA: Diagnosis not present

## 2024-03-01 LAB — CBC WITH DIFFERENTIAL (CANCER CENTER ONLY)
Abs Immature Granulocytes: 0.05 K/uL (ref 0.00–0.07)
Basophils Absolute: 0.1 K/uL (ref 0.0–0.1)
Basophils Relative: 1 %
Eosinophils Absolute: 0.2 K/uL (ref 0.0–0.5)
Eosinophils Relative: 2 %
HCT: 40.1 % (ref 36.0–46.0)
Hemoglobin: 12.9 g/dL (ref 12.0–15.0)
Immature Granulocytes: 0 %
Lymphocytes Relative: 8 %
Lymphs Abs: 0.9 K/uL (ref 0.7–4.0)
MCH: 27.6 pg (ref 26.0–34.0)
MCHC: 32.2 g/dL (ref 30.0–36.0)
MCV: 85.7 fL (ref 80.0–100.0)
Monocytes Absolute: 1.2 K/uL — ABNORMAL HIGH (ref 0.1–1.0)
Monocytes Relative: 11 %
Neutro Abs: 8.9 K/uL — ABNORMAL HIGH (ref 1.7–7.7)
Neutrophils Relative %: 78 %
Platelet Count: 271 K/uL (ref 150–400)
RBC: 4.68 MIL/uL (ref 3.87–5.11)
RDW: 14.4 % (ref 11.5–15.5)
WBC Count: 11.3 K/uL — ABNORMAL HIGH (ref 4.0–10.5)
nRBC: 0 % (ref 0.0–0.2)

## 2024-03-01 LAB — CMP (CANCER CENTER ONLY)
ALT: 6 U/L (ref 0–44)
AST: 13 U/L — ABNORMAL LOW (ref 15–41)
Albumin: 4.1 g/dL (ref 3.5–5.0)
Alkaline Phosphatase: 134 U/L — ABNORMAL HIGH (ref 38–126)
Anion gap: 7 (ref 5–15)
BUN: 21 mg/dL (ref 8–23)
CO2: 25 mmol/L (ref 22–32)
Calcium: 9.5 mg/dL (ref 8.9–10.3)
Chloride: 106 mmol/L (ref 98–111)
Creatinine: 1.12 mg/dL — ABNORMAL HIGH (ref 0.44–1.00)
GFR, Estimated: 52 mL/min — ABNORMAL LOW (ref >60)
Glucose, Bld: 118 mg/dL — ABNORMAL HIGH (ref 70–99)
Potassium: 4.2 mmol/L (ref 3.5–5.1)
Sodium: 138 mmol/L (ref 135–145)
Total Bilirubin: 0.3 mg/dL (ref 0.0–1.2)
Total Protein: 7.4 g/dL (ref 6.5–8.1)

## 2024-03-01 MED ORDER — SODIUM CHLORIDE 0.9% FLUSH
10.0000 mL | Freq: Once | INTRAVENOUS | Status: AC
Start: 2024-03-01 — End: 2024-03-01
  Administered 2024-03-01: 10 mL

## 2024-03-02 ENCOUNTER — Other Ambulatory Visit: Payer: Self-pay | Admitting: *Deleted

## 2024-03-02 DIAGNOSIS — C787 Secondary malignant neoplasm of liver and intrahepatic bile duct: Secondary | ICD-10-CM

## 2024-03-02 LAB — CANCER ANTIGEN 27.29: CA 27.29: 356.1 U/mL — ABNORMAL HIGH (ref 0.0–38.6)

## 2024-03-02 NOTE — Progress Notes (Signed)
 Per MD request RN placed orders for Liver MRI.  Appt scheduled, pt notified and verbalized understanding.

## 2024-03-03 ENCOUNTER — Ambulatory Visit (HOSPITAL_COMMUNITY)
Admission: RE | Admit: 2024-03-03 | Discharge: 2024-03-03 | Disposition: A | Discharge status: Home / Self Care | Source: Ambulatory Visit | Attending: Hematology and Oncology | Admitting: Hematology and Oncology

## 2024-03-03 DIAGNOSIS — C50919 Malignant neoplasm of unspecified site of unspecified female breast: Secondary | ICD-10-CM | POA: Diagnosis not present

## 2024-03-03 DIAGNOSIS — C7951 Secondary malignant neoplasm of bone: Secondary | ICD-10-CM | POA: Diagnosis not present

## 2024-03-03 DIAGNOSIS — C787 Secondary malignant neoplasm of liver and intrahepatic bile duct: Secondary | ICD-10-CM | POA: Diagnosis not present

## 2024-03-03 DIAGNOSIS — D3502 Benign neoplasm of left adrenal gland: Secondary | ICD-10-CM | POA: Diagnosis not present

## 2024-03-03 MED ORDER — GADOBUTROL 1 MMOL/ML IV SOLN
7.0000 mL | Freq: Once | INTRAVENOUS | Status: AC | PRN
  Administered 2024-03-03: 7 mL via INTRAVENOUS

## 2024-03-05 ENCOUNTER — Other Ambulatory Visit: Payer: Self-pay | Admitting: Physician Assistant

## 2024-03-05 ENCOUNTER — Telehealth: Payer: Self-pay | Admitting: *Deleted

## 2024-03-05 MED ORDER — CYCLOBENZAPRINE HCL 7.5 MG PO TABS: 7.5000 mg | ORAL_TABLET | Freq: Three times a day (TID) | ORAL | 0 refills | Status: AC | PRN

## 2024-03-05 NOTE — Progress Notes (Signed)
 See phone note from RN from today.

## 2024-03-05 NOTE — Telephone Encounter (Signed)
 Received call from pt with complaint of severe back spasms.  Pt states she recently got a new refrigerator and has been moving items around.  Pt states the spasms are so severe that she is unable to sit down or to drive to our office to be seen.  MD out of office, RN reviewed with [facility] who has sent in prescription for flexeril .  Pt educated on medication and to go to ED if symptoms become worse. Pt verbalized understanding.

## 2024-03-07 NOTE — Assessment & Plan Note (Deleted)
 Metastatic breast cancer (pericardial effusion, pleural effusion, right middle lobe lung lesion, ER 91%, PR 90%, Ki-67 35%, HER2 negative diagnosed February 2023)   (Prior history of right mastectomy 2005, adjuvant chemo with Adriamycin Cytoxan and Taxol followed by tamoxifen)   Current treatment:  1. Ribociclib , fulvestrant , Xgeva , ribociclib  discontinued 10/18/2021  2. abemaciclib  started 10/27/2021 Foundation 1 in 2021: MSI stable, TMB 1, ESR 1 mutation, PTEN, FGF 10, RICTOR amplifications   Guardant360 01/14/2022: No actionable mutations.  ESR 1 mutation. Liver biopsy 02/26/2022: Metastatic carcinoma consistent with breast primary. Caris mutation testing 02/26/2022: HER2 low, ESR 1 mutation, PTEN Enhertu  discontinued for progression after 16 cycles on 06/06/2023   Treatment plan: Elacestrant  started 06/18/2023 She has a puppy named Ricky Erle Tzu 7 months old).    08/30/2022: CT chest: Stable moderate right pleural effusion with associated near complete collapse right lower lobe.  Unchanged bone metastases. 11/26/2022: CT CAP: No significant interval change.  Stable right-sided pleural effusion 5 mm lung nodule stable bone mets CA 27-29: 143 (was 695 on 05/23/2022)  03/06/2023: CT chest: Stable, small pleural effusion, stable tiny lung nodules, stable bone metastasis 03/06/2023: Liver MRI: Multiple hepatic metastases overall decreased in size 05/29/2023: MRI liver: Multiple liver lesions slightly increased in size and conspicuity.  Stable vertebral body metastasis benign left adrenal adenoma 06/05/2023: Unchanged moderate right pleural effusion 01/23/2024: CT CAP: Moderate loculated right pleural effusion, stable liver lesions, stable bone metastasis   Elacestrant  toxicities: Joint stiffness and achiness Weight gain   CA 27-29 showed an improvement after 1 month on Elacestrant .  (240 to 221) Port Blood clot: Currently on anticoagulation with Eliquis    Decreased Cardiac Function Ejection fraction  dropped from 50-55% to 45-50% on recent echocardiogram. No symptoms of heart failure.  Seeing cardiology.   Bone metastasis: Restarted Xgeva   Severe Back Spasms:

## 2024-03-08 ENCOUNTER — Ambulatory Visit

## 2024-03-08 ENCOUNTER — Ambulatory Visit: Admitting: Hematology and Oncology

## 2024-03-08 ENCOUNTER — Telehealth: Payer: Self-pay

## 2024-03-08 DIAGNOSIS — C50911 Malignant neoplasm of unspecified site of right female breast: Secondary | ICD-10-CM

## 2024-03-08 NOTE — Telephone Encounter (Signed)
 Called to check on pt as she informed Claudeen Panning, CMA that she was unable to come to appt d/t back pain.  Pt states she was moving a refrigerator and transferring food as her refrigerator was broken and received a new one. She validates knowing she has spinal mets and a dx of herniated disc. She states she reached out to another MD and was able to get rx for cyclobenzaprine  and this has helped combined with rest. Advised pt to call us  if sx worsen or do not improve. She is agreeable.

## 2024-03-16 ENCOUNTER — Inpatient Hospital Stay (HOSPITAL_BASED_OUTPATIENT_CLINIC_OR_DEPARTMENT_OTHER): Admitting: Hematology and Oncology

## 2024-03-16 ENCOUNTER — Inpatient Hospital Stay

## 2024-03-16 VITALS — BP 124/79 | HR 93 | Temp 98.7°F | Resp 18

## 2024-03-16 VITALS — BP 127/74 | HR 101 | Temp 97.4°F | Resp 18 | Wt 156.3 lb

## 2024-03-16 DIAGNOSIS — C7800 Secondary malignant neoplasm of unspecified lung: Secondary | ICD-10-CM

## 2024-03-16 DIAGNOSIS — C7951 Secondary malignant neoplasm of bone: Secondary | ICD-10-CM

## 2024-03-16 DIAGNOSIS — C50411 Malignant neoplasm of upper-outer quadrant of right female breast: Secondary | ICD-10-CM | POA: Diagnosis not present

## 2024-03-16 DIAGNOSIS — Z95828 Presence of other vascular implants and grafts: Secondary | ICD-10-CM

## 2024-03-16 DIAGNOSIS — Z170 Estrogen receptor positive status [ER+]: Secondary | ICD-10-CM | POA: Diagnosis not present

## 2024-03-16 DIAGNOSIS — C50911 Malignant neoplasm of unspecified site of right female breast: Secondary | ICD-10-CM

## 2024-03-16 MED ORDER — CYCLOBENZAPRINE HCL 10 MG PO TABS: 10.0000 mg | ORAL_TABLET | Freq: Three times a day (TID) | ORAL | 0 refills | Status: DC | PRN

## 2024-03-16 MED ORDER — DENOSUMAB 120 MG/1.7ML ~~LOC~~ SOLN
120.0000 mg | Freq: Once | SUBCUTANEOUS | Status: AC
  Administered 2024-03-16: 120 mg via SUBCUTANEOUS
  Filled 2024-03-16: qty 1.7

## 2024-03-16 NOTE — Progress Notes (Signed)
 Patient Care Team: Dyane Anthony RAMAN, FNP as PCP - General (Family Medicine) Alvan Ronal BRAVO, MD (Inactive) as PCP - Cardiology (Cardiology) Jeffrie Oneil BROCKS, MD (Cardiology) Lucas Dorise POUR, MD (Cardiothoracic Surgery) Lucila Norleen LABOR, RPH-CPP (Pharmacist) Dannielle Alm BRAVO, MD as Referring Physician (Radiation Oncology) Odean Potts, MD as Consulting Physician (Hematology and Oncology)  DIAGNOSIS:  Encounter Diagnosis  Name Primary?   Carcinoma of right breast metastatic to lung (HCC) Yes    SUMMARY OF ONCOLOGIC HISTORY: Oncology History  Carcinoma of right breast metastatic to lung (HCC)  10/10/2011 Initial Diagnosis   Carcinoma of right breast metastatic to lung (HCC)   05/02/2022 - 06/06/2023 Chemotherapy   Patient is on Treatment Plan : BREAST METASTATIC Fam-Trastuzumab Deruxtecan-nxki  (Enhertu ) (5.4) q21d     Malignant neoplasm of upper-outer quadrant of right breast in female, estrogen receptor positive (HCC)  03/22/2004 Initial Diagnosis   right mastectomy and sentinel lymph node sampling 03/22/2004 for a right upper-outer quadrant pT2 pN1, stage IIB invasive ductal carcinoma with lobular features, grade 2, estrogen receptor and progesterone receptor positive, HER-2 negative, with an MIB-1 of 9%   05/2004 - 09/18/2004 Chemotherapy   adjuvant chemotherapy with dose dense doxorubicin and cyclophosphamide x4 cycles (first cycle delayed one week) followed by dose dense paclitaxel x4 was completed 09/18/2004   09/2004 - 2009 Anti-estrogen oral therapy   tamoxifen started March 2006, discontinued 2009   08/2011 Relapse/Recurrence   Metastatic disease:large pericardial effusion, large right pleural effusion and possible right middle lobe bronchial obstruction  status post pericardial window placement, fiberoptic bronchoscopy and right Pleurx placement 09/11/2011, with biopsy of the bronchus intermedius and pericardium positive for metastatic breast cancer, ER 91%, PR 100% p with an MIB-1  of 35%, and no HER-2 amplification, ratio being 1.37   10/10/2011 - 07/2019 Anti-estrogen oral therapy   BOLERO-4 trial 10/10/2011, receiving letrozole  and everolimus  (stopped for progression)   08/16/2019 Miscellaneous   foundation 1 requested on liver biopsy from 08/16/2019 shows a stable microsatellite status with 1 mutation/MB, amplification of C11orf30, FGF10 and RICTOR, with mutations in ESR1 and PTEN   08/16/2019 Miscellaneous   Liver biopsy: Metastatic carcinoma to the liver consistent with breast primary, ER 100%, PR 100%, Ki-67 5%, HER2 2+ by IHC, FISH HER2 negative   08/24/2019 -  Anti-estrogen oral therapy   fulvestrant  started 08/24/2019, repeated every 4 weeks Palbociclib  125 mg daily 21 days on 7 days off started 08/25/2019 Switched to ribociclib  s starting 08/22/2020   02/28/2021 -  Radiation Therapy   palliative radiation (Dr. Dannielle in [place]) 02/28/2021 to right hip area   10/27/2021 -  Anti-estrogen oral therapy   Abemaciclib  with fulvestrant    01/14/2022 Miscellaneous   Guardant360: ESR 1 mutation (no other mutations) MSI high was not detected, PTEN, T p53, EGFR amplification, RB1 mutations detected without any approved treatment options   Malignant neoplasm metastatic to liver (HCC)  08/09/2019 Initial Diagnosis   Malignant neoplasm metastatic to liver (HCC)     CHIEF COMPLIANT:   HISTORY OF PRESENT ILLNESS: Discussed the use of AI scribe software for clinical note transcription with the patient, who gave verbal consent to proceed.  History of Present Illness Barbara Parks is a 73 year old female with bone cancer who presents with severe back spasms and pain.  Severe back spasms and pain began after lifting heavy objects post-MRI, leading to an inability to sit. The pain is described as 'screaming' and persists despite Flexeril , which provided partial relief. Spasms and sharp  pain are present in the middle of her back and around her rib cage.  She was diagnosed  with metastatic breast cancer in 2023 and has been treated with Verzenio , Kisqali , HER2, and currently elacestrant . Her tumor marker has increased from 149 to 356.  She is due for an Xgeva  injection, which has previously helped with back pain.     ALLERGIES:  is allergic to aspirin, latex, and nsaids.  MEDICATIONS:  Current Outpatient Medications  Medication Sig Dispense Refill   carvedilol  (COREG ) 3.125 MG tablet TAKE 1 TABLET BY MOUTH TWICE A DAY WITH A MEAL 180 tablet 3   cholecalciferol (VITAMIN D3) 25 MCG (1000 UT) tablet Take 2 tablets (2,000 Units total) by mouth daily. 180 tablet 4   elacestrant  hydrochloride (ORSERDU ) 345 MG tablet Take 1 tablet (345 mg total) by mouth daily. Take with food. 30 tablet 3   ezetimibe  (ZETIA ) 10 MG tablet Take 1 tablet (10 mg total) by mouth daily. 90 tablet 3   gemfibrozil  (LOPID ) 600 MG tablet TAKE 1 TABLET (600 MG TOTAL) BY MOUTH 2 (TWO) TIMES DAILY BEFORE A MEAL. 180 tablet 2   loperamide  (IMODIUM ) 2 MG capsule Take 2 mg by mouth 4 (four) times daily as needed for diarrhea or loose stools. Reported on 11/30/2015     losartan  (COZAAR ) 25 MG tablet Take 0.5 tablets (12.5 mg total) by mouth daily. 45 tablet 3   omeprazole  (PRILOSEC OTC) 20 MG tablet Take 20 mg by mouth daily as needed (acid reflux).     prochlorperazine  (COMPAZINE ) 10 MG tablet Take 10 mg by mouth every 6 (six) hours as needed for nausea or vomiting.     No current facility-administered medications for this visit.    PHYSICAL EXAMINATION: ECOG PERFORMANCE STATUS: 1 - Symptomatic but completely ambulatory  There were no vitals filed for this visit. There were no vitals filed for this visit.  Physical Exam   (exam performed in the presence of a chaperone)  LABORATORY DATA:  I have reviewed the data as listed    Latest Ref Rng & Units 03/01/2024    2:13 PM 01/29/2024    9:53 AM 01/23/2024    1:34 PM  CMP  Glucose 70 - 99 mg/dL 881  899    BUN 8 - 23 mg/dL 21  25    Creatinine  9.55 - 1.00 mg/dL 8.87  8.91  8.89   Sodium 135 - 145 mmol/L 138  140    Potassium 3.5 - 5.1 mmol/L 4.2  4.5    Chloride 98 - 111 mmol/L 106  105    CO2 22 - 32 mmol/L 25  26    Calcium  8.9 - 10.3 mg/dL 9.5  89.5    Total Protein 6.5 - 8.1 g/dL 7.4  8.0    Total Bilirubin 0.0 - 1.2 mg/dL 0.3  0.3    Alkaline Phos 38 - 126 U/L 134  152    AST 15 - 41 U/L 13  15    ALT 0 - 44 U/L 6  8      Lab Results  Component Value Date   WBC 11.3 (H) 03/01/2024   HGB 12.9 03/01/2024   HCT 40.1 03/01/2024   MCV 85.7 03/01/2024   PLT 271 03/01/2024   NEUTROABS 8.9 (H) 03/01/2024    ASSESSMENT & PLAN:  Carcinoma of right breast metastatic to lung Metastatic breast cancer (pericardial effusion, pleural effusion, right middle lobe lung lesion, ER 91%, PR 90%, Ki-67 35%, HER2 negative  diagnosed February 2023)   (Prior history of right mastectomy 2005, adjuvant chemo with Adriamycin Cytoxan and Taxol followed by tamoxifen)   Current treatment:  1. Ribociclib , fulvestrant , Xgeva , ribociclib  discontinued 10/18/2021  2. abemaciclib  started 10/27/2021 3. Enhertu  discontinued for progression after 16 cycles on 06/06/2023 4.  Elacestrant  06/18/2023-03/16/2024  Foundation 1 in 2021: MSI stable, TMB 1, ESR 1 mutation, PTEN, FGF 10, RICTOR amplifications   Guardant360 01/14/2022: No actionable mutations.  ESR 1 mutation. Liver biopsy 02/26/2022: Metastatic carcinoma consistent with breast primary. Caris mutation testing 02/26/2022: HER2 low, ESR 1 mutation, PTEN    Treatment plan: Elacestrant  started 06/18/2023 She has a puppy named Ricky Erle Tzu 7 months old).    08/30/2022: CT chest: Stable moderate right pleural effusion with associated near complete collapse right lower lobe.  Unchanged bone metastases. 11/26/2022: CT CAP: No significant interval change.  Stable right-sided pleural effusion 5 mm lung nodule stable bone mets CA 27-29: 143 (was 695 on 05/23/2022)  03/06/2023: CT chest: Stable, small pleural  effusion, stable tiny lung nodules, stable bone metastasis 03/06/2023: Liver MRI: Multiple hepatic metastases overall decreased in size 05/29/2023: MRI liver: Multiple liver lesions slightly increased in size and conspicuity.  Stable vertebral body metastasis benign left adrenal adenoma 06/05/2023: Unchanged moderate right pleural effusion 01/23/2024: CT CAP: Moderate loculated right pleural effusion, stable liver lesions, stable bone metastasis 03/05/2024: MRI liver: Mixed response to hepatic metastases some are smaller, few new lesions, mild progression of bone metastases    CA 27-29: 356 on 03/01/2024 (was 149 on 09/15/2023) Port Blood clot: Currently on anticoagulation with Eliquis   Severe back spasms: She does not want to get an MRI at this time.  I sent a prescription for muscle relaxants.  Based on evidence of progression of disease, recommend discontinuation of Elacestrant  Perform Guardant360 for determination of any mutations Follow-up with me in 2 weeks to discuss results and determine the treatment plan Assessment & Plan Metastatic right breast carcinoma with bone and liver metastases Mixed response to elacestrant ; some lesions reduced, others new. Tumor marker increased from 149 to 356, indicating progression. Elacestrant  likely ineffective. - Order Gardant 360 blood test to identify mutations for new treatment options. - Administer Xgeva  today for bone pain. - Schedule follow-up in two weeks to discuss Gardant 360 results and determine new treatment plan.  Severe back pain and spasms due to bone metastases Severe back pain and spasms likely due to bone metastases, exacerbated by recent physical activity. Current muscle relaxant dose insufficient. - Prescribe higher dose of Flexeril  (cyclobenzaprine ) with 30 tablets to be [facility] at [facility]. - Advise against taking pain medication with muscle relaxant. - Monitor for worsening symptoms, such as leg weakness, which may necessitate an  MRI.        No orders of the defined types were placed in this encounter.  The patient has a good understanding of the overall plan. she agrees with it. she will call with any problems that may develop before the next visit here. Total time spent: 30 mins including face to face time and time spent for planning, charting and co-ordination of care   Naomi MARLA Chad, MD 03/16/24

## 2024-03-16 NOTE — Assessment & Plan Note (Signed)
 Metastatic breast cancer (pericardial effusion, pleural effusion, right middle lobe lung lesion, ER 91%, PR 90%, Ki-67 35%, HER2 negative diagnosed February 2023)   (Prior history of right mastectomy 2005, adjuvant chemo with Adriamycin Cytoxan and Taxol followed by tamoxifen)   Current treatment:  1. Ribociclib , fulvestrant , Xgeva , ribociclib  discontinued 10/18/2021  2. abemaciclib  started 10/27/2021 3. Enhertu  discontinued for progression after 16 cycles on 06/06/2023 4.  Elacestrant  06/18/2023-03/16/2024  Foundation 1 in 2021: MSI stable, TMB 1, ESR 1 mutation, PTEN, FGF 10, RICTOR amplifications   Guardant360 01/14/2022: No actionable mutations.  ESR 1 mutation. Liver biopsy 02/26/2022: Metastatic carcinoma consistent with breast primary. Caris mutation testing 02/26/2022: HER2 low, ESR 1 mutation, PTEN    Treatment plan: Elacestrant  started 06/18/2023 She has a puppy named Ricky Erle Tzu 7 months old).    08/30/2022: CT chest: Stable moderate right pleural effusion with associated near complete collapse right lower lobe.  Unchanged bone metastases. 11/26/2022: CT CAP: No significant interval change.  Stable right-sided pleural effusion 5 mm lung nodule stable bone mets CA 27-29: 143 (was 695 on 05/23/2022)  03/06/2023: CT chest: Stable, small pleural effusion, stable tiny lung nodules, stable bone metastasis 03/06/2023: Liver MRI: Multiple hepatic metastases overall decreased in size 05/29/2023: MRI liver: Multiple liver lesions slightly increased in size and conspicuity.  Stable vertebral body metastasis benign left adrenal adenoma 06/05/2023: Unchanged moderate right pleural effusion 01/23/2024: CT CAP: Moderate loculated right pleural effusion, stable liver lesions, stable bone metastasis 03/05/2024: MRI liver: Mixed response to hepatic metastases some are smaller, few new lesions, mild progression of bone metastases    CA 27-29: 356 on 03/01/2024 (was 149 on 09/15/2023) Port Blood clot: Currently on  anticoagulation with Eliquis   Based on evidence of progression of disease, recommend discontinuation of Elacestrant  Perform Guardant360 for determination of any mutations

## 2024-03-31 NOTE — Assessment & Plan Note (Signed)
 lesion, ER 91%, PR 90%, Ki-67 35%, HER2 negative diagnosed February 2023)   (Prior history of right mastectomy 2005, adjuvant chemo with Adriamycin Cytoxan and Taxol followed by tamoxifen)   Current treatment:  1. Ribociclib , fulvestrant , Xgeva , ribociclib  discontinued 10/18/2021  2. abemaciclib  started 10/27/2021 3. Enhertu  discontinued for progression after 16 cycles on 06/06/2023 4.  Elacestrant  06/18/2023-03/16/2024   Foundation 1 in 2021: MSI stable, TMB 1, ESR 1 mutation, PTEN, FGF 10, RICTOR amplifications   Guardant360 01/14/2022: No actionable mutations.  ESR 1 mutation. Liver biopsy 02/26/2022: Metastatic carcinoma consistent with breast primary. Caris mutation testing 02/26/2022: HER2 low, ESR 1 mutation, PTEN     Treatment plan: Elacestrant  started 06/18/2023 She has a puppy named Ricky Erle Tzu 7 months old).    08/30/2022: CT chest: Stable moderate right pleural effusion with associated near complete collapse right lower lobe.  Unchanged bone metastases. 11/26/2022: CT CAP: No significant interval change.  Stable right-sided pleural effusion 5 mm lung nodule stable bone mets CA 27-29: 143 (was 695 on 05/23/2022)  03/06/2023: CT chest: Stable, small pleural effusion, stable tiny lung nodules, stable bone metastasis 03/06/2023: Liver MRI: Multiple hepatic metastases overall decreased in size 05/29/2023: MRI liver: Multiple liver lesions slightly increased in size and conspicuity.  Stable vertebral body metastasis benign left adrenal adenoma 06/05/2023: Unchanged moderate right pleural effusion 01/23/2024: CT CAP: Moderate loculated right pleural effusion, stable liver lesions, stable bone metastasis 03/05/2024: MRI liver: Mixed response to hepatic metastases some are smaller, few new lesions, mild progression of bone metastases    CA 27-29: 356 on 03/01/2024 (was 149 on 09/15/2023) Port Blood clot: Currently on anticoagulation with Eliquis  03/18/2024: Guardant360: ESR 1 mutation, RB1 mutation,  PTEN, eGFR, KRAS, T p53 Treatment plan: Capivasertib  with fulvestrant   Severe back spasms: She does not want to get an MRI at this time.  I sent a prescription for muscle relaxants.

## 2024-04-01 ENCOUNTER — Inpatient Hospital Stay: Attending: Hematology and Oncology | Admitting: Hematology and Oncology

## 2024-04-01 ENCOUNTER — Telehealth: Payer: Self-pay | Admitting: Pharmacist

## 2024-04-01 ENCOUNTER — Other Ambulatory Visit (HOSPITAL_COMMUNITY): Payer: Self-pay

## 2024-04-01 ENCOUNTER — Telehealth: Payer: Self-pay

## 2024-04-01 VITALS — BP 125/66 | HR 98 | Temp 98.2°F | Resp 18 | Ht 64.5 in | Wt 154.9 lb

## 2024-04-01 DIAGNOSIS — Z170 Estrogen receptor positive status [ER+]: Secondary | ICD-10-CM | POA: Diagnosis not present

## 2024-04-01 DIAGNOSIS — C7800 Secondary malignant neoplasm of unspecified lung: Secondary | ICD-10-CM

## 2024-04-01 DIAGNOSIS — C7801 Secondary malignant neoplasm of right lung: Secondary | ICD-10-CM | POA: Insufficient documentation

## 2024-04-01 DIAGNOSIS — C7951 Secondary malignant neoplasm of bone: Secondary | ICD-10-CM | POA: Diagnosis not present

## 2024-04-01 DIAGNOSIS — G893 Neoplasm related pain (acute) (chronic): Secondary | ICD-10-CM | POA: Insufficient documentation

## 2024-04-01 DIAGNOSIS — C787 Secondary malignant neoplasm of liver and intrahepatic bile duct: Secondary | ICD-10-CM | POA: Insufficient documentation

## 2024-04-01 DIAGNOSIS — C50911 Malignant neoplasm of unspecified site of right female breast: Secondary | ICD-10-CM

## 2024-04-01 DIAGNOSIS — J90 Pleural effusion, not elsewhere classified: Secondary | ICD-10-CM | POA: Insufficient documentation

## 2024-04-01 DIAGNOSIS — Z5111 Encounter for antineoplastic chemotherapy: Secondary | ICD-10-CM | POA: Insufficient documentation

## 2024-04-01 DIAGNOSIS — C50411 Malignant neoplasm of upper-outer quadrant of right female breast: Secondary | ICD-10-CM | POA: Insufficient documentation

## 2024-04-01 DIAGNOSIS — R799 Abnormal finding of blood chemistry, unspecified: Secondary | ICD-10-CM

## 2024-04-01 DIAGNOSIS — Z7901 Long term (current) use of anticoagulants: Secondary | ICD-10-CM | POA: Diagnosis not present

## 2024-04-01 DIAGNOSIS — M62830 Muscle spasm of back: Secondary | ICD-10-CM | POA: Diagnosis not present

## 2024-04-01 DIAGNOSIS — Z79899 Other long term (current) drug therapy: Secondary | ICD-10-CM | POA: Diagnosis not present

## 2024-04-01 MED ORDER — CAPIVASERTIB 200 MG PO TBPK: 400.0000 mg | ORAL_TABLET | Freq: Two times a day (BID) | ORAL | 3 refills | Status: DC

## 2024-04-01 MED ORDER — CYCLOBENZAPRINE HCL 10 MG PO TABS: 10.0000 mg | ORAL_TABLET | Freq: Three times a day (TID) | ORAL | 0 refills | Status: DC | PRN

## 2024-04-01 NOTE — Telephone Encounter (Signed)
 Oral Oncology Patient Advocate Encounter  After completing a benefits investigation, for Truqap  I confirmed with patient she does not have prescription drug coverage at this time.  I have emailed a PAP Application through [employer] to patient.   Status: Pending application - Waiting on signatures      Charlott Hamilton,  CPhT-Adv  she/her/hers [facility]  [facility] Pharmacy Technician Patient Advocate Specialist III [facility] Phone: [phone]  Fax: [phone] [email or web address]

## 2024-04-01 NOTE — Telephone Encounter (Signed)
 [facility]    Oncology Clinical Pharmacist Practitioner Initial Assessment  Received new prescription for capivasertib  for the treatment of metastatic breast cancer. This is being given in combination with fulvestrant  which will start on 04/08/24 and denosumab  started on 03/16/24. It is planned to continue until disease progression or unacceptable toxicity.  Labs from 03/11/24 assessed, will have repeat labs on 04/08/24 (fasting). Prescription dose and frequency assessed.   Current medication list in Epic reviewed. Significant DDIs with capivasertib  identified:No.  Evaluated chart, patient barriers to medication adherence identified: No.  Patient agreement for treatment documented in MD note on 04/01/24.  Prescription has been e-scribed to the [facility]) for benefits analysis and approval.  Oral Oncology Clinic will continue to follow for insurance authorization, copayment issues, initial counseling and start date.  Scheduling aware that patient needs fasting labs for glucose. May need to move appointments to earlier in the day to accommodate patient (CMP, CBC with diff)  Shraga Custard A. Lucila, PharmD, BCOP, CPP Hematology-Oncology Clinical Pharmacist Practitioner  04/01/2024 4:23 PM  **Disclaimer: This note was dictated with voice recognition software. Similar sounding words can inadvertently be transcribed and this note may contain transcription errors which may not have been corrected upon publication of note.**

## 2024-04-01 NOTE — Progress Notes (Signed)
 Patient Care Team: Dyane Anthony RAMAN, FNP as PCP - General (Family Medicine) Alvan Ronal BRAVO, MD (Inactive) as PCP - Cardiology (Cardiology) Jeffrie Oneil BROCKS, MD (Cardiology) Lucas Dorise POUR, MD (Cardiothoracic Surgery) Lucila Norleen LABOR, RPH-CPP (Pharmacist) Dannielle Alm BRAVO, MD as Referring Physician (Radiation Oncology) Odean Potts, MD as Consulting Physician (Hematology and Oncology)  DIAGNOSIS:  Encounter Diagnosis  Name Primary?   Carcinoma of right breast metastatic to lung (HCC) Yes    SUMMARY OF ONCOLOGIC HISTORY: Oncology History  Carcinoma of right breast metastatic to lung (HCC)  10/10/2011 Initial Diagnosis   Carcinoma of right breast metastatic to lung (HCC)   05/02/2022 - 06/06/2023 Chemotherapy   Patient is on Treatment Plan : BREAST METASTATIC Fam-Trastuzumab Deruxtecan-nxki  (Enhertu ) (5.4) q21d     Malignant neoplasm of upper-outer quadrant of right breast in female, estrogen receptor positive (HCC)  03/22/2004 Initial Diagnosis   right mastectomy and sentinel lymph node sampling 03/22/2004 for a right upper-outer quadrant pT2 pN1, stage IIB invasive ductal carcinoma with lobular features, grade 2, estrogen receptor and progesterone receptor positive, HER-2 negative, with an MIB-1 of 9%   05/2004 - 09/18/2004 Chemotherapy   adjuvant chemotherapy with dose dense doxorubicin and cyclophosphamide x4 cycles (first cycle delayed one week) followed by dose dense paclitaxel x4 was completed 09/18/2004   09/2004 - 2009 Anti-estrogen oral therapy   tamoxifen started March 2006, discontinued 2009   08/2011 Relapse/Recurrence   Metastatic disease:large pericardial effusion, large right pleural effusion and possible right middle lobe bronchial obstruction  status post pericardial window placement, fiberoptic bronchoscopy and right Pleurx placement 09/11/2011, with biopsy of the bronchus intermedius and pericardium positive for metastatic breast cancer, ER 91%, PR 100% p with an MIB-1  of 35%, and no HER-2 amplification, ratio being 1.37   10/10/2011 - 07/2019 Anti-estrogen oral therapy   BOLERO-4 trial 10/10/2011, receiving letrozole  and everolimus  (stopped for progression)   08/16/2019 Miscellaneous   foundation 1 requested on liver biopsy from 08/16/2019 shows a stable microsatellite status with 1 mutation/MB, amplification of C11orf30, FGF10 and RICTOR, with mutations in ESR1 and PTEN   08/16/2019 Miscellaneous   Liver biopsy: Metastatic carcinoma to the liver consistent with breast primary, ER 100%, PR 100%, Ki-67 5%, HER2 2+ by IHC, FISH HER2 negative   08/24/2019 -  Anti-estrogen oral therapy   fulvestrant  started 08/24/2019, repeated every 4 weeks Palbociclib  125 mg daily 21 days on 7 days off started 08/25/2019 Switched to ribociclib  s starting 08/22/2020   02/28/2021 -  Radiation Therapy   palliative radiation (Dr. Dannielle in [place]) 02/28/2021 to right hip area   10/27/2021 -  Anti-estrogen oral therapy   Abemaciclib  with fulvestrant    01/14/2022 Miscellaneous   Guardant360: ESR 1 mutation (no other mutations) MSI high was not detected, PTEN, T p53, EGFR amplification, RB1 mutations detected without any approved treatment options   Malignant neoplasm metastatic to liver (HCC)  08/09/2019 Initial Diagnosis   Malignant neoplasm metastatic to liver (HCC)     CHIEF COMPLIANT: Metastatic breast cancer to discuss new treatment option  HISTORY OF PRESENT ILLNESS:  History of Present Illness Barbara Parks is a 73 year old female with breast cancer who presents for follow-up regarding genetic test results and treatment options.  Genetic testing reveals multiple mutations in her cancer cells, including an RB1 mutation. She has previously been treated with anastrozole and fulvestrant  injections but has not received these recently due to starting chemotherapy.  She experiences significant pain in her left shoulder, worsened by  activities like mopping or cleaning car  windows, managed with ibuprofen and hot showers. Her right shoulder also causes discomfort but is less severe. She has a history of cancer in her shoulder blades and rib cage, which sometimes causes discomfort. She has had radiation therapy in the past, which was beneficial.  She experiences back spasms and uses Flexeril  as needed, with only two tablets remaining. A recent severe spasm was managed with Flexeril  followed by rest.  No recent blood work today, but last week's tests showed a significant increase in her markers.     ALLERGIES:  is allergic to aspirin, latex, and nsaids.  MEDICATIONS:  Current Outpatient Medications  Medication Sig Dispense Refill   carvedilol  (COREG ) 3.125 MG tablet TAKE 1 TABLET BY MOUTH TWICE A DAY WITH A MEAL 180 tablet 3   cholecalciferol (VITAMIN D3) 25 MCG (1000 UT) tablet Take 2 tablets (2,000 Units total) by mouth daily. 180 tablet 4   cyclobenzaprine  (FLEXERIL ) 10 MG tablet Take 1 tablet (10 mg total) by mouth 3 (three) times daily as needed for muscle spasms. 30 tablet 0   ezetimibe  (ZETIA ) 10 MG tablet Take 1 tablet (10 mg total) by mouth daily. 90 tablet 3   gemfibrozil  (LOPID ) 600 MG tablet TAKE 1 TABLET (600 MG TOTAL) BY MOUTH 2 (TWO) TIMES DAILY BEFORE A MEAL. 180 tablet 2   loperamide  (IMODIUM ) 2 MG capsule Take 2 mg by mouth 4 (four) times daily as needed for diarrhea or loose stools. Reported on 11/30/2015     losartan  (COZAAR ) 25 MG tablet Take 0.5 tablets (12.5 mg total) by mouth daily. 45 tablet 3   omeprazole  (PRILOSEC OTC) 20 MG tablet Take 20 mg by mouth daily as needed (acid reflux).     prochlorperazine  (COMPAZINE ) 10 MG tablet Take 10 mg by mouth every 6 (six) hours as needed for nausea or vomiting.     No current facility-administered medications for this visit.    PHYSICAL EXAMINATION: ECOG PERFORMANCE STATUS: 1 - Symptomatic but completely ambulatory  Vitals:   04/01/24 1035  BP: 125/66  Pulse: 98  Resp: 18  Temp: 98.2 F  (36.8 C)  SpO2: 100%   Filed Weights   04/01/24 1035  Weight: 154 lb 14.4 oz (70.3 kg)    Physical Exam MEASUREMENTS: Height- 5'4, Weight- 151 lbs.  (exam performed in the presence of a chaperone)  LABORATORY DATA:  I have reviewed the data as listed    Latest Ref Rng & Units 03/01/2024    2:13 PM 01/29/2024    9:53 AM 01/23/2024    1:34 PM  CMP  Glucose 70 - 99 mg/dL 881  899    BUN 8 - 23 mg/dL 21  25    Creatinine 9.55 - 1.00 mg/dL 8.87  8.91  8.89   Sodium 135 - 145 mmol/L 138  140    Potassium 3.5 - 5.1 mmol/L 4.2  4.5    Chloride 98 - 111 mmol/L 106  105    CO2 22 - 32 mmol/L 25  26    Calcium  8.9 - 10.3 mg/dL 9.5  89.5    Total Protein 6.5 - 8.1 g/dL 7.4  8.0    Total Bilirubin 0.0 - 1.2 mg/dL 0.3  0.3    Alkaline Phos 38 - 126 U/L 134  152    AST 15 - 41 U/L 13  15    ALT 0 - 44 U/L 6  8      Lab Results  Component Value Date   WBC 11.3 (H) 03/01/2024   HGB 12.9 03/01/2024   HCT 40.1 03/01/2024   MCV 85.7 03/01/2024   PLT 271 03/01/2024   NEUTROABS 8.9 (H) 03/01/2024    ASSESSMENT & PLAN:  Carcinoma of right breast metastatic to lung lesion, ER 91%, PR 90%, Ki-67 35%, HER2 negative diagnosed February 2023)   (Prior history of right mastectomy 2005, adjuvant chemo with Adriamycin Cytoxan and Taxol followed by tamoxifen)   Current treatment:  1. Ribociclib , fulvestrant , Xgeva , ribociclib  discontinued 10/18/2021  2. abemaciclib  started 10/27/2021 3. Enhertu  discontinued for progression after 16 cycles on 06/06/2023 4.  Elacestrant  06/18/2023-03/16/2024   Foundation 1 in 2021: MSI stable, TMB 1, ESR 1 mutation, PTEN, FGF 10, RICTOR amplifications   Guardant360 01/14/2022: No actionable mutations.  ESR 1 mutation. Liver biopsy 02/26/2022: Metastatic carcinoma consistent with breast primary. Caris mutation testing 02/26/2022: HER2 low, ESR 1 mutation, PTEN     Treatment plan: Elacestrant  started 06/18/2023 She has a puppy named Ricky Erle Tzu 7 months old).     08/30/2022: CT chest: Stable moderate right pleural effusion with associated near complete collapse right lower lobe.  Unchanged bone metastases. 11/26/2022: CT CAP: No significant interval change.  Stable right-sided pleural effusion 5 mm lung nodule stable bone mets CA 27-29: 143 (was 695 on 05/23/2022)  03/06/2023: CT chest: Stable, small pleural effusion, stable tiny lung nodules, stable bone metastasis 03/06/2023: Liver MRI: Multiple hepatic metastases overall decreased in size 05/29/2023: MRI liver: Multiple liver lesions slightly increased in size and conspicuity.  Stable vertebral body metastasis benign left adrenal adenoma 06/05/2023: Unchanged moderate right pleural effusion 01/23/2024: CT CAP: Moderate loculated right pleural effusion, stable liver lesions, stable bone metastasis 03/05/2024: MRI liver: Mixed response to hepatic metastases some are smaller, few new lesions, mild progression of bone metastases    CA 27-29: 356 on 03/01/2024 (was 149 on 09/15/2023) Port Blood clot: Currently on anticoagulation with Eliquis  03/18/2024: Guardant360: ESR 1 mutation, RB1 mutation, PTEN, eGFR, KRAS, T p53 Treatment plan: Capivasertib  with fulvestrant   Severe back spasms: She does not want to get an MRI at this time.  I renewed her muscle relaxants. Capivasertib  counseling: I discussed with the patient that Capivasertib  is in a KT pathway inhibitor.  Phase 3 clinical trial revealed PFS 7.2 months versus 3.6 months.  Most frequent side effects included grade 3 rash 12%, diarrhea 9%, other effects including nausea fatigue vomiting headaches decreased appetite hyperglycemia stomatitis anemia who also been reported.   Return to clinic in 1 week to start Faslodex , fasting labs along with education with [facility]  Assessment & Plan Metastatic breast cancer with bone, lung, and liver involvement Metastatic breast cancer with identified mutations. ESR1 mutation is no longer relevant, and RB1 mutation indicates  Verzenio  and Ibrance  are ineffective. Capivasertib  is recommended based on the mutation profile. - Initiate capivasertib  at 400 mg (two 200 mg tablets) twice daily for four days on, three days off. - Reintroduce fulvestrant  injections with a bolus schedule: day 1, day 15, day 28, then monthly. - Schedule baseline labs including hemoglobin A1c and lipid panel. - Coordinate with Norleen Islam for medication review and education. - Monitor blood counts and perform periodic scans.  Neoplasm-related pain and muscle spasms due to bone metastases Severe back pain and muscle spasms due to bone metastases. Intermittent use of Flexeril  is effective for muscle spasms. - Prescribe Flexeril  to be available as needed for muscle spasms. - Send prescription for Flexeril  to [facility] pharmacy.  Left  shoulder pain due to bone metastasis Significant pain in the left shoulder, exacerbated by physical activity. Imaging shows a spot on the left scapula and shoulder, likely contributing to the pain. - Discuss the option of radiation therapy for left shoulder pain with her. - She will inform if she decides to proceed with radiation therapy.      No orders of the defined types were placed in this encounter.  The patient has a good understanding of the overall plan. she agrees with it. she will call with any problems that may develop before the next visit here. Total time spent: 45 mins including face to face time and time spent for planning, charting and co-ordination of care   Naomi MARLA Chad, MD 04/01/24

## 2024-04-02 NOTE — Telephone Encounter (Signed)
 Received patient's signed PAP Application, just waiting on Doctor's signature now

## 2024-04-02 NOTE — Telephone Encounter (Signed)
 Oral Oncology Patient Advocate Encounter  Received Dr. Gara signed  PAP Application , Application has been faxed in to [employer].  Status : Pending PAP Application approval

## 2024-04-05 ENCOUNTER — Encounter: Payer: Self-pay | Admitting: Hematology and Oncology

## 2024-04-07 NOTE — Telephone Encounter (Signed)
 Oral Oncology Patient Advocate Encounter  I have received Patient's signature via [employer].

## 2024-04-07 NOTE — Telephone Encounter (Signed)
 Oral Oncology Patient Advocate Encounter   Follow up on PAP Application, [employer] wants a different application filled out and sent in since patient does not have prescription drug coverage. I have LVM with patient and sent her the application via [employer]. Also Need new signature from Dr. Gudena., which has been placed in his mailbox.   Status: Pending Signatures from Patient and Dr.

## 2024-04-08 ENCOUNTER — Inpatient Hospital Stay: Admitting: Pharmacist

## 2024-04-08 ENCOUNTER — Inpatient Hospital Stay

## 2024-04-08 VITALS — BP 121/74 | HR 108 | Resp 18 | Ht 64.5 in | Wt 154.9 lb

## 2024-04-08 DIAGNOSIS — C7951 Secondary malignant neoplasm of bone: Secondary | ICD-10-CM | POA: Diagnosis not present

## 2024-04-08 DIAGNOSIS — C50911 Malignant neoplasm of unspecified site of right female breast: Secondary | ICD-10-CM

## 2024-04-08 DIAGNOSIS — R799 Abnormal finding of blood chemistry, unspecified: Secondary | ICD-10-CM

## 2024-04-08 DIAGNOSIS — Z95828 Presence of other vascular implants and grafts: Secondary | ICD-10-CM

## 2024-04-08 DIAGNOSIS — Z79899 Other long term (current) drug therapy: Secondary | ICD-10-CM | POA: Diagnosis not present

## 2024-04-08 DIAGNOSIS — C7801 Secondary malignant neoplasm of right lung: Secondary | ICD-10-CM | POA: Diagnosis not present

## 2024-04-08 DIAGNOSIS — C787 Secondary malignant neoplasm of liver and intrahepatic bile duct: Secondary | ICD-10-CM | POA: Diagnosis not present

## 2024-04-08 DIAGNOSIS — C50411 Malignant neoplasm of upper-outer quadrant of right female breast: Secondary | ICD-10-CM | POA: Diagnosis not present

## 2024-04-08 DIAGNOSIS — Z5111 Encounter for antineoplastic chemotherapy: Secondary | ICD-10-CM | POA: Diagnosis not present

## 2024-04-08 LAB — CBC WITH DIFFERENTIAL (CANCER CENTER ONLY)
Abs Immature Granulocytes: 0.05 K/uL (ref 0.00–0.07)
Basophils Absolute: 0.1 K/uL (ref 0.0–0.1)
Basophils Relative: 1 %
Eosinophils Absolute: 0.2 K/uL (ref 0.0–0.5)
Eosinophils Relative: 2 %
HCT: 44.7 % (ref 36.0–46.0)
Hemoglobin: 14.6 g/dL (ref 12.0–15.0)
Immature Granulocytes: 1 %
Lymphocytes Relative: 10 %
Lymphs Abs: 0.9 K/uL (ref 0.7–4.0)
MCH: 28.1 pg (ref 26.0–34.0)
MCHC: 32.7 g/dL (ref 30.0–36.0)
MCV: 86.1 fL (ref 80.0–100.0)
Monocytes Absolute: 0.8 K/uL (ref 0.1–1.0)
Monocytes Relative: 9 %
Neutro Abs: 7.2 K/uL (ref 1.7–7.7)
Neutrophils Relative %: 77 %
Platelet Count: 412 K/uL — ABNORMAL HIGH (ref 150–400)
RBC: 5.19 MIL/uL — ABNORMAL HIGH (ref 3.87–5.11)
RDW: 15.9 % — ABNORMAL HIGH (ref 11.5–15.5)
WBC Count: 9.2 K/uL (ref 4.0–10.5)
nRBC: 0 % (ref 0.0–0.2)

## 2024-04-08 LAB — CMP (CANCER CENTER ONLY)
ALT: 7 U/L (ref 0–44)
AST: 15 U/L (ref 15–41)
Albumin: 4.6 g/dL (ref 3.5–5.0)
Alkaline Phosphatase: 163 U/L — ABNORMAL HIGH (ref 38–126)
Anion gap: 9 (ref 5–15)
BUN: 14 mg/dL (ref 8–23)
CO2: 21 mmol/L — ABNORMAL LOW (ref 22–32)
Calcium: 9 mg/dL (ref 8.9–10.3)
Chloride: 110 mmol/L (ref 98–111)
Creatinine: 0.9 mg/dL (ref 0.44–1.00)
GFR, Estimated: >60 mL/min (ref >60)
Glucose, Bld: 99 mg/dL (ref 70–99)
Potassium: 3.9 mmol/L (ref 3.5–5.1)
Sodium: 140 mmol/L (ref 135–145)
Total Bilirubin: 0.3 mg/dL (ref 0.0–1.2)
Total Protein: 8.5 g/dL — ABNORMAL HIGH (ref 6.5–8.1)

## 2024-04-08 LAB — LIPID PANEL
Cholesterol: 253 mg/dL — ABNORMAL HIGH (ref 0–200)
HDL: 46 mg/dL (ref >40)
LDL Cholesterol: 166 mg/dL — ABNORMAL HIGH (ref 0–99)
Total CHOL/HDL Ratio: 5.5 ratio
Triglycerides: 203 mg/dL — ABNORMAL HIGH (ref <150)
VLDL: 41 mg/dL — ABNORMAL HIGH (ref 0–40)

## 2024-04-08 LAB — GUARDANT 360

## 2024-04-08 MED ORDER — FULVESTRANT 250 MG/5ML IM SOSY
500.0000 mg | PREFILLED_SYRINGE | Freq: Once | INTRAMUSCULAR | Status: AC
  Administered 2024-04-08: 500 mg via INTRAMUSCULAR
  Filled 2024-04-08: qty 10

## 2024-04-08 NOTE — Progress Notes (Signed)
 [facility]       Telephone: [phone]?Fax: [phone]   Oncology Clinical Pharmacist Practitioner Initial Assessment  Barbara Parks is a 73 y.o. female with a diagnosis of breast cancer. They were contacted today via in-person visit.  Indication/Regimen Capivasertib  (Truqap ) is being used appropriately for treatment of breast cancer by Dr. Vinay Gudena.      Wt Readings from Last 1 Encounters:  04/01/24 154 lb 14.4 oz (70.3 kg)    Estimated body surface area is 1.79 meters squared as calculated from the following:   Height as of 04/01/24: 5' 4.5 (1.638 m).   Weight as of 04/01/24: 154 lb 14.4 oz (70.3 kg).  Baseline HbA1c - not resulted at time of dictation Baseline FBG - not resulted at time of dictation (labs were fasting today)  The dosing regimen is 400 mg by mouth every 12 hours for 4 consecutive days, followed by 3 days off in combination with fulvestrant  and denosumab  120 mg (due again on 06/08/24). It is planned to continue until disease progression or unacceptable toxicity. Prescription dose and frequency assessed for appropriateness. The treatment goal is: Control.  Patient has agreed to treatment which is documented in physician note on 04/01/24. Counseled patient on administration, dosing, side effects, monitoring, drug-food interactions, safe handling, storage, and disposal.  She will tentatively start next Tuesday (04/13/24) and have fasting labs on that Friday to check blood sugar. PAP forms sent to manufacturer. We will plan on getting labs on Mondays and administer fulvestrant  on Mondays. First dose today, next dose 04/26/24. She is next due for denosumab  120 mg on 06/08/24.  Dose Modifications None   Access Assessment Kallyn Demarcus Raffield will be receiving capivasertib  through PAP Insurance Concerns: none Start date if known: TBD  Adherence Assessment Reviewed importance on keeping a med schedule and plan for any missed doses Barriers to  adherence identified? No  Communication and Learning Assessment Primary learner: Patient Barriers to learning: No barriers Preferred language: English Learning preferences: Listening   Allergies Allergies  Allergen Reactions   Aspirin     Upset stomach   Latex Other (See Comments)    Blistering and skin peels off   Nsaids Nausea And Vomiting    Extreme nausea and vomiting    Vitals    04/01/2024   10:35 AM 03/16/2024    2:00 PM 03/16/2024    1:23 PM  Oncology Vitals  Height 164 cm    Weight 70.262 kg  70.897 kg  Weight (lbs) 154 lbs 14 oz  156 lbs 5 oz  BMI 26.18 kg/m2  26.41 kg/m2  Temp 98.2 F (36.8 C) 98.7 F (37.1 C) 97.4 F (36.3 C)  Pulse Rate 98 93 101  BP 125/66 124/79 127/74  Resp 18 18 18   SpO2 100 % 100 % 99 %  BSA (m2) 1.79 m2  1.8 m2     Laboratory Data    Latest Ref Rng & Units 03/01/2024    2:13 PM 01/29/2024    9:53 AM 09/15/2023    2:23 PM  CBC EXTENDED  WBC 4.0 - 10.5 K/uL 11.3  11.0  9.9   RBC 3.87 - 5.11 MIL/uL 4.68  5.10  4.45   Hemoglobin 12.0 - 15.0 g/dL 87.0  85.6  87.0   HCT 36.0 - 46.0 % 40.1  44.3  39.6   Platelets 150 - 400 K/uL 271  277  265   NEUT# 1.7 - 7.7 K/uL 8.9  8.3  7.6  Lymph# 0.7 - 4.0 K/uL 0.9  1.0  0.8        Latest Ref Rng & Units 03/01/2024    2:13 PM 01/29/2024    9:53 AM 01/23/2024    1:34 PM  CMP  Glucose 70 - 99 mg/dL 881  899    BUN 8 - 23 mg/dL 21  25    Creatinine 9.55 - 1.00 mg/dL 8.87  8.91  8.89   Sodium 135 - 145 mmol/L 138  140    Potassium 3.5 - 5.1 mmol/L 4.2  4.5    Chloride 98 - 111 mmol/L 106  105    CO2 22 - 32 mmol/L 25  26    Calcium  8.9 - 10.3 mg/dL 9.5  89.5    Total Protein 6.5 - 8.1 g/dL 7.4  8.0    Total Bilirubin 0.0 - 1.2 mg/dL 0.3  0.3    Alkaline Phos 38 - 126 U/L 134  152    AST 15 - 41 U/L 13  15    ALT 0 - 44 U/L 6  8     No results found for: MG Lab Results  Component Value Date   CA2729 356.1 (H) 03/01/2024   CA2729 149.0 (H) 09/15/2023   CA2729 198.9 (H) 07/14/2023      Contraindications Contraindications were reviewed? Yes Contraindications to therapy were identified? No   Safety Precautions The following safety precautions for the use of capivasertib  were reviewed:  Fever: reviewed the importance of having a thermometer and the Centers for Disease Control and Prevention (CDC) definition of fever which is 100.69F (38C) or higher. Patient should call 24/7 triage at [phone] if experiencing a fever or any other symptoms Hyperglycemia Diarrhea Cutaneous reactions Possible drug interactions Avoiding grapefruit products Nausea Stomatitis Fatigue Headache UTI Embryo-Fetal toxicity Lactation Dosing instructions review Storage and Handling  Medication Reconciliation Current Outpatient Medications  Medication Sig Dispense Refill   capivasertib  (TRUQAP ) 200 MG pack Take 2 tablets (400 mg total) by mouth 2 (two) times daily. Take for 4 days, then hold for 3 days. Repeat every 7 days. 64 tablet 3   carvedilol  (COREG ) 3.125 MG tablet TAKE 1 TABLET BY MOUTH TWICE A DAY WITH A MEAL 180 tablet 3   cholecalciferol (VITAMIN D3) 25 MCG (1000 UT) tablet Take 2 tablets (2,000 Units total) by mouth daily. 180 tablet 4   cyclobenzaprine  (FLEXERIL ) 10 MG tablet Take 1 tablet (10 mg total) by mouth 3 (three) times daily as needed for muscle spasms. 60 tablet 0   ezetimibe  (ZETIA ) 10 MG tablet Take 1 tablet (10 mg total) by mouth daily. 90 tablet 3   gemfibrozil  (LOPID ) 600 MG tablet TAKE 1 TABLET (600 MG TOTAL) BY MOUTH 2 (TWO) TIMES DAILY BEFORE A MEAL. 180 tablet 2   loperamide  (IMODIUM ) 2 MG capsule Take 2 mg by mouth 4 (four) times daily as needed for diarrhea or loose stools. Reported on 11/30/2015     losartan  (COZAAR ) 25 MG tablet Take 0.5 tablets (12.5 mg total) by mouth daily. 45 tablet 3   omeprazole  (PRILOSEC OTC) 20 MG tablet Take 20 mg by mouth daily as needed (acid reflux).     prochlorperazine  (COMPAZINE ) 10 MG tablet Take 10 mg by mouth every 6  (six) hours as needed for nausea or vomiting.     No current facility-administered medications for this visit.    Medication reconciliation is based on the patient's most recent medication list in the electronic medical record (EMR) including  herbal products and OTC medications.   The patient's medication list was reviewed today with the patient? Yes   Drug-drug interactions (DDIs) DDIs were evaluated? Yes Significant DDIs identified? No   Drug-Food Interactions Drug-food interactions were evaluated? Yes Drug-food interactions identified? Avoid grapefruit products  Follow-up Plan  Patient education handout given to patient Start capivasertib  (2) tablets (400 mg) by mouth every 12 hours for 4 days (Tuesday-Friday), followed by a 3 day rest period (Saturday-Monday). Repeat every 7 days. Start once received. Tentatively start 04/13/24) Start fulvestrant  500 mg IM every 28 days after loading doses complete. Starting today Continue denosumab  120 mg SubQ every 12 weeks. Next due 06/08/24 Monitor platelets, increased from last visit. Will recheck on 04/16/24 or next lab visit. Fasting labs to check blood sugar next Friday (04/16/24) Will start cetirizine 10 mg by mouth daily for cutaneous reaction prevention Ambulatory referral sent to palliative care per patient request due to increased pain Monitor for side effects Distress thermometer not applicable as existing patient Add labs, Dr. Odean visit, and fulvestrant  dose # 2 for 04/26/24 Rock Lis Tiegs can follow up with clinical pharmacy as deemed necessary by Dr. Mackey Odean going forward   Rock Lis Eccleston participated in the discussion, expressed understanding, and voiced agreement with the above plan. All questions were answered to her satisfaction. The patient was advised to contact the clinic at [phone] with any questions or concerns prior to her return visit.   I spent 30 minutes assessing the patient.  Abriel Hattery A. Lucila,  PharmD, BCOP, CPP  Norleen DELENA Lucila, RPH-CPP, 04/08/2024 12:47 PM  **Disclaimer: This note was dictated with voice recognition software. Similar sounding words can inadvertently be transcribed and this note may contain transcription errors which may not have been corrected upon publication of note.**

## 2024-04-08 NOTE — Telephone Encounter (Signed)
 Oral Oncology Patient Advocate Encounter  I received Dr. Gara England and the completed application has been faxed to [employer].  Status:Pending Approval

## 2024-04-09 LAB — HEMOGLOBIN A1C
Hgb A1c MFr Bld: 5.7 % — ABNORMAL HIGH (ref 4.8–5.6)
Mean Plasma Glucose: 116.89 mg/dL

## 2024-04-09 LAB — CANCER ANTIGEN 27.29: CA 27.29: 521.6 U/mL — ABNORMAL HIGH (ref 0.0–38.6)

## 2024-04-09 NOTE — Telephone Encounter (Signed)
 Oral Oncology Patient Advocate Encounter  Additional Documentation requested by [employer]; proof of grant closures. This information has been faxed in. F# [phone]   Status: Pending Approval

## 2024-04-13 ENCOUNTER — Other Ambulatory Visit: Payer: Self-pay | Admitting: Pharmacist

## 2024-04-13 ENCOUNTER — Telehealth: Payer: Self-pay | Admitting: Pharmacy Technician

## 2024-04-13 NOTE — Telephone Encounter (Signed)
 Oral Oncology Patient Advocate Encounter   Received notification that the application for assistance for Truqap  through [employer] has been approved.   [employer] phone number [phone].   Effective dates: 04/12/2024 through 07/28/2025  Medication will be filled at [facility].  Zephyr Sausedo (Patty) Chet Burnet, CPhT  [facility], Zelda Salmon, [facility] - Oral Chemotherapy Patient Advocate Specialist III Phone: [phone]  Fax: [phone]

## 2024-04-16 ENCOUNTER — Inpatient Hospital Stay

## 2024-04-16 DIAGNOSIS — C787 Secondary malignant neoplasm of liver and intrahepatic bile duct: Secondary | ICD-10-CM | POA: Diagnosis not present

## 2024-04-16 DIAGNOSIS — C50911 Malignant neoplasm of unspecified site of right female breast: Secondary | ICD-10-CM

## 2024-04-16 DIAGNOSIS — C7951 Secondary malignant neoplasm of bone: Secondary | ICD-10-CM | POA: Diagnosis not present

## 2024-04-16 DIAGNOSIS — Z79899 Other long term (current) drug therapy: Secondary | ICD-10-CM | POA: Diagnosis not present

## 2024-04-16 DIAGNOSIS — C7801 Secondary malignant neoplasm of right lung: Secondary | ICD-10-CM | POA: Diagnosis not present

## 2024-04-16 DIAGNOSIS — C50411 Malignant neoplasm of upper-outer quadrant of right female breast: Secondary | ICD-10-CM | POA: Diagnosis not present

## 2024-04-16 DIAGNOSIS — Z5111 Encounter for antineoplastic chemotherapy: Secondary | ICD-10-CM | POA: Diagnosis not present

## 2024-04-16 LAB — CMP (CANCER CENTER ONLY)
ALT: 7 U/L (ref 0–44)
AST: 15 U/L (ref 15–41)
Albumin: 4.3 g/dL (ref 3.5–5.0)
Alkaline Phosphatase: 144 U/L — ABNORMAL HIGH (ref 38–126)
Anion gap: 7 (ref 5–15)
BUN: 15 mg/dL (ref 8–23)
CO2: 20 mmol/L — ABNORMAL LOW (ref 22–32)
Calcium: 9 mg/dL (ref 8.9–10.3)
Chloride: 112 mmol/L — ABNORMAL HIGH (ref 98–111)
Creatinine: 0.94 mg/dL (ref 0.44–1.00)
GFR, Estimated: >60 mL/min (ref >60)
Glucose, Bld: 143 mg/dL — ABNORMAL HIGH (ref 70–99)
Potassium: 4 mmol/L (ref 3.5–5.1)
Sodium: 139 mmol/L (ref 135–145)
Total Bilirubin: 0.3 mg/dL (ref 0.0–1.2)
Total Protein: 7.8 g/dL (ref 6.5–8.1)

## 2024-04-16 LAB — CBC WITH DIFFERENTIAL (CANCER CENTER ONLY)
Abs Immature Granulocytes: 0.04 K/uL (ref 0.00–0.07)
Basophils Absolute: 0.1 K/uL (ref 0.0–0.1)
Basophils Relative: 1 %
Eosinophils Absolute: 0.3 K/uL (ref 0.0–0.5)
Eosinophils Relative: 3 %
HCT: 42.1 % (ref 36.0–46.0)
Hemoglobin: 13.5 g/dL (ref 12.0–15.0)
Immature Granulocytes: 0 %
Lymphocytes Relative: 8 %
Lymphs Abs: 0.9 K/uL (ref 0.7–4.0)
MCH: 27.7 pg (ref 26.0–34.0)
MCHC: 32.1 g/dL (ref 30.0–36.0)
MCV: 86.3 fL (ref 80.0–100.0)
Monocytes Absolute: 0.8 K/uL (ref 0.1–1.0)
Monocytes Relative: 8 %
Neutro Abs: 8.4 K/uL — ABNORMAL HIGH (ref 1.7–7.7)
Neutrophils Relative %: 80 %
Platelet Count: 354 K/uL (ref 150–400)
RBC: 4.88 MIL/uL (ref 3.87–5.11)
RDW: 16.1 % — ABNORMAL HIGH (ref 11.5–15.5)
WBC Count: 10.5 K/uL (ref 4.0–10.5)
nRBC: 0 % (ref 0.0–0.2)

## 2024-04-17 ENCOUNTER — Other Ambulatory Visit: Payer: Self-pay | Admitting: Adult Health

## 2024-04-17 DIAGNOSIS — Z170 Estrogen receptor positive status [ER+]: Secondary | ICD-10-CM

## 2024-04-17 DIAGNOSIS — C50911 Malignant neoplasm of unspecified site of right female breast: Secondary | ICD-10-CM

## 2024-04-17 DIAGNOSIS — I3131 Malignant pericardial effusion in diseases classified elsewhere: Secondary | ICD-10-CM

## 2024-04-17 LAB — CANCER ANTIGEN 27.29: CA 27.29: 438.1 U/mL — ABNORMAL HIGH (ref 0.0–38.6)

## 2024-04-26 ENCOUNTER — Encounter: Payer: Self-pay | Admitting: Hematology and Oncology

## 2024-04-26 ENCOUNTER — Inpatient Hospital Stay

## 2024-04-26 ENCOUNTER — Ambulatory Visit (HOSPITAL_COMMUNITY)
Admission: RE | Admit: 2024-04-26 | Discharge: 2024-04-26 | Disposition: A | Discharge status: Home / Self Care | Source: Ambulatory Visit | Attending: Hematology and Oncology | Admitting: Hematology and Oncology

## 2024-04-26 ENCOUNTER — Inpatient Hospital Stay (HOSPITAL_BASED_OUTPATIENT_CLINIC_OR_DEPARTMENT_OTHER): Admitting: Hematology and Oncology

## 2024-04-26 VITALS — BP 117/69 | HR 104 | Temp 97.6°F | Resp 18 | Ht 64.5 in | Wt 152.1 lb

## 2024-04-26 DIAGNOSIS — C7951 Secondary malignant neoplasm of bone: Secondary | ICD-10-CM

## 2024-04-26 DIAGNOSIS — I3131 Malignant pericardial effusion in diseases classified elsewhere: Secondary | ICD-10-CM | POA: Diagnosis not present

## 2024-04-26 DIAGNOSIS — Z79899 Other long term (current) drug therapy: Secondary | ICD-10-CM | POA: Diagnosis not present

## 2024-04-26 DIAGNOSIS — Z5111 Encounter for antineoplastic chemotherapy: Secondary | ICD-10-CM | POA: Diagnosis not present

## 2024-04-26 DIAGNOSIS — C50911 Malignant neoplasm of unspecified site of right female breast: Secondary | ICD-10-CM

## 2024-04-26 DIAGNOSIS — C787 Secondary malignant neoplasm of liver and intrahepatic bile duct: Secondary | ICD-10-CM | POA: Diagnosis not present

## 2024-04-26 DIAGNOSIS — C50411 Malignant neoplasm of upper-outer quadrant of right female breast: Secondary | ICD-10-CM

## 2024-04-26 DIAGNOSIS — R0602 Shortness of breath: Secondary | ICD-10-CM | POA: Diagnosis not present

## 2024-04-26 DIAGNOSIS — Z170 Estrogen receptor positive status [ER+]: Secondary | ICD-10-CM

## 2024-04-26 DIAGNOSIS — C7800 Secondary malignant neoplasm of unspecified lung: Secondary | ICD-10-CM | POA: Diagnosis not present

## 2024-04-26 DIAGNOSIS — Z95828 Presence of other vascular implants and grafts: Secondary | ICD-10-CM

## 2024-04-26 DIAGNOSIS — C7801 Secondary malignant neoplasm of right lung: Secondary | ICD-10-CM | POA: Diagnosis not present

## 2024-04-26 LAB — CBC WITH DIFFERENTIAL (CANCER CENTER ONLY)
Abs Immature Granulocytes: 0.08 K/uL — ABNORMAL HIGH (ref 0.00–0.07)
Basophils Absolute: 0.1 K/uL (ref 0.0–0.1)
Basophils Relative: 1 %
Eosinophils Absolute: 0.2 K/uL (ref 0.0–0.5)
Eosinophils Relative: 1 %
HCT: 46 % (ref 36.0–46.0)
Hemoglobin: 14.6 g/dL (ref 12.0–15.0)
Immature Granulocytes: 1 %
Lymphocytes Relative: 4 %
Lymphs Abs: 0.7 K/uL (ref 0.7–4.0)
MCH: 27.5 pg (ref 26.0–34.0)
MCHC: 31.7 g/dL (ref 30.0–36.0)
MCV: 86.6 fL (ref 80.0–100.0)
Monocytes Absolute: 1.6 K/uL — ABNORMAL HIGH (ref 0.1–1.0)
Monocytes Relative: 10 %
Neutro Abs: 13.9 K/uL — ABNORMAL HIGH (ref 1.7–7.7)
Neutrophils Relative %: 83 %
Platelet Count: 326 K/uL (ref 150–400)
RBC: 5.31 MIL/uL — ABNORMAL HIGH (ref 3.87–5.11)
RDW: 16.3 % — ABNORMAL HIGH (ref 11.5–15.5)
WBC Count: 16.5 K/uL — ABNORMAL HIGH (ref 4.0–10.5)
nRBC: 0 % (ref 0.0–0.2)

## 2024-04-26 LAB — CMP (CANCER CENTER ONLY)
ALT: 11 U/L (ref 0–44)
AST: 25 U/L (ref 15–41)
Albumin: 4.6 g/dL (ref 3.5–5.0)
Alkaline Phosphatase: 181 U/L — ABNORMAL HIGH (ref 38–126)
Anion gap: 12 (ref 5–15)
BUN: 25 mg/dL — ABNORMAL HIGH (ref 8–23)
CO2: 19 mmol/L — ABNORMAL LOW (ref 22–32)
Calcium: 9.2 mg/dL (ref 8.9–10.3)
Chloride: 107 mmol/L (ref 98–111)
Creatinine: 1.15 mg/dL — ABNORMAL HIGH (ref 0.44–1.00)
GFR, Estimated: 50 mL/min — ABNORMAL LOW (ref >60)
Glucose, Bld: 120 mg/dL — ABNORMAL HIGH (ref 70–99)
Potassium: 3.9 mmol/L (ref 3.5–5.1)
Sodium: 138 mmol/L (ref 135–145)
Total Bilirubin: 0.8 mg/dL (ref 0.0–1.2)
Total Protein: 8.7 g/dL — ABNORMAL HIGH (ref 6.5–8.1)

## 2024-04-26 MED ORDER — CARVEDILOL 3.125 MG PO TABS: 3.1250 mg | ORAL_TABLET | Freq: Two times a day (BID) | ORAL | 3 refills | Status: DC

## 2024-04-26 MED ORDER — FULVESTRANT 250 MG/5ML IM SOSY
500.0000 mg | PREFILLED_SYRINGE | Freq: Once | INTRAMUSCULAR | Status: AC
  Administered 2024-04-26: 500 mg via INTRAMUSCULAR

## 2024-04-26 MED ORDER — OXYCODONE-ACETAMINOPHEN 5-325 MG PO TABS: 1.0000 | ORAL_TABLET | Freq: Three times a day (TID) | ORAL | 0 refills | Status: DC | PRN

## 2024-04-26 NOTE — Assessment & Plan Note (Addendum)
 Metastatic breast cancer (pericardial effusion, pleural effusion, right middle lobe lung lesion, ER 91%, PR 90%, Ki-67 35%, HER2 negative diagnosed February 2023)   (Prior history of right mastectomy 2005, adjuvant chemo with Adriamycin Cytoxan and Taxol followed by tamoxifen)   Current treatment:  1. Ribociclib , fulvestrant , Xgeva , ribociclib  discontinued 10/18/2021  2. abemaciclib  started 10/27/2021 3. Enhertu  discontinued for progression after 16 cycles on 06/06/2023 4.  Elacestrant  06/18/2023-03/16/2024   Foundation 1 in 2021: MSI stable, TMB 1, ESR 1 mutation, PTEN, FGF 10, RICTOR amplifications   Guardant360 01/14/2022: No actionable mutations.  ESR 1 mutation. Liver biopsy 02/26/2022: Metastatic carcinoma consistent with breast primary. Caris mutation testing 02/26/2022: HER2 low, ESR 1 mutation, PTEN She has a puppy named Ricky Erle Tzu 7 months old).    08/30/2022: CT chest: Stable moderate right pleural effusion with associated near complete collapse right lower lobe.  Unchanged bone metastases. 11/26/2022: CT CAP: No significant interval change.  Stable right-sided pleural effusion 5 mm lung nodule stable bone mets CA 27-29: 143 (was 695 on 05/23/2022)  03/06/2023: CT chest: Stable, small pleural effusion, stable tiny lung nodules, stable bone metastasis 03/06/2023: Liver MRI: Multiple hepatic metastases overall decreased in size 05/29/2023: MRI liver: Multiple liver lesions slightly increased in size and conspicuity.  Stable vertebral body metastasis benign left adrenal adenoma 06/05/2023: Unchanged moderate right pleural effusion 01/23/2024: CT CAP: Moderate loculated right pleural effusion, stable liver lesions, stable bone metastasis 03/05/2024: MRI liver: Mixed response to hepatic metastases some are smaller, few new lesions, mild progression of bone metastases    CA 27-29: 356 on 03/01/2024 (was 149 on 09/15/2023) Port Blood clot: Currently on anticoagulation with Eliquis    Severe back  spasms: She does not want to get an MRI at this time.  Current treatment: Capivasertib  started 04/13/2024 Capivasertib  toxicities:  Left shoulder pain: Recommended radiation.  Patient is undecided

## 2024-04-26 NOTE — Progress Notes (Signed)
 Patient Care Team: Dyane Anthony RAMAN, FNP as PCP - General (Family Medicine) Alvan Ronal BRAVO, MD (Inactive) as PCP - Cardiology (Cardiology) Jeffrie Oneil BROCKS, MD (Cardiology) Lucas Dorise POUR, MD (Cardiothoracic Surgery) Lucila Norleen LABOR, RPH-CPP (Pharmacist) Dannielle Alm BRAVO, MD as Referring Physician (Radiation Oncology) Odean Potts, MD as Consulting Physician (Hematology and Oncology)  DIAGNOSIS:  Encounter Diagnosis  Name Primary?   Malignant neoplasm of upper-outer quadrant of right breast in female, estrogen receptor positive (HCC) Yes    SUMMARY OF ONCOLOGIC HISTORY: Oncology History  Carcinoma of right breast metastatic to lung (HCC)  10/10/2011 Initial Diagnosis   Carcinoma of right breast metastatic to lung (HCC)   05/02/2022 - 06/06/2023 Chemotherapy   Patient is on Treatment Plan : BREAST METASTATIC Fam-Trastuzumab Deruxtecan-nxki  (Enhertu ) (5.4) q21d     Malignant neoplasm of upper-outer quadrant of right breast in female, estrogen receptor positive (HCC)  03/22/2004 Initial Diagnosis   right mastectomy and sentinel lymph node sampling 03/22/2004 for a right upper-outer quadrant pT2 pN1, stage IIB invasive ductal carcinoma with lobular features, grade 2, estrogen receptor and progesterone receptor positive, HER-2 negative, with an MIB-1 of 9%   05/2004 - 09/18/2004 Chemotherapy   adjuvant chemotherapy with dose dense doxorubicin and cyclophosphamide x4 cycles (first cycle delayed one week) followed by dose dense paclitaxel x4 was completed 09/18/2004   09/2004 - 2009 Anti-estrogen oral therapy   tamoxifen started March 2006, discontinued 2009   08/2011 Relapse/Recurrence   Metastatic disease:large pericardial effusion, large right pleural effusion and possible right middle lobe bronchial obstruction  status post pericardial window placement, fiberoptic bronchoscopy and right Pleurx placement 09/11/2011, with biopsy of the bronchus intermedius and pericardium positive for  metastatic breast cancer, ER 91%, PR 100% p with an MIB-1 of 35%, and no HER-2 amplification, ratio being 1.37   10/10/2011 - 07/2019 Anti-estrogen oral therapy   BOLERO-4 trial 10/10/2011, receiving letrozole  and everolimus  (stopped for progression)   08/16/2019 Miscellaneous   foundation 1 requested on liver biopsy from 08/16/2019 shows a stable microsatellite status with 1 mutation/MB, amplification of C11orf30, FGF10 and RICTOR, with mutations in ESR1 and PTEN   08/16/2019 Miscellaneous   Liver biopsy: Metastatic carcinoma to the liver consistent with breast primary, ER 100%, PR 100%, Ki-67 5%, HER2 2+ by IHC, FISH HER2 negative   08/24/2019 -  Anti-estrogen oral therapy   fulvestrant  started 08/24/2019, repeated every 4 weeks Palbociclib  125 mg daily 21 days on 7 days off started 08/25/2019 Switched to ribociclib  s starting 08/22/2020   02/28/2021 -  Radiation Therapy   palliative radiation (Dr. Dannielle in [place]) 02/28/2021 to right hip area   10/27/2021 -  Anti-estrogen oral therapy   Abemaciclib  with fulvestrant    01/14/2022 Miscellaneous   Guardant360: ESR 1 mutation (no other mutations) MSI high was not detected, PTEN, T p53, EGFR amplification, RB1 mutations detected without any approved treatment options   Malignant neoplasm metastatic to liver (HCC)  08/09/2019 Initial Diagnosis   Malignant neoplasm metastatic to liver (HCC)     CHIEF COMPLIANT:   HISTORY OF PRESENT ILLNESS: Discussed the use of AI scribe software for clinical note transcription with the patient, who gave verbal consent to proceed.  History of Present Illness Barbara Parks is a 73 year old female with cancer who presents with severe back pain and diarrhea.  Severe back pain began on Sunday morning at 2 AM, originating in the mid-back and radiating around the ribcage. The pain is described as pounding and stabbing, continuous and  throbbing, with a current intensity of 5 out of 10. It is associated with  difficulty breathing deeply and an increased heart rate. Flexeril  provides some relief.  Diarrhea started after beginning a new medication last Tuesday, occurring in the mornings and resolving by Friday. It was less severe than previous episodes.  She experiences shortness of breath.     ALLERGIES:  is allergic to aspirin, latex, and nsaids.  MEDICATIONS:  Current Outpatient Medications  Medication Sig Dispense Refill   capivasertib  (TRUQAP ) 200 MG pack Take 2 tablets (400 mg total) by mouth 2 (two) times daily. Take for 4 days, then hold for 3 days. Repeat every 7 days.     carvedilol  (COREG ) 3.125 MG tablet TAKE 1 TABLET BY MOUTH TWICE A DAY WITH A MEAL 180 tablet 3   cetirizine (ZYRTEC) 10 MG tablet Take 10 mg by mouth daily.     cholecalciferol (VITAMIN D3) 25 MCG (1000 UT) tablet Take 2 tablets (2,000 Units total) by mouth daily. 180 tablet 4   cyclobenzaprine  (FLEXERIL ) 10 MG tablet Take 1 tablet (10 mg total) by mouth 3 (three) times daily as needed for muscle spasms. 60 tablet 0   gemfibrozil  (LOPID ) 600 MG tablet TAKE 1 TABLET (600 MG TOTAL) BY MOUTH 2 (TWO) TIMES DAILY BEFORE A MEAL. 180 tablet 2   loperamide  (IMODIUM ) 2 MG capsule Take 2 mg by mouth 4 (four) times daily as needed for diarrhea or loose stools. Reported on 11/30/2015     omeprazole  (PRILOSEC OTC) 20 MG tablet Take 20 mg by mouth daily as needed (acid reflux).     prochlorperazine  (COMPAZINE ) 10 MG tablet Take 10 mg by mouth every 6 (six) hours as needed for nausea or vomiting.     No current facility-administered medications for this visit.    PHYSICAL EXAMINATION: ECOG PERFORMANCE STATUS: 1 - Symptomatic but completely ambulatory  Vitals:   04/26/24 0926  BP: 117/69  Pulse: (!) 115  Resp: 18  Temp: 97.6 F (36.4 C)  SpO2: 97%   Filed Weights   04/26/24 0926  Weight: 152 lb 1.6 oz (69 kg)    Physical Exam   (exam performed in the presence of a chaperone)  LABORATORY DATA:  I have reviewed the  data as listed    Latest Ref Rng & Units 04/16/2024    9:49 AM 04/08/2024   12:41 PM 03/01/2024    2:13 PM  CMP  Glucose 70 - 99 mg/dL 856  99  881   BUN 8 - 23 mg/dL 15  14  21    Creatinine 0.44 - 1.00 mg/dL 9.05  9.09  8.87   Sodium 135 - 145 mmol/L 139  140  138   Potassium 3.5 - 5.1 mmol/L 4.0  3.9  4.2   Chloride 98 - 111 mmol/L 112  110  106   CO2 22 - 32 mmol/L 20  21  25    Calcium  8.9 - 10.3 mg/dL 9.0  9.0  9.5   Total Protein 6.5 - 8.1 g/dL 7.8  8.5  7.4   Total Bilirubin 0.0 - 1.2 mg/dL 0.3  0.3  0.3   Alkaline Phos 38 - 126 U/L 144  163  134   AST 15 - 41 U/L 15  15  13    ALT 0 - 44 U/L 7  7  6      Lab Results  Component Value Date   WBC 16.5 (H) 04/26/2024   HGB 14.6 04/26/2024   HCT 46.0 04/26/2024  MCV 86.6 04/26/2024   PLT 326 04/26/2024   NEUTROABS 13.9 (H) 04/26/2024    ASSESSMENT & PLAN:  Malignant neoplasm of upper-outer quadrant of right breast in female, estrogen receptor positive (HCC) Metastatic breast cancer (pericardial effusion, pleural effusion, right middle lobe lung lesion, ER 91%, PR 90%, Ki-67 35%, HER2 negative diagnosed February 2023)   (Prior history of right mastectomy 2005, adjuvant chemo with Adriamycin Cytoxan and Taxol followed by tamoxifen)   Current treatment:  1. Ribociclib , fulvestrant , Xgeva , ribociclib  discontinued 10/18/2021  2. abemaciclib  started 10/27/2021 3. Enhertu  discontinued for progression after 16 cycles on 06/06/2023 4.  Elacestrant  06/18/2023-03/16/2024   Foundation 1 in 2021: MSI stable, TMB 1, ESR 1 mutation, PTEN, FGF 10, RICTOR amplifications   Guardant360 01/14/2022: No actionable mutations.  ESR 1 mutation. Liver biopsy 02/26/2022: Metastatic carcinoma consistent with breast primary. Caris mutation testing 02/26/2022: HER2 low, ESR 1 mutation, PTEN She has a puppy named Ricky Erle Tzu 7 months old).    08/30/2022: CT chest: Stable moderate right pleural effusion with associated near complete collapse right lower  lobe.  Unchanged bone metastases. 11/26/2022: CT CAP: No significant interval change.  Stable right-sided pleural effusion 5 mm lung nodule stable bone mets CA 27-29: 143 (was 695 on 05/23/2022)  03/06/2023: CT chest: Stable, small pleural effusion, stable tiny lung nodules, stable bone metastasis 03/06/2023: Liver MRI: Multiple hepatic metastases overall decreased in size 05/29/2023: MRI liver: Multiple liver lesions slightly increased in size and conspicuity.  Stable vertebral body metastasis benign left adrenal adenoma 06/05/2023: Unchanged moderate right pleural effusion 01/23/2024: CT CAP: Moderate loculated right pleural effusion, stable liver lesions, stable bone metastasis 03/05/2024: MRI liver: Mixed response to hepatic metastases some are smaller, few new lesions, mild progression of bone metastases    CA 27-29: 356 on 03/01/2024 (was 149 on 09/15/2023) Port Blood clot: Currently on anticoagulation with Eliquis    Severe back spasms: She does not want to get an MRI at this time.  Current treatment: Capivasertib  started 04/13/2024 Capivasertib  toxicities: Intermittent diarrhea Fatigue  Severe mid to lower back pain: Since last 4 days.  I would like to get an MRI of her lumbar spine. I sent a prescription for Percocets.  Left shoulder pain: Recommended radiation.  Patient is undecided I will see her back in October at her next injection to discuss the symptoms.   Assessment & Plan Metastatic carcinoma of right breast with bone, liver, and lung metastases Metastatic carcinoma of the right breast with metastases to bone, liver, and lung. Current treatment includes a new oral medication started last Tuesday. Awaiting results of tumor markers and liver function tests. - Await results of tumor markers and liver function tests.  Neoplasm-related pain and muscle spasms due to bone metastases Severe pain in mid-back likely related to bone metastases. Muscle relaxants provide insufficient relief.  Consider radiation therapy if pain is due to cancerous lesions. - Order MRI to evaluate for potential cancerous lesions causing pain. - Prescribe Percocet for pain management. - Consider radiation therapy if MRI shows cancerous lesions.  Right shoulder pain likely referred from pleural effusion Right shoulder pain improved, likely referred from pleural effusion. No bone issues on previous CT scan. - Order chest x-ray to assess for pleural effusion.  Diarrhea secondary to cancer therapy Diarrhea noted after starting new cancer therapy.       No orders of the defined types were placed in this encounter.  The patient has a good understanding of the overall plan. she agrees with it.  she will call with any problems that may develop before the next visit here. Total time spent: 30 mins including face to face time and time spent for planning, charting and co-ordination of care   Naomi MARLA Chad, MD 04/26/24

## 2024-04-27 LAB — CANCER ANTIGEN 27.29: CA 27.29: 515.3 U/mL — ABNORMAL HIGH (ref 0.0–38.6)

## 2024-04-29 ENCOUNTER — Ambulatory Visit (HOSPITAL_COMMUNITY)
Admission: RE | Admit: 2024-04-29 | Discharge: 2024-04-29 | Disposition: A | Discharge status: Home / Self Care | Source: Ambulatory Visit | Attending: Hematology and Oncology | Admitting: Hematology and Oncology

## 2024-04-29 ENCOUNTER — Other Ambulatory Visit: Payer: Self-pay | Admitting: Hematology and Oncology

## 2024-04-29 DIAGNOSIS — C7951 Secondary malignant neoplasm of bone: Secondary | ICD-10-CM

## 2024-04-29 DIAGNOSIS — C50411 Malignant neoplasm of upper-outer quadrant of right female breast: Secondary | ICD-10-CM | POA: Diagnosis not present

## 2024-04-29 DIAGNOSIS — K769 Liver disease, unspecified: Secondary | ICD-10-CM | POA: Diagnosis not present

## 2024-04-29 DIAGNOSIS — I3131 Malignant pericardial effusion in diseases classified elsewhere: Secondary | ICD-10-CM

## 2024-04-29 DIAGNOSIS — Z170 Estrogen receptor positive status [ER+]: Secondary | ICD-10-CM | POA: Diagnosis not present

## 2024-04-29 DIAGNOSIS — M4856XA Collapsed vertebra, not elsewhere classified, lumbar region, initial encounter for fracture: Secondary | ICD-10-CM

## 2024-04-29 DIAGNOSIS — C50911 Malignant neoplasm of unspecified site of right female breast: Secondary | ICD-10-CM

## 2024-04-29 DIAGNOSIS — R0602 Shortness of breath: Secondary | ICD-10-CM

## 2024-04-29 MED ORDER — GADOBUTROL 1 MMOL/ML IV SOLN
7.0000 mL | Freq: Once | INTRAVENOUS | Status: AC | PRN
  Administered 2024-04-29: 7 mL via INTRAVENOUS

## 2024-05-06 ENCOUNTER — Other Ambulatory Visit: Payer: Self-pay | Admitting: *Deleted

## 2024-05-06 ENCOUNTER — Telehealth: Payer: Self-pay

## 2024-05-06 ENCOUNTER — Encounter: Payer: Self-pay | Admitting: Hematology and Oncology

## 2024-05-06 ENCOUNTER — Other Ambulatory Visit (HOSPITAL_BASED_OUTPATIENT_CLINIC_OR_DEPARTMENT_OTHER): Payer: Self-pay

## 2024-05-06 MED ORDER — NYSTATIN 100000 UNIT/ML MT SUSP
5.0000 mL | Freq: Four times a day (QID) | OROMUCOSAL | 2 refills | Status: DC | PRN
  Filled 2024-05-06: qty 240, 12d supply, fill #0

## 2024-05-06 NOTE — Progress Notes (Signed)
 Received call from pt with complaint of mouth sores x several days.  Pt states she has been using warm salt water rinses with no relief.  RN reviewed with MD and verbal orders received and placed for pt to be prescribed magic mouthwash.  Prescription sent to pharmacy on file. Pt educated and verbalized understanding.

## 2024-05-06 NOTE — Telephone Encounter (Addendum)
 PGY2 Oncology Pharmacy Intervention   Intervention Patient called Dr. Gara office regarding the development of mouth sores on their lips over the that is spreading to the roof of their mouth. Patient reported symptoms are at the moment locally contained to their mouth and lips.   Action Recommended Magic Mouth wash for the treatment of the patient's mucositis  Rx was called in to [place] [facility] pharmacy  Counseled patient on the use of their Magic Mouth wash Discussed with patient the different components of the mouth wash (Maalox - pH balance to help ingredients coat the mouth, Lidocaine  - a numbing agent, Nystatin - tx oral thrush [advised patient we don't think they have thrush, but it's included just to cover all possibilities], Diphenhydramine  - help reduce swelling).  Discussed with patient how to take the medication  Shake the bottle well before each use  Measure out the 5 mL dose using a medication cup or syringe (advised they can get this when picking up the medication from their pharmacy) They can take the full dose at once or split it into two doses if they have trouble will the entire dose at one time  Swish the medication thoroughly around the mouth for about 30 seconds - to make sure it sufficiently coats the mouth  Then they need to hold it in their mouth as long as possible (advised this is an important set to maximize contact with the medication) Then swallow Advised them to avoid eating or drinking for 30 minutes after using so the medication can coat the mouth as long as possible  Counseled the patient in addition to the medication to make sure they stay well hydrated and to maintain oral hygiene as much as possible to prevent additional irritation Advised them to call the office if their mouth sores do not improve or worsen while on the medication  Advised them to let their doctor know and to seek treatment from their PCP if they start to experience fevers,  chills, etc.  Patient also reported struggle with diarrhea, also advised them to let us  know this also becomes a side effect they are having difficulties managing with Imodium .   Thank you for allowing pharmacy to participate in this patient's care.  Barbara Parks, PharmD Pharmacy Resident  05/06/2024 2:54 PM

## 2024-05-10 ENCOUNTER — Inpatient Hospital Stay: Admitting: Nurse Practitioner

## 2024-05-10 ENCOUNTER — Telehealth: Payer: Self-pay | Admitting: *Deleted

## 2024-05-10 ENCOUNTER — Encounter: Payer: Self-pay | Admitting: Adult Health

## 2024-05-10 ENCOUNTER — Inpatient Hospital Stay (HOSPITAL_BASED_OUTPATIENT_CLINIC_OR_DEPARTMENT_OTHER): Admitting: Adult Health

## 2024-05-10 ENCOUNTER — Inpatient Hospital Stay: Attending: Hematology and Oncology

## 2024-05-10 ENCOUNTER — Inpatient Hospital Stay

## 2024-05-10 ENCOUNTER — Other Ambulatory Visit: Payer: Self-pay

## 2024-05-10 VITALS — BP 132/65 | HR 96 | Temp 97.3°F | Resp 17 | Wt 151.5 lb

## 2024-05-10 DIAGNOSIS — T451X5A Adverse effect of antineoplastic and immunosuppressive drugs, initial encounter: Secondary | ICD-10-CM | POA: Insufficient documentation

## 2024-05-10 DIAGNOSIS — G8929 Other chronic pain: Secondary | ICD-10-CM | POA: Insufficient documentation

## 2024-05-10 DIAGNOSIS — R630 Anorexia: Secondary | ICD-10-CM | POA: Diagnosis not present

## 2024-05-10 DIAGNOSIS — R5383 Other fatigue: Secondary | ICD-10-CM | POA: Diagnosis not present

## 2024-05-10 DIAGNOSIS — L2989 Other pruritus: Secondary | ICD-10-CM | POA: Insufficient documentation

## 2024-05-10 DIAGNOSIS — C50411 Malignant neoplasm of upper-outer quadrant of right female breast: Secondary | ICD-10-CM | POA: Diagnosis not present

## 2024-05-10 DIAGNOSIS — Z170 Estrogen receptor positive status [ER+]: Secondary | ICD-10-CM

## 2024-05-10 DIAGNOSIS — Z7901 Long term (current) use of anticoagulants: Secondary | ICD-10-CM | POA: Insufficient documentation

## 2024-05-10 DIAGNOSIS — R0602 Shortness of breath: Secondary | ICD-10-CM | POA: Diagnosis not present

## 2024-05-10 DIAGNOSIS — I8290 Acute embolism and thrombosis of unspecified vein: Secondary | ICD-10-CM | POA: Diagnosis not present

## 2024-05-10 DIAGNOSIS — Z5111 Encounter for antineoplastic chemotherapy: Secondary | ICD-10-CM | POA: Insufficient documentation

## 2024-05-10 DIAGNOSIS — R197 Diarrhea, unspecified: Secondary | ICD-10-CM | POA: Diagnosis not present

## 2024-05-10 DIAGNOSIS — M25512 Pain in left shoulder: Secondary | ICD-10-CM | POA: Diagnosis not present

## 2024-05-10 DIAGNOSIS — C7800 Secondary malignant neoplasm of unspecified lung: Secondary | ICD-10-CM | POA: Diagnosis not present

## 2024-05-10 DIAGNOSIS — C50911 Malignant neoplasm of unspecified site of right female breast: Secondary | ICD-10-CM

## 2024-05-10 DIAGNOSIS — C7951 Secondary malignant neoplasm of bone: Secondary | ICD-10-CM | POA: Diagnosis not present

## 2024-05-10 DIAGNOSIS — Z79899 Other long term (current) drug therapy: Secondary | ICD-10-CM | POA: Diagnosis not present

## 2024-05-10 DIAGNOSIS — C787 Secondary malignant neoplasm of liver and intrahepatic bile duct: Secondary | ICD-10-CM | POA: Diagnosis not present

## 2024-05-10 DIAGNOSIS — Z95828 Presence of other vascular implants and grafts: Secondary | ICD-10-CM

## 2024-05-10 LAB — CBC WITH DIFFERENTIAL (CANCER CENTER ONLY)
Abs Immature Granulocytes: 0.09 K/uL — ABNORMAL HIGH (ref 0.00–0.07)
Basophils Absolute: 0.1 K/uL (ref 0.0–0.1)
Basophils Relative: 1 %
Eosinophils Absolute: 0.3 K/uL (ref 0.0–0.5)
Eosinophils Relative: 3 %
HCT: 48.3 % — ABNORMAL HIGH (ref 36.0–46.0)
Hemoglobin: 15.3 g/dL — ABNORMAL HIGH (ref 12.0–15.0)
Immature Granulocytes: 1 %
Lymphocytes Relative: 7 %
Lymphs Abs: 0.8 K/uL (ref 0.7–4.0)
MCH: 27.5 pg (ref 26.0–34.0)
MCHC: 31.7 g/dL (ref 30.0–36.0)
MCV: 86.7 fL (ref 80.0–100.0)
Monocytes Absolute: 0.9 K/uL (ref 0.1–1.0)
Monocytes Relative: 8 %
Neutro Abs: 9.6 K/uL — ABNORMAL HIGH (ref 1.7–7.7)
Neutrophils Relative %: 80 %
Platelet Count: 442 K/uL — ABNORMAL HIGH (ref 150–400)
RBC: 5.57 MIL/uL — ABNORMAL HIGH (ref 3.87–5.11)
RDW: 16 % — ABNORMAL HIGH (ref 11.5–15.5)
WBC Count: 11.8 K/uL — ABNORMAL HIGH (ref 4.0–10.5)
nRBC: 0 % (ref 0.0–0.2)

## 2024-05-10 LAB — CMP (CANCER CENTER ONLY)
ALT: 9 U/L (ref 0–44)
AST: 19 U/L (ref 15–41)
Albumin: 4.5 g/dL (ref 3.5–5.0)
Alkaline Phosphatase: 200 U/L — ABNORMAL HIGH (ref 38–126)
Anion gap: 10 (ref 5–15)
BUN: 17 mg/dL (ref 8–23)
CO2: 18 mmol/L — ABNORMAL LOW (ref 22–32)
Calcium: 9.9 mg/dL (ref 8.9–10.3)
Chloride: 110 mmol/L (ref 98–111)
Creatinine: 1.03 mg/dL — ABNORMAL HIGH (ref 0.44–1.00)
GFR, Estimated: 57 mL/min — ABNORMAL LOW (ref >60)
Glucose, Bld: 102 mg/dL — ABNORMAL HIGH (ref 70–99)
Potassium: 4.4 mmol/L (ref 3.5–5.1)
Sodium: 138 mmol/L (ref 135–145)
Total Bilirubin: 0.4 mg/dL (ref 0.0–1.2)
Total Protein: 8.8 g/dL — ABNORMAL HIGH (ref 6.5–8.1)

## 2024-05-10 MED ORDER — FULVESTRANT 250 MG/5ML IM SOSY
500.0000 mg | PREFILLED_SYRINGE | Freq: Once | INTRAMUSCULAR | Status: AC
  Administered 2024-05-10: 500 mg via INTRAMUSCULAR
  Filled 2024-05-10: qty 10

## 2024-05-10 NOTE — Telephone Encounter (Signed)
 Called patient but was unable to LVM to make aware of appointment with Palliative Care for 05/10/2024.

## 2024-05-10 NOTE — Progress Notes (Signed)
 [facility] Cancer Follow up:    Barbara Anthony RAMAN, FNP [address] [place] [address]   DIAGNOSIS:  Cancer Staging  Carcinoma of right breast metastatic to lung [facility]) Staging form: Breast, AJCC 7th Edition - Clinical: Stage IV (TX, NX, M1) - Signed by Barbara Sharma POUR, MD on 09/09/2013 Laterality: Right Biopsy of metastatic site performed: No - Pathologic: No stage assigned - Unsigned Laterality: Right  Malignant neoplasm of upper-outer quadrant of right breast in female, estrogen receptor positive (HCC) Staging form: Breast, AJCC 7th Edition - Clinical: Stage IV (TX, NX, M1) - Signed by Barbara Sandria BROCKS, MD on 08/13/2014    SUMMARY OF ONCOLOGIC HISTORY: Oncology History  Carcinoma of right breast metastatic to lung (HCC)  10/10/2011 Initial Diagnosis   Carcinoma of right breast metastatic to lung (HCC)   05/02/2022 - 06/06/2023 Chemotherapy   Patient is on Treatment Plan : BREAST METASTATIC Fam-Trastuzumab Deruxtecan-nxki  (Enhertu ) (5.4) q21d     Malignant neoplasm of upper-outer quadrant of right breast in female, estrogen receptor positive (HCC)  03/22/2004 Initial Diagnosis   right mastectomy and sentinel lymph node sampling 03/22/2004 for a right upper-outer quadrant pT2 pN1, stage IIB invasive ductal carcinoma with lobular features, grade 2, estrogen receptor and progesterone receptor positive, HER-2 negative, with an MIB-1 of 9%   05/2004 - 09/18/2004 Chemotherapy   adjuvant chemotherapy with dose dense doxorubicin and cyclophosphamide x4 cycles (first cycle delayed one week) followed by dose dense paclitaxel x4 was completed 09/18/2004   09/2004 - 2009 Anti-estrogen oral therapy   tamoxifen started March 2006, discontinued 2009   08/2011 Relapse/Recurrence   Metastatic disease:large pericardial effusion, large right pleural effusion and possible right middle lobe bronchial obstruction  status post pericardial window placement, fiberoptic  bronchoscopy and right Pleurx placement 09/11/2011, with biopsy of the bronchus intermedius and pericardium positive for metastatic breast cancer, ER 91%, PR 100% p with an MIB-1 of 35%, and no HER-2 amplification, ratio being 1.37   10/10/2011 - 07/2019 Anti-estrogen oral therapy   BOLERO-4 trial 10/10/2011, receiving letrozole  and everolimus  (stopped for progression)   08/16/2019 Miscellaneous   foundation 1 requested on liver biopsy from 08/16/2019 shows a stable microsatellite status with 1 mutation/MB, amplification of C11orf30, FGF10 and RICTOR, with mutations in ESR1 and PTEN   08/16/2019 Miscellaneous   Liver biopsy: Metastatic carcinoma to the liver consistent with breast primary, ER 100%, PR 100%, Ki-67 5%, HER2 2+ by IHC, FISH HER2 negative   08/24/2019 -  Anti-estrogen oral therapy   fulvestrant  started 08/24/2019, repeated every 4 weeks Palbociclib  125 mg daily 21 days on 7 days off started 08/25/2019 Switched to ribociclib  s starting 08/22/2020   02/28/2021 -  Radiation Therapy   palliative radiation (Dr. Dannielle in [place]) 02/28/2021 to right hip area   10/27/2021 -  Anti-estrogen oral therapy   Abemaciclib  with fulvestrant    01/14/2022 Miscellaneous   Guardant360: ESR 1 mutation (no other mutations) MSI high was not detected, PTEN, T p53, EGFR amplification, RB1 mutations detected without any approved treatment options   Malignant neoplasm metastatic to liver (HCC)  08/09/2019 Initial Diagnosis   Malignant neoplasm metastatic to liver (HCC)     CURRENT THERAPY: Faslodex , xgeva  (due next month), and Capivasertib  (Truqap )  INTERVAL HISTORY:  Discussed the use of AI scribe software for clinical note transcription with the patient, who gave verbal consent to proceed.  History of Present Illness Barbara Parks is a 73 year old female with metastatic breast  cancer who presents for follow-up and evaluation.  Her back pain has improved since the last visit, but she continues to  experience significant pain in the middle of her back radiating across her ribcage. She also has shoulder pain that limits arm movement and impacts her daily activities.  She is on Truqap , which causes diarrhea managed with Imodium , and recently had a mouth ulcer that has improved with medication. Her Truqap  regimen involves a four-day on and three-day off cycle, and she is currently in the off period. (On period is Tuesday, Wednesday, Thursday, and Friday).    She receives Xgeva  injections, with the next dose due next month, and tolerates Faslodex  injections every month. She has used Percocet sparingly for back pain but experienced severe shortness of breath after she started it, and has decided to discontinue its use.  She has appointment with palliative care today.     Patient Active Problem List   Diagnosis Date Noted   Chest pain, unspecified 06/25/2023   Thrombus 06/24/2023   Port-A-Cath in place 06/13/2022   Malignant neoplasm metastatic to liver (HCC) 08/09/2019   Malignant neoplasm metastatic to bone (HCC) 12/23/2018   Goals of care, counseling/discussion 12/23/2018   Hyperglycemia 10/02/2017   Hepatic steatosis 01/23/2017   OA (osteoarthritis) of hip 06/07/2015   Elevated cholesterol with high triglycerides 03/22/2015   Malignant neoplasm of upper-outer quadrant of right breast in female, estrogen receptor positive (HCC) 08/13/2014   Thoracic aorta atherosclerosis 08/13/2014   Proteinuria 06/16/2014   Hypercalcemia 06/16/2014   Fatigue 04/21/2014   ADHD (attention deficit hyperactivity disorder)    Osteopenia due to cancer therapy 09/09/2013   Carcinoma of right breast metastatic to lung (HCC) 10/10/2011   Malignant pericardial effusion (HCC) 09/10/2011   Pleural effusion 09/10/2011   Pneumonia 08/26/2011   Asthma in adult without complication 08/20/2010   Osteoarthritis of left hip 08/17/2010    is allergic to aspirin, latex, and nsaids.  MEDICAL HISTORY: Past  Medical History:  Diagnosis Date   Arthritis    left hip   Asthma    breast ca 2005   breast/chemo R mastectomy   Breast cancer (HCC)    Dermatitis    Diabetes mellitus without complication (HCC)    GERD (gastroesophageal reflux disease)    Hypercholesteremia    Metastasis to lung (HCC) dx'd 08/2011   Osteopenia due to cancer therapy 09/09/2013   Palpitations    Personal history of chemotherapy    Shortness of breath     SURGICAL HISTORY: Past Surgical History:  Procedure Laterality Date   BREAST SURGERY  2005   right   CATARACT EXTRACTION W/PHACO Left 01/18/2014   Procedure: CATARACT EXTRACTION PHACO AND INTRAOCULAR LENS PLACEMENT (IOC);  Surgeon: Oneil T. Roz, MD;  Location: [facility];  Service: Ophthalmology;  Laterality: Left;  CDE 15.79   CATARACT EXTRACTION W/PHACO Right 02/08/2014   Procedure: CATARACT EXTRACTION PHACO AND INTRAOCULAR LENS PLACEMENT (IOC);  Surgeon: Oneil T. Roz, MD;  Location: [facility];  Service: Ophthalmology;  Laterality: Right;  CDE 4.41   CHEST TUBE INSERTION  09/11/2011   Procedure: INSERTION PLEURAL DRAINAGE CATHETER;  Surgeon: Dorise MARLA Fellers, MD;  Location: [facility];  Service: Thoracic;  Laterality: Right;   IR IMAGING GUIDED PORT INSERTION  04/23/2022   MASTECTOMY Right    PERICARDIAL WINDOW  09/11/2011   Procedure: PERICARDIAL WINDOW;  Surgeon: Dorise MARLA Fellers, MD;  Location: [facility];  Service: Thoracic;  Laterality: N/A;   REMOVAL OF PLEURAL DRAINAGE CATHETER  12/19/2011   Procedure: REMOVAL OF PLEURAL DRAINAGE CATHETER;  Surgeon: Dorise MARLA Fellers, MD;  Location: [facility];  Service: Thoracic;  Laterality: Right;  TO BE DONE IN MINOR ROOM, SHORT STAY   SPINE SURGERY  1996   TOTAL HIP ARTHROPLASTY Left 06/07/2015   Procedure: LEFT TOTAL HIP ARTHROPLASTY ANTERIOR APPROACH;  Surgeon: Dempsey Moan, MD;  Location: [facility];  Service: Orthopedics;  Laterality: Left;   VIDEO BRONCHOSCOPY  09/11/2011   Procedure: VIDEO BRONCHOSCOPY;  Surgeon: Dorise MARLA Fellers, MD;   Location: [facility];  Service: Thoracic;  Laterality: N/A;    SOCIAL HISTORY: Social History   Socioeconomic History   Marital status: Single    Spouse name: Not on file   Number of children: Not on file   Years of education: Not on file   Highest education level: Not on file  Occupational History   Not on file  Tobacco Use   Smoking status: Former    Current packs/day: 0.00    Average packs/day: 1.5 packs/day for 30.0 years (45.0 ttl pk-yrs)    Types: Cigarettes    Start date: 09/09/1977    Quit date: 09/10/2007    Years since quitting: 16.6   Smokeless tobacco: Never  Substance and Sexual Activity   Alcohol use: No   Drug use: No   Sexual activity: Not Currently  Other Topics Concern   Not on file  Social History Narrative   Not on file   Social Drivers of Health   Financial Resource Strain: Not on file  Food Insecurity: Not on file  Transportation Needs: Not on file  Physical Activity: Not on file  Stress: Not on file  Social Connections: Not on file  Intimate Partner Violence: Not on file    FAMILY HISTORY: Family History  Problem Relation Age of Onset   Cancer Mother        breast   Heart disease Father    Diabetes Other    Anesthesia problems Neg Hx     Review of Systems  Constitutional:  Positive for fatigue. Negative for appetite change, chills, fever and unexpected weight change.  HENT:   Positive for mouth sores (improved). Negative for hearing loss, lump/mass and trouble swallowing.   Eyes:  Negative for eye problems and icterus.  Respiratory:  Negative for chest tightness, cough and shortness of breath.   Cardiovascular:  Negative for chest pain, leg swelling and palpitations.  Gastrointestinal:  Positive for diarrhea. Negative for abdominal distention, abdominal pain, constipation, nausea and vomiting.  Endocrine: Negative for hot flashes.  Genitourinary:  Negative for difficulty urinating.   Musculoskeletal:  Negative for arthralgias.  Skin:   Negative for itching and rash.  Neurological:  Negative for dizziness, extremity weakness, headaches and numbness.  Hematological:  Negative for adenopathy. Does not bruise/bleed easily.  Psychiatric/Behavioral:  Negative for depression. The patient is not nervous/anxious.       PHYSICAL EXAMINATION   Onc Performance Status - 05/10/24 0948       ECOG Perf Status   ECOG Perf Status Restricted in physically strenuous activity but ambulatory and able to carry out work of a light or sedentary nature, e.g., light house work, office work      KPS SCALE   KPS % SCORE Able to carry on normal activity, minor s/s of disease          Vitals:   05/10/24 0934  BP: 132/65  Pulse: 96  Resp: 17  Temp: (!) 97.3 F (36.3 C)  SpO2: 100%    Physical Exam Constitutional:      General: She is not in acute distress.    Appearance: Normal appearance. She is not toxic-appearing.  HENT:     Head: Normocephalic and atraumatic.     Mouth/Throat:     Mouth: Mucous membranes are moist.     Pharynx: Oropharynx is clear. No oropharyngeal exudate or posterior oropharyngeal erythema.  Eyes:     General: No scleral icterus. Cardiovascular:     Rate and Rhythm: Normal rate and regular rhythm.     Pulses: Normal pulses.     Heart sounds: Normal heart sounds.  Pulmonary:     Effort: Pulmonary effort is normal.     Breath sounds: Normal breath sounds.  Abdominal:     General: Abdomen is flat. Bowel sounds are normal. There is no distension.     Palpations: Abdomen is soft.     Tenderness: There is no abdominal tenderness.  Musculoskeletal:        General: No swelling.     Cervical back: Neck supple.  Lymphadenopathy:     Cervical: No cervical adenopathy.  Skin:    General: Skin is warm and dry.     Findings: No rash.  Neurological:     General: No focal deficit present.     Mental Status: She is alert.  Psychiatric:        Mood and Affect: Mood normal.        Behavior: Behavior normal.      LABORATORY DATA:  CBC    Component Value Date/Time   WBC 16.5 (H) 04/26/2024 0915   WBC 10.0 02/26/2022 1123   RBC 5.31 (H) 04/26/2024 0915   HGB 14.6 04/26/2024 0915   HGB 14.1 07/08/2017 0942   HCT 46.0 04/26/2024 0915   HCT 42.9 07/08/2017 0942   PLT 326 04/26/2024 0915   PLT 283 07/08/2017 0942   MCV 86.6 04/26/2024 0915   MCV 81.1 07/08/2017 0942   MCH 27.5 04/26/2024 0915   MCHC 31.7 04/26/2024 0915   RDW 16.3 (H) 04/26/2024 0915   RDW 14.6 (H) 07/08/2017 0942   LYMPHSABS 0.7 04/26/2024 0915   LYMPHSABS 1.2 07/08/2017 0942   MONOABS 1.6 (H) 04/26/2024 0915   MONOABS 0.7 07/08/2017 0942   EOSABS 0.2 04/26/2024 0915   EOSABS 0.1 07/08/2017 0942   BASOSABS 0.1 04/26/2024 0915   BASOSABS 0.0 07/08/2017 0942    CMP     Component Value Date/Time   NA 138 04/26/2024 0915   NA 139 07/08/2017 0942   K 3.9 04/26/2024 0915   K 3.8 07/08/2017 0942   CL 107 04/26/2024 0915   CL 109 (H) 12/31/2012 0940   CO2 19 (L) 04/26/2024 0915   CO2 18 (L) 07/08/2017 0942   GLUCOSE 120 (H) 04/26/2024 0915   GLUCOSE 156 (H) 07/08/2017 0942   GLUCOSE 127 (H) 12/31/2012 0940   BUN 25 (H) 04/26/2024 0915   BUN 17.7 07/08/2017 0942   CREATININE 1.15 (H) 04/26/2024 0915   CREATININE 1.0 07/08/2017 0942   CALCIUM  9.2 04/26/2024 0915   CALCIUM  10.4 07/08/2017 0942   PROT 8.7 (H) 04/26/2024 0915   PROT 7.7 07/08/2017 0942   ALBUMIN 4.6 04/26/2024 0915   ALBUMIN 3.4 (L) 07/08/2017 0942   AST 25 04/26/2024 0915   AST 54 (H) 07/08/2017 0942   ALT 11 04/26/2024 0915   ALT 39 07/08/2017 0942   ALKPHOS 181 (H) 04/26/2024 0915   ALKPHOS 232 (H) 07/08/2017 9057  BILITOT 0.8 04/26/2024 0915   BILITOT 0.42 07/08/2017 0942   GFRNONAA 50 (L) 04/26/2024 0915   GFRAA 49 (L) 04/05/2020 1504   GFRAA 54 (L) 12/25/2017 0959     ASSESSMENT and THERAPY PLAN:   Malignant neoplasm of upper-outer quadrant of right breast in female, estrogen receptor positive (HCC) Metastatic breast cancer  (pericardial effusion, pleural effusion, right middle lobe lung lesion, ER 91%, PR 90%, Ki-67 35%, HER2 negative diagnosed February 2023)   (Prior history of right mastectomy 2005, adjuvant chemo with Adriamycin Cytoxan and Taxol followed by tamoxifen)   Current treatment:  1. Ribociclib , fulvestrant , Xgeva , ribociclib  discontinued 10/18/2021  2. abemaciclib  started 10/27/2021 3. Enhertu  discontinued for progression after 16 cycles on 06/06/2023 4.  Elacestrant  06/18/2023-03/16/2024   Foundation 1 in 2021: MSI stable, TMB 1, ESR 1 mutation, PTEN, FGF 10, RICTOR amplifications   Guardant360 01/14/2022: No actionable mutations.  ESR 1 mutation. Liver biopsy 02/26/2022: Metastatic carcinoma consistent with breast primary. Caris mutation testing 02/26/2022: HER2 low, ESR 1 mutation, PTEN She has a puppy named Ricky Erle Tzu 7 months old).    08/30/2022: CT chest: Stable moderate right pleural effusion with associated near complete collapse right lower lobe.  Unchanged bone metastases. 11/26/2022: CT CAP: No significant interval change.  Stable right-sided pleural effusion 5 mm lung nodule stable bone mets CA 27-29: 143 (was 695 on 05/23/2022)  03/06/2023: CT chest: Stable, small pleural effusion, stable tiny lung nodules, stable bone metastasis 03/06/2023: Liver MRI: Multiple hepatic metastases overall decreased in size 05/29/2023: MRI liver: Multiple liver lesions slightly increased in size and conspicuity.  Stable vertebral body metastasis benign left adrenal adenoma 06/05/2023: Unchanged moderate right pleural effusion 01/23/2024: CT CAP: Moderate loculated right pleural effusion, stable liver lesions, stable bone metastasis 03/05/2024: MRI liver: Mixed response to hepatic metastases some are smaller, few new lesions, mild progression of bone metastases   CA 27-29: 356 on 03/01/2024 (was 149 on 09/15/2023) Port Blood clot: Currently on anticoagulation with Eliquis  05/03/2024 MRI spine: L2 compression fracture and  metastatic lesions at T11, L3, side of the sacrum and left iliac bone.   Current treatment: Capivasertib  started 04/01/2024  Assessment and Plan Assessment & Plan Metastatic breast cancer with bone metastases Metastatic breast cancer with lesions at T11, L3, left sacrum, and left iliac bone. Currently on Truqap  therapy with side effects including diarrhea (managed with imodium ) and oral mucositis (magic mouthwash). Receiving Xgeva  for bone metastases. - Continue Truqap  therapy with current dosing schedule of four days on, three days off. - Proceed with Faslodex  today - Administer Xgeva  injection next month. - Discuss with radiology the potential for kyphoplasty and with radiation oncology regarding potential radiation therapy for bone metastases. - Adding on labs to today's visit  Compression fracture of L2 vertebra due to bone metastases Acute or subacute compression fracture of L2 vertebra identified on MRI. Potential treatment options include kyphoplasty or radiation therapy. - Consult interventional radiology to review MRI and assess suitability for kyphoplasty. - Discuss potential radiation therapy with radiation oncology.  Chronic back pain due to vertebral metastases and compression fracture Chronic back pain exacerbated by vertebral metastases and compression fracture.  - Follow up with palliative care today to discuss other options for pain management.  Diarrhea secondary to Truqap  therapy Diarrhea occurring three to four times daily, associated with Truqap  therapy. Imodium  provides some relief, but diarrhea remains a significant issue impacting daily activities. - Continue Imodium  as needed for diarrhea management.   RTC in 4 weeks for labs,  f/u, injection with Xgeva  and Faslodex .    All questions were answered. The patient knows to call the clinic with any problems, questions or concerns. We can certainly see the patient much sooner if necessary.  Total encounter time:30  minutes*in face-to-face visit time, chart review, lab review, care coordination, order entry, and documentation of the encounter time.    Morna Kendall, NP 05/10/24 10:40 AM Medical Oncology and Hematology [facility] [address] [place], [place] [address] Tel. [phone]    Fax. [phone]  *Total Encounter Time as defined by the Centers for Medicare and Medicaid Services includes, in addition to the face-to-face time of a patient visit (documented in the note above) non-face-to-face time: obtaining and reviewing outside history, ordering and reviewing medications, tests or procedures, care coordination (communications with other health care professionals or caregivers) and documentation in the medical record.

## 2024-05-10 NOTE — Assessment & Plan Note (Signed)
 Metastatic breast cancer (pericardial effusion, pleural effusion, right middle lobe lung lesion, ER 91%, PR 90%, Ki-67 35%, HER2 negative diagnosed February 2023)   (Prior history of right mastectomy 2005, adjuvant chemo with Adriamycin Cytoxan and Taxol followed by tamoxifen)   Current treatment:  1. Ribociclib , fulvestrant , Xgeva , ribociclib  discontinued 10/18/2021  2. abemaciclib  started 10/27/2021 3. Enhertu  discontinued for progression after 16 cycles on 06/06/2023 4.  Elacestrant  06/18/2023-03/16/2024   Foundation 1 in 2021: MSI stable, TMB 1, ESR 1 mutation, PTEN, FGF 10, RICTOR amplifications   Guardant360 01/14/2022: No actionable mutations.  ESR 1 mutation. Liver biopsy 02/26/2022: Metastatic carcinoma consistent with breast primary. Caris mutation testing 02/26/2022: HER2 low, ESR 1 mutation, PTEN She has a puppy named Ricky Erle Tzu 7 months old).    08/30/2022: CT chest: Stable moderate right pleural effusion with associated near complete collapse right lower lobe.  Unchanged bone metastases. 11/26/2022: CT CAP: No significant interval change.  Stable right-sided pleural effusion 5 mm lung nodule stable bone mets CA 27-29: 143 (was 695 on 05/23/2022)  03/06/2023: CT chest: Stable, small pleural effusion, stable tiny lung nodules, stable bone metastasis 03/06/2023: Liver MRI: Multiple hepatic metastases overall decreased in size 05/29/2023: MRI liver: Multiple liver lesions slightly increased in size and conspicuity.  Stable vertebral body metastasis benign left adrenal adenoma 06/05/2023: Unchanged moderate right pleural effusion 01/23/2024: CT CAP: Moderate loculated right pleural effusion, stable liver lesions, stable bone metastasis 03/05/2024: MRI liver: Mixed response to hepatic metastases some are smaller, few new lesions, mild progression of bone metastases   CA 27-29: 356 on 03/01/2024 (was 149 on 09/15/2023) Port Blood clot: Currently on anticoagulation with Eliquis  05/03/2024 MRI spine: L2  compression fracture and metastatic lesions at T11, L3, side of the sacrum and left iliac bone.   Current treatment: Capivasertib  started 04/01/2024

## 2024-05-10 NOTE — Patient Instructions (Signed)
 Using a Balloon to Treat a Spine Compression Fracture (Balloon Kyphoplasty): What to Expect A balloon kyphoplasty may be done to treat a spine compression fracture. This type of break, or fracture, causes the spine to be squeezed into a wedge shape. During a balloon kyphoplasty, the spine is expanded with a balloon. Then, bone cement is put into the spine to make it stronger. Tell a health care provider about: Any allergies you have. All medicines you take. These include vitamins, herbs, eye drops, and creams. Any problems you or family members have had with anesthesia. Any bleeding problems you have. Any surgeries you've had. Any medical problems you have or have had. Whether you're pregnant or may be pregnant. What are the risks? Your health care provider will talk with you about risks. These may include: Infection. Bleeding. More back pain. Allergic reactions to medicines. Damage to nearby structures, nerves, or organs. Leaking of bone cement into other parts of the body. What happens before? When to stop eating and drinking Eat and drink as you've been told. If you don't, your balloon kyphoplasty may be delayed or canceled. You may be told this: 8 hours before Do not eat meat, fried foods, or fatty foods. Eat only light foods, such as toast or crackers. All liquids are OK except energy drinks and alcohol. 6 hours before Stop eating. Drink only clear liquids, such as water, clear fruit juice, black coffee, plain tea, and sports drinks. Do not drink energy drinks or alcohol. 2 hours before Stop drinking all liquids. You may be allowed to take medicines with small sips of water. Medicines Ask about changing or stopping: Any medicines you take. Any vitamins, herbs, or supplements you take. Do not take aspirin or ibuprofen unless you're told to. Procedure safety For your safety, you may: Need to wash your skin with a soap that kills germs. Get antibiotics. Have your procedure  site marked. Have hair removed at the procedure site. General instructions Do not smoke, vape, or use nicotine or tobacco for at least 4 weeks before the procedure. Ask if you'll be staying overnight in the hospital. If you'll be going home right after the procedure, plan to have a responsible adult: Drive you home from the hospital or clinic. You won't be allowed to drive. Stay with you for the time you're told. What happens during a balloon kyphoplasty?  An IV will be put into a vein in your hand or arm. You may be given: A sedative to help you relax. Anesthesia to keep you from feeling pain. A hollow needle will be put through your skin and into your spine. An X-ray machine will be used to guide the needle to where it needs to go. A small balloon will be put through the needle into your spine. The balloon will be inflated. This will create space and push the broken bone back toward its normal height and shape. The balloon will be taken out. The newly created space in your spine will be filled with bone cement. The cement will harden quickly. When the cement hardens, the needle will be taken out. The puncture site from the needle will be closed with skin glue or tape strips. A bandage may be put over the glue or tape strips. These steps may vary. Ask what you can expect. What happens after? You'll be watched closely until you leave. This includes checking your pain level, blood pressure, heart rate, and breathing rate. You may be given medicine to help with pain. An  ice pack may be put over the needle site. This information is not intended to replace advice given to you by your health care provider. Make sure you discuss any questions you have with your health care provider. Document Revised: 08/04/2023 Document Reviewed: 08/04/2023 Elsevier Patient Education  2025 [employer].

## 2024-05-11 ENCOUNTER — Telehealth: Payer: Self-pay | Admitting: Nurse Practitioner

## 2024-05-11 LAB — CANCER ANTIGEN 27.29: CA 27.29: 442.7 U/mL — ABNORMAL HIGH (ref 0.0–38.6)

## 2024-05-11 NOTE — Telephone Encounter (Signed)
 Rescheduled patients palliative appointment. Called and spoke with the patient, she is aware of new day and time.

## 2024-05-12 ENCOUNTER — Telehealth: Payer: Self-pay | Admitting: Dietician

## 2024-05-12 NOTE — Telephone Encounter (Signed)
 Patient screened on MST. First attempt to reach. Provided my cell# on voice mail to return call to set up a nutrition consult.  Micheline Craven, RDN, LDN Registered Dietitian, [facility] Part Time Remote (Usual office hours: Tuesday-Thursday) Cell: [phone]

## 2024-05-13 ENCOUNTER — Telehealth: Payer: Self-pay | Admitting: *Deleted

## 2024-05-13 NOTE — Telephone Encounter (Signed)
 Barbara Parks has been referred to IR for Kyphoplasty. I have tried to call her twice today and left messages.adin

## 2024-05-15 ENCOUNTER — Encounter: Payer: Self-pay | Admitting: Hematology and Oncology

## 2024-05-15 NOTE — Progress Notes (Signed)
 Erroneous

## 2024-05-17 ENCOUNTER — Inpatient Hospital Stay (HOSPITAL_BASED_OUTPATIENT_CLINIC_OR_DEPARTMENT_OTHER): Admitting: Hematology and Oncology

## 2024-05-17 ENCOUNTER — Telehealth: Payer: Self-pay

## 2024-05-17 VITALS — BP 90/64 | HR 108 | Temp 98.3°F | Resp 18 | Ht 64.5 in | Wt 147.7 lb

## 2024-05-17 DIAGNOSIS — C7800 Secondary malignant neoplasm of unspecified lung: Secondary | ICD-10-CM | POA: Diagnosis not present

## 2024-05-17 DIAGNOSIS — Z5111 Encounter for antineoplastic chemotherapy: Secondary | ICD-10-CM | POA: Diagnosis not present

## 2024-05-17 DIAGNOSIS — C50411 Malignant neoplasm of upper-outer quadrant of right female breast: Secondary | ICD-10-CM | POA: Diagnosis not present

## 2024-05-17 DIAGNOSIS — Z170 Estrogen receptor positive status [ER+]: Secondary | ICD-10-CM | POA: Diagnosis not present

## 2024-05-17 DIAGNOSIS — C50911 Malignant neoplasm of unspecified site of right female breast: Secondary | ICD-10-CM

## 2024-05-17 DIAGNOSIS — C787 Secondary malignant neoplasm of liver and intrahepatic bile duct: Secondary | ICD-10-CM | POA: Diagnosis not present

## 2024-05-17 DIAGNOSIS — C7951 Secondary malignant neoplasm of bone: Secondary | ICD-10-CM | POA: Diagnosis not present

## 2024-05-17 MED ORDER — METHYLPREDNISOLONE 4 MG PO TBPK: ORAL_TABLET | ORAL | 0 refills | Status: DC

## 2024-05-17 NOTE — Assessment & Plan Note (Signed)
 Metastatic breast cancer (pericardial effusion, pleural effusion, right middle lobe lung lesion, ER 91%, PR 90%, Ki-67 35%, HER2 negative diagnosed February 2023)   (Prior history of right mastectomy 2005, adjuvant chemo with Adriamycin Cytoxan and Taxol followed by tamoxifen)   Current treatment:  1. Ribociclib , fulvestrant , Xgeva , ribociclib  discontinued 10/18/2021  2. abemaciclib  started 10/27/2021 3. Enhertu  discontinued for progression after 16 cycles on 06/06/2023 4.  Elacestrant  06/18/2023-03/16/2024   Foundation 1 in 2021: MSI stable, TMB 1, ESR 1 mutation, PTEN, FGF 10, RICTOR amplifications   Guardant360 01/14/2022: No actionable mutations.  ESR 1 mutation. Liver biopsy 02/26/2022: Metastatic carcinoma consistent with breast primary. Caris mutation testing 02/26/2022: HER2 low, ESR 1 mutation, PTEN She has a puppy named Ricky Erle Tzu 7 months old).    01/23/2024: CT CAP: Moderate loculated right pleural effusion, stable liver lesions, stable bone metastasis 03/05/2024: MRI liver: Mixed response to hepatic metastases some are smaller, few new lesions, mild progression of bone metastases    CA 27-29: 356 on 03/01/2024 (was 149 on 09/15/2023) Port Blood clot: Currently on anticoagulation with Eliquis    Current treatment: Capivasertib  started 04/13/2024 Capivasertib  toxicities: Intermittent diarrhea Fatigue   Severe mid to lower back pain: MRI lumbar spine 04/29/2024: Metastatic lesions of T11, L3, left side of the sacrum and left iliac bone, 1 cm right lobe liver lesion likely metastasis compression fracture L2 currently on Percocets.   Left shoulder pain: Recommended radiation.  Patient is undecided

## 2024-05-17 NOTE — Telephone Encounter (Signed)
 S/w patient regarding message about new onset rash. Patient reported rash started on Saturday and has spread across trunk, arms, and inner thighs. Describes it as feeling miserable and benadryl  only provides minimal relief. Dr. Gudena made aware and would like patient to be brought in to clinic for further evaluation today. Patient agreeable to coming in to be seen. Scheduled patient for an appointment at 1:30 PM.  Confirmed appointment time with patient.

## 2024-05-17 NOTE — Progress Notes (Signed)
 Patient Care Team: Dyane Anthony RAMAN, FNP as PCP - General (Family Medicine) Alvan Ronal BRAVO, MD (Inactive) as PCP - Cardiology (Cardiology) Jeffrie Oneil BROCKS, MD (Cardiology) Lucas Dorise POUR, MD (Cardiothoracic Surgery) Lucila Norleen LABOR, RPH-CPP (Pharmacist) Dannielle Alm BRAVO, MD as Referring Physician (Radiation Oncology) Odean Potts, MD as Consulting Physician (Hematology and Oncology)  DIAGNOSIS:  Encounter Diagnosis  Name Primary?   Carcinoma of right breast metastatic to lung (HCC) Yes    SUMMARY OF ONCOLOGIC HISTORY: Oncology History  Carcinoma of right breast metastatic to lung (HCC)  10/10/2011 Initial Diagnosis   Carcinoma of right breast metastatic to lung (HCC)   05/02/2022 - 06/06/2023 Chemotherapy   Patient is on Treatment Plan : BREAST METASTATIC Fam-Trastuzumab Deruxtecan-nxki  (Enhertu ) (5.4) q21d     Malignant neoplasm of upper-outer quadrant of right breast in female, estrogen receptor positive (HCC)  03/22/2004 Initial Diagnosis   right mastectomy and sentinel lymph node sampling 03/22/2004 for a right upper-outer quadrant pT2 pN1, stage IIB invasive ductal carcinoma with lobular features, grade 2, estrogen receptor and progesterone receptor positive, HER-2 negative, with an MIB-1 of 9%   05/2004 - 09/18/2004 Chemotherapy   adjuvant chemotherapy with dose dense doxorubicin and cyclophosphamide x4 cycles (first cycle delayed one week) followed by dose dense paclitaxel x4 was completed 09/18/2004   09/2004 - 2009 Anti-estrogen oral therapy   tamoxifen started March 2006, discontinued 2009   08/2011 Relapse/Recurrence   Metastatic disease:large pericardial effusion, large right pleural effusion and possible right middle lobe bronchial obstruction  status post pericardial window placement, fiberoptic bronchoscopy and right Pleurx placement 09/11/2011, with biopsy of the bronchus intermedius and pericardium positive for metastatic breast cancer, ER 91%, PR 100% p with an MIB-1  of 35%, and no HER-2 amplification, ratio being 1.37   10/10/2011 - 07/2019 Anti-estrogen oral therapy   BOLERO-4 trial 10/10/2011, receiving letrozole  and everolimus  (stopped for progression)   08/16/2019 Miscellaneous   foundation 1 requested on liver biopsy from 08/16/2019 shows a stable microsatellite status with 1 mutation/MB, amplification of C11orf30, FGF10 and RICTOR, with mutations in ESR1 and PTEN   08/16/2019 Miscellaneous   Liver biopsy: Metastatic carcinoma to the liver consistent with breast primary, ER 100%, PR 100%, Ki-67 5%, HER2 2+ by IHC, FISH HER2 negative   08/24/2019 -  Anti-estrogen oral therapy   fulvestrant  started 08/24/2019, repeated every 4 weeks Palbociclib  125 mg daily 21 days on 7 days off started 08/25/2019 Switched to ribociclib  s starting 08/22/2020   02/28/2021 -  Radiation Therapy   palliative radiation (Dr. Dannielle in [place]) 02/28/2021 to right hip area   10/27/2021 -  Anti-estrogen oral therapy   Abemaciclib  with fulvestrant    01/14/2022 Miscellaneous   Guardant360: ESR 1 mutation (no other mutations) MSI high was not detected, PTEN, T p53, EGFR amplification, RB1 mutations detected without any approved treatment options   Malignant neoplasm metastatic to liver (HCC)  08/09/2019 Initial Diagnosis   Malignant neoplasm metastatic to liver (HCC)     CHIEF COMPLIANT: Maculopapular rash on the chest back and legs  HISTORY OF PRESENT ILLNESS:  History of Present Illness Barbara Parks is a 73 year old female who presents with a rash and shortness of breath.  The rash began on Saturday morning and is located on her sides, thighs, and legs, with a distinct line below which the rash is present. It is severe and itchy. Benadryl  provides temporary relief for about an hour, but the rash does not resolve with medications.  She experiences  new and significant shortness of breath since the weekend, more severe than her usual baseline. She has difficulty with  activities such as walking from the living room to the kitchen, requiring rest due to breathlessness.  She experiences poor sleep due to the itching from the rash and has a poor appetite, eating very little due to a lack of hunger. She denies any recent gardening or exposure to potential allergens like poison ivy. She has a puppy that she is unable to walk or play with due to her current symptoms.     ALLERGIES:  is allergic to aspirin, latex, and nsaids.  MEDICATIONS:  Current Outpatient Medications  Medication Sig Dispense Refill   capivasertib  (TRUQAP ) 200 MG pack Take 2 tablets (400 mg total) by mouth 2 (two) times daily. Take for 4 days, then hold for 3 days. Repeat every 7 days.     carvedilol  (COREG ) 3.125 MG tablet Take 1 tablet (3.125 mg total) by mouth 2 (two) times daily with a meal. 180 tablet 3   cetirizine (ZYRTEC) 10 MG tablet Take 10 mg by mouth daily.     cholecalciferol (VITAMIN D3) 25 MCG (1000 UT) tablet Take 2 tablets (2,000 Units total) by mouth daily. 180 tablet 4   cyclobenzaprine  (FLEXERIL ) 10 MG tablet Take 1 tablet (10 mg total) by mouth 3 (three) times daily as needed for muscle spasms. 60 tablet 0   gemfibrozil  (LOPID ) 600 MG tablet TAKE 1 TABLET (600 MG TOTAL) BY MOUTH 2 (TWO) TIMES DAILY BEFORE A MEAL. 180 tablet 2   loperamide  (IMODIUM ) 2 MG capsule Take 2 mg by mouth 4 (four) times daily as needed for diarrhea or loose stools. Reported on 11/30/2015     magic mouthwash (nystatin, lidocaine , diphenhydrAMINE , alum & mag hydroxide) suspension Take 5 mLs by mouth 4 (four) times daily as needed for mouth pain. 240 mL 2   methylPREDNISolone (MEDROL DOSEPAK) 4 MG TBPK tablet Use as directed 21 tablet 0   omeprazole  (PRILOSEC OTC) 20 MG tablet Take 20 mg by mouth daily as needed (acid reflux).     oxyCODONE -acetaminophen  (PERCOCET/ROXICET) 5-325 MG tablet Take 1 tablet by mouth every 8 (eight) hours as needed for severe pain (pain score 7-10). 60 tablet 0    prochlorperazine  (COMPAZINE ) 10 MG tablet Take 10 mg by mouth every 6 (six) hours as needed for nausea or vomiting.     No current facility-administered medications for this visit.    PHYSICAL EXAMINATION: ECOG PERFORMANCE STATUS: 1 - Symptomatic but completely ambulatory  Vitals:   05/17/24 1324  BP: 90/64  Pulse: (!) 108  Resp: 18  Temp: 98.3 F (36.8 C)  SpO2: 98%   Filed Weights   05/17/24 1324  Weight: 147 lb 11.2 oz (67 kg)    Physical Exam SKIN: Rash present below a distinct line on the skin, on sides and thighs, with a different appearance on the back compared to the front.      LABORATORY DATA:  I have reviewed the data as listed    Latest Ref Rng & Units 05/10/2024   11:28 AM 04/26/2024    9:15 AM 04/16/2024    9:49 AM  CMP  Glucose 70 - 99 mg/dL 897  879  856   BUN 8 - 23 mg/dL 17  25  15    Creatinine 0.44 - 1.00 mg/dL 8.96  8.84  9.05   Sodium 135 - 145 mmol/L 138  138  139   Potassium 3.5 - 5.1 mmol/L 4.4  3.9  4.0   Chloride 98 - 111 mmol/L 110  107  112   CO2 22 - 32 mmol/L 18  19  20    Calcium  8.9 - 10.3 mg/dL 9.9  9.2  9.0   Total Protein 6.5 - 8.1 g/dL 8.8  8.7  7.8   Total Bilirubin 0.0 - 1.2 mg/dL 0.4  0.8  0.3   Alkaline Phos 38 - 126 U/L 200  181  144   AST 15 - 41 U/L 19  25  15    ALT 0 - 44 U/L 9  11  7      Lab Results  Component Value Date   WBC 11.8 (H) 05/10/2024   HGB 15.3 (H) 05/10/2024   HCT 48.3 (H) 05/10/2024   MCV 86.7 05/10/2024   PLT 442 (H) 05/10/2024   NEUTROABS 9.6 (H) 05/10/2024    ASSESSMENT & PLAN:  Carcinoma of right breast metastatic to lung [facility]) Metastatic breast cancer (pericardial effusion, pleural effusion, right middle lobe lung lesion, ER 91%, PR 90%, Ki-67 35%, HER2 negative diagnosed February 2023)   (Prior history of right mastectomy 2005, adjuvant chemo with Adriamycin Cytoxan and Taxol followed by tamoxifen)   Current treatment:  1. Ribociclib , fulvestrant , Xgeva , ribociclib  discontinued  10/18/2021  2. abemaciclib  started 10/27/2021 3. Enhertu  discontinued for progression after 16 cycles on 06/06/2023 4.  Elacestrant  06/18/2023-03/16/2024   Foundation 1 in 2021: MSI stable, TMB 1, ESR 1 mutation, PTEN, FGF 10, RICTOR amplifications   Guardant360 01/14/2022: No actionable mutations.  ESR 1 mutation. Liver biopsy 02/26/2022: Metastatic carcinoma consistent with breast primary. Caris mutation testing 02/26/2022: HER2 low, ESR 1 mutation, PTEN She has a puppy named Ricky Erle Tzu 7 months old).    01/23/2024: CT CAP: Moderate loculated right pleural effusion, stable liver lesions, stable bone metastasis 03/05/2024: MRI liver: Mixed response to hepatic metastases some are smaller, few new lesions, mild progression of bone metastases    CA 27-29: 356 on 03/01/2024 (was 149 on 09/15/2023) Port Blood clot: Currently on anticoagulation with Eliquis    Current treatment: Capivasertib  started 04/13/2024 Capivasertib  toxicities: Intermittent diarrhea Fatigue Maculopapular rash abdomen chest and legs: Send a prescription for Medrol Dosepak and instructed her to hold through.      Severe mid to lower back pain: MRI lumbar spine 04/29/2024: Metastatic lesions of T11, L3, left side of the sacrum and left iliac bone, 1 cm right lobe liver lesion likely metastasis compression fracture L2 currently on Percocets.   Left shoulder pain: Recommended radiation.  Patient is undecided  I will call her in 1 week to see if the rash is improved.  At that time she can resume to Again.  If the rash relapses then we may have to permanently discontinue Truqap . ------------------------------------- Assessment and Plan Assessment & Plan Malignant neoplasm of right breast with metastases to bone, liver, and lung Well-managed disease with decreased CA 27-29 marker from 500 to 400. Capivasertib  on hold due to rash. - Monitor CA 27-29 marker. - Reassess Capivasertib  treatment after resolution of rash.  Drug-induced  rash and pruritus Rash and pruritus likely secondary to Capivasertib . Rash extensive with significant pruritus. Benadryl  provides temporary relief. Shortness of breath noted.  - Hold Capivasertib  (Truqap ). - Prescribe Medrol Dosepak for 5 days. - Reassess in one week to determine if Capivasertib  can be resumed or needs to be permanently discontinued.  Shortness of breath New significant dyspnea. Severe enough to require rest after minimal exertion.  Decreased appetite Recent decrease in appetite, possibly related  to condition and treatment side effects. Anticipated improvement with steroid treatment. - Encourage nutritional intake to maintain energy levels. - Monitor appetite and nutritional status.      No orders of the defined types were placed in this encounter.  The patient has a good understanding of the overall plan. she agrees with it. she will call with any problems that may develop before the next visit here.  I personally spent a total of 30 minutes in the care of the patient today including preparing to see the patient, getting/reviewing separately obtained history, performing a medically appropriate exam/evaluation, counseling and educating, placing orders, referring and communicating with other health care professionals, documenting clinical information in the EHR, independently interpreting results, communicating results, and coordinating care.   Viinay K Emalia Witkop, MD 05/17/24

## 2024-05-20 ENCOUNTER — Telehealth: Payer: Self-pay | Admitting: Dietician

## 2024-05-20 NOTE — Telephone Encounter (Signed)
Patient screened on MST. Second attempt to reach. Provided my cell# on voice mail to return call to set up a nutrition consult.  April Manson, RDN, LDN Registered Dietitian, [facility] Part Time Remote (Usual office hours: Tuesday-Thursday) Cell: [phone]

## 2024-05-24 ENCOUNTER — Other Ambulatory Visit: Payer: Self-pay

## 2024-05-24 ENCOUNTER — Inpatient Hospital Stay (HOSPITAL_BASED_OUTPATIENT_CLINIC_OR_DEPARTMENT_OTHER): Admitting: Hematology and Oncology

## 2024-05-24 DIAGNOSIS — J90 Pleural effusion, not elsewhere classified: Secondary | ICD-10-CM

## 2024-05-24 DIAGNOSIS — I3139 Other pericardial effusion (noninflammatory): Secondary | ICD-10-CM

## 2024-05-24 DIAGNOSIS — R197 Diarrhea, unspecified: Secondary | ICD-10-CM | POA: Diagnosis not present

## 2024-05-24 DIAGNOSIS — C50911 Malignant neoplasm of unspecified site of right female breast: Secondary | ICD-10-CM

## 2024-05-24 DIAGNOSIS — C7801 Secondary malignant neoplasm of right lung: Secondary | ICD-10-CM

## 2024-05-24 DIAGNOSIS — M5459 Other low back pain: Secondary | ICD-10-CM

## 2024-05-24 DIAGNOSIS — R21 Rash and other nonspecific skin eruption: Secondary | ICD-10-CM

## 2024-05-24 DIAGNOSIS — R5383 Other fatigue: Secondary | ICD-10-CM

## 2024-05-24 DIAGNOSIS — M25512 Pain in left shoulder: Secondary | ICD-10-CM

## 2024-05-24 NOTE — Assessment & Plan Note (Signed)
 Metastatic breast cancer (pericardial effusion, pleural effusion, right middle lobe lung lesion, ER 91%, PR 90%, Ki-67 35%, HER2 negative diagnosed February 2023)   (Prior history of right mastectomy 2005, adjuvant chemo with Adriamycin Cytoxan and Taxol followed by tamoxifen)   Current treatment:  1. Ribociclib , fulvestrant , Xgeva , ribociclib  discontinued 10/18/2021  2. abemaciclib  started 10/27/2021 3. Enhertu  discontinued for progression after 16 cycles on 06/06/2023 4.  Elacestrant  06/18/2023-03/16/2024   Foundation 1 in 2021: MSI stable, TMB 1, ESR 1 mutation, PTEN, FGF 10, RICTOR amplifications   Guardant360 01/14/2022: No actionable mutations.  ESR 1 mutation. Liver biopsy 02/26/2022: Metastatic carcinoma consistent with breast primary. Caris mutation testing 02/26/2022: HER2 low, ESR 1 mutation, PTEN She has a puppy named Ricky Erle Tzu 7 months old).    01/23/2024: CT CAP: Moderate loculated right pleural effusion, stable liver lesions, stable bone metastasis 03/05/2024: MRI liver: Mixed response to hepatic metastases some are smaller, few new lesions, mild progression of bone metastases    CA 27-29: 356 on 03/01/2024 (was 149 on 09/15/2023) Port Blood clot: Currently on anticoagulation with Eliquis    Current treatment: Capivasertib  started 04/13/2024 Capivasertib  toxicities: Intermittent diarrhea Fatigue Maculopapular rash abdomen chest and legs: Send a prescription for Medrol Dosepak and instructed her to hold through.       Severe mid to lower back pain: MRI lumbar spine 04/29/2024: Metastatic lesions of T11, L3, left side of the sacrum and left iliac bone, 1 cm right lobe liver lesion likely metastasis compression fracture L2 currently on Percocets.   Left shoulder pain: Recommended radiation.  Patient is undecided   I will call her in 1 week to see if the rash is improved.  At that time she can resume to Again.  If the rash relapses then we may have to permanently discontinue Truqap .

## 2024-05-24 NOTE — Progress Notes (Signed)
 HEMATOLOGY-ONCOLOGY TELEPHONE VISIT PROGRESS NOTE  I connected with our patient on 05/24/24 at  1:45 PM EDT by telephone and verified that I am speaking with the correct person using two identifiers.  I discussed the limitations, risks, security and privacy concerns of performing an evaluation and management service by telephone and the availability of in person appointments.  I also discussed with the patient that there may be a patient responsible charge related to this service. The patient expressed understanding and agreed to proceed.   History of Present Illness:  History of Present Illness Barbara Parks is a 73 year old female who presents with worsening rash on her arms and hands.  The rash on her forearms and hands has been worsening since May 15, 2024, spreading to her palms. Despite completing a course of steroids, there is no relief. The rash is intensely pruritic, affecting her sleep. Eucerin provides some relief, and Benadryl  aids in sleep. She discontinued Capivasertib  and has not changed detergents, soaps, or dietary habits.    Oncology History  Carcinoma of right breast metastatic to lung (HCC)  10/10/2011 Initial Diagnosis   Carcinoma of right breast metastatic to lung (HCC)   05/02/2022 - 06/06/2023 Chemotherapy   Patient is on Treatment Plan : BREAST METASTATIC Fam-Trastuzumab Deruxtecan-nxki  (Enhertu ) (5.4) q21d     Malignant neoplasm of upper-outer quadrant of right breast in female, estrogen receptor positive (HCC)  03/22/2004 Initial Diagnosis   right mastectomy and sentinel lymph node sampling 03/22/2004 for a right upper-outer quadrant pT2 pN1, stage IIB invasive ductal carcinoma with lobular features, grade 2, estrogen receptor and progesterone receptor positive, HER-2 negative, with an MIB-1 of 9%   05/2004 - 09/18/2004 Chemotherapy   adjuvant chemotherapy with dose dense doxorubicin and cyclophosphamide x4 cycles (first cycle delayed one week) followed by  dose dense paclitaxel x4 was completed 09/18/2004   09/2004 - 2009 Anti-estrogen oral therapy   tamoxifen started March 2006, discontinued 2009   08/2011 Relapse/Recurrence   Metastatic disease:large pericardial effusion, large right pleural effusion and possible right middle lobe bronchial obstruction  status post pericardial window placement, fiberoptic bronchoscopy and right Pleurx placement 09/11/2011, with biopsy of the bronchus intermedius and pericardium positive for metastatic breast cancer, ER 91%, PR 100% p with an MIB-1 of 35%, and no HER-2 amplification, ratio being 1.37   10/10/2011 - 07/2019 Anti-estrogen oral therapy   BOLERO-4 trial 10/10/2011, receiving letrozole  and everolimus  (stopped for progression)   08/16/2019 Miscellaneous   foundation 1 requested on liver biopsy from 08/16/2019 shows a stable microsatellite status with 1 mutation/MB, amplification of C11orf30, FGF10 and RICTOR, with mutations in ESR1 and PTEN   08/16/2019 Miscellaneous   Liver biopsy: Metastatic carcinoma to the liver consistent with breast primary, ER 100%, PR 100%, Ki-67 5%, HER2 2+ by IHC, FISH HER2 negative   08/24/2019 -  Anti-estrogen oral therapy   fulvestrant  started 08/24/2019, repeated every 4 weeks Palbociclib  125 mg daily 21 days on 7 days off started 08/25/2019 Switched to ribociclib  s starting 08/22/2020   02/28/2021 -  Radiation Therapy   palliative radiation (Dr. Dannielle in [place]) 02/28/2021 to right hip area   10/27/2021 -  Anti-estrogen oral therapy   Abemaciclib  with fulvestrant    01/14/2022 Miscellaneous   Guardant360: ESR 1 mutation (no other mutations) MSI high was not detected, PTEN, T p53, EGFR amplification, RB1 mutations detected without any approved treatment options   Malignant neoplasm metastatic to liver (HCC)  08/09/2019 Initial Diagnosis   Malignant neoplasm metastatic to liver (  HCC)     REVIEW OF SYSTEMS:   Constitutional: Denies fevers, chills or abnormal weight  loss All other systems were reviewed with the patient and are negative. Observations/Objective:     Assessment Plan:  Carcinoma of right breast metastatic to lung [facility]) Metastatic breast cancer (pericardial effusion, pleural effusion, right middle lobe lung lesion, ER 91%, PR 90%, Ki-67 35%, HER2 negative diagnosed February 2023)   (Prior history of right mastectomy 2005, adjuvant chemo with Adriamycin Cytoxan and Taxol followed by tamoxifen)   Current treatment:  1. Ribociclib , fulvestrant , Xgeva , ribociclib  discontinued 10/18/2021  2. abemaciclib  started 10/27/2021 3. Enhertu  discontinued for progression after 16 cycles on 06/06/2023 4.  Elacestrant  06/18/2023-03/16/2024   Foundation 1 in 2021: MSI stable, TMB 1, ESR 1 mutation, PTEN, FGF 10, RICTOR amplifications   Guardant360 01/14/2022: No actionable mutations.  ESR 1 mutation. Liver biopsy 02/26/2022: Metastatic carcinoma consistent with breast primary. Caris mutation testing 02/26/2022: HER2 low, ESR 1 mutation, PTEN She has a puppy named Ricky Erle Tzu 7 months old).    01/23/2024: CT CAP: Moderate loculated right pleural effusion, stable liver lesions, stable bone metastasis 03/05/2024: MRI liver: Mixed response to hepatic metastases some are smaller, few new lesions, mild progression of bone metastases    CA 27-29: 356 on 03/01/2024 (was 149 on 09/15/2023) Port Blood clot: Currently on anticoagulation with Eliquis    Current treatment: Capivasertib  started 04/13/2024 Capivasertib  toxicities: Intermittent diarrhea Fatigue Maculopapular rash abdomen chest and legs started 05/15/24: Has not improved with Medrol  Dosepak.  Will request Dermatology referral.  Continuing to hold Capivasertib .   Severe mid to lower back pain: MRI lumbar spine 04/29/2024: Metastatic lesions of T11, L3, left side of the sacrum and left iliac bone, 1 cm right lobe liver lesion likely metastasis compression fracture L2 currently on Percocets.   Left shoulder pain:  Recommended radiation.  Patient is undecided  Follow-up in 2 weeks Assessment & Plan Pruritic rash of forearms and palms Rash worsened despite steroids, spreading to forearms and palms. No clear etiology identified. Lack of steroid response necessitates dermatology evaluation. - Refer to dermatologist for further evaluation and management. - Advise against restarting Capivasertib  (Trucap) until further notice. - Recommend Benadryl  for symptomatic relief of itching, especially at night.      I discussed the assessment and treatment plan with the patient. The patient was provided an opportunity to ask questions and all were answered. The patient agreed with the plan and demonstrated an understanding of the instructions. The patient was advised to call back or seek an in-person evaluation if the symptoms worsen or if the condition fails to improve as anticipated.   I provided 20 minutes of non-face-to-face time during this encounter.  This includes time for charting and coordination of care   Naomi MARLA Chad, MD

## 2024-05-25 ENCOUNTER — Encounter: Payer: Self-pay | Admitting: Hematology and Oncology

## 2024-05-25 ENCOUNTER — Encounter: Payer: Self-pay | Admitting: *Deleted

## 2024-05-25 ENCOUNTER — Other Ambulatory Visit: Payer: Self-pay | Admitting: *Deleted

## 2024-05-25 MED ORDER — METHYLPREDNISOLONE 4 MG PO TBPK: ORAL_TABLET | ORAL | 0 refills | Status: DC

## 2024-05-25 MED ORDER — AMOXICILLIN-POT CLAVULANATE 875-125 MG PO TABS: 1.0000 | ORAL_TABLET | Freq: Two times a day (BID) | ORAL | 0 refills | Status: DC

## 2024-05-25 NOTE — Progress Notes (Signed)
 Per MD request RN successfully faxed referral to [facility] for consult for biopsy [phone]).

## 2024-05-25 NOTE — Progress Notes (Signed)
 Received call from pt stating the rash has become worse since yesterday.  Pt states it is now on her face and around her eyes, causing her eyes to be swollen.  Per MD pt needing to be prescribed Medrol Dosepak as well as Augmentin 875 p.o BID x1 week.  Prescription sent to pharmacy on file.  RN also reached out to [facility] regarding the urgent need for an appt.  Receptionist states she will discuss with MD and will alert our office if appt can be made for this week.

## 2024-05-26 ENCOUNTER — Telehealth: Payer: Self-pay | Admitting: Dietician

## 2024-05-26 NOTE — Telephone Encounter (Signed)
Third and final attempt to reach patient by telephone. Have left messages with return number. Please consult RD for future needs.    April Manson, RDN, LDN Registered Dietitian, [place] Part Time Remote (Usual office hours: Tuesday-Thursday) Mobile: [phone] Remote Office: [phone]

## 2024-05-27 DIAGNOSIS — L309 Dermatitis, unspecified: Secondary | ICD-10-CM | POA: Diagnosis not present

## 2024-05-27 DIAGNOSIS — D485 Neoplasm of uncertain behavior of skin: Secondary | ICD-10-CM | POA: Diagnosis not present

## 2024-05-27 DIAGNOSIS — L308 Other specified dermatitis: Secondary | ICD-10-CM | POA: Diagnosis not present

## 2024-05-28 ENCOUNTER — Other Ambulatory Visit (HOSPITAL_COMMUNITY): Payer: Self-pay

## 2024-06-07 ENCOUNTER — Inpatient Hospital Stay

## 2024-06-08 ENCOUNTER — Encounter: Payer: Self-pay | Admitting: Hematology and Oncology

## 2024-06-08 ENCOUNTER — Inpatient Hospital Stay: Attending: Hematology and Oncology

## 2024-06-08 ENCOUNTER — Other Ambulatory Visit: Payer: Self-pay | Admitting: *Deleted

## 2024-06-08 ENCOUNTER — Other Ambulatory Visit (HOSPITAL_COMMUNITY): Payer: Self-pay

## 2024-06-08 ENCOUNTER — Telehealth: Payer: Self-pay

## 2024-06-08 ENCOUNTER — Inpatient Hospital Stay

## 2024-06-08 ENCOUNTER — Inpatient Hospital Stay (HOSPITAL_BASED_OUTPATIENT_CLINIC_OR_DEPARTMENT_OTHER): Admitting: Hematology and Oncology

## 2024-06-08 ENCOUNTER — Telehealth: Payer: Self-pay | Admitting: Pharmacist

## 2024-06-08 VITALS — BP 100/83 | HR 117 | Temp 98.3°F | Resp 20 | Ht 64.5 in | Wt 145.3 lb

## 2024-06-08 VITALS — BP 115/89 | HR 113 | Resp 16

## 2024-06-08 DIAGNOSIS — R0600 Dyspnea, unspecified: Secondary | ICD-10-CM | POA: Diagnosis not present

## 2024-06-08 DIAGNOSIS — C7802 Secondary malignant neoplasm of left lung: Secondary | ICD-10-CM | POA: Insufficient documentation

## 2024-06-08 DIAGNOSIS — C7951 Secondary malignant neoplasm of bone: Secondary | ICD-10-CM | POA: Insufficient documentation

## 2024-06-08 DIAGNOSIS — Z79899 Other long term (current) drug therapy: Secondary | ICD-10-CM | POA: Diagnosis not present

## 2024-06-08 DIAGNOSIS — J910 Malignant pleural effusion: Secondary | ICD-10-CM | POA: Insufficient documentation

## 2024-06-08 DIAGNOSIS — R0602 Shortness of breath: Secondary | ICD-10-CM | POA: Diagnosis not present

## 2024-06-08 DIAGNOSIS — Z515 Encounter for palliative care: Secondary | ICD-10-CM | POA: Insufficient documentation

## 2024-06-08 DIAGNOSIS — C50911 Malignant neoplasm of unspecified site of right female breast: Secondary | ICD-10-CM | POA: Diagnosis not present

## 2024-06-08 DIAGNOSIS — C7800 Secondary malignant neoplasm of unspecified lung: Secondary | ICD-10-CM

## 2024-06-08 DIAGNOSIS — Z95828 Presence of other vascular implants and grafts: Secondary | ICD-10-CM

## 2024-06-08 DIAGNOSIS — R630 Anorexia: Secondary | ICD-10-CM | POA: Diagnosis not present

## 2024-06-08 DIAGNOSIS — Z5111 Encounter for antineoplastic chemotherapy: Secondary | ICD-10-CM | POA: Insufficient documentation

## 2024-06-08 DIAGNOSIS — C7801 Secondary malignant neoplasm of right lung: Secondary | ICD-10-CM | POA: Diagnosis not present

## 2024-06-08 DIAGNOSIS — C787 Secondary malignant neoplasm of liver and intrahepatic bile duct: Secondary | ICD-10-CM | POA: Insufficient documentation

## 2024-06-08 DIAGNOSIS — C50411 Malignant neoplasm of upper-outer quadrant of right female breast: Secondary | ICD-10-CM | POA: Insufficient documentation

## 2024-06-08 LAB — CMP (CANCER CENTER ONLY)
ALT: 12 U/L (ref 0–44)
AST: 19 U/L (ref 15–41)
Albumin: 3.7 g/dL (ref 3.5–5.0)
Alkaline Phosphatase: 207 U/L — ABNORMAL HIGH (ref 38–126)
Anion gap: 10 (ref 5–15)
BUN: 17 mg/dL (ref 8–23)
CO2: 17 mmol/L — ABNORMAL LOW (ref 22–32)
Calcium: 9.1 mg/dL (ref 8.9–10.3)
Chloride: 110 mmol/L (ref 98–111)
Creatinine: 0.84 mg/dL (ref 0.44–1.00)
GFR, Estimated: >60 mL/min (ref >60)
Glucose, Bld: 124 mg/dL — ABNORMAL HIGH (ref 70–99)
Potassium: 3.7 mmol/L (ref 3.5–5.1)
Sodium: 137 mmol/L (ref 135–145)
Total Bilirubin: 0.5 mg/dL (ref 0.0–1.2)
Total Protein: 7.5 g/dL (ref 6.5–8.1)

## 2024-06-08 LAB — CBC WITH DIFFERENTIAL (CANCER CENTER ONLY)
Abs Immature Granulocytes: 0.1 K/uL — ABNORMAL HIGH (ref 0.00–0.07)
Basophils Absolute: 0.1 K/uL (ref 0.0–0.1)
Basophils Relative: 1 %
Eosinophils Absolute: 0.9 K/uL — ABNORMAL HIGH (ref 0.0–0.5)
Eosinophils Relative: 7 %
HCT: 38.8 % (ref 36.0–46.0)
Hemoglobin: 12.6 g/dL (ref 12.0–15.0)
Immature Granulocytes: 1 %
Lymphocytes Relative: 5 %
Lymphs Abs: 0.7 K/uL (ref 0.7–4.0)
MCH: 28.1 pg (ref 26.0–34.0)
MCHC: 32.5 g/dL (ref 30.0–36.0)
MCV: 86.4 fL (ref 80.0–100.0)
Monocytes Absolute: 1.3 K/uL — ABNORMAL HIGH (ref 0.1–1.0)
Monocytes Relative: 9 %
Neutro Abs: 11.2 K/uL — ABNORMAL HIGH (ref 1.7–7.7)
Neutrophils Relative %: 77 %
Platelet Count: 347 K/uL (ref 150–400)
RBC: 4.49 MIL/uL (ref 3.87–5.11)
RDW: 17.6 % — ABNORMAL HIGH (ref 11.5–15.5)
WBC Count: 14.3 K/uL — ABNORMAL HIGH (ref 4.0–10.5)
nRBC: 0 % (ref 0.0–0.2)

## 2024-06-08 MED ORDER — CAPECITABINE 500 MG PO TABS
625.0000 mg/m2 | ORAL_TABLET | Freq: Two times a day (BID) | ORAL | 6 refills | Status: DC
  Filled 2024-06-15: qty 56, 21d supply, fill #0
  Filled 2024-06-30 – 2024-07-02 (×2): qty 56, 21d supply, fill #1
  Filled 2024-07-20 – 2024-07-21 (×2): qty 56, 21d supply, fill #2

## 2024-06-08 MED ORDER — DENOSUMAB 120 MG/1.7ML ~~LOC~~ SOLN
120.0000 mg | Freq: Once | SUBCUTANEOUS | Status: AC
  Administered 2024-06-08: 120 mg via SUBCUTANEOUS

## 2024-06-08 MED ORDER — DENOSUMAB 120 MG/1.7ML ~~LOC~~ SOLN: 120.0000 mg | Freq: Once | SUBCUTANEOUS | Status: DC

## 2024-06-08 MED ORDER — FULVESTRANT 250 MG/5ML IM SOSY: 500.0000 mg | PREFILLED_SYRINGE | Freq: Once | INTRAMUSCULAR | Status: DC

## 2024-06-08 MED ORDER — FULVESTRANT 250 MG/5ML IM SOSY
500.0000 mg | PREFILLED_SYRINGE | Freq: Once | INTRAMUSCULAR | Status: AC
  Administered 2024-06-08: 500 mg via INTRAMUSCULAR

## 2024-06-08 MED ORDER — LIDOCAINE-PRILOCAINE 2.5-2.5 % EX CREA: 1.0000 | TOPICAL_CREAM | CUTANEOUS | 5 refills | Status: AC | PRN

## 2024-06-08 MED ORDER — GEMFIBROZIL 600 MG PO TABS: 600.0000 mg | ORAL_TABLET | Freq: Two times a day (BID) | ORAL | 2 refills | Status: AC

## 2024-06-08 NOTE — Progress Notes (Signed)
 Patient Care Team: Dyane Anthony RAMAN, FNP as PCP - General (Family Medicine) Alvan Ronal BRAVO, MD (Inactive) as PCP - Cardiology (Cardiology) Jeffrie Oneil BROCKS, MD (Cardiology) Lucas Dorise POUR, MD (Cardiothoracic Surgery) Lucila Norleen LABOR, RPH-CPP (Pharmacist) Dannielle Alm BRAVO, MD as Referring Physician (Radiation Oncology) Odean Potts, MD as Consulting Physician (Hematology and Oncology)  DIAGNOSIS:  Encounter Diagnosis  Name Primary?   Carcinoma of right breast metastatic to lung (HCC) Yes    SUMMARY OF ONCOLOGIC HISTORY: Oncology History  Carcinoma of right breast metastatic to lung (HCC)  10/10/2011 Initial Diagnosis   Carcinoma of right breast metastatic to lung (HCC)   05/02/2022 - 06/06/2023 Chemotherapy   Patient is on Treatment Plan : BREAST METASTATIC Fam-Trastuzumab Deruxtecan-nxki  (Enhertu ) (5.4) q21d     Malignant neoplasm of upper-outer quadrant of right breast in female, estrogen receptor positive (HCC)  03/22/2004 Initial Diagnosis   right mastectomy and sentinel lymph node sampling 03/22/2004 for a right upper-outer quadrant pT2 pN1, stage IIB invasive ductal carcinoma with lobular features, grade 2, estrogen receptor and progesterone receptor positive, HER-2 negative, with an MIB-1 of 9%   05/2004 - 09/18/2004 Chemotherapy   adjuvant chemotherapy with dose dense doxorubicin and cyclophosphamide x4 cycles (first cycle delayed one week) followed by dose dense paclitaxel x4 was completed 09/18/2004   09/2004 - 2009 Anti-estrogen oral therapy   tamoxifen started March 2006, discontinued 2009   08/2011 Relapse/Recurrence   Metastatic disease:large pericardial effusion, large right pleural effusion and possible right middle lobe bronchial obstruction  status post pericardial window placement, fiberoptic bronchoscopy and right Pleurx placement 09/11/2011, with biopsy of the bronchus intermedius and pericardium positive for metastatic breast cancer, ER 91%, PR 100% p with an MIB-1  of 35%, and no HER-2 amplification, ratio being 1.37   10/10/2011 - 07/2019 Anti-estrogen oral therapy   BOLERO-4 trial 10/10/2011, receiving letrozole  and everolimus  (stopped for progression)   08/16/2019 Miscellaneous   foundation 1 requested on liver biopsy from 08/16/2019 shows a stable microsatellite status with 1 mutation/MB, amplification of C11orf30, FGF10 and RICTOR, with mutations in ESR1 and PTEN   08/16/2019 Miscellaneous   Liver biopsy: Metastatic carcinoma to the liver consistent with breast primary, ER 100%, PR 100%, Ki-67 5%, HER2 2+ by IHC, FISH HER2 negative   08/24/2019 -  Anti-estrogen oral therapy   fulvestrant  started 08/24/2019, repeated every 4 weeks Palbociclib  125 mg daily 21 days on 7 days off started 08/25/2019 Switched to ribociclib  s starting 08/22/2020   02/28/2021 -  Radiation Therapy   palliative radiation (Dr. Dannielle in [place]) 02/28/2021 to right hip area   10/27/2021 -  Anti-estrogen oral therapy   Abemaciclib  with fulvestrant    01/14/2022 Miscellaneous   Guardant360: ESR 1 mutation (no other mutations) MSI high was not detected, PTEN, T p53, EGFR amplification, RB1 mutations detected without any approved treatment options   Malignant neoplasm metastatic to liver (HCC)  08/09/2019 Initial Diagnosis   Malignant neoplasm metastatic to liver (HCC)     CHIEF COMPLIANT: Follow-up to discuss treatment plan  HISTORY OF PRESENT ILLNESS:  History of Present Illness Barbara Parks is a 73 year old female with metastatic breast cancer who presents with a persistent rash and shortness of breath.  She has a persistent rash on her arms, legs, and back, characterized by burning, itching, and peeling skin. The rash resembles 'alligator skin' and is somewhat relieved by steroid cream, though relief is temporary. A biopsy has been performed, and results are pending.  Severe shortness of  breath has been present for the past week, limiting her ability to perform daily  activities. She also experiences dizziness and palpitations. These symptoms have slightly improved over the past two days but remain concerning.  Diarrhea has persisted for over two weeks despite discontinuing Trucapt. She takes medication daily to manage this symptom, but it continues.  She experiences significant shoulder pain, which is tender to touch and restricts arm use.     ALLERGIES:  is allergic to aspirin, latex, and nsaids.  MEDICATIONS:  Current Outpatient Medications  Medication Sig Dispense Refill   capecitabine (XELODA) 500 MG tablet Take 2 tablets (1,000 mg total) by mouth 2 (two) times daily after a meal. Take for 14 days and then off for 7 days 56 tablet 6   carvedilol  (COREG ) 3.125 MG tablet Take 1 tablet (3.125 mg total) by mouth 2 (two) times daily with a meal. 180 tablet 3   cetirizine (ZYRTEC) 10 MG tablet Take 10 mg by mouth daily.     cholecalciferol (VITAMIN D3) 25 MCG (1000 UT) tablet Take 2 tablets (2,000 Units total) by mouth daily. 180 tablet 4   cyclobenzaprine  (FLEXERIL ) 10 MG tablet Take 1 tablet (10 mg total) by mouth 3 (three) times daily as needed for muscle spasms. 60 tablet 0   gemfibrozil  (LOPID ) 600 MG tablet TAKE 1 TABLET (600 MG TOTAL) BY MOUTH 2 (TWO) TIMES DAILY BEFORE A MEAL. 180 tablet 2   loperamide  (IMODIUM ) 2 MG capsule Take 2 mg by mouth 4 (four) times daily as needed for diarrhea or loose stools. Reported on 11/30/2015     magic mouthwash (nystatin, lidocaine , diphenhydrAMINE , alum & mag hydroxide) suspension Take 5 mLs by mouth 4 (four) times daily as needed for mouth pain. 240 mL 2   methylPREDNISolone (MEDROL DOSEPAK) 4 MG TBPK tablet Use as directed 21 tablet 0   omeprazole  (PRILOSEC OTC) 20 MG tablet Take 20 mg by mouth daily as needed (acid reflux).     oxyCODONE -acetaminophen  (PERCOCET/ROXICET) 5-325 MG tablet Take 1 tablet by mouth every 8 (eight) hours as needed for severe pain (pain score 7-10). 60 tablet 0   prochlorperazine   (COMPAZINE ) 10 MG tablet Take 10 mg by mouth every 6 (six) hours as needed for nausea or vomiting.     No current facility-administered medications for this visit.   Facility-Administered Medications Ordered in Other Visits  Medication Dose Route Frequency Provider Last Rate Last Admin   denosumab  (XGEVA ) injection 120 mg  120 mg Subcutaneous Once Marilynn Ekstein, MD       fulvestrant  (FASLODEX ) injection 500 mg  500 mg Intramuscular Once Cara Aguino, MD        PHYSICAL EXAMINATION: ECOG PERFORMANCE STATUS: 1 - Symptomatic but completely ambulatory  Vitals:   06/08/24 1131  BP: 100/83  Pulse: (!) 117  Resp: 20  Temp: 98.3 F (36.8 C)  SpO2: 100%   Filed Weights   06/08/24 1131  Weight: 145 lb 4.8 oz (65.9 kg)    Physical Exam CHEST: Fluid at the base of the right lung.  Mild to moderate  (exam performed in the presence of a chaperone)  LABORATORY DATA:  I have reviewed the data as listed    Latest Ref Rng & Units 05/10/2024   11:28 AM 04/26/2024    9:15 AM 04/16/2024    9:49 AM  CMP  Glucose 70 - 99 mg/dL 897  879  856   BUN 8 - 23 mg/dL 17  25  15    Creatinine  0.44 - 1.00 mg/dL 8.96  8.84  9.05   Sodium 135 - 145 mmol/L 138  138  139   Potassium 3.5 - 5.1 mmol/L 4.4  3.9  4.0   Chloride 98 - 111 mmol/L 110  107  112   CO2 22 - 32 mmol/L 18  19  20    Calcium  8.9 - 10.3 mg/dL 9.9  9.2  9.0   Total Protein 6.5 - 8.1 g/dL 8.8  8.7  7.8   Total Bilirubin 0.0 - 1.2 mg/dL 0.4  0.8  0.3   Alkaline Phos 38 - 126 U/L 200  181  144   AST 15 - 41 U/L 19  25  15    ALT 0 - 44 U/L 9  11  7      Lab Results  Component Value Date   WBC 14.3 (H) 06/08/2024   HGB 12.6 06/08/2024   HCT 38.8 06/08/2024   MCV 86.4 06/08/2024   PLT 347 06/08/2024   NEUTROABS 11.2 (H) 06/08/2024    ASSESSMENT & PLAN:  Carcinoma of right breast metastatic to lung [facility]) Metastatic breast cancer (pericardial effusion, pleural effusion, right middle lobe lung lesion, ER 91%, PR 90%, Ki-67 35%,  HER2 negative diagnosed February 2023)   (Prior history of right mastectomy 2005, adjuvant chemo with Adriamycin Cytoxan and Taxol followed by tamoxifen)   Current treatment:  1. Ribociclib , fulvestrant , Xgeva , ribociclib  discontinued 10/18/2021  2. abemaciclib  started 10/27/2021 3. Enhertu  discontinued for progression after 16 cycles on 06/06/2023 4.  Elacestrant  06/18/2023-03/16/2024   Foundation 1 in 2021: MSI stable, TMB 1, ESR 1 mutation, PTEN, FGF 10, RICTOR amplifications   Guardant360 01/14/2022: No actionable mutations.  ESR 1 mutation. Liver biopsy 02/26/2022: Metastatic carcinoma consistent with breast primary. Caris mutation testing 02/26/2022: HER2 low, ESR 1 mutation, PTEN She has a puppy named [name]).    01/23/2024: CT CAP: Moderate loculated right pleural effusion, stable liver lesions, stable bone metastasis 03/05/2024: MRI liver: Mixed response to hepatic metastases some are smaller, few new lesions, mild progression of bone metastases    CA 27-29: 356 on 03/01/2024 (was 149 on 09/15/2023) Port Blood clot: Currently on anticoagulation with Eliquis    Current treatment: Capivasertib  started 04/13/2024-05/15/2024 discontinued for rash and diarrhea Treatment plan: Xeloda 1000 mg p.o. twice daily 14 days on 7 days off to start as soon as the medicine becomes available  Profound itching with rash: Seeing dermatology.  Biopsy was performed results are pending.  She was prescribed steroid cream.  The rash appears to have resolved but the itching has persisted.  Severe mid to lower back pain: MRI lumbar spine 04/29/2024: Metastatic lesions of T11, L3, left side of the sacrum and left iliac bone, 1 cm right lobe liver lesion likely metastasis compression fracture L2 currently on Percocets.   Left shoulder pain: Recommended radiation.  We will obtain CT chest abdomen pelvis for further evaluation and then make a referral to radiation if necessary.  Palliative care  consultation  Shortness of breath to minimal exertion: To my exam there is mild right pleural effusion.  Will order CT chest abdomen pelvis and assess how much fluid she has.  If there is substantial we can consider thoracentesis.   Follow-up with Norleen along with her CT scan appointment for education follow-up with me in 2 weeks to discuss results of scans and assess tolerance to Xeloda.       Orders Placed This Encounter  Procedures   CT CHEST ABDOMEN PELVIS W CONTRAST  Standing Status:   Future    Expected Date:   06/15/2024    Expiration Date:   06/08/2025    If indicated for the ordered procedure, I authorize the administration of contrast media per Radiology protocol:   Yes    Does the patient have a contrast media/X-ray dye allergy?:   No    Preferred imaging location?:   [facility]    Release to patient:   Immediate    If indicated for the ordered procedure, I authorize the administration of oral contrast media per Radiology protocol:   No    Reason for no oral contrast::   breast   The patient has a good understanding of the overall plan. she agrees with it. she will call with any problems that may develop before the next visit here.  I personally spent a total of 45 minutes in the care of the patient today including preparing to see the patient, getting/reviewing separately obtained history, performing a medically appropriate exam/evaluation, counseling and educating, placing orders, referring and communicating with other health care professionals, documenting clinical information in the EHR, independently interpreting results, communicating results, and coordinating care.   Viinay K Kimisha Eunice, MD 06/08/24

## 2024-06-08 NOTE — Assessment & Plan Note (Addendum)
 Metastatic breast cancer (pericardial effusion, pleural effusion, right middle lobe lung lesion, ER 91%, PR 90%, Ki-67 35%, HER2 negative diagnosed February 2023)   (Prior history of right mastectomy 2005, adjuvant chemo with Adriamycin Cytoxan and Taxol followed by tamoxifen)   Current treatment:  1. Ribociclib , fulvestrant , Xgeva , ribociclib  discontinued 10/18/2021  2. abemaciclib  started 10/27/2021 3. Enhertu  discontinued for progression after 16 cycles on 06/06/2023 4.  Elacestrant  06/18/2023-03/16/2024   Foundation 1 in 2021: MSI stable, TMB 1, ESR 1 mutation, PTEN, FGF 10, RICTOR amplifications   Guardant360 01/14/2022: No actionable mutations.  ESR 1 mutation. Liver biopsy 02/26/2022: Metastatic carcinoma consistent with breast primary. Caris mutation testing 02/26/2022: HER2 low, ESR 1 mutation, PTEN She has a puppy named Ricky Erle Tzu 7 months old).    01/23/2024: CT CAP: Moderate loculated right pleural effusion, stable liver lesions, stable bone metastasis 03/05/2024: MRI liver: Mixed response to hepatic metastases some are smaller, few new lesions, mild progression of bone metastases    CA 27-29: 356 on 03/01/2024 (was 149 on 09/15/2023) Port Blood clot: Currently on anticoagulation with Eliquis    Current treatment: Capivasertib  started 04/13/2024 Capivasertib  toxicities: Intermittent diarrhea Fatigue Maculopapular rash abdomen chest and legs started 05/15/24: Has not improved with Medrol Dosepak.  Will request Dermatology referral.  Continuing to hold Capivasertib .  Second round of Medrol Dosepak was given along with Augmentin   Severe mid to lower back pain: MRI lumbar spine 04/29/2024: Metastatic lesions of T11, L3, left side of the sacrum and left iliac bone, 1 cm right lobe liver lesion likely metastasis compression fracture L2 currently on Percocets.   Left shoulder pain: Recommended radiation.  Patient is undecided   Follow-up

## 2024-06-08 NOTE — Telephone Encounter (Signed)
 [facility]        Telephone: [phone]?Fax: [phone]   Oncology Clinical Pharmacist Practitioner Initial Assessment  Received new prescription for capecitabine for the treatment of metastatic breast cancer. This is being given in combination with denosumab  120 mg. It is planned to continue until disease progression or unacceptable toxicity.  Labs from 06/08/24 assessed. Prescription dose and frequency assessed.   Current medication list in Epic reviewed. Significant DDIs with capecitabine identified:No. Will review potential interaction with omeprazole  and capecitabine with patient.  Evaluated chart, patient barriers to medication adherence identified: No.  Patient agreement for treatment documented in MD note on 06/08/24.  Patient received denosumab  on 06/08/24 and will be due again in 12 weeks (09/01/24).  Prescription has been e-scribed to the [facility]) for benefits analysis and approval.  Oral Oncology Clinic will continue to follow for insurance authorization, copayment issues, initial counseling and start date.  Deanta Mincey A. Lucila, PharmD, BCOP, CPP Hematology-Oncology Clinical Pharmacist Practitioner  06/08/2024 2:33 PM  **Disclaimer: This note was dictated with voice recognition software. Similar sounding words can inadvertently be transcribed and this note may contain transcription errors which may not have been corrected upon publication of note.**

## 2024-06-08 NOTE — Telephone Encounter (Signed)
 Oral Oncology Patient Advocate Encounter  After completing a benefits investigation, prior authorization for Capecitabine is not required at this time through Medicare  Part B.  Patient's copay is $44.72.      Charlott Hamilton,  CPhT-Adv  she/her/hers [facility]  [facility] Pharmacy Technician Patient Advocate Specialist III [facility] Phone: [phone]  Fax: [phone] [email or web address]

## 2024-06-09 ENCOUNTER — Inpatient Hospital Stay (HOSPITAL_BASED_OUTPATIENT_CLINIC_OR_DEPARTMENT_OTHER): Admitting: Nurse Practitioner

## 2024-06-09 ENCOUNTER — Ambulatory Visit (HOSPITAL_COMMUNITY)
Admission: RE | Admit: 2024-06-09 | Discharge: 2024-06-09 | Disposition: A | Discharge status: Home / Self Care | Source: Ambulatory Visit | Attending: Nurse Practitioner | Admitting: Nurse Practitioner

## 2024-06-09 ENCOUNTER — Encounter: Payer: Self-pay | Admitting: Nurse Practitioner

## 2024-06-09 VITALS — BP 112/77 | HR 121 | Temp 97.3°F | Resp 20 | Wt 147.6 lb

## 2024-06-09 DIAGNOSIS — C50911 Malignant neoplasm of unspecified site of right female breast: Secondary | ICD-10-CM | POA: Diagnosis not present

## 2024-06-09 DIAGNOSIS — C7800 Secondary malignant neoplasm of unspecified lung: Secondary | ICD-10-CM | POA: Insufficient documentation

## 2024-06-09 DIAGNOSIS — G893 Neoplasm related pain (acute) (chronic): Secondary | ICD-10-CM

## 2024-06-09 DIAGNOSIS — R530 Neoplastic (malignant) related fatigue: Secondary | ICD-10-CM | POA: Diagnosis not present

## 2024-06-09 DIAGNOSIS — Z515 Encounter for palliative care: Secondary | ICD-10-CM | POA: Insufficient documentation

## 2024-06-09 DIAGNOSIS — C7951 Secondary malignant neoplasm of bone: Secondary | ICD-10-CM | POA: Insufficient documentation

## 2024-06-09 DIAGNOSIS — Z5111 Encounter for antineoplastic chemotherapy: Secondary | ICD-10-CM | POA: Diagnosis not present

## 2024-06-09 DIAGNOSIS — Z7189 Other specified counseling: Secondary | ICD-10-CM | POA: Diagnosis not present

## 2024-06-09 DIAGNOSIS — R0602 Shortness of breath: Secondary | ICD-10-CM

## 2024-06-09 DIAGNOSIS — C787 Secondary malignant neoplasm of liver and intrahepatic bile duct: Secondary | ICD-10-CM | POA: Diagnosis not present

## 2024-06-09 DIAGNOSIS — C7801 Secondary malignant neoplasm of right lung: Secondary | ICD-10-CM | POA: Diagnosis not present

## 2024-06-09 DIAGNOSIS — C50919 Malignant neoplasm of unspecified site of unspecified female breast: Secondary | ICD-10-CM | POA: Diagnosis not present

## 2024-06-09 DIAGNOSIS — C7802 Secondary malignant neoplasm of left lung: Secondary | ICD-10-CM | POA: Diagnosis not present

## 2024-06-09 DIAGNOSIS — C50411 Malignant neoplasm of upper-outer quadrant of right female breast: Secondary | ICD-10-CM | POA: Diagnosis not present

## 2024-06-09 DIAGNOSIS — J90 Pleural effusion, not elsewhere classified: Secondary | ICD-10-CM | POA: Diagnosis not present

## 2024-06-09 LAB — CANCER ANTIGEN 27.29: CA 27.29: 354.5 U/mL — ABNORMAL HIGH (ref 0.0–38.6)

## 2024-06-09 MED ORDER — TRAMADOL HCL 50 MG PO TABS: 50.0000 mg | ORAL_TABLET | Freq: Four times a day (QID) | ORAL | 0 refills | Status: DC | PRN

## 2024-06-10 ENCOUNTER — Other Ambulatory Visit (HOSPITAL_COMMUNITY): Payer: Self-pay

## 2024-06-10 ENCOUNTER — Encounter: Payer: Self-pay | Admitting: Hematology and Oncology

## 2024-06-10 ENCOUNTER — Ambulatory Visit: Payer: Self-pay | Admitting: Nurse Practitioner

## 2024-06-10 DIAGNOSIS — L309 Dermatitis, unspecified: Secondary | ICD-10-CM | POA: Diagnosis not present

## 2024-06-10 NOTE — Telephone Encounter (Signed)
 Oral Oncology Patient Advocate Encounter  Patient would like home delivery for medication Capecitabine  Thursday 06-17-24 Copayment $44.72. Payment info has been provided.     Charlott Hamilton,  CPhT-Adv  she/her/hers [facility]  [facility] Pharmacy Technician Patient Advocate Specialist III [facility] Phone: [phone]  Fax: [phone] [email or web address]

## 2024-06-10 NOTE — Progress Notes (Signed)
 Palliative Medicine [facility]  [phone] [phone]   Name: Barbara Parks [facility] Date: 06/10/2024 MRN: [id number]  DOB: 1950-11-24  Patient Care Team: Dyane Anthony RAMAN, FNP as PCP - General (Family Medicine) Alvan Ronal BRAVO, MD (Inactive) as PCP - Cardiology (Cardiology) Jeffrie Oneil BROCKS, MD (Cardiology) Lucas Dorise POUR, MD (Cardiothoracic Surgery) Lucila Norleen LABOR, RPH-CPP (Pharmacist) Dannielle Alm BRAVO, MD as Referring Physician (Radiation Oncology) Odean Potts, MD as Consulting Physician (Hematology and Oncology)    REASON FOR CONSULTATION: Barbara Parks is a 73 y.o. female with oncologic medical history including metastatic breast cancer (07/2019) with liver involvement.  Palliative is seeing patient for symptom management and goals of care.    SOCIAL HISTORY:     reports that she quit smoking about 16 years ago. Her smoking use included cigarettes. She started smoking about 46 years ago. She has a 45 pack-year smoking history. She has never used smokeless tobacco. She reports that she does not drink alcohol and does not use drugs.  ADVANCE DIRECTIVES:  None on file.  Patient reports she has a documented advanced directive.  States her close friend/neighbor Barbara Parks is her primary medical decision-maker and support of her sister, Barbara Parks who lives out of state.  CODE STATUS: Full code  PAST MEDICAL HISTORY: Past Medical History:  Diagnosis Date   Arthritis    left hip   Asthma    breast ca 2005   breast/chemo R mastectomy   Breast cancer (HCC)    Dermatitis    Diabetes mellitus without complication (HCC)    GERD (gastroesophageal reflux disease)    Hypercholesteremia    Metastasis to lung (HCC) dx'd 08/2011   Osteopenia due to cancer therapy 09/09/2013   Palpitations    Personal history of chemotherapy    Shortness of breath     PAST SURGICAL HISTORY:  Past Surgical History:  Procedure Laterality Date    BREAST SURGERY  2005   right   CATARACT EXTRACTION W/PHACO Left 01/18/2014   Procedure: CATARACT EXTRACTION PHACO AND INTRAOCULAR LENS PLACEMENT (IOC);  Surgeon: Oneil T. Roz, MD;  Location: [facility];  Service: Ophthalmology;  Laterality: Left;  CDE 15.79   CATARACT EXTRACTION W/PHACO Right 02/08/2014   Procedure: CATARACT EXTRACTION PHACO AND INTRAOCULAR LENS PLACEMENT (IOC);  Surgeon: Oneil T. Roz, MD;  Location: [facility];  Service: Ophthalmology;  Laterality: Right;  CDE 4.41   CHEST TUBE INSERTION  09/11/2011   Procedure: INSERTION PLEURAL DRAINAGE CATHETER;  Surgeon: Dorise POUR Lucas, MD;  Location: [facility] OR;  Service: Thoracic;  Laterality: Right;   IR IMAGING GUIDED PORT INSERTION  04/23/2022   MASTECTOMY Right    PERICARDIAL WINDOW  09/11/2011   Procedure: PERICARDIAL WINDOW;  Surgeon: Dorise POUR Lucas, MD;  Location: [facility] OR;  Service: Thoracic;  Laterality: N/A;   REMOVAL OF PLEURAL DRAINAGE CATHETER  12/19/2011   Procedure: REMOVAL OF PLEURAL DRAINAGE CATHETER;  Surgeon: Dorise POUR Lucas, MD;  Location: [facility] OR;  Service: Thoracic;  Laterality: Right;  TO BE DONE IN MINOR ROOM, SHORT STAY   SPINE SURGERY  1996   TOTAL HIP ARTHROPLASTY Left 06/07/2015   Procedure: LEFT TOTAL HIP ARTHROPLASTY ANTERIOR APPROACH;  Surgeon: Dempsey Moan, MD;  Location: [facility];  Service: Orthopedics;  Laterality: Left;   VIDEO BRONCHOSCOPY  09/11/2011   Procedure: VIDEO BRONCHOSCOPY;  Surgeon: Dorise POUR Lucas, MD;  Location: [facility] OR;  Service: Thoracic;  Laterality: N/A;    HEMATOLOGY/ONCOLOGY HISTORY:  Oncology  History  Carcinoma of right breast metastatic to lung (HCC)  10/10/2011 Initial Diagnosis   Carcinoma of right breast metastatic to lung (HCC)   05/02/2022 - 06/06/2023 Chemotherapy   Patient is on Treatment Plan : BREAST METASTATIC Fam-Trastuzumab Deruxtecan-nxki  (Enhertu ) (5.4) q21d     Malignant neoplasm of upper-outer quadrant of right breast in female, estrogen receptor positive (HCC)  03/22/2004 Initial  Diagnosis   right mastectomy and sentinel lymph node sampling 03/22/2004 for a right upper-outer quadrant pT2 pN1, stage IIB invasive ductal carcinoma with lobular features, grade 2, estrogen receptor and progesterone receptor positive, HER-2 negative, with an MIB-1 of 9%   05/2004 - 09/18/2004 Chemotherapy   adjuvant chemotherapy with dose dense doxorubicin and cyclophosphamide x4 cycles (first cycle delayed one week) followed by dose dense paclitaxel x4 was completed 09/18/2004   09/2004 - 2009 Anti-estrogen oral therapy   tamoxifen started March 2006, discontinued 2009   08/2011 Relapse/Recurrence   Metastatic disease:large pericardial effusion, large right pleural effusion and possible right middle lobe bronchial obstruction  status post pericardial window placement, fiberoptic bronchoscopy and right Pleurx placement 09/11/2011, with biopsy of the bronchus intermedius and pericardium positive for metastatic breast cancer, ER 91%, PR 100% p with an MIB-1 of 35%, and no HER-2 amplification, ratio being 1.37   10/10/2011 - 07/2019 Anti-estrogen oral therapy   BOLERO-4 trial 10/10/2011, receiving letrozole  and everolimus  (stopped for progression)   08/16/2019 Miscellaneous   foundation 1 requested on liver biopsy from 08/16/2019 shows a stable microsatellite status with 1 mutation/MB, amplification of C11orf30, FGF10 and RICTOR, with mutations in ESR1 and PTEN   08/16/2019 Miscellaneous   Liver biopsy: Metastatic carcinoma to the liver consistent with breast primary, ER 100%, PR 100%, Ki-67 5%, HER2 2+ by IHC, FISH HER2 negative   08/24/2019 -  Anti-estrogen oral therapy   fulvestrant  started 08/24/2019, repeated every 4 weeks Palbociclib  125 mg daily 21 days on 7 days off started 08/25/2019 Switched to ribociclib  s starting 08/22/2020   02/28/2021 -  Radiation Therapy   palliative radiation (Dr. Dannielle in [place]) 02/28/2021 to right hip area   10/27/2021 -  Anti-estrogen oral therapy   Abemaciclib   with fulvestrant    01/14/2022 Miscellaneous   Guardant360: ESR 1 mutation (no other mutations) MSI high was not detected, PTEN, T p53, EGFR amplification, RB1 mutations detected without any approved treatment options   Malignant neoplasm metastatic to liver (HCC)  08/09/2019 Initial Diagnosis   Malignant neoplasm metastatic to liver (HCC)     ALLERGIES:  is allergic to aspirin, latex, and nsaids.  MEDICATIONS:  Current Outpatient Medications  Medication Sig Dispense Refill   traMADol  (ULTRAM ) 50 MG tablet Take 1-2 tablets (50-100 mg total) by mouth every 6 (six) hours as needed. 45 tablet 0   capecitabine (XELODA) 500 MG tablet Take 2 tablets (1,000 mg total) by mouth 2 (two) times daily after a meal. Take for 14 days and then off for 7 days 56 tablet 6   carvedilol  (COREG ) 3.125 MG tablet Take 1 tablet (3.125 mg total) by mouth 2 (two) times daily with a meal. 180 tablet 3   cetirizine (ZYRTEC) 10 MG tablet Take 10 mg by mouth daily.     cholecalciferol (VITAMIN D3) 25 MCG (1000 UT) tablet Take 2 tablets (2,000 Units total) by mouth daily. 180 tablet 4   cyclobenzaprine  (FLEXERIL ) 10 MG tablet Take 1 tablet (10 mg total) by mouth 3 (three) times daily as needed for muscle spasms. 60 tablet 0   gemfibrozil  (LOPID )  600 MG tablet Take 1 tablet (600 mg total) by mouth 2 (two) times daily before a meal. 180 tablet 2   lidocaine -prilocaine  (EMLA ) cream Apply 1 Application topically as needed. Apply 1 dose as needed for port a cath access 30 g 5   loperamide  (IMODIUM ) 2 MG capsule Take 2 mg by mouth 4 (four) times daily as needed for diarrhea or loose stools. Reported on 11/30/2015     magic mouthwash (nystatin, lidocaine , diphenhydrAMINE , alum & mag hydroxide) suspension Take 5 mLs by mouth 4 (four) times daily as needed for mouth pain. 240 mL 2   methylPREDNISolone (MEDROL DOSEPAK) 4 MG TBPK tablet Use as directed 21 tablet 0   omeprazole  (PRILOSEC OTC) 20 MG tablet Take 20 mg by mouth daily as  needed (acid reflux).     prochlorperazine  (COMPAZINE ) 10 MG tablet Take 10 mg by mouth every 6 (six) hours as needed for nausea or vomiting.     No current facility-administered medications for this visit.    VITAL SIGNS: BP 112/77   Pulse (!) 121   Temp (!) 97.3 F (36.3 C)   Resp 20   Wt 147 lb 9.6 oz (67 kg)   SpO2 100%   BMI 24.94 kg/m  Filed Weights   06/09/24 1107  Weight: 147 lb 9.6 oz (67 kg)    Estimated body mass index is 24.94 kg/m as calculated from the following:   Height as of 06/08/24: 5' 4.5 (1.638 m).   Weight as of this encounter: 147 lb 9.6 oz (67 kg).  LABS: CBC:    Component Value Date/Time   WBC 14.3 (H) 06/08/2024 1115   WBC 10.0 02/26/2022 1123   HGB 12.6 06/08/2024 1115   HGB 14.1 07/08/2017 0942   HCT 38.8 06/08/2024 1115   HCT 42.9 07/08/2017 0942   PLT 347 06/08/2024 1115   PLT 283 07/08/2017 0942   MCV 86.4 06/08/2024 1115   MCV 81.1 07/08/2017 0942   NEUTROABS 11.2 (H) 06/08/2024 1115   NEUTROABS 5.6 07/08/2017 0942   LYMPHSABS 0.7 06/08/2024 1115   LYMPHSABS 1.2 07/08/2017 0942   MONOABS 1.3 (H) 06/08/2024 1115   MONOABS 0.7 07/08/2017 0942   EOSABS 0.9 (H) 06/08/2024 1115   EOSABS 0.1 07/08/2017 0942   BASOSABS 0.1 06/08/2024 1115   BASOSABS 0.0 07/08/2017 0942   Comprehensive Metabolic Panel:    Component Value Date/Time   NA 137 06/08/2024 1115   NA 139 07/08/2017 0942   K 3.7 06/08/2024 1115   K 3.8 07/08/2017 0942   CL 110 06/08/2024 1115   CL 109 (H) 12/31/2012 0940   CO2 17 (L) 06/08/2024 1115   CO2 18 (L) 07/08/2017 0942   BUN 17 06/08/2024 1115   BUN 17.7 07/08/2017 0942   CREATININE 0.84 06/08/2024 1115   CREATININE 1.0 07/08/2017 0942   GLUCOSE 124 (H) 06/08/2024 1115   GLUCOSE 156 (H) 07/08/2017 0942   GLUCOSE 127 (H) 12/31/2012 0940   CALCIUM  9.1 06/08/2024 1115   CALCIUM  10.4 07/08/2017 0942   AST 19 06/08/2024 1115   AST 54 (H) 07/08/2017 0942   ALT 12 06/08/2024 1115   ALT 39 07/08/2017 0942    ALKPHOS 207 (H) 06/08/2024 1115   ALKPHOS 232 (H) 07/08/2017 0942   BILITOT 0.5 06/08/2024 1115   BILITOT 0.42 07/08/2017 0942   PROT 7.5 06/08/2024 1115   PROT 7.7 07/08/2017 0942   ALBUMIN 3.7 06/08/2024 1115   ALBUMIN 3.4 (L) 07/08/2017 0942    RADIOGRAPHIC STUDIES:  No results found.  PERFORMANCE STATUS (ECOG) : 1 - Symptomatic but completely ambulatory  Review of Systems  Constitutional:  Positive for activity change, appetite change and fatigue.  Musculoskeletal:  Positive for arthralgias.  Unless otherwise noted, a complete review of systems is negative.  Physical Exam General: NAD Cardiovascular: regular rate and rhythm Pulmonary: clear ant fields Abdomen: soft, nontender, + bowel sounds Extremities: no edema, no joint deformities Skin: no rashes Neurological: Alert and oriented x3  Discussed the use of AI scribe software for clinical note transcription with the patient, who Parks verbal consent to proceed.  History of Present Illness Barbara Parks is a 73 year old female with metastatic breast cancer presents to clinic for her initial palliative visit.  No family present.  Patient is alert and able to engage appropriately in discussions.  I introduced myself, Barbara Parks, and Palliative's role in collaboration with the oncology team. Concept of Palliative Care was introduced as specialized medical care for people and their families living with serious illness.  It focuses on providing relief from the symptoms and stress of a serious illness.  The goal is to improve quality of life for both the patient and the family. Values and goals of care important to patient and family were attempted to be elicited.  Barbara Parks lives alone with her Barbara Parks. Never married. No children. Her family resides in her hometown in [place] . 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 She has a friend, Barbara Parks, who is a  retired [employer] and her emergency contact.   Patient reports she is able to perform most ADLs independently with some limitations due to the fatigue and shortness of breath.  She has persistent diarrhea, which began after starting a new medication that caused a severe allergic reaction, resulting in a rash over 90% of her body. The rash was biopsied by a dermatologist, and she is awaiting results. Despite stopping the medication, the diarrhea persists, and she manages it with Imodium , taking two in the morning and one in the afternoon as needed.  She experiences severe shortness of breath, which affects her quality of life more than any other symptom. The shortness of breath worsens with activity, such as walking her dog. She has a history of HER2 treatment, and she reports that her doctor said it caused some damage to her right lung and heart. She also has underlying COPD. We discussed further evaluating to ensure no significant exacerbations or fluid as this is quite concerning for her ability to manage certain levels of activity.   Barbara Parks has significant pain in her right shoulder, which she attributes to her cancer. The pain is localized to the shoulder and does not radiate. She limits the use of her right arm due to the pain but manages daily activities as she is left-handed.  She has stage four cancer and has been on multiple oral medications as part of her treatment. In August, she experienced increased back pain due to cancer in her vertebrae and compression fractures in her lower back. The current pain is located in the middle of her back and is described as 'horrific', differing from her previous back pain following surgery 30 years ago.  For pain management, she was prescribed Percocet, but it exacerbated her shortness of breath, so she discontinued its use. She now uses Tylenol  and occasionally ibuprofen, which she takes with food to avoid stomach upset. She also has  Flexeril  on hand for  muscle spasms, which have been severe in the past but are currently under control. Barbara Parks shares current use of OTC medication is not effective in managing her pain. We discussed use of Tramadol  as needed for moderate to severe pain. Education provided on efficacy, administration, and potential side effects. She verbalized understanding.   We discussed Barbara Parks's current illness and what it means in the larger context of her on-going co-morbidities. Natural disease trajectory and expectations were discussed.  She is realistic in her understanding of her cancer state and overall prognosis. Barbara Parks is clear in her expressed wishes to continue to treat the treatable allowing her every opportunity to thrive while managing symptoms. Her quality of life is most important to her.   I empathetically approached discussions regarding advanced directives, healthcare limitations, and code status. Patient states she has a documented directive. However she plans to review and consider any needed changes given years past of completion. Her neighbor, Barbara Parks is her primary contact in support of her sister (out of state), Barbara Parks.   I discussed the importance of continued conversation with family and their medical providers regarding overall plan of care and treatment options, ensuring decisions are within the context of the patients values and GOCs.  Assessment & Plan Established therapeutic relationship. Education provided on palliative's role in collaboration with their Oncology/Radiation team.  Metastatic cancer with bone and right shoulder involvement Stage IV metastatic cancer with bone involvement, particularly in the right shoulder, causing significant pain. Recent exacerbation of back pain, likely due to cancer involvement in the vertebrae.  - Will consider radiation therapy for right shoulder post-scan per patient discussions with Oncology  - Prescribed tramadol  every 4-6 hours as needed for  moderate to severe pain.   Shortness of breath, possible pleural effusion vs COPD exacerbation. Severe shortness of breath, possibly due to pleural effusion. Previous HER2 treatment may have caused lung damage. Chest x-ray planned to assess for fluid accumulation. If moderate fluid is present, thoracentesis may be considered to alleviate symptoms. - Ordered chest x-ray today - If chest x-ray shows significant fluid, will refer to interventional radiology for thoracentesis  Persistent diarrhea Daily diarrhea persisting despite cessation of recent medication. Managed with Imodium , which provides some relief. - Continue Imodium  as needed for diarrhea management  Allergic drug reaction with generalized rash Recent allergic reaction to medication, resulting in a generalized rash. Dermatologist biopsy pending for further evaluation. - Await biopsy results from dermatologist  Chronic back pain with history of lumbar compression fractures and prior surgery Chronic back pain exacerbated by recent activity on hard floors. Pain management with Tylenol  and ibuprofen, though limited efficacy. Flexeril  available for muscle spasms. - Continue Tylenol  and ibuprofen as needed for pain - Use Flexeril  for muscle spasms as needed  Chronic obstructive pulmonary disease (COPD) COPD with exacerbation of symptoms, particularly shortness of breath. Narcotics previously worsened symptoms. COPD not severe enough for aggressive treatment. - Avoid narcotics that exacerbate COPD symptoms - Will consider inhaler use if chest x-ray does not show significant fluid  Seasonal allergic rhinitis (fall allergies) Fall allergies managed with Zyrtec, providing relief from nasal congestion. - Continue Zyrtec for allergy management  I will plan to see patient back in 2-3 weeks. Sooner if needed.   Patient expressed understanding and was in agreement with this plan. She also understands that She can call the clinic at any time  with any questions, concerns, or complaints.   Thank you for your referral and  allowing Palliative to assist in [name]'s care.   Number and complexity of problems addressed: HIGH - 1 or more chronic illnesses with SEVERE exacerbation, progression, or side effects of treatment - advanced cancer, pain. Any controlled substances utilized were prescribed in the context of palliative care.  Visit consisted of counseling and education dealing with the complex and emotionally intense issues of symptom management and palliative care in the setting of serious and potentially life-threatening illness.  Signed by: Levon Borer, AGPCNP-BC Palliative Medicine Team/[facility]

## 2024-06-14 NOTE — Progress Notes (Unsigned)
  Cancer Center        Telephone: [phone]?Fax: [phone]   Oncology Clinical Pharmacist Practitioner Initial Assessment   Barbara Parks is a 73 y.o. female with a diagnosis of breast cancer. They were contacted today via in-person visit.  Indication/Regimen Capecitabine (Xeloda) is being used appropriately for treatment of metastatic breast cancer by Dr. Vinay Gudena.      Wt Readings from Last 1 Encounters:  06/15/24 145 lb (65.8 kg)    Estimated body surface area is 1.73 meters squared as calculated from the following:   Height as of this encounter: 5' 4.5 (1.638 m).   Weight as of this encounter: 145 lb (65.8 kg).  The dosing regimen is 2 tablets (1000 mg) by mouth in the morning and 2 tablets (1000 mg) in the evening with food on days 1 to 14 of a 21-day cycle. This is being given  in combination with denosumab  120 mg (Xgeva ). It is planned to continue until disease progression or unacceptable toxicity. Prescription dose and frequency assessed for appropriateness. The treatment goal is: Control.  Patient has agreed to treatment which is documented in physician note on 06/08/24. Counseled patient on administration, dosing, side effects, monitoring, drug-food interactions, safe handling, storage, and disposal.  Home delivery will be on 06/17/24 and Barbara Parks will start next day.  Dose Modifications Dr. Gudena is starting capecitabine at a reduced dose of ~571 mg/m2 PO BID 14 out of 21 days   Access Assessment Barbara Parks will be receiving capecitabine through [facility] Concerns: none Start date if known: 06/18/24  Adherence Assessment Reviewed importance on keeping a med schedule and plan for any missed doses Barriers to adherence identified? No  Communication and Learning Assessment Primary learner: patient Barriers to learning: No barriers Preferred language: English Learning preferences: Listening    Allergies Allergies  Allergen Reactions   Aspirin     Upset stomach   Latex Other (See Comments)    Blistering and skin peels off   Nsaids Nausea And Vomiting    Extreme nausea and vomiting    Vitals    06/15/2024    1:20 PM 06/09/2024   11:07 AM 06/08/2024   11:31 AM  Oncology Vitals  Height 164 cm  164 cm  Weight 65.772 kg 66.951 kg 65.908 kg  Weight (lbs) 145 lbs 147 lbs 10 oz 145 lbs 5 oz  BMI 24.5 kg/m2 24.94 kg/m2 24.56 kg/m2  Temp 98.6 F (37 C) 97.3 F (36.3 C) 98.3 F (36.8 C)  Pulse Rate 107 121 117  BP 95/58 112/77 100/83  Resp 18 20 20   SpO2 97 % 100 % 100 %  BSA (m2) 1.73 m2 1.75 m2 1.73 m2     Laboratory Data    Latest Ref Rng & Units 06/08/2024   11:15 AM 05/10/2024   11:28 AM 04/26/2024    9:15 AM  CBC EXTENDED  WBC 4.0 - 10.5 K/uL 14.3  11.8  16.5   RBC 3.87 - 5.11 MIL/uL 4.49  5.57  5.31   Hemoglobin 12.0 - 15.0 g/dL 87.3  84.6  85.3   HCT 36.0 - 46.0 % 38.8  48.3  46.0   Platelets 150 - 400 K/uL 347  442  326   NEUT# 1.7 - 7.7 K/uL 11.2  9.6  13.9   Lymph# 0.7 - 4.0 K/uL 0.7  0.8  0.7        Latest Ref Rng & Units 06/08/2024  11:15 AM 05/10/2024   11:28 AM 04/26/2024    9:15 AM  CMP  Glucose 70 - 99 mg/dL 875  897  879   BUN 8 - 23 mg/dL 17  17  25    Creatinine 0.44 - 1.00 mg/dL 9.15  8.96  8.84   Sodium 135 - 145 mmol/L 137  138  138   Potassium 3.5 - 5.1 mmol/L 3.7  4.4  3.9   Chloride 98 - 111 mmol/L 110  110  107   CO2 22 - 32 mmol/L 17  18  19    Calcium  8.9 - 10.3 mg/dL 9.1  9.9  9.2   Total Protein 6.5 - 8.1 g/dL 7.5  8.8  8.7   Total Bilirubin 0.0 - 1.2 mg/dL 0.5  0.4  0.8   Alkaline Phos 38 - 126 U/L 207  200  181   AST 15 - 41 U/L 19  19  25    ALT 0 - 44 U/L 12  9  11     Contraindications Contraindications were reviewed? Yes Contraindications to therapy were identified? No   Safety Precautions The following safety precautions for the use of capecitabine were reviewed:  Fever: reviewed the importance of having a  thermometer and the Centers for Disease Control and Prevention (CDC) definition of fever which is 100.13F (38C) or higher. Patient should call 24/7 triage at [phone] if experiencing a fever or any other symptoms Decreased white blood cells (WBCs) and increased risk for infection Decreased hemoglobin, part of the red blood cells that carry iron and oxygen Diarrhea Mouth irritation or sores Pain or discomfort in hands and/or feet Changes in liver function Nausea or vomiting Fatigue Abdominal pain Decreased platelet count and increased risk of bleeding Interactions with warfarin Cardiotoxicity Kidney injury Dehydration Alopecia Handling body fluids and waste Pregnancy, sexual activity, and contraception Storage and Handling To be taken within 30 minutes of a meal Missed doses  Medication Reconciliation Current Outpatient Medications  Medication Sig Dispense Refill   carvedilol  (COREG ) 3.125 MG tablet Take 1 tablet (3.125 mg total) by mouth 2 (two) times daily with a meal. 180 tablet 3   cetirizine (ZYRTEC) 10 MG tablet Take 10 mg by mouth daily.     cholecalciferol (VITAMIN D3) 25 MCG (1000 UT) tablet Take 2 tablets (2,000 Units total) by mouth daily. 180 tablet 4   cyclobenzaprine  (FLEXERIL ) 10 MG tablet Take 1 tablet (10 mg total) by mouth 3 (three) times daily as needed for muscle spasms. 60 tablet 0   gemfibrozil  (LOPID ) 600 MG tablet Take 1 tablet (600 mg total) by mouth 2 (two) times daily before a meal. 180 tablet 2   capecitabine (XELODA) 500 MG tablet Take 2 tablets (1,000 mg total) by mouth 2 (two) times daily after a meal. Take for 14 days and then off for 7 days (Patient not taking: Reported on 06/15/2024) 56 tablet 6   lidocaine -prilocaine  (EMLA ) cream Apply 1 Application topically as needed. Apply 1 dose as needed for port a cath access 30 g 5   loperamide  (IMODIUM ) 2 MG capsule Take 2 mg by mouth 4 (four) times daily as needed for diarrhea or loose stools. Reported  on 11/30/2015     prochlorperazine  (COMPAZINE ) 10 MG tablet Take 10 mg by mouth every 6 (six) hours as needed for nausea or vomiting.     traMADol  (ULTRAM ) 50 MG tablet Take 1-2 tablets (50-100 mg total) by mouth every 6 (six) hours as needed. 45 tablet 0  No current facility-administered medications for this visit.   Medication reconciliation is based on the patient's most recent medication list in the electronic medical record (EMR) including herbal products and OTC medications.   The patient's medication list was reviewed today with the patient? Yes   Drug-drug interactions (DDIs) DDIs were evaluated? Yes Significant DDIs identified? No   Drug-Food Interactions Drug-food interactions were evaluated? Yes Drug-food interactions identified? No   Follow-up Plan  Patient education handout given to patient Start capecitabine 2 tablets (1000 mg) by mouth in the morning and 2 tablets (1000 mg) in the evening with food on days 1 to 14 of a 21-day cycle. She will start on 06/18/24 Continue denosumab  120 mg SubQ every 12 weeks. Patient received denosumab  on 06/08/24 and will be due again in 12 weeks (09/01/24).  Monitor for side effects, especially her breathing. Knows to go to ED if urgent Can try diclofenac gel if tolerated for prevention of HFS. Reviewed instructions today. Distress thermometer not completed as done in previous lines of therapy (04/08/24) Visit with Dr. Gudena on 06/22/24 will be phone visit to review scans Will add port labs and Dr. Odean visit week of 07/05/24 Will add port labs, pharmacy clinic visit in 7 weeks Barbara Parks can follow up with clinical pharmacy as deemed necessary by Dr. Mackey Odean going forward   Barbara Parks participated in the discussion, expressed understanding, and voiced agreement with the above plan. All questions were answered to her satisfaction. The patient was advised to contact the clinic at [phone] with any questions or  concerns prior to her return visit.   I spent 60 minutes assessing the patient.  Harace Mccluney A. Parks, PharmD, BCOP, CPP  Barbara Parks, RPH-CPP, 06/15/2024 1:41 PM  **Disclaimer: This note was dictated with voice recognition software. Similar sounding words can inadvertently be transcribed and this note may contain transcription errors which may not have been corrected upon publication of note.**

## 2024-06-15 ENCOUNTER — Other Ambulatory Visit: Payer: Self-pay

## 2024-06-15 ENCOUNTER — Encounter: Payer: Self-pay | Admitting: Hematology and Oncology

## 2024-06-15 ENCOUNTER — Telehealth: Payer: Self-pay

## 2024-06-15 ENCOUNTER — Inpatient Hospital Stay: Admitting: Pharmacist

## 2024-06-15 ENCOUNTER — Other Ambulatory Visit (HOSPITAL_COMMUNITY): Payer: Self-pay

## 2024-06-15 VITALS — BP 95/58 | HR 107 | Temp 98.6°F | Resp 18 | Ht 64.5 in | Wt 145.0 lb

## 2024-06-15 DIAGNOSIS — C7801 Secondary malignant neoplasm of right lung: Secondary | ICD-10-CM | POA: Diagnosis not present

## 2024-06-15 DIAGNOSIS — C7951 Secondary malignant neoplasm of bone: Secondary | ICD-10-CM

## 2024-06-15 DIAGNOSIS — C787 Secondary malignant neoplasm of liver and intrahepatic bile duct: Secondary | ICD-10-CM | POA: Diagnosis not present

## 2024-06-15 DIAGNOSIS — C7802 Secondary malignant neoplasm of left lung: Secondary | ICD-10-CM | POA: Diagnosis not present

## 2024-06-15 DIAGNOSIS — C50411 Malignant neoplasm of upper-outer quadrant of right female breast: Secondary | ICD-10-CM | POA: Diagnosis not present

## 2024-06-15 DIAGNOSIS — Z5111 Encounter for antineoplastic chemotherapy: Secondary | ICD-10-CM | POA: Diagnosis not present

## 2024-06-15 NOTE — Progress Notes (Signed)
 Specialty Pharmacy Initial Fill Coordination Note  Barbara Parks is a 73 y.o. female contacted today regarding refills of specialty medication(s) Capecitabine  (XELODA ) .  Patient requested Delivery  on 06/17/24  to verified address [address] [place] [address]   Medication will be filled on 06/16/24.   Patient is aware of $44.72 copayment.

## 2024-06-15 NOTE — Telephone Encounter (Signed)
 I contacted [place] to discontinue PAP assistance  for the prescription :  Barbara Parks,  CPhT-Adv  she/her/hers [facility]  [facility] Pharmacy Technician [facility] Phone: [phone]  Fax: [phone] [email or web address]

## 2024-06-15 NOTE — Progress Notes (Signed)
 Patient counseled in clinic in-person visit note on 06/15/24

## 2024-06-16 ENCOUNTER — Other Ambulatory Visit: Payer: Self-pay

## 2024-06-17 ENCOUNTER — Ambulatory Visit (HOSPITAL_COMMUNITY)
Admission: RE | Admit: 2024-06-17 | Discharge: 2024-06-17 | Disposition: A | Discharge status: Home / Self Care | Source: Ambulatory Visit | Attending: Hematology and Oncology | Admitting: Hematology and Oncology

## 2024-06-17 DIAGNOSIS — R918 Other nonspecific abnormal finding of lung field: Secondary | ICD-10-CM | POA: Diagnosis not present

## 2024-06-17 DIAGNOSIS — C7800 Secondary malignant neoplasm of unspecified lung: Secondary | ICD-10-CM | POA: Insufficient documentation

## 2024-06-17 DIAGNOSIS — C50911 Malignant neoplasm of unspecified site of right female breast: Secondary | ICD-10-CM | POA: Insufficient documentation

## 2024-06-17 DIAGNOSIS — C7951 Secondary malignant neoplasm of bone: Secondary | ICD-10-CM | POA: Diagnosis not present

## 2024-06-17 DIAGNOSIS — C787 Secondary malignant neoplasm of liver and intrahepatic bile duct: Secondary | ICD-10-CM | POA: Diagnosis not present

## 2024-06-17 DIAGNOSIS — J90 Pleural effusion, not elsewhere classified: Secondary | ICD-10-CM | POA: Diagnosis not present

## 2024-06-17 MED ORDER — IOHEXOL 300 MG/ML  SOLN
100.0000 mL | Freq: Once | INTRAMUSCULAR | Status: AC | PRN
  Administered 2024-06-17: 100 mL via INTRAVENOUS

## 2024-06-22 ENCOUNTER — Encounter: Payer: Self-pay | Admitting: Nurse Practitioner

## 2024-06-22 ENCOUNTER — Inpatient Hospital Stay: Admitting: Nurse Practitioner

## 2024-06-22 ENCOUNTER — Other Ambulatory Visit: Payer: Self-pay | Admitting: *Deleted

## 2024-06-22 ENCOUNTER — Inpatient Hospital Stay: Admitting: Hematology and Oncology

## 2024-06-22 ENCOUNTER — Inpatient Hospital Stay (HOSPITAL_BASED_OUTPATIENT_CLINIC_OR_DEPARTMENT_OTHER): Admitting: Hematology and Oncology

## 2024-06-22 VITALS — BP 105/56 | HR 104 | Temp 98.2°F | Resp 18 | Wt 144.0 lb

## 2024-06-22 DIAGNOSIS — C50411 Malignant neoplasm of upper-outer quadrant of right female breast: Secondary | ICD-10-CM | POA: Diagnosis not present

## 2024-06-22 DIAGNOSIS — Z515 Encounter for palliative care: Secondary | ICD-10-CM | POA: Diagnosis not present

## 2024-06-22 DIAGNOSIS — C7800 Secondary malignant neoplasm of unspecified lung: Secondary | ICD-10-CM

## 2024-06-22 DIAGNOSIS — R630 Anorexia: Secondary | ICD-10-CM

## 2024-06-22 DIAGNOSIS — R0602 Shortness of breath: Secondary | ICD-10-CM | POA: Diagnosis not present

## 2024-06-22 DIAGNOSIS — R530 Neoplastic (malignant) related fatigue: Secondary | ICD-10-CM | POA: Diagnosis not present

## 2024-06-22 DIAGNOSIS — R110 Nausea: Secondary | ICD-10-CM

## 2024-06-22 DIAGNOSIS — C7951 Secondary malignant neoplasm of bone: Secondary | ICD-10-CM | POA: Diagnosis not present

## 2024-06-22 DIAGNOSIS — C7801 Secondary malignant neoplasm of right lung: Secondary | ICD-10-CM | POA: Diagnosis not present

## 2024-06-22 DIAGNOSIS — C50911 Malignant neoplasm of unspecified site of right female breast: Secondary | ICD-10-CM

## 2024-06-22 DIAGNOSIS — C787 Secondary malignant neoplasm of liver and intrahepatic bile duct: Secondary | ICD-10-CM

## 2024-06-22 DIAGNOSIS — C7802 Secondary malignant neoplasm of left lung: Secondary | ICD-10-CM | POA: Diagnosis not present

## 2024-06-22 DIAGNOSIS — R634 Abnormal weight loss: Secondary | ICD-10-CM | POA: Diagnosis not present

## 2024-06-22 DIAGNOSIS — Z5111 Encounter for antineoplastic chemotherapy: Secondary | ICD-10-CM | POA: Diagnosis not present

## 2024-06-22 MED ORDER — ALBUTEROL SULFATE HFA 108 (90 BASE) MCG/ACT IN AERS: 2.0000 | INHALATION_SPRAY | Freq: Four times a day (QID) | RESPIRATORY_TRACT | 6 refills | Status: DC | PRN

## 2024-06-22 MED ORDER — PROCHLORPERAZINE MALEATE 10 MG PO TABS: 10.0000 mg | ORAL_TABLET | Freq: Four times a day (QID) | ORAL | 3 refills | Status: AC | PRN

## 2024-06-22 NOTE — Assessment & Plan Note (Signed)
 Metastatic breast cancer (pericardial effusion, pleural effusion, right middle lobe lung lesion, ER 91%, PR 90%, Ki-67 35%, HER2 negative diagnosed February 2023)   (Prior history of right mastectomy 2005, adjuvant chemo with Adriamycin Cytoxan and Taxol followed by tamoxifen)   Current treatment:  1. Ribociclib , fulvestrant , Xgeva , ribociclib  discontinued 10/18/2021  2. abemaciclib  started 10/27/2021 3. Enhertu  discontinued for progression after 16 cycles on 06/06/2023 4.  Elacestrant  06/18/2023-03/16/2024 5. Capivasertib  started 04/13/2024-05/15/2024 discontinued for rash and diarrhea    Foundation 1 in 2021: MSI stable, TMB 1, ESR 1 mutation, PTEN, FGF 10, RICTOR amplifications   Guardant360 01/14/2022: No actionable mutations.  ESR 1 mutation. Liver biopsy 02/26/2022: Metastatic carcinoma consistent with breast primary. Caris mutation testing 02/26/2022: HER2 low, ESR 1 mutation, PTEN She has a puppy named [name]).    01/23/2024: CT CAP: Moderate loculated right pleural effusion, stable liver lesions, stable bone metastasis 03/05/2024: MRI liver: Mixed response to hepatic metastases some are smaller, few new lesions, mild progression of bone metastases    CA 27-29: 356 on 03/01/2024 (was 149 on 09/15/2023) Port Blood clot: Currently on anticoagulation with Eliquis    Current treatment:  Xeloda  1000 mg p.o. twice daily 14 days on 7 days off started 06/18/2024 Xeloda  toxicities:  Return to clinic in 1 month with labs and follow-up  06/17/2024: CT CAP: Marked progression of hepatic metastases.  Innumerable new lesions.  Index right hepatic lobe lesion 2.9 cm, new pulmonary nodules, widespread bone metastases.  Left adrenal adenoma

## 2024-06-22 NOTE — Progress Notes (Signed)
 Per MD pt needing Thoracentesis.  Orders placed, RN attempt x1 to contact pt to set up.  No answer, LVM with central scheduling number.

## 2024-06-22 NOTE — Progress Notes (Signed)
 Palliative Medicine [facility]  [phone] [phone]   Name: [name] [facility] Date: 06/22/2024 MRN: [id number]  DOB: April 23, 1951  Patient Care Team: Dyane Anthony RAMAN, FNP as PCP - General (Family Medicine) Alvan Ronal BRAVO, MD (Inactive) as PCP - Cardiology (Cardiology) Jeffrie Oneil BROCKS, MD (Cardiology) Lucas Dorise POUR, MD (Cardiothoracic Surgery) Lucila Norleen LABOR, RPH-CPP (Pharmacist) Dannielle Alm BRAVO, MD as Referring Physician (Radiation Oncology) Odean Potts, MD as Consulting Physician (Hematology and Oncology) Pickenpack-Cousar, Fannie SAILOR, NP as Nurse Practitioner (Hospice and Palliative Medicine)    INTERVAL HISTORY: [name] is a 73 y.o. female with oncologic medical history including metastatic breast cancer (07/2019) with liver involvement.  Palliative is seeing patient for symptom management and goals of care.   SOCIAL HISTORY:     reports that she quit smoking about 16 years ago. Her smoking use included cigarettes. She started smoking about 46 years ago. She has a 45 pack-year smoking history. She has never used smokeless tobacco. She reports that she does not drink alcohol and does not use drugs.  ADVANCE DIRECTIVES:  None on file. Patient reports she has a documented advanced directive. States her close friend/neighbor Merlynn Kerns is her primary medical decision-maker and support of her sister, Meade Gave who lives out of state.   CODE STATUS: Full code  PAST MEDICAL HISTORY: Past Medical History:  Diagnosis Date   Arthritis    left hip   Asthma    breast ca 2005   breast/chemo R mastectomy   Breast cancer (HCC)    Dermatitis    Diabetes mellitus without complication (HCC)    GERD (gastroesophageal reflux disease)    Hypercholesteremia    Metastasis to lung (HCC) dx'd 08/2011   Osteopenia due to cancer therapy 09/09/2013   Palpitations    Personal history of chemotherapy    Shortness of breath      ALLERGIES:  is allergic to aspirin, latex, and nsaids.  MEDICATIONS:  Current Outpatient Medications  Medication Sig Dispense Refill   albuterol  (VENTOLIN  HFA) 108 (90 Base) MCG/ACT inhaler Inhale 2 puffs into the lungs every 6 (six) hours as needed for wheezing or shortness of breath. 8 g 6   capecitabine  (XELODA ) 500 MG tablet Take 2 tablets (1,000 mg total) by mouth 2 (two) times daily after a meal. Take for 14 days and then off for 7 days 56 tablet 6   carvedilol  (COREG ) 3.125 MG tablet Take 1 tablet (3.125 mg total) by mouth 2 (two) times daily with a meal. 180 tablet 3   cetirizine (ZYRTEC) 10 MG tablet Take 10 mg by mouth daily.     cholecalciferol (VITAMIN D3) 25 MCG (1000 UT) tablet Take 2 tablets (2,000 Units total) by mouth daily. 180 tablet 4   cyclobenzaprine  (FLEXERIL ) 10 MG tablet Take 1 tablet (10 mg total) by mouth 3 (three) times daily as needed for muscle spasms. 60 tablet 0   gemfibrozil  (LOPID ) 600 MG tablet Take 1 tablet (600 mg total) by mouth 2 (two) times daily before a meal. 180 tablet 2   lidocaine -prilocaine  (EMLA ) cream Apply 1 Application topically as needed. Apply 1 dose as needed for port a cath access 30 g 5   loperamide  (IMODIUM ) 2 MG capsule Take 2 mg by mouth 4 (four) times daily as needed for diarrhea or loose stools. Reported on 11/30/2015     prochlorperazine  (COMPAZINE ) 10 MG tablet Take 1 tablet (10 mg total) by mouth every 6 (six)  hours as needed for nausea or vomiting. 30 tablet 3   traMADol  (ULTRAM ) 50 MG tablet Take 1-2 tablets (50-100 mg total) by mouth every 6 (six) hours as needed. 45 tablet 0   No current facility-administered medications for this visit.    VITAL SIGNS: BP (!) 105/56 (BP Location: Left Arm, Patient Position: Sitting, Cuff Size: Normal)   Pulse (!) 104   Temp 98.2 F (36.8 C) (Temporal)   Resp 18   Wt 144 lb (65.3 kg)   SpO2 96%   BMI 24.34 kg/m  Filed Weights   06/22/24 1519  Weight: 144 lb (65.3 kg)    Estimated  body mass index is 24.34 kg/m as calculated from the following:   Height as of 06/15/24: 5' 4.5 (1.638 m).   Weight as of this encounter: 144 lb (65.3 kg).   PERFORMANCE STATUS (ECOG) : 2 - Symptomatic, <50% confined to bed  Physical Exam General: NAD Cardiovascular: Tachycardic Pulmonary: dyspneic, coarse  Extremities: no edema, no joint deformities Skin: no rashes Neurological: AAO x3  IMPRESSION: Discussed the use of AI scribe software for clinical note transcription with the patient, who gave verbal consent to proceed.  History of Present Illness Barbara Parks is a 73 year old female with metastatic breast cancer who presents to clinic for follow-up. She is complaining of significant shortness of breath and fatigue. Dyspneic with minimal exertion. She is in a wheelchair due to her shortness of breath. This is a change in her previous episodes stating 'much worse and worrisome. She is anxiously awaiting results of CT scan on yesterday. She is seeing Dr. Gudena today after our visit.   She experiences significant shortness of breath that limits her ability to walk any distance or perform household activities. She becomes extremely short of breath with exertion, such as vacuuming her small living room, which took her two hours to complete. She also notes a recent development of a bad cough over the weekend. No chest pain, but she reports chest tightness, describing it as if she has stuff in there and it won't come up. She has been sleeping in a recliner for eight years, which she finds helps her breathe easier. She expresses difficulty in getting enough oxygen. Does not desire to have to maintain quality of life by sitting around and relying on sleeping upright.   Discussed most likely in need of further evaluation and interventions such as thoracentesis. Given she is scheduled to see her oncologist today will defer interventions and next steps.   Ms. Behnke reports a decrease in  appetite, stating that she has been making smaller helpings of food and often does not want more after finishing. Her weight has decreased from 147 pounds on November 12th to 144 pounds today. We discussed continuing to focus on small frequent meals and snacking as desired. Also to include high protein foods.   Blood pressure is on the softer side today at 105/56. HR 104. She attributes this to medication.   I will plan to continue close follow-up and offer support in collaboration to oncology team. All questions answered and support provided.   Assessment & Plan Dyspnea and fatigue with exertion Severe dyspnea and fatigue with minimal exertion, significantly limiting daily activities. Shortness of breath occurs with both long and short distances, and even simple tasks like vacuuming are exhausting. Awaiting CT scan results to assess for potential fluid accumulation or other causes. Will defer interventions to Oncology.  - Await CT scan results from Dr.  Gudena. Patient is seeing him today.  - Coordinate care based on CT scan findings as needed.   Cough Developed a severe cough over the weekend, contributing to respiratory discomfort.  Decreased appetite and weight loss Decreased appetite with weight loss from 147 lbs on November 12th to 144 lbs currently. - Continue current dietary management with smaller portions and frequent meals.  -High protein intake and supplements.   Nausea, managed with Compazine  Nausea is managed with Compazine , and she requires a refill. - Sent Compazine  refill to pharmacy  -I will plan to see patient back in 1-2 weeks. I will give her a call next week.   Patient expressed understanding and was in agreement with this plan. She also understands that She can call the clinic at any time with any questions, concerns, or complaints.   Any controlled substances utilized were prescribed in the context of palliative care. PDMP has been reviewed.   Visit consisted of  counseling and education dealing with the complex and emotionally intense issues of symptom management and palliative care in the setting of serious and potentially life-threatening illness.  Levon Borer, AGPCNP-BC  Palliative Medicine Team/[facility]

## 2024-06-22 NOTE — Progress Notes (Signed)
 "  Patient Care Team: Dyane Anthony RAMAN, FNP as PCP - General (Family Medicine) Alvan Ronal BRAVO, MD (Inactive) as PCP - Cardiology (Cardiology) Jeffrie Oneil BROCKS, MD (Cardiology) Lucas Dorise POUR, MD (Cardiothoracic Surgery) Lucila Norleen LABOR, RPH-CPP (Pharmacist) Dannielle Alm BRAVO, MD as Referring Physician (Radiation Oncology) Odean Potts, MD as Consulting Physician (Hematology and Oncology) Pickenpack-Cousar, Fannie SAILOR, NP as Nurse Practitioner [facility])  DIAGNOSIS:  Encounter Diagnosis  Name Primary?   Carcinoma of right breast metastatic to lung (HCC) Yes    SUMMARY OF ONCOLOGIC HISTORY: Oncology History  Carcinoma of right breast metastatic to lung (HCC)  10/10/2011 Initial Diagnosis   Carcinoma of right breast metastatic to lung (HCC)   05/02/2022 - 06/06/2023 Chemotherapy   Patient is on Treatment Plan : BREAST METASTATIC Fam-Trastuzumab Deruxtecan-nxki  (Enhertu ) (5.4) q21d     Malignant neoplasm of upper-outer quadrant of right breast in female, estrogen receptor positive (HCC)  03/22/2004 Initial Diagnosis   right mastectomy and sentinel lymph node sampling 03/22/2004 for a right upper-outer quadrant pT2 pN1, stage IIB invasive ductal carcinoma with lobular features, grade 2, estrogen receptor and progesterone receptor positive, HER-2 negative, with an MIB-1 of 9%   05/2004 - 09/18/2004 Chemotherapy   adjuvant chemotherapy with dose dense doxorubicin and cyclophosphamide x4 cycles (first cycle delayed one week) followed by dose dense paclitaxel x4 was completed 09/18/2004   09/2004 - 2009 Anti-estrogen oral therapy   tamoxifen started March 2006, discontinued 2009   08/2011 Relapse/Recurrence   Metastatic disease:large pericardial effusion, large right pleural effusion and possible right middle lobe bronchial obstruction  status post pericardial window placement, fiberoptic bronchoscopy and right Pleurx placement 09/11/2011, with biopsy of the bronchus  intermedius and pericardium positive for metastatic breast cancer, ER 91%, PR 100% p with an MIB-1 of 35%, and no HER-2 amplification, ratio being 1.37   10/10/2011 - 07/2019 Anti-estrogen oral therapy   BOLERO-4 trial 10/10/2011, receiving letrozole  and everolimus  (stopped for progression)   08/16/2019 Miscellaneous   foundation 1 requested on liver biopsy from 08/16/2019 shows a stable microsatellite status with 1 mutation/MB, amplification of [id number], FGF10 and RICTOR, with mutations in ESR1 and PTEN   08/16/2019 Miscellaneous   Liver biopsy: Metastatic carcinoma to the liver consistent with breast primary, ER 100%, PR 100%, Ki-67 5%, HER2 2+ by IHC, FISH HER2 negative   08/24/2019 -  Anti-estrogen oral therapy   fulvestrant  started 08/24/2019, repeated every 4 weeks Palbociclib  125 mg daily 21 days on 7 days off started 08/25/2019 Switched to ribociclib  s starting 08/22/2020   02/28/2021 -  Radiation Therapy   palliative radiation (Dr. Dannielle in [place]) 02/28/2021 to right hip area   10/27/2021 -  Anti-estrogen oral therapy   Abemaciclib  with fulvestrant    01/14/2022 Miscellaneous   Guardant360: ESR 1 mutation (no other mutations) MSI high was not detected, PTEN, T p53, EGFR amplification, RB1 mutations detected without any approved treatment options   Malignant neoplasm metastatic to liver (HCC)  08/09/2019 Initial Diagnosis   Malignant neoplasm metastatic to liver (HCC)     CHIEF COMPLIANT: Follow-up of metastatic breast cancer  HISTORY OF PRESENT ILLNESS:  History of Present Illness Barbara Parks is a 73 year old female with metastatic cancer who presents with worsening breathing difficulties.  She reports worsening shortness of breath, which she links to fluid in her lungs. She had a bad cough over the weekend that has improved. She feels weak and short of breath, now needs a wheelchair for longer distances.  She started Xeloda  (capecitabine ) four days ago. She has no  diarrhea but has a markedly decreased appetite and is compensating with small frequent meals. She has no significant pain, but breathing remains her main concern.  Recent imaging shows marked progression of metastatic disease with bilateral liver metastases, new lung nodules including a largest liver lesion of 2.9 cm, and bone involvement.  She has lived independently for 18 years but is now struggling with self-care responsibilities and is considering rehoming her dog due to functional decline. She describes strong support from her brother.     ALLERGIES:  is allergic to aspirin, latex, and nsaids.  MEDICATIONS:  Current Outpatient Medications  Medication Sig Dispense Refill   albuterol  (VENTOLIN  HFA) 108 (90 Base) MCG/ACT inhaler Inhale 2 puffs into the lungs every 6 (six) hours as needed for wheezing or shortness of breath. 8 g 6   capecitabine  (XELODA ) 500 MG tablet Take 2 tablets (1,000 mg total) by mouth 2 (two) times daily after a meal. Take for 14 days and then off for 7 days 56 tablet 6   carvedilol  (COREG ) 3.125 MG tablet Take 1 tablet (3.125 mg total) by mouth 2 (two) times daily with a meal. 180 tablet 3   cetirizine (ZYRTEC) 10 MG tablet Take 10 mg by mouth daily.     cholecalciferol (VITAMIN D3) 25 MCG (1000 UT) tablet Take 2 tablets (2,000 Units total) by mouth daily. 180 tablet 4   cyclobenzaprine  (FLEXERIL ) 10 MG tablet Take 1 tablet (10 mg total) by mouth 3 (three) times daily as needed for muscle spasms. 60 tablet 0   gemfibrozil  (LOPID ) 600 MG tablet Take 1 tablet (600 mg total) by mouth 2 (two) times daily before a meal. 180 tablet 2   lidocaine -prilocaine  (EMLA ) cream Apply 1 Application topically as needed. Apply 1 dose as needed for port a cath access 30 g 5   loperamide  (IMODIUM ) 2 MG capsule Take 2 mg by mouth 4 (four) times daily as needed for diarrhea or loose stools. Reported on 11/30/2015     prochlorperazine  (COMPAZINE ) 10 MG tablet Take 1 tablet (10 mg total) by  mouth every 6 (six) hours as needed for nausea or vomiting. 30 tablet 3   traMADol  (ULTRAM ) 50 MG tablet Take 1-2 tablets (50-100 mg total) by mouth every 6 (six) hours as needed. 45 tablet 0   No current facility-administered medications for this visit.    PHYSICAL EXAMINATION: ECOG PERFORMANCE STATUS: 1 - Symptomatic but completely ambulatory  There were no vitals filed for this visit. There were no vitals filed for this visit.  Physical Exam Shortness of breath 2-minute exertion  (exam performed in the presence of a chaperone)  LABORATORY DATA:  I have reviewed the data as listed    Latest Ref Rng & Units 06/08/2024   11:15 AM 05/10/2024   11:28 AM 04/26/2024    9:15 AM  CMP  Glucose 70 - 99 mg/dL 875  897  879   BUN 8 - 23 mg/dL 17  17  25    Creatinine 0.44 - 1.00 mg/dL 9.15  8.96  8.84   Sodium 135 - 145 mmol/L 137  138  138   Potassium 3.5 - 5.1 mmol/L 3.7  4.4  3.9   Chloride 98 - 111 mmol/L 110  110  107   CO2 22 - 32 mmol/L 17  18  19    Calcium  8.9 - 10.3 mg/dL 9.1  9.9  9.2   Total Protein 6.5 - 8.1  g/dL 7.5  8.8  8.7   Total Bilirubin 0.0 - 1.2 mg/dL 0.5  0.4  0.8   Alkaline Phos 38 - 126 U/L 207  200  181   AST 15 - 41 U/L 19  19  25    ALT 0 - 44 U/L 12  9  11      Lab Results  Component Value Date   WBC 14.3 (H) 06/08/2024   HGB 12.6 06/08/2024   HCT 38.8 06/08/2024   MCV 86.4 06/08/2024   PLT 347 06/08/2024   NEUTROABS 11.2 (H) 06/08/2024    ASSESSMENT & PLAN:  Carcinoma of right breast metastatic to lung [facility]) Metastatic breast cancer (pericardial effusion, pleural effusion, right middle lobe lung lesion, ER 91%, PR 90%, Ki-67 35%, HER2 negative diagnosed February 2023)   (Prior history of right mastectomy 2005, adjuvant chemo with Adriamycin Cytoxan and Taxol followed by tamoxifen)   Current treatment:  1. Ribociclib , fulvestrant , Xgeva , ribociclib  discontinued 10/18/2021  2. abemaciclib  started 10/27/2021 3. Enhertu  discontinued for progression  after 16 cycles on 06/06/2023 4.  Elacestrant  06/18/2023-03/16/2024 5. Capivasertib  started 04/13/2024-05/15/2024 discontinued for rash and diarrhea    Foundation 1 in 2021: MSI stable, TMB 1, ESR 1 mutation, PTEN, FGF 10, RICTOR amplifications   Guardant360 01/14/2022: No actionable mutations.  ESR 1 mutation. Liver biopsy 02/26/2022: Metastatic carcinoma consistent with breast primary. Caris mutation testing 02/26/2022: HER2 low, ESR 1 mutation, PTEN She has a puppy named [name]).    01/23/2024: CT CAP: Moderate loculated right pleural effusion, stable liver lesions, stable bone metastasis 03/05/2024: MRI liver: Mixed response to hepatic metastases some are smaller, few new lesions, mild progression of bone metastases    CA 27-29: 356 on 03/01/2024 (was 149 on 09/15/2023) Port Blood clot: Currently on anticoagulation with Eliquis    Current treatment:  Xeloda  1000 mg p.o. twice daily 14 days on 7 days off started 06/18/2024 Xeloda  toxicities: Tolerating extremely well without any problems or concerns   06/17/2024: CT CAP: Marked progression of hepatic metastases.  Innumerable new lesions.  Index right hepatic lobe lesion 2.9 cm, new pulmonary nodules, widespread bone metastases.  Left adrenal adenoma  Shortness of breath: Will request thoracentesis as well as I sent a prescription for an inhaler. I discussed with her about getting her living will and power of attorney finalized.  She will also make sure that she has all her affairs in order.  Her biggest concern is regarding her pet dog as to who will take care of him  Return to clinic in 1 month with labs and follow-up ------------------------------------- Assessment and Plan Assessment & Plan Metastatic right breast carcinoma with liver, lung, and bone metastases Significant progression of metastatic disease with marked progression of bilateral liver metastasis, innumerable new lesions, and new lung nodules. Concerns about potential  resistance to Xeloda  as treatment progresses. - Continue Capecitabine  (Xeloda ) 500 MG oral bid. - Discussed potential need for future treatments if Xeloda  becomes ineffective. - Encouraged preparation of advance directives and will.  Malignant pleural effusion due to metastatic cancer Presence of malignant pleural effusion with approximately 800-900 mL of fluid, contributing to dyspnea. Potential for recurrence after drainage. - Requested radiology to schedule pleural fluid drainage. - Discussed potential need for pleurX catheter if fluid recurs frequently.  Dyspnea due to malignant pleural effusion and metastatic disease Dyspnea primarily due to pleural effusion and metastatic disease. - Prescribed inhalers for use every 4-6 hours as needed. - Arranged for pleural fluid drainage to alleviate dyspnea.  Anorexia secondary to cancer and chemotherapy Decreased appetite likely secondary to cancer and chemotherapy. - Encouraged small, frequent meals to manage appetite.  Fatigue and weakness secondary to advanced cancer Fatigue and weakness likely due to advanced cancer and treatment effects. - Discussed potential for palliative care support.      No orders of the defined types were placed in this encounter.  The patient has a good understanding of the overall plan. she agrees with it. she will call with any problems that may develop before the next visit here.  I personally spent a total of 30 minutes in the care of the patient today including preparing to see the patient, getting/reviewing separately obtained history, performing a medically appropriate exam/evaluation, counseling and educating, placing orders, referring and communicating with other health care professionals, documenting clinical information in the EHR, independently interpreting results, communicating results, and coordinating care.   Viinay K Jamar Weatherall, MD 06/23/24    "

## 2024-06-23 ENCOUNTER — Encounter: Payer: Self-pay | Admitting: Hematology and Oncology

## 2024-06-30 ENCOUNTER — Emergency Department (HOSPITAL_COMMUNITY)
Admission: EM | Admit: 2024-06-30 | Discharge: 2024-06-30 | Disposition: A | Discharge status: Home / Self Care | Attending: Emergency Medicine | Admitting: Emergency Medicine

## 2024-06-30 ENCOUNTER — Other Ambulatory Visit: Payer: Self-pay

## 2024-06-30 ENCOUNTER — Telehealth: Payer: Self-pay

## 2024-06-30 ENCOUNTER — Ambulatory Visit (HOSPITAL_COMMUNITY)

## 2024-06-30 ENCOUNTER — Emergency Department (HOSPITAL_COMMUNITY)

## 2024-06-30 ENCOUNTER — Encounter (HOSPITAL_COMMUNITY): Payer: Self-pay

## 2024-06-30 DIAGNOSIS — Z91040 Latex allergy status: Secondary | ICD-10-CM | POA: Diagnosis not present

## 2024-06-30 DIAGNOSIS — K769 Liver disease, unspecified: Secondary | ICD-10-CM | POA: Diagnosis not present

## 2024-06-30 DIAGNOSIS — C799 Secondary malignant neoplasm of unspecified site: Secondary | ICD-10-CM

## 2024-06-30 DIAGNOSIS — R918 Other nonspecific abnormal finding of lung field: Secondary | ICD-10-CM | POA: Diagnosis not present

## 2024-06-30 DIAGNOSIS — R1013 Epigastric pain: Secondary | ICD-10-CM | POA: Diagnosis not present

## 2024-06-30 DIAGNOSIS — C7951 Secondary malignant neoplasm of bone: Secondary | ICD-10-CM | POA: Diagnosis not present

## 2024-06-30 DIAGNOSIS — C7981 Secondary malignant neoplasm of breast: Secondary | ICD-10-CM | POA: Diagnosis not present

## 2024-06-30 DIAGNOSIS — C801 Malignant (primary) neoplasm, unspecified: Secondary | ICD-10-CM | POA: Diagnosis not present

## 2024-06-30 DIAGNOSIS — R079 Chest pain, unspecified: Secondary | ICD-10-CM | POA: Insufficient documentation

## 2024-06-30 DIAGNOSIS — R0789 Other chest pain: Secondary | ICD-10-CM | POA: Diagnosis not present

## 2024-06-30 DIAGNOSIS — J90 Pleural effusion, not elsewhere classified: Secondary | ICD-10-CM | POA: Diagnosis not present

## 2024-06-30 DIAGNOSIS — R0602 Shortness of breath: Secondary | ICD-10-CM | POA: Diagnosis not present

## 2024-06-30 LAB — CBC WITH DIFFERENTIAL/PLATELET
Abs Immature Granulocytes: 0.15 K/uL — ABNORMAL HIGH (ref 0.00–0.07)
Basophils Absolute: 0.1 K/uL (ref 0.0–0.1)
Basophils Relative: 1 %
Eosinophils Absolute: 0.6 K/uL — ABNORMAL HIGH (ref 0.0–0.5)
Eosinophils Relative: 4 %
HCT: 45 % (ref 36.0–46.0)
Hemoglobin: 14.2 g/dL (ref 12.0–15.0)
Immature Granulocytes: 1 %
Lymphocytes Relative: 5 %
Lymphs Abs: 0.8 K/uL (ref 0.7–4.0)
MCH: 28.8 pg (ref 26.0–34.0)
MCHC: 31.6 g/dL (ref 30.0–36.0)
MCV: 91.3 fL (ref 80.0–100.0)
Monocytes Absolute: 1.5 K/uL — ABNORMAL HIGH (ref 0.1–1.0)
Monocytes Relative: 9 %
Neutro Abs: 13.9 K/uL — ABNORMAL HIGH (ref 1.7–7.7)
Neutrophils Relative %: 80 %
Platelets: 457 K/uL — ABNORMAL HIGH (ref 150–400)
RBC: 4.93 MIL/uL (ref 3.87–5.11)
RDW: 19.6 % — ABNORMAL HIGH (ref 11.5–15.5)
WBC: 17 K/uL — ABNORMAL HIGH (ref 4.0–10.5)
nRBC: 0 % (ref 0.0–0.2)

## 2024-06-30 LAB — COMPREHENSIVE METABOLIC PANEL WITH GFR
ALT: 9 U/L (ref 0–44)
AST: 52 U/L — ABNORMAL HIGH (ref 15–41)
Albumin: 4.2 g/dL (ref 3.5–5.0)
Alkaline Phosphatase: 349 U/L — ABNORMAL HIGH (ref 38–126)
Anion gap: 23 — ABNORMAL HIGH (ref 5–15)
BUN: 16 mg/dL (ref 8–23)
CO2: 12 mmol/L — ABNORMAL LOW (ref 22–32)
Calcium: 9.7 mg/dL (ref 8.9–10.3)
Chloride: 101 mmol/L (ref 98–111)
Creatinine, Ser: 0.94 mg/dL (ref 0.44–1.00)
GFR, Estimated: >60 mL/min (ref >60)
Glucose, Bld: 192 mg/dL — ABNORMAL HIGH (ref 70–99)
Potassium: 4.6 mmol/L (ref 3.5–5.1)
Sodium: 136 mmol/L (ref 135–145)
Total Bilirubin: 0.8 mg/dL (ref 0.0–1.2)
Total Protein: 7.8 g/dL (ref 6.5–8.1)

## 2024-06-30 LAB — LIPASE, BLOOD: Lipase: 29 U/L (ref 11–51)

## 2024-06-30 LAB — TROPONIN T, HIGH SENSITIVITY
Troponin T High Sensitivity: <15 ng/L (ref 0–19)
Troponin T High Sensitivity: <15 ng/L (ref 0–19)

## 2024-06-30 MED ORDER — TRAMADOL HCL 50 MG PO TABS
50.0000 mg | ORAL_TABLET | Freq: Once | ORAL | Status: AC
  Administered 2024-06-30: 50 mg via ORAL
  Filled 2024-06-30: qty 1

## 2024-06-30 MED ORDER — IOHEXOL 350 MG/ML SOLN
100.0000 mL | Freq: Once | INTRAVENOUS | Status: AC | PRN
  Administered 2024-06-30: 100 mL via INTRAVENOUS

## 2024-06-30 NOTE — ED Provider Notes (Signed)
 [place] EMERGENCY DEPARTMENT AT [facility] Provider Note   CSN: [id number] Arrival date & time: 06/30/24  1016     Patient presents with: Chest Pain   Barbara Parks is a 73 y.o. female.   73 year old female with a history of metastatic breast cancer on Xeloda  (started 11/21) and recurrent pleural effusion who presents to the emergency department chest pain and shortness of breath.  This morning had epigastric abdominal pain that radiates across her left chest to her back.  Says that it feels like she got kicked in the chest.  It is pleuritic and positional.  5/10 in severity.  Not exertional.  No diaphoresis or vomiting.  No fevers or chills or cough.  Is having shortness of breath.  No history of DVT or PE.  Had staging CT on 06/17/2024 that showed hepatic mets and multiple osseous mets.  Was supposed to have a thoracentesis today but says that she was unable to go because of the pain       Prior to Admission medications   Medication Sig Start Date End Date Taking? Authorizing Provider  albuterol  (VENTOLIN  HFA) 108 (90 Base) MCG/ACT inhaler Inhale 2 puffs into the lungs every 6 (six) hours as needed for wheezing or shortness of breath. 06/22/24   Odean Potts, MD  capecitabine  (XELODA ) 500 MG tablet Take 2 tablets (1,000 mg total) by mouth 2 (two) times daily after a meal. Take for 14 days and then off for 7 days 06/08/24   Odean Potts, MD  carvedilol  (COREG ) 3.125 MG tablet Take 1 tablet (3.125 mg total) by mouth 2 (two) times daily with a meal. 04/26/24   Odean Potts, MD  cetirizine (ZYRTEC) 10 MG tablet Take 10 mg by mouth daily.    [provider]  cholecalciferol (VITAMIN D3) 25 MCG (1000 UT) tablet Take 2 tablets (2,000 Units total) by mouth daily. 09/03/18   Magrinat, Sandria BROCKS, MD  cyclobenzaprine  (FLEXERIL ) 10 MG tablet Take 1 tablet (10 mg total) by mouth 3 (three) times daily as needed for muscle spasms. 04/01/24   Gudena, Vinay, MD  gemfibrozil   (LOPID ) 600 MG tablet Take 1 tablet (600 mg total) by mouth 2 (two) times daily before a meal. 06/08/24   Odean Potts, MD  lidocaine -prilocaine  (EMLA ) cream Apply 1 Application topically as needed. Apply 1 dose as needed for port a cath access 06/08/24   Odean Potts, MD  loperamide  (IMODIUM ) 2 MG capsule Take 2 mg by mouth 4 (four) times daily as needed for diarrhea or loose stools. Reported on 11/30/2015    [provider]  prochlorperazine  (COMPAZINE ) 10 MG tablet Take 1 tablet (10 mg total) by mouth every 6 (six) hours as needed for nausea or vomiting. 06/22/24   Pickenpack-Cousar, Athena N, NP  traMADol  (ULTRAM ) 50 MG tablet Take 1-2 tablets (50-100 mg total) by mouth every 6 (six) hours as needed. 06/09/24   Pickenpack-Cousar, Fannie SAILOR, NP    Allergies: Aspirin, Latex, and Nsaids    Review of Systems  Updated Vital Signs BP 99/62   Pulse 98   Temp 97.8 F (36.6 C) (Oral)   Resp (!) 22   Ht 5' 4.5 (1.638 m)   Wt 65.3 kg   SpO2 95%   BMI 24.34 kg/m   Physical Exam Vitals and nursing note reviewed.  Constitutional:      General: She is not in acute distress.    Appearance: She is well-developed.  HENT:     Head: Normocephalic  and atraumatic.     Right Ear: External ear normal.     Left Ear: External ear normal.     Nose: Nose normal.  Eyes:     Extraocular Movements: Extraocular movements intact.     Conjunctiva/sclera: Conjunctivae normal.     Pupils: Pupils are equal, round, and reactive to light.  Cardiovascular:     Rate and Rhythm: Normal rate and regular rhythm.     Heart sounds: No murmur heard. Pulmonary:     Effort: Pulmonary effort is normal. No respiratory distress.     Comments: Diminished in the right base.  Rales in the left base. Abdominal:     General: Abdomen is flat. There is no distension.     Palpations: Abdomen is soft. There is no mass.     Tenderness: There is abdominal tenderness (Epigastrium.  Negative Murphy sign.). There is no  guarding.  Musculoskeletal:     Cervical back: Normal range of motion and neck supple.     Right lower leg: No edema.     Left lower leg: No edema.  Skin:    General: Skin is warm and dry.  Neurological:     Mental Status: She is alert and oriented to person, place, and time. Mental status is at baseline.  Psychiatric:        Mood and Affect: Mood normal.     (all labs ordered are listed, but only abnormal results are displayed) Labs Reviewed  CBC WITH DIFFERENTIAL/PLATELET - Abnormal; Notable for the following components:      Result Value   WBC 17.0 (*)    RDW 19.6 (*)    Platelets 457 (*)    Neutro Abs 13.9 (*)    Monocytes Absolute 1.5 (*)    Eosinophils Absolute 0.6 (*)    Abs Immature Granulocytes 0.15 (*)    All other components within normal limits  COMPREHENSIVE METABOLIC PANEL WITH GFR - Abnormal; Notable for the following components:   CO2 12 (*)    Glucose, Bld 192 (*)    AST 52 (*)    Alkaline Phosphatase 349 (*)    Anion gap 23 (*)    All other components within normal limits  LIPASE, BLOOD  TROPONIN T, HIGH SENSITIVITY  TROPONIN T, HIGH SENSITIVITY    EKG: EKG Interpretation Date/Time:  Wednesday June 30 2024 10:27:52 EST Ventricular Rate:  123 PR Interval:  118 QRS Duration:  74 QT Interval:  310 QTC Calculation: 443 R Axis:   82  Text Interpretation: Sinus tachycardia Nonspecific ST and T wave abnormality Abnormal ECG When compared with ECG of 25-Jun-2023 11:55, Nonspecific T wave abnormality now evident in Lateral leads Confirmed by Yolande Charleston [id number]) on 06/30/2024 12:29:19 PM  Radiology: CT ABDOMEN PELVIS W CONTRAST Result Date: 06/30/2024 EXAM: CTA CHEST PE WITHOUT AND WITH CONTRAST CT ABDOMEN AND PELVIS WITHOUT AND WITH CONTRAST 06/30/2024 01:26:31 PM TECHNIQUE: CTA of the chest was performed after the administration of 100 mL of iohexol  (OMNIPAQUE ) 350 MG/ML injection. Multiplanar reformatted images are provided for review. MIP images  are provided for review. CT of the abdomen and pelvis was performed without and with the administration of 100 mL of iohexol  (OMNIPAQUE ) 350 MG/ML injection. Automated exposure control, iterative reconstruction, and/or weight based adjustment of the mA/kV was utilized to reduce the radiation dose to as low as reasonably achievable. COMPARISON: 06/17/2024. CLINICAL HISTORY: sob FINDINGS: CHEST: PULMONARY ARTERIES: Pulmonary arteries are adequately opacified for evaluation. No intraluminal filling defect to suggest pulmonary  embolism. Main pulmonary artery is normal in caliber. MEDIASTINUM: No hilar or mediastinal adenopathy. The heart and pericardium demonstrate no acute abnormality. There is no acute abnormality of the thoracic aorta. Stable right IJ (internal jugular) port catheter to the proximal right atrium. LUNGS AND PLEURA: Interval increase in moderate right pleural effusion, with posterior pleural thickening. Progression of multiple noncalcified pulmonary nodules in the left lower lobe, largest measuring 7 mm (10:108) previously 6 mm. 7 mm nodule in the anterior segment of the right lower lobe (10:75), previously 6 mm. No pneumothorax. SOFT TISSUES AND BONES: Surgical clips right axilla. Lytic/sclerotic lesions involving T4-T5, T10 and T11 vertebral bodies as before. Scattered sclerotic foci in multiple bilateral ribs as before. ABDOMEN AND PELVIS: LIVER: Innumerable hepatic low attenuation lesions, index 2.5 cm in segment 2, previously 1.8 cm. Largest in the central right lobe 4.4 cm, previously 2.9 cm. GALLBLADDER AND BILE DUCTS: Gallbladder is unremarkable. No biliary ductal dilatation. SPLEEN: Spleen demonstrates no acute abnormality. PANCREAS: Pancreas demonstrates no acute abnormality. ADRENAL GLANDS: Adrenal glands demonstrate no acute abnormality. KIDNEYS, URETERS AND BLADDER: 1.6 cm left renal nodule, previously 1.4 cm. No stones in the kidneys or ureters. No hydronephrosis. No perinephric or  periureteral stranding. Urinary bladder is unremarkable. GI AND BOWEL: Stomach and duodenal sweep demonstrate no acute abnormality. There is no bowel obstruction. No abnormal bowel wall thickening or distension. REPRODUCTIVE: Reproductive organs are unremarkable. PERITONEUM AND RETROPERITONEUM: No ascites or free air. LYMPH NODES: No lymphadenopathy. BONES AND SOFT TISSUES: Moderate calcified aortic plaque without aneurysm. Left hip arthroplasty. Mixed lytic/sclerotic lesions in L3 and L5. Mixed lytic and sclerotic lesion in the right femoral neck. Lesions in the superior left acetabulum, bilateral iliac bones. No focal soft tissue abnormality. IMPRESSION: 1. Interval increase in moderate right pleural effusion with posterior pleural thickening. 2. Progression of multiple noncalcified pulmonary nodules. 3. Progression of innumerable hepatic low-attenuation lesions. 4. Mixed lytic and sclerotic osseous metastases, overall similar in distribution. Electronically signed by: Katheleen Faes MD 06/30/2024 02:17 PM EST RP Workstation: [id number]   CT Angio Chest Pulmonary Embolism (PE) W or WO Contrast Result Date: 06/30/2024 EXAM: CTA CHEST PE WITHOUT AND WITH CONTRAST CT ABDOMEN AND PELVIS WITHOUT AND WITH CONTRAST 06/30/2024 01:26:31 PM TECHNIQUE: CTA of the chest was performed after the administration of 100 mL of iohexol  (OMNIPAQUE ) 350 MG/ML injection. Multiplanar reformatted images are provided for review. MIP images are provided for review. CT of the abdomen and pelvis was performed without and with the administration of 100 mL of iohexol  (OMNIPAQUE ) 350 MG/ML injection. Automated exposure control, iterative reconstruction, and/or weight based adjustment of the mA/kV was utilized to reduce the radiation dose to as low as reasonably achievable. COMPARISON: 06/17/2024. CLINICAL HISTORY: sob FINDINGS: CHEST: PULMONARY ARTERIES: Pulmonary arteries are adequately opacified for evaluation. No intraluminal filling  defect to suggest pulmonary embolism. Main pulmonary artery is normal in caliber. MEDIASTINUM: No hilar or mediastinal adenopathy. The heart and pericardium demonstrate no acute abnormality. There is no acute abnormality of the thoracic aorta. Stable right IJ (internal jugular) port catheter to the proximal right atrium. LUNGS AND PLEURA: Interval increase in moderate right pleural effusion, with posterior pleural thickening. Progression of multiple noncalcified pulmonary nodules in the left lower lobe, largest measuring 7 mm (10:108) previously 6 mm. 7 mm nodule in the anterior segment of the right lower lobe (10:75), previously 6 mm. No pneumothorax. SOFT TISSUES AND BONES: Surgical clips right axilla. Lytic/sclerotic lesions involving T4-T5, T10 and T11 vertebral bodies as before. Scattered sclerotic  foci in multiple bilateral ribs as before. ABDOMEN AND PELVIS: LIVER: Innumerable hepatic low attenuation lesions, index 2.5 cm in segment 2, previously 1.8 cm. Largest in the central right lobe 4.4 cm, previously 2.9 cm. GALLBLADDER AND BILE DUCTS: Gallbladder is unremarkable. No biliary ductal dilatation. SPLEEN: Spleen demonstrates no acute abnormality. PANCREAS: Pancreas demonstrates no acute abnormality. ADRENAL GLANDS: Adrenal glands demonstrate no acute abnormality. KIDNEYS, URETERS AND BLADDER: 1.6 cm left renal nodule, previously 1.4 cm. No stones in the kidneys or ureters. No hydronephrosis. No perinephric or periureteral stranding. Urinary bladder is unremarkable. GI AND BOWEL: Stomach and duodenal sweep demonstrate no acute abnormality. There is no bowel obstruction. No abnormal bowel wall thickening or distension. REPRODUCTIVE: Reproductive organs are unremarkable. PERITONEUM AND RETROPERITONEUM: No ascites or free air. LYMPH NODES: No lymphadenopathy. BONES AND SOFT TISSUES: Moderate calcified aortic plaque without aneurysm. Left hip arthroplasty. Mixed lytic/sclerotic lesions in L3 and L5. Mixed lytic  and sclerotic lesion in the right femoral neck. Lesions in the superior left acetabulum, bilateral iliac bones. No focal soft tissue abnormality. IMPRESSION: 1. Interval increase in moderate right pleural effusion with posterior pleural thickening. 2. Progression of multiple noncalcified pulmonary nodules. 3. Progression of innumerable hepatic low-attenuation lesions. 4. Mixed lytic and sclerotic osseous metastases, overall similar in distribution. Electronically signed by: Katheleen Faes MD 06/30/2024 02:17 PM EST RP Workstation: [id number]   DG Chest 2 View Result Date: 06/30/2024 EXAM: 2 VIEW(S) XRAY OF THE CHEST 06/30/2024 11:27:00 AM COMPARISON: 06/09/2024 CLINICAL HISTORY: SOB, CP FINDINGS: LINES, TUBES AND DEVICES: Right internal jugular port-a-cath is unchanged. LUNGS AND PLEURA: Increased right pleural effusion is noted with probable associated atelectasis. No pneumothorax. HEART AND MEDIASTINUM: No acute abnormality of the cardiac and mediastinal silhouettes. BONES AND SOFT TISSUES: No acute osseous abnormality. IMPRESSION: 1. Increased right pleural effusion with probable associated atelectasis. Electronically signed by: Lynwood Seip MD 06/30/2024 12:05 PM EST RP Workstation: [id number]     Procedures   Medications Ordered in the ED  iohexol  (OMNIPAQUE ) 350 MG/ML injection 100 mL (100 mLs Intravenous Contrast Given 06/30/24 1305)  traMADol  (ULTRAM ) tablet 50 mg (50 mg Oral Given 06/30/24 1512)                                    Medical Decision Making Amount and/or Complexity of Data Reviewed Labs: ordered. Radiology: ordered.  Risk Prescription drug management.   Barbara Parks is a 73 year old female with a history of metastatic breast cancer on Xeloda  (started 11/21) and recurrent pleural effusion who presents to the emergency department chest pain and shortness of breath.   Initial Ddx:  Cancer related pain, bony metastases, lung mets, PE, pericarditis  MDM/Course:   Patient presents emergency department with epigastric and left-sided chest pain.  Does have a history of metastatic breast cancer.  Does also have a history of recurrent pleural effusion but this is on the right.  Also is having some shortness of breath.  Not currently on anticoagulation.  On exam does have epigastric tenderness to palpation.  Does have diminished breath sounds in the right base and rales in the left base.  Satting well on room air.  Not in acute distress.  CTA of the chest without PE.  Does show right-sided pleural effusion with stable bony metastases and worsening pulmonary nodules.  CT of the abdomen pelvis also shows worsening liver metastases.  EKG without obvious signs of pericarditis.  Serial troponins negative  x 2.  Labs show mild leukocytosis and metabolic acidosis which appear to be chronic though mildly worse than usual.  Upon re-evaluation still having some pain.  Is requesting tramadol  at this point in time.  Will have her continue this and follow-up with her PCP and oncologist.   This patient presents to the ED for concern of complaints listed in HPI, this involves an extensive number of treatment options, and is a complaint that carries with it a high risk of complications and morbidity. Disposition including potential need for admission considered.   Dispo: DC Home. Return precautions discussed including, but not limited to, those listed in the AVS. Allowed pt time to ask questions which were answered fully prior to dc.  Records reviewed Outpatient Clinic Notes The following labs were independently interpreted: CBC and show leukocytosis I independently reviewed the following imaging with scope of interpretation limited to determining acute life threatening conditions related to emergency care: Chest x-ray and agree with the radiologist interpretation with the following exceptions: none I personally reviewed and interpreted cardiac monitoring: normal sinus rhythm  I  personally reviewed and interpreted the pt's EKG: see above for interpretation  I have reviewed the patients home medications and made adjustments as needed Social Determinants of health:  Geriatric  Portions of this note were generated with Scientist, clinical (histocompatibility and immunogenetics). Dictation errors may occur despite best attempts at proofreading.     Final diagnoses:  Chest pain, unspecified type  Metastatic malignant neoplasm, unspecified site [facility])    ED Discharge Orders     None          Yolande Lamar BROCKS, MD 06/30/24 1513

## 2024-06-30 NOTE — ED Triage Notes (Signed)
 Pt arrived via POV c/o SOB and chest pain that began this morning. Pt reports pain is worse when inhaling. Pt reports canceling thoracentesis this morning due to the pain. Pt presents with RUE restriction and is actively receiving treatment for cancer.

## 2024-06-30 NOTE — Discharge Instructions (Signed)
 You were seen for your chest pain in the emergency department.  It is likely related to your metastatic cancer.  At home, please take Tylenol  and your tramadol .    Check your MyChart online for the results of any tests that had not resulted by the time you left the emergency department.   Follow-up with your primary doctor in 2-3 days regarding your visit.    Return immediately to the emergency department if you experience any of the following: Worsening pain, difficulty breathing, or any other concerning symptoms.    Thank you for visiting our Emergency Department. It was a pleasure taking care of you today.

## 2024-06-30 NOTE — ED Notes (Signed)
 Pt's neighbor Jules) called to pick up pt to be transported home.

## 2024-06-30 NOTE — ED Notes (Signed)
 Pt ambulating back to room from restroom; tripped on her foot. This nurse caught the pt and slowly lowered her to the ground. No head strike and pt reporting no new pain. Pt ambulated back to room with assistance of this nurse and another nurse. MD notified.

## 2024-06-30 NOTE — Assessment & Plan Note (Deleted)
 Metastatic breast cancer (pericardial effusion, pleural effusion, right middle lobe lung lesion, ER 91%, PR 90%, Ki-67 35%, HER2 negative diagnosed February 2023)   (Prior history of right mastectomy 2005, adjuvant chemo with Adriamycin Cytoxan and Taxol followed by tamoxifen)   Current treatment:  1. Ribociclib , fulvestrant , Xgeva , ribociclib  discontinued 10/18/2021  2. abemaciclib  started 10/27/2021 3. Enhertu  discontinued for progression after 16 cycles on 06/06/2023 4.  Elacestrant  06/18/2023-03/16/2024 5. Capivasertib  started 04/13/2024-05/15/2024 discontinued for rash and diarrhea    Foundation 1 in 2021: MSI stable, TMB 1, ESR 1 mutation, PTEN, FGF 10, RICTOR amplifications   Guardant360 01/14/2022: No actionable mutations.  ESR 1 mutation. Liver biopsy 02/26/2022: Metastatic carcinoma consistent with breast primary. Caris mutation testing 02/26/2022: HER2 low, ESR 1 mutation, PTEN She has a puppy named [name]).    01/23/2024: CT CAP: Moderate loculated right pleural effusion, stable liver lesions, stable bone metastasis 03/05/2024: MRI liver: Mixed response to hepatic metastases some are smaller, few new lesions, mild progression of bone metastases    CA 27-29: 356 on 03/01/2024 (was 149 on 09/15/2023) Port Blood clot: Currently on anticoagulation with Eliquis    Current treatment:  Xeloda  1000 mg p.o. twice daily 14 days on 7 days off started 06/18/2024 Xeloda  toxicities: Tolerating extremely well without any problems or concerns     06/17/2024: CT CAP: Marked progression of hepatic metastases.  Innumerable new lesions.  Index right hepatic lobe lesion 2.9 cm, new pulmonary nodules, widespread bone metastases.  Left adrenal adenoma   Shortness of breath: Status post thoracentesis

## 2024-06-30 NOTE — Telephone Encounter (Signed)
 Received call from pt stating she cancelled thoracentesis due to experiencing severe pain with inhalation, she didn't feel comfortable waiting till her appointment and driving here from [place]. I advised her to go to [facility] ED asap to be seen. Pt verbalized understanding.

## 2024-07-02 ENCOUNTER — Encounter (INDEPENDENT_AMBULATORY_CARE_PROVIDER_SITE_OTHER): Payer: Self-pay

## 2024-07-02 ENCOUNTER — Other Ambulatory Visit (HOSPITAL_COMMUNITY): Payer: Self-pay

## 2024-07-02 ENCOUNTER — Other Ambulatory Visit: Payer: Self-pay

## 2024-07-05 ENCOUNTER — Inpatient Hospital Stay: Admitting: Nurse Practitioner

## 2024-07-05 ENCOUNTER — Inpatient Hospital Stay: Attending: Hematology and Oncology

## 2024-07-05 ENCOUNTER — Other Ambulatory Visit: Payer: Self-pay

## 2024-07-05 ENCOUNTER — Inpatient Hospital Stay: Admitting: Licensed Clinical Social Worker

## 2024-07-05 ENCOUNTER — Telehealth: Payer: Self-pay | Admitting: Hematology and Oncology

## 2024-07-05 ENCOUNTER — Other Ambulatory Visit (HOSPITAL_COMMUNITY): Payer: Self-pay

## 2024-07-05 ENCOUNTER — Inpatient Hospital Stay: Admitting: Hematology and Oncology

## 2024-07-05 DIAGNOSIS — C50911 Malignant neoplasm of unspecified site of right female breast: Secondary | ICD-10-CM

## 2024-07-05 NOTE — Telephone Encounter (Signed)
 Spoke with pt to reschedule canceled appts. Pt stated she will have to call back when she has stable transportation

## 2024-07-05 NOTE — Progress Notes (Signed)
 Specialty Pharmacy Refill Coordination Note  MyChart Questionnaire Submission  Barbara Parks is a 73 y.o. female contacted today regarding refills of specialty medication(s) Xeloda .  Doses on hand: 0   Patient requested: Delivery  Delivery date: 07/07/24  Verified address: [address] [name] [address]  Medication will be filled on 07/06/24.

## 2024-07-05 NOTE — Progress Notes (Signed)
 Specialty Pharmacy Ongoing Clinical Assessment Note  Barbara Parks is a 73 y.o. female who is being followed by the specialty pharmacy service for RxSp Oncology   Patient's specialty medication(s) reviewed today: Capecitabine  (XELODA )   Missed doses in the last 4 weeks: 0   Patient/Caregiver did not have any additional questions or concerns.   Therapeutic benefit summary: Unable to assess   Adverse events/side effects summary: Experienced adverse events/side effects (Very mild diarrhea, able to treat with Imodium . Also endorses fatigue and lack of appetite, but both have improved.)   Patient's therapy is appropriate to: Continue    Goals Addressed             This Visit's Progress    Maintain optimal adherence to therapy   On track    Patient is on track. Patient will maintain adherence         Follow up: 3 months  [facility] Specialty Pharmacist

## 2024-07-12 ENCOUNTER — Inpatient Hospital Stay: Attending: Hematology and Oncology | Admitting: Hematology and Oncology

## 2024-07-12 ENCOUNTER — Inpatient Hospital Stay: Attending: Hematology and Oncology

## 2024-07-12 VITALS — BP 99/56 | HR 93 | Temp 98.3°F | Resp 18 | Ht 65.5 in | Wt 145.9 lb

## 2024-07-12 DIAGNOSIS — C50911 Malignant neoplasm of unspecified site of right female breast: Secondary | ICD-10-CM | POA: Diagnosis not present

## 2024-07-12 DIAGNOSIS — C50411 Malignant neoplasm of upper-outer quadrant of right female breast: Secondary | ICD-10-CM | POA: Insufficient documentation

## 2024-07-12 DIAGNOSIS — G893 Neoplasm related pain (acute) (chronic): Secondary | ICD-10-CM | POA: Insufficient documentation

## 2024-07-12 DIAGNOSIS — R530 Neoplastic (malignant) related fatigue: Secondary | ICD-10-CM | POA: Insufficient documentation

## 2024-07-12 DIAGNOSIS — C7800 Secondary malignant neoplasm of unspecified lung: Secondary | ICD-10-CM | POA: Insufficient documentation

## 2024-07-12 DIAGNOSIS — Z9011 Acquired absence of right breast and nipple: Secondary | ICD-10-CM | POA: Diagnosis not present

## 2024-07-12 DIAGNOSIS — R630 Anorexia: Secondary | ICD-10-CM | POA: Diagnosis not present

## 2024-07-12 DIAGNOSIS — C787 Secondary malignant neoplasm of liver and intrahepatic bile duct: Secondary | ICD-10-CM | POA: Insufficient documentation

## 2024-07-12 DIAGNOSIS — C7951 Secondary malignant neoplasm of bone: Secondary | ICD-10-CM | POA: Diagnosis present

## 2024-07-12 DIAGNOSIS — Z170 Estrogen receptor positive status [ER+]: Secondary | ICD-10-CM | POA: Diagnosis not present

## 2024-07-12 DIAGNOSIS — R0600 Dyspnea, unspecified: Secondary | ICD-10-CM | POA: Insufficient documentation

## 2024-07-12 DIAGNOSIS — Z79899 Other long term (current) drug therapy: Secondary | ICD-10-CM | POA: Insufficient documentation

## 2024-07-12 DIAGNOSIS — J910 Malignant pleural effusion: Secondary | ICD-10-CM | POA: Diagnosis not present

## 2024-07-12 LAB — CMP (CANCER CENTER ONLY)
ALT: 10 U/L (ref 0–44)
AST: 52 U/L — ABNORMAL HIGH (ref 15–41)
Albumin: 3.6 g/dL (ref 3.5–5.0)
Alkaline Phosphatase: 436 U/L — ABNORMAL HIGH (ref 38–126)
Anion gap: 15 (ref 5–15)
BUN: 15 mg/dL (ref 8–23)
CO2: 17 mmol/L — ABNORMAL LOW (ref 22–32)
Calcium: 8.7 mg/dL — ABNORMAL LOW (ref 8.9–10.3)
Chloride: 104 mmol/L (ref 98–111)
Creatinine: 0.88 mg/dL (ref 0.44–1.00)
GFR, Estimated: >60 mL/min (ref >60)
Glucose, Bld: 137 mg/dL — ABNORMAL HIGH (ref 70–99)
Potassium: 3.8 mmol/L (ref 3.5–5.1)
Sodium: 136 mmol/L (ref 135–145)
Total Bilirubin: 0.7 mg/dL (ref 0.0–1.2)
Total Protein: 6.8 g/dL (ref 6.5–8.1)

## 2024-07-12 LAB — CBC WITH DIFFERENTIAL (CANCER CENTER ONLY)
Abs Immature Granulocytes: 0.1 K/uL — ABNORMAL HIGH (ref 0.00–0.07)
Basophils Absolute: 0.2 K/uL — ABNORMAL HIGH (ref 0.0–0.1)
Basophils Relative: 1 %
Eosinophils Absolute: 1.1 K/uL — ABNORMAL HIGH (ref 0.0–0.5)
Eosinophils Relative: 7 %
HCT: 39.5 % (ref 36.0–46.0)
Hemoglobin: 12.4 g/dL (ref 12.0–15.0)
Immature Granulocytes: 1 %
Lymphocytes Relative: 4 %
Lymphs Abs: 0.6 K/uL — ABNORMAL LOW (ref 0.7–4.0)
MCH: 29.5 pg (ref 26.0–34.0)
MCHC: 31.4 g/dL (ref 30.0–36.0)
MCV: 93.8 fL (ref 80.0–100.0)
Monocytes Absolute: 1.8 K/uL — ABNORMAL HIGH (ref 0.1–1.0)
Monocytes Relative: 11 %
Neutro Abs: 12.7 K/uL — ABNORMAL HIGH (ref 1.7–7.7)
Neutrophils Relative %: 76 %
Platelet Count: 246 K/uL (ref 150–400)
RBC: 4.21 MIL/uL (ref 3.87–5.11)
RDW: 21.8 % — ABNORMAL HIGH (ref 11.5–15.5)
WBC Count: 16.5 K/uL — ABNORMAL HIGH (ref 4.0–10.5)
nRBC: 0 % (ref 0.0–0.2)

## 2024-07-12 NOTE — Assessment & Plan Note (Signed)
 Metastatic breast cancer (pericardial effusion, pleural effusion, right middle lobe lung lesion, ER 91%, PR 90%, Ki-67 35%, HER2 negative diagnosed February 2023)   (Prior history of right mastectomy 2005, adjuvant chemo with Adriamycin Cytoxan and Taxol followed by tamoxifen)   Current treatment:  1. Ribociclib , fulvestrant , Xgeva , ribociclib  discontinued 10/18/2021  2. abemaciclib  started 10/27/2021 3. Enhertu  discontinued for progression after 16 cycles on 06/06/2023 4.  Elacestrant  06/18/2023-03/16/2024 5. Capivasertib  started 04/13/2024-05/15/2024 discontinued for rash and diarrhea    Foundation 1 in 2021: MSI stable, TMB 1, ESR 1 mutation, PTEN, FGF 10, RICTOR amplifications   Guardant360 01/14/2022: No actionable mutations.  ESR 1 mutation. Liver biopsy 02/26/2022: Metastatic carcinoma consistent with breast primary. Caris mutation testing 02/26/2022: HER2 low, ESR 1 mutation, PTEN She has a puppy named [name]).    01/23/2024: CT CAP: Moderate loculated right pleural effusion, stable liver lesions, stable bone metastasis 03/05/2024: MRI liver: Mixed response to hepatic metastases some are smaller, few new lesions, mild progression of bone metastases    CA 27-29: 356 on 03/01/2024 (was 149 on 09/15/2023) Port Blood clot: Currently on anticoagulation with Eliquis    Current treatment:  Xeloda  1000 mg p.o. twice daily 14 days on 7 days off started 06/18/2024 Xeloda  toxicities: Tolerating extremely well without any problems or concerns     06/17/2024: CT CAP: Marked progression of hepatic metastases.  Innumerable new lesions.  Index right hepatic lobe lesion 2.9 cm, new pulmonary nodules, widespread bone metastases.  Left adrenal adenoma   Shortness of breath: Will request thoracentesis as well as I sent a prescription for an inhaler. I discussed with her about getting her living will and power of attorney finalized.  She will also make sure that she has all her affairs in order.  Her  biggest concern is regarding her pet dog as to who will take care of him   Return to clinic in 1 month with labs and follow-up

## 2024-07-12 NOTE — Progress Notes (Signed)
 Patient Care Team: Dyane Anthony RAMAN, FNP as PCP - General (Family Medicine) Alvan Ronal BRAVO, MD (Inactive) as PCP - Cardiology (Cardiology) Jeffrie Oneil BROCKS, MD (Cardiology) Lucas Dorise POUR, MD (Cardiothoracic Surgery) Lucila Norleen LABOR, RPH-CPP (Pharmacist) Dannielle Alm BRAVO, MD as Referring Physician (Radiation Oncology) Odean Potts, MD as Consulting Physician (Hematology and Oncology) Pickenpack-Cousar, Fannie SAILOR, NP as Nurse Practitioner [facility] and Palliative Medicine)  DIAGNOSIS:  Encounter Diagnosis  Name Primary?   Carcinoma of right breast metastatic to lung (HCC) Yes    SUMMARY OF ONCOLOGIC HISTORY: Oncology History  Carcinoma of right breast metastatic to lung (HCC)  10/10/2011 Initial Diagnosis   Carcinoma of right breast metastatic to lung (HCC)   05/02/2022 - 06/06/2023 Chemotherapy   Patient is on Treatment Plan : BREAST METASTATIC Fam-Trastuzumab Deruxtecan-nxki  (Enhertu ) (5.4) q21d     Malignant neoplasm of upper-outer quadrant of right breast in female, estrogen receptor positive (HCC)  03/22/2004 Initial Diagnosis   right mastectomy and sentinel lymph node sampling 03/22/2004 for a right upper-outer quadrant pT2 pN1, stage IIB invasive ductal carcinoma with lobular features, grade 2, estrogen receptor and progesterone receptor positive, HER-2 negative, with an MIB-1 of 9%   05/2004 - 09/18/2004 Chemotherapy   adjuvant chemotherapy with dose dense doxorubicin and cyclophosphamide x4 cycles (first cycle delayed one week) followed by dose dense paclitaxel x4 was completed 09/18/2004   09/2004 - 2009 Anti-estrogen oral therapy   tamoxifen started March 2006, discontinued 2009   08/2011 Relapse/Recurrence   Metastatic disease:large pericardial effusion, large right pleural effusion and possible right middle lobe bronchial obstruction  status post pericardial window placement, fiberoptic bronchoscopy and right Pleurx placement 09/11/2011, with biopsy of the bronchus  intermedius and pericardium positive for metastatic breast cancer, ER 91%, PR 100% p with an MIB-1 of 35%, and no HER-2 amplification, ratio being 1.37   10/10/2011 - 07/2019 Anti-estrogen oral therapy   BOLERO-4 trial 10/10/2011, receiving letrozole  and everolimus  (stopped for progression)   08/16/2019 Miscellaneous   foundation 1 requested on liver biopsy from 08/16/2019 shows a stable microsatellite status with 1 mutation/MB, amplification of C11orf30, FGF10 and RICTOR, with mutations in ESR1 and PTEN   08/16/2019 Miscellaneous   Liver biopsy: Metastatic carcinoma to the liver consistent with breast primary, ER 100%, PR 100%, Ki-67 5%, HER2 2+ by IHC, FISH HER2 negative   08/24/2019 -  Anti-estrogen oral therapy   fulvestrant  started 08/24/2019, repeated every 4 weeks Palbociclib  125 mg daily 21 days on 7 days off started 08/25/2019 Switched to ribociclib  s starting 08/22/2020   02/28/2021 -  Radiation Therapy   palliative radiation (Dr. Dannielle in [place]) 02/28/2021 to right hip area   10/27/2021 -  Anti-estrogen oral therapy   Abemaciclib  with fulvestrant    01/14/2022 Miscellaneous   Guardant360: ESR 1 mutation (no other mutations) MSI high was not detected, PTEN, T p53, EGFR amplification, RB1 mutations detected without any approved treatment options   Malignant neoplasm metastatic to liver (HCC)  08/09/2019 Initial Diagnosis   Malignant neoplasm metastatic to liver (HCC)     CHIEF COMPLIANT: Follow-up on Xeloda  therapy  HISTORY OF PRESENT ILLNESS:  History of Present Illness Barbara Parks is a 73 year old female with metastatic right breast carcinoma involving lung, liver, and bone who presents for oncology follow-up regarding recent severe abdominal pain and ongoing cancer-related symptoms.  She recently developed severe diffuse abdominal pain with visible blood and marked tenderness that limited breathing and touch, prompting emergency department evaluation including blood tests  and CT imaging  for pancreatitis, cholelithiasis, and thromboembolic disease. The pain resolved, then recurred less severely two days ago, is worsened by movement and lifting (more on the left), and improves with rest and analgesics. She uses ibuprofen with food for soreness and cyclobenzaprine  for back tightness, which also provide some relief for dyspnea.  She is on the second cycle of capecitabine  (two weeks on, one week off) for progressive metastatic disease. During the first two to three days of each cycle she has significant fatigue and poor appetite that then improve. She denies other notable toxicities. She uses an inhaler about once per week for chest tightness to aid expectoration and breathing. She occasionally feels lightheaded with a sensation of low oxygen, especially when dyspnea worsens.  She missed a scheduled thoracentesis because of the emergency visit and still has dyspnea. Her breathing improved at the end of the first capecitabine  cycle and start of the second, but has since worsened.  During the emergency visit she had a fall related to dehydration and tripping, with resulting soreness and back tightness but no acute injury, managed with rest and cyclobenzaprine . She previously had transportation barriers but now has reliable access to her car and is independent with travel for care.      ALLERGIES:  is allergic to aspirin, latex, and nsaids.  MEDICATIONS:  Current Outpatient Medications  Medication Sig Dispense Refill   albuterol  (VENTOLIN  HFA) 108 (90 Base) MCG/ACT inhaler Inhale 2 puffs into the lungs every 6 (six) hours as needed for wheezing or shortness of breath. 8 g 6   capecitabine  (XELODA ) 500 MG tablet Take 2 tablets (1,000 mg total) by mouth 2 (two) times daily after a meal. Take for 14 days and then off for 7 days 56 tablet 6   carvedilol  (COREG ) 3.125 MG tablet Take 1 tablet (3.125 mg total) by mouth 2 (two) times daily with a meal. 180 tablet 3   cetirizine  (ZYRTEC) 10 MG tablet Take 10 mg by mouth daily.     cholecalciferol (VITAMIN D3) 25 MCG (1000 UT) tablet Take 2 tablets (2,000 Units total) by mouth daily. 180 tablet 4   cyclobenzaprine  (FLEXERIL ) 10 MG tablet Take 1 tablet (10 mg total) by mouth 3 (three) times daily as needed for muscle spasms. 60 tablet 0   gemfibrozil  (LOPID ) 600 MG tablet Take 1 tablet (600 mg total) by mouth 2 (two) times daily before a meal. 180 tablet 2   lidocaine -prilocaine  (EMLA ) cream Apply 1 Application topically as needed. Apply 1 dose as needed for port a cath access 30 g 5   loperamide  (IMODIUM ) 2 MG capsule Take 2 mg by mouth 4 (four) times daily as needed for diarrhea or loose stools. Reported on 11/30/2015     prochlorperazine  (COMPAZINE ) 10 MG tablet Take 1 tablet (10 mg total) by mouth every 6 (six) hours as needed for nausea or vomiting. 30 tablet 3   traMADol  (ULTRAM ) 50 MG tablet Take 1-2 tablets (50-100 mg total) by mouth every 6 (six) hours as needed. 45 tablet 0   No current facility-administered medications for this visit.    PHYSICAL EXAMINATION: ECOG PERFORMANCE STATUS: 1 - Symptomatic but completely ambulatory  Vitals:   07/12/24 1000 07/12/24 1034  BP: (!) 90/50 (!) 99/56  Pulse: 93   Resp: 18   Temp: 98.3 F (36.8 C)   SpO2: 100%    Filed Weights   07/12/24 1000  Weight: 145 lb 14.4 oz (66.2 kg)    Physical Exam    (exam  performed in the presence of a chaperone)  LABORATORY DATA:  I have reviewed the data as listed    Latest Ref Rng & Units 07/12/2024    9:47 AM 06/30/2024   10:28 AM 06/08/2024   11:15 AM  CMP  Glucose 70 - 99 mg/dL 862  807  875   BUN 8 - 23 mg/dL 15  16  17    Creatinine 0.44 - 1.00 mg/dL 9.11  9.05  9.15   Sodium 135 - 145 mmol/L 136  136  137   Potassium 3.5 - 5.1 mmol/L 3.8  4.6  3.7   Chloride 98 - 111 mmol/L 104  101  110   CO2 22 - 32 mmol/L 17  12  17    Calcium  8.9 - 10.3 mg/dL 8.7  9.7  9.1   Total Protein 6.5 - 8.1 g/dL 6.8  7.8  7.5   Total  Bilirubin 0.0 - 1.2 mg/dL 0.7  0.8  0.5   Alkaline Phos 38 - 126 U/L 436  349  207   AST 15 - 41 U/L 52  52  19   ALT 0 - 44 U/L 10  9  12      Lab Results  Component Value Date   WBC 16.5 (H) 07/12/2024   HGB 12.4 07/12/2024   HCT 39.5 07/12/2024   MCV 93.8 07/12/2024   PLT 246 07/12/2024   NEUTROABS 12.7 (H) 07/12/2024    ASSESSMENT & PLAN:  Carcinoma of right breast metastatic to lung [facility]) Metastatic breast cancer (pericardial effusion, pleural effusion, right middle lobe lung lesion, ER 91%, PR 90%, Ki-67 35%, HER2 negative diagnosed February 2023)   (Prior history of right mastectomy 2005, adjuvant chemo with Adriamycin Cytoxan and Taxol followed by tamoxifen)   Current treatment:  1. Ribociclib , fulvestrant , Xgeva , ribociclib  discontinued 10/18/2021  2. abemaciclib  started 10/27/2021 3. Enhertu  discontinued for progression after 16 cycles on 06/06/2023 4.  Elacestrant  06/18/2023-03/16/2024 5. Capivasertib  started 04/13/2024-05/15/2024 discontinued for rash and diarrhea    Foundation 1 in 2021: MSI stable, TMB 1, ESR 1 mutation, PTEN, FGF 10, RICTOR amplifications   Guardant360 01/14/2022: No actionable mutations.  ESR 1 mutation. Liver biopsy 02/26/2022: Metastatic carcinoma consistent with breast primary. Caris mutation testing 02/26/2022: HER2 low, ESR 1 mutation, PTEN She has a puppy named [name]).    01/23/2024: CT CAP: Moderate loculated right pleural effusion, stable liver lesions, stable bone metastasis 03/05/2024: MRI liver: Mixed response to hepatic metastases some are smaller, few new lesions, mild progression of bone metastases    CA 27-29: 356 on 03/01/2024 (was 149 on 09/15/2023) Port Blood clot: Currently on anticoagulation with Eliquis    Current treatment:  Xeloda  1000 mg p.o. twice daily 14 days on 7 days off started 06/18/2024 Xeloda  toxicities: Tolerating extremely well without any problems or concerns     06/17/2024: CT CAP: Marked progression of hepatic  metastases.  Innumerable new lesions.  Index right hepatic lobe lesion 2.9 cm, new pulmonary nodules, widespread bone metastases.  Left adrenal adenoma   Shortness of breath: Thoracentesis could not be done earlier because she went to the emergency room.  Will try to reschedule it for later time. Follow-up on tumor markers Return to clinic in 1 month with labs and follow-up   ------------------------------------- Assessment and Plan Assessment & Plan Metastatic carcinoma of the right breast to lung and liver Prognosis poor due to advanced metastatic burden and progression on prior therapies. - Continue capecitabine  500 mg orally twice daily for 14  days, then 7 days off (21-day cycle). - Await tumor marker results to assess response. - Order follow-up laboratory studies in one month. - Schedule follow-up visit in one month to assess disease progression and treatment efficacy. - Plan repeat imaging in approximately two months unless clinical status changes.  Malignant pleural effusion due to metastatic cancer Dyspnea persists due to malignant pleural effusion. Symptomatic benefit from fluid removal, particularly for hypoxia. - Schedule thoracentesis after Christmas for symptomatic relief. - Instruct to use albuterol  inhaler as needed for dyspnea.  Cancer-related pain Episodic severe pain likely secondary to hepatic and bone metastases. Pain aggravated by activity and relieved with rest, tramadol , and ibuprofen. Transient back pain managed with cyclobenzaprine . - Continue current pain regimen, including as-needed tramadol  and cyclobenzaprine . - Advise cautious use of ibuprofen with food and monitor for gastrointestinal side effects. - Encourage activity modification to minimize pain exacerbation.  Anorexia and fatigue secondary to cancer and chemotherapy Fatigue and anorexia manageable, particularly during initial days of each capecitabine  cycle. Expected adverse effects of  chemotherapy. - Monitor for changes in appetite and fatigue, especially during chemotherapy cycles.      Orders Placed This Encounter  Procedures   CBC with Differential (Cancer Center Only)    Standing Status:   Future    Expiration Date:   07/12/2025   CMP (Cancer Center only)    Standing Status:   Future    Expiration Date:   07/12/2025   CA 27.29    Standing Status:   Future    Expiration Date:   07/12/2025   The patient has a good understanding of the overall plan. she agrees with it. she will call with any problems that may develop before the next visit here.  I personally spent a total of 30 minutes in the care of the patient today including preparing to see the patient, getting/reviewing separately obtained history, performing a medically appropriate exam/evaluation, counseling and educating, placing orders, referring and communicating with other health care professionals, documenting clinical information in the EHR, independently interpreting results, communicating results, and coordinating care.   Viinay K Haley Roza, MD 07/12/2024

## 2024-07-13 LAB — CANCER ANTIGEN 27.29: CA 27.29: 449.7 U/mL — ABNORMAL HIGH (ref 0.0–38.6)

## 2024-07-15 ENCOUNTER — Ambulatory Visit: Payer: Medicare Other | Admitting: Hematology and Oncology

## 2024-07-15 ENCOUNTER — Other Ambulatory Visit: Payer: Medicare Other

## 2024-07-20 ENCOUNTER — Other Ambulatory Visit: Payer: Self-pay

## 2024-07-20 ENCOUNTER — Encounter (INDEPENDENT_AMBULATORY_CARE_PROVIDER_SITE_OTHER): Payer: Self-pay

## 2024-07-21 ENCOUNTER — Other Ambulatory Visit: Payer: Self-pay

## 2024-07-21 NOTE — Progress Notes (Signed)
 Specialty Pharmacy Refill Coordination Note  Barbara Parks is a 73 y.o. female contacted today regarding refills of specialty medication(s) Capecitabine  (XELODA )   Patient requested Delivery   Delivery date: 07/27/24   Verified address: [address], [place], [place]  [address]   Medication will be filled on: 07/26/24

## 2024-07-26 ENCOUNTER — Other Ambulatory Visit: Payer: Self-pay

## 2024-07-26 ENCOUNTER — Ambulatory Visit (HOSPITAL_COMMUNITY)

## 2024-08-02 NOTE — Progress Notes (Signed)
 " [facility]        Telephone: [phone]?Fax: [phone]   Oncology Clinical Pharmacist Practitioner Progress Note   Barbara Parks was contacted via in-person visit to discuss her chemotherapy regimen for capecitabine  which they receive under the care of Dr. Vinay Gudena.   Current treatment regimen and start date Capecitabine  (06/18/24) Denosumab  120 mg (03/16/24)  Interval History She continues on capecitabine  2 tablets (1000 mg) in the morning and 2 tablets (1000 mg) in the evening on days 1 to 14 of a 21-day cycle. This is being given  in combination with denosumab  120 mg. Therapy is planned to continue until disease progression or unacceptable toxicity.   She was last seen by clinical pharmacy on 06/15/24 and Dr. Odean on 07/12/24.  She started her last cycle on Friday 07/30/24.  Response to Therapy Alk Phos has increased to 871 U/L which is 6.91 x ULN (Grade 3 toxicity). Capecitabine  guidelines say to hold for grade 3 toxicity and reduce to 75% of starting dose once resolved to at least Grade 1. This has been relayed to patient and she will see Dr. Odean with port labs next week on 08/09/24 which is already scheduled. He will likely order restaging scans at that time as well.    AST is grade 1, sodium just below LLN. Will need to monitor leukocytosis. Barbara Parks says she feels fine, afebrile. Increased heart rate today but says she is in some pain due to thoracentesis planned that she had to cancel twice.  We contacted scheduling while she was here and she now has it scheduled for 08/16/24 at 2 pm. She knows to contact Dr. Gudena if symptoms worsen prior to seeing him again with labs on 08/09/24.  Labs, vitals, treatment parameters, and manufacturer guidelines assessing toxicity were reviewed with Barbara Parks today. Based on these values, patient is in agreement to HOLD therapy at this time.  Allergies Allergies[1]  Vitals    07/12/2024   10:34  AM 07/12/2024   10:00 AM 06/30/2024    3:21 PM  Oncology Vitals  Height  166 cm   Weight  66.18 kg   Weight (lbs)  145 lbs 14 oz   BMI  23.91 kg/m2   Temp  98.3 F (36.8 C) 97.8 F (36.6 C)  Pulse Rate  93 89  BP 99/56 90/50 100/61  Resp  18 15  SpO2  100 % 99 %  BSA (m2)  1.75 m2     Laboratory Data    Latest Ref Rng & Units 08/03/2024   10:25 AM 07/12/2024    9:47 AM 06/30/2024   10:35 AM  CBC EXTENDED  WBC 4.0 - 10.5 K/uL 17.7  16.5  17.0   RBC 3.87 - 5.11 MIL/uL 4.30  4.21  4.93   Hemoglobin 12.0 - 15.0 g/dL 86.6  87.5  85.7   HCT 36.0 - 46.0 % 40.5  39.5  45.0   Platelets 150 - 400 K/uL 302  246  457   NEUT# 1.7 - 7.7 K/uL 14.1  12.7  13.9   Lymph# 0.7 - 4.0 K/uL 0.6  0.6  0.8        Latest Ref Rng & Units 08/03/2024   10:25 AM 07/12/2024    9:47 AM 06/30/2024   10:28 AM  CMP  Glucose 70 - 99 mg/dL 886  862  807   BUN 8 - 23 mg/dL 17  15  16    Creatinine 0.44 -  1.00 mg/dL 9.20  9.11  9.05   Sodium 135 - 145 mmol/L 134  136  136   Potassium 3.5 - 5.1 mmol/L 3.7  3.8  4.6   Chloride 98 - 111 mmol/L 99  104  101   CO2 22 - 32 mmol/L 17  17  12    Calcium  8.9 - 10.3 mg/dL 9.3  8.7  9.7   Total Protein 6.5 - 8.1 g/dL 7.3  6.8  7.8   Total Bilirubin 0.0 - 1.2 mg/dL 1.0  0.7  0.8   Alkaline Phos 38 - 126 U/L 871  436  349   AST 15 - 41 U/L 66  52  52   ALT 0 - 44 U/L 16  10  9      No results found for: MG Lab Results  Component Value Date   CA2729 449.7 (H) 07/12/2024   CA2729 354.5 (H) 06/08/2024   CA2729 442.7 (H) 05/10/2024     Adverse Effects Assessment Alk Phos: as above, holding for grade 3 toxicity AST: as above, grade 1 toxicity Hyponatremia: just below LLN. Monitor Leukocytosis: monitor  Adherence Assessment Barbara Parks reports missing 0 doses over the past 4 weeks.   Reason for missed dose: n/a Patient was re-educated on importance of adherence.   Access Assessment Barbara Parks is currently receiving her capecitabine  through  [facility] concerns:  none  Medication Reconciliation The patient's medication list was reviewed today with the patient? Yes New medications or herbal supplements have recently been started? No  Any medications have been discontinued? No  The medication list was updated and reconciled based on the patient's most recent medication list in the electronic medical record (EMR) including herbal products and OTC medications.   Medications Current Outpatient Medications  Medication Sig Dispense Refill   albuterol  (VENTOLIN  HFA) 108 (90 Base) MCG/ACT inhaler Inhale 2 puffs into the lungs every 6 (six) hours as needed for wheezing or shortness of breath. 8 g 6   capecitabine  (XELODA ) 500 MG tablet Take 2 tablets (1,000 mg total) by mouth 2 (two) times daily after a meal. Take for 14 days and then off for 7 days 56 tablet 6   carvedilol  (COREG ) 3.125 MG tablet Take 1 tablet (3.125 mg total) by mouth 2 (two) times daily with a meal. 180 tablet 3   cetirizine (ZYRTEC) 10 MG tablet Take 10 mg by mouth daily.     cholecalciferol (VITAMIN D3) 25 MCG (1000 UT) tablet Take 2 tablets (2,000 Units total) by mouth daily. 180 tablet 4   cyclobenzaprine  (FLEXERIL ) 10 MG tablet Take 1 tablet (10 mg total) by mouth 3 (three) times daily as needed for muscle spasms. 60 tablet 0   gemfibrozil  (LOPID ) 600 MG tablet Take 1 tablet (600 mg total) by mouth 2 (two) times daily before a meal. 180 tablet 2   lidocaine -prilocaine  (EMLA ) cream Apply 1 Application topically as needed. Apply 1 dose as needed for port a cath access 30 g 5   loperamide  (IMODIUM ) 2 MG capsule Take 2 mg by mouth 4 (four) times daily as needed for diarrhea or loose stools. Reported on 11/30/2015     prochlorperazine  (COMPAZINE ) 10 MG tablet Take 1 tablet (10 mg total) by mouth every 6 (six) hours as needed for nausea or vomiting. 30 tablet 3   traMADol  (ULTRAM ) 50 MG tablet Take 1-2 tablets (50-100 mg total) by mouth every  6 (six) hours as needed. 45 tablet 0  No current facility-administered medications for this visit.    Drug-Drug Interactions (DDIs) DDIs were evaluated? Yes Significant DDIs? No  The patient was instructed to speak with their health care provider and/or the oral chemotherapy pharmacist before starting any new drug, including prescription or over the counter, natural / herbal products, or vitamins.  Supportive Care Fever: reviewed the importance of having a thermometer and the Centers for Disease Control and Prevention (CDC) definition of fever which is 100.80F (38C) or higher. Patient should call 24/7 triage at [phone] if experiencing a fever or any other symptoms Decreased white blood cells (WBCs) and increased risk for infection Decreased hemoglobin, part of the red blood cells that carry iron and oxygen Diarrhea Mouth irritation or sores Pain or discomfort in hands and/or feet Changes in liver function Nausea or vomiting Fatigue Abdominal pain Decreased platelet count and increased risk of bleeding Interactions with warfarin Cardiotoxicity Kidney injury Dehydration Alopecia Handling body fluids and waste Pregnancy, sexual activity, and contraception Storage and Handling To be taken within 30 minutes of a meal Missed doses  Dosing Assessment Hepatic adjustments needed? Yes  Renal adjustments needed? No  Toxicity adjustments needed? No  The current dosing regimen is not appropriate to continue at this time.  Follow-Up Plan HOLD capecitabine  2 tablets (1000 mg total) by mouth every 12 hours for 14 days, followed by a 7-day rest period. Repeat every 21 days. This is due to Grade 3 Alk Phos elevations. Manufacturer recommends a 25% dose reduction once resolved to Grade 1. Continue denosumab  120 mg SubQ every 12 weeks, last given 06/08/24, next due 08/31/24 (scheduled for 09/08/24) Monitor for side effects: Alk Phos, AST, sodium, WBC Will send visit note from today to  Dr. Gudena so he can review scans from 06/30/24 that were done during ED visit. Appear to show liver/lung progression. Tumor markers also elevated. She is following with palliative care and will see them on 08/09/24 Port flush with labs, Dr. Odean visit on 08/09/24 -- resume if Alk Phos has lowered to Grade 1 toxicity (< 315 U/L); AST is Grade 1 (66 U/L). Will add pharmacy clinic visit to port flush lab and denosumab  injection on 09/08/24.  Next cycle due to start 08/21/24, if continuing, labs should be planned during her off week prior to start of next cycle. Should ideally be seen prior to the start of each capecitabine  cycle or as clinically indicated Barbara Parks can follow up with clinical pharmacy as deemed necessary by Dr. Mackey Odean going forward   Barbara Parks participated in the discussion, expressed understanding, and voiced agreement with the above plan. All questions were answered to her satisfaction. The patient was advised to contact the clinic at [phone] with any questions or concerns prior to her return visit.   I spent 30 minutes assessing and educating the patient.  Barbara Parks, PharmD, BCOP, CPP  Barbara Parks, RPH-CPP, 08/03/2024  12:04 PM   **Disclaimer: This note was dictated with voice recognition software. Similar sounding words can inadvertently be transcribed and this note may contain transcription errors which may not have been corrected upon publication of note.**      [1]  Allergies Allergen Reactions   Aspirin     Upset stomach   Latex Other (See Comments)    Blistering and skin peels off   Nsaids Nausea And Vomiting    Extreme nausea and vomiting   "

## 2024-08-03 ENCOUNTER — Inpatient Hospital Stay: Attending: Hematology and Oncology

## 2024-08-03 ENCOUNTER — Inpatient Hospital Stay: Admitting: Pharmacist

## 2024-08-03 DIAGNOSIS — G893 Neoplasm related pain (acute) (chronic): Secondary | ICD-10-CM | POA: Diagnosis not present

## 2024-08-03 DIAGNOSIS — Z79899 Other long term (current) drug therapy: Secondary | ICD-10-CM | POA: Insufficient documentation

## 2024-08-03 DIAGNOSIS — C50911 Malignant neoplasm of unspecified site of right female breast: Secondary | ICD-10-CM

## 2024-08-03 DIAGNOSIS — C50411 Malignant neoplasm of upper-outer quadrant of right female breast: Secondary | ICD-10-CM | POA: Insufficient documentation

## 2024-08-03 DIAGNOSIS — Z170 Estrogen receptor positive status [ER+]: Secondary | ICD-10-CM | POA: Insufficient documentation

## 2024-08-03 DIAGNOSIS — C7951 Secondary malignant neoplasm of bone: Secondary | ICD-10-CM | POA: Diagnosis present

## 2024-08-03 DIAGNOSIS — Z7901 Long term (current) use of anticoagulants: Secondary | ICD-10-CM | POA: Insufficient documentation

## 2024-08-03 DIAGNOSIS — C787 Secondary malignant neoplasm of liver and intrahepatic bile duct: Secondary | ICD-10-CM | POA: Insufficient documentation

## 2024-08-03 DIAGNOSIS — R630 Anorexia: Secondary | ICD-10-CM | POA: Insufficient documentation

## 2024-08-03 DIAGNOSIS — R748 Abnormal levels of other serum enzymes: Secondary | ICD-10-CM | POA: Diagnosis not present

## 2024-08-03 DIAGNOSIS — J910 Malignant pleural effusion: Secondary | ICD-10-CM | POA: Diagnosis not present

## 2024-08-03 DIAGNOSIS — C7800 Secondary malignant neoplasm of unspecified lung: Secondary | ICD-10-CM | POA: Insufficient documentation

## 2024-08-03 DIAGNOSIS — R530 Neoplastic (malignant) related fatigue: Secondary | ICD-10-CM | POA: Insufficient documentation

## 2024-08-03 DIAGNOSIS — R0602 Shortness of breath: Secondary | ICD-10-CM | POA: Diagnosis not present

## 2024-08-03 LAB — CMP (CANCER CENTER ONLY)
ALT: 16 U/L (ref 0–44)
AST: 66 U/L — ABNORMAL HIGH (ref 15–41)
Albumin: 3.9 g/dL (ref 3.5–5.0)
Alkaline Phosphatase: 871 U/L — ABNORMAL HIGH (ref 38–126)
Anion gap: 19 — ABNORMAL HIGH (ref 5–15)
BUN: 17 mg/dL (ref 8–23)
CO2: 17 mmol/L — ABNORMAL LOW (ref 22–32)
Calcium: 9.3 mg/dL (ref 8.9–10.3)
Chloride: 99 mmol/L (ref 98–111)
Creatinine: 0.79 mg/dL (ref 0.44–1.00)
GFR, Estimated: >60 mL/min
Glucose, Bld: 113 mg/dL — ABNORMAL HIGH (ref 70–99)
Potassium: 3.7 mmol/L (ref 3.5–5.1)
Sodium: 134 mmol/L — ABNORMAL LOW (ref 135–145)
Total Bilirubin: 1 mg/dL (ref 0.0–1.2)
Total Protein: 7.3 g/dL (ref 6.5–8.1)

## 2024-08-03 LAB — CBC WITH DIFFERENTIAL (CANCER CENTER ONLY)
Abs Immature Granulocytes: 0.11 K/uL — ABNORMAL HIGH (ref 0.00–0.07)
Basophils Absolute: 0.2 K/uL — ABNORMAL HIGH (ref 0.0–0.1)
Basophils Relative: 1 %
Eosinophils Absolute: 0.9 K/uL — ABNORMAL HIGH (ref 0.0–0.5)
Eosinophils Relative: 5 %
HCT: 40.5 % (ref 36.0–46.0)
Hemoglobin: 13.3 g/dL (ref 12.0–15.0)
Immature Granulocytes: 1 %
Lymphocytes Relative: 4 %
Lymphs Abs: 0.6 K/uL — ABNORMAL LOW (ref 0.7–4.0)
MCH: 30.9 pg (ref 26.0–34.0)
MCHC: 32.8 g/dL (ref 30.0–36.0)
MCV: 94.2 fL (ref 80.0–100.0)
Monocytes Absolute: 1.8 K/uL — ABNORMAL HIGH (ref 0.1–1.0)
Monocytes Relative: 10 %
Neutro Abs: 14.1 K/uL — ABNORMAL HIGH (ref 1.7–7.7)
Neutrophils Relative %: 79 %
Platelet Count: 302 K/uL (ref 150–400)
RBC: 4.3 MIL/uL (ref 3.87–5.11)
RDW: 21.2 % — ABNORMAL HIGH (ref 11.5–15.5)
WBC Count: 17.7 K/uL — ABNORMAL HIGH (ref 4.0–10.5)
nRBC: 0 % (ref 0.0–0.2)

## 2024-08-04 LAB — CANCER ANTIGEN 27.29: CA 27.29: 489.5 U/mL — ABNORMAL HIGH (ref 0.0–38.6)

## 2024-08-09 ENCOUNTER — Inpatient Hospital Stay

## 2024-08-09 ENCOUNTER — Inpatient Hospital Stay (HOSPITAL_BASED_OUTPATIENT_CLINIC_OR_DEPARTMENT_OTHER): Admitting: Nurse Practitioner

## 2024-08-09 ENCOUNTER — Encounter: Payer: Self-pay | Admitting: Nurse Practitioner

## 2024-08-09 ENCOUNTER — Other Ambulatory Visit: Payer: Self-pay

## 2024-08-09 ENCOUNTER — Inpatient Hospital Stay: Admitting: Hematology and Oncology

## 2024-08-09 VITALS — BP 92/58 | HR 114 | Temp 97.3°F | Resp 16 | Wt 141.9 lb

## 2024-08-09 VITALS — BP 76/55 | HR 118

## 2024-08-09 DIAGNOSIS — Z515 Encounter for palliative care: Secondary | ICD-10-CM

## 2024-08-09 DIAGNOSIS — G893 Neoplasm related pain (acute) (chronic): Secondary | ICD-10-CM

## 2024-08-09 DIAGNOSIS — C50411 Malignant neoplasm of upper-outer quadrant of right female breast: Secondary | ICD-10-CM | POA: Diagnosis not present

## 2024-08-09 DIAGNOSIS — C50911 Malignant neoplasm of unspecified site of right female breast: Secondary | ICD-10-CM

## 2024-08-09 DIAGNOSIS — R630 Anorexia: Secondary | ICD-10-CM | POA: Diagnosis not present

## 2024-08-09 DIAGNOSIS — R530 Neoplastic (malignant) related fatigue: Secondary | ICD-10-CM | POA: Diagnosis not present

## 2024-08-09 DIAGNOSIS — Z87891 Personal history of nicotine dependence: Secondary | ICD-10-CM

## 2024-08-09 DIAGNOSIS — R634 Abnormal weight loss: Secondary | ICD-10-CM | POA: Diagnosis not present

## 2024-08-09 DIAGNOSIS — C7800 Secondary malignant neoplasm of unspecified lung: Secondary | ICD-10-CM

## 2024-08-09 DIAGNOSIS — I959 Hypotension, unspecified: Secondary | ICD-10-CM | POA: Diagnosis not present

## 2024-08-09 DIAGNOSIS — C7951 Secondary malignant neoplasm of bone: Secondary | ICD-10-CM

## 2024-08-09 LAB — CMP (CANCER CENTER ONLY)
ALT: 14 U/L (ref 0–44)
AST: 78 U/L — ABNORMAL HIGH (ref 15–41)
Albumin: 3.6 g/dL (ref 3.5–5.0)
Alkaline Phosphatase: 902 U/L — ABNORMAL HIGH (ref 38–126)
Anion gap: 22 — ABNORMAL HIGH (ref 5–15)
BUN: 14 mg/dL (ref 8–23)
CO2: 15 mmol/L — ABNORMAL LOW (ref 22–32)
Calcium: 8.4 mg/dL — ABNORMAL LOW (ref 8.9–10.3)
Chloride: 97 mmol/L — ABNORMAL LOW (ref 98–111)
Creatinine: 0.91 mg/dL (ref 0.44–1.00)
GFR, Estimated: >60 mL/min
Glucose, Bld: 137 mg/dL — ABNORMAL HIGH (ref 70–99)
Potassium: 3.8 mmol/L (ref 3.5–5.1)
Sodium: 133 mmol/L — ABNORMAL LOW (ref 135–145)
Total Bilirubin: 1 mg/dL (ref 0.0–1.2)
Total Protein: 6.9 g/dL (ref 6.5–8.1)

## 2024-08-09 LAB — CBC WITH DIFFERENTIAL (CANCER CENTER ONLY)
Abs Immature Granulocytes: 0.21 K/uL — ABNORMAL HIGH (ref 0.00–0.07)
Basophils Absolute: 0.2 K/uL — ABNORMAL HIGH (ref 0.0–0.1)
Basophils Relative: 1 %
Eosinophils Absolute: 0.7 K/uL — ABNORMAL HIGH (ref 0.0–0.5)
Eosinophils Relative: 4 %
HCT: 40.3 % (ref 36.0–46.0)
Hemoglobin: 12.9 g/dL (ref 12.0–15.0)
Immature Granulocytes: 1 %
Lymphocytes Relative: 3 %
Lymphs Abs: 0.6 K/uL — ABNORMAL LOW (ref 0.7–4.0)
MCH: 30.3 pg (ref 26.0–34.0)
MCHC: 32 g/dL (ref 30.0–36.0)
MCV: 94.6 fL (ref 80.0–100.0)
Monocytes Absolute: 2.6 K/uL — ABNORMAL HIGH (ref 0.1–1.0)
Monocytes Relative: 14 %
Neutro Abs: 14 K/uL — ABNORMAL HIGH (ref 1.7–7.7)
Neutrophils Relative %: 77 %
Platelet Count: 409 K/uL — ABNORMAL HIGH (ref 150–400)
RBC: 4.26 MIL/uL (ref 3.87–5.11)
RDW: 21.2 % — ABNORMAL HIGH (ref 11.5–15.5)
WBC Count: 18.2 K/uL — ABNORMAL HIGH (ref 4.0–10.5)
nRBC: 0 % (ref 0.0–0.2)

## 2024-08-09 MED ORDER — TRAMADOL HCL 50 MG PO TABS: 50.0000 mg | ORAL_TABLET | Freq: Four times a day (QID) | ORAL | 0 refills | Status: DC | PRN

## 2024-08-09 MED ORDER — CAPECITABINE 500 MG PO TABS
625.0000 mg/m2 | ORAL_TABLET | Freq: Two times a day (BID) | ORAL | 6 refills | Status: DC
  Filled 2024-08-09: qty 56, 14d supply, fill #0
  Filled 2024-08-23: qty 56, 21d supply, fill #0

## 2024-08-09 NOTE — Progress Notes (Signed)
 "    Palliative Medicine [facility]  [phone] [phone]   Name: Almas Rake [facility] Date: 08/09/2024 MRN: [id number]  DOB: [birth year]  Patient Care Team: Dyane Anthony RAMAN, FNP as PCP - General (Family Medicine) Alvan Ronal BRAVO, MD (Inactive) as PCP - Cardiology (Cardiology) Jeffrie Oneil BROCKS, MD (Cardiology) Lucas Dorise POUR, MD (Cardiothoracic Surgery) Lucila Norleen LABOR, RPH-CPP (Pharmacist) Dannielle Alm BRAVO, MD as Referring Physician (Radiation Oncology) Odean Potts, MD as Consulting Physician (Hematology and Oncology) Pickenpack-Cousar, Fannie SAILOR, NP as Nurse Practitioner (Hospice and Palliative Medicine)    INTERVAL HISTORY: Dondi Burandt is a 74 y.o. female with oncologic medical history including metastatic breast cancer (07/2019) with liver involvement.  Palliative is seeing patient for symptom management and goals of care.   SOCIAL HISTORY:     reports that she quit smoking about 16 years ago. Her smoking use included cigarettes. She started smoking about 46 years ago. She has a 45 pack-year smoking history. She has never used smokeless tobacco. She reports that she does not drink alcohol and does not use drugs.  ADVANCE DIRECTIVES:  None on file. Patient reports she has a documented advanced directive. States her close friend/neighbor Merlynn Kerns is her primary medical decision-maker and support of her sister, Meade Gave who lives out of state.   CODE STATUS: Full code  PAST MEDICAL HISTORY: Past Medical History:  Diagnosis Date   Arthritis    left hip   Asthma    breast ca 2005   breast/chemo R mastectomy   Breast cancer (HCC)    Dermatitis    Diabetes mellitus without complication (HCC)    GERD (gastroesophageal reflux disease)    Hypercholesteremia    Metastasis to lung (HCC) dx'd 08/2011   Osteopenia due to cancer therapy 09/09/2013   Palpitations    Personal history of chemotherapy    Shortness of breath      ALLERGIES:  is allergic to aspirin, latex, and nsaids.  MEDICATIONS:  Current Outpatient Medications  Medication Sig Dispense Refill   albuterol  (VENTOLIN  HFA) 108 (90 Base) MCG/ACT inhaler Inhale 2 puffs into the lungs every 6 (six) hours as needed for wheezing or shortness of breath. 8 g 6   capecitabine  (XELODA ) 500 MG tablet Take 2 tablets (1,000 mg total) by mouth 2 (two) times daily after a meal. 2 tabs in Am and 1 tab in PM. Take for 14 days and then off for 7 days 56 tablet 6   carvedilol  (COREG ) 3.125 MG tablet Take 1 tablet (3.125 mg total) by mouth 2 (two) times daily with a meal. 180 tablet 3   cetirizine (ZYRTEC) 10 MG tablet Take 10 mg by mouth daily.     cholecalciferol (VITAMIN D3) 25 MCG (1000 UT) tablet Take 2 tablets (2,000 Units total) by mouth daily. 180 tablet 4   cyclobenzaprine  (FLEXERIL ) 10 MG tablet Take 1 tablet (10 mg total) by mouth 3 (three) times daily as needed for muscle spasms. 60 tablet 0   gemfibrozil  (LOPID ) 600 MG tablet Take 1 tablet (600 mg total) by mouth 2 (two) times daily before a meal. 180 tablet 2   lidocaine -prilocaine  (EMLA ) cream Apply 1 Application topically as needed. Apply 1 dose as needed for port a cath access 30 g 5   loperamide  (IMODIUM ) 2 MG capsule Take 2 mg by mouth 4 (four) times daily as needed for diarrhea or loose stools. Reported on 11/30/2015     prochlorperazine  (COMPAZINE ) 10 MG tablet Take  1 tablet (10 mg total) by mouth every 6 (six) hours as needed for nausea or vomiting. 30 tablet 3   traMADol  (ULTRAM ) 50 MG tablet Take 1-2 tablets (50-100 mg total) by mouth every 6 (six) hours as needed. 60 tablet 0   No current facility-administered medications for this visit.    VITAL SIGNS: BP (!) (P) 88/61 (BP Location: Left Arm)   Pulse (P) 98  There were no vitals filed for this visit.   Estimated body mass index is 23.25 kg/m as calculated from the following:   Height as of 07/12/24: 5' 5.5 (1.664 m).   Weight as of an  earlier encounter on 08/09/24: 141 lb 14.4 oz (64.4 kg).   PERFORMANCE STATUS (ECOG) : 2 - Symptomatic, <50% confined to bed  Physical Exam General: NAD Cardiovascular: Tachycardic  Pulmonary: Normal breathing pattern  Extremities: no edema, no joint deformities Skin: no rashes Neurological: AAO x3  IMPRESSION: Discussed the use of AI scribe software for clinical note transcription with the patient, who gave verbal consent to proceed.  History of Present Illness Kaysi Ourada is a 74 year old female with metastatic breast cancer who presents to clinic for follow-up. She is complaining of significant shortness of breath and fatigue. Dyspneic with minimal exertion. She is in a wheelchair due to her shortness of breath. This is a change in her previous episodes stating 'much worse and worrisome. She is anxiously awaiting results of CT scan on yesterday. She is seeing Dr. Gudena today after our visit.   She experiences significant shortness of breath that limits her ability to walk any distance or perform household activities. She becomes extremely short of breath with exertion, such as vacuuming her small living room, which took her two hours to complete. She also notes a recent development of a bad cough over the weekend. No chest pain, but she reports chest tightness, describing it as if she has stuff in there and it won't come up. She has been sleeping in a recliner for eight years, which she finds helps her breathe easier. She expresses difficulty in getting enough oxygen. Does not desire to have to maintain quality of life by sitting around and relying on sleeping upright.   Discussed most likely in need of further evaluation and interventions such as thoracentesis. Given she is scheduled to see her oncologist today will defer interventions and next steps.   Ms. Cordoba reports a decrease in appetite, stating that she has been making smaller helpings of food and often does not want  more after finishing. Her weight has decreased from 147 pounds on November 12th to 144 pounds today. We discussed continuing to focus on small frequent meals and snacking as desired. Also to include high protein foods.   Blood pressure is on the softer side today at 105/56. HR 104. She attributes this to medication.   I will plan to continue close follow-up and offer support in collaboration to oncology team. All questions answered and support provided.    Jahne Krukowski Florio is a 74 year old female with metastatic breast cancer who presents to clinic for symptom management follow-up. No acute distress noted. Denies concerns for uncontrolled nausea, vomiting, constipation or diarrhea.   She experiences bloating and a reduced ability to eat large meals, although her appetite is generally good. She attributes the bloating to her liver condition and tries to snack between meals to maintain nutrition. Her weight is down to 141lbs from 145lbs on December 15th. She is looking forward  to her sister coming to visit in another week. 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   She experiences fatigue and notes her blood pressure is lower than usual, despite increasing her water intake to at least 100 ounces a day using a Brita filter. We discussed concerns for her hypotension. Blood pressure rechecked with reading slightly higher at 88/61. Discussed IVF while in clinic today for support however patient declines. Education provided on etiology and support. She verbalized understanding however would prefer to continue monitoring in the home confirming she has a blood pressure cuff. Again references her sister is coming in to stay with her to assist with daily activities and ensure she is eating well, as she sometimes struggles to prepare meals herself. Patient is aware when to contact office or seek emergent assistance. She verbalized  understanding.   Delonna reports she has temporarily stopped her oral cancer medications due to elevated liver function tests, specifically alkaline phosphatase, and is awaiting further instructions from her oncologist. Her sodium level is 133, alk phos up to 902, AST 78. We reviewed upcoming appointments. She has a scheduled thoracentesis on January 19th.    We discussed her pain and discomfort at length. She reports severe pain originating from her liver, which led to an emergency room visit several weeks prior. Tramadol  provides relief, which she takes primarily at night and occasionally during the day if the pain is severe. She requests a refill of her tramadol  prescription to be picked up at [facility] as requested. No adjustments at this time.   All questions answered and support provided.   Assessment & Plan Metastatic right breast cancer Liver involvement causing bloating and pain. Recent cessation of cancer medications due to elevated alkaline phosphatase levels, awaiting further results per patient.  - Resume cancer medications as per oncologist's guidance.  Neoplasm related pain Severe pain related to liver metastasis, previously managed with tramadol , especially at night. - Prescribed tramadol  50mg  as needed for pain management, to be picked up from [facility] on [address].  Palliative care management Focus on symptom management and quality of life. Scheduled for thoracentesis on January 19th to manage fluid accumulation. Sister's arrival to assist with daily activities and nutrition. - Proceed with thoracentesis on January 19th as scheduled. - Ensure sister assists with daily activities and nutrition.  Hypotension Blood pressure lower than usual, possibly due to dehydration or inadequate nutrition. Reports increased water intake but reduced food consumption. - Monitor blood pressure at home. - Consider IV fluids if blood pressure remains low. IVF offered and recommended during  visit today however patient declined.  - Contact the office if blood pressure does not improve.  Decreased appetite/Weight Loss  Reduced food intake leading to weight loss and fatigue. Sister's arrival expected to improve nutritional intake. - Encouraged increased protein intake with sister's assistance. - Monitor weight and nutritional status.  Follow-Up I will plan to see patient back in 3-4 weeks. Sooner if needed.   Patient expressed understanding and was in agreement with this plan. She also understands that She can call the clinic at any time with any questions, concerns, or complaints.   Any controlled substances utilized were prescribed in the context of palliative care. PDMP has been reviewed.   I personally spent a total of 40 minutes in the care of the patient today including preparing to see the patient, getting/reviewing separately obtained history, performing a medically  appropriate exam/evaluation, counseling and educating, placing orders, documenting clinical information in the EHR, independently interpreting results, and communicating results. Visit consisted of counseling and education dealing with the complex and emotionally intense issues of symptom management and palliative care in the setting of serious and potentially life-threatening illness.  Levon Borer, AGPCNP-BC  Palliative Medicine Team/[facility]    "

## 2024-08-09 NOTE — Assessment & Plan Note (Signed)
 Metastatic breast cancer (pericardial effusion, pleural effusion, right middle lobe lung lesion, ER 91%, PR 90%, Ki-67 35%, HER2 negative diagnosed February 2023)   (Prior history of right mastectomy 2005, adjuvant chemo with Adriamycin Cytoxan and Taxol followed by tamoxifen)   Current treatment:  1. Ribociclib , fulvestrant , Xgeva , ribociclib  discontinued 10/18/2021  2. abemaciclib  started 10/27/2021 3. Enhertu  discontinued for progression after 16 cycles on 06/06/2023 4.  Elacestrant  06/18/2023-03/16/2024 5. Capivasertib  started 04/13/2024-05/15/2024 discontinued for rash and diarrhea    Foundation 1 in 2021: MSI stable, TMB 1, ESR 1 mutation, PTEN, FGF 10, RICTOR amplifications   Guardant360 01/14/2022: No actionable mutations.  ESR 1 mutation. Liver biopsy 02/26/2022: Metastatic carcinoma consistent with breast primary. Caris mutation testing 02/26/2022: HER2 low, ESR 1 mutation, PTEN She has a puppy named [name]).    01/23/2024: CT CAP: Moderate loculated right pleural effusion, stable liver lesions, stable bone metastasis 03/05/2024: MRI liver: Mixed response to hepatic metastases some are smaller, few new lesions, mild progression of bone metastases    CA 27-29: 356 on 03/01/2024 (was 149 on 09/15/2023) Port Blood clot: Currently on anticoagulation with Eliquis    Current treatment:  Xeloda  1000 mg p.o. twice daily 14 days on 7 days off started 06/18/2024 Xeloda  toxicities: Tolerating extremely well without any problems or concerns     06/17/2024: CT CAP: Marked progression of hepatic metastases.  Innumerable new lesions.  Index right hepatic lobe lesion 2.9 cm, new pulmonary nodules, widespread bone metastases.  Left adrenal adenoma   Shortness of breath: Thoracentesis  CA 27-29: 08/03/2024: 489.5 (was 449.7 on 07/12/2024)

## 2024-08-09 NOTE — Progress Notes (Signed)
 "  Patient Care Team: Dyane Anthony RAMAN, FNP as PCP - General (Family Medicine) Alvan Ronal BRAVO, MD (Inactive) as PCP - Cardiology (Cardiology) Jeffrie Oneil BROCKS, MD (Cardiology) Lucas Dorise POUR, MD (Cardiothoracic Surgery) Lucila Norleen LABOR, RPH-CPP (Pharmacist) Dannielle Alm BRAVO, MD as Referring Physician (Radiation Oncology) Odean Potts, MD as Consulting Physician (Hematology and Oncology) Pickenpack-Cousar, Fannie SAILOR, NP as Nurse Practitioner [facility] and Palliative Medicine)  DIAGNOSIS:  Encounter Diagnosis  Name Primary?   Carcinoma of right breast metastatic to lung (HCC) Yes    SUMMARY OF ONCOLOGIC HISTORY: Oncology History  Carcinoma of right breast metastatic to lung (HCC)  10/10/2011 Initial Diagnosis   Carcinoma of right breast metastatic to lung (HCC)   05/02/2022 - 06/06/2023 Chemotherapy   Patient is on Treatment Plan : BREAST METASTATIC Fam-Trastuzumab Deruxtecan-nxki  (Enhertu ) (5.4) q21d     Malignant neoplasm of upper-outer quadrant of right breast in female, estrogen receptor positive (HCC)  03/22/2004 Initial Diagnosis   right mastectomy and sentinel lymph node sampling 03/22/2004 for a right upper-outer quadrant pT2 pN1, stage IIB invasive ductal carcinoma with lobular features, grade 2, estrogen receptor and progesterone receptor positive, HER-2 negative, with an MIB-1 of 9%   05/2004 - 09/18/2004 Chemotherapy   adjuvant chemotherapy with dose dense doxorubicin and cyclophosphamide x4 cycles (first cycle delayed one week) followed by dose dense paclitaxel x4 was completed 09/18/2004   09/2004 - 2009 Anti-estrogen oral therapy   tamoxifen started March 2006, discontinued 2009   08/2011 Relapse/Recurrence   Metastatic disease:large pericardial effusion, large right pleural effusion and possible right middle lobe bronchial obstruction  status post pericardial window placement, fiberoptic bronchoscopy and right Pleurx placement 09/11/2011, with biopsy of the bronchus  intermedius and pericardium positive for metastatic breast cancer, ER 91%, PR 100% p with an MIB-1 of 35%, and no HER-2 amplification, ratio being 1.37   10/10/2011 - 07/2019 Anti-estrogen oral therapy   BOLERO-4 trial 10/10/2011, receiving letrozole  and everolimus  (stopped for progression)   08/16/2019 Miscellaneous   foundation 1 requested on liver biopsy from 08/16/2019 shows a stable microsatellite status with 1 mutation/MB, amplification of C11orf30, FGF10 and RICTOR, with mutations in ESR1 and PTEN   08/16/2019 Miscellaneous   Liver biopsy: Metastatic carcinoma to the liver consistent with breast primary, ER 100%, PR 100%, Ki-67 5%, HER2 2+ by IHC, FISH HER2 negative   08/24/2019 -  Anti-estrogen oral therapy   fulvestrant  started 08/24/2019, repeated every 4 weeks Palbociclib  125 mg daily 21 days on 7 days off started 08/25/2019 Switched to ribociclib  s starting 08/22/2020   02/28/2021 -  Radiation Therapy   palliative radiation (Dr. Dannielle in [place]) 02/28/2021 to right hip area   10/27/2021 -  Anti-estrogen oral therapy   Abemaciclib  with fulvestrant    01/14/2022 Miscellaneous   Guardant360: ESR 1 mutation (no other mutations) MSI high was not detected, PTEN, T p53, EGFR amplification, RB1 mutations detected without any approved treatment options   Malignant neoplasm metastatic to liver (HCC)  08/09/2019 Initial Diagnosis   Malignant neoplasm metastatic to liver (HCC)     CHIEF COMPLIANT: Follow-up of metastatic breast cancer on Xeloda  (on hold)  HISTORY OF PRESENT ILLNESS:  History of Present Illness Barbara Parks is a 74 year old female with metastatic ER/PR-positive, HER2-negative invasive ductal carcinoma of the right breast who presents for oncology follow-up and symptom management.  Respiratory status is stable with no increase in dyspnea. She has not needed thoracentesis since her prior procedure, and the next is scheduled for January 19.  She  has persistent fatigue  that is worse with increased activity and has been more pronounced today and a few days ago. This morning, after early activity, she had dry heaves. She has anorexia, eats only small amounts at a time, and feels that exertion worsens her fatigue.  Capecitabine  was recently held for significant alkaline phosphatase elevation after only a few weeks of therapy. Her prior dose was two tablets in the morning and two in the evening, occasionally reduced to one tablet in the evening. She is awaiting lab results and instructions on dose adjustment or resumption. She knows her CA 27-29 rose from 450 to 490. No new tumor marker was drawn today.  About two weeks ago, she noticed a tender, mildly painful lump in her hip that has not grown. She has no new or worsening pain in other known sites of disease.  She lives at home and manages basic daily activities with some assistance. Walking her dog is difficult, and friends sometimes help. She can do light chores such as laundry, dishes, and sweeping but finds activity taxing. She requested paperwork for a handicap placard to aid mobility. Her sister is staying with her through at least the end of February and provides support.      ALLERGIES:  is allergic to aspirin, latex, and nsaids.  MEDICATIONS:  Current Outpatient Medications  Medication Sig Dispense Refill   albuterol  (VENTOLIN  HFA) 108 (90 Base) MCG/ACT inhaler Inhale 2 puffs into the lungs every 6 (six) hours as needed for wheezing or shortness of breath. 8 g 6   capecitabine  (XELODA ) 500 MG tablet Take 2 tablets (1,000 mg total) by mouth 2 (two) times daily after a meal. Take for 14 days and then off for 7 days 56 tablet 6   carvedilol  (COREG ) 3.125 MG tablet Take 1 tablet (3.125 mg total) by mouth 2 (two) times daily with a meal. 180 tablet 3   cetirizine (ZYRTEC) 10 MG tablet Take 10 mg by mouth daily.     cholecalciferol (VITAMIN D3) 25 MCG (1000 UT) tablet Take 2 tablets (2,000 Units total) by  mouth daily. 180 tablet 4   cyclobenzaprine  (FLEXERIL ) 10 MG tablet Take 1 tablet (10 mg total) by mouth 3 (three) times daily as needed for muscle spasms. 60 tablet 0   gemfibrozil  (LOPID ) 600 MG tablet Take 1 tablet (600 mg total) by mouth 2 (two) times daily before a meal. 180 tablet 2   lidocaine -prilocaine  (EMLA ) cream Apply 1 Application topically as needed. Apply 1 dose as needed for port a cath access 30 g 5   loperamide  (IMODIUM ) 2 MG capsule Take 2 mg by mouth 4 (four) times daily as needed for diarrhea or loose stools. Reported on 11/30/2015     prochlorperazine  (COMPAZINE ) 10 MG tablet Take 1 tablet (10 mg total) by mouth every 6 (six) hours as needed for nausea or vomiting. 30 tablet 3   traMADol  (ULTRAM ) 50 MG tablet Take 1-2 tablets (50-100 mg total) by mouth every 6 (six) hours as needed. 45 tablet 0   No current facility-administered medications for this visit.    PHYSICAL EXAMINATION: ECOG PERFORMANCE STATUS: 1 - Symptomatic but completely ambulatory  Vitals:   08/09/24 1129  BP: (!) 92/58  Pulse: (!) 114  Resp: 16  Temp: (!) 97.3 F (36.3 C)  SpO2: 97%   Filed Weights   08/09/24 1129  Weight: 141 lb 14.4 oz (64.4 kg)    Physical Exam Lung exam: Decreased breath sounds at the bases  (  exam performed in the presence of a chaperone)  LABORATORY DATA:  I have reviewed the data as listed    Latest Ref Rng & Units 08/03/2024   10:25 AM 07/12/2024    9:47 AM 06/30/2024   10:28 AM  CMP  Glucose 70 - 99 mg/dL 886  862  807   BUN 8 - 23 mg/dL 17  15  16    Creatinine 0.44 - 1.00 mg/dL 9.20  9.11  9.05   Sodium 135 - 145 mmol/L 134  136  136   Potassium 3.5 - 5.1 mmol/L 3.7  3.8  4.6   Chloride 98 - 111 mmol/L 99  104  101   CO2 22 - 32 mmol/L 17  17  12    Calcium  8.9 - 10.3 mg/dL 9.3  8.7  9.7   Total Protein 6.5 - 8.1 g/dL 7.3  6.8  7.8   Total Bilirubin 0.0 - 1.2 mg/dL 1.0  0.7  0.8   Alkaline Phos 38 - 126 U/L 871  436  349   AST 15 - 41 U/L 66  52  52   ALT 0  - 44 U/L 16  10  9      Lab Results  Component Value Date   WBC 18.2 (H) 08/09/2024   HGB 12.9 08/09/2024   HCT 40.3 08/09/2024   MCV 94.6 08/09/2024   PLT 409 (H) 08/09/2024   NEUTROABS 14.0 (H) 08/09/2024    ASSESSMENT & PLAN:  Carcinoma of right breast metastatic to lung [facility]) Metastatic breast cancer (pericardial effusion, pleural effusion, right middle lobe lung lesion, ER 91%, PR 90%, Ki-67 35%, HER2 negative diagnosed February 2023)   (Prior history of right mastectomy 2005, adjuvant chemo with Adriamycin Cytoxan and Taxol followed by tamoxifen)   Current treatment:  1. Ribociclib , fulvestrant , Xgeva , ribociclib  discontinued 10/18/2021  2. abemaciclib  started 10/27/2021 3. Enhertu  discontinued for progression after 16 cycles on 06/06/2023 4.  Elacestrant  06/18/2023-03/16/2024 5. Capivasertib  started 04/13/2024-05/15/2024 discontinued for rash and diarrhea    Foundation 1 in 2021: MSI stable, TMB 1, ESR 1 mutation, PTEN, FGF 10, RICTOR amplifications   Guardant360 01/14/2022: No actionable mutations.  ESR 1 mutation. Liver biopsy 02/26/2022: Metastatic carcinoma consistent with breast primary. Caris mutation testing 02/26/2022: HER2 low, ESR 1 mutation, PTEN She has a puppy named [name]).    01/23/2024: CT CAP: Moderate loculated right pleural effusion, stable liver lesions, stable bone metastasis 03/05/2024: MRI liver: Mixed response to hepatic metastases some are smaller, few new lesions, mild progression of bone metastases    CA 27-29: 356 on 03/01/2024 (was 149 on 09/15/2023) Port Blood clot: Currently on anticoagulation with Eliquis    Current treatment:  Xeloda  1000 mg p.o. twice daily 14 days on 7 days off started 06/18/2024 Xeloda  toxicities: Tolerating extremely well without any problems or concerns     06/17/2024: CT CAP: Marked progression of hepatic metastases.  Innumerable new lesions.  Index right hepatic lobe lesion 2.9 cm, new pulmonary nodules, widespread bone  metastases.  Left adrenal adenoma   Shortness of breath: Thoracentesis scheduled for 08/16/2024  CA 27-29: 08/03/2024: 489.5 (was 449.7 on 07/12/2024) Elevated alkaline phosphatase:: We will reduce the dosage of Xeloda  to 2 tablets in the morning 1 tablet in the evening.  I suspect the alkaline phosphatase is coming either from bones or the biliary system. ------------------------------------- Assessment and Plan Assessment & Plan Carcinoma of right breast metastatic to lung Metastatic, recurrent breast carcinoma. High disease burden with widespread metastases and increased  tumor markers. Capecitabine  held due to elevated alkaline phosphatase. New hip lump likely sebaceous cyst, malignancy not excluded. - Held capecitabine  due to elevated alkaline phosphatase. - Reviewed pending alkaline phosphatase results; will call with decision regarding capecitabine  dosing, including possible dose reduction and restart. - Attempted to add CA 27-29 cancer marker test if not already drawn. - Advised monitoring of new hip lump for changes; will consider imaging or biopsy if changes occur. - Continued regular monitoring of laboratory studies, tumor markers, and imaging every few months. - Scheduled next oncology follow-up in one month. - Planned next imaging scan in a couple of months.  Malignant pleural effusion due to metastatic cancer Malignant pleural effusion secondary to metastatic breast cancer. Respiratory status unchanged. - Scheduled next pleural fluid drainage for January 19th. - Monitored for changes in respiratory status and symptoms of dyspnea.  Cancer-related pain Pain management coordinated with palliative care. - Coordinated with palliative care nurse for pain medication refills and ongoing management.  Anorexia and fatigue secondary to cancer and chemotherapy Ongoing fatigue and anorexia, related to advanced cancer and therapies. Variable activity tolerance and limited oral intake. -  Encouraged maintenance of activity as tolerated. - Discussed need for small, frequent meals due to limited appetite.      No orders of the defined types were placed in this encounter.  The patient has a good understanding of the overall plan. she agrees with it. she will call with any problems that may develop before the next visit here.  I personally spent a total of 30 minutes in the care of the patient today including preparing to see the patient, getting/reviewing separately obtained history, performing a medically appropriate exam/evaluation, counseling and educating, placing orders, referring and communicating with other health care professionals, documenting clinical information in the EHR, independently interpreting results, communicating results, and coordinating care.   Viinay K Chasiti Waddington, MD 08/09/2024    "

## 2024-08-11 ENCOUNTER — Other Ambulatory Visit: Payer: Self-pay

## 2024-08-16 ENCOUNTER — Ambulatory Visit (HOSPITAL_COMMUNITY)
Admission: RE | Admit: 2024-08-16 | Discharge: 2024-08-16 | Disposition: A | Discharge status: Home / Self Care | Source: Ambulatory Visit | Attending: Hematology and Oncology | Admitting: Hematology and Oncology

## 2024-08-16 ENCOUNTER — Ambulatory Visit (HOSPITAL_COMMUNITY)
Admission: RE | Admit: 2024-08-16 | Discharge: 2024-08-16 | Disposition: A | Source: Ambulatory Visit | Attending: Urology

## 2024-08-16 VITALS — BP 109/96

## 2024-08-16 DIAGNOSIS — Z853 Personal history of malignant neoplasm of breast: Secondary | ICD-10-CM | POA: Insufficient documentation

## 2024-08-16 DIAGNOSIS — C50911 Malignant neoplasm of unspecified site of right female breast: Secondary | ICD-10-CM

## 2024-08-16 DIAGNOSIS — J910 Malignant pleural effusion: Secondary | ICD-10-CM | POA: Diagnosis present

## 2024-08-16 DIAGNOSIS — J90 Pleural effusion, not elsewhere classified: Secondary | ICD-10-CM

## 2024-08-16 DIAGNOSIS — R0602 Shortness of breath: Secondary | ICD-10-CM

## 2024-08-16 MED ORDER — LIDOCAINE-EPINEPHRINE (PF) 2 %-1:200000 IJ SOLN
INTRAMUSCULAR | Status: AC
  Filled 2024-08-16: qty 20

## 2024-08-16 NOTE — Procedures (Signed)
 PROCEDURE SUMMARY:  Successful image-guided diagnostic right-sided thoracentesis. Yielded 0.550 liters of clear, amber colored pleural fluid. Patient tolerated procedure well. EBL: Zero No immediate complications.  Specimen was sent for labs. Post procedure CXR shows no pneumothorax.  Please see imaging section of Epic for full dictation.  Carlin LABOR Christyanna Mckeon PA-C 08/16/2024 2:29 PM

## 2024-08-17 LAB — LD, BODY FLUID (OTHER): LD, Body Fluid: 211 IU/L

## 2024-08-18 ENCOUNTER — Telehealth: Payer: Self-pay

## 2024-08-18 NOTE — Telephone Encounter (Signed)
 Pt called stating she has been unable to feel her legs and get out of her chair since last Wednesday. She states that she is able to go down steps but has trouble going up them and had to be picked up on Monday and put into her wheelchair when she came in for her thoracentesis. RN reported info to Maupin MD who advised that due to pt's spinal lesions it is likely spinal cord compression and he advised that she call 911 and be taken to the ED ASAP to rule out cord compression. Pt hesitant to go but RN emphasized importance of prompt treatment.

## 2024-08-19 ENCOUNTER — Telehealth: Payer: Self-pay

## 2024-08-19 ENCOUNTER — Other Ambulatory Visit (HOSPITAL_COMMUNITY): Payer: Self-pay

## 2024-08-19 LAB — CYTOLOGY - NON PAP

## 2024-08-19 NOTE — Telephone Encounter (Signed)
 Oral Oncology Patient Advocate Encounter  After completing a benefits investigation, prior authorization for Tramadol  is not required at this time patient does not currently have prescription drug coverage. Patient will ned to get a cash price from her pharmacy.  LVM for Patient    Charlott Hamilton,  CPhT-Adv  she/her/hers [facility]  [facility] Pharmacy Technician Patient Advocate Specialist III [facility] Phone: [phone]  Fax: [phone] [email or web address]

## 2024-08-19 NOTE — Telephone Encounter (Signed)
 Pt called oncology reporting that she was having trouble with her tramadol . This RN called pt and per pt tramadol  needs a PA, request sent to PA team for completion

## 2024-08-19 NOTE — Telephone Encounter (Signed)
 Pt called and LVM stating that [facility] needs clarification on recently ordered Tramadol  sent in by palliative NP.  This encounter routed to palliative NP and supporting nurse.

## 2024-08-20 LAB — BODY FLUID CULTURE W GRAM STAIN
Culture: NO GROWTH
Gram Stain: NONE SEEN

## 2024-08-23 ENCOUNTER — Other Ambulatory Visit: Payer: Self-pay

## 2024-08-23 NOTE — Progress Notes (Signed)
 Specialty Pharmacy Refill Coordination Note  Barbara Parks is a 74 y.o. female contacted today regarding refills of specialty medication(s) Capecitabine  (XELODA )   Patient requested Delivery   Delivery date: 08/27/24   Verified address: [address] [place], [place] [address]   Medication will be filled on: 08/26/24

## 2024-08-24 ENCOUNTER — Other Ambulatory Visit: Payer: Self-pay | Admitting: Nurse Practitioner

## 2024-08-24 DIAGNOSIS — G893 Neoplasm related pain (acute) (chronic): Secondary | ICD-10-CM

## 2024-08-24 DIAGNOSIS — Z515 Encounter for palliative care: Secondary | ICD-10-CM

## 2024-08-24 DIAGNOSIS — C7951 Secondary malignant neoplasm of bone: Secondary | ICD-10-CM

## 2024-08-24 DIAGNOSIS — C50911 Malignant neoplasm of unspecified site of right female breast: Secondary | ICD-10-CM

## 2024-08-24 MED ORDER — TRAMADOL HCL 50 MG PO TABS: 50.0000 mg | ORAL_TABLET | Freq: Four times a day (QID) | ORAL | 0 refills | Status: DC | PRN

## 2024-08-24 NOTE — Telephone Encounter (Signed)
 Oral Oncology Patient Advocate Encounter Followed up With [facility]  [place] to see if they filled the Tramadol   prescription for the patient, They had not but are getting it ready today for Cash Price $46.20 (which she has previously paid).  LVM for Patient, [facility] will also call the patient when the prescription is ready    Charlott Hamilton,  CPhT-Adv  she/her/hers [place]  [place] [facility] Pharmacy Technician Patient Advocate Specialist III [facility] Phone: [phone]  Fax: [phone] [email or web address]

## 2024-08-26 ENCOUNTER — Other Ambulatory Visit: Payer: Self-pay

## 2024-08-29 ENCOUNTER — Encounter (HOSPITAL_COMMUNITY): Payer: Self-pay

## 2024-08-29 ENCOUNTER — Emergency Department (HOSPITAL_COMMUNITY)

## 2024-08-29 ENCOUNTER — Observation Stay (HOSPITAL_COMMUNITY)
Admission: EM | Admit: 2024-08-29 | Discharge: 2024-09-03 | Disposition: A | Discharge status: Skilled Nursing Facility | Source: Home / Self Care | Attending: Emergency Medicine | Admitting: Emergency Medicine

## 2024-08-29 ENCOUNTER — Other Ambulatory Visit: Payer: Self-pay

## 2024-08-29 DIAGNOSIS — R748 Abnormal levels of other serum enzymes: Secondary | ICD-10-CM | POA: Insufficient documentation

## 2024-08-29 DIAGNOSIS — R110 Nausea: Secondary | ICD-10-CM

## 2024-08-29 DIAGNOSIS — R531 Weakness: Principal | ICD-10-CM

## 2024-08-29 DIAGNOSIS — D72829 Elevated white blood cell count, unspecified: Secondary | ICD-10-CM | POA: Insufficient documentation

## 2024-08-29 DIAGNOSIS — C50919 Malignant neoplasm of unspecified site of unspecified female breast: Secondary | ICD-10-CM | POA: Insufficient documentation

## 2024-08-29 DIAGNOSIS — Z170 Estrogen receptor positive status [ER+]: Secondary | ICD-10-CM

## 2024-08-29 DIAGNOSIS — R0902 Hypoxemia: Secondary | ICD-10-CM | POA: Insufficient documentation

## 2024-08-29 DIAGNOSIS — G893 Neoplasm related pain (acute) (chronic): Secondary | ICD-10-CM

## 2024-08-29 DIAGNOSIS — C50911 Malignant neoplasm of unspecified site of right female breast: Secondary | ICD-10-CM

## 2024-08-29 DIAGNOSIS — Z515 Encounter for palliative care: Secondary | ICD-10-CM

## 2024-08-29 DIAGNOSIS — I3131 Malignant pericardial effusion in diseases classified elsewhere: Secondary | ICD-10-CM

## 2024-08-29 DIAGNOSIS — C7951 Secondary malignant neoplasm of bone: Secondary | ICD-10-CM

## 2024-08-29 LAB — GLUCOSE, CAPILLARY
Glucose-Capillary: 98 mg/dL (ref 70–99)
Glucose-Capillary: 97 mg/dL (ref 70–99)

## 2024-08-29 LAB — CBC WITH DIFFERENTIAL/PLATELET
Abs Immature Granulocytes: 0.23 10*3/uL — ABNORMAL HIGH (ref 0.00–0.07)
Basophils Absolute: 0.2 10*3/uL — ABNORMAL HIGH (ref 0.0–0.1)
Basophils Relative: 1 %
Eosinophils Absolute: 0.6 10*3/uL — ABNORMAL HIGH (ref 0.0–0.5)
Eosinophils Relative: 3 %
HCT: 43.2 % (ref 36.0–46.0)
Hemoglobin: 13 g/dL (ref 12.0–15.0)
Immature Granulocytes: 1 %
Lymphocytes Relative: 3 %
Lymphs Abs: 0.5 10*3/uL — ABNORMAL LOW (ref 0.7–4.0)
MCH: 29.7 pg (ref 26.0–34.0)
MCHC: 30.1 g/dL (ref 30.0–36.0)
MCV: 98.6 fL (ref 80.0–100.0)
Monocytes Absolute: 1.8 10*3/uL — ABNORMAL HIGH (ref 0.1–1.0)
Monocytes Relative: 10 %
Neutro Abs: 14.3 10*3/uL — ABNORMAL HIGH (ref 1.7–7.7)
Neutrophils Relative %: 82 %
Platelets: 346 10*3/uL (ref 150–400)
RBC: 4.38 MIL/uL (ref 3.87–5.11)
RDW: 20.3 % — ABNORMAL HIGH (ref 11.5–15.5)
WBC: 17.6 10*3/uL — ABNORMAL HIGH (ref 4.0–10.5)
nRBC: 0 % (ref 0.0–0.2)

## 2024-08-29 LAB — COMPREHENSIVE METABOLIC PANEL WITH GFR
ALT: 18 U/L (ref 0–44)
AST: 81 U/L — ABNORMAL HIGH (ref 15–41)
Albumin: 3.1 g/dL — ABNORMAL LOW (ref 3.5–5.0)
Alkaline Phosphatase: 1446 U/L — ABNORMAL HIGH (ref 38–126)
Anion gap: 27 — ABNORMAL HIGH (ref 5–15)
BUN: 16 mg/dL (ref 8–23)
CO2: 14 mmol/L — ABNORMAL LOW (ref 22–32)
Calcium: 8.8 mg/dL — ABNORMAL LOW (ref 8.9–10.3)
Chloride: 96 mmol/L — ABNORMAL LOW (ref 98–111)
Creatinine, Ser: 0.76 mg/dL (ref 0.44–1.00)
GFR, Estimated: >60 mL/min
Glucose, Bld: 115 mg/dL — ABNORMAL HIGH (ref 70–99)
Potassium: 3.9 mmol/L (ref 3.5–5.1)
Sodium: 137 mmol/L (ref 135–145)
Total Bilirubin: 1.8 mg/dL — ABNORMAL HIGH (ref 0.0–1.2)
Total Protein: 6.5 g/dL (ref 6.5–8.1)

## 2024-08-29 MED ORDER — LOPERAMIDE HCL 2 MG PO CAPS: 2.0000 mg | ORAL_CAPSULE | Freq: Four times a day (QID) | ORAL | Status: DC | PRN

## 2024-08-29 MED ORDER — ADULT MULTIVITAMIN W/MINERALS CH
1.0000 | ORAL_TABLET | Freq: Every day | ORAL | Status: DC
  Administered 2024-08-29 – 2024-09-02 (×5): 1 via ORAL
  Filled 2024-08-29 (×6): qty 1

## 2024-08-29 MED ORDER — CARVEDILOL 3.125 MG PO TABS
3.1250 mg | ORAL_TABLET | Freq: Two times a day (BID) | ORAL | Status: DC
  Administered 2024-08-29: 3.125 mg via ORAL
  Filled 2024-08-29 (×2): qty 1

## 2024-08-29 MED ORDER — MORPHINE SULFATE (PF) 2 MG/ML IV SOLN
2.0000 mg | INTRAVENOUS | Status: DC | PRN
  Administered 2024-09-02: 2 mg via INTRAVENOUS
  Filled 2024-08-29: qty 1

## 2024-08-29 MED ORDER — IPRATROPIUM-ALBUTEROL 0.5-2.5 (3) MG/3ML IN SOLN: 3.0000 mL | RESPIRATORY_TRACT | Status: DC | PRN

## 2024-08-29 MED ORDER — ONDANSETRON HCL 4 MG PO TABS
4.0000 mg | ORAL_TABLET | Freq: Four times a day (QID) | ORAL | Status: DC | PRN
  Administered 2024-09-03: 4 mg via ORAL
  Filled 2024-08-29 (×2): qty 1

## 2024-08-29 MED ORDER — IOHEXOL 350 MG/ML SOLN
100.0000 mL | Freq: Once | INTRAVENOUS | Status: AC | PRN
  Administered 2024-08-29: 100 mL via INTRAVENOUS

## 2024-08-29 MED ORDER — LACTATED RINGERS IV BOLUS
500.0000 mL | Freq: Once | INTRAVENOUS | Status: AC
  Administered 2024-08-29: 500 mL via INTRAVENOUS

## 2024-08-29 MED ORDER — INSULIN ASPART 100 UNIT/ML IJ SOLN: 0.0000 [IU] | Freq: Three times a day (TID) | INTRAMUSCULAR | Status: DC

## 2024-08-29 MED ORDER — INSULIN ASPART 100 UNIT/ML IJ SOLN: 0.0000 [IU] | Freq: Every day | INTRAMUSCULAR | Status: DC

## 2024-08-29 MED ORDER — ENOXAPARIN SODIUM 40 MG/0.4ML IJ SOSY
40.0000 mg | PREFILLED_SYRINGE | INTRAMUSCULAR | Status: DC
  Administered 2024-08-29 – 2024-09-02 (×5): 40 mg via SUBCUTANEOUS
  Filled 2024-08-29 (×5): qty 0.4

## 2024-08-29 MED ORDER — TRAMADOL HCL 50 MG PO TABS
50.0000 mg | ORAL_TABLET | Freq: Every evening | ORAL | Status: DC | PRN
  Administered 2024-08-31 – 2024-09-02 (×3): 50 mg via ORAL
  Filled 2024-08-29 (×3): qty 1

## 2024-08-29 MED ORDER — LACTATED RINGERS IV BOLUS
1000.0000 mL | Freq: Once | INTRAVENOUS | Status: AC
  Administered 2024-08-29: 1000 mL via INTRAVENOUS

## 2024-08-29 MED ORDER — ALBUTEROL SULFATE (2.5 MG/3ML) 0.083% IN NEBU: 3.0000 mL | INHALATION_SOLUTION | Freq: Four times a day (QID) | RESPIRATORY_TRACT | Status: DC | PRN

## 2024-08-29 MED ORDER — ONDANSETRON HCL 4 MG/2ML IJ SOLN
4.0000 mg | Freq: Four times a day (QID) | INTRAMUSCULAR | Status: DC | PRN
  Filled 2024-08-29: qty 2

## 2024-08-29 MED ORDER — PROCHLORPERAZINE MALEATE 5 MG PO TABS: 10.0000 mg | ORAL_TABLET | Freq: Four times a day (QID) | ORAL | Status: DC | PRN

## 2024-08-29 NOTE — ED Notes (Signed)
 Limb alert applied to R arm.

## 2024-08-29 NOTE — ED Provider Notes (Signed)
 " [facility] EMERGENCY DEPARTMENT AT [facility] Provider Note   CSN: [id number] Arrival date & time: 08/29/24  1029     Patient presents with: Weakness   Barbara Parks is a 74 y.o. female.  History of metastatic breast cancer with liver involvement and bone metastasis.  Follows with palliative care for symptom management.  She also has history of malignant pleural effusion, last thoracentesis was 08/16/2024. Here today with increasing fatigue and generalized weakness.  She has noticed that over the past couple of weeks she has been having more trouble walking and needing assistance of a wheelchair at times to lean on, but over the past couple of days she is having extreme difficulty getting up and can only get out of her recliner independently which has an electronic assist.  She is on Xeloda  for chemotherapy and states she has been continuing to take this. She did note some blood in her urine over the past day or 2.  Denies dysuria or fever, denies abdominal pain aside of her baseline chronic cancer related pain.    Weakness      Prior to Admission medications  Medication Sig Start Date End Date Taking? Authorizing Provider  albuterol  (VENTOLIN  HFA) 108 (90 Base) MCG/ACT inhaler Inhale 2 puffs into the lungs every 6 (six) hours as needed for wheezing or shortness of breath. 06/22/24   Odean Potts, MD  capecitabine  (XELODA ) 500 MG tablet Take 2 tablets (1,000 mg total) by mouth 2 (two) times daily after a meal. 2 tabs in Am and 1 tab in PM. Take for 14 days and then off for 7 days 08/09/24   Odean Potts, MD  carvedilol  (COREG ) 3.125 MG tablet Take 1 tablet (3.125 mg total) by mouth 2 (two) times daily with a meal. 04/26/24   Odean Potts, MD  cetirizine (ZYRTEC) 10 MG tablet Take 10 mg by mouth daily.    [provider]  cholecalciferol (VITAMIN D3) 25 MCG (1000 UT) tablet Take 2 tablets (2,000 Units total) by mouth daily. 09/03/18   Magrinat, Sandria BROCKS, MD   cyclobenzaprine  (FLEXERIL ) 10 MG tablet Take 1 tablet (10 mg total) by mouth 3 (three) times daily as needed for muscle spasms. 04/01/24   Gudena, Vinay, MD  gemfibrozil  (LOPID ) 600 MG tablet Take 1 tablet (600 mg total) by mouth 2 (two) times daily before a meal. 06/08/24   Odean Potts, MD  lidocaine -prilocaine  (EMLA ) cream Apply 1 Application topically as needed. Apply 1 dose as needed for port a cath access 06/08/24   Odean Potts, MD  loperamide  (IMODIUM ) 2 MG capsule Take 2 mg by mouth 4 (four) times daily as needed for diarrhea or loose stools. Reported on 11/30/2015    [provider]  prochlorperazine  (COMPAZINE ) 10 MG tablet Take 1 tablet (10 mg total) by mouth every 6 (six) hours as needed for nausea or vomiting. 06/22/24   Pickenpack-Cousar, Athena N, NP  traMADol  (ULTRAM ) 50 MG tablet Take 1 tablet (50 mg total) by mouth every 6 (six) hours as needed. 08/24/24   Pickenpack-Cousar, Fannie SAILOR, NP    Allergies: Aspirin, Latex, and Nsaids    Review of Systems  Neurological:  Positive for weakness.    Updated Vital Signs BP 102/65   Pulse (!) 133   Temp 98.3 F (36.8 C) (Oral)   Resp 18   Ht 5' 4 (1.626 m)   Wt 61.2 kg   SpO2 92%   BMI 23.17 kg/m   Physical Exam  (all  labs ordered are listed, but only abnormal results are displayed) Labs Reviewed  COMPREHENSIVE METABOLIC PANEL WITH GFR  CBC WITH DIFFERENTIAL/PLATELET  URINALYSIS, ROUTINE W REFLEX MICROSCOPIC    EKG: None  Radiology: No results found.   Procedures   Medications Ordered in the ED - No data to display                                  Medical Decision Making This patient presents to the ED for concern of generalized weakness, this involves an extensive number of treatment options, and is a complaint that carries with it a high risk of complications and morbidity.  The differential diagnosis includes dehydration, electrolyte disturbance, UTI, sepsis, failure to thrive, PE, pneumonia,  other   Co morbidities that complicate the patient evaluation :   breast cancer with mets to the liver bone and lung   Additional history obtained:  Additional history obtained from EMR External records from outside source obtained and reviewed including prior notes, labs, imaging, recent oncology notes and thoracentesis note   Lab Tests:  I Ordered, and personally interpreted labs.  The pertinent results include: CBC with white blood cell count 17.6 is down from 18.22 weeks ago.  Hemoglobin is normal, CMP with CO2 14, anion gap 27 which is similar to previous, alk phos is elevated at 1466, bilirubin 1.8   Imaging Studies ordered:  I ordered imaging studies including CT PE study which shows no PE, worsening pulmonary metastases, CT abdomen pelvis progression of bilobar liver metastasis, increased soft tissue thickening about ileocecal valve measuring 3.3 x 3 cm with several small ileocolic lymph nodes most concerning for neoplasm I independently visualized and interpreted imaging within scope of identifying emergent findings  I agree with the radiologist interpretation   Cardiac Monitoring: / EKG:  The patient was maintained on a cardiac monitor.  I personally viewed and interpreted the cardiac monitored which showed an underlying rhythm of: Sinus tachycardia   Consultations Obtained:  I requested consultation with the colleges Dr. Davonna,  and discussed lab and imaging findings as well as pertinent plan - they recommend: CT evaluation for PE, CT abdomen pelvis due to elevated bilirubin, stop Xeloda , admit to hospitalist service   Problem List / ED Course / Critical interventions / Medication management  Generalized weakness-patient has metastatic cancer and has been having increased weakness for the past couple of weeks but since the past couple of days has not been able to get up or walk under her own power at all.  She denies increased shortness of breath from her baseline but  was hypoxic to 86 to 88% room air was put on 2 L of nasal cannula with improvement to 97%, her blood pressures were borderline low, though it appears they have been running low.  She was also tachycardic, she states her heart rate is usually elevated.  She was given IV fluids with little bit of improvement in heart rate.  Clinically she appears to be dehydrated.  She reports decreased p.o. intake due to low appetite.  Discussed with oncology as above.  Prior oncology notes show that she was post to hold the Xeloda , she states she held it for a few days and restarted, Dr. Kandala recommended stopping it again.  Discussed with hospitalist Dr. Darci for admission  I ordered medication including IV fluids  for dehydration  Reevaluation of the patient after these medicines showed that the  patient stayed the same I have reviewed the patients home medicines and have made adjustments as needed   Social Determinants of Health:  Lives alone but her sister is here from [place], [place]  staying with her     Amount and/or Complexity of Data Reviewed Labs: ordered. Radiology: ordered.  Risk Prescription drug management. Decision regarding hospitalization.        Final diagnoses:  None    ED Discharge Orders     None          Suellen Sherran LABOR, [place] 08/29/24 1758  "

## 2024-08-29 NOTE — ED Notes (Signed)
 2L O2 [place] administered.

## 2024-08-29 NOTE — ED Triage Notes (Signed)
 PT arrived from EMS because of weakness. Pt is a cancer pt and take chemo shots. Pt stated no pain but just generalized weakness. Pt walked with assistance to restrooom. Pt denies SHOB, Chest pain, N/V.

## 2024-08-29 NOTE — H&P (Signed)
 " History and Physical    Patient: Barbara Parks FMW:[id number] DOB: [birth year] DOA: 08/29/2024 DOS: the patient was seen and examined on 08/29/2024 PCP: Jeffrie Oneil BROCKS, MD  Patient coming from: Home  Chief Complaint:  Chief Complaint  Patient presents with   Weakness   HPI: Barbara Parks is a 74 y.o. female with medical history significant of metastatic breast cancer with liver involvement, status post chemotherapy right mastectomy, on Xeloda , hypertension, hyperlipidemia presented to the emergency department for evaluation of generalized weakness.  Patient has been feeling fatigue for almost a month but since last 2 weeks has noticed that she is unable to get out of bed and ambulate.  Patient did mention occasional shortness of breath with exertion.  Patient also has right upper quadrant abdominal discomfort since few months now.  She felt nauseous yesterday, no vomiting, normal appetite.  Denies any fever chills or rigors.  No cough.  Patient has been following oncologist Dr. Gudena, recently recently decreased Xeloda  dose due to elevated alkaline phosphatase levels.  In the emergency department, patient was noted to have tachycardia, low blood pressures, hypoxia requiring 2 L supplemental oxygen.  Laboratory findings showed alk phos of 1446 worsened from 902 on 08/09/2024, total bili 1.8, white count 17.6.  CT angio chest abdomen pelvis done showed no evidence of pulmonary embolism, moderate right pleural effusion, progression of innumerable pulmonary mets, bilobar liver mets, increased soft tissue thickening about ileocecal valve concerning for neoplasm.  ED provider discussed with oncologist, requested hospitalist admission for further management evaluation of generalized weakness, hypoxia.  Review of Systems: As mentioned in the history of present illness. All other systems reviewed and are negative. Past Medical History:  Diagnosis Date   Arthritis    left hip   Asthma    breast ca  2005   breast/chemo R mastectomy   Breast cancer (HCC)    Dermatitis    Diabetes mellitus without complication (HCC)    GERD (gastroesophageal reflux disease)    Hypercholesteremia    Metastasis to lung (HCC) dx'd 08/2011   Osteopenia due to cancer therapy 09/09/2013   Palpitations    Personal history of chemotherapy    Shortness of breath    Past Surgical History:  Procedure Laterality Date   BREAST SURGERY  2005   right   CATARACT EXTRACTION W/PHACO Left 01/18/2014   Procedure: CATARACT EXTRACTION PHACO AND INTRAOCULAR LENS PLACEMENT (IOC);  Surgeon: Oneil T. Roz, MD;  Location: [facility];  Service: Ophthalmology;  Laterality: Left;  CDE 15.79   CATARACT EXTRACTION W/PHACO Right 02/08/2014   Procedure: CATARACT EXTRACTION PHACO AND INTRAOCULAR LENS PLACEMENT (IOC);  Surgeon: Oneil T. Roz, MD;  Location: [facility];  Service: Ophthalmology;  Laterality: Right;  CDE 4.41   CHEST TUBE INSERTION  09/11/2011   Procedure: INSERTION PLEURAL DRAINAGE CATHETER;  Surgeon: Dorise MARLA Fellers, MD;  Location: [facility];  Service: Thoracic;  Laterality: Right;   IR IMAGING GUIDED PORT INSERTION  04/23/2022   MASTECTOMY Right    PERICARDIAL WINDOW  09/11/2011   Procedure: PERICARDIAL WINDOW;  Surgeon: Dorise MARLA Fellers, MD;  Location: [facility];  Service: Thoracic;  Laterality: N/A;   REMOVAL OF PLEURAL DRAINAGE CATHETER  12/19/2011   Procedure: REMOVAL OF PLEURAL DRAINAGE CATHETER;  Surgeon: Dorise MARLA Fellers, MD;  Location: [facility];  Service: Thoracic;  Laterality: Right;  TO BE DONE IN MINOR ROOM, SHORT STAY   SPINE SURGERY  1996   TOTAL HIP ARTHROPLASTY Left 06/07/2015   Procedure:  LEFT TOTAL HIP ARTHROPLASTY ANTERIOR APPROACH;  Surgeon: Dempsey Moan, MD;  Location: [facility];  Service: Orthopedics;  Laterality: Left;   VIDEO BRONCHOSCOPY  09/11/2011   Procedure: VIDEO BRONCHOSCOPY;  Surgeon: Dorise MARLA Fellers, MD;  Location: [facility];  Service: Thoracic;  Laterality: N/A;   Social History:  reports that she quit smoking about  16 years ago. Her smoking use included cigarettes. She started smoking about 47 years ago. She has a 45 pack-year smoking history. She has never used smokeless tobacco. She reports that she does not drink alcohol and does not use drugs.  Allergies[1]  Family History  Problem Relation Age of Onset   Cancer Mother        breast   Heart disease Father    Diabetes Other    Anesthesia problems Neg Hx     Prior to Admission medications  Medication Sig Start Date End Date Taking? Authorizing Provider  albuterol  (VENTOLIN  HFA) 108 (90 Base) MCG/ACT inhaler Inhale 2 puffs into the lungs every 6 (six) hours as needed for wheezing or shortness of breath. 06/22/24   Gudena, Vinay, MD  capecitabine  (XELODA ) 500 MG tablet Take 2 tablets (1,000 mg total) by mouth 2 (two) times daily after a meal. 2 tabs in Am and 1 tab in PM. Take for 14 days and then off for 7 days 08/09/24   Odean Potts, MD  carvedilol  (COREG ) 3.125 MG tablet Take 1 tablet (3.125 mg total) by mouth 2 (two) times daily with a meal. 04/26/24   Odean Potts, MD  cetirizine (ZYRTEC) 10 MG tablet Take 10 mg by mouth daily.    [provider]  cholecalciferol (VITAMIN D3) 25 MCG (1000 UT) tablet Take 2 tablets (2,000 Units total) by mouth daily. 09/03/18   Magrinat, Sandria BROCKS, MD  cyclobenzaprine  (FLEXERIL ) 10 MG tablet Take 1 tablet (10 mg total) by mouth 3 (three) times daily as needed for muscle spasms. 04/01/24   Gudena, Vinay, MD  gemfibrozil  (LOPID ) 600 MG tablet Take 1 tablet (600 mg total) by mouth 2 (two) times daily before a meal. 06/08/24   Odean Potts, MD  lidocaine -prilocaine  (EMLA ) cream Apply 1 Application topically as needed. Apply 1 dose as needed for port a cath access 06/08/24   Odean Potts, MD  loperamide  (IMODIUM ) 2 MG capsule Take 2 mg by mouth 4 (four) times daily as needed for diarrhea or loose stools. Reported on 11/30/2015    [provider]  prochlorperazine  (COMPAZINE ) 10 MG tablet Take 1 tablet (10  mg total) by mouth every 6 (six) hours as needed for nausea or vomiting. 06/22/24   Pickenpack-Cousar, Athena N, NP  traMADol  (ULTRAM ) 50 MG tablet Take 1 tablet (50 mg total) by mouth every 6 (six) hours as needed. 08/24/24   [place] Fannie SAILOR, NP    Physical Exam: Vitals:   08/29/24 1130 08/29/24 1145 08/29/24 1200 08/29/24 1215  BP: 101/62 91/62 96/62  98/60  Pulse: (!) 129 (!) 129 (!) 126 (!) 124  Resp: (!) 23 19 17 19   Temp:      TempSrc:      SpO2: 91% (!) 86% (!) 88% 92%  Weight:      Height:       General - Elderly ill Caucasian female, no apparent distress HEENT - PERRLA, EOMI, atraumatic head, non tender sinuses. Lung - Clear, basal rales, rhonchi, no wheezes. Heart - S1, S2 heard, tachycardia, no rubs, left> right pedal edema. Abdomen - Soft, non tender, RUQ tender, bowel sounds  good Neuro - Alert, awake and oriented x 3, non focal exam. Skin - Warm and dry.  Data Reviewed:     Latest Ref Rng & Units 08/29/2024   10:58 AM 08/09/2024   11:06 AM 08/03/2024   10:25 AM  CBC  WBC 4.0 - 10.5 K/uL 17.6  18.2  17.7   Hemoglobin 12.0 - 15.0 g/dL 86.9  87.0  86.6   Hematocrit 36.0 - 46.0 % 43.2  40.3  40.5   Platelets 150 - 400 K/uL 346  409  302       Latest Ref Rng & Units 08/29/2024   10:58 AM 08/09/2024   11:06 AM 08/03/2024   10:25 AM  CMP  Glucose 70 - 99 mg/dL 884  862  886   BUN 8 - 23 mg/dL 16  14  17    Creatinine 0.44 - 1.00 mg/dL 9.23  9.08  9.20   Sodium 135 - 145 mmol/L 137  133  134   Potassium 3.5 - 5.1 mmol/L 3.9  3.8  3.7   Chloride 98 - 111 mmol/L 96  97  99   CO2 22 - 32 mmol/L 14  15  17    Calcium  8.9 - 10.3 mg/dL 8.8  8.4  9.3   Total Protein 6.5 - 8.1 g/dL 6.5  6.9  7.3   Total Bilirubin 0.0 - 1.2 mg/dL 1.8  1.0  1.0   Alkaline Phos 38 - 126 U/L 1,446  902  871   AST 15 - 41 U/L 81  78  66   ALT 0 - 44 U/L 18  14  16     CT ABDOMEN PELVIS W CONTRAST Result Date: 08/29/2024 EXAM: CTA CHEST PE WITHOUT AND WITH CONTRAST CT ABDOMEN AND PELVIS  WITHOUT AND WITH CONTRAST 08/29/2024 12:47:08 PM TECHNIQUE: CTA of the chest was performed after the administration of 100 mL of iohexol  (OMNIPAQUE ) 350 MG/ML injection. Multiplanar reformatted images are provided for review. MIP images are provided for review. CT of the abdomen and pelvis was performed without and with the administration of intravenous contrast. Automated exposure control, iterative reconstruction, and/or weight based adjustment of the mA/kV was utilized to reduce the radiation dose to as low as reasonably achievable. COMPARISON: 06/30/2024 CLINICAL HISTORY: 74 year old female with history of stage 4 carcinoma of the right breast. Patient presents with weakness. Pulmonary embolism (PE) suspected, high probability. FINDINGS: CHEST: PULMONARY ARTERIES: Pulmonary arteries are adequately opacified for evaluation. No intraluminal filling defect to suggest pulmonary embolism. Main pulmonary artery is normal in caliber. MEDIASTINUM: Mild cardiac enlargement. No pericardial effusion. No enlarged mediastinal lymph nodes. The left hilar lymph node measures 1 cm and is unchanged from the previous exam. Right hilar lymph node is also unchanged measuring 1 cm. No enlarged axillary or supraclavicular lymph nodes. There is no acute abnormality of the thoracic aorta. LUNGS AND PLEURA: Innumerable lung nodules are scattered throughout the lungs compatible with metastatic disease. Increase in multiplicity and size from previous exam. Index left lower lobe lung nodule measures 0.7 cm (image 108/10), previously 0.3 cm. An index subpleural nodule over the left upper lobe measures 7 mm (image 71/10), previously 3 mm. An index nodule within the right upper lobe is new, measuring 3 mm (image 67/10). A right mid lung perihilar nodule measures 1.1 cm (image 86/10), previously 0.8 cm. Moderate right pleural effusion with posterior pleural thickening and chronic atelectasis involving the right lower lobe. No pneumothorax.  SOFT TISSUES AND BONES: Multifocal mixed lytic and sclerotic bone  lesions are again noted and appear similar to the previous exam. Lesions are noted within the thoracic spine, bilateral ribs, and bilateral scapulae. No acute soft tissue abnormality. ABDOMEN AND PELVIS: LIVER: Bilobar liver metastases are again noted; these are too numerous to count. Dominant lesion or confluence of lesions within segment 5 has significantly increased in the interval measuring 9.5 x 7.3 cm (image 37/2), previously 5.5 x 3.9 cm. The index lesion within the lateral segment of the left lobe of the liver measures 3.0 x 2.7 cm versus 2.5 x 2.5 cm previously. GALLBLADDER AND BILE DUCTS: Gallbladder is unremarkable. No biliary ductal dilatation. SPLEEN: The spleen is within normal limits in size and appearance. PANCREAS: Pancreas is normal in size and contour without focal lesion or ductal dilatation. ADRENAL GLANDS: Normal right adrenal gland. Unchanged left adrenal nodule measuring 1.5 cm, previously characterized as a benign adenoma (image 31/2). KIDNEYS, URETERS AND BLADDER: No stones in the kidneys or ureters. No hydronephrosis. No perinephric or periureteral stranding. Urinary bladder is unremarkable. GI AND BOWEL: The stomach is normal. The appendix is visualized and normal in caliber, without wall thickening, periappendiceal inflammation, or fluid. There is increased soft tissue thickening about the ileocecal valve measuring 3.3 x 3.0 cm (image 61/4). There is no bowel obstruction. No abnormal bowel wall thickening or distension. REPRODUCTIVE: Normal appearance of the uterus and adnexal structures. PERITONEUM AND RETROPERITONEUM: No ascites or free air. No peritoneal nodularity. LYMPH NODES: Several small ileocolic lymph nodes are noted within the right lower quadrant (image 58/4). These measure up to 5 mm in short axis. No retroperitoneal or pelvic lymph nodes. BONES AND SOFT TISSUES: Multifocal mixed lytic and sclerotic bone  metastases are again noted. Destructive lesion involving the posterior aspect of the L3 vertebral body is again noted with loss of the cortex along the posterior aspect of the vertebral body, similar to the previous exam. Left hip arthroplasty. Aortic atherosclerotic calcification. No focal soft tissue abnormality. IMPRESSION: 1. No evidence of pulmonary embolism. 2. Moderate right pleural effusion with posterior pleural thickening and chronic atelectasis involving the right lower lobe. Stable from previous exam. 3. Progression of innumerable pulmonary metastases. 4. Progression of bilobar liver metastases, with dominant lesion/confluence in segment 5 measuring 9.5 x 7.3 cm, previously 5.5 x 3.9 cm. 5. Increased soft tissue thickening about the ileocecal valve measuring 3.3 x 3.0 cm with several small ileocolic lymph nodes, most concerning for neoplasm (primary colonic malignancy versus metastatic disease), with infectious/inflammatory ileocecitis considered less likely; recommend colonoscopy/tissue sampling. Electronically signed by: Waddell Calk MD 08/29/2024 01:19 PM EST RP Workstation: [id number]   CT Angio Chest PE W and/or Wo Contrast Result Date: 08/29/2024 EXAM: CTA CHEST PE WITHOUT AND WITH CONTRAST CT ABDOMEN AND PELVIS WITHOUT AND WITH CONTRAST 08/29/2024 12:47:08 PM TECHNIQUE: CTA of the chest was performed after the administration of 100 mL of iohexol  (OMNIPAQUE ) 350 MG/ML injection. Multiplanar reformatted images are provided for review. MIP images are provided for review. CT of the abdomen and pelvis was performed without and with the administration of intravenous contrast. Automated exposure control, iterative reconstruction, and/or weight based adjustment of the mA/kV was utilized to reduce the radiation dose to as low as reasonably achievable. COMPARISON: 06/30/2024 CLINICAL HISTORY: 74 year old female with history of stage 4 carcinoma of the right breast. Patient presents with weakness.  Pulmonary embolism (PE) suspected, high probability. FINDINGS: CHEST: PULMONARY ARTERIES: Pulmonary arteries are adequately opacified for evaluation. No intraluminal filling defect to suggest pulmonary embolism. Main pulmonary artery is normal in  caliber. MEDIASTINUM: Mild cardiac enlargement. No pericardial effusion. No enlarged mediastinal lymph nodes. The left hilar lymph node measures 1 cm and is unchanged from the previous exam. Right hilar lymph node is also unchanged measuring 1 cm. No enlarged axillary or supraclavicular lymph nodes. There is no acute abnormality of the thoracic aorta. LUNGS AND PLEURA: Innumerable lung nodules are scattered throughout the lungs compatible with metastatic disease. Increase in multiplicity and size from previous exam. Index left lower lobe lung nodule measures 0.7 cm (image 108/10), previously 0.3 cm. An index subpleural nodule over the left upper lobe measures 7 mm (image 71/10), previously 3 mm. An index nodule within the right upper lobe is new, measuring 3 mm (image 67/10). A right mid lung perihilar nodule measures 1.1 cm (image 86/10), previously 0.8 cm. Moderate right pleural effusion with posterior pleural thickening and chronic atelectasis involving the right lower lobe. No pneumothorax. SOFT TISSUES AND BONES: Multifocal mixed lytic and sclerotic bone lesions are again noted and appear similar to the previous exam. Lesions are noted within the thoracic spine, bilateral ribs, and bilateral scapulae. No acute soft tissue abnormality. ABDOMEN AND PELVIS: LIVER: Bilobar liver metastases are again noted; these are too numerous to count. Dominant lesion or confluence of lesions within segment 5 has significantly increased in the interval measuring 9.5 x 7.3 cm (image 37/2), previously 5.5 x 3.9 cm. The index lesion within the lateral segment of the left lobe of the liver measures 3.0 x 2.7 cm versus 2.5 x 2.5 cm previously. GALLBLADDER AND BILE DUCTS: Gallbladder is  unremarkable. No biliary ductal dilatation. SPLEEN: The spleen is within normal limits in size and appearance. PANCREAS: Pancreas is normal in size and contour without focal lesion or ductal dilatation. ADRENAL GLANDS: Normal right adrenal gland. Unchanged left adrenal nodule measuring 1.5 cm, previously characterized as a benign adenoma (image 31/2). KIDNEYS, URETERS AND BLADDER: No stones in the kidneys or ureters. No hydronephrosis. No perinephric or periureteral stranding. Urinary bladder is unremarkable. GI AND BOWEL: The stomach is normal. The appendix is visualized and normal in caliber, without wall thickening, periappendiceal inflammation, or fluid. There is increased soft tissue thickening about the ileocecal valve measuring 3.3 x 3.0 cm (image 61/4). There is no bowel obstruction. No abnormal bowel wall thickening or distension. REPRODUCTIVE: Normal appearance of the uterus and adnexal structures. PERITONEUM AND RETROPERITONEUM: No ascites or free air. No peritoneal nodularity. LYMPH NODES: Several small ileocolic lymph nodes are noted within the right lower quadrant (image 58/4). These measure up to 5 mm in short axis. No retroperitoneal or pelvic lymph nodes. BONES AND SOFT TISSUES: Multifocal mixed lytic and sclerotic bone metastases are again noted. Destructive lesion involving the posterior aspect of the L3 vertebral body is again noted with loss of the cortex along the posterior aspect of the vertebral body, similar to the previous exam. Left hip arthroplasty. Aortic atherosclerotic calcification. No focal soft tissue abnormality. IMPRESSION: 1. No evidence of pulmonary embolism. 2. Moderate right pleural effusion with posterior pleural thickening and chronic atelectasis involving the right lower lobe. Stable from previous exam. 3. Progression of innumerable pulmonary metastases. 4. Progression of bilobar liver metastases, with dominant lesion/confluence in segment 5 measuring 9.5 x 7.3 cm,  previously 5.5 x 3.9 cm. 5. Increased soft tissue thickening about the ileocecal valve measuring 3.3 x 3.0 cm with several small ileocolic lymph nodes, most concerning for neoplasm (primary colonic malignancy versus metastatic disease), with infectious/inflammatory ileocecitis considered less likely; recommend colonoscopy/tissue sampling. Electronically signed by: Waddell  Stroud MD 08/29/2024 01:19 PM EST RP Workstation: [id number]   DG Chest Portable 1 View Result Date: 08/29/2024 EXAM: 1 VIEW(S) XRAY OF THE CHEST 08/29/2024 11:18:00 AM COMPARISON: CXR dated 08/16/2024 and CT Chest dated 06/29/2024. CLINICAL HISTORY: Fatigue. FINDINGS: LINES, TUBES AND DEVICES: Right IJ Port-A-Cath is stable with its tip just distal to the superior cavoatrial junction. LUNGS AND PLEURA: Small right pleural effusion. Right basilar opacities. Stable innumerable nodular-like densities throughout both lungs. No pneumothorax. HEART AND MEDIASTINUM: No acute abnormality of the cardiac and mediastinal silhouettes. BONES AND SOFT TISSUES: No acute osseous abnormality. IMPRESSION: 1. Stable innumerable nodular densities throughout both lungs. 2. Small right pleural effusion and right basilar opacities. Electronically signed by: Morgane Naveau MD 08/29/2024 11:25 AM EST RP Workstation: [id number]   Assessment and Plan: Kimbery Harwood is a 74 y.o. female with medical history significant of metastatic breast cancer with liver involvement, status post chemotherapy right mastectomy, on Xeloda , hypertension, hyperlipidemia presented to the emergency department for evaluation of generalized weakness, fatigue, shortness of breath, nausea.  CTA chest abdomen pelvis revealed no pulm embolism, multiple pulmonary mets, bilobar liver mets.  Patient is requiring 2 L supplemental oxygen and will need further management and oncology evaluation.  Plan: Generalized weakness Debility: Admit the patient to medicine, telemetry service. Continue to  monitor vitals closely, supplemental oxygen to maintain saturation greater than 92%. PT OT evaluation. Encourage oral diet, out of bed to chair.  Hypoxia: In the setting of innumerable pulmonary mets, moderate right pleural effusion, atelectasis. Will give duonebs q6h PRN.  Encourage incentive spirometry. If hypoxia persists, she will need thoracentesis. Continue supplemental oxygen to maintain saturation greater than 92%.  Elevated alk phos- In the setting of bilobar liver mets, chemotherapy. Continue to trend LFTs. Oncology evaluation for further management.  Leukocytosis- Seems chronic with neutrophilia. Trend CBC.  Metastatic breast cancer- Mets to lungs, liver, bone, possible colon. Patient is currently on Xeloda  therapy which is held. Oncology evaluation will be called. May benefit from palliative care consult.  Hypertension: Sinus tachycardia- Blood pressure lower side.  Will hold off on antihypertensive regimen.  Left greater than right pedal edema: Check DVT study. Patient has prior history of clot with catheter, not currently on Xarelto . Continue Lovenox  for DVT prophylaxis.    Advance Care Planning:   Code Status: Full Code discussed with patient at bedside  Consults: Oncology  Family Communication: No family at bedside  Severity of Illness: The appropriate patient status for this patient is OBSERVATION. Observation status is judged to be reasonable and necessary in order to provide the required intensity of service to ensure the patient's safety. The patient's presenting symptoms, physical exam findings, and initial radiographic and laboratory data in the context of their medical condition is felt to place them at decreased risk for further clinical deterioration. Furthermore, it is anticipated that the patient will be medically stable for discharge from the hospital within 2 midnights of admission.   Author: Concepcion Riser, MD 08/29/2024 1:43 PM  For on  call review [email or web address].      [1]  Allergies Allergen Reactions   Aspirin     Upset stomach   Latex Other (See Comments)    Blistering and skin peels off   Nsaids Nausea And Vomiting    Extreme nausea and vomiting   "

## 2024-08-30 ENCOUNTER — Observation Stay (HOSPITAL_COMMUNITY)

## 2024-08-30 ENCOUNTER — Telehealth: Payer: Self-pay | Admitting: Hematology and Oncology

## 2024-08-30 LAB — COMPREHENSIVE METABOLIC PANEL WITH GFR
ALT: 15 U/L (ref 0–44)
AST: 65 U/L — ABNORMAL HIGH (ref 15–41)
Albumin: 2.5 g/dL — ABNORMAL LOW (ref 3.5–5.0)
Alkaline Phosphatase: 1017 U/L — ABNORMAL HIGH (ref 38–126)
Anion gap: 20 — ABNORMAL HIGH (ref 5–15)
BUN: 16 mg/dL (ref 8–23)
CO2: 20 mmol/L — ABNORMAL LOW (ref 22–32)
Calcium: 8.3 mg/dL — ABNORMAL LOW (ref 8.9–10.3)
Chloride: 99 mmol/L (ref 98–111)
Creatinine, Ser: 0.56 mg/dL (ref 0.44–1.00)
GFR, Estimated: >60 mL/min
Glucose, Bld: 71 mg/dL (ref 70–99)
Potassium: 3.8 mmol/L (ref 3.5–5.1)
Sodium: 139 mmol/L (ref 135–145)
Total Bilirubin: 1.7 mg/dL — ABNORMAL HIGH (ref 0.0–1.2)
Total Protein: 5.2 g/dL — ABNORMAL LOW (ref 6.5–8.1)

## 2024-08-30 LAB — CBC WITH DIFFERENTIAL/PLATELET
Abs Immature Granulocytes: 0.14 10*3/uL — ABNORMAL HIGH (ref 0.00–0.07)
Basophils Absolute: 0.1 10*3/uL (ref 0.0–0.1)
Basophils Relative: 1 %
Eosinophils Absolute: 0.9 10*3/uL — ABNORMAL HIGH (ref 0.0–0.5)
Eosinophils Relative: 6 %
HCT: 36 % (ref 36.0–46.0)
Hemoglobin: 11.2 g/dL — ABNORMAL LOW (ref 12.0–15.0)
Immature Granulocytes: 1 %
Lymphocytes Relative: 5 %
Lymphs Abs: 0.7 10*3/uL (ref 0.7–4.0)
MCH: 29.9 pg (ref 26.0–34.0)
MCHC: 31.1 g/dL (ref 30.0–36.0)
MCV: 96.3 fL (ref 80.0–100.0)
Monocytes Absolute: 1.9 10*3/uL — ABNORMAL HIGH (ref 0.1–1.0)
Monocytes Relative: 13 %
Neutro Abs: 10.9 10*3/uL — ABNORMAL HIGH (ref 1.7–7.7)
Neutrophils Relative %: 74 %
Platelets: 280 10*3/uL (ref 150–400)
RBC: 3.74 MIL/uL — ABNORMAL LOW (ref 3.87–5.11)
RDW: 20.4 % — ABNORMAL HIGH (ref 11.5–15.5)
WBC: 14.7 10*3/uL — ABNORMAL HIGH (ref 4.0–10.5)
nRBC: 0 % (ref 0.0–0.2)

## 2024-08-30 LAB — GLUCOSE, CAPILLARY
Glucose-Capillary: 96 mg/dL (ref 70–99)
Glucose-Capillary: 93 mg/dL (ref 70–99)
Glucose-Capillary: 103 mg/dL — ABNORMAL HIGH (ref 70–99)
Glucose-Capillary: 121 mg/dL — ABNORMAL HIGH (ref 70–99)

## 2024-08-30 NOTE — Plan of Care (Signed)
   Problem: Education: Goal: Knowledge of General Education information will improve Description Including pain rating scale, medication(s)/side effects and non-pharmacologic comfort measures Outcome: Progressing   Problem: Health Behavior/Discharge Planning: Goal: Ability to manage health-related needs will improve Outcome: Progressing

## 2024-08-30 NOTE — Therapy (Signed)
 Arrived for PT eval and spoke with patient.  She wishes to proceed with Hospice Care at this time after speaking to her oncologist at lunchtime today and will be stopping any treatment.  Signing off PT order to Hospice.  2:08 PM, 08/30/24 Samaria Anes Small Viaan Knippenberg MPT [place] [facility] [place] [phone]

## 2024-08-30 NOTE — Plan of Care (Signed)

## 2024-08-30 NOTE — Plan of Care (Signed)
" °  Problem: Acute Rehab PT Goals(only PT should resolve) Goal: Pt Will Go Supine/Side To Sit Outcome: Progressing Flowsheets (Taken 08/30/2024 1630) Pt will go Supine/Side to Sit: with moderate assist Goal: Patient Will Transfer Sit To/From Stand Outcome: Progressing Flowsheets (Taken 08/30/2024 1630) Patient will transfer sit to/from stand: with moderate assist Goal: Pt Will Transfer Bed To Chair/Chair To Bed Outcome: Progressing Flowsheets (Taken 08/30/2024 1630) Pt will Transfer Bed to Chair/Chair to Bed: with max assist Goal: Pt Will Ambulate Outcome: Progressing Flowsheets (Taken 08/30/2024 1630) Pt will Ambulate:  10 feet  with maximum assist  with rolling walker   "

## 2024-08-31 LAB — GLUCOSE, CAPILLARY
Glucose-Capillary: 87 mg/dL (ref 70–99)
Glucose-Capillary: 116 mg/dL — ABNORMAL HIGH (ref 70–99)
Glucose-Capillary: 102 mg/dL — ABNORMAL HIGH (ref 70–99)
Glucose-Capillary: 139 mg/dL — ABNORMAL HIGH (ref 70–99)

## 2024-08-31 MED ORDER — ALUM & MAG HYDROXIDE-SIMETH 200-200-20 MG/5ML PO SUSP
30.0000 mL | ORAL | Status: DC | PRN
  Administered 2024-08-31 – 2024-09-01 (×2): 30 mL via ORAL
  Filled 2024-08-31 (×2): qty 30

## 2024-08-31 NOTE — Plan of Care (Signed)
   Problem: Education: Goal: Knowledge of General Education information will improve Description Including pain rating scale, medication(s)/side effects and non-pharmacologic comfort measures Outcome: Progressing

## 2024-08-31 NOTE — Progress Notes (Signed)
" °   08/31/24 1921  Assess: MEWS Score  Temp 97.9 F (36.6 C)  BP 100/63  MAP (mmHg) 74  Pulse Rate (!) 131  Resp 20  SpO2 98 %  O2 Device Nasal Cannula  O2 Flow Rate (L/min) 2 L/min  Assess: MEWS Score  MEWS Temp 0  MEWS Systolic 1  MEWS Pulse 3  MEWS RR 0  MEWS LOC 0  MEWS Score 4  MEWS Score Color Red  Assess: if the MEWS score is Yellow or Red  Were vital signs accurate and taken at a resting state? Yes  Does the patient meet 2 or more of the SIRS criteria? No  MEWS guidelines implemented  Yes, red  Treat  MEWS Interventions Considered administering scheduled or prn medications/treatments as ordered  Take Vital Signs  Increase Vital Sign Frequency  Red: Q1hr x2, continue Q4hrs until patient remains green for 12hrs  Escalate  MEWS: Escalate Red: Discuss with charge nurse and notify provider. Consider notifying RRT. If remains red for 2 hours consider need for higher level of care  Notify: Charge Nurse/RN  Name of Charge Nurse/RN Notified Lisa,RN  Provider Notification  Provider Name/Title Lynwood Glisson  Date Provider Notified 08/31/24  Time Provider Notified [phone]  Notification Reason Other (Comment) (RED MEWS-HR130s)  Provider response No new orders  Date of Provider Response 08/31/24  Time of Provider Response 1940  Assess: SIRS CRITERIA  SIRS Temperature  0  SIRS Respirations  0  SIRS Pulse 1  SIRS WBC 0  SIRS Score Sum  1   Pt with RED MEWS-HR-132, admitted with generalized weakness, pt has metastatic cancer. Looks like her HR has been running in 120s-130s since she been here, with Bps low in 80s-90s so coreg  d/c'ed. BP improved at 100/63 at this time. No complaints of pain. Other vitals unremarkable.  "

## 2024-08-31 NOTE — Plan of Care (Signed)

## 2024-09-01 DIAGNOSIS — R531 Weakness: Secondary | ICD-10-CM | POA: Diagnosis not present

## 2024-09-01 LAB — URINALYSIS, ROUTINE W REFLEX MICROSCOPIC
Bilirubin Urine: NEGATIVE
Glucose, UA: NEGATIVE mg/dL
Hgb urine dipstick: NEGATIVE
Ketones, ur: 5 mg/dL — AB
Leukocytes,Ua: NEGATIVE
Nitrite: NEGATIVE
Protein, ur: NEGATIVE mg/dL
Specific Gravity, Urine: 1.031 — ABNORMAL HIGH (ref 1.005–1.030)
WBC, UA: >50 WBC/hpf (ref 0–5)
pH: 5 (ref 5.0–8.0)

## 2024-09-01 LAB — GLUCOSE, CAPILLARY
Glucose-Capillary: 93 mg/dL (ref 70–99)
Glucose-Capillary: 105 mg/dL — ABNORMAL HIGH (ref 70–99)
Glucose-Capillary: 117 mg/dL — ABNORMAL HIGH (ref 70–99)
Glucose-Capillary: 122 mg/dL — ABNORMAL HIGH (ref 70–99)
Glucose-Capillary: 103 mg/dL — ABNORMAL HIGH (ref 70–99)

## 2024-09-01 MED ORDER — MIDODRINE HCL 5 MG PO TABS
2.5000 mg | ORAL_TABLET | Freq: Three times a day (TID) | ORAL | Status: DC
  Administered 2024-09-01 – 2024-09-02 (×4): 2.5 mg via ORAL
  Filled 2024-09-01 (×4): qty 1

## 2024-09-01 MED ORDER — SODIUM CHLORIDE 0.9 % IV BOLUS
500.0000 mL | Freq: Once | INTRAVENOUS | Status: AC
  Administered 2024-09-01: 500 mL via INTRAVENOUS

## 2024-09-01 MED ORDER — ACETAMINOPHEN 325 MG PO TABS: 650.0000 mg | ORAL_TABLET | Freq: Four times a day (QID) | ORAL | Status: DC | PRN

## 2024-09-01 MED ORDER — POLYETHYLENE GLYCOL 3350 17 G PO PACK: 17.0000 g | PACK | Freq: Every day | ORAL | Status: DC | PRN

## 2024-09-01 MED ORDER — ALBUMIN HUMAN 5 % IV SOLN
12.5000 g | Freq: Once | INTRAVENOUS | Status: AC
  Administered 2024-09-01: 12.5 g via INTRAVENOUS
  Filled 2024-09-01: qty 250

## 2024-09-01 NOTE — Plan of Care (Signed)
   Problem: Education: Goal: Knowledge of General Education information will improve Description Including pain rating scale, medication(s)/side effects and non-pharmacologic comfort measures Outcome: Progressing   Problem: Health Behavior/Discharge Planning: Goal: Ability to manage health-related needs will improve Outcome: Progressing

## 2024-09-01 NOTE — Progress Notes (Signed)
" °                                                                                                     °                                                   °  Daily Progress Note   Patient Name: Barbara Parks       Date: 09/01/2024 DOB: 11/19/1950  Age: 74 y.o. MRN#: [id number] Attending Physician: Juvenal Harlene PENNER, DO Primary Care Physician: Jeffrie Oneil BROCKS, MD Admit Date: 08/29/2024  Reason for Initial Consultation/Follow-up: {Reason for Consult:23484}  HPI:  Discussed the use of AI scribe software for clinical note transcription with the patient, who gave verbal consent to proceed.  History of Present Illness      Today, ***  Chart review/care coordination:  Completed extensive chart review including EPIC notes, ***. Coordinated care with ****.    Length of Stay: 2   Physical Exam          Vital Signs: BP (!) 83/64 (BP Location: Left Arm)   Pulse (!) 113   Temp 98.1 F (36.7 C) (Axillary)   Resp 19   Ht 5' 4 (1.626 m)   Wt 68.6 kg   SpO2 93%   BMI 25.96 kg/m  SpO2: SpO2: 93 % O2 Device: O2 Device: Nasal Cannula O2 Flow Rate: O2 Flow Rate (L/min): 2 L/min      Palliative Assessment/Data:   Palliative Care Assessment & Plan   Patient Profile/Assessment:  ***   Recommendations/Plan:  Assessment and Plan Assessment & Plan        Symptom management:  ***  Prognosis:  {Palliative Care Prognosis:23504}    Discharge Planning: {Palliative dispostion:23505}    Detailed review of medical records (labs, imaging, vital signs), medically appropriate exam, discussed with treatment team, counseling and education to patient, family, & staff, documenting clinical information, medication management, coordination of care   Total time:  I personally spent a total of *** minutes in the care of the patient today including {Time Based Coding:210964241}.    Billing based on MDM: ***  {Problems Addressed:304933}  {Amount and/or Complexity of  Ijuj:695065}  {Risks:304936}         Laymon CHRISTELLA Pinal, NP  Palliative Medicine Team Team phone # [phone]  Thank you for allowing the Palliative Medicine Team to assist in the care of this patient. Please utilize secure chat with additional questions, if there is no response within 30 minutes please call the above phone number.  Palliative Medicine Team providers are available by phone from 7am to 7pm daily and can be reached through the team cell phone.  Should this patient require assistance outside of these hours, please call the patient's attending physician.   "

## 2024-09-01 NOTE — Care Management Important Message (Signed)
 Important Message  Patient Details  Name: Barbara Parks MRN: [id number] Date of Birth: 27-May-1951   Important Message Given:  N/A - LOS <3 / Initial given by admissions     Duwaine LITTIE Ada 09/01/2024, 11:02 AM

## 2024-09-01 NOTE — TOC Progression Note (Signed)
 Transition of Care [facility] - Progression Note    Patient Details  Name: Barbara Parks MRN: [id number] Date of Birth: 03-Jan-1951  Transition of Care [facility]) CM/SW Contact  Hoy DELENA Bigness, LCSW Phone Number: 09/01/2024, 3:23 PM  Clinical Narrative:    [facility] RN evaluated pt for residential hospice however, due to pt still eating they do not feel that pt is appropriate for residential hospice at this time.  Pt agreeable to referral to [facility] to evaluate for residential hospice at [facility]. Referral sent to [facility] to have pt evaluated. ICM will continue to follow.   Expected Discharge Plan: Hospice Medical Facility Barriers to Discharge: Continued Medical Work up, Hospice Bed not available, SNF Pending bed offer               Expected Discharge Plan and Services In-house Referral: Hospice / Palliative Care   Post Acute Care Choice: Skilled Nursing Facility, Hospice Living arrangements for the past 2 months: Single Family Home                 DME Arranged: N/A                     Social Drivers of Health (SDOH) Interventions SDOH Screenings   Food Insecurity: No Food Insecurity (08/29/2024)  Housing: Low Risk (08/29/2024)  Transportation Needs: No Transportation Needs (08/29/2024)  Utilities: Not At Risk (08/29/2024)  Depression (PHQ2-9): Low Risk (05/10/2024)  Social Connections: Socially Isolated (08/29/2024)  Tobacco Use: Medium Risk (08/29/2024)    Readmission Risk Interventions     No data to display

## 2024-09-01 NOTE — Progress Notes (Signed)
 BP78/56, recheck manual 84/58, HR-116, Lynwood Kipper, NP notified. New orders noted for albumin  and NS bolus.

## 2024-09-01 NOTE — Plan of Care (Signed)

## 2024-09-01 NOTE — Progress Notes (Addendum)
 " Progress Note   Patient: Barbara Parks Charon FMW:[id number] DOB: March 07, 1951 DOA: 08/29/2024     2 DOS: the patient was seen and examined on 09/01/2024   Brief hospital course: Avenell Sellers is a 74 y.o. female with medical history significant of metastatic breast cancer with liver involvement, status post chemotherapy right mastectomy, on Xeloda , hypertension, hyperlipidemia presented to the emergency department for evaluation of generalized weakness, fatigue, shortness of breath, nausea.  CTA chest abdomen pelvis revealed no pulm embolism, multiple pulmonary mets, bilobar liver mets.  Patient is requiring 2 L supplemental oxygen and will need further management and oncology evaluation.   Assessment and Plan:   Metastatic breast cancer-with dramatic progression of disease in the liver and likely T3 fracture with spinal cord compression.  Mets to lungs, liver, bone, possible colon. Continue to hold Xeloda .  Oncologist Dr. Odean spoke to her 08/30/24- due to rapidly progressing cancer advised hospice care, palliative team consulted-- plan to transition to DNR/hospice-- ? Residential hospice   Hypotension - Required  albumin  overnight blood pressures remain low - Will do a trial of midodrine   Hypoxia: In the setting of innumerable pulmonary mets, moderate right pleural effusion, atelectasis. Continue duonebs q6h PRN.  Encourage incentive spirometry. She got thoracentesis 2 weeks ago.  Continue supplemental oxygen to maintain saturation greater than 92%.   Elevated alk phos- In the setting of bilobar liver mets, chemotherapy. Alk phos today improved to 1017 from 1446. stop Xeloda . Oncology team advised hospice care.   Leukocytosis- Seems chronic with neutrophilia.    Hypertension: Blood pressure lower side.  Hold antihypertensives.   Left greater than right pedal edema: DVT study unremarkable. Patient has prior history of clot with catheter, not currently on Xarelto . Continue  Lovenox  for DVT prophylaxis.   Suspect will decompensate quickly and may require inpatient hospice  DVT prophylaxis: enoxaparin  (LOVENOX ) injection 40 mg Start: 08/29/24 1800 SCDs Start: 08/29/24 1342  Level of care: Telemetry   Code Status: Full Code  Subjective: Says she has spoke with palliative care and plan is for hospice C/o pain in left arm  Physical Exam: Vitals:   09/01/24 0414 09/01/24 0416 09/01/24 0530 09/01/24 0922  BP: (!) 78/56 (!) 84/58 (!) 93/56 (!) 86/52  Pulse: (!) 110 (!) 116 (!) 114 (!) 116  Resp: 20 19 18 20   Temp: (!) 97.2 F (36.2 C) 97.8 F (36.6 C) 97.8 F (36.6 C) 97.8 F (36.6 C)  TempSrc: Oral Oral Oral Oral  SpO2: 93% 93% 93% 92%  Weight:      Height:        General: Appearance:     Overweight female in no acute distress     Lungs:     respirations unlabored  Heart:    Tachycardic.  MS:   All extremities are intact.   Neurologic:   Awake, alert, oriented x 3. No apparent focal neurological           defect.      Data Reviewed:      Latest Ref Rng & Units 08/30/2024    4:00 AM 08/29/2024   10:58 AM 08/09/2024   11:06 AM  CBC  WBC 4.0 - 10.5 K/uL 14.7  17.6  18.2   Hemoglobin 12.0 - 15.0 g/dL 88.7  86.9  87.0   Hematocrit 36.0 - 46.0 % 36.0  43.2  40.3   Platelets 150 - 400 K/uL 280  346  409       Latest Ref Rng & Units  08/30/2024    4:00 AM 08/29/2024   10:58 AM 08/09/2024   11:06 AM  BMP  Glucose 70 - 99 mg/dL 71  884  862   BUN 8 - 23 mg/dL 16  16  14    Creatinine 0.44 - 1.00 mg/dL 9.43  9.23  9.08   Sodium 135 - 145 mmol/L 139  137  133   Potassium 3.5 - 5.1 mmol/L 3.8  3.9  3.8   Chloride 98 - 111 mmol/L 99  96  97   CO2 22 - 32 mmol/L 20  14  15    Calcium  8.9 - 10.3 mg/dL 8.3  8.8  8.4    US  Venous Img Lower Bilateral (DVT) Result Date: 08/30/2024 CLINICAL DATA:  Bilateral lower extremity edema EXAM: BILATERAL LOWER EXTREMITY VENOUS DOPPLER ULTRASOUND TECHNIQUE: Gray-scale sonography with graded compression, as well as  color Doppler and duplex ultrasound were performed to evaluate the lower extremity deep venous systems from the level of the common femoral vein and including the common femoral, femoral, profunda femoral, popliteal and calf veins including the posterior tibial, peroneal and gastrocnemius veins when visible. Spectral Doppler was utilized to evaluate flow at rest and with distal augmentation maneuvers in the common femoral, femoral and popliteal veins. COMPARISON:  None Available. FINDINGS: RIGHT LOWER EXTREMITY Common Femoral Vein: No evidence of thrombus. Normal compressibility, respiratory phasicity and response to augmentation. Saphenofemoral Junction: No evidence of thrombus. Normal compressibility and flow on color Doppler imaging. Profunda Femoral Vein: No evidence of thrombus. Normal compressibility and flow on color Doppler imaging. Femoral Vein: No evidence of thrombus. Normal compressibility, respiratory phasicity and response to augmentation. Popliteal Vein: No evidence of thrombus. Normal compressibility, respiratory phasicity and response to augmentation. Calf Veins: No evidence of thrombus. Normal compressibility and flow on color Doppler imaging. LEFT LOWER EXTREMITY Common Femoral Vein: No evidence of thrombus. Normal compressibility, respiratory phasicity and response to augmentation. Saphenofemoral Junction: No evidence of thrombus. Normal compressibility and flow on color Doppler imaging. Profunda Femoral Vein: No evidence of thrombus. Normal compressibility and flow on color Doppler imaging. Femoral Vein: No evidence of thrombus. Normal compressibility, respiratory phasicity and response to augmentation. Popliteal Vein: No evidence of thrombus. Normal compressibility, respiratory phasicity and response to augmentation. Calf Veins: No evidence of thrombus. Normal compressibility and flow on color Doppler imaging. IMPRESSION: No evidence of deep venous thrombosis in either lower extremity.  Electronically Signed   By: CHRISTELLA.  Shick M.D.   On: 08/30/2024 10:36    Family Communication: Discussed with patient, understand and agree. All questions answered.  Disposition: Status is: Inpatient  D/C with hospice-- residential vs SNF  Time spent: 44 minutes  Author: Ariyah Sedlack U Aarav Burgett, DO 09/01/2024 10:22 AM Secure chat 7am to 7pm For on call review [email or web address].    "

## 2024-09-02 DIAGNOSIS — R531 Weakness: Secondary | ICD-10-CM | POA: Diagnosis not present

## 2024-09-02 LAB — GLUCOSE, CAPILLARY
Glucose-Capillary: 87 mg/dL (ref 70–99)
Glucose-Capillary: 103 mg/dL — ABNORMAL HIGH (ref 70–99)
Glucose-Capillary: 99 mg/dL (ref 70–99)
Glucose-Capillary: 117 mg/dL — ABNORMAL HIGH (ref 70–99)

## 2024-09-02 MED ORDER — GEMFIBROZIL 600 MG PO TABS
600.0000 mg | ORAL_TABLET | Freq: Two times a day (BID) | ORAL | Status: DC
  Administered 2024-09-03: 600 mg via ORAL
  Filled 2024-09-02: qty 1

## 2024-09-02 NOTE — Plan of Care (Signed)
" °  Problem: Clinical Measurements: Goal: Ability to maintain clinical measurements within normal limits will improve Outcome: Progressing Goal: Will remain free from infection Outcome: Progressing   Problem: Activity: Goal: Risk for activity intolerance will decrease Outcome: Progressing   Problem: Coping: Goal: Level of anxiety will decrease Outcome: Progressing   Problem: Pain Managment: Goal: General experience of comfort will improve and/or be controlled Outcome: Progressing   Problem: Safety: Goal: Ability to remain free from injury will improve Outcome: Progressing   Problem: Skin Integrity: Goal: Risk for impaired skin integrity will decrease Outcome: Progressing   Problem: Skin Integrity: Goal: Risk for impaired skin integrity will decrease Outcome: Progressing   Problem: Tissue Perfusion: Goal: Adequacy of tissue perfusion will improve Outcome: Progressing   "

## 2024-09-02 NOTE — Progress Notes (Signed)
 Physical Therapy Treatment Patient Details Name: Barbara Parks MRN: [id number] DOB: [birth year] Today's Date: 09/02/2024   History of Present Illness Barbara Parks is a 74 y.o. female with medical history significant of metastatic breast cancer with liver involvement, status post chemotherapy right mastectomy, on Xeloda , hypertension, hyperlipidemia presented to the emergency department for evaluation of generalized weakness.  Patient has been feeling fatigue for almost a month but since last 2 weeks has noticed that she is unable to get out of bed and ambulate.  Patient did mention occasional shortness of breath with exertion.  Patient also has right upper quadrant abdominal discomfort since few months now.  She felt nauseous yesterday, no vomiting, normal appetite.  Denies any fever chills or rigors.  No cough.  Patient has been following oncologist Dr. Gudena, recently recently decreased Xeloda  dose due to elevated alkaline phosphatase levels.     In the emergency department, patient was noted to have tachycardia, low blood pressures, hypoxia requiring 2 L supplemental oxygen.  Laboratory findings showed alk phos of 1446 worsened from 902 on 08/09/2024, total bili 1.8, white count 17.6.  CT angio chest abdomen pelvis done showed no evidence of pulmonary embolism, moderate right pleural effusion, progression of innumerable pulmonary mets, bilobar liver mets, increased soft tissue thickening about ileocecal valve concerning for neoplasm.  ED provider discussed with oncologist, requested hospitalist admission for further management evaluation of generalized weakness, hypoxia.    PT Comments  Pt friendly and willing to participate with therapy today.  Pt required mod A with bed mobility with cueing for rolling to the side and reaching for handrails to assist.  Improved seated balance with no assistance required.  Pt presents with improve strength as was able to stand with mod A and complete small steps  front of bed.  Cueing required for hand placement to assist with standing, cueing for posture to assist with LBP and balance during gait.  Seated therapeutic exercises complete for LE strengthening.  Vitals assessed during session with cueing for deep breathing to increase O2 saturation from 88-93%, also educated on deep breathing can help calm CNS to assist with HR as did increase to 140 during gait.  EOS pt requested to return to bed as feels chair in room is uncomfortable (friend in room told her it was hard).  EOS pt left in bed with call bell within reach and chair alarm set.   If plan is discharge home, recommend the following:     Can travel by private vehicle        Equipment Recommendations       Recommendations for Other Services       Precautions / Restrictions Precautions Precautions: Fall Recall of Precautions/Restrictions: Intact     Mobility  Bed Mobility Overal bed mobility: Needs Assistance Bed Mobility: Supine to Sit     Supine to sit: Max assist, HOB elevated     General bed mobility comments: needs more than 75% assist for legs to bring off the edge of the bed    Transfers Overall transfer level: Needs assistance Equipment used: Rolling walker (2 wheels) Transfers: Sit to/from Stand Sit to Stand: Mod assist           General transfer comment: Elevated bed height slightly, cueing for handplacement to assist with standing, RW for stability upon standing    Ambulation/Gait Ambulation/Gait assistance: Mod assist Gait Distance (Feet): 4 Feet Assistive device: Rolling walker (2 wheels)         General Gait  Details: Sidestep front of bed R and Lt with RW assistance   Stairs             Wheelchair Mobility     Tilt Bed    Modified Rankin (Stroke Patients Only)       Balance                                            Communication Communication Communication: No apparent difficulties  Cognition Arousal:  Alert Behavior During Therapy: WFL for tasks assessed/performed                             Following commands: Intact      Cueing    Exercises General Exercises - Lower Extremity Gluteal Sets: AROM, Both, 5 reps, Supine Long Arc Quad: AROM, Both, Strengthening, Seated Heel Slides: AROM, Both, 5 reps, Supine Hip ABduction/ADduction: AROM, Strengthening, Both, 10 reps, Seated Hip Flexion/Marching: AROM, Both, Seated, Strengthening (attempted marching, reports of increased LBP following 1st rep) Toe Raises: AROM, Strengthening, Both, 10 reps Heel Raises: AROM, Strengthening, Both, 10 reps    General Comments        Pertinent Vitals/Pain Pain Assessment Pain Score: 4  Pain Location: back, shoulders, hips Pain Descriptors / Indicators: Discomfort Pain Intervention(s): Monitored during session, Repositioned    Home Living                          Prior Function            PT Goals (current goals can now be found in the care plan section)      Frequency           PT Plan      Co-evaluation              AM-PAC PT 6 Clicks Mobility   Outcome Measure  Help needed turning from your back to your side while in a flat bed without using bedrails?: A Lot Help needed moving from lying on your back to sitting on the side of a flat bed without using bedrails?: A Lot Help needed moving to and from a bed to a chair (including a wheelchair)?: Total Help needed standing up from a chair using your arms (e.g., wheelchair or bedside chair)?: Total Help needed to walk in hospital room?: Total Help needed climbing 3-5 steps with a railing? : Total 6 Click Score: 8    End of Session Equipment Utilized During Treatment: Gait belt Activity Tolerance: Patient limited by fatigue Patient left: in bed;with call bell/phone within reach;with bed alarm set Nurse Communication: Mobility status PT Visit Diagnosis: Muscle weakness (generalized) (M62.81);Other  abnormalities of gait and mobility (R26.89);Difficulty in walking, not elsewhere classified (R26.2)     Time: 9086-9059 PT Time Calculation (min) (ACUTE ONLY): 27 min  Charges:    $Therapeutic Activity: 23-37 mins PT General Charges $$ ACUTE PT VISIT: 1 Visit                    Augustin Mclean, LPTA/CLT; CBIS [phone]  Mclean Augustin Amble 09/02/2024, 11:10 AM

## 2024-09-02 NOTE — Progress Notes (Signed)
 " Progress Note   Patient: Barbara Parks FMW:[id number] DOB: 1951-03-03 DOA: 08/29/2024     3 DOS: the patient was seen and examined on 09/02/2024   Brief hospital course: Amyiah Gaba is a 74 y.o. female with medical history significant of metastatic breast cancer with liver involvement, status post chemotherapy right mastectomy, on Xeloda , hypertension, hyperlipidemia presented to the emergency department for evaluation of generalized weakness, fatigue, shortness of breath, nausea.  CTA chest abdomen pelvis revealed no pulm embolism, multiple pulmonary mets, bilobar liver mets.  Patient is requiring 2 L supplemental oxygen and will need further management and oncology evaluation.  Patient is transitioning to hospice-- not currently a candidate for residential.    Assessment and Plan:   Metastatic breast cancer-with dramatic progression of disease in the liver and likely T3 fracture with spinal cord compression.  Mets to lungs, liver, bone, possible colon. -stopped Xeloda .  Oncologist Dr. Odean spoke to her 08/30/24- due to rapidly progressing cancer advised hospice care, palliative team consulted-- plan to transition to DNR/hospice-- ? Residential hospice vs hospice at LTC/rehab   Hypotension - Required  albumin   - Will do a trial of midodrine   Hypoxia: In the setting of innumerable pulmonary mets, moderate right pleural effusion, atelectasis. Continue duonebs q6h PRN.  Encourage incentive spirometry. She got thoracentesis 2 weeks ago.  Continue supplemental oxygen to maintain saturation greater than 92%.   Elevated alk phos- In the setting of bilobar liver mets, chemotherapy. Alk phos today improved to 1017 from 1446. stop Xeloda . Oncology team advised hospice care.   Leukocytosis- Seems chronic with neutrophilia.   Hypertension: Blood pressure lower side.  Hold antihypertensives.   Left greater than right pedal edema: DVT study unremarkable. Patient has prior history of  clot with catheter, not currently on Xarelto . Continue Lovenox  for DVT prophylaxis.    DVT prophylaxis: enoxaparin  (LOVENOX ) injection 40 mg Start: 08/29/24 1800 SCDs Start: 08/29/24 1342  Level of care: Med-Surg   Code Status: Limited: Do not attempt resuscitation (DNR) -DNR-LIMITED -Do Not Intubate/DNI   Subjective: Working with PT to stand this AM No overnight issues  Physical Exam: Vitals:   09/01/24 1724 09/01/24 2125 09/02/24 0059 09/02/24 0536  BP: (!) 82/64 (!) 98/59 125/78 102/60  Pulse: (!) 117 (!) 122 (!) 123 (!) 115  Resp: 20 20 17 17   Temp: 98.4 F (36.9 C) 98.2 F (36.8 C) 98 F (36.7 C) 98.3 F (36.8 C)  TempSrc: Oral Oral Oral Oral  SpO2: 95% 96% 93% 94%  Weight:      Height:        General: Appearance:     Overweight female in no acute distress     Lungs:     In [place]  Heart:    Tachycardic.  MS:   All extremities are intact.   Neurologic:   Awake, alert, oriented x 3. No apparent focal neurological           defect.      Data Reviewed:      Latest Ref Rng & Units 08/30/2024    4:00 AM 08/29/2024   10:58 AM 08/09/2024   11:06 AM  CBC  WBC 4.0 - 10.5 K/uL 14.7  17.6  18.2   Hemoglobin 12.0 - 15.0 g/dL 88.7  86.9  87.0   Hematocrit 36.0 - 46.0 % 36.0  43.2  40.3   Platelets 150 - 400 K/uL 280  346  409       Latest Ref Rng & Units  08/30/2024    4:00 AM 08/29/2024   10:58 AM 08/09/2024   11:06 AM  BMP  Glucose 70 - 99 mg/dL 71  884  862   BUN 8 - 23 mg/dL 16  16  14    Creatinine 0.44 - 1.00 mg/dL 9.43  9.23  9.08   Sodium 135 - 145 mmol/L 139  137  133   Potassium 3.5 - 5.1 mmol/L 3.8  3.9  3.8   Chloride 98 - 111 mmol/L 99  96  97   CO2 22 - 32 mmol/L 20  14  15    Calcium  8.9 - 10.3 mg/dL 8.3  8.8  8.4    No results found.   Family Communication: Discussed with patient, understand and agree. All questions answered.  Disposition: Status is: Inpatient  Needs safe d/c plan-- hospice but not able to go to residential   Time spent: 44  minutes  Author: Deona Novitski U Shahzain Kiester, DO 09/02/2024 10:32 AM Secure chat 7am to 7pm For on call review [email or web address].    "

## 2024-09-02 NOTE — Plan of Care (Signed)

## 2024-09-02 NOTE — TOC Progression Note (Addendum)
 Transition of Care [facility]) - Progression Note    Patient Details  Name: Barbara Parks MRN: [id number] Date of Birth: 06/08/1951  Transition of Care [facility]) CM/SW Contact  Sharlyne Stabs, RN Phone Number: 09/02/2024, 4:03 PM  Clinical Narrative:   Patient is agreeable to SNF. She lives alone and can not go home alone. PASSR# [id number] . [facility] offered, Per Marval they will have a bed tomorrow. CM at the bedside to discuss with patient. She is agreeable. CM updated Debbie patient may qualify for medicaid and VA benefits. Marval will update SW.  She will go to [place] with Palliative services.   Referral sent to [facility] [employer] and Human resources officer to screen for [facility] drug stores.   Expected Discharge Plan: Hospice Medical Facility Barriers to Discharge: Continued Medical Work up, Hospice Bed not available, SNF Pending bed offer    Expected Discharge Plan and Services In-house Referral: Hospice / Palliative Care   Post Acute Care Choice: Skilled Nursing Facility, Hospice Living arrangements for the past 2 months: Single Family Home                 DME Arranged: N/A      Social Drivers of Health (SDOH) Interventions SDOH Screenings   Food Insecurity: No Food Insecurity (08/29/2024)  Housing: Low Risk (08/29/2024)  Transportation Needs: No Transportation Needs (08/29/2024)  Utilities: Not At Risk (08/29/2024)  Depression (PHQ2-9): Low Risk (05/10/2024)  Social Connections: Socially Isolated (08/29/2024)  Tobacco Use: Medium Risk (08/29/2024)

## 2024-09-02 NOTE — Plan of Care (Signed)
  Problem: Education: Goal: Knowledge of General Education information will improve Description: Including pain rating scale, medication(s)/side effects and non-pharmacologic comfort measures Outcome: Adequate for Discharge   Problem: Clinical Measurements: Goal: Ability to maintain clinical measurements within normal limits will improve Outcome: Adequate for Discharge Goal: Diagnostic test results will improve Outcome: Adequate for Discharge

## 2024-09-02 NOTE — Progress Notes (Signed)
 [facility]  Hospice hospital liaison note   Received request from [facility] for patient interest in hospice inpatient unit.    Chart reviewed and at this time patient does not meet criteria for hospice inpatient unit.    She is appropriate for hospice services in the home or LTC facility. We will follow to assess for decline and inpatient hospice appropriateness if requested.   Thank you for the opportunity to participate in this patient's care.    Eleanor Nail, LPN Hospice hospital liaison [phone]

## 2024-09-02 NOTE — [place] FL2 (Signed)
 " [place]  MEDICAID FL2 LEVEL OF CARE FORM     IDENTIFICATION  Patient Name: Barbara Parks Birthdate: [birth year] Sex: female Admission Date (Current Location): 08/29/2024  [facility] and [place] Number:  [employer] and Address:  [facility],  [address], [place] [address]      Provider Number: [id number]  Attending Physician Name and Address:  Juvenal Harlene PENNER, DO  Relative Name and Phone Number:  Daren Meade Ahumada,  [phone]    Current Level of Care: Hospital Recommended Level of Care: Skilled Nursing Facility Prior Approval Number:    Date Approved/Denied:   PASRR Number: [id number]  Discharge Plan: SNF    Current Diagnoses: Patient Active Problem List   Diagnosis Date Noted   Generalized weakness 08/29/2024   Hypoxia 08/29/2024   Elevated alkaline phosphatase level 08/29/2024   Metastasis from breast cancer (HCC) 08/29/2024   Leukocytosis 08/29/2024   Chest pain, unspecified 06/25/2023   Thrombus 06/24/2023   Port-A-Cath in place 06/13/2022   Malignant neoplasm metastatic to liver (HCC) 08/09/2019   Malignant neoplasm metastatic to bone (HCC) 12/23/2018   Goals of care, counseling/discussion 12/23/2018   Hyperglycemia 10/02/2017   Hepatic steatosis 01/23/2017   OA (osteoarthritis) of hip 06/07/2015   Elevated cholesterol with high triglycerides 03/22/2015   Malignant neoplasm of upper-outer quadrant of right breast in female, estrogen receptor positive (HCC) 08/13/2014   Thoracic aorta atherosclerosis 08/13/2014   Proteinuria 06/16/2014   Hypercalcemia 06/16/2014   Fatigue 04/21/2014   ADHD (attention deficit hyperactivity disorder)    Osteopenia due to cancer therapy 09/09/2013   Carcinoma of right breast metastatic to lung (HCC) 10/10/2011   Malignant pericardial effusion (HCC) 09/10/2011   Pleural effusion 09/10/2011   Pneumonia 08/26/2011   Asthma in adult without complication 08/20/2010   Osteoarthritis of left  hip 08/17/2010    Orientation RESPIRATION BLADDER Height & Weight     Self, Time, Situation, Place  O2 (O2 2L [place]) Incontinent, External catheter Weight: 68.6 kg Height:  5' 4 (162.6 cm)  BEHAVIORAL SYMPTOMS/MOOD NEUROLOGICAL BOWEL NUTRITION STATUS      Continent Diet  AMBULATORY STATUS COMMUNICATION OF NEEDS Skin   Extensive Assist Verbally Other (Comment), Bruising (Scattered ecchymosis and erythema or upper & lower extremeties)                       Personal Care Assistance Level of Assistance  Bathing, Dressing Bathing Assistance: Limited assistance   Dressing Assistance: Limited assistance     Functional Limitations Info  Sight, Hearing Sight Info: Impaired (wears glasses) Hearing Info: Impaired      SPECIAL CARE FACTORS FREQUENCY  PT (By licensed PT), OT (By licensed OT)     PT Frequency: 5x weekly OT Frequency: 5x weekly            Contractures Contractures Info: Not present    Additional Factors Info  Code Status, Allergies Code Status Info: DNR Allergies Info: Aspirin, latex, Nsaids           Current Medications (09/02/2024):  This is the current hospital active medication list Current Facility-Administered Medications  Medication Dose Route Frequency Provider Last Rate Last Admin   acetaminophen  (TYLENOL ) tablet 650 mg  650 mg Oral Q6H PRN Vann, Jessica U, DO       alum & mag hydroxide-simeth (MAALOX/MYLANTA) 200-200-20 MG/5ML suspension 30 mL  30 mL Oral Q4H PRN Darci Pore, MD   30 mL at 09/01/24 2131   enoxaparin  (LOVENOX )  injection 40 mg  40 mg Subcutaneous Q24H Sreeram, Narendranath, MD   40 mg at 09/01/24 1740   insulin  aspart (novoLOG ) injection 0-5 Units  0-5 Units Subcutaneous QHS Sreeram, Narendranath, MD       insulin  aspart (novoLOG ) injection 0-6 Units  0-6 Units Subcutaneous TID WC Sreeram, Narendranath, MD       ipratropium-albuterol  (DUONEB) 0.5-2.5 (3) MG/3ML nebulizer solution 3 mL  3 mL Nebulization Q4H PRN Darci Pore, MD       loperamide  (IMODIUM ) capsule 2 mg  2 mg Oral QID PRN Sreeram, Narendranath, MD       midodrine  (PROAMATINE ) tablet 2.5 mg  2.5 mg Oral TID WC Vann, Jessica U, DO   2.5 mg at 09/02/24 1129   morphine  (PF) 2 MG/ML injection 2 mg  2 mg Intravenous Q4H PRN Sreeram, Narendranath, MD       multivitamin with minerals tablet 1 tablet  1 tablet Oral Daily Sreeram, Narendranath, MD   1 tablet at 09/02/24 9161   ondansetron  (ZOFRAN ) tablet 4 mg  4 mg Oral Q6H PRN Darci Pore, MD       Or   ondansetron  (ZOFRAN ) injection 4 mg  4 mg Intravenous Q6H PRN Darci Pore, MD       polyethylene glycol (MIRALAX  / GLYCOLAX ) packet 17 g  17 g Oral Daily PRN Vann, Jessica U, DO       prochlorperazine  (COMPAZINE ) tablet 10 mg  10 mg Oral Q6H PRN Darci Pore, MD       traMADol  (ULTRAM ) tablet 50 mg  50 mg Oral QHS PRN Sreeram, Narendranath, MD   50 mg at 09/01/24 2131     Discharge Medications: Please see discharge summary for a list of discharge medications.  Relevant Imaging Results:  Relevant Lab Results:   Additional Information SS#[id number]  Sharlyne Stabs, RN     "

## 2024-09-03 LAB — GLUCOSE, CAPILLARY
Glucose-Capillary: 99 mg/dL (ref 70–99)
Glucose-Capillary: 116 mg/dL — ABNORMAL HIGH (ref 70–99)

## 2024-09-03 MED ORDER — TRAMADOL HCL 50 MG PO TABS: 50.0000 mg | ORAL_TABLET | Freq: Four times a day (QID) | ORAL | Status: DC | PRN

## 2024-09-03 MED ORDER — POLYETHYLENE GLYCOL 3350 17 G PO PACK: 17.0000 g | PACK | Freq: Every day | ORAL | Status: AC | PRN

## 2024-09-03 MED ORDER — ADULT MULTIVITAMIN W/MINERALS CH: 1.0000 | ORAL_TABLET | Freq: Every day | ORAL | Status: AC

## 2024-09-03 MED ORDER — ACETAMINOPHEN 325 MG PO TABS: 650.0000 mg | ORAL_TABLET | Freq: Four times a day (QID) | ORAL | Status: AC | PRN

## 2024-09-03 MED ORDER — MIDODRINE HCL 5 MG PO TABS
5.0000 mg | ORAL_TABLET | Freq: Three times a day (TID) | ORAL | Status: DC
  Administered 2024-09-03 (×2): 5 mg via ORAL
  Filled 2024-09-03 (×2): qty 1

## 2024-09-03 MED ORDER — IPRATROPIUM-ALBUTEROL 0.5-2.5 (3) MG/3ML IN SOLN: 3.0000 mL | RESPIRATORY_TRACT | Status: AC | PRN

## 2024-09-03 MED ORDER — GUAIFENESIN-DM 100-10 MG/5ML PO SYRP: 5.0000 mL | ORAL_SOLUTION | ORAL | Status: DC | PRN

## 2024-09-03 MED ORDER — CARVEDILOL 3.125 MG PO TABS: 3.1250 mg | ORAL_TABLET | Freq: Two times a day (BID) | ORAL | Status: AC

## 2024-09-03 MED ORDER — MIDODRINE HCL 5 MG PO TABS: 5.0000 mg | ORAL_TABLET | Freq: Three times a day (TID) | ORAL | Status: AC

## 2024-09-03 MED ORDER — TRAMADOL HCL 50 MG PO TABS: 50.0000 mg | ORAL_TABLET | Freq: Four times a day (QID) | ORAL | 0 refills | Status: AC | PRN

## 2024-09-03 NOTE — Care Management Important Message (Signed)
 Important Message  Patient Details  Name: Barbara Parks MRN: [id number] Date of Birth: November 13, 1950   Important Message Given:  Yes - Medicare IM     Andrian Sabala L Dontrail Blackwell 09/03/2024, 1:12 PM

## 2024-09-03 NOTE — Discharge Summary (Signed)
 "     Physician Discharge Summary  Barbara Parks FMW:[id number] DOB: 19-Sep-1950 DOA: 08/29/2024  PCP: Jeffrie Oneil BROCKS, MD  Admit date: 08/29/2024 Discharge date: 09/03/2024  Admitted From:  Discharge disposition: SNF   Recommendations for Outpatient Follow-Up:   SNF placement with palliative following-plan to transition to hospice/comfort care if patient worsens preferentially residential hospice will be needed 2L O2 Holding parameters on Coreg -do not give if systolic blood pressure less than 100 Patient on midodrine -titrate as needed   Discharge Diagnosis:   Principal Problem:   Generalized weakness Active Problems:   Hypoxia   Elevated alkaline phosphatase level   Metastasis from breast cancer (HCC)   Leukocytosis     Diet recommendation:  Regular.  Wound care: None.  Code status: DNR   History of Present Illness:    Barbara Parks is a 74 y.o. female with medical history significant of metastatic breast cancer with liver involvement, status post chemotherapy right mastectomy, on Xeloda , hypertension, hyperlipidemia presented to the emergency department for evaluation of generalized weakness.  Patient has been feeling fatigue for almost a month but since last 2 weeks has noticed that she is unable to get out of bed and ambulate.  Patient did mention occasional shortness of breath with exertion.  Patient also has right upper quadrant abdominal discomfort since few months now.  She felt nauseous yesterday, no vomiting, normal appetite.  Denies any fever chills or rigors.  No cough.  Patient has been following oncologist Dr. Gudena, recently recently decreased Xeloda  dose due to elevated alkaline phosphatase levels.   In the emergency department, patient was noted to have tachycardia, low blood pressures, hypoxia requiring 2 L supplemental oxygen.  Laboratory findings showed alk phos of 1446 worsened from 902 on 08/09/2024, total bili 1.8, white count 17.6.  CT angio  chest abdomen pelvis done showed no evidence of pulmonary embolism, moderate right pleural effusion, progression of innumerable pulmonary mets, bilobar liver mets, increased soft tissue thickening about ileocecal valve concerning for neoplasm.  ED provider discussed with oncologist, requested hospitalist admission for further management evaluation of generalized weakness, hypoxia.   Hospital Course by Problem:   Metastatic breast cancer-with dramatic progression of disease in the liver and likely T3 fracture with spinal cord compression.  Mets to lungs, liver, bone, possible colon. -stopped Xeloda .  Oncologist Dr. Odean spoke to her 08/30/24- due to rapidly progressing cancer advised hospice care, palliative team consulted-- plan to transition to hospice  Hypotension - Required  albumin   - Responded well to midodrine    Hypoxia: In the setting of innumerable pulmonary mets, moderate right pleural effusion, atelectasis. Continue duonebs q6h PRN.  Encourage incentive spirometry. She got thoracentesis 2 weeks ago.  Continue supplemental oxygen to maintain saturation greater than 92%.   Elevated alk phos- In the setting of bilobar liver mets, chemotherapy. Alk phos today improved to 1017 from 1446. stop Xeloda . Oncology team advised hospice care.   Leukocytosis- Seems chronic with neutrophilia.   Hypertension: Blood pressure lower side Holding parameter on Coreg    Left greater than right pedal edema: DVT study unremarkable.      Medical Consultants:   Palliative care   Discharge Exam:   Vitals:   09/03/24 1050  BP: (!) 94/58  Pulse: (!) 108  Resp: 18  Temp:   SpO2:      General exam: Appears calm and comfortable.   The results of significant diagnostics from this hospitalization (including imaging, microbiology, ancillary and laboratory) are listed below for reference.  Procedures and Diagnostic Studies:   US  Venous Img Lower Bilateral (DVT) Result Date:  08/30/2024 CLINICAL DATA:  Bilateral lower extremity edema EXAM: BILATERAL LOWER EXTREMITY VENOUS DOPPLER ULTRASOUND TECHNIQUE: Gray-scale sonography with graded compression, as well as color Doppler and duplex ultrasound were performed to evaluate the lower extremity deep venous systems from the level of the common femoral vein and including the common femoral, femoral, profunda femoral, popliteal and calf veins including the posterior tibial, peroneal and gastrocnemius veins when visible. Spectral Doppler was utilized to evaluate flow at rest and with distal augmentation maneuvers in the common femoral, femoral and popliteal veins. COMPARISON:  None Available. FINDINGS: RIGHT LOWER EXTREMITY Common Femoral Vein: No evidence of thrombus. Normal compressibility, respiratory phasicity and response to augmentation. Saphenofemoral Junction: No evidence of thrombus. Normal compressibility and flow on color Doppler imaging. Profunda Femoral Vein: No evidence of thrombus. Normal compressibility and flow on color Doppler imaging. Femoral Vein: No evidence of thrombus. Normal compressibility, respiratory phasicity and response to augmentation. Popliteal Vein: No evidence of thrombus. Normal compressibility, respiratory phasicity and response to augmentation. Calf Veins: No evidence of thrombus. Normal compressibility and flow on color Doppler imaging. LEFT LOWER EXTREMITY Common Femoral Vein: No evidence of thrombus. Normal compressibility, respiratory phasicity and response to augmentation. Saphenofemoral Junction: No evidence of thrombus. Normal compressibility and flow on color Doppler imaging. Profunda Femoral Vein: No evidence of thrombus. Normal compressibility and flow on color Doppler imaging. Femoral Vein: No evidence of thrombus. Normal compressibility, respiratory phasicity and response to augmentation. Popliteal Vein: No evidence of thrombus. Normal compressibility, respiratory phasicity and response to  augmentation. Calf Veins: No evidence of thrombus. Normal compressibility and flow on color Doppler imaging. IMPRESSION: No evidence of deep venous thrombosis in either lower extremity. Electronically Signed   By: CHRISTELLA.  Shick M.D.   On: 08/30/2024 10:36   CT ABDOMEN PELVIS W CONTRAST Result Date: 08/29/2024 EXAM: CTA CHEST PE WITHOUT AND WITH CONTRAST CT ABDOMEN AND PELVIS WITHOUT AND WITH CONTRAST 08/29/2024 12:47:08 PM TECHNIQUE: CTA of the chest was performed after the administration of 100 mL of iohexol  (OMNIPAQUE ) 350 MG/ML injection. Multiplanar reformatted images are provided for review. MIP images are provided for review. CT of the abdomen and pelvis was performed without and with the administration of intravenous contrast. Automated exposure control, iterative reconstruction, and/or weight based adjustment of the mA/kV was utilized to reduce the radiation dose to as low as reasonably achievable. COMPARISON: 06/30/2024 CLINICAL HISTORY: 74 year old female with history of stage 4 carcinoma of the right breast. Patient presents with weakness. Pulmonary embolism (PE) suspected, high probability. FINDINGS: CHEST: PULMONARY ARTERIES: Pulmonary arteries are adequately opacified for evaluation. No intraluminal filling defect to suggest pulmonary embolism. Main pulmonary artery is normal in caliber. MEDIASTINUM: Mild cardiac enlargement. No pericardial effusion. No enlarged mediastinal lymph nodes. The left hilar lymph node measures 1 cm and is unchanged from the previous exam. Right hilar lymph node is also unchanged measuring 1 cm. No enlarged axillary or supraclavicular lymph nodes. There is no acute abnormality of the thoracic aorta. LUNGS AND PLEURA: Innumerable lung nodules are scattered throughout the lungs compatible with metastatic disease. Increase in multiplicity and size from previous exam. Index left lower lobe lung nodule measures 0.7 cm (image 108/10), previously 0.3 cm. An index subpleural nodule  over the left upper lobe measures 7 mm (image 71/10), previously 3 mm. An index nodule within the right upper lobe is new, measuring 3 mm (image 67/10). A right mid lung perihilar nodule measures 1.1  cm (image 86/10), previously 0.8 cm. Moderate right pleural effusion with posterior pleural thickening and chronic atelectasis involving the right lower lobe. No pneumothorax. SOFT TISSUES AND BONES: Multifocal mixed lytic and sclerotic bone lesions are again noted and appear similar to the previous exam. Lesions are noted within the thoracic spine, bilateral ribs, and bilateral scapulae. No acute soft tissue abnormality. ABDOMEN AND PELVIS: LIVER: Bilobar liver metastases are again noted; these are too numerous to count. Dominant lesion or confluence of lesions within segment 5 has significantly increased in the interval measuring 9.5 x 7.3 cm (image 37/2), previously 5.5 x 3.9 cm. The index lesion within the lateral segment of the left lobe of the liver measures 3.0 x 2.7 cm versus 2.5 x 2.5 cm previously. GALLBLADDER AND BILE DUCTS: Gallbladder is unremarkable. No biliary ductal dilatation. SPLEEN: The spleen is within normal limits in size and appearance. PANCREAS: Pancreas is normal in size and contour without focal lesion or ductal dilatation. ADRENAL GLANDS: Normal right adrenal gland. Unchanged left adrenal nodule measuring 1.5 cm, previously characterized as a benign adenoma (image 31/2). KIDNEYS, URETERS AND BLADDER: No stones in the kidneys or ureters. No hydronephrosis. No perinephric or periureteral stranding. Urinary bladder is unremarkable. GI AND BOWEL: The stomach is normal. The appendix is visualized and normal in caliber, without wall thickening, periappendiceal inflammation, or fluid. There is increased soft tissue thickening about the ileocecal valve measuring 3.3 x 3.0 cm (image 61/4). There is no bowel obstruction. No abnormal bowel wall thickening or distension. REPRODUCTIVE: Normal appearance of  the uterus and adnexal structures. PERITONEUM AND RETROPERITONEUM: No ascites or free air. No peritoneal nodularity. LYMPH NODES: Several small ileocolic lymph nodes are noted within the right lower quadrant (image 58/4). These measure up to 5 mm in short axis. No retroperitoneal or pelvic lymph nodes. BONES AND SOFT TISSUES: Multifocal mixed lytic and sclerotic bone metastases are again noted. Destructive lesion involving the posterior aspect of the L3 vertebral body is again noted with loss of the cortex along the posterior aspect of the vertebral body, similar to the previous exam. Left hip arthroplasty. Aortic atherosclerotic calcification. No focal soft tissue abnormality. IMPRESSION: 1. No evidence of pulmonary embolism. 2. Moderate right pleural effusion with posterior pleural thickening and chronic atelectasis involving the right lower lobe. Stable from previous exam. 3. Progression of innumerable pulmonary metastases. 4. Progression of bilobar liver metastases, with dominant lesion/confluence in segment 5 measuring 9.5 x 7.3 cm, previously 5.5 x 3.9 cm. 5. Increased soft tissue thickening about the ileocecal valve measuring 3.3 x 3.0 cm with several small ileocolic lymph nodes, most concerning for neoplasm (primary colonic malignancy versus metastatic disease), with infectious/inflammatory ileocecitis considered less likely; recommend colonoscopy/tissue sampling. Electronically signed by: Waddell Calk MD 08/29/2024 01:19 PM EST RP Workstation: [id number]   CT Angio Chest PE W and/or Wo Contrast Result Date: 08/29/2024 EXAM: CTA CHEST PE WITHOUT AND WITH CONTRAST CT ABDOMEN AND PELVIS WITHOUT AND WITH CONTRAST 08/29/2024 12:47:08 PM TECHNIQUE: CTA of the chest was performed after the administration of 100 mL of iohexol  (OMNIPAQUE ) 350 MG/ML injection. Multiplanar reformatted images are provided for review. MIP images are provided for review. CT of the abdomen and pelvis was performed without and with the  administration of intravenous contrast. Automated exposure control, iterative reconstruction, and/or weight based adjustment of the mA/kV was utilized to reduce the radiation dose to as low as reasonably achievable. COMPARISON: 06/30/2024 CLINICAL HISTORY: 74 year old female with history of stage 4 carcinoma of the right breast. Patient  presents with weakness. Pulmonary embolism (PE) suspected, high probability. FINDINGS: CHEST: PULMONARY ARTERIES: Pulmonary arteries are adequately opacified for evaluation. No intraluminal filling defect to suggest pulmonary embolism. Main pulmonary artery is normal in caliber. MEDIASTINUM: Mild cardiac enlargement. No pericardial effusion. No enlarged mediastinal lymph nodes. The left hilar lymph node measures 1 cm and is unchanged from the previous exam. Right hilar lymph node is also unchanged measuring 1 cm. No enlarged axillary or supraclavicular lymph nodes. There is no acute abnormality of the thoracic aorta. LUNGS AND PLEURA: Innumerable lung nodules are scattered throughout the lungs compatible with metastatic disease. Increase in multiplicity and size from previous exam. Index left lower lobe lung nodule measures 0.7 cm (image 108/10), previously 0.3 cm. An index subpleural nodule over the left upper lobe measures 7 mm (image 71/10), previously 3 mm. An index nodule within the right upper lobe is new, measuring 3 mm (image 67/10). A right mid lung perihilar nodule measures 1.1 cm (image 86/10), previously 0.8 cm. Moderate right pleural effusion with posterior pleural thickening and chronic atelectasis involving the right lower lobe. No pneumothorax. SOFT TISSUES AND BONES: Multifocal mixed lytic and sclerotic bone lesions are again noted and appear similar to the previous exam. Lesions are noted within the thoracic spine, bilateral ribs, and bilateral scapulae. No acute soft tissue abnormality. ABDOMEN AND PELVIS: LIVER: Bilobar liver metastases are again noted; these are  too numerous to count. Dominant lesion or confluence of lesions within segment 5 has significantly increased in the interval measuring 9.5 x 7.3 cm (image 37/2), previously 5.5 x 3.9 cm. The index lesion within the lateral segment of the left lobe of the liver measures 3.0 x 2.7 cm versus 2.5 x 2.5 cm previously. GALLBLADDER AND BILE DUCTS: Gallbladder is unremarkable. No biliary ductal dilatation. SPLEEN: The spleen is within normal limits in size and appearance. PANCREAS: Pancreas is normal in size and contour without focal lesion or ductal dilatation. ADRENAL GLANDS: Normal right adrenal gland. Unchanged left adrenal nodule measuring 1.5 cm, previously characterized as a benign adenoma (image 31/2). KIDNEYS, URETERS AND BLADDER: No stones in the kidneys or ureters. No hydronephrosis. No perinephric or periureteral stranding. Urinary bladder is unremarkable. GI AND BOWEL: The stomach is normal. The appendix is visualized and normal in caliber, without wall thickening, periappendiceal inflammation, or fluid. There is increased soft tissue thickening about the ileocecal valve measuring 3.3 x 3.0 cm (image 61/4). There is no bowel obstruction. No abnormal bowel wall thickening or distension. REPRODUCTIVE: Normal appearance of the uterus and adnexal structures. PERITONEUM AND RETROPERITONEUM: No ascites or free air. No peritoneal nodularity. LYMPH NODES: Several small ileocolic lymph nodes are noted within the right lower quadrant (image 58/4). These measure up to 5 mm in short axis. No retroperitoneal or pelvic lymph nodes. BONES AND SOFT TISSUES: Multifocal mixed lytic and sclerotic bone metastases are again noted. Destructive lesion involving the posterior aspect of the L3 vertebral body is again noted with loss of the cortex along the posterior aspect of the vertebral body, similar to the previous exam. Left hip arthroplasty. Aortic atherosclerotic calcification. No focal soft tissue abnormality. IMPRESSION: 1. No  evidence of pulmonary embolism. 2. Moderate right pleural effusion with posterior pleural thickening and chronic atelectasis involving the right lower lobe. Stable from previous exam. 3. Progression of innumerable pulmonary metastases. 4. Progression of bilobar liver metastases, with dominant lesion/confluence in segment 5 measuring 9.5 x 7.3 cm, previously 5.5 x 3.9 cm. 5. Increased soft tissue thickening about the ileocecal valve  measuring 3.3 x 3.0 cm with several small ileocolic lymph nodes, most concerning for neoplasm (primary colonic malignancy versus metastatic disease), with infectious/inflammatory ileocecitis considered less likely; recommend colonoscopy/tissue sampling. Electronically signed by: Waddell Calk MD 08/29/2024 01:19 PM EST RP Workstation: [id number]   DG Chest Portable 1 View Result Date: 08/29/2024 EXAM: 1 VIEW(S) XRAY OF THE CHEST 08/29/2024 11:18:00 AM COMPARISON: CXR dated 08/16/2024 and CT Chest dated 06/29/2024. CLINICAL HISTORY: Fatigue. FINDINGS: LINES, TUBES AND DEVICES: Right IJ Port-A-Cath is stable with its tip just distal to the superior cavoatrial junction. LUNGS AND PLEURA: Small right pleural effusion. Right basilar opacities. Stable innumerable nodular-like densities throughout both lungs. No pneumothorax. HEART AND MEDIASTINUM: No acute abnormality of the cardiac and mediastinal silhouettes. BONES AND SOFT TISSUES: No acute osseous abnormality. IMPRESSION: 1. Stable innumerable nodular densities throughout both lungs. 2. Small right pleural effusion and right basilar opacities. Electronically signed by: Kate Plummer MD 08/29/2024 11:25 AM EST RP Workstation: [id number]     Labs:   Basic Metabolic Panel: Recent Labs  Lab 08/29/24 1058 08/30/24 0400  NA 137 139  K 3.9 3.8  CL 96* 99  CO2 14* 20*  GLUCOSE 115* 71  BUN 16 16  CREATININE 0.76 0.56  CALCIUM  8.8* 8.3*   GFR Estimated Creatinine Clearance: 59.6 mL/min (by C-G formula based on SCr of 0.56  mg/dL). Liver Function Tests: Recent Labs  Lab 08/29/24 1058 08/30/24 0400  AST 81* 65*  ALT 18 15  ALKPHOS 1,446* 1,017*  BILITOT 1.8* 1.7*  PROT 6.5 5.2*  ALBUMIN  3.1* 2.5*   No results for input(s): LIPASE, AMYLASE in the last 168 hours. No results for input(s): AMMONIA in the last 168 hours. Coagulation profile No results for input(s): INR, PROTIME in the last 168 hours.  CBC: Recent Labs  Lab 08/29/24 1058 08/30/24 0400  WBC 17.6* 14.7*  NEUTROABS 14.3* 10.9*  HGB 13.0 11.2*  HCT 43.2 36.0  MCV 98.6 96.3  PLT 346 280   Cardiac Enzymes: No results for input(s): CKTOTAL, CKMB, CKMBINDEX, TROPONINI in the last 168 hours. BNP: Invalid input(s): POCBNP CBG: Recent Labs  Lab 09/02/24 0736 09/02/24 1209 09/02/24 1631 09/02/24 1935 09/03/24 0736  GLUCAP 87 103* 99 117* 99   D-Dimer No results for input(s): DDIMER in the last 72 hours. Hgb A1c No results for input(s): HGBA1C in the last 72 hours. Lipid Profile No results for input(s): CHOL, HDL, LDLCALC, TRIG, CHOLHDL, LDLDIRECT in the last 72 hours. Thyroid function studies No results for input(s): TSH, T4TOTAL, T3FREE, THYROIDAB in the last 72 hours.  Invalid input(s): FREET3 Anemia work up No results for input(s): VITAMINB12, FOLATE, FERRITIN, TIBC, IRON, RETICCTPCT in the last 72 hours. Microbiology No results found for this or any previous visit (from the past 240 hours).   Discharge Instructions:   Discharge Instructions     Diet general   Complete by: As directed    Increase activity slowly   Complete by: As directed       Allergies as of 09/03/2024       Reactions   Aspirin    Upset stomach   Latex Other (See Comments)   Blistering and skin peels off   Nsaids Nausea And Vomiting   Extreme nausea and vomiting        Medication List     STOP taking these medications    albuterol  108 (90 Base) MCG/ACT inhaler Commonly  known as: VENTOLIN  HFA   capecitabine  500 MG tablet Commonly known as: XELODA   cyclobenzaprine  10 MG tablet Commonly known as: FLEXERIL        TAKE these medications    acetaminophen  325 MG tablet Commonly known as: TYLENOL  Take 2 tablets (650 mg total) by mouth every 6 (six) hours as needed for mild pain (pain score 1-3) or fever.   carvedilol  3.125 MG tablet Commonly known as: COREG  Take 1 tablet (3.125 mg total) by mouth 2 (two) times daily with a meal. HOLD SBP < 100 What changed: additional instructions   gemfibrozil  600 MG tablet Commonly known as: LOPID  Take 1 tablet (600 mg total) by mouth 2 (two) times daily before a meal.   ipratropium-albuterol  0.5-2.5 (3) MG/3ML Soln Commonly known as: DUONEB Take 3 mLs by nebulization every 4 (four) hours as needed.   lidocaine -prilocaine  cream Commonly known as: EMLA  Apply 1 Application topically as needed. Apply 1 dose as needed for port a cath access   loperamide  2 MG capsule Commonly known as: IMODIUM  Take 2 mg by mouth 4 (four) times daily as needed for diarrhea or loose stools. Reported on 11/30/2015   midodrine  5 MG tablet Commonly known as: PROAMATINE  Take 1 tablet (5 mg total) by mouth 3 (three) times daily with meals.   multivitamin with minerals Tabs tablet Take 1 tablet by mouth daily. Start taking on: September 04, 2024   polyethylene glycol 17 g packet Commonly known as: MIRALAX  / GLYCOLAX  Take 17 g by mouth daily as needed for mild constipation.   prochlorperazine  10 MG tablet Commonly known as: COMPAZINE  Take 1 tablet (10 mg total) by mouth every 6 (six) hours as needed for nausea or vomiting.   traMADol  50 MG tablet Commonly known as: ULTRAM  Take 1 tablet (50 mg total) by mouth every 6 (six) hours as needed for moderate pain (pain score 4-6). What changed: reasons to take this        Contact information for after-discharge care     Destination     [facility] .   Service: Skilled Nursing Contact information: [address] [place]  [address] [phone]                      Time coordinating discharge: 45 min  Signed:  Harlene RAYMOND Bowl DO  [facility] 09/03/2024, 10:54 AM      "

## 2024-09-03 NOTE — Progress Notes (Signed)
" °   09/03/24 1518  Spiritual Encounters  Type of Visit Initial  Care provided to: Pt not available  Conversation partners present during encounter Nurse  Referral source APP  Reason for visit End-of-life  OnCall Visit No  Interventions  Spiritual Care Interventions Made Compassionate presence;Prayer  Spiritual Care Plan  Spiritual Care Issues Still Outstanding Chaplain will continue to follow   Found patient asleep with niece and sister present bedside. Provided compassionate presence and prayer. They asked about transport and I had charge nurse reply to them. Will continue to follow as needed in order to provide spiritual support and to assess for spiritual need.   Rev. Ether Blush, [place]. Chaplain "

## 2024-09-03 NOTE — TOC Transition Note (Signed)
 Transition of Care [facility]) - Discharge Note   Patient Details  Name: Barbara Parks MRN: [id number] Date of Birth: Feb 14, 1951  Transition of Care [facility]) CM/SW Contact:  Hoy DELENA Bigness, LCSW Phone Number: 09/03/2024, 12:48 PM   Clinical Narrative:    Pt to discharge to [facility] for rehab. [facility] to follow for palliative care services. Pt will be going to room A2-1. RN to call report to [phone]. Medical necessity form printed to the unit. [facility] called at 12:53pm for transport   Final next level of care: Skilled Nursing Facility Barriers to Discharge: Barriers Resolved   Patient Goals and CMS Choice Patient states their goals for this hospitalization and ongoing recovery are:: Hospice Facility vs SNF for Rehab CMS Medicare.gov Compare Post Acute Care list provided to:: Patient Choice offered to / list presented to : Patient      Discharge Placement PASRR number recieved: 09/02/24            Patient chooses bed at: Other - please specify in the comment section below: [facility]) Patient to be transferred to facility by: [facility] Name of family member notified: Patient Patient and family notified of of transfer: 09/03/24  Discharge Plan and Services Additional resources added to the After Visit Summary for   In-house Referral: Hospice / Palliative Care   Post Acute Care Choice: Skilled Nursing Facility, Hospice          DME Arranged: N/A                    Social Drivers of Health (SDOH) Interventions SDOH Screenings   Food Insecurity: No Food Insecurity (08/29/2024)  Housing: Low Risk (08/29/2024)  Transportation Needs: No Transportation Needs (08/29/2024)  Utilities: Not At Risk (08/29/2024)  Depression (PHQ2-9): Low Risk (05/10/2024)  Social Connections: Socially Isolated (08/29/2024)  Tobacco Use: Medium Risk (08/29/2024)     Readmission Risk Interventions     No data to display

## 2024-09-08 ENCOUNTER — Inpatient Hospital Stay

## 2024-09-08 ENCOUNTER — Inpatient Hospital Stay: Attending: Hematology and Oncology

## 2024-09-08 ENCOUNTER — Inpatient Hospital Stay: Admitting: Pharmacist

## 2024-12-06 ENCOUNTER — Inpatient Hospital Stay

## 2024-12-06 ENCOUNTER — Inpatient Hospital Stay: Attending: Hematology and Oncology

## 2025-03-08 ENCOUNTER — Inpatient Hospital Stay

## 2025-03-08 ENCOUNTER — Inpatient Hospital Stay: Attending: Hematology and Oncology | Admitting: Hematology and Oncology
